# Patient Record
Sex: Female | Born: 1955
Health system: Southern US, Community
[De-identification: ages and names within clinical notes are randomized; demographics above are authoritative.]

## PROBLEM LIST (undated history)

## (undated) DIAGNOSIS — I10 Essential (primary) hypertension: Secondary | ICD-10-CM

## (undated) DIAGNOSIS — I509 Heart failure, unspecified: Secondary | ICD-10-CM

## (undated) DIAGNOSIS — I712 Thoracic aortic aneurysm, without rupture, unspecified: Secondary | ICD-10-CM

## (undated) DIAGNOSIS — R011 Cardiac murmur, unspecified: Secondary | ICD-10-CM

## (undated) DIAGNOSIS — Z8041 Family history of malignant neoplasm of ovary: Secondary | ICD-10-CM

## (undated) DIAGNOSIS — I700 Atherosclerosis of aorta: Secondary | ICD-10-CM

## (undated) DIAGNOSIS — D128 Benign neoplasm of rectum: Secondary | ICD-10-CM

## (undated) DIAGNOSIS — N183 Chronic kidney disease, stage 3 unspecified: Secondary | ICD-10-CM

## (undated) DIAGNOSIS — F1020 Alcohol dependence, uncomplicated: Secondary | ICD-10-CM

## (undated) DIAGNOSIS — K5792 Diverticulitis of intestine, part unspecified, without perforation or abscess without bleeding: Secondary | ICD-10-CM

## (undated) DIAGNOSIS — M1990 Unspecified osteoarthritis, unspecified site: Secondary | ICD-10-CM

## (undated) DIAGNOSIS — U071 COVID-19: Secondary | ICD-10-CM

## (undated) DIAGNOSIS — N200 Calculus of kidney: Secondary | ICD-10-CM

## (undated) HISTORY — DX: COVID-19: U07.1

## (undated) HISTORY — DX: Calculus of kidney: N20.0

## (undated) HISTORY — PX: TUBAL LIGATION: SHX77

## (undated) HISTORY — DX: Family history of malignant neoplasm of ovary: Z80.41

## (undated) HISTORY — DX: Benign neoplasm of rectum: D12.8

## (undated) HISTORY — DX: Thoracic aortic aneurysm, without rupture: I71.2

## (undated) HISTORY — DX: Heart failure, unspecified: I50.9

## (undated) HISTORY — DX: Chronic kidney disease, stage 3 unspecified: N18.30

## (undated) HISTORY — DX: Atherosclerosis of aorta: I70.0

## (undated) HISTORY — DX: Alcohol dependence, uncomplicated: F10.20

## (undated) HISTORY — DX: Thoracic aortic aneurysm, without rupture, unspecified: I71.20

---

## 1998-06-21 ENCOUNTER — Emergency Department (HOSPITAL_COMMUNITY): Admission: EM | Admit: 1998-06-21 | Discharge: 1998-06-21 | Payer: Self-pay | Admitting: Emergency Medicine

## 1999-08-26 ENCOUNTER — Encounter: Payer: Self-pay | Admitting: Emergency Medicine

## 1999-08-26 ENCOUNTER — Emergency Department (HOSPITAL_COMMUNITY): Admission: EM | Admit: 1999-08-26 | Discharge: 1999-08-26 | Payer: Self-pay | Admitting: Emergency Medicine

## 2000-12-03 ENCOUNTER — Emergency Department (HOSPITAL_COMMUNITY): Admission: EM | Admit: 2000-12-03 | Discharge: 2000-12-03 | Payer: Self-pay

## 2000-12-03 ENCOUNTER — Encounter: Payer: Self-pay | Admitting: Emergency Medicine

## 2001-01-06 ENCOUNTER — Encounter: Payer: Self-pay | Admitting: Emergency Medicine

## 2001-01-06 ENCOUNTER — Inpatient Hospital Stay (HOSPITAL_COMMUNITY): Admission: EM | Admit: 2001-01-06 | Discharge: 2001-01-10 | Payer: Self-pay | Admitting: Emergency Medicine

## 2001-01-07 ENCOUNTER — Encounter: Payer: Self-pay | Admitting: Family Medicine

## 2001-01-07 ENCOUNTER — Encounter: Payer: Self-pay | Admitting: Emergency Medicine

## 2001-01-08 ENCOUNTER — Encounter: Payer: Self-pay | Admitting: Family Medicine

## 2001-01-12 ENCOUNTER — Encounter: Admission: RE | Admit: 2001-01-12 | Discharge: 2001-01-12 | Payer: Self-pay | Admitting: Family Medicine

## 2002-11-12 ENCOUNTER — Encounter: Payer: Self-pay | Admitting: Emergency Medicine

## 2002-11-12 ENCOUNTER — Emergency Department (HOSPITAL_COMMUNITY): Admission: EM | Admit: 2002-11-12 | Discharge: 2002-11-12 | Payer: Self-pay | Admitting: Emergency Medicine

## 2005-03-02 ENCOUNTER — Inpatient Hospital Stay (HOSPITAL_COMMUNITY): Admission: RE | Admit: 2005-03-02 | Discharge: 2005-03-08 | Payer: Self-pay | Admitting: Psychiatry

## 2005-03-02 ENCOUNTER — Ambulatory Visit: Payer: Self-pay | Admitting: Psychiatry

## 2005-06-06 ENCOUNTER — Inpatient Hospital Stay (HOSPITAL_COMMUNITY): Admission: EM | Admit: 2005-06-06 | Discharge: 2005-06-15 | Payer: Self-pay | Admitting: Emergency Medicine

## 2005-06-06 ENCOUNTER — Ambulatory Visit: Payer: Self-pay | Admitting: Internal Medicine

## 2005-06-06 IMAGING — CT CT ABDOMEN W/ CM
1 of 3 series · 14 of 32 positions shown, 19 images · IV contrast (omnipaque)
Comparison: none

CLINICAL DATA: Abdominal pain, nausea, and vomiting.
 ABDOMEN CT WITH CONTRAST:
TECHNIQUE: Multidetector CT imaging of the abdomen was performed following the standard protocol during bolus administration of intravenous contrast.   Oral contrast was also administered. 
 Contrast:  135 cc Omnipaque 300 IV.
TECHNIQUE: Multidetector CT imaging of the pelvis was performed following the standard protocol during bolus administration of intravenous contrast.   Oral contrast was also administered.

[Series 3: abd/pelv with 5.0 b31f st · axial · 0.86mm/px · z∈[-438,-62]mm · 14 of 85 slices shown, 19 images]
[im 5/85  soft-tissue]
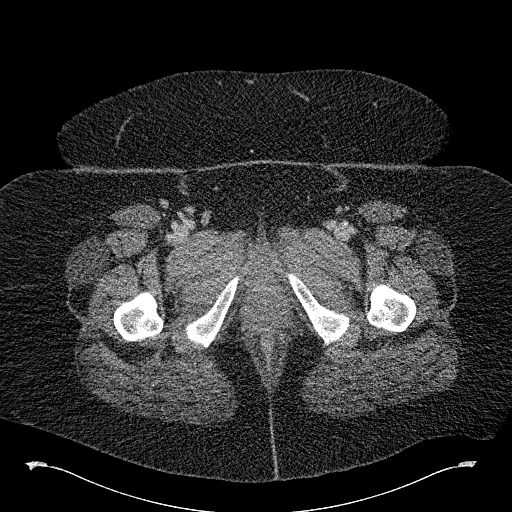
[im 5/85  bone]
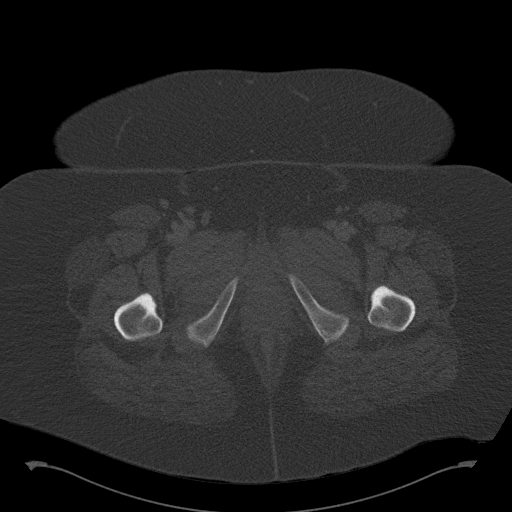
[im 13/85  soft-tissue]
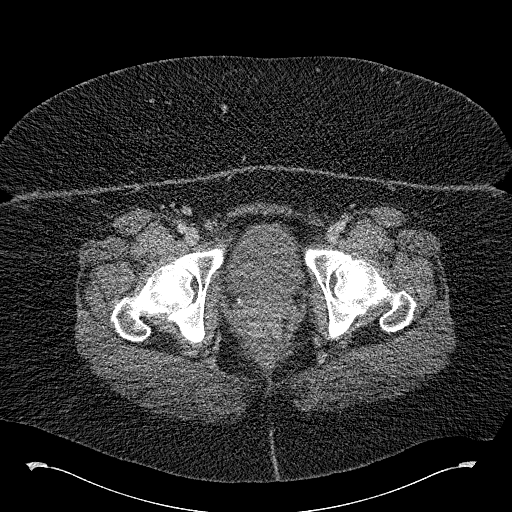
[im 17/85  soft-tissue]
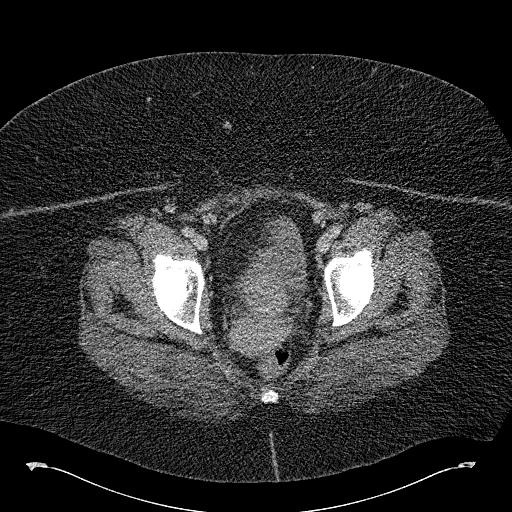
[im 26/85  soft-tissue]
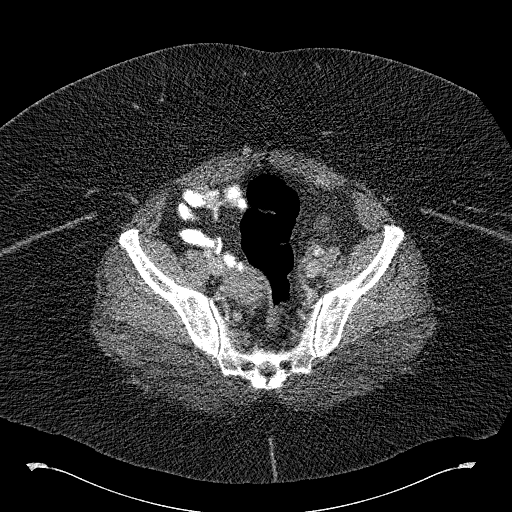
[im 30/85  soft-tissue]
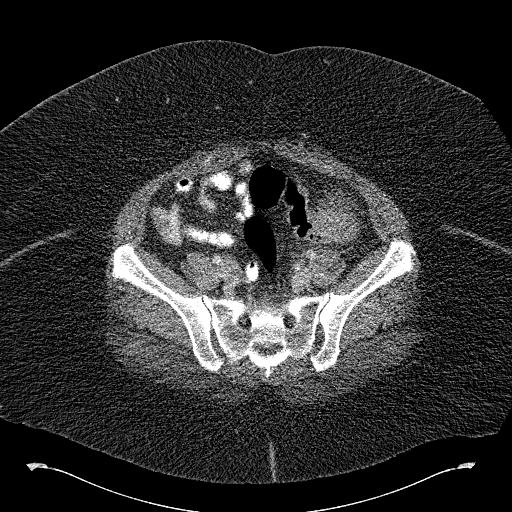
[im 38/85  soft-tissue]
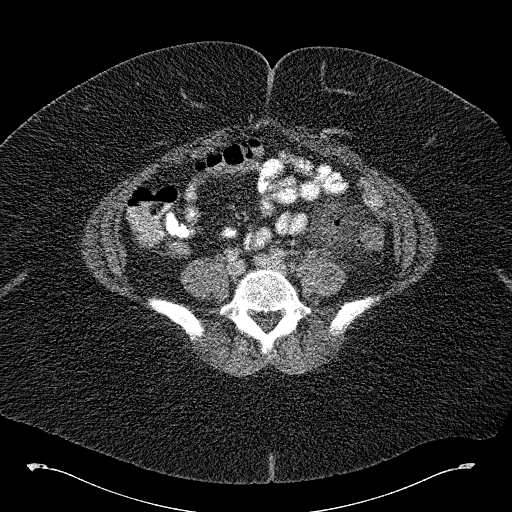
[im 43/85  soft-tissue]
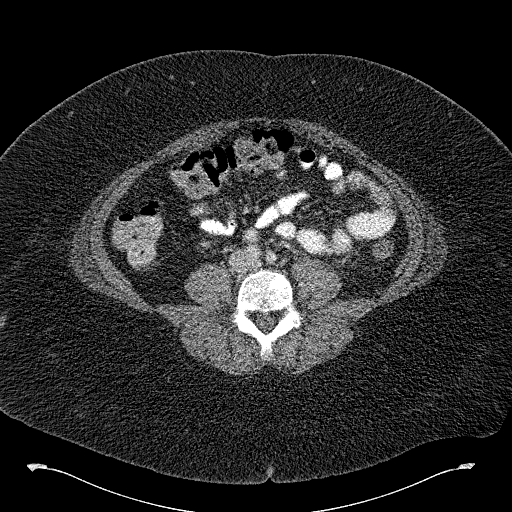
[im 47/85  soft-tissue]
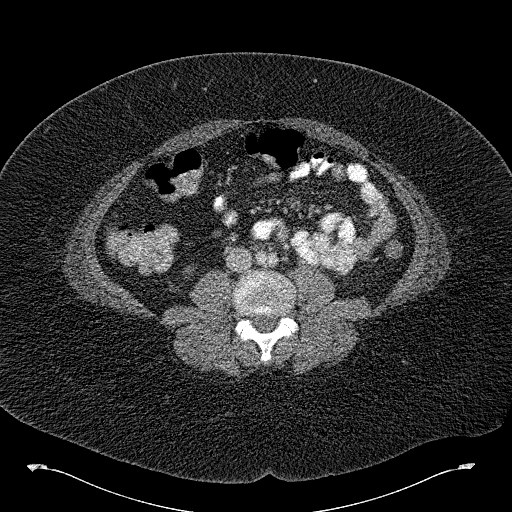
[im 55/85  soft-tissue]
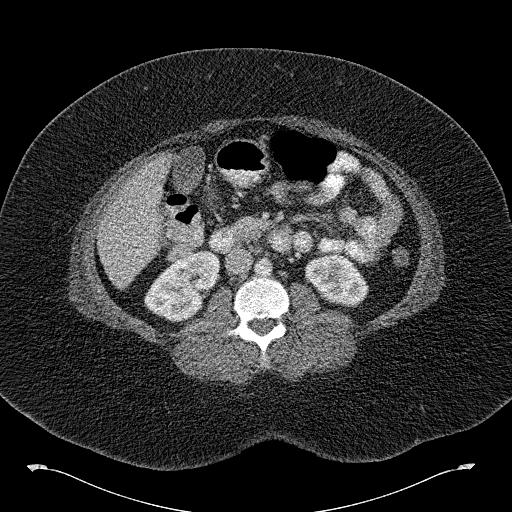
[im 55/85  bone]
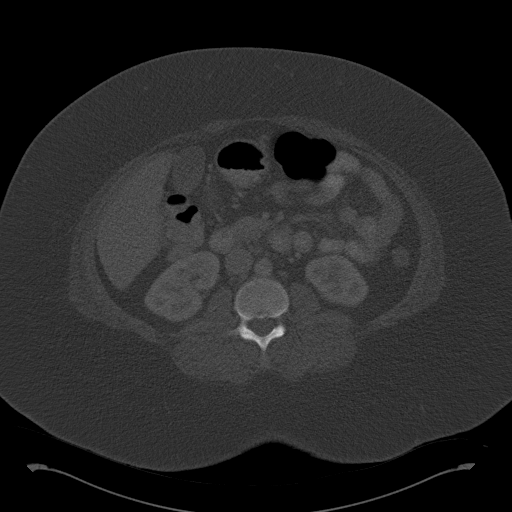
[im 59/85  soft-tissue]
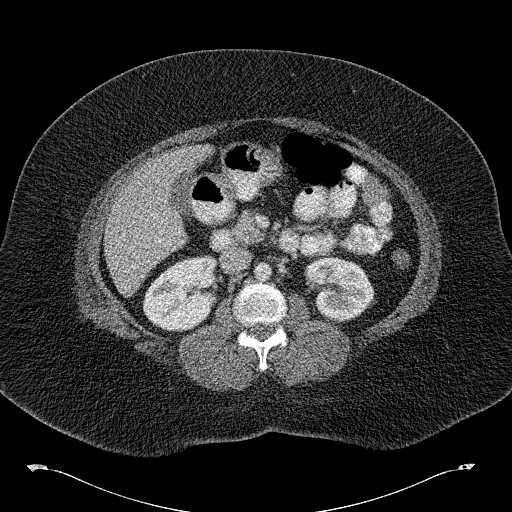
[im 68/85  soft-tissue]
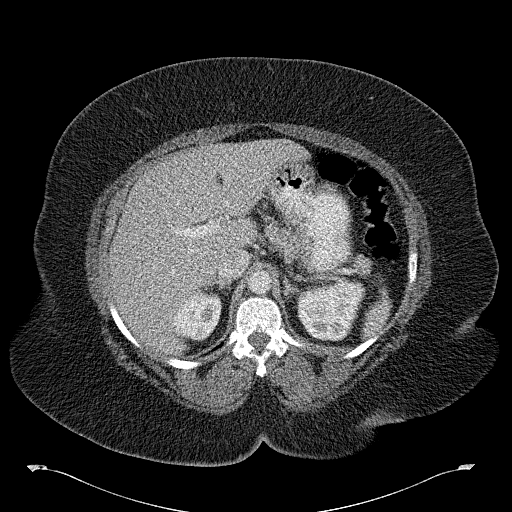
[im 68/85  lung]
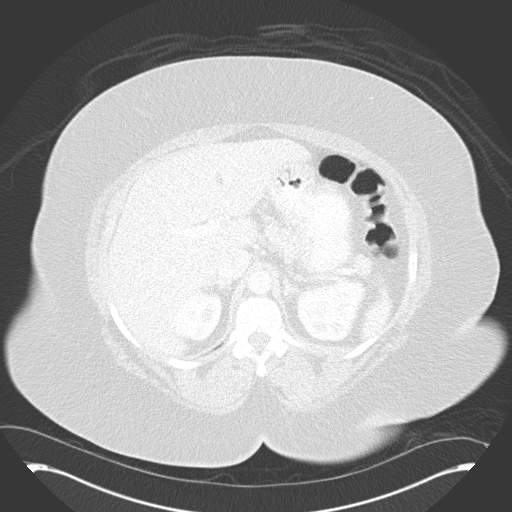
[im 72/85  soft-tissue]
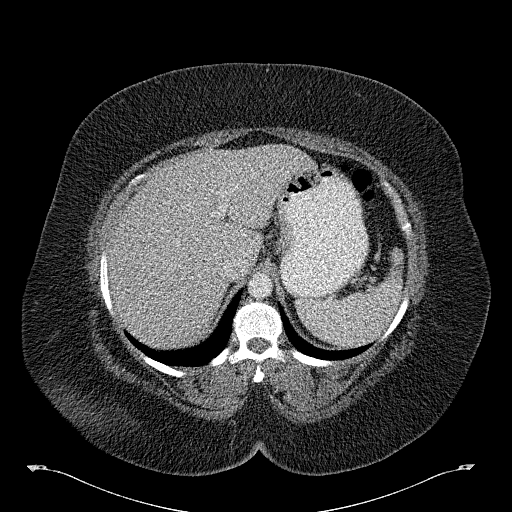
[im 72/85  lung]
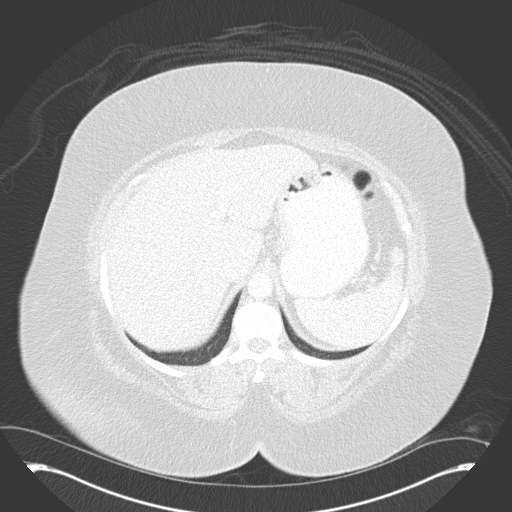
[im 76/85  lung]
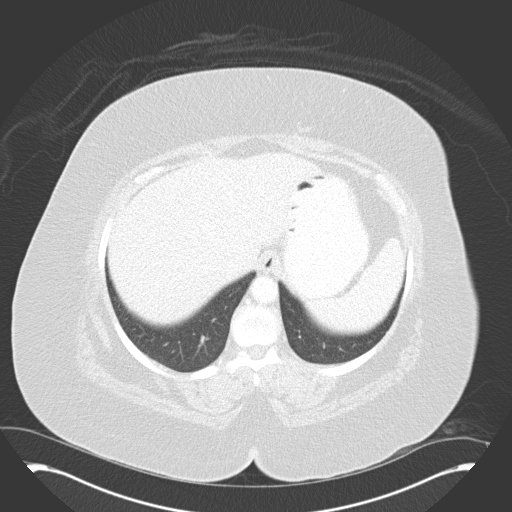
[im 80/85  soft-tissue]
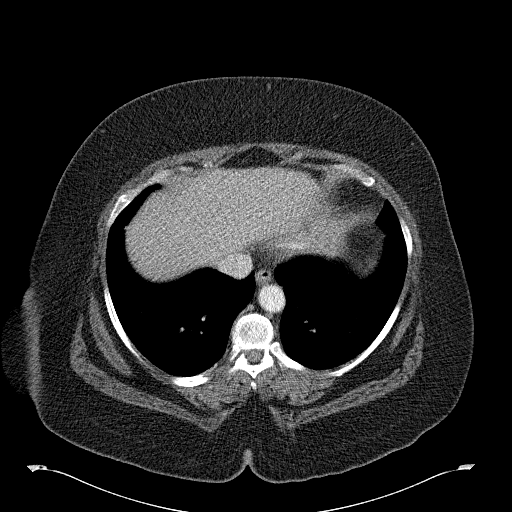
[im 80/85  lung]
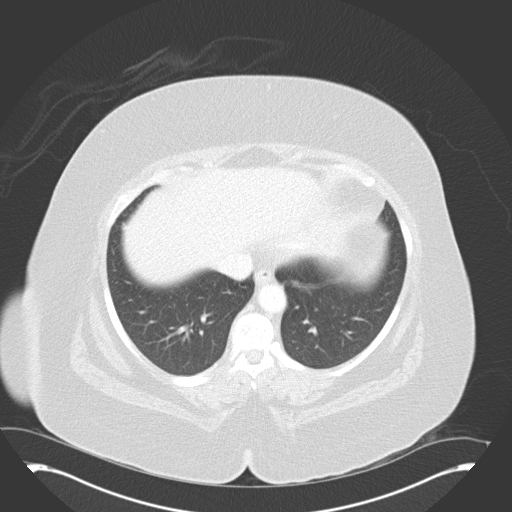

[14 of 32 positions shown; findings below may reference images not displayed]

FINDINGS: The lung bases are clear.  The study is somewhat degraded by the patient?s large body habitus.  The liver appears grossly normal.  No definite gallstones are seen.  The pancreas is normal as are the adrenal glands and the spleen.  The kidneys enhance normally and on delayed images the pelvocaliceal systems appear normal.  The abdominal aorta is normal in caliber.
IMPRESSION: Negative CT of the abdomen.  The study is somewhat degraded by the patient?s large body habitus.
 PELVIS CT WITH CONTRAST:
FINDINGS: Scans were continued through the pelvis after oral and IV contrast media were given.  The appendix is not seen.  The terminal ileum appears normal.  The uterus is normal in size.  The urinary bladder is decompressed and cannot be evaluated.  No pelvic mass or adenopathy is seen.  There is strandiness of the pericolonic fat planes in the left lower quadrant and left pelvis consistent with diverticulitis of the distal descending and proximal sigmoid colon.  No evidence of abscess is seen.  There is some extraluminal air present however.  Follow-up is recommended to ensure that this area clears and a neoplasm is not present.
IMPRESSION: Probable diverticulitis of the distal descending and proximal sigmoid colon with some extraluminal air.  No abscess is seen.  Suggest follow-up as noted above.

## 2005-06-09 IMAGING — CR DG ABDOMEN ACUTE W/ 1V CHEST
3 series · 3 of 3 positions shown · non-contrast
Comparison: none

CLINICAL DATA: Abdominal pain, diverticulitis.  
 ACUTE ABDOMINAL SERIES:  
 Chest x-ray is compared to a study from [DATE].  Heart size is upper normal.  There is no heart failure, infiltrate or effusion.  
 Flat and upright views of the abdomen reveal some contrast in the colon from a recent CT scan.  Nonobstructive bowel gas pattern and no free air.

[w chest pa]
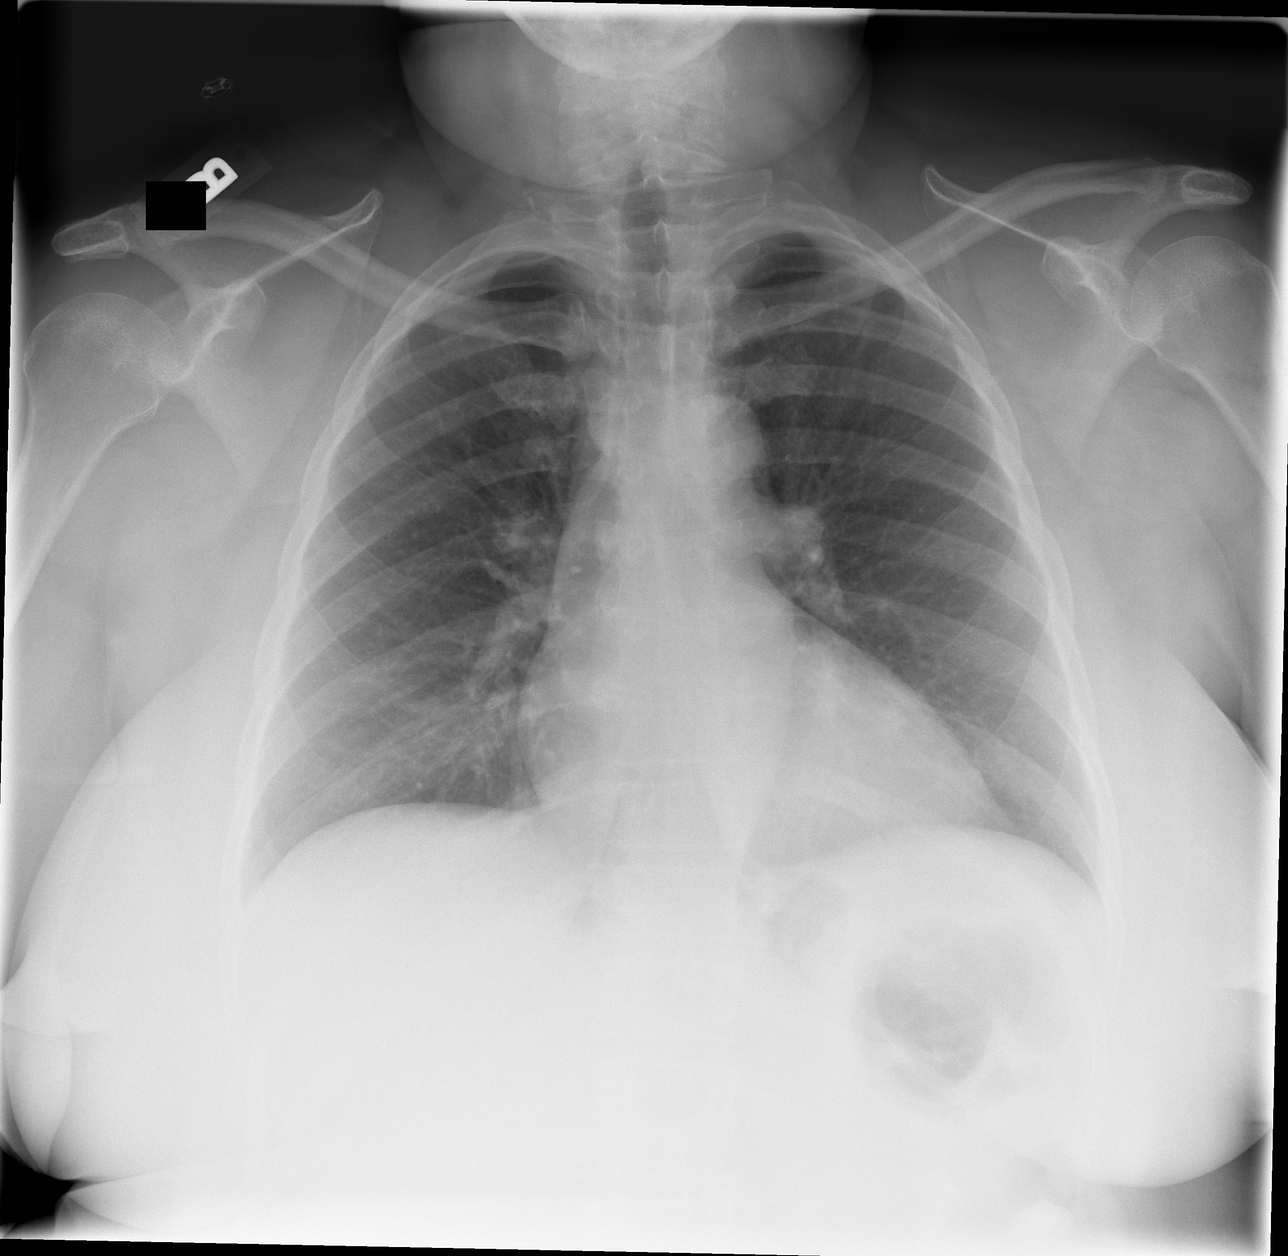

[w abdomen upright]
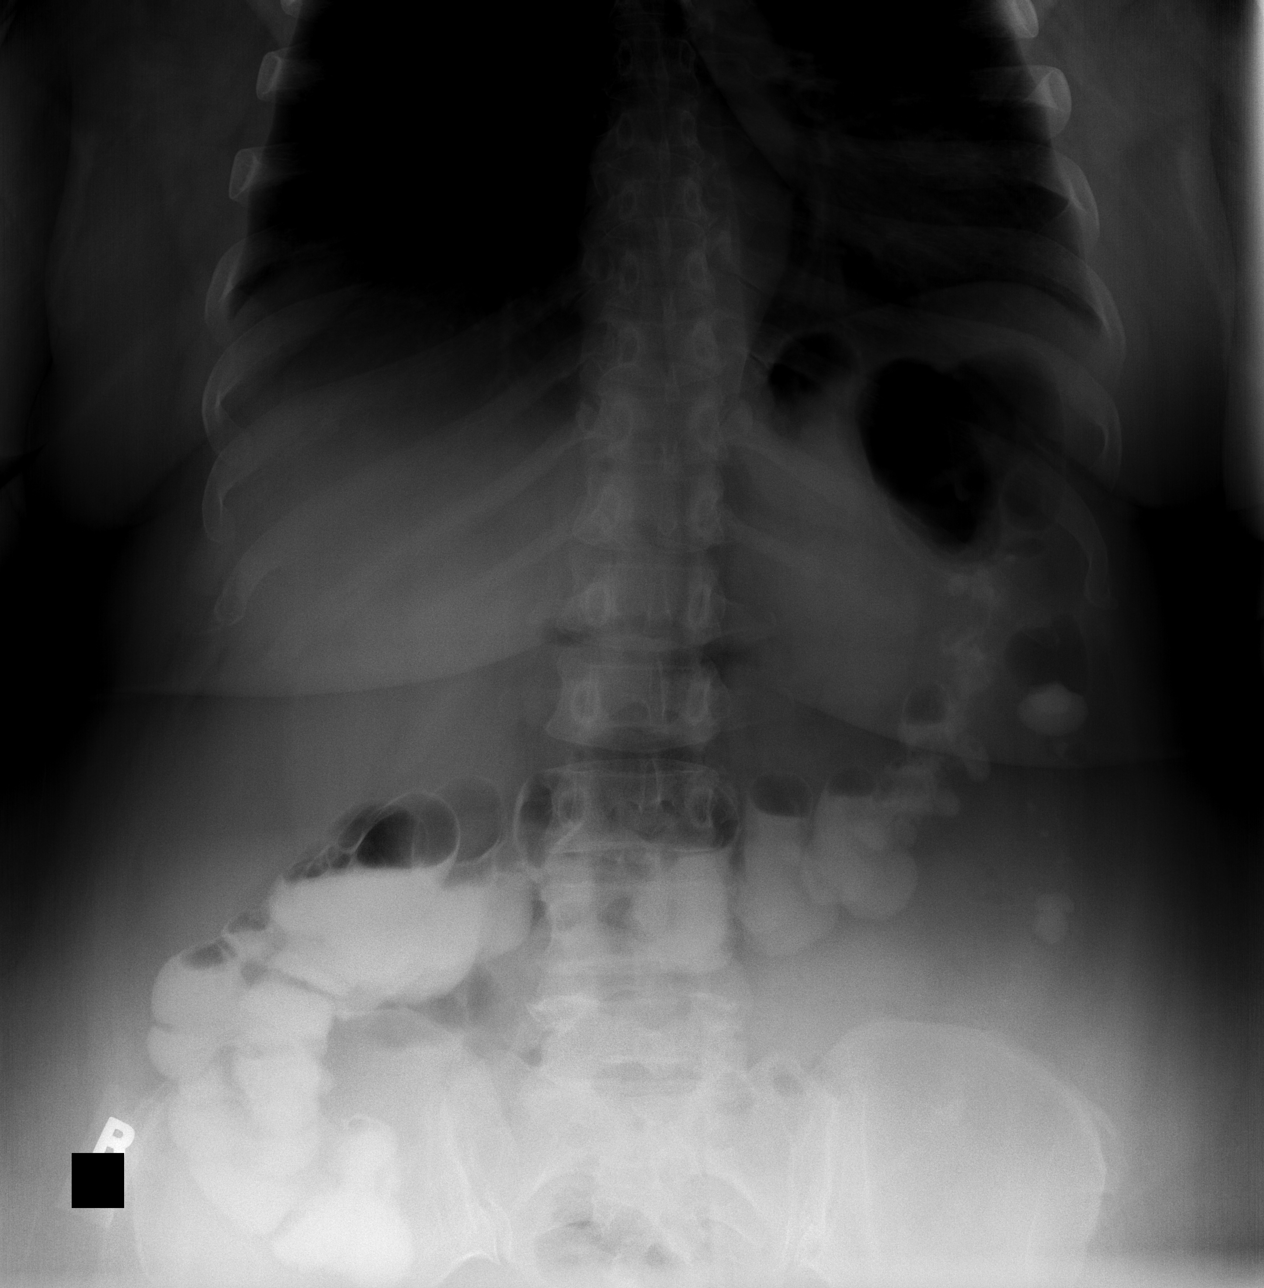

[t abdomen supine]
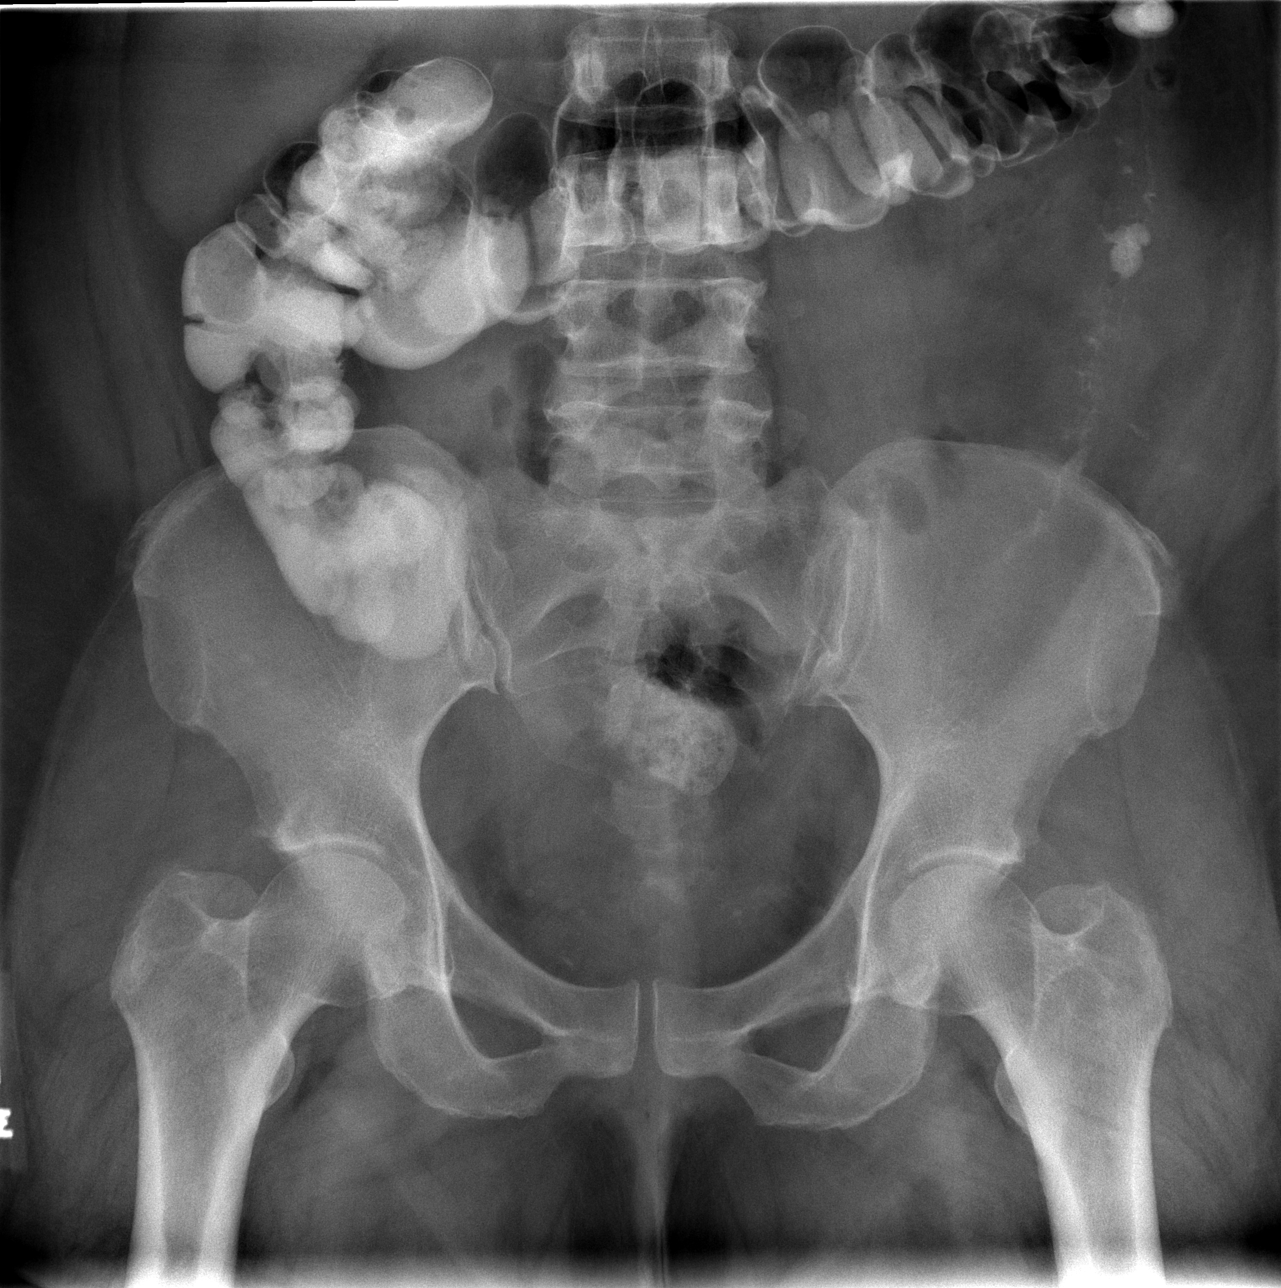

[3 of 3 positions shown; findings below may reference images not displayed]

IMPRESSION: No acute abnormality.

## 2005-06-10 IMAGING — CT CT ABDOMEN W/ CM
1 of 2 series · 15 of 32 positions shown, 19 images · IV contrast (agent unspecified)
Comparison: none

Clinical data colon diverticulitis, abdominal pain

CT abdomen with contrast:
Multidetector helical CT after 125  ml [3N] IV.
Comparison [DATE]. Visualized lung bases clear. Unremarkable liver,
gallbladder, spleen, adrenal glands, right kidney, pancreas, aorta, gallbladder.
There is a subcentimeter parenchymal calcification in the interpolar region of
the left kidney which is stable since CT's dating back to [DATE]. Small bowel
decompressed. No free air. No ascites.

[Series 2: routine abdomen · axial · 0.80mm/px · z∈[-443,-43]mm · 15 of 88 slices shown, 19 images]
[im 4/88  soft-tissue]
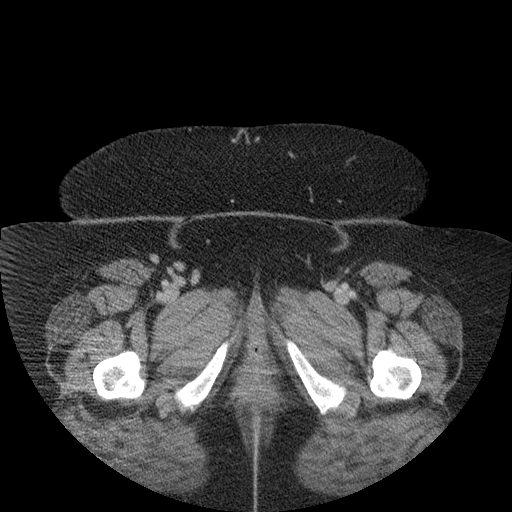
[im 4/88  bone]
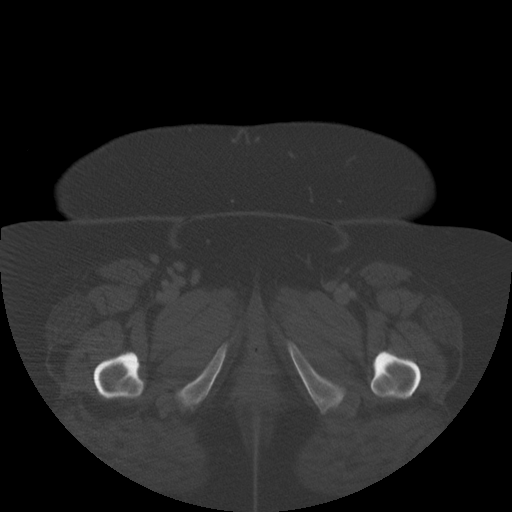
[im 12/88  soft-tissue]
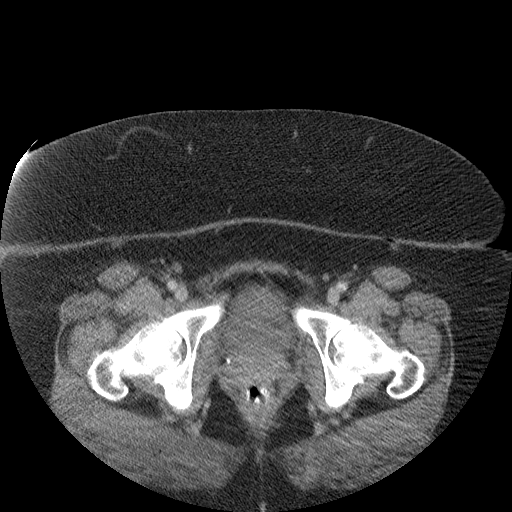
[im 19/88  soft-tissue]
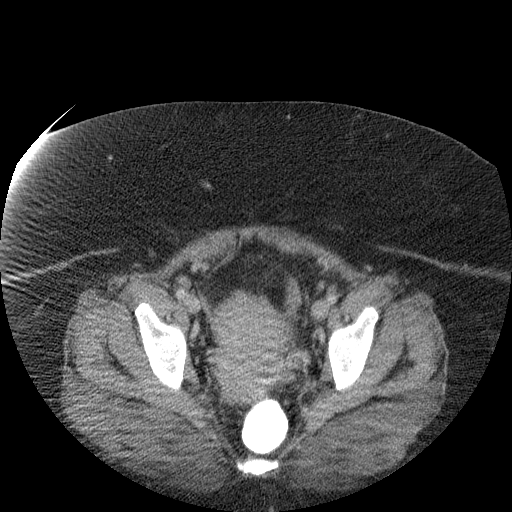
[im 23/88  soft-tissue]
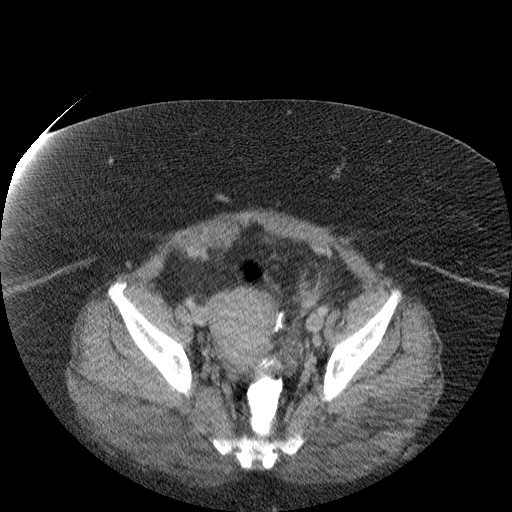
[im 31/88  soft-tissue]
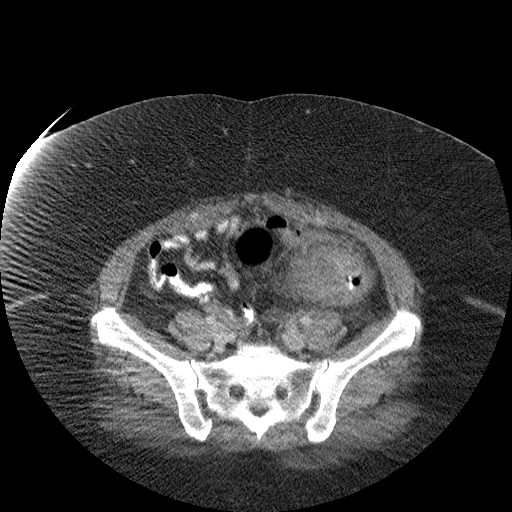
[im 38/88  soft-tissue]
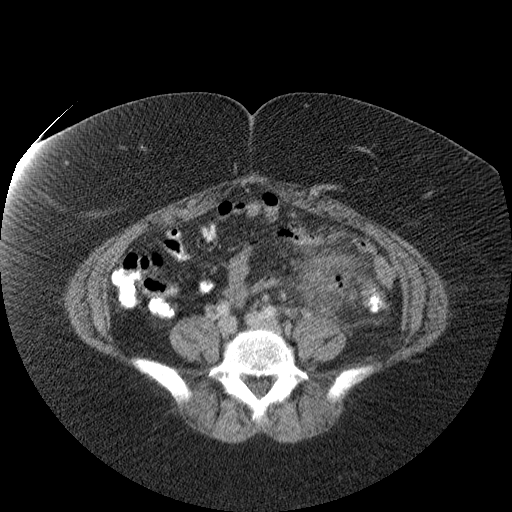
[im 46/88  soft-tissue]
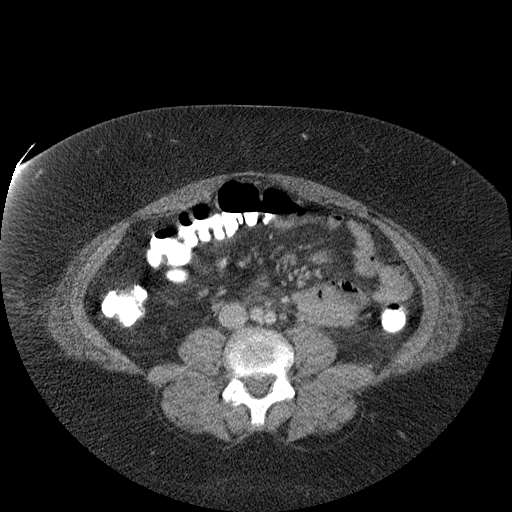
[im 50/88  soft-tissue]
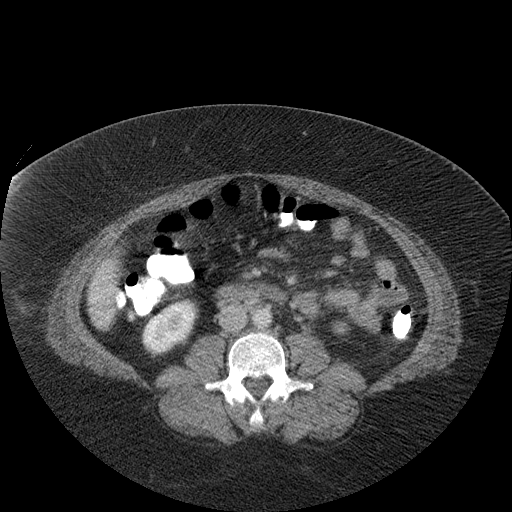
[im 57/88  soft-tissue]
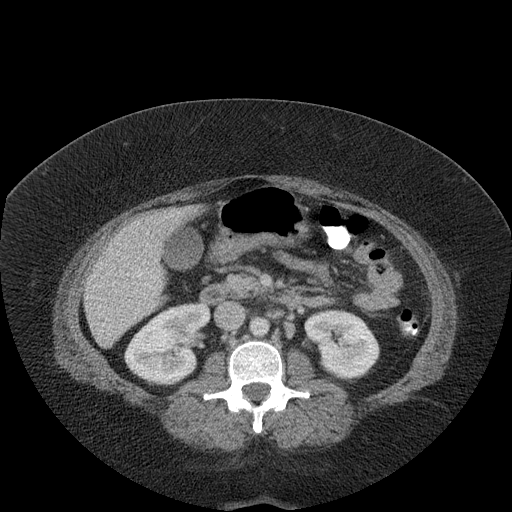
[im 57/88  bone]
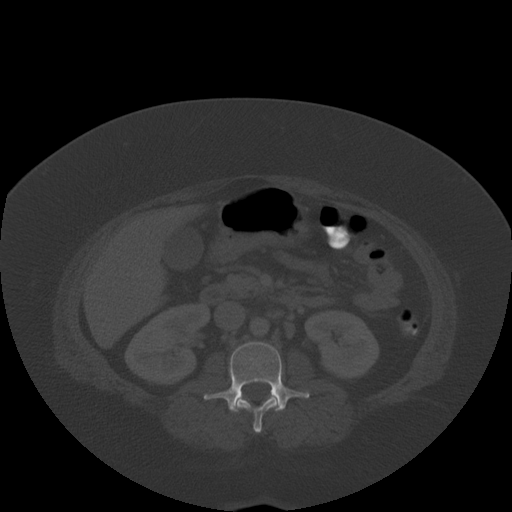
[im 65/88  soft-tissue]
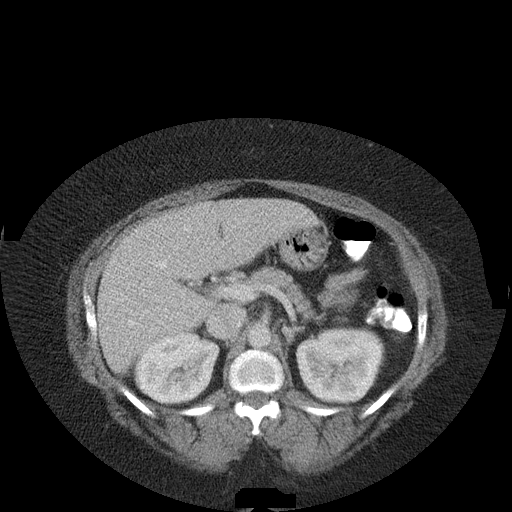
[im 69/88  soft-tissue]
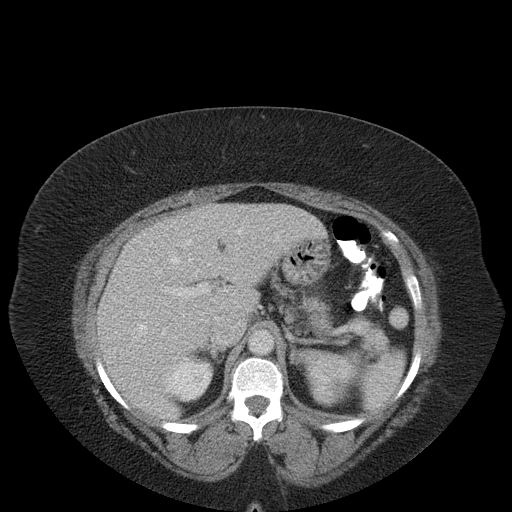
[im 72/88  lung]
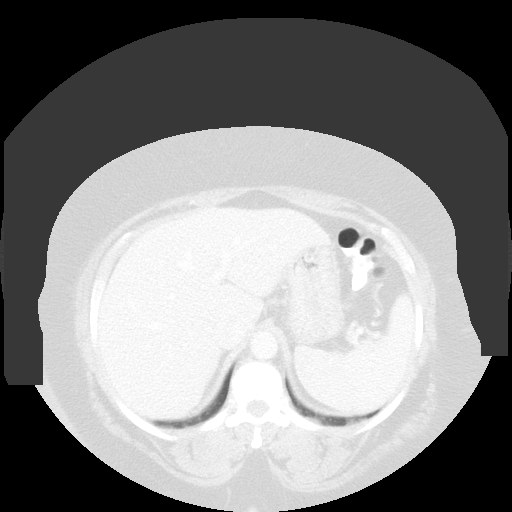
[im 76/88  soft-tissue]
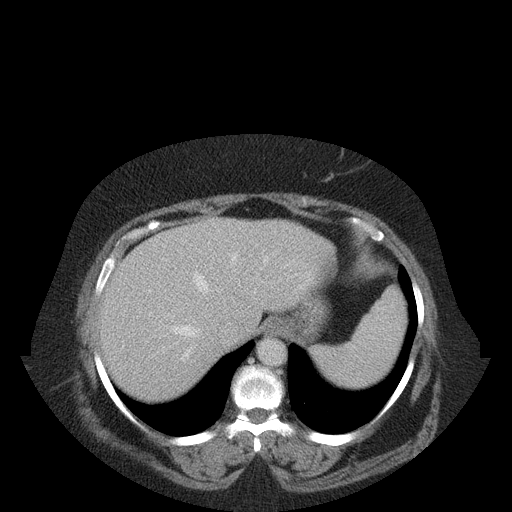
[im 76/88  lung]
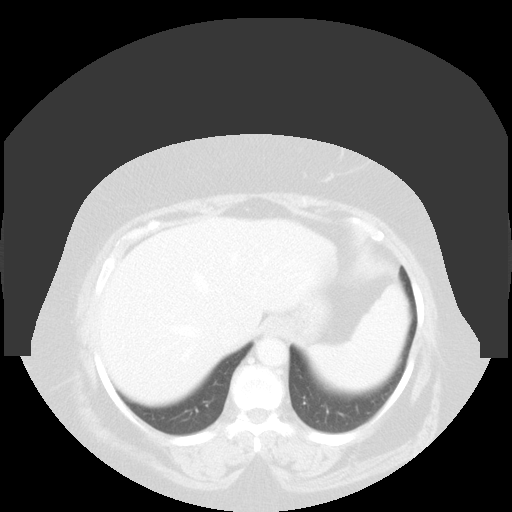
[im 80/88  lung]
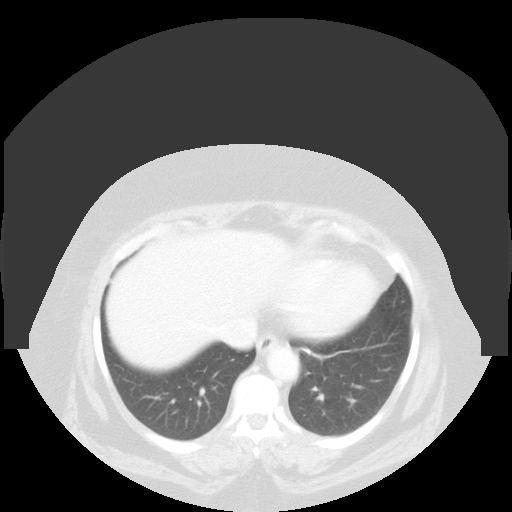
[im 84/88  soft-tissue]
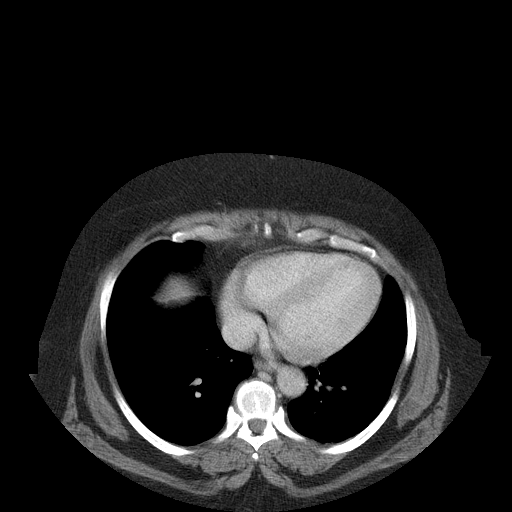
[im 84/88  lung]
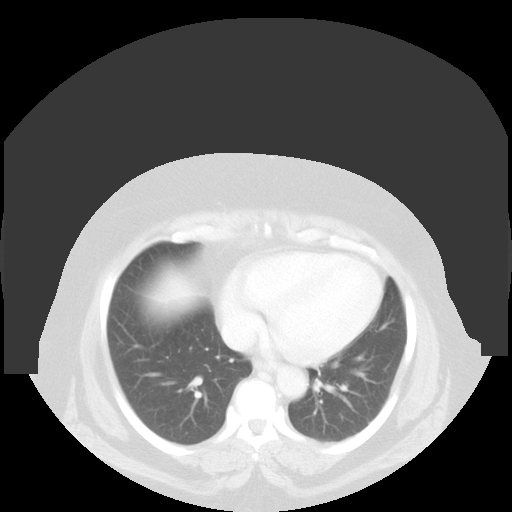

[15 of 32 positions shown; findings below may reference images not displayed]

IMPRESSION: 1. Unremarkable CT abdomen. Stable appearance since [DATE].

CT pelvis with contrast:

Ascending, transverse, and proximal descending portions of the colon are
unremarkable. There is irregular circumferential wall thickening in the distal
descending colon extending into the proximal sigmoid colon. There are adjacent
inflammatory/edematous changes. There is an ill-defined extraluminal phlegmonous
collection measuring approximately 3.5 cm in diameter containing scattered gas
bubbles probably developing abscess. More distal aspect of sigmoid colon and
rectum are nondistended, unremarkable. Urinary bladder is incompletely
distended. Uterus and adnexal regions unremarkable.
IMPRESSION: 1. Probable diverticulitis with developing abscess near the descending/sigmoid
junction. The phlegmonous/inflammatory process has increased since previous
scan, but does not appear to be a well defined drainable collection. Followup is
recommended as a perforated carcinoma can have a similar appearance.

## 2005-06-10 IMAGING — CR DG ABDOMEN ACUTE W/ 1V CHEST
5 series · 5 of 5 positions shown · non-contrast
Comparison: [DATE] and [DATE].

CLINICAL DATA: Abdominal pain, diverticulitis. 
 ACUTE ABDOMINAL SERIES - 4 VIEW:

[view not recorded (1 of 5)]
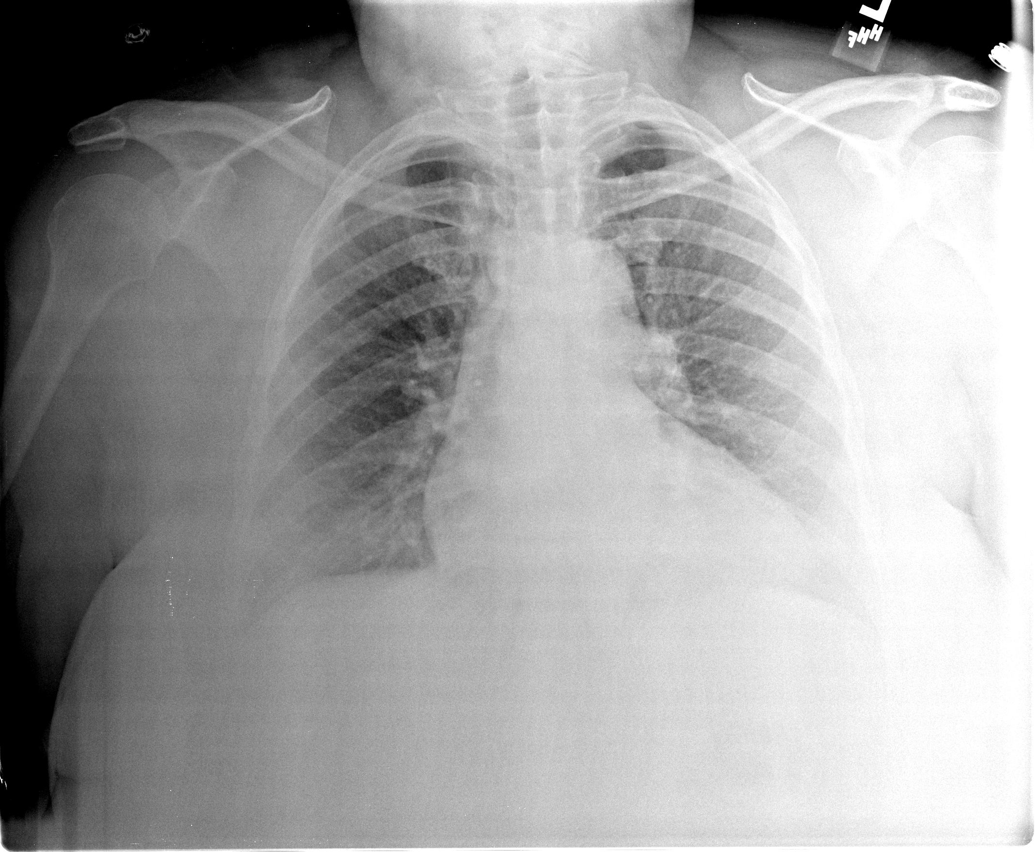

[view not recorded (2 of 5)]
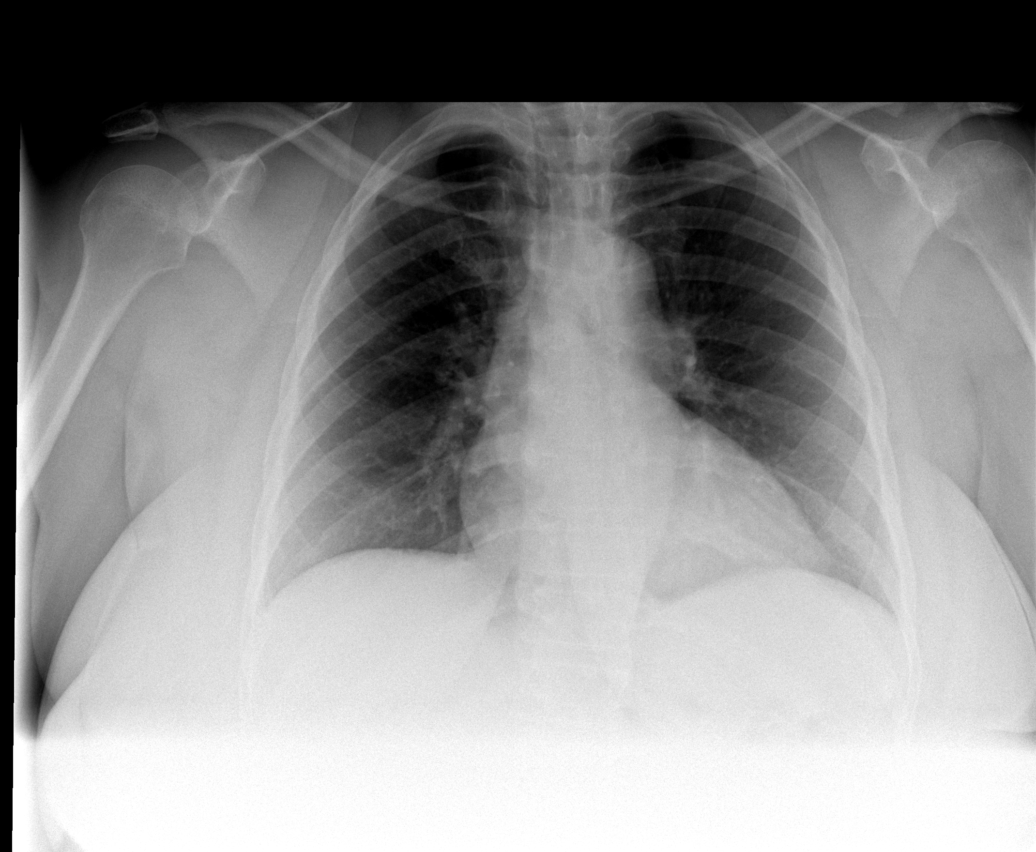

[view not recorded (3 of 5)]
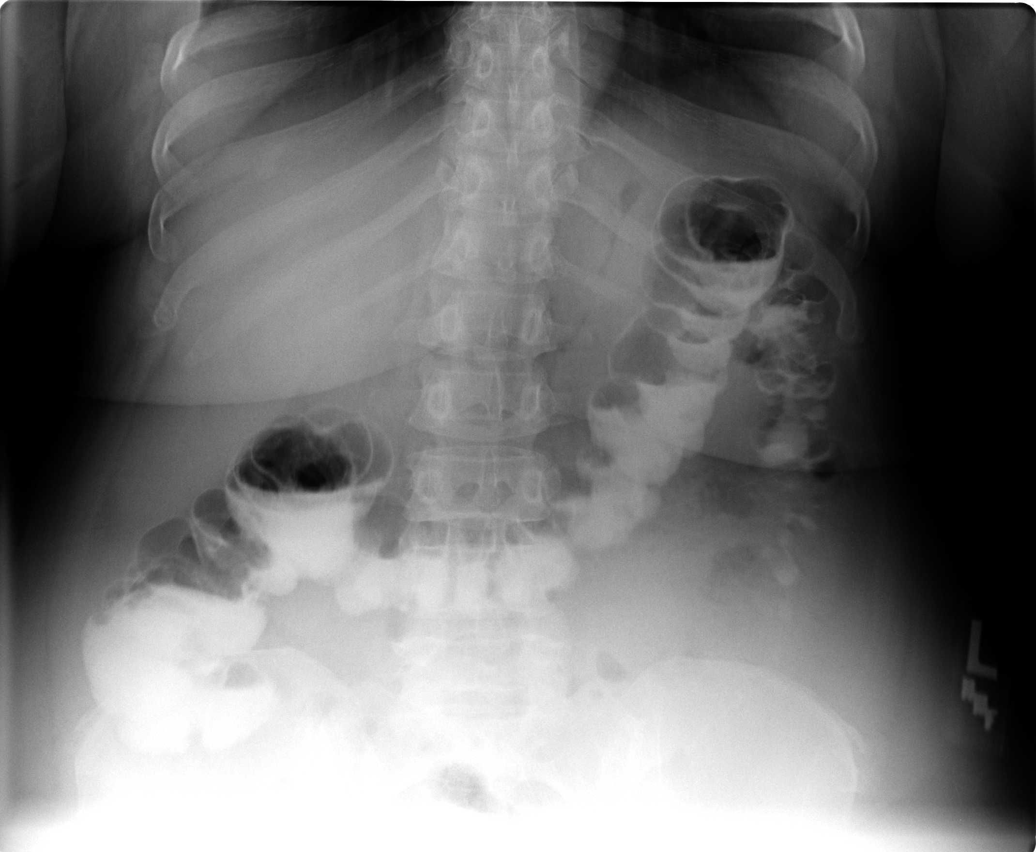

[view not recorded (4 of 5)]
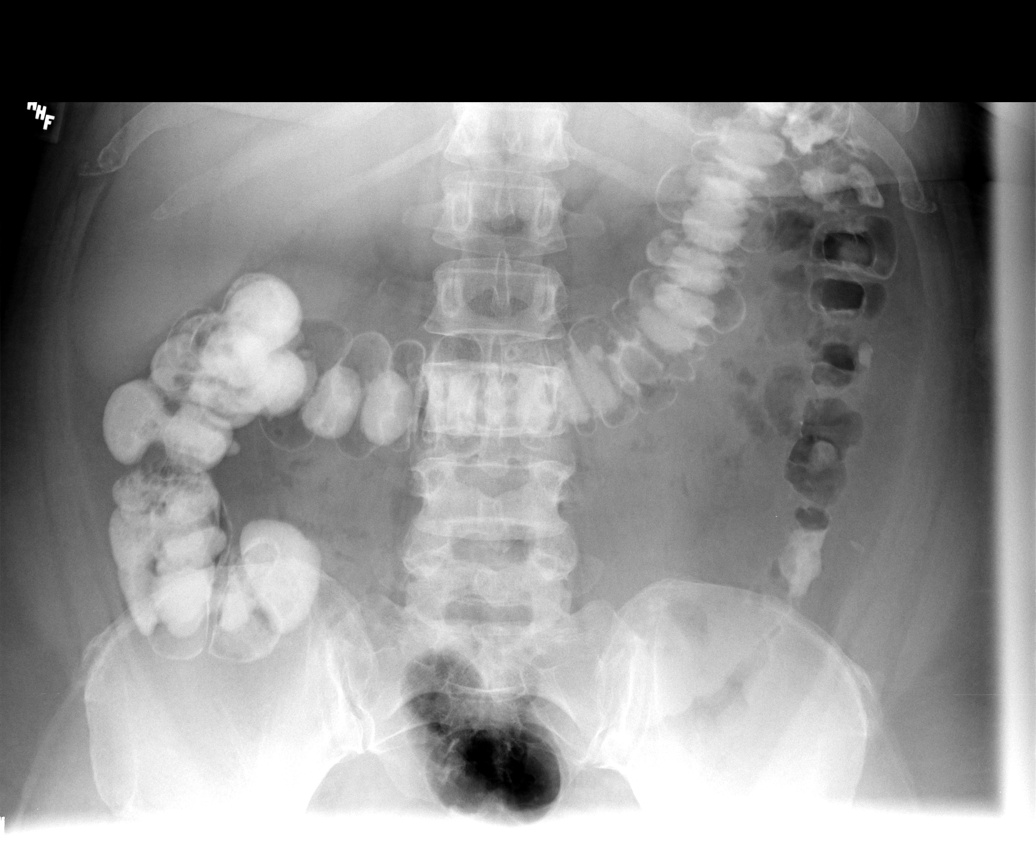

[view not recorded (5 of 5)]
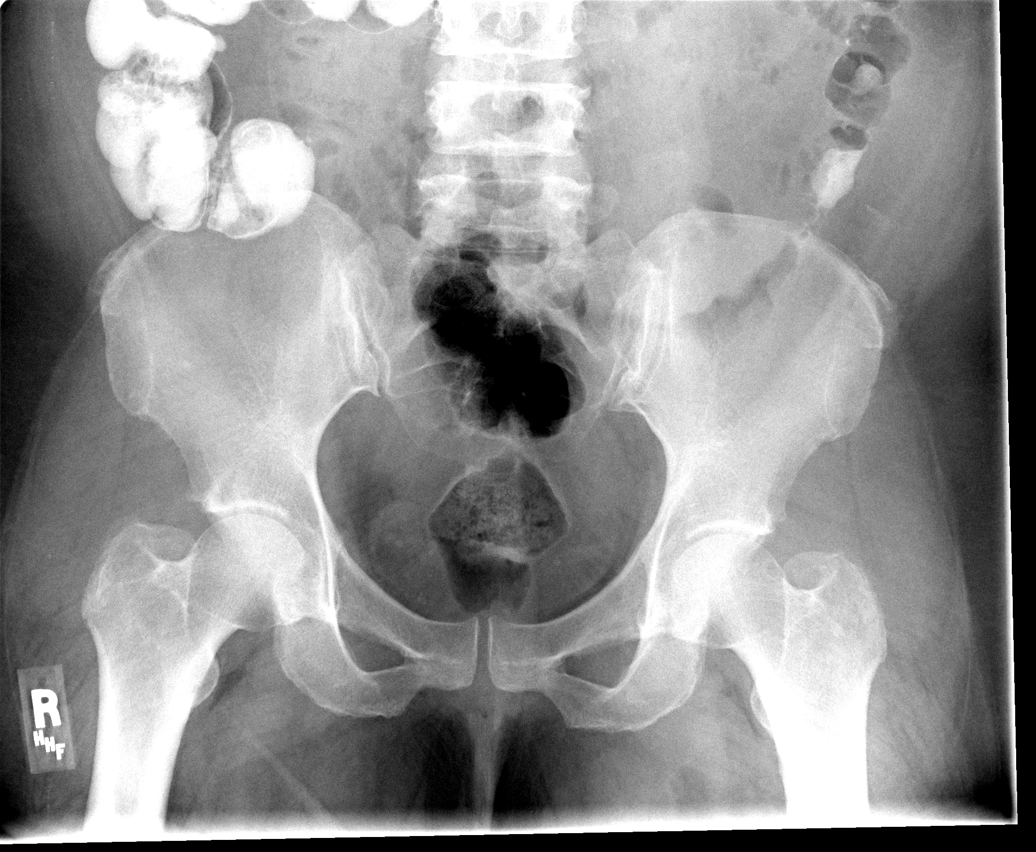

[5 of 5 positions shown; findings below may reference images not displayed]

FINDINGS: Chest:  The lungs remain clear.  Normal heart size.  No acute pneumonia, consolidation, effusion or pneumothorax.  No free air. 
 Abdomen:  No bowel obstruction, dilatation, or free air.  Contrast remains throughout the colon.
IMPRESSION: 1.  No acute findings in the chest or abdomen. 
 2. Residual CT contrast in the colon without dilatation.

## 2005-06-14 IMAGING — CT CT PELVIS W/ CM
1 of 3 series · 14 of 32 positions shown, 19 images · IV contrast (omnipaque)
Comparison: [DATE]

CLINICAL DATA: Diverticulitis.
 ABDOMEN CT WITH CONTRAST:
TECHNIQUE: Multidetector CT imaging of the abdomen was performed following the standard protocol during bolus administration of intravenous contrast.
 Contrast:  100 cc Omnipaque 300
TECHNIQUE: Multidetector CT imaging of the pelvis was performed following the standard protocol during bolus administration of intravenous contrast.

[Series 2: abd/pelv with 5.0 b31f st · axial · 0.75mm/px · z∈[-483,-108]mm · 14 of 87 slices shown, 19 images]
[im 6/87  soft-tissue]
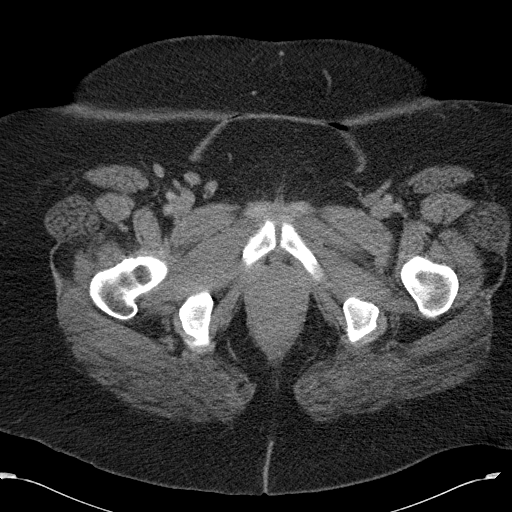
[im 6/87  bone]
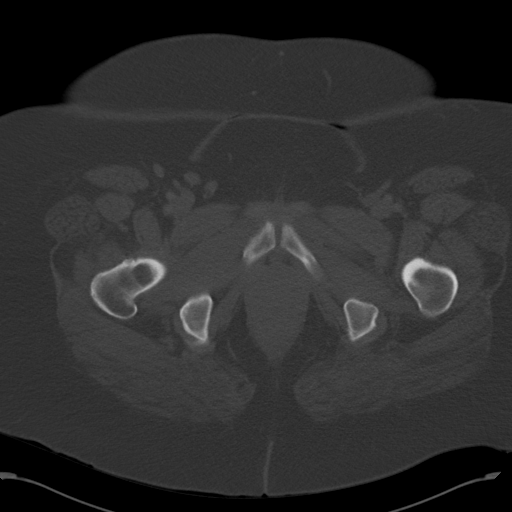
[im 11/87  soft-tissue]
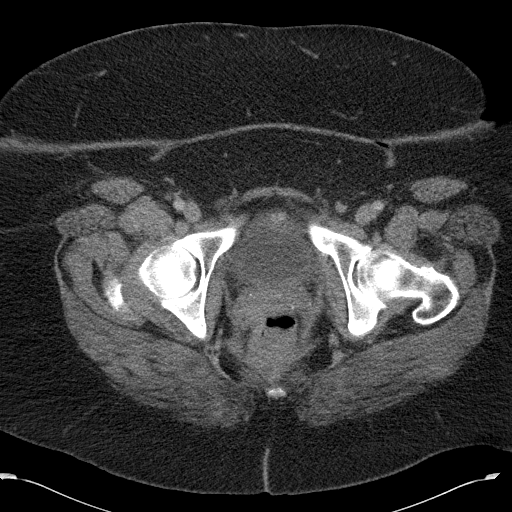
[im 21/87  soft-tissue]
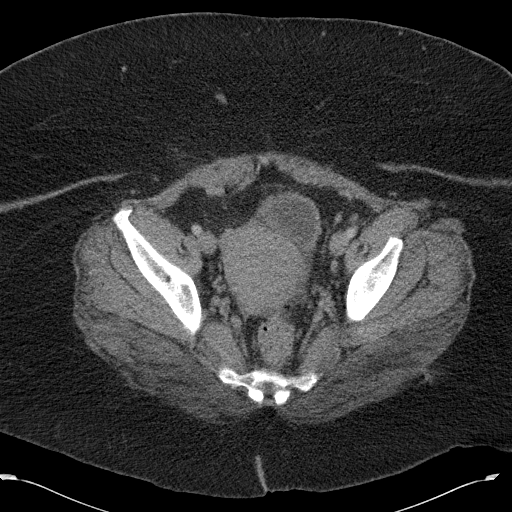
[im 26/87  soft-tissue]
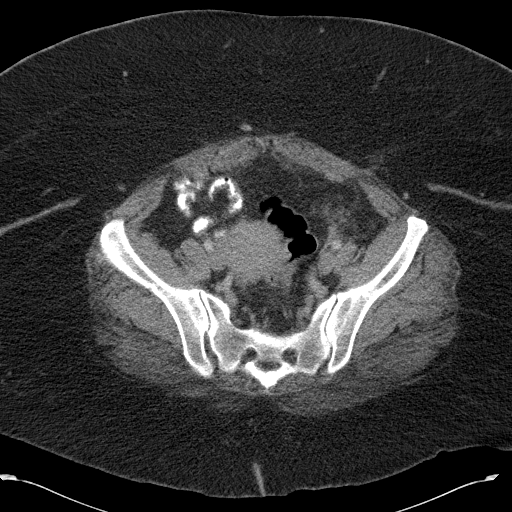
[im 31/87  soft-tissue]
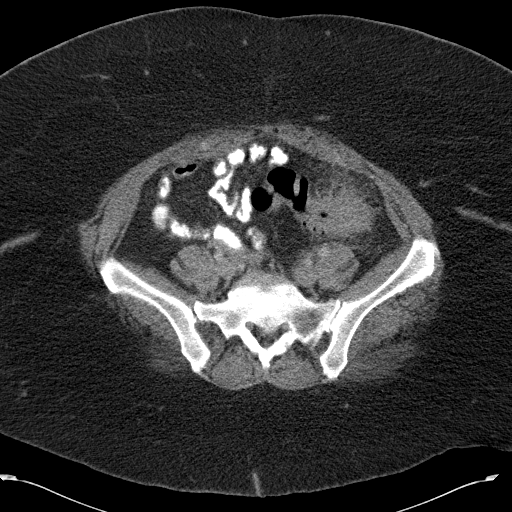
[im 36/87  soft-tissue]
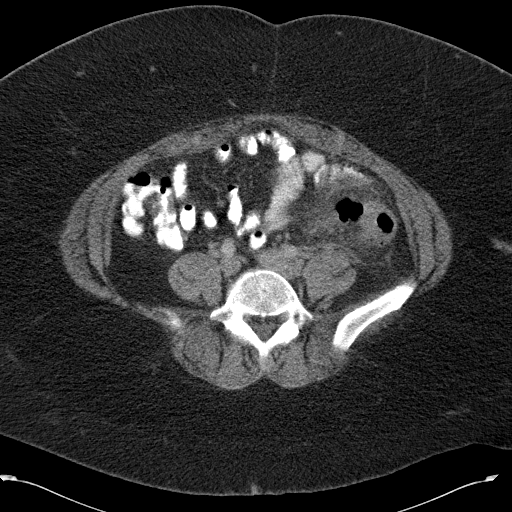
[im 46/87  soft-tissue]
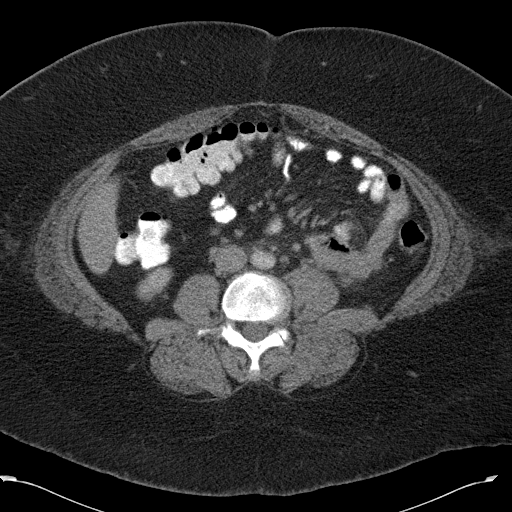
[im 51/87  soft-tissue]
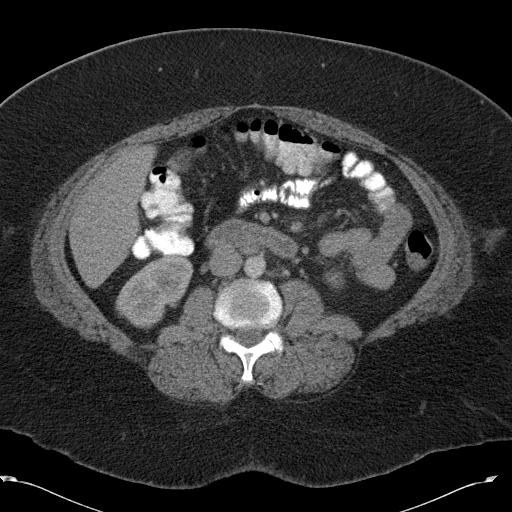
[im 56/87  soft-tissue]
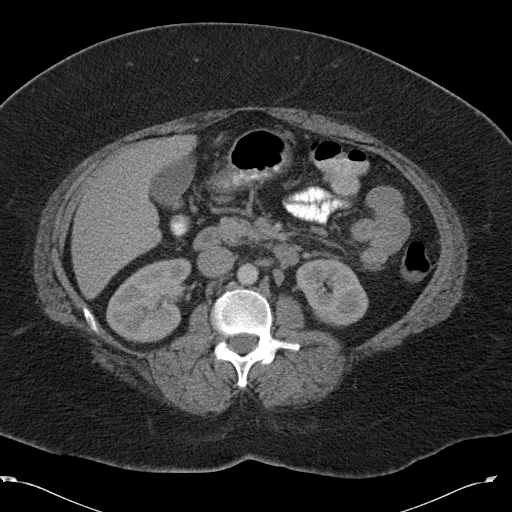
[im 56/87  bone]
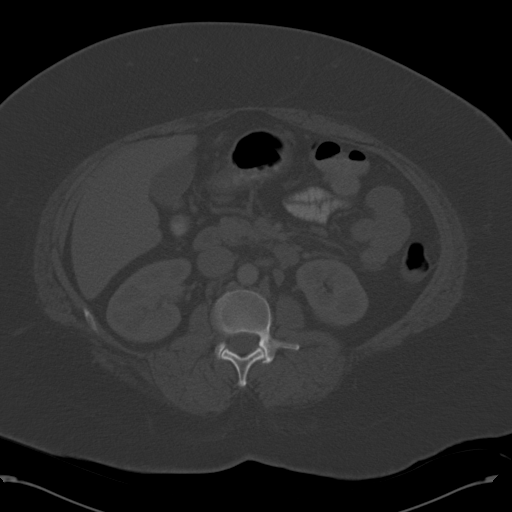
[im 61/87  soft-tissue]
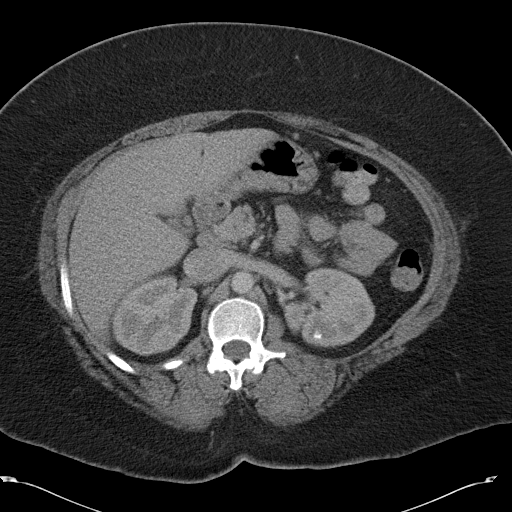
[im 66/87  soft-tissue]
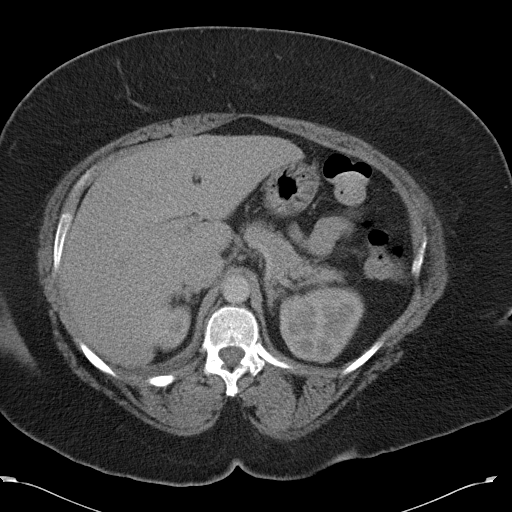
[im 66/87  lung]
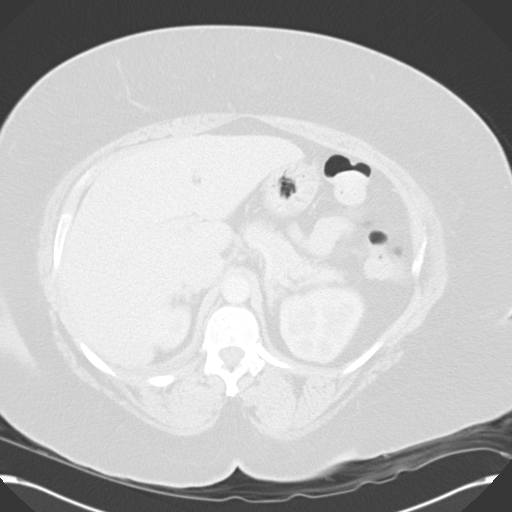
[im 71/87  lung]
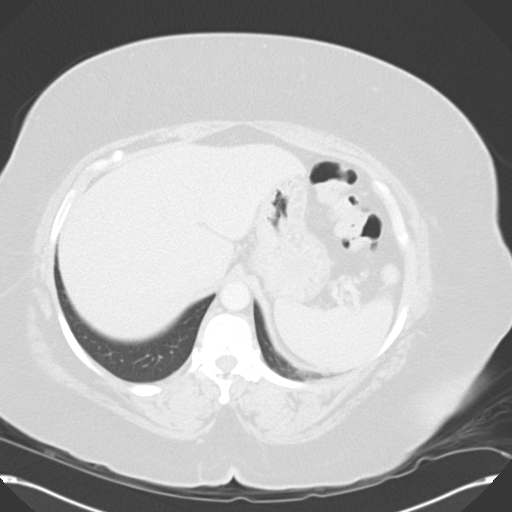
[im 76/87  soft-tissue]
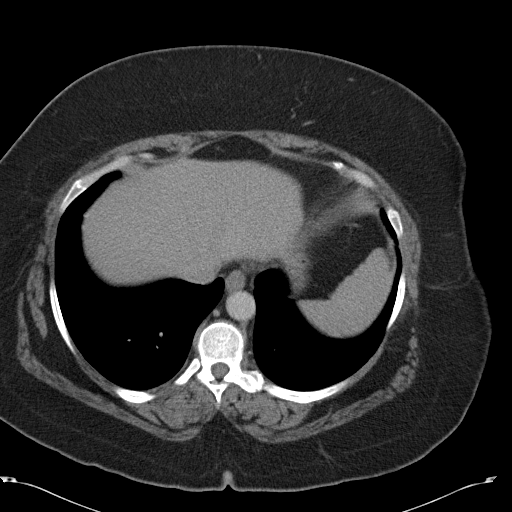
[im 76/87  lung]
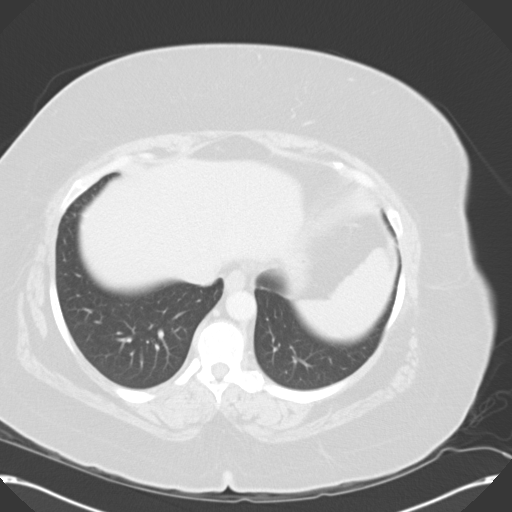
[im 81/87  soft-tissue]
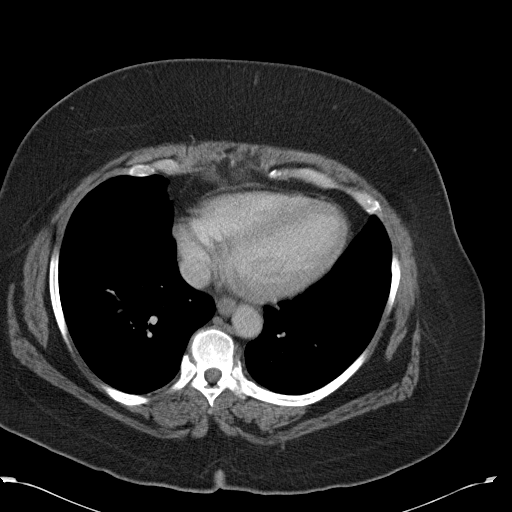
[im 81/87  lung]
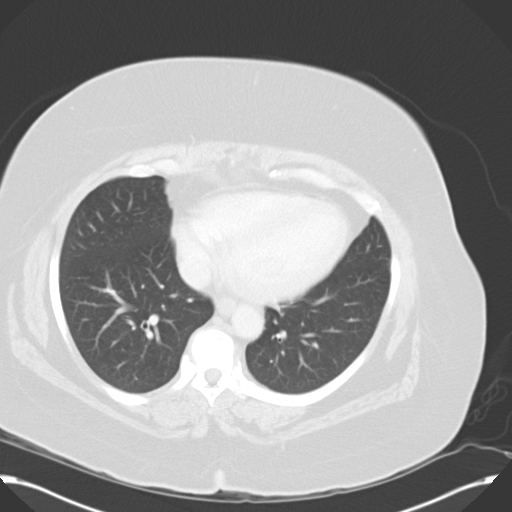

[14 of 32 positions shown; findings below may reference images not displayed]

FINDINGS: The liver, gallbladder, adrenal glands, and right kidney are unremarkable.  A somewhat peripheral calcification in the interpolar left kidney may be within a calyceal diverticulum.  No evidence of obstruction.  The spleen, pancreas, stomach, and small bowel are unremarkable.  Small retroperitoneal and mesenteric lymph nodes do not meet size criteria for pathologic enlargement.
IMPRESSION: 1.  No acute findings in the abdomen. 
 2.  Non-obstructing left renal stone, likely within a calyceal diverticulum.
 PELVIS CT WITH CONTRAST:
FINDINGS: Extensive pericolonic inflammatory changes and extra-luminal air are seen adjacent to the distal descending and sigmoid colon.  No discreet fluid collection to suggest abscess.  The overall appearance has improved since [DATE].  No free fluid.  Adjacent reactive lymph nodes are noted.  Small lymph nodes are seen along the iliac chains bilaterally within both inguinal regions.
IMPRESSION: Interval improvement in pericolonic inflammatory changes involving the distal descending and sigmoid colon.  There is extra-luminal air without discreet fluid collection to suggest abscess.  Adjacent reactive lymph nodes.

## 2005-06-22 ENCOUNTER — Ambulatory Visit: Payer: Self-pay | Admitting: Internal Medicine

## 2006-01-07 ENCOUNTER — Inpatient Hospital Stay (HOSPITAL_COMMUNITY): Admission: EM | Admit: 2006-01-07 | Discharge: 2006-01-14 | Payer: Self-pay | Admitting: Emergency Medicine

## 2006-01-07 IMAGING — US US ABDOMEN COMPLETE
1 series · 14 of 25 positions shown · non-contrast
Comparison: None.

CLINICAL DATA: Nausea and vomiting.  
 ABDOMEN ULTRASOUND:
TECHNIQUE: Complete abdominal ultrasound examination was performed including evaluation of the liver, gallbladder, bile ducts, pancreas, kidneys, spleen, IVC, and abdominal aorta.

[Series 1: unknown · 0.40mm/px · 14 of 78 slices shown]
[im 1/78]
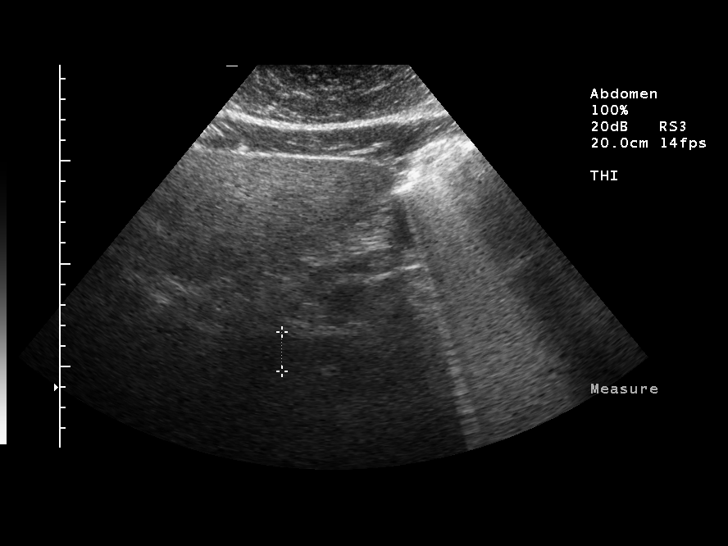
[im 7/78]
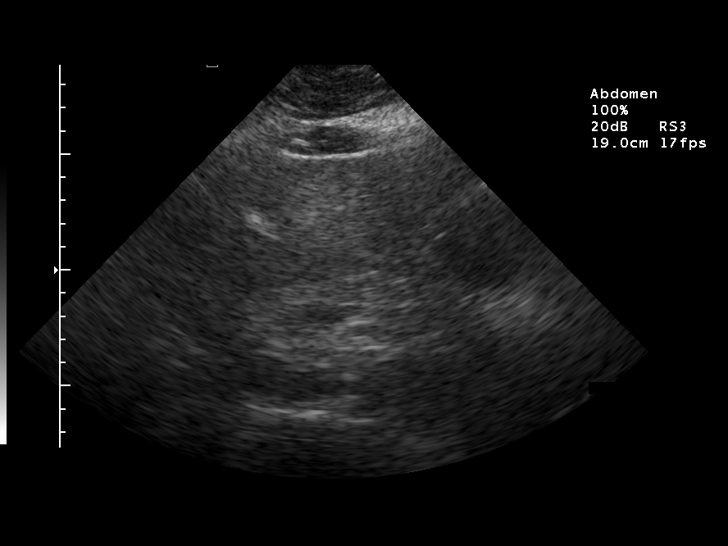
[im 13/78]
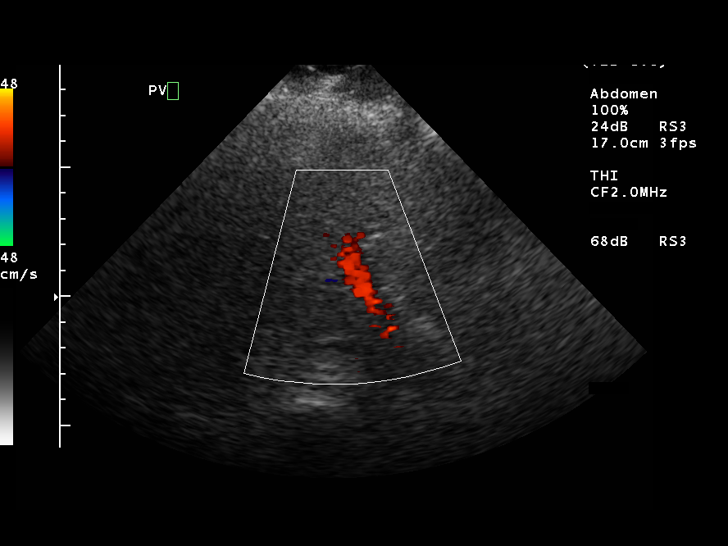
[im 20/78]
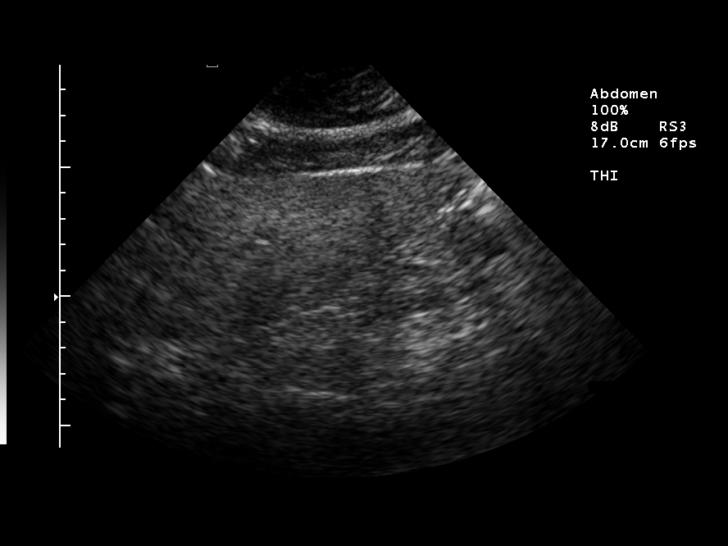
[im 26/78]
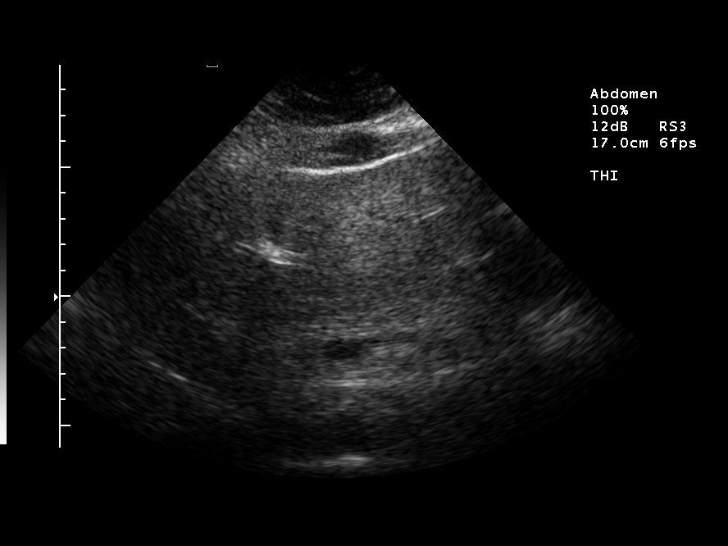
[im 29/78]
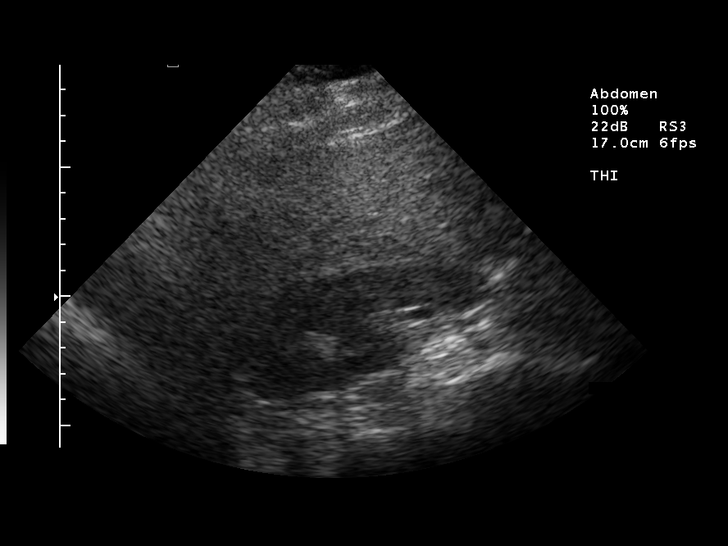
[im 36/78]
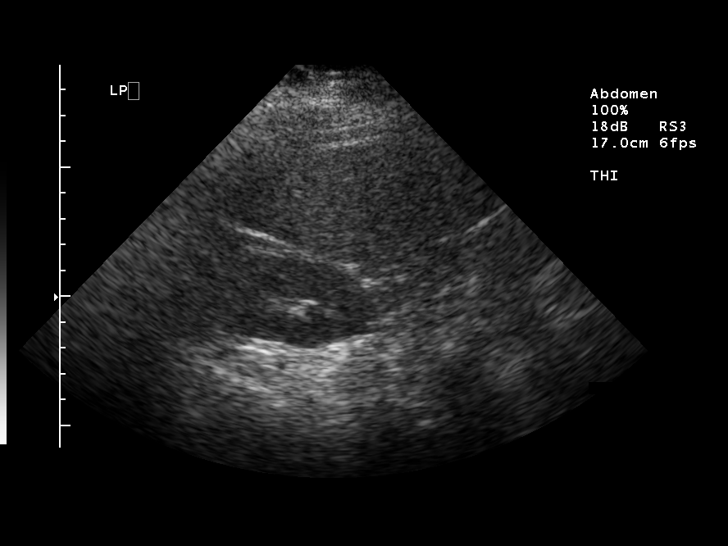
[im 42/78]
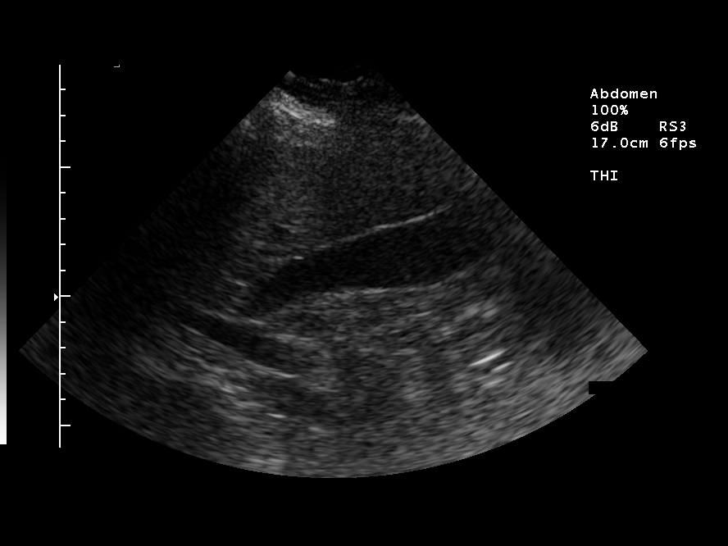
[im 49/78]
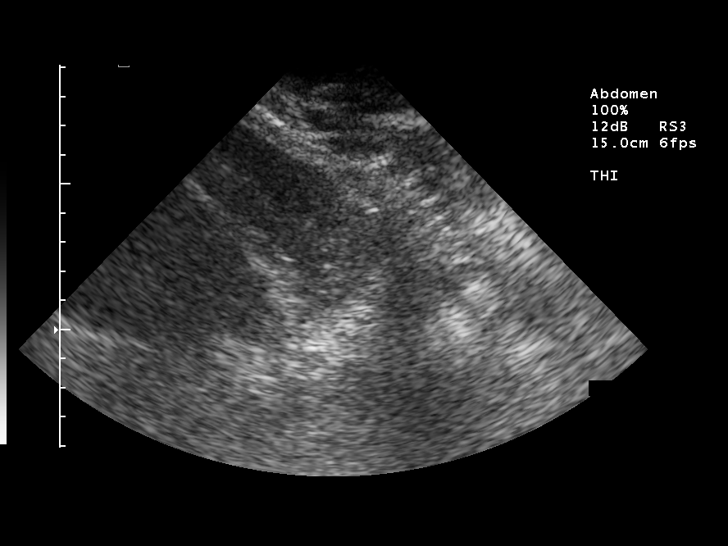
[im 52/78]
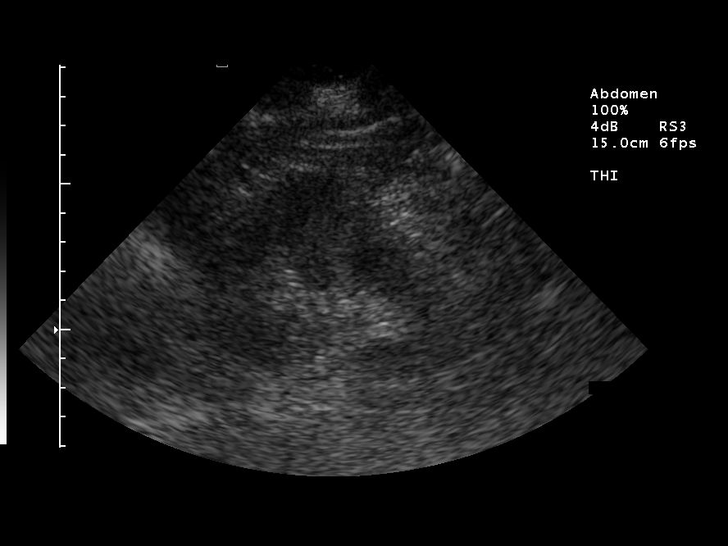
[im 58/78]
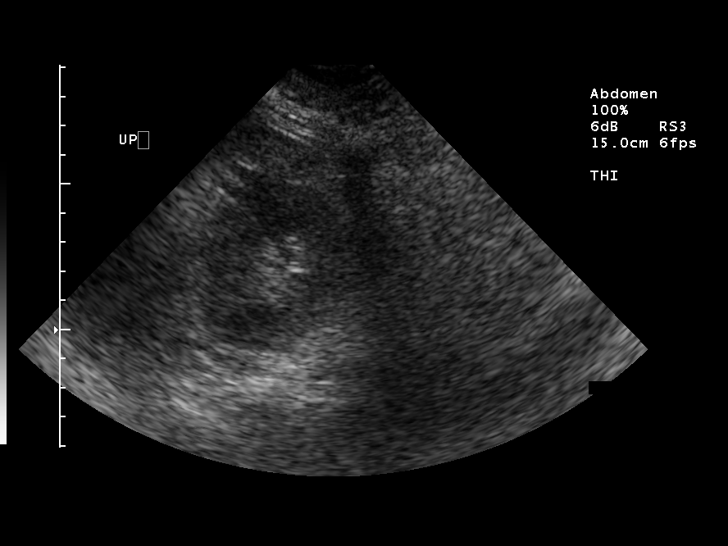
[im 65/78]
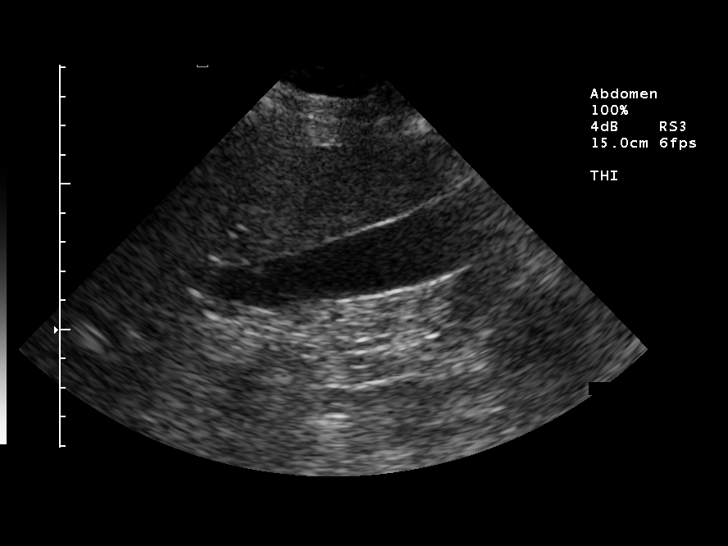
[im 71/78]
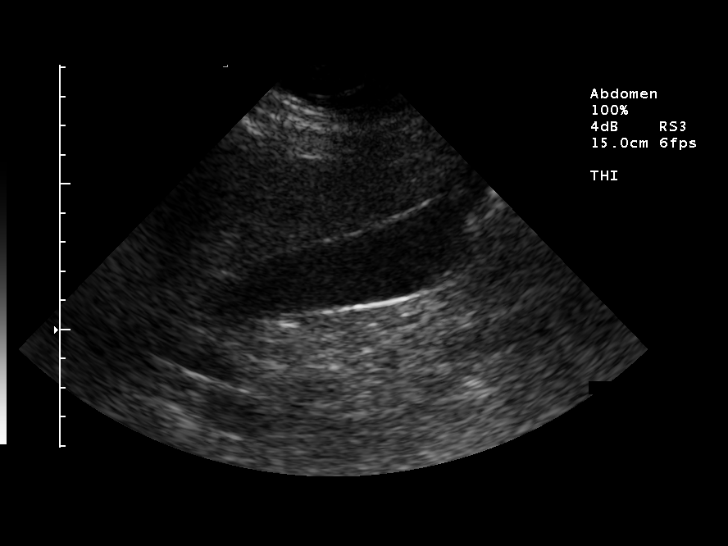
[im 78/78]
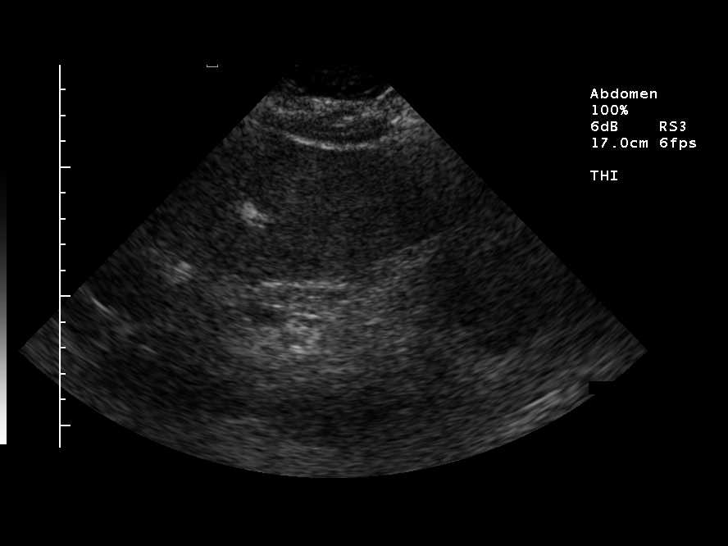

[14 of 25 positions shown; findings below may reference images not displayed]

FINDINGS: Gallbladder and biliary tree normal.  Common duct measured at 5.4 mm.  There are no focal lesions of the liver but it does appear to show relative increased echogenicity which is most commonly seen with fatty infiltration.  Diffuse hepatocellular disease cannot be excluded.  The spleen is normal. 
 Evaluation of the pancreas is limited due to the patient?s body habitus and to overlying bowel gas.  Only small portions of the body are visualized.  Kidney size normal without stones or obstruction.  Aorta and IVC normal.  Some of the distal aorta is obscured by gas.
IMPRESSION: 1.  Gallbladder and biliary tree normal. 
 2.  Diffuse increased echogenicity of the liver ? see report. 
 3.  Pancreas inadequately evaluated.

## 2006-01-10 IMAGING — CR DG HAND COMPLETE 3+V*R*
3 series · 3 of 3 positions shown · non-contrast
Comparison: None.

CLINICAL DATA: Laceration to fourth digit.
 RIGHT HAND ? 3 VIEW:

[view not recorded (1 of 3)]
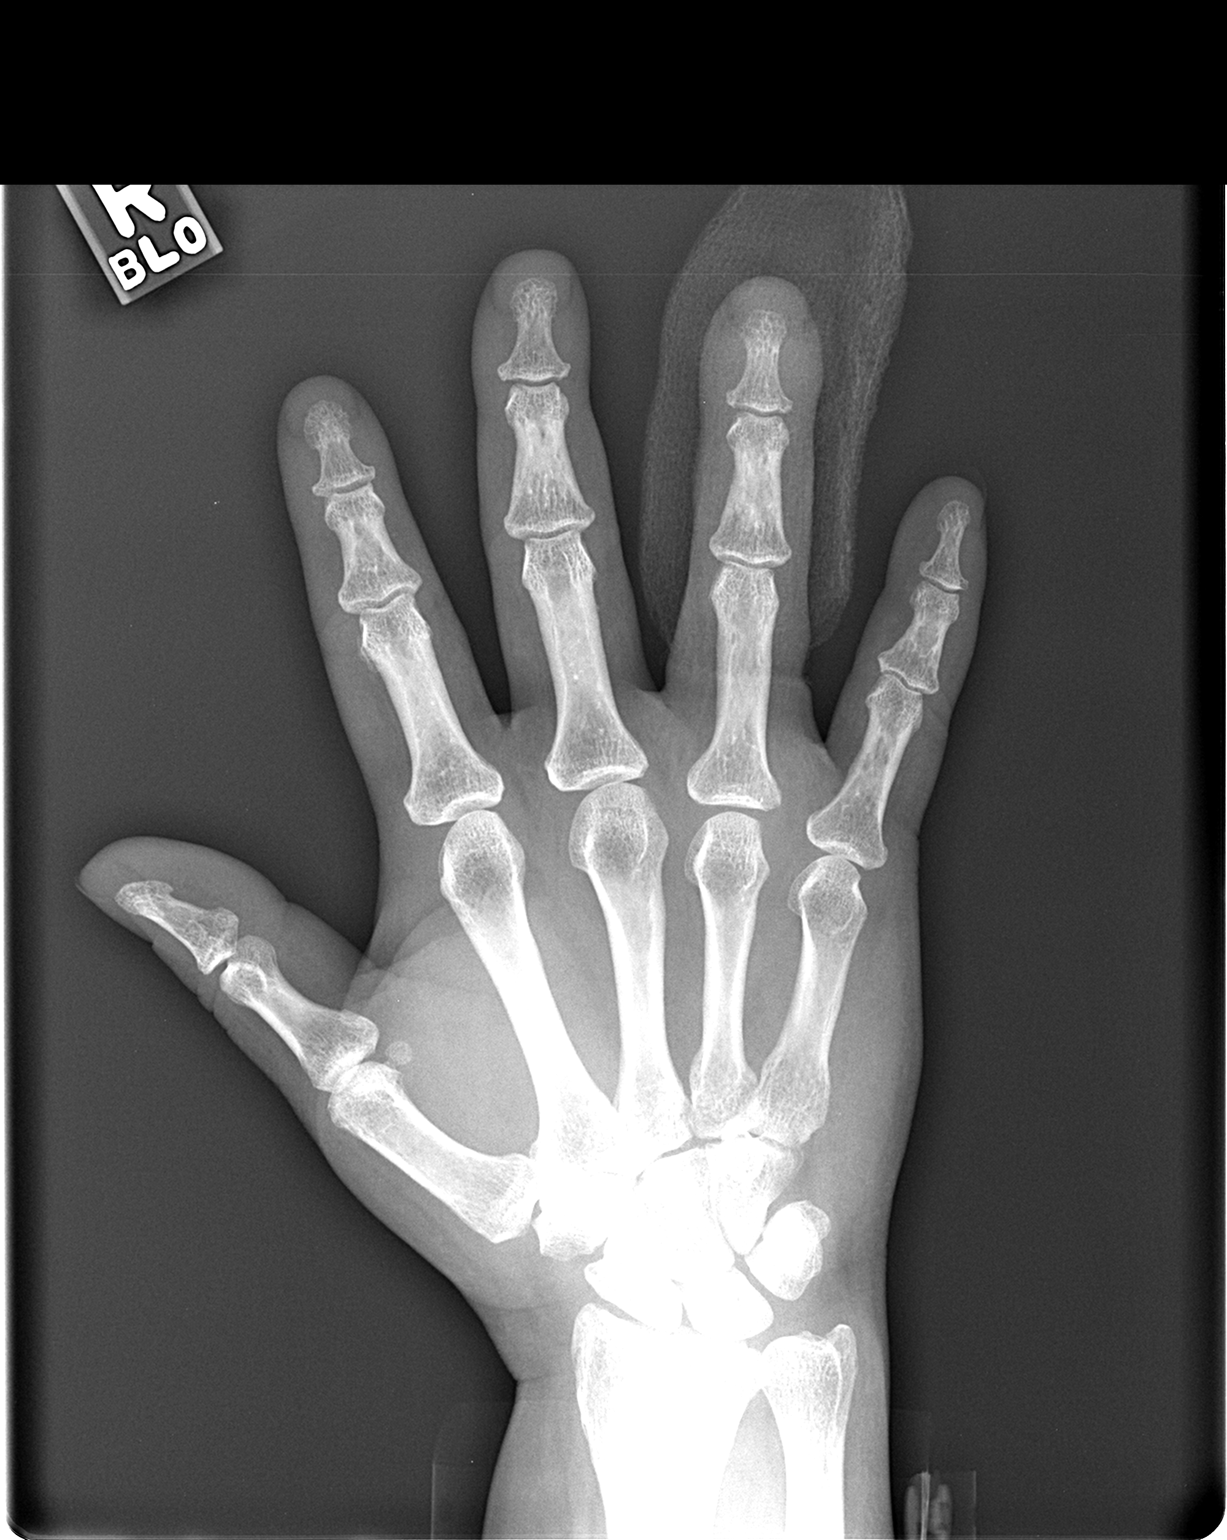

[view not recorded (2 of 3)]
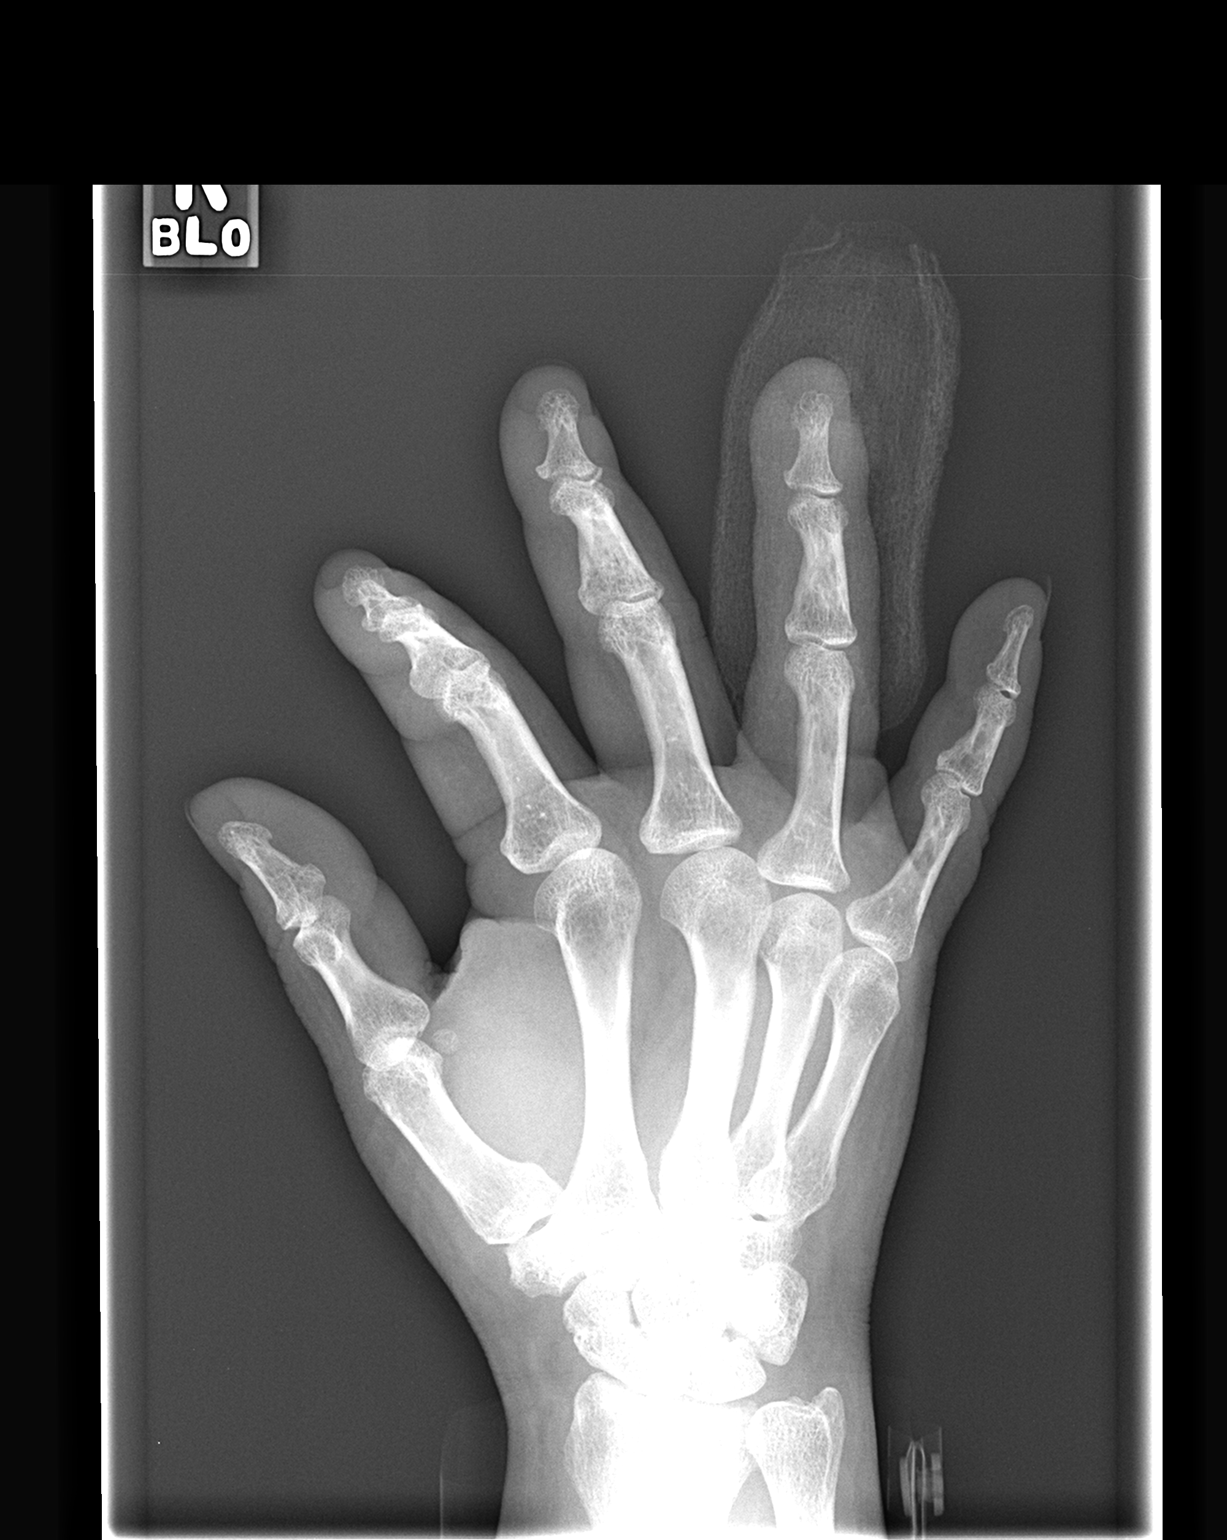

[view not recorded (3 of 3)]
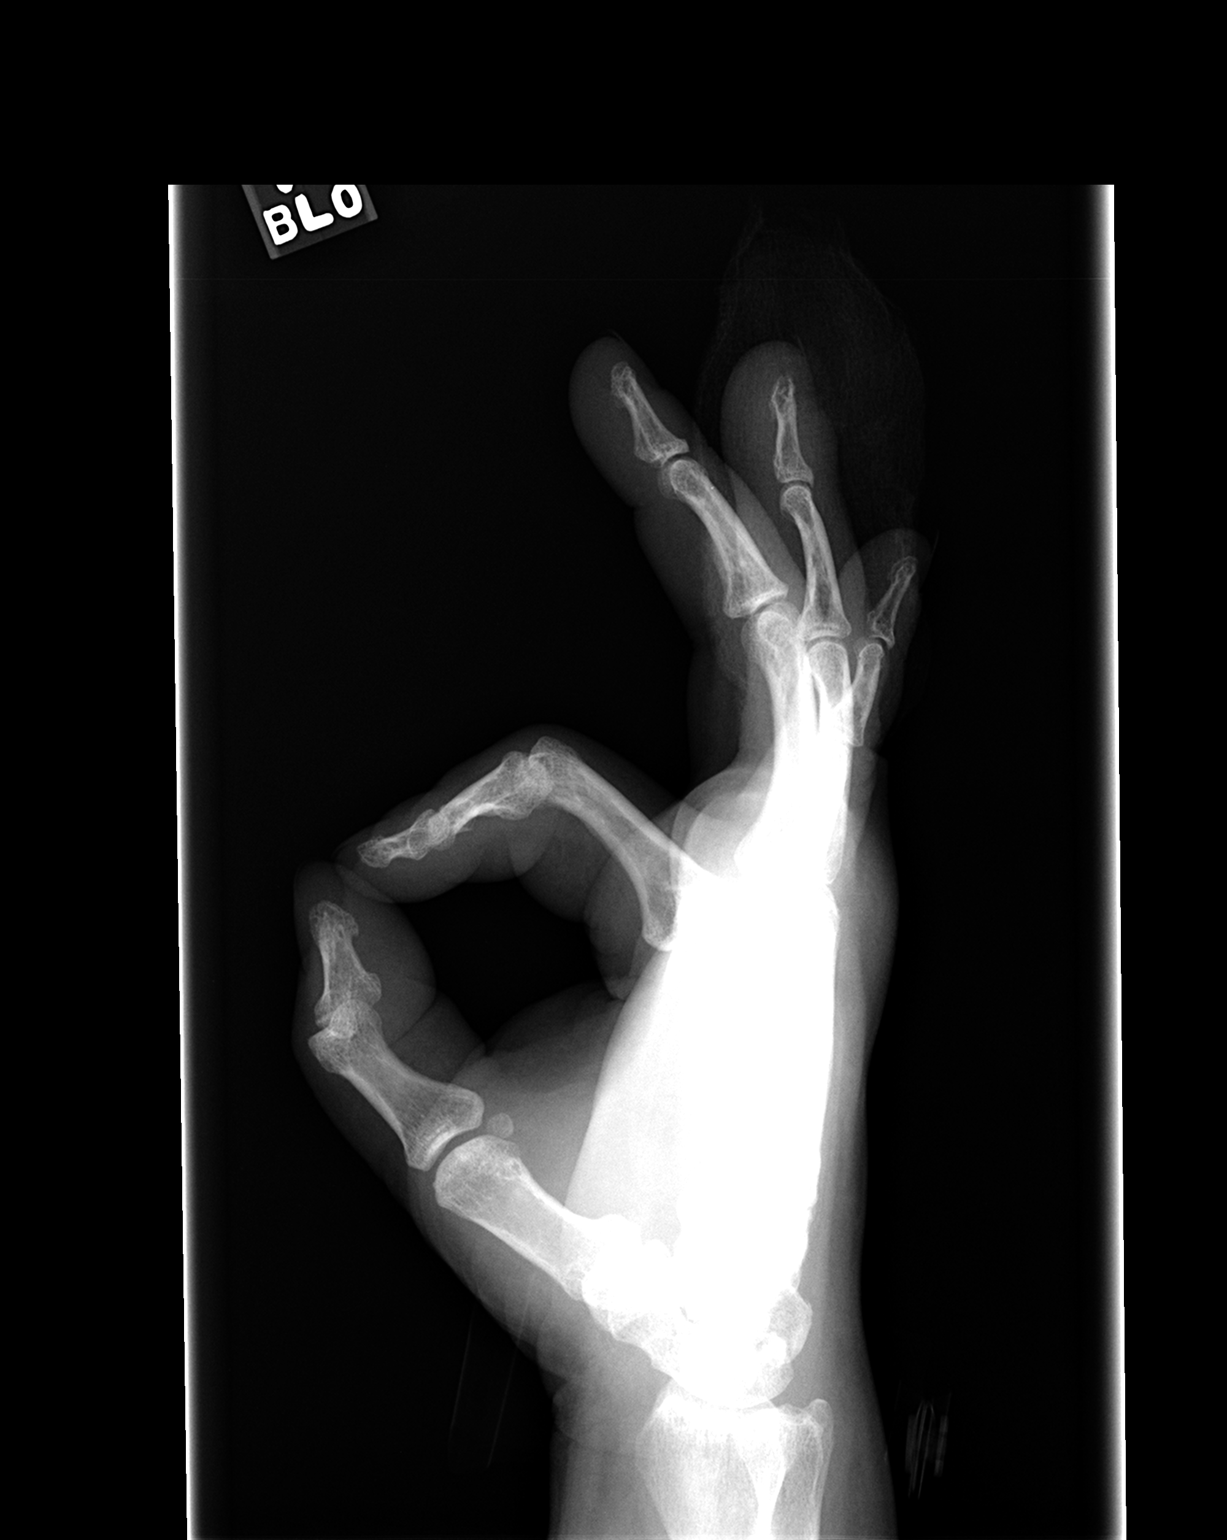

[3 of 3 positions shown; findings below may reference images not displayed]

FINDINGS: Overlying bandage material obscures fine bone detail.  No erosions are identified.  There is mild soft tissue swelling about the fourth digit.
IMPRESSION: 1.  No evidence for osteomyelitis.  Mild soft tissue swelling.
 2.  If there is continued concern for osteomyelitis, then MRI or bone scan would be recommended.

## 2006-01-19 ENCOUNTER — Ambulatory Visit: Payer: Self-pay | Admitting: Nurse Practitioner

## 2006-01-20 ENCOUNTER — Ambulatory Visit: Payer: Self-pay | Admitting: *Deleted

## 2006-01-26 ENCOUNTER — Ambulatory Visit: Payer: Self-pay | Admitting: Nurse Practitioner

## 2006-01-26 LAB — CONVERTED CEMR LAB: Pap Smear: NORMAL

## 2006-01-27 ENCOUNTER — Encounter (INDEPENDENT_AMBULATORY_CARE_PROVIDER_SITE_OTHER): Payer: Self-pay | Admitting: Nurse Practitioner

## 2006-01-27 LAB — CONVERTED CEMR LAB: TSH: 0.771 microintl units/mL

## 2006-02-21 ENCOUNTER — Ambulatory Visit (HOSPITAL_COMMUNITY): Admission: RE | Admit: 2006-02-21 | Discharge: 2006-02-21 | Payer: Self-pay | Admitting: Family Medicine

## 2006-02-21 IMAGING — US US PELVIS COMPLETE MODIFY
1 series · 18 of 22 positions shown · non-contrast
Comparison: none

CLINICAL DATA: No prior menses for three years ? bleeding beginning [DATE] for three weeks.
TRANSABDOMINAL AND TRANSVAGINAL PELVIC ULTRASOUND ? [DATE]:
TECHNIQUE: Both transabdominal and transvaginal ultrasound examinations of the pelvis were performed including evaluation of the uterus, ovaries, adnexal regions, and pelvic cul-de-sac.
Multiple images of the pelvis were obtained using transabdominal and endovaginal approaches.  The uterus has a sagittal length of 8.8 cm, an AP width of 5.8 cm, and a transverse width of 6.0 cm.  There is a focal fibroid identified which is emanating off the right anterolateral portion of the uterus demonstrating a partial subserosal component which measures 3.3 x 2.8 x 3.9 cm.  The remainder of the myometrium is homogeneous.  The endometrial canal is homogeneously echogenic with an AP width of 0.95 cm.  As the patient gives a history of no menses for three years prior to this most recent bleeding episode, she is presumably post menopausal and  this would be considered a thickened endometrium and further assessment with sonohysterography or endometrial sampling would be recommended.  
The ovaries could not be seen with confidence either transabdominally and endovaginally.

[Series 1: us transvaginal non-ob · 18 of 22 slices shown]
[im 1/22]
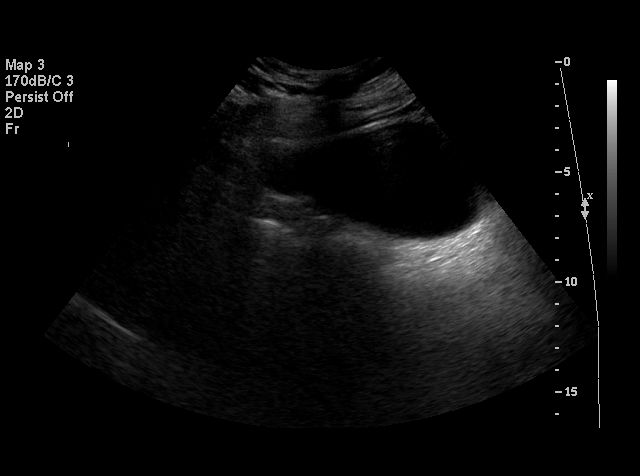
[im 2/22]
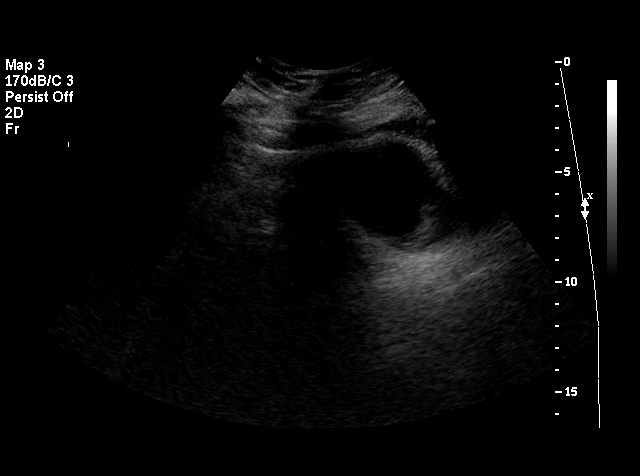
[im 4/22]
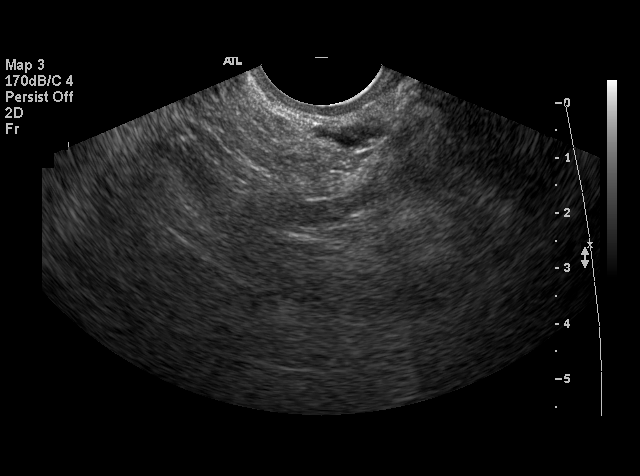
[im 5/22]
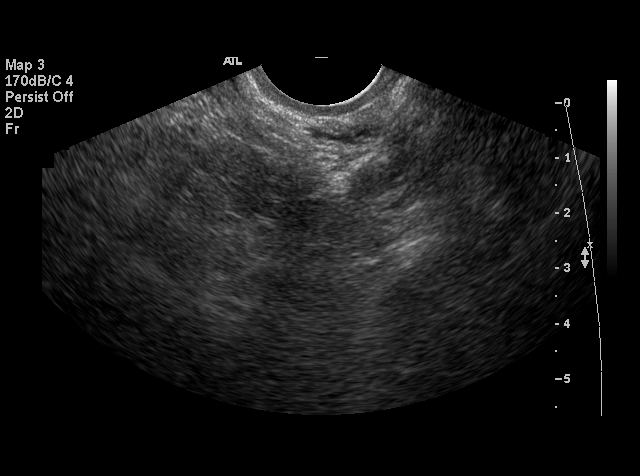
[im 6/22]
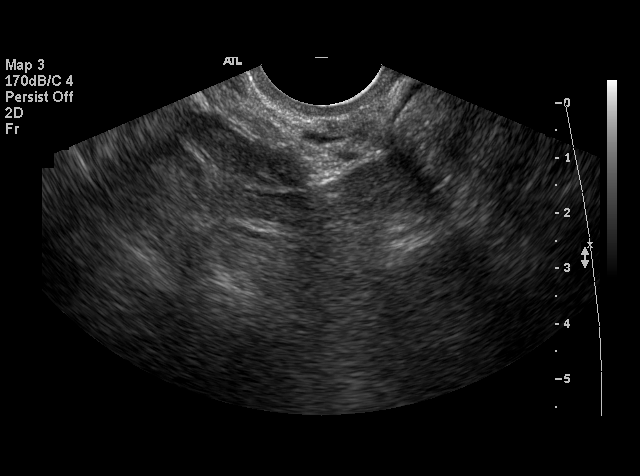
[im 7/22]
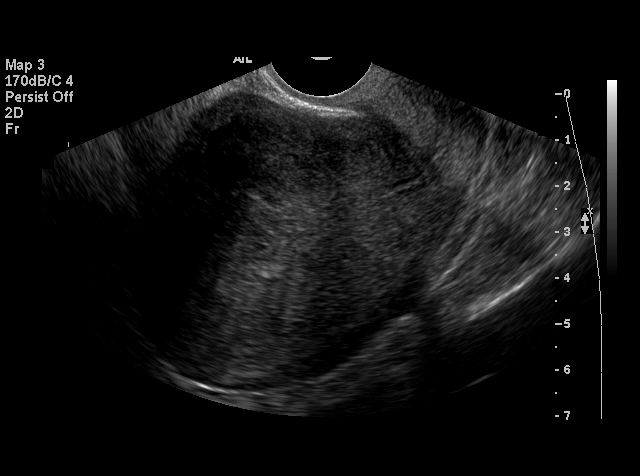
[im 8/22]
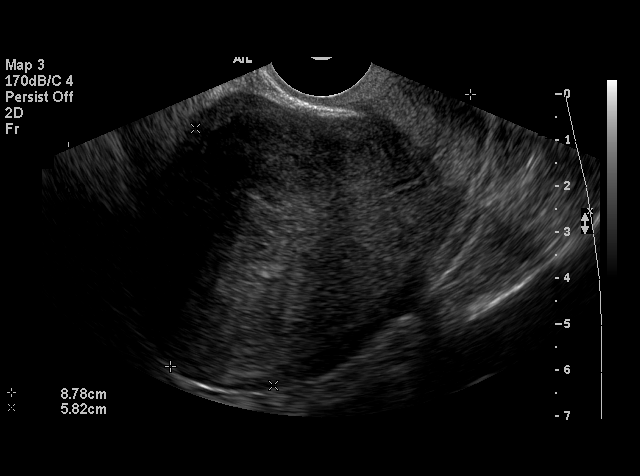
[im 10/22]
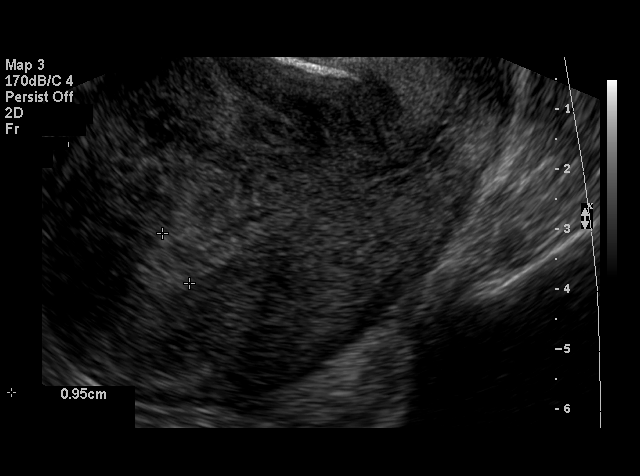
[im 11/22]
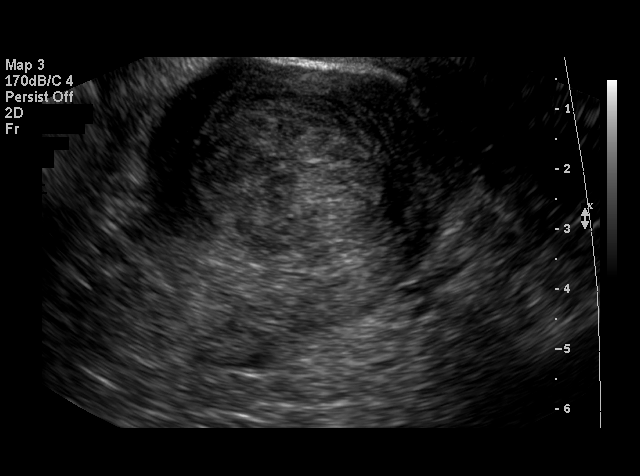
[im 12/22]
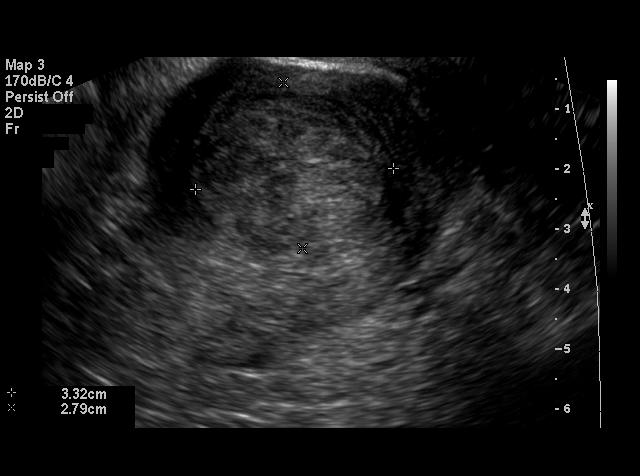
[im 13/22]
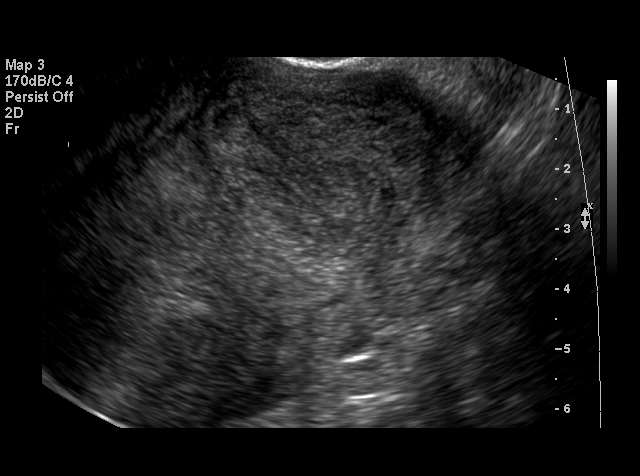
[im 15/22]
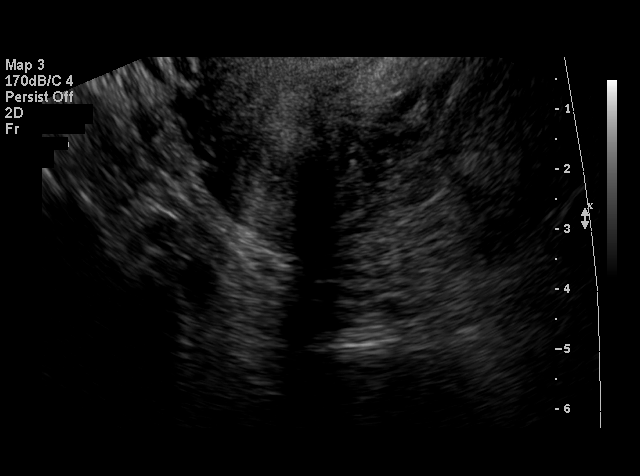
[im 16/22]
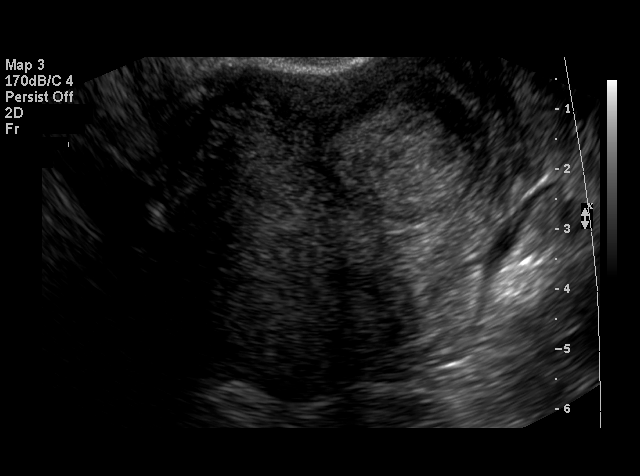
[im 17/22]
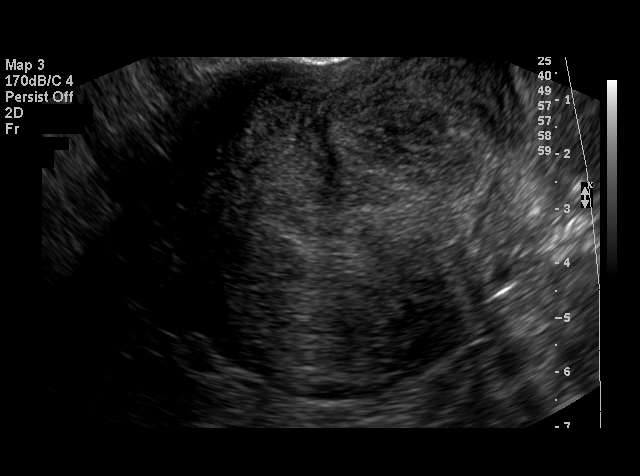
[im 18/22]
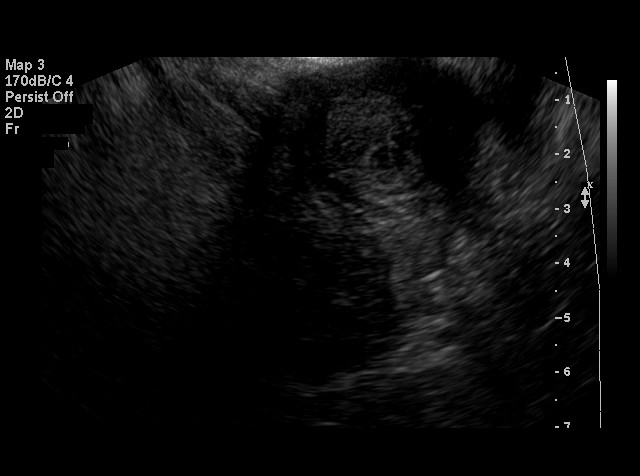
[im 19/22]
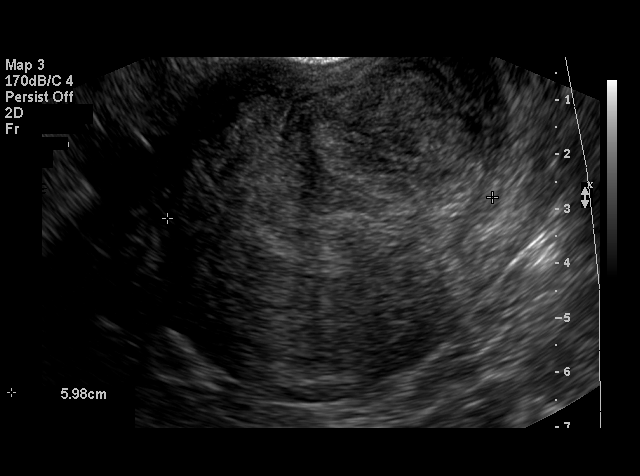
[im 21/22]
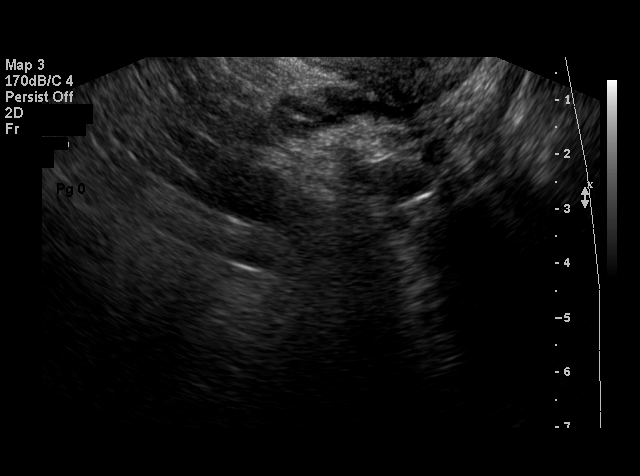
[im 22/22]
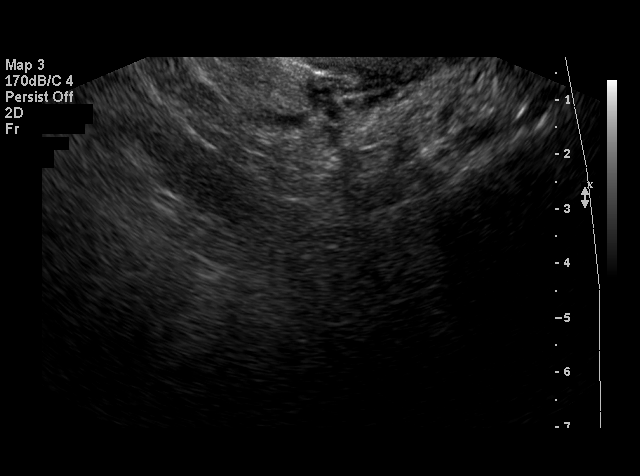

[18 of 22 positions shown; findings below may reference images not displayed]

IMPRESSION: 1.  Fibroid uterus with focal measurable fibroid size and location as noted above.  Given the patient?s history of no menses for three years prior to this bleeding episode,  postmenopausal status is assumed.  If this is the case the patient?s endometrium would be considered abnormally thick and further evaluation would be recommended.
2.  Nonvisualized ovaries.

## 2006-10-05 ENCOUNTER — Emergency Department (HOSPITAL_COMMUNITY): Admission: EM | Admit: 2006-10-05 | Discharge: 2006-10-05 | Payer: Self-pay | Admitting: Emergency Medicine

## 2006-10-05 IMAGING — CR DG CHEST 2V
2 series · 2 of 2 positions shown · non-contrast
Comparison: [DATE].

CLINICAL DATA: Cough, fever, shortness of breath.
 CHEST - 2 VIEW:

[w chest pa]
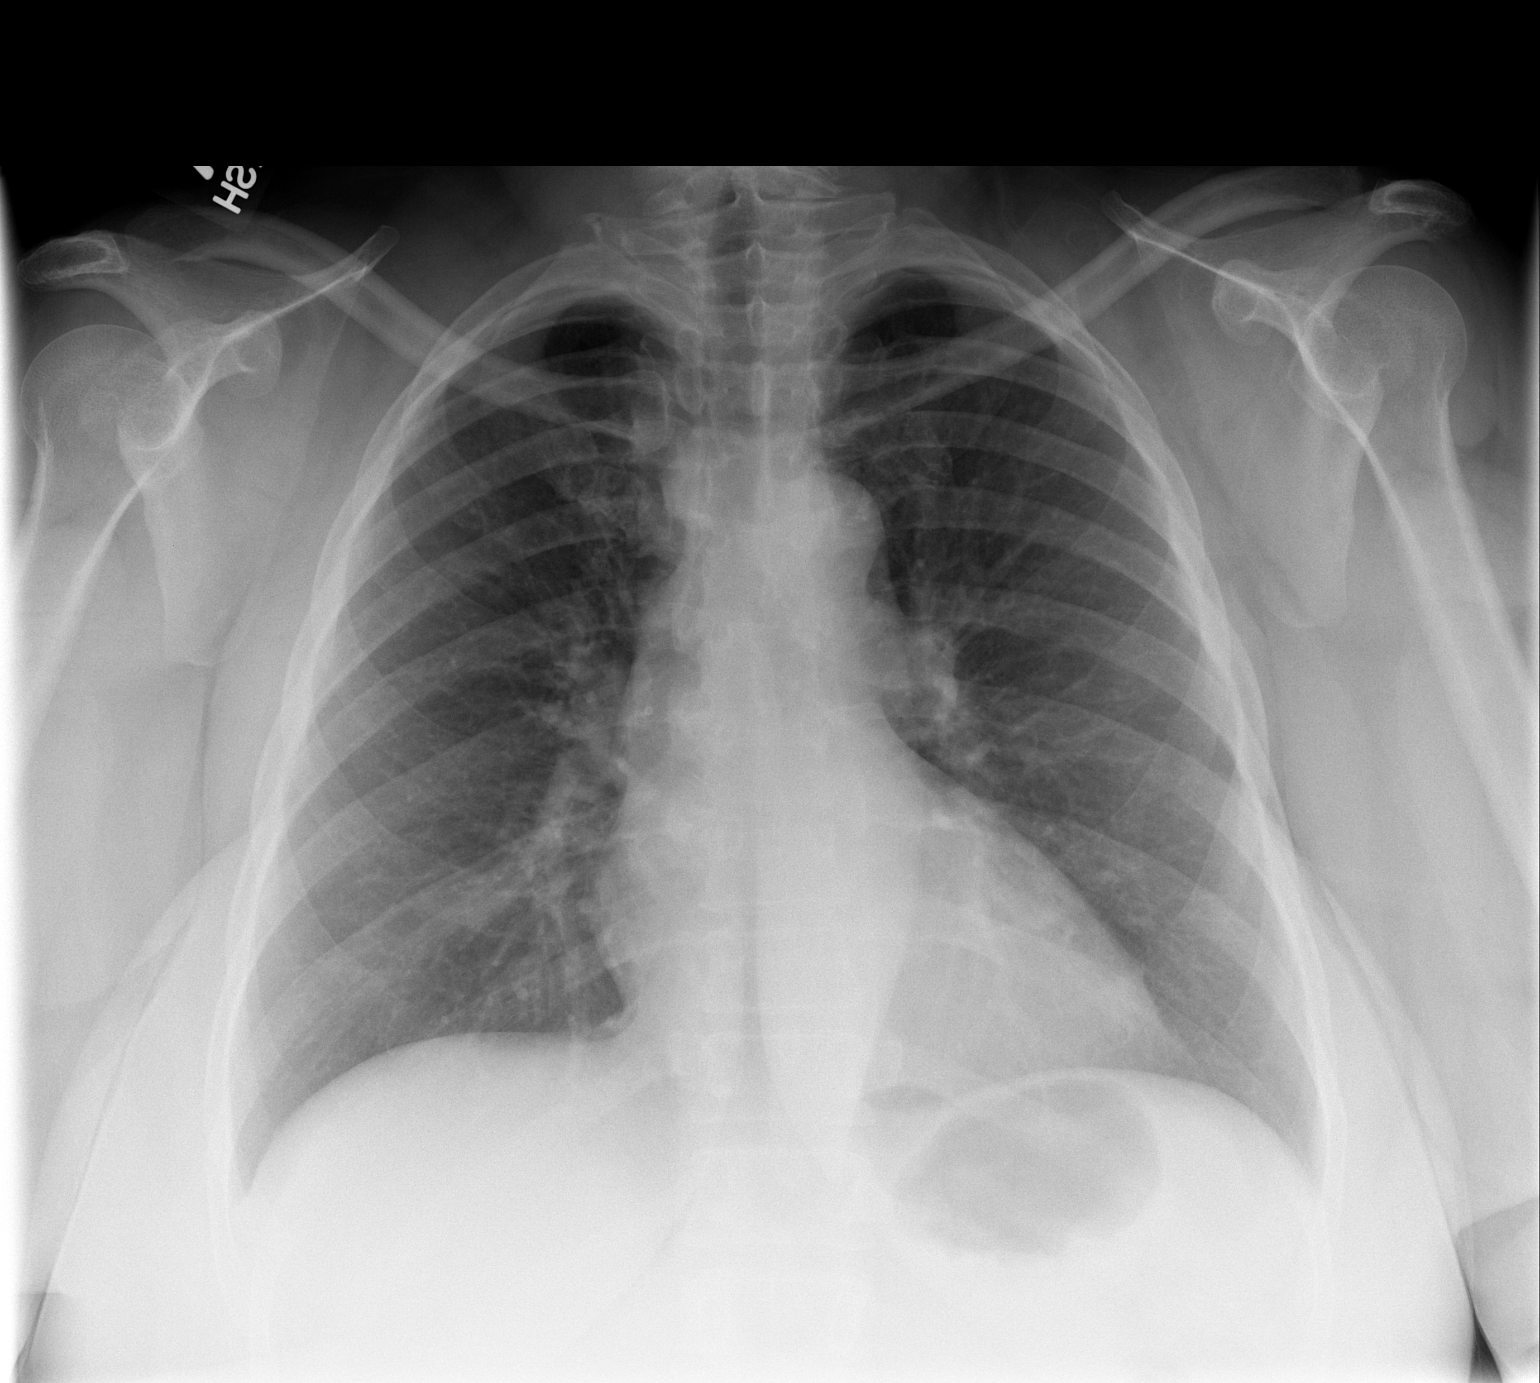

[w chest lat]
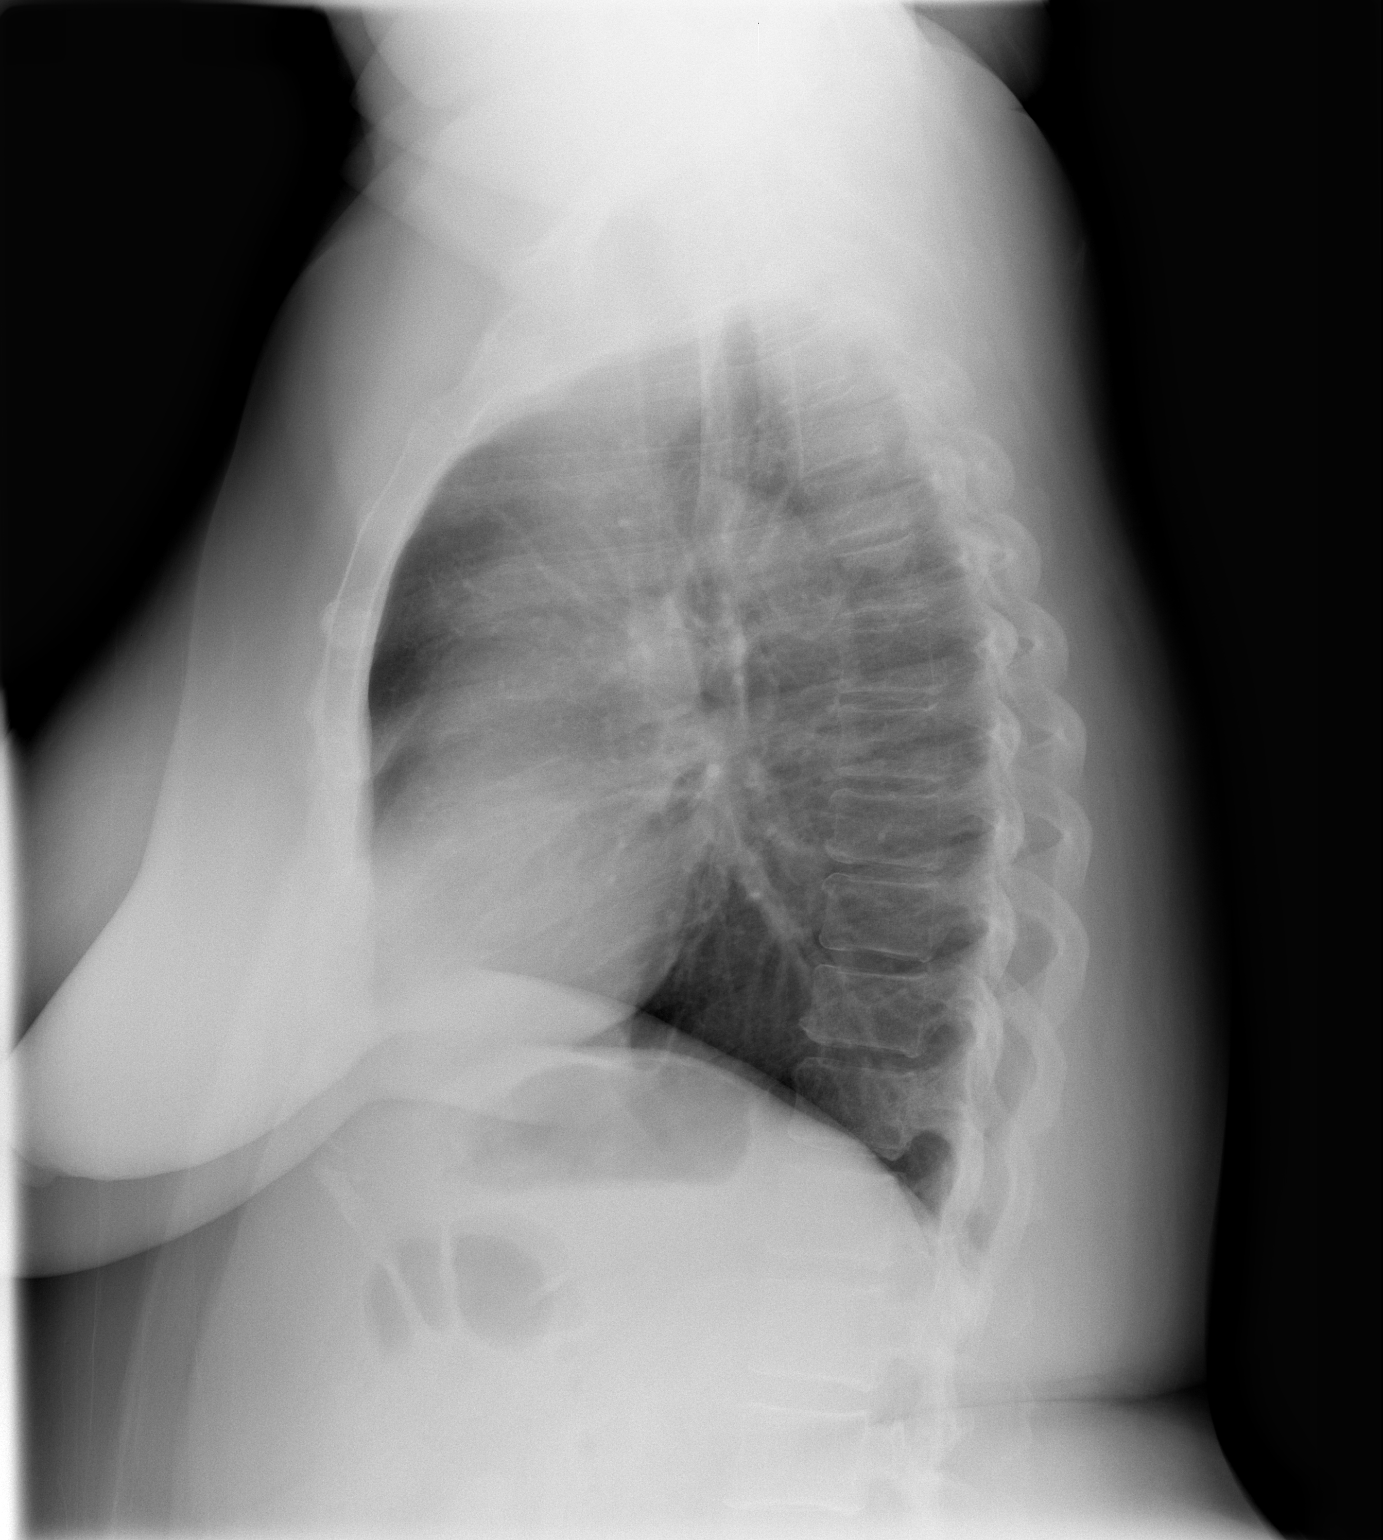

[2 of 2 positions shown; findings below may reference images not displayed]

FINDINGS: The heart size and mediastinal contours are within normal limits.  Both lungs are clear.  The visualized skeletal structures are unremarkable.
IMPRESSION: No active cardiopulmonary disease.

## 2006-10-17 ENCOUNTER — Ambulatory Visit: Payer: Self-pay | Admitting: Nurse Practitioner

## 2007-05-05 ENCOUNTER — Encounter (INDEPENDENT_AMBULATORY_CARE_PROVIDER_SITE_OTHER): Payer: Self-pay | Admitting: Nurse Practitioner

## 2007-05-05 DIAGNOSIS — F1010 Alcohol abuse, uncomplicated: Secondary | ICD-10-CM | POA: Insufficient documentation

## 2007-05-05 DIAGNOSIS — E669 Obesity, unspecified: Secondary | ICD-10-CM | POA: Insufficient documentation

## 2007-05-05 DIAGNOSIS — K5730 Diverticulosis of large intestine without perforation or abscess without bleeding: Secondary | ICD-10-CM | POA: Insufficient documentation

## 2007-05-17 ENCOUNTER — Encounter (INDEPENDENT_AMBULATORY_CARE_PROVIDER_SITE_OTHER): Payer: Self-pay | Admitting: *Deleted

## 2007-06-20 DIAGNOSIS — I10 Essential (primary) hypertension: Secondary | ICD-10-CM | POA: Insufficient documentation

## 2007-06-20 DIAGNOSIS — M545 Low back pain, unspecified: Secondary | ICD-10-CM | POA: Insufficient documentation

## 2007-11-08 ENCOUNTER — Ambulatory Visit: Payer: Self-pay | Admitting: Cardiology

## 2007-11-08 ENCOUNTER — Inpatient Hospital Stay (HOSPITAL_COMMUNITY): Admission: EM | Admit: 2007-11-08 | Discharge: 2007-11-13 | Payer: Self-pay | Admitting: Emergency Medicine

## 2007-11-08 IMAGING — CT CT HEAD W/O CM
1 series · 16 of 30 positions shown, 20 images · non-contrast
Comparison: none

CLINICAL DATA: Syncope.  Headache. 
 HEAD CT WITHOUT CONTRAST ? [DATE]:
TECHNIQUE: Contiguous axial images were obtained from the base of the skull through the vertex according to standard protocol without contrast.
 No comparison.

[Series 2: head_seq 4.5 h37s st · axial · 0.43mm/px · z∈[+1005,+1149]mm · 16 of 36 slices shown, 20 images]
[im 2/36  brain]
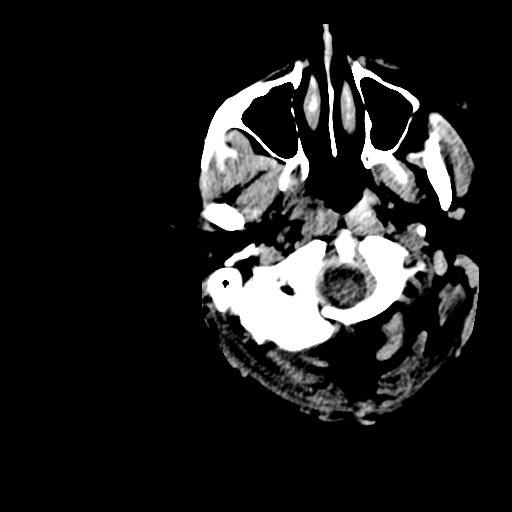
[im 2/36  bone]
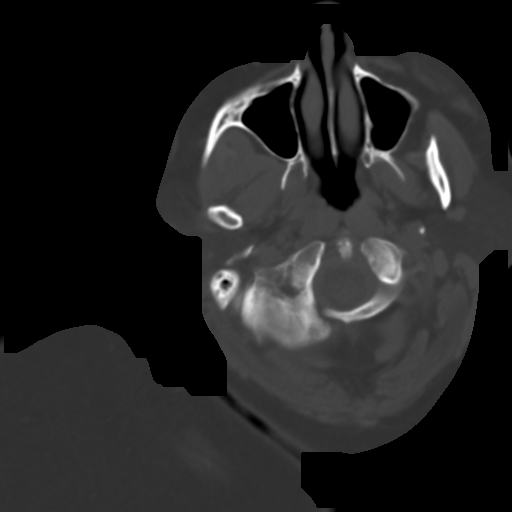
[im 4/36  brain]
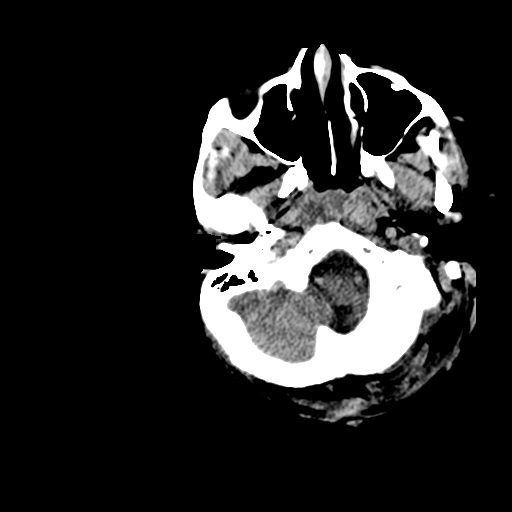
[im 7/36  brain]
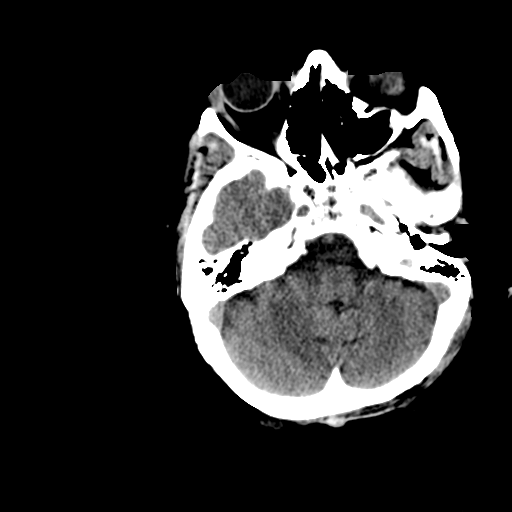
[im 9/36  brain]
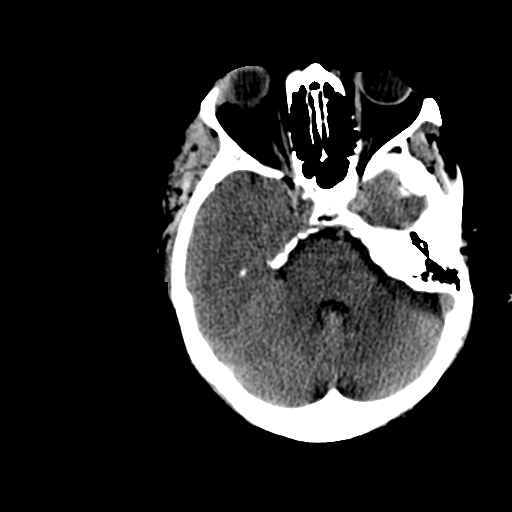
[im 10/36  brain]
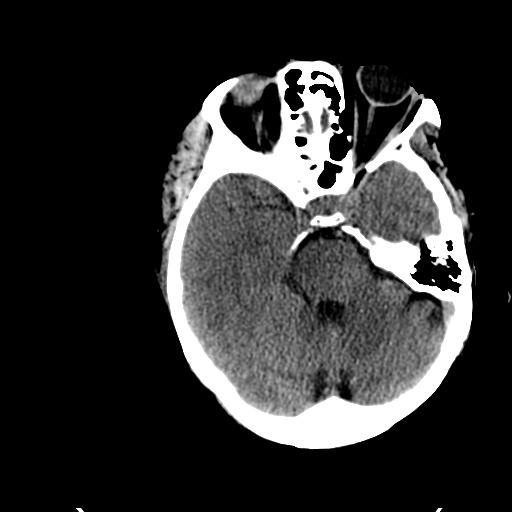
[im 10/36  bone]
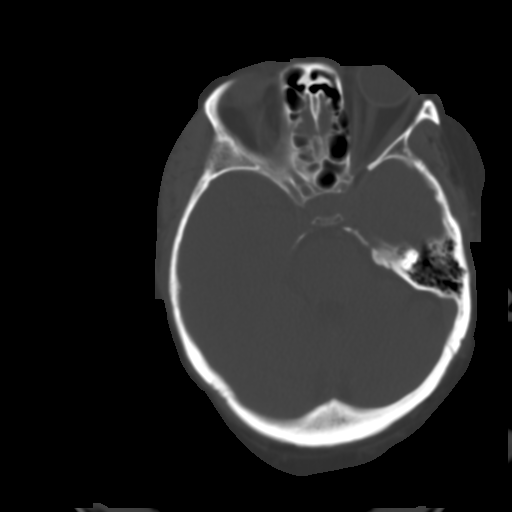
[im 13/36  brain]
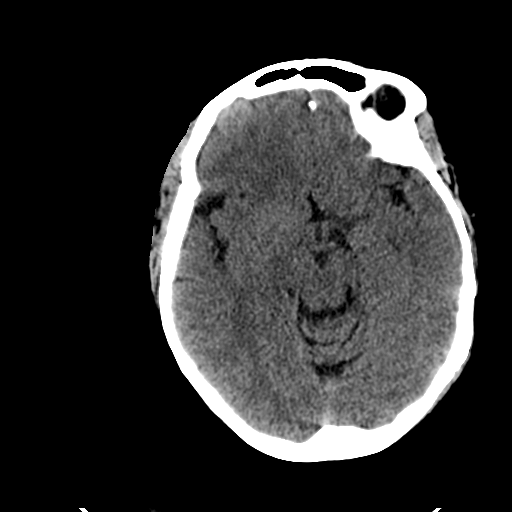
[im 15/36  brain]
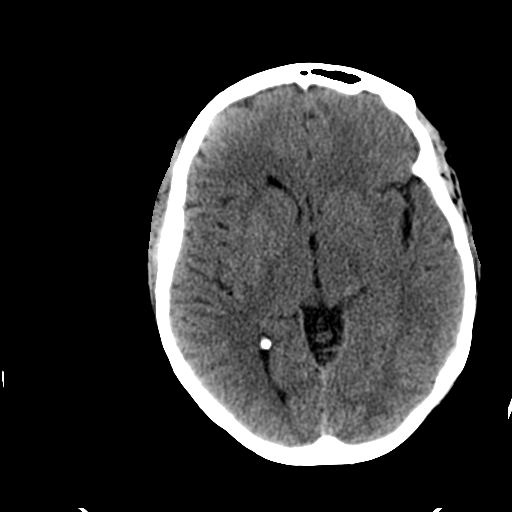
[im 17/36  brain]
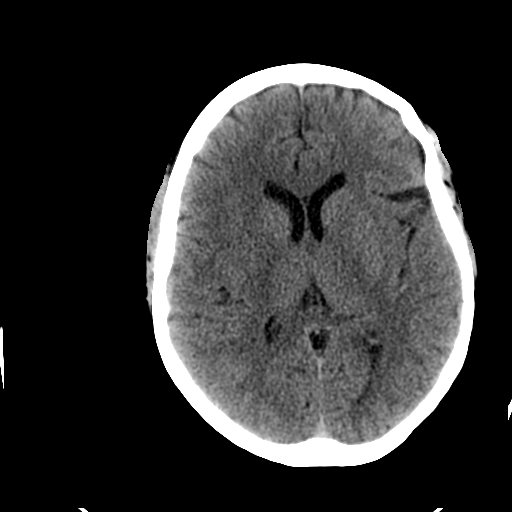
[im 19/36  brain]
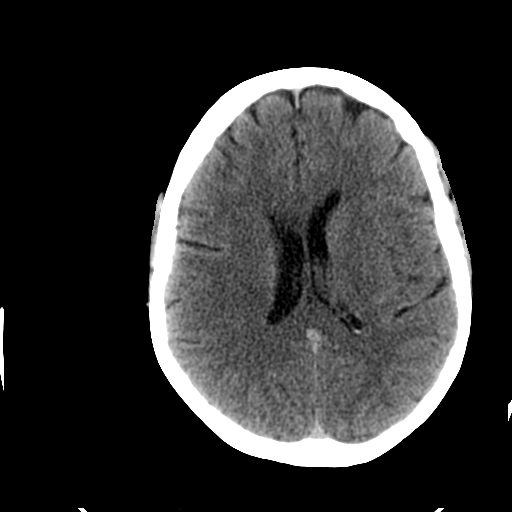
[im 19/36  bone]
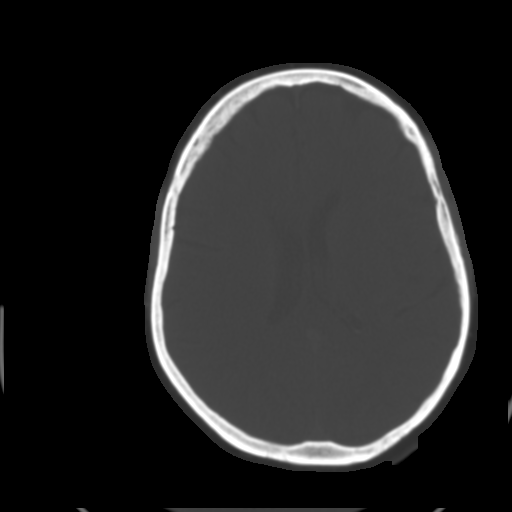
[im 21/36  brain]
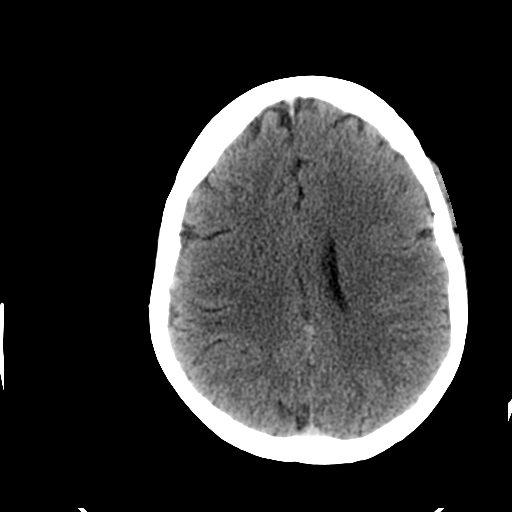
[im 23/36  brain]
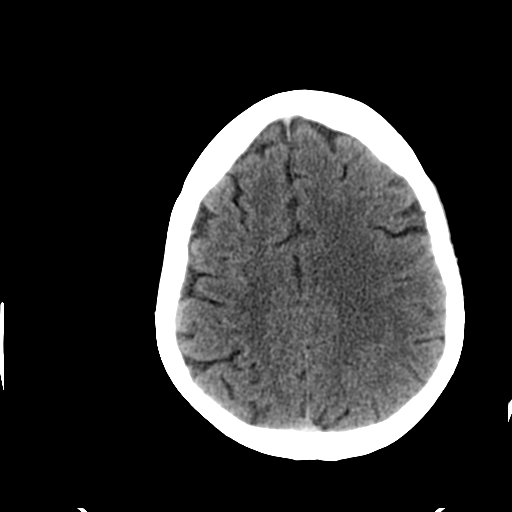
[im 26/36  brain]
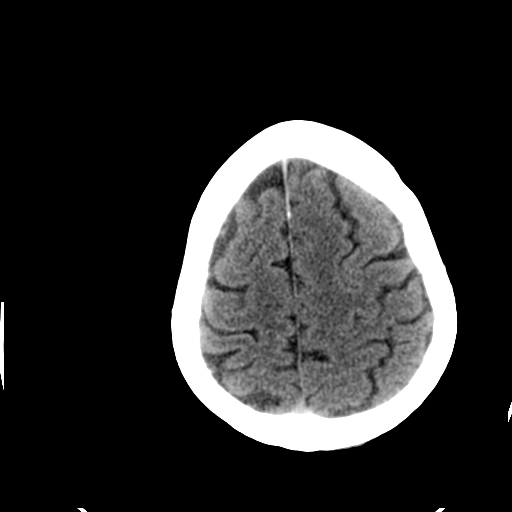
[im 27/36  brain]
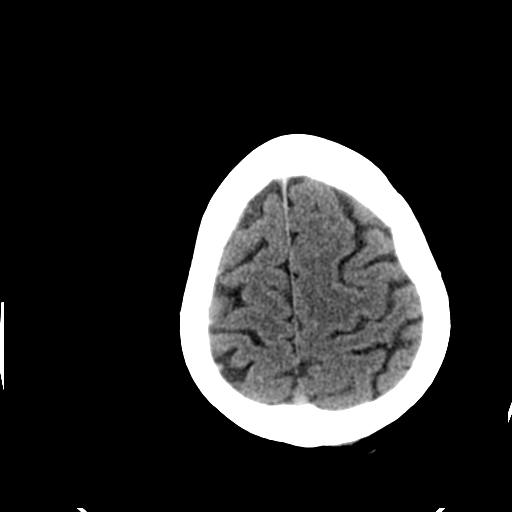
[im 27/36  bone]
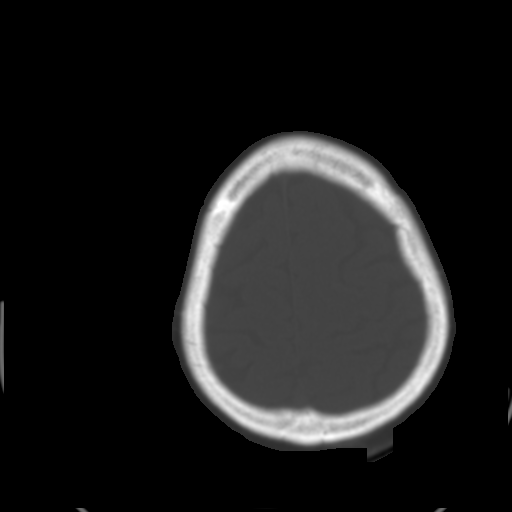
[im 29/36  brain]
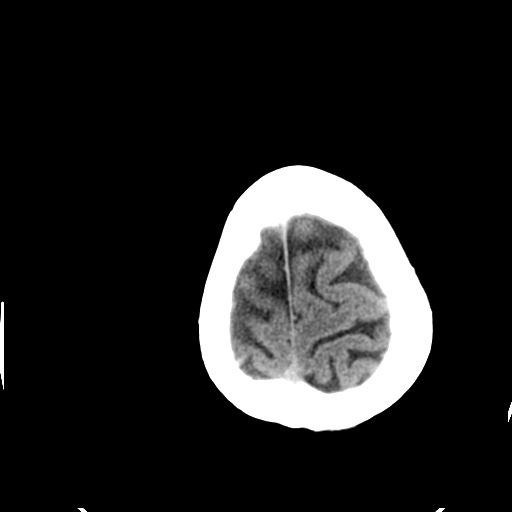
[im 32/36  brain]
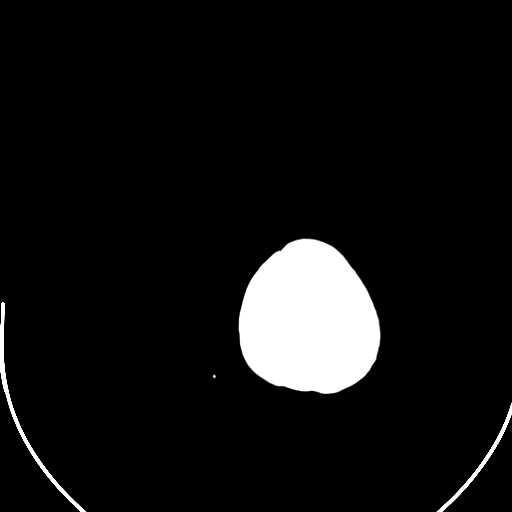
[im 34/36  brain]
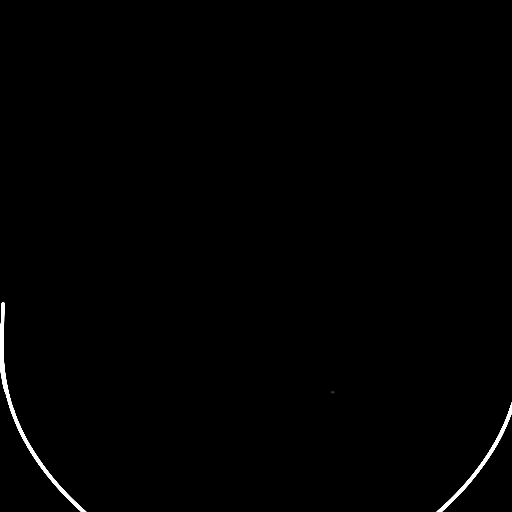

[16 of 30 positions shown; findings below may reference images not displayed]

FINDINGS: There is no evidence of intracranial hemorrhage, brain edema, acute infarct, mass lesion, or mass effect.  No other intra-axial abnormalities are seen, and the ventricles are within normal limits.  No abnormal extra-axial fluid collections or masses are identified.  No skull abnormalities are noted.
IMPRESSION: Negative non-contrast head CT.

## 2007-11-09 ENCOUNTER — Encounter (INDEPENDENT_AMBULATORY_CARE_PROVIDER_SITE_OTHER): Payer: Self-pay | Admitting: Internal Medicine

## 2007-11-09 ENCOUNTER — Ambulatory Visit: Payer: Self-pay | Admitting: Surgery

## 2007-11-10 ENCOUNTER — Encounter (INDEPENDENT_AMBULATORY_CARE_PROVIDER_SITE_OTHER): Payer: Self-pay | Admitting: Internal Medicine

## 2007-11-11 IMAGING — US US ABDOMEN COMPLETE
1 series · 14 of 25 positions shown · non-contrast
Comparison: none

CLINICAL DATA: Abnormal LFTs

[Series 1: unknown · 0.40mm/px · 14 of 79 slices shown]
[im 1/79]
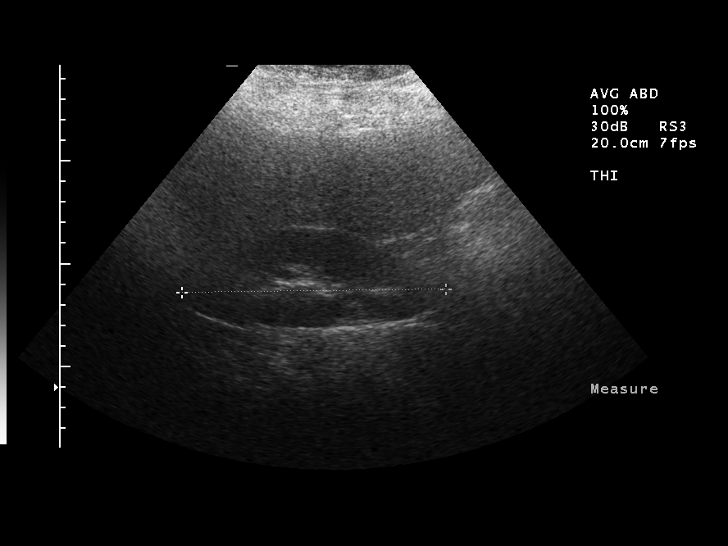
[im 7/79]
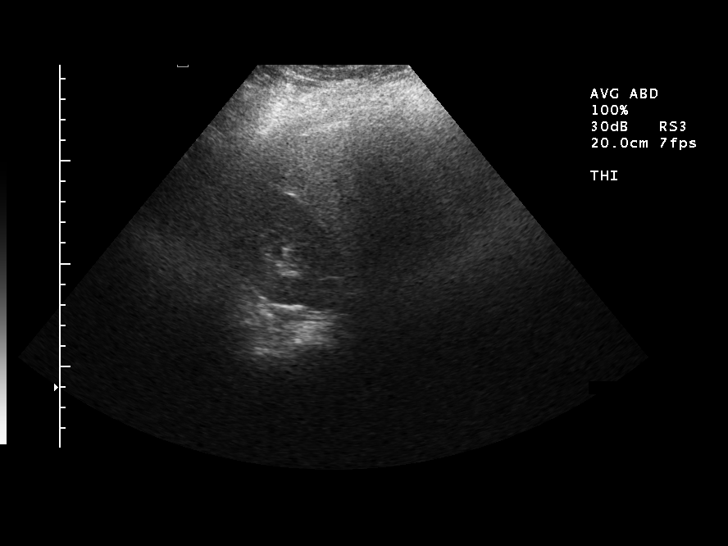
[im 14/79]
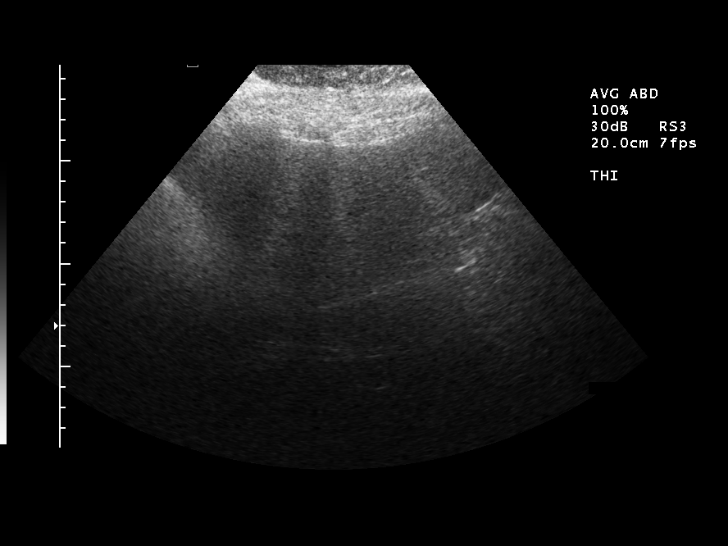
[im 20/79]
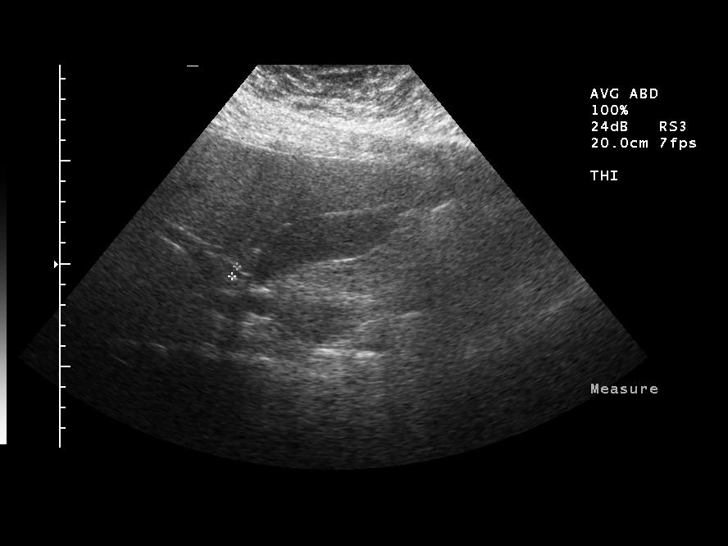
[im 27/79]
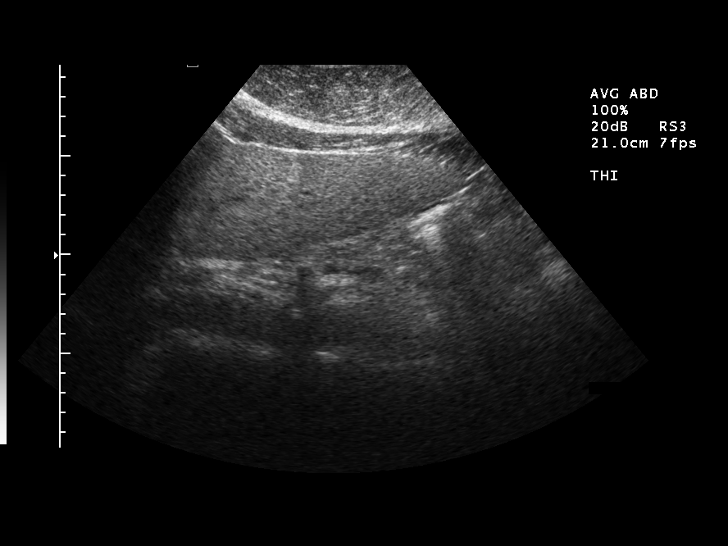
[im 30/79]
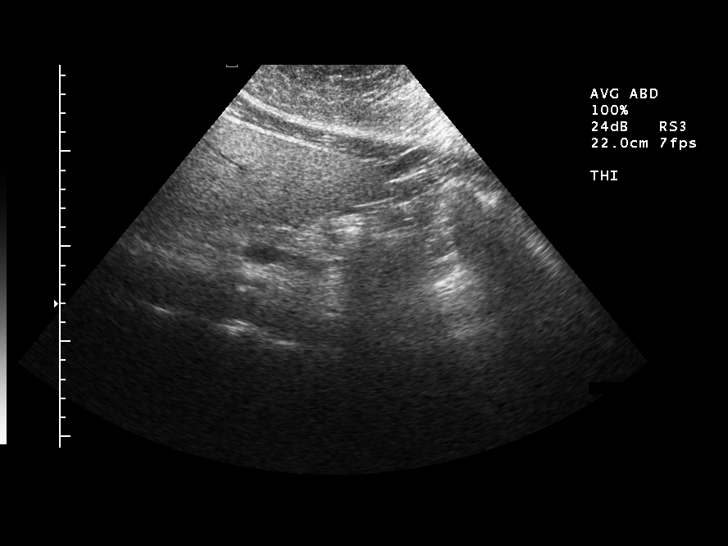
[im 36/79]
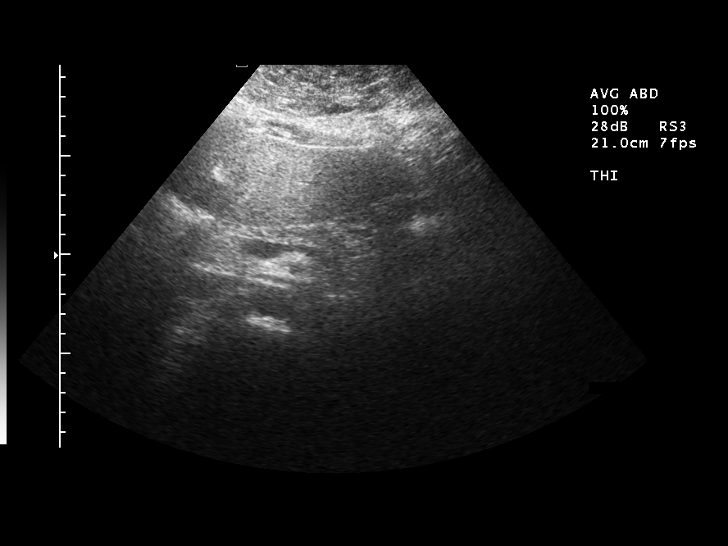
[im 43/79]
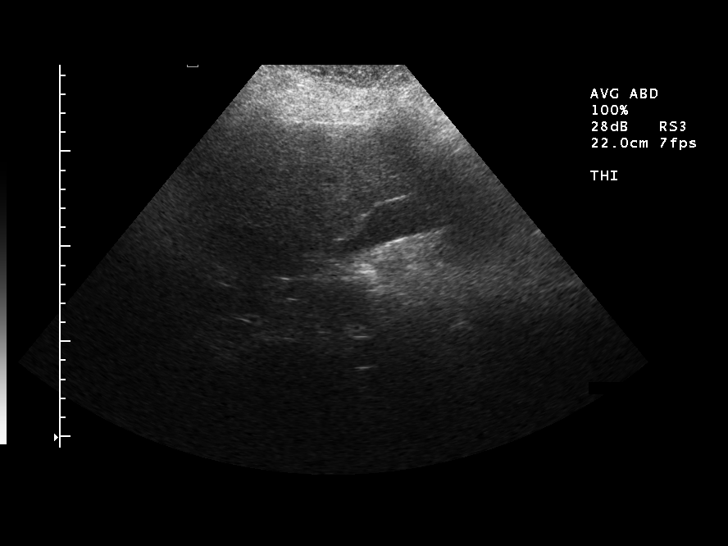
[im 49/79]
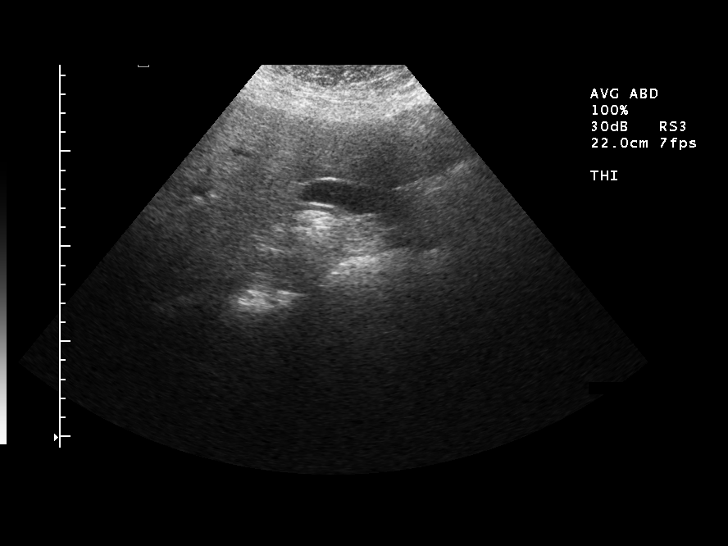
[im 53/79]
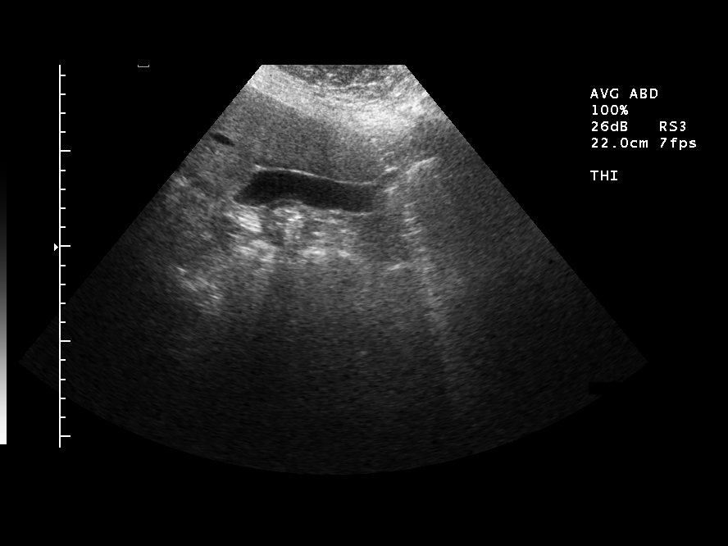
[im 59/79]
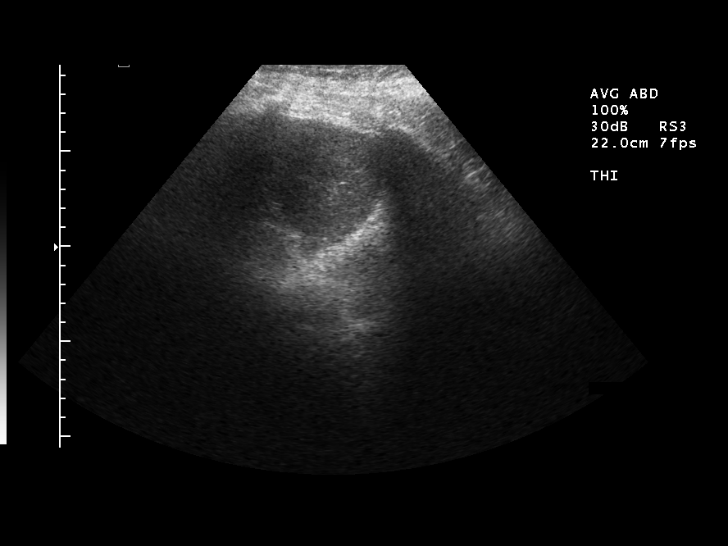
[im 66/79]
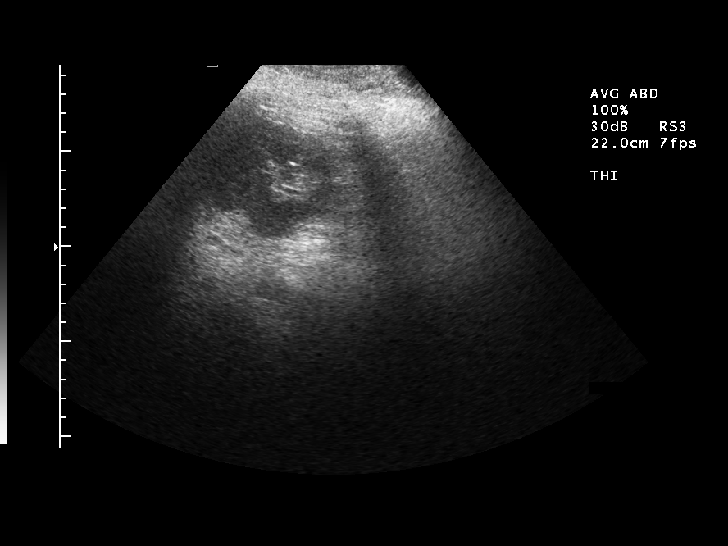
[im 72/79]
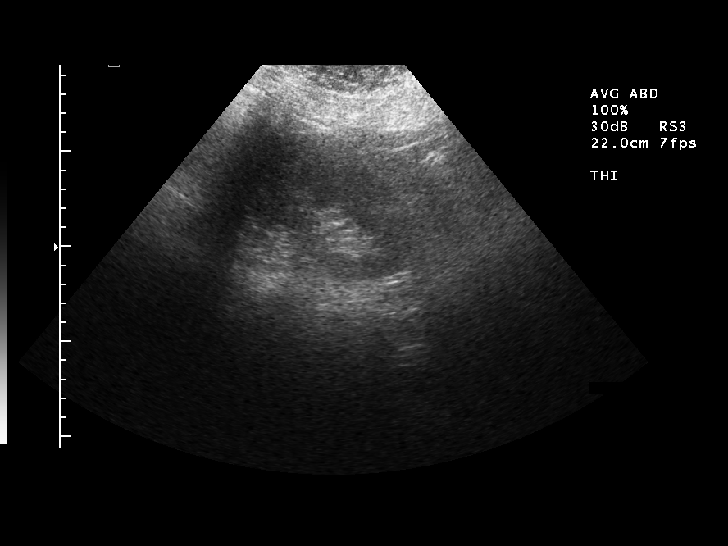
[im 79/79]
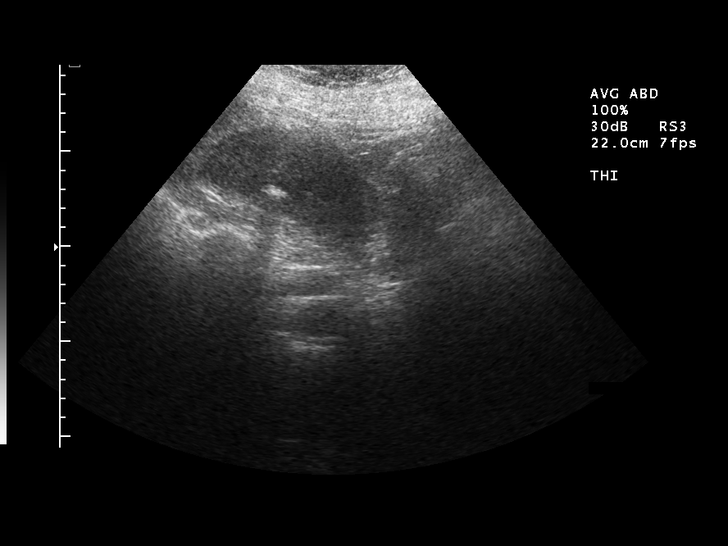

[14 of 25 positions shown; findings below may reference images not displayed]

Ultrasound abdomen:

Comparison [DATE].  Complete abdominal ultrasound examination was performed
including evaluation of the liver, gallbladder, bile ducts, pancreas, kidneys,
spleen, IVC, and abdominal aorta.

There is no evidence of gallstones or biliary ductal dilatation.  The liver is
within normal limits in echogenicity, and no focal liver lesions are seen.  The
visualized portions of the IVC and pancreas are unremarkable.

There is no evidence of splenomegaly.  The kidneys are unremarkable, and there
is no evidence of hydronephrosis.  There is an echogenic focus in the lower pole
of the left kidney, which was evident on previous CT of [DATE]. The
abdominal aorta is non-dilated.
IMPRESSION: 1.  Stable left renal parenchymal calcification.
2. Otherwise negative abdominal ultrasound.

## 2007-11-17 ENCOUNTER — Ambulatory Visit: Payer: Self-pay | Admitting: Family Medicine

## 2008-05-17 ENCOUNTER — Ambulatory Visit: Payer: Self-pay | Admitting: Internal Medicine

## 2008-05-17 LAB — CONVERTED CEMR LAB
ALT: 17 units/L (ref 0–35)
AST: 27 units/L (ref 0–37)
Albumin: 4 g/dL (ref 3.5–5.2)
Alkaline Phosphatase: 46 units/L (ref 39–117)
Amphetamine Screen, Ur: NEGATIVE
BUN: 12 mg/dL (ref 6–23)
Barbiturate Quant, Ur: NEGATIVE
Basophils Absolute: 0 10*3/uL (ref 0.0–0.1)
Basophils Relative: 1 % (ref 0–1)
Benzodiazepines.: NEGATIVE
CO2: 24 meq/L (ref 19–32)
Calcium: 8.5 mg/dL (ref 8.4–10.5)
Chloride: 102 meq/L (ref 96–112)
Cholesterol: 232 mg/dL — ABNORMAL HIGH (ref 0–200)
Cocaine Metabolites: NEGATIVE
Creatinine, Ser: 0.63 mg/dL (ref 0.40–1.20)
Creatinine,U: 128.7 mg/dL
Eosinophils Absolute: 0.2 10*3/uL (ref 0.0–0.7)
Eosinophils Relative: 3 % (ref 0–5)
Glucose, Bld: 100 mg/dL — ABNORMAL HIGH (ref 70–99)
HCT: 35.3 % — ABNORMAL LOW (ref 36.0–46.0)
HDL: 88 mg/dL (ref >39)
Hemoglobin: 12 g/dL (ref 12.0–15.0)
LDL Cholesterol: 133 mg/dL — ABNORMAL HIGH (ref 0–99)
Lymphocytes Relative: 44 % (ref 12–46)
Lymphs Abs: 2.5 10*3/uL (ref 0.7–4.0)
MCHC: 34 g/dL (ref 30.0–36.0)
MCV: 93.9 fL (ref 78.0–100.0)
Marijuana Metabolite: POSITIVE — AB
Methadone: NEGATIVE
Monocytes Absolute: 0.4 10*3/uL (ref 0.1–1.0)
Monocytes Relative: 8 % (ref 3–12)
Neutro Abs: 2.6 10*3/uL (ref 1.7–7.7)
Neutrophils Relative %: 45 % (ref 43–77)
Opiate Screen, Urine: NEGATIVE
Phencyclidine (PCP): NEGATIVE
Platelets: 289 10*3/uL (ref 150–400)
Potassium: 4.1 meq/L (ref 3.5–5.3)
Propoxyphene: NEGATIVE
RBC: 3.76 M/uL — ABNORMAL LOW (ref 3.87–5.11)
RDW: 14 % (ref 11.5–15.5)
Sodium: 138 meq/L (ref 135–145)
TSH: 1.624 microintl units/mL (ref 0.350–4.50)
Total Bilirubin: 0.6 mg/dL (ref 0.3–1.2)
Total CHOL/HDL Ratio: 2.6
Total Protein: 6.9 g/dL (ref 6.0–8.3)
Triglycerides: 54 mg/dL (ref <150)
VLDL: 11 mg/dL (ref 0–40)
WBC: 5.8 10*3/uL (ref 4.0–10.5)

## 2008-05-23 ENCOUNTER — Ambulatory Visit (HOSPITAL_COMMUNITY): Admission: RE | Admit: 2008-05-23 | Discharge: 2008-05-23 | Payer: Self-pay | Admitting: Internal Medicine

## 2008-05-23 IMAGING — MG MM DIGITAL SCREENING BILAT
5 series · 5 of 5 positions shown · non-contrast
Comparison: none

DG SCREEN MAMMOGRAM BILATERAL
Bilateral CC and MLO view(s) were taken.
Technologist: KATARELO, RT, RM

DIGITAL SCREENING MAMMOGRAM WITH CAD:
There are scattered fibroglandular densities.  No masses or malignant type calcifications are 
identified.

[R CC]
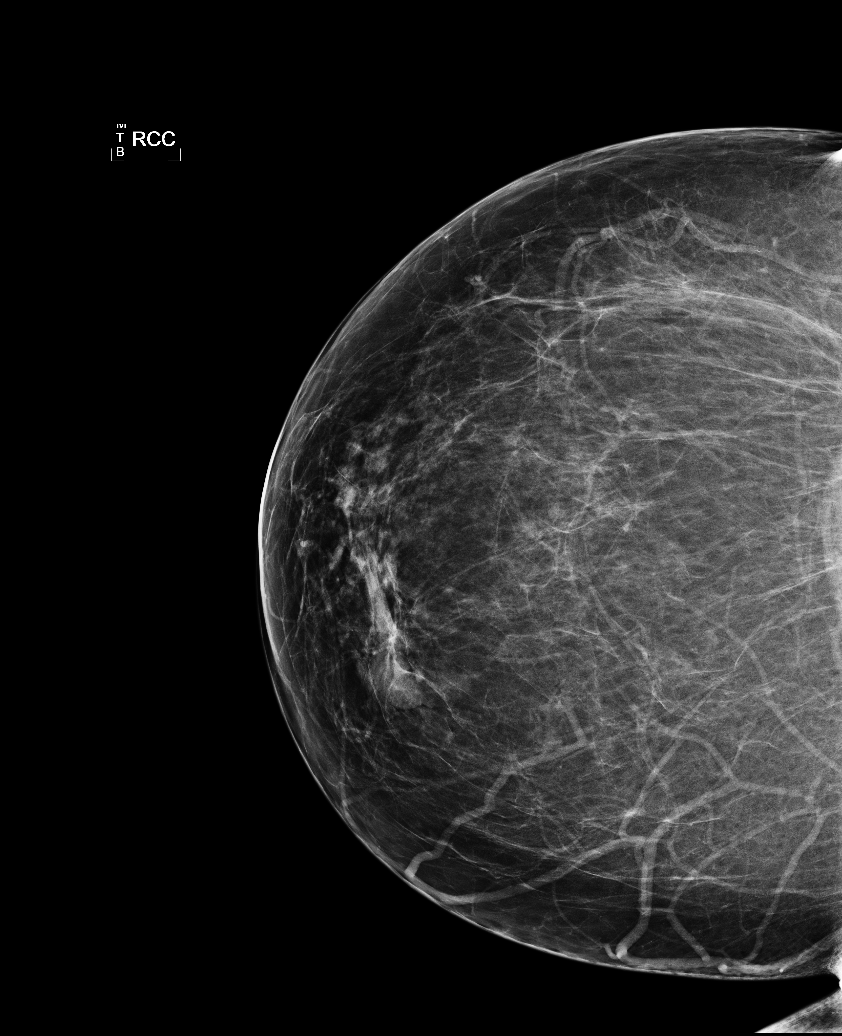

[R MLO]
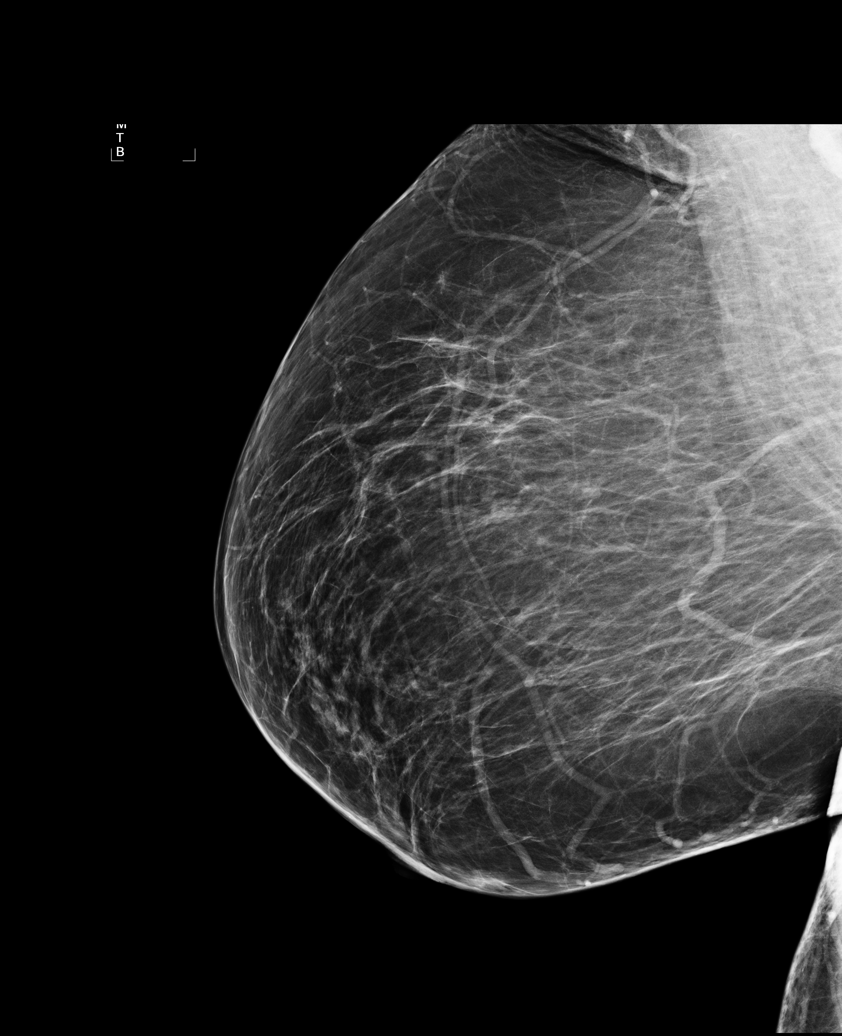

[L CC]
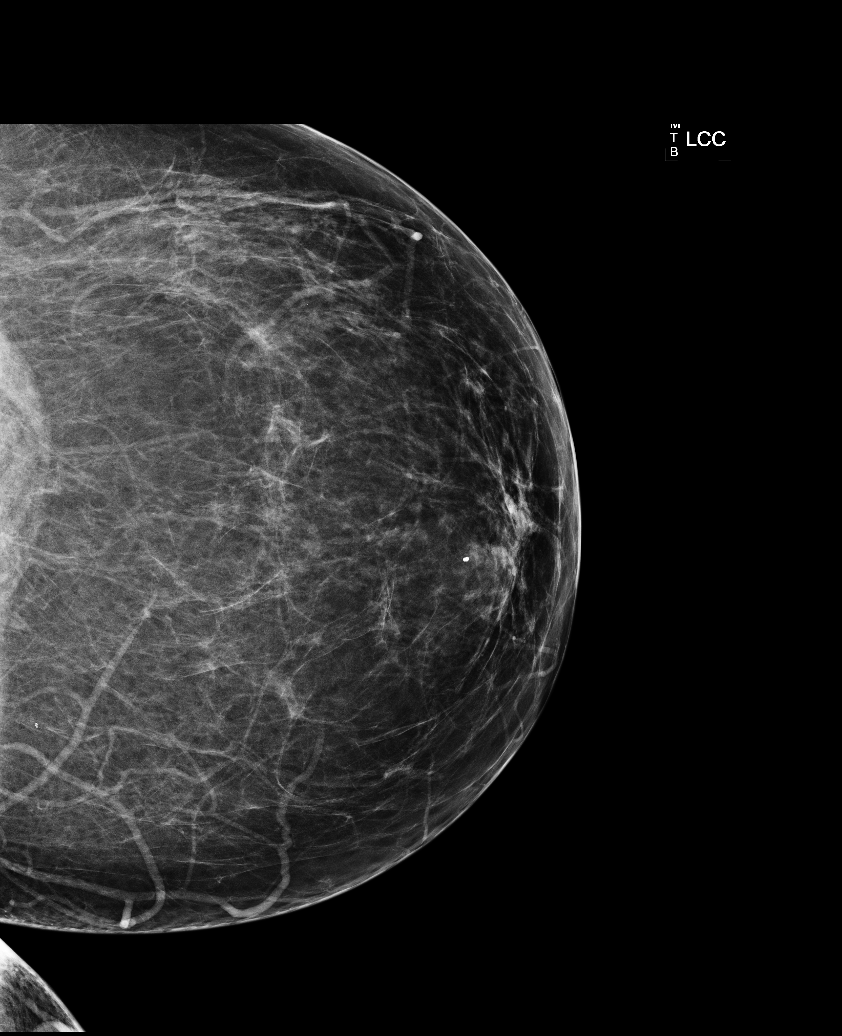

[L MLO]
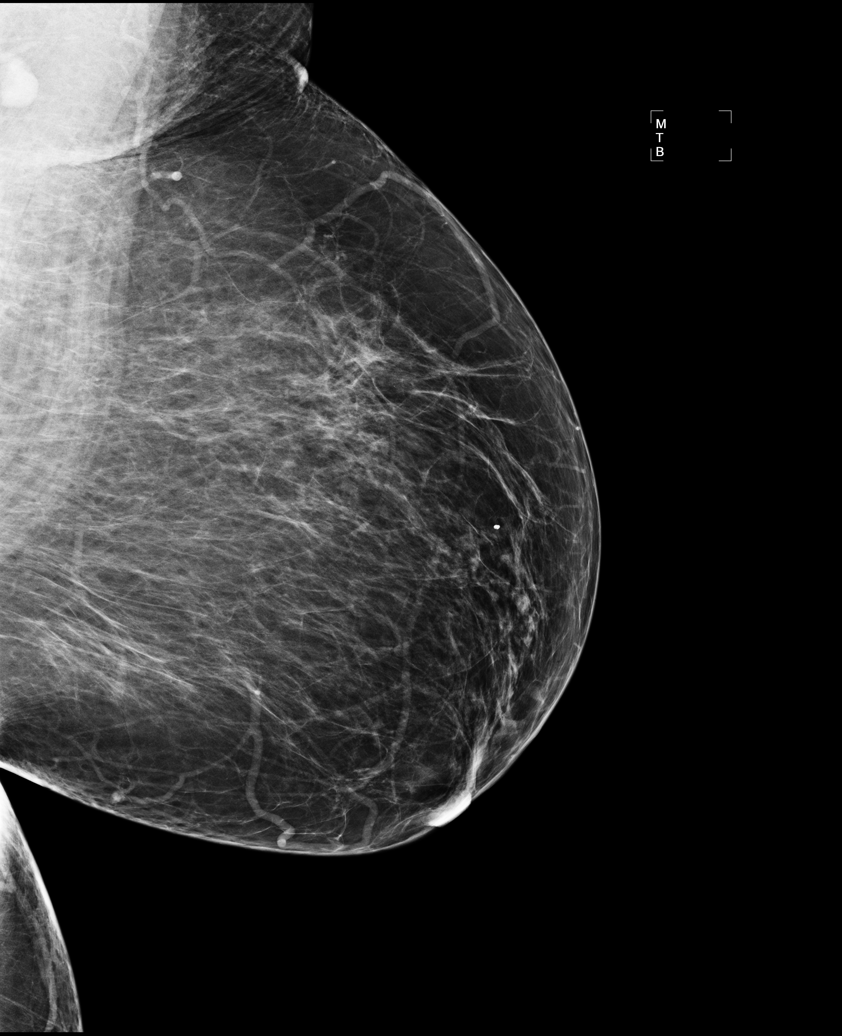

[L XCCL]
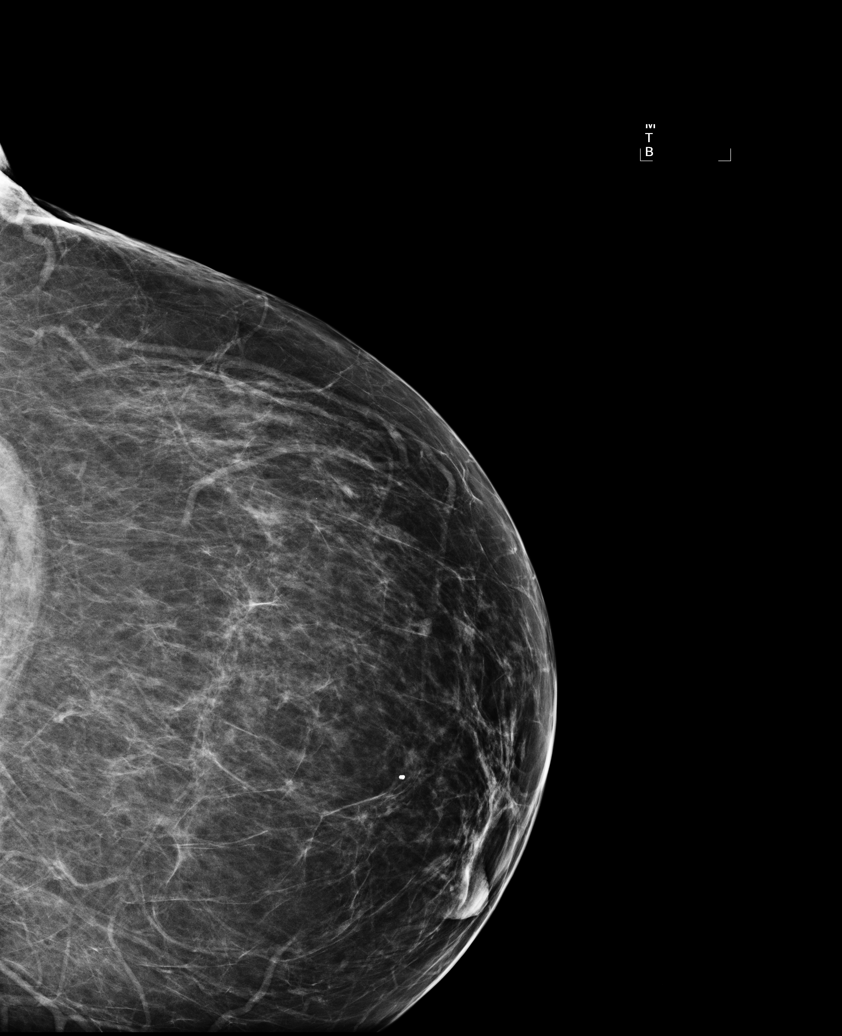

[5 of 5 positions shown; findings below may reference images not displayed]

IMPRESSION: No specific mammographic evidence of malignancy.  Next screening mammogram is recommended in one 
year.

ASSESSMENT: Negative - BI-RADS 1

Screening mammogram in 1 year.
ANALYZED BY COMPUTER AIDED DETECTION. , THIS PROCEDURE WAS A DIGITAL MAMMOGRAM.

## 2008-10-11 ENCOUNTER — Ambulatory Visit: Payer: Self-pay | Admitting: Family Medicine

## 2008-11-05 ENCOUNTER — Ambulatory Visit: Payer: Self-pay | Admitting: Internal Medicine

## 2008-11-05 ENCOUNTER — Encounter (INDEPENDENT_AMBULATORY_CARE_PROVIDER_SITE_OTHER): Payer: Self-pay | Admitting: Internal Medicine

## 2008-11-05 LAB — CONVERTED CEMR LAB
ALT: 94 units/L — ABNORMAL HIGH (ref 0–35)
AST: 137 units/L — ABNORMAL HIGH (ref 0–37)
Albumin: 4.4 g/dL (ref 3.5–5.2)
Alkaline Phosphatase: 54 units/L (ref 39–117)
BUN: 20 mg/dL (ref 6–23)
Band Neutrophils: 0 % (ref 0–10)
Basophils Absolute: 0 10*3/uL (ref 0.0–0.1)
Basophils Relative: 0 % (ref 0–1)
CO2: 26 meq/L (ref 19–32)
Calcium: 9.6 mg/dL (ref 8.4–10.5)
Chloride: 99 meq/L (ref 96–112)
Creatinine, Ser: 0.85 mg/dL (ref 0.40–1.20)
Eosinophils Absolute: 0.1 10*3/uL (ref 0.0–0.7)
Eosinophils Relative: 2 % (ref 0–5)
Glucose, Bld: 96 mg/dL (ref 70–99)
HCT: 42.3 % (ref 36.0–46.0)
Hemoglobin: 13.9 g/dL (ref 12.0–15.0)
Lymphocytes Relative: 40 % (ref 12–46)
Lymphs Abs: 2.2 10*3/uL (ref 0.7–4.0)
MCHC: 32.9 g/dL (ref 30.0–36.0)
MCV: 95.7 fL (ref 78.0–100.0)
Monocytes Absolute: 0.4 10*3/uL (ref 0.1–1.0)
Monocytes Relative: 8 % (ref 3–12)
Neutro Abs: 2.7 10*3/uL (ref 1.7–7.7)
Neutrophils Relative %: 50 % (ref 43–77)
Platelets: 205 10*3/uL (ref 150–400)
Potassium: 4.3 meq/L (ref 3.5–5.3)
RBC: 4.42 M/uL (ref 3.87–5.11)
RDW: 14.1 % (ref 11.5–15.5)
Sodium: 138 meq/L (ref 135–145)
Total Bilirubin: 0.6 mg/dL (ref 0.3–1.2)
Total Protein: 8.4 g/dL — ABNORMAL HIGH (ref 6.0–8.3)
WBC: 5.4 10*3/uL (ref 4.0–10.5)

## 2008-11-06 ENCOUNTER — Encounter (INDEPENDENT_AMBULATORY_CARE_PROVIDER_SITE_OTHER): Payer: Self-pay | Admitting: Internal Medicine

## 2008-11-06 LAB — CONVERTED CEMR LAB
HCV Ab: NEGATIVE
Hep A Total Ab: POSITIVE — AB
Hep B Core Total Ab: POSITIVE — AB
Hep B E Ab: POSITIVE — AB
Hep B S Ab: NEGATIVE

## 2010-03-07 ENCOUNTER — Emergency Department (HOSPITAL_COMMUNITY): Admission: EM | Admit: 2010-03-07 | Discharge: 2010-03-07 | Payer: Self-pay | Admitting: Emergency Medicine

## 2010-06-26 ENCOUNTER — Emergency Department (HOSPITAL_COMMUNITY)
Admission: EM | Admit: 2010-06-26 | Discharge: 2010-06-26 | Payer: Self-pay | Source: Home / Self Care | Admitting: Family Medicine

## 2010-11-11 LAB — POCT I-STAT, CHEM 8
BUN: 12 mg/dL (ref 6–23)
Calcium, Ion: 1.16 mmol/L (ref 1.12–1.32)
Chloride: 100 mEq/L (ref 96–112)
Creatinine, Ser: 0.7 mg/dL (ref 0.4–1.2)
Glucose, Bld: 121 mg/dL — ABNORMAL HIGH (ref 70–99)
HCT: 43 % (ref 36.0–46.0)
Hemoglobin: 14.6 g/dL (ref 12.0–15.0)
Potassium: 3.9 mEq/L (ref 3.5–5.1)
Sodium: 138 mEq/L (ref 135–145)
TCO2: 30 mmol/L (ref 0–100)

## 2011-01-12 NOTE — H&P (Signed)
NAMETAKEELA, DRUMMEY NO.:  [id number]   MEDICAL RECORD NO.:  [id number]          PATIENT TYPE:  EMS   LOCATION:  ED                           FACILITY:  [facility]   PHYSICIAN:  Cherene Altes, M.D.DATE OF BIRTH:  04-Jun-1956   DATE OF ADMISSION:  11/08/2007  DATE OF DISCHARGE:                              HISTORY & PHYSICAL   PRIMARY CARE PHYSICIAN:  [facility].   CHIEF COMPLAINT:  Syncope.   PRESENT ILLNESS:  Ms. [name] is a pleasant 55 year old female  with a significant prior history of alcohol abuse and fatty liver.  She  states that she has had a 1 week history of significant nausea.  There  is been no vomiting.  There has been, however, significant decreased  p.o. intake of both solids and liquids due to the patient's nausea.  There has been no diarrhea or vomiting.  There has been no abdominal  pain.  Today she was at work.  She stood from a sitting position, walked  across the room and entered the elevator.  As the elevator went down to  the basement, she began to feel cloudy vision.  She then began to have  the sense that the room was getting dark.  As the elevator doors opened,  she called out for help.  Two coworkers ran to her side and she lost  consciousness.  They sat her up in the chair.  She apparently awoke  spontaneously within a number of seconds.  When she woke up, she was  aware of where she was and did remember what happened.  She had no  complaints of chest pain whatsoever.  She did, however, suffer with a  significant headache which she has had off and on in a bifrontal  distribution for approximately 1 week now.  She was extremely  diaphoretic.  The patient was then transported to [facility] Emergency  Room for evaluation.   In the emergency room she has continued to have a headache.  She feels  extremely dizzy when standing.  She feels generally weak.  She still has  no chest pain and there is no shortness of breath.  She has  not had  similar symptoms in the past.  She states that she has not had a drop of  alcohol to drink for greater than 3 weeks now and I am inclined to  believe her.   REVIEW OF SYSTEMS:  Comprehensive review of systems is unremarkable with  the exception of positive elements noted in the present illness above  medication.   PAST MEDICAL HISTORY:  1. Infected right fourth finger, status post I&D May 2007.  2. Limited episode of metabolic acidosis, May [id number], possibly secondary      to nausea, vomiting and diarrhea.  3. History of alcohol abuse with previous admission to [facility] for detox with the patient reporting no drink of any      kind for 3 weeks.  4. Fatty liver secondary to above, May 2007.  5. Diverticulosis with history of diverticulitis, November 2006.  6. Status post left salpingo-oophorectomy, 1970.  7. Appendectomy 2003.  8. Morbid obesity.   MEDICATIONS:  None.   ALLERGIES:  NO KNOWN DRUG ALLERGIES.   FAMILY HISTORY:  The patient's mother is alive but suffers with  hypertension.  The patient's father died from a stroke at age 50.   SOCIAL HISTORY:  The patient does not smoke.  She works as a Training and development officer at  [employer].  She lives in [place].  She has two children whom she cares for  as well as two grandchildren.   DATA REVIEWED:  A CBC is unremarkable.  CMET reveals low sodium of 133,  borderline low bicarbonate 19.  Electrolytes otherwise balanced.  Creatinine is elevated at 1.17 representing a GFR of approximately 55.  AST is elevated to 37, ALT elevated at 110, total bili is 3.2.  Urinalysis reveals greater than 80 ketones, 30 of protein, small  leukocyte esterase and 3-6 white blood cells. A 12-lead EKG reveals  sinus tachycardia at 110 beats per minute.  A CT scan of the head  reveals no acute disease.   PHYSICAL EXAMINATION:  VITAL SIGNS:  Temperature 98.3, blood pressure  117/65, heart rate 130.  CBG is 112, respiratory 20, oxygen saturation   normal on room air.  GENERAL:  Well-developed, well-nourished female in no acute respiratory  distress.  HEENT:  Normocephalic, atraumatic.  Pupils equal, round and reactive to  light and accommodation.  Extraocular muscles intact bilaterally.  OC/OP  clear.  NECK:  No JVD.  No lymphadenopathy, no thyromegaly.  LUNGS:  Clear to auscultation bilaterally without wheezes or rhonchi.  CARDIOVASCULAR:  Regular rate and rhythm with a 3/6 holosystolic murmur  heard in all fields with no gallop or rub.  ABDOMEN:  Morbidly obese, soft.  Bowel sounds present.  No  hepatosplenomegaly, no rebound or ascites, nontender, nondistended.  EXTREMITIES:  No significant cyanosis, clubbing, or edema bilateral  lower extremities.  NEUROLOGIC:  Alert and oriented x4.  Cranial II-XII intact bilaterally,  5/5 strength bilateral upper and lower extremities.  Intact sensation  and touch throughout.  No Babinski.   IMPRESSION AND PLAN:  1. Syncope.  Clinically this patient appears to be significantly      dehydrated.  This is supported by a slight metabolic acidosis, a      low sodium, in mild renal insufficiency and symptoms of      orthostasis.  The etiology of her dehydration appears to be nausea      which has limited her p.o. intake for approximately 7 days.  This      is most likely cause of the patient's syncopal spell.  We will      hydrate the patient aggressively.  Given the fact that she does      have a holosystolic murmur that is not been previously described,      we will obtain an echocardiogram.  It is likely, however, this is      simply a murmur associated with the turbulence of dehydration/low      volume state.  We will check a TSH.  We will monitor the patient on      telemetry empirically.  2. Holosystolic murmur.  Please see note above.  Echocardiogram to be      accomplished.  3. Urinary tract infection.  The patient has what appears to be a      significant urinary tract infection.   This is likely the cause for  nausea which ultimately probably led to her dehydration and,      therefore, syncope.  We will treat her empirically with Rocephin 1      gram IV daily and follow up urine culture.  4. Previous history of alcoholism.  The patient reports that she is no      longer using alcohol.  She states that this is secondary to the      fact that she now has a daughter who is mentally handicapped who      requires her sure almost      constant tension.  She states it has been 3 weeks since her last      drink. I will empirically dose her with thiamine and folate during      her stay, but I do believe her when she says that she has not been      drinking as of late.  I congratulated her on this.      Cherene Altes, M.D.  Electronically Signed     [id number]/[id number]  D:  11/08/2007  T:  11/09/2007  Job:  [id number]   cc:   Myriam Jacobson  Fax: [phone]

## 2011-01-12 NOTE — Consult Note (Signed)
NAMEETHELYNE, LINCE               ACCOUNT NO.:  [id number]   MEDICAL RECORD NO.:  [id number]          PATIENT TYPE:  INP   LOCATION:  [id number]                         FACILITY:  [facility]   PHYSICIAN:  Tory Emerald. Benson Norway, MD    DATE OF BIRTH:  10-07-1955   DATE OF CONSULTATION:  11/12/2007  DATE OF DISCHARGE:                                 CONSULTATION   This is an unassigned patient.   REASON FOR CONSULTATION:  Abnormal liver enzymes.   HISTORY OF PRESENT ILLNESS:  This is a 55 year old female with a past  medical history of heavy alcohol abuse, morbid obesity, diverticulitis  status post left salpingo-oophorectomy and status post appendectomy who  was admitted to the hospital with complaints of weakness.  The patient  stated that she had 1 week of significant nausea and vomiting, and  subsequently her p.o. intake decreased, and because of her persistent  symptoms she presented to the emergency room for further evaluation and  treatment.  Because of her symptoms, she was admitted to the hospital  for further evaluation and treatment.  She was found to be dehydrated  and subsequently responded to IV fluids.  With persistent workup, she  was noted to have elevation in her transaminases.  An acute hepatitis B  and C panel were obtained from the patient which was noted to be  negative.  Additionally, she was checked for her ferritin, and that was  markedly elevated at 3000.   HOME MEDICATIONS:  She denies taking any prescription or herbal  medications.   ALLERGIES:  NO KNOWN DRUG ALLERGIES.   REVIEW OF SYSTEMS:  As stated above in the history of present illness;  otherwise, negative.   FAMILY HISTORY:  Noncontributory.   SOCIAL HISTORY:  Positive for alcohol use.  She works as a Training and development officer at  __________.   PHYSICAL EXAMINATION:  VITAL SIGNS:  Blood pressure is 130/87, heart  rate is 85, respirations 18, temperature is 98.2.  GENERAL:  The patient is in no acute distress, alert and  oriented.  HEENT:  Normocephalic, atraumatic.  Extraocular muscles intact.  NECK:  Supple.  No lymphadenopathy.  LUNGS:  Clear to auscultation bilaterally.  CARDIOVASCULAR:  Regular rate and rhythm.  ABDOMEN:  Morbidly obese, soft, nontender, nondistended.  Positive bowel  sounds.  EXTREMITIES:  No clubbing, cyanosis or edema.   LABORATORY VALUES:  White blood cell count is 3.8, hemoglobin 12.1, MCV  96.2, platelets at 169.  Sodium is 138, potassium 3.8, chloride 102, CO2  of 30, glucose 135, BUN 7, creatinine 0.6.  Total bilirubin is 1.4,  alkaline phosphatase is 35, AST 366, ALT is 258.  Iron levels 56.  Ferritin is 3000.  Hepatitis B and C negative.   IMPRESSION:  1. Abnormal liver enzymes.  2. Morbid obesity.  3. Alcohol abuse.   Her current laboratory values can be consistent with alcohol abuse,  although there is a fluctuation with an AST high in the 500 range which  is not consistent with an alcoholic hepatitis.  Another underlying  etiology is the possibility of fatty liver.  This was  commented on an  ultrasound performed in 2007, although a current ultrasound is negative  for evidence of fatty liver.  Given the magnitude of the elevations in  her transaminases, further evaluation with a serology should be  undertaken.  Plan at this time is to check for an AMA, AFA, __________,  P-ANCA, IgG and alpha one antitrypsin.      Tory Emerald Benson Norway, MD  Electronically Signed     PDH/MEDQ  D:  11/12/2007  T:  11/12/2007  Job:  [id number]

## 2011-01-12 NOTE — Discharge Summary (Signed)
[name], [name] NO.:  [id number]   MEDICAL RECORD NO.:  [id number]          PATIENT TYPE:  INP   LOCATION:  [id number]                         FACILITY:  [facility]   PHYSICIAN:  Vernell Leep, MD     DATE OF BIRTH:  September 28, 1955   DATE OF ADMISSION:  11/08/2007  DATE OF DISCHARGE:                               DISCHARGE SUMMARY   DATE OF ADMISSION:  November 08, 2007.   DATE OF DISCHARGE:  November 13, 2007.   PRIMARY MEDICAL DOCTOR:  Is at [facility]   DISCHARGE DIAGNOSIS:  1. Syncope.  2. Hepatitis - differential diagnosis alcoholic hepatitis versus NASH      versus rule out other etiology.  3. E coli urinary tract infection.  4. Hypertension.  5. Hypomagnesemia.  6. Alcohol dependence.  7. Dehydration - resolved.  8. Pancytopenia  9. Obesity.  10.Hyperlipidemia.   DISCHARGE MEDICATIONS:  1. Thiamine 100 mg p.o. daily.  2. Folate 1 mg p.o. daily.  3. Enteric-coated aspirin 81 mg p.o. daily.  4. Norvasc 10 mg p.o. daily.  5. Clonidine 0.1 mg p.o. twice daily.  6. Cipro 500 mg p.o. twice daily x3 days.   PROCEDURES.:  1. Ultrasound of the abdomen.  Impression:  A.  Stable left renal      parenchymal calcification.  B.  Otherwise negative abdominal      ultrasound.  2. CT of the head without contrast.  Impression:  Negative non      contrast head CT.  3. Echocardiogram. Summary:  There is hyperdynamic left ventricular      function.  There is no SAM of the mitral valve and no LVOT      obstruction.  There is a late spiking signal from mid cavitary      obstruction (56 mmHg).  There is suggestion of some diastolic      dysfunction.  Overall left ventricular systolic function was      hyperdynamic.  Left ventricular ejection fraction was estimated to      be 80%.  There was no left ventricular regional wall motion      abnormalities.  Left ventricular wall thickness was mildly      increased.  Normal aortic valve and mitral valve.  4. Carotid Dopplers.   Summary:  Mild intimal thickening throughout      bilaterally with an isolated AVF plaque of the anterior wall of the      left bifurcation area.  Vertebral artery flow antegrade      bilaterally.  No significant right or left internal carotid artery      stenosis.   Her pertinent labs are alpha 1 antitrypsin is 144, magnesium 1.9, most  recent hepatic panel with total protein 6.1, albumin 3.1, AST 366, ALT  258, alk phos 35, total bilirubin 1.4, CBCs with hemoglobin of 12.1,  hematocrit 35.1, white blood cell 3.8, platelets 169,000, serum ferritin  3000.  Basic metabolic panel unremarkable with BUN 7, creatinine 0.68,  serum iron of 56.  Urine culture E-coli which is pansensitive.  Acute  hepatitis panel was negative.  Serum  folate of 10.1, vitamin B12, 1997,  TSH of 1.580.  Lipid panel with cholesterol 233, triglyceride 88, HDL  88, LDL 127, VLDL 18, BNP of 50.3, lipase 22.  Urinalysis with many  bacteria and 0 to 2 white blood cells.  Point of care cardiac markers  were negative.   CONSULTATIONS:  GI Dr. Benson Norway.   HOSPITAL COURSE AND PATIENT DISPOSITION:  Please refer to the history  and physical note for initial admission details.  In summary, [name] is a pleasant 55 year old African American female patient  with history of alcohol dependence and fatty liver who presented with 1  week history of nausea and decreased oral intake of liquids and solids.  On the day of admission on standing from sitting to standing position  and walking, she had cloudy vision and sensation of place going dark,  and then subsequently blacked out.  She was also diaphoretic.  Further  evaluation in the emergency room revealed patient had creatinine of  1.17, sodium of 133, AST 37, ALT 110, total bilirubin of 3.2 and  urinalysis suggestive of UTI .  She will also had a tachycardia of 130  and blood pressure of 117/65.  She was thereby admitted to a telemetry  bed for evaluation and management of  her syncope.  Her syncope was  initially thought to be secondary to her dehydration and volume  depletion.   1. Syncope.  The patient was admitted to telemetry bed with no      significant alarms.  Extensive evaluation including CT head,      carotid Dopplers, and echocardiogram have been done with findings      as above.  The patient has had no further episodes and has been      ambulating.  This was most likely secondary to her dehydration.  2. E. coli urinary tract infection.  The patient will complete a      course of antibiotics.  3. Hypertension.  Medications have been started and adjusted with much      better control.  4. Hypomagnesemia, which was repleted.  5. Hepatitis - the patient was noted to have markedly elevated LFTs      with peaking of AST at 535 and ALT of 212.  She usually drank half      a bottle of vodka until 6 months ago, and since 6 months, she says      she drinks a fifth of vodka every day.  Her last drink was 2 weeks      ago.  Differential diagnosis for this hepatitis picture could be      secondary to her alcohol dependence and early cirrhosis versus NASH      secondary to alcohol and obesity, versus ruling out other      etiologies.  GI was consulted and Dr. Benson Norway saw the patient, and      indicated that she obviously has to abstain from alcohol and lose      weight, and from their standpoint, she is stable for discharge to      be followed up as an outpatient in 2 weeks at [facility].  To      consider liver biopsy as an outpatient.  6. Alcohol dependence with no overt withdrawal symptoms.  The patient      has been counseled regarding      abstinence.  She will be referred to an outpatient alcohol      abstinence program.  7. Dehydration resolved.  8. Pancytopenia which might have been secondary to her alcohol toxic      effect and improved.      Vernell Leep, MD  Electronically Signed     AH/MEDQ  D:  11/13/2007  T:  11/13/2007  Job:   [id number]   cc:   Myriam Jacobson  Fax: [phone]   Tory Emerald. Benson Norway, MD  Fax: [phone]

## 2011-01-15 NOTE — Discharge Summary (Signed)
[name], [name]:  [id number]   MEDICAL RECORD NO.:  [id number]          PATIENT TYPE:  IPS   LOCATION:  0300                          FACILITY:  [facility]   PHYSICIAN:  Rulon Eisenmenger, M.D. DATE OF BIRTH:  [birth year]   DATE OF ADMISSION:  03/02/2005  DATE OF DISCHARGE:  03/08/2005                                 DISCHARGE SUMMARY   IDENTIFYING DATA:  This is a 55 year old married African-American female  voluntarily admitted with a history of alcohol abuse, drinking vodka,  drinking a fifth daily, requiring detox, having withdrawal symptoms,  vomiting, passed out at work, taken to the emergency room, requiring  professional help, asking for help.   ADMISSION MEDICATIONS:  None.   ALLERGIES:  No known drug allergies.   PHYSICAL AND NEUROLOGICAL EXAMINATION:  Performed at [facility] Emergency  Department.  The patient received IV fluids.   MENTAL STATUS EXAM:  Obese female somewhat disheveled, little eye contact,  kept falling asleep during interview.  The patient's speech soft, feeling  tired.  Affect restricted, thought process goal directed, judgment and  insight were impaired, and poor impulse control.   ADMISSION DIAGNOSES:  AXIS I:  Alcohol dependence, partial withdrawal  syndrome, possibly sedation from detox protocol.  AXIS II:  Deferred.  AXIS III:  None.  AXIS IV:  Other psychosocial problems related to chronic alcohol use.  AXIS V:  40/55.   HOSPITAL COURSE:  The patient was admitted and placed on a detox protocol,  CIWAs monitored as indicated along with vital signs, pushing fluids,  individual and group therapy, dual diagnosis, and relapse prevention plan.  The patient participated in treatment, tolerated detox, required  stabilization on medications and p.r.n.'s for withdrawal symptoms.  The  patient assisted with psychosocial issues and internal medicine was seen  regarding alcoholic hepatitis, hypokalemia, hypo magnesium and liver  enzymes.  The patient reported positive response to crisis intervention,  participated in aftercare planning and was arranged to follow up with  [facility], and was discharged in improved  condition with withdrawal symptoms, no dangerous ideation, motivation to be  compliant with the aftercare plan, given medication education, and  discharged on:  1.  Trazodone 50 mg q.h.s.  2.  Librium 25 mg daily for 3 days, to take copy of discharge instructions      to follow up with [facility] and walk-in assessment with Dr. Darylene Price at [facility] July 13 at 11:30, and Monday-Friday 1-4 walk-in at [facility].  The      patient was discharged in improved condition, no risk issues, no acute      withdrawal issues, condition improved.   DISCHARGE DIAGNOSES:  AXIS I:  Alcohol dependence, partial withdrawal  syndrome, possibly sedation from detox protocol.  AXIS II:  Deferred.  AXIS III:  None.  AXIS IV:  Other psychosocial problems related to chronic alcohol use.  AXIS V:  Global assessment of function on discharge was 55.       JEM/MEDQ  D:  04/08/2005  T:  04/09/2005  Job:  [id number] 

## 2011-01-15 NOTE — Discharge Summary (Signed)
[place]  Patient:    [name], [name]                        MRN: [id number] Adm. Date:  [id number] Disc. Date: [id number] Attending:  Tiburcio Pea Dictator:   Crissie Sickles, M.D.                           Discharge Summary  ADMISSION DIAGNOSES: 1. Hyperglycemia. 2. Paroxysmal supraventricular tachycardia. 3. Pancreatitis, acute. 4. Hypertension. 5. Anemia. 6. Alcohol abuse.  DISCHARGE DIAGNOSES: 1. Hyperglycemia. 2. Paroxysmal supraventricular tachycardia. 3. Pancreatitis, acute. 4. Hypertension. 5. Anemia. 6. Alcohol abuse. 7. Hypercholesterolemia. 8. Gastroesophageal reflux disease. 9. Bacterial vaginosis.  DISCHARGE MEDICATIONS: 1. Toprol XL 50 mg one tab q.d. 2. Hydrochlorothiazide 12.5 mg one tab q.d. 3. Pepcid 20 mg one tab b.i.d. 4. Multivitamin one q.d. 5. Flagyl 500 mg one pill b.i.d. for one week.  RESIDENT:  Dr. Pamella Pert.  CONSULTS:  None.  PROCEDURES: 1. CT of the abdomen showed diffuse fatty infiltration of the liver.  Also had    findings consistent with mild acute focal pancreatitis involving the distal    head and the ______ process region.  No definite gallstones and no    evidence of biliary or pancreatic ductal dilatation. 2. CT scan of the kidneys showed a 6 mm calcification within the posterior    aspect of the left mid kidney, probably representing a benign process.  A    sequential six month followup CT was recommended to assess change. 3. Abdominal ultrasound.  Impression was mild fatty infiltrate of the liver    and a 9 mm hyperechoic lesion within the mid/upper left kidney,    nonconstrast CT of the kidneys recommended. 4. CT pelvis:  Unremarkable.  HISTORY AND PHYSICAL:  For complete H&P please see resident H&P in chart. Briefly, this was a 55 year old obese African-American woman with a history of intermittent alcohol abuse who had presented with about three days of intractable nausea,  vomiting, and upper abdominal pain.  She developed palpitations and chest pain at home associated with severe weakness. Evaluation by EMS revealed SVT and she responded to 6 mg Adenosine given in the field.  On further evaluation in the emergency department the patient had a blood pressure of 180/80, pulse of 130-140, was afebrile, and had an O2 saturation of 98% on room air.  She was in no distress.  PHYSICAL EXAMINATION  HEENT:  Poor dentition, muddy sclerae, no icterus or jaundice.  CARDIOVASCULAR:  Regular rhythm with tachycardia and a 2/6 systolic ejection murmur at the left sternal border.  ABDOMEN:  Diffusely tender, soft, bowel sounds present, but hypoactive.  No rebound.  No masses.  RECTAL:  Normal with guaiac negative stool.  LABORATORIES:  EKG showed sinus tachycardia in the 130s with no ST or T-wave changes and no Q-waves.  Portable chest x-ray showed no acute disease.  Further laboratory evaluation was consistent with pancreatitis.  The patient was admitted to step-down unit.  HOSPITAL COURSE:  #1 - PANCREATITIS:  The patient gradually had improvement of her nausea, vomiting, and abdominal pain after being restricted to n.p.o. and supplemented with IV fluids.  She gradually had her diet advanced from clear liquids to full liquids to a regular diet by the time of discharge.  Her abdominal pain was initially treated with morphine and this was soon discontinued and she was able to be controlled  with Tylenol only after the first 24-36 hours of hospitalization.  No definite cause of the pancreatitis was determined, although the patient did have one alcoholic drink the night before symptoms started.  Her imaging studies in the hospital did not reveal any gallstones. The mildly fatty infiltrated liver is suggestive of chronic alcohol abuse.  By day of discharge patient was tolerating a regular diet and had minimal abdominal pain.  #2 - PAROXYSMAL SUPRAVENTRICULAR  TACHYCARDIA:  After presentation to the ED and admission to the hospital she did not have any further arrhythmias.  She did have sinus tachycardia for the first 12-24 hours of hospitalization but this normalized after IV hydration and decrease in her anxiety.  After 24 hours she was discontinued from telemetry.  Cardiac enzymes were negative x 3 and patients TSH was within normal limits.  Electrolytes did reveal a low bicarbonate, but otherwise were normal.  #3 - HYPERGLYCEMIA:  The patient does not have a diagnosis of diabetes.  This hyperglycemia normalized with IV hydration.  Hemoglobin A1C was 4.8%.  This hyperglycemia was most likely secondary to stress of illness and acute pancreatitis.  #4 - HYPERTENSION:  Blood pressures remained moderately elevated throughout the hospitalization.  She was begun on Lopressor 25 mg b.i.d. and this was increased to 50 mg b.i.d. and hydrochlorothiazide was added prior to discharge.  #5 - ANEMIA:  Patient did have hemoglobin of 13.3 on admission and this decreased to 10.9 after several days of IV hydration.  Stool guaiac was negative.  Iron studies and B12 and folate were all within normal limits. Follow-up as an outpatient and consider bone marrow suppression due to chronic alcohol use or even consider malignancy if platelets remain low and hemoglobin remains low.  #6 - GASTROESOPHAGEAL REFLUX DISEASE:  Patient did present with some epigastric pain that was presumed to be associated with her pancreatitis. However, she did give a history of GERD symptoms at home and her symptoms here did respond to Pepcid.  She was discharged home to continue Pepcid and this should be followed to see if this problem improves as an outpatient.  #7 - HYPERCHOLESTEROLEMIA:  Initial lipid profile done on the day of admission (not fasting) showed a cholesterol of 362, LDL of 170 with an HDL of 169 and triglycerides of 114.  Repeat of the laboratories the following day  showed a total cholesterol of 277, triglycerides 85, HDL 141, LDL 119.  These  laboratories will need to be repeated as an outpatient but it is probable that the patient has hyperlipidemia and will need medication.  #8 - ALCOHOL ABUSE:  Patient did give a history of alcohol abuse which had been severe up until approximately one year ago.  She did undergo detox in January 2001.  Since then her drinking has been intermittent and she reports it only being one to two drinks per occasion.  She did express some interest in outpatient treatment and she was to try to set this up on her own with [facility] as soon as possible.  She did not undergo any signs of alcohol withdrawal during this hospitalization.  The patient also had a urine drug screen here which was positive for THC.  No other drugs of abuse were detected.  #9 - BACTERIAL VAGINOSIS:  Positive wet prep.  Patient was started on Flagyl prior to discharge and instructed to continue this medication as an outpatient.  DISPOSITION:  Patient was discharged home in stable and much improved condition on the previously mentioned  discharge medications.  No restrictions were placed on activity.  It was recommended she drink no alcohol and try to adhere to a low salt diet.  She was instructed to call alcohol and drug services as soon as possible to set up outpatient rehabilitation and also was instructed to call [facility] to set up an appointment with a physician there for outpatient medical management. DD:  01/23/01 TD:  01/23/01 Job: [id number]

## 2011-01-15 NOTE — Consult Note (Signed)
[name], [name]               ACCOUNT NO.:  [id number]   MEDICAL RECORD NO.:  [id number]          PATIENT TYPE:  INP   LOCATION:  [id number]                         [facility]ACILITY:  [facility]   PHYSICIAN:  Sheral Apley. Weingold, M.D.DATE OF BIRTH:  [birth year]   DATE OF CONSULTATION:  01/10/2006  DATE OF DISCHARGE:                                   CONSULTATION   REQUESTING PHYSICIAN:  Dr. Delfina Redwood.   REASON FOR CONSULTATION:  This is a 55 year old female who presents today  with a 3- to 4-day history of pain and swelling in the distal aspect of the  right ring finger.  She is right-hand-dominant.  She was admitted for what  appeared to be nausea and vomiting.  This was done on Jan 07, 2006 and was  worked up for this problem, but also had a fair amount of swelling over the  ring finger, dominant right hand, for the past 48-72 hours.   FAMILY MEDICAL HISTORY:  Her family medical history is significant for  diabetes and hypertension.   SOCIAL HISTORY:  She does not smoke or drink alcohol excessively.  No  history of illicit drug use.   ALLERGIES:  She has no known drug allergies.   MEDICATIONS:  She is not on any regular medications.   REVIEW OF SYSTEMS:  Review of systems is significant for nausea, vomiting  and epigastric-type pain.   PHYSICAL EXAMINATION:  GENERAL:  A well-nourished female, pleasant, alert  and oriented x3.  EXTREMITIES:  On examination of her right ring finger, she had an obvious  paronychia which goes from radial to ulnar side of the eponychial fold.  She  also has what appears to be a possible felon.  X-rays are pending.   DESCRIPTION OF PROCEDURE:  She was given a 0.25% plain Marcaine digital  block and I&D was performed of the paronychia on the radial side of the ring  finger as well as I&D of the eponychial fold.  Copious amounts of purulent  material was expressed and cultured.  It was then dressed with 4 x 4's and  Coban wrap.   INDICATION:  The patient is to  start b.i.d. warm soapy soaks for 20 minutes.  She can continue on the current antibiotics and I will follow up with her in  my office in the next 5-7 days.      Sheral Apley Burney Gauze, M.D.  Electronically Signed     MAW/MEDQ  D:  01/10/2006  T:  01/11/2006  Job:  [id number]

## 2011-01-15 NOTE — Discharge Summary (Signed)
[name], BERNDT NO.:  [id number]   MEDICAL RECORD NO.:  [id number]          PATIENT TYPE:  INP   LOCATION:  [id number]                         FACILITY:  [facility]   PHYSICIAN:  Deborra Medina, M.D.     DATE OF BIRTH:  [birth year]   DATE OF ADMISSION:  06/06/2005  DATE OF DISCHARGE:  06/15/2005                                 DISCHARGE SUMMARY   ci   DISCHARGE DIAGNOSIS:  1.  Diverticulitis.  2.  Anemia.  3.  Hypertension.  4.  Hypokalemia.   DISCHARGE MEDICATIONS:  Cipro 500 mg p.o. q.12h. for four days, Flagyl 500  mg p.o. q.12h. for four days, HCTZ 25 mg daily, K-Dur 20 mEq p.o. daily,  Lopressor 50 mg p.o. b.i.d., Famotidine 40 mg p.o. b.i.d.   DISPOSITION:  Discharged to home.   CONDITION ON DISCHARGE:  Improved.   DISCHARGE INSTRUCTIONS:  She is to follow up with Dr. Posey Pronto.   PROCEDURES PERFORMED:  1.  CT of the abdomen without contrast on June 06, 2005, was negative, the      study was somewhat degraded by her large body habitus.  She also had a      CT of the pelvis with contrast on October 8, probable diverticulitis in      the distal descending and proximal sigmoid colon, some extraluminal air.      No abscess was seen.  Follow up recommended to insure that the area      clears and a neoplasm is not present.  2.  CT of the abdomen and pelvis with contrast on October 12, abdomen was      unremarkable, stable appearance since October 8, pelvis had probable      diverticulitis with developing abscess near the descending sigmoid      junction.  The inflammatory process was increased since previous scan,      but does not appear to be a well defined drainable collection.  Follow      up is recommended as a perforated carcinoma can have a similar      appearance.  3.  CT of the abdomen and pelvis with contrast on October 16, no acute      findings, no obstructing renal stone, interval improvement in the      pericolonic inflammatory changes involving  the distal descending and      sigmoid colon.  There is extraluminal air without discrete fluid      collection to suggest abscess.  Adjacent reactive lymph nodes.   CONSULTATIONS:  Merri Ray. Grandville Silos, M.D.   ADMISSION HISTORY AND PHYSICAL:  [name] is a 55 year old African American  female who came to the ED by her personal vehicle complaining of a one week  history of abdominal pain/cramping and general malaise.  The patient  typically has 1-2 soft stools per day but has been constipated during this  time.  She took a laxative four days prior to admission and had numerous  small volume tapioca consistency stools for approximately 24 hours.  The  patient subsequently experienced relief of abdominal pain for  36 hours but  on the day of admission she felt experienced acute onset of explosive pain  while sitting on the toilet accompanied by one episode of non-bloody, non-  bilious emesis.   ALLERGIES:  She has no known drug allergies.   PAST MEDICAL HISTORY:  Alcohol abuse x 30 years up to 1/5 daily, obesity,  status post left salpingo-oophorectomy in 1970, appendectomy in August 2003.   SOCIAL HISTORY:  She has never smoked.  Alcohol:  She drinks 1/5 a day for  approximately the past 30 years.  No illicit drug use except for marijuana  on an occasional basis.  She is married but she is separated.  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  She lives with her sister.   PHYSICAL EXAMINATION:  On the day of admission, temperature 97.3, blood  pressure 185/104, pulse 96 to 99, respirations 24, O2 saturations 98% on  room air.  General:  Obese African American female in no acute distress.  Eyes:  Extraocular movements intact, pupils equal, round, reactive to light,  she has cataracts bilaterally, she has marginal dentition, her pharynx was  clear.  Neck:  Right submandibular node 2-3 cm which was nontender.  Respiratory:  Lungs were clear to auscultation bilaterally but had decreased  breath sounds  in the bases.  CV:  Regular rate and rhythm.  GI:  Abdomen was  obese, no edema.  Suprapubic region had some tenderness.  She had hypoactive  bowel sounds.  Extremities:  No edema.  1+ pedal pulses bilaterally and 1+  radial pulses.  Skin was intact to obvious lesions.  Lymphs as in ENT,  otherwise, no acute nodes palpated.  5/5 strength in the upper and lower  extremities bilaterally.  Cranial nerves 2-12 were intact.  Psychiatric:  She is alert and oriented x 3.   ADMISSION LABORATORY DATA:  Significant for hemoglobin 11.8.  LFTs were  normal.  Lipase 19.  Her BMP was normal.  Her UA had small amount of blood,  ketones 15, leukocyte esterase was small, she had 3-6 white blood cells,  positive mucous.   HOSPITAL COURSE:  Problem 1:  Abdominal pain.  The patient's abdominal pain was attributed to  diverticulitis with possible microperforation as suggested by admission lab  data and CT scan.  Upon admission, the patient was made NPO, and empiric  antiobiotic therapy with Cipro and Flagyl was initiated.  General Surgery  was consulted and agreed that medical therapy and close observation for anu  surgical indications.  FOBT was negative x 4.  The patient became febrile on  October 11 and October 12 with T-max 101.1 and 100.8, respectively,  accompanied by new leukocystosis.  Repeat CT scan on October 12 showed  increasing process near descending/sigmoid junction.  By October 13, the  patient had considerable clinical improvement with decreased pain,  resolution of fevers and leukocytosis.  After resolution of fevers and pain,  she was switched to a liquid diet and oral antiobiotics.  Repeat CT on the  day prior to discharge showed interval improvement in pericolonic  inflammatory changes.  On the day of discharge, the patient was able to  tolerate full diet, denied any abdominal pain, and had a formed stool.  The patient was discharged with p.o. Cipro, Flagyl for four days antibiotic   course to total 14 days.  Follow up with Dr. Posey Pronto.   Problem 2:  Hypertension.  On admission, the patient's blood pressure was  185/104 felt possibly secondary to  pain, however, the patient's blood  pressure remained persistently elevated despite improvement in pain, no  history of hypertension for patient.  She has not been to a physicians for  checkup in awhile.  Put on Lopressor 5 mg IV b.i.d. on October 10, increased  to q.6h. on October 11 after blood pressure reached 205/113.  The patient  switched to p.o. hydrochlorothiazide once diet changed from n.p.o. to liquid  on June 11, 2005.  Hypertension well controlled for duration of stay, on  the day of discharge, 24 hour blood pressure range was 114-132/65-75.  The  patient discharged on HCTZ and Lopressor.  Follow up with Dr. Posey Pronto.   Problem 3:  Anemia.  On admission, the patient's hemoglobin was 13.3,  decreased to 10.6 on October 9.  MCV normal, making this normocytic anemia.  FOBT was negative x 4.  Ferritin, B12, folate, and TSH within normal limits,  etiology possibly dilutional.  Hemoglobin improved to 11.9 the day before  discharge.   Problem 4:  Hypokalemia.  On admission, the patient's potassium was 4.  the  patient's potassium decreased to 3.1 on October 9.  KCL supplementation  began resulting in improvement in potassium level.  Magnesium was 1.7 on  October 14, magnesium supplementation given, and magnesium improved to 1.9  on October 16.  On the day of discharge, potassium 3.9.  Hypokalemia likely  due to decreased p.o. intake during hospitalization.  Because the patient is  discharged on HCTZ for hypertension, gave patient treatment for KCL 20 mEq  p.o. daily.  Further assessment with BMP during follow up with Dr. Posey Pronto.   DISCHARGE LABS AND VITAL SIGNS:  Temperature 97.9, blood pressure 132/65,  heart rate 75, respiratory rate 20, O2 saturations 97% at room air.  White  blood cell count 6.8, hemoglobin 11.9,  hematocrit 35.5, platelets 503, MCV  88.4, RDW 13.5.  Sodium 141, potassium 3.9, chloride 100, bicarb 30, BUN 6,  creatinine 0.9, glucose 119, calcium 9.8.      Erma Heritage, M.D.      Deborra Medina, M.D.  Electronically Signed    TE/MEDQ  D:  07/05/2005  T:  07/05/2005  Job:  [id number]

## 2011-01-15 NOTE — Discharge Summary (Signed)
[name], [name]               ACCOUNT NO.:  [id number]   MEDICAL RECORD NO.:  [id number]          PATIENT TYPE:  INP   LOCATION:  37                         FACILITY:  [facility]   PHYSICIAN:  Melissa L. Lovena Le, MD  DATE OF BIRTH:  1956-04-15   DATE OF ADMISSION:  01/07/2006  DATE OF DISCHARGE:  01/14/2006                                 DISCHARGE SUMMARY   CHIEF COMPLAINT AT TIME OF ADMISSION:  Nausea, vomiting and diarrhea.   DISCHARGING DIAGNOSES:  1.  Metabolic acidosis likely secondary to extensive nausea, vomiting and      diarrhea.  The patient was admitted and received bicarbonate and      intravenous fluid hydration with correction of her metabolic acidosis.      It was felt this constellation of symptoms might be secondary to an      infected finger.  2.  Infected right fourth digit.  The patient underwent evaluation for soft      tissue infection of the tip of her right finger, likely starting as a      paronychia.  The patient was seen by Dr. Burney Gauze and his associates.      She underwent an incision and drainage of the area and was treated with      three-times-daily soaks of the area as well as antibiotics.  She was      initially started on Zosyn  When it became apparent that the organisms      in the wound were gram-positive, vancomycin was added until      sensitivities could be obtained.  Ultimately, the organism was      identified a coagulase-negative Staph.  At the time of discharge, the      wound remains slightly erythematous with no obvious collections.  She      was responding to therapy with soaking and therefore I elected to      continue coverage of the soft tissue with a quinolone until she sees Dr.      Burney Gauze next week.  At this time, the patient is limited in the      antibiotic which she can afford and therefore I have elected to place      her on Cipro 500 mg b.i.d., which she can obtain at [facility] for $4.      The patient has taken the  initiative to make an appointment with      [facility] on Monday and will follow up with them as well.  3.  Hypertension.  Because the patient had such severe nausea and vomiting,      she was started on a Catapres patch, which we continued at the time of      discharge; in fact, the patch was changed on the day of discharge to      allow her 7 days to see [facility] and decide on a better regime for      her.  I would therefore request that the physician seeing her make a      decision as to whether transdermal medication will be convenient  for her      from a financial standpoint or whether oral therapy would be      appropriate.  On the clonidine patch, she has maintained adequate blood      pressure control.  4.  History of alcohol abuse.  She states that she had been cutting back as      of July of 2006.  Initially, she was treated with a multivitamin,      thiamine and folate; I have discontinued this at the time of discharge.      Should she continue to partake in alcohol use and consumption, it would      be recommended that a supplemental regime be resumed.  The patient      appears well-grounded in her sobriety at this time.  5.  Abnormal uterine bleeding.  During the course of this hospital stay, the      patient developed uterine bleeding which she has not had in the last 3-5      years.  She has not seen a gynecologist in some time; I therefore have      asked her to make an outpatient appointment for a pelvic exam and      possible ultrasound of the uterus to assure that her endometrial stripe      is not enlarged.  She has made those arrangements at the time of      discharge.  6.  Abnormal liver enzymes.  Over the course of the hospital stay, the      patient's AST and ALT became quite elevated.  She was evaluated and      found to have fatty liver with no further decrease in her liver enzymes.      A Gastroenterology consult was obtained.  An autoimmune workup was       undertaken, which showed no obvious autoimmune disease to explain her      symptoms.  Therefore, it was felt that possibly her history of      alcoholism may be playing a role in this picture.  She was to follow up      as an outpatient with Dr. Michail Sermon.  Please note, she also had hepatic      hepatitis panels drawn, which were within normal limits.   MEDICATIONS AT TIME OF DISCHARGE:  1.  Clonidine 0.2-mg patch every 7 days; her patch was changed on the day of      discharge.  2.  Percocet 5/325 mg one tablet every 4 hours as needed for pain.  3.  Cipro 500 mg p.o. b.i.d.   FOLLOWUP:  The patient had already made an appointment to follow up with  [facility] and had arrangements for a gynecological appointment.  She will  follow up with Dr. Burney Gauze on Monday.   HOSPITAL COURSE:  The patient was admitted initially to the ICU for  management of her metabolic acidosis.  She was treated with 2 ampules of  bicarbonate, IV fluid hydration and treatment for infection.  Her nausea,  vomiting and diarrhea were worked up with stool cultures, which showed no C.  difficile, ova or parasite or other organism.  She symptomatically improved  with the correction of her metabolic acidosis and treatment of the right  ring finger soft tissue infection.  At the time of discharge, she had no  further nausea, vomiting or diarrhea.  X-ray of the hand and soft tissue  showed obvious abscess; therefore, a consult with the hand  surgeons was  undertaken on Jan 10, 2006.  She did undergo an incision and drainage of the  finger and was treated with aggressive soaks and IV antibiotics starting  with Zosyn.  When it became apparent that the organism was a gram-positive  cocci representing Staph, vancomycin was added.  Further speciation revealed  that this was a coagulase-negative Staph organism and therefore her antibiotics were changed to cover soft tissue infection with a quinolone.  As stated, the course of  her admission was complicated by an elevated liver  enzymes panel, which was evaluated by GI.  Her hepatitis panel was negative.  Her autoimmune workup also appeared to be negative at the time of discharge.   DISCHARGE PHYSICAL EXAM:  VITAL SIGNS:  On the day of discharge, her vital  signs were stable.  Temperature was 97.8, blood pressure was 132/162/70-91.  She did have an aberrant one-time blood pressure of 184, which with repeat  was within normal limits.  Her blood sugars ranged from 106-130.  GENERAL:  She is generally an obese African American female in no acute  distress.  HEENT:  She is normocephalic, atraumatic.  Pupils equal, round and reactive  to light.  Extraocular muscles are intact.  Mucous membranes are moist.  NECK:  Supple.  There is no JVD and there were no carotid bruits.  CHEST:  Clear to auscultation.  There were no rhonchi, rales or wheezes.  CARDIOVASCULAR:  Regular rate and rhythm, positive S1 and S2, no S3 or S4.  No murmurs, rubs, or gallops were noted.  EXTREMITIES:  Extremities show right ring finger with minimal erythema and  swelling.  An area of incision and drainage was obvious.  There was no  obvious recollection or reaccumulation of abscess formation.   LABORATORY VALUES:  Pertinent laboratory values during the course of the  hospital stay revealed her wound culture was coagulase-negative Staph, her  smooth muscle antibody was within normal limits, her mitochondrial  antibodies were within normal limits at 0.1.  Her last hepatic function  revealed an AST of 266, an ALT of 283.  Her CK level was 50 with an ANA that  was negative.  Her urine culture was no growth.  Her stool cultures showed  no Salmonella, Shigella, Campylobacter or Yersinia.  Her  LDH was 398, a  little bit high.  Her iron studies were all within normal limits.  Her  ferritin was 853, which was slightly elevated.  Her fecal occult testing was  negative.  Ova and parasite were negative.   Fecal wbc's were none seen.  Her  hemoglobin A1c was 5.4.  Her total cholesterol was slightly elevated at 346  with triglycerides of 112 and LDL of 225 and HDL of 97.  In light of the  elevation in her liver enzymes, I have not started her on a cholesterol  agent; we will ask the physicians at [facility] to keep this in mind and  follow her with serial studies and dietary counseling.   CONDITION ON DISCHARGE:  At the time of discharge, the patient was deemed  stable for followup as an outpatient with Dr. Burney Gauze and [facility].      Melissa L. Lovena Le, MD  Electronically Signed     MLT/MEDQ  D:  01/15/2006  T:  01/15/2006  Job:  [id number]   cc:   Sheral Apley Burney Gauze, M.D.  Fax: [phone]   [facility]

## 2011-01-15 NOTE — Consult Note (Signed)
[name], ALFSON               ACCOUNT NO.:  [id number]   MEDICAL RECORD NO.:  [id number]          PATIENT TYPE:  INP   LOCATION:  5739                         FACILITY:  [facility]   PHYSICIAN:  Merri Ray. Grandville Silos, M.D.DATE OF BIRTH:  [birth year]   DATE OF CONSULTATION:  06/06/2005  DATE OF DISCHARGE:                                   CONSULTATION   REASON FOR CONSULTATION:  Sigmoid diverticulitis.   HISTORY OF PRESENT ILLNESS:  Patient is a 55 year old African American  female who complains of abdominal pain for approximately six days.  She came  to the emergency room today as she was unable to have a bowel movement.  She  has been passing some gas and she ate okay up to and including yesterday.  Work-up in the ed included white blood cell count which was 8000.  She is  afebrile.  CT scan of the abdomen and pelvis was obtained.  This  demonstrates sigmoid diverticulitis with some bubbles of extraluminal air.   PAST MEDICAL HISTORY:  Morbid obesity.   PAST SURGICAL HISTORY:  Left salpingo-oophorectomy in the 1970s.   CURRENT MEDICATIONS:  None.   ALLERGIES:  No known drug allergies.   PHYSICAL EXAMINATION:  GENERAL APPEARANCE:  She is awake and alert.  VITAL SIGNS:  Temperature 97.3, blood pressure 185/104, pulse 99,  respirations 24, saturations 98% on room air.  HEENT:  Sclerae are clear bilaterally.  NECK:  Supple.  LUNGS:  Clear to auscultation.  CARDIOVASCULAR:  Regular.  ABDOMEN:  Markedly obese.  Bowel sounds are active.  She has some tenderness  in the left lower quadrant in the suprapubic area without peritoneal signs.  SKIN:  Warm and dry with no rashes.   LABORATORY DATA:  White blood cell count 8, hemoglobin 11.8.  Liver function  tests within normal limits.  Urinalysis had small leukocyte esterase but  otherwise unremarkable.   CT scan is as above.   IMPRESSION:  1.  Sigmoid diverticulitis.  2.  Morbid obesity.  3.  Hypertension.   RECOMMENDATIONS:  I  agree with medical admission with management of her  hypertension.  I also recommend IV antibiotics, either Cipro and Flagyl or  Zosyn along with bowel rest.  We will follow her very closely with you.  If  she worsens, she may require resection with a temporary colostomy.  This  will be very difficult due to the patient's morbid obesity.  The above plan  was discussed in detail with the patient and her family.  Questions were  answered.      Merri Ray Grandville Silos, M.D.  Electronically Signed     BET/MEDQ  D:  06/06/2005  T:  06/07/2005  Job:  [id number]

## 2011-01-15 NOTE — H&P (Signed)
[name], [name]               ACCOUNT NO.:  [id number]   MEDICAL RECORD NO.:  [id number]          PATIENT TYPE:  INP   LOCATION:  0105                         FACILITY:  [facility]   PHYSICIAN:  Benito Mccreedy, M.D.DATE OF BIRTH:  11-16-1955   DATE OF ADMISSION:  01/07/2006  DATE OF DISCHARGE:                                HISTORY & PHYSICAL   CHIEF COMPLAINT:  Nausea, vomiting, and diarrhea for two days.   HISTORY OF PRESENT ILLNESS:  The patient is a 55 year old African American  lady who presents with the above complaint.  According to the patient, she  has not traveled neither has she been drinking anywhere where there was  parasite exposure.  Two days ago, she started having nausea and vomiting  with diarrhea which was profuse, watery, not bloody, and was progressively  worsened and therefore, she came to the ED.  She had some abdominal  discomfort which had resolved by the time of evaluation.  She continues to  have fever, chills, hematochezia, hemoptysis, cough, chest pain, shortness  of breath.  In the emergency room, the patient was seen by the ED physician  and workup revealed severe metabolic acidosis with bicarb below 10 and we  were called for admission.  The patient has a history of hypertension and  diverticulosis.   FAMILY HISTORY:  Positive for diabetes and hypertension.   SOCIAL HISTORY:  The patient is a Training and development officer.  She does not smoke cigarettes or  drink alcohol.  No history of illicit drug use.   ALLERGIES:  The patient does not have any medication allergies.   MEDICATIONS:  She is not on any regular medications.   REVIEW OF SYMPTOMS:  Remarkable for nausea, vomiting, diarrhea, epigastric  pain.   PHYSICAL EXAMINATION:  VITAL SIGNS:  Blood pressure 162/99, temperature 97, heart rate 111,  respirations 23, O2 saturation 96% on room air.  GENERAL:  The patient was not in acute cardiopulmonary distress.  She was  heaving with attempts at vomiting.  NECK:   Supple, no JVD.  HEENT:  Oropharynx moist, no scleral pallor or icterus.  LUNGS:  Clear to auscultation.  CARDIAC:  Patient mildly tachycardic without any gallops.  ABDOMEN:  Soft, no epigastric tenderness appreciated, bowel sounds present.  There was no rebound tenderness.  EXTREMITIES:  No edema, no cyanosis.  CNS:  Nonfocal.   LABORATORY DATA:  WBC 13.7, hemoglobin 15.3, hematocrit 46, MCV 85.6,  platelet count 265.  Sodium 138, potassium 4.6, chloride 99, CO2 8, glucose  108, BUN 12, creatinine 1.7, calcium 9.7, total protein 9.6, albumin 5.1,  AST 47, ALT 46, alkaline phos 52, bilirubin 3.3, lipase 30.  pH 7.136, pCO2  17.5, pO2 115,  bicarb 5.6, this was on room air.   IMPRESSION:  Severe metabolic acidosis secondary to GI loss of potassium  with nausea, vomiting, and diarrhea.  The reason for patient's GI symptoms  unclear.  The patient will be admitted for IV supplementation with bicarb  repletion.  We will follow her bicarb levels by checking frequent metabolic  channels.  The patient has not been in contact  with any significant  antibiotic treatment, therefore, C. diff will not be checked.  The patient  is apparently hemodynamically stable and she was admitted to the ICU for  continued observation and care.  She is obese.  Will check a fasting lipid  panel level.  Further recommendations will be made based on the patient's  response.      Benito Mccreedy, M.D.  Electronically Signed     GO/MEDQ  D:  01/07/2006  T:  01/07/2006  Job:  [id number]

## 2011-01-15 NOTE — Consult Note (Signed)
NAMEMARIEM, [name]:  [id number]   MEDICAL RECORD NO.:  [id number]          PATIENT TYPE:  INP   LOCATION:  [id number]                         FACILITY[facility]  [facility]   PHYSICIAN:  Lear Ng, MDDATE OF BIRTH:  [birth year]   DATE OF CONSULTATION:  01/11/2006  DATE OF DISCHARGE:                                   CONSULTATION   REASON FOR CONSULTATION:  Abnormal liver function tests.   HISTORY OF PRESENT ILLNESS:  This consult was completed at the request of  Dr. Billey Chang in regards to the patient's abnormal liver function  tests. The patient is a 55 year old morbidly obese black female who was  admitted on Jan 07, 2006 secondary to nausea, vomiting and diarrhea x2 days.  She was found at that time to have severe metabolic acidosis and was  admitted for further evaluation. During subsequent management of her  acidosis she began having severe pain in her right fourth finger and  underwent incision and drainage of that area by hand surgery.  On the day of  admission her AST was 47, ALT was 46, alkaline phosphatase was normal at 62  with an elevated bilirubin of 3.3. Her bilirubin has trended down to normal  at 1.0 but AST and ALT have risen to 322 and 205 with a normal alkaline  phosphatase of 40. She had an ultrasound done which showed a normal  gallbladder and biliary tree but diffuse increased echogenicity of the liver  consistent with diffuse fatty infiltration.   The patient reports chronic alcohol abuse up until July of 2006, describing  drinking a fifth of liquor a day for over 30 years. She denies any family  history of liver disease and denies any previous problems with liver  disease.   PAST MEDICAL HISTORY:  1.  Morbid obesity.  2.  Alcohol abuse (reports decreasing it significantly in July, 2006).   MEDICATIONS:  None on admission and denies NSAIDs. Reports Benadryl p.r.n.   ALLERGIES:  No known drug allergies.   FAMILY HISTORY:  Positive  for diabetes and hypertension, denies liver  disease.   SOCIAL HISTORY:  Former alcohol abuse, cut down significantly in July, 2006.  Works as a Training and development officer, denies herbal therapies, drugs or cigarettes.   PHYSICAL EXAMINATION:  VITAL SIGNS:  Temperature 98.1, pulse 91, blood  pressure 122/70, O2 saturation is 100% on room air.  GENERAL:  Morbidly obese, alert, in no acute distress.  HEENT:  Anicteric sclerae.  CHEST:  Clear to auscultation bilaterally.  CARDIOVASCULAR:  Regular rate and rhythm without murmurs.  ABDOMEN:  Obese, soft, nontender, nondistended, unable to palpate the liver  due to body habitus.  EXTREMITIES:  No edema.   LABORATORY DATA:  Total bilirubin on admission 3.3, currently 1.0, AST on  admission 47, currently 322, ALT 46 on admission, currently 205, albumin 5.1  on admission, currently 3.1.  Total CK 223, CK-MB 4.6, troponin I 0.03.  White blood cell count 4900, hemoglobin 11.2, platelet count 155,000.   IMPRESSION:  A 55 year old black female who was admitted with severe  metabolic acidosis on Jan 07, 2006 and had mildly elevated liver function  tests at that time. Has developed progressive worsening of her  transaminases. Her bilirubin has normalized and alkaline phosphatase has  been normal but she has elevation of AST and ALT concerning for mild muscle  injury versus reactive hepatopathy versus a combination of those with  steatohepatitis versus meds. I doubt if she has an autoimmune disorder that  developed in this acute setting and is most likely to be secondary to her  severe metabolic acidosis and finger inflammation.  She does have known  fatty infiltration in her liver and has abused alcohol for years until mid  2006 and therefore this may be also playing a role.  Despite the likelihood  that this is reactive hepatopathy, would recommend autoimmune markers, iron  panel and LDH level. Will follow liver function tests closely in the  hospital and if tests  worsen or if autoimmune markers negative, and tests  persist at an elevated level, will consider the role of CT scan versus liver  biopsy. At this time will await further liver function testing and above  autoimmune markers.      Lear Ng, MD  Electronically Signed     VCS/MEDQ  D:  01/11/2006  T:  01/11/2006  Job:  [id number]

## 2011-01-15 NOTE — H&P (Signed)
[name], [name]:  [id number]   MEDICAL RECORD NO.:  [id number]          PATIENT TYPE:  IPS   LOCATION:  0300                          FACILITY:  [facility]   PHYSICIAN:  Rulon Eisenmenger, M.D. DATE OF BIRTH:  Jul 15, 1956   DATE OF ADMISSION:  03/02/2005  DATE OF DISCHARGE:                         PSYCHIATRIC ADMISSION ASSESSMENT   IDENTIFYING INFORMATION:  A 55 year old married African-American female  voluntarily admitted on March 02, 2005.   HISTORY OF PRESENT ILLNESS:  The patient presents with a history of alcohol  abuse, has been drinking vodka, drinking a fifth daily.  States her drinking  has been escalating.  She has been drinking for years.  The patient tried to  stop drinking on her own.  Last drink was on Saturday.  The patient was  having withdrawal  symptoms, problems with vomiting, and passed out at work.  The patient took herself to the emergency department.  She needed further  professional help.   PAST PSYCHIATRIC HISTORY:  First admission to [facility], has  been detoxed years ago.   SOCIAL HISTORY:  She is a 55 year old married African-American female,  married for 20 years, has 4 children and 2 step-children.  Children are all  adults.  Has 2 years of college.  She lives with her sister and the  patient's son.  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   FAMILY HISTORY:  Denies.   ALCOHOL DRUG HISTORY:  Nonsmoker.  The patient started drinking at the age  of 64, reports drinking in the morning, drinks at work.  Also smokes  marijuana on occasion.   PAST MEDICAL HISTORY:  Primary care [name] is none.  Medical problems:  The patient just states that she is obese, denies any other medical  problems.   MEDICATIONS:  None.   DRUG ALLERGIES:  No known allergies.   PHYSICAL EXAMINATION:  The patient was assessed at [facility] Emergency  Department where she received IV fluids.  She is an obese female,  somewhat  disheveled, appears in no acute distress, no tremors.  Temperature is 98.1,  101 heart rate, 18 respirations.  Blood pressure is 147/70, 98% saturated.  Hemoglobin 11.9, hematocrit 34.9, total bilirubin is 2.7.  AST is elevated  at 73, ALT is 52.  Urine drug screen is negative.   MENTAL STATUS EXAM:  Sleepy, obese female, little eye contact, keeps falling  asleep during the interview.  The patient's speech is soft spoken, normal  pace.  The patient feels tired.  Affect is the same.  Thought processes are  coherent.  Cognitive function intact.  Memory is fair, judgment is fair,  poor impulse control.   ADMISSION DIAGNOSES:   AXIS I:  Alcohol dependence.   AXIS II:  Deferred.   AXIS III:  None.   AXIS IV:  Other psychosocial problems related to chronic alcohol use.   AXIS V:  Current is 40, this past year 52.   PLAN:  Plan is to detox the patient.  We will work on relapse prevention.  We will look at  CIWAs and vital signs, push fluids.  The patient is to  attend individual and group therapy.  Case manager to look at any potential  rehab program.  The patient is to follow up with AA.   TENTATIVE LENGTH OF CARE:  4-5 days.       JO/MEDQ  D:  03/03/2005  T:  03/03/2005  Job:  [id number]

## 2011-05-24 LAB — URINE MICROSCOPIC-ADD ON

## 2011-05-24 LAB — COMPREHENSIVE METABOLIC PANEL
ALT: 110 — ABNORMAL HIGH
ALT: 175 — ABNORMAL HIGH
ALT: 186 — ABNORMAL HIGH
ALT: 212 — ABNORMAL HIGH
AST: 237 — ABNORMAL HIGH
AST: 290 — ABNORMAL HIGH
AST: 329 — ABNORMAL HIGH
AST: 535 — ABNORMAL HIGH
Albumin: 3.1 — ABNORMAL LOW
Albumin: 3.3 — ABNORMAL LOW
Albumin: 4
Albumin: 4.2
Alkaline Phosphatase: 32 — ABNORMAL LOW
Alkaline Phosphatase: 32 — ABNORMAL LOW
Alkaline Phosphatase: 41
Alkaline Phosphatase: 48
BUN: 11
BUN: 13
BUN: 7
BUN: 8
CO2: 19
CO2: 23
CO2: 23
CO2: 30
Calcium: 10.4
Calcium: 8.6
Calcium: 8.7
Calcium: 8.7
Chloride: 102
Chloride: 104
Chloride: 94 — ABNORMAL LOW
Chloride: 99
Creatinine, Ser: 0.67
Creatinine, Ser: 0.68
Creatinine, Ser: 0.85
Creatinine, Ser: 1.17
GFR calc Af Amer: 59 — ABNORMAL LOW
GFR calc Af Amer: >60
GFR calc Af Amer: >60
GFR calc Af Amer: >60
GFR calc non Af Amer: 49 — ABNORMAL LOW
GFR calc non Af Amer: >60
GFR calc non Af Amer: >60
GFR calc non Af Amer: >60
Glucose, Bld: 102 — ABNORMAL HIGH
Glucose, Bld: 103 — ABNORMAL HIGH
Glucose, Bld: 112 — ABNORMAL HIGH
Glucose, Bld: 135 — ABNORMAL HIGH
Potassium: 3.4 — ABNORMAL LOW
Potassium: 3.5
Potassium: 3.8
Potassium: 3.8
Sodium: 132 — ABNORMAL LOW
Sodium: 133 — ABNORMAL LOW
Sodium: 137
Sodium: 138
Total Bilirubin: 1.2
Total Bilirubin: 1.5 — ABNORMAL HIGH
Total Bilirubin: 2.4 — ABNORMAL HIGH
Total Bilirubin: 3.2 — ABNORMAL HIGH
Total Protein: 6
Total Protein: 6.3
Total Protein: 7.5
Total Protein: 8.3

## 2011-05-24 LAB — MAGNESIUM
Magnesium: 1.5
Magnesium: 1.6
Magnesium: 1.9

## 2011-05-24 LAB — POCT CARDIAC MARKERS
CKMB, poc: 1.3
Myoglobin, poc: 61
Operator id: 4295
Troponin i, poc: <0.05

## 2011-05-24 LAB — URINALYSIS, ROUTINE W REFLEX MICROSCOPIC
Glucose, UA: NEGATIVE
Glucose, UA: NEGATIVE
Hgb urine dipstick: NEGATIVE
Ketones, ur: >80 — AB
Ketones, ur: >80 — AB
Nitrite: NEGATIVE
Nitrite: NEGATIVE
Protein, ur: 30 — AB
Protein, ur: 30 — AB
Specific Gravity, Urine: 1.027
Specific Gravity, Urine: 1.028
Urobilinogen, UA: 1
Urobilinogen, UA: 1
pH: 6
pH: 6

## 2011-05-24 LAB — DIFFERENTIAL
Basophils Absolute: 0
Basophils Relative: 0
Eosinophils Absolute: 0.1
Eosinophils Relative: 1
Lymphocytes Relative: 26
Lymphs Abs: 2.2
Monocytes Absolute: 0.7
Monocytes Relative: 8
Neutro Abs: 5.2
Neutrophils Relative %: 64

## 2011-05-24 LAB — CBC
HCT: 33.6 — ABNORMAL LOW
HCT: 34.3 — ABNORMAL LOW
HCT: 37.9
HCT: 42.7
HCT: 35.1 — ABNORMAL LOW
Hemoglobin: 11.7 — ABNORMAL LOW
Hemoglobin: 11.9 — ABNORMAL LOW
Hemoglobin: 13.1
Hemoglobin: 15
Hemoglobin: 12.1
MCHC: 34.5
MCHC: 34.6
MCHC: 34.7
MCHC: 35.1
MCHC: 34.4
MCV: 95.2
MCV: 95.2
MCV: 95.4
MCV: 96
MCV: 96.2
Platelets: 138 — ABNORMAL LOW
Platelets: 141 — ABNORMAL LOW
Platelets: 164
Platelets: 86 — ABNORMAL LOW
Platelets: 169
RBC: 3.53 — ABNORMAL LOW
RBC: 3.6 — ABNORMAL LOW
RBC: 3.98
RBC: 4.44
RBC: 3.65 — ABNORMAL LOW
RDW: 12.9
RDW: 13.8
RDW: 13.8
RDW: 13.9
RDW: 14.2
WBC: 3.6 — ABNORMAL LOW
WBC: 3.6 — ABNORMAL LOW
WBC: 3.8 — ABNORMAL LOW
WBC: 8.2
WBC: 3.8 — ABNORMAL LOW

## 2011-05-24 LAB — LIPID PANEL
Cholesterol: 233 — ABNORMAL HIGH
HDL: 88
LDL Cholesterol: 127 — ABNORMAL HIGH
Total CHOL/HDL Ratio: 2.6
Triglycerides: 88
VLDL: 18

## 2011-05-24 LAB — HEPATITIS PANEL, ACUTE
HCV Ab: NEGATIVE
Hep A IgM: NEGATIVE
Hep B C IgM: NEGATIVE
Hepatitis B Surface Ag: NEGATIVE

## 2011-05-24 LAB — HEPATIC FUNCTION PANEL
ALT: 258 — ABNORMAL HIGH
AST: 366 — ABNORMAL HIGH
Albumin: 3.1 — ABNORMAL LOW
Alkaline Phosphatase: 35 — ABNORMAL LOW
Bilirubin, Direct: 0.5 — ABNORMAL HIGH
Indirect Bilirubin: 0.9
Total Bilirubin: 1.4 — ABNORMAL HIGH
Total Protein: 6.1

## 2011-05-24 LAB — ANTI-NEUTROPHIL ANTIBODY: Cytoplasmic Neutrophilic Ab: <1:20 {titer}

## 2011-05-24 LAB — B-NATRIURETIC PEPTIDE (CONVERTED LAB): Pro B Natriuretic peptide (BNP): 50.3

## 2011-05-24 LAB — TSH: TSH: 1.58

## 2011-05-24 LAB — URINE CULTURE: Colony Count: >=100000

## 2011-05-24 LAB — ALPHA-1 ANTITRYPSIN PHENOTYPE: A-1 Antitrypsin: 121 mg/dL (ref 100–200)

## 2011-05-24 LAB — VITAMIN B12: Vitamin B-12: 797 (ref 211–911)

## 2011-05-24 LAB — PROTIME-INR
INR: 1
Prothrombin Time: 13.8

## 2011-05-24 LAB — MITOCHONDRIAL ANTIBODIES: Mitochondrial M2 Ab, IgG: <20.1 Units (ref <20.1)

## 2011-05-24 LAB — ANTI-SMOOTH MUSCLE ANTIBODY, IGG: F-Actin IgG: <20 U (ref <20)

## 2011-05-24 LAB — FERRITIN: Ferritin: 3000 — ABNORMAL HIGH (ref 10–291)

## 2011-05-24 LAB — IRON: Iron: 56

## 2011-05-24 LAB — FOLATE: Folate: 10.1

## 2011-05-24 LAB — ANA: Anti Nuclear Antibody(ANA): NEGATIVE

## 2011-05-24 LAB — LIPASE, BLOOD: Lipase: 22

## 2011-05-24 LAB — ALPHA-1-ANTITRYPSIN: A-1 Antitrypsin, Ser: 144

## 2012-07-14 ENCOUNTER — Emergency Department (HOSPITAL_COMMUNITY): Payer: Self-pay

## 2012-07-14 ENCOUNTER — Encounter (HOSPITAL_COMMUNITY): Payer: Self-pay | Admitting: Emergency Medicine

## 2012-07-14 ENCOUNTER — Emergency Department (HOSPITAL_COMMUNITY)
Admission: EM | Admit: 2012-07-14 | Discharge: 2012-07-14 | Disposition: A | Discharge status: Home / Self Care | Payer: Self-pay | Attending: Emergency Medicine | Admitting: Emergency Medicine

## 2012-07-14 DIAGNOSIS — K5732 Diverticulitis of large intestine without perforation or abscess without bleeding: Secondary | ICD-10-CM | POA: Insufficient documentation

## 2012-07-14 DIAGNOSIS — Z79899 Other long term (current) drug therapy: Secondary | ICD-10-CM | POA: Insufficient documentation

## 2012-07-14 DIAGNOSIS — R110 Nausea: Secondary | ICD-10-CM | POA: Insufficient documentation

## 2012-07-14 DIAGNOSIS — K5792 Diverticulitis of intestine, part unspecified, without perforation or abscess without bleeding: Secondary | ICD-10-CM

## 2012-07-14 HISTORY — DX: Diverticulitis of intestine, part unspecified, without perforation or abscess without bleeding: K57.92

## 2012-07-14 LAB — CBC WITH DIFFERENTIAL/PLATELET
Basophils Absolute: 0 10*3/uL (ref 0.0–0.1)
Basophils Relative: 0 % (ref 0–1)
Eosinophils Absolute: 0.1 10*3/uL (ref 0.0–0.7)
Eosinophils Relative: 1 % (ref 0–5)
HCT: 36 % (ref 36.0–46.0)
Hemoglobin: 12.5 g/dL (ref 12.0–15.0)
Lymphocytes Relative: 26 % (ref 12–46)
Lymphs Abs: 2 10*3/uL (ref 0.7–4.0)
MCH: 30.6 pg (ref 26.0–34.0)
MCHC: 34.7 g/dL (ref 30.0–36.0)
MCV: 88.2 fL (ref 78.0–100.0)
Monocytes Absolute: 1.2 10*3/uL — ABNORMAL HIGH (ref 0.1–1.0)
Monocytes Relative: 15 % — ABNORMAL HIGH (ref 3–12)
Neutro Abs: 4.5 10*3/uL (ref 1.7–7.7)
Neutrophils Relative %: 58 % (ref 43–77)
Platelets: 173 10*3/uL (ref 150–400)
RBC: 4.08 MIL/uL (ref 3.87–5.11)
RDW: 13.4 % (ref 11.5–15.5)
WBC: 7.7 10*3/uL (ref 4.0–10.5)

## 2012-07-14 LAB — COMPREHENSIVE METABOLIC PANEL
ALT: 133 U/L — ABNORMAL HIGH (ref 0–35)
AST: 109 U/L — ABNORMAL HIGH (ref 0–37)
Albumin: 3.9 g/dL (ref 3.5–5.2)
Alkaline Phosphatase: 45 U/L (ref 39–117)
BUN: 13 mg/dL (ref 6–23)
CO2: 32 mEq/L (ref 19–32)
Calcium: 9.5 mg/dL (ref 8.4–10.5)
Chloride: 91 mEq/L — ABNORMAL LOW (ref 96–112)
Creatinine, Ser: 0.72 mg/dL (ref 0.50–1.10)
GFR calc Af Amer: >90 mL/min (ref >90)
GFR calc non Af Amer: >90 mL/min (ref >90)
Glucose, Bld: 122 mg/dL — ABNORMAL HIGH (ref 70–99)
Potassium: 2.9 mEq/L — ABNORMAL LOW (ref 3.5–5.1)
Sodium: 135 mEq/L (ref 135–145)
Total Bilirubin: 0.7 mg/dL (ref 0.3–1.2)
Total Protein: 7.9 g/dL (ref 6.0–8.3)

## 2012-07-14 LAB — URINE MICROSCOPIC-ADD ON

## 2012-07-14 LAB — URINALYSIS, ROUTINE W REFLEX MICROSCOPIC
Glucose, UA: NEGATIVE mg/dL
Hgb urine dipstick: NEGATIVE
Ketones, ur: 15 mg/dL — AB
Nitrite: NEGATIVE
Protein, ur: NEGATIVE mg/dL
Specific Gravity, Urine: 1.027 (ref 1.005–1.030)
Urobilinogen, UA: 1 mg/dL (ref 0.0–1.0)
pH: 6 (ref 5.0–8.0)

## 2012-07-14 LAB — LIPASE, BLOOD: Lipase: 32 U/L (ref 11–59)

## 2012-07-14 IMAGING — CT CT ABD-PELV W/ CM
1 of 3 series · 14 of 32 positions shown, 19 images · IV contrast (OMNIPAQUE 300)
Comparison: [DATE]

CLINICAL DATA: Left abdominal pain, history of diverticulitis

CT ABDOMEN AND PELVIS WITH CONTRAST
TECHNIQUE: Multidetector CT imaging of the abdomen and pelvis was
performed following the standard protocol during bolus
administration of intravenous contrast.
Contrast: 100mL OMNIPAQUE IOHEXOL 300 MG/ML  SOLN

[Series 2: abd/pel with · axial · 0.74mm/px · z∈[+1284,+1660]mm · 14 of 85 slices shown, 19 images]
[im 5/85  soft-tissue]
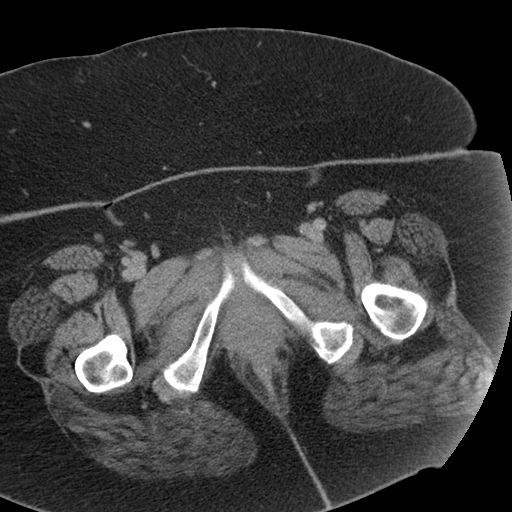
[im 5/85  bone]
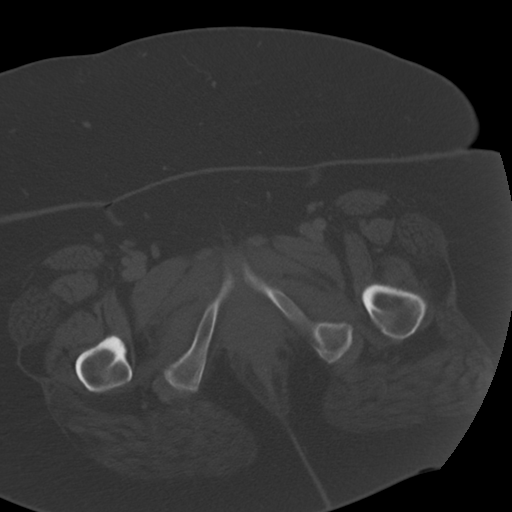
[im 14/85  soft-tissue]
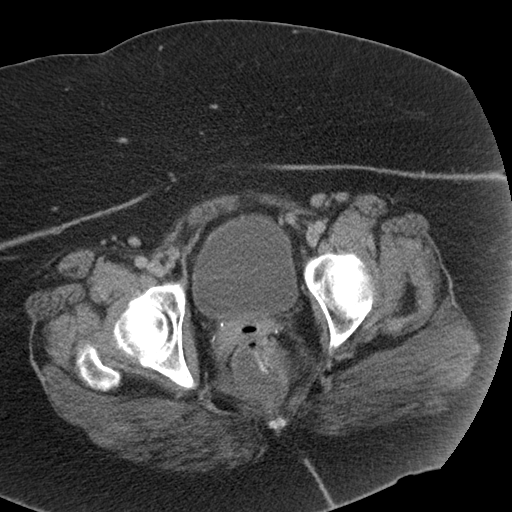
[im 18/85  soft-tissue]
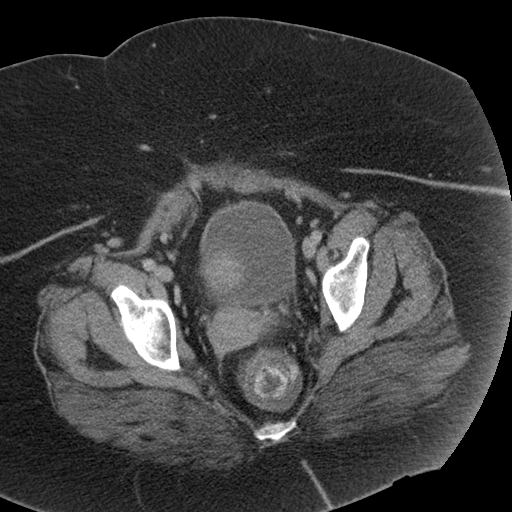
[im 23/85  soft-tissue]
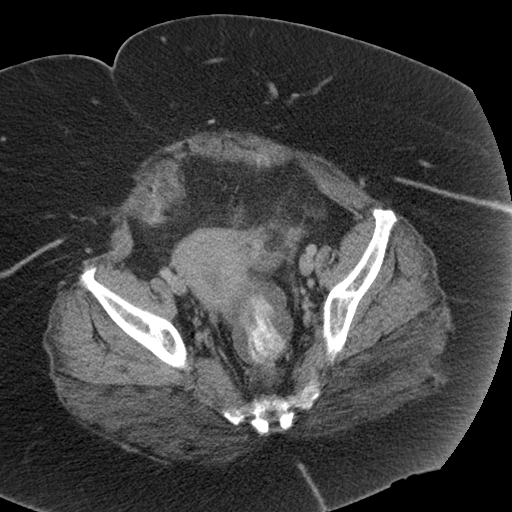
[im 31/85  soft-tissue]
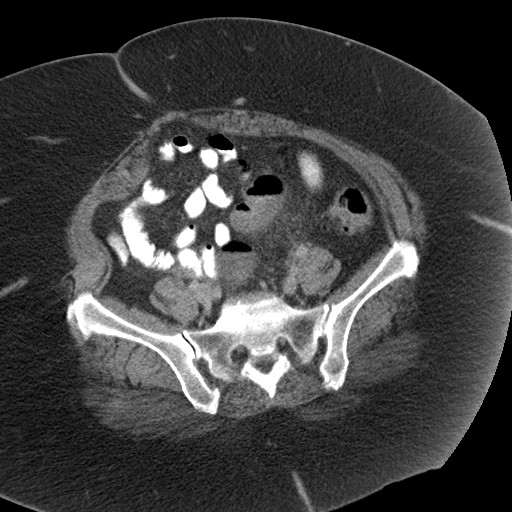
[im 36/85  soft-tissue]
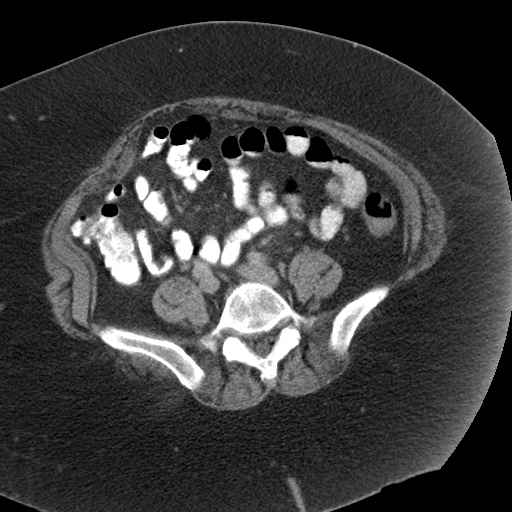
[im 45/85  soft-tissue]
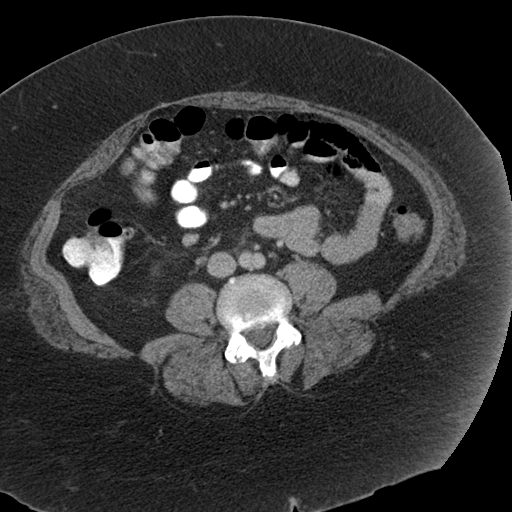
[im 49/85  soft-tissue]
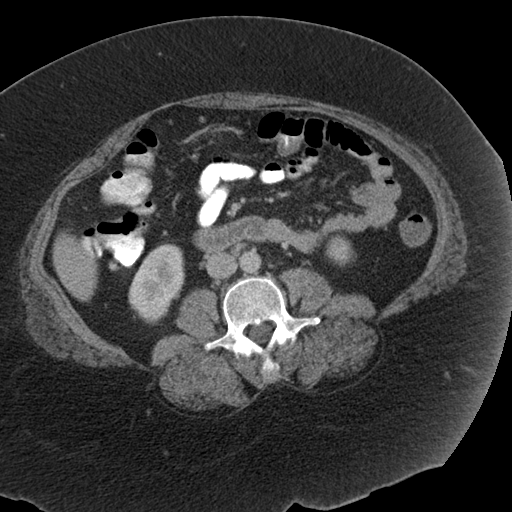
[im 54/85  soft-tissue]
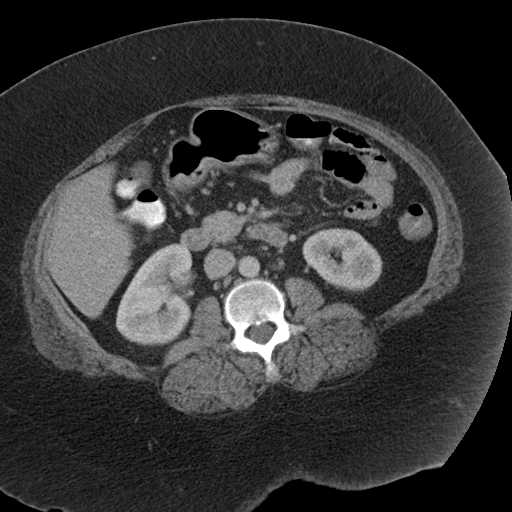
[im 54/85  bone]
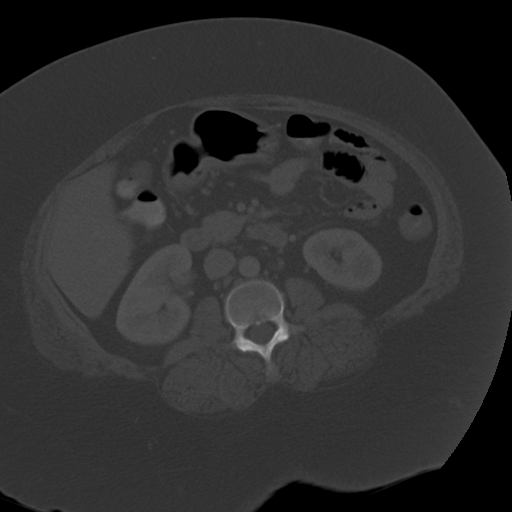
[im 62/85  soft-tissue]
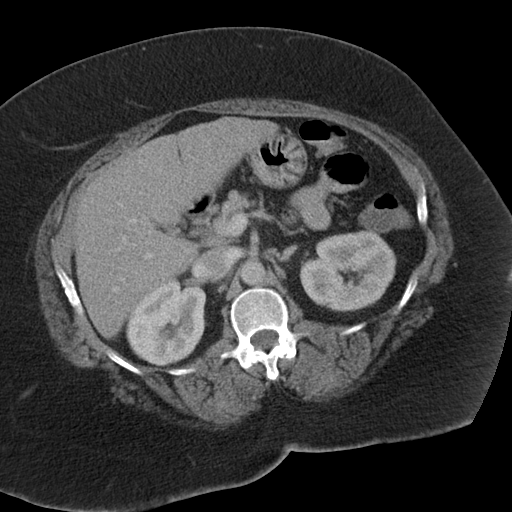
[im 67/85  soft-tissue]
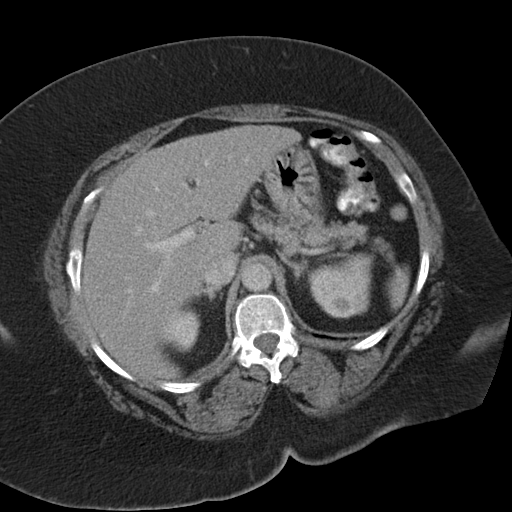
[im 67/85  lung]
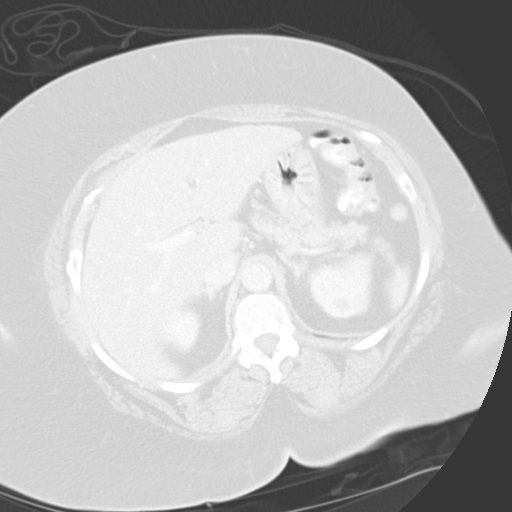
[im 71/85  soft-tissue]
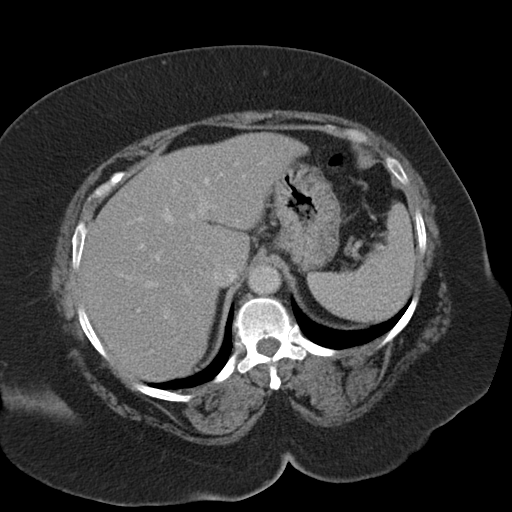
[im 71/85  lung]
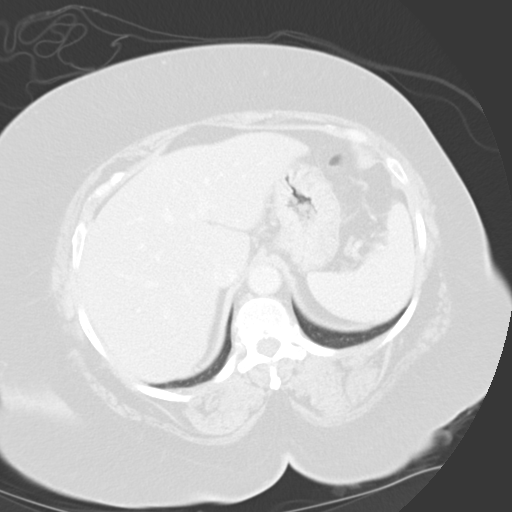
[im 76/85  lung]
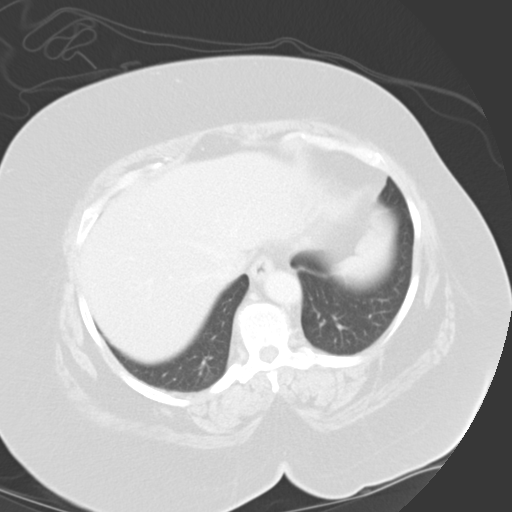
[im 80/85  soft-tissue]
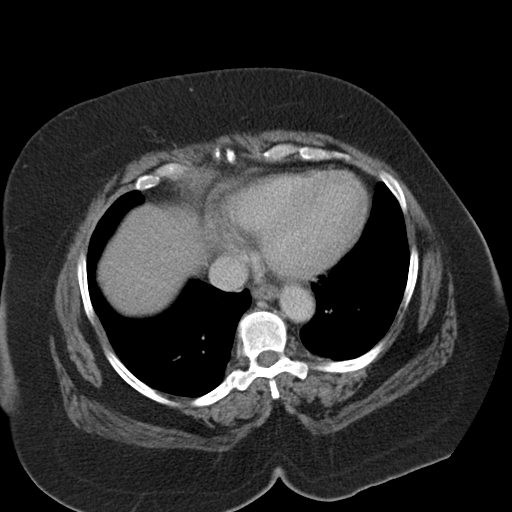
[im 80/85  lung]
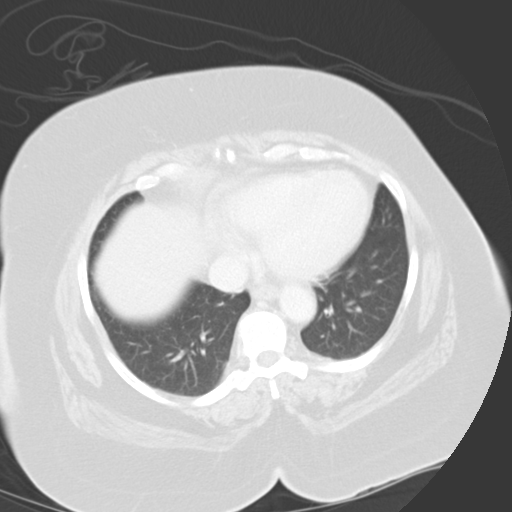

[14 of 32 positions shown; findings below may reference images not displayed]

FINDINGS: Lung bases are clear.

Mildly nodular hepatic contour.

Spleen, pancreas, and adrenal glands within normal limits.

Gallbladder is unremarkable.  No intrahepatic or extrahepatic
ductal dilatation.

Stable parenchymal calcification in the posterior interpolar left
kidney (series 2/image 26).  Right kidney is within normal limits.
No hydronephrosis.

No evidence of bowel obstruction.  Sigmoid diverticulosis with
pericolonic inflammatory changes / stranding (series 2/image 59),
likely reflecting mild sigmoid diverticulitis.  Possible secondary
wall thickening involving the distal sigmoid/rectum (series 2/image
62).

No drainable fluid collection or abscess.  No free air.

No evidence of abdominal aortic aneurysm.

Small mesenteric lymph nodes measuring up to mm short axis (series
2/image 41), likely reactive.

Uterus and bilateral ovaries are unremarkable.

Bladder is within normal limits.

Degenerative changes of the visualized thoracolumbar spine, most
prominent at L4-5.
IMPRESSION: Sigmoid diverticulitis.  No drainable fluid collection or abscess.
No free air.

Possible secondary wall thickening involving the distal
sigmoid/rectum.  Follow-up colonoscopy is suggested.

## 2012-07-14 MED ORDER — POTASSIUM CHLORIDE CRYS ER 20 MEQ PO TBCR
40.0000 meq | EXTENDED_RELEASE_TABLET | Freq: Once | ORAL | Status: AC
  Administered 2012-07-14: 40 meq via ORAL
  Filled 2012-07-14: qty 2

## 2012-07-14 MED ORDER — PROMETHAZINE HCL 25 MG PO TABS: 25.0000 mg | ORAL_TABLET | Freq: Four times a day (QID) | ORAL | Status: DC | PRN

## 2012-07-14 MED ORDER — METRONIDAZOLE 500 MG PO TABS: 500.0000 mg | ORAL_TABLET | Freq: Two times a day (BID) | ORAL | Status: DC

## 2012-07-14 MED ORDER — CIPROFLOXACIN HCL 500 MG PO TABS: 500.0000 mg | ORAL_TABLET | Freq: Two times a day (BID) | ORAL | Status: DC

## 2012-07-14 MED ORDER — IOHEXOL 300 MG/ML  SOLN
100.0000 mL | Freq: Once | INTRAMUSCULAR | Status: AC | PRN
  Administered 2012-07-14: 100 mL via INTRAVENOUS

## 2012-07-14 MED ORDER — OXYCODONE-ACETAMINOPHEN 5-325 MG PO TABS: 2.0000 | ORAL_TABLET | ORAL | Status: DC | PRN

## 2012-07-14 NOTE — ED Notes (Signed)
Pt c/o of lower abdominal pain that started yesterday around 1900. States she felt as if the pain was shooting from stomach to rectum. Pt has a history of diverticulitis 10 years ago and states this feels just like that. Last bowel movement was Wednesday per patient and was not her normal. Denies n/v, diarrhea.

## 2012-07-14 NOTE — ED Provider Notes (Signed)
History     CSN: [id number]  Arrival date & time 07/14/12  1533   First MD Initiated Contact with Patient 07/14/12 1622      Chief Complaint  Patient presents with  . Abdominal Pain    (Consider location/radiation/quality/duration/timing/severity/associated sxs/prior treatment) HPI Comments: Patient presents with a two-day history of worsening pain to the left side of her abdomen. She states it started yesterday and her lower abdomen is radiating to her upper abdomen. She's had some nausea but no vomiting. Her last bowel movement was 2 days ago and was normal. She denies any blood in her stool or diarrhea. She denies any UTI symptoms. She denies any fevers or chills. She does say that she had a history of diverticulitis about 10 years ago which felt similar.  Patient is a 56 y.o. female presenting with abdominal pain. The history is provided by the patient.  Abdominal Pain The primary symptoms of the illness include abdominal pain and nausea. The primary symptoms of the illness do not include fever, fatigue, shortness of breath, vomiting or diarrhea.  Symptoms associated with the illness do not include chills, diaphoresis, hematuria, frequency or back pain.    Past Medical History  Diagnosis Date  . Diverticulitis     Past Surgical History  Procedure Date  . Tubal ligation     No family history on file.  History  Substance Use Topics  . Smoking status: Never Smoker   . Smokeless tobacco: Not on file  . Alcohol Use: No    OB History    Grav Para Term Preterm Abortions TAB SAB Ect Mult Living                  Review of Systems  Constitutional: Negative for fever, chills, diaphoresis and fatigue.  HENT: Negative for congestion, rhinorrhea and sneezing.   Eyes: Negative.   Respiratory: Negative for cough, chest tightness and shortness of breath.   Cardiovascular: Negative for chest pain and leg swelling.  Gastrointestinal: Positive for nausea and abdominal pain.  Negative for vomiting, diarrhea and blood in stool.  Genitourinary: Negative for frequency, hematuria, flank pain and difficulty urinating.  Musculoskeletal: Negative for back pain and arthralgias.  Skin: Negative for rash.  Neurological: Negative for dizziness, speech difficulty, weakness, numbness and headaches.    Allergies  Review of patient's allergies indicates no known allergies.  Home Medications   Current Outpatient Rx  Name  Route  Sig  Dispense  Refill  . VITAMIN B50 COMPLEX PO TBCR   Oral   Take 1 capsule by mouth daily.         Marland Kitchen BIOTIN PO   Oral   Take 1 tablet by mouth daily.         Marland Kitchen CIPROFLOXACIN HCL 500 MG PO TABS   Oral   Take 1 tablet (500 mg total) by mouth every 12 (twelve) hours.   20 tablet   0   . METRONIDAZOLE 500 MG PO TABS   Oral   Take 1 tablet (500 mg total) by mouth 2 (two) times daily.   14 tablet   0   . OXYCODONE-ACETAMINOPHEN 5-325 MG PO TABS   Oral   Take 2 tablets by mouth every 4 (four) hours as needed for pain.   6 tablet   0   . PROMETHAZINE HCL 25 MG PO TABS   Oral   Take 1 tablet (25 mg total) by mouth every 6 (six) hours as needed for nausea.  30 tablet   0     BP 152/84  Pulse 106  Temp 98.1 F (36.7 C) (Oral)  Resp 18  SpO2 91%  Physical Exam  Constitutional: She is oriented to person, place, and time. She appears well-developed and well-nourished.  HENT:  Head: Normocephalic and atraumatic.  Eyes: Pupils are equal, round, and reactive to light.  Neck: Normal range of motion. Neck supple.  Cardiovascular: Normal rate, regular rhythm and normal heart sounds.   Pulmonary/Chest: Effort normal and breath sounds normal. No respiratory distress. She has no wheezes. She has no rales. She exhibits no tenderness.  Abdominal: Soft. Bowel sounds are normal. There is tenderness (Moderate tenderness to the left lower and mid abdomen. No peritoneal signs.). There is no rebound and no guarding.  Musculoskeletal:  Normal range of motion. She exhibits no edema.  Lymphadenopathy:    She has no cervical adenopathy.  Neurological: She is alert and oriented to person, place, and time.  Skin: Skin is warm and dry. No rash noted.  Psychiatric: She has a normal mood and affect.    ED Course  Procedures (including critical care time)  Results for orders placed during the hospital encounter of 07/14/12  CBC WITH DIFFERENTIAL      Component Value Range   WBC 7.7  4.0 - 10.5 K/uL   RBC 4.08  3.87 - 5.11 MIL/uL   Hemoglobin 12.5  12.0 - 15.0 g/dL   HCT 36.0  36.0 - 46.0 %   MCV 88.2  78.0 - 100.0 fL   MCH 30.6  26.0 - 34.0 pg   MCHC 34.7  30.0 - 36.0 g/dL   RDW 13.4  11.5 - 15.5 %   Platelets 173  150 - 400 K/uL   Neutrophils Relative 58  43 - 77 %   Neutro Abs 4.5  1.7 - 7.7 K/uL   Lymphocytes Relative 26  12 - 46 %   Lymphs Abs 2.0  0.7 - 4.0 K/uL   Monocytes Relative 15 (*) 3 - 12 %   Monocytes Absolute 1.2 (*) 0.1 - 1.0 K/uL   Eosinophils Relative 1  0 - 5 %   Eosinophils Absolute 0.1  0.0 - 0.7 K/uL   Basophils Relative 0  0 - 1 %   Basophils Absolute 0.0  0.0 - 0.1 K/uL  COMPREHENSIVE METABOLIC PANEL      Component Value Range   Sodium 135  135 - 145 mEq/L   Potassium 2.9 (*) 3.5 - 5.1 mEq/L   Chloride 91 (*) 96 - 112 mEq/L   CO2 32  19 - 32 mEq/L   Glucose, Bld 122 (*) 70 - 99 mg/dL   BUN 13  6 - 23 mg/dL   Creatinine, Ser 0.72  0.50 - 1.10 mg/dL   Calcium 9.5  8.4 - 10.5 mg/dL   Total Protein 7.9  6.0 - 8.3 g/dL   Albumin 3.9  3.5 - 5.2 g/dL   AST 109 (*) 0 - 37 U/L   ALT 133 (*) 0 - 35 U/L   Alkaline Phosphatase 45  39 - 117 U/L   Total Bilirubin 0.7  0.3 - 1.2 mg/dL   GFR calc non Af Amer >90  >90 mL/min   GFR calc Af Amer >90  >90 mL/min  LIPASE, BLOOD      Component Value Range   Lipase 32  11 - 59 U/L  URINALYSIS, ROUTINE W REFLEX MICROSCOPIC      Component Value Range  Color, Urine AMBER (*) YELLOW   APPearance CLOUDY (*) CLEAR   Specific Gravity, Urine 1.027  1.005  - 1.030   pH 6.0  5.0 - 8.0   Glucose, UA NEGATIVE  NEGATIVE mg/dL   Hgb urine dipstick NEGATIVE  NEGATIVE   Bilirubin Urine MODERATE (*) NEGATIVE   Ketones, ur 15 (*) NEGATIVE mg/dL   Protein, ur NEGATIVE  NEGATIVE mg/dL   Urobilinogen, UA 1.0  0.0 - 1.0 mg/dL   Nitrite NEGATIVE  NEGATIVE   Leukocytes, UA TRACE (*) NEGATIVE  URINE MICROSCOPIC-ADD ON      Component Value Range   Bacteria, UA MANY (*) RARE   Urine-Other MUCOUS PRESENT     Ct Abdomen Pelvis W Contrast  07/14/2012  *RADIOLOGY REPORT*  Clinical Data: Left abdominal pain, history of diverticulitis  CT ABDOMEN AND PELVIS WITH CONTRAST  Technique:  Multidetector CT imaging of the abdomen and pelvis was performed following the standard protocol during bolus administration of intravenous contrast.  Contrast: 19mL OMNIPAQUE IOHEXOL 300 MG/ML  SOLN  Comparison: 06/14/2005  Findings: Lung bases are clear.  Mildly nodular hepatic contour.  Spleen, pancreas, and adrenal glands within normal limits.  Gallbladder is unremarkable.  No intrahepatic or extrahepatic ductal dilatation.  Stable parenchymal calcification in the posterior interpolar left kidney (series 2/image 26).  Right kidney is within normal limits. No hydronephrosis.  No evidence of bowel obstruction.  Sigmoid diverticulosis with pericolonic inflammatory changes / stranding (series 2/image 59), likely reflecting mild sigmoid diverticulitis.  Possible secondary wall thickening involving the distal sigmoid/rectum (series 2/image 62).  No drainable fluid collection or abscess.  No free air.  No evidence of abdominal aortic aneurysm.  Small mesenteric lymph nodes measuring up to mm short axis (series 2/image 41), likely reactive.  Uterus and bilateral ovaries are unremarkable.  Bladder is within normal limits.  Degenerative changes of the visualized thoracolumbar spine, most prominent at L4-5.  IMPRESSION: Sigmoid diverticulitis.  No drainable fluid collection or abscess. No free air.   Possible secondary wall thickening involving the distal sigmoid/rectum.  Follow-up colonoscopy is suggested.   Original Report Authenticated By: Julian Hy, M.D.       1. Diverticulitis       MDM  Pt with diverticulitis.  No evidence of an abscess. Patient is well-appearing and afebrile. We'll discharge her with prescription for Percocet, Phenergan, Cipro, and Flagyl. Advised to return if her symptoms worsen. I also advised her she is to follow up with a gastroenterologist or a possible colonoscopy given that she had some wall thickening of the distal sigmoid and rectum. Patient is understanding of this. I give her referral to [facility].        Malvin Johns, MD 07/14/12 [phone]

## 2012-07-14 NOTE — ED Notes (Signed)
[id number] Expected date:<BR> Expected time:<BR> Means of arrival:<BR> Comments:<BR> Hold for [place]

## 2013-10-30 ENCOUNTER — Encounter (HOSPITAL_COMMUNITY): Payer: Self-pay | Admitting: Emergency Medicine

## 2013-10-30 ENCOUNTER — Emergency Department (HOSPITAL_COMMUNITY)
Admission: EM | Admit: 2013-10-30 | Discharge: 2013-10-31 | Disposition: A | Discharge status: Other Acute Inpatient Hospital | Payer: Federal, State, Local not specified - Other | Attending: Emergency Medicine | Admitting: Emergency Medicine

## 2013-10-30 DIAGNOSIS — R000 Tachycardia, unspecified: Secondary | ICD-10-CM | POA: Insufficient documentation

## 2013-10-30 DIAGNOSIS — F1010 Alcohol abuse, uncomplicated: Secondary | ICD-10-CM | POA: Diagnosis present

## 2013-10-30 DIAGNOSIS — F10939 Alcohol use, unspecified with withdrawal, unspecified: Secondary | ICD-10-CM | POA: Diagnosis present

## 2013-10-30 DIAGNOSIS — F329 Major depressive disorder, single episode, unspecified: Secondary | ICD-10-CM | POA: Insufficient documentation

## 2013-10-30 DIAGNOSIS — F10239 Alcohol dependence with withdrawal, unspecified: Secondary | ICD-10-CM

## 2013-10-30 DIAGNOSIS — Z8719 Personal history of other diseases of the digestive system: Secondary | ICD-10-CM | POA: Insufficient documentation

## 2013-10-30 DIAGNOSIS — F3289 Other specified depressive episodes: Secondary | ICD-10-CM | POA: Insufficient documentation

## 2013-10-30 LAB — COMPREHENSIVE METABOLIC PANEL
ALT: 71 U/L — ABNORMAL HIGH (ref 0–35)
AST: 82 U/L — ABNORMAL HIGH (ref 0–37)
Albumin: 4.5 g/dL (ref 3.5–5.2)
Alkaline Phosphatase: 49 U/L (ref 39–117)
BUN: 10 mg/dL (ref 6–23)
CO2: 16 mEq/L — ABNORMAL LOW (ref 19–32)
Calcium: 8.8 mg/dL (ref 8.4–10.5)
Chloride: 95 mEq/L — ABNORMAL LOW (ref 96–112)
Creatinine, Ser: 0.52 mg/dL (ref 0.50–1.10)
GFR calc Af Amer: >90 mL/min (ref >90)
GFR calc non Af Amer: >90 mL/min (ref >90)
Glucose, Bld: 95 mg/dL (ref 70–99)
Potassium: 3.9 mEq/L (ref 3.7–5.3)
Sodium: 143 mEq/L (ref 137–147)
Total Bilirubin: 1 mg/dL (ref 0.3–1.2)
Total Protein: 8.8 g/dL — ABNORMAL HIGH (ref 6.0–8.3)

## 2013-10-30 LAB — SALICYLATE LEVEL: Salicylate Lvl: <2 mg/dL — ABNORMAL LOW (ref 2.8–20.0)

## 2013-10-30 LAB — CBC
HCT: 39.9 % (ref 36.0–46.0)
Hemoglobin: 13.5 g/dL (ref 12.0–15.0)
MCH: 32 pg (ref 26.0–34.0)
MCHC: 33.8 g/dL (ref 30.0–36.0)
MCV: 94.5 fL (ref 78.0–100.0)
Platelets: 240 10*3/uL (ref 150–400)
RBC: 4.22 MIL/uL (ref 3.87–5.11)
RDW: 13.7 % (ref 11.5–15.5)
WBC: 5.5 10*3/uL (ref 4.0–10.5)

## 2013-10-30 LAB — ACETAMINOPHEN LEVEL: Acetaminophen (Tylenol), Serum: <15 ug/mL (ref 10–30)

## 2013-10-30 LAB — ETHANOL: Alcohol, Ethyl (B): 326 mg/dL — ABNORMAL HIGH (ref 0–11)

## 2013-10-30 MED ORDER — VITAMIN B-1 100 MG PO TABS
100.0000 mg | ORAL_TABLET | Freq: Every day | ORAL | Status: DC
  Administered 2013-10-30: 100 mg via ORAL
  Filled 2013-10-30: qty 1

## 2013-10-30 MED ORDER — LORAZEPAM 1 MG PO TABS
0.0000 mg | ORAL_TABLET | Freq: Four times a day (QID) | ORAL | Status: DC
  Administered 2013-10-30 – 2013-10-31 (×3): 2 mg via ORAL
  Administered 2013-10-31: 1 mg via ORAL
  Administered 2013-10-31: 3 mg via ORAL
  Filled 2013-10-30: qty 1
  Filled 2013-10-30: qty 2
  Filled 2013-10-30: qty 3
  Filled 2013-10-30 (×2): qty 2

## 2013-10-30 MED ORDER — SODIUM CHLORIDE 0.9 % IV BOLUS (SEPSIS)
2000.0000 mL | Freq: Once | INTRAVENOUS | Status: DC
Start: 2013-10-31 — End: 2013-10-31

## 2013-10-30 MED ORDER — ONDANSETRON 4 MG PO TBDP
4.0000 mg | ORAL_TABLET | Freq: Three times a day (TID) | ORAL | Status: DC | PRN
  Administered 2013-10-30: 4 mg via ORAL
  Filled 2013-10-30 (×2): qty 1

## 2013-10-30 MED ORDER — ONDANSETRON 8 MG PO TBDP
8.0000 mg | ORAL_TABLET | Freq: Once | ORAL | Status: AC
  Administered 2013-10-30: 8 mg via ORAL
  Filled 2013-10-30: qty 1

## 2013-10-30 MED ORDER — LORAZEPAM 1 MG PO TABS: 0.0000 mg | ORAL_TABLET | Freq: Two times a day (BID) | ORAL | Status: DC

## 2013-10-30 MED ORDER — THIAMINE HCL 100 MG/ML IJ SOLN: 100.0000 mg | Freq: Every day | INTRAMUSCULAR | Status: DC

## 2013-10-30 NOTE — ED Provider Notes (Signed)
CSN: [id number]     Arrival date & time 10/30/13  1732 History   First MD Initiated Contact with Patient 10/30/13 1812     Chief Complaint  Patient presents with  . Medical Clearance     (Consider location/radiation/quality/duration/timing/severity/associated sxs/prior Treatment) HPI Comments: 58 year old female presents voluntarily wanting detox. She states for the past month she's been drinking approximately a half gallon of vodka per day. She states her last drink was approximately 10 hours ago at 8 AM today. She drank about a fifth of vodka. She states that she's been drinking so much because her husband she is on her having marital issues. Her children told her she had to seek help or they would "leave her". Patient denies any suicidal or homicidal ideations. She states she's been through withdrawal once about 5 or 6 years ago but his are up she had seizures. Denies seizures recently. She has never been through detox..   Past Medical History  Diagnosis Date  . Diverticulitis    Past Surgical History  Procedure Laterality Date  . Tubal ligation     History reviewed. No pertinent family history. History  Substance Use Topics  . Smoking status: Never Smoker   . Smokeless tobacco: Not on file  . Alcohol Use: No   OB History   Grav Para Term Preterm Abortions TAB SAB Ect Mult Living                 Review of Systems  Gastrointestinal: Negative for vomiting.  Neurological: Negative for weakness and headaches.  Psychiatric/Behavioral: Negative for suicidal ideas and self-injury.  All other systems reviewed and are negative.      Allergies  Review of patient's allergies indicates no known allergies.  Home Medications   Current Outpatient Rx  Name  Route  Sig  Dispense  Refill  . B Complex-Biotin-FA (VITAMIN B50 COMPLEX) TBCR   Oral   Take 1 capsule by mouth daily.         Marland Kitchen BIOTIN PO   Oral   Take 1 tablet by mouth daily.          BP 177/81  Pulse 107   Temp(Src) 97.8 F (36.6 C) (Oral)  Resp 16  SpO2 96% Physical Exam  Nursing note and vitals reviewed. Constitutional: She is oriented to person, place, and time. She appears well-developed and well-nourished.  HENT:  Head: Normocephalic and atraumatic.  Right Ear: External ear normal.  Left Ear: External ear normal.  Nose: Nose normal.  Eyes: Right eye exhibits no discharge. Left eye exhibits no discharge.  Cardiovascular: Regular rhythm and normal heart sounds.  Tachycardia present.   Pulmonary/Chest: Effort normal and breath sounds normal.  Abdominal: Soft. She exhibits no distension. There is no tenderness.  Neurological: She is alert and oriented to person, place, and time.  Skin: Skin is warm and dry.  Psychiatric: She exhibits a depressed mood. She expresses no homicidal and no suicidal ideation.  Tearful    ED Course  Procedures (including critical care time) Labs Review Labs Reviewed  COMPREHENSIVE METABOLIC PANEL - Abnormal; Notable for the following:    Chloride 95 (*)    CO2 16 (*)    Total Protein 8.8 (*)    AST 82 (*)    ALT 71 (*)    All other components within normal limits  ETHANOL - Abnormal; Notable for the following:    Alcohol, Ethyl (B) 326 (*)    All other components within normal limits  SALICYLATE  LEVEL - Abnormal; Notable for the following:    Salicylate Lvl 123456 (*)    All other components within normal limits  ACETAMINOPHEN LEVEL  CBC  URINE RAPID DRUG SCREEN (HOSP PERFORMED)   Imaging Review No results found.   EKG Interpretation None      MDM   Final diagnoses:  Alcohol abuse    No SI/HI. Patient is well appearing, mild tachycardia. Labs came back showing bicarb of 16, no other significant abnormalities besides ETOH level of 326. No evidence of DKA. Will hydrate and recheck BMP. If improving, will be medically cleared and able to go to [facility] for detox. If not may need more workup.    Ephraim Hamburger, MD 10/31/13 [phone]

## 2013-10-30 NOTE — ED Notes (Addendum)
Pt states husband cheated on her a month ago, since then has drinking etoh. Pt reports drinking "six half gallons over 4 weeks." Pt states she has had issues with alcoholism in past. Pt states last drink was a "5th" at 0800. Pt states she has had emesis since this am. No diarrhea. Denies pain. Pt visibly shaking was able to ambulate room. No hx of seizures during detox.

## 2013-10-30 NOTE — ED Notes (Signed)
Patient arrived to the Unit with hospital staff.  Gait steady.  Pt quickly fell asleep after arrival.  Upon waking for assessment, patient was calm and cooperative.  She denies withdrawal symptoms from alcohol use.  Denies nausea, tremors.  She is oriented x3.  Denies SI/HI, AH/VH.  Patient has flat affect.  She is pleasant and answers questions appropriately. Will continue to monitor.

## 2013-10-30 NOTE — ED Notes (Signed)
Patient has on blue scrubs and yellow socks.  She has been wanded by security.  Patient has two personal belonging bags
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
## 2013-10-30 NOTE — ED Notes (Signed)
Pt unable to give urine sample at this time 

## 2013-10-30 NOTE — ED Notes (Signed)
Bed: [facility] Expected date:  Expected time:  Means of arrival:  Comments: [place]

## 2013-10-31 ENCOUNTER — Inpatient Hospital Stay (HOSPITAL_COMMUNITY)
Admission: AD | Admit: 2013-10-31 | Discharge: 2013-11-05 | DRG: 897 | Disposition: A | Discharge status: Home / Self Care | Payer: Federal, State, Local not specified - Other | Source: Intra-hospital | Attending: Psychiatry | Admitting: Psychiatry

## 2013-10-31 ENCOUNTER — Encounter (HOSPITAL_COMMUNITY): Payer: Self-pay | Admitting: *Deleted

## 2013-10-31 DIAGNOSIS — Z23 Encounter for immunization: Secondary | ICD-10-CM

## 2013-10-31 DIAGNOSIS — F10939 Alcohol use, unspecified with withdrawal, unspecified: Secondary | ICD-10-CM

## 2013-10-31 DIAGNOSIS — F10239 Alcohol dependence with withdrawal, unspecified: Secondary | ICD-10-CM

## 2013-10-31 DIAGNOSIS — F1094 Alcohol use, unspecified with alcohol-induced mood disorder: Secondary | ICD-10-CM | POA: Diagnosis present

## 2013-10-31 DIAGNOSIS — F1020 Alcohol dependence, uncomplicated: Principal | ICD-10-CM | POA: Diagnosis present

## 2013-10-31 DIAGNOSIS — F1994 Other psychoactive substance use, unspecified with psychoactive substance-induced mood disorder: Secondary | ICD-10-CM | POA: Diagnosis present

## 2013-10-31 DIAGNOSIS — F1010 Alcohol abuse, uncomplicated: Secondary | ICD-10-CM | POA: Diagnosis present

## 2013-10-31 DIAGNOSIS — F321 Major depressive disorder, single episode, moderate: Secondary | ICD-10-CM | POA: Diagnosis present

## 2013-10-31 DIAGNOSIS — G4700 Insomnia, unspecified: Secondary | ICD-10-CM | POA: Diagnosis present

## 2013-10-31 LAB — RAPID URINE DRUG SCREEN, HOSP PERFORMED
Amphetamines: NOT DETECTED
Barbiturates: NOT DETECTED
Benzodiazepines: NOT DETECTED
Cocaine: NOT DETECTED
Opiates: NOT DETECTED
Tetrahydrocannabinol: POSITIVE — AB

## 2013-10-31 LAB — BASIC METABOLIC PANEL
BUN: 9 mg/dL (ref 6–23)
CO2: 25 mEq/L (ref 19–32)
Calcium: 8 mg/dL — ABNORMAL LOW (ref 8.4–10.5)
Chloride: 98 mEq/L (ref 96–112)
Creatinine, Ser: 0.52 mg/dL (ref 0.50–1.10)
GFR calc Af Amer: >90 mL/min (ref >90)
GFR calc non Af Amer: >90 mL/min (ref >90)
Glucose, Bld: 100 mg/dL — ABNORMAL HIGH (ref 70–99)
Potassium: 3.5 mEq/L — ABNORMAL LOW (ref 3.7–5.3)
Sodium: 143 mEq/L (ref 137–147)

## 2013-10-31 MED ORDER — ONDANSETRON 4 MG PO TBDP
4.0000 mg | ORAL_TABLET | Freq: Once | ORAL | Status: AC
  Administered 2013-10-31: 4 mg via ORAL

## 2013-10-31 MED ORDER — HYDROCHLOROTHIAZIDE 12.5 MG PO CAPS: 12.5000 mg | ORAL_CAPSULE | Freq: Every day | ORAL | Status: DC

## 2013-10-31 MED ORDER — HYDROCHLOROTHIAZIDE 12.5 MG PO CAPS
12.5000 mg | ORAL_CAPSULE | Freq: Once | ORAL | Status: AC
  Administered 2013-10-31: 12.5 mg via ORAL
  Filled 2013-10-31: qty 1

## 2013-10-31 NOTE — ED Notes (Signed)
Patient denies complaint at present. States she is having no symptoms of detox at present. "Just feeling tired". Reports no symptoms of high blood pressure. States that she was taken off of medication for blood pressure a few years ago after a panic attack. [place] made aware.  Encouragement offered.   Patient safety maintained, Q 15 checks continue.

## 2013-10-31 NOTE — ED Notes (Signed)
EDP notified of abnormal BP.  [facility] AC notified.

## 2013-10-31 NOTE — ED Notes (Signed)
Patient experiencing nausea,vomiting an diarrhea with incontinence.  Report to [facility] charge RN delay in transfer.  Gatorade on bedside table with crackers.  Encouraged sips.  Ativan protocol continued.

## 2013-10-31 NOTE — Consult Note (Signed)
Face to face evaluation and I agree with this note 

## 2013-10-31 NOTE — ED Provider Notes (Signed)
Called to patient's bedside because patient is having asymptomatic HTN. She currently denies HA, chest pain, dyspnea, weakness or visual changes. Likely related to her history of HTN (not been on meds for years) versus going through detox. Will start on HCTZ, but at this time as she is otherwise asymptomatic and not significantly withdrawing I feel she is stable for transfer.   Ephraim Hamburger, MD 10/31/13 [phone]

## 2013-10-31 NOTE — Progress Notes (Signed)
[facility] CL provided pt with a [employer], highlighting [facility] [place], to help patient establish primary care.

## 2013-10-31 NOTE — BHH Counselor (Signed)
Per Luann RN, pt is vomiting and tremulous and not able to be transported at this time. Luann will call [facility] to transport pt to [facility] when pt is feeling better.   Arnold Long, [place] Assessment Counselor

## 2013-10-31 NOTE — Consult Note (Signed)
  Patient states that she was drinking a gallon of liquor a day.  "My son said that I can't see my grandchildren anymore until I get help.  Patient stats that she wants alcohol detox.  Patient denies suicidal/homicidal ideation, psychosis, and paranoia  In agreement with previous TTS assessment for inpatient detox.  Patient has been accepted to [facility] 307/02.  Will continue with the Ativan protocol for detox related to up and down AST/ALT.  Continue to monitor for safety and stabilization until transferred to [facility].  Shuvon B. Rankin FNP-BC

## 2013-10-31 NOTE — ED Notes (Signed)
Informed [facility] AC of patient condition.  Instructed to medicate at 1700 and reassess for transfer at 1800. Encouraged fluids for patient and emotional support.

## 2013-10-31 NOTE — ED Notes (Signed)
Continues to severe anxiety with mild nausea r/t withdrawal.  States she know someone with 12 step community to call for sponsorship.  PRN ativan admiistered per CIWA.

## 2013-11-01 ENCOUNTER — Encounter (HOSPITAL_COMMUNITY): Payer: Self-pay | Admitting: Psychiatry

## 2013-11-01 DIAGNOSIS — F1094 Alcohol use, unspecified with alcohol-induced mood disorder: Secondary | ICD-10-CM | POA: Diagnosis present

## 2013-11-01 DIAGNOSIS — F1020 Alcohol dependence, uncomplicated: Principal | ICD-10-CM

## 2013-11-01 LAB — BASIC METABOLIC PANEL
BUN: 6 mg/dL (ref 6–23)
CO2: 30 mEq/L (ref 19–32)
Calcium: 8.4 mg/dL (ref 8.4–10.5)
Chloride: 93 mEq/L — ABNORMAL LOW (ref 96–112)
Creatinine, Ser: 0.55 mg/dL (ref 0.50–1.10)
GFR calc Af Amer: >90 mL/min (ref >90)
GFR calc non Af Amer: >90 mL/min (ref >90)
Glucose, Bld: 122 mg/dL — ABNORMAL HIGH (ref 70–99)
Potassium: 3.1 mEq/L — ABNORMAL LOW (ref 3.7–5.3)
Sodium: 138 mEq/L (ref 137–147)

## 2013-11-01 LAB — TSH: TSH: 1.302 u[IU]/mL (ref 0.350–4.500)

## 2013-11-01 MED ORDER — BENAZEPRIL HCL 10 MG PO TABS
10.0000 mg | ORAL_TABLET | Freq: Every day | ORAL | Status: DC
  Administered 2013-11-02 – 2013-11-05 (×4): 10 mg via ORAL
  Filled 2013-11-01 (×7): qty 1

## 2013-11-01 MED ORDER — ADULT MULTIVITAMIN W/MINERALS CH
1.0000 | ORAL_TABLET | Freq: Every day | ORAL | Status: DC
  Administered 2013-11-01 – 2013-11-05 (×5): 1 via ORAL
  Filled 2013-11-01 (×7): qty 1

## 2013-11-01 MED ORDER — HYDROCHLOROTHIAZIDE 25 MG PO TABS
25.0000 mg | ORAL_TABLET | Freq: Once | ORAL | Status: AC
  Administered 2013-11-01: 25 mg via ORAL
  Filled 2013-11-01 (×2): qty 1

## 2013-11-01 MED ORDER — HYDROXYZINE HCL 25 MG PO TABS
25.0000 mg | ORAL_TABLET | Freq: Four times a day (QID) | ORAL | Status: AC | PRN
  Administered 2013-11-01 – 2013-11-02 (×2): 25 mg via ORAL
  Filled 2013-11-01 (×2): qty 1

## 2013-11-01 MED ORDER — THIAMINE HCL 100 MG/ML IJ SOLN
100.0000 mg | Freq: Once | INTRAMUSCULAR | Status: AC
  Administered 2013-11-01: 100 mg via INTRAMUSCULAR
  Filled 2013-11-01: qty 2

## 2013-11-01 MED ORDER — CHLORDIAZEPOXIDE HCL 25 MG PO CAPS
25.0000 mg | ORAL_CAPSULE | Freq: Every day | ORAL | Status: AC
  Administered 2013-11-05: 25 mg via ORAL
  Filled 2013-11-01: qty 1

## 2013-11-01 MED ORDER — POTASSIUM CHLORIDE CRYS ER 20 MEQ PO TBCR
20.0000 meq | EXTENDED_RELEASE_TABLET | Freq: Two times a day (BID) | ORAL | Status: AC
  Administered 2013-11-01 – 2013-11-02 (×4): 20 meq via ORAL
  Filled 2013-11-01: qty 1
  Filled 2013-11-01: qty 2
  Filled 2013-11-01 (×3): qty 1

## 2013-11-01 MED ORDER — INFLUENZA VAC SPLIT QUAD 0.5 ML IM SUSP
0.5000 mL | INTRAMUSCULAR | Status: DC
  Filled 2013-11-01: qty 0.5

## 2013-11-01 MED ORDER — MAGNESIUM HYDROXIDE 400 MG/5ML PO SUSP: 30.0000 mL | Freq: Every day | ORAL | Status: DC | PRN

## 2013-11-01 MED ORDER — TRAZODONE HCL 50 MG PO TABS
50.0000 mg | ORAL_TABLET | Freq: Every evening | ORAL | Status: DC | PRN
  Administered 2013-11-01 – 2013-11-04 (×5): 50 mg via ORAL
  Filled 2013-11-01 (×14): qty 1

## 2013-11-01 MED ORDER — CHLORDIAZEPOXIDE HCL 25 MG PO CAPS
25.0000 mg | ORAL_CAPSULE | Freq: Four times a day (QID) | ORAL | Status: AC
  Administered 2013-11-01 – 2013-11-02 (×6): 25 mg via ORAL
  Filled 2013-11-01 (×6): qty 1

## 2013-11-01 MED ORDER — BENAZEPRIL HCL 20 MG PO TABS
20.0000 mg | ORAL_TABLET | Freq: Once | ORAL | Status: AC
  Administered 2013-11-01: 20 mg via ORAL
  Filled 2013-11-01: qty 1

## 2013-11-01 MED ORDER — PNEUMOCOCCAL VAC POLYVALENT 25 MCG/0.5ML IJ INJ
0.5000 mL | INJECTION | INTRAMUSCULAR | Status: AC
  Administered 2013-11-04: 0.5 mL via INTRAMUSCULAR

## 2013-11-01 MED ORDER — VITAMIN B-1 100 MG PO TABS
100.0000 mg | ORAL_TABLET | Freq: Every day | ORAL | Status: DC
  Administered 2013-11-02 – 2013-11-05 (×4): 100 mg via ORAL
  Filled 2013-11-01 (×6): qty 1

## 2013-11-01 MED ORDER — ACETAMINOPHEN 325 MG PO TABS
650.0000 mg | ORAL_TABLET | Freq: Four times a day (QID) | ORAL | Status: DC | PRN
Start: 2013-11-01 — End: 2013-11-05
  Administered 2013-11-02 – 2013-11-03 (×2): 650 mg via ORAL
  Filled 2013-11-01 (×2): qty 2

## 2013-11-01 MED ORDER — LOPERAMIDE HCL 2 MG PO CAPS
2.0000 mg | ORAL_CAPSULE | ORAL | Status: AC | PRN
  Administered 2013-11-01: 4 mg via ORAL
  Administered 2013-11-01: 2 mg via ORAL
  Filled 2013-11-01: qty 1
  Filled 2013-11-01: qty 2

## 2013-11-01 MED ORDER — CHLORDIAZEPOXIDE HCL 25 MG PO CAPS
25.0000 mg | ORAL_CAPSULE | Freq: Three times a day (TID) | ORAL | Status: AC
  Administered 2013-11-02 – 2013-11-03 (×3): 25 mg via ORAL
  Filled 2013-11-01 (×4): qty 1

## 2013-11-01 MED ORDER — CHLORDIAZEPOXIDE HCL 25 MG PO CAPS
25.0000 mg | ORAL_CAPSULE | Freq: Four times a day (QID) | ORAL | Status: AC | PRN
  Administered 2013-11-01 – 2013-11-02 (×2): 25 mg via ORAL
  Filled 2013-11-01 (×2): qty 1

## 2013-11-01 MED ORDER — CHLORDIAZEPOXIDE HCL 25 MG PO CAPS
25.0000 mg | ORAL_CAPSULE | ORAL | Status: AC
  Administered 2013-11-03 – 2013-11-04 (×2): 25 mg via ORAL
  Filled 2013-11-01 (×2): qty 1

## 2013-11-01 MED ORDER — ONDANSETRON 4 MG PO TBDP: 4.0000 mg | ORAL_TABLET | Freq: Four times a day (QID) | ORAL | Status: AC | PRN

## 2013-11-01 MED ORDER — ALUM & MAG HYDROXIDE-SIMETH 200-200-20 MG/5ML PO SUSP
30.0000 mL | ORAL | Status: DC | PRN
Start: 2013-11-01 — End: 2013-11-05

## 2013-11-01 MED ORDER — NYSTATIN 100000 UNIT/GM EX POWD
Freq: Two times a day (BID) | CUTANEOUS | Status: DC
  Administered 2013-11-01 – 2013-11-04 (×5): via TOPICAL
  Administered 2013-11-05: 0.25 via TOPICAL
  Filled 2013-11-01: qty 15
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
## 2013-11-01 NOTE — Progress Notes (Signed)
The focus of this group is to educate the patient on the purpose and policies of crisis stabilization and provide a format to answer questions about their admission.  The group details unit policies and expectations of patients while admitted.   Pt did not attend this group.

## 2013-11-01 NOTE — BHH Suicide Risk Assessment (Signed)
[place] INPATIENT: Family/Significant Other Suicide Prevention Education   Suicide Prevention Education:  Education Completed; No one has been identified by the patient as the family member/significant other with whom the patient will be residing, and identified as the person(s) who will aid the patient in the event of a mental health crisis (suicidal ideations/suicide attempt).   Pt did not c/o SI at admission, nor have they endorsed SI during their stay here. SPE not required. SPI pamphlet provided to pt and he was encouraged to share information with his support network, ask questions, and talk about any concerns.   The suicide prevention education provided includes the following:  Suicide risk factors  Suicide prevention and interventions  National Suicide Hotline telephone number  [facility] assessment telephone number  [facility] Emergency Assistance [place] and/or Residential Mobile Crisis Unit telephone number  Regan Lemming, [place] 11/01/2013  9:38 AM
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
## 2013-11-01 NOTE — BHH Suicide Risk Assessment (Signed)
Suicide Risk Assessment  Admission Assessment     Nursing information obtained from:  Patient Demographic factors:  Divorced or widowed;Low socioeconomic status Current Mental Status:  NA Loss Factors:  Loss of significant relationship;Decline in physical health Historical Factors:  Family history of mental illness or substance abuse Risk Reduction Factors:  Sense of responsibility to family;Living with another person, especially a relative Total Time spent with patient: 45 minutes  CLINICAL FACTORS:   Depression:   Comorbid alcohol abuse/dependence Alcohol/Substance Abuse/Dependencies  Psychiatric Specialty Exam:     Blood pressure 155/110, pulse 106, temperature 98.1 F (36.7 C), temperature source Oral, resp. rate 20, height 5\' 1"  (1.549 m), weight 138.347 kg (305 lb).Body mass index is 57.66 kg/(m^2).  General Appearance: Disheveled  Eye Sport and exercise psychologist::  Fair  Speech:  Clear and Coherent, Slow and not spontaneous  Volume:  Decreased  Mood:  Depressed  Affect:  Restricted  Thought Process:  Coherent and Goal Directed  Orientation:  Full (Time, Place, and Person)  Thought Content:  symptoms, worries, concerns  Suicidal Thoughts:  No  Homicidal Thoughts:  No  Memory:  Immediate;   Fair Recent;   Fair Remote;   Fair  Judgement:  Fair  Insight:  Present  Psychomotor Activity:  Decreased  Concentration:  Fair  Recall:  [employer] of Knowledge:NA  Language: Fair  Akathisia:  No  Handed:    AIMS (if indicated):     Assets:  Desire for Improvement Housing  Sleep:  Number of Hours: 6   Musculoskeletal: Strength & Muscle Tone: within normal limits Gait & Station: normal Patient leans: N/A  COGNITIVE FEATURES THAT CONTRIBUTE TO RISK:  Closed-mindedness Polarized thinking Thought constriction (tunnel vision)    SUICIDE RISK:   Mild:  Suicidal ideation of limited frequency, intensity, duration, and specificity.  There are no identifiable plans, no associated intent, mild  dysphoria and related symptoms, good self-control (both objective and subjective assessment), few other risk factors, and identifiable protective factors, including available and accessible social support.  PLAN OF CARE: Supportive approach/coping skills/relapse prevention                               Detox/reassess and address the comorbidities  I certify that inpatient services furnished can reasonably be expected to improve the patient's condition.  [name] A 11/01/2013, 5:00 PM

## 2013-11-01 NOTE — Progress Notes (Addendum)
Patient ID: [name], female   DOB: 1955/11/02, 58 y.o.   MRN: [id number] [facility] has been in bed most of day, has been up for medication and sat in chair in her room. She has  Requested and received prn vistaril for anxiety, that was helpful.  Self inventory: depression 6, hopelessness 5, withdrawal of tremors, denies SI thoughts. She has a body odor and she allowed me to look under her stomach and there is no redness or rash, area was moist d/t her sweating.

## 2013-11-01 NOTE — Progress Notes (Signed)
Recreation Therapy Notes  Animal-Assisted Activity/Therapy (AAA/T) Program Checklist/Progress Notes Patient Eligibility Criteria Checklist & Daily Group note for Rec Tx Intervention  Date: 03.05.2015 Time: 2:45pm Location: [address]   AAA/T Program Assumption of Risk Form signed by Patient/ or Parent Legal Guardian yes  Patient is free of allergies or sever asthma yes  Patient reports no fear of animals yes  Patient reports no history of cruelty to animals yes   Patient understands his/her participation is voluntary yes  Behavioral Response: Did not attend.     [name] [name], LRT/CTRS  [name] 11/01/2013 8:24 PM

## 2013-11-01 NOTE — Progress Notes (Signed)
Psychoeducational Group Note  Date:  11/01/2013 Time:  2100  Group Topic/Focus:  wrap up group  Participation Level: Did Not Attend  Participation Quality:  Not Applicable  Affect:  Not Applicable  Cognitive:  Not Applicable  Insight:  Not Applicable  Engagement in Group: Not Applicable  Additional Comments:  Pt remained in bed during group time.   Jacques Navy 11/01/2013, 10:22 PM

## 2013-11-01 NOTE — Tx Team (Signed)
Initial Interdisciplinary Treatment Plan  PATIENT STRENGTHS: (choose at least two) Ability for insight Communication skills General fund of knowledge Supportive family/friends  PATIENT STRESSORS: Obesity Health problems Marital or family conflict Substance abuse   PROBLEM LIST: Problem List/Patient Goals Date to be addressed Date deferred Reason deferred Estimated date of resolution  ETOH abuse      Depression      HTN                                           DISCHARGE CRITERIA:  Ability to meet basic life and health needs Medical problems require only outpatient monitoring Verbal commitment to aftercare and medication compliance Withdrawal symptoms are absent or subacute and managed without 24-hour nursing intervention  PRELIMINARY DISCHARGE PLAN: Attend PHP/IOP Attend 12-step recovery group Participate in family therapy Return to previous work or school arrangements  PATIENT/FAMIILY INVOLVEMENT: This treatment plan has been presented to and reviewed with the patient, [name], and/or family member.  The patient and family have been given the opportunity to ask questions and make suggestions.  Zoe Lan 11/01/2013, 2:56 AM

## 2013-11-01 NOTE — BHH Group Notes (Signed)
[place] LCSW Group Therapy  11/01/2013 1:15 PM   Type of Therapy:  Group Therapy  Participation Level:  Did Not Attend - pt sleeping in her room  Regan Lemming, [place] 11/01/2013 1:54 PM

## 2013-11-01 NOTE — Progress Notes (Signed)
D. Pt has been in room for much of the evening, unable to attend or participate in various activities. Pt has been experiencing diarrhea, tremors, sweating and anxiety. Pt spoke about how she had been drinking and spoke about how she went through detox sometime in the 90's but expressed how this time feels harder. Pt has been receiving fluids brought to her room as well as meals. Pt has been freely drinking fluids brought to her. Pt has received various medications for withdrawal today. Pt also showered and was able to attend to personal hygiene. A. Support and encouragement provided, medication education given. R. Pt verbalized understanding, safety maintained.

## 2013-11-02 DIAGNOSIS — F1994 Other psychoactive substance use, unspecified with psychoactive substance-induced mood disorder: Secondary | ICD-10-CM

## 2013-11-02 NOTE — Tx Team (Addendum)
Interdisciplinary Treatment Plan Update (Adult)  Date: 11/02/2013  Time Reviewed:  9:45 AM  Progress in Treatment: Attending groups: Yes Participating in groups:  Yes Taking medication as prescribed:  Yes Tolerating medication:  Yes Family/Significant othe contact made: CSW assessing Patient understands diagnosis:  Yes Discussing patient identified problems/goals with staff:  Yes Medical problems stabilized or resolved:  Yes Denies suicidal/homicidal ideation: Yes Issues/concerns per patient self-inventory:  Yes Other:  New problem(s) identified: N/A  Discharge Plan or Barriers: CSW assessing for appropriate referrals.    Reason for Continuation of Hospitalization: Anxiety Depression Medication Stabilization Detox  Comments: N/A  Estimated length of stay: 3-5 days  For review of initial/current patient goals, please see plan of care.  Attendees: Patient:     Family:     Physician:  Dr. Sabra Heck 11/02/2013 10:23 AM   Nursing:   Drake Leach, RN 11/02/2013 10:23 AM   Clinical Social Worker:  Regan Lemming, LCSW 11/02/2013 10:23 AM   Other: Lars Pinks, RN case manager 11/02/2013 10:23 AM   Other:  Maxie Better, Leslie 11/02/2013 10:23 AM   Other:  Agustina Caroli, NP 11/02/2013 10:23 AM   Other:  Norberto Sorenson, care coordination 11/02/2013 10:23 AM   Other:    Other:    Other:    Other:    Other:    Other:     Scribe for Treatment Team:   Ane Payment, 11/02/2013 , 10:23 AM

## 2013-11-02 NOTE — Progress Notes (Signed)
[name] has had a hard time today, getting acclamated to the unit as well as getting to a poistion, physically, that [name] feels more comfortable. Initially, [name] said [name] did not want to   get OOB". After this nurse spoke with her and offered encouragement and reinforced the NEED to get OOB, [name] was able to do this , has attended her groups and been complaint with her therapies.   A [name] is a high fall risk. This RN spoke with her about this and [name] agreed to wear yellow socks, amb slowly and remain aware of how [name] feels, to prevent falls while here.   R [name] completes her morning self inventory and on it [name] writes [name] denies SI within the past 24 hrs and rates her depression as "5". [name] is not sure about a DC plan at this point. POC cont and therapeutic relationship

## 2013-11-02 NOTE — BHH Group Notes (Signed)
Adult Psychoeducational Group Note  Date:  11/02/2013 Time:  10:26 PM  Group Topic/Focus:  AA Meeting  Participation Level:  Minimal  Participation Quality:  Appropriate  Affect:  Appropriate  Cognitive:  Appropriate  Insight: Appropriate  Engagement in Group:  Limited  Modes of Intervention:  Discussion and Education  Additional Comments:  [name] was limited in her engagement in group.  She spoke a few times about what she has been through and how she wants to improve herself.  Victorino Sparrow A 11/02/2013, 10:26 PM

## 2013-11-02 NOTE — BHH Group Notes (Signed)
[place] LCSW Group Therapy  11/02/2013  1:15 PM   Type of Therapy:  Group Therapy  Participation Level:  Active  Participation Quality:  Attentive, Sharing and Supportive  Affect:  Depressed and Flat  Cognitive:  Alert and Oriented  Insight:  Developing/Improving and Engaged  Engagement in Therapy:  Developing/Improving and Engaged  Modes of Intervention:  Clarification, Confrontation, Discussion, Education, Exploration, Limit-setting, Orientation, Problem-solving, Rapport Building, Art therapist, Socialization and Support  Summary of Progress/Problems: The topic for today was feelings about relapse.  Pt discussed what relapse prevention is to them and identified triggers that they are on the path to relapse.  Pt processed their feeling towards relapse and was able to relate to peers.  Pt discussed coping skills that can be used for relapse prevention.   Pt shared that her motivation to not drink again is her family, as she is at risk of losing custody of her adult disabled daughter and seeing her grand kids.  Pt states that her family has been very supportive in that they are cleaning out her house now of any alcohol and plan to move in temporarily for support.  Pt was tearful while sharing how supportive her family is.  Pt discussed planning on getting a sponsor and going to meetings to prevent relapse.  Pt actively participated and was engaged in group discussion.    Regan Lemming, LCSW 11/02/2013 3:08 PM

## 2013-11-02 NOTE — Progress Notes (Signed)
D.  Pt pleasant on approach, initially flat affect but brightened with conversation.  Requested medication for sleep, no other complaints voiced.  Interacting appropriately within milieu.  Denies SI/HI/hallucinations at this time.  A.  Support and encouragement offered, medication given as ordered  R. Pt remains safe on unit, will continue to monitor.

## 2013-11-02 NOTE — Progress Notes (Signed)
Adult Psychoeducational Group Note  Date:  11/02/2013 Time:  11:23 AM  Group Topic/Focus:  Relapse Prevention Planning:   The focus of this group is to define relapse and discuss the need for planning to combat relapse.  Participation Level:  Active  Participation Quality:  Appropriate, Attentive, Sharing and Supportive  Affect:  Appropriate and Flat  Cognitive:  Alert and Appropriate  Insight: Appropriate and Good  Engagement in Group:  Engaged  Modes of Intervention:  Discussion and Education  Additional Comments:  Pt was resistant at first, but began to open up and become engaged in conversation. Pt was able to idenitfy triggers and coping skills that have helped her in her past recovery. Pt stated keeping herself busy is something that always helps her. 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   Joneen Caraway 11/02/2013, 11:23 AM

## 2013-11-02 NOTE — BHH Group Notes (Signed)
[facility] LCSW Aftercare Discharge Planning Group Note   11/02/2013 8:45 AM  Participation Quality:  Alert, Appropriate and Oriented  Mood/Affect:  Flat and Depressed  Depression Rating:  5  Anxiety Rating:  10  Thoughts of Suicide:  Pt denies SI/HI  Will you contract for safety?   Yes  Current AVH:  Pt denies  Plan for Discharge/Comments:  Pt attended discharge planning group and actively participated in group.  CSW provided pt with today's workbook.  Pt reports having increased anxiety today and verbalized that she usually drinks to cope with her anxiety.  Pt states that her son said that pt couldn't see her grandkids until she got help with the drinking.  Pt will return to own home in [place] and has follow up scheduled with [facility] for outpatient medication management and therapy.  No further needs voiced by pt at this time.    Transportation Means: Pt reports access to transportation - son will pick pt up  Supports: No supports mentioned at this time  Regan Lemming, [place] 11/02/2013 9:50 AM

## 2013-11-02 NOTE — Progress Notes (Signed)
[facility] MD Progress Note  11/02/2013 5:51 PM [name]  MRN:  [id number] Subjective:  [name] continues to be detox. States she slept better last night still not long hours and that she was able to tolerate some food today. States that her son and his wife are going to move with her to keep and eye on her. They have already told the guy who brings the alcohol to her not to come back to the house. She states she knows what she needs to do to stay sober Diagnosis:   DSM5: Schizophrenia Disorders:  none Obsessive-Compulsive Disorders:  none Trauma-Stressor Disorders:  none Substance/Addictive Disorders:  Alcohol Related Disorder - Severe (303.90) Depressive Disorders:  Major Depressive Disorder - Moderate (296.22) Total Time spent with patient: 30 minutes  Axis I: Substance Induced Mood Disorder  ADL's:  Intact  Sleep: more hours  Appetite:  improving  Suicidal Ideation:  Plan:  denies Intent:  denies Means:  denies Homicidal Ideation:  Plan:  denies Intent:  denies Means:  denies AEB (as evidenced by):  Psychiatric Specialty Exam: Physical Exam  ROS  Blood pressure 142/109, pulse 112, temperature 97.4 F (36.3 C), temperature source Oral, resp. rate 20, height '5\' 1"'  (1.549 m), weight 138.347 kg (305 lb).Body mass index is 57.66 kg/(m^2).  General Appearance: Fairly Groomed  Engineer, water::  Fair  Speech:  Clear and Coherent  Volume:  Decreased  Mood:  Anxious and worried  Affect:  anxious, worried  Thought Process:  Coherent and Goal Directed  Orientation:  Full (Time, Place, and Person)  Thought Content:  symptoms, worries,concerns  Suicidal Thoughts:  No  Homicidal Thoughts:  No  Memory:  Immediate;   Fair Recent;   Fair Remote;   Fair  Judgement:  Fair  Insight:  Present  Psychomotor Activity:  Normal  Concentration:  Fair  Recall:  [employer] of Knowledge:NA  Language: Fair  Akathisia:  No  Handed:    AIMS (if indicated):     Assets:  Desire for  Improvement Housing Social Support Vocational/Educational  Sleep:  Number of Hours: 4.5   Musculoskeletal: Strength & Muscle Tone: within normal limits Gait & Station: normal Patient leans: N/A  Current Medications: Current Facility-Administered Medications  Medication Dose Route Frequency Provider Last Rate Last Dose  . acetaminophen (TYLENOL) tablet 650 mg  650 mg Oral Q6H PRN Laverle Hobby, PA-C   650 mg at 11/02/13 1325  . alum & mag hydroxide-simeth (MAALOX/MYLANTA) 200-200-20 MG/5ML suspension 30 mL  30 mL Oral Q4H PRN Laverle Hobby, PA-C      . benazepril (LOTENSIN) tablet 10 mg  10 mg Oral Daily Encarnacion Slates, NP   10 mg at 11/02/13 0818  . chlordiazePOXIDE (LIBRIUM) capsule 25 mg  25 mg Oral Q6H PRN Laverle Hobby, PA-C   25 mg at 11/01/13 0028  . chlordiazePOXIDE (LIBRIUM) capsule 25 mg  25 mg Oral TID Laverle Hobby, PA-C       Followed by  . [START ON 11/03/2013] chlordiazePOXIDE (LIBRIUM) capsule 25 mg  25 mg Oral BH-qamhs Spencer E Simon, PA-C       Followed by  . [START ON 11/05/2013] chlordiazePOXIDE (LIBRIUM) capsule 25 mg  25 mg Oral Daily Laverle Hobby, PA-C      . hydrOXYzine (ATARAX/VISTARIL) tablet 25 mg  25 mg Oral Q6H PRN Laverle Hobby, PA-C   25 mg at 11/01/13 0846  . influenza vac split quadrivalent PF (FLUARIX) injection 0.5 mL  0.5  mL Intramuscular Tomorrow-1000 Nicholaus Bloom, MD      . loperamide (IMODIUM) capsule 2-4 mg  2-4 mg Oral PRN Laverle Hobby, PA-C   2 mg at 11/01/13 2112  . magnesium hydroxide (MILK OF MAGNESIA) suspension 30 mL  30 mL Oral Daily PRN Laverle Hobby, PA-C      . multivitamin with minerals tablet 1 tablet  1 tablet Oral Daily Laverle Hobby, PA-C   1 tablet at 11/02/13 0818  . nystatin (MYCOSTATIN/NYSTOP) topical powder   Topical BID Encarnacion Slates, NP      . ondansetron (ZOFRAN-ODT) disintegrating tablet 4 mg  4 mg Oral Q6H PRN Laverle Hobby, PA-C      . pneumococcal 23 valent vaccine (PNU-IMMUNE) injection 0.5 mL  0.5 mL  Intramuscular Tomorrow-1000 Nicholaus Bloom, MD      . potassium chloride SA (K-DUR,KLOR-CON) CR tablet 20 mEq  20 mEq Oral BID Laverle Hobby, PA-C   20 mEq at 11/02/13 0818  . thiamine (VITAMIN B-1) tablet 100 mg  100 mg Oral Daily Laverle Hobby, PA-C   100 mg at 11/02/13 0818  . traZODone (DESYREL) tablet 50 mg  50 mg Oral QHS,MR X 1 Laverle Hobby, PA-C   50 mg at 11/01/13 2111    Lab Results:  Results for orders placed during the hospital encounter of 10/31/13 (from the past 48 hour(s))  TSH     Status: None   Collection Time    11/01/13  6:40 AM      Result Value Ref Range   TSH 1.302  0.350 - 4.500 uIU/mL   Comment: Performed at [place]     Status: Abnormal   Collection Time    11/01/13  6:40 AM      Result Value Ref Range   Sodium 138  137 - 147 mEq/L   Potassium 3.1 (*) 3.7 - 5.3 mEq/L   Chloride 93 (*) 96 - 112 mEq/L   CO2 30  19 - 32 mEq/L   Glucose, Bld 122 (*) 70 - 99 mg/dL   BUN 6  6 - 23 mg/dL   Creatinine, Ser 0.55  0.50 - 1.10 mg/dL   Calcium 8.4  8.4 - 10.5 mg/dL   GFR calc non Af Amer >90  >90 mL/min   GFR calc Af Amer >90  >90 mL/min   Comment: (NOTE)     The eGFR has been calculated using the CKD EPI equation.     This calculation has not been validated in all clinical situations.     eGFR's persistently <90 mL/min signify possible Chronic Kidney     Disease.     Performed at [facility]    Physical Findings: AIMS: Facial and Oral Movements Muscles of Facial Expression: None, normal Lips and Perioral Area: None, normal Jaw: None, normal,Extremity Movements Upper (arms, wrists, hands, fingers): None, normal Lower (legs, knees, ankles, toes): None, normal, Trunk Movements Neck, shoulders, hips: None, normal, Overall Severity Severity of abnormal movements (highest score from questions above): None, normal Incapacitation due to abnormal movements: None, normal Patient's awareness of abnormal  movements (rate only patient's report): No Awareness, Dental Status Current problems with teeth and/or dentures?: Yes (missing upper front) Does patient usually wear dentures?: No  CIWA:  CIWA-Ar Total: 1 COWS:     Treatment Plan Summary: Daily contact with patient to assess and evaluate symptoms and progress in treatment Medication management  Plan: Supportive  approach/coping skills/relapse prevention           Reasess and address the co morbidities           Pursue the detox  Medical Decision Making Problem Points:  Review of psycho-social stressors (1) Data Points:  Review of medication regiment & side effects (2) Review of new medications or change in dosage (2)  I certify that inpatient services furnished can reasonably be expected to improve the patient's condition.   Harrah A 11/02/2013, 5:51 PM

## 2013-11-03 NOTE — Progress Notes (Signed)
[facility] MD Progress Note  11/03/2013 1:25 PM [name]  MRN:  [id number] Subjective:  [name] reports doing well with detox, and notes complete cessation of her withdrawal symptoms. States she slept well last she had some vivid dreams, but better than before. She reports that her appetite has improved 100%/. Upon discharge she plans to contact her old sponsor( Willaim Bane), and plan to enroll in [place] book group. Her son and his wife are going tomove in the home with her, she states that they are going to clear her house of any residual alcohol. She currently works for [employer], she plans to continue to volunteer and they are very supportive.  She states she knows what she needs to do to stay sober, reports a ten year history of sobriety and recognized her mistakes and let circumstances get in the way.  Diagnosis:   DSM5: Schizophrenia Disorders:  none Obsessive-Compulsive Disorders:  none Trauma-Stressor Disorders:  none Substance/Addictive Disorders:  Alcohol Related Disorder - Severe (303.90) Depressive Disorders:  Major Depressive Disorder - Moderate (296.22) Total Time spent with patient: 30 minutes  Axis I: Substance Induced Mood Disorder  ADL's:  Intact  Sleep: more hours  Appetite:  improving  Suicidal Ideation:  Plan:  denies Intent:  denies Means:  denies Homicidal Ideation:  Plan:  denies Intent:  denies Means:  denies AEB (as evidenced by):  Psychiatric Specialty Exam: Physical Exam  Constitutional: She is oriented to person, place, and time. She appears well-developed and well-nourished.  HENT:  Head: Normocephalic.  Eyes: Pupils are equal, round, and reactive to light.  Neck: Normal range of motion.  Cardiovascular: Normal rate.   GI: Soft.  Neurological: She is alert and oriented to person, place, and time.  Skin: Skin is warm and dry.  Psychiatric: She has a normal mood and affect. Her behavior is normal. Judgment and thought  content normal.    ROS   Blood pressure 131/111, pulse 111, temperature 97.7 F (36.5 C), temperature source Oral, resp. rate 18, height 5\' 1"  (1.549 m), weight 138.347 kg (305 lb).Body mass index is 57.66 kg/(m^2).  General Appearance: Fairly Groomed  Engineer, water::  Fair  Speech:  Clear and Coherent  Volume:  Decreased  Mood:  Anxious and worried  Affect:  anxious, worried  Thought Process:  Coherent and Goal Directed  Orientation:  Full (Time, Place, and Person)  Thought Content:  symptoms, worries,concerns  Suicidal Thoughts:  No  Homicidal Thoughts:  No  Memory:  Immediate;   Fair Recent;   Fair Remote;   Fair  Judgement:  Fair  Insight:  Present  Psychomotor Activity:  Normal  Concentration:  Fair  Recall:  [employer] of Knowledge:NA  Language: Fair  Akathisia:  No  Handed:    AIMS (if indicated):     Assets:  Desire for Improvement Housing Social Support Vocational/Educational  Sleep:  Number of Hours: 6.75   Musculoskeletal: Strength & Muscle Tone: within normal limits Gait & Station: normal Patient leans: N/A  Current Medications: Current Facility-Administered Medications  Medication Dose Route Frequency Provider Last Rate Last Dose  . acetaminophen (TYLENOL) tablet 650 mg  650 mg Oral Q6H PRN Laverle Hobby, PA-C   650 mg at 11/03/13 0849  . alum & mag hydroxide-simeth (MAALOX/MYLANTA) 200-200-20 MG/5ML suspension 30 mL  30 mL Oral Q4H PRN Laverle Hobby, PA-C      . benazepril (LOTENSIN) tablet 10 mg  10 mg Oral Daily Encarnacion Slates,  NP   10 mg at 11/03/13 0847  . chlordiazePOXIDE (LIBRIUM) capsule 25 mg  25 mg Oral Q6H PRN Laverle Hobby, PA-C   25 mg at 11/02/13 2127  . chlordiazePOXIDE (LIBRIUM) capsule 25 mg  25 mg Oral BH-qamhs Laverle Hobby, PA-C       Followed by  . [START ON 11/05/2013] chlordiazePOXIDE (LIBRIUM) capsule 25 mg  25 mg Oral Daily Laverle Hobby, PA-C      . hydrOXYzine (ATARAX/VISTARIL) tablet 25 mg  25 mg Oral Q6H PRN Laverle Hobby, PA-C   25 mg at 11/02/13 2127  . influenza vac split quadrivalent PF (FLUARIX) injection 0.5 mL  0.5 mL Intramuscular Tomorrow-1000 Nicholaus Bloom, MD      . loperamide (IMODIUM) capsule 2-4 mg  2-4 mg Oral PRN Laverle Hobby, PA-C   2 mg at 11/01/13 2112  . magnesium hydroxide (MILK OF MAGNESIA) suspension 30 mL  30 mL Oral Daily PRN Laverle Hobby, PA-C      . multivitamin with minerals tablet 1 tablet  1 tablet Oral Daily Laverle Hobby, PA-C   1 tablet at 11/03/13 0847  . nystatin (MYCOSTATIN/NYSTOP) topical powder   Topical BID Encarnacion Slates, NP      . ondansetron (ZOFRAN-ODT) disintegrating tablet 4 mg  4 mg Oral Q6H PRN Laverle Hobby, PA-C      . pneumococcal 23 valent vaccine (PNU-IMMUNE) injection 0.5 mL  0.5 mL Intramuscular Tomorrow-1000 Nicholaus Bloom, MD      . thiamine (VITAMIN B-1) tablet 100 mg  100 mg Oral Daily Laverle Hobby, PA-C   100 mg at 11/03/13 0847  . traZODone (DESYREL) tablet 50 mg  50 mg Oral QHS,MR X 1 Laverle Hobby, PA-C   50 mg at 11/02/13 2126    Lab Results:  No results found for this or any previous visit (from the past 48 hour(s)).  Physical Findings: AIMS: Facial and Oral Movements Muscles of Facial Expression: None, normal Lips and Perioral Area: None, normal Jaw: None, normal,Extremity Movements Upper (arms, wrists, hands, fingers): None, normal Lower (legs, knees, ankles, toes): None, normal, Trunk Movements Neck, shoulders, hips: None, normal, Overall Severity Severity of abnormal movements (highest score from questions above): None, normal Incapacitation due to abnormal movements: None, normal Patient's awareness of abnormal movements (rate only patient's report): No Awareness, Dental Status Current problems with teeth and/or dentures?: Yes (missing upper front) Does patient usually wear dentures?: No  CIWA:  CIWA-Ar Total: 2 COWS:     Treatment Plan Summary: Daily contact with patient to assess and evaluate symptoms and progress  in treatment Medication management  Plan: Supportive approach/coping skills/relapse prevention           Reasess and address the co morbidities           Pursue the detox  Medical Decision Making Problem Points:  Review of psycho-social stressors (1) Data Points:  Review of medication regiment & side effects (2) Review of new medications or change in dosage (2)  I certify that inpatient services furnished can reasonably be expected to improve the patient's condition.   Priscille Loveless S 11/03/2013, 1:25 PM

## 2013-11-03 NOTE — Progress Notes (Signed)
[name] presents to the med window first thing this morning. SHe makes  Better eye contact. She says " I'm doing a little better today". She says that she " slept better" and that she needs her pitcher refilled  And that she " ate breakfast " this morning.    A She completes her morning self inventory  And denies SI within the past 24 hrs and writes her DC plan is to " not drink ETOH and changes Ppeople, places and things". She takes her medications as ordered and she is engaged in her poc and recovery as evidenced by her complaint behavior, her active participation in her groups and her verbalizing  Healthier changes she will make at [facility]   R POC in place.

## 2013-11-03 NOTE — Progress Notes (Signed)
Adult Psychoeducational Group Note  Date:  11/03/2013 Time:  4:48 PM  Group Topic/Focus:  Healthy Communication:   The focus of this group is to discuss communication, barriers to communication, as well as healthy ways to communicate with others.  Participation Level:  Active  Participation Quality:  Appropriate and Attentive  Affect:  Appropriate  Cognitive:  Appropriate  Insight: Good  Engagement in Group:  Improving  Modes of Intervention:  Activity, Discussion and [place], [place] 11/03/2013, 4:48 PM

## 2013-11-03 NOTE — BHH Group Notes (Signed)
[facility] Group Notes:  (Clinical Social Work)  11/03/2013     10-11AM  Summary of Progress/Problems:   The main focus of today's process group was for the patient to identify ways in which they have in the past sabotaged their own recovery. Motivational Interviewing and a worksheet were utilized to help patients explore in depth the perceived benefits and costs of their substance use, as well as the potential benefits and costs of stopping.  The Stages of Change were explained using a handout, with an emphasis on making plans to deal with sabotaging behaviors proactivelky.  The patient expressed that the self-sabotaging behavior she uses is stockpiling liquor, even during times she is not drinking.  Her family has completely cleared out all the alcohol while she has been in this hospital and she is very happy and stated she is not scared about that.    Type of Therapy:  Group Therapy - Process   Participation Level:  Active  Participation Quality:  Attentive and Sharing  Affect:  Blunted  Cognitive:  Appropriate  Insight:  Developing/Improving  Engagement in Therapy:  Developing/Improving  Modes of Intervention:  Education, Support and Processing, Motivational Interviewing  Selmer Dominion, LCSW 11/03/2013, 12:21 PM

## 2013-11-03 NOTE — Progress Notes (Signed)
D.  Pt pleasant on approach, no complaints voiced.  Positive for evening wrap up group, interacting appropriately within milieu.  Denies SI/HI/hallucinations at this time.  Very minimal s/s of withdrawal at this time  A.  Support and encouragement offered.  R.  Pt remains safe on unit, will continue to monitor.

## 2013-11-03 NOTE — Progress Notes (Signed)
[facility] Group Notes:  (Nursing/MHT/Case Management/Adjunct)  Date:  11/03/2013  Time:  2100 Type of Therapy:  wrap up group  Participation Level:  Active  Participation Quality:  Attentive, Sharing and Supportive  Affect:  Appropriate  Cognitive:  Appropriate  Insight:  Appropriate  Engagement in Group:  Engaged  Modes of Intervention:  Clarification, Education and Support  Summary of Progress/Problems:Pt reports discharging home with plans to get a sponsor and attend daily meetings. Pt is grateful for and motivated by her grandchildren in her life.   Jacques Navy 11/03/2013, 9:58 PM

## 2013-11-03 NOTE — Progress Notes (Signed)
 .  Psychoeducational Group Note    Date: 11/03/2013 Time:  0930  Goal Setting Purpose of Group: To be able to set a goal that is measurable and that can be accomplished in one day Participation Level:  Active  Participation Quality:  Appropriate  Affect:  Appropriate  Cognitive:  Oriented  Insight:  Improving  Engagement in Group:  Engaged  Additional Comments:  [name]

## 2013-11-04 NOTE — Progress Notes (Signed)
D.  Pt pleasant on approach, denies complaints at this time.  Positive for evening AA group, interacting appropriately within milieu.  Denies SI/HI/hallucinations at this time, hopeful for discharge tomorrow.  A.  Support and encouragement offered  R.  Pt remains safe on unit, will continue to monitor.

## 2013-11-04 NOTE — Progress Notes (Signed)
D [name] says she is feeling better this morning. SHe makes good eye contact. SHe speaks with clarity, demonstrates good thought organization and says " I'm  Doing better today. I slept better last night."   A She takes scheduled meds as ordered and is engaged in her recovery as evidenced by her attendance in her group therapies, her engagement in the group discussions and her willingness to complete her daily self inventories. Today she writes she denies SI within the past 24 hrs, she rates her depression and hopelessness " 11/ 0 " and writes her DC plan is " to get a sponsor and  Go to 90 mtgs in 90 days"  And change people and places I frequent".   R Safety is in place and poc maintained.

## 2013-11-04 NOTE — Progress Notes (Signed)
Psychoeducational Group Note  Date:  11/04/2013 Time:  1315 Group Topic/Focus:  Making Healthy Choices:   The focus of this group is to help patients identify negative/unhealthy choices they were using prior to admission and identify positive/healthier coping strategies to replace them upon discharge.  Participation Level:  Active  Participation Quality:  Appropriate  Affect:  Appropriate  Cognitive:  Oriented  Insight:  Improving  Engagement in Group:  Engaged  Additional Comments:    Paulino Rily 11/04/2013

## 2013-11-04 NOTE — Progress Notes (Signed)
Psychoeducational Group Note  Psychoeducational Group Note  Date: 11/04/2013 Time:  0900  Group Topic/Focus:  Gratefulness:  The focus of this group is to help patients identify what two things they are most grateful for in their lives. What helps ground them and to center them on their work to their recovery.  Participation Level:  Active  Participation Quality:  Appropriate  Affect:  Appropriate  Cognitive:  Oriented  Insight:  Improving  Engagement in Group:  Engaged  Additional Comments:    Paulino Rily

## 2013-11-04 NOTE — Progress Notes (Signed)
[facility] MD Progress Note  11/04/2013 12:09 PM [name]  MRN:  [id number] Subjective:  [name] reports doing well with detox. She is excited she is getting a visit from her family today. States she slept well last, denies having any vivid dreams. She reports that her appetite has improved 100%, she ate her whole breakfast. Upon discharge she plans to contact her old sponsor(Lisane Beryl Meager), and plan to enroll in [employer] group. Her son and his wife are going tomove in the home with her, she states that they are going to clear her house of any residual alcohol.  She states she knows what she needs to do to stay sober, reports a ten year history of sobriety and recognized her mistakes and let circumstances get in the way. She is also excited about her roommate getting much rest last night(Faye).  Diagnosis:   DSM5: Schizophrenia Disorders:  none Obsessive-Compulsive Disorders:  none Trauma-Stressor Disorders:  none Substance/Addictive Disorders:  Alcohol Related Disorder - Severe (303.90) Depressive Disorders:  Major Depressive Disorder - Moderate (296.22) Total Time spent with patient: 30 minutes  Axis I: Substance Induced Mood Disorder  ADL's:  Intact  Sleep: more hours  Appetite:  Good  Suicidal Ideation:  Plan:  denies Intent:  denies Means:  denies Homicidal Ideation:  Plan:  denies Intent:  denies Means:  denies AEB (as evidenced by):  Psychiatric Specialty Exam: Physical Exam  Constitutional: She is oriented to person, place, and time. She appears well-developed and well-nourished.  HENT:  Head: Normocephalic.  Eyes: Pupils are equal, round, and reactive to light.  Neck: Normal range of motion.  Cardiovascular: Normal rate.   GI: Soft.  Neurological: She is alert and oriented to person, place, and time.  Skin: Skin is warm and dry.  Psychiatric: She has a normal mood and affect. Her behavior is normal. Judgment and thought content normal.    ROS   Blood pressure  126/84, pulse 91, temperature 97.4 F (36.3 C), temperature source Oral, resp. rate 20, height 5\' 1"  (1.549 m), weight 138.347 kg (305 lb).Body mass index is 57.66 kg/(m^2).  General Appearance: Fairly Groomed  Engineer, water::  Fair  Speech:  Clear and Coherent  Volume:  Decreased  Mood:  Euthymic  Affect:  Appropriate  Thought Process:  Coherent and Goal Directed  Orientation:  Full (Time, Place, and Person)  Thought Content:  symptoms, worries,concerns  Suicidal Thoughts:  No  Homicidal Thoughts:  No  Memory:  Immediate;   Fair Recent;   Fair Remote;   Fair  Judgement:  Fair  Insight:  Present  Psychomotor Activity:  Normal  Concentration:  Fair  Recall:  [employer] of Knowledge:NA  Language: Fair  Akathisia:  No  Handed:    AIMS (if indicated):     Assets:  Desire for Improvement Housing Social Support Vocational/Educational  Sleep:  Number of Hours: 5   Musculoskeletal: Strength & Muscle Tone: within normal limits Gait & Station: normal Patient leans: N/A  Current Medications: Current Facility-Administered Medications  Medication Dose Route Frequency Provider Last Rate Last Dose  . acetaminophen (TYLENOL) tablet 650 mg  650 mg Oral Q6H PRN Laverle Hobby, PA-C   650 mg at 11/03/13 0849  . alum & mag hydroxide-simeth (MAALOX/MYLANTA) 200-200-20 MG/5ML suspension 30 mL  30 mL Oral Q4H PRN Laverle Hobby, PA-C      . benazepril (LOTENSIN) tablet 10 mg  10 mg Oral Daily Encarnacion Slates, NP   10 mg at 11/04/13  [id number]  [name] ON 11/05/2013] chlordiazePOXIDE (LIBRIUM) capsule 25 mg  25 mg Oral Daily Laverle Hobby, PA-C      . influenza vac split quadrivalent PF (FLUARIX) injection 0.5 mL  0.5 mL Intramuscular Tomorrow-1000 Nicholaus Bloom, MD      . magnesium hydroxide (MILK OF MAGNESIA) suspension 30 mL  30 mL Oral Daily PRN Laverle Hobby, PA-C      . multivitamin with minerals tablet 1 tablet  1 tablet Oral Daily Laverle Hobby, PA-C   1 tablet at 11/04/13 0756  .  nystatin (MYCOSTATIN/NYSTOP) topical powder   Topical BID Encarnacion Slates, NP      . pneumococcal 23 valent vaccine (PNU-IMMUNE) injection 0.5 mL  0.5 mL Intramuscular Tomorrow-1000 Nicholaus Bloom, MD      . thiamine (VITAMIN B-1) tablet 100 mg  100 mg Oral Daily Laverle Hobby, PA-C   100 mg at 11/04/13 0756  . traZODone (DESYREL) tablet 50 mg  50 mg Oral QHS,MR X 1 Laverle Hobby, PA-C   50 mg at 11/03/13 2136    Lab Results:  No results found for this or any previous visit (from the past 48 hour(s)).  Physical Findings: AIMS: Facial and Oral Movements Muscles of Facial Expression: None, normal Lips and Perioral Area: None, normal Jaw: None, normal,Extremity Movements Upper (arms, wrists, hands, fingers): None, normal Lower (legs, knees, ankles, toes): None, normal, Trunk Movements Neck, shoulders, hips: None, normal, Overall Severity Severity of abnormal movements (highest score from questions above): None, normal Incapacitation due to abnormal movements: None, normal Patient's awareness of abnormal movements (rate only patient's report): No Awareness, Dental Status Current problems with teeth and/or dentures?: Yes (missing upper front) Does patient usually wear dentures?: No  CIWA:  CIWA-Ar Total: 0 COWS:     Treatment Plan Summary: Daily contact with patient to assess and evaluate symptoms and progress in treatment Medication management  Plan: Supportive approach/coping skills/relapse prevention           Reasess and address the co morbidities           Pursue the detox  Medical Decision Making Problem Points:  Review of psycho-social stressors (1) Data Points:  Review of medication regiment & side effects (2) Review of new medications or change in dosage (2)  I certify that inpatient services furnished can reasonably be expected to improve the patient's condition.   Priscille Loveless S 11/04/2013, 12:09 PM

## 2013-11-04 NOTE — Progress Notes (Signed)
Late entry on 11-04-2013 for 11-03-2013   Psychoeducational Group Note  Date: 11/04/2013 Time:  1015  Group Topic/Focus:  Identifying Needs:   The focus of this group is to help patients identify their personal needs that have been historically problematic and identify healthy behaviors to address their needs.  Participation Level: Did not attend Paulino Rily

## 2013-11-04 NOTE — BHH Group Notes (Signed)
[facility] Group Notes:  (Clinical Social Work)  11/04/2013  10:00-11:00AM  Summary of Progress/Problems:   The main focus of today's process group was to identify the patient's current support system and decide on other supports that can be put in place.  The picture on workbook was used to discuss why additional supports are needed.  An emphasis was placed on using counselor, doctor, therapy groups, 12-step groups, and problem-specific support groups to expand supports.   There was also an extensive discussion about what constitutes a healthy support versus an unhealthy support.  The patient expressed full comprehension of the concepts presented, and agreed that there is a need to add more supports.  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  An unhealthy support in her life has been her boyfriend of 20 years who will bring alcohol over if she runs out of money and asks for it.  Her family has already talked to him and told him this cannot take place.  She states that if it continues, she is willing to cut him out of her life.  Type of Therapy:  Process Group with Motivational Interviewing  Participation Level:  Active  Participation Quality:  Appropriate, Attentive and Sharing  Affect:  Blunted and Depressed  Cognitive:  Appropriate and Oriented  Insight:  Engaged  Engagement in Therapy:  Engaged  Modes of Intervention:   Education, Support and Processing, Activity  [employer], LCSW 11/04/2013, 12:15pm

## 2013-11-04 NOTE — Progress Notes (Signed)
Adult Psychoeducational Group Note  Date:  11/04/2013 Time:  4:00 PM  Group Topic/Focus:  Healthy Coping Skills  Participation Level:  Active  Participation Quality:  Appropriate and Attentive  Affect:  Appropriate  Cognitive:  Appropriate  Insight: Appropriate  Engagement in Group:  Engaged  Modes of Intervention:  Activity, Discussion and Socialization   Elisha Headland 11/04/2013, 4:00 PM

## 2013-11-05 MED ORDER — BENAZEPRIL HCL 10 MG PO TABS: 10.0000 mg | ORAL_TABLET | Freq: Every day | ORAL | Status: DC

## 2013-11-05 MED ORDER — TRAZODONE HCL 50 MG PO TABS: 50.0000 mg | ORAL_TABLET | Freq: Every evening | ORAL | Status: DC | PRN

## 2013-11-05 MED ORDER — NYSTATIN 100000 UNIT/GM EX POWD: 30.0000 g | Freq: Two times a day (BID) | CUTANEOUS | Status: DC
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
## 2013-11-05 NOTE — Tx Team (Signed)
Interdisciplinary Treatment Plan Update (Adult)  Date: 11/05/2013  Time Reviewed:  10:52 AM   Progress in Treatment: Attending groups: Yes Participating in groups:  Yes Taking medication as prescribed:  Yes Tolerating medication:  Yes Family/Significant othe contact made: SPE not required/n/a Patient understands diagnosis:  Yes Discussing patient identified problems/goals with staff:  Yes Medical problems stabilized or resolved:  Yes Denies suicidal/homicidal ideation: Yes Issues/concerns per patient self-inventory:  Yes Other:  New problem(s) identified: N/A  Discharge Plan or Barriers: Pt to return home and plans to follow up at [facility] for services.   Reason for Continuation of Hospitalization: d/c today  Comments: N/A  Estimated length of stay: d/c today   For review of initial/current patient goals, please see plan of care.  Attendees: Patient:     Family:     Physician:  Dr. Sabra Heck 11/05/2013 10:52 AM   Nursing:   Butch Penny RN 11/05/2013 10:52 AM   Clinical Lelon Frohlich. RN 11/05/2013 10:52 AM   Other: Lars Pinks, RN case manager 11/05/2013 10:52 AM   Other:  Maxie Better, LCSWA 11/05/2013 10:52 AM   Other:  Agustina Caroli, NP 11/05/2013 10:52 AM   Other:    Other:    Other:    Other:    Other:    Other:    Other:     Scribe for Treatment Team:   Maxie Better, [place] 11/05/2013 10:52 AM

## 2013-11-05 NOTE — Progress Notes (Signed)
[facility] Adult Case Management Discharge Plan :  Will you be returning to the same living situation after discharge: Yes,  home At discharge, do you have transportation home?:Yes,  son Do you have the ability to pay for your medications:Yes,  mental health  Release of information consent forms completed and submitted to Medical Records by CSW. Patient to Follow up at: Follow-up Information   Follow up with [facility] On 11/06/2013. (Walk in on this date for hospital discharge appointment. Walk in clinic is Monday - Friday 8 am - 3 pm. They will than schedule you for medication management and therapy)    Contact information:   [address], [place] [address] Phone: [phone] Fax: [phone]      Patient denies SI/HI:   Yes,  during group, self report, and admission.    Safety Planning and Suicide Prevention discussed:  Yes,  SPE not required for this pt. SPI pamphlet provided to pt and she was encouraged to share information with support network, ask questions, and talk about any concerns relating to SPE.  Smart, Averlee Swartz LCSWA  11/05/2013, 10:55 AM

## 2013-11-05 NOTE — BHH Group Notes (Signed)
[facility] LCSW Aftercare Discharge Planning Group Note   11/05/2013 10:50 AM  Participation Quality:  Appropriate   Mood/Affect:  Appropriate  Depression Rating:  1  Anxiety Rating:  1  Thoughts of Suicide:  No Will you contract for safety?   NA  Current AVH:  No  Plan for Discharge/Comments:  Pt plans to return home to her family and will follow up at [facility] for med management. She reports feeling sleepy, but otherwise, feels "much better and ready to go back home."   Transportation Means: son-after lunch   Supports: family and extended family supports identified.   Smart, [employer]

## 2013-11-05 NOTE — Progress Notes (Signed)
Pt discharged per MD orders; pt currently denies SI/HI and auditory/visual hallucinations; pt was given education by RN regarding follow-up appointments and medications and pt denied any questions or concerns about these instructions; pt was then escorted to search room to retrieve her belongings by RN before being discharged to hospital lobby. 

## 2013-11-05 NOTE — BHH Group Notes (Signed)
Adult Psychoeducational Group Note  Date:  11/05/2013 Time:  11:29 AM  Group Topic/Focus:  Dimensions of Wellness:   The focus of this group is to introduce the topic of wellness and discuss the role each dimension of wellness plays in total health.  Participation Level:  Active  Participation Quality:  Appropriate  Affect:  Appropriate  Cognitive:  Appropriate  Insight: Appropriate  Engagement in Group:  Engaged  Modes of Intervention:  Discussion, Education, Exploration and Support  Additional Comments:  Pt identified different self-care activities that will be implemented after discharge. Evanne Matsunaga P 11/05/2013, 11:29 AM

## 2013-11-05 NOTE — Progress Notes (Signed)
Patient did attend the evening speaker AA meeting.  

## 2013-11-05 NOTE — [facility] Suicide Risk Assessment (Signed)
Suicide Risk Assessment  Discharge Assessment     Demographic Factors:  NA  Total Time spent with patient: 45 minutes  Psychiatric Specialty Exam:     Blood pressure 142/88, pulse 105, temperature 97.7 F (36.5 C), temperature source Oral, resp. rate 20, height 5\' 1"  (1.549 m), weight 138.347 kg (305 lb).Body mass index is 57.66 kg/(m^2).  General Appearance: Fairly Groomed  Engineer, water::  Fair  Speech:  Clear and Coherent  Volume:  Normal  Mood:  Euthymic  Affect:  Appropriate  Thought Process:  Coherent and Goal Directed  Orientation:  Full (Time, Place, and Person)  Thought Content:  her relapse prevention plan:son and D-in-law will move in with her, her friend who brings the alcohol to her will not be welcome at the house  Suicidal Thoughts:  No  Homicidal Thoughts:  No  Memory:  Immediate;   Fair Recent;   Fair Remote;   Fair  Judgement:  Fair  Insight:  Present  Psychomotor Activity:  Normal  Concentration:  Fair  Recall:  [employer] [place]  Language: Fair  Akathisia:  No  Handed:    AIMS (if indicated):     Assets:  Desire for Improvement Housing Social Support Vocational/Educational  Sleep:  Number of Hours: 5.75    Musculoskeletal: Strength & Muscle Tone: within normal limits Gait & Station: normal Patient leans: N/A   Mental Status Per Nursing Assessment::   On Admission:  NA  Current Mental Status by Physician: In full contact with reality. There are no active S/S of withdrawal. She is willing and motivated to work on long term abstinence   Loss Factors: NA  Historical Factors: NA  Risk Reduction Factors:   Sense of responsibility to family, Employed and Positive social support  Continued Clinical Symptoms:  Depression:   Comorbid alcohol abuse/dependence Alcohol/Substance Abuse/Dependencies  Cognitive Features That Contribute To Risk:  Closed-mindedness Polarized thinking Thought constriction (tunnel vision)    Suicide  Risk:  Minimal: No identifiable suicidal ideation.  Patients presenting with no risk factors but with morbid ruminations; may be classified as minimal risk based on the severity of the depressive symptoms  Discharge Diagnoses:   AXIS I:  Alcohol Dependence, Substance Induced Mood Disorder AXIS II:  Deferred AXIS III:   Past Medical History  Diagnosis Date  . Diverticulitis    AXIS IV:  other psychosocial or environmental problems AXIS V:  61-70 mild symptoms  Plan Of Care/Follow-up recommendations:  Activity:  as tolerated Diet:  regular Follow up outpatient basis Is patient on multiple antipsychotic therapies at discharge:  No   Has Patient had three or more failed trials of antipsychotic monotherapy by history:  No  Recommended Plan for Multiple Antipsychotic Therapies: NA    Greenlee Ancheta A 11/05/2013, 12:57 PM

## 2013-11-09 NOTE — Progress Notes (Signed)
Patient Discharge Instructions:  After Visit Summary (AVS):   Faxed to:  11/09/13 Discharge Summary Note:   Faxed to:  11/09/13 Psychiatric Admission Assessment Note:   Faxed to:  11/09/13 Suicide Risk Assessment - Discharge Assessment:   Faxed to:  11/09/13 Faxed/Sent to the Next Level Care provider:  11/09/13 Faxed to [facility] [place], 11/09/2013, 1:31 PM

## 2014-07-09 ENCOUNTER — Encounter (HOSPITAL_COMMUNITY): Payer: Self-pay | Admitting: *Deleted

## 2014-07-09 ENCOUNTER — Emergency Department (INDEPENDENT_AMBULATORY_CARE_PROVIDER_SITE_OTHER)
Admission: EM | Admit: 2014-07-09 | Discharge: 2014-07-09 | Disposition: A | Discharge status: Home / Self Care | Payer: Self-pay | Source: Home / Self Care | Attending: Family Medicine | Admitting: Family Medicine

## 2014-07-09 DIAGNOSIS — I10 Essential (primary) hypertension: Secondary | ICD-10-CM

## 2014-07-09 HISTORY — DX: Essential (primary) hypertension: I10

## 2014-07-09 LAB — POCT I-STAT, CHEM 8
BUN: 17 mg/dL (ref 6–23)
Calcium, Ion: 1.2 mmol/L (ref 1.12–1.23)
Chloride: 101 mEq/L (ref 96–112)
Creatinine, Ser: 1 mg/dL (ref 0.50–1.10)
Glucose, Bld: 110 mg/dL — ABNORMAL HIGH (ref 70–99)
HCT: 42 % (ref 36.0–46.0)
Hemoglobin: 14.3 g/dL (ref 12.0–15.0)
Potassium: 3.8 mEq/L (ref 3.7–5.3)
Sodium: 137 mEq/L (ref 137–147)
TCO2: 29 mmol/L (ref 0–100)

## 2014-07-09 MED ORDER — AMLODIPINE BESYLATE 10 MG PO TABS: 10.0000 mg | ORAL_TABLET | Freq: Every day | ORAL | Status: DC

## 2014-07-09 NOTE — ED Notes (Signed)
Pt  Has   A  History   Of HTN      She  Reports           She    Has    Not  Taken             Her  meds    In  Months    She  Reports  A  headche   And  Had  Some  Tingling  In  Her  l  Hand  Yesterday           At  This  Time  She  Is  Sitting  Upright on  The  Exam table  Speaking in  Complete  sentances  In no  Acute  Distress

## 2014-07-09 NOTE — ED Provider Notes (Signed)
CSN: [id number]     Arrival date & time 07/09/14  1502 History   First MD Initiated Contact with Patient 07/09/14 1522     Chief Complaint  Patient presents with  . Hypertension   (Consider location/radiation/quality/duration/timing/severity/associated sxs/prior Treatment) Patient is a 58 y.o. female presenting with hypertension. The history is provided by the patient.  Hypertension This is a chronic problem. The current episode started 6 to 12 hours ago (no bp meds for 5 mo, this am felt HA and found bp elevated.). Associated symptoms include headaches. Pertinent negatives include no chest pain and no shortness of breath.    Past Medical History  Diagnosis Date  . Diverticulitis   . Hypertension    Past Surgical History  Procedure Laterality Date  . Tubal ligation     History reviewed. No pertinent family history. History  Substance Use Topics  . Smoking status: Never Smoker   . Smokeless tobacco: Not on file  . Alcohol Use: 6.0 oz/week    10 Shots of liquor per week   OB History    No data available     Review of Systems  Constitutional: Negative.   Respiratory: Negative for chest tightness and shortness of breath.   Cardiovascular: Negative for chest pain, palpitations and leg swelling.  Neurological: Positive for numbness and headaches. Negative for dizziness, speech difficulty and weakness.    Allergies  Review of patient's allergies indicates no known allergies.  Home Medications   Prior to Admission medications   Medication Sig Start Date End Date Taking? Authorizing Provider  amLODipine (NORVASC) 10 MG tablet Take 1 tablet (10 mg total) by mouth daily. 07/09/14   Billy Fischer, MD  benazepril (LOTENSIN) 10 MG tablet Take 1 tablet (10 mg total) by mouth daily. For hypentesion 11/05/13   Encarnacion Slates, NP  nystatin (MYCOSTATIN/NYSTOP) 100000 UNIT/GM POWD Apply 30 g topically 2 (two) times daily. 11/05/13   Encarnacion Slates, NP  traZODone (DESYREL) 50 MG tablet Take  1 tablet (50 mg total) by mouth at bedtime and may repeat dose one time if needed. For sleep 11/05/13   Encarnacion Slates, NP   BP 187/112 mmHg  Pulse 97  Temp(Src) 97.9 F (36.6 C) (Oral)  Resp 20  SpO2 99% Physical Exam  Constitutional: She is oriented to person, place, and time. She appears well-developed and well-nourished. No distress.  HENT:  Head: Normocephalic.  Eyes: EOM are normal. Pupils are equal, round, and reactive to light.  Neck: Normal range of motion. Neck supple.  Cardiovascular: Normal heart sounds and intact distal pulses.   Pulmonary/Chest: Effort normal and breath sounds normal.  Lymphadenopathy:    She has no cervical adenopathy.  Neurological: She is alert and oriented to person, place, and time.  Skin: Skin is warm and dry.  Nursing note and vitals reviewed.   ED Course  Procedures (including critical care time) Labs Review Labs Reviewed  POCT I-STAT, CHEM 8 - Abnormal; Notable for the following:    Glucose, Bld 110 (*)    All other components within normal limits   i-stat wnl. Imaging Review No results found.   MDM   1. Essential hypertension        Billy Fischer, MD 07/12/14 1446

## 2016-02-09 ENCOUNTER — Ambulatory Visit (HOSPITAL_COMMUNITY)
Admission: EM | Admit: 2016-02-09 | Discharge: 2016-02-09 | Disposition: A | Discharge status: Home / Self Care | Payer: Self-pay | Attending: Family Medicine | Admitting: Family Medicine

## 2016-02-09 ENCOUNTER — Encounter (HOSPITAL_COMMUNITY): Payer: Self-pay | Admitting: Emergency Medicine

## 2016-02-09 DIAGNOSIS — I10 Essential (primary) hypertension: Secondary | ICD-10-CM

## 2016-02-09 DIAGNOSIS — R609 Edema, unspecified: Secondary | ICD-10-CM

## 2016-02-09 LAB — GLUCOSE, CAPILLARY: Glucose-Capillary: 80 mg/dL (ref 65–99)

## 2016-02-09 MED ORDER — FUROSEMIDE 40 MG PO TABS: ORAL_TABLET | ORAL | Status: DC

## 2016-02-09 MED ORDER — ATENOLOL 50 MG PO TABS: 50.0000 mg | ORAL_TABLET | Freq: Every day | ORAL | Status: DC

## 2016-02-09 NOTE — ED Notes (Signed)
Patient reports bilateral leg swelling .  Patient reports left leg swelling for 2-3 days, right leg started yesterday.  Patient reports she feels swelling into hips.  Patient said she took a "diuretic" on Saturday, but no improvement and seemed to worsen.  Patient reports she used to be on blood pressure medicine, patient stopped taking 8 months ago when unable to buy.

## 2016-02-09 NOTE — ED Provider Notes (Signed)
CSN: [id number]     Arrival date & time 02/09/16  1501 History   First MD Initiated Contact with Patient 02/09/16 1711     Chief Complaint  Patient presents with  . Leg Swelling   (Consider location/radiation/quality/duration/timing/severity/associated sxs/prior Treatment) HPI History obtained from patient:  Pt presents with the cc of:  Bilateral leg swelling Duration of symptoms: ongoing for several weeks Treatment prior to arrival: took an OTC Diuretic on Saturday Context: The patient had been on antihypertensive medicines but stopped due to cost now she has bilateral leg swelling no shortness of breath no chest pain. Ace that the swelling doesn't improve some when she gets up in the morning. Other symptoms include: Elevated blood pressure Pain score: 3 FAMILY HISTORY: Diabetes in multiple members of the family    Past Medical History  Diagnosis Date  . Diverticulitis   . Hypertension    Past Surgical History  Procedure Laterality Date  . Tubal ligation     No family history on file. Social History  Substance Use Topics  . Smoking status: Never Smoker   . Smokeless tobacco: None  . Alcohol Use: 6.0 oz/week    10 Shots of liquor per week   OB History    No data available     Review of Systems  Denies: HEADACHE, NAUSEA, ABDOMINAL PAIN, CHEST PAIN, CONGESTION, DYSURIA, SHORTNESS OF BREATH  Allergies  Review of patient's allergies indicates no known allergies.  Home Medications   Prior to Admission medications   Medication Sig Start Date End Date Taking? Authorizing Provider  amLODipine (NORVASC) 10 MG tablet Take 1 tablet (10 mg total) by mouth daily. 07/09/14   Billy Fischer, MD  benazepril (LOTENSIN) 10 MG tablet Take 1 tablet (10 mg total) by mouth daily. For hypentesion 11/05/13   Encarnacion Slates, NP  nystatin (MYCOSTATIN/NYSTOP) 100000 UNIT/GM POWD Apply 30 g topically 2 (two) times daily. 11/05/13   Encarnacion Slates, NP  traZODone (DESYREL) 50 MG tablet Take 1 tablet  (50 mg total) by mouth at bedtime and may repeat dose one time if needed. For sleep 11/05/13   Encarnacion Slates, NP   Meds Ordered and Administered this Visit  Medications - No data to display  BP 153/105 mmHg  Pulse 76  Temp(Src) 98.4 F (36.9 C) (Oral)  Resp 20  SpO2 96% No data found.   Physical Exam NURSES NOTES AND VITAL SIGNS REVIEWED. CONSTITUTIONAL: Well developed, well nourished, no acute distress HEENT: normocephalic, atraumatic EYES: Conjunctiva normal NECK:normal ROM, supple, no adenopathy PULMONARY:No respiratory distress, normal effort no crackles CVS: rrr, without murmur or gallop. No JVD ABDOMINAL: Soft, ND, NT BS+, No CVAT MUSCULOSKELETAL: Normal ROM of all extremities, Bilateral pitting edema above knee both legs SKIN: warm and dry without rash PSYCHIATRIC: Mood and affect, behavior are normal  ED Course  Procedures (including critical care time)  Labs Review Labs Reviewed - No data to display  Imaging Review No results found.   Visual Acuity Review  Right Eye Distance:   Left Eye Distance:   Bilateral Distance:    Right Eye Near:   Left Eye Near:    Bilateral Near:       RX furosemide   MDM   1. Dependent edema   2. Essential hypertension     Patient is reassured that there are no issues that require transfer to higher level of care at this time or additional tests. Patient is advised to continue home symptomatic treatment. Patient is  advised that if there are new or worsening symptoms to attend the emergency department, contact primary care provider, or return to [facility]. Instructions of care provided discharged home in stable condition.    THIS NOTE WAS GENERATED USING A VOICE RECOGNITION SOFTWARE PROGRAM. ALL REASONABLE EFFORTS  WERE MADE TO PROOFREAD THIS DOCUMENT FOR ACCURACY.  I have verbally reviewed the discharge instructions with the patient. A printed AVS was given to the patient.  All questions were answered prior to discharge.       Konrad Felix, PA 02/09/16 2017

## 2016-02-09 NOTE — Discharge Instructions (Signed)
DASH Eating Plan °DASH stands for "Dietary Approaches to Stop Hypertension." The DASH eating plan is a healthy eating plan that has been shown to reduce high blood pressure (hypertension). Additional health benefits may include reducing the risk of type 2 diabetes mellitus, heart disease, and stroke. The DASH eating plan may also help with weight loss. °WHAT DO I NEED TO KNOW ABOUT THE DASH EATING PLAN? °For the DASH eating plan, you will follow these general guidelines: °· Choose foods with a percent daily value for sodium of less than 5% (as listed on the food label). °· Use salt-free seasonings or herbs instead of table salt or sea salt. °· Check with your health care provider or pharmacist before using salt substitutes. °· Eat lower-sodium products, often labeled as "lower sodium" or "no salt added." °· Eat fresh foods. °· Eat more vegetables, fruits, and low-fat dairy products. °· Choose whole grains. Look for the word "whole" as the first word in the ingredient list. °· Choose fish and skinless chicken or turkey more often than red meat. Limit fish, poultry, and meat to 6 oz (170 g) each day. °· Limit sweets, desserts, sugars, and sugary drinks. °· Choose heart-healthy fats. °· Limit cheese to 1 oz (28 g) per day. °· Eat more home-cooked food and less restaurant, buffet, and fast food. °· Limit fried foods. °· Cook foods using methods other than frying. °· Limit canned vegetables. If you do use them, rinse them well to decrease the sodium. °· When eating at a restaurant, ask that your food be prepared with less salt, or no salt if possible. °WHAT FOODS CAN I EAT? °Seek help from a dietitian for individual calorie needs. °Grains °Whole grain or whole wheat bread. Brown rice. Whole grain or whole wheat pasta. Quinoa, bulgur, and whole grain cereals. Low-sodium cereals. Corn or whole wheat flour tortillas. Whole grain cornbread. Whole grain crackers. Low-sodium crackers. °Vegetables °Fresh or frozen vegetables  (raw, steamed, roasted, or grilled). Low-sodium or reduced-sodium tomato and vegetable juices. Low-sodium or reduced-sodium tomato sauce and paste. Low-sodium or reduced-sodium canned vegetables.  °Fruits °All fresh, canned (in natural juice), or frozen fruits. °Meat and Other Protein Products °Ground beef (85% or leaner), grass-fed beef, or beef trimmed of fat. Skinless chicken or turkey. Ground chicken or turkey. Pork trimmed of fat. All fish and seafood. Eggs. Dried beans, peas, or lentils. Unsalted nuts and seeds. Unsalted canned beans. °Dairy °Low-fat dairy products, such as skim or 1% milk, 2% or reduced-fat cheeses, low-fat ricotta or cottage cheese, or plain low-fat yogurt. Low-sodium or reduced-sodium cheeses. °Fats and Oils °Tub margarines without trans fats. Light or reduced-fat mayonnaise and salad dressings (reduced sodium). Avocado. Safflower, olive, or canola oils. Natural peanut or almond butter. °Other °Unsalted popcorn and pretzels. °The items listed above may not be a complete list of recommended foods or beverages. Contact your dietitian for more options. °WHAT FOODS ARE NOT RECOMMENDED? °Grains °White bread. White pasta. White rice. Refined cornbread. Bagels and croissants. Crackers that contain trans fat. °Vegetables °Creamed or fried vegetables. Vegetables in a cheese sauce. Regular canned vegetables. Regular canned tomato sauce and paste. Regular tomato and vegetable juices. °Fruits °Dried fruits. Canned fruit in light or heavy syrup. Fruit juice. °Meat and Other Protein Products °Fatty cuts of meat. Ribs, chicken wings, bacon, sausage, bologna, salami, chitterlings, fatback, hot dogs, bratwurst, and packaged luncheon meats. Salted nuts and seeds. Canned beans with salt. °Dairy °Whole or 2% milk, cream, half-and-half, and cream cheese. Whole-fat or sweetened yogurt. Full-fat   cheeses or blue cheese. Nondairy creamers and whipped toppings. Processed cheese, cheese spreads, or cheese  curds. Condiments Onion and garlic salt, seasoned salt, table salt, and sea salt. Canned and packaged gravies. Worcestershire sauce. Tartar sauce. Barbecue sauce. Teriyaki sauce. Soy sauce, including reduced sodium. Steak sauce. Fish sauce. Oyster sauce. Cocktail sauce. Horseradish. Ketchup and mustard. Meat flavorings and tenderizers. Bouillon cubes. Hot sauce. Tabasco sauce. Marinades. Taco seasonings. Relishes. Fats and Oils Butter, stick margarine, lard, shortening, ghee, and bacon fat. Coconut, palm kernel, or palm oils. Regular salad dressings. Other Pickles and olives. Salted popcorn and pretzels. The items listed above may not be a complete list of foods and beverages to avoid. Contact your dietitian for more information. WHERE CAN I FIND MORE INFORMATION? National Heart, Lung, and Blood Institute: travelstabloid.com   This information is not intended to replace advice given to you by your health care provider. Make sure you discuss any questions you have with your health care provider.   Document Released: 08/05/2011 Document Revised: 09/06/2014 Document Reviewed: 06/20/2013 Elsevier Interactive Patient Education 2016 [employer]  Edema Edema is an abnormal buildup of fluids. It is more common in your legs and thighs. Painless swelling of the feet and ankles is more likely as a person ages. It also is common in looser skin, like around your eyes. HOME CARE   Keep the affected body part above the level of the heart while lying down.  Do not sit still or stand for a long time.  Do not put anything right under your knees when you lie down.  Do not wear tight clothes on your upper legs.  Exercise your legs to help the puffiness (swelling) go down.  Wear elastic bandages or support stockings as told by your doctor.  A low-salt diet may help lessen the puffiness.  Only take medicine as told by your doctor. GET HELP IF:  Treatment is not  working.  You have heart, liver, or kidney disease and notice that your skin looks puffy or shiny.  You have puffiness in your legs that does not get better when you raise your legs.  You have sudden weight gain for no reason. GET HELP RIGHT AWAY IF:   You have shortness of breath or chest pain.  You cannot breathe when you lie down.  You have pain, redness, or warmth in the areas that are puffy.  You have heart, liver, or kidney disease and get edema all of a sudden.  You have a fever and your symptoms get worse all of a sudden. MAKE SURE YOU:   Understand these instructions.  Will watch your condition.  Will get help right away if you are not doing well or get worse.   This information is not intended to replace advice given to you by your health care provider. Make sure you discuss any questions you have with your health care provider.   Document Released: 02/02/2008 Document Revised: 08/21/2013 Document Reviewed: 06/08/2013 Elsevier Interactive Patient Education [employer].

## 2016-10-12 ENCOUNTER — Emergency Department (HOSPITAL_COMMUNITY)
Admission: EM | Admit: 2016-10-12 | Discharge: 2016-10-12 | Disposition: A | Discharge status: Home / Self Care | Payer: Self-pay | Attending: Emergency Medicine | Admitting: Emergency Medicine

## 2016-10-12 ENCOUNTER — Encounter (HOSPITAL_COMMUNITY): Payer: Self-pay

## 2016-10-12 ENCOUNTER — Emergency Department (HOSPITAL_COMMUNITY): Payer: Self-pay

## 2016-10-12 DIAGNOSIS — Z79899 Other long term (current) drug therapy: Secondary | ICD-10-CM | POA: Insufficient documentation

## 2016-10-12 DIAGNOSIS — I10 Essential (primary) hypertension: Secondary | ICD-10-CM | POA: Insufficient documentation

## 2016-10-12 DIAGNOSIS — K2920 Alcoholic gastritis without bleeding: Secondary | ICD-10-CM | POA: Insufficient documentation

## 2016-10-12 LAB — COMPREHENSIVE METABOLIC PANEL
ALT: 32 U/L (ref 14–54)
AST: 46 U/L — ABNORMAL HIGH (ref 15–41)
Albumin: 3.8 g/dL (ref 3.5–5.0)
Alkaline Phosphatase: 47 U/L (ref 38–126)
Anion gap: 11 (ref 5–15)
BUN: 8 mg/dL (ref 6–20)
CO2: 26 mmol/L (ref 22–32)
Calcium: 8.7 mg/dL — ABNORMAL LOW (ref 8.9–10.3)
Chloride: 108 mmol/L (ref 101–111)
Creatinine, Ser: 0.7 mg/dL (ref 0.44–1.00)
GFR calc Af Amer: >60 mL/min (ref >60)
GFR calc non Af Amer: >60 mL/min (ref >60)
Glucose, Bld: 119 mg/dL — ABNORMAL HIGH (ref 65–99)
Potassium: 3.5 mmol/L (ref 3.5–5.1)
Sodium: 145 mmol/L (ref 135–145)
Total Bilirubin: 0.7 mg/dL (ref 0.3–1.2)
Total Protein: 8.1 g/dL (ref 6.5–8.1)

## 2016-10-12 LAB — URINALYSIS, ROUTINE W REFLEX MICROSCOPIC
Bilirubin Urine: NEGATIVE
Glucose, UA: NEGATIVE mg/dL
Hgb urine dipstick: NEGATIVE
Ketones, ur: NEGATIVE mg/dL
Leukocytes, UA: NEGATIVE
Nitrite: POSITIVE — AB
Protein, ur: NEGATIVE mg/dL
RBC / HPF: NONE SEEN RBC/hpf (ref 0–5)
Specific Gravity, Urine: 1.02 (ref 1.005–1.030)
pH: 5 (ref 5.0–8.0)

## 2016-10-12 LAB — CBC
HCT: 39.5 % (ref 36.0–46.0)
Hemoglobin: 13.4 g/dL (ref 12.0–15.0)
MCH: 32 pg (ref 26.0–34.0)
MCHC: 33.9 g/dL (ref 30.0–36.0)
MCV: 94.3 fL (ref 78.0–100.0)
Platelets: 245 10*3/uL (ref 150–400)
RBC: 4.19 MIL/uL (ref 3.87–5.11)
RDW: 14.4 % (ref 11.5–15.5)
WBC: 6.3 10*3/uL (ref 4.0–10.5)

## 2016-10-12 LAB — RAPID URINE DRUG SCREEN, HOSP PERFORMED
Amphetamines: NOT DETECTED
Barbiturates: NOT DETECTED
Benzodiazepines: NOT DETECTED
Cocaine: NOT DETECTED
Opiates: NOT DETECTED
Tetrahydrocannabinol: POSITIVE — AB

## 2016-10-12 LAB — LIPASE, BLOOD: Lipase: 27 U/L (ref 11–51)

## 2016-10-12 LAB — ETHANOL: Alcohol, Ethyl (B): 195 mg/dL — ABNORMAL HIGH (ref <5)

## 2016-10-12 IMAGING — CR DG ABDOMEN ACUTE W/ 1V CHEST
5 series · 5 of 5 positions shown · non-contrast
Comparison: CT from [DATE]

CLINICAL DATA: Constipation for 4 days

EXAM:
DG ABDOMEN ACUTE W/ 1V CHEST

[w chest pa]
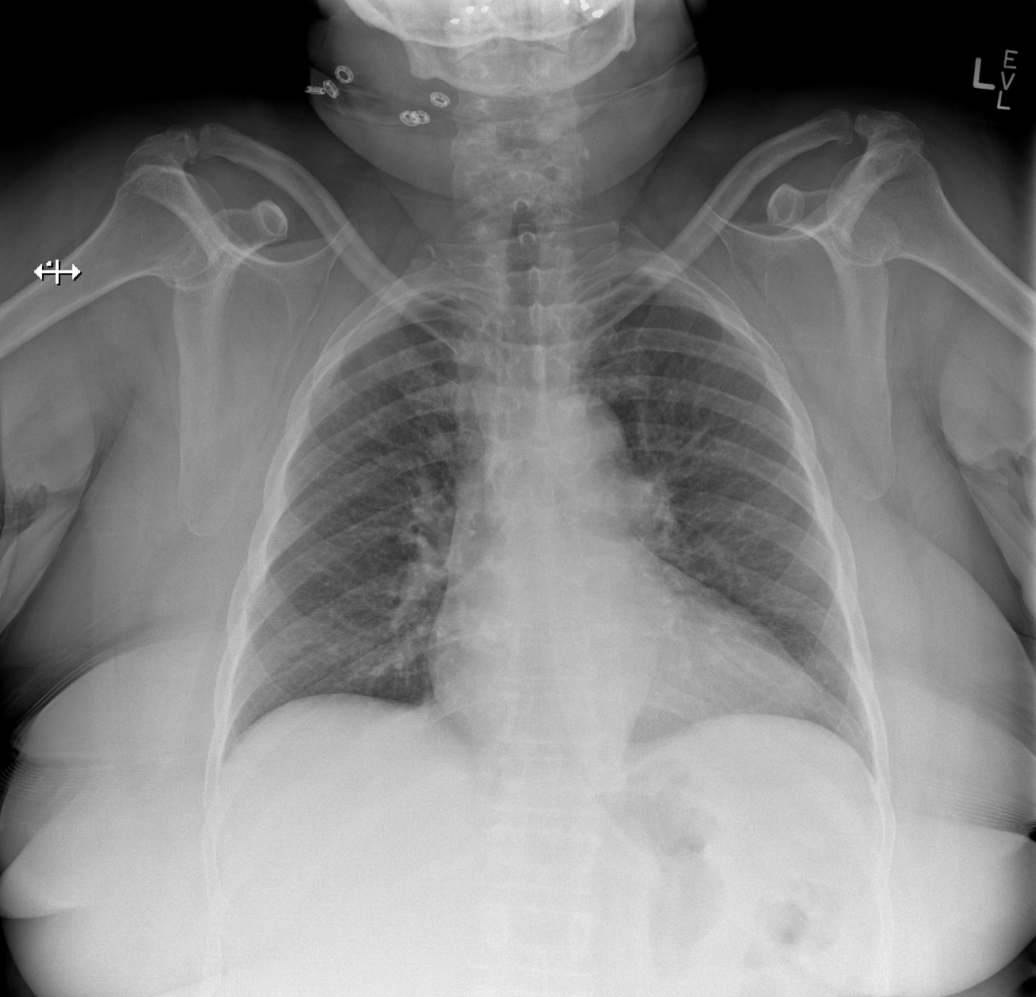

[w abdomen upright]
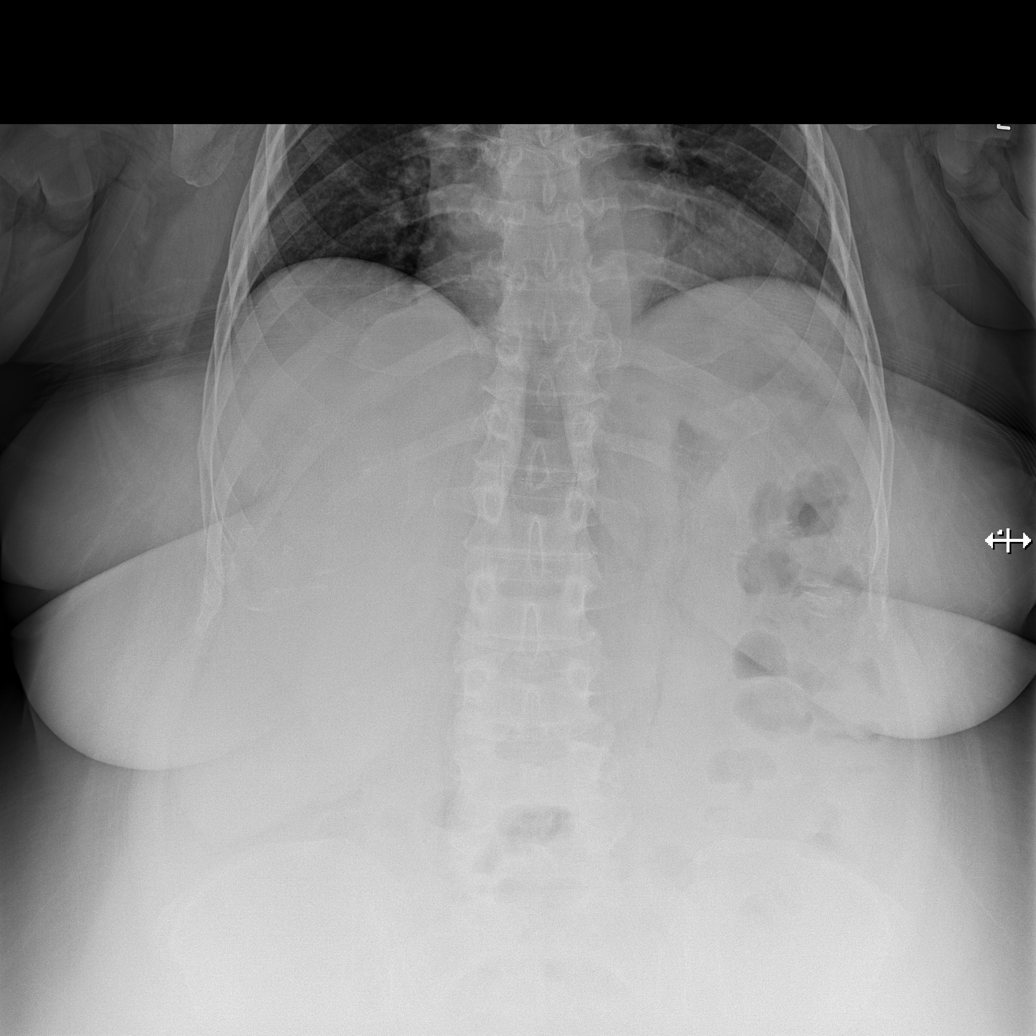

[t abdomen supine (1 of 3)]
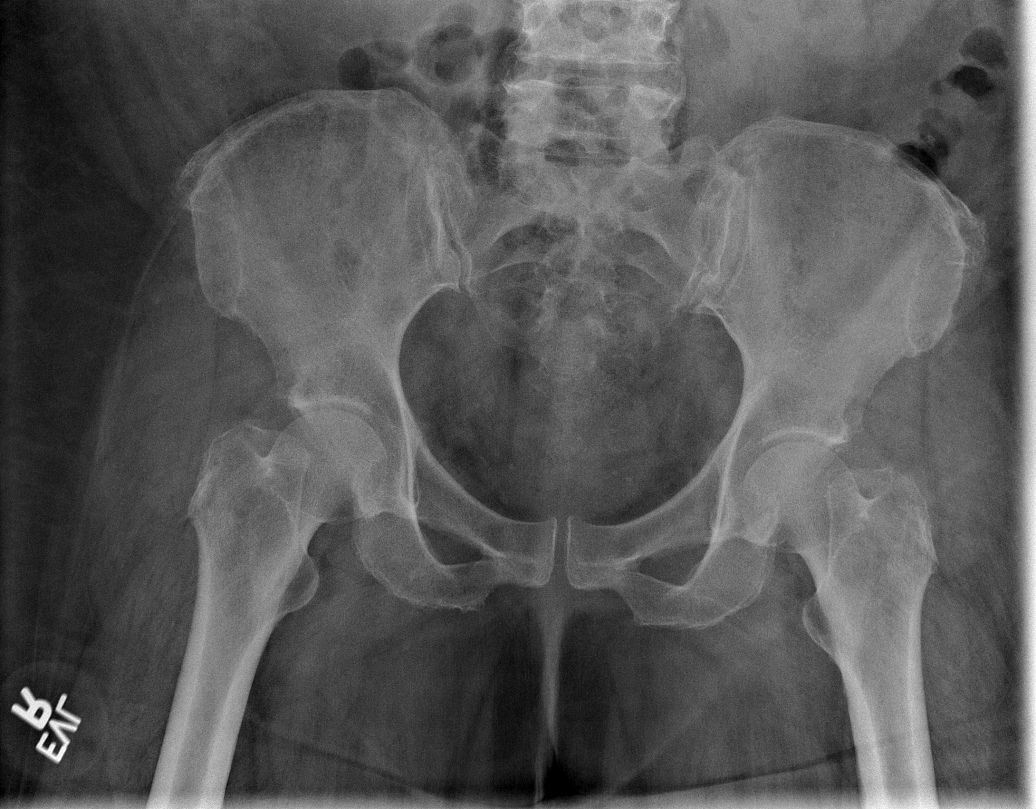

[t abdomen supine (2 of 3)]
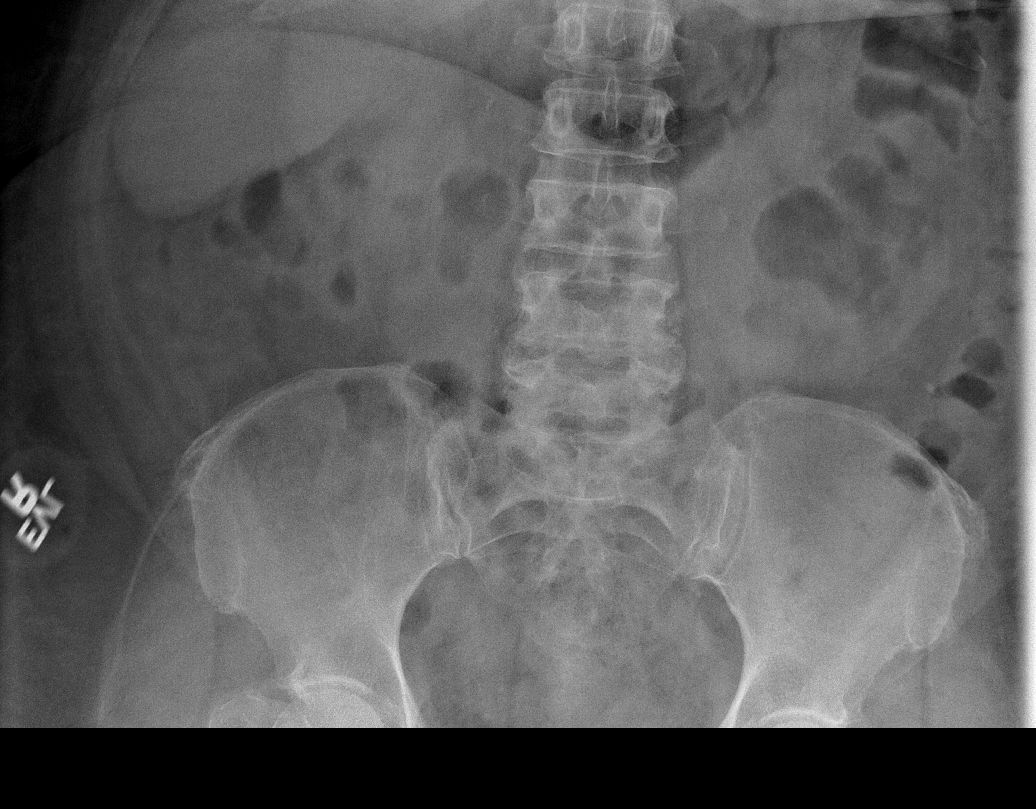

[t abdomen supine (3 of 3)]
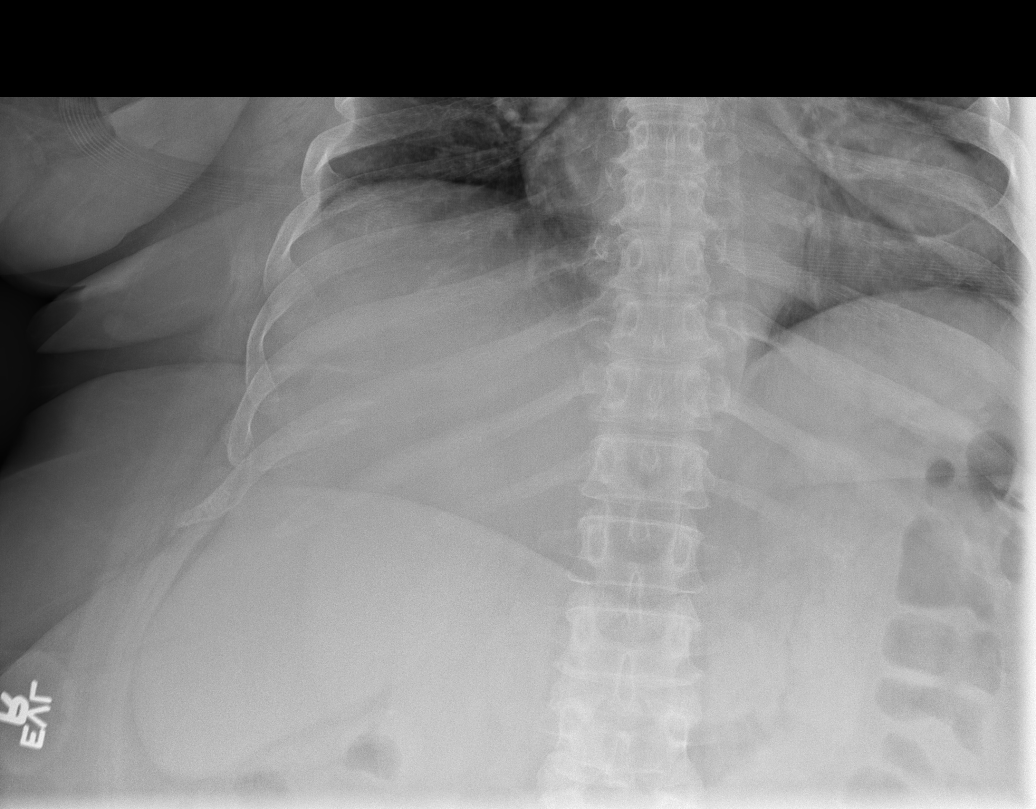

[5 of 5 positions shown; findings below may reference images not displayed]

FINDINGS: There is no evidence of dilated bowel loops or free intraperitoneal
air. No significant stool burden noted. No radiopaque calculi or
other significant radiographic abnormality is seen. There are tiny
phleboliths in the pelvis bilaterally. Heart size and mediastinal
contours are within normal limits. No pulmonary consolidation,
effusion or pneumothorax. Degenerative change along the AC joints
bilaterally and along the dorsal spine.
IMPRESSION: No bowel obstruction or significant retention of fecal residue
within large bowel. No acute cardiopulmonary disease.

## 2016-10-12 MED ORDER — GI COCKTAIL ~~LOC~~
30.0000 mL | Freq: Once | ORAL | Status: AC
  Administered 2016-10-12: 30 mL via ORAL
  Filled 2016-10-12: qty 30

## 2016-10-12 MED ORDER — FAMOTIDINE 20 MG PO TABS
40.0000 mg | ORAL_TABLET | Freq: Once | ORAL | Status: AC
  Administered 2016-10-12: 40 mg via ORAL
  Filled 2016-10-12: qty 2

## 2016-10-12 MED ORDER — SUCRALFATE 1 G PO TABS
1.0000 g | ORAL_TABLET | Freq: Once | ORAL | Status: AC
  Administered 2016-10-12: 1 g via ORAL
  Filled 2016-10-12: qty 1

## 2016-10-12 MED ORDER — SUCRALFATE 1 G PO TABS: 1.0000 g | ORAL_TABLET | Freq: Four times a day (QID) | ORAL | 0 refills | Status: DC

## 2016-10-12 MED ORDER — FAMOTIDINE 20 MG PO TABS: 20.0000 mg | ORAL_TABLET | Freq: Two times a day (BID) | ORAL | 0 refills | Status: DC

## 2016-10-12 NOTE — ED Provider Notes (Signed)
[facility] DEPT Provider Note   CSN: [id number] Arrival date & time: 10/12/16  1445     History   Chief Complaint Chief Complaint  Patient presents with  . Abdominal Pain  . Constipation    HPI Afsana Liera is a 61 y.o. female.  61 year old female presents with four-day history of abdominal discomfort. States that is from her constipation and that her last bowel movement was 4 days ago. Denies any fever or vomiting. Pain is crampy and diffuse across her entire abdomen. Patient does admit to EtOH use today. Has not used any medications for this prior to arrival nothing makes her symptoms better.      Past Medical History:  Diagnosis Date  . Diverticulitis   . Hypertension     Patient Active Problem List   Diagnosis Date Noted  . Alcohol-induced mood disorder ([place]) 11/01/2013  . Alcohol dependence ([place]) 11/01/2013  . Alcohol withdrawal ([place]) 10/31/2013  . HYPERTENSION 06/20/2007  . LOW BACK PAIN 06/20/2007  . OBESITY 05/05/2007  . DIVERTICULOSIS, COLON 05/05/2007    Past Surgical History:  Procedure Laterality Date  . TUBAL LIGATION      OB History    No data available       Home Medications    Prior to Admission medications   Medication Sig Start Date End Date Taking? Authorizing Provider  amLODipine (NORVASC) 10 MG tablet Take 1 tablet (10 mg total) by mouth daily. 07/09/14   Billy Fischer, MD  atenolol (TENORMIN) 50 MG tablet Take 1 tablet (50 mg total) by mouth daily. 02/09/16   Konrad Felix, PA  benazepril (LOTENSIN) 10 MG tablet Take 1 tablet (10 mg total) by mouth daily. For hypentesion 11/05/13   Encarnacion Slates, NP  furosemide (LASIX) 40 MG tablet Take 1 tablet twice daily for 7 days then 1 tablet daily. Weigh yourself daily. Eat a banana daily 02/09/16   Konrad Felix, PA  nystatin (MYCOSTATIN/NYSTOP) 100000 UNIT/GM POWD Apply 30 g topically 2 (two) times daily. 11/05/13   Encarnacion Slates, NP  traZODone (DESYREL) 50 MG tablet Take 1 tablet (50 mg  total) by mouth at bedtime and may repeat dose one time if needed. For sleep 11/05/13   Encarnacion Slates, NP    Family History History reviewed. No pertinent family history.  Social History Social History  Substance Use Topics  . Smoking status: Never Smoker  . Smokeless tobacco: Never Used  . Alcohol use 6.0 oz/week    10 Shots of liquor per week     Allergies   Patient has no known allergies.   Review of Systems Review of Systems  All other systems reviewed and are negative.    Physical Exam Updated Vital Signs BP 153/93 (BP Location: Left Arm)   Pulse 101   Temp 97.6 F (36.4 C) (Oral)   Resp 18   SpO2 100%   Physical Exam  Constitutional: She is oriented to person, place, and time. She appears well-developed and well-nourished.  Non-toxic appearance. No distress.  HENT:  Head: Normocephalic and atraumatic.  Eyes: Conjunctivae, EOM and lids are normal. Pupils are equal, round, and reactive to light.  Neck: Normal range of motion. Neck supple. No tracheal deviation present. No thyroid mass present.  Cardiovascular: Normal rate, regular rhythm and normal heart sounds.  Exam reveals no gallop.   No murmur heard. Pulmonary/Chest: Effort normal and breath sounds normal. No stridor. No respiratory distress. She has no decreased breath sounds. She has no  wheezes. She has no rhonchi. She has no rales.  Abdominal: Soft. Normal appearance and bowel sounds are normal. She exhibits no distension. There is no tenderness. There is no rigidity, no rebound, no guarding and no CVA tenderness.  Musculoskeletal: Normal range of motion. She exhibits no edema or tenderness.  Neurological: She is alert and oriented to person, place, and time. She has normal strength. No cranial nerve deficit or sensory deficit. GCS eye subscore is 4. GCS verbal subscore is 5. GCS motor subscore is 6.  Skin: Skin is warm and dry. No abrasion and no rash noted.  Psychiatric: Her speech is normal and behavior is  normal. Her mood appears anxious.  Nursing note and vitals reviewed.    ED Treatments / Results  Labs (all labs ordered are listed, but only abnormal results are displayed) Labs Reviewed  COMPREHENSIVE METABOLIC PANEL - Abnormal; Notable for the following:       Result Value   Glucose, Bld 119 (*)    Calcium 8.7 (*)    AST 46 (*)    All other components within normal limits  LIPASE, BLOOD  CBC  URINALYSIS, ROUTINE W REFLEX MICROSCOPIC    EKG  EKG Interpretation None       Radiology No results found.  Procedures Procedures (including critical care time)  Medications Ordered in ED Medications - No data to display   Initial Impression / Assessment and Plan / ED Course  I have reviewed the triage vital signs and the nursing notes.  Pertinent labs & imaging results that were available during my care of the patient were reviewed by me and considered in my medical decision making (see chart for details).     Patient is to drinking a pint of gin a day. States that she consumed a similar amount today. Suspect that she has gastritis was treated with Carafate and GI cocktail and she feels much better. Abdominal series was negative for retained stool or free air. Her overall level here today is 195. She is mildly tachycardic which I suspect is from that. Her repeat abdominal exam at time of discharge is still benign. She is encouraged to drink less alcohol  Final Clinical Impressions(s) / ED Diagnoses   Final diagnoses:  None    New Prescriptions New Prescriptions   No medications on file     [name] Leigh, MD 10/12/16 1920

## 2016-10-12 NOTE — ED Triage Notes (Signed)
Per EMS, pt from home.  Pt c/o abdominal pain and constipation.  LBM x 4 days ago.  Abdominal pain starting today.  No n/v.  Pt daily drinks and has ETOH on board.  Vitals:  210/86, hr 94, resp 20, 98% ra, cbg 151.  Noncompliant with meds.

## 2018-07-31 ENCOUNTER — Ambulatory Visit (HOSPITAL_COMMUNITY)
Admission: RE | Admit: 2018-07-31 | Discharge: 2018-07-31 | Disposition: A | Discharge status: Home / Self Care | Payer: Self-pay | Attending: Psychiatry | Admitting: Psychiatry

## 2018-07-31 DIAGNOSIS — F1010 Alcohol abuse, uncomplicated: Secondary | ICD-10-CM | POA: Insufficient documentation

## 2018-07-31 NOTE — BH Assessment (Signed)
Assessment Note  [name] is a 62 y.o. female who came to [facility] as a walk-in, requesting alcohol detox. Pt has been drinking for several years. She was hospitalized at [facility] in 2015 and stayed sober for 3 years. She was told by her son today that he would take her granddaughters away from her (they live with her: ages 70, 12, 40) if she didn't stop drinking. Pt is motivated to quit drinking. She denies SI, HI, AVH. She is not taking any medications and has no mental health issues.   Case staffed with Elmarie Shiley, [place]. Pt does not meet criteria for IP hospitalization. Pt is referred to [facility] for alcohol detox. Pt given information and very amenable to the help.   Diagnosis: F10.10 Alcohol abuse, uncomplicated  Past Medical History:  Past Medical History:  Diagnosis Date  . Diverticulitis   . Hypertension     Past Surgical History:  Procedure Laterality Date  . TUBAL LIGATION      Family History: No family history on file.  Social History:  reports that she has never smoked. She has never used smokeless tobacco. She reports that she drinks about 10.0 standard drinks of alcohol per week. She reports that she does not use drugs.  Additional Social History:  Alcohol / Drug Use Pain Medications: pt denies Prescriptions: pt denies Over the Counter: pt denies History of alcohol / drug use?: Yes Longest period of sobriety (when/how long): 3 years Substance #1 Name of Substance 1: Alcohol (liquor) 1 - Amount (size/oz): 1/2 gallon 1 - Frequency: every 2 days 1 - Duration: ongoing 1 - Last Use / Amount: this morning at 6am  CIWA:   COWS:    Allergies: No Known Allergies  Home Medications:  (Not in a hospital admission)  OB/GYN Status:  No LMP recorded. Patient is postmenopausal.  General Assessment Data Location of Assessment: [facility] Assessment: In system Is this a Tele or Face-to-Face Assessment?: Face-to-Face Is this an Initial Assessment or a  Re-assessment for this encounter?: Initial Assessment Patient Accompanied by:: N/A Language Other than English: No Living Arrangements: Other (Comment) What gender do you identify as?: Female Marital status: Divorced [place] name: Ervin Pregnancy Status: No Living Arrangements: Other relatives(3 grandchildren) Can pt return to current living arrangement?: Yes Admission Status: Voluntary Is patient capable of signing voluntary admission?: Yes Referral Source: Self/Family/Friend Insurance type: Medicaid  Medical Screening Exam ([place]) Medical Exam completed: Yes  Crisis Care Plan Living Arrangements: Other relatives(3 grandchildren) Name of Psychiatrist: none Name of Therapist: none  Education Status Is patient currently in school?: No Is the patient employed, unemployed or receiving disability?: Receiving disability income, Unemployed  Risk to self with the past 6 months Suicidal Ideation: No Has patient been a risk to self within the past 6 months prior to admission? : No Suicidal Intent: No Has patient had any suicidal intent within the past 6 months prior to admission? : No Is patient at risk for suicide?: No Suicidal Plan?: No Has patient had any suicidal plan within the past 6 months prior to admission? : No Access to Means: No Previous Attempts/Gestures: Yes How many times?: 2 Triggers for Past Attempts: Family contact Intentional Self Injurious Behavior: None Family Suicide History: No Recent stressful life event(s): Other (Comment) Persecutory voices/beliefs?: No Depression: No Substance abuse history and/or treatment for substance abuse?: Yes Suicide prevention information given to non-admitted patients: Not applicable  Risk to Others within the past 6 months Homicidal Ideation: No  Does patient have any lifetime risk of violence toward others beyond the six months prior to admission? : No Thoughts of Harm to Others: No Current Homicidal Intent:  No Current Homicidal Plan: No Access to Homicidal Means: No History of harm to others?: No Assessment of Violence: None Noted Does patient have access to weapons?: No Criminal Charges Pending?: No Does patient have a court date: No Is patient on probation?: No  Psychosis Hallucinations: None noted Delusions: None noted  Mental Status Report Appearance/Hygiene: Unremarkable Eye Contact: Good Motor Activity: Unremarkable Speech: Unremarkable Level of Consciousness: Alert Mood: Sad, Pleasant Affect: Appropriate to circumstance Anxiety Level: Moderate Thought Processes: Coherent, Relevant Judgement: Partial Orientation: Person, Place, Time, Situation Obsessive Compulsive Thoughts/Behaviors: None  Cognitive Functioning Concentration: Normal Memory: Recent Intact, Remote Intact Is patient IDD: No Insight: Fair Impulse Control: Fair Appetite: Fair Have you had any weight changes? : No Change Sleep: No Change Vegetative Symptoms: None  ADLScreening [facility] Assessment Services) Patient's cognitive ability adequate to safely complete daily activities?: Yes Patient able to express need for assistance with ADLs?: Yes Independently performs ADLs?: Yes (appropriate for developmental age)  Prior Inpatient Therapy Prior Inpatient Therapy: Yes Prior Therapy Dates: 2015 Prior Therapy Facilty/Provider(s): [facility] Reason for Treatment: Alcohol abuse  Prior Outpatient Therapy Prior Outpatient Therapy: No Does patient have an ACCT team?: No Does patient have Intensive In-House Services?  : No Does patient have [facility] services? : No Does patient have P4CC services?: No  ADL Screening (condition at time of admission) Patient's cognitive ability adequate to safely complete daily activities?: Yes Is the patient deaf or have difficulty hearing?: No Does the patient have difficulty seeing, even when wearing glasses/contacts?: No Does the patient have difficulty concentrating,  remembering, or making decisions?: No Patient able to express need for assistance with ADLs?: Yes Does the patient have difficulty dressing or bathing?: No Independently performs ADLs?: Yes (appropriate for developmental age) Does the patient have difficulty walking or climbing stairs?: No Weakness of Legs: None Weakness of Arms/Hands: None  Home Assistive Devices/Equipment Home Assistive Devices/Equipment: None    Abuse/Neglect Assessment (Assessment to be complete while patient is alone) Abuse/Neglect Assessment Can Be Completed: Yes Physical Abuse: Denies Verbal Abuse: Denies Sexual Abuse: Denies Exploitation of patient/patient's resources: Denies Self-Neglect: Denies     Regulatory affairs officer (For Healthcare) Does Patient Have a Medical Advance Directive?: No          Disposition:  Disposition Initial Assessment Completed for this Encounter: Yes Disposition of Patient: Discharge Patient refused recommended treatment: No Mode of transportation if patient is discharged?: Car Patient referred to: [facility]  On Site Evaluation by:   Reviewed with Physician:    Rexene Edison 07/31/2018 9:19 AM

## 2018-07-31 NOTE — H&P (Signed)
Behavioral Health Medical Screening Exam  [name] is an 62 y.o. female who presents as a walk in due to alcohol abuse. Patient states "I drink a half gallon of liquor everyday. My son upset me by saying he would take away my grandchildren. I said I would harm myself but I don't want to do that. I just don't want my grandchildren taken away." [name] appeared intoxicated during the assessment. Patient is requesting residential treatment for alcohol abuse. She does not meet criteria for inpatient psychiatric hospitalization. Patient was provided with referral to [facility] to obtain help with her problematic use of alcohol.   Total Time spent with patient: 20 minutes  Psychiatric Specialty Exam: Physical Exam  Constitutional: She is oriented to person, place, and time. She appears well-developed and well-nourished.  Cardiovascular: Normal rate, regular rhythm, normal heart sounds and intact distal pulses.  Respiratory: Effort normal and breath sounds normal.  GI: Soft. Bowel sounds are normal.  Musculoskeletal: Normal range of motion.  Neurological: She is alert and oriented to person, place, and time.  Skin: Skin is warm and dry.    ROS  Blood pressure (!) 151/90, pulse (!) 107, temperature 98.7 F (37.1 C), resp. rate 18, SpO2 95 %.There is no height or weight on file to calculate BMI.  General Appearance: Disheveled  Eye Contact:  Fair  Speech:  Garbled  Volume:  Normal  Mood:  Anxious  Affect:  Tearful  Thought Process:  Coherent  Orientation:  Full (Time, Place, and Person)  Thought Content:  Rumination  Suicidal Thoughts:  No  Homicidal Thoughts:  No  Memory:  Immediate;   Fair Recent;   Good Remote;   Good  Judgement:  Poor  Insight:  Fair  Psychomotor Activity:  Restlessness  Concentration: Concentration: Fair and Attention Span: Fair  Recall:  [employer] of Knowledge:Fair  Language: Good  Akathisia:  No  Handed:  Right  AIMS (if indicated):     Assets:  Communication  Skills Desire for Improvement Financial Resources/Insurance Housing Intimacy Leisure Time Physical Health Resilience Social Support  Sleep:       Musculoskeletal: Strength & Muscle Tone: within normal limits Gait & Station: normal Patient leans: N/A  Blood pressure (!) 151/90, pulse (!) 107, temperature 98.7 F (37.1 C), resp. rate 18, SpO2 95 %.  Recommendations:  Based on my evaluation the patient does not appear to have an emergency medical condition.  Elmarie Shiley, NP 07/31/2018, 9:37 AM

## 2018-11-03 ENCOUNTER — Other Ambulatory Visit: Payer: Self-pay

## 2018-11-03 ENCOUNTER — Emergency Department (HOSPITAL_COMMUNITY)
Admission: EM | Admit: 2018-11-03 | Discharge: 2018-11-03 | Disposition: A | Discharge status: Home / Self Care | Payer: Medicaid Other | Attending: Emergency Medicine | Admitting: Emergency Medicine

## 2018-11-03 ENCOUNTER — Encounter (HOSPITAL_COMMUNITY): Payer: Self-pay

## 2018-11-03 DIAGNOSIS — R000 Tachycardia, unspecified: Secondary | ICD-10-CM | POA: Diagnosis not present

## 2018-11-03 DIAGNOSIS — R822 Biliuria: Secondary | ICD-10-CM | POA: Insufficient documentation

## 2018-11-03 DIAGNOSIS — R609 Edema, unspecified: Secondary | ICD-10-CM | POA: Diagnosis not present

## 2018-11-03 DIAGNOSIS — I10 Essential (primary) hypertension: Secondary | ICD-10-CM | POA: Insufficient documentation

## 2018-11-03 DIAGNOSIS — Z79899 Other long term (current) drug therapy: Secondary | ICD-10-CM | POA: Diagnosis not present

## 2018-11-03 DIAGNOSIS — R319 Hematuria, unspecified: Secondary | ICD-10-CM | POA: Diagnosis not present

## 2018-11-03 DIAGNOSIS — R52 Pain, unspecified: Secondary | ICD-10-CM | POA: Diagnosis not present

## 2018-11-03 LAB — CBC
HCT: 36.4 % (ref 36.0–46.0)
Hemoglobin: 12.6 g/dL (ref 12.0–15.0)
MCH: 33.1 pg (ref 26.0–34.0)
MCHC: 34.6 g/dL (ref 30.0–36.0)
MCV: 95.5 fL (ref 80.0–100.0)
Platelets: 148 10*3/uL — ABNORMAL LOW (ref 150–400)
RBC: 3.81 MIL/uL — ABNORMAL LOW (ref 3.87–5.11)
RDW: 13.1 % (ref 11.5–15.5)
WBC: 6.3 10*3/uL (ref 4.0–10.5)
nRBC: 0.3 % — ABNORMAL HIGH (ref 0.0–0.2)

## 2018-11-03 LAB — URINALYSIS, ROUTINE W REFLEX MICROSCOPIC
Glucose, UA: NEGATIVE mg/dL
Hgb urine dipstick: NEGATIVE
Ketones, ur: 20 mg/dL — AB
Leukocytes,Ua: NEGATIVE
Nitrite: NEGATIVE
Protein, ur: 30 mg/dL — AB
Specific Gravity, Urine: 1.025 (ref 1.005–1.030)
pH: 5 (ref 5.0–8.0)

## 2018-11-03 LAB — HEPATIC FUNCTION PANEL
ALT: 184 U/L — ABNORMAL HIGH (ref 0–44)
AST: 601 U/L — ABNORMAL HIGH (ref 15–41)
Albumin: 3.4 g/dL — ABNORMAL LOW (ref 3.5–5.0)
Alkaline Phosphatase: 76 U/L (ref 38–126)
Bilirubin, Direct: 4.1 mg/dL — ABNORMAL HIGH (ref 0.0–0.2)
Indirect Bilirubin: 4 mg/dL — ABNORMAL HIGH (ref 0.3–0.9)
Total Bilirubin: 8.1 mg/dL — ABNORMAL HIGH (ref 0.3–1.2)
Total Protein: 8 g/dL (ref 6.5–8.1)

## 2018-11-03 LAB — BASIC METABOLIC PANEL
Anion gap: 19 — ABNORMAL HIGH (ref 5–15)
BUN: 15 mg/dL (ref 8–23)
CO2: 31 mmol/L (ref 22–32)
Calcium: 8.5 mg/dL — ABNORMAL LOW (ref 8.9–10.3)
Chloride: 82 mmol/L — ABNORMAL LOW (ref 98–111)
Creatinine, Ser: 1 mg/dL (ref 0.44–1.00)
GFR calc Af Amer: >60 mL/min (ref >60)
GFR calc non Af Amer: 60 mL/min — ABNORMAL LOW (ref >60)
Glucose, Bld: 116 mg/dL — ABNORMAL HIGH (ref 70–99)
Potassium: 3.3 mmol/L — ABNORMAL LOW (ref 3.5–5.1)
Sodium: 132 mmol/L — ABNORMAL LOW (ref 135–145)

## 2018-11-03 MED ORDER — POTASSIUM CHLORIDE CRYS ER 20 MEQ PO TBCR
40.0000 meq | EXTENDED_RELEASE_TABLET | Freq: Once | ORAL | Status: AC
  Administered 2018-11-03: 40 meq via ORAL
  Filled 2018-11-03: qty 2

## 2018-11-03 NOTE — Discharge Instructions (Addendum)
Dear [name]  You came to [place] with concerns of dark urine. We have determined this was caused by bilirubin. Here are our recommendations for you at discharge:  Please make sure to follow up with a primary care provider. Make sure to return tomorrow for scan of your left leg.  Thank you for choosing [facility]

## 2018-11-03 NOTE — ED Triage Notes (Signed)
[facility]- pt coming from home with complaint of blood in her urine X1 this morning. No prior medical care. Pt alert and oriented. Pt also c/o leg swelling.   140/90 104 pulse 18 rr SPO2 100 cbg 136

## 2018-11-03 NOTE — ED Notes (Signed)
Patient verbalizes understanding of discharge instructions. Opportunity for questioning and answers were provided. Armband removed by staff, pt discharged from ED via wheelchair w/ son

## 2018-11-03 NOTE — ED Provider Notes (Addendum)
[facility] EMERGENCY DEPARTMENT Provider Note   CSN: [id number] Arrival date & time: 11/03/18  1547    History   Chief Complaint Chief Complaint  Patient presents with  . Leg Swelling    HPI [name] is a 62 y.o. female w/ PMH of alcohol use disorder, morbid obesity, hypertension and diverticulosis presenting with dark urine. She states she was in her usual state of health until earlier this morning when she noted darker yellow color of her urine. She mentions that she also noted increased unilateral swelling of her left lower extremity. She mentions she has chronic bilateral lower extremity at baseline but she feels her left side appear bigger today with increasing pain of that side. Denies any F/N/V/D/C. Denies any dysuria, urgency, frequency.   Past Medical History:  Diagnosis Date  . Diverticulitis   . Hypertension     Patient Active Problem List   Diagnosis Date Noted  . Alcohol-induced mood disorder ([place]) 11/01/2013  . Alcohol dependence ([place]) 11/01/2013  . Alcohol withdrawal ([place]) 10/31/2013  . HYPERTENSION 06/20/2007  . LOW BACK PAIN 06/20/2007  . OBESITY 05/05/2007  . DIVERTICULOSIS, COLON 05/05/2007    Past Surgical History:  Procedure Laterality Date  . TUBAL LIGATION       OB History   No obstetric history on file.      Home Medications    Prior to Admission medications   Medication Sig Start Date End Date Taking? Authorizing Provider  Acetaminophen-Aspirin Buffered (EXCEDRIN BACK & BODY) 250-250 MG tablet Take 2 tablets by mouth every 4 (four) hours as needed for pain.    [provider]  amLODipine (NORVASC) 10 MG tablet Take 1 tablet (10 mg total) by mouth daily. Patient not taking: Reported on 10/12/2016 07/09/14   Billy Fischer, MD  atenolol (TENORMIN) 50 MG tablet Take 1 tablet (50 mg total) by mouth daily. Patient not taking: Reported on 10/12/2016 02/09/16   Konrad Felix, PA  benazepril (LOTENSIN) 10 MG  tablet Take 1 tablet (10 mg total) by mouth daily. For hypentesion Patient not taking: Reported on 10/12/2016 11/05/13   Lindell Spar I, NP  BIOTIN PO Take 1 tablet by mouth daily.    [provider]  famotidine (PEPCID) 20 MG tablet Take 1 tablet (20 mg total) by mouth 2 (two) times daily. 10/12/16   [name] Leigh, MD  furosemide (LASIX) 40 MG tablet Take 1 tablet twice daily for 7 days then 1 tablet daily. Weigh yourself daily. Eat a banana daily Patient not taking: Reported on 10/12/2016 02/09/16   Konrad Felix, PA  Misc Natural Products (LAXATIVE FORMULA) TABS Take 1 tablet by mouth once.    [provider]  Multiple Vitamins-Minerals (CENTRUM SILVER ADULT 50+) TABS Take 1 tablet by mouth daily.    [provider]  nystatin (MYCOSTATIN/NYSTOP) 100000 UNIT/GM POWD Apply 30 g topically 2 (two) times daily. Patient not taking: Reported on 10/12/2016 11/05/13   Lindell Spar I, NP  sucralfate (CARAFATE) 1 g tablet Take 1 tablet (1 g total) by mouth 4 (four) times daily. 10/12/16   [name] Leigh, MD  traZODone (DESYREL) 50 MG tablet Take 1 tablet (50 mg total) by mouth at bedtime and may repeat dose one time if needed. For sleep Patient not taking: Reported on 10/12/2016 11/05/13   Encarnacion Slates, NP    Family History History reviewed. No pertinent family history.  Social History Social History   Tobacco Use  . Smoking status: Never  Smoker  . Smokeless tobacco: Never Used  Substance Use Topics  . Alcohol use: Yes    Alcohol/week: 10.0 standard drinks    Types: 10 Shots of liquor per week  . Drug use: No     Allergies   Patient has no known allergies.   Review of Systems Review of Systems  Constitutional: Negative for chills, fatigue and fever.  Eyes: Positive for discharge. Negative for pain, itching and visual disturbance.  Respiratory: Negative for shortness of breath and wheezing.   Cardiovascular: Positive for leg swelling. Negative for chest pain and  palpitations.  Gastrointestinal: Negative for constipation, diarrhea, nausea and vomiting.  Genitourinary: Negative for dysuria, frequency and urgency.  Musculoskeletal: Negative for back pain, joint swelling and myalgias.  Neurological: Negative for dizziness, weakness and light-headedness.     Physical Exam Updated Vital Signs BP 124/67 (BP Location: Right Arm)   Pulse (!) 101   Temp 98.7 F (37.1 C) (Oral)   Resp 18   SpO2 100%   Physical Exam Constitutional:      General: She is not in acute distress.    Appearance: She is obese.     Comments: unkempt  HENT:     Head: Normocephalic and atraumatic.     Mouth/Throat:     Mouth: Mucous membranes are moist.     Pharynx: Oropharynx is clear. No oropharyngeal exudate.  Eyes:     General: Scleral icterus present.     Extraocular Movements: Extraocular movements intact.     Pupils: Pupils are equal, round, and reactive to light.  Cardiovascular:     Rate and Rhythm: Normal rate and regular rhythm.     Pulses: Normal pulses.     Heart sounds: Normal heart sounds. No murmur.  Pulmonary:     Effort: Pulmonary effort is normal.     Breath sounds: Normal breath sounds. No wheezing or rales.  Abdominal:     General: Abdomen is flat. Bowel sounds are normal.  Musculoskeletal: Normal range of motion.        General: Swelling (bilateral 2+ pitting edema up to mid shin. Appear symmetric) present.  Skin:    General: Skin is warm and dry.     Coloration: Skin is jaundiced.     Findings: No erythema or rash.  Neurological:     General: No focal deficit present.     Mental Status: She is alert and oriented to person, place, and time.      ED Treatments / Results  Labs (all labs ordered are listed, but only abnormal results are displayed) Labs Reviewed  URINALYSIS, ROUTINE W REFLEX MICROSCOPIC - Abnormal; Notable for the following components:      Result Value   Color, Urine AMBER (*)    APPearance HAZY (*)    Bilirubin  Urine MODERATE (*)    Ketones, ur 20 (*)    Protein, ur 30 (*)    Bacteria, UA MANY (*)    All other components within normal limits  CBC - Abnormal; Notable for the following components:   RBC 3.81 (*)    Platelets 148 (*)    nRBC 0.3 (*)    All other components within normal limits  BASIC METABOLIC PANEL - Abnormal; Notable for the following components:   Sodium 132 (*)    Potassium 3.3 (*)    Chloride 82 (*)    Glucose, Bld 116 (*)    Calcium 8.5 (*)    GFR calc non Af Amer 60 (*)  Anion gap 19 (*)    All other components within normal limits  HEPATIC FUNCTION PANEL - Abnormal; Notable for the following components:   Albumin 3.4 (*)    AST 601 (*)    ALT 184 (*)    Total Bilirubin 8.1 (*)    Bilirubin, Direct 4.1 (*)    Indirect Bilirubin 4.0 (*)    All other components within normal limits    EKG None  Radiology No results found.  Procedures Procedures (including critical care time)  Medications Ordered in ED Medications  potassium chloride SA (K-DUR,KLOR-CON) CR tablet 40 mEq (40 mEq Oral Given 11/03/18 2035)     Initial Impression / Assessment and Plan / ED Course  I have reviewed the triage vital signs and the nursing notes.  Pertinent labs & imaging results that were available during my care of the patient were reviewed by me and considered in my medical decision making (see chart for details).   Ms.Garrott is a 64 yo F w/ PMH of alcohol use disorder, morbid obesity, HTN, diverticulosis presenting with dark color. Explained the results of her urine color change as bilirubinuria. Chart review shows prior episodes of bilirubinuria in the past. Described the effects of risks and dangers of alcohol use including liver injury and low albumin increasing risk of swelling. Wells-score of 1 due to immobilization 2/2 body habitus.   Final Clinical Impressions(s) / ED Diagnoses   Final diagnoses:  Bilirubinuria   Ms.Brisby is a 63 yo F presenting with bilirubinuria  and lower extremity swelling. Although complaining of unilateral pain and swelling, on exam appears symmetrically swollen. Bilirubinuria most likely chronic liver injury from hx of significant alchol use but currently vitals are stable and has no acute altered mental status. Will discharge with strong recommendation to stop alcohol use and f/u with outpatient DVT US and follow up with PCP.  ED Discharge Orders         Ordered    LE VENOUS     11/03/18 2008           Mosetta Anis, MD 11/03/18 2043    Mosetta Anis, MD 11/03/18 2045    Mosetta Anis, MD 11/03/18 2054    Lajean Saver, MD 11/03/18 2225

## 2018-11-04 ENCOUNTER — Encounter (HOSPITAL_COMMUNITY): Payer: Self-pay | Admitting: *Deleted

## 2018-11-04 ENCOUNTER — Other Ambulatory Visit: Payer: Self-pay

## 2018-11-04 ENCOUNTER — Ambulatory Visit (HOSPITAL_COMMUNITY): Admission: RE | Admit: 2018-11-04 | Payer: Medicaid Other | Source: Ambulatory Visit

## 2018-11-04 ENCOUNTER — Emergency Department (HOSPITAL_COMMUNITY)
Admission: EM | Admit: 2018-11-04 | Discharge: 2018-11-04 | Disposition: A | Discharge status: Home / Self Care | Payer: Medicaid Other | Attending: Emergency Medicine | Admitting: Emergency Medicine

## 2018-11-04 ENCOUNTER — Emergency Department (HOSPITAL_BASED_OUTPATIENT_CLINIC_OR_DEPARTMENT_OTHER): Payer: Medicaid Other

## 2018-11-04 ENCOUNTER — Emergency Department (HOSPITAL_COMMUNITY): Payer: Medicaid Other

## 2018-11-04 DIAGNOSIS — W19XXXA Unspecified fall, initial encounter: Secondary | ICD-10-CM | POA: Diagnosis not present

## 2018-11-04 DIAGNOSIS — M25532 Pain in left wrist: Secondary | ICD-10-CM | POA: Diagnosis not present

## 2018-11-04 DIAGNOSIS — W1830XA Fall on same level, unspecified, initial encounter: Secondary | ICD-10-CM | POA: Insufficient documentation

## 2018-11-04 DIAGNOSIS — Y929 Unspecified place or not applicable: Secondary | ICD-10-CM | POA: Insufficient documentation

## 2018-11-04 DIAGNOSIS — R600 Localized edema: Secondary | ICD-10-CM | POA: Insufficient documentation

## 2018-11-04 DIAGNOSIS — M25572 Pain in left ankle and joints of left foot: Secondary | ICD-10-CM | POA: Diagnosis not present

## 2018-11-04 DIAGNOSIS — R52 Pain, unspecified: Secondary | ICD-10-CM | POA: Diagnosis not present

## 2018-11-04 DIAGNOSIS — S79912A Unspecified injury of left hip, initial encounter: Secondary | ICD-10-CM | POA: Diagnosis not present

## 2018-11-04 DIAGNOSIS — F1010 Alcohol abuse, uncomplicated: Secondary | ICD-10-CM | POA: Insufficient documentation

## 2018-11-04 DIAGNOSIS — I10 Essential (primary) hypertension: Secondary | ICD-10-CM | POA: Insufficient documentation

## 2018-11-04 DIAGNOSIS — M7989 Other specified soft tissue disorders: Secondary | ICD-10-CM | POA: Diagnosis not present

## 2018-11-04 DIAGNOSIS — Y999 Unspecified external cause status: Secondary | ICD-10-CM | POA: Insufficient documentation

## 2018-11-04 DIAGNOSIS — M79605 Pain in left leg: Secondary | ICD-10-CM | POA: Diagnosis not present

## 2018-11-04 DIAGNOSIS — M25552 Pain in left hip: Secondary | ICD-10-CM | POA: Insufficient documentation

## 2018-11-04 DIAGNOSIS — Z043 Encounter for examination and observation following other accident: Secondary | ICD-10-CM | POA: Diagnosis present

## 2018-11-04 DIAGNOSIS — M79642 Pain in left hand: Secondary | ICD-10-CM | POA: Diagnosis not present

## 2018-11-04 DIAGNOSIS — Y9301 Activity, walking, marching and hiking: Secondary | ICD-10-CM | POA: Insufficient documentation

## 2018-11-04 DIAGNOSIS — R609 Edema, unspecified: Secondary | ICD-10-CM | POA: Diagnosis not present

## 2018-11-04 DIAGNOSIS — S99912A Unspecified injury of left ankle, initial encounter: Secondary | ICD-10-CM | POA: Diagnosis not present

## 2018-11-04 DIAGNOSIS — S6992XA Unspecified injury of left wrist, hand and finger(s), initial encounter: Secondary | ICD-10-CM | POA: Diagnosis not present

## 2018-11-04 IMAGING — DX DG WRIST COMPLETE 3+V*L*
4 series · 4 of 4 positions shown · non-contrast
Comparison: None.

CLINICAL DATA: Recent fall with left wrist pain, initial encounter

EXAM:
LEFT WRIST - COMPLETE 3+ VIEW

[wrist pa]
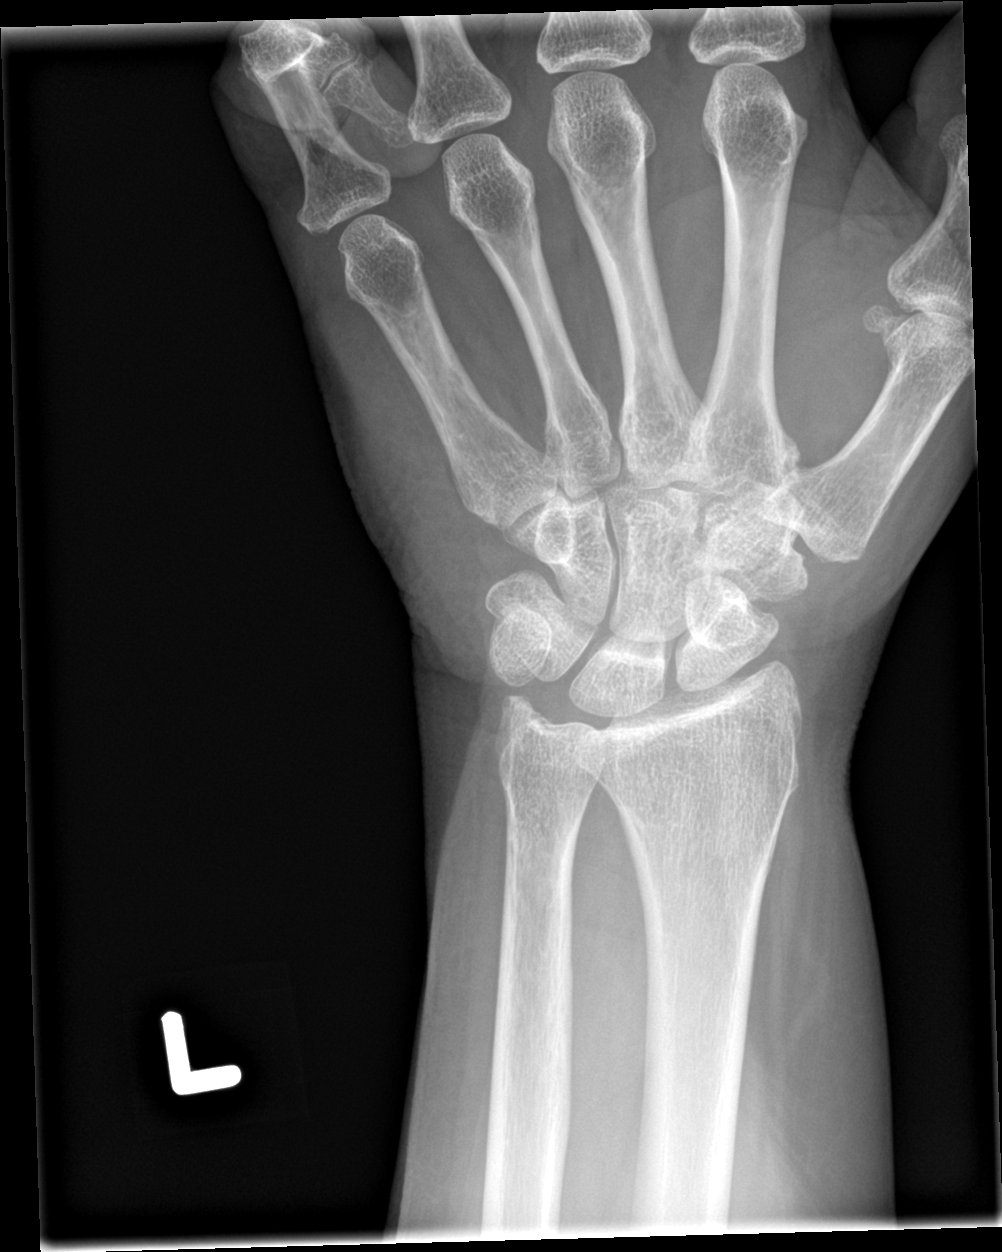

[wrist obl]
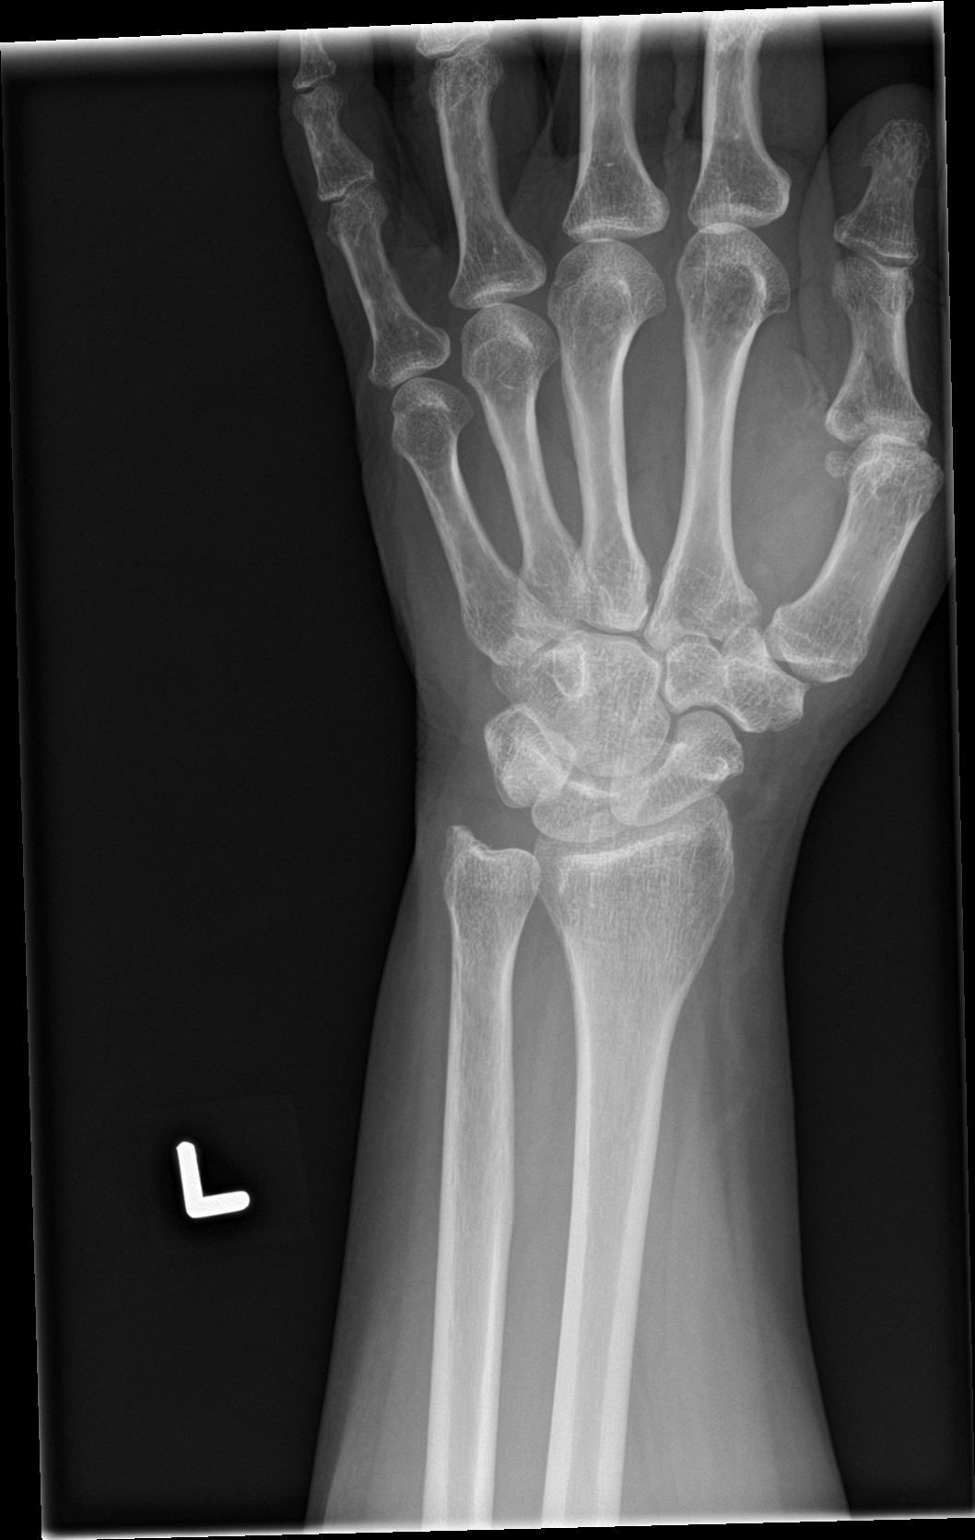

[wrist lat]
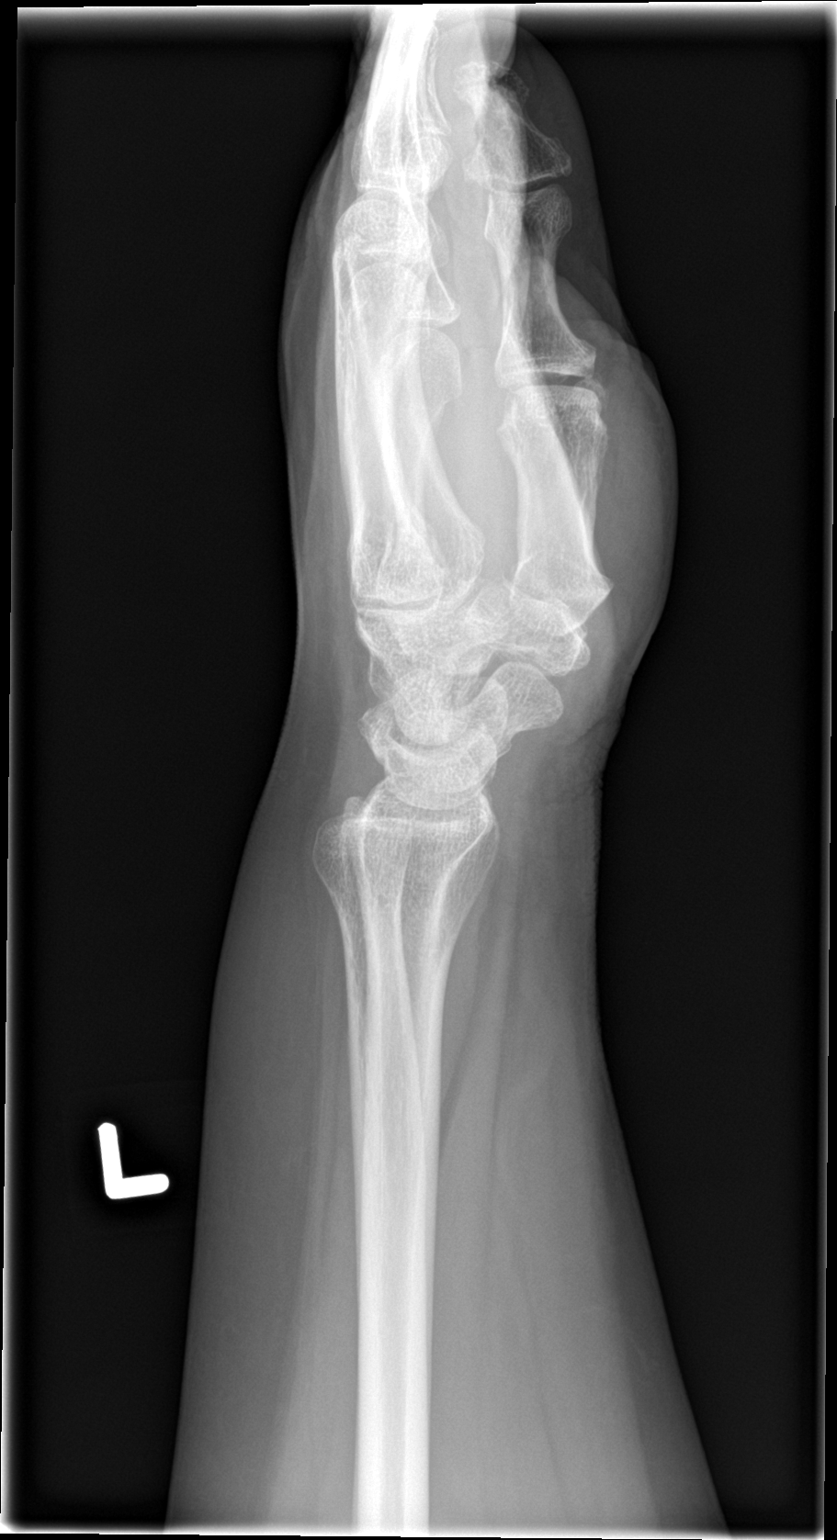

[wrist navicular]
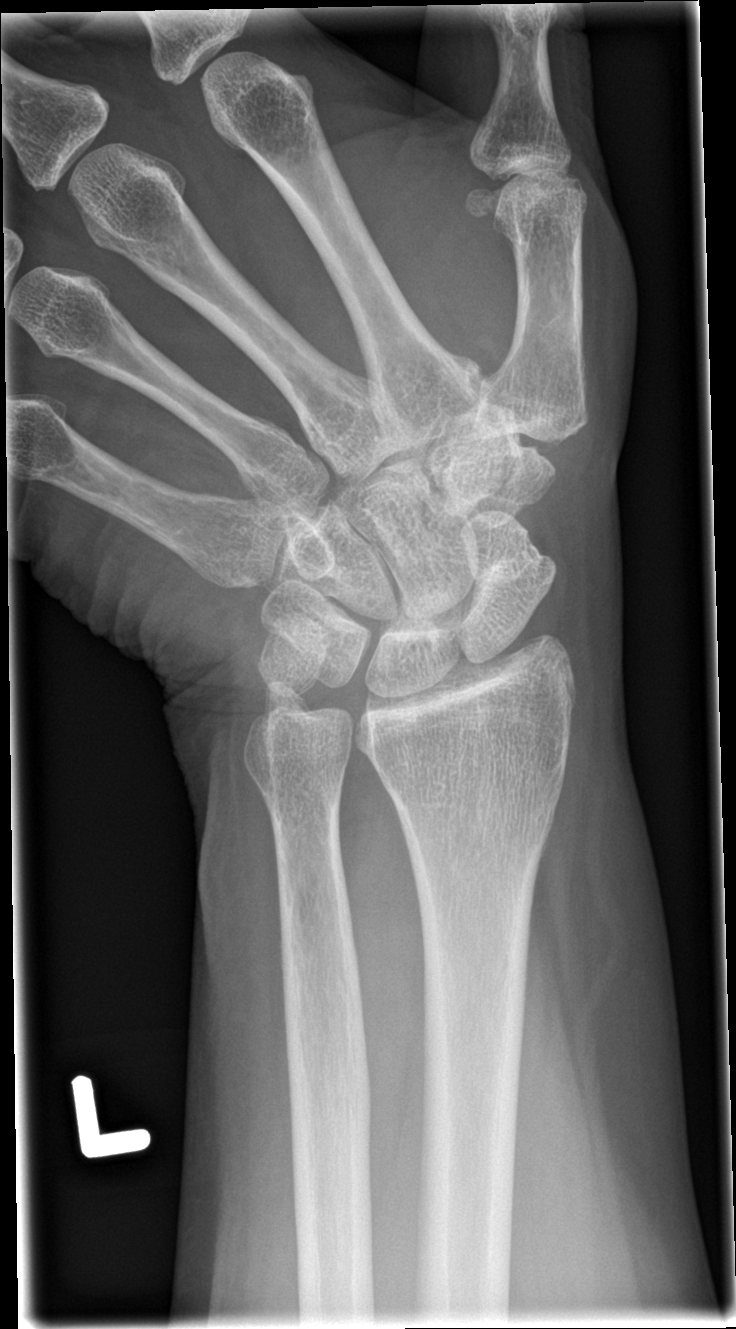

[4 of 4 positions shown; findings below may reference images not displayed]

FINDINGS: There is no evidence of fracture or dislocation. There is no
evidence of arthropathy or other focal bone abnormality. Soft
tissues are unremarkable.
IMPRESSION: No acute abnormality noted.

## 2018-11-04 IMAGING — DX DG HIP (WITH OR WITHOUT PELVIS) 2-3V*L*
4 series · 4 of 4 positions shown · non-contrast
Comparison: None.

CLINICAL DATA: Recent fall with left hip pain, initial encounter

EXAM:
DG HIP (WITH OR WITHOUT PELVIS) 3V LEFT

[pelvis ap]
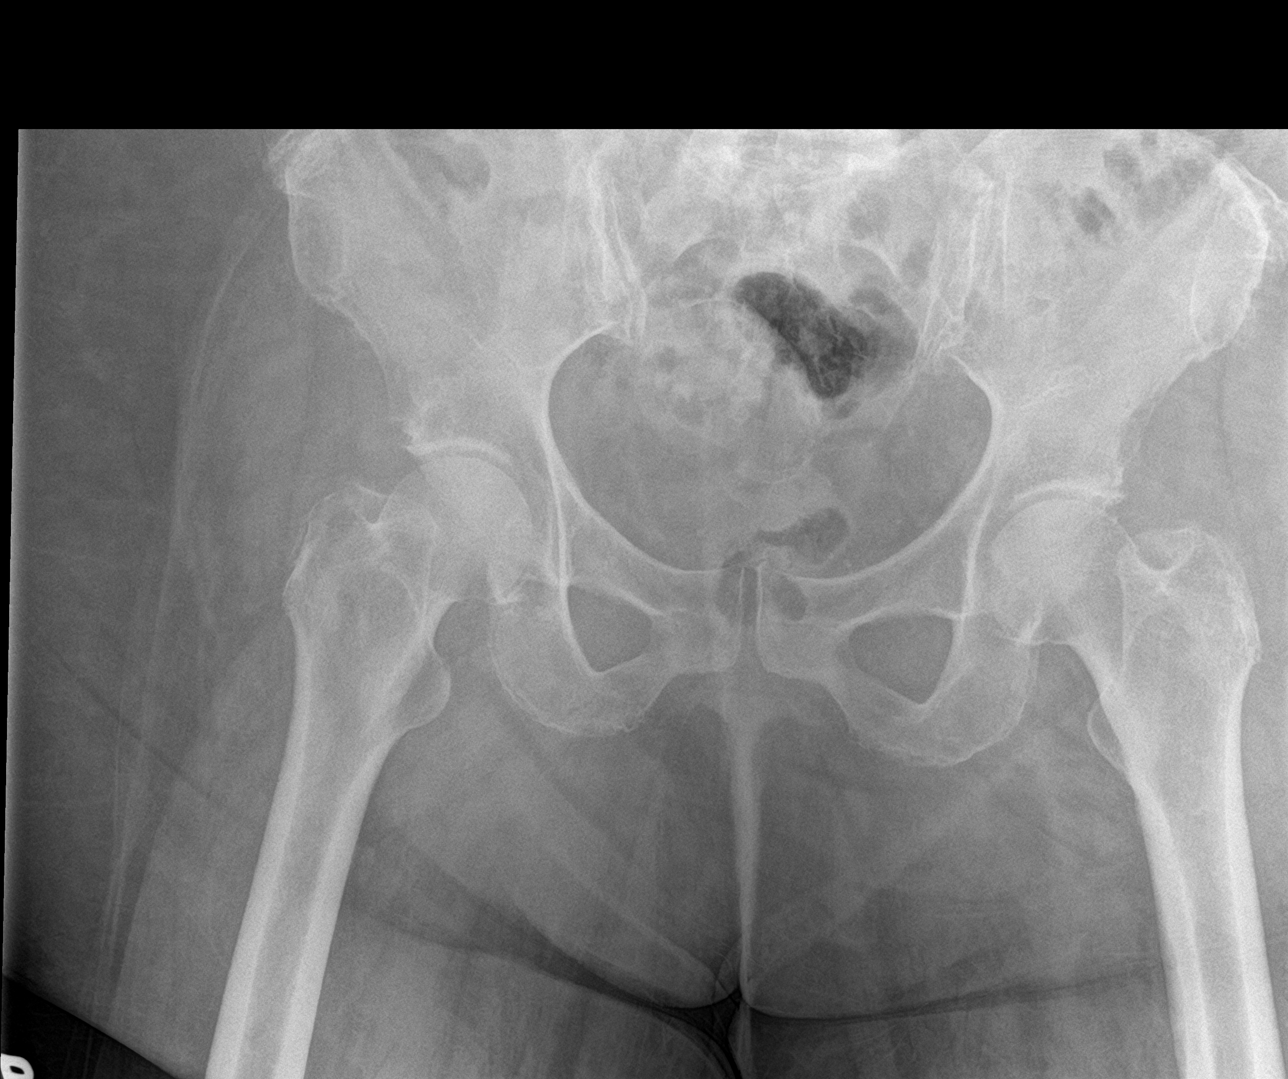

[hip ap (1 of 2)]
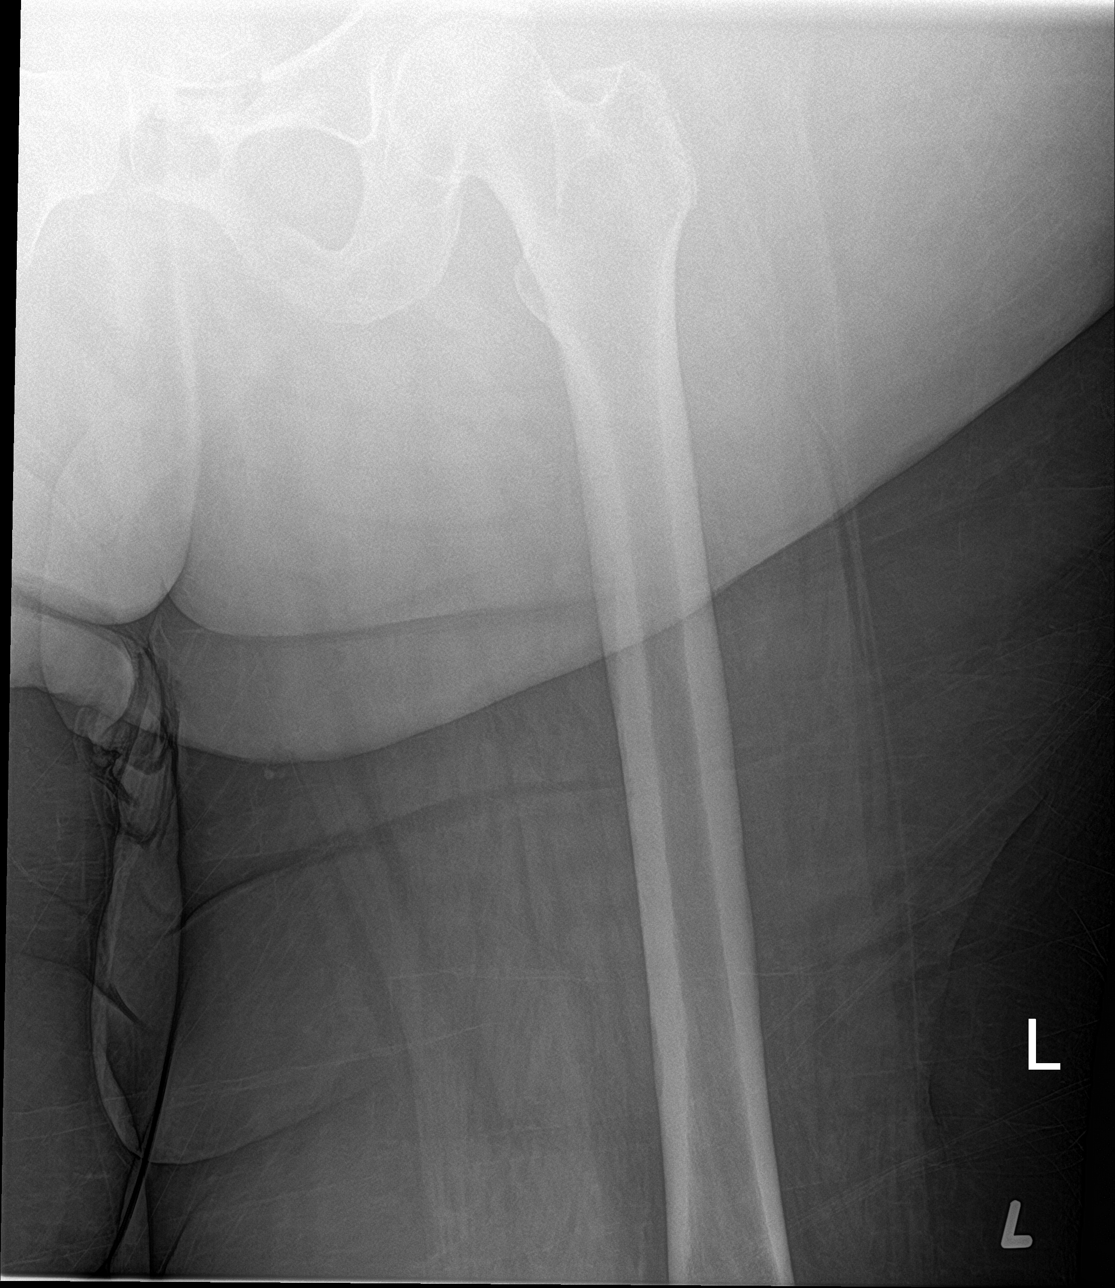

[hip lat]
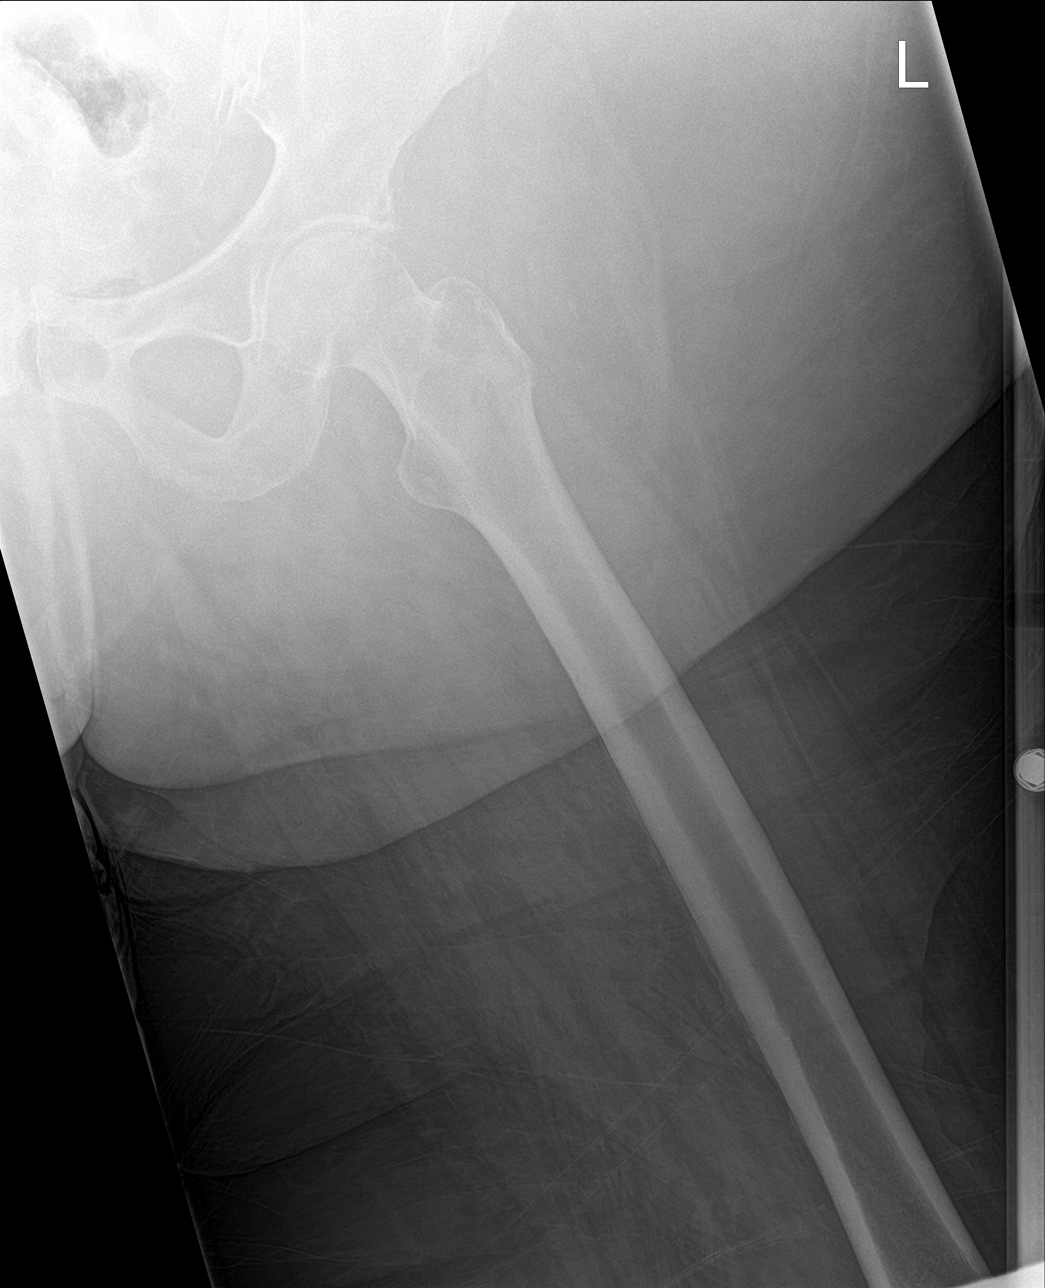

[hip ap (2 of 2)]
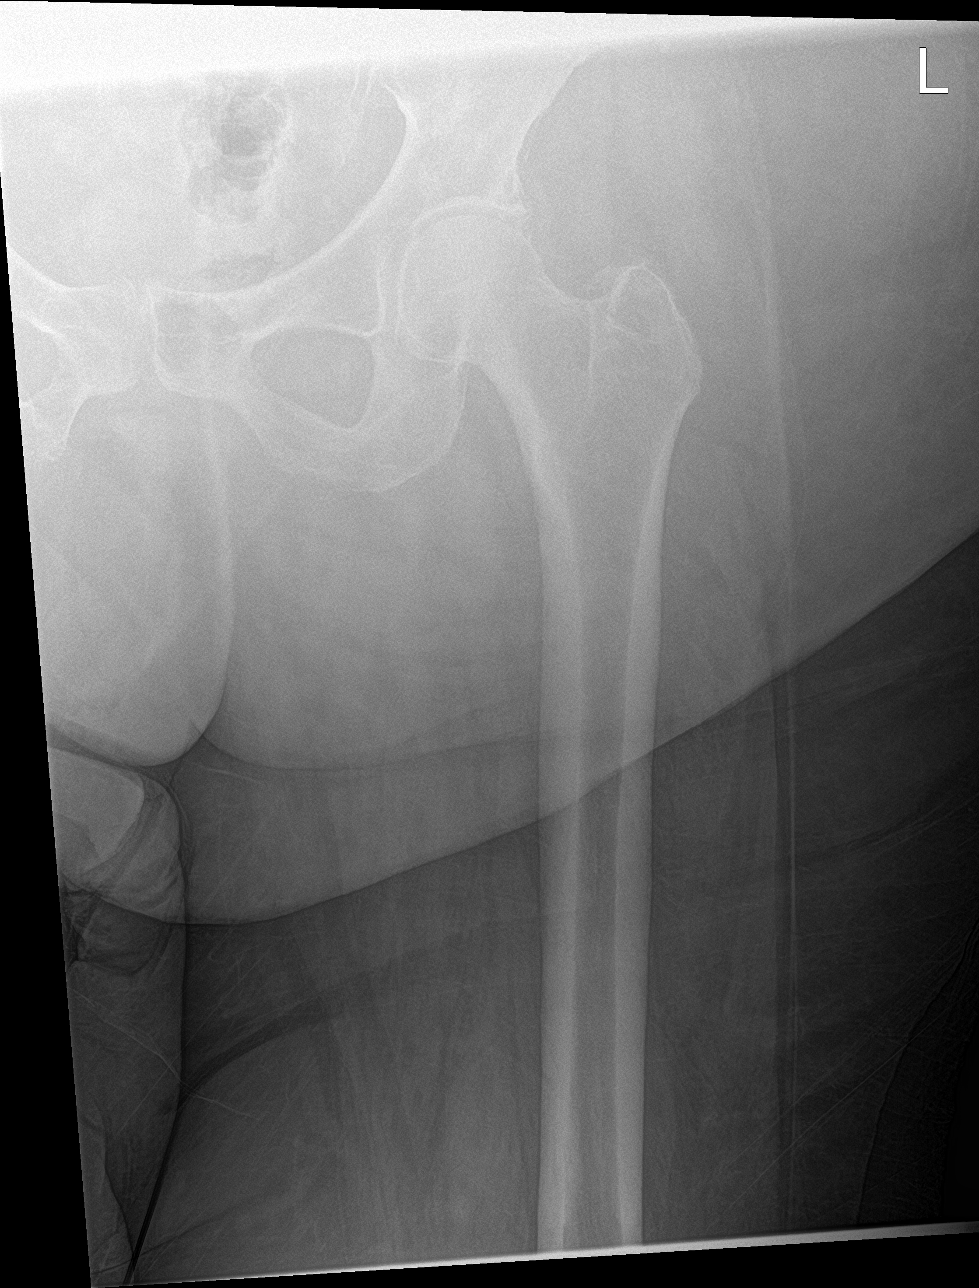

[4 of 4 positions shown; findings below may reference images not displayed]

FINDINGS: Pelvic ring is intact. Mild degenerative changes of the hip joints
are noted bilaterally. No acute fracture or dislocation is noted. No
soft tissue abnormality is seen.
IMPRESSION: No acute abnormality noted.

## 2018-11-04 IMAGING — DX DG ANKLE COMPLETE 3+V*L*
3 series · 3 of 3 positions shown · non-contrast
Comparison: None.

CLINICAL DATA: Recent fall left ankle pain, initial encounter

EXAM:
LEFT ANKLE COMPLETE - 3+ VIEW

[ankle ap]
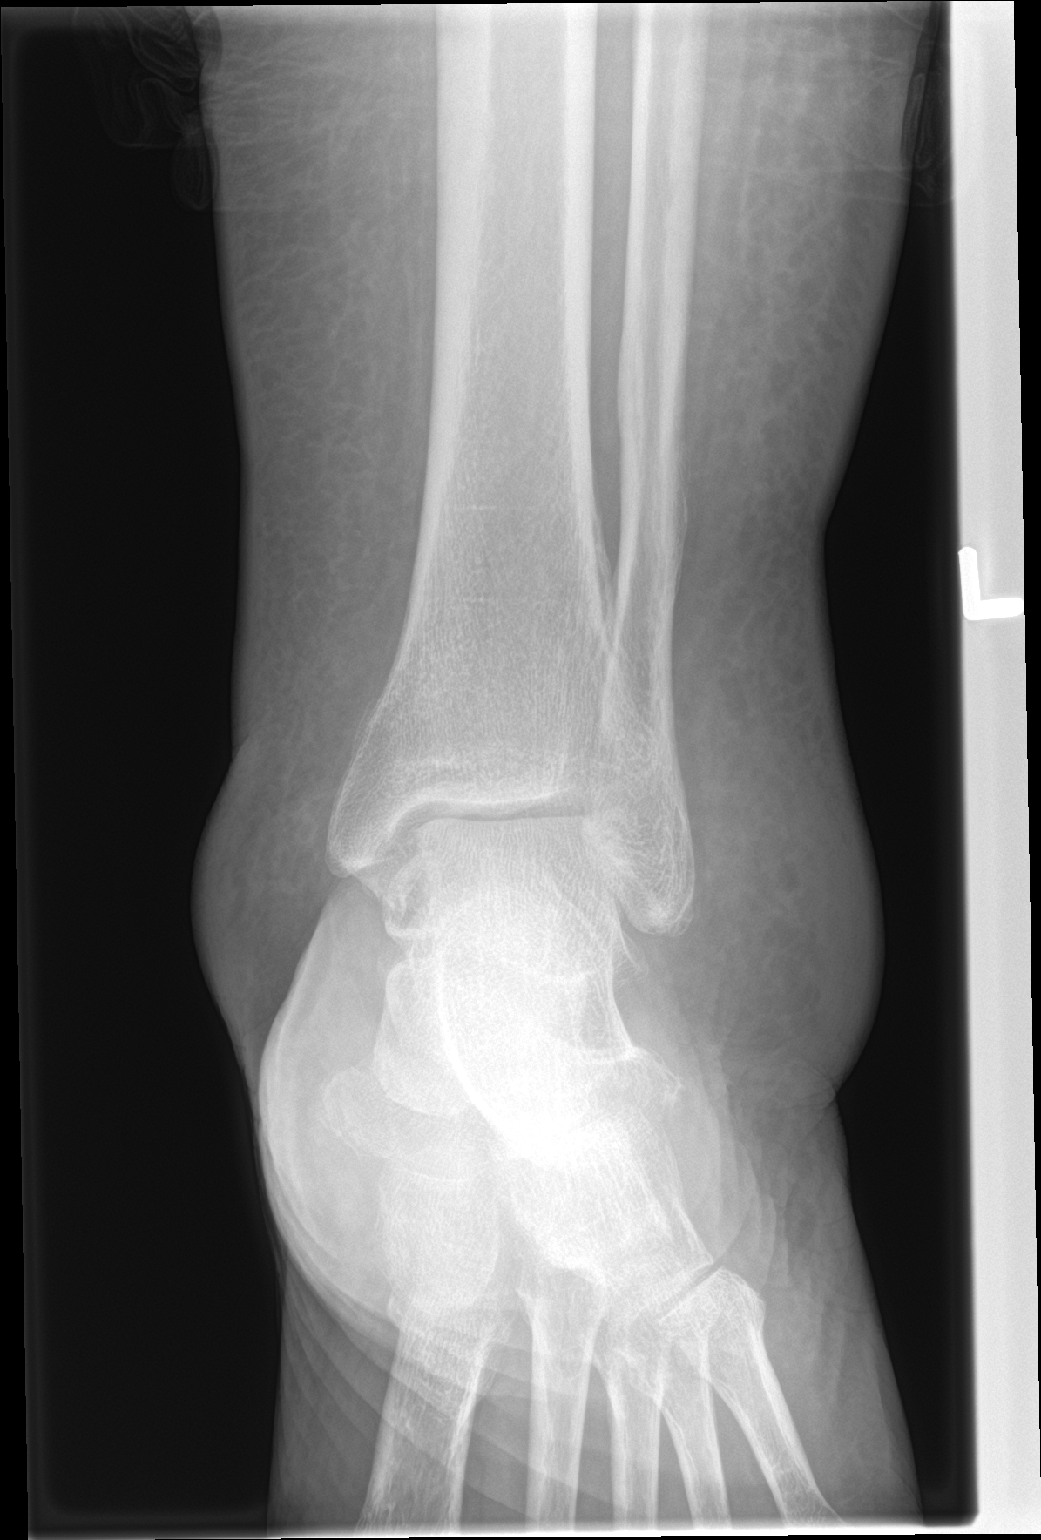

[ankle obl]
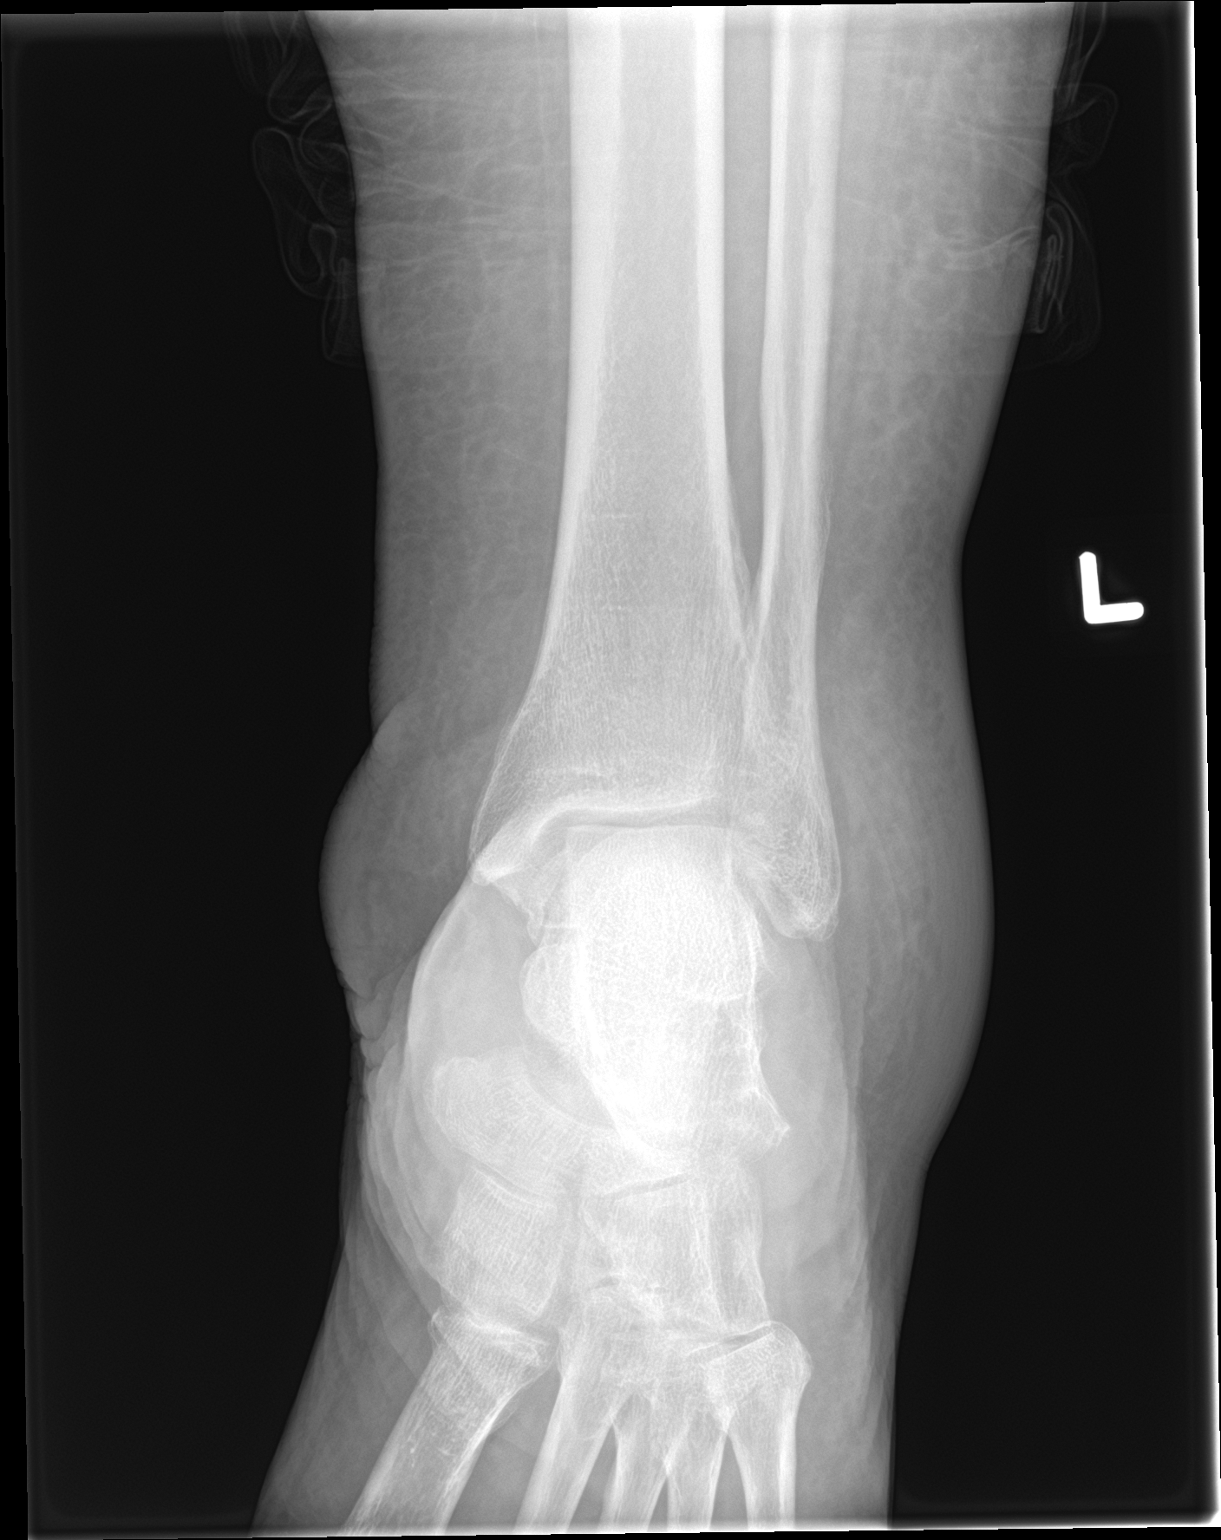

[ankle lat]
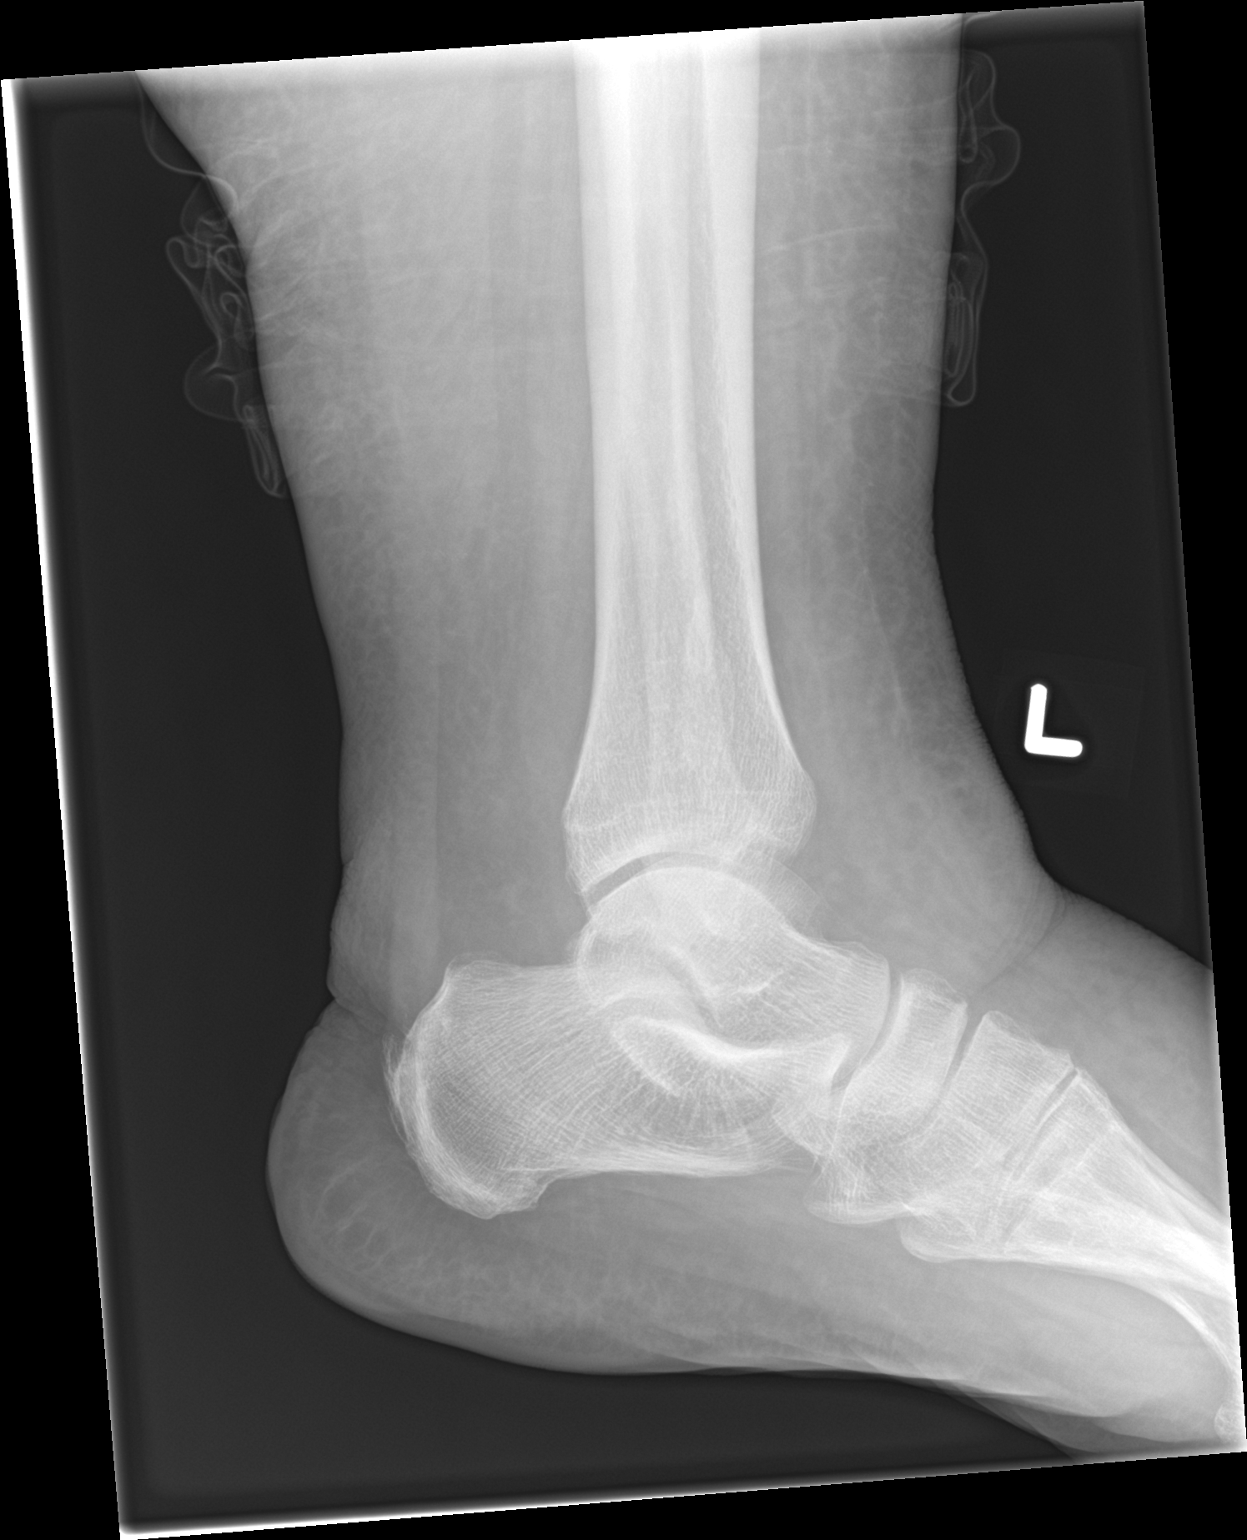

[3 of 3 positions shown; findings below may reference images not displayed]

FINDINGS: Considerable soft tissue swelling about the ankle joint is noted. No
acute fracture or dislocation is noted. Calcaneal spurring is noted.
Tarsal degenerative changes are noted.
IMPRESSION: Generalized soft tissue swelling without acute bony abnormality.

## 2018-11-04 NOTE — ED Notes (Signed)
Patient transported to Ultrasound 

## 2018-11-04 NOTE — ED Provider Notes (Signed)
Emergency Department Provider Note   I have reviewed the triage vital signs and the nursing notes.   HISTORY  Chief Complaint Fall   HPI [name] is a 63 y.o. female with PMH of HTN, Alcohol abuse, elevated BMI, and hyperbilirubinemia presents to the emergency department for evaluation after mechanical fall.  The patient was seen in the emergency department yesterday with lower leg swelling and pain worse on the left.  At that time, she was found to have elevated bilirubin and LFTs which have been present in the past but seem to be worsening.  Patient is not having abdominal pain, fever, and continues to drink alcohol.  She was given resources and discharged home with instructions to return today for DVT ultrasound on the left.  Patient states that she called a cab and was walking out to get in the car when she felt like her left foot "gave out."  She fell to the ground without head injury but was unable to get up without assistance.  She estimates that she lay on the ground for approximately 10 minutes and then received help.  EMS arrived and transported her to the emergency department.  Patient is experiencing pain in the left ankle, hip, hand.  She denies any chest pain, heart palpitations, shortness of breath.  No headache or neck pain.  No lower back pain.    Past Medical History:  Diagnosis Date  . Diverticulitis   . Hypertension     Patient Active Problem List   Diagnosis Date Noted  . Alcohol-induced mood disorder ([place]) 11/01/2013  . Alcohol dependence ([place]) 11/01/2013  . Alcohol withdrawal ([place]) 10/31/2013  . HYPERTENSION 06/20/2007  . LOW BACK PAIN 06/20/2007  . OBESITY 05/05/2007  . DIVERTICULOSIS, COLON 05/05/2007    Past Surgical History:  Procedure Laterality Date  . TUBAL LIGATION     Allergies Patient has no known allergies.  History reviewed. No pertinent family history.  Social History Social History   Tobacco Use  . Smoking status: Never  Smoker  . Smokeless tobacco: Never Used  Substance Use Topics  . Alcohol use: Yes    Alcohol/week: 10.0 standard drinks    Types: 10 Shots of liquor per week  . Drug use: No    Review of Systems  Constitutional: No fever/chills Eyes: No visual changes. ENT: No sore throat. Cardiovascular: Denies chest pain. Respiratory: Denies shortness of breath. Gastrointestinal: No abdominal pain.  No nausea, no vomiting.  No diarrhea.  No constipation. Genitourinary: Negative for dysuria. Musculoskeletal: Negative for back pain. Positive left ankle, hip, and left hand pain.  Skin: Negative for rash. Neurological: Negative for headaches, focal weakness or numbness.  10-point ROS otherwise negative.  ____________________________________________   PHYSICAL EXAM:  VITAL SIGNS: ED Triage Vitals  Enc Vitals Group     BP 11/04/18 0754 (!) 155/82     Pulse Rate 11/04/18 0754 100     Resp 11/04/18 0754 16     Temp 11/04/18 0754 97.9 F (36.6 C)     Temp Source 11/04/18 0754 Oral     SpO2 11/04/18 0754 100 %     Weight 11/04/18 0806 285 lb (129.3 kg)     Height 11/04/18 0806 5\' 1"  (1.549 m)     Pain Score 11/04/18 0806 8   Constitutional: Alert and oriented. Well appearing and in no acute distress. Eyes: Conjunctivae are normal. Sclera are slightly icteric.  Head: Atraumatic. Nose: No congestion/rhinnorhea. Mouth/Throat: Mucous membranes are moist. Neck: No stridor.  Cardiovascular: Normal rate, regular rhythm. Good peripheral circulation. Grossly normal heart sounds.   Respiratory: Normal respiratory effort.  No retractions. Lungs CTAB. Gastrointestinal: Soft and nontender. No distention.  Musculoskeletal: Bilateral LE edema slightly worse on the left. Normal passive ROM of the left hip, ankle, and knee. No abrasions or laceration. No gross deformities of extremities. Normal ROM of the left shoulder, elbow, and wrist. No scaphoid tenderness.  Neurologic:  Normal speech and language. No  gross focal neurologic deficits are appreciated.  Skin:  Skin is warm, dry and intact. No rash noted.  ____________________________________________  RADIOLOGY  Dg Wrist Complete Left  Result Date: 11/04/2018 CLINICAL DATA:  Recent fall with left wrist pain, initial encounter EXAM: LEFT WRIST - COMPLETE 3+ VIEW COMPARISON:  None. FINDINGS: There is no evidence of fracture or dislocation. There is no evidence of arthropathy or other focal bone abnormality. Soft tissues are unremarkable. IMPRESSION: No acute abnormality noted. Electronically Signed   By: Inez Catalina M.D.   On: 11/04/2018 09:22   Dg Ankle Complete Left  Result Date: 11/04/2018 CLINICAL DATA:  Recent fall left ankle pain, initial encounter EXAM: LEFT ANKLE COMPLETE - 3+ VIEW COMPARISON:  None. FINDINGS: Considerable soft tissue swelling about the ankle joint is noted. No acute fracture or dislocation is noted. Calcaneal spurring is noted. Tarsal degenerative changes are noted. IMPRESSION: Generalized soft tissue swelling without acute bony abnormality. Electronically Signed   By: Inez Catalina M.D.   On: 11/04/2018 09:21   Dg Hip Unilat W Or Wo Pelvis 2-3 Views Left  Result Date: 11/04/2018 CLINICAL DATA:  Recent fall with left hip pain, initial encounter EXAM: DG HIP (WITH OR WITHOUT PELVIS) 3V LEFT COMPARISON:  None. FINDINGS: Pelvic ring is intact. Mild degenerative changes of the hip joints are noted bilaterally. No acute fracture or dislocation is noted. No soft tissue abnormality is seen. IMPRESSION: No acute abnormality noted. Electronically Signed   By: Inez Catalina M.D.   On: 11/04/2018 09:20   Vas [place] Lower Extremity Venous (dvt) (mc And Wl 7a-7p)  Result Date: 11/04/2018  Lower Venous Study Indications: Pain, and Edema.  Limitations: Body habitus. Comparison Study: No prior study on file for comparison Performing Technologist: Sharion Dove RVS  Examination Guidelines: A complete evaluation includes B-mode imaging, spectral  Doppler, color Doppler, and power Doppler as needed of all accessible portions of each vessel. Bilateral testing is considered an integral part of a complete examination. Limited examinations for reoccurring indications may be performed as noted.  Right Venous Findings: +---+---------------+---------+-----------+----------+-------+    CompressibilityPhasicitySpontaneityPropertiesSummary +---+---------------+---------+-----------+----------+-------+ CFVFull           Yes      Yes                          +---+---------------+---------+-----------+----------+-------+  Left Venous Findings: +---------+---------------+---------+-----------+----------+-------+          CompressibilityPhasicitySpontaneityPropertiesSummary +---------+---------------+---------+-----------+----------+-------+ CFV      Full           Yes      Yes                          +---------+---------------+---------+-----------+----------+-------+ SFJ      Full                                                 +---------+---------------+---------+-----------+----------+-------+  FV Prox  Full                                                 +---------+---------------+---------+-----------+----------+-------+ FV Mid   Full                                                 +---------+---------------+---------+-----------+----------+-------+ FV DistalFull                                                 +---------+---------------+---------+-----------+----------+-------+ POP      Full           Yes      Yes                          +---------+---------------+---------+-----------+----------+-------+ PTV      Full                                                 +---------+---------------+---------+-----------+----------+-------+  Left Technical Findings: Not visualized segments include profunda and peroneal veins.   Summary: Right: No evidence of common femoral vein obstruction. Left: There is  no evidence of deep vein thrombosis in the lower extremity. However, portions of this examination were limited- see technologist comments above.  *See table(s) above for measurements and observations. Electronically signed by Monica Martinez MD on 11/04/2018 at 11:07:07 AM.    Final     ____________________________________________   PROCEDURES  Procedure(s) performed:   Procedures  None  ____________________________________________   INITIAL IMPRESSION / ASSESSMENT AND PLAN / ED COURSE  Pertinent labs & imaging results that were available during my care of the patient were reviewed by me and considered in my medical decision making (see chart for details).  Patient presents to the emergency department for evaluation after mechanical fall.  She denies new or worsening symptoms from when she was seen in the emergency department yesterday.  Plan for DVT ultrasound as was scheduled by prior ED team to rule out DVT in the left lower extremity.  Have called radiology to make them aware that the patient is here in the emergency department.  I also plan to obtain plain films of the left hip, ankle, hand to rule out bony injury.  Review of labs yesterday show elevated LFTs and bilirubin.  Patient is well-appearing with normal mental status.  No abdominal tenderness.  No interval change in symptoms.  I do not plan to repeat lab work today.  I again discussed the patient's labs and that this is likely related to alcohol abuse.  The patient states she was given resources yesterday and plans to follow-up today regarding outpatient treatment for alcohol abuse.   Plain films and DVT ultrasound unremarkable.  Patient with improved pain here.  Provided a DME prescription for walker to use at home.  Patient lives with family and has assistance there.  She is comfortable with the plan for discharge.  Discussed alcohol  treatment in the community.  She received resources yesterday plans to follow-up on Monday.   Discussed need for repeat LFTs given elevation on yesterday's labs.  Patient understands. ____________________________________________  FINAL CLINICAL IMPRESSION(S) / ED DIAGNOSES  Final diagnoses:  Fall, initial encounter  Left leg pain  Left leg swelling    Note:  This document was prepared using Dragon voice recognition software and may include unintentional dictation errors.  Nanda Quinton, MD Emergency Medicine    Long, [facility], MD 11/04/18 [phone]

## 2018-11-04 NOTE — ED Triage Notes (Signed)
Pt was coming to Hospital for a venous doppler study this AM for bil. Leg pain. Pt reported her Lt foot gave out and she fell onto concrete  Side walk. Pt now has Lt hip pain . Pt still has Bil. Leg pain.

## 2018-11-04 NOTE — Discharge Instructions (Signed)

## 2018-11-04 NOTE — Progress Notes (Signed)
VASCULAR LAB PRELIMINARY  PRELIMINARY  PRELIMINARY  PRELIMINARY  Left lower extremity venous duplex completed.    Preliminary report:  See preliminary in CV Proc  Called results to Dr. Phill Myron, [facility], RVT 11/04/2018, 10:23 AM

## 2018-11-04 NOTE — ED Triage Notes (Signed)
Per [facility] pt coming from home after having a fall while walking. Reports losing her balance. Pt was seen here yesterday with bilateral leg pain and edema.

## 2018-11-04 NOTE — ED Notes (Signed)
Patient transported to X-ray 

## 2018-11-20 ENCOUNTER — Encounter: Payer: Self-pay | Admitting: Pulmonary Disease

## 2018-11-20 ENCOUNTER — Ambulatory Visit: Payer: Medicaid Other | Attending: Pulmonary Disease | Admitting: Pulmonary Disease

## 2018-11-20 ENCOUNTER — Other Ambulatory Visit: Payer: Self-pay

## 2018-11-20 VITALS — BP 159/99 | HR 102 | Temp 97.8°F | Wt 303.4 lb

## 2018-11-20 DIAGNOSIS — R011 Cardiac murmur, unspecified: Secondary | ICD-10-CM | POA: Diagnosis not present

## 2018-11-20 DIAGNOSIS — R609 Edema, unspecified: Secondary | ICD-10-CM | POA: Insufficient documentation

## 2018-11-20 DIAGNOSIS — I10 Essential (primary) hypertension: Secondary | ICD-10-CM

## 2018-11-20 DIAGNOSIS — F1010 Alcohol abuse, uncomplicated: Secondary | ICD-10-CM | POA: Insufficient documentation

## 2018-11-20 DIAGNOSIS — Z6841 Body Mass Index (BMI) 40.0 and over, adult: Secondary | ICD-10-CM

## 2018-11-20 DIAGNOSIS — K7010 Alcoholic hepatitis without ascites: Secondary | ICD-10-CM

## 2018-11-20 MED ORDER — FUROSEMIDE 40 MG PO TABS: 40.0000 mg | ORAL_TABLET | Freq: Every day | ORAL | 6 refills | Status: DC

## 2018-11-20 MED ORDER — METOPROLOL TARTRATE 50 MG PO TABS: 50.0000 mg | ORAL_TABLET | Freq: Two times a day (BID) | ORAL | 6 refills | Status: DC

## 2018-11-20 MED ORDER — POTASSIUM CHLORIDE CRYS ER 20 MEQ PO TBCR
20.0000 meq | EXTENDED_RELEASE_TABLET | Freq: Every day | ORAL | 3 refills | Status: DC
Start: 2018-11-20 — End: 2019-01-10

## 2018-11-20 NOTE — Progress Notes (Signed)
Patient has not been taking any medications.

## 2018-11-20 NOTE — Patient Instructions (Addendum)
[name]-  It was a pleasure meeting you today...  Together I think we can really help you improve your helath!  1st - for YOU to do:    Stop all the alcohol- it has damaged your liver & contributed to your weight problem and leg swelling...    Eliminate all the salt/ sodium from your diet- this is necessary to help decrease your swelling...    Start on a diet plan- we discussed an intermittent FASTING diet (nothing from dinner one night to a light lunch the next day as e discussed)...    You can wrap your legs with ACE bandage to help minimize the swelling when you are up and about...  2nd - for [place] to check:    Today we did some follow up blood work...    We will arrange for a baseline EKG and an Echocardiogram of your heart to check the valve...    We will also check an Ultrasound of your Abdomen to visualize the liver and check for fluid & cirrhosis...    We will contact you w/ the results when available...   3rd - let's talk about MEDICATION:    I am recommending that you start METOPROLOL 50mg  one tab twice daily for your BP & heart...    Also start LASIX (Furosemide) 40mg  one tab every morning for your swelling...    And a POTASSIUM pill KCl 29mEq one tab daily in the morning w/ the Furosemide...  Finally we discussed checking you monthly to be sure we are on track with outrtreatment of your liver disease, blood pressure, heart, and the edema/swelling... call for any questions.     DASH Eating Plan DASH stands for "Dietary Approaches to Stop Hypertension." The DASH eating plan is a healthy eating plan that has been shown to reduce high blood pressure (hypertension). It may also reduce your risk for type 2 diabetes, heart disease, and stroke. The DASH eating plan may also help with weight loss. What are tips for following this plan?  General guidelines  Avoid eating more than 2,300 mg (milligrams) of salt (sodium) a day. If you have hypertension, you may need to reduce your  sodium intake to 1,500 mg a day.  Limit alcohol intake to no more than 1 drink a day for nonpregnant women and 2 drinks a day for men. One drink equals 12 oz of beer, 5 oz of wine, or 1 oz of hard liquor.  Work with your health care provider to maintain a healthy body weight or to lose weight. Ask what an ideal weight is for you.  Get at least 30 minutes of exercise that causes your heart to beat faster (aerobic exercise) most days of the week. Activities may include walking, swimming, or biking.  Work with your health care provider or diet and nutrition specialist (dietitian) to adjust your eating plan to your individual calorie needs. Reading food labels   Check food labels for the amount of sodium per serving. Choose foods with less than 5 percent of the Daily Value of sodium. Generally, foods with less than 300 mg of sodium per serving fit into this eating plan.  To find whole grains, look for the word "whole" as the first word in the ingredient list. Shopping  Buy products labeled as "low-sodium" or "no salt added."  Buy fresh foods. Avoid canned foods and premade or frozen meals. Cooking  Avoid adding salt when cooking. Use salt-free seasonings or herbs instead of table salt or  sea salt. Check with your health care provider or pharmacist before using salt substitutes.  Do not fry foods. Cook foods using healthy methods such as baking, boiling, grilling, and broiling instead.  Cook with heart-healthy oils, such as olive, canola, soybean, or sunflower oil. Meal planning  Eat a balanced diet that includes: ? 5 or more servings of fruits and vegetables each day. At each meal, try to fill half of your plate with fruits and vegetables. ? Up to 6-8 servings of whole grains each day. ? Less than 6 oz of lean meat, poultry, or fish each day. A 3-oz serving of meat is about the same size as a deck of cards. One egg equals 1 oz. ? 2 servings of low-fat dairy each day. ? A serving of  nuts, seeds, or beans 5 times each week. ? Heart-healthy fats. Healthy fats called Omega-3 fatty acids are found in foods such as flaxseeds and coldwater fish, like sardines, salmon, and mackerel.  Limit how much you eat of the following: ? Canned or prepackaged foods. ? Food that is high in trans fat, such as fried foods. ? Food that is high in saturated fat, such as fatty meat. ? Sweets, desserts, sugary drinks, and other foods with added sugar. ? Full-fat dairy products.  Do not salt foods before eating.  Try to eat at least 2 vegetarian meals each week.  Eat more home-cooked food and less restaurant, buffet, and fast food.  When eating at a restaurant, ask that your food be prepared with less salt or no salt, if possible. What foods are recommended? The items listed may not be a complete list. Talk with your dietitian about what dietary choices are best for you. Grains Whole-grain or whole-wheat bread. Whole-grain or whole-wheat pasta. Brown rice. [place]row. Bulgur. Whole-grain and low-sodium cereals. Pita bread. Low-fat, low-sodium crackers. Whole-wheat flour tortillas. Vegetables Fresh or frozen vegetables (raw, steamed, roasted, or grilled). Low-sodium or reduced-sodium tomato and vegetable juice. Low-sodium or reduced-sodium tomato sauce and tomato paste. Low-sodium or reduced-sodium canned vegetables. Fruits All fresh, dried, or frozen fruit. Canned fruit in natural juice (without added sugar). Meat and other protein foods Skinless chicken or [place]. Ground chicken or [place]. Pork with fat trimmed off. Fish and seafood. Egg whites. Dried beans, peas, or lentils. Unsalted nuts, nut butters, and seeds. Unsalted canned beans. Lean cuts of beef with fat trimmed off. Low-sodium, lean deli meat. Dairy Low-fat (1%) or fat-free (skim) milk. Fat-free, low-fat, or reduced-fat cheeses. Nonfat, low-sodium ricotta or cottage cheese. Low-fat or nonfat yogurt. Low-fat, low-sodium  cheese. Fats and oils Soft margarine without trans fats. Vegetable oil. Low-fat, reduced-fat, or light mayonnaise and salad dressings (reduced-sodium). Canola, safflower, olive, soybean, and sunflower oils. Avocado. Seasoning and other foods Herbs. Spices. Seasoning mixes without salt. Unsalted popcorn and pretzels. Fat-free sweets. What foods are not recommended? The items listed may not be a complete list. Talk with your dietitian about what dietary choices are best for you. Grains Baked goods made with fat, such as croissants, muffins, or some breads. Dry pasta or rice meal packs. Vegetables Creamed or fried vegetables. Vegetables in a cheese sauce. Regular canned vegetables (not low-sodium or reduced-sodium). Regular canned tomato sauce and paste (not low-sodium or reduced-sodium). Regular tomato and vegetable juice (not low-sodium or reduced-sodium). Angie Fava. Olives. Fruits Canned fruit in a light or heavy syrup. Fried fruit. Fruit in cream or butter sauce. Meat and other protein foods Fatty cuts of meat. Ribs. Fried meat. Berniece Salines. Sausage. Bologna and  other processed lunch meats. Salami. Fatback. Hotdogs. Bratwurst. Salted nuts and seeds. Canned beans with added salt. Canned or smoked fish. Whole eggs or egg yolks. Chicken or [place] with skin. Dairy Whole or 2% milk, cream, and half-and-half. Whole or full-fat cream cheese. Whole-fat or sweetened yogurt. Full-fat cheese. Nondairy creamers. Whipped toppings. Processed cheese and cheese spreads. Fats and oils Butter. Stick margarine. Lard. Shortening. Ghee. Bacon fat. Tropical oils, such as coconut, palm kernel, or palm oil. Seasoning and other foods Salted popcorn and pretzels. Onion salt, garlic salt, seasoned salt, table salt, and sea salt. Worcestershire sauce. Tartar sauce. Barbecue sauce. Teriyaki sauce. Soy sauce, including reduced-sodium. Steak sauce. Canned and packaged gravies. Fish sauce. Oyster sauce. Cocktail sauce. Horseradish  that you find on the shelf. Ketchup. Mustard. Meat flavorings and tenderizers. Bouillon cubes. Hot sauce and Tabasco sauce. Premade or packaged marinades. Premade or packaged taco seasonings. Relishes. Regular salad dressings. Where to find more information:  National Heart, Lung, and [place]: https://wilson-eaton.com/  American Heart Association: www.heart.org Summary  The DASH eating plan is a healthy eating plan that has been shown to reduce high blood pressure (hypertension). It may also reduce your risk for type 2 diabetes, heart disease, and stroke.  With the DASH eating plan, you should limit salt (sodium) intake to 2,300 mg a day. If you have hypertension, you may need to reduce your sodium intake to 1,500 mg a day.  When on the DASH eating plan, aim to eat more fresh fruits and vegetables, whole grains, lean proteins, low-fat dairy, and heart-healthy fats.  Work with your health care provider or diet and nutrition specialist (dietitian) to adjust your eating plan to your individual calorie needs. This information is not intended to replace advice given to you by your health care provider. Make sure you discuss any questions you have with your health care provider. Document Released: 08/05/2011 Document Revised: 08/09/2016 Document Reviewed: 08/09/2016 Elsevier Interactive Patient Education  2019 [employer].

## 2018-11-21 ENCOUNTER — Encounter: Payer: Self-pay | Admitting: Pulmonary Disease

## 2018-11-21 DIAGNOSIS — R011 Cardiac murmur, unspecified: Secondary | ICD-10-CM | POA: Insufficient documentation

## 2018-11-21 LAB — COMPREHENSIVE METABOLIC PANEL
ALT: 114 IU/L — ABNORMAL HIGH (ref 0–32)
AST: 176 IU/L — ABNORMAL HIGH (ref 0–40)
Albumin/Globulin Ratio: 0.9 — ABNORMAL LOW (ref 1.2–2.2)
Albumin: 3.5 g/dL — ABNORMAL LOW (ref 3.8–4.8)
Alkaline Phosphatase: 80 IU/L (ref 39–117)
BUN/Creatinine Ratio: 14 (ref 12–28)
BUN: 9 mg/dL (ref 8–27)
Bilirubin Total: 1.6 mg/dL — ABNORMAL HIGH (ref 0.0–1.2)
CO2: 21 mmol/L (ref 20–29)
Calcium: 9.3 mg/dL (ref 8.7–10.3)
Chloride: 102 mmol/L (ref 96–106)
Creatinine, Ser: 0.66 mg/dL (ref 0.57–1.00)
GFR calc Af Amer: 109 mL/min/{1.73_m2} (ref >59)
GFR calc non Af Amer: 94 mL/min/{1.73_m2} (ref >59)
Globulin, Total: 3.9 g/dL (ref 1.5–4.5)
Glucose: 111 mg/dL — ABNORMAL HIGH (ref 65–99)
Potassium: 3.9 mmol/L (ref 3.5–5.2)
Sodium: 139 mmol/L (ref 134–144)
Total Protein: 7.4 g/dL (ref 6.0–8.5)

## 2018-11-21 LAB — TSH: TSH: 3.36 u[IU]/mL (ref 0.450–4.500)

## 2018-11-21 NOTE — Progress Notes (Addendum)
New Patient Office Visit  Subjective:  Patient ID: [name], female    DOB: 11-27-1955  Age: 63 y.o. MRN: [id number]  CC:  Chief Complaint  Patient presents with  . Hospitalization Follow-up    New patient- referred to [facility] by [facility] ER to establish primary care...     HPI [name] is a 63 y/o lady who was referred to [facility] by the [facility] ER to establish primary care- she has mult medical issues as outlined below>     She ws seen in the [facility] ED on 3/6 and 11/04/2018 c/o her urine appeared darker than usual, and legs swelling.  She has a hx of alcohol abuse and was diagnosed w/ alcoholic hepatits & chronic edema in LEs- TBili=8.1, AST=601, ALT=184, AlkPhos=76, K=3.3, other labs ok.  She was given 16mEq KCl orally, asked to stop the Etoh, and referred here.  She was on no regular meds...     She returned to the ER 3/7 after a fall at home.  She states that her legs just gave out, called EMS & taken back to the ER c/o pain in left wrist, left hip, and left ankle.  She did not hit her head.  There were no skin abrasions or lacerations, and she was intact neurologically.  XRays were NEG- no fractures, but there was some mild DJD in hip.  Due to her severe LE edema- she has Ven Dopplers performed- lim exam was NEG, no evid for DVT.  She was encouraged to follow the plan- stop etoh & establish at [facility]...   PROBLEM LIST -- based on this background history & today's exam >>        Hypertension>  [name] has a hx of HBP but she can't recall the last time she took medication for this problem.  BP today is 160/100 and she is morbidly obese w/ severe LE edema.  She denies CP, palpit, dizziness, but notes long standing edema & SOB/ DOE w/ walking.  She does not recall ever being told that she has a heart murmur.  We discussed the importance of weight reduction and NO SALT.  We are starting METOPROLOL 50mg  Bid and LASIX 40mg  Qam- we plan rov recheck in 35month...     Heart murmur- r/o AS>  Exam  revealed a Gr2/6 SEM at the base, ?radiation to carotids.  She does not recall being told of a murmur in the past.  She has never had an EKG (nothing in Epic).  We could not get her on our high exam table (despite stepping stool) due to her obesity.  We discussed the need for both an EKG and a 2DEchocardiogram and will sched this at [facility]...     Morbid Obesity>  [name] weighs ~300# and is 5\' 1"  Tall for a BMI of 58, she has a lot of edema w/ bilat lymphedema changes in LEs (L>R).  We discussed how stopping the alcohol will help some, cutting back on calories (esp low carb diet), increase exercise, etc.  We reviewed a simple "intermittent fast" diet to help her burn fat...       Alcohol abuse & alcoholic hepatitis>  She has a long hx of alcohol abuse.  Prev drank vodka and gin "the cheap stuff" but noted that she would have withdrawal symptoms when she stopped transiently.  She tells me that a doctor told her years ago to switch to brandy to eliminate this difficulty and she currently drinks 1/2 gal of Marykay Lex  every 2 days.  She went to the ER 11/03/18 when she noted dark urine & was found to have mild jaundice w/ abn labs- TBili=8.1, AST=601, ALT=184, AlkPhos=76, K=3.3, other labs ok.  She says she stopped drinking that day and did not have any withdrawal symptoms.  We discussed checking follow up blood work today 11/20/18=> TBili=1.6, AST=176, ALT=114... she understands the risks w/ continued alcohol consumption and promises to remain abstinent...   LABS 11/20/18>  CMet showed BS=111, K=3.9, Cr=0.66, Bili=1.6, AST=176, ALT=114;  TSH=3.36     LE lymphedema>  Unfortunately we do not have any additional medical history.  We discussed the need to eliminate salt/ sodium from her diet, get weight down, wear ACE wraps for some compression, and start LASIX 40mg  Qam along w/ K20 daily...      Gait abnormality>  This is clearly multifactorial & related to her morbid obesity, severe LE edema, DJD, etc... we could  consider some PT later.   Past Medical History:  Diagnosis Date  . Diverticulitis   ~ Hypertension -- not on meds, we started Metoprolol50Bid + Furosemide 40Qam & K20   ~ Heart murmur- r/o AS -- we ordered a baseline EKG & 2DEchocardiogram   ~ Severe LE edema (lymphedema) -- not prev on meds, we started DIET + Lasix40 & K20   ~ Morbid Obesity -- we reviewed an intermittent fasting diet strategy...   ~ Alcoholic hepatitis -- she is hopeful that the liver enz will improve w/ abstinence    ~ Chronic alcoholism -- she was drinking "Marykay Lex Brandy" & understands the need to abstain     Past Surgical History:  Procedure Laterality Date  . TUBAL LIGATION      Outpatient Encounter Medications as of 11/20/2018  Medication Sig  . Acetaminophen-Aspirin Buffered (EXCEDRIN BACK & BODY) 250-250 MG tablet Take 2 tablets by mouth every 4 (four) hours as needed for pain.  .   (Patient not taking: Reported on 10/12/2016)  .   (Patient not taking: Reported on 10/12/2016)  .   (Patient not taking: Reported on 10/12/2016)  . BIOTIN PO Take 1 tablet by mouth daily.  .   (Patient not taking: Reported on 11/20/2018)  . Misc Natural Products (LAXATIVE FORMULA) TABS Take 1 tablet by mouth once.  . Multiple Vitamins-Minerals (CENTRUM SILVER ADULT 50+) TABS Take 1 tablet by mouth daily.  .  (Patient not taking: Reported on 10/12/2016)  . potassium chloride SA (K-DUR,KLOR-CON) 20 MEQ tablet Take 1 tablet (20 mEq total) by mouth daily.  .   (Patient not taking: Reported on 11/20/2018)  .   (Patient not taking: Reported on 10/12/2016)  .   (Patient not taking: Reported on 10/12/2016)    History reviewed. No pertinent family history.   Social History   Socioeconomic History  . Marital status: Legally Separated    Spouse name: Not on file  . Number of children: Not on file  . Years of education: Not on file  . Highest education level: Not on file  Occupational History  . Not on file  Social Needs  .  Financial resource strain: Not on file  . Food insecurity:    Worry: Not on file    Inability: Not on file  . Transportation needs:    Medical: Not on file    Non-medical: Not on file  Tobacco Use  . Smoking status: Never Smoker  . Smokeless tobacco: Never Used  Substance and Sexual Activity  . Alcohol use: Yes  Alcohol/week: 10.0 standard drinks    Types: 10 Shots of liquor per week  . Drug use: No  . Sexual activity: Not on file  Lifestyle  . Physical activity:    Days per week: Not on file    Minutes per session: Not on file  . Stress: Not on file  Relationships  . Social connections:    Talks on phone: Not on file    Gets together: Not on file    Attends religious service: Not on file    Active member of club or organization: Not on file    Attends meetings of clubs or organizations: Not on file    Relationship status: Not on file  . Intimate partner violence:    Fear of current or ex partner: Not on file    Emotionally abused: Not on file    Physically abused: Not on file    Forced sexual activity: Not on file  Other Topics Concern  . Not on file  Social History Narrative  . Not on file    Review of Systems       All symptoms NEG except where BOLDED >>  Constitutional:  F/C/S, fatigue, anorexia, unexpected weight change. HEENT:  HA, visual changes, hearing loss, earache, nasal symptoms, sore throat, mouth sores, hoarseness. Resp:  cough, sputum, hemoptysis; SOB, tightness, wheezing. Cardio:  CP, palpit, DOE, orthopnea, edema. GI:  N/V/D/C, blood in stool; reflux, abd pain, distention, gas. GU:  dysuria, freq, urgency, hematuria, flank pain, voiding difficulty. MS:  joint pain, swelling, tenderness, decr ROM; neck pain, back pain, etc. Neuro:  HA, tremors, seizures, dizziness, syncope, weakness, numbness, gait abn. Skin:  suspicious lesions or skin rash. Heme:  adenopathy, bruising, bleeding. Psyche:  confusion, agitation, sleep disturbance, hallucinations,  anxiety, depression suicidal.   Objective:   Today's Vitals: BP (!) 159/99   Pulse (!) 102   Temp 97.8 F (36.6 C) (Oral)   Wt (!) 303 lb 6.4 oz (137.6 kg)   SpO2 98%   BMI 57.33 kg/m   Physical Exam Vital Signs:  Reviewed...   General:  WD, morbidly obese, 63 y/o woman in NAD; alert & oriented; pleasant & cooperative... HEENT:  [place]/AT; Conjunctiva- pink, Sclera- nonicteric now, EOM-wnl, PERRLA; EACs-clear, TMs-wnl; NOSE-clear; THROAT-clear & wnl.  Neck:  Supple w/ fair ROM; no JVD; normal carotid impulses w/o bruits; no thyromegaly or nodules palpated; no lymphadenopathy.  Chest:  Decr BS bilat but clear without wheezes, rales, or rhonchi heard. Heart:  Regular Rhythm; Gr2/6 SEM at base & ?radiating up to carotids; S4 gallop, no diastolic murmur apprec, or rubs etc... Abdomen:  Obese w/ panniculus, soft & nontender- no guarding or rebound; normal bowel sounds; no masses palpated. Ext:  Decr ROM, mild arthritic changes; severe LE edema/ lymphedema bilat L>R, cannot feel pulses due to edema but appears well perfused. Neuro:  CNs II-XII intact; motor testing 4/5; sensory testing diminished in LEs; gait abnormal & balance OK. Derm:  No lesions noted; no rash etc. Lymph:  No cervical, supraclavicular, axillary, or inguinal adenopathy palpated.  Assessment & Plan:   Problem List Items Addressed This Visit      Cardiovascular and Mediastinum      Essential hypertension -- we decided to start METOPROLOL 50mg Bid + Lasix40 & K20...   Heart murmur - r/o AS -- we ordered a baseline EKG & 2DEcho   Severe LE edema -- we reviewed NO SALT/ Sodium, DIET, ACE wraps + Lasix40 & K20...   Relevant Medications  metoprolol tartrate (LOPRESSOR) 50 MG tablet   furosemide (LASIX) 40 MG tablet   Other Relevant Orders   Comprehensive metabolic panel (Completed)   ECHOCARDIOGRAM COMPLETE     Digestive   Alcoholic hepatitis -- we checked CMet & LFTs are improved.  She says she has quit alcohol    Relevant Orders   US Abdomen Complete     Other   Alcohol abuse   MORBID OBESITY -- BMI ~57 & we reviewed an intermittent fasting diet + the imperative for sodium restriction...   Relevant Orders   TSH (Completed)      Outpatient Encounter Medications as of 11/20/2018  Medication Sig  . Acetaminophen-Aspirin Buffered (EXCEDRIN BACK & BODY) 250-250 MG tablet Take 2 tablets by mouth every 4 (four) hours as needed for pain.  Marland Kitchen BIOTIN PO Take 1 tablet by mouth daily.  . furosemide (LASIX) 40 MG tablet Take 1 tablet (40 mg total) by mouth daily.  . metoprolol tartrate (LOPRESSOR) 50 MG tablet Take 1 tablet (50 mg total) by mouth 2 (two) times daily.  . Misc Natural Products (LAXATIVE FORMULA) TABS Take 1 tablet by mouth once.  . Multiple Vitamins-Minerals (CENTRUM SILVER ADULT 50+) TABS Take 1 tablet by mouth daily.  . potassium chloride SA (K-DUR,KLOR-CON) 20 MEQ tablet Take 1 tablet (20 mEq total) by mouth daily.    Follow-up: Return in about 4 weeks (around 12/18/2018).   Teressa Lower, MD

## 2018-12-17 NOTE — Progress Notes (Signed)
Patient ID: [name], female   DOB: 1955-09-30, 63 y.o.   MRN: [id number]  Virtual Visit via Telephone Note  I connected with [name] on 12/17/18 at 10:30 AM EDT by telephone and verified that I am speaking with the correct person using two identifiers.   Consent:  I discussed the limitations, risks, security and privacy concerns of performing an evaluation and management service by telephone and the availability of in person appointments. I also discussed with the patient that there may be a patient responsible charge related to this service. The patient expressed understanding and agreed to proceed.  Location of patient: The patient was in her car and was by herself  Location of provider: I was in my office  Persons participating in the televisit with the patient.  No one else was with the patient at the time of the visit  History of Present Illness: This is a 63 year old female with history of hypertension and newly diagnosed systolic murmur with associated morbid obesity and previous heavy alcohol use.  The patient was last seen on 23 March with Dr. Lenna Gilford for a follow-up visit and to establish.  At that visit the patient was documented blood pressure of 160/100 and she had significant lower extremity edema.  At that time the patient was begun on metoprolol 50 mg twice daily and furosemide 40 mg daily along with a potassium supplement.  The patient states she is done very well on this program and states she has lost weight though she has not measured it exactly on a scale her lower extremity edema has markedly improved.  She does not have a way to measure her blood pressure at home.  She is less short of breath and having no palpitations dizziness.  She does have a history of poor balance and has had a history of frequent falls however.  She would like to change the location of her apartment to a downstairs apartment secondary to fall risk going up stairs. At the last visit Dr. Lenna Gilford  detected a systolic ejection murmur and she had an echocardiogram scheduled however it is pending right now secondary to the COVID testing restrictions.  She does states she is no longer taking alcohol.  And she is following the intermittent fast diet offered by Dr. Lenna Gilford at the last visit.  Overall is quite certain the patient is improving. Note at the last visit the patient had mild elevations of AST and ALT however her TSH and renal function was normal.  Given the gait abnormality I would like to be able to have this patient seen by physical therapy however we are on hold on any new physical therapy referrals until we get past the COVID pandemic        Pos In BOLD  Constitutional:   No  weight loss, night sweats,  Fevers, chills, fatigue, lassitude. HEENT:   No headaches,  Difficulty swallowing,  Tooth/dental problems,  Sore throat,                No sneezing, itching, ear ache, nasal congestion, post nasal drip,   CV:  No chest pain,  Orthopnea, PND, swelling in lower extremities, anasarca, dizziness, palpitations  GI  No heartburn, indigestion, abdominal pain, nausea, vomiting, diarrhea, change in bowel habits, loss of appetite  Resp: No shortness of breath with exertion or at rest.  No excess mucus, no productive cough,  No non-productive cough,  No coughing up of blood.  No change in color of mucus.  No wheezing.  No chest wall deformity  Skin: no rash or lesions.  GU: no dysuria, change in color of urine, no urgency or frequency.  No flank pain.  MS:  No joint pain or swelling.  No decreased range of motion.  No back pain.  Psych:  No change in mood or affect. No depression or anxiety.  No memory loss.  Observations/Objective: No observations made as this was a telephone visit  Assessment and Plan: #1 hypertension: I am going to obtain for the patient a blood pressure cuff for home monitoring as she has Medicaid and I indicated to this to the patient for to pick this up and start  recording blood pressures and a heart rate in her diary.  Plan for now will be to maintain furosemide daily and metoprolol twice daily and follow-up in the office  #2 morbid obesity plan is for the patient to continue her diet that she is currently undertaking  #3 gait imbalance with frequent falls: We will ask case management to get involved to see if she can have her housing situation improved and will obtain a physical therapy consult once the COVID pandemic subsides  #4 alcohol use with alcoholic hepatitis she still has this as of the active problem and we will need to monitor her liver functions when she returns to the office  Follow Up Instructions: Plan will be to return the patient end of May to see me in the office for complete lab follow-up to follow-up her liver functions and as well assess her blood pressure control and edema  We will also endeavor to get the echocardiogram done once the pandemic subsides  The patient understands these follow-up instructions   I discussed the assessment and treatment plan with the patient. The patient was provided an opportunity to ask questions and all were answered. The patient agreed with the plan and demonstrated an understanding of the instructions.   The patient was advised to call back or seek an in-person evaluation if the symptoms worsen or if the condition fails to improve as anticipated.  I provided 25 minutes of non-face-to-face time during this encounter  including  median intraservice time , review of notes, labs, imaging, medications  and explaining diagnosis and management to the patient .    Asencion Noble, MD

## 2018-12-18 ENCOUNTER — Ambulatory Visit (HOSPITAL_COMMUNITY): Payer: Medicaid Other

## 2018-12-18 ENCOUNTER — Ambulatory Visit: Payer: Self-pay | Admitting: Critical Care Medicine

## 2018-12-18 ENCOUNTER — Telehealth: Payer: Self-pay

## 2018-12-18 ENCOUNTER — Encounter: Payer: Self-pay | Admitting: Critical Care Medicine

## 2018-12-18 ENCOUNTER — Ambulatory Visit: Payer: Medicaid Other | Attending: Critical Care Medicine | Admitting: Critical Care Medicine

## 2018-12-18 ENCOUNTER — Other Ambulatory Visit: Payer: Self-pay

## 2018-12-18 DIAGNOSIS — K7010 Alcoholic hepatitis without ascites: Secondary | ICD-10-CM

## 2018-12-18 DIAGNOSIS — Z6841 Body Mass Index (BMI) 40.0 and over, adult: Secondary | ICD-10-CM

## 2018-12-18 DIAGNOSIS — I10 Essential (primary) hypertension: Secondary | ICD-10-CM

## 2018-12-18 DIAGNOSIS — F10939 Alcohol use, unspecified with withdrawal, unspecified: Secondary | ICD-10-CM

## 2018-12-18 DIAGNOSIS — F10239 Alcohol dependence with withdrawal, unspecified: Secondary | ICD-10-CM

## 2018-12-18 DIAGNOSIS — F1021 Alcohol dependence, in remission: Secondary | ICD-10-CM

## 2018-12-18 DIAGNOSIS — R011 Cardiac murmur, unspecified: Secondary | ICD-10-CM | POA: Diagnosis not present

## 2018-12-18 DIAGNOSIS — F1094 Alcohol use, unspecified with alcohol-induced mood disorder: Secondary | ICD-10-CM | POA: Diagnosis not present

## 2018-12-18 MED ORDER — CVS ADVANCED BP MONITOR DEVI: 0 refills | Status: DC

## 2018-12-18 NOTE — Telephone Encounter (Signed)
Call placed to patient to discuss her housing concerns.  She explained that she lives in [place] and has a 2 bedroom apartment with the bathroom upstairs.  She has been incontinent of urine because she can't get to the bathroom quickly and she has also experienced a couple of falls.  She would like to be moved to a first floor apartment and and has a letter from the housing authority that addresses this request but the document needs a provider signature. Instructed her to drop the paperwork at the clinic for the provider to sign and she stated that she would drop off tomorrow.

## 2018-12-25 ENCOUNTER — Telehealth: Payer: Self-pay

## 2018-12-25 NOTE — Telephone Encounter (Signed)
Call received from patient who stated that she spoke to Ms Pearline Cables at the housing authority and she was instructed to speak to her landlord.  The patient said that she spoke to her landlord - Mr Arnell Sieving and he requested that this CM contact him # [phone].   Call placed to Mr Arnell Sieving.  He explained that the patient needs to complete the first page of the request noting her reason for the accommodation and then sign.  The provider will complete page 2 and the documents can be faxed to him - [place] authority - fax # [phone].   Call placed to patient and informed her of the instructions from Mr Arnell Sieving. She said that she would come to [facility] tomorrow to complete her portion of the request. Informed her that the document would be left with registrar at the front door and she could contact this CM when she arrives.

## 2018-12-25 NOTE — Telephone Encounter (Signed)
Patient dropped off documentation for [employer] regarding request for a reasonable accommodation.  She had not signed the documents and it was not specified where to sent the information.   Call placed to the [facility] to clarify.  Spoke to Miss Henrene Pastor # [phone] and explained what the patient had dropped off.  Ms Pearline Cables said that the patient needs to speak to the [employer] specialist and the patient should call her ( Miss Pearline Cables) back for additional information about this request process.  The provider did not need to complete any information at this time.   Call placed to the patient and explained above. Provided her with Miss Gray's phone number and instructed her to call.  She repeated back the correct phone number and said that she would call now.

## 2018-12-26 ENCOUNTER — Telehealth: Payer: Self-pay

## 2018-12-26 NOTE — Telephone Encounter (Signed)
Met with the patient when she came to the clinic today to sign her request for accommodation with her housing.  Informed her that Dr Joya Gaskins will be back in the office next week to review/sign.

## 2019-01-01 NOTE — Telephone Encounter (Signed)
Signed request for reasonable accommodation faxed to attention of Art Roebusk, [employer].

## 2019-01-02 ENCOUNTER — Telehealth: Payer: Self-pay | Admitting: Pulmonary Disease

## 2019-01-02 NOTE — Telephone Encounter (Signed)
Called [facility], spoke with Butch Penny. She confirmed on the phone they received the authorization needed and can proceed with procedure tomorrow. Nothing further needed.

## 2019-01-03 ENCOUNTER — Other Ambulatory Visit: Payer: Self-pay

## 2019-01-03 ENCOUNTER — Ambulatory Visit (HOSPITAL_COMMUNITY)
Admission: RE | Admit: 2019-01-03 | Discharge: 2019-01-03 | Disposition: A | Discharge status: Home / Self Care | Payer: Medicaid Other | Source: Ambulatory Visit | Attending: Pulmonary Disease | Admitting: Pulmonary Disease

## 2019-01-03 DIAGNOSIS — I10 Essential (primary) hypertension: Secondary | ICD-10-CM

## 2019-01-03 DIAGNOSIS — R011 Cardiac murmur, unspecified: Secondary | ICD-10-CM | POA: Insufficient documentation

## 2019-01-03 NOTE — Progress Notes (Signed)
  Echocardiogram 2D Echocardiogram has been performed.  Diyan Dave G Glenyce Randle 01/03/2019, 4:11 PM

## 2019-01-10 ENCOUNTER — Ambulatory Visit: Payer: Medicaid Other | Attending: Critical Care Medicine | Admitting: Critical Care Medicine

## 2019-01-10 ENCOUNTER — Encounter: Payer: Self-pay | Admitting: Critical Care Medicine

## 2019-01-10 ENCOUNTER — Other Ambulatory Visit: Payer: Self-pay

## 2019-01-10 VITALS — BP 120/80 | HR 72 | Temp 98.6°F | Resp 20 | Ht 61.0 in | Wt 323.4 lb

## 2019-01-10 DIAGNOSIS — R011 Cardiac murmur, unspecified: Secondary | ICD-10-CM | POA: Insufficient documentation

## 2019-01-10 DIAGNOSIS — B351 Tinea unguium: Secondary | ICD-10-CM | POA: Diagnosis not present

## 2019-01-10 DIAGNOSIS — M21611 Bunion of right foot: Secondary | ICD-10-CM | POA: Insufficient documentation

## 2019-01-10 DIAGNOSIS — R296 Repeated falls: Secondary | ICD-10-CM | POA: Diagnosis not present

## 2019-01-10 DIAGNOSIS — K7010 Alcoholic hepatitis without ascites: Secondary | ICD-10-CM | POA: Diagnosis not present

## 2019-01-10 DIAGNOSIS — Z6841 Body Mass Index (BMI) 40.0 and over, adult: Secondary | ICD-10-CM | POA: Diagnosis not present

## 2019-01-10 DIAGNOSIS — Z1211 Encounter for screening for malignant neoplasm of colon: Secondary | ICD-10-CM | POA: Diagnosis not present

## 2019-01-10 DIAGNOSIS — I10 Essential (primary) hypertension: Secondary | ICD-10-CM

## 2019-01-10 DIAGNOSIS — Z79899 Other long term (current) drug therapy: Secondary | ICD-10-CM | POA: Diagnosis not present

## 2019-01-10 DIAGNOSIS — F1029 Alcohol dependence with unspecified alcohol-induced disorder: Secondary | ICD-10-CM

## 2019-01-10 DIAGNOSIS — Z1231 Encounter for screening mammogram for malignant neoplasm of breast: Secondary | ICD-10-CM | POA: Insufficient documentation

## 2019-01-10 DIAGNOSIS — R609 Edema, unspecified: Secondary | ICD-10-CM

## 2019-01-10 DIAGNOSIS — Z114 Encounter for screening for human immunodeficiency virus [HIV]: Secondary | ICD-10-CM

## 2019-01-10 DIAGNOSIS — F1010 Alcohol abuse, uncomplicated: Secondary | ICD-10-CM

## 2019-01-10 DIAGNOSIS — Z23 Encounter for immunization: Secondary | ICD-10-CM

## 2019-01-10 DIAGNOSIS — R531 Weakness: Secondary | ICD-10-CM | POA: Diagnosis not present

## 2019-01-10 DIAGNOSIS — Z1159 Encounter for screening for other viral diseases: Secondary | ICD-10-CM

## 2019-01-10 DIAGNOSIS — M5441 Lumbago with sciatica, right side: Secondary | ICD-10-CM

## 2019-01-10 DIAGNOSIS — G8929 Other chronic pain: Secondary | ICD-10-CM | POA: Diagnosis not present

## 2019-01-10 DIAGNOSIS — Z1239 Encounter for other screening for malignant neoplasm of breast: Secondary | ICD-10-CM

## 2019-01-10 DIAGNOSIS — M21612 Bunion of left foot: Secondary | ICD-10-CM

## 2019-01-10 DIAGNOSIS — R2689 Other abnormalities of gait and mobility: Secondary | ICD-10-CM | POA: Insufficient documentation

## 2019-01-10 MED ORDER — BENAZEPRIL HCL 10 MG PO TABS: 10.0000 mg | ORAL_TABLET | Freq: Every day | ORAL | 2 refills | Status: DC

## 2019-01-10 MED ORDER — POTASSIUM CHLORIDE CRYS ER 20 MEQ PO TBCR: 20.0000 meq | EXTENDED_RELEASE_TABLET | Freq: Every day | ORAL | 3 refills | Status: DC

## 2019-01-10 NOTE — Patient Instructions (Signed)
Labs today will include HIV and complete metabolic panel along with a blood count  No change in your medications and I did send extra refills on your metoprolol and Benzapril to your pharmacy  Please focus on alcohol cessation and reconnect with AA and our licensed clinical social worker will also be in touch with you  An x-ray of your back will be made and we will consider a physical therapy consult based on findings of the back x-ray  A referral to podiatry a foot doctor will be made  A mammogram has been scheduled  You will be given a stool card to check for blood in the stool as a way of colon cancer screening  Please follow the weight loss diet as outlined below watching your salt intake  Hepatitis C and hepatitis B screen will also be sent  Return to see Dr. Joya Gaskins in 1 month

## 2019-01-10 NOTE — Assessment & Plan Note (Signed)
Ongoing alcohol dependence with alcohol induced hepatitis we will follow-up liver function test at this visit

## 2019-01-10 NOTE — Assessment & Plan Note (Signed)
A weight loss diet was reviewed with the patient

## 2019-01-10 NOTE — Assessment & Plan Note (Signed)
History of hypertension now well controlled on current program of furosemide Benzapril and metoprolol will continue current medication profile

## 2019-01-10 NOTE — Assessment & Plan Note (Signed)
The patient has bilateral toenail fungus and bunions with dry skin on the feet and foot pain we will thus refer to podiatry

## 2019-01-10 NOTE — Progress Notes (Signed)
Subjective:    Patient ID: [name], female    DOB: January 31, 1956, 63 y.o.   MRN: [id number]  This is a 63 year old female with history of hypertension and newly diagnosed systolic murmur with associated morbid obesity and previous heavy alcohol use.  The patient was seen on 23 March with Dr. Lenna Gilford for a follow-up visit and to establish.  At that visit the patient was documented blood pressure of 160/100 and she had significant lower extremity edema.  At that time the patient was begun on metoprolol 50 mg twice daily and furosemide 40 mg daily along with a potassium supplement.  The patient states she is done very well on this program and states she has lost weight though she has not measured it exactly on a scale her lower extremity edema has markedly improved.  She does not have a way to measure her blood pressure at home.  She is less short of breath and having no palpitations dizziness.  She does have a history of poor balance and has had a history of frequent falls however.  She would like to change the location of her apartment to a downstairs apartment secondary to fall risk going up stairs.  Since the last visit that I had with the patient which was a telephone visit the patient states she still has imbalance and has fall risk.  She also still using alcohol and in fact has been binge drinking over the past week.  She states triggers for this include stress and anxiety and when she is invited to social events.  She had been with Alcoholics Anonymous previously however has moved away from that more recently.  Note during a visit previously she had mild elevations in her liver function tests.  This needs to be followed up as well.  The patient does not have a blood pressure cuff in the home and has been running blood pressures 120-130/80.  She maintains a metoprolol and the furosemide.  She does complain of some back pain and radicular type pain down the right leg and this promotes weakness in the  leg.  She has care gaps including the need for colon cancer screening tetanus vaccine and Pap smear  Note an echocardiogram has been performed and showed a normal ejection fraction and no evidence of valve disease  The patient does complain of edema in the left foot and dryness in the skin     Past Medical History:  Diagnosis Date  . Diverticulitis   . Hypertension      History reviewed. No pertinent family history.   Social History   Socioeconomic History  . Marital status: Legally Separated    Spouse name: Not on file  . Number of children: Not on file  . Years of education: Not on file  . Highest education level: Not on file  Occupational History  . Not on file  Social Needs  . Financial resource strain: Not on file  . Food insecurity:    Worry: Not on file    Inability: Not on file  . Transportation needs:    Medical: Not on file    Non-medical: Not on file  Tobacco Use  . Smoking status: Never Smoker  . Smokeless tobacco: Never Used  Substance and Sexual Activity  . Alcohol use: Yes    Alcohol/week: 10.0 standard drinks    Types: 10 Shots of liquor per week  . Drug use: No  . Sexual activity: Not on file  Lifestyle  .  Physical activity:    Days per week: Not on file    Minutes per session: Not on file  . Stress: Not on file  Relationships  . Social connections:    Talks on phone: Not on file    Gets together: Not on file    Attends religious service: Not on file    Active member of club or organization: Not on file    Attends meetings of clubs or organizations: Not on file    Relationship status: Not on file  . Intimate partner violence:    Fear of current or ex partner: Not on file    Emotionally abused: Not on file    Physically abused: Not on file    Forced sexual activity: Not on file  Other Topics Concern  . Not on file  Social History Narrative  . Not on file     No Known Allergies   Outpatient Medications Prior to Visit  Medication  Sig Dispense Refill  . Blood Pressure Monitoring ([employer] ADVANCED BP MONITOR) DEVI Measure blood pressure daily and record in diary 1 Device 0  . furosemide (LASIX) 40 MG tablet Take 1 tablet (40 mg total) by mouth daily. 30 tablet 6  . metoprolol tartrate (LOPRESSOR) 50 MG tablet Take 1 tablet (50 mg total) by mouth 2 (two) times daily. 60 tablet 6  . Misc Natural Products (LAXATIVE FORMULA) TABS Take 1 tablet by mouth once.    . Multiple Vitamins-Minerals (CENTRUM SILVER ADULT 50+) TABS Take 1 tablet by mouth daily.    . benazepril (LOTENSIN) 10 MG tablet Take 1 tablet (10 mg total) by mouth daily. For hypentesion 30 tablet 2  . potassium chloride SA (K-DUR,KLOR-CON) 20 MEQ tablet Take 1 tablet (20 mEq total) by mouth daily. 30 tablet 3   No facility-administered medications prior to visit.     BP at home 130/80 Wt Readings from Last 3 Encounters:  01/10/19 (!) 323 lb 6.4 oz (146.7 kg)  11/20/18 (!) 303 lb 6.4 oz (137.6 kg)  11/04/18 285 lb (129.3 kg)    Review of Systems  Constitutional: Negative for fatigue and fever.  HENT: Negative.   Respiratory: Negative.   Cardiovascular: Positive for leg swelling. Negative for chest pain and palpitations.  Genitourinary: Positive for frequency.  Neurological: Negative.   Psychiatric/Behavioral: Negative for self-injury and suicidal ideas. The patient is nervous/anxious.        Objective:   Physical Exam Vitals:   01/10/19 0914  BP: 120/80  Pulse: 72  Resp: 20  Temp: 98.6 F (37 C)  TempSrc: Oral  SpO2: 98%  Weight: (!) 323 lb 6.4 oz (146.7 kg)  Height: 5\' 1"  (1.549 m)    Gen: Pleasant, morbidly obese , in no distress,  normal affect  ENT: No lesions,  mouth clear,  oropharynx clear, no postnasal drip  Neck: No JVD, no TMG, no carotid bruits  Lungs: No use of accessory muscles, no dullness to percussion, clear without rales or rhonchi  Cardiovascular: RRR, heart sounds normal, no murmur or gallops, 2+ peripheral edema   Abdomen: soft and NT, no HSM,  BS normal  Musculoskeletal: No deformities, no cyanosis or clubbing Feet show dry skin, bunions and toenail fungus   Neuro: alert, non focal  Skin: Warm, no lesions or rashes  Echo 5/6: IMPRESSIONS    1. The left ventricle has hyperdynamic systolic function, with an ejection fraction of >65%. The cavity size was normal. Left ventricular diastolic Doppler parameters are consistent with  pseudonormalization. No evidence of left ventricular regional wall  motion abnormalities.  2. The right ventricle has normal systolic function. The cavity was normal. There is no increase in right ventricular wall thickness.  3. Left atrial size was moderately dilated.  4. The aortic root and ascending aorta are normal in size and structure.  5. The interatrial septum was not well visualized.  FINDINGS  Left Ventricle: The left ventricle has hyperdynamic systolic function, with an ejection fraction of >65%. The cavity size was normal. There is no increase in left ventricular wall thickness. Left ventricular diastolic Doppler parameters are consistent  with pseudonormalization. No evidence of left ventricular regional wall motion abnormalities..  Right Ventricle: The right ventricle has normal systolic function. The cavity was normal. There is no increase in right ventricular wall thickness.  Left Atrium: Left atrial size was moderately dilated.  Right Atrium: Right atrial size was normal in size. Right atrial pressure is estimated at 3 mmHg.  Interatrial Septum: The interatrial septum was not well visualized.  Pericardium: There is no evidence of pericardial effusion.  Mitral Valve: The mitral valve is normal in structure. Mitral valve regurgitation is trivial by color flow Doppler.  Tricuspid Valve: The tricuspid valve is normal in structure. Tricuspid valve regurgitation is trivial by color flow Doppler.  Aortic Valve: The aortic valve is normal in  structure. Aortic valve regurgitation was not visualized by color flow Doppler.  Pulmonic Valve: The pulmonic valve was grossly normal. Pulmonic valve regurgitation is not visualized by color flow Doppler.  Aorta: The aortic root and ascending aorta are normal in size and structure.  Venous: The inferior vena cava is normal in size with greater than 50% respiratory variability.     Assessment & Plan:  I personally reviewed all images and lab data in the [facility] system as well as any outside material available during this office visit and agree with the  radiology impressions.   Essential hypertension History of hypertension now well controlled on current program of furosemide Benzapril and metoprolol will continue current medication profile  Alcoholic hepatitis Ongoing alcohol dependence with alcohol induced hepatitis we will follow-up liver function test at this visit  Alcohol dependence Ongoing alcohol dependence for this will refer to licensed clinical social worker to get her reconnected with alcoholics anonymous and advise abstinence  Frequent falls Patient has frequent falls secondary to gait anomaly secondary to low back pain  The patient will be given a cane to help with walking and once the COVID restrictions are released we will have physical therapy see this patient  LOW BACK PAIN We will obtain lumbar sacral spine films to evaluate the back pain  OBESITY A weight loss diet was reviewed with the patient  Toenail fungus The patient has bilateral toenail fungus and bunions with dry skin on the feet and foot pain we will thus refer to podiatry   Adeola was seen today for follow-up.  Diagnoses and all orders for this visit:  Alcoholic hepatitis without ascites -     Comprehensive metabolic panel  Alcohol abuse  Edema, unspecified type -     Comprehensive metabolic panel  Alcohol dependence with unspecified alcohol-induced disorder (HCC)  Chronic bilateral low  back pain with right-sided sciatica -     DG Lumbar Spine Complete; Future  Class 3 severe obesity due to excess calories with serious comorbidity and body mass index (BMI) of 50.0 to 59.9 in adult [facility])  Essential hypertension -     Comprehensive metabolic panel  Frequent falls -     DG Lumbar Spine Complete; Future  Encounter for screening for HIV -     HIV Antibody (routine testing w rflx)  Colon cancer screening -     Fecal occult blood, imunochemical  Breast cancer screening -     MM DIGITAL SCREENING BILATERAL; Future  Need for hepatitis C screening test -     Hepatitis C antibody  Need for hepatitis B screening test -     Hep B Core Ab W/Reflex  Toenail fungus -     Ambulatory referral to Podiatry  Bilateral bunions -     Ambulatory referral to Podiatry  Other orders -     potassium chloride SA (K-DUR) 20 MEQ tablet; Take 1 tablet (20 mEq total) by mouth daily. -     benazepril (LOTENSIN) 10 MG tablet; Take 1 tablet (10 mg total) by mouth daily. For hypentesion  A tetanus vaccine was administered today we will also arrange for colon cancer screening and breast cancer screening and as well hepatitis B and a will be checked with the elevated liver functions

## 2019-01-10 NOTE — Assessment & Plan Note (Signed)
Patient has frequent falls secondary to gait anomaly secondary to low back pain  The patient will be given a cane to help with walking and once the COVID restrictions are released we will have physical therapy see this patient

## 2019-01-10 NOTE — Assessment & Plan Note (Signed)
We will obtain lumbar sacral spine films to evaluate the back pain

## 2019-01-10 NOTE — Assessment & Plan Note (Signed)
Ongoing alcohol dependence for this will refer to licensed clinical social worker to get her reconnected with alcoholics anonymous and advise abstinence

## 2019-01-10 NOTE — Addendum Note (Signed)
Addended by: Rosalio Macadamia on: 01/10/2019 02:06 PM   Modules accepted: Orders

## 2019-01-11 LAB — COMPREHENSIVE METABOLIC PANEL
ALT: 24 IU/L (ref 0–32)
AST: 41 IU/L — ABNORMAL HIGH (ref 0–40)
Albumin/Globulin Ratio: 0.9 — ABNORMAL LOW (ref 1.2–2.2)
Albumin: 4.1 g/dL (ref 3.8–4.8)
Alkaline Phosphatase: 54 IU/L (ref 39–117)
BUN/Creatinine Ratio: 20 (ref 12–28)
BUN: 17 mg/dL (ref 8–27)
Bilirubin Total: 0.4 mg/dL (ref 0.0–1.2)
CO2: 26 mmol/L (ref 20–29)
Calcium: 9.6 mg/dL (ref 8.7–10.3)
Chloride: 95 mmol/L — ABNORMAL LOW (ref 96–106)
Creatinine, Ser: 0.83 mg/dL (ref 0.57–1.00)
GFR calc Af Amer: 87 mL/min/{1.73_m2} (ref >59)
GFR calc non Af Amer: 75 mL/min/{1.73_m2} (ref >59)
Globulin, Total: 4.4 g/dL (ref 1.5–4.5)
Glucose: 114 mg/dL — ABNORMAL HIGH (ref 65–99)
Potassium: 4.3 mmol/L (ref 3.5–5.2)
Sodium: 139 mmol/L (ref 134–144)
Total Protein: 8.5 g/dL (ref 6.0–8.5)

## 2019-01-11 LAB — HIV ANTIBODY (ROUTINE TESTING W REFLEX): HIV Screen 4th Generation wRfx: NONREACTIVE

## 2019-01-11 LAB — HBCIGM: Hep B C IgM: NEGATIVE

## 2019-01-11 LAB — HEPATITIS B CORE AB W/REFLEX: Hep B Core Total Ab: POSITIVE — AB

## 2019-01-11 LAB — HEPATITIS C ANTIBODY: Hep C Virus Ab: <0.1 s/co ratio (ref 0.0–0.9)

## 2019-01-29 ENCOUNTER — Other Ambulatory Visit: Payer: Self-pay

## 2019-01-29 ENCOUNTER — Other Ambulatory Visit: Payer: Self-pay | Admitting: Podiatry

## 2019-01-29 ENCOUNTER — Ambulatory Visit: Payer: Medicaid Other | Admitting: Podiatry

## 2019-01-29 ENCOUNTER — Encounter: Payer: Self-pay | Admitting: Podiatry

## 2019-01-29 ENCOUNTER — Ambulatory Visit (INDEPENDENT_AMBULATORY_CARE_PROVIDER_SITE_OTHER): Payer: Medicaid Other

## 2019-01-29 VITALS — BP 125/76 | HR 90 | Temp 97.5°F

## 2019-01-29 DIAGNOSIS — B351 Tinea unguium: Secondary | ICD-10-CM | POA: Diagnosis not present

## 2019-01-29 DIAGNOSIS — M659 Synovitis and tenosynovitis, unspecified: Secondary | ICD-10-CM

## 2019-01-29 DIAGNOSIS — M79676 Pain in unspecified toe(s): Secondary | ICD-10-CM

## 2019-01-29 DIAGNOSIS — M21612 Bunion of left foot: Secondary | ICD-10-CM | POA: Diagnosis not present

## 2019-01-29 DIAGNOSIS — M65979 Unspecified synovitis and tenosynovitis, unspecified ankle and foot: Secondary | ICD-10-CM

## 2019-01-29 DIAGNOSIS — M21611 Bunion of right foot: Secondary | ICD-10-CM

## 2019-01-29 DIAGNOSIS — M7751 Other enthesopathy of right foot: Secondary | ICD-10-CM | POA: Diagnosis not present

## 2019-01-29 DIAGNOSIS — M7752 Other enthesopathy of left foot: Secondary | ICD-10-CM | POA: Diagnosis not present

## 2019-01-29 MED ORDER — MELOXICAM 15 MG PO TABS: 15.0000 mg | ORAL_TABLET | Freq: Every day | ORAL | 2 refills | Status: DC

## 2019-01-30 ENCOUNTER — Ambulatory Visit (HOSPITAL_COMMUNITY): Payer: Medicaid Other

## 2019-02-01 NOTE — Progress Notes (Signed)
   SUBJECTIVE 63 year old female presenting today as a new patient with a chief complaint of elongated, thickened nails 1-5 noted bilaterally. She is unable to trim her own nails.  She also complains of bilateral foot pain that has been ongoing for the past 2-3 years. She reports associated sharp pain that is exacerbated by walking. She has not done anything for treatment. Patient is here for further evaluation and treatment.   Past Medical History:  Diagnosis Date  . Diverticulitis   . Hypertension     OBJECTIVE General Patient is awake, alert, and oriented x 3 and in no acute distress. Derm Skin is dry and supple bilateral. Negative open lesions or macerations. Remaining integument unremarkable. Nails are tender, long, thickened and dystrophic with subungual debris, consistent with onychomycosis, 1-5 bilateral. No signs of infection noted. Vasc  DP and PT pedal pulses palpable bilaterally. Temperature gradient within normal limits.  Neuro Epicritic and protective threshold sensation grossly intact bilaterally.  Musculoskeletal Exam Pain with palpation noted to the anterior, lateral and medial aspects of the bilateral ankle joints. No symptomatic pedal deformities noted bilateral. Muscular strength within normal limits.  ASSESSMENT 1. Onychodystrophic nails 1-5 bilateral with hyperkeratosis of nails.  2. Onychomycosis of nail due to dermatophyte bilateral 3. Ankle synovitis bilateral   PLAN OF CARE 1. Patient evaluated today.  2. Instructed to maintain good pedal hygiene and foot care.  3. Mechanical debridement of nails 1-5 bilaterally performed using a nail nipper. Filed with dremel without incident.  4. Injection of 0.5 mLs Celestone Soluspan injected into the bilateral ankle joints.  5. Prescription for Meloxicam provided to patient. 6. Return to clinic in 3 mos.    Edrick Kins, DPM [facility]  Dr. Edrick Kins, [place]                                         [place], [place] [address]                Office [phone]  Fax [phone]

## 2019-02-11 NOTE — Progress Notes (Deleted)
Patient ID: [name], female   DOB: [birth year], 63 y.o.   MRN: [id number] Virtual Visit via Telephone Note  I connected with [name] on 02/11/19 at  9:00 AM EDT by telephone and verified that I am speaking with the correct person using two identifiers.   Consent:  I discussed the limitations, risks, security and privacy concerns of performing an evaluation and management service by telephone and the availability of in person appointments. I also discussed with the patient that there may be a patient responsible charge related to this service. The patient expressed understanding and agreed to proceed.  Location of patient:  Location of provider:  Persons participating in the televisit with the patient.       History of Present Illness: At last OV 5/13: Essential hypertension History of hypertension now well controlled on current program of furosemide Benzapril and metoprolol will continue current medication profile  Alcoholic hepatitis Ongoing alcohol dependence with alcohol induced hepatitis we will follow-up liver function test at this visit  Alcohol dependence Ongoing alcohol dependence for this will refer to licensed clinical social worker to get her reconnected with alcoholics anonymous and advise abstinence  Frequent falls Patient has frequent falls secondary to gait anomaly secondary to low back pain  The patient will be given a cane to help with walking and once the COVID restrictions are released we will have physical therapy see this patient  LOW BACK PAIN We will obtain lumbar sacral spine films to evaluate the back pain  OBESITY A weight loss diet was reviewed with the patient  Toenail fungus The patient has bilateral toenail fungus and bunions with dry skin on the feet and foot pain we will thus refer to podiatry   Observations/Objective:   Assessment and Plan:   Follow Up Instructions:    I discussed the assessment and treatment plan  with the patient. The patient was provided an opportunity to ask questions and all were answered. The patient agreed with the plan and demonstrated an understanding of the instructions.   The patient was advised to call back or seek an in-person evaluation if the symptoms worsen or if the condition fails to improve as anticipated.  I provided *** minutes of non-face-to-face time during this encounter  including  median intraservice time , review of notes, labs, imaging, medications  and explaining diagnosis and management to the patient .    Asencion Noble, MD

## 2019-02-12 ENCOUNTER — Ambulatory Visit: Payer: Medicaid Other | Admitting: Critical Care Medicine

## 2019-02-18 NOTE — Progress Notes (Signed)
Subjective:    Patient ID: [name], female    DOB: [birth year], 63 y.o.   MRN: [id number] Virtual Visit via Telephone Note  I connected with [name] on 02/19/19 at 10:50 AM EDT by telephone and verified that I am speaking with the correct person using two identifiers.   Consent:  I discussed the limitations, risks, security and privacy concerns of performing an evaluation and management service by telephone and the availability of in person appointments. I also discussed with the patient that there may be a patient responsible charge related to this service. The patient expressed understanding and agreed to proceed.  Location of patient: The patient was home  Location of provider: I was in office  Persons participating in the televisit with the patient.   No one else was on the call    History of Present Illness:  This is a telephone visit follow-up for this 63 year old female with a history of hypertension.   This is a 64 year old female with history of hypertension and newly diagnosed systolic murmur with associated morbid obesity and previous heavy alcohol use.  The patient was seen on 23 March with Dr. Lenna Gilford for a follow-up visit and to establish.  At that visit the patient was documented blood pressure of 160/100 and she had significant lower extremity edema.  At that time the patient was begun on metoprolol 50 mg twice daily and furosemide 40 mg daily along with a potassium supplement.  The patient states she is done very well on this program and states she has lost weight though she has not measured it exactly on a scale her lower extremity edema has markedly improved.  She does not have a way to measure her blood pressure at home.  She is less short of breath and having no palpitations dizziness.  She does have a history of poor balance and has had a history of frequent falls however.  She would like to change the location of her apartment to a downstairs apartment  secondary to fall risk going up stairs.  Since the last visit the patient has markedly improved.  She has been to podiatry and they have treated her lower foot pain which is markedly improved.  She has had no further falls.  She has been compliant with her blood pressure medicine with blood pressure in the 120/70 range.  She is now in a 1 level apartment and not having to walk up and down stairs.  She is reduced her alcohol intake as well significantly.  There are no other new complaints.  The patient notes her edema is improved.  Note follow-up labs showed improvement in her liver functions.    Past Medical History:  Diagnosis Date   Diverticulitis    Hypertension      No family history on file.   Social History   Socioeconomic History   Marital status: Legally Separated    Spouse name: Not on file   Number of children: Not on file   Years of education: Not on file   Highest education level: Not on file  Occupational History   Not on file  Social Needs   Financial resource strain: Not on file   Food insecurity    Worry: Not on file    Inability: Not on file   Transportation needs    Medical: Not on file    Non-medical: Not on file  Tobacco Use   Smoking status: Never Smoker   Smokeless tobacco: Never Used  Substance and Sexual Activity   Alcohol use: Yes    Alcohol/week: 10.0 standard drinks    Types: 10 Shots of liquor per week   Drug use: No   Sexual activity: Not on file  Lifestyle   Physical activity    Days per week: Not on file    Minutes per session: Not on file   Stress: Not on file  Relationships   Social connections    Talks on phone: Not on file    Gets together: Not on file    Attends religious service: Not on file    Active member of club or organization: Not on file    Attends meetings of clubs or organizations: Not on file    Relationship status: Not on file   Intimate partner violence    Fear of current or ex partner: Not on  file    Emotionally abused: Not on file    Physically abused: Not on file    Forced sexual activity: Not on file  Other Topics Concern   Not on file  Social History Narrative   Not on file     No Known Allergies   Outpatient Medications Prior to Visit  Medication Sig Dispense Refill   benazepril (LOTENSIN) 10 MG tablet Take 1 tablet (10 mg total) by mouth daily. For hypentesion 30 tablet 2   Blood Pressure Monitoring ([employer] ADVANCED BP MONITOR) DEVI Measure blood pressure daily and record in diary 1 Device 0   furosemide (LASIX) 40 MG tablet Take 1 tablet (40 mg total) by mouth daily. 30 tablet 6   meloxicam (MOBIC) 15 MG tablet Take 1 tablet (15 mg total) by mouth daily. 30 tablet 2   metoprolol tartrate (LOPRESSOR) 50 MG tablet Take 1 tablet (50 mg total) by mouth 2 (two) times daily. 60 tablet 6   Misc Natural Products (LAXATIVE FORMULA) TABS Take 1 tablet by mouth once.     Multiple Vitamins-Minerals (CENTRUM SILVER ADULT 50+) TABS Take 1 tablet by mouth daily.     potassium chloride SA (K-DUR) 20 MEQ tablet Take 1 tablet (20 mEq total) by mouth daily. 30 tablet 3   No facility-administered medications prior to visit.     BP at home 130/80 Wt Readings from Last 3 Encounters:  01/10/19 (!) 323 lb 6.4 oz (146.7 kg)  11/20/18 (!) 303 lb 6.4 oz (137.6 kg)  11/04/18 285 lb (129.3 kg)    Review of Systems  Constitutional: Negative for fatigue and fever.  HENT: Negative.   Respiratory: Negative.   Cardiovascular: Negative for chest pain, palpitations and leg swelling.  Genitourinary: Negative for frequency.  Neurological: Negative.   Psychiatric/Behavioral: Negative for self-injury and suicidal ideas. The patient is not nervous/anxious.     Echo 5/6: IMPRESSIONS    1. The left ventricle has hyperdynamic systolic function, with an ejection fraction of >65%. The cavity size was normal. Left ventricular diastolic Doppler parameters are consistent with  pseudonormalization. No evidence of left ventricular regional wall  motion abnormalities.  2. The right ventricle has normal systolic function. The cavity was normal. There is no increase in right ventricular wall thickness.  3. Left atrial size was moderately dilated.  4. The aortic root and ascending aorta are normal in size and structure.  5. The interatrial septum was not well visualized.  FINDINGS  Left Ventricle: The left ventricle has hyperdynamic systolic function, with an ejection fraction of >65%. The cavity size was normal. There is no increase in left ventricular  wall thickness. Left ventricular diastolic Doppler parameters are consistent  with pseudonormalization. No evidence of left ventricular regional wall motion abnormalities..  Right Ventricle: The right ventricle has normal systolic function. The cavity was normal. There is no increase in right ventricular wall thickness.  Left Atrium: Left atrial size was moderately dilated.  Right Atrium: Right atrial size was normal in size. Right atrial pressure is estimated at 3 mmHg.  Interatrial Septum: The interatrial septum was not well visualized.  Pericardium: There is no evidence of pericardial effusion.  Mitral Valve: The mitral valve is normal in structure. Mitral valve regurgitation is trivial by color flow Doppler.  Tricuspid Valve: The tricuspid valve is normal in structure. Tricuspid valve regurgitation is trivial by color flow Doppler.  Aortic Valve: The aortic valve is normal in structure. Aortic valve regurgitation was not visualized by color flow Doppler.  Pulmonic Valve: The pulmonic valve was grossly normal. Pulmonic valve regurgitation is not visualized by color flow Doppler.  Aorta: The aortic root and ascending aorta are normal in size and structure.  Venous: The inferior vena cava is normal in size with greater than 50% respiratory variability.  Assessment and Plan: #1 hypertension which  appears to be improved at this time based on home blood pressure readings  Plan will be for the patient to continue Benzapril and metoprolol along with furosemide  #2 alcohol dependence this appears to have improved as well  Plan will be for this patient to continue to pursue alcohol cessation  #3 alcoholic hepatitis now improved based on recent liver functions  #4 low back pain also now improved  We will have the patient follow-up to establish her primary care is and she is in need of a Pap smear    Follow Up Instructions: The patient understands the next visit will be to establish with primary care for a Pap smear   I discussed the assessment and treatment plan with the patient. The patient was provided an opportunity to ask questions and all were answered. The patient agreed with the plan and demonstrated an understanding of the instructions.   The patient was advised to call back or seek an in-person evaluation if the symptoms worsen or if the condition fails to improve as anticipated.  I provided 33minutes of non-face-to-face time during this encounter  including  median intraservice time , review of notes, labs, imaging, medications  and explaining diagnosis and management to the patient .    Asencion Noble, MD

## 2019-02-19 ENCOUNTER — Ambulatory Visit: Payer: Medicaid Other | Attending: Critical Care Medicine | Admitting: Critical Care Medicine

## 2019-02-19 ENCOUNTER — Encounter: Payer: Self-pay | Admitting: Critical Care Medicine

## 2019-02-19 ENCOUNTER — Other Ambulatory Visit: Payer: Self-pay

## 2019-02-19 DIAGNOSIS — I10 Essential (primary) hypertension: Secondary | ICD-10-CM

## 2019-02-19 DIAGNOSIS — M5441 Lumbago with sciatica, right side: Secondary | ICD-10-CM

## 2019-02-19 DIAGNOSIS — G8929 Other chronic pain: Secondary | ICD-10-CM

## 2019-02-19 DIAGNOSIS — F1029 Alcohol dependence with unspecified alcohol-induced disorder: Secondary | ICD-10-CM | POA: Diagnosis not present

## 2019-02-19 DIAGNOSIS — K7010 Alcoholic hepatitis without ascites: Secondary | ICD-10-CM

## 2019-02-19 NOTE — Progress Notes (Signed)
Patient verified DOB Patient has taken medication today. Patient has not eaten today. Patient denies pain at this time. Patient has new apt with bathroom on one level.

## 2019-03-26 ENCOUNTER — Ambulatory Visit: Payer: Medicaid Other | Admitting: Family Medicine

## 2019-03-28 ENCOUNTER — Other Ambulatory Visit: Payer: Self-pay | Admitting: Pulmonary Disease

## 2019-03-28 ENCOUNTER — Ambulatory Visit: Payer: Medicaid Other | Admitting: Family Medicine

## 2019-04-11 ENCOUNTER — Ambulatory Visit: Payer: Medicaid Other | Admitting: Family Medicine

## 2019-04-26 ENCOUNTER — Other Ambulatory Visit: Payer: Self-pay | Admitting: Podiatry

## 2019-04-26 ENCOUNTER — Other Ambulatory Visit: Payer: Self-pay | Admitting: Critical Care Medicine

## 2019-05-02 ENCOUNTER — Encounter: Payer: Self-pay | Admitting: Podiatry

## 2019-05-02 ENCOUNTER — Ambulatory Visit: Payer: Medicaid Other | Admitting: Podiatry

## 2019-05-02 ENCOUNTER — Other Ambulatory Visit: Payer: Self-pay

## 2019-05-02 DIAGNOSIS — M7751 Other enthesopathy of right foot: Secondary | ICD-10-CM

## 2019-05-02 DIAGNOSIS — M7752 Other enthesopathy of left foot: Secondary | ICD-10-CM | POA: Diagnosis not present

## 2019-05-02 DIAGNOSIS — M79676 Pain in unspecified toe(s): Secondary | ICD-10-CM

## 2019-05-02 DIAGNOSIS — M65972 Unspecified synovitis and tenosynovitis, left ankle and foot: Secondary | ICD-10-CM

## 2019-05-02 DIAGNOSIS — M65971 Unspecified synovitis and tenosynovitis, right ankle and foot: Secondary | ICD-10-CM

## 2019-05-02 DIAGNOSIS — M659 Synovitis and tenosynovitis, unspecified: Secondary | ICD-10-CM

## 2019-05-02 DIAGNOSIS — B351 Tinea unguium: Secondary | ICD-10-CM

## 2019-05-02 MED ORDER — MELOXICAM 15 MG PO TABS: 15.0000 mg | ORAL_TABLET | Freq: Every day | ORAL | 0 refills | Status: DC

## 2019-05-06 NOTE — Progress Notes (Signed)
   SUBJECTIVE 63 year old female presenting today for follow up evaluation of bilateral ankle pain. She states the pain was alleviated by the injection she had at the last appointment. She states the pain returned about one week ago. She has been taking Meloxicam for treatment of the pain. There are no modifying factors noted.  She also complains of elongated, thickened nails 1-5 noted bilaterally that cause pain while ambulating in shoes. She is unable to trim her own nails. Patient is here for further evaluation and treatment.   Past Medical History:  Diagnosis Date  . Diverticulitis   . Hypertension     OBJECTIVE General Patient is awake, alert, and oriented x 3 and in no acute distress. Derm Skin is dry and supple bilateral. Negative open lesions or macerations. Remaining integument unremarkable. Nails are tender, long, thickened and dystrophic with subungual debris, consistent with onychomycosis, 1-5 bilateral. No signs of infection noted. Vasc  DP and PT pedal pulses palpable bilaterally. Temperature gradient within normal limits.  Neuro Epicritic and protective threshold sensation grossly intact bilaterally.  Musculoskeletal Exam Pain with palpation noted to the anterior, lateral and medial aspects of the bilateral ankle joints. No symptomatic pedal deformities noted bilateral. Muscular strength within normal limits.  ASSESSMENT 1. Onychodystrophic nails 1-5 bilateral with hyperkeratosis of nails.  2. Onychomycosis of nail due to dermatophyte bilateral 3. Ankle synovitis bilateral   PLAN OF CARE 1. Patient evaluated today.  2. Instructed to maintain good pedal hygiene and foot care.  3. Mechanical debridement of nails 1-5 bilaterally performed using a nail nipper. Filed with dremel without incident.  4. Injection of 0.5 mLs Celestone Soluspan injected into the bilateral ankle joints.  5. Prescription for Meloxicam provided to patient. 6. Return to clinic in 3 mos.    Edrick Kins, DPM [facility]  Dr. Edrick Kins, [address]                                        [place], [place] [address]                Office [phone]  Fax [phone]

## 2019-05-10 ENCOUNTER — Ambulatory Visit: Payer: Medicaid Other | Admitting: Family Medicine

## 2019-06-15 ENCOUNTER — Encounter: Payer: Self-pay | Admitting: Family Medicine

## 2019-06-15 ENCOUNTER — Ambulatory Visit: Payer: Medicaid Other | Attending: Family Medicine | Admitting: Family Medicine

## 2019-06-15 ENCOUNTER — Other Ambulatory Visit: Payer: Self-pay

## 2019-06-15 VITALS — BP 169/87 | HR 79 | Temp 98.2°F | Resp 18 | Ht 61.0 in | Wt 306.0 lb

## 2019-06-15 DIAGNOSIS — Z23 Encounter for immunization: Secondary | ICD-10-CM

## 2019-06-15 DIAGNOSIS — I10 Essential (primary) hypertension: Secondary | ICD-10-CM

## 2019-06-15 DIAGNOSIS — M5441 Lumbago with sciatica, right side: Secondary | ICD-10-CM | POA: Diagnosis not present

## 2019-06-15 DIAGNOSIS — Z79899 Other long term (current) drug therapy: Secondary | ICD-10-CM | POA: Diagnosis not present

## 2019-06-15 DIAGNOSIS — R748 Abnormal levels of other serum enzymes: Secondary | ICD-10-CM

## 2019-06-15 DIAGNOSIS — Z7982 Long term (current) use of aspirin: Secondary | ICD-10-CM | POA: Diagnosis not present

## 2019-06-15 DIAGNOSIS — Z8249 Family history of ischemic heart disease and other diseases of the circulatory system: Secondary | ICD-10-CM | POA: Insufficient documentation

## 2019-06-15 DIAGNOSIS — F1010 Alcohol abuse, uncomplicated: Secondary | ICD-10-CM | POA: Insufficient documentation

## 2019-06-15 DIAGNOSIS — G8929 Other chronic pain: Secondary | ICD-10-CM | POA: Diagnosis not present

## 2019-06-15 DIAGNOSIS — Z6841 Body Mass Index (BMI) 40.0 and over, adult: Secondary | ICD-10-CM

## 2019-06-15 DIAGNOSIS — Z791 Long term (current) use of non-steroidal anti-inflammatories (NSAID): Secondary | ICD-10-CM | POA: Diagnosis not present

## 2019-06-15 MED ORDER — BENAZEPRIL HCL 20 MG PO TABS: 20.0000 mg | ORAL_TABLET | Freq: Every day | ORAL | 1 refills | Status: DC

## 2019-06-15 NOTE — Progress Notes (Signed)
Established Patient Office Visit  Subjective:  Patient ID: [name], female    DOB: Jan 10, 1956  Age: 63 y.o. MRN: [id number]  CC:  Chief Complaint  Patient presents with  . Establish Care    HPI Bowie Doiron presents for ongoing management of chronic medical issues. She was last seen 02/19/2019 by another provider  at this office for a telehealth visit.  She reports that she feels that she has been doing well since that time other than recurrent issues with low back pain which radiates down her right leg.  Patient reports that she has now moved into a 1 level home since she was last seen at the office.  She reports that this has helped and she does have chronic issues with knee pain as well as her back pain.  She reports that her left ankle pain which occurred after a fall in March for which she was seen in the ED has now resolved.  Patient states that she also found out at her emergency department visit that her liver enzymes were elevated and patient states that she has decreased and mostly stopped alcohol use since that time.  She denies any current abdominal pain.  She has had no blood in the stool and no black stools.        She reports that she continues to take her blood pressure medications and has had no headaches or dizziness related to her blood pressure.  She does not believe that she is having any issues with swelling in her lower legs at this time.  She states that her lower legs are just fat.  She denies any chest pain or palpitations.  No shortness of breath at rest or with lying down but she does get short of breath with activity which she believes is due to being fat.        She does continue to take Lasix.  She does have a prescription for medication for her back and knee pain but often instead takes over-the-counter nonsteroidal anti-inflammatories.  She reports that her knee pain and back pain are worse with walking.  Patient states that the radiation of pain in her right  leg stops just above her knee.  She has had longstanding issues off and on with low back pain with radiation but she feels that this did worsen after her fall in March.  She reports that she gets a stabbing/catching sensation in her right low back/buttock that is about a 8 on a 0-to-10 scale with similar pain that radiates down her leg.  Past Medical History:  Diagnosis Date  . Diverticulitis   . Hypertension     Past Surgical History:  Procedure Laterality Date  . TUBAL LIGATION      Family History  Problem Relation Age of Onset  . Hypertension Mother     Social History   Socioeconomic History  . Marital status: Legally Separated    Spouse name: Not on file  . Number of children: Not on file  . Years of education: Not on file  . Highest education level: Not on file  Occupational History  . Not on file  Social Needs  . Financial resource strain: Not on file  . Food insecurity    Worry: Not on file    Inability: Not on file  . Transportation needs    Medical: Not on file    Non-medical: Not on file  Tobacco Use  . Smoking status: Never Smoker  . Smokeless  tobacco: Never Used  Substance and Sexual Activity  . Alcohol use: Yes    Alcohol/week: 10.0 standard drinks    Types: 10 Shots of liquor per week    Comment: EOD-weekends  . Drug use: No  . Sexual activity: Not Currently  Lifestyle  . Physical activity    Days per week: Not on file    Minutes per session: Not on file  . Stress: Not on file  Relationships  . Social Herbalist on phone: Not on file    Gets together: Not on file    Attends religious service: Not on file    Active member of club or organization: Not on file    Attends meetings of clubs or organizations: Not on file    Relationship status: Not on file  . Intimate partner violence    Fear of current or ex partner: Not on file    Emotionally abused: Not on file    Physically abused: Not on file    Forced sexual activity: Not on file    Other Topics Concern  . Not on file  Social History Narrative  . Not on file    Outpatient Medications Prior to Visit  Medication Sig Dispense Refill  . Blood Pressure Monitoring ([employer] ADVANCED BP MONITOR) DEVI Measure blood pressure daily and record in diary 1 Device 0  . furosemide (LASIX) 40 MG tablet Take 1 tablet (40 mg total) by mouth daily. 30 tablet 6  . meloxicam (MOBIC) 15 MG tablet Take 1 tablet (15 mg total) by mouth daily. 30 tablet 0  . metoprolol tartrate (LOPRESSOR) 50 MG tablet Take 1 tablet (50 mg total) by mouth 2 (two) times daily. 60 tablet 6  . Misc Natural Products (LAXATIVE FORMULA) TABS Take 1 tablet by mouth once.    . Multiple Vitamins-Minerals (CENTRUM SILVER ADULT 50+) TABS Take 1 tablet by mouth daily.    . potassium chloride SA (K-DUR) 20 MEQ tablet Take 1 tablet (20 mEq total) by mouth daily. 30 tablet 3  . benazepril (LOTENSIN) 10 MG tablet TAKE 1 TABLET BY MOUTH DAILY. FOR HYPENTESION 90 tablet 0   No facility-administered medications prior to visit.     No Known Allergies  ROS Review of Systems  Constitutional: Positive for fatigue. Negative for chills and fever.  HENT: Negative for sore throat and trouble swallowing.   Respiratory: Positive for shortness of breath (Only with activity which patient attributes to being fat"). Negative for cough.   Cardiovascular: Negative for chest pain, palpitations and leg swelling.  Gastrointestinal: Negative for abdominal pain, blood in stool, constipation, diarrhea and nausea.  Endocrine: Negative for cold intolerance, heat intolerance, polydipsia, polyphagia and polyuria.  Musculoskeletal: Positive for arthralgias, back pain and gait problem.  Neurological: Negative for dizziness and headaches.  Hematological: Negative for adenopathy. Does not bruise/bleed easily.      Objective:    Physical Exam  Constitutional: She is oriented to person, place, and time. She appears well-developed and well-nourished.  No distress.  Well-nourished well-developed morbidly obese female in no acute distress.  Exam performed while patient sitting in exam chair  Neck: Normal range of motion. No JVD present. No thyromegaly present.  Cardiovascular: Normal rate and regular rhythm.  Possible slight murmur on cardiac exam  Pulmonary/Chest: Effort normal and breath sounds normal.  Abdominal: Soft. There is no abdominal tenderness. There is no rebound and no guarding.  Truncal obesity  Musculoskeletal:        General:  Tenderness present. No edema.     Comments: Patient with right SI joint discomfort and positive seated leg raise.  Patient with limitation in ability to extend and flex the hip/knees secondary to her obesity.  Patient with bilateral joint line tenderness of both knees  Lymphadenopathy:    She has no cervical adenopathy.  Neurological: She is alert and oriented to person, place, and time.  Skin: Skin is warm and dry.  Psychiatric: She has a normal mood and affect. Her behavior is normal.  Nursing note and vitals reviewed.   BP (!) 169/87 (BP Location: Right Arm, Patient Position: Sitting, Cuff Size: Large)   Pulse 79   Temp 98.2 F (36.8 C) (Oral)   Resp 18   Ht 5\' 1"  (1.549 m)   Wt (!) 306 lb (138.8 kg)   BMI 57.82 kg/m  Wt Readings from Last 3 Encounters:  06/15/19 (!) 306 lb (138.8 kg)  01/10/19 (!) 323 lb 6.4 oz (146.7 kg)  11/20/18 (!) 303 lb 6.4 oz (137.6 kg)     Health Maintenance Due  Topic Date Due  . COLONOSCOPY  08/17/2005  . PAP SMEAR-Modifier  01/26/2009  . MAMMOGRAM  05/23/2010     Lab Results  Component Value Date   TSH 3.360 11/20/2018   Lab Results  Component Value Date   WBC 6.7 06/15/2019   HGB 11.5 06/15/2019   HCT 33.3 (L) 06/15/2019   MCV 97 06/15/2019   PLT 265 06/15/2019   Lab Results  Component Value Date   NA 143 06/15/2019   K 4.5 06/15/2019   CO2 26 06/15/2019   GLUCOSE 104 (H) 06/15/2019   BUN 29 (H) 06/15/2019   CREATININE 1.30 (H)  06/15/2019   BILITOT 0.4 06/15/2019   ALKPHOS 54 06/15/2019   AST 59 (H) 06/15/2019   ALT 45 (H) 06/15/2019   PROT 8.5 06/15/2019   ALBUMIN 4.3 06/15/2019   CALCIUM 9.6 06/15/2019   ANIONGAP 19 (H) 11/03/2018   Lab Results  Component Value Date   CHOL 232 (H) 05/17/2008   Lab Results  Component Value Date   HDL 88 05/17/2008   Lab Results  Component Value Date   LDLCALC 133 (H) 05/17/2008   Lab Results  Component Value Date   TRIG 54 05/17/2008   Lab Results  Component Value Date   CHOLHDL 2.6 Ratio 05/17/2008   No results found for: HGBA1C    Assessment & Plan:  1. Essential hypertension; 6.  Morbid obesity with BMI 50-50 0.9 Patient's blood pressure remained elevated at today's visit and patient's benazepril will be increased from 10 mg to 20 mg daily.  Patient will have electrolytes/creatinine checked as part of her CMP at today's visit.  Low-sodium/Dash type diet encouraged.  Patient unfortunately is limited in her ability to exercise due to her back and knee pain.  Dietary changes with goal of weight loss were encouraged as any weight loss may help improve her control of her blood pressure.  Discussed goal of having blood pressure lower than 140/90. - Comprehensive metabolic panel - benazepril (LOTENSIN) 20 MG tablet; Take 1 tablet (20 mg total) by mouth daily. To lower blood pressure  Dispense: 90 tablet; Refill: 1  2. Elevated liver enzymes Patient has had prior elevated liver enzymes on review of chart.  An abdominal ultrasound was previously ordered and pending and patient is encouraged to have this performed.  She will have repeat LFTs at today's visit and further evaluation will be done based on  continued changes in liver enzymes.  Patient also with history of alcohol abuse but states that she has decreased alcohol intake and does not drink alcohol daily.  Discussed with patient that her alcohol use may be the cause or contributing factor in her elevated liver  enzymes.  Also she likely has issues with fatty infiltration of the liver. - Comprehensive metabolic panel  3. Chronic bilateral low back pain with right-sided sciatica She reports chronic issues with bilateral knee pain as well as chronic low back pain with radiation down the right leg.  She feels that her back pain and radiation of pain down her leg have worsened since her fall in March.  Patient is being referred to orthopedics for further evaluation and treatment. - AMB referral to orthopedics  4. Long term (current) use of aspirin Patient reports use of aspirin and nonsteroidal anti-inflammatories for treatment of pain.  Patient was encouraged to avoid taking both types of medications if possible and/or limiting aspirin to 81 mg daily.  Patient will have CBC to look for anemia secondary to patient's long-term use of nonsteroidal anti-inflammatories and aspirin therapy.  Patient will also have comprehensive metabolic panel to check renal function due to her long-term aspirin/NSAID use - Comprehensive metabolic panel - CBC with Differential  5. Need for immunization against influenza Patient was offered and agreed to have influenza immunization at today's visit.  She also received educational material regarding influenza immunization. - Flu Vaccine QUAD 6+ mos PF IM (Fluarix Quad PF)    Meds ordered this encounter  Medications  . benazepril (LOTENSIN) 20 MG tablet    Sig: Take 1 tablet (20 mg total) by mouth daily. To lower blood pressure    Dispense:  90 tablet    Refill:  1    Dose increase    Follow-up: 4 to 6-week follow-up of hypertension and patient encouraged to have blood pressure checked at home or at local pharmacy/fire station as well  [name], MD

## 2019-06-15 NOTE — Patient Instructions (Signed)
Hypertension, Adult Hypertension is another name for high blood pressure. High blood pressure forces your heart to work harder to pump blood. This can cause problems over time. There are two numbers in a blood pressure reading. There is a top number (systolic) over a bottom number (diastolic). It is best to have a blood pressure that is below 120/80. Healthy choices can help lower your blood pressure, or you may need medicine to help lower it. What are the causes? The cause of this condition is not known. Some conditions may be related to high blood pressure. What increases the risk?  Smoking.  Having type 2 diabetes mellitus, high cholesterol, or both.  Not getting enough exercise or physical activity.  Being overweight.  Having too much fat, sugar, calories, or salt (sodium) in your diet.  Drinking too much alcohol.  Having long-term (chronic) kidney disease.  Having a family history of high blood pressure.  Age. Risk increases with age.  Race. You may be at higher risk if you are African American.  Gender. Men are at higher risk than women before age 45. After age 65, women are at higher risk than men.  Having obstructive sleep apnea.  Stress. What are the signs or symptoms?  High blood pressure may not cause symptoms. Very high blood pressure (hypertensive crisis) may cause: ? Headache. ? Feelings of worry or nervousness (anxiety). ? Shortness of breath. ? Nosebleed. ? A feeling of being sick to your stomach (nausea). ? Throwing up (vomiting). ? Changes in how you see. ? Very bad chest pain. ? Seizures. How is this treated?  This condition is treated by making healthy lifestyle changes, such as: ? Eating healthy foods. ? Exercising more. ? Drinking less alcohol.  Your health care provider may prescribe medicine if lifestyle changes are not enough to get your blood pressure under control, and if: ? Your top number is above 130. ? Your bottom number is above 80.   Your personal target blood pressure may vary. Follow these instructions at home: Eating and drinking   If told, follow the DASH eating plan. To follow this plan: ? Fill one half of your plate at each meal with fruits and vegetables. ? Fill one fourth of your plate at each meal with whole grains. Whole grains include whole-wheat pasta, brown rice, and whole-grain bread. ? Eat or drink low-fat dairy products, such as skim milk or low-fat yogurt. ? Fill one fourth of your plate at each meal with low-fat (lean) proteins. Low-fat proteins include fish, chicken without skin, eggs, beans, and tofu. ? Avoid fatty meat, cured and processed meat, or chicken with skin. ? Avoid pre-made or processed food.  Eat less than 1,500 mg of salt each day.  Do not drink alcohol if: ? Your doctor tells you not to drink. ? You are pregnant, may be pregnant, or are planning to become pregnant.  If you drink alcohol: ? Limit how much you use to:  0-1 drink a day for women.  0-2 drinks a day for men. ? Be aware of how much alcohol is in your drink. In the [place] one drink equals one 12 oz bottle of beer (355 mL), one 5 oz glass of wine (148 mL), or one 1 oz glass of hard liquor (44 mL). Lifestyle   Work with your doctor to stay at a healthy weight or to lose weight. Ask your doctor what the best weight is for you.  Get at least 30 minutes of exercise most   days of the week. This may include walking, swimming, or biking.  Get at least 30 minutes of exercise that strengthens your muscles (resistance exercise) at least 3 days a week. This may include lifting weights or doing Pilates.  Do not use any products that contain nicotine or tobacco, such as cigarettes, e-cigarettes, and chewing tobacco. If you need help quitting, ask your doctor.  Check your blood pressure at home as told by your doctor.  Keep all follow-up visits as told by your doctor. This is important. Medicines  Take over-the-counter and  prescription medicines only as told by your doctor. Follow directions carefully.  Do not skip doses of blood pressure medicine. The medicine does not work as well if you skip doses. Skipping doses also puts you at risk for problems.  Ask your doctor about side effects or reactions to medicines that you should watch for. Contact a doctor if you:  Think you are having a reaction to the medicine you are taking.  Have headaches that keep coming back (recurring).  Feel dizzy.  Have swelling in your ankles.  Have trouble with your vision. Get help right away if you:  Get a very bad headache.  Start to feel mixed up (confused).  Feel weak or numb.  Feel faint.  Have very bad pain in your: ? Chest. ? Belly (abdomen).  Throw up more than once.  Have trouble breathing. Summary  Hypertension is another name for high blood pressure.  High blood pressure forces your heart to work harder to pump blood.  For most people, a normal blood pressure is less than 120/80.  Making healthy choices can help lower blood pressure. If your blood pressure does not get lower with healthy choices, you may need to take medicine. This information is not intended to replace advice given to you by your health care provider. Make sure you discuss any questions you have with your health care provider. Document Released: 02/02/2008 Document Revised: 04/26/2018 Document Reviewed: 04/26/2018 Elsevier Patient Education  2020 [place].  Sciatica  Sciatica is pain, weakness, tingling, or loss of feeling (numbness) along the sciatic nerve. The sciatic nerve starts in the lower back and goes down the back of each leg. Sciatica usually goes away on its own or with treatment. Sometimes, sciatica may come back (recur). What are the causes? This condition happens when the sciatic nerve is pinched or has pressure put on it. This may be the result of:  A disk in between the bones of the spine bulging out too  far (herniated disk).  Changes in the spinal disks that occur with aging.  A condition that affects a muscle in the butt.  Extra bone growth near the sciatic nerve.  A break (fracture) of the area between your hip bones (pelvis).  Pregnancy.  Tumor. This is rare. What increases the risk? You are more likely to develop this condition if you:  Play sports that put pressure or stress on the spine.  Have poor strength and ease of movement (flexibility).  Have had a back injury in the past.  Have had back surgery.  Sit for long periods of time.  Do activities that involve bending or lifting over and over again.  Are very overweight (obese). What are the signs or symptoms? Symptoms can vary from mild to very bad. They may include:  Any of these problems in the lower back, leg, hip, or butt: ? Mild tingling, loss of feeling, or dull aches. ? Burning sensations. ?  Sharp pains.  Loss of feeling in the back of the calf or the sole of the foot.  Leg weakness.  Very bad back pain that makes it hard to move. These symptoms may get worse when you cough, sneeze, or laugh. They may also get worse when you sit or stand for long periods of time. How is this treated? This condition often gets better without any treatment. However, treatment may include:  Changing or cutting back on physical activity when you have pain.  Doing exercises and stretching.  Putting ice or heat on the affected area.  Medicines that help: ? To relieve pain and swelling. ? To relax your muscles.  Shots (injections) of medicines that help to relieve pain, irritation, and swelling.  Surgery. Follow these instructions at home: Medicines  Take over-the-counter and prescription medicines only as told by your doctor.  Ask your doctor if the medicine prescribed to you: ? Requires you to avoid driving or using heavy machinery. ? Can cause trouble pooping (constipation). You may need to take these steps  to prevent or treat trouble pooping:  Drink enough fluids to keep your pee (urine) pale yellow.  Take over-the-counter or prescription medicines.  Eat foods that are high in fiber. These include beans, whole grains, and fresh fruits and vegetables.  Limit foods that are high in fat and sugar. These include fried or sweet foods. Managing pain      If told, put ice on the affected area. ? Put ice in a plastic bag. ? Place a towel between your skin and the bag. ? Leave the ice on for 20 minutes, 2-3 times a day.  If told, put heat on the affected area. Use the heat source that your doctor tells you to use, such as a moist heat pack or a heating pad. ? Place a towel between your skin and the heat source. ? Leave the heat on for 20-30 minutes. ? Remove the heat if your skin turns bright red. This is very important if you are unable to feel pain, heat, or cold. You may have a greater risk of getting burned. Activity   Return to your normal activities as told by your doctor. Ask your doctor what activities are safe for you.  Avoid activities that make your symptoms worse.  Take short rests during the day. ? When you rest for a long time, do some physical activity or stretching between periods of rest. ? Avoid sitting for a long time without moving. Get up and move around at least one time each hour.  Exercise and stretch regularly, as told by your doctor.  Do not lift anything that is heavier than 10 lb (4.5 kg) while you have symptoms of sciatica. ? Avoid lifting heavy things even when you do not have symptoms. ? Avoid lifting heavy things over and over.  When you lift objects, always lift in a way that is safe for your body. To do this, you should: ? Bend your knees. ? Keep the object close to your body. ? Avoid twisting. General instructions  Stay at a healthy weight.  Wear comfortable shoes that support your feet. Avoid wearing high heels.  Avoid sleeping on a mattress  that is too soft or too hard. You might have less pain if you sleep on a mattress that is firm enough to support your back.  Keep all follow-up visits as told by your doctor. This is important. Contact a doctor if:  You have pain that: ?  Wakes you up when you are sleeping. ? Gets worse when you lie down. ? Is worse than the pain you have had in the past. ? Lasts longer than 4 weeks.  You lose weight without trying. Get help right away if:  You cannot control when you pee (urinate) or poop (have a bowel movement).  You have weakness in any of these areas and it gets worse: ? Lower back. ? The area between your hip bones. ? Butt. ? Legs.  You have redness or swelling of your back.  You have a burning feeling when you pee. Summary  Sciatica is pain, weakness, tingling, or loss of feeling (numbness) along the sciatic nerve.  This condition happens when the sciatic nerve is pinched or has pressure put on it.  Sciatica can cause pain, tingling, or loss of feeling (numbness) in the lower back, legs, hips, and butt.  Treatment often includes rest, exercise, medicines, and putting ice or heat on the affected area. This information is not intended to replace advice given to you by your health care provider. Make sure you discuss any questions you have with your health care provider. Document Released: 05/25/2008 Document Revised: 09/04/2018 Document Reviewed: 09/04/2018 Elsevier Patient Education  [place].

## 2019-06-16 LAB — CBC WITH DIFFERENTIAL/PLATELET
Basophils Absolute: 0 x10E3/uL (ref 0.0–0.2)
Basos: 0 %
EOS (ABSOLUTE): 0.2 x10E3/uL (ref 0.0–0.4)
Eos: 3 %
Hematocrit: 33.3 % — ABNORMAL LOW (ref 34.0–46.6)
Hemoglobin: 11.5 g/dL (ref 11.1–15.9)
Immature Grans (Abs): 0 x10E3/uL (ref 0.0–0.1)
Immature Granulocytes: 0 %
Lymphocytes Absolute: 2 x10E3/uL (ref 0.7–3.1)
Lymphs: 30 %
MCH: 33.5 pg — ABNORMAL HIGH (ref 26.6–33.0)
MCHC: 34.5 g/dL (ref 31.5–35.7)
MCV: 97 fL (ref 79–97)
Monocytes Absolute: 0.7 x10E3/uL (ref 0.1–0.9)
Monocytes: 10 %
Neutrophils Absolute: 3.8 x10E3/uL (ref 1.4–7.0)
Neutrophils: 57 %
Platelets: 265 x10E3/uL (ref 150–450)
RBC: 3.43 x10E6/uL — ABNORMAL LOW (ref 3.77–5.28)
RDW: 12.4 % (ref 11.7–15.4)
WBC: 6.7 x10E3/uL (ref 3.4–10.8)

## 2019-06-16 LAB — COMPREHENSIVE METABOLIC PANEL WITH GFR
ALT: 45 IU/L — ABNORMAL HIGH (ref 0–32)
AST: 59 IU/L — ABNORMAL HIGH (ref 0–40)
Albumin/Globulin Ratio: 1 — ABNORMAL LOW (ref 1.2–2.2)
Albumin: 4.3 g/dL (ref 3.8–4.8)
Alkaline Phosphatase: 54 IU/L (ref 39–117)
BUN/Creatinine Ratio: 22 (ref 12–28)
BUN: 29 mg/dL — ABNORMAL HIGH (ref 8–27)
Bilirubin Total: 0.4 mg/dL (ref 0.0–1.2)
CO2: 26 mmol/L (ref 20–29)
Calcium: 9.6 mg/dL (ref 8.7–10.3)
Chloride: 100 mmol/L (ref 96–106)
Creatinine, Ser: 1.3 mg/dL — ABNORMAL HIGH (ref 0.57–1.00)
GFR calc Af Amer: 50 mL/min/1.73 — ABNORMAL LOW
GFR calc non Af Amer: 44 mL/min/1.73 — ABNORMAL LOW
Globulin, Total: 4.2 g/dL (ref 1.5–4.5)
Glucose: 104 mg/dL — ABNORMAL HIGH (ref 65–99)
Potassium: 4.5 mmol/L (ref 3.5–5.2)
Sodium: 143 mmol/L (ref 134–144)
Total Protein: 8.5 g/dL (ref 6.0–8.5)

## 2019-06-17 ENCOUNTER — Other Ambulatory Visit: Payer: Self-pay | Admitting: Family Medicine

## 2019-06-17 ENCOUNTER — Emergency Department (HOSPITAL_COMMUNITY)
Admission: EM | Admit: 2019-06-17 | Discharge: 2019-06-17 | Disposition: A | Discharge status: Home / Self Care | Payer: Medicaid Other | Attending: Emergency Medicine | Admitting: Emergency Medicine

## 2019-06-17 ENCOUNTER — Encounter (HOSPITAL_COMMUNITY): Payer: Self-pay | Admitting: Emergency Medicine

## 2019-06-17 ENCOUNTER — Emergency Department (HOSPITAL_COMMUNITY): Payer: Medicaid Other

## 2019-06-17 ENCOUNTER — Encounter: Payer: Self-pay | Admitting: Family Medicine

## 2019-06-17 DIAGNOSIS — S79911A Unspecified injury of right hip, initial encounter: Secondary | ICD-10-CM | POA: Diagnosis not present

## 2019-06-17 DIAGNOSIS — M25551 Pain in right hip: Secondary | ICD-10-CM | POA: Diagnosis not present

## 2019-06-17 DIAGNOSIS — Z79899 Other long term (current) drug therapy: Secondary | ICD-10-CM | POA: Insufficient documentation

## 2019-06-17 DIAGNOSIS — Y9389 Activity, other specified: Secondary | ICD-10-CM | POA: Insufficient documentation

## 2019-06-17 DIAGNOSIS — S8991XA Unspecified injury of right lower leg, initial encounter: Secondary | ICD-10-CM | POA: Diagnosis not present

## 2019-06-17 DIAGNOSIS — S52124A Nondisplaced fracture of head of right radius, initial encounter for closed fracture: Secondary | ICD-10-CM | POA: Insufficient documentation

## 2019-06-17 DIAGNOSIS — T148XXA Other injury of unspecified body region, initial encounter: Secondary | ICD-10-CM | POA: Diagnosis not present

## 2019-06-17 DIAGNOSIS — M25561 Pain in right knee: Secondary | ICD-10-CM | POA: Diagnosis not present

## 2019-06-17 DIAGNOSIS — Y999 Unspecified external cause status: Secondary | ICD-10-CM | POA: Insufficient documentation

## 2019-06-17 DIAGNOSIS — Y929 Unspecified place or not applicable: Secondary | ICD-10-CM | POA: Diagnosis not present

## 2019-06-17 DIAGNOSIS — W010XXA Fall on same level from slipping, tripping and stumbling without subsequent striking against object, initial encounter: Secondary | ICD-10-CM | POA: Insufficient documentation

## 2019-06-17 DIAGNOSIS — R52 Pain, unspecified: Secondary | ICD-10-CM | POA: Diagnosis not present

## 2019-06-17 DIAGNOSIS — I10 Essential (primary) hypertension: Secondary | ICD-10-CM | POA: Insufficient documentation

## 2019-06-17 DIAGNOSIS — S52121A Displaced fracture of head of right radius, initial encounter for closed fracture: Secondary | ICD-10-CM | POA: Diagnosis not present

## 2019-06-17 DIAGNOSIS — M79603 Pain in arm, unspecified: Secondary | ICD-10-CM | POA: Diagnosis not present

## 2019-06-17 DIAGNOSIS — W19XXXA Unspecified fall, initial encounter: Secondary | ICD-10-CM

## 2019-06-17 DIAGNOSIS — S80811A Abrasion, right lower leg, initial encounter: Secondary | ICD-10-CM | POA: Diagnosis not present

## 2019-06-17 DIAGNOSIS — R7989 Other specified abnormal findings of blood chemistry: Secondary | ICD-10-CM

## 2019-06-17 IMAGING — CR DG FOREARM 2V*R*
4 series · 4 of 4 positions shown · non-contrast
Comparison: Right hand radiograph dated [DATE]

CLINICAL DATA: 62-year-old female with fall and trauma to the right
forearm.

EXAM:
RIGHT FOREARM - 2 VIEW

[x forearm ap right (1 of 2)]
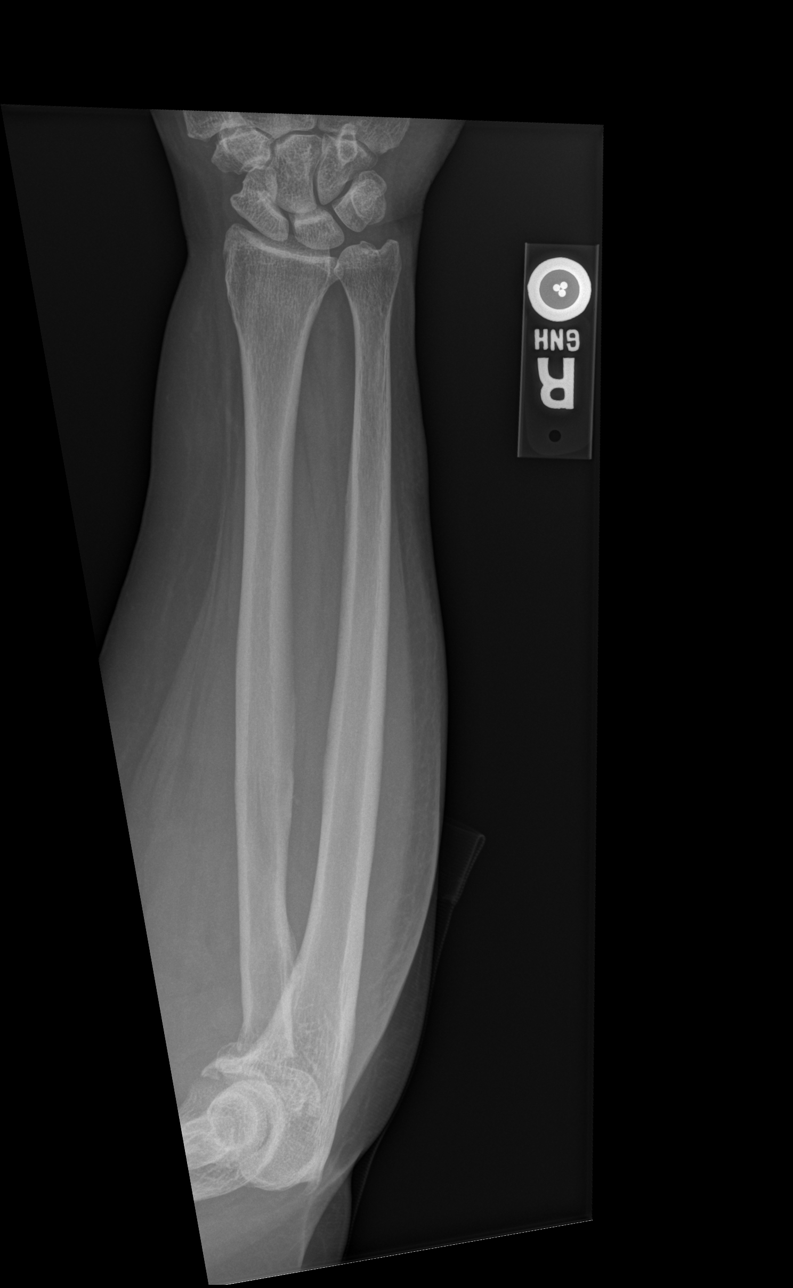

[x forearm lat right]
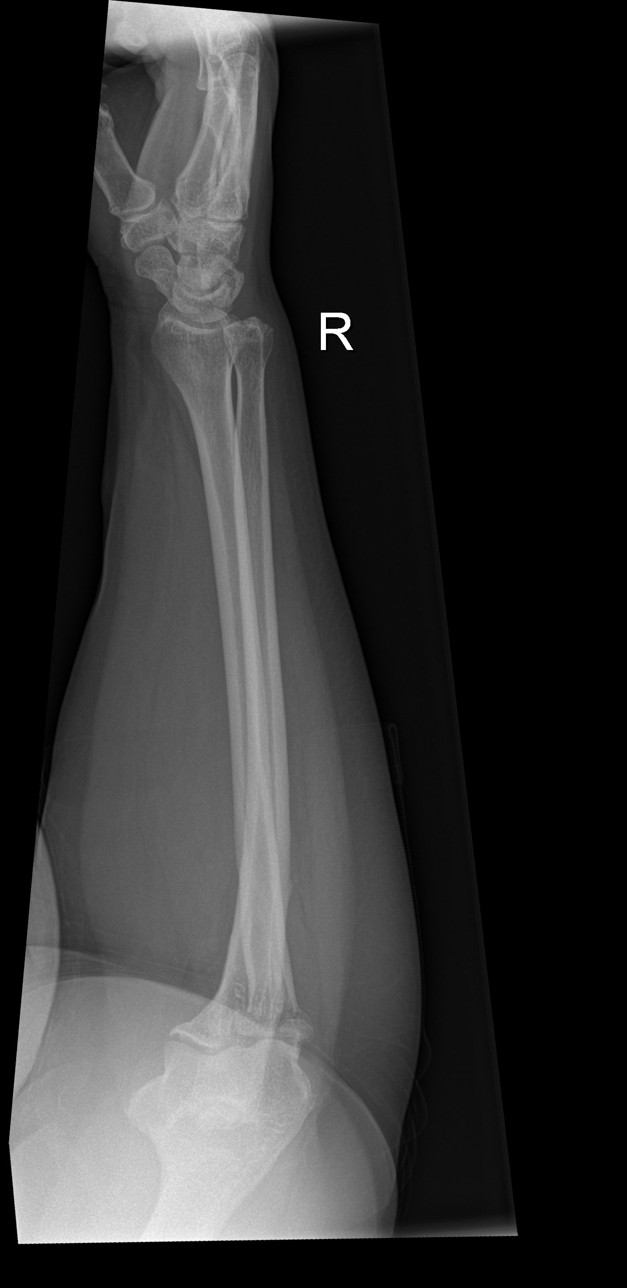

[x forearm ap right (2 of 2)]
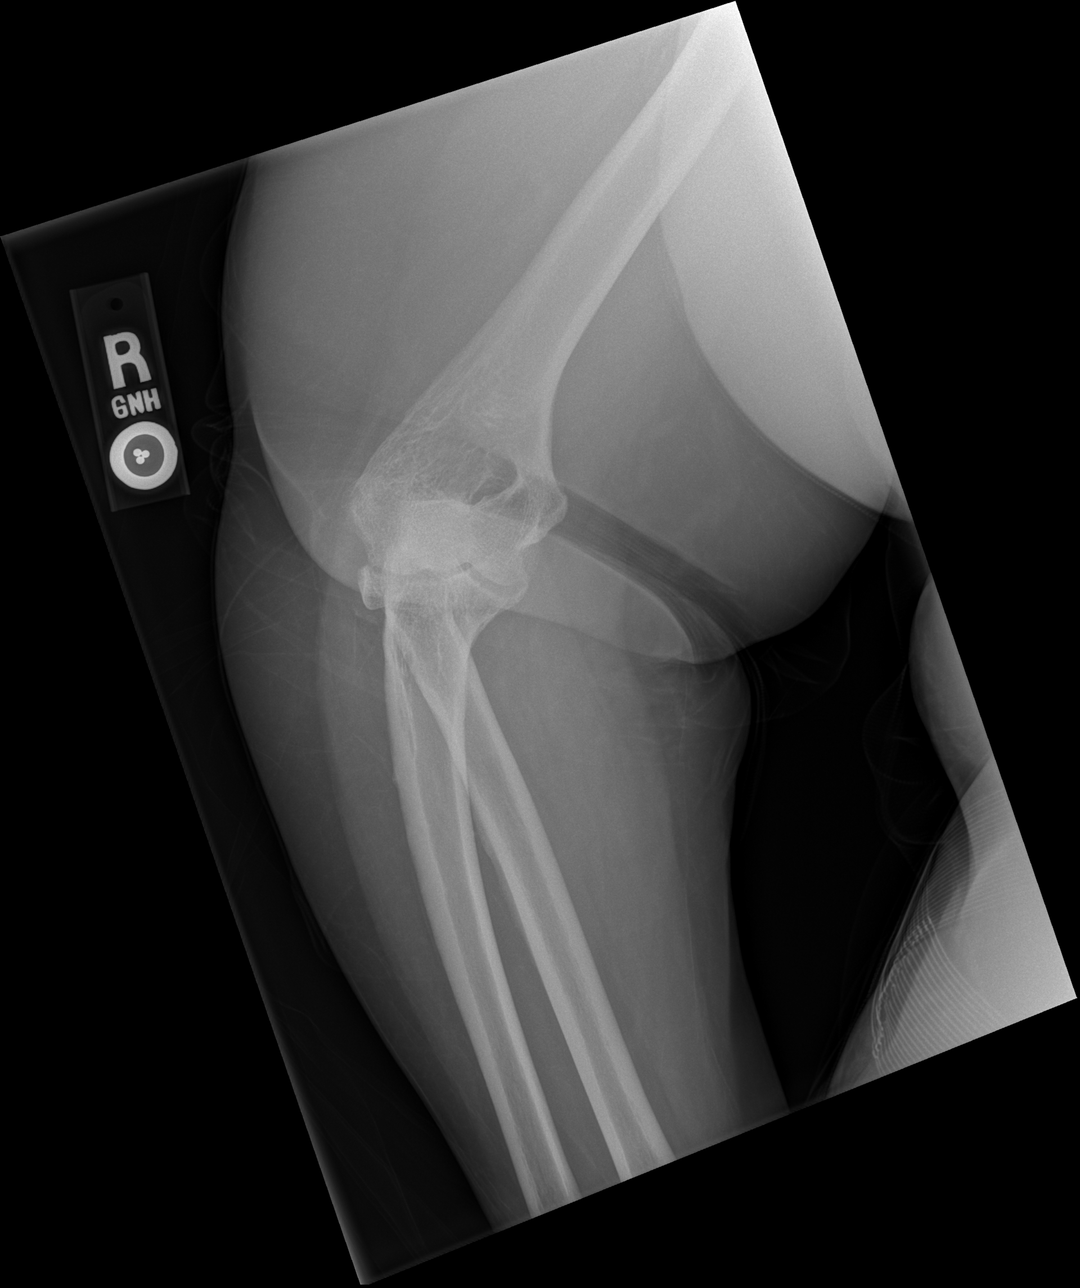

[x forearm right 0-3yrs]
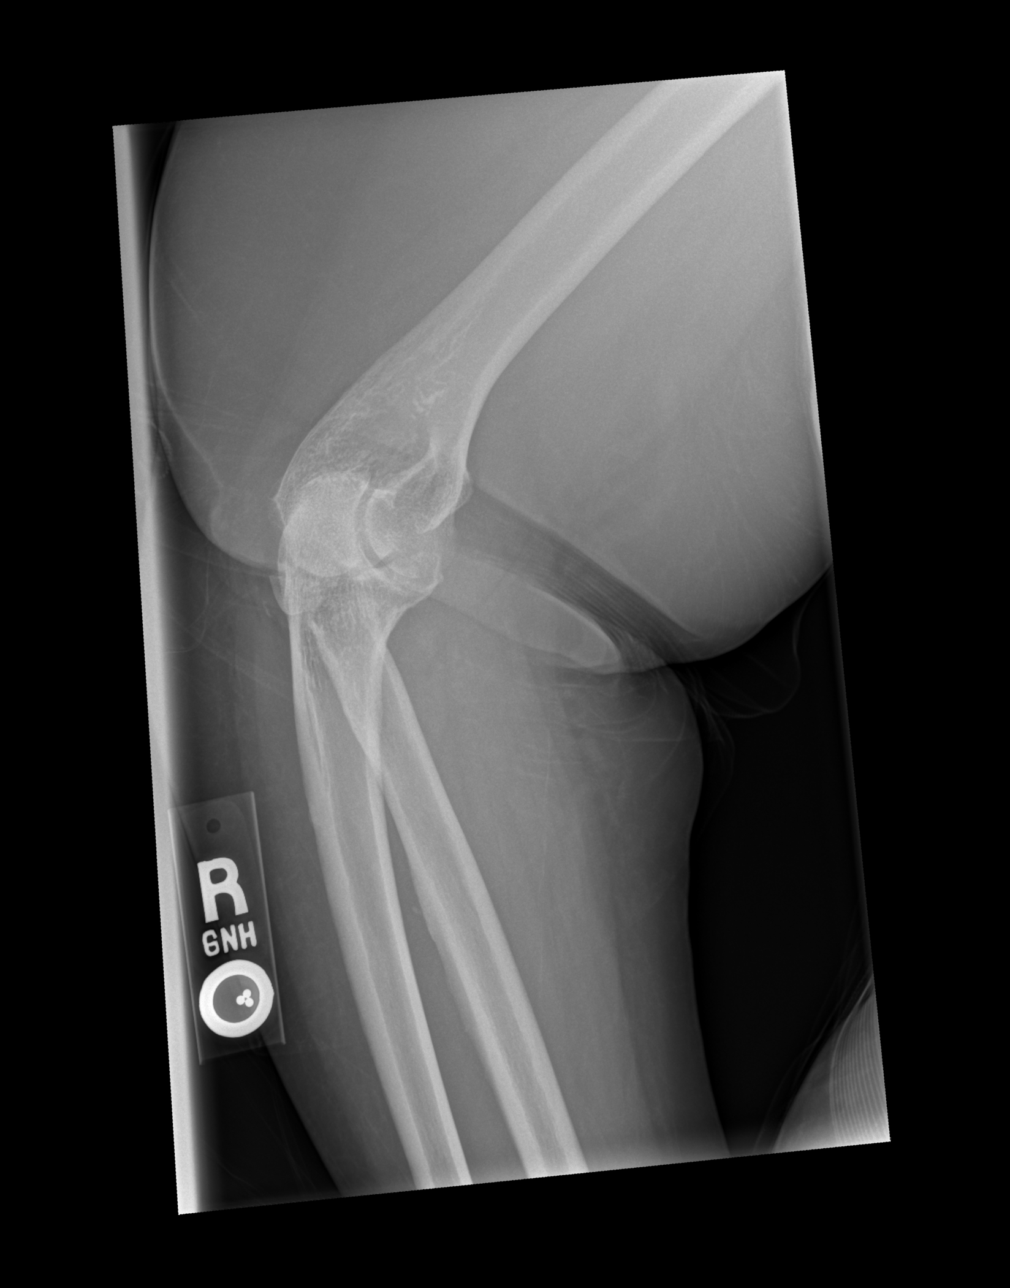

[4 of 4 positions shown; findings below may reference images not displayed]

FINDINGS: Evaluation of the elbow is limited due to positioning. There is a
fracture of the radial head or neck. No definite other acute
fracture identified. There is no dislocation. The bones are mildly
osteopenic. The soft tissues are unremarkable.
IMPRESSION: 1. Fracture of the radial head or neck. No dislocation.
2. No definite other acute fracture.

## 2019-06-17 IMAGING — CR DG KNEE COMPLETE 4+V*R*
4 series · 4 of 4 positions shown · non-contrast
Comparison: CT of the abdomen pelvis dated [DATE]

CLINICAL DATA: 62-year-old female with fall and right lower
extremity pain.

EXAM:
RIGHT KNEE - COMPLETE 4+ VIEW; DG HIP (WITH OR WITHOUT PELVIS) 2-3V
RIGHT

[t knee obl right (1 of 2)]
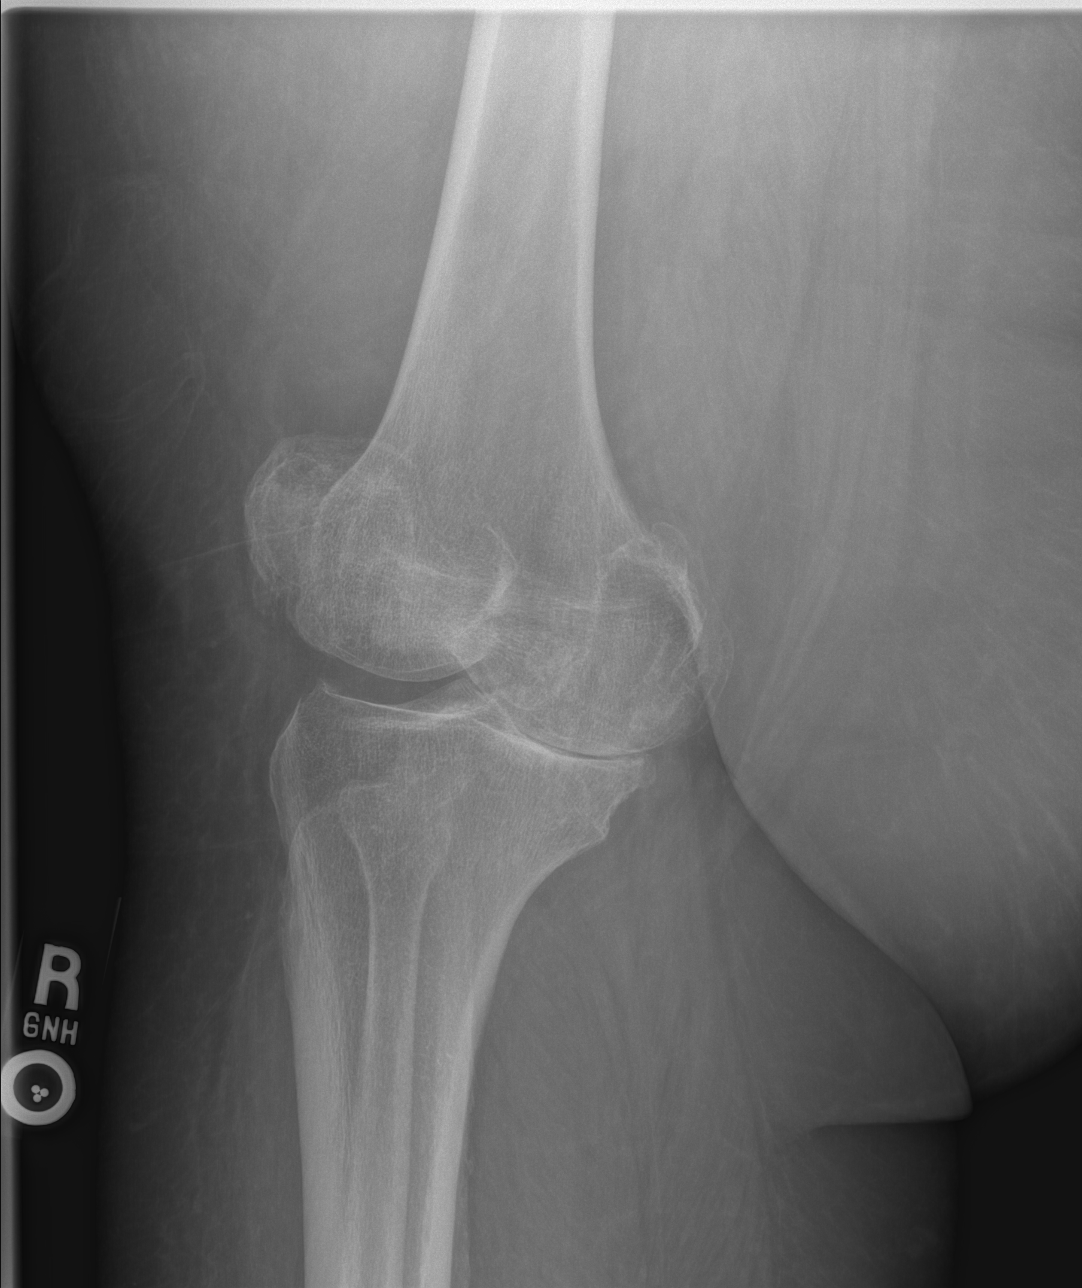

[t knee ap right]
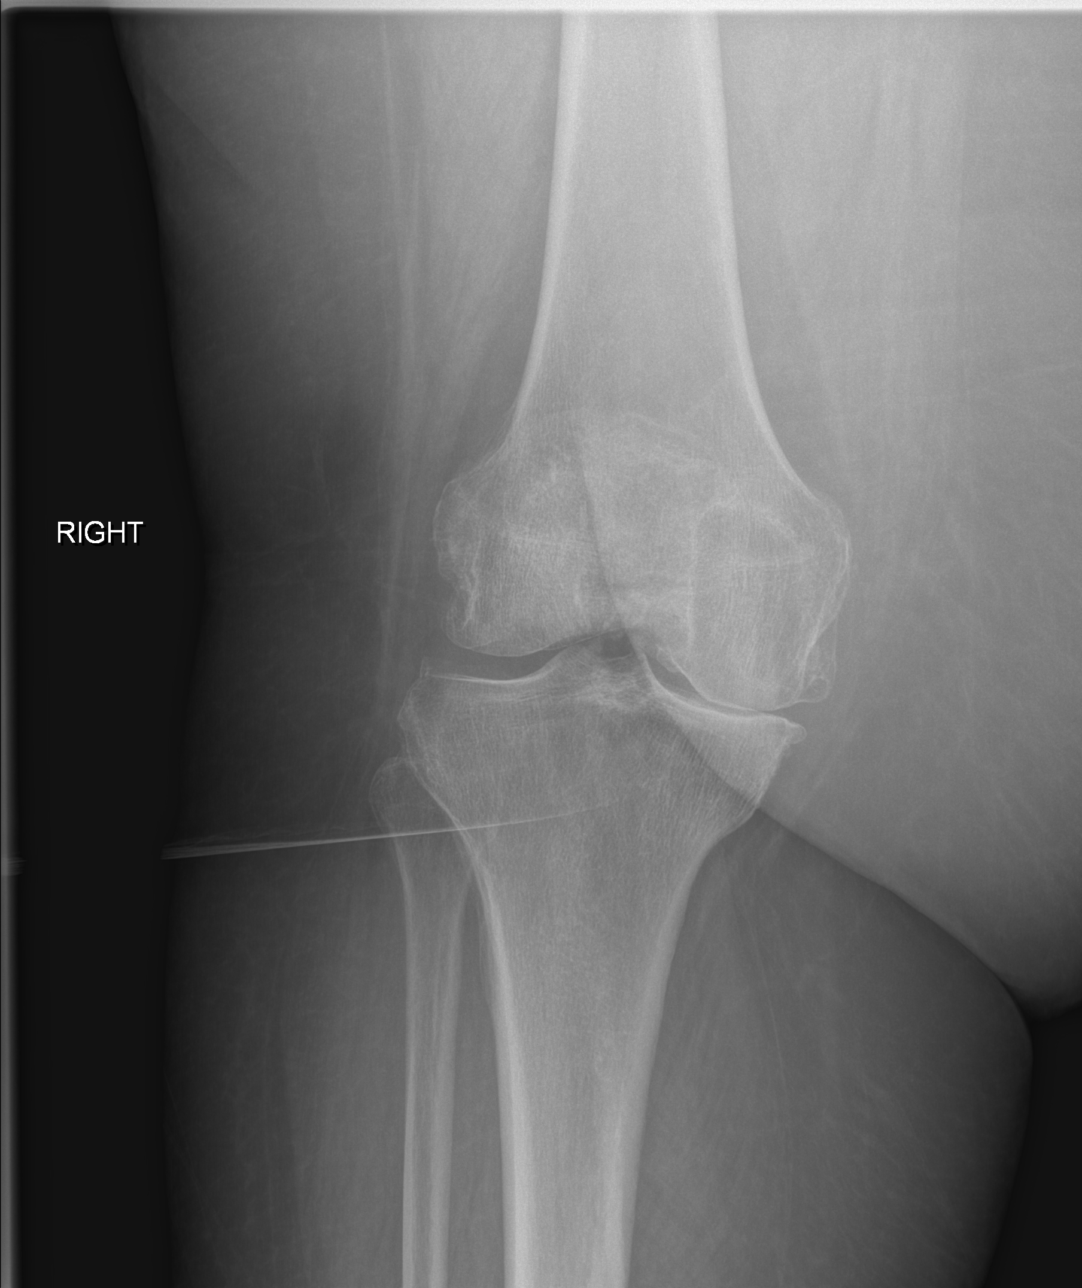

[t knee obl right (2 of 2)]
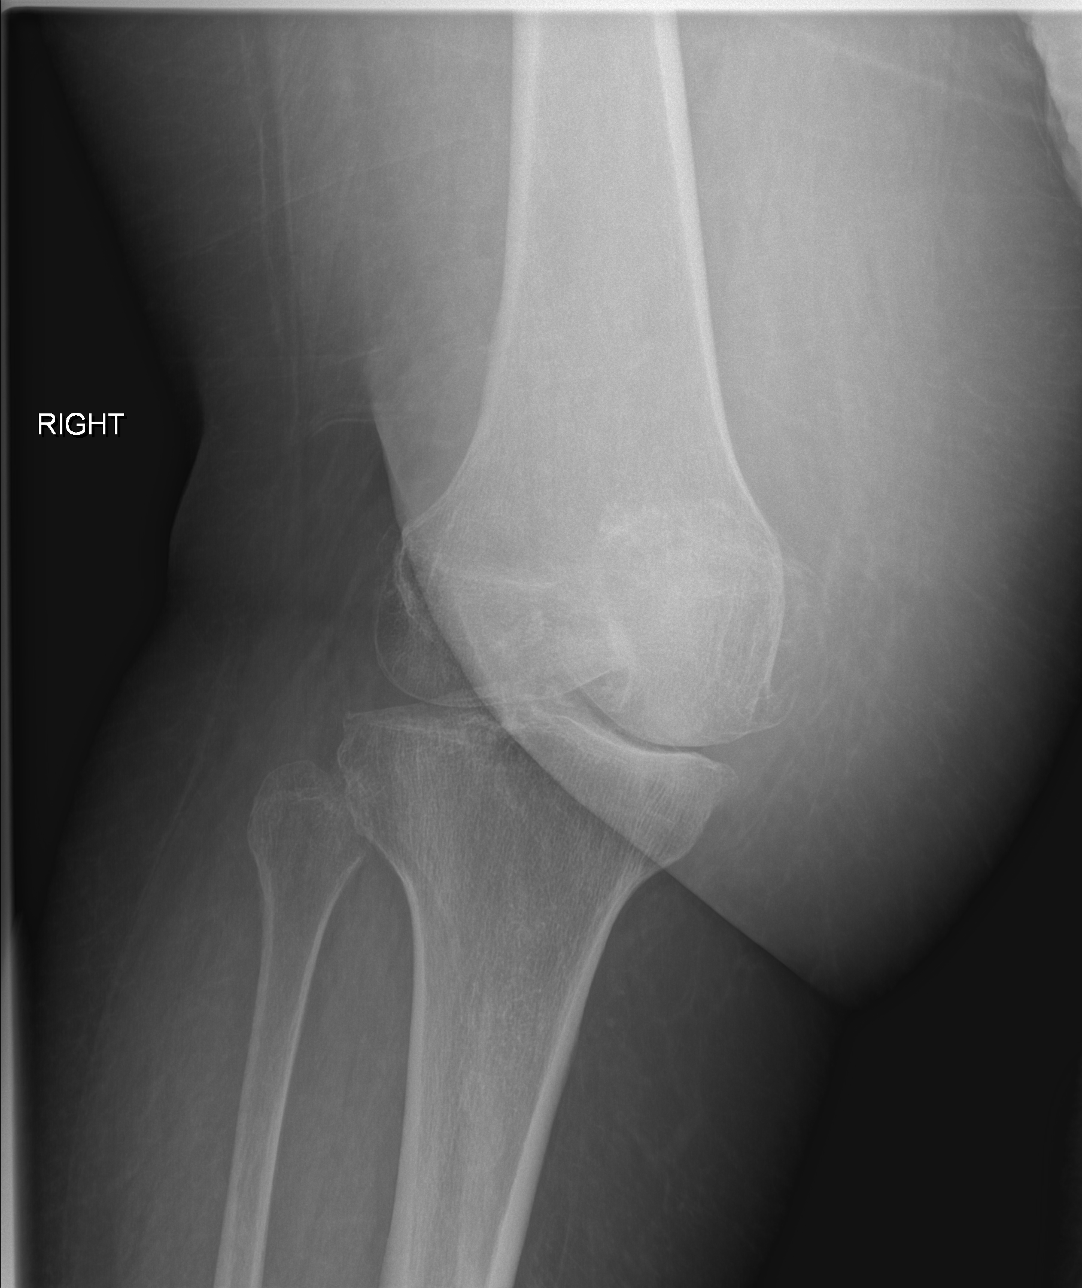

[x knee lat right]
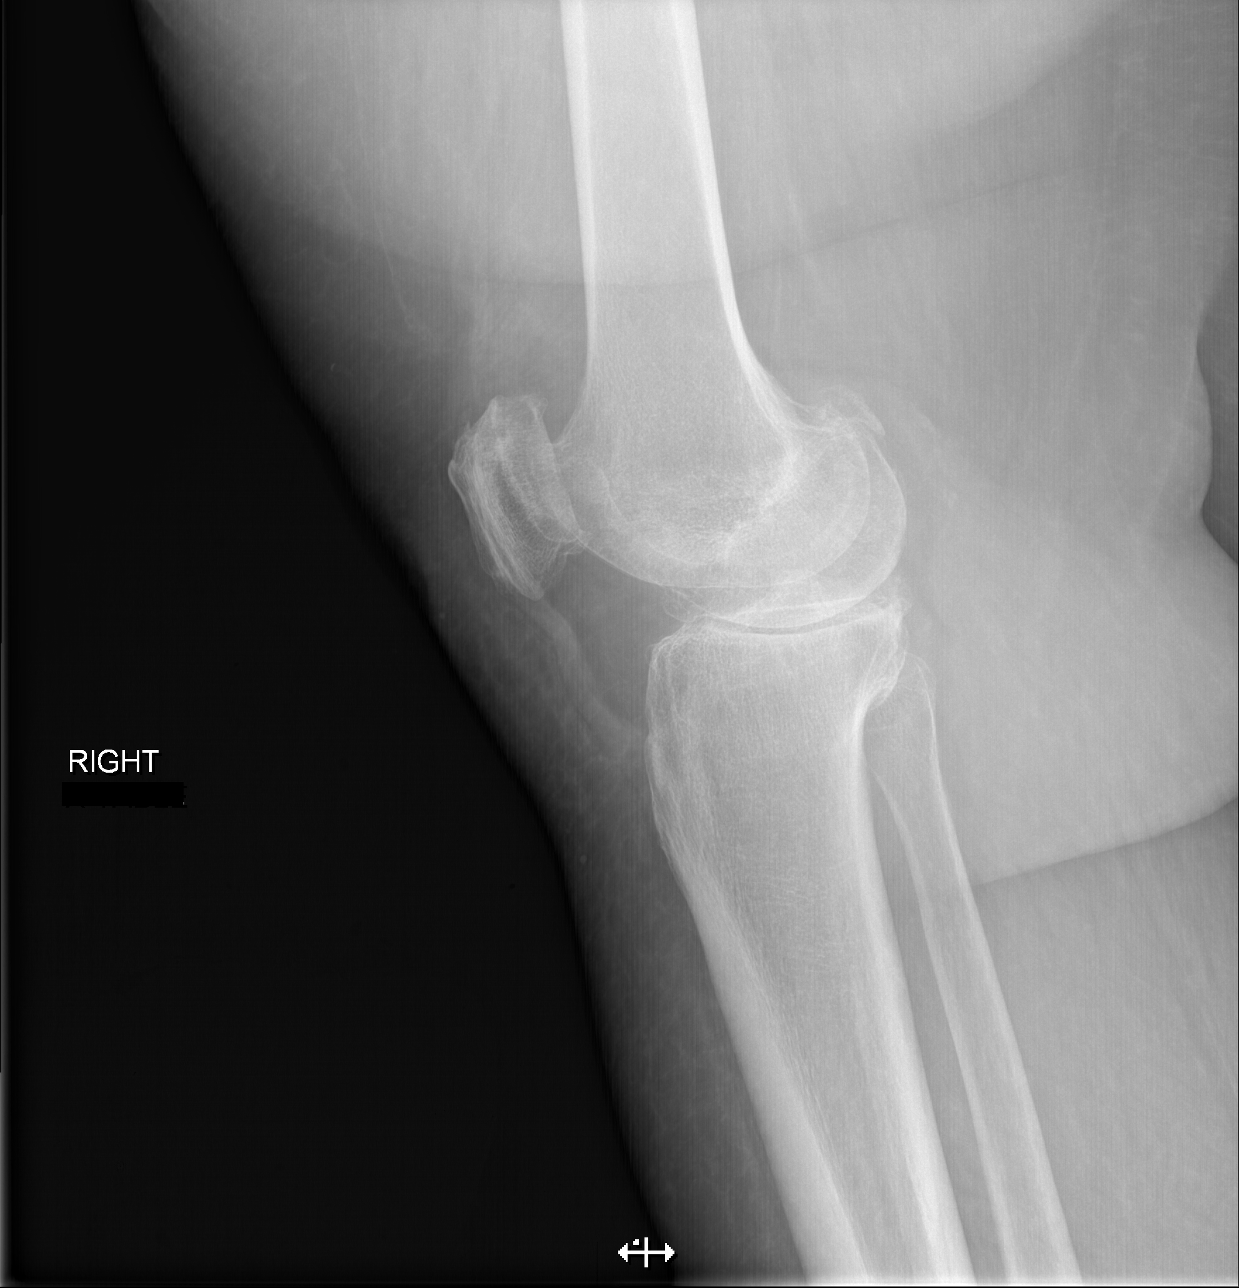

[4 of 4 positions shown; findings below may reference images not displayed]

FINDINGS: There is no acute fracture or dislocation. Mild osteopenia. Mild
arthritic changes of the hips and moderate arthritic changes of the
right knee with moderate to severe narrowing of the medial
compartment. No effusion. The soft tissues are unremarkable.
IMPRESSION: 1. No acute fracture or dislocation.
2. Osteoarthritic changes of the hips and right knee.

## 2019-06-17 IMAGING — DX DG ELBOW COMPLETE 3+V*R*
4 series · 4 of 4 positions shown · non-contrast
Comparison: Prior radiograph of the right forearm performed earlier
the same day.

CLINICAL DATA: Initial evaluation for acute fracture.

EXAM:
RIGHT ELBOW - COMPLETE 3+ VIEW

[elbow ap (1 of 2)]
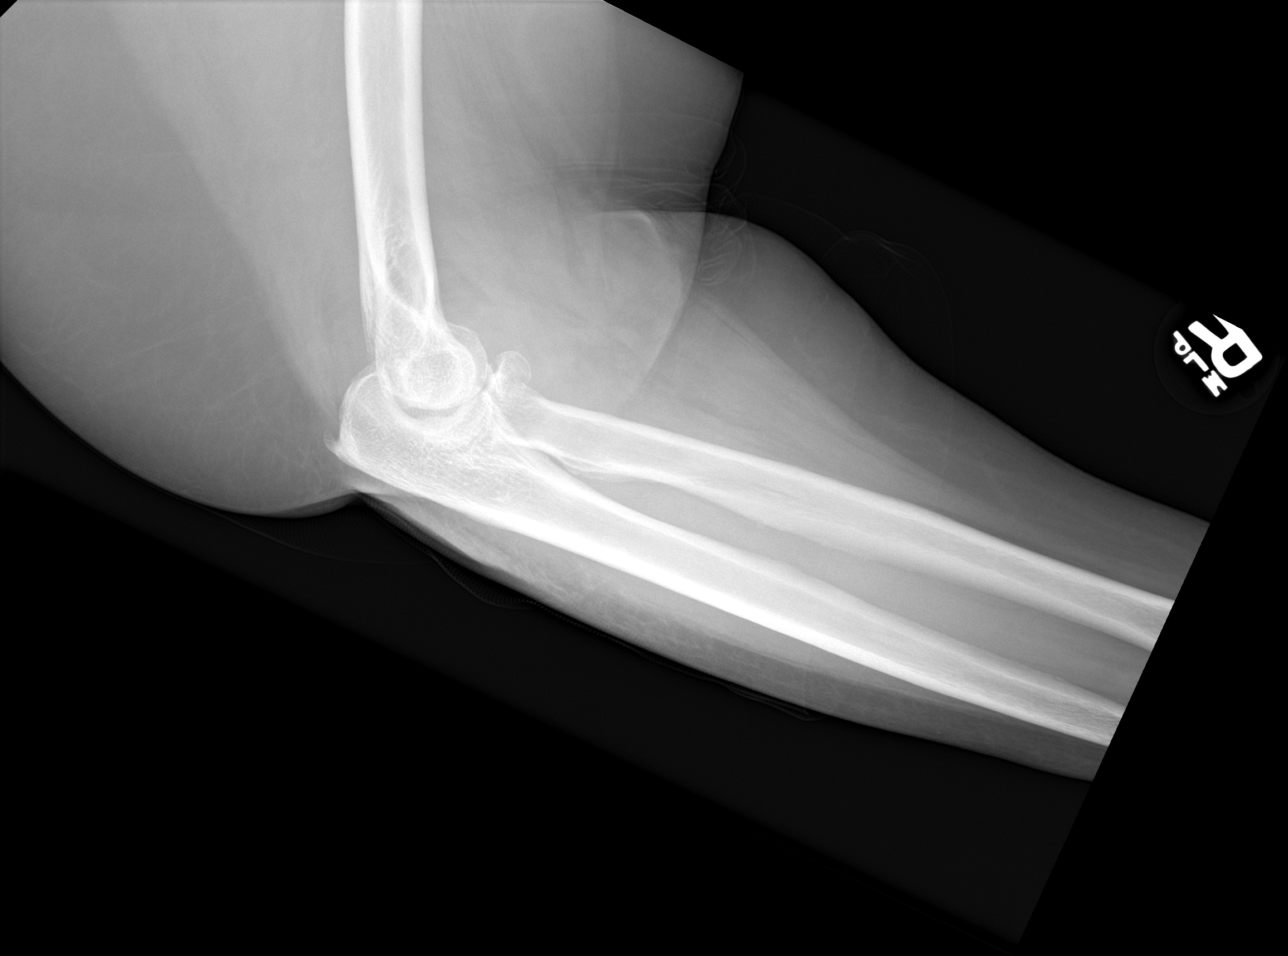

[elbow ap (2 of 2)]
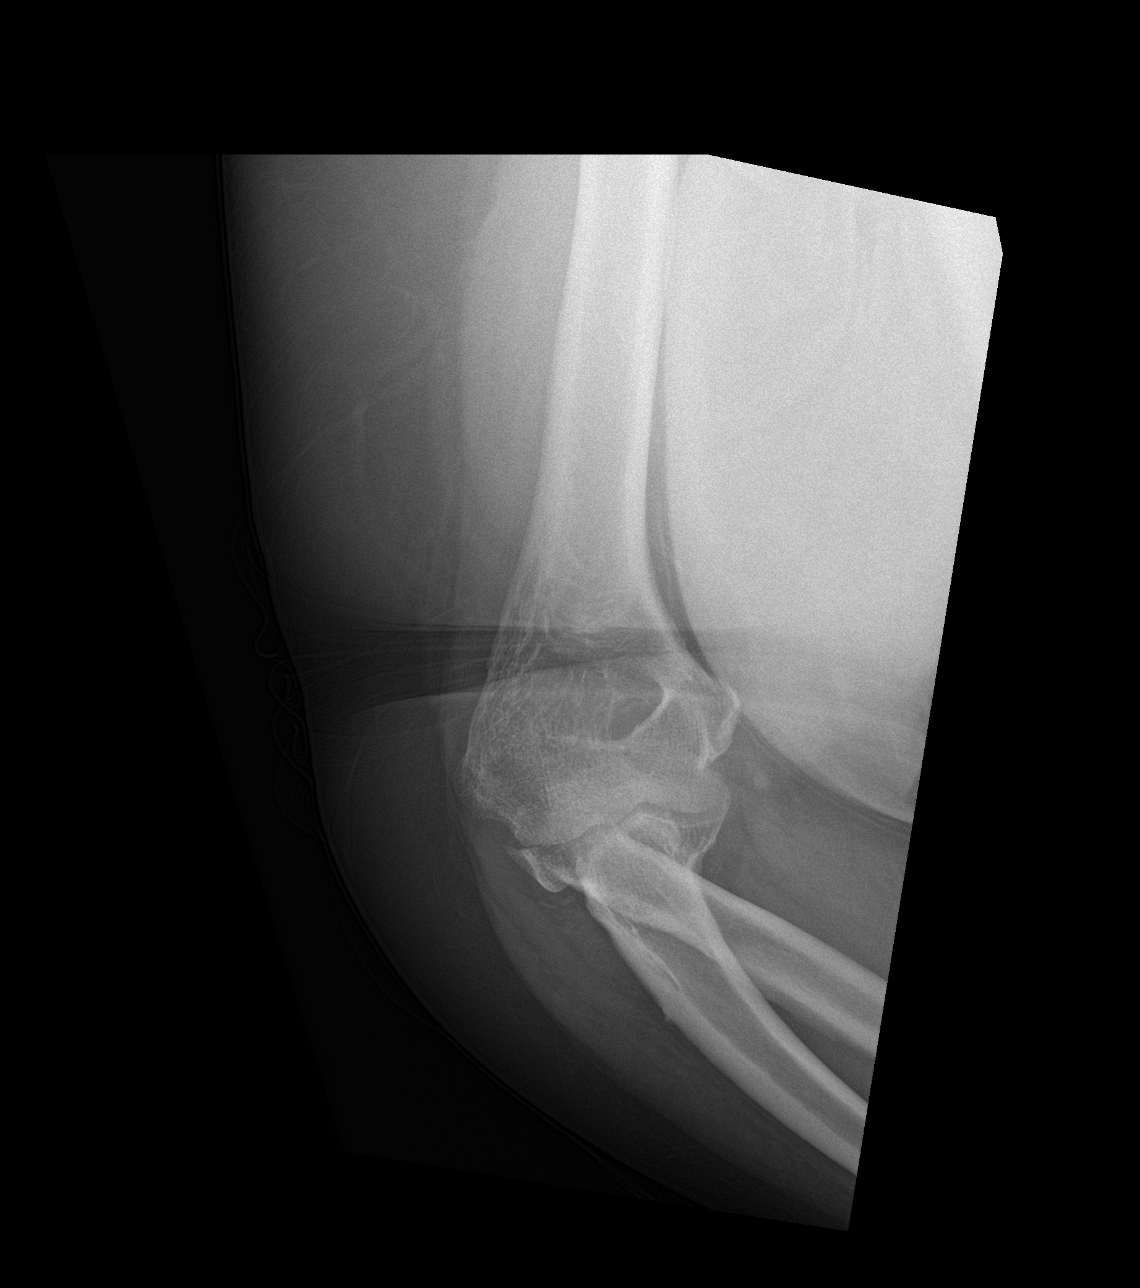

[elbow lat (1 of 2)]
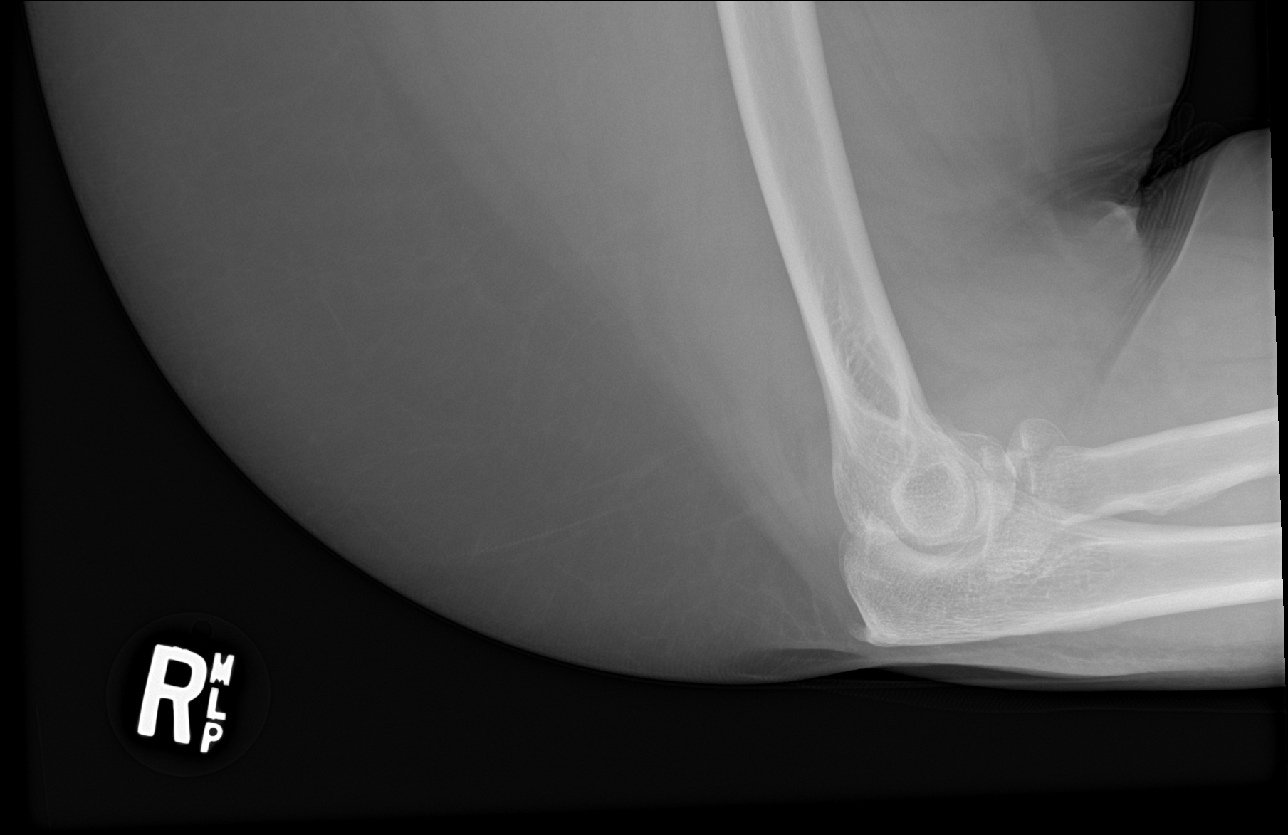

[elbow lat (2 of 2)]
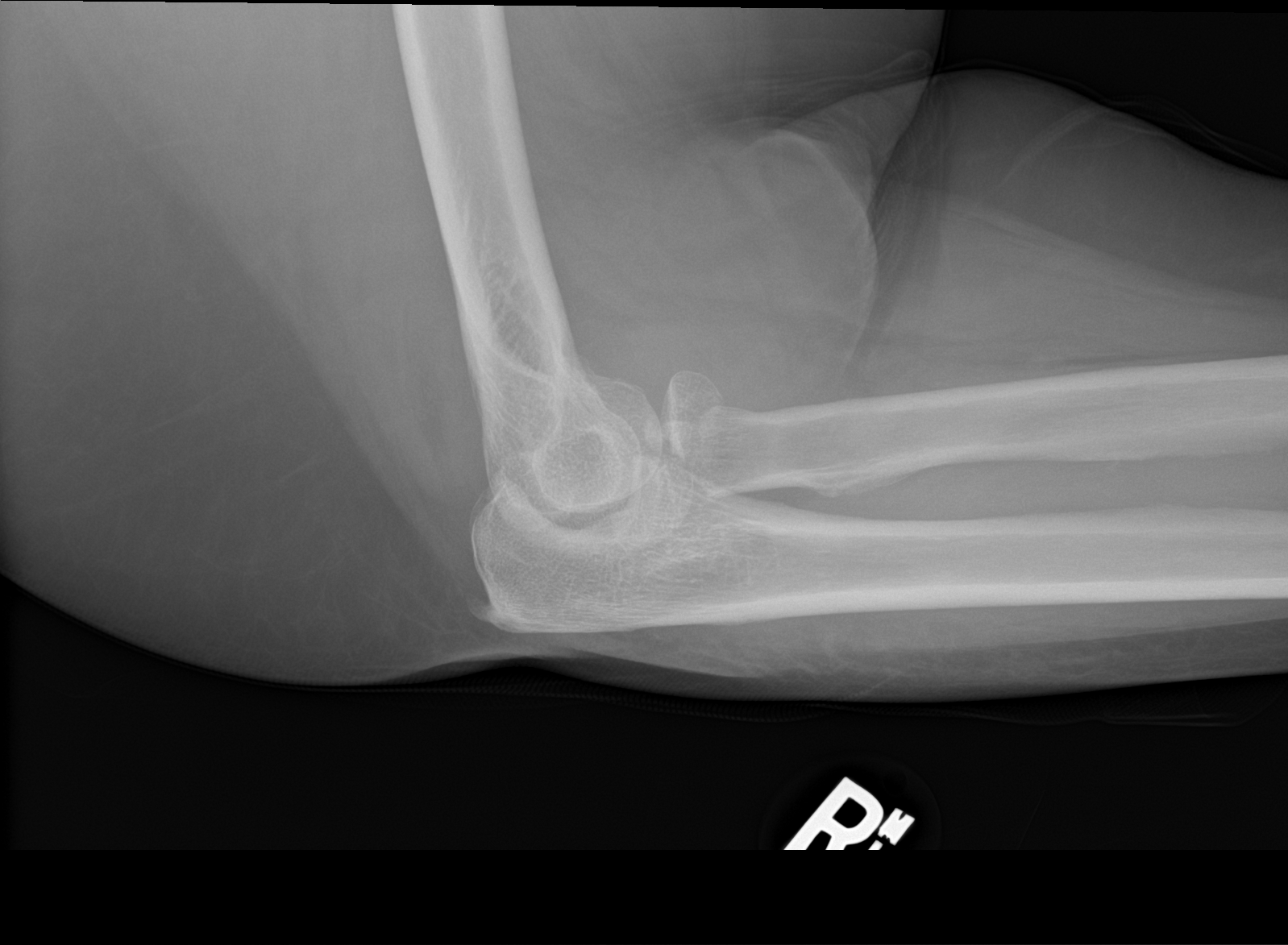

[4 of 4 positions shown; findings below may reference images not displayed]

FINDINGS: Acute comminuted fracture of the right radial head with slight
impaction. Associated moderate joint effusion. Olecranon and distal
humerus intact. Degenerative spurring noted at the posterior likely
non. No other visible soft tissue injury.
IMPRESSION: 1. Acute comminuted fracture of the right radial head with slight
impaction.
2. Moderate joint effusion.

## 2019-06-17 IMAGING — CR DG HIP (WITH OR WITHOUT PELVIS) 2-3V*R*
4 series · 4 of 4 positions shown · non-contrast
Comparison: CT of the abdomen pelvis dated [DATE]

CLINICAL DATA: 62-year-old female with fall and right lower
extremity pain.

EXAM:
RIGHT KNEE - COMPLETE 4+ VIEW; DG HIP (WITH OR WITHOUT PELVIS) 2-3V
RIGHT

[t pelvis ap (1 of 2)]
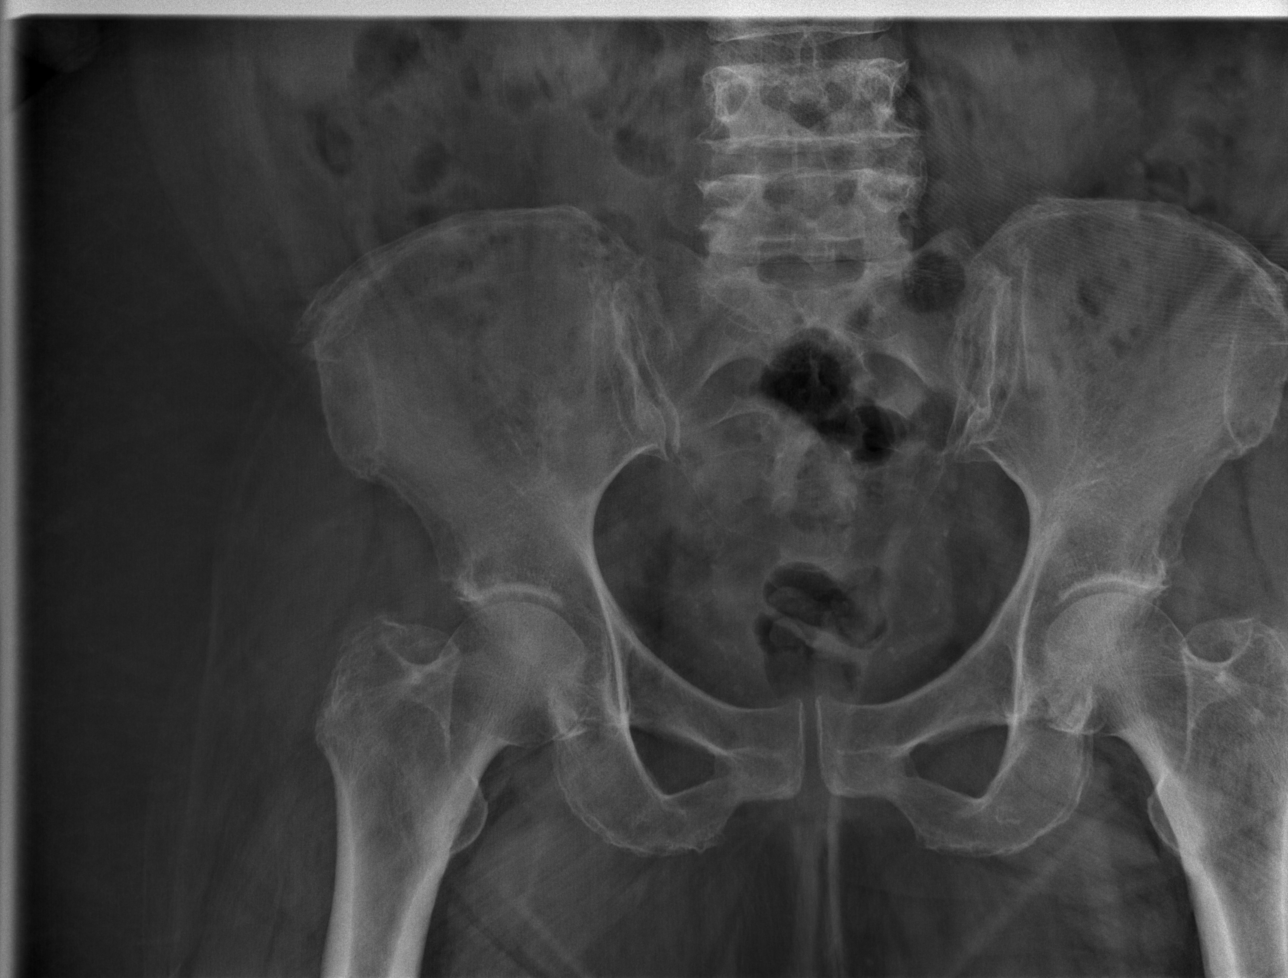

[t pelvis ap (2 of 2)]
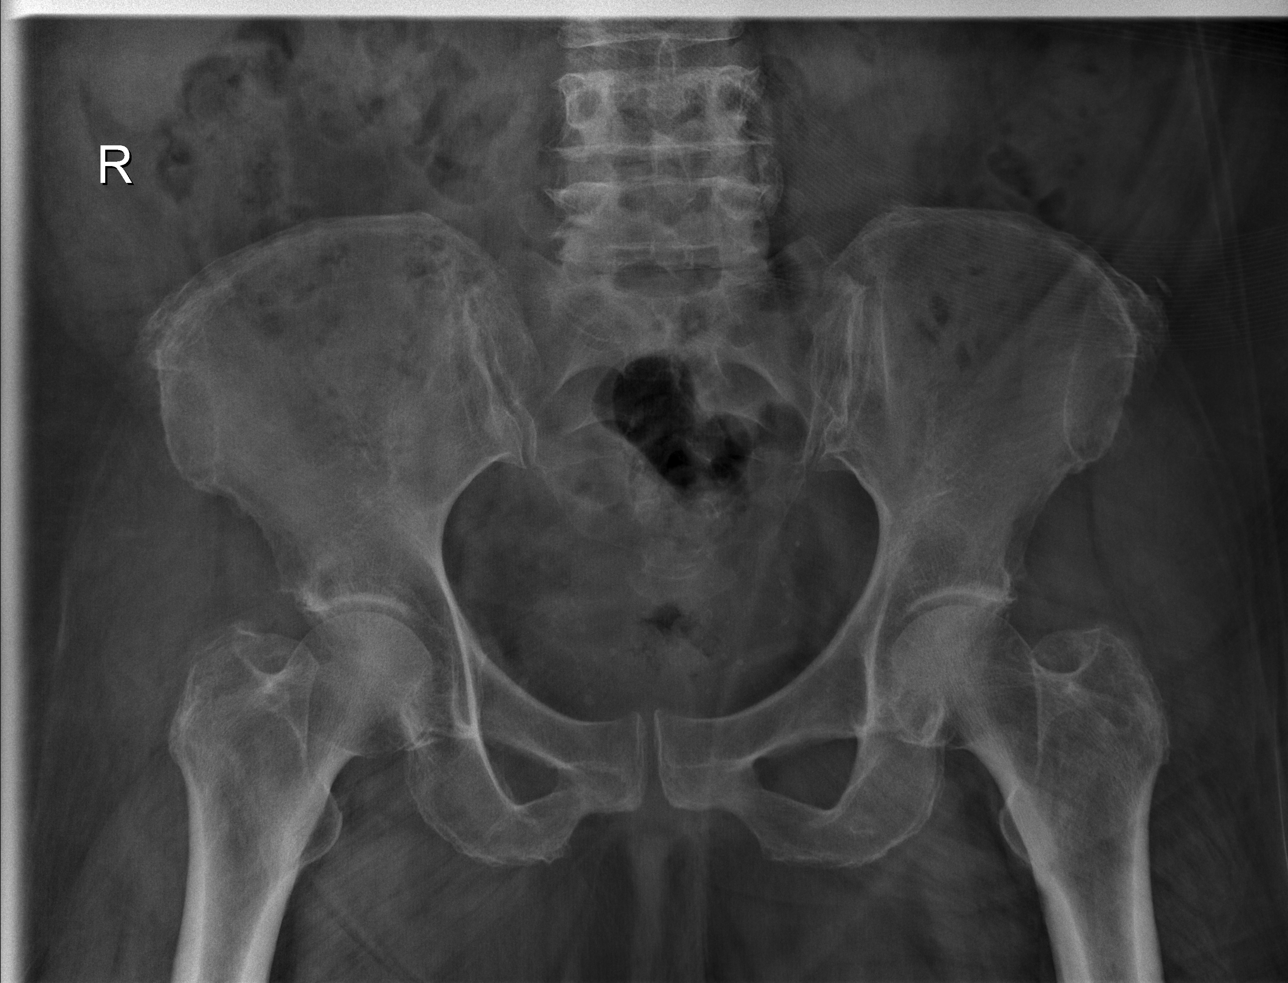

[t hip ap right]
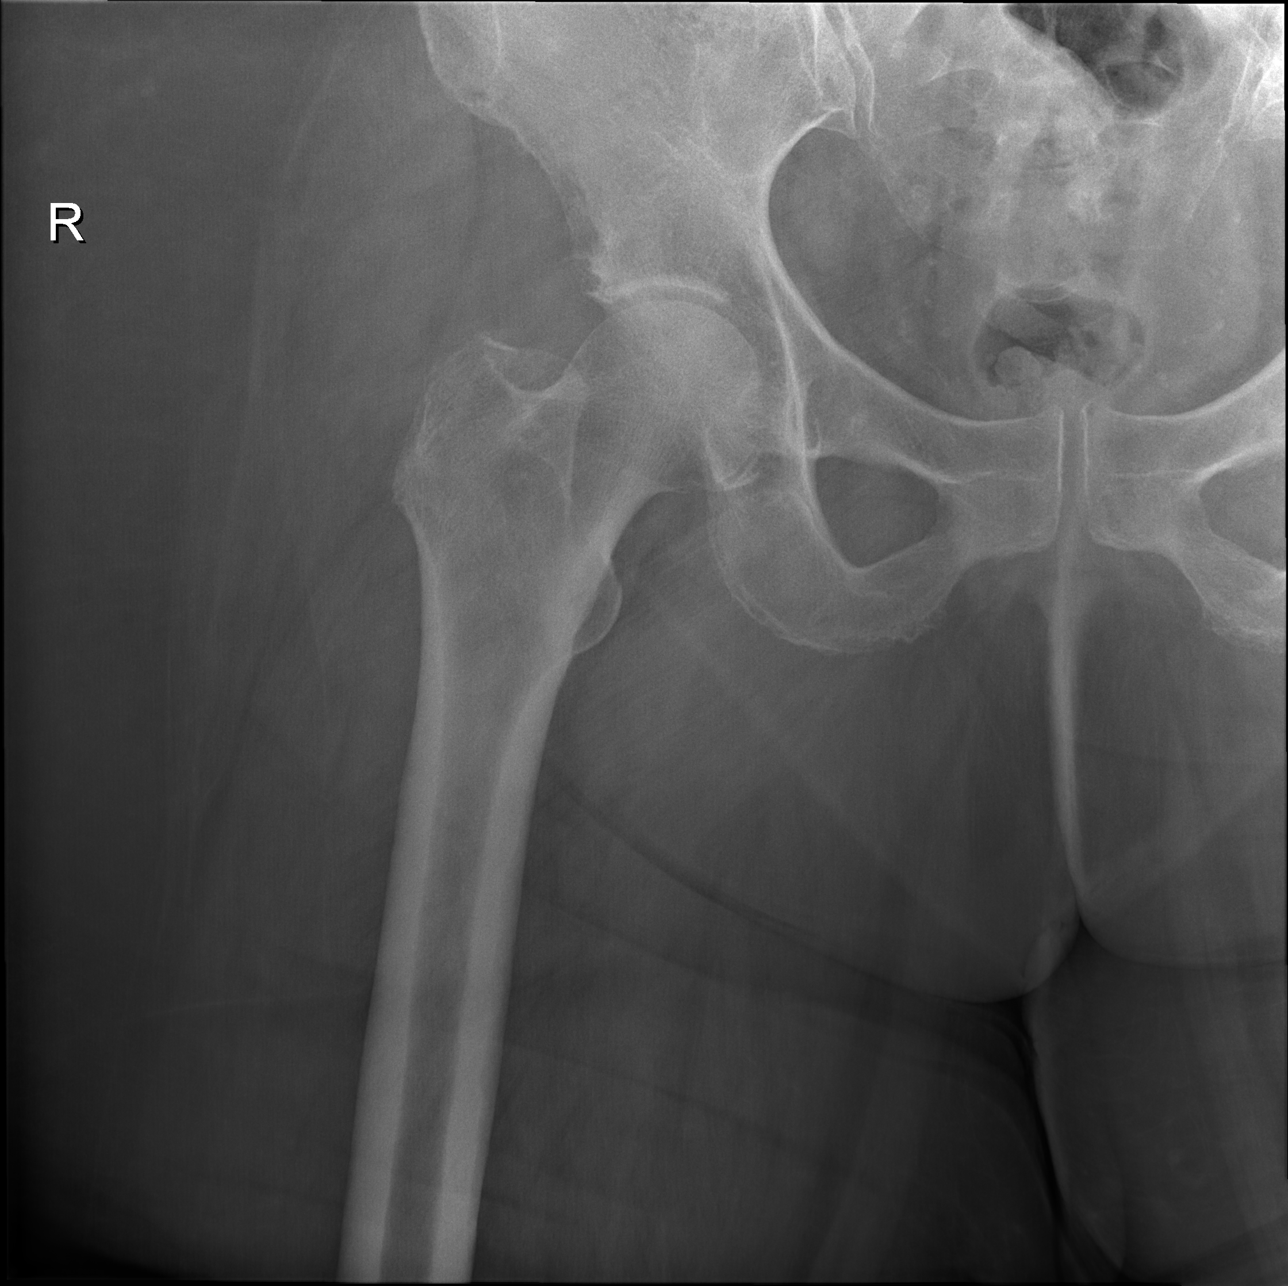

[t hip frog leg right]
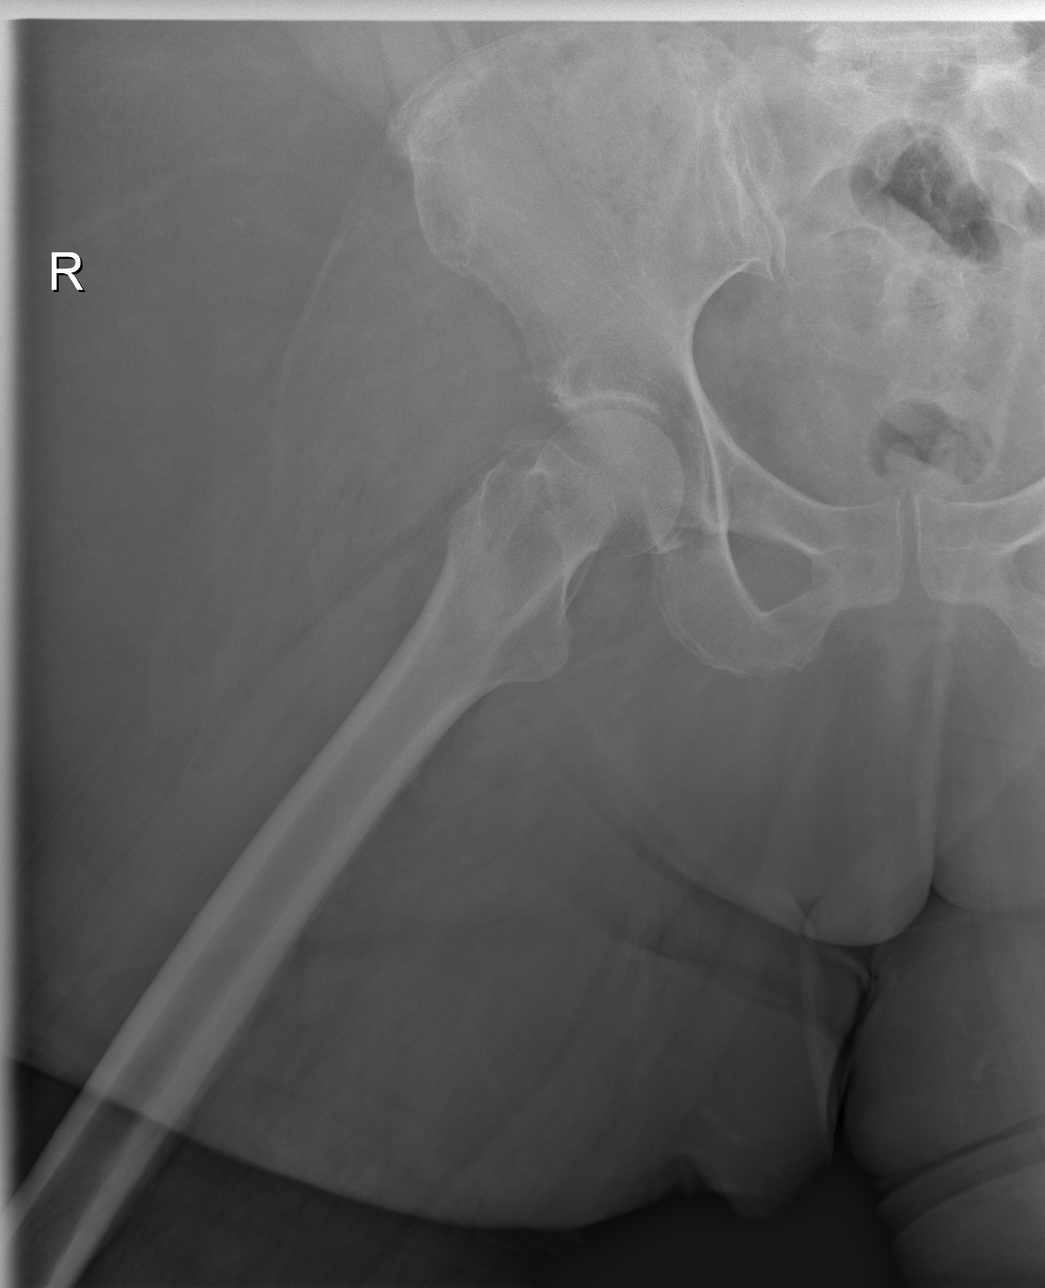

[4 of 4 positions shown; findings below may reference images not displayed]

FINDINGS: There is no acute fracture or dislocation. Mild osteopenia. Mild
arthritic changes of the hips and moderate arthritic changes of the
right knee with moderate to severe narrowing of the medial
compartment. No effusion. The soft tissues are unremarkable.
IMPRESSION: 1. No acute fracture or dislocation.
2. Osteoarthritic changes of the hips and right knee.

## 2019-06-17 MED ORDER — OXYCODONE-ACETAMINOPHEN 5-325 MG PO TABS
1.0000 | ORAL_TABLET | Freq: Once | ORAL | Status: AC
  Administered 2019-06-17: 19:00:00 1 via ORAL
  Filled 2019-06-17: qty 1

## 2019-06-17 MED ORDER — OXYCODONE-ACETAMINOPHEN 5-325 MG PO TABS: 1.0000 | ORAL_TABLET | ORAL | 0 refills | Status: DC | PRN

## 2019-06-17 NOTE — ED Provider Notes (Signed)
[facility] DEPT Provider Note   CSN: [id number] Arrival date & time: 06/17/19  1558     History   Chief Complaint Chief Complaint  Patient presents with  . Fall    HPI Azani Brogdon is a 63 y.o. female.     The history is provided by the patient and medical records. No language interpreter was used.  Fall This is a new problem. The current episode started 3 to 5 hours ago. The problem occurs rarely. The problem has not changed since onset.Pertinent negatives include no chest pain, no abdominal pain, no headaches and no shortness of breath. The symptoms are aggravated by bending. Nothing relieves the symptoms. She has tried nothing for the symptoms. The treatment provided no relief.    Past Medical History:  Diagnosis Date  . Diverticulitis   . Hypertension     Patient Active Problem List   Diagnosis Date Noted  . Frequent falls 01/10/2019  . Bilateral bunions 01/10/2019  . Toenail fungus 01/10/2019  . Alcoholic hepatitis 19/50/9326  . Edema 11/20/2018  . Alcohol-induced mood disorder ([place]) 11/01/2013  . Alcohol dependence ([place]) 11/01/2013  . Essential hypertension 06/20/2007  . LOW BACK PAIN 06/20/2007  . OBESITY 05/05/2007  . DIVERTICULOSIS, COLON 05/05/2007    Past Surgical History:  Procedure Laterality Date  . TUBAL LIGATION       OB History   No obstetric history on file.      Home Medications    Prior to Admission medications   Medication Sig Start Date End Date Taking? Authorizing Provider  benazepril (LOTENSIN) 20 MG tablet Take 1 tablet (20 mg total) by mouth daily. To lower blood pressure 06/15/19   Fulp, Cammie, MD  Blood Pressure Monitoring ([employer] ADVANCED BP MONITOR) DEVI Measure blood pressure daily and record in diary 12/18/18   Elsie Stain, MD  furosemide (LASIX) 40 MG tablet Take 1 tablet (40 mg total) by mouth daily. 11/20/18   Noralee Space, MD  meloxicam (MOBIC) 15 MG tablet Take 1 tablet (15 mg  total) by mouth daily. 05/02/19   Edrick Kins, DPM  metoprolol tartrate (LOPRESSOR) 50 MG tablet Take 1 tablet (50 mg total) by mouth 2 (two) times daily. 11/20/18   Noralee Space, MD  Misc Natural Products (LAXATIVE FORMULA) TABS Take 1 tablet by mouth once.    [provider]  Multiple Vitamins-Minerals (CENTRUM SILVER ADULT 50+) TABS Take 1 tablet by mouth daily.    [provider]  potassium chloride SA (K-DUR) 20 MEQ tablet Take 1 tablet (20 mEq total) by mouth daily. 01/10/19   Elsie Stain, MD    Family History Family History  Problem Relation Age of Onset  . Hypertension Mother     Social History Social History   Tobacco Use  . Smoking status: Never Smoker  . Smokeless tobacco: Never Used  Substance Use Topics  . Alcohol use: Yes    Alcohol/week: 10.0 standard drinks    Types: 10 Shots of liquor per week    Comment: EOD-weekends  . Drug use: No     Allergies   Patient has no known allergies.   Review of Systems Review of Systems  Constitutional: Negative for chills, diaphoresis, fatigue and fever.  Respiratory: Negative for cough, chest tightness, shortness of breath and wheezing.   Cardiovascular: Negative for chest pain and palpitations.  Gastrointestinal: Negative for abdominal pain, constipation, diarrhea, nausea and vomiting.  Genitourinary: Negative for dysuria and flank pain.  Musculoskeletal: Negative for back pain, neck pain and neck stiffness.  Skin: Positive for wound (abrasionon R leg).  Neurological: Negative for weakness, light-headedness, numbness and headaches.  Psychiatric/Behavioral: Negative for agitation and confusion.  All other systems reviewed and are negative.    Physical Exam Updated Vital Signs BP (!) 139/100 (BP Location: Left Arm)   Pulse 75   Temp 98.1 F (36.7 C) (Oral)   Resp 18   SpO2 98%   Physical Exam Vitals signs and nursing note reviewed.  Constitutional:      General: She is not in acute  distress.    Appearance: Normal appearance. She is well-developed. She is obese. She is not ill-appearing, toxic-appearing or diaphoretic.  HENT:     Head: Normocephalic and atraumatic.     Right Ear: External ear normal.     Left Ear: External ear normal.     Nose: Nose normal.     Mouth/Throat:     Pharynx: No oropharyngeal exudate.  Eyes:     Extraocular Movements: Extraocular movements intact.     Conjunctiva/sclera: Conjunctivae normal.     Pupils: Pupils are equal, round, and reactive to light.  Neck:     Musculoskeletal: Normal range of motion and neck supple.  Cardiovascular:     Rate and Rhythm: Normal rate.     Pulses: Normal pulses.     Heart sounds: No murmur.  Pulmonary:     Effort: Pulmonary effort is normal. No respiratory distress.     Breath sounds: No stridor. No wheezing, rhonchi or rales.  Chest:     Chest wall: No tenderness.  Abdominal:     General: There is no distension.     Tenderness: There is no abdominal tenderness. There is no right CVA tenderness, left CVA tenderness or rebound.  Musculoskeletal:        General: Tenderness and signs of injury present.     Right elbow: She exhibits decreased range of motion (due to pain). She exhibits no laceration. Tenderness found.     Right hip: She exhibits tenderness.     Right knee: Tenderness found.     Right lower leg: No edema.     Left lower leg: No edema.     Comments: Normal grip strength, sensation, and pulse in right arm.  Tenderness and pain at the right elbow.  Pain with range of motion.  No tenderness in the wrist or shoulder.  Lungs clear and chest nontender.  Tenderness in the right hip and right knee with an abrasion on the right shin and knee.  No laceration seen.  Good pulse, sensation, and strength in the feet.  No back tenderness on exam.  No focal neurologic deficit seen.  Skin:    General: Skin is warm.     Capillary Refill: Capillary refill takes less than 2 seconds.     Findings:  No erythema or rash.  Neurological:     General: No focal deficit present.     Mental Status: She is alert and oriented to person, place, and time.     Sensory: No sensory deficit.     Motor: No weakness or abnormal muscle tone.     Deep Tendon Reflexes: Reflexes are normal and symmetric.  Psychiatric:        Mood and Affect: Mood normal.      ED Treatments / Results  Labs (all labs ordered are listed, but only abnormal results are displayed) Labs Reviewed - No data to display  EKG None  Radiology Dg Elbow Complete Right  Result Date: 06/17/2019 CLINICAL DATA:  Initial evaluation for acute fracture. EXAM: RIGHT ELBOW - COMPLETE 3+ VIEW COMPARISON:  Prior radiograph of the right forearm performed earlier the same day. FINDINGS: Acute comminuted fracture of the right radial head with slight impaction. Associated moderate joint effusion. Olecranon and distal humerus intact. Degenerative spurring noted at the posterior likely non. No other visible soft tissue injury. IMPRESSION: 1. Acute comminuted fracture of the right radial head with slight impaction. 2. Moderate joint effusion. Electronically Signed   By: Jeannine Boga M.D.   On: 06/17/2019 19:52   Dg Forearm Right  Result Date: 06/17/2019 CLINICAL DATA:  63 year old female with fall and trauma to the right forearm. EXAM: RIGHT FOREARM - 2 VIEW COMPARISON:  Right hand radiograph dated 01/11/1999 FINDINGS: Evaluation of the elbow is limited due to positioning. There is a fracture of the radial head or neck. No definite other acute fracture identified. There is no dislocation. The bones are mildly osteopenic. The soft tissues are unremarkable. IMPRESSION: 1. Fracture of the radial head or neck. No dislocation. 2. No definite other acute fracture. Electronically Signed   By: Anner Crete M.D.   On: 06/17/2019 17:22   Dg Knee Complete 4 Views Right  Result Date: 06/17/2019 CLINICAL DATA:  63 year old female with fall and  right lower extremity pain. EXAM: RIGHT KNEE - COMPLETE 4+ VIEW; DG HIP (WITH OR WITHOUT PELVIS) 2-3V RIGHT COMPARISON:  CT of the abdomen pelvis dated 07/14/2012 FINDINGS: There is no acute fracture or dislocation. Mild osteopenia. Mild arthritic changes of the hips and moderate arthritic changes of the right knee with moderate to severe narrowing of the medial compartment. No effusion. The soft tissues are unremarkable. IMPRESSION: 1. No acute fracture or dislocation. 2. Osteoarthritic changes of the hips and right knee. Electronically Signed   By: Anner Crete M.D.   On: 06/17/2019 17:19   Dg Hip Unilat  With Pelvis 2-3 Views Right  Result Date: 06/17/2019 CLINICAL DATA:  63 year old female with fall and right lower extremity pain. EXAM: RIGHT KNEE - COMPLETE 4+ VIEW; DG HIP (WITH OR WITHOUT PELVIS) 2-3V RIGHT COMPARISON:  CT of the abdomen pelvis dated 07/14/2012 FINDINGS: There is no acute fracture or dislocation. Mild osteopenia. Mild arthritic changes of the hips and moderate arthritic changes of the right knee with moderate to severe narrowing of the medial compartment. No effusion. The soft tissues are unremarkable. IMPRESSION: 1. No acute fracture or dislocation. 2. Osteoarthritic changes of the hips and right knee. Electronically Signed   By: Anner Crete M.D.   On: 06/17/2019 17:19    Procedures Procedures (including critical care time)  Medications Ordered in ED Medications  oxyCODONE-acetaminophen (PERCOCET/ROXICET) 5-325 MG per tablet 1 tablet (1 tablet Oral Given 06/17/19 1929)     Initial Impression / Assessment and Plan / ED Course  I have reviewed the triage vital signs and the nursing notes.  Pertinent labs & imaging results that were available during my care of the patient were reviewed by me and considered in my medical decision making (see chart for details).        Selene Peltzer is a 63 y.o. right handed female with a past medical history significant for  hypertension and prior diverticulitis who presents with fall.  Patient reports that she was walking on a sidewalk when she tripped on a root falling on her right elbow, right hip, and right knee.  She sustained a scrape  on the right shin with minimal bleeding.  No laceration seen.  She did not hit her head and is denying any chest pain, shoulder pain, abdominal pain, or back pain.  She is primarily having pain in her right hip and right knee and right elbow.  Patient has not been moving her right arm due to the pain.  She reports the pain is 10 out of 10 in severity.  On exam, patient has tenderness in her right elbow.  There is pain with manipulation or supination/pronation of the wrist.  Patient has no tenderness in the wrist.  Good grip strength, sensation, and pulse in the right arm.  No laceration seen.  Shoulder nontender.  Lungs clear and chest and back nontender.  Abdomen nontender.  Patient has tenderness in her right hip and right knee with the abrasion.  Good pulses strength and sensation in the legs.  X-rays were obtained of the knee, hip, and forearm.  Forearm x-ray showed fracture in the elbow that was difficult to see on that image.  Dedicated elbow images will be obtained to further evaluate.  Imaging of the knee and hip were reassuring.  Patient will have further x-rays and will touch base with orthopedic hand surgery to determine appropriate splinting and follow-up.  Anticipate discharge with orthopedic follow-up.  8:30 PM X-ray of the elbow shows acute comminuted fracture of the right radial head with slight impaction.  Moderate effusion present.  Spoke with Dr. Stann Mainland with orthopedic surgery who recommends a sling, pain medication, aggressive icing, and follow-up in their clinic this week.  Patient will given the contact information for Dr. Jeannie Fend and Dr. Caralyn Guile for follow-up.  Patient given prescription for pain medication.  Patient understands return precautions and follow-up  instructions.  Patient discharged in good condition with understanding of plan of care.   Final Clinical Impressions(s) / ED Diagnoses   Final diagnoses:  Fall, initial encounter  Abrasion  Closed nondisplaced fracture of head of right radius, initial encounter    ED Discharge Orders         Ordered    oxyCODONE-acetaminophen (PERCOCET/ROXICET) 5-325 MG tablet  Every 4 hours PRN     06/17/19 2033          Clinical Impression: 1. Fall, initial encounter   2. Abrasion   3. Closed nondisplaced fracture of head of right radius, initial encounter     Disposition: Discharge  Condition: Good  I have discussed the results, Dx and Tx plan with the pt(& family if present). He/she/they expressed understanding and agree(s) with the plan. Discharge instructions discussed at great length. Strict return precautions discussed and pt &/or family have verbalized understanding of the instructions. No further questions at time of discharge.    New Prescriptions   OXYCODONE-ACETAMINOPHEN (PERCOCET/ROXICET) 5-325 MG TABLET    Take 1 tablet by mouth every 4 (four) hours as needed for severe pain.    Follow Up: Verner Mould, MD [address] [place] [address] [phone]     [place], [place] [address] [phone]     [facility] DEPT [place] [id number] [place] [phone]       Bryson Palen, Gwenyth Allegra, MD 06/17/19 2034

## 2019-06-17 NOTE — ED Triage Notes (Signed)
Per EMS-fell walking to car-tripped over tree root-complaining of right arm and knee pain-abrasion to right knee, no deformities-no LOC, no blood thinners

## 2019-06-17 NOTE — Progress Notes (Signed)
Patient ID: [name], female   DOB: 1955/12/23, 63 y.o.   MRN: [id number]   63 yo female s/p recent new patient visit and patient with increased Cr and BUN on BMP and will be asked to return to have recheck in 2-3 weeks and increase water intake

## 2019-06-17 NOTE — Discharge Instructions (Signed)
Your work-up today showed evidence of a radial head fracture in your elbow.  Please use the sling to help support and use the pain medicine and ice to help with the discomfort.  Please try to arrange it when possible when the pain has improved.  I spoke with the orthopedic team at [facility] and they recommended having you see either Dr. Jeannie Fend or Dr. Caralyn Guile later this week for evaluation and further management.  If any symptoms change or worsen, please return to nearest emergency department.  Please rest and be careful not to fall taking the pain medication.

## 2019-06-19 ENCOUNTER — Encounter: Payer: Self-pay | Admitting: Family Medicine

## 2019-06-19 ENCOUNTER — Other Ambulatory Visit: Payer: Self-pay

## 2019-06-19 ENCOUNTER — Ambulatory Visit (INDEPENDENT_AMBULATORY_CARE_PROVIDER_SITE_OTHER): Payer: Medicaid Other | Admitting: Family Medicine

## 2019-06-19 DIAGNOSIS — G8929 Other chronic pain: Secondary | ICD-10-CM

## 2019-06-19 DIAGNOSIS — M5441 Lumbago with sciatica, right side: Secondary | ICD-10-CM

## 2019-06-19 DIAGNOSIS — S52121A Displaced fracture of head of right radius, initial encounter for closed fracture: Secondary | ICD-10-CM

## 2019-06-19 MED ORDER — OXYCODONE-ACETAMINOPHEN 5-325 MG PO TABS: 1.0000 | ORAL_TABLET | Freq: Four times a day (QID) | ORAL | 0 refills | Status: DC | PRN

## 2019-06-19 NOTE — Progress Notes (Signed)
Patient ID: [name], female   DOB: 20-Nov-1955, 62 y.o.   MRN: [id number]  I called the patient tonight to discuss her radial head fracture.  I reviewed the x-ray findings with the patient and my recommendation is for radial head replacement given the comminution.  This would also allow early rehab and joint mobilization.  Risks and benefits and possible complications reviewed over the phone.  Patient agreed to proceed.  We will schedule the patient for surgery this coming Monday.  Questions encouraged and answered.

## 2019-06-19 NOTE — Progress Notes (Signed)
Office Visit Note   Patient: [name]           Date of Birth: Jul 06, 1956           MRN: [id number] Visit Date: 06/19/2019 Requested by: [name], MD [place],  [place] [address] PCP: Patient, No Pcp Per  Subjective: Chief Complaint  Patient presents with  . Lower Back - Pain    Chronic low back pain, right-sided, radiating down to the foot.  . Right Elbow - Pain    DOI 06/17/2019 Tripped over a root growing up through the sidewalk at the apartment complex where she lives. Tried to catch self, but hit right elbow, knee and hip. Was seen at [facility] ED.    HPI: She is here with right elbow back pain.  2 days ago she tripped and fell injuring her elbow.  She went to the ER where x-rays revealed a comminuted radial head fracture.  She was placed in a sling and referred to another orthopedic clinic, but she was already scheduled here for chronic low back pain so she came here for the elbow as well.  She is right-hand dominant.  She is active sewing, very much needs good use of her right arm.  Denies any numbness or tingling.  Her low back has bothered her for many months.  No injury, but she noticed gradual onset of pain in the tailbone area with radiation down the right leg, worse when she stands or walks for a long time.  Pain gets better with rest but it takes a while to settle back down again.  She has not noticed any weakness in her leg or numbness, but pain down the back of the leg to the foot.  Denies bowel or bladder dysfunction, denies fevers or chills.               ROS:   All other systems were reviewed and are negative.  Objective: Vital Signs: There were no vitals taken for this visit.  Physical Exam:  General:  Alert and oriented, in no acute distress. Pulm:  Breathing unlabored. Psy:  Normal mood, congruent affect. Skin: No rash or erythema. Right elbow: Very limited active range of motion, tender to palpation over the radial head.  Unable to  supinate due to pain.  Normal sensation in her hand. Low back: She is tender in the midline over the sacrum, and in the right sciatic notch.  Straight leg raise is negative, lower extremity strength and reflexes are normal.   Imaging: X-rays right elbow viewed on computer from October 18 show a comminuted radial head fracture with displacement and impaction.   Assessment & Plan: 1.  Acute right elbow pain due to comminuted radial head fracture -I will discuss with Dr. Erlinda Hong.  May need CT scan, may need radial head replacement. -Refilled oxycodone for acute pain.  2.  Chronic low back pain with possible foraminal stenosis -Once her elbow is healing, physical therapy.  If she fails to improve, then lumbar x-rays and MRI scan.     Procedures: No procedures performed  No notes on file     PMFS History: Patient Active Problem List   Diagnosis Date Noted  . Frequent falls 01/10/2019  . Bilateral bunions 01/10/2019  . Toenail fungus 01/10/2019  . Alcoholic hepatitis 79/48/0165  . Edema 11/20/2018  . Alcohol-induced mood disorder ([place]) 11/01/2013  . Alcohol dependence ([place]) 11/01/2013  . Essential hypertension 06/20/2007  . LOW BACK PAIN 06/20/2007  .  OBESITY 05/05/2007  . DIVERTICULOSIS, COLON 05/05/2007   Past Medical History:  Diagnosis Date  . Diverticulitis   . Hypertension     Family History  Problem Relation Age of Onset  . Hypertension Mother     Past Surgical History:  Procedure Laterality Date  . TUBAL LIGATION     Social History   Occupational History  . Not on file  Tobacco Use  . Smoking status: Never Smoker  . Smokeless tobacco: Never Used  Substance and Sexual Activity  . Alcohol use: Yes    Alcohol/week: 10.0 standard drinks    Types: 10 Shots of liquor per week    Comment: EOD-weekends  . Drug use: No  . Sexual activity: Not Currently

## 2019-06-21 ENCOUNTER — Other Ambulatory Visit (HOSPITAL_BASED_OUTPATIENT_CLINIC_OR_DEPARTMENT_OTHER): Payer: Medicaid Other | Admitting: Family Medicine

## 2019-06-21 ENCOUNTER — Other Ambulatory Visit (HOSPITAL_COMMUNITY)
Admission: RE | Admit: 2019-06-21 | Discharge: 2019-06-21 | Disposition: A | Discharge status: Home / Self Care | Payer: Medicaid Other | Source: Ambulatory Visit | Attending: Orthopaedic Surgery | Admitting: Orthopaedic Surgery

## 2019-06-21 DIAGNOSIS — M25521 Pain in right elbow: Secondary | ICD-10-CM

## 2019-06-21 DIAGNOSIS — Z01812 Encounter for preprocedural laboratory examination: Secondary | ICD-10-CM | POA: Insufficient documentation

## 2019-06-21 DIAGNOSIS — Z20828 Contact with and (suspected) exposure to other viral communicable diseases: Secondary | ICD-10-CM | POA: Insufficient documentation

## 2019-06-21 NOTE — Progress Notes (Signed)
Patient ID: [name], female   DOB: 10-16-1955, 63 y.o.   MRN: [id number]    Message received that patient also needed to see Ortho about an elbow fracture and had an appointment but now wishes to add this problem to the prior Ortho referral. New referral placed.

## 2019-06-22 ENCOUNTER — Other Ambulatory Visit: Payer: Self-pay

## 2019-06-22 ENCOUNTER — Encounter (HOSPITAL_COMMUNITY): Payer: Self-pay | Admitting: *Deleted

## 2019-06-22 LAB — NOVEL CORONAVIRUS, NAA (HOSP ORDER, SEND-OUT TO REF LAB; TAT 18-24 HRS): SARS-CoV-2, NAA: NOT DETECTED

## 2019-06-22 MED ORDER — DEXTROSE 5 % IV SOLN
3.0000 g | INTRAVENOUS | Status: AC
  Administered 2019-06-25: 10:00:00 3 g via INTRAVENOUS
  Filled 2019-06-22: qty 3
  Filled 2019-06-22: qty 3000
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
## 2019-06-24 NOTE — Anesthesia Preprocedure Evaluation (Addendum)
Anesthesia Evaluation  Patient identified by MRN, date of birth, ID band Patient awake    Reviewed: Allergy & Precautions, NPO status , Patient's Chart, lab work & pertinent test results, reviewed documented beta blocker date and time   Airway Mallampati: II  TM Distance: >3 FB Neck ROM: Full    Dental  (+) Poor Dentition, Dental Advisory Given,    Pulmonary  Likely OSA, never been tested. Snores, no witnessed apneas (lives alone)   Pulmonary exam normal breath sounds clear to auscultation       Cardiovascular hypertension, Pt. on medications and Pt. on home beta blockers negative cardio ROS Normal cardiovascular exam Rhythm:Regular Rate:Normal  Echo 12/2018 for murmur heard on exam- no abnormalities  1. The left ventricle has hyperdynamic systolic function, with an ejection fraction of >65%. The cavity size was normal. Left ventricular diastolic Doppler parameters are consistent with pseudonormalization. No evidence of left ventricular regional wall  motion abnormalities.  2. The right ventricle has normal systolic function. The cavity was normal. There is no increase in right ventricular wall thickness.  3. Left atrial size was moderately dilated.  4. The aortic root and ascending aorta are normal in size and structure.  5. The interatrial septum was not well visualized.   Neuro/Psych negative neurological ROS  negative psych ROS   GI/Hepatic negative GI ROS, (+)     substance abuse  alcohol use, Hepatitis -Hx alcoholic hepatitis   Endo/Other  Morbid obesity  Renal/GU negative Renal ROS  negative genitourinary   Musculoskeletal  (+) Arthritis , Osteoarthritis,    Abdominal (+) + obese,   Peds negative pediatric ROS (+)  Hematology negative hematology ROS (+)   Anesthesia Other Findings S/p fall from standing 06/17/19- right radial head comminuted fracture  Reproductive/Obstetrics negative OB ROS                             Anesthesia Physical Anesthesia Plan  ASA: III  Anesthesia Plan: Regional and MAC   Post-op Pain Management:  Regional for Post-op pain   Induction:   PONV Risk Score and Plan: Propofol infusion and TIVA  Airway Management Planned: Natural Airway and Nasal Cannula  Additional Equipment: None  Intra-op Plan:   Post-operative Plan:   Informed Consent: I have reviewed the patients History and Physical, chart, labs and discussed the procedure including the risks, benefits and alternatives for the proposed anesthesia with the patient or authorized representative who has indicated his/her understanding and acceptance.       Plan Discussed with: CRNA  Anesthesia Plan Comments: (Regional/MAC, GA/LMA as backup given airway exam)       Anesthesia Quick Evaluation

## 2019-06-25 ENCOUNTER — Ambulatory Visit (HOSPITAL_COMMUNITY): Payer: Medicaid Other | Admitting: Anesthesiology

## 2019-06-25 ENCOUNTER — Other Ambulatory Visit: Payer: Self-pay

## 2019-06-25 ENCOUNTER — Encounter (HOSPITAL_COMMUNITY)
Admission: RE | Disposition: A | Discharge status: Home / Self Care | Payer: Self-pay | Source: Home / Self Care | Attending: Orthopaedic Surgery

## 2019-06-25 ENCOUNTER — Ambulatory Visit (HOSPITAL_COMMUNITY)
Admission: RE | Admit: 2019-06-25 | Discharge: 2019-06-25 | Disposition: A | Discharge status: Home / Self Care | Payer: Medicaid Other | Attending: Orthopaedic Surgery | Admitting: Orthopaedic Surgery

## 2019-06-25 ENCOUNTER — Encounter (HOSPITAL_COMMUNITY): Payer: Self-pay | Admitting: Certified Registered Nurse Anesthetist

## 2019-06-25 DIAGNOSIS — K5730 Diverticulosis of large intestine without perforation or abscess without bleeding: Secondary | ICD-10-CM | POA: Diagnosis not present

## 2019-06-25 DIAGNOSIS — W1830XA Fall on same level, unspecified, initial encounter: Secondary | ICD-10-CM | POA: Diagnosis not present

## 2019-06-25 DIAGNOSIS — Z79899 Other long term (current) drug therapy: Secondary | ICD-10-CM | POA: Insufficient documentation

## 2019-06-25 DIAGNOSIS — S52041A Displaced fracture of coronoid process of right ulna, initial encounter for closed fracture: Secondary | ICD-10-CM | POA: Diagnosis not present

## 2019-06-25 DIAGNOSIS — Z6841 Body Mass Index (BMI) 40.0 and over, adult: Secondary | ICD-10-CM | POA: Insufficient documentation

## 2019-06-25 DIAGNOSIS — I10 Essential (primary) hypertension: Secondary | ICD-10-CM | POA: Insufficient documentation

## 2019-06-25 DIAGNOSIS — Z791 Long term (current) use of non-steroidal anti-inflammatories (NSAID): Secondary | ICD-10-CM | POA: Diagnosis not present

## 2019-06-25 DIAGNOSIS — S52044A Nondisplaced fracture of coronoid process of right ulna, initial encounter for closed fracture: Secondary | ICD-10-CM | POA: Diagnosis not present

## 2019-06-25 DIAGNOSIS — S52121A Displaced fracture of head of right radius, initial encounter for closed fracture: Secondary | ICD-10-CM | POA: Insufficient documentation

## 2019-06-25 HISTORY — DX: Cardiac murmur, unspecified: R01.1

## 2019-06-25 HISTORY — PX: RADIAL HEAD ARTHROPLASTY: SHX6044

## 2019-06-25 HISTORY — DX: Unspecified osteoarthritis, unspecified site: M19.90

## 2019-06-25 SURGERY — ARTHROPLASTY, RADIUS, HEAD
Anesthesia: Monitor Anesthesia Care | Laterality: Right

## 2019-06-25 MED ORDER — ONDANSETRON HCL 4 MG PO TABS: 4.0000 mg | ORAL_TABLET | Freq: Three times a day (TID) | ORAL | 0 refills | Status: DC | PRN

## 2019-06-25 MED ORDER — ONDANSETRON HCL 4 MG/2ML IJ SOLN
INTRAMUSCULAR | Status: DC | PRN
  Administered 2019-06-25: 4 mg via INTRAVENOUS

## 2019-06-25 MED ORDER — FENTANYL CITRATE (PF) 100 MCG/2ML IJ SOLN
INTRAMUSCULAR | Status: DC | PRN
  Administered 2019-06-25 (×2): 25 ug via INTRAVENOUS
  Administered 2019-06-25: 50 ug via INTRAVENOUS
  Administered 2019-06-25: 25 ug via INTRAVENOUS
  Administered 2019-06-25: 50 ug via INTRAVENOUS
  Administered 2019-06-25: 25 ug via INTRAVENOUS

## 2019-06-25 MED ORDER — DEXAMETHASONE SODIUM PHOSPHATE 10 MG/ML IJ SOLN
INTRAMUSCULAR | Status: DC | PRN
  Administered 2019-06-25: 10 mg

## 2019-06-25 MED ORDER — LIDOCAINE HCL (PF) 1 % IJ SOLN
INTRAMUSCULAR | Status: DC | PRN
  Administered 2019-06-25: 5 mL

## 2019-06-25 MED ORDER — DEXAMETHASONE SODIUM PHOSPHATE 4 MG/ML IJ SOLN
INTRAMUSCULAR | Status: DC | PRN
  Administered 2019-06-25: 5 mg via INTRAVENOUS

## 2019-06-25 MED ORDER — PROPOFOL 500 MG/50ML IV EMUL
INTRAVENOUS | Status: DC | PRN
  Administered 2019-06-25: 75 ug/kg/min via INTRAVENOUS

## 2019-06-25 MED ORDER — PROPOFOL 10 MG/ML IV BOLUS
INTRAVENOUS | Status: DC | PRN
  Administered 2019-06-25: 20 mg via INTRAVENOUS

## 2019-06-25 MED ORDER — MIDAZOLAM HCL 2 MG/2ML IJ SOLN
INTRAMUSCULAR | Status: AC
  Administered 2019-06-25: 1 mg via INTRAVENOUS
  Filled 2019-06-25: qty 2

## 2019-06-25 MED ORDER — OXYCODONE HCL 5 MG PO TABS: 5.0000 mg | ORAL_TABLET | Freq: Once | ORAL | Status: DC | PRN

## 2019-06-25 MED ORDER — OXYCODONE HCL 5 MG/5ML PO SOLN: 5.0000 mg | Freq: Once | ORAL | Status: DC | PRN

## 2019-06-25 MED ORDER — DEXAMETHASONE SODIUM PHOSPHATE 10 MG/ML IJ SOLN
INTRAMUSCULAR | Status: AC
  Filled 2019-06-25: qty 1

## 2019-06-25 MED ORDER — CHLORHEXIDINE GLUCONATE 4 % EX LIQD: 60.0000 mL | Freq: Once | CUTANEOUS | Status: DC

## 2019-06-25 MED ORDER — FENTANYL CITRATE (PF) 100 MCG/2ML IJ SOLN: 25.0000 ug | INTRAMUSCULAR | Status: DC | PRN

## 2019-06-25 MED ORDER — PHENYLEPHRINE 40 MCG/ML (10ML) SYRINGE FOR IV PUSH (FOR BLOOD PRESSURE SUPPORT)
PREFILLED_SYRINGE | INTRAVENOUS | Status: AC
  Filled 2019-06-25: qty 10

## 2019-06-25 MED ORDER — ACETAMINOPHEN 500 MG PO TABS
ORAL_TABLET | ORAL | Status: AC
  Administered 2019-06-25: 1000 mg via ORAL
  Filled 2019-06-25: qty 2

## 2019-06-25 MED ORDER — LIDOCAINE 2% (20 MG/ML) 5 ML SYRINGE
INTRAMUSCULAR | Status: AC
  Filled 2019-06-25: qty 5

## 2019-06-25 MED ORDER — PROMETHAZINE HCL 25 MG/ML IJ SOLN: 6.2500 mg | INTRAMUSCULAR | Status: DC | PRN

## 2019-06-25 MED ORDER — FENTANYL CITRATE (PF) 100 MCG/2ML IJ SOLN
50.0000 ug | Freq: Once | INTRAMUSCULAR | Status: AC
  Administered 2019-06-25: 10:00:00 50 ug via INTRAVENOUS

## 2019-06-25 MED ORDER — ONDANSETRON HCL 4 MG/2ML IJ SOLN
INTRAMUSCULAR | Status: AC
  Filled 2019-06-25: qty 2

## 2019-06-25 MED ORDER — PHENYLEPHRINE HCL (PRESSORS) 10 MG/ML IV SOLN
INTRAVENOUS | Status: DC | PRN
  Administered 2019-06-25: 80 ug via INTRAVENOUS

## 2019-06-25 MED ORDER — MEPERIDINE HCL 25 MG/ML IJ SOLN: 6.2500 mg | INTRAMUSCULAR | Status: DC | PRN

## 2019-06-25 MED ORDER — ROPIVACAINE HCL 5 MG/ML IJ SOLN
INTRAMUSCULAR | Status: DC | PRN
  Administered 2019-06-25: 20 mL via PERINEURAL

## 2019-06-25 MED ORDER — FENTANYL CITRATE (PF) 100 MCG/2ML IJ SOLN
INTRAMUSCULAR | Status: AC
  Administered 2019-06-25: 50 ug via INTRAVENOUS
  Filled 2019-06-25: qty 2

## 2019-06-25 MED ORDER — KETOROLAC TROMETHAMINE 30 MG/ML IJ SOLN: 30.0000 mg | Freq: Once | INTRAMUSCULAR | Status: DC | PRN

## 2019-06-25 MED ORDER — LACTATED RINGERS IV SOLN
INTRAVENOUS | Status: DC
  Administered 2019-06-25: 09:00:00 via INTRAVENOUS

## 2019-06-25 MED ORDER — MIDAZOLAM HCL 2 MG/2ML IJ SOLN
1.0000 mg | Freq: Once | INTRAMUSCULAR | Status: AC
  Administered 2019-06-25: 10:00:00 1 mg via INTRAVENOUS

## 2019-06-25 MED ORDER — LIDOCAINE HCL (CARDIAC) PF 100 MG/5ML IV SOSY
PREFILLED_SYRINGE | INTRAVENOUS | Status: DC | PRN
  Administered 2019-06-25: 100 mg via INTRAVENOUS

## 2019-06-25 MED ORDER — ACETAMINOPHEN 500 MG PO TABS
1000.0000 mg | ORAL_TABLET | Freq: Once | ORAL | Status: AC
  Administered 2019-06-25: 09:00:00 1000 mg via ORAL

## 2019-06-25 MED ORDER — OXYCODONE-ACETAMINOPHEN 5-325 MG PO TABS: 1.0000 | ORAL_TABLET | Freq: Three times a day (TID) | ORAL | 0 refills | Status: DC | PRN

## 2019-06-25 MED ORDER — FENTANYL CITRATE (PF) 250 MCG/5ML IJ SOLN
INTRAMUSCULAR | Status: AC
  Filled 2019-06-25: qty 5

## 2019-06-25 MED ORDER — 0.9 % SODIUM CHLORIDE (POUR BTL) OPTIME
TOPICAL | Status: DC | PRN
  Administered 2019-06-25: 1000 mL

## 2019-06-25 SURGICAL SUPPLY — 54 items
BLADE CLIPPER SURG (BLADE) IMPLANT
BLADE OSCILLATING/SAGITTAL (BLADE) ×2
BLADE SURG 10 STRL SS (BLADE) ×2 IMPLANT
BLADE SURG 15 STRL LF DISP TIS (BLADE) ×1 IMPLANT
BLADE SURG 15 STRL SS (BLADE) ×2
BLADE SW THK.38XMED NAR THN (BLADE) IMPLANT
BNDG CMPR 9X4 STRL LF SNTH (GAUZE/BANDAGES/DRESSINGS) ×1
BNDG CMPR MED 15X6 ELC VLCR LF (GAUZE/BANDAGES/DRESSINGS) ×1
BNDG ELASTIC 3X5.8 VLCR STR LF (GAUZE/BANDAGES/DRESSINGS) ×2 IMPLANT
BNDG ELASTIC 4X5.8 VLCR STR LF (GAUZE/BANDAGES/DRESSINGS) ×2 IMPLANT
BNDG ELASTIC 6X15 VLCR STRL LF (GAUZE/BANDAGES/DRESSINGS) ×1 IMPLANT
BNDG ESMARK 4X9 LF (GAUZE/BANDAGES/DRESSINGS) ×2 IMPLANT
CORD BIPOLAR FORCEPS 12FT (ELECTRODE) ×2 IMPLANT
COVER SURGICAL LIGHT HANDLE (MISCELLANEOUS) ×2 IMPLANT
COVER WAND RF STERILE (DRAPES) ×2 IMPLANT
CUFF TOURN SGL QUICK 18X4 (TOURNIQUET CUFF) ×2 IMPLANT
CUFF TOURN SGL QUICK 24 (TOURNIQUET CUFF)
CUFF TRNQT CYL 24X4X16.5-23 (TOURNIQUET CUFF) IMPLANT
DRAPE C-ARM 42X72 X-RAY (DRAPES) IMPLANT
DRAPE U-SHAPE 47X51 STRL (DRAPES) ×2 IMPLANT
DRSG XEROFORM 1X8 (GAUZE/BANDAGES/DRESSINGS) ×1 IMPLANT
ELECT CAUTERY BLADE 6.4 (BLADE) ×2 IMPLANT
GAUZE SPONGE 4X4 12PLY STRL (GAUZE/BANDAGES/DRESSINGS) ×2 IMPLANT
GAUZE SPONGE 4X4 16PLY XRAY LF (GAUZE/BANDAGES/DRESSINGS) ×1 IMPLANT
GAUZE XEROFORM 1X8 LF (GAUZE/BANDAGES/DRESSINGS) ×2 IMPLANT
GLOVE BIOGEL PI IND STRL 7.0 (GLOVE) ×1 IMPLANT
GLOVE BIOGEL PI INDICATOR 7.0 (GLOVE) ×1
GLOVE ECLIPSE 7.0 STRL STRAW (GLOVE) ×2 IMPLANT
GLOVE SKINSENSE NS SZ7.5 (GLOVE) ×1
GLOVE SKINSENSE STRL SZ7.5 (GLOVE) ×1 IMPLANT
GOWN STRL REIN XL XLG (GOWN DISPOSABLE) ×2 IMPLANT
IMPL HEAD (Orthopedic Implant) IMPLANT
IMPL STEM WITH SCREW 8X29 (Stem) IMPLANT
IMPLANT HEAD (Orthopedic Implant) ×2 IMPLANT
IMPLANT STEM WITH SCREW 8X29 (Stem) ×2 IMPLANT
KIT BASIN OR (CUSTOM PROCEDURE TRAY) ×2 IMPLANT
KIT TURNOVER KIT B (KITS) ×2 IMPLANT
NEEDLE 22X1 1/2 (OR ONLY) (NEEDLE) IMPLANT
NS IRRIG 1000ML POUR BTL (IV SOLUTION) ×5 IMPLANT
PACK ORTHO EXTREMITY (CUSTOM PROCEDURE TRAY) ×2 IMPLANT
PAD ARMBOARD 7.5X6 YLW CONV (MISCELLANEOUS) ×4 IMPLANT
PAD CAST 4YDX4 CTTN HI CHSV (CAST SUPPLIES) ×1 IMPLANT
PADDING CAST COTTON 4X4 STRL (CAST SUPPLIES) ×2
SLING ARM FOAM STRAP XLG (SOFTGOODS) ×1 IMPLANT
SPONGE LAP 4X18 RFD (DISPOSABLE) IMPLANT
SUT ETHILON 4 0 PS 2 18 (SUTURE) ×2 IMPLANT
SUT MNCRL AB 4-0 PS2 18 (SUTURE) IMPLANT
SUT PROLENE 3 0 PS 2 (SUTURE) IMPLANT
SUT VIC AB 3-0 FS2 27 (SUTURE) IMPLANT
SYR CONTROL 10ML LL (SYRINGE) IMPLANT
TOWEL GREEN STERILE (TOWEL DISPOSABLE) ×2 IMPLANT
TOWEL GREEN STERILE FF (TOWEL DISPOSABLE) ×2 IMPLANT
TUBE CONNECTING 12X1/4 (SUCTIONS) ×2 IMPLANT
UNDERPAD 30X30 (UNDERPADS AND DIAPERS) ×2 IMPLANT

## 2019-06-25 NOTE — Op Note (Signed)
   Date of Surgery: 06/25/2019  INDICATIONS: Ms. Spainhower is a 63 y.o.-year-old female with a right radial head and coronoid fracture;  The patient did consent to the procedure after discussion of the risks and benefits.  PREOPERATIVE DIAGNOSIS:  1.  Comminuted right radial head fracture  2.  Nondisplaced right coronoid fracture  POSTOPERATIVE DIAGNOSIS: Same.  PROCEDURE:  1.  Open treatment of right radial head fracture with radial head replacement 2.  Closed treatment of right nondisplaced coronoid fracture  SURGEON: N. Eduard Roux, M.D.  ASSIST: Ciro Backer [place], [place]; necessary for the timely completion of procedure and due to complexity of procedure.  ANESTHESIA: Regional block and MAC  IV FLUIDS AND URINE: See anesthesia.  ESTIMATED BLOOD LOSS: Minimal mL.  IMPLANTS: Biomet radial head 10 x 22 mm with 8 x 28 mm press-fit stem  DRAINS: None  COMPLICATIONS: see description of procedure.  DESCRIPTION OF PROCEDURE: The patient was brought to the operating room and placed supine on the operating table.  The patient had been signed prior to the procedure and this was documented. The patient had the anesthesia placed by the anesthesiologist.  A time-out was performed to confirm that this was the correct patient, site, side and location. The patient did receive antibiotics prior to the incision and was re-dosed during the procedure as needed at indicated intervals.  A tourniquet was placed.  The patient had the operative extremity prepped and draped in the standard surgical fashion.    An incision was made directly on the posterior aspect of the elbow.  Full-thickness flap was elevated radially.  A Kaplan's approach to the lateral aspect of the elbow was utilized.  The annular ligament was incised.  A small fracture hematoma was expressed from the elbow joint.  The forearm was placed in maximum pronation to protect the PIN nerve.  The comminuted radial head fracture was then  encountered.  Altogether there were 4 fracture fragments.  This was removed without difficulty.  An oscillating saw was used to clean the fractured edge of the proximal radial shaft.  She also demonstrated a nondisplaced coronoid fracture which did not impart any instability to the elbow joint therefore this fracture was not treated with any separate fixation.  We then sequentially hand broached the canal up to 8 mm with good fit.  A trial radial head 10 x 22 mm was found to be appropriate clinically and radiographically.  The trial components were then removed.  Thorough irrigation was then performed.  The final radial head was then carefully tamped into place.  Range of motion of the elbow showed concentric radiocapitellar articulation throughout the entire arc of motion.  The lateral collateral ligament complex was intact.  Layered closure was then performed.  Sterile dressings were applied.  A long-arm splint was placed.  Patient tolerated the procedure well had no immediate complications.  POSTOPERATIVE PLAN: Discharge home and follow-up in the office in 1 week for initiation of range of motion.  Nonweightbearing.  Azucena Cecil, MD 11:43 AM

## 2019-06-25 NOTE — Anesthesia Postprocedure Evaluation (Signed)
Anesthesia Post Note  Patient: [name]  Procedure(s) Performed: RIGHT RADIAL HEAD ARTHROPLASTY (Right )     Patient location during evaluation: PACU Anesthesia Type: Regional and MAC Level of consciousness: awake and alert Pain management: pain level controlled Vital Signs Assessment: post-procedure vital signs reviewed and stable Respiratory status: spontaneous breathing, nonlabored ventilation, respiratory function stable and patient connected to nasal cannula oxygen Cardiovascular status: stable and blood pressure returned to baseline Postop Assessment: no apparent nausea or vomiting Anesthetic complications: no    Last Vitals:  Vitals:   06/25/19 1225 06/25/19 1240  BP: 132/90 124/88  Pulse: 80 69  Resp: 19 17  Temp: (!) 36.4 C   SpO2: 100% 100%    Last Pain:  Vitals:   06/25/19 1240  TempSrc:   PainSc: 0-No pain                 Pervis Hocking

## 2019-06-25 NOTE — Transfer of Care (Signed)
Immediate Anesthesia Transfer of Care Note  Patient: [name]  Procedure(s) Performed: RIGHT RADIAL HEAD ARTHROPLASTY (Right )  Patient Location: PACU  Anesthesia Type:MAC combined with regional for post-op pain  Level of Consciousness: awake, alert  and oriented  Airway & Oxygen Therapy: Patient Spontanous Breathing and Patient connected to face mask oxygen  Post-op Assessment: Report given to RN, Post -op Vital signs reviewed and stable and Patient moving all extremities  Post vital signs: Reviewed and stable  Last Vitals:  Vitals Value Taken Time  BP 132/90 06/25/19 1226  Temp    Pulse 79 06/25/19 1230  Resp 18 06/25/19 1230  SpO2 100 % 06/25/19 1230  Vitals shown include unvalidated device data.  Last Pain:  Vitals:   06/25/19 1225  TempSrc:   PainSc: 0-No pain         Complications: No apparent anesthesia complications

## 2019-06-25 NOTE — Discharge Instructions (Signed)
General Anesthesia, Adult, Care After °This sheet gives you information about how to care for yourself after your procedure. Your health care provider may also give you more specific instructions. If you have problems or questions, contact your health care provider. °What can I expect after the procedure? °After the procedure, the following side effects are common: °· Pain or discomfort at the IV site. °· Nausea. °· Vomiting. °· Sore throat. °· Trouble concentrating. °· Feeling cold or chills. °· Weak or tired. °· Sleepiness and fatigue. °· Soreness and body aches. These side effects can affect parts of the body that were not involved in surgery. °Follow these instructions at home: ° °For at least 24 hours after the procedure: °· Have a responsible adult stay with you. It is important to have someone help care for you until you are awake and alert. °· Rest as needed. °· Do not: °? Participate in activities in which you could fall or become injured. °? Drive. °? Use heavy machinery. °? Drink alcohol. °? Take sleeping pills or medicines that cause drowsiness. °? Make important decisions or sign legal documents. °? Take care of children on your own. °Eating and drinking °· Follow any instructions from your health care provider about eating or drinking restrictions. °· When you feel hungry, start by eating small amounts of foods that are soft and easy to digest (bland), such as toast. Gradually return to your regular diet. °· Drink enough fluid to keep your urine pale yellow. °· If you vomit, rehydrate by drinking water, juice, or clear broth. °General instructions °· If you have sleep apnea, surgery and certain medicines can increase your risk for breathing problems. Follow instructions from your health care provider about wearing your sleep device: °? Anytime you are sleeping, including during daytime naps. °? While taking prescription pain medicines, sleeping medicines, or medicines that make you drowsy. °· Return to  your normal activities as told by your health care provider. Ask your health care provider what activities are safe for you. °· Take over-the-counter and prescription medicines only as told by your health care provider. °· If you smoke, do not smoke without supervision. °· Keep all follow-up visits as told by your health care provider. This is important. °Contact a health care provider if: °· You have nausea or vomiting that does not get better with medicine. °· You cannot eat or drink without vomiting. °· You have pain that does not get better with medicine. °· You are unable to pass urine. °· You develop a skin rash. °· You have a fever. °· You have redness around your IV site that gets worse. °Get help right away if: °· You have difficulty breathing. °· You have chest pain. °· You have blood in your urine or stool, or you vomit blood. °Summary °· After the procedure, it is common to have a sore throat or nausea. It is also common to feel tired. °· Have a responsible adult stay with you for the first 24 hours after general anesthesia. It is important to have someone help care for you until you are awake and alert. °· When you feel hungry, start by eating small amounts of foods that are soft and easy to digest (bland), such as toast. Gradually return to your regular diet. °· Drink enough fluid to keep your urine pale yellow. °· Return to your normal activities as told by your health care provider. Ask your health care provider what activities are safe for you. °This information is not   intended to replace advice given to you by your health care provider. Make sure you discuss any questions you have with your health care provider. Document Released: 11/22/2000 Document Revised: 08/19/2017 Document Reviewed: 04/01/2017 Elsevier Patient Education  [place].   Postoperative instructions:  Weightbearing instructions: non weight bearing  Keep your dressing and/or splint clean and dry at all times.  Do  not remove your surgical dressings until your follow up appointment.  Incision instructions:  Do not soak your incision for 3 weeks after surgery.  If the incision gets wet, pat dry and do not scrub the incision.  Pain control:  You have been given a prescription to be taken as directed for post-operative pain control.  In addition, elevate the operative extremity above the heart at all times to prevent swelling and throbbing pain.  Take over-the-counter Colace, 100mg  by mouth twice a day while taking narcotic pain medications to help prevent constipation.  Follow up appointments: 1) 7 days for suture removal and wound check. 2) Dr. Erlinda Hong as scheduled.   -------------------------------------------------------------------------------------------------------------  After Surgery Pain Control:  After your surgery, post-surgical discomfort or pain is likely. This discomfort can last several days to a few weeks. At certain times of the day your discomfort may be more intense.  Did you receive a nerve block?  A nerve block can provide pain relief for one hour to two days after your surgery. As long as the nerve block is working, you will experience little or no sensation in the area the surgeon operated on.  As the nerve block wears off, you will begin to experience pain or discomfort. It is very important that you begin taking your prescribed pain medication before the nerve block fully wears off. Treating your pain at the first sign of the block wearing off will ensure your pain is better controlled and more tolerable when full-sensation returns. Do not wait until the pain is intolerable, as the medicine will be less effective. It is better to treat pain in advance than to try and catch up.  General Anesthesia:  If you did not receive a nerve block during your surgery, you will need to start taking your pain medication shortly after your surgery and should continue to do so as prescribed by your  surgeon.  Pain Medication:  Most commonly we prescribe Vicodin and Percocet for post-operative pain. Both of these medications contain a combination of acetaminophen (Tylenol) and a narcotic to help control pain.   It takes between 30 and 45 minutes before pain medication starts to work. It is important to take your medication before your pain level gets too intense.   Nausea is a common side effect of many pain medications. You will want to eat something before taking your pain medicine to help prevent nausea.   If you are taking a prescription pain medication that contains acetaminophen, we recommend that you do not take additional over the counter acetaminophen (Tylenol).  Other pain relieving options:   Using a cold pack to ice the affected area a few times a day (15 to 20 minutes at a time) can help to relieve pain, reduce swelling and bruising.   Elevation of the affected area can also help to reduce pain and swelling.

## 2019-06-25 NOTE — H&P (Signed)
PREOPERATIVE H&P  Chief Complaint: right radial head fracture  HPI: [name] is a 63 y.o. female who presents for surgical treatment of right radial head fracture.  She denies any changes in medical history.  Past Medical History:  Diagnosis Date  . Arthritis   . Diverticulitis   . Heart murmur   . Hypertension    Past Surgical History:  Procedure Laterality Date  . TUBAL LIGATION     Social History   Socioeconomic History  . Marital status: Legally Separated    Spouse name: Not on file  . Number of children: Not on file  . Years of education: Not on file  . Highest education level: Not on file  Occupational History  . Not on file  Social Needs  . Financial resource strain: Not on file  . Food insecurity    Worry: Not on file    Inability: Not on file  . Transportation needs    Medical: Not on file    Non-medical: Not on file  Tobacco Use  . Smoking status: Never Smoker  . Smokeless tobacco: Never Used  Substance and Sexual Activity  . Alcohol use: Yes    Alcohol/week: 14.0 standard drinks    Types: 14 Shots of liquor per week    Comment: EOD-weekends  . Drug use: No  . Sexual activity: Not Currently  Lifestyle  . Physical activity    Days per week: Not on file    Minutes per session: Not on file  . Stress: Not on file  Relationships  . Social Herbalist on phone: Not on file    Gets together: Not on file    Attends religious service: Not on file    Active member of club or organization: Not on file    Attends meetings of clubs or organizations: Not on file    Relationship status: Not on file  Other Topics Concern  . Not on file  Social History Narrative  . Not on file   Family History  Problem Relation Age of Onset  . Hypertension Mother    No Known Allergies Prior to Admission medications   Medication Sig Start Date End Date Taking? Authorizing Provider  benazepril (LOTENSIN) 20 MG tablet Take 1 tablet (20 mg total) by  mouth daily. To lower blood pressure 06/15/19  Yes Fulp, Cammie, MD  furosemide (LASIX) 40 MG tablet Take 1 tablet (40 mg total) by mouth daily. 11/20/18  Yes Noralee Space, MD  meloxicam (MOBIC) 15 MG tablet Take 1 tablet (15 mg total) by mouth daily. 05/02/19  Yes Edrick Kins, DPM  metoprolol tartrate (LOPRESSOR) 50 MG tablet Take 1 tablet (50 mg total) by mouth 2 (two) times daily. 11/20/18  Yes Noralee Space, MD  Multiple Vitamins-Minerals (CENTRUM SILVER ADULT 50+) TABS Take 1 tablet by mouth daily.    Yes [provider]  Multiple Vitamins-Minerals (HAIR SKIN AND NAILS FORMULA PO) Take 1 tablet by mouth daily at 2 PM.   Yes [provider]  oxyCODONE-acetaminophen (PERCOCET/ROXICET) 5-325 MG tablet Take 1 tablet by mouth every 6 (six) hours as needed for severe pain. Patient taking differently: Take 1 tablet by mouth every 4 (four) hours as needed for severe pain.  06/19/19  Yes Hilts, Legrand Como, MD  potassium chloride SA (K-DUR) 20 MEQ tablet Take 1 tablet (20 mEq total) by mouth daily. 01/10/19  Yes Elsie Stain, MD  Blood Pressure Monitoring ([facility] ADVANCED BP MONITOR)  DEVI Measure blood pressure daily and record in diary 12/18/18   Elsie Stain, MD     Positive ROS: All other systems have been reviewed and were otherwise negative with the exception of those mentioned in the HPI and as above.  Physical Exam: General: Alert, no acute distress Cardiovascular: No pedal edema Respiratory: No cyanosis, no use of accessory musculature GI: abdomen soft Skin: No lesions in the area of chief complaint Neurologic: Sensation intact distally Psychiatric: Patient is competent for consent with normal mood and affect Lymphatic: no lymphedema  MUSCULOSKELETAL: exam stable  Assessment: right radial head fracture  Plan: Plan for Procedure(s): RIGHT RADIAL HEAD ARTHROPLASTY  The risks benefits and alternatives were discussed with the patient including but not limited to  the risks of nonoperative treatment, versus surgical intervention including infection, bleeding, nerve injury,  blood clots, cardiopulmonary complications, morbidity, mortality, among others, and they were willing to proceed.   Eduard Roux, MD   06/25/2019 9:02 AM

## 2019-06-25 NOTE — Anesthesia Procedure Notes (Signed)
Anesthesia Regional Block: Supraclavicular block   Pre-Anesthetic Checklist: ,, timeout performed, Correct Patient, Correct Site, Correct Laterality, Correct Procedure, Correct Position, site marked, Risks and benefits discussed,  Surgical consent,  Pre-op evaluation,  At surgeon's request and post-op pain management  Laterality: Right  Prep: Maximum Sterile Barrier Precautions used, chloraprep       Needles:  Injection technique: Single-shot  Needle Type: Echogenic Stimulator Needle     Needle Length: 9cm  Needle Gauge: 22     Additional Needles:   Procedures:,,,, ultrasound used (permanent image in chart),,,,  Narrative:  Start time: 06/25/2019 10:00 AM End time: 06/25/2019 10:05 AM Injection made incrementally with aspirations every 5 mL.  Performed by: Personally  Anesthesiologist: Pervis Hocking, DO  Additional Notes: Monitors applied. No increased pain on injection. No increased resistance to injection. Injection made in 5cc increments. Good needle visualization. Patient tolerated procedure well.

## 2019-06-27 ENCOUNTER — Encounter (HOSPITAL_COMMUNITY): Payer: Self-pay | Admitting: Orthopaedic Surgery

## 2019-06-28 ENCOUNTER — Telehealth: Payer: Self-pay | Admitting: Orthopaedic Surgery

## 2019-06-28 MED ORDER — OXYCODONE-ACETAMINOPHEN 5-325 MG PO TABS: 1.0000 | ORAL_TABLET | Freq: Three times a day (TID) | ORAL | 0 refills | Status: DC | PRN

## 2019-06-28 NOTE — Telephone Encounter (Signed)
Patient called. Says she is out of pain medication. Could you please call some in for her. Her call back number is [phone]

## 2019-06-28 NOTE — Addendum Note (Signed)
Addended by: Azucena Cecil on: 06/28/2019 03:36 PM   Modules accepted: Orders

## 2019-06-28 NOTE — Telephone Encounter (Signed)
I filled it.  If you don't mind, send all refills to [email or web address].  Thanks.

## 2019-07-03 ENCOUNTER — Ambulatory Visit (INDEPENDENT_AMBULATORY_CARE_PROVIDER_SITE_OTHER): Payer: Medicaid Other | Admitting: Orthopaedic Surgery

## 2019-07-03 ENCOUNTER — Other Ambulatory Visit: Payer: Self-pay

## 2019-07-03 ENCOUNTER — Ambulatory Visit (INDEPENDENT_AMBULATORY_CARE_PROVIDER_SITE_OTHER): Payer: Medicaid Other

## 2019-07-03 ENCOUNTER — Other Ambulatory Visit: Payer: Self-pay | Admitting: Pulmonary Disease

## 2019-07-03 ENCOUNTER — Encounter: Payer: Self-pay | Admitting: Orthopaedic Surgery

## 2019-07-03 DIAGNOSIS — S52121A Displaced fracture of head of right radius, initial encounter for closed fracture: Secondary | ICD-10-CM

## 2019-07-03 MED ORDER — HYDROCODONE-ACETAMINOPHEN 5-325 MG PO TABS: 1.0000 | ORAL_TABLET | Freq: Two times a day (BID) | ORAL | 0 refills | Status: DC | PRN

## 2019-07-03 NOTE — Progress Notes (Signed)
   Post-Op Visit Note   Patient: [name]           Date of Birth: 06/23/1956           MRN: [id number] Visit Date: 07/03/2019 PCP: Patient, No Pcp Per   Assessment & Plan:  Chief Complaint:  Chief Complaint  Patient presents with  . Right Elbow - Pain, Routine Post Op   Visit Diagnoses:  1. Closed displaced fracture of head of right radius, initial encounter     Plan: [name] is 1 week status post right radial head replacement for a comminuted radial head fracture.  She comes in today for concern of swelling of her hand.  She is taking oxycodone couple times a day.  Denies any constitutional symptoms.  Physical exam shows a benign surgical incision.  Neurovascularly intact.  PIN nerve is working.  She does have moderate swelling of her hand and wrist and elbow region.  No signs of infection.  Gentle range of motion of the elbow does not cause any pain.  We provide her with reassurance that everything looks good.  We did provide her with a ace wrap for compression from the hand up to the upper arm.  We will see her back next week for suture removal.  Follow-Up Instructions: Return in about 1 week (around 07/10/2019).   Orders:  Orders Placed This Encounter  Procedures  . XR Elbow 2 Views Right   Meds ordered this encounter  Medications  . HYDROcodone-acetaminophen (NORCO) 5-325 MG tablet    Sig: Take 1-2 tablets by mouth 2 (two) times daily as needed.    Dispense:  30 tablet    Refill:  0    Imaging: Xr Elbow 2 Views Right  Result Date: 07/03/2019 Stable right radial head replacement without complication   PMFS History: Patient Active Problem List   Diagnosis Date Noted  . Fracture of radial head, right, closed 06/25/2019  . Frequent falls 01/10/2019  . Bilateral bunions 01/10/2019  . Toenail fungus 01/10/2019  . Alcoholic hepatitis 95/18/8416  . Edema 11/20/2018  . Alcohol-induced mood disorder ([place]) 11/01/2013  . Alcohol dependence ([place]) 11/01/2013  .  Essential hypertension 06/20/2007  . LOW BACK PAIN 06/20/2007  . OBESITY 05/05/2007  . DIVERTICULOSIS, COLON 05/05/2007   Past Medical History:  Diagnosis Date  . Arthritis   . Diverticulitis   . Heart murmur   . Hypertension     Family History  Problem Relation Age of Onset  . Hypertension Mother     Past Surgical History:  Procedure Laterality Date  . RADIAL HEAD ARTHROPLASTY Right 06/25/2019   Procedure: RIGHT RADIAL HEAD ARTHROPLASTY;  Surgeon: [name], MD;  Location: [facility];  Service: Orthopedics;  Laterality: Right;  . TUBAL LIGATION     Social History   Occupational History  . Not on file  Tobacco Use  . Smoking status: Never Smoker  . Smokeless tobacco: Never Used  Substance and Sexual Activity  . Alcohol use: Yes    Alcohol/week: 14.0 standard drinks    Types: 14 Shots of liquor per week    Comment: EOD-weekends  . Drug use: No  . Sexual activity: Not Currently

## 2019-07-09 ENCOUNTER — Other Ambulatory Visit: Payer: Medicaid Other

## 2019-07-10 ENCOUNTER — Other Ambulatory Visit: Payer: Self-pay | Admitting: Podiatry

## 2019-07-10 ENCOUNTER — Encounter: Payer: Self-pay | Admitting: Orthopaedic Surgery

## 2019-07-10 ENCOUNTER — Ambulatory Visit: Payer: Self-pay

## 2019-07-10 ENCOUNTER — Ambulatory Visit (INDEPENDENT_AMBULATORY_CARE_PROVIDER_SITE_OTHER): Payer: Medicaid Other | Admitting: Orthopaedic Surgery

## 2019-07-10 ENCOUNTER — Other Ambulatory Visit: Payer: Self-pay

## 2019-07-10 DIAGNOSIS — S52121A Displaced fracture of head of right radius, initial encounter for closed fracture: Secondary | ICD-10-CM

## 2019-07-10 MED ORDER — OXYCODONE-ACETAMINOPHEN 5-325 MG PO TABS: 1.0000 | ORAL_TABLET | Freq: Two times a day (BID) | ORAL | 0 refills | Status: DC | PRN

## 2019-07-10 NOTE — Progress Notes (Signed)
   Post-Op Visit Note   Patient: [name]           Date of Birth: February 22, 1956           MRN: [id number] Visit Date: 07/10/2019 PCP: Patient, No Pcp Per   Assessment & Plan:  Chief Complaint:  Chief Complaint  Patient presents with  . Right Elbow - Pain   Visit Diagnoses:  1. Closed displaced fracture of head of right radius, initial encounter     Plan: Patient is a pleasant 63 year old female who presents to our clinic today 2 weeks status post right radial head replacement, date of surgery 06/25/2019.  She has been doing well over the past week.  Her swelling has improved.  She is taking oxycodone 2 tabs in the morning 2 at night with moderate relief of her symptoms.  No fevers or chills.  Overall, doing much better.  Examination of the right elbow reveals a well-healing surgical incision with nylon sutures in place.  No evidence of infection or cellulitis.  Minimal swelling to the right upper extremity.  She is neurovascularly intact distally.  At this point, sutures were removed and Steri-Strips applied.  She will continue to wear her sling but may come out for range of motion exercises of the wrist and elbow.  Follow-up with [place] in 2 weeks time for three-view x-rays of the right elbow and the initiation of occupational therapy.  Call with concerns or questions in the meantime.  Follow-Up Instructions: Return in about 2 weeks (around 07/24/2019).   Orders:  Orders Placed This Encounter  Procedures  . XR Elbow 2 Views Right   Meds ordered this encounter  Medications  . oxyCODONE-acetaminophen (PERCOCET) 5-325 MG tablet    Sig: Take 1-2 tablets by mouth 2 (two) times daily as needed for severe pain.    Dispense:  30 tablet    Refill:  0    Imaging: No new imaging   PMFS History: Patient Active Problem List   Diagnosis Date Noted  . Fracture of radial head, right, closed 06/25/2019  . Frequent falls 01/10/2019  . Bilateral bunions 01/10/2019  . Toenail fungus  01/10/2019  . Alcoholic hepatitis 68/34/1962  . Edema 11/20/2018  . Alcohol-induced mood disorder ([place]) 11/01/2013  . Alcohol dependence ([place]) 11/01/2013  . Essential hypertension 06/20/2007  . LOW BACK PAIN 06/20/2007  . OBESITY 05/05/2007  . DIVERTICULOSIS, COLON 05/05/2007   Past Medical History:  Diagnosis Date  . Arthritis   . Diverticulitis   . Heart murmur   . Hypertension     Family History  Problem Relation Age of Onset  . Hypertension Mother     Past Surgical History:  Procedure Laterality Date  . RADIAL HEAD ARTHROPLASTY Right 06/25/2019   Procedure: RIGHT RADIAL HEAD ARTHROPLASTY;  Surgeon: [name], MD;  Location: [facility];  Service: Orthopedics;  Laterality: Right;  . TUBAL LIGATION     Social History   Occupational History  . Not on file  Tobacco Use  . Smoking status: Never Smoker  . Smokeless tobacco: Never Used  Substance and Sexual Activity  . Alcohol use: Yes    Alcohol/week: 14.0 standard drinks    Types: 14 Shots of liquor per week    Comment: EOD-weekends  . Drug use: No  . Sexual activity: Not Currently

## 2019-07-24 ENCOUNTER — Ambulatory Visit: Payer: Medicaid Other | Admitting: Orthopaedic Surgery

## 2019-07-30 ENCOUNTER — Other Ambulatory Visit (HOSPITAL_BASED_OUTPATIENT_CLINIC_OR_DEPARTMENT_OTHER): Payer: Medicaid Other | Admitting: Family Medicine

## 2019-07-30 ENCOUNTER — Telehealth: Payer: Self-pay | Admitting: General Practice

## 2019-07-30 ENCOUNTER — Other Ambulatory Visit: Payer: Self-pay | Admitting: Podiatry

## 2019-07-30 ENCOUNTER — Other Ambulatory Visit: Payer: Self-pay | Admitting: Critical Care Medicine

## 2019-07-30 ENCOUNTER — Other Ambulatory Visit: Payer: Self-pay | Admitting: Pulmonary Disease

## 2019-07-30 DIAGNOSIS — R609 Edema, unspecified: Secondary | ICD-10-CM

## 2019-07-30 MED ORDER — FUROSEMIDE 40 MG PO TABS: 40.0000 mg | ORAL_TABLET | Freq: Every day | ORAL | 2 refills | Status: DC

## 2019-07-30 NOTE — Progress Notes (Signed)
Patient ID: [name], female   DOB: 14-Sep-1955, 63 y.o.   MRN: [id number]   Received refill request for lasix 40 mg daily

## 2019-07-30 NOTE — Telephone Encounter (Signed)
1) Medication(s) Requested (by name): furosemide (LASIX) 40 MG tablet [[id number]  2) Pharmacy of Choice:  [id number] - [place], [place]  Approved medications will be sent to pharmacy, we will reach out to you if there is an issue.  Requests made after 3pm may not be addressed until following business day!

## 2019-08-01 ENCOUNTER — Other Ambulatory Visit: Payer: Self-pay | Admitting: Pulmonary Disease

## 2019-08-01 ENCOUNTER — Ambulatory Visit (INDEPENDENT_AMBULATORY_CARE_PROVIDER_SITE_OTHER): Payer: Medicaid Other | Admitting: Podiatry

## 2019-08-01 ENCOUNTER — Other Ambulatory Visit: Payer: Self-pay

## 2019-08-01 DIAGNOSIS — M7752 Other enthesopathy of left foot: Secondary | ICD-10-CM | POA: Diagnosis not present

## 2019-08-01 DIAGNOSIS — B351 Tinea unguium: Secondary | ICD-10-CM

## 2019-08-01 DIAGNOSIS — M79676 Pain in unspecified toe(s): Secondary | ICD-10-CM

## 2019-08-01 DIAGNOSIS — M659 Synovitis and tenosynovitis, unspecified: Secondary | ICD-10-CM

## 2019-08-01 DIAGNOSIS — M7751 Other enthesopathy of right foot: Secondary | ICD-10-CM | POA: Diagnosis not present

## 2019-08-01 MED ORDER — MELOXICAM 15 MG PO TABS: 15.0000 mg | ORAL_TABLET | Freq: Every day | ORAL | 2 refills | Status: DC

## 2019-08-05 NOTE — Progress Notes (Signed)
   SUBJECTIVE 63 year old female presenting today for follow up evaluation of bilateral ankle pain. She states she was doing well and the injections helped resolve her pain until three days ago. She has been taking Meloxicam and elevating the ankles for treatment. There are no aggravating factors noted at this time.  She also complains of elongated, thickened nails 1-5 noted bilaterally that cause pain while ambulating in shoes. She is unable to trim her own nails. Patient is here for further evaluation and treatment.   Past Medical History:  Diagnosis Date  . Arthritis   . Diverticulitis   . Heart murmur   . Hypertension     OBJECTIVE General Patient is awake, alert, and oriented x 3 and in no acute distress. Derm Skin is dry and supple bilateral. Negative open lesions or macerations. Remaining integument unremarkable. Nails are tender, long, thickened and dystrophic with subungual debris, consistent with onychomycosis, 1-5 bilateral. No signs of infection noted. Vasc  DP and PT pedal pulses palpable bilaterally. Temperature gradient within normal limits.  Neuro Epicritic and protective threshold sensation grossly intact bilaterally.  Musculoskeletal Exam Pain with palpation noted to the anterior, lateral and medial aspects of the bilateral ankle joints. No symptomatic pedal deformities noted bilateral. Muscular strength within normal limits.  ASSESSMENT 1. Onychodystrophic nails 1-5 bilateral with hyperkeratosis of nails.  2. Onychomycosis of nail due to dermatophyte bilateral 3. Ankle synovitis bilateral   PLAN OF CARE 1. Patient evaluated today.  2. Instructed to maintain good pedal hygiene and foot care.  3. Mechanical debridement of nails 1-5 bilaterally performed using a nail nipper. Filed with dremel without incident.  4. Injection of 0.5 mLs Celestone Soluspan injected into the bilateral ankle joints.  5. Refill prescription for Meloxicam provided to patient. 6. Return to  clinic in 3 mos.    Edrick Kins, DPM [facility]  Dr. Edrick Kins, [address]                                        [place], [place] [address]                Office [phone]  Fax [phone]

## 2019-09-05 ENCOUNTER — Other Ambulatory Visit: Payer: Self-pay

## 2019-09-05 ENCOUNTER — Ambulatory Visit: Payer: Medicaid Other | Attending: Family Medicine | Admitting: Family Medicine

## 2019-09-05 ENCOUNTER — Encounter: Payer: Self-pay | Admitting: Family Medicine

## 2019-09-05 ENCOUNTER — Other Ambulatory Visit: Payer: Self-pay | Admitting: Pulmonary Disease

## 2019-09-05 ENCOUNTER — Other Ambulatory Visit: Payer: Self-pay | Admitting: Critical Care Medicine

## 2019-09-05 VITALS — BP 163/112 | HR 85 | Resp 16 | Wt 309.0 lb

## 2019-09-05 DIAGNOSIS — R748 Abnormal levels of other serum enzymes: Secondary | ICD-10-CM | POA: Diagnosis not present

## 2019-09-05 DIAGNOSIS — Z1231 Encounter for screening mammogram for malignant neoplasm of breast: Secondary | ICD-10-CM | POA: Diagnosis not present

## 2019-09-05 DIAGNOSIS — Z791 Long term (current) use of non-steroidal anti-inflammatories (NSAID): Secondary | ICD-10-CM | POA: Insufficient documentation

## 2019-09-05 DIAGNOSIS — M5441 Lumbago with sciatica, right side: Secondary | ICD-10-CM

## 2019-09-05 DIAGNOSIS — Z79899 Other long term (current) drug therapy: Secondary | ICD-10-CM | POA: Diagnosis not present

## 2019-09-05 DIAGNOSIS — I10 Essential (primary) hypertension: Secondary | ICD-10-CM | POA: Diagnosis present

## 2019-09-05 DIAGNOSIS — R600 Localized edema: Secondary | ICD-10-CM | POA: Diagnosis not present

## 2019-09-05 DIAGNOSIS — Z6841 Body Mass Index (BMI) 40.0 and over, adult: Secondary | ICD-10-CM

## 2019-09-05 DIAGNOSIS — R350 Frequency of micturition: Secondary | ICD-10-CM | POA: Insufficient documentation

## 2019-09-05 DIAGNOSIS — M1990 Unspecified osteoarthritis, unspecified site: Secondary | ICD-10-CM | POA: Insufficient documentation

## 2019-09-05 DIAGNOSIS — Z1211 Encounter for screening for malignant neoplasm of colon: Secondary | ICD-10-CM | POA: Diagnosis not present

## 2019-09-05 DIAGNOSIS — Z8249 Family history of ischemic heart disease and other diseases of the circulatory system: Secondary | ICD-10-CM | POA: Insufficient documentation

## 2019-09-05 DIAGNOSIS — R7989 Other specified abnormal findings of blood chemistry: Secondary | ICD-10-CM | POA: Diagnosis not present

## 2019-09-05 DIAGNOSIS — R5383 Other fatigue: Secondary | ICD-10-CM | POA: Diagnosis not present

## 2019-09-05 DIAGNOSIS — G8929 Other chronic pain: Secondary | ICD-10-CM | POA: Diagnosis not present

## 2019-09-05 MED ORDER — AMLODIPINE BESYLATE 5 MG PO TABS: 5.0000 mg | ORAL_TABLET | Freq: Every day | ORAL | 3 refills | Status: DC

## 2019-09-05 NOTE — Patient Instructions (Addendum)
Continue your current blood pressure medications and new medication, amlodipine 5 mg once per day added. Follow-up with clinical pharmacist in about 4 weeks regarding your blood pressure.   Hypertension, Adult Hypertension is another name for high blood pressure. High blood pressure forces your heart to work harder to pump blood. This can cause problems over time. There are two numbers in a blood pressure reading. There is a top number (systolic) over a bottom number (diastolic). It is best to have a blood pressure that is below 120/80. Healthy choices can help lower your blood pressure, or you may need medicine to help lower it. What are the causes? The cause of this condition is not known. Some conditions may be related to high blood pressure. What increases the risk?  Smoking.  Having type 2 diabetes mellitus, high cholesterol, or both.  Not getting enough exercise or physical activity.  Being overweight.  Having too much fat, sugar, calories, or salt (sodium) in your diet.  Drinking too much alcohol.  Having long-term (chronic) kidney disease.  Having a family history of high blood pressure.  Age. Risk increases with age.  Race. You may be at higher risk if you are African American.  Gender. Men are at higher risk than women before age 14. After age 49, women are at higher risk than men.  Having obstructive sleep apnea.  Stress. What are the signs or symptoms?  High blood pressure may not cause symptoms. Very high blood pressure (hypertensive crisis) may cause: ? Headache. ? Feelings of worry or nervousness (anxiety). ? Shortness of breath. ? Nosebleed. ? A feeling of being sick to your stomach (nausea). ? Throwing up (vomiting). ? Changes in how you see. ? Very bad chest pain. ? Seizures. How is this treated?  This condition is treated by making healthy lifestyle changes, such as: ? Eating healthy foods. ? Exercising more. ? Drinking less alcohol.  Your  health care provider may prescribe medicine if lifestyle changes are not enough to get your blood pressure under control, and if: ? Your top number is above 130. ? Your bottom number is above 80.  Your personal target blood pressure may vary. Follow these instructions at home: Eating and drinking   If told, follow the DASH eating plan. To follow this plan: ? Fill one half of your plate at each meal with fruits and vegetables. ? Fill one fourth of your plate at each meal with whole grains. Whole grains include whole-wheat pasta, brown rice, and whole-grain bread. ? Eat or drink low-fat dairy products, such as skim milk or low-fat yogurt. ? Fill one fourth of your plate at each meal with low-fat (lean) proteins. Low-fat proteins include fish, chicken without skin, eggs, beans, and tofu. ? Avoid fatty meat, cured and processed meat, or chicken with skin. ? Avoid pre-made or processed food.  Eat less than 1,500 mg of salt each day.  Do not drink alcohol if: ? Your doctor tells you not to drink. ? You are pregnant, may be pregnant, or are planning to become pregnant.  If you drink alcohol: ? Limit how much you use to:  0-1 drink a day for women.  0-2 drinks a day for men. ? Be aware of how much alcohol is in your drink. In the [place] one drink equals one 12 oz bottle of beer (355 mL), one 5 oz glass of wine (148 mL), or one 1 oz glass of hard liquor (44 mL). Lifestyle   Work with your  doctor to stay at a healthy weight or to lose weight. Ask your doctor what the best weight is for you.  Get at least 30 minutes of exercise most days of the week. This may include walking, swimming, or biking.  Get at least 30 minutes of exercise that strengthens your muscles (resistance exercise) at least 3 days a week. This may include lifting weights or doing Pilates.  Do not use any products that contain nicotine or tobacco, such as cigarettes, e-cigarettes, and chewing tobacco. If you need help  quitting, ask your doctor.  Check your blood pressure at home as told by your doctor.  Keep all follow-up visits as told by your doctor. This is important. Medicines  Take over-the-counter and prescription medicines only as told by your doctor. Follow directions carefully.  Do not skip doses of blood pressure medicine. The medicine does not work as well if you skip doses. Skipping doses also puts you at risk for problems.  Ask your doctor about side effects or reactions to medicines that you should watch for. Contact a doctor if you:  Think you are having a reaction to the medicine you are taking.  Have headaches that keep coming back (recurring).  Feel dizzy.  Have swelling in your ankles.  Have trouble with your vision. Get help right away if you:  Get a very bad headache.  Start to feel mixed up (confused).  Feel weak or numb.  Feel faint.  Have very bad pain in your: ? Chest. ? Belly (abdomen).  Throw up more than once.  Have trouble breathing. Summary  Hypertension is another name for high blood pressure.  High blood pressure forces your heart to work harder to pump blood.  For most people, a normal blood pressure is less than 120/80.  Making healthy choices can help lower blood pressure. If your blood pressure does not get lower with healthy choices, you may need to take medicine. This information is not intended to replace advice given to you by your health care provider. Make sure you discuss any questions you have with your health care provider. Document Revised: 04/26/2018 Document Reviewed: 04/26/2018 Elsevier Patient Education  2020 [employer].

## 2019-09-05 NOTE — Progress Notes (Signed)
Established Patient Office Visit  Subjective:  Patient ID: [name], female    DOB: April 06, 1956  Age: 64 y.o. MRN: [id number]  CC:  Chief Complaint  Patient presents with  . Follow-up    HPI [name], 64 yo African -American female, who is seen in follow-up of chronic medical issues including essential hypertension, chronic low back pain, peripheral edema, elevated liver enzymes with a history of etoh use and she is s/p surgical intervention for right radial head/elbow fracture.  She is status post a fall with injury to the elbow and she states that after surgery her elbow pain is mostly gone except for when she has repeat use of her elbow and sometimes with full extension of the elbow she has some discomfort.  She continues to have issues with bilateral low back pain which can radiate to either leg and sometimes both legs at the same time.  She reports that she is remaining compliant with her blood pressure medications.  She however is under an increased amount of stress that she also cares for her mother who has dementia.  She denies any current issues with headaches or dizziness but can develop headaches if she forgets to take her blood pressure medication or if she runs out of her blood pressure medication.  She denies any current issues with chest pain or palpitations.  She does have continued issues with lower extremity edema and she takes her Lasix which improves the swelling.  She denies any issues with abdominal pain-no nausea/vomiting/diarrhea or constipation.  She has seen no blood in the stool and no dark or black stools.  She agrees to referral for screening colonoscopy.  She denies any dysuria but has urinary frequency with use of her Lasix.  She denies any increased thirst or blurred vision.  She does have fatigue and additionally continues have sharp pain which radiates from her lower back to the right leg.  Pain can range from a 6-8 and is worse with activity.  Past Medical  History:  Diagnosis Date  . Arthritis   . Diverticulitis   . Heart murmur   . Hypertension     Past Surgical History:  Procedure Laterality Date  . RADIAL HEAD ARTHROPLASTY Right 06/25/2019   Procedure: RIGHT RADIAL HEAD ARTHROPLASTY;  Surgeon: [name], MD;  Location: [facility];  Service: Orthopedics;  Laterality: Right;  . TUBAL LIGATION      Family History  Problem Relation Age of Onset  . Hypertension Mother     Social History   Socioeconomic History  . Marital status: Legally Separated    Spouse name: Not on file  . Number of children: Not on file  . Years of education: Not on file  . Highest education level: Not on file  Occupational History  . Not on file  Tobacco Use  . Smoking status: Never Smoker  . Smokeless tobacco: Never Used  Substance and Sexual Activity  . Alcohol use: Yes    Alcohol/week: 14.0 standard drinks    Types: 14 Shots of liquor per week    Comment: EOD-weekends  . Drug use: No  . Sexual activity: Not Currently  Other Topics Concern  . Not on file  Social History Narrative  . Not on file   Social Determinants of Health   Financial Resource Strain:   . Difficulty of Paying Living Expenses: Not on file  Food Insecurity:   . Worried About Charity fundraiser in the Last Year: Not on  file  . [place] in the Last Year: Not on file  Transportation Needs:   . Lack of Transportation (Medical): Not on file  . Lack of Transportation (Non-Medical): Not on file  Physical Activity:   . Days of Exercise per Week: Not on file  . Minutes of Exercise per Session: Not on file  Stress:   . Feeling of Stress : Not on file  Social Connections:   . Frequency of Communication with Friends and Family: Not on file  . Frequency of Social Gatherings with Friends and Family: Not on file  . Attends Religious Services: Not on file  . Active Member of Clubs or Organizations: Not on file  . Attends Archivist Meetings: Not on file  .  Marital Status: Not on file  Intimate Partner Violence:   . Fear of Current or Ex-Partner: Not on file  . Emotionally Abused: Not on file  . Physically Abused: Not on file  . Sexually Abused: Not on file    Outpatient Medications Prior to Visit  Medication Sig Dispense Refill  . benazepril (LOTENSIN) 20 MG tablet Take 1 tablet (20 mg total) by mouth daily. To lower blood pressure (Patient not taking: Reported on 09/05/2019) 90 tablet 1  . Blood Pressure Monitoring ([employer] ADVANCED BP MONITOR) DEVI Measure blood pressure daily and record in diary 1 Device 0  . furosemide (LASIX) 40 MG tablet Take 1 tablet (40 mg total) by mouth daily. 30 tablet 2  . HYDROcodone-acetaminophen (NORCO) 5-325 MG tablet Take 1-2 tablets by mouth 2 (two) times daily as needed. (Patient not taking: Reported on 09/05/2019) 30 tablet 0  . meloxicam (MOBIC) 15 MG tablet Take 1 tablet (15 mg total) by mouth daily. 30 tablet 2  . metoprolol tartrate (LOPRESSOR) 50 MG tablet Take 1 tablet (50 mg total) by mouth 2 (two) times daily. 60 tablet 6  . Multiple Vitamins-Minerals (CENTRUM SILVER ADULT 50+) TABS Take 1 tablet by mouth daily.     . Multiple Vitamins-Minerals (HAIR SKIN AND NAILS FORMULA PO) Take 1 tablet by mouth daily at 2 PM.    . ondansetron (ZOFRAN) 4 MG tablet Take 1-2 tablets (4-8 mg total) by mouth every 8 (eight) hours as needed for nausea or vomiting. (Patient not taking: Reported on 09/05/2019) 40 tablet 0  . oxyCODONE-acetaminophen (PERCOCET) 5-325 MG tablet Take 1-2 tablets by mouth 2 (two) times daily as needed for severe pain. (Patient not taking: Reported on 09/05/2019) 30 tablet 0  . potassium chloride SA (K-DUR) 20 MEQ tablet Take 1 tablet (20 mEq total) by mouth daily. 30 tablet 3   No facility-administered medications prior to visit.    No Known Allergies  ROS Review of Systems  Constitutional: Positive for fatigue. Negative for chills and fever.  HENT: Negative for sore throat and trouble swallowing.    Eyes: Negative for photophobia and visual disturbance.  Respiratory: Negative for cough and shortness of breath.   Cardiovascular: Positive for leg swelling. Negative for chest pain and palpitations.  Gastrointestinal: Negative for abdominal pain, blood in stool, constipation and diarrhea.  Endocrine: Positive for polyuria (with lasix use). Negative for polydipsia and polyphagia.  Genitourinary: Positive for frequency. Negative for dysuria and flank pain.  Musculoskeletal: Positive for back pain and gait problem.  Neurological: Positive for headaches (occasional when out of BP medication). Negative for dizziness.  Hematological: Negative for adenopathy. Does not bruise/bleed easily.  Psychiatric/Behavioral: Negative for self-injury and suicidal ideas. The patient is nervous/anxious (primary  caregiver for mother who has dementia).       Objective:    Physical Exam  Constitutional: She is oriented to person, place, and time. She appears well-developed and well-nourished.  Morbidly obese older female in NAD sitting on chair in exam room wearing mask as per office COVID-19 precautions  Cardiovascular: Normal rate and regular rhythm.  Heart sounds are somewhat distant due to body habitus and unable to tell if slight murmur is present  Pulmonary/Chest: Effort normal and breath sounds normal.  Abdominal: Soft. There is abdominal tenderness. There is no rebound and no guarding.  Truncal obesity; mild discomfort at lower area of pannus  Musculoskeletal:        General: Edema present. No tenderness.  Neurological: She is alert and oriented to person, place, and time.  Skin:  Dry skin with some hypertrophic skin changes bilateral LE right greater than left and dry thickened skin on the heels  Psychiatric: She has a normal mood and affect. Her behavior is normal.  Nursing note and vitals reviewed.   BP (!) 163/112   Pulse 85   Resp 16   Wt (!) 309 lb (140.2 kg)   SpO2 97%   BMI 58.39  kg/m  Wt Readings from Last 3 Encounters:  09/05/19 (!) 309 lb (140.2 kg)  06/25/19 (!) 307 lb (139.3 kg)  06/15/19 (!) 306 lb (138.8 kg)     Health Maintenance Due  Topic Date Due  . COLONOSCOPY  08/17/2005  . PAP SMEAR-Modifier  01/26/2009  . MAMMOGRAM  05/23/2010   Health maintenance discussed and patient agrees to be referred for screening colonoscopy and screening mammogram  Lab Results  Component Value Date   TSH 3.360 11/20/2018   Lab Results  Component Value Date   WBC 6.7 06/15/2019   HGB 11.5 06/15/2019   HCT 33.3 (L) 06/15/2019   MCV 97 06/15/2019   PLT 265 06/15/2019   Lab Results  Component Value Date   NA 143 06/15/2019   K 4.5 06/15/2019   CO2 26 06/15/2019   GLUCOSE 104 (H) 06/15/2019   BUN 29 (H) 06/15/2019   CREATININE 1.30 (H) 06/15/2019   BILITOT 0.4 06/15/2019   ALKPHOS 54 06/15/2019   AST 59 (H) 06/15/2019   ALT 45 (H) 06/15/2019   PROT 8.5 06/15/2019   ALBUMIN 4.3 06/15/2019   CALCIUM 9.6 06/15/2019   ANIONGAP 19 (H) 11/03/2018   Lab Results  Component Value Date   CHOL 232 (H) 05/17/2008   Lab Results  Component Value Date   HDL 88 05/17/2008   Lab Results  Component Value Date   LDLCALC 133 (H) 05/17/2008   Lab Results  Component Value Date   TRIG 54 05/17/2008   Lab Results  Component Value Date   CHOLHDL 2.6 Ratio 05/17/2008   No results found for: HGBA1C    Assessment & Plan:  1. Essential hypertension Blood pressure is elevated at today's visit despite being on current benazepril 20 mg, Lasix 40 mg, hydrochlorothiazide 25 mg, metoprolol 50 mg twice daily and Klor-Con 20 mEq daily..  We will add amlodipine 5 mg once daily and patient is to follow-up in a few weeks with clinical pharmacist for blood pressure recheck and to see if change is needed such as an increase to 10 mg of amlodipine if blood pressure is not below 140/90 upon recheck.  Patient is encouraged to follow a low sodium/DASH diet. - Amb Referral to  Clinical Pharmacist - amLODipine (NORVASC) 5 MG tablet;  Take 1 tablet (5 mg total) by mouth daily. To lower blood pressure  Dispense: 90 tablet; Refill: 3  2. Elevated serum creatinine We will recheck electrolytes as part of comprehensive metabolic panel was patient with increase in creatinine to 1.30 on most recent blood work done on 06/15/2019.  This may be related to use of nonsteroidal anti-inflammatory medication/Cox 2 inhibitors to help of chronic back pain.  If creatinine remains elevated, she may need to stop the use of her current Mobic and avoid other nonsteroidal anti-inflammatory medications. - Comprehensive metabolic panel  3. Elevated liver enzymes We will recheck LFTs this patient with elevated liver enzymes on most recent comprehensive metabolic panel done in October.  She does have history of alcohol use and is encouraged to discontinue use of alcohol, Tylenol/acetaminophen and follow a low-fat diet to help avoid additional injury to the liver - Comprehensive metabolic panel  4. Chronic bilateral low back pain with right-sided sciatica She has continued chronic low back pain with radiation to the right leg for which she will be referred to orthopedics for further evaluation and treatment. - Ambulatory referral to Orthopedic Surgery  5. Morbid obesity with BMI of 50.0-59.9, adult ([place]) She is encouraged to start a low-fat, low-carb diet with exercise as tolerated with goal of weight loss which would help with her blood pressure and may also help if elevated liver enzymes are related to fatty liver disease.  6. Screening for colon cancer On health maintenance, patient is due for colonoscopy and she agrees to be referred to gastroenterology for screening for colon cancer - Ambulatory referral to Gastroenterology  7. Encounter for screening mammogram for breast cancer She agrees to be referred for mammogram as a screening test for breast cancer - MM Digital Screening;  Future   An After Visit Summary was printed and given to the patient.   Follow-up: Return in about 4 months (around 01/03/2020) for chronic issues; 4 week follow-up with CPP/Luke.   [name], MD

## 2019-09-25 ENCOUNTER — Telehealth: Payer: Self-pay | Admitting: General Practice

## 2019-09-25 NOTE — Telephone Encounter (Signed)
Patient name and DOB verified. Patient c/o swelling in BLE.  Denies pitting edema. She noticed since starting amlodipine after OV, 09/05/2019.   Please advise.

## 2019-09-25 NOTE — Telephone Encounter (Signed)
Patient stated that the past 2 days her ankles, feet and knees have been really swollen. Patient stated that she has been drinking a lot of water and taking her medications and they seem to not be working. Patient stated that her legs are very hard and she has not been urinating as much as she has been taking in. Please fu at your earliest convenience.

## 2019-09-25 NOTE — Telephone Encounter (Signed)
[name], If the swelling is only minimal, please ask her to keep her follow-up visit with Lurena Joiner next week. Also ask if the swelling goes away/lessens overnight. She can watch her salt intake and elevate her feet when she gets a chance to help with the swelling. If she feels that the swelling is bothersome/greater than mild then let me know.

## 2019-09-25 NOTE — Telephone Encounter (Signed)
Please have her continue amlodipine until her follow-up with Lurena Joiner on Feb 2 as long as she is not having significant swelling

## 2019-09-26 NOTE — Telephone Encounter (Signed)
Patient name and DOB verified. States she is sitting on the chair with her feet in the air. Patient aware to continue amlodipine per PCP. Reminded of appt with Clinical Pharmacist.

## 2019-10-03 ENCOUNTER — Ambulatory Visit: Payer: Medicaid Other | Admitting: Pharmacist

## 2019-10-17 ENCOUNTER — Other Ambulatory Visit: Payer: Self-pay

## 2019-10-17 ENCOUNTER — Ambulatory Visit: Payer: Medicaid Other | Attending: Family Medicine | Admitting: Pharmacist

## 2019-10-17 VITALS — BP 131/85 | HR 105

## 2019-10-17 DIAGNOSIS — R600 Localized edema: Secondary | ICD-10-CM | POA: Diagnosis not present

## 2019-10-17 DIAGNOSIS — I10 Essential (primary) hypertension: Secondary | ICD-10-CM | POA: Diagnosis not present

## 2019-10-17 DIAGNOSIS — Z8249 Family history of ischemic heart disease and other diseases of the circulatory system: Secondary | ICD-10-CM | POA: Diagnosis not present

## 2019-10-17 DIAGNOSIS — R7989 Other specified abnormal findings of blood chemistry: Secondary | ICD-10-CM

## 2019-10-17 DIAGNOSIS — Z79899 Other long term (current) drug therapy: Secondary | ICD-10-CM | POA: Diagnosis not present

## 2019-10-17 MED ORDER — METOPROLOL TARTRATE 50 MG PO TABS: 50.0000 mg | ORAL_TABLET | Freq: Two times a day (BID) | ORAL | 3 refills | Status: DC

## 2019-10-17 MED ORDER — HYDROCHLOROTHIAZIDE 25 MG PO TABS: 25.0000 mg | ORAL_TABLET | Freq: Every day | ORAL | 1 refills | Status: DC

## 2019-10-17 NOTE — Progress Notes (Signed)
   S:    PCP: Dr. Chapman Fitch  Patient arrives in good spirits. Presents to the clinic for BP check and evaluation of BLE edema reported after starting amlodipine.  Patient was referred and last seen by Primary Care Provider on 09/05/2019.    Patient reports adherence with medications. She does endorse BLE edema, specifically in the ankles since starting amlodipine. She has a history of edema and takes Lasix, but reports that Lasix has stopped controlling her edema since addition of amlodipine.   Current BP Medications include:  Amlodipine 5 mg daily, benazepril 20 mg daily, furosemide 40 mg daily, metoprolol tartrate 50 mg BID  Dietary habits include: limits salt; denies drinking caffeine  Exercise habits include: limited  Family / Social history:  - FHx: HTN (mother) - Tobacco: never smoker  - Alcohol: reports use; per chart, 14 shots/wk   O:  Vitals:   10/17/19 1521  BP: 131/85  Pulse: (!) 105   Home BP readings: none   Last 3 Office BP readings: BP Readings from Last 3 Encounters:  09/05/19 (!) 163/112  06/25/19 (!) 128/92  06/17/19 (!) 174/78   BMET    Component Value Date/Time   NA 143 06/15/2019 1622   K 4.5 06/15/2019 1622   CL 100 06/15/2019 1622   CO2 26 06/15/2019 1622   GLUCOSE 104 (H) 06/15/2019 1622   GLUCOSE 116 (H) 11/03/2018 1627   BUN 29 (H) 06/15/2019 1622   CREATININE 1.30 (H) 06/15/2019 1622   CALCIUM 9.6 06/15/2019 1622   GFRNONAA 44 (L) 06/15/2019 1622   GFRAA 50 (L) 06/15/2019 1622    Renal function: CrCl cannot be calculated (Patient's most recent lab result is older than the maximum 21 days allowed.).  Clinical ASCVD: No  The ASCVD Risk score Mikey Bussing DC Jr., et al., 2013) failed to calculate for the following reasons:   Cannot find a previous HDL lab   Cannot find a previous total cholesterol lab  A/P: Hypertension longstanding currently close to goal on current medications. BP Goal = <130/80 mmHg. Patient reports adherence with medication.    Will stop amlodipine and start HCTZ 25 mg daily. Will get labs today to evaluate electrolyte status and renal function. -Discontinued amlodipine. -Start HCTZ 25 mg daily.  -Continue benazepril 20 mg daily.  -F/u labs ordered - BMP -Counseled on lifestyle modifications for blood pressure control including reduced dietary sodium, increased exercise, adequate sleep  Results reviewed and written information provided.   Total time in face-to-face counseling 15 minutes.   F/U Clinic Visit with PCP.  Benard Halsted, PharmD, [place] [phone]

## 2019-10-17 NOTE — Patient Instructions (Addendum)
Thank you for coming to see [place] today.   Blood pressure today looks better. To help with the swelling, stop amlodipine.   Start taking HCTZ 25 mg daily. Re-start your metoprolol.   Limiting salt and caffeine, as well as exercising as able for at least 30 minutes for 5 days out of the week, can also help you lower your blood pressure.  Take your blood pressure at home if you are able. Please write down these numbers and bring them to your visits.  If you have any questions about medications, please call me [phone].  Lurena Joiner

## 2019-10-18 LAB — BASIC METABOLIC PANEL WITH GFR
BUN/Creatinine Ratio: 11 — ABNORMAL LOW (ref 12–28)
BUN: 13 mg/dL (ref 8–27)
CO2: 22 mmol/L (ref 20–29)
Calcium: 9.5 mg/dL (ref 8.7–10.3)
Chloride: 96 mmol/L (ref 96–106)
Creatinine, Ser: 1.21 mg/dL — ABNORMAL HIGH (ref 0.57–1.00)
GFR calc Af Amer: 55 mL/min/1.73 — ABNORMAL LOW
GFR calc non Af Amer: 47 mL/min/1.73 — ABNORMAL LOW
Glucose: 107 mg/dL — ABNORMAL HIGH (ref 65–99)
Potassium: 4.4 mmol/L (ref 3.5–5.2)
Sodium: 138 mmol/L (ref 134–144)

## 2019-10-24 ENCOUNTER — Other Ambulatory Visit: Payer: Self-pay | Admitting: Family Medicine

## 2019-10-24 ENCOUNTER — Other Ambulatory Visit: Payer: Self-pay

## 2019-10-24 ENCOUNTER — Ambulatory Visit
Admission: RE | Admit: 2019-10-24 | Discharge: 2019-10-24 | Disposition: A | Discharge status: Home / Self Care | Payer: Medicaid Other | Source: Ambulatory Visit | Attending: Family Medicine | Admitting: Family Medicine

## 2019-10-24 DIAGNOSIS — Z1231 Encounter for screening mammogram for malignant neoplasm of breast: Secondary | ICD-10-CM | POA: Diagnosis not present

## 2019-10-24 IMAGING — MG DIGITAL SCREENING BILAT W/ TOMO W/ CAD
6 of 12 series · 6 of 36 positions shown · non-contrast
Comparison: Previous exam(s).

CLINICAL DATA: Screening.

EXAM:
DIGITAL SCREENING BILATERAL MAMMOGRAM WITH TOMO AND CAD

[R CC synth-2D (1 of 2)]
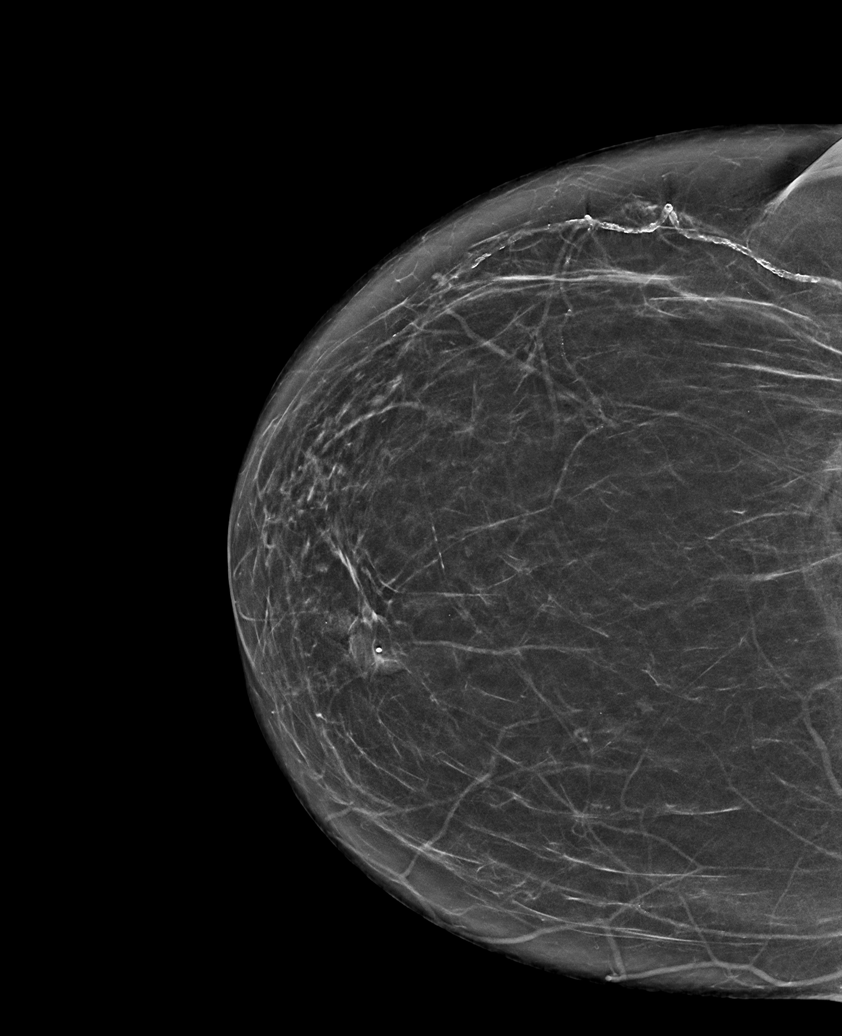

[R MLO synth-2D]
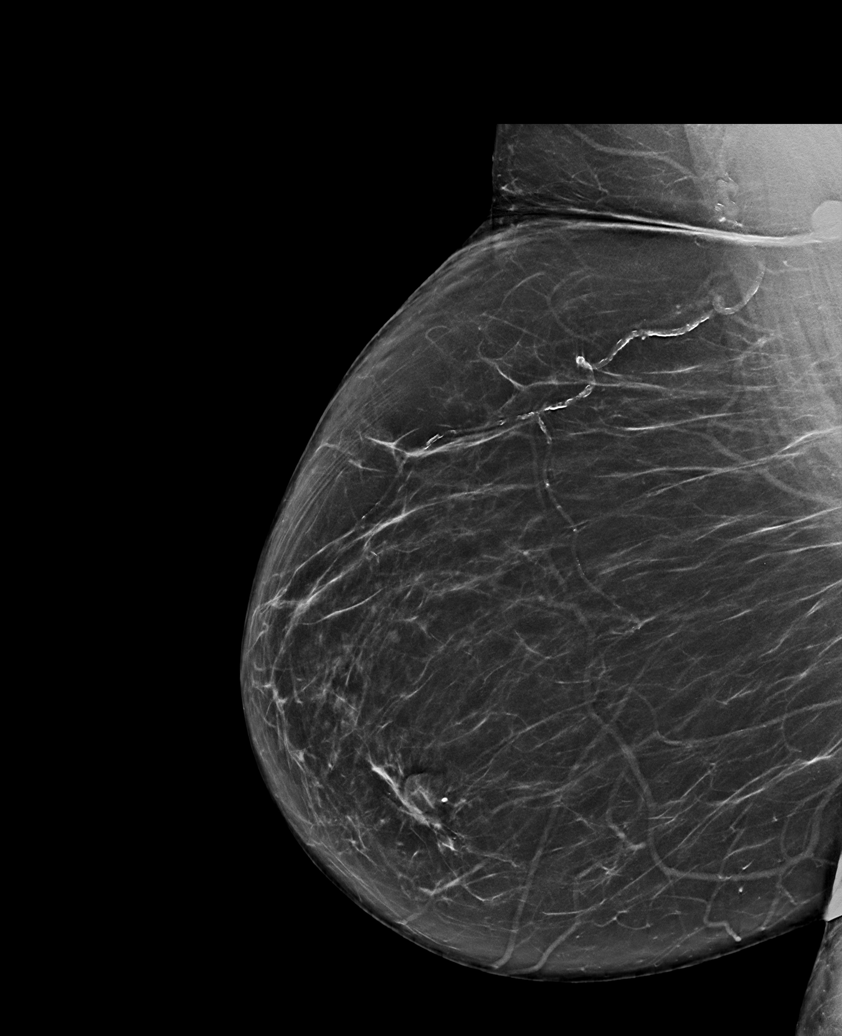

[L CC synth-2D (1 of 2)]
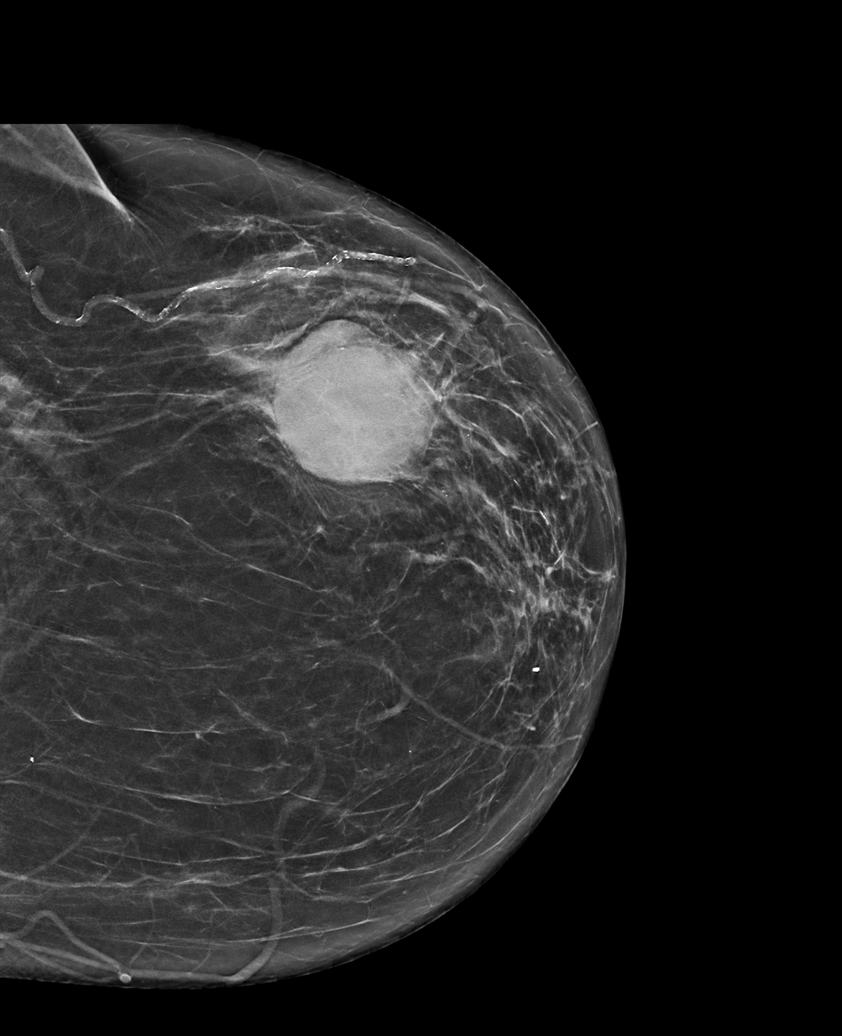

[L CC synth-2D (2 of 2)]
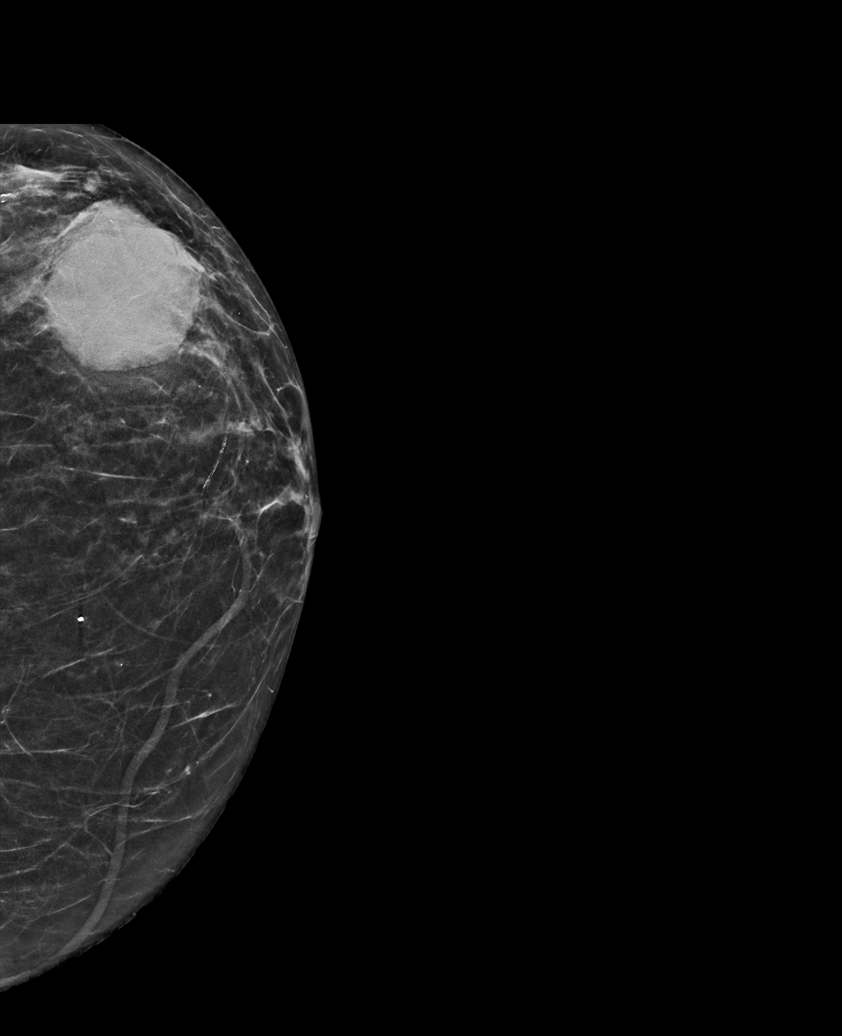

[R CC synth-2D (2 of 2)]
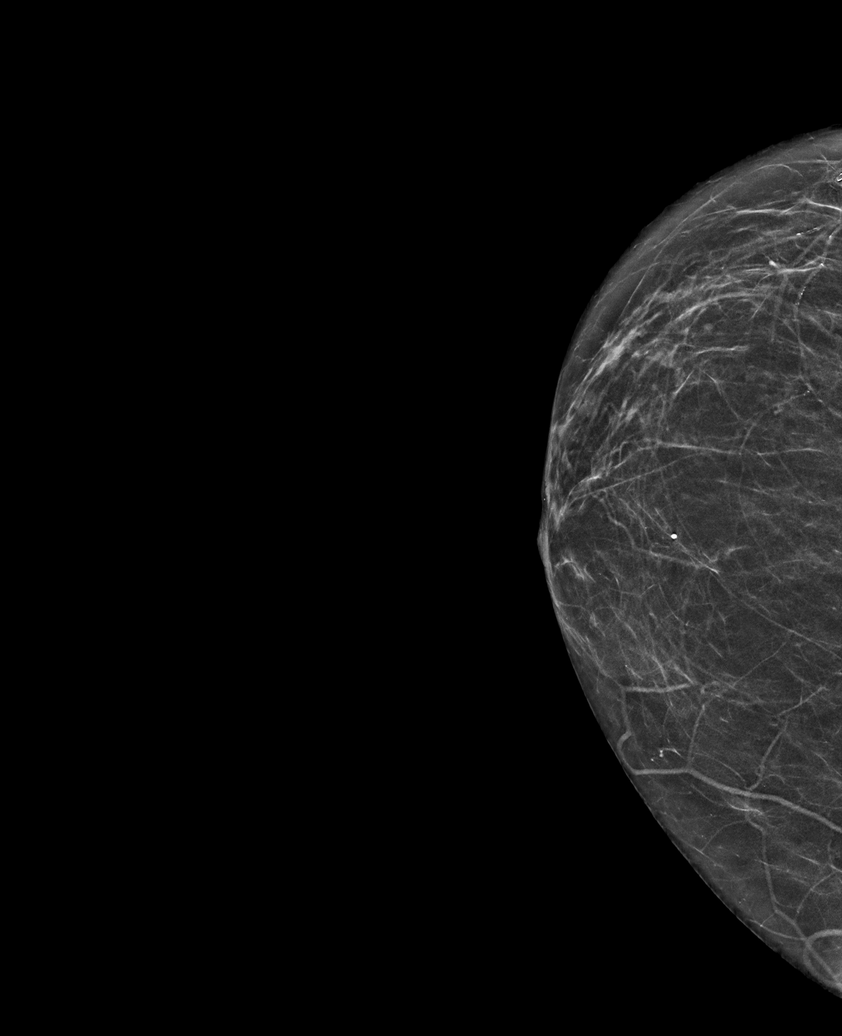

[L MLO synth-2D]
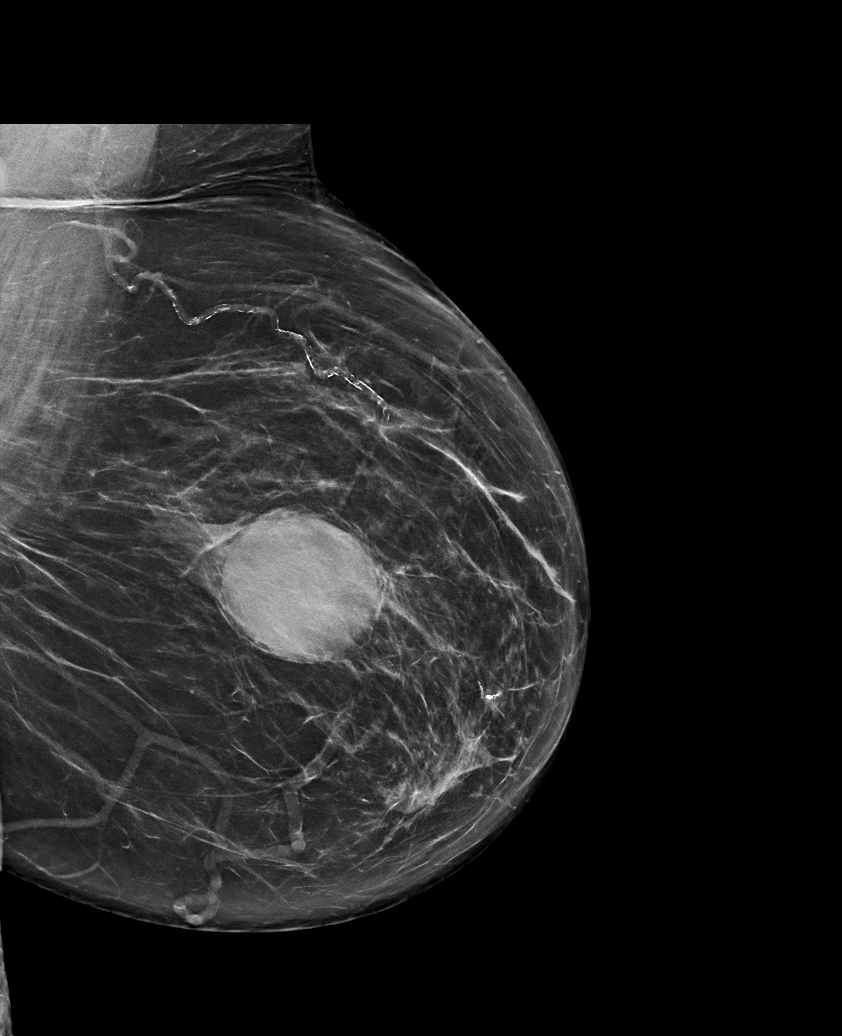

[6 of 36 positions shown; findings below may reference images not displayed]

ACR Breast Density Category b: There are scattered areas of
fibroglandular density.
FINDINGS: In the left breast, a possible mass warrants further evaluation. In
the right breast, no findings suspicious for malignancy.

Images were processed with CAD.
IMPRESSION: Further evaluation is suggested for possible mass in the left
breast.

RECOMMENDATION:
Diagnostic mammogram and possibly ultrasound of the left breast.
(Code:[R1])

The patient will be contacted regarding the findings, and additional
imaging will be scheduled.

BI-RADS CATEGORY  0: Incomplete. Need additional imaging evaluation
and/or prior mammograms for comparison.

## 2019-10-26 ENCOUNTER — Other Ambulatory Visit: Payer: Self-pay | Admitting: Family Medicine

## 2019-10-26 DIAGNOSIS — R928 Other abnormal and inconclusive findings on diagnostic imaging of breast: Secondary | ICD-10-CM

## 2019-10-29 ENCOUNTER — Other Ambulatory Visit: Payer: Self-pay | Admitting: Family Medicine

## 2019-10-31 ENCOUNTER — Ambulatory Visit: Payer: Medicaid Other | Admitting: Podiatry

## 2019-11-03 ENCOUNTER — Other Ambulatory Visit: Payer: Self-pay | Admitting: Critical Care Medicine

## 2019-11-03 ENCOUNTER — Other Ambulatory Visit: Payer: Self-pay | Admitting: Family Medicine

## 2019-11-03 DIAGNOSIS — I10 Essential (primary) hypertension: Secondary | ICD-10-CM

## 2019-11-05 ENCOUNTER — Other Ambulatory Visit: Payer: Self-pay | Admitting: Pulmonary Disease

## 2019-11-05 ENCOUNTER — Other Ambulatory Visit: Payer: Self-pay | Admitting: Family Medicine

## 2019-11-05 ENCOUNTER — Ambulatory Visit
Admission: RE | Admit: 2019-11-05 | Discharge: 2019-11-05 | Disposition: A | Discharge status: Home / Self Care | Payer: Medicaid Other | Source: Ambulatory Visit | Attending: Family Medicine | Admitting: Family Medicine

## 2019-11-05 ENCOUNTER — Telehealth: Payer: Self-pay | Admitting: General Practice

## 2019-11-05 ENCOUNTER — Other Ambulatory Visit: Payer: Self-pay

## 2019-11-05 DIAGNOSIS — N6323 Unspecified lump in the left breast, lower outer quadrant: Secondary | ICD-10-CM | POA: Diagnosis not present

## 2019-11-05 DIAGNOSIS — R928 Other abnormal and inconclusive findings on diagnostic imaging of breast: Secondary | ICD-10-CM

## 2019-11-05 DIAGNOSIS — N6320 Unspecified lump in the left breast, unspecified quadrant: Secondary | ICD-10-CM

## 2019-11-05 DIAGNOSIS — R2232 Localized swelling, mass and lump, left upper limb: Secondary | ICD-10-CM

## 2019-11-05 IMAGING — US US BREAST*L* LIMITED INC AXILLA
1 series · 14 of 16 positions shown · non-contrast
Comparison: Previous exam(s).

CLINICAL DATA: Patient was called back from screening mammogram for
a left breast mass.

EXAM:
ULTRASOUND OF THE LEFT BREAST

[Series 1: us breast*left* limited inc axilla · 0.07mm/px · 14 of 16 slices shown]
[im 1/16]
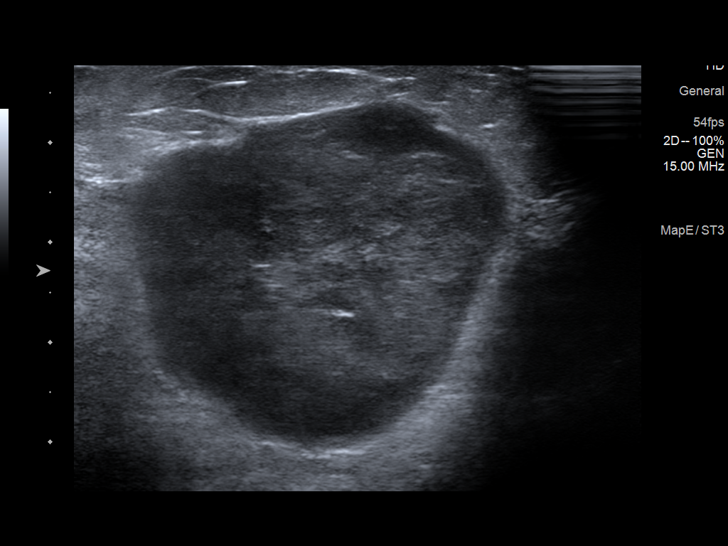
[im 2/16]
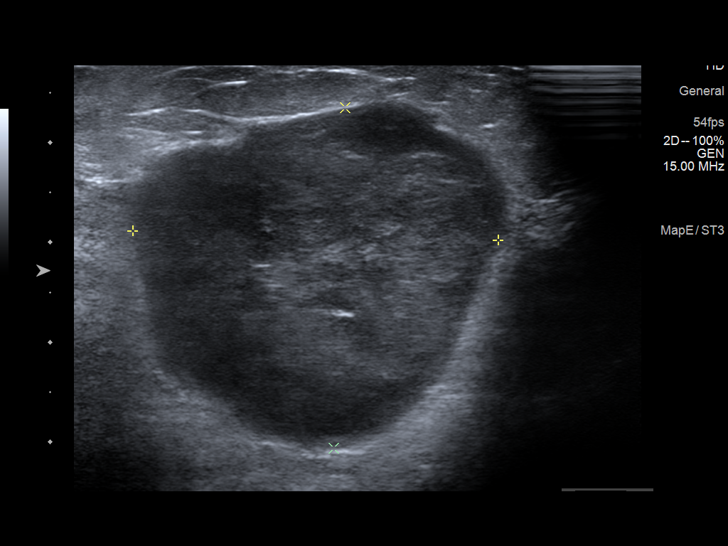
[im 3/16]
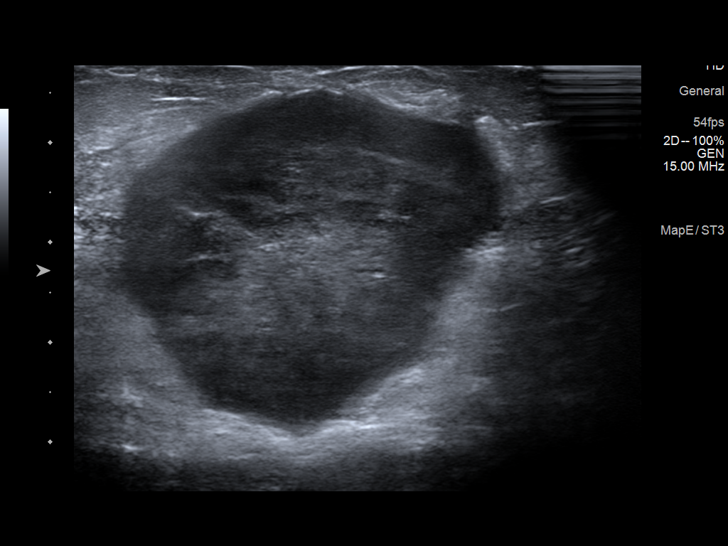
[im 5/16]
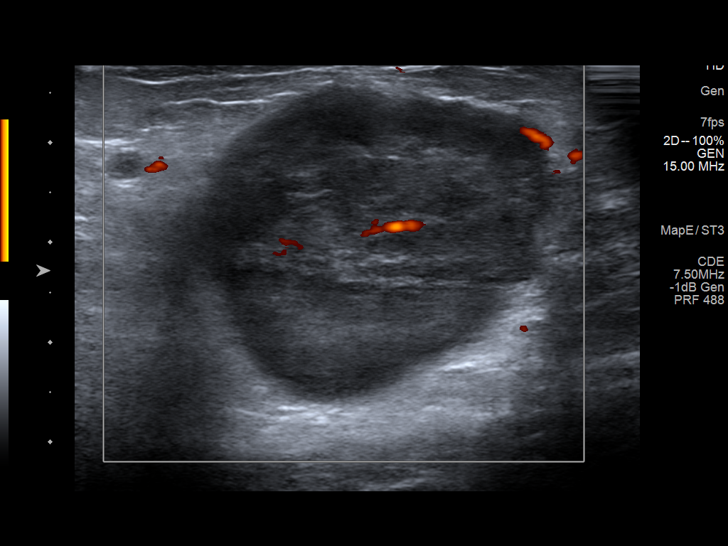
[im 6/16]
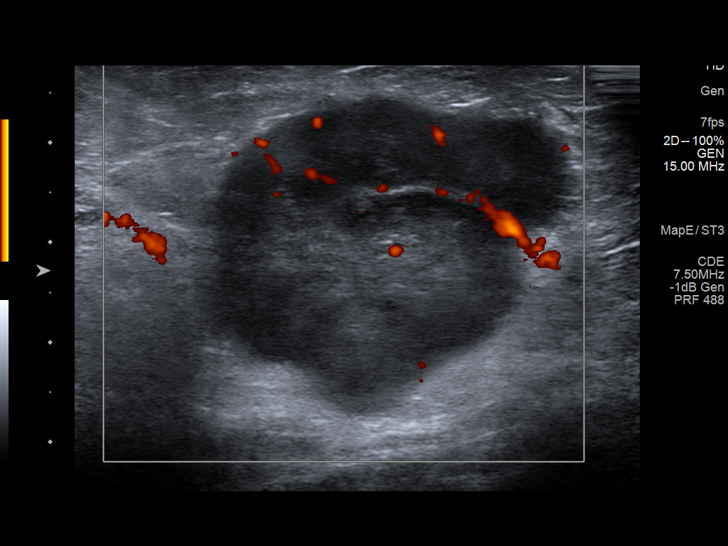
[im 7/16]
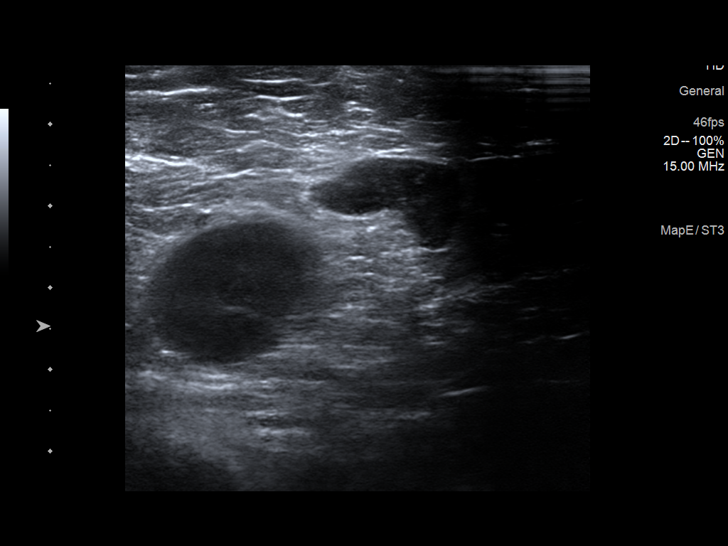
[im 8/16]
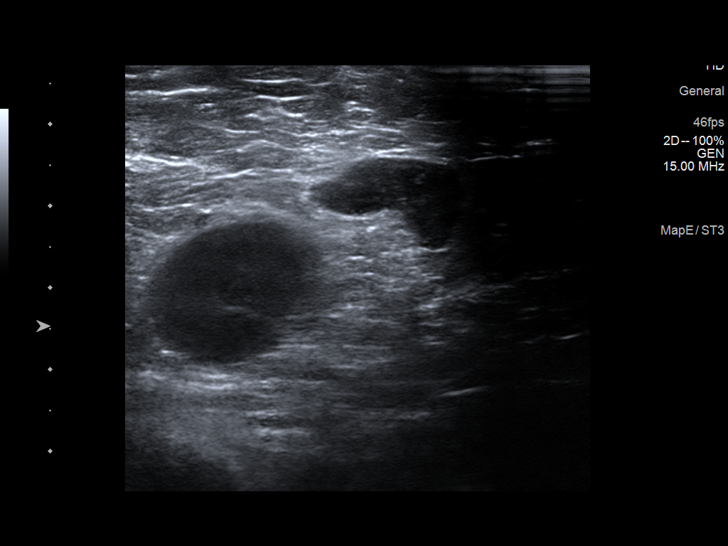
[im 9/16]
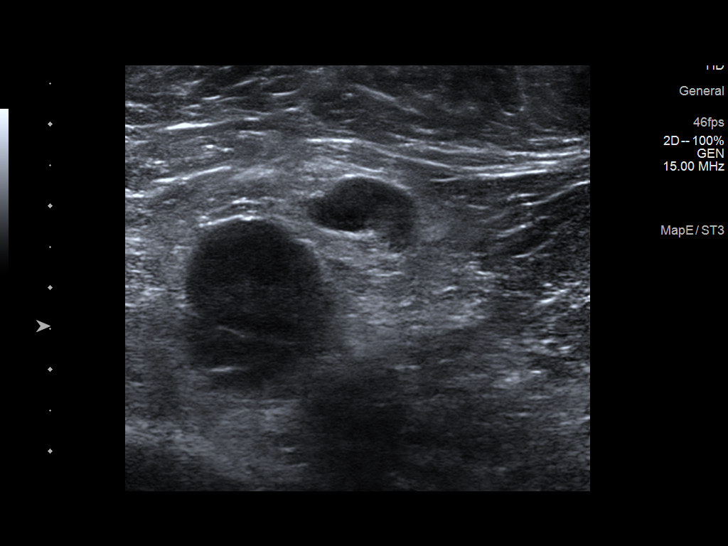
[im 10/16]
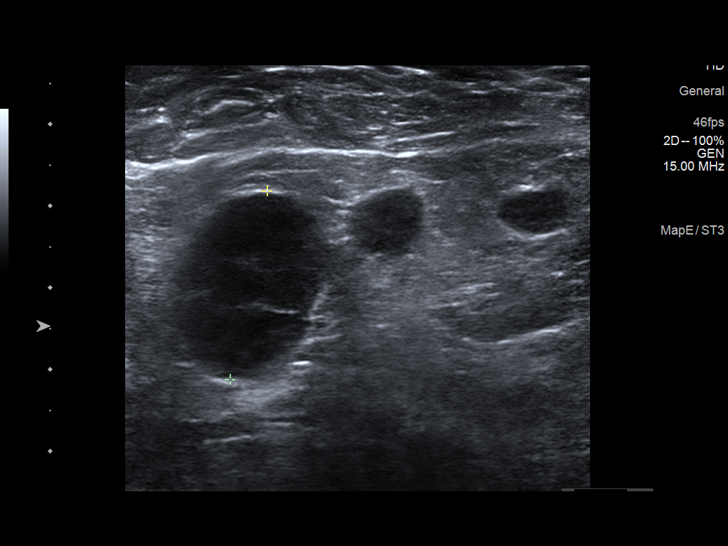
[im 11/16]
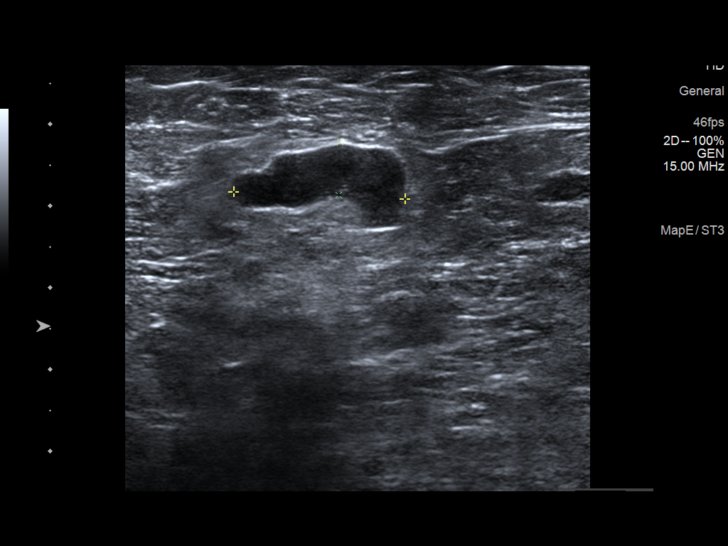
[im 13/16]
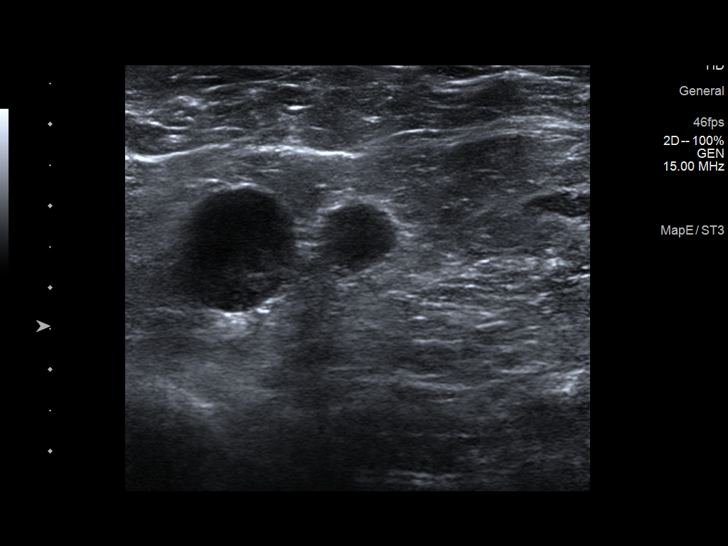
[im 14/16]
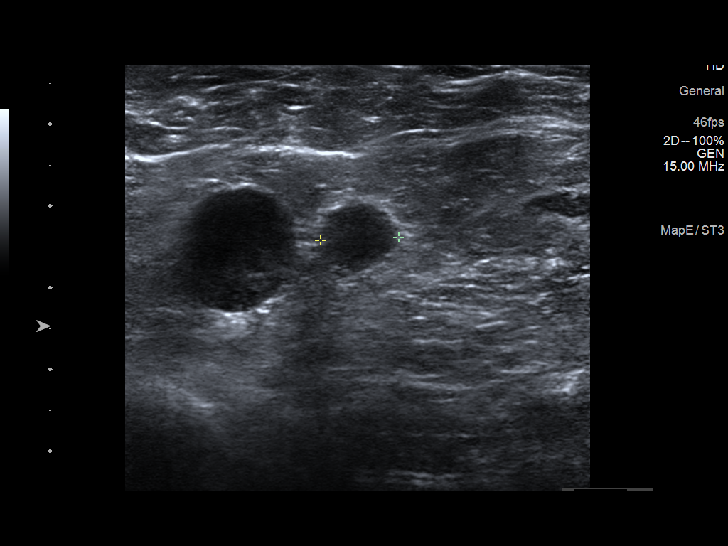
[im 15/16]
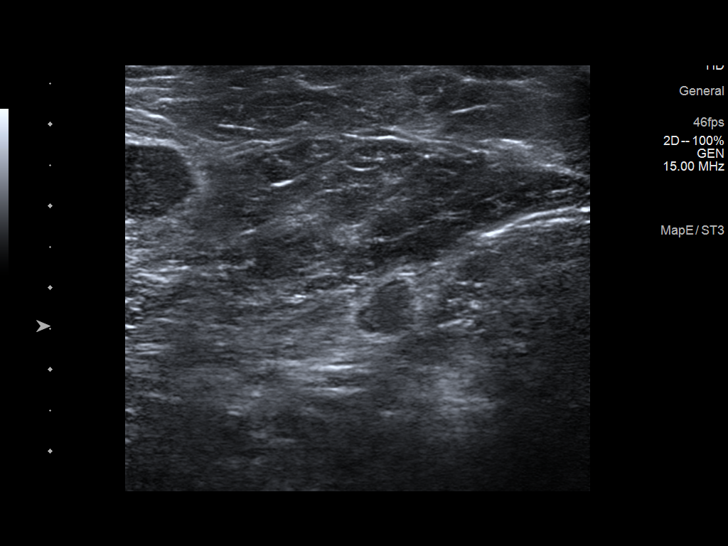
[im 16/16]
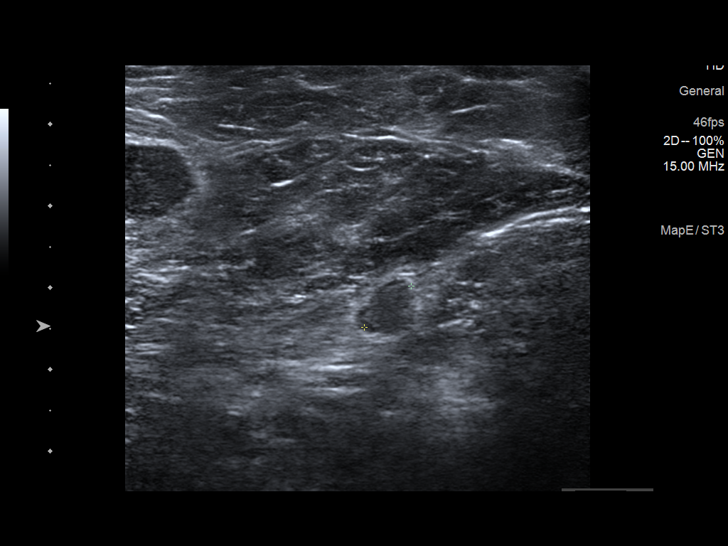

[14 of 16 positions shown; findings below may reference images not displayed]

FINDINGS: On physical exam, I palpate a discrete mass in the left breast at 3
o'clock 3 cm from the nipple.

Targeted ultrasound is performed, showing a solid hypoechoic mass
with internal blood flow in the left breast at 3 o'clock 3 cm from
the nipple measuring 3.7 x 3.4 x 3.8 cm. Sonographic evaluation of
the left axilla shows 5 abnormal lymph nodes. The biggest lymph node
measures 2.4 cm with no normal fatty hilum.
IMPRESSION: Suspicious mass in the 3 o'clock region of the left breast and
suspicious left axillary adenopathy.

RECOMMENDATION:
Ultrasound-guided core biopsy of the mass in the 3 o'clock region of
the left breast as well as an enlarged left axillary lymph node is
recommended.

I have discussed the findings and recommendations with the patient.
If applicable, a reminder letter will be sent to the patient
regarding the next appointment.

BI-RADS CATEGORY  5: Highly suggestive of malignancy.

## 2019-11-05 NOTE — Telephone Encounter (Signed)
Patient called and stated that she missed a call from [facility] clinic, patient was informed that a note was not placed if someone did. Patient stated she would listen to voicemail to try and figure out who called her.

## 2019-11-07 ENCOUNTER — Encounter: Payer: Self-pay | Admitting: Family Medicine

## 2019-11-12 ENCOUNTER — Ambulatory Visit
Admission: RE | Admit: 2019-11-12 | Discharge: 2019-11-12 | Disposition: A | Discharge status: Home / Self Care | Payer: Medicaid Other | Source: Ambulatory Visit | Attending: Family Medicine | Admitting: Family Medicine

## 2019-11-12 ENCOUNTER — Other Ambulatory Visit: Payer: Self-pay | Admitting: Family Medicine

## 2019-11-12 ENCOUNTER — Other Ambulatory Visit: Payer: Self-pay

## 2019-11-12 DIAGNOSIS — R928 Other abnormal and inconclusive findings on diagnostic imaging of breast: Secondary | ICD-10-CM

## 2019-11-12 DIAGNOSIS — R2232 Localized swelling, mass and lump, left upper limb: Secondary | ICD-10-CM

## 2019-11-12 DIAGNOSIS — N6321 Unspecified lump in the left breast, upper outer quadrant: Secondary | ICD-10-CM | POA: Diagnosis not present

## 2019-11-12 DIAGNOSIS — C50812 Malignant neoplasm of overlapping sites of left female breast: Secondary | ICD-10-CM | POA: Diagnosis not present

## 2019-11-12 DIAGNOSIS — C773 Secondary and unspecified malignant neoplasm of axilla and upper limb lymph nodes: Secondary | ICD-10-CM | POA: Diagnosis not present

## 2019-11-12 DIAGNOSIS — N6325 Unspecified lump in the left breast, overlapping quadrants: Secondary | ICD-10-CM | POA: Diagnosis not present

## 2019-11-12 DIAGNOSIS — R590 Localized enlarged lymph nodes: Secondary | ICD-10-CM | POA: Diagnosis not present

## 2019-11-12 DIAGNOSIS — Z170 Estrogen receptor positive status [ER+]: Secondary | ICD-10-CM | POA: Diagnosis not present

## 2019-11-12 IMAGING — US US BREAST BX W LOC DEV 1ST LESION IMG BX SPEC US GUIDE*L*
1 series · 12 of 16 positions shown · non-contrast
Comparison: Previous exam(s).
COMPARISON: Previous exam(s).

Addendum:
CLINICAL DATA: Biopsy of a left breast mass and left axillary node.

EXAM:
ULTRASOUND GUIDED LEFT BREAST CORE NEEDLE BIOPSY

[Series 1: us breast bx w loc dev 1st lesion img bx spec us g · 0.07mm/px · 12 of 16 slices shown]
[im 1/16]
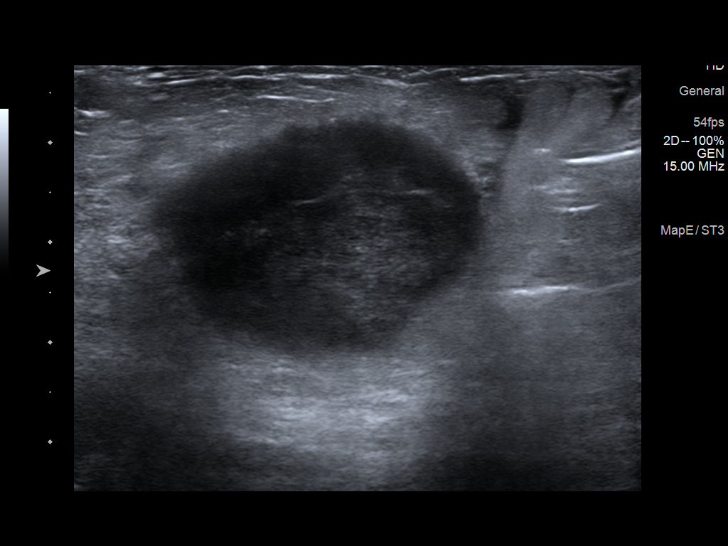
[im 3/16]
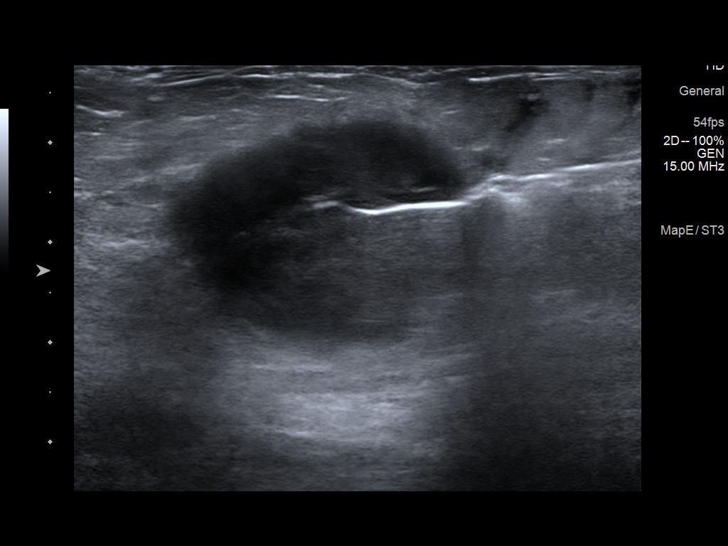
[im 4/16]
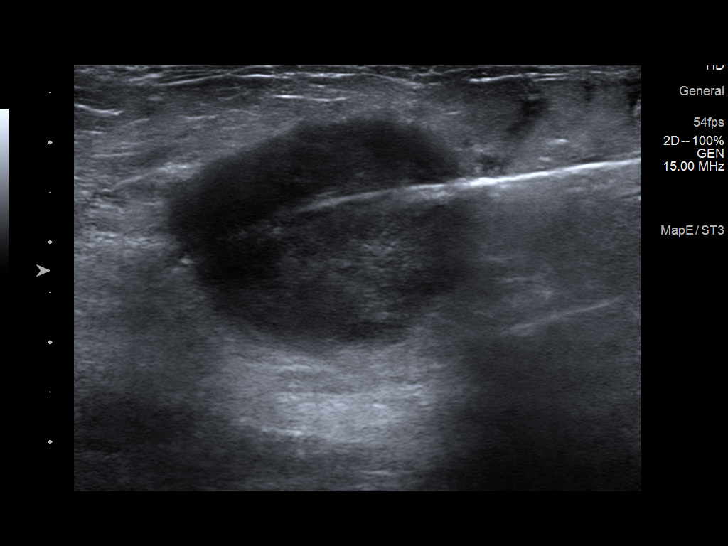
[im 5/16]
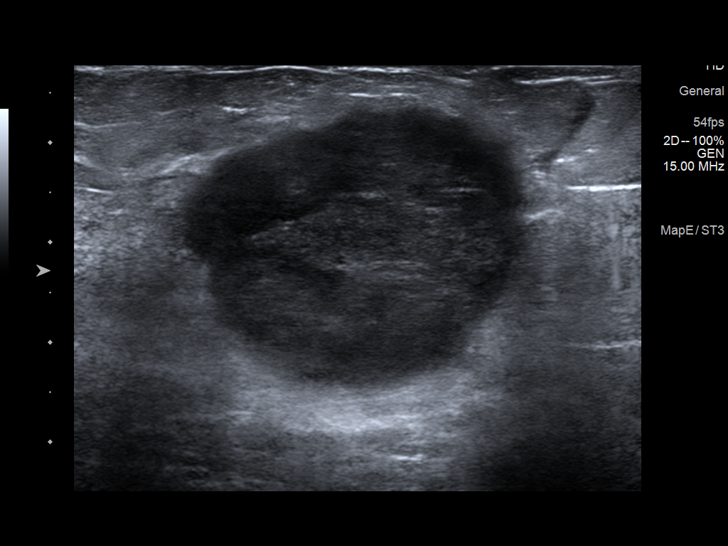
[im 7/16]
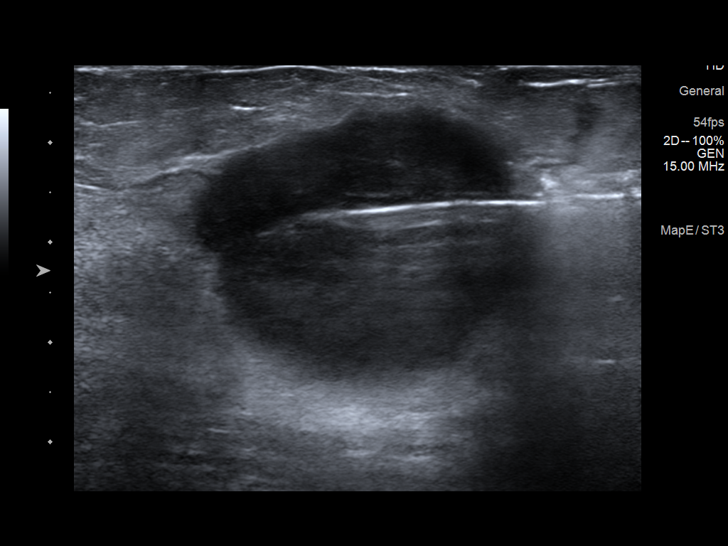
[im 8/16]
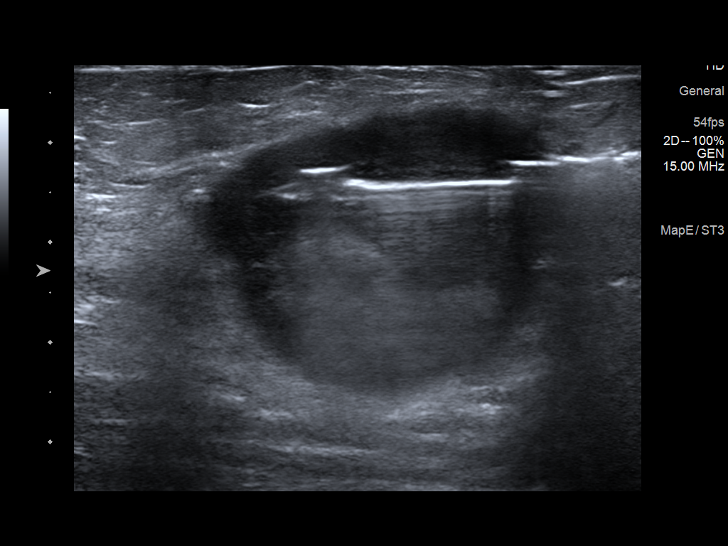
[im 9/16]
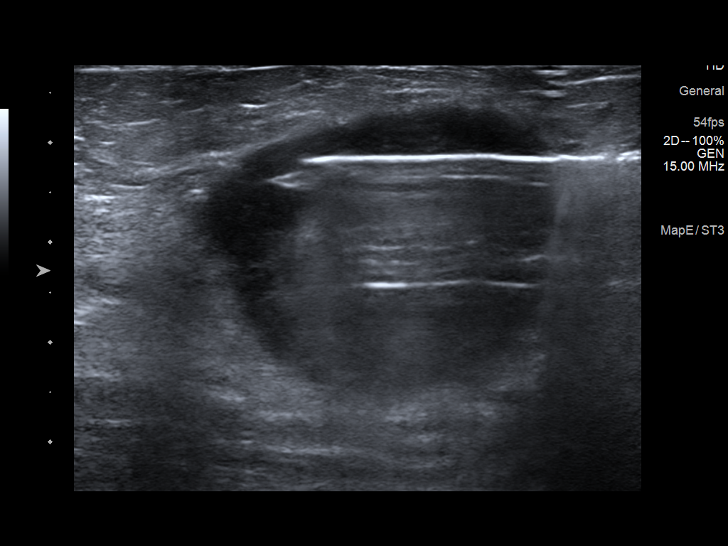
[im 11/16]
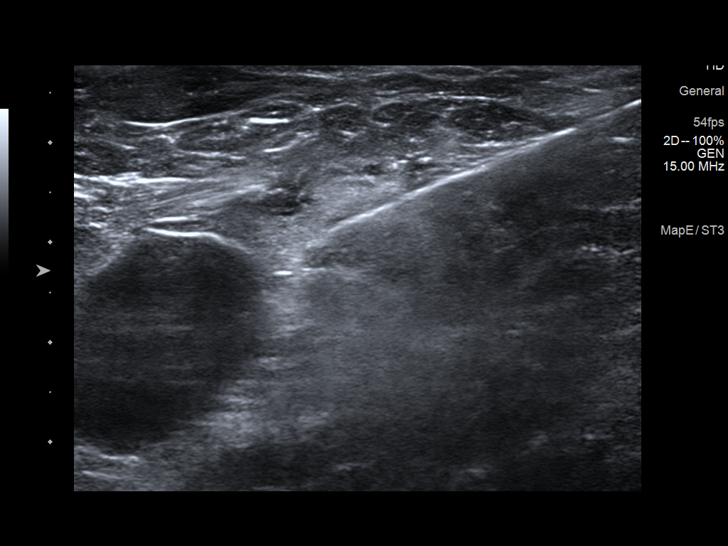
[im 12/16]
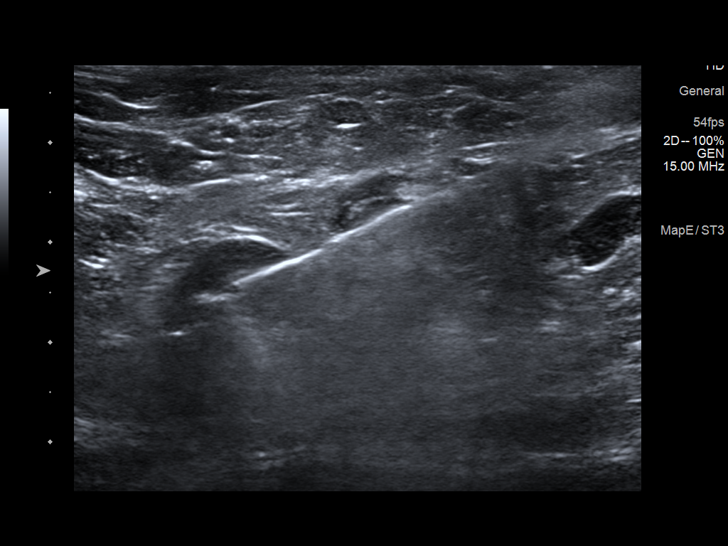
[im 13/16]
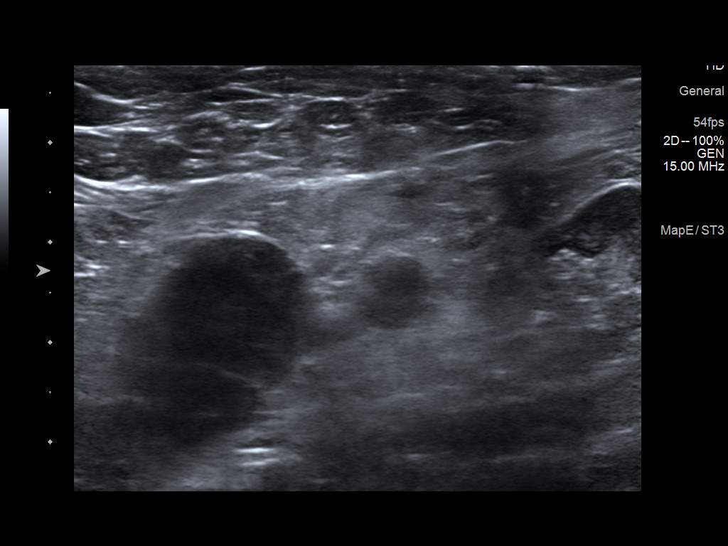
[im 15/16]
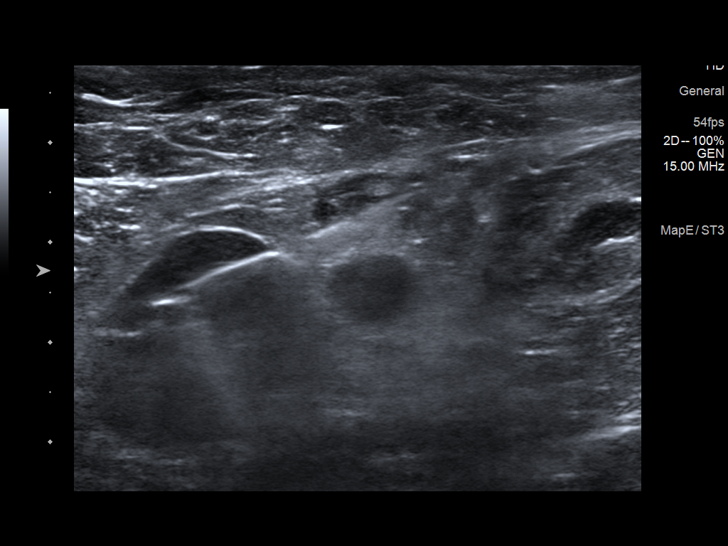
[im 16/16]
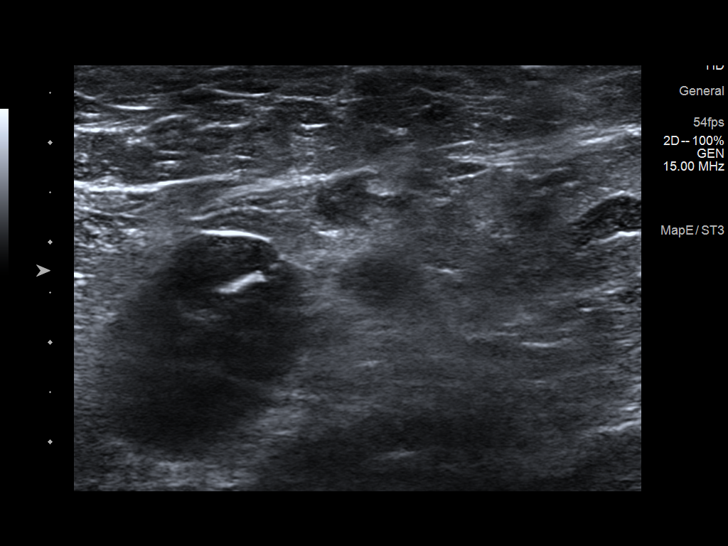

[12 of 16 positions shown; findings below may reference images not displayed]



Lesion quadrant: 3 o'clock

Using sterile technique and 1% Lidocaine as local anesthetic, under
direct ultrasound visualization, a 12 gauge DIAMOND DYCE device was
used to perform biopsy of a 3 o'clock left breast mass using a
lateral approach. At the conclusion of the procedure tissue marker
clip was deployed into the biopsy cavity. Follow up 2 view mammogram
was performed and dictated separately.

Lesion quadrant: Left axilla

Using sterile technique and 1% Lidocaine as local anesthetic, under
direct ultrasound visualization, a 14 gauge DIAMOND DYCE device was
used to perform biopsy of an abnormal left axillary node using a
lateral approach. At the conclusion of the procedure HydroMARK
tissue marker clip was deployed into the biopsy cavity. Follow up 2
view mammogram was performed and dictated separately.
IMPRESSION: Ultrasound guided biopsy of a left breast mass and a left axillary
lymph node. No apparent complications.

ADDENDUM:
Pathology revealed GRADE III INVASIVE DUCTAL CARCINOMA WITH
PROMINENT INFLAMMATORY RESPONSE of the Left breast, 3 o'clock. Based
on histologic features, differential diagnosis can include a
medullary carcinoma and lymphoepithelioma-like carcinoma. This was
found to be concordant by Dr. DIAMOND DYCE.

Pathology revealed METASTATIC BREAST CARCINOMA TO LYMPH NODE of the
Left axilla. This was found to be concordant by Dr. DIAMOND DYCE.

Pathology results were discussed with the patient by telephone. The
patient reported doing well after the biopsies with tenderness at
the sites. Post biopsy instructions and care were reviewed and
questions were answered. The patient was encouraged to call The

The patient was referred to [REDACTED]
[REDACTED] at [REDACTED] on
[DATE].

Pathology results reported by DIAMOND DYCE, RN on [DATE].



Lesion quadrant: 3 o'clock

Using sterile technique and 1% Lidocaine as local anesthetic, under
direct ultrasound visualization, a 12 gauge DIAMOND DYCE device was
used to perform biopsy of a 3 o'clock left breast mass using a
lateral approach. At the conclusion of the procedure tissue marker
clip was deployed into the biopsy cavity. Follow up 2 view mammogram
was performed and dictated separately.

Lesion quadrant: Left axilla

Using sterile technique and 1% Lidocaine as local anesthetic, under
direct ultrasound visualization, a 14 gauge DIAMOND DYCE device was
used to perform biopsy of an abnormal left axillary node using a
lateral approach. At the conclusion of the procedure HydroMARK
tissue marker clip was deployed into the biopsy cavity. Follow up 2
view mammogram was performed and dictated separately.
IMPRESSION: Ultrasound guided biopsy of a left breast mass and a left axillary
lymph node. No apparent complications.

## 2019-11-12 IMAGING — MG MM BREAST LOCALIZATION CLIP
6 series · 6 of 18 positions shown · non-contrast
Comparison: Previous exam(s).

CLINICAL DATA: Biopsy of a left breast mass and a left axillary
node. Evaluate clip placements.

EXAM:
DIAGNOSTIC LEFT MAMMOGRAM POST ULTRASOUND BIOPSY

[L ML synth-2D]
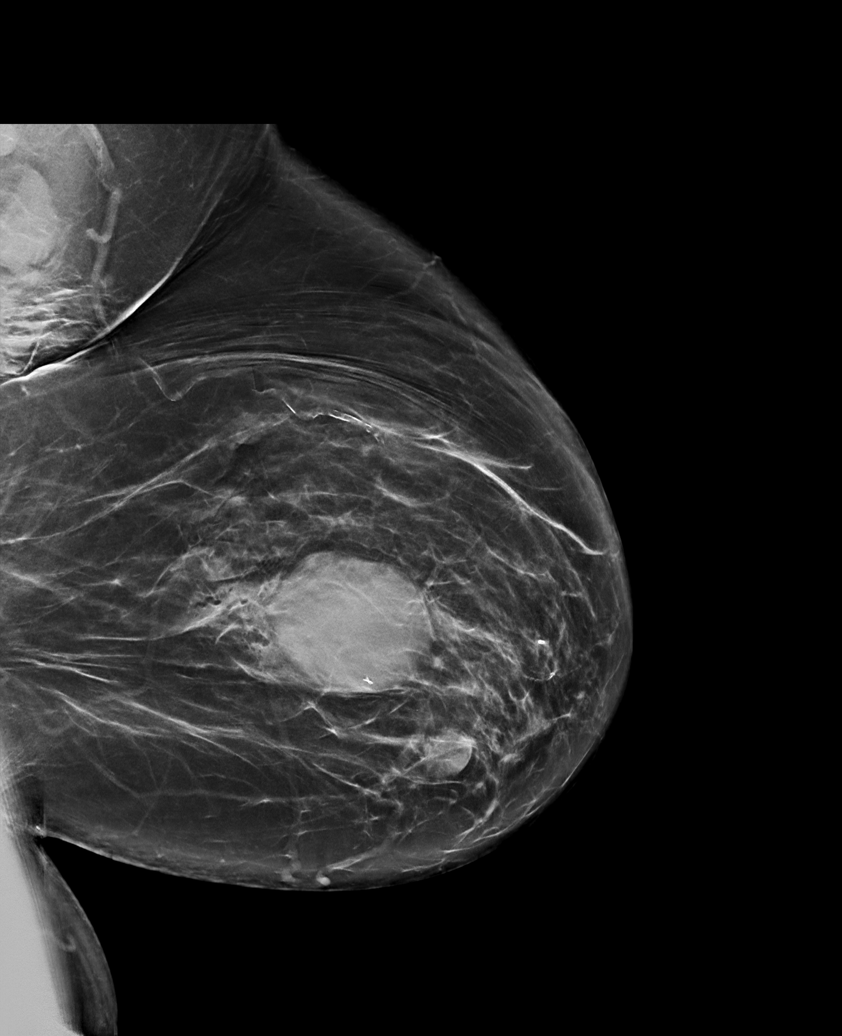

[L CC synth-2D]
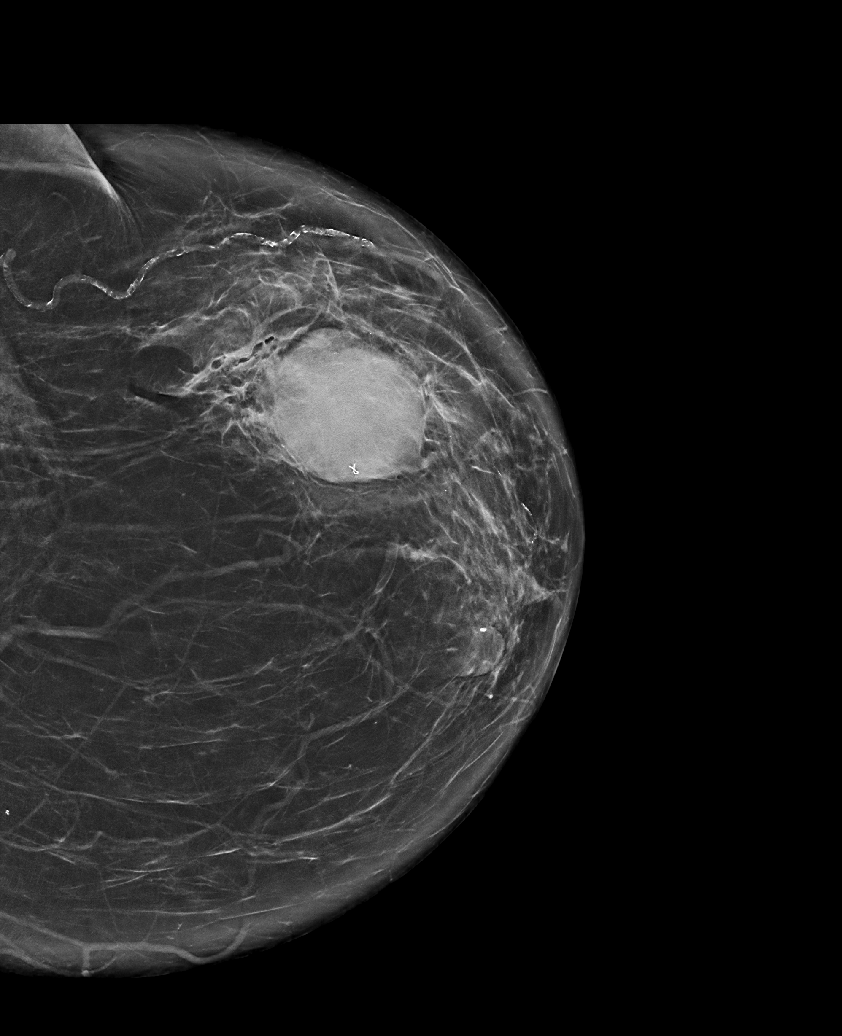

[L MLO synth-2D]
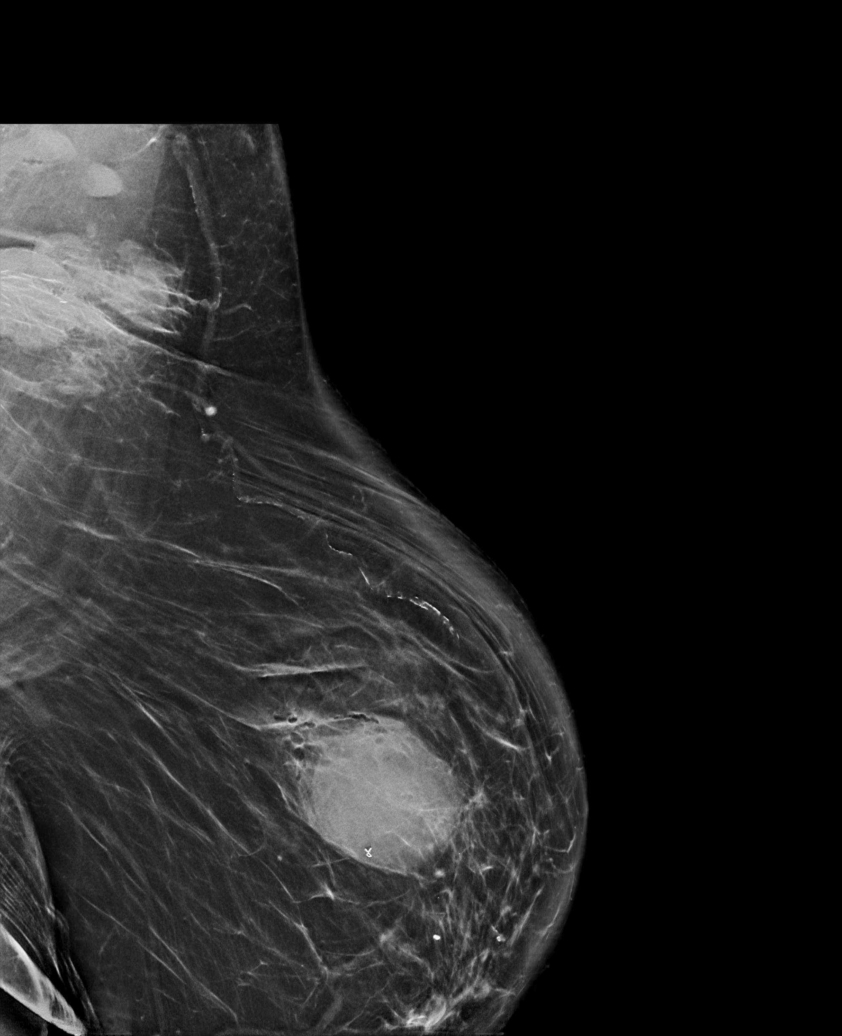

[L MLO tomo · tomo slice 53/105.0]
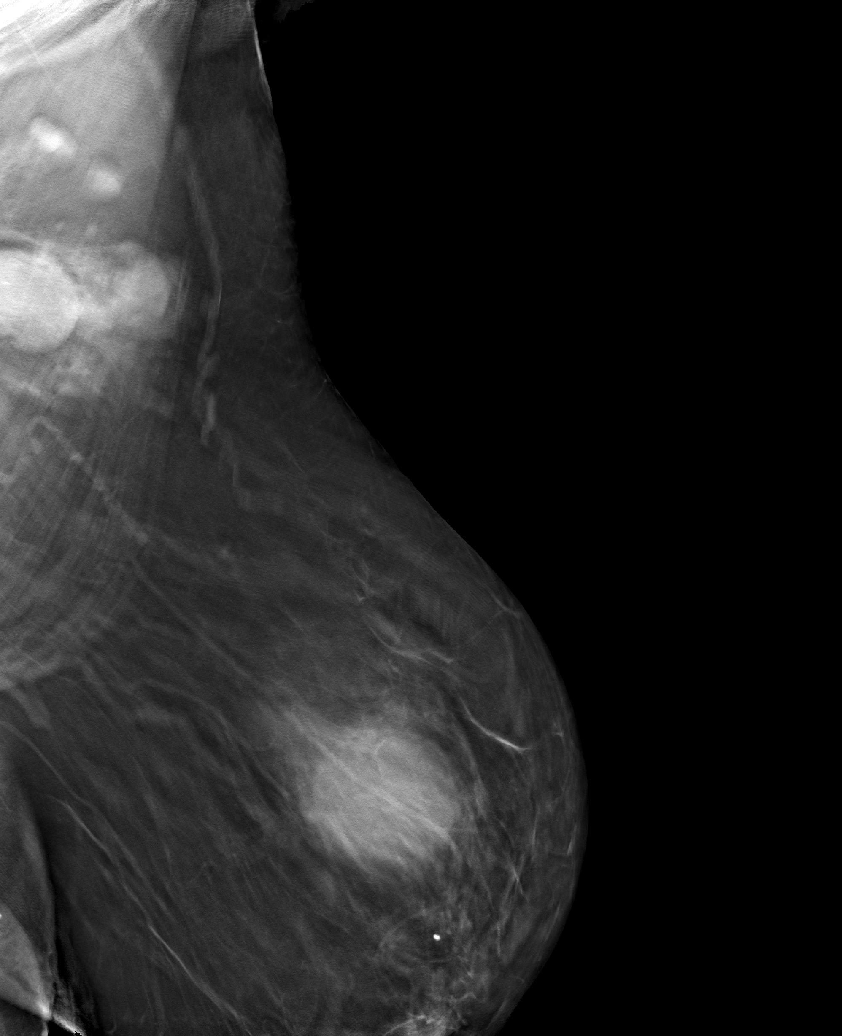

[L ML tomo · tomo slice 47/93.0]
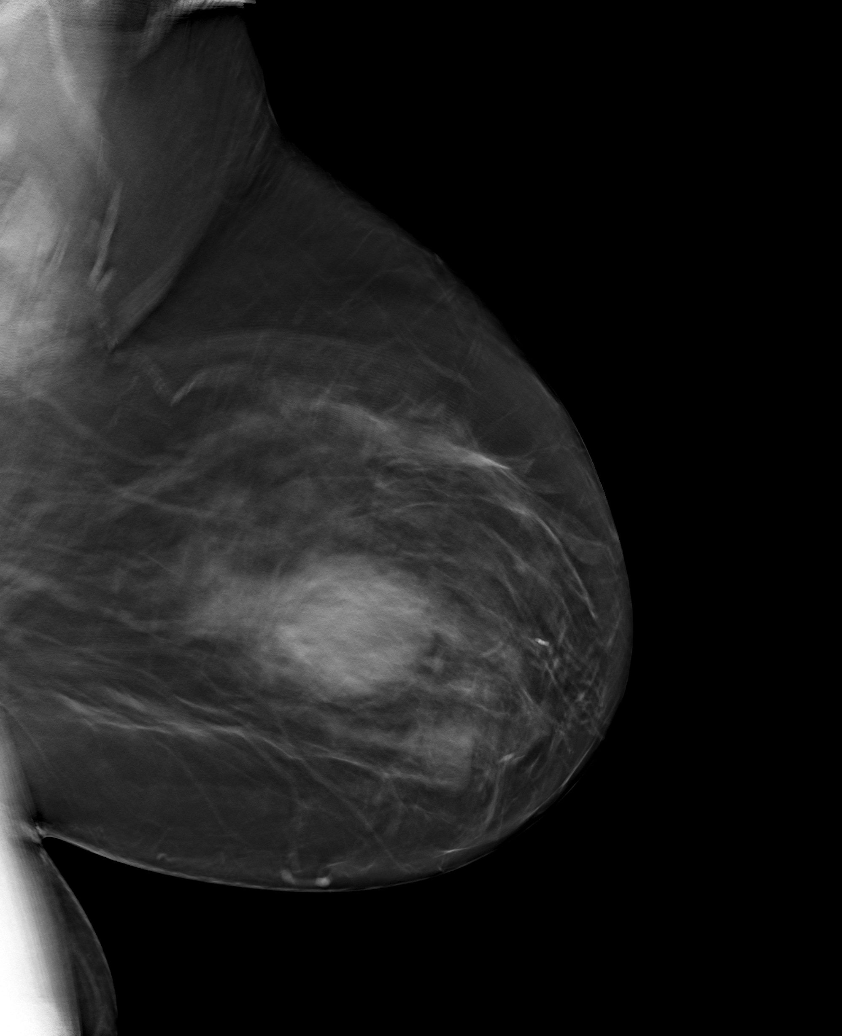

[L CC tomo · tomo slice 41/81.0]
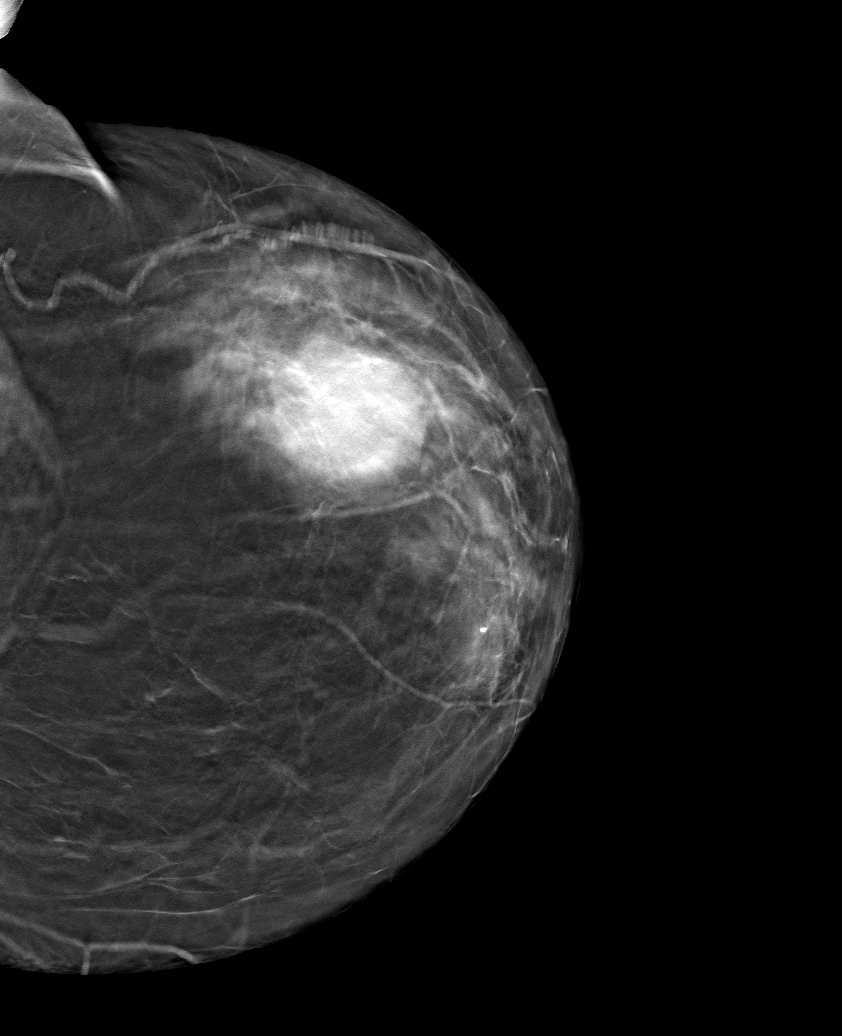

[6 of 18 positions shown; findings below may reference images not displayed]

FINDINGS: Mammographic images were obtained following ultrasound guided biopsy
of a left breast mass and abnormal left axillary node. Both biopsy
marking clips are in good position.
IMPRESSION: The ribbon shaped marker is in the periphery of the biopsied left
breast mass. The HydroMARK marker is located within the 1 of the
left axillary nodes.

Final Assessment: Post Procedure Mammograms for Marker Placement

## 2019-11-13 ENCOUNTER — Encounter: Payer: Self-pay | Admitting: *Deleted

## 2019-11-13 ENCOUNTER — Telehealth: Payer: Self-pay | Admitting: General Practice

## 2019-11-13 NOTE — Telephone Encounter (Signed)
Patient called and requested to speak with pcp or covering provider due to her finding out, from the breast center, that she was diagnosed with breast cancer. Please follow up with patient at your earliest convenience.

## 2019-11-13 NOTE — Telephone Encounter (Signed)
LMOM to ask pt patient for further details

## 2019-11-13 NOTE — Telephone Encounter (Signed)
Patient called and requested for the nurse to return her call when she get a chance. Please follow up at your earliest convenience.

## 2019-11-14 ENCOUNTER — Other Ambulatory Visit: Payer: Self-pay | Admitting: *Deleted

## 2019-11-14 ENCOUNTER — Encounter: Payer: Self-pay | Admitting: Family Medicine

## 2019-11-14 DIAGNOSIS — Z170 Estrogen receptor positive status [ER+]: Secondary | ICD-10-CM | POA: Insufficient documentation

## 2019-11-14 DIAGNOSIS — C50412 Malignant neoplasm of upper-outer quadrant of left female breast: Secondary | ICD-10-CM

## 2019-11-14 NOTE — Telephone Encounter (Signed)
LMOM

## 2019-11-19 ENCOUNTER — Ambulatory Visit: Payer: Medicaid Other | Admitting: Podiatry

## 2019-11-19 NOTE — Telephone Encounter (Signed)
Spoke with patient due to her previous message. Per pt she just wanted to inform PCP that she was dx with breast cancer and on 11-21-19 at the [facility] her f/u appt with them. Per pt she was just calling to update her provider as to what's going on.

## 2019-11-20 NOTE — Progress Notes (Signed)
[place]  Telephone:([phone]) [phone] Fax:(336) [phone]     ID: [name] DOB: Aug 17, 1956  MR#: [id number]  HQR#:[id number]  Patient Care Team: [name], MD as PCP - General (Family Medicine) Mauro Kaufmann, RN as Oncology Nurse Navigator Rockwell Germany, RN as Oncology Nurse Navigator [name], MD as Consulting Physician (General Surgery) Yara Tomkinson, Virgie Dad, MD as Consulting Physician (Oncology) Kyung Rudd, MD as Consulting Physician (Radiation Oncology) Edrick Kins, DPM as Consulting Physician (Podiatry) Chauncey Cruel, MD OTHER MD:  CHIEF COMPLAINT: estrogen receptor positive breast cancer  CURRENT TREATMENT: Awaiting definitive surgery   HISTORY OF CURRENT ILLNESS: [name] (pronounced "yellow") had routine screening mammography on 10/24/2019 showing a possible abnormality in the left breast. She underwent left breast ultrasonography at [facility] on 11/05/2019 showing: suspicious 3.8 cm mass in left breast at 3 o'clock; five abnormal lymph nodes.  Accordingly on 11/12/2019 she proceeded to biopsy of the left breast area in question as well as a lymph node. The pathology from this procedure ([id number]) showed: invasive ductal carcinoma with prominent inflammatory response, grade 3. Prognostic indicators significant for: estrogen receptor, 100% positive and progesterone receptor, 50% positive, both with strong staining intensity. Proliferation marker Ki67 at 70%. HER2 negative by immunohistochemistry (0).  The biopsied lymph node confirmed metastatic breast carcinoma.  The patient's subsequent history is as detailed below.   INTERVAL HISTORY: Tonimarie was evaluated in the multidisciplinary breast cancer clinic on 11/21/2019 accompanied by her sister Sheldon Silvan. Her case was also presented at the multidisciplinary breast cancer conference on the same day. At that time a preliminary plan was proposed: Consider neoadjuvant  chemotherapy but patient will need full axillary lymph node dissection in any case so starting with surgery would be equally possible, at patient's preference; chemotherapy, adjuvant radiation, antiestrogens   REVIEW OF SYSTEMS: On provided questionnaire, she reports weight change, fatigue that affects her activities, muscle aches and cramping, dental problems, palpable breast lump, back pain, and arthritis. There were no specific symptoms leading to the original mammogram, which was routinely scheduled. The patient denies unusual headaches, visual changes, nausea, vomiting, stiff neck, dizziness, or gait imbalance. There has been no cough, phlegm production, or pleurisy, no chest pain or pressure, and no change in bowel or bladder habits. The patient denies fever, rash, or bleeding. A detailed review of systems was otherwise entirely negative.   PAST MEDICAL HISTORY: Past Medical History:  Diagnosis Date  . Arthritis   . Diverticulitis   . Family history of ovarian cancer   . Heart murmur   . Hypertension     PAST SURGICAL HISTORY: Past Surgical History:  Procedure Laterality Date  . RADIAL HEAD ARTHROPLASTY Right 06/25/2019   Procedure: RIGHT RADIAL HEAD ARTHROPLASTY;  Surgeon: [name], MD;  Location: [place];  Service: Orthopedics;  Laterality: Right;  . TUBAL LIGATION      FAMILY HISTORY: Family History  Problem Relation Age of Onset  . Hypertension Mother   . Dementia Mother   . Ovarian cancer Half-Sister 69  . Diabetes Half-Sister   . Stroke Half-Sister   Her father died at age 25; she is unsure of the cause of death. Her mother is age 47 as of 10/2019.  The patient has two brothers and four sisters. She reports ovarian cancer in her sister at age 67. She denies a family history of breast, prostate, or pancreatic cancer.   GYNECOLOGIC HISTORY:  No LMP recorded. Patient is postmenopausal. Menarche: 64 years  old Age at first live birth: 64 years old [place] P 2 LMP around  age 44 Contraceptive: used previously HRT never used  Hysterectomy? no BSO? no   SOCIAL HISTORY: (updated 10/2019)  [name] retired from working at the [employer]. She is divorced. She lives at home alone but during the pandemic her sister Manus Gunning and mother Raford Pitcher live in the same home. Daughter [name], age 97, lives in [place] and is handicapped. [name], age 14, is a Biomedical scientist here in [place]. [place] has two granddaughters. She attends a local [employer].    ADVANCED DIRECTIVES: not in place; at the 11/21/2019 visit the patient was given the appropriate documents to complete and notarized at her discretion.  She expressed spontaneously that she would not want to be resuscitated in case of a terminal event.    HEALTH MAINTENANCE: Social History   Tobacco Use  . Smoking status: Never Smoker  . Smokeless tobacco: Never Used  Substance Use Topics  . Alcohol use: Yes    Alcohol/week: 14.0 standard drinks    Types: 14 Shots of liquor per week    Comment: EOD-weekends  . Drug use: Yes    Types: Marijuana     Colonoscopy: never done  PAP: "1980?"  Bone density: never done   Allergies  Allergen Reactions  . Amlodipine Swelling    BLE edema    Current Outpatient Medications  Medication Sig Dispense Refill  . benazepril (LOTENSIN) 20 MG tablet TAKE 1 TABLET (20 MG TOTAL) BY MOUTH DAILY. TO LOWER BLOOD PRESSURE 90 tablet 1  . Blood Pressure Monitoring ([employer] ADVANCED BP MONITOR) DEVI Measure blood pressure daily and record in diary 1 Device 0  . furosemide (LASIX) 40 MG tablet Take 1 tablet (40 mg total) by mouth daily. 30 tablet 2  . hydrochlorothiazide (HYDRODIURIL) 25 MG tablet Take 1 tablet (25 mg total) by mouth daily. 90 tablet 1  . HYDROcodone-acetaminophen (NORCO) 5-325 MG tablet Take 1-2 tablets by mouth 2 (two) times daily as needed. (Patient not taking: Reported on 09/05/2019) 30 tablet 0  . KLOR-CON M20 20 MEQ tablet TAKE 1 TABLET BY MOUTH EVERY DAY 30 tablet 3  .  meloxicam (MOBIC) 15 MG tablet Take 1 tablet (15 mg total) by mouth daily. 30 tablet 2  . metoprolol tartrate (LOPRESSOR) 50 MG tablet Take 1 tablet (50 mg total) by mouth 2 (two) times daily. 60 tablet 3  . Multiple Vitamins-Minerals (CENTRUM SILVER ADULT 50+) TABS Take 1 tablet by mouth daily.     . Multiple Vitamins-Minerals (HAIR SKIN AND NAILS FORMULA PO) Take 1 tablet by mouth daily at 2 PM.    . ondansetron (ZOFRAN) 4 MG tablet Take 1-2 tablets (4-8 mg total) by mouth every 8 (eight) hours as needed for nausea or vomiting. (Patient not taking: Reported on 09/05/2019) 40 tablet 0  . oxyCODONE-acetaminophen (PERCOCET) 5-325 MG tablet Take 1-2 tablets by mouth 2 (two) times daily as needed for severe pain. (Patient not taking: Reported on 09/05/2019) 30 tablet 0   No current facility-administered medications for this visit.    OBJECTIVE: Morbidly obese African-American woman in no acute distress  Vitals:   11/21/19 0943  BP: (!) 146/89  Pulse: 64  Resp: 18  Temp: 97.6 F (36.4 C)  SpO2: 100%     Body mass index is 60.33 kg/m.   Wt Readings from Last 3 Encounters:  11/21/19 (!) 319 lb 4.8 oz (144.8 kg)  09/05/19 (!) 309 lb (140.2 kg)  06/25/19 (!) 307 lb (139.3 kg)      ECOG FS:2 - Symptomatic, <50% confined to bed  Ocular: Sclerae unicteric, pupils round and equal Ear-nose-throat: Wearing a mask Lymphatic: No cervical or supraclavicular adenopathy Lungs no rales or rhonchi Heart regular rate and rhythm Abd soft, nontender, positive bowel sounds MSK no focal spinal tenderness, no joint edema Neuro: non-focal, well-oriented, appropriate affect Breasts: The right breast is unremarkable.  There is a palpable mass in the left breast superiorly with no skin or nipple involvement.  I do not palpate any mass in the left axilla.     LAB RESULTS:  CMP     Component Value Date/Time   NA 138 11/21/2019 0908   NA 138 10/17/2019 1541   K 4.2 11/21/2019 0908   CL 105 11/21/2019 0908    CO2 22 11/21/2019 0908   GLUCOSE 97 11/21/2019 0908   BUN 18 11/21/2019 0908   BUN 13 10/17/2019 1541   CREATININE 1.59 (H) 11/21/2019 0908   CALCIUM 8.6 (L) 11/21/2019 0908   PROT 8.1 11/21/2019 0908   PROT 8.5 06/15/2019 1622   ALBUMIN 3.1 (L) 11/21/2019 0908   ALBUMIN 4.3 06/15/2019 1622   AST 61 (H) 11/21/2019 0908   ALT 32 11/21/2019 0908   ALKPHOS 68 11/21/2019 0908   BILITOT 0.5 11/21/2019 0908   GFRNONAA 34 (L) 11/21/2019 0908   GFRAA 39 (L) 11/21/2019 0908    No results found for: TOTALPROTELP, ALBUMINELP, A1GS, A2GS, BETS, BETA2SER, GAMS, MSPIKE, SPEI  Lab Results  Component Value Date   WBC 5.5 11/21/2019   NEUTROABS 2.7 11/21/2019   HGB 10.8 (L) 11/21/2019   HCT 33.1 (L) 11/21/2019   MCV 100.9 (H) 11/21/2019   PLT 254 11/21/2019    No results found for: LABCA2  No components found for: TAVWPV948  No results for input(s): INR in the last 168 hours.  No results found for: LABCA2  No results found for: AXK553  No results found for: ZSM270  No results found for: BEM754  No results found for: CA2729  No components found for: HGQUANT  No results found for: CEA1 / No results found for: CEA1   No results found for: AFPTUMOR  No results found for: CHROMOGRNA  No results found for: KPAFRELGTCHN, LAMBDASER, KAPLAMBRATIO (kappa/lambda light chains)  No results found for: HGBA, HGBA2QUANT, HGBFQUANT, HGBSQUAN (Hemoglobinopathy evaluation)   No results found for: LDH  Lab Results  Component Value Date   IRON 56 11/10/2007   (Iron and TIBC)  Lab Results  Component Value Date   FERRITIN 3000 (H) 11/10/2007    Urinalysis    Component Value Date/Time   COLORURINE AMBER (A) 11/03/2018 1627   APPEARANCEUR HAZY (A) 11/03/2018 1627   LABSPEC 1.025 11/03/2018 1627   PHURINE 5.0 11/03/2018 1627   GLUCOSEU NEGATIVE 11/03/2018 1627   HGBUR NEGATIVE 11/03/2018 1627   BILIRUBINUR MODERATE (A) 11/03/2018 1627   KETONESUR 20 (A) 11/03/2018 1627    PROTEINUR 30 (A) 11/03/2018 1627   UROBILINOGEN 1.0 07/14/2012 1630   NITRITE NEGATIVE 11/03/2018 1627   LEUKOCYTESUR NEGATIVE 11/03/2018 1627     STUDIES: US BREAST LTD UNI LEFT INC AXILLA  Result Date: 11/05/2019 CLINICAL DATA:  Patient was called back from screening mammogram for a left breast mass. EXAM: ULTRASOUND OF THE LEFT BREAST COMPARISON:  Previous exam(s). FINDINGS: On physical exam, I palpate a discrete mass in the left breast at 3 o'clock 3 cm from the nipple. Targeted ultrasound is performed, showing a solid  hypoechoic mass with internal blood flow in the left breast at 3 o'clock 3 cm from the nipple measuring 3.7 x 3.4 x 3.8 cm. Sonographic evaluation of the left axilla shows 5 abnormal lymph nodes. The biggest lymph node measures 2.4 cm with no normal fatty hilum. IMPRESSION: Suspicious mass in the 3 o'clock region of the left breast and suspicious left axillary adenopathy. RECOMMENDATION: Ultrasound-guided core biopsy of the mass in the 3 o'clock region of the left breast as well as an enlarged left axillary lymph node is recommended. I have discussed the findings and recommendations with the patient. If applicable, a reminder letter will be sent to the patient regarding the next appointment. BI-RADS CATEGORY  5: Highly suggestive of malignancy. Electronically Signed   By: Lillia Mountain M.D.   On: 11/05/2019 10:19   MM 3D SCREEN BREAST BILATERAL  Result Date: 10/24/2019 CLINICAL DATA:  Screening. EXAM: DIGITAL SCREENING BILATERAL MAMMOGRAM WITH TOMO AND CAD COMPARISON:  Previous exam(s). ACR Breast Density Category b: There are scattered areas of fibroglandular density. FINDINGS: In the left breast, a possible mass warrants further evaluation. In the right breast, no findings suspicious for malignancy. Images were processed with CAD. IMPRESSION: Further evaluation is suggested for possible mass in the left breast. RECOMMENDATION: Diagnostic mammogram and possibly ultrasound of the left  breast. (Code:[id number]) The patient will be contacted regarding the findings, and additional imaging will be scheduled. BI-RADS CATEGORY  0: Incomplete. Need additional imaging evaluation and/or prior mammograms for comparison. Electronically Signed   By: Kristopher Oppenheim M.D.   On: 10/24/2019 15:47   [place] AXILLARY NODE CORE BIOPSY LEFT  Addendum Date: 11/14/2019   ADDENDUM REPORT: 11/13/2019 14:55 ADDENDUM: Pathology revealed GRADE III INVASIVE DUCTAL CARCINOMA WITH PROMINENT INFLAMMATORY RESPONSE of the Left breast, 3 o'clock. Based on histologic features, differential diagnosis can include a medullary carcinoma and lymphoepithelioma-like carcinoma. This was found to be concordant by Dr. Dorise Bullion. Pathology revealed METASTATIC BREAST CARCINOMA TO LYMPH NODE of the Left axilla. This was found to be concordant by Dr. Dorise Bullion. Pathology results were discussed with the patient by telephone. The patient reported doing well after the biopsies with tenderness at the sites. Post biopsy instructions and care were reviewed and questions were answered. The patient was encouraged to call [facility] for any additional concerns. The patient was referred to [facility] on November 21, 2019. Pathology results reported by Terie Purser, RN on 11/13/2019. Electronically Signed   By: Dorise Bullion III M.D   On: 11/13/2019 14:55   Result Date: 11/14/2019 CLINICAL DATA:  Biopsy of a left breast mass and left axillary node. EXAM: ULTRASOUND GUIDED LEFT BREAST CORE NEEDLE BIOPSY COMPARISON:  Previous exam(s). PROCEDURE: I met with the patient and we discussed the procedure of ultrasound-guided biopsy, including benefits and alternatives. We discussed the high likelihood of a successful procedure. We discussed the risks of the procedure, including infection, bleeding, tissue injury, clip migration, and inadequate sampling.  Informed written consent was given. The usual time-out protocol was performed immediately prior to the procedure. Lesion quadrant: 3 o'clock Using sterile technique and 1% Lidocaine as local anesthetic, under direct ultrasound visualization, a 12 gauge spring-loaded device was used to perform biopsy of a 3 o'clock left breast mass using a lateral approach. At the conclusion of the procedure tissue marker clip was deployed into the biopsy cavity. Follow up 2 view mammogram was performed and dictated separately.  Lesion quadrant: Left axilla Using sterile technique and 1% Lidocaine as local anesthetic, under direct ultrasound visualization, a 14 gauge spring-loaded device was used to perform biopsy of an abnormal left axillary node using a lateral approach. At the conclusion of the procedure [facility] tissue marker clip was deployed into the biopsy cavity. Follow up 2 view mammogram was performed and dictated separately. IMPRESSION: Ultrasound guided biopsy of a left breast mass and a left axillary lymph node. No apparent complications. Electronically Signed: By: Dorise Bullion III M.D On: 11/12/2019 13:30   MM CLIP PLACEMENT LEFT  Result Date: 11/12/2019 CLINICAL DATA:  Biopsy of a left breast mass and a left axillary node. Evaluate clip placements. EXAM: DIAGNOSTIC LEFT MAMMOGRAM POST ULTRASOUND BIOPSY COMPARISON:  Previous exam(s). FINDINGS: Mammographic images were obtained following ultrasound guided biopsy of a left breast mass and abnormal left axillary node. Both biopsy marking clips are in good position. IMPRESSION: The ribbon shaped marker is in the periphery of the biopsied left breast mass. The [facility] marker is located within the 1 of the left axillary nodes. Final Assessment: Post Procedure Mammograms for Marker Placement Electronically Signed   By: Dorise Bullion III M.D   On: 11/12/2019 13:47   [place] LT BREAST BX W LOC DEV 1ST LESION IMG BX SPEC US GUIDE  Addendum Date: 11/14/2019   ADDENDUM  REPORT: 11/13/2019 14:55 ADDENDUM: Pathology revealed GRADE III INVASIVE DUCTAL CARCINOMA WITH PROMINENT INFLAMMATORY RESPONSE of the Left breast, 3 o'clock. Based on histologic features, differential diagnosis can include a medullary carcinoma and lymphoepithelioma-like carcinoma. This was found to be concordant by Dr. Dorise Bullion. Pathology revealed METASTATIC BREAST CARCINOMA TO LYMPH NODE of the Left axilla. This was found to be concordant by Dr. Dorise Bullion. Pathology results were discussed with the patient by telephone. The patient reported doing well after the biopsies with tenderness at the sites. Post biopsy instructions and care were reviewed and questions were answered. The patient was encouraged to call [facility] for any additional concerns. The patient was referred to [facility] at [facility] on November 21, 2019. Pathology results reported by Terie Purser, RN on 11/13/2019. Electronically Signed   By: Dorise Bullion III M.D   On: 11/13/2019 14:55   Result Date: 11/14/2019 CLINICAL DATA:  Biopsy of a left breast mass and left axillary node. EXAM: ULTRASOUND GUIDED LEFT BREAST CORE NEEDLE BIOPSY COMPARISON:  Previous exam(s). PROCEDURE: I met with the patient and we discussed the procedure of ultrasound-guided biopsy, including benefits and alternatives. We discussed the high likelihood of a successful procedure. We discussed the risks of the procedure, including infection, bleeding, tissue injury, clip migration, and inadequate sampling. Informed written consent was given. The usual time-out protocol was performed immediately prior to the procedure. Lesion quadrant: 3 o'clock Using sterile technique and 1% Lidocaine as local anesthetic, under direct ultrasound visualization, a 12 gauge spring-loaded device was used to perform biopsy of a 3 o'clock left breast mass using a lateral approach. At the conclusion  of the procedure tissue marker clip was deployed into the biopsy cavity. Follow up 2 view mammogram was performed and dictated separately. Lesion quadrant: Left axilla Using sterile technique and 1% Lidocaine as local anesthetic, under direct ultrasound visualization, a 14 gauge spring-loaded device was used to perform biopsy of an abnormal left axillary node using a lateral approach. At the conclusion of the procedure [facility] tissue marker clip was deployed into the biopsy cavity.  Follow up 2 view mammogram was performed and dictated separately. IMPRESSION: Ultrasound guided biopsy of a left breast mass and a left axillary lymph node. No apparent complications. Electronically Signed: By: Dorise Bullion III M.D On: 11/12/2019 13:30     ELIGIBLE FOR AVAILABLE RESEARCH PROTOCOL: AET  ASSESSMENT: 64 y.o. [place] woman   PLAN: I met today with Gayathri to review her new diagnosis. Specifically we discussed the biology of her breast cancer, its diagnosis, staging, treatment  options and prognosis. We first reviewed the fact that cancer is not one disease but more than 100 different diseases and that it is important to keep them separate-- otherwise when friends and relatives discuss their own cancer experiences with Jameriah confusion can result. Similarly we explained that if breast cancer spreads to the bone or liver, the patient would not have bone cancer or liver cancer, but breast cancer in the bone and breast cancer in the liver: one cancer in three places-- not 3 different cancers which otherwise would have to be treated in 3 different ways.  We discussed the difference between local and systemic therapy. In terms of loco-regional treatment, lumpectomy plus radiation is equivalent to mastectomy as far as survival is concerned. For this reason, and because the cosmetic results are generally superior, we recommend breast conserving surgery.   We also noted that in terms of sequencing of treatments,  whether systemic therapy or surgery is done first does not affect the ultimate outcome.  Unfortunately in her case with 5 clinical lymph nodes on ultrasound we do not have data to spare her axillary lymph node dissection and therefore there is really no advantage to neoadjuvant chemotherapy  We then discussed the rationale for systemic therapy. There is some risk that this cancer may have already spread to other parts of her body. Patients frequently ask at this point about bone scans, CAT scans and PET scans to find out if they have occult breast cancer somewhere else. The problem is that in early stage disease we are much more likely to find false positives then true cancers and this would expose the patient to unnecessary procedures as well as unnecessary radiation. Scans cannot answer the question the patient really would like to know, which is whether she has microscopic disease elsewhere in her body. For those reasons we do not recommend them.  Of course we would proceed to aggressive evaluation of any symptoms that might suggest metastatic disease, but that is not the case here.  Next we went over the options for systemic therapy which are anti-estrogens, anti-HER-2 immunotherapy, and chemotherapy. Roshelle does not meet criteria for anti-HER-2 immunotherapy. She is a good candidate for anti-estrogens.  Because her tumor does not meet criteria for MammaPrint or Oncotype testing she will need adjuvant chemotherapy and this is planned postop.  The plan therefore is to start with surgery, and she will have a port placed at the same time.  She will return to see me for chemotherapy and if her echocardiogram is favorable we will do standard AC followed by T, but if there is any concern we will do TC instead.  We will discussed the side effects toxicities and complications on her return visit.  I have encouraged her to get her healthcare power of attorney completed and notarized.  Oliviah has a good  understanding of the overall plan. She agrees with it. She knows the goal of treatment in her case is cure. She will call with any problems that may develop before  her next visit here.  Total encounter time 65 minutes.Sarajane Jews C. Mukhtar Shams, MD 11/21/2019 3:30 PM Medical Oncology and Hematology [facility] [place], [place] [address] Tel. [phone]    Fax. [phone]   This document serves as a record of services personally performed by Lurline Del, MD. It was created on his behalf by Wilburn Mylar, a trained medical scribe. The creation of this record is based on the scribe's personal observations and the provider's statements to them.   I, Lurline Del MD, have reviewed the above documentation for accuracy and completeness, and I agree with the above.    *Total Encounter Time as defined by the Centers for Medicare and Medicaid Services includes, in addition to the face-to-face time of a patient visit (documented in the note above) non-face-to-face time: obtaining and reviewing outside history, ordering and reviewing medications, tests or procedures, care coordination (communications with other health care professionals or caregivers) and documentation in the medical record.

## 2019-11-21 ENCOUNTER — Other Ambulatory Visit: Payer: Self-pay

## 2019-11-21 ENCOUNTER — Encounter: Payer: Self-pay | Admitting: Physical Therapy

## 2019-11-21 ENCOUNTER — Encounter: Payer: Self-pay | Admitting: Oncology

## 2019-11-21 ENCOUNTER — Ambulatory Visit (HOSPITAL_BASED_OUTPATIENT_CLINIC_OR_DEPARTMENT_OTHER): Payer: Medicaid Other | Admitting: Genetic Counselor

## 2019-11-21 ENCOUNTER — Inpatient Hospital Stay: Payer: Medicaid Other | Attending: Oncology | Admitting: Oncology

## 2019-11-21 ENCOUNTER — Encounter: Payer: Self-pay | Admitting: Genetic Counselor

## 2019-11-21 ENCOUNTER — Inpatient Hospital Stay: Payer: Medicaid Other

## 2019-11-21 ENCOUNTER — Ambulatory Visit
Admission: RE | Admit: 2019-11-21 | Discharge: 2019-11-21 | Disposition: A | Discharge status: Home / Self Care | Payer: Medicaid Other | Source: Ambulatory Visit | Attending: Radiation Oncology | Admitting: Radiation Oncology

## 2019-11-21 ENCOUNTER — Ambulatory Visit: Payer: Medicaid Other | Attending: Surgery | Admitting: Physical Therapy

## 2019-11-21 ENCOUNTER — Other Ambulatory Visit: Payer: Self-pay | Admitting: Surgery

## 2019-11-21 ENCOUNTER — Other Ambulatory Visit: Payer: Self-pay | Admitting: *Deleted

## 2019-11-21 VITALS — BP 146/89 | HR 64 | Temp 97.6°F | Resp 18 | Ht 61.0 in | Wt 319.3 lb

## 2019-11-21 DIAGNOSIS — Z7289 Other problems related to lifestyle: Secondary | ICD-10-CM

## 2019-11-21 DIAGNOSIS — C778 Secondary and unspecified malignant neoplasm of lymph nodes of multiple regions: Secondary | ICD-10-CM

## 2019-11-21 DIAGNOSIS — M791 Myalgia, unspecified site: Secondary | ICD-10-CM | POA: Diagnosis not present

## 2019-11-21 DIAGNOSIS — R5383 Other fatigue: Secondary | ICD-10-CM | POA: Diagnosis not present

## 2019-11-21 DIAGNOSIS — Z170 Estrogen receptor positive status [ER+]: Secondary | ICD-10-CM | POA: Insufficient documentation

## 2019-11-21 DIAGNOSIS — R293 Abnormal posture: Secondary | ICD-10-CM | POA: Diagnosis not present

## 2019-11-21 DIAGNOSIS — Z8041 Family history of malignant neoplasm of ovary: Secondary | ICD-10-CM | POA: Insufficient documentation

## 2019-11-21 DIAGNOSIS — R262 Difficulty in walking, not elsewhere classified: Secondary | ICD-10-CM | POA: Insufficient documentation

## 2019-11-21 DIAGNOSIS — I10 Essential (primary) hypertension: Secondary | ICD-10-CM | POA: Insufficient documentation

## 2019-11-21 DIAGNOSIS — Z818 Family history of other mental and behavioral disorders: Secondary | ICD-10-CM | POA: Insufficient documentation

## 2019-11-21 DIAGNOSIS — C50412 Malignant neoplasm of upper-outer quadrant of left female breast: Secondary | ICD-10-CM

## 2019-11-21 DIAGNOSIS — C50812 Malignant neoplasm of overlapping sites of left female breast: Secondary | ICD-10-CM | POA: Diagnosis not present

## 2019-11-21 DIAGNOSIS — M1990 Unspecified osteoarthritis, unspecified site: Secondary | ICD-10-CM | POA: Insufficient documentation

## 2019-11-21 DIAGNOSIS — Z833 Family history of diabetes mellitus: Secondary | ICD-10-CM | POA: Insufficient documentation

## 2019-11-21 DIAGNOSIS — M549 Dorsalgia, unspecified: Secondary | ICD-10-CM | POA: Diagnosis not present

## 2019-11-21 DIAGNOSIS — R2240 Localized swelling, mass and lump, unspecified lower limb: Secondary | ICD-10-CM | POA: Diagnosis not present

## 2019-11-21 DIAGNOSIS — Z888 Allergy status to other drugs, medicaments and biological substances status: Secondary | ICD-10-CM | POA: Diagnosis not present

## 2019-11-21 DIAGNOSIS — C773 Secondary and unspecified malignant neoplasm of axilla and upper limb lymph nodes: Secondary | ICD-10-CM | POA: Diagnosis not present

## 2019-11-21 DIAGNOSIS — Z79899 Other long term (current) drug therapy: Secondary | ICD-10-CM | POA: Diagnosis not present

## 2019-11-21 DIAGNOSIS — Z823 Family history of stroke: Secondary | ICD-10-CM | POA: Insufficient documentation

## 2019-11-21 DIAGNOSIS — C50912 Malignant neoplasm of unspecified site of left female breast: Secondary | ICD-10-CM | POA: Diagnosis not present

## 2019-11-21 DIAGNOSIS — Z8249 Family history of ischemic heart disease and other diseases of the circulatory system: Secondary | ICD-10-CM | POA: Diagnosis not present

## 2019-11-21 LAB — CMP (CANCER CENTER ONLY)
ALT: 32 U/L (ref 0–44)
AST: 61 U/L — ABNORMAL HIGH (ref 15–41)
Albumin: 3.1 g/dL — ABNORMAL LOW (ref 3.5–5.0)
Alkaline Phosphatase: 68 U/L (ref 38–126)
Anion gap: 11 (ref 5–15)
BUN: 18 mg/dL (ref 8–23)
CO2: 22 mmol/L (ref 22–32)
Calcium: 8.6 mg/dL — ABNORMAL LOW (ref 8.9–10.3)
Chloride: 105 mmol/L (ref 98–111)
Creatinine: 1.59 mg/dL — ABNORMAL HIGH (ref 0.44–1.00)
GFR, Est AFR Am: 39 mL/min — ABNORMAL LOW (ref >60)
GFR, Estimated: 34 mL/min — ABNORMAL LOW (ref >60)
Glucose, Bld: 97 mg/dL (ref 70–99)
Potassium: 4.2 mmol/L (ref 3.5–5.1)
Sodium: 138 mmol/L (ref 135–145)
Total Bilirubin: 0.5 mg/dL (ref 0.3–1.2)
Total Protein: 8.1 g/dL (ref 6.5–8.1)

## 2019-11-21 LAB — CBC WITH DIFFERENTIAL (CANCER CENTER ONLY)
Abs Immature Granulocytes: 0.05 10*3/uL (ref 0.00–0.07)
Basophils Absolute: 0 10*3/uL (ref 0.0–0.1)
Basophils Relative: 1 %
Eosinophils Absolute: 0.2 10*3/uL (ref 0.0–0.5)
Eosinophils Relative: 3 %
HCT: 33.1 % — ABNORMAL LOW (ref 36.0–46.0)
Hemoglobin: 10.8 g/dL — ABNORMAL LOW (ref 12.0–15.0)
Immature Granulocytes: 1 %
Lymphocytes Relative: 36 %
Lymphs Abs: 2 10*3/uL (ref 0.7–4.0)
MCH: 32.9 pg (ref 26.0–34.0)
MCHC: 32.6 g/dL (ref 30.0–36.0)
MCV: 100.9 fL — ABNORMAL HIGH (ref 80.0–100.0)
Monocytes Absolute: 0.6 10*3/uL (ref 0.1–1.0)
Monocytes Relative: 11 %
Neutro Abs: 2.7 10*3/uL (ref 1.7–7.7)
Neutrophils Relative %: 48 %
Platelet Count: 254 10*3/uL (ref 150–400)
RBC: 3.28 MIL/uL — ABNORMAL LOW (ref 3.87–5.11)
RDW: 12.9 % (ref 11.5–15.5)
WBC Count: 5.5 10*3/uL (ref 4.0–10.5)
nRBC: 0 % (ref 0.0–0.2)

## 2019-11-21 LAB — GENETIC SCREENING ORDER

## 2019-11-21 NOTE — Progress Notes (Signed)
Radiation Oncology         [phone] ________________________________  Name: [name]        MRN: [id number]  Date of Service: 11/21/2019 DOB: 1955/09/12  IW:OEHO, Ander Gaster, MD   REFERRING PHYSICIAN: Dr. Ninfa Linden  DIAGNOSIS: The encounter diagnosis was Malignant neoplasm of upper-outer quadrant of left breast in female, estrogen receptor positive ([place]).   HISTORY OF PRESENT ILLNESS: [name] is a 64 y.o. female seen in the multidisciplinary breast clinic for a new diagnosis of left breast cancer. The patient was noted to have a screening detected mass in the left breast.  The abnormality was noted at the 3 o'clock position in the outer left breast measuring 3.8 x 3.7 x 3.4 cm.  She did have 5 abnormal appearing axillary lymph nodes on the left side.  A biopsy on 11/12/2019 revealed a grade 3 invasive ductal carcinoma with inflammatory features, her tumor was ER positive, PR negative, HER-2 negative with a Ki-67 of 70%.  Her sampled lymph node was also positive for metastatic carcinoma.  She is seen today to discuss treatment recommendations for her cancer.    PREVIOUS RADIATION THERAPY: No   PAST MEDICAL HISTORY:  Past Medical History:  Diagnosis Date  . Arthritis   . Diverticulitis   . Family history of ovarian cancer   . Heart murmur   . Hypertension        PAST SURGICAL HISTORY: Past Surgical History:  Procedure Laterality Date  . RADIAL HEAD ARTHROPLASTY Right 06/25/2019   Procedure: RIGHT RADIAL HEAD ARTHROPLASTY;  Surgeon: [name], MD;  Location: [facility];  Service: Orthopedics;  Laterality: Right;  . TUBAL LIGATION       FAMILY HISTORY:  Family History  Problem Relation Age of Onset  . Hypertension Mother   . Dementia Mother   . Ovarian cancer Half-Sister 66  . Diabetes Half-Sister   . Stroke Half-Sister      SOCIAL HISTORY:  reports that she has never smoked. She has never used smokeless tobacco. She reports current alcohol use of about 14.0  standard drinks of alcohol per week. She reports current drug use. Drug: Marijuana. The patient is separated. She lives in [place]. She is retired and used to work at [employer] in [employer].   ALLERGIES: Amlodipine   MEDICATIONS:  Current Outpatient Medications  Medication Sig Dispense Refill  . benazepril (LOTENSIN) 20 MG tablet TAKE 1 TABLET (20 MG TOTAL) BY MOUTH DAILY. TO LOWER BLOOD PRESSURE 90 tablet 1  . Blood Pressure Monitoring ([employer] ADVANCED BP MONITOR) DEVI Measure blood pressure daily and record in diary 1 Device 0  . furosemide (LASIX) 40 MG tablet Take 1 tablet (40 mg total) by mouth daily. 30 tablet 2  . hydrochlorothiazide (HYDRODIURIL) 25 MG tablet Take 1 tablet (25 mg total) by mouth daily. 90 tablet 1  . HYDROcodone-acetaminophen (NORCO) 5-325 MG tablet Take 1-2 tablets by mouth 2 (two) times daily as needed. (Patient not taking: Reported on 09/05/2019) 30 tablet 0  . KLOR-CON M20 20 MEQ tablet TAKE 1 TABLET BY MOUTH EVERY DAY 30 tablet 3  . meloxicam (MOBIC) 15 MG tablet Take 1 tablet (15 mg total) by mouth daily. 30 tablet 2  . metoprolol tartrate (LOPRESSOR) 50 MG tablet Take 1 tablet (50 mg total) by mouth 2 (two) times daily. 60 tablet 3  . Multiple Vitamins-Minerals (CENTRUM SILVER ADULT 50+) TABS Take 1 tablet by mouth daily.     . Multiple Vitamins-Minerals (HAIR SKIN AND NAILS  FORMULA PO) Take 1 tablet by mouth daily at 2 PM.    . ondansetron (ZOFRAN) 4 MG tablet Take 1-2 tablets (4-8 mg total) by mouth every 8 (eight) hours as needed for nausea or vomiting. (Patient not taking: Reported on 09/05/2019) 40 tablet 0  . oxyCODONE-acetaminophen (PERCOCET) 5-325 MG tablet Take 1-2 tablets by mouth 2 (two) times daily as needed for severe pain. (Patient not taking: Reported on 09/05/2019) 30 tablet 0   No current facility-administered medications for this encounter.     REVIEW OF SYSTEMS: On review of systems, the patient reports that she is doing well overall.  Her only  complaint is her lower extremity edema that her PCP is trying to work on. Apparently this was initially felt to be due to her amlodipine, but has not responded to switching her medications. She denies any chest pain, shortness of breath, cough, fevers, chills, night sweats, unintended weight changes. She denies any bowel or bladder disturbances, and denies abdominal pain, nausea or vomiting. She denies any new musculoskeletal or joint aches or pains. A complete review of systems is obtained and is otherwise negative.     PHYSICAL EXAM:  Wt Readings from Last 3 Encounters:  11/21/19 (!) 319 lb 4.8 oz (144.8 kg)  09/05/19 (!) 309 lb (140.2 kg)  06/25/19 (!) 307 lb (139.3 kg)   Temp Readings from Last 3 Encounters:  11/21/19 97.6 F (36.4 C) (Temporal)  06/25/19 (!) 97 F (36.1 C)  06/17/19 98.1 F (36.7 C) (Oral)   BP Readings from Last 3 Encounters:  11/21/19 (!) 146/89  10/17/19 131/85  09/05/19 (!) 163/112   Pulse Readings from Last 3 Encounters:  11/21/19 64  10/17/19 (!) 105  09/05/19 85    In general this is a well appearing African American female in no acute distress. She's alert and oriented x4 and appropriate throughout the examination. Cardiopulmonary assessment is negative for acute distress and she exhibits normal effort. Bilateral breast exam is deferred.    ECOG = 1  0 - Asymptomatic (Fully active, able to carry on all predisease activities without restriction)  1 - Symptomatic but completely ambulatory (Restricted in physically strenuous activity but ambulatory and able to carry out work of a light or sedentary nature. For example, light housework, office work)  2 - Symptomatic, <50% in bed during the day (Ambulatory and capable of all self care but unable to carry out any work activities. Up and about more than 50% of waking hours)  3 - Symptomatic, >50% in bed, but not bedbound (Capable of only limited self-care, confined to bed or chair 50% or more of waking  hours)  4 - Bedbound (Completely disabled. Cannot carry on any self-care. Totally confined to bed or chair)  5 - Death   Eustace Pen MM, Creech RH, Tormey DC, et al. [phone]). "Toxicity and response criteria of the [facility]". [place] Oncol. 5 (6): 649-55    LABORATORY DATA:  Lab Results  Component Value Date   WBC 5.5 11/21/2019   HGB 10.8 (L) 11/21/2019   HCT 33.1 (L) 11/21/2019   MCV 100.9 (H) 11/21/2019   PLT 254 11/21/2019   Lab Results  Component Value Date   NA 138 11/21/2019   K 4.2 11/21/2019   CL 105 11/21/2019   CO2 22 11/21/2019   Lab Results  Component Value Date   ALT 32 11/21/2019   AST 61 (H) 11/21/2019   ALKPHOS 68 11/21/2019   BILITOT 0.5 11/21/2019  RADIOGRAPHY: US BREAST LTD UNI LEFT INC AXILLA  Result Date: 11/05/2019 CLINICAL DATA:  Patient was called back from screening mammogram for a left breast mass. EXAM: ULTRASOUND OF THE LEFT BREAST COMPARISON:  Previous exam(s). FINDINGS: On physical exam, I palpate a discrete mass in the left breast at 3 o'clock 3 cm from the nipple. Targeted ultrasound is performed, showing a solid hypoechoic mass with internal blood flow in the left breast at 3 o'clock 3 cm from the nipple measuring 3.7 x 3.4 x 3.8 cm. Sonographic evaluation of the left axilla shows 5 abnormal lymph nodes. The biggest lymph node measures 2.4 cm with no normal fatty hilum. IMPRESSION: Suspicious mass in the 3 o'clock region of the left breast and suspicious left axillary adenopathy. RECOMMENDATION: Ultrasound-guided core biopsy of the mass in the 3 o'clock region of the left breast as well as an enlarged left axillary lymph node is recommended. I have discussed the findings and recommendations with the patient. If applicable, a reminder letter will be sent to the patient regarding the next appointment. BI-RADS CATEGORY  5: Highly suggestive of malignancy. Electronically Signed   By: Lillia Mountain M.D.   On: 11/05/2019 10:19    MM 3D SCREEN BREAST BILATERAL  Result Date: 10/24/2019 CLINICAL DATA:  Screening. EXAM: DIGITAL SCREENING BILATERAL MAMMOGRAM WITH TOMO AND CAD COMPARISON:  Previous exam(s). ACR Breast Density Category b: There are scattered areas of fibroglandular density. FINDINGS: In the left breast, a possible mass warrants further evaluation. In the right breast, no findings suspicious for malignancy. Images were processed with CAD. IMPRESSION: Further evaluation is suggested for possible mass in the left breast. RECOMMENDATION: Diagnostic mammogram and possibly ultrasound of the left breast. (Code:[id number]-L-[id number]) The patient will be contacted regarding the findings, and additional imaging will be scheduled. BI-RADS CATEGORY  0: Incomplete. Need additional imaging evaluation and/or prior mammograms for comparison. Electronically Signed   By: Kristopher Oppenheim M.D.   On: 10/24/2019 15:47   [place] AXILLARY NODE CORE BIOPSY LEFT  Addendum Date: 11/14/2019   ADDENDUM REPORT: 11/13/2019 14:55 ADDENDUM: Pathology revealed GRADE III INVASIVE DUCTAL CARCINOMA WITH PROMINENT INFLAMMATORY RESPONSE of the Left breast, 3 o'clock. Based on histologic features, differential diagnosis can include a medullary carcinoma and lymphoepithelioma-like carcinoma. This was found to be concordant by Dr. Dorise Bullion. Pathology revealed METASTATIC BREAST CARCINOMA TO LYMPH NODE of the Left axilla. This was found to be concordant by Dr. Dorise Bullion. Pathology results were discussed with the patient by telephone. The patient reported doing well after the biopsies with tenderness at the sites. Post biopsy instructions and care were reviewed and questions were answered. The patient was encouraged to call [facility] for any additional concerns. The patient was referred to [facility] at [address] [facility] on November 21, 2019. Pathology results reported by Terie Purser, RN on  11/13/2019. Electronically Signed   By: Dorise Bullion III M.D   On: 11/13/2019 14:55   Result Date: 11/14/2019 CLINICAL DATA:  Biopsy of a left breast mass and left axillary node. EXAM: ULTRASOUND GUIDED LEFT BREAST CORE NEEDLE BIOPSY COMPARISON:  Previous exam(s). PROCEDURE: I met with the patient and we discussed the procedure of ultrasound-guided biopsy, including benefits and alternatives. We discussed the high likelihood of a successful procedure. We discussed the risks of the procedure, including infection, bleeding, tissue injury, clip migration, and inadequate sampling. Informed written consent was given. The usual time-out protocol was performed immediately prior to  the procedure. Lesion quadrant: 3 o'clock Using sterile technique and 1% Lidocaine as local anesthetic, under direct ultrasound visualization, a 12 gauge spring-loaded device was used to perform biopsy of a 3 o'clock left breast mass using a lateral approach. At the conclusion of the procedure tissue marker clip was deployed into the biopsy cavity. Follow up 2 view mammogram was performed and dictated separately. Lesion quadrant: Left axilla Using sterile technique and 1% Lidocaine as local anesthetic, under direct ultrasound visualization, a 14 gauge spring-loaded device was used to perform biopsy of an abnormal left axillary node using a lateral approach. At the conclusion of the procedure [facility] tissue marker clip was deployed into the biopsy cavity. Follow up 2 view mammogram was performed and dictated separately. IMPRESSION: Ultrasound guided biopsy of a left breast mass and a left axillary lymph node. No apparent complications. Electronically Signed: By: Dorise Bullion III M.D On: 11/12/2019 13:30   MM CLIP PLACEMENT LEFT  Result Date: 11/12/2019 CLINICAL DATA:  Biopsy of a left breast mass and a left axillary node. Evaluate clip placements. EXAM: DIAGNOSTIC LEFT MAMMOGRAM POST ULTRASOUND BIOPSY COMPARISON:  Previous exam(s).  FINDINGS: Mammographic images were obtained following ultrasound guided biopsy of a left breast mass and abnormal left axillary node. Both biopsy marking clips are in good position. IMPRESSION: The ribbon shaped marker is in the periphery of the biopsied left breast mass. The [facility] marker is located within the 1 of the left axillary nodes. Final Assessment: Post Procedure Mammograms for Marker Placement Electronically Signed   By: Dorise Bullion III M.D   On: 11/12/2019 13:47   [place] LT BREAST BX W LOC DEV 1ST LESION IMG BX SPEC US GUIDE  Addendum Date: 11/14/2019   ADDENDUM REPORT: 11/13/2019 14:55 ADDENDUM: Pathology revealed GRADE III INVASIVE DUCTAL CARCINOMA WITH PROMINENT INFLAMMATORY RESPONSE of the Left breast, 3 o'clock. Based on histologic features, differential diagnosis can include a medullary carcinoma and lymphoepithelioma-like carcinoma. This was found to be concordant by Dr. Dorise Bullion. Pathology revealed METASTATIC BREAST CARCINOMA TO LYMPH NODE of the Left axilla. This was found to be concordant by Dr. Dorise Bullion. Pathology results were discussed with the patient by telephone. The patient reported doing well after the biopsies with tenderness at the sites. Post biopsy instructions and care were reviewed and questions were answered. The patient was encouraged to call [facility] for any additional concerns. The patient was referred to [facility] at [facility] on November 21, 2019. Pathology results reported by Terie Purser, RN on 11/13/2019. Electronically Signed   By: Dorise Bullion III M.D   On: 11/13/2019 14:55   Result Date: 11/14/2019 CLINICAL DATA:  Biopsy of a left breast mass and left axillary node. EXAM: ULTRASOUND GUIDED LEFT BREAST CORE NEEDLE BIOPSY COMPARISON:  Previous exam(s). PROCEDURE: I met with the patient and we discussed the procedure of ultrasound-guided biopsy, including  benefits and alternatives. We discussed the high likelihood of a successful procedure. We discussed the risks of the procedure, including infection, bleeding, tissue injury, clip migration, and inadequate sampling. Informed written consent was given. The usual time-out protocol was performed immediately prior to the procedure. Lesion quadrant: 3 o'clock Using sterile technique and 1% Lidocaine as local anesthetic, under direct ultrasound visualization, a 12 gauge spring-loaded device was used to perform biopsy of a 3 o'clock left breast mass using a lateral approach. At the conclusion of the procedure tissue marker clip was deployed into the  biopsy cavity. Follow up 2 view mammogram was performed and dictated separately. Lesion quadrant: Left axilla Using sterile technique and 1% Lidocaine as local anesthetic, under direct ultrasound visualization, a 14 gauge spring-loaded device was used to perform biopsy of an abnormal left axillary node using a lateral approach. At the conclusion of the procedure [facility] tissue marker clip was deployed into the biopsy cavity. Follow up 2 view mammogram was performed and dictated separately. IMPRESSION: Ultrasound guided biopsy of a left breast mass and a left axillary lymph node. No apparent complications. Electronically Signed: By: Dorise Bullion III M.D On: 11/12/2019 13:30       IMPRESSION/PLAN: 1. Stage IIIA, cT2N1M0, grade 3, ER positive invasive ductal carcinoma of the left breast. Dr. Lisbeth Renshaw discusses the pathology findings and reviews the nature of node positive breast disease. The consensus from the breast conference includes proceeding with MRI and anticipate neoajuvant therapy to downsize her disease, and subsequent surgical resection with node dissection versus mastectomy and adjuvant chemotherapy. She is interested in the latter with mastectomy and ALND and adjuvant chemotherapy. Thereafter, she would be a candidate for adjuvant  external radiotherapy to the  breast and regional nodes followed by antiestrogen therapy. We discussed the risks, benefits, short, and long term effects of radiotherapy, and the patient is interested in proceeding. Dr. Lisbeth Renshaw discusses the delivery and logistics of radiotherapy and anticipates a course of 6 1/2 weeks of radiotherapy. We will see her back about 2 weeks after surgery to discuss the simulation process and anticipate we starting radiotherapy about 4-6 weeks after surgery.  2. Lower extremity edema. She will continue to work with her PCP and PT for assessment as well, and we will follow this expectantly.  In a visit lasting 60 minutes, greater than 50% of the time was spent face to face reviewing her case, as well as in preparation of, discussing, and coordinating the patient's care.  The above documentation reflects my direct findings during this shared patient visit. Please see the separate note by Dr. Lisbeth Renshaw on this date for the remainder of the patient's plan of care.    Carola Rhine, PAC

## 2019-11-21 NOTE — Patient Instructions (Signed)

## 2019-11-21 NOTE — Telephone Encounter (Signed)
Patient was contacted on May 24th, 2021 after her initial Oncology appointment

## 2019-11-21 NOTE — Progress Notes (Signed)
REFERRING PROVIDER: Chauncey Cruel, MD [address] [place],  [place] [address]  PRIMARY PROVIDER:  [name], MD  PRIMARY REASON FOR VISIT:  1. Malignant neoplasm of upper-outer quadrant of left breast in female, estrogen receptor positive ([place])   2. Family history of ovarian cancer      I connected with Ms. Seckinger on 11/21/2019 at 12:00 pm EDT by Webex video conference and verified that I am speaking with the correct person using two identifiers.   Patient location: [facility] clinic Provider location: [facility] office  HISTORY OF PRESENT ILLNESS:   Ms. Parlin, a 64 y.o. female, was seen for a [place] cancer genetics consultation at the request of Dr. Jana Hakim due to a personal history of breast cancer and a family history of ovarian cancer.  Ms. Wallick presents to clinic today to discuss the possibility of a hereditary predisposition to cancer, genetic testing, and to further clarify her future cancer risks, as well as potential cancer risks for family members.   In 2021, at the age of 62, Ms. Vonseggern was diagnosed with invasive ductal carcinoma (ER+/PR+/Her2-) of the left breast.   CANCER HISTORY:  Oncology History   No history exists.     RISK FACTORS:  Menarche was at age 23.  First live birth at age 74.  OCP use for approximately 10-12 years.  Ovaries intact: yes.  Hysterectomy: no.  Menopausal status: postmenopausal.  HRT use: 0 years. Colonoscopy: never done. Mammogram within the last year: yes. Number of breast biopsies: 1. Any excessive radiation exposure in the past: no  Past Medical History:  Diagnosis Date  . Arthritis   . Diverticulitis   . Family history of ovarian cancer   . Heart murmur   . Hypertension     Past Surgical History:  Procedure Laterality Date  . RADIAL HEAD ARTHROPLASTY Right 06/25/2019   Procedure: RIGHT RADIAL HEAD ARTHROPLASTY;  Surgeon: [name], MD;  Location: [facility];  Service: Orthopedics;  Laterality: Right;  . TUBAL  LIGATION      Social History   Socioeconomic History  . Marital status: Legally Separated    Spouse name: Not on file  . Number of children: Not on file  . Years of education: Not on file  . Highest education level: Not on file  Occupational History  . Not on file  Tobacco Use  . Smoking status: Never Smoker  . Smokeless tobacco: Never Used  Substance and Sexual Activity  . Alcohol use: Yes    Alcohol/week: 14.0 standard drinks    Types: 14 Shots of liquor per week    Comment: EOD-weekends  . Drug use: Yes    Types: Marijuana  . Sexual activity: Not Currently  Other Topics Concern  . Not on file  Social History Narrative  . Not on file   Social Determinants of Health   Financial Resource Strain:   . Difficulty of Paying Living Expenses:   Food Insecurity:   . Worried About Charity fundraiser in the Last Year:   . Arboriculturist in the Last Year:   Transportation Needs:   . Film/video editor (Medical):   Marland Kitchen Lack of Transportation (Non-Medical):   Physical Activity:   . Days of Exercise per Week:   . Minutes of Exercise per Session:   Stress:   . Feeling of Stress :   Social Connections:   . Frequency of Communication with Friends and Family:   . Frequency of Social  Gatherings with Friends and Family:   . Attends Religious Services:   . Active Member of Clubs or Organizations:   . Attends Archivist Meetings:   Marland Kitchen Marital Status:      FAMILY HISTORY:  We obtained a detailed, 4-generation family history.  Significant diagnoses are listed below: Family History  Problem Relation Age of Onset  . Hypertension Mother   . Dementia Mother   . Ovarian cancer Half-Sister 20  . Diabetes Half-Sister   . Stroke Half-Sister    Ms. Heber has two children - a daughter (age 54) and a son (age 18). She has four sister and two brothers. Two sisters and one brother are maternal half-siblings. One of her half-sisters died from ovarian cancer at the age of 45.  Her other half-sister died at age 61 and had diabetes and multiple strokes.   Ms. Vetter mother is currently living at age 49 and has not had cancer. She has two maternal uncles and one maternal aunt. One of her uncles died in his 39s. None of her maternal aunts or uncles had cancer. Ms. Bonham maternal grandmother died at the age of 82 and her maternal grandfather died in his 79s, and neither had cancer. There are no other known diagnoses of cancer on the maternal side of the family.  Ms. Hogrefe father died at age 69, and he did not have cancer. Ms. Beightol knows that she has paternal uncles, but she is not sure how many and does not have any information about them. She also does not have any information about her paternal grandparents.  Ms. Tarman is unaware of previous family history of genetic testing for hereditary cancer risks. She does not know her ancestry. There is no reported Ashkenazi Jewish ancestry. There is no known consanguinity.  GENETIC COUNSELING ASSESSMENT: Ms. Gibler is a 64 y.o. female with a personal history of breast cancer and family history of ovarian cancer, which is somewhat suggestive of a hereditary cancer syndrome and predisposition to cancer. We, therefore, discussed and recommended the following at today's visit.   DISCUSSION: We discussed that 5-10% of breast cancer is hereditary, with most cases associated with the BRCA1 and BRCA2 genes.  There are other genes that can be associated with hereditary breast cancer syndromes.  These include ATM, CHEK2, PALB2, etc.  We discussed that testing is beneficial for several reasons including knowing about other cancer risks, identifying potential screening and risk-reduction options that may be appropriate, and to understand if other family members could be at risk for cancer and allow them to undergo genetic testing.   We reviewed the characteristics, features and inheritance patterns of hereditary cancer syndromes. We also  discussed genetic testing, including the appropriate family members to test, the process of testing, insurance coverage and turn-around-time for results. We discussed the implications of a negative, positive and/or variant of uncertain significant result. In order to get genetic test results in a timely manner so that Ms. Fabro can use these genetic test results for surgical decisions, we recommended Ms. Brosseau pursue genetic testing for the [facility] Breast Cancer STAT panel. Once complete, we recommend Ms. Stampley pursue reflex genetic testing to the Common Hereditary Cancers panel.   The STAT Breast cancer panel offered by [facility] includes sequencing and rearrangement analysis for the following 9 genes:  ATM, BRCA1, BRCA2, CDH1, CHEK2, PALB2, PTEN, STK11 and TP53.  The Common Hereditary Cancers Panel offered by [facility] includes sequencing and/or deletion duplication testing of the following 48 genes:  APC, ATM, AXIN2, BARD1, BMPR1A, BRCA1, BRCA2, BRIP1, CDH1, CDK4, CDKN2A (p14ARF), CDKN2A (p16INK4a), CHEK2, CTNNA1, DICER1, EPCAM (Deletion/duplication testing only), GREM1 (promoter region deletion/duplication testing only), KIT, MEN1, MLH1, MSH2, MSH3, MSH6, MUTYH, NBN, NF1, NHTL1, PALB2, PDGFRA, PMS2, POLD1, POLE, PTEN, RAD50, RAD51C, RAD51D, RNF43, SDHB, SDHC, SDHD, SMAD4, SMARCA4. STK11, TP53, TSC1, TSC2, and VHL.  The following genes were evaluated for sequence changes only: SDHA and HOXB13 c.251G>A variant only.   Based on Ms. Kurihara's personal and family history of cancer, she meets medical criteria for genetic testing. Despite that she meets criteria, she may still have an out of pocket cost.   PLAN: After considering the risks, benefits, and limitations, Ms. Burek provided informed consent to pursue genetic testing and the blood sample was sent to [facility] for analysis of the Breast Cancer STAT panel + Common Hereditary Cancers panel. Results should be available within approximately one-two  weeks' time, at which point they will be disclosed by telephone to Ms. Piche, as will any additional recommendations warranted by these results. Ms. Mihok will receive a summary of her genetic counseling visit and a copy of her results once available. This information will also be available in Epic.   Ms. Scovell questions were answered to her satisfaction today. Our contact information was provided should additional questions or concerns arise. Thank you for the referral and allowing [place] to share in the care of your patient.   Clint Guy, MS, [facility] Genetic Counselor [email or web address] Phone: [phone]  The patient was seen for a total of 20 minutes in face-to-face genetic counseling.  This patient was discussed with Drs. Magrinat, Lindi Adie and/or Burr Medico who agrees with the above.    _______________________________________________________________________ For Office Staff:  Number of people involved in session: 1 Was an Intern/ student involved with case: no

## 2019-11-21 NOTE — Therapy (Signed)
[place], [place], [address] Phone: [phone]   Fax:  [phone]  Physical Therapy Evaluation  Patient Details  Name: [name] MRN: [id number] Date of Birth: 01/19/1956 Referring Provider (PT): Dr. [name]   Encounter Date: 11/21/2019  PT End of Session - 11/21/19 1545    Visit Number  1    Number of Visits  2    Date for PT Re-Evaluation  01/16/20    PT Start Time  5053    PT Stop Time  1120    PT Time Calculation (min)  32 min    Activity Tolerance  Patient tolerated treatment well    Behavior During Therapy  [facility] for tasks assessed/performed       Past Medical History:  Diagnosis Date  . Arthritis   . Diverticulitis   . Family history of ovarian cancer   . Heart murmur   . Hypertension     Past Surgical History:  Procedure Laterality Date  . RADIAL HEAD ARTHROPLASTY Right 06/25/2019   Procedure: RIGHT RADIAL HEAD ARTHROPLASTY;  Surgeon: [name], MD;  Location: [facility];  Service: Orthopedics;  Laterality: Right;  . TUBAL LIGATION      There were no vitals filed for this visit.   Subjective Assessment - 11/21/19 1535    Subjective  Patient reports she is here today to be seen by her medical team for her newly diagnosed left breast cancer.    Patient is accompained by:  Family member    Pertinent History  Patient was diagnosed on 10/24/2019 with left grade III invasive ductal carcinoma breast cancer. It measures 3.8 cm and is located in the upper outer quadrant. It is ER/PR positive and HER2 negative with a Ki67 of 70%. She has 5 abnormal appearing lymph nodes and 1 biopsied positive node. She has overall poor mobility and she is in a wheelchair today. She had a fall in 05/2019 where she fractured her right ulna and required surgery. She reported that she is on disability due to alcoholism and mental illness.    Patient Stated Goals  Reduce lymphedema risk and learn post  op shoulder ROM HEP    Currently in Pain?  No/denies         [facility] PT Assessment - 11/21/19 0001      Assessment   Medical Diagnosis  Left breast cancer    Referring Provider (PT)  Dr. [name]    Onset Date/Surgical Date  10/24/19    Hand Dominance  Right    Prior Therapy  none - needs PT for balance post op      Precautions   Precautions  Other (comment)    Precaution Comments  High fall risk; active cancer      Restrictions   Weight Bearing Restrictions  No      Balance Screen   Has the patient fallen in the past 6 months  Yes    How many times?  4    Has the patient had a decrease in activity level because of a fear of falling?   Yes    Is the patient reluctant to leave their home because of a fear of falling?   No   Pt agrees she needs PT for balance after breast surgery     Home Environment   Living Environment  Private residence    Living Arrangements  Parent;Other relatives   Mom and sister   Available Help at  Discharge  Family      Prior Function   Level of Independence  Independent with household mobility without device    Vocation  On disability    Leisure  She does not exercise      Cognition   Overall Cognitive Status  Within Functional Limits for tasks assessed      Observation/Other Assessments   Observations  L-Dex not taken as machine unable to obtain a score      ROM / Strength   AROM / PROM / Strength  AROM;Strength      AROM   Overall AROM Comments  Cervical AROM is WNL    AROM Assessment Site  Shoulder    Right/Left Shoulder  Right;Left    Right Shoulder Extension  43 Degrees    Right Shoulder Flexion  146 Degrees    Right Shoulder ABduction  140 Degrees    Right Shoulder Internal Rotation  65 Degrees    Right Shoulder External Rotation  86 Degrees    Left Shoulder Extension  52 Degrees    Left Shoulder Flexion  146 Degrees    Left Shoulder ABduction  142 Degrees    Left Shoulder Internal Rotation  60 Degrees    Left Shoulder  External Rotation  82 Degrees      Strength   Overall Strength  Unable to assess   Due to poor function       LYMPHEDEMA/ONCOLOGY QUESTIONNAIRE - 11/21/19 1542      Type   Cancer Type  Left breast cancer      Lymphedema Assessments   Lymphedema Assessments  Upper extremities      Right Upper Extremity Lymphedema   10 cm Proximal to Olecranon Process  43.5 cm    Olecranon Process  29.6 cm    10 cm Proximal to Ulnar Styloid Process  25.4 cm    Just Proximal to Ulnar Styloid Process  17.4 cm    Across Hand at [employer]  20.2 cm    At [place] of 2nd Digit  6.6 cm      Left Upper Extremity Lymphedema   10 cm Proximal to Olecranon Process  44.3 cm    Olecranon Process  28.5 cm    10 cm Proximal to Ulnar Styloid Process  24 cm    Just Proximal to Ulnar Styloid Process  17.5 cm    Across Hand at [employer]  19.6 cm    At [place] of 2nd Digit  6.7 cm          Quick Dash - 11/21/19 0001    Open a tight or new jar  Moderate difficulty    Do heavy household chores (wash walls, wash floors)  No difficulty    Carry a shopping bag or briefcase  No difficulty    Wash your back  No difficulty    Use a knife to cut food  No difficulty    Recreational activities in which you take some force or impact through your arm, shoulder, or hand (golf, hammering, tennis)  Severe difficulty    During the past week, to what extent has your arm, shoulder or hand problem interfered with your normal social activities with family, friends, neighbors, or groups?  Not at all    During the past week, to what extent has your arm, shoulder or hand problem limited your work or other regular daily activities  Not at all    Arm, shoulder, or hand pain.  None    Tingling (pins and needles) in your arm, shoulder, or hand  None    Difficulty Sleeping  No difficulty    DASH Score  11.36 %        Objective measurements completed on examination: See above findings.         Patient was instructed  today in a home exercise program today for post op shoulder range of motion. These included active assist shoulder flexion in sitting, scapular retraction, wall walking with shoulder abduction, and hands behind head external rotation.  She was encouraged to do these twice a day, holding 3 seconds and repeating 5 times when permitted by her physician.         PT Education - 11/21/19 1543    Education Details  Lymphedema risk reduction and post op shoulder ROM HEP    Person(s) Educated  Patient;Other (comment)   Sister   Methods  Explanation;Demonstration;Handout    Comprehension  Returned demonstration;Verbalized understanding          PT Long Term Goals - 11/21/19 1548      PT LONG TERM GOAL #1   Title  Patient will demonstrate she has regained full shoulder ROM and function post operatively compared to baselines.    Time  8    Period  Weeks    Status  New    Target Date  01/16/20      Breast Clinic Goals - 11/21/19 1548      Patient will be able to verbalize understanding of pertinent lymphedema risk reduction practices relevant to her diagnosis specifically related to skin care.   Time  1    Period  Days    Status  Achieved      Patient will be able to return demonstrate and/or verbalize understanding of the post-op home exercise program related to regaining shoulder range of motion.   Time  1    Period  Days    Status  Achieved      Patient will be able to verbalize understanding of the importance of attending the postoperative After Breast Cancer Class for further lymphedema risk reduction education and therapeutic exercise.   Time  1    Period  Days    Status  Achieved            Plan - 11/21/19 1545    Clinical Impression Statement  Patient was diagnosed on 10/24/2019 with left grade III invasive ductal carcinoma breast cancer. It measures 3.8 cm and is located in the upper outer quadrant. It is ER/PR positive and HER2 negative with a Ki67 of 70%. She has  5 abnormal appearing lymph nodes and 1 biopsied positive node. She has overall poor mobility and she is in a wheelchair today. She had a fall in 05/2019 where she fractured her right ulna and required surgery. She reported that she is on disability due to alcoholism and mental illness. Her multidisciplinary medical team met prior ot her assessments to determine a recommended treatment plan. She is planning to have a left mastectomy and axillary lymph node dissection followed by chemotherapy, radiation, and anti-estrogen therapy. She will benefit from PT to address balance and to reassess shoulder function post operatively.    Stability/Clinical Decision Making  Stable/Uncomplicated    Clinical Decision Making  Low    Rehab Potential  Good    PT Frequency  --   Eval and 1 f/u visit   PT Treatment/Interventions  ADLs/Self Care Home Management;Therapeutic exercise;Patient/family education  PT Next Visit Plan  Will reassess 3-4 weeks post op    PT Home Exercise Plan  Post op shoulder ROM HEP    Consulted and Agree with Plan of Care  Patient;Family member/caregiver    Family Member Consulted  Sister       Patient will benefit from skilled therapeutic intervention in order to improve the following deficits and impairments:  Postural dysfunction, Pain, Impaired UE functional use, Decreased mobility, Decreased range of motion, Decreased knowledge of precautions  Visit Diagnosis: Malignant neoplasm of upper-outer quadrant of left breast in female, estrogen receptor positive ([place]) - Plan: PT plan of care cert/re-cert  Abnormal posture - Plan: PT plan of care cert/re-cert  Difficulty in walking, not elsewhere classified - Plan: PT plan of care cert/re-cert   Patient will follow up at outpatient cancer rehab 3-4 weeks following surgery.  If the patient requires physical therapy at that time, a specific plan will be dictated and sent to the referring physician for approval. The patient was educated  today on appropriate basic range of motion exercises to begin post operatively and the importance of attending the After Breast Cancer class following surgery.  Patient was educated today on lymphedema risk reduction practices as it pertains to recommendations that will benefit the patient immediately following surgery.  She verbalized good understanding.      Problem List Patient Active Problem List   Diagnosis Date Noted  . Morbid obesity with BMI of 60.0-69.9, adult ([place]) 11/21/2019  . Family history of ovarian cancer   . Malignant neoplasm of upper-outer quadrant of left breast in female, estrogen receptor positive ([place]) 11/14/2019  . Fracture of radial head, right, closed 06/25/2019  . Frequent falls 01/10/2019  . Bilateral bunions 01/10/2019  . Toenail fungus 01/10/2019  . Alcoholic hepatitis 97/84/7841  . Edema 11/20/2018  . Alcohol-induced mood disorder ([place]) 11/01/2013  . Alcohol dependence ([place]) 11/01/2013  . Essential hypertension 06/20/2007  . LOW BACK PAIN 06/20/2007  . DIVERTICULOSIS, COLON 05/05/2007   Annia Friendly, PT 11/21/19 3:51 PM  [place], [place], [address] Phone: [phone]   Fax:  [phone]  Name: [name] MRN: [id number] Date of Birth: April 08, 1956

## 2019-11-22 ENCOUNTER — Telehealth: Payer: Self-pay | Admitting: Oncology

## 2019-11-22 NOTE — Telephone Encounter (Signed)
Scheduled appts per 3/24 los. Left voicemail with appt details. Mailed reminder letter and calendar.

## 2019-11-26 ENCOUNTER — Ambulatory Visit (HOSPITAL_COMMUNITY)
Admission: RE | Admit: 2019-11-26 | Discharge: 2019-11-26 | Disposition: A | Discharge status: Home / Self Care | Payer: Medicaid Other | Source: Ambulatory Visit | Attending: Oncology | Admitting: Oncology

## 2019-11-26 ENCOUNTER — Other Ambulatory Visit: Payer: Medicaid Other

## 2019-11-26 ENCOUNTER — Other Ambulatory Visit: Payer: Self-pay

## 2019-11-26 ENCOUNTER — Ambulatory Visit: Payer: Medicaid Other | Admitting: Podiatry

## 2019-11-26 DIAGNOSIS — C50412 Malignant neoplasm of upper-outer quadrant of left female breast: Secondary | ICD-10-CM | POA: Diagnosis not present

## 2019-11-26 DIAGNOSIS — Z170 Estrogen receptor positive status [ER+]: Secondary | ICD-10-CM | POA: Insufficient documentation

## 2019-11-26 DIAGNOSIS — Z01818 Encounter for other preprocedural examination: Secondary | ICD-10-CM | POA: Diagnosis not present

## 2019-11-26 DIAGNOSIS — C50919 Malignant neoplasm of unspecified site of unspecified female breast: Secondary | ICD-10-CM | POA: Insufficient documentation

## 2019-11-26 DIAGNOSIS — I10 Essential (primary) hypertension: Secondary | ICD-10-CM | POA: Insufficient documentation

## 2019-11-26 NOTE — Progress Notes (Signed)
  Echocardiogram 2D Echocardiogram has been performed.  [name] 11/26/2019, 10:08 AM

## 2019-11-27 DIAGNOSIS — K029 Dental caries, unspecified: Secondary | ICD-10-CM | POA: Diagnosis not present

## 2019-11-28 ENCOUNTER — Telehealth: Payer: Self-pay

## 2019-11-28 NOTE — Telephone Encounter (Signed)
Nutrition Assessment  Reason for Assessment:  Pt attended Breast Clinic on 11/21/19 and was given nutrition packet by nurse navigator  ASSESSMENT:  64 year old female with new diagnosis of breast cancer.  Planning surgery, followed by chemotherapy, radiation and antiestrogens.  Past medical history reviewed.  Spoke with patient via phone to introduce self and service at [facility].  Patient reports that she is doing ok, appetite is good.  "All this has been overwhelming but I am trying not to let it get me down."  Medications:  reviewed  Labs: reviewed  Anthropometrics:   Height: 61 inches Weight: 319 lb BMI: 60   NUTRITION DIAGNOSIS: Food and nutrition related knowledge deficit related to new diagnosis of breast cancer as evidenced by no prior need for nutrition related information.  INTERVENTION:   Discussed briefly packet of information regarding nutritional tips for breast cancer patients.  No questions at this time. Contact information provided and patient knows to contact me with questions/concerns.    MONITORING, EVALUATION, and GOAL: Pt will consume a healthy plant based diet to maintain lean body mass throughout treatment.   Dresden Lozito B. Zenia Resides, [place], [place] Registered Dietitian [phone] (pager)

## 2019-11-29 ENCOUNTER — Encounter: Payer: Self-pay | Admitting: *Deleted

## 2019-11-29 DIAGNOSIS — C801 Malignant (primary) neoplasm, unspecified: Secondary | ICD-10-CM

## 2019-11-29 HISTORY — DX: Malignant (primary) neoplasm, unspecified: C80.1

## 2019-11-29 NOTE — Progress Notes (Signed)
Spoke to pt concerning [place] from 3.24.21. Denies questions or concerns regarding dx or treatment care plan. Confirmed future appts. Encourage pt to call with needs. Received verbal understanding.

## 2019-11-30 ENCOUNTER — Encounter: Payer: Self-pay | Admitting: Genetic Counselor

## 2019-11-30 ENCOUNTER — Telehealth: Payer: Self-pay | Admitting: Genetic Counselor

## 2019-11-30 ENCOUNTER — Ambulatory Visit: Payer: Self-pay | Admitting: Genetic Counselor

## 2019-11-30 DIAGNOSIS — Z1379 Encounter for other screening for genetic and chromosomal anomalies: Secondary | ICD-10-CM | POA: Insufficient documentation

## 2019-11-30 DIAGNOSIS — Z01810 Encounter for preprocedural cardiovascular examination: Secondary | ICD-10-CM | POA: Insufficient documentation

## 2019-11-30 DIAGNOSIS — Z1211 Encounter for screening for malignant neoplasm of colon: Secondary | ICD-10-CM | POA: Insufficient documentation

## 2019-11-30 NOTE — Telephone Encounter (Signed)
Revealed negative genetic testing.  Discussed that we do not know why she has breast cancer or why there is cancer in the family.  There could be a genetic mutation contributing toward the cancer in the family that Ms. Theroux did not inherit.  There could also be a mutation in a different gene that we are not testing, or our current technology may not be able detect certain mutations.  It will be a good idea for her to keep in contact with genetics to keep up with whether additional testing may be appropriate in the future.

## 2019-11-30 NOTE — Telephone Encounter (Signed)
LVM that her genetic test results are available and requested that she call back to discuss them.  

## 2019-11-30 NOTE — Progress Notes (Signed)
HPI:  Ms. Ransom was previously seen in the [facility] clinic due to a personal and family history of cancer and concerns regarding a hereditary predisposition to cancer. Please refer to our prior cancer genetics clinic note for more information regarding our discussion, assessment and recommendations, at the time. Ms. Kun recent genetic test results were disclosed to her, as were recommendations warranted by these results. These results and recommendations are discussed in more detail below.  CANCER HISTORY:  Oncology History  Malignant neoplasm of upper-outer quadrant of left breast in female, estrogen receptor positive ([place])  11/14/2019 Initial Diagnosis   Malignant neoplasm of upper-outer quadrant of left breast in female, estrogen receptor positive ([place])   11/29/2019 Genetic Testing   Negative genetic testing:  No pathogenic variants detected on the [facility] Breast Cancer STAT Panel or the Common Hereditary Cancers Panel. The report date is 11/29/2019.  The Breast Cancer STAT Panel offered by [facility] includes sequencing and deletion/duplication analysis for the following 9 genes:  ATM, BRCA1, BRCA2, CDH1, CHEK2, PALB2, PTEN, STK11 and TP53. The Common Hereditary Cancers Panel offered by [facility] includes sequencing and/or deletion duplication testing of the following 48 genes: APC, ATM, AXIN2, BARD1, BMPR1A, BRCA1, BRCA2, BRIP1, CDH1, CDK4, CDKN2A (p14ARF), CDKN2A (p16INK4a), CHEK2, CTNNA1, DICER1, EPCAM (Deletion/duplication testing only), GREM1 (promoter region deletion/duplication testing only), KIT, MEN1, MLH1, MSH2, MSH3, MSH6, MUTYH, NBN, NF1, NHTL1, PALB2, PDGFRA, PMS2, POLD1, POLE, PTEN, RAD50, RAD51C, RAD51D, RNF43, SDHB, SDHC, SDHD, SMAD4, SMARCA4. STK11, TP53, TSC1, TSC2, and VHL.  The following genes were evaluated for sequence changes only: SDHA and HOXB13 c.251G>A variant only.     FAMILY HISTORY:  We obtained a detailed, 4-generation family history.  Significant  diagnoses are listed below: Family History  Problem Relation Age of Onset  . Hypertension Mother   . Dementia Mother   . Ovarian cancer Half-Sister 49  . Diabetes Half-Sister   . Stroke Half-Sister    Ms. Slimp has two children - a daughter (age 48) and a son (age 26). She has four sister and two brothers. Two sisters and one brother are maternal half-siblings. One of her half-sisters died from ovarian cancer at the age of 32. Her other half-sister died at age 96 and had diabetes and multiple strokes.   Ms. Kreuser mother is currently living at age 42 and has not had cancer. She has two maternal uncles and one maternal aunt. One of her uncles died in his 39s. None of her maternal aunts or uncles had cancer. Ms. Chatmon maternal grandmother died at the age of 17 and her maternal grandfather died in his 57s, and neither had cancer. There are no other known diagnoses of cancer on the maternal side of the family.  Ms. Guardado father died at age 49, and he did not have cancer. Ms. Virden knows that she has paternal uncles, but she is not sure how many and does not have any information about them. She also does not have any information about her paternal grandparents.  Ms. Fundora is unaware of previous family history of genetic testing for hereditary cancer risks. She does not know her ancestry. There is no reported Ashkenazi Jewish ancestry. There is no known consanguinity.  GENETIC TEST RESULTS: Genetic testing reported out on 11/29/2019 through the [facility] Breast Cancer STAT Panel and Common Hereditary Cancers Panel. No pathogenic variants were detected.   The Breast Cancer STAT Panel offered by [facility] includes sequencing and deletion/duplication analysis for the following 9 genes:  ATM, BRCA1,  BRCA2, CDH1, CHEK2, PALB2, PTEN, STK11 and TP53. The Common Hereditary Cancers Panel offered by [facility] includes sequencing and/or deletion duplication testing of the following 48 genes: APC, ATM, AXIN2,  BARD1, BMPR1A, BRCA1, BRCA2, BRIP1, CDH1, CDK4, CDKN2A (p14ARF), CDKN2A (p16INK4a), CHEK2, CTNNA1, DICER1, EPCAM (Deletion/duplication testing only), GREM1 (promoter region deletion/duplication testing only), KIT, MEN1, MLH1, MSH2, MSH3, MSH6, MUTYH, NBN, NF1, NHTL1, PALB2, PDGFRA, PMS2, POLD1, POLE, PTEN, RAD50, RAD51C, RAD51D, RNF43, SDHB, SDHC, SDHD, SMAD4, SMARCA4. STK11, TP53, TSC1, TSC2, and VHL.  The following genes were evaluated for sequence changes only: SDHA and HOXB13 c.251G>A variant only. The test report will be scanned into EPIC and located under the Molecular Pathology section of the Results Review tab.  A portion of the result report is included below for reference.     We discussed with Ms. Lisa that because current genetic testing is not perfect, it is possible there may be a gene mutation in one of these genes that current testing cannot detect, but that chance is small.  We also discussed, that there could be another gene that has not yet been discovered, or that we have not yet tested, that is responsible for the cancer diagnoses in the family. It is also possible there is a hereditary cause for the cancer in the family that Ms. Dreese did not inherit and therefore was not identified in her testing.  Therefore, it is important to remain in touch with cancer genetics in the future so that we can continue to offer Ms. Dauenhauer the most up to date genetic testing.   CANCER SCREENING RECOMMENDATIONS: Ms. Tiede test result is considered negative (normal).  This means that we have not identified a hereditary cause for her personal and family history of cancer at this time. Most cancers happen by chance and this negative test suggests that her personal of cancer may fall into this category.    While reassuring, this does not definitively rule out a hereditary predisposition to cancer. It is still possible that there could be genetic mutations that are undetectable by current technology. There  could be genetic mutations in genes that have not been tested or identified to increase cancer risk.  Therefore, it is recommended she continue to follow the cancer management and screening guidelines provided by her oncology and primary healthcare providers.   An individual's cancer risk and medical management are not determined by genetic test results alone. Overall cancer risk assessment incorporates additional factors, including personal medical history, family history, and any available genetic information that may result in a personalized plan for cancer prevention and surveillance.  RECOMMENDATIONS FOR FAMILY MEMBERS:  Individuals in this family might be at some increased risk of developing cancer, over the general population risk, simply due to the family history of cancer.  We recommended women in this family have a yearly mammogram beginning at age 26, or 39 years younger than the earliest onset of cancer, an annual clinical breast exam, and perform monthly breast self-exams. Women in this family should also have a gynecological exam as recommended by their primary provider. All family members should have a colonoscopy by age 30.  It is also possible there is a hereditary cause for the cancer in Ms. Duell's family that she did not inherit and therefore was not identified in her.  Based on Ms. Vernon's family history, we recommended that first-degree relatives of her half-sister, who was diagnosed with ovarian cancer at age 28, have genetic counseling and testing. Ms. Lanpher will let us  know if we can be of any assistance in coordinating genetic counseling and/or testing for this family member.   FOLLOW-UP: Lastly, we discussed with Ms. Henkes that cancer genetics is a rapidly advancing field and it is possible that new genetic tests will be appropriate for her and/or her family members in the future. We encouraged her to remain in contact with cancer genetics on an annual basis so we can update her  personal and family histories and let her know of advances in cancer genetics that may benefit this family.   Our contact number was provided. Ms. Denise questions were answered to her satisfaction, and she knows she is welcome to call us at anytime with additional questions or concerns.   Clint Guy, MS, [facility] Genetic Counselor [email or web address] Phone: [phone]

## 2019-12-07 ENCOUNTER — Other Ambulatory Visit: Payer: Self-pay

## 2019-12-07 ENCOUNTER — Other Ambulatory Visit: Payer: Medicaid Other

## 2019-12-07 ENCOUNTER — Ambulatory Visit
Admission: RE | Admit: 2019-12-07 | Discharge: 2019-12-07 | Disposition: A | Discharge status: Home / Self Care | Payer: Medicaid Other | Source: Ambulatory Visit | Attending: Oncology | Admitting: Oncology

## 2019-12-07 DIAGNOSIS — C50412 Malignant neoplasm of upper-outer quadrant of left female breast: Secondary | ICD-10-CM

## 2019-12-07 IMAGING — MR MR BREAST BILAT WO/W CM
7 of 12 series · 32 of 48 positions shown · IV contrast (gadavist)
Comparison: Previous exams.

CLINICAL DATA: Recent biopsy-proven invasive ductal carcinoma upper
outer quadrant left breast with metastatic left axillary lymph node.

LABS:  None.
EXAM:
BILATERAL BREAST MRI WITH AND WITHOUT CONTRAST
TECHNIQUE: Multiplanar, multisequence MR images of both breasts were obtained
prior to and following the intravenous administration of 10 ml of
Gadavist

[Series 3: t2_tirm_tra ipat (a-p) · axial · 3.0mm · 0.78mm/px · 1 of 55 slices shown]
[im 1/55]
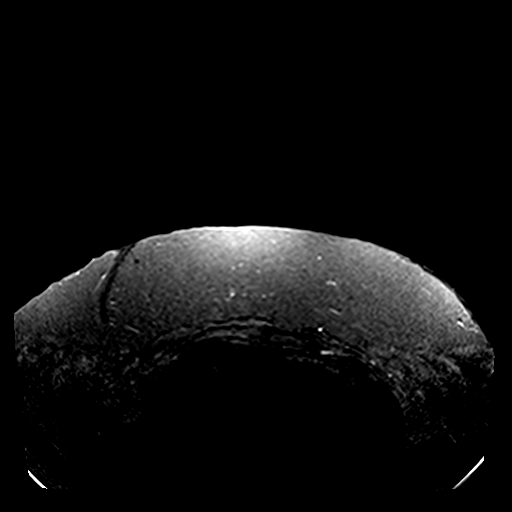

[Series 4: fl3d pre-cm no · axial · non-contrast · 1.2mm · 1.04mm/px · z∈[-28,+144]mm · 5 of 144 slices shown]
[im 1/144]
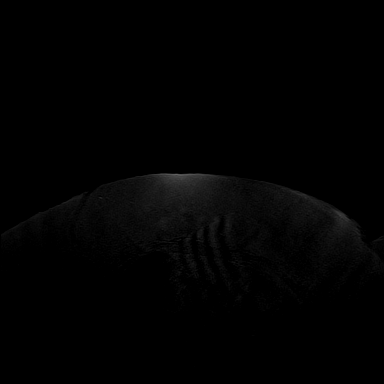
[im 36/144]
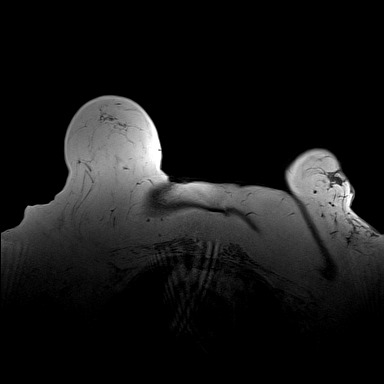
[im 72/144]
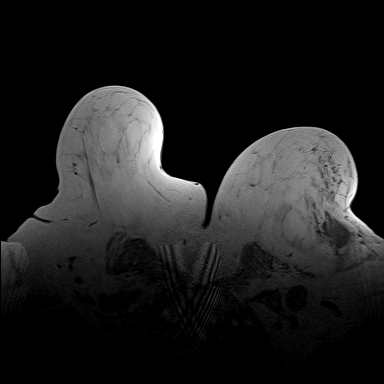
[im 108/144]
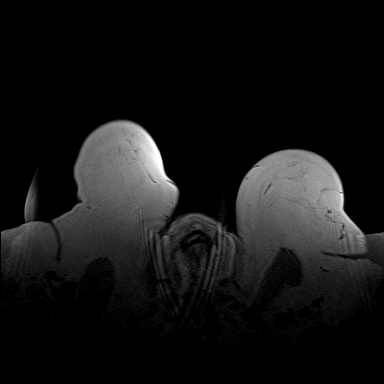
[im 144/144]
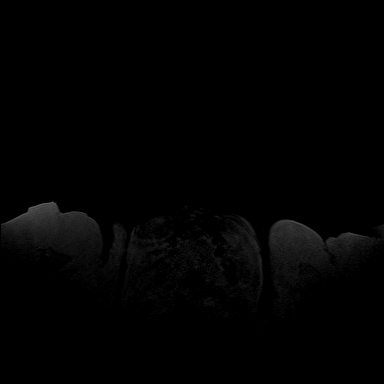

[Series 5: fl3d pre-cm · axial · non-contrast · 1.2mm · 1.04mm/px · z∈[-28,+144]mm · 5 of 144 slices shown]
[im 1/144]
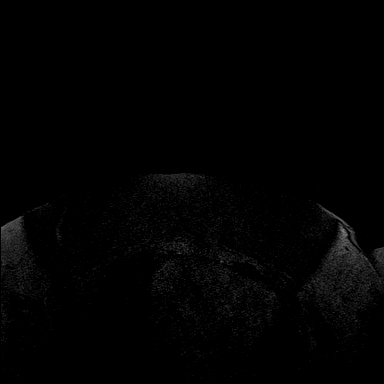
[im 36/144]
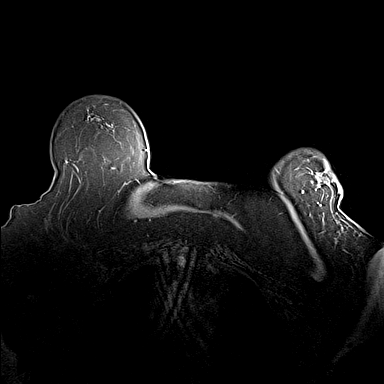
[im 72/144]
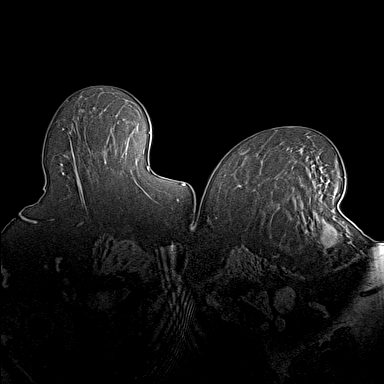
[im 108/144]
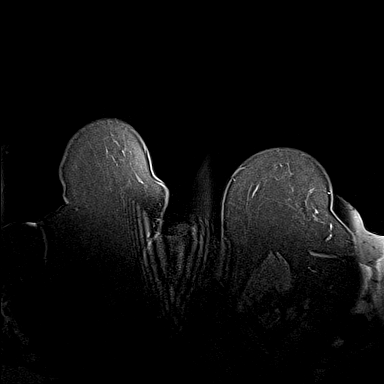
[im 144/144]
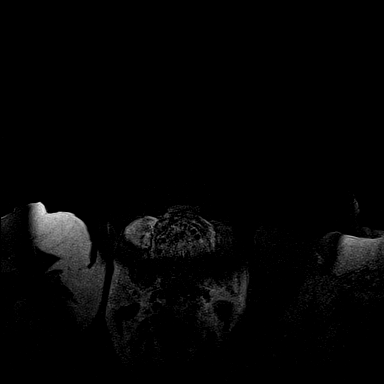

[Series 6: fl3d post-cm 20 · axial · 1.2mm · 1.04mm/px · z∈[-28,+144]mm · 5 of 144 slices shown (1 of 2)]
[im 1/144]
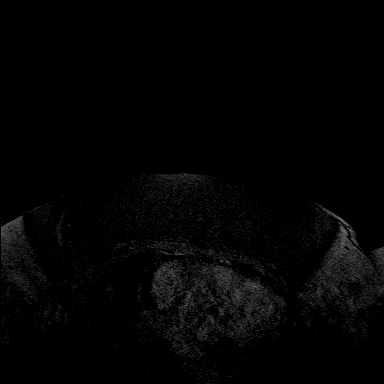
[im 36/144]
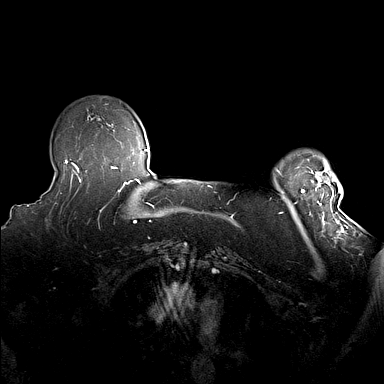
[im 72/144]
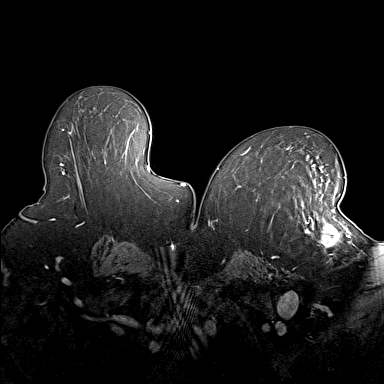
[im 108/144]
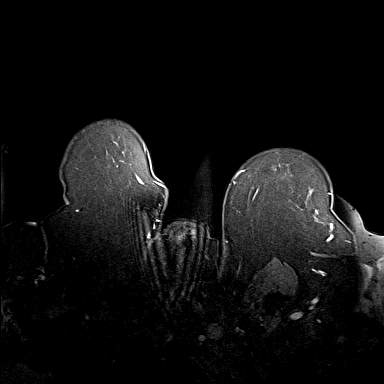
[im 144/144]
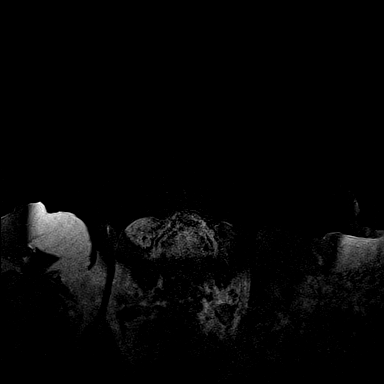

[Series 7: fl3d post-cm 20 · axial · 1.2mm · 1.04mm/px · z∈[-28,+144]mm · 5 of 144 slices shown (2 of 2)]
[im 1/144]
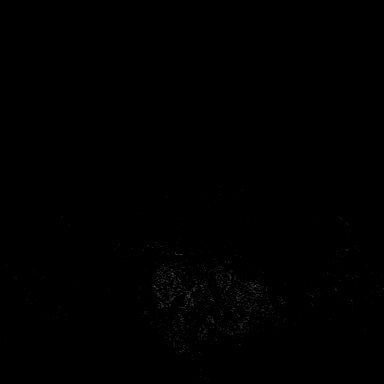
[im 36/144]
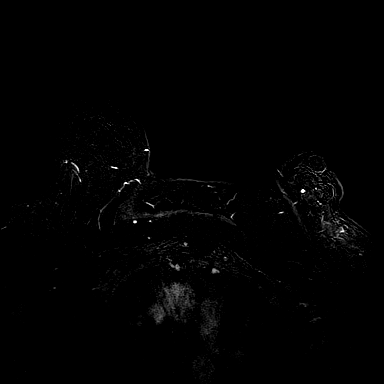
[im 72/144]
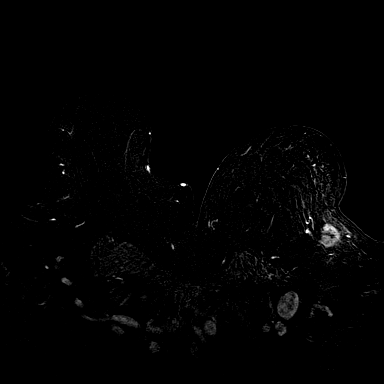
[im 108/144]
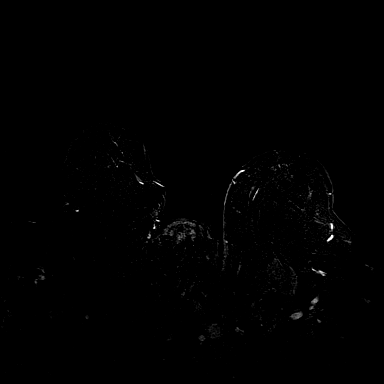
[im 144/144]
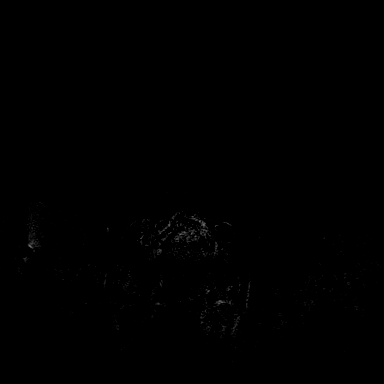

[Series 9: fl3d post-cm 3min · axial · 1.2mm · 1.04mm/px · z∈[-28,+144]mm · 6 of 144 slices shown]
[im 1/144]
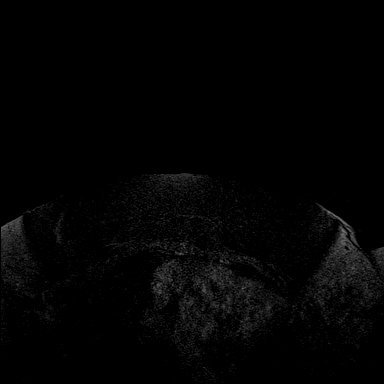
[im 29/144]
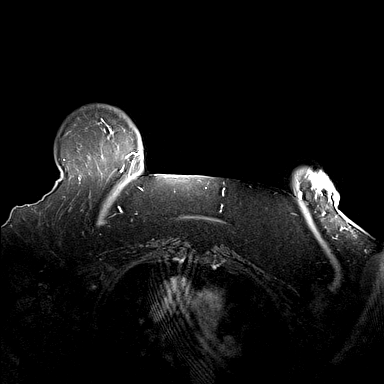
[im 58/144]
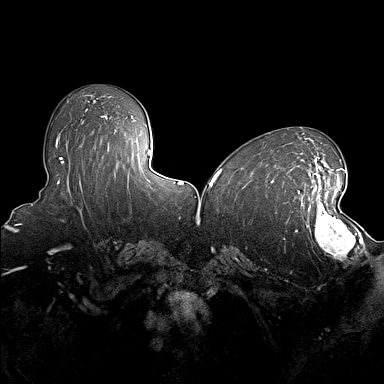
[im 86/144]
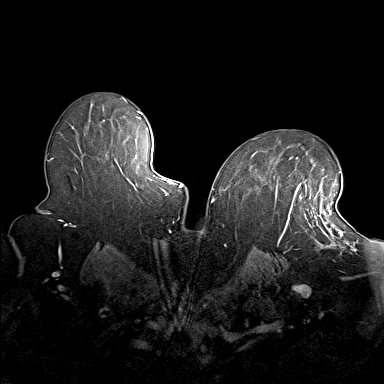
[im 115/144]
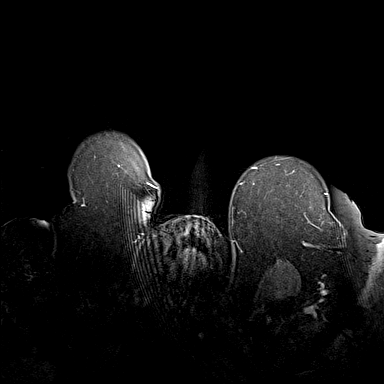
[im 144/144]
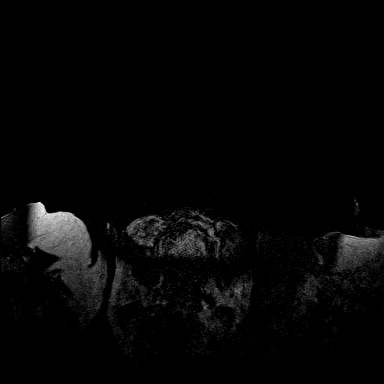

[Series 10: fl3d post-cm 3min_sub · axial · 1.2mm · 1.04mm/px · z∈[-28,+109]mm · 5 of 144 slices shown]
[im 1/144]
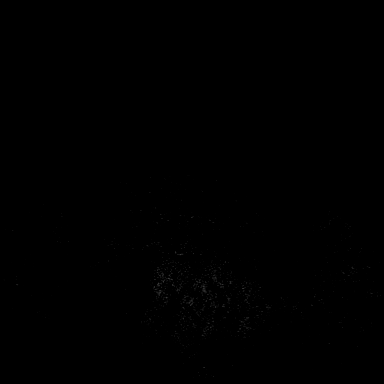
[im 29/144]
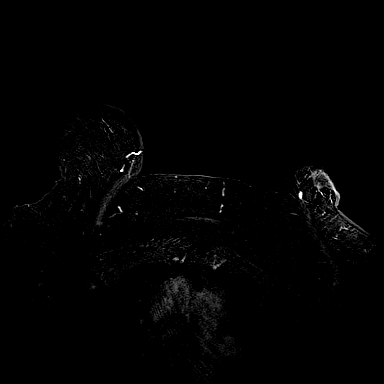
[im 58/144]
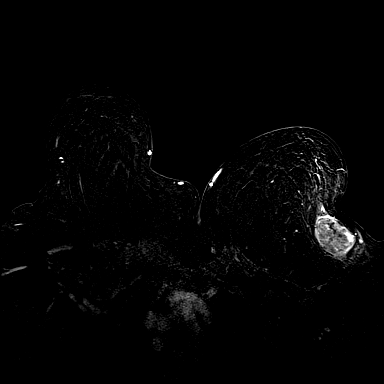
[im 86/144]
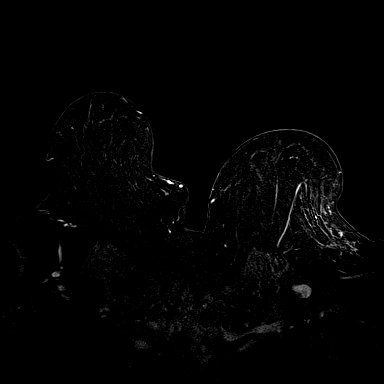
[im 115/144]
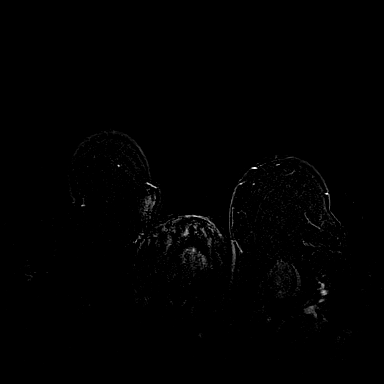

[32 of 48 positions shown; findings below may reference images not displayed]

Three-dimensional MR images were rendered by post-processing of the
original MR data on an independent workstation. The
three-dimensional MR images were interpreted, and findings are
reported in the following complete MRI report for this study. Three
dimensional images were evaluated at the independent [employer]
workstation
FINDINGS: Breast composition: b. Scattered fibroglandular tissue.

Background parenchymal enhancement: Minimal

Right breast: No mass or abnormal enhancement.

Left breast: Examination demonstrates patient's biopsy-proven
malignancy over the posterior third of the upper-outer quadrant with
clip artifact. This mass is fairly superficial and measures
approximately 3.4 x 4.9 x 4.6 cm in transverse, AP and craniocaudal
dimensions. There is deviation of the left nipple laterally and
inferiorly. Remainder of the left breast is unremarkable.

Lymph nodes: Several enlarged left axillary lymph nodes with the
largest most inferior node containing clip artifact compatible with
known metastatic lymph node. This node measures 2.1 x 2.8 cm cm.
There are 4-5 other suspicious left axillary lymph nodes as seen on
ultrasound.

Ancillary findings:  None.
IMPRESSION: 1. 4.9 cm biopsy-proven malignancy over the posterior left upper
outer quadrant. No evidence of multifocal or multicentric disease.

2. Left axillary adenopathy as described compatible with known
metastatic disease.

3.  No suspicious findings within the right breast.

RECOMMENDATION:
Recommend continued management per clinical treatment plan.

BI-RADS CATEGORY  6: Known biopsy-proven malignancy.

## 2019-12-07 MED ORDER — GADOBUTROL 1 MMOL/ML IV SOLN
10.0000 mL | Freq: Once | INTRAVENOUS | Status: AC | PRN
  Administered 2019-12-07: 10 mL via INTRAVENOUS

## 2019-12-08 ENCOUNTER — Ambulatory Visit: Payer: Medicaid Other | Attending: Internal Medicine

## 2019-12-08 DIAGNOSIS — Z23 Encounter for immunization: Secondary | ICD-10-CM

## 2019-12-08 NOTE — Progress Notes (Signed)
   Covid-19 Vaccination Clinic  Name:  [name]    MRN: [id number] DOB: 07-23-1956  12/08/2019  [name] was observed post Covid-19 immunization for 15 minutes without incident. She was provided with Vaccine Information Sheet and instruction to access the V-Safe system.   [name] was instructed to call 911 with any severe reactions post vaccine: Marland Kitchen Difficulty breathing  . Swelling of face and throat  . A fast heartbeat  . A bad rash all over body  . Dizziness and weakness   Immunizations Administered    Name Date Dose VIS Date Route   Moderna COVID-19 Vaccine 12/08/2019 12:49 PM 0.5 mL 07/31/2019 Intramuscular   Manufacturer: Moderna   Lot: [id number]   [place]: [id number]

## 2019-12-10 ENCOUNTER — Encounter: Payer: Self-pay | Admitting: *Deleted

## 2019-12-11 ENCOUNTER — Ambulatory Visit: Payer: Medicaid Other | Admitting: Orthopaedic Surgery

## 2019-12-11 NOTE — Pre-Procedure Instructions (Addendum)
[id number] [address], [place] [address] Phone: [phone] Fax: [phone]      Your procedure is scheduled on Friday April 16th.  Report to [facility] Main Entrance "A" at 7:50 A.M., and check in at the Admitting office.  Call this number if you have problems the morning of surgery:  [phone]  Call [phone] if you have any questions prior to your surgery date Monday-Friday 8am-4pm    Remember:  Do not eat or drink after midnight the night before your surgery    Take these medicines the morning of surgery with A SIP OF WATER   metoprolol tartrate (LOPRESSOR)    As of today, STOP taking any Aspirin (unless otherwise instructed by your surgeon) and Aspirin containing products, Meloxicam (Mobic), Aleve, Naproxen, Ibuprofen, Motrin, Advil, Goody's, BC's, all herbal medications, fish oil, and all vitamins.                      Do not wear jewelry, make up, or nail polish            Do not wear lotions, powders, perfumes/colognes, or deodorant.            Do not shave 48 hours prior to surgery.  Men may shave face and neck.            Do not bring valuables to the hospital.            [facility] is not responsible for any belongings or valuables.  Do NOT Smoke (Tobacco/Vapping) or drink Alcohol 24 hours prior to your procedure If you use a CPAP at night, you may bring all equipment for your overnight stay.   Contacts, glasses, dentures or bridgework may not be worn into surgery.      For patients admitted to the hospital, discharge time will be determined by your treatment team.   Patients discharged the day of surgery will not be allowed to drive home, and someone needs to stay with them for 24 hours.    Special instructions:   [place]- Preparing For Surgery  Before surgery, you can play an important role. Because skin is not sterile, your skin needs to be as free  of germs as possible. You can reduce the number of germs on your skin by washing with CHG (chlorahexidine gluconate) Soap before surgery.  CHG is an antiseptic cleaner which kills germs and bonds with the skin to continue killing germs even after washing.    Oral Hygiene is also important to reduce your risk of infection.  Remember - BRUSH YOUR TEETH THE MORNING OF SURGERY WITH YOUR REGULAR TOOTHPASTE  Please do not use if you have an allergy to CHG or antibacterial soaps. If your skin becomes reddened/irritated stop using the CHG.  Do not shave (including legs and underarms) for at least 48 hours prior to first CHG shower. It is OK to shave your face.  Please follow these instructions carefully.   1. Shower the NIGHT BEFORE SURGERY and the MORNING OF SURGERY with CHG Soap.   2. If you chose to wash your hair, wash your hair first as usual with your normal shampoo.  3. After you shampoo, rinse your hair and body thoroughly to remove the shampoo.  4. Use CHG as you would any other liquid soap. You can apply CHG directly to the skin and wash gently with a scrungie or  a clean washcloth.   5. Apply the CHG Soap to your body ONLY FROM THE NECK DOWN.  Do not use on open wounds or open sores. Avoid contact with your eyes, ears, mouth and genitals (private parts). Wash Face and genitals (private parts)  with your normal soap.   6. Wash thoroughly, paying special attention to the area where your surgery will be performed.  7. Thoroughly rinse your body with warm water from the neck down.  8. DO NOT shower/wash with your normal soap after using and rinsing off the CHG Soap.  9. Pat yourself dry with a CLEAN TOWEL.  10. Wear CLEAN PAJAMAS to bed the night before surgery, wear comfortable clothes the morning of surgery  11. Place CLEAN SHEETS on your bed the night of your first shower and DO NOT SLEEP WITH PETS.   Day of Surgery:   Do not apply any deodorants/lotions.  Please wear clean  clothes to the hospital/surgery center.   Remember to brush your teeth WITH YOUR REGULAR TOOTHPASTE.   Please read over the following fact sheets that you were given.

## 2019-12-11 NOTE — Pre-Procedure Instructions (Addendum)
[id number] [address], [place] [address] Phone: [phone] Fax: [phone]      Your procedure is scheduled on Wednesday April 21st.  Report to [facility] Main Entrance "A" at 10:30 A.M., and check in at the Admitting office.  Call this number if you have problems the morning of surgery:  [phone]  Call [phone] if you have any questions prior to your surgery date Monday-Friday 8am-4pm    Remember:  Do not eat after midnight the night before your surgery  You may drink clear liquids until 9:30am the morning of your surgery.   Clear liquids allowed are: Water, Non-Citrus Juices (without pulp), Carbonated Beverages, Clear Tea, Black Coffee Only, and Gatorade  Please complete your PRE-SURGERY ENSURE that was provided to you by 9:30am the morning of surgery.  Please, if able, drink it in one setting. DO NOT SIP.   Take these medicines the morning of surgery with A SIP OF WATER   metoprolol tartrate (LOPRESSOR)  As of today, STOP taking any Aspirin (unless otherwise instructed by your surgeon) and Aspirin containing products, Meloxicam (Mobic), Aleve, Naproxen, Ibuprofen, Motrin, Advil, Goody's, BC's, all herbal medications, fish oil, and all vitamins.                      Do not wear jewelry, make up, or nail polish            Do not wear lotions, powders, perfumes, or deodorant.            Do not shave 48 hours prior to surgery.              Do not bring valuables to the hospital.            [facility] is not responsible for any belongings or valuables.  Do NOT Smoke (Tobacco/Vapping) or drink Alcohol 24 hours prior to your procedure If you use a CPAP at night, you may bring all equipment for your overnight stay.   Contacts, glasses, dentures or bridgework may not be worn into surgery.      For patients admitted to the hospital, discharge time will be determined by your  treatment team.   Patients discharged the day of surgery will not be allowed to drive home, and someone needs to stay with them for 24 hours.    Special instructions:   [place]- Preparing For Surgery  Before surgery, you can play an important role. Because skin is not sterile, your skin needs to be as free of germs as possible. You can reduce the number of germs on your skin by washing with CHG (chlorahexidine gluconate) Soap before surgery.  CHG is an antiseptic cleaner which kills germs and bonds with the skin to continue killing germs even after washing.    Oral Hygiene is also important to reduce your risk of infection.  Remember - BRUSH YOUR TEETH THE MORNING OF SURGERY WITH YOUR REGULAR TOOTHPASTE  Please do not use if you have an allergy to CHG or antibacterial soaps. If your skin becomes reddened/irritated stop using the CHG.  Do not shave (including legs and underarms) for at least 48 hours prior to first CHG shower. It is OK to shave your face.  Please follow these instructions carefully.   1. Shower the NIGHT BEFORE SURGERY and the MORNING OF SURGERY with CHG Soap.   2. If you chose to wash your  hair, wash your hair first as usual with your normal shampoo.  3. After you shampoo, rinse your hair and body thoroughly to remove the shampoo.  4. Use CHG as you would any other liquid soap. You can apply CHG directly to the skin and wash gently with a scrungie or a clean washcloth.   5. Apply the CHG Soap to your body ONLY FROM THE NECK DOWN.  Do not use on open wounds or open sores. Avoid contact with your eyes, ears, mouth and genitals (private parts). Wash Face and genitals (private parts)  with your normal soap.   6. Wash thoroughly, paying special attention to the area where your surgery will be performed.  7. Thoroughly rinse your body with warm water from the neck down.  8. DO NOT shower/wash with your normal soap after using and rinsing off the CHG Soap.  9. Pat  yourself dry with a CLEAN TOWEL.  10. Wear CLEAN PAJAMAS to bed the night before surgery, wear comfortable clothes the morning of surgery  11. Place CLEAN SHEETS on your bed the night of your first shower and DO NOT SLEEP WITH PETS.   Day of Surgery:   Do not apply any deodorants/lotions.  Please wear clean clothes to the hospital/surgery center.   Remember to brush your teeth WITH YOUR REGULAR TOOTHPASTE.   Please read over the following fact sheets that you were given.

## 2019-12-12 ENCOUNTER — Inpatient Hospital Stay (HOSPITAL_COMMUNITY)
Admission: RE | Admit: 2019-12-12 | Discharge: 2019-12-12 | Disposition: A | Discharge status: Home / Self Care | Payer: Medicaid Other | Source: Ambulatory Visit

## 2019-12-13 ENCOUNTER — Other Ambulatory Visit (HOSPITAL_COMMUNITY)
Admission: RE | Admit: 2019-12-13 | Discharge: 2019-12-13 | Disposition: A | Discharge status: Home / Self Care | Payer: Medicaid Other | Source: Ambulatory Visit | Attending: Oral Surgery | Admitting: Oral Surgery

## 2019-12-13 ENCOUNTER — Telehealth: Payer: Self-pay | Admitting: Family Medicine

## 2019-12-13 ENCOUNTER — Other Ambulatory Visit: Payer: Self-pay

## 2019-12-13 ENCOUNTER — Encounter (HOSPITAL_COMMUNITY): Payer: Self-pay

## 2019-12-13 ENCOUNTER — Encounter (HOSPITAL_COMMUNITY)
Admission: RE | Admit: 2019-12-13 | Discharge: 2019-12-13 | Disposition: A | Discharge status: Home / Self Care | Payer: Medicaid Other | Source: Ambulatory Visit | Attending: Oral Surgery | Admitting: Oral Surgery

## 2019-12-13 ENCOUNTER — Other Ambulatory Visit: Payer: Self-pay | Admitting: Family Medicine

## 2019-12-13 DIAGNOSIS — Z7902 Long term (current) use of antithrombotics/antiplatelets: Secondary | ICD-10-CM | POA: Diagnosis not present

## 2019-12-13 DIAGNOSIS — Z7901 Long term (current) use of anticoagulants: Secondary | ICD-10-CM | POA: Diagnosis not present

## 2019-12-13 DIAGNOSIS — Z79899 Other long term (current) drug therapy: Secondary | ICD-10-CM | POA: Diagnosis not present

## 2019-12-13 DIAGNOSIS — Z20822 Contact with and (suspected) exposure to covid-19: Secondary | ICD-10-CM | POA: Diagnosis not present

## 2019-12-13 DIAGNOSIS — Z01812 Encounter for preprocedural laboratory examination: Secondary | ICD-10-CM | POA: Insufficient documentation

## 2019-12-13 DIAGNOSIS — I10 Essential (primary) hypertension: Secondary | ICD-10-CM | POA: Diagnosis not present

## 2019-12-13 DIAGNOSIS — R7989 Other specified abnormal findings of blood chemistry: Secondary | ICD-10-CM

## 2019-12-13 LAB — BASIC METABOLIC PANEL
Anion gap: 13 (ref 5–15)
BUN: 24 mg/dL — ABNORMAL HIGH (ref 8–23)
CO2: 21 mmol/L — ABNORMAL LOW (ref 22–32)
Calcium: 7.4 mg/dL — ABNORMAL LOW (ref 8.9–10.3)
Chloride: 100 mmol/L (ref 98–111)
Creatinine, Ser: 2.15 mg/dL — ABNORMAL HIGH (ref 0.44–1.00)
GFR calc Af Amer: 27 mL/min — ABNORMAL LOW (ref >60)
GFR calc non Af Amer: 24 mL/min — ABNORMAL LOW (ref >60)
Glucose, Bld: 111 mg/dL — ABNORMAL HIGH (ref 70–99)
Potassium: 4.3 mmol/L (ref 3.5–5.1)
Sodium: 134 mmol/L — ABNORMAL LOW (ref 135–145)

## 2019-12-13 LAB — CBC
HCT: 33.7 % — ABNORMAL LOW (ref 36.0–46.0)
Hemoglobin: 11 g/dL — ABNORMAL LOW (ref 12.0–15.0)
MCH: 32.5 pg (ref 26.0–34.0)
MCHC: 32.6 g/dL (ref 30.0–36.0)
MCV: 99.7 fL (ref 80.0–100.0)
Platelets: 291 10*3/uL (ref 150–400)
RBC: 3.38 MIL/uL — ABNORMAL LOW (ref 3.87–5.11)
RDW: 13.4 % (ref 11.5–15.5)
WBC: 6.1 10*3/uL (ref 4.0–10.5)
nRBC: 0 % (ref 0.0–0.2)

## 2019-12-13 LAB — SARS CORONAVIRUS 2 (TAT 6-24 HRS): SARS Coronavirus 2: NEGATIVE

## 2019-12-13 MED ORDER — DEXTROSE 5 % IV SOLN
3.0000 g | INTRAVENOUS | Status: DC
  Filled 2019-12-13: qty 3000

## 2019-12-13 MED ORDER — DEXTROSE 5 % IV SOLN
3.0000 g | INTRAVENOUS | Status: AC
  Administered 2019-12-14: 3 g via INTRAVENOUS
  Filled 2019-12-13: qty 3

## 2019-12-13 NOTE — Telephone Encounter (Signed)
A rep from [facility] called in to inform pcp that the patients creatinine level is now  2.15 found in the labs that were completed at the patient pre-op appointment today. The patient has an upcoming procedure for her teeth and breast coming up . Please follow up at your earliest convenience.

## 2019-12-13 NOTE — Telephone Encounter (Signed)
Can you see this patient next Monday or Tuesday or if not, see if there is an opening on my schedule.  Patient with elevated creatinine on preop blood work and message was sent to me by anesthesiology.  She is currently on lisinopril and metoprolol.  She also had an elevated BUN.  If you could contact her regarding follow-up next week and to increase hydration in the meantime.  She will likely need to be on medication other than ACE inhibitor due to her elevated creatinine.

## 2019-12-13 NOTE — Progress Notes (Signed)
Anesthesia Chart Review:  Case: [id number] Date/Time: 12/14/19 0935   Procedure: MULTIPLE EXTRACTION WITH ALVEOLOPLASTY (Bilateral )   Anesthesia type: General   Pre-op diagnosis: NONRESTORABLE   Location: [facility] OR ROOM 08 / [place] [facility]   Surgeons: [name] [name] [name], DDS      DISCUSSION: Patient is a 64 year old [name] [name] scheduled for the above procedure. Reportedly, 18 teeth are being removed.  She is also scheduled for left mastectomy by [name] Keens, MD on 12/19/19.  History includes never smoker, HTN, left breast cancer (11/12/19: invasive ductal carcinoma with + left axillary LN), murmur (mild AS, 11/26/19 echo), diverticulitis (2006), fall with right radial head and coronoid fractures (s/p right radial head arthroplasty and closed treatment of displaced coronoid fracture 06/25/19).   No reported CKD, but 10/2019 notes by PCP Dr. Chapman [name] [name] indicate that she was monitoring patient's renal function and had advised good hydration, BP control, and limit/avoid NSAIDS. Creatinine was ~ 1.20-1.30 at that time. Medications include benazepril 20 mg daily, Lasix 40 mg daily, HCTZ [name] mg daily, Mobic 15 mg daily  Lab Results  Component Value Date   CREATININE 2.15 (H) 12/13/2019   CREATININE 1.59 (H) 11/21/2019   CREATININE 1.21 (H) 10/17/2019  Creatinine 1.30 on 06/15/19 and 0.83 on 01/10/19.   I reviewed with anesthesiologist Nunzio Cobbs, DO. Plan for [place] on 12/14/19 prior to surgery to recheck renal function. Would recommend primary care address further prior to undergoing her mastectomy next week. I reached out to Dr. Chapman [name] [name] office. She is on leave, so staff to forward to Dr. Margarita [name] for input. I also notified triage nurse at Dr. [name] office and spoke with patient. Denied any acute GU issues. Continues to drink one 1 alcoholic beverage (liquor) daily, but no recent increase in intake and denied any history of withdrawal. Notified her that plan to recheck labs and awaiting primary care input.     VS: BP  (!) 144/97   Pulse 95   Temp 36.8 C (Oral)   Resp 20   Ht 5\' 1"  (1.549 m)   Wt (!) 144.3 kg   SpO2 98%   BMI 60.10 kg/m    PROVIDERS: [name] Blackbird, MD is PCP [facility] Magrinat, [name] Jews, MD is HEM-ONC   LABS: Preoperative labs noted. See DISCUSSION. LFTs mildly elevated on 11/21/19 with AST 61, ALT 32 (previuosly AST 59, ALT 45 06/15/19).  (all labs ordered are listed, but only abnormal results are displayed)  Labs Reviewed  BASIC METABOLIC PANEL - Abnormal; Notable for the following components:      Result Value   Sodium 134 (*)    CO2 21 (*)    Glucose, Bld 111 (*)    BUN 24 (*)    Creatinine, Ser 2.15 (*)    Calcium 7.4 (*)    GFR calc non Af Amer 24 (*)    GFR calc Af Amer 27 (*)    All other components within normal limits  CBC - Abnormal; Notable for the following components:   RBC 3.38 (*)    Hemoglobin 11.0 (*)    HCT 33.7 (*)    All other components within normal limits  SARS CORONAVIRUS 2 (TAT 6-24 HRS)     EKG: 06/25/19: Normal sinus rhythm Possible Left atrial enlargement Low voltage QRS Septal infarct , age undetermined Abnormal ECG No significant change since last tracing Confirmed by Charolette Forward ([id number]) on 06/25/2019 5:04:08 PM   CV: Echo 11/26/19: IMPRESSIONS  1. Left ventricular ejection fraction,  by estimation, is 65 to 70%. The  left ventricle has normal function. The left ventricle has no regional  wall motion abnormalities. There is mild left ventricular hypertrophy.  Left ventricular diastolic parameters  are consistent with Grade II diastolic dysfunction (pseudonormalization).  Elevated left ventricular end-diastolic pressure. The average left  ventricular global longitudinal strain is -24.0 %.  2. Right ventricular systolic function is normal. The right ventricular  size is normal.  3. The aortic valve is tricuspid. Aortic valve regurgitation is trivial.  Mild aortic valve stenosis. Aortic  valve mean gradient measures 12.0 mmHg,  however, the LVOT gradient was 8 mmHg and there is an intracavitary  gradient - significant aortic  stenosis is not suspected.  4. The mitral valve is abnormal. Trivial mitral valve regurgitation.    Past Medical History:  Diagnosis Date  . Arthritis   . Cancer ([place]) 11/2019   Left breast  . Diverticulitis   . Family history of ovarian cancer   . Heart murmur   . Hypertension     Past Surgical History:  Procedure Laterality Date  . RADIAL HEAD ARTHROPLASTY Right 06/25/2019   Procedure: RIGHT RADIAL HEAD ARTHROPLASTY;  Surgeon: [name], MD;  Location: [place];  Service: Orthopedics;  Laterality: Right;  . TUBAL LIGATION      MEDICATIONS: . furosemide (LASIX) 40 MG tablet  . benazepril (LOTENSIN) 20 MG tablet  . Blood Pressure Monitoring ([employer] ADVANCED BP MONITOR) DEVI  . hydrochlorothiazide (HYDRODIURIL) [name] MG tablet  . HYDROcodone-acetaminophen (NORCO) 5-325 MG tablet  . KLOR-CON M20 20 MEQ tablet  . meloxicam (MOBIC) 15 MG tablet  . metoprolol tartrate (LOPRESSOR) 50 MG tablet  . Multiple Vitamins-Minerals (CENTRUM SILVER ADULT 50+) TABS  . Multiple Vitamins-Minerals (HAIR SKIN AND NAILS FORMULA PO)  . ondansetron (ZOFRAN) 4 MG tablet  . oxyCODONE-acetaminophen (PERCOCET) 5-325 MG tablet   No current facility-administered medications for this encounter.   [name] ON 12/14/2019] ceFAZolin (ANCEF) 3 g in dextrose 5 % 50 mL IVPB  She is not currently taking Percocet, Zofran, Norco.    [name] Gianotti, PA-C Surgical Short Stay/Anesthesiology [facility] Phone [phone] [facility] Phone [phone] 12/13/2019 5:05 PM

## 2019-12-13 NOTE — H&P (Signed)
Patient: [name]  PID: [id number]  DOB: 1956/05/07  SEX: Female   Patient referred by Maia Breslow, DDS.  CC: painful teeth  Past Medical History:  Morbid Obesity, Arthritis, High Blood Pressure, Diverticulitis    Medications: Benazepril, HCTZ, Vitamins    Allergies:     NKDA    Surgeries:   Tubal ligation, Arm surgery         Social History       Smoking:   n         Alcohol:n Drug use:   n                           Exam: BMI  60. Bone loss all teeth. Erythematous gingiva. Plaque and calculus present.  No purulence, edema, fluctuance, trismus. Oral cancer screening negative. Pharynx clear. No lymphadenopathy.  Panorex: U/L bone loss. Teeth # 1, 6, 7, 8, 10, 11, 16, 21, 22, 23, 24, 25, 26, 27, 28, 29, 32 present.Radio-opaque area between 29 and 32.  Assessment:  Non-restorable Teeth # 1, 6, 7, 8, 10, 11, 16, 21, 22, 23, 24, 25, 26, 27, 28, 29, 32. Possible osteoma vs sclerotic bone right mandible.               Plan: Extraction Teeth #  1, 6, 7, 8, 10, 11, 16, 21, 22, 23, 24, 25, 26, 27, 28, 29, 32. Alveo. Possible bone biopsy or tumor removal right mandible.   Hospital Day surgery.                 Rx: n              Risks and complications explained. Questions answered.   Gae Bon, DMD

## 2019-12-13 NOTE — Progress Notes (Unsigned)
Patient ID: [name], female   DOB: 03-01-1956, 64 y.o.   MRN: [id number]   Patient had preop procedure and was found to have elevated creatinine at 2.15.  Patient will be contacted to come in to meet with clinical pharmacist regarding her blood pressure and possible need for change in medications and will also have repeat BMP.  She will be contacted regarding increasing fluids to see if this helps with her creatinine.

## 2019-12-13 NOTE — Progress Notes (Addendum)
PCP - Dr. [name] Cardiologist - Denies  PPM/ICD - Denies  EKG - 06/25/19 Stress Test - Denies ECHO - 11/26/19 Cardiac Cath - Denies  Sleep Study - Denies  DM - Denies  Blood Thinner Instructions: Instructed to stop Mobic for upcoming surgeries  ERAS Protcol - Yes for Dr. [name] surgery PRE-SURGERY Ensure given  COVID TEST- On 15th and again on the 19th  Anesthesia review: Yes elevated creatinine  Patient denies fever, cough and chest pain at PAT appointment Patient does get SOB on exertion this is her baseline.  All instructions explained to the patient, with a verbal understanding of the material. Patient agrees to go over the instructions while at home for a better understanding. Patient also instructed to self quarantine after being tested for COVID-19. The opportunity to ask questions was provided.

## 2019-12-14 ENCOUNTER — Ambulatory Visit (HOSPITAL_COMMUNITY): Payer: Medicaid Other | Admitting: Vascular Surgery

## 2019-12-14 ENCOUNTER — Encounter (HOSPITAL_COMMUNITY): Payer: Self-pay | Admitting: Oral Surgery

## 2019-12-14 ENCOUNTER — Other Ambulatory Visit: Payer: Self-pay

## 2019-12-14 ENCOUNTER — Encounter (HOSPITAL_COMMUNITY)
Admission: RE | Disposition: A | Discharge status: Home / Self Care | Payer: Self-pay | Source: Home / Self Care | Attending: Oral Surgery

## 2019-12-14 ENCOUNTER — Ambulatory Visit (HOSPITAL_COMMUNITY): Payer: Medicaid Other | Admitting: Certified Registered"

## 2019-12-14 ENCOUNTER — Ambulatory Visit (HOSPITAL_COMMUNITY)
Admission: RE | Admit: 2019-12-14 | Discharge: 2019-12-14 | Disposition: A | Discharge status: Home / Self Care | Payer: Medicaid Other | Attending: Oral Surgery | Admitting: Oral Surgery

## 2019-12-14 ENCOUNTER — Telehealth: Payer: Self-pay | Admitting: Pharmacist

## 2019-12-14 DIAGNOSIS — K029 Dental caries, unspecified: Secondary | ICD-10-CM | POA: Diagnosis not present

## 2019-12-14 DIAGNOSIS — Z6841 Body Mass Index (BMI) 40.0 and over, adult: Secondary | ICD-10-CM | POA: Insufficient documentation

## 2019-12-14 DIAGNOSIS — K083 Retained dental root: Secondary | ICD-10-CM | POA: Diagnosis not present

## 2019-12-14 DIAGNOSIS — M1990 Unspecified osteoarthritis, unspecified site: Secondary | ICD-10-CM | POA: Insufficient documentation

## 2019-12-14 DIAGNOSIS — Z79899 Other long term (current) drug therapy: Secondary | ICD-10-CM | POA: Diagnosis not present

## 2019-12-14 DIAGNOSIS — D165 Benign neoplasm of lower jaw bone: Secondary | ICD-10-CM | POA: Diagnosis not present

## 2019-12-14 DIAGNOSIS — I10 Essential (primary) hypertension: Secondary | ICD-10-CM | POA: Insufficient documentation

## 2019-12-14 DIAGNOSIS — K056 Periodontal disease, unspecified: Secondary | ICD-10-CM | POA: Diagnosis not present

## 2019-12-14 HISTORY — PX: MULTIPLE EXTRACTIONS WITH ALVEOLOPLASTY: SHX5342

## 2019-12-14 LAB — POCT I-STAT, CHEM 8
BUN: 25 mg/dL — ABNORMAL HIGH (ref 8–23)
Calcium, Ion: 0.91 mmol/L — ABNORMAL LOW (ref 1.15–1.40)
Chloride: 100 mmol/L (ref 98–111)
Creatinine, Ser: 1.9 mg/dL — ABNORMAL HIGH (ref 0.44–1.00)
Glucose, Bld: 129 mg/dL — ABNORMAL HIGH (ref 70–99)
HCT: 37 % (ref 36.0–46.0)
Hemoglobin: 12.6 g/dL (ref 12.0–15.0)
Potassium: 4.3 mmol/L (ref 3.5–5.1)
Sodium: 136 mmol/L (ref 135–145)
TCO2: 24 mmol/L (ref 22–32)

## 2019-12-14 SURGERY — MULTIPLE EXTRACTION WITH ALVEOLOPLASTY
Anesthesia: General | Laterality: Bilateral

## 2019-12-14 MED ORDER — OXYMETAZOLINE HCL 0.05 % NA SOLN
NASAL | Status: AC
  Filled 2019-12-14: qty 30

## 2019-12-14 MED ORDER — ADENOSINE 6 MG/2ML IV SOLN
INTRAVENOUS | Status: DC | PRN
  Administered 2019-12-14 (×2): 6 mg via INTRAVENOUS

## 2019-12-14 MED ORDER — METOPROLOL TARTRATE 5 MG/5ML IV SOLN
INTRAVENOUS | Status: DC | PRN
  Administered 2019-12-14: 1 mg via INTRAVENOUS
  Administered 2019-12-14 (×2): 2 mg via INTRAVENOUS

## 2019-12-14 MED ORDER — SODIUM CHLORIDE 0.9 % IV SOLN
INTRAVENOUS | Status: AC | PRN
  Administered 2019-12-14: 1000 mL

## 2019-12-14 MED ORDER — LIDOCAINE 2% (20 MG/ML) 5 ML SYRINGE
INTRAMUSCULAR | Status: DC | PRN
  Administered 2019-12-14: 40 mg via INTRAVENOUS

## 2019-12-14 MED ORDER — 0.9 % SODIUM CHLORIDE (POUR BTL) OPTIME
TOPICAL | Status: DC | PRN
  Administered 2019-12-14: 1000 mL

## 2019-12-14 MED ORDER — FENTANYL CITRATE (PF) 100 MCG/2ML IJ SOLN
INTRAMUSCULAR | Status: DC | PRN
  Administered 2019-12-14: 50 ug via INTRAVENOUS
  Administered 2019-12-14: 100 ug via INTRAVENOUS

## 2019-12-14 MED ORDER — DEXAMETHASONE SODIUM PHOSPHATE 10 MG/ML IJ SOLN
INTRAMUSCULAR | Status: DC | PRN
  Administered 2019-12-14: 8 mg via INTRAVENOUS

## 2019-12-14 MED ORDER — ROCURONIUM BROMIDE 10 MG/ML (PF) SYRINGE
PREFILLED_SYRINGE | INTRAVENOUS | Status: AC
  Filled 2019-12-14: qty 10

## 2019-12-14 MED ORDER — DEXAMETHASONE SODIUM PHOSPHATE 10 MG/ML IJ SOLN
INTRAMUSCULAR | Status: AC
  Filled 2019-12-14: qty 1

## 2019-12-14 MED ORDER — MIDAZOLAM HCL 2 MG/2ML IJ SOLN
INTRAMUSCULAR | Status: DC | PRN
  Administered 2019-12-14: 2 mg via INTRAVENOUS

## 2019-12-14 MED ORDER — SODIUM CHLORIDE 0.9 % IV SOLN: INTRAVENOUS | Status: DC | PRN

## 2019-12-14 MED ORDER — LIDOCAINE-EPINEPHRINE 2 %-1:100000 IJ SOLN
INTRAMUSCULAR | Status: AC
  Filled 2019-12-14: qty 1

## 2019-12-14 MED ORDER — ROCURONIUM BROMIDE 10 MG/ML (PF) SYRINGE
PREFILLED_SYRINGE | INTRAVENOUS | Status: DC | PRN
  Administered 2019-12-14: 60 mg via INTRAVENOUS

## 2019-12-14 MED ORDER — SUGAMMADEX SODIUM 200 MG/2ML IV SOLN
INTRAVENOUS | Status: DC | PRN
  Administered 2019-12-14: 300 mg via INTRAVENOUS

## 2019-12-14 MED ORDER — ESMOLOL HCL 100 MG/10ML IV SOLN
INTRAVENOUS | Status: DC | PRN
  Administered 2019-12-14 (×2): 20 mg via INTRAVENOUS

## 2019-12-14 MED ORDER — LIDOCAINE-EPINEPHRINE 2 %-1:100000 IJ SOLN
INTRAMUSCULAR | Status: DC | PRN
  Administered 2019-12-14: 18 mL via INTRADERMAL

## 2019-12-14 MED ORDER — LIDOCAINE 2% (20 MG/ML) 5 ML SYRINGE
INTRAMUSCULAR | Status: AC
  Filled 2019-12-14: qty 5

## 2019-12-14 MED ORDER — ALBUTEROL SULFATE HFA 108 (90 BASE) MCG/ACT IN AERS
INHALATION_SPRAY | RESPIRATORY_TRACT | Status: DC | PRN
  Administered 2019-12-14: 2 via RESPIRATORY_TRACT

## 2019-12-14 MED ORDER — PROPOFOL 10 MG/ML IV BOLUS
INTRAVENOUS | Status: AC
  Filled 2019-12-14: qty 20

## 2019-12-14 MED ORDER — OXYMETAZOLINE HCL 0.05 % NA SOLN
NASAL | Status: DC | PRN
  Administered 2019-12-14: 1 via TOPICAL

## 2019-12-14 MED ORDER — ESMOLOL HCL 100 MG/10ML IV SOLN
INTRAVENOUS | Status: AC
  Filled 2019-12-14: qty 10

## 2019-12-14 MED ORDER — MIDAZOLAM HCL 2 MG/2ML IJ SOLN
INTRAMUSCULAR | Status: AC
  Filled 2019-12-14: qty 2

## 2019-12-14 MED ORDER — ONDANSETRON HCL 4 MG/2ML IJ SOLN
INTRAMUSCULAR | Status: AC
  Filled 2019-12-14: qty 2

## 2019-12-14 MED ORDER — PROPOFOL 10 MG/ML IV BOLUS
INTRAVENOUS | Status: DC | PRN
  Administered 2019-12-14: 50 mg via INTRAVENOUS
  Administered 2019-12-14: 150 mg via INTRAVENOUS

## 2019-12-14 MED ORDER — AMOXICILLIN 500 MG PO CAPS: 500.0000 mg | ORAL_CAPSULE | Freq: Three times a day (TID) | ORAL | 0 refills | Status: DC

## 2019-12-14 MED ORDER — OXYCODONE-ACETAMINOPHEN 5-325 MG PO TABS: 1.0000 | ORAL_TABLET | ORAL | 0 refills | Status: DC | PRN

## 2019-12-14 MED ORDER — FENTANYL CITRATE (PF) 250 MCG/5ML IJ SOLN
INTRAMUSCULAR | Status: AC
  Filled 2019-12-14: qty 5

## 2019-12-14 MED ORDER — ONDANSETRON HCL 4 MG/2ML IJ SOLN
INTRAMUSCULAR | Status: DC | PRN
  Administered 2019-12-14: 4 mg via INTRAVENOUS

## 2019-12-14 MED ORDER — SUGAMMADEX SODIUM 500 MG/5ML IV SOLN
INTRAVENOUS | Status: AC
  Filled 2019-12-14: qty 5

## 2019-12-14 MED ORDER — ALBUTEROL SULFATE HFA 108 (90 BASE) MCG/ACT IN AERS
INHALATION_SPRAY | RESPIRATORY_TRACT | Status: AC
  Filled 2019-12-14: qty 6.7

## 2019-12-14 SURGICAL SUPPLY — 37 items
BUR CROSS CUT FISSURE 1.6 (BURR) ×2 IMPLANT
BUR EGG ELITE 4.0 (BURR) ×2 IMPLANT
CANISTER SUCT 3000ML PPV (MISCELLANEOUS) ×2 IMPLANT
COVER SURGICAL LIGHT HANDLE (MISCELLANEOUS) ×2 IMPLANT
COVER WAND RF STERILE (DRAPES) ×1 IMPLANT
DECANTER SPIKE VIAL GLASS SM (MISCELLANEOUS) IMPLANT
DRAPE U-SHAPE 76X120 STRL (DRAPES) ×2 IMPLANT
GAUZE PACKING FOLDED 2  STR (GAUZE/BANDAGES/DRESSINGS) ×2
GAUZE PACKING FOLDED 2 STR (GAUZE/BANDAGES/DRESSINGS) ×1 IMPLANT
GLOVE BIO SURGEON STRL SZ 6.5 (GLOVE) ×1 IMPLANT
GLOVE BIO SURGEON STRL SZ7.5 (GLOVE) ×2 IMPLANT
GLOVE BIOGEL PI IND STRL 6.5 (GLOVE) IMPLANT
GLOVE BIOGEL PI IND STRL 7.0 (GLOVE) IMPLANT
GLOVE BIOGEL PI INDICATOR 6.5 (GLOVE) ×1
GLOVE BIOGEL PI INDICATOR 7.0 (GLOVE) ×1
GOWN STRL REUS W/ TWL LRG LVL3 (GOWN DISPOSABLE) ×1 IMPLANT
GOWN STRL REUS W/ TWL XL LVL3 (GOWN DISPOSABLE) ×1 IMPLANT
GOWN STRL REUS W/TWL LRG LVL3 (GOWN DISPOSABLE) ×2
GOWN STRL REUS W/TWL XL LVL3 (GOWN DISPOSABLE) ×2
IV NS 1000ML (IV SOLUTION) ×2
IV NS 1000ML BAXH (IV SOLUTION) ×1 IMPLANT
KIT BASIN OR (CUSTOM PROCEDURE TRAY) ×2 IMPLANT
KIT TURNOVER KIT B (KITS) ×2 IMPLANT
NDL HYPO 25GX1X1/2 BEV (NEEDLE) ×2 IMPLANT
NDL PRECISIONGLIDE 27X1.5 (NEEDLE) IMPLANT
NEEDLE HYPO 25GX1X1/2 BEV (NEEDLE) ×4 IMPLANT
NEEDLE PRECISIONGLIDE 27X1.5 (NEEDLE) IMPLANT
NS IRRIG 1000ML POUR BTL (IV SOLUTION) ×2 IMPLANT
PAD ARMBOARD 7.5X6 YLW CONV (MISCELLANEOUS) ×1 IMPLANT
POSITIONER HEAD DONUT 9IN (MISCELLANEOUS) ×2 IMPLANT
SLEEVE IRRIGATION ELITE 7 (MISCELLANEOUS) ×2 IMPLANT
SPONGE SURGIFOAM ABS GEL 12-7 (HEMOSTASIS) IMPLANT
SUT CHROMIC 3 0 PS 2 (SUTURE) ×4 IMPLANT
SYR CONTROL 10ML LL (SYRINGE) ×2 IMPLANT
TRAY ENT MC OR (CUSTOM PROCEDURE TRAY) ×2 IMPLANT
TUBING IRRIGATION (MISCELLANEOUS) ×2 IMPLANT
YANKAUER SUCT BULB TIP NO VENT (SUCTIONS) ×2 IMPLANT

## 2019-12-14 NOTE — H&P (Signed)
H&P documentation  -History and Physical Reviewed  -Patient has been re-examined  -No change in the plan of care  [name]  

## 2019-12-14 NOTE — Op Note (Signed)
12/14/2019  10:42 AM  PATIENT:  [name]  64 y.o. female  PRE-OPERATIVE DIAGNOSIS:  NONRESTORABLE TEETH # 1, 6, 7, 8, 10, 11, 13, 16, 21, 22, 23, 24, 25, 26, 27, 28, 29, 32, osteoma vs sclerotic bone right mandible  POST-OPERATIVE DIAGNOSIS:  SAME + SCLEROTIC BONE RIGHT MANDIBLE WITH RESIDUAL ROOT TOOTH #30  PROCEDURE:  Procedure(s):  EXTRACTION TEETH # 1, 6, 7, 8, 10, 11, 13, 16, 21, 22, 23, 24, 25, 26, 27, 28, 29, 32 ; ALVEOLOPLASTY RIGHT AND LEFT MAXILLA AND MANDIBLE,  REMOVAL ROOT TOOTH #30  SURGEON:  Surgeon(s): [name], DDS  ANESTHESIA:   local and general  EBL:  minimal  DRAINS: none   SPECIMEN:  No Specimen  COUNTS:  YES  PLAN OF CARE: Discharge to home after PACU  PATIENT DISPOSITION:  PACU - hemodynamically stable.   PROCEDURE DETAILS: Dictation # [id number]  Gae Bon, DMD 12/14/2019 10:42 AM

## 2019-12-14 NOTE — Anesthesia Procedure Notes (Signed)
Procedure Name: Intubation Performed by: Barrington Ellison, CRNA Pre-anesthesia Checklist: Patient identified, Emergency Drugs available, Suction available and Patient being monitored Patient Re-evaluated:Patient Re-evaluated prior to induction Oxygen Delivery Method: Circle System Utilized Preoxygenation: Pre-oxygenation with 100% oxygen Induction Type: IV induction Ventilation: Mask ventilation without difficulty and Oral airway inserted - appropriate to patient size Laryngoscope Size: Mac and 3 Grade View: Grade I Nasal Tubes: Nasal Rae, Nasal prep performed and Left Tube size: 7.0 mm Number of attempts: 1 Placement Confirmation: ETT inserted through vocal cords under direct vision,  positive ETCO2 and breath sounds checked- equal and bilateral Secured at: 26 cm Tube secured with: Tape Dental Injury: Teeth and Oropharynx as per pre-operative assessment

## 2019-12-14 NOTE — Telephone Encounter (Signed)
Message sent to scheduling to have patient placed on my schedule for BP check and labs on Monday or Tuesday.

## 2019-12-14 NOTE — Telephone Encounter (Signed)
Called patient and LVM to return call and schedule a BP check appointment with Lurena Joiner.

## 2019-12-14 NOTE — Transfer of Care (Signed)
Immediate Anesthesia Transfer of Care Note  Patient: [name]  Procedure(s) Performed: MULTIPLE EXTRACTION WITH ALVEOLOPLASTY (Bilateral )  Patient Location: PACU  Anesthesia Type:General  Level of Consciousness: awake and oriented  Airway & Oxygen Therapy: Patient Spontanous Breathing  Post-op Assessment: Report given to RN  Post vital signs: Reviewed and stable  Last Vitals:  Vitals Value Taken Time  BP 139/70 12/14/19 1049  Temp    Pulse 118 12/14/19 1050  Resp 22 12/14/19 1050  SpO2 97 % 12/14/19 1050  Vitals shown include unvalidated device data.  Last Pain:  Vitals:   12/14/19 0841  TempSrc:   PainSc: 0-No pain         Complications: No apparent anesthesia complications

## 2019-12-14 NOTE — Op Note (Signed)
NAMESANAZ, SCARLETT MEDICAL RECORD SK:[id number] ACCOUNT [id number] DATE OF BIRTH:1955/11/18 FACILITY: [facility] LOCATION: [facility] PHYSICIAN:Dionne Knoop M. Kemet Nijjar, DDS  OPERATIVE REPORT  DATE OF PROCEDURE:  12/14/2019  PREOPERATIVE DIAGNOSES:   1.  Nonrestorable teeth numbers 1, 6, 7, 8, 10, 11, 13, 16, 21, 22, 23, 24, 25, 26, 27, 28, 29, 32 secondary to severe periodontal disease and dental caries. 2.  Osteoma of sclerotic bone, right mandible.  POSTOPERATIVE DIAGNOSIS:   1.  Nonrestorable teeth numbers 1, 6, 7, 8, 10, 11, 13, 16, 21, 22, 23, 24, 25, 26, 27, 28, 29, 32 secondary to severe periodontal disease and dental caries. 2.  Osteoma of sclerotic bone, right mandible. 3.  Sclerotic bone, right mandible with residual root #30.  PROCEDURES:   1.  Extraction of teeth numbers 1, 6, 7, 8, 10, 11, 13, 16, 21, 22, 23, 24, 25, 26, 27, 28, 29, 32. 2.  Alveoplasty right and left maxilla and mandible.  3.  Removal of tooth root #30.  SURGEON:  [name], DDS  ANESTHESIA:  General, Dr. Linna Caprice attending, nasal intubation.  DESCRIPTION PROCEDURE:  The patient was taken to the operating room and placed on the table in supine position.  General anesthesia was administered intravenously and a nasal endotracheal tube was placed and secured.  The eyes were protected and the  patient was draped for surgery.  A timeout was performed.  The posterior pharynx was suctioned and a throat pack was placed.  Two percent lidocaine 1:100,000 epinephrine was infiltrated in an inferior alveolar block on the right and left sides and in  buccal and palatal infiltration in the maxilla; total of 10 mL was utilized.  A bite block was placed in the right side of the mouth and attention was turned to the left mandible.  A 15 blade used to make an incision approximately 1 cm proximal to tooth  #21 along the alveolar crest.  It was then advanced in the gingival sulcus around the teeth, both buccally and lingually until tooth  #27 was encountered.  The periosteum was reflected.  The teeth were elevated with a 301 elevator and removed from the  mouth with the dental forceps.  The sockets were curetted, irrigated.  The periosteum was reflected to expose the alveolar crest and then alveoplasty was performed using an egg-shaped bur, followed by the bone file.  Then, the area was irrigated and  closed with 3-0 chromic.  Then, in the left maxilla, a 15 blade used to make an incision around tooth #16, carried forward along the crest of the alveolus, tooth 13 and carried forward to teeth numbers 10, 11, 8 and 7.  The periosteum was reflected from  around these teeth.  The teeth were elevated and removed from the mouth with the dental forceps.  The sockets were curetted.  The periosteum was reflected to expose the alveolar crest, which was irregular in contour and then alveoplasty was performed  using the egg-shaped bur and then the bone file.  Then, the area was irrigated and closed with 3-0 chromic.  The bite block was repositioned to the other side of the mouth and then the 15 blade was used to make an incision beginning at tooth #32 in the  posterior, carried forward to tooth #27 in the mandible and from tooth #1 in the maxilla to tooth #6 in the maxilla.  The incision was made along the alveolar crest.  The periosteum was reflected from around these teeth.  The teeth were elevated.  Teeth  numbers 27 and 32 fractured upon attempted removal.  Teeth #28 and #29 were removed with the Ash forceps.  Then, bone was removed around teeth numbers 27 and 32 until the teeth could be elevated and removed and then the Stryker handpiece with the fissure  bur was used to evaluate the bone in the area anterior to tooth #32 socket.  There was some sclerotic bone and the drill was used to remove surrounding bone until it was noted that there was a root embedded in the bone.  This root was removed and then  the sclerotic bone was smoothed with the egg  bur and alveoplasty was performed using the egg-shaped bur and bone file in the right mandible.  Then, the area was irrigated and closed with 3-0 chromic.  Teeth numbers 1 and 6 were removed with the forceps  and then the sockets were curetted.  Bone was smoothed with the egg-shaped bur, followed by the bone file and then the area was irrigated and closed with 3-0 chromic.  The oral cavity was then inspected and found to have good contour and hemostasis.   Additional local anesthesia was administered, 10 mL as before to provide for postoperative analgesia.  Then, the oral cavity was irrigated and suctioned.  The throat pack was removed.  The patient was left in care of anesthesia for extubation and  transported to recovery room with plans for discharge home.  ESTIMATED BLOOD LOSS:  Minimal.  INDICATIONS:  The patient was tachycardic after intubation and remained tachycardic through the procedure.  The patient will be observed in recovery room by anesthesia with possible cardiology consult as needed.  VN/NUANCE  D:12/14/2019 T:12/14/2019 JOB:[id number]

## 2019-12-14 NOTE — Telephone Encounter (Signed)
Can we schedule this patient for a BP check and labs with me on Monday or Tuesday?

## 2019-12-15 ENCOUNTER — Other Ambulatory Visit (HOSPITAL_COMMUNITY): Payer: Medicaid Other

## 2019-12-17 ENCOUNTER — Other Ambulatory Visit (HOSPITAL_COMMUNITY)
Admission: RE | Admit: 2019-12-17 | Discharge: 2019-12-17 | Disposition: A | Discharge status: Home / Self Care | Payer: Medicaid Other | Source: Ambulatory Visit | Attending: Surgery | Admitting: Surgery

## 2019-12-17 ENCOUNTER — Encounter: Payer: Self-pay | Admitting: *Deleted

## 2019-12-17 DIAGNOSIS — Z01812 Encounter for preprocedural laboratory examination: Secondary | ICD-10-CM | POA: Insufficient documentation

## 2019-12-17 DIAGNOSIS — Z20822 Contact with and (suspected) exposure to covid-19: Secondary | ICD-10-CM | POA: Diagnosis not present

## 2019-12-17 LAB — SARS CORONAVIRUS 2 (TAT 6-24 HRS): SARS Coronavirus 2: NEGATIVE

## 2019-12-18 NOTE — Anesthesia Preprocedure Evaluation (Addendum)
Anesthesia Evaluation  Patient identified by MRN, date of birth, ID band Patient awake    Reviewed: Allergy & Precautions, NPO status , Patient's Chart, lab work & pertinent test results  Airway Mallampati: III       Dental  (+) Missing, Loose,    Pulmonary neg pulmonary ROS,    Pulmonary exam normal        Cardiovascular hypertension, Pt. on medications and Pt. on home beta blockers Normal cardiovascular exam Rhythm:Regular Rate:Normal     Neuro/Psych negative neurological ROS     GI/Hepatic negative GI ROS,   Endo/Other  Morbid obesity  Renal/GU Renal disease  negative genitourinary   Musculoskeletal   Abdominal (+) + obese,   Peds  Hematology negative hematology ROS (+)   Anesthesia Other Findings   Reproductive/Obstetrics                            Anesthesia Physical Anesthesia Plan  ASA: III  Anesthesia Plan: General   Post-op Pain Management:    Induction: Intravenous  PONV Risk Score and Plan: 3 and Ondansetron and Dexamethasone  Airway Management Planned: Oral ETT and Video Laryngoscope Planned  Additional Equipment: None  Intra-op Plan:   Post-operative Plan:   Informed Consent: I have reviewed the patients History and Physical, chart, labs and discussed the procedure including the risks, benefits and alternatives for the proposed anesthesia with the patient or authorized representative who has indicated his/her understanding and acceptance.     Dental advisory given  Plan Discussed with: CRNA  Anesthesia Plan Comments: (See PAT notes by [name], PA-C. She is for ISTAT8 day of surgery to re-evaluate Creatinine. S/p teeth extractions on 12/14/19. )      Anesthesia Quick Evaluation                                  Anesthesia Evaluation  Patient identified by MRN, date of birth, ID band Patient awake    Reviewed: Allergy & Precautions, NPO status  , Patient's Chart, lab work & pertinent test results, reviewed documented beta blocker date and time   Airway Mallampati: II  TM Distance: >3 FB Neck ROM: Full    Dental  (+) Poor Dentition, Dental Advisory Given,    Pulmonary  Likely OSA, never been tested. Snores, no witnessed apneas (lives alone)   Pulmonary exam normal breath sounds clear to auscultation       Cardiovascular hypertension, Pt. on medications and Pt. on home beta blockers negative cardio ROS Normal cardiovascular exam Rhythm:Regular Rate:Normal  Echo 12/2018 for murmur heard on exam- no abnormalities  1. The left ventricle has hyperdynamic systolic function, with an ejection fraction of >65%. The cavity size was normal. Left ventricular diastolic Doppler parameters are consistent with pseudonormalization. No evidence of left ventricular regional wall  motion abnormalities.  2. The right ventricle has normal systolic function. The cavity was normal. There is no increase in right ventricular wall thickness.  3. Left atrial size was moderately dilated.  4. The aortic root and ascending aorta are normal in size and structure.  5. The interatrial septum was not well visualized.   Neuro/Psych negative neurological ROS  negative psych ROS   GI/Hepatic negative GI ROS, (+)     substance abuse  alcohol use, Hepatitis -Hx alcoholic hepatitis   Endo/Other  Morbid obesity  Renal/GU negative Renal ROS  negative genitourinary  Musculoskeletal  (+) Arthritis , Osteoarthritis,    Abdominal (+) + obese,   Peds negative pediatric ROS (+)  Hematology negative hematology ROS (+)   Anesthesia Other Findings S/p fall from standing 06/17/19- right radial head comminuted fracture  Reproductive/Obstetrics negative OB ROS                            Anesthesia Physical Anesthesia Plan  ASA: III  Anesthesia Plan: Regional and MAC   Post-op Pain Management:  Regional for Post-op  pain   Induction:   PONV Risk Score and Plan: Propofol infusion and TIVA  Airway Management Planned: Natural Airway and Nasal Cannula  Additional Equipment: None  Intra-op Plan:   Post-operative Plan:   Informed Consent: I have reviewed the patients History and Physical, chart, labs and discussed the procedure including the risks, benefits and alternatives for the proposed anesthesia with the patient or authorized representative who has indicated his/her understanding and acceptance.       Plan Discussed with: CRNA  Anesthesia Plan Comments: (Regional/MAC, GA/LMA as backup given airway exam)       Anesthesia Quick Evaluation

## 2019-12-18 NOTE — H&P (Signed)
[name] Documented: 11/21/2019 7:18 AM Location: [facility] Patient #: [id number] DOB: September 28, 1955 Undefined / Language: Cleophus Molt / Race: Black or African American Female   History of Present Illness (Shaleta Ruacho A. Ninfa Linden MD; 11/21/2019 11:43 AM) The patient is a 64 year old female who presents with breast cancer. Chief complaint: Left breast cancer  This is a pleasant 64 year old female with multiple comorbidities who developed a mass in her left breast in the outer quadrant. She noticed it for several months. She underwent mammogram and ultrasound showing a 3.8 cm mass. There were at least 5 pathologically significant lymph nodes on ultrasound. She underwent a biopsy of the mass showing an invasive ductal carcinoma. The biopsy of lymph node was also positive for malignancy. She has had no previous surgery on her breast. She has no family history of breast cancer. She denies nipple discharge.   Past Surgical History Tawni Pummel, RN; 11/21/2019 7:19 AM) No pertinent past surgical history   Diagnostic Studies History Tawni Pummel, RN; 11/21/2019 7:19 AM) Colonoscopy  never Mammogram  within last year Pap Smear  >5 years ago  Social History Tawni Pummel, RN; 11/21/2019 7:19 AM) Alcohol use  Occasional alcohol use. No caffeine use  No drug use  Tobacco use  Never smoker.  Family History Tawni Pummel, RN; 11/21/2019 7:19 AM) Diabetes Mellitus  Son. Ovarian Cancer  Sister. Thyroid problems  Mother.  Pregnancy / Birth History Tawni Pummel, RN; 11/21/2019 7:19 AM) Age at menarche  26 years. Age of menopause  83-50 Contraceptive History  Oral contraceptives. Gravida  3 Maternal age  <15 Para  2 Regular periods   Other Problems Tawni Pummel, RN; 11/21/2019 7:19 AM) Arthritis  Diverticulosis  High blood pressure  Lump In Breast     Review of Systems Sunday Spillers Ledford RN; 11/21/2019 7:19 AM) General Present- Weight Gain.  Not Present- Appetite Loss, Chills, Fatigue, Fever, Night Sweats and Weight Loss. Skin Not Present- Change in Wart/Mole, Dryness, Hives, Jaundice, New Lesions, Non-Healing Wounds, Rash and Ulcer. HEENT Present- Seasonal Allergies and Wears glasses/contact lenses. Not Present- Earache, Hearing Loss, Hoarseness, Nose Bleed, Oral Ulcers, Ringing in the Ears, Sinus Pain, Sore Throat, Visual Disturbances and Yellow Eyes. Respiratory Not Present- Bloody sputum, Chronic Cough, Difficulty Breathing, Snoring and Wheezing. Breast Present- Breast Mass. Not Present- Breast Pain, Nipple Discharge and Skin Changes. Cardiovascular Present- Swelling of Extremities. Not Present- Chest Pain, Difficulty Breathing Lying Down, Leg Cramps, Palpitations, Rapid Heart Rate and Shortness of Breath. Gastrointestinal Not Present- Abdominal Pain, Bloating, Bloody Stool, Change in Bowel Habits, Chronic diarrhea, Constipation, Difficulty Swallowing, Excessive gas, Gets full quickly at meals, Hemorrhoids, Indigestion, Nausea, Rectal Pain and Vomiting. Female Genitourinary Present- Frequency. Not Present- Nocturia, Painful Urination, Pelvic Pain and Urgency. Musculoskeletal Not Present- Back Pain, Joint Pain, Joint Stiffness, Muscle Pain, Muscle Weakness and Swelling of Extremities. Neurological Not Present- Decreased Memory, Fainting, Headaches, Numbness, Seizures, Tingling, Tremor, Trouble walking and Weakness. Psychiatric Not Present- Anxiety, Bipolar, Change in Sleep Pattern, Depression, Fearful and Frequent crying. Endocrine Not Present- Cold Intolerance, Excessive Hunger, Hair Changes, Heat Intolerance, Hot flashes and New Diabetes. Hematology Not Present- Blood Thinners, Easy Bruising, Excessive bleeding, Gland problems, HIV and Persistent Infections.   Physical Exam (Tylin Force A. Ninfa Linden MD; 11/21/2019 11:44 AM) The physical exam findings are as follows: Note:On exam, she is a morbidly obese female in no distress.  She has  a palpable, mobile mass in the left breast in the slightly lower outer quadrant. There are enlarged palpable nodes in her  left axilla. There are no other palpable masses or abnormalities.    Assessment & Plan (Hildy Nicholl A. Ninfa Linden MD; 11/21/2019 11:46 AM) BREAST CANCER, LEFT (C50.912) Impression: I had a discussion with the patient and her sister regarding her diagnosis of right breast cancer. She was discussed in our multidisciplinary conference and clinic. Given her lymph node status, a complete axillary dissection is recommended regardless of which surgical intervention she chooses. She has already decided she wishes to proceed with a mastectomy upfront. She does not want reconstruction. I again discussed the diagnosis with her in detail and multiple treatment modalities. With her decision, the recommendation would be for proceeding with a left mastectomy and complete axillary dissection as well as a Port-A-Cath insertion for planned postoperative chemotherapy. I discussed the reasons for this with her in detail. I discussed the surgical procedure in detail. We discussed the risk which includes but is not limited to bleeding, infection, injury to surrounding structures, the need for further procedures, pneumothorax, chronic lymphedema, cardiopulmonary issues, postoperative recovery, etc. We will get a preoperative MRI as well. Surgery will be scheduled following the MRI.

## 2019-12-18 NOTE — Progress Notes (Signed)
Anesthesia Follow-up: See my PAT note for 12/14/19 multiple teeth extraction. Her Creatinine on 12/13/19 was elevated at 2.15, up from 1.59 on 11/21/19. Primary care staff notified. There was mention of scheduling pharmacist follow-up at [facility] for repeat labs and BP check and then would consider if medications should be adjusted. I don't see that this has occurred, although ISTAT8 on 12/14/19 showed Cr down to 1.90. She has scheduled follow-up with Dr. Jana Hakim on 12/25/19 and Dr. Chapman Fitch on 01/02/20. She is now scheduled for left mastectomy and Port-a-cath insertion on 12/19/19. I discussed with anesthesiologist Renold Don, MD. Plan to repeat ISTAT8 on tomorrow. Anesthesia team to follow-up results and evaluate patient prior to surgery.   12/17/19 presurgical COVID-19 test negative.    [name], PA-C Surgical Short Stay/Anesthesiology [facility] Phone [phone] [facility] Phone [phone] 12/18/2019 5:05 PM

## 2019-12-19 ENCOUNTER — Encounter (HOSPITAL_COMMUNITY)
Admission: RE | Disposition: A | Discharge status: Home / Self Care | Payer: Self-pay | Source: Home / Self Care | Attending: Surgery

## 2019-12-19 ENCOUNTER — Ambulatory Visit (HOSPITAL_COMMUNITY): Payer: Medicaid Other | Admitting: Vascular Surgery

## 2019-12-19 ENCOUNTER — Other Ambulatory Visit: Payer: Self-pay

## 2019-12-19 ENCOUNTER — Ambulatory Visit (HOSPITAL_COMMUNITY): Payer: Medicaid Other

## 2019-12-19 ENCOUNTER — Encounter (HOSPITAL_COMMUNITY): Payer: Self-pay | Admitting: Surgery

## 2019-12-19 ENCOUNTER — Observation Stay (HOSPITAL_COMMUNITY)
Admission: RE | Admit: 2019-12-19 | Discharge: 2019-12-20 | Disposition: A | Discharge status: Home / Self Care | Payer: Medicaid Other | Attending: Surgery | Admitting: Surgery

## 2019-12-19 DIAGNOSIS — Z170 Estrogen receptor positive status [ER+]: Secondary | ICD-10-CM | POA: Diagnosis not present

## 2019-12-19 DIAGNOSIS — G8918 Other acute postprocedural pain: Secondary | ICD-10-CM | POA: Diagnosis not present

## 2019-12-19 DIAGNOSIS — C50512 Malignant neoplasm of lower-outer quadrant of left female breast: Principal | ICD-10-CM | POA: Insufficient documentation

## 2019-12-19 DIAGNOSIS — I10 Essential (primary) hypertension: Secondary | ICD-10-CM | POA: Insufficient documentation

## 2019-12-19 DIAGNOSIS — C773 Secondary and unspecified malignant neoplasm of axilla and upper limb lymph nodes: Secondary | ICD-10-CM | POA: Diagnosis not present

## 2019-12-19 DIAGNOSIS — Z452 Encounter for adjustment and management of vascular access device: Secondary | ICD-10-CM | POA: Diagnosis not present

## 2019-12-19 DIAGNOSIS — Z6841 Body Mass Index (BMI) 40.0 and over, adult: Secondary | ICD-10-CM | POA: Insufficient documentation

## 2019-12-19 DIAGNOSIS — C50912 Malignant neoplasm of unspecified site of left female breast: Secondary | ICD-10-CM | POA: Diagnosis not present

## 2019-12-19 DIAGNOSIS — Z419 Encounter for procedure for purposes other than remedying health state, unspecified: Secondary | ICD-10-CM

## 2019-12-19 DIAGNOSIS — Z79899 Other long term (current) drug therapy: Secondary | ICD-10-CM | POA: Insufficient documentation

## 2019-12-19 DIAGNOSIS — Z95828 Presence of other vascular implants and grafts: Secondary | ICD-10-CM

## 2019-12-19 DIAGNOSIS — N6323 Unspecified lump in the left breast, lower outer quadrant: Secondary | ICD-10-CM | POA: Diagnosis present

## 2019-12-19 DIAGNOSIS — C50412 Malignant neoplasm of upper-outer quadrant of left female breast: Secondary | ICD-10-CM | POA: Diagnosis not present

## 2019-12-19 DIAGNOSIS — Z9012 Acquired absence of left breast and nipple: Secondary | ICD-10-CM

## 2019-12-19 HISTORY — PX: PORTACATH PLACEMENT: SHX2246

## 2019-12-19 HISTORY — PX: MASTECTOMY WITH AXILLARY LYMPH NODE DISSECTION: SHX5661

## 2019-12-19 LAB — POCT I-STAT, CHEM 8
BUN: 40 mg/dL — ABNORMAL HIGH (ref 8–23)
Calcium, Ion: 1.03 mmol/L — ABNORMAL LOW (ref 1.15–1.40)
Chloride: 104 mmol/L (ref 98–111)
Creatinine, Ser: 2.4 mg/dL — ABNORMAL HIGH (ref 0.44–1.00)
Glucose, Bld: 128 mg/dL — ABNORMAL HIGH (ref 70–99)
HCT: 31 % — ABNORMAL LOW (ref 36.0–46.0)
Hemoglobin: 10.5 g/dL — ABNORMAL LOW (ref 12.0–15.0)
Potassium: 3.9 mmol/L (ref 3.5–5.1)
Sodium: 137 mmol/L (ref 135–145)
TCO2: 23 mmol/L (ref 22–32)

## 2019-12-19 IMAGING — DX DG CHEST 1V PORT
1 series · 1 of 1 positions shown · non-contrast
Comparison: [DATE]

CLINICAL DATA: Left breast cancer, chest wall catheter placement

EXAM:
PORTABLE CHEST 1 VIEW

[chest]
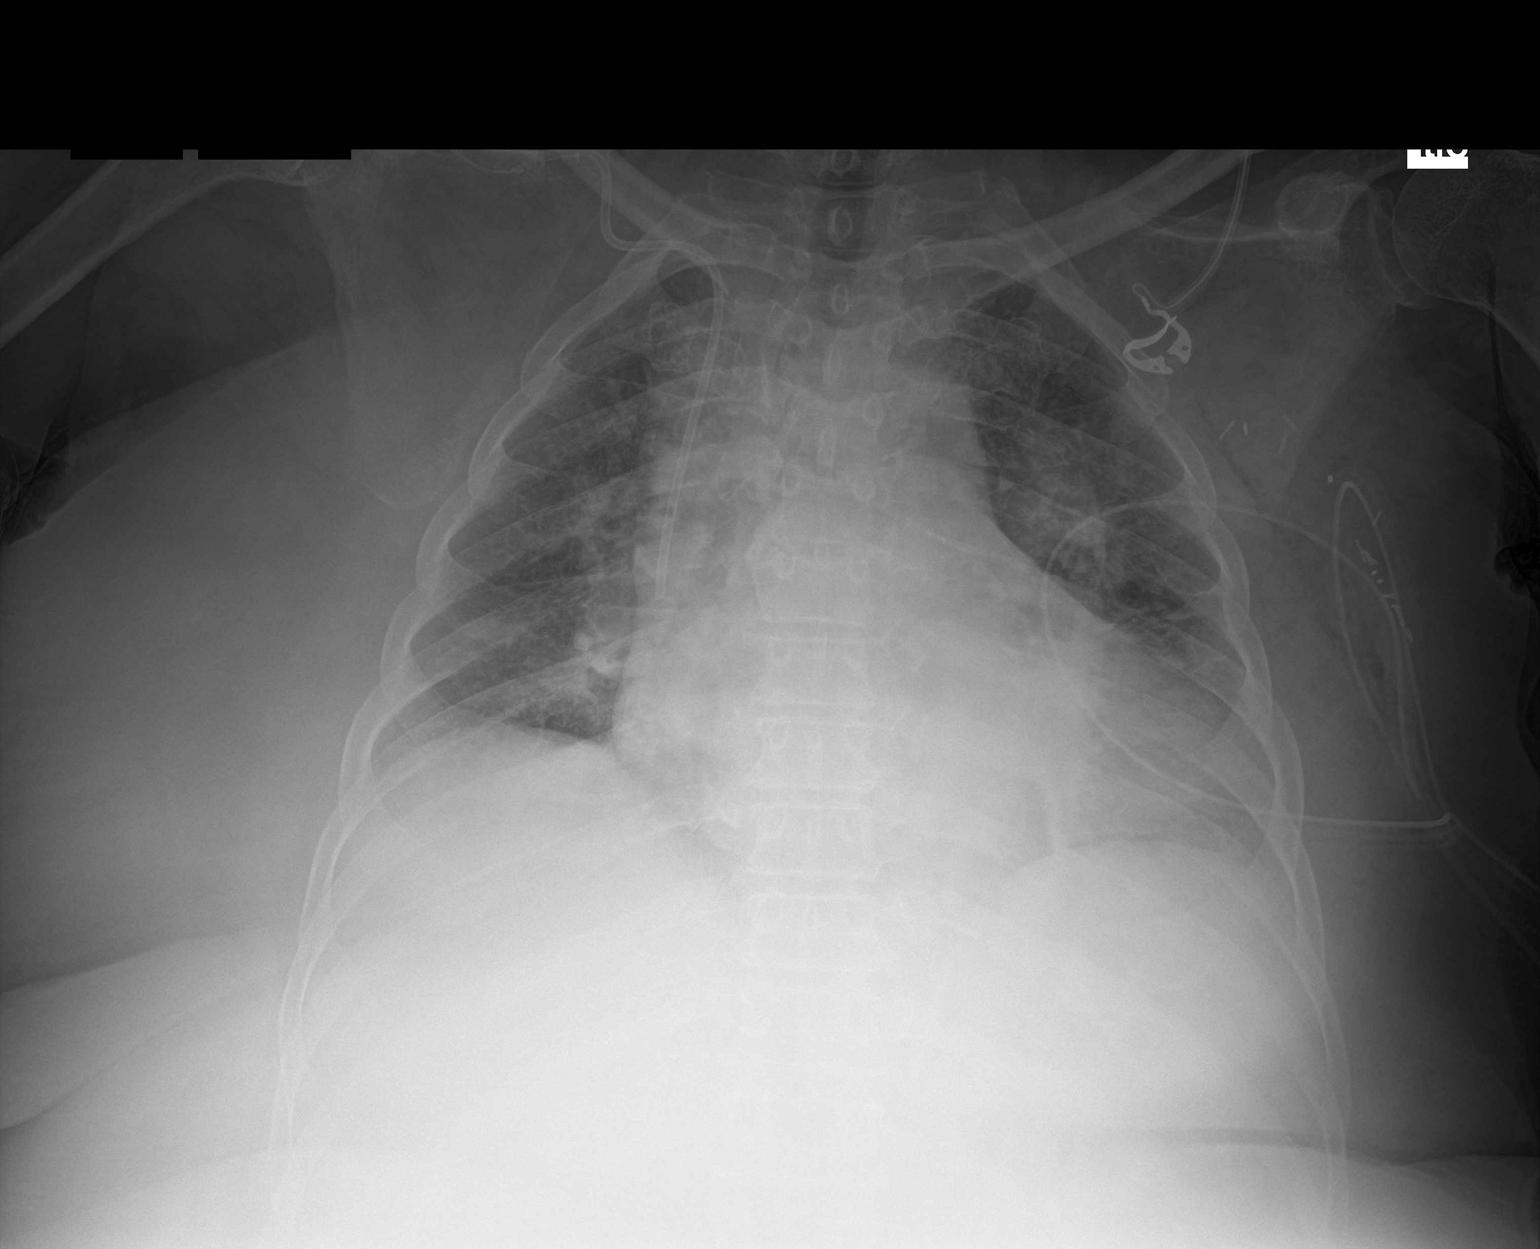

[1 of 1 positions shown; findings below may reference images not displayed]

FINDINGS: Single frontal view of the chest demonstrates right chest wall port
via subclavian approach tip overlying superior vena cava.
Postsurgical changes are seen within the left breast, with surgical
drain in place. Cardiac silhouette is unremarkable. Lung volumes are
diminished, without airspace disease, effusion, or pneumothorax.
IMPRESSION: 1. No complication after right chest wall port placement.
2. Postsurgical changes left breast.
3. Low lung volumes.

## 2019-12-19 SURGERY — MASTECTOMY WITH AXILLARY LYMPH NODE DISSECTION
Anesthesia: General | Site: Chest

## 2019-12-19 MED ORDER — FENTANYL CITRATE (PF) 250 MCG/5ML IJ SOLN
INTRAMUSCULAR | Status: AC
  Filled 2019-12-19: qty 5

## 2019-12-19 MED ORDER — HEPARIN SODIUM (PORCINE) 5000 UNIT/ML IJ SOLN
5000.0000 [IU] | Freq: Three times a day (TID) | INTRAMUSCULAR | Status: DC
  Administered 2019-12-20: 5000 [IU] via SUBCUTANEOUS
  Filled 2019-12-19: qty 1

## 2019-12-19 MED ORDER — BUPIVACAINE HCL (PF) 0.25 % IJ SOLN
INTRAMUSCULAR | Status: DC | PRN
  Administered 2019-12-19: 30 mL

## 2019-12-19 MED ORDER — 0.9 % SODIUM CHLORIDE (POUR BTL) OPTIME
TOPICAL | Status: DC | PRN
  Administered 2019-12-19: 1000 mL

## 2019-12-19 MED ORDER — FENTANYL CITRATE (PF) 100 MCG/2ML IJ SOLN: 100.0000 ug | Freq: Once | INTRAMUSCULAR | Status: AC

## 2019-12-19 MED ORDER — HYDROCHLOROTHIAZIDE 25 MG PO TABS
25.0000 mg | ORAL_TABLET | Freq: Every day | ORAL | Status: DC
  Administered 2019-12-19 – 2019-12-20 (×2): 25 mg via ORAL
  Filled 2019-12-19 (×2): qty 1

## 2019-12-19 MED ORDER — ACETAMINOPHEN 500 MG PO TABS
ORAL_TABLET | ORAL | Status: AC
  Administered 2019-12-19: 1000 mg via ORAL
  Filled 2019-12-19: qty 2

## 2019-12-19 MED ORDER — ONDANSETRON HCL 4 MG/2ML IJ SOLN: 4.0000 mg | Freq: Four times a day (QID) | INTRAMUSCULAR | Status: DC | PRN

## 2019-12-19 MED ORDER — HEPARIN SOD (PORK) LOCK FLUSH 100 UNIT/ML IV SOLN
INTRAVENOUS | Status: DC | PRN
  Administered 2019-12-19: 500 [IU] via INTRAVENOUS

## 2019-12-19 MED ORDER — GABAPENTIN 300 MG PO CAPS
ORAL_CAPSULE | ORAL | Status: AC
  Administered 2019-12-19: 300 mg via ORAL
  Filled 2019-12-19: qty 1

## 2019-12-19 MED ORDER — ACETAMINOPHEN 500 MG PO TABS: 1000.0000 mg | ORAL_TABLET | ORAL | Status: AC

## 2019-12-19 MED ORDER — ROCURONIUM BROMIDE 10 MG/ML (PF) SYRINGE
PREFILLED_SYRINGE | INTRAVENOUS | Status: AC
  Filled 2019-12-19: qty 10

## 2019-12-19 MED ORDER — CHLORHEXIDINE GLUCONATE CLOTH 2 % EX PADS: 6.0000 | MEDICATED_PAD | Freq: Once | CUTANEOUS | Status: DC

## 2019-12-19 MED ORDER — HEPARIN SOD (PORK) LOCK FLUSH 100 UNIT/ML IV SOLN
INTRAVENOUS | Status: AC
  Filled 2019-12-19: qty 5

## 2019-12-19 MED ORDER — MIDAZOLAM HCL 2 MG/2ML IJ SOLN
INTRAMUSCULAR | Status: AC
  Filled 2019-12-19: qty 2

## 2019-12-19 MED ORDER — DEXAMETHASONE SODIUM PHOSPHATE 10 MG/ML IJ SOLN
INTRAMUSCULAR | Status: AC
  Filled 2019-12-19: qty 1

## 2019-12-19 MED ORDER — MIDAZOLAM HCL 2 MG/2ML IJ SOLN
INTRAMUSCULAR | Status: AC
  Administered 2019-12-19: 1 mg via INTRAVENOUS
  Filled 2019-12-19: qty 2

## 2019-12-19 MED ORDER — DIPHENHYDRAMINE HCL 50 MG/ML IJ SOLN: 12.5000 mg | Freq: Four times a day (QID) | INTRAMUSCULAR | Status: DC | PRN

## 2019-12-19 MED ORDER — PROPOFOL 10 MG/ML IV BOLUS
INTRAVENOUS | Status: DC | PRN
  Administered 2019-12-19: 200 mg via INTRAVENOUS

## 2019-12-19 MED ORDER — ENSURE PRE-SURGERY PO LIQD: 296.0000 mL | Freq: Once | ORAL | Status: DC

## 2019-12-19 MED ORDER — MIDAZOLAM HCL 2 MG/2ML IJ SOLN: 1.0000 mg | Freq: Once | INTRAMUSCULAR | Status: AC

## 2019-12-19 MED ORDER — FENTANYL CITRATE (PF) 100 MCG/2ML IJ SOLN
INTRAMUSCULAR | Status: AC
  Administered 2019-12-19: 100 ug via INTRAVENOUS
  Filled 2019-12-19: qty 2

## 2019-12-19 MED ORDER — DEXAMETHASONE SODIUM PHOSPHATE 10 MG/ML IJ SOLN
INTRAMUSCULAR | Status: DC | PRN
  Administered 2019-12-19: 10 mg via INTRAVENOUS

## 2019-12-19 MED ORDER — PHENYLEPHRINE 40 MCG/ML (10ML) SYRINGE FOR IV PUSH (FOR BLOOD PRESSURE SUPPORT)
PREFILLED_SYRINGE | INTRAVENOUS | Status: AC
  Filled 2019-12-19: qty 10

## 2019-12-19 MED ORDER — SUGAMMADEX SODIUM 200 MG/2ML IV SOLN
INTRAVENOUS | Status: DC | PRN
  Administered 2019-12-19 (×2): 200 mg via INTRAVENOUS

## 2019-12-19 MED ORDER — ONDANSETRON HCL 4 MG/2ML IJ SOLN
INTRAMUSCULAR | Status: AC
  Filled 2019-12-19: qty 2

## 2019-12-19 MED ORDER — OXYCODONE HCL 5 MG PO TABS
5.0000 mg | ORAL_TABLET | ORAL | Status: DC | PRN
  Administered 2019-12-20: 10 mg via ORAL
  Filled 2019-12-19: qty 2

## 2019-12-19 MED ORDER — METOPROLOL TARTRATE 50 MG PO TABS
50.0000 mg | ORAL_TABLET | Freq: Two times a day (BID) | ORAL | Status: DC
  Administered 2019-12-19 – 2019-12-20 (×2): 50 mg via ORAL
  Filled 2019-12-19 (×2): qty 1

## 2019-12-19 MED ORDER — BENAZEPRIL HCL 5 MG PO TABS
20.0000 mg | ORAL_TABLET | Freq: Every day | ORAL | Status: DC
  Administered 2019-12-20: 20 mg via ORAL
  Filled 2019-12-19: qty 4

## 2019-12-19 MED ORDER — SODIUM CHLORIDE 0.9 % IV SOLN: INTRAVENOUS | Status: DC

## 2019-12-19 MED ORDER — PROPOFOL 10 MG/ML IV BOLUS
INTRAVENOUS | Status: AC
  Filled 2019-12-19: qty 20

## 2019-12-19 MED ORDER — BUPIVACAINE-EPINEPHRINE (PF) 0.5% -1:200000 IJ SOLN
INTRAMUSCULAR | Status: DC | PRN
  Administered 2019-12-19 (×6): 5 mL via PERINEURAL

## 2019-12-19 MED ORDER — MELOXICAM 7.5 MG PO TABS: 15.0000 mg | ORAL_TABLET | Freq: Every day | ORAL | Status: DC

## 2019-12-19 MED ORDER — GABAPENTIN 300 MG PO CAPS
300.0000 mg | ORAL_CAPSULE | Freq: Two times a day (BID) | ORAL | Status: DC
  Administered 2019-12-19 – 2019-12-20 (×2): 300 mg via ORAL
  Filled 2019-12-19 (×2): qty 1

## 2019-12-19 MED ORDER — SODIUM CHLORIDE 0.9 % IV SOLN
INTRAVENOUS | Status: AC
  Filled 2019-12-19: qty 1.2

## 2019-12-19 MED ORDER — TRAMADOL HCL 50 MG PO TABS: 50.0000 mg | ORAL_TABLET | Freq: Four times a day (QID) | ORAL | Status: DC | PRN

## 2019-12-19 MED ORDER — LIDOCAINE 2% (20 MG/ML) 5 ML SYRINGE
INTRAMUSCULAR | Status: DC | PRN
  Administered 2019-12-19: 100 mg via INTRAVENOUS

## 2019-12-19 MED ORDER — MIDAZOLAM HCL 2 MG/2ML IJ SOLN
INTRAMUSCULAR | Status: DC | PRN
  Administered 2019-12-19: 1 mg via INTRAVENOUS

## 2019-12-19 MED ORDER — CLONIDINE HCL (ANALGESIA) 100 MCG/ML EP SOLN
EPIDURAL | Status: DC | PRN
  Administered 2019-12-19: 100 ug

## 2019-12-19 MED ORDER — POTASSIUM CHLORIDE IN NACL 20-0.9 MEQ/L-% IV SOLN
INTRAVENOUS | Status: DC
  Filled 2019-12-19: qty 1000

## 2019-12-19 MED ORDER — ENOXAPARIN SODIUM 30 MG/0.3ML ~~LOC~~ SOLN: 30.0000 mg | SUBCUTANEOUS | Status: DC

## 2019-12-19 MED ORDER — LIDOCAINE 2% (20 MG/ML) 5 ML SYRINGE
INTRAMUSCULAR | Status: AC
  Filled 2019-12-19: qty 5

## 2019-12-19 MED ORDER — DIPHENHYDRAMINE HCL 12.5 MG/5ML PO ELIX: 12.5000 mg | ORAL_SOLUTION | Freq: Four times a day (QID) | ORAL | Status: DC | PRN

## 2019-12-19 MED ORDER — ROCURONIUM BROMIDE 10 MG/ML (PF) SYRINGE
PREFILLED_SYRINGE | INTRAVENOUS | Status: DC | PRN
  Administered 2019-12-19: 80 mg via INTRAVENOUS

## 2019-12-19 MED ORDER — SODIUM CHLORIDE 0.9 % IV SOLN: INTRAVENOUS | Status: DC | PRN

## 2019-12-19 MED ORDER — GABAPENTIN 300 MG PO CAPS: 300.0000 mg | ORAL_CAPSULE | ORAL | Status: AC

## 2019-12-19 MED ORDER — MORPHINE SULFATE (PF) 2 MG/ML IV SOLN: 1.0000 mg | INTRAVENOUS | Status: DC | PRN

## 2019-12-19 MED ORDER — DEXTROSE 5 % IV SOLN
3.0000 g | Freq: Once | INTRAVENOUS | Status: AC
  Administered 2019-12-19: 3 g via INTRAVENOUS
  Filled 2019-12-19: qty 3

## 2019-12-19 MED ORDER — LACTATED RINGERS IV SOLN: INTRAVENOUS | Status: DC | PRN

## 2019-12-19 MED ORDER — FENTANYL CITRATE (PF) 250 MCG/5ML IJ SOLN
INTRAMUSCULAR | Status: DC | PRN
  Administered 2019-12-19 (×2): 50 ug via INTRAVENOUS
  Administered 2019-12-19: 100 ug via INTRAVENOUS

## 2019-12-19 MED ORDER — ONDANSETRON 4 MG PO TBDP: 4.0000 mg | ORAL_TABLET | Freq: Four times a day (QID) | ORAL | Status: DC | PRN

## 2019-12-19 MED ORDER — PHENYLEPHRINE 40 MCG/ML (10ML) SYRINGE FOR IV PUSH (FOR BLOOD PRESSURE SUPPORT)
PREFILLED_SYRINGE | INTRAVENOUS | Status: DC | PRN
  Administered 2019-12-19 (×3): 80 ug via INTRAVENOUS

## 2019-12-19 MED ORDER — ONDANSETRON HCL 4 MG/2ML IJ SOLN
INTRAMUSCULAR | Status: DC | PRN
  Administered 2019-12-19: 4 mg via INTRAVENOUS

## 2019-12-19 MED ORDER — METHOCARBAMOL 500 MG PO TABS: 500.0000 mg | ORAL_TABLET | Freq: Four times a day (QID) | ORAL | Status: DC | PRN

## 2019-12-19 MED ORDER — FUROSEMIDE 40 MG PO TABS
40.0000 mg | ORAL_TABLET | Freq: Every day | ORAL | Status: DC
  Administered 2019-12-19 – 2019-12-20 (×2): 40 mg via ORAL
  Filled 2019-12-19 (×2): qty 1

## 2019-12-19 MED ORDER — BUPIVACAINE HCL (PF) 0.25 % IJ SOLN
INTRAMUSCULAR | Status: AC
  Filled 2019-12-19: qty 30

## 2019-12-19 MED ORDER — ARTIFICIAL TEARS OPHTHALMIC OINT
TOPICAL_OINTMENT | OPHTHALMIC | Status: AC
  Filled 2019-12-19: qty 3.5

## 2019-12-19 SURGICAL SUPPLY — 53 items
ADH SKN CLS APL DERMABOND .7 (GAUZE/BANDAGES/DRESSINGS) ×4
APL PRP STRL LF DISP 70% ISPRP (MISCELLANEOUS) ×2
APPLIER CLIP 9.375 MED OPEN (MISCELLANEOUS) ×6
APR CLP MED 9.3 20 MLT OPN (MISCELLANEOUS) ×4
BAG DECANTER FOR FLEXI CONT (MISCELLANEOUS) ×3 IMPLANT
BINDER BREAST 3XL (GAUZE/BANDAGES/DRESSINGS) ×1 IMPLANT
BIOPATCH RED 1 DISK 7.0 (GAUZE/BANDAGES/DRESSINGS) ×2 IMPLANT
CANISTER SUCT 3000ML PPV (MISCELLANEOUS) ×3 IMPLANT
CHLORAPREP W/TINT 26 (MISCELLANEOUS) ×3 IMPLANT
CLIP APPLIE 9.375 MED OPEN (MISCELLANEOUS) ×2 IMPLANT
COVER SURGICAL LIGHT HANDLE (MISCELLANEOUS) ×3 IMPLANT
COVER WAND RF STERILE (DRAPES) ×3 IMPLANT
DERMABOND ADVANCED (GAUZE/BANDAGES/DRESSINGS) ×2
DERMABOND ADVANCED .7 DNX12 (GAUZE/BANDAGES/DRESSINGS) ×2 IMPLANT
DRAIN CHANNEL 19F RND (DRAIN) ×6 IMPLANT
DRAPE C-ARM 42X120 X-RAY (DRAPES) ×3 IMPLANT
DRAPE CHEST BREAST 15X10 FENES (DRAPES) ×3 IMPLANT
DRAPE UTILITY XL STRL (DRAPES) ×6 IMPLANT
DRSG PAD ABDOMINAL 8X10 ST (GAUZE/BANDAGES/DRESSINGS) ×2 IMPLANT
DRSG TEGADERM 4X4.75 (GAUZE/BANDAGES/DRESSINGS) ×3 IMPLANT
ELECT CAUTERY BLADE 6.4 (BLADE) ×3 IMPLANT
ELECT REM PT RETURN 9FT ADLT (ELECTROSURGICAL) ×3
ELECTRODE REM PT RTRN 9FT ADLT (ELECTROSURGICAL) ×2 IMPLANT
EVACUATOR SILICONE 100CC (DRAIN) ×6 IMPLANT
GAUZE SPONGE 2X2 8PLY STRL LF (GAUZE/BANDAGES/DRESSINGS) ×2 IMPLANT
GAUZE SPONGE 4X4 12PLY STRL (GAUZE/BANDAGES/DRESSINGS) ×3 IMPLANT
GEL ULTRASOUND 20GR AQUASONIC (MISCELLANEOUS) IMPLANT
GLOVE SURG SIGNA 7.5 PF LTX (GLOVE) ×4 IMPLANT
GOWN STRL REUS W/ TWL LRG LVL3 (GOWN DISPOSABLE) ×2 IMPLANT
GOWN STRL REUS W/ TWL XL LVL3 (GOWN DISPOSABLE) ×2 IMPLANT
GOWN STRL REUS W/TWL LRG LVL3 (GOWN DISPOSABLE) ×3
GOWN STRL REUS W/TWL XL LVL3 (GOWN DISPOSABLE) ×3
KIT BASIN OR (CUSTOM PROCEDURE TRAY) ×3 IMPLANT
KIT PORT POWER 8FR ISP CVUE (Port) ×1 IMPLANT
KIT TURNOVER KIT B (KITS) ×3 IMPLANT
NDL HYPO 25GX1X1/2 BEV (NEEDLE) IMPLANT
NEEDLE HYPO 25GX1X1/2 BEV (NEEDLE) ×3 IMPLANT
NS IRRIG 1000ML POUR BTL (IV SOLUTION) ×3 IMPLANT
PACK GENERAL/GYN (CUSTOM PROCEDURE TRAY) ×3 IMPLANT
PAD ARMBOARD 7.5X6 YLW CONV (MISCELLANEOUS) ×6 IMPLANT
PENCIL SMOKE EVACUATOR (MISCELLANEOUS) ×3 IMPLANT
POSITIONER HEAD DONUT 9IN (MISCELLANEOUS) ×3 IMPLANT
SPONGE GAUZE 2X2 STER 10/PKG (GAUZE/BANDAGES/DRESSINGS) ×1
STAPLER VISISTAT 35W (STAPLE) ×3 IMPLANT
SUT ETHILON 3 0 FSL (SUTURE) ×6 IMPLANT
SUT MNCRL AB 4-0 PS2 18 (SUTURE) ×4 IMPLANT
SUT PROLENE 2 0 SH 30 (SUTURE) ×4 IMPLANT
SUT VIC AB 3-0 SH 27 (SUTURE) ×3
SUT VIC AB 3-0 SH 27XBRD (SUTURE) ×2 IMPLANT
SYR 5ML LUER SLIP (SYRINGE) ×3 IMPLANT
SYR CONTROL 10ML LL (SYRINGE) ×1 IMPLANT
TOWEL GREEN STERILE (TOWEL DISPOSABLE) ×3 IMPLANT
TOWEL GREEN STERILE FF (TOWEL DISPOSABLE) ×3 IMPLANT

## 2019-12-19 NOTE — Op Note (Addendum)
[name] 12/19/2019   Pre-op Diagnosis: LEFT BREAST CANCER     Post-op Diagnosis: same  Procedure(s): LEFT MODIFIED RADICAL MASTECTOMY WITH COMPLETE DEEP AXILLARY LYMPH NODE DISSECTION RIGHT SUBCLAVIAN VEIN PORT A CATH INSERTION  Surgeon(s): [name], MD  Anesthesia: General  Staff:  Circulator: Marliss Czar, RN Radiology Technologist: Jerl Mina Scrub Person: Mauricia Area; Dollene Cleveland T Circulator Assistant: Cyd Silence, RN RN First Assistant: Cyd Silence, RN  Estimated Blood Loss: Minimal               Specimens: SENT TO PATH  Indications: This is a 64 year old female who presents with a large left breast cancer and positive lymph nodes on biopsy.  The decision was made to proceed with a Port-A-Cath insertion as well as a left mastectomy and complete axillary lymph node dissection  Procedure: Patient brought to operating identifies correct patient.  She is placed upon the operating table general esthesia was induced.  Her right chest and neck were then prepped and draped in usual sterile fashion.  Using introducer needle was able to cannulate the right subclavian vein.  I passed a wire through the needle into the central venous system under direct fluoroscopy.  The wire was then removed.  I then made incision along the chest incorporating the wire entry site with a scalpel.  I then created a pocket for the port with the cautery.  An 8 French [place] port was brought to the field.  It fit easily into the pocket.  I then fed the introducer sheath and dilator over the wire and into the central venous system.  I then removed the dilator and wire.  The catheter was attached the port after being flushed it was cut appropriate length.  I then put the port into the pocket and fed the catheter down the peel-away sheath.  Sheath peeled away leaving the catheter in central venous system.  I accessed the port and good flush and return were  demonstrated.  Fluoroscopy confirmed placement in the vena cava.  I then sewed the port to the chest wall with 2 separate 2-0 Prolene sutures.  I then instilled concentrated heparin solution into the port.  I then closed subcutaneous tissue with interrupted 3-0 Vicryl sutures and closed skin with running 4-0 Monocryl Dermabond then applied.  The patient was then reprepped and draped.  I made elliptical incision on the patient's left chest going medial to lateral incorporating the nipple areolar complex and moving toward the axilla.  I then created superior and inferior skin flaps with electrocautery circumferentially.  I took the superior flap down to the chest wall and then moved toward the axilla.  I likewise took the inferior skin flap down to the inframammary ridge going medial lateral as well.  There is a large palpable mass in the lateral breast.  I took the breast off the pectoralis muscle moving medial to lateral with electrocautery until I reached the edge of the pectoralis muscle.  The patient had multiple large palpable deep axillary lymph nodes.  I then performed a complete axillary lymph node dissection.  I was able to use identify the axillary vein and thoracodorsal nerves and vessels.  I placed several surgical clips and some bridging veins during the dissection and remove the complete axillary contents with again multiple enlarged lymph nodes.  The entire mastectomy and lymph node package were then sent to pathology for evaluation.  I achieved hemostasis in the axilla with the cautery and surgical  clips.  We then irrigated the skin flaps chest wound and axilla with saline.  Again hemostasis appeared to be achieved.  We made 2 separate skin incisions and I placed Comern­o drains into the axilla and mastectomy site.  These were sewn in place with nylon sutures.  The patient subcutaneous tissue was then closed interrupted 3-0 Vicryl sutures and the skin was closed with running 4-0 Monocryl.   Dermabond was applied.  Gauze and tape were then applied.  The patient tolerated procedure well.  All the counts were correct at the end of procedure.  The patient was then extubated in the operating and taken in a stable condition to the recovery room.          [name]   Date: 12/19/2019  Time: 12:52 PM

## 2019-12-19 NOTE — Anesthesia Postprocedure Evaluation (Signed)
Anesthesia Post Note  Patient: [name]  Procedure(s) Performed: LEFT MASTECTOMY WITH AXILLARY LYMPH NODE DISSECTION (Left Breast) INSERTION PORT-A-CATH WITH ULTRASOUND GUIDANCE (N/A Chest)     Patient location during evaluation: PACU Anesthesia Type: General Level of consciousness: awake and sedated Pain management: pain level controlled Vital Signs Assessment: post-procedure vital signs reviewed and stable Respiratory status: spontaneous breathing Cardiovascular status: stable Postop Assessment: no apparent nausea or vomiting Anesthetic complications: no    Last Vitals:  Vitals:   12/19/19 1530 12/19/19 1600  BP: 109/68 (!) 107/59  Pulse: 65 71  Resp: 16 15  Temp:    SpO2: 100% 96%    Last Pain:  Vitals:   12/19/19 1530  TempSrc:   PainSc: Asleep   Pain Goal:                   Huston Foley

## 2019-12-19 NOTE — Anesthesia Procedure Notes (Signed)
Procedure Name: Intubation Date/Time: 12/19/2019 11:41 AM Performed by: Kyung Rudd, CRNA Pre-anesthesia Checklist: Patient identified, Emergency Drugs available, Suction available and Patient being monitored Patient Re-evaluated:Patient Re-evaluated prior to induction Oxygen Delivery Method: Circle system utilized Preoxygenation: Pre-oxygenation with 100% oxygen Induction Type: IV induction Ventilation: Mask ventilation without difficulty and Oral airway inserted - appropriate to patient size Laryngoscope Size: Mac and 3 Grade View: Grade I Tube type: Oral Tube size: 7.0 mm Number of attempts: 1 Airway Equipment and Method: Stylet Placement Confirmation: ETT inserted through vocal cords under direct vision,  positive ETCO2 and breath sounds checked- equal and bilateral Secured at: 21 cm Tube secured with: Tape Dental Injury: Teeth and Oropharynx as per pre-operative assessment

## 2019-12-19 NOTE — Transfer of Care (Signed)
Immediate Anesthesia Transfer of Care Note  Patient: [name]  Procedure(s) Performed: LEFT MASTECTOMY WITH AXILLARY LYMPH NODE DISSECTION (Left Breast) INSERTION PORT-A-CATH WITH ULTRASOUND GUIDANCE (N/A Chest)  Patient Location: PACU  Anesthesia Type:GA combined with regional for post-op pain  Level of Consciousness: awake, alert  and oriented  Airway & Oxygen Therapy: Patient Spontanous Breathing and Patient connected to nasal cannula oxygen  Post-op Assessment: Report given to RN, Post -op Vital signs reviewed and stable and Patient moving all extremities  Post vital signs: Reviewed and stable  Last Vitals:  Vitals Value Taken Time  BP 123/64 12/19/19 1400  Temp 36.3 C 12/19/19 1400  Pulse 73 12/19/19 1401  Resp 49 12/19/19 1401  SpO2 100 % 12/19/19 1401  Vitals shown include unvalidated device data.  Last Pain:  Vitals:   12/19/19 1400  TempSrc:   PainSc: (P) 0-No pain         Complications: No apparent anesthesia complications

## 2019-12-19 NOTE — Progress Notes (Signed)
Received patient from PACU. Patient alert and oriented. Left breast skin glue and right chest porta cath clean, dry, and intact. Patient oriented to call bell and bed controls. Instructed patient not to self ambulate and to utilize call bell for assistance. Will continue to monitor.

## 2019-12-19 NOTE — Interval H&P Note (Signed)
History and Physical Interval Note:no change in H and P  12/19/2019 10:41 AM  [name]  has presented today for surgery, with the diagnosis of LEFT BREAST CANCER.  The various methods of treatment have been discussed with the patient and family. After consideration of risks, benefits and other options for treatment, the patient has consented to  Procedure(s): LEFT MASTECTOMY WITH AXILLARY LYMPH NODE DISSECTION (Left) INSERTION PORT-A-CATH WITH ULTRASOUND GUIDANCE (N/A) as a surgical intervention.  The patient's history has been reviewed, patient examined, no change in status, stable for surgery.  I have reviewed the patient's chart and labs.  Questions were answered to the patient's satisfaction.     [name]

## 2019-12-19 NOTE — Anesthesia Procedure Notes (Signed)
Anesthesia Regional Block: Pectoralis block   Pre-Anesthetic Checklist: ,, timeout performed, Correct Patient, Correct Site, Correct Laterality, Correct Procedure, Correct Position, site marked, Risks and benefits discussed,  Surgical consent,  Pre-op evaluation,  At surgeon's request and post-op pain management  Laterality: Left and N/A  Prep: chloraprep       Needles:  Injection technique: Single-shot  Needle Type: Echogenic Stimulator Needle     Needle Length: 10cm  Needle Gauge: 21   Needle insertion depth: 4 cm   Additional Needles:   Procedures:,,,, ultrasound used (permanent image in chart),,,,  Narrative:  Start time: 12/19/2019 11:05 AM End time: 12/19/2019 11:15 AM Injection made incrementally with aspirations every 5 mL.  Performed by: Personally  Anesthesiologist: Lyn Hollingshead, MD

## 2019-12-20 ENCOUNTER — Encounter: Payer: Self-pay | Admitting: *Deleted

## 2019-12-20 DIAGNOSIS — C50512 Malignant neoplasm of lower-outer quadrant of left female breast: Secondary | ICD-10-CM | POA: Diagnosis not present

## 2019-12-20 LAB — CBC
HCT: 27.1 % — ABNORMAL LOW (ref 36.0–46.0)
Hemoglobin: 8.8 g/dL — ABNORMAL LOW (ref 12.0–15.0)
MCH: 33.5 pg (ref 26.0–34.0)
MCHC: 32.5 g/dL (ref 30.0–36.0)
MCV: 103 fL — ABNORMAL HIGH (ref 80.0–100.0)
Platelets: 247 10*3/uL (ref 150–400)
RBC: 2.63 MIL/uL — ABNORMAL LOW (ref 3.87–5.11)
RDW: 13 % (ref 11.5–15.5)
WBC: 9.8 10*3/uL (ref 4.0–10.5)
nRBC: 0.2 % (ref 0.0–0.2)

## 2019-12-20 LAB — BASIC METABOLIC PANEL
Anion gap: 9 (ref 5–15)
BUN: 41 mg/dL — ABNORMAL HIGH (ref 8–23)
CO2: 22 mmol/L (ref 22–32)
Calcium: 7.7 mg/dL — ABNORMAL LOW (ref 8.9–10.3)
Chloride: 105 mmol/L (ref 98–111)
Creatinine, Ser: 1.93 mg/dL — ABNORMAL HIGH (ref 0.44–1.00)
GFR calc Af Amer: 31 mL/min — ABNORMAL LOW (ref >60)
GFR calc non Af Amer: 27 mL/min — ABNORMAL LOW (ref >60)
Glucose, Bld: 137 mg/dL — ABNORMAL HIGH (ref 70–99)
Potassium: 5.3 mmol/L — ABNORMAL HIGH (ref 3.5–5.1)
Sodium: 136 mmol/L (ref 135–145)

## 2019-12-20 NOTE — Discharge Instructions (Signed)
[facility] °[phone] ° °MASTECTOMY: POST OP INSTRUCTIONS ° °Always review your discharge instruction sheet given to you by the facility where your surgery was performed. °IF YOU HAVE DISABILITY OR FAMILY LEAVE FORMS, YOU MUST BRING THEM TO THE OFFICE FOR PROCESSING.   °DO NOT GIVE THEM TO YOUR DOCTOR. °A prescription for pain medication may be given to you upon discharge.  Take your pain medication as prescribed, if needed.  If narcotic pain medicine is not needed, then you may take acetaminophen (Tylenol) or ibuprofen (Advil) as needed. °1. Take your usually prescribed medications unless otherwise directed. °2. If you need a refill on your pain medication, please contact your pharmacy.  They will contact our office to request authorization.  Prescriptions will not be filled after 5pm or on week-ends. °3. You should follow a light diet the first few days after arrival home, such as soup and crackers, etc.  Resume your normal diet the day after surgery. °4. Most patients will experience some swelling and bruising on the chest and underarm.  Ice packs will help.  Swelling and bruising can take several days to resolve.  °5. It is common to experience some constipation if taking pain medication after surgery.  Increasing fluid intake and taking a stool softener (such as Colace) will usually help or prevent this problem from occurring.  A mild laxative (Milk of Magnesia or Miralax) should be taken according to package instructions if there are no bowel movements after 48 hours. °6. Unless discharge instructions indicate otherwise, leave your bandage dry and in place until your next appointment in 3-5 days.  You may take a limited sponge bath.  No tube baths or showers until the drains are removed.  You may have steri-strips (small skin tapes) in place directly over the incision.  These strips should be left on the skin for 7-10 days.  If your surgeon used skin glue on the incision, you may  shower in 24 hours.  The glue will flake off over the next 2-3 weeks.  Any sutures or staples will be removed at the office during your follow-up visit. °7. DRAINS:  If you have drains in place, it is important to keep a list of the amount of drainage produced each day in your drains.  Before leaving the hospital, you should be instructed on drain care.  Call our office if you have any questions about your drains. °8. ACTIVITIES:  You may resume regular (light) daily activities beginning the next day--such as daily self-care, walking, climbing stairs--gradually increasing activities as tolerated.  You may have sexual intercourse when it is comfortable.  Refrain from any heavy lifting or straining until approved by your doctor. °a. You may drive when you are no longer taking prescription pain medication, you can comfortably wear a seatbelt, and you can safely maneuver your car and apply brakes. °b. RETURN TO WORK:  __________________________________________________________ °9. You should see your doctor in the office for a follow-up appointment approximately 3-5 days after your surgery.  Your doctor’s nurse will typically make your follow-up appointment when she calls you with your pathology report.  Expect your pathology report 2-3 business days after your surgery.  You may call to check if you do not hear from us after three days.   °10. OTHER INSTRUCTIONS: ______________________________________________________________________________________________ ____________________________________________________________________________________________ °WHEN TO CALL YOUR DOCTOR: °1. Fever over 101.0 °2. Nausea and/or vomiting °3. Extreme swelling or bruising °4. Continued bleeding from incision. °5. Increased pain, redness, or drainage from the incision. °  The clinic staff is available to answer your questions during regular business hours.  Please don’t hesitate to call and ask to speak to one of the nurses for clinical  concerns.  If you have a medical emergency, go to the nearest emergency room or call 911.  A surgeon from [facility] is always on call at the hospital. °[address], [address], [place], [place]  [address] ? [address] [address], [place], [place]   [address] °[phone] ? [phone] ? [phone] [phone] °Web site: [email or web address] °

## 2019-12-20 NOTE — Progress Notes (Signed)
Patient ID: [name], female   DOB: 03-04-1956, 64 y.o.   MRN: [id number]   Doing well  Pain well controlled Left mastectomy flaps clean Drains serosang Awake and alert Creatinine improved  Plan: D/c home today Will need home health for assistance with drain care as she lives alsone

## 2019-12-20 NOTE — Progress Notes (Signed)
Patient discharged to home with instructions given to patient and her daughter in law Ms. Surgeon, also sent some supplies.

## 2019-12-20 NOTE — Discharge Summary (Signed)
Physician Discharge Summary  Patient ID: [name] MRN: [id number] DOB/AGE: September 01, 1955 64 y.o.  Admit date: 12/19/2019 Discharge date: 12/20/2019  Admission Diagnoses:  Discharge Diagnoses:  Active Problems:   S/P left mastectomy   Discharged Condition: good  Hospital Course: uneventful post op recovery.  Discharged home POD#! After home health arranged  Consults: None  Significant Diagnostic Studies:   Treatments: surgery: left modified radical mastectomy and port a cath insertion  Discharge Exam: Blood pressure 115/68, pulse 86, temperature 97.8 F (36.6 C), temperature source Oral, resp. rate 17, height 5\' 1"  (1.549 m), weight (!) 144.7 kg, SpO2 100 %. General appearance: alert, cooperative and no distress Resp: clear to auscultation bilaterally Cardio: regular rate and rhythm, S1, S2 normal, no murmur, click, rub or gallop Incision/Wound:left mastectomy site clean, no hematoma, drains serosang  Disposition: Discharge disposition: 01-Home or Self Care        Allergies as of 12/20/2019      Reactions   Amlodipine Swelling   BLE edema      Medication List    TAKE these medications   amoxicillin 500 MG capsule Commonly known as: AMOXIL Take 1 capsule (500 mg total) by mouth 3 (three) times daily.   benazepril 20 MG tablet Commonly known as: LOTENSIN TAKE 1 TABLET (20 MG TOTAL) BY MOUTH DAILY. TO LOWER BLOOD PRESSURE What changed: additional instructions   Centrum Silver Adult 50+ Tabs Take 1 tablet by mouth daily.   HAIR SKIN AND NAILS FORMULA PO Take 1 tablet by mouth daily at 2 PM.   [employer] Advanced BP Monitor Devi Measure blood pressure daily and record in diary   furosemide 40 MG tablet Commonly known as: LASIX Take 40 mg by mouth daily.   hydrochlorothiazide 25 MG tablet Commonly known as: HYDRODIURIL Take 1 tablet (25 mg total) by mouth daily.   Klor-Con M20 20 MEQ tablet Generic drug: potassium chloride SA TAKE 1 TABLET BY MOUTH EVERY  DAY What changed: how much to take   meloxicam 15 MG tablet Commonly known as: MOBIC Take 1 tablet (15 mg total) by mouth daily.   metoprolol tartrate 50 MG tablet Commonly known as: LOPRESSOR Take 1 tablet (50 mg total) by mouth 2 (two) times daily.   ondansetron 4 MG tablet Commonly known as: ZOFRAN Take 1-2 tablets (4-8 mg total) by mouth every 8 (eight) hours as needed for nausea or vomiting.   oxyCODONE-acetaminophen 5-325 MG tablet Commonly known as: Percocet Take 1 tablet by mouth every 4 (four) hours as needed.        Signed: [name] 12/20/2019, 9:35 AM

## 2019-12-21 ENCOUNTER — Telehealth: Payer: Self-pay

## 2019-12-21 LAB — SURGICAL PATHOLOGY

## 2019-12-21 NOTE — Telephone Encounter (Signed)
Transition Care Management Follow-up Telephone Call  Date of discharge and from where: 12/20/2019, [facility]  How have you been since you were released from the hospital?she said she is " doing great" no troubles and no pain.    Any questions or concerns? none at this time  Items Reviewed:  Did the pt receive and understand the discharge instructions provided?yes she received them and she has no questions.   Medications obtained and verified?  she said she has her medications. No changes made and she has no questions about the meds.  Any new allergies since your discharge?  none reported   Do you have support at home? yes, she lives alone but her mother and sister have been staying with her.   Other (ie: DME, Home Health, etc) she said her neighbor who is a personal care attendant comes twice daily to help her empty her 2 drains.   She does not have a home BP monitor  Functional Questionnaire: (I = Independent and D = Dependent) ADL's: independent.  Family assists as needed   Follow up appointments reviewed:    PCP Hospital f/u appt confirmed? appointment with Dr Chapman Fitch, 01/02/2020 @ [facility] f/u appt confirmed? .oncology follow up 12/25/2019  Are transportation arrangements needed? no, she said that her niece and daughter in law provide transportation.   If their condition worsens, is the pt aware to call  their PCP or go to the ED?  yes  Was the patient provided with contact information for the PCP's office or ED?  She has the clinic phone number  Was the pt encouraged to call back with questions or concerns? yes

## 2019-12-24 ENCOUNTER — Encounter: Payer: Self-pay | Admitting: *Deleted

## 2019-12-24 NOTE — Progress Notes (Signed)
[place]  [phone] [phone]     ID: [name] DOB: [birth year]  MR#: [id number]  KDX#:[id number]  Patient Care Team: [name], MD as PCP - General (Family Medicine) Mauro Kaufmann, RN as Oncology Nurse Navigator Rockwell Germany, RN as Oncology Nurse Navigator [name], MD as Consulting Physician (General Surgery) Darren Caldron, Virgie Dad, MD as Consulting Physician (Oncology) Kyung Rudd, MD as Consulting Physician (Radiation Oncology) Edrick Kins, DPM as Consulting Physician (Podiatry) Chauncey Cruel, MD OTHER MD:  CHIEF COMPLAINT: estrogen receptor positive breast cancer  CURRENT TREATMENT: Adjuvant chemotherapy pending   INTERVAL HISTORY: [name] returns today for follow up of her estrogen receptor positive breast cancer. She was evaluated in the multidisciplinary breast cancer clinic on 11/21/2019.  Since consultation, she underwent echocardiogram on 11/26/2019 showing an ejection fraction of 65-70%.  Her genetic testing results returned on 11/30/2019 showing no mutations.  She underwent breast MRI on 12/07/3030 showing: breast composition B; 4.9 cm biopsy-proven malignancy over posterior left upper-outer quadrant, no evidence of multifocal or multicentric disease; left axillary adenopathy compatible with known metastatic disease; no suspicious right breast findings.  She opted to proceed with left mastectomy and left axillary dissection on 12/19/2019 under Dr. Ninfa Linden. Pathology from the procedure ([id number]) showed: invasive ductal carcinoma, grade 3, 5 cm, with a prominent lymphocytic response; negative margins.  Out of 13 biopsied lymph nodes, one was positive for metastatic carcinoma (1/13).  She also underwent multiple tooth extractions on 12/14/2019 under Dr. Hoyt Koch.   REVIEW OF SYSTEMS: Rashanda did remarkably well with her surgery.  She never took any pain medication.  She has not had any bleeding.  She has  had some drainage of course, with 2 drain still in place but she tells me the drainage into the bulbs has been steadily decreasing, and it is now minimal and much clearer.  She is high fall risk and had fallen several times prior to the start of the cancer problem because her leg might give way.  She was supposed to have gone to physical therapy but of course the cancer issue intervened.  Aside from this a detailed review of systems today was noncontributory and in particular she denies unusual headaches visual changes nausea vomiting dizziness or any neck stiffness.  There have been no cough or phlegm production and no change in bowel or bladder habits.   HISTORY OF CURRENT ILLNESS: From the original intake note:  [name] (pronounced "yellow") had routine screening mammography on 10/24/2019 showing a possible abnormality in the left breast. She underwent left breast ultrasonography at [facility] on 11/05/2019 showing: suspicious 3.8 cm mass in left breast at 3 o'clock; five abnormal lymph nodes.  Accordingly on 11/12/2019 she proceeded to biopsy of the left breast area in question as well as a lymph node. The pathology from this procedure ([id number]) showed: invasive ductal carcinoma with prominent inflammatory response, grade 3. Prognostic indicators significant for: estrogen receptor, 100% positive and progesterone receptor, 50% positive, both with strong staining intensity. Proliferation marker Ki67 at 70%. HER2 negative by immunohistochemistry (0).  The biopsied lymph node confirmed metastatic breast carcinoma.  The patient's subsequent history is as detailed below.   PAST MEDICAL HISTORY: Past Medical History:  Diagnosis Date   Arthritis    Cancer ([place]) 11/2019   Left breast   Diverticulitis    Family history of ovarian cancer    Heart murmur    11/26/19 echo: Mild AS. AV mean gradient 12.0 mmHg;  however, LVOT gradient 8 mmHg with intracvitary gradient-significant AS is  not suspected   Hypertension     PAST SURGICAL HISTORY: Past Surgical History:  Procedure Laterality Date   MASTECTOMY WITH AXILLARY LYMPH NODE DISSECTION Left 12/19/2019   Procedure: LEFT MASTECTOMY WITH AXILLARY LYMPH NODE DISSECTION;  Surgeon: [name], MD;  Location: [facility];  Service: General;  Laterality: Left;   MULTIPLE EXTRACTIONS WITH ALVEOLOPLASTY Bilateral 12/14/2019   Procedure: MULTIPLE EXTRACTION WITH ALVEOLOPLASTY;  Surgeon: [name], DDS;  Location: [facility];  Service: Oral Surgery;  Laterality: Bilateral;   PORTACATH PLACEMENT N/A 12/19/2019   Procedure: INSERTION PORT-A-CATH WITH ULTRASOUND GUIDANCE;  Surgeon: [name], MD;  Location: [facility];  Service: General;  Laterality: N/A;   RADIAL HEAD ARTHROPLASTY Right 06/25/2019   Procedure: RIGHT RADIAL HEAD ARTHROPLASTY;  Surgeon: [name], MD;  Location: [facility];  Service: Orthopedics;  Laterality: Right;   TUBAL LIGATION      FAMILY HISTORY: Family History  Problem Relation Age of Onset   Hypertension Mother    Dementia Mother    Ovarian cancer Half-Sister 67   Diabetes Half-Sister    Stroke Half-Sister   Her father died at age 50; she is unsure of the cause of death. Her mother is age 32 as of 10/2019.  The patient has two brothers and four sisters. She reports ovarian cancer in her sister at age 28. She denies a family history of breast, prostate, or pancreatic cancer.   GYNECOLOGIC HISTORY:  No LMP recorded. Patient is postmenopausal. Menarche: 64 years old Age at first live birth: 64 years old [place] 2 LMP around age 64 Contraceptive: used previously HRT never used  Hysterectomy? no BSO? no   SOCIAL HISTORY: (updated 10/2019)  [name] retired from working at the [employer]. She is divorced. She lives at home alone but during the pandemic her sister Manus Gunning and mother [name] lived in the same home. [name], age 71, lives in [place] and is handicapped. [name], age 68, is  a Biomedical scientist here in [place]. [name] has two granddaughters. She attends a local [employer].    ADVANCED DIRECTIVES: not in place; at the 11/21/2019 visit the patient was given the appropriate documents to complete and notarized at her discretion.  She expressed spontaneously that she would not want to be resuscitated in case of a terminal event.    HEALTH MAINTENANCE: Social History   Tobacco Use   Smoking status: Never Smoker   Smokeless tobacco: Never Used  Substance Use Topics   Alcohol use: Yes    Alcohol/week: 3.0 standard drinks    Types: 1 Cans of beer, 2 Shots of liquor per week    Comment: daily   Drug use: Yes    Types: Marijuana     Colonoscopy: never done  PAP: "1980?"  Bone density: never done   Allergies  Allergen Reactions   Amlodipine Swelling    BLE edema    Current Outpatient Medications  Medication Sig Dispense Refill   amoxicillin (AMOXIL) 500 MG capsule Take 1 capsule (500 mg total) by mouth 3 (three) times daily. 21 capsule 0   benazepril (LOTENSIN) 20 MG tablet TAKE 1 TABLET (20 MG TOTAL) BY MOUTH DAILY. TO LOWER BLOOD PRESSURE (Patient taking differently: Take 20 mg by mouth daily. ) 90 tablet 1   Blood Pressure Monitoring ([employer] ADVANCED BP MONITOR) DEVI Measure blood pressure daily and record in diary 1 Device 0   furosemide (LASIX) 40 MG  tablet Take 40 mg by mouth daily.     hydrochlorothiazide (HYDRODIURIL) 25 MG tablet Take 1 tablet (25 mg total) by mouth daily. 90 tablet 1   KLOR-CON M20 20 MEQ tablet TAKE 1 TABLET BY MOUTH EVERY DAY (Patient taking differently: Take 20 mEq by mouth daily. ) 30 tablet 3   meloxicam (MOBIC) 15 MG tablet Take 1 tablet (15 mg total) by mouth daily. 30 tablet 2   metoprolol tartrate (LOPRESSOR) 50 MG tablet Take 1 tablet (50 mg total) by mouth 2 (two) times daily. 60 tablet 3   Multiple Vitamins-Minerals (CENTRUM SILVER ADULT 50+) TABS Take 1 tablet by mouth daily.      Multiple Vitamins-Minerals  (HAIR SKIN AND NAILS FORMULA PO) Take 1 tablet by mouth daily at 2 PM.     ondansetron (ZOFRAN) 4 MG tablet Take 1-2 tablets (4-8 mg total) by mouth every 8 (eight) hours as needed for nausea or vomiting. (Patient not taking: Reported on 09/05/2019) 40 tablet 0   oxyCODONE-acetaminophen (PERCOCET) 5-325 MG tablet Take 1 tablet by mouth every 4 (four) hours as needed. 30 tablet 0   No current facility-administered medications for this visit.    OBJECTIVE: African-American woman examined in a wheelchair  Vitals:   12/25/19 0958  BP: (!) 118/53  Pulse: 76  Resp: 18  Temp: 98.3 F (36.8 C)     Body mass index is 59.58 kg/m.   Wt Readings from Last 3 Encounters:  12/25/19 (!) 315 lb 4.8 oz (143 kg)  12/19/19 (!) 319 lb (144.7 kg)  12/14/19 (!) 318 lb 2 oz (144.3 kg)      ECOG FS:2 - Symptomatic, <50% confined to bed  Sclerae unicteric, EOMs intact Wearing a mask No cervical or supraclavicular adenopathy Lungs no rales or rhonchi Heart regular rate and rhythm Abd soft, obese, nontender, positive bowel sounds MSK no focal spinal tenderness, no upper extremity lymphedema Neuro: nonfocal, well oriented, appropriate affect Breasts: The right breast is unremarkable.  The left breast is status post mastectomy.  The incision is healing very nicely, with no significant distortions or irregularities, no dehiscence, no erythema, and no swelling.  Tube drain still in place, with serosanguineous liquid in the bulb, approximately 15-20 cc per bulb   LAB RESULTS:  CMP     Component Value Date/Time   NA 141 12/25/2019 0919   NA 138 10/17/2019 1541   K 4.4 12/25/2019 0919   CL 103 12/25/2019 0919   CO2 23 12/25/2019 0919   GLUCOSE 100 (H) 12/25/2019 0919   BUN 26 (H) 12/25/2019 0919   BUN 13 10/17/2019 1541   CREATININE 1.26 (H) 12/25/2019 0919   CREATININE 1.59 (H) 11/21/2019 0908   CALCIUM 8.4 (L) 12/25/2019 0919   PROT 7.7 12/25/2019 0919   PROT 8.5 06/15/2019 1622   ALBUMIN 2.8  (L) 12/25/2019 0919   ALBUMIN 4.3 06/15/2019 1622   AST 91 (H) 12/25/2019 0919   AST 61 (H) 11/21/2019 0908   ALT 37 12/25/2019 0919   ALT 32 11/21/2019 0908   ALKPHOS 58 12/25/2019 0919   BILITOT 0.4 12/25/2019 0919   BILITOT 0.5 11/21/2019 0908   GFRNONAA 45 (L) 12/25/2019 0919   GFRNONAA 34 (L) 11/21/2019 0908   GFRAA 52 (L) 12/25/2019 0919   GFRAA 39 (L) 11/21/2019 0908    No results found for: TOTALPROTELP, ALBUMINELP, A1GS, A2GS, BETS, BETA2SER, GAMS, MSPIKE, SPEI  Lab Results  Component Value Date   WBC 7.4 12/25/2019   NEUTROABS 3.5 12/25/2019  HGB 9.6 (L) 12/25/2019   HCT 29.7 (L) 12/25/2019   MCV 101.0 (H) 12/25/2019   PLT 261 12/25/2019    No results found for: LABCA2  No components found for: [id number]  No results for input(s): INR in the last 168 hours.  No results found for: LABCA2  No results found for: JKK938  No results found for: HWE993  No results found for: ZJI967  No results found for: CA2729  No components found for: HGQUANT  No results found for: CEA1 / No results found for: CEA1   No results found for: AFPTUMOR  No results found for: CHROMOGRNA  No results found for: KPAFRELGTCHN, LAMBDASER, KAPLAMBRATIO (kappa/lambda light chains)  No results found for: HGBA, HGBA2QUANT, HGBFQUANT, HGBSQUAN (Hemoglobinopathy evaluation)   No results found for: LDH  Lab Results  Component Value Date   IRON 56 11/10/2007   (Iron and TIBC)  Lab Results  Component Value Date   FERRITIN 3000 (H) 11/10/2007    Urinalysis    Component Value Date/Time   COLORURINE AMBER (A) 11/03/2018 1627   APPEARANCEUR HAZY (A) 11/03/2018 1627   LABSPEC 1.025 11/03/2018 1627   PHURINE 5.0 11/03/2018 1627   GLUCOSEU NEGATIVE 11/03/2018 1627   HGBUR NEGATIVE 11/03/2018 1627   BILIRUBINUR MODERATE (A) 11/03/2018 1627   KETONESUR 20 (A) 11/03/2018 1627   PROTEINUR 30 (A) 11/03/2018 1627   UROBILINOGEN 1.0 07/14/2012 1630   NITRITE NEGATIVE 11/03/2018  1627   LEUKOCYTESUR NEGATIVE 11/03/2018 1627     STUDIES: MR BREAST BILATERAL W WO CONTRAST INC CAD  Result Date: 12/07/2019 CLINICAL DATA:  Recent biopsy-proven invasive ductal carcinoma upper outer quadrant left breast with metastatic left axillary lymph node. LABS:  None. EXAM: BILATERAL BREAST MRI WITH AND WITHOUT CONTRAST TECHNIQUE: Multiplanar, multisequence MR images of both breasts were obtained prior to and following the intravenous administration of 10 ml of Gadavist Three-dimensional MR images were rendered by post-processing of the original MR data on an independent workstation. The three-dimensional MR images were interpreted, and findings are reported in the following complete MRI report for this study. Three dimensional images were evaluated at the independent DynaCad workstation COMPARISON:  Previous exams. FINDINGS: Breast composition: b. Scattered fibroglandular tissue. Background parenchymal enhancement: Minimal Right breast: No mass or abnormal enhancement. Left breast: Examination demonstrates patient's biopsy-proven malignancy over the posterior third of the upper-outer quadrant with clip artifact. This mass is fairly superficial and measures approximately 3.4 x 4.9 x 4.6 cm in transverse, AP and craniocaudal dimensions. There is deviation of the left nipple laterally and inferiorly. Remainder of the left breast is unremarkable. Lymph nodes: Several enlarged left axillary lymph nodes with the largest most inferior node containing clip artifact compatible with known metastatic lymph node. This node measures 2.1 x 2.8 cm cm. There are 4-5 other suspicious left axillary lymph nodes as seen on ultrasound. Ancillary findings:  None. IMPRESSION: 1. 4.9 cm biopsy-proven malignancy over the posterior left upper outer quadrant. No evidence of multifocal or multicentric disease. 2. Left axillary adenopathy as described compatible with known metastatic disease. 3.  No suspicious findings within the  right breast. RECOMMENDATION: Recommend continued management per clinical treatment plan. BI-RADS CATEGORY  6: Known biopsy-proven malignancy. Electronically Signed   By: Marin Olp M.D.   On: 12/07/2019 14:01   DG CHEST PORT 1 VIEW  Result Date: 12/19/2019 CLINICAL DATA:  Left breast cancer, chest wall catheter placement EXAM: PORTABLE CHEST 1 VIEW COMPARISON:  10/05/2006 FINDINGS: Single frontal view of the chest demonstrates  right chest wall port via subclavian approach tip overlying superior vena cava. Postsurgical changes are seen within the left breast, with surgical drain in place. Cardiac silhouette is unremarkable. Lung volumes are diminished, without airspace disease, effusion, or pneumothorax. IMPRESSION: 1. No complication after right chest wall port placement. 2. Postsurgical changes left breast. 3. Low lung volumes. Electronically Signed   By: Randa Ngo M.D.   On: 12/19/2019 15:57   DG Fluoro Guide CV Line-No Report  Result Date: 12/19/2019 Fluoroscopy was utilized by the requesting physician.  No radiographic interpretation.   ECHOCARDIOGRAM COMPLETE  Result Date: 11/26/2019    ECHOCARDIOGRAM REPORT   Patient Name:   [name] Date of Exam: 11/26/2019 Medical Rec #:  [id number]     Height:       61.0 in Accession #:    [id number]    Weight:       319.3 lb Date of Birth:  December 21, 1955    BSA:          2.304 m Patient Age:    64 years      BP:           146/89 mmHg Patient Gender: F             HR:           65 bpm. Exam Location:  Outpatient Procedure: 3D Echo, 2D Echo, Cardiac Doppler, Color Doppler and Strain Analysis Indications:    Z51.11 Encounter for antineoplastic chemotheraphy  History:        Patient has prior history of Echocardiogram examinations. Risk                 Factors:Hypertension. ETOH. Edema. Breast cancer.  Sonographer:    Roseanna Rainbow RDCS Referring Phys: [phone] Chauncey Cruel  Sonographer Comments: Technically difficult study due to poor echo windows, patient  is morbidly obese and no subcostal window. Image acquisition challenging due to patient body habitus. IMPRESSIONS  1. Left ventricular ejection fraction, by estimation, is 65 to 70%. The left ventricle has normal function. The left ventricle has no regional wall motion abnormalities. There is mild left ventricular hypertrophy. Left ventricular diastolic parameters are consistent with Grade II diastolic dysfunction (pseudonormalization). Elevated left ventricular end-diastolic pressure. The average left ventricular global longitudinal strain is -24.0 %.  2. Right ventricular systolic function is normal. The right ventricular size is normal.  3. The aortic valve is tricuspid. Aortic valve regurgitation is trivial. Mild aortic valve stenosis. Aortic valve mean gradient measures 12.0 mmHg, however, the LVOT gradient was 8 mmHg and there is an intracavitary gradient - significant aortic stenosis is not suspected.  4. The mitral valve is abnormal. Trivial mitral valve regurgitation. FINDINGS  Left Ventricle: Left ventricular ejection fraction, by estimation, is 65 to 70%. The left ventricle has normal function. The left ventricle has no regional wall motion abnormalities. The average left ventricular global longitudinal strain is -24.0 %. The left ventricular internal cavity size was normal in size. There is mild left ventricular hypertrophy. Left ventricular diastolic parameters are consistent with Grade II diastolic dysfunction (pseudonormalization). Elevated left ventricular end-diastolic pressure. Right Ventricle: The right ventricular size is normal. No increase in right ventricular wall thickness. Right ventricular systolic function is normal. Left Atrium: Left atrial size was normal in size. Right Atrium: Right atrial size was normal in size. Pericardium: There is no evidence of pericardial effusion. Mitral Valve: The mitral valve is abnormal. There is mild thickening of the mitral valve leaflet(s). Trivial mitral  valve regurgitation. MV peak gradient, 19.4 mmHg. The mean mitral valve gradient is 5.5 mmHg. Tricuspid Valve: The tricuspid valve is grossly normal. Tricuspid valve regurgitation is trivial. Aortic Valve: The aortic valve is tricuspid. Aortic valve regurgitation is trivial. Mild aortic stenosis is present. Aortic valve mean gradient measures 12.0 mmHg. Aortic valve peak gradient measures 25.6 mmHg. Aortic valve area, by VTI measures 1.18 cm. Pulmonic Valve: The pulmonic valve was grossly normal. Pulmonic valve regurgitation is trivial. Aorta: The aortic root and ascending aorta are structurally normal, with no evidence of dilitation. Venous: The inferior vena cava was not well visualized. IAS/Shunts: The interatrial septum was not well visualized.  LEFT VENTRICLE PLAX 2D LVIDd:         4.00 cm     Diastology LVIDs:         2.00 cm     LV e' lateral:   9.03 cm/s LV PW:         1.20 cm     LV E/e' lateral: 19.5 LV IVS:        1.20 cm     LV e' medial:    10.20 cm/s LVOT diam:     1.50 cm     LV E/e' medial:  17.3 LV SV:         64 LV SV Index:   28          2D Longitudinal Strain LVOT Area:     1.77 cm    2D Strain GLS Avg:     -24.0 %  LV Volumes (MOD) LV vol d, MOD A4C: 59.9 ml LV vol s, MOD A4C: 21.0 ml LV SV MOD A4C:     59.9 ml RIGHT VENTRICLE RV S prime:     13.30 cm/s TAPSE (M-mode): 3.1 cm LEFT ATRIUM             Index       RIGHT ATRIUM           Index LA diam:        3.60 cm 1.56 cm/m  RA Area:     19.70 cm LA Vol (A2C):   49.9 ml 21.66 ml/m RA Volume:   57.60 ml  25.00 ml/m LA Vol (A4C):   41.6 ml 18.05 ml/m LA Biplane Vol: 46.3 ml 20.09 ml/m  AORTIC VALVE AV Area (Vmax):    1.19 cm AV Area (Vmean):   1.50 cm AV Area (VTI):     1.18 cm AV Vmax:           253.00 cm/s AV Vmean:          158.000 cm/s AV VTI:            0.540 m AV Peak Grad:      25.6 mmHg AV Mean Grad:      12.0 mmHg LVOT Vmax:         171.00 cm/s LVOT Vmean:        134.000 cm/s LVOT VTI:          0.361 m LVOT/AV VTI ratio: 0.67   AORTA Ao Root diam: 2.70 cm Ao Asc diam:  3.40 cm MITRAL VALVE MV Area (PHT): 3.03 cm     SHUNTS MV Peak grad:  19.4 mmHg    Systemic VTI:  0.36 m MV Mean grad:  5.5 mmHg     Systemic Diam: 1.50 cm MV Vmax:       2.20 m/s MV Vmean:      96.0 cm/s  MV Decel Time: 250 msec MV E velocity: 176.00 cm/s MV A velocity: 117.00 cm/s MV E/A ratio:  1.50 Lyman Bishop MD Electronically signed by Lyman Bishop MD Signature Date/Time: 11/26/2019/11:11:29 AM    Final      ELIGIBLE FOR AVAILABLE RESEARCH PROTOCOL: AET  ASSESSMENT: 64 y.o. [place] woman status post left breast upper outer quadrant and left axillary lymph node biopsies 11/12/2019 for a clinical T2 N2, stage IIIA invasive ductal carcinoma, grade 3, estrogen and progesterone receptor positive, HER-2 not amplified, with an MIB-1 of 70%  (a) the biopsied axillary lymph node was positive  (1) genetics testing 11/29/2019 through the Breast Cancer STAT Panel offered by [facility] found no deleterious mutations in ATM, BRCA1, BRCA2, CDH1, CHEK2, PALB2, PTEN, STK11 and TP53.  Additional testing through the Common Hereditary Cancers Panel confirmed no deleterious mutations in APC, ATM, AXIN2, BARD1, BMPR1A, BRCA1, BRCA2, BRIP1, CDH1, CDK4, CDKN2A (p14ARF), CDKN2A (p16INK4a), CHEK2, CTNNA1, DICER1, EPCAM (Deletion/duplication testing only), GREM1 (promoter region deletion/duplication testing only), KIT, MEN1, MLH1, MSH2, MSH3, MSH6, MUTYH, NBN, NF1, NHTL1, PALB2, PDGFRA, PMS2, POLD1, POLE, PTEN, RAD50, RAD51C, RAD51D, RNF43, SDHB, SDHC, SDHD, SMAD4, SMARCA4. STK11, TP53, TSC1, TSC2, and VHL.  The following genes were evaluated for sequence changes only: SDHA and HOXB13 c.251G>A variant only.  (2) status post left modified radical mastectomy 12/19/2019 for a pT2 pN1, stage IIB invasive ductal carcinoma, grade 3, with negative margins.  (a) 1 of 13 axillary lymph nodes removed was positive  (3) adjuvant chemotherapy will consist of cyclophosphamide and doxorubicin  in dose dense fashion x4 starting 01/22/2020 followed by weekly paclitaxel x12  (a) baseline echocardiogram 11/26/2019 shows an ejection fraction in the 65-70% range.  (4) adjuvant radiation as appropriate  (5) antiestrogens to follow    PLAN:  Jennier did very well with her surgery, with no significant pain or bleeding and the drainage into the 2 Jackson-Pratt's has been decreasing significantly.  It seems likely that by next week she can have her drains removed.  She understands she has aggressive rapidly growing stage II breast cancer and we are going to proceed with adjuvant chemotherapy which will consist of cyclophosphamide and doxorubicin in dose dense fashion x4 followed by weekly paclitaxel x12.  We have a baseline echocardiogram which is very favorable.  In estrogen receptor positive cases there is no big rush to start chemotherapy and I would like her to be completely recovered so the start treatment date will be 01/22/2020.  Before that she will see our chemotherapy teaching nurse and she will also return to see me that same day to confirm that everything is in place.  We are pretty much going to see her on a weekly basis for the first 10 weeks and she tells me transportation with her niece is not expected to be a problem.  She has a good understanding of the possible toxicities side effects and complications of the agents involved.  Total encounter time 45 minutes.Sarajane Jews C. Greysyn Vanderberg, MD 12/25/2019 10:17 AM Medical Oncology and Hematology [facility] [place], [place] [address] Tel. [phone]    Fax. [phone]   This document serves as a record of services personally performed by Lurline Del, MD. It was created on his behalf by Wilburn Mylar, a trained medical scribe. The creation of this record is based on the scribe's personal observations and the provider's statements to them.   Lindie Spruce MD, have reviewed the above  documentation for accuracy  and completeness, and I agree with the above.   *Total Encounter Time as defined by the Centers for Medicare and Medicaid Services includes, in addition to the face-to-face time of a patient visit (documented in the note above) non-face-to-face time: obtaining and reviewing outside history, ordering and reviewing medications, tests or procedures, care coordination (communications with other health care professionals or caregivers) and documentation in the medical record.

## 2019-12-25 ENCOUNTER — Inpatient Hospital Stay: Payer: Medicaid Other

## 2019-12-25 ENCOUNTER — Other Ambulatory Visit: Payer: Self-pay

## 2019-12-25 ENCOUNTER — Telehealth: Payer: Self-pay | Admitting: Oncology

## 2019-12-25 ENCOUNTER — Inpatient Hospital Stay: Payer: Medicaid Other | Attending: Oncology | Admitting: Oncology

## 2019-12-25 VITALS — BP 118/53 | HR 76 | Temp 98.3°F | Resp 18 | Ht 61.0 in | Wt 315.3 lb

## 2019-12-25 DIAGNOSIS — Z833 Family history of diabetes mellitus: Secondary | ICD-10-CM | POA: Diagnosis not present

## 2019-12-25 DIAGNOSIS — C50812 Malignant neoplasm of overlapping sites of left female breast: Secondary | ICD-10-CM | POA: Diagnosis present

## 2019-12-25 DIAGNOSIS — Z888 Allergy status to other drugs, medicaments and biological substances status: Secondary | ICD-10-CM | POA: Insufficient documentation

## 2019-12-25 DIAGNOSIS — Z8249 Family history of ischemic heart disease and other diseases of the circulatory system: Secondary | ICD-10-CM | POA: Diagnosis not present

## 2019-12-25 DIAGNOSIS — Z6841 Body Mass Index (BMI) 40.0 and over, adult: Secondary | ICD-10-CM

## 2019-12-25 DIAGNOSIS — Z818 Family history of other mental and behavioral disorders: Secondary | ICD-10-CM | POA: Insufficient documentation

## 2019-12-25 DIAGNOSIS — Z8041 Family history of malignant neoplasm of ovary: Secondary | ICD-10-CM | POA: Diagnosis not present

## 2019-12-25 DIAGNOSIS — Z170 Estrogen receptor positive status [ER+]: Secondary | ICD-10-CM

## 2019-12-25 DIAGNOSIS — R590 Localized enlarged lymph nodes: Secondary | ICD-10-CM | POA: Insufficient documentation

## 2019-12-25 DIAGNOSIS — Z79899 Other long term (current) drug therapy: Secondary | ICD-10-CM | POA: Insufficient documentation

## 2019-12-25 DIAGNOSIS — Z823 Family history of stroke: Secondary | ICD-10-CM | POA: Insufficient documentation

## 2019-12-25 DIAGNOSIS — I10 Essential (primary) hypertension: Secondary | ICD-10-CM | POA: Insufficient documentation

## 2019-12-25 DIAGNOSIS — C50412 Malignant neoplasm of upper-outer quadrant of left female breast: Secondary | ICD-10-CM

## 2019-12-25 DIAGNOSIS — M1990 Unspecified osteoarthritis, unspecified site: Secondary | ICD-10-CM | POA: Diagnosis not present

## 2019-12-25 DIAGNOSIS — I082 Rheumatic disorders of both aortic and tricuspid valves: Secondary | ICD-10-CM | POA: Insufficient documentation

## 2019-12-25 LAB — CBC WITH DIFFERENTIAL/PLATELET
Abs Immature Granulocytes: 0.12 10*3/uL — ABNORMAL HIGH (ref 0.00–0.07)
Basophils Absolute: 0.1 10*3/uL (ref 0.0–0.1)
Basophils Relative: 1 %
Eosinophils Absolute: 0.2 10*3/uL (ref 0.0–0.5)
Eosinophils Relative: 2 %
HCT: 29.7 % — ABNORMAL LOW (ref 36.0–46.0)
Hemoglobin: 9.6 g/dL — ABNORMAL LOW (ref 12.0–15.0)
Immature Granulocytes: 2 %
Lymphocytes Relative: 39 %
Lymphs Abs: 2.9 10*3/uL (ref 0.7–4.0)
MCH: 32.7 pg (ref 26.0–34.0)
MCHC: 32.3 g/dL (ref 30.0–36.0)
MCV: 101 fL — ABNORMAL HIGH (ref 80.0–100.0)
Monocytes Absolute: 0.7 10*3/uL (ref 0.1–1.0)
Monocytes Relative: 10 %
Neutro Abs: 3.5 10*3/uL (ref 1.7–7.7)
Neutrophils Relative %: 46 %
Platelets: 261 10*3/uL (ref 150–400)
RBC: 2.94 MIL/uL — ABNORMAL LOW (ref 3.87–5.11)
RDW: 13.4 % (ref 11.5–15.5)
WBC: 7.4 10*3/uL (ref 4.0–10.5)
nRBC: 0 % (ref 0.0–0.2)

## 2019-12-25 LAB — COMPREHENSIVE METABOLIC PANEL
ALT: 37 U/L (ref 0–44)
AST: 91 U/L — ABNORMAL HIGH (ref 15–41)
Albumin: 2.8 g/dL — ABNORMAL LOW (ref 3.5–5.0)
Alkaline Phosphatase: 58 U/L (ref 38–126)
Anion gap: 15 (ref 5–15)
BUN: 26 mg/dL — ABNORMAL HIGH (ref 8–23)
CO2: 23 mmol/L (ref 22–32)
Calcium: 8.4 mg/dL — ABNORMAL LOW (ref 8.9–10.3)
Chloride: 103 mmol/L (ref 98–111)
Creatinine, Ser: 1.26 mg/dL — ABNORMAL HIGH (ref 0.44–1.00)
GFR calc Af Amer: 52 mL/min — ABNORMAL LOW (ref >60)
GFR calc non Af Amer: 45 mL/min — ABNORMAL LOW (ref >60)
Glucose, Bld: 100 mg/dL — ABNORMAL HIGH (ref 70–99)
Potassium: 4.4 mmol/L (ref 3.5–5.1)
Sodium: 141 mmol/L (ref 135–145)
Total Bilirubin: 0.4 mg/dL (ref 0.3–1.2)
Total Protein: 7.7 g/dL (ref 6.5–8.1)

## 2019-12-25 NOTE — Progress Notes (Signed)
START ON PATHWAY REGIMEN - Breast     Cycles 1 through 4: A cycle is every 14 days:     Doxorubicin      Cyclophosphamide      Pegfilgrastim-xxxx    Cycles 5 through 16: A cycle is every 7 days:     Paclitaxel   **Always confirm dose/schedule in your pharmacy ordering system**  Patient Characteristics: Postoperative without Neoadjuvant Therapy (Pathologic Staging), Invasive Disease, Adjuvant Therapy, HER2 Negative/Unknown/Equivocal, ER Positive, Node Positive, Node Positive (1-3), Genomic Testing Not Performed, Chemotherapy Candidate Therapeutic Status: Postoperative without Neoadjuvant Therapy (Pathologic Staging) AJCC Grade: G3 AJCC N Category: pN1a AJCC M Category: cM0 ER Status: Positive (+) AJCC 8 Stage Grouping: IIA HER2 Status: Negative (-) Oncotype Dx Recurrence Score: Not Appropriate AJCC T Category: pT2 PR Status: Positive (+) Has this patient completed genomic testing<= No - Did Not Order Test  Intent of Therapy: Curative Intent, Discussed with Patient

## 2019-12-25 NOTE — Telephone Encounter (Signed)
Scheduled appts per 4/27 los. Pt confirmed appt dates and time. Mailed appt reminder and calendar per pt request.

## 2019-12-26 ENCOUNTER — Encounter: Payer: Self-pay | Admitting: *Deleted

## 2019-12-28 NOTE — Anesthesia Postprocedure Evaluation (Signed)
Anesthesia Post Note  Patient: [name]  Procedure(s) Performed: MULTIPLE EXTRACTION WITH ALVEOLOPLASTY (Bilateral )     Patient location during evaluation: PACU Anesthesia Type: General Level of consciousness: awake and alert Pain management: pain level controlled Vital Signs Assessment: post-procedure vital signs reviewed and stable Respiratory status: spontaneous breathing, nonlabored ventilation, respiratory function stable and patient connected to nasal cannula oxygen Cardiovascular status: blood pressure returned to baseline and stable Postop Assessment: no apparent nausea or vomiting Anesthetic complications: no    Last Vitals:  Vitals:   12/14/19 1100 12/14/19 1115  BP: (!) 154/63 (!) 149/62  Pulse: (!) 113 (!) 110  Resp: (!) 21 (!) 22  Temp:  36.8 C  SpO2: 95% 95%    Last Pain:  Vitals:   12/14/19 1115  TempSrc:   PainSc: 0-No pain                 Morganna Styles COKER

## 2019-12-28 NOTE — Anesthesia Preprocedure Evaluation (Signed)
Anesthesia Evaluation  Patient identified by MRN, date of birth, ID band Patient awake    Reviewed: Allergy & Precautions, NPO status , Patient's Chart, lab work & pertinent test results  Airway Mallampati: II   Neck ROM: Full    Dental  (+) Poor Dentition   Pulmonary    breath sounds clear to auscultation       Cardiovascular hypertension,  Rhythm:Regular Rate:Normal     Neuro/Psych    GI/Hepatic   Endo/Other    Renal/GU      Musculoskeletal   Abdominal   Peds  Hematology   Anesthesia Other Findings   Reproductive/Obstetrics                             Anesthesia Physical Anesthesia Plan  ASA: III  Anesthesia Plan: General   Post-op Pain Management:    Induction: Intravenous  PONV Risk Score and Plan: Ondansetron and Dexamethasone  Airway Management Planned: Nasal ETT  Additional Equipment:   Intra-op Plan:   Post-operative Plan: Extubation in OR  Informed Consent: I have reviewed the patients History and Physical, chart, labs and discussed the procedure including the risks, benefits and alternatives for the proposed anesthesia with the patient or authorized representative who has indicated his/her understanding and acceptance.       Plan Discussed with: Anesthesiologist and CRNA  Anesthesia Plan Comments:         Anesthesia Quick Evaluation

## 2020-01-02 ENCOUNTER — Ambulatory Visit: Payer: Medicaid Other | Attending: Family Medicine | Admitting: Family Medicine

## 2020-01-02 ENCOUNTER — Encounter: Payer: Self-pay | Admitting: Family Medicine

## 2020-01-02 ENCOUNTER — Other Ambulatory Visit: Payer: Self-pay

## 2020-01-02 VITALS — BP 149/76 | HR 133 | Temp 97.7°F | Ht 61.0 in | Wt 308.6 lb

## 2020-01-02 DIAGNOSIS — Z9012 Acquired absence of left breast and nipple: Secondary | ICD-10-CM

## 2020-01-02 DIAGNOSIS — R000 Tachycardia, unspecified: Secondary | ICD-10-CM | POA: Diagnosis not present

## 2020-01-02 DIAGNOSIS — L03116 Cellulitis of left lower limb: Secondary | ICD-10-CM

## 2020-01-02 DIAGNOSIS — Z79899 Other long term (current) drug therapy: Secondary | ICD-10-CM | POA: Insufficient documentation

## 2020-01-02 DIAGNOSIS — I10 Essential (primary) hypertension: Secondary | ICD-10-CM | POA: Diagnosis not present

## 2020-01-02 DIAGNOSIS — Z7901 Long term (current) use of anticoagulants: Secondary | ICD-10-CM | POA: Insufficient documentation

## 2020-01-02 DIAGNOSIS — Z6841 Body Mass Index (BMI) 40.0 and over, adult: Secondary | ICD-10-CM | POA: Diagnosis not present

## 2020-01-02 DIAGNOSIS — C50412 Malignant neoplasm of upper-outer quadrant of left female breast: Secondary | ICD-10-CM

## 2020-01-02 DIAGNOSIS — I872 Venous insufficiency (chronic) (peripheral): Secondary | ICD-10-CM | POA: Diagnosis not present

## 2020-01-02 DIAGNOSIS — Z170 Estrogen receptor positive status [ER+]: Secondary | ICD-10-CM | POA: Diagnosis not present

## 2020-01-02 DIAGNOSIS — L03115 Cellulitis of right lower limb: Secondary | ICD-10-CM | POA: Diagnosis not present

## 2020-01-02 DIAGNOSIS — Z791 Long term (current) use of non-steroidal anti-inflammatories (NSAID): Secondary | ICD-10-CM | POA: Diagnosis not present

## 2020-01-02 DIAGNOSIS — M1990 Unspecified osteoarthritis, unspecified site: Secondary | ICD-10-CM | POA: Diagnosis not present

## 2020-01-02 DIAGNOSIS — S81802A Unspecified open wound, left lower leg, initial encounter: Secondary | ICD-10-CM

## 2020-01-02 MED ORDER — CEFTRIAXONE SODIUM 1 G IJ SOLR
1.0000 g | Freq: Once | INTRAMUSCULAR | Status: AC
  Administered 2020-01-02: 1 g via INTRAMUSCULAR

## 2020-01-02 MED ORDER — IBUPROFEN 600 MG PO TABS: 600.0000 mg | ORAL_TABLET | Freq: Three times a day (TID) | ORAL | 0 refills | Status: DC | PRN

## 2020-01-02 MED ORDER — AMOXICILLIN-POT CLAVULANATE 500-125 MG PO TABS: 1.0000 | ORAL_TABLET | Freq: Two times a day (BID) | ORAL | 0 refills | Status: DC

## 2020-01-02 MED ORDER — HYDROCODONE-ACETAMINOPHEN 5-300 MG PO TABS: ORAL_TABLET | ORAL | 0 refills | Status: DC

## 2020-01-02 NOTE — Progress Notes (Signed)
Established Patient Office Visit  Subjective:  Patient ID: [name], female    DOB: [birth year]  Age: 64 y.o. MRN: [id number]  CC: sore on the back of her left lower leg that is draining and she is worried that it might be cancer  HPI [name], 64 year old African-American female, who is status post recent left mastectomy and insertion of Port-A-Cath on 12/19/2019 due to recently diagnosed left breast cancer.  She reports that she is doing okay status post surgery.  She is worried about her health as she is the primary caregiver for her parents who also have chronic illnesses.  Patient's mother has dementia.  Patient does have narcotic pain medication but was able to take nonnarcotic pain medication over the past 2 days with adequate pain relief.  She continues to have the presence of 2 drains at the postsurgical site.          She reports that a few days ago she noticed some drainage from an area on the back of her left lower leg that she cannot really see due to the location.  She has had some mild increased pain at the area of the open wound.  Pain is about a 6 on a 0-to-10 scale currently.  She denies any fever or chills.  She states that after she noticed the abnormal drainage and felt the wound area that she was concerned that the wound might be cancer.  Drainage has been mostly clear but smells bad.  She has had chronic issues with swelling of both legs.  She reports that she has been taking her furosemide and potassium as well as her hydrochlorothiazide, benazepril and metoprolol for her blood pressure.  She denies any headaches or dizziness related to her blood pressure.   Past Medical History:  Diagnosis Date  . Arthritis   . Cancer ([place]) 11/2019   Left breast  . Diverticulitis   . Family history of ovarian cancer   . Heart murmur    11/26/19 echo: Mild AS. AV mean gradient 12.0 mmHg; however, LVOT gradient 8 mmHg with intracvitary gradient-significant AS is not suspected  .  Hypertension     Past Surgical History:  Procedure Laterality Date  . MASTECTOMY WITH AXILLARY LYMPH NODE DISSECTION Left 12/19/2019   Procedure: LEFT MASTECTOMY WITH AXILLARY LYMPH NODE DISSECTION;  Surgeon: [name], MD;  Location: [place];  Service: General;  Laterality: Left;  Marland Kitchen MULTIPLE EXTRACTIONS WITH ALVEOLOPLASTY Bilateral 12/14/2019   Procedure: MULTIPLE EXTRACTION WITH ALVEOLOPLASTY;  Surgeon: [name], DDS;  Location: [place];  Service: Oral Surgery;  Laterality: Bilateral;  . PORTACATH PLACEMENT N/A 12/19/2019   Procedure: INSERTION PORT-A-CATH WITH ULTRASOUND GUIDANCE;  Surgeon: [name], MD;  Location: [place];  Service: General;  Laterality: N/A;  . RADIAL HEAD ARTHROPLASTY Right 06/25/2019   Procedure: RIGHT RADIAL HEAD ARTHROPLASTY;  Surgeon: [name], MD;  Location: [place];  Service: Orthopedics;  Laterality: Right;  . TUBAL LIGATION      Family History  Problem Relation Age of Onset  . Hypertension Mother   . Dementia Mother   . Ovarian cancer Half-Sister 73  . Diabetes Half-Sister   . Stroke Half-Sister     Social History   Socioeconomic History  . Marital status: Legally Separated    Spouse name: Not on file  . Number of children: Not on file  . Years of education: Not on file  . Highest education level: Not on file  Occupational History  . Not  on file  Tobacco Use  . Smoking status: Never Smoker  . Smokeless tobacco: Never Used  Substance and Sexual Activity  . Alcohol use: Yes    Alcohol/week: 3.0 standard drinks    Types: 1 Cans of beer, 2 Shots of liquor per week    Comment: daily  . Drug use: Yes    Types: Marijuana  . Sexual activity: Not Currently  Other Topics Concern  . Not on file  Social History Narrative  . Not on file   Social Determinants of Health   Financial Resource Strain:   . Difficulty of Paying Living Expenses:   Food Insecurity:   . Worried About Charity fundraiser in the Last Year:   . Academic librarian in the Last Year:   Transportation Needs:   . Film/video editor (Medical):   Marland Kitchen Lack of Transportation (Non-Medical):   Physical Activity:   . Days of Exercise per Week:   . Minutes of Exercise per Session:   Stress:   . Feeling of Stress :   Social Connections:   . Frequency of Communication with Friends and Family:   . Frequency of Social Gatherings with Friends and Family:   . Attends Religious Services:   . Active Member of Clubs or Organizations:   . Attends Archivist Meetings:   Marland Kitchen Marital Status:   Intimate Partner Violence:   . Fear of Current or Ex-Partner:   . Emotionally Abused:   Marland Kitchen Physically Abused:   . Sexually Abused:     Outpatient Medications Prior to Visit  Medication Sig Dispense Refill  . benazepril (LOTENSIN) 20 MG tablet TAKE 1 TABLET (20 MG TOTAL) BY MOUTH DAILY. TO LOWER BLOOD PRESSURE (Patient taking differently: Take 20 mg by mouth daily. ) 90 tablet 1  . Blood Pressure Monitoring ([employer] ADVANCED BP MONITOR) DEVI Measure blood pressure daily and record in diary 1 Device 0  . furosemide (LASIX) 40 MG tablet Take 40 mg by mouth daily.    . hydrochlorothiazide (HYDRODIURIL) 25 MG tablet Take 1 tablet (25 mg total) by mouth daily. 90 tablet 1  . KLOR-CON M20 20 MEQ tablet TAKE 1 TABLET BY MOUTH EVERY DAY (Patient taking differently: Take 20 mEq by mouth daily. ) 30 tablet 3  . meloxicam (MOBIC) 15 MG tablet Take 1 tablet (15 mg total) by mouth daily. 30 tablet 2  . metoprolol tartrate (LOPRESSOR) 50 MG tablet Take 1 tablet (50 mg total) by mouth 2 (two) times daily. 60 tablet 3  . Multiple Vitamins-Minerals (CENTRUM SILVER ADULT 50+) TABS Take 1 tablet by mouth daily.     . Multiple Vitamins-Minerals (HAIR SKIN AND NAILS FORMULA PO) Take 1 tablet by mouth daily at 2 PM.    . amoxicillin (AMOXIL) 500 MG capsule Take 1 capsule (500 mg total) by mouth 3 (three) times daily. (Patient not taking: Reported on 01/02/2020) 21 capsule 0  . ondansetron  (ZOFRAN) 4 MG tablet Take 1-2 tablets (4-8 mg total) by mouth every 8 (eight) hours as needed for nausea or vomiting. (Patient not taking: Reported on 09/05/2019) 40 tablet 0  . oxyCODONE-acetaminophen (PERCOCET) 5-325 MG tablet Take 1 tablet by mouth every 4 (four) hours as needed. (Patient not taking: Reported on 01/02/2020) 30 tablet 0   No facility-administered medications prior to visit.    Allergies  Allergen Reactions  . Amlodipine Swelling    BLE edema    ROS Review of Systems  Constitutional: Positive for fatigue.  Negative for chills and fever.  HENT: Negative for sore throat and trouble swallowing.   Eyes: Positive for photophobia. Negative for visual disturbance.  Respiratory: Negative for cough and shortness of breath.   Cardiovascular: Positive for leg swelling. Negative for chest pain and palpitations.  Gastrointestinal: Positive for nausea (Mild after surgery). Negative for abdominal pain, blood in stool, constipation and diarrhea.  Endocrine: Negative for polydipsia, polyphagia and polyuria.  Genitourinary: Negative for dysuria and frequency.  Musculoskeletal: Positive for arthralgias and back pain.       Some left chest wall pain status post mastectomy  Neurological: Negative for dizziness and headaches.  Hematological: Negative for adenopathy. Does not bruise/bleed easily.      Objective:    Physical Exam  Constitutional: She is oriented to person, place, and time. She appears well-developed and well-nourished. No distress.  WNWD obese female in NAD sitting on exam chair wearing a mask as per office COVID-19 protocol.   Cardiovascular: Normal rate and regular rhythm.  Pulmonary/Chest: Effort normal and breath sounds normal.  S/p left mastectomy with bandaging to the wound site and patient with 2 drains in place with serosanguineous drainage  Abdominal: Soft. There is no abdominal tenderness. There is no rebound and no guarding.  Musculoskeletal:        General:  Tenderness and edema present.     Comments: Bilateral lower extremity edema with chronic hypertrophic skin changes.  Tenderness left posterior calf at area of skin wound with drainage as well as increased warmth but no tenderness with squeezing of the calves bilaterally  Neurological: She is alert and oriented to person, place, and time.  Skin: Skin is warm and dry.  Patient with an open, weeping wound on the left lower calf and another area above with abrasion versus pressure ulcer with skin breakdown but no open wound; drainage is clear and slightly thick with an unpleasant odor  Psychiatric: She has a normal mood and affect. Her behavior is normal.  Nursing note and vitals reviewed.   BP (!) 149/76   Pulse (!) 133   Temp 97.7 F (36.5 C) (Temporal)   Ht 5\' 1"  (1.549 m)   Wt (!) 308 lb 9.6 oz (140 kg)   SpO2 100%   BMI 58.31 kg/m  Wt Readings from Last 3 Encounters:  01/02/20 (!) 308 lb 9.6 oz (140 kg)  12/25/19 (!) 315 lb 4.8 oz (143 kg)  12/19/19 (!) 319 lb (144.7 kg)     Health Maintenance Due  Topic Date Due  . COLONOSCOPY  Never done  . PAP SMEAR-Modifier  01/26/2009     Lab Results  Component Value Date   TSH 3.080 01/02/2020   Lab Results  Component Value Date   WBC 9.2 01/02/2020   HGB 9.6 (L) 01/02/2020   HCT 29.2 (L) 01/02/2020   MCV 97 01/02/2020   PLT 268 01/02/2020   Lab Results  Component Value Date   NA 135 01/02/2020   K 4.2 01/02/2020   CO2 21 01/02/2020   GLUCOSE 116 (H) 01/02/2020   BUN 25 01/02/2020   CREATININE 1.48 (H) 01/02/2020   BILITOT 0.4 12/25/2019   ALKPHOS 58 12/25/2019   AST 91 (H) 12/25/2019   ALT 37 12/25/2019   PROT 7.7 12/25/2019   ALBUMIN 2.8 (L) 12/25/2019   CALCIUM 8.8 01/02/2020   ANIONGAP 15 12/25/2019   Lab Results  Component Value Date   CHOL 232 (H) 05/17/2008   Lab Results  Component Value Date  HDL 88 05/17/2008   Lab Results  Component Value Date   LDLCALC 133 (H) 05/17/2008   Lab Results    Component Value Date   TRIG 54 05/17/2008   Lab Results  Component Value Date   CHOLHDL 2.6 Ratio 05/17/2008   No results found for: HGBA1C    Assessment & Plan:  1. Open wound of left lower leg, initial encounter Patient with an open wound of the left lower leg below the calf.  She will have urgent referral to wound care.  CBC to look for elevated white blood cell count.  She received Rocephin 1 g IM x1 here in the office and will be placed on Augmentin 500 mg twice daily x10 days to help with infection.  She reports a fear of becoming addicted to pain medications because of things that she has been told by others in the past therefore does not want any type of controlled substance for pain and instead request prescription for ibuprofen.  Prescription was sent to patient's pharmacy for ibuprofen and she eventually agreed for a printed prescription to take with her for stronger/narcotic pain medication if pain is not being relieved adequately by ibuprofen.  If she has worsening of leg pain, increased swelling or sudden increase in shortness of breath she should go to the emergency department for further evaluation as she is at increased risk for DVT. - amoxicillin-clavulanate (AUGMENTIN) 500-125 MG tablet; Take 1 tablet (500 mg total) by mouth 2 (two) times daily. Take after eating  Dispense: 20 tablet; Refill: 0 - CBC with Differential - AMB referral to wound care center - cefTRIAXone (ROCEPHIN) injection 1 g - ibuprofen (ADVIL) 600 MG tablet; Take 1 tablet (600 mg total) by mouth every 8 (eight) hours as needed for moderate pain. Take after eating  Dispense: 60 tablet; Refill: 0 - HYDROcodone-Acetaminophen 5-300 MG TABS; One every 6 hours as needed for pain; eat something before taking medication  Dispense: 20 tablet; Refill: 0  2. Cellulitis of both lower extremities Patient with increased warmth and redness of both lower extremities in addition to open wound on the left lower extremity.   She is being treated with ceftriaxone/Rocephin 1 g injection here in the office and prescription for Augmentin.  She is also being referred to wound care. - amoxicillin-clavulanate (AUGMENTIN) 500-125 MG tablet; Take 1 tablet (500 mg total) by mouth 2 (two) times daily. Take after eating  Dispense: 20 tablet; Refill: 0 - CBC with Differential - AMB referral to wound care center - cefTRIAXone (ROCEPHIN) injection 1 g - ibuprofen (ADVIL) 600 MG tablet; Take 1 tablet (600 mg total) by mouth every 8 (eight) hours as needed for moderate pain. Take after eating  Dispense: 60 tablet; Refill: 0 - HYDROcodone-Acetaminophen 5-300 MG TABS; One every 6 hours as needed for pain; eat something before taking medication  Dispense: 20 tablet; Refill: 0  3. Tachycardia Will check CBC as patient with infection, and BMP to look for electrolyte abnormality or dehydration as a cause of her increased heart rate as well as T4/TSH to check for thyroid disorder as a cause for her increased HR.  - CBC with Differential - Basic Metabolic Panel - TSH  4. Essential hypertension Blood pressure with mild elevated but patient is also having pain and leg infection as well as recent mastectomy. She is to continue her current lisinopril, HCTZ and metoprolol at this time  5. Morbid obesity with BMI of 60.0-69.9, adult ([place]) Continue efforts at weight loss through  dietary changes   6. Venous insufficiency of both lower extremities Try to elevate legs when possible and continue HCTZ. And follow-up with wound care  7. S/P mastectomy, left 8. Malignant neoplasm of upper-outer quadrant of left breast in female, estrogen receptor positive ([place]) She appears to be doing well s/p recent mastectomy for the treatment of recently diagnosed left breast cancer. She has an upcoming appointment with her surgeon and ongoing follow-up with Oncology   An After Visit Summary was printed and given to the patient.   Follow-up: Return in about  1 week (around 01/09/2020) for leg wound- if not being seen by wound care; ED if symptoms worsen.   [name], MD

## 2020-01-02 NOTE — Progress Notes (Signed)
HTN  Mastectomy f/u and per pt its going like its suppose to  Feet on the leg side is swelling and medication is not working and do not know which one. Skin peeling and clear liquid coming from the back of her left ankle.   Per pt her feet is really tender on the left side  308.6lb

## 2020-01-03 LAB — CBC WITH DIFFERENTIAL/PLATELET
Basophils Absolute: 0 x10E3/uL (ref 0.0–0.2)
Basos: 0 %
EOS (ABSOLUTE): 0.2 x10E3/uL (ref 0.0–0.4)
Eos: 3 %
Hematocrit: 29.2 % — ABNORMAL LOW (ref 34.0–46.6)
Hemoglobin: 9.6 g/dL — ABNORMAL LOW (ref 11.1–15.9)
Immature Grans (Abs): 0.1 x10E3/uL (ref 0.0–0.1)
Immature Granulocytes: 1 %
Lymphocytes Absolute: 2.5 x10E3/uL (ref 0.7–3.1)
Lymphs: 27 %
MCH: 31.9 pg (ref 26.6–33.0)
MCHC: 32.9 g/dL (ref 31.5–35.7)
MCV: 97 fL (ref 79–97)
Monocytes Absolute: 0.7 x10E3/uL (ref 0.1–0.9)
Monocytes: 8 %
Neutrophils Absolute: 5.6 x10E3/uL (ref 1.4–7.0)
Neutrophils: 61 %
Platelets: 268 x10E3/uL (ref 150–450)
RBC: 3.01 x10E6/uL — ABNORMAL LOW (ref 3.77–5.28)
RDW: 12.4 % (ref 11.7–15.4)
WBC: 9.2 x10E3/uL (ref 3.4–10.8)

## 2020-01-03 LAB — BASIC METABOLIC PANEL WITH GFR
BUN/Creatinine Ratio: 17 (ref 12–28)
BUN: 25 mg/dL (ref 8–27)
CO2: 21 mmol/L (ref 20–29)
Calcium: 8.8 mg/dL (ref 8.7–10.3)
Chloride: 93 mmol/L — ABNORMAL LOW (ref 96–106)
Creatinine, Ser: 1.48 mg/dL — ABNORMAL HIGH (ref 0.57–1.00)
GFR calc Af Amer: 43 mL/min/1.73 — ABNORMAL LOW
GFR calc non Af Amer: 37 mL/min/1.73 — ABNORMAL LOW
Glucose: 116 mg/dL — ABNORMAL HIGH (ref 65–99)
Potassium: 4.2 mmol/L (ref 3.5–5.2)
Sodium: 135 mmol/L (ref 134–144)

## 2020-01-03 LAB — T4 AND TSH
T4, Total: 9 ug/dL (ref 4.5–12.0)
TSH: 3.08 u[IU]/mL (ref 0.450–4.500)

## 2020-01-05 ENCOUNTER — Ambulatory Visit: Payer: Medicaid Other | Attending: Internal Medicine

## 2020-01-05 DIAGNOSIS — Z23 Encounter for immunization: Secondary | ICD-10-CM

## 2020-01-05 NOTE — Progress Notes (Signed)
   Covid-19 Vaccination Clinic  Name:  [name]    MRN: [id number] DOB: June 17, 1956  01/05/2020  [name] was observed post Covid-19 immunization for 15 minutes without incident. She was provided with Vaccine Information Sheet and instruction to access the V-Safe system.   [name] was instructed to call 911 with any severe reactions post vaccine: Marland Kitchen Difficulty breathing  . Swelling of face and throat  . A fast heartbeat  . A bad rash all over body  . Dizziness and weakness   Immunizations Administered    Name Date Dose VIS Date Route   Moderna COVID-19 Vaccine 01/05/2020 10:41 AM 0.5 mL 07/2019 Intramuscular   Manufacturer: Moderna   Lot: [id number]   [place]: 94320-037-94

## 2020-01-06 ENCOUNTER — Encounter: Payer: Self-pay | Admitting: Family Medicine

## 2020-01-07 ENCOUNTER — Other Ambulatory Visit: Payer: Self-pay | Admitting: Oncology

## 2020-01-14 ENCOUNTER — Ambulatory Visit: Payer: Medicaid Other | Attending: Surgery | Admitting: Physical Therapy

## 2020-01-15 ENCOUNTER — Encounter (HOSPITAL_BASED_OUTPATIENT_CLINIC_OR_DEPARTMENT_OTHER): Payer: Medicaid Other | Attending: Internal Medicine | Admitting: Internal Medicine

## 2020-01-15 DIAGNOSIS — N189 Chronic kidney disease, unspecified: Secondary | ICD-10-CM | POA: Diagnosis not present

## 2020-01-15 DIAGNOSIS — L97221 Non-pressure chronic ulcer of left calf limited to breakdown of skin: Secondary | ICD-10-CM | POA: Insufficient documentation

## 2020-01-15 DIAGNOSIS — Z9181 History of falling: Secondary | ICD-10-CM | POA: Insufficient documentation

## 2020-01-15 DIAGNOSIS — I129 Hypertensive chronic kidney disease with stage 1 through stage 4 chronic kidney disease, or unspecified chronic kidney disease: Secondary | ICD-10-CM | POA: Diagnosis not present

## 2020-01-15 DIAGNOSIS — I872 Venous insufficiency (chronic) (peripheral): Secondary | ICD-10-CM | POA: Diagnosis not present

## 2020-01-15 DIAGNOSIS — I890 Lymphedema, not elsewhere classified: Secondary | ICD-10-CM | POA: Insufficient documentation

## 2020-01-15 DIAGNOSIS — C50912 Malignant neoplasm of unspecified site of left female breast: Secondary | ICD-10-CM | POA: Insufficient documentation

## 2020-01-15 NOTE — Progress Notes (Signed)
[name], [name] ([id number]) Visit Report for 01/15/2020 Abuse/Suicide Risk Screen Details Patient Name: Date of Service: [name] 01/15/2020 9:00 A M Medical Record Number: [id number] Patient Account Number: [id number] Date of Birth/Sex: Treating RN: [birth year] (64 y.o. [name] Primary Care Justan Gaede: [name] Other Clinician: Referring Deisy Ozbun: Treating Delene Morais/Extender: Erasmo Leventhal, Cammie Weeks in Treatment: 0 Abuse/Suicide Risk Screen Items Answer ABUSE RISK SCREEN: Has anyone close to you tried to hurt or harm you recentlyo No Do you feel uncomfortable with anyone in your familyo No Has anyone forced you do things that you didnt want to doo No Electronic Signature(s) Signed: 01/15/2020 5:27:28 PM By: Baruch Gouty RN, BSN Entered By: Baruch Gouty on 01/15/2020 09:38:00 -------------------------------------------------------------------------------- Activities of Daily Living Details Patient Name: Date of Service: [name] 01/15/2020 9:00 [place] Record Number: [id number] Patient Account Number: [id number] Date of Birth/Sex: Treating RN: 15-May-1956 (64 y.o. [name] Primary Care Anneka Studer: [name] Other Clinician: Referring Dewanda Fennema: Treating Katura Eatherly/Extender: Erasmo Leventhal, Cammie Weeks in Treatment: 0 Activities of Daily Living Items Answer Activities of Daily Living (Please select one for each item) Drive Automobile Not Able T Medications ake Completely Able Use T elephone Completely Able Care for Appearance Completely Able Use T oilet Completely Able Bath / Shower Completely Able Dress Self Completely Able Feed Self Completely Able Walk Need Assistance Get In / Out Bed Completely Able Housework Completely Able Prepare Meals Completely [place] for Self Completely Able Electronic Signature(s) Signed: 01/15/2020 5:27:28 PM By: Baruch Gouty RN, BSN Entered By:  Baruch Gouty on 01/15/2020 09:38:47 -------------------------------------------------------------------------------- Education Screening Details Patient Name: Date of Service: [name] 01/15/2020 9:00 [place] Record Number: [id number] Patient Account Number: [id number] Date of Birth/Sex: Treating RN: 1956-01-25 (64 y.o. [name] Primary Care [name]: [name] Other Clinician: Referring Mardell Suttles: Treating Reham Slabaugh/Extender: Erasmo Leventhal, Cammie Weeks in Treatment: 0 Primary Learner Assessed: Patient Learning Preferences/Education Level/Primary Language Learning Preference: Explanation, Demonstration, Printed Material Highest Education Level: College or Above Preferred Language: English Cognitive Barrier Language Barrier: No Translator Needed: No Memory Deficit: No Emotional Barrier: No Cultural/Religious Beliefs Affecting Medical Care: No Physical Barrier Impaired Vision: Yes Glasses Impaired Hearing: No Decreased Hand dexterity: No Knowledge/Comprehension Knowledge Level: High Comprehension Level: High Ability to understand written instructions: High Ability to understand verbal instructions: High Motivation Anxiety Level: Calm Cooperation: Cooperative Education Importance: Acknowledges Need Interest in Health Problems: Asks Questions Perception: Coherent Willingness to Engage in Self-Management High Activities: Readiness to Engage in Self-Management High Activities: Electronic Signature(s) Signed: 01/15/2020 5:27:28 PM By: Baruch Gouty RN, BSN Entered By: Baruch Gouty on 01/15/2020 09:39:12 -------------------------------------------------------------------------------- Fall Risk Assessment Details Patient Name: Date of Service: [name] 01/15/2020 9:00 A M Medical Record Number: [id number] Patient Account Number: [id number] Date of Birth/Sex: Treating RN: [birth year] (64 y.o. [name] Primary Care  Marque Rademaker: [name] Other Clinician: Referring Kilani Joffe: Treating Pranav Lince/Extender: Erasmo Leventhal, Cammie Weeks in Treatment: 0 Fall Risk Assessment Items Have you had 2 or more falls in the last 12 monthso 0 Yes Have you had any fall that resulted in injury in the last 12 monthso 0 Yes FALLS RISK SCREEN History of falling - immediate or within 3 months 25 Yes Secondary diagnosis (Do you have 2 or more medical diagnoseso) 0 No Ambulatory aid None/bed rest/wheelchair/nurse 0 No Crutches/cane/walker 15 Yes Furniture 0 No Intravenous therapy Access/Saline/Heparin Lock 0 No Gait/Transferring Normal/  bed rest/ wheelchair 0 No Weak (short steps with or without shuffle, stooped but able to lift head while walking, may seek 10 Yes support from furniture) Impaired (short steps with shuffle, may have difficulty arising from chair, head down, impaired 0 No balance) Mental Status Oriented to own ability 0 Yes Electronic Signature(s) Signed: 01/15/2020 5:27:28 PM By: Baruch Gouty RN, BSN Entered By: Baruch Gouty on 01/15/2020 09:39:34 -------------------------------------------------------------------------------- Foot Assessment Details Patient Name: Date of Service: [name] 01/15/2020 9:00 [place] Record Number: [id number] Patient Account Number: [id number] Date of Birth/Sex: Treating RN: April 01, 1956 (65 y.o. [name] Primary Care Chanie Soucek: [name] Other Clinician: Referring Arliss Frisina: Treating Stehanie Ekstrom/Extender: Erasmo Leventhal, Cammie Weeks in Treatment: 0 Foot Assessment Items Site Locations + = Sensation present, - = Sensation absent, C = Callus, U = Ulcer R = Redness, W = Warmth, M = Maceration, PU = Pre-ulcerative lesion F = Fissure, S = Swelling, D = Dryness Assessment Right: Left: Other Deformity: No No Prior Foot Ulcer: No No Prior Amputation: No No Charcot Joint: No No Ambulatory Status: Ambulatory With  Help Assistance Device: Walker Gait: Steady Electronic Signature(s) Signed: 01/15/2020 5:27:28 PM By: Baruch Gouty RN, BSN Entered By: Baruch Gouty on 01/15/2020 09:40:43 -------------------------------------------------------------------------------- Nutrition Risk Screening Details Patient Name: Date of Service: [name] 01/15/2020 9:00 A M Medical Record Number: [id number] Patient Account Number: [id number] Date of Birth/Sex: Treating RN: 19-Jan-1956 (64 y.o. [name] Primary Care Alsie Younes: [name] Other Clinician: Referring Avalynne Diver: Treating Rory Xiang/Extender: Erasmo Leventhal, Cammie Weeks in Treatment: 0 Height (in): 61 Weight (lbs): 300 Body Mass Index (BMI): 56.7 Nutrition Risk Screening Items Score Screening NUTRITION RISK SCREEN: I have an illness or condition that made me change the kind and/or amount of food I eat 0 No I eat fewer than two meals per day 3 Yes I eat few fruits and vegetables, or milk products 0 No I have three or more drinks of beer, liquor or wine almost every day 0 No I have tooth or mouth problems that make it hard for me to eat 0 No I don't always have enough money to buy the food I need 0 No I eat alone most of the time 0 No I take three or more different prescribed or over-the-counter drugs a day 1 Yes Without wanting to, I have lost or gained 10 pounds in the last six months 2 Yes I am not always physically able to shop, cook and/or feed myself 0 No Nutrition Protocols Good Risk Protocol Moderate Risk Protocol High Risk Proctocol 0 Provide education on nutrition Risk Level: High Risk Score: 6 Electronic Signature(s) Signed: 01/15/2020 5:27:28 PM By: Baruch Gouty RN, BSN Entered By: Baruch Gouty on 01/15/2020 09:40:25

## 2020-01-17 ENCOUNTER — Other Ambulatory Visit: Payer: Self-pay

## 2020-01-17 ENCOUNTER — Inpatient Hospital Stay (HOSPITAL_BASED_OUTPATIENT_CLINIC_OR_DEPARTMENT_OTHER): Payer: Medicaid Other | Admitting: Oncology

## 2020-01-17 ENCOUNTER — Inpatient Hospital Stay: Payer: Medicaid Other | Attending: Oncology

## 2020-01-17 ENCOUNTER — Encounter: Payer: Self-pay | Admitting: Oncology

## 2020-01-17 VITALS — BP 80/44 | HR 92 | Temp 97.8°F | Resp 18 | Ht 61.0 in | Wt 303.9 lb

## 2020-01-17 DIAGNOSIS — Z7289 Other problems related to lifestyle: Secondary | ICD-10-CM | POA: Insufficient documentation

## 2020-01-17 DIAGNOSIS — I10 Essential (primary) hypertension: Secondary | ICD-10-CM | POA: Diagnosis not present

## 2020-01-17 DIAGNOSIS — Z6841 Body Mass Index (BMI) 40.0 and over, adult: Secondary | ICD-10-CM

## 2020-01-17 DIAGNOSIS — Z833 Family history of diabetes mellitus: Secondary | ICD-10-CM | POA: Insufficient documentation

## 2020-01-17 DIAGNOSIS — Z170 Estrogen receptor positive status [ER+]: Secondary | ICD-10-CM | POA: Diagnosis not present

## 2020-01-17 DIAGNOSIS — Z79899 Other long term (current) drug therapy: Secondary | ICD-10-CM | POA: Diagnosis not present

## 2020-01-17 DIAGNOSIS — Z8249 Family history of ischemic heart disease and other diseases of the circulatory system: Secondary | ICD-10-CM | POA: Diagnosis not present

## 2020-01-17 DIAGNOSIS — Z823 Family history of stroke: Secondary | ICD-10-CM | POA: Insufficient documentation

## 2020-01-17 DIAGNOSIS — C50412 Malignant neoplasm of upper-outer quadrant of left female breast: Secondary | ICD-10-CM

## 2020-01-17 DIAGNOSIS — C773 Secondary and unspecified malignant neoplasm of axilla and upper limb lymph nodes: Secondary | ICD-10-CM | POA: Insufficient documentation

## 2020-01-17 DIAGNOSIS — Z8041 Family history of malignant neoplasm of ovary: Secondary | ICD-10-CM | POA: Insufficient documentation

## 2020-01-17 DIAGNOSIS — Z818 Family history of other mental and behavioral disorders: Secondary | ICD-10-CM | POA: Diagnosis not present

## 2020-01-17 MED ORDER — DEXAMETHASONE 4 MG PO TABS: ORAL_TABLET | ORAL | 1 refills | Status: DC

## 2020-01-17 MED ORDER — PROCHLORPERAZINE MALEATE 10 MG PO TABS: ORAL_TABLET | ORAL | 1 refills | Status: DC

## 2020-01-17 MED ORDER — LIDOCAINE-PRILOCAINE 2.5-2.5 % EX CREA: TOPICAL_CREAM | CUTANEOUS | 3 refills | Status: DC

## 2020-01-17 NOTE — Progress Notes (Signed)
Sue, [name] ([id number]) Visit Report for 01/15/2020 Allergy List Details Patient Name: Date of Service: [name] 01/15/2020 9:00 A M Medical Record Number: [id number] Patient Account Number: [id number] Date of Birth/Sex: Treating RN: 06-05-1956 (64 y.o. [name] Primary Care Elica Almas: [name] Other Clinician: Referring Jalee Saine: Treating Akemi Overholser/Extender: Erasmo Leventhal, Cammie Weeks in Treatment: 0 Allergies Active Allergies amlodipine Reaction: swelling Severity: Moderate Allergy Notes Electronic Signature(s) Signed: 01/15/2020 5:27:28 PM By: Baruch Gouty RN, BSN Entered By: Baruch Gouty on 01/15/2020 09:28:00 -------------------------------------------------------------------------------- Arrival Information Details Patient Name: Date of Service: [name] [name] 01/15/2020 9:00 A M Medical Record Number: [id number] Patient Account Number: [id number] Date of Birth/Sex: Treating RN: [birth year] (64 y.o. [name] Primary Care Raeanne Deschler: [name] Other Clinician: Referring Adreanna Fickel: Treating Lynnlee Revels/Extender: Erasmo Leventhal, Cammie Weeks in Treatment: 0 Visit Information Patient Arrived: Wheel Chair Arrival Time: 09:15 Accompanied By: self Transfer Assistance: None Patient Identification Verified: Yes Secondary Verification Process Completed: Yes Patient Requires Transmission-Based Precautions: No Patient Has Alerts: No Electronic Signature(s) Signed: 01/15/2020 5:27:28 PM By: Baruch Gouty RN, BSN Entered By: Baruch Gouty on 01/15/2020 09:21:06 -------------------------------------------------------------------------------- Clinic Level of Care Assessment Details Patient Name: Date of Service: [name] 01/15/2020 9:00 A M Medical Record Number: [id number] Patient Account Number: [id number] Date of Birth/Sex: Treating RN: [birth year] (64 y.o. Orvan Falconer Primary Care Keyandre Pileggi: [name] Other  Clinician: Referring Melony Tenpas: Treating Koltan Portocarrero/Extender: Erasmo Leventhal, Cammie Weeks in Treatment: 0 Clinic Level of Care Assessment Items TOOL 1 Quantity Score X- 1 0 Use when EandM and Procedure is performed on INITIAL visit ASSESSMENTS - Nursing Assessment / Reassessment X- 1 20 General Physical Exam (combine w/ comprehensive assessment (listed just below) when performed on new pt. evals) X- 1 25 Comprehensive Assessment (HX, ROS, Risk Assessments, Wounds Hx, etc.) ASSESSMENTS - Wound and Skin Assessment / Reassessment []  - 0 Dermatologic / Skin Assessment (not related to wound area) ASSESSMENTS - Ostomy and/or Continence Assessment and Care []  - 0 Incontinence Assessment and Management []  - 0 Ostomy Care Assessment and Management (repouching, etc.) PROCESS - Coordination of Care X - Simple Patient / Family Education for ongoing care 1 15 []  - 0 Complex (extensive) Patient / Family Education for ongoing care X- 1 10 Staff obtains Programmer, systems, Records, T Results / Process Orders est []  - 0 Staff telephones HHA, Nursing Homes / Clarify orders / etc []  - 0 Routine Transfer to another Facility (non-emergent condition) []  - 0 Routine Hospital Admission (non-emergent condition) X- 1 15 New Admissions / Biomedical engineer / Ordering NPWT Apligraf, etc. , []  - 0 Emergency Hospital Admission (emergent condition) PROCESS - Special Needs []  - 0 Pediatric / Minor Patient Management []  - 0 Isolation Patient Management []  - 0 Hearing / Language / Visual special needs []  - 0 Assessment of Community assistance (transportation, D/C planning, etc.) []  - 0 Additional assistance / Altered mentation []  - 0 Support Surface(s) Assessment (bed, cushion, seat, etc.) INTERVENTIONS - Miscellaneous []  - 0 External ear exam []  - 0 Patient Transfer (multiple staff / Civil Service fast streamer / Similar devices) []  - 0 Simple Staple / Suture removal (25 or less) []  - 0 Complex Staple /  Suture removal (26 or more) []  - 0 Hypo/Hyperglycemic Management (do not check if billed separately) X- 1 15 Ankle / Brachial Index (ABI) - do not check if billed separately Has the patient been seen at the hospital within the last three years: Yes Total  Score: 100 Level Of Care: New/Established - Level 3 Electronic Signature(s) Signed: 01/15/2020 5:13:38 PM By: Carlene Coria RN Entered By: Carlene Coria on 01/15/2020 10:13:59 -------------------------------------------------------------------------------- Compression Therapy Details Patient Name: Date of Service: [name] [name] 01/15/2020 9:00 [place] Record Number: [id number] Patient Account Number: [id number] Date of Birth/Sex: Treating RN: 08/15/1956 (64 y.o. Orvan Falconer Primary Care Alexzavier Girardin: [name] Other Clinician: Referring Scottie Metayer: Treating Glorian Mcdonell/Extender: Erasmo Leventhal, Cammie Weeks in Treatment: 0 Compression Therapy Performed for Wound Assessment: Wound #1 Left,Posterior Lower Leg Performed By: Clinician Carlene Coria, RN Compression Type: Four Layer Post Procedure Diagnosis Same as Pre-procedure Electronic Signature(s) Signed: 01/15/2020 5:13:38 PM By: Carlene Coria RN Entered By: Carlene Coria on 01/15/2020 10:13:13 -------------------------------------------------------------------------------- Encounter Discharge Information Details Patient Name: Date of Service: [name] 01/15/2020 9:00 A M Medical Record Number: [id number] Patient Account Number: [id number] Date of Birth/Sex: Treating RN: [birth year] (64 y.o. [name] Primary Care Quantia Grullon: [name] Other Clinician: Referring Rosa Wyly: Treating Brenan [place]/Extender: Erasmo Leventhal, Cammie Weeks in Treatment: 0 Encounter Discharge Information Items Discharge Condition: Stable Ambulatory Status: Wheelchair Discharge Destination: Home Transportation: Private Auto Accompanied By: self Schedule Follow-up  Appointment: Yes Clinical Summary of Care: Patient Declined Electronic Signature(s) Signed: 01/15/2020 5:47:21 PM By: Kela Millin Entered By: Kela Millin on 01/15/2020 10:35:41 -------------------------------------------------------------------------------- Lower Extremity Assessment Details Patient Name: Date of Service: [place] 01/15/2020 9:00 A M Medical Record Number: [id number] Patient Account Number: [id number] Date of Birth/Sex: Treating RN: April 11, 1956 (64 y.o. [name] Primary Care Thressa Shiffer: [name] Other Clinician: Referring Lizbett Garciagarcia: Treating Katyra Tomassetti/Extender: Erasmo Leventhal, Cammie Weeks in Treatment: 0 Edema Assessment Assessed: [Left: No] [Right: No] E[Left: dema] [Right: :] Calf Left: Right: Point of Measurement: cm From Medial Instep 46.5 cm cm Ankle Left: Right: Point of Measurement: cm From Medial Instep 30 cm cm Vascular Assessment Pulses: Dorsalis Pedis Palpable: [Left:No] Posterior Tibial Palpable: [Left:No] Blood Pressure: Brachial: [Left:110] Dorsalis Pedis: 118 Ankle: Posterior Tibial: 90 Ankle Brachial Index: [Left:1.07] Electronic Signature(s) Signed: 01/15/2020 5:27:28 PM By: Baruch Gouty RN, BSN Entered By: Baruch Gouty on 01/15/2020 09:58:06 -------------------------------------------------------------------------------- Multi Wound Chart Details Patient Name: Date of Service: [name] [name] 01/15/2020 9:00 A M Medical Record Number: [id number] Patient Account Number: [id number] Date of Birth/Sex: Treating RN: [birth year] (64 y.o. Orvan Falconer Primary Care Brindle Leyba: [name] Other Clinician: Referring Deshawna Mcneece: Treating Rhodes Calvert/Extender: Erasmo Leventhal, Cammie Weeks in Treatment: 0 Vital Signs Height(in): 61 Pulse(bpm): 99 Weight(lbs): 300 Blood Pressure(mmHg): 110/29 Body Mass Index(BMI): 57 Temperature(F): 97.7 Respiratory Rate(breaths/min): 20 Photos: [1:No  Photos Left, Posterior Lower Leg] [N/A:N/A N/A] Wound Location: [1:Gradually Appeared] [N/A:N/A] Wounding Event: [1:Lymphedema] [N/A:N/A] Primary Etiology: [1:Lymphedema, Hypertension] [N/A:N/A] Comorbid History: [1:12/16/2019] [N/A:N/A] Date Acquired: [1:0] [N/A:N/A] Weeks of Treatment: [1:Open] [N/A:N/A] Wound Status: [1:6x4.4x0.1] [N/A:N/A] Measurements L x W x D (cm) [1:20.735] [N/A:N/A] A (cm) : rea [1:2.073] [N/A:N/A] Volume (cm) : [1:Full Thickness Without Exposed] [N/A:N/A] Classification: [1:Support Structures Large] [N/A:N/A] Exudate Amount: [1:Serous] [N/A:N/A] Exudate Type: [1:amber] [N/A:N/A] Exudate Color: [1:Indistinct, nonvisible] [N/A:N/A] Wound Margin: [1:Small (1-33%)] [N/A:N/A] Granulation Amount: [1:Pink] [N/A:N/A] Granulation Quality: [1:Small (1-33%)] [N/A:N/A] Necrotic Amount: [1:Fat Layer (Subcutaneous Tissue)] [N/A:N/A] Exposed Structures: [1:Exposed: Yes Fascia: No Tendon: No Muscle: No Joint: No Bone: No Medium (34-66%)] [N/A:N/A] Epithelialization: [1:Compression Therapy] [N/A:N/A] Treatment Notes Wound #1 (Left, Posterior Lower Leg) 1. Cleanse With Wound Cleanser 2. Periwound Care Moisturizing lotion 3. Primary Dressing Applied Calcium Alginate Ag 4. Secondary Dressing ABD Pad Kerramax/Xtrasorb Drawtex  6. Support Layer Applied 4 layer compression wrap Notes netting. explained wrap to patient and when to contact wound care facility, patient stated full understanding Electronic Signature(s) Signed: 01/15/2020 5:13:38 PM By: Carlene Coria RN Signed: 01/17/2020 12:52:55 PM By: Linton Ham MD Entered By: Linton Ham on 01/15/2020 11:07:40 -------------------------------------------------------------------------------- [place] Details Patient Name: Date of Service: [place] 01/15/2020 9:00 [place] Record Number: [id number] Patient Account Number: [id number] Date of Birth/Sex: Treating RN: 05/20/1956 (65  y.o. Orvan Falconer Primary Care Aruna Nestler: [name] Other Clinician: Referring Nelida Mandarino: Treating Shauntavia Brackin/Extender: Erasmo Leventhal, Cammie Weeks in Treatment: 0 Active Inactive Wound/Skin Impairment Nursing Diagnoses: Knowledge deficit related to ulceration/compromised skin integrity Goals: Patient/caregiver will verbalize understanding of skin care regimen Date Initiated: 01/15/2020 [place] Resolution Date: 02/15/2020 Goal Status: Active Ulcer/skin breakdown will have a volume reduction of 30% by week 4 Date Initiated: 01/15/2020 Target Resolution Date: 02/15/2020 Goal Status: Active Interventions: Assess patient/caregiver ability to obtain necessary supplies Assess patient/caregiver ability to perform ulcer/skin care regimen upon admission and as needed Assess ulceration(s) every visit Notes: Electronic Signature(s) Signed: 01/15/2020 5:13:38 PM By: Carlene Coria RN Entered By: Carlene Coria on 01/15/2020 10:09:32 -------------------------------------------------------------------------------- Pain Assessment Details Patient Name: Date of Service: [name] [name] 01/15/2020 9:00 A M Medical Record Number: [id number] Patient Account Number: [id number] Date of Birth/Sex: Treating RN: Jun 22, 1956 (64 y.o. [name] Primary Care Whitnie Deleon: [name] Other Clinician: Referring Skeeter Sheard: Treating Jhostin Epps/Extender: Erasmo Leventhal, Cammie Weeks in Treatment: 0 Active Problems Location of Pain Severity and Description of Pain Patient Has Paino Yes Site Locations Pain Location: Pain in Ulcers With Dressing Change: Yes Duration of the Pain. Constant / Intermittento Intermittent Rate the pain. Current Pain Level: 0 Worst Pain Level: 7 Least Pain Level: 0 Character of Pain Describe the Pain: Other: stinging Pain Management and Medication Current Pain Management: Medication: Yes Leg drop or elevation: Yes Rest: Yes Is the Current Pain Management  Adequate: Adequate How does your wound impact your activities of daily livingo Sleep: Yes Bathing: No Appetite: No Relationship With Others: No Bladder Continence: No Emotions: No Bowel Continence: No Hobbies: No Toileting: No Dressing: No Electronic Signature(s) Signed: 01/15/2020 5:27:28 PM By: Baruch Gouty RN, BSN Entered By: Baruch Gouty on 01/15/2020 09:49:39 -------------------------------------------------------------------------------- Patient/Caregiver Education Details Patient Name: Date of Service: [name] [name] 5/18/2021andnbsp9:00 A M Medical Record Number: [id number] Patient Account Number: [id number] Date of Birth/Gender: Treating RN: [birth year] (64 y.o. Orvan Falconer Primary Care Physician: [name] Other Clinician: Referring Physician: Treating Physician/Extender: Lydia Guiles in Treatment: 0 Education Assessment Education Provided To: Patient Education Topics Provided Wound/Skin Impairment: Methods: Explain/Verbal Responses: State content correctly Electronic Signature(s) Signed: 01/15/2020 5:13:38 PM By: Carlene Coria RN Entered By: Carlene Coria on 01/15/2020 10:09:45 -------------------------------------------------------------------------------- Wound Assessment Details Patient Name: Date of Service: [place] 01/15/2020 9:00 A M Medical Record Number: [id number] Patient Account Number: [id number] Date of Birth/Sex: Treating RN: [birth year] (64 y.o. [name] Primary Care Loribeth Katich: [name] Other Clinician: Referring Ashyra Cantin: Treating Paylin Hailu/Extender: Erasmo Leventhal, Cammie Weeks in Treatment: 0 Wound Status Wound Number: 1 Primary Etiology: Lymphedema Wound Location: Left, Posterior Lower Leg Wound Status: Open Wounding Event: Gradually Appeared Comorbid History: Lymphedema, Hypertension Date Acquired: 12/16/2019 Weeks Of Treatment: 0 Clustered Wound: No Wound  Measurements Length: (cm) 6 Width: (cm) 4.4 Depth: (cm) 0.1 Area: (cm) 20.735 Volume: (cm) 2.073 % Reduction in Area: % Reduction in Volume: Epithelialization:  Medium (34-66%) Tunneling: No Undermining: No Wound Description Classification: Full Thickness Without Exposed Support Structures Wound Margin: Indistinct, nonvisible Exudate Amount: Large Exudate Type: Serous Exudate Color: amber Foul Odor After Cleansing: No Slough/Fibrino Yes Wound Bed Granulation Amount: Small (1-33%) Exposed Structure Granulation Quality: Pink Fascia Exposed: No Necrotic Amount: Small (1-33%) Fat Layer (Subcutaneous Tissue) Exposed: Yes Necrotic Quality: Adherent Slough Tendon Exposed: No Muscle Exposed: No Joint Exposed: No Bone Exposed: No Treatment Notes Wound #1 (Left, Posterior Lower Leg) 1. Cleanse With Wound Cleanser 2. Periwound Care Moisturizing lotion 3. Primary Dressing Applied Calcium Alginate Ag 4. Secondary Dressing ABD Pad Kerramax/Xtrasorb Drawtex 6. Support Layer Applied 4 layer compression wrap Notes netting. explained wrap to patient and when to contact wound care facility, patient stated full understanding Electronic Signature(s) Signed: 01/15/2020 5:27:28 PM By: Baruch Gouty RN, BSN Entered By: Baruch Gouty on 01/15/2020 09:48:32 -------------------------------------------------------------------------------- [place] Details Patient Name: Date of Service: [name] [name] 01/15/2020 9:00 [place] Record Number: [id number] Patient Account Number: [id number] Date of Birth/Sex: Treating RN: Jan 30, 1956 (64 y.o. [name] Primary Care Ulysess Witz: [name] Other Clinician: Referring Jayel Scaduto: Treating Ellisyn Icenhower/Extender: Erasmo Leventhal, Cammie Weeks in Treatment: 0 Vital Signs Time Taken: 09:21 Temperature (F): 97.7 Height (in): 61 Pulse (bpm): 99 Source: Stated Respiratory Rate (breaths/min): 20 Weight (lbs): 300 Blood Pressure  (mmHg): 110/29 Source: Stated Reference Range: 80 - 120 mg / dl Body Mass Index (BMI): 56.7 Electronic Signature(s) Signed: 01/15/2020 5:27:28 PM By: Baruch Gouty RN, BSN Entered By: Baruch Gouty on 01/15/2020 09:27:38

## 2020-01-17 NOTE — Progress Notes (Signed)
[name] ([id number]) Visit Report for 01/15/2020 Chief Complaint Document Details Patient Name: Date of Service: [name] 01/15/2020 9:00 A M Medical Record Number: [id number] Patient Account Number: [id number] Date of Birth/Sex: Treating RN: 04-Dec-1955 (64 y.o. Orvan Falconer Primary Care Provider: [name] Other Clinician: Referring Provider: Treating Provider/Extender: Erasmo Leventhal, Cammie Weeks in Treatment: 0 Information Obtained from: Patient Chief Complaint 01/15/2020; patient is here for review of the wound on her left posterior calf just above the Achilles area Electronic Signature(s) Signed: 01/17/2020 12:52:55 PM By: Linton Ham MD Entered By: Linton Ham on 01/15/2020 11:08:10 -------------------------------------------------------------------------------- HPI Details Patient Name: Date of Service: [name] 01/15/2020 9:00 A M Medical Record Number: [id number] Patient Account Number: [id number] Date of Birth/Sex: Treating RN: 1955-11-03 (64 y.o. Orvan Falconer Primary Care Provider: [name] Other Clinician: Referring Provider: Treating Provider/Extender: Erasmo Leventhal, Cammie Weeks in Treatment: 0 History of Present Illness HPI Description: ADMISSION 01/15/2020 Patient is a pleasant 64 year old woman who was referred here for a wound on the posterior aspect of her left calf just above the Achilles area. She basically says that she noticed a wet area on her posterior calf about a month ago. Since then it has been draining and stinging. She saw her primary doctor on 01/02/2020 and was given a course of Augmentin after a shot of Rocephin in the office. Since then she has been using Neosporin and a Band-Aid but the Band-Aid rarely stays on because of the drainage. She does not have a prior wound history. Past medical history she is not a diabetic. She has a recent diagnosis of breast cancer status post left mastectomy a few  weeks ago. She is supposed to start chemotherapy. She has morbid obesity, venous insufficiency chronic renal failure hypertension and falls. There is also some suggestion of alcohol abuse. Patient denies the latter. ABI in our clinic on the left was 1.07 Electronic Signature(s) Signed: 01/17/2020 12:52:55 PM By: Linton Ham MD Entered By: Linton Ham on 01/15/2020 11:10:18 -------------------------------------------------------------------------------- Physical Exam Details Patient Name: Date of Service: [name] 01/15/2020 9:00 A M Medical Record Number: [id number] Patient Account Number: [id number] Date of Birth/Sex: Treating RN: November 17, 1955 (64 y.o. Orvan Falconer Primary Care Provider: [name] Other Clinician: Referring Provider: Treating Provider/Extender: Erasmo Leventhal, Cammie Weeks in Treatment: 0 Constitutional Wide pulse pressure over the patient appeared well. Pulse regular and within target range for patient.Marland Kitchen Respirations regular, non-labored and within target range.. Temperature is normal and within the target range for the patient.. No apparent distress. Respiratory work of breathing is normal. Bilateral breath sounds are clear and equal in all lobes with no wheezes, rales or rhonchi.. Cardiovascular Heart rhythm and rate regular, without murmur or gallop. No signs of CHF.Marland Kitchen Pedal pulses absent bilaterally. However I think this is mostly because of swelling her foot is warm on the left. Edema present in both extremities. This is severe bilateral well up into her thighs. Peau d'orange appearance of the skin in her thighs and upper lower calfs.. Integumentary (Hair, Skin) Skin changes related to chronic lymphedema on both sides including small nodules. Hyperkeratotic. Psychiatric appears at normal baseline. Notes Wound exam; large but superficial area on the posterior calf. Weeping draining fluid. There is no obvious cellulitis here currently. No  debridement was necessary. Electronic Signature(s) Signed: 01/17/2020 12:52:55 PM By: Linton Ham MD Entered By: Linton Ham on 01/15/2020 11:13:46 -------------------------------------------------------------------------------- Physician Orders Details Patient Name: Date of Service:  [name] 01/15/2020 9:00 A M Medical Record Number: [id number] Patient Account Number: [id number] Date of Birth/Sex: Treating RN: [birth year] (64 y.o. Orvan Falconer Primary Care Provider: [name] Other Clinician: Referring Provider: Treating Provider/Extender: Erasmo Leventhal, Cammie Weeks in Treatment: 0 Verbal / Phone Orders: No Diagnosis Coding Follow-up Appointments Return Appointment in 2 weeks. Nurse Visit: - 1 week Dressing Change Frequency Do not change entire dressing for one week. Skin Barriers/Peri-Wound Care Barrier cream Moisturizing lotion Wound Cleansing May shower with protection. Primary Wound Dressing Wound #1 Left,Posterior Lower Leg Calcium Alginate with Silver Secondary Dressing Wound #1 Left,Posterior Lower Leg Dry Gauze ABD pad Drawtex Edema Control 4 layer compression: Left lower extremity Electronic Signature(s) Signed: 01/15/2020 5:13:38 PM By: Carlene Coria RN Signed: 01/17/2020 12:52:55 PM By: Linton Ham MD Entered By: Carlene Coria on 01/15/2020 10:20:39 -------------------------------------------------------------------------------- Problem List Details Patient Name: Date of Service: [name] 01/15/2020 9:00 A M Medical Record Number: [id number] Patient Account Number: [id number] Date of Birth/Sex: Treating RN: [birth year] (64 y.o. Orvan Falconer Primary Care Provider: [name] Other Clinician: Referring Provider: Treating Provider/Extender: Erasmo Leventhal, Cammie Weeks in Treatment: 0 Active Problems ICD-10 Encounter Code Description Active Date MDM Diagnosis I89.0 Lymphedema, not elsewhere classified  01/15/2020 No Yes L97.221 Non-pressure chronic ulcer of left calf limited to breakdown of skin 01/15/2020 No Yes Inactive Problems Resolved Problems Electronic Signature(s) Signed: 01/17/2020 12:52:55 PM By: Linton Ham MD Entered By: Linton Ham on 01/15/2020 10:22:41 -------------------------------------------------------------------------------- Progress Note Details Patient Name: Date of Service: [name] 01/15/2020 9:00 A M Medical Record Number: [id number] Patient Account Number: [id number] Date of Birth/Sex: Treating RN: 11/21/1955 (64 y.o. Orvan Falconer Primary Care Provider: [name] Other Clinician: Referring Provider: Treating Provider/Extender: Erasmo Leventhal, Cammie Weeks in Treatment: 0 Subjective Chief Complaint Information obtained from Patient 01/15/2020; patient is here for review of the wound on her left posterior calf just above the Achilles area History of Present Illness (HPI) ADMISSION 01/15/2020 Patient is a pleasant 64 year old woman who was referred here for a wound on the posterior aspect of her left calf just above the Achilles area. She basically says that she noticed a wet area on her posterior calf about a month ago. Since then it has been draining and stinging. She saw her primary doctor on 01/02/2020 and was given a course of Augmentin after a shot of Rocephin in the office. Since then she has been using Neosporin and a Band-Aid but the Band-Aid rarely stays on because of the drainage. She does not have a prior wound history. Past medical history she is not a diabetic. She has a recent diagnosis of breast cancer status post left mastectomy a few weeks ago. She is supposed to start chemotherapy. She has morbid obesity, venous insufficiency chronic renal failure hypertension and falls. There is also some suggestion of alcohol abuse. Patient denies the latter. ABI in our clinic on the left was 1.07 Patient History Information  obtained from Patient. Allergies amlodipine (Severity: Moderate, Reaction: swelling) Family History Cancer - Siblings, Diabetes - Child, Hypertension - Mother, Stroke - Maternal Grandparents, No family history of Heart Disease, Hereditary Spherocytosis, Kidney Disease, Lung Disease, Seizures, Thyroid Problems, Tuberculosis. Social History Never smoker, Marital Status - Divorced, Alcohol Use - Daily - whiskey, Drug Use - Prior History - TCH, Caffeine Use - Rarely. Medical History Eyes Denies history of Cataracts, Glaucoma, Optic Neuritis Ear/Nose/Mouth/Throat Denies history of Chronic sinus problems/congestion, Middle ear problems  Hematologic/Lymphatic Patient has history of Lymphedema Cardiovascular Patient has history of Hypertension Endocrine Denies history of Type I Diabetes, Type II Diabetes Genitourinary Denies history of End Stage Renal Disease Integumentary (Skin) Denies history of History of Burn Oncologic Denies history of Received Chemotherapy, Received Radiation Psychiatric Denies history of Anorexia/bulimia, Confinement Anxiety Hospitalization/Surgery History - left mastectomy. - tubal ligation. - right radial ORIF. Medical A Surgical History Notes nd Constitutional Symptoms (General Health) morbid obesity Ear/Nose/Mouth/Throat seasonal allergies Gastrointestinal diverticulosis, alcoholic hepatitis Musculoskeletal hx right radial fx Oncologic starting chemo 01/22/20 for breast CA Review of Systems (ROS) Constitutional Symptoms (General Health) Denies complaints or symptoms of Fatigue, Fever, Chills, Marked Weight Change. Eyes Complains or has symptoms of Glasses / Contacts. Denies complaints or symptoms of Dry Eyes, Vision Changes. Ear/Nose/Mouth/Throat Denies complaints or symptoms of Chronic sinus problems or rhinitis. Respiratory Complains or has symptoms of Shortness of Breath. Denies complaints or symptoms of Chronic or frequent  coughs. Cardiovascular Denies complaints or symptoms of Chest pain. Endocrine Denies complaints or symptoms of Heat/cold intolerance. Genitourinary Denies complaints or symptoms of Frequent urination. Integumentary (Skin) Complains or has symptoms of Wounds - left lower leg. Neurologic Denies complaints or symptoms of Numbness/parasthesias. Psychiatric Denies complaints or symptoms of Claustrophobia, Suicidal. Objective Constitutional Wide pulse pressure over the patient appeared well. Pulse regular and within target range for patient.Marland Kitchen Respirations regular, non-labored and within target range.. Temperature is normal and within the target range for the patient.. No apparent distress. Vitals Time Taken: 9:21 AM, Height: 61 in, Source: Stated, Weight: 300 lbs, Source: Stated, BMI: 56.7, Temperature: 97.7 F, Pulse: 99 bpm, Respiratory Rate: 20 breaths/min, Blood Pressure: 110/29 mmHg. Respiratory work of breathing is normal. Bilateral breath sounds are clear and equal in all lobes with no wheezes, rales or rhonchi.. Cardiovascular Heart rhythm and rate regular, without murmur or gallop. No signs of CHF.Marland Kitchen Pedal pulses absent bilaterally. However I think this is mostly because of swelling her foot is warm on the left. Edema present in both extremities. This is severe bilateral well up into her thighs. Peau d'orange appearance of the skin in her thighs and upper lower calfs.. Psychiatric appears at normal baseline. General Notes: Wound exam; large but superficial area on the posterior calf. Weeping draining fluid. There is no obvious cellulitis here currently. No debridement was necessary. Integumentary (Hair, Skin) Skin changes related to chronic lymphedema on both sides including small nodules. Hyperkeratotic. Wound #1 status is Open. Original cause of wound was Gradually Appeared. The wound is located on the Left,Posterior Lower Leg. The wound measures 6cm length x 4.4cm width x 0.1cm  depth; 20.735cm^2 area and 2.073cm^3 volume. There is Fat Layer (Subcutaneous Tissue) Exposed exposed. There is no tunneling or undermining noted. There is a large amount of serous drainage noted. The wound margin is indistinct and nonvisible. There is small (1-33%) pink granulation within the wound bed. There is a small (1-33%) amount of necrotic tissue within the wound bed including Adherent Slough. Assessment Active Problems ICD-10 Lymphedema, not elsewhere classified Non-pressure chronic ulcer of left calf limited to breakdown of skin Procedures Wound #1 Pre-procedure diagnosis of Wound #1 is a Lymphedema located on the Left,Posterior Lower Leg . There was a Four Layer Compression Therapy Procedure by Carlene Coria, RN. Post procedure Diagnosis Wound #1: Same as Pre-Procedure Plan Follow-up Appointments: Return Appointment in 2 weeks. Nurse Visit: - 1 week Dressing Change Frequency: Do not change entire dressing for one week. Skin Barriers/Peri-Wound Care: Barrier cream Moisturizing lotion Wound Cleansing: May  shower with protection. Primary Wound Dressing: Wound #1 Left,Posterior Lower Leg: Calcium Alginate with Silver Secondary Dressing: Wound #1 Left,Posterior Lower Leg: Dry Gauze ABD pad Drawtex Edema Control: 4 layer compression: Left lower extremity 1. We are going to use silver alginate packed with ABDs under 4-layer compression on the left leg 2. I think the primary problem here is longstanding lymphedema. She is going to require compression to control the drainage here in close this wound or else she is in danger of this expanding with more tissue breakdown 3. After this closes she is going to need compression stockings and with the extent of this may be compression pumps 4. I have talked to her about keeping her legs elevated. Of course she sleeps in a recliner with her legs on the floor I have asked her to stop this. 5. I have encouraged activities like walking.  Muscular activity helps to mobilize fluid 6. We will see her back next week. As mentioned I did not think there was current evidence of cellulitis in the cellulitis previously responded to the Augmentin prescribed by primary care I spent 45 minutes in review of this patient's record, face-to-face evaluation and preparation of this record Electronic Signature(s) Signed: 01/17/2020 12:52:55 PM By: Linton Ham MD Entered By: Linton Ham on 01/15/2020 11:16:34 -------------------------------------------------------------------------------- HxROS Details Patient Name: Date of Service: [name] 01/15/2020 9:00 [place] Record Number: [id number] Patient Account Number: [id number] Date of Birth/Sex: Treating RN: December 11, 1955 (64 y.o. [name] Primary Care Provider: [name] Other Clinician: Referring Provider: Treating Provider/Extender: Erasmo Leventhal, Cammie Weeks in Treatment: 0 Information Obtained From Patient Constitutional Symptoms (General Health) Complaints and Symptoms: Negative for: Fatigue; Fever; Chills; Marked Weight Change Medical History: Past Medical History Notes: morbid obesity Eyes Complaints and Symptoms: Positive for: Glasses / Contacts Negative for: Dry Eyes; Vision Changes Medical History: Negative for: Cataracts; Glaucoma; Optic Neuritis Ear/Nose/Mouth/Throat Complaints and Symptoms: Negative for: Chronic sinus problems or rhinitis Medical History: Negative for: Chronic sinus problems/congestion; Middle ear problems Past Medical History Notes: seasonal allergies Respiratory Complaints and Symptoms: Positive for: Shortness of Breath Negative for: Chronic or frequent coughs Cardiovascular Complaints and Symptoms: Negative for: Chest pain Medical History: Positive for: Hypertension Endocrine Complaints and Symptoms: Negative for: Heat/cold intolerance Medical History: Negative for: Type I Diabetes; Type II  Diabetes Genitourinary Complaints and Symptoms: Negative for: Frequent urination Medical History: Negative for: End Stage Renal Disease Integumentary (Skin) Complaints and Symptoms: Positive for: Wounds - left lower leg Medical History: Negative for: History of Burn Neurologic Complaints and Symptoms: Negative for: Numbness/parasthesias Psychiatric Complaints and Symptoms: Negative for: Claustrophobia; Suicidal Medical History: Negative for: Anorexia/bulimia; Confinement Anxiety Hematologic/Lymphatic Medical History: Positive for: Lymphedema Gastrointestinal Medical History: Past Medical History Notes: diverticulosis, alcoholic hepatitis Immunological Musculoskeletal Medical History: Past Medical History Notes: hx right radial fx Oncologic Medical History: Negative for: Received Chemotherapy; Received Radiation Past Medical History Notes: starting chemo 01/22/20 for breast CA Immunizations Pneumococcal Vaccine: Received Pneumococcal Vaccination: Yes Implantable Devices Yes Hospitalization / Surgery History Type of Hospitalization/Surgery left mastectomy tubal ligation right radial ORIF Family and Social History Cancer: Yes - Siblings; Diabetes: Yes - Child; Heart Disease: No; Hereditary Spherocytosis: No; Hypertension: Yes - Mother; Kidney Disease: No; Lung Disease: No; Seizures: No; Stroke: Yes - Maternal Grandparents; Thyroid Problems: No; Tuberculosis: No; Never smoker; Marital Status - Divorced; Alcohol Use: Daily - whiskey; Drug Use: Prior History - TCH; Caffeine Use: Rarely; Financial Concerns: No; Food, Clothing or Shelter Needs: No; Support  System Lacking: No; Transportation Concerns: No Electronic Signature(s) Signed: 01/15/2020 5:27:28 PM By: Baruch Gouty RN, BSN Signed: 01/17/2020 12:52:55 PM By: Linton Ham MD Entered By: Baruch Gouty on 01/15/2020  09:37:52 -------------------------------------------------------------------------------- SuperBill Details Patient Name: Date of Service: [name] 01/15/2020 Medical Record Number: [id number] Patient Account Number: [id number] Date of Birth/Sex: Treating RN: 01-Nov-1955 (64 y.o. Orvan Falconer Primary Care Provider: [name] Other Clinician: Referring Provider: Treating Provider/Extender: Erasmo Leventhal, Cammie Weeks in Treatment: 0 Diagnosis Coding ICD-10 Codes Code Description I89.0 Lymphedema, not elsewhere classified L97.221 Non-pressure chronic ulcer of left calf limited to breakdown of skin Facility Procedures CPT4 Code: [id number] Description: [place] VISIT-LEV 3 EST PT Modifier: 25 Quantity: 1 CPT4 Code: [id number] Description: (Facility Use Only) [id number] - [place] LWR LT LEG Modifier: Quantity: 1 Physician Procedures : CPT4 Code Description Modifier [id number] - WC PHYS LEVEL 4 - NEW PT ICD-10 Diagnosis Description I89.0 Lymphedema, not elsewhere classified L97.221 Non-pressure chronic ulcer of left calf limited to breakdown of skin Quantity: 1 Electronic Signature(s) Signed: 01/15/2020 5:13:38 PM By: Carlene Coria RN Signed: 01/17/2020 12:52:55 PM By: Linton Ham MD Entered By: Carlene Coria on 01/15/2020 13:00:50

## 2020-01-17 NOTE — Progress Notes (Signed)
Met w/ pt to introduce myself as her Arboriculturist and to discuss the [employer].  Pt's insurance will pay for her treatment at 100% so copay assistance isn't needed.  I offered the [employer], went over what it covers, gave her the income requirement and an expense sheet.  Pt would like to apply so she will provide proof of income.  She also stated she needs assistance w/ transportation so I made Ginette Otto aware of pt's needs.  Pt has my card for any questions or concerns she may have in the future.

## 2020-01-17 NOTE — Progress Notes (Signed)
[place]  [phone] [phone]     ID: [name] DOB: [birth year]  MR#: [id number]  WYO#:[id number]  Patient Care Team: [name], MD as PCP - General (Family Medicine) Mauro Kaufmann, RN as Oncology Nurse Navigator Rockwell Germany, RN as Oncology Nurse Navigator [name], MD as Consulting Physician (General Surgery) Magrinat, Virgie Dad, MD as Consulting Physician (Oncology) Kyung Rudd, MD as Consulting Physician (Radiation Oncology) Edrick Kins, DPM as Consulting Physician (Podiatry) Chauncey Cruel, MD OTHER MD:  CHIEF COMPLAINT: estrogen receptor positive breast cancer  CURRENT TREATMENT: Adjuvant chemotherapy    INTERVAL HISTORY: [name] returns today for follow up of her estrogen receptor positive breast cancer.  We are going to proceed with adjuvant chemotherapy which will consist of cyclophosphamide and doxorubicin in dose dense fashion x4 followed by weekly paclitaxel x12. The target start date is 01/22/2020.  Her most recent echocardiogram on 11/26/2019 showed an ejection fraction of 65-70%.  She received her second Covid-19 vaccine dose on 01/05/2020.   REVIEW OF SYSTEMS: [name] is going to lymphedema clinic and had her left lower extremity wrapped.  She feels this is helping.  She is taking a brief course of antibiotics.  This is not causing her stomach problems.  She tolerated port placement well and has no unusual soreness or other problems from it.  A detailed review of systems today was otherwise stable   HISTORY OF CURRENT ILLNESS: From the original intake note:  [name] (pronounced "yellow") had routine screening mammography on 10/24/2019 showing a possible abnormality in the left breast. She underwent left breast ultrasonography at [facility] on 11/05/2019 showing: suspicious 3.8 cm mass in left breast at 3 o'clock; five abnormal lymph nodes.  Accordingly on 11/12/2019 she proceeded to  biopsy of the left breast area in question as well as a lymph node. The pathology from this procedure ([id number]) showed: invasive ductal carcinoma with prominent inflammatory response, grade 3. Prognostic indicators significant for: estrogen receptor, 100% positive and progesterone receptor, 50% positive, both with strong staining intensity. Proliferation marker Ki67 at 70%. HER2 negative by immunohistochemistry (0).  The biopsied lymph node confirmed metastatic breast carcinoma.  The patient's subsequent history is as detailed below.   PAST MEDICAL HISTORY: Past Medical History:  Diagnosis Date  . Arthritis   . Cancer ([place]) 11/2019   Left breast  . Diverticulitis   . Family history of ovarian cancer   . Heart murmur    11/26/19 echo: Mild AS. AV mean gradient 12.0 mmHg; however, LVOT gradient 8 mmHg with intracvitary gradient-significant AS is not suspected  . Hypertension     PAST SURGICAL HISTORY: Past Surgical History:  Procedure Laterality Date  . MASTECTOMY WITH AXILLARY LYMPH NODE DISSECTION Left 12/19/2019   Procedure: LEFT MASTECTOMY WITH AXILLARY LYMPH NODE DISSECTION;  Surgeon: [name], MD;  Location: [facility];  Service: General;  Laterality: Left;  Marland Kitchen MULTIPLE EXTRACTIONS WITH ALVEOLOPLASTY Bilateral 12/14/2019   Procedure: MULTIPLE EXTRACTION WITH ALVEOLOPLASTY;  Surgeon: [name], DDS;  Location: [facility];  Service: Oral Surgery;  Laterality: Bilateral;  . PORTACATH PLACEMENT N/A 12/19/2019   Procedure: INSERTION PORT-A-CATH WITH ULTRASOUND GUIDANCE;  Surgeon: [name], MD;  Location: [facility];  Service: General;  Laterality: N/A;  . RADIAL HEAD ARTHROPLASTY Right 06/25/2019   Procedure: RIGHT RADIAL HEAD ARTHROPLASTY;  Surgeon: [name], MD;  Location: [facility];  Service: Orthopedics;  Laterality: Right;  . TUBAL LIGATION      FAMILY HISTORY:  Family History  Problem Relation Age of Onset  . Hypertension Mother   . Dementia Mother   . Ovarian cancer  Half-Sister 85  . Diabetes Half-Sister   . Stroke Half-Sister   Her father died at age 55; she is unsure of the cause of death. Her mother is age 86 as of 10/2019.  The patient has two brothers and four sisters. She reports ovarian cancer in her sister at age 38. She denies a family history of breast, prostate, or pancreatic cancer.   GYNECOLOGIC HISTORY:  No LMP recorded. Patient is postmenopausal. Menarche: 64 years old Age at first live birth: 64 years old [place] 2 LMP around age 35 Contraceptive: used previously HRT never used  Hysterectomy? no BSO? no   SOCIAL HISTORY: (updated 10/2019)  [name] retired from working at the [employer]. She is divorced. She lives at home alone but during the pandemic her sister [name] and mother [name] lived in the same home. [name], age 72, lives in [place] and is handicapped. [name], age 38, is a Biomedical scientist here in [place]. [name] has two granddaughters. She attends a local [employer].    ADVANCED DIRECTIVES: not in place; at the 11/21/2019 visit the patient was given the appropriate documents to complete and notarized at her discretion.  She expressed spontaneously that she would not want to be resuscitated in case of a terminal event.    HEALTH MAINTENANCE: Social History   Tobacco Use  . Smoking status: Never Smoker  . Smokeless tobacco: Never Used  Substance Use Topics  . Alcohol use: Yes    Alcohol/week: 3.0 standard drinks    Types: 1 Cans of beer, 2 Shots of liquor per week    Comment: daily  . Drug use: Yes    Types: Marijuana     Colonoscopy: never done  PAP: "1980?"  Bone density: never done   Allergies  Allergen Reactions  . Amlodipine Swelling    BLE edema    Current Outpatient Medications  Medication Sig Dispense Refill  . amoxicillin (AMOXIL) 500 MG capsule Take 1 capsule (500 mg total) by mouth 3 (three) times daily. (Patient not taking: Reported on 01/02/2020) 21 capsule 0  . amoxicillin-clavulanate  (AUGMENTIN) 500-125 MG tablet Take 1 tablet (500 mg total) by mouth 2 (two) times daily. Take after eating 20 tablet 0  . benazepril (LOTENSIN) 20 MG tablet TAKE 1 TABLET (20 MG TOTAL) BY MOUTH DAILY. TO LOWER BLOOD PRESSURE (Patient taking differently: Take 20 mg by mouth daily. ) 90 tablet 1  . Blood Pressure Monitoring ([employer] ADVANCED BP MONITOR) DEVI Measure blood pressure daily and record in diary 1 Device 0  . dexamethasone (DECADRON) 4 MG tablet Take1 tablet by mouth twice a day on the day after chemotherapy and the day after that, then the morning of day 4, then stop. Take with food. 30 tablet 1  . furosemide (LASIX) 40 MG tablet Take 40 mg by mouth daily.    . hydrochlorothiazide (HYDRODIURIL) 25 MG tablet Take 1 tablet (25 mg total) by mouth daily. 90 tablet 1  . HYDROcodone-Acetaminophen 5-300 MG TABS One every 6 hours as needed for pain; eat something before taking medication 20 tablet 0  . ibuprofen (ADVIL) 600 MG tablet Take 1 tablet (600 mg total) by mouth every 8 (eight) hours as needed for moderate pain. Take after eating 60 tablet 0  . KLOR-CON M20 20 MEQ tablet TAKE 1 TABLET BY MOUTH EVERY DAY (Patient taking  differently: Take 20 mEq by mouth daily. ) 30 tablet 3  . lidocaine-prilocaine (EMLA) cream Apply to affected area once 30 g 3  . meloxicam (MOBIC) 15 MG tablet Take 1 tablet (15 mg total) by mouth daily. 30 tablet 2  . metoprolol tartrate (LOPRESSOR) 50 MG tablet Take 1 tablet (50 mg total) by mouth 2 (two) times daily. 60 tablet 3  . Multiple Vitamins-Minerals (CENTRUM SILVER ADULT 50+) TABS Take 1 tablet by mouth daily.     . Multiple Vitamins-Minerals (HAIR SKIN AND NAILS FORMULA PO) Take 1 tablet by mouth daily at 2 PM.    . ondansetron (ZOFRAN) 4 MG tablet Take 1-2 tablets (4-8 mg total) by mouth every 8 (eight) hours as needed for nausea or vomiting. (Patient not taking: Reported on 09/05/2019) 40 tablet 0  . oxyCODONE-acetaminophen (PERCOCET) 5-325 MG tablet Take 1 tablet  by mouth every 4 (four) hours as needed. (Patient not taking: Reported on 01/02/2020) 30 tablet 0  . prochlorperazine (COMPAZINE) 10 MG tablet Take one tablet 3 times a day before meals starting the evening of chemo and continuing for 3 days 30 tablet 1   No current facility-administered medications for this visit.    OBJECTIVE: African-American woman examined in a wheelchair  Vitals:   01/17/20 1145  BP: (!) 80/44  Pulse: 92  Resp: 18  Temp: 97.8 F (36.6 C)  SpO2: 100%     Body mass index is 57.42 kg/m.   Wt Readings from Last 3 Encounters:  01/17/20 (!) 303 lb 14.4 oz (137.8 kg)  01/02/20 (!) 308 lb 9.6 oz (140 kg)  12/25/19 (!) 315 lb 4.8 oz (143 kg)      ECOG FS:2 - Symptomatic, <50% confined to bed  Sclerae unicteric, EOMs intact Wearing a mask No cervical or supraclavicular adenopathy Lungs no rales or rhonchi Heart regular rate and rhythm Abd soft, obese, nontender, positive bowel sounds MSK left lower leg wrapped Neuro: nonfocal, well oriented, appropriate affect Breasts: Deferred   LAB RESULTS:  CMP     Component Value Date/Time   NA 135 01/02/2020 1710   K 4.2 01/02/2020 1710   CL 93 (L) 01/02/2020 1710   CO2 21 01/02/2020 1710   GLUCOSE 116 (H) 01/02/2020 1710   GLUCOSE 100 (H) 12/25/2019 0919   BUN 25 01/02/2020 1710   CREATININE 1.48 (H) 01/02/2020 1710   CREATININE 1.59 (H) 11/21/2019 0908   CALCIUM 8.8 01/02/2020 1710   PROT 7.7 12/25/2019 0919   PROT 8.5 06/15/2019 1622   ALBUMIN 2.8 (L) 12/25/2019 0919   ALBUMIN 4.3 06/15/2019 1622   AST 91 (H) 12/25/2019 0919   AST 61 (H) 11/21/2019 0908   ALT 37 12/25/2019 0919   ALT 32 11/21/2019 0908   ALKPHOS 58 12/25/2019 0919   BILITOT 0.4 12/25/2019 0919   BILITOT 0.5 11/21/2019 0908   GFRNONAA 37 (L) 01/02/2020 1710   GFRNONAA 34 (L) 11/21/2019 0908   GFRAA 43 (L) 01/02/2020 1710   GFRAA 39 (L) 11/21/2019 0908    No results found for: TOTALPROTELP, ALBUMINELP, A1GS, A2GS, BETS, BETA2SER,  GAMS, MSPIKE, SPEI  Lab Results  Component Value Date   WBC 9.2 01/02/2020   NEUTROABS 5.6 01/02/2020   HGB 9.6 (L) 01/02/2020   HCT 29.2 (L) 01/02/2020   MCV 97 01/02/2020   PLT 268 01/02/2020    No results found for: LABCA2  No components found for: [id number]  No results for input(s): INR in the last 168 hours.  No results  found for: LABCA2  No results found for: KJZ791  No results found for: TAV697  No results found for: XYI016  No results found for: CA2729  No components found for: HGQUANT  No results found for: CEA1 / No results found for: CEA1   No results found for: AFPTUMOR  No results found for: CHROMOGRNA  No results found for: KPAFRELGTCHN, LAMBDASER, KAPLAMBRATIO (kappa/lambda light chains)  No results found for: HGBA, HGBA2QUANT, HGBFQUANT, HGBSQUAN (Hemoglobinopathy evaluation)   No results found for: LDH  Lab Results  Component Value Date   IRON 56 11/10/2007   (Iron and TIBC)  Lab Results  Component Value Date   FERRITIN 3000 (H) 11/10/2007    Urinalysis    Component Value Date/Time   COLORURINE AMBER (A) 11/03/2018 1627   APPEARANCEUR HAZY (A) 11/03/2018 1627   LABSPEC 1.025 11/03/2018 1627   PHURINE 5.0 11/03/2018 1627   GLUCOSEU NEGATIVE 11/03/2018 1627   HGBUR NEGATIVE 11/03/2018 1627   BILIRUBINUR MODERATE (A) 11/03/2018 1627   KETONESUR 20 (A) 11/03/2018 1627   PROTEINUR 30 (A) 11/03/2018 1627   UROBILINOGEN 1.0 07/14/2012 1630   NITRITE NEGATIVE 11/03/2018 1627   LEUKOCYTESUR NEGATIVE 11/03/2018 1627     STUDIES: DG CHEST PORT 1 VIEW  Result Date: 12/19/2019 CLINICAL DATA:  Left breast cancer, chest wall catheter placement EXAM: PORTABLE CHEST 1 VIEW COMPARISON:  10/05/2006 FINDINGS: Single frontal view of the chest demonstrates right chest wall port via subclavian approach tip overlying superior vena cava. Postsurgical changes are seen within the left breast, with surgical drain in place. Cardiac silhouette is  unremarkable. Lung volumes are diminished, without airspace disease, effusion, or pneumothorax. IMPRESSION: 1. No complication after right chest wall port placement. 2. Postsurgical changes left breast. 3. Low lung volumes. Electronically Signed   By: Randa Ngo M.D.   On: 12/19/2019 15:57   DG Fluoro Guide CV Line-No Report  Result Date: 12/19/2019 Fluoroscopy was utilized by the requesting physician.  No radiographic interpretation.     ELIGIBLE FOR AVAILABLE RESEARCH PROTOCOL: AET  ASSESSMENT: 64 y.o. [place] woman status post left breast upper outer quadrant and left axillary lymph node biopsies 11/12/2019 for a clinical T2 N2, stage IIIA invasive ductal carcinoma, grade 3, estrogen and progesterone receptor positive, HER-2 not amplified, with an MIB-1 of 70%  (a) the biopsied axillary lymph node was positive  (1) genetics testing 11/29/2019 through the Breast Cancer STAT Panel offered by [facility] found no deleterious mutations in ATM, BRCA1, BRCA2, CDH1, CHEK2, PALB2, PTEN, STK11 and TP53.  Additional testing through the Common Hereditary Cancers Panel confirmed no deleterious mutations in APC, ATM, AXIN2, BARD1, BMPR1A, BRCA1, BRCA2, BRIP1, CDH1, CDK4, CDKN2A (p14ARF), CDKN2A (p16INK4a), CHEK2, CTNNA1, DICER1, EPCAM (Deletion/duplication testing only), GREM1 (promoter region deletion/duplication testing only), KIT, MEN1, MLH1, MSH2, MSH3, MSH6, MUTYH, NBN, NF1, NHTL1, PALB2, PDGFRA, PMS2, POLD1, POLE, PTEN, RAD50, RAD51C, RAD51D, RNF43, SDHB, SDHC, SDHD, SMAD4, SMARCA4. STK11, TP53, TSC1, TSC2, and VHL.  The following genes were evaluated for sequence changes only: SDHA and HOXB13 c.251G>A variant only.  (2) status post left modified radical mastectomy 12/19/2019 for a pT2 pN1, stage IIB invasive ductal carcinoma, grade 3, with negative margins.  (a) 1 of 13 axillary lymph nodes removed was positive  (3) adjuvant chemotherapy will consist of cyclophosphamide and doxorubicin in dose  dense fashion x4 starting 01/22/2020 followed by weekly paclitaxel x12  (a) baseline echocardiogram 11/26/2019 shows an ejection fraction in the 65-70% range.  (b) first cycle of cyclophosphamide and doxorubicin dose  reduced to assess tolerance  (4) adjuvant radiation as appropriate  (5) antiestrogens to follow   PLAN:  [name] 's blood pressure was quite low today.  Likely this is due to the fact that they have added Lasix to her medications.  She also receives hydrochlorothiazide and is on 2 other antihypertensives.  I asked her to stop the Lasix.  They will continue to wrap the left lower extremity to see if we can get the lymphedema under some control.  Given her locally extensive tumor I want to treat her with standard of care but I am concerned enough about her overall health that I am dropping the dose of the first cycle by one third.  If she tolerates it well we will inch up to target dose over the next 2 cycles.  I do want to make sure we watch her very closely.  She will meet with our chemotherapy teaching nurse today for more details on how to take her supportive medications.  All the orders have been entered.  Total encounter time 25 minutes.Sarajane Jews C. Magrinat, MD 01/17/2020 12:30 PM Medical Oncology and Hematology [facility] [place], [place] [address] Tel. [phone]    Fax. [phone]   This document serves as a record of services personally performed by Lurline Del, MD. It was created on his behalf by Wilburn Mylar, a trained medical scribe. The creation of this record is based on the scribe's personal observations and the provider's statements to them.   I, Lurline Del MD, have reviewed the above documentation for accuracy and completeness, and I agree with the above.   *Total Encounter Time as defined by the Centers for Medicare and Medicaid Services includes, in addition to the face-to-face time of a patient visit  (documented in the note above) non-face-to-face time: obtaining and reviewing outside history, ordering and reviewing medications, tests or procedures, care coordination (communications with other health care professionals or caregivers) and documentation in the medical record.

## 2020-01-18 ENCOUNTER — Telehealth: Payer: Self-pay | Admitting: Oncology

## 2020-01-18 NOTE — Telephone Encounter (Signed)
Scheduled appts per 5/20 los. Pt confirmed appt dates and times.  

## 2020-01-21 MED FILL — Dexamethasone Sodium Phosphate Inj 100 MG/10ML: INTRAMUSCULAR | Qty: 1 | Status: AC

## 2020-01-21 MED FILL — Fosaprepitant Dimeglumine For IV Infusion 150 MG (Base Eq): INTRAVENOUS | Qty: 5 | Status: AC

## 2020-01-22 ENCOUNTER — Inpatient Hospital Stay (HOSPITAL_BASED_OUTPATIENT_CLINIC_OR_DEPARTMENT_OTHER): Payer: Medicaid Other | Admitting: Medical

## 2020-01-22 ENCOUNTER — Telehealth: Payer: Self-pay | Admitting: *Deleted

## 2020-01-22 ENCOUNTER — Inpatient Hospital Stay (HOSPITAL_COMMUNITY): Payer: Medicaid Other

## 2020-01-22 ENCOUNTER — Encounter: Payer: Self-pay | Admitting: *Deleted

## 2020-01-22 ENCOUNTER — Inpatient Hospital Stay: Payer: Medicaid Other

## 2020-01-22 ENCOUNTER — Other Ambulatory Visit: Payer: Self-pay | Admitting: *Deleted

## 2020-01-22 ENCOUNTER — Encounter (HOSPITAL_COMMUNITY): Payer: Self-pay

## 2020-01-22 ENCOUNTER — Other Ambulatory Visit: Payer: Self-pay

## 2020-01-22 ENCOUNTER — Inpatient Hospital Stay (HOSPITAL_COMMUNITY)
Admission: EM | Admit: 2020-01-22 | Discharge: 2020-01-28 | DRG: 683 | Disposition: A | Discharge status: Home Health | Payer: Medicaid Other | Source: Ambulatory Visit | Attending: Internal Medicine | Admitting: Internal Medicine

## 2020-01-22 DIAGNOSIS — I959 Hypotension, unspecified: Secondary | ICD-10-CM | POA: Diagnosis not present

## 2020-01-22 DIAGNOSIS — E872 Acidosis: Secondary | ICD-10-CM | POA: Diagnosis not present

## 2020-01-22 DIAGNOSIS — C50412 Malignant neoplasm of upper-outer quadrant of left female breast: Secondary | ICD-10-CM

## 2020-01-22 DIAGNOSIS — Z170 Estrogen receptor positive status [ER+]: Secondary | ICD-10-CM

## 2020-01-22 DIAGNOSIS — L89892 Pressure ulcer of other site, stage 2: Secondary | ICD-10-CM | POA: Diagnosis present

## 2020-01-22 DIAGNOSIS — E871 Hypo-osmolality and hyponatremia: Secondary | ICD-10-CM | POA: Diagnosis not present

## 2020-01-22 DIAGNOSIS — L89152 Pressure ulcer of sacral region, stage 2: Secondary | ICD-10-CM | POA: Diagnosis present

## 2020-01-22 DIAGNOSIS — I10 Essential (primary) hypertension: Secondary | ICD-10-CM | POA: Diagnosis present

## 2020-01-22 DIAGNOSIS — N170 Acute kidney failure with tubular necrosis: Principal | ICD-10-CM | POA: Diagnosis present

## 2020-01-22 DIAGNOSIS — R7881 Bacteremia: Secondary | ICD-10-CM | POA: Diagnosis not present

## 2020-01-22 DIAGNOSIS — I129 Hypertensive chronic kidney disease with stage 1 through stage 4 chronic kidney disease, or unspecified chronic kidney disease: Secondary | ICD-10-CM | POA: Diagnosis not present

## 2020-01-22 DIAGNOSIS — Z9012 Acquired absence of left breast and nipple: Secondary | ICD-10-CM

## 2020-01-22 DIAGNOSIS — I890 Lymphedema, not elsewhere classified: Secondary | ICD-10-CM | POA: Diagnosis present

## 2020-01-22 DIAGNOSIS — D649 Anemia, unspecified: Secondary | ICD-10-CM | POA: Diagnosis present

## 2020-01-22 DIAGNOSIS — E861 Hypovolemia: Secondary | ICD-10-CM | POA: Diagnosis present

## 2020-01-22 DIAGNOSIS — N1832 Chronic kidney disease, stage 3b: Secondary | ICD-10-CM | POA: Diagnosis not present

## 2020-01-22 DIAGNOSIS — Z79899 Other long term (current) drug therapy: Secondary | ICD-10-CM

## 2020-01-22 DIAGNOSIS — E869 Volume depletion, unspecified: Secondary | ICD-10-CM | POA: Diagnosis not present

## 2020-01-22 DIAGNOSIS — N183 Chronic kidney disease, stage 3 unspecified: Secondary | ICD-10-CM | POA: Diagnosis not present

## 2020-01-22 DIAGNOSIS — N179 Acute kidney failure, unspecified: Secondary | ICD-10-CM | POA: Diagnosis not present

## 2020-01-22 DIAGNOSIS — T502X5A Adverse effect of carbonic-anhydrase inhibitors, benzothiadiazides and other diuretics, initial encounter: Secondary | ICD-10-CM | POA: Diagnosis present

## 2020-01-22 DIAGNOSIS — I9589 Other hypotension: Secondary | ICD-10-CM

## 2020-01-22 DIAGNOSIS — Z66 Do not resuscitate: Secondary | ICD-10-CM | POA: Diagnosis present

## 2020-01-22 DIAGNOSIS — B952 Enterococcus as the cause of diseases classified elsewhere: Secondary | ICD-10-CM | POA: Diagnosis not present

## 2020-01-22 DIAGNOSIS — T39395A Adverse effect of other nonsteroidal anti-inflammatory drugs [NSAID], initial encounter: Secondary | ICD-10-CM | POA: Diagnosis present

## 2020-01-22 DIAGNOSIS — I950 Idiopathic hypotension: Secondary | ICD-10-CM | POA: Diagnosis not present

## 2020-01-22 DIAGNOSIS — Z791 Long term (current) use of non-steroidal anti-inflammatories (NSAID): Secondary | ICD-10-CM | POA: Diagnosis not present

## 2020-01-22 DIAGNOSIS — C50912 Malignant neoplasm of unspecified site of left female breast: Secondary | ICD-10-CM | POA: Diagnosis not present

## 2020-01-22 DIAGNOSIS — N189 Chronic kidney disease, unspecified: Secondary | ICD-10-CM | POA: Diagnosis not present

## 2020-01-22 DIAGNOSIS — A4181 Sepsis due to Enterococcus: Secondary | ICD-10-CM | POA: Diagnosis not present

## 2020-01-22 DIAGNOSIS — Z20822 Contact with and (suspected) exposure to covid-19: Secondary | ICD-10-CM | POA: Diagnosis present

## 2020-01-22 DIAGNOSIS — T445X5A Adverse effect of predominantly beta-adrenoreceptor agonists, initial encounter: Secondary | ICD-10-CM | POA: Diagnosis present

## 2020-01-22 DIAGNOSIS — Z95828 Presence of other vascular implants and grafts: Secondary | ICD-10-CM

## 2020-01-22 DIAGNOSIS — Z03818 Encounter for observation for suspected exposure to other biological agents ruled out: Secondary | ICD-10-CM | POA: Diagnosis not present

## 2020-01-22 DIAGNOSIS — C773 Secondary and unspecified malignant neoplasm of axilla and upper limb lymph nodes: Secondary | ICD-10-CM | POA: Diagnosis not present

## 2020-01-22 LAB — CBC WITH DIFFERENTIAL/PLATELET
Abs Immature Granulocytes: 0.06 10*3/uL (ref 0.00–0.07)
Basophils Absolute: 0 10*3/uL (ref 0.0–0.1)
Basophils Relative: 0 %
Eosinophils Absolute: 1.6 10*3/uL — ABNORMAL HIGH (ref 0.0–0.5)
Eosinophils Relative: 26 %
HCT: 23.8 % — ABNORMAL LOW (ref 36.0–46.0)
Hemoglobin: 8.4 g/dL — ABNORMAL LOW (ref 12.0–15.0)
Immature Granulocytes: 1 %
Lymphocytes Relative: 22 %
Lymphs Abs: 1.4 10*3/uL (ref 0.7–4.0)
MCH: 32.6 pg (ref 26.0–34.0)
MCHC: 35.3 g/dL (ref 30.0–36.0)
MCV: 92.2 fL (ref 80.0–100.0)
Monocytes Absolute: 0.4 10*3/uL (ref 0.1–1.0)
Monocytes Relative: 6 %
Neutro Abs: 2.8 10*3/uL (ref 1.7–7.7)
Neutrophils Relative %: 45 %
Platelets: 210 10*3/uL (ref 150–400)
RBC: 2.58 MIL/uL — ABNORMAL LOW (ref 3.87–5.11)
RDW: 12.2 % (ref 11.5–15.5)
WBC: 6.3 10*3/uL (ref 4.0–10.5)
nRBC: 0 % (ref 0.0–0.2)

## 2020-01-22 LAB — URINALYSIS, ROUTINE W REFLEX MICROSCOPIC
Bilirubin Urine: NEGATIVE
Glucose, UA: NEGATIVE mg/dL
Hgb urine dipstick: NEGATIVE
Ketones, ur: NEGATIVE mg/dL
Nitrite: NEGATIVE
Protein, ur: NEGATIVE mg/dL
Specific Gravity, Urine: 1.009 (ref 1.005–1.030)
pH: 5 (ref 5.0–8.0)

## 2020-01-22 LAB — OSMOLALITY, URINE: Osmolality, Ur: 208 mOsm/kg — ABNORMAL LOW (ref 300–900)

## 2020-01-22 LAB — HIV ANTIBODY (ROUTINE TESTING W REFLEX): HIV Screen 4th Generation wRfx: NONREACTIVE

## 2020-01-22 LAB — LACTIC ACID, PLASMA: Lactic Acid, Venous: 1.5 mmol/L (ref 0.5–1.9)

## 2020-01-22 LAB — CREATININE, SERUM
Creatinine, Ser: 5.76 mg/dL — ABNORMAL HIGH (ref 0.44–1.00)
GFR calc Af Amer: 8 mL/min — ABNORMAL LOW (ref >60)
GFR calc non Af Amer: 7 mL/min — ABNORMAL LOW (ref >60)

## 2020-01-22 LAB — COMPREHENSIVE METABOLIC PANEL
ALT: 34 U/L (ref 0–44)
AST: 35 U/L (ref 15–41)
Albumin: 2.7 g/dL — ABNORMAL LOW (ref 3.5–5.0)
Alkaline Phosphatase: 62 U/L (ref 38–126)
Anion gap: 14 (ref 5–15)
BUN: 74 mg/dL — ABNORMAL HIGH (ref 8–23)
CO2: 16 mmol/L — ABNORMAL LOW (ref 22–32)
Calcium: 8.4 mg/dL — ABNORMAL LOW (ref 8.9–10.3)
Chloride: 89 mmol/L — ABNORMAL LOW (ref 98–111)
Creatinine, Ser: 6.02 mg/dL (ref 0.44–1.00)
GFR calc Af Amer: 8 mL/min — ABNORMAL LOW (ref >60)
GFR calc non Af Amer: 7 mL/min — ABNORMAL LOW (ref >60)
Glucose, Bld: 119 mg/dL — ABNORMAL HIGH (ref 70–99)
Potassium: 4.8 mmol/L (ref 3.5–5.1)
Sodium: 119 mmol/L — ABNORMAL LOW (ref 135–145)
Total Bilirubin: 0.2 mg/dL — ABNORMAL LOW (ref 0.3–1.2)
Total Protein: 7.2 g/dL (ref 6.5–8.1)

## 2020-01-22 LAB — PROTEIN / CREATININE RATIO, URINE
Creatinine, Urine: 115.21 mg/dL
Protein Creatinine Ratio: 0.18 mg/mg{Cre} — ABNORMAL HIGH (ref 0.00–0.15)
Total Protein, Urine: 21 mg/dL

## 2020-01-22 LAB — IRON AND TIBC
Iron: 111 ug/dL (ref 28–170)
Saturation Ratios: 48 % — ABNORMAL HIGH (ref 10.4–31.8)
TIBC: 231 ug/dL — ABNORMAL LOW (ref 250–450)
UIBC: 120 ug/dL

## 2020-01-22 LAB — CBC
HCT: 25.1 % — ABNORMAL LOW (ref 36.0–46.0)
Hemoglobin: 8.3 g/dL — ABNORMAL LOW (ref 12.0–15.0)
MCH: 31.8 pg (ref 26.0–34.0)
MCHC: 33.1 g/dL (ref 30.0–36.0)
MCV: 96.2 fL (ref 80.0–100.0)
Platelets: 203 10*3/uL (ref 150–400)
RBC: 2.61 MIL/uL — ABNORMAL LOW (ref 3.87–5.11)
RDW: 12.3 % (ref 11.5–15.5)
WBC: 5.9 10*3/uL (ref 4.0–10.5)
nRBC: 0 % (ref 0.0–0.2)

## 2020-01-22 LAB — MAGNESIUM: Magnesium: 1.3 mg/dL — ABNORMAL LOW (ref 1.7–2.4)

## 2020-01-22 LAB — ECHOCARDIOGRAM LIMITED

## 2020-01-22 LAB — TSH: TSH: 1.802 u[IU]/mL (ref 0.350–4.500)

## 2020-01-22 LAB — SARS CORONAVIRUS 2 BY RT PCR (HOSPITAL ORDER, PERFORMED IN ~~LOC~~ HOSPITAL LAB): SARS Coronavirus 2: NEGATIVE

## 2020-01-22 LAB — SODIUM, URINE, RANDOM: Sodium, Ur: 19 mmol/L

## 2020-01-22 IMAGING — US US RENAL
1 series · 14 of 25 positions shown · non-contrast
Comparison: None.

CLINICAL DATA: Acute renal failure.

EXAM:
RENAL / URINARY TRACT ULTRASOUND COMPLETE

[Series 1: us renal · 14 of 63 slices shown]
[im 1/63]
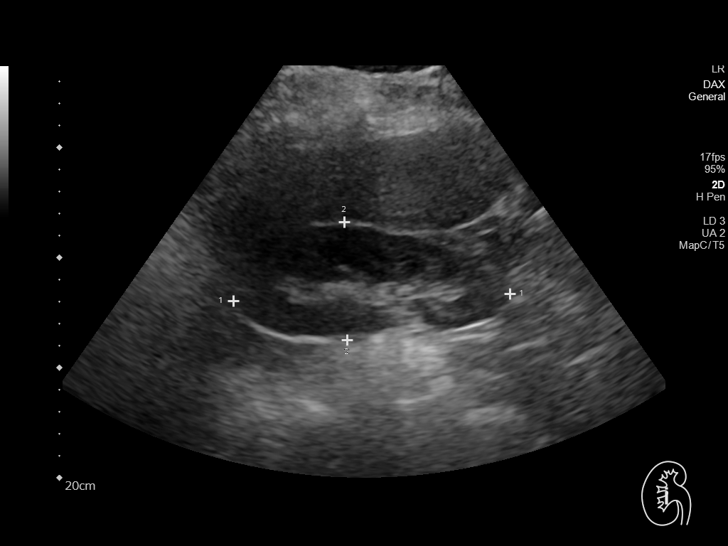
[im 6/63]
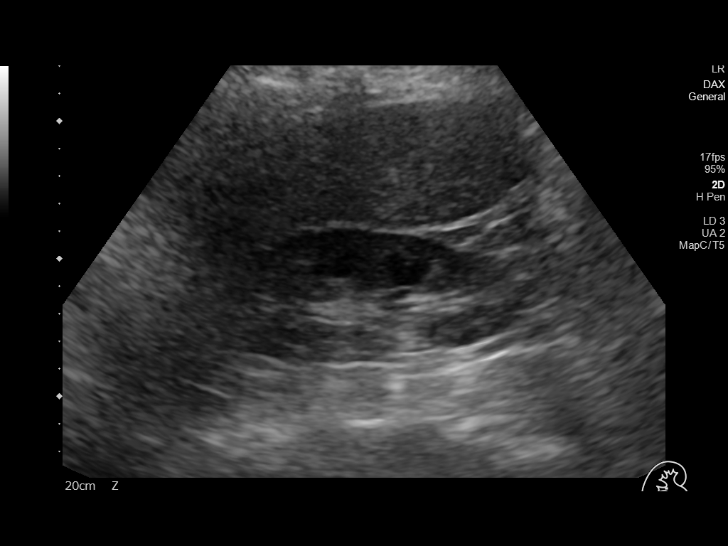
[im 11/63]
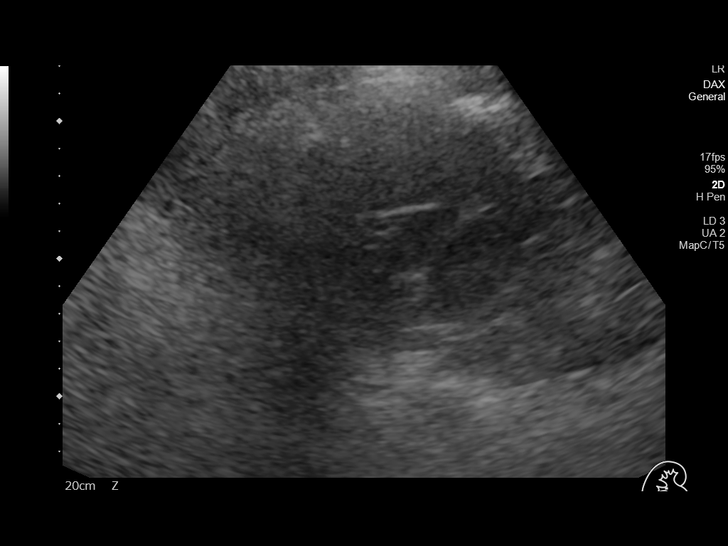
[im 16/63]
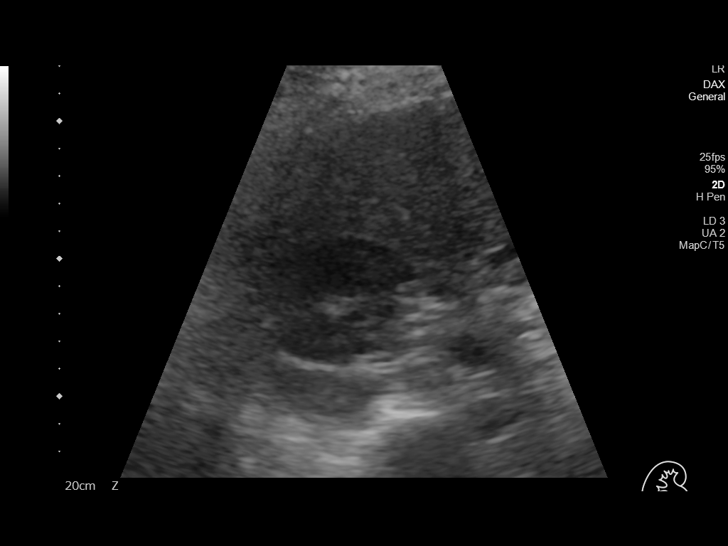
[im 21/63]
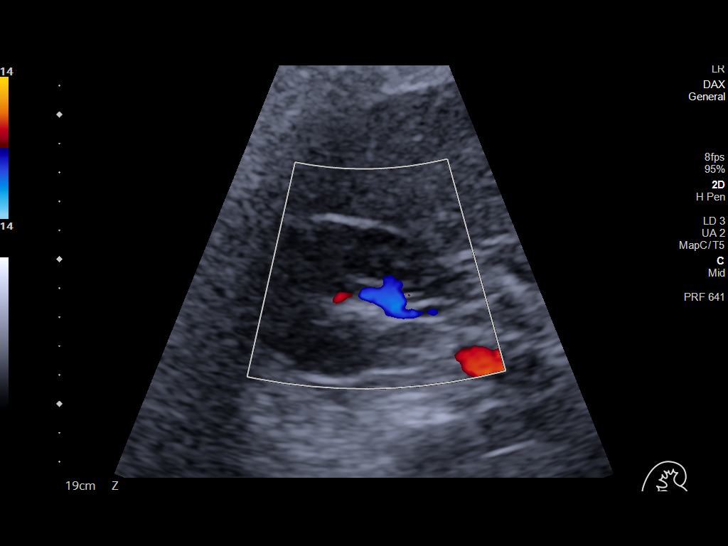
[im 24/63]
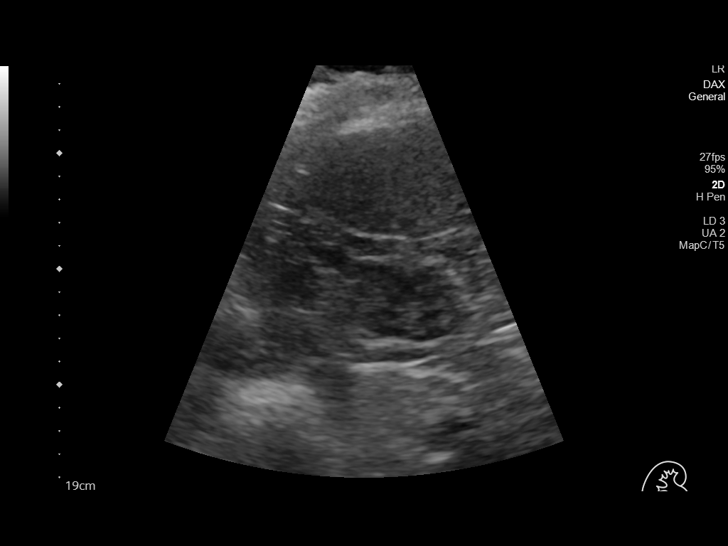
[im 29/63]
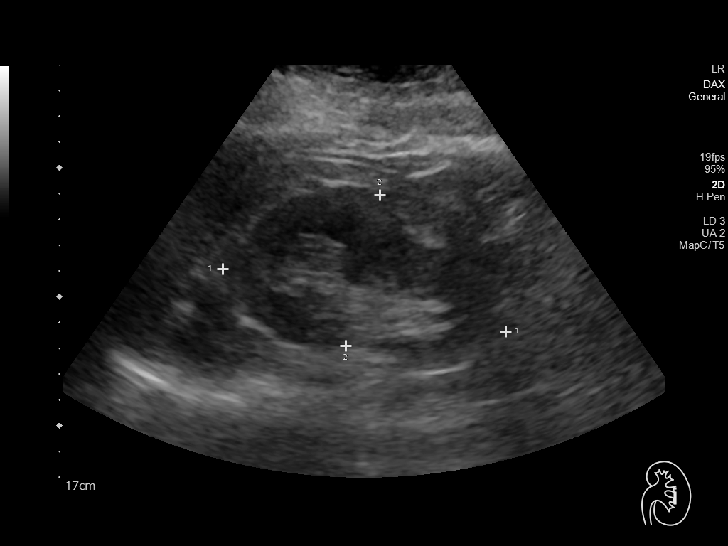
[im 34/63]
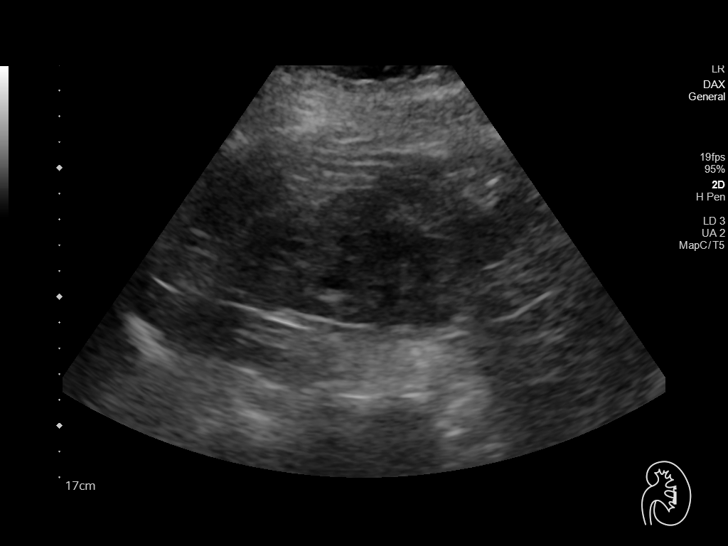
[im 39/63]
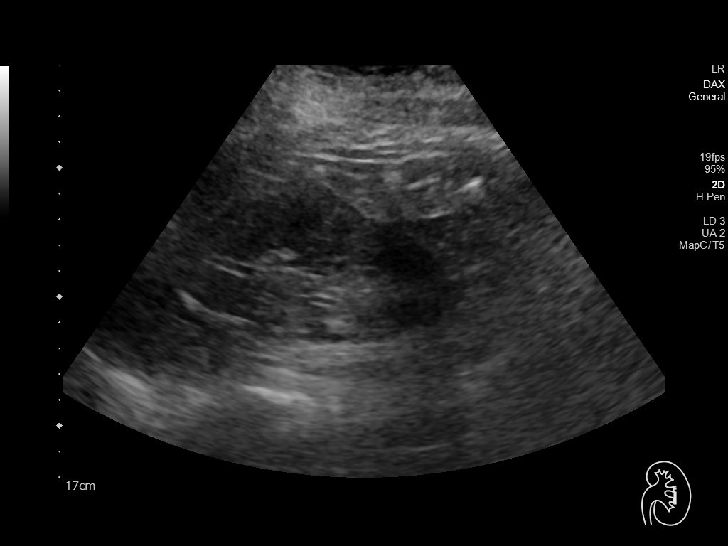
[im 42/63]
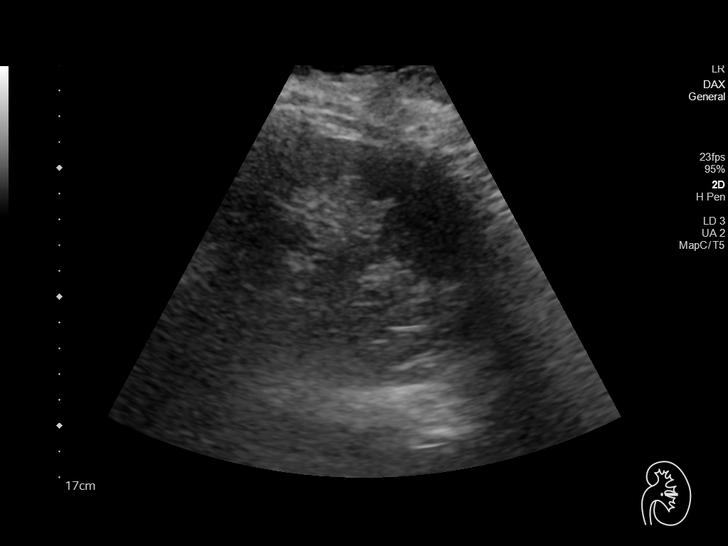
[im 47/63]
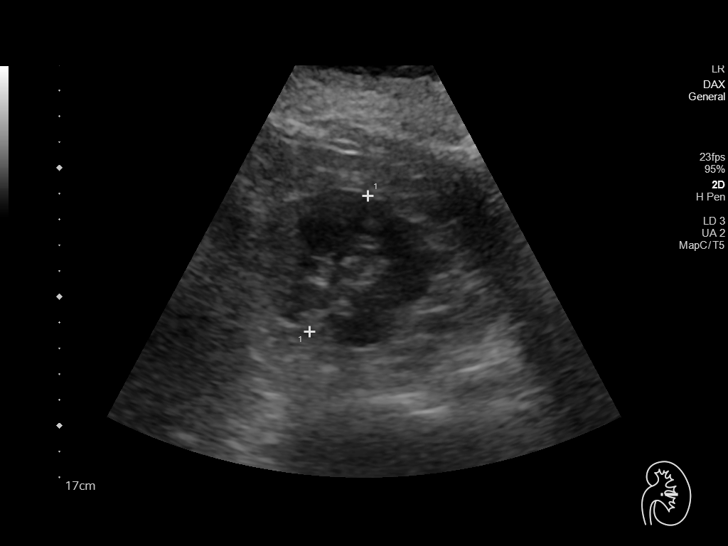
[im 52/63]
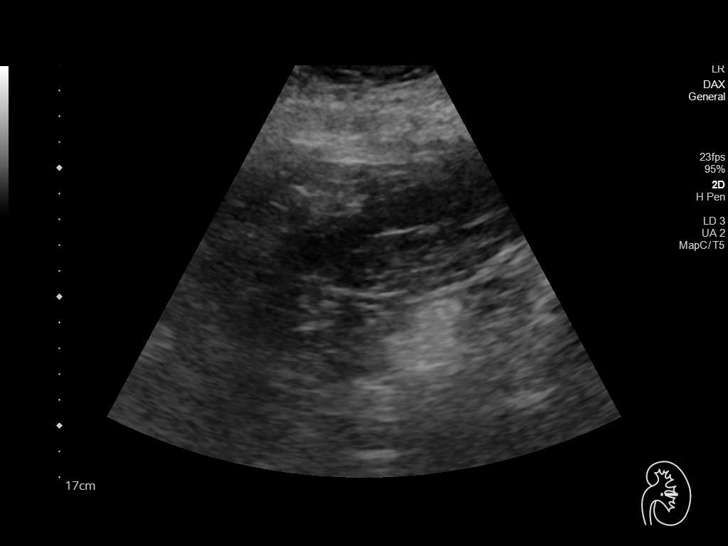
[im 57/63]
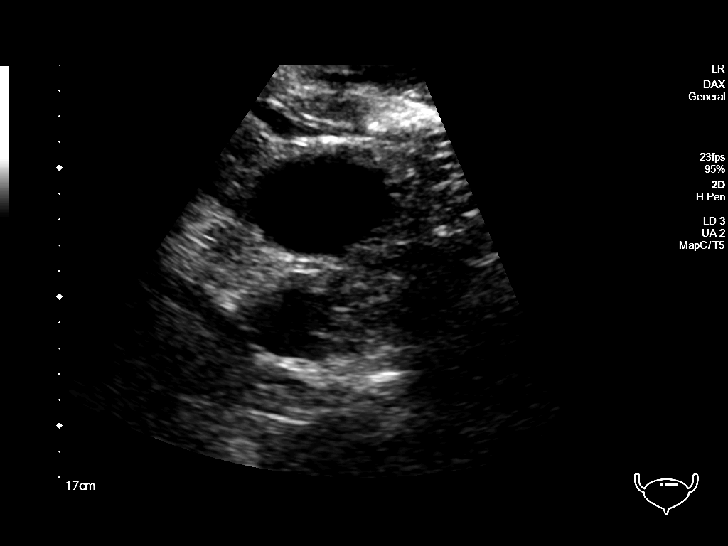
[im 63/63]
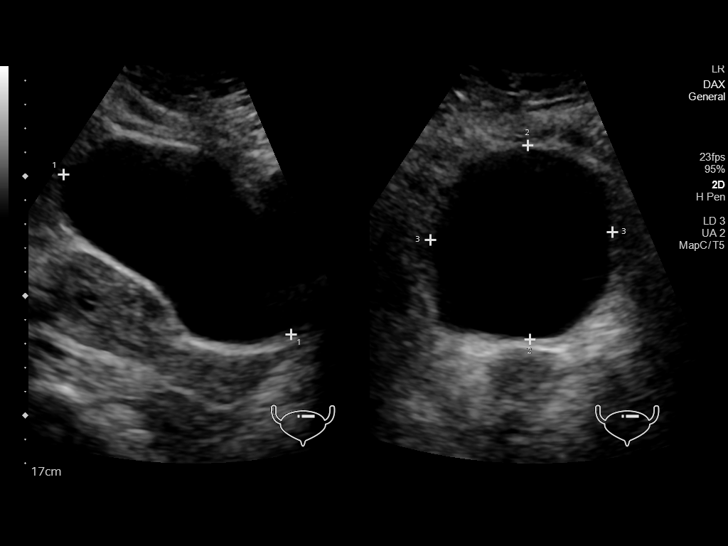

[14 of 25 positions shown; findings below may reference images not displayed]

FINDINGS: Right Kidney:

Renal measurements: 12.6 x 5.4 x 6.4 cm = volume: 224 mL .
Echogenicity within normal limits. No mass or hydronephrosis
visualized.

Left Kidney:

Renal measurements: 11.2 x 6.0 x 5.7 cm = volume: 202 mL.
Echogenicity within normal limits. No mass or hydronephrosis
visualized.

Bladder:

Appears normal for degree of bladder distention.

Other:

None.
IMPRESSION: Normal renal ultrasound.

## 2020-01-22 MED ORDER — SODIUM CHLORIDE 0.9 % IV SOLN: INTRAVENOUS | Status: DC

## 2020-01-22 MED ORDER — ONDANSETRON HCL 4 MG PO TABS: 4.0000 mg | ORAL_TABLET | Freq: Four times a day (QID) | ORAL | Status: DC | PRN

## 2020-01-22 MED ORDER — SODIUM CHLORIDE 0.9% FLUSH
10.0000 mL | INTRAVENOUS | Status: DC | PRN
  Administered 2020-01-22: 10 mL via INTRAVENOUS
  Filled 2020-01-22: qty 10

## 2020-01-22 MED ORDER — HYDROCODONE-ACETAMINOPHEN 5-325 MG PO TABS
1.0000 | ORAL_TABLET | Freq: Four times a day (QID) | ORAL | Status: DC | PRN
  Administered 2020-01-26: 1 via ORAL
  Filled 2020-01-22: qty 1

## 2020-01-22 MED ORDER — ACETAMINOPHEN 650 MG RE SUPP: 650.0000 mg | Freq: Four times a day (QID) | RECTAL | Status: DC | PRN

## 2020-01-22 MED ORDER — ACETAMINOPHEN 325 MG PO TABS
650.0000 mg | ORAL_TABLET | Freq: Four times a day (QID) | ORAL | Status: DC | PRN
  Administered 2020-01-22 – 2020-01-23 (×2): 650 mg via ORAL
  Filled 2020-01-22 (×2): qty 2

## 2020-01-22 MED ORDER — ONDANSETRON HCL 4 MG/2ML IJ SOLN
4.0000 mg | Freq: Four times a day (QID) | INTRAMUSCULAR | Status: DC | PRN
  Administered 2020-01-25: 4 mg via INTRAVENOUS
  Filled 2020-01-22: qty 2

## 2020-01-22 MED ORDER — STERILE WATER FOR INJECTION IV SOLN
INTRAVENOUS | Status: DC
  Filled 2020-01-22 (×4): qty 850

## 2020-01-22 MED ORDER — SODIUM CHLORIDE 0.9 % IV SOLN
Freq: Once | INTRAVENOUS | Status: AC
  Filled 2020-01-22: qty 250

## 2020-01-22 MED ORDER — METOPROLOL TARTRATE 50 MG PO TABS
50.0000 mg | ORAL_TABLET | Freq: Two times a day (BID) | ORAL | Status: DC
  Administered 2020-01-22: 50 mg via ORAL
  Filled 2020-01-22 (×2): qty 1

## 2020-01-22 MED ORDER — HEPARIN SODIUM (PORCINE) 5000 UNIT/ML IJ SOLN
5000.0000 [IU] | Freq: Three times a day (TID) | INTRAMUSCULAR | Status: DC
  Administered 2020-01-22 – 2020-01-28 (×19): 5000 [IU] via SUBCUTANEOUS
  Filled 2020-01-22 (×19): qty 1

## 2020-01-22 NOTE — Progress Notes (Signed)
Received report from Ronalee Belts, [place] in the ED.  Awaiting patient's transport to room 1614.

## 2020-01-22 NOTE — Progress Notes (Signed)
   01/22/20 1719  Vitals  Temp (!) 95.8 F (35.4 C)  Temp Source Axillary  BP 97/62  MAP (mmHg) 74  BP Location Right Arm  BP Method Automatic  Patient Position (if appropriate) Lying  Pulse Rate 75  Resp 15  Level of Consciousness  Level of Consciousness Alert  Oxygen Therapy  SpO2 100 %  O2 Device Room Air  Glasgow Coma Scale  Eye Opening 4  Best Verbal Response (NON-intubated) 5  Best Motor Response 6  Glasgow Coma Scale Score 15  MEWS Score  MEWS Temp 1  MEWS Systolic 1  MEWS Pulse 0  MEWS RR 0  MEWS LOC 0  MEWS Score 2  MEWS Score Color Yellow   Pt BP and temp do not reflect a significant change.  Pt resting comfortably in bed with no current complaints.  MD notified.

## 2020-01-22 NOTE — Progress Notes (Signed)
[facility] Progress Note   [name] [id number] 1955/11/07 64 y.o.  [name] is managed by Dr. Lurline Del  Actively treated with chemotherapy/immunotherapy/hormonal therapy: [name] was scheduled to begin adjuvant chemotherapy with cyclophosphamide and doxorubicin in dose dense fashion today.  Next scheduled appointment with provider: 01/30/2020  Assessment: Plan:    Malignant neoplasm of upper-outer quadrant of left breast in female, estrogen receptor positive ([place])  Idiopathic hypotension  Acute renal failure, unspecified acute renal failure type ([place])   pT2 pN1, stage IIB invasive ductal carcinoma, grade 3, estrogen and progesterone receptor positive, HER-2 not amplified: [name] is managed by Dr. Jana Hakim and was scheduled to begin adjuvant chemotherapy with cyclophosphamide and doxorubicin in dose dense fashion today. This was held given her hypotension and acute renal failure. She is scheduled to be seen in follow up on 01/30/2020.  Hypotension: The patient was noted to have an initial BP of 71/45. She was given a bolus of 500 ml of normal saline with a repeat BP of 95/68.  Acute renal failure: [name] has a history of renal insuffiencey with her last creatinine from 2 weeks ago measuring 1.48.  Today's creatinine returned at 6.02 with a BUN of 74.  She was taken to the emergency room for evaluation and management of her acute renal failure.  Please see After Visit Summary for patient specific instructions.  Future Appointments  Date Time Provider [place]  01/24/2020  8:45 AM [facility] [place] None  01/25/2020 11:15 AM Madduri, Myrtie Hawk, MD [facility]  01/30/2020 11:00 AM [facility] 1 [facility] None  01/30/2020 11:30 AM Causey, Charlestine Massed, NP [facility] None  02/01/2020 11:00 AM Ricard Dillon, MD [facility]  02/05/2020  8:30 AM Magrinat, Virgie Dad, MD [facility] None  02/05/2020  9:15 AM  [facility] LAB 2 [facility] None  02/05/2020  9:30 AM [facility] [place] FLUSH [facility] None  02/05/2020 10:30 AM [facility] INFUSION [facility] None  02/07/2020  8:15 AM [facility] [place] FLUSH [facility] None  02/19/2020  9:00 AM [facility] LAB 3 [facility] None  02/19/2020  9:15 AM [facility] FLUSH [facility] None  02/19/2020 10:00 AM [facility] INFUSION [facility] None  02/21/2020  8:15 AM [facility] FLUSH [facility] None  03/04/2020  9:00 AM [facility] LAB 3 [facility] None  03/04/2020  9:15 AM [facility] [place] FLUSH [facility] None  03/04/2020 10:00 AM [facility] INFUSION [facility] None  03/06/2020  8:30 AM [facility] [place] FLUSH [facility] None    No orders of the defined types were placed in this encounter.      Subjective:   Patient ID:  [name] is a 64 y.o. (DOB 1956-06-03) female.  Chief Complaint: No chief complaint on file.   HPI [name]  is a 64 y.o. female with a diagnosis of a pT2 pN1, stage IIB invasive ductal carcinoma, grade 3, estrogen and progesterone receptor positive, HER-2 not amplified. She is managed by Dr. Jana Hakim and was scheduled to begin adjuvant chemotherapy with cyclophosphamide and doxorubicin in dose dense fashion today.  When she presented for treatment today she was found to be hypotensive with an initial BP of 71/45. She was given a bolus of 500 ml of normal saline with a repeat BP returning at 95/68.  She has a history of renal insufficiency with her last creatinine from 2 weeks ago measuring 1.48.  Today's creatinine returned at 6.02 with a BUN of 74.  She reports that she has been eating and drinking but as been in  taking less recently.  Her only new issue of concern is for bilateral lower extremity lymphedema for which she has been seen at the lymphedema clinic.  Medications: I have reviewed the patient's current medications.  Allergies:  Allergies  Allergen Reactions  . Amlodipine Swelling    BLE edema    Past Medical History:  Diagnosis  Date  . Arthritis   . Cancer ([place]) 11/2019   Left breast  . Diverticulitis   . Family history of ovarian cancer   . Heart murmur    11/26/19 echo: Mild AS. AV mean gradient 12.0 mmHg; however, LVOT gradient 8 mmHg with intracvitary gradient-significant AS is not suspected  . Hypertension     Past Surgical History:  Procedure Laterality Date  . MASTECTOMY WITH AXILLARY LYMPH NODE DISSECTION Left 12/19/2019   Procedure: LEFT MASTECTOMY WITH AXILLARY LYMPH NODE DISSECTION;  Surgeon: [name], MD;  Location: [place];  Service: General;  Laterality: Left;  Marland Kitchen MULTIPLE EXTRACTIONS WITH ALVEOLOPLASTY Bilateral 12/14/2019   Procedure: MULTIPLE EXTRACTION WITH ALVEOLOPLASTY;  Surgeon: [name], DDS;  Location: [place];  Service: Oral Surgery;  Laterality: Bilateral;  . PORTACATH PLACEMENT N/A 12/19/2019   Procedure: INSERTION PORT-A-CATH WITH ULTRASOUND GUIDANCE;  Surgeon: [name], MD;  Location: [place];  Service: General;  Laterality: N/A;  . RADIAL HEAD ARTHROPLASTY Right 06/25/2019   Procedure: RIGHT RADIAL HEAD ARTHROPLASTY;  Surgeon: [name], MD;  Location: [place];  Service: Orthopedics;  Laterality: Right;  . TUBAL LIGATION      Family History  Problem Relation Age of Onset  . Hypertension Mother   . Dementia Mother   . Ovarian cancer Half-Sister 12  . Diabetes Half-Sister   . Stroke Half-Sister     Social History   Socioeconomic History  . Marital status: Legally Separated    Spouse name: Not on file  . Number of children: Not on file  . Years of education: Not on file  . Highest education level: Not on file  Occupational History  . Not on file  Tobacco Use  . Smoking status: Never Smoker  . Smokeless tobacco: Never Used  Substance and Sexual Activity  . Alcohol use: Yes    Alcohol/week: 3.0 standard drinks    Types: 1 Cans of beer, 2 Shots of liquor per week    Comment: daily  . Drug use: Yes    Types: Marijuana  . Sexual activity: Not Currently    Other Topics Concern  . Not on file  Social History Narrative  . Not on file   Social Determinants of Health   Financial Resource Strain:   . Difficulty of Paying Living Expenses:   Food Insecurity:   . Worried About Charity fundraiser in the Last Year:   . Arboriculturist in the Last Year:   Transportation Needs:   . Film/video editor (Medical):   Marland Kitchen Lack of Transportation (Non-Medical):   Physical Activity:   . Days of Exercise per Week:   . Minutes of Exercise per Session:   Stress:   . Feeling of Stress :   Social Connections:   . Frequency of Communication with Friends and Family:   . Frequency of Social Gatherings with Friends and Family:   . Attends Religious Services:   . Active Member of Clubs or Organizations:   . Attends Archivist Meetings:   Marland Kitchen Marital Status:   Intimate Partner Violence:   . Fear of Current or Ex-Partner:   .  Emotionally Abused:   Marland Kitchen Physically Abused:   . Sexually Abused:     Past Medical History, Surgical history, Social history, and Family history were reviewed and updated as appropriate.   Please see review of systems for further details on the patient's review from today.   Review of Systems:  Review of Systems  Constitutional: Positive for appetite change. Negative for chills, diaphoresis and fever.  HENT: Negative for trouble swallowing and voice change.   Respiratory: Negative for cough, chest tightness, shortness of breath and wheezing.   Cardiovascular: Positive for leg swelling. Negative for chest pain and palpitations.  Gastrointestinal: Negative for abdominal pain, constipation, diarrhea, nausea and vomiting.  Musculoskeletal: Positive for gait problem. Negative for back pain and myalgias.  Neurological: Negative for dizziness, light-headedness and headaches.    Objective:   Physical Exam:  There were no vitals taken for this visit. ECOG: 2  Physical Exam Constitutional:      General: She is not in  acute distress.    Appearance: She is not diaphoretic.  HENT:     Head: Normocephalic and atraumatic.  Eyes:     General:        Right eye: No discharge.        Left eye: No discharge.     Conjunctiva/sclera: Conjunctivae normal.  Cardiovascular:     Rate and Rhythm: Normal rate and regular rhythm.     Heart sounds: Normal heart sounds. No murmur. No friction rub. No gallop.   Pulmonary:     Effort: Pulmonary effort is normal. No respiratory distress.     Breath sounds: Normal breath sounds. No wheezing or rales.  Abdominal:     General: Bowel sounds are normal.     Tenderness: There is no abdominal tenderness. There is no guarding.  Musculoskeletal:     Right lower leg: Edema present.     Left lower leg: Edema present.  Skin:    General: Skin is warm and dry.     Findings: No erythema or rash.  Neurological:     Mental Status: She is alert.     Gait: Gait abnormal (The patient is ambulating with the use of a wheelchair.).     Lab Review:     Component Value Date/Time   NA 119 (L) 01/22/2020 0840   NA 135 01/02/2020 1710   K 4.8 01/22/2020 0840   CL 89 (L) 01/22/2020 0840   CO2 16 (L) 01/22/2020 0840   GLUCOSE 119 (H) 01/22/2020 0840   BUN 74 (H) 01/22/2020 0840   BUN 25 01/02/2020 1710   CREATININE 6.02 (HH) 01/22/2020 0840   CREATININE 1.59 (H) 11/21/2019 0908   CALCIUM 8.4 (L) 01/22/2020 0840   PROT 7.2 01/22/2020 0840   PROT 8.5 06/15/2019 1622   ALBUMIN 2.7 (L) 01/22/2020 0840   ALBUMIN 4.3 06/15/2019 1622   AST 35 01/22/2020 0840   AST 61 (H) 11/21/2019 0908   ALT 34 01/22/2020 0840   ALT 32 11/21/2019 0908   ALKPHOS 62 01/22/2020 0840   BILITOT 0.2 (L) 01/22/2020 0840   BILITOT 0.5 11/21/2019 0908   GFRNONAA 7 (L) 01/22/2020 0840   GFRNONAA 34 (L) 11/21/2019 0908   GFRAA 8 (L) 01/22/2020 0840   GFRAA 39 (L) 11/21/2019 0908       Component Value Date/Time   WBC 6.3 01/22/2020 0840   RBC 2.58 (L) 01/22/2020 0840   HGB 8.4 (L) 01/22/2020 0840    HGB 9.6 (L) 01/02/2020 1710   HCT  23.8 (L) 01/22/2020 0840   HCT 29.2 (L) 01/02/2020 1710   PLT 210 01/22/2020 0840   PLT 268 01/02/2020 1710   MCV 92.2 01/22/2020 0840   MCV 97 01/02/2020 1710   MCH 32.6 01/22/2020 0840   MCHC 35.3 01/22/2020 0840   RDW 12.2 01/22/2020 0840   RDW 12.4 01/02/2020 1710   LYMPHSABS 1.4 01/22/2020 0840   LYMPHSABS 2.5 01/02/2020 1710   MONOABS 0.4 01/22/2020 0840   EOSABS 1.6 (H) 01/22/2020 0840   EOSABS 0.2 01/02/2020 1710   BASOSABS 0.0 01/22/2020 0840   BASOSABS 0.0 01/02/2020 1710   -------------------------------  Imaging from last 24 hours (if applicable):  Radiology interpretation: No results found.

## 2020-01-22 NOTE — Patient Instructions (Signed)

## 2020-01-22 NOTE — ED Triage Notes (Signed)
Brought from [facility] for AKI, Lab results today Creat 6.02. Hx of Met breast Ca. Reported intial hypotension, given 525m NS Pressure prior to arrival 95/68.

## 2020-01-22 NOTE — Telephone Encounter (Signed)
This RN per MD review by treatment room nurse of bp of 71/45 with recheck of 83/57 gave orders for 500 cc bolus of NS as fast as tolerated.  Recheck BP and inform MD.  Pt is to hold other BP meds presently - including metoprolol, benzapril and HCTZ.

## 2020-01-22 NOTE — Patient Instructions (Signed)
[name]charge Instructions for Patients Receiving Chemotherapy  Today you received the following chemotherapy agents adriamycin/cytoxan   To help prevent nausea and vomiting after your treatment, we encourage you to take your nausea medication as directed.  DO NOT TAKE ZOFRAN FOR 3 DAYS AFTER TREATMENT   If you develop nausea and vomiting that is not controlled by your nausea medication, call the clinic.   BELOW ARE SYMPTOMS THAT SHOULD BE REPORTED IMMEDIATELY:  *FEVER GREATER THAN 100.5 F  *CHILLS WITH OR WITHOUT FEVER  NAUSEA AND VOMITING THAT IS NOT CONTROLLED WITH YOUR NAUSEA MEDICATION  *UNUSUAL SHORTNESS OF BREATH  *UNUSUAL BRUISING OR BLEEDING  TENDERNESS IN MOUTH AND THROAT WITH OR WITHOUT PRESENCE OF ULCERS  *URINARY PROBLEMS  *BOWEL PROBLEMS  UNUSUAL RASH Items with * indicate a potential emergency and should be followed up as soon as possible.  Feel free to call the clinic you have any questions or concerns. The clinic phone number is [phone].  Doxorubicin injection What is this medicine? DOXORUBICIN (dox oh ROO bi sin) is a chemotherapy drug. It is used to treat many kinds of cancer like leukemia, lymphoma, neuroblastoma, sarcoma, and Wilms' tumor. It is also used to treat bladder cancer, breast cancer, lung cancer, ovarian cancer, stomach cancer, and thyroid cancer. This medicine may be used for other purposes; ask your health care provider or pharmacist if you have questions. COMMON BRAND NAME(S): Adriamycin, Adriamycin PFS, Adriamycin RDF, Rubex What should I tell my health care provider before I take this medicine? They need to know if you have any of these conditions:  heart disease  history of low blood counts caused by a medicine  liver disease  recent or ongoing radiation therapy  an unusual or allergic reaction to doxorubicin, other chemotherapy agents, other medicines, foods, dyes, or preservatives  pregnant or  trying to get pregnant  breast-feeding How should I use this medicine? This drug is given as an infusion into a vein. It is administered in a hospital or clinic by a specially trained health care professional. If you have pain, swelling, burning or any unusual feeling around the site of your injection, tell your health care professional right away. Talk to your pediatrician regarding the use of this medicine in children. Special care may be needed. Overdosage: If you think you have taken too much of this medicine contact a poison control center or emergency room at once. NOTE: This medicine is only for you. Do not share this medicine with others. What if I miss a dose? It is important not to miss your dose. Call your doctor or health care professional if you are unable to keep an appointment. What may interact with this medicine? This medicine may interact with the following medications:  6-mercaptopurine  paclitaxel  phenytoin  St. John's Wort  trastuzumab  verapamil This list may not describe all possible interactions. Give your health care provider a list of all the medicines, herbs, non-prescription drugs, or dietary supplements you use. Also tell them if you smoke, drink alcohol, or use illegal drugs. Some items may interact with your medicine. What should I watch for while using this medicine? This drug may make you feel generally unwell. This is not uncommon, as chemotherapy can affect healthy cells as well as cancer cells. Report any side effects. Continue your course of treatment even though you feel ill unless your doctor tells you to stop. There is a maximum amount of this medicine you should receive throughout your  life. The amount depends on the medical condition being treated and your overall health. Your doctor will watch how much of this medicine you receive in your lifetime. Tell your doctor if you have taken this medicine before. You may need blood work done while you  are taking this medicine. Your urine may turn red for a few days after your dose. This is not blood. If your urine is dark or brown, call your doctor. In some cases, you may be given additional medicines to help with side effects. Follow all directions for their use. Call your doctor or health care professional for advice if you get a fever, chills or sore throat, or other symptoms of a cold or flu. Do not treat yourself. This drug decreases your body's ability to fight infections. Try to avoid being around people who are sick. This medicine may increase your risk to bruise or bleed. Call your doctor or health care professional if you notice any unusual bleeding. Talk to your doctor about your risk of cancer. You may be more at risk for certain types of cancers if you take this medicine. Do not become pregnant while taking this medicine or for 6 months after stopping it. Women should inform their doctor if they wish to become pregnant or think they might be pregnant. Men should not father a child while taking this medicine and for 6 months after stopping it. There is a potential for serious side effects to an unborn child. Talk to your health care professional or pharmacist for more information. Do not breast-feed an infant while taking this medicine. This medicine has caused ovarian failure in some women and reduced sperm counts in some men This medicine may interfere with the ability to have a child. Talk with your doctor or health care professional if you are concerned about your fertility. This medicine may cause a decrease in Co-Enzyme Q-10. You should make sure that you get enough Co-Enzyme Q-10 while you are taking this medicine. Discuss the foods you eat and the vitamins you take with your health care professional. What side effects may I notice from receiving this medicine? Side effects that you should report to your doctor or health care professional as soon as possible:  allergic reactions  like skin rash, itching or hives, swelling of the face, lips, or tongue  breathing problems  chest pain  fast or irregular heartbeat  low blood counts - this medicine may decrease the number of white blood cells, red blood cells and platelets. You may be at increased risk for infections and bleeding.  pain, redness, or irritation at site where injected  signs of infection - fever or chills, cough, sore throat, pain or difficulty passing urine  signs of decreased platelets or bleeding - bruising, pinpoint red spots on the skin, black, tarry stools, blood in the urine  swelling of the ankles, feet, hands  tiredness  weakness Side effects that usually do not require medical attention (report to your doctor or health care professional if they continue or are bothersome):  diarrhea  hair loss  mouth sores  nail discoloration or damage  nausea  red colored urine  vomiting This list may not describe all possible side effects. Call your doctor for medical advice about side effects. You may report side effects to FDA at 1-800-FDA-1088. Where should I keep my medicine? This drug is given in a hospital or clinic and will not be stored at home. NOTE: This sheet is a summary. It may  not cover all possible information. If you have questions about this medicine, talk to your doctor, pharmacist, or health care provider.  2020 [employer] Standard (2017-03-30 11:01:26)   Cyclophosphamide Injection What is this medicine? CYCLOPHOSPHAMIDE (sye kloe FOSS fa mide) is a chemotherapy drug. It slows the growth of cancer cells. This medicine is used to treat many types of cancer like lymphoma, myeloma, leukemia, breast cancer, and ovarian cancer, to name a few. This medicine may be used for other purposes; ask your health care provider or pharmacist if you have questions. COMMON BRAND NAME(S): Cytoxan, Neosar What should I tell my health care provider before I take this medicine? They need  to know if you have any of these conditions:  heart disease  history of irregular heartbeat  infection  kidney disease  liver disease  low blood counts, like white cells, platelets, or red blood cells  on hemodialysis  recent or ongoing radiation therapy  scarring or thickening of the lungs  trouble passing urine  an unusual or allergic reaction to cyclophosphamide, other medicines, foods, dyes, or preservatives  pregnant or trying to get pregnant  breast-feeding How should I use this medicine? This drug is usually given as an injection into a vein or muscle or by infusion into a vein. It is administered in a hospital or clinic by a specially trained health care professional. Talk to your pediatrician regarding the use of this medicine in children. Special care may be needed. Overdosage: If you think you have taken too much of this medicine contact a poison control center or emergency room at once. NOTE: This medicine is only for you. Do not share this medicine with others. What if I miss a dose? It is important not to miss your dose. Call your doctor or health care professional if you are unable to keep an appointment. What may interact with this medicine?  amphotericin B  azathioprine  certain antivirals for HIV or hepatitis  certain medicines for blood pressure, heart disease, irregular heart beat  certain medicines that treat or prevent blood clots like warfarin  certain other medicines for cancer  cyclosporine  etanercept  indomethacin  medicines that relax muscles for surgery  medicines to increase blood counts  metronidazole This list may not describe all possible interactions. Give your health care provider a list of all the medicines, herbs, non-prescription drugs, or dietary supplements you use. Also tell them if you smoke, drink alcohol, or use illegal drugs. Some items may interact with your medicine. What should I watch for while using this  medicine? Your condition will be monitored carefully while you are receiving this medicine. You may need blood work done while you are taking this medicine. Drink water or other fluids as directed. Urinate often, even at night. Some products may contain alcohol. Ask your health care professional if this medicine contains alcohol. Be sure to tell all health care professionals you are taking this medicine. Certain medicines, like metronidazole and disulfiram, can cause an unpleasant reaction when taken with alcohol. The reaction includes flushing, headache, nausea, vomiting, sweating, and increased thirst. The reaction can last from 30 minutes to several hours. Do not become pregnant while taking this medicine or for 1 year after stopping it. Women should inform their health care professional if they wish to become pregnant or think they might be pregnant. Men should not father a child while taking this medicine and for 4 months after stopping it. There is potential for serious side effects to an  unborn child. Talk to your health care professional for more information. Do not breast-feed an infant while taking this medicine or for 1 week after stopping it. This medicine has caused ovarian failure in some women. This medicine may make it more difficult to get pregnant. Talk to your health care professional if you are concerned about your fertility. This medicine has caused decreased sperm counts in some men. This may make it more difficult to father a child. Talk to your health care professional if you are concerned about your fertility. Call your health care professional for advice if you get a fever, chills, or sore throat, or other symptoms of a cold or flu. Do not treat yourself. This medicine decreases your body's ability to fight infections. Try to avoid being around people who are sick. Avoid taking medicines that contain aspirin, acetaminophen, ibuprofen, naproxen, or ketoprofen unless instructed by  your health care professional. These medicines may hide a fever. Talk to your health care professional about your risk of cancer. You may be more at risk for certain types of cancer if you take this medicine. If you are going to need surgery or other procedure, tell your health care professional that you are using this medicine. Be careful brushing or flossing your teeth or using a toothpick because you may get an infection or bleed more easily. If you have any dental work done, tell your dentist you are receiving this medicine. What side effects may I notice from receiving this medicine? Side effects that you should report to your doctor or health care professional as soon as possible:  allergic reactions like skin rash, itching or hives, swelling of the face, lips, or tongue  breathing problems  nausea, vomiting  signs and symptoms of bleeding such as bloody or black, tarry stools; red or dark brown urine; spitting up blood or brown material that looks like coffee grounds; red spots on the skin; unusual bruising or bleeding from the eyes, gums, or nose  signs and symptoms of heart failure like fast, irregular heartbeat, sudden weight gain; swelling of the ankles, feet, hands  signs and symptoms of infection like fever; chills; cough; sore throat; pain or trouble passing urine  signs and symptoms of kidney injury like trouble passing urine or change in the amount of urine  signs and symptoms of liver injury like dark yellow or brown urine; general ill feeling or flu-like symptoms; light-colored stools; loss of appetite; nausea; right upper belly pain; unusually weak or tired; yellowing of the eyes or skin Side effects that usually do not require medical attention (report to your doctor or health care professional if they continue or are bothersome):  confusion  decreased hearing  diarrhea  facial flushing  hair loss  headache  loss of appetite  missed menstrual periods  signs  and symptoms of low red blood cells or anemia such as unusually weak or tired; feeling faint or lightheaded; falls  skin discoloration This list may not describe all possible side effects. Call your doctor for medical advice about side effects. You may report side effects to FDA at 1-800-FDA-1088. Where should I keep my medicine? This drug is given in a hospital or clinic and will not be stored at home. NOTE: This sheet is a summary. It may not cover all possible information. If you have questions about this medicine, talk to your doctor, pharmacist, or health care provider.  2020 [employer]/Gold Standard (2019-05-21 09:53:29)

## 2020-01-22 NOTE — H&P (Signed)
History and Physical    [name] FFM:[id number] DOB: 1956-01-22 DOA: 01/22/2020  PCP: [name], MD  Patient coming from: Home/cancer Center  I have personally briefly reviewed patient's old medical records in [place]  Chief Complaint: Low blood pressure/AKI  HPI: [name] is a 64 y.o. female with medical history significant of left breast cancer status post mastectomy, diverticulitis, hypertension and CKD stage IIIa was sent to the emergency department from [place] where she presented today for her first chemo and apparently, she was having low blood pressure.  She was given IV fluids which improved her blood pressure however labs were done which showed significantly elevated creatinine so she was diagnosed with acute on chronic kidney disease.  Patient had no symptoms such as chest pain, headache, dizziness, any problem with urination or with bowel movements.  Upon further questioning, patient endorses that she drinks only 3 bottles of 12 ounce water every day and she is on several nephrotoxic agents including benazepril, hydrochlorothiazide, NSAIDs and she was also on Lasix which was stopped by a physician about a week ago.  ED Course: Upon arrival to ED, her blood pressure was fairly normal at 112/71.  There is one reading of 41/32 that is an error.  Patient is asymptomatic.  BMP was repeated which showed creatinine of 6.02 with sodium of 119.  EDP already consulted nephrology and then called hospital service for admission.  Review of Systems: As per HPI otherwise negative.    Past Medical History:  Diagnosis Date  . Arthritis   . Cancer ([place]) 11/2019   Left breast  . Diverticulitis   . Family history of ovarian cancer   . Heart murmur    11/26/19 echo: Mild AS. AV mean gradient 12.0 mmHg; however, LVOT gradient 8 mmHg with intracvitary gradient-significant AS is not suspected  . Hypertension     Past Surgical History:  Procedure Laterality Date  .  MASTECTOMY WITH AXILLARY LYMPH NODE DISSECTION Left 12/19/2019   Procedure: LEFT MASTECTOMY WITH AXILLARY LYMPH NODE DISSECTION;  Surgeon: [name], MD;  Location: [facility];  Service: General;  Laterality: Left;  Marland Kitchen MULTIPLE EXTRACTIONS WITH ALVEOLOPLASTY Bilateral 12/14/2019   Procedure: MULTIPLE EXTRACTION WITH ALVEOLOPLASTY;  Surgeon: [name], DDS;  Location: [facility];  Service: Oral Surgery;  Laterality: Bilateral;  . PORTACATH PLACEMENT N/A 12/19/2019   Procedure: INSERTION PORT-A-CATH WITH ULTRASOUND GUIDANCE;  Surgeon: [name], MD;  Location: [facility];  Service: General;  Laterality: N/A;  . RADIAL HEAD ARTHROPLASTY Right 06/25/2019   Procedure: RIGHT RADIAL HEAD ARTHROPLASTY;  Surgeon: [name], MD;  Location: [facility];  Service: Orthopedics;  Laterality: Right;  . TUBAL LIGATION       reports that she has never smoked. She has never used smokeless tobacco. She reports current alcohol use of about 3.0 standard drinks of alcohol per week. She reports current drug use. Drug: Marijuana.  Allergies  Allergen Reactions  . Amlodipine Swelling    BLE edema    Family History  Problem Relation Age of Onset  . Hypertension Mother   . Dementia Mother   . Ovarian cancer Half-Sister 74  . Diabetes Half-Sister   . Stroke Half-Sister     Prior to Admission medications   Medication Sig Start Date End Date Taking? Authorizing Provider  benazepril (LOTENSIN) 20 MG tablet TAKE 1 TABLET (20 MG TOTAL) BY MOUTH DAILY. TO LOWER BLOOD PRESSURE Patient taking differently: Take 20 mg by mouth daily.  11/05/19  Yes Fulp,  Cammie, MD  dexamethasone (DECADRON) 4 MG tablet Take1 tablet by mouth twice a day on the day after chemotherapy and the day after that, then the morning of day 4, then stop. Take with food. 01/17/20  Yes Magrinat, Virgie Dad, MD  hydrochlorothiazide (HYDRODIURIL) 25 MG tablet Take 1 tablet (25 mg total) by mouth daily. 10/17/19  Yes Fulp, Cammie, MD  HYDROcodone-Acetaminophen  5-300 MG TABS One every 6 hours as needed for pain; eat something before taking medication Patient taking differently: Take 1 tablet by mouth every 6 (six) hours as needed (pain).  01/02/20  Yes Fulp, Cammie, MD  ibuprofen (ADVIL) 600 MG tablet Take 1 tablet (600 mg total) by mouth every 8 (eight) hours as needed for moderate pain. Take after eating 01/02/20  Yes Fulp, Cammie, MD  KLOR-CON M20 20 MEQ tablet TAKE 1 TABLET BY MOUTH EVERY DAY Patient taking differently: Take 20 mEq by mouth daily.  09/07/19  Yes Fulp, Cammie, MD  lidocaine-prilocaine (EMLA) cream Apply to affected area once 01/17/20  Yes Magrinat, Virgie Dad, MD  meloxicam (MOBIC) 15 MG tablet Take 1 tablet (15 mg total) by mouth daily. 08/01/19  Yes Edrick Kins, DPM  metoprolol tartrate (LOPRESSOR) 50 MG tablet Take 1 tablet (50 mg total) by mouth 2 (two) times daily. 10/17/19  Yes Fulp, Cammie, MD  Multiple Vitamins-Minerals (CENTRUM SILVER ADULT 50+) TABS Take 1 tablet by mouth daily.    Yes [provider]  Multiple Vitamins-Minerals (HAIR SKIN AND NAILS FORMULA PO) Take 1 tablet by mouth daily at 2 PM.   Yes [provider]  prochlorperazine (COMPAZINE) 10 MG tablet Take one tablet 3 times a day before meals starting the evening of chemo and continuing for 3 days 01/17/20  Yes Magrinat, Virgie Dad, MD  amoxicillin (AMOXIL) 500 MG capsule Take 1 capsule (500 mg total) by mouth 3 (three) times daily. Patient not taking: Reported on 01/02/2020 12/14/19   [name], DDS  amoxicillin-clavulanate (AUGMENTIN) 500-125 MG tablet Take 1 tablet (500 mg total) by mouth 2 (two) times daily. Take after eating Patient not taking: Reported on 01/22/2020 01/02/20   [name], MD  Blood Pressure Monitoring ([employer] ADVANCED BP MONITOR) DEVI Measure blood pressure daily and record in diary 12/18/18   Elsie Stain, MD  ondansetron (ZOFRAN) 4 MG tablet Take 1-2 tablets (4-8 mg total) by mouth every 8 (eight) hours as needed for nausea or  vomiting. Patient not taking: Reported on 09/05/2019 06/25/19   [name], MD  oxyCODONE-acetaminophen (PERCOCET) 5-325 MG tablet Take 1 tablet by mouth every 4 (four) hours as needed. Patient not taking: Reported on 01/02/2020 12/14/19   [name], DDS    Physical Exam: Vitals:   01/22/20 1215 01/22/20 1230 01/22/20 1245 01/22/20 1300  BP: 107/72 (!) 102/56 110/60 110/60  Pulse: 73   73  Resp: 20 19 17 17   Temp:      TempSrc:      SpO2: 100%   100%    Constitutional: NAD, calm, comfortable Vitals:   01/22/20 1215 01/22/20 1230 01/22/20 1245 01/22/20 1300  BP: 107/72 (!) 102/56 110/60 110/60  Pulse: 73   73  Resp: 20 19 17 17   Temp:      TempSrc:      SpO2: 100%   100%   Eyes: PERRL, lids and conjunctivae normal ENMT: Mucous membranes are dry. Posterior pharynx clear of any exudate or lesions.Normal dentition.  Neck: normal, supple, no masses, no thyromegaly Respiratory: clear  to auscultation bilaterally, no wheezing, no crackles. Normal respiratory effort. No accessory muscle use.  Cardiovascular: Regular rate and rhythm, no murmurs / rubs / gallops.  Chronic bilateral lymphedema. 2+ pedal pulses. No carotid bruits.  Abdomen: no tenderness, no masses palpated. No hepatosplenomegaly. Bowel sounds positive.  Musculoskeletal: no clubbing / cyanosis. No joint deformity upper and lower extremities. Good ROM, no contractures. Normal muscle tone.  Skin: no rashes, lesions, ulcers. No induration Neurologic: CN 2-12 grossly intact. Sensation intact, DTR normal. Strength 5/5 in all 4.  Psychiatric: Normal judgment and insight. Alert and oriented x 3. Normal mood.    Labs on Admission: I have personally reviewed following labs and imaging studies  CBC: Recent Labs  Lab 01/22/20 0840  WBC 6.3  NEUTROABS 2.8  HGB 8.4*  HCT 23.8*  MCV 92.2  PLT 132   Basic Metabolic Panel: Recent Labs  Lab 01/22/20 0840  NA 119*  K 4.8  CL 89*  CO2 16*  GLUCOSE 119*  BUN 74*    CREATININE 6.02*  CALCIUM 8.4*   GFR: Estimated Creatinine Clearance: 12.5 mL/min (A) (by C-G formula based on SCr of 6.02 mg/dL [facility])). Liver Function Tests: Recent Labs  Lab 01/22/20 0840  AST 35  ALT 34  ALKPHOS 62  BILITOT 0.2*  PROT 7.2  ALBUMIN 2.7*   No results for input(s): LIPASE, AMYLASE in the last 168 hours. No results for input(s): AMMONIA in the last 168 hours. Coagulation Profile: No results for input(s): INR, PROTIME in the last 168 hours. Cardiac Enzymes: No results for input(s): CKTOTAL, CKMB, CKMBINDEX, TROPONINI in the last 168 hours. BNP (last 3 results) No results for input(s): PROBNP in the last 8760 hours. HbA1C: No results for input(s): HGBA1C in the last 72 hours. CBG: No results for input(s): GLUCAP in the last 168 hours. Lipid Profile: No results for input(s): CHOL, HDL, LDLCALC, TRIG, CHOLHDL, LDLDIRECT in the last 72 hours. Thyroid Function Tests: No results for input(s): TSH, T4TOTAL, FREET4, T3FREE, THYROIDAB in the last 72 hours. Anemia Panel: No results for input(s): VITAMINB12, FOLATE, FERRITIN, TIBC, IRON, RETICCTPCT in the last 72 hours. Urine analysis:    Component Value Date/Time   COLORURINE AMBER (A) 11/03/2018 1627   APPEARANCEUR HAZY (A) 11/03/2018 1627   LABSPEC 1.025 11/03/2018 1627   PHURINE 5.0 11/03/2018 1627   GLUCOSEU NEGATIVE 11/03/2018 1627   HGBUR NEGATIVE 11/03/2018 1627   BILIRUBINUR MODERATE (A) 11/03/2018 1627   KETONESUR 20 (A) 11/03/2018 1627   PROTEINUR 30 (A) 11/03/2018 1627   UROBILINOGEN 1.0 07/14/2012 1630   NITRITE NEGATIVE 11/03/2018 1627   LEUKOCYTESUR NEGATIVE 11/03/2018 1627    Radiological Exams on Admission: No results found.  EKG: Independently reviewed.  Sinus rhythm with no acute ST-T wave changes.  Incomplete left bundle branch block  Assessment/Plan Active Problems:   Essential hypertension   S/P left mastectomy   Acute renal failure (ARF) (HCC)   Acute hyponatremia    Acute on  chronic kidney disease stage IIIb: Her baseline creatinine seems to be between 1.2 and 1.3 with GFR between 50 and 55.  Current GFR is 8 with creatinine of 6.02.  This is likely due to dehydration/ATN.  I will hold all nephrotoxic agents which include benazepril, hydrochlorothiazide and all NSAIDs.  We will start her on hydration however she has history of grade 2 diastolic dysfunction so I will start her on normal saline at 100 cc/h.  Will defer further management to nephrology.  Acute hyponatremia: Sodium 119.  Likely hypovolemic hyponatremia.  Monitor every 6 hours while on normal saline.  History of hypertension now hypotensive: Patient's blood pressure is fairly normal.  We will hold hydrochlorothiazide and benazepril but continue metoprolol and monitor closely.  History of left breast cancer: Status post mastectomy.  She was at cancer center today to get her first chemo which was not initiated due to low blood pressure.  Oncology already aware of her admission.  DVT prophylaxis: Heparin subcutaneous Code Status: DNR, confirmed by patient Family Communication: None present at bedside.  Plan of care discussed with patient in length and he verbalized understanding and agreed with it. Disposition Plan: Likely discharge back to home in next 2 to 3 days Consults called: Nephrology, Dr. Justin Mend Admission status: Inpatient   Status is: Inpatient  Remains inpatient appropriate because:Inpatient level of care appropriate due to severity of illness   Dispo: The patient is from: Home              Anticipated d/c is to: Home              Anticipated d/c date is: 3 days              Patient currently is not medically stable to d/c.        Darliss Cheney MD [facility]  01/22/2020, 1:30 PM  To contact the attending provider between 7A-7P or the covering provider during after hours 7P-7A, please log into the web site [email or web address]

## 2020-01-22 NOTE — Progress Notes (Signed)
LATE ENTRY-Received call from lab for Creatinine of 6.02.  Called Marlon Pel, RN and relayed result and she has already addressed it with our symptom management clinic.  Gardiner Rhyme, RN

## 2020-01-22 NOTE — ED Notes (Signed)
Coming from cancer center-abnormal kidney function tests, creatine 6.0-history of breast cancer

## 2020-01-22 NOTE — ED Provider Notes (Signed)
[facility] DEPT Provider Note   CSN: [id number] Arrival date & time: 01/22/20  1107     History Chief Complaint  Patient presents with  . AKI    [name] is a 64 y.o. female.  64 year old patient presents with acute kidney injury of unknown etiology.  Patient recently diagnosed with breast cancer and was at cancer center today to receive her very first treatment and was found to be hypotensive with a pressure in the 70s.  Patient denies any dizziness or recent history of volume loss.  Was given IV fluids and had blood work performed which showed a creatinine of 6 which is new for her baseline.  Patient does have a history of hypertension has been compliant with her hypertensives which include an ACE inhibitor as well as a diuretic.  Denies any fever or chills.  She has not been dyspneic.  Blood pressure improved with fluids and sent here for further management        Past Medical History:  Diagnosis Date  . Arthritis   . Cancer ([place]) 11/2019   Left breast  . Diverticulitis   . Family history of ovarian cancer   . Heart murmur    11/26/19 echo: Mild AS. AV mean gradient 12.0 mmHg; however, LVOT gradient 8 mmHg with intracvitary gradient-significant AS is not suspected  . Hypertension     Patient Active Problem List   Diagnosis Date Noted  . S/P left mastectomy 12/19/2019  . Genetic testing 11/30/2019  . Morbid obesity with BMI of 60.0-69.9, adult ([place]) 11/21/2019  . Family history of ovarian cancer   . Malignant neoplasm of upper-outer quadrant of left breast in female, estrogen receptor positive ([place]) 11/14/2019  . Fracture of radial head, right, closed 06/25/2019  . Frequent falls 01/10/2019  . Bilateral bunions 01/10/2019  . Toenail fungus 01/10/2019  . Alcoholic hepatitis 30/16/0109  . Edema 11/20/2018  . Alcohol-induced mood disorder ([place]) 11/01/2013  . Alcohol dependence ([place]) 11/01/2013  . Essential hypertension 06/20/2007  .  LOW BACK PAIN 06/20/2007  . DIVERTICULOSIS, COLON 05/05/2007    Past Surgical History:  Procedure Laterality Date  . MASTECTOMY WITH AXILLARY LYMPH NODE DISSECTION Left 12/19/2019   Procedure: LEFT MASTECTOMY WITH AXILLARY LYMPH NODE DISSECTION;  Surgeon: [name], MD;  Location: [facility];  Service: General;  Laterality: Left;  Marland Kitchen MULTIPLE EXTRACTIONS WITH ALVEOLOPLASTY Bilateral 12/14/2019   Procedure: MULTIPLE EXTRACTION WITH ALVEOLOPLASTY;  Surgeon: [name], DDS;  Location: [facility];  Service: Oral Surgery;  Laterality: Bilateral;  . PORTACATH PLACEMENT N/A 12/19/2019   Procedure: INSERTION PORT-A-CATH WITH ULTRASOUND GUIDANCE;  Surgeon: [name], MD;  Location: [facility];  Service: General;  Laterality: N/A;  . RADIAL HEAD ARTHROPLASTY Right 06/25/2019   Procedure: RIGHT RADIAL HEAD ARTHROPLASTY;  Surgeon: [name], MD;  Location: [facility];  Service: Orthopedics;  Laterality: Right;  . TUBAL LIGATION       OB History   No obstetric history on file.     Family History  Problem Relation Age of Onset  . Hypertension Mother   . Dementia Mother   . Ovarian cancer Half-Sister 106  . Diabetes Half-Sister   . Stroke Half-Sister     Social History   Tobacco Use  . Smoking status: Never Smoker  . Smokeless tobacco: Never Used  Substance Use Topics  . Alcohol use: Yes    Alcohol/week: 3.0 standard drinks    Types: 1 Cans of beer, 2 Shots of liquor per week  Comment: daily  . Drug use: Yes    Types: Marijuana    Home Medications Prior to Admission medications   Medication Sig Start Date End Date Taking? Authorizing Provider  amoxicillin (AMOXIL) 500 MG capsule Take 1 capsule (500 mg total) by mouth 3 (three) times daily. Patient not taking: Reported on 01/02/2020 12/14/19   [name], DDS  amoxicillin-clavulanate (AUGMENTIN) 500-125 MG tablet Take 1 tablet (500 mg total) by mouth 2 (two) times daily. Take after eating 01/02/20   Fulp, Cammie, MD  benazepril  (LOTENSIN) 20 MG tablet TAKE 1 TABLET (20 MG TOTAL) BY MOUTH DAILY. TO LOWER BLOOD PRESSURE Patient taking differently: Take 20 mg by mouth daily.  11/05/19   Fulp, Ander Gaster, MD  Blood Pressure Monitoring ([employer] ADVANCED BP MONITOR) DEVI Measure blood pressure daily and record in diary 12/18/18   Elsie Stain, MD  dexamethasone (DECADRON) 4 MG tablet Take1 tablet by mouth twice a day on the day after chemotherapy and the day after that, then the morning of day 4, then stop. Take with food. 01/17/20   Magrinat, Virgie Dad, MD  hydrochlorothiazide (HYDRODIURIL) 25 MG tablet Take 1 tablet (25 mg total) by mouth daily. 10/17/19   Fulp, Cammie, MD  HYDROcodone-Acetaminophen 5-300 MG TABS One every 6 hours as needed for pain; eat something before taking medication 01/02/20   Fulp, Cammie, MD  ibuprofen (ADVIL) 600 MG tablet Take 1 tablet (600 mg total) by mouth every 8 (eight) hours as needed for moderate pain. Take after eating 01/02/20   Fulp, Cammie, MD  KLOR-CON M20 20 MEQ tablet TAKE 1 TABLET BY MOUTH EVERY DAY Patient taking differently: Take 20 mEq by mouth daily.  09/07/19   Fulp, Cammie, MD  lidocaine-prilocaine (EMLA) cream Apply to affected area once 01/17/20   Magrinat, Virgie Dad, MD  meloxicam (MOBIC) 15 MG tablet Take 1 tablet (15 mg total) by mouth daily. 08/01/19   Edrick Kins, DPM  metoprolol tartrate (LOPRESSOR) 50 MG tablet Take 1 tablet (50 mg total) by mouth 2 (two) times daily. 10/17/19   Fulp, Cammie, MD  Multiple Vitamins-Minerals (CENTRUM SILVER ADULT 50+) TABS Take 1 tablet by mouth daily.     [provider]  Multiple Vitamins-Minerals (HAIR SKIN AND NAILS FORMULA PO) Take 1 tablet by mouth daily at 2 PM.    [provider]  ondansetron (ZOFRAN) 4 MG tablet Take 1-2 tablets (4-8 mg total) by mouth every 8 (eight) hours as needed for nausea or vomiting. Patient not taking: Reported on 09/05/2019 06/25/19   [name], MD  oxyCODONE-acetaminophen (PERCOCET) 5-325 MG tablet  Take 1 tablet by mouth every 4 (four) hours as needed. Patient not taking: Reported on 01/02/2020 12/14/19   [name], DDS  prochlorperazine (COMPAZINE) 10 MG tablet Take one tablet 3 times a day before meals starting the evening of chemo and continuing for 3 days 01/17/20   Magrinat, Virgie Dad, MD    Allergies    Amlodipine  Review of Systems   Review of Systems  All other systems reviewed and are negative.   Physical Exam Updated Vital Signs BP 112/71   Pulse 73   Resp (!) 22   SpO2 100%   Physical Exam Vitals and nursing note reviewed.  Constitutional:      General: She is not in acute distress.    Appearance: Normal appearance. She is well-developed. She is not toxic-appearing.  HENT:     Head: Normocephalic and atraumatic.  Eyes:  General: Lids are normal.     Conjunctiva/sclera: Conjunctivae normal.     Pupils: Pupils are equal, round, and reactive to light.  Neck:     Thyroid: No thyroid mass.     Trachea: No tracheal deviation.  Cardiovascular:     Rate and Rhythm: Normal rate and regular rhythm.     Heart sounds: Normal heart sounds. No murmur. No gallop.   Pulmonary:     Effort: Pulmonary effort is normal. No respiratory distress.     Breath sounds: Normal breath sounds. No stridor. No decreased breath sounds, wheezing, rhonchi or rales.  Abdominal:     General: Bowel sounds are normal. There is no distension.     Palpations: Abdomen is soft.     Tenderness: There is no abdominal tenderness. There is no rebound.  Musculoskeletal:        General: No tenderness. Normal range of motion.     Cervical back: Normal range of motion and neck supple.  Skin:    General: Skin is warm and dry.     Findings: No abrasion or rash.  Neurological:     Mental Status: She is alert and oriented to person, place, and time.     GCS: GCS eye subscore is 4. GCS verbal subscore is 5. GCS motor subscore is 6.     Cranial Nerves: No cranial nerve deficit.     Sensory: No  sensory deficit.  Psychiatric:        Speech: Speech normal.        Behavior: Behavior normal.     ED Results / Procedures / Treatments   Labs (all labs ordered are listed, but only abnormal results are displayed) Labs Reviewed  SARS CORONAVIRUS 2 BY RT PCR (HOSPITAL ORDER, [facility])  CULTURE, BLOOD (ROUTINE X 2)  CULTURE, BLOOD (ROUTINE X 2)  URINALYSIS, ROUTINE W REFLEX MICROSCOPIC  LACTIC ACID, PLASMA    EKG EKG Interpretation  Date/Time:  Tuesday Jan 22 2020 11:14:32 EDT Ventricular Rate:  72 PR Interval:    QRS Duration: 113 QT Interval:  407 QTC Calculation: 446 R Axis:   28 Text Interpretation: Sinus rhythm Prolonged PR interval Incomplete left bundle branch block Low voltage, precordial leads Confirmed by [name] Leigh ([id number]) on 01/22/2020 12:19:56 PM   Radiology No results found.  Procedures Procedures (including critical care time)  Medications Ordered in ED Medications  0.9 %  sodium chloride infusion (has no administration in time range)    ED Course  I have reviewed the triage vital signs and the nursing notes.  Pertinent labs & imaging results that were available during my care of the patient were reviewed by me and considered in my medical decision making (see chart for details).    MDM Rules/Calculators/A&P                      Patient's labs reviewed here from cancer center.  Blood pressure stable here.  Will consult nephrology admit to the hospitalist service Final Clinical Impression(s) / ED Diagnoses Final diagnoses:  None   Patient states he feels at her baseline at this time.  Temperature noted and blood sugar appropriate.  Lactate normal.  Blood pressure has been stable here.  Discussed with Dr. Justin Mend from nephrology will follow along to the patient's acute kidney injury.  Currently receiving IV hydration and will admit to the hospitalist service Rx / DC Orders ED Discharge Orders    None  [name] Leigh, MD 01/22/20 1309

## 2020-01-22 NOTE — Consult Note (Addendum)
[facility] Renal Consultation Note  Requesting MD:  Indication for Consultation: Acute kidney injury, maintenance of euvolemia, assessment of anemia, treatment of metabolic acidosis  HPI:   [name] is a 64 y.o. female.  That is a history of recently diagnosed left breast cancer status postmastectomy April 2021.  She does have a slight elevation of his serum creatinine at baseline and reviewing records in epic appears that she has a creatinine that runs anywhere from about 1.26 mg to deciliter to 2.4 mg/dL.  She was due to start chemotherapy morning of Jan 22, 2020 when routine labs showed a serum creatinine of 6 mg/dL and a blood pressure of 80 mmHg.  Reviewing her record it appears that she takes the ACE inhibitor benazepril, the diuretic Lasix and hydrochlorothiazide, and also a combination of nonsteroidal anti-inflammatory drugs and Cox 2 inhibitors.  Blood pressure 92/60 pulse 73 temperature 95.4 O2 sats 100% room air  Sodium 119 potassium 4.8 chloride 89 CO2 16 glucose 119 BUN 74 creatinine 6 calcium 8.4 albumin 2.7 liver enzymes within normal range hemoglobin 8.4  Home medications prior to admission benazepril 20 mg daily Decadron 4 mg twice daily hydrochlorothiazide 25 mg daily hydrocodone/acetaminophen 5/300 mg daily Advil 600 mg every 8 hours, meloxicam 15 mg daily potassium chloride 20 mEq daily multivitamins 1 daily Augmentin 500/125 mg tablets twice daily, Zofran every 8 hours as needed, Percocet 1 tablet every 4 hours   Creatinine  Date/Time Value Ref Range Status  11/21/2019 09:08 AM 1.59 (H) 0.44 - 1.00 mg/dL Final   Creatinine, Ser  Date/Time Value Ref Range Status  01/22/2020 08:40 AM 6.02 (HH) 0.44 - 1.00 mg/dL Final    Comment:    CRITICAL RESULT CALLED TO, READ BACK BY AND VERIFIED WITH: Laurence Compton, RN AT [phone] BY P.SUTCAVAGE   01/02/2020 05:10 PM 1.48 (H) 0.57 - 1.00 mg/dL Final  12/25/2019 09:19 AM 1.26 (H) 0.44 - 1.00 mg/dL Final  12/20/2019  06:52 AM 1.93 (H) 0.44 - 1.00 mg/dL Final  12/19/2019 10:47 AM 2.40 (H) 0.44 - 1.00 mg/dL Final  12/14/2019 09:05 AM 1.90 (H) 0.44 - 1.00 mg/dL Final  12/13/2019 10:30 AM 2.15 (H) 0.44 - 1.00 mg/dL Final  10/17/2019 03:41 PM 1.21 (H) 0.57 - 1.00 mg/dL Final  06/15/2019 04:22 PM 1.30 (H) 0.57 - 1.00 mg/dL Final  01/10/2019 09:57 AM 0.83 0.57 - 1.00 mg/dL Final  11/20/2018 11:07 AM 0.66 0.57 - 1.00 mg/dL Final  11/03/2018 04:27 PM 1.00 0.44 - 1.00 mg/dL Final  10/12/2016 03:49 PM 0.70 0.44 - 1.00 mg/dL Final  07/09/2014 04:02 PM 1.00 0.50 - 1.10 mg/dL Final  11/01/2013 06:40 AM 0.55 0.50 - 1.10 mg/dL Final  10/31/2013 03:06 AM 0.52 0.50 - 1.10 mg/dL Final  10/30/2013 06:00 PM 0.52 0.50 - 1.10 mg/dL Final  07/14/2012 05:10 PM 0.72 0.50 - 1.10 mg/dL Final  06/26/2010 11:56 AM 0.7 0.4 - 1.2 mg/dL Final  11/05/2008 08:27 PM 0.85 0.40 - 1.20 mg/dL Final  05/17/2008 08:55 PM 0.63 0.40 - 1.20 mg/dL Final  11/11/2007 05:50 AM 0.68  Final  11/10/2007 06:21 AM 0.67  Final  11/09/2007 03:47 PM 0.85  Final  11/08/2007 01:40 PM 1.17  Final     PMHx:   Past Medical History:  Diagnosis Date  . Arthritis   . Cancer ([place]) 11/2019   Left breast  . Diverticulitis   . Family history of ovarian cancer   . Heart murmur    11/26/19 echo: Mild AS. AV mean gradient  12.0 mmHg; however, LVOT gradient 8 mmHg with intracvitary gradient-significant AS is not suspected  . Hypertension     Past Surgical History:  Procedure Laterality Date  . MASTECTOMY WITH AXILLARY LYMPH NODE DISSECTION Left 12/19/2019   Procedure: LEFT MASTECTOMY WITH AXILLARY LYMPH NODE DISSECTION;  Surgeon: [name], MD;  Location: [facility];  Service: General;  Laterality: Left;  Marland Kitchen MULTIPLE EXTRACTIONS WITH ALVEOLOPLASTY Bilateral 12/14/2019   Procedure: MULTIPLE EXTRACTION WITH ALVEOLOPLASTY;  Surgeon: [name], DDS;  Location: [facility];  Service: Oral Surgery;  Laterality: Bilateral;  . PORTACATH PLACEMENT N/A 12/19/2019    Procedure: INSERTION PORT-A-CATH WITH ULTRASOUND GUIDANCE;  Surgeon: [name], MD;  Location: [facility];  Service: General;  Laterality: N/A;  . RADIAL HEAD ARTHROPLASTY Right 06/25/2019   Procedure: RIGHT RADIAL HEAD ARTHROPLASTY;  Surgeon: [name], MD;  Location: [facility];  Service: Orthopedics;  Laterality: Right;  . TUBAL LIGATION      Family Hx:  Family History  Problem Relation Age of Onset  . Hypertension Mother   . Dementia Mother   . Ovarian cancer Half-Sister 61  . Diabetes Half-Sister   . Stroke Half-Sister     Social History:  reports that she has never smoked. She has never used smokeless tobacco. She reports current alcohol use of about 3.0 standard drinks of alcohol per week. She reports current drug use. Drug: Marijuana.  Allergies:  Allergies  Allergen Reactions  . Amlodipine Swelling    BLE edema    Medications: Prior to Admission medications   Medication Sig Start Date End Date Taking? Authorizing Provider  benazepril (LOTENSIN) 20 MG tablet TAKE 1 TABLET (20 MG TOTAL) BY MOUTH DAILY. TO LOWER BLOOD PRESSURE Patient taking differently: Take 20 mg by mouth daily.  11/05/19  Yes Fulp, Cammie, MD  dexamethasone (DECADRON) 4 MG tablet Take1 tablet by mouth twice a day on the day after chemotherapy and the day after that, then the morning of day 4, then stop. Take with food. 01/17/20  Yes Magrinat, Virgie Dad, MD  hydrochlorothiazide (HYDRODIURIL) 25 MG tablet Take 1 tablet (25 mg total) by mouth daily. 10/17/19  Yes Fulp, Cammie, MD  HYDROcodone-Acetaminophen 5-300 MG TABS One every 6 hours as needed for pain; eat something before taking medication Patient taking differently: Take 1 tablet by mouth every 6 (six) hours as needed (pain).  01/02/20  Yes Fulp, Cammie, MD  ibuprofen (ADVIL) 600 MG tablet Take 1 tablet (600 mg total) by mouth every 8 (eight) hours as needed for moderate pain. Take after eating 01/02/20  Yes Fulp, Cammie, MD  KLOR-CON M20 20 MEQ tablet TAKE 1  TABLET BY MOUTH EVERY DAY Patient taking differently: Take 20 mEq by mouth daily.  09/07/19  Yes Fulp, Cammie, MD  lidocaine-prilocaine (EMLA) cream Apply to affected area once 01/17/20  Yes Magrinat, Virgie Dad, MD  meloxicam (MOBIC) 15 MG tablet Take 1 tablet (15 mg total) by mouth daily. 08/01/19  Yes Edrick Kins, DPM  metoprolol tartrate (LOPRESSOR) 50 MG tablet Take 1 tablet (50 mg total) by mouth 2 (two) times daily. 10/17/19  Yes Fulp, Cammie, MD  Multiple Vitamins-Minerals (CENTRUM SILVER ADULT 50+) TABS Take 1 tablet by mouth daily.    Yes [provider]  Multiple Vitamins-Minerals (HAIR SKIN AND NAILS FORMULA PO) Take 1 tablet by mouth daily at 2 PM.   Yes [provider]  prochlorperazine (COMPAZINE) 10 MG tablet Take one tablet 3 times a day before meals starting the evening of  chemo and continuing for 3 days 01/17/20  Yes Magrinat, Virgie Dad, MD  amoxicillin (AMOXIL) 500 MG capsule Take 1 capsule (500 mg total) by mouth 3 (three) times daily. Patient not taking: Reported on 01/02/2020 12/14/19   [name], DDS  amoxicillin-clavulanate (AUGMENTIN) 500-125 MG tablet Take 1 tablet (500 mg total) by mouth 2 (two) times daily. Take after eating Patient not taking: Reported on 01/22/2020 01/02/20   [name], MD  Blood Pressure Monitoring ([employer] ADVANCED BP MONITOR) DEVI Measure blood pressure daily and record in diary 12/18/18   Elsie Stain, MD  ondansetron (ZOFRAN) 4 MG tablet Take 1-2 tablets (4-8 mg total) by mouth every 8 (eight) hours as needed for nausea or vomiting. Patient not taking: Reported on 09/05/2019 06/25/19   [name], MD  oxyCODONE-acetaminophen (PERCOCET) 5-325 MG tablet Take 1 tablet by mouth every 4 (four) hours as needed. Patient not taking: Reported on 01/02/2020 12/14/19   [name], DDS     Labs:  Results for orders placed or performed during the hospital encounter of 01/22/20 (from the past 48 hour(s))  Lactic acid, plasma     Status: None    Collection Time: 01/22/20 12:09 PM  Result Value Ref Range   Lactic Acid, Venous 1.5 0.5 - 1.9 mmol/L    Comment: Performed at [facility], [place] [address], [place], [place] [address]  SARS Coronavirus 2 by RT PCR (hospital order, performed in [facility] hospital lab) Nasopharyngeal Nasopharyngeal Swab     Status: None   Collection Time: 01/22/20 12:10 PM   Specimen: Nasopharyngeal Swab  Result Value Ref Range   SARS Coronavirus 2 NEGATIVE NEGATIVE    Comment: (NOTE) SARS-CoV-2 target nucleic acids are NOT DETECTED. The SARS-CoV-2 RNA is generally detectable in upper and lower respiratory specimens during the acute phase of infection. The lowest concentration of SARS-CoV-2 viral copies this assay can detect is 250 copies / mL. A negative result does not preclude SARS-CoV-2 infection and should not be used as the sole basis for treatment or other patient management decisions.  A negative result may occur with improper specimen collection / handling, submission of specimen other than nasopharyngeal swab, presence of viral mutation(s) within the areas targeted by this assay, and inadequate number of viral copies (<250 copies / mL). A negative result must be combined with clinical observations, patient history, and epidemiological information. Fact Sheet for Patients:   StrictlyIdeas.no Fact Sheet for Healthcare Providers: BankingDealers.co.za This test is not yet approved or cleared  by the [place] FDA and has been authorized for detection and/or diagnosis of SARS-CoV-2 by FDA under an Emergency Use Authorization (EUA).  This EUA will remain in effect (meaning this test can be used) for the duration of the COVID-19 declaration under Section 564(b)(1) of the Act, 21 U.S.C. section 360bbb-3(b)(1), unless the authorization is terminated or revoked sooner. Performed at [facility], [place] [address] [place], [place] [address]      ROS: As per HPI otherwise unremarkable.  Pertinent negatives.  No orthostasis asymptomatic hypotension.  Denies GI blood loss, nausea vomiting or diarrhea.    Physical Exam: Vitals:   01/22/20 1300 01/22/20 1330  BP: 110/60 92/60  Pulse: 73 73  Resp: 17 15  Temp:    SpO2: 100% 100%     General: Calm nondistressed HEENT: None traumatic external appearance normal oropharynx clear Eyes: Pupils were round equal reactive Neck: Supple no masses or thyromegaly Heart: Regular rate and rhythm  no murmurs rubs or gallops chronic bilateral leg edema with 2+ pedal pulses. Lungs: Clear to auscultation bilaterally no wheezes rales or crackles Abdomen: Soft nontender bowel sounds are present Extremities: Chronic lower extremity lymphedema Skin: No skin rashes no itching Neuro: Cranial nerves intact sensation strength regular 5/5  Assessment/Plan: 1.Renal-acute kidney injury this appears to be in the setting of some chronic kidney damage.  Her renal function with baseline does not appear completely normal and she may indeed have stage III chronic kidney disease.  Her baseline creatinine seems to vary considerably.  This obviously is a change with a creatinine that is increased to above 6 mg/dL.  Noted that she is hypotensive and does take ACE inhibitors, nonsteroidal anti-inflammatory drugs, Cox 2 inhibitors, diuretics in one form or other.  I will check a urinalysis previous urinalyses have been unremarkable although she did have 30 mg/dL of protein November 03, 2018  a hazy appearance to her urine and no cellular activity.  I do not see a recent renal ultrasound to be nice to get one in order to rule out hydronephrosis.  Recent 2D echo did not reveal any pericardial effusions or tamponade physiology.  It may be worthwhile checking another 2D echo for comparison.  Would avoid nephrotoxins. 2. Hypertension/volume  -appears to have some volume depletion.  Stop  antihypertensive medications and cautiously hydrate. 3.  Metabolic acidosis.  Believe we need to correct with IV sodium bicarbonate at 100 cc/h.    4.  Breast cancer-chemotherapy on hold until stabilization of renal function. 5.  Hyponatremia.  I suspect this is appropriate in the setting of hypotension.  Correction of hypotension and restoration of volume with isotonic saline should help correct this.  We will follow.   Sherril Croon 01/22/2020, 1:46 PM

## 2020-01-22 NOTE — Progress Notes (Signed)
ns

## 2020-01-22 NOTE — Telephone Encounter (Signed)
Post bolus of NS- BP is 97/58- CMET resulted with serum creatinine of 6.02 and sodium of 119 as well as drop in heme to 8.4.  Per MD - hold treatment today- request for [facility]/ Sandi Mealy to see pt to go to there ER for further evaluation and care.  This RN spoke with [facility] - and informed treatment room nurse and charge nurse.

## 2020-01-22 NOTE — Progress Notes (Signed)
MD notified pt is hypotensive.  NS bolus given.  BP 95/68 after bolus.  SCR also noted to be elevated 6.02.  MD notified.  No treatment today.  Sandi Mealy, PA at bedside.  Pt to be evaluated & admitted.    Pt transferred to ED by Sandi Mealy, PA via wheelchair.  PAC flushed and intact.

## 2020-01-22 NOTE — Progress Notes (Signed)
  Echocardiogram 2D Echocardiogram has been performed.  [name] 01/22/2020, 2:58 PM

## 2020-01-23 ENCOUNTER — Encounter: Payer: Self-pay | Admitting: *Deleted

## 2020-01-23 DIAGNOSIS — N170 Acute kidney failure with tubular necrosis: Principal | ICD-10-CM

## 2020-01-23 DIAGNOSIS — E871 Hypo-osmolality and hyponatremia: Secondary | ICD-10-CM

## 2020-01-23 DIAGNOSIS — R7881 Bacteremia: Secondary | ICD-10-CM

## 2020-01-23 LAB — COMPREHENSIVE METABOLIC PANEL
ALT: 33 U/L (ref 0–44)
AST: 43 U/L — ABNORMAL HIGH (ref 15–41)
Albumin: 2.7 g/dL — ABNORMAL LOW (ref 3.5–5.0)
Alkaline Phosphatase: 49 U/L (ref 38–126)
Anion gap: 12 (ref 5–15)
BUN: 80 mg/dL — ABNORMAL HIGH (ref 8–23)
CO2: 21 mmol/L — ABNORMAL LOW (ref 22–32)
Calcium: 8 mg/dL — ABNORMAL LOW (ref 8.9–10.3)
Chloride: 87 mmol/L — ABNORMAL LOW (ref 98–111)
Creatinine, Ser: 4.36 mg/dL — ABNORMAL HIGH (ref 0.44–1.00)
GFR calc Af Amer: 12 mL/min — ABNORMAL LOW (ref >60)
GFR calc non Af Amer: 10 mL/min — ABNORMAL LOW (ref >60)
Glucose, Bld: 101 mg/dL — ABNORMAL HIGH (ref 70–99)
Potassium: 5 mmol/L (ref 3.5–5.1)
Sodium: 120 mmol/L — ABNORMAL LOW (ref 135–145)
Total Bilirubin: 0.3 mg/dL (ref 0.3–1.2)
Total Protein: 6.4 g/dL — ABNORMAL LOW (ref 6.5–8.1)

## 2020-01-23 LAB — CBC
HCT: 22.3 % — ABNORMAL LOW (ref 36.0–46.0)
Hemoglobin: 7.8 g/dL — ABNORMAL LOW (ref 12.0–15.0)
MCH: 33.1 pg (ref 26.0–34.0)
MCHC: 35 g/dL (ref 30.0–36.0)
MCV: 94.5 fL (ref 80.0–100.0)
Platelets: 208 10*3/uL (ref 150–400)
RBC: 2.36 MIL/uL — ABNORMAL LOW (ref 3.87–5.11)
RDW: 12.3 % (ref 11.5–15.5)
WBC: 6.5 10*3/uL (ref 4.0–10.5)
nRBC: 0 % (ref 0.0–0.2)

## 2020-01-23 LAB — BLOOD CULTURE ID PANEL (REFLEXED)

## 2020-01-23 LAB — PHOSPHORUS: Phosphorus: 4.6 mg/dL (ref 2.5–4.6)

## 2020-01-23 MED ORDER — CHLORHEXIDINE GLUCONATE CLOTH 2 % EX PADS
6.0000 | MEDICATED_PAD | Freq: Every day | CUTANEOUS | Status: DC
  Administered 2020-01-23 – 2020-01-28 (×6): 6 via TOPICAL

## 2020-01-23 MED ORDER — SODIUM CHLORIDE 0.9 % IV SOLN
2.0000 g | Freq: Three times a day (TID) | INTRAVENOUS | Status: DC
  Administered 2020-01-23 – 2020-01-25 (×6): 2 g via INTRAVENOUS
  Filled 2020-01-23: qty 2
  Filled 2020-01-23 (×3): qty 2000
  Filled 2020-01-23 (×3): qty 2

## 2020-01-23 NOTE — Progress Notes (Signed)
[facility] ROUNDING NOTE   Subjective:   64 year old lady history of left breast cancer status post mastectomy April 2021.  Baseline serum creatinine appears fairly variable but has ranged anywhere from 1.26 mg/dL to about 2.4 mg/dL.  She is due to start chemotherapy in the morning of Jan 22, 2020 routine labs showed a serum creatinine of 6 mg/dL the blood pressure 80 mmHg.  She was taking ACE inhibitor [place] nasal pill, the diuretic Lasix and hydrochlorothiazide and also combination nonsteroidal inflammatories and Cox 2 inhibitors.  Blood pressure 103/49 pulse 77 temperature 97.6 O2 sats 90% room air  Sodium 120 potassium 5.0 chloride 87 CO2 21 creatinine 4.36 BUN 80 glucose 101 albumin 2.7 iron saturation 48% hemoglobin 7.8  Objective:  Vital signs in last 24 hours:  Temp:  [95.4 F (35.2 C)-97.6 F (36.4 C)] 97.6 F (36.4 C) (05/26 0523) Pulse Rate:  [70-77] 77 (05/26 0523) Resp:  [13-24] 17 (05/26 0523) BP: (41-112)/(32-86) 103/49 (05/26 0523) SpO2:  [99 %-100 %] 100 % (05/26 0523) Weight:  [440 kg] 137 kg (05/25 2327)  Weight change:  Filed Weights   01/22/20 2327  Weight: (!) 137 kg    Intake/Output: No intake/output data recorded.   Intake/Output this shift:  No intake/output data recorded.  General: Calm nondistressed HEENT: None traumatic external appearance normal oropharynx clear Eyes: Pupils were round equal reactive Neck: Supple no masses or thyromegaly Heart: Regular rate and rhythm no murmurs rubs or gallops chronic bilateral leg edema with 2+ pedal pulses. Lungs: Clear to auscultation bilaterally no wheezes rales or crackles Abdomen: Soft nontender bowel sounds are present Extremities: Chronic lower extremity lymphedema Skin: No skin rashes no itching Neuro: Cranial nerves intact sensation strength regular 5/5   Basic Metabolic Panel: Recent Labs  Lab 01/22/20 0840 01/22/20 1400 01/23/20 0531  NA 119*  --  120*  K 4.8  --  5.0  CL  89*  --  87*  CO2 16*  --  21*  GLUCOSE 119*  --  101*  BUN 74*  --  80*  CREATININE 6.02* 5.76* 4.36*  CALCIUM 8.4*  --  8.0*  MG  --  1.3*  --   PHOS  --   --  4.6    Liver Function Tests: Recent Labs  Lab 01/22/20 0840 01/23/20 0531  AST 35 43*  ALT 34 33  ALKPHOS 62 49  BILITOT 0.2* 0.3  PROT 7.2 6.4*  ALBUMIN 2.7* 2.7*   No results for input(s): LIPASE, AMYLASE in the last 168 hours. No results for input(s): AMMONIA in the last 168 hours.  CBC: Recent Labs  Lab 01/22/20 0840 01/22/20 1400 01/23/20 0531  WBC 6.3 5.9 6.5  NEUTROABS 2.8  --   --   HGB 8.4* 8.3* 7.8*  HCT 23.8* 25.1* 22.3*  MCV 92.2 96.2 94.5  PLT 210 203 208    Cardiac Enzymes: No results for input(s): CKTOTAL, CKMB, CKMBINDEX, TROPONINI in the last 168 hours.  BNP: Invalid input(s): POCBNP  CBG: No results for input(s): GLUCAP in the last 168 hours.  Microbiology: Results for orders placed or performed during the hospital encounter of 01/22/20  Culture, blood (Routine X 2) w Reflex to ID Panel     Status: None (Preliminary result)   Collection Time: 01/22/20 12:09 PM   Specimen: BLOOD  Result Value Ref Range Status   Specimen Description   Final    BLOOD PORTA CATH URINE, CATHETERIZED Performed at [facility]  [address], [place], [place] [address]    Special Requests   Final    BOTTLES DRAWN AEROBIC AND ANAEROBIC Blood Culture adequate volume Performed at [facility] [address] [place], [place] [address]    Culture  Setup Time   Final    GRAM POSITIVE COCCI IN PAIRS IN CHAINS IN BOTH AEROBIC AND ANAEROBIC BOTTLES Organism ID to follow Performed at [facility], [place] [address] [place], [place] [address]    Culture GRAM POSITIVE COCCI  Final   Report Status PENDING  Incomplete  SARS Coronavirus 2 by RT PCR (hospital order, performed in [facility] hospital lab) Nasopharyngeal Nasopharyngeal Swab     Status: None    Collection Time: 01/22/20 12:10 PM   Specimen: Nasopharyngeal Swab  Result Value Ref Range Status   SARS Coronavirus 2 NEGATIVE NEGATIVE Final    Comment: (NOTE) SARS-CoV-2 target nucleic acids are NOT DETECTED. The SARS-CoV-2 RNA is generally detectable in upper and lower respiratory specimens during the acute phase of infection. The lowest concentration of SARS-CoV-2 viral copies this assay can detect is 250 copies / mL. A negative result does not preclude SARS-CoV-2 infection and should not be used as the sole basis for treatment or other patient management decisions.  A negative result may occur with improper specimen collection / handling, submission of specimen other than nasopharyngeal swab, presence of viral mutation(s) within the areas targeted by this assay, and inadequate number of viral copies (<250 copies / mL). A negative result must be combined with clinical observations, patient history, and epidemiological information. Fact Sheet for Patients:   StrictlyIdeas.no Fact Sheet for Healthcare Providers: BankingDealers.co.za This test is not yet approved or cleared  by the [place] FDA and has been authorized for detection and/or diagnosis of SARS-CoV-2 by FDA under an Emergency Use Authorization (EUA).  This EUA will remain in effect (meaning this test can be used) for the duration of the COVID-19 declaration under Section 564(b)(1) of the Act, 21 U.S.C. section 360bbb-3(b)(1), unless the authorization is terminated or revoked sooner. Performed at [facility], [place] [address] [place], [place] [address]     Coagulation Studies: No results for input(s): LABPROT, INR in the last 72 hours.  Urinalysis: Recent Labs    01/22/20 1601  COLORURINE YELLOW  LABSPEC 1.009  PHURINE 5.0  GLUCOSEU NEGATIVE  HGBUR NEGATIVE  BILIRUBINUR NEGATIVE  KETONESUR NEGATIVE  PROTEINUR NEGATIVE  NITRITE NEGATIVE   LEUKOCYTESUR SMALL*      Imaging: US RENAL  Result Date: 01/22/2020 CLINICAL DATA:  Acute renal failure. EXAM: RENAL / URINARY TRACT ULTRASOUND COMPLETE COMPARISON:  None. FINDINGS: Right Kidney: Renal measurements: 12.6 x 5.4 x 6.4 cm = volume: 224 mL . Echogenicity within normal limits. No mass or hydronephrosis visualized. Left Kidney: Renal measurements: 11.2 x 6.0 x 5.7 cm = volume: 202 mL. Echogenicity within normal limits. No mass or hydronephrosis visualized. Bladder: Appears normal for degree of bladder distention. Other: None. IMPRESSION: Normal renal ultrasound. Electronically Signed   By: Lajean Manes M.D.   On: 01/22/2020 14:46   ECHOCARDIOGRAM LIMITED  Result Date: 01/22/2020    ECHOCARDIOGRAM LIMITED REPORT   Patient Name:   [name] Date of Exam: 01/22/2020 Medical Rec #:  [id number]     Height:       61.0 in Accession #:    [id number]    Weight:       303.9 lb Date of Birth:  Feb 09, 1956    BSA:  2.256 m Patient Age:    79 years      BP:           92/60 mmHg Patient Gender: F             HR:           73 bpm. Exam Location:  Inpatient Procedure: Limited Echo Indications:    Pericardial effusion  History:        Patient has prior history of Echocardiogram examinations, most                 recent 11/26/2019. Risk Factors:Hypertension. Breast cancer, s/p                 mastectomy.  Sonographer:    Dustin Flock Referring Phys: [place]  1. Left ventricular ejection fraction, by estimation, is 65 to 70%. The left ventricle has normal function. The left ventricle has no regional wall motion abnormalities. There is mild left ventricular hypertrophy.  2. Right ventricular systolic function is normal. The right ventricular size is normal.  3. The inferior vena cava is normal in size with greater than 50% respiratory variability, suggesting right atrial pressure of 3 mmHg.  4. No pericardial effusion.  5. Limited echo with no doppler. FINDINGS  Left  Ventricle: Left ventricular ejection fraction, by estimation, is 65 to 70%. The left ventricle has normal function. The left ventricle has no regional wall motion abnormalities. The left ventricular internal cavity size was normal in size. There is  mild left ventricular hypertrophy. Right Ventricle: The right ventricular size is normal. No increase in right ventricular wall thickness. Right ventricular systolic function is normal. Left Atrium: Left atrial size was normal in size. Right Atrium: Right atrial size was normal in size. Pericardium: There is no evidence of pericardial effusion. Venous: The inferior vena cava is normal in size with greater than 50% respiratory variability, suggesting right atrial pressure of 3 mmHg. IAS/Shunts: No atrial level shunt detected by color flow Doppler.  LEFT VENTRICLE PLAX 2D LVIDd:         3.30 cm LVIDs:         1.80 cm LV PW:         1.50 cm LV IVS:        1.30 cm  Loralie Champagne MD Electronically signed by Loralie Champagne MD Signature Date/Time: 01/22/2020/3:19:05 PM    Final      Medications:   .  sodium bicarbonate (isotonic) infusion in sterile water 100 mL/hr at 01/23/20 0344   . heparin  5,000 Units Subcutaneous Q8H  . metoprolol tartrate  50 mg Oral BID   acetaminophen **OR** acetaminophen, HYDROcodone-acetaminophen, ondansetron **OR** ondansetron (ZOFRAN) IV  Assessment/ Plan:  1.Renal-acute kidney injury this appears to be in the setting of some chronic kidney damage.  Her renal function with baseline does not appear completely normal and she may indeed have stage III chronic kidney disease.  Her baseline creatinine seems to vary considerably.  This obviously is a change with a creatinine that is increased to above 6 mg/dL.  Noted that she is hypotensive and does take ACE inhibitors, nonsteroidal anti-inflammatory drugs, Cox 2 inhibitors, diuretics in one form or other.  Urinalysis was bland although did show cysts 6-10 WBCs per high-power field with many  bacteria.  Renal ultrasound did not reveal any evidence of hydronephrosis Recent 2D echo did not reveal any pericardial effusions or tamponade physiology.  It may be worthwhile checking another 2D echo  for comparison.  Would avoid nephrotoxins. 2. Hypertension/volume  -appears to have some volume depletion.  Stop antihypertensive medications and cautiously hydrate. 3.  Metabolic acidosis.  Believe we need to correct with IV sodium bicarbonate at 100 cc/h.    4.  Breast cancer-chemotherapy on hold until stabilization of renal function. 5.  Hyponatremia.  I suspect this is appropriate in the setting of hypotension.  Correction of hypotension and restoration of volume with isotonic saline should help correct this.  We will follow.    LOS: [place] @TODAY @8 :10 AM

## 2020-01-23 NOTE — Plan of Care (Signed)
  Problem: Health Behavior/Discharge Planning: Goal: Ability to manage health-related needs will improve Outcome: Progressing   Problem: Clinical Measurements: Goal: Will remain free from infection Outcome: Progressing   Problem: Activity: Goal: Risk for activity intolerance will decrease Outcome: Progressing   Problem: Nutrition: Goal: Adequate nutrition will be maintained Outcome: Progressing   Problem: Safety: Goal: Ability to remain free from injury will improve Outcome: Progressing   Problem: Skin Integrity: Goal: Risk for impaired skin integrity will decrease Outcome: Progressing   

## 2020-01-23 NOTE — Progress Notes (Signed)
PHARMACY - PHYSICIAN COMMUNICATION CRITICAL VALUE ALERT - BLOOD CULTURE IDENTIFICATION (BCID)  [name] is an 64 y.o. female who presented to [facility] on 01/22/2020 with a chief complaint of hypotension.  Assessment: Pt sent to ED from cancer center for hypotension. BCID 2/2 enterococcus species, no resistance detected.  Of note, only 1 set of blood cultures were obtained and they were obtained from port-a-cath.  Name of physician (or Provider) Contacted: Dr. Neysa Bonito  Current antibiotics: None  Changes to prescribed antibiotics: Initiate ampicillin.   Results for orders placed or performed during the hospital encounter of 01/22/20  Blood Culture ID Panel (Reflexed) (Collected: 01/22/2020 12:09 PM)  Result Value Ref Range   Enterococcus species DETECTED (A) NOT DETECTED   Vancomycin resistance NOT DETECTED NOT DETECTED   Listeria monocytogenes NOT DETECTED NOT DETECTED   Staphylococcus species NOT DETECTED NOT DETECTED   Staphylococcus aureus (BCID) NOT DETECTED NOT DETECTED   Streptococcus species NOT DETECTED NOT DETECTED   Streptococcus agalactiae NOT DETECTED NOT DETECTED   Streptococcus pneumoniae NOT DETECTED NOT DETECTED   Streptococcus pyogenes NOT DETECTED NOT DETECTED   Acinetobacter baumannii NOT DETECTED NOT DETECTED   Enterobacteriaceae species NOT DETECTED NOT DETECTED   Enterobacter cloacae complex NOT DETECTED NOT DETECTED   Escherichia coli NOT DETECTED NOT DETECTED   Klebsiella oxytoca NOT DETECTED NOT DETECTED   Klebsiella pneumoniae NOT DETECTED NOT DETECTED   Proteus species NOT DETECTED NOT DETECTED   Serratia marcescens NOT DETECTED NOT DETECTED   Haemophilus influenzae NOT DETECTED NOT DETECTED   Neisseria meningitidis NOT DETECTED NOT DETECTED   Pseudomonas aeruginosa NOT DETECTED NOT DETECTED   Candida albicans NOT DETECTED NOT DETECTED   Candida glabrata NOT DETECTED NOT DETECTED   Candida krusei NOT DETECTED NOT DETECTED   Candida parapsilosis  NOT DETECTED NOT DETECTED   Candida tropicalis NOT DETECTED NOT DETECTED    Lenis Noon, PharmD, BCPS 01/23/2020  9:17 AM

## 2020-01-23 NOTE — Consult Note (Signed)
[facility]       Reason for Consult: Enterococcus positive blood culture    Referring Physician: CHAMP autoconsult  Active Problems:   Essential hypertension   S/P left mastectomy   Acute renal failure (ARF) (HCC)   Acute hyponatremia   . Chlorhexidine Gluconate Cloth  6 each Topical Daily  . heparin  5,000 Units Subcutaneous Q8H  . metoprolol tartrate  50 mg Oral BID    Recommendations: Ampicillin Repeat blood cultures (sent about the same time as starting antibiotics)    Assessment: She came in with concerns for renal failure with a creat of 6 and a blood culture, 1 set through the port-a-cath, was sent and now positive for Enterococcus.  No fever, chills, leukocytosis or other signs of infection and it is unclear why the blood culture was sent and why no peripheral blood culture.    At this time, unless there are more positive findings, I do not feel port-a-cath removal is indicated or further evaluation.  Will continue to monitor.   Antibiotics: Starting ampicillin  HPI: [name] is a 64 y.o. female with breast cancer s/p mastectomy, came in with acute on chronic renal failure. As above, a blood cultures was drawn through the port-a-cath and now positive.  No fever, no leukocytosis. Due to start chemotherapy yesterday but unable due to the renal failure.  No diarrhea.  No dysuria   Review of Systems:  Constitutional: negative for fevers and chills Respiratory: negative for cough or sputum Gastrointestinal: negative for nausea and diarrhea Genitourinary: negative for frequency and dysuria Integument/breast: negative for rash All other systems reviewed and are negative    Past Medical History:  Diagnosis Date  . Arthritis   . Cancer ([place]) 11/2019   Left breast  . Diverticulitis   . Family history of ovarian cancer   . Heart murmur    11/26/19 echo: Mild AS. AV mean gradient 12.0 mmHg; however, LVOT gradient 8 mmHg with  intracvitary gradient-significant AS is not suspected  . Hypertension     Social History   Tobacco Use  . Smoking status: Never Smoker  . Smokeless tobacco: Never Used  Substance Use Topics  . Alcohol use: Yes    Alcohol/week: 3.0 standard drinks    Types: 1 Cans of beer, 2 Shots of liquor per week    Comment: daily  . Drug use: Yes    Types: Marijuana    Family History  Problem Relation Age of Onset  . Hypertension Mother   . Dementia Mother   . Ovarian cancer Half-Sister 69  . Diabetes Half-Sister   . Stroke Half-Sister     Allergies  Allergen Reactions  . Amlodipine Swelling    BLE edema    Physical Exam: Constitutional: in no apparent distress  Vitals:   01/23/20 0041 01/23/20 0523  BP: 93/60 (!) 103/49  Pulse: 72 77  Resp: 20 17  Temp: (!) 97.5 F (36.4 C) 97.6 F (36.4 C)  SpO2: 100% 100%   EYES: anicteric Cardiovascular: Cor RRR Respiratory: clear Chest: PAC with no surrounding erythema, no tenderness GI: obese Musculoskeletal: no pedal edema noted Skin: negatives: no rash Neuro: non-focal  Lab Results  Component Value Date   WBC 6.5 01/23/2020   HGB 7.8 (L) 01/23/2020   HCT 22.3 (L) 01/23/2020   MCV 94.5 01/23/2020   PLT 208 01/23/2020    Lab Results  Component Value Date   CREATININE 4.36 (H) 01/23/2020   BUN 80 (  H) 01/23/2020   NA 120 (L) 01/23/2020   K 5.0 01/23/2020   CL 87 (L) 01/23/2020   CO2 21 (L) 01/23/2020    Lab Results  Component Value Date   ALT 33 01/23/2020   AST 43 (H) 01/23/2020   ALKPHOS 49 01/23/2020     Microbiology: Recent Results (from the past 240 hour(s))  Culture, blood (Routine X 2) w Reflex to ID Panel     Status: None (Preliminary result)   Collection Time: 01/22/20 12:09 PM   Specimen: BLOOD  Result Value Ref Range Status   Specimen Description   Final    BLOOD PORTA CATH Performed at [facility], [place] [address], [place], [place] [address]    Special Requests   Final    BOTTLES DRAWN  AEROBIC AND ANAEROBIC Blood Culture adequate volume Performed at [facility] [address], [place], [place] [address]    Culture  Setup Time   Final    GRAM POSITIVE COCCI IN PAIRS IN CHAINS IN BOTH AEROBIC AND ANAEROBIC BOTTLES Organism ID to follow CRITICAL RESULT CALLED TO, READ BACK BY AND VERIFIED WITH: Karie Kirks PharmD 9:00 01/23/20 ([email or web address]) Performed at [facility], [place] [address] [place], [place] [address]    Culture GRAM POSITIVE COCCI  Final   Report Status PENDING  Incomplete  Blood Culture ID Panel (Reflexed)     Status: Abnormal   Collection Time: 01/22/20 12:09 PM  Result Value Ref Range Status   Enterococcus species DETECTED (A) NOT DETECTED Final    Comment: CRITICAL RESULT CALLED TO, READ BACK BY AND VERIFIED WITH: Karie Kirks PharmD 9:00 01/23/20 ([email or web address])    Vancomycin resistance NOT DETECTED NOT DETECTED Final   Listeria monocytogenes NOT DETECTED NOT DETECTED Final   Staphylococcus species NOT DETECTED NOT DETECTED Final   Staphylococcus aureus (BCID) NOT DETECTED NOT DETECTED Final   Streptococcus species NOT DETECTED NOT DETECTED Final   Streptococcus agalactiae NOT DETECTED NOT DETECTED Final   Streptococcus pneumoniae NOT DETECTED NOT DETECTED Final   Streptococcus pyogenes NOT DETECTED NOT DETECTED Final   Acinetobacter baumannii NOT DETECTED NOT DETECTED Final   Enterobacteriaceae species NOT DETECTED NOT DETECTED Final   Enterobacter cloacae complex NOT DETECTED NOT DETECTED Final   Escherichia coli NOT DETECTED NOT DETECTED Final   Klebsiella oxytoca NOT DETECTED NOT DETECTED Final   Klebsiella pneumoniae NOT DETECTED NOT DETECTED Final   Proteus species NOT DETECTED NOT DETECTED Final   Serratia marcescens NOT DETECTED NOT DETECTED Final   Haemophilus influenzae NOT DETECTED NOT DETECTED Final   Neisseria meningitidis NOT DETECTED NOT DETECTED Final   Pseudomonas aeruginosa NOT DETECTED NOT DETECTED Final   Candida albicans  NOT DETECTED NOT DETECTED Final   Candida glabrata NOT DETECTED NOT DETECTED Final   Candida krusei NOT DETECTED NOT DETECTED Final   Candida parapsilosis NOT DETECTED NOT DETECTED Final   Candida tropicalis NOT DETECTED NOT DETECTED Final    Comment: Performed at [facility], [address], [place], [name] [address]  SARS Coronavirus 2 by RT PCR (hospital order, performed in [facility] hospital lab) Nasopharyngeal Nasopharyngeal Swab     Status: None   Collection Time: 01/22/20 12:10 PM   Specimen: Nasopharyngeal Swab  Result Value Ref Range Status   SARS Coronavirus 2 NEGATIVE NEGATIVE Final    Comment: (NOTE) SARS-CoV-2 target nucleic acids are NOT DETECTED. The SARS-CoV-2 RNA is generally detectable in upper and lower respiratory specimens during the  acute phase of infection. The lowest concentration of SARS-CoV-2 viral copies this assay can detect is 250 copies / mL. A negative result does not preclude SARS-CoV-2 infection and should not be used as the sole basis for treatment or other patient management decisions.  A negative result may occur with improper specimen collection / handling, submission of specimen other than nasopharyngeal swab, presence of viral mutation(s) within the areas targeted by this assay, and inadequate number of viral copies (<250 copies / mL). A negative result must be combined with clinical observations, patient history, and epidemiological information. Fact Sheet for Patients:   StrictlyIdeas.no Fact Sheet for Healthcare Providers: BankingDealers.co.za This test is not yet approved or cleared  by the [place] FDA and has been authorized for detection and/or diagnosis of SARS-CoV-2 by FDA under an Emergency Use Authorization (EUA).  This EUA will remain in effect (meaning this test can be used) for the duration of the COVID-19 declaration under Section 564(b)(1) of the Act, 21 U.S.C. section  360bbb-3(b)(1), unless the authorization is terminated or revoked sooner. Performed at [facility], [place] [address], [place], [place] [address]   Culture, blood (routine x 2)     Status: None (Preliminary result)   Collection Time: 01/23/20 10:25 AM   Specimen: BLOOD  Result Value Ref Range Status   Specimen Description   Final    BLOOD RIGHT ARM Performed at [facility] [address] [place], [place] [address]    Special Requests   Final    BOTTLES DRAWN AEROBIC AND ANAEROBIC Blood Culture adequate volume Performed at [facility] [address], [place], [place] [address]    Culture   Final    NO GROWTH < 12 HOURS Performed at [facility] [address] [place], [place] [address]    Report Status PENDING  Incomplete  Culture, blood (routine x 2)     Status: None (Preliminary result)   Collection Time: 01/23/20 10:35 AM   Specimen: BLOOD RIGHT HAND  Result Value Ref Range Status   Specimen Description   Final    BLOOD RIGHT HAND Performed at [facility] [address], [place], [place] [address]    Special Requests   Final    BOTTLES DRAWN AEROBIC AND ANAEROBIC Blood Culture adequate volume Performed at [facility] [address], [place], [place] [address]    Culture   Final    NO GROWTH < 12 HOURS Performed at [facility] [address], [place], [place] [address]    Report Status PENDING  Incomplete    Thayer Headings, [facility] [place] [email or web address] 01/23/2020, 2:08 PM

## 2020-01-23 NOTE — Progress Notes (Signed)
PROGRESS NOTE    Tyrihanna Wingert    Code Status: DNR  KVQ:[id number] DOB: Nov 18, 1955 DOA: 01/22/2020 LOS: 1 days  PCP: [name], MD CC:  Chief Complaint  Patient presents with  . AKI       Hospital Summary   This is a 64 year old female with history of left breast cancer s/p mastectomy, diverticulitis, hypertension, CKD 3b who presented to the ED from cancer center prior to receiving chemo after she was found to have low blood pressure.  She was given IV fluids which helped her BP but labs showed significantly elevated creatinine and diagnosed with AKI on CKD.  Nephrology was consulted.  She was noted to be on multiple nephrotoxic agents including Benzapril, HCTZ, NSAIDs and also Lasix was stopped by a physician about a week prior to admission.  ED Course: Upon arrival to ED, her blood pressure was fairly normal at 112/71.  There is one reading of 41/32 that is an error.  Patient is asymptomatic.  BMP was repeated which showed creatinine of 6.02 with sodium of 119.  EDP already consulted nephrology and then called hospital service for admission.  5/26: Blood culture positive for Enterococcus and started on ampicillin.  ID consulted  A & P   Active Problems:   Essential hypertension   S/P left mastectomy   Acute renal failure (ARF) (HCC)   Acute hyponatremia   1. AKI on CKD 3b a. Multifactorial due to nephrotoxic agents and likely volume depletion b. Improved with NS and holding nephrotoxic agents c. Tarted on sodium bicarbonate d. Appreciate further nephrology recommendations  2. Hyponatremia a. Started on sodium bicarbonate per nephrology  3. Enterococcus bacteremia a. Afebrile without leukocytosis b. Started on ampicillin c. ID consulted d. Follow-up repeat blood cultures e. Echo 5/25 without vegetations noted  4. Hypertension/hypotensive episode a. Holding antihypertensives and continue volume repletion  5. Metabolic acidosis a. Continue sodium bicarbonate  6.  Breast cancer a. Sent in from cancer center b. Follow-up with outpatient oncology  DVT prophylaxis: Heparin Family Communication: Patient updated at bedside Disposition Plan:  Status is: Inpatient  Remains inpatient appropriate because:IV treatments appropriate due to intensity of illness or inability to take PO   Dispo: The patient is from: Home              Anticipated d/c is to: Home              Anticipated d/c date is: 3 days              Patient currently is not medically stable to d/c.           Pressure injury documentation   Pressure Injury 12/19/19 Tibial Distal;Left;Posterior Stage 2 -  Partial thickness loss of dermis presenting as a shallow open injury with a red, pink wound bed without slough. Pink, red (Active)  12/19/19 1903  Location: Tibial  Location Orientation: Distal;Left;Posterior  Staging: Stage 2 -  Partial thickness loss of dermis presenting as a shallow open injury with a red, pink wound bed without slough.  Wound Description (Comments): Pink, red  Present on Admission: Yes     Pressure Injury 12/19/19 Sacrum Medial Stage 2 -  Partial thickness loss of dermis presenting as a shallow open injury with a red, pink wound bed without slough. Pink, red (Active)  12/19/19 1904  Location: Sacrum  Location Orientation: Medial  Staging: Stage 2 -  Partial thickness loss of dermis presenting as a shallow open injury with a red, pink  wound bed without slough.  Wound Description (Comments): Pink, red  Present on Admission: Yes   None  Consultants  Nephrology   Procedures  None  Antibiotics   Anti-infectives (From admission, onward)   Start     Dose/Rate Route Frequency Ordered Stop   01/23/20 1000  ampicillin (OMNIPEN) 2 g in sodium chloride 0.9 % 100 mL IVPB     2 g 300 mL/hr over 20 Minutes Intravenous Every 8 hours 01/23/20 0924          Subjective   Patient seen and examined at bedside in no acute distress and resting comfortably. No  acute events overnight. Denies any acute complaints at this time. Tolerating diet well.   Objective   Vitals:   01/22/20 2327 01/23/20 0041 01/23/20 0523 01/23/20 1559  BP:  93/60 (!) 103/49 (!) 110/51  Pulse:  72 77 93  Resp:  20 17 16   Temp:  (!) 97.5 F (36.4 C) 97.6 F (36.4 C) 99.2 F (37.3 C)  TempSrc:  Oral Oral Oral  SpO2:  100% 100% 99%  Weight: (!) 137 kg     Height: 5\' 1"  (1.549 m)       Intake/Output Summary (Last 24 hours) at 01/23/2020 1641 Last data filed at 01/23/2020 0930 Gross per 24 hour  Intake 120 ml  Output -  Net 120 ml   Filed Weights   01/22/20 2327  Weight: (!) 137 kg    Examination:  Physical Exam Vitals and nursing note reviewed.  Constitutional:      Appearance: Normal appearance.  HENT:     Head: Normocephalic and atraumatic.  Eyes:     Conjunctiva/sclera: Conjunctivae normal.  Cardiovascular:     Rate and Rhythm: Normal rate and regular rhythm.     Comments: 2/6 systolic murmur Pulmonary:     Effort: Pulmonary effort is normal.     Breath sounds: Normal breath sounds.  Abdominal:     General: Abdomen is flat.     Palpations: Abdomen is soft.  Musculoskeletal:        General: No swelling or tenderness.  Skin:    Coloration: Skin is not jaundiced or pale.  Neurological:     Mental Status: She is alert. Mental status is at baseline.  Psychiatric:        Mood and Affect: Mood normal.        Behavior: Behavior normal.     Data Reviewed: I have personally reviewed following labs and imaging studies  CBC: Recent Labs  Lab 01/22/20 0840 01/22/20 1400 01/23/20 0531  WBC 6.3 5.9 6.5  NEUTROABS 2.8  --   --   HGB 8.4* 8.3* 7.8*  HCT 23.8* 25.1* 22.3*  MCV 92.2 96.2 94.5  PLT 210 203 937   Basic Metabolic Panel: Recent Labs  Lab 01/22/20 0840 01/22/20 1400 01/23/20 0531  NA 119*  --  120*  K 4.8  --  5.0  CL 89*  --  87*  CO2 16*  --  21*  GLUCOSE 119*  --  101*  BUN 74*  --  80*  CREATININE 6.02* 5.76* 4.36*   CALCIUM 8.4*  --  8.0*  MG  --  1.3*  --   PHOS  --   --  4.6   GFR: Estimated Creatinine Clearance: 17.2 mL/min (A) (by C-G formula based on SCr of 4.36 mg/dL (H)). Liver Function Tests: Recent Labs  Lab 01/22/20 0840 01/23/20 0531  AST 35 43*  ALT 34 33  ALKPHOS 62 49  BILITOT 0.2* 0.3  PROT 7.2 6.4*  ALBUMIN 2.7* 2.7*   No results for input(s): LIPASE, AMYLASE in the last 168 hours. No results for input(s): AMMONIA in the last 168 hours. Coagulation Profile: No results for input(s): INR, PROTIME in the last 168 hours. Cardiac Enzymes: No results for input(s): CKTOTAL, CKMB, CKMBINDEX, TROPONINI in the last 168 hours. BNP (last 3 results) No results for input(s): PROBNP in the last 8760 hours. HbA1C: No results for input(s): HGBA1C in the last 72 hours. CBG: No results for input(s): GLUCAP in the last 168 hours. Lipid Profile: No results for input(s): CHOL, HDL, LDLCALC, TRIG, CHOLHDL, LDLDIRECT in the last 72 hours. Thyroid Function Tests: Recent Labs    01/22/20 1641  TSH 1.802   Anemia Panel: Recent Labs    01/22/20 1641  TIBC 231*  IRON 111   Sepsis Labs: Recent Labs  Lab 01/22/20 1209  LATICACIDVEN 1.5    Recent Results (from the past 240 hour(s))  Culture, blood (Routine X 2) w Reflex to ID Panel     Status: None (Preliminary result)   Collection Time: 01/22/20 12:09 PM   Specimen: BLOOD  Result Value Ref Range Status   Specimen Description   Final    BLOOD PORTA CATH Performed at [facility] [address], [place], [place] [address]    Special Requests   Final    BOTTLES DRAWN AEROBIC AND ANAEROBIC Blood Culture adequate volume Performed at [facility] [address], [place], [place] [address]    Culture  Setup Time   Final    GRAM POSITIVE COCCI IN PAIRS IN CHAINS IN BOTH AEROBIC AND ANAEROBIC BOTTLES Organism ID to follow CRITICAL RESULT CALLED TO, READ BACK BY AND VERIFIED WITH: Karie Kirks PharmD 9:00  01/23/20 ([email or web address]) Performed at [facility], [place] [address], [place], [place] [address]    Culture GRAM POSITIVE COCCI  Final   Report Status PENDING  Incomplete  Blood Culture ID Panel (Reflexed)     Status: Abnormal   Collection Time: 01/22/20 12:09 PM  Result Value Ref Range Status   Enterococcus species DETECTED (A) NOT DETECTED Final    Comment: CRITICAL RESULT CALLED TO, READ BACK BY AND VERIFIED WITH: Karie Kirks PharmD 9:00 01/23/20 ([email or web address])    Vancomycin resistance NOT DETECTED NOT DETECTED Final   Listeria monocytogenes NOT DETECTED NOT DETECTED Final   Staphylococcus species NOT DETECTED NOT DETECTED Final   Staphylococcus aureus (BCID) NOT DETECTED NOT DETECTED Final   Streptococcus species NOT DETECTED NOT DETECTED Final   Streptococcus agalactiae NOT DETECTED NOT DETECTED Final   Streptococcus pneumoniae NOT DETECTED NOT DETECTED Final   Streptococcus pyogenes NOT DETECTED NOT DETECTED Final   Acinetobacter baumannii NOT DETECTED NOT DETECTED Final   Enterobacteriaceae species NOT DETECTED NOT DETECTED Final   Enterobacter cloacae complex NOT DETECTED NOT DETECTED Final   Escherichia coli NOT DETECTED NOT DETECTED Final   Klebsiella oxytoca NOT DETECTED NOT DETECTED Final   Klebsiella pneumoniae NOT DETECTED NOT DETECTED Final   Proteus species NOT DETECTED NOT DETECTED Final   Serratia marcescens NOT DETECTED NOT DETECTED Final   Haemophilus influenzae NOT DETECTED NOT DETECTED Final   Neisseria meningitidis NOT DETECTED NOT DETECTED Final   Pseudomonas aeruginosa NOT DETECTED NOT DETECTED Final   Candida albicans NOT DETECTED NOT DETECTED Final   Candida glabrata NOT DETECTED NOT DETECTED Final   Candida krusei NOT DETECTED NOT DETECTED Final  Candida parapsilosis NOT DETECTED NOT DETECTED Final   Candida tropicalis NOT DETECTED NOT DETECTED Final    Comment: Performed at [facility], [place] [address], [place], [place] [address]  SARS Coronavirus 2 by RT  PCR (hospital order, performed in [facility] hospital lab) Nasopharyngeal Nasopharyngeal Swab     Status: None   Collection Time: 01/22/20 12:10 PM   Specimen: Nasopharyngeal Swab  Result Value Ref Range Status   SARS Coronavirus 2 NEGATIVE NEGATIVE Final    Comment: (NOTE) SARS-CoV-2 target nucleic acids are NOT DETECTED. The SARS-CoV-2 RNA is generally detectable in upper and lower respiratory specimens during the acute phase of infection. The lowest concentration of SARS-CoV-2 viral copies this assay can detect is 250 copies / mL. A negative result does not preclude SARS-CoV-2 infection and should not be used as the sole basis for treatment or other patient management decisions.  A negative result may occur with improper specimen collection / handling, submission of specimen other than nasopharyngeal swab, presence of viral mutation(s) within the areas targeted by this assay, and inadequate number of viral copies (<250 copies / mL). A negative result must be combined with clinical observations, patient history, and epidemiological information. Fact Sheet for Patients:   StrictlyIdeas.no Fact Sheet for Healthcare Providers: BankingDealers.co.za This test is not yet approved or cleared  by the [place] FDA and has been authorized for detection and/or diagnosis of SARS-CoV-2 by FDA under an Emergency Use Authorization (EUA).  This EUA will remain in effect (meaning this test can be used) for the duration of the COVID-19 declaration under Section 564(b)(1) of the Act, 21 U.S.C. section 360bbb-3(b)(1), unless the authorization is terminated or revoked sooner. Performed at [facility], [place] [address], [place], [place] [address]   Culture, blood (routine x 2)     Status: None (Preliminary result)   Collection Time: 01/23/20 10:25 AM   Specimen: BLOOD  Result Value Ref Range Status   Specimen Description   Final     BLOOD RIGHT ARM Performed at [facility] [address] [place], [place] [address]    Special Requests   Final    BOTTLES DRAWN AEROBIC AND ANAEROBIC Blood Culture adequate volume Performed at [facility] [address], [place], [place] [address]    Culture   Final    NO GROWTH < 12 HOURS Performed at [facility] [address] [place], [place] [address]    Report Status PENDING  Incomplete  Culture, blood (routine x 2)     Status: None (Preliminary result)   Collection Time: 01/23/20 10:35 AM   Specimen: BLOOD RIGHT HAND  Result Value Ref Range Status   Specimen Description   Final    BLOOD RIGHT HAND Performed at [facility] [address] [place], [place] [address]    Special Requests   Final    BOTTLES DRAWN AEROBIC AND ANAEROBIC Blood Culture adequate volume Performed at [facility] [address] [place],  [address]    Culture   Final    NO GROWTH < 12 HOURS Performed at [facility] [address] [place],  [address]    Report Status PENDING  Incomplete         Radiology Studies: US RENAL  Result Date: 01/22/2020 CLINICAL DATA:  Acute renal failure. EXAM: RENAL / URINARY TRACT ULTRASOUND COMPLETE COMPARISON:  None. FINDINGS: Right Kidney: Renal measurements: 12.6 x 5.4  x 6.4 cm = volume: 224 mL . Echogenicity within normal limits. No mass or hydronephrosis visualized. Left Kidney: Renal measurements: 11.2 x 6.0 x 5.7 cm = volume: 202 mL. Echogenicity within normal limits. No mass or hydronephrosis visualized. Bladder: Appears normal for degree of bladder distention. Other: None. IMPRESSION: Normal renal ultrasound. Electronically Signed   By: Lajean Manes M.D.   On: 01/22/2020 14:46   ECHOCARDIOGRAM LIMITED  Result Date: 01/22/2020    ECHOCARDIOGRAM LIMITED REPORT   Patient Name:   KATHLENE YANO Date of Exam: 01/22/2020 Medical Rec #:  [id number]      Height:       61.0 in Accession #:    [id number]    Weight:       303.9 lb Date of Birth:  03-08-1956    BSA:          2.256 m Patient Age:    21 years      BP:           92/60 mmHg Patient Gender: F             HR:           73 bpm. Exam Location:  Inpatient Procedure: Limited Echo Indications:    Pericardial effusion  History:        Patient has prior history of Echocardiogram examinations, most                 recent 11/26/2019. Risk Factors:Hypertension. Breast cancer, s/p                 mastectomy.  Sonographer:    Dustin Flock Referring Phys: [place]  1. Left ventricular ejection fraction, by estimation, is 65 to 70%. The left ventricle has normal function. The left ventricle has no regional wall motion abnormalities. There is mild left ventricular hypertrophy.  2. Right ventricular systolic function is normal. The right ventricular size is normal.  3. The inferior vena cava is normal in size with greater than 50% respiratory variability, suggesting right atrial pressure of 3 mmHg.  4. No pericardial effusion.  5. Limited echo with no doppler. FINDINGS  Left Ventricle: Left ventricular ejection fraction, by estimation, is 65 to 70%. The left ventricle has normal function. The left ventricle has no regional wall motion abnormalities. The left ventricular internal cavity size was normal in size. There is  mild left ventricular hypertrophy. Right Ventricle: The right ventricular size is normal. No increase in right ventricular wall thickness. Right ventricular systolic function is normal. Left Atrium: Left atrial size was normal in size. Right Atrium: Right atrial size was normal in size. Pericardium: There is no evidence of pericardial effusion. Venous: The inferior vena cava is normal in size with greater than 50% respiratory variability, suggesting right atrial pressure of 3 mmHg. IAS/Shunts: No atrial level shunt detected by color flow Doppler.  LEFT VENTRICLE PLAX 2D LVIDd:          3.30 cm LVIDs:         1.80 cm LV PW:         1.50 cm LV IVS:        1.30 cm  Loralie Champagne MD Electronically signed by Loralie Champagne MD Signature Date/Time: 01/22/2020/3:19:05 PM    Final         Scheduled Meds: . Chlorhexidine Gluconate Cloth  6 each Topical Daily  . heparin  5,000 Units Subcutaneous Q8H  . metoprolol tartrate  50 mg Oral BID  Continuous Infusions: . ampicillin (OMNIPEN) IV 2 g (01/23/20 1134)  .  sodium bicarbonate (isotonic) infusion in sterile water 100 mL/hr at 01/23/20 0344     Time spent: 26  minutes with over 50% of the time coordinating the patient's care    Harold Hedge, DO [facility] Pager [phone]  Call night coverage person covering after 7pm

## 2020-01-24 ENCOUNTER — Inpatient Hospital Stay: Payer: Medicaid Other

## 2020-01-24 DIAGNOSIS — C773 Secondary and unspecified malignant neoplasm of axilla and upper limb lymph nodes: Secondary | ICD-10-CM

## 2020-01-24 DIAGNOSIS — A4181 Sepsis due to Enterococcus: Secondary | ICD-10-CM

## 2020-01-24 DIAGNOSIS — N1832 Chronic kidney disease, stage 3b: Secondary | ICD-10-CM

## 2020-01-24 DIAGNOSIS — C50412 Malignant neoplasm of upper-outer quadrant of left female breast: Secondary | ICD-10-CM

## 2020-01-24 DIAGNOSIS — Z170 Estrogen receptor positive status [ER+]: Secondary | ICD-10-CM

## 2020-01-24 DIAGNOSIS — I959 Hypotension, unspecified: Secondary | ICD-10-CM

## 2020-01-24 LAB — BASIC METABOLIC PANEL
Anion gap: 12 (ref 5–15)
Anion gap: 13 (ref 5–15)
Anion gap: 13 (ref 5–15)
BUN: 74 mg/dL — ABNORMAL HIGH (ref 8–23)
BUN: 72 mg/dL — ABNORMAL HIGH (ref 8–23)
BUN: 67 mg/dL — ABNORMAL HIGH (ref 8–23)
CO2: 28 mmol/L (ref 22–32)
CO2: 26 mmol/L (ref 22–32)
CO2: 26 mmol/L (ref 22–32)
Calcium: 8 mg/dL — ABNORMAL LOW (ref 8.9–10.3)
Calcium: 8 mg/dL — ABNORMAL LOW (ref 8.9–10.3)
Calcium: 7.9 mg/dL — ABNORMAL LOW (ref 8.9–10.3)
Chloride: 83 mmol/L — ABNORMAL LOW (ref 98–111)
Chloride: 83 mmol/L — ABNORMAL LOW (ref 98–111)
Chloride: 85 mmol/L — ABNORMAL LOW (ref 98–111)
Creatinine, Ser: 3.61 mg/dL — ABNORMAL HIGH (ref 0.44–1.00)
Creatinine, Ser: 3.35 mg/dL — ABNORMAL HIGH (ref 0.44–1.00)
Creatinine, Ser: 3.34 mg/dL — ABNORMAL HIGH (ref 0.44–1.00)
GFR calc Af Amer: 15 mL/min — ABNORMAL LOW (ref >60)
GFR calc Af Amer: 16 mL/min — ABNORMAL LOW (ref >60)
GFR calc Af Amer: 16 mL/min — ABNORMAL LOW (ref >60)
GFR calc non Af Amer: 13 mL/min — ABNORMAL LOW (ref >60)
GFR calc non Af Amer: 14 mL/min — ABNORMAL LOW (ref >60)
GFR calc non Af Amer: 14 mL/min — ABNORMAL LOW (ref >60)
Glucose, Bld: 100 mg/dL — ABNORMAL HIGH (ref 70–99)
Glucose, Bld: 99 mg/dL (ref 70–99)
Glucose, Bld: 123 mg/dL — ABNORMAL HIGH (ref 70–99)
Potassium: 4.4 mmol/L (ref 3.5–5.1)
Potassium: 4.1 mmol/L (ref 3.5–5.1)
Potassium: 4 mmol/L (ref 3.5–5.1)
Sodium: 123 mmol/L — ABNORMAL LOW (ref 135–145)
Sodium: 122 mmol/L — ABNORMAL LOW (ref 135–145)
Sodium: 124 mmol/L — ABNORMAL LOW (ref 135–145)

## 2020-01-24 LAB — RENAL FUNCTION PANEL
Albumin: 2.6 g/dL — ABNORMAL LOW (ref 3.5–5.0)
Anion gap: 12 (ref 5–15)
BUN: 71 mg/dL — ABNORMAL HIGH (ref 8–23)
CO2: 26 mmol/L (ref 22–32)
Calcium: 7.3 mg/dL — ABNORMAL LOW (ref 8.9–10.3)
Chloride: 80 mmol/L — ABNORMAL LOW (ref 98–111)
Creatinine, Ser: 4.04 mg/dL — ABNORMAL HIGH (ref 0.44–1.00)
GFR calc Af Amer: 13 mL/min — ABNORMAL LOW (ref >60)
GFR calc non Af Amer: 11 mL/min — ABNORMAL LOW (ref >60)
Glucose, Bld: 93 mg/dL (ref 70–99)
Phosphorus: 4.1 mg/dL (ref 2.5–4.6)
Potassium: 4.6 mmol/L (ref 3.5–5.1)
Sodium: 118 mmol/L — CL (ref 135–145)

## 2020-01-24 LAB — CBC
HCT: 25.9 % — ABNORMAL LOW (ref 36.0–46.0)
Hemoglobin: 9 g/dL — ABNORMAL LOW (ref 12.0–15.0)
MCH: 32.4 pg (ref 26.0–34.0)
MCHC: 34.7 g/dL (ref 30.0–36.0)
MCV: 93.2 fL (ref 80.0–100.0)
Platelets: 188 10*3/uL (ref 150–400)
RBC: 2.78 MIL/uL — ABNORMAL LOW (ref 3.87–5.11)
RDW: 12.5 % (ref 11.5–15.5)
WBC: 7.2 10*3/uL (ref 4.0–10.5)
nRBC: 0 % (ref 0.0–0.2)

## 2020-01-24 MED ORDER — FUROSEMIDE 10 MG/ML IJ SOLN
80.0000 mg | Freq: Two times a day (BID) | INTRAMUSCULAR | Status: DC
  Administered 2020-01-24 (×2): 80 mg via INTRAVENOUS
  Filled 2020-01-24 (×2): qty 8

## 2020-01-24 MED ORDER — SODIUM CHLORIDE 0.9 % IV SOLN: INTRAVENOUS | Status: AC

## 2020-01-24 NOTE — Progress Notes (Signed)
PROGRESS NOTE    [name]    Code Status: DNR  IOX:[id number] DOB: 10-01-1955 DOA: 01/22/2020 LOS: 2 days  PCP: [name], MD CC:  Chief Complaint  Patient presents with  . AKI       Hospital Summary   This is a 64 year old female with history of left breast cancer s/p mastectomy, diverticulitis, hypertension, CKD 3b who presented to the ED from cancer center prior to receiving chemo after she was found to have low blood pressure.  She was given IV fluids which helped her BP but labs showed significantly elevated creatinine and diagnosed with AKI on CKD.  Nephrology was consulted.  She was noted to be on multiple nephrotoxic agents including Benzapril, HCTZ, NSAIDs and also Lasix was stopped by a physician about a week prior to admission.  ED Course: Upon arrival to ED, her blood pressure was fairly normal at 112/71.  There is one reading of 41/32 that is an error.  Patient is asymptomatic.  BMP was repeated which showed creatinine of 6.02 with sodium of 119.  EDP already consulted nephrology and then called hospital service for admission.  5/26: Blood culture positive for Enterococcus and started on ampicillin.  ID consulted  A & P   Active Problems:   Essential hypertension   S/P left mastectomy   Acute renal failure (ARF) (HCC)   Acute hyponatremia   1. AKI on CKD 3b a. Multifactorial due to nephrotoxic agents and likely volume depletion b. Nephrology to discontinue sodium bicarbonate c. Trial of Lasix d. Appreciate further nephrology recommendations  2. Hyponatremia a. Worse this a.m. at 118 which improved with Lasix to 123 b. Started on sodium bicarbonate per nephrology which has since been discontinued today c. Trial of Lasix d. Team to trend  3. Enterococcus bacteremia in setting of a Port-A-Cath a. Afebrile without leukocytosis b. Started on ampicillin c. ID consulted d. Follow-up repeat blood cultures e. Echo 5/25 without vegetations  noted  4. Hypertension/hypotensive episode a. Holding antihypertensives and continue volume repletion b. Nephrology checking cortisol  5. Metabolic acidosis resolved with sodium bicarb  6. Breast cancer a. Sent in from cancer center prior to starting her chemotherapy b. Follow-up with outpatient oncology  DVT prophylaxis: Heparin Family Communication: Patient updated at bedside Disposition Plan:  Status is: Inpatient  Remains inpatient appropriate because:IV treatments appropriate due to intensity of illness or inability to take PO   Dispo: The patient is from: Home              Anticipated d/c is to: Home              Anticipated d/c date is: 3 days              Patient currently is not medically stable to d/c.           Pressure injury documentation   Pressure Injury 12/19/19 Tibial Distal;Left;Posterior Stage 2 -  Partial thickness loss of dermis presenting as a shallow open injury with a red, pink wound bed without slough. Pink, red (Active)  12/19/19 1903  Location: Tibial  Location Orientation: Distal;Left;Posterior  Staging: Stage 2 -  Partial thickness loss of dermis presenting as a shallow open injury with a red, pink wound bed without slough.  Wound Description (Comments): Pink, red  Present on Admission: Yes     Pressure Injury 12/19/19 Sacrum Medial Stage 2 -  Partial thickness loss of dermis presenting as a shallow open injury with a red, pink wound  bed without slough. Pink, red (Active)  12/19/19 1904  Location: Sacrum  Location Orientation: Medial  Staging: Stage 2 -  Partial thickness loss of dermis presenting as a shallow open injury with a red, pink wound bed without slough.  Wound Description (Comments): Pink, red  Present on Admission: Yes   None  Consultants  Nephrology   Procedures  None  Antibiotics   Anti-infectives (From admission, onward)   Start     Dose/Rate Route Frequency Ordered Stop   01/23/20 1000  ampicillin (OMNIPEN) 2  g in sodium chloride 0.9 % 100 mL IVPB     2 g 300 mL/hr over 20 Minutes Intravenous Every 8 hours 01/23/20 0924          Subjective   Patient seen and examined at bedside no acute distress and resting comfortably.  No events overnight.  Tolerating diet.  Denies any chest pain, shortness of breath, fever, nausea, vomiting, urinary complaints.  Admits to having bowel movement.  Otherwise ROS negative   Objective   Vitals:   01/23/20 1559 01/23/20 2152 01/24/20 0545 01/24/20 1319  BP: (!) 110/51 (!) 92/41 (!) 94/50 (!) 101/57  Pulse: 93 98 96 83  Resp: 16 20 18 17   Temp: 99.2 F (37.3 C) 98.7 F (37.1 C) 99.1 F (37.3 C) 98.5 F (36.9 C)  TempSrc: Oral Oral Oral Oral  SpO2: 99% 98%  97%  Weight:      Height:        Intake/Output Summary (Last 24 hours) at 01/24/2020 1729 Last data filed at 01/23/2020 1900 Gross per 24 hour  Intake 1440 ml  Output --  Net 1440 ml   Filed Weights   01/22/20 2327  Weight: (!) 137 kg    Examination:  Physical Exam Vitals and nursing note reviewed.  Constitutional:      Appearance: Normal appearance.  HENT:     Head: Normocephalic and atraumatic.  Eyes:     Conjunctiva/sclera: Conjunctivae normal.  Cardiovascular:     Rate and Rhythm: Normal rate and regular rhythm.  Pulmonary:     Effort: Pulmonary effort is normal.     Breath sounds: Normal breath sounds.  Abdominal:     General: Abdomen is flat.     Palpations: Abdomen is soft.  Musculoskeletal:        General: No swelling or tenderness.     Comments: Right chest port a cath  Skin:    Coloration: Skin is not jaundiced or pale.  Neurological:     Mental Status: She is alert. Mental status is at baseline.  Psychiatric:        Mood and Affect: Mood normal.        Behavior: Behavior normal.     Data Reviewed: I have personally reviewed following labs and imaging studies  CBC: Recent Labs  Lab 01/22/20 0840 01/22/20 1400 01/23/20 0531 01/24/20 0500  WBC 6.3  5.9 6.5 7.2  NEUTROABS 2.8  --   --   --   HGB 8.4* 8.3* 7.8* 9.0*  HCT 23.8* 25.1* 22.3* 25.9*  MCV 92.2 96.2 94.5 93.2  PLT 210 203 208 347   Basic Metabolic Panel: Recent Labs  Lab 01/22/20 0840 01/22/20 1400 01/23/20 0531 01/24/20 0500 01/24/20 1542  NA 119*  --  120* 118* 123*  K 4.8  --  5.0 4.6 4.4  CL 89*  --  87* 80* 83*  CO2 16*  --  21* 26 28  GLUCOSE 119*  --  101* 93 100*  BUN 74*  --  80* 71* 74*  CREATININE 6.02* 5.76* 4.36* 4.04* 3.61*  CALCIUM 8.4*  --  8.0* 7.3* 8.0*  MG  --  1.3*  --   --   --   PHOS  --   --  4.6 4.1  --    GFR: Estimated Creatinine Clearance: 20.8 mL/min (A) (by C-G formula based on SCr of 3.61 mg/dL (H)). Liver Function Tests: Recent Labs  Lab 01/22/20 0840 01/23/20 0531 01/24/20 0500  AST 35 43*  --   ALT 34 33  --   ALKPHOS 62 49  --   BILITOT 0.2* 0.3  --   PROT 7.2 6.4*  --   ALBUMIN 2.7* 2.7* 2.6*   No results for input(s): LIPASE, AMYLASE in the last 168 hours. No results for input(s): AMMONIA in the last 168 hours. Coagulation Profile: No results for input(s): INR, PROTIME in the last 168 hours. Cardiac Enzymes: No results for input(s): CKTOTAL, CKMB, CKMBINDEX, TROPONINI in the last 168 hours. BNP (last 3 results) No results for input(s): PROBNP in the last 8760 hours. HbA1C: No results for input(s): HGBA1C in the last 72 hours. CBG: No results for input(s): GLUCAP in the last 168 hours. Lipid Profile: No results for input(s): CHOL, HDL, LDLCALC, TRIG, CHOLHDL, LDLDIRECT in the last 72 hours. Thyroid Function Tests: Recent Labs    01/22/20 1641  TSH 1.802   Anemia Panel: Recent Labs    01/22/20 1641  TIBC 231*  IRON 111   Sepsis Labs: Recent Labs  Lab 01/22/20 1209  LATICACIDVEN 1.5    Recent Results (from the past 240 hour(s))  Culture, blood (Routine X 2) w Reflex to ID Panel     Status: Abnormal (Preliminary result)   Collection Time: 01/22/20 12:09 PM   Specimen: BLOOD  Result Value Ref  Range Status   Specimen Description   Final    BLOOD PORTA CATH Performed at [facility], [place] [address] [place], [place] [address]    Special Requests   Final    BOTTLES DRAWN AEROBIC AND ANAEROBIC Blood Culture adequate volume Performed at [facility] [address] [place], [place] [address]    Culture  Setup Time   Final    GRAM POSITIVE COCCI IN PAIRS IN CHAINS IN BOTH AEROBIC AND ANAEROBIC BOTTLES Organism ID to follow CRITICAL RESULT CALLED TO, READ BACK BY AND VERIFIED WITH: Karie Kirks PharmD 9:00 01/23/20 ([email or web address]) Performed at [facility], [place] [address], [place], [place] [address]    Culture ENTEROCOCCUS GALLINARUM (A)  Final   Report Status PENDING  Incomplete  Blood Culture ID Panel (Reflexed)     Status: Abnormal   Collection Time: 01/22/20 12:09 PM  Result Value Ref Range Status   Enterococcus species DETECTED (A) NOT DETECTED Final    Comment: CRITICAL RESULT CALLED TO, READ BACK BY AND VERIFIED WITH: Karie Kirks PharmD 9:00 01/23/20 ([email or web address])    Vancomycin resistance NOT DETECTED NOT DETECTED Final   Listeria monocytogenes NOT DETECTED NOT DETECTED Final   Staphylococcus species NOT DETECTED NOT DETECTED Final   Staphylococcus aureus (BCID) NOT DETECTED NOT DETECTED Final   Streptococcus species NOT DETECTED NOT DETECTED Final   Streptococcus agalactiae NOT DETECTED NOT DETECTED Final   Streptococcus pneumoniae NOT DETECTED NOT DETECTED Final   Streptococcus pyogenes NOT DETECTED NOT DETECTED Final   Acinetobacter baumannii NOT DETECTED NOT DETECTED Final   Enterobacteriaceae species NOT DETECTED NOT  DETECTED Final   Enterobacter cloacae complex NOT DETECTED NOT DETECTED Final   Escherichia coli NOT DETECTED NOT DETECTED Final   Klebsiella oxytoca NOT DETECTED NOT DETECTED Final   Klebsiella pneumoniae NOT DETECTED NOT DETECTED Final   Proteus species NOT DETECTED NOT DETECTED Final   Serratia marcescens NOT DETECTED NOT DETECTED  Final   Haemophilus influenzae NOT DETECTED NOT DETECTED Final   Neisseria meningitidis NOT DETECTED NOT DETECTED Final   Pseudomonas aeruginosa NOT DETECTED NOT DETECTED Final   Candida albicans NOT DETECTED NOT DETECTED Final   Candida glabrata NOT DETECTED NOT DETECTED Final   Candida krusei NOT DETECTED NOT DETECTED Final   Candida parapsilosis NOT DETECTED NOT DETECTED Final   Candida tropicalis NOT DETECTED NOT DETECTED Final    Comment: Performed at [facility], [place] [address] [place], [place] [address]  SARS Coronavirus 2 by RT PCR (hospital order, performed in [facility] hospital lab) Nasopharyngeal Nasopharyngeal Swab     Status: None   Collection Time: 01/22/20 12:10 PM   Specimen: Nasopharyngeal Swab  Result Value Ref Range Status   SARS Coronavirus 2 NEGATIVE NEGATIVE Final    Comment: (NOTE) SARS-CoV-2 target nucleic acids are NOT DETECTED. The SARS-CoV-2 RNA is generally detectable in upper and lower respiratory specimens during the acute phase of infection. The lowest concentration of SARS-CoV-2 viral copies this assay can detect is 250 copies / mL. A negative result does not preclude SARS-CoV-2 infection and should not be used as the sole basis for treatment or other patient management decisions.  A negative result may occur with improper specimen collection / handling, submission of specimen other than nasopharyngeal swab, presence of viral mutation(s) within the areas targeted by this assay, and inadequate number of viral copies (<250 copies / mL). A negative result must be combined with clinical observations, patient history, and epidemiological information. Fact Sheet for Patients:   StrictlyIdeas.no Fact Sheet for Healthcare Providers: BankingDealers.co.za This test is not yet approved or cleared  by the [place] FDA and has been authorized for detection and/or diagnosis of SARS-CoV-2 by FDA under an  Emergency Use Authorization (EUA).  This EUA will remain in effect (meaning this test can be used) for the duration of the COVID-19 declaration under Section 564(b)(1) of the Act, 21 U.S.C. section 360bbb-3(b)(1), unless the authorization is terminated or revoked sooner. Performed at [facility], [place] [address] [place], [place] [address]   Culture, blood (routine x 2)     Status: None (Preliminary result)   Collection Time: 01/23/20 10:25 AM   Specimen: BLOOD  Result Value Ref Range Status   Specimen Description   Final    BLOOD RIGHT ARM Performed at [facility] [address] [place], [place] [address]    Special Requests   Final    BOTTLES DRAWN AEROBIC AND ANAEROBIC Blood Culture adequate volume Performed at [facility] [address], [place], [place] [address]    Culture   Final    NO GROWTH < 24 HOURS Performed at [facility] [address], [place], [place] [address]    Report Status PENDING  Incomplete  Culture, blood (routine x 2)     Status: None (Preliminary result)   Collection Time: 01/23/20 10:35 AM   Specimen: BLOOD RIGHT HAND  Result Value Ref Range Status   Specimen Description   Final    BLOOD RIGHT HAND Performed at [facility]  [address] [place], [place] [address]    Special Requests   Final    BOTTLES DRAWN AEROBIC AND ANAEROBIC Blood Culture adequate volume Performed at [facility] [address] [place], [place] [address]    Culture   Final    NO GROWTH < 24 HOURS Performed at [facility] [address], [place], [place] [address]    Report Status PENDING  Incomplete         Radiology Studies: No results found.      Scheduled Meds: . Chlorhexidine Gluconate Cloth  6 each Topical Daily  . furosemide  80 mg Intravenous Q12H  . heparin  5,000 Units Subcutaneous Q8H   Continuous Infusions: . sodium chloride 75  mL/hr at 01/24/20 0855  . ampicillin (OMNIPEN) IV 2 g (01/24/20 1557)     Time spent: 27 minutes with over 50% of the time coordinating the patient's care    Harold Hedge, DO [facility] Pager [phone]  Call night coverage person covering after 7pm

## 2020-01-24 NOTE — Progress Notes (Signed)
Otto Caraway   DOB:[birth year]   ZD#:[id number]   QVZ#:[id number]  Subjective:  Latoy was to have received her first dose of chemotherapy (dose-reduced AC) 01/22/2020 but was found to be hypotensive; BP did not respond to fluid bolus and her creatinine came back at >6, so she was directed to the ED leading to this admission. Her BP medications have been discontinued and her creatinine is improving. Multiple electrolyte imbalances are being corrected. She was also found to have a positive blood culture for enterococcus; she is currently on ampicillin.   Objective: African American woman examined in bed Vitals:   01/23/20 2152 01/24/20 0545  BP:  (!) 94/50  Pulse:  96  Resp:  18  Temp:  99.1 F (37.3 C)  SpO2: 98%     Body mass index is 57.07 kg/m.  Intake/Output Summary (Last 24 hours) at 01/24/2020 0809 Last data filed at 01/23/2020 1900 Gross per 24 hour  Intake 1680 ml  Output --  Net 1680 ml     Sclerae unicteric  Lungs no rales or wheezes--auscultated anterolaterally  Heart regular rate and rhythm  Abdomen soft, +BS  Neuro nonfocal  Breast exam: deferred  CBG (last 3)  No results for input(s): GLUCAP in the last 72 hours.   Labs:  Lab Results  Component Value Date   WBC 7.2 01/24/2020   HGB 9.0 (L) 01/24/2020   HCT 25.9 (L) 01/24/2020   MCV 93.2 01/24/2020   PLT 188 01/24/2020   NEUTROABS 2.8 01/22/2020    '@LASTCHEMISTRY' @  Urine Studies No results for input(s): UHGB, CRYS in the last 72 hours.  Invalid input(s): UACOL, UAPR, USPG, UPH, UTP, UGL, UKET, UBIL, UNIT, UROB, ULEU, UEPI, UWBC, [place], [place], [place], [place], [place]  Basic Metabolic Panel: Recent Labs  Lab 01/22/20 0840 01/22/20 0840 01/22/20 1400 01/23/20 0531 01/24/20 0500  NA 119*  --   --  120* 118*  K 4.8   < >  --  5.0 4.6  CL 89*  --   --  87* 80*  CO2 16*  --   --  21* 26  GLUCOSE 119*  --   --  101* 93  BUN 74*  --   --  80* 71*  CREATININE 6.02*  --  5.76* 4.36* 4.04*  CALCIUM 8.4*  --    --  8.0* 7.3*  MG  --   --  1.3*  --   --   PHOS  --   --   --  4.6 4.1   < > = values in this interval not displayed.   GFR Estimated Creatinine Clearance: 18.5 mL/min (A) (by C-G formula based on SCr of 4.04 mg/dL (H)). Liver Function Tests: Recent Labs  Lab 01/22/20 0840 01/23/20 0531 01/24/20 0500  AST 35 43*  --   ALT 34 33  --   ALKPHOS 62 49  --   BILITOT 0.2* 0.3  --   PROT 7.2 6.4*  --   ALBUMIN 2.7* 2.7* 2.6*   No results for input(s): LIPASE, AMYLASE in the last 168 hours. No results for input(s): AMMONIA in the last 168 hours. Coagulation profile No results for input(s): INR, PROTIME in the last 168 hours.  CBC: Recent Labs  Lab 01/22/20 0840 01/22/20 1400 01/23/20 0531 01/24/20 0500  WBC 6.3 5.9 6.5 7.2  NEUTROABS 2.8  --   --   --   HGB 8.4* 8.3* 7.8* 9.0*  HCT 23.8* 25.1* 22.3* 25.9*  MCV 92.2 96.2 94.5 93.2  PLT 210 203 208 188   Cardiac Enzymes: No results for input(s): CKTOTAL, CKMB, CKMBINDEX, TROPONINI in the last 168 hours. BNP: Invalid input(s): POCBNP CBG: No results for input(s): GLUCAP in the last 168 hours. D-Dimer No results for input(s): DDIMER in the last 72 hours. Hgb A1c No results for input(s): HGBA1C in the last 72 hours. Lipid Profile No results for input(s): CHOL, HDL, LDLCALC, TRIG, CHOLHDL, LDLDIRECT in the last 72 hours. Thyroid function studies Recent Labs    01/22/20 1641  TSH 1.802   Anemia work up Recent Labs    01/22/20 1641  TIBC 231*  IRON 111   Microbiology Recent Results (from the past 240 hour(s))  Culture, blood (Routine X 2) w Reflex to ID Panel     Status: None (Preliminary result)   Collection Time: 01/22/20 12:09 PM   Specimen: BLOOD  Result Value Ref Range Status   Specimen Description   Final    BLOOD PORTA CATH Performed at [facility], [address], [place], [place] [address]    Special Requests   Final    BOTTLES DRAWN AEROBIC AND ANAEROBIC Blood Culture adequate  volume Performed at [facility] [address], [place], [place] [address]    Culture  Setup Time   Final    GRAM POSITIVE COCCI IN PAIRS IN CHAINS IN BOTH AEROBIC AND ANAEROBIC BOTTLES Organism ID to follow CRITICAL RESULT CALLED TO, READ BACK BY AND VERIFIED WITH: Karie Kirks PharmD 9:00 01/23/20 ([email or web address]) Performed at [facility], [place] [address] [place], [place] [address]    Culture GRAM POSITIVE COCCI  Final   Report Status PENDING  Incomplete  Blood Culture ID Panel (Reflexed)     Status: Abnormal   Collection Time: 01/22/20 12:09 PM  Result Value Ref Range Status   Enterococcus species DETECTED (A) NOT DETECTED Final    Comment: CRITICAL RESULT CALLED TO, READ BACK BY AND VERIFIED WITH: Karie Kirks PharmD 9:00 01/23/20 ([email or web address])    Vancomycin resistance NOT DETECTED NOT DETECTED Final   Listeria monocytogenes NOT DETECTED NOT DETECTED Final   Staphylococcus species NOT DETECTED NOT DETECTED Final   Staphylococcus aureus (BCID) NOT DETECTED NOT DETECTED Final   Streptococcus species NOT DETECTED NOT DETECTED Final   Streptococcus agalactiae NOT DETECTED NOT DETECTED Final   Streptococcus pneumoniae NOT DETECTED NOT DETECTED Final   Streptococcus pyogenes NOT DETECTED NOT DETECTED Final   Acinetobacter baumannii NOT DETECTED NOT DETECTED Final   Enterobacteriaceae species NOT DETECTED NOT DETECTED Final   Enterobacter cloacae complex NOT DETECTED NOT DETECTED Final   Escherichia coli NOT DETECTED NOT DETECTED Final   Klebsiella oxytoca NOT DETECTED NOT DETECTED Final   Klebsiella pneumoniae NOT DETECTED NOT DETECTED Final   Proteus species NOT DETECTED NOT DETECTED Final   Serratia marcescens NOT DETECTED NOT DETECTED Final   Haemophilus influenzae NOT DETECTED NOT DETECTED Final   Neisseria meningitidis NOT DETECTED NOT DETECTED Final   Pseudomonas aeruginosa NOT DETECTED NOT DETECTED Final   Candida albicans NOT DETECTED NOT DETECTED Final   Candida  glabrata NOT DETECTED NOT DETECTED Final   Candida krusei NOT DETECTED NOT DETECTED Final   Candida parapsilosis NOT DETECTED NOT DETECTED Final   Candida tropicalis NOT DETECTED NOT DETECTED Final    Comment: Performed at [facility], [address], [place], [place] [address]  SARS Coronavirus 2 by RT PCR (hospital order, performed in [facility] hospital lab) Nasopharyngeal Nasopharyngeal Swab  Status: None   Collection Time: 07-Feb-2020 12:10 PM   Specimen: Nasopharyngeal Swab  Result Value Ref Range Status   SARS Coronavirus 2 NEGATIVE NEGATIVE Final    Comment: (NOTE) SARS-CoV-2 target nucleic acids are NOT DETECTED. The SARS-CoV-2 RNA is generally detectable in upper and lower respiratory specimens during the acute phase of infection. The lowest concentration of SARS-CoV-2 viral copies this assay can detect is 250 copies / mL. A negative result does not preclude SARS-CoV-2 infection and should not be used as the sole basis for treatment or other patient management decisions.  A negative result may occur with improper specimen collection / handling, submission of specimen other than nasopharyngeal swab, presence of viral mutation(s) within the areas targeted by this assay, and inadequate number of viral copies (<250 copies / mL). A negative result must be combined with clinical observations, patient history, and epidemiological information. Fact Sheet for Patients:   StrictlyIdeas.no Fact Sheet for Healthcare Providers: BankingDealers.co.za This test is not yet approved or cleared  by the [place] FDA and has been authorized for detection and/or diagnosis of SARS-CoV-2 by FDA under an Emergency Use Authorization (EUA).  This EUA will remain in effect (meaning this test can be used) for the duration of the COVID-19 declaration under Section 564(b)(1) of the Act, 21 U.S.C. section 360bbb-3(b)(1), unless the authorization is  terminated or revoked sooner. Performed at [facility], [place] [address], [place], [place] [address]   Culture, blood (routine x 2)     Status: None (Preliminary result)   Collection Time: 01/23/20 10:25 AM   Specimen: BLOOD  Result Value Ref Range Status   Specimen Description   Final    BLOOD RIGHT ARM Performed at [facility] [address] [place], [place] [address]    Special Requests   Final    BOTTLES DRAWN AEROBIC AND ANAEROBIC Blood Culture adequate volume Performed at [facility] [address] [place], [place] [address]    Culture   Final    NO GROWTH < 12 HOURS Performed at [facility] [address] [place], [place] [address]    Report Status PENDING  Incomplete  Culture, blood (routine x 2)     Status: None (Preliminary result)   Collection Time: 01/23/20 10:35 AM   Specimen: BLOOD RIGHT HAND  Result Value Ref Range Status   Specimen Description   Final    BLOOD RIGHT HAND Performed at [facility] [address], [place], [place] [address]    Special Requests   Final    BOTTLES DRAWN AEROBIC AND ANAEROBIC Blood Culture adequate volume Performed at [facility] [address] [place], [place] [address]    Culture   Final    NO GROWTH < 12 HOURS Performed at [facility] [address], [place], [place] [address]    Report Status PENDING  Incomplete      Studies:  US RENAL  Result Date: 2020/02/07 CLINICAL DATA:  Acute renal failure. EXAM: RENAL / URINARY TRACT ULTRASOUND COMPLETE COMPARISON:  None. FINDINGS: Right Kidney: Renal measurements: 12.6 x 5.4 x 6.4 cm = volume: 224 mL . Echogenicity within normal limits. No mass or hydronephrosis visualized. Left Kidney: Renal measurements: 11.2 x 6.0 x 5.7 cm = volume: 202 mL. Echogenicity within normal limits. No mass or hydronephrosis visualized. Bladder: Appears normal for degree of bladder  distention. Other: None. IMPRESSION: Normal renal ultrasound. Electronically Signed  By: Lajean Manes M.D.   On: 01/22/2020 14:46   ECHOCARDIOGRAM LIMITED  Result Date: 01/22/2020    ECHOCARDIOGRAM LIMITED REPORT   Patient Name:   SHANAIA SIEVERS Date of Exam: 01/22/2020 Medical Rec #:  [id number]     Height:       61.0 in Accession #:    [id number]    Weight:       303.9 lb Date of Birth:  08-05-1956    BSA:          2.256 m Patient Age:    43 years      BP:           92/60 mmHg Patient Gender: F             HR:           73 bpm. Exam Location:  Inpatient Procedure: Limited Echo Indications:    Pericardial effusion  History:        Patient has prior history of Echocardiogram examinations, most                 recent 11/26/2019. Risk Factors:Hypertension. Breast cancer, s/p                 mastectomy.  Sonographer:    Dustin Flock Referring Phys: [place]  1. Left ventricular ejection fraction, by estimation, is 65 to 70%. The left ventricle has normal function. The left ventricle has no regional wall motion abnormalities. There is mild left ventricular hypertrophy.  2. Right ventricular systolic function is normal. The right ventricular size is normal.  3. The inferior vena cava is normal in size with greater than 50% respiratory variability, suggesting right atrial pressure of 3 mmHg.  4. No pericardial effusion.  5. Limited echo with no doppler. FINDINGS  Left Ventricle: Left ventricular ejection fraction, by estimation, is 65 to 70%. The left ventricle has normal function. The left ventricle has no regional wall motion abnormalities. The left ventricular internal cavity size was normal in size. There is  mild left ventricular hypertrophy. Right Ventricle: The right ventricular size is normal. No increase in right ventricular wall thickness. Right ventricular systolic function is normal. Left Atrium: Left atrial size was normal in size. Right Atrium: Right atrial size was normal in  size. Pericardium: There is no evidence of pericardial effusion. Venous: The inferior vena cava is normal in size with greater than 50% respiratory variability, suggesting right atrial pressure of 3 mmHg. IAS/Shunts: No atrial level shunt detected by color flow Doppler.  LEFT VENTRICLE PLAX 2D LVIDd:         3.30 cm LVIDs:         1.80 cm LV PW:         1.50 cm LV IVS:        1.30 cm  Loralie Champagne MD Electronically signed by Loralie Champagne MD Signature Date/Time: 01/22/2020/3:19:05 PM    Final     Assessment: 64 y.o. [place] woman status post left breast upper outer quadrant and left axillary lymph node biopsies 11/12/2019 for a clinical T2 N2, stage IIIA invasive ductal carcinoma, grade 3, estrogen and progesterone receptor positive, HER-2 not amplified, with an MIB-1 of 70%             (a) the biopsied axillary lymph node was positive  (1) genetics testing 11/29/2019 through the Breast Cancer STAT Panel offered by [facility] found no deleterious mutations in ATM, BRCA1, BRCA2, CDH1, CHEK2, PALB2, PTEN, STK11 and  TP53.  Additional testing through the Common Hereditary Cancers Panel confirmed no deleterious mutations in APC, ATM, AXIN2, BARD1, BMPR1A, BRCA1, BRCA2, BRIP1, CDH1, CDK4, CDKN2A (p14ARF), CDKN2A (p16INK4a), CHEK2, CTNNA1, DICER1, EPCAM (Deletion/duplication testing only), GREM1 (promoter region deletion/duplication testing only), KIT, MEN1, MLH1, MSH2, MSH3, MSH6, MUTYH, NBN, NF1, NHTL1, PALB2, PDGFRA, PMS2, POLD1, POLE, PTEN, RAD50, RAD51C, RAD51D, RNF43, SDHB, SDHC, SDHD, SMAD4, SMARCA4. STK11, TP53, TSC1, TSC2, and VHL.  The following genes were evaluated for sequence changes only: SDHA and HOXB13 c.251G>A variant only.  (2) status post left modified radical mastectomy 12/19/2019 for a pT2 pN1, stage IIB invasive ductal carcinoma, grade 3, with negative margins.             (a) 1 of 13 axillary lymph nodes removed was positive  (3) adjuvant chemotherapy will consist of cyclophosphamide  and doxorubicin in dose dense fashion x4 followed by weekly paclitaxel x12             (a) baseline echocardiogram 11/26/2019 shows an ejection fraction in the 65-70% range.             (b) first cycle of cyclophosphamide and doxorubicin to be dose reduced to assess tolerance  (4) adjuvant radiation as appropriate  (5) antiestrogens to follow  Plan:  Deetta never started her chemotherapy as she was found to be hypotensive on first day of treatment, leading to this admission. She is being treated for enterococcal sepsis and iatrogenic CKD. She is improving clinically.  He mother died 5 days ago after a rapid decline; funeral is this coming Saturday.  I will plan to see Faelynn late next week. We can change the order of her chemo to start with taxol which is well tolerated. The goal of treatment in her case is cure.  I greatly appreciate your help to this patient and her family. Will follow with you.  Chauncey Cruel, MD 01/24/2020  8:09 AM Medical Oncology and Hematology [facility] [address] [place], [place] [address] Tel. [phone]    Fax. [phone]

## 2020-01-24 NOTE — Progress Notes (Signed)
[facility] ROUNDING NOTE   Subjective:   64 year old lady history of left breast cancer status post mastectomy April 2021.  Baseline serum creatinine appears fairly variable but has ranged anywhere from 1.26 mg/dL to about 2.4 mg/dL.  She is due to start chemotherapy in the morning of Jan 22, 2020 routine labs showed a serum creatinine of 6 mg/dL the blood pressure 80 mmHg.  She was taking ACE inhibitor B[place] nasal pill, the diuretic Lasix and hydrochlorothiazide and also combination nonsteroidal inflammatories and Cox 2 inhibitors.  Blood pressure 94/50 pulse 96 temperature 99.1  2D echo no pericardial effusion  Lopressor 50 mg twice daily, sodium bicarbonate  100 cc an hour  No recorded urine output but 1.6 L input last 24 hours  IV Omnipen 2 g every 8 hours  Sodium 118 potassium 4.6 chloride 80 CO2 26 BUN 71 creatinine 4.04 glucose 93 albumin 2.6 hemoglobin 9.0  Objective:  Vital signs in last 24 hours:  Temp:  [98.7 F (37.1 C)-99.2 F (37.3 C)] 99.1 F (37.3 C) (05/27 0545) Pulse Rate:  [93-98] 96 (05/27 0545) Resp:  [16-20] 18 (05/27 0545) BP: (92-110)/(41-51) 94/50 (05/27 0545) SpO2:  [98 %-99 %] 98 % (05/26 2152)  Weight change:  Filed Weights   01/22/20 2327  Weight: (!) 137 kg    Intake/Output: I/O last 3 completed shifts: In: 1680 [P.O.:480; I.V.:1200] Out: -    Intake/Output this shift:  No intake/output data recorded.  General: Calm nondistressed HEENT: None traumatic external appearance normal oropharynx clear Eyes: Pupils were round equal reactive Neck: Supple no masses or thyromegaly Heart: Regular rate and rhythm no murmurs rubs or gallops chronic bilateral leg edema with 2+ pedal pulses. Lungs: Clear to auscultation bilaterally no wheezes rales or crackles Abdomen: Soft nontender bowel sounds are present Extremities: Chronic lower extremity lymphedema Skin: No skin rashes no itching Neuro: Cranial nerves intact sensation strength  regular 5/5   Basic Metabolic Panel: Recent Labs  Lab 01/22/20 0840 01/22/20 1400 01/23/20 0531 01/24/20 0500  NA 119*  --  120* 118*  K 4.8  --  5.0 4.6  CL 89*  --  87* 80*  CO2 16*  --  21* 26  GLUCOSE 119*  --  101* 93  BUN 74*  --  80* 71*  CREATININE 6.02* 5.76* 4.36* 4.04*  CALCIUM 8.4*  --  8.0* 7.3*  MG  --  1.3*  --   --   PHOS  --   --  4.6 4.1    Liver Function Tests: Recent Labs  Lab 01/22/20 0840 01/23/20 0531 01/24/20 0500  AST 35 43*  --   ALT 34 33  --   ALKPHOS 62 49  --   BILITOT 0.2* 0.3  --   PROT 7.2 6.4*  --   ALBUMIN 2.7* 2.7* 2.6*   No results for input(s): LIPASE, AMYLASE in the last 168 hours. No results for input(s): AMMONIA in the last 168 hours.  CBC: Recent Labs  Lab 01/22/20 0840 01/22/20 1400 01/23/20 0531 01/24/20 0500  WBC 6.3 5.9 6.5 7.2  NEUTROABS 2.8  --   --   --   HGB 8.4* 8.3* 7.8* 9.0*  HCT 23.8* 25.1* 22.3* 25.9*  MCV 92.2 96.2 94.5 93.2  PLT 210 203 208 188    Cardiac Enzymes: No results for input(s): CKTOTAL, CKMB, CKMBINDEX, TROPONINI in the last 168 hours.  BNP: Invalid input(s): POCBNP  CBG: No results for input(s): GLUCAP in the last 168 hours.  Microbiology: Results for orders placed or performed during the hospital encounter of 01/22/20  Culture, blood (Routine X 2) w Reflex to ID Panel     Status: None (Preliminary result)   Collection Time: 01/22/20 12:09 PM   Specimen: BLOOD  Result Value Ref Range Status   Specimen Description   Final    BLOOD PORTA CATH Performed at [facility], [place] [address], [place], [place] [address]    Special Requests   Final    BOTTLES DRAWN AEROBIC AND ANAEROBIC Blood Culture adequate volume Performed at [place] [address], [place], [place] [address]    Culture  Setup Time   Final    GRAM POSITIVE COCCI IN PAIRS IN CHAINS IN BOTH AEROBIC AND ANAEROBIC BOTTLES Organism ID to follow CRITICAL RESULT CALLED TO, READ BACK BY  AND VERIFIED WITH: Karie Kirks PharmD 9:00 01/23/20 ([email or web address]) Performed at [facility], [place] [address] [place], [place] [address]    Culture GRAM POSITIVE COCCI  Final   Report Status PENDING  Incomplete  Blood Culture ID Panel (Reflexed)     Status: Abnormal   Collection Time: 01/22/20 12:09 PM  Result Value Ref Range Status   Enterococcus species DETECTED (A) NOT DETECTED Final    Comment: CRITICAL RESULT CALLED TO, READ BACK BY AND VERIFIED WITH: Karie Kirks PharmD 9:00 01/23/20 ([email or web address])    Vancomycin resistance NOT DETECTED NOT DETECTED Final   Listeria monocytogenes NOT DETECTED NOT DETECTED Final   Staphylococcus species NOT DETECTED NOT DETECTED Final   Staphylococcus aureus (BCID) NOT DETECTED NOT DETECTED Final   Streptococcus species NOT DETECTED NOT DETECTED Final   Streptococcus agalactiae NOT DETECTED NOT DETECTED Final   Streptococcus pneumoniae NOT DETECTED NOT DETECTED Final   Streptococcus pyogenes NOT DETECTED NOT DETECTED Final   Acinetobacter baumannii NOT DETECTED NOT DETECTED Final   Enterobacteriaceae species NOT DETECTED NOT DETECTED Final   Enterobacter cloacae complex NOT DETECTED NOT DETECTED Final   Escherichia coli NOT DETECTED NOT DETECTED Final   Klebsiella oxytoca NOT DETECTED NOT DETECTED Final   Klebsiella pneumoniae NOT DETECTED NOT DETECTED Final   Proteus species NOT DETECTED NOT DETECTED Final   Serratia marcescens NOT DETECTED NOT DETECTED Final   Haemophilus influenzae NOT DETECTED NOT DETECTED Final   Neisseria meningitidis NOT DETECTED NOT DETECTED Final   Pseudomonas aeruginosa NOT DETECTED NOT DETECTED Final   Candida albicans NOT DETECTED NOT DETECTED Final   Candida glabrata NOT DETECTED NOT DETECTED Final   Candida krusei NOT DETECTED NOT DETECTED Final   Candida parapsilosis NOT DETECTED NOT DETECTED Final   Candida tropicalis NOT DETECTED NOT DETECTED Final    Comment: Performed at [facility], [address]  [place],  [address]  SARS Coronavirus 2 by RT PCR (hospital order, performed in [facility] hospital lab) Nasopharyngeal Nasopharyngeal Swab     Status: None   Collection Time: 01/22/20 12:10 PM   Specimen: Nasopharyngeal Swab  Result Value Ref Range Status   SARS Coronavirus 2 NEGATIVE NEGATIVE Final    Comment: (NOTE) SARS-CoV-2 target nucleic acids are NOT DETECTED. The SARS-CoV-2 RNA is generally detectable in upper and lower respiratory specimens during the acute phase of infection. The lowest concentration of SARS-CoV-2 viral copies this assay can detect is 250 copies / mL. A negative result does not preclude SARS-CoV-2 infection and should not be used as the sole basis for treatment or other patient management decisions.  A negative result may  occur with improper specimen collection / handling, submission of specimen other than nasopharyngeal swab, presence of viral mutation(s) within the areas targeted by this assay, and inadequate number of viral copies (<250 copies / mL). A negative result must be combined with clinical observations, patient history, and epidemiological information. Fact Sheet for Patients:   StrictlyIdeas.no Fact Sheet for Healthcare Providers: BankingDealers.co.za This test is not yet approved or cleared  by the [place] FDA and has been authorized for detection and/or diagnosis of SARS-CoV-2 by FDA under an Emergency Use Authorization (EUA).  This EUA will remain in effect (meaning this test can be used) for the duration of the COVID-19 declaration under Section 564(b)(1) of the Act, 21 U.S.C. section 360bbb-3(b)(1), unless the authorization is terminated or revoked sooner. Performed at [facility], [place] [address], [place], [place] [address]   Culture, blood (routine x 2)     Status: None (Preliminary result)   Collection Time: 01/23/20 10:25 AM   Specimen: BLOOD  Result Value Ref  Range Status   Specimen Description   Final    BLOOD RIGHT ARM Performed at [place] [address] [place], [place] [address]    Special Requests   Final    BOTTLES DRAWN AEROBIC AND ANAEROBIC Blood Culture adequate volume Performed at [place] [address] [place], [place] [address]    Culture   Final    NO GROWTH < 12 HOURS Performed at [place] [address], [place], [place] [address]    Report Status PENDING  Incomplete  Culture, blood (routine x 2)     Status: None (Preliminary result)   Collection Time: 01/23/20 10:35 AM   Specimen: BLOOD RIGHT HAND  Result Value Ref Range Status   Specimen Description   Final    BLOOD RIGHT HAND Performed at [facility] [place] [address], [place], [place] [address]    Special Requests   Final    BOTTLES DRAWN AEROBIC AND ANAEROBIC Blood Culture adequate volume Performed at [place] [address], [place], [place] [address]    Culture   Final    NO GROWTH < 12 HOURS Performed at [facility] [address] [place], [place] [address]    Report Status PENDING  Incomplete    Coagulation Studies: No results for input(s): LABPROT, INR in the last 72 hours.  Urinalysis: Recent Labs    01/22/20 1601  COLORURINE YELLOW  LABSPEC 1.009  PHURINE 5.0  GLUCOSEU NEGATIVE  HGBUR NEGATIVE  BILIRUBINUR NEGATIVE  KETONESUR NEGATIVE  PROTEINUR NEGATIVE  NITRITE NEGATIVE  LEUKOCYTESUR SMALL*      Imaging: US RENAL  Result Date: 01/22/2020 CLINICAL DATA:  Acute renal failure. EXAM: RENAL / URINARY TRACT ULTRASOUND COMPLETE COMPARISON:  None. FINDINGS: Right Kidney: Renal measurements: 12.6 x 5.4 x 6.4 cm = volume: 224 mL . Echogenicity within normal limits. No mass or hydronephrosis visualized. Left Kidney: Renal measurements: 11.2 x 6.0 x 5.7 cm = volume: 202 mL. Echogenicity within normal limits. No mass or hydronephrosis  visualized. Bladder: Appears normal for degree of bladder distention. Other: None. IMPRESSION: Normal renal ultrasound. Electronically Signed   By: Lajean Manes M.D.   On: 01/22/2020 14:46   ECHOCARDIOGRAM LIMITED  Result Date: 01/22/2020    ECHOCARDIOGRAM LIMITED REPORT   Patient Name:   [name] Date of Exam: 01/22/2020 Medical Rec #:  [id number]     Height:  61.0 in Accession #:    [id number]    Weight:       303.9 lb Date of Birth:  01/20/1956    BSA:          2.256 m Patient Age:    74 years      BP:           92/60 mmHg Patient Gender: F             HR:           73 bpm. Exam Location:  Inpatient Procedure: Limited Echo Indications:    Pericardial effusion  History:        Patient has prior history of Echocardiogram examinations, most                 recent 11/26/2019. Risk Factors:Hypertension. Breast cancer, s/p                 mastectomy.  Sonographer:    Dustin Flock Referring Phys: [place]  1. Left ventricular ejection fraction, by estimation, is 65 to 70%. The left ventricle has normal function. The left ventricle has no regional wall motion abnormalities. There is mild left ventricular hypertrophy.  2. Right ventricular systolic function is normal. The right ventricular size is normal.  3. The inferior vena cava is normal in size with greater than 50% respiratory variability, suggesting right atrial pressure of 3 mmHg.  4. No pericardial effusion.  5. Limited echo with no doppler. FINDINGS  Left Ventricle: Left ventricular ejection fraction, by estimation, is 65 to 70%. The left ventricle has normal function. The left ventricle has no regional wall motion abnormalities. The left ventricular internal cavity size was normal in size. There is  mild left ventricular hypertrophy. Right Ventricle: The right ventricular size is normal. No increase in right ventricular wall thickness. Right ventricular systolic function is normal. Left Atrium: Left atrial size was normal  in size. Right Atrium: Right atrial size was normal in size. Pericardium: There is no evidence of pericardial effusion. Venous: The inferior vena cava is normal in size with greater than 50% respiratory variability, suggesting right atrial pressure of 3 mmHg. IAS/Shunts: No atrial level shunt detected by color flow Doppler.  LEFT VENTRICLE PLAX 2D LVIDd:         3.30 cm LVIDs:         1.80 cm LV PW:         1.50 cm LV IVS:        1.30 cm  Loralie Champagne MD Electronically signed by Loralie Champagne MD Signature Date/Time: 01/22/2020/3:19:05 PM    Final      Medications:   . ampicillin (OMNIPEN) IV 2 g (01/24/20 0618)  .  sodium bicarbonate (isotonic) infusion in sterile water 100 mL/hr at 01/24/20 0448   . Chlorhexidine Gluconate Cloth  6 each Topical Daily  . heparin  5,000 Units Subcutaneous Q8H  . metoprolol tartrate  50 mg Oral BID   acetaminophen **OR** acetaminophen, HYDROcodone-acetaminophen, ondansetron **OR** ondansetron (ZOFRAN) IV  Assessment/ Plan:  1.Renal-acute kidney injury this appears to be in the setting of some chronic kidney damage.  Her renal function with baseline does not appear completely normal and she may indeed have stage III chronic kidney disease.  Her baseline creatinine seems to vary considerably.  This obviously is a change with a creatinine that is increased to above 6 mg/dL.  Noted that she is hypotensive and does take ACE inhibitors, nonsteroidal anti-inflammatory drugs,  Cox 2 inhibitors, diuretics in one form or other.  Urinalysis was bland although did show cysts 6-10 WBCs per high-power field with many bacteria.  Renal ultrasound did not reveal any evidence of hydronephrosis  2D echo does not reveal any pericardial effusion.  EF 65%.  We will check a cortisol 2. Hypertension/volume  -appears to have some volume depletion.  Stop antihypertensive medications and cautiously hydrate.  We will stop beta-blocker metoprolol as pressures appear to be low 3.  Metabolic  acidosis.  This has been corrected we will discontinue IV sodium bicarbonate    4.  Breast cancer-chemotherapy on hold until stabilization of renal function. 5.  Hyponatremia.  Appears to have worsened.  Although her renal function appears to have improved.  She has had some IV fluids.  I will start some Lasix and check a urine osmolality. 6.  Enterococcus positive blood culture appreciate assistance from Dr Linus Salmons.  Patient started on ampicillin 01/23/2020.  Patient does have Port-A-Cath    LOS: [place] @TODAY @8 :23 AM

## 2020-01-25 ENCOUNTER — Encounter (HOSPITAL_BASED_OUTPATIENT_CLINIC_OR_DEPARTMENT_OTHER): Payer: Medicaid Other | Admitting: Internal Medicine

## 2020-01-25 DIAGNOSIS — B952 Enterococcus as the cause of diseases classified elsewhere: Secondary | ICD-10-CM

## 2020-01-25 DIAGNOSIS — Z9012 Acquired absence of left breast and nipple: Secondary | ICD-10-CM

## 2020-01-25 DIAGNOSIS — N189 Chronic kidney disease, unspecified: Secondary | ICD-10-CM

## 2020-01-25 DIAGNOSIS — C50912 Malignant neoplasm of unspecified site of left female breast: Secondary | ICD-10-CM

## 2020-01-25 DIAGNOSIS — N179 Acute kidney failure, unspecified: Secondary | ICD-10-CM

## 2020-01-25 LAB — BASIC METABOLIC PANEL
Anion gap: 11 (ref 5–15)
BUN: 67 mg/dL — ABNORMAL HIGH (ref 8–23)
CO2: 27 mmol/L (ref 22–32)
Calcium: 8.2 mg/dL — ABNORMAL LOW (ref 8.9–10.3)
Chloride: 85 mmol/L — ABNORMAL LOW (ref 98–111)
Creatinine, Ser: 3.14 mg/dL — ABNORMAL HIGH (ref 0.44–1.00)
GFR calc Af Amer: 17 mL/min — ABNORMAL LOW (ref >60)
GFR calc non Af Amer: 15 mL/min — ABNORMAL LOW (ref >60)
Glucose, Bld: 104 mg/dL — ABNORMAL HIGH (ref 70–99)
Potassium: 4 mmol/L (ref 3.5–5.1)
Sodium: 123 mmol/L — ABNORMAL LOW (ref 135–145)

## 2020-01-25 LAB — CULTURE, BLOOD (ROUTINE X 2): Special Requests: ADEQUATE

## 2020-01-25 LAB — RENAL FUNCTION PANEL
Albumin: 2.5 g/dL — ABNORMAL LOW (ref 3.5–5.0)
Anion gap: 14 (ref 5–15)
BUN: 65 mg/dL — ABNORMAL HIGH (ref 8–23)
CO2: 26 mmol/L (ref 22–32)
Calcium: 8.1 mg/dL — ABNORMAL LOW (ref 8.9–10.3)
Chloride: 85 mmol/L — ABNORMAL LOW (ref 98–111)
Creatinine, Ser: 3.2 mg/dL — ABNORMAL HIGH (ref 0.44–1.00)
GFR calc Af Amer: 17 mL/min — ABNORMAL LOW (ref >60)
GFR calc non Af Amer: 15 mL/min — ABNORMAL LOW (ref >60)
Glucose, Bld: 104 mg/dL — ABNORMAL HIGH (ref 70–99)
Phosphorus: 4.4 mg/dL (ref 2.5–4.6)
Potassium: 4.2 mmol/L (ref 3.5–5.1)
Sodium: 125 mmol/L — ABNORMAL LOW (ref 135–145)

## 2020-01-25 LAB — CBC
HCT: 21.8 % — ABNORMAL LOW (ref 36.0–46.0)
Hemoglobin: 7.6 g/dL — ABNORMAL LOW (ref 12.0–15.0)
MCH: 33 pg (ref 26.0–34.0)
MCHC: 34.9 g/dL (ref 30.0–36.0)
MCV: 94.8 fL (ref 80.0–100.0)
Platelets: 213 10*3/uL (ref 150–400)
RBC: 2.3 MIL/uL — ABNORMAL LOW (ref 3.87–5.11)
RDW: 12.5 % (ref 11.5–15.5)
WBC: 7.6 10*3/uL (ref 4.0–10.5)
nRBC: 0 % (ref 0.0–0.2)

## 2020-01-25 LAB — CORTISOL: Cortisol, Plasma: 11.8 ug/dL

## 2020-01-25 NOTE — Progress Notes (Signed)
[facility] for Infectious Disease   Reason for visit: Follow up on Enteroccocal positive blood culture  Interval History: repeat blood cultures remain negative, remains afebrile, no leukocytosis.  Feels well.  No issues with port a cath.    Physical Exam: Constitutional:  Vitals:   01/24/20 2150 01/25/20 0527  BP: (!) 98/57 (!) 100/54  Pulse: 95 94  Resp: 18 18  Temp: 98.1 F (36.7 C) 97.9 F (36.6 C)  SpO2: 100% 99%   patient appears in NAD Respiratory: Normal respiratory effort; CTA B Cardiovascular: RRR GI: soft, nt, nd  Review of Systems: Constitutional: negative for fevers and chills Gastrointestinal: negative for nausea and diarrhea  Lab Results  Component Value Date   WBC 7.6 01/25/2020   HGB 7.6 (L) 01/25/2020   HCT 21.8 (L) 01/25/2020   MCV 94.8 01/25/2020   PLT 213 01/25/2020    Lab Results  Component Value Date   CREATININE 3.20 (H) 01/25/2020   CREATININE 3.14 (H) 01/25/2020   BUN 65 (H) 01/25/2020   BUN 67 (H) 01/25/2020   NA 125 (L) 01/25/2020   NA 123 (L) 01/25/2020   K 4.2 01/25/2020   K 4.0 01/25/2020   CL 85 (L) 01/25/2020   CL 85 (L) 01/25/2020   CO2 26 01/25/2020   CO2 27 01/25/2020    Lab Results  Component Value Date   ALT 33 01/23/2020   AST 43 (H) 01/23/2020   ALKPHOS 49 01/23/2020     Microbiology: Recent Results (from the past 240 hour(s))  Culture, blood (Routine X 2) w Reflex to ID Panel     Status: Abnormal   Collection Time: 01/22/20 12:09 PM   Specimen: BLOOD  Result Value Ref Range Status   Specimen Description   Final    BLOOD PORTA CATH Performed at [facility], [address], [place], [place] [address]    Special Requests   Final    BOTTLES DRAWN AEROBIC AND ANAEROBIC Blood Culture adequate volume Performed at [place] [address] [place], [place] [address]    Culture  Setup Time   Final    GRAM POSITIVE COCCI IN PAIRS IN CHAINS IN BOTH AEROBIC AND ANAEROBIC  BOTTLES Organism ID to follow CRITICAL RESULT CALLED TO, READ BACK BY AND VERIFIED WITH: Karie Kirks PharmD 9:00 01/23/20 ([email or web address]) Performed at [facility], [place] [address], [place], [place] [address]    Culture ENTEROCOCCUS GALLINARUM (A)  Final   Report Status 01/25/2020 FINAL  Final   Organism ID, Bacteria ENTEROCOCCUS GALLINARUM  Final      Susceptibility   Enterococcus gallinarum - MIC*    AMPICILLIN <=2 SENSITIVE Sensitive     VANCOMYCIN RESISTANT Resistant     GENTAMICIN SYNERGY SENSITIVE Sensitive     LINEZOLID 1 SENSITIVE Sensitive     * ENTEROCOCCUS GALLINARUM  Blood Culture ID Panel (Reflexed)     Status: Abnormal   Collection Time: 01/22/20 12:09 PM  Result Value Ref Range Status   Enterococcus species DETECTED (A) NOT DETECTED Final    Comment: CRITICAL RESULT CALLED TO, READ BACK BY AND VERIFIED WITH: Karie Kirks PharmD 9:00 01/23/20 ([email or web address])    Vancomycin resistance NOT DETECTED NOT DETECTED Final   Listeria monocytogenes NOT DETECTED NOT DETECTED Final   Staphylococcus species NOT DETECTED NOT DETECTED Final   Staphylococcus aureus (BCID) NOT DETECTED NOT DETECTED Final   Streptococcus species NOT DETECTED NOT DETECTED Final   Streptococcus agalactiae NOT DETECTED  NOT DETECTED Final   Streptococcus pneumoniae NOT DETECTED NOT DETECTED Final   Streptococcus pyogenes NOT DETECTED NOT DETECTED Final   Acinetobacter baumannii NOT DETECTED NOT DETECTED Final   Enterobacteriaceae species NOT DETECTED NOT DETECTED Final   Enterobacter cloacae complex NOT DETECTED NOT DETECTED Final   Escherichia coli NOT DETECTED NOT DETECTED Final   Klebsiella oxytoca NOT DETECTED NOT DETECTED Final   Klebsiella pneumoniae NOT DETECTED NOT DETECTED Final   Proteus species NOT DETECTED NOT DETECTED Final   Serratia marcescens NOT DETECTED NOT DETECTED Final   Haemophilus influenzae NOT DETECTED NOT DETECTED Final   Neisseria meningitidis NOT DETECTED NOT DETECTED Final   Pseudomonas  aeruginosa NOT DETECTED NOT DETECTED Final   Candida albicans NOT DETECTED NOT DETECTED Final   Candida glabrata NOT DETECTED NOT DETECTED Final   Candida krusei NOT DETECTED NOT DETECTED Final   Candida parapsilosis NOT DETECTED NOT DETECTED Final   Candida tropicalis NOT DETECTED NOT DETECTED Final    Comment: Performed at [facility], [place] [address] [place], [place] [address]  SARS Coronavirus 2 by RT PCR (hospital order, performed in [facility] hospital lab) Nasopharyngeal Nasopharyngeal Swab     Status: None   Collection Time: 01/22/20 12:10 PM   Specimen: Nasopharyngeal Swab  Result Value Ref Range Status   SARS Coronavirus 2 NEGATIVE NEGATIVE Final    Comment: (NOTE) SARS-CoV-2 target nucleic acids are NOT DETECTED. The SARS-CoV-2 RNA is generally detectable in upper and lower respiratory specimens during the acute phase of infection. The lowest concentration of SARS-CoV-2 viral copies this assay can detect is 250 copies / mL. A negative result does not preclude SARS-CoV-2 infection and should not be used as the sole basis for treatment or other patient management decisions.  A negative result may occur with improper specimen collection / handling, submission of specimen other than nasopharyngeal swab, presence of viral mutation(s) within the areas targeted by this assay, and inadequate number of viral copies (<250 copies / mL). A negative result must be combined with clinical observations, patient history, and epidemiological information. Fact Sheet for Patients:   StrictlyIdeas.no Fact Sheet for Healthcare Providers: BankingDealers.co.za This test is not yet approved or cleared  by the [place] FDA and has been authorized for detection and/or diagnosis of SARS-CoV-2 by FDA under an Emergency Use Authorization (EUA).  This EUA will remain in effect (meaning this test can be used) for the duration of the COVID-19  declaration under Section 564(b)(1) of the Act, 21 U.S.C. section 360bbb-3(b)(1), unless the authorization is terminated or revoked sooner. Performed at [facility], [place] [address], [place], [place] [address]   Culture, blood (routine x 2)     Status: None (Preliminary result)   Collection Time: 01/23/20 10:25 AM   Specimen: BLOOD  Result Value Ref Range Status   Specimen Description   Final    BLOOD RIGHT ARM Performed at [facility] [address] [place], [place] [address]    Special Requests   Final    BOTTLES DRAWN AEROBIC AND ANAEROBIC Blood Culture adequate volume Performed at [facility] [address], [place], [place] [address]    Culture   Final    NO GROWTH 2 DAYS Performed at [facility] [place] [address] [place], [place] [address]    Report Status PENDING  Incomplete  Culture, blood (routine x 2)     Status: None (Preliminary result)   Collection Time: 01/23/20  10:35 AM   Specimen: BLOOD RIGHT HAND  Result Value Ref Range Status   Specimen Description   Final    BLOOD RIGHT HAND Performed at [facility], [place] [address] [place], [place] [address]    Special Requests   Final    BOTTLES DRAWN AEROBIC AND ANAEROBIC Blood Culture adequate volume Performed at [facility] [address], [place], [place] [address]    Culture   Final    NO GROWTH 2 DAYS Performed at [facility] [address], [place], [place] [address]    Report Status PENDING  Incomplete    Impression/Plan:  1. Enterococcal blood culture - repeat blood cultures remain negative.  She has no signs or symptoms of infection including no chills, no fever, no leukocytosis.   At this point, no indication to continue antibiotics, no indication for port-a-cath removal and would watch her off of antibiotics for 24 hours.  If she remains stable and blood cultures remain negative, no further  work up indicated.     2.  Breast cancer - ok from ID standpoint to resume chemotherapy as indicated if the repeated blood cultures remain negative.    Dr. Tommy Medal is available over the weekend if needed but otherwise I will sign off.  thanks

## 2020-01-25 NOTE — Progress Notes (Signed)
Orthopedic Tech Progress Note Patient Details:  [name] October 28, 1955 [id number]  Ortho Devices Type of Ortho Device: Haematologist Ortho Device/Splint Location: left Ortho Device/Splint Interventions: Application   Post Interventions Patient Tolerated: Well Instructions Provided: Care of device   [name] 01/25/2020, 2:33 PM

## 2020-01-25 NOTE — Consult Note (Signed)
[facility] Nurse Consult Note: Reason for Consult: LE wound with lymphedema  Patient self reports she is followed by the wound care center, however she has only been seen x 1 visit last week. She is scheduled for visit today for re-eval and change of her compression wrap Wound type:venous stasis ulceration; appears to be healed Pressure Injury POA: NA Measurement: wound is closed, fragile skin, scarring Wound bed: pale; new re-epithelialization Drainage (amount, consistency, odor)  Periwound: intact with skin changes consistent with venous stasis dx.  Dressing procedure/placement/frequency: Topical care; silicone foam to the newly hired site Unna's boot to be applied to the LLE; bedside nurse aware to contact ortho tech to re-apply.  Discussed POC with patient and bedside nurse.  Re consult if needed, will not follow at this time. Thanks  Alyssabeth Bruster [employer], RN,CWOCN, CNS, [place] [phone])

## 2020-01-25 NOTE — Progress Notes (Signed)
[facility] ROUNDING NOTE   Subjective:   64 year old lady history of left breast cancer status post mastectomy April 2021.  Baseline serum creatinine appears fairly variable but has ranged anywhere from 1.26 mg/dL to about 2.4 mg/dL.  She is due to start chemotherapy in the morning of Jan 22, 2020 routine labs showed a serum creatinine of 6 mg/dL the blood pressure 80 mmHg.  She was taking ACE inhibitor [place] nasal pill, the diuretic Lasix and hydrochlorothiazide and also combination nonsteroidal inflammatories and Cox 2 inhibitors.  Blood pressure 100/54 pulse 94 temperature 97.9  2D echo no pericardial effusion   IV Lasix 80 mg every 12 hours  Urine osmolality 208 urine sodium 19  100 cc recorded 01/25/2020 + fluid balance almost 3 L  IV Omnipen 2 g every 8 hours  Sodium 125 potassium 4.2 chloride 85 CO2 26 glucose 104 BUN 65 creatinine 3.2 calcium 8.1  Objective:  Vital signs in last 24 hours:  Temp:  [97.9 F (36.6 C)-98.5 F (36.9 C)] 97.9 F (36.6 C) (05/28 0527) Pulse Rate:  [83-95] 94 (05/28 0527) Resp:  [17-18] 18 (05/28 0527) BP: (98-101)/(54-57) 100/54 (05/28 0527) SpO2:  [97 %-100 %] 99 % (05/28 0527)  Weight change:  Filed Weights   01/22/20 2327  Weight: (!) 137 kg    Intake/Output: I/O last 3 completed shifts: In: 1369.6 [I.V.:825; IV Piggyback:544.6] Out: -    Intake/Output this shift:  Total I/O In: -  Out: 100 [Urine:100]  General: Calm nondistressed HEENT: None traumatic external appearance normal oropharynx clear Eyes: Pupils were round equal reactive Neck: Supple no masses or thyromegaly Heart: Regular rate and rhythm no murmurs rubs or gallops chronic bilateral leg edema with 2+ pedal pulses. Lungs: Clear to auscultation bilaterally no wheezes rales or crackles Abdomen: Soft nontender bowel sounds are present Extremities: Chronic lower extremity lymphedema Skin: No skin rashes no itching Neuro: Cranial nerves intact  sensation strength regular 5/5   Basic Metabolic Panel: Recent Labs  Lab 01/22/20 0840 01/22/20 1400 01/23/20 0531 01/23/20 0531 01/24/20 0500 01/24/20 0500 01/24/20 1542 01/24/20 1542 01/24/20 1859 01/24/20 2254 01/25/20 0500  NA   < >  --  120*   < > 118*  --  123*  --  122* 124* 125*  123*  K   < >  --  5.0   < > 4.6  --  4.4  --  4.1 4.0 4.2  4.0  CL   < >  --  87*   < > 80*  --  83*  --  83* 85* 85*  85*  CO2   < >  --  21*   < > 26  --  28  --  26 26 26  27   GLUCOSE   < >  --  101*   < > 93  --  100*  --  99 123* 104*  104*  BUN   < >  --  80*   < > 71*  --  74*  --  72* 67* 65*  67*  CREATININE   < > 5.76* 4.36*   < > 4.04*  --  3.61*  --  3.35* 3.34* 3.20*  3.14*  CALCIUM   < >  --  8.0*   < > 7.3*   < > 8.0*   < > 8.0* 7.9* 8.1*  8.2*  MG  --  1.3*  --   --   --   --   --   --   --   --   --  PHOS  --   --  4.6  --  4.1  --   --   --   --   --  4.4   < > = values in this interval not displayed.    Liver Function Tests: Recent Labs  Lab 01/22/20 0840 01/23/20 0531 01/24/20 0500 01/25/20 0500  AST 35 43*  --   --   ALT 34 33  --   --   ALKPHOS 62 49  --   --   BILITOT 0.2* 0.3  --   --   PROT 7.2 6.4*  --   --   ALBUMIN 2.7* 2.7* 2.6* 2.5*   No results for input(s): LIPASE, AMYLASE in the last 168 hours. No results for input(s): AMMONIA in the last 168 hours.  CBC: Recent Labs  Lab 01/22/20 0840 01/22/20 1400 01/23/20 0531 01/24/20 0500 01/25/20 0500  WBC 6.3 5.9 6.5 7.2 7.6  NEUTROABS 2.8  --   --   --   --   HGB 8.4* 8.3* 7.8* 9.0* 7.6*  HCT 23.8* 25.1* 22.3* 25.9* 21.8*  MCV 92.2 96.2 94.5 93.2 94.8  PLT 210 203 208 188 213    Cardiac Enzymes: No results for input(s): CKTOTAL, CKMB, CKMBINDEX, TROPONINI in the last 168 hours.  BNP: Invalid input(s): POCBNP  CBG: No results for input(s): GLUCAP in the last 168 hours.  Microbiology: Results for orders placed or performed during the hospital encounter of 01/22/20  Culture,  blood (Routine X 2) w Reflex to ID Panel     Status: Abnormal   Collection Time: 01/22/20 12:09 PM   Specimen: BLOOD  Result Value Ref Range Status   Specimen Description   Final    BLOOD PORTA CATH Performed at [facility], [place] [address] [place], [place] [address]    Special Requests   Final    BOTTLES DRAWN AEROBIC AND ANAEROBIC Blood Culture adequate volume Performed at [facility] [address], [place], [place] [address]    Culture  Setup Time   Final    GRAM POSITIVE COCCI IN PAIRS IN CHAINS IN BOTH AEROBIC AND ANAEROBIC BOTTLES Organism ID to follow CRITICAL RESULT CALLED TO, READ BACK BY AND VERIFIED WITH: Karie Kirks PharmD 9:00 01/23/20 ([email or web address]) Performed at [facility], [place] [address] [place], [place] [address]    Culture ENTEROCOCCUS GALLINARUM (A)  Final   Report Status 01/25/2020 FINAL  Final   Organism ID, Bacteria ENTEROCOCCUS GALLINARUM  Final      Susceptibility   Enterococcus gallinarum - MIC*    AMPICILLIN <=2 SENSITIVE Sensitive     VANCOMYCIN RESISTANT Resistant     GENTAMICIN SYNERGY SENSITIVE Sensitive     LINEZOLID 1 SENSITIVE Sensitive     * ENTEROCOCCUS GALLINARUM  Blood Culture ID Panel (Reflexed)     Status: Abnormal   Collection Time: 01/22/20 12:09 PM  Result Value Ref Range Status   Enterococcus species DETECTED (A) NOT DETECTED Final    Comment: CRITICAL RESULT CALLED TO, READ BACK BY AND VERIFIED WITH: Karie Kirks PharmD 9:00 01/23/20 ([email or web address])    Vancomycin resistance NOT DETECTED NOT DETECTED Final   Listeria monocytogenes NOT DETECTED NOT DETECTED Final   Staphylococcus species NOT DETECTED NOT DETECTED Final   Staphylococcus aureus (BCID) NOT DETECTED NOT DETECTED Final   Streptococcus species NOT DETECTED NOT DETECTED Final   Streptococcus agalactiae NOT DETECTED NOT DETECTED Final   Streptococcus pneumoniae NOT DETECTED NOT DETECTED Final  Streptococcus pyogenes NOT DETECTED NOT DETECTED Final    Acinetobacter baumannii NOT DETECTED NOT DETECTED Final   Enterobacteriaceae species NOT DETECTED NOT DETECTED Final   Enterobacter cloacae complex NOT DETECTED NOT DETECTED Final   Escherichia coli NOT DETECTED NOT DETECTED Final   Klebsiella oxytoca NOT DETECTED NOT DETECTED Final   Klebsiella pneumoniae NOT DETECTED NOT DETECTED Final   Proteus species NOT DETECTED NOT DETECTED Final   Serratia marcescens NOT DETECTED NOT DETECTED Final   Haemophilus influenzae NOT DETECTED NOT DETECTED Final   Neisseria meningitidis NOT DETECTED NOT DETECTED Final   Pseudomonas aeruginosa NOT DETECTED NOT DETECTED Final   Candida albicans NOT DETECTED NOT DETECTED Final   Candida glabrata NOT DETECTED NOT DETECTED Final   Candida krusei NOT DETECTED NOT DETECTED Final   Candida parapsilosis NOT DETECTED NOT DETECTED Final   Candida tropicalis NOT DETECTED NOT DETECTED Final    Comment: Performed at [facility], [place] [address], [place], [place] [address]  SARS Coronavirus 2 by RT PCR (hospital order, performed in [facility] hospital lab) Nasopharyngeal Nasopharyngeal Swab     Status: None   Collection Time: 01/22/20 12:10 PM   Specimen: Nasopharyngeal Swab  Result Value Ref Range Status   SARS Coronavirus 2 NEGATIVE NEGATIVE Final    Comment: (NOTE) SARS-CoV-2 target nucleic acids are NOT DETECTED. The SARS-CoV-2 RNA is generally detectable in upper and lower respiratory specimens during the acute phase of infection. The lowest concentration of SARS-CoV-2 viral copies this assay can detect is 250 copies / mL. A negative result does not preclude SARS-CoV-2 infection and should not be used as the sole basis for treatment or other patient management decisions.  A negative result may occur with improper specimen collection / handling, submission of specimen other than nasopharyngeal swab, presence of viral mutation(s) within the areas targeted by this assay, and inadequate number of viral  copies (<250 copies / mL). A negative result must be combined with clinical observations, patient history, and epidemiological information. Fact Sheet for Patients:   StrictlyIdeas.no Fact Sheet for Healthcare Providers: BankingDealers.co.za This test is not yet approved or cleared  by the [place] FDA and has been authorized for detection and/or diagnosis of SARS-CoV-2 by FDA under an Emergency Use Authorization (EUA).  This EUA will remain in effect (meaning this test can be used) for the duration of the COVID-19 declaration under Section 564(b)(1) of the Act, 21 U.S.C. section 360bbb-3(b)(1), unless the authorization is terminated or revoked sooner. Performed at [facility], [place] [address], [place], [place] [address]   Culture, blood (routine x 2)     Status: None (Preliminary result)   Collection Time: 01/23/20 10:25 AM   Specimen: BLOOD  Result Value Ref Range Status   Specimen Description   Final    BLOOD RIGHT ARM Performed at [facility] [address], [place], [place] [address]    Special Requests   Final    BOTTLES DRAWN AEROBIC AND ANAEROBIC Blood Culture adequate volume Performed at [facility] [address] [place],  [address]    Culture   Final    NO GROWTH 2 DAYS Performed at [facility] [address], [place],  [address]    Report Status PENDING  Incomplete  Culture, blood (routine x 2)     Status: None (Preliminary result)   Collection Time: 01/23/20 10:35 AM   Specimen: BLOOD RIGHT HAND  Result Value Ref Range Status  Specimen Description   Final    BLOOD RIGHT HAND Performed at [facility] [address] [place], [place] [address]    Special Requests   Final    BOTTLES DRAWN AEROBIC AND ANAEROBIC Blood Culture adequate volume Performed at [facility] [address]  [place], [place] [address]    Culture   Final    NO GROWTH 2 DAYS Performed at [facility] [address] [place],  [address]    Report Status PENDING  Incomplete    Coagulation Studies: No results for input(s): LABPROT, INR in the last 72 hours.  Urinalysis: Recent Labs    01/22/20 1601  COLORURINE YELLOW  LABSPEC 1.009  PHURINE 5.0  GLUCOSEU NEGATIVE  HGBUR NEGATIVE  BILIRUBINUR NEGATIVE  KETONESUR NEGATIVE  PROTEINUR NEGATIVE  NITRITE NEGATIVE  LEUKOCYTESUR SMALL*      Imaging: No results found.   Medications:   . ampicillin (OMNIPEN) IV 2 g (01/25/20 0550)   . Chlorhexidine Gluconate Cloth  6 each Topical Daily  . furosemide  80 mg Intravenous Q12H  . heparin  5,000 Units Subcutaneous Q8H   acetaminophen **OR** acetaminophen, HYDROcodone-acetaminophen, ondansetron **OR** ondansetron (ZOFRAN) IV  Assessment/ Plan:  1.Renal-acute kidney injury this appears to be in the setting of some chronic kidney damage.  Her renal function with baseline does not appear completely normal and she may indeed have stage III chronic kidney disease.  Her baseline creatinine seems to vary considerably.  This obviously is a change with a creatinine that is increased to above 6 mg/dL.  Noted that she is hypotensive and does take ACE inhibitors, nonsteroidal anti-inflammatory drugs, Cox 2 inhibitors, diuretics in one form or other.  Urinalysis was bland although did show cysts 6-10 WBCs per high-power field with many bacteria.  Renal ultrasound did not reveal any evidence of hydronephrosis  2D echo does not reveal any pericardial effusion.  EF 65%.  We will check a cortisol 2. Hypertension/volume  -appears to have some volume depletion.  Stopped antihypertensive medications and cautiously hydrate.   Antihypertensives discontinued 3.  Metabolic acidosis.  Resolved stopping sodium bicarbonate 4.  Breast cancer-chemotherapy on hold until stabilization of renal function. 5.   Hyponatremia.  Appears to have worsened.  Hyponatremia seems to be improving we will discontinue Lasix at this point 6.  Enterococcus positive blood culture appreciate assistance from Dr Linus Salmons.  Patient started on ampicillin 01/23/2020.  Patient does have Port-A-Cath    LOS: [place] @TODAY @9 :50 AM

## 2020-01-25 NOTE — Progress Notes (Addendum)
PROGRESS NOTE    [name]    Code Status: DNR  ZCH:[id number] DOB: [birth year] DOA: 01/22/2020 LOS: 3 days  PCP: [name], MD CC:  Chief Complaint  Patient presents with  . AKI       Hospital Summary   This is a 64 year old female with history of left breast cancer s/p mastectomy, diverticulitis, hypertension, CKD 3b who presented to the ED from cancer center prior to receiving chemo after she was found to have low blood pressure.  She was given IV fluids which helped her BP but labs showed significantly elevated creatinine and diagnosed with AKI on CKD.  Nephrology was consulted.  She was noted to be on multiple nephrotoxic agents including Benzapril, HCTZ, NSAIDs and also Lasix was stopped by a physician about a week prior to admission.  ED Course: Upon arrival to ED, her blood pressure was fairly normal at 112/71.  There is one reading of 41/32 that is an error.  Patient is asymptomatic.  BMP was repeated which showed creatinine of 6.02 with sodium of 119.  EDP already consulted nephrology and then called hospital service for admission.  5/26: Blood culture positive for Enterococcus and started on ampicillin.  ID consulted  5/28: Ampicillin discontinued as bacteremia thought to be a contaminant.  A & P   Active Problems:   Essential hypertension   S/P left mastectomy   Acute renal failure (ARF) (HCC)   Acute hyponatremia   1. AKI on CKD 3b a. Multifactorial due to nephrotoxic agents and likely volume depletion b. Nephrology to discontinue sodium bicarbonate c. Improving following Lasix  2. Hyponatremia a. Improved after receiving Lasix b. Started on sodium bicarbonate per nephrology which has since been discontinued today c. Lasix discontinued by nephro for now  3. Enterococcus positive blood culture in setting of a Port-A-Cath a. Afebrile without leukocytosis without sign of infection, not in sepsis b. Started on ampicillin on 5/26 c. ID on board: No  indication for Port-A-Cath removal and no indication to continue antibiotics.  Monitor off antibiotics for the next 24 hours and okay from ID standpoint to resume chemotherapy as indicated if blood cultures remain negative d. Echo 5/25 without vegetations noted  4. Hypertension/hypotensive episode a. Holding antihypertensives   5. Metabolic acidosis resolved with sodium bicarb  6. Breast cancer a. Sent in from cancer center prior to starting her chemotherapy b. Follow-up with outpatient oncology  DVT prophylaxis: Heparin Family Communication: Daughter at bedside updated yesterday Disposition Plan:  Status is: Inpatient  Remains inpatient appropriate because:IV treatments appropriate due to intensity of illness or inability to take PO needs resolution of hyponatremia prior to discharge   Dispo: The patient is from: Home              Anticipated d/c is to: Home              Anticipated d/c date is: 2 days              Patient currently is not medically stable to d/c.           Pressure injury documentation   Pressure Injury 12/19/19 Tibial Distal;Left;Posterior Stage 2 -  Partial thickness loss of dermis presenting as a shallow open injury with a red, pink wound bed without slough. Pink, red (Active)  12/19/19 1903  Location: Tibial  Location Orientation: Distal;Left;Posterior  Staging: Stage 2 -  Partial thickness loss of dermis presenting as a shallow open injury with a red, pink wound bed without  slough.  Wound Description (Comments): Pink, red  Present on Admission: Yes     Pressure Injury 12/19/19 Sacrum Medial Stage 2 -  Partial thickness loss of dermis presenting as a shallow open injury with a red, pink wound bed without slough. Pink, red (Active)  12/19/19 1904  Location: Sacrum  Location Orientation: Medial  Staging: Stage 2 -  Partial thickness loss of dermis presenting as a shallow open injury with a red, pink wound bed without slough.  Wound Description  (Comments): Pink, red  Present on Admission: Yes   None  Consultants  Nephrology   Procedures  None  Antibiotics   Anti-infectives (From admission, onward)   Start     Dose/Rate Route Frequency Ordered Stop   01/23/20 1000  ampicillin (OMNIPEN) 2 g in sodium chloride 0.9 % 100 mL IVPB  Status:  Discontinued     2 g 300 mL/hr over 20 Minutes Intravenous Every 8 hours 01/23/20 0924 01/25/20 0951        Subjective   Sitting in chair in no acute distress resting comfortably.  No complaints and no overnight events Objective   Vitals:   01/24/20 1319 01/24/20 2150 01/25/20 0527 01/25/20 1327  BP: (!) 101/57 (!) 98/57 (!) 100/54 (!) 93/57  Pulse: 83 95 94 98  Resp: 17 18 18 15   Temp: 98.5 F (36.9 C) 98.1 F (36.7 C) 97.9 F (36.6 C) 98.3 F (36.8 C)  TempSrc: Oral Oral Oral Oral  SpO2: 97% 100% 99% 100%  Weight:      Height:        Intake/Output Summary (Last 24 hours) at 01/25/2020 1333 Last data filed at 01/25/2020 0935 Gross per 24 hour  Intake 1609.6 ml  Output 100 ml  Net 1509.6 ml   Filed Weights   01/22/20 2327  Weight: (!) 137 kg    Examination:  Physical Exam Vitals and nursing note reviewed.  Constitutional:      Appearance: Normal appearance.  HENT:     Head: Normocephalic and atraumatic.  Cardiovascular:     Rate and Rhythm: Normal rate and regular rhythm.     Heart sounds: Murmur present.     Comments: 2/6 systolic murmur Pulmonary:     Effort: Pulmonary effort is normal.     Breath sounds: Normal breath sounds.  Abdominal:     General: Abdomen is flat.     Palpations: Abdomen is soft.  Skin:    Coloration: Skin is not jaundiced or pale.  Neurological:     Mental Status: She is alert. Mental status is at baseline.  Psychiatric:        Mood and Affect: Mood normal.        Behavior: Behavior normal.     Data Reviewed: I have personally reviewed following labs and imaging studies  CBC: Recent Labs  Lab 01/22/20 0840  01/22/20 1400 01/23/20 0531 01/24/20 0500 01/25/20 0500  WBC 6.3 5.9 6.5 7.2 7.6  NEUTROABS 2.8  --   --   --   --   HGB 8.4* 8.3* 7.8* 9.0* 7.6*  HCT 23.8* 25.1* 22.3* 25.9* 21.8*  MCV 92.2 96.2 94.5 93.2 94.8  PLT 210 203 208 188 696   Basic Metabolic Panel: Recent Labs  Lab 01/22/20 0840 01/22/20 1400 01/23/20 0531 01/23/20 0531 01/24/20 0500 01/24/20 1542 01/24/20 1859 01/24/20 2254 01/25/20 0500  NA   < >  --  120*   < > 118* 123* 122* 124* 125*  123*  K   < >  --  5.0   < > 4.6 4.4 4.1 4.0 4.2  4.0  CL   < >  --  87*   < > 80* 83* 83* 85* 85*  85*  CO2   < >  --  21*   < > 26 28 26 26 26  27   GLUCOSE   < >  --  101*   < > 93 100* 99 123* 104*  104*  BUN   < >  --  80*   < > 71* 74* 72* 67* 65*  67*  CREATININE   < > 5.76* 4.36*   < > 4.04* 3.61* 3.35* 3.34* 3.20*  3.14*  CALCIUM   < >  --  8.0*   < > 7.3* 8.0* 8.0* 7.9* 8.1*  8.2*  MG  --  1.3*  --   --   --   --   --   --   --   PHOS  --   --  4.6  --  4.1  --   --   --  4.4   < > = values in this interval not displayed.   GFR: Estimated Creatinine Clearance: 23.4 mL/min (A) (by C-G formula based on SCr of 3.2 mg/dL (H)). Liver Function Tests: Recent Labs  Lab 01/22/20 0840 01/23/20 0531 01/24/20 0500 01/25/20 0500  AST 35 43*  --   --   ALT 34 33  --   --   ALKPHOS 62 49  --   --   BILITOT 0.2* 0.3  --   --   PROT 7.2 6.4*  --   --   ALBUMIN 2.7* 2.7* 2.6* 2.5*   No results for input(s): LIPASE, AMYLASE in the last 168 hours. No results for input(s): AMMONIA in the last 168 hours. Coagulation Profile: No results for input(s): INR, PROTIME in the last 168 hours. Cardiac Enzymes: No results for input(s): CKTOTAL, CKMB, CKMBINDEX, TROPONINI in the last 168 hours. BNP (last 3 results) No results for input(s): PROBNP in the last 8760 hours. HbA1C: No results for input(s): HGBA1C in the last 72 hours. CBG: No results for input(s): GLUCAP in the last 168 hours. Lipid Profile: No results for  input(s): CHOL, HDL, LDLCALC, TRIG, CHOLHDL, LDLDIRECT in the last 72 hours. Thyroid Function Tests: Recent Labs    01/22/20 1641  TSH 1.802   Anemia Panel: Recent Labs    01/22/20 1641  TIBC 231*  IRON 111   Sepsis Labs: Recent Labs  Lab 01/22/20 1209  LATICACIDVEN 1.5    Recent Results (from the past 240 hour(s))  Culture, blood (Routine X 2) w Reflex to ID Panel     Status: Abnormal   Collection Time: 01/22/20 12:09 PM   Specimen: BLOOD  Result Value Ref Range Status   Specimen Description   Final    BLOOD PORTA CATH Performed at [facility] [address] [place], [place] [address]    Special Requests   Final    BOTTLES DRAWN AEROBIC AND ANAEROBIC Blood Culture adequate volume Performed at [facility] [address], [place], [place] [address]    Culture  Setup Time   Final    GRAM POSITIVE COCCI IN PAIRS IN CHAINS IN BOTH AEROBIC AND ANAEROBIC BOTTLES Organism ID to follow CRITICAL RESULT CALLED TO, READ BACK BY AND VERIFIED WITH: Karie Kirks PharmD 9:00 01/23/20 ([email or web address]) Performed at [facility], [place] [address], [place], [place] [address]  Culture ENTEROCOCCUS GALLINARUM (A)  Final   Report Status 01/25/2020 FINAL  Final   Organism ID, Bacteria ENTEROCOCCUS GALLINARUM  Final      Susceptibility   Enterococcus gallinarum - MIC*    AMPICILLIN <=2 SENSITIVE Sensitive     VANCOMYCIN RESISTANT Resistant     GENTAMICIN SYNERGY SENSITIVE Sensitive     LINEZOLID 1 SENSITIVE Sensitive     * ENTEROCOCCUS GALLINARUM  Blood Culture ID Panel (Reflexed)     Status: Abnormal   Collection Time: 01/22/20 12:09 PM  Result Value Ref Range Status   Enterococcus species DETECTED (A) NOT DETECTED Final    Comment: CRITICAL RESULT CALLED TO, READ BACK BY AND VERIFIED WITH: Karie Kirks PharmD 9:00 01/23/20 ([email or web address])    Vancomycin resistance NOT DETECTED NOT DETECTED Final   Listeria monocytogenes NOT DETECTED NOT DETECTED Final   Staphylococcus  species NOT DETECTED NOT DETECTED Final   Staphylococcus aureus (BCID) NOT DETECTED NOT DETECTED Final   Streptococcus species NOT DETECTED NOT DETECTED Final   Streptococcus agalactiae NOT DETECTED NOT DETECTED Final   Streptococcus pneumoniae NOT DETECTED NOT DETECTED Final   Streptococcus pyogenes NOT DETECTED NOT DETECTED Final   Acinetobacter baumannii NOT DETECTED NOT DETECTED Final   Enterobacteriaceae species NOT DETECTED NOT DETECTED Final   Enterobacter cloacae complex NOT DETECTED NOT DETECTED Final   Escherichia coli NOT DETECTED NOT DETECTED Final   Klebsiella oxytoca NOT DETECTED NOT DETECTED Final   Klebsiella pneumoniae NOT DETECTED NOT DETECTED Final   Proteus species NOT DETECTED NOT DETECTED Final   Serratia marcescens NOT DETECTED NOT DETECTED Final   Haemophilus influenzae NOT DETECTED NOT DETECTED Final   Neisseria meningitidis NOT DETECTED NOT DETECTED Final   Pseudomonas aeruginosa NOT DETECTED NOT DETECTED Final   Candida albicans NOT DETECTED NOT DETECTED Final   Candida glabrata NOT DETECTED NOT DETECTED Final   Candida krusei NOT DETECTED NOT DETECTED Final   Candida parapsilosis NOT DETECTED NOT DETECTED Final   Candida tropicalis NOT DETECTED NOT DETECTED Final    Comment: Performed at [facility], [address], [place], [place] [address]  SARS Coronavirus 2 by RT PCR (hospital order, performed in [facility] hospital lab) Nasopharyngeal Nasopharyngeal Swab     Status: None   Collection Time: 01/22/20 12:10 PM   Specimen: Nasopharyngeal Swab  Result Value Ref Range Status   SARS Coronavirus 2 NEGATIVE NEGATIVE Final    Comment: (NOTE) SARS-CoV-2 target nucleic acids are NOT DETECTED. The SARS-CoV-2 RNA is generally detectable in upper and lower respiratory specimens during the acute phase of infection. The lowest concentration of SARS-CoV-2 viral copies this assay can detect is 250 copies / mL. A negative result does not preclude SARS-CoV-2  infection and should not be used as the sole basis for treatment or other patient management decisions.  A negative result may occur with improper specimen collection / handling, submission of specimen other than nasopharyngeal swab, presence of viral mutation(s) within the areas targeted by this assay, and inadequate number of viral copies (<250 copies / mL). A negative result must be combined with clinical observations, patient history, and epidemiological information. Fact Sheet for Patients:   StrictlyIdeas.no Fact Sheet for Healthcare Providers: BankingDealers.co.za This test is not yet approved or cleared  by the [place] FDA and has been authorized for detection and/or diagnosis of SARS-CoV-2 by FDA under an Emergency Use Authorization (EUA).  This EUA will remain in effect (meaning this test can be used) for the duration of  the COVID-19 declaration under Section 564(b)(1) of the Act, 21 U.S.C. section 360bbb-3(b)(1), unless the authorization is terminated or revoked sooner. Performed at [facility], [place] [address] [place], [place] [address]   Culture, blood (routine x 2)     Status: None (Preliminary result)   Collection Time: 01/23/20 10:25 AM   Specimen: BLOOD  Result Value Ref Range Status   Specimen Description   Final    BLOOD RIGHT ARM Performed at [facility] [address] [place], [place] [address]    Special Requests   Final    BOTTLES DRAWN AEROBIC AND ANAEROBIC Blood Culture adequate volume Performed at [facility] [address] [place], [place] [address]    Culture   Final    NO GROWTH 2 DAYS Performed at [facility] [address], [place], [place] [address]    Report Status PENDING  Incomplete  Culture, blood (routine x 2)     Status: None (Preliminary result)   Collection Time: 01/23/20 10:35 AM   Specimen: BLOOD RIGHT HAND   Result Value Ref Range Status   Specimen Description   Final    BLOOD RIGHT HAND Performed at [facility] [address], [place], [place] [address]    Special Requests   Final    BOTTLES DRAWN AEROBIC AND ANAEROBIC Blood Culture adequate volume Performed at [facility] [address] [place], [place] [address]    Culture   Final    NO GROWTH 2 DAYS Performed at [facility] [address] [place], [place] [address]    Report Status PENDING  Incomplete         Radiology Studies: No results found.      Scheduled Meds: . Chlorhexidine Gluconate Cloth  6 each Topical Daily  . heparin  5,000 Units Subcutaneous Q8H   Continuous Infusions:    Time spent: 25 minutes with over 50% of the time coordinating the patient's care    Harold Hedge, DO [facility] Pager [phone]  Call night coverage person covering after 7pm

## 2020-01-25 NOTE — Plan of Care (Signed)
VS WNL this am.  Patient does not have complaints at this time.   Problem: Health Behavior/Discharge Planning: Goal: Ability to manage health-related needs will improve Outcome: Progressing   Problem: Clinical Measurements: Goal: Will remain free from infection Outcome: Progressing   Problem: Activity: Goal: Risk for activity intolerance will decrease Outcome: Progressing   Problem: Nutrition: Goal: Adequate nutrition will be maintained Outcome: Progressing   Problem: Safety: Goal: Ability to remain free from injury will improve Outcome: Progressing   Problem: Skin Integrity: Goal: Risk for impaired skin integrity will decrease Outcome: Progressing

## 2020-01-26 LAB — CBC
HCT: 20.5 % — ABNORMAL LOW (ref 36.0–46.0)
HCT: 22.1 % — ABNORMAL LOW (ref 36.0–46.0)
Hemoglobin: 7 g/dL — ABNORMAL LOW (ref 12.0–15.0)
Hemoglobin: 7.7 g/dL — ABNORMAL LOW (ref 12.0–15.0)
MCH: 32.4 pg (ref 26.0–34.0)
MCH: 33.3 pg (ref 26.0–34.0)
MCHC: 34.1 g/dL (ref 30.0–36.0)
MCHC: 34.8 g/dL (ref 30.0–36.0)
MCV: 94.9 fL (ref 80.0–100.0)
MCV: 95.7 fL (ref 80.0–100.0)
Platelets: 209 10*3/uL (ref 150–400)
Platelets: 253 10*3/uL (ref 150–400)
RBC: 2.16 MIL/uL — ABNORMAL LOW (ref 3.87–5.11)
RBC: 2.31 MIL/uL — ABNORMAL LOW (ref 3.87–5.11)
RDW: 12.8 % (ref 11.5–15.5)
RDW: 13 % (ref 11.5–15.5)
WBC: 8.7 10*3/uL (ref 4.0–10.5)
WBC: 9.5 10*3/uL (ref 4.0–10.5)
nRBC: 0 % (ref 0.0–0.2)
nRBC: 0 % (ref 0.0–0.2)

## 2020-01-26 LAB — BASIC METABOLIC PANEL
Anion gap: 9 (ref 5–15)
BUN: 64 mg/dL — ABNORMAL HIGH (ref 8–23)
CO2: 28 mmol/L (ref 22–32)
Calcium: 8.2 mg/dL — ABNORMAL LOW (ref 8.9–10.3)
Chloride: 88 mmol/L — ABNORMAL LOW (ref 98–111)
Creatinine, Ser: 2.65 mg/dL — ABNORMAL HIGH (ref 0.44–1.00)
GFR calc Af Amer: 21 mL/min — ABNORMAL LOW (ref >60)
GFR calc non Af Amer: 18 mL/min — ABNORMAL LOW (ref >60)
Glucose, Bld: 101 mg/dL — ABNORMAL HIGH (ref 70–99)
Potassium: 4.2 mmol/L (ref 3.5–5.1)
Sodium: 125 mmol/L — ABNORMAL LOW (ref 135–145)

## 2020-01-26 LAB — RENAL FUNCTION PANEL
Albumin: 2.5 g/dL — ABNORMAL LOW (ref 3.5–5.0)
Anion gap: 9 (ref 5–15)
BUN: 63 mg/dL — ABNORMAL HIGH (ref 8–23)
CO2: 29 mmol/L (ref 22–32)
Calcium: 8.3 mg/dL — ABNORMAL LOW (ref 8.9–10.3)
Chloride: 87 mmol/L — ABNORMAL LOW (ref 98–111)
Creatinine, Ser: 2.55 mg/dL — ABNORMAL HIGH (ref 0.44–1.00)
GFR calc Af Amer: 22 mL/min — ABNORMAL LOW (ref >60)
GFR calc non Af Amer: 19 mL/min — ABNORMAL LOW (ref >60)
Glucose, Bld: 103 mg/dL — ABNORMAL HIGH (ref 70–99)
Phosphorus: 3.9 mg/dL (ref 2.5–4.6)
Potassium: 4.2 mmol/L (ref 3.5–5.1)
Sodium: 125 mmol/L — ABNORMAL LOW (ref 135–145)

## 2020-01-26 NOTE — Progress Notes (Signed)
[name] ROUNDING NOTE   Subjective:   64 year old lady history of left breast cancer status post mastectomy April 2021.  Baseline serum creatinine appears fairly variable but has ranged anywhere from 1.26 mg/dL to about 2.4 mg/dL.  She is due to start chemotherapy in the morning of Jan 22, 2020 routine labs showed a serum creatinine of 6 mg/dL the blood pressure 80 mmHg.  She was taking ACE inhibitor [place] nasal pill, the diuretic Lasix and hydrochlorothiazide and also combination nonsteroidal inflammatories and Cox 2 inhibitors.  Pressure 100/52 pulse 97 temperature 98.8  2D echo no pericardial effusion  Urine osmolality 208 urine sodium 19  Appears to have good urine output 1100 cc out 01/25/2020  IV Omnipen 2 g every 8 hours  Sodium 125 potassium 4.2 chloride 87 CO2 29 glucose 103 BUN 60 creatinine 2.55 calcium 8.3 phosphorus 2.5 hemoglobin 7.0  Objective:  Vital signs in last 24 hours:  Temp:  [98.3 F (36.8 C)-98.8 F (37.1 C)] 98.8 F (37.1 C) (05/29 0557) Pulse Rate:  [97-110] 97 (05/29 0557) Resp:  [15-18] 16 (05/29 0557) BP: (93-117)/(52-63) 100/52 (05/29 0557) SpO2:  [95 %-100 %] 98 % (05/29 0557)  Weight change:  Filed Weights   01/22/20 2327  Weight: (!) 137 kg    Intake/Output: I/O last 3 completed shifts: In: 1849.6 [P.O.:480; I.V.:825; IV Piggyback:544.6] Out: 2100 [Urine:2100]   Intake/Output this shift:  No intake/output data recorded.  General: Calm nondistressed HEENT: None traumatic external appearance normal oropharynx clear Eyes: Pupils were round equal reactive Neck: Supple no masses or thyromegaly Heart: Regular rate and rhythm no murmurs rubs or gallops chronic bilateral leg edema with 2+ pedal pulses. Lungs: Clear to auscultation bilaterally no wheezes rales or crackles Abdomen: Soft nontender bowel sounds are present Extremities: Chronic lower extremity lymphedema Skin: No skin rashes no itching Neuro: Cranial nerves  intact sensation strength regular 5/5   Basic Metabolic Panel: Recent Labs  Lab 01/22/20 0840 01/22/20 1400 01/23/20 0531 01/23/20 0531 01/24/20 0500 01/24/20 0500 01/24/20 1542 01/24/20 1542 01/24/20 1859 01/24/20 1859 01/24/20 2254 01/25/20 0500 01/26/20 0500  NA   < >  --  120*   < > 118*   < > 123*  --  122*  --  124* 125*  123* 125*  125*  K   < >  --  5.0   < > 4.6   < > 4.4  --  4.1  --  4.0 4.2  4.0 4.2  4.2  CL   < >  --  87*   < > 80*   < > 83*  --  83*  --  85* 85*  85* 87*  88*  CO2   < >  --  21*   < > 26   < > 28  --  26  --  26 26  27 29  28   GLUCOSE   < >  --  101*   < > 93   < > 100*  --  99  --  123* 104*  104* 103*  101*  BUN   < >  --  80*   < > 71*   < > 74*  --  72*  --  67* 65*  67* 63*  64*  CREATININE   < > 5.76* 4.36*   < > 4.04*   < > 3.61*  --  3.35*  --  3.34* 3.20*  3.14* 2.55*  2.65*  CALCIUM   < >  --  8.0*   < > 7.3*   < > 8.0*   < > 8.0*   < > 7.9* 8.1*  8.2* 8.3*  8.2*  MG  --  1.3*  --   --   --   --   --   --   --   --   --   --   --   PHOS  --   --  4.6  --  4.1  --   --   --   --   --   --  4.4 3.9   < > = values in this interval not displayed.    Liver Function Tests: Recent Labs  Lab 01/22/20 0840 01/23/20 0531 01/24/20 0500 01/25/20 0500 01/26/20 0500  AST 35 43*  --   --   --   ALT 34 33  --   --   --   ALKPHOS 62 49  --   --   --   BILITOT 0.2* 0.3  --   --   --   PROT 7.2 6.4*  --   --   --   ALBUMIN 2.7* 2.7* 2.6* 2.5* 2.5*   No results for input(s): LIPASE, AMYLASE in the last 168 hours. No results for input(s): AMMONIA in the last 168 hours.  CBC: Recent Labs  Lab 01/22/20 0840 01/22/20 0840 01/22/20 1400 01/23/20 0531 01/24/20 0500 01/25/20 0500 01/26/20 0500  WBC 6.3   < > 5.9 6.5 7.2 7.6 8.7  NEUTROABS 2.8  --   --   --   --   --   --   HGB 8.4*   < > 8.3* 7.8* 9.0* 7.6* 7.0*  HCT 23.8*   < > 25.1* 22.3* 25.9* 21.8* 20.5*  MCV 92.2   < > 96.2 94.5 93.2 94.8 94.9  PLT 210   < > 203 208  188 213 209   < > = values in this interval not displayed.    Cardiac Enzymes: No results for input(s): CKTOTAL, CKMB, CKMBINDEX, TROPONINI in the last 168 hours.  BNP: Invalid input(s): POCBNP  CBG: No results for input(s): GLUCAP in the last 168 hours.  Microbiology: Results for orders placed or performed during the hospital encounter of 01/22/20  Culture, blood (Routine X 2) w Reflex to ID Panel     Status: Abnormal   Collection Time: 01/22/20 12:09 PM   Specimen: BLOOD  Result Value Ref Range Status   Specimen Description   Final    BLOOD PORTA CATH Performed at [facility], [place] [address] [place], [place] [address]    Special Requests   Final    BOTTLES DRAWN AEROBIC AND ANAEROBIC Blood Culture adequate volume Performed at [place] [address], [place], [place] [address]    Culture  Setup Time   Final    GRAM POSITIVE COCCI IN PAIRS IN CHAINS IN BOTH AEROBIC AND ANAEROBIC BOTTLES Organism ID to follow CRITICAL RESULT CALLED TO, READ BACK BY AND VERIFIED WITH: Karie Kirks PharmD 9:00 01/23/20 ([email or web address]) Performed at [facility], [place] [address] [place], [place] [address]    Culture ENTEROCOCCUS GALLINARUM (A)  Final   Report Status 01/25/2020 FINAL  Final   Organism ID, Bacteria ENTEROCOCCUS GALLINARUM  Final      Susceptibility   Enterococcus gallinarum - MIC*    AMPICILLIN <=2 SENSITIVE Sensitive     VANCOMYCIN RESISTANT Resistant     GENTAMICIN SYNERGY SENSITIVE Sensitive  LINEZOLID 1 SENSITIVE Sensitive     * ENTEROCOCCUS GALLINARUM  Blood Culture ID Panel (Reflexed)     Status: Abnormal   Collection Time: 01/22/20 12:09 PM  Result Value Ref Range Status   Enterococcus species DETECTED (A) NOT DETECTED Final    Comment: CRITICAL RESULT CALLED TO, READ BACK BY AND VERIFIED WITH: Karie Kirks PharmD 9:00 01/23/20 ([email or web address])    Vancomycin resistance NOT DETECTED NOT DETECTED Final   Listeria monocytogenes NOT DETECTED NOT  DETECTED Final   Staphylococcus species NOT DETECTED NOT DETECTED Final   Staphylococcus aureus (BCID) NOT DETECTED NOT DETECTED Final   Streptococcus species NOT DETECTED NOT DETECTED Final   Streptococcus agalactiae NOT DETECTED NOT DETECTED Final   Streptococcus pneumoniae NOT DETECTED NOT DETECTED Final   Streptococcus pyogenes NOT DETECTED NOT DETECTED Final   Acinetobacter baumannii NOT DETECTED NOT DETECTED Final   Enterobacteriaceae species NOT DETECTED NOT DETECTED Final   Enterobacter cloacae complex NOT DETECTED NOT DETECTED Final   Escherichia coli NOT DETECTED NOT DETECTED Final   Klebsiella oxytoca NOT DETECTED NOT DETECTED Final   Klebsiella pneumoniae NOT DETECTED NOT DETECTED Final   Proteus species NOT DETECTED NOT DETECTED Final   Serratia marcescens NOT DETECTED NOT DETECTED Final   Haemophilus influenzae NOT DETECTED NOT DETECTED Final   Neisseria meningitidis NOT DETECTED NOT DETECTED Final   Pseudomonas aeruginosa NOT DETECTED NOT DETECTED Final   Candida albicans NOT DETECTED NOT DETECTED Final   Candida glabrata NOT DETECTED NOT DETECTED Final   Candida krusei NOT DETECTED NOT DETECTED Final   Candida parapsilosis NOT DETECTED NOT DETECTED Final   Candida tropicalis NOT DETECTED NOT DETECTED Final    Comment: Performed at [facility], [address] [place], [place] [address]  SARS Coronavirus 2 by RT PCR (hospital order, performed in [facility] hospital lab) Nasopharyngeal Nasopharyngeal Swab     Status: None   Collection Time: 01/22/20 12:10 PM   Specimen: Nasopharyngeal Swab  Result Value Ref Range Status   SARS Coronavirus 2 NEGATIVE NEGATIVE Final    Comment: (NOTE) SARS-CoV-2 target nucleic acids are NOT DETECTED. The SARS-CoV-2 RNA is generally detectable in upper and lower respiratory specimens during the acute phase of infection. The lowest concentration of SARS-CoV-2 viral copies this assay can detect is 250 copies / mL. A negative result  does not preclude SARS-CoV-2 infection and should not be used as the sole basis for treatment or other patient management decisions.  A negative result may occur with improper specimen collection / handling, submission of specimen other than nasopharyngeal swab, presence of viral mutation(s) within the areas targeted by this assay, and inadequate number of viral copies (<250 copies / mL). A negative result must be combined with clinical observations, patient history, and epidemiological information. Fact Sheet for Patients:   StrictlyIdeas.no Fact Sheet for Healthcare Providers: BankingDealers.co.za This test is not yet approved or cleared  by the [place] FDA and has been authorized for detection and/or diagnosis of SARS-CoV-2 by FDA under an Emergency Use Authorization (EUA).  This EUA will remain in effect (meaning this test can be used) for the duration of the COVID-19 declaration under Section 564(b)(1) of the Act, 21 U.S.C. section 360bbb-3(b)(1), unless the authorization is terminated or revoked sooner. Performed at [facility], [place] [address], [place], [place] [address]   Culture, blood (routine x 2)     Status: None (Preliminary result)   Collection Time: 01/23/20 10:25 AM   Specimen: BLOOD  Result Value Ref Range Status   Specimen Description   Final    BLOOD RIGHT ARM Performed at [place] [address], [place], [place] [address]    Special Requests   Final    BOTTLES DRAWN AEROBIC AND ANAEROBIC Blood Culture adequate volume Performed at [place] [address] [place], [place] [address]    Culture   Final    NO GROWTH 3 DAYS Performed at [facility], [place] [address], [place], [place] [address]    Report Status PENDING  Incomplete  Culture, blood (routine x 2)     Status: None (Preliminary result)   Collection Time: 01/23/20 10:35 AM    Specimen: BLOOD RIGHT HAND  Result Value Ref Range Status   Specimen Description   Final    BLOOD RIGHT HAND Performed at [place] [address], [place], [place] [address]    Special Requests   Final    BOTTLES DRAWN AEROBIC AND ANAEROBIC Blood Culture adequate volume Performed at [place] [address], [place], [place] [address]    Culture   Final    NO GROWTH 3 DAYS Performed at [facility], [place] [address], [place], [place] [address]    Report Status PENDING  Incomplete    Coagulation Studies: No results for input(s): LABPROT, INR in the last 72 hours.  Urinalysis: No results for input(s): COLORURINE, LABSPEC, PHURINE, GLUCOSEU, HGBUR, BILIRUBINUR, KETONESUR, PROTEINUR, UROBILINOGEN, NITRITE, LEUKOCYTESUR in the last 72 hours.  Invalid input(s): APPERANCEUR    Imaging: No results found.   Medications:    . Chlorhexidine Gluconate Cloth  6 each Topical Daily  . heparin  5,000 Units Subcutaneous Q8H   acetaminophen **OR** acetaminophen, HYDROcodone-acetaminophen, ondansetron **OR** ondansetron (ZOFRAN) IV  Assessment/ Plan:  1.Renal-acute kidney injury this appears to be in the setting of some chronic kidney damage.  Her renal function with baseline does not appear completely normal and she may indeed have stage III chronic kidney disease.  Her baseline creatinine seems to vary considerably.  This obviously is a change with a creatinine that is increased to above 6 mg/dL.  Noted that she is hypotensive and does take ACE inhibitors, nonsteroidal anti-inflammatory drugs, Cox 2 inhibitors, diuretics in one form or other.  Urinalysis was bland although did show cysts 6-10 WBCs per high-power field with many bacteria.  Renal ultrasound did not reveal any evidence of hydronephrosis  2D echo does not reveal any pericardial effusion.  EF 65%.  Cortisol 11.8 renal function continues to improve 2. Hypertension/volume  -appears  to have some volume depletion.  Stopped antihypertensive medications and cautiously hydrate.   Antihypertensives discontinued 3.  Metabolic acidosis.  Resolved stopping sodium bicarbonate 4.  Breast cancer-chemotherapy on hold until stabilization of renal function. 5.   Hyponatremia seems to be improved with improving urine output have discontinued Lasix 6.  Enterococcus positive blood culture appreciate assistance from Dr Linus Salmons.  Patient started on ampicillin 01/23/2020.  Patient does have Port-A-Cath    LOS: [place] @TODAY @7 :53 AM

## 2020-01-26 NOTE — Evaluation (Signed)
Physical Therapy Evaluation Patient Details Name: [name] MRN: [id number] DOB: 04-05-1956 Today's Date: 01/26/2020   History of Present Illness  Pt is 64 yo female with hx of L breast CA s/p mastectomy, diverticulitis, hypertension, CKD 3b who presented to the ED from cancer center prior to receiving chemo after she was found to have low blood pressure.  She was given IV fluids which helped her BP but labs showed significantly elevated creatinine and diagnosed with AKI on CKD.  Clinical Impression  Pt admitted with above diagnosis. Pt was able to transfer and ambulate with min guard to min A level.  She ambulated 52' with RW without LOB but did easily fatigue with DOE of 3/4 and elevated HR.  Pt has excellent support and DME at home.  Pt normally independent and ambulates community distances.  Pt with good rehab potential and will benefit from PT.  Pt currently with functional limitations due to the deficits listed below (see PT Problem List). Pt will benefit from skilled PT to increase their independence and safety with mobility to allow discharge to the venue listed below.       Follow Up Recommendations Home health PT;Supervision/Assistance - 24 hour    Equipment Recommendations  None recommended by PT(has DME)    Recommendations for Other Services       Precautions / Restrictions Precautions Precautions: None      Mobility  Bed Mobility Overal bed mobility: Needs Assistance Bed Mobility: Sit to Supine       Sit to supine: Min assist   General bed mobility comments: min A for legs back into bed  Transfers Overall transfer level: Needs assistance Equipment used: Rolling walker (2 wheeled) Transfers: Sit to/from Stand Sit to Stand: Min guard            Ambulation/Gait Ambulation/Gait assistance: Min guard Gait Distance (Feet): 35 Feet Assistive device: Rolling walker (2 wheeled) Gait Pattern/deviations: Wide base of support;Shuffle;Decreased stride  length Gait velocity: decreased   General Gait Details: increased lateral weight shift; easily fatigued with DOE of 3/4; O2 sats 98% and HR up to 128 bpm with activity; on RA  Stairs            Wheelchair Mobility    Modified Rankin (Stroke Patients Only)       Balance Overall balance assessment: Needs assistance Sitting-balance support: Feet supported;No upper extremity supported Sitting balance-Leahy Scale: Good     Standing balance support: Bilateral upper extremity supported Standing balance-Leahy Scale: Fair Standing balance comment: used RW                             Pertinent Vitals/Pain Pain Assessment: No/denies pain    Home Living Family/patient expects to be discharged to:: Private residence Living Arrangements: Other relatives(sister) Available Help at Discharge: Family;Available 24 hours/day(sister lives with her; son and 4 grandkids are a block away; lots of help) Type of Home: Apartment Home Access: Level entry     Home Layout: One level Home Equipment: Grab bars - toilet;Grab bars - tub/shower;Wheelchair - [employer] - 2 wheels;Cane - single point;Shower seat      Prior Function Level of Independence: Needs assistance   Gait / Transfers Assistance Needed: Reports can ambulate community distances without AD  ADL's / Homemaking Assistance Needed: Reports independent with ADLs and light IADLs  Comments: Reports hardest thing is getting up and down; Typically sleeps in straight back chair with foot stool  Hand Dominance        Extremity/Trunk Assessment   Upper Extremity Assessment Upper Extremity Assessment: RUE deficits/detail;LUE deficits/detail RUE Deficits / Details: ROM WFL: MMT 4/5 LUE Deficits / Details: ROM WFL: MMT 4/5    Lower Extremity Assessment Lower Extremity Assessment: LLE deficits/detail;RLE deficits/detail RLE Deficits / Details: ROM WFL: MMT 4/5 LLE Deficits / Details: ROM WFL: MMT 4/5     Cervical / Trunk Assessment Cervical / Trunk Assessment: Other exceptions Cervical / Trunk Exceptions: some limitations in trunk and extremity mobility due to body habitus  Communication      Cognition Arousal/Alertness: Awake/alert Behavior During Therapy: WFL for tasks assessed/performed Overall Cognitive Status: Within Functional Limits for tasks assessed                                        General Comments      Exercises     Assessment/Plan    PT Assessment Patient needs continued PT services  PT Problem List Decreased strength;Decreased mobility;Decreased safety awareness;Decreased range of motion;Decreased coordination;Decreased activity tolerance;Cardiopulmonary status limiting activity;Decreased balance;Decreased knowledge of use of DME       PT Treatment Interventions DME instruction;Therapeutic activities;Gait training;Therapeutic exercise;Patient/family education;Balance training;Functional mobility training    PT Goals (Current goals can be found in the Care Plan section)  Acute Rehab PT Goals Patient Stated Goal: return home; build endurance PT Goal Formulation: With patient Time For Goal Achievement: 02/09/20 Potential to Achieve Goals: Good    Frequency Min 3X/week   Barriers to discharge        Co-evaluation               AM-PAC PT "6 Clicks" Mobility  Outcome Measure Help needed turning from your back to your side while in a flat bed without using bedrails?: A Little Help needed moving from lying on your back to sitting on the side of a flat bed without using bedrails?: A Little Help needed moving to and from a bed to a chair (including a wheelchair)?: None Help needed standing up from a chair using your arms (e.g., wheelchair or bedside chair)?: None Help needed to walk in hospital room?: None Help needed climbing 3-5 steps with a railing? : A Lot 6 Click Score: 20    End of Session   Activity Tolerance: Patient  tolerated treatment well Patient left: in bed;with call bell/phone within reach Nurse Communication: Mobility status PT Visit Diagnosis: Other abnormalities of gait and mobility (R26.89);Muscle weakness (generalized) (M62.81)    Time: 3953-2023 PT Time Calculation (min) (ACUTE ONLY): 25 min   Charges:   PT Evaluation $PT Eval Low Complexity: 1 Low          Maggie Font, PT Acute Rehab Services Pager [phone] [facility] [phone] [facility] [place] 01/26/2020, 4:34 PM

## 2020-01-26 NOTE — Progress Notes (Addendum)
PROGRESS NOTE    [name]    Code Status: DNR  OQH:[id number] DOB: 05/23/1956 DOA: 01/22/2020 LOS: 4 days  PCP: [name], MD CC:  Chief Complaint  Patient presents with  . AKI       Hospital Summary   This is a 64 year old female with history of left breast cancer s/p mastectomy, diverticulitis, hypertension, CKD 3b who presented to the ED from cancer center prior to receiving chemo after she was found to have low blood pressure.  She was given IV fluids which helped her BP but labs showed significantly elevated creatinine and diagnosed with AKI on CKD.  Nephrology was consulted.  She was noted to be on multiple nephrotoxic agents including Benzapril, HCTZ, NSAIDs and also Lasix was stopped by a physician about a week prior to admission.  ED Course: Upon arrival to ED, her blood pressure was fairly normal at 112/71.  There is one reading of 41/32 that is an error.  Patient is asymptomatic.  BMP was repeated which showed creatinine of 6.02 with sodium of 119.  EDP already consulted nephrology and then called hospital service for admission.  5/26: Blood culture positive for Enterococcus and started on ampicillin.  ID consulted  5/28: Ampicillin discontinued as bacteremia thought to be a contaminant.  A & P   Active Problems:   Essential hypertension   S/P left mastectomy   Acute renal failure (ARF) (HCC)   Acute hyponatremia   1. AKI on CKD 3b a. Multifactorial due to nephrotoxic agents and likely volume depletion b. Improved after lasix and sodium bicarbonate, now off lasix and bicarb c. nephro on board  2. Asymptomatic Hyponatremia a. Stable at 125 today  b. Appreciate nephrology's assistance with this c. Pt/ot  3. Enterococcus positive blood culture in setting of a Port-A-Cath, likely contaminant a. Afebrile without leukocytosis without sign of infection, not in sepsis b. Started on ampicillin on 5/26->5/28, doing well off antibiotics c. ID consulted: No  indication for Port-A-Cath removal and no indication to continue antibiotics. okay from ID standpoint to resume chemotherapy as indicated if blood cultures remain negative d. Echo 5/25 without vegetations noted  4. Hypertension/hypotensive episode a. Holding antihypertensives   5. Normocytic anemia a. Hb 7.0 b. Recheck and consider transfusion if <7.0  6. Metabolic acidosis resolved with sodium bicarb  7. Breast cancer a. Sent in from cancer center prior to starting her chemotherapy b. Follow-up with outpatient oncology  DVT prophylaxis: Heparin Family Communication: no family at bedside Disposition Plan:  Status is: Inpatient  Remains inpatient appropriate because:IV treatments appropriate due to intensity of illness or inability to take PO needs resolution of hyponatremia prior to discharge   Dispo: The patient is from: Home              Anticipated d/c is to: Home              Anticipated d/c date is: 2 days              Patient currently is not medically stable to d/c.           Pressure injury documentation   Pressure Injury 12/19/19 Tibial Distal;Left;Posterior Stage 2 -  Partial thickness loss of dermis presenting as a shallow open injury with a red, pink wound bed without slough. Pink, red (Active)  12/19/19 1903  Location: Tibial  Location Orientation: Distal;Left;Posterior  Staging: Stage 2 -  Partial thickness loss of dermis presenting as a shallow open injury with a red,  pink wound bed without slough.  Wound Description (Comments): Pink, red  Present on Admission: Yes     Pressure Injury 12/19/19 Sacrum Medial Stage 2 -  Partial thickness loss of dermis presenting as a shallow open injury with a red, pink wound bed without slough. Pink, red (Active)  12/19/19 1904  Location: Sacrum  Location Orientation: Medial  Staging: Stage 2 -  Partial thickness loss of dermis presenting as a shallow open injury with a red, pink wound bed without slough.  Wound  Description (Comments): Pink, red  Present on Admission: Yes   None  Consultants  Nephrology   Procedures  None  Antibiotics   Anti-infectives (From admission, onward)   Start     Dose/Rate Route Frequency Ordered Stop   01/23/20 1000  ampicillin (OMNIPEN) 2 g in sodium chloride 0.9 % 100 mL IVPB  Status:  Discontinued     2 g 300 mL/hr over 20 Minutes Intravenous Every 8 hours 01/23/20 0924 01/25/20 0951        Subjective   Doing well, no complaints, states she hasnt ambulated much.  Objective   Vitals:   01/25/20 0527 01/25/20 1327 01/25/20 2135 01/26/20 0557  BP: (!) 100/54 (!) 93/57 117/63 (!) 100/52  Pulse: 94 98 (!) 110 97  Resp: 18 15 18 16   Temp: 97.9 F (36.6 C) 98.3 F (36.8 C) 98.5 F (36.9 C) 98.8 F (37.1 C)  TempSrc: Oral Oral Oral Oral  SpO2: 99% 100% 95% 98%  Weight:      Height:        Intake/Output Summary (Last 24 hours) at 01/26/2020 1129 Last data filed at 01/26/2020 0102 Gross per 24 hour  Intake 240 ml  Output 2000 ml  Net -1760 ml   Filed Weights   01/22/20 2327  Weight: (!) 137 kg    Examination:  Physical Exam Vitals and nursing note reviewed.  Constitutional:      Appearance: Normal appearance.  HENT:     Head: Normocephalic and atraumatic.  Cardiovascular:     Rate and Rhythm: Normal rate and regular rhythm.  Pulmonary:     Effort: Pulmonary effort is normal.     Breath sounds: Normal breath sounds.  Abdominal:     General: Abdomen is flat.     Palpations: Abdomen is soft.  Skin:    Coloration: Skin is not jaundiced or pale.  Neurological:     Mental Status: She is alert. Mental status is at baseline.  Psychiatric:        Mood and Affect: Mood normal.        Behavior: Behavior normal.     Data Reviewed: I have personally reviewed following labs and imaging studies  CBC: Recent Labs  Lab 01/22/20 0840 01/22/20 0840 01/22/20 1400 01/23/20 0531 01/24/20 0500 01/25/20 0500 01/26/20 0500  WBC 6.3   < >  5.9 6.5 7.2 7.6 8.7  NEUTROABS 2.8  --   --   --   --   --   --   HGB 8.4*   < > 8.3* 7.8* 9.0* 7.6* 7.0*  HCT 23.8*   < > 25.1* 22.3* 25.9* 21.8* 20.5*  MCV 92.2   < > 96.2 94.5 93.2 94.8 94.9  PLT 210   < > 203 208 188 213 209   < > = values in this interval not displayed.   Basic Metabolic Panel: Recent Labs  Lab 01/22/20 0840 01/22/20 1400 01/23/20 0531 01/23/20 0531 01/24/20 0500 01/24/20 0500 01/24/20  1542 01/24/20 1859 01/24/20 2254 01/25/20 0500 01/26/20 0500  NA   < >  --  120*   < > 118*   < > 123* 122* 124* 125*  123* 125*  125*  K   < >  --  5.0   < > 4.6   < > 4.4 4.1 4.0 4.2  4.0 4.2  4.2  CL   < >  --  87*   < > 80*   < > 83* 83* 85* 85*  85* 87*  88*  CO2   < >  --  21*   < > 26   < > 28 26 26 26  27 29  28   GLUCOSE   < >  --  101*   < > 93   < > 100* 99 123* 104*  104* 103*  101*  BUN   < >  --  80*   < > 71*   < > 74* 72* 67* 65*  67* 63*  64*  CREATININE   < > 5.76* 4.36*   < > 4.04*   < > 3.61* 3.35* 3.34* 3.20*  3.14* 2.55*  2.65*  CALCIUM   < >  --  8.0*   < > 7.3*   < > 8.0* 8.0* 7.9* 8.1*  8.2* 8.3*  8.2*  MG  --  1.3*  --   --   --   --   --   --   --   --   --   PHOS  --   --  4.6  --  4.1  --   --   --   --  4.4 3.9   < > = values in this interval not displayed.   GFR: Estimated Creatinine Clearance: 29.4 mL/min (A) (by C-G formula based on SCr of 2.55 mg/dL (H)). Liver Function Tests: Recent Labs  Lab 01/22/20 0840 01/23/20 0531 01/24/20 0500 01/25/20 0500 01/26/20 0500  AST 35 43*  --   --   --   ALT 34 33  --   --   --   ALKPHOS 62 49  --   --   --   BILITOT 0.2* 0.3  --   --   --   PROT 7.2 6.4*  --   --   --   ALBUMIN 2.7* 2.7* 2.6* 2.5* 2.5*   No results for input(s): LIPASE, AMYLASE in the last 168 hours. No results for input(s): AMMONIA in the last 168 hours. Coagulation Profile: No results for input(s): INR, PROTIME in the last 168 hours. Cardiac Enzymes: No results for input(s): CKTOTAL, CKMB, CKMBINDEX,  TROPONINI in the last 168 hours. BNP (last 3 results) No results for input(s): PROBNP in the last 8760 hours. HbA1C: No results for input(s): HGBA1C in the last 72 hours. CBG: No results for input(s): GLUCAP in the last 168 hours. Lipid Profile: No results for input(s): CHOL, HDL, LDLCALC, TRIG, CHOLHDL, LDLDIRECT in the last 72 hours. Thyroid Function Tests: No results for input(s): TSH, T4TOTAL, FREET4, T3FREE, THYROIDAB in the last 72 hours. Anemia Panel: No results for input(s): VITAMINB12, FOLATE, FERRITIN, TIBC, IRON, RETICCTPCT in the last 72 hours. Sepsis Labs: Recent Labs  Lab 01/22/20 1209  LATICACIDVEN 1.5    Recent Results (from the past 240 hour(s))  Culture, blood (Routine X 2) w Reflex to ID Panel     Status: Abnormal   Collection Time: 01/22/20 12:09 PM   Specimen: BLOOD  Result  Value Ref Range Status   Specimen Description   Final    BLOOD PORTA CATH Performed at [facility], [address], [place], [place] [address]    Special Requests   Final    BOTTLES DRAWN AEROBIC AND ANAEROBIC Blood Culture adequate volume Performed at [facility] [address], [place], [place] [address]    Culture  Setup Time   Final    GRAM POSITIVE COCCI IN PAIRS IN CHAINS IN BOTH AEROBIC AND ANAEROBIC BOTTLES Organism ID to follow CRITICAL RESULT CALLED TO, READ BACK BY AND VERIFIED WITH: Karie Kirks PharmD 9:00 01/23/20 ([email or web address]) Performed at [facility], [place] [address], [place], [place] [address]    Culture ENTEROCOCCUS GALLINARUM (A)  Final   Report Status 01/25/2020 FINAL  Final   Organism ID, Bacteria ENTEROCOCCUS GALLINARUM  Final      Susceptibility   Enterococcus gallinarum - MIC*    AMPICILLIN <=2 SENSITIVE Sensitive     VANCOMYCIN RESISTANT Resistant     GENTAMICIN SYNERGY SENSITIVE Sensitive     LINEZOLID 1 SENSITIVE Sensitive     * ENTEROCOCCUS GALLINARUM  Blood Culture ID Panel (Reflexed)     Status: Abnormal   Collection Time:  01/22/20 12:09 PM  Result Value Ref Range Status   Enterococcus species DETECTED (A) NOT DETECTED Final    Comment: CRITICAL RESULT CALLED TO, READ BACK BY AND VERIFIED WITH: Karie Kirks PharmD 9:00 01/23/20 ([email or web address])    Vancomycin resistance NOT DETECTED NOT DETECTED Final   Listeria monocytogenes NOT DETECTED NOT DETECTED Final   Staphylococcus species NOT DETECTED NOT DETECTED Final   Staphylococcus aureus (BCID) NOT DETECTED NOT DETECTED Final   Streptococcus species NOT DETECTED NOT DETECTED Final   Streptococcus agalactiae NOT DETECTED NOT DETECTED Final   Streptococcus pneumoniae NOT DETECTED NOT DETECTED Final   Streptococcus pyogenes NOT DETECTED NOT DETECTED Final   Acinetobacter baumannii NOT DETECTED NOT DETECTED Final   Enterobacteriaceae species NOT DETECTED NOT DETECTED Final   Enterobacter cloacae complex NOT DETECTED NOT DETECTED Final   Escherichia coli NOT DETECTED NOT DETECTED Final   Klebsiella oxytoca NOT DETECTED NOT DETECTED Final   Klebsiella pneumoniae NOT DETECTED NOT DETECTED Final   Proteus species NOT DETECTED NOT DETECTED Final   Serratia marcescens NOT DETECTED NOT DETECTED Final   Haemophilus influenzae NOT DETECTED NOT DETECTED Final   Neisseria meningitidis NOT DETECTED NOT DETECTED Final   Pseudomonas aeruginosa NOT DETECTED NOT DETECTED Final   Candida albicans NOT DETECTED NOT DETECTED Final   Candida glabrata NOT DETECTED NOT DETECTED Final   Candida krusei NOT DETECTED NOT DETECTED Final   Candida parapsilosis NOT DETECTED NOT DETECTED Final   Candida tropicalis NOT DETECTED NOT DETECTED Final    Comment: Performed at [facility], [place] [address] [place], [place] [address]  SARS Coronavirus 2 by RT PCR (hospital order, performed in [facility] hospital lab) Nasopharyngeal Nasopharyngeal Swab     Status: None   Collection Time: 01/22/20 12:10 PM   Specimen: Nasopharyngeal Swab  Result Value Ref Range Status   SARS Coronavirus 2 NEGATIVE  NEGATIVE Final    Comment: (NOTE) SARS-CoV-2 target nucleic acids are NOT DETECTED. The SARS-CoV-2 RNA is generally detectable in upper and lower respiratory specimens during the acute phase of infection. The lowest concentration of SARS-CoV-2 viral copies this assay can detect is 250 copies / mL. A negative result does not preclude SARS-CoV-2 infection and should not be used as the  sole basis for treatment or other patient management decisions.  A negative result may occur with improper specimen collection / handling, submission of specimen other than nasopharyngeal swab, presence of viral mutation(s) within the areas targeted by this assay, and inadequate number of viral copies (<250 copies / mL). A negative result must be combined with clinical observations, patient history, and epidemiological information. Fact Sheet for Patients:   StrictlyIdeas.no Fact Sheet for Healthcare Providers: BankingDealers.co.za This test is not yet approved or cleared  by the [place] FDA and has been authorized for detection and/or diagnosis of SARS-CoV-2 by FDA under an Emergency Use Authorization (EUA).  This EUA will remain in effect (meaning this test can be used) for the duration of the COVID-19 declaration under Section 564(b)(1) of the Act, 21 U.S.C. section 360bbb-3(b)(1), unless the authorization is terminated or revoked sooner. Performed at [facility], [place] [address], [place], [place] [address]   Culture, blood (routine x 2)     Status: None (Preliminary result)   Collection Time: 01/23/20 10:25 AM   Specimen: BLOOD  Result Value Ref Range Status   Specimen Description   Final    BLOOD RIGHT ARM Performed at [facility] [address], [place], [place] [address]    Special Requests   Final    BOTTLES DRAWN AEROBIC AND ANAEROBIC Blood Culture adequate volume Performed at [facility] [address], [place], [place] [address]    Culture   Final    NO GROWTH 3 DAYS Performed at [facility], [place] [address], [place], [place] [address]    Report Status PENDING  Incomplete  Culture, blood (routine x 2)     Status: None (Preliminary result)   Collection Time: 01/23/20 10:35 AM   Specimen: BLOOD RIGHT HAND  Result Value Ref Range Status   Specimen Description   Final    BLOOD RIGHT HAND Performed at [facility] [address], [place], [place] [address]    Special Requests   Final    BOTTLES DRAWN AEROBIC AND ANAEROBIC Blood Culture adequate volume Performed at [facility] [address] [place], [place] [address]    Culture   Final    NO GROWTH 3 DAYS Performed at [facility], [place] [address] [place], [place] [address]    Report Status PENDING  Incomplete         Radiology Studies: No results found.      Scheduled Meds: . Chlorhexidine Gluconate Cloth  6 each Topical Daily  . heparin  5,000 Units Subcutaneous Q8H   Continuous Infusions:    Time spent: 20 minutes with over 50% of the time coordinating the patient's care    Harold Hedge, DO [facility] Pager [phone]  Call night coverage person covering after 7pm

## 2020-01-27 LAB — BASIC METABOLIC PANEL
Anion gap: 10 (ref 5–15)
BUN: 52 mg/dL — ABNORMAL HIGH (ref 8–23)
CO2: 28 mmol/L (ref 22–32)
Calcium: 8.4 mg/dL — ABNORMAL LOW (ref 8.9–10.3)
Chloride: 92 mmol/L — ABNORMAL LOW (ref 98–111)
Creatinine, Ser: 2.33 mg/dL — ABNORMAL HIGH (ref 0.44–1.00)
GFR calc Af Amer: 25 mL/min — ABNORMAL LOW (ref >60)
GFR calc non Af Amer: 21 mL/min — ABNORMAL LOW (ref >60)
Glucose, Bld: 94 mg/dL (ref 70–99)
Potassium: 4.3 mmol/L (ref 3.5–5.1)
Sodium: 130 mmol/L — ABNORMAL LOW (ref 135–145)

## 2020-01-27 LAB — CBC
HCT: 22 % — ABNORMAL LOW (ref 36.0–46.0)
Hemoglobin: 7.4 g/dL — ABNORMAL LOW (ref 12.0–15.0)
MCH: 32.6 pg (ref 26.0–34.0)
MCHC: 33.6 g/dL (ref 30.0–36.0)
MCV: 96.9 fL (ref 80.0–100.0)
Platelets: 239 10*3/uL (ref 150–400)
RBC: 2.27 MIL/uL — ABNORMAL LOW (ref 3.87–5.11)
RDW: 13.1 % (ref 11.5–15.5)
WBC: 9.9 10*3/uL (ref 4.0–10.5)
nRBC: 0 % (ref 0.0–0.2)

## 2020-01-27 LAB — RENAL FUNCTION PANEL
Albumin: 2.6 g/dL — ABNORMAL LOW (ref 3.5–5.0)
Anion gap: 10 (ref 5–15)
BUN: 55 mg/dL — ABNORMAL HIGH (ref 8–23)
CO2: 29 mmol/L (ref 22–32)
Calcium: 8.5 mg/dL — ABNORMAL LOW (ref 8.9–10.3)
Chloride: 93 mmol/L — ABNORMAL LOW (ref 98–111)
Creatinine, Ser: 2.19 mg/dL — ABNORMAL HIGH (ref 0.44–1.00)
GFR calc Af Amer: 27 mL/min — ABNORMAL LOW (ref >60)
GFR calc non Af Amer: 23 mL/min — ABNORMAL LOW (ref >60)
Glucose, Bld: 92 mg/dL (ref 70–99)
Phosphorus: 3.9 mg/dL (ref 2.5–4.6)
Potassium: 4.3 mmol/L (ref 3.5–5.1)
Sodium: 132 mmol/L — ABNORMAL LOW (ref 135–145)

## 2020-01-27 NOTE — Progress Notes (Signed)
PROGRESS NOTE    [name]    Code Status: DNR  ZOX:[id number] DOB: August 21, 1956 DOA: 01/22/2020 LOS: 5 days  PCP: [name], MD CC:  Chief Complaint  Patient presents with  . AKI       Hospital Summary   This is a 64 year old female with history of left breast cancer s/p mastectomy, diverticulitis, hypertension, CKD 3b who presented to the ED from cancer center prior to receiving chemo after she was found to have low blood pressure.  She was given IV fluids which helped her BP but labs showed significantly elevated creatinine and diagnosed with AKI on CKD.  Nephrology was consulted.  She was noted to be on multiple nephrotoxic agents including Benzapril, HCTZ, NSAIDs and also Lasix was stopped by a physician about a week prior to admission.  ED Course: Upon arrival to ED, her blood pressure was fairly normal at 112/71.  There is one reading of 41/32 that is an error.  Patient is asymptomatic.  BMP was repeated which showed creatinine of 6.02 with sodium of 119.  EDP already consulted nephrology and then called hospital service for admission.  5/26: Blood culture positive for Enterococcus and started on ampicillin.  ID consulted  5/28: Ampicillin discontinued as bacteremia thought to be a contaminant.  A & P   Active Problems:   Essential hypertension   S/P left mastectomy   Acute renal failure (ARF) (HCC)   Acute hyponatremia   1. AKI on CKD 3b a. Multifactorial due to nephrotoxic agents and likely volume depletion b. Improved after lasix and sodium bicarbonate, now off lasix and bicarb c. nephro on board: Continues to show improvement but not at baseline  2. Asymptomatic Hyponatremia a. improvement to 130 b. Appreciate nephrology's assistance with this c. Pt/ot  3. Enterococcus positive blood culture in setting of a Port-A-Cath, likely contaminant a. Started on ampicillin on 5/26->5/28, doing well off antibiotics b. ID consulted: No indication for Port-A-Cath  removal and no indication to continue antibiotics. okay from ID standpoint to resume chemotherapy as indicated if blood cultures remain negative c. Echo 5/25 without vegetations noted  4. Hypertension/hypotensive episode a. Stable off antihypertensives  5. Normocytic anemia a. Hb 7.0 -> 7.4 b. consider transfusion if <7.0  6. Metabolic acidosis resolved with sodium bicarb  7. Breast cancer a. Sent in from cancer center prior to starting her chemotherapy b. Follow-up with outpatient oncology  DVT prophylaxis: Heparin Family Communication: Patient updated at bedside Disposition Plan:  Status is: Inpatient  Remains inpatient appropriate because:Nephrology sign off needs continued improvement of sodium and renal function  Dispo: The patient is from: Home              Anticipated d/c is to: Home              Anticipated d/c date is: 1 day              Patient currently is not medically stable to d/c.           Pressure injury documentation   Pressure Injury 12/19/19 Tibial Distal;Left;Posterior Stage 2 -  Partial thickness loss of dermis presenting as a shallow open injury with a red, pink wound bed without slough. Pink, red (Active)  12/19/19 1903  Location: Tibial  Location Orientation: Distal;Left;Posterior  Staging: Stage 2 -  Partial thickness loss of dermis presenting as a shallow open injury with a red, pink wound bed without slough.  Wound Description (Comments): Pink, red  Present on Admission:  Yes     Pressure Injury 12/19/19 Sacrum Medial Stage 2 -  Partial thickness loss of dermis presenting as a shallow open injury with a red, pink wound bed without slough. Pink, red (Active)  12/19/19 1904  Location: Sacrum  Location Orientation: Medial  Staging: Stage 2 -  Partial thickness loss of dermis presenting as a shallow open injury with a red, pink wound bed without slough.  Wound Description (Comments): Pink, red  Present on Admission: Yes    None  Consultants  Nephrology   Procedures  None  Antibiotics   Anti-infectives (From admission, onward)   Start     Dose/Rate Route Frequency Ordered Stop   01/23/20 1000  ampicillin (OMNIPEN) 2 g in sodium chloride 0.9 % 100 mL IVPB  Status:  Discontinued     2 g 300 mL/hr over 20 Minutes Intravenous Every 8 hours 01/23/20 0924 01/25/20 0951        Subjective   Patient seen and examined at bedside no acute distress and resting comfortably.  No events overnight.  Tolerating diet.  Denies any chest pain, shortness of breath, fever, nausea, vomiting, urinary complaints.  Admits to having bowel movement.  Otherwise ROS negative   Objective   Vitals:   01/26/20 1539 01/26/20 2108 01/27/20 0607 01/27/20 1327  BP: 133/68 (!) 117/49 103/61 114/66  Pulse: (!) 101 (!) 107 93 96  Resp: 16 17 18 15   Temp: 98.1 F (36.7 C) 98.8 F (37.1 C) 97.9 F (36.6 C) 97.7 F (36.5 C)  TempSrc: Oral Oral Oral Oral  SpO2: 100% 96% 98% 100%  Weight:   (!) 142.7 kg   Height:        Intake/Output Summary (Last 24 hours) at 01/27/2020 1647 Last data filed at 01/27/2020 1500 Gross per 24 hour  Intake 480 ml  Output 950 ml  Net -470 ml   Filed Weights   01/22/20 2327 01/27/20 0607  Weight: (!) 137 kg (!) 142.7 kg    Examination:  Physical Exam Vitals and nursing note reviewed.  Constitutional:      Appearance: Normal appearance.  HENT:     Head: Normocephalic and atraumatic.  Eyes:     Conjunctiva/sclera: Conjunctivae normal.  Cardiovascular:     Rate and Rhythm: Normal rate and regular rhythm.     Comments: 2/6 systolic murmur Pulmonary:     Effort: Pulmonary effort is normal.     Breath sounds: Normal breath sounds.  Abdominal:     General: Abdomen is flat.     Palpations: Abdomen is soft.  Musculoskeletal:        General: No swelling or tenderness.  Skin:    Coloration: Skin is not jaundiced or pale.  Neurological:     Mental Status: She is alert. Mental status is  at baseline.  Psychiatric:        Mood and Affect: Mood normal.        Behavior: Behavior normal.     Data Reviewed: I have personally reviewed following labs and imaging studies  CBC: Recent Labs  Lab 01/22/20 0840 01/22/20 1400 01/24/20 0500 01/25/20 0500 01/26/20 0500 01/26/20 1351 01/27/20 0500  WBC 6.3   < > 7.2 7.6 8.7 9.5 9.9  NEUTROABS 2.8  --   --   --   --   --   --   HGB 8.4*   < > 9.0* 7.6* 7.0* 7.7* 7.4*  HCT 23.8*   < > 25.9* 21.8* 20.5* 22.1* 22.0*  MCV  92.2   < > 93.2 94.8 94.9 95.7 96.9  PLT 210   < > 188 213 209 253 239   < > = values in this interval not displayed.   Basic Metabolic Panel: Recent Labs  Lab 01/22/20 0840 01/22/20 1400 01/23/20 0531 01/23/20 0531 01/24/20 0500 01/24/20 1542 01/24/20 1859 01/24/20 2254 01/25/20 0500 01/26/20 0500 01/27/20 0500  NA   < >  --  120*   < > 118*   < > 122* 124* 125*  123* 125*  125* 130*  132*  K   < >  --  5.0   < > 4.6   < > 4.1 4.0 4.2  4.0 4.2  4.2 4.3  4.3  CL   < >  --  87*   < > 80*   < > 83* 85* 85*  85* 87*  88* 92*  93*  CO2   < >  --  21*   < > 26   < > 26 26 26  27 29  28 28  29   GLUCOSE   < >  --  101*   < > 93   < > 99 123* 104*  104* 103*  101* 94  92  BUN   < >  --  80*   < > 71*   < > 72* 67* 65*  67* 63*  64* 52*  55*  CREATININE   < > 5.76* 4.36*   < > 4.04*   < > 3.35* 3.34* 3.20*  3.14* 2.55*  2.65* 2.33*  2.19*  CALCIUM   < >  --  8.0*   < > 7.3*   < > 8.0* 7.9* 8.1*  8.2* 8.3*  8.2* 8.4*  8.5*  MG  --  1.3*  --   --   --   --   --   --   --   --   --   PHOS  --   --  4.6  --  4.1  --   --   --  4.4 3.9 3.9   < > = values in this interval not displayed.   GFR: Estimated Creatinine Clearance: 35.2 mL/min (A) (by C-G formula based on SCr of 2.19 mg/dL (H)). Liver Function Tests: Recent Labs  Lab 01/22/20 0840 01/22/20 0840 01/23/20 0531 01/24/20 0500 01/25/20 0500 01/26/20 0500 01/27/20 0500  AST 35  --  43*  --   --   --   --   ALT 34  --  33  --    --   --   --   ALKPHOS 62  --  49  --   --   --   --   BILITOT 0.2*  --  0.3  --   --   --   --   PROT 7.2  --  6.4*  --   --   --   --   ALBUMIN 2.7*   < > 2.7* 2.6* 2.5* 2.5* 2.6*   < > = values in this interval not displayed.   No results for input(s): LIPASE, AMYLASE in the last 168 hours. No results for input(s): AMMONIA in the last 168 hours. Coagulation Profile: No results for input(s): INR, PROTIME in the last 168 hours. Cardiac Enzymes: No results for input(s): CKTOTAL, CKMB, CKMBINDEX, TROPONINI in the last 168 hours. BNP (last 3 results) No results for input(s): PROBNP in the last 8760 hours. HbA1C: No results for input(s):  HGBA1C in the last 72 hours. CBG: No results for input(s): GLUCAP in the last 168 hours. Lipid Profile: No results for input(s): CHOL, HDL, LDLCALC, TRIG, CHOLHDL, LDLDIRECT in the last 72 hours. Thyroid Function Tests: No results for input(s): TSH, T4TOTAL, FREET4, T3FREE, THYROIDAB in the last 72 hours. Anemia Panel: No results for input(s): VITAMINB12, FOLATE, FERRITIN, TIBC, IRON, RETICCTPCT in the last 72 hours. Sepsis Labs: Recent Labs  Lab 01/22/20 1209  LATICACIDVEN 1.5    Recent Results (from the past 240 hour(s))  Culture, blood (Routine X 2) w Reflex to ID Panel     Status: Abnormal   Collection Time: 01/22/20 12:09 PM   Specimen: BLOOD  Result Value Ref Range Status   Specimen Description   Final    BLOOD PORTA CATH Performed at [facility], [place] [address] [place], [place] [address]    Special Requests   Final    BOTTLES DRAWN AEROBIC AND ANAEROBIC Blood Culture adequate volume Performed at [facility] [address], [place], [place] [address]    Culture  Setup Time   Final    GRAM POSITIVE COCCI IN PAIRS IN CHAINS IN BOTH AEROBIC AND ANAEROBIC BOTTLES Organism ID to follow CRITICAL RESULT CALLED TO, READ BACK BY AND VERIFIED WITH: Karie Kirks PharmD 9:00 01/23/20 ([email or web address]) Performed at [facility], [place] [address], [place],  [address]    Culture ENTEROCOCCUS GALLINARUM (A)  Final   Report Status 01/25/2020 FINAL  Final   Organism ID, Bacteria ENTEROCOCCUS GALLINARUM  Final      Susceptibility   Enterococcus gallinarum - MIC*    AMPICILLIN <=2 SENSITIVE Sensitive     VANCOMYCIN RESISTANT Resistant     GENTAMICIN SYNERGY SENSITIVE Sensitive     LINEZOLID 1 SENSITIVE Sensitive     * ENTEROCOCCUS GALLINARUM  Blood Culture ID Panel (Reflexed)     Status: Abnormal   Collection Time: 01/22/20 12:09 PM  Result Value Ref Range Status   Enterococcus species DETECTED (A) NOT DETECTED Final    Comment: CRITICAL RESULT CALLED TO, READ BACK BY AND VERIFIED WITH: Karie Kirks PharmD 9:00 01/23/20 ([email or web address])    Vancomycin resistance NOT DETECTED NOT DETECTED Final   Listeria monocytogenes NOT DETECTED NOT DETECTED Final   Staphylococcus species NOT DETECTED NOT DETECTED Final   Staphylococcus aureus (BCID) NOT DETECTED NOT DETECTED Final   Streptococcus species NOT DETECTED NOT DETECTED Final   Streptococcus agalactiae NOT DETECTED NOT DETECTED Final   Streptococcus pneumoniae NOT DETECTED NOT DETECTED Final   Streptococcus pyogenes NOT DETECTED NOT DETECTED Final   Acinetobacter baumannii NOT DETECTED NOT DETECTED Final   Enterobacteriaceae species NOT DETECTED NOT DETECTED Final   Enterobacter cloacae complex NOT DETECTED NOT DETECTED Final   Escherichia coli NOT DETECTED NOT DETECTED Final   Klebsiella oxytoca NOT DETECTED NOT DETECTED Final   Klebsiella pneumoniae NOT DETECTED NOT DETECTED Final   Proteus species NOT DETECTED NOT DETECTED Final   Serratia marcescens NOT DETECTED NOT DETECTED Final   Haemophilus influenzae NOT DETECTED NOT DETECTED Final   Neisseria meningitidis NOT DETECTED NOT DETECTED Final   Pseudomonas aeruginosa NOT DETECTED NOT DETECTED Final   Candida albicans NOT DETECTED NOT DETECTED Final   Candida glabrata NOT DETECTED NOT DETECTED Final   Candida  krusei NOT DETECTED NOT DETECTED Final   Candida parapsilosis NOT DETECTED NOT DETECTED Final   Candida tropicalis NOT DETECTED NOT DETECTED Final    Comment: Performed at [facility], [address], [place], [place] [address]  SARS Coronavirus 2 by RT PCR (hospital order, performed in [facility] hospital lab) Nasopharyngeal Nasopharyngeal Swab     Status: None   Collection Time: 01/22/20 12:10 PM   Specimen: Nasopharyngeal Swab  Result Value Ref Range Status   SARS Coronavirus 2 NEGATIVE NEGATIVE Final    Comment: (NOTE) SARS-CoV-2 target nucleic acids are NOT DETECTED. The SARS-CoV-2 RNA is generally detectable in upper and lower respiratory specimens during the acute phase of infection. The lowest concentration of SARS-CoV-2 viral copies this assay can detect is 250 copies / mL. A negative result does not preclude SARS-CoV-2 infection and should not be used as the sole basis for treatment or other patient management decisions.  A negative result may occur with improper specimen collection / handling, submission of specimen other than nasopharyngeal swab, presence of viral mutation(s) within the areas targeted by this assay, and inadequate number of viral copies (<250 copies / mL). A negative result must be combined with clinical observations, patient history, and epidemiological information. Fact Sheet for Patients:   StrictlyIdeas.no Fact Sheet for Healthcare Providers: BankingDealers.co.za This test is not yet approved or cleared  by the [place] FDA and has been authorized for detection and/or diagnosis of SARS-CoV-2 by FDA under an Emergency Use Authorization (EUA).  This EUA will remain in effect (meaning this test can be used) for the duration of the COVID-19 declaration under Section 564(b)(1) of the Act, 21 U.S.C. section 360bbb-3(b)(1), unless the authorization is terminated or revoked sooner. Performed at [facility], [place] [address], [place], [place] [address]   Culture, blood (routine x 2)     Status: None (Preliminary result)   Collection Time: 01/23/20 10:25 AM   Specimen: BLOOD  Result Value Ref Range Status   Specimen Description   Final    BLOOD RIGHT ARM Performed at [facility] [address], [place], [place] [address]    Special Requests   Final    BOTTLES DRAWN AEROBIC AND ANAEROBIC Blood Culture adequate volume Performed at [facility] [address], [place], [place] [address]    Culture   Final    NO GROWTH 4 DAYS Performed at [facility], [place] [address], [place], [place] [address]    Report Status PENDING  Incomplete  Culture, blood (routine x 2)     Status: None (Preliminary result)   Collection Time: 01/23/20 10:35 AM   Specimen: BLOOD RIGHT HAND  Result Value Ref Range Status   Specimen Description   Final    BLOOD RIGHT HAND Performed at [facility] [address] [place],  [address]    Special Requests   Final    BOTTLES DRAWN AEROBIC AND ANAEROBIC Blood Culture adequate volume Performed at [facility] [address], [place],  [address]    Culture   Final    NO GROWTH 4 DAYS Performed at [facility], [place] [address] [place],  [address]    Report Status PENDING  Incomplete         Radiology Studies: No results found.      Scheduled Meds: . Chlorhexidine Gluconate Cloth  6 each Topical Daily  . heparin  5,000 Units Subcutaneous Q8H   Continuous Infusions:    Time spent: 15 minutes with over 50% of the time coordinating the patient's care    Harold Hedge, DO [facility] Pager  [phone]  Call night coverage person covering after 7pm

## 2020-01-27 NOTE — Progress Notes (Signed)
[facility] ROUNDING NOTE   Subjective:   64 year old lady history of left breast cancer status post mastectomy April 2021.  Baseline serum creatinine appears fairly variable but has ranged anywhere from 1.26 mg/dL to about 2.4 mg/dL.  She is due to start chemotherapy in the morning of Jan 22, 2020 routine labs showed a serum creatinine of 6 mg/dL the blood pressure 80 mmHg.  She was taking ACE inhibitor Bays nasal pill, the diuretic Lasix and hydrochlorothiazide and also combination nonsteroidal inflammatories and Cox 2 inhibitors.  Pressure 103/61 pulse 93 temperature 97.9 O2 sats 98% room air  2D echo no pericardial effusion  Urine osmolality 208 urine sodium 19  Appears to have good urine output 1000 cc 01/26/2020  IV Omnipen 2 g every 8 hours  Sodium 132 potassium 4.3 chloride 93 CO2 29 BUN 55 creatinine 2.19 calcium 8.5 phosphorus 3.9 albumin 2.6.  Objective:  Vital signs in last 24 hours:  Temp:  [97.9 F (36.6 C)-98.8 F (37.1 C)] 97.9 F (36.6 C) (05/30 0607) Pulse Rate:  [93-107] 93 (05/30 0607) Resp:  [16-18] 18 (05/30 0607) BP: (103-133)/(49-68) 103/61 (05/30 0607) SpO2:  [96 %-100 %] 98 % (05/30 0607) Weight:  [142.7 kg] 142.7 kg (05/30 0607)  Weight change:  Filed Weights   01/22/20 2327 01/27/20 0607  Weight: (!) 137 kg (!) 142.7 kg    Intake/Output: I/O last 3 completed shifts: In: -  Out: 1950 [Urine:1950]   Intake/Output this shift:  No intake/output data recorded.  General: Calm nondistressed HEENT: None traumatic external appearance normal oropharynx clear Eyes: Pupils were round equal reactive Neck: Supple no masses or thyromegaly Heart: Regular rate and rhythm no murmurs rubs or gallops chronic bilateral leg edema with 2+ pedal pulses. Lungs: Clear to auscultation bilaterally no wheezes rales or crackles Abdomen: Soft nontender bowel sounds are present Extremities: Chronic lower extremity lymphedema Skin: No skin rashes no  itching Neuro: Cranial nerves intact sensation strength regular 5/5   Basic Metabolic Panel: Recent Labs  Lab 01/22/20 0840 01/22/20 1400 01/23/20 0531 01/23/20 0531 01/24/20 0500 01/24/20 1542 01/24/20 1859 01/24/20 1859 01/24/20 2254 01/24/20 2254 01/25/20 0500 01/26/20 0500 01/27/20 0500  NA   < >  --  120*   < > 118*   < > 122*  --  124*  --  125*  123* 125*  125* 132*  K   < >  --  5.0   < > 4.6   < > 4.1  --  4.0  --  4.2  4.0 4.2  4.2 4.3  CL   < >  --  87*   < > 80*   < > 83*  --  85*  --  85*  85* 87*  88* 93*  CO2   < >  --  21*   < > 26   < > 26  --  26  --  26  27 29  28 29   GLUCOSE   < >  --  101*   < > 93   < > 99  --  123*  --  104*  104* 103*  101* 92  BUN   < >  --  80*   < > 71*   < > 72*  --  67*  --  65*  67* 63*  64* 55*  CREATININE   < > 5.76* 4.36*   < > 4.04*   < > 3.35*  --  3.34*  --  3.20*  3.14* 2.55*  2.65* 2.19*  CALCIUM   < >  --  8.0*   < > 7.3*   < > 8.0*   < > 7.9*   < > 8.1*  8.2* 8.3*  8.2* 8.5*  MG  --  1.3*  --   --   --   --   --   --   --   --   --   --   --   PHOS  --   --  4.6  --  4.1  --   --   --   --   --  4.4 3.9 3.9   < > = values in this interval not displayed.    Liver Function Tests: Recent Labs  Lab 01/22/20 0840 01/22/20 0840 01/23/20 0531 01/24/20 0500 01/25/20 0500 01/26/20 0500 01/27/20 0500  AST 35  --  43*  --   --   --   --   ALT 34  --  33  --   --   --   --   ALKPHOS 62  --  49  --   --   --   --   BILITOT 0.2*  --  0.3  --   --   --   --   PROT 7.2  --  6.4*  --   --   --   --   ALBUMIN 2.7*   < > 2.7* 2.6* 2.5* 2.5* 2.6*   < > = values in this interval not displayed.   No results for input(s): LIPASE, AMYLASE in the last 168 hours. No results for input(s): AMMONIA in the last 168 hours.  CBC: Recent Labs  Lab 01/22/20 0840 01/22/20 1400 01/24/20 0500 01/25/20 0500 01/26/20 0500 01/26/20 1351 01/27/20 0500  WBC 6.3   < > 7.2 7.6 8.7 9.5 9.9  NEUTROABS 2.8  --   --   --   --    --   --   HGB 8.4*   < > 9.0* 7.6* 7.0* 7.7* 7.4*  HCT 23.8*   < > 25.9* 21.8* 20.5* 22.1* 22.0*  MCV 92.2   < > 93.2 94.8 94.9 95.7 96.9  PLT 210   < > 188 213 209 253 239   < > = values in this interval not displayed.    Cardiac Enzymes: No results for input(s): CKTOTAL, CKMB, CKMBINDEX, TROPONINI in the last 168 hours.  BNP: Invalid input(s): POCBNP  CBG: No results for input(s): GLUCAP in the last 168 hours.  Microbiology: Results for orders placed or performed during the hospital encounter of 01/22/20  Culture, blood (Routine X 2) w Reflex to ID Panel     Status: Abnormal   Collection Time: 01/22/20 12:09 PM   Specimen: BLOOD  Result Value Ref Range Status   Specimen Description   Final    BLOOD PORTA CATH Performed at [facility], [place] [address], [place], [place] [address]    Special Requests   Final    BOTTLES DRAWN AEROBIC AND ANAEROBIC Blood Culture adequate volume Performed at [facility] [address], [place], [place] [address]    Culture  Setup Time   Final    GRAM POSITIVE COCCI IN PAIRS IN CHAINS IN BOTH AEROBIC AND ANAEROBIC BOTTLES Organism ID to follow CRITICAL RESULT CALLED TO, READ BACK BY AND VERIFIED WITH: Karie Kirks PharmD 9:00 01/23/20 ([email or web address]) Performed at [facility], [place] [address]  [place], [place] [address]    Culture ENTEROCOCCUS GALLINARUM (A)  Final   Report Status 01/25/2020 FINAL  Final   Organism ID, Bacteria ENTEROCOCCUS GALLINARUM  Final      Susceptibility   Enterococcus gallinarum - MIC*    AMPICILLIN <=2 SENSITIVE Sensitive     VANCOMYCIN RESISTANT Resistant     GENTAMICIN SYNERGY SENSITIVE Sensitive     LINEZOLID 1 SENSITIVE Sensitive     * ENTEROCOCCUS GALLINARUM  Blood Culture ID Panel (Reflexed)     Status: Abnormal   Collection Time: 01/22/20 12:09 PM  Result Value Ref Range Status   Enterococcus species DETECTED (A) NOT DETECTED Final    Comment: CRITICAL RESULT CALLED TO, READ BACK BY  AND VERIFIED WITH: Karie Kirks PharmD 9:00 01/23/20 ([email or web address])    Vancomycin resistance NOT DETECTED NOT DETECTED Final   Listeria monocytogenes NOT DETECTED NOT DETECTED Final   Staphylococcus species NOT DETECTED NOT DETECTED Final   Staphylococcus aureus (BCID) NOT DETECTED NOT DETECTED Final   Streptococcus species NOT DETECTED NOT DETECTED Final   Streptococcus agalactiae NOT DETECTED NOT DETECTED Final   Streptococcus pneumoniae NOT DETECTED NOT DETECTED Final   Streptococcus pyogenes NOT DETECTED NOT DETECTED Final   Acinetobacter baumannii NOT DETECTED NOT DETECTED Final   Enterobacteriaceae species NOT DETECTED NOT DETECTED Final   Enterobacter cloacae complex NOT DETECTED NOT DETECTED Final   Escherichia coli NOT DETECTED NOT DETECTED Final   Klebsiella oxytoca NOT DETECTED NOT DETECTED Final   Klebsiella pneumoniae NOT DETECTED NOT DETECTED Final   Proteus species NOT DETECTED NOT DETECTED Final   Serratia marcescens NOT DETECTED NOT DETECTED Final   Haemophilus influenzae NOT DETECTED NOT DETECTED Final   Neisseria meningitidis NOT DETECTED NOT DETECTED Final   Pseudomonas aeruginosa NOT DETECTED NOT DETECTED Final   Candida albicans NOT DETECTED NOT DETECTED Final   Candida glabrata NOT DETECTED NOT DETECTED Final   Candida krusei NOT DETECTED NOT DETECTED Final   Candida parapsilosis NOT DETECTED NOT DETECTED Final   Candida tropicalis NOT DETECTED NOT DETECTED Final    Comment: Performed at [facility], [address] [place], [place] [address]  SARS Coronavirus 2 by RT PCR (hospital order, performed in [facility] hospital lab) Nasopharyngeal Nasopharyngeal Swab     Status: None   Collection Time: 01/22/20 12:10 PM   Specimen: Nasopharyngeal Swab  Result Value Ref Range Status   SARS Coronavirus 2 NEGATIVE NEGATIVE Final    Comment: (NOTE) SARS-CoV-2 target nucleic acids are NOT DETECTED. The SARS-CoV-2 RNA is generally detectable in upper and lower respiratory  specimens during the acute phase of infection. The lowest concentration of SARS-CoV-2 viral copies this assay can detect is 250 copies / mL. A negative result does not preclude SARS-CoV-2 infection and should not be used as the sole basis for treatment or other patient management decisions.  A negative result may occur with improper specimen collection / handling, submission of specimen other than nasopharyngeal swab, presence of viral mutation(s) within the areas targeted by this assay, and inadequate number of viral copies (<250 copies / mL). A negative result must be combined with clinical observations, patient history, and epidemiological information. Fact Sheet for Patients:   StrictlyIdeas.no Fact Sheet for Healthcare Providers: BankingDealers.co.za This test is not yet approved or cleared  by the [place] FDA and has been authorized for detection and/or diagnosis of SARS-CoV-2 by FDA under an Emergency Use Authorization (EUA).  This EUA will remain in effect (meaning this test can  be used) for the duration of the COVID-19 declaration under Section 564(b)(1) of the Act, 21 U.S.C. section 360bbb-3(b)(1), unless the authorization is terminated or revoked sooner. Performed at [facility], [place] [address] [place], [place] [address]   Culture, blood (routine x 2)     Status: None (Preliminary result)   Collection Time: 01/23/20 10:25 AM   Specimen: BLOOD  Result Value Ref Range Status   Specimen Description   Final    BLOOD RIGHT ARM Performed at [facility] [address] [place], [place] [address]    Special Requests   Final    BOTTLES DRAWN AEROBIC AND ANAEROBIC Blood Culture adequate volume Performed at [place] [address] [place] [address], [place], [place] [address]    Culture   Final    NO GROWTH 4 DAYS Performed at [facility], [place] [address] [place],  [place] [address]    Report Status PENDING  Incomplete  Culture, blood (routine x 2)     Status: None (Preliminary result)   Collection Time: 01/23/20 10:35 AM   Specimen: BLOOD RIGHT HAND  Result Value Ref Range Status   Specimen Description   Final    BLOOD RIGHT HAND Performed at [facility] [address] [place], [place] [address]    Special Requests   Final    BOTTLES DRAWN AEROBIC AND ANAEROBIC Blood Culture adequate volume Performed at [facility] [address], [place], [place] [address]    Culture   Final    NO GROWTH 4 DAYS Performed at [facility], [place] [address], [place], [place] [address]    Report Status PENDING  Incomplete    Coagulation Studies: No results for input(s): LABPROT, INR in the last 72 hours.  Urinalysis: No results for input(s): COLORURINE, LABSPEC, PHURINE, GLUCOSEU, HGBUR, BILIRUBINUR, KETONESUR, PROTEINUR, UROBILINOGEN, NITRITE, LEUKOCYTESUR in the last 72 hours.  Invalid input(s): APPERANCEUR    Imaging: No results found.   Medications:    . Chlorhexidine Gluconate Cloth  6 each Topical Daily  . heparin  5,000 Units Subcutaneous Q8H   acetaminophen **OR** acetaminophen, HYDROcodone-acetaminophen, ondansetron **OR** ondansetron (ZOFRAN) IV  Assessment/ Plan:  1.Renal-acute kidney injury this appears to be in the setting of some chronic kidney damage.  Her renal function with baseline does not appear completely normal and she may indeed have stage III chronic kidney disease.  Her baseline creatinine seems to vary considerably.  This obviously is a change with a creatinine that is increased to above 6 mg/dL.  Noted that she is hypotensive and does take ACE inhibitors, nonsteroidal anti-inflammatory drugs, Cox 2 inhibitors, diuretics in one form or other.  Urinalysis was bland although did show cysts 6-10 WBCs per high-power field with many bacteria.  Renal ultrasound did not reveal any evidence of  hydronephrosis  2D echo does not reveal any pericardial effusion.  EF 65%.  Cortisol 11.8.  Her renal function continues to show improvement but not at baseline. 2. Hypertension/volume  -appears to have some volume depletion.  Stopped antihypertensive medications and cautiously hydrate.   Antihypertensives discontinued 3.  Metabolic acidosis.  Resolved stopping sodium bicarbonate 4.  Breast cancer-chemotherapy on hold until stabilization of renal function. 5.   Hyponatremia seems to be improved with improving urine output have discontinued Lasix 6.  Enterococcus positive blood culture appreciate assistance from Dr Linus Salmons.  Patient started on ampicillin 01/23/2020.  Patient does have Port-A-Cath    LOS: 5  Sherril Croon @TODAY @8 :46 AM

## 2020-01-28 LAB — BASIC METABOLIC PANEL
Anion gap: 9 (ref 5–15)
BUN: 45 mg/dL — ABNORMAL HIGH (ref 8–23)
CO2: 29 mmol/L (ref 22–32)
Calcium: 8.4 mg/dL — ABNORMAL LOW (ref 8.9–10.3)
Chloride: 94 mmol/L — ABNORMAL LOW (ref 98–111)
Creatinine, Ser: 1.95 mg/dL — ABNORMAL HIGH (ref 0.44–1.00)
GFR calc Af Amer: 31 mL/min — ABNORMAL LOW (ref >60)
GFR calc non Af Amer: 27 mL/min — ABNORMAL LOW (ref >60)
Glucose, Bld: 95 mg/dL (ref 70–99)
Potassium: 4.6 mmol/L (ref 3.5–5.1)
Sodium: 132 mmol/L — ABNORMAL LOW (ref 135–145)

## 2020-01-28 LAB — CULTURE, BLOOD (ROUTINE X 2)
Culture: NO GROWTH
Culture: NO GROWTH
Special Requests: ADEQUATE
Special Requests: ADEQUATE

## 2020-01-28 LAB — CBC
HCT: 20.9 % — ABNORMAL LOW (ref 36.0–46.0)
Hemoglobin: 7.1 g/dL — ABNORMAL LOW (ref 12.0–15.0)
MCH: 32.9 pg (ref 26.0–34.0)
MCHC: 34 g/dL (ref 30.0–36.0)
MCV: 96.8 fL (ref 80.0–100.0)
Platelets: 215 10*3/uL (ref 150–400)
RBC: 2.16 MIL/uL — ABNORMAL LOW (ref 3.87–5.11)
RDW: 13.2 % (ref 11.5–15.5)
WBC: 9.2 10*3/uL (ref 4.0–10.5)
nRBC: 0 % (ref 0.0–0.2)

## 2020-01-28 LAB — RENAL FUNCTION PANEL
Albumin: 2.3 g/dL — ABNORMAL LOW (ref 3.5–5.0)
Anion gap: 7 (ref 5–15)
BUN: 45 mg/dL — ABNORMAL HIGH (ref 8–23)
CO2: 30 mmol/L (ref 22–32)
Calcium: 8.5 mg/dL — ABNORMAL LOW (ref 8.9–10.3)
Chloride: 95 mmol/L — ABNORMAL LOW (ref 98–111)
Creatinine, Ser: 1.98 mg/dL — ABNORMAL HIGH (ref 0.44–1.00)
GFR calc Af Amer: 30 mL/min — ABNORMAL LOW (ref >60)
GFR calc non Af Amer: 26 mL/min — ABNORMAL LOW (ref >60)
Glucose, Bld: 95 mg/dL (ref 70–99)
Phosphorus: 4.1 mg/dL (ref 2.5–4.6)
Potassium: 4.6 mmol/L (ref 3.5–5.1)
Sodium: 132 mmol/L — ABNORMAL LOW (ref 135–145)

## 2020-01-28 MED ORDER — HEPARIN SOD (PORK) LOCK FLUSH 100 UNIT/ML IV SOLN
500.0000 [IU] | Freq: Once | INTRAVENOUS | Status: AC
  Administered 2020-01-28: 500 [IU] via INTRAVENOUS
  Filled 2020-01-28: qty 5

## 2020-01-28 NOTE — Progress Notes (Signed)
[facility] ROUNDING NOTE   Subjective:   Summary:  64 year old lady history of left breast cancer status post mastectomy April 2021.  Baseline serum creatinine appears fairly variable but has ranged anywhere from 1.26 mg/dL to about 2.4 mg/dL.  She is due to start chemotherapy in the morning of Jan 22, 2020 routine labs showed a serum creatinine of 6 mg/dL the blood pressure 80 mmHg.  She was taking ACE inhibitor Bays nasal pill, the diuretic Lasix and hydrochlorothiazide and also combination nonsteroidal inflammatories and Cox 2 inhibitors.  Overnight - no new c/o's   Objective:  Vital signs in last 24 hours:  Temp:  [98.7 F (37.1 C)-99 F (37.2 C)] 98.7 F (37.1 C) (05/31 0556) Pulse Rate:  [97-100] 97 (05/31 0556) Resp:  [16-17] 17 (05/31 0556) BP: (105-124)/(56-68) 124/56 (05/31 0556) SpO2:  [97 %-100 %] 97 % (05/31 0556)  Weight change:  Filed Weights   01/22/20 2327 01/27/20 0607  Weight: (!) 137 kg (!) 142.7 kg    Intake/Output: I/O last 3 completed shifts: In: 62 [P.O.:960] Out: 2650 [Urine:2650]   Intake/Output this shift:  Total I/O In: 360 [P.O.:360] Out: -   General: Calm nondistressed HEENT: None traumatic external appearance normal oropharynx clear Eyes: Pupils were round equal reactive Neck: Supple no masses or thyromegaly Heart: Regular rate and rhythm no murmurs rubs or gallops chronic bilateral leg edema with 2+ pedal pulses. Lungs: Clear to auscultation bilaterally no wheezes rales or crackles Abdomen: Soft nontender bowel sounds are present Extremities: Chronic lower extremity lymphedema Skin: No skin rashes no itching Neuro: Cranial nerves intact sensation strength regular 5/5   Basic Metabolic Panel: Recent Labs  Lab 01/22/20 1400 01/23/20 0531 01/24/20 0500 01/24/20 1542 01/24/20 2254 01/24/20 2254 01/25/20 0500 01/25/20 0500 01/26/20 0500 01/27/20 0500 01/28/20 0554  NA  --    < > 118*   < > 124*  --  125*  123*   --  125*  125* 130*  132* 132*  132*  K  --    < > 4.6   < > 4.0  --  4.2  4.0  --  4.2  4.2 4.3  4.3 4.6  4.6  CL  --    < > 80*   < > 85*  --  85*  85*  --  87*  88* 92*  93* 95*  94*  CO2  --    < > 26   < > 26  --  26  27  --  29  28 28  29 30  29   GLUCOSE  --    < > 93   < > 123*  --  104*  104*  --  103*  101* 94  92 95  95  BUN  --    < > 71*   < > 67*  --  65*  67*  --  63*  64* 52*  55* 45*  45*  CREATININE 5.76*   < > 4.04*   < > 3.34*  --  3.20*  3.14*  --  2.55*  2.65* 2.33*  2.19* 1.98*  1.95*  CALCIUM  --    < > 7.3*   < > 7.9*   < > 8.1*  8.2*   < > 8.3*  8.2* 8.4*  8.5* 8.5*  8.4*  MG 1.3*  --   --   --   --   --   --   --   --   --   --  PHOS  --    < > 4.1  --   --   --  4.4  --  3.9 3.9 4.1   < > = values in this interval not displayed.    Liver Function Tests: Recent Labs  Lab 01/22/20 0840 01/22/20 0840 01/23/20 0531 01/23/20 0531 01/24/20 0500 01/25/20 0500 01/26/20 0500 01/27/20 0500 01/28/20 0554  AST 35  --  43*  --   --   --   --   --   --   ALT 34  --  33  --   --   --   --   --   --   ALKPHOS 62  --  49  --   --   --   --   --   --   BILITOT 0.2*  --  0.3  --   --   --   --   --   --   PROT 7.2  --  6.4*  --   --   --   --   --   --   ALBUMIN 2.7*   < > 2.7*   < > 2.6* 2.5* 2.5* 2.6* 2.3*   < > = values in this interval not displayed.   No results for input(s): LIPASE, AMYLASE in the last 168 hours. No results for input(s): AMMONIA in the last 168 hours.  CBC: Recent Labs  Lab 01/22/20 0840 01/22/20 1400 01/25/20 0500 01/26/20 0500 01/26/20 1351 01/27/20 0500 01/28/20 0554  WBC 6.3   < > 7.6 8.7 9.5 9.9 9.2  NEUTROABS 2.8  --   --   --   --   --   --   HGB 8.4*   < > 7.6* 7.0* 7.7* 7.4* 7.1*  HCT 23.8*   < > 21.8* 20.5* 22.1* 22.0* 20.9*  MCV 92.2   < > 94.8 94.9 95.7 96.9 96.8  PLT 210   < > 213 209 253 239 215   < > = values in this interval not displayed.    Cardiac Enzymes: No results for  input(s): CKTOTAL, CKMB, CKMBINDEX, TROPONINI in the last 168 hours.  BNP: Invalid input(s): POCBNP  CBG: No results for input(s): GLUCAP in the last 168 hours.  Microbiology: Results for orders placed or performed during the hospital encounter of 01/22/20  Culture, blood (Routine X 2) w Reflex to ID Panel     Status: Abnormal   Collection Time: 01/22/20 12:09 PM   Specimen: BLOOD  Result Value Ref Range Status   Specimen Description   Final    BLOOD PORTA CATH Performed at [facility], [place] [address], [place], [place] [address]    Special Requests   Final    BOTTLES DRAWN AEROBIC AND ANAEROBIC Blood Culture adequate volume Performed at [facility] [place] [facility] [address] [place], [place] [address]    Culture  Setup Time   Final    GRAM POSITIVE COCCI IN PAIRS IN CHAINS IN BOTH AEROBIC AND ANAEROBIC BOTTLES Organism ID to follow CRITICAL RESULT CALLED TO, READ BACK BY AND VERIFIED WITH: Karie Kirks PharmD 9:00 01/23/20 ([email or web address]) Performed at [facility], [place] [address], [place], [place] [address]    Culture ENTEROCOCCUS GALLINARUM (A)  Final   Report Status 01/25/2020 FINAL  Final   Organism ID, Bacteria ENTEROCOCCUS GALLINARUM  Final      Susceptibility   Enterococcus gallinarum - MIC*    AMPICILLIN <=2 SENSITIVE Sensitive  VANCOMYCIN RESISTANT Resistant     GENTAMICIN SYNERGY SENSITIVE Sensitive     LINEZOLID 1 SENSITIVE Sensitive     * ENTEROCOCCUS GALLINARUM  Blood Culture ID Panel (Reflexed)     Status: Abnormal   Collection Time: 01/22/20 12:09 PM  Result Value Ref Range Status   Enterococcus species DETECTED (A) NOT DETECTED Final    Comment: CRITICAL RESULT CALLED TO, READ BACK BY AND VERIFIED WITH: Karie Kirks PharmD 9:00 01/23/20 ([email or web address])    Vancomycin resistance NOT DETECTED NOT DETECTED Final   Listeria monocytogenes NOT DETECTED NOT DETECTED Final   Staphylococcus species NOT DETECTED NOT DETECTED Final   Staphylococcus aureus (BCID)  NOT DETECTED NOT DETECTED Final   Streptococcus species NOT DETECTED NOT DETECTED Final   Streptococcus agalactiae NOT DETECTED NOT DETECTED Final   Streptococcus pneumoniae NOT DETECTED NOT DETECTED Final   Streptococcus pyogenes NOT DETECTED NOT DETECTED Final   Acinetobacter baumannii NOT DETECTED NOT DETECTED Final   Enterobacteriaceae species NOT DETECTED NOT DETECTED Final   Enterobacter cloacae complex NOT DETECTED NOT DETECTED Final   Escherichia coli NOT DETECTED NOT DETECTED Final   Klebsiella oxytoca NOT DETECTED NOT DETECTED Final   Klebsiella pneumoniae NOT DETECTED NOT DETECTED Final   Proteus species NOT DETECTED NOT DETECTED Final   Serratia marcescens NOT DETECTED NOT DETECTED Final   Haemophilus influenzae NOT DETECTED NOT DETECTED Final   Neisseria meningitidis NOT DETECTED NOT DETECTED Final   Pseudomonas aeruginosa NOT DETECTED NOT DETECTED Final   Candida albicans NOT DETECTED NOT DETECTED Final   Candida glabrata NOT DETECTED NOT DETECTED Final   Candida krusei NOT DETECTED NOT DETECTED Final   Candida parapsilosis NOT DETECTED NOT DETECTED Final   Candida tropicalis NOT DETECTED NOT DETECTED Final    Comment: Performed at [facility], [address] [place], [place] [address]  SARS Coronavirus 2 by RT PCR (hospital order, performed in [facility] hospital lab) Nasopharyngeal Nasopharyngeal Swab     Status: None   Collection Time: 01/22/20 12:10 PM   Specimen: Nasopharyngeal Swab  Result Value Ref Range Status   SARS Coronavirus 2 NEGATIVE NEGATIVE Final    Comment: (NOTE) SARS-CoV-2 target nucleic acids are NOT DETECTED. The SARS-CoV-2 RNA is generally detectable in upper and lower respiratory specimens during the acute phase of infection. The lowest concentration of SARS-CoV-2 viral copies this assay can detect is 250 copies / mL. A negative result does not preclude SARS-CoV-2 infection and should not be used as the sole basis for treatment or  other patient management decisions.  A negative result may occur with improper specimen collection / handling, submission of specimen other than nasopharyngeal swab, presence of viral mutation(s) within the areas targeted by this assay, and inadequate number of viral copies (<250 copies / mL). A negative result must be combined with clinical observations, patient history, and epidemiological information. Fact Sheet for Patients:   StrictlyIdeas.no Fact Sheet for Healthcare Providers: BankingDealers.co.za This test is not yet approved or cleared  by the [place] FDA and has been authorized for detection and/or diagnosis of SARS-CoV-2 by FDA under an Emergency Use Authorization (EUA).  This EUA will remain in effect (meaning this test can be used) for the duration of the COVID-19 declaration under Section 564(b)(1) of the Act, 21 U.S.C. section 360bbb-3(b)(1), unless the authorization is terminated or revoked sooner. Performed at [facility], [place] [address], [place], [place] [address]   Culture, blood (routine x 2)     Status:  None   Collection Time: 01/23/20 10:25 AM   Specimen: BLOOD  Result Value Ref Range Status   Specimen Description   Final    BLOOD RIGHT ARM Performed at [facility] [address], [place], [place] [address]    Special Requests   Final    BOTTLES DRAWN AEROBIC AND ANAEROBIC Blood Culture adequate volume Performed at [place] [facility] [address], [place], [place] [address]    Culture   Final    NO GROWTH 5 DAYS Performed at [facility], [place] [address] [place], [place] [address]    Report Status 01/28/2020 FINAL  Final  Culture, blood (routine x 2)     Status: None   Collection Time: 01/23/20 10:35 AM   Specimen: BLOOD RIGHT HAND  Result Value Ref Range Status   Specimen Description   Final    BLOOD RIGHT HAND Performed at [facility] [address], [place], [place] [address]    Special Requests   Final    BOTTLES DRAWN AEROBIC AND ANAEROBIC Blood Culture adequate volume Performed at [facility] [address], [place], [place] [address]    Culture   Final    NO GROWTH 5 DAYS Performed at [facility], [place] [address] [place],  [address]    Report Status 01/28/2020 FINAL  Final    Coagulation Studies: No results for input(s): LABPROT, INR in the last 72 hours.  Urinalysis: No results for input(s): COLORURINE, LABSPEC, PHURINE, GLUCOSEU, HGBUR, BILIRUBINUR, KETONESUR, PROTEINUR, UROBILINOGEN, NITRITE, LEUKOCYTESUR in the last 72 hours.  Invalid input(s): APPERANCEUR    Imaging: No results found.   Medications:    . Chlorhexidine Gluconate Cloth  6 each Topical Daily  . heparin  5,000 Units Subcutaneous Q8H   acetaminophen **OR** acetaminophen, HYDROcodone-acetaminophen, ondansetron **OR** ondansetron (ZOFRAN) IV  Assessment/ Plan:  1. AoCKD3 - baseline creat 1.2- 1.4. AKI peak creat 6.0 due to hypotension, ACE inhibitors, nonsteroidal anti-inflammatory drugs, Cox 2 inhibitors, diuretics. Urinalysis was bland although did show 6-10 WBCs per high-power field with many bacteria.  Renal ultrasound did not reveal any evidence of hydronephrosis  2D echo does not reveal any pericardial effusion.  EF 65%.  Cortisol 11.8. Creat down to 1.8 today, close to baseline. Would avoid acei/ARB/ nsaids and COXII agents for 1 month and hold diuretics until needed per PCP. Can f/u w/ PCP. Will sign off.  2. Hypertension/volume - BP's wnl, home BP meds on hold 4.  Breast cancer-chemotherapy on hold until stabilization of renal function. 5.   Hyponatremia - better 6.  Enterococcus bacteremia - appreciate assistance from Dr Linus Salmons.  Patient started on ampicillin 01/23/2020.  Patient does have Port-A-Cath   Kelly Splinter, MD 01/28/2020, 1:50 PM

## 2020-01-28 NOTE — Discharge Summary (Signed)
Physician Discharge Summary  [name] YSA:[id number] DOB: [birth year]   PCP: [name], MD  Admit date: 01/22/2020 Discharge date: 01/28/2020 Length of Stay: 6 days   Code Status: DNR  Admitted From:  Home Discharged to:   [facility]: [facility]  Equipment/Devices:  None Discharge Condition:  Stable  Recommendations for Outpatient Follow-up   1. Follow up with PCP in 1 week 2. Follow up BMP 3. Holding NSAIDs 4. All BP meds held as she tolerated no antihypertensives, please reevaluate outpatient  Hospital Summary  This is a 64 year old female with history of left breast cancer s/p mastectomy, diverticulitis, hypertension, CKD 3b who presented to the ED from cancer center prior to receiving chemo after she was found to have low blood pressure.  She was given IV fluids which helped her BP but labs showed significantly elevated creatinine and diagnosed with AKI on CKD.  Nephrology was consulted.  She was noted to be on multiple nephrotoxic agents including Benzapril, HCTZ, NSAIDs and also Lasix was stopped by a physician about a week prior to admission.  ED Course:Upon arrival to ED, her blood pressure was fairly normal at 112/71. There is one reading of 41/32 that is an error. Patient is asymptomatic. BMP was repeated which showed creatinine of 6.02 with sodium of 119. EDP already consulted nephrology and then called hospital service for admission.  5/26: Blood culture positive for Enterococcus and started on ampicillin.  ID consulted  5/28: Ampicillin discontinued as bacteremia thought to be a contaminant.  5/31: improvement in hyponatremia and aki. Ok for discharge from nephro standpoint with outpatient labs, holding NSAIDs and BP meds held as she had hypotension during hospitalization and tolerated no antihypertensives   A & P   Active Problems:   Essential hypertension   S/P left mastectomy   Acute renal failure (ARF) (HCC)   Acute hyponatremia    1. AKI on  CKD 3b a. Multifactorial due to nephrotoxic agents and likely volume depletion b. Cr 6.02 on admission, improved to 1.98 following discontinuation of NSAIDs, antihypertensives c. Improved after lasix and sodium bicarbonate, now off lasix and bicarb d. nephro on board: ok to discharge with outpatient labs and follow up  2. Asymptomatic Hyponatremia a. 123-> 132, improved with above interventions  3. Enterococcus positive blood culture in setting of a Port-A-Cath, likely contaminant a. Started on ampicillin on 5/26->5/28, doing well off antibiotics b. ID consulted: No indication for Port-A-Cath removal and no indication to continue antibiotics. okay from ID standpoint to resume chemotherapy as indicated if blood cultures remain negative c. Echo 5/25 without vegetations noted  4. Hypertension/hypotensive episode a. Stable off antihypertensives  5. Normocytic anemia a. Hb 7.0 -> 7.4->7.1 b. No sign of bleeding and stable c. Follow up with Hematology/oncology with labs  6. Metabolic acidosis resolved with sodium bicarb  7. Breast cancer a. Sent in from cancer center prior to starting her chemotherapy b. Follow-up with outpatient oncology    Consultants  . Nephrology . oncology  Procedures  . none  Antibiotics   Anti-infectives (From admission, onward)   Start     Dose/Rate Route Frequency Ordered Stop   01/23/20 1000  ampicillin (OMNIPEN) 2 g in sodium chloride 0.9 % 100 mL IVPB  Status:  Discontinued     2 g 300 mL/hr over 20 Minutes Intravenous Every 8 hours 01/23/20 0924 01/25/20 0951       Subjective  Patient seen and examined at bedside no acute distress and resting comfortably.  No  events overnight.  Tolerating diet. In good spirits and anticipating discharge.   Denies any chest pain, shortness of breath, fever, nausea, vomiting, urinary or bowel complaints. Otherwise ROS negative    Objective   Discharge Exam: Vitals:   01/27/20 2047 01/28/20 0556   BP: 105/68 (!) 124/56  Pulse: 100 97  Resp: 16 17  Temp: 99 F (37.2 C) 98.7 F (37.1 C)  SpO2: 100% 97%   Vitals:   01/27/20 0607 01/27/20 1327 01/27/20 2047 01/28/20 0556  BP: 103/61 114/66 105/68 (!) 124/56  Pulse: 93 96 100 97  Resp: 18 15 16 17   Temp: 97.9 F (36.6 C) 97.7 F (36.5 C) 99 F (37.2 C) 98.7 F (37.1 C)  TempSrc: Oral Oral Oral Oral  SpO2: 98% 100% 100% 97%  Weight: (!) 142.7 kg     Height:        Physical Exam Vitals and nursing note reviewed.  Constitutional:      Appearance: Normal appearance.  HENT:     Head: Normocephalic and atraumatic.  Eyes:     Conjunctiva/sclera: Conjunctivae normal.  Cardiovascular:     Rate and Rhythm: Normal rate and regular rhythm.  Pulmonary:     Effort: Pulmonary effort is normal.     Breath sounds: Normal breath sounds.  Abdominal:     General: Abdomen is flat.     Palpations: Abdomen is soft.  Musculoskeletal:        General: No swelling or tenderness.  Skin:    Coloration: Skin is not jaundiced or pale.  Neurological:     Mental Status: She is alert. Mental status is at baseline.  Psychiatric:        Mood and Affect: Mood normal.        Behavior: Behavior normal.       The results of significant diagnostics from this hospitalization (including imaging, microbiology, ancillary and laboratory) are listed below for reference.     Microbiology: Recent Results (from the past 240 hour(s))  Culture, blood (Routine X 2) w Reflex to ID Panel     Status: Abnormal   Collection Time: 01/22/20 12:09 PM   Specimen: BLOOD  Result Value Ref Range Status   Specimen Description   Final    BLOOD PORTA CATH Performed at [facility], [address] [place], [place] [address]    Special Requests   Final    BOTTLES DRAWN AEROBIC AND ANAEROBIC Blood Culture adequate volume Performed at [facility] [address] [place], [place] [address]    Culture  Setup Time   Final    GRAM  POSITIVE COCCI IN PAIRS IN CHAINS IN BOTH AEROBIC AND ANAEROBIC BOTTLES Organism ID to follow CRITICAL RESULT CALLED TO, READ BACK BY AND VERIFIED WITH: Karie Kirks PharmD 9:00 01/23/20 ([email or web address]) Performed at [facility], [place] [address], [place], [place] [address]    Culture ENTEROCOCCUS GALLINARUM (A)  Final   Report Status 01/25/2020 FINAL  Final   Organism ID, Bacteria ENTEROCOCCUS GALLINARUM  Final      Susceptibility   Enterococcus gallinarum - MIC*    AMPICILLIN <=2 SENSITIVE Sensitive     VANCOMYCIN RESISTANT Resistant     GENTAMICIN SYNERGY SENSITIVE Sensitive     LINEZOLID 1 SENSITIVE Sensitive     * ENTEROCOCCUS GALLINARUM  Blood Culture ID Panel (Reflexed)     Status: Abnormal   Collection Time: 01/22/20 12:09 PM  Result Value Ref Range Status   Enterococcus species  DETECTED (A) NOT DETECTED Final    Comment: CRITICAL RESULT CALLED TO, READ BACK BY AND VERIFIED WITH: Karie Kirks PharmD 9:00 01/23/20 ([email or web address])    Vancomycin resistance NOT DETECTED NOT DETECTED Final   Listeria monocytogenes NOT DETECTED NOT DETECTED Final   Staphylococcus species NOT DETECTED NOT DETECTED Final   Staphylococcus aureus (BCID) NOT DETECTED NOT DETECTED Final   Streptococcus species NOT DETECTED NOT DETECTED Final   Streptococcus agalactiae NOT DETECTED NOT DETECTED Final   Streptococcus pneumoniae NOT DETECTED NOT DETECTED Final   Streptococcus pyogenes NOT DETECTED NOT DETECTED Final   Acinetobacter baumannii NOT DETECTED NOT DETECTED Final   Enterobacteriaceae species NOT DETECTED NOT DETECTED Final   Enterobacter cloacae complex NOT DETECTED NOT DETECTED Final   Escherichia coli NOT DETECTED NOT DETECTED Final   Klebsiella oxytoca NOT DETECTED NOT DETECTED Final   Klebsiella pneumoniae NOT DETECTED NOT DETECTED Final   Proteus species NOT DETECTED NOT DETECTED Final   Serratia marcescens NOT DETECTED NOT DETECTED Final   Haemophilus influenzae NOT DETECTED NOT DETECTED Final    Neisseria meningitidis NOT DETECTED NOT DETECTED Final   Pseudomonas aeruginosa NOT DETECTED NOT DETECTED Final   Candida albicans NOT DETECTED NOT DETECTED Final   Candida glabrata NOT DETECTED NOT DETECTED Final   Candida krusei NOT DETECTED NOT DETECTED Final   Candida parapsilosis NOT DETECTED NOT DETECTED Final   Candida tropicalis NOT DETECTED NOT DETECTED Final    Comment: Performed at [facility], [address] [place], [place] [address]  SARS Coronavirus 2 by RT PCR (hospital order, performed in [facility] hospital lab) Nasopharyngeal Nasopharyngeal Swab     Status: None   Collection Time: 01/22/20 12:10 PM   Specimen: Nasopharyngeal Swab  Result Value Ref Range Status   SARS Coronavirus 2 NEGATIVE NEGATIVE Final    Comment: (NOTE) SARS-CoV-2 target nucleic acids are NOT DETECTED. The SARS-CoV-2 RNA is generally detectable in upper and lower respiratory specimens during the acute phase of infection. The lowest concentration of SARS-CoV-2 viral copies this assay can detect is 250 copies / mL. A negative result does not preclude SARS-CoV-2 infection and should not be used as the sole basis for treatment or other patient management decisions.  A negative result may occur with improper specimen collection / handling, submission of specimen other than nasopharyngeal swab, presence of viral mutation(s) within the areas targeted by this assay, and inadequate number of viral copies (<250 copies / mL). A negative result must be combined with clinical observations, patient history, and epidemiological information. Fact Sheet for Patients:   StrictlyIdeas.no Fact Sheet for Healthcare Providers: BankingDealers.co.za This test is not yet approved or cleared  by the [place] FDA and has been authorized for detection and/or diagnosis of SARS-CoV-2 by FDA under an Emergency Use Authorization (EUA).  This EUA will remain in effect  (meaning this test can be used) for the duration of the COVID-19 declaration under Section 564(b)(1) of the Act, 21 U.S.C. section 360bbb-3(b)(1), unless the authorization is terminated or revoked sooner. Performed at [facility], [place] [address], [place], [place] [address]   Culture, blood (routine x 2)     Status: None   Collection Time: 01/23/20 10:25 AM   Specimen: BLOOD  Result Value Ref Range Status   Specimen Description   Final    BLOOD RIGHT ARM Performed at [facility] [address] [place], [place] [address]    Special Requests   Final  BOTTLES DRAWN AEROBIC AND ANAEROBIC Blood Culture adequate volume Performed at [facility] [address], [place], [place] [address]    Culture   Final    NO GROWTH 5 DAYS Performed at [facility], [place] [address] [place], [place] [address]    Report Status 01/28/2020 FINAL  Final  Culture, blood (routine x 2)     Status: None   Collection Time: 01/23/20 10:35 AM   Specimen: BLOOD RIGHT HAND  Result Value Ref Range Status   Specimen Description   Final    BLOOD RIGHT HAND Performed at [facility] [address], [place], [place] [address]    Special Requests   Final    BOTTLES DRAWN AEROBIC AND ANAEROBIC Blood Culture adequate volume Performed at [place] [address] [place], [place] [address]    Culture   Final    NO GROWTH 5 DAYS Performed at [facility], [place] [address], [place], [place] [address]    Report Status 01/28/2020 FINAL  Final     Labs: BNP (last 3 results) No results for input(s): BNP in the last 8760 hours. Basic Metabolic Panel: Recent Labs  Lab 01/22/20 1400 01/23/20 0531 01/24/20 0500 01/24/20 1542 01/24/20 2254 01/25/20 0500 01/26/20 0500 01/27/20 0500 01/28/20 0554  NA  --    < > 118*   < > 124* 125*  123* 125*  125* 130*  132* 132*  132*  K  --    < > 4.6   < >  4.0 4.2  4.0 4.2  4.2 4.3  4.3 4.6  4.6  CL  --    < > 80*   < > 85* 85*  85* 87*  88* 92*  93* 95*  94*  CO2  --    < > 26   < > 26 26  27 29  28 28  29 30  29   GLUCOSE  --    < > 93   < > 123* 104*  104* 103*  101* 94  92 95  95  BUN  --    < > 71*   < > 67* 65*  67* 63*  64* 52*  55* 45*  45*  CREATININE 5.76*   < > 4.04*   < > 3.34* 3.20*  3.14* 2.55*  2.65* 2.33*  2.19* 1.98*  1.95*  CALCIUM  --    < > 7.3*   < > 7.9* 8.1*  8.2* 8.3*  8.2* 8.4*  8.5* 8.5*  8.4*  MG 1.3*  --   --   --   --   --   --   --   --   PHOS  --    < > 4.1  --   --  4.4 3.9 3.9 4.1   < > = values in this interval not displayed.   Liver Function Tests: Recent Labs  Lab 01/22/20 0840 01/22/20 0840 01/23/20 0531 01/23/20 0531 01/24/20 0500 01/25/20 0500 01/26/20 0500 01/27/20 0500 01/28/20 0554  AST 35  --  43*  --   --   --   --   --   --   ALT 34  --  33  --   --   --   --   --   --   ALKPHOS 62  --  49  --   --   --   --   --   --  BILITOT 0.2*  --  0.3  --   --   --   --   --   --   PROT 7.2  --  6.4*  --   --   --   --   --   --   ALBUMIN 2.7*   < > 2.7*   < > 2.6* 2.5* 2.5* 2.6* 2.3*   < > = values in this interval not displayed.   No results for input(s): LIPASE, AMYLASE in the last 168 hours. No results for input(s): AMMONIA in the last 168 hours. CBC: Recent Labs  Lab 01/22/20 0840 01/22/20 1400 01/25/20 0500 01/26/20 0500 01/26/20 1351 01/27/20 0500 01/28/20 0554  WBC 6.3   < > 7.6 8.7 9.5 9.9 9.2  NEUTROABS 2.8  --   --   --   --   --   --   HGB 8.4*   < > 7.6* 7.0* 7.7* 7.4* 7.1*  HCT 23.8*   < > 21.8* 20.5* 22.1* 22.0* 20.9*  MCV 92.2   < > 94.8 94.9 95.7 96.9 96.8  PLT 210   < > 213 209 253 239 215   < > = values in this interval not displayed.   Cardiac Enzymes: No results for input(s): CKTOTAL, CKMB, CKMBINDEX, TROPONINI in the last 168 hours. BNP: Invalid input(s): POCBNP CBG: No results for input(s): GLUCAP in the last 168  hours. D-Dimer No results for input(s): DDIMER in the last 72 hours. Hgb A1c No results for input(s): HGBA1C in the last 72 hours. Lipid Profile No results for input(s): CHOL, HDL, LDLCALC, TRIG, CHOLHDL, LDLDIRECT in the last 72 hours. Thyroid function studies No results for input(s): TSH, T4TOTAL, T3FREE, THYROIDAB in the last 72 hours.  Invalid input(s): FREET3 Anemia work up No results for input(s): VITAMINB12, FOLATE, FERRITIN, TIBC, IRON, RETICCTPCT in the last 72 hours. Urinalysis    Component Value Date/Time   COLORURINE YELLOW 01/22/2020 1601   APPEARANCEUR HAZY (A) 01/22/2020 1601   LABSPEC 1.009 01/22/2020 1601   PHURINE 5.0 01/22/2020 1601   GLUCOSEU NEGATIVE 01/22/2020 1601   HGBUR NEGATIVE 01/22/2020 1601   BILIRUBINUR NEGATIVE 01/22/2020 1601   KETONESUR NEGATIVE 01/22/2020 1601   PROTEINUR NEGATIVE 01/22/2020 1601   UROBILINOGEN 1.0 07/14/2012 1630   NITRITE NEGATIVE 01/22/2020 1601   LEUKOCYTESUR SMALL (A) 01/22/2020 1601   Sepsis Labs Invalid input(s): PROCALCITONIN,  WBC,  LACTICIDVEN Microbiology Recent Results (from the past 240 hour(s))  Culture, blood (Routine X 2) w Reflex to ID Panel     Status: Abnormal   Collection Time: 01/22/20 12:09 PM   Specimen: BLOOD  Result Value Ref Range Status   Specimen Description   Final    BLOOD PORTA CATH Performed at [facility], [place] [address], [place], [place] [address]    Special Requests   Final    BOTTLES DRAWN AEROBIC AND ANAEROBIC Blood Culture adequate volume Performed at [facility] [address], [place], [place] [address]    Culture  Setup Time   Final    GRAM POSITIVE COCCI IN PAIRS IN CHAINS IN BOTH AEROBIC AND ANAEROBIC BOTTLES Organism ID to follow CRITICAL RESULT CALLED TO, READ BACK BY AND VERIFIED WITH: Karie Kirks PharmD 9:00 01/23/20 ([email or web address]) Performed at [facility], [place] [address] [place], [place] [address]    Culture ENTEROCOCCUS GALLINARUM (A)  Final    Report Status 01/25/2020 FINAL  Final   Organism ID, Bacteria ENTEROCOCCUS GALLINARUM  Final      Susceptibility   Enterococcus gallinarum - MIC*    AMPICILLIN <=2 SENSITIVE Sensitive     VANCOMYCIN RESISTANT Resistant     GENTAMICIN SYNERGY SENSITIVE Sensitive     LINEZOLID 1 SENSITIVE Sensitive     * ENTEROCOCCUS GALLINARUM  Blood Culture ID Panel (Reflexed)     Status: Abnormal   Collection Time: 01/22/20 12:09 PM  Result Value Ref Range Status   Enterococcus species DETECTED (A) NOT DETECTED Final    Comment: CRITICAL RESULT CALLED TO, READ BACK BY AND VERIFIED WITH: Karie Kirks PharmD 9:00 01/23/20 ([email or web address])    Vancomycin resistance NOT DETECTED NOT DETECTED Final   Listeria monocytogenes NOT DETECTED NOT DETECTED Final   Staphylococcus species NOT DETECTED NOT DETECTED Final   Staphylococcus aureus (BCID) NOT DETECTED NOT DETECTED Final   Streptococcus species NOT DETECTED NOT DETECTED Final   Streptococcus agalactiae NOT DETECTED NOT DETECTED Final   Streptococcus pneumoniae NOT DETECTED NOT DETECTED Final   Streptococcus pyogenes NOT DETECTED NOT DETECTED Final   Acinetobacter baumannii NOT DETECTED NOT DETECTED Final   Enterobacteriaceae species NOT DETECTED NOT DETECTED Final   Enterobacter cloacae complex NOT DETECTED NOT DETECTED Final   Escherichia coli NOT DETECTED NOT DETECTED Final   Klebsiella oxytoca NOT DETECTED NOT DETECTED Final   Klebsiella pneumoniae NOT DETECTED NOT DETECTED Final   Proteus species NOT DETECTED NOT DETECTED Final   Serratia marcescens NOT DETECTED NOT DETECTED Final   Haemophilus influenzae NOT DETECTED NOT DETECTED Final   Neisseria meningitidis NOT DETECTED NOT DETECTED Final   Pseudomonas aeruginosa NOT DETECTED NOT DETECTED Final   Candida albicans NOT DETECTED NOT DETECTED Final   Candida glabrata NOT DETECTED NOT DETECTED Final   Candida krusei NOT DETECTED NOT DETECTED Final   Candida parapsilosis NOT DETECTED NOT DETECTED Final    Candida tropicalis NOT DETECTED NOT DETECTED Final    Comment: Performed at [facility], [address], [place], [place] [address]  SARS Coronavirus 2 by RT PCR (hospital order, performed in [facility] hospital lab) Nasopharyngeal Nasopharyngeal Swab     Status: None   Collection Time: 01/22/20 12:10 PM   Specimen: Nasopharyngeal Swab  Result Value Ref Range Status   SARS Coronavirus 2 NEGATIVE NEGATIVE Final    Comment: (NOTE) SARS-CoV-2 target nucleic acids are NOT DETECTED. The SARS-CoV-2 RNA is generally detectable in upper and lower respiratory specimens during the acute phase of infection. The lowest concentration of SARS-CoV-2 viral copies this assay can detect is 250 copies / mL. A negative result does not preclude SARS-CoV-2 infection and should not be used as the sole basis for treatment or other patient management decisions.  A negative result may occur with improper specimen collection / handling, submission of specimen other than nasopharyngeal swab, presence of viral mutation(s) within the areas targeted by this assay, and inadequate number of viral copies (<250 copies / mL). A negative result must be combined with clinical observations, patient history, and epidemiological information. Fact Sheet for Patients:   StrictlyIdeas.no Fact Sheet for Healthcare Providers: BankingDealers.co.za This test is not yet approved or cleared  by the [place] FDA and has been authorized for detection and/or diagnosis of SARS-CoV-2 by FDA under an Emergency Use Authorization (EUA).  This EUA will remain in effect (meaning this test can be used) for the duration of the COVID-19 declaration under Section 564(b)(1) of the Act, 21 U.S.C. section 360bbb-3(b)(1), unless the authorization is terminated or revoked sooner. Performed at  [facility], [place] [address] [place], [place] [address]   Culture, blood (routine  x 2)     Status: None   Collection Time: 01/23/20 10:25 AM   Specimen: BLOOD  Result Value Ref Range Status   Specimen Description   Final    BLOOD RIGHT ARM Performed at [facility] [address], [place], [place] [address]    Special Requests   Final    BOTTLES DRAWN AEROBIC AND ANAEROBIC Blood Culture adequate volume Performed at [facility] [address] [place], [place] [address]    Culture   Final    NO GROWTH 5 DAYS Performed at [facility], [place] [address] [place], [place] [address]    Report Status 01/28/2020 FINAL  Final  Culture, blood (routine x 2)     Status: None   Collection Time: 01/23/20 10:35 AM   Specimen: BLOOD RIGHT HAND  Result Value Ref Range Status   Specimen Description   Final    BLOOD RIGHT HAND Performed at [facility] [address], [place], [place] [address]    Special Requests   Final    BOTTLES DRAWN AEROBIC AND ANAEROBIC Blood Culture adequate volume Performed at [facility] [address] [place], [place] [address]    Culture   Final    NO GROWTH 5 DAYS Performed at [facility], [place] [address], [place], [place] [address]    Report Status 01/28/2020 FINAL  Final    Discharge Instructions     Discharge Instructions    Diet - low sodium heart healthy   Complete by: As directed    Discharge instructions   Complete by: As directed    - stop taking your home medications for now - get blood work in a few days - follow up with your primary care physician - follow up with your oncologist per your usual appointment - do not take any NSAIDs such as meloxicam, ibuprofen, naproxen, motrin, aleve, advil. Take Tylenol as needed for pain instead - increase your water intake - if you have any severe worsening of your symptoms, do not hesitate to contact your physician or return to the ED   Increase activity slowly   Complete by: As directed       Allergies as of 01/28/2020      Reactions   Amlodipine Swelling   BLE edema      Medication List    STOP taking these medications   amoxicillin 500 MG capsule Commonly known as: AMOXIL   amoxicillin-clavulanate 500-125 MG tablet Commonly known as: Augmentin   benazepril 20 MG tablet Commonly known as: LOTENSIN   Centrum Silver Adult 50+ Tabs   [employer] Advanced BP Monitor Devi   dexamethasone 4 MG tablet Commonly known as: DECADRON   HAIR SKIN AND NAILS FORMULA PO   hydrochlorothiazide 25 MG tablet Commonly known as: HYDRODIURIL   HYDROcodone-Acetaminophen 5-300 MG Tabs   ibuprofen 600 MG tablet Commonly known as: ADVIL   Klor-Con M20 20 MEQ tablet Generic drug: potassium chloride SA   lidocaine-prilocaine cream Commonly known as: EMLA   meloxicam 15 MG tablet Commonly known as: MOBIC   metoprolol tartrate 50 MG tablet Commonly known as: LOPRESSOR   ondansetron 4 MG tablet Commonly known as: ZOFRAN   oxyCODONE-acetaminophen 5-325 MG tablet Commonly known as: Percocet   prochlorperazine 10 MG tablet Commonly known as: COMPAZINE       Allergies  Allergen Reactions  . Amlodipine Swelling    BLE edema    Dispo: The patient is from: Home              Anticipated d/c is to: Home              Anticipated d/c date SU:[id number]              Patient currently is medically stable to d/c.        Time coordinating discharge: Over 30 minutes   SIGNED:   Harold Hedge, D.O. [facility] Pager: [phone]  01/28/2020, 1:12 PM

## 2020-01-28 NOTE — TOC Initial Note (Signed)
Transition of Care [facility]) - Initial/Assessment Note    Patient Details  Name: [name] MRN: [id number] Date of Birth: 1956-04-16  Transition of Care [facility]) CM/SW Contact:    Lia Hopping, [place] Phone Number: 01/28/2020, 3:05 PM  Clinical Narrative:                 CSW unable to find home health agency to provide HHPT due to staffing or Out of network with the insurance. CSW reached out to all [place] in the patient service area. CSW notified the physician.  Patent is agreeable to OPPT, referral made to [facility] location.  Patient has DME No other needs identified.   Expected Discharge Plan: [place] Barriers to Discharge: No [place] will accept this patient   Patient Goals and CMS Choice Patient states their goals for this hospitalization and ongoing recovery are:: to return home CMS Medicare.gov Compare Post Acute Care list provided to:: Patient Choice offered to / list presented to : Patient  Expected Discharge Plan and Services Expected Discharge Plan: [place] In-house Referral: NA Discharge Planning Services: NA Post Acute Care Choice: [place] arrangements for the past 2 months: Single Family Home Expected Discharge Date: 01/28/20               DME Arranged: N/A DME Agency: NA                  Prior Living Arrangements/Services Living arrangements for the past 2 months: Single Family Home Lives with:: Self   Do you feel safe going back to the place where you live?: Yes      Need for Family Participation in Patient Care: Yes (Comment) Care giver support system in place?: Yes (comment) Current home services: DME Criminal Activity/Legal Involvement Pertinent to Current Situation/Hospitalization: No - Comment as needed  Activities of Daily Living Home Assistive Devices/Equipment: Eyeglasses, Environmental consultant (specify type), Wheelchair ADL Screening (condition at time of  admission) Patient's cognitive ability adequate to safely complete daily activities?: Yes Is the patient deaf or have difficulty hearing?: No Does the patient have difficulty seeing, even when wearing glasses/contacts?: No Does the patient have difficulty concentrating, remembering, or making decisions?: No Patient able to express need for assistance with ADLs?: Yes Does the patient have difficulty dressing or bathing?: No Independently performs ADLs?: Yes (appropriate for developmental age) Does the patient have difficulty walking or climbing stairs?: No Weakness of Legs: None Weakness of Arms/Hands: None  Permission Sought/Granted   Permission granted to share information with : Yes, Verbal Permission Granted     Permission granted to share info w AGENCY: Home Health agencies        Emotional Assessment   Attitude/Demeanor/Rapport: Engaged   Orientation: : Oriented to Self, Oriented to Place, Oriented to  Time, Oriented to Situation   Psych Involvement: No (comment)  Admission diagnosis:  Acute renal failure (ARF) ([place]) [N17.9] Patient Active Problem List   Diagnosis Date Noted  . Acute renal failure (ARF) ([place]) 01/22/2020  . Acute hyponatremia 01/22/2020  . S/P left mastectomy 12/19/2019  . Genetic testing 11/30/2019  . Morbid obesity with BMI of 60.0-69.9, adult ([place]) 11/21/2019  . Family history of ovarian cancer   . Malignant neoplasm of upper-outer quadrant of left breast in female, estrogen receptor positive ([place]) 11/14/2019  . Fracture of radial head, right, closed 06/25/2019  . Frequent falls 01/10/2019  . Bilateral bunions 01/10/2019  . Toenail fungus  01/10/2019  . Alcoholic hepatitis 48/68/8520  . Edema 11/20/2018  . Alcohol-induced mood disorder ([place]) 11/01/2013  . Alcohol dependence ([place]) 11/01/2013  . Essential hypertension 06/20/2007  . LOW BACK PAIN 06/20/2007  . DIVERTICULOSIS, COLON 05/05/2007   PCP:  [name], MD Pharmacy:   [id number] - [place], [place] [address] Phone: [phone] Fax: [phone]     Social Determinants of Health (SDOH) Interventions    Readmission Risk Interventions No flowsheet data found.

## 2020-01-29 ENCOUNTER — Encounter: Payer: Self-pay | Admitting: *Deleted

## 2020-01-30 ENCOUNTER — Inpatient Hospital Stay: Payer: Medicaid Other | Admitting: Adult Health

## 2020-01-30 ENCOUNTER — Inpatient Hospital Stay: Payer: Medicaid Other

## 2020-01-30 ENCOUNTER — Telehealth: Payer: Self-pay

## 2020-01-30 NOTE — Telephone Encounter (Signed)
Transition Care Management Follow-up Telephone Call Date of discharge and from where: 05/31//2021 [name] How have you been since you were released from the hospital? Feeling Ok little tired Any questions or concerns? None  Items Reviewed: Did the pt receive and understand the discharge instructions provided? YES Medications obtained and verified? No meds were prescribed / understood all meds that she has to stop for now as per Hospital MD discharge instructions.  Any new allergies since your discharge? NONE Dietary orders reviewed? Yes  Do you have support at home? Sister  Functional Questionnaire: (I = Independent and D = Dependent) ADLs: I   Follow up appointments reviewed:  PCP Hospital f/u appt confirmed?  Scheduled to see Dr Chapman Fitch on 0//2021   Specialist Hospital f/u appt confirmed? Wound care and oncologist appt done  Are transportation arrangements needed? NO  If their condition worsens, /is the pt aware to call PCP or go to the Emergency Dept.?  Pt is aware if condition is worsening or start experiencing any of diff breathing, SOB, chest pain, extreme fatigue,  Persistent nausea and vomiting, bleeding , rapid weight gain, severe uncontrolled pain, or visual disturbances to return to ED  Was the patient provided with contact information for the PCP's office or ED? YES given.  Was to pt encouraged to call back with questions or concerns?YES name and contact information .  Other DME   Pt will have her blood drawn at the oncology center.  Pt was discharged home with HHS. Referral for PT therapy /

## 2020-01-31 ENCOUNTER — Inpatient Hospital Stay: Payer: Medicaid Other | Attending: Oncology

## 2020-01-31 ENCOUNTER — Encounter: Payer: Self-pay | Admitting: Oncology

## 2020-01-31 ENCOUNTER — Other Ambulatory Visit: Payer: Self-pay

## 2020-01-31 DIAGNOSIS — Z823 Family history of stroke: Secondary | ICD-10-CM | POA: Insufficient documentation

## 2020-01-31 DIAGNOSIS — Z8249 Family history of ischemic heart disease and other diseases of the circulatory system: Secondary | ICD-10-CM | POA: Insufficient documentation

## 2020-01-31 DIAGNOSIS — Z170 Estrogen receptor positive status [ER+]: Secondary | ICD-10-CM | POA: Diagnosis not present

## 2020-01-31 DIAGNOSIS — Z5111 Encounter for antineoplastic chemotherapy: Secondary | ICD-10-CM | POA: Diagnosis not present

## 2020-01-31 DIAGNOSIS — I10 Essential (primary) hypertension: Secondary | ICD-10-CM | POA: Diagnosis not present

## 2020-01-31 DIAGNOSIS — Z79899 Other long term (current) drug therapy: Secondary | ICD-10-CM | POA: Diagnosis not present

## 2020-01-31 DIAGNOSIS — I517 Cardiomegaly: Secondary | ICD-10-CM | POA: Insufficient documentation

## 2020-01-31 DIAGNOSIS — I313 Pericardial effusion (noninflammatory): Secondary | ICD-10-CM | POA: Insufficient documentation

## 2020-01-31 DIAGNOSIS — N179 Acute kidney failure, unspecified: Secondary | ICD-10-CM | POA: Diagnosis not present

## 2020-01-31 DIAGNOSIS — M1990 Unspecified osteoarthritis, unspecified site: Secondary | ICD-10-CM | POA: Insufficient documentation

## 2020-01-31 DIAGNOSIS — C50412 Malignant neoplasm of upper-outer quadrant of left female breast: Secondary | ICD-10-CM | POA: Insufficient documentation

## 2020-01-31 DIAGNOSIS — Z833 Family history of diabetes mellitus: Secondary | ICD-10-CM | POA: Insufficient documentation

## 2020-01-31 DIAGNOSIS — C773 Secondary and unspecified malignant neoplasm of axilla and upper limb lymph nodes: Secondary | ICD-10-CM | POA: Diagnosis not present

## 2020-01-31 DIAGNOSIS — N3289 Other specified disorders of bladder: Secondary | ICD-10-CM | POA: Insufficient documentation

## 2020-01-31 DIAGNOSIS — R339 Retention of urine, unspecified: Secondary | ICD-10-CM | POA: Insufficient documentation

## 2020-01-31 DIAGNOSIS — Z8041 Family history of malignant neoplasm of ovary: Secondary | ICD-10-CM | POA: Insufficient documentation

## 2020-01-31 DIAGNOSIS — Z7289 Other problems related to lifestyle: Secondary | ICD-10-CM | POA: Insufficient documentation

## 2020-01-31 LAB — CBC WITH DIFFERENTIAL/PLATELET
Abs Immature Granulocytes: 0.05 10*3/uL (ref 0.00–0.07)
Basophils Absolute: 0 10*3/uL (ref 0.0–0.1)
Basophils Relative: 0 %
Eosinophils Absolute: 2.4 10*3/uL — ABNORMAL HIGH (ref 0.0–0.5)
Eosinophils Relative: 25 %
HCT: 24.4 % — ABNORMAL LOW (ref 36.0–46.0)
Hemoglobin: 8.2 g/dL — ABNORMAL LOW (ref 12.0–15.0)
Immature Granulocytes: 1 %
Lymphocytes Relative: 26 %
Lymphs Abs: 2.4 10*3/uL (ref 0.7–4.0)
MCH: 31.4 pg (ref 26.0–34.0)
MCHC: 33.6 g/dL (ref 30.0–36.0)
MCV: 93.5 fL (ref 80.0–100.0)
Monocytes Absolute: 0.8 10*3/uL (ref 0.1–1.0)
Monocytes Relative: 8 %
Neutro Abs: 3.7 10*3/uL (ref 1.7–7.7)
Neutrophils Relative %: 40 %
Platelets: 294 10*3/uL (ref 150–400)
RBC: 2.61 MIL/uL — ABNORMAL LOW (ref 3.87–5.11)
RDW: 13.3 % (ref 11.5–15.5)
WBC: 9.3 10*3/uL (ref 4.0–10.5)
nRBC: 0 % (ref 0.0–0.2)

## 2020-01-31 LAB — COMPREHENSIVE METABOLIC PANEL
ALT: 28 U/L (ref 0–44)
AST: 43 U/L — ABNORMAL HIGH (ref 15–41)
Albumin: 2.8 g/dL — ABNORMAL LOW (ref 3.5–5.0)
Alkaline Phosphatase: 68 U/L (ref 38–126)
Anion gap: 16 — ABNORMAL HIGH (ref 5–15)
BUN: 26 mg/dL — ABNORMAL HIGH (ref 8–23)
CO2: 22 mmol/L (ref 22–32)
Calcium: 8.5 mg/dL — ABNORMAL LOW (ref 8.9–10.3)
Chloride: 96 mmol/L — ABNORMAL LOW (ref 98–111)
Creatinine, Ser: 1.54 mg/dL — ABNORMAL HIGH (ref 0.44–1.00)
GFR calc Af Amer: 41 mL/min — ABNORMAL LOW (ref >60)
GFR calc non Af Amer: 35 mL/min — ABNORMAL LOW (ref >60)
Glucose, Bld: 79 mg/dL (ref 70–99)
Potassium: 3.8 mmol/L (ref 3.5–5.1)
Sodium: 134 mmol/L — ABNORMAL LOW (ref 135–145)
Total Bilirubin: 0.6 mg/dL (ref 0.3–1.2)
Total Protein: 7.8 g/dL (ref 6.5–8.1)

## 2020-01-31 NOTE — Progress Notes (Signed)
Pt is approved for the $1000 [employer] grant.  

## 2020-01-31 NOTE — Progress Notes (Signed)
Pharmacist Chemotherapy Monitoring - Initial Assessment    Anticipated start date: 02/05/20  Regimen:  . Are orders appropriate based on the patient's diagnosis, regimen, and cycle? No . Does the plan date match the patient's scheduled date? No . Is the sequencing of drugs appropriate? No . Are the premedications appropriate for the patient's regimen? Yes . Prior Authorization for treatment is: Approved o If applicable, is the correct biosimilar selected based on the patient's insurance? yes  Organ Function and Labs: Marland Kitchen Are dose adjustments needed based on the patient's renal function, hepatic function, or hematologic function? Yes . Are appropriate labs ordered prior to the start of patient's treatment? Yes . Other organ system assessment, if indicated: anthracyclines: Echo/ MUGA . The following baseline labs, if indicated, have been ordered: N/A  Dose Assessment: . Are the drug doses appropriate? Yes . Are the following correct: o Drug concentrations Yes o IV fluid compatible with drug Yes o Administration routes Yes o Timing of therapy Yes . If applicable, does the patient have documented access for treatment and/or plans for port-a-cath placement? yes . If applicable, have lifetime cumulative doses been properly documented and assessed? no Lifetime Dose Tracking  No doses have been documented on this patient for the following tracked chemicals: Doxorubicin, Epirubicin, Idarubicin, Daunorubicin, Mitoxantrone, Bleomycin, Oxaliplatin, Carboplatin, Liposomal Doxorubicin  o   Toxicity Monitoring/Prevention: . The patient has the following take home antiemetics prescribed: N/A . The patient has the following take home medications prescribed: N/A . Medication allergies and previous infusion related reactions, if applicable, have been reviewed and addressed. Yes . The patient's current medication list has been assessed for drug-drug interactions with their chemotherapy regimen. no  significant drug-drug interactions were identified on review.  Order Review: . Are the treatment plan orders signed? No . Is the patient scheduled to see a provider prior to their treatment? Yes  I verify that I have reviewed each item in the above checklist and answered each question accordingly.  [name] 01/31/2020 8:27 AM

## 2020-02-01 ENCOUNTER — Encounter (HOSPITAL_BASED_OUTPATIENT_CLINIC_OR_DEPARTMENT_OTHER): Payer: Medicaid Other | Attending: Internal Medicine | Admitting: Internal Medicine

## 2020-02-01 DIAGNOSIS — I129 Hypertensive chronic kidney disease with stage 1 through stage 4 chronic kidney disease, or unspecified chronic kidney disease: Secondary | ICD-10-CM | POA: Insufficient documentation

## 2020-02-01 DIAGNOSIS — Z6841 Body Mass Index (BMI) 40.0 and over, adult: Secondary | ICD-10-CM | POA: Diagnosis not present

## 2020-02-01 DIAGNOSIS — I890 Lymphedema, not elsewhere classified: Secondary | ICD-10-CM | POA: Diagnosis not present

## 2020-02-01 DIAGNOSIS — N189 Chronic kidney disease, unspecified: Secondary | ICD-10-CM | POA: Insufficient documentation

## 2020-02-01 DIAGNOSIS — L97221 Non-pressure chronic ulcer of left calf limited to breakdown of skin: Secondary | ICD-10-CM | POA: Insufficient documentation

## 2020-02-01 DIAGNOSIS — Z853 Personal history of malignant neoplasm of breast: Secondary | ICD-10-CM | POA: Diagnosis not present

## 2020-02-01 DIAGNOSIS — Z9012 Acquired absence of left breast and nipple: Secondary | ICD-10-CM | POA: Diagnosis not present

## 2020-02-01 NOTE — Progress Notes (Addendum)
[name] ([id number]) Visit Report for 02/01/2020 Arrival Information Details Patient Name: Date of Service: [name] 02/01/2020 11:00 A M Medical Record Number: [id number] Patient Account Number: [id number] Date of Birth/Sex: Treating RN: 03-Aug-1956 (64 y.o. [name] Primary Care Daiquan Resnik: [name] Other Clinician: Referring Eleina Jergens: Treating Cian Costanzo/Extender: Erasmo Leventhal, Cammie Weeks in Treatment: 2 Visit Information History Since Last Visit Added or deleted any medications: Yes Patient Arrived: Wheel Chair Any new allergies or adverse reactions: No Arrival Time: 12:02 Had a fall or experienced change in No Accompanied By: sister activities of daily living that may affect Transfer Assistance: None risk of falls: Patient Requires Transmission-Based Precautions: No Signs or symptoms of abuse/neglect since last visito No Patient Has Alerts: No Hospitalized since last visit: Yes Implantable device outside of the clinic excluding No cellular tissue based products placed in the center since last visit: Has Dressing in Place as Prescribed: No Pain Present Now: No Electronic Signature(s) Signed: 02/01/2020 4:22:54 PM By: Baruch Gouty RN, BSN Entered By: Baruch Gouty on 02/01/2020 12:04:24 -------------------------------------------------------------------------------- Clinic Level of Care Assessment Details Patient Name: Date of Service: [name] 02/01/2020 11:00 A M Medical Record Number: [id number] Patient Account Number: [id number] Date of Birth/Sex: Treating RN: 1955-11-27 (64 y.o. [name] Primary Care Anaih Brander: [name] Other Clinician: Referring Heela Heishman: Treating Taylon Coole/Extender: Erasmo Leventhal, Cammie Weeks in Treatment: 2 Clinic Level of Care Assessment Items TOOL 4 Quantity Score X- 1 0 Use when only an EandM is performed on FOLLOW-UP visit ASSESSMENTS - Nursing Assessment / Reassessment X- 1  10 Reassessment of Co-morbidities (includes updates in patient status) X- 1 5 Reassessment of Adherence to Treatment Plan ASSESSMENTS - Wound and Skin A ssessment / Reassessment X - Simple Wound Assessment / Reassessment - one wound 1 5 []  - 0 Complex Wound Assessment / Reassessment - multiple wounds []  - 0 Dermatologic / Skin Assessment (not related to wound area) ASSESSMENTS - Focused Assessment X- 1 5 Circumferential Edema Measurements - multi extremities []  - 0 Nutritional Assessment / Counseling / Intervention []  - 0 Lower Extremity Assessment (monofilament, tuning fork, pulses) []  - 0 Peripheral Arterial Disease Assessment (using hand held doppler) ASSESSMENTS - Ostomy and/or Continence Assessment and Care []  - 0 Incontinence Assessment and Management []  - 0 Ostomy Care Assessment and Management (repouching, etc.) PROCESS - Coordination of Care X - Simple Patient / Family Education for ongoing care 1 15 []  - 0 Complex (extensive) Patient / Family Education for ongoing care X- 1 10 Staff obtains Programmer, systems, Records, T Results / Process Orders est []  - 0 Staff telephones HHA, Nursing Homes / Clarify orders / etc []  - 0 Routine Transfer to another Facility (non-emergent condition) []  - 0 Routine Hospital Admission (non-emergent condition) []  - 0 New Admissions / Biomedical engineer / Ordering NPWT Apligraf, etc. , []  - 0 Emergency Hospital Admission (emergent condition) X- 1 10 Simple Discharge Coordination []  - 0 Complex (extensive) Discharge Coordination PROCESS - Special Needs []  - 0 Pediatric / Minor Patient Management []  - 0 Isolation Patient Management []  - 0 Hearing / Language / Visual special needs []  - 0 Assessment of Community assistance (transportation, D/C planning, etc.) []  - 0 Additional assistance / Altered mentation []  - 0 Support Surface(s) Assessment (bed, cushion, seat, etc.) INTERVENTIONS - Wound Cleansing / Measurement X - Simple  Wound Cleansing - one wound 1 5 []  - 0 Complex Wound Cleansing - multiple wounds X- 1 5 Wound Imaging (  photographs - any number of wounds) []  - 0 Wound Tracing (instead of photographs) X- 1 5 Simple Wound Measurement - one wound []  - 0 Complex Wound Measurement - multiple wounds INTERVENTIONS - Wound Dressings []  - 0 Small Wound Dressing one or multiple wounds []  - 0 Medium Wound Dressing one or multiple wounds []  - 0 Large Wound Dressing one or multiple wounds []  - 0 Application of Medications - topical []  - 0 Application of Medications - injection INTERVENTIONS - Miscellaneous []  - 0 External ear exam []  - 0 Specimen Collection (cultures, biopsies, blood, body fluids, etc.) []  - 0 Specimen(s) / Culture(s) sent or taken to Lab for analysis []  - 0 Patient Transfer (multiple staff / Civil Service fast streamer / Similar devices) []  - 0 Simple Staple / Suture removal (25 or less) []  - 0 Complex Staple / Suture removal (26 or more) []  - 0 Hypo / Hyperglycemic Management (close monitor of Blood Glucose) []  - 0 Ankle / Brachial Index (ABI) - do not check if billed separately X- 1 5 Vital Signs Has the patient been seen at the hospital within the last three years: Yes Total Score: 80 Level Of Care: New/Established - Level 3 Electronic Signature(s) Signed: 02/01/2020 4:23:17 PM By: Kela Millin Entered By: Kela Millin on 02/01/2020 12:47:06 -------------------------------------------------------------------------------- Encounter Discharge Information Details Patient Name: Date of Service: [name] 02/01/2020 11:00 A M Medical Record Number: [id number] Patient Account Number: [id number] Date of Birth/Sex: Treating RN: [birth year] (64 y.o. [name] Primary Care Airyn Ellzey: [name] Other Clinician: Referring Lazaria Schaben: Treating Matha Masse/Extender: Erasmo Leventhal, Cammie Weeks in Treatment: 2 Encounter Discharge Information Items Discharge Condition:  Stable Ambulatory Status: Ambulatory Discharge Destination: Home Transportation: Private Auto Accompanied By: self Schedule Follow-up Appointment: Yes Clinical Summary of Care: Patient Declined Electronic Signature(s) Signed: 02/01/2020 4:22:54 PM By: Baruch Gouty RN, BSN Entered By: Baruch Gouty on 02/01/2020 16:21:37 -------------------------------------------------------------------------------- Lower Extremity Assessment Details Patient Name: Date of Service: [name] 02/01/2020 11:00 A M Medical Record Number: [id number] Patient Account Number: [id number] Date of Birth/Sex: Treating RN: 18-Dec-1955 (64 y.o. [name] Primary Care Madolyn Ackroyd: [name] Other Clinician: Referring Darleene Cumpian: Treating Messi Twedt/Extender: Erasmo Leventhal, Cammie Weeks in Treatment: 2 Edema Assessment Assessed: [Left: No] [Right: No] Edema: [Left: Ye] [Right: s] Calf Left: Right: Point of Measurement: cm From Medial Instep 53.4 cm cm Ankle Left: Right: Point of Measurement: cm From Medial Instep 29.8 cm cm Vascular Assessment Pulses: Dorsalis Pedis Palpable: [Left:Yes] Electronic Signature(s) Signed: 02/01/2020 4:22:54 PM By: Baruch Gouty RN, BSN Entered By: Baruch Gouty on 02/01/2020 12:06:46 -------------------------------------------------------------------------------- Multi Wound Chart Details Patient Name: Date of Service: [name] 02/01/2020 11:00 A M Medical Record Number: [id number] Patient Account Number: [id number] Date of Birth/Sex: Treating RN: October 11, 1955 (64 y.o. [name] Primary Care Derin Granquist: [name] Other Clinician: Referring Lexia Vandevender: Treating Ranada Vigorito/Extender: Erasmo Leventhal, Cammie Weeks in Treatment: 2 Vital Signs Height(in): 61 Pulse(bpm): 114 Weight(lbs): 300 Blood Pressure(mmHg): 154/82 Body Mass Index(BMI): 57 Temperature(F): 98.0 Respiratory Rate(breaths/min): 20 Photos: [1:No Photos Left,  Posterior Lower Leg] [N/A:N/A N/A] Wound Location: [1:Gradually Appeared] [N/A:N/A] Wounding Event: [1:Lymphedema] [N/A:N/A] Primary Etiology: [1:Lymphedema, Hypertension] [N/A:N/A] Comorbid History: [1:12/16/2019] [N/A:N/A] Date Acquired: [1:2] [N/A:N/A] Weeks of Treatment: [1:Healed - Epithelialized] [N/A:N/A] Wound Status: [1:0x0x0] [N/A:N/A] Measurements L x W x D (cm) [1:0] [N/A:N/A] A (cm) : rea [1:0] [N/A:N/A] Volume (cm) : [1:100.00%] [N/A:N/A] % Reduction in A rea: [1:100.00%] [N/A:N/A] % Reduction in Volume: [1:Full Thickness  Without Exposed] [N/A:N/A] Classification: [1:Support Structures None Present] [N/A:N/A] Exudate Amount: [1:Indistinct, nonvisible] [N/A:N/A] Wound Margin: [1:None Present (0%)] [N/A:N/A] Granulation Amount: [1:None Present (0%)] [N/A:N/A] Necrotic Amount: [1:Fascia: No] [N/A:N/A] Exposed Structures: [1:Fat Layer (Subcutaneous Tissue) Exposed: No Tendon: No Muscle: No Joint: No Bone: No Large (67-100%)] [N/A:N/A] Treatment Notes Electronic Signature(s) Signed: 02/04/2020 5:22:40 PM By: Linton Ham MD Signed: 02/04/2020 5:35:09 PM By: Kela Millin Entered By: Linton Ham on 02/03/2020 09:05:56 -------------------------------------------------------------------------------- Multi-Disciplinary Care Plan Details Patient Name: Date of Service: [name] 02/01/2020 11:00 A M Medical Record Number: [id number] Patient Account Number: [id number] Date of Birth/Sex: Treating RN: [birth year] (64 y.o. [name] Primary Care Yosselin Zoeller: [name] Other Clinician: Referring Adel Neyer: Treating Breionna Punt/Extender: Erasmo Leventhal, Cammie Weeks in Treatment: 2 Active Inactive Electronic Signature(s) Signed: 02/01/2020 4:23:17 PM By: Kela Millin Entered By: Kela Millin on 02/01/2020 12:46:14 -------------------------------------------------------------------------------- Pain Assessment Details Patient Name: Date of  Service: [name] 02/01/2020 11:00 A M Medical Record Number: [id number] Patient Account Number: [id number] Date of Birth/Sex: Treating RN: 1956-07-20 (64 y.o. [name] Primary Care Disha Cottam: [name] Other Clinician: Referring Letita Prentiss: Treating Ramin Zoll/Extender: Erasmo Leventhal, Cammie Weeks in Treatment: 2 Active Problems Location of Pain Severity and Description of Pain Patient Has Paino No Site Locations Rate the pain. Current Pain Level: 0 Pain Management and Medication Current Pain Management: Electronic Signature(s) Signed: 02/01/2020 4:22:54 PM By: Baruch Gouty RN, BSN Entered By: Baruch Gouty on 02/01/2020 12:05:42 -------------------------------------------------------------------------------- Wound Assessment Details Patient Name: Date of Service: [name] 02/01/2020 11:00 A M Medical Record Number: [id number] Patient Account Number: [id number] Date of Birth/Sex: Treating RN: February 16, 1956 (64 y.o. [name] Primary Care Atina Feeley: [name] Other Clinician: Referring Floraine Buechler: Treating Nathanyl Andujo/Extender: Erasmo Leventhal, Cammie Weeks in Treatment: 2 Wound Status Wound Number: 1 Primary Etiology: Lymphedema Wound Location: Left, Posterior Lower Leg Wound Status: Healed - Epithelialized Wounding Event: Gradually Appeared Comorbid History: Lymphedema, Hypertension Date Acquired: 12/16/2019 Weeks Of Treatment: 2 Clustered Wound: No Wound Measurements Length: (cm) Width: (cm) Depth: (cm) Area: (cm) Volume: (cm) 0 % Reduction in Area: 100% 0 % Reduction in Volume: 100% 0 Epithelialization: Large (67-100%) 0 Tunneling: No 0 Undermining: No Wound Description Classification: Full Thickness Without Exposed Support Structures Wound Margin: Indistinct, nonvisible Exudate Amount: None Present Foul Odor After Cleansing: No Slough/Fibrino No Wound Bed Granulation Amount: None Present (0%) Exposed  Structure Necrotic Amount: None Present (0%) Fascia Exposed: No Fat Layer (Subcutaneous Tissue) Exposed: No Tendon Exposed: No Muscle Exposed: No Joint Exposed: No Bone Exposed: No Electronic Signature(s) Signed: 02/01/2020 4:22:54 PM By: Baruch Gouty RN, BSN Entered By: Baruch Gouty on 02/01/2020 12:08:54 -------------------------------------------------------------------------------- [place] Details Patient Name: Date of Service: [name] 02/01/2020 11:00 A M Medical Record Number: [id number] Patient Account Number: [id number] Date of Birth/Sex: Treating RN: [birth year] (64 y.o. [name] Primary Care Dmitry Macomber: [name] Other Clinician: Referring Amilya Haver: Treating Paisley Grajeda/Extender: Erasmo Leventhal, Cammie Weeks in Treatment: 2 Vital Signs Time Taken: 12:03 Temperature (F): 98.0 Height (in): 61 Pulse (bpm): 114 Source: Stated Respiratory Rate (breaths/min): 20 Weight (lbs): 300 Blood Pressure (mmHg): 154/82 Source: Stated Reference Range: 80 - 120 mg / dl Body Mass Index (BMI): 56.7 Electronic Signature(s) Signed: 02/01/2020 4:22:54 PM By: Baruch Gouty RN, BSN Entered By: Baruch Gouty on 02/01/2020 12:04:11

## 2020-02-04 MED FILL — Dexamethasone Sodium Phosphate Inj 100 MG/10ML: INTRAMUSCULAR | Qty: 1 | Status: AC

## 2020-02-04 MED FILL — Fosaprepitant Dimeglumine For IV Infusion 150 MG (Base Eq): INTRAVENOUS | Qty: 5 | Status: AC

## 2020-02-04 NOTE — Progress Notes (Signed)
TEVIS, CONGER ([id number]) Visit Report for 02/01/2020 HPI Details Patient Name: Date of Service: [name] 02/01/2020 11:00 A M Medical Record Number: [id number] Patient Account Number: [id number] Date of Birth/Sex: Treating RN: 1956-02-03 (64 y.o. [name] Primary Care Provider: [name] Other Clinician: Referring Provider: Treating Provider/Extender: Erasmo Leventhal, Cammie Weeks in Treatment: 2 History of Present Illness HPI Description: ADMISSION 01/15/2020 Patient is a pleasant 64 year old woman who was referred here for a wound on the posterior aspect of her left calf just above the Achilles area. She basically says that she noticed a wet area on her posterior calf about a month ago. Since then it has been draining and stinging. She saw her primary doctor on 01/02/2020 and was given a course of Augmentin after a shot of Rocephin in the office. Since then she has been using Neosporin and a Band-Aid but the Band-Aid rarely stays on because of the drainage. She does not have a prior wound history. Past medical history she is not a diabetic. She has a recent diagnosis of breast cancer status post left mastectomy a few weeks ago. She is supposed to start chemotherapy. She has morbid obesity, venous insufficiency chronic renal failure hypertension and falls. There is also some suggestion of alcohol abuse. Patient denies the latter. ABI in our clinic on the left was 1.07 6/4; Patient's wound on the posterior left calf is healed. Stage 3 lymphedema.wound may have had an infectious cause initially She does not have stocking and has not worn them previously Electronic Signature(s) Signed: 02/04/2020 5:22:40 PM By: Linton Ham MD Entered By: Linton Ham on 02/03/2020 09:23:54 -------------------------------------------------------------------------------- Physical Exam Details Patient Name: Date of Service: [name] [name] 02/01/2020 11:00 A M Medical Record  Number: [id number] Patient Account Number: [id number] Date of Birth/Sex: Treating RN: [birth year] (64 y.o. [name] Primary Care Provider: [name] Other Clinician: Referring Provider: Treating Provider/Extender: Erasmo Leventhal, Cammie Weeks in Treatment: 2 Notes Wound Exam. Stage 3 lymphedema with secondary skin changes but surprisingly the wound is closed. No evidence of infection. Electronic Signature(s) Signed: 02/04/2020 5:22:40 PM By: Linton Ham MD Entered By: Linton Ham on 02/03/2020 09:26:14 -------------------------------------------------------------------------------- Physician Orders Details Patient Name: Date of Service: [name] [name] 02/01/2020 11:00 A M Medical Record Number: [id number] Patient Account Number: [id number] Date of Birth/Sex: Treating RN: 06-01-1956 (64 y.o. [name] Primary Care Provider: [name] Other Clinician: Referring Provider: Treating Provider/Extender: Erasmo Leventhal, Cammie Weeks in Treatment: 2 Verbal / Phone Orders: No Diagnosis Coding ICD-10 Coding Code Description I89.0 Lymphedema, not elsewhere classified L97.221 Non-pressure chronic ulcer of left calf limited to breakdown of skin Discharge From [facility] Discharge from [place] - call if wound re-opens or new wound develops Skin Barriers/Peri-Wound Care Moisturizing lotion - at night Edema Control Other: - place stocking on in the morning and remove at night Notes order stockings Electronic Signature(s) Signed: 02/01/2020 4:23:17 PM By: Kela Millin Signed: 02/04/2020 5:22:40 PM By: Linton Ham MD Entered By: Kela Millin on 02/01/2020 12:36:52 -------------------------------------------------------------------------------- Problem List Details Patient Name: Date of Service: [name] [name] 02/01/2020 11:00 A M Medical Record Number: [id number] Patient Account Number: [id number] Date of  Birth/Sex: Treating RN: April 11, 1956 (64 y.o. [name] Primary Care Provider: [name] Other Clinician: Referring Provider: Treating Provider/Extender: Erasmo Leventhal, Cammie Weeks in Treatment: 2 Active Problems ICD-10 Encounter Code Description Active Date MDM Diagnosis I89.0 Lymphedema, not elsewhere classified 01/15/2020 No  Yes L97.221 Non-pressure chronic ulcer of left calf limited to breakdown of skin 01/15/2020 No Yes Inactive Problems Resolved Problems Electronic Signature(s) Signed: 02/04/2020 5:22:40 PM By: Linton Ham MD Previous Signature: 02/01/2020 4:23:17 PM Version By: Kela Millin Entered By: Linton Ham on 02/03/2020 09:05:42 -------------------------------------------------------------------------------- Progress Note Details Patient Name: Date of Service: [name] [name] 02/01/2020 11:00 A M Medical Record Number: [id number] Patient Account Number: [id number] Date of Birth/Sex: Treating RN: 1955-12-22 (64 y.o. [name] Primary Care Provider: [name] Other Clinician: Referring Provider: Treating Provider/Extender: Erasmo Leventhal, Cammie Weeks in Treatment: 2 Subjective History of Present Illness (HPI) ADMISSION 01/15/2020 Patient is a pleasant 64 year old woman who was referred here for a wound on the posterior aspect of her left calf just above the Achilles area. She basically says that she noticed a wet area on her posterior calf about a month ago. Since then it has been draining and stinging. She saw her primary doctor on 01/02/2020 and was given a course of Augmentin after a shot of Rocephin in the office. Since then she has been using Neosporin and a Band-Aid but the Band-Aid rarely stays on because of the drainage. She does not have a prior wound history. Past medical history she is not a diabetic. She has a recent diagnosis of breast cancer status post left mastectomy a few weeks ago. She is supposed to  start chemotherapy. She has morbid obesity, venous insufficiency chronic renal failure hypertension and falls. There is also some suggestion of alcohol abuse. Patient denies the latter. ABI in our clinic on the left was 1.07 6/4; Patient's wound on the posterior left calf is healed. Stage 3 lymphedema.wound may have had an infectious cause initially She does not have stocking and has not worn them previously Objective Constitutional Vitals Time Taken: 12:03 PM, Height: 61 in, Source: Stated, Weight: 300 lbs, Source: Stated, BMI: 56.7, Temperature: 98.0 F, Pulse: 114 bpm, Respiratory Rate: 20 breaths/min, Blood Pressure: 154/82 mmHg. Integumentary (Hair, Skin) Wound #1 status is Healed - Epithelialized. Original cause of wound was Gradually Appeared. The wound is located on the Left,Posterior Lower Leg. The wound measures 0cm length x 0cm width x 0cm depth; 0cm^2 area and 0cm^3 volume. There is no tunneling or undermining noted. There is a none present amount of drainage noted. The wound margin is indistinct and nonvisible. There is no granulation within the wound bed. There is no necrotic tissue within the wound bed. Assessment Active Problems ICD-10 Lymphedema, not elsewhere classified Non-pressure chronic ulcer of left calf limited to breakdown of skin Plan Discharge From [facility] Services: Discharge from [place] - call if wound re-opens or new wound develops Skin Barriers/Peri-Wound Care: Moisturizing lotion - at night Edema Control: Other: - place stocking on in the morning and remove at night General Notes: order stockings 1 the patient can be discharged. Instructions given for bilateral l/e stockings 2 At some point patient is likely to require compression pumps or lymphedema clinic referrral Electronic Signature(s) Signed: 02/04/2020 5:22:40 PM By: Linton Ham MD Entered By: Linton Ham on 02/03/2020  09:28:36 -------------------------------------------------------------------------------- SuperBill Details Patient Name: Date of Service: [name] [name] 02/01/2020 Medical Record Number: [id number] Patient Account Number: [id number] Date of Birth/Sex: Treating RN: 24-May-1956 (64 y.o. [name] Primary Care Provider: [name] Other Clinician: Referring Provider: Treating Provider/Extender: Erasmo Leventhal, Cammie Weeks in Treatment: 2 Diagnosis Coding ICD-10 Codes Code Description I89.0 Lymphedema, not elsewhere classified L97.221 Non-pressure chronic ulcer of left calf  limited to breakdown of skin Facility Procedures CPT4 Code: [id number] Description: 99213 - WOUND CARE VISIT-LEV 3 EST PT Modifier: Quantity: 1 Physician Procedures : CPT4 Code Description Modifier [id number] [phone] - WC PHYS LEVEL 2 - EST PT ICD-10 Diagnosis Description I89.0 Lymphedema, not elsewhere classified L97.221 Non-pressure chronic ulcer of left calf limited to breakdown of skin Quantity: 1 Electronic Signature(s) Signed: 02/04/2020 5:22:40 PM By: Linton Ham MD Previous Signature: 02/01/2020 4:23:17 PM Version By: Kela Millin Entered By: Linton Ham on 02/03/2020 09:28:55

## 2020-02-04 NOTE — Progress Notes (Signed)
[place]  [phone] [phone]     ID: [name] DOB: 05-Jan-1956  MR#: [id number]  VXB#:[id number]  Patient Care Team: [name], MD as PCP - General (Family Medicine) Mauro Kaufmann, RN as Oncology Nurse Navigator Rockwell Germany, RN as Oncology Nurse Navigator [name], MD as Consulting Physician (General Surgery) Meri Pelot, Virgie Dad, MD as Consulting Physician (Oncology) Kyung Rudd, MD as Consulting Physician (Radiation Oncology) Edrick Kins, DPM as Consulting Physician (Podiatry) Chauncey Cruel, MD OTHER MD:  CHIEF COMPLAINT: estrogen receptor positive breast cancer  CURRENT TREATMENT: Adjuvant chemotherapy    INTERVAL HISTORY: [name] returns today for follow up of her estrogen receptor positive breast cancer.  [name] was to have received her first dose of chemotherapy (dose-reduced AC) 01/22/2020 but was found to be hypotensive; BP did not respond to fluid bolus and her creatinine came back at >6, so she was directed to the ED leading to this admission. Her BP medications were discontinued and her creatinine improved. Multiple electrolyte imbalances were corrected. She was also found to have a positive blood culture for enterococcus; she received ampicillin for this.  Her most recent echocardiogram on 11/26/2019 showed an ejection fraction of 65-70%.   REVIEW OF SYSTEMS: [name] tells me she is feeling fine.  She is cooking, doing some cleaning, making her bed, and walking outside.  Her appetite is better and her bowel movements are back to normal.  She is eager to start treatment today.   HISTORY OF CURRENT ILLNESS: From the original intake note:  [name] (pronounced "yellow") had routine screening mammography on 10/24/2019 showing a possible abnormality in the left breast. She underwent left breast ultrasonography at [facility] on 11/05/2019 showing: suspicious 3.8 cm mass in left breast at 3  o'clock; five abnormal lymph nodes.  Accordingly on 11/12/2019 she proceeded to biopsy of the left breast area in question as well as a lymph node. The pathology from this procedure ([id number]) showed: invasive ductal carcinoma with prominent inflammatory response, grade 3. Prognostic indicators significant for: estrogen receptor, 100% positive and progesterone receptor, 50% positive, both with strong staining intensity. Proliferation marker Ki67 at 70%. HER2 negative by immunohistochemistry (0).  The biopsied lymph node confirmed metastatic breast carcinoma.  The patient's subsequent history is as detailed below.   PAST MEDICAL HISTORY: Past Medical History:  Diagnosis Date  . Arthritis   . Cancer ([place]) 11/2019   Left breast  . Diverticulitis   . Family history of ovarian cancer   . Heart murmur    11/26/19 echo: Mild AS. AV mean gradient 12.0 mmHg; however, LVOT gradient 8 mmHg with intracvitary gradient-significant AS is not suspected  . Hypertension     PAST SURGICAL HISTORY: Past Surgical History:  Procedure Laterality Date  . MASTECTOMY WITH AXILLARY LYMPH NODE DISSECTION Left 12/19/2019   Procedure: LEFT MASTECTOMY WITH AXILLARY LYMPH NODE DISSECTION;  Surgeon: [name], MD;  Location: [facility];  Service: General;  Laterality: Left;  Marland Kitchen MULTIPLE EXTRACTIONS WITH ALVEOLOPLASTY Bilateral 12/14/2019   Procedure: MULTIPLE EXTRACTION WITH ALVEOLOPLASTY;  Surgeon: [name], DDS;  Location: [facility];  Service: Oral Surgery;  Laterality: Bilateral;  . PORTACATH PLACEMENT N/A 12/19/2019   Procedure: INSERTION PORT-A-CATH WITH ULTRASOUND GUIDANCE;  Surgeon: [name], MD;  Location: [facility];  Service: General;  Laterality: N/A;  . RADIAL HEAD ARTHROPLASTY Right 06/25/2019   Procedure: RIGHT RADIAL HEAD ARTHROPLASTY;  Surgeon: [name], MD;  Location: [facility];  Service: Orthopedics;  Laterality: Right;  . TUBAL LIGATION      FAMILY HISTORY: Family History  Problem  Relation Age of Onset  . Hypertension Mother   . Dementia Mother   . Ovarian cancer Half-Sister 42  . Diabetes Half-Sister   . Stroke Half-Sister   Her father died at age 33; she is unsure of the cause of death. Her mother is age 25 as of 10/2019.  The patient has two brothers and four sisters. She reports ovarian cancer in her sister at age 39. She denies a family history of breast, prostate, or pancreatic cancer.   GYNECOLOGIC HISTORY:  No LMP recorded. Patient is postmenopausal. Menarche: 64 years old Age at first live birth: 64 years old [name] 2 LMP around age 13 Contraceptive: used previously HRT never used  Hysterectomy? no BSO? no   SOCIAL HISTORY: (updated 10/2019)  [name] retired from working at the [employer]. She is divorced. She lives at home alone but during the pandemic her sister Manus Gunning and mother Raford Pitcher lived in the same home. Daughter [name], age 69, lives in [place] and is handicapped. [name], age 61, is a Biomedical scientist here in [place]. [place]ia has two granddaughters. She attends a local [employer].    ADVANCED DIRECTIVES: not in place; at the 11/21/2019 visit the patient was given the appropriate documents to complete and notarized at her discretion.  She expressed spontaneously that she would not want to be resuscitated in case of a terminal event.    HEALTH MAINTENANCE: Social History   Tobacco Use  . Smoking status: Never Smoker  . Smokeless tobacco: Never Used  Substance Use Topics  . Alcohol use: Yes    Alcohol/week: 3.0 standard drinks    Types: 1 Cans of beer, 2 Shots of liquor per week    Comment: daily  . Drug use: Yes    Types: Marijuana     Colonoscopy: never done  PAP: "1980?"  Bone density: never done   Allergies  Allergen Reactions  . Amlodipine Swelling    BLE edema    No current outpatient medications on file.   No current facility-administered medications for this visit.    OBJECTIVE: African-American woman examined in a  wheelchair  Vitals:   02/05/20 0837  BP: 130/76  Pulse: (!) 103  Resp: 19  Temp: 98.3 F (36.8 C)  SpO2: 99%     Body mass index is 58.08 kg/m.   Wt Readings from Last 3 Encounters:  02/05/20 (!) 307 lb 6.4 oz (139.4 kg)  01/27/20 (!) 314 lb 9.5 oz (142.7 kg)  01/17/20 (!) 303 lb 14.4 oz (137.8 kg)      ECOG FS:2 - Symptomatic, <50% confined to bed  Sclerae unicteric, EOMs intact Wearing a mask No cervical or supraclavicular adenopathy Lungs no rales or rhonchi Heart regular rate and rhythm Abd soft, obese, nontender, positive bowel sounds MSK no focal spinal tenderness, no upper extremity lymphedema Neuro: nonfocal, well oriented, appropriate affect Breasts: Deferred   LAB RESULTS:  CMP     Component Value Date/Time   NA 134 (L) 01/31/2020 1055   NA 135 01/02/2020 1710   K 3.8 01/31/2020 1055   CL 96 (L) 01/31/2020 1055   CO2 22 01/31/2020 1055   GLUCOSE 79 01/31/2020 1055   BUN 26 (H) 01/31/2020 1055   BUN 25 01/02/2020 1710   CREATININE 1.54 (H) 01/31/2020 1055   CREATININE 1.59 (H) 11/21/2019 0908   CALCIUM 8.5 (L) 01/31/2020 1055  PROT 7.8 01/31/2020 1055   PROT 8.5 06/15/2019 1622   ALBUMIN 2.8 (L) 01/31/2020 1055   ALBUMIN 4.3 06/15/2019 1622   AST 43 (H) 01/31/2020 1055   AST 61 (H) 11/21/2019 0908   ALT 28 01/31/2020 1055   ALT 32 11/21/2019 0908   ALKPHOS 68 01/31/2020 1055   BILITOT 0.6 01/31/2020 1055   BILITOT 0.5 11/21/2019 0908   GFRNONAA 35 (L) 01/31/2020 1055   GFRNONAA 34 (L) 11/21/2019 0908   GFRAA 41 (L) 01/31/2020 1055   GFRAA 39 (L) 11/21/2019 0908    No results found for: TOTALPROTELP, ALBUMINELP, A1GS, A2GS, BETS, BETA2SER, GAMS, MSPIKE, SPEI  Lab Results  Component Value Date   WBC 9.3 01/31/2020   NEUTROABS 3.7 01/31/2020   HGB 8.2 (L) 01/31/2020   HCT 24.4 (L) 01/31/2020   MCV 93.5 01/31/2020   PLT 294 01/31/2020    No results found for: LABCA2  No components found for: ZOXWRU045  No results for input(s): INR  in the last 168 hours.  No results found for: LABCA2  No results found for: WUJ811  No results found for: BJY782  No results found for: NFA213  No results found for: CA2729  No components found for: HGQUANT  No results found for: CEA1 / No results found for: CEA1   No results found for: AFPTUMOR  No results found for: CHROMOGRNA  No results found for: KPAFRELGTCHN, LAMBDASER, KAPLAMBRATIO (kappa/lambda light chains)  No results found for: HGBA, HGBA2QUANT, HGBFQUANT, HGBSQUAN (Hemoglobinopathy evaluation)   No results found for: LDH  Lab Results  Component Value Date   IRON 111 01/22/2020   TIBC 231 (L) 01/22/2020   IRONPCTSAT 48 (H) 01/22/2020   (Iron and TIBC)  Lab Results  Component Value Date   FERRITIN 3000 (H) 11/10/2007    Urinalysis    Component Value Date/Time   COLORURINE YELLOW 01/22/2020 1601   APPEARANCEUR HAZY (A) 01/22/2020 1601   LABSPEC 1.009 01/22/2020 1601   PHURINE 5.0 01/22/2020 1601   GLUCOSEU NEGATIVE 01/22/2020 1601   HGBUR NEGATIVE 01/22/2020 1601   BILIRUBINUR NEGATIVE 01/22/2020 1601   KETONESUR NEGATIVE 01/22/2020 1601   PROTEINUR NEGATIVE 01/22/2020 1601   UROBILINOGEN 1.0 07/14/2012 1630   NITRITE NEGATIVE 01/22/2020 1601   LEUKOCYTESUR SMALL (A) 01/22/2020 1601     STUDIES: US RENAL  Result Date: 01/22/2020 CLINICAL DATA:  Acute renal failure. EXAM: RENAL / URINARY TRACT ULTRASOUND COMPLETE COMPARISON:  None. FINDINGS: Right Kidney: Renal measurements: 12.6 x 5.4 x 6.4 cm = volume: 224 mL . Echogenicity within normal limits. No mass or hydronephrosis visualized. Left Kidney: Renal measurements: 11.2 x 6.0 x 5.7 cm = volume: 202 mL. Echogenicity within normal limits. No mass or hydronephrosis visualized. Bladder: Appears normal for degree of bladder distention. Other: None. IMPRESSION: Normal renal ultrasound. Electronically Signed   By: Lajean Manes M.D.   On: 01/22/2020 14:46   ECHOCARDIOGRAM LIMITED  Result Date:  01/22/2020    ECHOCARDIOGRAM LIMITED REPORT   Patient Name:   AJLA MCGEACHY Date of Exam: 01/22/2020 Medical Rec #:  [id number]     Height:       61.0 in Accession #:    [id number]    Weight:       303.9 lb Date of Birth:  Dec 21, 1955    BSA:          2.256 m Patient Age:    64 years      BP:  92/60 mmHg Patient Gender: F             HR:           73 bpm. Exam Location:  Inpatient Procedure: Limited Echo Indications:    Pericardial effusion  History:        Patient has prior history of Echocardiogram examinations, most                 recent 11/26/2019. Risk Factors:Hypertension. Breast cancer, s/p                 mastectomy.  Sonographer:    Dustin Flock Referring Phys: [place]  1. Left ventricular ejection fraction, by estimation, is 65 to 70%. The left ventricle has normal function. The left ventricle has no regional wall motion abnormalities. There is mild left ventricular hypertrophy.  2. Right ventricular systolic function is normal. The right ventricular size is normal.  3. The inferior vena cava is normal in size with greater than 50% respiratory variability, suggesting right atrial pressure of 3 mmHg.  4. No pericardial effusion.  5. Limited echo with no doppler. FINDINGS  Left Ventricle: Left ventricular ejection fraction, by estimation, is 65 to 70%. The left ventricle has normal function. The left ventricle has no regional wall motion abnormalities. The left ventricular internal cavity size was normal in size. There is  mild left ventricular hypertrophy. Right Ventricle: The right ventricular size is normal. No increase in right ventricular wall thickness. Right ventricular systolic function is normal. Left Atrium: Left atrial size was normal in size. Right Atrium: Right atrial size was normal in size. Pericardium: There is no evidence of pericardial effusion. Venous: The inferior vena cava is normal in size with greater than 50% respiratory variability, suggesting right  atrial pressure of 3 mmHg. IAS/Shunts: No atrial level shunt detected by color flow Doppler.  LEFT VENTRICLE PLAX 2D LVIDd:         3.30 cm LVIDs:         1.80 cm LV PW:         1.50 cm LV IVS:        1.30 cm  Loralie Champagne MD Electronically signed by Loralie Champagne MD Signature Date/Time: 01/22/2020/3:19:05 PM    Final      ELIGIBLE FOR AVAILABLE RESEARCH PROTOCOL: AET  ASSESSMENT: 64 y.o. [place] woman status post left breast upper outer quadrant and left axillary lymph node biopsies 11/12/2019 for a clinical T2 N2, stage IIIA invasive ductal carcinoma, grade 3, estrogen and progesterone receptor positive, HER-2 not amplified, with an MIB-1 of 70%  (a) the biopsied axillary lymph node was positive  (1) genetics testing 11/29/2019 through the Breast Cancer STAT Panel offered by [facility] found no deleterious mutations in ATM, BRCA1, BRCA2, CDH1, CHEK2, PALB2, PTEN, STK11 and TP53.  Additional testing through the Common Hereditary Cancers Panel confirmed no deleterious mutations in APC, ATM, AXIN2, BARD1, BMPR1A, BRCA1, BRCA2, BRIP1, CDH1, CDK4, CDKN2A (p14ARF), CDKN2A (p16INK4a), CHEK2, CTNNA1, DICER1, EPCAM (Deletion/duplication testing only), GREM1 (promoter region deletion/duplication testing only), KIT, MEN1, MLH1, MSH2, MSH3, MSH6, MUTYH, NBN, NF1, NHTL1, PALB2, PDGFRA, PMS2, POLD1, POLE, PTEN, RAD50, RAD51C, RAD51D, RNF43, SDHB, SDHC, SDHD, SMAD4, SMARCA4. STK11, TP53, TSC1, TSC2, and VHL.  The following genes were evaluated for sequence changes only: SDHA and HOXB13 c.251G>A variant only.  (2) status post left modified radical mastectomy 12/19/2019 for a pT2 pN1, stage IIB invasive ductal carcinoma, grade 3, with negative margins.  (a) 1 of 13 axillary  lymph nodes removed was positive  (3) adjuvant chemotherapy will consist of cyclophosphamide and doxorubicin in dose dense fashion x4 starting 01/22/2020 followed by weekly paclitaxel x12  (a) baseline echocardiogram 11/26/2019 shows an ejection  fraction in the 65-70% range.  (b) first cycle of cyclophosphamide and doxorubicin dose reduced to assess tolerance  (4) adjuvant radiation as appropriate  (5) antiestrogens to follow   PLAN: Kassondra is ready to start her chemotherapy today.  I have dose reduced the first cycle just to make sure she will be able to tolerate it well.  She is coming back in 2 days of course for shots and I have also set her up for some fluids and a quick visit just to make sure everything is okay.  We will then see her for a nadir visit next week.  She tells me her sister is currently visiting, her son is 4 blocks away, and she has a niece who is a 10-minute drive from her house so she says if she needs help she has plenty of people who can help  She has a good understanding of how to take her supportive medicines.  She has all of them on hand.  She has the routing sheet and has had that reviewed by the chemotherapy teaching nurse twice and by myself once  Total encounter time 30 minutes.Sarajane Jews C. Shanicqua Coldren, MD 02/05/2020 8:51 AM Medical Oncology and Hematology [facility] [place], [place] [address] Tel. [phone]    Fax. [phone]   This document serves as a record of services personally performed by Lurline Del, MD. It was created on his behalf by Wilburn Mylar, a trained medical scribe. The creation of this record is based on the scribe's personal observations and the provider's statements to them.   I, Lurline Del MD, have reviewed the above documentation for accuracy and completeness, and I agree with the above.   *Total Encounter Time as defined by the Centers for Medicare and Medicaid Services includes, in addition to the face-to-face time of a patient visit (documented in the note above) non-face-to-face time: obtaining and reviewing outside history, ordering and reviewing medications, tests or procedures, care coordination (communications with other  health care professionals or caregivers) and documentation in the medical record.

## 2020-02-05 ENCOUNTER — Other Ambulatory Visit: Payer: Self-pay

## 2020-02-05 ENCOUNTER — Inpatient Hospital Stay: Payer: Medicaid Other

## 2020-02-05 ENCOUNTER — Encounter: Payer: Self-pay | Admitting: *Deleted

## 2020-02-05 ENCOUNTER — Inpatient Hospital Stay (HOSPITAL_BASED_OUTPATIENT_CLINIC_OR_DEPARTMENT_OTHER): Payer: Medicaid Other | Admitting: Oncology

## 2020-02-05 ENCOUNTER — Other Ambulatory Visit: Payer: Self-pay | Admitting: *Deleted

## 2020-02-05 VITALS — HR 99

## 2020-02-05 VITALS — BP 130/76 | HR 103 | Temp 98.3°F | Resp 19 | Ht 61.0 in | Wt 307.4 lb

## 2020-02-05 DIAGNOSIS — Z170 Estrogen receptor positive status [ER+]: Secondary | ICD-10-CM

## 2020-02-05 DIAGNOSIS — C50412 Malignant neoplasm of upper-outer quadrant of left female breast: Secondary | ICD-10-CM

## 2020-02-05 DIAGNOSIS — Z5111 Encounter for antineoplastic chemotherapy: Secondary | ICD-10-CM | POA: Diagnosis not present

## 2020-02-05 DIAGNOSIS — Z6841 Body Mass Index (BMI) 40.0 and over, adult: Secondary | ICD-10-CM

## 2020-02-05 DIAGNOSIS — I10 Essential (primary) hypertension: Secondary | ICD-10-CM

## 2020-02-05 DIAGNOSIS — Z95828 Presence of other vascular implants and grafts: Secondary | ICD-10-CM

## 2020-02-05 LAB — CBC WITH DIFFERENTIAL/PLATELET
Abs Immature Granulocytes: 0.02 10*3/uL (ref 0.00–0.07)
Basophils Absolute: 0.1 10*3/uL (ref 0.0–0.1)
Basophils Relative: 1 %
Eosinophils Absolute: 2.1 10*3/uL — ABNORMAL HIGH (ref 0.0–0.5)
Eosinophils Relative: 30 %
HCT: 22.6 % — ABNORMAL LOW (ref 36.0–46.0)
Hemoglobin: 7.5 g/dL — ABNORMAL LOW (ref 12.0–15.0)
Immature Granulocytes: 0 %
Lymphocytes Relative: 32 %
Lymphs Abs: 2.3 10*3/uL (ref 0.7–4.0)
MCH: 31.1 pg (ref 26.0–34.0)
MCHC: 33.2 g/dL (ref 30.0–36.0)
MCV: 93.8 fL (ref 80.0–100.0)
Monocytes Absolute: 0.6 10*3/uL (ref 0.1–1.0)
Monocytes Relative: 8 %
Neutro Abs: 2.1 10*3/uL (ref 1.7–7.7)
Neutrophils Relative %: 29 %
Platelets: 235 10*3/uL (ref 150–400)
RBC: 2.41 MIL/uL — ABNORMAL LOW (ref 3.87–5.11)
RDW: 13.5 % (ref 11.5–15.5)
WBC: 7 10*3/uL (ref 4.0–10.5)
nRBC: 0 % (ref 0.0–0.2)

## 2020-02-05 LAB — COMPREHENSIVE METABOLIC PANEL
ALT: 22 U/L (ref 0–44)
AST: 35 U/L (ref 15–41)
Albumin: 2.5 g/dL — ABNORMAL LOW (ref 3.5–5.0)
Alkaline Phosphatase: 59 U/L (ref 38–126)
Anion gap: 16 — ABNORMAL HIGH (ref 5–15)
BUN: 14 mg/dL (ref 8–23)
CO2: 19 mmol/L — ABNORMAL LOW (ref 22–32)
Calcium: 7.3 mg/dL — ABNORMAL LOW (ref 8.9–10.3)
Chloride: 102 mmol/L (ref 98–111)
Creatinine, Ser: 1.54 mg/dL — ABNORMAL HIGH (ref 0.44–1.00)
GFR calc Af Amer: 41 mL/min — ABNORMAL LOW (ref >60)
GFR calc non Af Amer: 35 mL/min — ABNORMAL LOW (ref >60)
Glucose, Bld: 97 mg/dL (ref 70–99)
Potassium: 3.2 mmol/L — ABNORMAL LOW (ref 3.5–5.1)
Sodium: 137 mmol/L (ref 135–145)
Total Bilirubin: 0.4 mg/dL (ref 0.3–1.2)
Total Protein: 7.3 g/dL (ref 6.5–8.1)

## 2020-02-05 LAB — ABO/RH: ABO/RH(D): A POS

## 2020-02-05 LAB — SAMPLE TO BLOOD BANK

## 2020-02-05 MED ORDER — HEPARIN SOD (PORK) LOCK FLUSH 100 UNIT/ML IV SOLN
500.0000 [IU] | Freq: Once | INTRAVENOUS | Status: AC | PRN
  Administered 2020-02-05: 500 [IU]
  Filled 2020-02-05: qty 5

## 2020-02-05 MED ORDER — SODIUM CHLORIDE 0.9% FLUSH
10.0000 mL | INTRAVENOUS | Status: DC | PRN
  Filled 2020-02-05: qty 10

## 2020-02-05 MED ORDER — DOXORUBICIN HCL CHEMO IV INJECTION 2 MG/ML
40.0000 mg/m2 | Freq: Once | INTRAVENOUS | Status: AC
  Administered 2020-02-05: 100 mg via INTRAVENOUS
  Filled 2020-02-05: qty 50

## 2020-02-05 MED ORDER — SODIUM CHLORIDE 0.9% FLUSH
10.0000 mL | INTRAVENOUS | Status: DC | PRN
  Administered 2020-02-05 (×2): 10 mL via INTRAVENOUS
  Filled 2020-02-05: qty 10

## 2020-02-05 MED ORDER — SODIUM CHLORIDE 0.9 % IV SOLN
150.0000 mg | Freq: Once | INTRAVENOUS | Status: AC
  Administered 2020-02-05: 150 mg via INTRAVENOUS
  Filled 2020-02-05: qty 150

## 2020-02-05 MED ORDER — PROCHLORPERAZINE MALEATE 10 MG PO TABS
10.0000 mg | ORAL_TABLET | Freq: Four times a day (QID) | ORAL | 0 refills | Status: DC | PRN
Start: 2020-02-05 — End: 2020-02-14

## 2020-02-05 MED ORDER — PALONOSETRON HCL INJECTION 0.25 MG/5ML
0.2500 mg | Freq: Once | INTRAVENOUS | Status: AC
  Administered 2020-02-05: 0.25 mg via INTRAVENOUS

## 2020-02-05 MED ORDER — ONDANSETRON HCL 4 MG PO TABS: 4.0000 mg | ORAL_TABLET | Freq: Three times a day (TID) | ORAL | 0 refills | Status: DC | PRN

## 2020-02-05 MED ORDER — LIDOCAINE-PRILOCAINE 2.5-2.5 % EX CREA
1.0000 | TOPICAL_CREAM | CUTANEOUS | 0 refills | Status: DC | PRN
Start: 2020-02-05 — End: 2021-08-31

## 2020-02-05 MED ORDER — SODIUM CHLORIDE 0.9 % IV SOLN
400.0000 mg/m2 | Freq: Once | INTRAVENOUS | Status: AC
  Administered 2020-02-05: 1000 mg via INTRAVENOUS
  Filled 2020-02-05: qty 50

## 2020-02-05 MED ORDER — DEXAMETHASONE 4 MG PO TABS: 4.0000 mg | ORAL_TABLET | Freq: Two times a day (BID) | ORAL | 1 refills | Status: DC

## 2020-02-05 MED ORDER — SODIUM CHLORIDE 0.9 % IV SOLN
Freq: Once | INTRAVENOUS | Status: AC
  Filled 2020-02-05: qty 250

## 2020-02-05 MED ORDER — SODIUM CHLORIDE 0.9 % IV SOLN
10.0000 mg | Freq: Once | INTRAVENOUS | Status: AC
  Administered 2020-02-05: 10 mg via INTRAVENOUS
  Filled 2020-02-05: qty 10

## 2020-02-05 MED ORDER — PALONOSETRON HCL INJECTION 0.25 MG/5ML
INTRAVENOUS | Status: AC
  Filled 2020-02-05: qty 5

## 2020-02-05 NOTE — Progress Notes (Signed)
Okay to treat with Hgb 7.5 and creat 1.54 per Dr. Jana Hakim. Patient will receive 1 unit blood this week.

## 2020-02-05 NOTE — Patient Instructions (Addendum)
[facility] Discharge Instructions for Patients Receiving Chemotherapy  Today you received the following chemotherapy agents: Adriamycin, Cytoxan  To help prevent nausea and vomiting after your treatment, we encourage you to take your nausea medication as directed.   If you develop nausea and vomiting that is not controlled by your nausea medication, call the clinic.   BELOW ARE SYMPTOMS THAT SHOULD BE REPORTED IMMEDIATELY:  *FEVER GREATER THAN 100.5 F  *CHILLS WITH OR WITHOUT FEVER  NAUSEA AND VOMITING THAT IS NOT CONTROLLED WITH YOUR NAUSEA MEDICATION  *UNUSUAL SHORTNESS OF BREATH  *UNUSUAL BRUISING OR BLEEDING  TENDERNESS IN MOUTH AND THROAT WITH OR WITHOUT PRESENCE OF ULCERS  *URINARY PROBLEMS  *BOWEL PROBLEMS  UNUSUAL RASH Items with * indicate a potential emergency and should be followed up as soon as possible.  Feel free to call the clinic should you have any questions or concerns. The clinic phone number is [phone].  Please show the CHEMO ALERT CARD at check-in to the Emergency Department and triage nurse.  Doxorubicin injection What is this medicine? DOXORUBICIN (dox oh ROO bi sin) is a chemotherapy drug. It is used to treat many kinds of cancer like leukemia, lymphoma, neuroblastoma, sarcoma, and Wilms' tumor. It is also used to treat bladder cancer, breast cancer, lung cancer, ovarian cancer, stomach cancer, and thyroid cancer. This medicine may be used for other purposes; ask your health care provider or pharmacist if you have questions. COMMON BRAND NAME(S): Adriamycin, Adriamycin PFS, Adriamycin RDF, Rubex What should I tell my health care provider before I take this medicine? They need to know if you have any of these conditions:  heart disease  history of low blood counts caused by a medicine  liver disease  recent or ongoing radiation therapy  an unusual or allergic reaction to doxorubicin, other chemotherapy agents, other  medicines, foods, dyes, or preservatives  pregnant or trying to get pregnant  breast-feeding How should I use this medicine? This drug is given as an infusion into a vein. It is administered in a hospital or clinic by a specially trained health care professional. If you have pain, swelling, burning or any unusual feeling around the site of your injection, tell your health care professional right away. Talk to your pediatrician regarding the use of this medicine in children. Special care may be needed. Overdosage: If you think you have taken too much of this medicine contact a poison control center or emergency room at once. NOTE: This medicine is only for you. Do not share this medicine with others. What if I miss a dose? It is important not to miss your dose. Call your doctor or health care professional if you are unable to keep an appointment. What may interact with this medicine? This medicine may interact with the following medications:  6-mercaptopurine  paclitaxel  phenytoin  St. John's Wort  trastuzumab  verapamil This list may not describe all possible interactions. Give your health care provider a list of all the medicines, herbs, non-prescription drugs, or dietary supplements you use. Also tell them if you smoke, drink alcohol, or use illegal drugs. Some items may interact with your medicine. What should I watch for while using this medicine? This drug may make you feel generally unwell. This is not uncommon, as chemotherapy can affect healthy cells as well as cancer cells. Report any side effects. Continue your course of treatment even though you feel ill unless your doctor tells you to stop. There is a maximum amount of   this medicine you should receive throughout your life. The amount depends on the medical condition being treated and your overall health. Your doctor will watch how much of this medicine you receive in your lifetime. Tell your doctor if you have taken this  medicine before. You may need blood work done while you are taking this medicine. Your urine may turn red for a few days after your dose. This is not blood. If your urine is dark or brown, call your doctor. In some cases, you may be given additional medicines to help with side effects. Follow all directions for their use. Call your doctor or health care professional for advice if you get a fever, chills or sore throat, or other symptoms of a cold or flu. Do not treat yourself. This drug decreases your body's ability to fight infections. Try to avoid being around people who are sick. This medicine may increase your risk to bruise or bleed. Call your doctor or health care professional if you notice any unusual bleeding. Talk to your doctor about your risk of cancer. You may be more at risk for certain types of cancers if you take this medicine. Do not become pregnant while taking this medicine or for 6 months after stopping it. Women should inform their doctor if they wish to become pregnant or think they might be pregnant. Men should not father a child while taking this medicine and for 6 months after stopping it. There is a potential for serious side effects to an unborn child. Talk to your health care professional or pharmacist for more information. Do not breast-feed an infant while taking this medicine. This medicine has caused ovarian failure in some women and reduced sperm counts in some men This medicine may interfere with the ability to have a child. Talk with your doctor or health care professional if you are concerned about your fertility. This medicine may cause a decrease in Co-Enzyme Q-10. You should make sure that you get enough Co-Enzyme Q-10 while you are taking this medicine. Discuss the foods you eat and the vitamins you take with your health care professional. What side effects may I notice from receiving this medicine? Side effects that you should report to your doctor or health care  professional as soon as possible:  allergic reactions like skin rash, itching or hives, swelling of the face, lips, or tongue  breathing problems  chest pain  fast or irregular heartbeat  low blood counts - this medicine may decrease the number of white blood cells, red blood cells and platelets. You may be at increased risk for infections and bleeding.  pain, redness, or irritation at site where injected  signs of infection - fever or chills, cough, sore throat, pain or difficulty passing urine  signs of decreased platelets or bleeding - bruising, pinpoint red spots on the skin, black, tarry stools, blood in the urine  swelling of the ankles, feet, hands  tiredness  weakness Side effects that usually do not require medical attention (report to your doctor or health care professional if they continue or are bothersome):  diarrhea  hair loss  mouth sores  nail discoloration or damage  nausea  red colored urine  vomiting This list may not describe all possible side effects. Call your doctor for medical advice about side effects. You may report side effects to FDA at 1-800-FDA-1088. Where should I keep my medicine? This drug is given in a hospital or clinic and will not be stored at home. NOTE:  This sheet is a summary. It may not cover all possible information. If you have questions about this medicine, talk to your doctor, pharmacist, or health care provider.  2020 [employer]vier/Gold Standard (2017-03-30 11:01:26)  Cyclophosphamide Injection What is this medicine? CYCLOPHOSPHAMIDE (sye kloe FOSS fa mide) is a chemotherapy drug. It slows the growth of cancer cells. This medicine is used to treat many types of cancer like lymphoma, myeloma, leukemia, breast cancer, and ovarian cancer, to name a few. This medicine may be used for other purposes; ask your health care provider or pharmacist if you have questions. COMMON BRAND NAME(S): Cytoxan, Neosar What should I tell my health  care provider before I take this medicine? They need to know if you have any of these conditions:  heart disease  history of irregular heartbeat  infection  kidney disease  liver disease  low blood counts, like white cells, platelets, or red blood cells  on hemodialysis  recent or ongoing radiation therapy  scarring or thickening of the lungs  trouble passing urine  an unusual or allergic reaction to cyclophosphamide, other medicines, foods, dyes, or preservatives  pregnant or trying to get pregnant  breast-feeding How should I use this medicine? This drug is usually given as an injection into a vein or muscle or by infusion into a vein. It is administered in a hospital or clinic by a specially trained health care professional. Talk to your pediatrician regarding the use of this medicine in children. Special care may be needed. Overdosage: If you think you have taken too much of this medicine contact a poison control center or emergency room at once. NOTE: This medicine is only for you. Do not share this medicine with others. What if I miss a dose? It is important not to miss your dose. Call your doctor or health care professional if you are unable to keep an appointment. What may interact with this medicine?  amphotericin B  azathioprine  certain antivirals for HIV or hepatitis  certain medicines for blood pressure, heart disease, irregular heart beat  certain medicines that treat or prevent blood clots like warfarin  certain other medicines for cancer  cyclosporine  etanercept  indomethacin  medicines that relax muscles for surgery  medicines to increase blood counts  metronidazole This list may not describe all possible interactions. Give your health care provider a list of all the medicines, herbs, non-prescription drugs, or dietary supplements you use. Also tell them if you smoke, drink alcohol, or use illegal drugs. Some items may interact with your  medicine. What should I watch for while using this medicine? Your condition will be monitored carefully while you are receiving this medicine. You may need blood work done while you are taking this medicine. Drink water or other fluids as directed. Urinate often, even at night. Some products may contain alcohol. Ask your health care professional if this medicine contains alcohol. Be sure to tell all health care professionals you are taking this medicine. Certain medicines, like metronidazole and disulfiram, can cause an unpleasant reaction when taken with alcohol. The reaction includes flushing, headache, nausea, vomiting, sweating, and increased thirst. The reaction can last from 30 minutes to several hours. Do not become pregnant while taking this medicine or for 1 year after stopping it. Women should inform their health care professional if they wish to become pregnant or think they might be pregnant. Men should not father a child while taking this medicine and for 4 months after stopping it. There is potential   for serious side effects to an unborn child. Talk to your health care professional for more information. Do not breast-feed an infant while taking this medicine or for 1 week after stopping it. This medicine has caused ovarian failure in some women. This medicine may make it more difficult to get pregnant. Talk to your health care professional if you are concerned about your fertility. This medicine has caused decreased sperm counts in some men. This may make it more difficult to father a child. Talk to your health care professional if you are concerned about your fertility. Call your health care professional for advice if you get a fever, chills, or sore throat, or other symptoms of a cold or flu. Do not treat yourself. This medicine decreases your body's ability to fight infections. Try to avoid being around people who are sick. Avoid taking medicines that contain aspirin, acetaminophen,  ibuprofen, naproxen, or ketoprofen unless instructed by your health care professional. These medicines may hide a fever. Talk to your health care professional about your risk of cancer. You may be more at risk for certain types of cancer if you take this medicine. If you are going to need surgery or other procedure, tell your health care professional that you are using this medicine. Be careful brushing or flossing your teeth or using a toothpick because you may get an infection or bleed more easily. If you have any dental work done, tell your dentist you are receiving this medicine. What side effects may I notice from receiving this medicine? Side effects that you should report to your doctor or health care professional as soon as possible:  allergic reactions like skin rash, itching or hives, swelling of the face, lips, or tongue  breathing problems  nausea, vomiting  signs and symptoms of bleeding such as bloody or black, tarry stools; red or dark brown urine; spitting up blood or brown material that looks like coffee grounds; red spots on the skin; unusual bruising or bleeding from the eyes, gums, or nose  signs and symptoms of heart failure like fast, irregular heartbeat, sudden weight gain; swelling of the ankles, feet, hands  signs and symptoms of infection like fever; chills; cough; sore throat; pain or trouble passing urine  signs and symptoms of kidney injury like trouble passing urine or change in the amount of urine  signs and symptoms of liver injury like dark yellow or brown urine; general ill feeling or flu-like symptoms; light-colored stools; loss of appetite; nausea; right upper belly pain; unusually weak or tired; yellowing of the eyes or skin Side effects that usually do not require medical attention (report to your doctor or health care professional if they continue or are bothersome):  confusion  decreased hearing  diarrhea  facial flushing  hair  loss  headache  loss of appetite  missed menstrual periods  signs and symptoms of low red blood cells or anemia such as unusually weak or tired; feeling faint or lightheaded; falls  skin discoloration This list may not describe all possible side effects. Call your doctor for medical advice about side effects. You may report side effects to FDA at 1-800-FDA-1088. Where should I keep my medicine? This drug is given in a hospital or clinic and will not be stored at home. NOTE: This sheet is a summary. It may not cover all possible information. If you have questions about this medicine, talk to your doctor, pharmacist, or health care provider.  2020 [employer]/Gold Standard (2019-05-21 09:53:29)

## 2020-02-05 NOTE — Patient Instructions (Signed)

## 2020-02-06 ENCOUNTER — Telehealth: Payer: Self-pay | Admitting: Oncology

## 2020-02-06 NOTE — Progress Notes (Signed)
[name]  [phone] [phone]     ID: Hugh Garrow DOB: Jan 19, 1956  MR#: [id number]  FHL#:[id number]  Patient Care Team: [name], MD as PCP - General (Family Medicine) Mauro Kaufmann, RN as Oncology Nurse Navigator Rockwell Germany, RN as Oncology Nurse Navigator [name], MD as Consulting Physician (General Surgery) , Virgie Dad, MD as Consulting Physician (Oncology) Kyung Rudd, MD as Consulting Physician (Radiation Oncology) Edrick Kins, DPM as Consulting Physician (Podiatry) Chauncey Cruel, MD OTHER MD:  CHIEF COMPLAINT: estrogen receptor positive breast cancer  CURRENT TREATMENT: Adjuvant chemotherapy    INTERVAL HISTORY: Jamonica returns today for follow up and treatment of her estrogen receptor positive breast cancer.  She started her adjuvant chemotherapy, consisting of cyclophosphamide and doxorubicin in dose dense fashion x4, on 02/05/2020.  I had dose reduced the first cycle just to make sure she will be able to tolerate it well.  Today is day 3 cycle 1 and she will be receiving her growth factors today.  She will also be transfused today and tomorrow for her hemoglobin of 7.5.  Her most recent echocardiogram on 11/26/2019 showed an ejection fraction of 65-70%.   REVIEW OF SYSTEMS: [name] has had no nausea or vomiting so far.  She of course is getting her Neulasta today.  She is aware of the possibility of flulike symptoms and bony aches.  She is doing her normal activities around the house.  She tells me her daughter will be coming today and she is very much looking forward to that.  So far she has been tolerating treatment remarkably well.   HISTORY OF CURRENT ILLNESS: From the original intake note:  [name] (pronounced "yellow") had routine screening mammography on 10/24/2019 showing a possible abnormality in the left breast. She underwent left breast ultrasonography at [facility] on  11/05/2019 showing: suspicious 3.8 cm mass in left breast at 3 o'clock; five abnormal lymph nodes.  Accordingly on 11/12/2019 she proceeded to biopsy of the left breast area in question as well as a lymph node. The pathology from this procedure ([id number]) showed: invasive ductal carcinoma with prominent inflammatory response, grade 3. Prognostic indicators significant for: estrogen receptor, 100% positive and progesterone receptor, 50% positive, both with strong staining intensity. Proliferation marker Ki67 at 70%. HER2 negative by immunohistochemistry (0).  The biopsied lymph node confirmed metastatic breast carcinoma.  The patient's subsequent history is as detailed below.   PAST MEDICAL HISTORY: Past Medical History:  Diagnosis Date  . Arthritis   . Cancer ([facility]) 11/2019   Left breast  . Diverticulitis   . Family history of ovarian cancer   . Heart murmur    11/26/19 echo: Mild AS. AV mean gradient 12.0 mmHg; however, LVOT gradient 8 mmHg with intracvitary gradient-significant AS is not suspected  . Hypertension     PAST SURGICAL HISTORY: Past Surgical History:  Procedure Laterality Date  . MASTECTOMY WITH AXILLARY LYMPH NODE DISSECTION Left 12/19/2019   Procedure: LEFT MASTECTOMY WITH AXILLARY LYMPH NODE DISSECTION;  Surgeon: [name], MD;  Location: [facility];  Service: General;  Laterality: Left;  Marland Kitchen MULTIPLE EXTRACTIONS WITH ALVEOLOPLASTY Bilateral 12/14/2019   Procedure: MULTIPLE EXTRACTION WITH ALVEOLOPLASTY;  Surgeon: [name], DDS;  Location: [facility];  Service: Oral Surgery;  Laterality: Bilateral;  . PORTACATH PLACEMENT N/A 12/19/2019   Procedure: INSERTION PORT-A-CATH WITH ULTRASOUND GUIDANCE;  Surgeon: [name], MD;  Location: [facility];  Service: General;  Laterality: N/A;  . RADIAL HEAD ARTHROPLASTY  Right 06/25/2019   Procedure: RIGHT RADIAL HEAD ARTHROPLASTY;  Surgeon: [name], MD;  Location: [facility];  Service: Orthopedics;  Laterality: Right;  . TUBAL  LIGATION      FAMILY HISTORY: Family History  Problem Relation Age of Onset  . Hypertension Mother   . Dementia Mother   . Ovarian cancer Half-Sister 3  . Diabetes Half-Sister   . Stroke Half-Sister   Her father died at age 87; she is unsure of the cause of death. Her mother is age 65 as of 10/2019.  The patient has two brothers and four sisters. She reports ovarian cancer in her sister at age 72. She denies a family history of breast, prostate, or pancreatic cancer.   GYNECOLOGIC HISTORY:  No LMP recorded. Patient is postmenopausal. Menarche: 64 years old Age at first live birth: 64 years old [place] 2 LMP around age 31 Contraceptive: used previously HRT never used  Hysterectomy? no BSO? no   SOCIAL HISTORY: (updated 10/2019)  [name] retired from working at the [employer]. She is divorced. She lives at home alone but during the pandemic her sister Manus Gunning and mother Raford Pitcher lived in the same home. Daughter [name], age 34, lives in [place] and is handicapped. [name], age 4, is a Biomedical scientist here in [place]. Viveca has two granddaughters. She attends a local [employer].    ADVANCED DIRECTIVES: not in place; at the 11/21/2019 visit the patient was given the appropriate documents to complete and notarized at her discretion.  She expressed spontaneously that she would not want to be resuscitated in case of a terminal event.    HEALTH MAINTENANCE: Social History   Tobacco Use  . Smoking status: Never Smoker  . Smokeless tobacco: Never Used  Vaping Use  . Vaping Use: Never used  Substance Use Topics  . Alcohol use: Yes    Alcohol/week: 3.0 standard drinks    Types: 1 Cans of beer, 2 Shots of liquor per week    Comment: daily  . Drug use: Yes    Types: Marijuana     Colonoscopy: never done  PAP: "1980?"  Bone density: never done   Allergies  Allergen Reactions  . Amlodipine Swelling    BLE edema    Current Outpatient Medications  Medication Sig Dispense Refill   . dexamethasone (DECADRON) 4 MG tablet Take 1 tablet (4 mg total) by mouth 2 (two) times daily with a meal. 30 tablet 1  . lidocaine-prilocaine (EMLA) cream Apply 1 application topically as needed. 30 g 0  . ondansetron (ZOFRAN) 4 MG tablet Take 1 tablet (4 mg total) by mouth every 8 (eight) hours as needed for nausea or vomiting. 20 tablet 0  . prochlorperazine (COMPAZINE) 10 MG tablet Take 1 tablet (10 mg total) by mouth every 6 (six) hours as needed for nausea or vomiting. 30 tablet 0   No current facility-administered medications for this visit.   Facility-Administered Medications Ordered in Other Visits  Medication Dose Route Frequency Provider Last Rate Last Admin  . heparin lock flush 100 unit/mL  250 Units Intracatheter PRN Citlali Gautney, Virgie Dad, MD      . sodium chloride flush (NS) 0.9 % injection 10 mL  10 mL Intracatheter PRN Lindey Renzulli, Virgie Dad, MD      . sodium chloride flush (NS) 0.9 % injection 3 mL  3 mL Intracatheter PRN Catie Chiao, Virgie Dad, MD        OBJECTIVE: African-American woman examined in the infusion area  There were no vitals filed for this visit.   There is no height or weight on file to calculate BMI.   Wt Readings from Last 3 Encounters:  02/05/20 (!) 307 lb 6.4 oz (139.4 kg)  01/27/20 (!) 314 lb 9.5 oz (142.7 kg)  01/17/20 (!) 303 lb 14.4 oz (137.8 kg)  Vitals for the 02/07/2020 visit are available through the infusion area flowsheet    ECOG FS:2 - Symptomatic, <50% confined to bed  Sclerae unicteric, EOMs intact Wearing a mask Lungs no rales or rhonchi, auscultated anterolaterally Heart regular rate and rhythm Abd soft, obese, nontender Neuro: nonfocal, well oriented, appropriate affect Breasts: Deferred   LAB RESULTS:  CMP     Component Value Date/Time   NA 137 02/05/2020 0901   NA 135 01/02/2020 1710   K 3.2 (L) 02/05/2020 0901   CL 102 02/05/2020 0901   CO2 19 (L) 02/05/2020 0901   GLUCOSE 97 02/05/2020 0901   BUN 14 02/05/2020 0901   BUN  25 01/02/2020 1710   CREATININE 1.54 (H) 02/05/2020 0901   CREATININE 1.59 (H) 11/21/2019 0908   CALCIUM 7.3 (L) 02/05/2020 0901   PROT 7.3 02/05/2020 0901   PROT 8.5 06/15/2019 1622   ALBUMIN 2.5 (L) 02/05/2020 0901   ALBUMIN 4.3 06/15/2019 1622   AST 35 02/05/2020 0901   AST 61 (H) 11/21/2019 0908   ALT 22 02/05/2020 0901   ALT 32 11/21/2019 0908   ALKPHOS 59 02/05/2020 0901   BILITOT 0.4 02/05/2020 0901   BILITOT 0.5 11/21/2019 0908   GFRNONAA 35 (L) 02/05/2020 0901   GFRNONAA 34 (L) 11/21/2019 0908   GFRAA 41 (L) 02/05/2020 0901   GFRAA 39 (L) 11/21/2019 0908    No results found for: TOTALPROTELP, ALBUMINELP, A1GS, A2GS, BETS, BETA2SER, GAMS, MSPIKE, SPEI  Lab Results  Component Value Date   WBC 7.0 02/05/2020   NEUTROABS 2.1 02/05/2020   HGB 7.5 (L) 02/05/2020   HCT 22.6 (L) 02/05/2020   MCV 93.8 02/05/2020   PLT 235 02/05/2020    No results found for: LABCA2  No components found for: TZGYFV494  No results for input(s): INR in the last 168 hours.  No results found for: LABCA2  No results found for: WHQ759  No results found for: FMB846  No results found for: KZL935  No results found for: CA2729  No components found for: HGQUANT  No results found for: CEA1 / No results found for: CEA1   No results found for: AFPTUMOR  No results found for: CHROMOGRNA  No results found for: KPAFRELGTCHN, LAMBDASER, KAPLAMBRATIO (kappa/lambda light chains)  No results found for: HGBA, HGBA2QUANT, HGBFQUANT, HGBSQUAN (Hemoglobinopathy evaluation)   No results found for: LDH  Lab Results  Component Value Date   IRON 111 01/22/2020   TIBC 231 (L) 01/22/2020   IRONPCTSAT 48 (H) 01/22/2020   (Iron and TIBC)  Lab Results  Component Value Date   FERRITIN 3000 (H) 11/10/2007    Urinalysis    Component Value Date/Time   COLORURINE YELLOW 01/22/2020 1601   APPEARANCEUR HAZY (A) 01/22/2020 1601   LABSPEC 1.009 01/22/2020 1601   PHURINE 5.0 01/22/2020 1601    GLUCOSEU NEGATIVE 01/22/2020 1601   HGBUR NEGATIVE 01/22/2020 1601   BILIRUBINUR NEGATIVE 01/22/2020 1601   KETONESUR NEGATIVE 01/22/2020 1601   PROTEINUR NEGATIVE 01/22/2020 1601   UROBILINOGEN 1.0 07/14/2012 1630   NITRITE NEGATIVE 01/22/2020 1601   LEUKOCYTESUR SMALL (A) 01/22/2020 1601     STUDIES: US RENAL  Result Date:  01/22/2020 CLINICAL DATA:  Acute renal failure. EXAM: RENAL / URINARY TRACT ULTRASOUND COMPLETE COMPARISON:  None. FINDINGS: Right Kidney: Renal measurements: 12.6 x 5.4 x 6.4 cm = volume: 224 mL . Echogenicity within normal limits. No mass or hydronephrosis visualized. Left Kidney: Renal measurements: 11.2 x 6.0 x 5.7 cm = volume: 202 mL. Echogenicity within normal limits. No mass or hydronephrosis visualized. Bladder: Appears normal for degree of bladder distention. Other: None. IMPRESSION: Normal renal ultrasound. Electronically Signed   By: Lajean Manes M.D.   On: 01/22/2020 14:46   ECHOCARDIOGRAM LIMITED  Result Date: 01/22/2020    ECHOCARDIOGRAM LIMITED REPORT   Patient Name:   AKEYLAH HENDEL Date of Exam: 01/22/2020 Medical Rec #:  [id number]     Height:       61.0 in Accession #:    [id number]    Weight:       303.9 lb Date of Birth:  Mar 01, 1956    BSA:          2.256 m Patient Age:    64 years      BP:           92/60 mmHg Patient Gender: F             HR:           73 bpm. Exam Location:  Inpatient Procedure: Limited Echo Indications:    Pericardial effusion  History:        Patient has prior history of Echocardiogram examinations, most                 recent 11/26/2019. Risk Factors:Hypertension. Breast cancer, s/p                 mastectomy.  Sonographer:    Dustin Flock Referring Phys: [place]  1. Left ventricular ejection fraction, by estimation, is 65 to 70%. The left ventricle has normal function. The left ventricle has no regional wall motion abnormalities. There is mild left ventricular hypertrophy.  2. Right ventricular systolic  function is normal. The right ventricular size is normal.  3. The inferior vena cava is normal in size with greater than 50% respiratory variability, suggesting right atrial pressure of 3 mmHg.  4. No pericardial effusion.  5. Limited echo with no doppler. FINDINGS  Left Ventricle: Left ventricular ejection fraction, by estimation, is 65 to 70%. The left ventricle has normal function. The left ventricle has no regional wall motion abnormalities. The left ventricular internal cavity size was normal in size. There is  mild left ventricular hypertrophy. Right Ventricle: The right ventricular size is normal. No increase in right ventricular wall thickness. Right ventricular systolic function is normal. Left Atrium: Left atrial size was normal in size. Right Atrium: Right atrial size was normal in size. Pericardium: There is no evidence of pericardial effusion. Venous: The inferior vena cava is normal in size with greater than 50% respiratory variability, suggesting right atrial pressure of 3 mmHg. IAS/Shunts: No atrial level shunt detected by color flow Doppler.  LEFT VENTRICLE PLAX 2D LVIDd:         3.30 cm LVIDs:         1.80 cm LV PW:         1.50 cm LV IVS:        1.30 cm  Loralie Champagne MD Electronically signed by Loralie Champagne MD Signature Date/Time: 01/22/2020/3:19:05 PM    Final      ELIGIBLE FOR AVAILABLE RESEARCH PROTOCOL: AET  ASSESSMENT:  64 y.o. [place] woman status post left breast upper outer quadrant and left axillary lymph node biopsies 11/12/2019 for a clinical T2 N2, stage IIIA invasive ductal carcinoma, grade 3, estrogen and progesterone receptor positive, HER-2 not amplified, with an MIB-1 of 70%  (a) the biopsied axillary lymph node was positive  (1) genetics testing 11/29/2019 through the Breast Cancer STAT Panel offered by [facility] found no deleterious mutations in ATM, BRCA1, BRCA2, CDH1, CHEK2, PALB2, PTEN, STK11 and TP53.  Additional testing through the Common Hereditary Cancers Panel  confirmed no deleterious mutations in APC, ATM, AXIN2, BARD1, BMPR1A, BRCA1, BRCA2, BRIP1, CDH1, CDK4, CDKN2A (p14ARF), CDKN2A (p16INK4a), CHEK2, CTNNA1, DICER1, EPCAM (Deletion/duplication testing only), GREM1 (promoter region deletion/duplication testing only), KIT, MEN1, MLH1, MSH2, MSH3, MSH6, MUTYH, NBN, NF1, NHTL1, PALB2, PDGFRA, PMS2, POLD1, POLE, PTEN, RAD50, RAD51C, RAD51D, RNF43, SDHB, SDHC, SDHD, SMAD4, SMARCA4. STK11, TP53, TSC1, TSC2, and VHL.  The following genes were evaluated for sequence changes only: SDHA and HOXB13 c.251G>A variant only.  (2) status post left modified radical mastectomy 12/19/2019 for a pT2 pN1, stage IIB invasive ductal carcinoma, grade 3, with negative margins.  (a) 1 of 13 axillary lymph nodes removed was positive  (3) adjuvant chemotherapy will consist of cyclophosphamide and doxorubicin in dose dense fashion x4 starting 01/22/2020 followed by weekly paclitaxel x12  (a) baseline echocardiogram 11/26/2019 shows an ejection fraction in the 65-70% range.  (b) first cycle of cyclophosphamide and doxorubicin dose reduced to assess tolerance  (4) adjuvant radiation as appropriate  (5) antiestrogens to follow   PLAN: Tomasita so far is tolerating her first cycle of chemotherapy remarkably well.  She is receiving blood as well as some fluids today and tomorrow.  She is coming back on 02/12/2020 to check nadir counts and further assess tolerance but so far she is doing quite well.  Accordingly for cycle #2 I am going to up the dose to 50/500/m on the A/C.  She is already scheduled to see [place] again that day and we will add a nadir visit on day 7 or 8.  I hope to be able to give her full dose with the last 2 cycles of A/C.  Total encounter time 20 minutes.Sarajane Jews C. Antoinett Dorman, MD 02/07/2020 10:44 AM Medical Oncology and Hematology [facility] [place], [place] [address] Tel. [phone]    Fax. [phone]   This document serves  as a record of services personally performed by Lurline Del, MD. It was created on his behalf by Wilburn Mylar, a trained medical scribe. The creation of this record is based on the scribe's personal observations and the provider's statements to them.   I, Lurline Del MD, have reviewed the above documentation for accuracy and completeness, and I agree with the above.   *Total Encounter Time as defined by the Centers for Medicare and Medicaid Services includes, in addition to the face-to-face time of a patient visit (documented in the note above) non-face-to-face time: obtaining and reviewing outside history, ordering and reviewing medications, tests or procedures, care coordination (communications with other health care professionals or caregivers) and documentation in the medical record.

## 2020-02-06 NOTE — Telephone Encounter (Signed)
Scheduled appts per 6/8 los. Pt confirmed appt dates and times.  

## 2020-02-07 ENCOUNTER — Other Ambulatory Visit: Payer: Self-pay | Admitting: Oncology

## 2020-02-07 ENCOUNTER — Inpatient Hospital Stay (HOSPITAL_BASED_OUTPATIENT_CLINIC_OR_DEPARTMENT_OTHER): Payer: Medicaid Other | Admitting: Oncology

## 2020-02-07 ENCOUNTER — Other Ambulatory Visit: Payer: Self-pay

## 2020-02-07 ENCOUNTER — Inpatient Hospital Stay: Payer: Medicaid Other

## 2020-02-07 VITALS — BP 146/93 | HR 82 | Temp 97.6°F | Resp 20

## 2020-02-07 DIAGNOSIS — N179 Acute kidney failure, unspecified: Secondary | ICD-10-CM

## 2020-02-07 DIAGNOSIS — Z170 Estrogen receptor positive status [ER+]: Secondary | ICD-10-CM

## 2020-02-07 DIAGNOSIS — C50412 Malignant neoplasm of upper-outer quadrant of left female breast: Secondary | ICD-10-CM

## 2020-02-07 DIAGNOSIS — Z5111 Encounter for antineoplastic chemotherapy: Secondary | ICD-10-CM | POA: Diagnosis not present

## 2020-02-07 MED ORDER — DIPHENHYDRAMINE HCL 25 MG PO CAPS
ORAL_CAPSULE | ORAL | Status: AC
  Filled 2020-02-07: qty 1

## 2020-02-07 MED ORDER — SODIUM CHLORIDE 0.9% FLUSH
3.0000 mL | INTRAVENOUS | Status: DC | PRN
  Filled 2020-02-07: qty 10

## 2020-02-07 MED ORDER — DIPHENHYDRAMINE HCL 25 MG PO CAPS
25.0000 mg | ORAL_CAPSULE | Freq: Once | ORAL | Status: AC
  Administered 2020-02-07: 25 mg via ORAL

## 2020-02-07 MED ORDER — ACETAMINOPHEN 325 MG PO TABS
ORAL_TABLET | ORAL | Status: AC
  Filled 2020-02-07: qty 2

## 2020-02-07 MED ORDER — ACETAMINOPHEN 325 MG PO TABS
650.0000 mg | ORAL_TABLET | Freq: Once | ORAL | Status: AC
  Administered 2020-02-07: 650 mg via ORAL

## 2020-02-07 MED ORDER — SODIUM CHLORIDE 0.9% FLUSH
10.0000 mL | INTRAVENOUS | Status: AC | PRN
  Administered 2020-02-07: 10 mL
  Filled 2020-02-07: qty 10

## 2020-02-07 MED ORDER — SODIUM CHLORIDE 0.9% IV SOLUTION
250.0000 mL | Freq: Once | INTRAVENOUS | Status: AC
  Administered 2020-02-07: 250 mL via INTRAVENOUS
  Filled 2020-02-07: qty 250

## 2020-02-07 MED ORDER — SODIUM CHLORIDE 0.9 % IV SOLN
Freq: Once | INTRAVENOUS | Status: AC
  Filled 2020-02-07: qty 250

## 2020-02-07 MED ORDER — PEGFILGRASTIM-JMDB 6 MG/0.6ML ~~LOC~~ SOSY
6.0000 mg | PREFILLED_SYRINGE | Freq: Once | SUBCUTANEOUS | Status: AC
  Administered 2020-02-07: 6 mg via SUBCUTANEOUS

## 2020-02-07 MED ORDER — HEPARIN SOD (PORK) LOCK FLUSH 100 UNIT/ML IV SOLN
250.0000 [IU] | INTRAVENOUS | Status: AC | PRN
  Administered 2020-02-07: 250 [IU]
  Filled 2020-02-07: qty 5

## 2020-02-07 MED ORDER — PEGFILGRASTIM-JMDB 6 MG/0.6ML ~~LOC~~ SOSY
PREFILLED_SYRINGE | SUBCUTANEOUS | Status: AC
  Filled 2020-02-07: qty 0.6

## 2020-02-07 NOTE — Patient Instructions (Addendum)
Pegfilgrastim injection What is this medicine? PEGFILGRASTIM (PEG fil gra stim) is a long-acting granulocyte colony-stimulating factor that stimulates the growth of neutrophils, a type of white blood cell important in the body's fight against infection. It is used to reduce the incidence of fever and infection in patients with certain types of cancer who are receiving chemotherapy that affects the bone marrow, and to increase survival after being exposed to high doses of radiation. This medicine may be used for other purposes; ask your health care provider or pharmacist if you have questions. COMMON BRAND NAME(S): Fulphila, Neulasta, UDENYCA, Ziextenzo What should I tell my health care provider before I take this medicine? They need to know if you have any of these conditions:  kidney disease  latex allergy  ongoing radiation therapy  sickle cell disease  skin reactions to acrylic adhesives (On-Body Injector only)  an unusual or allergic reaction to pegfilgrastim, filgrastim, other medicines, foods, dyes, or preservatives  pregnant or trying to get pregnant  breast-feeding How should I use this medicine? This medicine is for injection under the skin. If you get this medicine at home, you will be taught how to prepare and give the pre-filled syringe or how to use the On-body Injector. Refer to the patient Instructions for Use for detailed instructions. Use exactly as directed. Tell your healthcare provider immediately if you suspect that the On-body Injector may not have performed as intended or if you suspect the use of the On-body Injector resulted in a missed or partial dose. It is important that you put your used needles and syringes in a special sharps container. Do not put them in a trash can. If you do not have a sharps container, call your pharmacist or healthcare provider to get one. Talk to your pediatrician regarding the use of this medicine in children. While this drug may be  prescribed for selected conditions, precautions do apply. Overdosage: If you think you have taken too much of this medicine contact a poison control center or emergency room at once. NOTE: This medicine is only for you. Do not share this medicine with others. What if I miss a dose? It is important not to miss your dose. Call your doctor or health care professional if you miss your dose. If you miss a dose due to an On-body Injector failure or leakage, a new dose should be administered as soon as possible using a single prefilled syringe for manual use. What may interact with this medicine? Interactions have not been studied. Give your health care provider a list of all the medicines, herbs, non-prescription drugs, or dietary supplements you use. Also tell them if you smoke, drink alcohol, or use illegal drugs. Some items may interact with your medicine. This list may not describe all possible interactions. Give your health care provider a list of all the medicines, herbs, non-prescription drugs, or dietary supplements you use. Also tell them if you smoke, drink alcohol, or use illegal drugs. Some items may interact with your medicine. What should I watch for while using this medicine? You may need blood work done while you are taking this medicine. If you are going to need a MRI, CT scan, or other procedure, tell your doctor that you are using this medicine (On-Body Injector only). What side effects may I notice from receiving this medicine? Side effects that you should report to your doctor or health care professional as soon as possible:  allergic reactions like skin rash, itching or hives, swelling of the   face, lips, or tongue  back pain  dizziness  fever  pain, redness, or irritation at site where injected  pinpoint red spots on the skin  red or dark-brown urine  shortness of breath or breathing problems  stomach or side pain, or pain at the  shoulder  swelling  tiredness  trouble passing urine or change in the amount of urine Side effects that usually do not require medical attention (report to your doctor or health care professional if they continue or are bothersome):  bone pain  muscle pain This list may not describe all possible side effects. Call your doctor for medical advice about side effects. You may report side effects to FDA at 1-800-FDA-1088. Where should I keep my medicine? Keep out of the reach of children. If you are using this medicine at home, you will be instructed on how to store it. Throw away any unused medicine after the expiration date on the label. NOTE: This sheet is a summary. It may not cover all possible information. If you have questions about this medicine, talk to your doctor, pharmacist, or health care provider.  2020 Elsevier/Gold Standard (2017-11-21 16:57:08)   Blood Transfusion, Adult A blood transfusion is a procedure in which you receive blood through an IV tube. You may need this procedure because of:  A bleeding disorder.  An illness.  An injury.  A surgery. The blood may come from someone else (a donor). You may also be able to donate blood for yourself. The blood given in a transfusion is made up of different types of cells. You may get:  Red blood cells. These carry oxygen to the cells in the body.  White blood cells. These help you fight infections.  Platelets. These help your blood to clot.  Plasma. This is the liquid part of your blood. It carries proteins and other substances through the body. If you have a clotting disorder, you may also get other types of blood products. Tell your doctor about:  Any blood disorders you have.  Any reactions you have had during a blood transfusion in the past.  Any allergies you have.  All medicines you are taking, including vitamins, herbs, eye drops, creams, and over-the-counter medicines.  Any surgeries you have  had.  Any medical conditions you have. This includes any recent fever or cold symptoms.  Whether you are pregnant or may be pregnant. What are the risks? Generally, this is a safe procedure. However, problems may occur.  The most common problems include: ? A mild allergic reaction. This includes red, swollen areas of skin (hives) and itching. ? Fever or chills. This may be the body's response to new blood cells received. This may happen during or up to 4 hours after the transfusion.  More serious problems may include: ? Too much fluid in the lungs. This may cause breathing problems. ? A serious allergic reaction. This includes breathing trouble or swelling around the face and lips. ? Lung injury. This causes breathing trouble and low oxygen in the blood. This can happen within hours of the transfusion or days later. ? Too much iron. This can happen after getting many blood transfusions over a period of time. ? An infection or virus passed through the blood. This is rare. Donated blood is carefully tested before it is given. ? Your body's defense system (immune system) trying to attack the new blood cells. This is rare. Symptoms may include fever, chills, nausea, low blood pressure, and low back or chest  pain. ? Donated cells attacking healthy tissues. This is rare. What happens before the procedure? Medicines Ask your doctor about:  Changing or stopping your normal medicines. This is important.  Taking aspirin and ibuprofen. Do not take these medicines unless your doctor tells you to take them.  Taking over-the-counter medicines, vitamins, herbs, and supplements. General instructions  Follow instructions from your doctor about what you cannot eat or drink.  You will have a blood test to find out your blood type. The test also finds out what type of blood your body will accept and matches it to the donor type.  If you are going to have a planned surgery, you may be able to donate  your own blood. This may be done in case you need a transfusion.  You will have your temperature, blood pressure, and pulse checked.  You may receive medicine to help prevent an allergic reaction. This may be done if you have had a reaction to a transfusion before. This medicine may be given to you by mouth or through an IV tube.  This procedure lasts about 1-4 hours. Plan for the time you need. What happens during the procedure?   An IV tube will be put into one of your veins.  The bag of donated blood will be attached to your IV tube. Then, the blood will enter through your vein.  Your temperature, blood pressure, and pulse will be checked often. This is done to find early signs of a transfusion reaction.  Tell your nurse right away if you have any of these symptoms: ? Shortness of breath or trouble breathing. ? Chest or back pain. ? Fever or chills. ? Red, swollen areas of skin or itching.  If you have any signs or symptoms of a reaction, your transfusion will be stopped. You may also be given medicine.  When the transfusion is finished, your IV tube will be taken out.  Pressure may be put on the IV site for a few minutes.  A bandage (dressing) will be put on the IV site. The procedure may vary among doctors and hospitals. What happens after the procedure?  You will be monitored until you leave the hospital or clinic. This includes checking your temperature, blood pressure, pulse, breathing rate, and blood oxygen level.  Your blood may be tested to see how you are responding to the transfusion.  You may be warmed with fluids or blankets. This is done to keep the temperature of your body normal.  If you have your procedure in an outpatient setting, you will be told whom to contact to report any reactions. Where to find more information To learn more, visit the [employer]: redcross.org Summary  A blood transfusion is a procedure in which you are given blood through  an IV tube.  The blood may come from someone else (a donor). You may also be able to donate blood for yourself.  The blood you are given is made up of different blood cells. You may receive red blood cells, platelets, plasma, or white blood cells.  Your temperature, blood pressure, and pulse will be checked often.  After the procedure, your blood may be tested to see how you are responding. This information is not intended to replace advice given to you by your health care provider. Make sure you discuss any questions you have with your health care provider. Document Revised: 02/08/2019 Document Reviewed: 02/08/2019 Elsevier Patient Education  [place].   Dehydration, Adult Dehydration  is condition in which there is not enough water or other fluids in the body. This happens when a person loses more fluids than he or she takes in. Important body parts cannot work right without the right amount of fluids. Any loss of fluids from the body can cause dehydration. Dehydration can be mild, worse, or very bad. It should be treated right away to keep it from getting very bad. What are the causes? This condition may be caused by: Conditions that cause loss of water or other fluids, such as: Watery poop (diarrhea). Vomiting. Sweating a lot. Peeing (urinating) a lot. Not drinking enough fluids, especially when you: Are ill. Are doing things that take a lot of energy to do. Other illnesses and conditions, such as fever or infection. Certain medicines, such as medicines that take extra fluid out of the body (diuretics). Lack of safe drinking water. Not being able to get enough water and food. What increases the risk? The following factors may make you more likely to develop this condition: Having a long-term (chronic) illness that has not been treated the right way, such as: Diabetes. Heart disease. Kidney disease. Being 53 years of age or older. Having a disability. Living in a  place that is high above the ground or sea (high in altitude). The thinner, dried air causes more fluid loss. Doing exercises that put stress on your body for a long time. What are the signs or symptoms? Symptoms of dehydration depend on how bad it is. Mild or worse dehydration Thirst. Dry lips or dry mouth. Feeling dizzy or light-headed, especially when you stand up from sitting. Muscle cramps. Your body making: Dark pee (urine). Pee may be the color of tea. Less pee than normal. Less tears than normal. Headache. Very bad dehydration Changes in skin. Skin may: Be cold to the touch (clammy). Be blotchy or pale. Not go back to normal right after you lightly pinch it and let it go. Little or no tears, pee, or sweat. Changes in vital signs, such as: Fast breathing. Low blood pressure. Weak pulse. Pulse that is more than 100 beats a minute when you are sitting still. Other changes, such as: Feeling very thirsty. Eyes that look hollow (sunken). Cold hands and feet. Being mixed up (confused). Being very tired (lethargic) or having trouble waking from sleep. Short-term weight loss. Loss of consciousness. How is this treated? Treatment for this condition depends on how bad it is. Treatment should start right away. Do not wait until your condition gets very bad. Very bad dehydration is an emergency. You will need to go to a hospital. Mild or worse dehydration can be treated at home. You may be asked to: Drink more fluids. Drink an oral rehydration solution (ORS). This drink helps get the right amounts of fluids and salts and minerals in the blood (electrolytes). Very bad dehydration can be treated: With fluids through an IV tube. By getting normal levels of salts and minerals in your blood. This is often done by giving salts and minerals through a tube. The tube is passed through your nose and into your stomach. By treating the root cause. Follow these instructions at home: Oral  rehydration solution If told by your doctor, drink an ORS: Make an ORS. Use instructions on the package. Start by drinking small amounts, about  cup (120 mL) every 5-10 minutes. Slowly drink more until you have had the amount that your doctor said to have. Eating and drinking  Drink enough clear fluid to keep your pee pale yellow. If you were told to drink an ORS, finish the ORS first. Then, start slowly drinking other clear fluids. Drink fluids such as: Water. Do not drink only water. Doing that can make the salt (sodium) level in your body get too low. Water from ice chips you suck on. Fruit juice that you have added water to (diluted). Low-calorie sports drinks. Eat foods that have the right amounts of salts and minerals, such as: Bananas. Oranges. Potatoes. Tomatoes. Spinach. Do not drink alcohol. Avoid: Drinks that have a lot of sugar. These include: High-calorie sports drinks. Fruit juice that you did not add water to. Soda. Caffeine. Foods that are greasy or have a lot of fat or sugar. General instructions Take over-the-counter and prescription medicines only as told by your doctor. Do not take salt tablets. Doing that can make the salt level in your body get too high. Return to your normal activities as told by your doctor. Ask your doctor what activities are safe for you. Keep all follow-up visits as told by your doctor. This is important. Contact a doctor if: You have pain in your belly (abdomen) and the pain: Gets worse. Stays in one place. You have a rash. You have a stiff neck. You get angry or annoyed (irritable) more easily than normal. You are more tired or have a harder time waking than normal. You feel: Weak or dizzy. Very thirsty. Get help right away if you have: Any symptoms of very bad dehydration. Symptoms of vomiting, such as: You cannot eat or drink without vomiting. Your vomiting gets worse or does not go away. Your vomit has blood or  green stuff in it. Symptoms that get worse with treatment. A fever. A very bad headache. Problems with peeing or pooping (having a bowel movement), such as: Watery poop that gets worse or does not go away. Blood in your poop (stool). This may cause poop to look black and tarry. Not peeing in 6-8 hours. Peeing only a small amount of very dark pee in 6-8 hours. Trouble breathing. These symptoms may be an emergency. Do not wait to see if the symptoms will go away. Get medical help right away. Call your local emergency services (911 in the [place] Do not drive yourself to the hospital. Summary Dehydration is a condition in which there is not enough water or other fluids in the body. This happens when a person loses more fluids than he or she takes in. Treatment for this condition depends on how bad it is. Treatment should be started right away. Do not wait until your condition gets very bad. Drink enough clear fluid to keep your pee pale yellow. If you were told to drink an oral rehydration solution (ORS), finish the ORS first. Then, start slowly drinking other clear fluids. Take over-the-counter and prescription medicines only as told by your doctor. Get help right away if you have any symptoms of very bad dehydration. This information is not intended to replace advice given to you by your health care provider. Make sure you discuss any questions you have with your health care provider. Document Revised: 03/29/2019 Document Reviewed: 03/29/2019 Elsevier Patient Education  [place].

## 2020-02-08 ENCOUNTER — Other Ambulatory Visit: Payer: Self-pay

## 2020-02-08 ENCOUNTER — Inpatient Hospital Stay: Payer: Medicaid Other

## 2020-02-08 ENCOUNTER — Telehealth: Payer: Self-pay | Admitting: Family Medicine

## 2020-02-08 ENCOUNTER — Telehealth: Payer: Self-pay | Admitting: Oncology

## 2020-02-08 VITALS — BP 136/81 | HR 99 | Temp 98.1°F | Resp 22

## 2020-02-08 DIAGNOSIS — N179 Acute kidney failure, unspecified: Secondary | ICD-10-CM

## 2020-02-08 DIAGNOSIS — Z5111 Encounter for antineoplastic chemotherapy: Secondary | ICD-10-CM | POA: Diagnosis not present

## 2020-02-08 DIAGNOSIS — I10 Essential (primary) hypertension: Secondary | ICD-10-CM

## 2020-02-08 LAB — TYPE AND SCREEN
ABO/RH(D): A POS
Antibody Screen: NEGATIVE
Unit division: 0

## 2020-02-08 LAB — BPAM RBC
Blood Product Expiration Date: 202106142359
ISSUE DATE / TIME: 202106100928
Unit Type and Rh: 600

## 2020-02-08 MED ORDER — SODIUM CHLORIDE 0.9 % IV SOLN
Freq: Once | INTRAVENOUS | Status: AC
  Filled 2020-02-08: qty 250

## 2020-02-08 MED ORDER — HEPARIN SOD (PORK) LOCK FLUSH 100 UNIT/ML IV SOLN
500.0000 [IU] | Freq: Once | INTRAVENOUS | Status: AC
  Administered 2020-02-08: 500 [IU] via INTRAVENOUS
  Filled 2020-02-08: qty 5

## 2020-02-08 MED ORDER — SODIUM CHLORIDE 0.9% FLUSH
10.0000 mL | INTRAVENOUS | Status: DC | PRN
  Administered 2020-02-08: 10 mL via INTRAVENOUS
  Filled 2020-02-08: qty 10

## 2020-02-08 NOTE — Telephone Encounter (Signed)
Scheduled appts per 6/10 los. Left voicemail with appt date and time.

## 2020-02-08 NOTE — Telephone Encounter (Signed)
Rep called in to request for a bp cuff for the patient. Please follow up at your earliest convenience.

## 2020-02-10 ENCOUNTER — Encounter (HOSPITAL_COMMUNITY): Payer: Self-pay

## 2020-02-10 ENCOUNTER — Other Ambulatory Visit: Payer: Self-pay

## 2020-02-10 ENCOUNTER — Inpatient Hospital Stay (HOSPITAL_COMMUNITY)
Admission: AD | Admit: 2020-02-10 | Discharge: 2020-02-14 | DRG: 871 | Disposition: A | Discharge status: Home / Self Care | Payer: Medicaid Other | Attending: Family Medicine | Admitting: Family Medicine

## 2020-02-10 ENCOUNTER — Emergency Department (HOSPITAL_BASED_OUTPATIENT_CLINIC_OR_DEPARTMENT_OTHER): Payer: Medicaid Other

## 2020-02-10 ENCOUNTER — Emergency Department (HOSPITAL_COMMUNITY): Payer: Medicaid Other

## 2020-02-10 DIAGNOSIS — G4489 Other headache syndrome: Secondary | ICD-10-CM | POA: Diagnosis not present

## 2020-02-10 DIAGNOSIS — F419 Anxiety disorder, unspecified: Secondary | ICD-10-CM | POA: Diagnosis present

## 2020-02-10 DIAGNOSIS — F1094 Alcohol use, unspecified with alcohol-induced mood disorder: Secondary | ICD-10-CM | POA: Diagnosis not present

## 2020-02-10 DIAGNOSIS — M79672 Pain in left foot: Secondary | ICD-10-CM

## 2020-02-10 DIAGNOSIS — N1831 Chronic kidney disease, stage 3a: Secondary | ICD-10-CM | POA: Diagnosis present

## 2020-02-10 DIAGNOSIS — A419 Sepsis, unspecified organism: Secondary | ICD-10-CM | POA: Diagnosis not present

## 2020-02-10 DIAGNOSIS — L03116 Cellulitis of left lower limb: Secondary | ICD-10-CM | POA: Diagnosis present

## 2020-02-10 DIAGNOSIS — K7010 Alcoholic hepatitis without ascites: Secondary | ICD-10-CM | POA: Diagnosis present

## 2020-02-10 DIAGNOSIS — D638 Anemia in other chronic diseases classified elsewhere: Secondary | ICD-10-CM | POA: Diagnosis present

## 2020-02-10 DIAGNOSIS — Z888 Allergy status to other drugs, medicaments and biological substances status: Secondary | ICD-10-CM

## 2020-02-10 DIAGNOSIS — T504X5A Adverse effect of drugs affecting uric acid metabolism, initial encounter: Secondary | ICD-10-CM | POA: Diagnosis not present

## 2020-02-10 DIAGNOSIS — M7989 Other specified soft tissue disorders: Secondary | ICD-10-CM

## 2020-02-10 DIAGNOSIS — Z66 Do not resuscitate: Secondary | ICD-10-CM | POA: Diagnosis present

## 2020-02-10 DIAGNOSIS — I129 Hypertensive chronic kidney disease with stage 1 through stage 4 chronic kidney disease, or unspecified chronic kidney disease: Secondary | ICD-10-CM | POA: Diagnosis present

## 2020-02-10 DIAGNOSIS — F1210 Cannabis abuse, uncomplicated: Secondary | ICD-10-CM | POA: Diagnosis present

## 2020-02-10 DIAGNOSIS — Y92230 Patient room in hospital as the place of occurrence of the external cause: Secondary | ICD-10-CM | POA: Diagnosis not present

## 2020-02-10 DIAGNOSIS — R1084 Generalized abdominal pain: Secondary | ICD-10-CM | POA: Diagnosis not present

## 2020-02-10 DIAGNOSIS — I10 Essential (primary) hypertension: Secondary | ICD-10-CM | POA: Diagnosis present

## 2020-02-10 DIAGNOSIS — I161 Hypertensive emergency: Secondary | ICD-10-CM | POA: Diagnosis present

## 2020-02-10 DIAGNOSIS — Z9012 Acquired absence of left breast and nipple: Secondary | ICD-10-CM | POA: Diagnosis not present

## 2020-02-10 DIAGNOSIS — R000 Tachycardia, unspecified: Secondary | ICD-10-CM | POA: Diagnosis not present

## 2020-02-10 DIAGNOSIS — Z20822 Contact with and (suspected) exposure to covid-19: Secondary | ICD-10-CM | POA: Diagnosis present

## 2020-02-10 DIAGNOSIS — D611 Drug-induced aplastic anemia: Secondary | ICD-10-CM | POA: Diagnosis present

## 2020-02-10 DIAGNOSIS — R079 Chest pain, unspecified: Secondary | ICD-10-CM | POA: Diagnosis not present

## 2020-02-10 DIAGNOSIS — Z79899 Other long term (current) drug therapy: Secondary | ICD-10-CM | POA: Diagnosis not present

## 2020-02-10 DIAGNOSIS — M109 Gout, unspecified: Secondary | ICD-10-CM | POA: Diagnosis present

## 2020-02-10 DIAGNOSIS — M25472 Effusion, left ankle: Secondary | ICD-10-CM | POA: Diagnosis not present

## 2020-02-10 DIAGNOSIS — M25475 Effusion, left foot: Secondary | ICD-10-CM | POA: Diagnosis not present

## 2020-02-10 DIAGNOSIS — T451X5A Adverse effect of antineoplastic and immunosuppressive drugs, initial encounter: Secondary | ICD-10-CM | POA: Diagnosis present

## 2020-02-10 DIAGNOSIS — Z6841 Body Mass Index (BMI) 40.0 and over, adult: Secondary | ICD-10-CM

## 2020-02-10 DIAGNOSIS — Z170 Estrogen receptor positive status [ER+]: Secondary | ICD-10-CM | POA: Diagnosis not present

## 2020-02-10 DIAGNOSIS — R0602 Shortness of breath: Secondary | ICD-10-CM | POA: Diagnosis not present

## 2020-02-10 DIAGNOSIS — M1990 Unspecified osteoarthritis, unspecified site: Secondary | ICD-10-CM | POA: Diagnosis present

## 2020-02-10 DIAGNOSIS — R0789 Other chest pain: Secondary | ICD-10-CM | POA: Diagnosis not present

## 2020-02-10 DIAGNOSIS — F1020 Alcohol dependence, uncomplicated: Secondary | ICD-10-CM

## 2020-02-10 DIAGNOSIS — C50412 Malignant neoplasm of upper-outer quadrant of left female breast: Secondary | ICD-10-CM | POA: Diagnosis present

## 2020-02-10 DIAGNOSIS — I517 Cardiomegaly: Secondary | ICD-10-CM | POA: Diagnosis not present

## 2020-02-10 DIAGNOSIS — K521 Toxic gastroenteritis and colitis: Secondary | ICD-10-CM | POA: Diagnosis not present

## 2020-02-10 LAB — CBC WITH DIFFERENTIAL/PLATELET
Abs Immature Granulocytes: 5.37 10*3/uL — ABNORMAL HIGH (ref 0.00–0.07)
Basophils Absolute: 0.1 10*3/uL (ref 0.0–0.1)
Basophils Relative: 0 %
Eosinophils Absolute: 0.2 10*3/uL (ref 0.0–0.5)
Eosinophils Relative: 1 %
HCT: 24.8 % — ABNORMAL LOW (ref 36.0–46.0)
Hemoglobin: 8.4 g/dL — ABNORMAL LOW (ref 12.0–15.0)
Immature Granulocytes: 22 %
Lymphocytes Relative: 6 %
Lymphs Abs: 1.4 10*3/uL (ref 0.7–4.0)
MCH: 31.5 pg (ref 26.0–34.0)
MCHC: 33.9 g/dL (ref 30.0–36.0)
MCV: 92.9 fL (ref 80.0–100.0)
Monocytes Absolute: 0.1 10*3/uL (ref 0.1–1.0)
Monocytes Relative: 1 %
Neutro Abs: 17.2 10*3/uL — ABNORMAL HIGH (ref 1.7–7.7)
Neutrophils Relative %: 70 %
Platelets: 184 10*3/uL (ref 150–400)
RBC: 2.67 MIL/uL — ABNORMAL LOW (ref 3.87–5.11)
RDW: 15.1 % (ref 11.5–15.5)
WBC: 24.4 10*3/uL — ABNORMAL HIGH (ref 4.0–10.5)
nRBC: 0 % (ref 0.0–0.2)

## 2020-02-10 LAB — RAPID URINE DRUG SCREEN, HOSP PERFORMED
Amphetamines: NOT DETECTED
Barbiturates: NOT DETECTED
Benzodiazepines: NOT DETECTED
Cocaine: NOT DETECTED
Opiates: NOT DETECTED
Tetrahydrocannabinol: POSITIVE — AB

## 2020-02-10 LAB — COMPREHENSIVE METABOLIC PANEL
ALT: 29 U/L (ref 0–44)
AST: 43 U/L — ABNORMAL HIGH (ref 15–41)
Albumin: 3.2 g/dL — ABNORMAL LOW (ref 3.5–5.0)
Alkaline Phosphatase: 73 U/L (ref 38–126)
Anion gap: 13 (ref 5–15)
BUN: 24 mg/dL — ABNORMAL HIGH (ref 8–23)
CO2: 23 mmol/L (ref 22–32)
Calcium: 7.8 mg/dL — ABNORMAL LOW (ref 8.9–10.3)
Chloride: 103 mmol/L (ref 98–111)
Creatinine, Ser: 1.21 mg/dL — ABNORMAL HIGH (ref 0.44–1.00)
GFR calc Af Amer: 55 mL/min — ABNORMAL LOW (ref >60)
GFR calc non Af Amer: 47 mL/min — ABNORMAL LOW (ref >60)
Glucose, Bld: 116 mg/dL — ABNORMAL HIGH (ref 70–99)
Potassium: 3.8 mmol/L (ref 3.5–5.1)
Sodium: 139 mmol/L (ref 135–145)
Total Bilirubin: 1 mg/dL (ref 0.3–1.2)
Total Protein: 7.6 g/dL (ref 6.5–8.1)

## 2020-02-10 LAB — CBC
HCT: 25.9 % — ABNORMAL LOW (ref 36.0–46.0)
Hemoglobin: 8.5 g/dL — ABNORMAL LOW (ref 12.0–15.0)
MCH: 31.4 pg (ref 26.0–34.0)
MCHC: 32.8 g/dL (ref 30.0–36.0)
MCV: 95.6 fL (ref 80.0–100.0)
Platelets: 175 10*3/uL (ref 150–400)
RBC: 2.71 MIL/uL — ABNORMAL LOW (ref 3.87–5.11)
RDW: 15.4 % (ref 11.5–15.5)
WBC: 22.9 10*3/uL — ABNORMAL HIGH (ref 4.0–10.5)
nRBC: 0 % (ref 0.0–0.2)

## 2020-02-10 LAB — ETHANOL: Alcohol, Ethyl (B): <10 mg/dL (ref <10)

## 2020-02-10 LAB — PROTIME-INR
INR: 1.2 (ref 0.8–1.2)
Prothrombin Time: 14.4 seconds (ref 11.4–15.2)

## 2020-02-10 LAB — LIPID PANEL
Cholesterol: 216 mg/dL — ABNORMAL HIGH (ref 0–200)
HDL: 100 mg/dL (ref >40)
LDL Cholesterol: 96 mg/dL (ref 0–99)
Total CHOL/HDL Ratio: 2.2 RATIO
Triglycerides: 102 mg/dL (ref <150)
VLDL: 20 mg/dL (ref 0–40)

## 2020-02-10 LAB — CREATININE, SERUM
Creatinine, Ser: 1.28 mg/dL — ABNORMAL HIGH (ref 0.44–1.00)
GFR calc Af Amer: 51 mL/min — ABNORMAL LOW (ref >60)
GFR calc non Af Amer: 44 mL/min — ABNORMAL LOW (ref >60)

## 2020-02-10 LAB — LACTIC ACID, PLASMA
Lactic Acid, Venous: 1.6 mmol/L (ref 0.5–1.9)
Lactic Acid, Venous: 1.3 mmol/L (ref 0.5–1.9)

## 2020-02-10 LAB — HEMOGLOBIN A1C
Hgb A1c MFr Bld: 5.7 % — ABNORMAL HIGH (ref 4.8–5.6)
Mean Plasma Glucose: 116.89 mg/dL

## 2020-02-10 LAB — APTT: aPTT: 39 seconds — ABNORMAL HIGH (ref 24–36)

## 2020-02-10 LAB — SARS CORONAVIRUS 2 BY RT PCR (HOSPITAL ORDER, PERFORMED IN ~~LOC~~ HOSPITAL LAB): SARS Coronavirus 2: NEGATIVE

## 2020-02-10 IMAGING — DX DG CHEST 1V PORT
1 series · 1 of 1 positions shown · non-contrast
Comparison: [DATE]

CLINICAL DATA: Breast carcinoma with recent blood transfusion.
Concern for potential sepsis

EXAM:
PORTABLE CHEST 1 VIEW

[chest ap]
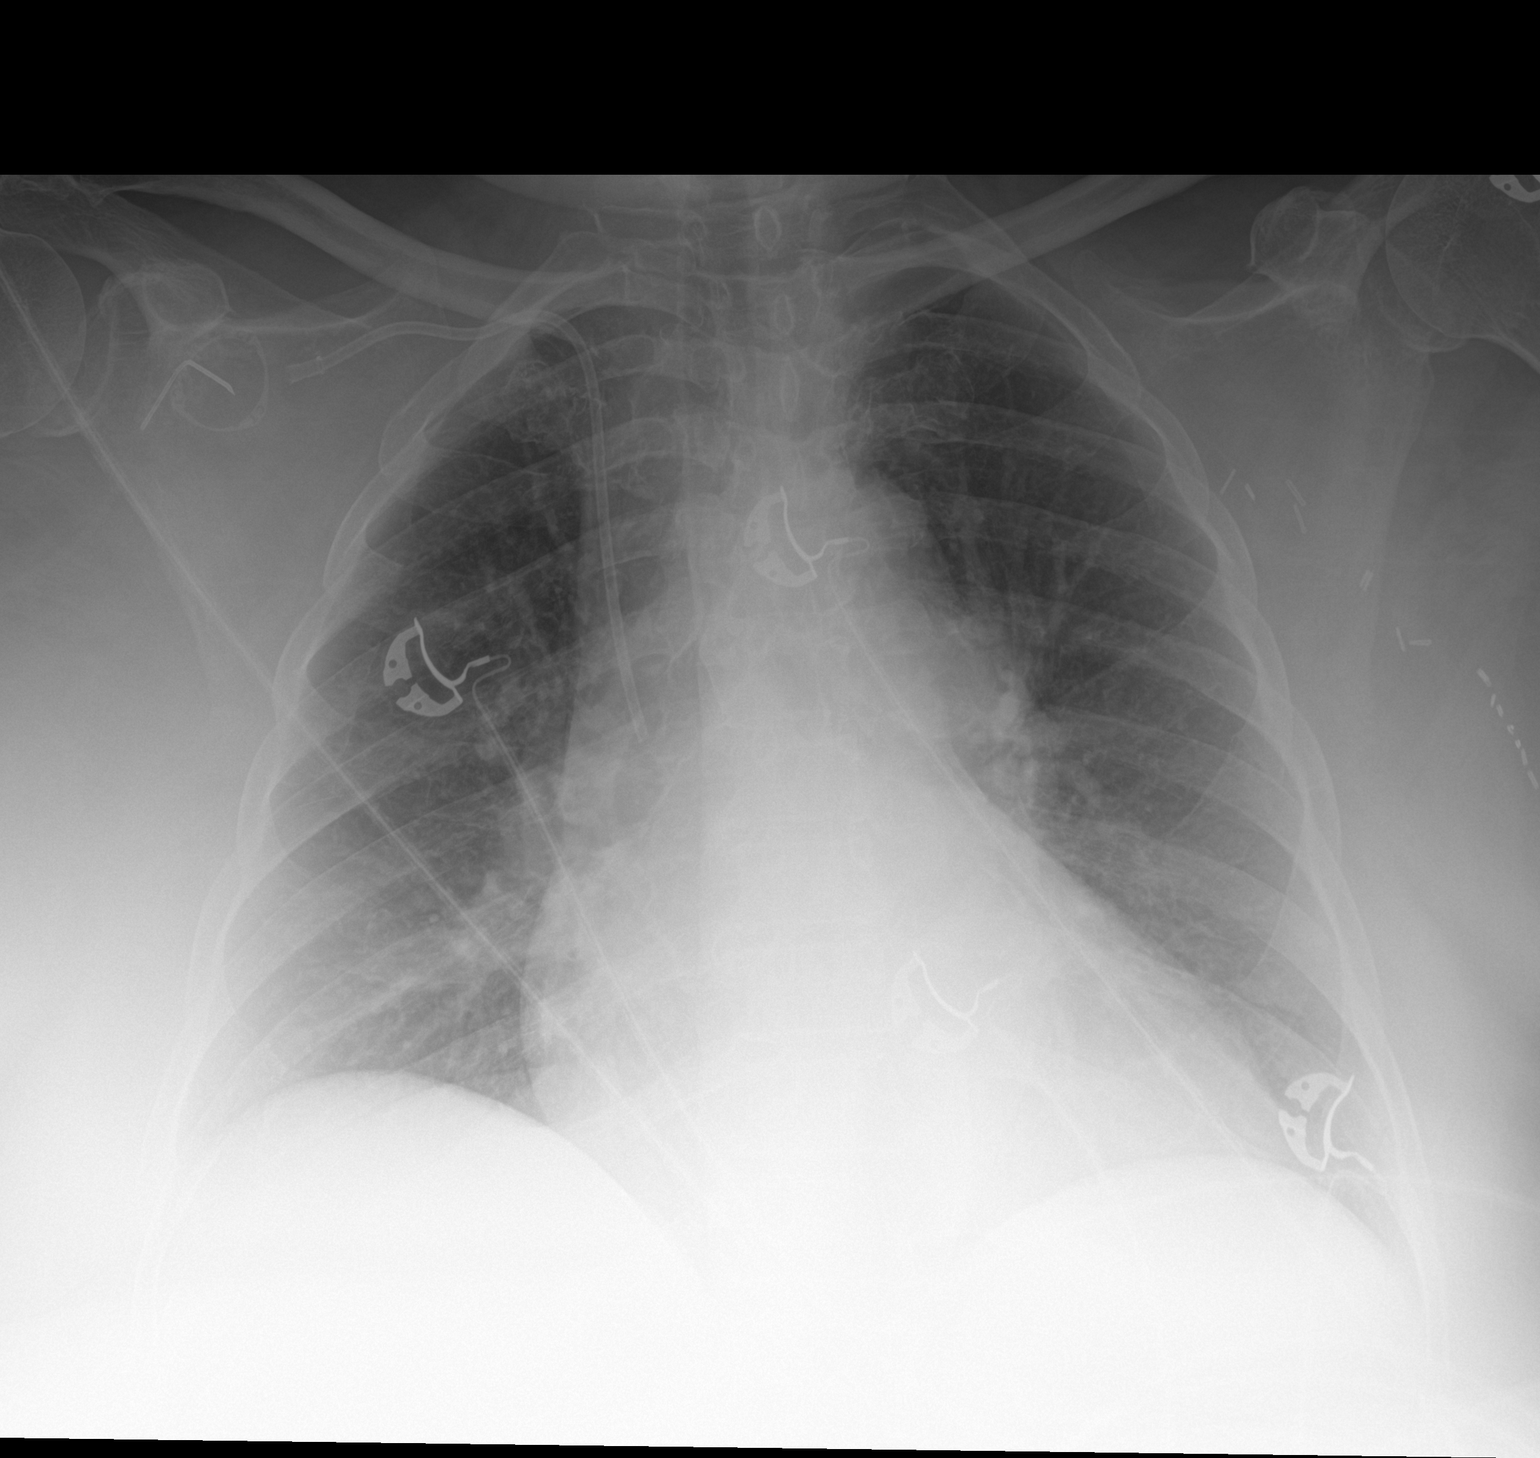

[1 of 1 positions shown; findings below may reference images not displayed]

FINDINGS: Port-A-Cath tip is in the superior vena cava. No pneumothorax. The
lungs are clear. The heart is mildly enlarged with pulmonary
vascularity normal. No adenopathy. There are surgical clips in the
left axillary region.
IMPRESSION: Port-A-Cath as described without pneumothorax. Lungs clear. Mild
cardiomegaly. No evident adenopathy.

## 2020-02-10 MED ORDER — ASPIRIN EC 81 MG PO TBEC
81.0000 mg | DELAYED_RELEASE_TABLET | Freq: Every day | ORAL | Status: DC
  Administered 2020-02-10 – 2020-02-14 (×5): 81 mg via ORAL
  Filled 2020-02-10 (×5): qty 1

## 2020-02-10 MED ORDER — MORPHINE SULFATE (PF) 2 MG/ML IV SOLN
2.0000 mg | Freq: Once | INTRAVENOUS | Status: AC
  Administered 2020-02-10: 2 mg via INTRAVENOUS
  Filled 2020-02-10: qty 1

## 2020-02-10 MED ORDER — ONDANSETRON HCL 4 MG/2ML IJ SOLN: 4.0000 mg | Freq: Four times a day (QID) | INTRAMUSCULAR | Status: DC | PRN

## 2020-02-10 MED ORDER — OXYCODONE HCL 5 MG PO TABS
5.0000 mg | ORAL_TABLET | ORAL | Status: DC | PRN
  Administered 2020-02-10: 5 mg via ORAL
  Filled 2020-02-10: qty 1

## 2020-02-10 MED ORDER — HYDROMORPHONE HCL 1 MG/ML IJ SOLN
1.0000 mg | Freq: Once | INTRAMUSCULAR | Status: AC
  Administered 2020-02-10: 1 mg via INTRAVENOUS
  Filled 2020-02-10: qty 1

## 2020-02-10 MED ORDER — PIPERACILLIN-TAZOBACTAM 3.375 G IVPB
3.3750 g | Freq: Three times a day (TID) | INTRAVENOUS | Status: DC
  Administered 2020-02-10 – 2020-02-12 (×5): 3.375 g via INTRAVENOUS
  Filled 2020-02-10 (×5): qty 50

## 2020-02-10 MED ORDER — VANCOMYCIN HCL IN DEXTROSE 1-5 GM/200ML-% IV SOLN: 1000.0000 mg | Freq: Once | INTRAVENOUS | Status: DC

## 2020-02-10 MED ORDER — CHLORHEXIDINE GLUCONATE CLOTH 2 % EX PADS
6.0000 | MEDICATED_PAD | Freq: Every day | CUTANEOUS | Status: DC
  Administered 2020-02-10 – 2020-02-14 (×5): 6 via TOPICAL

## 2020-02-10 MED ORDER — PIPERACILLIN-TAZOBACTAM 3.375 G IVPB 30 MIN: 3.3750 g | Freq: Once | INTRAVENOUS | Status: DC

## 2020-02-10 MED ORDER — SODIUM CHLORIDE 0.9 % IV BOLUS
1000.0000 mL | Freq: Once | INTRAVENOUS | Status: AC
  Administered 2020-02-10: 1000 mL via INTRAVENOUS

## 2020-02-10 MED ORDER — VANCOMYCIN HCL 2000 MG/400ML IV SOLN
2000.0000 mg | Freq: Once | INTRAVENOUS | Status: AC
  Administered 2020-02-10: 2000 mg via INTRAVENOUS
  Filled 2020-02-10: qty 400

## 2020-02-10 MED ORDER — ACETAMINOPHEN 325 MG PO TABS
650.0000 mg | ORAL_TABLET | Freq: Four times a day (QID) | ORAL | Status: DC | PRN
  Administered 2020-02-10: 650 mg via ORAL
  Filled 2020-02-10: qty 2

## 2020-02-10 MED ORDER — HEPARIN SODIUM (PORCINE) 5000 UNIT/ML IJ SOLN
5000.0000 [IU] | Freq: Three times a day (TID) | INTRAMUSCULAR | Status: DC
  Administered 2020-02-10 – 2020-02-14 (×12): 5000 [IU] via SUBCUTANEOUS
  Filled 2020-02-10 (×12): qty 1

## 2020-02-10 MED ORDER — SODIUM CHLORIDE 0.9 % IV SOLN
2.0000 g | Freq: Once | INTRAVENOUS | Status: AC
  Administered 2020-02-10: 2 g via INTRAVENOUS
  Filled 2020-02-10: qty 20

## 2020-02-10 MED ORDER — SODIUM CHLORIDE 0.9% FLUSH
10.0000 mL | Freq: Two times a day (BID) | INTRAVENOUS | Status: DC
  Administered 2020-02-10 – 2020-02-14 (×8): 10 mL

## 2020-02-10 MED ORDER — ORAL CARE MOUTH RINSE
15.0000 mL | Freq: Two times a day (BID) | OROMUCOSAL | Status: DC
  Administered 2020-02-11 – 2020-02-12 (×2): 15 mL via OROMUCOSAL

## 2020-02-10 MED ORDER — ACETAMINOPHEN 650 MG RE SUPP: 650.0000 mg | Freq: Four times a day (QID) | RECTAL | Status: DC | PRN

## 2020-02-10 MED ORDER — OXYCODONE HCL 5 MG PO TABS
10.0000 mg | ORAL_TABLET | ORAL | Status: DC | PRN
  Administered 2020-02-10 – 2020-02-11 (×3): 10 mg via ORAL
  Filled 2020-02-10 (×3): qty 2

## 2020-02-10 MED ORDER — METOPROLOL TARTRATE 25 MG PO TABS
12.5000 mg | ORAL_TABLET | Freq: Two times a day (BID) | ORAL | Status: DC
  Administered 2020-02-10 (×2): 12.5 mg via ORAL
  Filled 2020-02-10 (×2): qty 1

## 2020-02-10 MED ORDER — SORBITOL 70 % SOLN
30.0000 mL | Freq: Every day | Status: DC | PRN
  Filled 2020-02-10: qty 30

## 2020-02-10 MED ORDER — LORAZEPAM 2 MG/ML IJ SOLN
1.0000 mg | Freq: Four times a day (QID) | INTRAMUSCULAR | Status: DC | PRN
  Administered 2020-02-10 – 2020-02-11 (×3): 2 mg via INTRAVENOUS
  Filled 2020-02-10 (×3): qty 1

## 2020-02-10 MED ORDER — CHLORHEXIDINE GLUCONATE CLOTH 2 % EX PADS: 6.0000 | MEDICATED_PAD | Freq: Every day | CUTANEOUS | Status: DC

## 2020-02-10 MED ORDER — VANCOMYCIN HCL IN DEXTROSE 1-5 GM/200ML-% IV SOLN
1000.0000 mg | Freq: Two times a day (BID) | INTRAVENOUS | Status: DC
  Administered 2020-02-10 – 2020-02-11 (×3): 1000 mg via INTRAVENOUS
  Filled 2020-02-10 (×3): qty 200

## 2020-02-10 MED ORDER — SODIUM CHLORIDE 0.9% FLUSH: 10.0000 mL | INTRAVENOUS | Status: DC | PRN

## 2020-02-10 MED ORDER — NITROGLYCERIN IN D5W 200-5 MCG/ML-% IV SOLN
0.0000 ug/min | INTRAVENOUS | Status: DC
  Administered 2020-02-10: 5 ug/min via INTRAVENOUS
  Filled 2020-02-10: qty 250

## 2020-02-10 NOTE — ED Notes (Signed)
Provider is aware of the patient blood pressure.

## 2020-02-10 NOTE — Progress Notes (Signed)
A consult was received from an ED physician for vancomycin per pharmacy dosing.  The patient's profile has been reviewed for ht/wt/allergies/indication/available labs.   A one time order has been placed for vancomycin 2gm.    Further antibiotics/pharmacy consults should be ordered by admitting physician if indicated.                       Thank you, Dolly Rias RPh 02/10/2020, 9:38 AM

## 2020-02-10 NOTE — Progress Notes (Signed)
Pharmacy Antibiotic Note  [name] is a 64 y.o. female admitted on 02/10/2020 with cellulitis, currently undergoing chemotherapy for breast cancer.  Pharmacy has been consulted for vancomycin and zosyn dosing.  Plan: Vancomycin 2gm x 1 then 1gm IV q12h (trough goal 10-15) Zosyn 3.375g IV Q8H infused over 4hrs. Daily Scr Follow renal function and clinical course  Height: 5\' 1"  (154.9 cm) Weight: (!) 139.3 kg (307 lb) IBW/kg (Calculated) : 47.8  Temp (24hrs), Avg:99.3 F (37.4 C), Min:98.9 F (37.2 C), Max:99.7 F (37.6 C)  Recent Labs  Lab 02/05/20 0901 02/10/20 0853 02/10/20 1004  WBC 7.0 24.4*  --   CREATININE 1.54* 1.21*  --   LATICACIDVEN  --  1.6 1.3    Estimated Creatinine Clearance: 62.6 mL/min (A) (by C-G formula based on SCr of 1.21 mg/dL (H)).    Allergies  Allergen Reactions  . Amlodipine Swelling    BLE edema    Antimicrobials this admission: 6/13 CTX x1 6/13 vanc >> 6/13 zosyn >> Dose adjustments this admission:    Thank you for allowing pharmacy to be a part of this patient's care.  Dolly Rias RPh 02/10/2020, 1:47 PM

## 2020-02-10 NOTE — ED Provider Notes (Signed)
[facility] DEPT Provider Note   CSN: [id number] Arrival date & time: 02/10/20  5361     History No chief complaint on file.   [name] is a 64 y.o. female.  HPI   Patient is a 64 year old female with a medical history as noted below. She presents due to left lower extremity pain.  Patient states yesterday she began experiencing worsening pain and swelling in the left ankle that began to extend up the lower left leg into the calf and tibial region.  She reports associated erythema.  She has a history of cancer of the left breast which has been treated with a total mastectomy and she is currently undergoing chemotherapy.  She additionally reports a history of lymphedema.  She denies a history of similar symptoms.  Her pain is 10/10.  She has been taking Tylenol without relief.  She denies any trauma to the region.  No fevers, chills, chest pain, shortness of breath, abdominal pain, nausea, vomiting, diarrhea, syncope.    Past Medical History:  Diagnosis Date  . Arthritis   . Cancer ([place]) 11/2019   Left breast  . Diverticulitis   . Family history of ovarian cancer   . Heart murmur    11/26/19 echo: Mild AS. AV mean gradient 12.0 mmHg; however, LVOT gradient 8 mmHg with intracvitary gradient-significant AS is not suspected  . Hypertension     Patient Active Problem List   Diagnosis Date Noted  . Acute renal failure (ARF) ([place]) 01/22/2020  . Acute hyponatremia 01/22/2020  . S/P left mastectomy 12/19/2019  . Genetic testing 11/30/2019  . Morbid obesity with BMI of 60.0-69.9, adult ([place]) 11/21/2019  . Family history of ovarian cancer   . Malignant neoplasm of upper-outer quadrant of left breast in female, estrogen receptor positive ([place]) 11/14/2019  . Fracture of radial head, right, closed 06/25/2019  . Frequent falls 01/10/2019  . Bilateral bunions 01/10/2019  . Toenail fungus 01/10/2019  . Alcoholic hepatitis 44/31/5400  . Edema 11/20/2018    . Alcohol-induced mood disorder ([place]) 11/01/2013  . Alcohol dependence ([place]) 11/01/2013  . Essential hypertension 06/20/2007  . LOW BACK PAIN 06/20/2007  . DIVERTICULOSIS, COLON 05/05/2007    Past Surgical History:  Procedure Laterality Date  . MASTECTOMY WITH AXILLARY LYMPH NODE DISSECTION Left 12/19/2019   Procedure: LEFT MASTECTOMY WITH AXILLARY LYMPH NODE DISSECTION;  Surgeon: [name], MD;  Location: [facility];  Service: General;  Laterality: Left;  Marland Kitchen MULTIPLE EXTRACTIONS WITH ALVEOLOPLASTY Bilateral 12/14/2019   Procedure: MULTIPLE EXTRACTION WITH ALVEOLOPLASTY;  Surgeon: [name], DDS;  Location: [facility];  Service: Oral Surgery;  Laterality: Bilateral;  . PORTACATH PLACEMENT N/A 12/19/2019   Procedure: INSERTION PORT-A-CATH WITH ULTRASOUND GUIDANCE;  Surgeon: [name], MD;  Location: [facility];  Service: General;  Laterality: N/A;  . RADIAL HEAD ARTHROPLASTY Right 06/25/2019   Procedure: RIGHT RADIAL HEAD ARTHROPLASTY;  Surgeon: [name], MD;  Location: [facility];  Service: Orthopedics;  Laterality: Right;  . TUBAL LIGATION       OB History   No obstetric history on file.     Family History  Problem Relation Age of Onset  . Hypertension Mother   . Dementia Mother   . Ovarian cancer Half-Sister 9  . Diabetes Half-Sister   . Stroke Half-Sister     Social History   Tobacco Use  . Smoking status: Never Smoker  . Smokeless tobacco: Never Used  Vaping Use  . Vaping Use: Never used  Substance Use  Topics  . Alcohol use: Yes    Alcohol/week: 3.0 standard drinks    Types: 1 Cans of beer, 2 Shots of liquor per week    Comment: daily  . Drug use: Yes    Types: Marijuana    Home Medications Prior to Admission medications   Medication Sig Start Date End Date Taking? Authorizing Provider  dexamethasone (DECADRON) 4 MG tablet Take 1 tablet (4 mg total) by mouth 2 (two) times daily with a meal. 02/05/20   Magrinat, Virgie Dad, MD  lidocaine-prilocaine (EMLA)  cream Apply 1 application topically as needed. 02/05/20   Magrinat, Virgie Dad, MD  ondansetron (ZOFRAN) 4 MG tablet Take 1 tablet (4 mg total) by mouth every 8 (eight) hours as needed for nausea or vomiting. 02/05/20   Magrinat, Virgie Dad, MD  prochlorperazine (COMPAZINE) 10 MG tablet Take 1 tablet (10 mg total) by mouth every 6 (six) hours as needed for nausea or vomiting. 02/05/20   Magrinat, Virgie Dad, MD    Allergies    Amlodipine  Review of Systems   Review of Systems  All other systems reviewed and are negative. Ten systems reviewed and are negative for acute change, except as noted in the HPI.   Physical Exam Updated Vital Signs BP (!) 227/140   Pulse (!) 134   Temp 98.9 F (37.2 C) (Oral)   Resp 18   Ht 5\' 1"  (1.549 m)   Wt (!) 139.3 kg   SpO2 99%   BMI 58.01 kg/m   Physical Exam Vitals and nursing note reviewed.  Constitutional:      General: She is in acute distress.     Appearance: Normal appearance. She is obese. She is not ill-appearing, toxic-appearing or diaphoretic.     Comments: Obese adult female.  She is well-developed.  She appears to be in significant pain.  HENT:     Head: Normocephalic and atraumatic.     Right Ear: External ear normal.     Left Ear: External ear normal.     Nose: Nose normal.     Mouth/Throat:     Mouth: Mucous membranes are moist.     Pharynx: Oropharynx is clear. No oropharyngeal exudate or posterior oropharyngeal erythema.  Eyes:     Extraocular Movements: Extraocular movements intact.  Cardiovascular:     Rate and Rhythm: Regular rhythm. Tachycardia present.     Pulses: Normal pulses.     Heart sounds: Murmur (Systolic murmur best heard at the right upper sternal border) heard.  No friction rub. No gallop.   Pulmonary:     Effort: Pulmonary effort is normal. No respiratory distress.     Breath sounds: Normal breath sounds. No stridor. No wheezing, rhonchi or rales.     Comments: Lungs clear to auscultation bilaterally.  Difficult to  assess secondary to body habitus.  Patient is phonating clearly and coherently.  She speaks in complete sentences. Abdominal:     Palpations: Abdomen is soft.     Tenderness: There is no abdominal tenderness.  Musculoskeletal:        General: Tenderness present. Normal range of motion.     Cervical back: Normal range of motion and neck supple. No tenderness.     Right lower leg: Edema present.     Left lower leg: Edema present.     Comments: Difficult to assess for quantity of edema in the left lower extremity secondary to body habitus.  There is edema noted in the left foot, ankle, calf  and tibial regions.  Erythema noted in the prior mentioned regions.  Exquisite tenderness noted in the prior mentioned regions.  Skin:    General: Skin is warm and dry.     Findings: Erythema present.  Neurological:     General: No focal deficit present.     Mental Status: She is alert and oriented to person, place, and time.    ED Results / Procedures / Treatments   Labs (all labs ordered are listed, but only abnormal results are displayed) Labs Reviewed  COMPREHENSIVE METABOLIC PANEL - Abnormal; Notable for the following components:      Result Value   Glucose, Bld 116 (*)    BUN 24 (*)    Creatinine, Ser 1.21 (*)    Calcium 7.8 (*)    Albumin 3.2 (*)    AST 43 (*)    GFR calc non Af Amer 47 (*)    GFR calc Af Amer 55 (*)    All other components within normal limits  CBC WITH DIFFERENTIAL/PLATELET - Abnormal; Notable for the following components:   WBC 24.4 (*)    RBC 2.67 (*)    Hemoglobin 8.4 (*)    HCT 24.8 (*)    Neutro Abs 17.2 (*)    Abs Immature Granulocytes 5.37 (*)    All other components within normal limits  APTT - Abnormal; Notable for the following components:   aPTT 39 (*)    All other components within normal limits  CULTURE, BLOOD (ROUTINE X 2)  CULTURE, BLOOD (ROUTINE X 2)  URINE CULTURE  LACTIC ACID, PLASMA  LACTIC ACID, PLASMA  PROTIME-INR  URINALYSIS, ROUTINE W  REFLEX MICROSCOPIC   EKG None  Radiology DG Chest Portable 1 View  Result Date: 02/10/2020 CLINICAL DATA:  Breast carcinoma with recent blood transfusion. Concern for potential sepsis EXAM: PORTABLE CHEST 1 VIEW COMPARISON:  December 19, 2019 FINDINGS: Port-A-Cath tip is in the superior vena cava. No pneumothorax. The lungs are clear. The heart is mildly enlarged with pulmonary vascularity normal. No adenopathy. There are surgical clips in the left axillary region. IMPRESSION: Port-A-Cath as described without pneumothorax. Lungs clear. Mild cardiomegaly. No evident adenopathy. Electronically Signed   By: Lowella Grip III M.D.   On: 02/10/2020 09:53   VAS [place] LOWER EXTREMITY VENOUS (DVT) (ONLY [facility] & [facility])  Result Date: 02/10/2020  Lower Venous DVTStudy Indications: Swelling.  Limitations: Body habitus. Comparison Study: 11/04/18 negative Performing Technologist: June Leap RDMS, RVT  Examination Guidelines: A complete evaluation includes B-mode imaging, spectral Doppler, color Doppler, and power Doppler as needed of all accessible portions of each vessel. Bilateral testing is considered an integral part of a complete examination. Limited examinations for reoccurring indications may be performed as noted. The reflux portion of the exam is performed with the patient in reverse Trendelenburg.  +---------+---------------+---------+-----------+----------+--------------+ LEFT     CompressibilityPhasicitySpontaneityPropertiesThrombus Aging +---------+---------------+---------+-----------+----------+--------------+ CFV      Full           Yes      Yes                                 +---------+---------------+---------+-----------+----------+--------------+ SFJ      Full                                                        +---------+---------------+---------+-----------+----------+--------------+  FV Prox  Full                                                         +---------+---------------+---------+-----------+----------+--------------+ FV Mid   Full                                                        +---------+---------------+---------+-----------+----------+--------------+ FV DistalFull                                                        +---------+---------------+---------+-----------+----------+--------------+ PFV                                                   Not visualized +---------+---------------+---------+-----------+----------+--------------+ POP      Full           Yes      Yes                                 +---------+---------------+---------+-----------+----------+--------------+ PTV      Full                                                        +---------+---------------+---------+-----------+----------+--------------+ PERO                                                  Not visualized +---------+---------------+---------+-----------+----------+--------------+     Summary: LEFT: - There is no evidence of deep vein thrombosis in the lower extremity. However, portions of this examination were limited- see technologist comments above.  *See table(s) above for measurements and observations. Electronically signed by Monica Martinez MD on 02/10/2020 at 11:15:19 AM.    Final     Procedures Procedures (including critical care time)  Medications Ordered in ED Medications  vancomycin (VANCOREADY) IVPB 2000 mg/400 mL (2,000 mg Intravenous New Bag/Given 02/10/20 1036)  HYDROmorphone (DILAUDID) injection 1 mg (1 mg Intravenous Given 02/10/20 0846)  cefTRIAXone (ROCEPHIN) 2 g in sodium chloride 0.9 % 100 mL IVPB (0 g Intravenous Stopped 02/10/20 1033)  HYDROmorphone (DILAUDID) injection 1 mg (1 mg Intravenous Given 02/10/20 1019)  sodium chloride 0.9 % bolus 1,000 mL (1,000 mLs Intravenous New Bag/Given 02/10/20 1020)   ED Course  I have reviewed the triage vital signs and the nursing notes.  Pertinent  labs & imaging results that were available during my care of the patient were reviewed by me and considered in my medical decision making (see chart for details).  Clinical Course as of  Feb 09 1300  Sun Feb 10, 2020  0932 Acutely elevated  WBC(!): 24.4 [LJ]  0932 Similar to baseline  Hemoglobin(!): 8.4 [LJ]  1107 Oral temp 98.9 Fahrenheit.  Rectal temp repeated at 99.7 Fahrenheit.  Temp: 99.7 F (37.6 C) [LJ]  1107 Elevated at 1.21 which is actually improved from baseline.  Creatinine(!): 1.21 [LJ]  1149 Neutrophils: 70 [LJ]    Clinical Course User Index [LJ] Rayna Sexton, PA-C   MDM Rules/Calculators/A&P                          Patient is a 64 year old female with a history of breast cancer who is currently undergoing chemotherapy.  Yesterday she started experiencing swelling and exquisite pain in the left ankle and lower leg.  There is diffuse erythema in the prior mentioned region.  Exam concerning for cellulitis versus DVT.  Ultrasound of the left lower extremity was obtained which was negative for DVT.  I discussed this patient with my attending physician Dr. Varney Biles who also evaluated the patient.  We agreed that this patient is likely septic and believe it is possibly secondary to cellulitis on the left lower extremity.  Her chest x-ray was negative for acute findings.  Leukocytosis noted at 24.4.  Neutrophilia of 17.2.  Creatinine elevated at 1.21 but actually improved from baseline.  Hemoglobin decreased at 8.4 which seems to be near patient's baseline.  Code sepsis was initiated.  Patient was given Rocephin as well as vancomycin.  Patient has received IV fluids.  She is still hypertensive and has been tachycardic in the 120s to 130s since arrival.  Per records, patient was on multiple nephrotoxic agents and all of her BP meds were stopped recently.  None of these were restarted.  Myself and my attending physician believe her hypertension is likely a combination of her  pain as well as not being on blood pressure controlling agents.  Her temperature was 98.9 Fahrenheit upon arrival and repeat rectally was 99.7 Fahrenheit.  She is still in a moderate amount of pain after 2 g of IV Dilaudid.  She has been saturating well throughout her stay.  She is not short of breath.  No chest pain.  We will discuss with hospitalist team for likely admission.  Patient was made aware of her likely admission and is amenable with this.  Note: Portions of this report may have been transcribed using voice recognition software. Every effort was made to ensure accuracy; however, inadvertent computerized transcription errors may be present.    Final Clinical Impression(s) / ED Diagnoses Final diagnoses:  Sepsis without acute organ dysfunction, due to unspecified organism [facility])   Rx / DC Orders ED Discharge Orders    None       Rayna Sexton, PA-C 02/10/20 [place], MD 02/10/20 1617

## 2020-02-10 NOTE — Progress Notes (Signed)
Requested medication for pain relief, current measures have been ineffective.Received one time dose for dilaudid. Will continue to assess.

## 2020-02-10 NOTE — Telephone Encounter (Signed)
[name] at [facility] can help if needed

## 2020-02-10 NOTE — H&P (Signed)
[facility] History and Physical  [name] JSH:[id number] DOB: 1955-09-30 DOA: 02/10/2020  Referring physician:  PCP: [name], MD   Chief Complaint: Cellulitis left lower extremity, hypertensive emergency  HPI: [name] is a 64 y.o.  BF PMHx Arthritis, Diverticulitis, Heart murmur, HTN, LEFT breast cancer invasive ductal carcinoma with prominent inflammatory response, grade 3. Prognostic indicators significant for: estrogen receptor, 100% positive and progesterone receptor, 50% positive, both with strong staining intensity. Proliferation marker Ki67 at 70%. HER2 negative, s/p LEFT Mastectomy, Morbid Obesity, EtOH dependence, EtOH induced mood disorder, EtOH hepatitis  She presents due to left lower extremity pain.  Patient states yesterday she began experiencing worsening pain and swelling in the left ankle that began to extend up the lower left leg into the calf and tibial region.  She reports associated erythema.  She has a history of cancer of the left breast which has been treated with a total mastectomy and she is currently undergoing chemotherapy.  She additionally reports a history of lymphedema.  She denies a history of similar symptoms.  Her pain is 10/10.  She has been taking Tylenol without relief.  She denies any trauma to the region.  No fevers, chills, chest pain, shortness of breath, abdominal pain, nausea, vomiting, diarrhea, syncope.   Review of Systems:  Covid vaccination;  Constitutional:  No weight loss, night sweats, Fevers, chills, fatigue.  HEENT:  No headaches, Difficulty swallowing,Tooth/dental problems,Sore throat,  No sneezing, itching, ear ache, nasal congestion, post nasal drip,  Cardio-vascular:  No chest pain, Orthopnea, PND, swelling in lower extremities, anasarca, dizziness, palpitations  GI:  No heartburn, indigestion, abdominal pain, nausea, vomiting, diarrhea, change in bowel habits, loss of appetite  Resp:  No shortness of breath  with exertion or at rest. No excess mucus, no productive cough, No non-productive cough, No coughing up of blood.No change in color of mucus.No wheezing.No chest wall deformity  Skin:  Positive left lower leg swelling, erythema, warm to touch GU:  no dysuria, change in color of urine, no urgency or frequency. No flank pain.  Musculoskeletal:  No joint pain or swelling. No decreased range of motion. No back pain.  Psych:  No change in mood or affect. No depression or anxiety. No memory loss.   Past Medical History:  Diagnosis Date  . Arthritis   . Cancer ([place]) 11/2019   Left breast  . Diverticulitis   . Family history of ovarian cancer   . Heart murmur    11/26/19 echo: Mild AS. AV mean gradient 12.0 mmHg; however, LVOT gradient 8 mmHg with intracvitary gradient-significant AS is not suspected  . Hypertension    Past Surgical History:  Procedure Laterality Date  . MASTECTOMY WITH AXILLARY LYMPH NODE DISSECTION Left 12/19/2019   Procedure: LEFT MASTECTOMY WITH AXILLARY LYMPH NODE DISSECTION;  Surgeon: [name], MD;  Location: [facility];  Service: General;  Laterality: Left;  Marland Kitchen MULTIPLE EXTRACTIONS WITH ALVEOLOPLASTY Bilateral 12/14/2019   Procedure: MULTIPLE EXTRACTION WITH ALVEOLOPLASTY;  Surgeon: [name], DDS;  Location: [place];  Service: Oral Surgery;  Laterality: Bilateral;  . PORTACATH PLACEMENT N/A 12/19/2019   Procedure: INSERTION PORT-A-CATH WITH ULTRASOUND GUIDANCE;  Surgeon: [name], MD;  Location: [place];  Service: General;  Laterality: N/A;  . RADIAL HEAD ARTHROPLASTY Right 06/25/2019   Procedure: RIGHT RADIAL HEAD ARTHROPLASTY;  Surgeon: [name], MD;  Location: [place];  Service: Orthopedics;  Laterality: Right;  . TUBAL LIGATION     Social History:  reports that she has  never smoked. She has never used smokeless tobacco. She reports current alcohol use of about 3.0 standard drinks of alcohol per week. She reports current drug use. Drug:  Marijuana.  Allergies  Allergen Reactions  . Amlodipine Swelling    BLE edema    Family History  Problem Relation Age of Onset  . Hypertension Mother   . Dementia Mother   . Ovarian cancer Half-Sister 96  . Diabetes Half-Sister   . Stroke Half-Sister     Prior to Admission medications   Medication Sig Start Date End Date Taking? Authorizing Provider  dexamethasone (DECADRON) 4 MG tablet Take 1 tablet (4 mg total) by mouth 2 (two) times daily with a meal. 02/05/20  Yes Magrinat, Virgie Dad, MD  lidocaine-prilocaine (EMLA) cream Apply 1 application topically as needed. 02/05/20  Yes Magrinat, Virgie Dad, MD  ondansetron (ZOFRAN) 4 MG tablet Take 1 tablet (4 mg total) by mouth every 8 (eight) hours as needed for nausea or vomiting. 02/05/20  Yes Magrinat, Virgie Dad, MD  prochlorperazine (COMPAZINE) 10 MG tablet Take 1 tablet (10 mg total) by mouth every 6 (six) hours as needed for nausea or vomiting. 02/05/20  Yes Magrinat, Virgie Dad, MD     Consultants:    Procedures/Significant Events:  6/13 LEFT lower extremity Doppler; negative DVT    I have personally reviewed and interpreted all radiology studies and my findings are as above.   VENTILATOR SETTINGS:    Cultures 5/25 SARS coronavirus negative   Antimicrobials: Anti-infectives (From admission, onward)   Start     Ordered Stop   02/10/20 2200  vancomycin (VANCOCIN) IVPB 1000 mg/200 mL premix     Discontinue     02/10/20 1341     02/10/20 1800  piperacillin-tazobactam (ZOSYN) IVPB 3.375 g     Discontinue     02/10/20 1349     02/10/20 1345  piperacillin-tazobactam (ZOSYN) IVPB 3.375 g  Status:  Discontinued        02/10/20 1333 02/10/20 1342   02/10/20 1345  vancomycin (VANCOCIN) IVPB 1000 mg/200 mL premix  Status:  Discontinued        02/10/20 1333 02/10/20 1341   02/10/20 0945  vancomycin (VANCOREADY) IVPB 2000 mg/400 mL        02/10/20 0937 02/10/20 1401   02/10/20 0930  vancomycin (VANCOCIN) IVPB 1000 mg/200 mL premix   Status:  Discontinued        02/10/20 0924 02/10/20 0937   02/10/20 0930  cefTRIAXone (ROCEPHIN) 2 g in sodium chloride 0.9 % 100 mL IVPB        02/10/20 0924 02/10/20 1033       Devices    LINES / TUBES:      Continuous Infusions:  Physical Exam: Vitals:   02/10/20 0950 02/10/20 1005 02/10/20 1012 02/10/20 1039  BP:      Pulse: (!) 52     Resp: (!) 24 (!) 25 19   Temp:    99.7 F (37.6 C)  TempSrc:    Rectal  SpO2: 96%     Weight:      Height:        Wt Readings from Last 3 Encounters:  02/10/20 (!) 139.3 kg  02/05/20 (!) 139.4 kg  01/27/20 (!) 142.7 kg    General: A/O x4, no acute respiratory distress Eyes: negative scleral hemorrhage, negative anisocoria, negative icterus ENT: Negative Runny nose, negative gingival bleeding, Neck:  Negative scars, masses, torticollis, lymphadenopathy, JVD Lungs: Tachypneic, clear to auscultation bilaterally without  wheezes or crackles Cardiovascular: Tachycardic, without murmur gallop or rub normal S1 and S2 Abdomen: MORBIDLY OBESE abdominal pain, nondistended, positive soft, bowel sounds, no rebound, no ascites, no appreciable mass Extremities: LEFT lower extremity mild venous stasis, erythematous, warm to touch, extremely painful Skin: Negative rashes, lesions, ulcers Psychiatric:  Negative depression, negative anxiety, negative fatigue, negative mania  Central nervous system:  Cranial nerves II through XII intact, tongue/uvula midline, all extremities muscle strength 5/5, sensation intact throughout, negative dysarthria, negative expressive aphasia, negative receptive aphasia.        Labs on Admission:  Basic Metabolic Panel: Recent Labs  Lab 02/05/20 0901 02/10/20 0853  NA 137 139  K 3.2* 3.8  CL 102 103  CO2 19* 23  GLUCOSE 97 116*  BUN 14 24*  CREATININE 1.54* 1.21*  CALCIUM 7.3* 7.8*   Liver Function Tests: Recent Labs  Lab 02/05/20 0901 02/10/20 0853  AST 35 43*  ALT 22 29  ALKPHOS 59 73  BILITOT  0.4 1.0  PROT 7.3 7.6  ALBUMIN 2.5* 3.2*   No results for input(s): LIPASE, AMYLASE in the last 168 hours. No results for input(s): AMMONIA in the last 168 hours. CBC: Recent Labs  Lab 02/05/20 0901 02/10/20 0853  WBC 7.0 24.4*  NEUTROABS 2.1 17.2*  HGB 7.5* 8.4*  HCT 22.6* 24.8*  MCV 93.8 92.9  PLT 235 184   Cardiac Enzymes: No results for input(s): CKTOTAL, CKMB, CKMBINDEX, TROPONINI in the last 168 hours.  BNP (last 3 results) No results for input(s): BNP in the last 8760 hours.  ProBNP (last 3 results) No results for input(s): PROBNP in the last 8760 hours.  CBG: No results for input(s): GLUCAP in the last 168 hours.  Radiological Exams on Admission: DG Chest Portable 1 View  Result Date: 02/10/2020 CLINICAL DATA:  Breast carcinoma with recent blood transfusion. Concern for potential sepsis EXAM: PORTABLE CHEST 1 VIEW COMPARISON:  December 19, 2019 FINDINGS: Port-A-Cath tip is in the superior vena cava. No pneumothorax. The lungs are clear. The heart is mildly enlarged with pulmonary vascularity normal. No adenopathy. There are surgical clips in the left axillary region. IMPRESSION: Port-A-Cath as described without pneumothorax. Lungs clear. Mild cardiomegaly. No evident adenopathy. Electronically Signed   By: Lowella Grip III M.D.   On: 02/10/2020 09:53   VAS [place] LOWER EXTREMITY VENOUS (DVT) (ONLY [facility] & [facility])  Result Date: 02/10/2020  Lower Venous DVTStudy Indications: Swelling.  Limitations: Body habitus. Comparison Study: 11/04/18 negative Performing Technologist: June Leap RDMS, RVT  Examination Guidelines: A complete evaluation includes B-mode imaging, spectral Doppler, color Doppler, and power Doppler as needed of all accessible portions of each vessel. Bilateral testing is considered an integral part of a complete examination. Limited examinations for reoccurring indications may be performed as noted. The reflux portion of the exam is performed with the patient in  reverse Trendelenburg.  +---------+---------------+---------+-----------+----------+--------------+ LEFT     CompressibilityPhasicitySpontaneityPropertiesThrombus Aging +---------+---------------+---------+-----------+----------+--------------+ CFV      Full           Yes      Yes                                 +---------+---------------+---------+-----------+----------+--------------+ SFJ      Full                                                        +---------+---------------+---------+-----------+----------+--------------+  FV Prox  Full                                                        +---------+---------------+---------+-----------+----------+--------------+ FV Mid   Full                                                        +---------+---------------+---------+-----------+----------+--------------+ FV DistalFull                                                        +---------+---------------+---------+-----------+----------+--------------+ PFV                                                   Not visualized +---------+---------------+---------+-----------+----------+--------------+ POP      Full           Yes      Yes                                 +---------+---------------+---------+-----------+----------+--------------+ PTV      Full                                                        +---------+---------------+---------+-----------+----------+--------------+ PERO                                                  Not visualized +---------+---------------+---------+-----------+----------+--------------+     Summary: LEFT: - There is no evidence of deep vein thrombosis in the lower extremity. However, portions of this examination were limited- see technologist comments above.  *See table(s) above for measurements and observations. Electronically signed by Monica Martinez MD on 02/10/2020 at 11:15:19 AM.    Final     EKG:  Independently reviewed.   Assessment/Plan Principal Problem:   Cellulitis of left lower extremity without foot Active Problems:   Essential hypertension   Alcohol-induced mood disorder (HCC)   Alcohol dependence (HCC)   Alcoholic hepatitis   Malignant neoplasm of upper-outer quadrant of left breast in female, estrogen receptor positive ([place])   Morbid obesity with BMI of 60.0-69.9, adult (HCC)   S/P left mastectomy   Hypertensive emergency   Hypersensitive Emergency/Essential HTN -States visited doctors office recently and had normal BP, some of this HTN most likely pain mediated -NTG drip. -6/13 Metoprolol 12.5 mg bid  Sepsis/LEFT lower extremity cellulitis -On admission patient meets criteria for sepsis HR> 90, WBC> 12, source of infection left lower extremity -Antibiotics x7 days -Pain control  LEFT breast cancer;invasive ductal carcinoma with  prominent inflammatory response, grade 3 -Oncologist Dr. Jana Hakim -Currently receivingadjuvant chemotherapy, consisting of cyclophosphamide and doxorubicin in dose dense fashion x4, on 02/05/2020.  Last dose of chemotherapy 02/07/2020  EtOH Mood DO  EtOH dependence -6/13 negative for EtOH  EtOH hepatitis  Drug abuse -6/13 urine tox screen positive for marijuana -  CKD stage IIIa (baseline Cr 1.28) -Hold all nephrotoxic medication -Monitor closely -Her current creatinine level is the best it has been in 2 months, which I am using as her baseline  Morbid obesity (BMI 60-69.9)  Anxiety -Ativan IV PRN    Code Status: DNR (DVT Prophylaxis: Subcu heparin Family Communication:   Status is: Inpatient    Dispo: The patient is from: Home              Anticipated d/c is to: Home              Anticipated d/c date is: 6/20              Patient currently unstable     Data Reviewed: Care during the described time interval was provided by me .  I have reviewed this patient's available data, including medical history, events of  note, physical examination, and all test results as part of my evaluation.   The patient is critically ill with multiple organ systems failure and requires high complexity decision making for assessment and support, frequent evaluation and titration of therapies, application of advanced monitoring technologies and extensive interpretation of multiple databases. Critical Care Time devoted to patient care services described in this note  Time spent: 41 minutes   Sadaf Przybysz, [facility] Pager [phone]

## 2020-02-10 NOTE — ED Notes (Signed)
ED TO INPATIENT HANDOFF REPORT  ED Nurse Name and Phone #: [phone]  S Name/Age/Gender [name] 64 y.o. female Room/Bed: WA07/WA07  Code Status   Code Status: DNR  Home/SNF/Other Home Patient oriented to: self, place, time and situation Is this baseline? Yes   Triage Complete: Triage complete  Chief Complaint Cellulitis of left lower extremity [E95.284]  Triage Note Patient coming from home with c/o left ankle swelling and hot to touch. No deformity. Patient denies fall/injury to the left ankle. Breast cancer patient last chemo was Tue and blood transfusion on Thurs.    Allergies Allergies  Allergen Reactions  . Amlodipine Swelling    BLE edema    Level of Care/Admitting Diagnosis ED Disposition    ED Disposition Condition [facility] Area: [place] [100102]  Level of Care: Stepdown [14]  Admit to SDU based on following criteria: Cardiac Instability:  Patients experiencing chest pain, unconfirmed MI and stable, arrhythmias and CHF requiring medical management and potentially compromising patient's stability  May admit patient to [facility] or [facility] if equivalent level of care is available:: Yes  Covid Evaluation: Confirmed COVID Negative  Admission Type: Urgent [2]  Diagnosis: Cellulitis of left lower extremity [132440]  Admitting Physician: Allie Bossier [[id number]]  Attending Physician: Allie Bossier [[id number]]  Estimated length of stay: 3 - 4 days  Certification:: I certify this patient will need inpatient services for at least 2 midnights       B Medical/Surgery History Past Medical History:  Diagnosis Date  . Arthritis   . Cancer ([place]) 11/2019   Left breast  . Diverticulitis   . Family history of ovarian cancer   . Heart murmur    11/26/19 echo: Mild AS. AV mean gradient 12.0 mmHg; however, LVOT gradient 8 mmHg with intracvitary gradient-significant AS is not suspected  . Hypertension    Past Surgical  History:  Procedure Laterality Date  . MASTECTOMY WITH AXILLARY LYMPH NODE DISSECTION Left 12/19/2019   Procedure: LEFT MASTECTOMY WITH AXILLARY LYMPH NODE DISSECTION;  Surgeon: [name], MD;  Location: [facility];  Service: General;  Laterality: Left;  Marland Kitchen MULTIPLE EXTRACTIONS WITH ALVEOLOPLASTY Bilateral 12/14/2019   Procedure: MULTIPLE EXTRACTION WITH ALVEOLOPLASTY;  Surgeon: [name], DDS;  Location: [facility] [place];  Service: Oral Surgery;  Laterality: Bilateral;  . PORTACATH PLACEMENT N/A 12/19/2019   Procedure: INSERTION PORT-A-CATH WITH ULTRASOUND GUIDANCE;  Surgeon: [name], MD;  Location: [facility];  Service: General;  Laterality: N/A;  . RADIAL HEAD ARTHROPLASTY Right 06/25/2019   Procedure: RIGHT RADIAL HEAD ARTHROPLASTY;  Surgeon: [name], MD;  Location: [facility];  Service: Orthopedics;  Laterality: Right;  . TUBAL LIGATION       A IV Location/Drains/Wounds Patient Lines/Drains/Airways Status    Active Line/Drains/Airways    Name Placement date Placement time Site Days   Implanted Port 12/19/19 12/19/19  --  --  53   Peripheral IV 12/19/19 12/19/19  --  --  53   Closed System Drain 1 Inferior;Lateral;Left;Medial Breast Bulb (JP) 19 Fr. 12/19/19  1235  Breast  53   Closed System Drain 2 Inferior;Lateral;Left Breast Bulb (JP) 19 Fr. 12/19/19  1235  Breast  53   Incision (Closed) 06/25/19 Arm Right 06/25/19  1141   230   Incision (Closed) 12/14/19 Lip 12/14/19  0952   58   Incision (Closed) 12/19/19 Chest Right 12/19/19  1304   53   Incision (Closed) 12/19/19 Breast Left 12/19/19  1304   53   Pressure Injury 12/19/19 Tibial Distal;Left;Posterior Stage 2 -  Partial thickness loss of dermis presenting as a shallow open injury with a red, pink wound bed without slough. Pink, red 12/19/19  1903   53   Pressure Injury 12/19/19 Sacrum Medial Stage 2 -  Partial thickness loss of dermis presenting as a shallow open injury with a red, pink wound bed without slough. Pink, red 12/19/19   1904   53          Intake/Output Last 24 hours No intake or output data in the 24 hours ending 02/10/20 1412  Labs/Imaging Results for orders placed or performed during the hospital encounter of 02/10/20 (from the past 48 hour(s))  Comprehensive metabolic panel     Status: Abnormal   Collection Time: 02/10/20  8:53 AM  Result Value Ref Range   Sodium 139 135 - 145 mmol/L   Potassium 3.8 3.5 - 5.1 mmol/L   Chloride 103 98 - 111 mmol/L   CO2 23 22 - 32 mmol/L   Glucose, Bld 116 (H) 70 - 99 mg/dL    Comment: Glucose reference range applies only to samples taken after fasting for at least 8 hours.   BUN 24 (H) 8 - 23 mg/dL   Creatinine, Ser 1.21 (H) 0.44 - 1.00 mg/dL   Calcium 7.8 (L) 8.9 - 10.3 mg/dL   Total Protein 7.6 6.5 - 8.1 g/dL   Albumin 3.2 (L) 3.5 - 5.0 g/dL   AST 43 (H) 15 - 41 U/L   ALT 29 0 - 44 U/L   Alkaline Phosphatase 73 38 - 126 U/L   Total Bilirubin 1.0 0.3 - 1.2 mg/dL   GFR calc non Af Amer 47 (L) >60 mL/min   GFR calc Af Amer 55 (L) >60 mL/min   Anion gap 13 5 - 15    Comment: Performed at [facility], [place] [address], [place], [place] [address]  Lactic acid, plasma     Status: None   Collection Time: 02/10/20  8:53 AM  Result Value Ref Range   Lactic Acid, Venous 1.6 0.5 - 1.9 mmol/L    Comment: Performed at [facility], [place] [address], [place], [place] [address]  CBC with Differential     Status: Abnormal   Collection Time: 02/10/20  8:53 AM  Result Value Ref Range   WBC 24.4 (H) 4.0 - 10.5 K/uL   RBC 2.67 (L) 3.87 - 5.11 MIL/uL   Hemoglobin 8.4 (L) 12.0 - 15.0 g/dL   HCT 24.8 (L) 36 - 46 %   MCV 92.9 80.0 - 100.0 fL   MCH 31.5 26.0 - 34.0 pg   MCHC 33.9 30.0 - 36.0 g/dL   RDW 15.1 11.5 - 15.5 %   Platelets 184 150 - 400 K/uL   nRBC 0.0 0.0 - 0.2 %   Neutrophils Relative % 70 %   Neutro Abs 17.2 (H) 1.7 - 7.7 K/uL   Lymphocytes Relative 6 %   Lymphs Abs 1.4 0.7 - 4.0 K/uL   Monocytes Relative 1 %    Monocytes Absolute 0.1 0 - 1 K/uL   Eosinophils Relative 1 %   Eosinophils Absolute 0.2 0 - 0 K/uL   Basophils Relative 0 %   Basophils Absolute 0.1 0 - 0 K/uL   Immature Granulocytes 22 %   Abs Immature Granulocytes 5.37 (H) 0.00 - 0.07 K/uL   Target Cells PRESENT     Comment: Performed at  [facility], [place] [address] [place], [place] [address]  APTT     Status: Abnormal   Collection Time: 02/10/20  9:23 AM  Result Value Ref Range   aPTT 39 (H) 24 - 36 seconds    Comment:        IF BASELINE aPTT IS ELEVATED, SUGGEST PATIENT RISK ASSESSMENT BE USED TO DETERMINE APPROPRIATE ANTICOAGULANT THERAPY. Performed at [facility], [place] [address], [place], [place] [address]   Protime-INR     Status: None   Collection Time: 02/10/20  9:23 AM  Result Value Ref Range   Prothrombin Time 14.4 11.4 - 15.2 seconds   INR 1.2 0.8 - 1.2    Comment: (NOTE) INR goal varies based on device and disease states. Performed at [facility], [place] [address], [place], [place] [address]   Lactic acid, plasma     Status: None   Collection Time: 02/10/20 10:04 AM  Result Value Ref Range   Lactic Acid, Venous 1.3 0.5 - 1.9 mmol/L    Comment: Performed at [facility], [place] [address], [place], [place] [address]   DG Chest Portable 1 View  Result Date: 02/10/2020 CLINICAL DATA:  Breast carcinoma with recent blood transfusion. Concern for potential sepsis EXAM: PORTABLE CHEST 1 VIEW COMPARISON:  December 19, 2019 FINDINGS: Port-A-Cath tip is in the superior vena cava. No pneumothorax. The lungs are clear. The heart is mildly enlarged with pulmonary vascularity normal. No adenopathy. There are surgical clips in the left axillary region. IMPRESSION: Port-A-Cath as described without pneumothorax. Lungs clear. Mild cardiomegaly. No evident adenopathy. Electronically Signed   By: Lowella Grip III M.D.   On: 02/10/2020 09:53   VAS [place] LOWER  EXTREMITY VENOUS (DVT) (ONLY [facility] & [facility])  Result Date: 02/10/2020  Lower Venous DVTStudy Indications: Swelling.  Limitations: Body habitus. Comparison Study: 11/04/18 negative Performing Technologist: June Leap RDMS, RVT  Examination Guidelines: A complete evaluation includes B-mode imaging, spectral Doppler, color Doppler, and power Doppler as needed of all accessible portions of each vessel. Bilateral testing is considered an integral part of a complete examination. Limited examinations for reoccurring indications may be performed as noted. The reflux portion of the exam is performed with the patient in reverse Trendelenburg.  +---------+---------------+---------+-----------+----------+--------------+ LEFT     CompressibilityPhasicitySpontaneityPropertiesThrombus Aging +---------+---------------+---------+-----------+----------+--------------+ CFV      Full           Yes      Yes                                 +---------+---------------+---------+-----------+----------+--------------+ SFJ      Full                                                        +---------+---------------+---------+-----------+----------+--------------+ FV Prox  Full                                                        +---------+---------------+---------+-----------+----------+--------------+ FV Mid   Full                                                        +---------+---------------+---------+-----------+----------+--------------+  FV DistalFull                                                        +---------+---------------+---------+-----------+----------+--------------+ PFV                                                   Not visualized +---------+---------------+---------+-----------+----------+--------------+ POP      Full           Yes      Yes                                 +---------+---------------+---------+-----------+----------+--------------+ PTV      Full                                                         +---------+---------------+---------+-----------+----------+--------------+ PERO                                                  Not visualized +---------+---------------+---------+-----------+----------+--------------+     Summary: LEFT: - There is no evidence of deep vein thrombosis in the lower extremity. However, portions of this examination were limited- see technologist comments above.  *See table(s) above for measurements and observations. Electronically signed by Monica Martinez MD on 02/10/2020 at 11:15:19 AM.    Final     Pending Labs Unresulted Labs (From admission, onward) Comment          Start     Ordered   02/11/20 0500  Comprehensive metabolic panel  Daily,   R      02/10/20 1333   02/11/20 0500  Magnesium  Daily,   R      02/10/20 1333   02/11/20 0500  Phosphorus  Daily,   R      02/10/20 1333   02/11/20 0500  CBC WITH DIFFERENTIAL  Daily,   R      02/10/20 1333   02/10/20 1331  Urine culture  ONCE - STAT,   STAT        02/10/20 1333   02/10/20 1331  Hemoglobin A1c  ONCE - STAT,   STAT        02/10/20 1333   02/10/20 1331  Lipid panel  ONCE - STAT,   STAT        02/10/20 1333   02/10/20 1330  Culture, blood (routine x 2)  BLOOD CULTURE X 2,   R (with STAT occurrences)      02/10/20 1333   02/10/20 1325  CBC  (heparin)  Once,   STAT       Comments: Baseline for heparin therapy IF NOT ALREADY DRAWN.  Notify MD if PLT < 100 K.    02/10/20 1333   02/10/20 1325  Creatinine, serum  (heparin)  Once,   STAT  Comments: Baseline for heparin therapy IF NOT ALREADY DRAWN.    02/10/20 1333   02/10/20 1323  Ethanol  ONCE - STAT,   STAT        02/10/20 1322   02/10/20 1322  Urine rapid drug screen (hosp performed)  ONCE - STAT,   STAT        02/10/20 1322   02/10/20 1157  SARS Coronavirus 2 by RT PCR (hospital order, performed in [place] hospital lab) Nasopharyngeal Nasopharyngeal Swab  (Tier 2 (TAT 2 hrs))   Once,   STAT       Question Answer Comment  Is this test for diagnosis or screening Screening   Symptomatic for COVID-19 as defined by CDC No   Hospitalized for COVID-19 No   Admitted to ICU for COVID-19 No   Previously tested for COVID-19 Yes   Resident in a congregate (group) care setting Unknown   Employed in healthcare setting No   Pregnant No   Has patient completed COVID vaccination(s) (2 doses of Pfizer/Moderna 1 dose of [employer]) Unknown      02/10/20 1157   02/10/20 0923  Urine culture  ONCE - STAT,   STAT        02/10/20 0924   02/10/20 0842  Blood culture (routine x 2)  BLOOD CULTURE X 2,   STAT      02/10/20 0842   02/10/20 0819  Urinalysis, Routine w reflex microscopic  ONCE - STAT,   STAT        02/10/20 0819          Vitals/Pain Today's Vitals   02/10/20 1335 02/10/20 1339 02/10/20 1354 02/10/20 1409  BP:  (!) 246/137    Pulse:   (!) 149   Resp:  20 (!) 25 19  Temp:      TempSrc:      SpO2:   100%   Weight:      Height:      PainSc: 10-Worst pain ever       Isolation Precautions No active isolations  Medications Medications  nitroGLYCERIN 50 mg in dextrose 5 % 250 mL (0.2 mg/mL) infusion (5 mcg/min Intravenous New Bag/Given 02/10/20 1332)  heparin injection 5,000 Units (has no administration in time range)  acetaminophen (TYLENOL) tablet 650 mg (has no administration in time range)    Or  acetaminophen (TYLENOL) suppository 650 mg (has no administration in time range)  oxyCODONE (Oxy IR/ROXICODONE) immediate release tablet 5 mg (has no administration in time range)  sorbitol 70 % solution 30 mL (has no administration in time range)  aspirin EC tablet 81 mg (has no administration in time range)  vancomycin (VANCOCIN) IVPB 1000 mg/200 mL premix (has no administration in time range)  piperacillin-tazobactam (ZOSYN) IVPB 3.375 g (has no administration in time range)  HYDROmorphone (DILAUDID) injection 1 mg (1 mg Intravenous Given 02/10/20 0846)   cefTRIAXone (ROCEPHIN) 2 g in sodium chloride 0.9 % 100 mL IVPB (0 g Intravenous Stopped 02/10/20 1033)  vancomycin (VANCOREADY) IVPB 2000 mg/400 mL (0 mg Intravenous Stopped 02/10/20 1401)  HYDROmorphone (DILAUDID) injection 1 mg (1 mg Intravenous Given 02/10/20 1019)  sodium chloride 0.9 % bolus 1,000 mL (0 mLs Intravenous Stopped 02/10/20 1210)    Mobility non-ambulatory Moderate fall risk   Focused Assessments .   R Recommendations: See Admitting Provider Note  Report given to:   Additional Notes: Monitor blood pressure .

## 2020-02-10 NOTE — ED Notes (Signed)
Provider aware of being unable to obtain second set of culture.

## 2020-02-10 NOTE — ED Triage Notes (Addendum)
Patient coming from home with c/o left ankle swelling and hot to touch. No deformity. Patient denies fall/injury to the left ankle. Breast cancer patient last chemo was Tue and blood transfusion on Thurs.

## 2020-02-10 NOTE — ED Notes (Signed)
Pt unable to void; purewick placed. Will continue to monitor.

## 2020-02-11 ENCOUNTER — Inpatient Hospital Stay (HOSPITAL_COMMUNITY): Payer: Medicaid Other

## 2020-02-11 ENCOUNTER — Ambulatory Visit: Payer: Medicaid Other | Admitting: Family Medicine

## 2020-02-11 DIAGNOSIS — I161 Hypertensive emergency: Secondary | ICD-10-CM

## 2020-02-11 LAB — ECHOCARDIOGRAM COMPLETE
Height: 61 in
Weight: 4912 oz

## 2020-02-11 LAB — URINALYSIS, ROUTINE W REFLEX MICROSCOPIC
Bacteria, UA: NONE SEEN
Bilirubin Urine: NEGATIVE
Glucose, UA: NEGATIVE mg/dL
Ketones, ur: NEGATIVE mg/dL
Leukocytes,Ua: NEGATIVE
Nitrite: NEGATIVE
Protein, ur: NEGATIVE mg/dL
Specific Gravity, Urine: 1.012 (ref 1.005–1.030)
pH: 5 (ref 5.0–8.0)

## 2020-02-11 LAB — COMPREHENSIVE METABOLIC PANEL
ALT: 22 U/L (ref 0–44)
AST: 22 U/L (ref 15–41)
Albumin: 2.8 g/dL — ABNORMAL LOW (ref 3.5–5.0)
Alkaline Phosphatase: 60 U/L (ref 38–126)
Anion gap: 12 (ref 5–15)
BUN: 28 mg/dL — ABNORMAL HIGH (ref 8–23)
CO2: 22 mmol/L (ref 22–32)
Calcium: 7.4 mg/dL — ABNORMAL LOW (ref 8.9–10.3)
Chloride: 104 mmol/L (ref 98–111)
Creatinine, Ser: 1.24 mg/dL — ABNORMAL HIGH (ref 0.44–1.00)
GFR calc Af Amer: 53 mL/min — ABNORMAL LOW (ref >60)
GFR calc non Af Amer: 46 mL/min — ABNORMAL LOW (ref >60)
Glucose, Bld: 121 mg/dL — ABNORMAL HIGH (ref 70–99)
Potassium: 4 mmol/L (ref 3.5–5.1)
Sodium: 138 mmol/L (ref 135–145)
Total Bilirubin: 1.4 mg/dL — ABNORMAL HIGH (ref 0.3–1.2)
Total Protein: 7.1 g/dL (ref 6.5–8.1)

## 2020-02-11 LAB — MAGNESIUM: Magnesium: 1.1 mg/dL — ABNORMAL LOW (ref 1.7–2.4)

## 2020-02-11 LAB — URINE CULTURE: Culture: NO GROWTH

## 2020-02-11 LAB — CBC WITH DIFFERENTIAL/PLATELET
Abs Immature Granulocytes: 0 10*3/uL (ref 0.00–0.07)
Basophils Absolute: 0 10*3/uL (ref 0.0–0.1)
Basophils Relative: 0 %
Eosinophils Absolute: 0.2 10*3/uL (ref 0.0–0.5)
Eosinophils Relative: 2 %
HCT: 24.1 % — ABNORMAL LOW (ref 36.0–46.0)
Hemoglobin: 7.7 g/dL — ABNORMAL LOW (ref 12.0–15.0)
Lymphocytes Relative: 14 %
Lymphs Abs: 1.6 10*3/uL (ref 0.7–4.0)
MCH: 30.8 pg (ref 26.0–34.0)
MCHC: 32 g/dL (ref 30.0–36.0)
MCV: 96.4 fL (ref 80.0–100.0)
Monocytes Absolute: 0.2 10*3/uL (ref 0.1–1.0)
Monocytes Relative: 2 %
Neutro Abs: 9.5 10*3/uL — ABNORMAL HIGH (ref 1.7–7.7)
Neutrophils Relative %: 82 %
Platelets: 138 10*3/uL — ABNORMAL LOW (ref 150–400)
RBC: 2.5 MIL/uL — ABNORMAL LOW (ref 3.87–5.11)
RDW: 15.2 % (ref 11.5–15.5)
WBC: 11.6 10*3/uL — ABNORMAL HIGH (ref 4.0–10.5)
nRBC: 0 % (ref 0.0–0.2)

## 2020-02-11 LAB — URIC ACID: Uric Acid, Serum: 9.1 mg/dL — ABNORMAL HIGH (ref 2.5–7.1)

## 2020-02-11 LAB — PHOSPHORUS: Phosphorus: 2.9 mg/dL (ref 2.5–4.6)

## 2020-02-11 IMAGING — DX DG FOOT 2V*L*
2 series · 2 of 2 positions shown · non-contrast
Comparison: [DATE]

CLINICAL DATA: Soft tissue swelling and warmth

EXAM:
LEFT FOOT - 2 VIEW

[foot ap]
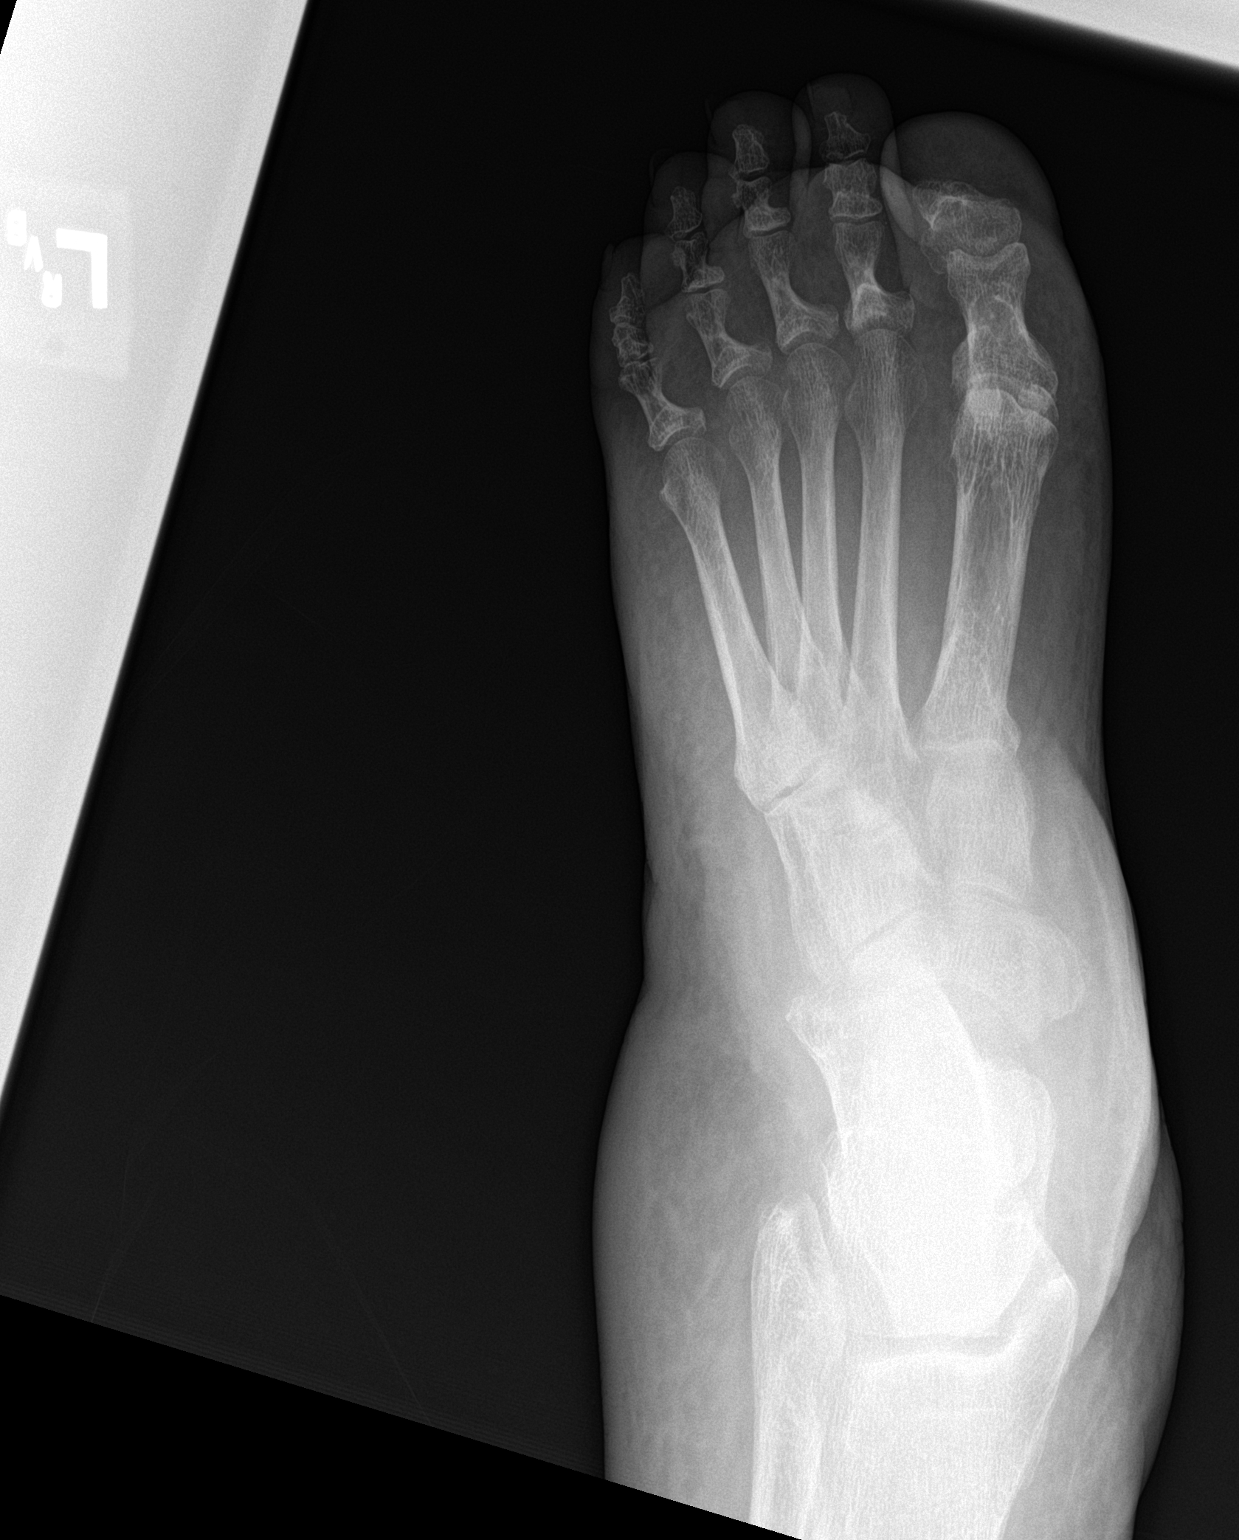

[foot lat]
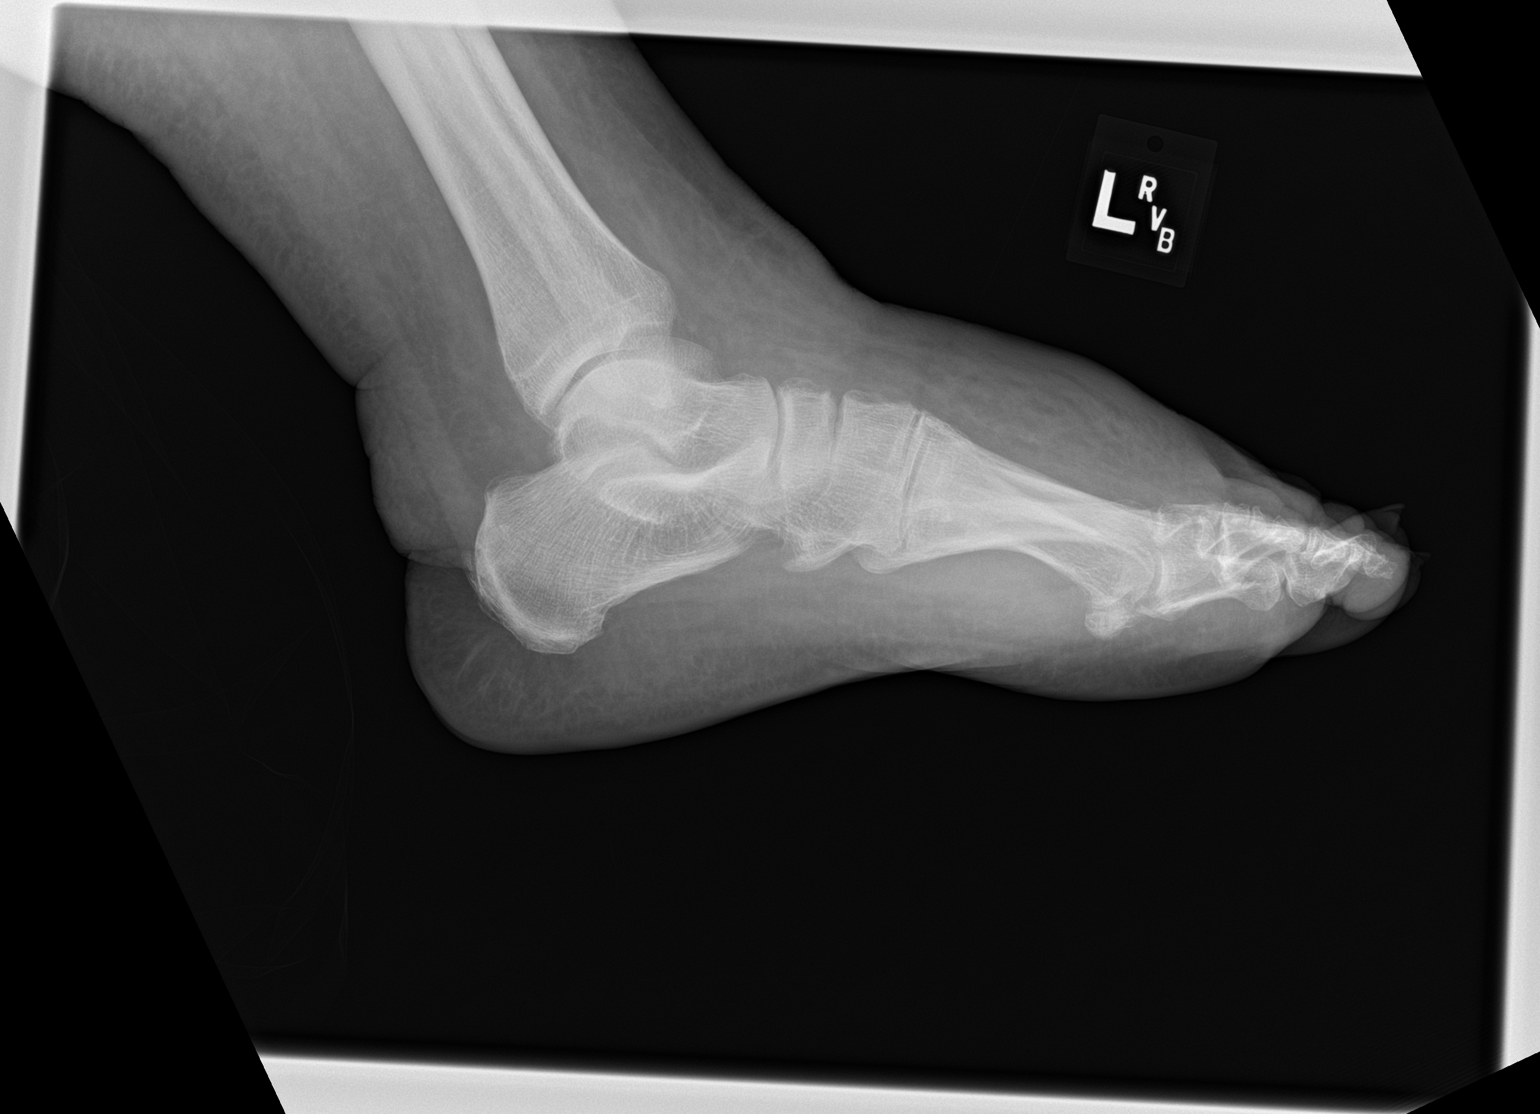

[2 of 2 positions shown; findings below may reference images not displayed]

FINDINGS: Frontal and lateral views were obtained. There is marked soft tissue
swelling. No evident fracture or dislocation. There is degenerative
change with narrowing of the first MTP joint. Other joint spaces
appear unremarkable. There is a posterior calcaneal spur. No erosive
change or bony destruction. Bones appear somewhat osteoporotic.
IMPRESSION: Marked soft tissue swelling. No bony destruction or erosion.
Narrowing first MTP joint. Other joint spaces appear unremarkable.
There is a posterior calcaneal spur.

## 2020-02-11 IMAGING — DX DG ANKLE 2V *L*
2 series · 2 of 2 positions shown · non-contrast
Comparison: [DATE]

CLINICAL DATA: Swelling and warmth

EXAM:
LEFT ANKLE: 2 VIEW

[ankle ap]
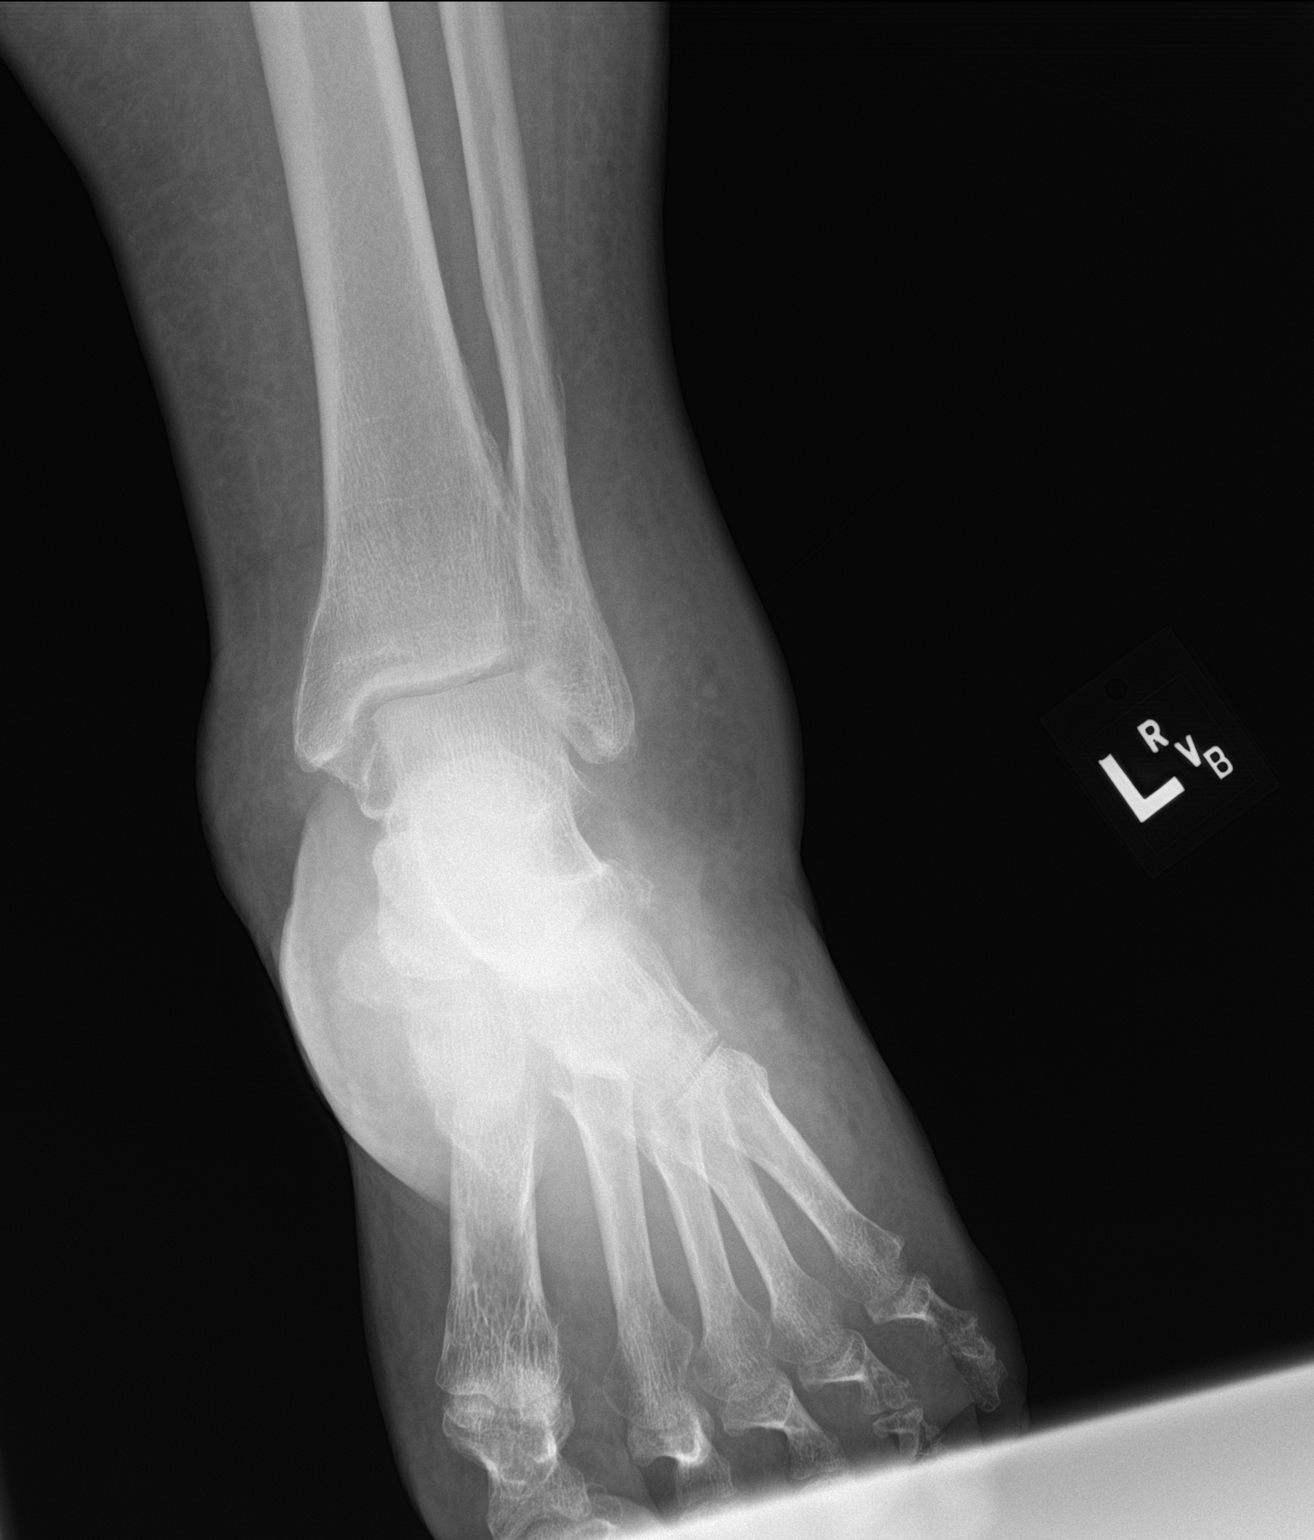

[ankle lat]
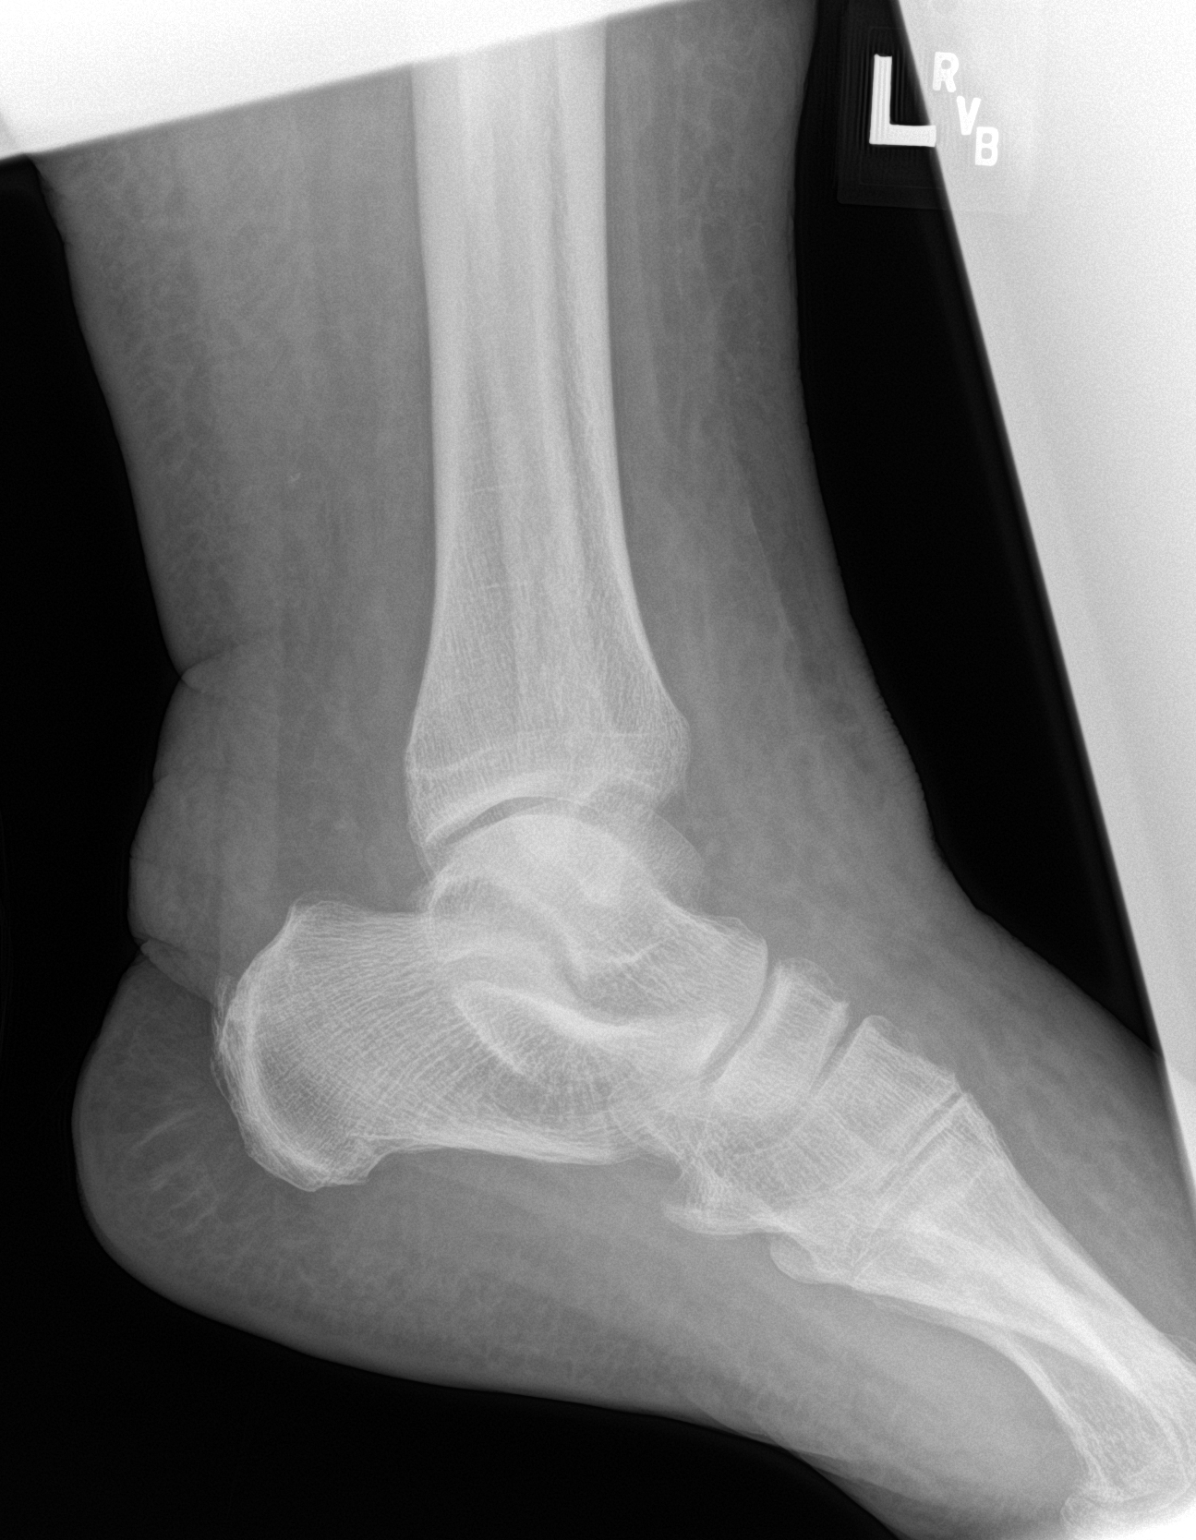

[2 of 2 positions shown; findings below may reference images not displayed]

FINDINGS: Frontal and lateral views were obtained. There is marked soft tissue
swelling. No fracture or joint effusion. No appreciable joint space
narrowing. There is a posterior calcaneal spur. No erosive change or
bony destruction.
IMPRESSION: Diffuse soft tissue swelling. No fracture or bony destruction. No
erosion. No appreciable joint space narrowing. There is a posterior
calcaneal spur.

## 2020-02-11 MED ORDER — HYDROMORPHONE HCL 1 MG/ML IJ SOLN
1.0000 mg | Freq: Once | INTRAMUSCULAR | Status: AC
  Administered 2020-02-11: 1 mg via INTRAVENOUS
  Filled 2020-02-11: qty 1

## 2020-02-11 MED ORDER — METHYLPREDNISOLONE SODIUM SUCC 125 MG IJ SOLR
60.0000 mg | Freq: Once | INTRAMUSCULAR | Status: AC
  Administered 2020-02-11: 60 mg via INTRAVENOUS
  Filled 2020-02-11: qty 2

## 2020-02-11 MED ORDER — COLCHICINE 0.6 MG PO TABS
0.6000 mg | ORAL_TABLET | Freq: Every day | ORAL | Status: DC
  Administered 2020-02-11 – 2020-02-12 (×2): 0.6 mg via ORAL
  Filled 2020-02-11 (×2): qty 1

## 2020-02-11 MED ORDER — PERFLUTREN LIPID MICROSPHERE
1.0000 mL | INTRAVENOUS | Status: AC | PRN
  Administered 2020-02-11: 3 mL via INTRAVENOUS
  Filled 2020-02-11: qty 10

## 2020-02-11 MED ORDER — METOPROLOL TARTRATE 25 MG PO TABS
50.0000 mg | ORAL_TABLET | Freq: Two times a day (BID) | ORAL | Status: DC
  Administered 2020-02-11 – 2020-02-12 (×3): 50 mg via ORAL
  Filled 2020-02-11 (×3): qty 2

## 2020-02-11 MED ORDER — HYDRALAZINE HCL 25 MG PO TABS
25.0000 mg | ORAL_TABLET | Freq: Three times a day (TID) | ORAL | Status: DC
  Administered 2020-02-11 – 2020-02-14 (×10): 25 mg via ORAL
  Filled 2020-02-11 (×10): qty 1

## 2020-02-11 MED ORDER — OXYCODONE HCL 5 MG PO TABS: 15.0000 mg | ORAL_TABLET | ORAL | Status: DC | PRN

## 2020-02-11 MED ORDER — MAGNESIUM SULFATE 2 GM/50ML IV SOLN
2.0000 g | Freq: Once | INTRAVENOUS | Status: AC
  Administered 2020-02-11: 2 g via INTRAVENOUS
  Filled 2020-02-11: qty 50

## 2020-02-11 NOTE — Progress Notes (Signed)
PROGRESS NOTE  [name]  EXB:[id number] DOB: 1956-04-06 DOA: 02/10/2020 PCP: [name], MD  Outpatient Specialists: Oncology, Dr. Jana Hakim Brief Narrative: [name] is a 64 y.o. female with a history of left breast CA Dx April 2021 s/p mastectomy and undergoing chemotherapy, lymphedema, poor wound healing, venous insufficiency, stage III CKD with previous acute renal failure, EtOH use who presented to the [facility] 6/13 with left leg/foot swelling and pain beginning in the ankle and extending proximally over the course of 1 day. Exam was concerning for cellulitis versus DVT. U/S was negative for DVT. WBC 24.4k, SCr near baseline at 1.21, hgb near baseline at 8.4g/dl. Vancomycin and ceftriaxone were given for cellulitis. Code sepsis was called due to severe tachycardia, leukocytosis. BP was elevated in the setting of being taken of antihypertensives and nitroglycerin infusion was started, patient admitted to SDU.  Assessment & Plan: Principal Problem:   Cellulitis of left lower extremity without foot Active Problems:   Essential hypertension   Alcohol-induced mood disorder (HCC)   Alcohol dependence (HCC)   Alcoholic hepatitis   Malignant neoplasm of upper-outer quadrant of left breast in female, estrogen receptor positive ([place])   Morbid obesity with BMI of 60.0-69.9, adult (HCC)   S/P left mastectomy   Hypertensive emergency   Cellulitis of left lower extremity  LLE cellulitis: Complicated by lymphedema, obesity, venous insufficiency. Pt is not diabetic. No DVT on venous U/S. Wound care note recently stated ABI was 1.07. No evidence of wound that is reported to have healed 6/4 on exam. - Continue broad antibiotics by IV given sepsis in immunocompromised patient - XR not showing erosive changes.  - Monitor blood cultures (NG <24hrs at this time). Note history of Enterococcus on Cx thought to have been contaminant. - In setting of chemotherapy and pain on the upper limit of what would  be expected for uncomplicated cellulitis, ?gout, check uric acid. this was elevated, will empirically treat with colchicine and therapeutic trial dose of solumedrol. No definite drainable joint on palpation. - Pain control: Increase dose of oxycodone prn and give one time repeat dose of IV dilaudid now.  HTN urgency:  - Titrate up on beta blocker with concomitant tachycardia. Will add hydralazine. Will avoid diuretics and RAAS agents for now. DC NTG gtt. - Echo with G1DD, LVEF 70%, IVC appeared normal.  Left breast cancer s/p mastectomy now on adjuvant chemo (cyclophosphamide and doxorubicin), last dose 02/07/2020.  - Added Dr. Jana Hakim to Tx team.   Anemia: Likely related to chronic disease/malignancy and chemotherapy. As preDx baseline was wnl. - Transfusion threshold likely 7g/dl.   Stage IIIa CKD:  - SCr at baseline, will avoid nephrotoxins. Will not restart ARB at this time.   Marijuana use: +THC on UDS. Hx EtOH abuse per report, though not currently and negative level at admission. - Cessation counseling provided.   Anxiety:  - Continue prn  Hypomagnesemia:  - Supplement and monitor.   Obesity: Estimated body mass index is 58.01 kg/m as calculated from the following:   Height as of this encounter: 5\' 1"  (1.549 m).   Weight as of this encounter: 139.3 kg.  DVT prophylaxis: Heparin Code Status: DNR Family Communication: None at bedside Disposition Plan:  Status is: Inpatient  Remains inpatient appropriate because:Hemodynamically unstable and IV treatments appropriate due to intensity of illness or inability to take PO   Dispo: The patient is from: Home              Anticipated d/c is to: TBD  Anticipated d/c date is: 3 days              Patient currently is not medically stable to d/c.  Consultants:   None  Procedures:   None  Antimicrobials:  Vancomycin, zosyn 6/13 >>    Subjective: Pain in left foot is severe, constant, only minimally improved  with IV dilaudid and po oxycodone 10mg . No fever.   Objective: Vitals:   02/11/20 1130 02/11/20 1145 02/11/20 1200 02/11/20 1215  BP: 136/66 126/67 (!) 148/87 (!) 151/82  Pulse:      Resp: 15 (!) 22 (!) 22 20  Temp:   99.8 F (37.7 C)   TempSrc:   Oral   SpO2: 100% 100% 100% 100%  Weight:      Height:        Intake/Output Summary (Last 24 hours) at 02/11/2020 1247 Last data filed at 02/11/2020 1200 Gross per 24 hour  Intake 1087.58 ml  Output 400 ml  Net 687.58 ml   Filed Weights   02/10/20 0751  Weight: (!) 139.3 kg    Gen: Pleasant, tired-appearing female in no distress Pulm: Non-labored breathing room air. Clear to auscultation bilaterally.  CV: Regular borderline tachycardia. No murmur, rub, or gallop. UTD JVD GI: Abdomen soft, protuberant, non-tender, non-distended, with normoactive bowel sounds. No organomegaly or masses felt. Ext: Warm, no deformities, RLE without pitting edema.  Skin: Bilateral LE's with irregular hyperpigmentation in lower legs, no hair growth. LLE without evidence of wound, though diffuse severe pitting tender edema on dorsum of foot extending to ankle and somewhat proximal to that joint. Tenderness with movement of the ankle and toes. No focal erythema overlaying a joint, but diffusely erythematous. No fluctuance. Neuro: Alert and oriented. No focal neurological deficits. Psych: Judgement and insight appear normal. Mood & affect appropriate.   Data Reviewed: I have personally reviewed following labs and imaging studies  CBC: Recent Labs  Lab 02/05/20 0901 02/10/20 0853 02/10/20 1536 02/11/20 0340  WBC 7.0 24.4* 22.9* 11.6*  NEUTROABS 2.1 17.2*  --  9.5*  HGB 7.5* 8.4* 8.5* 7.7*  HCT 22.6* 24.8* 25.9* 24.1*  MCV 93.8 92.9 95.6 96.4  PLT 235 184 175 614*   Basic Metabolic Panel: Recent Labs  Lab 02/05/20 0901 02/10/20 0853 02/10/20 1536 02/11/20 0340  NA 137 139  --  138  K 3.2* 3.8  --  4.0  CL 102 103  --  104  CO2 19* 23  --   22  GLUCOSE 97 116*  --  121*  BUN 14 24*  --  28*  CREATININE 1.54* 1.21* 1.28* 1.24*  CALCIUM 7.3* 7.8*  --  7.4*  MG  --   --   --  1.1*  PHOS  --   --   --  2.9   GFR: Estimated Creatinine Clearance: 61.1 mL/min (A) (by C-G formula based on SCr of 1.24 mg/dL (H)). Liver Function Tests: Recent Labs  Lab 02/05/20 0901 02/10/20 0853 02/11/20 0340  AST 35 43* 22  ALT 22 29 22   ALKPHOS 59 73 60  BILITOT 0.4 1.0 1.4*  PROT 7.3 7.6 7.1  ALBUMIN 2.5* 3.2* 2.8*   No results for input(s): LIPASE, AMYLASE in the last 168 hours. No results for input(s): AMMONIA in the last 168 hours. Coagulation Profile: Recent Labs  Lab 02/10/20 0923  INR 1.2   Cardiac Enzymes: No results for input(s): CKTOTAL, CKMB, CKMBINDEX, TROPONINI in the last 168 hours. BNP (last 3 results) No results for  input(s): PROBNP in the last 8760 hours. HbA1C: Recent Labs    02/10/20 1536  HGBA1C 5.7*   CBG: No results for input(s): GLUCAP in the last 168 hours. Lipid Profile: Recent Labs    02/10/20 1536  CHOL 216*  HDL 100  LDLCALC 96  TRIG 102  CHOLHDL 2.2   Thyroid Function Tests: No results for input(s): TSH, T4TOTAL, FREET4, T3FREE, THYROIDAB in the last 72 hours. Anemia Panel: No results for input(s): VITAMINB12, FOLATE, FERRITIN, TIBC, IRON, RETICCTPCT in the last 72 hours. Urine analysis:    Component Value Date/Time   COLORURINE YELLOW 01/22/2020 1601   APPEARANCEUR HAZY (A) 01/22/2020 1601   LABSPEC 1.009 01/22/2020 1601   PHURINE 5.0 01/22/2020 1601   GLUCOSEU NEGATIVE 01/22/2020 1601   HGBUR NEGATIVE 01/22/2020 1601   BILIRUBINUR NEGATIVE 01/22/2020 1601   KETONESUR NEGATIVE 01/22/2020 1601   PROTEINUR NEGATIVE 01/22/2020 1601   UROBILINOGEN 1.0 07/14/2012 1630   NITRITE NEGATIVE 01/22/2020 1601   LEUKOCYTESUR SMALL (A) 01/22/2020 1601   Recent Results (from the past 240 hour(s))  Blood culture (routine x 2)     Status: None (Preliminary result)   Collection Time:  02/10/20  8:53 AM   Specimen: BLOOD  Result Value Ref Range Status   Specimen Description   Final    BLOOD Performed at [facility], [place] [address], [place], [place] [address]    Special Requests   Final    BOTTLES DRAWN AEROBIC AND ANAEROBIC Blood Culture adequate volume Performed at [place] [address] [place], [place] [address]    Culture   Final    NO GROWTH < 24 HOURS Performed at [place] [address], [place], [place] [address]    Report Status PENDING  Incomplete  SARS Coronavirus 2 by RT PCR (hospital order, performed in [facility] hospital lab) Nasopharyngeal Nasopharyngeal Swab     Status: None   Collection Time: 02/10/20 11:57 AM   Specimen: Nasopharyngeal Swab  Result Value Ref Range Status   SARS Coronavirus 2 NEGATIVE NEGATIVE Final    Comment: (NOTE) SARS-CoV-2 target nucleic acids are NOT DETECTED.  The SARS-CoV-2 RNA is generally detectable in upper and lower respiratory specimens during the acute phase of infection. The lowest concentration of SARS-CoV-2 viral copies this assay can detect is 250 copies / mL. A negative result does not preclude SARS-CoV-2 infection and should not be used as the sole basis for treatment or other patient management decisions.  A negative result may occur with improper specimen collection / handling, submission of specimen other than nasopharyngeal swab, presence of viral mutation(s) within the areas targeted by this assay, and inadequate number of viral copies (<250 copies / mL). A negative result must be combined with clinical observations, patient history, and epidemiological information.  Fact Sheet for Patients:   StrictlyIdeas.no  Fact Sheet for Healthcare Providers: BankingDealers.co.za  This test is not yet approved or  cleared by the [place] FDA and has been authorized for detection and/or diagnosis of  SARS-CoV-2 by FDA under an Emergency Use Authorization (EUA).  This EUA will remain in effect (meaning this test can be used) for the duration of the COVID-19 declaration under Section 564(b)(1) of the Act, 21 U.S.C. section 360bbb-3(b)(1), unless the authorization is terminated or revoked sooner.  Performed at [facility], [place] [address] [place], [place] [address]   Blood culture (routine x 2)     Status: None (Preliminary result)  Collection Time: 02/10/20  3:36 PM   Specimen: BLOOD  Result Value Ref Range Status   Specimen Description   Final    BLOOD RIGHT ANTECUBITAL Performed at [place] [address], [place], [place] [address]    Special Requests   Final    BOTTLES DRAWN AEROBIC AND ANAEROBIC Blood Culture results may not be optimal due to an inadequate volume of blood received in culture bottles Performed at [place] [address], [place], [place] [address]    Culture   Final    NO GROWTH < 24 HOURS Performed at [place] [address], [place], [place] [address]    Report Status PENDING  Incomplete      Radiology Studies: DG Ankle 2 Views Left  Result Date: 02/11/2020 CLINICAL DATA:  Swelling and warmth EXAM: LEFT ANKLE: 2 VIEW COMPARISON:  November 04, 2018 FINDINGS: Frontal and lateral views were obtained. There is marked soft tissue swelling. No fracture or joint effusion. No appreciable joint space narrowing. There is a posterior calcaneal spur. No erosive change or bony destruction. IMPRESSION: Diffuse soft tissue swelling. No fracture or bony destruction. No erosion. No appreciable joint space narrowing. There is a posterior calcaneal spur. Electronically Signed   By: Lowella Grip III M.D.   On: 02/11/2020 10:22   DG Chest Portable 1 View  Result Date: 02/10/2020 CLINICAL DATA:  Breast carcinoma with recent blood transfusion. Concern for potential sepsis EXAM: PORTABLE CHEST 1 VIEW  COMPARISON:  December 19, 2019 FINDINGS: Port-A-Cath tip is in the superior vena cava. No pneumothorax. The lungs are clear. The heart is mildly enlarged with pulmonary vascularity normal. No adenopathy. There are surgical clips in the left axillary region. IMPRESSION: Port-A-Cath as described without pneumothorax. Lungs clear. Mild cardiomegaly. No evident adenopathy. Electronically Signed   By: Lowella Grip III M.D.   On: 02/10/2020 09:53   DG Foot 2 Views Left  Result Date: 02/11/2020 CLINICAL DATA:  Soft tissue swelling and warmth EXAM: LEFT FOOT - 2 VIEW COMPARISON:  November 04, 2018 FINDINGS: Frontal and lateral views were obtained. There is marked soft tissue swelling. No evident fracture or dislocation. There is degenerative change with narrowing of the first MTP joint. Other joint spaces appear unremarkable. There is a posterior calcaneal spur. No erosive change or bony destruction. Bones appear somewhat osteoporotic. IMPRESSION: Marked soft tissue swelling. No bony destruction or erosion. Narrowing first MTP joint. Other joint spaces appear unremarkable. There is a posterior calcaneal spur. Electronically Signed   By: Lowella Grip III M.D.   On: 02/11/2020 10:18   ECHOCARDIOGRAM COMPLETE  Result Date: 02/11/2020    ECHOCARDIOGRAM REPORT   Patient Name:   NIYANA CHESBRO Date of Exam: 02/11/2020 Medical Rec #:  [id number]     Height:       61.0 in Accession #:    [id number]    Weight:       307.0 lb Date of Birth:  12-08-1955    BSA:          2.266 m Patient Age:    51 years      BP:           164/71 mmHg Patient Gender: F             HR:           123 bpm. Exam Location:  Inpatient Procedure: 2D Echo, Cardiac Doppler, Color Doppler and Intracardiac  Opacification Agent Indications:    Hypertensive emergency  History:        Patient has prior history of Echocardiogram examinations, most                 recent 01/22/2020. Risk Factors:Hypertension and Non-Smoker.                 Breast  cancer.  Sonographer:    Vickie Epley RDCS Referring Phys: [id number] [place]  1. Left ventricular ejection fraction, by estimation, is 70 to 75%. The left ventricle has hyperdynamic function. The left ventricle has no regional wall motion abnormalities. Left ventricular diastolic parameters are consistent with Grade I diastolic dysfunction (impaired relaxation). Elevated left ventricular end-diastolic pressure.  2. Right ventricular systolic function is normal. The right ventricular size is normal. Tricuspid regurgitation signal is inadequate for assessing PA pressure.  3. The mitral valve is normal in structure. No evidence of mitral valve regurgitation. No evidence of mitral stenosis.  4. The aortic valve is normal in structure. Aortic valve regurgitation is not visualized. No aortic stenosis is present.  5. The inferior vena cava is normal in size with greater than 50% respiratory variability, suggesting right atrial pressure of 3 mmHg. FINDINGS  Left Ventricle: Left ventricular ejection fraction, by estimation, is 70 to 75%. The left ventricle has hyperdynamic function. The left ventricle has no regional wall motion abnormalities. Definity contrast agent was given IV to delineate the left ventricular endocardial borders. The left ventricular internal cavity size was normal in size. There is no left ventricular hypertrophy. Left ventricular diastolic parameters are consistent with Grade I diastolic dysfunction (impaired relaxation). Elevated left ventricular end-diastolic pressure. Right Ventricle: The right ventricular size is normal. No increase in right ventricular wall thickness. Right ventricular systolic function is normal. Tricuspid regurgitation signal is inadequate for assessing PA pressure. Left Atrium: Left atrial size was normal in size. Right Atrium: Right atrial size was normal in size. Pericardium: There is no evidence of pericardial effusion. Mitral Valve: The mitral valve is  normal in structure. Normal mobility of the mitral valve leaflets. No evidence of mitral valve regurgitation. No evidence of mitral valve stenosis. Tricuspid Valve: The tricuspid valve is normal in structure. Tricuspid valve regurgitation is trivial. No evidence of tricuspid stenosis. Aortic Valve: The aortic valve is normal in structure. Aortic valve regurgitation is not visualized. No aortic stenosis is present. Pulmonic Valve: The pulmonic valve was normal in structure. Pulmonic valve regurgitation is not visualized. No evidence of pulmonic stenosis. Aorta: The aortic root is normal in size and structure. Venous: The inferior vena cava is normal in size with greater than 50% respiratory variability, suggesting right atrial pressure of 3 mmHg. IAS/Shunts: No atrial level shunt detected by color flow Doppler.  LEFT VENTRICLE PLAX 2D LVIDd:         3.80 cm  Diastology LVIDs:         2.30 cm  LV e' lateral:   8.49 cm/s LV PW:         0.80 cm  LV E/e' lateral: 13.9 LV IVS:        0.80 cm  LV e' medial:    7.18 cm/s LVOT diam:     1.70 cm  LV E/e' medial:  16.4 LV SV:         51 LV SV Index:   23 LVOT Area:     2.27 cm  RIGHT VENTRICLE RV S prime:     16.40 cm/s TAPSE (  M-mode): 1.9 cm LEFT ATRIUM             Index       RIGHT ATRIUM           Index LA diam:        3.30 cm 1.46 cm/m  RA Area:     12.60 cm LA Vol (A2C):   39.3 ml 17.34 ml/m RA Volume:   27.30 ml  12.05 ml/m LA Vol (A4C):   38.0 ml 16.77 ml/m LA Biplane Vol: 41.6 ml 18.36 ml/m  AORTIC VALVE LVOT Vmax:   170.00 cm/s LVOT Vmean:  121.000 cm/s LVOT VTI:    0.225 m  AORTA Ao Root diam: 2.60 cm MITRAL VALVE MV Area (PHT): 5.75 cm     SHUNTS MV Decel Time: 132 msec     Systemic VTI:  0.22 m MV E velocity: 118.00 cm/s  Systemic Diam: 1.70 cm MV A velocity: 159.00 cm/s MV E/A ratio:  0.74 Fransico Him MD Electronically signed by Fransico Him MD Signature Date/Time: 02/11/2020/9:20:01 AM    Final    VAS [place] LOWER EXTREMITY VENOUS (DVT) (ONLY [facility] &  [facility])  Result Date: 02/10/2020  Lower Venous DVTStudy Indications: Swelling.  Limitations: Body habitus. Comparison Study: 11/04/18 negative Performing Technologist: June Leap RDMS, RVT  Examination Guidelines: A complete evaluation includes B-mode imaging, spectral Doppler, color Doppler, and power Doppler as needed of all accessible portions of each vessel. Bilateral testing is considered an integral part of a complete examination. Limited examinations for reoccurring indications may be performed as noted. The reflux portion of the exam is performed with the patient in reverse Trendelenburg.  +---------+---------------+---------+-----------+----------+--------------+ LEFT     CompressibilityPhasicitySpontaneityPropertiesThrombus Aging +---------+---------------+---------+-----------+----------+--------------+ CFV      Full           Yes      Yes                                 +---------+---------------+---------+-----------+----------+--------------+ SFJ      Full                                                        +---------+---------------+---------+-----------+----------+--------------+ FV Prox  Full                                                        +---------+---------------+---------+-----------+----------+--------------+ FV Mid   Full                                                        +---------+---------------+---------+-----------+----------+--------------+ FV DistalFull                                                        +---------+---------------+---------+-----------+----------+--------------+ PFV  Not visualized +---------+---------------+---------+-----------+----------+--------------+ POP      Full           Yes      Yes                                 +---------+---------------+---------+-----------+----------+--------------+ PTV      Full                                                         +---------+---------------+---------+-----------+----------+--------------+ PERO                                                  Not visualized +---------+---------------+---------+-----------+----------+--------------+     Summary: LEFT: - There is no evidence of deep vein thrombosis in the lower extremity. However, portions of this examination were limited- see technologist comments above.  *See table(s) above for measurements and observations. Electronically signed by Monica Martinez MD on 02/10/2020 at 11:15:19 AM.    Final     Scheduled Meds: . aspirin EC  81 mg Oral Daily  . Chlorhexidine Gluconate Cloth  6 each Topical Daily  . heparin  5,000 Units Subcutaneous Q8H  .  HYDROmorphone (DILAUDID) injection  1 mg Intravenous Once  . mouth rinse  15 mL Mouth Rinse BID  . metoprolol tartrate  50 mg Oral BID  . sodium chloride flush  10-40 mL Intracatheter Q12H   Continuous Infusions: . nitroGLYCERIN 25 mcg/min (02/11/20 1200)  . piperacillin-tazobactam (ZOSYN)  IV 12.5 mL/hr at 02/11/20 1200  . vancomycin Stopped (02/11/20 1029)     LOS: 1 day   Time spent: 35 minutes.  Patrecia Pour, MD [facility] [email or web address] 02/11/2020, 12:47 PM

## 2020-02-11 NOTE — Telephone Encounter (Signed)
Can we assist patient with obtaining BP cuff? Forward to Opal Sidles if needed.   Thanks!  Phill Myron, D.O. [facility] at [facility]  02/11/2020, 9:33 AM

## 2020-02-11 NOTE — Progress Notes (Signed)
°  Echocardiogram 2D Echocardiogram has been performed.  [name] 02/11/2020, 9:03 AM

## 2020-02-11 NOTE — Telephone Encounter (Signed)
Can you assist with this ? 

## 2020-02-12 ENCOUNTER — Inpatient Hospital Stay: Payer: Medicaid Other

## 2020-02-12 ENCOUNTER — Encounter: Payer: Self-pay | Admitting: *Deleted

## 2020-02-12 ENCOUNTER — Inpatient Hospital Stay: Payer: Medicaid Other | Admitting: Adult Health

## 2020-02-12 LAB — COMPREHENSIVE METABOLIC PANEL
ALT: 20 U/L (ref 0–44)
AST: 19 U/L (ref 15–41)
Albumin: 2.6 g/dL — ABNORMAL LOW (ref 3.5–5.0)
Alkaline Phosphatase: 59 U/L (ref 38–126)
Anion gap: 11 (ref 5–15)
BUN: 31 mg/dL — ABNORMAL HIGH (ref 8–23)
CO2: 21 mmol/L — ABNORMAL LOW (ref 22–32)
Calcium: 7.7 mg/dL — ABNORMAL LOW (ref 8.9–10.3)
Chloride: 105 mmol/L (ref 98–111)
Creatinine, Ser: 1.34 mg/dL — ABNORMAL HIGH (ref 0.44–1.00)
GFR calc Af Amer: 48 mL/min — ABNORMAL LOW (ref >60)
GFR calc non Af Amer: 42 mL/min — ABNORMAL LOW (ref >60)
Glucose, Bld: 147 mg/dL — ABNORMAL HIGH (ref 70–99)
Potassium: 3.9 mmol/L (ref 3.5–5.1)
Sodium: 137 mmol/L (ref 135–145)
Total Bilirubin: 1.1 mg/dL (ref 0.3–1.2)
Total Protein: 7.1 g/dL (ref 6.5–8.1)

## 2020-02-12 LAB — CBC WITH DIFFERENTIAL/PLATELET
Abs Immature Granulocytes: 0.39 10*3/uL — ABNORMAL HIGH (ref 0.00–0.07)
Basophils Absolute: 0 10*3/uL (ref 0.0–0.1)
Basophils Relative: 1 %
Eosinophils Absolute: 0 10*3/uL (ref 0.0–0.5)
Eosinophils Relative: 0 %
HCT: 22.7 % — ABNORMAL LOW (ref 36.0–46.0)
Hemoglobin: 7.4 g/dL — ABNORMAL LOW (ref 12.0–15.0)
Immature Granulocytes: 8 %
Lymphocytes Relative: 11 %
Lymphs Abs: 0.5 10*3/uL — ABNORMAL LOW (ref 0.7–4.0)
MCH: 31.2 pg (ref 26.0–34.0)
MCHC: 32.6 g/dL (ref 30.0–36.0)
MCV: 95.8 fL (ref 80.0–100.0)
Monocytes Absolute: 0.1 10*3/uL (ref 0.1–1.0)
Monocytes Relative: 3 %
Neutro Abs: 3.7 10*3/uL (ref 1.7–7.7)
Neutrophils Relative %: 77 %
Platelets: 138 10*3/uL — ABNORMAL LOW (ref 150–400)
RBC: 2.37 MIL/uL — ABNORMAL LOW (ref 3.87–5.11)
RDW: 15.1 % (ref 11.5–15.5)
WBC: 4.8 10*3/uL (ref 4.0–10.5)
nRBC: 0 % (ref 0.0–0.2)

## 2020-02-12 LAB — MRSA PCR SCREENING: MRSA by PCR: NEGATIVE

## 2020-02-12 LAB — MAGNESIUM: Magnesium: 1.4 mg/dL — ABNORMAL LOW (ref 1.7–2.4)

## 2020-02-12 LAB — PHOSPHORUS: Phosphorus: 2.6 mg/dL (ref 2.5–4.6)

## 2020-02-12 MED ORDER — PREDNISONE 20 MG PO TABS
40.0000 mg | ORAL_TABLET | Freq: Every day | ORAL | Status: DC
  Administered 2020-02-12 – 2020-02-14 (×3): 40 mg via ORAL
  Filled 2020-02-12 (×3): qty 2

## 2020-02-12 MED ORDER — HEPARIN SOD (PORK) LOCK FLUSH 100 UNIT/ML IV SOLN: 500.0000 [IU] | Freq: Every day | INTRAVENOUS | Status: DC | PRN

## 2020-02-12 MED ORDER — SODIUM CHLORIDE 0.9% IV SOLUTION: 250.0000 mL | Freq: Once | INTRAVENOUS | Status: DC

## 2020-02-12 MED ORDER — OXYCODONE HCL 5 MG PO TABS: 10.0000 mg | ORAL_TABLET | ORAL | Status: DC | PRN

## 2020-02-12 MED ORDER — COLCHICINE 0.3 MG HALF TABLET
0.3000 mg | ORAL_TABLET | ORAL | Status: DC
  Administered 2020-02-14: 0.3 mg via ORAL
  Filled 2020-02-12: qty 1

## 2020-02-12 MED ORDER — SODIUM CHLORIDE 0.9% FLUSH: 10.0000 mL | INTRAVENOUS | Status: DC | PRN

## 2020-02-12 MED ORDER — MAGNESIUM SULFATE 2 GM/50ML IV SOLN
2.0000 g | Freq: Once | INTRAVENOUS | Status: AC
  Administered 2020-02-12: 2 g via INTRAVENOUS
  Filled 2020-02-12: qty 50

## 2020-02-12 MED ORDER — HEPARIN SOD (PORK) LOCK FLUSH 100 UNIT/ML IV SOLN
500.0000 [IU] | Freq: Every day | INTRAVENOUS | Status: DC | PRN
  Filled 2020-02-12: qty 5

## 2020-02-12 MED ORDER — SODIUM CHLORIDE 0.9 % IV SOLN
2.0000 g | INTRAVENOUS | Status: DC
  Administered 2020-02-12 – 2020-02-13 (×2): 2 g via INTRAVENOUS
  Filled 2020-02-12 (×2): qty 2

## 2020-02-12 MED ORDER — CARVEDILOL 12.5 MG PO TABS
12.5000 mg | ORAL_TABLET | Freq: Two times a day (BID) | ORAL | Status: DC
  Administered 2020-02-12 – 2020-02-14 (×4): 12.5 mg via ORAL
  Filled 2020-02-12 (×4): qty 1

## 2020-02-12 MED ORDER — SODIUM CHLORIDE 0.9% FLUSH: 3.0000 mL | INTRAVENOUS | Status: DC | PRN

## 2020-02-12 MED ORDER — HEPARIN SOD (PORK) LOCK FLUSH 100 UNIT/ML IV SOLN: 250.0000 [IU] | INTRAVENOUS | Status: DC | PRN

## 2020-02-12 NOTE — Progress Notes (Signed)
[name]  [phone] [phone]     ID: [name] DOB: [birth year]  MR#: [id number]  AOZ#:[id number]  Patient Care Team: [name], MD as PCP - General (Family Medicine) Mauro Kaufmann, RN as Oncology Nurse Navigator Rockwell Germany, RN as Oncology Nurse Navigator [name], MD as Consulting Physician (General Surgery) Magrinat, Virgie Dad, MD as Consulting Physician (Oncology) Kyung Rudd, MD as Consulting Physician (Radiation Oncology) Edrick Kins, DPM as Consulting Physician (Podiatry) Mikey Bussing, NP OTHER MD:  CHIEF COMPLAINT: estrogen receptor positive breast cancer  CURRENT TREATMENT: Adjuvant chemotherapy    INTERVAL HISTORY: [name] was admitted for cellulitis of the left lower extremity and hypertensive emergency.  The patient developed pain in her left lower extremity which started the day prior to admission.  She also had swelling in the left ankle which extended up to the lower left leg.  Ultrasound negative for DVT.  She started on IV vancomycin and ceftriaxone.  Due to severe tachycardia and leukocytosis, code sepsis was called.  Of note, the patient received G-CSF on 02/07/2020 as part of her chemotherapy regimen.  She was also started on nitroglycerin infusion due to elevated BP.  A uric acid level was checked on 02/11/20 due to concern for gout and was elevated at 9.1.  She was started on colchicine and Solu-Medrol.   REVIEW OF SYSTEMS: [name] tolerated her first cycle of dose dense AC well overall and did not have any nausea or vomiting.  She reports that she had a decreased appetite over the weekend.  States that her left ankle and foot pain is better today.  However, she still cannot walk on it. The remainder of the ROS is noncontributory.   HISTORY OF CURRENT ILLNESS: From the original intake note:  [name] (pronounced "yellow") had routine screening mammography on 10/24/2019 showing a possible  abnormality in the left breast. She underwent left breast ultrasonography at [facility] on 11/05/2019 showing: suspicious 3.8 cm mass in left breast at 3 o'clock; five abnormal lymph nodes.  Accordingly on 11/12/2019 she proceeded to biopsy of the left breast area in question as well as a lymph node. The pathology from this procedure ([id number]) showed: invasive ductal carcinoma with prominent inflammatory response, grade 3. Prognostic indicators significant for: estrogen receptor, 100% positive and progesterone receptor, 50% positive, both with strong staining intensity. Proliferation marker Ki67 at 70%. HER2 negative by immunohistochemistry (0).  The biopsied lymph node confirmed metastatic breast carcinoma.  The patient's subsequent history is as detailed below.   PAST MEDICAL HISTORY: Past Medical History:  Diagnosis Date  . Arthritis   . Cancer ([place]) 11/2019   Left breast  . Diverticulitis   . Family history of ovarian cancer   . Heart murmur    11/26/19 echo: Mild AS. AV mean gradient 12.0 mmHg; however, LVOT gradient 8 mmHg with intracvitary gradient-significant AS is not suspected  . Hypertension     PAST SURGICAL HISTORY: Past Surgical History:  Procedure Laterality Date  . MASTECTOMY WITH AXILLARY LYMPH NODE DISSECTION Left 12/19/2019   Procedure: LEFT MASTECTOMY WITH AXILLARY LYMPH NODE DISSECTION;  Surgeon: [name], MD;  Location: [facility];  Service: General;  Laterality: Left;  Marland Kitchen MULTIPLE EXTRACTIONS WITH ALVEOLOPLASTY Bilateral 12/14/2019   Procedure: MULTIPLE EXTRACTION WITH ALVEOLOPLASTY;  Surgeon: [name], DDS;  Location: [place];  Service: Oral Surgery;  Laterality: Bilateral;  . PORTACATH PLACEMENT N/A 12/19/2019   Procedure: INSERTION PORT-A-CATH WITH ULTRASOUND GUIDANCE;  Surgeon: [name],  MD;  Location: [facility];  Service: General;  Laterality: N/A;  . RADIAL HEAD ARTHROPLASTY Right 06/25/2019   Procedure: RIGHT RADIAL HEAD ARTHROPLASTY;   Surgeon: [name], MD;  Location: [place];  Service: Orthopedics;  Laterality: Right;  . TUBAL LIGATION      FAMILY HISTORY: Family History  Problem Relation Age of Onset  . Hypertension Mother   . Dementia Mother   . Ovarian cancer Half-Sister 20  . Diabetes Half-Sister   . Stroke Half-Sister   Her father died at age 19; she is unsure of the cause of death. Her mother is age 35 as of 10/2019.  The patient has two brothers and four sisters. She reports ovarian cancer in her sister at age 26. She denies a family history of breast, prostate, or pancreatic cancer.   GYNECOLOGIC HISTORY:  No LMP recorded. Patient is postmenopausal. Menarche: 64 years old Age at first live birth: 64 years old [place] 2 LMP around age 61 Contraceptive: used previously HRT never used  Hysterectomy? no BSO? no   SOCIAL HISTORY: (updated 10/2019)  [name] retired from working at the [employer]. She is divorced. She lives at home alone but during the pandemic her sister [name] and mother [name] lived in the same home. [name], age 49, lives in [place] and is handicapped. [name], age 30, is a Biomedical scientist here in [place]. [name] has two granddaughters. She attends a local [employer].    ADVANCED DIRECTIVES: not in place; at the 11/21/2019 visit the patient was given the appropriate documents to complete and notarized at her discretion.  She expressed spontaneously that she would not want to be resuscitated in case of a terminal event.    HEALTH MAINTENANCE: Social History   Tobacco Use  . Smoking status: Never Smoker  . Smokeless tobacco: Never Used  Vaping Use  . Vaping Use: Never used  Substance Use Topics  . Alcohol use: Yes    Alcohol/week: 3.0 standard drinks    Types: 1 Cans of beer, 2 Shots of liquor per week    Comment: daily  . Drug use: Yes    Types: Marijuana     Colonoscopy: never done  PAP: "1980?"  Bone density: never done   Allergies  Allergen Reactions  .  Amlodipine Swelling    BLE edema    Current Facility-Administered Medications  Medication Dose Route Frequency Provider Last Rate Last Admin  . acetaminophen (TYLENOL) tablet 650 mg  650 mg Oral Q6H PRN Allie Bossier, MD   650 mg at 02/10/20 1527   Or  . acetaminophen (TYLENOL) suppository 650 mg  650 mg Rectal Q6H PRN Allie Bossier, MD      . aspirin EC tablet 81 mg  81 mg Oral Daily Allie Bossier, MD   81 mg at 02/12/20 1013  . cefTRIAXone (ROCEPHIN) 2 g in sodium chloride 0.9 % 100 mL IVPB  2 g Intravenous Q24H Patrecia Pour, MD   Stopped at 02/12/20 1044  . Chlorhexidine Gluconate Cloth 2 % PADS 6 each  6 each Topical Daily Allie Bossier, MD   6 each at 02/11/20 1343  . colchicine tablet 0.6 mg  0.6 mg Oral Daily Vance Gather B, MD   0.6 mg at 02/12/20 1013  . heparin injection 5,000 Units  5,000 Units Subcutaneous Q8H Allie Bossier, MD   5,000 Units at 02/12/20 0506  . hydrALAZINE (APRESOLINE) tablet 25 mg  25 mg Oral  Q8H Patrecia Pour, MD   25 mg at 02/12/20 0506  . LORazepam (ATIVAN) injection 1-2 mg  1-2 mg Intravenous Q6H PRN Allie Bossier, MD   2 mg at 02/11/20 0355  . MEDLINE mouth rinse  15 mL Mouth Rinse BID Allie Bossier, MD   15 mL at 02/11/20 0930  . metoprolol tartrate (LOPRESSOR) tablet 50 mg  50 mg Oral BID Patrecia Pour, MD   50 mg at 02/12/20 1013  . ondansetron (ZOFRAN) injection 4 mg  4 mg Intravenous Q6H PRN Allie Bossier, MD      . oxyCODONE (Oxy IR/ROXICODONE) immediate release tablet 15 mg  15 mg Oral Q4H PRN Patrecia Pour, MD      . predniSONE (DELTASONE) tablet 40 mg  40 mg Oral Q breakfast Patrecia Pour, MD   40 mg at 02/12/20 1017  . sodium chloride flush (NS) 0.9 % injection 10-40 mL  10-40 mL Intracatheter Q12H Allie Bossier, MD   10 mL at 02/12/20 1014  . sodium chloride flush (NS) 0.9 % injection 10-40 mL  10-40 mL Intracatheter PRN Allie Bossier, MD      . sorbitol 70 % solution 30 mL  30 mL Oral Daily PRN Allie Bossier, MD         OBJECTIVE: African-American woman examined in bed  Vitals:   02/12/20 0800 02/12/20 0851  BP:    Pulse: (!) 103   Resp: 16   Temp:  (!) 97.3 F (36.3 C)  SpO2: 100%      Body mass index is 58.01 kg/m.   Wt Readings from Last 3 Encounters:  02/10/20 (!) 139.3 kg  02/05/20 (!) 139.4 kg  01/27/20 (!) 142.7 kg     ECOG FS:2 - Symptomatic, <50% confined to bed  Sclerae unicteric, EOMs intact Lungs no rales or rhonchi, Heart regular rate and rhythm Abd soft, obese, nontender Neuro: nonfocal, well oriented, appropriate affect LE: Left foot/ankle tender to light touch.   LAB RESULTS:  CMP     Component Value Date/Time   NA 137 02/12/2020 0500   NA 135 01/02/2020 1710   K 3.9 02/12/2020 0500   CL 105 02/12/2020 0500   CO2 21 (L) 02/12/2020 0500   GLUCOSE 147 (H) 02/12/2020 0500   BUN 31 (H) 02/12/2020 0500   BUN 25 01/02/2020 1710   CREATININE 1.34 (H) 02/12/2020 0500   CREATININE 1.59 (H) 11/21/2019 0908   CALCIUM 7.7 (L) 02/12/2020 0500   PROT 7.1 02/12/2020 0500   PROT 8.5 06/15/2019 1622   ALBUMIN 2.6 (L) 02/12/2020 0500   ALBUMIN 4.3 06/15/2019 1622   AST 19 02/12/2020 0500   AST 61 (H) 11/21/2019 0908   ALT 20 02/12/2020 0500   ALT 32 11/21/2019 0908   ALKPHOS 59 02/12/2020 0500   BILITOT 1.1 02/12/2020 0500   BILITOT 0.5 11/21/2019 0908   GFRNONAA 42 (L) 02/12/2020 0500   GFRNONAA 34 (L) 11/21/2019 0908   GFRAA 48 (L) 02/12/2020 0500   GFRAA 39 (L) 11/21/2019 0908    No results found for: TOTALPROTELP, ALBUMINELP, A1GS, A2GS, BETS, BETA2SER, GAMS, MSPIKE, SPEI  Lab Results  Component Value Date   WBC 4.8 02/12/2020   NEUTROABS 3.7 02/12/2020   HGB 7.4 (L) 02/12/2020   HCT 22.7 (L) 02/12/2020   MCV 95.8 02/12/2020   PLT 138 (L) 02/12/2020    No results found for: LABCA2  No components found for: [id number]  Recent Labs  Lab  02/10/20 0923  INR 1.2    No results found for: LABCA2  No results found for: MHW808  No results found for:  UPJ031  No results found for: RXY585  No results found for: CA2729  No components found for: HGQUANT  No results found for: CEA1 / No results found for: CEA1   No results found for: AFPTUMOR  No results found for: CHROMOGRNA  No results found for: KPAFRELGTCHN, LAMBDASER, KAPLAMBRATIO (kappa/lambda light chains)  No results found for: HGBA, HGBA2QUANT, HGBFQUANT, HGBSQUAN (Hemoglobinopathy evaluation)   No results found for: LDH  Lab Results  Component Value Date   IRON 111 01/22/2020   TIBC 231 (L) 01/22/2020   IRONPCTSAT 48 (H) 01/22/2020   (Iron and TIBC)  Lab Results  Component Value Date   FERRITIN 3000 (H) 11/10/2007    Urinalysis    Component Value Date/Time   COLORURINE YELLOW 02/10/2020 1427   APPEARANCEUR CLEAR 02/10/2020 1427   LABSPEC 1.012 02/10/2020 1427   PHURINE 5.0 02/10/2020 1427   GLUCOSEU NEGATIVE 02/10/2020 1427   HGBUR SMALL (A) 02/10/2020 1427   BILIRUBINUR NEGATIVE 02/10/2020 1427   KETONESUR NEGATIVE 02/10/2020 1427   PROTEINUR NEGATIVE 02/10/2020 1427   UROBILINOGEN 1.0 07/14/2012 1630   NITRITE NEGATIVE 02/10/2020 [place] 02/10/2020 1427     STUDIES: DG Ankle 2 Views Left  Result Date: 02/11/2020 CLINICAL DATA:  Swelling and warmth EXAM: LEFT ANKLE: 2 VIEW COMPARISON:  November 04, 2018 FINDINGS: Frontal and lateral views were obtained. There is marked soft tissue swelling. No fracture or joint effusion. No appreciable joint space narrowing. There is a posterior calcaneal spur. No erosive change or bony destruction. IMPRESSION: Diffuse soft tissue swelling. No fracture or bony destruction. No erosion. No appreciable joint space narrowing. There is a posterior calcaneal spur. Electronically Signed   By: Lowella Grip III M.D.   On: 02/11/2020 10:22   US RENAL  Result Date: 01/22/2020 CLINICAL DATA:  Acute renal failure. EXAM: RENAL / URINARY TRACT ULTRASOUND COMPLETE COMPARISON:  None. FINDINGS: Right Kidney:  Renal measurements: 12.6 x 5.4 x 6.4 cm = volume: 224 mL . Echogenicity within normal limits. No mass or hydronephrosis visualized. Left Kidney: Renal measurements: 11.2 x 6.0 x 5.7 cm = volume: 202 mL. Echogenicity within normal limits. No mass or hydronephrosis visualized. Bladder: Appears normal for degree of bladder distention. Other: None. IMPRESSION: Normal renal ultrasound. Electronically Signed   By: Lajean Manes M.D.   On: 01/22/2020 14:46   DG Chest Portable 1 View  Result Date: 02/10/2020 CLINICAL DATA:  Breast carcinoma with recent blood transfusion. Concern for potential sepsis EXAM: PORTABLE CHEST 1 VIEW COMPARISON:  December 19, 2019 FINDINGS: Port-A-Cath tip is in the superior vena cava. No pneumothorax. The lungs are clear. The heart is mildly enlarged with pulmonary vascularity normal. No adenopathy. There are surgical clips in the left axillary region. IMPRESSION: Port-A-Cath as described without pneumothorax. Lungs clear. Mild cardiomegaly. No evident adenopathy. Electronically Signed   By: Lowella Grip III M.D.   On: 02/10/2020 09:53   DG Foot 2 Views Left  Result Date: 02/11/2020 CLINICAL DATA:  Soft tissue swelling and warmth EXAM: LEFT FOOT - 2 VIEW COMPARISON:  November 04, 2018 FINDINGS: Frontal and lateral views were obtained. There is marked soft tissue swelling. No evident fracture or dislocation. There is degenerative change with narrowing of the first MTP joint. Other joint spaces appear unremarkable. There is a posterior calcaneal spur. No erosive change or bony destruction.  Bones appear somewhat osteoporotic. IMPRESSION: Marked soft tissue swelling. No bony destruction or erosion. Narrowing first MTP joint. Other joint spaces appear unremarkable. There is a posterior calcaneal spur. Electronically Signed   By: Lowella Grip III M.D.   On: 02/11/2020 10:18   ECHOCARDIOGRAM COMPLETE  Result Date: 02/11/2020    ECHOCARDIOGRAM REPORT   Patient Name:   [name] Date of  Exam: 02/11/2020 Medical Rec #:  [id number]     Height:       61.0 in Accession #:    [id number]    Weight:       307.0 lb Date of Birth:  12/19/1955    BSA:          2.266 m Patient Age:    4 years      BP:           164/71 mmHg Patient Gender: F             HR:           123 bpm. Exam Location:  Inpatient Procedure: 2D Echo, Cardiac Doppler, Color Doppler and Intracardiac            Opacification Agent Indications:    Hypertensive emergency  History:        Patient has prior history of Echocardiogram examinations, most                 recent 01/22/2020. Risk Factors:Hypertension and Non-Smoker.                 Breast cancer.  Sonographer:    Vickie Epley RDCS Referring Phys: [id number] [place]  1. Left ventricular ejection fraction, by estimation, is 70 to 75%. The left ventricle has hyperdynamic function. The left ventricle has no regional wall motion abnormalities. Left ventricular diastolic parameters are consistent with Grade I diastolic dysfunction (impaired relaxation). Elevated left ventricular end-diastolic pressure.  2. Right ventricular systolic function is normal. The right ventricular size is normal. Tricuspid regurgitation signal is inadequate for assessing PA pressure.  3. The mitral valve is normal in structure. No evidence of mitral valve regurgitation. No evidence of mitral stenosis.  4. The aortic valve is normal in structure. Aortic valve regurgitation is not visualized. No aortic stenosis is present.  5. The inferior vena cava is normal in size with greater than 50% respiratory variability, suggesting right atrial pressure of 3 mmHg. FINDINGS  Left Ventricle: Left ventricular ejection fraction, by estimation, is 70 to 75%. The left ventricle has hyperdynamic function. The left ventricle has no regional wall motion abnormalities. Definity contrast agent was given IV to delineate the left ventricular endocardial borders. The left ventricular internal cavity size was normal in size.  There is no left ventricular hypertrophy. Left ventricular diastolic parameters are consistent with Grade I diastolic dysfunction (impaired relaxation). Elevated left ventricular end-diastolic pressure. Right Ventricle: The right ventricular size is normal. No increase in right ventricular wall thickness. Right ventricular systolic function is normal. Tricuspid regurgitation signal is inadequate for assessing PA pressure. Left Atrium: Left atrial size was normal in size. Right Atrium: Right atrial size was normal in size. Pericardium: There is no evidence of pericardial effusion. Mitral Valve: The mitral valve is normal in structure. Normal mobility of the mitral valve leaflets. No evidence of mitral valve regurgitation. No evidence of mitral valve stenosis. Tricuspid Valve: The tricuspid valve is normal in structure. Tricuspid valve regurgitation is trivial. No evidence of tricuspid stenosis. Aortic Valve: The aortic valve is  normal in structure. Aortic valve regurgitation is not visualized. No aortic stenosis is present. Pulmonic Valve: The pulmonic valve was normal in structure. Pulmonic valve regurgitation is not visualized. No evidence of pulmonic stenosis. Aorta: The aortic root is normal in size and structure. Venous: The inferior vena cava is normal in size with greater than 50% respiratory variability, suggesting right atrial pressure of 3 mmHg. IAS/Shunts: No atrial level shunt detected by color flow Doppler.  LEFT VENTRICLE PLAX 2D LVIDd:         3.80 cm  Diastology LVIDs:         2.30 cm  LV e' lateral:   8.49 cm/s LV PW:         0.80 cm  LV E/e' lateral: 13.9 LV IVS:        0.80 cm  LV e' medial:    7.18 cm/s LVOT diam:     1.70 cm  LV E/e' medial:  16.4 LV SV:         51 LV SV Index:   23 LVOT Area:     2.27 cm  RIGHT VENTRICLE RV S prime:     16.40 cm/s TAPSE (M-mode): 1.9 cm LEFT ATRIUM             Index       RIGHT ATRIUM           Index LA diam:        3.30 cm 1.46 cm/m  RA Area:     12.60 cm LA  Vol (A2C):   39.3 ml 17.34 ml/m RA Volume:   27.30 ml  12.05 ml/m LA Vol (A4C):   38.0 ml 16.77 ml/m LA Biplane Vol: 41.6 ml 18.36 ml/m  AORTIC VALVE LVOT Vmax:   170.00 cm/s LVOT Vmean:  121.000 cm/s LVOT VTI:    0.225 m  AORTA Ao Root diam: 2.60 cm MITRAL VALVE MV Area (PHT): 5.75 cm     SHUNTS MV Decel Time: 132 msec     Systemic VTI:  0.22 m MV E velocity: 118.00 cm/s  Systemic Diam: 1.70 cm MV A velocity: 159.00 cm/s MV E/A ratio:  0.74 Fransico Him MD Electronically signed by Fransico Him MD Signature Date/Time: 02/11/2020/9:20:01 AM    Final    VAS [place] LOWER EXTREMITY VENOUS (DVT) (ONLY [facility] & [facility])  Result Date: 02/10/2020  Lower Venous DVTStudy Indications: Swelling.  Limitations: Body habitus. Comparison Study: 11/04/18 negative Performing Technologist: June Leap RDMS, RVT  Examination Guidelines: A complete evaluation includes B-mode imaging, spectral Doppler, color Doppler, and power Doppler as needed of all accessible portions of each vessel. Bilateral testing is considered an integral part of a complete examination. Limited examinations for reoccurring indications may be performed as noted. The reflux portion of the exam is performed with the patient in reverse Trendelenburg.  +---------+---------------+---------+-----------+----------+--------------+ LEFT     CompressibilityPhasicitySpontaneityPropertiesThrombus Aging +---------+---------------+---------+-----------+----------+--------------+ CFV      Full           Yes      Yes                                 +---------+---------------+---------+-----------+----------+--------------+ SFJ      Full                                                        +---------+---------------+---------+-----------+----------+--------------+  FV Prox  Full                                                        +---------+---------------+---------+-----------+----------+--------------+ FV Mid   Full                                                         +---------+---------------+---------+-----------+----------+--------------+ FV DistalFull                                                        +---------+---------------+---------+-----------+----------+--------------+ PFV                                                   Not visualized +---------+---------------+---------+-----------+----------+--------------+ POP      Full           Yes      Yes                                 +---------+---------------+---------+-----------+----------+--------------+ PTV      Full                                                        +---------+---------------+---------+-----------+----------+--------------+ PERO                                                  Not visualized +---------+---------------+---------+-----------+----------+--------------+     Summary: LEFT: - There is no evidence of deep vein thrombosis in the lower extremity. However, portions of this examination were limited- see technologist comments above.  *See table(s) above for measurements and observations. Electronically signed by Monica Martinez MD on 02/10/2020 at 11:15:19 AM.    Final    ECHOCARDIOGRAM LIMITED  Result Date: 01/22/2020    ECHOCARDIOGRAM LIMITED REPORT   Patient Name:   [name] Date of Exam: 01/22/2020 Medical Rec #:  [id number]     Height:       61.0 in Accession #:    [id number]    Weight:       303.9 lb Date of Birth:  1956-05-11    BSA:          2.256 m Patient Age:    69 years      BP:           92/60 mmHg Patient Gender: F             HR:           73 bpm. Exam Location:  Inpatient  Procedure: Limited Echo Indications:    Pericardial effusion  History:        Patient has prior history of Echocardiogram examinations, most                 recent 11/26/2019. Risk Factors:Hypertension. Breast cancer, s/p                 mastectomy.  Sonographer:    Dustin Flock Referring Phys: [place]  1. Left  ventricular ejection fraction, by estimation, is 65 to 70%. The left ventricle has normal function. The left ventricle has no regional wall motion abnormalities. There is mild left ventricular hypertrophy.  2. Right ventricular systolic function is normal. The right ventricular size is normal.  3. The inferior vena cava is normal in size with greater than 50% respiratory variability, suggesting right atrial pressure of 3 mmHg.  4. No pericardial effusion.  5. Limited echo with no doppler. FINDINGS  Left Ventricle: Left ventricular ejection fraction, by estimation, is 65 to 70%. The left ventricle has normal function. The left ventricle has no regional wall motion abnormalities. The left ventricular internal cavity size was normal in size. There is  mild left ventricular hypertrophy. Right Ventricle: The right ventricular size is normal. No increase in right ventricular wall thickness. Right ventricular systolic function is normal. Left Atrium: Left atrial size was normal in size. Right Atrium: Right atrial size was normal in size. Pericardium: There is no evidence of pericardial effusion. Venous: The inferior vena cava is normal in size with greater than 50% respiratory variability, suggesting right atrial pressure of 3 mmHg. IAS/Shunts: No atrial level shunt detected by color flow Doppler.  LEFT VENTRICLE PLAX 2D LVIDd:         3.30 cm LVIDs:         1.80 cm LV PW:         1.50 cm LV IVS:        1.30 cm  Loralie Champagne MD Electronically signed by Loralie Champagne MD Signature Date/Time: 01/22/2020/3:19:05 PM    Final      ELIGIBLE FOR AVAILABLE RESEARCH PROTOCOL: AET  ASSESSMENT: 64 y.o. [place] woman status post left breast upper outer quadrant and left axillary lymph node biopsies 11/12/2019 for a clinical T2 N2, stage IIIA invasive ductal carcinoma, grade 3, estrogen and progesterone receptor positive, HER-2 not amplified, with an MIB-1 of 70%  (a) the biopsied axillary lymph node was positive  (1)  genetics testing 11/29/2019 through the Breast Cancer STAT Panel offered by [facility] found no deleterious mutations in ATM, BRCA1, BRCA2, CDH1, CHEK2, PALB2, PTEN, STK11 and TP53.  Additional testing through the Common Hereditary Cancers Panel confirmed no deleterious mutations in APC, ATM, AXIN2, BARD1, BMPR1A, BRCA1, BRCA2, BRIP1, CDH1, CDK4, CDKN2A (p14ARF), CDKN2A (p16INK4a), CHEK2, CTNNA1, DICER1, EPCAM (Deletion/duplication testing only), GREM1 (promoter region deletion/duplication testing only), KIT, MEN1, MLH1, MSH2, MSH3, MSH6, MUTYH, NBN, NF1, NHTL1, PALB2, PDGFRA, PMS2, POLD1, POLE, PTEN, RAD50, RAD51C, RAD51D, RNF43, SDHB, SDHC, SDHD, SMAD4, SMARCA4. STK11, TP53, TSC1, TSC2, and VHL.  The following genes were evaluated for sequence changes only: SDHA and HOXB13 c.251G>A variant only.  (2) status post left modified radical mastectomy 12/19/2019 for a pT2 pN1, stage IIB invasive ductal carcinoma, grade 3, with negative margins.  (a) 1 of 13 axillary lymph nodes removed was positive  (3) adjuvant chemotherapy will consist of cyclophosphamide and doxorubicin in dose dense fashion x4 starting 01/22/2020 followed by weekly paclitaxel x12  (a) baseline echocardiogram 11/26/2019 shows  an ejection fraction in the 65-70% range.  (b) first cycle of cyclophosphamide and doxorubicin dose reduced to assess tolerance  (4) adjuvant radiation as appropriate  (5) antiestrogens to follow   PLAN: [name] tolerated her first cycle of chemo well overall. CBC from today has been reviewed which shows anemia and thrombocytopenia.  Her anemia is stable.  Her white blood cell count is normal today.  It was likely elevated on admission secondary to G-CSF.  Her left foot/ankle pain is improving. She is on antibiotics for cellulitis and treatment for gout and is responding. I do not typically see gout caused by Adriamycin or Cytoxan and think these are less likely the cause of her symptoms.   Mikey Bussing, DNP,  AGPCNP-BC, AOCNP Mon/Tues/Thurs/Fri 7am-5pm; Off Wednesdays Cell: [phone]

## 2020-02-12 NOTE — Evaluation (Addendum)
Physical Therapy Evaluation Patient Details Name: [name] MRN: [id number] DOB: 1955-12-01 Today's Date: 02/12/2020   History of Present Illness  Dillard is a 64 y.o. female with a history of left breast CA Dx April 2021 s/p mastectomy and undergoing chemotherapy, lymphedema, poor wound healing, venous insufficiency, stage III CKD with previous acute renal failure, EtOH use who presented to the [facility] 6/13 with left leg/foot swelling and pain beginning in the ankle and extending proximally over the course of 1 day. Exam was concerning for cellulitis versus DVT. U/S was negative for DVT. WBC 24.4k, SCr near baseline at 1.21, hgb near baseline at 8.4g/dl. Vancomycin and ceftriaxone were given for cellulitis. Code sepsis was called due to severe tachycardia, leukocytosis. BP was elevated . Recently in hospital for hypotension, AKI on CKD  Clinical Impression  The patient is unable to tolerate any pressure on the left foot when sitting, therefore unable to attempt standing up/transfers. PTA, patient ambulatory. Hopefully, pain will subside so patient will tolerate ambulation. Pt admitted with above diagnosis.  Pt currently with functional limitations due to the deficits listed below (see PT Problem List). Pt will benefit from skilled PT to increase their independence and safety with mobility to allow discharge to the venue listed below.     Follow Up Recommendations Home health PT;Supervision/Assistance - 24 hour    Equipment Recommendations  None recommended by PT    Recommendations for Other Services   OT    Precautions / Restrictions Precautions Precautions: Fall Precaution Comments: unable to bear weight on left foot, , pt 5'1''      Mobility  Bed Mobility Overal bed mobility: Needs Assistance Bed Mobility: Supine to Sit;Sit to Supine     Supine to sit: Mod assist;HOB elevated Sit to supine: Mod assist   General bed mobility comments: assit LLE to move to bed edge, patient pulled  on bed rails, HOB up maximally  Transfers                 General transfer comment: unable to tolerate weight on left foot when tested against foot stool. Patient also short instature and bed height is high.  Ambulation/Gait                Stairs            Wheelchair Mobility    Modified Rankin (Stroke Patients Only)       Balance Overall balance assessment: Needs assistance Sitting-balance support: Feet supported;Bilateral upper extremity supported Sitting balance-Leahy Scale: Fair                                       Pertinent Vitals/Pain Pain Assessment: 0-10 Pain Score: 10-Worst pain ever Pain Location: left anklwe when touched, attempt sock placement Pain Descriptors / Indicators: Discomfort;Moaning;Stabbing Pain Intervention(s): Monitored during session;Limited activity within patient's tolerance    Home Living Family/patient expects to be discharged to:: Private residence Living Arrangements: Other relatives Available Help at Discharge: Family;Available 24 hours/day Type of Home: Apartment Home Access: Level entry     Home Layout: One level Home Equipment: Grab bars - toilet;Grab bars - tub/shower;Wheelchair - [employer] - 2 wheels;Cane - single point;Shower seat      Prior Function Level of Independence: Needs assistance   Gait / Transfers Assistance Needed: Reports can ambulate community distances with AD  ADL's / Homemaking Assistance Needed: Reports independent with ADLs and light IADLs  Comments: Reports hardest thing is getting up and down; Typically sleeps in straight back chair with foot stool     Hand Dominance        Extremity/Trunk Assessment   Upper Extremity Assessment Upper Extremity Assessment: Overall WFL for tasks assessed    Lower Extremity Assessment Lower Extremity Assessment: LLE deficits/detail RLE Deficits / Details: grossly WFL but NT due to inability to test standing LLE Deficits /  Details: does not tolerate test, except to pick up the leg from bed LLE: Unable to fully assess due to pain    Cervical / Trunk Assessment Cervical / Trunk Exceptions: some limitations in trunk and extremity mobility due to body habitus  Communication      Cognition Arousal/Alertness: Awake/alert Behavior During Therapy: WFL for tasks assessed/performed Overall Cognitive Status: Within Functional Limits for tasks assessed                                        General Comments      Exercises     Assessment/Plan    PT Assessment Patient needs continued PT services  PT Problem List Decreased strength;Decreased mobility;Decreased safety awareness;Decreased range of motion;Decreased coordination;Decreased activity tolerance;Cardiopulmonary status limiting activity;Decreased balance;Decreased knowledge of use of DME       PT Treatment Interventions DME instruction;Therapeutic activities;Gait training;Therapeutic exercise;Patient/family education;Balance training;Functional mobility training    PT Goals (Current goals can be found in the Care Plan section)  Acute Rehab PT Goals Patient Stated Goal: return home; build endurance PT Goal Formulation: With patient Time For Goal Achievement: 02/26/20 Potential to Achieve Goals: Fair    Frequency Min 3X/week   Barriers to discharge        Co-evaluation               AM-PAC PT "6 Clicks" Mobility  Outcome Measure Help needed turning from your back to your side while in a flat bed without using bedrails?: A Lot Help needed moving from lying on your back to sitting on the side of a flat bed without using bedrails?: A Lot Help needed moving to and from a bed to a chair (including a wheelchair)?: Total Help needed standing up from a chair using your arms (e.g., wheelchair or bedside chair)?: Total Help needed to walk in hospital room?: Total Help needed climbing 3-5 steps with a railing? : Total 6 Click Score:  8    End of Session   Activity Tolerance: Patient limited by pain Patient left: in bed;with call bell/phone within reach Nurse Communication: Mobility status PT Visit Diagnosis: Other abnormalities of gait and mobility (R26.89);Muscle weakness (generalized) (M62.81)    Time: 0932-6712 PT Time Calculation (min) (ACUTE ONLY): 16 min   Charges:   PT Evaluation $PT Eval Low Complexity: [facility] Acute Rehabilitation Services Pager [phone] Office [phone]   Claretha Cooper 02/12/2020, 11:42 AM

## 2020-02-12 NOTE — Progress Notes (Signed)
PROGRESS NOTE  [name]  IRC:[id number] DOB: May 18, 1956 DOA: 02/10/2020 PCP: [name], MD  Outpatient Specialists: Oncology, Dr. Jana Hakim Brief Narrative: [name] is a 64 y.o. female with a history of left breast CA Dx April 2021 s/p mastectomy and undergoing chemotherapy, lymphedema, poor wound healing, venous insufficiency, stage III CKD with previous acute renal failure, EtOH use who presented to the [facility] 6/13 with left leg/foot swelling and pain beginning in the ankle and extending proximally over the course of 1 day. Exam was concerning for cellulitis versus DVT. U/S was negative for DVT. WBC 24.4k, SCr near baseline at 1.21, hgb near baseline at 8.4g/dl. Vancomycin and ceftriaxone were given for cellulitis. Code sepsis was called due to severe tachycardia, leukocytosis. BP was elevated in the setting of being taken of antihypertensives and nitroglycerin infusion was started, patient admitted to SDU.  Assessment & Plan: Principal Problem:   Cellulitis of left lower extremity without foot Active Problems:   Essential hypertension   Alcohol-induced mood disorder (HCC)   Alcohol dependence (HCC)   Alcoholic hepatitis   Malignant neoplasm of upper-outer quadrant of left breast in female, estrogen receptor positive ([place])   Morbid obesity with BMI of 60.0-69.9, adult (HCC)   S/P left mastectomy   Hypertensive emergency   Cellulitis of left lower extremity  LLE cellulitis: Complicated by lymphedema, obesity, venous insufficiency. Pt is not diabetic. No DVT on venous U/S. Wound care note recently stated ABI was 1.07. No evidence of wound that is reported to have healed 6/4 on exam. XR not showing erosive changes.  - Narrow abx to ceftriaxone. Suspect leukocytosis on admission was related to GCSF on 6/10, resolved. - Monitor blood cultures (NG <24hrs at this time). Note history of Enterococcus on Cx thought to have been contaminant. - Pain control with oxycodone prn  Suspected  acute gout flare: Abrupt onset of pain and prompt response to steroid suggests thi Dx with elevated uric acid (9.1). - Continue steroid, deescalate to prednisone 40mg  - Plan to continue colchicine until pain has resolved  HTN urgency: Echo with G1DD, LVEF 70%, IVC appeared normal. - Titrated up on beta blocker, will switch metoprolol > coreg for additional alpha blockade to assist with BP lowering - Added hydralazine.  - Will avoid diuretics and RAAS agents for now.  Left breast cancer s/p mastectomy now on adjuvant chemo (cyclophosphamide and doxorubicin), last dose 02/07/2020.  - Added Dr. Jana Hakim to Tx team. NP has evaluated the patient on 6/15.  Anemia: Likely related to chronic disease/malignancy and chemotherapy. - Transfusion threshold is 7g/dl.    Stage IIIa CKD:  - SCr at baseline, will avoid nephrotoxins. Will not restart ARB at this time.   Marijuana use: +THC on UDS. Hx EtOH abuse per report, though not currently and negative level at admission. - Cessation counseling provided.   Anxiety:  - Continue prn  Hypomagnesemia:  - Supplement again today and monitor.   Alcohol use, hepatitis: Does not appear to be active issue.  Obesity: Estimated body mass index is 58.01 kg/m as calculated from the following:   Height as of this encounter: 5\' 1"  (1.549 m).   Weight as of this encounter: 139.3 kg.  DVT prophylaxis: Heparin Code Status: DNR Family Communication: None at bedside Disposition Plan:  Status is: Inpatient. PT/OT ordered. Can transfer to medical floor.  Remains inpatient appropriate because:IV treatments appropriate due to intensity of illness or inability to take PO. Pt's HTN urgency is resolving, still titrating medications for this. Pain is  improved though she's no where near her functional baseline.   Dispo: The patient is from: Home              Anticipated d/c is to: TBD              Anticipated d/c date is: 1 day              Patient currently is not  medically stable to d/c.  Consultants:   None  Procedures:   None  Antimicrobials:  Vancomycin, zosyn 6/13 - 6/15  Ceftriaxone 6/15 >>   Subjective: Pain in left foot and ankle is improved, constant, worse with palpation, moderate-severe. No fevers. No HA, vision changes or chest pain.  Objective: Vitals:   02/12/20 0851 02/12/20 1013 02/12/20 1200 02/12/20 1314  BP:  (!) 153/86  (!) 150/78  Pulse:  (!) 105  94  Resp:  (!) 21  20  Temp: (!) 97.3 F (36.3 C)  97.7 F (36.5 C)   TempSrc: Oral  Oral   SpO2:  100%  100%  Weight:      Height:        Intake/Output Summary (Last 24 hours) at 02/12/2020 1537 Last data filed at 02/12/2020 1322 Gross per 24 hour  Intake 303.58 ml  Output 2600 ml  Net -2296.42 ml   Filed Weights   02/10/20 0751  Weight: (!) 139.3 kg   Gen: 64 y.o. female in no distress Pulm: Nonlabored breathing room air. Clear. CV: Regular rate and rhythm. No murmur, rub, or gallop. No JVD, no dependent edema. GI: Abdomen soft, non-tender, non-distended, with normoactive bowel sounds.  Ext: Bilateral LE's with irregular hyperpigmentation in lower legs, no hair growth. LLE without evidence of wound, though diffuse severe pitting tender edema on dorsum of foot extending to ankle and somewhat proximal to that joint. Tenderness with movement of the ankle and toes. No focal erythema overlaying a joint, but diffusely erythematous. No fluctuance. Unchanged. Skin: No rashes, lesions or ulcers on visualized skin. Neuro: Alert and oriented. No focal neurological deficits. Psych: Judgement and insight appear fair. Mood euthymic & affect congruent. Behavior is appropriate.    Data Reviewed: I have personally reviewed following labs and imaging studies  CBC: Recent Labs  Lab 02/10/20 0853 02/10/20 1536 02/11/20 0340 02/12/20 0500  WBC 24.4* 22.9* 11.6* 4.8  NEUTROABS 17.2*  --  9.5* 3.7  HGB 8.4* 8.5* 7.7* 7.4*  HCT 24.8* 25.9* 24.1* 22.7*  MCV 92.9 95.6  96.4 95.8  PLT 184 175 138* 564*   Basic Metabolic Panel: Recent Labs  Lab 02/10/20 0853 02/10/20 1536 02/11/20 0340 02/12/20 0500  NA 139  --  138 137  K 3.8  --  4.0 3.9  CL 103  --  104 105  CO2 23  --  22 21*  GLUCOSE 116*  --  121* 147*  BUN 24*  --  28* 31*  CREATININE 1.21* 1.28* 1.24* 1.34*  CALCIUM 7.8*  --  7.4* 7.7*  MG  --   --  1.1* 1.4*  PHOS  --   --  2.9 2.6   GFR: Estimated Creatinine Clearance: 56.5 mL/min (A) (by C-G formula based on SCr of 1.34 mg/dL (H)). Liver Function Tests: Recent Labs  Lab 02/10/20 0853 02/11/20 0340 02/12/20 0500  AST 43* 22 19  ALT 29 22 20   ALKPHOS 73 60 59  BILITOT 1.0 1.4* 1.1  PROT 7.6 7.1 7.1  ALBUMIN 3.2* 2.8* 2.6*   No results  for input(s): LIPASE, AMYLASE in the last 168 hours. No results for input(s): AMMONIA in the last 168 hours. Coagulation Profile: Recent Labs  Lab 02/10/20 0923  INR 1.2   Cardiac Enzymes: No results for input(s): CKTOTAL, CKMB, CKMBINDEX, TROPONINI in the last 168 hours. BNP (last 3 results) No results for input(s): PROBNP in the last 8760 hours. HbA1C: Recent Labs    02/10/20 1536  HGBA1C 5.7*   CBG: No results for input(s): GLUCAP in the last 168 hours. Lipid Profile: Recent Labs    02/10/20 1536  CHOL 216*  HDL 100  LDLCALC 96  TRIG 102  CHOLHDL 2.2   Thyroid Function Tests: No results for input(s): TSH, T4TOTAL, FREET4, T3FREE, THYROIDAB in the last 72 hours. Anemia Panel: No results for input(s): VITAMINB12, FOLATE, FERRITIN, TIBC, IRON, RETICCTPCT in the last 72 hours. Urine analysis:    Component Value Date/Time   COLORURINE YELLOW 02/10/2020 1427   APPEARANCEUR CLEAR 02/10/2020 1427   LABSPEC 1.012 02/10/2020 1427   PHURINE 5.0 02/10/2020 1427   GLUCOSEU NEGATIVE 02/10/2020 1427   HGBUR SMALL (A) 02/10/2020 1427   BILIRUBINUR NEGATIVE 02/10/2020 1427   KETONESUR NEGATIVE 02/10/2020 1427   PROTEINUR NEGATIVE 02/10/2020 1427   UROBILINOGEN 1.0 07/14/2012  1630   NITRITE NEGATIVE 02/10/2020 1427   LEUKOCYTESUR NEGATIVE 02/10/2020 1427   Recent Results (from the past 240 hour(s))  Blood culture (routine x 2)     Status: None (Preliminary result)   Collection Time: 02/10/20  8:53 AM   Specimen: BLOOD  Result Value Ref Range Status   Specimen Description   Final    BLOOD Performed at [facility], [place] [address], [place], [place] [address]    Special Requests   Final    BOTTLES DRAWN AEROBIC AND ANAEROBIC Blood Culture adequate volume Performed at [place] [address], [place], [place] [address]    Culture   Final    NO GROWTH 2 DAYS Performed at [facility] [address], [place], [place] [address]    Report Status PENDING  Incomplete  Urine culture     Status: None   Collection Time: 02/10/20  9:23 AM   Specimen: In/Out Cath Urine  Result Value Ref Range Status   Specimen Description   Final    IN/OUT CATH URINE Performed at [facility] [address], [place], [place] [address]    Special Requests   Final    NONE Performed at [facility], [place] [address] [place], [place] [address]    Culture   Final    NO GROWTH Performed at [facility], [place] [address] [place], [place] [address]    Report Status 02/11/2020 FINAL  Final  SARS Coronavirus 2 by RT PCR (hospital order, performed in [facility] hospital lab) Nasopharyngeal Nasopharyngeal Swab     Status: None   Collection Time: 02/10/20 11:57 AM   Specimen: Nasopharyngeal Swab  Result Value Ref Range Status   SARS Coronavirus 2 NEGATIVE NEGATIVE Final    Comment: (NOTE) SARS-CoV-2 target nucleic acids are NOT DETECTED.  The SARS-CoV-2 RNA is generally detectable in upper and lower respiratory specimens during the acute phase of infection. The lowest concentration of SARS-CoV-2 viral copies this assay can detect is 250 copies / mL. A negative result does not preclude  SARS-CoV-2 infection and should not be used as the sole basis for treatment or other patient management decisions.  A negative result  may occur with improper specimen collection / handling, submission of specimen other than nasopharyngeal swab, presence of viral mutation(s) within the areas targeted by this assay, and inadequate number of viral copies (<250 copies / mL). A negative result must be combined with clinical observations, patient history, and epidemiological information.  Fact Sheet for Patients:   StrictlyIdeas.no  Fact Sheet for Healthcare Providers: BankingDealers.co.za  This test is not yet approved or  cleared by the [place] FDA and has been authorized for detection and/or diagnosis of SARS-CoV-2 by FDA under an Emergency Use Authorization (EUA).  This EUA will remain in effect (meaning this test can be used) for the duration of the COVID-19 declaration under Section 564(b)(1) of the Act, 21 U.S.C. section 360bbb-3(b)(1), unless the authorization is terminated or revoked sooner.  Performed at [facility], [place] [address], [place], [place] [address]   Blood culture (routine x 2)     Status: None (Preliminary result)   Collection Time: 02/10/20  3:36 PM   Specimen: BLOOD  Result Value Ref Range Status   Specimen Description   Final    BLOOD RIGHT ANTECUBITAL Performed at [facility] [address] [place], [place] [address]    Special Requests   Final    BOTTLES DRAWN AEROBIC AND ANAEROBIC Blood Culture results may not be optimal due to an inadequate volume of blood received in culture bottles Performed at [facility] [address], [place], [place] [address]    Culture   Final    NO GROWTH 2 DAYS Performed at [facility] [address] [place], [place] [address]    Report Status PENDING  Incomplete  MRSA PCR Screening     Status: None    Collection Time: 02/12/20 11:18 AM   Specimen: Nasopharyngeal  Result Value Ref Range Status   MRSA by PCR NEGATIVE NEGATIVE Final    Comment:        The GeneXpert MRSA Assay (FDA approved for NASAL specimens only), is one component of a comprehensive MRSA colonization surveillance program. It is not intended to diagnose MRSA infection nor to guide or monitor treatment for MRSA infections. Performed at [facility], [place] [address] [place],  [address]       Radiology Studies: DG Ankle 2 Views Left  Result Date: 02/11/2020 CLINICAL DATA:  Swelling and warmth EXAM: LEFT ANKLE: 2 VIEW COMPARISON:  November 04, 2018 FINDINGS: Frontal and lateral views were obtained. There is marked soft tissue swelling. No fracture or joint effusion. No appreciable joint space narrowing. There is a posterior calcaneal spur. No erosive change or bony destruction. IMPRESSION: Diffuse soft tissue swelling. No fracture or bony destruction. No erosion. No appreciable joint space narrowing. There is a posterior calcaneal spur. Electronically Signed   By: Lowella Grip III M.D.   On: 02/11/2020 10:22   DG Foot 2 Views Left  Result Date: 02/11/2020 CLINICAL DATA:  Soft tissue swelling and warmth EXAM: LEFT FOOT - 2 VIEW COMPARISON:  November 04, 2018 FINDINGS: Frontal and lateral views were obtained. There is marked soft tissue swelling. No evident fracture or dislocation. There is degenerative change with narrowing of the first MTP joint. Other joint spaces appear unremarkable. There is a posterior calcaneal spur. No erosive change or bony destruction. Bones appear somewhat osteoporotic. IMPRESSION: Marked soft tissue swelling. No bony destruction or erosion. Narrowing first MTP joint. Other joint spaces appear unremarkable. There is a posterior calcaneal spur. Electronically  Signed   By: Lowella Grip III M.D.   On: 02/11/2020 10:18   ECHOCARDIOGRAM COMPLETE  Result Date: 02/11/2020     ECHOCARDIOGRAM REPORT   Patient Name:   [name] Date of Exam: 02/11/2020 Medical Rec #:  [id number]     Height:       61.0 in Accession #:    [id number]    Weight:       307.0 lb Date of Birth:  03-14-1956    BSA:          2.266 m Patient Age:    32 years      BP:           164/71 mmHg Patient Gender: F             HR:           123 bpm. Exam Location:  Inpatient Procedure: 2D Echo, Cardiac Doppler, Color Doppler and Intracardiac            Opacification Agent Indications:    Hypertensive emergency  History:        Patient has prior history of Echocardiogram examinations, most                 recent 01/22/2020. Risk Factors:Hypertension and Non-Smoker.                 Breast cancer.  Sonographer:    Vickie Epley RDCS Referring Phys: [id number] [place]  1. Left ventricular ejection fraction, by estimation, is 70 to 75%. The left ventricle has hyperdynamic function. The left ventricle has no regional wall motion abnormalities. Left ventricular diastolic parameters are consistent with Grade I diastolic dysfunction (impaired relaxation). Elevated left ventricular end-diastolic pressure.  2. Right ventricular systolic function is normal. The right ventricular size is normal. Tricuspid regurgitation signal is inadequate for assessing PA pressure.  3. The mitral valve is normal in structure. No evidence of mitral valve regurgitation. No evidence of mitral stenosis.  4. The aortic valve is normal in structure. Aortic valve regurgitation is not visualized. No aortic stenosis is present.  5. The inferior vena cava is normal in size with greater than 50% respiratory variability, suggesting right atrial pressure of 3 mmHg. FINDINGS  Left Ventricle: Left ventricular ejection fraction, by estimation, is 70 to 75%. The left ventricle has hyperdynamic function. The left ventricle has no regional wall motion abnormalities. Definity contrast agent was given IV to delineate the left ventricular endocardial borders.  The left ventricular internal cavity size was normal in size. There is no left ventricular hypertrophy. Left ventricular diastolic parameters are consistent with Grade I diastolic dysfunction (impaired relaxation). Elevated left ventricular end-diastolic pressure. Right Ventricle: The right ventricular size is normal. No increase in right ventricular wall thickness. Right ventricular systolic function is normal. Tricuspid regurgitation signal is inadequate for assessing PA pressure. Left Atrium: Left atrial size was normal in size. Right Atrium: Right atrial size was normal in size. Pericardium: There is no evidence of pericardial effusion. Mitral Valve: The mitral valve is normal in structure. Normal mobility of the mitral valve leaflets. No evidence of mitral valve regurgitation. No evidence of mitral valve stenosis. Tricuspid Valve: The tricuspid valve is normal in structure. Tricuspid valve regurgitation is trivial. No evidence of tricuspid stenosis. Aortic Valve: The aortic valve is normal in structure. Aortic valve regurgitation is not visualized. No aortic stenosis is present. Pulmonic Valve: The pulmonic valve was normal in structure. Pulmonic valve regurgitation is not visualized. No  evidence of pulmonic stenosis. Aorta: The aortic root is normal in size and structure. Venous: The inferior vena cava is normal in size with greater than 50% respiratory variability, suggesting right atrial pressure of 3 mmHg. IAS/Shunts: No atrial level shunt detected by color flow Doppler.  LEFT VENTRICLE PLAX 2D LVIDd:         3.80 cm  Diastology LVIDs:         2.30 cm  LV e' lateral:   8.49 cm/s LV PW:         0.80 cm  LV E/e' lateral: 13.9 LV IVS:        0.80 cm  LV e' medial:    7.18 cm/s LVOT diam:     1.70 cm  LV E/e' medial:  16.4 LV SV:         51 LV SV Index:   23 LVOT Area:     2.27 cm  RIGHT VENTRICLE RV S prime:     16.40 cm/s TAPSE (M-mode): 1.9 cm LEFT ATRIUM             Index       RIGHT ATRIUM           Index  LA diam:        3.30 cm 1.46 cm/m  RA Area:     12.60 cm LA Vol (A2C):   39.3 ml 17.34 ml/m RA Volume:   27.30 ml  12.05 ml/m LA Vol (A4C):   38.0 ml 16.77 ml/m LA Biplane Vol: 41.6 ml 18.36 ml/m  AORTIC VALVE LVOT Vmax:   170.00 cm/s LVOT Vmean:  121.000 cm/s LVOT VTI:    0.225 m  AORTA Ao Root diam: 2.60 cm MITRAL VALVE MV Area (PHT): 5.75 cm     SHUNTS MV Decel Time: 132 msec     Systemic VTI:  0.22 m MV E velocity: 118.00 cm/s  Systemic Diam: 1.70 cm MV A velocity: 159.00 cm/s MV E/A ratio:  0.74 Fransico Him MD Electronically signed by Fransico Him MD Signature Date/Time: 02/11/2020/9:20:01 AM    Final     Scheduled Meds: . aspirin EC  81 mg Oral Daily  . Chlorhexidine Gluconate Cloth  6 each Topical Daily  . colchicine  0.6 mg Oral Daily  . heparin  5,000 Units Subcutaneous Q8H  . hydrALAZINE  25 mg Oral Q8H  . mouth rinse  15 mL Mouth Rinse BID  . metoprolol tartrate  50 mg Oral BID  . predniSONE  40 mg Oral Q breakfast  . sodium chloride flush  10-40 mL Intracatheter Q12H   Continuous Infusions: . cefTRIAXone (ROCEPHIN)  IV Stopped (02/12/20 1044)     LOS: 2 days   Time spent: 25 minutes.  Patrecia Pour, MD [facility] [email or web address] 02/12/2020, 3:37 PM

## 2020-02-13 DIAGNOSIS — L03116 Cellulitis of left lower limb: Secondary | ICD-10-CM

## 2020-02-13 LAB — CBC WITH DIFFERENTIAL/PLATELET
Abs Immature Granulocytes: 0.1 10*3/uL — ABNORMAL HIGH (ref 0.00–0.07)
Basophils Absolute: 0 10*3/uL (ref 0.0–0.1)
Basophils Relative: 1 %
Eosinophils Absolute: 0 10*3/uL (ref 0.0–0.5)
Eosinophils Relative: 0 %
HCT: 18.3 % — ABNORMAL LOW (ref 36.0–46.0)
Hemoglobin: 5.9 g/dL — CL (ref 12.0–15.0)
Immature Granulocytes: 2 %
Lymphocytes Relative: 31 %
Lymphs Abs: 1.4 10*3/uL (ref 0.7–4.0)
MCH: 30.6 pg (ref 26.0–34.0)
MCHC: 32.2 g/dL (ref 30.0–36.0)
MCV: 94.8 fL (ref 80.0–100.0)
Monocytes Absolute: 0.2 10*3/uL (ref 0.1–1.0)
Monocytes Relative: 5 %
Neutro Abs: 2.7 10*3/uL (ref 1.7–7.7)
Neutrophils Relative %: 61 %
Platelets: 138 10*3/uL — ABNORMAL LOW (ref 150–400)
RBC: 1.93 MIL/uL — ABNORMAL LOW (ref 3.87–5.11)
RDW: 14.9 % (ref 11.5–15.5)
WBC: 4.4 10*3/uL (ref 4.0–10.5)
nRBC: 0 % (ref 0.0–0.2)

## 2020-02-13 LAB — COMPREHENSIVE METABOLIC PANEL
ALT: 16 U/L (ref 0–44)
AST: 21 U/L (ref 15–41)
Albumin: 2.4 g/dL — ABNORMAL LOW (ref 3.5–5.0)
Alkaline Phosphatase: 46 U/L (ref 38–126)
Anion gap: 11 (ref 5–15)
BUN: 41 mg/dL — ABNORMAL HIGH (ref 8–23)
CO2: 22 mmol/L (ref 22–32)
Calcium: 8 mg/dL — ABNORMAL LOW (ref 8.9–10.3)
Chloride: 107 mmol/L (ref 98–111)
Creatinine, Ser: 1.12 mg/dL — ABNORMAL HIGH (ref 0.44–1.00)
GFR calc Af Amer: >60 mL/min (ref >60)
GFR calc non Af Amer: 52 mL/min — ABNORMAL LOW (ref >60)
Glucose, Bld: 157 mg/dL — ABNORMAL HIGH (ref 70–99)
Potassium: 3.3 mmol/L — ABNORMAL LOW (ref 3.5–5.1)
Sodium: 140 mmol/L (ref 135–145)
Total Bilirubin: 0.7 mg/dL (ref 0.3–1.2)
Total Protein: 6.2 g/dL — ABNORMAL LOW (ref 6.5–8.1)

## 2020-02-13 LAB — HEMOGLOBIN AND HEMATOCRIT, BLOOD
HCT: 17.2 % — ABNORMAL LOW (ref 36.0–46.0)
HCT: 21.4 % — ABNORMAL LOW (ref 36.0–46.0)
Hemoglobin: 5.6 g/dL — CL (ref 12.0–15.0)
Hemoglobin: 7.1 g/dL — ABNORMAL LOW (ref 12.0–15.0)

## 2020-02-13 LAB — ABO/RH: ABO/RH(D): A POS

## 2020-02-13 LAB — PHOSPHORUS: Phosphorus: 2.2 mg/dL — ABNORMAL LOW (ref 2.5–4.6)

## 2020-02-13 LAB — MAGNESIUM: Magnesium: 1.5 mg/dL — ABNORMAL LOW (ref 1.7–2.4)

## 2020-02-13 LAB — PREPARE RBC (CROSSMATCH)

## 2020-02-13 MED ORDER — FUROSEMIDE 10 MG/ML IJ SOLN
20.0000 mg | Freq: Once | INTRAMUSCULAR | Status: AC
  Administered 2020-02-13: 20 mg via INTRAVENOUS
  Filled 2020-02-13: qty 2

## 2020-02-13 MED ORDER — DIPHENHYDRAMINE HCL 25 MG PO CAPS
25.0000 mg | ORAL_CAPSULE | Freq: Once | ORAL | Status: AC
  Administered 2020-02-13: 25 mg via ORAL
  Filled 2020-02-13: qty 1

## 2020-02-13 MED ORDER — SODIUM CHLORIDE 0.9% IV SOLUTION: Freq: Once | INTRAVENOUS | Status: AC

## 2020-02-13 NOTE — Progress Notes (Signed)
[name]   DOB:1956-02-06   EN#:[id number]   TIR#:[id number]  Subjective:  [name] is currently day 9 cycle 1 of cytoxan + adriamycin (cyclophosphamide + doxorubicin); her counts are excellent. Her admission for what seems to have been left LE / foot gout may be unrelated. This AM she tells me her foot is much better but she has been having diarrhea all night. No family in room   Objective: African American woman exmained in bed Vitals:   02/12/20 2049 02/13/20 0642  BP: 119/65 120/71  Pulse: (!) 103 92  Resp: 18 17  Temp: 98.2 F (36.8 C) 98.3 F (36.8 C)  SpO2: 94% 99%    Body mass index is 58.01 kg/m.  Intake/Output Summary (Last 24 hours) at 02/13/2020 0806 Last data filed at 02/13/2020 0643 Gross per 24 hour  Intake 98.47 ml  Output 1000 ml  Net -901.53 ml    Tolerates manipulation of left foot and toes w/o pain  CBG (last 3)  No results for input(s): GLUCAP in the last 72 hours.   Labs:  Lab Results  Component Value Date   WBC 4.8 02/12/2020   HGB 7.4 (L) 02/12/2020   HCT 22.7 (L) 02/12/2020   MCV 95.8 02/12/2020   PLT 138 (L) 02/12/2020   NEUTROABS 3.7 02/12/2020    '@LASTCHEMISTRY' @  Urine Studies No results for input(s): UHGB, CRYS in the last 72 hours.  Invalid input(s): UACOL, UAPR, USPG, UPH, UTP, UGL, UKET, UBIL, UNIT, UROB, ULEU, UEPI, UWBC, URBC, UBAC, CAST, [place], [place]  Basic Metabolic Panel: Recent Labs  Lab 02/10/20 0853 02/10/20 0853 02/10/20 1536 02/11/20 0340 02/12/20 0500  NA 139  --   --  138 137  K 3.8   < >  --  4.0 3.9  CL 103  --   --  104 105  CO2 23  --   --  22 21*  GLUCOSE 116*  --   --  121* 147*  BUN 24*  --   --  28* 31*  CREATININE 1.21*  --  1.28* 1.24* 1.34*  CALCIUM 7.8*  --   --  7.4* 7.7*  MG  --   --   --  1.1* 1.4*  PHOS  --   --   --  2.9 2.6   < > = values in this interval not displayed.   GFR Estimated Creatinine Clearance: 56.5 mL/min (A) (by C-G formula based on SCr of 1.34 mg/dL (H)). Liver  Function Tests: Recent Labs  Lab 02/10/20 0853 02/11/20 0340 02/12/20 0500  AST 43* 22 19  ALT '29 22 20  ' ALKPHOS 73 60 59  BILITOT 1.0 1.4* 1.1  PROT 7.6 7.1 7.1  ALBUMIN 3.2* 2.8* 2.6*   No results for input(s): LIPASE, AMYLASE in the last 168 hours. No results for input(s): AMMONIA in the last 168 hours. Coagulation profile Recent Labs  Lab 02/10/20 0923  INR 1.2    CBC: Recent Labs  Lab 02/10/20 0853 02/10/20 1536 02/11/20 0340 02/12/20 0500  WBC 24.4* 22.9* 11.6* 4.8  NEUTROABS 17.2*  --  9.5* 3.7  HGB 8.4* 8.5* 7.7* 7.4*  HCT 24.8* 25.9* 24.1* 22.7*  MCV 92.9 95.6 96.4 95.8  PLT 184 175 138* 138*   Cardiac Enzymes: No results for input(s): CKTOTAL, CKMB, CKMBINDEX, TROPONINI in the last 168 hours. BNP: Invalid input(s): POCBNP CBG: No results for input(s): GLUCAP in the last 168 hours. D-Dimer No results for input(s): DDIMER in the last 72 hours. Hgb A1c  Recent Labs    02/10/20 1536  HGBA1C 5.7*   Lipid Profile Recent Labs    02/10/20 1536  CHOL 216*  HDL 100  LDLCALC 96  TRIG 102  CHOLHDL 2.2   Thyroid function studies No results for input(s): TSH, T4TOTAL, T3FREE, THYROIDAB in the last 72 hours.  Invalid input(s): FREET3 Anemia work up No results for input(s): VITAMINB12, FOLATE, FERRITIN, TIBC, IRON, RETICCTPCT in the last 72 hours. Microbiology Recent Results (from the past 240 hour(s))  Blood culture (routine x 2)     Status: None (Preliminary result)   Collection Time: 02/10/20  8:53 AM   Specimen: BLOOD  Result Value Ref Range Status   Specimen Description   Final    BLOOD Performed at [facility] [address], [place], [place] [address]    Special Requests   Final    BOTTLES DRAWN AEROBIC AND ANAEROBIC Blood Culture adequate volume Performed at [facility] [address] [place], [place] [address]    Culture   Final    NO GROWTH 2 DAYS Performed at [facility] [address], [place], [place] [address]    Report Status PENDING  Incomplete  Urine culture     Status: None   Collection Time: 02/10/20  9:23 AM   Specimen: In/Out Cath Urine  Result Value Ref Range Status   Specimen Description   Final    IN/OUT CATH URINE Performed at [facility] [address] [place], [place] [address]    Special Requests   Final    NONE Performed at [facility], [place] [address] [place], [place] [address]    Culture   Final    NO GROWTH Performed at [facility], [place] [address], [place], [place] [address]    Report Status 02/11/2020 FINAL  Final  SARS Coronavirus 2 by RT PCR (hospital order, performed in [facility] hospital lab) Nasopharyngeal Nasopharyngeal Swab     Status: None   Collection Time: 02/10/20 11:57 AM   Specimen: Nasopharyngeal Swab  Result Value Ref Range Status   SARS Coronavirus 2 NEGATIVE NEGATIVE Final    Comment: (NOTE) SARS-CoV-2 target nucleic acids are NOT DETECTED.  The SARS-CoV-2 RNA is generally detectable in upper and lower respiratory specimens during the acute phase of infection. The lowest concentration of SARS-CoV-2 viral copies this assay can detect is 250 copies / mL. A negative result does not preclude SARS-CoV-2 infection and should not be used as the sole basis for treatment or other patient management decisions.  A negative result may occur with improper specimen collection / handling, submission of specimen other than nasopharyngeal swab, presence of viral mutation(s) within the areas targeted by this assay, and inadequate number of viral copies (<250 copies / mL). A negative result must be combined with clinical observations, patient history, and epidemiological information.  Fact Sheet for Patients:   StrictlyIdeas.no  Fact Sheet for Healthcare Providers: BankingDealers.co.za  This test is not yet approved or  cleared by  the [place] FDA and has been authorized for detection and/or diagnosis of SARS-CoV-2 by FDA under an Emergency Use Authorization (EUA).  This EUA will remain in effect (meaning this test can be used) for the duration of the COVID-19 declaration under Section 564(b)(1) of the Act, 21 U.S.C. section 360bbb-3(b)(1), unless the authorization is terminated or revoked sooner.  Performed at [facility], [place] [address], [place], [place] [address]  Blood culture (routine x 2)     Status: None (Preliminary result)   Collection Time: 02/10/20  3:36 PM   Specimen: BLOOD  Result Value Ref Range Status   Specimen Description   Final    BLOOD RIGHT ANTECUBITAL Performed at [facility] [address], [place], [place] [address]    Special Requests   Final    BOTTLES DRAWN AEROBIC AND ANAEROBIC Blood Culture results may not be optimal due to an inadequate volume of blood received in culture bottles Performed at [facility] [address], [place], [place] [address]    Culture   Final    NO GROWTH 2 DAYS Performed at [facility] [address], [place], [place] [address]    Report Status PENDING  Incomplete  MRSA PCR Screening     Status: None   Collection Time: 02/12/20 11:18 AM   Specimen: Nasopharyngeal  Result Value Ref Range Status   MRSA by PCR NEGATIVE NEGATIVE Final    Comment:        The GeneXpert MRSA Assay (FDA approved for NASAL specimens only), is one component of a comprehensive MRSA colonization surveillance program. It is not intended to diagnose MRSA infection nor to guide or monitor treatment for MRSA infections. Performed at [facility], [place] [address] [place], [place] [address]       Studies:  DG Ankle 2 Views Left  Result Date: 02/11/2020 CLINICAL DATA:  Swelling and warmth EXAM: LEFT ANKLE: 2 VIEW COMPARISON:  November 04, 2018 FINDINGS: Frontal and lateral views were  obtained. There is marked soft tissue swelling. No fracture or joint effusion. No appreciable joint space narrowing. There is a posterior calcaneal spur. No erosive change or bony destruction. IMPRESSION: Diffuse soft tissue swelling. No fracture or bony destruction. No erosion. No appreciable joint space narrowing. There is a posterior calcaneal spur. Electronically Signed   By: Lowella Grip III M.D.   On: 02/11/2020 10:22   DG Foot 2 Views Left  Result Date: 02/11/2020 CLINICAL DATA:  Soft tissue swelling and warmth EXAM: LEFT FOOT - 2 VIEW COMPARISON:  November 04, 2018 FINDINGS: Frontal and lateral views were obtained. There is marked soft tissue swelling. No evident fracture or dislocation. There is degenerative change with narrowing of the first MTP joint. Other joint spaces appear unremarkable. There is a posterior calcaneal spur. No erosive change or bony destruction. Bones appear somewhat osteoporotic. IMPRESSION: Marked soft tissue swelling. No bony destruction or erosion. Narrowing first MTP joint. Other joint spaces appear unremarkable. There is a posterior calcaneal spur. Electronically Signed   By: Lowella Grip III M.D.   On: 02/11/2020 10:18   ECHOCARDIOGRAM COMPLETE  Result Date: 02/11/2020    ECHOCARDIOGRAM REPORT   Patient Name:   [name] Date of Exam: 02/11/2020 Medical Rec #:  [id number]     Height:       61.0 in Accession #:    [id number]    Weight:       307.0 lb Date of Birth:  Aug 07, 1956    BSA:          2.266 m Patient Age:    72 years      BP:           164/71 mmHg Patient Gender: F             HR:           123 bpm. Exam Location:  Inpatient  Procedure: 2D Echo, Cardiac Doppler, Color Doppler and Intracardiac            Opacification Agent Indications:    Hypertensive emergency  History:        Patient has prior history of Echocardiogram examinations, most                 recent 01/22/2020. Risk Factors:Hypertension and Non-Smoker.                 Breast cancer.   Sonographer:    Vickie Epley RDCS Referring Phys: [id number] [place]  1. Left ventricular ejection fraction, by estimation, is 70 to 75%. The left ventricle has hyperdynamic function. The left ventricle has no regional wall motion abnormalities. Left ventricular diastolic parameters are consistent with Grade I diastolic dysfunction (impaired relaxation). Elevated left ventricular end-diastolic pressure.  2. Right ventricular systolic function is normal. The right ventricular size is normal. Tricuspid regurgitation signal is inadequate for assessing PA pressure.  3. The mitral valve is normal in structure. No evidence of mitral valve regurgitation. No evidence of mitral stenosis.  4. The aortic valve is normal in structure. Aortic valve regurgitation is not visualized. No aortic stenosis is present.  5. The inferior vena cava is normal in size with greater than 50% respiratory variability, suggesting right atrial pressure of 3 mmHg. FINDINGS  Left Ventricle: Left ventricular ejection fraction, by estimation, is 70 to 75%. The left ventricle has hyperdynamic function. The left ventricle has no regional wall motion abnormalities. Definity contrast agent was given IV to delineate the left ventricular endocardial borders. The left ventricular internal cavity size was normal in size. There is no left ventricular hypertrophy. Left ventricular diastolic parameters are consistent with Grade I diastolic dysfunction (impaired relaxation). Elevated left ventricular end-diastolic pressure. Right Ventricle: The right ventricular size is normal. No increase in right ventricular wall thickness. Right ventricular systolic function is normal. Tricuspid regurgitation signal is inadequate for assessing PA pressure. Left Atrium: Left atrial size was normal in size. Right Atrium: Right atrial size was normal in size. Pericardium: There is no evidence of pericardial effusion. Mitral Valve: The mitral valve is normal in  structure. Normal mobility of the mitral valve leaflets. No evidence of mitral valve regurgitation. No evidence of mitral valve stenosis. Tricuspid Valve: The tricuspid valve is normal in structure. Tricuspid valve regurgitation is trivial. No evidence of tricuspid stenosis. Aortic Valve: The aortic valve is normal in structure. Aortic valve regurgitation is not visualized. No aortic stenosis is present. Pulmonic Valve: The pulmonic valve was normal in structure. Pulmonic valve regurgitation is not visualized. No evidence of pulmonic stenosis. Aorta: The aortic root is normal in size and structure. Venous: The inferior vena cava is normal in size with greater than 50% respiratory variability, suggesting right atrial pressure of 3 mmHg. IAS/Shunts: No atrial level shunt detected by color flow Doppler.  LEFT VENTRICLE PLAX 2D LVIDd:         3.80 cm  Diastology LVIDs:         2.30 cm  LV e' lateral:   8.49 cm/s LV PW:         0.80 cm  LV E/e' lateral: 13.9 LV IVS:        0.80 cm  LV e' medial:    7.18 cm/s LVOT diam:     1.70 cm  LV E/e' medial:  16.4 LV SV:         51 LV SV Index:   23 LVOT  Area:     2.27 cm  RIGHT VENTRICLE RV S prime:     16.40 cm/s TAPSE (M-mode): 1.9 cm LEFT ATRIUM             Index       RIGHT ATRIUM           Index LA diam:        3.30 cm 1.46 cm/m  RA Area:     12.60 cm LA Vol (A2C):   39.3 ml 17.34 ml/m RA Volume:   27.30 ml  12.05 ml/m LA Vol (A4C):   38.0 ml 16.77 ml/m LA Biplane Vol: 41.6 ml 18.36 ml/m  AORTIC VALVE LVOT Vmax:   170.00 cm/s LVOT Vmean:  121.000 cm/s LVOT VTI:    0.225 m  AORTA Ao Root diam: 2.60 cm MITRAL VALVE MV Area (PHT): 5.75 cm     SHUNTS MV Decel Time: 132 msec     Systemic VTI:  0.22 m MV E velocity: 118.00 cm/s  Systemic Diam: 1.70 cm MV A velocity: 159.00 cm/s MV E/A ratio:  0.74 Fransico Him MD Electronically signed by Fransico Him MD Signature Date/Time: 02/11/2020/9:20:01 AM    Final     Assessment: 64 y.o. [place] woman status post left breast  upper outer quadrant and left axillary lymph node biopsies 11/12/2019 for a clinical T2 N2, stage IIIA invasive ductal carcinoma, grade 3, estrogen and progesterone receptor positive, HER-2 not amplified, with an MIB-1 of 70%             (a) the biopsied axillary lymph node was positive  (1) genetics testing 11/29/2019 through the Breast Cancer STAT Panel offered by [facility] found no deleterious mutations in ATM, BRCA1, BRCA2, CDH1, CHEK2, PALB2, PTEN, STK11 and TP53.  Additional testing through the Common Hereditary Cancers Panel confirmed no deleterious mutations in APC, ATM, AXIN2, BARD1, BMPR1A, BRCA1, BRCA2, BRIP1, CDH1, CDK4, CDKN2A (p14ARF), CDKN2A (p16INK4a), CHEK2, CTNNA1, DICER1, EPCAM (Deletion/duplication testing only), GREM1 (promoter region deletion/duplication testing only), KIT, MEN1, MLH1, MSH2, MSH3, MSH6, MUTYH, NBN, NF1, NHTL1, PALB2, PDGFRA, PMS2, POLD1, POLE, PTEN, RAD50, RAD51C, RAD51D, RNF43, SDHB, SDHC, SDHD, SMAD4, SMARCA4. STK11, TP53, TSC1, TSC2, and VHL.  The following genes were evaluated for sequence changes only: SDHA and HOXB13 c.251G>A variant only.  (2) status post left modified radical mastectomy 12/19/2019 for a pT2 pN1, stage IIB invasive ductal carcinoma, grade 3, with negative margins.             (a) 1 of 13 axillary lymph nodes removed was positive  (3) adjuvant chemotherapy will consisting of cyclophosphamide and doxorubicin x4 started 06/08/2021to be followed by weekly paclitaxel x12             (a) baseline echocardiogram 11/26/2019 shows an ejection fraction in the 65-70% range.             (b) first cycle of cyclophosphamide and doxorubicin dose reduced to assess tolerance  (4) adjuvant radiation as appropriate  (5) antiestrogens to follow  Plan:  Zykiria tolerated her first dose-reduced chemotherapy cycle well in terms of nause, fatigue, counts and similar issues. I am not sure the admitting problem is causally related, it is certainly temporally  related, and I think it may be prudent to wait untilo 6/29 (instead of 6/22) for her 2d cycle of chemotherapy-- will enter orders accordingly.  Her current diarrhea may be due to colchicine. Would add allopurinol at some point and continue at discharge to prevent repeat events with the next chemo  cycles.  Will follow peripherally. Appreciate your help to this patient!   Chauncey Cruel, MD 02/13/2020  8:06 AM Medical Oncology and Hematology [facility] [address] [place], [place] [address] Tel. [phone]    Fax. [phone]

## 2020-02-13 NOTE — Progress Notes (Signed)
PROGRESS NOTE  Akshara Blumenthal  WFU:[id number] DOB: Jan 16, 1956 DOA: 02/10/2020 PCP: [name], MD  Outpatient Specialists: Oncology, Dr. Jana Hakim Brief Narrative:  64 y.o. female left breast CA Dx April 2021 s/p mastectomy and undergoing chemotherapy, lymphedema, poor wound healing, venous insufficiency, stage III CKD with previous acute renal failure, EtOH  Came to [facility] 6/13 with left leg/foot swelling and pain beginning in the ankle and extending proximally over the course of 1 day.  Exam =cellulitis versus DVT.   U/S was negative for DVT.  WBC 24.4k, SCr near baseline at 1.21, hgb near baseline at 8.4g/dl.  Vancomycin and ceftriaxone were given for cellulitis. Code sepsis was called due to severe tachycardia, leukocytosis. BP was elevated in the setting of being taken of antihypertensives and nitroglycerin infusion was started, patient admitted to SDU.  Assessment & Plan: Principal Problem:   Cellulitis of left lower extremity without foot Active Problems:   Essential hypertension   Alcohol-induced mood disorder (HCC)   Alcohol dependence (HCC)   Alcoholic hepatitis   Malignant neoplasm of upper-outer quadrant of left breast in female, estrogen receptor positive ([place])   Morbid obesity with BMI of 60.0-69.9, adult (HCC)   S/P left mastectomy   Hypertensive emergency   Cellulitis of left lower extremity  LLE cellulitis: Complicated by lymphedema, obesity, venous insufficiency. Pt is not diabetic. No DVT on venous U/S.  recently  ABI was 1.07. No evidence of wound that is reported to have healed 6/4 on exam. XR not showing erosive changes.  - Narrow abx to ceftriaxone to off [rx 4 days]. ddx for  leukocytosis on admission was related to GCSF on 6/10, resolved. - Monitor blood cultures (NG ~48 h] - Pain control with oxycodone prn  Suspected acute gout flare: Abrupt onset of pain and prompt response to steroid suggests thi Dx with elevated uric acid (9.1). - Continue steroid,  deescalate to prednisone 40mg -->20 mg in 1-2 days - colchicine caused severe diarrh overnight--will seehow she does tomorrow--if worse, change ot indocin  HTN urgency: Echo with G1DD, LVEF 70%, IVC appeared normal. - Titrated up on beta blocker, will switch metoprolol > coreg for additional alpha blockade to assist with BP lowering - Added hydralazine.  - Will avoid diuretics and RAAS agents for now.  Left breast cancer s/p mastectomy now on adjuvant chemo (cyclophosphamide and doxorubicin), last dose 02/07/2020.  - Appreciate Dr. Jana Hakim input  Anemia: Likely related to chronic disease/malignancy and chemotherapy. - Transfusiing for Hb 5.6 -rpt labs am  Stage IIIa CKD:  - SCr at baseline, will avoid nephrotoxins. Will not restart ARB at this time.   Marijuana use: +THC on UDS. Hx EtOH abuse per report, though not currently and negative level at admission. - Cessation counseling provided.   Anxiety:  - Continue prn  Hypomagnesemia:  - Supplement again today and monitor.   Alcohol use, hepatitis: Does not appear to be active issue.  Obesity: Estimated body mass index is 58.01 kg/m as calculated from the following:   Height as of this encounter: 5\' 1"  (1.549 m).   Weight as of this encounter: 139.3 kg.  DVT prophylaxis: Heparin Code Status: DNR Family Communication: None at bedside Disposition Plan:  Status is: Inpatient. PT/OT ordered.   Remains inpatient appropriate because:IV treatments appropriate due to intensity of illness or inability to take PO. Pt's HTN urgency is resolving, still titrating medications for this. Pain is improved though she's no where near her functional baseline.   Dispo: The patient is from: Home  Anticipated d/c is to: TBD              Anticipated d/c date is: 1 day              Patient currently is not medically stable to d/c.  Consultants:   None  Procedures:   None  Antimicrobials:  Vancomycin, zosyn 6/13 -  6/15  Ceftriaxone 6/15 >>   Subjective: Well-pain in LLE improved some ~4 episodes diarr yesterday No cp no fever No dark stool eating ok   Objective: Vitals:   02/13/20 0642 02/13/20 1235 02/13/20 1400 02/13/20 1420  BP: 120/71 (!) 146/63 140/73   Pulse: 92 93 (!) 105 (!) 107  Resp: 17 20 20 20   Temp: 98.3 F (36.8 C) 98 F (36.7 C) 99 F (37.2 C) 99.6 F (37.6 C)  TempSrc: Oral Oral Oral Oral  SpO2: 99% 98% 99% 99%  Weight:      Height:        Intake/Output Summary (Last 24 hours) at 02/13/2020 1526 Last data filed at 02/13/2020 1400 Gross per 24 hour  Intake 348.47 ml  Output 300 ml  Net 48.47 ml   Filed Weights   02/10/20 0751  Weight: (!) 139.3 kg   Gen: 64 y.o. female in no distress Pulm: Nonlabored breathing room air. Clear. CV: Regular rate and rhythm. No murmur, rub, or gallop. No JVD, no dependent edema. GI: Abdomen soft, non-tender, non-distended, with normoactive bowel sounds.  Ext: Bilateral LE's with irregular hyperpigmentation in lower legs, no hair growth. LLE without evidence of wound, though diffuse mildy tender in LLE and it still feels wamr. Tenderness with movement of the ankle and toes. No focal erythema overlaying a joint, but diffusely erythematous. No fluctuance. Unchanged. Skin: No rashes, lesions or ulcers on visualized skin. Neuro: Alert and oriented. No focal neurological deficits. Psych: Judgement and insight appear fair. Mood euthymic & affect congruent. Behavior is appropriate.    Data Reviewed: I have personally reviewed following labs and imaging studies  CBC: Recent Labs  Lab 02/10/20 0853 02/10/20 0853 02/10/20 1536 02/11/20 0340 02/12/20 0500 02/13/20 0940 02/13/20 1114  WBC 24.4*  --  22.9* 11.6* 4.8 4.4  --   NEUTROABS 17.2*  --   --  9.5* 3.7 2.7  --   HGB 8.4*   < > 8.5* 7.7* 7.4* 5.9* 5.6*  HCT 24.8*   < > 25.9* 24.1* 22.7* 18.3* 17.2*  MCV 92.9  --  95.6 96.4 95.8 94.8  --   PLT 184  --  175 138* 138* 138*  --     < > = values in this interval not displayed.   Basic Metabolic Panel: Recent Labs  Lab 02/10/20 0853 02/10/20 1536 02/11/20 0340 02/12/20 0500 02/13/20 0940  NA 139  --  138 137 140  K 3.8  --  4.0 3.9 3.3*  CL 103  --  104 105 107  CO2 23  --  22 21* 22  GLUCOSE 116*  --  121* 147* 157*  BUN 24*  --  28* 31* 41*  CREATININE 1.21* 1.28* 1.24* 1.34* 1.12*  CALCIUM 7.8*  --  7.4* 7.7* 8.0*  MG  --   --  1.1* 1.4* 1.5*  PHOS  --   --  2.9 2.6 2.2*   GFR: Estimated Creatinine Clearance: 67.6 mL/min (A) (by C-G formula based on SCr of 1.12 mg/dL (H)). Liver Function Tests: Recent Labs  Lab 02/10/20 0853 02/11/20 0340 02/12/20 0500 02/13/20 0940  AST 43* 22 19 21   ALT 29 22 20 16   ALKPHOS 73 60 59 46  BILITOT 1.0 1.4* 1.1 0.7  PROT 7.6 7.1 7.1 6.2*  ALBUMIN 3.2* 2.8* 2.6* 2.4*   No results for input(s): LIPASE, AMYLASE in the last 168 hours. No results for input(s): AMMONIA in the last 168 hours. Coagulation Profile: Recent Labs  Lab 02/10/20 0923  INR 1.2   Cardiac Enzymes: No results for input(s): CKTOTAL, CKMB, CKMBINDEX, TROPONINI in the last 168 hours. BNP (last 3 results) No results for input(s): PROBNP in the last 8760 hours. HbA1C: Recent Labs    02/10/20 1536  HGBA1C 5.7*   CBG: No results for input(s): GLUCAP in the last 168 hours. Lipid Profile: Recent Labs    02/10/20 1536  CHOL 216*  HDL 100  LDLCALC 96  TRIG 102  CHOLHDL 2.2   Thyroid Function Tests: No results for input(s): TSH, T4TOTAL, FREET4, T3FREE, THYROIDAB in the last 72 hours. Anemia Panel: No results for input(s): VITAMINB12, FOLATE, FERRITIN, TIBC, IRON, RETICCTPCT in the last 72 hours. Urine analysis:    Component Value Date/Time   COLORURINE YELLOW 02/10/2020 1427   APPEARANCEUR CLEAR 02/10/2020 1427   LABSPEC 1.012 02/10/2020 1427   PHURINE 5.0 02/10/2020 1427   GLUCOSEU NEGATIVE 02/10/2020 1427   HGBUR SMALL (A) 02/10/2020 1427   BILIRUBINUR NEGATIVE 02/10/2020  1427   KETONESUR NEGATIVE 02/10/2020 1427   PROTEINUR NEGATIVE 02/10/2020 1427   UROBILINOGEN 1.0 07/14/2012 1630   NITRITE NEGATIVE 02/10/2020 1427   LEUKOCYTESUR NEGATIVE 02/10/2020 1427   Recent Results (from the past 240 hour(s))  Blood culture (routine x 2)     Status: None (Preliminary result)   Collection Time: 02/10/20  8:53 AM   Specimen: BLOOD  Result Value Ref Range Status   Specimen Description   Final    BLOOD Performed at [facility], [place] [address], [place], [place] [address]    Special Requests   Final    BOTTLES DRAWN AEROBIC AND ANAEROBIC Blood Culture adequate volume Performed at [facility] [address], [place], [place] [address]    Culture   Final    NO GROWTH 3 DAYS Performed at [facility], [place] [address], [place], [place] [address]    Report Status PENDING  Incomplete  Urine culture     Status: None   Collection Time: 02/10/20  9:23 AM   Specimen: In/Out Cath Urine  Result Value Ref Range Status   Specimen Description   Final    IN/OUT CATH URINE Performed at [facility] [address] [place], [place] [address]    Special Requests   Final    NONE Performed at [facility], [place] [address] [place], [place] [address]    Culture   Final    NO GROWTH Performed at [facility], [place] [address] [place], [place] [address]    Report Status 02/11/2020 FINAL  Final  SARS Coronavirus 2 by RT PCR (hospital order, performed in [facility] hospital lab) Nasopharyngeal Nasopharyngeal Swab     Status: None   Collection Time: 02/10/20 11:57 AM   Specimen: Nasopharyngeal Swab  Result Value Ref Range Status   SARS Coronavirus 2 NEGATIVE NEGATIVE Final    Comment: (NOTE) SARS-CoV-2 target nucleic acids are NOT DETECTED.  The SARS-CoV-2 RNA is generally detectable in upper and lower respiratory specimens during the acute phase of infection. The lowest concentration  of SARS-CoV-2  viral copies this assay can detect is 250 copies / mL. A negative result does not preclude SARS-CoV-2 infection and should not be used as the sole basis for treatment or other patient management decisions.  A negative result may occur with improper specimen collection / handling, submission of specimen other than nasopharyngeal swab, presence of viral mutation(s) within the areas targeted by this assay, and inadequate number of viral copies (<250 copies / mL). A negative result must be combined with clinical observations, patient history, and epidemiological information.  Fact Sheet for Patients:   StrictlyIdeas.no  Fact Sheet for Healthcare Providers: BankingDealers.co.za  This test is not yet approved or  cleared by the [place] FDA and has been authorized for detection and/or diagnosis of SARS-CoV-2 by FDA under an Emergency Use Authorization (EUA).  This EUA will remain in effect (meaning this test can be used) for the duration of the COVID-19 declaration under Section 564(b)(1) of the Act, 21 U.S.C. section 360bbb-3(b)(1), unless the authorization is terminated or revoked sooner.  Performed at [facility], [place] [address], [place], [place] [address]   Blood culture (routine x 2)     Status: None (Preliminary result)   Collection Time: 02/10/20  3:36 PM   Specimen: BLOOD  Result Value Ref Range Status   Specimen Description   Final    BLOOD RIGHT ANTECUBITAL Performed at [facility] [address] [place], [place] [address]    Special Requests   Final    BOTTLES DRAWN AEROBIC AND ANAEROBIC Blood Culture results may not be optimal due to an inadequate volume of blood received in culture bottles Performed at [facility] [address] [place], [place] [address]    Culture   Final    NO GROWTH 3 DAYS Performed at [facility], [place] [address], [place], [place] [address]    Report Status PENDING  Incomplete  MRSA PCR Screening     Status: None   Collection Time: 02/12/20 11:18 AM   Specimen: Nasopharyngeal  Result Value Ref Range Status   MRSA by PCR NEGATIVE NEGATIVE Final    Comment:        The GeneXpert MRSA Assay (FDA approved for NASAL specimens only), is one component of a comprehensive MRSA colonization surveillance program. It is not intended to diagnose MRSA infection nor to guide or monitor treatment for MRSA infections. Performed at [facility], [place] [address] [place], [place] [address]       Radiology Studies: No results found.  Scheduled Meds: . sodium chloride  250 mL Intravenous Once  . aspirin EC  81 mg Oral Daily  . carvedilol  12.5 mg Oral BID WC  . Chlorhexidine Gluconate Cloth  6 each Topical Daily  . [START ON 02/14/2020] colchicine  0.3 mg Oral QODAY  . furosemide  20 mg Intravenous Once  . heparin  5,000 Units Subcutaneous Q8H  . hydrALAZINE  25 mg Oral Q8H  . predniSONE  40 mg Oral Q breakfast  . sodium chloride flush  10-40 mL Intracatheter Q12H   Continuous Infusions:    LOS: 3 days   Time spent: 25 minutes.  [name], MD [facility] [email or web address] 02/13/2020, 3:26 PM

## 2020-02-13 NOTE — Progress Notes (Addendum)
CRITICAL VALUE ALERT  Critical Value:  Hgb 5.9  Date & Time Notied:  02/13/20  Provider Notified: Dr Verlon Au  Orders Received/Actions taken: will page, await orders ORDER to type & screen, repeat H&H, [place] PRBC, repeat H&H

## 2020-02-13 NOTE — Progress Notes (Signed)
Pharmacist Chemotherapy Monitoring - Follow Up Assessment    I verify that I have reviewed each item in the below checklist:   Regimen for the patient is scheduled for the appropriate day and plan matches scheduled date.  Appropriate non-routine labs are ordered dependent on drug ordered.  If applicable, additional medications reviewed and ordered per protocol based on lifetime cumulative doses and/or treatment regimen.   Plan for follow-up and/or issues identified: Yes  I-vent associated with next due treatment: Yes  MD and/or nursing notified: Yes  Kennith Center, Pharm.D., CPP 02/13/2020@3 :05 PM

## 2020-02-14 ENCOUNTER — Telehealth: Payer: Self-pay | Admitting: Oncology

## 2020-02-14 LAB — CBC WITH DIFFERENTIAL/PLATELET
Abs Immature Granulocytes: 0.49 10*3/uL — ABNORMAL HIGH (ref 0.00–0.07)
Basophils Absolute: 0 10*3/uL (ref 0.0–0.1)
Basophils Relative: 1 %
Eosinophils Absolute: 0 10*3/uL (ref 0.0–0.5)
Eosinophils Relative: 0 %
HCT: 20.2 % — ABNORMAL LOW (ref 36.0–46.0)
Hemoglobin: 6.7 g/dL — CL (ref 12.0–15.0)
Immature Granulocytes: 10 %
Lymphocytes Relative: 35 %
Lymphs Abs: 1.7 10*3/uL (ref 0.7–4.0)
MCH: 31.2 pg (ref 26.0–34.0)
MCHC: 33.2 g/dL (ref 30.0–36.0)
MCV: 94 fL (ref 80.0–100.0)
Monocytes Absolute: 0.3 10*3/uL (ref 0.1–1.0)
Monocytes Relative: 6 %
Neutro Abs: 2.4 10*3/uL (ref 1.7–7.7)
Neutrophils Relative %: 48 %
Platelets: 125 10*3/uL — ABNORMAL LOW (ref 150–400)
RBC: 2.15 MIL/uL — ABNORMAL LOW (ref 3.87–5.11)
RDW: 14.4 % (ref 11.5–15.5)
WBC: 4.9 10*3/uL (ref 4.0–10.5)
nRBC: 0 % (ref 0.0–0.2)

## 2020-02-14 LAB — TYPE AND SCREEN
ABO/RH(D): A POS
Antibody Screen: NEGATIVE
Unit division: 0

## 2020-02-14 LAB — COMPREHENSIVE METABOLIC PANEL
ALT: 17 U/L (ref 0–44)
AST: 23 U/L (ref 15–41)
Albumin: 2.4 g/dL — ABNORMAL LOW (ref 3.5–5.0)
Alkaline Phosphatase: 43 U/L (ref 38–126)
Anion gap: 12 (ref 5–15)
BUN: 32 mg/dL — ABNORMAL HIGH (ref 8–23)
CO2: 21 mmol/L — ABNORMAL LOW (ref 22–32)
Calcium: 7.8 mg/dL — ABNORMAL LOW (ref 8.9–10.3)
Chloride: 103 mmol/L (ref 98–111)
Creatinine, Ser: 1.06 mg/dL — ABNORMAL HIGH (ref 0.44–1.00)
GFR calc Af Amer: >60 mL/min (ref >60)
GFR calc non Af Amer: 55 mL/min — ABNORMAL LOW (ref >60)
Glucose, Bld: 102 mg/dL — ABNORMAL HIGH (ref 70–99)
Potassium: 3.4 mmol/L — ABNORMAL LOW (ref 3.5–5.1)
Sodium: 136 mmol/L (ref 135–145)
Total Bilirubin: 0.7 mg/dL (ref 0.3–1.2)
Total Protein: 6 g/dL — ABNORMAL LOW (ref 6.5–8.1)

## 2020-02-14 LAB — BPAM RBC
Blood Product Expiration Date: 202107012359
ISSUE DATE / TIME: 202106161353
Unit Type and Rh: 6200

## 2020-02-14 LAB — PHOSPHORUS: Phosphorus: 2.8 mg/dL (ref 2.5–4.6)

## 2020-02-14 LAB — MAGNESIUM: Magnesium: 1.3 mg/dL — ABNORMAL LOW (ref 1.7–2.4)

## 2020-02-14 MED ORDER — HYDRALAZINE HCL 25 MG PO TABS: 25.0000 mg | ORAL_TABLET | Freq: Three times a day (TID) | ORAL | 0 refills | Status: DC

## 2020-02-14 MED ORDER — OXYCODONE HCL 10 MG PO TABS: 10.0000 mg | ORAL_TABLET | ORAL | 0 refills | Status: AC | PRN

## 2020-02-14 MED ORDER — NON FORMULARY: 1.0000 | Freq: Every day | Status: DC

## 2020-02-14 MED ORDER — CARVEDILOL 12.5 MG PO TABS: 12.5000 mg | ORAL_TABLET | Freq: Two times a day (BID) | ORAL | 3 refills | Status: DC

## 2020-02-14 MED ORDER — ASPIRIN 81 MG PO TBEC: 81.0000 mg | DELAYED_RELEASE_TABLET | Freq: Every day | ORAL | 11 refills | Status: DC

## 2020-02-14 MED ORDER — COLCHICINE 0.6 MG PO TABS: 0.3000 mg | ORAL_TABLET | ORAL | 0 refills | Status: DC

## 2020-02-14 NOTE — Progress Notes (Signed)
Pt discharged home with family in stable condition. Discharge instructions given. Scripts sent to pharmacy of choice. No immediate questions or concerns at this time. Pt discharged from unit via wheelchair.

## 2020-02-14 NOTE — Progress Notes (Cosign Needed)
    Durable Medical Equipment  (From admission, onward)         Start     Ordered   02/14/20 1128  For home use only DME Hospital bed  Once       Question Answer Comment  Length of Need Lifetime   The above medical condition requires: Patient requires the ability to reposition frequently   Head must be elevated greater than: 45 degrees   Bed type Semi-electric   Trapeze Bar Yes   Support Surface: Gel Overlay      02/14/20 1127

## 2020-02-14 NOTE — Progress Notes (Signed)
[name]   DOB:1956-07-25   UX#:[id number]   OZD#:[id number]  Subjective:  [name] is currently day 10 cycle 1 of cytoxan + adriamycin (cyclophosphamide + doxorubicin); she has had very good nadir counts (this was a dose reduced cycle) and has had no significant nausea vomiting mouth sores or other side effects or complications directly related to her treatment.  She tells me her left foot feels much better and she let the nurse rub it with some liniment today.  [name] is requesting a wheelchair, walker, and hospital bed to have at home and is making some arrangements with her family so she will have some help there.  She is hoping to be discharged today.  Objective: African American woman exmained in bed Vitals:   02/13/20 2319 02/14/20 0539  BP: 140/76 138/66  Pulse: 89 92  Resp: 18 17  Temp: 97.8 F (36.6 C) 97.9 F (36.6 C)  SpO2: 99% 99%    Body mass index is 58.01 kg/m.  Intake/Output Summary (Last 24 hours) at 02/14/2020 0922 Last data filed at 02/13/2020 1700 Gross per 24 hour  Intake 630 ml  Output 800 ml  Net -170 ml    No significant pain to palpation of the left foot, which has no erythema  CBG (last 3)  No results for input(s): GLUCAP in the last 72 hours.   Labs:  Lab Results  Component Value Date   WBC 4.9 02/14/2020   HGB 6.7 (LL) 02/14/2020   HCT 20.2 (L) 02/14/2020   MCV 94.0 02/14/2020   PLT 125 (L) 02/14/2020   NEUTROABS 2.4 02/14/2020    _0 @  Urine Studies No results for input(s): UHGB, CRYS in the last 72 hours.  Invalid input(s): UACOL, UAPR, USPG, UPH, UTP, UGL, UKET, UBIL, UNIT, UROB, ULEU, UEPI, UWBC, URBC, UBAC, CAST, [place], [place]  Basic Metabolic Panel: Recent Labs  Lab 02/10/20 0853 02/10/20 0853 02/10/20 1536 02/11/20 0340 02/11/20 0340 02/12/20 0500 02/12/20 0500 02/13/20 0940 02/14/20 0632  NA 139  --   --  138  --  137  --  140 136  K 3.8   < >  --  4.0   < > 3.9   < > 3.3* 3.4*  CL 103  --   --  104  --   105  --  107 103  CO2 23  --   --  22  --  21*  --  22 21*  GLUCOSE 116*  --   --  121*  --  147*  --  157* 102*  BUN 24*  --   --  28*  --  31*  --  41* 32*  CREATININE 1.21*   < > 1.28* 1.24*  --  1.34*  --  1.12* 1.06*  CALCIUM 7.8*  --   --  7.4*  --  7.7*  --  8.0* 7.8*  MG  --   --   --  1.1*  --  1.4*  --  1.5* 1.3*  PHOS  --   --   --  2.9  --  2.6  --  2.2* 2.8   < > = values in this interval not displayed.   GFR Estimated Creatinine Clearance: 71.4 mL/min (A) (by C-G formula based on SCr of 1.06 mg/dL (H)). Liver Function Tests: Recent Labs  Lab 02/10/20 0853 02/11/20 0340 02/12/20 0500 02/13/20 0940 02/14/20 0632  AST 43* _1 ALT _2 ALKPHOS 73  60 59 46 43  BILITOT 1.0 1.4* 1.1 0.7 0.7  PROT 7.6 7.1 7.1 6.2* 6.0*  ALBUMIN 3.2* 2.8* 2.6* 2.4* 2.4*   No results for input(s): LIPASE, AMYLASE in the last 168 hours. No results for input(s): AMMONIA in the last 168 hours. Coagulation profile Recent Labs  Lab 02/10/20 0923  INR 1.2    CBC: Recent Labs  Lab 02/10/20 0853 02/10/20 0853 02/10/20 1536 02/10/20 1536 02/11/20 0340 02/11/20 0340 02/12/20 0500 02/13/20 0940 02/13/20 1114 02/13/20 2021 02/14/20 0632  WBC 24.4*   < > 22.9*  --  11.6*  --  4.8 4.4  --   --  4.9  NEUTROABS 17.2*  --   --   --  9.5*  --  3.7 2.7  --   --  2.4  HGB 8.4*   < > 8.5*   < > 7.7*   < > 7.4* 5.9* 5.6* 7.1* 6.7*  HCT 24.8*   < > 25.9*   < > 24.1*   < > 22.7* 18.3* 17.2* 21.4* 20.2*  MCV 92.9   < > 95.6  --  96.4  --  95.8 94.8  --   --  94.0  PLT 184   < > 175  --  138*  --  138* 138*  --   --  125*   < > = values in this interval not displayed.   Cardiac Enzymes: No results for input(s): CKTOTAL, CKMB, CKMBINDEX, TROPONINI in the last 168 hours. BNP: Invalid input(s): POCBNP CBG: No results for input(s): GLUCAP in the last 168 hours. D-Dimer No results for input(s): DDIMER in the last 72 hours. Hgb A1c No results for input(s): HGBA1C in the last  72 hours. Lipid Profile No results for input(s): CHOL, HDL, LDLCALC, TRIG, CHOLHDL, LDLDIRECT in the last 72 hours. Thyroid function studies No results for input(s): TSH, T4TOTAL, T3FREE, THYROIDAB in the last 72 hours.  Invalid input(s): FREET3 Anemia work up No results for input(s): VITAMINB12, FOLATE, FERRITIN, TIBC, IRON, RETICCTPCT in the last 72 hours. Microbiology Recent Results (from the past 240 hour(s))  Blood culture (routine x 2)     Status: None (Preliminary result)   Collection Time: 02/10/20  8:53 AM   Specimen: BLOOD  Result Value Ref Range Status   Specimen Description   Final    BLOOD Performed at [facility] [address] [place], [place] [address]    Special Requests   Final    BOTTLES DRAWN AEROBIC AND ANAEROBIC Blood Culture adequate volume Performed at [facility] [address] [place], [place] [address]    Culture   Final    NO GROWTH 3 DAYS Performed at [facility], [place] [address], [place], [place] [address]    Report Status PENDING  Incomplete  Urine culture     Status: None   Collection Time: 02/10/20  9:23 AM   Specimen: In/Out Cath Urine  Result Value Ref Range Status   Specimen Description   Final    IN/OUT CATH URINE Performed at [facility] [address], [place], [place] [address]    Special Requests   Final    NONE Performed at [facility], [place] [address], [place], [place] [address]    Culture   Final    NO GROWTH Performed at [facility], [place] [address] [place],  [address]    Report Status 02/11/2020 FINAL  Final  SARS  Coronavirus 2 by RT PCR (hospital order, performed in [facility] hospital lab) Nasopharyngeal Nasopharyngeal Swab     Status: None   Collection Time: 02/10/20 11:57 AM   Specimen: Nasopharyngeal Swab  Result Value Ref Range Status   SARS Coronavirus 2 NEGATIVE NEGATIVE Final    Comment: (NOTE) SARS-CoV-2  target nucleic acids are NOT DETECTED.  The SARS-CoV-2 RNA is generally detectable in upper and lower respiratory specimens during the acute phase of infection. The lowest concentration of SARS-CoV-2 viral copies this assay can detect is 250 copies / mL. A negative result does not preclude SARS-CoV-2 infection and should not be used as the sole basis for treatment or other patient management decisions.  A negative result may occur with improper specimen collection / handling, submission of specimen other than nasopharyngeal swab, presence of viral mutation(s) within the areas targeted by this assay, and inadequate number of viral copies (<250 copies / mL). A negative result must be combined with clinical observations, patient history, and epidemiological information.  Fact Sheet for Patients:   StrictlyIdeas.no  Fact Sheet for Healthcare Providers: BankingDealers.co.za  This test is not yet approved or  cleared by the [place] FDA and has been authorized for detection and/or diagnosis of SARS-CoV-2 by FDA under an Emergency Use Authorization (EUA).  This EUA will remain in effect (meaning this test can be used) for the duration of the COVID-19 declaration under Section 564(b)(1) of the Act, 21 U.S.C. section 360bbb-3(b)(1), unless the authorization is terminated or revoked sooner.  Performed at [facility], [place] [address], [place], [place] [address]   Blood culture (routine x 2)     Status: None (Preliminary result)   Collection Time: 02/10/20  3:36 PM   Specimen: BLOOD  Result Value Ref Range Status   Specimen Description   Final    BLOOD RIGHT ANTECUBITAL Performed at [facility] [address], [place], [place] [address]    Special Requests   Final    BOTTLES DRAWN AEROBIC AND ANAEROBIC Blood Culture results may not be optimal due to an inadequate volume of blood received in culture  bottles Performed at [facility] [address], [place], [place] [address]    Culture   Final    NO GROWTH 3 DAYS Performed at [facility], [place] [address], [place], [place] [address]    Report Status PENDING  Incomplete  MRSA PCR Screening     Status: None   Collection Time: 02/12/20 11:18 AM   Specimen: Nasopharyngeal  Result Value Ref Range Status   MRSA by PCR NEGATIVE NEGATIVE Final    Comment:        The GeneXpert MRSA Assay (FDA approved for NASAL specimens only), is one component of a comprehensive MRSA colonization surveillance program. It is not intended to diagnose MRSA infection nor to guide or monitor treatment for MRSA infections. Performed at [facility], [place] [address], [place], [place] [address]       Studies:  No results found.  Assessment: 64 y.o.  woman status post left breast upper outer quadrant and left axillary lymph node biopsies 11/12/2019 for a clinical T2 N2, stage IIIA invasive ductal carcinoma, grade 3, estrogen and progesterone receptor positive, HER-2 not amplified, with an MIB-1 of 70%             (a) the biopsied axillary lymph node was positive  (1) genetics testing 11/29/2019 through the Breast Cancer STAT Panel offered  by [facility] found no deleterious mutations in ATM, BRCA1, BRCA2, CDH1, CHEK2, PALB2, PTEN, STK11 and TP53.  Additional testing through the Common Hereditary Cancers Panel confirmed no deleterious mutations in APC, ATM, AXIN2, BARD1, BMPR1A, BRCA1, BRCA2, BRIP1, CDH1, CDK4, CDKN2A (p14ARF), CDKN2A (p16INK4a), CHEK2, CTNNA1, DICER1, EPCAM (Deletion/duplication testing only), GREM1 (promoter region deletion/duplication testing only), KIT, MEN1, MLH1, MSH2, MSH3, MSH6, MUTYH, NBN, NF1, NHTL1, PALB2, PDGFRA, PMS2, POLD1, POLE, PTEN, RAD50, RAD51C, RAD51D, RNF43, SDHB, SDHC, SDHD, SMAD4, SMARCA4. STK11, TP53, TSC1, TSC2, and VHL.  The following genes were evaluated for sequence  changes only: SDHA and HOXB13 c.251G>A variant only.  (2) status post left modified radical mastectomy 12/19/2019 for a pT2 pN1, stage IIB invasive ductal carcinoma, grade 3, with negative margins.             (a) 1 of 13 axillary lymph nodes removed was positive  (3) adjuvant chemotherapy will consisting of cyclophosphamide and doxorubicin x4 started 06/08/2021to be followed by weekly paclitaxel x12             (a) baseline echocardiogram 11/26/2019 shows an ejection fraction in the 65-70% range.             (b) first cycle of cyclophosphamide and doxorubicin dose reduced to assess tolerance  (4) adjuvant radiation as appropriate  (5) antiestrogens to follow  Plan:  [name] did well with her first chemotherapy treatment and I am not sure that the left foot issue is really directly related.  She will benefit from allopurinol as an outpatient.  She is already scheduled to see [place] 02/19/2020 and to receive her second cycle of cyclophosphamide and doxorubicin that day.  I am comfortable postponing it for a week but she is very intent on being treated every 2 weeks and "getting it over with".  We will make that decision when she returns on Tuesday  I greatly appreciate your help to this patient and her family!  Chauncey Cruel, MD 02/14/2020  9:22 AM Medical Oncology and Hematology [facility] [address] [place], [place] [address] Tel. [phone]    Fax. [phone]

## 2020-02-14 NOTE — Progress Notes (Signed)
    Durable Medical Equipment  (From admission, onward)         Start     Ordered   02/14/20 1036  For home use only DME Hospital bed  Once       Question Answer Comment  Length of Need Lifetime   The above medical condition requires: Patient requires the ability to reposition frequently   Head must be elevated greater than: 45 degrees   Bed type Heavy-duty, semi-electric (for patients >350 lbs.)   Trapeze Bar Yes   Support Surface: Low Air loss Mattress      02/14/20 1037

## 2020-02-14 NOTE — Progress Notes (Signed)
Physical Therapy Treatment Patient Details Name: [name] MRN: [id number] DOB: 03-Mar-1956 Today's Date: 02/14/2020    History of Present Illness [name] is a 64 y.o. female with a history of left breast CA Dx April 2021 s/p mastectomy and undergoing chemotherapy, lymphedema, poor wound healing, venous insufficiency, stage III CKD with previous acute renal failure, EtOH use who presented to the [facility] with left leg/foot swelling and pain beginning in the ankle and extending proximally over the course of 1 day. Exam was concerning for cellulitis versus DVT. U/S was negative for DVT. WBC 24.4k, SCr near baseline at 1.21, hgb near baseline at 8.4g/dl. Vancomycin and ceftriaxone were given for cellulitis. Code sepsis was called due to severe tachycardia, leukocytosis. BP was elevated . Recently in hospital for hypotension, AKI on CKD    PT Comments    Pt is determined to go home today but has yet to get OOB.  Pt declined any attempts with nursing staff.  Pt needs to be able to perform a pivot transfer to go home via car.  Assisted OOB.  General bed mobility comments: was self able with increased time, HOB elevated and use of rail.  Pt plans to get a hospital bed for home use. General transfer comment: nervous at first but pt was self able to rise from elevated using forward momentum, forward weight shit and pulling up on walker.  Pt was also able to tolerate WBing thru L LE with "some pain" but pt was also happy to be able to stand.  With increased time and some difficulty, pt was able to complete 1/4 pivot turn to recliner with excessive lean on walker.  Very short, shuffled, side steps.  Then an uncontrolled "plop" in recliner.  Pt believes she WILL be able to get in/out car to her whellchir to get into her handicapped apartment. General Gait Details: transfers only this session due to difficulty with transfer but plans to use her wc to get into her handicapped apartment.  Pt has multiple family  members to assist at home.   Pt is clear to D/C via car   Follow Up Recommendations  Home health PT;Supervision/Assistance - 24 hour     Equipment Recommendations  Rolling walker with 5" wheels;Hospital bed (Bariatric YOUTH RW)    Recommendations for Other Services       Precautions / Restrictions Precautions Precautions: Fall Restrictions Other Position/Activity Restrictions: BMI 58 and height 5'1"    Mobility  Bed Mobility Overal bed mobility: Modified Independent             General bed mobility comments: was self able with increased time, HOB elevated and use of rail.  Pt plans to get a hospital bed for home use.  Transfers Overall transfer level: Needs assistance Equipment used: Rolling walker (2 wheeled) ([employer] RW) Transfers: Sit to/from Stand Sit to Stand: Supervision         General transfer comment: nervous at first but pt was self able to rise from elevated using forward momentum, forward weight shit and pulling up on walker.  Pt was also able to tolerate WBing thru L LE with "some pain" but pt was also happy to be able to stand.  With increased time and some difficulty, pt was able to complete 1/4 pivot turn to recliner with excessive lean on walker.  Very short, shuffled, side steps.  Then an uncontrolled "plop" in recliner.  Pt believes she WILL be able to get in/out car to her whellchir to  get into her handicapped apartment.  Ambulation/Gait         Gait velocity: decreased   General Gait Details: transfers only this session due to difficulty with transfer but plans to use her wc to get into her handicapped apartment   Stairs             Wheelchair Mobility    Modified Rankin (Stroke Patients Only)       Balance                                            Cognition   Behavior During Therapy: WFL for tasks assessed/performed Overall Cognitive Status: Within Functional Limits for tasks assessed                                  General Comments: pt determined to go home today      Exercises      General Comments        Pertinent Vitals/Pain Pain Assessment: Faces Faces Pain Scale: Hurts little more Pain Location: L ankle/foot Pain Descriptors / Indicators: Discomfort;Moaning;Stabbing Pain Intervention(s): Monitored during session    Home Living                      Prior Function            PT Goals (current goals can now be found in the care plan section) Progress towards PT goals: Progressing toward goals    Frequency    Min 3X/week      PT Plan Current plan remains appropriate    Co-evaluation              AM-PAC PT "6 Clicks" Mobility   Outcome Measure  Help needed turning from your back to your side while in a flat bed without using bedrails?: None Help needed moving from lying on your back to sitting on the side of a flat bed without using bedrails?: A Little Help needed moving to and from a bed to a chair (including a wheelchair)?: A Little Help needed standing up from a chair using your arms (e.g., wheelchair or bedside chair)?: A Little Help needed to walk in hospital room?: A Lot Help needed climbing 3-5 steps with a railing? : Total 6 Click Score: 16    End of Session Equipment Utilized During Treatment: Gait belt Activity Tolerance: Patient tolerated treatment well Patient left: in chair;with call bell/phone within reach Nurse Communication: Mobility status PT Visit Diagnosis: Other abnormalities of gait and mobility (R26.89);Muscle weakness (generalized) (M62.81)     Time: 1105-1130 PT Time Calculation (min) (ACUTE ONLY): 25 min  Charges:  $Therapeutic Activity: 23-37 mins                     {Rebekka Lobello  PTA Acute  Rehabilitation Services Pager      [phone] Office      [phone]

## 2020-02-14 NOTE — TOC Initial Note (Signed)
Transition of Care [facility]) - Initial/Assessment Note    Patient Details  Name: [name] MRN: [id number] Date of Birth: 14-Jul-1956  Transition of Care [facility]) CM/SW Contact:    Rikita Grabert, Marjie Skiff, RN Phone Number: 02/14/2020, 11:21 AM  Clinical Narrative:                 Pt from home with family support and was receiving outpatient OT prior to admission. Pt states that she needs home aide services and is working with Medicaid to get this. Pt also requesting hospital bed. Hospital bed ordered and [facility] alerted of need. Pt states that she has a wheelchair and her family gets her to and from places with her wheelchair.   Expected Discharge Plan: Home/Self Care Barriers to Discharge: No Barriers Identified  Expected Discharge Plan and Services Expected Discharge Plan: Home/Self Care   Discharge Planning Services: CM Consult   Living arrangements for the past 2 months: Single Family Home Expected Discharge Date: 02/14/20               DME Arranged: Hospital bed DME Agency: [employer] Date DME Agency Contacted: 02/14/20 Time DME Agency Contacted: 1107 Representative spoke with at DME Agency: [place]            Prior Living Arrangements/Services Living arrangements for the past 2 months: [place]   Patient language and need for interpreter reviewed:: Yes Do you feel safe going back to the place where you live?: Yes               Activities of Daily Living Home Assistive Devices/Equipment: Eyeglasses, Environmental consultant (specify type), Wheelchair ADL Screening (condition at time of admission) Patient's cognitive ability adequate to safely complete daily activities?: Yes Is the patient deaf or have difficulty hearing?: No Does the patient have difficulty seeing, even when wearing glasses/contacts?: No Does the patient have difficulty concentrating, remembering, or making decisions?: No Patient able to express need for assistance with ADLs?: Yes Does the patient have  difficulty dressing or bathing?: No Independently performs ADLs?: Yes (appropriate for developmental age) Does the patient have difficulty walking or climbing stairs?: No Weakness of Legs: None Weakness of Arms/Hands: None  Permission Sought/Granted Permission sought to share information with : Facility Art therapist granted to share information with : Yes, Verbal Permission Granted     Permission granted to share info w AGENCY: [employer]        Emotional Assessment Appearance:: Appears stated age Attitude/Demeanor/Rapport: Engaged Affect (typically observed): Accepting Orientation: : Oriented to Self, Oriented to Place, Oriented to  Time, Oriented to Situation      Admission diagnosis:  Cellulitis of left lower extremity [L03.116] Sepsis without acute organ dysfunction, due to unspecified organism [facility]) [A41.9] Patient Active Problem List   Diagnosis Date Noted  . Hypertensive emergency 02/10/2020  . Cellulitis of left lower extremity without foot 02/10/2020  . Cellulitis of left lower extremity 02/10/2020  . Acute renal failure (ARF) ([place]) 01/22/2020  . Acute hyponatremia 01/22/2020  . S/P left mastectomy 12/19/2019  . Genetic testing 11/30/2019  . Morbid obesity with BMI of 60.0-69.9, adult ([place]) 11/21/2019  . Family history of ovarian cancer   . Malignant neoplasm of upper-outer quadrant of left breast in female, estrogen receptor positive ([place]) 11/14/2019  . Fracture of radial head, right, closed 06/25/2019  . Frequent falls 01/10/2019  . Bilateral bunions 01/10/2019  . Toenail fungus 01/10/2019  . Alcoholic hepatitis 88/41/6606  . Edema 11/20/2018  . Alcohol-induced mood  disorder ([place]) 11/01/2013  . Alcohol dependence ([place]) 11/01/2013  . Essential hypertension 06/20/2007  . LOW BACK PAIN 06/20/2007  . DIVERTICULOSIS, COLON 05/05/2007   PCP:  [name], MD Pharmacy:   [id number] [place], [place] [address] Phone: [phone] Fax: [phone]     Social Determinants of Health (SDOH) Interventions    Readmission Risk Interventions Readmission Risk Prevention Plan 02/13/2020  Transportation Screening Complete  Medication Review ([place]) Complete  PCP or Specialist appointment within 3-5 days of discharge Complete  HRI or [place] Complete  SW Recovery Care/Counseling Consult Complete  Palliative Care Screening Complete  [place] Not Applicable  Some recent data might be hidden

## 2020-02-14 NOTE — Telephone Encounter (Signed)
Release I.V:[id number] Faxed medical records to [facility] @ BOB#[phone]

## 2020-02-14 NOTE — Discharge Summary (Signed)
Physician Discharge Summary  [name] PPI:[id number] DOB: 10/25/1955 DOA: 02/10/2020  PCP: [name], MD  Admit date: 02/10/2020 Discharge date: 02/14/2020  Time spent: 20 minutes  Recommendations for Outpatient Follow-up:  1. CBC Chem-12 and other lab work as per oncology on follow-up on Tuesday coming 2. Ordered lift chair pleuritic in hospital bed for patient at home to help with disposition 3. Will require initiation of allopurinol once her acute gouty flare of the left lower extremity is improved and may require this lifelong given chemotherapy 4. Suggest continued goals of care discussions in the outpatient setting-she changed her CODE STATUS this admission from DNR to full code after discussion with her family on 6/16  Discharge Diagnoses:  Principal Problem:   Cellulitis of left lower extremity without foot Active Problems:   Essential hypertension   Alcohol-induced mood disorder ([place])   Alcohol dependence ([place])   Alcoholic hepatitis   Malignant neoplasm of upper-outer quadrant of left breast in female, estrogen receptor positive ([place])   Morbid obesity with BMI of 60.0-69.9, adult ([place])   S/P left mastectomy   Hypertensive emergency   Cellulitis of left lower extremity   Discharge Condition: Improved but overall guarded diabetic  Diet recommendation: Heart healthy  Filed Weights   02/10/20 0751  Weight: (!) 139.3 kg    History of present illness:  64 y.o. female left breast CA Dx April 2021 s/p mastectomy and undergoing chemotherapy, lymphedema, poor wound healing, venous insufficiency, stage III CKD with previous acute renal failure, EtOH  Came to [facility] 6/13 with left leg/foot swelling and pain beginning in the ankle and extending proximally over the course of 1 day.  Exam =cellulitis versus DVT.   U/S was negative for DVT.  WBC 24.4k, SCr near baseline at 1.21, hgb near baseline at 8.4g/dl.  Vancomycin and ceftriaxone were given for cellulitis. Code sepsis  was called due to severe tachycardia, leukocytosis. BP was elevated in the setting of being taken of antihypertensives and nitroglycerin infusion was started, patient admitted to SDU.   Hospital Course:  LLE cellulitis: Complicated by lymphedema, obesity, venous insufficiency. Pt is not diabetic. No DVT on venous U/S.  recently  ABI was 1.07. No evidence of wound that is reported to have healed 6/4 on exam. XR not showing erosive changes.  - Narrow abx to ceftriaxone to off [rx 4 days treated and stopped treatment on 6/16 as it was not felt to be a cellulitis but more likely a gout flare] ddx for  leukocytosis on admission was related to GCSF on 6/10, resolved. - Monitor blood cultures (NGTD on discharge] - Pain control with oxycodone prn prescription written , colchicine,    Suspected acute gout flare: Abrupt onset of pain and prompt response to steroid suggests thi Dx with elevated uric acid (9.1). - Continue steroid, deescalate to prednisone 40mg -and discontinued on discharge given the significant improvement - colchicine caused severe diarrh overnight--no further diarrhea-I have cautioned her that she will experience some diarrhea with this medication as a side effect   HTN urgency: Echo with G1DD, LVEF 70%, IVC appeared normal. - Titrated up on beta blocker, will switch metoprolol > coreg for additional alpha blockade to assist with BP lowering - Added hydralazine would suggest if covered by insurance---transition to BiDil - Will avoid diuretics and RAAS agents for now [can precipitate gout[]    Left breast cancer s/p mastectomy now on adjuvant chemo (cyclophosphamide and doxorubicin), last dose 02/07/2020.  - Appreciate Dr. Jana Hakim input   Anemia: Likely related  to chronic disease/malignancy and chemotherapy. -Transfused 1 unit 02/13/2020, discuss further with Dr. Jana Hakim who is okay with the hemoglobin of 6.7 and will follow up in the outpatient setting as this is likely all related to  her chemotherapy -rpt labs am   Stage IIIa CKD:  - SCr at baseline, will avoid nephrotoxins. Will not restart ARB at this time.    Marijuana use: +THC on UDS. Hx EtOH abuse per report, though not currently and negative level at admission. - Cessation counseling provided.    Anxiety:  - Continue prn   Hypomagnesemia:  - Supplement again today and monitor.    Alcohol use, hepatitis: Does not appear to be active issue.   Obesity: Estimated body mass index is 58.01 kg/m as calculated from the following:   Height as of this encounter: 5\' 1"  (1.549 m).   Weight as of this encounter: 139.3 kg.   Procedures:  Multiple imaging studies Consultations:  Oncology  Discharge Exam: Vitals:   02/13/20 2319 02/14/20 0539  BP: 140/76 138/66  Pulse: 89 92  Resp: 18 17  Temp: 97.8 F (36.6 C) 97.9 F (36.6 C)  SpO2: 99% 99%    General: Awake coherent no distress thick neck Mallampati 4 Cardiovascular: S1-S2 no murmur rub or gallop Respiratory: Clear no added sound Chest wall has mastectomy Left lower extremity is soft nontender no rebound slightly warm she does not have any fluctuance she is able to move her ankle fairly well Neurologically intact  Discharge Instructions   Discharge Instructions    Diet - low sodium heart healthy   Complete by: As directed    Discharge instructions   Complete by: As directed    You will need to continue the colchicine for gout I do not think you have an infection in your foot quite frankly We have discontinued your steroids and prescribed you a limited amount of oxycodone for pain breakthrough from your foot I have ordered a lift chair.  We can order the equipment to help you transition home with your son Please get lab counts in the next several days at your outpatient appointment with oncology Please also keep other appointments that you have   Increase activity slowly   Complete by: As directed      Allergies as of 02/14/2020       Reactions   Amlodipine Swelling   BLE edema      Medication List    STOP taking these medications   dexamethasone 4 MG tablet Commonly known as: DECADRON   prochlorperazine 10 MG tablet Commonly known as: COMPAZINE     TAKE these medications   aspirin 81 MG EC tablet Take 1 tablet (81 mg total) by mouth daily. Swallow whole.   carvedilol 12.5 MG tablet Commonly known as: COREG Take 1 tablet (12.5 mg total) by mouth 2 (two) times daily with a meal.   colchicine 0.6 MG tablet Take 0.5 tablets (0.3 mg total) by mouth every other day.   hydrALAZINE 25 MG tablet Commonly known as: APRESOLINE Take 1 tablet (25 mg total) by mouth every 8 (eight) hours.   lidocaine-prilocaine cream Commonly known as: EMLA Apply 1 application topically as needed.   ondansetron 4 MG tablet Commonly known as: Zofran Take 1 tablet (4 mg total) by mouth every 8 (eight) hours as needed for nausea or vomiting.   Oxycodone HCl 10 MG Tabs Take 1 tablet (10 mg total) by mouth every 4 (four) hours as needed for up to 3  days for moderate pain.      Allergies  Allergen Reactions  . Amlodipine Swelling    BLE edema      The results of significant diagnostics from this hospitalization (including imaging, microbiology, ancillary and laboratory) are listed below for reference.    Significant Diagnostic Studies: DG Ankle 2 Views Left  Result Date: 02/11/2020 CLINICAL DATA:  Swelling and warmth EXAM: LEFT ANKLE: 2 VIEW COMPARISON:  November 04, 2018 FINDINGS: Frontal and lateral views were obtained. There is marked soft tissue swelling. No fracture or joint effusion. No appreciable joint space narrowing. There is a posterior calcaneal spur. No erosive change or bony destruction. IMPRESSION: Diffuse soft tissue swelling. No fracture or bony destruction. No erosion. No appreciable joint space narrowing. There is a posterior calcaneal spur. Electronically Signed   By: Lowella Grip III M.D.   On: 02/11/2020  10:22   US RENAL  Result Date: 01/22/2020 CLINICAL DATA:  Acute renal failure. EXAM: RENAL / URINARY TRACT ULTRASOUND COMPLETE COMPARISON:  None. FINDINGS: Right Kidney: Renal measurements: 12.6 x 5.4 x 6.4 cm = volume: 224 mL . Echogenicity within normal limits. No mass or hydronephrosis visualized. Left Kidney: Renal measurements: 11.2 x 6.0 x 5.7 cm = volume: 202 mL. Echogenicity within normal limits. No mass or hydronephrosis visualized. Bladder: Appears normal for degree of bladder distention. Other: None. IMPRESSION: Normal renal ultrasound. Electronically Signed   By: Lajean Manes M.D.   On: 01/22/2020 14:46   DG Chest Portable 1 View  Result Date: 02/10/2020 CLINICAL DATA:  Breast carcinoma with recent blood transfusion. Concern for potential sepsis EXAM: PORTABLE CHEST 1 VIEW COMPARISON:  December 19, 2019 FINDINGS: Port-A-Cath tip is in the superior vena cava. No pneumothorax. The lungs are clear. The heart is mildly enlarged with pulmonary vascularity normal. No adenopathy. There are surgical clips in the left axillary region. IMPRESSION: Port-A-Cath as described without pneumothorax. Lungs clear. Mild cardiomegaly. No evident adenopathy. Electronically Signed   By: Lowella Grip III M.D.   On: 02/10/2020 09:53   DG Foot 2 Views Left  Result Date: 02/11/2020 CLINICAL DATA:  Soft tissue swelling and warmth EXAM: LEFT FOOT - 2 VIEW COMPARISON:  November 04, 2018 FINDINGS: Frontal and lateral views were obtained. There is marked soft tissue swelling. No evident fracture or dislocation. There is degenerative change with narrowing of the first MTP joint. Other joint spaces appear unremarkable. There is a posterior calcaneal spur. No erosive change or bony destruction. Bones appear somewhat osteoporotic. IMPRESSION: Marked soft tissue swelling. No bony destruction or erosion. Narrowing first MTP joint. Other joint spaces appear unremarkable. There is a posterior calcaneal spur. Electronically Signed    By: Lowella Grip III M.D.   On: 02/11/2020 10:18   ECHOCARDIOGRAM COMPLETE  Result Date: 02/11/2020    ECHOCARDIOGRAM REPORT   Patient Name:   [name] Date of Exam: 02/11/2020 Medical Rec #:  [id number]     Height:       61.0 in Accession #:    [id number]    Weight:       307.0 lb Date of Birth:  Dec 14, 1955    BSA:          2.266 m Patient Age:    64 years      BP:           164/71 mmHg Patient Gender: F             HR:  123 bpm. Exam Location:  Inpatient Procedure: 2D Echo, Cardiac Doppler, Color Doppler and Intracardiac            Opacification Agent Indications:    Hypertensive emergency  History:        Patient has prior history of Echocardiogram examinations, most                 recent 01/22/2020. Risk Factors:Hypertension and Non-Smoker.                 Breast cancer.  Sonographer:    Vickie Epley RDCS Referring Phys: [id number] [place]  1. Left ventricular ejection fraction, by estimation, is 70 to 75%. The left ventricle has hyperdynamic function. The left ventricle has no regional wall motion abnormalities. Left ventricular diastolic parameters are consistent with Grade I diastolic dysfunction (impaired relaxation). Elevated left ventricular end-diastolic pressure.  2. Right ventricular systolic function is normal. The right ventricular size is normal. Tricuspid regurgitation signal is inadequate for assessing PA pressure.  3. The mitral valve is normal in structure. No evidence of mitral valve regurgitation. No evidence of mitral stenosis.  4. The aortic valve is normal in structure. Aortic valve regurgitation is not visualized. No aortic stenosis is present.  5. The inferior vena cava is normal in size with greater than 50% respiratory variability, suggesting right atrial pressure of 3 mmHg. FINDINGS  Left Ventricle: Left ventricular ejection fraction, by estimation, is 70 to 75%. The left ventricle has hyperdynamic function. The left ventricle has no regional wall  motion abnormalities. Definity contrast agent was given IV to delineate the left ventricular endocardial borders. The left ventricular internal cavity size was normal in size. There is no left ventricular hypertrophy. Left ventricular diastolic parameters are consistent with Grade I diastolic dysfunction (impaired relaxation). Elevated left ventricular end-diastolic pressure. Right Ventricle: The right ventricular size is normal. No increase in right ventricular wall thickness. Right ventricular systolic function is normal. Tricuspid regurgitation signal is inadequate for assessing PA pressure. Left Atrium: Left atrial size was normal in size. Right Atrium: Right atrial size was normal in size. Pericardium: There is no evidence of pericardial effusion. Mitral Valve: The mitral valve is normal in structure. Normal mobility of the mitral valve leaflets. No evidence of mitral valve regurgitation. No evidence of mitral valve stenosis. Tricuspid Valve: The tricuspid valve is normal in structure. Tricuspid valve regurgitation is trivial. No evidence of tricuspid stenosis. Aortic Valve: The aortic valve is normal in structure. Aortic valve regurgitation is not visualized. No aortic stenosis is present. Pulmonic Valve: The pulmonic valve was normal in structure. Pulmonic valve regurgitation is not visualized. No evidence of pulmonic stenosis. Aorta: The aortic root is normal in size and structure. Venous: The inferior vena cava is normal in size with greater than 50% respiratory variability, suggesting right atrial pressure of 3 mmHg. IAS/Shunts: No atrial level shunt detected by color flow Doppler.  LEFT VENTRICLE PLAX 2D LVIDd:         3.80 cm  Diastology LVIDs:         2.30 cm  LV e' lateral:   8.49 cm/s LV PW:         0.80 cm  LV E/e' lateral: 13.9 LV IVS:        0.80 cm  LV e' medial:    7.18 cm/s LVOT diam:     1.70 cm  LV E/e' medial:  16.4 LV SV:         51 LV SV  Index:   23 LVOT Area:     2.27 cm  RIGHT VENTRICLE  RV S prime:     16.40 cm/s TAPSE (M-mode): 1.9 cm LEFT ATRIUM             Index       RIGHT ATRIUM           Index LA diam:        3.30 cm 1.46 cm/m  RA Area:     12.60 cm LA Vol (A2C):   39.3 ml 17.34 ml/m RA Volume:   27.30 ml  12.05 ml/m LA Vol (A4C):   38.0 ml 16.77 ml/m LA Biplane Vol: 41.6 ml 18.36 ml/m  AORTIC VALVE LVOT Vmax:   170.00 cm/s LVOT Vmean:  121.000 cm/s LVOT VTI:    0.225 m  AORTA Ao Root diam: 2.60 cm MITRAL VALVE MV Area (PHT): 5.75 cm     SHUNTS MV Decel Time: 132 msec     Systemic VTI:  0.22 m MV E velocity: 118.00 cm/s  Systemic Diam: 1.70 cm MV A velocity: 159.00 cm/s MV E/A ratio:  0.74 Fransico Him MD Electronically signed by Fransico Him MD Signature Date/Time: 02/11/2020/9:20:01 AM    Final    VAS [place] LOWER EXTREMITY VENOUS (DVT) (ONLY [facility] & [facility])  Result Date: 02/10/2020  Lower Venous DVTStudy Indications: Swelling.  Limitations: Body habitus. Comparison Study: 11/04/18 negative Performing Technologist: June Leap RDMS, RVT  Examination Guidelines: A complete evaluation includes B-mode imaging, spectral Doppler, color Doppler, and power Doppler as needed of all accessible portions of each vessel. Bilateral testing is considered an integral part of a complete examination. Limited examinations for reoccurring indications may be performed as noted. The reflux portion of the exam is performed with the patient in reverse Trendelenburg.  +---------+---------------+---------+-----------+----------+--------------+ LEFT     CompressibilityPhasicitySpontaneityPropertiesThrombus Aging +---------+---------------+---------+-----------+----------+--------------+ CFV      Full           Yes      Yes                                 +---------+---------------+---------+-----------+----------+--------------+ SFJ      Full                                                        +---------+---------------+---------+-----------+----------+--------------+ FV Prox  Full                                                         +---------+---------------+---------+-----------+----------+--------------+ FV Mid   Full                                                        +---------+---------------+---------+-----------+----------+--------------+ FV DistalFull                                                        +---------+---------------+---------+-----------+----------+--------------+  PFV                                                   Not visualized +---------+---------------+---------+-----------+----------+--------------+ POP      Full           Yes      Yes                                 +---------+---------------+---------+-----------+----------+--------------+ PTV      Full                                                        +---------+---------------+---------+-----------+----------+--------------+ PERO                                                  Not visualized +---------+---------------+---------+-----------+----------+--------------+     Summary: LEFT: - There is no evidence of deep vein thrombosis in the lower extremity. However, portions of this examination were limited- see technologist comments above.  *See table(s) above for measurements and observations. Electronically signed by Monica Martinez MD on 02/10/2020 at 11:15:19 AM.    Final    ECHOCARDIOGRAM LIMITED  Result Date: 01/22/2020    ECHOCARDIOGRAM LIMITED REPORT   Patient Name:   [name] Date of Exam: 01/22/2020 Medical Rec #:  [id number]     Height:       61.0 in Accession #:    [id number]    Weight:       303.9 lb Date of Birth:  Oct 28, 1955    BSA:          2.256 m Patient Age:    47 years      BP:           92/60 mmHg Patient Gender: F             HR:           73 bpm. Exam Location:  Inpatient Procedure: Limited Echo Indications:    Pericardial effusion  History:        Patient has prior history of Echocardiogram examinations, most                 recent  11/26/2019. Risk Factors:Hypertension. Breast cancer, s/p                 mastectomy.  Sonographer:    Dustin Flock Referring Phys: [place]  1. Left ventricular ejection fraction, by estimation, is 65 to 70%. The left ventricle has normal function. The left ventricle has no regional wall motion abnormalities. There is mild left ventricular hypertrophy.  2. Right ventricular systolic function is normal. The right ventricular size is normal.  3. The inferior vena cava is normal in size with greater than 50% respiratory variability, suggesting right atrial pressure of 3 mmHg.  4. No pericardial effusion.  5. Limited echo with no doppler. FINDINGS  Left Ventricle: Left ventricular ejection fraction, by estimation, is 65 to 70%. The left ventricle has  normal function. The left ventricle has no regional wall motion abnormalities. The left ventricular internal cavity size was normal in size. There is  mild left ventricular hypertrophy. Right Ventricle: The right ventricular size is normal. No increase in right ventricular wall thickness. Right ventricular systolic function is normal. Left Atrium: Left atrial size was normal in size. Right Atrium: Right atrial size was normal in size. Pericardium: There is no evidence of pericardial effusion. Venous: The inferior vena cava is normal in size with greater than 50% respiratory variability, suggesting right atrial pressure of 3 mmHg. IAS/Shunts: No atrial level shunt detected by color flow Doppler.  LEFT VENTRICLE PLAX 2D LVIDd:         3.30 cm LVIDs:         1.80 cm LV PW:         1.50 cm LV IVS:        1.30 cm  Loralie Champagne MD Electronically signed by Loralie Champagne MD Signature Date/Time: 01/22/2020/3:19:05 PM    Final     Microbiology: Recent Results (from the past 240 hour(s))  Blood culture (routine x 2)     Status: None (Preliminary result)   Collection Time: 02/10/20  8:53 AM   Specimen: BLOOD  Result Value Ref Range Status   Specimen  Description   Final    BLOOD Performed at [facility], [place] [address] [place], [place] [address]    Special Requests   Final    BOTTLES DRAWN AEROBIC AND ANAEROBIC Blood Culture adequate volume Performed at [place] [address] [place], [place] [address]    Culture   Final    NO GROWTH 4 DAYS Performed at [facility], [place] [address], [place], [place] [address]    Report Status PENDING  Incomplete  Urine culture     Status: None   Collection Time: 02/10/20  9:23 AM   Specimen: In/Out Cath Urine  Result Value Ref Range Status   Specimen Description   Final    IN/OUT CATH URINE Performed at [place] [address], [place], [place] [address]    Special Requests   Final    NONE Performed at [facility], [place] [address], [place], [place] [address]    Culture   Final    NO GROWTH Performed at [facility], [place] [address], [place],  [address]    Report Status 02/11/2020 FINAL  Final  SARS Coronavirus 2 by RT PCR (hospital order, performed in [facility] hospital lab) Nasopharyngeal Nasopharyngeal Swab     Status: None   Collection Time: 02/10/20 11:57 AM   Specimen: Nasopharyngeal Swab  Result Value Ref Range Status   SARS Coronavirus 2 NEGATIVE NEGATIVE Final    Comment: (NOTE) SARS-CoV-2 target nucleic acids are NOT DETECTED.  The SARS-CoV-2 RNA is generally detectable in upper and lower respiratory specimens during the acute phase of infection. The lowest concentration of SARS-CoV-2 viral copies this assay can detect is 250 copies / mL. A negative result does not preclude SARS-CoV-2 infection and should not be used as the sole basis for treatment or other patient management decisions.  A negative result may occur with improper specimen collection / handling, submission of specimen other than nasopharyngeal swab, presence of viral mutation(s) within the areas  targeted by this assay, and inadequate number of viral copies (<250 copies / mL). A negative result must be combined with clinical observations, patient history, and  epidemiological information.  Fact Sheet for Patients:   StrictlyIdeas.no  Fact Sheet for Healthcare Providers: BankingDealers.co.za  This test is not yet approved or  cleared by the [place] FDA and has been authorized for detection and/or diagnosis of SARS-CoV-2 by FDA under an Emergency Use Authorization (EUA).  This EUA will remain in effect (meaning this test can be used) for the duration of the COVID-19 declaration under Section 564(b)(1) of the Act, 21 U.S.C. section 360bbb-3(b)(1), unless the authorization is terminated or revoked sooner.  Performed at [facility], [place] [address], [place], [place] [address]   Blood culture (routine x 2)     Status: None (Preliminary result)   Collection Time: 02/10/20  3:36 PM   Specimen: BLOOD  Result Value Ref Range Status   Specimen Description   Final    BLOOD RIGHT ANTECUBITAL Performed at [place] [address], [place], [place] [address]    Special Requests   Final    BOTTLES DRAWN AEROBIC AND ANAEROBIC Blood Culture results may not be optimal due to an inadequate volume of blood received in culture bottles Performed at [place] [address], [place], [place] [address]    Culture   Final    NO GROWTH 4 DAYS Performed at [facility], [place] [address], [place], [place] [address]    Report Status PENDING  Incomplete  MRSA PCR Screening     Status: None   Collection Time: 02/12/20 11:18 AM   Specimen: Nasopharyngeal  Result Value Ref Range Status   MRSA by PCR NEGATIVE NEGATIVE Final    Comment:        The GeneXpert MRSA Assay (FDA approved for NASAL specimens only), is one component of a comprehensive MRSA colonization surveillance  program. It is not intended to diagnose MRSA infection nor to guide or monitor treatment for MRSA infections. Performed at [facility], [place] [address] [place], [place] [address]      Labs: Basic Metabolic Panel: Recent Labs  Lab 02/10/20 0853 02/10/20 0853 02/10/20 1536 02/11/20 0340 02/12/20 0500 02/13/20 0940 02/14/20 0632  NA 139  --   --  138 137 140 136  K 3.8  --   --  4.0 3.9 3.3* 3.4*  CL 103  --   --  104 105 107 103  CO2 23  --   --  22 21* 22 21*  GLUCOSE 116*  --   --  121* 147* 157* 102*  BUN 24*  --   --  28* 31* 41* 32*  CREATININE 1.21*   < > 1.28* 1.24* 1.34* 1.12* 1.06*  CALCIUM 7.8*  --   --  7.4* 7.7* 8.0* 7.8*  MG  --   --   --  1.1* 1.4* 1.5* 1.3*  PHOS  --   --   --  2.9 2.6 2.2* 2.8   < > = values in this interval not displayed.   Liver Function Tests: Recent Labs  Lab 02/10/20 0853 02/11/20 0340 02/12/20 0500 02/13/20 0940 02/14/20 0632  AST 43* 22 19 21 23   ALT 29 22 20 16 17   ALKPHOS 73 60 59 46 43  BILITOT 1.0 1.4* 1.1 0.7 0.7  PROT 7.6 7.1 7.1 6.2* 6.0*  ALBUMIN 3.2* 2.8* 2.6* 2.4* 2.4*   No results for input(s): LIPASE, AMYLASE in the last 168 hours. No results for input(s): AMMONIA in the last 168 hours. CBC: Recent Labs  Lab  02/10/20 0853 02/10/20 0853 02/10/20 1536 02/10/20 1536 02/11/20 0340 02/11/20 0340 02/12/20 0500 02/13/20 0940 02/13/20 1114 02/13/20 2021 02/14/20 0632  WBC 24.4*   < > 22.9*  --  11.6*  --  4.8 4.4  --   --  4.9  NEUTROABS 17.2*  --   --   --  9.5*  --  3.7 2.7  --   --  2.4  HGB 8.4*   < > 8.5*   < > 7.7*   < > 7.4* 5.9* 5.6* 7.1* 6.7*  HCT 24.8*   < > 25.9*   < > 24.1*   < > 22.7* 18.3* 17.2* 21.4* 20.2*  MCV 92.9   < > 95.6  --  96.4  --  95.8 94.8  --   --  94.0  PLT 184   < > 175  --  138*  --  138* 138*  --   --  125*   < > = values in this interval not displayed.   Cardiac Enzymes: No results for input(s): CKTOTAL, CKMB, CKMBINDEX, TROPONINI in the last 168  hours. BNP: BNP (last 3 results) No results for input(s): BNP in the last 8760 hours.  ProBNP (last 3 results) No results for input(s): PROBNP in the last 8760 hours.  CBG: No results for input(s): GLUCAP in the last 168 hours.     Signed:  [name] MD   [facility] 02/14/2020, 10:26 AM

## 2020-02-15 ENCOUNTER — Telehealth: Payer: Self-pay

## 2020-02-15 LAB — CULTURE, BLOOD (ROUTINE X 2)
Culture: NO GROWTH
Culture: NO GROWTH
Special Requests: ADEQUATE

## 2020-02-15 NOTE — Telephone Encounter (Signed)
Transition Care Management Follow-up Telephone Call Date of discharge and from where: 02/14/2020 [facility] [facility] pt at  [phone] stated she has company at this time and cannot stay on the phone to call her Monday after 9am.  Will attempt again with a call next week.

## 2020-02-18 ENCOUNTER — Telehealth: Payer: Self-pay

## 2020-02-18 DIAGNOSIS — I10 Essential (primary) hypertension: Secondary | ICD-10-CM | POA: Diagnosis not present

## 2020-02-18 MED ORDER — BLOOD PRESSURE MONITOR DEVI: 0 refills | Status: DC

## 2020-02-18 NOTE — Telephone Encounter (Signed)
Rx sent 

## 2020-02-18 NOTE — Telephone Encounter (Signed)
Transition Care Management Follow-up Telephone Call  Date of discharge and from where: 02/14/2020, [facility]   How have you been since you were released from the hospital? She stated that she is " doing fine."   Any questions or concerns?  she is requesting [facility] referral due to her need for assistance with personal care.  She is also requesting a bedside commode and wheelchair.  She has been using her mother's wheelchair but it is too small.   Items Reviewed:  Did the pt receive and understand the discharge instructions provided? Yes she received them and  she did not have any questions at this time   Medications obtained and verified?  she stated that she has all of her medications , including the new ones and did not have any questions about the med regime. Instructed her to call this clinic if she has questions after further review of her instructions.   Any new allergies since your discharge?  none reported   Do you have support at home? lives alone but has family that visits and provides some assistance.   Other (ie: DME, Home Health, etc) no home health ordered.   Has hospital bed and trapeze  Functional Questionnaire: (I = Independent and D = Dependent) ADL's  family provides assistance as needed. She has a wheelchair and walker. She stated that she is able to put some weight on her left foot and is able to stand and transfer with the walker.   Follow up appointments reviewed:    [facility] f/u appt confirmed?she said that she will call the clinic to schedule an appt after she sees her oncologist tomorrow.   [facility] f/u appt confirmed?oncology - 02/19/2020  Are transportation arrangements needed? no, she said that her niece provides transportation.   If their condition worsens, is the pt aware to call  their PCP or go to the ED?  yes  Was the patient provided with contact information for the PCP's office or ED? she has the clinic phone number  Was  the pt encouraged to call back with questions or concerns?  yes

## 2020-02-18 NOTE — Progress Notes (Signed)
[place]  [phone] [phone]) [phone]     ID: [name] DOB: 08-09-1956  MR#: [id number]  BZJ#:[id number]  Patient Care Team: [name], MD as PCP - General (Family Medicine) Mauro Kaufmann, RN as Oncology Nurse Navigator Rockwell Germany, RN as Oncology Nurse Navigator [name], MD as Consulting Physician (General Surgery) Joleah Kosak, Virgie Dad, MD as Consulting Physician (Oncology) Kyung Rudd, MD as Consulting Physician (Radiation Oncology) Edrick Kins, DPM as Consulting Physician (Podiatry) Chauncey Cruel, MD OTHER MD:  CHIEF COMPLAINT: estrogen receptor positive breast cancer  CURRENT TREATMENT: Adjuvant chemotherapy    INTERVAL HISTORY: [name] returns today for follow up and treatment of her estrogen receptor positive breast cancer.  Since her last visit, she was admitted for cellulitis of the left lower extremity and hypertensive emergency.  The patient developed pain in her left lower extremity which started the day prior to admission.  She also had swelling in the left ankle which extended up to the lower left leg.  Ultrasound negative for DVT.  She started on IV vancomycin and ceftriaxone.  Due to severe tachycardia and leukocytosis, code sepsis was called.  Of note, the patient received G-CSF on 02/07/2020 as part of her chemotherapy regimen.  She was also started on nitroglycerin infusion due to elevated BP.  A uric acid level was checked on 02/11/20 due to concern for gout and was elevated at 9.1.  She was started on colchicine and Solu-Medrol.  She started her adjuvant chemotherapy, consisting of cyclophosphamide and doxorubicin in dose dense fashion x4, on 02/05/2020.  I had dose reduced the first cycle just to make sure she will be able to tolerate it well.    Her most recent echocardiogram on 11/26/2019 showed an ejection fraction of 65-70%.   REVIEW OF SYSTEMS: [name] has recovered remarkably well from her recent  admission.  Even though we have offered to delay treatment she is ready to proceed today.  Currently she has a normal activity for her, no nausea or vomiting, no unusual fatigue, no fever rash bleeding or diarrhea.  A detailed review of systems was otherwise stable   HISTORY OF CURRENT ILLNESS: From the original intake note:  [name] (pronounced "yellow") had routine screening mammography on 10/24/2019 showing a possible abnormality in the left breast. She underwent left breast ultrasonography at [facility] on 11/05/2019 showing: suspicious 3.8 cm mass in left breast at 3 o'clock; five abnormal lymph nodes.  Accordingly on 11/12/2019 she proceeded to biopsy of the left breast area in question as well as a lymph node. The pathology from this procedure ([id number]) showed: invasive ductal carcinoma with prominent inflammatory response, grade 3. Prognostic indicators significant for: estrogen receptor, 100% positive and progesterone receptor, 50% positive, both with strong staining intensity. Proliferation marker Ki67 at 70%. HER2 negative by immunohistochemistry (0).  The biopsied lymph node confirmed metastatic breast carcinoma.  The patient's subsequent history is as detailed below.   PAST MEDICAL HISTORY: Past Medical History:  Diagnosis Date  . Arthritis   . Cancer ([place]) 11/2019   Left breast  . Diverticulitis   . Family history of ovarian cancer   . Heart murmur    11/26/19 echo: Mild AS. AV mean gradient 12.0 mmHg; however, LVOT gradient 8 mmHg with intracvitary gradient-significant AS is not suspected  . Hypertension     PAST SURGICAL HISTORY: Past Surgical History:  Procedure Laterality Date  . MASTECTOMY WITH AXILLARY LYMPH NODE DISSECTION Left 12/19/2019   Procedure:  LEFT MASTECTOMY WITH AXILLARY LYMPH NODE DISSECTION;  Surgeon: [name], MD;  Location: [facility];  Service: General;  Laterality: Left;  Marland Kitchen MULTIPLE EXTRACTIONS WITH ALVEOLOPLASTY Bilateral 12/14/2019     Procedure: MULTIPLE EXTRACTION WITH ALVEOLOPLASTY;  Surgeon: [name], DDS;  Location: [facility];  Service: Oral Surgery;  Laterality: Bilateral;  . PORTACATH PLACEMENT N/A 12/19/2019   Procedure: INSERTION PORT-A-CATH WITH ULTRASOUND GUIDANCE;  Surgeon: [name], MD;  Location: [facility];  Service: General;  Laterality: N/A;  . RADIAL HEAD ARTHROPLASTY Right 06/25/2019   Procedure: RIGHT RADIAL HEAD ARTHROPLASTY;  Surgeon: [name], MD;  Location: [facility];  Service: Orthopedics;  Laterality: Right;  . TUBAL LIGATION      FAMILY HISTORY: Family History  Problem Relation Age of Onset  . Hypertension Mother   . Dementia Mother   . Ovarian cancer Half-Sister 53  . Diabetes Half-Sister   . Stroke Half-Sister   Her father died at age 64; she is unsure of the cause of death. Her mother is age 15 as of 10/2019.  The patient has two brothers and four sisters. She reports ovarian cancer in her sister at age 66. She denies a family history of breast, prostate, or pancreatic cancer.   GYNECOLOGIC HISTORY:  No LMP recorded. Patient is postmenopausal. Menarche: 64 years old Age at first live birth: 64 years old [place] 2 LMP around age 37 Contraceptive: used previously HRT never used  Hysterectomy? no BSO? no   SOCIAL HISTORY: (updated 10/2019)  [name] retired from working at the [employer]. She is divorced. She lives at home alone but during the pandemic her sister [name] and mother Raford Pitcher lived in the same home. Daughter [name], age 54, lives in [place] and is handicapped. [name], age 25, is a Biomedical scientist here in [place]. Denny has two granddaughters. She attends a local [employer].    ADVANCED DIRECTIVES: not in place; at the 11/21/2019 visit the patient was given the appropriate documents to complete and notarized at her discretion.  She expressed spontaneously that she would not want to be resuscitated in case of a terminal event.    HEALTH MAINTENANCE: Social History    Tobacco Use  . Smoking status: Never Smoker  . Smokeless tobacco: Never Used  Vaping Use  . Vaping Use: Never used  Substance Use Topics  . Alcohol use: Yes    Alcohol/week: 3.0 standard drinks    Types: 1 Cans of beer, 2 Shots of liquor per week    Comment: daily  . Drug use: Yes    Types: Marijuana     Colonoscopy: never done  PAP: "1980?"  Bone density: never done   Allergies  Allergen Reactions  . Amlodipine Swelling    BLE edema    Current Outpatient Medications  Medication Sig Dispense Refill  . aspirin EC 81 MG EC tablet Take 1 tablet (81 mg total) by mouth daily. Swallow whole. 30 tablet 11  . Blood Pressure Monitor DEVI Use as instructed by provider to check bloos pressure daily. ICD: I10 1 each 0  . carvedilol (COREG) 12.5 MG tablet Take 1 tablet (12.5 mg total) by mouth 2 (two) times daily with a meal. 60 tablet 3  . colchicine 0.6 MG tablet Take 0.5 tablets (0.3 mg total) by mouth every other day. 20 tablet 0  . hydrALAZINE (APRESOLINE) 25 MG tablet Take 1 tablet (25 mg total) by mouth every 8 (eight) hours. 90 tablet 0  . lidocaine-prilocaine (EMLA)  cream Apply 1 application topically as needed. 30 g 0  . ondansetron (ZOFRAN) 4 MG tablet Take 1 tablet (4 mg total) by mouth every 8 (eight) hours as needed for nausea or vomiting. 20 tablet 0   No current facility-administered medications for this visit.    OBJECTIVE: African-American woman examined in the infusion area  There were no vitals filed for this visit.   There is no height or weight on file to calculate BMI.   Wt Readings from Last 3 Encounters:  02/19/20 (!) 315 lb 8 oz (143.1 kg)  02/10/20 (!) 307 lb (139.3 kg)  02/05/20 (!) 307 lb 6.4 oz (139.4 kg)  For vitals for the 02/20/2020 visit please see the treatment area flowsheet   ECOG FS:2 - Symptomatic, <50% confined to bed  Sclerae unicteric, EOMs intact Wearing a mask No cervical or supraclavicular adenopathy Lungs no rales or  rhonchi Heart regular rate and rhythm Abd soft, obese, nontender, positive bowel sounds MSK no focal spinal tenderness, no upper extremity lymphedema Neuro: nonfocal, well oriented, appropriate affect Breasts: Deferred  LAB RESULTS:  CMP     Component Value Date/Time   NA 137 02/19/2020 0925   NA 135 01/02/2020 1710   K 3.9 02/19/2020 0925   CL 104 02/19/2020 0925   CO2 18 (L) 02/19/2020 0925   GLUCOSE 113 (H) 02/19/2020 0925   BUN 18 02/19/2020 0925   BUN 25 01/02/2020 1710   CREATININE 1.76 (H) 02/19/2020 0925   CREATININE 1.59 (H) 11/21/2019 0908   CALCIUM 8.2 (L) 02/19/2020 0925   PROT 6.6 02/19/2020 0925   PROT 8.5 06/15/2019 1622   ALBUMIN 2.9 (L) 02/19/2020 0925   ALBUMIN 4.3 06/15/2019 1622   AST 33 02/19/2020 0925   AST 61 (H) 11/21/2019 0908   ALT 25 02/19/2020 0925   ALT 32 11/21/2019 0908   ALKPHOS 65 02/19/2020 0925   BILITOT 0.4 02/19/2020 0925   BILITOT 0.5 11/21/2019 0908   GFRNONAA 30 (L) 02/19/2020 0925   GFRNONAA 34 (L) 11/21/2019 0908   GFRAA 35 (L) 02/19/2020 0925   GFRAA 39 (L) 11/21/2019 0908    No results found for: TOTALPROTELP, ALBUMINELP, A1GS, A2GS, BETS, BETA2SER, GAMS, MSPIKE, SPEI  Lab Results  Component Value Date   WBC 15.2 (H) 02/19/2020   NEUTROABS 9.4 (H) 02/19/2020   HGB 7.0 (L) 02/19/2020   HCT 21.3 (L) 02/19/2020   MCV 95.1 02/19/2020   PLT 255 02/19/2020    No results found for: LABCA2  No components found for: GQQPYP950  No results for input(s): INR in the last 168 hours.  No results found for: LABCA2  No results found for: DTO671  No results found for: IWP809  No results found for: XIP382  No results found for: CA2729  No components found for: HGQUANT  No results found for: CEA1 / No results found for: CEA1   No results found for: AFPTUMOR  No results found for: CHROMOGRNA  No results found for: KPAFRELGTCHN, LAMBDASER, KAPLAMBRATIO (kappa/lambda light chains)  No results found for: HGBA,  HGBA2QUANT, HGBFQUANT, HGBSQUAN (Hemoglobinopathy evaluation)   No results found for: LDH  Lab Results  Component Value Date   IRON 111 01/22/2020   TIBC 231 (L) 01/22/2020   IRONPCTSAT 48 (H) 01/22/2020   (Iron and TIBC)  Lab Results  Component Value Date   FERRITIN 3000 (H) 11/10/2007    Urinalysis    Component Value Date/Time   COLORURINE YELLOW 02/10/2020 [place] 02/10/2020 1427  LABSPEC 1.012 02/10/2020 1427   PHURINE 5.0 02/10/2020 1427   GLUCOSEU NEGATIVE 02/10/2020 1427   HGBUR SMALL (A) 02/10/2020 1427   BILIRUBINUR NEGATIVE 02/10/2020 1427   KETONESUR NEGATIVE 02/10/2020 1427   PROTEINUR NEGATIVE 02/10/2020 1427   UROBILINOGEN 1.0 07/14/2012 1630   NITRITE NEGATIVE 02/10/2020 1427   LEUKOCYTESUR NEGATIVE 02/10/2020 1427     STUDIES: DG Ankle 2 Views Left  Result Date: 02/11/2020 CLINICAL DATA:  Swelling and warmth EXAM: LEFT ANKLE: 2 VIEW COMPARISON:  November 04, 2018 FINDINGS: Frontal and lateral views were obtained. There is marked soft tissue swelling. No fracture or joint effusion. No appreciable joint space narrowing. There is a posterior calcaneal spur. No erosive change or bony destruction. IMPRESSION: Diffuse soft tissue swelling. No fracture or bony destruction. No erosion. No appreciable joint space narrowing. There is a posterior calcaneal spur. Electronically Signed   By: Lowella Grip III M.D.   On: 02/11/2020 10:22   US RENAL  Result Date: 01/22/2020 CLINICAL DATA:  Acute renal failure. EXAM: RENAL / URINARY TRACT ULTRASOUND COMPLETE COMPARISON:  None. FINDINGS: Right Kidney: Renal measurements: 12.6 x 5.4 x 6.4 cm = volume: 224 mL . Echogenicity within normal limits. No mass or hydronephrosis visualized. Left Kidney: Renal measurements: 11.2 x 6.0 x 5.7 cm = volume: 202 mL. Echogenicity within normal limits. No mass or hydronephrosis visualized. Bladder: Appears normal for degree of bladder distention. Other: None. IMPRESSION:  Normal renal ultrasound. Electronically Signed   By: Lajean Manes M.D.   On: 01/22/2020 14:46   DG Chest Portable 1 View  Result Date: 02/10/2020 CLINICAL DATA:  Breast carcinoma with recent blood transfusion. Concern for potential sepsis EXAM: PORTABLE CHEST 1 VIEW COMPARISON:  December 19, 2019 FINDINGS: Port-A-Cath tip is in the superior vena cava. No pneumothorax. The lungs are clear. The heart is mildly enlarged with pulmonary vascularity normal. No adenopathy. There are surgical clips in the left axillary region. IMPRESSION: Port-A-Cath as described without pneumothorax. Lungs clear. Mild cardiomegaly. No evident adenopathy. Electronically Signed   By: Lowella Grip III M.D.   On: 02/10/2020 09:53   DG Foot 2 Views Left  Result Date: 02/11/2020 CLINICAL DATA:  Soft tissue swelling and warmth EXAM: LEFT FOOT - 2 VIEW COMPARISON:  November 04, 2018 FINDINGS: Frontal and lateral views were obtained. There is marked soft tissue swelling. No evident fracture or dislocation. There is degenerative change with narrowing of the first MTP joint. Other joint spaces appear unremarkable. There is a posterior calcaneal spur. No erosive change or bony destruction. Bones appear somewhat osteoporotic. IMPRESSION: Marked soft tissue swelling. No bony destruction or erosion. Narrowing first MTP joint. Other joint spaces appear unremarkable. There is a posterior calcaneal spur. Electronically Signed   By: Lowella Grip III M.D.   On: 02/11/2020 10:18   ECHOCARDIOGRAM COMPLETE  Result Date: 02/11/2020    ECHOCARDIOGRAM REPORT   Patient Name:   VEE BAHE Date of Exam: 02/11/2020 Medical Rec #:  [id number]     Height:       61.0 in Accession #:    [id number]    Weight:       307.0 lb Date of Birth:  Apr 26, 1956    BSA:          2.266 m Patient Age:    64 years      BP:           164/71 mmHg Patient Gender: F  HR:           123 bpm. Exam Location:  Inpatient Procedure: 2D Echo, Cardiac Doppler, Color  Doppler and Intracardiac            Opacification Agent Indications:    Hypertensive emergency  History:        Patient has prior history of Echocardiogram examinations, most                 recent 01/22/2020. Risk Factors:Hypertension and Non-Smoker.                 Breast cancer.  Sonographer:    Vickie Epley RDCS Referring Phys: [id number] [place]  1. Left ventricular ejection fraction, by estimation, is 70 to 75%. The left ventricle has hyperdynamic function. The left ventricle has no regional wall motion abnormalities. Left ventricular diastolic parameters are consistent with Grade I diastolic dysfunction (impaired relaxation). Elevated left ventricular end-diastolic pressure.  2. Right ventricular systolic function is normal. The right ventricular size is normal. Tricuspid regurgitation signal is inadequate for assessing PA pressure.  3. The mitral valve is normal in structure. No evidence of mitral valve regurgitation. No evidence of mitral stenosis.  4. The aortic valve is normal in structure. Aortic valve regurgitation is not visualized. No aortic stenosis is present.  5. The inferior vena cava is normal in size with greater than 50% respiratory variability, suggesting right atrial pressure of 3 mmHg. FINDINGS  Left Ventricle: Left ventricular ejection fraction, by estimation, is 70 to 75%. The left ventricle has hyperdynamic function. The left ventricle has no regional wall motion abnormalities. Definity contrast agent was given IV to delineate the left ventricular endocardial borders. The left ventricular internal cavity size was normal in size. There is no left ventricular hypertrophy. Left ventricular diastolic parameters are consistent with Grade I diastolic dysfunction (impaired relaxation). Elevated left ventricular end-diastolic pressure. Right Ventricle: The right ventricular size is normal. No increase in right ventricular wall thickness. Right ventricular systolic function is  normal. Tricuspid regurgitation signal is inadequate for assessing PA pressure. Left Atrium: Left atrial size was normal in size. Right Atrium: Right atrial size was normal in size. Pericardium: There is no evidence of pericardial effusion. Mitral Valve: The mitral valve is normal in structure. Normal mobility of the mitral valve leaflets. No evidence of mitral valve regurgitation. No evidence of mitral valve stenosis. Tricuspid Valve: The tricuspid valve is normal in structure. Tricuspid valve regurgitation is trivial. No evidence of tricuspid stenosis. Aortic Valve: The aortic valve is normal in structure. Aortic valve regurgitation is not visualized. No aortic stenosis is present. Pulmonic Valve: The pulmonic valve was normal in structure. Pulmonic valve regurgitation is not visualized. No evidence of pulmonic stenosis. Aorta: The aortic root is normal in size and structure. Venous: The inferior vena cava is normal in size with greater than 50% respiratory variability, suggesting right atrial pressure of 3 mmHg. IAS/Shunts: No atrial level shunt detected by color flow Doppler.  LEFT VENTRICLE PLAX 2D LVIDd:         3.80 cm  Diastology LVIDs:         2.30 cm  LV e' lateral:   8.49 cm/s LV PW:         0.80 cm  LV E/e' lateral: 13.9 LV IVS:        0.80 cm  LV e' medial:    7.18 cm/s LVOT diam:     1.70 cm  LV E/e' medial:  16.4 LV  SV:         51 LV SV Index:   23 LVOT Area:     2.27 cm  RIGHT VENTRICLE RV S prime:     16.40 cm/s TAPSE (M-mode): 1.9 cm LEFT ATRIUM             Index       RIGHT ATRIUM           Index LA diam:        3.30 cm 1.46 cm/m  RA Area:     12.60 cm LA Vol (A2C):   39.3 ml 17.34 ml/m RA Volume:   27.30 ml  12.05 ml/m LA Vol (A4C):   38.0 ml 16.77 ml/m LA Biplane Vol: 41.6 ml 18.36 ml/m  AORTIC VALVE LVOT Vmax:   170.00 cm/s LVOT Vmean:  121.000 cm/s LVOT VTI:    0.225 m  AORTA Ao Root diam: 2.60 cm MITRAL VALVE MV Area (PHT): 5.75 cm     SHUNTS MV Decel Time: 132 msec     Systemic VTI:   0.22 m MV E velocity: 118.00 cm/s  Systemic Diam: 1.70 cm MV A velocity: 159.00 cm/s MV E/A ratio:  0.74 Fransico Him MD Electronically signed by Fransico Him MD Signature Date/Time: 02/11/2020/9:20:01 AM    Final    VAS [place] LOWER EXTREMITY VENOUS (DVT) (ONLY [facility] & [facility])  Result Date: 02/10/2020  Lower Venous DVTStudy Indications: Swelling.  Limitations: Body habitus. Comparison Study: 11/04/18 negative Performing Technologist: June Leap RDMS, RVT  Examination Guidelines: A complete evaluation includes B-mode imaging, spectral Doppler, color Doppler, and power Doppler as needed of all accessible portions of each vessel. Bilateral testing is considered an integral part of a complete examination. Limited examinations for reoccurring indications may be performed as noted. The reflux portion of the exam is performed with the patient in reverse Trendelenburg.  +---------+---------------+---------+-----------+----------+--------------+ LEFT     CompressibilityPhasicitySpontaneityPropertiesThrombus Aging +---------+---------------+---------+-----------+----------+--------------+ CFV      Full           Yes      Yes                                 +---------+---------------+---------+-----------+----------+--------------+ SFJ      Full                                                        +---------+---------------+---------+-----------+----------+--------------+ FV Prox  Full                                                        +---------+---------------+---------+-----------+----------+--------------+ FV Mid   Full                                                        +---------+---------------+---------+-----------+----------+--------------+ FV DistalFull                                                        +---------+---------------+---------+-----------+----------+--------------+  PFV                                                   Not visualized  +---------+---------------+---------+-----------+----------+--------------+ POP      Full           Yes      Yes                                 +---------+---------------+---------+-----------+----------+--------------+ PTV      Full                                                        +---------+---------------+---------+-----------+----------+--------------+ PERO                                                  Not visualized +---------+---------------+---------+-----------+----------+--------------+     Summary: LEFT: - There is no evidence of deep vein thrombosis in the lower extremity. However, portions of this examination were limited- see technologist comments above.  *See table(s) above for measurements and observations. Electronically signed by Monica Martinez MD on 02/10/2020 at 11:15:19 AM.    Final    ECHOCARDIOGRAM LIMITED  Result Date: 01/22/2020    ECHOCARDIOGRAM LIMITED REPORT   Patient Name:   JAYMA VOLPI Date of Exam: 01/22/2020 Medical Rec #:  [id number]     Height:       61.0 in Accession #:    [id number]    Weight:       303.9 lb Date of Birth:  30-Jun-1956    BSA:          2.256 m Patient Age:    62 years      BP:           92/60 mmHg Patient Gender: F             HR:           73 bpm. Exam Location:  Inpatient Procedure: Limited Echo Indications:    Pericardial effusion  History:        Patient has prior history of Echocardiogram examinations, most                 recent 11/26/2019. Risk Factors:Hypertension. Breast cancer, s/p                 mastectomy.  Sonographer:    Dustin Flock Referring Phys: [place]  1. Left ventricular ejection fraction, by estimation, is 65 to 70%. The left ventricle has normal function. The left ventricle has no regional wall motion abnormalities. There is mild left ventricular hypertrophy.  2. Right ventricular systolic function is normal. The right ventricular size is normal.  3. The inferior vena cava is  normal in size with greater than 50% respiratory variability, suggesting right atrial pressure of 3 mmHg.  4. No pericardial effusion.  5. Limited echo with no doppler. FINDINGS  Left Ventricle: Left ventricular ejection fraction, by estimation, is 65 to 70%. The left ventricle  has normal function. The left ventricle has no regional wall motion abnormalities. The left ventricular internal cavity size was normal in size. There is  mild left ventricular hypertrophy. Right Ventricle: The right ventricular size is normal. No increase in right ventricular wall thickness. Right ventricular systolic function is normal. Left Atrium: Left atrial size was normal in size. Right Atrium: Right atrial size was normal in size. Pericardium: There is no evidence of pericardial effusion. Venous: The inferior vena cava is normal in size with greater than 50% respiratory variability, suggesting right atrial pressure of 3 mmHg. IAS/Shunts: No atrial level shunt detected by color flow Doppler.  LEFT VENTRICLE PLAX 2D LVIDd:         3.30 cm LVIDs:         1.80 cm LV PW:         1.50 cm LV IVS:        1.30 cm  Loralie Champagne MD Electronically signed by Loralie Champagne MD Signature Date/Time: 01/22/2020/3:19:05 PM    Final      ELIGIBLE FOR AVAILABLE RESEARCH PROTOCOL: AET  ASSESSMENT: 64 y.o. [place] woman status post left breast upper outer quadrant and left axillary lymph node biopsies 11/12/2019 for a clinical T2 N2, stage IIIA invasive ductal carcinoma, grade 3, estrogen and progesterone receptor positive, HER-2 not amplified, with an MIB-1 of 70%  (a) the biopsied axillary lymph node was positive  (1) genetics testing 11/29/2019 through the Breast Cancer STAT Panel offered by [facility] found no deleterious mutations in ATM, BRCA1, BRCA2, CDH1, CHEK2, PALB2, PTEN, STK11 and TP53.  Additional testing through the Common Hereditary Cancers Panel confirmed no deleterious mutations in APC, ATM, AXIN2, BARD1, BMPR1A, BRCA1, BRCA2,  BRIP1, CDH1, CDK4, CDKN2A (p14ARF), CDKN2A (p16INK4a), CHEK2, CTNNA1, DICER1, EPCAM (Deletion/duplication testing only), GREM1 (promoter region deletion/duplication testing only), KIT, MEN1, MLH1, MSH2, MSH3, MSH6, MUTYH, NBN, NF1, NHTL1, PALB2, PDGFRA, PMS2, POLD1, POLE, PTEN, RAD50, RAD51C, RAD51D, RNF43, SDHB, SDHC, SDHD, SMAD4, SMARCA4. STK11, TP53, TSC1, TSC2, and VHL.  The following genes were evaluated for sequence changes only: SDHA and HOXB13 c.251G>A variant only.  (2) status post left modified radical mastectomy 12/19/2019 for a pT2 pN1, stage IIB invasive ductal carcinoma, grade 3, with negative margins.  (a) 1 of 13 axillary lymph nodes removed was positive  (3) adjuvant chemotherapy will consist of cyclophosphamide and doxorubicin in dose dense fashion x4 starting 01/22/2020 followed by weekly paclitaxel x12  (a) baseline echocardiogram 11/26/2019 shows an ejection fraction in the 65-70% range.  (b) first cycle of cyclophosphamide and doxorubicin dose reduced to assess tolerance  (4) adjuvant radiation as appropriate  (5) antiestrogens to follow   PLAN: Stevi  has recovered remarkably well from her first treatment and we will proceed with therapy today.  She will receive the doxorubicin at 50/m and the Cytoxan at 500/m.  She will receive Neulasta in 3 days.  We will continue to support her counts and she will receive packed red cells today.  She will see [place] again in a week to check her nadir counts and make sure she does not need other support.  Otherwise she will see me again in 2 weeks with cycle #3.  Total encounter time 20 minutes.Sarajane Jews C. Aviyah Swetz, MD 02/20/2020 10:17 PM Medical Oncology and Hematology [facility] [place], [place] [address] Tel. [phone]    Fax. [phone]   This document serves as a record of services personally performed by Lurline Del, MD. It  was created on his behalf by Wilburn Mylar, a trained  medical scribe. The creation of this record is based on the scribe's personal observations and the provider's statements to them.   I, Lurline Del MD, have reviewed the above documentation for accuracy and completeness, and I agree with the above.   *Total Encounter Time as defined by the Centers for Medicare and Medicaid Services includes, in addition to the face-to-face time of a patient visit (documented in the note above) non-face-to-face time: obtaining and reviewing outside history, ordering and reviewing medications, tests or procedures, care coordination (communications with other health care professionals or caregivers) and documentation in the medical record.

## 2020-02-19 ENCOUNTER — Inpatient Hospital Stay (HOSPITAL_BASED_OUTPATIENT_CLINIC_OR_DEPARTMENT_OTHER): Payer: Medicaid Other | Admitting: Oncology

## 2020-02-19 ENCOUNTER — Other Ambulatory Visit: Payer: Self-pay

## 2020-02-19 ENCOUNTER — Inpatient Hospital Stay: Payer: Medicaid Other

## 2020-02-19 ENCOUNTER — Encounter: Payer: Self-pay | Admitting: *Deleted

## 2020-02-19 ENCOUNTER — Other Ambulatory Visit: Payer: Self-pay | Admitting: *Deleted

## 2020-02-19 VITALS — BP 131/84 | HR 77 | Temp 97.9°F | Resp 20 | Ht 61.0 in | Wt 315.5 lb

## 2020-02-19 DIAGNOSIS — Z170 Estrogen receptor positive status [ER+]: Secondary | ICD-10-CM

## 2020-02-19 DIAGNOSIS — C50412 Malignant neoplasm of upper-outer quadrant of left female breast: Secondary | ICD-10-CM

## 2020-02-19 DIAGNOSIS — Z8041 Family history of malignant neoplasm of ovary: Secondary | ICD-10-CM | POA: Diagnosis not present

## 2020-02-19 DIAGNOSIS — D649 Anemia, unspecified: Secondary | ICD-10-CM

## 2020-02-19 DIAGNOSIS — M1990 Unspecified osteoarthritis, unspecified site: Secondary | ICD-10-CM | POA: Diagnosis not present

## 2020-02-19 DIAGNOSIS — Z7289 Other problems related to lifestyle: Secondary | ICD-10-CM | POA: Diagnosis not present

## 2020-02-19 DIAGNOSIS — R339 Retention of urine, unspecified: Secondary | ICD-10-CM | POA: Diagnosis not present

## 2020-02-19 DIAGNOSIS — I10 Essential (primary) hypertension: Secondary | ICD-10-CM

## 2020-02-19 DIAGNOSIS — Z79899 Other long term (current) drug therapy: Secondary | ICD-10-CM | POA: Diagnosis not present

## 2020-02-19 DIAGNOSIS — N3289 Other specified disorders of bladder: Secondary | ICD-10-CM | POA: Diagnosis not present

## 2020-02-19 DIAGNOSIS — Z8249 Family history of ischemic heart disease and other diseases of the circulatory system: Secondary | ICD-10-CM | POA: Diagnosis not present

## 2020-02-19 DIAGNOSIS — C773 Secondary and unspecified malignant neoplasm of axilla and upper limb lymph nodes: Secondary | ICD-10-CM | POA: Diagnosis not present

## 2020-02-19 DIAGNOSIS — Z833 Family history of diabetes mellitus: Secondary | ICD-10-CM | POA: Diagnosis not present

## 2020-02-19 DIAGNOSIS — N179 Acute kidney failure, unspecified: Secondary | ICD-10-CM | POA: Diagnosis not present

## 2020-02-19 DIAGNOSIS — I517 Cardiomegaly: Secondary | ICD-10-CM | POA: Diagnosis not present

## 2020-02-19 DIAGNOSIS — Z6841 Body Mass Index (BMI) 40.0 and over, adult: Secondary | ICD-10-CM

## 2020-02-19 DIAGNOSIS — Z823 Family history of stroke: Secondary | ICD-10-CM | POA: Diagnosis not present

## 2020-02-19 DIAGNOSIS — I313 Pericardial effusion (noninflammatory): Secondary | ICD-10-CM | POA: Diagnosis not present

## 2020-02-19 DIAGNOSIS — Z95828 Presence of other vascular implants and grafts: Secondary | ICD-10-CM

## 2020-02-19 DIAGNOSIS — Z5111 Encounter for antineoplastic chemotherapy: Secondary | ICD-10-CM | POA: Diagnosis not present

## 2020-02-19 LAB — CBC WITH DIFFERENTIAL/PLATELET
Abs Immature Granulocytes: 2.63 10*3/uL — ABNORMAL HIGH (ref 0.00–0.07)
Basophils Absolute: 0.1 10*3/uL (ref 0.0–0.1)
Basophils Relative: 0 %
Eosinophils Absolute: 0 10*3/uL (ref 0.0–0.5)
Eosinophils Relative: 0 %
HCT: 21.3 % — ABNORMAL LOW (ref 36.0–46.0)
Hemoglobin: 7 g/dL — ABNORMAL LOW (ref 12.0–15.0)
Immature Granulocytes: 17 %
Lymphocytes Relative: 12 %
Lymphs Abs: 1.7 10*3/uL (ref 0.7–4.0)
MCH: 31.3 pg (ref 26.0–34.0)
MCHC: 32.9 g/dL (ref 30.0–36.0)
MCV: 95.1 fL (ref 80.0–100.0)
Monocytes Absolute: 1.4 10*3/uL — ABNORMAL HIGH (ref 0.1–1.0)
Monocytes Relative: 9 %
Neutro Abs: 9.4 10*3/uL — ABNORMAL HIGH (ref 1.7–7.7)
Neutrophils Relative %: 62 %
Platelets: 255 10*3/uL (ref 150–400)
RBC: 2.24 MIL/uL — ABNORMAL LOW (ref 3.87–5.11)
RDW: 15.8 % — ABNORMAL HIGH (ref 11.5–15.5)
WBC: 15.2 10*3/uL — ABNORMAL HIGH (ref 4.0–10.5)
nRBC: 0.9 % — ABNORMAL HIGH (ref 0.0–0.2)

## 2020-02-19 LAB — COMPREHENSIVE METABOLIC PANEL
ALT: 25 U/L (ref 0–44)
AST: 33 U/L (ref 15–41)
Albumin: 2.9 g/dL — ABNORMAL LOW (ref 3.5–5.0)
Alkaline Phosphatase: 65 U/L (ref 38–126)
Anion gap: 15 (ref 5–15)
BUN: 18 mg/dL (ref 8–23)
CO2: 18 mmol/L — ABNORMAL LOW (ref 22–32)
Calcium: 8.2 mg/dL — ABNORMAL LOW (ref 8.9–10.3)
Chloride: 104 mmol/L (ref 98–111)
Creatinine, Ser: 1.76 mg/dL — ABNORMAL HIGH (ref 0.44–1.00)
GFR calc Af Amer: 35 mL/min — ABNORMAL LOW (ref >60)
GFR calc non Af Amer: 30 mL/min — ABNORMAL LOW (ref >60)
Glucose, Bld: 113 mg/dL — ABNORMAL HIGH (ref 70–99)
Potassium: 3.9 mmol/L (ref 3.5–5.1)
Sodium: 137 mmol/L (ref 135–145)
Total Bilirubin: 0.4 mg/dL (ref 0.3–1.2)
Total Protein: 6.6 g/dL (ref 6.5–8.1)

## 2020-02-19 LAB — PREPARE RBC (CROSSMATCH)

## 2020-02-19 MED ORDER — ACETAMINOPHEN 325 MG PO TABS
ORAL_TABLET | ORAL | Status: AC
  Filled 2020-02-19: qty 2

## 2020-02-19 MED ORDER — SODIUM CHLORIDE 0.9 % IV SOLN
Freq: Once | INTRAVENOUS | Status: DC
  Filled 2020-02-19: qty 250

## 2020-02-19 MED ORDER — SODIUM CHLORIDE 0.9% FLUSH
10.0000 mL | INTRAVENOUS | Status: DC | PRN
  Administered 2020-02-19: 10 mL
  Filled 2020-02-19: qty 10

## 2020-02-19 MED ORDER — SODIUM CHLORIDE 0.9 % IV SOLN
Freq: Once | INTRAVENOUS | Status: AC
  Filled 2020-02-19: qty 250

## 2020-02-19 MED ORDER — HEPARIN SOD (PORK) LOCK FLUSH 100 UNIT/ML IV SOLN
500.0000 [IU] | Freq: Once | INTRAVENOUS | Status: AC | PRN
  Administered 2020-02-19: 500 [IU]
  Filled 2020-02-19: qty 5

## 2020-02-19 MED ORDER — FUROSEMIDE 10 MG/ML IJ SOLN
INTRAMUSCULAR | Status: AC
  Filled 2020-02-19: qty 2

## 2020-02-19 MED ORDER — DIPHENHYDRAMINE HCL 25 MG PO CAPS
25.0000 mg | ORAL_CAPSULE | Freq: Once | ORAL | Status: AC
  Administered 2020-02-19: 25 mg via ORAL

## 2020-02-19 MED ORDER — SODIUM CHLORIDE 0.9% IV SOLUTION
250.0000 mL | Freq: Once | INTRAVENOUS | Status: AC
  Administered 2020-02-19: 250 mL via INTRAVENOUS
  Filled 2020-02-19: qty 250

## 2020-02-19 MED ORDER — SODIUM CHLORIDE 0.9 % IV SOLN
150.0000 mg | Freq: Once | INTRAVENOUS | Status: AC
  Administered 2020-02-19: 150 mg via INTRAVENOUS
  Filled 2020-02-19: qty 150

## 2020-02-19 MED ORDER — FUROSEMIDE 10 MG/ML IJ SOLN
10.0000 mg | Freq: Once | INTRAMUSCULAR | Status: AC
  Administered 2020-02-19: 10 mg via INTRAVENOUS

## 2020-02-19 MED ORDER — PALONOSETRON HCL INJECTION 0.25 MG/5ML
0.2500 mg | Freq: Once | INTRAVENOUS | Status: AC
  Administered 2020-02-19: 0.25 mg via INTRAVENOUS

## 2020-02-19 MED ORDER — SODIUM CHLORIDE 0.9 % IV SOLN
10.0000 mg | Freq: Once | INTRAVENOUS | Status: AC
  Administered 2020-02-19: 10 mg via INTRAVENOUS
  Filled 2020-02-19: qty 10

## 2020-02-19 MED ORDER — SODIUM CHLORIDE 0.9 % IV SOLN
500.0000 mg/m2 | Freq: Once | INTRAVENOUS | Status: AC
  Administered 2020-02-19: 1240 mg via INTRAVENOUS
  Filled 2020-02-19: qty 62

## 2020-02-19 MED ORDER — SODIUM CHLORIDE 0.9% FLUSH
10.0000 mL | Freq: Once | INTRAVENOUS | Status: AC
  Administered 2020-02-19: 10 mL
  Filled 2020-02-19: qty 10

## 2020-02-19 MED ORDER — DIPHENHYDRAMINE HCL 25 MG PO CAPS
ORAL_CAPSULE | ORAL | Status: AC
  Filled 2020-02-19: qty 1

## 2020-02-19 MED ORDER — DOXORUBICIN HCL CHEMO IV INJECTION 2 MG/ML
50.0000 mg/m2 | Freq: Once | INTRAVENOUS | Status: AC
  Administered 2020-02-19: 124 mg via INTRAVENOUS
  Filled 2020-02-19: qty 62

## 2020-02-19 MED ORDER — ACETAMINOPHEN 325 MG PO TABS
650.0000 mg | ORAL_TABLET | Freq: Once | ORAL | Status: AC
  Administered 2020-02-19: 650 mg via ORAL

## 2020-02-19 MED ORDER — PALONOSETRON HCL INJECTION 0.25 MG/5ML
INTRAVENOUS | Status: AC
  Filled 2020-02-19: qty 5

## 2020-02-19 NOTE — Patient Instructions (Addendum)
[facility] Discharge Instructions for Patients Receiving Chemotherapy  Today you received the following chemotherapy agents: Doxorubicin (Adriamycin), and Cytoxan  To help prevent nausea and vomiting after your treatment, we encourage you to take your nausea medication as directed by your MD.   If you develop nausea and vomiting that is not controlled by your nausea medication, call the clinic.   BELOW ARE SYMPTOMS THAT SHOULD BE REPORTED IMMEDIATELY:  *FEVER GREATER THAN 100.5 F  *CHILLS WITH OR WITHOUT FEVER  NAUSEA AND VOMITING THAT IS NOT CONTROLLED WITH YOUR NAUSEA MEDICATION  *UNUSUAL SHORTNESS OF BREATH  *UNUSUAL BRUISING OR BLEEDING  TENDERNESS IN MOUTH AND THROAT WITH OR WITHOUT PRESENCE OF ULCERS  *URINARY PROBLEMS  *BOWEL PROBLEMS  UNUSUAL RASH Items with * indicate a potential emergency and should be followed up as soon as possible.  Feel free to call the clinic should you have any questions or concerns. The clinic phone number is [phone].  Please show the [facility] at check-in to the Emergency Department and triage nurse.    Rehydration, Adult Rehydration is the replacement of body fluids and salts and minerals (electrolytes) that are lost during dehydration. Dehydration is when there is not enough fluid or water in the body. This happens when you lose more fluids than you take in. Common causes of dehydration include:  Vomiting.  Diarrhea.  Excessive sweating, such as from heat exposure or exercise.  Taking medicines that cause the body to lose excess fluid (diuretics).  Impaired kidney function.  Not drinking enough fluid.  Certain illnesses or infections.  Certain poorly controlled long-term (chronic) illnesses, such as diabetes, heart disease, and kidney disease.  Symptoms of mild dehydration may include thirst, dry lips and mouth, dry skin, and dizziness. Symptoms of severe dehydration may include increased heart  rate, confusion, fainting, and not urinating. You can rehydrate by drinking certain fluids or getting fluids through an IV tube, as told by your health care provider. What are the risks? Generally, rehydration is safe. However, one problem that can happen is taking in too much fluid (overhydration). This is rare. If overhydration happens, it can cause an electrolyte imbalance, kidney failure, or a decrease in salt (sodium) levels in the body. How to rehydrate Follow instructions from your health care provider for rehydration. The kind of fluid you should drink and the amount you should drink depend on your condition.  If directed by your health care provider, drink an oral rehydration solution (ORS). This is a drink designed to treat dehydration that is found in pharmacies and retail stores. ? Make an ORS by following instructions on the package. ? Start by drinking small amounts, about  cup (120 mL) every 5-10 minutes. ? Slowly increase how much you drink until you have taken the amount recommended by your health care provider.  Drink enough clear fluids to keep your urine clear or pale yellow. If you were instructed to drink an ORS, finish the ORS first, then start slowly drinking other clear fluids. Drink fluids such as: ? Water. Do not drink only water. Doing that can lead to having too little sodium in your body (hyponatremia). ? Ice chips. ? Fruit juice that you have added water to (diluted juice). ? Low-calorie sports drinks.  If you are severely dehydrated, your health care provider may recommend that you receive fluids through an IV tube in the hospital.  Do not take sodium tablets. Doing that can lead to the condition of having too  much sodium in your body (hypernatremia). Eating while you rehydrate Follow instructions from your health care provider about what to eat while you rehydrate. Your health care provider may recommend that you slowly begin eating regular foods in small  amounts.  Eat foods that contain a healthy balance of electrolytes, such as bananas, oranges, potatoes, tomatoes, and spinach.  Avoid foods that are greasy or contain a lot of fat or sugar.  In some cases, you may get nutrition through a feeding tube that is passed through your nose and into your stomach (nasogastric tube, or NG tube). This may be done if you have uncontrolled vomiting or diarrhea. Beverages to avoid Certain beverages may make dehydration worse. While you rehydrate, avoid:  Alcohol.  Caffeine.  Drinks that contain a lot of sugar. These include: ? High-calorie sports drinks. ? Fruit juice that is not diluted. ? Soda.  Check nutrition labels to see how much sugar or caffeine a beverage contains. Signs of dehydration recovery You may be recovering from dehydration if:  You are urinating more often than before you started rehydrating.  Your urine is clear or pale yellow.  Your energy level improves.  You vomit less frequently.  You have diarrhea less frequently.  Your appetite improves or returns to normal.  You feel less dizzy or less light-headed.  Your skin tone and color start to look more normal. Contact a health care provider if:  You continue to have symptoms of mild dehydration, such as: ? Thirst. ? Dry lips. ? Slightly dry mouth. ? Dry, warm skin. ? Dizziness.  You continue to vomit or have diarrhea. Get help right away if:  You have symptoms of dehydration that get worse.  You feel: ? Confused. ? Weak. ? Like you are going to faint.  You have not urinated in 6-8 hours.  You have very dark urine.  You have trouble breathing.  Your heart rate while sitting still is over 100 beats a minute.  You cannot drink fluids without vomiting.  You have vomiting or diarrhea that: ? Gets worse. ? Does not go away.  You have a fever. This information is not intended to replace advice given to you by your health care provider. Make sure  you discuss any questions you have with your health care provider. Document Revised: 07/29/2017 Document Reviewed: 10/10/2015 Elsevier Patient Education  2020 [place].    Blood Transfusion, Adult A blood transfusion is a procedure in which you receive blood through an IV tube. You may need this procedure because of:  A bleeding disorder.  An illness.  An injury.  A surgery. The blood may come from someone else (a donor). You may also be able to donate blood for yourself. The blood given in a transfusion is made up of different types of cells. You may get:  Red blood cells. These carry oxygen to the cells in the body.  White blood cells. These help you fight infections.  Platelets. These help your blood to clot.  Plasma. This is the liquid part of your blood. It carries proteins and other substances through the body. If you have a clotting disorder, you may also get other types of blood products. Tell your doctor about:  Any blood disorders you have.  Any reactions you have had during a blood transfusion in the past.  Any allergies you have.  All medicines you are taking, including vitamins, herbs, eye drops, creams, and over-the-counter medicines.  Any surgeries you have had.  Any  medical conditions you have. This includes any recent fever or cold symptoms.  Whether you are pregnant or may be pregnant. What are the risks? Generally, this is a safe procedure. However, problems may occur.  The most common problems include: ? A mild allergic reaction. This includes red, swollen areas of skin (hives) and itching. ? Fever or chills. This may be the body's response to new blood cells received. This may happen during or up to 4 hours after the transfusion.  More serious problems may include: ? Too much fluid in the lungs. This may cause breathing problems. ? A serious allergic reaction. This includes breathing trouble or swelling around the face and lips. ? Lung  injury. This causes breathing trouble and low oxygen in the blood. This can happen within hours of the transfusion or days later. ? Too much iron. This can happen after getting many blood transfusions over a period of time. ? An infection or virus passed through the blood. This is rare. Donated blood is carefully tested before it is given. ? Your body's defense system (immune system) trying to attack the new blood cells. This is rare. Symptoms may include fever, chills, nausea, low blood pressure, and low back or chest pain. ? Donated cells attacking healthy tissues. This is rare. What happens before the procedure? Medicines Ask your doctor about:  Changing or stopping your normal medicines. This is important.  Taking aspirin and ibuprofen. Do not take these medicines unless your doctor tells you to take them.  Taking over-the-counter medicines, vitamins, herbs, and supplements. General instructions  Follow instructions from your doctor about what you cannot eat or drink.  You will have a blood test to find out your blood type. The test also finds out what type of blood your body will accept and matches it to the donor type.  If you are going to have a planned surgery, you may be able to donate your own blood. This may be done in case you need a transfusion.  You will have your temperature, blood pressure, and pulse checked.  You may receive medicine to help prevent an allergic reaction. This may be done if you have had a reaction to a transfusion before. This medicine may be given to you by mouth or through an IV tube.  This procedure lasts about 1-4 hours. Plan for the time you need. What happens during the procedure?   An IV tube will be put into one of your veins.  The bag of donated blood will be attached to your IV tube. Then, the blood will enter through your vein.  Your temperature, blood pressure, and pulse will be checked often. This is done to find early signs of a  transfusion reaction.  Tell your nurse right away if you have any of these symptoms: ? Shortness of breath or trouble breathing. ? Chest or back pain. ? Fever or chills. ? Red, swollen areas of skin or itching.  If you have any signs or symptoms of a reaction, your transfusion will be stopped. You may also be given medicine.  When the transfusion is finished, your IV tube will be taken out.  Pressure may be put on the IV site for a few minutes.  A bandage (dressing) will be put on the IV site. The procedure may vary among doctors and hospitals. What happens after the procedure?  You will be monitored until you leave the hospital or clinic. This includes checking your temperature, blood pressure, pulse, breathing rate,  and blood oxygen level.  Your blood may be tested to see how you are responding to the transfusion.  You may be warmed with fluids or blankets. This is done to keep the temperature of your body normal.  If you have your procedure in an outpatient setting, you will be told whom to contact to report any reactions. Where to find more information To learn more, visit the [employer]: redcross.org Summary  A blood transfusion is a procedure in which you are given blood through an IV tube.  The blood may come from someone else (a donor). You may also be able to donate blood for yourself.  The blood you are given is made up of different blood cells. You may receive red blood cells, platelets, plasma, or white blood cells.  Your temperature, blood pressure, and pulse will be checked often.  After the procedure, your blood may be tested to see how you are responding. This information is not intended to replace advice given to you by your health care provider. Make sure you discuss any questions you have with your health care provider. Document Revised: 02/08/2019 Document Reviewed: 02/08/2019 Elsevier Patient Education  [place].

## 2020-02-19 NOTE — Progress Notes (Signed)
Per Dr. Jana Hakim ok for treatment today with Hgb 7 and Creatine 1.76 Blood return noted before, during, and after Adriamycin IV push.

## 2020-02-20 ENCOUNTER — Telehealth: Payer: Self-pay | Admitting: Family Medicine

## 2020-02-20 ENCOUNTER — Ambulatory Visit: Payer: Medicaid Other

## 2020-02-20 ENCOUNTER — Telehealth: Payer: Self-pay | Admitting: Oncology

## 2020-02-20 LAB — BPAM RBC
Blood Product Expiration Date: 202107062359
Blood Product Expiration Date: 202107062359
ISSUE DATE / TIME: 202106221118
ISSUE DATE / TIME: 202106221118
Unit Type and Rh: 6200
Unit Type and Rh: 6200

## 2020-02-20 LAB — TYPE AND SCREEN
ABO/RH(D): A POS
Antibody Screen: NEGATIVE
Unit division: 0
Unit division: 0

## 2020-02-20 NOTE — Telephone Encounter (Signed)
Call returned to the patient. She wanted to schedule an appointment at [facility] - appt scheduled for 03/10/2020

## 2020-02-20 NOTE — Telephone Encounter (Signed)
Patient called in and requested for a call regarding her appointment at the cancer center.

## 2020-02-20 NOTE — Telephone Encounter (Signed)
No 6/22 los. No changes made to pt's schedule.

## 2020-02-21 ENCOUNTER — Ambulatory Visit: Payer: Medicaid Other

## 2020-02-21 ENCOUNTER — Inpatient Hospital Stay: Payer: Medicaid Other

## 2020-02-21 ENCOUNTER — Other Ambulatory Visit: Payer: Self-pay

## 2020-02-21 ENCOUNTER — Encounter: Payer: Self-pay | Admitting: Licensed Clinical Social Worker

## 2020-02-21 VITALS — BP 155/92 | HR 85 | Temp 97.5°F | Resp 24

## 2020-02-21 DIAGNOSIS — Z5111 Encounter for antineoplastic chemotherapy: Secondary | ICD-10-CM | POA: Diagnosis not present

## 2020-02-21 DIAGNOSIS — C50412 Malignant neoplasm of upper-outer quadrant of left female breast: Secondary | ICD-10-CM

## 2020-02-21 DIAGNOSIS — Z170 Estrogen receptor positive status [ER+]: Secondary | ICD-10-CM

## 2020-02-21 MED ORDER — PEGFILGRASTIM-JMDB 6 MG/0.6ML ~~LOC~~ SOSY
6.0000 mg | PREFILLED_SYRINGE | Freq: Once | SUBCUTANEOUS | Status: AC
  Administered 2020-02-21: 6 mg via SUBCUTANEOUS

## 2020-02-21 MED ORDER — SODIUM CHLORIDE 0.9% FLUSH
10.0000 mL | Freq: Once | INTRAVENOUS | Status: AC
  Administered 2020-02-21: 10 mL via INTRAVENOUS
  Filled 2020-02-21: qty 10

## 2020-02-21 MED ORDER — HEPARIN SOD (PORK) LOCK FLUSH 100 UNIT/ML IV SOLN
500.0000 [IU] | Freq: Once | INTRAVENOUS | Status: AC
  Administered 2020-02-21: 500 [IU] via INTRAVENOUS
  Filled 2020-02-21: qty 5

## 2020-02-21 MED ORDER — SODIUM CHLORIDE 0.9 % IV SOLN
INTRAVENOUS | Status: DC
  Filled 2020-02-21: qty 250

## 2020-02-21 MED ORDER — PEGFILGRASTIM-JMDB 6 MG/0.6ML ~~LOC~~ SOSY
PREFILLED_SYRINGE | SUBCUTANEOUS | Status: AC
  Filled 2020-02-21: qty 0.6

## 2020-02-21 NOTE — Progress Notes (Signed)
[facility] CSW Progress Note  Clinical Education officer, museum contacted patient by phone to confirm need for personal care services after receiving request from RN at her PCP's office. Patient confirmed that she will need help at home as her sister, Manus Gunning, was previously helping but needs to go back to work. Patient is familiar with [employer] services and [employer]. CSW started referral form and forwarded to RN Val to help complete and fax in with MD signature.    Edwinna Areola Maritssa Haughton , LCSW

## 2020-02-21 NOTE — Telephone Encounter (Signed)
This CM spoke to Edwyna Shell, LCSW regarding need for [facility] referral as the patient is requesting help with personal care. She stated that she will follow up with the oncology SW /oncologist regarding this request.

## 2020-02-21 NOTE — Patient Instructions (Signed)

## 2020-02-25 ENCOUNTER — Other Ambulatory Visit: Payer: Self-pay | Admitting: Medical

## 2020-02-25 ENCOUNTER — Telehealth: Payer: Self-pay | Admitting: Emergency Medicine

## 2020-02-25 ENCOUNTER — Other Ambulatory Visit: Payer: Self-pay | Admitting: Emergency Medicine

## 2020-02-25 DIAGNOSIS — C50412 Malignant neoplasm of upper-outer quadrant of left female breast: Secondary | ICD-10-CM

## 2020-02-25 DIAGNOSIS — D649 Anemia, unspecified: Secondary | ICD-10-CM

## 2020-02-25 MED ORDER — MAGIC MOUTHWASH
5.0000 mL | Freq: Four times a day (QID) | ORAL | 2 refills | Status: DC | PRN
Start: 2020-02-25 — End: 2020-03-03

## 2020-02-25 NOTE — Telephone Encounter (Addendum)
Returning pt's VM reporting mouth pain and sore throat, reports difficulty swallowing d/t pain.  Pt denies seeing any sores or white film in her mouth or bad taste in her mouth.  Pt requesting pain medication/other intervention for her mouth pain.  Spoke with PA Lucianne Lei who faxed in prescription for magic mouth wash to pt's preferred pharmacy.  Called pharmacy to confirm receipt of prescription.  Called pt to confirm prescription was sent in and to inform her to f/u as needed if she has no relief with MMW solution w/in next 1-2 days or with any other questions/concerns.

## 2020-02-27 ENCOUNTER — Encounter: Payer: Self-pay | Admitting: Adult Health

## 2020-02-27 ENCOUNTER — Inpatient Hospital Stay: Payer: Medicaid Other

## 2020-02-27 ENCOUNTER — Other Ambulatory Visit: Payer: Self-pay

## 2020-02-27 ENCOUNTER — Ambulatory Visit: Payer: Medicaid Other | Admitting: Internal Medicine

## 2020-02-27 ENCOUNTER — Encounter (HOSPITAL_COMMUNITY): Payer: Self-pay

## 2020-02-27 ENCOUNTER — Inpatient Hospital Stay (HOSPITAL_BASED_OUTPATIENT_CLINIC_OR_DEPARTMENT_OTHER): Payer: Medicaid Other | Admitting: Adult Health

## 2020-02-27 VITALS — BP 116/79 | HR 94 | Temp 98.5°F | Resp 21 | Ht 61.0 in | Wt 320.2 lb

## 2020-02-27 DIAGNOSIS — Z170 Estrogen receptor positive status [ER+]: Secondary | ICD-10-CM | POA: Diagnosis not present

## 2020-02-27 DIAGNOSIS — I10 Essential (primary) hypertension: Secondary | ICD-10-CM | POA: Diagnosis not present

## 2020-02-27 DIAGNOSIS — Z7289 Other problems related to lifestyle: Secondary | ICD-10-CM | POA: Diagnosis not present

## 2020-02-27 DIAGNOSIS — C50412 Malignant neoplasm of upper-outer quadrant of left female breast: Secondary | ICD-10-CM | POA: Diagnosis not present

## 2020-02-27 DIAGNOSIS — I313 Pericardial effusion (noninflammatory): Secondary | ICD-10-CM | POA: Diagnosis not present

## 2020-02-27 DIAGNOSIS — Z95828 Presence of other vascular implants and grafts: Secondary | ICD-10-CM

## 2020-02-27 DIAGNOSIS — N179 Acute kidney failure, unspecified: Secondary | ICD-10-CM | POA: Diagnosis not present

## 2020-02-27 DIAGNOSIS — Z5111 Encounter for antineoplastic chemotherapy: Secondary | ICD-10-CM | POA: Diagnosis not present

## 2020-02-27 DIAGNOSIS — R339 Retention of urine, unspecified: Secondary | ICD-10-CM | POA: Diagnosis not present

## 2020-02-27 DIAGNOSIS — M1990 Unspecified osteoarthritis, unspecified site: Secondary | ICD-10-CM | POA: Diagnosis not present

## 2020-02-27 DIAGNOSIS — C773 Secondary and unspecified malignant neoplasm of axilla and upper limb lymph nodes: Secondary | ICD-10-CM | POA: Diagnosis not present

## 2020-02-27 DIAGNOSIS — I517 Cardiomegaly: Secondary | ICD-10-CM | POA: Diagnosis not present

## 2020-02-27 DIAGNOSIS — N3289 Other specified disorders of bladder: Secondary | ICD-10-CM | POA: Diagnosis not present

## 2020-02-27 LAB — CBC WITH DIFFERENTIAL/PLATELET
Abs Immature Granulocytes: 0.07 10*3/uL (ref 0.00–0.07)
Basophils Absolute: 0 10*3/uL (ref 0.0–0.1)
Basophils Relative: 1 %
Eosinophils Absolute: 0 10*3/uL (ref 0.0–0.5)
Eosinophils Relative: 1 %
HCT: 25.7 % — ABNORMAL LOW (ref 36.0–46.0)
Hemoglobin: 8.5 g/dL — ABNORMAL LOW (ref 12.0–15.0)
Immature Granulocytes: 6 %
Lymphocytes Relative: 25 %
Lymphs Abs: 0.3 10*3/uL — ABNORMAL LOW (ref 0.7–4.0)
MCH: 30.7 pg (ref 26.0–34.0)
MCHC: 33.1 g/dL (ref 30.0–36.0)
MCV: 92.8 fL (ref 80.0–100.0)
Monocytes Absolute: 0.1 10*3/uL (ref 0.1–1.0)
Monocytes Relative: 9 %
Neutro Abs: 0.8 10*3/uL — ABNORMAL LOW (ref 1.7–7.7)
Neutrophils Relative %: 58 %
Platelets: 71 10*3/uL — ABNORMAL LOW (ref 150–400)
RBC: 2.77 MIL/uL — ABNORMAL LOW (ref 3.87–5.11)
RDW: 15.1 % (ref 11.5–15.5)
WBC: 1.3 10*3/uL — ABNORMAL LOW (ref 4.0–10.5)
nRBC: 0 % (ref 0.0–0.2)

## 2020-02-27 LAB — COMPREHENSIVE METABOLIC PANEL
ALT: 13 U/L (ref 0–44)
AST: 8 U/L — ABNORMAL LOW (ref 15–41)
Albumin: 2.5 g/dL — ABNORMAL LOW (ref 3.5–5.0)
Alkaline Phosphatase: 69 U/L (ref 38–126)
Anion gap: 12 (ref 5–15)
BUN: 28 mg/dL — ABNORMAL HIGH (ref 8–23)
CO2: 19 mmol/L — ABNORMAL LOW (ref 22–32)
Calcium: 8.6 mg/dL — ABNORMAL LOW (ref 8.9–10.3)
Chloride: 106 mmol/L (ref 98–111)
Creatinine, Ser: 1.37 mg/dL — ABNORMAL HIGH (ref 0.44–1.00)
GFR calc Af Amer: 47 mL/min — ABNORMAL LOW (ref >60)
GFR calc non Af Amer: 41 mL/min — ABNORMAL LOW (ref >60)
Glucose, Bld: 102 mg/dL — ABNORMAL HIGH (ref 70–99)
Potassium: 4.6 mmol/L (ref 3.5–5.1)
Sodium: 137 mmol/L (ref 135–145)
Total Bilirubin: 0.9 mg/dL (ref 0.3–1.2)
Total Protein: 5.9 g/dL — ABNORMAL LOW (ref 6.5–8.1)

## 2020-02-27 MED ORDER — HEPARIN SOD (PORK) LOCK FLUSH 100 UNIT/ML IV SOLN
500.0000 [IU] | Freq: Once | INTRAVENOUS | Status: AC
  Administered 2020-02-27: 500 [IU]
  Filled 2020-02-27: qty 5

## 2020-02-27 MED ORDER — SODIUM CHLORIDE 0.9% FLUSH
10.0000 mL | Freq: Once | INTRAVENOUS | Status: AC
  Administered 2020-02-27: 10 mL
  Filled 2020-02-27: qty 10

## 2020-02-27 NOTE — Progress Notes (Signed)
[place]  [phone] [phone]     ID: [name] DOB: [birth year]  MR#: [id number]  KNL#:[id number]  Patient Care Team: Sue Blackbird, MD as PCP - General (Family Medicine) Sue Kaufmann, RN as Oncology Nurse Navigator Sue Germany, RN as Oncology Nurse Navigator Sue Keens, MD as Consulting Physician (General Surgery) [name], Sue Dad, MD as Consulting Physician (Oncology) Sue Rudd, MD as Consulting Physician (Radiation Oncology) [name], DPM as Consulting Physician (Podiatry) Sue Dock, NP OTHER MD:  CHIEF COMPLAINT: estrogen receptor positive breast cancer  CURRENT TREATMENT: Adjuvant chemotherapy    INTERVAL HISTORY: [name] returns today for follow up and treatment of her estrogen receptor positive breast cancer.   She started her adjuvant chemotherapy, consisting of cyclophosphamide and doxorubicin in dose dense fashion x4, on 02/05/2020.  Today is cycle 2 day 8 of treatment.  Her first cycle was dose reduced, and her second cycle was slightly increased.  She tolerated this moderately well.  Her most recent echocardiogram on 11/26/2019 showed an ejection fraction of 65-70%.   REVIEW OF SYSTEMS: [name] notes that she is very tired since treatment last week.  She notes that she had several bowel movements last night after eating oatmeal and these were soft, and not watery or loose.  She notes that she doesn't have any fever or chills.  The area where she previously had cellulitis has healed.  She denies any new issues.  She says her appetite and fluid intake has been good.  A detailed ROS was otherwise non contributory.    HISTORY OF CURRENT ILLNESS: From the original intake note:  [name] (pronounced "yellow") had routine screening mammography on 10/24/2019 showing a possible abnormality in the left breast. She underwent left breast ultrasonography at [facility] on 11/05/2019 showing:  suspicious 3.8 cm mass in left breast at 3 o'clock; five abnormal lymph nodes.  Accordingly on 11/12/2019 she proceeded to biopsy of the left breast area in question as well as a lymph node. The pathology from this procedure ([id number]) showed: invasive ductal carcinoma with prominent inflammatory response, grade 3. Prognostic indicators significant for: estrogen receptor, 100% positive and progesterone receptor, 50% positive, both with strong staining intensity. Proliferation marker Ki67 at 70%. HER2 negative by immunohistochemistry (0).  The biopsied lymph node confirmed metastatic breast carcinoma.  The patient's subsequent history is as detailed below.   PAST MEDICAL HISTORY: Past Medical History:  Diagnosis Date  . Arthritis   . Cancer ([place]) 11/2019   Left breast  . Diverticulitis   . Family history of ovarian cancer   . Heart murmur    11/26/19 echo: Mild AS. AV mean gradient 12.0 mmHg; however, LVOT gradient 8 mmHg with intracvitary gradient-significant AS is not suspected  . Hypertension     PAST SURGICAL HISTORY: Past Surgical History:  Procedure Laterality Date  . MASTECTOMY WITH AXILLARY LYMPH NODE DISSECTION Left 12/19/2019   Procedure: LEFT MASTECTOMY WITH AXILLARY LYMPH NODE DISSECTION;  Surgeon: Sue Keens, MD;  Location: [place];  Service: General;  Laterality: Left;  Marland Kitchen MULTIPLE EXTRACTIONS WITH ALVEOLOPLASTY Bilateral 12/14/2019   Procedure: MULTIPLE EXTRACTION WITH ALVEOLOPLASTY;  Surgeon: [name], DDS;  Location: [place];  Service: Oral Surgery;  Laterality: Bilateral;  . PORTACATH PLACEMENT N/A 12/19/2019   Procedure: INSERTION PORT-A-CATH WITH ULTRASOUND GUIDANCE;  Surgeon: Sue Keens, MD;  Location: [place];  Service: General;  Laterality: N/A;  . RADIAL HEAD ARTHROPLASTY Right 06/25/2019   Procedure: RIGHT RADIAL HEAD ARTHROPLASTY;  Surgeon: [name], MD;  Location: [place];  Service: Orthopedics;  Laterality: Right;  . TUBAL LIGATION       FAMILY HISTORY: Family History  Problem Relation Age of Onset  . Hypertension Mother   . Dementia Mother   . Ovarian cancer Half-Sister 62  . Diabetes Half-Sister   . Stroke Half-Sister   Her father died at age 28; she is unsure of the cause of death. Her mother is age 24 as of 10/2019.  The patient has two brothers and four sisters. She reports ovarian cancer in her sister at age 38. She denies a family history of breast, prostate, or pancreatic cancer.   GYNECOLOGIC HISTORY:  No LMP recorded. Patient is postmenopausal. Menarche: 64 years old Age at first live birth: 64 years old [place] 2 LMP around age 33 Contraceptive: used previously HRT never used  Hysterectomy? no BSO? no   SOCIAL HISTORY: (updated 10/2019)  [name] retired from working at the [employer]. She is divorced. She lives at home alone but during the pandemic her sister [name] and mother [name] lived in the same home. [name], age 64, lives in [place] and is handicapped. [name], age 50, is a Biomedical scientist here in [place]. [name] has two granddaughters. She attends a local [employer].    ADVANCED DIRECTIVES: not in place; at the 11/21/2019 visit the patient was given the appropriate documents to complete and notarized at her discretion.  She expressed spontaneously that she would not want to be resuscitated in case of a terminal event.    HEALTH MAINTENANCE: Social History   Tobacco Use  . Smoking status: Never Smoker  . Smokeless tobacco: Never Used  Vaping Use  . Vaping Use: Never used  Substance Use Topics  . Alcohol use: Yes    Alcohol/week: 3.0 standard drinks    Types: 1 Cans of beer, 2 Shots of liquor per week    Comment: daily  . Drug use: Yes    Types: Marijuana     Colonoscopy: never done  PAP: "1980?"  Bone density: never done   Allergies  Allergen Reactions  . Amlodipine Swelling    BLE edema    Current Outpatient Medications  Medication Sig Dispense Refill  .  aspirin EC 81 MG EC tablet Take 1 tablet (81 mg total) by mouth daily. Swallow whole. 30 tablet 11  . Blood Pressure Monitor DEVI Use as instructed by provider to check bloos pressure daily. ICD: I10 1 each 0  . carvedilol (COREG) 12.5 MG tablet Take 1 tablet (12.5 mg total) by mouth 2 (two) times daily with a meal. 60 tablet 3  . colchicine 0.6 MG tablet Take 0.5 tablets (0.3 mg total) by mouth every other day. 20 tablet 0  . hydrALAZINE (APRESOLINE) 25 MG tablet Take 1 tablet (25 mg total) by mouth every 8 (eight) hours. 90 tablet 0  . lidocaine-prilocaine (EMLA) cream Apply 1 application topically as needed. 30 g 0  . magic mouthwash SOLN Take 5 mLs by mouth 4 (four) times daily as needed for mouth pain. Swish and swallow or spit 240 mL 2  . magic mouthwash w/lidocaine SOLN Take 5 mLs by mouth 3 (three) times daily as needed for mouth pain.    Marland Kitchen ondansetron (ZOFRAN) 4 MG tablet Take 1 tablet (4 mg total) by mouth every 8 (eight) hours as needed for nausea or vomiting. 20 tablet 0   No current facility-administered medications for this visit.  OBJECTIVE: African-American woman examined in the infusion area  Vitals:   02/27/20 1218  BP: 116/79  Pulse: 94  Resp: (!) 21  Temp: 98.5 F (36.9 C)  SpO2: 100%     Body mass index is 60.5 kg/m.   Wt Readings from Last 3 Encounters:  02/27/20 (!) 320 lb 3.2 oz (145.2 kg)  02/19/20 (!) 315 lb 8 oz (143.1 kg)  02/10/20 (!) 307 lb (139.3 kg)  For vitals for the 02/20/2020 visit please see the treatment area flowsheet   ECOG FS:2 - Symptomatic, <50% confined to bed GENERAL: Patient is a well appearing female in no acute distress HEENT:  Sclerae anicteric.  Oropharynx clear and moist. No ulcerations or evidence of oropharyngeal candidiasis. Neck is supple.  NODES:  No cervical, supraclavicular, or axillary lymphadenopathy palpated.  BREAST EXAM:  Deferred. LUNGS:  Clear to auscultation bilaterally.  No wheezes or rhonchi. HEART:  Regular  rhythm, tachycardic, +systolic murmur ABDOMEN:  Soft, nontender.  Positive, normoactive bowel sounds. No organomegaly palpated. MSK:  No focal spinal tenderness to palpation. EXTREMITIES:  No peripheral edema.   SKIN:  Clear with no obvious rashes or skin changes. No nail dyscrasia. NEURO:  Nonfocal. Well oriented.  Appropriate affect.   LAB RESULTS:  CMP     Component Value Date/Time   NA 137 02/27/2020 1159   NA 135 01/02/2020 1710   K 4.6 02/27/2020 1159   CL 106 02/27/2020 1159   CO2 19 (L) 02/27/2020 1159   GLUCOSE 102 (H) 02/27/2020 1159   BUN 28 (H) 02/27/2020 1159   BUN 25 01/02/2020 1710   CREATININE 1.37 (H) 02/27/2020 1159   CREATININE 1.59 (H) 11/21/2019 0908   CALCIUM 8.6 (L) 02/27/2020 1159   PROT 5.9 (L) 02/27/2020 1159   PROT 8.5 06/15/2019 1622   ALBUMIN 2.5 (L) 02/27/2020 1159   ALBUMIN 4.3 06/15/2019 1622   AST 8 (L) 02/27/2020 1159   AST 61 (H) 11/21/2019 0908   ALT 13 02/27/2020 1159   ALT 32 11/21/2019 0908   ALKPHOS 69 02/27/2020 1159   BILITOT 0.9 02/27/2020 1159   BILITOT 0.5 11/21/2019 0908   GFRNONAA 41 (L) 02/27/2020 1159   GFRNONAA 34 (L) 11/21/2019 0908   GFRAA 47 (L) 02/27/2020 1159   GFRAA 39 (L) 11/21/2019 0908    No results found for: TOTALPROTELP, ALBUMINELP, A1GS, A2GS, BETS, BETA2SER, GAMS, MSPIKE, SPEI  Lab Results  Component Value Date   WBC 1.3 (L) 02/27/2020   NEUTROABS PENDING 02/27/2020   HGB 8.5 (L) 02/27/2020   HCT 25.7 (L) 02/27/2020   MCV 92.8 02/27/2020   PLT 71 (L) 02/27/2020    No results found for: LABCA2  No components found for: JGOTLX726  No results for input(s): INR in the last 168 hours.  No results found for: LABCA2  No results found for: OMB559  No results found for: RCB638  No results found for: GTX646  No results found for: CA2729  No components found for: HGQUANT  No results found for: CEA1 / No results found for: CEA1   No results found for: AFPTUMOR  No results found for:  CHROMOGRNA  No results found for: KPAFRELGTCHN, LAMBDASER, KAPLAMBRATIO (kappa/lambda light chains)  No results found for: HGBA, HGBA2QUANT, HGBFQUANT, HGBSQUAN (Hemoglobinopathy evaluation)   No results found for: LDH  Lab Results  Component Value Date   IRON 111 01/22/2020   TIBC 231 (L) 01/22/2020   IRONPCTSAT 48 (H) 01/22/2020   (Iron and TIBC)  Lab Results  Component Value Date   FERRITIN 3000 (H) 11/10/2007    Urinalysis    Component Value Date/Time   COLORURINE YELLOW 02/10/2020 1427   APPEARANCEUR CLEAR 02/10/2020 1427   LABSPEC 1.012 02/10/2020 1427   PHURINE 5.0 02/10/2020 1427   GLUCOSEU NEGATIVE 02/10/2020 1427   HGBUR SMALL (A) 02/10/2020 1427   BILIRUBINUR NEGATIVE 02/10/2020 1427   KETONESUR NEGATIVE 02/10/2020 1427   PROTEINUR NEGATIVE 02/10/2020 1427   UROBILINOGEN 1.0 07/14/2012 1630   NITRITE NEGATIVE 02/10/2020 1427   LEUKOCYTESUR NEGATIVE 02/10/2020 1427     STUDIES: DG Ankle 2 Views Left  Result Date: 02/11/2020 CLINICAL DATA:  Swelling and warmth EXAM: LEFT ANKLE: 2 VIEW COMPARISON:  November 04, 2018 FINDINGS: Frontal and lateral views were obtained. There is marked soft tissue swelling. No fracture or joint effusion. No appreciable joint space narrowing. There is a posterior calcaneal spur. No erosive change or bony destruction. IMPRESSION: Diffuse soft tissue swelling. No fracture or bony destruction. No erosion. No appreciable joint space narrowing. There is a posterior calcaneal spur. Electronically Signed   By: Lowella Grip III M.D.   On: 02/11/2020 10:22   DG Chest Portable 1 View  Result Date: 02/10/2020 CLINICAL DATA:  Breast carcinoma with recent blood transfusion. Concern for potential sepsis EXAM: PORTABLE CHEST 1 VIEW COMPARISON:  December 19, 2019 FINDINGS: Port-A-Cath tip is in the superior vena cava. No pneumothorax. The lungs are clear. The heart is mildly enlarged with pulmonary vascularity normal. No adenopathy. There are  surgical clips in the left axillary region. IMPRESSION: Port-A-Cath as described without pneumothorax. Lungs clear. Mild cardiomegaly. No evident adenopathy. Electronically Signed   By: Lowella Grip III M.D.   On: 02/10/2020 09:53   DG Foot 2 Views Left  Result Date: 02/11/2020 CLINICAL DATA:  Soft tissue swelling and warmth EXAM: LEFT FOOT - 2 VIEW COMPARISON:  November 04, 2018 FINDINGS: Frontal and lateral views were obtained. There is marked soft tissue swelling. No evident fracture or dislocation. There is degenerative change with narrowing of the first MTP joint. Other joint spaces appear unremarkable. There is a posterior calcaneal spur. No erosive change or bony destruction. Bones appear somewhat osteoporotic. IMPRESSION: Marked soft tissue swelling. No bony destruction or erosion. Narrowing first MTP joint. Other joint spaces appear unremarkable. There is a posterior calcaneal spur. Electronically Signed   By: Lowella Grip III M.D.   On: 02/11/2020 10:18   ECHOCARDIOGRAM COMPLETE  Result Date: 02/11/2020    ECHOCARDIOGRAM REPORT   Patient Name:   SATORI KRABILL Date of Exam: 02/11/2020 Medical Rec #:  [id number]     Height:       61.0 in Accession #:    [id number]    Weight:       307.0 lb Date of Birth:  1956/03/15    BSA:          2.266 m Patient Age:    29 years      BP:           164/71 mmHg Patient Gender: F             HR:           123 bpm. Exam Location:  Inpatient Procedure: 2D Echo, Cardiac Doppler, Color Doppler and Intracardiac            Opacification Agent Indications:    Hypertensive emergency  History:        Patient has prior history of Echocardiogram examinations, most  recent 01/22/2020. Risk Factors:Hypertension and Non-Smoker.                 Breast cancer.  Sonographer:    Vickie Epley RDCS Referring Phys: [id number] [place]  1. Left ventricular ejection fraction, by estimation, is 70 to 75%. The left ventricle has hyperdynamic function. The  left ventricle has no regional wall motion abnormalities. Left ventricular diastolic parameters are consistent with Grade I diastolic dysfunction (impaired relaxation). Elevated left ventricular end-diastolic pressure.  2. Right ventricular systolic function is normal. The right ventricular size is normal. Tricuspid regurgitation signal is inadequate for assessing PA pressure.  3. The mitral valve is normal in structure. No evidence of mitral valve regurgitation. No evidence of mitral stenosis.  4. The aortic valve is normal in structure. Aortic valve regurgitation is not visualized. No aortic stenosis is present.  5. The inferior vena cava is normal in size with greater than 50% respiratory variability, suggesting right atrial pressure of 3 mmHg. FINDINGS  Left Ventricle: Left ventricular ejection fraction, by estimation, is 70 to 75%. The left ventricle has hyperdynamic function. The left ventricle has no regional wall motion abnormalities. Definity contrast agent was given IV to delineate the left ventricular endocardial borders. The left ventricular internal cavity size was normal in size. There is no left ventricular hypertrophy. Left ventricular diastolic parameters are consistent with Grade I diastolic dysfunction (impaired relaxation). Elevated left ventricular end-diastolic pressure. Right Ventricle: The right ventricular size is normal. No increase in right ventricular wall thickness. Right ventricular systolic function is normal. Tricuspid regurgitation signal is inadequate for assessing PA pressure. Left Atrium: Left atrial size was normal in size. Right Atrium: Right atrial size was normal in size. Pericardium: There is no evidence of pericardial effusion. Mitral Valve: The mitral valve is normal in structure. Normal mobility of the mitral valve leaflets. No evidence of mitral valve regurgitation. No evidence of mitral valve stenosis. Tricuspid Valve: The tricuspid valve is normal in structure.  Tricuspid valve regurgitation is trivial. No evidence of tricuspid stenosis. Aortic Valve: The aortic valve is normal in structure. Aortic valve regurgitation is not visualized. No aortic stenosis is present. Pulmonic Valve: The pulmonic valve was normal in structure. Pulmonic valve regurgitation is not visualized. No evidence of pulmonic stenosis. Aorta: The aortic root is normal in size and structure. Venous: The inferior vena cava is normal in size with greater than 50% respiratory variability, suggesting right atrial pressure of 3 mmHg. IAS/Shunts: No atrial level shunt detected by color flow Doppler.  LEFT VENTRICLE PLAX 2D LVIDd:         3.80 cm  Diastology LVIDs:         2.30 cm  LV e' lateral:   8.49 cm/s LV PW:         0.80 cm  LV E/e' lateral: 13.9 LV IVS:        0.80 cm  LV e' medial:    7.18 cm/s LVOT diam:     1.70 cm  LV E/e' medial:  16.4 LV SV:         51 LV SV Index:   23 LVOT Area:     2.27 cm  RIGHT VENTRICLE RV S prime:     16.40 cm/s TAPSE (M-mode): 1.9 cm LEFT ATRIUM             Index       RIGHT ATRIUM           Index LA diam:  3.30 cm 1.46 cm/m  RA Area:     12.60 cm LA Vol (A2C):   39.3 ml 17.34 ml/m RA Volume:   27.30 ml  12.05 ml/m LA Vol (A4C):   38.0 ml 16.77 ml/m LA Biplane Vol: 41.6 ml 18.36 ml/m  AORTIC VALVE LVOT Vmax:   170.00 cm/s LVOT Vmean:  121.000 cm/s LVOT VTI:    0.225 m  AORTA Ao Root diam: 2.60 cm MITRAL VALVE MV Area (PHT): 5.75 cm     SHUNTS MV Decel Time: 132 msec     Systemic VTI:  0.22 m MV E velocity: 118.00 cm/s  Systemic Diam: 1.70 cm MV A velocity: 159.00 cm/s MV E/A ratio:  0.74 Fransico Him MD Electronically signed by Fransico Him MD Signature Date/Time: 02/11/2020/9:20:01 AM    Final    VAS [place] LOWER EXTREMITY VENOUS (DVT) (ONLY [facility] & [facility])  Result Date: 02/10/2020  Lower Venous DVTStudy Indications: Swelling.  Limitations: Body habitus. Comparison Study: 11/04/18 negative Performing Technologist: June Leap RDMS, RVT  Examination Guidelines: A  complete evaluation includes B-mode imaging, spectral Doppler, color Doppler, and power Doppler as needed of all accessible portions of each vessel. Bilateral testing is considered an integral part of a complete examination. Limited examinations for reoccurring indications may be performed as noted. The reflux portion of the exam is performed with the patient in reverse Trendelenburg.  +---------+---------------+---------+-----------+----------+--------------+ LEFT     CompressibilityPhasicitySpontaneityPropertiesThrombus Aging +---------+---------------+---------+-----------+----------+--------------+ CFV      Full           Yes      Yes                                 +---------+---------------+---------+-----------+----------+--------------+ SFJ      Full                                                        +---------+---------------+---------+-----------+----------+--------------+ FV Prox  Full                                                        +---------+---------------+---------+-----------+----------+--------------+ FV Mid   Full                                                        +---------+---------------+---------+-----------+----------+--------------+ FV DistalFull                                                        +---------+---------------+---------+-----------+----------+--------------+ PFV                                                   Not visualized +---------+---------------+---------+-----------+----------+--------------+ POP  Full           Yes      Yes                                 +---------+---------------+---------+-----------+----------+--------------+ PTV      Full                                                        +---------+---------------+---------+-----------+----------+--------------+ PERO                                                  Not visualized  +---------+---------------+---------+-----------+----------+--------------+     Summary: LEFT: - There is no evidence of deep vein thrombosis in the lower extremity. However, portions of this examination were limited- see technologist comments above.  *See table(s) above for measurements and observations. Electronically signed by Monica Martinez MD on 02/10/2020 at 11:15:19 AM.    Final      ELIGIBLE FOR AVAILABLE RESEARCH PROTOCOL: AET  ASSESSMENT: 64 y.o. [place] woman status post left breast upper outer quadrant and left axillary lymph node biopsies 11/12/2019 for a clinical T2 N2, stage IIIA invasive ductal carcinoma, grade 3, estrogen and progesterone receptor positive, HER-2 not amplified, with an MIB-1 of 70%  (a) the biopsied axillary lymph node was positive  (1) genetics testing 11/29/2019 through the Breast Cancer STAT Panel offered by [facility] found no deleterious mutations in ATM, BRCA1, BRCA2, CDH1, CHEK2, PALB2, PTEN, STK11 and TP53.  Additional testing through the Common Hereditary Cancers Panel confirmed no deleterious mutations in APC, ATM, AXIN2, BARD1, BMPR1A, BRCA1, BRCA2, BRIP1, CDH1, CDK4, CDKN2A (p14ARF), CDKN2A (p16INK4a), CHEK2, CTNNA1, DICER1, EPCAM (Deletion/duplication testing only), GREM1 (promoter region deletion/duplication testing only), KIT, MEN1, MLH1, MSH2, MSH3, MSH6, MUTYH, NBN, NF1, NHTL1, PALB2, PDGFRA, PMS2, POLD1, POLE, PTEN, RAD50, RAD51C, RAD51D, RNF43, SDHB, SDHC, SDHD, SMAD4, SMARCA4. STK11, TP53, TSC1, TSC2, and VHL.  The following genes were evaluated for sequence changes only: SDHA and HOXB13 c.251G>A variant only.  (2) status post left modified radical mastectomy 12/19/2019 for a pT2 pN1, stage IIB invasive ductal carcinoma, grade 3, with negative margins.  (a) 1 of 13 axillary lymph nodes removed was positive  (3) adjuvant chemotherapy will consist of cyclophosphamide and doxorubicin in dose dense fashion x4 starting 01/22/2020 followed by weekly  paclitaxel x12  (a) baseline echocardiogram 11/26/2019 shows an ejection fraction in the 65-70% range.  (b) first cycle of cyclophosphamide and doxorubicin dose reduced to assess tolerance  (4) adjuvant radiation as appropriate  (5) antiestrogens to follow   PLAN: Maloni is feeling moderately well since completing treatment.  She notes being very tired and wanting to go home and rest.  She was  Slightly tachycardic and had a faint murmur on exam.  I got an EKG and reviewed it with Dr. Lindi Adie, along with her vitals and weight increase.  She has sinus tachycardia at 124, with ? Age indeterminate septal infarct.  She has had no cardiac symptoms. I will get an echocardiogram, and she knows to call us if she develops any concerns such as fever, chills, chest pain, palpitations, cough, shortness of breath.  Red flags reviewed.  I reviewed her labs and she is slightly neutropenic.  I initially thought she may need antibiotics, however her ANC is 0.8, so she will not need them after this cycle of therapy.    We will see Breindel back next week for labs, f/u, and her next treatment.  She knows to call for any questions that may arise between now and her next appointment.  We are happy to see her sooner if needed.  Total encounter time 30 minutes.Wilber Bihari, NP 02/28/20 7:40 AM Medical Oncology and Hematology [facility] [place], [place] [address] Tel. [phone]    Fax. [phone]   *Total Encounter Time as defined by the Centers for Medicare and Medicaid Services includes, in addition to the face-to-face time of a patient visit (documented in the note above) non-face-to-face time: obtaining and reviewing outside history, ordering and reviewing medications, tests or procedures, care coordination (communications with other health care professionals or caregivers) and documentation in the medical record.

## 2020-02-27 NOTE — Progress Notes (Signed)
Reminded patient to take off band aid when she gets home. Patient verbalized understanding.

## 2020-02-28 ENCOUNTER — Encounter: Payer: Self-pay | Admitting: *Deleted

## 2020-02-28 ENCOUNTER — Telehealth: Payer: Self-pay | Admitting: Adult Health

## 2020-02-28 NOTE — Telephone Encounter (Signed)
No 6/30 los. No changes made to pt's schedule.

## 2020-02-29 MED FILL — Dexamethasone Sodium Phosphate Inj 100 MG/10ML: INTRAMUSCULAR | Qty: 1 | Status: AC

## 2020-02-29 MED FILL — Fosaprepitant Dimeglumine For IV Infusion 150 MG (Base Eq): INTRAVENOUS | Qty: 5 | Status: AC

## 2020-03-03 ENCOUNTER — Other Ambulatory Visit: Payer: Self-pay

## 2020-03-03 ENCOUNTER — Inpatient Hospital Stay (HOSPITAL_COMMUNITY)
Admission: EM | Admit: 2020-03-03 | Discharge: 2020-03-21 | DRG: 602 | Disposition: A | Discharge status: Home Health | Payer: Medicaid Other | Attending: Internal Medicine | Admitting: Internal Medicine

## 2020-03-03 ENCOUNTER — Ambulatory Visit (HOSPITAL_COMMUNITY)
Admission: RE | Admit: 2020-03-03 | Discharge: 2020-03-03 | Disposition: A | Discharge status: Home / Self Care | Payer: Medicaid Other | Source: Ambulatory Visit | Attending: Adult Health | Admitting: Adult Health

## 2020-03-03 ENCOUNTER — Encounter (HOSPITAL_COMMUNITY): Payer: Self-pay

## 2020-03-03 DIAGNOSIS — Z9012 Acquired absence of left breast and nipple: Secondary | ICD-10-CM

## 2020-03-03 DIAGNOSIS — M1A09X0 Idiopathic chronic gout, multiple sites, without tophus (tophi): Secondary | ICD-10-CM

## 2020-03-03 DIAGNOSIS — C773 Secondary and unspecified malignant neoplasm of axilla and upper limb lymph nodes: Secondary | ICD-10-CM | POA: Diagnosis not present

## 2020-03-03 DIAGNOSIS — L03311 Cellulitis of abdominal wall: Principal | ICD-10-CM | POA: Diagnosis present

## 2020-03-03 DIAGNOSIS — Z20822 Contact with and (suspected) exposure to covid-19: Secondary | ICD-10-CM | POA: Diagnosis not present

## 2020-03-03 DIAGNOSIS — D631 Anemia in chronic kidney disease: Secondary | ICD-10-CM | POA: Diagnosis present

## 2020-03-03 DIAGNOSIS — R238 Other skin changes: Secondary | ICD-10-CM | POA: Diagnosis not present

## 2020-03-03 DIAGNOSIS — I890 Lymphedema, not elsewhere classified: Secondary | ICD-10-CM | POA: Diagnosis present

## 2020-03-03 DIAGNOSIS — Z8619 Personal history of other infectious and parasitic diseases: Secondary | ICD-10-CM

## 2020-03-03 DIAGNOSIS — R5381 Other malaise: Secondary | ICD-10-CM | POA: Diagnosis not present

## 2020-03-03 DIAGNOSIS — T451X5A Adverse effect of antineoplastic and immunosuppressive drugs, initial encounter: Secondary | ICD-10-CM | POA: Diagnosis present

## 2020-03-03 DIAGNOSIS — L89323 Pressure ulcer of left buttock, stage 3: Secondary | ICD-10-CM | POA: Diagnosis not present

## 2020-03-03 DIAGNOSIS — D696 Thrombocytopenia, unspecified: Secondary | ICD-10-CM

## 2020-03-03 DIAGNOSIS — E872 Acidosis: Secondary | ICD-10-CM | POA: Diagnosis present

## 2020-03-03 DIAGNOSIS — D72829 Elevated white blood cell count, unspecified: Secondary | ICD-10-CM | POA: Diagnosis not present

## 2020-03-03 DIAGNOSIS — R609 Edema, unspecified: Secondary | ICD-10-CM

## 2020-03-03 DIAGNOSIS — R1013 Epigastric pain: Secondary | ICD-10-CM | POA: Diagnosis not present

## 2020-03-03 DIAGNOSIS — D6959 Other secondary thrombocytopenia: Secondary | ICD-10-CM | POA: Diagnosis present

## 2020-03-03 DIAGNOSIS — N179 Acute kidney failure, unspecified: Secondary | ICD-10-CM | POA: Diagnosis present

## 2020-03-03 DIAGNOSIS — C50412 Malignant neoplasm of upper-outer quadrant of left female breast: Secondary | ICD-10-CM | POA: Diagnosis not present

## 2020-03-03 DIAGNOSIS — L98491 Non-pressure chronic ulcer of skin of other sites limited to breakdown of skin: Secondary | ICD-10-CM | POA: Diagnosis present

## 2020-03-03 DIAGNOSIS — I129 Hypertensive chronic kidney disease with stage 1 through stage 4 chronic kidney disease, or unspecified chronic kidney disease: Secondary | ICD-10-CM | POA: Diagnosis not present

## 2020-03-03 DIAGNOSIS — L03315 Cellulitis of perineum: Secondary | ICD-10-CM | POA: Diagnosis not present

## 2020-03-03 DIAGNOSIS — D6481 Anemia due to antineoplastic chemotherapy: Secondary | ICD-10-CM | POA: Diagnosis present

## 2020-03-03 DIAGNOSIS — I10 Essential (primary) hypertension: Secondary | ICD-10-CM | POA: Diagnosis not present

## 2020-03-03 DIAGNOSIS — Z833 Family history of diabetes mellitus: Secondary | ICD-10-CM

## 2020-03-03 DIAGNOSIS — L03116 Cellulitis of left lower limb: Secondary | ICD-10-CM | POA: Diagnosis not present

## 2020-03-03 DIAGNOSIS — D649 Anemia, unspecified: Secondary | ICD-10-CM | POA: Diagnosis not present

## 2020-03-03 DIAGNOSIS — L03115 Cellulitis of right lower limb: Secondary | ICD-10-CM | POA: Diagnosis not present

## 2020-03-03 DIAGNOSIS — I872 Venous insufficiency (chronic) (peripheral): Secondary | ICD-10-CM | POA: Diagnosis present

## 2020-03-03 DIAGNOSIS — Z170 Estrogen receptor positive status [ER+]: Secondary | ICD-10-CM | POA: Diagnosis not present

## 2020-03-03 DIAGNOSIS — Z6841 Body Mass Index (BMI) 40.0 and over, adult: Secondary | ICD-10-CM

## 2020-03-03 DIAGNOSIS — N1832 Chronic kidney disease, stage 3b: Secondary | ICD-10-CM | POA: Diagnosis present

## 2020-03-03 DIAGNOSIS — M1990 Unspecified osteoarthritis, unspecified site: Secondary | ICD-10-CM | POA: Diagnosis present

## 2020-03-03 DIAGNOSIS — Z7982 Long term (current) use of aspirin: Secondary | ICD-10-CM

## 2020-03-03 DIAGNOSIS — K5900 Constipation, unspecified: Secondary | ICD-10-CM | POA: Diagnosis not present

## 2020-03-03 DIAGNOSIS — R531 Weakness: Secondary | ICD-10-CM | POA: Diagnosis present

## 2020-03-03 DIAGNOSIS — E538 Deficiency of other specified B group vitamins: Secondary | ICD-10-CM | POA: Diagnosis present

## 2020-03-03 DIAGNOSIS — E1122 Type 2 diabetes mellitus with diabetic chronic kidney disease: Secondary | ICD-10-CM | POA: Diagnosis present

## 2020-03-03 DIAGNOSIS — D84821 Immunodeficiency due to drugs: Secondary | ICD-10-CM | POA: Diagnosis not present

## 2020-03-03 DIAGNOSIS — Z8719 Personal history of other diseases of the digestive system: Secondary | ICD-10-CM

## 2020-03-03 DIAGNOSIS — Z79899 Other long term (current) drug therapy: Secondary | ICD-10-CM

## 2020-03-03 DIAGNOSIS — L98499 Non-pressure chronic ulcer of skin of other sites with unspecified severity: Secondary | ICD-10-CM | POA: Diagnosis present

## 2020-03-03 DIAGNOSIS — Z823 Family history of stroke: Secondary | ICD-10-CM

## 2020-03-03 DIAGNOSIS — M109 Gout, unspecified: Secondary | ICD-10-CM | POA: Diagnosis not present

## 2020-03-03 DIAGNOSIS — B370 Candidal stomatitis: Secondary | ICD-10-CM | POA: Diagnosis not present

## 2020-03-03 DIAGNOSIS — Z888 Allergy status to other drugs, medicaments and biological substances status: Secondary | ICD-10-CM

## 2020-03-03 DIAGNOSIS — R32 Unspecified urinary incontinence: Secondary | ICD-10-CM | POA: Diagnosis present

## 2020-03-03 DIAGNOSIS — Z03818 Encounter for observation for suspected exposure to other biological agents ruled out: Secondary | ICD-10-CM | POA: Diagnosis not present

## 2020-03-03 DIAGNOSIS — Z8041 Family history of malignant neoplasm of ovary: Secondary | ICD-10-CM

## 2020-03-03 DIAGNOSIS — N1831 Chronic kidney disease, stage 3a: Secondary | ICD-10-CM | POA: Diagnosis present

## 2020-03-03 DIAGNOSIS — Z8249 Family history of ischemic heart disease and other diseases of the circulatory system: Secondary | ICD-10-CM

## 2020-03-03 LAB — URINALYSIS, ROUTINE W REFLEX MICROSCOPIC
Bilirubin Urine: NEGATIVE
Glucose, UA: NEGATIVE mg/dL
Ketones, ur: NEGATIVE mg/dL
Leukocytes,Ua: NEGATIVE
Nitrite: NEGATIVE
Protein, ur: NEGATIVE mg/dL
Specific Gravity, Urine: 1.008 (ref 1.005–1.030)
pH: 5 (ref 5.0–8.0)

## 2020-03-03 LAB — CBC WITH DIFFERENTIAL/PLATELET
Abs Immature Granulocytes: 1.69 10*3/uL — ABNORMAL HIGH (ref 0.00–0.07)
Basophils Absolute: 0.1 10*3/uL (ref 0.0–0.1)
Basophils Relative: 1 %
Eosinophils Absolute: 0 10*3/uL (ref 0.0–0.5)
Eosinophils Relative: 0 %
HCT: 23.9 % — ABNORMAL LOW (ref 36.0–46.0)
Hemoglobin: 7.9 g/dL — ABNORMAL LOW (ref 12.0–15.0)
Immature Granulocytes: 10 %
Lymphocytes Relative: 5 %
Lymphs Abs: 0.8 10*3/uL (ref 0.7–4.0)
MCH: 30.7 pg (ref 26.0–34.0)
MCHC: 33.1 g/dL (ref 30.0–36.0)
MCV: 93 fL (ref 80.0–100.0)
Monocytes Absolute: 1 10*3/uL (ref 0.1–1.0)
Monocytes Relative: 6 %
Neutro Abs: 13.7 10*3/uL — ABNORMAL HIGH (ref 1.7–7.7)
Neutrophils Relative %: 78 %
Platelets: 127 10*3/uL — ABNORMAL LOW (ref 150–400)
RBC: 2.57 MIL/uL — ABNORMAL LOW (ref 3.87–5.11)
RDW: 15.5 % (ref 11.5–15.5)
WBC: 17.2 10*3/uL — ABNORMAL HIGH (ref 4.0–10.5)
nRBC: 0 % (ref 0.0–0.2)

## 2020-03-03 LAB — COMPREHENSIVE METABOLIC PANEL
ALT: 15 U/L (ref 0–44)
AST: 15 U/L (ref 15–41)
Albumin: 2.5 g/dL — ABNORMAL LOW (ref 3.5–5.0)
Alkaline Phosphatase: 52 U/L (ref 38–126)
Anion gap: 13 (ref 5–15)
BUN: 32 mg/dL — ABNORMAL HIGH (ref 8–23)
CO2: 21 mmol/L — ABNORMAL LOW (ref 22–32)
Calcium: 8.2 mg/dL — ABNORMAL LOW (ref 8.9–10.3)
Chloride: 101 mmol/L (ref 98–111)
Creatinine, Ser: 1.61 mg/dL — ABNORMAL HIGH (ref 0.44–1.00)
GFR calc Af Amer: 39 mL/min — ABNORMAL LOW (ref >60)
GFR calc non Af Amer: 33 mL/min — ABNORMAL LOW (ref >60)
Glucose, Bld: 142 mg/dL — ABNORMAL HIGH (ref 70–99)
Potassium: 3.9 mmol/L (ref 3.5–5.1)
Sodium: 135 mmol/L (ref 135–145)
Total Bilirubin: 0.3 mg/dL (ref 0.3–1.2)
Total Protein: 5.8 g/dL — ABNORMAL LOW (ref 6.5–8.1)

## 2020-03-03 LAB — HEMOGLOBIN A1C
Hgb A1c MFr Bld: 6.1 % — ABNORMAL HIGH (ref 4.8–5.6)
Mean Plasma Glucose: 128.37 mg/dL

## 2020-03-03 LAB — LACTIC ACID, PLASMA
Lactic Acid, Venous: 2.7 mmol/L (ref 0.5–1.9)
Lactic Acid, Venous: 2.1 mmol/L (ref 0.5–1.9)

## 2020-03-03 LAB — SARS CORONAVIRUS 2 BY RT PCR (HOSPITAL ORDER, PERFORMED IN ~~LOC~~ HOSPITAL LAB): SARS Coronavirus 2: NEGATIVE

## 2020-03-03 MED ORDER — METOPROLOL TARTRATE 5 MG/5ML IV SOLN: 5.0000 mg | Freq: Four times a day (QID) | INTRAVENOUS | Status: DC | PRN

## 2020-03-03 MED ORDER — VALACYCLOVIR HCL 500 MG PO TABS
1000.0000 mg | ORAL_TABLET | Freq: Two times a day (BID) | ORAL | Status: DC
  Administered 2020-03-03 – 2020-03-17 (×29): 1000 mg via ORAL
  Filled 2020-03-03 (×30): qty 2

## 2020-03-03 MED ORDER — OXYCODONE-ACETAMINOPHEN 5-325 MG PO TABS
1.0000 | ORAL_TABLET | Freq: Once | ORAL | Status: AC
  Administered 2020-03-03: 1 via ORAL
  Filled 2020-03-03: qty 1

## 2020-03-03 MED ORDER — CEFAZOLIN SODIUM-DEXTROSE 1-4 GM/50ML-% IV SOLN
1.0000 g | Freq: Three times a day (TID) | INTRAVENOUS | Status: DC
  Administered 2020-03-03 – 2020-03-11 (×24): 1 g via INTRAVENOUS
  Filled 2020-03-03 (×27): qty 50

## 2020-03-03 MED ORDER — HYDROMORPHONE HCL 1 MG/ML IJ SOLN
0.5000 mg | INTRAMUSCULAR | Status: DC | PRN
  Administered 2020-03-04 – 2020-03-19 (×21): 1 mg via INTRAVENOUS
  Filled 2020-03-03 (×21): qty 1

## 2020-03-03 MED ORDER — ACETAMINOPHEN 650 MG RE SUPP: 650.0000 mg | Freq: Four times a day (QID) | RECTAL | Status: DC | PRN

## 2020-03-03 MED ORDER — CARVEDILOL 12.5 MG PO TABS
12.5000 mg | ORAL_TABLET | Freq: Two times a day (BID) | ORAL | Status: DC
  Administered 2020-03-03 – 2020-03-21 (×34): 12.5 mg via ORAL
  Filled 2020-03-03 (×35): qty 1

## 2020-03-03 MED ORDER — MAGIC MOUTHWASH
5.0000 mL | Freq: Four times a day (QID) | ORAL | Status: DC | PRN
  Filled 2020-03-03: qty 5

## 2020-03-03 MED ORDER — POLYETHYLENE GLYCOL 3350 17 G PO PACK: 17.0000 g | PACK | Freq: Every day | ORAL | Status: DC | PRN

## 2020-03-03 MED ORDER — ACETAMINOPHEN 325 MG PO TABS
650.0000 mg | ORAL_TABLET | Freq: Four times a day (QID) | ORAL | Status: DC | PRN
  Administered 2020-03-07 (×2): 650 mg via ORAL
  Filled 2020-03-03 (×2): qty 2

## 2020-03-03 MED ORDER — COLCHICINE 0.3 MG HALF TABLET
0.3000 mg | ORAL_TABLET | ORAL | Status: DC
  Administered 2020-03-04 – 2020-03-20 (×9): 0.3 mg via ORAL
  Filled 2020-03-03 (×7): qty 1
  Filled 2020-03-03: qty 0.5
  Filled 2020-03-03 (×4): qty 1

## 2020-03-03 MED ORDER — ONDANSETRON HCL 4 MG PO TABS
4.0000 mg | ORAL_TABLET | Freq: Three times a day (TID) | ORAL | Status: DC | PRN
  Administered 2020-03-06: 4 mg via ORAL
  Filled 2020-03-03: qty 1

## 2020-03-03 MED ORDER — HYDRALAZINE HCL 25 MG PO TABS
25.0000 mg | ORAL_TABLET | Freq: Three times a day (TID) | ORAL | Status: DC
  Administered 2020-03-03 – 2020-03-21 (×44): 25 mg via ORAL
  Filled 2020-03-03 (×53): qty 1

## 2020-03-03 MED ORDER — LACTATED RINGERS IV BOLUS
1000.0000 mL | Freq: Once | INTRAVENOUS | Status: AC
  Administered 2020-03-03: 1000 mL via INTRAVENOUS

## 2020-03-03 MED ORDER — ENOXAPARIN SODIUM 40 MG/0.4ML ~~LOC~~ SOLN
40.0000 mg | SUBCUTANEOUS | Status: DC
  Administered 2020-03-03: 40 mg via SUBCUTANEOUS
  Filled 2020-03-03: qty 0.4

## 2020-03-03 MED ORDER — ASPIRIN EC 81 MG PO TBEC
81.0000 mg | DELAYED_RELEASE_TABLET | Freq: Every day | ORAL | Status: DC
  Administered 2020-03-03 – 2020-03-21 (×19): 81 mg via ORAL
  Filled 2020-03-03 (×19): qty 1

## 2020-03-03 NOTE — ED Provider Notes (Signed)
[facility] DEPT Provider Note   CSN: [id number] Arrival date & time: 03/03/20  2353     History No chief complaint on file.   [name] is a 64 y.o. female.  The history is provided by the patient, medical records and a relative. No language interpreter was used.   [name] is a 64 y.o. female who presents to the Emergency Department complaining of skin breakdown. She presents the emergency department accompanied by her niece for evaluation of breakdown in her groin and buttocks. She has a history of breast cancer, currently undergoing chemotherapy with last treatment on June 30 as well as lymphedema for lower extremities. She has very limited mobility at baseline and lives alone. Her niece comes check on her frequently and assists with toileting and cleaning. Last week she did have two days of diarrhea. Two days ago she developed sores and skin breakdown in her labia and buttocks. These are painful. She denies any fevers, abdominal pain, nausea, vomiting. She does have a history of chickenpox. She has not had shingles or the shingles vaccine. No history of oral or genital herpes.    Past Medical History:  Diagnosis Date  . Arthritis   . Cancer ([place]) 11/2019   Left breast  . Diverticulitis   . Family history of ovarian cancer   . Heart murmur    11/26/19 echo: Mild AS. AV mean gradient 12.0 mmHg; however, LVOT gradient 8 mmHg with intracvitary gradient-significant AS is not suspected  . Hypertension     Patient Active Problem List   Diagnosis Date Noted  . Gout 03/03/2020  . Port-A-Cath in place 02/19/2020  . Acute renal failure (ARF) ([place]) 01/22/2020  . S/P left mastectomy 12/19/2019  . Genetic testing 11/30/2019  . Morbid obesity with BMI of 60.0-69.9, adult ([place]) 11/21/2019  . Family history of ovarian cancer   . Malignant neoplasm of upper-outer quadrant of left breast in female, estrogen receptor positive ([place]) 11/14/2019  . Fracture  of radial head, right, closed 06/25/2019  . Frequent falls 01/10/2019  . Bilateral bunions 01/10/2019  . Toenail fungus 01/10/2019  . Alcoholic hepatitis 61/44/3154  . Edema 11/20/2018  . Alcohol-induced mood disorder ([place]) 11/01/2013  . Alcohol dependence ([place]) 11/01/2013  . Essential hypertension 06/20/2007  . LOW BACK PAIN 06/20/2007  . DIVERTICULOSIS, COLON 05/05/2007    Past Surgical History:  Procedure Laterality Date  . MASTECTOMY WITH AXILLARY LYMPH NODE DISSECTION Left 12/19/2019   Procedure: LEFT MASTECTOMY WITH AXILLARY LYMPH NODE DISSECTION;  Surgeon: [name], MD;  Location: [facility];  Service: General;  Laterality: Left;  Marland Kitchen MULTIPLE EXTRACTIONS WITH ALVEOLOPLASTY Bilateral 12/14/2019   Procedure: MULTIPLE EXTRACTION WITH ALVEOLOPLASTY;  Surgeon: [name], DDS;  Location: [facility] [place];  Service: Oral Surgery;  Laterality: Bilateral;  . PORTACATH PLACEMENT N/A 12/19/2019   Procedure: INSERTION PORT-A-CATH WITH ULTRASOUND GUIDANCE;  Surgeon: [name], MD;  Location: [facility] [place];  Service: General;  Laterality: N/A;  . RADIAL HEAD ARTHROPLASTY Right 06/25/2019   Procedure: RIGHT RADIAL HEAD ARTHROPLASTY;  Surgeon: [name], MD;  Location: [facility];  Service: Orthopedics;  Laterality: Right;  . TUBAL LIGATION       OB History   No obstetric history on file.     Family History  Problem Relation Age of Onset  . Hypertension Mother   . Dementia Mother   . Ovarian cancer Half-Sister 26  . Diabetes Half-Sister   . Stroke Half-Sister     Social History  Tobacco Use  . Smoking status: Never Smoker  . Smokeless tobacco: Never Used  Vaping Use  . Vaping Use: Never used  Substance Use Topics  . Alcohol use: Yes    Alcohol/week: 3.0 standard drinks    Types: 1 Cans of beer, 2 Shots of liquor per week    Comment: daily  . Drug use: Yes    Types: Marijuana    Home Medications Prior to Admission medications   Medication Sig Start Date End Date Taking?  Authorizing Provider  aspirin EC 81 MG EC tablet Take 1 tablet (81 mg total) by mouth daily. Swallow whole. 02/14/20   [name], MD  Blood Pressure Monitor DEVI Use as instructed by provider to check bloos pressure daily. ICD: I10 02/18/20   Fulp, Ander Gaster, MD  carvedilol (COREG) 12.5 MG tablet Take 1 tablet (12.5 mg total) by mouth 2 (two) times daily with a meal. 02/14/20   [name], MD  colchicine 0.6 MG tablet Take 0.5 tablets (0.3 mg total) by mouth every other day. 02/14/20   [name], MD  hydrALAZINE (APRESOLINE) 25 MG tablet Take 1 tablet (25 mg total) by mouth every 8 (eight) hours. 02/14/20   [name], MD  lidocaine-prilocaine (EMLA) cream Apply 1 application topically as needed. 02/05/20   Magrinat, Virgie Dad, MD  magic mouthwash SOLN Take 5 mLs by mouth 4 (four) times daily as needed for mouth pain. Swish and swallow or spit 02/25/20   Harle Stanford., PA-C  magic mouthwash w/lidocaine SOLN Take 5 mLs by mouth 3 (three) times daily as needed for mouth pain.    [provider]  ondansetron (ZOFRAN) 4 MG tablet Take 1 tablet (4 mg total) by mouth every 8 (eight) hours as needed for nausea or vomiting. 02/05/20   Magrinat, Virgie Dad, MD    Allergies    Amlodipine  Review of Systems   Review of Systems  All other systems reviewed and are negative.   Physical Exam Updated Vital Signs BP 107/64   Pulse 87   Temp 97.9 F (36.6 C) (Oral)   Resp (!) 22   Ht 5\' 1"  (1.549 m)   Wt (!) 145.2 kg   SpO2 100%   BMI 60.46 kg/m   Physical Exam Vitals and nursing note reviewed.  Constitutional:      Appearance: She is well-developed.  HENT:     Head: Normocephalic and atraumatic.  Cardiovascular:     Rate and Rhythm: Normal rate and regular rhythm.     Heart sounds: No murmur heard.   Pulmonary:     Effort: Pulmonary effort is normal. No respiratory distress.     Breath sounds: Normal breath sounds.  Abdominal:     Palpations: Abdomen  is soft.     Tenderness: There is no abdominal tenderness. There is no guarding or rebound.  Genitourinary:    Comments: There are numerous ulcerations to the lower abdominal Pan[place] as well as inner thighs and labia. Ulcerations are mostly 1 to 2 cm in size. There is sloughing of the skin to the posterior upper thigh bilaterally. There are a few scattered half a centimeter vesicles on the upper thigh Musculoskeletal:        General: Tenderness present.     Comments: 2+ pitting edema to bilateral lower extremities.  Skin:    General: Skin is warm and dry.  Neurological:     Mental Status: She is alert and oriented to person, place, and time.  Psychiatric:  Behavior: Behavior normal.     ED Results / Procedures / Treatments   Labs (all labs ordered are listed, but only abnormal results are displayed) Labs Reviewed  COMPREHENSIVE METABOLIC PANEL - Abnormal; Notable for the following components:      Result Value   CO2 21 (*)    Glucose, Bld 142 (*)    BUN 32 (*)    Creatinine, Ser 1.61 (*)    Calcium 8.2 (*)    Total Protein 5.8 (*)    Albumin 2.5 (*)    GFR calc non Af Amer 33 (*)    GFR calc Af Amer 39 (*)    All other components within normal limits  CBC WITH DIFFERENTIAL/PLATELET - Abnormal; Notable for the following components:   WBC 17.2 (*)    RBC 2.57 (*)    Hemoglobin 7.9 (*)    HCT 23.9 (*)    Platelets 127 (*)    Neutro Abs 13.7 (*)    Abs Immature Granulocytes 1.69 (*)    All other components within normal limits  URINALYSIS, ROUTINE W REFLEX MICROSCOPIC - Abnormal; Notable for the following components:   Hgb urine dipstick SMALL (*)    Bacteria, UA RARE (*)    All other components within normal limits  VIRUS CULTURE  CULTURE, BLOOD (ROUTINE X 2)  CULTURE, BLOOD (ROUTINE X 2)  URINE CULTURE  SARS CORONAVIRUS 2 BY RT PCR (HOSPITAL ORDER, [facility])  LACTIC ACID, PLASMA  LACTIC ACID, PLASMA    EKG None  Radiology No  results found.  Procedures Procedures (including critical care time)  Medications Ordered in ED Medications  valACYclovir (VALTREX) tablet 1,000 mg (1,000 mg Oral Given 03/03/20 1122)  oxyCODONE-acetaminophen (PERCOCET/ROXICET) 5-325 MG per tablet 1 tablet (1 tablet Oral Given 03/03/20 1040)    ED Course  I have reviewed the triage vital signs and the nursing notes.  Pertinent labs & imaging results that were available during my care of the patient were reviewed by me and considered in my medical decision making (see chart for details).    MDM Rules/Calculators/A&P                         Patient on current chemotherapy here for evaluation of groin pain. Examination has multiple ulcerations in sloughing of the skin as well as a few scattered vesicles. Will treat for possible herpetic infection. Given degree of skin breakdown feel that patient would benefit from a Foley catheter to keep the area dry and help with local healing. Given patient's discomfort and high risk for infection medicine consulted for observation admission. CBC does have leukocytosis, likely secondary to recent treatment. There is no current evidence of bacterial infection. Patient updated findings of studies and recommendation for admission and she is in agreement with treatment plan.  Final Clinical Impression(s) / ED Diagnoses Final diagnoses:  Skin breakdown    Rx / DC Orders ED Discharge Orders    None       Quintella Reichert, MD 03/03/20 1347

## 2020-03-03 NOTE — Consult Note (Addendum)
[facility] Nurse Consult Note: Reason for Consult: Consult requested for inner thighs and perineum.  Pt has been incontinent of urine frequently prior to admission, she states, and sits in a recliner for prolonged periods of time. She has a high BMI and it is difficult to prevent skin folds from rubbing together and trapping moisture against the skin. Wound type: Patchy areas of red moist full thickness skin loss to bilat inner thighs, inner perineum, and scattered partial thickness skin loss to buttocks.  Appearance is consistent with moisture associated skin damage, NOT pressure injuries.  Urine is cloudy with sediment; suspect pt has a UTI and acidic urine contributed to skin breakdown when she was incontinent.  Lab work is pending. Dressing procedure/placement/frequency: Topical treatment orders provided for bedside nurses to perform as follows to repel moisture and protect skin: Apply barrier cream and antifungal powder to inner thighs and perineum and buttocks with each turning and cleaning event. Order air mattress to increase airflow to promote drying and healing when pt is transferred from the ED to the floor.  Pt could benefit from leaving the foley in for several days to contain urine and promote healing the affected areas.  Please re-consult if further assistance is needed.  Thank-you,  Julien Girt MSN, [place], [place], [place], [place]

## 2020-03-03 NOTE — ED Notes (Signed)
Report given to Rickard Patience, RN

## 2020-03-03 NOTE — H&P (Signed)
History and Physical    [name] YSA:[id number] DOB: [birth year] DOA: 03/03/2020  PCP: [name], MD  Patient coming from: Home  I have personally briefly reviewed patient's old medical records in [place]  Chief Complaint: Pain in her bottom  HPI: [name] is a 64 y.o. female with medical history significant of T2N1 stage IIb, grade 3 ER positive breast cancer status post mastectomy in April 2021 with negative margins undergoing adjuvant chemotherapy, Adriamycin plus cyclophosphamide, last dose given last week, morbid obesity (very poorly mobile and requires help to get up off the bed), lymphedema, poor wound healing, venous insufficiency, stage III chronic kidney disease, hypertension who was recently admitted in the middle of June for cellulitis.  She currently lives alone and has family member who comes in to take care of her.  She is incontinent of urine at all times.  If she has a BM someone comes to help clean her up.  Over the last 4 days she has had increasing pain and asked to get in the shower where the caregiver noted significant skin breakdown.  The caregiver has been using moisture barrier cream and antifungal's in these areas but has not gotten much success.  Her pain is poorly controlled at home.  ED Course: In the ED she was noted to have a lot of skin breakdown maceration, and several vesicles were noted. She was found to have an elevated white count of 17.2 (up from 1.3 on 6/30), a low hemoglobin at 7.9 (down from 8.5 on 6/30), low platelet count of 127 (up from 71 on 6/30).  Her creatinine was 1.61 (up from 1.37 on 6/30).  A Foley catheter was placed urinalysis showed small blood but was otherwise negative.  Given her white count the patient was pan-cultured, lactic acid was performed and was elevated at 2.7.  She was given IV hydration and started on antibiotics.  Review of Systems: As per HPI otherwise 10 point review of systems negative.   Past Medical  History:  Diagnosis Date  . Arthritis   . Cancer ([facility]) 11/2019   Left breast  . Diverticulitis   . Family history of ovarian cancer   . Heart murmur    11/26/19 echo: Mild AS. AV mean gradient 12.0 mmHg; however, LVOT gradient 8 mmHg with intracvitary gradient-significant AS is not suspected  . Hypertension     Past Surgical History:  Procedure Laterality Date  . MASTECTOMY WITH AXILLARY LYMPH NODE DISSECTION Left 12/19/2019   Procedure: LEFT MASTECTOMY WITH AXILLARY LYMPH NODE DISSECTION;  Surgeon: [name], MD;  Location: [facility];  Service: General;  Laterality: Left;  Marland Kitchen MULTIPLE EXTRACTIONS WITH ALVEOLOPLASTY Bilateral 12/14/2019   Procedure: MULTIPLE EXTRACTION WITH ALVEOLOPLASTY;  Surgeon: [name], DDS;  Location: [facility];  Service: Oral Surgery;  Laterality: Bilateral;  . PORTACATH PLACEMENT N/A 12/19/2019   Procedure: INSERTION PORT-A-CATH WITH ULTRASOUND GUIDANCE;  Surgeon: [name], MD;  Location: [facility];  Service: General;  Laterality: N/A;  . RADIAL HEAD ARTHROPLASTY Right 06/25/2019   Procedure: RIGHT RADIAL HEAD ARTHROPLASTY;  Surgeon: [name], MD;  Location: [facility];  Service: Orthopedics;  Laterality: Right;  . TUBAL LIGATION       reports that she has never smoked. She has never used smokeless tobacco. She reports current alcohol use of about 3.0 standard drinks of alcohol per week. She reports current drug use. Drug: Marijuana.  Allergies  Allergen Reactions  . Amlodipine Swelling    BLE edema  Family History  Problem Relation Age of Onset  . Hypertension Mother   . Dementia Mother   . Ovarian cancer Half-Sister 60  . Diabetes Half-Sister   . Stroke Half-Sister     Prior to Admission medications   Medication Sig Start Date End Date Taking? Authorizing Provider  aspirin EC 81 MG EC tablet Take 1 tablet (81 mg total) by mouth daily. Swallow whole. 02/14/20   [name], MD  Blood Pressure Monitor DEVI Use as instructed by  provider to check bloos pressure daily. ICD: I10 02/18/20   Fulp, Ander Gaster, MD  carvedilol (COREG) 12.5 MG tablet Take 1 tablet (12.5 mg total) by mouth 2 (two) times daily with a meal. 02/14/20   [name], MD  colchicine 0.6 MG tablet Take 0.5 tablets (0.3 mg total) by mouth every other day. 02/14/20   [name], MD  hydrALAZINE (APRESOLINE) 25 MG tablet Take 1 tablet (25 mg total) by mouth every 8 (eight) hours. 02/14/20   [name], MD  lidocaine-prilocaine (EMLA) cream Apply 1 application topically as needed. 02/05/20   Magrinat, Virgie Dad, MD  magic mouthwash SOLN Take 5 mLs by mouth 4 (four) times daily as needed for mouth pain. Swish and swallow or spit 02/25/20   Harle Stanford., PA-C  magic mouthwash w/lidocaine SOLN Take 5 mLs by mouth 3 (three) times daily as needed for mouth pain.    [provider]  ondansetron (ZOFRAN) 4 MG tablet Take 1 tablet (4 mg total) by mouth every 8 (eight) hours as needed for nausea or vomiting. 02/05/20   Magrinat, Virgie Dad, MD    Physical Exam: Vitals:   03/03/20 0941 03/03/20 0942 03/03/20 1000  BP: 138/71  107/64  Pulse: 93  87  Resp: (!) 25  (!) 22  Temp: 97.9 F (36.6 C)    TempSrc: Oral    SpO2: 100%  100%  Weight:  (!) 145.2 kg   Height:  5\' 1"  (1.549 m)     Constitutional: NAD, calm, comfortable, morbidly obese Eyes: PERRL, lids and conjunctivae normal ENMT: Mucous membranes are moist. Posterior pharynx clear of any exudate or lesions.Normal dentition.  Neck: normal, supple, no masses, no thyromegaly Respiratory: clear to auscultation bilaterally, no wheezing, no crackles. Normal respiratory effort. No accessory muscle use.  Cardiovascular: Regular rate and rhythm, no murmurs / rubs / gallops.  2+ pitting  lower extremity edema. 2+ pedal pulses. No carotid bruits.  Abdomen: no tenderness, no masses palpated. No hepatosplenomegaly. Bowel sounds positive.  Musculoskeletal: no clubbing / cyanosis. No joint  deformity upper and lower extremities. Good ROM, no contractures. Normal muscle tone.  Skin: Dry skin, skin breakdown around the pannus the inner thighs, posterior thighs lesions are approximately 1 to 2 cm, buttocks, few subcentimeter vesicles noted, skin sloughing on the posterior upper thigh bilaterally Neurologic: CN 2-12 grossly intact.  Oriented x 3 Psychiatric: Normal judgment and insight. Alert and oriented x 3. Normal mood.   Labs on Admission: I have personally reviewed following labs and imaging studies  CBC: Recent Labs  Lab 02/27/20 1159 03/03/20 1021  WBC 1.3* 17.2*  NEUTROABS 0.8* 13.7*  HGB 8.5* 7.9*  HCT 25.7* 23.9*  MCV 92.8 93.0  PLT 71* 601*   Basic Metabolic Panel: Recent Labs  Lab 02/27/20 1159 03/03/20 1021  NA 137 135  K 4.6 3.9  CL 106 101  CO2 19* 21*  GLUCOSE 102* 142*  BUN 28* 32*  CREATININE 1.37* 1.61*  CALCIUM 8.6* 8.2*  GFR: Estimated Creatinine Clearance: 48.4 mL/min (A) (by C-G formula based on SCr of 1.61 mg/dL (H)). Liver Function Tests: Recent Labs  Lab 02/27/20 1159 03/03/20 1021  AST 8* 15  ALT 13 15  ALKPHOS 69 52  BILITOT 0.9 0.3  PROT 5.9* 5.8*  ALBUMIN 2.5* 2.5*   Urine analysis:    Component Value Date/Time   COLORURINE YELLOW 03/03/2020 1120   APPEARANCEUR CLEAR 03/03/2020 1120   LABSPEC 1.008 03/03/2020 1120   PHURINE 5.0 03/03/2020 1120   GLUCOSEU NEGATIVE 03/03/2020 1120   HGBUR SMALL (A) 03/03/2020 [place] 03/03/2020 [place] 03/03/2020 1120   PROTEINUR NEGATIVE 03/03/2020 1120   UROBILINOGEN 1.0 07/14/2012 1630   NITRITE NEGATIVE 03/03/2020 1120   LEUKOCYTESUR NEGATIVE 03/03/2020 1120    Assessment/Plan Active Problems:   Essential hypertension   Edema   Malignant neoplasm of upper-outer quadrant of left breast in female, estrogen receptor positive ([place])   Morbid obesity with BMI of 60.0-69.9, adult ([facility])   Acute renal failure (ARF) ([facility])   Gout   Skin  breakdown   Skin ulcer of perineum, limited to breakdown of skin ([facility])   Leukocytosis   Anemia associated with chemotherapy   Thrombocytopenia ([facility]) Skin breakdown/cellulitis Multiple causes including poor mobility, incontinence of urine, limited help at home, morbid obesity, lymphedema, poor wound healing related to all the above.  Patient is not known to be diabetic, random blood sugar here is 142.  Given elevated lactate will begin cefazolin IV. Consult wound Indwelling Foley for now to keep area dry.  This cannot be continued long-term  Hypertension Continue carvedilol 12.5 mg twice dailyC  Edema  Morbid obesity/deconditioning/poor mobility Presently lives alone, with family coming in to help.  Does not have all the equipment she would need to continue to care for self at home.  Living situation will be assessed by social work PT consult Consider SNF placement  T2 N1 stage IIb grade 3 ER positive breast cancer Scheduled for next chemotherapy session on 7/6, this will need to be delayed  Acute renal failure Baseline creatinine last year was about 0.8, has been as high as 3 during this past year.  It is elevated now with an elevated BUN as well IV hydration Avoid nephrotoxic agents   Gout Continue colchicine 0.3 every other day, not having side effects at present May consider allopurinol  Leukocytosis Question infection related to her skin Pain cultures pending On cefazolin May be related to Neulasta   Anemia Related to ongoing chemotherapy Transfuse for hemoglobin less than 7  Thrombocytopenia Related to ongoing chemotherapy, improved from last check, will follow   DVT prophylaxis: Lovenox SQ Code Status: Full code confirmed with her niece and the patient Family Communication: Patient and her niece at bedside Disposition Plan: Tentative questionable SNF Consults called: None Admission status: Inpatient for pain control and IV antibiotics.   Donnamae Jude  MD [facility]  If 7PM-7AM, please contact night-coverage 03/03/2020, 12:25 PM

## 2020-03-03 NOTE — ED Triage Notes (Signed)
Pt arrived via POV c/o skin breakdown and sores to buttocks and genital area. States she has been more immobile at home and increasing inability to care for self. States she has help at home from niece, but not continuously and lives at home alone. Several painful open areas of skin break down to inner thighs and labia.

## 2020-03-03 NOTE — ED Notes (Signed)
CRITICAL VALUE STICKER  CRITICAL VALUE: Lactic 2.7   RECEIVER (on-site recipient of call):  [place] NOTIFIED: today now   MESSENGER (representative from lab): Tammy   MD NOTIFIED: Kennon Rounds and [place] RN

## 2020-03-03 NOTE — ED Notes (Signed)
Peri care upon arrival. Applied moisture barrier cream. Purewick applied

## 2020-03-03 NOTE — Progress Notes (Addendum)
Pt arrived to unit in stable condition, skin check done w Amy RN  Skin issues as follows:  Diffuse / generalized dry flaky skin  Feet extremely dry, bottom of feet peeling completely off, small tears w scant amount of bleeding noted on bottom of R foot  About 20-30 small ulcerative areas spread throughout pannus, groin, upper legs, and in buttocks--located in folds of these ares. Two large areas of skin breakdown noted on backside under buttocks. Pink, raw skin  Wounds cleansed w gentle soap and water, pillow cases placed in folds, clear barrier ointment applied to clean wounds, foley placed in ED for wound healing  wctm

## 2020-03-03 NOTE — Progress Notes (Signed)
[facility]  [phone] [phone]     ID: [name] DOB: 27-Dec-1955  MR#: [id number]  TMH#:[id number]  Patient Care Team: [name], MD as PCP - General (Family Medicine) Mauro Kaufmann, RN as Oncology Nurse Navigator Rockwell Germany, RN as Oncology Nurse Navigator [name], MD as Consulting Physician (General Surgery) Uchechukwu Dhawan, Virgie Dad, MD as Consulting Physician (Oncology) Kyung Rudd, MD as Consulting Physician (Radiation Oncology) Edrick Kins, DPM as Consulting Physician (Podiatry) Chauncey Cruel, MD OTHER MD:  CHIEF COMPLAINT: estrogen receptor positive breast cancer  CURRENT TREATMENT: Adjuvant chemotherapy    INTERVAL HISTORY: [name] was scheduled today for follow up and treatment of her estrogen receptor positive breast cancer.  However she did not show.  She started her adjuvant chemotherapy, consisting of cyclophosphamide and doxorubicin in dose dense fashion x4, at 17% dose reduction, on 02/05/2020.  Today is cycle 3 day 1 of treatment.    Her most recent echocardiogram on 02/11/2020 showed an ejection fraction of 70-75%.   REVIEW OF SYSTEMS: [name]    HISTORY OF CURRENT ILLNESS: From the original intake note:  [name] (pronounced "yellow") had routine screening mammography on 10/24/2019 showing a possible abnormality in the left breast. She underwent left breast ultrasonography at [facility] on 11/05/2019 showing: suspicious 3.8 cm mass in left breast at 3 o'clock; five abnormal lymph nodes.  Accordingly on 11/12/2019 she proceeded to biopsy of the left breast area in question as well as a lymph node. The pathology from this procedure ([id number]) showed: invasive ductal carcinoma with prominent inflammatory response, grade 3. Prognostic indicators significant for: estrogen receptor, 100% positive and progesterone receptor, 50% positive, both with strong staining intensity. Proliferation marker  Ki67 at 70%. HER2 negative by immunohistochemistry (0).  The biopsied lymph node confirmed metastatic breast carcinoma.  The patient's subsequent history is as detailed below.   PAST MEDICAL HISTORY: Past Medical History:  Diagnosis Date  . Arthritis   . Cancer ([place]) 11/2019   Left breast  . Diverticulitis   . Family history of ovarian cancer   . Heart murmur    11/26/19 echo: Mild AS. AV mean gradient 12.0 mmHg; however, LVOT gradient 8 mmHg with intracvitary gradient-significant AS is not suspected  . Hypertension     PAST SURGICAL HISTORY: Past Surgical History:  Procedure Laterality Date  . MASTECTOMY WITH AXILLARY LYMPH NODE DISSECTION Left 12/19/2019   Procedure: LEFT MASTECTOMY WITH AXILLARY LYMPH NODE DISSECTION;  Surgeon: [name], MD;  Location: [place];  Service: General;  Laterality: Left;  Marland Kitchen MULTIPLE EXTRACTIONS WITH ALVEOLOPLASTY Bilateral 12/14/2019   Procedure: MULTIPLE EXTRACTION WITH ALVEOLOPLASTY;  Surgeon: [name], DDS;  Location: [place];  Service: Oral Surgery;  Laterality: Bilateral;  . PORTACATH PLACEMENT N/A 12/19/2019   Procedure: INSERTION PORT-A-CATH WITH ULTRASOUND GUIDANCE;  Surgeon: [name], MD;  Location: [place];  Service: General;  Laterality: N/A;  . RADIAL HEAD ARTHROPLASTY Right 06/25/2019   Procedure: RIGHT RADIAL HEAD ARTHROPLASTY;  Surgeon: [name], MD;  Location: [place];  Service: Orthopedics;  Laterality: Right;  . TUBAL LIGATION      FAMILY HISTORY: Family History  Problem Relation Age of Onset  . Hypertension Mother   . Dementia Mother   . Ovarian cancer Half-Sister 38  . Diabetes Half-Sister   . Stroke Half-Sister   Her father died at age 69; she is unsure of the cause of death. Her mother is age 48 as of 10/2019.  The patient  has two brothers and four sisters. She reports ovarian cancer in her sister at age 65. She denies a family history of breast, prostate, or pancreatic cancer.   GYNECOLOGIC HISTORY:  No  LMP recorded. Patient is postmenopausal. Menarche: 64 years old Age at first live birth: 64 years old [place] 2 LMP around age 49 Contraceptive: used previously HRT never used  Hysterectomy? no BSO? no   SOCIAL HISTORY: (updated 10/2019)  [name] retired from working at the [employer]. She is divorced. She lives at home alone but during the pandemic her sister Manus Gunning and mother Raford Pitcher lived in the same home. [name], age 2, lives in [place] and is handicapped. [name], age 80, is a Biomedical scientist here in [place]. [name] has two granddaughters. She attends a local [employer].    ADVANCED DIRECTIVES: not in place; at the 11/21/2019 visit the patient was given the appropriate documents to complete and notarized at her discretion.  She expressed spontaneously that she would not want to be resuscitated in case of a terminal event.    HEALTH MAINTENANCE: Social History   Tobacco Use  . Smoking status: Never Smoker  . Smokeless tobacco: Never Used  Vaping Use  . Vaping Use: Never used  Substance Use Topics  . Alcohol use: Yes    Alcohol/week: 3.0 standard drinks    Types: 1 Cans of beer, 2 Shots of liquor per week    Comment: daily  . Drug use: Yes    Types: Marijuana     Colonoscopy: never done  PAP: "1980?"  Bone density: never done   Allergies  Allergen Reactions  . Amlodipine Swelling    BLE edema    No current facility-administered medications for this visit.   No current outpatient medications on file.   Facility-Administered Medications Ordered in Other Visits  Medication Dose Route Frequency Provider Last Rate Last Admin  . 0.9 %  sodium chloride infusion   Intravenous PRN Donnamae Jude, MD 10 mL/hr at 03/04/20 0600 Rate Verify at 03/04/20 0600  . 0.9 %  sodium chloride infusion   Intravenous Continuous Raiford Noble [place], [place] 75 mL/hr at 03/04/20 1430 New Bag at 03/04/20 1430  . acetaminophen (TYLENOL) tablet 650 mg  650 mg Oral Q6H PRN Donnamae Jude,  MD       Or  . acetaminophen (TYLENOL) suppository 650 mg  650 mg Rectal Q6H PRN Donnamae Jude, MD      . aspirin EC tablet 81 mg  81 mg Oral Daily Donnamae Jude, MD   81 mg at 03/04/20 1055  . carvedilol (COREG) tablet 12.5 mg  12.5 mg Oral BID WC Donnamae Jude, MD   12.5 mg at 03/04/20 0740  . ceFAZolin (ANCEF) IVPB 1 g/50 mL premix  1 g Intravenous Q8H Donnamae Jude, MD 100 mL/hr at 03/04/20 1440 1 g at 03/04/20 1440  . Chlorhexidine Gluconate Cloth 2 % PADS 6 each  6 each Topical Daily Raiford Noble [place], [place]   6 each at 03/04/20 1055  . colchicine tablet 0.3 mg  0.3 mg Oral Newman Nip, MD   0.3 mg at 03/04/20 1211  . enoxaparin (LOVENOX) injection 60 mg  60 mg Subcutaneous Q24H Sheikh, Omair Latif, DO      . hydrALAZINE (APRESOLINE) tablet 25 mg  25 mg Oral Q8H Donnamae Jude, MD   25 mg at 03/04/20 0700  . HYDROmorphone (DILAUDID) injection 0.5-1 mg  0.5-1 mg  Intravenous Q2H PRN Donnamae Jude, MD   1 mg at 03/04/20 1438  . magic mouthwash  5 mL Oral QID PRN Donnamae Jude, MD      . magnesium sulfate 3 g in dextrose 5 % 100 mL IVPB  3 g Intravenous Once Raiford Noble [place], DO 53 mL/hr at 03/04/20 1528 3 g at 03/04/20 1528  . metoprolol tartrate (LOPRESSOR) injection 5 mg  5 mg Intravenous Q6H PRN Donnamae Jude, MD      . ondansetron [facility]) tablet 4 mg  4 mg Oral Q8H PRN Donnamae Jude, MD      . polyethylene glycol (MIRALAX / GLYCOLAX) packet 17 g  17 g Oral Daily PRN Donnamae Jude, MD      . valACYclovir (VALTREX) tablet 1,000 mg  1,000 mg Oral BID Donnamae Jude, MD   1,000 mg at 03/04/20 1055    OBJECTIVE: African-American woman   There were no vitals filed for this visit.   There is no height or weight on file to calculate BMI.   Wt Readings from Last 3 Encounters:  03/03/20 (!) 320 lb (145.2 kg)  02/27/20 (!) 320 lb 3.2 oz (145.2 kg)  02/19/20 (!) 315 lb 8 oz (143.1 kg)     ECOG FS:2 - Symptomatic, <50% confined to bed     LAB RESULTS:  CMP       Component Value Date/Time   NA 136 03/04/2020 1110   NA 135 01/02/2020 1710   K 3.9 03/04/2020 1110   CL 102 03/04/2020 1110   CO2 22 03/04/2020 1110   GLUCOSE 120 (H) 03/04/2020 1110   BUN 30 (H) 03/04/2020 1110   BUN 25 01/02/2020 1710   CREATININE 1.51 (H) 03/04/2020 1110   CREATININE 1.59 (H) 11/21/2019 0908   CALCIUM 7.9 (L) 03/04/2020 1110   PROT 5.3 (L) 03/04/2020 1110   PROT 8.5 06/15/2019 1622   ALBUMIN 2.3 (L) 03/04/2020 1110   ALBUMIN 4.3 06/15/2019 1622   AST 11 (L) 03/04/2020 1110   AST 61 (H) 11/21/2019 0908   ALT 13 03/04/2020 1110   ALT 32 11/21/2019 0908   ALKPHOS 54 03/04/2020 1110   BILITOT 0.6 03/04/2020 1110   BILITOT 0.5 11/21/2019 0908   GFRNONAA 36 (L) 03/04/2020 1110   GFRNONAA 34 (L) 11/21/2019 0908   GFRAA 42 (L) 03/04/2020 1110   GFRAA 39 (L) 11/21/2019 0908    No results found for: TOTALPROTELP, ALBUMINELP, A1GS, A2GS, BETS, BETA2SER, GAMS, MSPIKE, SPEI  Lab Results  Component Value Date   WBC 17.6 (H) 03/04/2020   NEUTROABS 14.1 (H) 03/04/2020   HGB 7.3 (L) 03/04/2020   HCT 22.5 (L) 03/04/2020   MCV 94.9 03/04/2020   PLT 161 03/04/2020    No results found for: LABCA2  No components found for: ZWCHEN277  No results for input(s): INR in the last 168 hours.  No results found for: LABCA2  No results found for: OEU235  No results found for: TIR443  No results found for: XVQ008  No results found for: CA2729  No components found for: HGQUANT  No results found for: CEA1 / No results found for: CEA1   No results found for: AFPTUMOR  No results found for: CHROMOGRNA  No results found for: KPAFRELGTCHN, LAMBDASER, KAPLAMBRATIO (kappa/lambda light chains)  No results found for: HGBA, HGBA2QUANT, HGBFQUANT, HGBSQUAN (Hemoglobinopathy evaluation)   No results found for: LDH  Lab Results  Component Value Date   IRON 111 01/22/2020  TIBC 231 (L) 01/22/2020   IRONPCTSAT 48 (H) 01/22/2020   (Iron and TIBC)  Lab Results   Component Value Date   FERRITIN 3000 (H) 11/10/2007    Urinalysis    Component Value Date/Time   COLORURINE YELLOW 03/03/2020 1120   APPEARANCEUR CLEAR 03/03/2020 1120   LABSPEC 1.008 03/03/2020 1120   PHURINE 5.0 03/03/2020 1120   GLUCOSEU NEGATIVE 03/03/2020 1120   HGBUR SMALL (A) 03/03/2020 1120   BILIRUBINUR NEGATIVE 03/03/2020 1120   KETONESUR NEGATIVE 03/03/2020 1120   PROTEINUR NEGATIVE 03/03/2020 1120   UROBILINOGEN 1.0 07/14/2012 1630   NITRITE NEGATIVE 03/03/2020 1120   LEUKOCYTESUR NEGATIVE 03/03/2020 1120     STUDIES: DG Ankle 2 Views Left  Result Date: 02/11/2020 CLINICAL DATA:  Swelling and warmth EXAM: LEFT ANKLE: 2 VIEW COMPARISON:  November 04, 2018 FINDINGS: Frontal and lateral views were obtained. There is marked soft tissue swelling. No fracture or joint effusion. No appreciable joint space narrowing. There is a posterior calcaneal spur. No erosive change or bony destruction. IMPRESSION: Diffuse soft tissue swelling. No fracture or bony destruction. No erosion. No appreciable joint space narrowing. There is a posterior calcaneal spur. Electronically Signed   By: Lowella Grip III M.D.   On: 02/11/2020 10:22   DG Chest Portable 1 View  Result Date: 02/10/2020 CLINICAL DATA:  Breast carcinoma with recent blood transfusion. Concern for potential sepsis EXAM: PORTABLE CHEST 1 VIEW COMPARISON:  December 19, 2019 FINDINGS: Port-A-Cath tip is in the superior vena cava. No pneumothorax. The lungs are clear. The heart is mildly enlarged with pulmonary vascularity normal. No adenopathy. There are surgical clips in the left axillary region. IMPRESSION: Port-A-Cath as described without pneumothorax. Lungs clear. Mild cardiomegaly. No evident adenopathy. Electronically Signed   By: Lowella Grip III M.D.   On: 02/10/2020 09:53   DG Foot 2 Views Left  Result Date: 02/11/2020 CLINICAL DATA:  Soft tissue swelling and warmth EXAM: LEFT FOOT - 2 VIEW COMPARISON:  November 04, 2018  FINDINGS: Frontal and lateral views were obtained. There is marked soft tissue swelling. No evident fracture or dislocation. There is degenerative change with narrowing of the first MTP joint. Other joint spaces appear unremarkable. There is a posterior calcaneal spur. No erosive change or bony destruction. Bones appear somewhat osteoporotic. IMPRESSION: Marked soft tissue swelling. No bony destruction or erosion. Narrowing first MTP joint. Other joint spaces appear unremarkable. There is a posterior calcaneal spur. Electronically Signed   By: Lowella Grip III M.D.   On: 02/11/2020 10:18   ECHOCARDIOGRAM COMPLETE  Result Date: 02/11/2020    ECHOCARDIOGRAM REPORT   Patient Name:   [name] Date of Exam: 02/11/2020 Medical Rec #:  [id number]     Height:       61.0 in Accession #:    [id number]    Weight:       307.0 lb Date of Birth:  [birth year]    BSA:          2.266 m Patient Age:    67 years      BP:           164/71 mmHg Patient Gender: F             HR:           123 bpm. Exam Location:  Inpatient Procedure: 2D Echo, Cardiac Doppler, Color Doppler and Intracardiac            Opacification Agent Indications:    Hypertensive emergency  History:        Patient has prior history of Echocardiogram examinations, most                 recent 01/22/2020. Risk Factors:Hypertension and Non-Smoker.                 Breast cancer.  Sonographer:    Vickie Epley RDCS Referring Phys: [id number] [place]  1. Left ventricular ejection fraction, by estimation, is 70 to 75%. The left ventricle has hyperdynamic function. The left ventricle has no regional wall motion abnormalities. Left ventricular diastolic parameters are consistent with Grade I diastolic dysfunction (impaired relaxation). Elevated left ventricular end-diastolic pressure.  2. Right ventricular systolic function is normal. The right ventricular size is normal. Tricuspid regurgitation signal is inadequate for assessing PA pressure.  3. The  mitral valve is normal in structure. No evidence of mitral valve regurgitation. No evidence of mitral stenosis.  4. The aortic valve is normal in structure. Aortic valve regurgitation is not visualized. No aortic stenosis is present.  5. The inferior vena cava is normal in size with greater than 50% respiratory variability, suggesting right atrial pressure of 3 mmHg. FINDINGS  Left Ventricle: Left ventricular ejection fraction, by estimation, is 70 to 75%. The left ventricle has hyperdynamic function. The left ventricle has no regional wall motion abnormalities. Definity contrast agent was given IV to delineate the left ventricular endocardial borders. The left ventricular internal cavity size was normal in size. There is no left ventricular hypertrophy. Left ventricular diastolic parameters are consistent with Grade I diastolic dysfunction (impaired relaxation). Elevated left ventricular end-diastolic pressure. Right Ventricle: The right ventricular size is normal. No increase in right ventricular wall thickness. Right ventricular systolic function is normal. Tricuspid regurgitation signal is inadequate for assessing PA pressure. Left Atrium: Left atrial size was normal in size. Right Atrium: Right atrial size was normal in size. Pericardium: There is no evidence of pericardial effusion. Mitral Valve: The mitral valve is normal in structure. Normal mobility of the mitral valve leaflets. No evidence of mitral valve regurgitation. No evidence of mitral valve stenosis. Tricuspid Valve: The tricuspid valve is normal in structure. Tricuspid valve regurgitation is trivial. No evidence of tricuspid stenosis. Aortic Valve: The aortic valve is normal in structure. Aortic valve regurgitation is not visualized. No aortic stenosis is present. Pulmonic Valve: The pulmonic valve was normal in structure. Pulmonic valve regurgitation is not visualized. No evidence of pulmonic stenosis. Aorta: The aortic root is normal in size and  structure. Venous: The inferior vena cava is normal in size with greater than 50% respiratory variability, suggesting right atrial pressure of 3 mmHg. IAS/Shunts: No atrial level shunt detected by color flow Doppler.  LEFT VENTRICLE PLAX 2D LVIDd:         3.80 cm  Diastology LVIDs:         2.30 cm  LV e' lateral:   8.49 cm/s LV PW:         0.80 cm  LV E/e' lateral: 13.9 LV IVS:        0.80 cm  LV e' medial:    7.18 cm/s LVOT diam:     1.70 cm  LV E/e' medial:  16.4 LV SV:         51 LV SV Index:   23 LVOT Area:     2.27 cm  RIGHT VENTRICLE RV S prime:     16.40 cm/s TAPSE (M-mode): 1.9 cm LEFT ATRIUM  Index       RIGHT ATRIUM           Index LA diam:        3.30 cm 1.46 cm/m  RA Area:     12.60 cm LA Vol (A2C):   39.3 ml 17.34 ml/m RA Volume:   27.30 ml  12.05 ml/m LA Vol (A4C):   38.0 ml 16.77 ml/m LA Biplane Vol: 41.6 ml 18.36 ml/m  AORTIC VALVE LVOT Vmax:   170.00 cm/s LVOT Vmean:  121.000 cm/s LVOT VTI:    0.225 m  AORTA Ao Root diam: 2.60 cm MITRAL VALVE MV Area (PHT): 5.75 cm     SHUNTS MV Decel Time: 132 msec     Systemic VTI:  0.22 m MV E velocity: 118.00 cm/s  Systemic Diam: 1.70 cm MV A velocity: 159.00 cm/s MV E/A ratio:  0.74 Fransico Him MD Electronically signed by Fransico Him MD Signature Date/Time: 02/11/2020/9:20:01 AM    Final    VAS [place] LOWER EXTREMITY VENOUS (DVT) (ONLY [facility] & [facility])  Result Date: 02/10/2020  Lower Venous DVTStudy Indications: Swelling.  Limitations: Body habitus. Comparison Study: 11/04/18 negative Performing Technologist: June Leap RDMS, RVT  Examination Guidelines: A complete evaluation includes B-mode imaging, spectral Doppler, color Doppler, and power Doppler as needed of all accessible portions of each vessel. Bilateral testing is considered an integral part of a complete examination. Limited examinations for reoccurring indications may be performed as noted. The reflux portion of the exam is performed with the patient in reverse Trendelenburg.   +---------+---------------+---------+-----------+----------+--------------+ LEFT     CompressibilityPhasicitySpontaneityPropertiesThrombus Aging +---------+---------------+---------+-----------+----------+--------------+ CFV      Full           Yes      Yes                                 +---------+---------------+---------+-----------+----------+--------------+ SFJ      Full                                                        +---------+---------------+---------+-----------+----------+--------------+ FV Prox  Full                                                        +---------+---------------+---------+-----------+----------+--------------+ FV Mid   Full                                                        +---------+---------------+---------+-----------+----------+--------------+ FV DistalFull                                                        +---------+---------------+---------+-----------+----------+--------------+ PFV  Not visualized +---------+---------------+---------+-----------+----------+--------------+ POP      Full           Yes      Yes                                 +---------+---------------+---------+-----------+----------+--------------+ PTV      Full                                                        +---------+---------------+---------+-----------+----------+--------------+ PERO                                                  Not visualized +---------+---------------+---------+-----------+----------+--------------+     Summary: LEFT: - There is no evidence of deep vein thrombosis in the lower extremity. However, portions of this examination were limited- see technologist comments above.  *See table(s) above for measurements and observations. Electronically signed by Monica Martinez MD on 02/10/2020 at 11:15:19 AM.    Final      ELIGIBLE FOR AVAILABLE  RESEARCH PROTOCOL: AET  ASSESSMENT: 64 y.o. [place] woman status post left breast upper outer quadrant and left axillary lymph node biopsies 11/12/2019 for a clinical T2 N2, stage IIIA invasive ductal carcinoma, grade 3, estrogen and progesterone receptor positive, HER-2 not amplified, with an MIB-1 of 70%  (a) the biopsied axillary lymph node was positive  (1) genetics testing 11/29/2019 through the Breast Cancer STAT Panel offered by [facility] found no deleterious mutations in ATM, BRCA1, BRCA2, CDH1, CHEK2, PALB2, PTEN, STK11 and TP53.  Additional testing through the Common Hereditary Cancers Panel confirmed no deleterious mutations in APC, ATM, AXIN2, BARD1, BMPR1A, BRCA1, BRCA2, BRIP1, CDH1, CDK4, CDKN2A (p14ARF), CDKN2A (p16INK4a), CHEK2, CTNNA1, DICER1, EPCAM (Deletion/duplication testing only), GREM1 (promoter region deletion/duplication testing only), KIT, MEN1, MLH1, MSH2, MSH3, MSH6, MUTYH, NBN, NF1, NHTL1, PALB2, PDGFRA, PMS2, POLD1, POLE, PTEN, RAD50, RAD51C, RAD51D, RNF43, SDHB, SDHC, SDHD, SMAD4, SMARCA4. STK11, TP53, TSC1, TSC2, and VHL.  The following genes were evaluated for sequence changes only: SDHA and HOXB13 c.251G>A variant only.  (2) status post left modified radical mastectomy 12/19/2019 for a pT2 pN1, stage IIB invasive ductal carcinoma, grade 3, with negative margins.  (a) 1 of 13 axillary lymph nodes removed was positive  (3) adjuvant chemotherapy will consist of cyclophosphamide and doxorubicin in dose dense fashion x4 starting 01/22/2020 followed by weekly paclitaxel x12  (a) baseline echocardiogram 11/26/2019 shows an ejection fraction in the 65-70% range.  (b) first cycle of cyclophosphamide and doxorubicin dose reduced to assess tolerance  (4) adjuvant radiation as appropriate  (5) antiestrogens to follow   PLAN: Edia  did not show for her visit 03/04/2020.  I have rescheduled her treatment for 03/11/2020.  Letter has been sent.   Virgie Dad. Maginat, MD  03/04/20 4:07 PM Medical Oncology and Hematology [facility] [place], [place] [address] Tel. [phone]    Fax. [phone]   I, Wilburn Mylar, am acting as scribe for Dr. Virgie Dad. Lory Galan.  I, Lurline Del MD, have reviewed the above documentation for accuracy and completeness, and I agree with the above.   *Total Encounter  Time as defined by the Centers for Medicare and Medicaid Services includes, in addition to the face-to-face time of a patient visit (documented in the note above) non-face-to-face time: obtaining and reviewing outside history, ordering and reviewing medications, tests or procedures, care coordination (communications with other health care professionals or caregivers) and documentation in the medical record.

## 2020-03-04 ENCOUNTER — Ambulatory Visit: Payer: Medicaid Other

## 2020-03-04 ENCOUNTER — Other Ambulatory Visit: Payer: Medicaid Other

## 2020-03-04 ENCOUNTER — Ambulatory Visit (HOSPITAL_BASED_OUTPATIENT_CLINIC_OR_DEPARTMENT_OTHER): Payer: Medicaid Other | Admitting: Oncology

## 2020-03-04 ENCOUNTER — Encounter: Payer: Self-pay | Admitting: Oncology

## 2020-03-04 DIAGNOSIS — D6481 Anemia due to antineoplastic chemotherapy: Secondary | ICD-10-CM | POA: Diagnosis not present

## 2020-03-04 DIAGNOSIS — Z6841 Body Mass Index (BMI) 40.0 and over, adult: Secondary | ICD-10-CM

## 2020-03-04 DIAGNOSIS — I10 Essential (primary) hypertension: Secondary | ICD-10-CM | POA: Diagnosis not present

## 2020-03-04 DIAGNOSIS — M1A09X0 Idiopathic chronic gout, multiple sites, without tophus (tophi): Secondary | ICD-10-CM | POA: Diagnosis not present

## 2020-03-04 DIAGNOSIS — D72829 Elevated white blood cell count, unspecified: Secondary | ICD-10-CM | POA: Diagnosis not present

## 2020-03-04 DIAGNOSIS — R238 Other skin changes: Secondary | ICD-10-CM | POA: Diagnosis not present

## 2020-03-04 DIAGNOSIS — C50412 Malignant neoplasm of upper-outer quadrant of left female breast: Secondary | ICD-10-CM

## 2020-03-04 DIAGNOSIS — T451X5A Adverse effect of antineoplastic and immunosuppressive drugs, initial encounter: Secondary | ICD-10-CM

## 2020-03-04 DIAGNOSIS — D696 Thrombocytopenia, unspecified: Secondary | ICD-10-CM | POA: Diagnosis not present

## 2020-03-04 DIAGNOSIS — Z170 Estrogen receptor positive status [ER+]: Secondary | ICD-10-CM

## 2020-03-04 LAB — URINE CULTURE: Culture: <10000 — AB

## 2020-03-04 LAB — COMPREHENSIVE METABOLIC PANEL
ALT: 13 U/L (ref 0–44)
AST: 11 U/L — ABNORMAL LOW (ref 15–41)
Albumin: 2.3 g/dL — ABNORMAL LOW (ref 3.5–5.0)
Alkaline Phosphatase: 54 U/L (ref 38–126)
Anion gap: 12 (ref 5–15)
BUN: 30 mg/dL — ABNORMAL HIGH (ref 8–23)
CO2: 22 mmol/L (ref 22–32)
Calcium: 7.9 mg/dL — ABNORMAL LOW (ref 8.9–10.3)
Chloride: 102 mmol/L (ref 98–111)
Creatinine, Ser: 1.51 mg/dL — ABNORMAL HIGH (ref 0.44–1.00)
GFR calc Af Amer: 42 mL/min — ABNORMAL LOW (ref >60)
GFR calc non Af Amer: 36 mL/min — ABNORMAL LOW (ref >60)
Glucose, Bld: 120 mg/dL — ABNORMAL HIGH (ref 70–99)
Potassium: 3.9 mmol/L (ref 3.5–5.1)
Sodium: 136 mmol/L (ref 135–145)
Total Bilirubin: 0.6 mg/dL (ref 0.3–1.2)
Total Protein: 5.3 g/dL — ABNORMAL LOW (ref 6.5–8.1)

## 2020-03-04 LAB — CBC WITH DIFFERENTIAL/PLATELET
Abs Immature Granulocytes: 1.79 10*3/uL — ABNORMAL HIGH (ref 0.00–0.07)
Basophils Absolute: 0.1 10*3/uL (ref 0.0–0.1)
Basophils Relative: 0 %
Eosinophils Absolute: 0 10*3/uL (ref 0.0–0.5)
Eosinophils Relative: 0 %
HCT: 22.5 % — ABNORMAL LOW (ref 36.0–46.0)
Hemoglobin: 7.3 g/dL — ABNORMAL LOW (ref 12.0–15.0)
Immature Granulocytes: 10 %
Lymphocytes Relative: 5 %
Lymphs Abs: 0.8 10*3/uL (ref 0.7–4.0)
MCH: 30.8 pg (ref 26.0–34.0)
MCHC: 32.4 g/dL (ref 30.0–36.0)
MCV: 94.9 fL (ref 80.0–100.0)
Monocytes Absolute: 0.8 10*3/uL (ref 0.1–1.0)
Monocytes Relative: 5 %
Neutro Abs: 14.1 10*3/uL — ABNORMAL HIGH (ref 1.7–7.7)
Neutrophils Relative %: 80 %
Platelets: 161 10*3/uL (ref 150–400)
RBC: 2.37 MIL/uL — ABNORMAL LOW (ref 3.87–5.11)
RDW: 15.7 % — ABNORMAL HIGH (ref 11.5–15.5)
WBC: 17.6 10*3/uL — ABNORMAL HIGH (ref 4.0–10.5)
nRBC: 0.1 % (ref 0.0–0.2)

## 2020-03-04 LAB — PHOSPHORUS: Phosphorus: 2.7 mg/dL (ref 2.5–4.6)

## 2020-03-04 LAB — MAGNESIUM: Magnesium: 1.3 mg/dL — ABNORMAL LOW (ref 1.7–2.4)

## 2020-03-04 LAB — LACTIC ACID, PLASMA: Lactic Acid, Venous: 1 mmol/L (ref 0.5–1.9)

## 2020-03-04 MED ORDER — MAGNESIUM SULFATE 50 % IJ SOLN
3.0000 g | Freq: Once | INTRAVENOUS | Status: AC
  Administered 2020-03-04: 3 g via INTRAVENOUS
  Filled 2020-03-04: qty 6

## 2020-03-04 MED ORDER — CHLORHEXIDINE GLUCONATE CLOTH 2 % EX PADS
6.0000 | MEDICATED_PAD | Freq: Every day | CUTANEOUS | Status: DC
  Administered 2020-03-04 – 2020-03-21 (×16): 6 via TOPICAL

## 2020-03-04 MED ORDER — ENOXAPARIN SODIUM 60 MG/0.6ML ~~LOC~~ SOLN
60.0000 mg | SUBCUTANEOUS | Status: DC
  Administered 2020-03-04 – 2020-03-20 (×17): 60 mg via SUBCUTANEOUS
  Filled 2020-03-04 (×15): qty 0.6

## 2020-03-04 MED ORDER — SODIUM CHLORIDE 0.9 % IV SOLN: INTRAVENOUS | Status: AC

## 2020-03-04 MED ORDER — SODIUM CHLORIDE 0.9 % IV SOLN
INTRAVENOUS | Status: DC | PRN
  Administered 2020-03-05: 1000 mL via INTRAVENOUS

## 2020-03-04 MED ORDER — DIPHENHYDRAMINE HCL 25 MG PO CAPS
25.0000 mg | ORAL_CAPSULE | Freq: Four times a day (QID) | ORAL | Status: DC | PRN
  Administered 2020-03-05 – 2020-03-20 (×5): 25 mg via ORAL
  Filled 2020-03-04 (×4): qty 1

## 2020-03-04 NOTE — Evaluation (Signed)
Physical Therapy Evaluation Patient Details Name: [name] MRN: [id number] DOB: 11-Apr-1956 Today's Date: 03/04/2020   History of Present Illness  64 yo female admitted with skin breakdown/cellulitis, weakness, difficulty functioning at home. Hx of breast ca s/p mastectomy, lymphedema, poor wound healing, venous insufficiency, CKD, morbid obesity, falls  Clinical Impression  On eval, pt required Max assist +2 for mobility. Pt briefly sat EOB before requesting to return to supine. High fall risk 2* pt is of short stature and bed height/air mattress. May have to consider using lift equipment to transfer pt to recliner in order to safely work on standing/ambulation. Will follow during hospital stay. At this time, recommendation is for SNF.      Follow Up Recommendations SNF;Supervision/Assistance - 24 hour    Equipment Recommendations  None recommended by PT    Recommendations for Other Services       Precautions / Restrictions Precautions Precautions: Fall Restrictions Weight Bearing Restrictions: No      Mobility  Bed Mobility Overal bed mobility: Needs Assistance Bed Mobility: Supine to Sit;Sit to Supine     Supine to sit: Max assist;+2 for physical assistance;+2 for safety/equipment;HOB elevated Sit to supine: Max assist;+2 for physical assistance;+2 for safety/equipment;HOB elevated   General bed mobility comments: Assist for trunk and bil LEs. Utilized bedpad to aid with scooting, positioning. HOB elevated and pt relied on bedrail. Increased time. Pt fearful of falling. High fall risk 2* pt is of short stature and bed heightair mattress..  Transfers                    Ambulation/Gait                Stairs            Wheelchair Mobility    Modified Rankin (Stroke Patients Only)       Balance Overall balance assessment: Needs assistance Sitting-balance support: Bilateral upper extremity supported Sitting balance-Leahy Scale: Poor                                        Pertinent Vitals/Pain Pain Assessment: Faces Faces Pain Scale: Hurts whole lot Pain Location: "everywhere" Pain Descriptors / Indicators: Discomfort;Sore;Grimacing;Moaning Pain Intervention(s): Limited activity within patient's tolerance;Monitored during session;Repositioned    Home Living Family/patient expects to be discharged to:: Unsure Living Arrangements: Other relatives Available Help at Discharge: Family;Available 24 hours/day Type of Home: Apartment Home Access: Level entry     Home Layout: One level Home Equipment: Grab bars - toilet;Grab bars - tub/shower;Wheelchair - [employer] - 2 wheels;Cane - single point;Shower seat      Prior Function Level of Independence: Needs assistance   Gait / Transfers Assistance Needed: Reports can ambulate community distances with RW  ADL's / Homemaking Assistance Needed: niece helps with ADLs daily  Comments: Reports hardest thing is getting up and down; Typically sleeps in straight back chair with foot stool     Hand Dominance        Extremity/Trunk Assessment   Upper Extremity Assessment Upper Extremity Assessment: Defer to OT evaluation    Lower Extremity Assessment Lower Extremity Assessment: Generalized weakness RLE Deficits / Details: grossly WFL but NT due to inability to test standing LLE Deficits / Details: grossly WFL but NT due to inability to test standing    Cervical / Trunk Assessment Cervical / Trunk Exceptions: some limitations in trunk and extremity mobility  due to body habitus  Communication   Communication: No difficulties  Cognition Arousal/Alertness: Awake/alert Behavior During Therapy: WFL for tasks assessed/performed Overall Cognitive Status: Within Functional Limits for tasks assessed                                        General Comments      Exercises     Assessment/Plan    PT Assessment Patient needs continued PT  services  PT Problem List Decreased strength;Decreased mobility;Decreased range of motion;Decreased activity tolerance;Decreased balance;Decreased knowledge of use of DME;Pain;Obesity;Decreased skin integrity       PT Treatment Interventions DME instruction;Gait training;Therapeutic activities;Therapeutic exercise;Patient/family education;Balance training;Functional mobility training    PT Goals (Current goals can be found in the Care Plan section)  Acute Rehab PT Goals Patient Stated Goal: less pain. get better PT Goal Formulation: With patient Time For Goal Achievement: 03/18/20 Potential to Achieve Goals: Fair    Frequency Min 2X/week   Barriers to discharge        Co-evaluation               AM-PAC PT "6 Clicks" Mobility  Outcome Measure Help needed turning from your back to your side while in a flat bed without using bedrails?: A Lot Help needed moving from lying on your back to sitting on the side of a flat bed without using bedrails?: A Lot Help needed moving to and from a bed to a chair (including a wheelchair)?: Total Help needed standing up from a chair using your arms (e.g., wheelchair or bedside chair)?: Total Help needed to walk in hospital room?: Total Help needed climbing 3-5 steps with a railing? : Total 6 Click Score: 8    End of Session   Activity Tolerance: Patient limited by fatigue;Patient limited by pain Patient left: in bed;with call bell/phone within reach   PT Visit Diagnosis: Muscle weakness (generalized) (M62.81);Pain;Other abnormalities of gait and mobility (R26.89)    Time: 2094-7096 PT Time Calculation (min) (ACUTE ONLY): 18 min   Charges:   PT Evaluation $PT Eval Low Complexity: [place], PT Acute Rehabilitation  Office: [phone] Pager: [phone]

## 2020-03-04 NOTE — Progress Notes (Signed)
PROGRESS NOTE    [name]  NGE:[id number] DOB: 08/17/1956 DOA: 03/03/2020 PCP: [name], MD   Brief Narrative:  HPI per Dr. Darron Doom on 03/03/20 [name] is a 64 y.o. female with medical history significant of T2N1 stage IIb, grade 3 ER positive breast cancer status post mastectomy in April 2021 with negative margins undergoing adjuvant chemotherapy, Adriamycin plus cyclophosphamide, last dose given last week, morbid obesity (very poorly mobile and requires help to get up off the bed), lymphedema, poor wound healing, venous insufficiency, stage III chronic kidney disease, hypertension who was recently admitted in the middle of June for cellulitis.  She currently lives alone and has family member who comes in to take care of her.  She is incontinent of urine at all times.  If she has a BM someone comes to help clean her up.  Over the last 4 days she has had increasing pain and asked to get in the shower where the caregiver noted significant skin breakdown.  The caregiver has been using moisture barrier cream and antifungal's in these areas but has not gotten much success.  Her pain is poorly controlled at home.  ED Course: In the ED she was noted to have a lot of skin breakdown maceration, and several vesicles were noted. She was found to have an elevated white count of 17.2 (up from 1.3 on 6/30), a low hemoglobin at 7.9 (down from 8.5 on 6/30), low platelet count of 127 (up from 71 on 6/30).  Her creatinine was 1.61 (up from 1.37 on 6/30).  A Foley catheter was placed urinalysis showed small blood but was otherwise negative.  Given her white count the patient was pan-cultured, lactic acid was performed and was elevated at 2.7.  She was given IV hydration and started on antibiotics.  **Interim History Continues to does have some pain.  Had wound care see her and they made recommendations.  PT to further evaluate and treat patient feels fatigued and extremely weak.  Assessment &  Plan:   Active Problems:   Essential hypertension   Edema   Malignant neoplasm of upper-outer quadrant of left breast in female, estrogen receptor positive ([place])   Morbid obesity with BMI of 60.0-69.9, adult (HCC)   Acute renal failure (ARF) (HCC)   Gout   Skin breakdown   Skin ulcer of perineum, limited to breakdown of skin (HCC)   Leukocytosis   Anemia associated with chemotherapy   Thrombocytopenia (HCC)  Skin Breakdown/MASD/Cellulitis in the [employer] and Perineum -Multiple causes including poor mobility, incontinence of urine, limited help at home, morbid obesity, lymphedema, poor wound healing related to all the above.   -Patient is not known to be diabetic, random blood sugar here is 142.   -Given elevated lactate will begin cefazolin IV. -Consulted Wound Care -Indwelling Foley for now to keep area dry for now.  This cannot be continued long-term -C/w Topicals per [place]  -WBC was elevated but could be from Neulasta -Will check PCT and MRSA PCR with low threshold to de-escalate and Stop Abx -PT recommending SNF  Hypertension -Continue Carvedilol 12.5 mg twice daily -Continue to monitor blood pressures per protocol -Last BP was 122/75  Deconditioning/Poor mobility/Generalized Weakness Presently lives alone, with family coming in to help.  Does not have all the equipment she would need to continue to care for self at home.  Living situation will be assessed by social work -PT OT recommending SNF  T2 N1 stage IIb grade 3 ER positive  breast cancer -Scheduled for next chemotherapy session on 7/6, this will need to be delayed given her acute hospitalization  Acute kidney injury Metabolic acidosis -Baseline creatinine last year was about 0.8, has been as high as 3 during this past year.  -It is elevated now with an elevated BUN as well patient is BUN/trending up from 32/1.61 and is now 30/1.51 -An LR bolus yesterday and will continue IV fluid hydration with normal  saline at 75 MLS per hour for 16 more hours -Her metabolic acidosis improved as her CO2 was 21 yesterday, anion gap is 13, chloride level is 101; now CO2 is 22, anion gap is 12, and -Avoid nephrotoxic medications, contrast dyes, hypotension and renally adjust medications -Repeat CMP in the a.m.  Lactic Acidosis -Patient's lactic acid level on admission was 2.7 and after IV fluid hydration has improved to 1.0 -Continue IV fluid hydration with normal saline at 75 MLS per hour for another 16 more hours  Gout -Continue colchicine 0.3 every other day, not having side effects at present -May consider allopurinol when Renal Fxn further improves  Leukocytosis -Question infection related to her skin but likely related to her  -SARS coronavirus 2 testing was negative by PCR -Urinalysis was unremarkable with a clear appearance with yellow color urine, small hemoglobin, negative ketones, negative nitrites, negative leukocytes, rare bacteria, 0-5 RBCs per high-power field and 0-5 WBCs -Urine culture less than 10,000 colonies of insignificant growth -Blood cultures x2 are pending -Chest x-ray was not done on admission but will check on in the a.m. -Currently on empiric antibiotics with IV cefazolin -Leukocytosis is likely related to Neulasta  -Continue to monitor and trend and repeat CBC in a.m.  Normocytic Anemia -Related to ongoing chemotherapy -Transfuse for hemoglobin less than 7 -Patient's hemoglobin/hematocrit went from 8.5/25.7 down to 7.9/23.9 is now 7.3/22.5 today -Check anemia panel in the a.m. -Continue to monitor for signs and symptoms of bleeding; currently no overt bleeding noted -Repeat CBC in a.m.  Hypomagnesemia -Patient's magnesium level was 1.3 -Replete with IV mag sulfate 3 g -Continue to monitor and replete as necessary -Repeat magnesium level in the a.m.  Thrombocytopenia -Related to ongoing chemotherapy -Improved from last check and her platelet count has gone  from 71-1 27 yesterday and today it is 161 -We will increase the dose of subcu Lovenox next-continue to monitor for signs and symptoms of bleeding; currently no overt bleeding noted next-repeat CBC in a.m.  Super Morbid Obesity -Estimated body mass index is 60.46 kg/m as calculated from the following:   Height as of this encounter: 5\' 1"  (1.549 m).   Weight as of this encounter: 145.2 kg. -Continued Weight Loss and Dietary Counseling given    DVT prophylaxis: Enoxaparin 40 mg sq q24h Code Status: FULL CODE  Family Communication: No family present at bedside  Disposition Plan:  Pending Further Clinical Improvement and PT/OT recommend SNF  Status is: Inpatient   Remains inpatient appropriate because:Unsafe d/c plan, IV treatments appropriate due to intensity of illness or inability to take PO and Inpatient level of care appropriate due to severity of illness   Dispo: The patient is from: Home              Anticipated d/c is to: SNF              Anticipated d/c date is: 2 days              Patient currently is not medically stable to d/c.  Consultants:  None   Procedures:  None  Antimicrobials:  Anti-infectives (From admission, onward)   Start     Dose/Rate Route Frequency Ordered Stop   03/03/20 1400  ceFAZolin (ANCEF) IVPB 1 g/50 mL premix     Discontinue     1 g 100 mL/hr over 30 Minutes Intravenous Every 8 hours 03/03/20 1237     03/03/20 1115  valACYclovir (VALTREX) tablet 1,000 mg     Discontinue     1,000 mg Oral 2 times daily 03/03/20 1106       Subjective: Seen and examined at bedside she is resting but feeling uncomfortable and states that she had some pain in her buttocks.  No nausea or vomiting.  Denies any lightheadedness or dizziness.  No other concerns or complaints at this time but does feel weak.  Objective: Vitals:   03/03/20 1624 03/03/20 1800 03/03/20 2019 03/04/20 0627  BP: 133/75 109/62 111/63 122/75  Pulse: 94 91 87 89  Resp: 19 20 19 20    Temp: 97.9 F (36.6 C) (!) 97.5 F (36.4 C) 97.7 F (36.5 C) 97.9 F (36.6 C)  TempSrc: Oral Oral Oral Oral  SpO2: 100% 100% 100% 100%  Weight:      Height:        Intake/Output Summary (Last 24 hours) at 03/04/2020 1410 Last data filed at 03/04/2020 1100 Gross per 24 hour  Intake 1100.42 ml  Output 1276 ml  Net -175.58 ml   Filed Weights   03/03/20 0942  Weight: (!) 145.2 kg   Examination: Physical Exam:  Constitutional: WN/WD super morbidly obese African-American female currently in NAD and appears calm but does appear uncomfortable Eyes: Lids and conjunctivae normal, sclerae anicteric  ENMT: External Ears, Nose appear normal. Grossly normal hearing.  Neck: Appears normal, supple, no cervical masses, normal ROM, no appreciable thyromegaly; no JVD Respiratory: Diminished to auscultation bilaterally, no wheezing, rales, rhonchi or crackles. Normal respiratory effort and patient is not tachypenic. No accessory muscle use.  Unlabored breathing Cardiovascular: RRR, no murmurs / rubs / gallops. S1 and S2 auscultated.  Has some lower extremity edema Abdomen: Soft, non-tender, distended secondary body habitus. Bowel sounds positive.  GU: Deferred. Musculoskeletal: No clubbing / cyanosis of digits/nails. No joint deformity upper and lower extremities.  Skin: She would not let me turn her to view her backside but does have moisture associated damage throughout the pannus; Warm and dry.  Neurologic: CN 2-12 grossly intact with no focal deficits. Romberg sign and cerebellar reflexes not assessed.  Psychiatric: Normal judgment and insight. Alert and oriented x 3. Normal mood and appropriate affect.   Data Reviewed: I have personally reviewed following labs and imaging studies  CBC: Recent Labs  Lab 02/27/20 1159 03/03/20 1021 03/04/20 1110  WBC 1.3* 17.2* 17.6*  NEUTROABS 0.8* 13.7* 14.1*  HGB 8.5* 7.9* 7.3*  HCT 25.7* 23.9* 22.5*  MCV 92.8 93.0 94.9  PLT 71* 127* 119   Basic  Metabolic Panel: Recent Labs  Lab 02/27/20 1159 03/03/20 1021 03/04/20 1110  NA 137 135 136  K 4.6 3.9 3.9  CL 106 101 102  CO2 19* 21* 22  GLUCOSE 102* 142* 120*  BUN 28* 32* 30*  CREATININE 1.37* 1.61* 1.51*  CALCIUM 8.6* 8.2* 7.9*  MG  --   --  1.3*  PHOS  --   --  2.7   GFR: Estimated Creatinine Clearance: 51.6 mL/min (A) (by C-G formula based on SCr of 1.51 mg/dL (H)). Liver Function Tests: Recent Labs  Lab  02/27/20 1159 03/03/20 1021 03/04/20 1110  AST 8* 15 11*  ALT 13 15 13   ALKPHOS 69 52 54  BILITOT 0.9 0.3 0.6  PROT 5.9* 5.8* 5.3*  ALBUMIN 2.5* 2.5* 2.3*   No results for input(s): LIPASE, AMYLASE in the last 168 hours. No results for input(s): AMMONIA in the last 168 hours. Coagulation Profile: No results for input(s): INR, PROTIME in the last 168 hours. Cardiac Enzymes: No results for input(s): CKTOTAL, CKMB, CKMBINDEX, TROPONINI in the last 168 hours. BNP (last 3 results) No results for input(s): PROBNP in the last 8760 hours. HbA1C: Recent Labs    03/03/20 1018  HGBA1C 6.1*   CBG: No results for input(s): GLUCAP in the last 168 hours. Lipid Profile: No results for input(s): CHOL, HDL, LDLCALC, TRIG, CHOLHDL, LDLDIRECT in the last 72 hours. Thyroid Function Tests: No results for input(s): TSH, T4TOTAL, FREET4, T3FREE, THYROIDAB in the last 72 hours. Anemia Panel: No results for input(s): VITAMINB12, FOLATE, FERRITIN, TIBC, IRON, RETICCTPCT in the last 72 hours. Sepsis Labs: Recent Labs  Lab 03/03/20 1120 03/03/20 1313 03/04/20 1111  LATICACIDVEN 2.7* 2.1* 1.0    Recent Results (from the past 240 hour(s))  Culture, blood (routine x 2)     Status: None (Preliminary result)   Collection Time: 03/03/20 11:20 AM   Specimen: BLOOD  Result Value Ref Range Status   Specimen Description   Final    BLOOD RIGHT ANTECUBITAL Performed at [facility] [address] [place], [place] [address]    Special Requests   Final     BOTTLES DRAWN AEROBIC AND ANAEROBIC Blood Culture adequate volume Performed at [facility] [address] [place], [place] [address]    Culture   Final    NO GROWTH 1 DAY Performed at [facility], [place] [address], [place], [place] [address]    Report Status PENDING  Incomplete  Culture, blood (routine x 2)     Status: None (Preliminary result)   Collection Time: 03/03/20 11:20 AM   Specimen: BLOOD  Result Value Ref Range Status   Specimen Description   Final    BLOOD PORTA CATH Performed at [facility] [address] [place], [place] [address]    Special Requests   Final    BOTTLES DRAWN AEROBIC AND ANAEROBIC Blood Culture adequate volume Performed at [facility] [address], [place], [place] [address]    Culture   Final    NO GROWTH 1 DAY Performed at [facility], [place] [address] [place], [place] [address]    Report Status PENDING  Incomplete  Urine culture     Status: Abnormal   Collection Time: 03/03/20 11:20 AM   Specimen: Urine, Clean Catch  Result Value Ref Range Status   Specimen Description   Final    URINE, CLEAN CATCH Performed at [facility], [place] [address], [place], [place] [address]    Special Requests   Final    NONE Performed at [facility], [place] [address], [place], [place] [address]    Culture (A)  Final    <10,000 COLONIES/mL INSIGNIFICANT GROWTH Performed at [facility] [address] [place], [place] [address]    Report Status 03/04/2020 FINAL  Final  SARS Coronavirus 2 by RT PCR (hospital order, performed in [facility] hospital lab) Nasopharyngeal Nasopharyngeal Swab     Status: None   Collection Time: 03/03/20 11:20 AM  Specimen: Nasopharyngeal Swab  Result Value Ref Range Status   SARS Coronavirus 2 NEGATIVE NEGATIVE Final    Comment: (NOTE) SARS-CoV-2 target nucleic acids are NOT DETECTED.  The SARS-CoV-2 RNA is  generally detectable in upper and lower respiratory specimens during the acute phase of infection. The lowest concentration of SARS-CoV-2 viral copies this assay can detect is 250 copies / mL. A negative result does not preclude SARS-CoV-2 infection and should not be used as the sole basis for treatment or other patient management decisions.  A negative result may occur with improper specimen collection / handling, submission of specimen other than nasopharyngeal swab, presence of viral mutation(s) within the areas targeted by this assay, and inadequate number of viral copies (<250 copies / mL). A negative result must be combined with clinical observations, patient history, and epidemiological information.  Fact Sheet for Patients:   StrictlyIdeas.no  Fact Sheet for Healthcare Providers: BankingDealers.co.za  This test is not yet approved or  cleared by the [place] FDA and has been authorized for detection and/or diagnosis of SARS-CoV-2 by FDA under an Emergency Use Authorization (EUA).  This EUA will remain in effect (meaning this test can be used) for the duration of the COVID-19 declaration under Section 564(b)(1) of the Act, 21 U.S.C. section 360bbb-3(b)(1), unless the authorization is terminated or revoked sooner.  Performed at [facility], [place] [address], [place], [place] [address]     RN Pressure Injury Documentation: Pressure Injury 12/19/19 Tibial Distal;Left;Posterior Stage 2 -  Partial thickness loss of dermis presenting as a shallow open injury with a red, pink wound bed without slough. Pink, red (Active)  12/19/19 1903  Location: Tibial  Location Orientation: Distal;Left;Posterior  Staging: Stage 2 -  Partial thickness loss of dermis presenting as a shallow open injury with a red, pink wound bed without slough.  Wound Description (Comments): Pink, red  Present on Admission: Yes     Pressure  Injury 12/19/19 Sacrum Medial Stage 2 -  Partial thickness loss of dermis presenting as a shallow open injury with a red, pink wound bed without slough. Pink, red (Active)  12/19/19 1904  Location: Sacrum  Location Orientation: Medial  Staging: Stage 2 -  Partial thickness loss of dermis presenting as a shallow open injury with a red, pink wound bed without slough.  Wound Description (Comments): Pink, red  Present on Admission: Yes     Pressure Injury 03/03/20 Buttocks Posterior Stage 3 -  Full thickness tissue loss. Subcutaneous fat may be visible but bone, tendon or muscle are NOT exposed. (Active)  03/03/20 2308  Location: Buttocks  Location Orientation: Posterior  Staging: Stage 3 -  Full thickness tissue loss. Subcutaneous fat may be visible but bone, tendon or muscle are NOT exposed.  Wound Description (Comments):   Present on Admission: Yes     Pressure Injury 03/03/20 Buttocks Stage 3 -  Full thickness tissue loss. Subcutaneous fat may be visible but bone, tendon or muscle are NOT exposed. (Active)  03/03/20 2308  Location: Buttocks  Location Orientation:   Staging: Stage 3 -  Full thickness tissue loss. Subcutaneous fat may be visible but bone, tendon or muscle are NOT exposed.  Wound Description (Comments):   Present on Admission: Yes     Estimated body mass index is 60.46 kg/m as calculated from the following:   Height as of this encounter: 5\' 1"  (1.549 m).   Weight as of this encounter: 145.2 kg.  Malnutrition Type:  Malnutrition Characteristics:      Nutrition Interventions:    Radiology Studies: No results found.  Scheduled Meds:  aspirin EC  81 mg Oral Daily   carvedilol  12.5 mg Oral BID WC   Chlorhexidine Gluconate Cloth  6 each Topical Daily   colchicine  0.3 mg Oral QODAY   enoxaparin (LOVENOX) injection  40 mg Subcutaneous Q24H   hydrALAZINE  25 mg Oral Q8H   valACYclovir  1,000 mg Oral BID   Continuous Infusions:  sodium  chloride 10 mL/hr at 03/04/20 0600    ceFAZolin (ANCEF) IV 1 g (03/04/20 0816)    LOS: 1 day   Kerney Elbe, DO [facility] PAGER is on AMION  If 7PM-7AM, please contact night-coverage [email or web address]

## 2020-03-04 NOTE — Plan of Care (Signed)
  Problem: Education: Goal: Knowledge of General Education information will improve Description: Including pain rating scale, medication(s)/side effects and non-pharmacologic comfort measures Outcome: Progressing   Problem: Safety: Goal: Ability to remain free from injury will improve Outcome: Progressing   Problem: Skin Integrity: Goal: Risk for impaired skin integrity will decrease Outcome: Progressing   

## 2020-03-04 NOTE — Consult Note (Addendum)
  WOC consult requested; this was already performed yesterday; refer to progress note on 7/5. Topical treatment orders have been provided for bedside nurses to perform to protect and promote healing.  Please re-consult if further assistance is needed.  Thank-you,  Julien Girt MSN, [place], [place], [employer], [place]

## 2020-03-05 ENCOUNTER — Telehealth: Payer: Self-pay | Admitting: Oncology

## 2020-03-05 DIAGNOSIS — D72829 Elevated white blood cell count, unspecified: Secondary | ICD-10-CM | POA: Diagnosis not present

## 2020-03-05 DIAGNOSIS — I10 Essential (primary) hypertension: Secondary | ICD-10-CM | POA: Diagnosis not present

## 2020-03-05 DIAGNOSIS — N179 Acute kidney failure, unspecified: Secondary | ICD-10-CM | POA: Diagnosis not present

## 2020-03-05 DIAGNOSIS — T451X5A Adverse effect of antineoplastic and immunosuppressive drugs, initial encounter: Secondary | ICD-10-CM | POA: Diagnosis not present

## 2020-03-05 DIAGNOSIS — D6481 Anemia due to antineoplastic chemotherapy: Secondary | ICD-10-CM | POA: Diagnosis not present

## 2020-03-05 DIAGNOSIS — R238 Other skin changes: Secondary | ICD-10-CM | POA: Diagnosis not present

## 2020-03-05 LAB — MAGNESIUM: Magnesium: 1.9 mg/dL (ref 1.7–2.4)

## 2020-03-05 LAB — COMPREHENSIVE METABOLIC PANEL
ALT: 11 U/L (ref 0–44)
AST: 14 U/L — ABNORMAL LOW (ref 15–41)
Albumin: 2.4 g/dL — ABNORMAL LOW (ref 3.5–5.0)
Alkaline Phosphatase: 67 U/L (ref 38–126)
Anion gap: 8 (ref 5–15)
BUN: 29 mg/dL — ABNORMAL HIGH (ref 8–23)
CO2: 24 mmol/L (ref 22–32)
Calcium: 8.4 mg/dL — ABNORMAL LOW (ref 8.9–10.3)
Chloride: 105 mmol/L (ref 98–111)
Creatinine, Ser: 1.5 mg/dL — ABNORMAL HIGH (ref 0.44–1.00)
GFR calc Af Amer: 42 mL/min — ABNORMAL LOW (ref >60)
GFR calc non Af Amer: 36 mL/min — ABNORMAL LOW (ref >60)
Glucose, Bld: 136 mg/dL — ABNORMAL HIGH (ref 70–99)
Potassium: 3.8 mmol/L (ref 3.5–5.1)
Sodium: 137 mmol/L (ref 135–145)
Total Bilirubin: 0.6 mg/dL (ref 0.3–1.2)
Total Protein: 5.5 g/dL — ABNORMAL LOW (ref 6.5–8.1)

## 2020-03-05 LAB — FOLATE: Folate: 3.8 ng/mL — ABNORMAL LOW (ref >5.9)

## 2020-03-05 LAB — CBC WITH DIFFERENTIAL/PLATELET
Abs Immature Granulocytes: 2.5 10*3/uL — ABNORMAL HIGH (ref 0.00–0.07)
Band Neutrophils: 4 %
Basophils Absolute: 0 10*3/uL (ref 0.0–0.1)
Basophils Relative: 0 %
Eosinophils Absolute: 0 10*3/uL (ref 0.0–0.5)
Eosinophils Relative: 0 %
HCT: 22.3 % — ABNORMAL LOW (ref 36.0–46.0)
Hemoglobin: 7.3 g/dL — ABNORMAL LOW (ref 12.0–15.0)
Lymphocytes Relative: 6 %
Lymphs Abs: 1.4 10*3/uL (ref 0.7–4.0)
MCH: 31.2 pg (ref 26.0–34.0)
MCHC: 32.7 g/dL (ref 30.0–36.0)
MCV: 95.3 fL (ref 80.0–100.0)
Metamyelocytes Relative: 3 %
Monocytes Absolute: 0.9 10*3/uL (ref 0.1–1.0)
Monocytes Relative: 4 %
Myelocytes: 7 %
Neutro Abs: 18.2 10*3/uL — ABNORMAL HIGH (ref 1.7–7.7)
Neutrophils Relative %: 75 %
Platelets: 164 10*3/uL (ref 150–400)
Promyelocytes Relative: 1 %
RBC: 2.34 MIL/uL — ABNORMAL LOW (ref 3.87–5.11)
RDW: 15.8 % — ABNORMAL HIGH (ref 11.5–15.5)
WBC: 23.1 10*3/uL — ABNORMAL HIGH (ref 4.0–10.5)
nRBC: 0.2 % (ref 0.0–0.2)

## 2020-03-05 LAB — RETICULOCYTES
Immature Retic Fract: 23.1 % — ABNORMAL HIGH (ref 2.3–15.9)
RBC.: 2.33 MIL/uL — ABNORMAL LOW (ref 3.87–5.11)
Retic Count, Absolute: 18.4 10*3/uL — ABNORMAL LOW (ref 19.0–186.0)
Retic Ct Pct: 0.8 % (ref 0.4–3.1)

## 2020-03-05 LAB — IRON AND TIBC
Iron: 52 ug/dL (ref 28–170)
Saturation Ratios: 33 % — ABNORMAL HIGH (ref 10.4–31.8)
TIBC: 157 ug/dL — ABNORMAL LOW (ref 250–450)
UIBC: 105 ug/dL

## 2020-03-05 LAB — PHOSPHORUS: Phosphorus: 3 mg/dL (ref 2.5–4.6)

## 2020-03-05 LAB — FERRITIN: Ferritin: 861 ng/mL — ABNORMAL HIGH (ref 11–307)

## 2020-03-05 LAB — VITAMIN B12: Vitamin B-12: 2310 pg/mL — ABNORMAL HIGH (ref 180–914)

## 2020-03-05 MED ORDER — FOLIC ACID 1 MG PO TABS
1.0000 mg | ORAL_TABLET | Freq: Every day | ORAL | Status: DC
  Administered 2020-03-05 – 2020-03-21 (×17): 1 mg via ORAL
  Filled 2020-03-05 (×17): qty 1

## 2020-03-05 NOTE — Progress Notes (Signed)
PROGRESS NOTE    [name]  IPJ:[id number] DOB: Jun 27, 1956 DOA: 03/03/2020 PCP: [name], MD   Chef Complaints: Skin breakdown  Brief Narrative: 64 YO female with breast cancer status postmastectomy in April 2021 with negative margins undergoing adjuvant chemo Adriamycin plus cyclophosphamide last dose last week prior to admission, morbidly obese BMI 60, poorly mobile, lymphedema/venous insufficiency/CKD, hypertension recent admission for the inner thighs and perineum, who lives alone and a family member comes in to take care of her, has been incontinent with urine at all times noted to have a significant skin breakdown in the shower, sent to the ED for evaluation  In the ED found to have a lot of skin breakdown maceration several vesicles and also significant leukocytosis anemia, thrombocytopenia elevated BUN/creatinine of 1.6 up from 1.3 on June/30.  Foley catheter was placed, patient was admitted for IV antibiotics IV fluids hydration  Subjective: Seen and examined this morning.  Able to work with PT nursing staff and on the bedside chair. Foley in place no nausea vomiting but complains of itching. Leukocytosis further worsening currently clear from 17 K.  But afebrile.   Assessment & Plan:  Multiple skin breakdown cellulitis in the inner thighs and perineum abdominal pannus area with wound in different stages: Multifactorial with poor mobility/morbid obesity, incontinence of urine limited care at home.  Leukocytosis +,initially had lactic acidosis that has resolved.  Continue wound care continue Ancef IV, valtrex, continue Foley to keep area dry to augment in wound healing.  White count is worsening likely from the effect of Neulasta from 6/24 but is afebrile, blood culture no growth. Virus culture sent from the skin scraping.  MRSA screening Neg 3 weeks ago.  Leukocytosis multifactorial due to #1, also received Neulasta discussed with Dr. Jana Hakim who suspect likely from Neulasta.  Urine culture insignificant growth.  Blood culture negative so far.  Continue antibiotics as above and monitor CBC.  Essential hypertension: BP stable/controlled.  Continue Coreg, hydralazine, ASA.  AKI: Creatinine 0.8 posterior was 1.3 and up to.  Currently on 1.5.  Avoid nephrotoxic medication.  Monitor clinically and labs.  Metabolic acidosis: With AKI, bicarb improved to 24.   Folate deficiency/anemia of chronic disease hemoglobin 8.5  6/3-: On admission 7.9 g currently at 7.3 g.  Monitor closely transfuse as needed.  In the setting of folate deficiency/anemia of chronic disease with breast cancer .  Anemia panel shows elevated ferritin at 61, Low folate 3.8, B12-2310.  Add folate replacement.  Monitor hemoglobin closely.  Deconditioning/poor mobility/malaise weakness: Encourage ambulation continue PT OT, will need skilled nursing facility per PT OT.  T2 N1 stage IIb grade 3 ER positive breast cancer followed by medical oncology chemotherapy session 7/6 has been posted for 7/13 as patient did not go to outpatient follow-up instead came to the ED.  I have notified her oncology today.    Gout continue colchicine.  Thrombocytopenia likely from chemo.  Monitor platelet count.  Hypomagnesemia monitor.  Super morbid obesity with BMI 60.  Will benefit weight loss at the lifestyle  Pressure ulcer present on admission on buttock as below: We will continue wound care 03/03/20 2308  Location: Buttocks  Location Orientation: Posterior  Staging: Stage 3 -  Full thickness tissue loss. Subcutaneous fat may be visible but bone, tendon or muscle are NOT exposed.  Wound Description (Comments):   Present on Admission: Yes     Pressure Injury 03/03/20 Buttocks Stage 3 -  Full thickness tissue loss. Subcutaneous fat may be visible  but bone, tendon or muscle are NOT exposed. (Active)  03/03/20 2308  Location: Buttocks  Location Orientation:   Staging: Stage 3 -  Full thickness tissue loss.  Subcutaneous fat may be visible but bone, tendon or muscle are NOT exposed.  Wound Description (Comments):   Present on Admission: Yes   DVT prophylaxis: lovenox  Code Status: FULL Family Communication: plan of care discussed with patient at bedside.  Status is: Inpatient  Remains inpatient appropriate because:Unsafe d/c plan, IV treatments appropriate due to intensity of illness or inability to take PO and Inpatient level of care appropriate due to severity of illness   Dispo: The patient is from: Home              Anticipated d/c is to: SNF              Anticipated d/c date is: 2 days              Patient currently is not medically stable to d/c.        Nutrition: Diet Order            DIET SOFT Room service appropriate? Yes; Fluid consistency: Thin  Diet effective now                       Body mass index is 60.46 kg/m. Pressure Ulcer:   Consultants:see note  Procedures:see note Microbiology:see note  Medications: Scheduled Meds:  aspirin EC  81 mg Oral Daily   carvedilol  12.5 mg Oral BID WC   Chlorhexidine Gluconate Cloth  6 each Topical Daily   colchicine  0.3 mg Oral QODAY   enoxaparin (LOVENOX) injection  60 mg Subcutaneous Q24H   hydrALAZINE  25 mg Oral Q8H   valACYclovir  1,000 mg Oral BID   Continuous Infusions:  sodium chloride 1,000 mL (03/05/20 0644)    ceFAZolin (ANCEF) IV 1 g (03/05/20 0554)    Antimicrobials: Anti-infectives (From admission, onward)   Start     Dose/Rate Route Frequency Ordered Stop   03/03/20 1400  ceFAZolin (ANCEF) IVPB 1 g/50 mL premix     Discontinue     1 g 100 mL/hr over 30 Minutes Intravenous Every 8 hours 03/03/20 1237     03/03/20 1115  valACYclovir (VALTREX) tablet 1,000 mg     Discontinue     1,000 mg Oral 2 times daily 03/03/20 1106         Objective: Vitals: Today's Vitals   03/05/20 0236 03/05/20 0300 03/05/20 0608 03/05/20 1052  BP:   132/75   Pulse:   94   Resp:   18   Temp:   97.8  F (36.6 C)   TempSrc:   Oral   SpO2:   100%   Weight:      Height:      PainSc: 8  Asleep  8     Intake/Output Summary (Last 24 hours) at 03/05/2020 1354 Last data filed at 03/05/2020 0610 Gross per 24 hour  Intake 867.84 ml  Output 450 ml  Net 417.84 ml   Filed Weights   03/03/20 0942  Weight: (!) 145.2 kg   Weight change:    Intake/Output from previous day: 07/06 0701 - 07/07 0700 In: 867.8 [P.O.:240; I.V.:527.8; IV Piggyback:100] Out: 900 [Urine:900] Intake/Output this shift: No intake/output data recorded.  Examination:  General exam: AAO X3, morbidly obese,NAD, weak appearing. HEENT:Oral mucosa moist, Ear/Nose WNL grossly,dentition normal. Respiratory system: bilaterally clear,no wheezing or crackles,no  use of accessory muscle, non tender. Cardiovascular system: S1 & S2 +, regular, No JVD. Gastrointestinal system: Abdomen soft, abdominal obesity present with pannus underneath serous drainage present with wound at different stages/skin erosion, BS+. Nervous System:Alert, awake, moving extremities and grossly nonfocal Extremities: No edema, distal peripheral pulses palpable.  Skin: No rashes,no icterus.  Skin erosion/breakdown on ant abdomen skin and in inner thigh. SkinMSK: Normal muscle bulk,tone, power  Data Reviewed: I have personally reviewed following labs and imaging studies CBC: Recent Labs  Lab 03/03/20 1021 03/04/20 1110 03/05/20 0500  WBC 17.2* 17.6* 23.1*  NEUTROABS 13.7* 14.1* 18.2*  HGB 7.9* 7.3* 7.3*  HCT 23.9* 22.5* 22.3*  MCV 93.0 94.9 95.3  PLT 127* 161 016   Basic Metabolic Panel: Recent Labs  Lab 03/03/20 1021 03/04/20 1110 03/05/20 0500  NA 135 136 137  K 3.9 3.9 3.8  CL 101 102 105  CO2 21* 22 24  GLUCOSE 142* 120* 136*  BUN 32* 30* 29*  CREATININE 1.61* 1.51* 1.50*  CALCIUM 8.2* 7.9* 8.4*  MG  --  1.3* 1.9  PHOS  --  2.7 3.0   GFR: Estimated Creatinine Clearance: 51.9 mL/min (A) (by C-G formula based on SCr of 1.5 mg/dL  (H)). Liver Function Tests: Recent Labs  Lab 03/03/20 1021 03/04/20 1110 03/05/20 0500  AST 15 11* 14*  ALT 15 13 11   ALKPHOS 52 54 67  BILITOT 0.3 0.6 0.6  PROT 5.8* 5.3* 5.5*  ALBUMIN 2.5* 2.3* 2.4*   No results for input(s): LIPASE, AMYLASE in the last 168 hours. No results for input(s): AMMONIA in the last 168 hours. Coagulation Profile: No results for input(s): INR, PROTIME in the last 168 hours. Cardiac Enzymes: No results for input(s): CKTOTAL, CKMB, CKMBINDEX, TROPONINI in the last 168 hours. BNP (last 3 results) No results for input(s): PROBNP in the last 8760 hours. HbA1C: Recent Labs    03/03/20 1018  HGBA1C 6.1*   CBG: No results for input(s): GLUCAP in the last 168 hours. Lipid Profile: No results for input(s): CHOL, HDL, LDLCALC, TRIG, CHOLHDL, LDLDIRECT in the last 72 hours. Thyroid Function Tests: No results for input(s): TSH, T4TOTAL, FREET4, T3FREE, THYROIDAB in the last 72 hours. Anemia Panel: Recent Labs    03/05/20 0500  VITAMINB12 2,310*  FOLATE 3.8*  FERRITIN 861*  TIBC 157*  IRON 52  RETICCTPCT 0.8   Sepsis Labs: Recent Labs  Lab 03/03/20 1120 03/03/20 1313 03/04/20 1111  LATICACIDVEN 2.7* 2.1* 1.0    Recent Results (from the past 240 hour(s))  Virus culture     Status: None   Collection Time: 03/03/20 10:21 AM   Specimen: Skin Scraping  Result Value Ref Range Status   Viral Culture Comment  Final    Comment: (NOTE) Preliminary Report: No virus isolated at 24 hours. Next report to follow after 4 days. Performed At: [facility] [place], [place] [id number] Rush Farmer MD DD:[id number]    Source of Sample VAGINA  Final    Comment: Performed at [facility] [address] [place], [place] [address]  Culture, blood (routine x 2)     Status: None (Preliminary result)   Collection Time: 03/03/20 11:20 AM   Specimen: BLOOD  Result Value Ref Range Status   Specimen Description    Final    BLOOD RIGHT ANTECUBITAL Performed at [facility] [address] [place], [place] [address]    Special Requests   Final    BOTTLES  DRAWN AEROBIC AND ANAEROBIC Blood Culture adequate volume Performed at [facility] [address], [place], [place] [address]    Culture   Final    NO GROWTH 2 DAYS Performed at [facility] [address] [place], [place] [address]    Report Status PENDING  Incomplete  Culture, blood (routine x 2)     Status: None (Preliminary result)   Collection Time: 03/03/20 11:20 AM   Specimen: BLOOD  Result Value Ref Range Status   Specimen Description   Final    BLOOD PORTA CATH Performed at [facility] [address], [place], [place] [address]    Special Requests   Final    BOTTLES DRAWN AEROBIC AND ANAEROBIC Blood Culture adequate volume Performed at [facility] [address] [place], [place] [address]    Culture   Final    NO GROWTH 2 DAYS Performed at [facility] [address] [place], [place] [address]    Report Status PENDING  Incomplete  Urine culture     Status: Abnormal   Collection Time: 03/03/20 11:20 AM   Specimen: Urine, Clean Catch  Result Value Ref Range Status   Specimen Description   Final    URINE, CLEAN CATCH Performed at [facility], [place] [address], [place], [place] [address]    Special Requests   Final    NONE Performed at [facility], [place] [address] [place], [place] [address]    Culture (A)  Final    <10,000 COLONIES/mL INSIGNIFICANT GROWTH Performed at [facility] [address] [place], [place] [address]    Report Status 03/04/2020 FINAL  Final  SARS Coronavirus 2 by RT PCR (hospital order, performed in [facility] hospital lab) Nasopharyngeal Nasopharyngeal Swab     Status: None   Collection Time: 03/03/20 11:20 AM   Specimen: Nasopharyngeal Swab  Result Value  Ref Range Status   SARS Coronavirus 2 NEGATIVE NEGATIVE Final    Comment: (NOTE) SARS-CoV-2 target nucleic acids are NOT DETECTED.  The SARS-CoV-2 RNA is generally detectable in upper and lower respiratory specimens during the acute phase of infection. The lowest concentration of SARS-CoV-2 viral copies this assay can detect is 250 copies / mL. A negative result does not preclude SARS-CoV-2 infection and should not be used as the sole basis for treatment or other patient management decisions.  A negative result may occur with improper specimen collection / handling, submission of specimen other than nasopharyngeal swab, presence of viral mutation(s) within the areas targeted by this assay, and inadequate number of viral copies (<250 copies / mL). A negative result must be combined with clinical observations, patient history, and epidemiological information.  Fact Sheet for Patients:   StrictlyIdeas.no  Fact Sheet for Healthcare Providers: BankingDealers.co.za  This test is not yet approved or  cleared by the [place] FDA and has been authorized for detection and/or diagnosis of SARS-CoV-2 by FDA under an Emergency Use Authorization (EUA).  This EUA will remain in effect (meaning this test can be used) for the duration of the COVID-19 declaration under Section 564(b)(1) of the Act, 21 U.S.C. section 360bbb-3(b)(1), unless the authorization is terminated or revoked sooner.  Performed at [facility], [place] [address], [place],  [address]       Radiology Studies: No results found.   LOS: 2 days   Antonieta Pert, MD [facility]  03/05/2020, 1:54 PM

## 2020-03-05 NOTE — Telephone Encounter (Signed)
Scheduled appts per 7/6 los. Left voicemail with appt date and time.  

## 2020-03-05 NOTE — Progress Notes (Signed)
Physical Therapy Treatment Patient Details Name: [name] MRN: [id number] DOB: 07-22-1956 Today's Date: 03/05/2020    History of Present Illness 64 yo female admitted with skin breakdown/cellulitis, weakness, difficulty functioning at home. Hx of breast ca s/p mastectomy, lymphedema, poor wound healing, venous insufficiency, CKD, morbid obesity, falls    PT Comments    Utilized Sky lift equipment to transfer pt from bed to recliner. Pt was able to stand from recliner and take a few ambulatory steps in the room with use of a RW. She fatigues fairly easily. She is fearful of falling. She is still experiencing generalized pain with movement. Unsure of d/c plan-continue to recommend SNF at this time.    Follow Up Recommendations  SNF;Supervision/Assistance - 24 hour     Equipment Recommendations  None recommended by PT    Recommendations for Other Services       Precautions / Restrictions Precautions Precautions: Fall Restrictions Weight Bearing Restrictions: No    Mobility  Bed Mobility   Bed Mobility: Rolling Rolling: Min assist;+2 for physical assistance;+2 for safety/equipment         General bed mobility comments: Rolling for lift pad placement. Increased time. Pt able to help with use of bedrails.  Transfers Overall transfer level: Needs assistance Equipment used: Rolling walker (2 wheeled) Transfers: Sit to/from Stand Sit to Stand: Min assist;+2 physical assistance;+2 safety/equipment         General transfer comment: Increased time. Cues for safety, hand placement.  Ambulation/Gait Ambulation/Gait assistance: Min assist;+2 physical assistance;+2 safety/equipment Gait Distance (Feet): 5 Feet Assistive device: Rolling walker (2 wheeled) Gait Pattern/deviations: Decreased step length - right;Decreased step length - left     General Gait Details: Very short steps. Pt took 5 steps forwards then 5 steps backwards with RW. Increased time. Fatigues fairly  easily.   Stairs             Wheelchair Mobility    Modified Rankin (Stroke Patients Only)       Balance Overall balance assessment: Needs assistance         Standing balance support: Bilateral upper extremity supported Standing balance-Leahy Scale: Poor                              Cognition Arousal/Alertness: Awake/alert Behavior During Therapy: WFL for tasks assessed/performed Overall Cognitive Status: Within Functional Limits for tasks assessed                                        Exercises      General Comments        Pertinent Vitals/Pain Pain Assessment: Faces Faces Pain Scale: Hurts whole lot Pain Location: "everywhere" Pain Descriptors / Indicators: Discomfort;Sore;Grimacing;Moaning Pain Intervention(s): Limited activity within patient's tolerance;Monitored during session;Repositioned    Home Living                      Prior Function            PT Goals (current goals can now be found in the care plan section) Progress towards PT goals: Progressing toward goals    Frequency    Min 3X/week (unsure if plan is for SNF or home)      PT Plan Current plan remains appropriate    Co-evaluation  AM-PAC PT "6 Clicks" Mobility   Outcome Measure  Help needed turning from your back to your side while in a flat bed without using bedrails?: A Lot Help needed moving from lying on your back to sitting on the side of a flat bed without using bedrails?: A Lot Help needed moving to and from a bed to a chair (including a wheelchair)?: A Lot Help needed standing up from a chair using your arms (e.g., wheelchair or bedside chair)?: A Lot Help needed to walk in hospital room?: A Lot Help needed climbing 3-5 steps with a railing? : Total 6 Click Score: 11    End of Session Equipment Utilized During Treatment: Gait belt Activity Tolerance: Patient limited by fatigue;Patient limited by  pain Patient left: in chair;with call bell/phone within reach   PT Visit Diagnosis: Muscle weakness (generalized) (M62.81);Pain;Other abnormalities of gait and mobility (R26.89)     Time: 6468-0321 PT Time Calculation (min) (ACUTE ONLY): 24 min  Charges:  $Gait Training: 8-22 mins $Therapeutic Activity: 8-22 mins                        Doreatha Massed, PT Acute Rehabilitation  Office: [phone] Pager: [phone]

## 2020-03-05 NOTE — Evaluation (Addendum)
Occupational Therapy Evaluation Patient Details Name: [name] MRN: [id number] DOB: 11-07-1955 Today's Date: 03/05/2020    History of Present Illness 64 yo female admitted with skin breakdown/cellulitis, weakness, difficulty functioning at home. Hx of breast ca s/p mastectomy, lymphedema, poor wound healing, venous insufficiency, CKD, morbid obesity, falls   Clinical Impression   Patient with functional deficits listed below impacting safety and independence with self care. At baseline patient reports niece that lives ~5 mins away comes over daily to assist with dressing, sponge bathing, peri care after bowel movement. Patient reports she uses walker to ambulate in her home, also has w/c available. Currently patient min A x2 for safety with sit to stand from recliner x3 trials. Patient stating imminent need for bathroom, however bariatric commode not readily available, order placed for one. Patient stood min x2 for placement of bed pan then returned to recliner. Patient reports just gas, no bowel movement at this time. Patient able to ambulate few steps with bari walker before to fatigued and needing to sit. Patient require cues for safe hand placement during functional transfers. Will continue to follow.  Communicated with wound RN regarding use of inter dry due to skin break down under patient's panus, wound RN report will place order for this. Relayed this to patient's floor RN.     Follow Up Recommendations  SNF;Supervision/Assistance - 24 hour    Equipment Recommendations  Other (comment) (TBD)       Precautions / Restrictions Precautions Precautions: Fall Precaution Comments: skin breakdown 2* body habitus Restrictions Weight Bearing Restrictions: No Other Position/Activity Restrictions: BMI 60 height 5'1"      Mobility Bed Mobility             General bed mobility comments: oob upon arrival  Transfers Overall transfer level: Needs assistance Equipment used:  Rolling walker (2 wheeled) Transfers: Sit to/from Stand Sit to Stand: Min assist;+2 safety/equipment         General transfer comment: Increased time. Cues for safety, hand placement.    Balance Overall balance assessment: Needs assistance Sitting-balance support: Bilateral upper extremity supported Sitting balance-Leahy Scale: Fair Sitting balance - Comments: at edge of chair   Standing balance support: Bilateral upper extremity supported Standing balance-Leahy Scale: Poor Standing balance comment: reliant on external support                           ADL either performed or assessed with clinical judgement   ADL Overall ADL's : Needs assistance/impaired Eating/Feeding: Set up;Sitting   Grooming: Set up;Sitting   Upper Body Bathing: Minimal assistance;Sitting   Lower Body Bathing: Maximal assistance;Sitting/lateral leans;Sit to/from stand   Upper Body Dressing : Minimal assistance;Sitting;Moderate assistance   Lower Body Dressing: Maximal assistance;Total assistance;Sitting/lateral leans;Sit to/from stand   Toilet Transfer: Minimal assistance;+2 for safety/equipment;Ambulation;RW;BSC Toilet Transfer Details (indicate cue type and reason): simulated with functional mobility + sit/stand from recliner, min A for safety and steadying assist upon standing. patient able to take few steps forward; fatigues quickly Toileting- Clothing Manipulation and Hygiene: Total assistance       Functional mobility during ADLs: Minimal assistance;+2 for safety/equipment;Rolling walker;Cueing for safety General ADL Comments: at baseline patient requires assistance from niece for bathing, dressing, peri care. patient does exhibit limited activity tolerance and safety awareness requiring cues for safely sitting onto recliner                  Pertinent Vitals/Pain Pain Assessment: Faces Faces Pain Scale:  Hurts even more Pain Location: R arm with transfer Pain Descriptors /  Indicators: Moaning;Grimacing Pain Intervention(s): Repositioned     Hand Dominance  (did not specify)   Extremity/Trunk Assessment Upper Extremity Assessment Upper Extremity Assessment: Overall WFL for tasks assessed   Lower Extremity Assessment Lower Extremity Assessment: Defer to PT evaluation   Cervical / Trunk Assessment Cervical / Trunk Assessment: Other exceptions Cervical / Trunk Exceptions: body habitus   Communication Communication Communication: Expressive difficulties;Other (comment) (mumbled speech at times)   Cognition Arousal/Alertness: Awake/alert;Lethargic Behavior During Therapy: Anxious Overall Cognitive Status: No family/caregiver present to determine baseline cognitive functioning                                 General Comments: patient falling asleep at times during questioning, patient oriented to place not time (states its Feburary) or situation (I'm here for an EKG) unsure of patient baseline   General Comments  note skin breakdown under panus, spoke with wound care nurse regarding use of inter dry, wound RN report she would put in order            Home Living Family/patient expects to be discharged to:: Unsure Living Arrangements: Other relatives Available Help at Discharge: Family Type of Home: Apartment Home Access: Level entry     Home Layout: One level     Bathroom Shower/Tub: Occupational psychologist: Handicapped height     Home Equipment: Grab bars - toilet;Grab bars - tub/shower;Wheelchair - [employer] - 2 wheels;Cane - single point;Shower seat   Additional Comments: info obtained from prior eval, patient inconsistent with providing history. States niece lives ~5 mins away and she just calls her when she needs assist with ADLs      Prior Functioning/Environment Level of Independence: Needs assistance  Gait / Transfers Assistance Needed: reports ambulating with rolling walker at home ADL's / Homemaking  Assistance Needed: niece helps with ADLs daily, pt reports can fix herself meals, does not take home medications per patient            OT Problem List: Decreased activity tolerance;Impaired balance (sitting and/or standing);Decreased safety awareness;Decreased cognition;Pain;Obesity      OT Treatment/Interventions: Self-care/ADL training;Therapeutic exercise;DME and/or AE instruction;Therapeutic activities;Cognitive remediation/compensation;Patient/family education;Balance training    OT Goals(Current goals can be found in the care plan section) Acute Rehab OT Goals Patient Stated Goal: "I need to use the bathroom, now" OT Goal Formulation: With patient Time For Goal Achievement: 03/19/20 Potential to Achieve Goals: Good  OT Frequency: Min 2X/week    AM-PAC OT "6 Clicks" Daily Activity     Outcome Measure Help from another person eating meals?: A Little Help from another person taking care of personal grooming?: A Little Help from another person toileting, which includes using toliet, bedpan, or urinal?: A Lot Help from another person bathing (including washing, rinsing, drying)?: A Lot Help from another person to put on and taking off regular upper body clothing?: A Little Help from another person to put on and taking off regular lower body clothing?: A Lot 6 Click Score: 15   End of Session Equipment Utilized During Treatment: Gait belt;Rolling walker Nurse Communication: Mobility status;Other (comment) (informed RN that wound RN ordering inter dry)  Activity Tolerance: Patient limited by fatigue Patient left: in chair;with call bell/phone within reach  OT Visit Diagnosis: Unsteadiness on feet (R26.81);Other abnormalities of gait and mobility (R26.89);Pain Pain - part of body:  (global)  Time: 1220-1250 OT Time Calculation (min): 30 min Charges:  OT General Charges $OT Visit: 1 Visit OT Evaluation $OT Eval Moderate Complexity: 1 Mod OT Treatments $Self  Care/Home Management : 8-22 mins  Delbert Phenix OT Pager: [place] 03/05/2020, 1:28 PM

## 2020-03-05 NOTE — Consult Note (Addendum)
OT team requested [place] assistance for use of Interdry; discussed patient via Secure chat.  Skin folds to abd are red, macerated, and moist. Appearance is consistent with intertrigo.  Interdry silver-impregnated fabric ordered for use by bedside nurses and instructions provided.  This product should remain in place for 5 days for optimal plan of care to provide antimicrobial benefits and wick moisture away from skin.  Please re-consult if further assistance is needed.  Thank-you,  Julien Girt MSN, [place], [place], [place], [place]

## 2020-03-06 ENCOUNTER — Inpatient Hospital Stay: Payer: Medicaid Other | Attending: Oncology

## 2020-03-06 DIAGNOSIS — C50812 Malignant neoplasm of overlapping sites of left female breast: Secondary | ICD-10-CM | POA: Insufficient documentation

## 2020-03-06 DIAGNOSIS — Z170 Estrogen receptor positive status [ER+]: Secondary | ICD-10-CM | POA: Insufficient documentation

## 2020-03-06 DIAGNOSIS — Z823 Family history of stroke: Secondary | ICD-10-CM | POA: Insufficient documentation

## 2020-03-06 DIAGNOSIS — N179 Acute kidney failure, unspecified: Secondary | ICD-10-CM

## 2020-03-06 DIAGNOSIS — T451X5A Adverse effect of antineoplastic and immunosuppressive drugs, initial encounter: Secondary | ICD-10-CM | POA: Diagnosis not present

## 2020-03-06 DIAGNOSIS — Z79899 Other long term (current) drug therapy: Secondary | ICD-10-CM | POA: Insufficient documentation

## 2020-03-06 DIAGNOSIS — R197 Diarrhea, unspecified: Secondary | ICD-10-CM | POA: Insufficient documentation

## 2020-03-06 DIAGNOSIS — Z8041 Family history of malignant neoplasm of ovary: Secondary | ICD-10-CM | POA: Insufficient documentation

## 2020-03-06 DIAGNOSIS — K760 Fatty (change of) liver, not elsewhere classified: Secondary | ICD-10-CM | POA: Insufficient documentation

## 2020-03-06 DIAGNOSIS — Z8042 Family history of malignant neoplasm of prostate: Secondary | ICD-10-CM | POA: Insufficient documentation

## 2020-03-06 DIAGNOSIS — I700 Atherosclerosis of aorta: Secondary | ICD-10-CM | POA: Insufficient documentation

## 2020-03-06 DIAGNOSIS — R1084 Generalized abdominal pain: Secondary | ICD-10-CM | POA: Insufficient documentation

## 2020-03-06 DIAGNOSIS — Z888 Allergy status to other drugs, medicaments and biological substances status: Secondary | ICD-10-CM | POA: Insufficient documentation

## 2020-03-06 DIAGNOSIS — D6481 Anemia due to antineoplastic chemotherapy: Secondary | ICD-10-CM | POA: Diagnosis not present

## 2020-03-06 DIAGNOSIS — Z79811 Long term (current) use of aromatase inhibitors: Secondary | ICD-10-CM | POA: Insufficient documentation

## 2020-03-06 DIAGNOSIS — D72829 Elevated white blood cell count, unspecified: Secondary | ICD-10-CM | POA: Diagnosis not present

## 2020-03-06 DIAGNOSIS — Z8249 Family history of ischemic heart disease and other diseases of the circulatory system: Secondary | ICD-10-CM | POA: Insufficient documentation

## 2020-03-06 DIAGNOSIS — I10 Essential (primary) hypertension: Secondary | ICD-10-CM | POA: Insufficient documentation

## 2020-03-06 DIAGNOSIS — Z833 Family history of diabetes mellitus: Secondary | ICD-10-CM | POA: Insufficient documentation

## 2020-03-06 DIAGNOSIS — Z818 Family history of other mental and behavioral disorders: Secondary | ICD-10-CM | POA: Insufficient documentation

## 2020-03-06 DIAGNOSIS — R238 Other skin changes: Secondary | ICD-10-CM | POA: Diagnosis not present

## 2020-03-06 DIAGNOSIS — N200 Calculus of kidney: Secondary | ICD-10-CM | POA: Insufficient documentation

## 2020-03-06 LAB — COMPREHENSIVE METABOLIC PANEL
ALT: 8 U/L (ref 0–44)
AST: 10 U/L — ABNORMAL LOW (ref 15–41)
Albumin: 2.1 g/dL — ABNORMAL LOW (ref 3.5–5.0)
Alkaline Phosphatase: 65 U/L (ref 38–126)
Anion gap: 8 (ref 5–15)
BUN: 24 mg/dL — ABNORMAL HIGH (ref 8–23)
CO2: 24 mmol/L (ref 22–32)
Calcium: 8.5 mg/dL — ABNORMAL LOW (ref 8.9–10.3)
Chloride: 107 mmol/L (ref 98–111)
Creatinine, Ser: 1.24 mg/dL — ABNORMAL HIGH (ref 0.44–1.00)
GFR calc Af Amer: 53 mL/min — ABNORMAL LOW (ref >60)
GFR calc non Af Amer: 46 mL/min — ABNORMAL LOW (ref >60)
Glucose, Bld: 108 mg/dL — ABNORMAL HIGH (ref 70–99)
Potassium: 4 mmol/L (ref 3.5–5.1)
Sodium: 139 mmol/L (ref 135–145)
Total Bilirubin: 0.5 mg/dL (ref 0.3–1.2)
Total Protein: 5.1 g/dL — ABNORMAL LOW (ref 6.5–8.1)

## 2020-03-06 LAB — MAGNESIUM: Magnesium: 1.6 mg/dL — ABNORMAL LOW (ref 1.7–2.4)

## 2020-03-06 LAB — HEMOGLOBIN AND HEMATOCRIT, BLOOD
HCT: 25.3 % — ABNORMAL LOW (ref 36.0–46.0)
Hemoglobin: 8.1 g/dL — ABNORMAL LOW (ref 12.0–15.0)

## 2020-03-06 LAB — CBC
HCT: 21.6 % — ABNORMAL LOW (ref 36.0–46.0)
Hemoglobin: 6.9 g/dL — CL (ref 12.0–15.0)
MCH: 30.8 pg (ref 26.0–34.0)
MCHC: 31.9 g/dL (ref 30.0–36.0)
MCV: 96.4 fL (ref 80.0–100.0)
Platelets: 180 10*3/uL (ref 150–400)
RBC: 2.24 MIL/uL — ABNORMAL LOW (ref 3.87–5.11)
RDW: 15.7 % — ABNORMAL HIGH (ref 11.5–15.5)
WBC: 22 10*3/uL — ABNORMAL HIGH (ref 4.0–10.5)
nRBC: 0.1 % (ref 0.0–0.2)

## 2020-03-06 LAB — PREPARE RBC (CROSSMATCH)

## 2020-03-06 MED ORDER — OXYCODONE-ACETAMINOPHEN 5-325 MG PO TABS
1.0000 | ORAL_TABLET | Freq: Four times a day (QID) | ORAL | Status: DC | PRN
  Administered 2020-03-06 – 2020-03-21 (×15): 1 via ORAL
  Filled 2020-03-06 (×15): qty 1

## 2020-03-06 MED ORDER — HYDROXYZINE HCL 10 MG PO TABS
10.0000 mg | ORAL_TABLET | Freq: Three times a day (TID) | ORAL | Status: DC | PRN
  Administered 2020-03-06: 10 mg via ORAL
  Filled 2020-03-06 (×2): qty 1

## 2020-03-06 MED ORDER — MAGNESIUM OXIDE 400 (241.3 MG) MG PO TABS
400.0000 mg | ORAL_TABLET | Freq: Two times a day (BID) | ORAL | Status: AC
  Administered 2020-03-06 – 2020-03-12 (×14): 400 mg via ORAL
  Filled 2020-03-06 (×14): qty 1

## 2020-03-06 MED ORDER — FLUCONAZOLE 100 MG PO TABS
100.0000 mg | ORAL_TABLET | Freq: Every day | ORAL | Status: AC
  Administered 2020-03-06 – 2020-03-10 (×5): 100 mg via ORAL
  Filled 2020-03-06 (×5): qty 1

## 2020-03-06 MED ORDER — SODIUM CHLORIDE 0.9% IV SOLUTION
Freq: Once | INTRAVENOUS | Status: AC
  Administered 2020-03-06: 1 mL via INTRAVENOUS

## 2020-03-06 NOTE — TOC Initial Note (Signed)
Transition of Care [facility] - Initial/Assessment Note    Patient Details  Name: [name] MRN: [id number] Date of Birth: 1955/12/01  [facility] CM/SW Contact:    Lynnell Catalan, RN Phone Number: 03/06/2020, 10:06 AM  Clinical Narrative:                 This CM spoke with pt at bedside about physical therapy recommendations. Pt declines SNF placement and says that her sister and niece help her with what she needs at home. This CM expressed concern about her desire to go back home with limited assistance. Pt has a hospital bed, wheelchair, walker and shower seat. TOC will continue to follow.  Expected Discharge Plan: [place] Barriers to Discharge: Continued Medical Work up   Patient Goals and CMS Choice Patient states their goals for this hospitalization and ongoing recovery are:: to get home      Expected Discharge Plan and Services Expected Discharge Plan: [place]   Discharge Planning Services: CM Consult   Living arrangements for the past 2 months: Single Family Home                                      Prior Living Arrangements/Services Living arrangements for the past 2 months: Single Family Home Lives with:: Self Patient language and need for interpreter reviewed:: Yes        Need for Family Participation in Patient Care: Yes (Comment) Care giver support system in place?: Yes (comment)   Criminal Activity/Legal Involvement Pertinent to Current Situation/Hospitalization: No - Comment as needed  Activities of Daily Living Home Assistive Devices/Equipment: Eyeglasses, Environmental consultant (specify type), Wheelchair ADL Screening (condition at time of admission) Patient's cognitive ability adequate to safely complete daily activities?: Yes Is the patient deaf or have difficulty hearing?: No Does the patient have difficulty seeing, even when wearing glasses/contacts?: No Does the patient have difficulty concentrating,  remembering, or making decisions?: No Patient able to express need for assistance with ADLs?: Yes Does the patient have difficulty dressing or bathing?: Yes Independently performs ADLs?: No Communication: Independent Dressing (OT): Needs assistance Is this a change from baseline?: Pre-admission baseline Grooming: Independent Feeding: Independent Bathing: Needs assistance Is this a change from baseline?: Pre-admission baseline Toileting: Needs assistance Is this a change from baseline?: Pre-admission baseline In/Out Bed: Needs assistance Is this a change from baseline?: Pre-admission baseline Walks in Home: Needs assistance Is this a change from baseline?: Pre-admission baseline Does the patient have difficulty walking or climbing stairs?: Yes Weakness of Legs: Both Weakness of Arms/Hands: Both  Permission Sought/Granted                  Emotional Assessment Appearance:: Appears older than stated age Attitude/Demeanor/Rapport: Avoidant Affect (typically observed): Stable Orientation: : Oriented to Self, Oriented to Place, Oriented to  Time, Oriented to Situation      Admission diagnosis:  Skin breakdown [R23.8] Skin ulcer of perineum, limited to breakdown of skin [facility]) [L98.491] Patient Active Problem List   Diagnosis Date Noted  . Gout 03/03/2020  . Skin breakdown 03/03/2020  . Skin ulcer of perineum, limited to breakdown of skin ([place]) 03/03/2020  . Leukocytosis 03/03/2020  . Anemia associated with chemotherapy 03/03/2020  . Thrombocytopenia ([place]) 03/03/2020  . Port-A-Cath in place 02/19/2020  . Acute renal failure (ARF) ([place]) 01/22/2020  . S/P left mastectomy 12/19/2019  .  Genetic testing 11/30/2019  . Morbid obesity with BMI of 60.0-69.9, adult ([place]) 11/21/2019  . Family history of ovarian cancer   . Malignant neoplasm of upper-outer quadrant of left breast in female, estrogen receptor positive ([place]) 11/14/2019  . Fracture of radial head, right, closed  06/25/2019  . Frequent falls 01/10/2019  . Bilateral bunions 01/10/2019  . Toenail fungus 01/10/2019  . Alcoholic hepatitis 68/34/1962  . Edema 11/20/2018  . Alcohol-induced mood disorder ([place]) 11/01/2013  . Alcohol dependence ([place]) 11/01/2013  . Essential hypertension 06/20/2007  . LOW BACK PAIN 06/20/2007  . DIVERTICULOSIS, COLON 05/05/2007   PCP:  [name], MD Pharmacy:   [id number] - [place], [place] [address] [address] Phone: [phone] Fax: [phone]     Social Determinants of Health ([place]) Interventions    Readmission Risk Interventions Readmission Risk Prevention Plan 03/06/2020 02/13/2020  Transportation Screening Complete Complete  PCP or Specialist Appt within 3-5 Days Not Complete -  Not Complete comments Unsure of dc date at this time -  [place] or [place] Complete -  Social Work Consult for [place] Planning/Counseling Complete -  Palliative Care Screening Not Applicable -  Medication Review Press photographer) Complete Complete  PCP or Specialist appointment within 3-5 days of discharge - Complete  [place] or [place] - Complete  SW Recovery Care/Counseling Consult - Complete  [place] - Not Applicable  Some recent data might be hidden

## 2020-03-06 NOTE — Progress Notes (Signed)
Dr. Maren Beach notified of patients hemoglobin 6.9. Awaiting further orders.

## 2020-03-06 NOTE — Progress Notes (Signed)
PROGRESS NOTE    [name]  ZOX:[id number] DOB: [birth year] DOA: 03/03/2020 PCP: [name], MD   Chef Complaints: Skin breakdown  Brief Narrative: 64 YO female with breast cancer status postmastectomy in April 2021 with negative margins undergoing adjuvant chemo Adriamycin plus cyclophosphamide last dose last week prior to admission, morbidly obese BMI 60, poorly mobile, lymphedema/venous insufficiency/CKD, hypertension recent admission for the inner thighs and perineum, who lives alone and a family member comes in to take care of her, has been incontinent with urine at all times noted to have a significant skin breakdown in the shower, sent to the ED for evaluation  In the ED found to have a lot of skin breakdown maceration several vesicles and also significant leukocytosis anemia, thrombocytopenia elevated BUN/creatinine of 1.6 up from 1.3 on June/30.  Foley catheter was placed, patient was admitted for IV antibiotics IV fluids hydration  Subjective: This morning complains of ongoing itching. And IV pain medications. Some oral thrush. Hemoglobin 6.9 this morning, WBC to have but not rising anymore, afebrile.  Feels weak this morning.    Assessment & Plan:  Multiple skin breakdown cellulitis in the inner thighs and perineum abdominal pannus area with wound in different stages: Multifactorial with poor mobility/morbid obesity, incontinence of urine limited care at home.  Initial lactic acidosis resolved.  Has leukocytosis-likely contributed by Neulasta as well.  No fever.  Continue wound care, IV Ancef Valtrex, Foley catheter to keep the area dry.  Virus culture sent from the skin scraping.  MRSA screening Neg 3 weeks ago.  Starting oral opiates when IV opiates.  Leukocytosis: Suspect mostly from Neulasta that she received on 6/24, discussed with her oncologist.  Also has infection as above and continue on antibiotics.Urine culture insignificant growth.  Blood culture negative so far.     Oral thrush start Diflucan.  Essential hypertension: BP controlled on Coreg and hydralazine.  AKI: Creatinine 0.8 at baseline peaked at 1.6 improving, at 1.2.  Monitor closely.   Metabolic acidosis: With AKI, bicarb improved to 24.   Folate deficiency/anemia of chronic disease hemoglobin 8.5 on /3-: On admission 7.9 g and has decreased to 6.9, suspect multifactorial in the setting of cancer, folate deficiency.  Discussed risks benefits alternative of blood transfusion and she would like to proceed with 1 unit of PRBC transfusion. Anemia panel shows elevated ferritin at 61, Low folate 3.8, B12-2310.  Continue on folate supplementation.  Monitor H&H posttransfusion  Deconditioning/poor mobility/malaise weakness: Encourage ambulation continue PT OT, will need skilled nursing facility per PT OT.  T2 N1 stage IIb grade 3 ER positive breast cancer followed by medical oncology chemotherapy session 7/6 has been posted for 7/13 as patient did not go to outpatient follow-up instead came to the ED.  I have notified her oncology today.    Gout continue colchicine.  Thrombocytopenia likely from chemo.  Monitor platelet count.  Hypomagnesemia: At 1.6, add magnesium oxide x 7 days.   Super morbid obesity with BMI 60.  Will benefit weight loss at the lifestyle  Pressure ulcer present on admission on buttock as below: We will continue wound care 03/03/20 2308  Location: Buttocks  Location Orientation: Posterior  Staging: Stage 3 -  Full thickness tissue loss. Subcutaneous fat may be visible but bone, tendon or muscle are NOT exposed.  Wound Description (Comments):   Present on Admission: Yes     Pressure Injury 03/03/20 Buttocks Stage 3 -  Full thickness tissue loss. Subcutaneous fat may be visible but bone, tendon  or muscle are NOT exposed. (Active)  03/03/20 2308  Location: Buttocks  Location Orientation:   Staging: Stage 3 -  Full thickness tissue loss. Subcutaneous fat may be visible but  bone, tendon or muscle are NOT exposed.  Wound Description (Comments):   Present on Admission: Yes   DVT prophylaxis: lovenox  Code Status: FULL Family Communication: plan of care discussed with patient at bedside.  Status is: Inpatient  Remains inpatient appropriate because:Unsafe d/c plan, IV treatments appropriate due to intensity of illness or inability to take PO and Inpatient level of care appropriate due to severity of illness   Dispo: The patient is from: Home              Anticipated d/c is to: SNF but patient is reluctant wants to go home.              Anticipated d/c date is: 2 days              Patient currently is not medically stable to d/c.  Nutrition: Diet Order            DIET SOFT Room service appropriate? Yes; Fluid consistency: Thin  Diet effective now                       Body mass index is 60.46 kg/m.   Consultants:see note  Procedures:see note Microbiology:see note  Medications: Scheduled Meds:  aspirin EC  81 mg Oral Daily   carvedilol  12.5 mg Oral BID WC   Chlorhexidine Gluconate Cloth  6 each Topical Daily   colchicine  0.3 mg Oral QODAY   enoxaparin (LOVENOX) injection  60 mg Subcutaneous C00L   folic acid  1 mg Oral Daily   hydrALAZINE  25 mg Oral Q8H   magnesium oxide  400 mg Oral BID   valACYclovir  1,000 mg Oral BID   Continuous Infusions:  sodium chloride 1,000 mL (03/05/20 0644)    ceFAZolin (ANCEF) IV 1 g (03/06/20 0610)    Antimicrobials: Anti-infectives (From admission, onward)   Start     Dose/Rate Route Frequency Ordered Stop   03/03/20 1400  ceFAZolin (ANCEF) IVPB 1 g/50 mL premix     Discontinue     1 g 100 mL/hr over 30 Minutes Intravenous Every 8 hours 03/03/20 1237     03/03/20 1115  valACYclovir (VALTREX) tablet 1,000 mg     Discontinue     1,000 mg Oral 2 times daily 03/03/20 1106         Objective: Vitals: Today's Vitals   03/06/20 0635 03/06/20 0810 03/06/20 1150 03/06/20 1216  BP:   (!)  113/92 (!) 120/58  Pulse:   87 86  Resp:   18 16  Temp:   98.2 F (36.8 C) 98.2 F (36.8 C)  TempSrc:   Oral Oral  SpO2:   97% 97%  Weight:      Height:      PainSc: Asleep 9       Intake/Output Summary (Last 24 hours) at 03/06/2020 1405 Last data filed at 03/06/2020 0615 Gross per 24 hour  Intake 492.01 ml  Output 635 ml  Net -142.99 ml   Filed Weights   03/03/20 0942  Weight: (!) 145.2 kg   Weight change:    Intake/Output from previous day: 07/07 0701 - 07/08 0700 In: 492 [I.V.:492] Out: 635 [Urine:635] Intake/Output this shift: No intake/output data recorded.  Examination:  General exam: AAO , morbidly obese,  not in distress. HEENT:Oral mucosa moist, Ear/Nose WNL grossly, dentition normal. Respiratory system: bilaterally near, no wheezing or crackles,no use of accessory muscle Cardiovascular system: S1 & S2 +, No JVD,. Gastrointestinal system: Abdomen soft, obese with skin fold underneath skin breakdown, and serous drainage mild. Nervous System:Alert, awake, moving extremities and grossly nonfocal Extremities: Extensive lymphedema and lower leg skin breakdown in the inner thigh  Skin: No rashes,no icterus. MSK: Normal muscle bulk,tone, power Foley In place.  Data Reviewed: I have personally reviewed following labs and imaging studies CBC: Recent Labs  Lab 03/03/20 1021 03/04/20 1110 03/05/20 0500 03/06/20 0543  WBC 17.2* 17.6* 23.1* 22.0*  NEUTROABS 13.7* 14.1* 18.2*  --   HGB 7.9* 7.3* 7.3* 6.9*  HCT 23.9* 22.5* 22.3* 21.6*  MCV 93.0 94.9 95.3 96.4  PLT 127* 161 164 762   Basic Metabolic Panel: Recent Labs  Lab 03/03/20 1021 03/04/20 1110 03/05/20 0500 03/06/20 0543  NA 135 136 137 139  K 3.9 3.9 3.8 4.0  CL 101 102 105 107  CO2 21* 22 24 24   GLUCOSE 142* 120* 136* 108*  BUN 32* 30* 29* 24*  CREATININE 1.61* 1.51* 1.50* 1.24*  CALCIUM 8.2* 7.9* 8.4* 8.5*  MG  --  1.3* 1.9 1.6*  PHOS  --  2.7 3.0  --    GFR: Estimated Creatinine  Clearance: 62.8 mL/min (A) (by C-G formula based on SCr of 1.24 mg/dL (H)). Liver Function Tests: Recent Labs  Lab 03/03/20 1021 03/04/20 1110 03/05/20 0500 03/06/20 0543  AST 15 11* 14* 10*  ALT 15 13 11 8   ALKPHOS 52 54 67 65  BILITOT 0.3 0.6 0.6 0.5  PROT 5.8* 5.3* 5.5* 5.1*  ALBUMIN 2.5* 2.3* 2.4* 2.1*   No results for input(s): LIPASE, AMYLASE in the last 168 hours. No results for input(s): AMMONIA in the last 168 hours. Coagulation Profile: No results for input(s): INR, PROTIME in the last 168 hours. Cardiac Enzymes: No results for input(s): CKTOTAL, CKMB, CKMBINDEX, TROPONINI in the last 168 hours. BNP (last 3 results) No results for input(s): PROBNP in the last 8760 hours. HbA1C: No results for input(s): HGBA1C in the last 72 hours. CBG: No results for input(s): GLUCAP in the last 168 hours. Lipid Profile: No results for input(s): CHOL, HDL, LDLCALC, TRIG, CHOLHDL, LDLDIRECT in the last 72 hours. Thyroid Function Tests: No results for input(s): TSH, T4TOTAL, FREET4, T3FREE, THYROIDAB in the last 72 hours. Anemia Panel: Recent Labs    03/05/20 0500  VITAMINB12 2,310*  FOLATE 3.8*  FERRITIN 861*  TIBC 157*  IRON 52  RETICCTPCT 0.8   Sepsis Labs: Recent Labs  Lab 03/03/20 1120 03/03/20 1313 03/04/20 1111  LATICACIDVEN 2.7* 2.1* 1.0    Recent Results (from the past 240 hour(s))  Virus culture     Status: None   Collection Time: 03/03/20 10:21 AM   Specimen: Skin Scraping  Result Value Ref Range Status   Viral Culture Comment  Final    Comment: (NOTE) Preliminary Report: No virus isolated at 24 hours. Next report to follow after 4 days. Performed At: [facility] [place], [place] [id number] Rush Farmer MD WV:[id number]    Source of Sample VAGINA  Final    Comment: Performed at [name] [address] [place], [place] [address]  Culture, blood (routine x 2)     Status: None (Preliminary result)    Collection Time: 03/03/20 11:20 AM   Specimen: BLOOD  Result Value Ref Range Status  Specimen Description   Final    BLOOD RIGHT ANTECUBITAL Performed at [facility] [address] [place], [place] [address]    Special Requests   Final    BOTTLES DRAWN AEROBIC AND ANAEROBIC Blood Culture adequate volume Performed at [facility] [address], [place], [place] [address]    Culture   Final    NO GROWTH 3 DAYS Performed at [facility], [place] [address] [place], [place] [address]    Report Status PENDING  Incomplete  Culture, blood (routine x 2)     Status: None (Preliminary result)   Collection Time: 03/03/20 11:20 AM   Specimen: BLOOD  Result Value Ref Range Status   Specimen Description   Final    BLOOD PORTA CATH Performed at [place] [facility] [address], [place], [place] [address]    Special Requests   Final    BOTTLES DRAWN AEROBIC AND ANAEROBIC Blood Culture adequate volume Performed at [place] [address], [place], [place] [address]    Culture   Final    NO GROWTH 3 DAYS Performed at [facility], [place] [address] [place], [place] [address]    Report Status PENDING  Incomplete  Urine culture     Status: Abnormal   Collection Time: 03/03/20 11:20 AM   Specimen: Urine, Clean Catch  Result Value Ref Range Status   Specimen Description   Final    URINE, CLEAN CATCH Performed at [facility], [place] [address] [place], [place] [address]    Special Requests   Final    NONE Performed at [facility], [place] [address] [place], [place] [address]    Culture (A)  Final    <10,000 COLONIES/mL INSIGNIFICANT GROWTH Performed at [place] [address], [place], [place] [address]    Report Status 03/04/2020 FINAL  Final  SARS Coronavirus 2 by RT PCR (hospital order, performed in [facility] hospital lab) Nasopharyngeal  Nasopharyngeal Swab     Status: None   Collection Time: 03/03/20 11:20 AM   Specimen: Nasopharyngeal Swab  Result Value Ref Range Status   SARS Coronavirus 2 NEGATIVE NEGATIVE Final    Comment: (NOTE) SARS-CoV-2 target nucleic acids are NOT DETECTED.  The SARS-CoV-2 RNA is generally detectable in upper and lower respiratory specimens during the acute phase of infection. The lowest concentration of SARS-CoV-2 viral copies this assay can detect is 250 copies / mL. A negative result does not preclude SARS-CoV-2 infection and should not be used as the sole basis for treatment or other patient management decisions.  A negative result may occur with improper specimen collection / handling, submission of specimen other than nasopharyngeal swab, presence of viral mutation(s) within the areas targeted by this assay, and inadequate number of viral copies (<250 copies / mL). A negative result must be combined with clinical observations, patient history, and epidemiological information.  Fact Sheet for Patients:   StrictlyIdeas.no  Fact Sheet for Healthcare Providers: BankingDealers.co.za  This test is not yet approved or  cleared by the [place] FDA and has been authorized for detection and/or diagnosis of SARS-CoV-2 by FDA under an Emergency Use Authorization (EUA).  This EUA will remain in effect (meaning this test can be used) for the duration of the COVID-19 declaration under Section 564(b)(1) of the Act, 21 U.S.C. section 360bbb-3(b)(1), unless the authorization is terminated or revoked sooner.  Performed at [facility], [place] [address] [place], [place] [address]       Radiology Studies: No results found.   LOS: 3 days   Antonieta Pert, MD [facility]  03/06/2020, 2:05 PM

## 2020-03-06 NOTE — Progress Notes (Signed)
[name]   DOB:[birth year]   GB#:[id number]   EYE#:[id number]  Subjective:  [name] is now admitted for skin breakdown and maceration.  A Foley catheter is in place to keep the area dry.  She is on IV antibiotics and IV fluids. The patient was due for cycle #3 of chemotherapy earlier this week on 03/04/2020.  Not given due to hospitalization.  Seen this morning, she reports pain on her inner thighs.  Afebrile.   Objective: African American woman exmained in bed Vitals:   03/05/20 2149 03/06/20 0615  BP: 115/62 118/65  Pulse: 86 92  Resp: 19 18  Temp: 97.9 F (36.6 C) 98.3 F (36.8 C)  SpO2: 99% 96%    Body mass index is 60.46 kg/m.  Intake/Output Summary (Last 24 hours) at 03/06/2020 1047 Last data filed at 03/06/2020 0615 Gross per 24 hour  Intake 492.01 ml  Output 635 ml  Net -142.99 ml   General: Alert and oriented, morbidly obese Skin: Erosion/breakdown on the anterior abdomen and inner thighs  CBG (last 3)  No results for input(s): GLUCAP in the last 72 hours.   Labs:  Lab Results  Component Value Date   WBC 22.0 (H) 03/06/2020   HGB 6.9 (LL) 03/06/2020   HCT 21.6 (L) 03/06/2020   MCV 96.4 03/06/2020   PLT 180 03/06/2020   NEUTROABS 18.2 (H) 03/05/2020    _0 @  Urine Studies No results for input(s): UHGB, CRYS in the last 72 hours.  Invalid input(s): UACOL, UAPR, USPG, UPH, UTP, UGL, [place], UBIL, UNIT, UROB, [place], UEPI, UWBC, [place], [place], [place], [place], [place]  Basic Metabolic Panel: Recent Labs  Lab 03/03/20 1021 03/03/20 1021 03/04/20 1110 03/04/20 1110 03/05/20 0500 03/06/20 0543  NA 135  --  136  --  137 139  K 3.9   < > 3.9   < > 3.8 4.0  CL 101  --  102  --  105 107  CO2 21*  --  22  --  24 24  GLUCOSE 142*  --  120*  --  136* 108*  BUN 32*  --  30*  --  29* 24*  CREATININE 1.61*  --  1.51*  --  1.50* 1.24*  CALCIUM 8.2*  --  7.9*  --  8.4* 8.5*  MG  --   --  1.3*  --  1.9 1.6*  PHOS  --   --  2.7  --  3.0  --    < > = values in this  interval not displayed.   GFR Estimated Creatinine Clearance: 62.8 mL/min (A) (by C-G formula based on SCr of 1.24 mg/dL (H)). Liver Function Tests: Recent Labs  Lab 03/03/20 1021 03/04/20 1110 03/05/20 0500 03/06/20 0543  AST 15 11* 14* 10*  ALT _1 ALKPHOS 52 54 67 65  BILITOT 0.3 0.6 0.6 0.5  PROT 5.8* 5.3* 5.5* 5.1*  ALBUMIN 2.5* 2.3* 2.4* 2.1*   No results for input(s): LIPASE, AMYLASE in the last 168 hours. No results for input(s): AMMONIA in the last 168 hours. Coagulation profile No results for input(s): INR, PROTIME in the last 168 hours.  CBC: Recent Labs  Lab 03/03/20 1021 03/04/20 1110 03/05/20 0500 03/06/20 0543  WBC 17.2* 17.6* 23.1* 22.0*  NEUTROABS 13.7* 14.1* 18.2*  --   HGB 7.9* 7.3* 7.3* 6.9*  HCT 23.9* 22.5* 22.3* 21.6*  MCV 93.0 94.9 95.3 96.4  PLT 127* 161 164 180   Cardiac Enzymes: No results for input(s):  CKTOTAL, CKMB, CKMBINDEX, TROPONINI in the last 168 hours. BNP: Invalid input(s): POCBNP CBG: No results for input(s): GLUCAP in the last 168 hours. D-Dimer No results for input(s): DDIMER in the last 72 hours. Hgb A1c No results for input(s): HGBA1C in the last 72 hours. Lipid Profile No results for input(s): CHOL, HDL, LDLCALC, TRIG, CHOLHDL, LDLDIRECT in the last 72 hours. Thyroid function studies No results for input(s): TSH, T4TOTAL, T3FREE, THYROIDAB in the last 72 hours.  Invalid input(s): FREET3 Anemia work up Recent Labs    03/05/20 0500  VITAMINB12 2,310*  FOLATE 3.8*  FERRITIN 861*  TIBC 157*  IRON 52  RETICCTPCT 0.8   Microbiology Recent Results (from the past 240 hour(s))  Virus culture     Status: None   Collection Time: 03/03/20 10:21 AM   Specimen: Skin Scraping  Result Value Ref Range Status   Viral Culture Comment  Final    Comment: (NOTE) Preliminary Report: No virus isolated at 24 hours. Next report to follow after 4 days. Performed At: [facility] [address] [place], [place]  [id number] Jolene Schimke MD LT:[id number]    Source of Sample VAGINA  Final    Comment: Performed at [facility], [address] [place], [place] [address]  Culture, blood (routine x 2)     Status: None (Preliminary result)   Collection Time: 03/03/20 11:20 AM   Specimen: BLOOD  Result Value Ref Range Status   Specimen Description   Final    BLOOD RIGHT ANTECUBITAL Performed at [facility], [address] [place], [place] [address]    Special Requests   Final    BOTTLES DRAWN AEROBIC AND ANAEROBIC Blood Culture adequate volume Performed at [facility], [address], [place], [place] [address]    Culture   Final    NO GROWTH 3 DAYS Performed at [facility], [address], [place], [place] [address]    Report Status PENDING  Incomplete  Culture, blood (routine x 2)     Status: None (Preliminary result)   Collection Time: 03/03/20 11:20 AM   Specimen: BLOOD  Result Value Ref Range Status   Specimen Description   Final    BLOOD PORTA CATH Performed at [facility], [address], [place], [place] [address]    Special Requests   Final    BOTTLES DRAWN AEROBIC AND ANAEROBIC Blood Culture adequate volume Performed at [place], [address], [place], [place] [address]    Culture   Final    NO GROWTH 3 DAYS Performed at [facility], [address] [place], [place] [address]    Report Status PENDING  Incomplete  Urine culture     Status: Abnormal   Collection Time: 03/03/20 11:20 AM   Specimen: Urine, Clean Catch  Result Value Ref Range Status   Specimen Description   Final    URINE, CLEAN CATCH Performed at [facility], [address], [place], [place] [address]    Special Requests   Final    NONE Performed at [facility], [address] [place], [place] [address]    Culture (A)  Final    <10,000 COLONIES/mL  INSIGNIFICANT GROWTH Performed at [facility], [address], [place], [place] [address]    Report Status 03/04/2020 FINAL  Final  SARS Coronavirus 2 by RT PCR (hospital order, performed in [facility] hospital lab) Nasopharyngeal  Nasopharyngeal Swab     Status: None   Collection Time: 03/03/20 11:20 AM   Specimen: Nasopharyngeal Swab  Result Value Ref Range Status   SARS Coronavirus 2 NEGATIVE NEGATIVE Final    Comment: (NOTE) SARS-CoV-2 target nucleic acids are NOT DETECTED.  The SARS-CoV-2 RNA is generally detectable in upper and lower respiratory specimens during the acute phase of infection. The lowest concentration of SARS-CoV-2 viral copies this assay can detect is 250 copies / mL. A negative result does not preclude SARS-CoV-2 infection and should not be used as the sole basis for treatment or other patient management decisions.  A negative result may occur with improper specimen collection / handling, submission of specimen other than nasopharyngeal swab, presence of viral mutation(s) within the areas targeted by this assay, and inadequate number of viral copies (<250 copies / mL). A negative result must be combined with clinical observations, patient history, and epidemiological information.  Fact Sheet for Patients:   StrictlyIdeas.no  Fact Sheet for Healthcare Providers: BankingDealers.co.za  This test is not yet approved or  cleared by the [place] FDA and has been authorized for detection and/or diagnosis of SARS-CoV-2 by FDA under an Emergency Use Authorization (EUA).  This EUA will remain in effect (meaning this test can be used) for the duration of the COVID-19 declaration under Section 564(b)(1) of the Act, 21 U.S.C. section 360bbb-3(b)(1), unless the authorization is terminated or revoked sooner.  Performed at [facility], [place] [address], [place], [place] [address]        Studies:  No results found.  Assessment: 64 y.o. [name] woman status post left breast upper outer quadrant and left axillary lymph node biopsies 11/12/2019 for a clinical T2 N2, stage IIIA invasive ductal carcinoma, grade 3, estrogen and progesterone receptor positive, HER-2 not amplified, with an MIB-1 of 70%             (a) the biopsied axillary lymph node was positive  (1) genetics testing 11/29/2019 through the Breast Cancer STAT Panel offered by [facility] found no deleterious mutations in ATM, BRCA1, BRCA2, CDH1, CHEK2, PALB2, PTEN, STK11 and TP53.  Additional testing through the Common Hereditary Cancers Panel confirmed no deleterious mutations in APC, ATM, AXIN2, BARD1, BMPR1A, BRCA1, BRCA2, BRIP1, CDH1, CDK4, CDKN2A (p14ARF), CDKN2A (p16INK4a), CHEK2, CTNNA1, DICER1, EPCAM (Deletion/duplication testing only), GREM1 (promoter region deletion/duplication testing only), KIT, MEN1, MLH1, MSH2, MSH3, MSH6, MUTYH, NBN, NF1, NHTL1, PALB2, PDGFRA, PMS2, POLD1, POLE, PTEN, RAD50, RAD51C, RAD51D, RNF43, SDHB, SDHC, SDHD, SMAD4, SMARCA4. STK11, TP53, TSC1, TSC2, and VHL.  The following genes were evaluated for sequence changes only: SDHA and HOXB13 c.251G>A variant only.  (2) status post left modified radical mastectomy 12/19/2019 for a pT2 pN1, stage IIB invasive ductal carcinoma, grade 3, with negative margins.             (a) 1 of 13 axillary lymph nodes removed was positive  (3) adjuvant chemotherapy will consisting of cyclophosphamide and doxorubicin x4 started 06/08/2021to be followed by weekly paclitaxel x12             (a) baseline echocardiogram 11/26/2019 shows an ejection fraction in the 65-70% range.             (b) first cycle of cyclophosphamide and doxorubicin dose reduced to assess tolerance  (4) adjuvant radiation as appropriate  (5) antiestrogens to follow  Plan:  [name] has been tolerating chemotherapy fairly well.  However, she has had multiple  hospitalizations is now readmitted for cellulitis to  her abdomen and inner thighs.  We have tentatively rescheduled her chemotherapy for 03/11/2020.  However, we will need to reevaluate her on that date to see if we can proceed.  CBC from today has been reviewed.  She has leukocytosis which is likely due to recent Neulasta administration.  Currently afebrile.  Recommend close monitoring of WBC.  She is more anemic today with a hemoglobin of 6.9.  1 unit PRBCs have been ordered.  Recommend PRBC transfusion to keep her hemoglobin above 7.  Therapy is recommending SNF placement, but the patient is refusing at this time.  I think she would benefit from placement for rehabilitation as she does not have a lot of support in her home.  Mikey Bussing, NP 03/06/2020  10:47 AM

## 2020-03-07 ENCOUNTER — Inpatient Hospital Stay (HOSPITAL_COMMUNITY): Payer: Medicaid Other

## 2020-03-07 ENCOUNTER — Encounter (HOSPITAL_COMMUNITY): Payer: Self-pay | Admitting: Internal Medicine

## 2020-03-07 ENCOUNTER — Other Ambulatory Visit: Payer: Self-pay | Admitting: Oncology

## 2020-03-07 DIAGNOSIS — D72829 Elevated white blood cell count, unspecified: Secondary | ICD-10-CM

## 2020-03-07 DIAGNOSIS — N179 Acute kidney failure, unspecified: Secondary | ICD-10-CM | POA: Diagnosis not present

## 2020-03-07 DIAGNOSIS — R238 Other skin changes: Secondary | ICD-10-CM

## 2020-03-07 DIAGNOSIS — D649 Anemia, unspecified: Secondary | ICD-10-CM | POA: Diagnosis not present

## 2020-03-07 DIAGNOSIS — I10 Essential (primary) hypertension: Secondary | ICD-10-CM | POA: Diagnosis not present

## 2020-03-07 DIAGNOSIS — Z170 Estrogen receptor positive status [ER+]: Secondary | ICD-10-CM

## 2020-03-07 DIAGNOSIS — D696 Thrombocytopenia, unspecified: Secondary | ICD-10-CM | POA: Diagnosis not present

## 2020-03-07 DIAGNOSIS — C773 Secondary and unspecified malignant neoplasm of axilla and upper limb lymph nodes: Secondary | ICD-10-CM | POA: Diagnosis not present

## 2020-03-07 DIAGNOSIS — C50412 Malignant neoplasm of upper-outer quadrant of left female breast: Secondary | ICD-10-CM | POA: Diagnosis not present

## 2020-03-07 DIAGNOSIS — Z9012 Acquired absence of left breast and nipple: Secondary | ICD-10-CM

## 2020-03-07 DIAGNOSIS — T451X5A Adverse effect of antineoplastic and immunosuppressive drugs, initial encounter: Secondary | ICD-10-CM | POA: Diagnosis not present

## 2020-03-07 DIAGNOSIS — D6481 Anemia due to antineoplastic chemotherapy: Secondary | ICD-10-CM | POA: Diagnosis not present

## 2020-03-07 LAB — COMPREHENSIVE METABOLIC PANEL
ALT: 8 U/L (ref 0–44)
AST: 13 U/L — ABNORMAL LOW (ref 15–41)
Albumin: 2.3 g/dL — ABNORMAL LOW (ref 3.5–5.0)
Alkaline Phosphatase: 82 U/L (ref 38–126)
Anion gap: 10 (ref 5–15)
BUN: 21 mg/dL (ref 8–23)
CO2: 24 mmol/L (ref 22–32)
Calcium: 8.5 mg/dL — ABNORMAL LOW (ref 8.9–10.3)
Chloride: 104 mmol/L (ref 98–111)
Creatinine, Ser: 1.27 mg/dL — ABNORMAL HIGH (ref 0.44–1.00)
GFR calc Af Amer: 52 mL/min — ABNORMAL LOW (ref >60)
GFR calc non Af Amer: 45 mL/min — ABNORMAL LOW (ref >60)
Glucose, Bld: 107 mg/dL — ABNORMAL HIGH (ref 70–99)
Potassium: 3.9 mmol/L (ref 3.5–5.1)
Sodium: 138 mmol/L (ref 135–145)
Total Bilirubin: 0.9 mg/dL (ref 0.3–1.2)
Total Protein: 5.6 g/dL — ABNORMAL LOW (ref 6.5–8.1)

## 2020-03-07 LAB — BPAM RBC
Blood Product Expiration Date: 202107232359
ISSUE DATE / TIME: 202107081155
Unit Type and Rh: 6200

## 2020-03-07 LAB — CBC
HCT: 25.7 % — ABNORMAL LOW (ref 36.0–46.0)
Hemoglobin: 8.3 g/dL — ABNORMAL LOW (ref 12.0–15.0)
MCH: 30.3 pg (ref 26.0–34.0)
MCHC: 32.3 g/dL (ref 30.0–36.0)
MCV: 93.8 fL (ref 80.0–100.0)
Platelets: 195 10*3/uL (ref 150–400)
RBC: 2.74 MIL/uL — ABNORMAL LOW (ref 3.87–5.11)
RDW: 17 % — ABNORMAL HIGH (ref 11.5–15.5)
WBC: 21.9 10*3/uL — ABNORMAL HIGH (ref 4.0–10.5)
nRBC: 0.1 % (ref 0.0–0.2)

## 2020-03-07 LAB — TYPE AND SCREEN
ABO/RH(D): A POS
Antibody Screen: NEGATIVE
Unit division: 0

## 2020-03-07 IMAGING — CT CT ABD-PELV W/ CM
3 of 6 series · 16 of 46 positions shown, 18 images · IV contrast (OMNIPAQUE)
Comparison: CT [DATE]. Renal ultrasound [DATE]

CLINICAL DATA: Generalized abdominal pain. Ongoing diarrhea.
History of left breast cancer with current chemotherapy.

EXAM:
CT ABDOMEN AND PELVIS WITH CONTRAST
TECHNIQUE: Multidetector CT imaging of the abdomen and pelvis was performed
using the standard protocol following bolus administration of
intravenous contrast.
CONTRAST:  100mL OMNIPAQUE IOHEXOL 300 MG/ML  SOLN

[Series 2: axial st · axial · 0.98mm/px · z∈[-220,+160]mm · 11 of 92 slices shown, 13 images]
[im 8/92  soft-tissue]
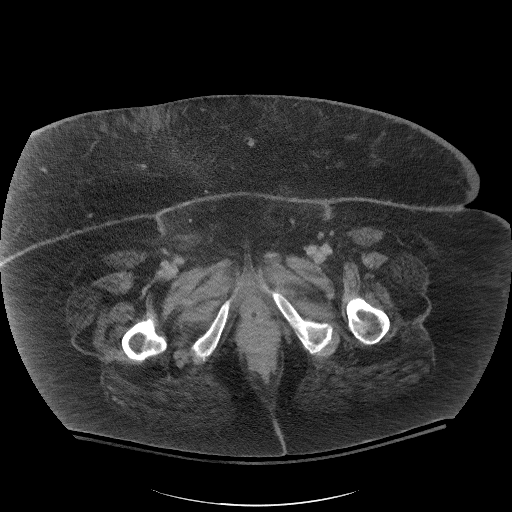
[im 8/92  bone]
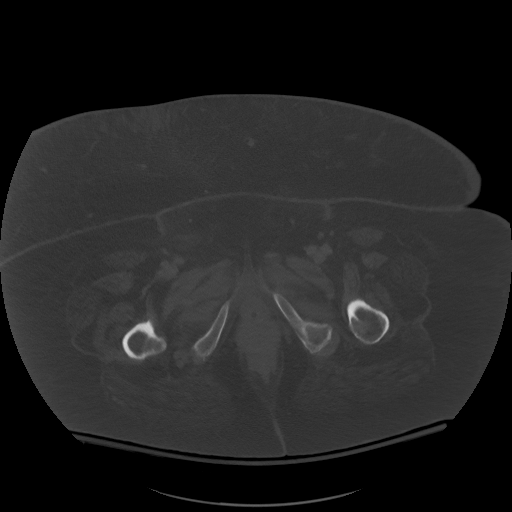
[im 16/92  soft-tissue]
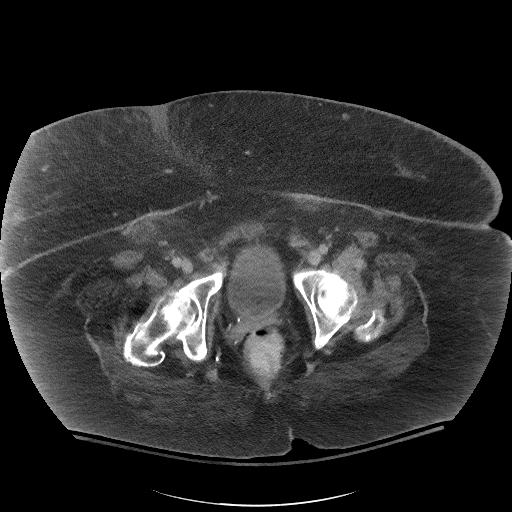
[im 23/92  soft-tissue]
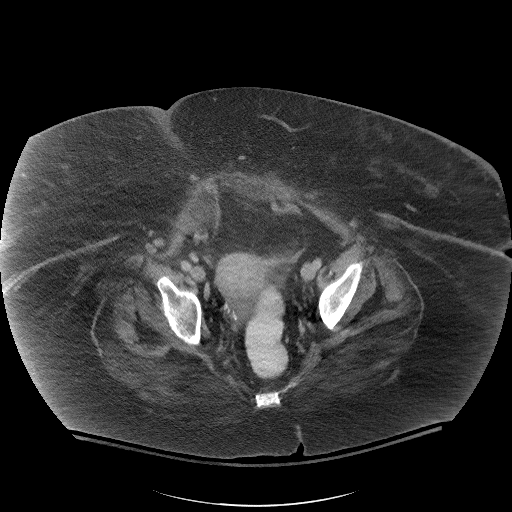
[im 31/92  soft-tissue]
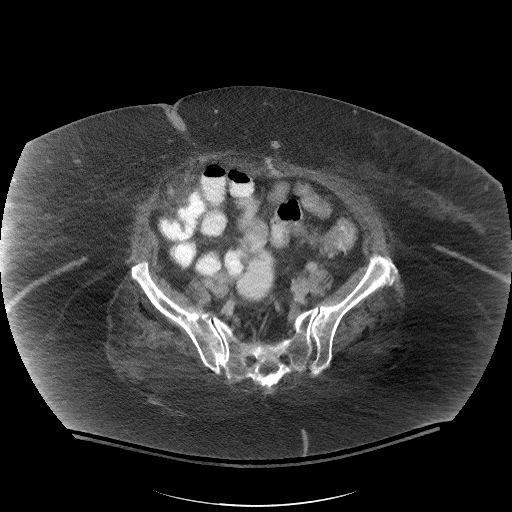
[im 38/92  soft-tissue]
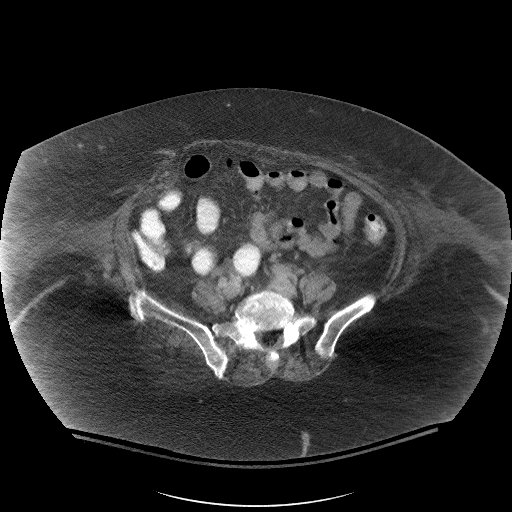
[im 46/92  soft-tissue]
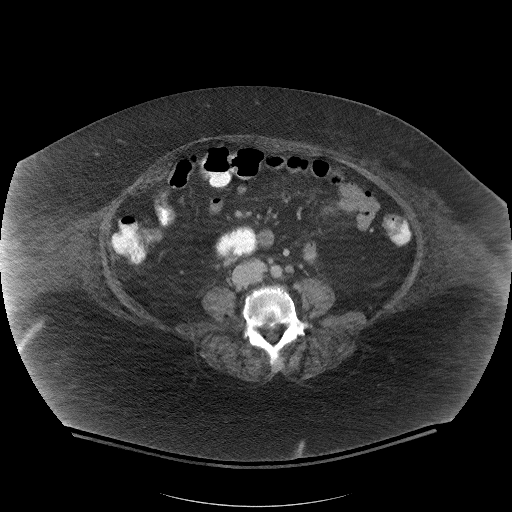
[im 54/92  soft-tissue]
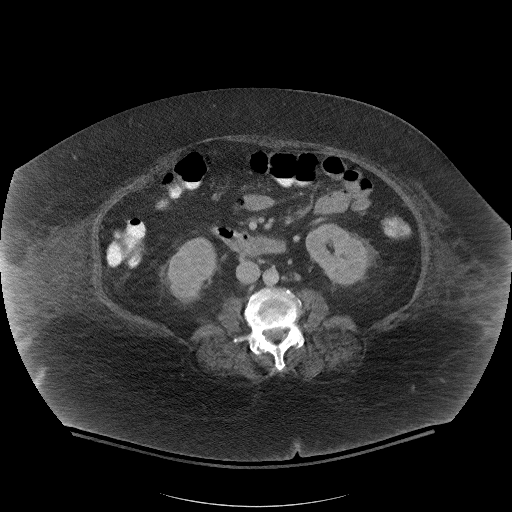
[im 61/92  soft-tissue]
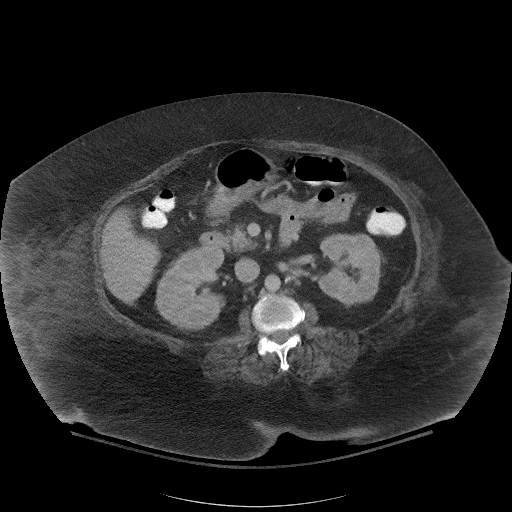
[im 69/92  soft-tissue]
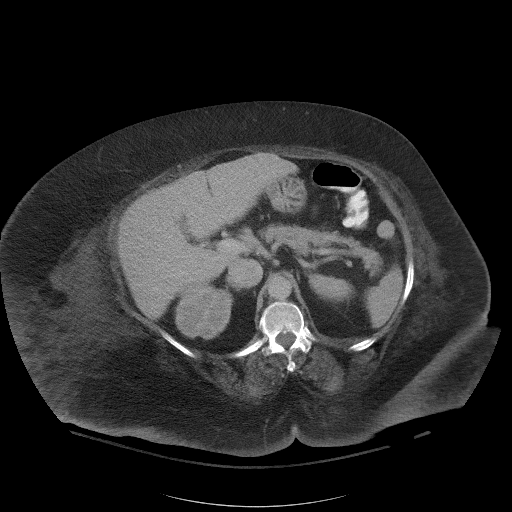
[im 69/92  bone]
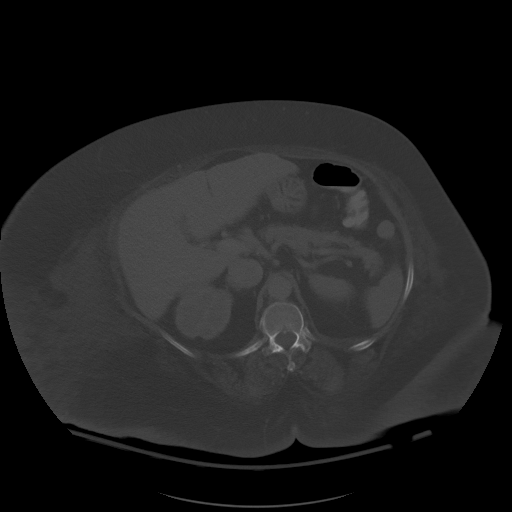
[im 76/92  soft-tissue]
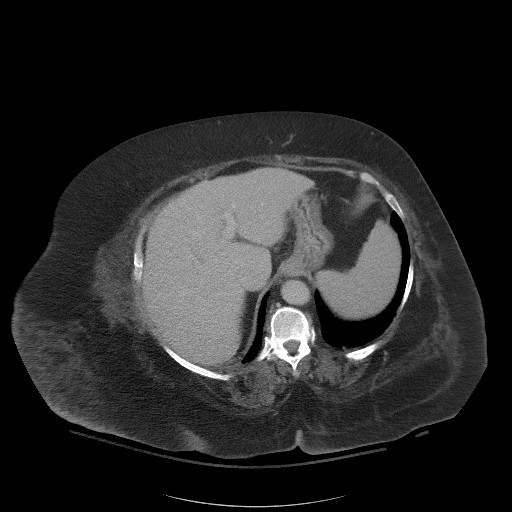
[im 84/92  soft-tissue]
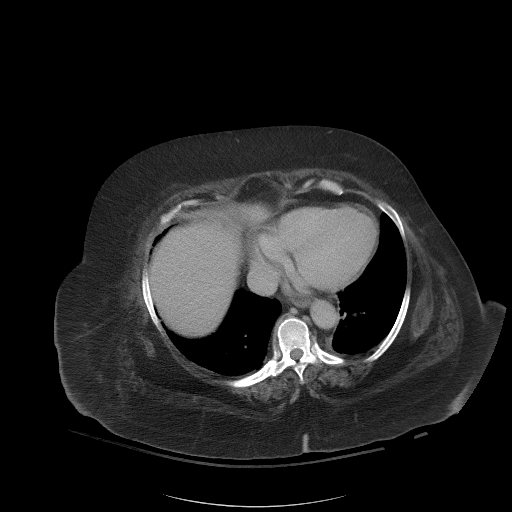

[Series 4: coronal st · coronal · 0.92mm/px · 3 of 90 slices shown]
[im 30/90  soft-tissue]
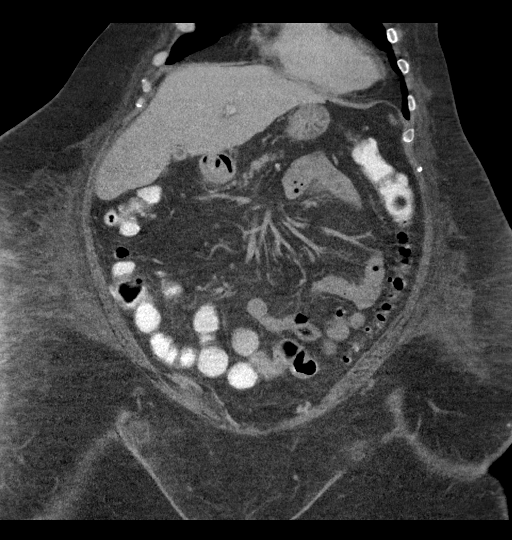
[im 40/90  soft-tissue]
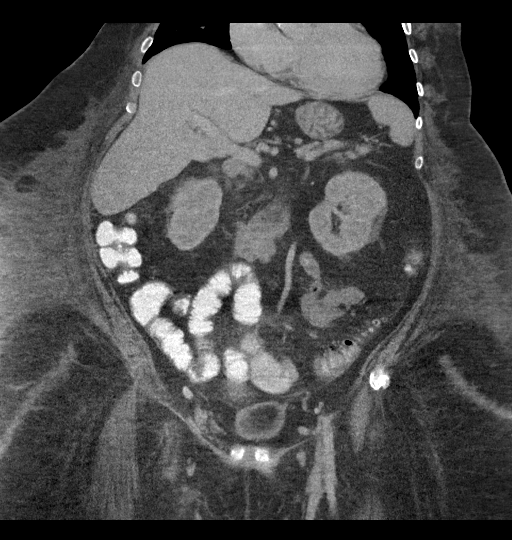
[im 50/90  soft-tissue]
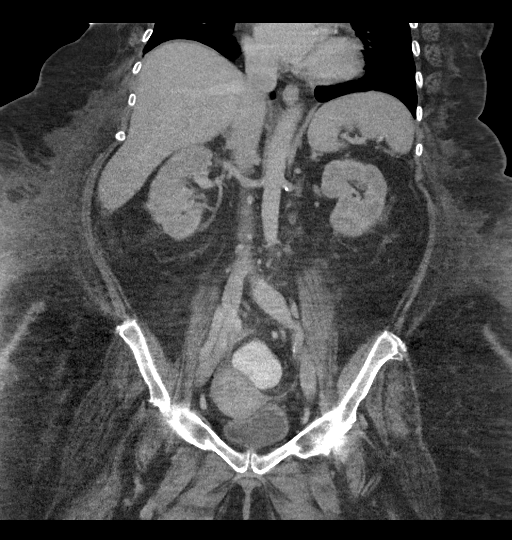

[Series 6: lung bases · axial · 0.98mm/px · z∈[+88,+104]mm · 2 of 64 slices shown]
[im 8/64  bone]
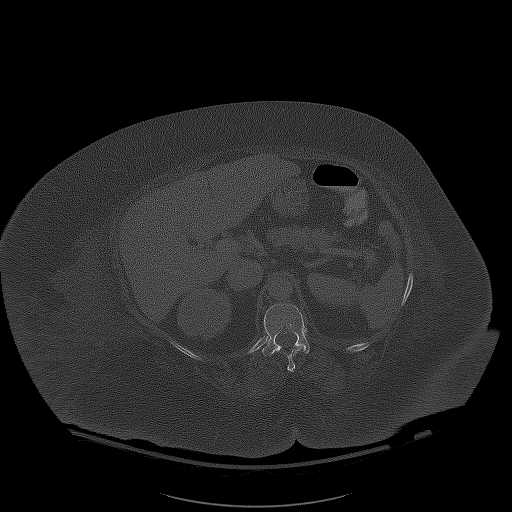
[im 16/64  bone]
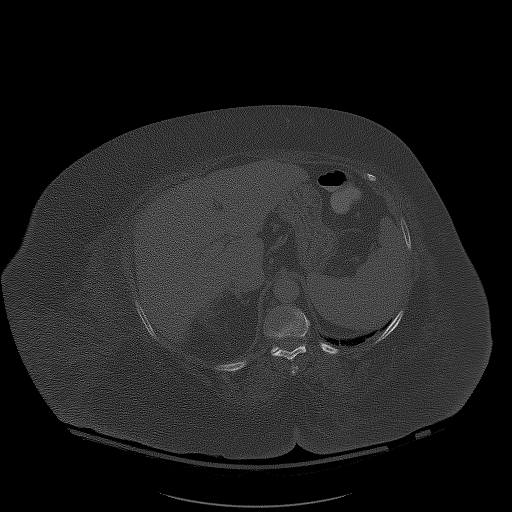

[16 of 46 positions shown; findings below may reference images not displayed]

FINDINGS: Lower chest: No focal airspace disease or pleural effusion. There
are coronary artery calcifications.

Hepatobiliary: Prominent liver spanning 18 cm cranial caudal.
Decreased hepatic steatosis from prior exam. Questionable nodular
hepatic contours. Decompressed gallbladder without gallstones or
pericholecystic inflammation.

Pancreas: No ductal dilatation or inflammation.

Spleen: Normal in size without focal abnormality. Splenule
anteriorly unchanged from prior.

Adrenals/Urinary Tract: No adrenal nodule. There is no
hydronephrosis. Symmetric bilateral perinephric edema. There are
nonobstructing stones in the right kidney, largest measuring 7 mm.
Parenchymal calcification in the lower left kidney. There is
symmetric renal excretion on 6 minutes delay scan. Partially
distended urinary bladder. Air-fluid level in the bladder, Foley
catheter in place. Equivocal bladder wall thickening.

Stomach/Bowel: Decompressed stomach. No small bowel obstruction or
inflammation. Administered enteric contrast reaches the colon.
Normal terminal ileum. No appendicitis, the appendix is not well
characterized. Enteric contrast reaches the distal colon. Equivocal
mild wall thickening of the distal descending and proximal sigmoid
colon but no pericolonic inflammation. This is in the region of
multiple diverticula. There is no acute diverticulitis. No abnormal
rectal distention.

Vascular/Lymphatic: Minor aortic atherosclerosis. Patent portal
vein. Few lower retroperitoneal nodes that are not enlarged by size
criteria.

Reproductive: Uterus and bilateral adnexa are unremarkable.

Other: No free air or ascites. Subcutaneous edema in the bilateral
flank soft tissues.

Musculoskeletal: Diffuse degenerative change throughout the spine.
Mild anterior wedging of superior endplate of L3 and L4 vertebra,
age indeterminate but new from [MW]. There are no acute or
suspicious osseous abnormalities.
IMPRESSION: 1. Equivocal mild wall thickening of the distal descending and
proximal sigmoid colon but no pericolonic inflammation. An element
of colitis is considered in the setting of diarrhea. This is in the
region of multiple diverticula, but no focal diverticulum.
2. Nonobstructing right nephrolithiasis.
3. Questionable nodular hepatic contours, can be seen with
cirrhosis. Recommend correlation with cirrhosis risk factors.
4. Mild anterior wedging of superior endplate of L3 and L4 vertebra,
age indeterminate but new from [MW].

Aortic Atherosclerosis ([MW]-[MW]).

## 2020-03-07 MED ORDER — LACTATED RINGERS IV SOLN: INTRAVENOUS | Status: AC

## 2020-03-07 MED ORDER — IOHEXOL 9 MG/ML PO SOLN
500.0000 mL | ORAL | Status: AC
  Administered 2020-03-07: 500 mL via ORAL

## 2020-03-07 MED ORDER — IOHEXOL 9 MG/ML PO SOLN
ORAL | Status: AC
  Administered 2020-03-07: 500 mL
  Filled 2020-03-07: qty 1000

## 2020-03-07 MED ORDER — IOHEXOL 300 MG/ML  SOLN
100.0000 mL | Freq: Once | INTRAMUSCULAR | Status: AC | PRN
  Administered 2020-03-07: 100 mL via INTRAVENOUS

## 2020-03-07 NOTE — Progress Notes (Signed)
[name]   DOB:March 22, 1956   VO#:[id number]   VQQ#:[id number]  Subjective:  [name] is "frustrated; I don't know what's going on." We discussed the reasons she came back to the hospital and why SNF/Rehab has been suggested. No family in room   Objective: [name] examined in recliner Vitals:   03/06/20 1542 03/06/20 2048  BP: 105/74 119/74  Pulse: 82 81  Resp: 18 19  Temp: 98.6 F (37 C) 98.2 F (36.8 C)  SpO2: 100% 97%    Body mass index is 60.46 kg/m.  Intake/Output Summary (Last 24 hours) at 03/07/2020 0747 Last data filed at 03/07/2020 0530 Gross per 24 hour  Intake 456.25 ml  Output 925 ml  Net -468.75 ml     CBG (last 3)  No results for input(s): GLUCAP in the last 72 hours.   Labs:  Lab Results  Component Value Date   WBC 21.9 (H) 03/07/2020   HGB 8.3 (L) 03/07/2020   HCT 25.7 (L) 03/07/2020   MCV 93.8 03/07/2020   PLT 195 03/07/2020   NEUTROABS 18.2 (H) 03/05/2020    _0 @  Urine Studies No results for input(s): UHGB, CRYS in the last 72 hours.  Invalid input(s): UACOL, UAPR, USPG, UPH, UTP, UGL, [place], UBIL, UNIT, UROB, [place], UEPI, UWBC, [place], [place], [place], [place], [place]  Basic Metabolic Panel: Recent Labs  Lab 03/03/20 1021 03/03/20 1021 03/04/20 1110 03/04/20 1110 03/05/20 0500 03/05/20 0500 03/06/20 0543 03/07/20 0500  NA 135  --  136  --  137  --  139 138  K 3.9   < > 3.9   < > 3.8   < > 4.0 3.9  CL 101  --  102  --  105  --  107 104  CO2 21*  --  22  --  24  --  24 24  GLUCOSE 142*  --  120*  --  136*  --  108* 107*  BUN 32*  --  30*  --  29*  --  24* 21  CREATININE 1.61*  --  1.51*  --  1.50*  --  1.24* 1.27*  CALCIUM 8.2*  --  7.9*  --  8.4*  --  8.5* 8.5*  MG  --   --  1.3*  --  1.9  --  1.6*  --   PHOS  --   --  2.7  --  3.0  --   --   --    < > = values in this interval not displayed.   GFR Estimated Creatinine Clearance: 61.3 mL/min (A) (by C-G formula based on SCr of 1.27 mg/dL (H)). Liver Function  Tests: Recent Labs  Lab 03/03/20 1021 03/04/20 1110 03/05/20 0500 03/06/20 0543 03/07/20 0500  AST 15 11* 14* 10* 13*  ALT _1 ALKPHOS 52 54 67 65 82  BILITOT 0.3 0.6 0.6 0.5 0.9  PROT 5.8* 5.3* 5.5* 5.1* 5.6*  ALBUMIN 2.5* 2.3* 2.4* 2.1* 2.3*   No results for input(s): LIPASE, AMYLASE in the last 168 hours. No results for input(s): AMMONIA in the last 168 hours. Coagulation profile No results for input(s): INR, PROTIME in the last 168 hours.  CBC: Recent Labs  Lab 03/03/20 1021 03/03/20 1021 03/04/20 1110 03/05/20 0500 03/06/20 0543 03/06/20 1900 03/07/20 0500  WBC 17.2*  --  17.6* 23.1* 22.0*  --  21.9*  NEUTROABS 13.7*  --  14.1* 18.2*  --   --   --  HGB 7.9*   < > 7.3* 7.3* 6.9* 8.1* 8.3*  HCT 23.9*   < > 22.5* 22.3* 21.6* 25.3* 25.7*  MCV 93.0  --  94.9 95.3 96.4  --  93.8  PLT 127*  --  161 164 180  --  195   < > = values in this interval not displayed.   Cardiac Enzymes: No results for input(s): CKTOTAL, CKMB, CKMBINDEX, TROPONINI in the last 168 hours. BNP: Invalid input(s): POCBNP CBG: No results for input(s): GLUCAP in the last 168 hours. D-Dimer No results for input(s): DDIMER in the last 72 hours. Hgb A1c No results for input(s): HGBA1C in the last 72 hours. Lipid Profile No results for input(s): CHOL, HDL, LDLCALC, TRIG, CHOLHDL, LDLDIRECT in the last 72 hours. Thyroid function studies No results for input(s): TSH, T4TOTAL, T3FREE, THYROIDAB in the last 72 hours.  Invalid input(s): FREET3 Anemia work up Recent Labs    03/05/20 0500  VITAMINB12 2,310*  FOLATE 3.8*  FERRITIN 861*  TIBC 157*  IRON 52  RETICCTPCT 0.8   Microbiology Recent Results (from the past 240 hour(s))  Virus culture     Status: None   Collection Time: 03/03/20 10:21 AM   Specimen: Skin Scraping  Result Value Ref Range Status   Viral Culture Comment  Final    Comment: (NOTE) Preliminary Report: No virus isolated at 24 hours. Next report to follow after  4 days. Performed At: [facility] [place], [place] [id number] Rush Farmer MD SW:[id number]    Source of Sample VAGINA  Final    Comment: Performed at [facility] [address] [place], [place] [address]  Culture, blood (routine x 2)     Status: None (Preliminary result)   Collection Time: 03/03/20 11:20 AM   Specimen: BLOOD  Result Value Ref Range Status   Specimen Description   Final    BLOOD RIGHT ANTECUBITAL Performed at [facility] [address] [place], [place] [address]    Special Requests   Final    BOTTLES DRAWN AEROBIC AND ANAEROBIC Blood Culture adequate volume Performed at [facility] [address] [place], [place] [address]    Culture   Final    NO GROWTH 3 DAYS Performed at [facility], [place] [address] [place], [place] [address]    Report Status PENDING  Incomplete  Culture, blood (routine x 2)     Status: None (Preliminary result)   Collection Time: 03/03/20 11:20 AM   Specimen: BLOOD  Result Value Ref Range Status   Specimen Description   Final    BLOOD PORTA CATH Performed at [facility] [address] [place], [place] [address]    Special Requests   Final    BOTTLES DRAWN AEROBIC AND ANAEROBIC Blood Culture adequate volume Performed at [facility] [address] [place], [place] [address]    Culture   Final    NO GROWTH 3 DAYS Performed at [facility], [place] [address], [place], [place] [address]    Report Status PENDING  Incomplete  Urine culture     Status: Abnormal   Collection Time: 03/03/20 11:20 AM   Specimen: Urine, Clean Catch  Result Value Ref Range Status   Specimen Description   Final    URINE, CLEAN CATCH Performed at [facility], [place] [address], [place], [place] [address]    Special Requests   Final  NONE Performed at [facility], [place] [address] [place], [place] [address]    Culture (A)  Final    <10,000 COLONIES/mL INSIGNIFICANT GROWTH Performed at [facility] [address], [place], [place] [address]    Report Status 03/04/2020 FINAL  Final  SARS Coronavirus 2 by RT PCR (hospital order, performed in [facility] hospital lab) Nasopharyngeal Nasopharyngeal Swab     Status: None   Collection Time: 03/03/20 11:20 AM   Specimen: Nasopharyngeal Swab  Result Value Ref Range Status   SARS Coronavirus 2 NEGATIVE NEGATIVE Final    Comment: (NOTE) SARS-CoV-2 target nucleic acids are NOT DETECTED.  The SARS-CoV-2 RNA is generally detectable in upper and lower respiratory specimens during the acute phase of infection. The lowest concentration of SARS-CoV-2 viral copies this assay can detect is 250 copies / mL. A negative result does not preclude SARS-CoV-2 infection and should not be used as the sole basis for treatment or other patient management decisions.  A negative result may occur with improper specimen collection / handling, submission of specimen other than nasopharyngeal swab, presence of viral mutation(s) within the areas targeted by this assay, and inadequate number of viral copies (<250 copies / mL). A negative result must be combined with clinical observations, patient history, and epidemiological information.  Fact Sheet for Patients:   StrictlyIdeas.no  Fact Sheet for Healthcare Providers: BankingDealers.co.za  This test is not yet approved or  cleared by the [place] FDA and has been authorized for detection and/or diagnosis of SARS-CoV-2 by FDA under an Emergency Use Authorization (EUA).  This EUA will remain in effect (meaning this test can be used) for the duration of the COVID-19 declaration under Section 564(b)(1) of the Act, 21 U.S.C. section 360bbb-3(b)(1), unless the authorization is terminated or revoked sooner.  Performed at [facility], [place] [address] [place], [place] [address]       Studies:  No results found.  Assessment: 64 y.o. [name] status post left breast upper outer quadrant and left axillary lymph node biopsies 11/12/2019 for a clinical T2 N2, stage IIIA invasive ductal carcinoma, grade 3, estrogen and progesterone receptor positive, HER-2 not amplified, with an MIB-1 of 70%             (a) the biopsied axillary lymph node was positive  (1) genetics testing 11/29/2019 through the Breast Cancer STAT Panel offered by [facility] found no deleterious mutations in ATM, BRCA1, BRCA2, CDH1, CHEK2, PALB2, PTEN, STK11 and TP53.  Additional testing through the Common Hereditary Cancers Panel confirmed no deleterious mutations in APC, ATM, AXIN2, BARD1, BMPR1A, BRCA1, BRCA2, BRIP1, CDH1, CDK4, CDKN2A (p14ARF), CDKN2A (p16INK4a), CHEK2, CTNNA1, DICER1, EPCAM (Deletion/duplication testing only), GREM1 (promoter region deletion/duplication testing only), KIT, MEN1, MLH1, MSH2, MSH3, MSH6, MUTYH, NBN, NF1, NHTL1, PALB2, PDGFRA, PMS2, POLD1, POLE, PTEN, RAD50, RAD51C, RAD51D, RNF43, SDHB, SDHC, SDHD, SMAD4, SMARCA4. STK11, TP53, TSC1, TSC2, and VHL.  The following genes were evaluated for sequence changes only: SDHA and HOXB13 c.251G>A variant only.  (2) status post left modified radical mastectomy 12/19/2019 for a pT2 pN1, stage IIB invasive ductal carcinoma, grade 3, with negative margins.             (a) 1 of 13 axillary lymph nodes removed was positive  (3) adjuvant chemotherapy consisting of cyclophosphamide and doxorubicin in dose dense fashion x4 starting 01/22/2020 to be followed by weekly paclitaxel x12             (  a) baseline echocardiogram 11/26/2019 shows an ejection fraction in the 65-70% range.             (b) cyclophosphamide and doxorubicin dose reduced 18% given comorbidities  (4) adjuvant radiation as appropriate  (5) antiestrogens to follow   Plan:  I went over the overall  situation with [name], who as noted above is very frustrated and doesn't "know what's going on." I explained her treatment is very aggressive and I am concerned we may not be able to continue as she has very little help at home. I urged her to accept SNF/Rehab placement so we can continue to treat her safely.  At the end of out discussion she told me she would accept placement. Ideally she would receive Rehab to improve mobility and address the morbid obesity question.  She is scheduled to see me 07/13 at 8:30 AM and we will proceed to chemo same day assuming she is stable. I am going to switch her to weekly paclitaxel which is much better tolerated.  Please let me know if I can be of further help at this point.     Chauncey Cruel, MD 03/07/2020  7:47 AM Medical Oncology and Hematology [facility] [address] [place], [place] [address] Tel. [phone]    Fax. [phone]

## 2020-03-07 NOTE — TOC Progression Note (Signed)
Transition of Care [facility]) - Progression Note    Patient Details  Name: [name] MRN: [id number] Date of Birth: Mar 20, 1956  Transition of [facility]) CM/SW Contact  Darron Stuck, Marjie Skiff, RN Phone Number: 03/07/2020, 3:31 PM  Clinical Narrative:    This CM spoke with pt at bedside for dc planning. Pt now agrees to SNF placement. FL2 was faxed out and bed offers received. Bed offers given to pt and pt chose [facility]. Liaison made aware and will start auth with pt insurance company. TOC will continue to follow.   Expected Discharge Plan: Skilled Nursing Facility Barriers to Discharge: Continued Medical Work up  Expected Discharge Plan and Services Expected Discharge Plan: [place]   Discharge Planning Services: CM Consult   Living arrangements for the past 2 months: Single Family Home                                       Social Determinants of Health (SDOH) Interventions    Readmission Risk Interventions Readmission Risk Prevention Plan 03/06/2020 02/13/2020  Transportation Screening Complete Complete  PCP or Specialist Appt within 3-5 Days Not Complete -  Not Complete comments Unsure of dc date at this time -  [place] or [place] Complete -  Social Work Consult for [place] Planning/Counseling Complete -  Palliative Care Screening Not Applicable -  Medication Review Press photographer) Complete Complete  PCP or Specialist appointment within 3-5 days of discharge - Complete  [place] or [place] - Complete  SW Recovery Care/Counseling Consult - Complete  [place] - Not Applicable  Some recent data might be hidden

## 2020-03-07 NOTE — Progress Notes (Signed)
Physical Therapy Treatment Patient Details Name: [name] MRN: [id number] DOB: [birth year] Today's Date: 03/07/2020    History of Present Illness 64 yo female admitted with skin breakdown/cellulitis, weakness, difficulty functioning at home. Hx of breast ca s/p mastectomy, lymphedema, poor wound healing, venous insufficiency, CKD, morbid obesity, falls    PT Comments    Pt was OOB in recliner via [employer].  General transfer comment: attempted sit to stand from recliner level however pt unable to load off hips due to weakness and BMI with c/o pain/discomfort.  Pt c/o needing to have a BM.  Used [employer] to hover pt over [facility].  Required extended time to allow completion.  Assisted with peri care then used [employer] Lift to assist back to bed. General bed mobility comments: side to side rolling to remove [place] and position to comfort.    Follow Up Recommendations  SNF     Equipment Recommendations  None recommended by PT    Recommendations for Other Services       Precautions / Restrictions Precautions Precautions: Fall Precaution Comments: skin breakdown 2* body habitus Restrictions Other Position/Activity Restrictions: BMI 60 height 5'1"    Mobility  Bed Mobility Overal bed mobility: Needs Assistance Bed Mobility: Rolling Rolling: Mod assist;Max assist;+2 for physical assistance;+2 for safety/equipment         General bed mobility comments: side to side rolling to remove Maxi Sky Lift Pad and position to comfort  Transfers Overall transfer level: Needs assistance Equipment used: Rolling walker (2 wheeled) Transfers: Sit to/from Stand Sit to Stand: Max assist;Total assist         General transfer comment: attempted sit to stand from recliner level however pt unable to load off hips due to weakness and BMI with c/o pain/discomfort  Ambulation/Gait             General Gait Details: unable this session, pt was unable to stand from recliner  height   Stairs             Wheelchair Mobility    Modified Rankin (Stroke Patients Only)       Balance                                            Cognition Arousal/Alertness: Awake/alert;Lethargic   Overall Cognitive Status: Within Functional Limits for tasks assessed                                 General Comments: AxO x 2 required repeat instruction to stay focused, easily drifting      Exercises      General Comments        Pertinent Vitals/Pain Faces Pain Scale: Hurts little more Pain Location: peri and anal area "burning" red skin breakdown Pain Descriptors / Indicators: Grimacing Pain Intervention(s): Monitored during session    Home Living                      Prior Function            PT Goals (current goals can now be found in the care plan section) Progress towards PT goals: Progressing toward goals    Frequency    Min 3X/week      PT Plan Current plan remains appropriate  Co-evaluation              AM-PAC PT "6 Clicks" Mobility   Outcome Measure    Help needed moving from lying on your back to sitting on the side of a flat bed without using bedrails?: Total Help needed moving to and from a bed to a chair (including a wheelchair)?: Total Help needed standing up from a chair using your arms (e.g., wheelchair or bedside chair)?: Total Help needed to walk in hospital room?: Total Help needed climbing 3-5 steps with a railing? : Total 6 Click Score: 5    End of Session Equipment Utilized During Treatment: Gait belt Activity Tolerance: Patient limited by fatigue;Patient limited by pain Patient left: in bed;with bed alarm set;with call bell/phone within reach Nurse Communication: Mobility status (pt had a large watery brown BM) PT Visit Diagnosis: Muscle weakness (generalized) (M62.81);Pain;Other abnormalities of gait and mobility (R26.89)     Time: 1500-1540 PT Time  Calculation (min) (ACUTE ONLY): 40 min  Charges:  $Therapeutic Activity: 38-52 mins                     Rica Koyanagi  PTA Acute  Rehabilitation Services Pager      [phone] Office      [phone]

## 2020-03-07 NOTE — Progress Notes (Signed)
PROGRESS NOTE    [name]  KWI:[id number] DOB: 09-06-1955 DOA: 03/03/2020 PCP: [name], MD   Chef Complaints: Skin breakdown  Brief Narrative: 64 YO female with breast cancer status postmastectomy in April 2021 with negative margins undergoing adjuvant chemo Adriamycin plus cyclophosphamide last dose last week prior to admission, morbidly obese BMI 60, poorly mobile, lymphedema/venous insufficiency/CKD, hypertension recent admission for the inner thighs and perineum, who lives alone and a family member comes in to take care of her, has been incontinent with urine at all times noted to have a significant skin breakdown in the shower, sent to the ED for evaluation  In the ED found to have a lot of skin breakdown maceration several vesicles and also significant leukocytosis anemia, thrombocytopenia elevated BUN/creatinine of 1.6 up from 1.3 on June/30.  Foley catheter was placed, patient was admitted for IV antibiotics IV fluids hydration  Subjective: Complains of abdominal pain only crying is in the mid abdomen epigastrium Hemoglobin improved to 8.3 g and stable.  Assessment & Plan:  Multiple skin breakdown cellulitis in the inner thighs and perineum abdominal pannus area with wound in different stages: Multifactorial with poor mobility/morbid obesity, incontinence of urine limited care at home.  Initial lactic acidosis resolved.  Leukocytosis downtrending likely from Neulasta rather than cellulitis.  Continue Foley to give the area dry and to promote healing, MRSA screen -3 weeks ago, continue Ancef for now, continue pain control wound care and a skilled nursing facility placement.  Acute abdominal pain mid abdomen/epigastrium, will obtain CT abdomen further evaluate.  Keep on gentle IV fluid hydration.  Leukocytosis: Likely combination of Neulasta and also with infection.  Culture no growth so far.  Oral thrush continue Diflucan..  Essential hypertension: On Coreg and  hydralazine.  Controlled.  AKI: Creatinine 0.8 at baseline peaked at 1.6 improving, at 1.2.  Continue oral hydration.  Monitor closely.  Keep on gentle IV fluids while she is getting CT scan given the setting of acute abdominal pain.  Metabolic acidosis: Improved.  Folate deficiency/anemia of chronic disease hemoglobin 8.5 on /3-: On admission 7.9 g and has decreased to 6.9, suspect multifactorial in the setting of cancer, folate deficiency.  Patient received 1 unit PRBC transfusion hemoglobin has improved in mid 8 g range.  Low folic acid at 3.8 on replacement.  Ferritin is stable B12 normal  Deconditioning/poor mobility/malaise weakness: Encourage ambulation continue PT OT SHE will need a skilled nursing facility.  T2 N1 stage IIb grade 3 ER positive breast cancer followed by medical oncology chemotherapy session 7/6 has been posted for 7/13 as patient did not go to outpatient follow-up instead came to the ED.  I have notified her oncology and has seen the patient planning for outpatient follow-up/chemo regimen   Gout continue colchicine.  Thrombocytopenia platelet counts are stable and normal . Hypomagnesemia: At 1.6, add magnesium oxide x 7 days.   Super morbid obesity with BMI 60.  Will benefit weight loss at the lifestyle  Pressure ulcer present on admission on buttock as below: We will continue wound care 03/03/20 2308  Location: Buttocks  Location Orientation: Posterior  Staging: Stage 3 -  Full thickness tissue loss. Subcutaneous fat may be visible but bone, tendon or muscle are NOT exposed.  Wound Description (Comments):   Present on Admission: Yes     Pressure Injury 03/03/20 Buttocks Stage 3 -  Full thickness tissue loss. Subcutaneous fat may be visible but bone, tendon or muscle are NOT exposed. (Active)  03/03/20 2308  Location: Buttocks  Location Orientation:   Staging: Stage 3 -  Full thickness tissue loss. Subcutaneous fat may be visible but bone, tendon or muscle are  NOT exposed.  Wound Description (Comments):   Present on Admission: Yes   DVT prophylaxis: lovenox  Code Status: FULL Family Communication: plan of care discussed with patient at bedside.  Status is: Inpatient  Remains inpatient appropriate because:Unsafe d/c plan, IV treatments appropriate due to intensity of illness or inability to take PO and Inpatient level of care appropriate due to severity of illness   Dispo: The patient is from: Home              Anticipated d/c is to: SNF but patient is reluctant wants to go home.              Anticipated d/c date is: 2 days              Patient currently is not medically stable to d/c.  Nutrition: Diet Order            DIET SOFT Room service appropriate? Yes; Fluid consistency: Thin  Diet effective now                       Body mass index is 60.46 kg/m.   Consultants:see note  Procedures:see note Microbiology:see note  Medications: Scheduled Meds: . aspirin EC  81 mg Oral Daily  . carvedilol  12.5 mg Oral BID WC  . Chlorhexidine Gluconate Cloth  6 each Topical Daily  . colchicine  0.3 mg Oral QODAY  . enoxaparin (LOVENOX) injection  60 mg Subcutaneous Q24H  . fluconazole  100 mg Oral Daily  . folic acid  1 mg Oral Daily  . hydrALAZINE  25 mg Oral Q8H  . magnesium oxide  400 mg Oral BID  . valACYclovir  1,000 mg Oral BID   Continuous Infusions: . sodium chloride 1,000 mL (03/05/20 0644)  .  ceFAZolin (ANCEF) IV 1 g (03/07/20 0536)    Antimicrobials: Anti-infectives (From admission, onward)   Start     Dose/Rate Route Frequency Ordered Stop   03/06/20 1500  fluconazole (DIFLUCAN) tablet 100 mg     Discontinue     100 mg Oral Daily 03/06/20 1406 03/11/20 0959   03/03/20 1400  ceFAZolin (ANCEF) IVPB 1 g/50 mL premix     Discontinue     1 g 100 mL/hr over 30 Minutes Intravenous Every 8 hours 03/03/20 1237     03/03/20 1115  valACYclovir (VALTREX) tablet 1,000 mg     Discontinue     1,000 mg Oral 2 times daily  03/03/20 1106         Objective: Vitals: Today's Vitals   03/06/20 2230 03/06/20 2259 03/07/20 0940 03/07/20 0947  BP:    (!) 133/58  Pulse:    90  Resp:      Temp:      TempSrc:      SpO2:      Weight:      Height:      PainSc: 10-Worst pain ever Asleep 9      Intake/Output Summary (Last 24 hours) at 03/07/2020 1241 Last data filed at 03/07/2020 0530 Gross per 24 hour  Intake 456.25 ml  Output 925 ml  Net -468.75 ml   Filed Weights   03/03/20 0942  Weight: (!) 145.2 kg   Weight change:    Intake/Output from previous day: 07/08 0701 - 07/09 0700 In: 456.3 [  Blood:456.3] Out: 925 [Urine:925] Intake/Output this shift: No intake/output data recorded.  Examination:  General exam: AAO x3, tearful,NAD, weak appearing. HEENT:Oral mucosa moist, Ear/Nose WNL grossly, dentition normal. Respiratory system: bilaterally clear,no wheezing or crackles,no use of accessory muscle Cardiovascular system: S1 & S2 +, No JVD,. Gastrointestinal system: Abdomen soft, tender in mid to epigastrium area, morbidly obese, abdominal skin wall/pannus with area of cellulitis and skin breakdown  Nervous System:Alert, awake, moving extremities and grossly nonfocal Extremities: Lateral lymphedema present , skin breakdown in inner thigh Skin: No rashes,no icterus. MSK: Normal muscle bulk,tone, power Foley in place.  Data Reviewed: I have personally reviewed following labs and imaging studies CBC: Recent Labs  Lab 03/03/20 1021 03/03/20 1021 03/04/20 1110 03/05/20 0500 03/06/20 0543 03/06/20 1900 03/07/20 0500  WBC 17.2*  --  17.6* 23.1* 22.0*  --  21.9*  NEUTROABS 13.7*  --  14.1* 18.2*  --   --   --   HGB 7.9*   < > 7.3* 7.3* 6.9* 8.1* 8.3*  HCT 23.9*   < > 22.5* 22.3* 21.6* 25.3* 25.7*  MCV 93.0  --  94.9 95.3 96.4  --  93.8  PLT 127*  --  161 164 180  --  195   < > = values in this interval not displayed.   Basic Metabolic Panel: Recent Labs  Lab 03/03/20 1021 03/04/20 1110  03/05/20 0500 03/06/20 0543 03/07/20 0500  NA 135 136 137 139 138  K 3.9 3.9 3.8 4.0 3.9  CL 101 102 105 107 104  CO2 21* 22 24 24 24   GLUCOSE 142* 120* 136* 108* 107*  BUN 32* 30* 29* 24* 21  CREATININE 1.61* 1.51* 1.50* 1.24* 1.27*  CALCIUM 8.2* 7.9* 8.4* 8.5* 8.5*  MG  --  1.3* 1.9 1.6*  --   PHOS  --  2.7 3.0  --   --    GFR: Estimated Creatinine Clearance: 61.3 mL/min (A) (by C-G formula based on SCr of 1.27 mg/dL (H)). Liver Function Tests: Recent Labs  Lab 03/03/20 1021 03/04/20 1110 03/05/20 0500 03/06/20 0543 03/07/20 0500  AST 15 11* 14* 10* 13*  ALT 15 13 11 8 8   ALKPHOS 52 54 67 65 82  BILITOT 0.3 0.6 0.6 0.5 0.9  PROT 5.8* 5.3* 5.5* 5.1* 5.6*  ALBUMIN 2.5* 2.3* 2.4* 2.1* 2.3*   No results for input(s): LIPASE, AMYLASE in the last 168 hours. No results for input(s): AMMONIA in the last 168 hours. Coagulation Profile: No results for input(s): INR, PROTIME in the last 168 hours. Cardiac Enzymes: No results for input(s): CKTOTAL, CKMB, CKMBINDEX, TROPONINI in the last 168 hours. BNP (last 3 results) No results for input(s): PROBNP in the last 8760 hours. HbA1C: No results for input(s): HGBA1C in the last 72 hours. CBG: No results for input(s): GLUCAP in the last 168 hours. Lipid Profile: No results for input(s): CHOL, HDL, LDLCALC, TRIG, CHOLHDL, LDLDIRECT in the last 72 hours. Thyroid Function Tests: No results for input(s): TSH, T4TOTAL, FREET4, T3FREE, THYROIDAB in the last 72 hours. Anemia Panel: Recent Labs    03/05/20 0500  VITAMINB12 2,310*  FOLATE 3.8*  FERRITIN 861*  TIBC 157*  IRON 52  RETICCTPCT 0.8   Sepsis Labs: Recent Labs  Lab 03/03/20 1120 03/03/20 1313 03/04/20 1111  LATICACIDVEN 2.7* 2.1* 1.0    Recent Results (from the past 240 hour(s))  Virus culture     Status: None   Collection Time: 03/03/20 10:21 AM   Specimen: Skin Scraping  Result Value Ref Range Status   Viral Culture Comment  Final    Comment:  (NOTE) Preliminary Report: No virus isolated at 24 hours. Next report to follow after 4 days. Performed At: [facility] [place], [place] [id number] Rush Farmer MD QH:[id number]    Source of Sample VAGINA  Final    Comment: Performed at [facility] [address] [place], [place] [address]  Culture, blood (routine x 2)     Status: None (Preliminary result)   Collection Time: 03/03/20 11:20 AM   Specimen: BLOOD  Result Value Ref Range Status   Specimen Description   Final    BLOOD RIGHT ANTECUBITAL Performed at [facility] [address] [place], [place] [address]    Special Requests   Final    BOTTLES DRAWN AEROBIC AND ANAEROBIC Blood Culture adequate volume Performed at [facility] [address] [place], [place] [address]    Culture   Final    NO GROWTH 3 DAYS Performed at [facility], [place] [address] [place], [place] [address]    Report Status PENDING  Incomplete  Culture, blood (routine x 2)     Status: None (Preliminary result)   Collection Time: 03/03/20 11:20 AM   Specimen: BLOOD  Result Value Ref Range Status   Specimen Description   Final    BLOOD PORTA CATH Performed at [facility] [address] [place], [place] [address]    Special Requests   Final    BOTTLES DRAWN AEROBIC AND ANAEROBIC Blood Culture adequate volume Performed at [facility] [address] [place], [place] [address]    Culture   Final    NO GROWTH 3 DAYS Performed at [facility], [place] [address] [place], [place] [address]    Report Status PENDING  Incomplete  Urine culture     Status: Abnormal   Collection Time: 03/03/20 11:20 AM   Specimen: Urine, Clean Catch  Result Value Ref Range Status   Specimen Description   Final    URINE, CLEAN CATCH Performed at [facility], [place] [address] [place], [place] [address]    Special  Requests   Final    NONE Performed at [facility], [place] [address], [place],  [address]    Culture (A)  Final    <10,000 COLONIES/mL INSIGNIFICANT GROWTH Performed at [facility] [address] [place],  [address]    Report Status 03/04/2020 FINAL  Final  SARS Coronavirus 2 by RT PCR (hospital order, performed in [facility] hospital lab) Nasopharyngeal Nasopharyngeal Swab     Status: None   Collection Time: 03/03/20 11:20 AM   Specimen: Nasopharyngeal Swab  Result Value Ref Range Status   SARS Coronavirus 2 NEGATIVE NEGATIVE Final    Comment: (NOTE) SARS-CoV-2 target nucleic acids are NOT DETECTED.  The SARS-CoV-2 RNA is generally detectable in upper and lower respiratory specimens during the acute phase of infection. The lowest concentration of SARS-CoV-2 viral copies this assay can detect is 250 copies / mL. A negative result does not preclude SARS-CoV-2 infection and should not be used as the sole basis for treatment or other patient management decisions.  A negative result may occur with improper specimen collection / handling, submission of specimen other than nasopharyngeal swab, presence of viral mutation(s) within the areas targeted by this assay, and inadequate number of viral copies (<250 copies /  mL). A negative result must be combined with clinical observations, patient history, and epidemiological information.  Fact Sheet for Patients:   StrictlyIdeas.no  Fact Sheet for Healthcare Providers: BankingDealers.co.za  This test is not yet approved or  cleared by the [place] FDA and has been authorized for detection and/or diagnosis of SARS-CoV-2 by FDA under an Emergency Use Authorization (EUA).  This EUA will remain in effect (meaning this test can be used) for the duration of the COVID-19 declaration under Section 564(b)(1) of the Act, 21 U.S.C. section 360bbb-3(b)(1),  unless the authorization is terminated or revoked sooner.  Performed at [facility], [place] [address] [place], [place] [address]       Radiology Studies: No results found.   LOS: 4 days   Antonieta Pert, MD [facility]  03/07/2020, 12:41 PM

## 2020-03-07 NOTE — NC FL2 (Signed)
[place] LEVEL OF CARE SCREENING TOOL     IDENTIFICATION  Patient Name: [name] Birthdate: [birth year] Sex: female Admission Date (Current Location): 03/03/2020  [facility] and [place] Number:  Herbalist and Address:  [facility],  [place] [address] [place]      Provider Number: [id number]  Attending Physician Name and Address:  Antonieta Pert, MD  Relative Name and Phone Number:       Current Level of Care: Hospital Recommended Level of Care: [place] Prior Approval Number:    Date Approved/Denied:   PASRR Number:    Discharge Plan: SNF    Current Diagnoses: Patient Active Problem List   Diagnosis Date Noted   Gout 03/03/2020   Skin breakdown 03/03/2020   Skin ulcer of perineum, limited to breakdown of skin ([place]) 03/03/2020   Leukocytosis 03/03/2020   Anemia associated with chemotherapy 03/03/2020   Thrombocytopenia ([place]) 03/03/2020   Port-A-Cath in place 02/19/2020   Acute renal failure (ARF) ([place]) 01/22/2020   S/P left mastectomy 12/19/2019   Genetic testing 11/30/2019   Morbid obesity with BMI of 60.0-69.9, adult ([place]) 11/21/2019   Family history of ovarian cancer    Malignant neoplasm of upper-outer quadrant of left breast in female, estrogen receptor positive ([place]) 11/14/2019   Fracture of radial head, right, closed 06/25/2019   Frequent falls 01/10/2019   Bilateral bunions 01/10/2019   Toenail fungus 50/93/2671   Alcoholic hepatitis 24/58/0998   Edema 11/20/2018   Alcohol-induced mood disorder ([place]) 11/01/2013   Alcohol dependence ([place]) 11/01/2013   Essential hypertension 06/20/2007   LOW BACK PAIN 06/20/2007   DIVERTICULOSIS, COLON 05/05/2007    Orientation RESPIRATION BLADDER Height & Weight     Self, Time, Situation, Place  Normal Incontinent, Indwelling catheter Weight: (!) 145.2 kg Height:  5\' 1"  (154.9 cm)  BEHAVIORAL SYMPTOMS/MOOD NEUROLOGICAL BOWEL  NUTRITION STATUS      Continent Diet (Soft diet)  AMBULATORY STATUS COMMUNICATION OF NEEDS Skin   Limited Assist Verbally Other (Comment) (Moisture associated skin damage to abdomen and groin. Cream applied)                       Personal Care Assistance Level of Assistance  Bathing, Dressing Bathing Assistance: Limited assistance   Dressing Assistance: Limited assistance     Functional Limitations Info  Sight Sight Info: Impaired        SPECIAL CARE FACTORS FREQUENCY  PT (By licensed PT), OT (By licensed OT)     PT Frequency: 3 x weekly OT Frequency: 3 x weekly            Contractures Contractures Info: Not present    Additional Factors Info  Code Status, Allergies Code Status Info: Full Allergies Info: Amlodipine           Current Medications (03/07/2020):  This is the current hospital active medication list Current Facility-Administered Medications  Medication Dose Route Frequency Provider Last Rate Last Admin   0.9 %  sodium chloride infusion   Intravenous PRN Donnamae Jude, MD 10 mL/hr at 03/05/20 0644 1,000 mL at 03/05/20 0644   acetaminophen (TYLENOL) tablet 650 mg  650 mg Oral Q6H PRN Donnamae Jude, MD   650 mg at 03/07/20 0959   Or   acetaminophen (TYLENOL) suppository 650 mg  650 mg Rectal Q6H PRN Donnamae Jude, MD       aspirin EC tablet 81 mg  81 mg Oral Daily  Donnamae Jude, MD   81 mg at 03/07/20 0959   carvedilol (COREG) tablet 12.5 mg  12.5 mg Oral BID WC Kc, Ramesh, MD   12.5 mg at 03/07/20 0959   ceFAZolin (ANCEF) IVPB 1 g/50 mL premix  1 g Intravenous Q8H Donnamae Jude, MD 100 mL/hr at 03/07/20 0536 1 g at 03/07/20 0536   Chlorhexidine Gluconate Cloth 2 % PADS 6 each  6 each Topical Daily Raiford Noble [place], DO   6 each at 03/06/20 1119   colchicine tablet 0.3 mg  0.3 mg Oral Newman Nip, MD   0.3 mg at 03/06/20 1118   diphenhydrAMINE (BENADRYL) capsule 25 mg  25 mg Oral Q6H PRN Raiford Noble Latif, DO   25 mg at  03/06/20 0809   enoxaparin (LOVENOX) injection 60 mg  60 mg Subcutaneous Q24H Sheikh, Omair Navarino, DO   60 mg at 03/06/20 2121   fluconazole (DIFLUCAN) tablet 100 mg  100 mg Oral Daily Kc, Maren [place], MD   100 mg at 54/65/03 5465   folic acid (FOLVITE) tablet 1 mg  1 mg Oral Daily Kc, Ramesh, MD   1 mg at 03/07/20 0959   hydrALAZINE (APRESOLINE) tablet 25 mg  25 mg Oral Q8H Kc, Ramesh, MD   25 mg at 03/07/20 0529   HYDROmorphone (DILAUDID) injection 0.5-1 mg  0.5-1 mg Intravenous Q2H PRN Donnamae Jude, MD   1 mg at 03/07/20 0955   hydrOXYzine (ATARAX/VISTARIL) tablet 10 mg  10 mg Oral TID PRN Antonieta Pert, MD   10 mg at 03/06/20 1654   iohexol (OMNIPAQUE) 9 MG/ML oral solution            magic mouthwash  5 mL Oral QID PRN Donnamae Jude, MD       magnesium oxide (MAG-OX) tablet 400 mg  400 mg Oral BID Kc, Ramesh, MD   400 mg at 03/07/20 0959   metoprolol tartrate (LOPRESSOR) injection 5 mg  5 mg Intravenous Q6H PRN Donnamae Jude, MD       ondansetron [facility]) tablet 4 mg  4 mg Oral Q8H PRN Donnamae Jude, MD   4 mg at 03/06/20 0025   oxyCODONE-acetaminophen (PERCOCET/ROXICET) 5-325 MG per tablet 1 tablet  1 tablet Oral Q6H PRN Antonieta Pert, MD   1 tablet at 03/06/20 1438   polyethylene glycol (MIRALAX / GLYCOLAX) packet 17 g  17 g Oral Daily PRN Donnamae Jude, MD       valACYclovir (VALTREX) tablet 1,000 mg  1,000 mg Oral BID Donnamae Jude, MD   1,000 mg at 03/07/20 1015     Discharge Medications: Please see discharge summary for a list of discharge medications.  Relevant Imaging Results:  Relevant Lab Results:   Additional Information [id number]  Madelene Kaatz, Marjie Skiff, RN

## 2020-03-08 DIAGNOSIS — T451X5A Adverse effect of antineoplastic and immunosuppressive drugs, initial encounter: Secondary | ICD-10-CM | POA: Diagnosis not present

## 2020-03-08 DIAGNOSIS — R238 Other skin changes: Secondary | ICD-10-CM | POA: Diagnosis not present

## 2020-03-08 DIAGNOSIS — D72829 Elevated white blood cell count, unspecified: Secondary | ICD-10-CM | POA: Diagnosis not present

## 2020-03-08 DIAGNOSIS — I10 Essential (primary) hypertension: Secondary | ICD-10-CM | POA: Diagnosis not present

## 2020-03-08 DIAGNOSIS — N179 Acute kidney failure, unspecified: Secondary | ICD-10-CM | POA: Diagnosis not present

## 2020-03-08 DIAGNOSIS — D6481 Anemia due to antineoplastic chemotherapy: Secondary | ICD-10-CM | POA: Diagnosis not present

## 2020-03-08 LAB — CULTURE, BLOOD (ROUTINE X 2)
Culture: NO GROWTH
Culture: NO GROWTH
Special Requests: ADEQUATE
Special Requests: ADEQUATE

## 2020-03-08 LAB — COMPREHENSIVE METABOLIC PANEL
ALT: 7 U/L (ref 0–44)
AST: 16 U/L (ref 15–41)
Albumin: 2.1 g/dL — ABNORMAL LOW (ref 3.5–5.0)
Alkaline Phosphatase: 72 U/L (ref 38–126)
Anion gap: 7 (ref 5–15)
BUN: 17 mg/dL (ref 8–23)
CO2: 23 mmol/L (ref 22–32)
Calcium: 7.9 mg/dL — ABNORMAL LOW (ref 8.9–10.3)
Chloride: 103 mmol/L (ref 98–111)
Creatinine, Ser: 1.02 mg/dL — ABNORMAL HIGH (ref 0.44–1.00)
GFR calc Af Amer: >60 mL/min (ref >60)
GFR calc non Af Amer: 58 mL/min — ABNORMAL LOW (ref >60)
Glucose, Bld: 95 mg/dL (ref 70–99)
Potassium: 3.5 mmol/L (ref 3.5–5.1)
Sodium: 133 mmol/L — ABNORMAL LOW (ref 135–145)
Total Bilirubin: 0.6 mg/dL (ref 0.3–1.2)
Total Protein: 5.1 g/dL — ABNORMAL LOW (ref 6.5–8.1)

## 2020-03-08 LAB — CBC
HCT: 23.8 % — ABNORMAL LOW (ref 36.0–46.0)
Hemoglobin: 7.7 g/dL — ABNORMAL LOW (ref 12.0–15.0)
MCH: 30.4 pg (ref 26.0–34.0)
MCHC: 32.4 g/dL (ref 30.0–36.0)
MCV: 94.1 fL (ref 80.0–100.0)
Platelets: 210 10*3/uL (ref 150–400)
RBC: 2.53 MIL/uL — ABNORMAL LOW (ref 3.87–5.11)
RDW: 16.3 % — ABNORMAL HIGH (ref 11.5–15.5)
WBC: 19.8 10*3/uL — ABNORMAL HIGH (ref 4.0–10.5)
nRBC: 0.1 % (ref 0.0–0.2)

## 2020-03-08 NOTE — Plan of Care (Signed)
Plan of care reviewed and discussed with the patient. 

## 2020-03-08 NOTE — Progress Notes (Signed)
PROGRESS NOTE    [name]  VVO:[id number] DOB: 1956/03/13 DOA: 03/03/2020 PCP: [name], MD   Chef Complaints: Skin breakdown  Brief Narrative: 64 YO female with breast cancer status postmastectomy in April 2021 with negative margins undergoing adjuvant chemo Adriamycin plus cyclophosphamide last dose last week prior to admission, morbidly obese BMI 60, poorly mobile, lymphedema/venous insufficiency/CKD, hypertension recent admission for the inner thighs and perineum, who lives alone and a family member comes in to take care of her, has been incontinent with urine at all times noted to have a significant skin breakdown in the shower, sent to the ED for evaluation  In the ED found to have a lot of skin breakdown maceration several vesicles and also significant leukocytosis anemia, thrombocytopenia elevated BUN/creatinine of 1.6 up from 1.3 on June/30.  Foley catheter was placed, patient was admitted for IV antibiotics IV fluids hydration Patient is followed by wound care, wound is healing well.  She is on IV antibiotics. She remains deconditioned weak seen by PT and SNF has been advised initially reluctant however after discussion with her oncologist she has agreed to go to skilled nursing rehab.  Subjective:  Patient reports he feels better today no abdominal pain no nausea vomiting or diarrhea.    Assessment & Plan:  Multiple skin breakdown cellulitis in the inner thighs and perineum abdominal pannus area with wound in different stages: Multifactorial with poor mobility/morbid obesity, incontinence of urine limited care at home.  Initial lactic acidosis resolved.  She had leukocytosis likely from combination of Neulasta and now downtrending.  Continue wound care, continue IV Ancef for now can switch to Keflex on discharge  Acute abdominal pain mid abdomen/epigastrium: Likely from gastritis, CT abdomen with equivocal mild wall thickening of the distal descending and proximal sigmoid  colon but patient without any diarrhea.  Pain is resolved.  Continue symptomatic management.   Leukocytosis: Likely combination of Neulasta and also with infection.  Culture no growth so far.  Oral thrush continue Diflucan x5 days.  Essential hypertension: BP well controlled on Coreg and hydralazine.   AKI: Creatinine 0.8 at baseline, peaked to 1.2 and now improved to 1.0.Kept on gentle IV fluids 2/2 CT scan.  Metabolic acidosis: Improved.  Folate deficiency/anemia of chronic disease hemoglobin 8.5: On admission 7.9 g and has decreased to 6.9, suspect multifactorial in the setting of cancer, folate deficiency.  Patient received 1 unit PRBC transfusion hemoglobin has improved in mid 7-8 g range.  Low folic acid at 3.8 on replacement.  Ferritin is stable B12 normal.  CBC in a.m.  Deconditioning/poor mobility/malaise weakness: Awaiting on a skilled nursing facility placement.   T2 N1 stage IIb grade 3 ER positive breast cancer followed by medical oncology chemotherapy session 7/6 has been posted for 7/13 as patient did not go to outpatient follow-up instead came to the ED.  I have notified her oncology and has seen the patient planning for outpatient follow-up/chemo regimen   Gout continue colchicine.  Thrombocytopenia platelet counts are stable and normal . Hypomagnesemia: At 1.6, add magnesium oxide x 7 days.   Super morbid obesity with BMI 60.  Will benefit weight loss at the lifestyle  Pressure ulcer present on admission on buttock as below: We will continue wound care 03/03/20 2308  Location: Buttocks  Location Orientation: Posterior  Staging: Stage 3 -  Full thickness tissue loss. Subcutaneous fat may be visible but bone, tendon or muscle are NOT exposed.  Wound Description (Comments):   Present on Admission: Yes  Pressure Injury 03/03/20 Buttocks Stage 3 -  Full thickness tissue loss. Subcutaneous fat may be visible but bone, tendon or muscle are NOT exposed. (Active)    03/03/20 2308  Location: Buttocks  Location Orientation:   Staging: Stage 3 -  Full thickness tissue loss. Subcutaneous fat may be visible but bone, tendon or muscle are NOT exposed.  Wound Description (Comments):   Present on Admission: Yes   DVT prophylaxis: lovenox  Code Status: FULL Family Communication: plan of care discussed with patient at bedside.  Status is: Inpatient  Remains inpatient appropriate because:Unsafe d/c plan, IV treatments appropriate due to intensity of illness or inability to take PO and Inpatient level of care appropriate due to severity of illness   Dispo: The patient is from: Home              Anticipated d/c is to: SNF               Anticipated d/c date is: Once SNF approval is received              Patient currently is medically stable for discharge  Nutrition: Diet Order            DIET SOFT Room service appropriate? Yes; Fluid consistency: Thin  Diet effective now                       Body mass index is 60.46 kg/m.   Consultants:see note  Procedures:see note Microbiology:see note  Medications: Scheduled Meds:  aspirin EC  81 mg Oral Daily   carvedilol  12.5 mg Oral BID WC   Chlorhexidine Gluconate Cloth  6 each Topical Daily   colchicine  0.3 mg Oral QODAY   enoxaparin (LOVENOX) injection  60 mg Subcutaneous Q24H   fluconazole  100 mg Oral Daily   folic acid  1 mg Oral Daily   hydrALAZINE  25 mg Oral Q8H   magnesium oxide  400 mg Oral BID   valACYclovir  1,000 mg Oral BID   Continuous Infusions:  sodium chloride 1,000 mL (03/05/20 0644)    ceFAZolin (ANCEF) IV 1 g (03/08/20 0542)    Antimicrobials: Anti-infectives (From admission, onward)   Start     Dose/Rate Route Frequency Ordered Stop   03/06/20 1500  fluconazole (DIFLUCAN) tablet 100 mg     Discontinue     100 mg Oral Daily 03/06/20 1406 03/11/20 0959   03/03/20 1400  ceFAZolin (ANCEF) IVPB 1 g/50 mL premix     Discontinue     1 g 100 mL/hr over 30  Minutes Intravenous Every 8 hours 03/03/20 1237     03/03/20 1115  valACYclovir (VALTREX) tablet 1,000 mg     Discontinue     1,000 mg Oral 2 times daily 03/03/20 1106         Objective: Vitals: Today's Vitals   03/08/20 0615 03/08/20 0832 03/08/20 1301 03/08/20 1320  BP:   122/68 125/70  Pulse:    91  Resp:    18  Temp:    (!) 97.5 F (36.4 C)  TempSrc:    Oral  SpO2:    99%  Weight:      Height:      PainSc: Asleep 0-No pain      Intake/Output Summary (Last 24 hours) at 03/08/2020 1350 Last data filed at 03/08/2020 0620 Gross per 24 hour  Intake 978.22 ml  Output 1800 ml  Net -821.78 ml   [employer]  03/03/20 0942  Weight: (!) 145.2 kg   Weight change:    Intake/Output from previous day: 07/09 0701 - 07/10 0700 In: 978.2 [I.V.:878.2; IV Piggyback:100] Out: 1800 [Urine:1800] Intake/Output this shift: No intake/output data recorded.  Examination:  General exam: AAOx3, obese,NAD, weak appearing. HEENT:Oral mucosa moist, Ear/Nose WNL grossly, dentition normal. Respiratory system: bilaterally clear,no wheezing or crackles,no use of accessory muscle Cardiovascular system: S1 & S2 +, No JVD,. Gastrointestinal system: Abdomen soft, obesity present with anterior abdominal wall skin fold/pannus with area of skin breakdown/wound. Nervous System:Alert, awake, moving extremities and grossly nonfocal Extremities: Bilateral lymphedema on legs, distal peripheral pulses palpable.  Skin: No rashes,no icterus. Inner thigh sacral area with skin breakdown MSK: Normal muscle bulk,tone, power  Data Reviewed: I have personally reviewed following labs and imaging studies CBC: Recent Labs  Lab 03/03/20 1021 03/03/20 1021 03/04/20 1110 03/04/20 1110 03/05/20 0500 03/06/20 0543 03/06/20 1900 03/07/20 0500 03/08/20 0557  WBC 17.2*   < > 17.6*  --  23.1* 22.0*  --  21.9* 19.8*  NEUTROABS 13.7*  --  14.1*  --  18.2*  --   --   --   --   HGB 7.9*   < > 7.3*   < > 7.3* 6.9*  8.1* 8.3* 7.7*  HCT 23.9*   < > 22.5*   < > 22.3* 21.6* 25.3* 25.7* 23.8*  MCV 93.0   < > 94.9  --  95.3 96.4  --  93.8 94.1  PLT 127*   < > 161  --  164 180  --  195 210   < > = values in this interval not displayed.   Basic Metabolic Panel: Recent Labs  Lab 03/04/20 1110 03/05/20 0500 03/06/20 0543 03/07/20 0500 03/08/20 0557  NA 136 137 139 138 133*  K 3.9 3.8 4.0 3.9 3.5  CL 102 105 107 104 103  CO2 22 24 24 24 23   GLUCOSE 120* 136* 108* 107* 95  BUN 30* 29* 24* 21 17  CREATININE 1.51* 1.50* 1.24* 1.27* 1.02*  CALCIUM 7.9* 8.4* 8.5* 8.5* 7.9*  MG 1.3* 1.9 1.6*  --   --   PHOS 2.7 3.0  --   --   --    GFR: Estimated Creatinine Clearance: 76.4 mL/min (A) (by C-G formula based on SCr of 1.02 mg/dL (H)). Liver Function Tests: Recent Labs  Lab 03/04/20 1110 03/05/20 0500 03/06/20 0543 03/07/20 0500 03/08/20 0557  AST 11* 14* 10* 13* 16  ALT 13 11 8 8 7   ALKPHOS 54 67 65 82 72  BILITOT 0.6 0.6 0.5 0.9 0.6  PROT 5.3* 5.5* 5.1* 5.6* 5.1*  ALBUMIN 2.3* 2.4* 2.1* 2.3* 2.1*   No results for input(s): LIPASE, AMYLASE in the last 168 hours. No results for input(s): AMMONIA in the last 168 hours. Coagulation Profile: No results for input(s): INR, PROTIME in the last 168 hours. Cardiac Enzymes: No results for input(s): CKTOTAL, CKMB, CKMBINDEX, TROPONINI in the last 168 hours. BNP (last 3 results) No results for input(s): PROBNP in the last 8760 hours. HbA1C: No results for input(s): HGBA1C in the last 72 hours. CBG: No results for input(s): GLUCAP in the last 168 hours. Lipid Profile: No results for input(s): CHOL, HDL, LDLCALC, TRIG, CHOLHDL, LDLDIRECT in the last 72 hours. Thyroid Function Tests: No results for input(s): TSH, T4TOTAL, FREET4, T3FREE, THYROIDAB in the last 72 hours. Anemia Panel: No results for input(s): VITAMINB12, FOLATE, FERRITIN, TIBC, IRON, RETICCTPCT in the last 72 hours. Sepsis Labs:  Recent Labs  Lab 03/03/20 1120 03/03/20 1313  03/04/20 1111  LATICACIDVEN 2.7* 2.1* 1.0    Recent Results (from the past 240 hour(s))  Virus culture     Status: None   Collection Time: 03/03/20 10:21 AM   Specimen: Skin Scraping  Result Value Ref Range Status   Viral Culture Comment  Final    Comment: (NOTE) Preliminary Report: No virus isolated at 4 days.  Next report to follow after 7 days. Performed At: [facility] [place], [place] [id number] Rush Farmer MD SE:[id number]    Source of Sample VAGINA  Final    Comment: Performed at [facility] [address], [place], [place] [address]  Culture, blood (routine x 2)     Status: None (Preliminary result)   Collection Time: 03/03/20 11:20 AM   Specimen: BLOOD  Result Value Ref Range Status   Specimen Description   Final    BLOOD RIGHT ANTECUBITAL Performed at [facility] [address], [place], [place] [address]    Special Requests   Final    BOTTLES DRAWN AEROBIC AND ANAEROBIC Blood Culture adequate volume Performed at [facility] [address], [place], [place] [address]    Culture   Final    NO GROWTH 4 DAYS Performed at [facility], [place] [address] [place], [place] [address]    Report Status PENDING  Incomplete  Culture, blood (routine x 2)     Status: None (Preliminary result)   Collection Time: 03/03/20 11:20 AM   Specimen: BLOOD  Result Value Ref Range Status   Specimen Description   Final    BLOOD PORTA CATH Performed at [facility] [address] [place], [place] [address]    Special Requests   Final    BOTTLES DRAWN AEROBIC AND ANAEROBIC Blood Culture adequate volume Performed at [facility] [address], [place], [place] [address]    Culture   Final    NO GROWTH 4 DAYS Performed at [facility], [place] [address] [place], [place] [address]    Report Status PENDING  Incomplete  Urine culture     Status:  Abnormal   Collection Time: 03/03/20 11:20 AM   Specimen: Urine, Clean Catch  Result Value Ref Range Status   Specimen Description   Final    URINE, CLEAN CATCH Performed at [facility], [place] [address] [place], [place] [address]    Special Requests   Final    NONE Performed at [facility], [place] [address], [place], [place] [address]    Culture (A)  Final    <10,000 COLONIES/mL INSIGNIFICANT GROWTH Performed at [facility] [address] [place], [place] [address]    Report Status 03/04/2020 FINAL  Final  SARS Coronavirus 2 by RT PCR (hospital order, performed in [facility] hospital lab) Nasopharyngeal Nasopharyngeal Swab     Status: None   Collection Time: 03/03/20 11:20 AM   Specimen: Nasopharyngeal Swab  Result Value Ref Range Status   SARS Coronavirus 2 NEGATIVE NEGATIVE Final    Comment: (NOTE) SARS-CoV-2 target nucleic acids are NOT DETECTED.  The SARS-CoV-2 RNA is generally detectable in upper and lower respiratory specimens during the acute phase of infection. The lowest concentration of SARS-CoV-2 viral copies this assay can detect is 250 copies / mL. A negative result does not preclude SARS-CoV-2 infection and should not be used as the  sole basis for treatment or other patient management decisions.  A negative result may occur with improper specimen collection / handling, submission of specimen other than nasopharyngeal swab, presence of viral mutation(s) within the areas targeted by this assay, and inadequate number of viral copies (<250 copies / mL). A negative result must be combined with clinical observations, patient history, and epidemiological information.  Fact Sheet for Patients:   StrictlyIdeas.no  Fact Sheet for Healthcare Providers: BankingDealers.co.za  This test is not yet approved or  cleared by the [place] FDA and has been authorized for detection  and/or diagnosis of SARS-CoV-2 by FDA under an Emergency Use Authorization (EUA).  This EUA will remain in effect (meaning this test can be used) for the duration of the COVID-19 declaration under Section 564(b)(1) of the Act, 21 U.S.C. section 360bbb-3(b)(1), unless the authorization is terminated or revoked sooner.  Performed at [facility], [place] [address], [place], [place] [address]       Radiology Studies: CT ABDOMEN PELVIS W CONTRAST  Result Date: 03/07/2020 CLINICAL DATA:  Generalized abdominal pain. Ongoing diarrhea. History of left breast cancer with current chemotherapy. EXAM: CT ABDOMEN AND PELVIS WITH CONTRAST TECHNIQUE: Multidetector CT imaging of the abdomen and pelvis was performed using the standard protocol following bolus administration of intravenous contrast. CONTRAST:  151mL OMNIPAQUE IOHEXOL 300 MG/ML  SOLN COMPARISON:  CT 07/14/2012. Renal ultrasound 01/22/2020 FINDINGS: Lower chest: No focal airspace disease or pleural effusion. There are coronary artery calcifications. Hepatobiliary: Prominent liver spanning 18 cm cranial caudal. Decreased hepatic steatosis from prior exam. Questionable nodular hepatic contours. Decompressed gallbladder without gallstones or pericholecystic inflammation. Pancreas: No ductal dilatation or inflammation. Spleen: Normal in size without focal abnormality. Splenule anteriorly unchanged from prior. Adrenals/Urinary Tract: No adrenal nodule. There is no hydronephrosis. Symmetric bilateral perinephric edema. There are nonobstructing stones in the right kidney, largest measuring 7 mm. Parenchymal calcification in the lower left kidney. There is symmetric renal excretion on 6 minutes delay scan. Partially distended urinary bladder. Air-fluid level in the bladder, Foley catheter in place. Equivocal bladder wall thickening. Stomach/Bowel: Decompressed stomach. No small bowel obstruction or inflammation. Administered enteric contrast  reaches the colon. Normal terminal ileum. No appendicitis, the appendix is not well characterized. Enteric contrast reaches the distal colon. Equivocal mild wall thickening of the distal descending and proximal sigmoid colon but no pericolonic inflammation. This is in the region of multiple diverticula. There is no acute diverticulitis. No abnormal rectal distention. Vascular/Lymphatic: Minor aortic atherosclerosis. Patent portal vein. Few lower retroperitoneal nodes that are not enlarged by size criteria. Reproductive: Uterus and bilateral adnexa are unremarkable. Other: No free air or ascites. Subcutaneous edema in the bilateral flank soft tissues. Musculoskeletal: Diffuse degenerative change throughout the spine. Mild anterior wedging of superior endplate of L3 and L4 vertebra, age indeterminate but new from 2019. There are no acute or suspicious osseous abnormalities. IMPRESSION: 1. Equivocal mild wall thickening of the distal descending and proximal sigmoid colon but no pericolonic inflammation. An element of colitis is considered in the setting of diarrhea. This is in the region of multiple diverticula, but no focal diverticulum. 2. Nonobstructing right nephrolithiasis. 3. Questionable nodular hepatic contours, can be seen with cirrhosis. Recommend correlation with cirrhosis risk factors. 4. Mild anterior wedging of superior endplate of L3 and L4 vertebra, age indeterminate but new from 2013. Aortic Atherosclerosis (ICD10-I70.0). Electronically Signed   By: Keith Rake M.D.   On: 03/07/2020 17:21     LOS: 5 days  Antonieta Pert, MD [facility]  03/08/2020, 1:50 PM

## 2020-03-09 DIAGNOSIS — R238 Other skin changes: Secondary | ICD-10-CM | POA: Diagnosis not present

## 2020-03-09 DIAGNOSIS — N179 Acute kidney failure, unspecified: Secondary | ICD-10-CM | POA: Diagnosis not present

## 2020-03-09 LAB — CBC
HCT: 25.2 % — ABNORMAL LOW (ref 36.0–46.0)
Hemoglobin: 8.2 g/dL — ABNORMAL LOW (ref 12.0–15.0)
MCH: 30.6 pg (ref 26.0–34.0)
MCHC: 32.5 g/dL (ref 30.0–36.0)
MCV: 94 fL (ref 80.0–100.0)
Platelets: 235 10*3/uL (ref 150–400)
RBC: 2.68 MIL/uL — ABNORMAL LOW (ref 3.87–5.11)
RDW: 16.2 % — ABNORMAL HIGH (ref 11.5–15.5)
WBC: 21.9 10*3/uL — ABNORMAL HIGH (ref 4.0–10.5)
nRBC: 0.1 % (ref 0.0–0.2)

## 2020-03-09 LAB — BASIC METABOLIC PANEL
Anion gap: 7 (ref 5–15)
BUN: 14 mg/dL (ref 8–23)
CO2: 26 mmol/L (ref 22–32)
Calcium: 8.2 mg/dL — ABNORMAL LOW (ref 8.9–10.3)
Chloride: 104 mmol/L (ref 98–111)
Creatinine, Ser: 0.97 mg/dL (ref 0.44–1.00)
GFR calc Af Amer: >60 mL/min (ref >60)
GFR calc non Af Amer: >60 mL/min (ref >60)
Glucose, Bld: 96 mg/dL (ref 70–99)
Potassium: 3.8 mmol/L (ref 3.5–5.1)
Sodium: 137 mmol/L (ref 135–145)

## 2020-03-09 NOTE — Progress Notes (Signed)
PROGRESS NOTE    [name]  KDX:[id number] DOB: 1955-10-15 DOA: 03/03/2020 PCP: [name], MD   Chef Complaints: Skin breakdown  Brief Narrative: 64 YO female with breast cancer status postmastectomy in April 2021 with negative margins undergoing adjuvant chemo Adriamycin plus cyclophosphamide last dose last week prior to admission, morbidly obese BMI 60, poorly mobile, lymphedema/venous insufficiency/CKD, hypertension recent admission for the inner thighs and perineum, who lives alone and a family member comes in to take care of her, has been incontinent with urine at all times noted to have a significant skin breakdown in the shower, sent to the ED for evaluation  In the ED found to have a lot of skin breakdown maceration several vesicles and also significant leukocytosis anemia, thrombocytopenia elevated BUN/creatinine of 1.6 up from 1.3 on June/30.  Foley catheter was placed, patient was admitted for IV antibiotics IV fluids hydration Patient is followed by wound care, wound is healing well.  She is on IV antibiotics. She remains deconditioned weak seen by PT and SNF has been advised initially reluctant however after discussion with her oncologist she has agreed to go to skilled nursing rehab.  Subjective:  She has no complaints she notes she is waiting for insurance authorization to go to SNF she thinks she is going to SNF on Monday.  Assessment & Plan:  Multiple skin breakdown cellulitis in the inner thighs and perineum abdominal pannus area with wound in different stages: Multifactorial with poor mobility/morbid obesity, incontinence of urine limited care at home.  Initial lactic acidosis resolved.  She had leukocytosis likely from combination of Neulasta and now downtrending.  Continue wound care, continue IV Ancef for now can switch to Keflex on discharge  Acute abdominal pain mid abdomen/epigastrium: Likely from gastritis, CT abdomen with equivocal mild wall thickening of the  distal descending and proximal sigmoid colon but patient without any diarrhea.  Pain is resolved.  Continue symptomatic management.   Leukocytosis: Likely combination of Neulasta and also with infection.  Culture no growth so far.  White count 21.9.  Oral thrush continue Diflucan x5 days.  Essential hypertension: Blood pressure 126/71.  Continue Coreg and hydralazine.  AKI: Creatinine 0.97.  Resolved with fluids..  Metabolic acidosis: Improved.  Folate deficiency/anemia of chronic disease hemoglobin 8.5: On admission 7.9 g and has decreased to 6.9, suspect multifactorial in the setting of cancer, folate deficiency.  Patient received 1 unit PRBC transfusion hemoglobin has improved in mid 7-8 g range.  Low folic acid at 3.8 on replacement.  Ferritin is stable B12 normal.    Deconditioning/poor mobility/malaise weakness: Awaiting on a skilled nursing facility placement.   T2 N1 stage IIb grade 3 ER positive breast cancer followed by medical oncology chemotherapy session 7/6 has been posted for 7/13 as patient did not go to outpatient follow-up instead came to the ED.  I have notified her oncology and has seen the patient planning for outpatient follow-up/chemo regimen   Gout continue colchicine.  Thrombocytopenia platelet counts are stable and normal . Hypomagnesemia: At 1.6, add magnesium oxide x 7 days.   Super morbid obesity with BMI 60.  Will benefit weight loss at the lifestyle  Pressure ulcer present on admission on buttock as below: We will continue wound care 03/03/20 2308  Location: Buttocks  Location Orientation: Posterior  Staging: Stage 3 -  Full thickness tissue loss. Subcutaneous fat may be visible but bone, tendon or muscle are NOT exposed.  Wound Description (Comments):   Present on Admission: Yes  Pressure Injury 03/03/20 Buttocks Stage 3 -  Full thickness tissue loss. Subcutaneous fat may be visible but bone, tendon or muscle are NOT exposed. (Active)  03/03/20  2308  Location: Buttocks  Location Orientation:   Staging: Stage 3 -  Full thickness tissue loss. Subcutaneous fat may be visible but bone, tendon or muscle are NOT exposed.  Wound Description (Comments):   Present on Admission: Yes   DVT prophylaxis: lovenox  Code Status: FULL Family Communication: plan of care discussed with patient at bedside.  Status is: Inpatient  Remains inpatient appropriate because:Unsafe d/c plan, IV treatments appropriate due to intensity of illness or inability to take PO and Inpatient level of care appropriate due to severity of illness   Dispo: The patient is from: Home              Anticipated d/c is to: SNF               Anticipated d/c date is: Once SNF approval is received              Patient currently is medically stable for discharge  Nutrition: Diet Order            DIET SOFT Room service appropriate? Yes; Fluid consistency: Thin  Diet effective now                       Body mass index is 60.46 kg/m.   Consultants:see note  Procedures:see note Microbiology:see note  Medications: Scheduled Meds:  aspirin EC  81 mg Oral Daily   carvedilol  12.5 mg Oral BID WC   Chlorhexidine Gluconate Cloth  6 each Topical Daily   colchicine  0.3 mg Oral QODAY   enoxaparin (LOVENOX) injection  60 mg Subcutaneous Q24H   fluconazole  100 mg Oral Daily   folic acid  1 mg Oral Daily   hydrALAZINE  25 mg Oral Q8H   magnesium oxide  400 mg Oral BID   valACYclovir  1,000 mg Oral BID   Continuous Infusions:  sodium chloride 1,000 mL (03/05/20 0644)    ceFAZolin (ANCEF) IV 1 g (03/09/20 0516)    Antimicrobials: Anti-infectives (From admission, onward)   Start     Dose/Rate Route Frequency Ordered Stop   03/06/20 1500  fluconazole (DIFLUCAN) tablet 100 mg     Discontinue     100 mg Oral Daily 03/06/20 1406 03/11/20 0959   03/03/20 1400  ceFAZolin (ANCEF) IVPB 1 g/50 mL premix     Discontinue     1 g 100 mL/hr over 30 Minutes  Intravenous Every 8 hours 03/03/20 1237     03/03/20 1115  valACYclovir (VALTREX) tablet 1,000 mg     Discontinue     1,000 mg Oral 2 times daily 03/03/20 1106         Objective: Vitals: Today's Vitals   03/09/20 0540 03/09/20 0816 03/09/20 0859 03/09/20 1045  BP:   126/71   Pulse:   87   Resp:      Temp:      TempSrc:      SpO2:      Weight:      Height:      PainSc: Asleep 0-No pain  Asleep    Intake/Output Summary (Last 24 hours) at 03/09/2020 1312 Last data filed at 03/09/2020 0900 Gross per 24 hour  Intake 845.58 ml  Output 2150 ml  Net -1304.42 ml   Filed Weights   03/03/20 0942  Weight: (!) 145.2 kg   Weight change:    Intake/Output from previous day: 07/10 0701 - 07/11 0700 In: 725.6 [I.V.:625.6; IV Piggyback:100] Out: 2150 [Urine:2150] Intake/Output this shift: Total I/O In: 120 [P.O.:120] Out: -   Examination:  General exam: AAOx3, obese,NAD, weak appearing. HEENT:Oral mucosa moist, Ear/Nose WNL grossly, dentition normal. Respiratory system: bilaterally clear,no wheezing or crackles,no use of accessory muscle Cardiovascular system: S1 & S2 +, No JVD,. Gastrointestinal system: Abdomen soft, obesity present with anterior abdominal wall skin fold/pannus with area of skin breakdown/wound. Nervous System:Alert, awake, moving extremities and grossly nonfocal Extremities: Bilateral lymphedema on legs, distal peripheral pulses palpable.  Skin: No rashes,no icterus. Inner thigh sacral area with skin breakdown MSK: Normal muscle bulk,tone, power  Data Reviewed: I have personally reviewed following labs and imaging studies CBC: Recent Labs  Lab 03/03/20 1021 03/03/20 1021 03/04/20 1110 03/04/20 1110 03/05/20 0500 03/05/20 0500 03/06/20 0543 03/06/20 1900 03/07/20 0500 03/08/20 0557 03/09/20 0506  WBC 17.2*   < > 17.6*   < > 23.1*  --  22.0*  --  21.9* 19.8* 21.9*  NEUTROABS 13.7*  --  14.1*  --  18.2*  --   --   --   --   --   --   HGB 7.9*   < >  7.3*   < > 7.3*   < > 6.9* 8.1* 8.3* 7.7* 8.2*  HCT 23.9*   < > 22.5*   < > 22.3*   < > 21.6* 25.3* 25.7* 23.8* 25.2*  MCV 93.0   < > 94.9   < > 95.3  --  96.4  --  93.8 94.1 94.0  PLT 127*   < > 161   < > 164  --  180  --  195 210 235   < > = values in this interval not displayed.   Basic Metabolic Panel: Recent Labs  Lab 03/04/20 1110 03/04/20 1110 03/05/20 0500 03/06/20 0543 03/07/20 0500 03/08/20 0557 03/09/20 0506  NA 136   < > 137 139 138 133* 137  K 3.9   < > 3.8 4.0 3.9 3.5 3.8  CL 102   < > 105 107 104 103 104  CO2 22   < > 24 24 24 23 26   GLUCOSE 120*   < > 136* 108* 107* 95 96  BUN 30*   < > 29* 24* 21 17 14   CREATININE 1.51*   < > 1.50* 1.24* 1.27* 1.02* 0.97  CALCIUM 7.9*   < > 8.4* 8.5* 8.5* 7.9* 8.2*  MG 1.3*  --  1.9 1.6*  --   --   --   PHOS 2.7  --  3.0  --   --   --   --    < > = values in this interval not displayed.   GFR: Estimated Creatinine Clearance: 80.3 mL/min (by C-G formula based on SCr of 0.97 mg/dL). Liver Function Tests: Recent Labs  Lab 03/04/20 1110 03/05/20 0500 03/06/20 0543 03/07/20 0500 03/08/20 0557  AST 11* 14* 10* 13* 16  ALT 13 11 8 8 7   ALKPHOS 54 67 65 82 72  BILITOT 0.6 0.6 0.5 0.9 0.6  PROT 5.3* 5.5* 5.1* 5.6* 5.1*  ALBUMIN 2.3* 2.4* 2.1* 2.3* 2.1*   No results for input(s): LIPASE, AMYLASE in the last 168 hours. No results for input(s): AMMONIA in the last 168 hours. Coagulation Profile: No results for input(s): INR, PROTIME in the last 168 hours. Cardiac Enzymes: No  results for input(s): CKTOTAL, CKMB, CKMBINDEX, TROPONINI in the last 168 hours. BNP (last 3 results) No results for input(s): PROBNP in the last 8760 hours. HbA1C: No results for input(s): HGBA1C in the last 72 hours. CBG: No results for input(s): GLUCAP in the last 168 hours. Lipid Profile: No results for input(s): CHOL, HDL, LDLCALC, TRIG, CHOLHDL, LDLDIRECT in the last 72 hours. Thyroid Function Tests: No results for input(s): TSH, T4TOTAL,  FREET4, T3FREE, THYROIDAB in the last 72 hours. Anemia Panel: No results for input(s): VITAMINB12, FOLATE, FERRITIN, TIBC, IRON, RETICCTPCT in the last 72 hours. Sepsis Labs: Recent Labs  Lab 03/03/20 1120 03/03/20 1313 03/04/20 1111  LATICACIDVEN 2.7* 2.1* 1.0    Recent Results (from the past 240 hour(s))  Virus culture     Status: None   Collection Time: 03/03/20 10:21 AM   Specimen: Skin Scraping  Result Value Ref Range Status   Viral Culture Comment  Final    Comment: (NOTE) Preliminary Report: No virus isolated at 4 days.  Next report to follow after 7 days. Performed At: [facility] [place], [place] [id number] Rush Farmer MD WE:[id number]    Source of Sample VAGINA  Final    Comment: Performed at [facility] [address] [place], [place] [address]  Culture, blood (routine x 2)     Status: None   Collection Time: 03/03/20 11:20 AM   Specimen: BLOOD  Result Value Ref Range Status   Specimen Description   Final    BLOOD RIGHT ANTECUBITAL Performed at [facility] [address], [place], [place] [address]    Special Requests   Final    BOTTLES DRAWN AEROBIC AND ANAEROBIC Blood Culture adequate volume Performed at [facility] [address], [place], [place] [address]    Culture   Final    NO GROWTH 5 DAYS Performed at [facility], [place] [address] [place], [place] [address]    Report Status 03/08/2020 FINAL  Final  Culture, blood (routine x 2)     Status: None   Collection Time: 03/03/20 11:20 AM   Specimen: BLOOD  Result Value Ref Range Status   Specimen Description   Final    BLOOD PORTA CATH Performed at [facility] [address], [place], [place] [address]    Special Requests   Final    BOTTLES DRAWN AEROBIC AND ANAEROBIC Blood Culture adequate volume Performed at [facility] [address] [place], [place]  [address]    Culture   Final    NO GROWTH 5 DAYS Performed at [facility], [place] [address], [place], [place] [address]    Report Status 03/08/2020 FINAL  Final  Urine culture     Status: Abnormal   Collection Time: 03/03/20 11:20 AM   Specimen: Urine, Clean Catch  Result Value Ref Range Status   Specimen Description   Final    URINE, CLEAN CATCH Performed at [facility], [place] [address] [place], [place] [address]    Special Requests   Final    NONE Performed at [facility], [place] [address] [place], [place] [address]    Culture (A)  Final    <10,000 COLONIES/mL INSIGNIFICANT GROWTH Performed at [facility] [address] [place],  [address]    Report Status 03/04/2020 FINAL  Final  SARS Coronavirus 2 by RT PCR (hospital order, performed in [facility]  hospital lab) Nasopharyngeal Nasopharyngeal Swab     Status: None   Collection Time: 03/03/20 11:20 AM   Specimen: Nasopharyngeal Swab  Result Value Ref Range Status   SARS Coronavirus 2 NEGATIVE NEGATIVE Final    Comment: (NOTE) SARS-CoV-2 target nucleic acids are NOT DETECTED.  The SARS-CoV-2 RNA is generally detectable in upper and lower respiratory specimens during the acute phase of infection. The lowest concentration of SARS-CoV-2 viral copies this assay can detect is 250 copies / mL. A negative result does not preclude SARS-CoV-2 infection and should not be used as the sole basis for treatment or other patient management decisions.  A negative result may occur with improper specimen collection / handling, submission of specimen other than nasopharyngeal swab, presence of viral mutation(s) within the areas targeted by this assay, and inadequate number of viral copies (<250 copies / mL). A negative result must be combined with clinical observations, patient history, and epidemiological information.  Fact Sheet for Patients:    StrictlyIdeas.no  Fact Sheet for Healthcare Providers: BankingDealers.co.za  This test is not yet approved or  cleared by the [place] FDA and has been authorized for detection and/or diagnosis of SARS-CoV-2 by FDA under an Emergency Use Authorization (EUA).  This EUA will remain in effect (meaning this test can be used) for the duration of the COVID-19 declaration under Section 564(b)(1) of the Act, 21 U.S.C. section 360bbb-3(b)(1), unless the authorization is terminated or revoked sooner.  Performed at [facility], [place] [address], [place], [place] [address]       Radiology Studies: CT ABDOMEN PELVIS W CONTRAST  Result Date: 03/07/2020 CLINICAL DATA:  Generalized abdominal pain. Ongoing diarrhea. History of left breast cancer with current chemotherapy. EXAM: CT ABDOMEN AND PELVIS WITH CONTRAST TECHNIQUE: Multidetector CT imaging of the abdomen and pelvis was performed using the standard protocol following bolus administration of intravenous contrast. CONTRAST:  120mL OMNIPAQUE IOHEXOL 300 MG/ML  SOLN COMPARISON:  CT 07/14/2012. Renal ultrasound 01/22/2020 FINDINGS: Lower chest: No focal airspace disease or pleural effusion. There are coronary artery calcifications. Hepatobiliary: Prominent liver spanning 18 cm cranial caudal. Decreased hepatic steatosis from prior exam. Questionable nodular hepatic contours. Decompressed gallbladder without gallstones or pericholecystic inflammation. Pancreas: No ductal dilatation or inflammation. Spleen: Normal in size without focal abnormality. Splenule anteriorly unchanged from prior. Adrenals/Urinary Tract: No adrenal nodule. There is no hydronephrosis. Symmetric bilateral perinephric edema. There are nonobstructing stones in the right kidney, largest measuring 7 mm. Parenchymal calcification in the lower left kidney. There is symmetric renal excretion on 6 minutes delay scan.  Partially distended urinary bladder. Air-fluid level in the bladder, Foley catheter in place. Equivocal bladder wall thickening. Stomach/Bowel: Decompressed stomach. No small bowel obstruction or inflammation. Administered enteric contrast reaches the colon. Normal terminal ileum. No appendicitis, the appendix is not well characterized. Enteric contrast reaches the distal colon. Equivocal mild wall thickening of the distal descending and proximal sigmoid colon but no pericolonic inflammation. This is in the region of multiple diverticula. There is no acute diverticulitis. No abnormal rectal distention. Vascular/Lymphatic: Minor aortic atherosclerosis. Patent portal vein. Few lower retroperitoneal nodes that are not enlarged by size criteria. Reproductive: Uterus and bilateral adnexa are unremarkable. Other: No free air or ascites. Subcutaneous edema in the bilateral flank soft tissues. Musculoskeletal: Diffuse degenerative change throughout the spine. Mild anterior wedging of superior endplate of L3 and L4 vertebra, age indeterminate but new from 2019. There are no acute or suspicious osseous abnormalities. IMPRESSION: 1. Equivocal mild wall thickening  of the distal descending and proximal sigmoid colon but no pericolonic inflammation. An element of colitis is considered in the setting of diarrhea. This is in the region of multiple diverticula, but no focal diverticulum. 2. Nonobstructing right nephrolithiasis. 3. Questionable nodular hepatic contours, can be seen with cirrhosis. Recommend correlation with cirrhosis risk factors. 4. Mild anterior wedging of superior endplate of L3 and L4 vertebra, age indeterminate but new from 2013. Aortic Atherosclerosis (ICD10-I70.0). Electronically Signed   By: Keith Rake M.D.   On: 03/07/2020 17:21     LOS: 6 days   Georgette Shell, MD [facility]  03/09/2020, 1:12 PM

## 2020-03-10 ENCOUNTER — Encounter: Payer: Self-pay | Admitting: *Deleted

## 2020-03-10 ENCOUNTER — Other Ambulatory Visit: Payer: Self-pay | Admitting: Oncology

## 2020-03-10 ENCOUNTER — Ambulatory Visit: Payer: Medicaid Other | Admitting: Family Medicine

## 2020-03-10 DIAGNOSIS — C773 Secondary and unspecified malignant neoplasm of axilla and upper limb lymph nodes: Secondary | ICD-10-CM | POA: Diagnosis not present

## 2020-03-10 DIAGNOSIS — C50412 Malignant neoplasm of upper-outer quadrant of left female breast: Secondary | ICD-10-CM | POA: Diagnosis not present

## 2020-03-10 DIAGNOSIS — D696 Thrombocytopenia, unspecified: Secondary | ICD-10-CM | POA: Diagnosis not present

## 2020-03-10 DIAGNOSIS — D6481 Anemia due to antineoplastic chemotherapy: Secondary | ICD-10-CM | POA: Diagnosis not present

## 2020-03-10 DIAGNOSIS — I10 Essential (primary) hypertension: Secondary | ICD-10-CM | POA: Diagnosis not present

## 2020-03-10 DIAGNOSIS — R238 Other skin changes: Secondary | ICD-10-CM | POA: Diagnosis not present

## 2020-03-10 DIAGNOSIS — N179 Acute kidney failure, unspecified: Secondary | ICD-10-CM | POA: Diagnosis not present

## 2020-03-10 DIAGNOSIS — D649 Anemia, unspecified: Secondary | ICD-10-CM | POA: Diagnosis not present

## 2020-03-10 DIAGNOSIS — Z9012 Acquired absence of left breast and nipple: Secondary | ICD-10-CM | POA: Diagnosis not present

## 2020-03-10 DIAGNOSIS — T451X5A Adverse effect of antineoplastic and immunosuppressive drugs, initial encounter: Secondary | ICD-10-CM | POA: Diagnosis not present

## 2020-03-10 DIAGNOSIS — Z170 Estrogen receptor positive status [ER+]: Secondary | ICD-10-CM | POA: Diagnosis not present

## 2020-03-10 DIAGNOSIS — D72829 Elevated white blood cell count, unspecified: Secondary | ICD-10-CM | POA: Diagnosis not present

## 2020-03-10 LAB — BASIC METABOLIC PANEL
Anion gap: 10 (ref 5–15)
BUN: 13 mg/dL (ref 8–23)
CO2: 24 mmol/L (ref 22–32)
Calcium: 8.1 mg/dL — ABNORMAL LOW (ref 8.9–10.3)
Chloride: 103 mmol/L (ref 98–111)
Creatinine, Ser: 0.9 mg/dL (ref 0.44–1.00)
GFR calc Af Amer: >60 mL/min (ref >60)
GFR calc non Af Amer: >60 mL/min (ref >60)
Glucose, Bld: 95 mg/dL (ref 70–99)
Potassium: 4.1 mmol/L (ref 3.5–5.1)
Sodium: 137 mmol/L (ref 135–145)

## 2020-03-10 LAB — CBC
HCT: 24.3 % — ABNORMAL LOW (ref 36.0–46.0)
Hemoglobin: 8 g/dL — ABNORMAL LOW (ref 12.0–15.0)
MCH: 30.8 pg (ref 26.0–34.0)
MCHC: 32.9 g/dL (ref 30.0–36.0)
MCV: 93.5 fL (ref 80.0–100.0)
Platelets: 240 10*3/uL (ref 150–400)
RBC: 2.6 MIL/uL — ABNORMAL LOW (ref 3.87–5.11)
RDW: 16.1 % — ABNORMAL HIGH (ref 11.5–15.5)
WBC: 20.6 10*3/uL — ABNORMAL HIGH (ref 4.0–10.5)
nRBC: 0.1 % (ref 0.0–0.2)

## 2020-03-10 LAB — GLUCOSE, CAPILLARY: Glucose-Capillary: 108 mg/dL — ABNORMAL HIGH (ref 70–99)

## 2020-03-10 NOTE — Progress Notes (Signed)
[name]   DOB:27-Mar-1956   FK#:[id number]   FVC#:[id number]  Subjective:  [name] tells me she may be able to get transferred to a rehab facility as early as today.  She is looking forward to it.  She tells me yesterday she stayed in bed most of the day but that Saturday, 03/08/2020 she sat up for a couple of hours.  She thinks she may be able to walk with a walker to the bathroom, but says her bed does not go down appropriately and she cannot get out of bed on her own right now.  She is tolerating the Foley well.  She tells me the breakdown of skin in her legs and behind are improving.  No family in room   Objective: African American woman examined in bed Vitals:   03/10/20 0527 03/10/20 0802  BP: 136/62 136/66  Pulse: 89 83  Resp: 17   Temp: 98.2 F (36.8 C)   SpO2: 100%     Body mass index is 60.46 kg/m.  Intake/Output Summary (Last 24 hours) at 03/10/2020 0825 Last data filed at 03/10/2020 6384 Gross per 24 hour  Intake 820 ml  Output 1751 ml  Net -931 ml   Sclerae unicteric, EOMs intact Lungs no rales or rhonchi, auscultated anterolaterally Heart regular rate and rhythm Abd soft, obese, nontender Neuro: nonfocal, well oriented, appropriate affect Breasts: Deferred   CBG (last 3)  No results for input(s): GLUCAP in the last 72 hours.   Labs:  Lab Results  Component Value Date   WBC 20.6 (H) 03/10/2020   HGB 8.0 (L) 03/10/2020   HCT 24.3 (L) 03/10/2020   MCV 93.5 03/10/2020   PLT 240 03/10/2020   NEUTROABS 18.2 (H) 03/05/2020    _0 @  Urine Studies No results for input(s): UHGB, CRYS in the last 72 hours.  Invalid input(s): UACOL, UAPR, USPG, UPH, UTP, UGL, UKET, UBIL, UNIT, UROB, ULEU, UEPI, UWBC, URBC, UBAC, CAST, [place], [place]  Basic Metabolic Panel: Recent Labs  Lab 03/04/20 1110 03/04/20 1110 03/05/20 0500 03/05/20 0500 03/06/20 0543 03/06/20 0543 03/07/20 0500 03/07/20 0500 03/08/20 0557 03/08/20 0557 03/09/20 0506 03/10/20 0612   NA 136   < > 137   < > 139  --  138  --  133*  --  137 137  K 3.9   < > 3.8   < > 4.0   < > 3.9   < > 3.5   < > 3.8 4.1  CL 102   < > 105   < > 107  --  104  --  103  --  104 103  CO2 22   < > 24   < > 24  --  24  --  23  --  26 24  GLUCOSE 120*   < > 136*   < > 108*  --  107*  --  95  --  96 95  BUN 30*   < > 29*   < > 24*  --  21  --  17  --  14 13  CREATININE 1.51*   < > 1.50*   < > 1.24*  --  1.27*  --  1.02*  --  0.97 0.90  CALCIUM 7.9*   < > 8.4*   < > 8.5*  --  8.5*  --  7.9*  --  8.2* 8.1*  MG 1.3*  --  1.9  --  1.6*  --   --   --   --   --   --   --  PHOS 2.7  --  3.0  --   --   --   --   --   --   --   --   --    < > = values in this interval not displayed.   GFR Estimated Creatinine Clearance: 86.5 mL/min (by C-G formula based on SCr of 0.9 mg/dL). Liver Function Tests: Recent Labs  Lab 03/04/20 1110 03/05/20 0500 03/06/20 0543 03/07/20 0500 03/08/20 0557  AST 11* 14* 10* 13* 16  ALT _0 ALKPHOS 54 67 65 82 72  BILITOT 0.6 0.6 0.5 0.9 0.6  PROT 5.3* 5.5* 5.1* 5.6* 5.1*  ALBUMIN 2.3* 2.4* 2.1* 2.3* 2.1*   No results for input(s): LIPASE, AMYLASE in the last 168 hours. No results for input(s): AMMONIA in the last 168 hours. Coagulation profile No results for input(s): INR, PROTIME in the last 168 hours.  CBC: Recent Labs  Lab 03/03/20 1021 03/03/20 1021 03/04/20 1110 03/04/20 1110 03/05/20 0500 03/05/20 0500 03/06/20 0543 03/06/20 0543 03/06/20 1900 03/07/20 0500 03/08/20 0557 03/09/20 0506 03/10/20 0612  WBC 17.2*   < > 17.6*   < > 23.1*   < > 22.0*  --   --  21.9* 19.8* 21.9* 20.6*  NEUTROABS 13.7*  --  14.1*  --  18.2*  --   --   --   --   --   --   --   --   HGB 7.9*   < > 7.3*   < > 7.3*   < > 6.9*   < > 8.1* 8.3* 7.7* 8.2* 8.0*  HCT 23.9*   < > 22.5*   < > 22.3*   < > 21.6*   < > 25.3* 25.7* 23.8* 25.2* 24.3*  MCV 93.0   < > 94.9   < > 95.3   < > 96.4  --   --  93.8 94.1 94.0 93.5  PLT 127*   < > 161   < > 164   < > 180  --   --  195  210 235 240   < > = values in this interval not displayed.   Cardiac Enzymes: No results for input(s): CKTOTAL, CKMB, CKMBINDEX, TROPONINI in the last 168 hours. BNP: Invalid input(s): POCBNP CBG: No results for input(s): GLUCAP in the last 168 hours. D-Dimer No results for input(s): DDIMER in the last 72 hours. Hgb A1c No results for input(s): HGBA1C in the last 72 hours. Lipid Profile No results for input(s): CHOL, HDL, LDLCALC, TRIG, CHOLHDL, LDLDIRECT in the last 72 hours. Thyroid function studies No results for input(s): TSH, T4TOTAL, T3FREE, THYROIDAB in the last 72 hours.  Invalid input(s): FREET3 Anemia work up No results for input(s): VITAMINB12, FOLATE, FERRITIN, TIBC, IRON, RETICCTPCT in the last 72 hours. Microbiology Recent Results (from the past 240 hour(s))  Virus culture     Status: None   Collection Time: 03/03/20 10:21 AM   Specimen: Skin Scraping  Result Value Ref Range Status   Viral Culture Comment  Final    Comment: (NOTE) Preliminary Report: No virus isolated at 4 days.  Next report to follow after 7 days. Performed At: [facility] [place], [place] [id number] Rush Farmer MD PN:[id number]    Source of Sample VAGINA  Final    Comment: Performed at [facility] [address], [place], [place] [address]  Culture, blood (routine x 2)     Status: None  Collection Time: 03/03/20 11:20 AM   Specimen: BLOOD  Result Value Ref Range Status   Specimen Description   Final    BLOOD RIGHT ANTECUBITAL Performed at [facility] [address] [place], [place] [address]    Special Requests   Final    BOTTLES DRAWN AEROBIC AND ANAEROBIC Blood Culture adequate volume Performed at [facility] [address] [place], [place] [address]    Culture   Final    NO GROWTH 5 DAYS Performed at [facility], [place] [address] [place], [place] [address]    Report Status  03/08/2020 FINAL  Final  Culture, blood (routine x 2)     Status: None   Collection Time: 03/03/20 11:20 AM   Specimen: BLOOD  Result Value Ref Range Status   Specimen Description   Final    BLOOD PORTA CATH Performed at [facility] [address], [place], [place] [address]    Special Requests   Final    BOTTLES DRAWN AEROBIC AND ANAEROBIC Blood Culture adequate volume Performed at [facility] [address], [place], [place] [address]    Culture   Final    NO GROWTH 5 DAYS Performed at [facility], [place] [address] [place], [place] [address]    Report Status 03/08/2020 FINAL  Final  Urine culture     Status: Abnormal   Collection Time: 03/03/20 11:20 AM   Specimen: Urine, Clean Catch  Result Value Ref Range Status   Specimen Description   Final    URINE, CLEAN CATCH Performed at [facility], [place] [address], [place], [place] [address]    Special Requests   Final    NONE Performed at [facility], [place] [address] [place], [place] [address]    Culture (A)  Final    <10,000 COLONIES/mL INSIGNIFICANT GROWTH Performed at [facility] [address], [place], [place] [address]    Report Status 03/04/2020 FINAL  Final  SARS Coronavirus 2 by RT PCR (hospital order, performed in [facility] hospital lab) Nasopharyngeal Nasopharyngeal Swab     Status: None   Collection Time: 03/03/20 11:20 AM   Specimen: Nasopharyngeal Swab  Result Value Ref Range Status   SARS Coronavirus 2 NEGATIVE NEGATIVE Final    Comment: (NOTE) SARS-CoV-2 target nucleic acids are NOT DETECTED.  The SARS-CoV-2 RNA is generally detectable in upper and lower respiratory specimens during the acute phase of infection. The lowest concentration of SARS-CoV-2 viral copies this assay can detect is 250 copies / mL. A negative result does not preclude SARS-CoV-2 infection and should not be used as the sole basis for  treatment or other patient management decisions.  A negative result may occur with improper specimen collection / handling, submission of specimen other than nasopharyngeal swab, presence of viral mutation(s) within the areas targeted by this assay, and inadequate number of viral copies (<250 copies / mL). A negative result must be combined with clinical observations, patient history, and epidemiological information.  Fact Sheet for Patients:   StrictlyIdeas.no  Fact Sheet for Healthcare Providers: BankingDealers.co.za  This test is not yet approved or  cleared by the [place] FDA and has been authorized for detection and/or diagnosis of SARS-CoV-2 by FDA under an Emergency Use Authorization (EUA).  This EUA will remain in effect (meaning this test can be used) for the duration of the COVID-19 declaration under Section 564(b)(1) of the Act,  21 U.S.C. section 360bbb-3(b)(1), unless the authorization is terminated or revoked sooner.  Performed at [facility], [place] [address], [place], [place] [address]       Studies:  No results found.  Assessment: 64 y.o. [place] woman status post left breast upper outer quadrant and left axillary lymph node biopsies 11/12/2019 for a clinical T2 N2, stage IIIA invasive ductal carcinoma, grade 3, estrogen and progesterone receptor positive, HER-2 not amplified, with an MIB-1 of 70%             (a) the biopsied axillary lymph node was positive  (1) genetics testing 11/29/2019 through the Breast Cancer STAT Panel offered by [facility] found no deleterious mutations in ATM, BRCA1, BRCA2, CDH1, CHEK2, PALB2, PTEN, STK11 and TP53.  Additional testing through the Common Hereditary Cancers Panel confirmed no deleterious mutations in APC, ATM, AXIN2, BARD1, BMPR1A, BRCA1, BRCA2, BRIP1, CDH1, CDK4, CDKN2A (p14ARF), CDKN2A (p16INK4a), CHEK2, CTNNA1, DICER1, EPCAM (Deletion/duplication  testing only), GREM1 (promoter region deletion/duplication testing only), KIT, MEN1, MLH1, MSH2, MSH3, MSH6, MUTYH, NBN, NF1, NHTL1, PALB2, PDGFRA, PMS2, POLD1, POLE, PTEN, RAD50, RAD51C, RAD51D, RNF43, SDHB, SDHC, SDHD, SMAD4, SMARCA4. STK11, TP53, TSC1, TSC2, and VHL.  The following genes were evaluated for sequence changes only: SDHA and HOXB13 c.251G>A variant only.  (2) status post left modified radical mastectomy 12/19/2019 for a pT2 pN1, stage IIB invasive ductal carcinoma, grade 3, with negative margins.             (a) 1 of 13 axillary lymph nodes removed was positive  (3) adjuvant chemotherapy consisting of cyclophosphamide and doxorubicin in dose dense fashion x4 starting 01/22/2020 to be followed by weekly paclitaxel x12             (a) baseline echocardiogram 11/26/2019 shows an ejection fraction in the 65-70% range.             (b) cyclophosphamide and doxorubicin dose reduced 18% given comorbidities  (4) adjuvant radiation as appropriate  (5) antiestrogens to follow   Plan:  [name] is scheduled for chemotherapy at the cancer center tomorrow 03/11/2020.  However she is in process of transitioning to a skilled nursing facility/rehab facility.  I think it would be prudent to let that process continue and then try to get her treated this Thursday or Friday.  I will make those changes.  I explained to her that her chemotherapy has 2 portions.  She has had 2 of the 4 intense doses.  Actually she tolerated them generally well as far as counts is concerned although she had other issues related to her comorbidities.  Nevertheless we are going to hold the next 2 intense treatments and switch over to the more easy weekly paclitaxel treatments.  These do not require Neupogen or Neulasta and the main concern is the possible development of neuropathy.  Note that at this point she denies any symptoms of neuropathy and she does not have a history of diabetes  We will be working with her  skilled nursing facility to arrange for transportation to our cancer center late this week  I greatly appreciate your help to this patient and her family!    Chauncey Cruel, MD 03/10/2020  8:25 AM Medical Oncology and Hematology [facility] [address] [place], [place] [address] Tel. [phone]    Fax. [phone]

## 2020-03-10 NOTE — Plan of Care (Signed)
Pt VS WNL. No complaints at this time.   Problem: Education: Goal: Knowledge of General Education information will improve Description: Including pain rating scale, medication(s)/side effects and non-pharmacologic comfort measures Outcome: Progressing   Problem: Clinical Measurements: Goal: Diagnostic test results will improve Outcome: Progressing Goal: Cardiovascular complication will be avoided Outcome: Progressing   Problem: Activity: Goal: Risk for activity intolerance will decrease Outcome: Progressing   Problem: Coping: Goal: Level of anxiety will decrease Outcome: Progressing   Problem: Elimination: Goal: Will not experience complications related to bowel motility Outcome: Progressing   Problem: Pain Managment: Goal: General experience of comfort will improve Outcome: Progressing   Problem: Safety: Goal: Ability to remain free from injury will improve Outcome: Progressing   Problem: Skin Integrity: Goal: Risk for impaired skin integrity will decrease Outcome: Not Progressing

## 2020-03-10 NOTE — Progress Notes (Signed)
PROGRESS NOTE    [name]  MBW:[id number] DOB: [birth year] DOA: 03/03/2020 PCP: [name], MD   Chef Complaints: Skin breakdown  Brief Narrative: 64 YO female with breast cancer status postmastectomy in April 2021 with negative margins undergoing adjuvant chemo Adriamycin plus cyclophosphamide last dose last week prior to admission, morbidly obese BMI 60, poorly mobile, lymphedema/venous insufficiency/CKD, hypertension recent admission for the inner thighs and perineum, who lives alone and a family member comes in to take care of her, has been incontinent with urine at all times noted to have a significant skin breakdown in the shower, sent to the ED for evaluation  In the ED found to have a lot of skin breakdown maceration several vesicles and also significant leukocytosis anemia, thrombocytopenia elevated BUN/creatinine of 1.6 up from 1.3 on June/30.  Foley catheter was placed, patient was admitted for IV antibiotics IV fluids hydration Patient is followed by wound care, wound is healing well.  She is on IV antibiotics. She remains deconditioned weak seen by PT and SNF has been advised initially reluctant however after discussion with her oncologist she has agreed to go to skilled nursing rehab.  Subjective:  Patient feels well denies abdominal pain nausea vomiting. She is hoping to go to skilled nursing facility soon. Blood pressure stable  Assessment & Plan:  Multiple skin breakdown and cellulitis in the inner thighs/perneum/abdominal pannus area: Overall patient is improving clinically. Suspect is multifactorial with poor mobility morbid obesity incontinent of urine in the setting of chemotherapy. On admission had lactic acidosis that has resolved. Also with leukocytosis but also contributed by her Neulasta. Overall improving pain wise, no fever. On Ancef while here and can discharge on Keflex. Had abdominal pain for which she had CT abdomen pelvis that showed equivocal mild wall  thickening of the distal descending and proximal sigmoid colon but patient without adiarrhea.  Leukocytosis: Artifactual due to Neulasta and her cellulitis/infection. Culture returned no growth. WBC now downtrending. Recent Labs  Lab 03/06/20 0543 03/07/20 0500 03/08/20 0557 03/09/20 0506 03/10/20 0612  WBC 22.0* 21.9* 19.8* 21.9* 20.6*   Oral thrush s/p diflucan  Essential hypertension: BP is controlled on Coreg and hydralazine. Monitor.  AKI: renal function remains stable. Creatinine 0.8 at baseline, peaked to 1.2 currently at 0.9.   Metabolic acidosis: Improved.  Folate deficiency/anemia of chronic disease hemoglobin 8.5: On admission 7.9 g and has decreased to 6.9, suspect multifactorial in the setting of cancer, folate deficiency. Status post 1 unit PRBC and Magrinat 8.1 g. Monitor intermittently. Continue folate supplementation.  Deconditioning/poor mobility/malaise weakness: Awaiting for skilled nursing facility.  T2 N1 stage IIb grade 3 ER positive breast cancer followed by medical oncology chemotherapy session 7/6 has been posted for 7/13 but patient was admitted. Seen by oncology who will arrange for outpatient follow-up after discharge. Appreciate input.  Gout continue colchicine.  Thrombocytopenia platelet counts are stable and normal  Hypomagnesemia: Repleted Mag-Ox.   Super morbid obesity with BMI 60. Follow-up outpatient for healthy lifestyle/weight loss strategy  Pressure ulcer present on admission on buttock as below: We will continue wound care 03/03/20 2308  Location: Buttocks  Location Orientation: Posterior  Staging: Stage 3 -  Full thickness tissue loss. Subcutaneous fat may be visible but bone, tendon or muscle are NOT exposed.  Wound Description (Comments):   Present on Admission: Yes     Pressure Injury 03/03/20 Buttocks Stage 3 -  Full thickness tissue loss. Subcutaneous fat may be visible but bone, tendon or muscle are NOT exposed. (  Active)    03/03/20 2308  Location: Buttocks  Location Orientation:   Staging: Stage 3 -  Full thickness tissue loss. Subcutaneous fat may be visible but bone, tendon or muscle are NOT exposed.  Wound Description (Comments):   Present on Admission: Yes   DVT prophylaxis: lovenox  Code Status: FULL Family Communication: plan of care discussed with patient at bedside.  Status is: Inpatient  Remains inpatient appropriate because:Unsafe d/c plan, IV treatments appropriate due to intensity of illness or inability to take PO and Inpatient level of care appropriate due to severity of illness   Dispo: The patient is from: Home              Anticipated d/c is to: SNF               Anticipated d/c date is: Once SNF approval is received              Patient currently is medically stable for discharge  Nutrition: Diet Order            DIET SOFT Room service appropriate? Yes; Fluid consistency: Thin  Diet effective now                 Body mass index is 60.46 kg/m. Consultants:see note  Procedures:see note Microbiology:see note  Medications: Scheduled Meds: . aspirin EC  81 mg Oral Daily  . carvedilol  12.5 mg Oral BID WC  . Chlorhexidine Gluconate Cloth  6 each Topical Daily  . colchicine  0.3 mg Oral QODAY  . enoxaparin (LOVENOX) injection  60 mg Subcutaneous Q24H  . folic acid  1 mg Oral Daily  . hydrALAZINE  25 mg Oral Q8H  . magnesium oxide  400 mg Oral BID  . valACYclovir  1,000 mg Oral BID   Continuous Infusions: . sodium chloride 10 mL/hr at 03/09/20 1708  .  ceFAZolin (ANCEF) IV 1 g (03/10/20 1457)    Antimicrobials: Anti-infectives (From admission, onward)   Start     Dose/Rate Route Frequency Ordered Stop   03/06/20 1500  fluconazole (DIFLUCAN) tablet 100 mg        100 mg Oral Daily 03/06/20 1406 03/10/20 1020   03/03/20 1400  ceFAZolin (ANCEF) IVPB 1 g/50 mL premix     Discontinue     1 g 100 mL/hr over 30 Minutes Intravenous Every 8 hours 03/03/20 1237     03/03/20  1115  valACYclovir (VALTREX) tablet 1,000 mg     Discontinue     1,000 mg Oral 2 times daily 03/03/20 1106       Objective: Vitals: Today's Vitals   03/10/20 0800 03/10/20 0802 03/10/20 1252 03/10/20 1458  BP:  136/66 (!) 141/68 (!) 141/76  Pulse:  83 92   Resp:   16   Temp:   97.9 F (36.6 C)   TempSrc:   Oral   SpO2:   100%   Weight:      Height:      PainSc: 0-No pain       Intake/Output Summary (Last 24 hours) at 03/10/2020 1534 Last data filed at 03/10/2020 0213 Gross per 24 hour  Intake 460 ml  Output 1750 ml  Net -1290 ml   Filed Weights   03/03/20 0942  Weight: (!) 145.2 kg   Weight change:    Intake/Output from previous day: 07/11 0701 - 07/12 0700 In: 820 [P.O.:600; I.V.:120; IV Piggyback:100] Out: 1610 [Urine:1750; Stool:1] Intake/Output this shift: No intake/output data recorded.  Examination:  General exam: AAOx3,NAD, morbidly obese, not in acute distress, weak appearing. HEENT:Oral mucosa moist, Ear/Nose WNL grossly, dentition normal. Respiratory system: bilaterally clear,no wheezing or crackles,no use of accessory muscle Cardiovascular system: S1 & S2 +, No JVD,. Gastrointestinal system: Abdomen with abdominal obesity anterior abdominal wall skin pannus with foul smell in multiple areas of wound- improving. BS+ Nervous System:Alert, awake, moving extremities and grossly nonfocal Extremities: No edema, distal peripheral pulses palpable.  Skin: No rashes,no icterus. MSK: Normal muscle bulk,tone, power  Data Reviewed: I have personally reviewed following labs and imaging studies CBC: Recent Labs  Lab 03/04/20 1110 03/04/20 1110 03/05/20 0500 03/05/20 0500 03/06/20 0543 03/06/20 0543 03/06/20 1900 03/07/20 0500 03/08/20 0557 03/09/20 0506 03/10/20 0612  WBC 17.6*   < > 23.1*   < > 22.0*  --   --  21.9* 19.8* 21.9* 20.6*  NEUTROABS 14.1*  --  18.2*  --   --   --   --   --   --   --   --   HGB 7.3*   < > 7.3*   < > 6.9*   < > 8.1* 8.3* 7.7*  8.2* 8.0*  HCT 22.5*   < > 22.3*   < > 21.6*   < > 25.3* 25.7* 23.8* 25.2* 24.3*  MCV 94.9   < > 95.3   < > 96.4  --   --  93.8 94.1 94.0 93.5  PLT 161   < > 164   < > 180  --   --  195 210 235 240   < > = values in this interval not displayed.   Basic Metabolic Panel: Recent Labs  Lab 03/04/20 1110 03/04/20 1110 03/05/20 0500 03/05/20 0500 03/06/20 0543 03/07/20 0500 03/08/20 0557 03/09/20 0506 03/10/20 0612  NA 136   < > 137   < > 139 138 133* 137 137  K 3.9   < > 3.8   < > 4.0 3.9 3.5 3.8 4.1  CL 102   < > 105   < > 107 104 103 104 103  CO2 22   < > 24   < > 24 24 23 26 24   GLUCOSE 120*   < > 136*   < > 108* 107* 95 96 95  BUN 30*   < > 29*   < > 24* 21 17 14 13   CREATININE 1.51*   < > 1.50*   < > 1.24* 1.27* 1.02* 0.97 0.90  CALCIUM 7.9*   < > 8.4*   < > 8.5* 8.5* 7.9* 8.2* 8.1*  MG 1.3*  --  1.9  --  1.6*  --   --   --   --   PHOS 2.7  --  3.0  --   --   --   --   --   --    < > = values in this interval not displayed.   GFR: Estimated Creatinine Clearance: 86.5 mL/min (by C-G formula based on SCr of 0.9 mg/dL). Liver Function Tests: Recent Labs  Lab 03/04/20 1110 03/05/20 0500 03/06/20 0543 03/07/20 0500 03/08/20 0557  AST 11* 14* 10* 13* 16  ALT 13 11 8 8 7   ALKPHOS 54 67 65 82 72  BILITOT 0.6 0.6 0.5 0.9 0.6  PROT 5.3* 5.5* 5.1* 5.6* 5.1*  ALBUMIN 2.3* 2.4* 2.1* 2.3* 2.1*   No results for input(s): LIPASE, AMYLASE in the last 168 hours. No results for input(s): AMMONIA in the last  168 hours. Coagulation Profile: No results for input(s): INR, PROTIME in the last 168 hours. Cardiac Enzymes: No results for input(s): CKTOTAL, CKMB, CKMBINDEX, TROPONINI in the last 168 hours. BNP (last 3 results) No results for input(s): PROBNP in the last 8760 hours. HbA1C: No results for input(s): HGBA1C in the last 72 hours. CBG: Recent Labs  Lab 03/10/20 0722  GLUCAP 108*   Lipid Profile: No results for input(s): CHOL, HDL, LDLCALC, TRIG, CHOLHDL, LDLDIRECT in  the last 72 hours. Thyroid Function Tests: No results for input(s): TSH, T4TOTAL, FREET4, T3FREE, THYROIDAB in the last 72 hours. Anemia Panel: No results for input(s): VITAMINB12, FOLATE, FERRITIN, TIBC, IRON, RETICCTPCT in the last 72 hours. Sepsis Labs: Recent Labs  Lab 03/04/20 1111  LATICACIDVEN 1.0    Recent Results (from the past 240 hour(s))  Virus culture     Status: None   Collection Time: 03/03/20 10:21 AM   Specimen: Skin Scraping  Result Value Ref Range Status   Viral Culture Comment  Final    Comment: (NOTE) Preliminary Report: No virus isolated at 4 days.  Next report to follow after 7 days. Performed At: [facility] [place], [place] [id number] Rush Farmer MD SF:[id number]    Source of Sample VAGINA  Final    Comment: Performed at [facility] [address] [place], [place] [address]  Culture, blood (routine x 2)     Status: None   Collection Time: 03/03/20 11:20 AM   Specimen: BLOOD  Result Value Ref Range Status   Specimen Description   Final    BLOOD RIGHT ANTECUBITAL Performed at [facility] [address] [place], [place] [address]    Special Requests   Final    BOTTLES DRAWN AEROBIC AND ANAEROBIC Blood Culture adequate volume Performed at [place] [address] [place], [place] [address]    Culture   Final    NO GROWTH 5 DAYS Performed at [facility], [place] [address], [place], [place] [address]    Report Status 03/08/2020 FINAL  Final  Culture, blood (routine x 2)     Status: None   Collection Time: 03/03/20 11:20 AM   Specimen: BLOOD  Result Value Ref Range Status   Specimen Description   Final    BLOOD PORTA CATH Performed at [facility] [address] [place], [place] [address]    Special Requests   Final    BOTTLES DRAWN AEROBIC AND ANAEROBIC Blood Culture adequate volume Performed at [facility] [address] [place], [place] [address]    Culture   Final    NO GROWTH 5 DAYS Performed at [facility], [place] [address] [place], [place] [address]    Report Status 03/08/2020 FINAL  Final  Urine culture     Status: Abnormal   Collection Time: 03/03/20 11:20 AM   Specimen: Urine, Clean Catch  Result Value Ref Range Status   Specimen Description   Final    URINE, CLEAN CATCH Performed at [facility], [place] [address], [place], [place] [address]    Special Requests   Final    NONE Performed at [facility], [place] [address], [place], [place] [address]    Culture (A)  Final    <10,000 COLONIES/mL INSIGNIFICANT GROWTH Performed at [facility] [address] [place], [place] [address]    Report Status 03/04/2020 FINAL  Final  SARS Coronavirus 2 by RT PCR (hospital order, performed in [facility] hospital lab) Nasopharyngeal Nasopharyngeal Swab     Status: None   Collection Time: 03/03/20 11:20 AM   Specimen: Nasopharyngeal Swab  Result Value Ref Range Status   SARS Coronavirus 2 NEGATIVE NEGATIVE Final    Comment: (NOTE) SARS-CoV-2 target nucleic acids are NOT DETECTED.  The SARS-CoV-2 RNA is generally detectable in upper and lower respiratory specimens during the acute phase of infection. The lowest concentration of SARS-CoV-2 viral copies this assay can detect is 250 copies / mL. A negative result does not preclude SARS-CoV-2 infection and should not be used as the sole basis for treatment or other patient management decisions.  A negative result may occur with improper specimen collection / handling, submission of specimen other than nasopharyngeal swab, presence of viral mutation(s) within the areas targeted by this assay, and inadequate number of viral copies (<250 copies / mL). A negative result must be combined with clinical observations, patient history, and epidemiological information.  Fact Sheet for Patients:     StrictlyIdeas.no  Fact Sheet for Healthcare Providers: BankingDealers.co.za  This test is not yet approved or  cleared by the [place] FDA and has been authorized for detection and/or diagnosis of SARS-CoV-2 by FDA under an Emergency Use Authorization (EUA).  This EUA will remain in effect (meaning this test can be used) for the duration of the COVID-19 declaration under Section 564(b)(1) of the Act, 21 U.S.C. section 360bbb-3(b)(1), unless the authorization is terminated or revoked sooner.  Performed at [facility], [place] [address], [place], [place] [address]       Radiology Studies: No results found.   LOS: 7 days   Antonieta Pert, MD [facility]  03/10/2020, 3:34 PM

## 2020-03-10 NOTE — TOC Progression Note (Signed)
Transition of Care [facility]) - Progression Note    Patient Details  Name: [name] MRN: [id number] Date of Birth: 04-09-1956  [facility] of [facility] CM/SW Contact  Autumnrose Yore, Marjie Skiff, RN Phone Number: 03/10/2020, 2:22 PM  Clinical Narrative:     This CM was informed that [facility] can no longer take the patient due to her upcoming chemo. This CM has called and left messages with multiple SNFs to potentially find another SNF bed. MD informed.  Expected Discharge Plan: Skilled Nursing Facility Barriers to Discharge: Continued Medical Work up  Expected Discharge Plan and Services Expected Discharge Plan: [place]   Discharge Planning Services: CM Consult   Living arrangements for the past 2 months: Single Family Home                                       Social Determinants of Health (SDOH) Interventions    Readmission Risk Interventions Readmission Risk Prevention Plan 03/06/2020 02/13/2020  Transportation Screening Complete Complete  PCP or Specialist Appt within 3-5 Days Not Complete -  Not Complete comments Unsure of dc date at this time -  [place] or [place] Complete -  Social Work Consult for [place] Planning/Counseling Complete -  Palliative Care Screening Not Applicable -  Medication Review Press photographer) Complete Complete  PCP or Specialist appointment within 3-5 days of discharge - Complete  [place] or [place] - Complete  SW Recovery Care/Counseling Consult - Complete  [place] - Not Applicable  Some recent data might be hidden

## 2020-03-10 NOTE — Progress Notes (Signed)
Occupational Therapy Treatment Patient Details Name: [name] MRN: [id number] DOB: 03/29/1956 Today's Date: 03/10/2020    History of present illness 64 yo female admitted with skin breakdown/cellulitis, weakness, difficulty functioning at home. Hx of breast ca s/p mastectomy, lymphedema, poor wound healing, venous insufficiency, CKD, morbid obesity, falls   OT comments  Pt issued theraband.  Pt states she would perform daily.  Pt often ask for a book to pass the time.  OT provided this pt with a book.   Pt appreciative   Follow Up Recommendations  SNF;Supervision/Assistance - 24 hour    Equipment Recommendations  Other (comment) (TBD)    Recommendations for Other Services      Precautions / Restrictions Precautions Precautions: Fall              ADL either performed or assessed with clinical judgement   ADL Overall ADL's : Needs assistance/impaired     Grooming: Set up;Wash/dry face;Bed level;Oral care                                                 Cognition Arousal/Alertness: Awake/alert;Lethargic Behavior During Therapy: WFL for tasks assessed/performed Overall Cognitive Status: Within Functional Limits for tasks assessed                                          Exercises Shoulder Exercises Shoulder Flexion: AROM;Theraband;Both Theraband Level (Shoulder Flexion): Level 2 (Red) Shoulder Extension: AROM;Both;Theraband Theraband Level (Shoulder Extension): Level 2 (Red) Shoulder External Rotation: AROM;Theraband;20 reps Theraband Level (Shoulder External Rotation): Level 2 (Red) Elbow Extension: AROM;20 reps;Both Wrist Flexion: AROM;20 reps;Both Wrist Extension: AROM;20 reps;Both   Shoulder Instructions            Pertinent Vitals/ Pain       Pain Assessment: No/denies pain         Frequency  Min 2X/week        Progress Toward Goals  OT Goals(current goals can now be found in the care plan  section)  Progress towards OT goals: Progressing toward goals     Plan Discharge plan remains appropriate    Co-evaluation                 AM-PAC OT "6 Clicks" Daily Activity     Outcome Measure   Help from another person eating meals?: A Little Help from another person taking care of personal grooming?: A Little Help from another person toileting, which includes using toliet, bedpan, or urinal?: A Lot Help from another person bathing (including washing, rinsing, drying)?: A Lot Help from another person to put on and taking off regular upper body clothing?: A Little Help from another person to put on and taking off regular lower body clothing?: A Lot 6 Click Score: 15    End of Session    OT Visit Diagnosis: Unsteadiness on feet (R26.81);Other abnormalities of gait and mobility (R26.89);Pain Pain - part of body:  (global)   Activity Tolerance Patient limited by fatigue;Patient tolerated treatment well   Patient Left with call bell/phone within reach;in bed   Nurse Communication Mobility status;Other (comment) (informed RN that wound RN ordering inter dry)        Time: 1414-1430 OT Time Calculation (min): 16 min  Charges: OT General Charges $  OT Visit: 1 Visit OT Treatments $Therapeutic Exercise: 8-22 mins  Kari Baars, [place] [phone] Office- [phone], Thereasa Parkin 03/10/2020, 6:50 PM

## 2020-03-11 ENCOUNTER — Ambulatory Visit: Payer: Medicaid Other

## 2020-03-11 ENCOUNTER — Other Ambulatory Visit: Payer: Medicaid Other

## 2020-03-11 ENCOUNTER — Telehealth: Payer: Self-pay | Admitting: Oncology

## 2020-03-11 ENCOUNTER — Ambulatory Visit: Payer: Medicaid Other | Admitting: Oncology

## 2020-03-11 DIAGNOSIS — D6481 Anemia due to antineoplastic chemotherapy: Secondary | ICD-10-CM | POA: Diagnosis not present

## 2020-03-11 DIAGNOSIS — T451X5A Adverse effect of antineoplastic and immunosuppressive drugs, initial encounter: Secondary | ICD-10-CM | POA: Diagnosis not present

## 2020-03-11 DIAGNOSIS — D72829 Elevated white blood cell count, unspecified: Secondary | ICD-10-CM | POA: Diagnosis not present

## 2020-03-11 DIAGNOSIS — I10 Essential (primary) hypertension: Secondary | ICD-10-CM | POA: Diagnosis not present

## 2020-03-11 DIAGNOSIS — R238 Other skin changes: Secondary | ICD-10-CM | POA: Diagnosis not present

## 2020-03-11 DIAGNOSIS — N179 Acute kidney failure, unspecified: Secondary | ICD-10-CM | POA: Diagnosis not present

## 2020-03-11 LAB — BASIC METABOLIC PANEL
Anion gap: 9 (ref 5–15)
BUN: 12 mg/dL (ref 8–23)
CO2: 27 mmol/L (ref 22–32)
Calcium: 8.2 mg/dL — ABNORMAL LOW (ref 8.9–10.3)
Chloride: 102 mmol/L (ref 98–111)
Creatinine, Ser: 0.83 mg/dL (ref 0.44–1.00)
GFR calc Af Amer: >60 mL/min (ref >60)
GFR calc non Af Amer: >60 mL/min (ref >60)
Glucose, Bld: 101 mg/dL — ABNORMAL HIGH (ref 70–99)
Potassium: 4 mmol/L (ref 3.5–5.1)
Sodium: 138 mmol/L (ref 135–145)

## 2020-03-11 LAB — CBC
HCT: 25 % — ABNORMAL LOW (ref 36.0–46.0)
Hemoglobin: 8.1 g/dL — ABNORMAL LOW (ref 12.0–15.0)
MCH: 30.8 pg (ref 26.0–34.0)
MCHC: 32.4 g/dL (ref 30.0–36.0)
MCV: 95.1 fL (ref 80.0–100.0)
Platelets: 256 10*3/uL (ref 150–400)
RBC: 2.63 MIL/uL — ABNORMAL LOW (ref 3.87–5.11)
RDW: 16 % — ABNORMAL HIGH (ref 11.5–15.5)
WBC: 18.9 10*3/uL — ABNORMAL HIGH (ref 4.0–10.5)
nRBC: 0.1 % (ref 0.0–0.2)

## 2020-03-11 LAB — VIRUS CULTURE

## 2020-03-11 MED ORDER — CEPHALEXIN 500 MG PO CAPS
500.0000 mg | ORAL_CAPSULE | Freq: Four times a day (QID) | ORAL | Status: DC
  Administered 2020-03-11 – 2020-03-17 (×24): 500 mg via ORAL
  Filled 2020-03-11 (×24): qty 1

## 2020-03-11 NOTE — TOC Progression Note (Signed)
Transition of Care [facility]) - Progression Note    Patient Details  Name: Latroya Ng MRN: [id number] Date of Birth: September 28, 1955  Transition of Care [facility]) CM/SW Contact  Itzamar Traynor, Marjie Skiff, RN Phone Number: 03/11/2020, 3:22 PM  Clinical Narrative:    [facility] has accepted pt and has started [place] with pt insurance. Pt updated and is ok with plan.   Expected Discharge Plan: Skilled Nursing Facility Barriers to Discharge: Continued Medical Work up  Expected Discharge Plan and Services Expected Discharge Plan: [place]   Discharge Planning Services: CM Consult   Living arrangements for the past 2 months: Single Family Home                                       Social Determinants of Health (SDOH) Interventions    Readmission Risk Interventions Readmission Risk Prevention Plan 03/06/2020 02/13/2020  Transportation Screening Complete Complete  PCP or Specialist Appt within 3-5 Days Not Complete -  Not Complete comments Unsure of dc date at this time -  [place] or [place] Complete -  Social Work Consult for [place] Planning/Counseling Complete -  Palliative Care Screening Not Applicable -  Medication Review Press photographer) Complete Complete  PCP or Specialist appointment within 3-5 days of discharge - Complete  [place] or [place] - Complete  SW Recovery Care/Counseling Consult - Complete  [place] - Not Applicable  Some recent data might be hidden

## 2020-03-11 NOTE — Progress Notes (Signed)
PROGRESS NOTE    [name]  TIW:[id number] DOB: February 05, 1956 DOA: 03/03/2020 PCP: [name], MD   Chef Complaints: Skin breakdown  Brief Narrative: 64 YO female with breast cancer status postmastectomy in April 2021 with negative margins undergoing adjuvant chemo Adriamycin plus cyclophosphamide last dose last week prior to admission, morbidly obese BMI 60, poorly mobile, lymphedema/venous insufficiency/CKD, hypertension recent admission for the inner thighs and perineum, who lives alone and a family member comes in to take care of her, has been incontinent with urine at all times noted to have a significant skin breakdown in the shower, sent to the ED for evaluation  In the ED found to have a lot of skin breakdown maceration several vesicles and also significant leukocytosis anemia, thrombocytopenia elevated BUN/creatinine of 1.6 up from 1.3 on June/30.  Foley catheter was placed, patient was admitted for IV antibiotics IV fluids hydration Patient is followed by wound care, wound is healing well.  She is on IV antibiotics. She remains deconditioned weak seen by PT and SNF has been advised initially reluctant however after discussion with her oncologist. she has agreed to go to skilled nursing rehab.  Subjective: Patient feels well has no new complaints.  Pain is controlled.  No fever WBC improving.   Assessment & Plan:  Multiple skin breakdown and cellulitis in the inner thighs/perneum/abdominal pannus area: Overall patient is a stable.Suspect etiology is multifactorial with poor mobility morbid obesity incontinent of urine in the setting of chemotherapy. On admission had lactic acidosis that has resolved. Also with leukocytosis but also contributed by her Neulasta.  We will switch to Keflex, continue dressing. She had CT abdomen pelvis that showed equivocal mild wall thickening of the distal descending and proximal sigmoid colon but patient without adiarrhea. Keep Foley in place until the  skin healed well  Leukocytosis: Artifactual due to Neulasta and her cellulitis/infection.  Culture no growth so far.  WBC downtrending.  Check CBC in 1 week.  Recent Labs  Lab 03/07/20 0500 03/08/20 0557 03/09/20 0506 03/10/20 0612 03/11/20 0620  WBC 21.9* 19.8* 21.9* 20.6* 18.9*   Oral thrush s/p diflucan  Essential hypertension: BP stable on Coreg and hydralazine.   AKI: renal function remains stable. Creatinine 0.8 at baseline, peaked to 1.2 currently at 0.9.   Metabolic acidosis: Improved.  Folate deficiency/anemia of chronic disease hemoglobin 8.5: On admission 7.9 g and has decreased to 6.9, suspect multifactorial in the setting of cancer, folate deficiency. Status post 1 unit PRBC and Magrinat 8.1 g. Monitor intermittently. Continue folate supplementation.  Deconditioning/poor mobility/malaise weakness: Awaiting for skilled nursing facility.  T2 N1 stage IIb grade 3 ER positive breast cancer followed by medical oncology chemotherapy session 7/6 has been posted for 7/13 but patient was admitted. Seen by oncology who will arrange for outpatient follow-up after discharge. Appreciate input.  Gout continue colchicine.  Thrombocytopenia platelet counts are stable and normal  Hypomagnesemia: Repleted Mag-Ox.   Super morbid obesity with BMI 60. Follow-up outpatient for healthy lifestyle/weight loss strategy  Pressure ulcer present on admission on buttock as below: We will continue wound care 03/03/20 2308  Location: Buttocks  Location Orientation: Posterior  Staging: Stage 3 -  Full thickness tissue loss. Subcutaneous fat may be visible but bone, tendon or muscle are NOT exposed.  Wound Description (Comments):   Present on Admission: Yes     Pressure Injury 03/03/20 Buttocks Stage 3 -  Full thickness tissue loss. Subcutaneous fat may be visible but bone, tendon or muscle are NOT exposed. (  Active)  03/03/20 2308  Location: Buttocks  Location Orientation:   Staging: Stage 3  -  Full thickness tissue loss. Subcutaneous fat may be visible but bone, tendon or muscle are NOT exposed.  Wound Description (Comments):   Present on Admission: Yes   DVT prophylaxis: lovenox  Code Status: FULL Family Communication: plan of care discussed with patient at bedside.  Status is: Inpatient  Remains inpatient appropriate because:Unsafe d/c plan, IV treatments appropriate due to intensity of illness or inability to take PO and Inpatient level of care appropriate due to severity of illness   Dispo: The patient is from: Home              Anticipated d/c is to: SNF               Anticipated d/c date is: Once SNF approval is received              Patient currently is medically stable for discharge  Nutrition: Diet Order            DIET SOFT Room service appropriate? Yes; Fluid consistency: Thin  Diet effective now                 Body mass index is 60.46 kg/m. Consultants:see note  Procedures:see note Microbiology:see note  Medications: Scheduled Meds: . aspirin EC  81 mg Oral Daily  . carvedilol  12.5 mg Oral BID WC  . Chlorhexidine Gluconate Cloth  6 each Topical Daily  . colchicine  0.3 mg Oral QODAY  . enoxaparin (LOVENOX) injection  60 mg Subcutaneous Q24H  . folic acid  1 mg Oral Daily  . hydrALAZINE  25 mg Oral Q8H  . magnesium oxide  400 mg Oral BID  . valACYclovir  1,000 mg Oral BID   Continuous Infusions: . sodium chloride 10 mL/hr at 03/09/20 1708  .  ceFAZolin (ANCEF) IV 1 g (03/11/20 3016)    Antimicrobials: Anti-infectives (From admission, onward)   Start     Dose/Rate Route Frequency Ordered Stop   03/06/20 1500  fluconazole (DIFLUCAN) tablet 100 mg        100 mg Oral Daily 03/06/20 1406 03/10/20 1020   03/03/20 1400  ceFAZolin (ANCEF) IVPB 1 g/50 mL premix     Discontinue     1 g 100 mL/hr over 30 Minutes Intravenous Every 8 hours 03/03/20 1237     03/03/20 1115  valACYclovir (VALTREX) tablet 1,000 mg     Discontinue     1,000 mg Oral  2 times daily 03/03/20 1106       Objective: Vitals: Today's Vitals   03/10/20 2120 03/11/20 0606 03/11/20 0845 03/11/20 1257  BP: 119/61 (!) 121/58 124/71 108/62  Pulse: 87 84 87 82  Resp:  17  18  Temp: 97.9 F (36.6 C) 97.6 F (36.4 C)  97.9 F (36.6 C)  TempSrc: Oral Oral  Oral  SpO2: 98% 99%  100%  Weight:      Height:      PainSc:        Intake/Output Summary (Last 24 hours) at 03/11/2020 1422 Last data filed at 03/11/2020 0900 Gross per 24 hour  Intake 360 ml  Output 2425 ml  Net -2065 ml   Filed Weights   03/03/20 0942  Weight: (!) 145.2 kg   Weight change:    Intake/Output from previous day: 07/12 0701 - 07/13 0700 In: 360 [P.O.:360] Out: 2425 [Urine:2425] Intake/Output this shift: Total I/O In: 360 [P.O.:360] Out: -  Examination:  General exam: AAO x3, obese,NAD,weak appearing. HEENT:Oral mucosa moist, Ear/Nose WNL grossly, dentition normal. Respiratory system: bilaterally clear,no wheezing or crackles,no use of accessory muscle Cardiovascular system: S1 & S2 +, No JVD,. Gastrointestinal system: Abdomen soft, abdominal wall pannus with multiple skin wound/healing, BS difficult to auscultate due to obesity. Nervous System:Alert, awake, moving extremities and grossly nonfocal Extremities: No edema, distal peripheral pulses palpable.  Skin: No rashes,no icterus.  Skin breakdown in the thigh and perineum with dressing intact MSK: Normal muscle bulk,tone, power Foley+  Data Reviewed: I have personally reviewed following labs and imaging studies CBC: Recent Labs  Lab 03/05/20 0500 03/06/20 0543 03/07/20 0500 03/08/20 0557 03/09/20 0506 03/10/20 0612 03/11/20 0620  WBC 23.1*   < > 21.9* 19.8* 21.9* 20.6* 18.9*  NEUTROABS 18.2*  --   --   --   --   --   --   HGB 7.3*   < > 8.3* 7.7* 8.2* 8.0* 8.1*  HCT 22.3*   < > 25.7* 23.8* 25.2* 24.3* 25.0*  MCV 95.3   < > 93.8 94.1 94.0 93.5 95.1  PLT 164   < > 195 210 235 240 256   < > = values in this  interval not displayed.   Basic Metabolic Panel: Recent Labs  Lab 03/05/20 0500 03/05/20 0500 03/06/20 0543 03/06/20 0543 03/07/20 0500 03/08/20 0557 03/09/20 0506 03/10/20 0612 03/11/20 0620  NA 137   < > 139   < > 138 133* 137 137 138  K 3.8   < > 4.0   < > 3.9 3.5 3.8 4.1 4.0  CL 105   < > 107   < > 104 103 104 103 102  CO2 24   < > 24   < > 24 23 26 24 27   GLUCOSE 136*   < > 108*   < > 107* 95 96 95 101*  BUN 29*   < > 24*   < > 21 17 14 13 12   CREATININE 1.50*   < > 1.24*   < > 1.27* 1.02* 0.97 0.90 0.83  CALCIUM 8.4*   < > 8.5*   < > 8.5* 7.9* 8.2* 8.1* 8.2*  MG 1.9  --  1.6*  --   --   --   --   --   --   PHOS 3.0  --   --   --   --   --   --   --   --    < > = values in this interval not displayed.   GFR: Estimated Creatinine Clearance: 93.8 mL/min (by C-G formula based on SCr of 0.83 mg/dL). Liver Function Tests: Recent Labs  Lab 03/05/20 0500 03/06/20 0543 03/07/20 0500 03/08/20 0557  AST 14* 10* 13* 16  ALT 11 8 8 7   ALKPHOS 67 65 82 72  BILITOT 0.6 0.5 0.9 0.6  PROT 5.5* 5.1* 5.6* 5.1*  ALBUMIN 2.4* 2.1* 2.3* 2.1*   No results for input(s): LIPASE, AMYLASE in the last 168 hours. No results for input(s): AMMONIA in the last 168 hours. Coagulation Profile: No results for input(s): INR, PROTIME in the last 168 hours. Cardiac Enzymes: No results for input(s): CKTOTAL, CKMB, CKMBINDEX, TROPONINI in the last 168 hours. BNP (last 3 results) No results for input(s): PROBNP in the last 8760 hours. HbA1C: No results for input(s): HGBA1C in the last 72 hours. CBG: Recent Labs  Lab 03/10/20 0722  GLUCAP 108*   Lipid Profile: No results  for input(s): CHOL, HDL, LDLCALC, TRIG, CHOLHDL, LDLDIRECT in the last 72 hours. Thyroid Function Tests: No results for input(s): TSH, T4TOTAL, FREET4, T3FREE, THYROIDAB in the last 72 hours. Anemia Panel: No results for input(s): VITAMINB12, FOLATE, FERRITIN, TIBC, IRON, RETICCTPCT in the last 72 hours. Sepsis Labs: No  results for input(s): PROCALCITON, LATICACIDVEN in the last 168 hours.  Recent Results (from the past 240 hour(s))  Virus culture     Status: None   Collection Time: 03/03/20 10:21 AM   Specimen: Skin Scraping  Result Value Ref Range Status   Viral Culture No virus isolated.  Corrected    Comment: (NOTE) Please indicate source of specimen on all future request forms. Performed At: [facility] [address] [place], [place] [id number] Rush Farmer MD [id number] CORRECTED ON 07/13 AT 8341: PREVIOUSLY REPORTED AS Comment    Source of Sample VAGINA  Final    Comment: Performed at [facility], [place] [address] [place], [place] [address]  Culture, blood (routine x 2)     Status: None   Collection Time: 03/03/20 11:20 AM   Specimen: BLOOD  Result Value Ref Range Status   Specimen Description   Final    BLOOD RIGHT ANTECUBITAL Performed at [facility] [address], [place], [place] [address]    Special Requests   Final    BOTTLES DRAWN AEROBIC AND ANAEROBIC Blood Culture adequate volume Performed at [facility] [address], [place], [place] [address]    Culture   Final    NO GROWTH 5 DAYS Performed at [facility], [place] [address] [place], [place] [address]    Report Status 03/08/2020 FINAL  Final  Culture, blood (routine x 2)     Status: None   Collection Time: 03/03/20 11:20 AM   Specimen: BLOOD  Result Value Ref Range Status   Specimen Description   Final    BLOOD PORTA CATH Performed at [facility] [address] [place], [place] [address]    Special Requests   Final    BOTTLES DRAWN AEROBIC AND ANAEROBIC Blood Culture adequate volume Performed at [facility] [address] [place], [place] [address]    Culture   Final    NO GROWTH 5 DAYS Performed at [facility], [place] [address], [place], [place] [address]    Report Status 03/08/2020  FINAL  Final  Urine culture     Status: Abnormal   Collection Time: 03/03/20 11:20 AM   Specimen: Urine, Clean Catch  Result Value Ref Range Status   Specimen Description   Final    URINE, CLEAN CATCH Performed at [facility], [place] [address], [place], [place] [address]    Special Requests   Final    NONE Performed at [facility], [place] [address] [place], [place] [address]    Culture (A)  Final    <10,000 COLONIES/mL INSIGNIFICANT GROWTH Performed at [facility] [address] [place],  [address]    Report Status 03/04/2020 FINAL  Final  SARS Coronavirus 2 by RT PCR (hospital order, performed in [facility] hospital lab) Nasopharyngeal Nasopharyngeal Swab     Status: None   Collection Time: 03/03/20 11:20 AM   Specimen: Nasopharyngeal Swab  Result Value Ref Range Status   SARS Coronavirus 2 NEGATIVE NEGATIVE Final    Comment: (NOTE) SARS-CoV-2 target nucleic acids are NOT DETECTED.  The SARS-CoV-2 RNA  is generally detectable in upper and lower respiratory specimens during the acute phase of infection. The lowest concentration of SARS-CoV-2 viral copies this assay can detect is 250 copies / mL. A negative result does not preclude SARS-CoV-2 infection and should not be used as the sole basis for treatment or other patient management decisions.  A negative result may occur with improper specimen collection / handling, submission of specimen other than nasopharyngeal swab, presence of viral mutation(s) within the areas targeted by this assay, and inadequate number of viral copies (<250 copies / mL). A negative result must be combined with clinical observations, patient history, and epidemiological information.  Fact Sheet for Patients:   StrictlyIdeas.no  Fact Sheet for Healthcare Providers: BankingDealers.co.za  This test is not yet approved or  cleared by the [place]  FDA and has been authorized for detection and/or diagnosis of SARS-CoV-2 by FDA under an Emergency Use Authorization (EUA).  This EUA will remain in effect (meaning this test can be used) for the duration of the COVID-19 declaration under Section 564(b)(1) of the Act, 21 U.S.C. section 360bbb-3(b)(1), unless the authorization is terminated or revoked sooner.  Performed at [facility], [place] [address] [place], [place] [address]       Radiology Studies: No results found.   LOS: 8 days   Antonieta Pert, MD [facility]  03/11/2020, 2:22 PM

## 2020-03-11 NOTE — Telephone Encounter (Signed)
R/s appt per 7/12 sch msg - unable to reach pt . Left message for patient with appt date and time

## 2020-03-12 ENCOUNTER — Encounter: Payer: Self-pay | Admitting: *Deleted

## 2020-03-12 DIAGNOSIS — R238 Other skin changes: Secondary | ICD-10-CM | POA: Diagnosis not present

## 2020-03-12 DIAGNOSIS — N179 Acute kidney failure, unspecified: Secondary | ICD-10-CM | POA: Diagnosis not present

## 2020-03-12 DIAGNOSIS — I10 Essential (primary) hypertension: Secondary | ICD-10-CM | POA: Diagnosis not present

## 2020-03-12 DIAGNOSIS — D72829 Elevated white blood cell count, unspecified: Secondary | ICD-10-CM | POA: Diagnosis not present

## 2020-03-12 DIAGNOSIS — T451X5A Adverse effect of antineoplastic and immunosuppressive drugs, initial encounter: Secondary | ICD-10-CM | POA: Diagnosis not present

## 2020-03-12 DIAGNOSIS — D6481 Anemia due to antineoplastic chemotherapy: Secondary | ICD-10-CM | POA: Diagnosis not present

## 2020-03-12 NOTE — Progress Notes (Signed)
PROGRESS NOTE    [name]  GYI:[id number] DOB: 27-Aug-1956 DOA: 03/03/2020 PCP: [name], MD   Chef Complaints: Skin breakdown  Brief Narrative: 64 YO female with breast cancer status postmastectomy in April 2021 with negative margins undergoing adjuvant chemo Adriamycin plus cyclophosphamide last dose last week prior to admission, morbidly obese BMI 60, poorly mobile, lymphedema/venous insufficiency/CKD, hypertension recent admission for the inner thighs and perineum, who lives alone and a family member comes in to take care of her, has been incontinent with urine at all times noted to have a significant skin breakdown in the shower, sent to the ED for evaluation  In the ED found to have a lot of skin breakdown maceration several vesicles and also significant leukocytosis anemia, thrombocytopenia elevated BUN/creatinine of 1.6 up from 1.3 on June/30.  Foley catheter was placed, patient was admitted for IV antibiotics IV fluids hydration. Patient is followed by wound care to have Foley in place to allow for optimal wound healing.  Seen by PT OT skilled nursing facility has been recommended.  Initially reluctant and finally after talking with her oncologist agreed to go to SNF.  She is awaiting bed placement.  IV antibiotics has been changed to p.o. Wound care has reevaluated patient is more ambulatory at this time no need to continue Foley catheter and is being discontinued 7/19.  Completed p.o. antibiotics 7/20  Subjective: Resting comfortably on the bedside chair.  Nausea vomiting fever chills. Reports she spoke with her oncologist this am and if unable to find a skilled nursing facility soon, she will be going home with home health, reports that she will have a lady, 5 days a week and her niece will come on the weekend.  Assessment & Plan:  Multiple skin breakdown and cellulitis in the inner thighs/perneum/abdominal pannus area:Suspect etiology is multifactorial with poor mobility  morbid obesity incontinent of urine, in the setting of chemotherapy.  Initial lactic acidosis resolved, had leukocytosis and has nicely down trended.  At this time is afebrile, completed antibiotics today.  Her Foley has been discontinued.  Continue to keep the area dry, continue local dressing/powder application to keep the area dry underneath the skin fold in abdomen as well as on her inner thighs.  Continue wound care, appreciate wound care input.   Leukocytosis: Suspecting due to Neulasta and cellulitis.  Leukocytosis down trended.  Check CBC in a.m. . Recent Labs  Lab 03/14/20 1043  WBC 15.4*   Oral thrush received diflucan  Essential hypertension: BP controlled on Coreg and hydralazine.    JJK:KXFGHW to 1.2. AKI has resolved.   Metabolic acidosis:Improved.  Constipation: Improved.  Continue MiraLAX.   Folate deficiency anemia in the setting of anemia of chronic disease: On admission 7.9 g and was as low as 6.9, received 1 unit PRBC.  Globin overall stable.  Continue folate supplementation.   Recent Labs  Lab 03/14/20 1043  HGB 8.0*  HCT 25.3*   Deconditioning/poor mobility/malaise weakness: Cont PT/OT.  Pending SNF placement.   Lymphedema/chronic lower leg edema-stable.  T2 N1 stage IIb grade 3 ER positive breast cancer followed by medical oncology chemotherapy session 7/6 has been posted for 7/13 but patient was admitted. Seen by oncology who will arrange for outpatient follow-up after discharge.  Oncology has started her on anastrozole 7/17 advised to continue outpatient.  Gout-stable on colchicine.  Thrombocytopenia platelet: plt counts are stable. Hypomagnesemia: Repleted w/ Mag-Ox.   Super morbid obesity with BMI 60. Follow-up outpatient for healthy lifestyle/weight loss strategy  Pressure ulcer present on admission on buttock as below: We will continue wound care 03/03/20 2308  Location: Buttocks  Location Orientation: Posterior  Staging: Stage 3 -  Full thickness  tissue loss. Subcutaneous fat may be visible but bone, tendon or muscle are NOT exposed.  Wound Description (Comments):   Present on Admission: Yes     Pressure Injury 03/03/20 Buttocks Stage 3 -  Full thickness tissue loss. Subcutaneous fat may be visible but bone, tendon or muscle are NOT exposed. (Active)  03/03/20 2308  Location: Buttocks  Location Orientation:   Staging: Stage 3 -  Full thickness tissue loss. Subcutaneous fat may be visible but bone, tendon or muscle are NOT exposed.  Wound Description (Comments):   Present on Admission: Yes   DVT prophylaxis: lovenox  Code Status: FULL Family Communication: plan of care discussed with patient at bedside.  Status is: Inpatient  Remains inpatient appropriate because: Unsafe disposition plan, awaiting for skilled nursing facility placement.  Dispo: The patient is from: Home              Anticipated d/c is to: SNF               Anticipated d/c date is: Once SNF is available.  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              Patient currently is medically stable for discharge.  If unable to find SNF in next day or 2 plan is for return to home  With [facility]  Nutrition: Diet Order            DIET SOFT Room service appropriate? Yes; Fluid consistency: Thin  Diet effective now                 Body mass index is 60.46 kg/m. Consultants:see note  Procedures:see note Microbiology:see note  Medications: Scheduled Meds: . anastrozole  1 mg Oral Daily  . aspirin EC  81 mg Oral Daily  . carvedilol  12.5 mg Oral BID WC  . Chlorhexidine Gluconate Cloth  6 each Topical Daily  . colchicine  0.3 mg Oral QODAY  . enoxaparin (LOVENOX) injection  60 mg Subcutaneous Q24H  . folic acid  1 mg Oral Daily  . hydrALAZINE  25 mg Oral Q8H  . polyethylene glycol  17 g Oral BID   Continuous Infusions: . sodium chloride 10 mL/hr at 03/09/20 1708    Antimicrobials: Anti-infectives (From admission, onward)   Start     Dose/Rate Route Frequency  Ordered Stop   03/17/20 1800  cephALEXin (KEFLEX) capsule 500 mg        500 mg Oral Every 6 hours 03/17/20 1335 03/18/20 1208   03/11/20 2000  cephALEXin (KEFLEX) capsule 500 mg  Status:  Discontinued        500 mg Oral Every 6 hours 03/11/20 1430 03/17/20 1335   03/06/20 1500  fluconazole (DIFLUCAN) tablet 100 mg        100 mg Oral Daily 03/06/20 1406 03/10/20 1020   03/03/20 1400  ceFAZolin (ANCEF) IVPB 1 g/50 mL premix  Status:  Discontinued        1 g 100 mL/hr over 30 Minutes Intravenous Every 8 hours 03/03/20 1237 03/11/20 1430   03/03/20 1115  valACYclovir (VALTREX) tablet 1,000 mg  Status:  Discontinued        1,000 mg Oral 2 times daily 03/03/20 1106 03/17/20 1335     Objective: Vitals: Today's Vitals   03/17/20 2107 03/18/20 0515 03/18/20  0804 03/18/20 1252  BP: 121/61 117/65  120/72  Pulse: 91 88  83  Resp: 18 18  16   Temp: 98.4 F (36.9 C) 98.1 F (36.7 C)  98.2 F (36.8 C)  TempSrc: Oral Oral  Oral  SpO2: 98% 97%  99%  Weight:      Height:      PainSc:   0-No pain     Intake/Output Summary (Last 24 hours) at 03/18/2020 1456 Last data filed at 03/17/2020 1630 Gross per 24 hour  Intake --  Output 700 ml  Net -700 ml   Filed Weights   03/03/20 0942  Weight: (!) 145.2 kg   Weight change:    Intake/Output from previous day: 07/19 0701 - 07/20 0700 In: -  Out: 2550 [Urine:2550] Intake/Output this shift: No intake/output data recorded.  Examination:  General exam: AAO x3, NAD, weak appearing.  Orbitally obese.  Pleasant. HEENT:Oral mucosa moist, Ear/Nose WNL grossly, dentition normal. Respiratory system: bilaterally clear,no wheezing or crackles,no use of accessory muscle Cardiovascular system: S1 & S2 +, No JVD,. Gastrointestinal system: Abdomen soft, obese abdomen with skin fold area underneath moist and multiple areas of cyst/sebum gland, healing wound Nervous System:Alert, awake, moving extremities and grossly nonfocal Extremities: No edema, distal  peripheral pulses palpable.  Skin: Skin breakdown underneath the inner thighs.No icterus. MSK: Normal muscle bulk,tone, power  Data Reviewed: I have personally reviewed following labs and imaging studies CBC: Recent Labs  Lab 03/14/20 1043  WBC 15.4*  HGB 8.0*  HCT 25.3*  MCV 96.9  PLT 832   Basic Metabolic Panel: Recent Labs  Lab 03/14/20 1043 03/17/20 0535  NA 135  --   K 4.9  --   CL 102  --   CO2 26  --   GLUCOSE 119*  --   BUN 16  --   CREATININE 1.04* 0.96  CALCIUM 8.3*  --    GFR: Estimated Creatinine Clearance: 81.1 mL/min (by C-G formula based on SCr of 0.96 mg/dL). Liver Function Tests: No results for input(s): AST, ALT, ALKPHOS, BILITOT, PROT, ALBUMIN in the last 168 hours. No results for input(s): LIPASE, AMYLASE in the last 168 hours. No results for input(s): AMMONIA in the last 168 hours. Coagulation Profile: No results for input(s): INR, PROTIME in the last 168 hours. Cardiac Enzymes: No results for input(s): CKTOTAL, CKMB, CKMBINDEX, TROPONINI in the last 168 hours. BNP (last 3 results) No results for input(s): PROBNP in the last 8760 hours. HbA1C: No results for input(s): HGBA1C in the last 72 hours. CBG: Recent Labs  Lab 03/14/20 0714  GLUCAP 83   Lipid Profile: No results for input(s): CHOL, HDL, LDLCALC, TRIG, CHOLHDL, LDLDIRECT in the last 72 hours. Thyroid Function Tests: No results for input(s): TSH, T4TOTAL, FREET4, T3FREE, THYROIDAB in the last 72 hours. Anemia Panel: No results for input(s): VITAMINB12, FOLATE, FERRITIN, TIBC, IRON, RETICCTPCT in the last 72 hours. Sepsis Labs: No results for input(s): PROCALCITON, LATICACIDVEN in the last 168 hours.  No results found for this or any previous visit (from the past 240 hour(s)).   Radiology Studies: No results found.   LOS: 15 days   Antonieta Pert, MD [facility]  03/18/2020, 2:56 PM

## 2020-03-12 NOTE — Progress Notes (Signed)
Physical Therapy Treatment Patient Details Name: [name] MRN: [id number] DOB: 09-Nov-1955 Today's Date: 03/12/2020    History of Present Illness 64 yo female admitted with skin breakdown/cellulitis, weakness, difficulty functioning at home. Hx of breast ca s/p mastectomy, lymphedema, poor wound healing, venous insufficiency, CKD, morbid obesity, falls    PT Comments    Patient making good progress with acute PT and remains highly motivated to participate. Patient able to complete supine to sit transfer with min assist and guarding +2 at EOB for safety. Patient required VC's and min assist +2 for transfers and short bouts of gait in room with RW. She continues to fatigue quickly and requires extended seated rest breaks to complete exercises and activities. Patient will continue to benefit from skilled PT interventions to address impairments and progress mobility.    Follow Up Recommendations  SNF     Equipment Recommendations  None recommended by PT    Recommendations for Other Services       Precautions / Restrictions Precautions Precautions: Fall Precaution Comments: skin breakdown 2* body habitus Restrictions Weight Bearing Restrictions: No Other Position/Activity Restrictions: BMI 60 height 5'1"    Mobility  Bed Mobility Overal bed mobility: Needs Assistance Bed Mobility: Supine to Sit     Supine to sit: Min assist;+2 for safety/equipment;HOB elevated     General bed mobility comments: Min assist for trunk position/raising and to safely birng LE's to EOB. +2 for safety.  Transfers Overall transfer level: Needs assistance Equipment used: Rolling walker (2 wheeled) Transfers: Sit to/from [facility] Sit to Stand: Min assist;+2 safety/equipment Stand pivot transfers: Min assist;+2 safety/equipment       General transfer comment: Min assit +2 for safety and guarding. cues for technique with safe hand placement for power up. Pt performed 1x from  EOB and min assist for walker management to step to recliner. 2x from recliner for short bouts of gait.   Ambulation/Gait Ambulation/Gait assistance: Min assist;+2 safety/equipment Gait Distance (Feet): 10 Feet (2x10) Assistive device: Rolling walker (2 wheeled) Gait Pattern/deviations: Step-through pattern;Decreased step length - right;Decreased step length - left;Decreased stride length;Wide base of support Gait velocity: decr   General Gait Details: VC's for safe proximity to RW and clsoe gaurd/min assist for safety. pt amb short distance 2x in room with extended seated rest breaks between as she fatigues quickly.    Stairs      Wheelchair Mobility    Modified Rankin (Stroke Patients Only)       Balance Overall balance assessment: Needs assistance Sitting-balance support: Feet supported Sitting balance-Leahy Scale: Good Sitting balance - Comments: pt complete functional UE exercise with OT and maintained balance while sitting without back support and tossing pillow.   Standing balance support: Bilateral upper extremity supported Standing balance-Leahy Scale: Poor Standing balance comment: reliant on external support           Cognition Arousal/Alertness: Awake/alert Behavior During Therapy: WFL for tasks assessed/performed Overall Cognitive Status: Within Functional Limits for tasks assessed           Exercises Other Exercises Other Exercises: Psuedo Ball Toss w/ Pillow (to promote shoulder elevation and upper extremity movement) x 10, then x 20    General Comments        Pertinent Vitals/Pain Pain Assessment: No/denies pain           PT Goals (current goals can now be found in the care plan section) Acute Rehab PT Goals Patient Stated Goal: To get out of the bed PT  Goal Formulation: With patient Time For Goal Achievement: 03/18/20 Potential to Achieve Goals: Fair Progress towards PT goals: Progressing toward goals    Frequency    Min  3X/week      PT Plan Current plan remains appropriate    Co-evaluation PT/OT/SLP Co-Evaluation/Treatment: Yes Reason for Co-Treatment: For patient/therapist safety;To address functional/ADL transfers PT goals addressed during session: Mobility/safety with mobility;Balance;Proper use of DME OT goals addressed during session: Strengthening/ROM      AM-PAC PT "6 Clicks" Mobility   Outcome Measure  Help needed turning from your back to your side while in a flat bed without using bedrails?: A Little Help needed moving from lying on your back to sitting on the side of a flat bed without using bedrails?: A Little Help needed moving to and from a bed to a chair (including a wheelchair)?: A Little Help needed standing up from a chair using your arms (e.g., wheelchair or bedside chair)?: A Little Help needed to walk in hospital room?: A Little Help needed climbing 3-5 steps with a railing? : Total 6 Click Score: 16    End of Session   Activity Tolerance: Patient tolerated treatment well Patient left: in chair;with call bell/phone within reach Nurse Communication: Mobility status PT Visit Diagnosis: Muscle weakness (generalized) (M62.81);Pain;Other abnormalities of gait and mobility (R26.89)     Time: 1431-1500 PT Time Calculation (min) (ACUTE ONLY): 29 min  Charges:  $Gait Training: 8-22 mins                     Verner Mould, DPT Acute Rehabilitation Services  Office [phone] Pager [phone]  03/12/2020 6:39 PM

## 2020-03-12 NOTE — TOC Progression Note (Signed)
Transition of Care [facility]) - Progression Note    Patient Details  Name: [name] MRN: [id number] Date of Birth: 1955/11/02  [facility] CM/SW Contact  Baneen Wieseler, Marjie Skiff, RN Phone Number: 03/12/2020, 10:20 AM  Clinical Narrative:     This CM contacted Debbie from [facility] Insurance auth still not back from submitting on 7/12. TOC will continue to follow.  Expected Discharge Plan: Skilled Nursing Facility Barriers to Discharge: Continued Medical Work up  Expected Discharge Plan and Services Expected Discharge Plan: [place]   Discharge Planning Services: CM Consult   Living arrangements for the past 2 months: Single Family Home                                       Social Determinants of Health (SDOH) Interventions    Readmission Risk Interventions Readmission Risk Prevention Plan 03/06/2020 02/13/2020  Transportation Screening Complete Complete  PCP or Specialist Appt within 3-5 Days Not Complete -  Not Complete comments Unsure of dc date at this time -  [place] or [place] Complete -  Social Work Consult for [place] Planning/Counseling Complete -  Palliative Care Screening Not Applicable -  Medication Review [employer]) Complete Complete  PCP or Specialist appointment within 3-5 days of discharge - Complete  [place] or [place] - Complete  SW Recovery Care/Counseling Consult - Complete  [place] - Not Applicable  Some recent data might be hidden

## 2020-03-12 NOTE — Progress Notes (Signed)
Occupational Therapy Treatment Patient Details Name: [name] MRN: [id number] DOB: 17-Mar-1956 Today's Date: 03/12/2020    History of present illness 64 yo female admitted with skin breakdown/cellulitis, weakness, difficulty functioning at home. Hx of breast ca s/p mastectomy, lymphedema, poor wound healing, venous insufficiency, CKD, morbid obesity, falls   OT comments  Ms. [name] demonstrated ability to perform supine to sit with min assist and use of bed rails to transfer to side of bed. Today patient able to stand from bed height, transfer to recliner, ambulate in room with RW x 2 and perform "ball toss" activity with pillow to promote strength and endurance of upper extremities. Patient tolerated well with rest breaks. Patient pleasant and highly motivated.   Follow Up Recommendations  SNF;Supervision/Assistance - 24 hour    Equipment Recommendations  Other (comment)    Recommendations for Other Services      Precautions / Restrictions Precautions Precautions: Fall Restrictions Other Position/Activity Restrictions: BMI 60 height 5'1"       Mobility Bed Mobility Overal bed mobility: Needs Assistance       Supine to sit: Min assist;+2 for safety/equipment;HOB elevated     General bed mobility comments: Min assist to transfer to side of bed for light lift of LEs to get over mattress. Otherwise patient able to perform with use of bed rails.  Transfers   Equipment used: Rolling walker (2 wheeled) Transfers: Sit to/from Stand Sit to Stand: [employer] safety/equipment         General transfer comment: Min assist to stand from bed with RW (+2 min guard for safety), +2 min guard to transfer to recliner and ambulate forward with RW x 2    Balance Overall balance assessment: Mild deficits observed, not formally tested                                         ADL either performed or assessed with clinical judgement   ADL                                                Vision       Perception     Praxis      Cognition Arousal/Alertness: Awake/alert;Lethargic Behavior During Therapy: WFL for tasks assessed/performed Overall Cognitive Status: Within Functional Limits for tasks assessed                                          Exercises Other Exercises Other Exercises: Psuedo Ball Toss w/ Pillow (to promote shoulder elevation and upper extremity movement) x 10, then x 20   Shoulder Instructions       General Comments      Pertinent Vitals/ Pain       Pain Assessment: No/denies pain  Home Living                                          Prior Functioning/Environment              Frequency  Min 2X/week  Progress Toward Goals  OT Goals(current goals can now be found in the care plan section)  Progress towards OT goals: Progressing toward goals  Acute Rehab OT Goals Patient Stated Goal: To get out of the bed OT Goal Formulation: With patient Time For Goal Achievement: 03/19/20 Potential to Achieve Goals: Good  Plan Discharge plan remains appropriate    Co-evaluation    PT/OT/SLP Co-Evaluation/Treatment: Yes Reason for Co-Treatment: For patient/therapist safety;To address functional/ADL transfers   OT goals addressed during session: Strengthening/ROM      AM-PAC OT "6 Clicks" Daily Activity     Outcome Measure   Help from another person eating meals?: None Help from another person taking care of personal grooming?: A Little Help from another person toileting, which includes using toliet, bedpan, or urinal?: A Lot Help from another person bathing (including washing, rinsing, drying)?: A Lot Help from another person to put on and taking off regular upper body clothing?: A Lot Help from another person to put on and taking off regular lower body clothing?: A Lot 6 Click Score: 15    End of Session Equipment Utilized During  Treatment: Rolling walker  OT Visit Diagnosis: Unsteadiness on feet (R26.81);Other abnormalities of gait and mobility (R26.89);Pain   Activity Tolerance Patient tolerated treatment well   Patient Left in chair;with call bell/phone within reach   Nurse Communication Mobility status        Time: 1431-1500 OT Time Calculation (min): 29 min  Charges: OT General Charges $OT Visit: 1 Visit OT Treatments $Therapeutic Activity: 8-22 mins  Derl Barrow, OTR/L [place]  Office [phone] Pager: [place] 03/12/2020, 4:17 PM

## 2020-03-12 NOTE — Progress Notes (Signed)
PROGRESS NOTE    [name]  HYW:[id number] DOB: 09-01-1955 DOA: 03/03/2020 PCP: [name], MD   Chef Complaints: Skin breakdown  Brief Narrative: 64 YO female with breast cancer status postmastectomy in April 2021 with negative margins undergoing adjuvant chemo Adriamycin plus cyclophosphamide last dose last week prior to admission, morbidly obese BMI 60, poorly mobile, lymphedema/venous insufficiency/CKD, hypertension recent admission for the inner thighs and perineum, who lives alone and a family member comes in to take care of her, has been incontinent with urine at all times noted to have a significant skin breakdown in the shower, sent to the ED for evaluation  In the ED found to have a lot of skin breakdown maceration several vesicles and also significant leukocytosis anemia, thrombocytopenia elevated BUN/creatinine of 1.6 up from 1.3 on June/30.  Foley catheter was placed, patient was admitted for IV antibiotics IV fluids hydration Patient is followed by wound care, wound is healing well.  She is on IV antibiotics. She remains deconditioned weak seen by PT and SNF has been advised initially reluctant however after discussion with her oncologist. she has agreed to go to skilled nursing rehab.  Subjective: Resting comfortably.  No new complaints. The wound is getting better and she is getting good wound care. She feels better with the Foley in place and she is happy that she is able to keep the perineal area dry  Assessment & Plan:  Multiple skin breakdown and cellulitis in the inner thighs/perneum/abdominal pannus area: Overall patient is a stable.Suspect etiology is multifactorial with poor mobility morbid obesity incontinent of urine in the setting of chemotherapy. On admission had lactic acidosis that has resolved. Also with leukocytosis but also contributed by her Neulasta.  Continue on oral Keflex continue wound dressing local wound care and Foley as above. She had CT abdomen  pelvis that showed equivocal mild wall thickening of the distal descending and proximal sigmoid colon but patient without adiarrhea. Keep Foley in place until the skin healed well  Leukocytosis: Artifactual due to Neulasta and her cellulitis/infection.  Downtrending.  Follow-up CBC in 1 week at the SNF. Recent Labs  Lab 03/07/20 0500 03/08/20 0557 03/09/20 0506 03/10/20 0612 03/11/20 0620  WBC 21.9* 19.8* 21.9* 20.6* 18.9*   Oral thrush s/p diflucan  Essential hypertension: BP is controlled on Coreg and hydralazine.    SWN:IOEVOJ to 1.2-stable.  Metabolic acidosis:Improved.  Folate deficiency/anemia of chronic disease hemoglobin 8.5: On admission 7.9 g and has decreased to 6.9, suspect multifactorial in the setting of cancer, folate deficiency.  Received 1 unit PRBC, monitor hemoglobin.   Continue folate supplementation.  Deconditioning/poor mobility/malaise weakness: We will need a skilled nursing facility.  T2 N1 stage IIb grade 3 ER positive breast cancer followed by medical oncology chemotherapy session 7/6 has been posted for 7/13 but patient was admitted. Seen by oncology who will arrange for outpatient follow-up after discharge. Appreciate input.  Gout continue colchicine.  Thrombocytopenia platelet counts are stable and normal  Hypomagnesemia: Repleted Mag-Ox.   Super morbid obesity with BMI 60. Follow-up outpatient for healthy lifestyle/weight loss strategy  Pressure ulcer present on admission on buttock as below: We will continue wound care 03/03/20 2308  Location: Buttocks  Location Orientation: Posterior  Staging: Stage 3 -  Full thickness tissue loss. Subcutaneous fat may be visible but bone, tendon or muscle are NOT exposed.  Wound Description (Comments):   Present on Admission: Yes     Pressure Injury 03/03/20 Buttocks Stage 3 -  Full thickness tissue loss.  Subcutaneous fat may be visible but bone, tendon or muscle are NOT exposed. (Active)  03/03/20 2308   Location: Buttocks  Location Orientation:   Staging: Stage 3 -  Full thickness tissue loss. Subcutaneous fat may be visible but bone, tendon or muscle are NOT exposed.  Wound Description (Comments):   Present on Admission: Yes   DVT prophylaxis: lovenox  Code Status: FULL Family Communication: plan of care discussed with patient at bedside.  Status is: Inpatient  Remains inpatient appropriate because: Unsafe disposition plan, awaiting for skilled nursing facility placement.   Dispo: The patient is from: Home              Anticipated d/c is to: SNF               Anticipated d/c date is: Once SNF approval is received              Patient currently is medically stable for discharge  Nutrition: Diet Order            DIET SOFT Room service appropriate? Yes; Fluid consistency: Thin  Diet effective now                 Body mass index is 60.46 kg/m. Consultants:see note  Procedures:see note Microbiology:see note  Medications: Scheduled Meds: . aspirin EC  81 mg Oral Daily  . carvedilol  12.5 mg Oral BID WC  . cephALEXin  500 mg Oral Q6H  . Chlorhexidine Gluconate Cloth  6 each Topical Daily  . colchicine  0.3 mg Oral QODAY  . enoxaparin (LOVENOX) injection  60 mg Subcutaneous Q24H  . folic acid  1 mg Oral Daily  . hydrALAZINE  25 mg Oral Q8H  . magnesium oxide  400 mg Oral BID  . valACYclovir  1,000 mg Oral BID   Continuous Infusions: . sodium chloride 10 mL/hr at 03/09/20 1708    Antimicrobials: Anti-infectives (From admission, onward)   Start     Dose/Rate Route Frequency Ordered Stop   03/11/20 2000  cephALEXin (KEFLEX) capsule 500 mg     Discontinue     500 mg Oral Every 6 hours 03/11/20 1430     03/06/20 1500  fluconazole (DIFLUCAN) tablet 100 mg        100 mg Oral Daily 03/06/20 1406 03/10/20 1020   03/03/20 1400  ceFAZolin (ANCEF) IVPB 1 g/50 mL premix  Status:  Discontinued        1 g 100 mL/hr over 30 Minutes Intravenous Every 8 hours 03/03/20 1237  03/11/20 1430   03/03/20 1115  valACYclovir (VALTREX) tablet 1,000 mg     Discontinue     1,000 mg Oral 2 times daily 03/03/20 1106       Objective: Vitals: Today's Vitals   03/11/20 2155 03/12/20 0559 03/12/20 0743 03/12/20 1239  BP: (!) 115/56 (!) 110/52  (!) 120/59  Pulse: 87 96  87  Resp:    18  Temp: 98.2 F (36.8 C) 98.8 F (37.1 C)  97.6 F (36.4 C)  TempSrc: Oral Oral  Oral  SpO2: 98% 98%  100%  Weight:      Height:      PainSc:   0-No pain     Intake/Output Summary (Last 24 hours) at 03/12/2020 1323 Last data filed at 03/12/2020 0605 Gross per 24 hour  Intake --  Output 375 ml  Net -375 ml   Filed Weights   03/03/20 0942  Weight: (!) 145.2 kg   Weight change:  Intake/Output from previous day: 07/13 0701 - 07/14 0700 In: 360 [P.O.:360] Out: 375 [Urine:375] Intake/Output this shift: No intake/output data recorded.  Examination:  General exam: AAOx3 , NAD, weak appearing. HEENT:Oral mucosa moist, Ear/Nose WNL grossly, dentition normal. Respiratory system: bilaterally clear,no wheezing or crackles,no use of accessory muscle Cardiovascular system: S1 & S2 +, No JVD,. Gastrointestinal system: Abdomen soft, abdominal wall pannus with multiple healing skin breakdown/wound, nontender. Perineal area with dressing intact for skin breakdown.  Foley in place.  Nervous System:Alert, awake, moving extremities and grossly nonfocal Extremities: No edema, distal peripheral pulses palpable.  Skin: No rashes,no icterus. MSK: Normal muscle bulk,tone, power  Data Reviewed: I have personally reviewed following labs and imaging studies CBC: Recent Labs  Lab 03/07/20 0500 03/08/20 0557 03/09/20 0506 03/10/20 0612 03/11/20 0620  WBC 21.9* 19.8* 21.9* 20.6* 18.9*  HGB 8.3* 7.7* 8.2* 8.0* 8.1*  HCT 25.7* 23.8* 25.2* 24.3* 25.0*  MCV 93.8 94.1 94.0 93.5 95.1  PLT 195 210 235 240 762   Basic Metabolic Panel: Recent Labs  Lab 03/06/20 0543 03/06/20 0543  03/07/20 0500 03/08/20 0557 03/09/20 0506 03/10/20 0612 03/11/20 0620  NA 139   < > 138 133* 137 137 138  K 4.0   < > 3.9 3.5 3.8 4.1 4.0  CL 107   < > 104 103 104 103 102  CO2 24   < > 24 23 26 24 27   GLUCOSE 108*   < > 107* 95 96 95 101*  BUN 24*   < > 21 17 14 13 12   CREATININE 1.24*   < > 1.27* 1.02* 0.97 0.90 0.83  CALCIUM 8.5*   < > 8.5* 7.9* 8.2* 8.1* 8.2*  MG 1.6*  --   --   --   --   --   --    < > = values in this interval not displayed.   GFR: Estimated Creatinine Clearance: 93.8 mL/min (by C-G formula based on SCr of 0.83 mg/dL). Liver Function Tests: Recent Labs  Lab 03/06/20 0543 03/07/20 0500 03/08/20 0557  AST 10* 13* 16  ALT 8 8 7   ALKPHOS 65 82 72  BILITOT 0.5 0.9 0.6  PROT 5.1* 5.6* 5.1*  ALBUMIN 2.1* 2.3* 2.1*   No results for input(s): LIPASE, AMYLASE in the last 168 hours. No results for input(s): AMMONIA in the last 168 hours. Coagulation Profile: No results for input(s): INR, PROTIME in the last 168 hours. Cardiac Enzymes: No results for input(s): CKTOTAL, CKMB, CKMBINDEX, TROPONINI in the last 168 hours. BNP (last 3 results) No results for input(s): PROBNP in the last 8760 hours. HbA1C: No results for input(s): HGBA1C in the last 72 hours. CBG: Recent Labs  Lab 03/10/20 0722  GLUCAP 108*   Lipid Profile: No results for input(s): CHOL, HDL, LDLCALC, TRIG, CHOLHDL, LDLDIRECT in the last 72 hours. Thyroid Function Tests: No results for input(s): TSH, T4TOTAL, FREET4, T3FREE, THYROIDAB in the last 72 hours. Anemia Panel: No results for input(s): VITAMINB12, FOLATE, FERRITIN, TIBC, IRON, RETICCTPCT in the last 72 hours. Sepsis Labs: No results for input(s): PROCALCITON, LATICACIDVEN in the last 168 hours.  Recent Results (from the past 240 hour(s))  Virus culture     Status: None   Collection Time: 03/03/20 10:21 AM   Specimen: Skin Scraping  Result Value Ref Range Status   Viral Culture No virus isolated.  Corrected    Comment:  (NOTE) Please indicate source of specimen on all future request forms. Performed At: [facility]  [place] [address] [place], [place] [id number] Rush Farmer MD VO:[id number] CORRECTED ON 07/13 AT 6948: PREVIOUSLY REPORTED AS Comment    Source of Sample VAGINA  Final    Comment: Performed at [place] [address] [place], [place] [address]  Culture, blood (routine x 2)     Status: None   Collection Time: 03/03/20 11:20 AM   Specimen: BLOOD  Result Value Ref Range Status   Specimen Description   Final    BLOOD RIGHT ANTECUBITAL Performed at [place] [address], [place], [place] [address]    Special Requests   Final    BOTTLES DRAWN AEROBIC AND ANAEROBIC Blood Culture adequate volume Performed at [facility] [address] [place], [place] [address]    Culture   Final    NO GROWTH 5 DAYS Performed at [facility], [place] [address] [place], [place] [address]    Report Status 03/08/2020 FINAL  Final  Culture, blood (routine x 2)     Status: None   Collection Time: 03/03/20 11:20 AM   Specimen: BLOOD  Result Value Ref Range Status   Specimen Description   Final    BLOOD PORTA CATH Performed at [place] [address], [place], [place] [address]    Special Requests   Final    BOTTLES DRAWN AEROBIC AND ANAEROBIC Blood Culture adequate volume Performed at [facility] [address], [place], [place] [address]    Culture   Final    NO GROWTH 5 DAYS Performed at [facility] [place] [facility], [place] [address] [place], [place] [address]    Report Status 03/08/2020 FINAL  Final  Urine culture     Status: Abnormal   Collection Time: 03/03/20 11:20 AM   Specimen: Urine, Clean Catch  Result Value Ref Range Status   Specimen Description   Final    URINE, CLEAN CATCH Performed at [facility], [place] [address] [place], [place] [address]     Special Requests   Final    NONE Performed at [facility], [place] [address] [place], [place] [address]    Culture (A)  Final    <10,000 COLONIES/mL INSIGNIFICANT GROWTH Performed at [place] [address] [place],  [address]    Report Status 03/04/2020 FINAL  Final  SARS Coronavirus 2 by RT PCR (hospital order, performed in [facility] hospital lab) Nasopharyngeal Nasopharyngeal Swab     Status: None   Collection Time: 03/03/20 11:20 AM   Specimen: Nasopharyngeal Swab  Result Value Ref Range Status   SARS Coronavirus 2 NEGATIVE NEGATIVE Final    Comment: (NOTE) SARS-CoV-2 target nucleic acids are NOT DETECTED.  The SARS-CoV-2 RNA is generally detectable in upper and lower respiratory specimens during the acute phase of infection. The lowest concentration of SARS-CoV-2 viral copies this assay can detect is 250 copies / mL. A negative result does not preclude SARS-CoV-2 infection and should not be used as the sole basis for treatment or other patient management decisions.  A negative result may occur with improper specimen collection / handling, submission of specimen other than nasopharyngeal swab, presence of viral mutation(s) within the areas targeted by this assay, and inadequate number of viral copies (<250 copies / mL). A negative result must be combined with clinical observations, patient history, and epidemiological information.  Fact Sheet for Patients:   StrictlyIdeas.no  Fact Sheet for Healthcare Providers: BankingDealers.co.za  This test is not yet approved or  cleared by the [place] and has been authorized for detection and/or diagnosis of SARS-CoV-2 by FDA under an Emergency Use Authorization (EUA).  This EUA will remain in effect (meaning this test can be used) for the duration of the COVID-19 declaration under Section 564(b)(1) of the Act, 21 U.S.C. section  360bbb-3(b)(1), unless the authorization is terminated or revoked sooner.  Performed at [facility], [place] [address] [place], [place] [address]       Radiology Studies: No results found.   LOS: 9 days   Antonieta Pert, MD [facility]  03/12/2020, 1:23 PM

## 2020-03-13 ENCOUNTER — Ambulatory Visit: Payer: Medicaid Other

## 2020-03-13 DIAGNOSIS — D6481 Anemia due to antineoplastic chemotherapy: Secondary | ICD-10-CM | POA: Diagnosis not present

## 2020-03-13 DIAGNOSIS — I10 Essential (primary) hypertension: Secondary | ICD-10-CM | POA: Diagnosis not present

## 2020-03-13 DIAGNOSIS — N179 Acute kidney failure, unspecified: Secondary | ICD-10-CM | POA: Diagnosis not present

## 2020-03-13 DIAGNOSIS — R238 Other skin changes: Secondary | ICD-10-CM | POA: Diagnosis not present

## 2020-03-13 DIAGNOSIS — D72829 Elevated white blood cell count, unspecified: Secondary | ICD-10-CM | POA: Diagnosis not present

## 2020-03-13 DIAGNOSIS — T451X5A Adverse effect of antineoplastic and immunosuppressive drugs, initial encounter: Secondary | ICD-10-CM | POA: Diagnosis not present

## 2020-03-13 MED FILL — Dexamethasone Sodium Phosphate Inj 100 MG/10ML: INTRAMUSCULAR | Qty: 2 | Status: AC

## 2020-03-13 NOTE — Progress Notes (Signed)
PROGRESS NOTE    [name]  NOI:[id number] DOB: [birth year] DOA: 03/03/2020 PCP: [name], MD   Chef Complaints: Skin breakdown  Brief Narrative: 64 YO female with breast cancer status postmastectomy in April 2021 with negative margins undergoing adjuvant chemo Adriamycin plus cyclophosphamide last dose last week prior to admission, morbidly obese BMI 60, poorly mobile, lymphedema/venous insufficiency/CKD, hypertension recent admission for the inner thighs and perineum, who lives alone and a family member comes in to take care of her, has been incontinent with urine at all times noted to have a significant skin breakdown in the shower, sent to the ED for evaluation  In the ED found to have a lot of skin breakdown maceration several vesicles and also significant leukocytosis anemia, thrombocytopenia elevated BUN/creatinine of 1.6 up from 1.3 on June/30.  Foley catheter was placed, patient was admitted for IV antibiotics IV fluids hydration Patient is followed by wound care, wound is healing well.  She is on IV antibiotics. She remains deconditioned weak seen by PT and SNF has been advised initially reluctant however after discussion with her oncologist. she has agreed to go to skilled nursing rehab. Wound is overall improving with current therapy.  She is awaiting for skilled nursing facility placement.  Subjective:  No new complaints.  Pain is controlled.   She reports her wounds are improving nicely on the back inner thigh and abdomen   Assessment & Plan:  Multiple skin breakdown and cellulitis in the inner thighs/perneum/abdominal pannus area: Overall patient is a stable.Suspect etiology is multifactorial with poor mobility morbid obesity incontinent of urine in the setting of chemotherapy. On admission had lactic acidosis that has resolved. Also with leukocytosis but also contributed by her Neulasta.  Continue oral Keflex, dressing change wound care keep the area dry with Foley.    Leukocytosis: Artifactual due to Neulasta and her cellulitis/infection.  WBC downtrending.  Follow-up CBC in few days. Recent Labs  Lab 03/07/20 0500 03/08/20 0557 03/09/20 0506 03/10/20 0612 03/11/20 0620  WBC 21.9* 19.8* 21.9* 20.6* 18.9*   Oral thrush s/p diflucan  Essential hypertension: BP is controlled on Coreg and hydralazine.    QXI:HWTUUE to 1.2-AKI is resolved.  Metabolic acidosis:Improved.  Folate deficiency/anemia of chronic disease hemoglobin 8.5: On admission 7.9 g and has decreased to 6.9, suspect multifactorial in the setting of cancer, folate deficiency.  Received 1 unit PRBC, monitor hemoglobin.   Continue folate supplementation.  Monitor CBC intermittently.  Deconditioning/poor mobility/malaise weakness: Awaiting for skilled nursing facility.  T2 N1 stage IIb grade 3 ER positive breast cancer followed by medical oncology chemotherapy session 7/6 has been posted for 7/13 but patient was admitted. Seen by oncology who will arrange for outpatient follow-up after discharge. Appreciate input.  Gout continue colchicine.  Thrombocytopenia platelet counts are stable and normal  Hypomagnesemia: Repleted Mag-Ox.   Super morbid obesity with BMI 60. Follow-up outpatient for healthy lifestyle/weight loss strategy  Pressure ulcer present on admission on buttock as below: We will continue wound care 03/03/20 2308  Location: Buttocks  Location Orientation: Posterior  Staging: Stage 3 -  Full thickness tissue loss. Subcutaneous fat may be visible but bone, tendon or muscle are NOT exposed.  Wound Description (Comments):   Present on Admission: Yes     Pressure Injury 03/03/20 Buttocks Stage 3 -  Full thickness tissue loss. Subcutaneous fat may be visible but bone, tendon or muscle are NOT exposed. (Active)  03/03/20 2308  Location: Buttocks  Location Orientation:   Staging: Stage 3 -  Full thickness tissue loss. Subcutaneous fat may be visible but bone, tendon or  muscle are NOT exposed.  Wound Description (Comments):   Present on Admission: Yes   DVT prophylaxis: lovenox  Code Status: FULL Family Communication: plan of care discussed with patient at bedside.  Status is: Inpatient  Remains inpatient appropriate because: Unsafe disposition plan, awaiting for skilled nursing facility placement.  Dispo: The patient is from: Home              Anticipated d/c is to: SNF               Anticipated d/c date is: Once SNF approval is received              Patient currently is medically stable for discharge  Nutrition: Diet Order            DIET SOFT Room service appropriate? Yes; Fluid consistency: Thin  Diet effective now                 Body mass index is 60.46 kg/m. Consultants:see note  Procedures:see note Microbiology:see note  Medications: Scheduled Meds: . aspirin EC  81 mg Oral Daily  . carvedilol  12.5 mg Oral BID WC  . cephALEXin  500 mg Oral Q6H  . Chlorhexidine Gluconate Cloth  6 each Topical Daily  . colchicine  0.3 mg Oral QODAY  . enoxaparin (LOVENOX) injection  60 mg Subcutaneous Q24H  . folic acid  1 mg Oral Daily  . hydrALAZINE  25 mg Oral Q8H  . valACYclovir  1,000 mg Oral BID   Continuous Infusions: . sodium chloride 10 mL/hr at 03/09/20 1708    Antimicrobials: Anti-infectives (From admission, onward)   Start     Dose/Rate Route Frequency Ordered Stop   03/11/20 2000  cephALEXin (KEFLEX) capsule 500 mg     Discontinue     500 mg Oral Every 6 hours 03/11/20 1430     03/06/20 1500  fluconazole (DIFLUCAN) tablet 100 mg        100 mg Oral Daily 03/06/20 1406 03/10/20 1020   03/03/20 1400  ceFAZolin (ANCEF) IVPB 1 g/50 mL premix  Status:  Discontinued        1 g 100 mL/hr over 30 Minutes Intravenous Every 8 hours 03/03/20 1237 03/11/20 1430   03/03/20 1115  valACYclovir (VALTREX) tablet 1,000 mg     Discontinue     1,000 mg Oral 2 times daily 03/03/20 1106       Objective: Vitals: Today's Vitals   03/12/20  2131 03/12/20 2226 03/13/20 0519 03/13/20 0750  BP: (!) 131/56  (!) 116/52   Pulse: 100  96   Resp: 18  18   Temp: 99.1 F (37.3 C)  99.3 F (37.4 C)   TempSrc: Oral  Oral   SpO2: 97%  97%   Weight:      Height:      PainSc:  Asleep  0-No pain    Intake/Output Summary (Last 24 hours) at 03/13/2020 1326 Last data filed at 03/13/2020 0521 Gross per 24 hour  Intake --  Output 1100 ml  Net -1100 ml   Filed Weights   03/03/20 0942  Weight: (!) 145.2 kg   Weight change:    Intake/Output from previous day: 07/14 0701 - 07/15 0700 In: -  Out: 1100 [Urine:1100] Intake/Output this shift: No intake/output data recorded.  Examination:  General exam: AAOx3, obese, not in acute distress, pleasant, NAD, weak appearing. HEENT:Oral mucosa moist, Ear/Nose  WNL grossly, dentition normal. Respiratory system: bilaterally clear,no wheezing or crackles,no use of accessory muscle Cardiovascular system: S1 & S2 +, No JVD,. Gastrointestinal system: Abdomen soft, obese abdomen, abdominal wall skin pannus with healing areas of skin breakdown/wound no erythema, no tenderness or pus. Nervous System:Alert, awake, moving extremities and grossly nonfocal Extremities: No edema, distal peripheral pulses palpable.  Skin: No rashes,no icterus. MSK: Normal muscle bulk,tone, power Foley +  Data Reviewed: I have personally reviewed following labs and imaging studies CBC: Recent Labs  Lab 03/07/20 0500 03/08/20 0557 03/09/20 0506 03/10/20 0612 03/11/20 0620  WBC 21.9* 19.8* 21.9* 20.6* 18.9*  HGB 8.3* 7.7* 8.2* 8.0* 8.1*  HCT 25.7* 23.8* 25.2* 24.3* 25.0*  MCV 93.8 94.1 94.0 93.5 95.1  PLT 195 210 235 240 165   Basic Metabolic Panel: Recent Labs  Lab 03/07/20 0500 03/08/20 0557 03/09/20 0506 03/10/20 0612 03/11/20 0620  NA 138 133* 137 137 138  K 3.9 3.5 3.8 4.1 4.0  CL 104 103 104 103 102  CO2 24 23 26 24 27   GLUCOSE 107* 95 96 95 101*  BUN 21 17 14 13 12   CREATININE 1.27* 1.02* 0.97  0.90 0.83  CALCIUM 8.5* 7.9* 8.2* 8.1* 8.2*   GFR: Estimated Creatinine Clearance: 93.8 mL/min (by C-G formula based on SCr of 0.83 mg/dL). Liver Function Tests: Recent Labs  Lab 03/07/20 0500 03/08/20 0557  AST 13* 16  ALT 8 7  ALKPHOS 82 72  BILITOT 0.9 0.6  PROT 5.6* 5.1*  ALBUMIN 2.3* 2.1*   No results for input(s): LIPASE, AMYLASE in the last 168 hours. No results for input(s): AMMONIA in the last 168 hours. Coagulation Profile: No results for input(s): INR, PROTIME in the last 168 hours. Cardiac Enzymes: No results for input(s): CKTOTAL, CKMB, CKMBINDEX, TROPONINI in the last 168 hours. BNP (last 3 results) No results for input(s): PROBNP in the last 8760 hours. HbA1C: No results for input(s): HGBA1C in the last 72 hours. CBG: Recent Labs  Lab 03/10/20 0722  GLUCAP 108*   Lipid Profile: No results for input(s): CHOL, HDL, LDLCALC, TRIG, CHOLHDL, LDLDIRECT in the last 72 hours. Thyroid Function Tests: No results for input(s): TSH, T4TOTAL, FREET4, T3FREE, THYROIDAB in the last 72 hours. Anemia Panel: No results for input(s): VITAMINB12, FOLATE, FERRITIN, TIBC, IRON, RETICCTPCT in the last 72 hours. Sepsis Labs: No results for input(s): PROCALCITON, LATICACIDVEN in the last 168 hours.  No results found for this or any previous visit (from the past 240 hour(s)).    Radiology Studies: No results found.   LOS: 10 days   Antonieta Pert, MD [facility]  03/13/2020, 1:26 PM

## 2020-03-14 ENCOUNTER — Other Ambulatory Visit: Payer: Medicaid Other

## 2020-03-14 ENCOUNTER — Ambulatory Visit: Payer: Medicaid Other | Admitting: Oncology

## 2020-03-14 ENCOUNTER — Encounter: Payer: Self-pay | Admitting: *Deleted

## 2020-03-14 ENCOUNTER — Ambulatory Visit: Payer: Medicaid Other

## 2020-03-14 DIAGNOSIS — D6481 Anemia due to antineoplastic chemotherapy: Secondary | ICD-10-CM | POA: Diagnosis not present

## 2020-03-14 DIAGNOSIS — N179 Acute kidney failure, unspecified: Secondary | ICD-10-CM | POA: Diagnosis not present

## 2020-03-14 DIAGNOSIS — R238 Other skin changes: Secondary | ICD-10-CM | POA: Diagnosis not present

## 2020-03-14 DIAGNOSIS — D72829 Elevated white blood cell count, unspecified: Secondary | ICD-10-CM | POA: Diagnosis not present

## 2020-03-14 DIAGNOSIS — I10 Essential (primary) hypertension: Secondary | ICD-10-CM | POA: Diagnosis not present

## 2020-03-14 DIAGNOSIS — T451X5A Adverse effect of antineoplastic and immunosuppressive drugs, initial encounter: Secondary | ICD-10-CM | POA: Diagnosis not present

## 2020-03-14 LAB — CBC
HCT: 25.3 % — ABNORMAL LOW (ref 36.0–46.0)
Hemoglobin: 8 g/dL — ABNORMAL LOW (ref 12.0–15.0)
MCH: 30.7 pg (ref 26.0–34.0)
MCHC: 31.6 g/dL (ref 30.0–36.0)
MCV: 96.9 fL (ref 80.0–100.0)
Platelets: 331 10*3/uL (ref 150–400)
RBC: 2.61 MIL/uL — ABNORMAL LOW (ref 3.87–5.11)
RDW: 16.7 % — ABNORMAL HIGH (ref 11.5–15.5)
WBC: 15.4 10*3/uL — ABNORMAL HIGH (ref 4.0–10.5)
nRBC: 0.3 % — ABNORMAL HIGH (ref 0.0–0.2)

## 2020-03-14 LAB — BASIC METABOLIC PANEL
Anion gap: 7 (ref 5–15)
BUN: 16 mg/dL (ref 8–23)
CO2: 26 mmol/L (ref 22–32)
Calcium: 8.3 mg/dL — ABNORMAL LOW (ref 8.9–10.3)
Chloride: 102 mmol/L (ref 98–111)
Creatinine, Ser: 1.04 mg/dL — ABNORMAL HIGH (ref 0.44–1.00)
GFR calc Af Amer: >60 mL/min (ref >60)
GFR calc non Af Amer: 57 mL/min — ABNORMAL LOW (ref >60)
Glucose, Bld: 119 mg/dL — ABNORMAL HIGH (ref 70–99)
Potassium: 4.9 mmol/L (ref 3.5–5.1)
Sodium: 135 mmol/L (ref 135–145)

## 2020-03-14 LAB — GLUCOSE, CAPILLARY: Glucose-Capillary: 83 mg/dL (ref 70–99)

## 2020-03-14 NOTE — Progress Notes (Signed)
PROGRESS NOTE    [name]  JAS:[id number] DOB: 1955/09/11 DOA: 03/03/2020 PCP: [name], MD   Chef Complaints: Skin breakdown  Brief Narrative: 64 YO female with breast cancer status postmastectomy in April 2021 with negative margins undergoing adjuvant chemo Adriamycin plus cyclophosphamide last dose last week prior to admission, morbidly obese BMI 60, poorly mobile, lymphedema/venous insufficiency/CKD, hypertension recent admission for the inner thighs and perineum, who lives alone and a family member comes in to take care of her, has been incontinent with urine at all times noted to have a significant skin breakdown in the shower, sent to the ED for evaluation  In the ED found to have a lot of skin breakdown maceration several vesicles and also significant leukocytosis anemia, thrombocytopenia elevated BUN/creatinine of 1.6 up from 1.3 on June/30.  Foley catheter was placed, patient was admitted for IV antibiotics IV fluids hydration. Patient is followed by wound care to have Foley in place to allow for optimal wound healing.  Seen by PT OT skilled nursing facility has been recommended.  Initially reluctant and finally after talking with her oncologist agreed to go to SNF.  She is awaiting bed placement.  IV antibiotics has been changed to p.o.  Subjective:  Reports feeling not well, it hurts to pee.  Has Foley in place.  No fever otherwise. Had BM this morning no nausea vomiting.  No shortness of breath chest pain.  Assessment & Plan:  Multiple skin breakdown and cellulitis in the inner thighs/perneum/abdominal pannus area: Overall patient is a stable.Suspect etiology is multifactorial with poor mobility morbid obesity incontinent of urine in the setting of chemotherapy. On admission had lactic acidosis that has resolved. Also with leukocytosis but also contributed by her Neulasta.  Continue Keflex, continue dressing/wound care, continue Foley to keep the area dry  Leukocytosis:  Multifactorial due to Neulasta as well as patient's cellulitis from #1.  Repeat CBC today.   Recent Labs  Lab 03/08/20 0557 03/09/20 0506 03/10/20 0612 03/11/20 0620  WBC 19.8* 21.9* 20.6* 18.9*   Oral thrush s/p diflucan  Essential hypertension: BP stable on Coreg and hydralazine.   ALP:FXTKWI to 1.2-AKI is resolved.  Check BMP today.  Metabolic acidosis:Improved.  Folate deficiency/anemia of chronic disease: On admission 7.9 g and has decreased to 6.9, suspect multifactorial in the setting of cancer, folate deficiency.  Received 1 unit PRBC hemoglobin has stabilized.  Continue folate supplementation, monitor CBC today. Recent Labs  Lab 03/08/20 0557 03/09/20 0506 03/10/20 0612 03/11/20 0620  HGB 7.7* 8.2* 8.0* 8.1*  HCT 23.8* 25.2* 24.3* 25.0*    Deconditioning/poor mobility/malaise weakness: Cont PT/OT.  Pending SNF placement.    T2 N1 stage IIb grade 3 ER positive breast cancer followed by medical oncology chemotherapy session 7/6 has been posted for 7/13 but patient was admitted. Seen by oncology who will arrange for outpatient follow-up after discharge. Appreciate input.  Gout-stable on colchicine.  Thrombocytopenia platelet: cbc today  Hypomagnesemia: Repleted w/ Mag-Ox.   Super morbid obesity with BMI 60. Follow-up outpatient for healthy lifestyle/weight loss strategy  Pressure ulcer present on admission on buttock as below: We will continue wound care 03/03/20 2308  Location: Buttocks  Location Orientation: Posterior  Staging: Stage 3 -  Full thickness tissue loss. Subcutaneous fat may be visible but bone, tendon or muscle are NOT exposed.  Wound Description (Comments):   Present on Admission: Yes     Pressure Injury 03/03/20 Buttocks Stage 3 -  Full thickness tissue loss. Subcutaneous fat may be  visible but bone, tendon or muscle are NOT exposed. (Active)  03/03/20 2308  Location: Buttocks  Location Orientation:   Staging: Stage 3 -  Full thickness tissue  loss. Subcutaneous fat may be visible but bone, tendon or muscle are NOT exposed.  Wound Description (Comments):   Present on Admission: Yes   DVT prophylaxis: lovenox  Code Status: FULL Family Communication: plan of care discussed with patient at bedside.  Status is: Inpatient  Remains inpatient appropriate because: Unsafe disposition plan, awaiting for skilled nursing facility placement.  Dispo: The patient is from: Home              Anticipated d/c is to: SNF               Anticipated d/c date is: Once SNF approval is received              Patient currently is medically stable for discharge  Nutrition: Diet Order            DIET SOFT Room service appropriate? Yes; Fluid consistency: Thin  Diet effective now                 Body mass index is 60.46 kg/m. Consultants:see note  Procedures:see note Microbiology:see note  Medications: Scheduled Meds: . aspirin EC  81 mg Oral Daily  . carvedilol  12.5 mg Oral BID WC  . cephALEXin  500 mg Oral Q6H  . Chlorhexidine Gluconate Cloth  6 each Topical Daily  . colchicine  0.3 mg Oral QODAY  . enoxaparin (LOVENOX) injection  60 mg Subcutaneous Q24H  . folic acid  1 mg Oral Daily  . hydrALAZINE  25 mg Oral Q8H  . valACYclovir  1,000 mg Oral BID   Continuous Infusions: . sodium chloride 10 mL/hr at 03/09/20 1708    Antimicrobials: Anti-infectives (From admission, onward)   Start     Dose/Rate Route Frequency Ordered Stop   03/11/20 2000  cephALEXin (KEFLEX) capsule 500 mg     Discontinue     500 mg Oral Every 6 hours 03/11/20 1430     03/06/20 1500  fluconazole (DIFLUCAN) tablet 100 mg        100 mg Oral Daily 03/06/20 1406 03/10/20 1020   03/03/20 1400  ceFAZolin (ANCEF) IVPB 1 g/50 mL premix  Status:  Discontinued        1 g 100 mL/hr over 30 Minutes Intravenous Every 8 hours 03/03/20 1237 03/11/20 1430   03/03/20 1115  valACYclovir (VALTREX) tablet 1,000 mg     Discontinue     1,000 mg Oral 2 times daily 03/03/20 1106        Objective: Vitals: Today's Vitals   03/13/20 1349 03/13/20 2126 03/14/20 0022 03/14/20 0600  BP: 123/64 (!) 121/59  128/65  Pulse: 91 91  91  Resp: 20 18  18   Temp: 98.6 F (37 C) 98.6 F (37 C)  98.9 F (37.2 C)  TempSrc: Oral Oral  Oral  SpO2: 97% 97%  98%  Weight:      Height:      PainSc:   0-No pain     Intake/Output Summary (Last 24 hours) at 03/14/2020 1042 Last data filed at 03/14/2020 0600 Gross per 24 hour  Intake 480 ml  Output 2200 ml  Net -1720 ml   Filed Weights   03/03/20 0942  Weight: (!) 145.2 kg   Weight change:    Intake/Output from previous day: 07/15 0701 - 07/16 0700 In: 480 [P.O.:480]  Out: 2200 [Urine:2200] Intake/Output this shift: No intake/output data recorded.  Examination:  General exam: AAOx3, morbidly obese, NAD, weak appearing. HEENT:Oral mucosa moist, Ear/Nose WNL grossly, dentition normal. Respiratory system: bilaterally clear,no wheezing or crackles,no use of accessory muscle Cardiovascular system: S1 & S2 +, No JVD,. Gastrointestinal system: Abdomen soft, obese, abdominal pannus underneath with multiple areas of skin breakdown/wound and healing Nervous System:Alert, awake, moving extremities and grossly nonfocal Extremities: No edema, distal peripheral pulses palpable.  Skin: No rashes,no icterus.  Skin breakdown inner thigh perineal area healing. MSK: Normal muscle bulk,tone, power Foley catheter present cloudy-sediment+  Data Reviewed: I have personally reviewed following labs and imaging studies CBC: Recent Labs  Lab 03/08/20 0557 03/09/20 0506 03/10/20 0612 03/11/20 0620  WBC 19.8* 21.9* 20.6* 18.9*  HGB 7.7* 8.2* 8.0* 8.1*  HCT 23.8* 25.2* 24.3* 25.0*  MCV 94.1 94.0 93.5 95.1  PLT 210 235 240 176   Basic Metabolic Panel: Recent Labs  Lab 03/08/20 0557 03/09/20 0506 03/10/20 0612 03/11/20 0620  NA 133* 137 137 138  K 3.5 3.8 4.1 4.0  CL 103 104 103 102  CO2 23 26 24 27   GLUCOSE 95 96 95 101*    BUN 17 14 13 12   CREATININE 1.02* 0.97 0.90 0.83  CALCIUM 7.9* 8.2* 8.1* 8.2*   GFR: Estimated Creatinine Clearance: 93.8 mL/min (by C-G formula based on SCr of 0.83 mg/dL). Liver Function Tests: Recent Labs  Lab 03/08/20 0557  AST 16  ALT 7  ALKPHOS 72  BILITOT 0.6  PROT 5.1*  ALBUMIN 2.1*   No results for input(s): LIPASE, AMYLASE in the last 168 hours. No results for input(s): AMMONIA in the last 168 hours. Coagulation Profile: No results for input(s): INR, PROTIME in the last 168 hours. Cardiac Enzymes: No results for input(s): CKTOTAL, CKMB, CKMBINDEX, TROPONINI in the last 168 hours. BNP (last 3 results) No results for input(s): PROBNP in the last 8760 hours. HbA1C: No results for input(s): HGBA1C in the last 72 hours. CBG: Recent Labs  Lab 03/10/20 0722 03/14/20 0714  GLUCAP 108* 83   Lipid Profile: No results for input(s): CHOL, HDL, LDLCALC, TRIG, CHOLHDL, LDLDIRECT in the last 72 hours. Thyroid Function Tests: No results for input(s): TSH, T4TOTAL, FREET4, T3FREE, THYROIDAB in the last 72 hours. Anemia Panel: No results for input(s): VITAMINB12, FOLATE, FERRITIN, TIBC, IRON, RETICCTPCT in the last 72 hours. Sepsis Labs: No results for input(s): PROCALCITON, LATICACIDVEN in the last 168 hours.  No results found for this or any previous visit (from the past 240 hour(s)).    Radiology Studies: No results found.   LOS: 11 days   Antonieta Pert, MD [facility]  03/14/2020, 10:42 AM

## 2020-03-14 NOTE — Progress Notes (Signed)
Physical Therapy Treatment Patient Details Name: [name] MRN: [id number] DOB: 27-May-1956 Today's Date: 03/14/2020    History of Present Illness 64 yo female admitted with skin breakdown/cellulitis, weakness, difficulty functioning at home. Hx of breast ca s/p mastectomy, lymphedema, poor wound healing, venous insufficiency, CKD, morbid obesity, falls    PT Comments    Patient is progressing well with therapy in acute setting and remains highly motivated to participate. She progressed gait distance today and had improved use of RW and maintained safe distance. Patient continues to require extended rest breaks due to fatigue, HR stable ranging from 80's-100's. Pt educated on functional sit<>stands for exercise able to complete with single UE for power up today. She will continue to benefit from skilled PT interventions to address impairments and progress towards PLOF.   Follow Up Recommendations  SNF     Equipment Recommendations  None recommended by PT    Recommendations for Other Services       Precautions / Restrictions Precautions Precautions: Fall Precaution Comments: skin breakdown 2* body habitus Restrictions Weight Bearing Restrictions: No Other Position/Activity Restrictions: BMI 60 height 5'1"    Mobility  Bed Mobility               General bed mobility comments: pt OOB in recliner and ended in recliner.  Transfers Overall transfer level: Needs assistance Equipment used: Rolling walker (2 wheeled) Transfers: Sit to/from Stand Sit to Stand: +2 safety/equipment;Min assist            Ambulation/Gait Ambulation/Gait assistance: Min assist;+2 safety/equipment Gait Distance (Feet): 46 Feet (1x12, 1x14, 1x20) Assistive device: Rolling walker (2 wheeled) Gait Pattern/deviations: Step-through pattern;Decreased step length - right;Decreased step length - left;Decreased stride length;Wide base of support Gait velocity: decr   General Gait Details: VC's  for safe proximity to RW and close gaurd/min assist for safety. pt ambulated in hallway today for 3 bouts with extended seated rest breaks.    Stairs             Wheelchair Mobility    Modified Rankin (Stroke Patients Only)       Balance Overall balance assessment: Needs assistance Sitting-balance support: Feet supported Sitting balance-Leahy Scale: Good     Standing balance support: Bilateral upper extremity supported Standing balance-Leahy Scale: Fair                              Cognition Arousal/Alertness: Awake/alert Behavior During Therapy: WFL for tasks assessed/performed Overall Cognitive Status: Within Functional Limits for tasks assessed                                        Exercises Other Exercises Other Exercises: Repeated Sit<>Stand: 1x 5 reps from recliner, 1 UE for power up.     General Comments        Pertinent Vitals/Pain Pain Assessment: No/denies pain           PT Goals (current goals can now be found in the care plan section) Acute Rehab PT Goals Patient Stated Goal: to get strong enough to go to the beach with grandkids PT Goal Formulation: With patient Time For Goal Achievement: 03/18/20 Potential to Achieve Goals: Fair Progress towards PT goals: Progressing toward goals    Frequency    Min 3X/week      PT Plan Current plan remains appropriate  Co-evaluation              AM-PAC PT "6 Clicks" Mobility   Outcome Measure  Help needed turning from your back to your side while in a flat bed without using bedrails?: A Little Help needed moving from lying on your back to sitting on the side of a flat bed without using bedrails?: A Little Help needed moving to and from a bed to a chair (including a wheelchair)?: A Little Help needed standing up from a chair using your arms (e.g., wheelchair or bedside chair)?: A Little Help needed to walk in hospital room?: A Little Help needed climbing  3-5 steps with a railing? : Total 6 Click Score: 16    End of Session Equipment Utilized During Treatment: Gait belt Activity Tolerance: Patient tolerated treatment well Patient left: in chair;with call bell/phone within reach Nurse Communication: Mobility status PT Visit Diagnosis: Muscle weakness (generalized) (M62.81);Pain;Other abnormalities of gait and mobility (R26.89)     Time: 2585-2778 PT Time Calculation (min) (ACUTE ONLY): 24 min  Charges:  $Gait Training: 8-22 mins $Therapeutic Exercise: 8-22 mins                     Verner Mould, DPT Acute Rehabilitation Services  Office [phone] Pager [phone]  03/14/2020 1:30 PM

## 2020-03-15 DIAGNOSIS — R238 Other skin changes: Secondary | ICD-10-CM | POA: Diagnosis not present

## 2020-03-15 DIAGNOSIS — N179 Acute kidney failure, unspecified: Secondary | ICD-10-CM | POA: Diagnosis not present

## 2020-03-15 DIAGNOSIS — D6481 Anemia due to antineoplastic chemotherapy: Secondary | ICD-10-CM | POA: Diagnosis not present

## 2020-03-15 DIAGNOSIS — D72829 Elevated white blood cell count, unspecified: Secondary | ICD-10-CM | POA: Diagnosis not present

## 2020-03-15 DIAGNOSIS — I10 Essential (primary) hypertension: Secondary | ICD-10-CM | POA: Diagnosis not present

## 2020-03-15 DIAGNOSIS — T451X5A Adverse effect of antineoplastic and immunosuppressive drugs, initial encounter: Secondary | ICD-10-CM | POA: Diagnosis not present

## 2020-03-15 MED ORDER — ANASTROZOLE 1 MG PO TABS
1.0000 mg | ORAL_TABLET | Freq: Every day | ORAL | Status: DC
  Administered 2020-03-15 – 2020-03-21 (×7): 1 mg via ORAL
  Filled 2020-03-15 (×7): qty 1

## 2020-03-15 NOTE — Progress Notes (Signed)
In light of the difficulty we have had getting [name] through her chemo it seems prudent to start her on anti estrogens at this point and consider a second trial of chemotherapy once stable as outpatient   Accordingly added anastrozole to her meds. This unlike tamoxifen does not increase clotting risk as c/w placebo. Please make sure she continues on this medication at discharge   I will make sure she has follow up with [place] if discharged over the weekend   Thank you for your help to this patient!

## 2020-03-15 NOTE — Progress Notes (Signed)
Occupational Therapy Treatment Patient Details Name: [name] MRN: [id number] DOB: 01-24-1956 Today's Date: 03/15/2020    History of present illness 64 yo female admitted with skin breakdown/cellulitis, weakness, difficulty functioning at home. Hx of breast ca s/p mastectomy, lymphedema, poor wound healing, venous insufficiency, CKD, morbid obesity, falls   OT comments  Patient is pleasant and agreeable to therapy.Patient continues to make progress in regards to mobility and self care tasks. Today patient able to stand at sink x 2 to perform grooming task. Min guard for standing at sink. Patient reports using BSC today and being out of bed most of bed.  Continue to recommend SNF.   Follow Up Recommendations  SNF;Supervision/Assistance - 24 hour    Equipment Recommendations  Other (comment)    Recommendations for Other Services      Precautions / Restrictions Precautions Precautions: Fall Precaution Comments: skin breakdown 2* body habitus Restrictions Weight Bearing Restrictions: No Other Position/Activity Restrictions: BMI 60 height 5'1"       Mobility Bed Mobility                  Transfers       Sit to Stand: Min guard         General transfer comment: Patient perfomred two sit to stands at sink - using sink and pushing up from chair. Min guard for safety but no physical assistance    Balance           Standing balance support: Bilateral upper extremity supported Standing balance-Leahy Scale: Fair Standing balance comment: leaning on sink                           ADL either performed or assessed with clinical judgement   ADL       Grooming: Standing;Wash/dry hands;Wash/dry face Grooming Details (indicate cue type and reason): Patient performed standing grooming task at sink to work on standing tolerance and improving independence with ADls. Patient stood 1 min x 2 to wash hands and clean nails. After grooming task patient  applied lotion to arms in seated position.                                     Vision       Perception     Praxis      Cognition Arousal/Alertness: Awake/alert Behavior During Therapy: WFL for tasks assessed/performed Overall Cognitive Status: Within Functional Limits for tasks assessed                                          Exercises     Shoulder Instructions       General Comments      Pertinent Vitals/ Pain       Pain Assessment: No/denies pain  Home Living                                          Prior Functioning/Environment              Frequency  Min 2X/week        Progress Toward Goals  OT Goals(current goals can now be found in the care plan section)  Acute Rehab OT Goals Patient Stated Goal: to get strong enough to go to the beach with grandkids OT Goal Formulation: With patient Time For Goal Achievement: 03/19/20 Potential to Achieve Goals: Good  Plan      Co-evaluation          OT goals addressed during session: ADL's and self-care      AM-PAC OT "6 Clicks" Daily Activity     Outcome Measure   Help from another person eating meals?: None Help from another person taking care of personal grooming?: None Help from another person toileting, which includes using toliet, bedpan, or urinal?: A Lot Help from another person bathing (including washing, rinsing, drying)?: A Lot Help from another person to put on and taking off regular upper body clothing?: A Little Help from another person to put on and taking off regular lower body clothing?: A Lot 6 Click Score: 17    End of Session    OT Visit Diagnosis: Unsteadiness on feet (R26.81);Other abnormalities of gait and mobility (R26.89);Pain   Activity Tolerance Patient tolerated treatment well   Patient Left in chair;with call bell/phone within reach   Nurse Communication Mobility status        Time: 7011-0034 OT Time  Calculation (min): 20 min  Charges: OT General Charges $OT Visit: 1 Visit OT Treatments $Self Care/Home Management : 8-22 mins  Derl Barrow, OTR/L [place]  Office [phone] Pager: [place] 03/15/2020, 5:08 PM

## 2020-03-15 NOTE — Progress Notes (Signed)
Patient asked that her code status be changed to Full Code.

## 2020-03-15 NOTE — Progress Notes (Signed)
PROGRESS NOTE    [name]  UDJ:[id number] DOB: 1955/11/07 DOA: 03/03/2020 PCP: [name], MD   Chef Complaints: Skin breakdown  Brief Narrative: 64 YO female with breast cancer status postmastectomy in April 2021 with negative margins undergoing adjuvant chemo Adriamycin plus cyclophosphamide last dose last week prior to admission, morbidly obese BMI 60, poorly mobile, lymphedema/venous insufficiency/CKD, hypertension recent admission for the inner thighs and perineum, who lives alone and a family member comes in to take care of her, has been incontinent with urine at all times noted to have a significant skin breakdown in the shower, sent to the ED for evaluation  In the ED found to have a lot of skin breakdown maceration several vesicles and also significant leukocytosis anemia, thrombocytopenia elevated BUN/creatinine of 1.6 up from 1.3 on June/30.  Foley catheter was placed, patient was admitted for IV antibiotics IV fluids hydration. Patient is followed by wound care to have Foley in place to allow for optimal wound healing.  Seen by PT OT skilled nursing facility has been recommended.  Initially reluctant and finally after talking with her oncologist agreed to go to SNF.  She is awaiting bed placement.  IV antibiotics has been changed to p.o.  Subjective:  Seen this morning.  Resting comfortably on the bedside chair.  Denies any urinary symptoms fever chills nausea vomiting.  Assessment & Plan:  Multiple skin breakdown and cellulitis in the inner thighs/perneum/abdominal pannus area: Overall patient is a stable.Suspect etiology is multifactorial with poor mobility morbid obesity incontinent of urine in the setting of chemotherapy. On admission had lactic acidosis that has resolved.  Patient has leukocytosis suspecting partly from Neulasta and partly from cellulitis.  WBC is downtrending.  She is afebrile.  This time plan is to continue dressing, complete oral Keflex due to being on  chemo.  Keep the Foley to keep the area dry  Leukocytosis: Suspecting due to Neulasta and cellulitis.  Improving.   Recent Labs  Lab 03/09/20 0506 03/10/20 0612 03/11/20 0620 03/14/20 1043  WBC 21.9* 20.6* 18.9* 15.4*   Oral thrush s/p diflucan  Essential hypertension: BP controlled on Coreg and hydralazine.    HYI:FOYDXA to 1.2-AKI is resolved.  Check BMP today.  Metabolic acidosis:Improved.  Folate deficiency/anemia of chronic disease: On admission 7.9 g and has decreased to 6.9, suspect multifactorial in the setting of cancer, folate deficiency.  Received 1 unit PRBC hemoglobin has stabilized.  Continue folate supplementation, monitor CBC today. Recent Labs  Lab 03/09/20 0506 03/10/20 0612 03/11/20 0620 03/14/20 1043  HGB 8.2* 8.0* 8.1* 8.0*  HCT 25.2* 24.3* 25.0* 25.3*    Deconditioning/poor mobility/malaise weakness: Cont PT/OT.  Pending SNF placement.    T2 N1 stage IIb grade 3 ER positive breast cancer followed by medical oncology chemotherapy session 7/6 has been posted for 7/13 but patient was admitted. Seen by oncology who will arrange for outpatient follow-up after discharge. Appreciate input.  Gout-stable on colchicine.  Thrombocytopenia platelet: stable. Recent Labs  Lab 03/09/20 0506 03/10/20 0612 03/11/20 0620 03/14/20 1043  PLT 235 240 256 331    Hypomagnesemia: Repleted w/ Mag-Ox.   Super morbid obesity with BMI 60. Follow-up outpatient for healthy lifestyle/weight loss strategy  Pressure ulcer present on admission on buttock as below: We will continue wound care 03/03/20 2308  Location: Buttocks  Location Orientation: Posterior  Staging: Stage 3 -  Full thickness tissue loss. Subcutaneous fat may be visible but bone, tendon or muscle are NOT exposed.  Wound Description (Comments):  Present on Admission: Yes     Pressure Injury 03/03/20 Buttocks Stage 3 -  Full thickness tissue loss. Subcutaneous fat may be visible but bone, tendon or muscle  are NOT exposed. (Active)  03/03/20 2308  Location: Buttocks  Location Orientation:   Staging: Stage 3 -  Full thickness tissue loss. Subcutaneous fat may be visible but bone, tendon or muscle are NOT exposed.  Wound Description (Comments):   Present on Admission: Yes   DVT prophylaxis: lovenox  Code Status: FULL Family Communication: plan of care discussed with patient at bedside.  Status is: Inpatient  Remains inpatient appropriate because: Unsafe disposition plan, awaiting for skilled nursing facility placement.  Dispo: The patient is from: Home              Anticipated d/c is to: SNF               Anticipated d/c date is: Once SNF approval is received              Patient currently is medically stable for discharge  Nutrition: Diet Order            DIET SOFT Room service appropriate? Yes; Fluid consistency: Thin  Diet effective now                 Body mass index is 60.46 kg/m. Consultants:see note  Procedures:see note Microbiology:see note  Medications: Scheduled Meds:  anastrozole  1 mg Oral Daily   aspirin EC  81 mg Oral Daily   carvedilol  12.5 mg Oral BID WC   cephALEXin  500 mg Oral Q6H   Chlorhexidine Gluconate Cloth  6 each Topical Daily   colchicine  0.3 mg Oral QODAY   enoxaparin (LOVENOX) injection  60 mg Subcutaneous K99I   folic acid  1 mg Oral Daily   hydrALAZINE  25 mg Oral Q8H   valACYclovir  1,000 mg Oral BID   Continuous Infusions:  sodium chloride 10 mL/hr at 03/09/20 1708    Antimicrobials: Anti-infectives (From admission, onward)   Start     Dose/Rate Route Frequency Ordered Stop   03/11/20 2000  cephALEXin (KEFLEX) capsule 500 mg     Discontinue     500 mg Oral Every 6 hours 03/11/20 1430     03/06/20 1500  fluconazole (DIFLUCAN) tablet 100 mg        100 mg Oral Daily 03/06/20 1406 03/10/20 1020   03/03/20 1400  ceFAZolin (ANCEF) IVPB 1 g/50 mL premix  Status:  Discontinued        1 g 100 mL/hr over 30 Minutes  Intravenous Every 8 hours 03/03/20 1237 03/11/20 1430   03/03/20 1115  valACYclovir (VALTREX) tablet 1,000 mg     Discontinue     1,000 mg Oral 2 times daily 03/03/20 1106       Objective: Vitals: Today's Vitals   03/14/20 2216 03/15/20 0611 03/15/20 0800 03/15/20 1358  BP:  116/60  121/74  Pulse:  86  86  Resp:  16  15  Temp:  98.5 F (36.9 C)  98.6 F (37 C)  TempSrc:  Oral  Oral  SpO2:  99%  96%  Weight:      Height:      PainSc: 0-No pain  0-No pain     Intake/Output Summary (Last 24 hours) at 03/15/2020 1453 Last data filed at 03/15/2020 1422 Gross per 24 hour  Intake 1080 ml  Output 1550 ml  Net -470 ml   Filed  Weights   03/03/20 0942  Weight: (!) 145.2 kg   Weight change:    Intake/Output from previous day: 07/16 0701 - 07/17 0700 In: 1320 [P.O.:1320] Out: 1450 [Urine:1450] Intake/Output this shift: Total I/O In: 600 [P.O.:600] Out: 550 [Urine:550]  Examination:  General exam: AAO x3, mildly obese, on bedside chair. HEENT:Oral mucosa moist, Ear/Nose WNL grossly, dentition normal. Respiratory system: bilaterally diminished,no wheezing or crackles,no use of accessory muscle. Cardiovascular system: S1 & S2 +, No JVD. Gastrointestinal system: Abdomen soft, NT abdominal wall/pannus with extensive skin fold and multiple areas of wound that are healing. Nervous System:Alert, awake, moving extremities and grossly non-focal. Extremities: No edema, distal peripheral pulses palpable.  Skin: No rashes,no icterus. MSK: Normal muscle bulk,tone, power. Foley+  Data Reviewed: I have personally reviewed following labs and imaging studies CBC: Recent Labs  Lab 03/09/20 0506 03/10/20 0612 03/11/20 0620 03/14/20 1043  WBC 21.9* 20.6* 18.9* 15.4*  HGB 8.2* 8.0* 8.1* 8.0*  HCT 25.2* 24.3* 25.0* 25.3*  MCV 94.0 93.5 95.1 96.9  PLT 235 240 256 867   Basic Metabolic Panel: Recent Labs  Lab 03/09/20 0506 03/10/20 0612 03/11/20 0620 03/14/20 1043  NA 137 137  138 135  K 3.8 4.1 4.0 4.9  CL 104 103 102 102  CO2 26 24 27 26   GLUCOSE 96 95 101* 119*  BUN 14 13 12 16   CREATININE 0.97 0.90 0.83 1.04*  CALCIUM 8.2* 8.1* 8.2* 8.3*   GFR: Estimated Creatinine Clearance: 74.9 mL/min (A) (by C-G formula based on SCr of 1.04 mg/dL (H)). Liver Function Tests: No results for input(s): AST, ALT, ALKPHOS, BILITOT, PROT, ALBUMIN in the last 168 hours. No results for input(s): LIPASE, AMYLASE in the last 168 hours. No results for input(s): AMMONIA in the last 168 hours. Coagulation Profile: No results for input(s): INR, PROTIME in the last 168 hours. Cardiac Enzymes: No results for input(s): CKTOTAL, CKMB, CKMBINDEX, TROPONINI in the last 168 hours. BNP (last 3 results) No results for input(s): PROBNP in the last 8760 hours. HbA1C: No results for input(s): HGBA1C in the last 72 hours. CBG: Recent Labs  Lab 03/10/20 0722 03/14/20 0714  GLUCAP 108* 83   Lipid Profile: No results for input(s): CHOL, HDL, LDLCALC, TRIG, CHOLHDL, LDLDIRECT in the last 72 hours. Thyroid Function Tests: No results for input(s): TSH, T4TOTAL, FREET4, T3FREE, THYROIDAB in the last 72 hours. Anemia Panel: No results for input(s): VITAMINB12, FOLATE, FERRITIN, TIBC, IRON, RETICCTPCT in the last 72 hours. Sepsis Labs: No results for input(s): PROCALCITON, LATICACIDVEN in the last 168 hours.  No results found for this or any previous visit (from the past 240 hour(s)).    Radiology Studies: No results found.   LOS: 12 days   Antonieta Pert, MD [facility]  03/15/2020, 2:53 PM

## 2020-03-16 DIAGNOSIS — D72829 Elevated white blood cell count, unspecified: Secondary | ICD-10-CM | POA: Diagnosis not present

## 2020-03-16 DIAGNOSIS — I10 Essential (primary) hypertension: Secondary | ICD-10-CM | POA: Diagnosis not present

## 2020-03-16 DIAGNOSIS — N179 Acute kidney failure, unspecified: Secondary | ICD-10-CM | POA: Diagnosis not present

## 2020-03-16 DIAGNOSIS — D6481 Anemia due to antineoplastic chemotherapy: Secondary | ICD-10-CM | POA: Diagnosis not present

## 2020-03-16 DIAGNOSIS — T451X5A Adverse effect of antineoplastic and immunosuppressive drugs, initial encounter: Secondary | ICD-10-CM | POA: Diagnosis not present

## 2020-03-16 DIAGNOSIS — R238 Other skin changes: Secondary | ICD-10-CM | POA: Diagnosis not present

## 2020-03-16 MED ORDER — BISACODYL 10 MG RE SUPP: 10.0000 mg | Freq: Every day | RECTAL | Status: DC | PRN

## 2020-03-16 MED ORDER — POLYETHYLENE GLYCOL 3350 17 G PO PACK
17.0000 g | PACK | Freq: Two times a day (BID) | ORAL | Status: DC
  Administered 2020-03-16 – 2020-03-21 (×6): 17 g via ORAL
  Filled 2020-03-16 (×6): qty 1

## 2020-03-16 NOTE — Progress Notes (Signed)
PROGRESS NOTE    [name]  QMV:[id number] DOB: 10/13/1955 DOA: 03/03/2020 PCP: [name], MD   Chef Complaints: Skin breakdown  Brief Narrative: 64 YO female with breast cancer status postmastectomy in April 2021 with negative margins undergoing adjuvant chemo Adriamycin plus cyclophosphamide last dose last week prior to admission, morbidly obese BMI 60, poorly mobile, lymphedema/venous insufficiency/CKD, hypertension recent admission for the inner thighs and perineum, who lives alone and a family member comes in to take care of her, has been incontinent with urine at all times noted to have a significant skin breakdown in the shower, sent to the ED for evaluation  In the ED found to have a lot of skin breakdown maceration several vesicles and also significant leukocytosis anemia, thrombocytopenia elevated BUN/creatinine of 1.6 up from 1.3 on June/30.  Foley catheter was placed, patient was admitted for IV antibiotics IV fluids hydration. Patient is followed by wound care to have Foley in place to allow for optimal wound healing.  Seen by PT OT skilled nursing facility has been recommended.  Initially reluctant and finally after talking with her oncologist agreed to go to SNF.  She is awaiting bed placement.  IV antibiotics has been changed to p.o.  Subjective:  No acute events overnight.  Patient remains afebrile. Complains of constipation.  Assessment & Plan:  Multiple skin breakdown and cellulitis in the inner thighs/perneum/abdominal pannus area: Overall patient is a stable.Suspect etiology is multifactorial with poor mobility morbid obesity incontinent of urine in the setting of chemotherapy.  Initial lactic acidosis resolved, had leukocytosis and has nicely down trended.  She remains afebrile.  Wound is healing nicely antibiotics changed to Keflex p.o., immunosuppressed in the setting of chemo, Foley is continued to keep the area dry to promote healing hopefully can DC in a  week  Leukocytosis: Suspecting due to Neulasta and cellulitis.  Check CBC in 1 week.  Improving. Recent Labs  Lab 03/10/20 0612 03/11/20 0620 03/14/20 1043  WBC 20.6* 18.9* 15.4*   Oral thrush received diflucan  Essential hypertension: BP controlled on Coreg and hydralazine.    MWU:XLKGMW to 1.2-AKI is resolved.  Check BMP today.  Metabolic acidosis:Improved.  Constipation will change MiraLAX to daily, add Dulcolax suppository as needed.  Folate deficiency anemia in the setting of anemia of chronic disease: On admission 7.9 g and was as low as 6.9, received 1 unit PRBC hemoglobin is stable continue folate supplementation.   Recent Labs  Lab 03/10/20 0612 03/11/20 0620 03/14/20 1043  HGB 8.0* 8.1* 8.0*  HCT 24.3* 25.0* 25.3*   Deconditioning/poor mobility/malaise weakness: Cont PT/OT.  Pending SNF placement.    T2 N1 stage IIb grade 3 ER positive breast cancer followed by medical oncology chemotherapy session 7/6 has been posted for 7/13 but patient was admitted. Seen by oncology who will arrange for outpatient follow-up after discharge.  Oncology has started her on anastrozole 7/17 advised to continue outpatient.  Gout-stable on colchicine.  Thrombocytopenia platelet: Platelet count has been normal. Recent Labs  Lab 03/10/20 0612 03/11/20 0620 03/14/20 1043  PLT 240 256 331   Hypomagnesemia: Repleted w/ Mag-Ox.   Super morbid obesity with BMI 60. Follow-up outpatient for healthy lifestyle/weight loss strategy  Pressure ulcer present on admission on buttock as below: We will continue wound care 03/03/20 2308  Location: Buttocks  Location Orientation: Posterior  Staging: Stage 3 -  Full thickness tissue loss. Subcutaneous fat may be visible but bone, tendon or muscle are NOT exposed.  Wound Description (Comments):  Present on Admission: Yes     Pressure Injury 03/03/20 Buttocks Stage 3 -  Full thickness tissue loss. Subcutaneous fat may be visible but bone,  tendon or muscle are NOT exposed. (Active)  03/03/20 2308  Location: Buttocks  Location Orientation:   Staging: Stage 3 -  Full thickness tissue loss. Subcutaneous fat may be visible but bone, tendon or muscle are NOT exposed.  Wound Description (Comments):   Present on Admission: Yes   DVT prophylaxis: lovenox  Code Status: FULL Family Communication: plan of care discussed with patient at bedside.  Status is: Inpatient  Remains inpatient appropriate because: Unsafe disposition plan, awaiting for skilled nursing facility placement.  Dispo: The patient is from: Home              Anticipated d/c is to: SNF               Anticipated d/c date is: Once SNF approval is approved, Education officer, museum working with Medicaid.              Patient currently is medically stable for discharge  Nutrition: Diet Order            DIET SOFT Room service appropriate? Yes; Fluid consistency: Thin  Diet effective now                 Body mass index is 60.46 kg/m. Consultants:see note  Procedures:see note Microbiology:see note  Medications: Scheduled Meds: . anastrozole  1 mg Oral Daily  . aspirin EC  81 mg Oral Daily  . carvedilol  12.5 mg Oral BID WC  . cephALEXin  500 mg Oral Q6H  . Chlorhexidine Gluconate Cloth  6 each Topical Daily  . colchicine  0.3 mg Oral QODAY  . enoxaparin (LOVENOX) injection  60 mg Subcutaneous Q24H  . folic acid  1 mg Oral Daily  . hydrALAZINE  25 mg Oral Q8H  . valACYclovir  1,000 mg Oral BID   Continuous Infusions: . sodium chloride 10 mL/hr at 03/09/20 1708    Antimicrobials: Anti-infectives (From admission, onward)   Start     Dose/Rate Route Frequency Ordered Stop   03/11/20 2000  cephALEXin (KEFLEX) capsule 500 mg     Discontinue     500 mg Oral Every 6 hours 03/11/20 1430     03/06/20 1500  fluconazole (DIFLUCAN) tablet 100 mg        100 mg Oral Daily 03/06/20 1406 03/10/20 1020   03/03/20 1400  ceFAZolin (ANCEF) IVPB 1 g/50 mL premix  Status:   Discontinued        1 g 100 mL/hr over 30 Minutes Intravenous Every 8 hours 03/03/20 1237 03/11/20 1430   03/03/20 1115  valACYclovir (VALTREX) tablet 1,000 mg     Discontinue     1,000 mg Oral 2 times daily 03/03/20 1106       Objective: Vitals: Today's Vitals   03/15/20 1358 03/15/20 2235 03/16/20 0538 03/16/20 0645  BP: 121/74 118/65 (!) 109/55   Pulse: 86 86 93   Resp: 15 16 20    Temp: 98.6 F (37 C) 98.8 F (37.1 C) 99 F (37.2 C)   TempSrc: Oral Oral Oral   SpO2: 96% 98% 98%   Weight:      Height:      PainSc:    9     Intake/Output Summary (Last 24 hours) at 03/16/2020 0750 Last data filed at 03/16/2020 0539 Gross per 24 hour  Intake 840 ml  Output 1700  ml  Net -860 ml   Filed Weights   03/03/20 0942  Weight: (!) 145.2 kg   Weight change:    Intake/Output from previous day: 07/17 0701 - 07/18 0700 In: 840 [P.O.:840] Out: 1700 [Urine:1700] Intake/Output this shift: No intake/output data recorded.  Examination:  General exam:AAOx3,NAD,weak appearing. HEENT:Oral mucosa moist, Ear/Nose WNL grossly, dentition normal. Respiratory system:Bilaterally clear,no wheezing or crackles,no use of accessory muscle. Cardiovascular system:S1 & S2 +, No JVD. Gastrointestinal system:Abdomen soft, morbidly obese, abdominal wall skin fold status multiple areas of wounds which are healing. Nervous System:Alert, awake, moving extremities and grossly nonfocal. Extremities:Bilateral chronic edema, distal peripheral pulses palpable.  Skin:No rashes,no icterus.  Perineal and inner thigh area with skin breakdown with dressing intact. NID:POEUMP muscle bulk,tone, power. Foley+  Data Reviewed: I have personally reviewed following labs and imaging studies CBC: Recent Labs  Lab 03/10/20 0612 03/11/20 0620 03/14/20 1043  WBC 20.6* 18.9* 15.4*  HGB 8.0* 8.1* 8.0*  HCT 24.3* 25.0* 25.3*  MCV 93.5 95.1 96.9  PLT 240 256 536   Basic Metabolic Panel: Recent Labs  Lab  03/10/20 0612 03/11/20 0620 03/14/20 1043  NA 137 138 135  K 4.1 4.0 4.9  CL 103 102 102  CO2 24 27 26   GLUCOSE 95 101* 119*  BUN 13 12 16   CREATININE 0.90 0.83 1.04*  CALCIUM 8.1* 8.2* 8.3*   GFR: Estimated Creatinine Clearance: 74.9 mL/min (A) (by C-G formula based on SCr of 1.04 mg/dL (H)). Liver Function Tests: No results for input(s): AST, ALT, ALKPHOS, BILITOT, PROT, ALBUMIN in the last 168 hours. No results for input(s): LIPASE, AMYLASE in the last 168 hours. No results for input(s): AMMONIA in the last 168 hours. Coagulation Profile: No results for input(s): INR, PROTIME in the last 168 hours. Cardiac Enzymes: No results for input(s): CKTOTAL, CKMB, CKMBINDEX, TROPONINI in the last 168 hours. BNP (last 3 results) No results for input(s): PROBNP in the last 8760 hours. HbA1C: No results for input(s): HGBA1C in the last 72 hours. CBG: Recent Labs  Lab 03/10/20 0722 03/14/20 0714  GLUCAP 108* 83   Lipid Profile: No results for input(s): CHOL, HDL, LDLCALC, TRIG, CHOLHDL, LDLDIRECT in the last 72 hours. Thyroid Function Tests: No results for input(s): TSH, T4TOTAL, FREET4, T3FREE, THYROIDAB in the last 72 hours. Anemia Panel: No results for input(s): VITAMINB12, FOLATE, FERRITIN, TIBC, IRON, RETICCTPCT in the last 72 hours. Sepsis Labs: No results for input(s): PROCALCITON, LATICACIDVEN in the last 168 hours.  No results found for this or any previous visit (from the past 240 hour(s)).   Radiology Studies: No results found.   LOS: 13 days   Antonieta Pert, MD [facility]  03/16/2020, 7:50 AM

## 2020-03-17 ENCOUNTER — Encounter: Payer: Self-pay | Admitting: *Deleted

## 2020-03-17 DIAGNOSIS — T451X5A Adverse effect of antineoplastic and immunosuppressive drugs, initial encounter: Secondary | ICD-10-CM | POA: Diagnosis not present

## 2020-03-17 DIAGNOSIS — R238 Other skin changes: Secondary | ICD-10-CM | POA: Diagnosis not present

## 2020-03-17 DIAGNOSIS — N179 Acute kidney failure, unspecified: Secondary | ICD-10-CM | POA: Diagnosis not present

## 2020-03-17 DIAGNOSIS — D72829 Elevated white blood cell count, unspecified: Secondary | ICD-10-CM | POA: Diagnosis not present

## 2020-03-17 DIAGNOSIS — I10 Essential (primary) hypertension: Secondary | ICD-10-CM | POA: Diagnosis not present

## 2020-03-17 DIAGNOSIS — D6481 Anemia due to antineoplastic chemotherapy: Secondary | ICD-10-CM | POA: Diagnosis not present

## 2020-03-17 LAB — CREATININE, SERUM
Creatinine, Ser: 0.96 mg/dL (ref 0.44–1.00)
GFR calc Af Amer: >60 mL/min (ref >60)
GFR calc non Af Amer: >60 mL/min (ref >60)

## 2020-03-17 MED ORDER — CEPHALEXIN 500 MG PO CAPS
500.0000 mg | ORAL_CAPSULE | Freq: Four times a day (QID) | ORAL | Status: AC
  Administered 2020-03-17 – 2020-03-18 (×4): 500 mg via ORAL
  Filled 2020-03-17 (×3): qty 1

## 2020-03-17 NOTE — Consult Note (Signed)
WOC consulted related to need for FC. Discussed with bedside nurse. Message sent to the MD.  Ok to DC foley. This patient is not incontinent. She can stand a pivot to the [facility]. She is aware when she needs to have BM or urinate. If safety an issue with transfers a female external urine collection device can be applied (Pure[employer]ick).  Discussed POC with patient and bedside nurse.  Re consult if needed, will not follow at this time. Thanks  Bransen Fassnacht [employer], RN,CWOCN, CNS, [place] [phone])

## 2020-03-17 NOTE — Progress Notes (Signed)
PROGRESS NOTE    [name]  KYH:[id number] DOB: Apr 18, 1956 DOA: 03/03/2020 PCP: [name], MD   Chef Complaints: Skin breakdown  Brief Narrative: 64 YO female with breast cancer status postmastectomy in April 2021 with negative margins undergoing adjuvant chemo Adriamycin plus cyclophosphamide last dose last week prior to admission, morbidly obese BMI 60, poorly mobile, lymphedema/venous insufficiency/CKD, hypertension recent admission for the inner thighs and perineum, who lives alone and a family member comes in to take care of her, has been incontinent with urine at all times noted to have a significant skin breakdown in the shower, sent to the ED for evaluation  In the ED found to have a lot of skin breakdown maceration several vesicles and also significant leukocytosis anemia, thrombocytopenia elevated BUN/creatinine of 1.6 up from 1.3 on June/30.  Foley catheter was placed, patient was admitted for IV antibiotics IV fluids hydration. Patient is followed by wound care to have Foley in place to allow for optimal wound healing.  Seen by PT OT skilled nursing facility has been recommended.  Initially reluctant and finally after talking with her oncologist agreed to go to SNF.  She is awaiting bed placement.  IV antibiotics has been changed to p.o. Wound care has reevaluated patient is more ambulatory at this time no need to continue Foley catheter and is being discontinued 7/19  Subjective:  Resting comfortably in bedside chair this morning. Denies abdominal pain nausea vomiting fever chills. Wound care reeval ordered today Had a bowel movement and constipation improved  Assessment & Plan:  Multiple skin breakdown and cellulitis in the inner thighs/perneum/abdominal pannus area:Suspect etiology is multifactorial with poor mobility morbid obesity incontinent of urine, in the setting of chemotherapy.  Initial lactic acidosis resolved, had leukocytosis and has nicely down trended.   Afebrile, ill with wound care, only 3 eval ordered today since patient is much more ambulatory able to get up to void, Foley is felt to be unnecessary and is being discontinued today.  Can use purewick moving forward if need to keep the area dry. Cont Keflex p.o and stop after 2 wk course. Cont local wound care per wound consult.   Leukocytosis: Suspecting due to Neulasta and cellulitis.  Check CBC in 1 week.  Improving. Recent Labs  Lab 03/11/20 0620 03/14/20 1043  WBC 18.9* 15.4*   Oral thrush received diflucan  Essential hypertension: BP controlled on Coreg and hydralazine.    TDV:VOHYWV to 1.2. AKI has resolved.   Metabolic acidosis:Improved.  Constipation: Continue MiraLAX, reports having bowel movement with the MiraLAX.   Folate deficiency anemia in the setting of anemia of chronic disease: On admission 7.9 g and was as low as 6.9, received 1 unit PRBC.  Hemoglobin is overall stable.  Continue folate supplementation.   Recent Labs  Lab 03/11/20 0620 03/14/20 1043  HGB 8.1* 8.0*  HCT 25.0* 25.3*   Deconditioning/poor mobility/malaise weakness: Cont PT/OT.  Pending SNF placement.   Lymphedema/chronic lower leg edema-stable.  T2 N1 stage IIb grade 3 ER positive breast cancer followed by medical oncology chemotherapy session 7/6 has been posted for 7/13 but patient was admitted. Seen by oncology who will arrange for outpatient follow-up after discharge.  Oncology has started her on anastrozole 7/17 advised to continue outpatient.  Gout-stable on colchicine.  Thrombocytopenia platelet: plt counts are stable. Hypomagnesemia: Repleted w/ Mag-Ox.   Super morbid obesity with BMI 60. Follow-up outpatient for healthy lifestyle/weight loss strategy  Pressure ulcer present on admission on buttock as below: We will continue  wound care 03/03/20 2308  Location: Buttocks  Location Orientation: Posterior  Staging: Stage 3 -  Full thickness tissue loss. Subcutaneous fat may be visible  but bone, tendon or muscle are NOT exposed.  Wound Description (Comments):   Present on Admission: Yes     Pressure Injury 03/03/20 Buttocks Stage 3 -  Full thickness tissue loss. Subcutaneous fat may be visible but bone, tendon or muscle are NOT exposed. (Active)  03/03/20 2308  Location: Buttocks  Location Orientation:   Staging: Stage 3 -  Full thickness tissue loss. Subcutaneous fat may be visible but bone, tendon or muscle are NOT exposed.  Wound Description (Comments):   Present on Admission: Yes   DVT prophylaxis: lovenox  Code Status: FULL Family Communication: plan of care discussed with patient at bedside.  Status is: Inpatient  Remains inpatient appropriate because: Unsafe disposition plan, awaiting for skilled nursing facility placement.  Dispo: The patient is from: Home              Anticipated d/c is to: SNF               Anticipated d/c date is: Once SNF is available.  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              Patient currently is medically stable for discharge  Nutrition: Diet Order            DIET SOFT Room service appropriate? Yes; Fluid consistency: Thin  Diet effective now                 Body mass index is 60.46 kg/m. Consultants:see note  Procedures:see note Microbiology:see note  Medications: Scheduled Meds: . anastrozole  1 mg Oral Daily  . aspirin EC  81 mg Oral Daily  . carvedilol  12.5 mg Oral BID WC  . cephALEXin  500 mg Oral Q6H  . Chlorhexidine Gluconate Cloth  6 each Topical Daily  . colchicine  0.3 mg Oral QODAY  . enoxaparin (LOVENOX) injection  60 mg Subcutaneous Q24H  . folic acid  1 mg Oral Daily  . hydrALAZINE  25 mg Oral Q8H  . polyethylene glycol  17 g Oral BID   Continuous Infusions: . sodium chloride 10 mL/hr at 03/09/20 1708    Antimicrobials: Anti-infectives (From admission, onward)   Start     Dose/Rate Route Frequency Ordered Stop   03/17/20 1800  cephALEXin (KEFLEX) capsule 500 mg     Discontinue     500  mg Oral Every 6 hours 03/17/20 1335 03/18/20 1759   03/11/20 2000  cephALEXin (KEFLEX) capsule 500 mg  Status:  Discontinued        500 mg Oral Every 6 hours 03/11/20 1430 03/17/20 1335   03/06/20 1500  fluconazole (DIFLUCAN) tablet 100 mg        100 mg Oral Daily 03/06/20 1406 03/10/20 1020   03/03/20 1400  ceFAZolin (ANCEF) IVPB 1 g/50 mL premix  Status:  Discontinued        1 g 100 mL/hr over 30 Minutes Intravenous Every 8 hours 03/03/20 1237 03/11/20 1430   03/03/20 1115  valACYclovir (VALTREX) tablet 1,000 mg  Status:  Discontinued        1,000 mg Oral 2 times daily 03/03/20 1106 03/17/20 1335     Objective: Vitals: Today's Vitals   03/16/20 2120 03/16/20 2220 03/17/20 0619 03/17/20 0855  BP:   107/64   Pulse:   89   Resp:   16  Temp:   98 F (36.7 C)   TempSrc:   Oral   SpO2:   98%   Weight:      Height:      PainSc: 7  Asleep  0-No pain    Intake/Output Summary (Last 24 hours) at 03/17/2020 1515 Last data filed at 03/17/2020 0768 Gross per 24 hour  Intake 240 ml  Output 1850 ml  Net -1610 ml   Filed Weights   03/03/20 0942  Weight: (!) 145.2 kg   Weight change:    Intake/Output from previous day: 07/18 0701 - 07/19 0700 In: 480 [P.O.:480] Out: 800 [Urine:800] Intake/Output this shift: Total I/O In: -  Out: 1850 [Urine:1850]  Examination:  General exam: AAO , morbidly obese, NAD, weak appearing. HEENT:Oral mucosa moist, Ear/Nose WNL grossly, dentition normal. Respiratory system: bilaterally clear,no wheezing or crackles,no use of accessory muscle Cardiovascular system: S1 & S2 +, No JVD,. Gastrointestinal system: Abdomen with the skin fold showing multiple areas of cyst/sebecaous vs boil- without tenderness or drainage, healing. Nervous System:Alert, awake, moving extremities and grossly nonfocal Extremities: chronic non pitting edema, distal peripheral pulses palpable.  Skin: No rashes,no icterus. MSK: Normal muscle bulk,tone, power.  Data Reviewed:  I have personally reviewed following labs and imaging studies CBC: Recent Labs  Lab 03/11/20 0620 03/14/20 1043  WBC 18.9* 15.4*  HGB 8.1* 8.0*  HCT 25.0* 25.3*  MCV 95.1 96.9  PLT 256 088   Basic Metabolic Panel: Recent Labs  Lab 03/11/20 0620 03/14/20 1043 03/17/20 0535  NA 138 135  --   K 4.0 4.9  --   CL 102 102  --   CO2 27 26  --   GLUCOSE 101* 119*  --   BUN 12 16  --   CREATININE 0.83 1.04* 0.96  CALCIUM 8.2* 8.3*  --    GFR: Estimated Creatinine Clearance: 81.1 mL/min (by C-G formula based on SCr of 0.96 mg/dL). Liver Function Tests: No results for input(s): AST, ALT, ALKPHOS, BILITOT, PROT, ALBUMIN in the last 168 hours. No results for input(s): LIPASE, AMYLASE in the last 168 hours. No results for input(s): AMMONIA in the last 168 hours. Coagulation Profile: No results for input(s): INR, PROTIME in the last 168 hours. Cardiac Enzymes: No results for input(s): CKTOTAL, CKMB, CKMBINDEX, TROPONINI in the last 168 hours. BNP (last 3 results) No results for input(s): PROBNP in the last 8760 hours. HbA1C: No results for input(s): HGBA1C in the last 72 hours. CBG: Recent Labs  Lab 03/14/20 0714  GLUCAP 83   Lipid Profile: No results for input(s): CHOL, HDL, LDLCALC, TRIG, CHOLHDL, LDLDIRECT in the last 72 hours. Thyroid Function Tests: No results for input(s): TSH, T4TOTAL, FREET4, T3FREE, THYROIDAB in the last 72 hours. Anemia Panel: No results for input(s): VITAMINB12, FOLATE, FERRITIN, TIBC, IRON, RETICCTPCT in the last 72 hours. Sepsis Labs: No results for input(s): PROCALCITON, LATICACIDVEN in the last 168 hours.  No results found for this or any previous visit (from the past 240 hour(s)).   Radiology Studies: No results found.   LOS: 14 days   Antonieta Pert, MD [facility]  03/17/2020, 3:15 PM

## 2020-03-17 NOTE — TOC Progression Note (Signed)
Transition of Care [facility]) - Progression Note    Patient Details  Name: Niomie Englert MRN: [id number] Date of Birth: [birth year]  [facility] CM/SW Contact  Machelle Raybon, Marjie Skiff, RN Phone Number: 03/17/2020, 2:00 PM  Clinical Narrative:    TOC continuing to follow along. This CM contacted [facility] liaison to inquire about insurance auth. Will await call back.    Expected Discharge Plan: Skilled Nursing Facility Barriers to Discharge: Continued Medical Work up  Expected Discharge Plan and Services Expected Discharge Plan: [place]   Discharge Planning Services: CM Consult   Living arrangements for the past 2 months: Single Family Home     Social Determinants of Health (SDOH) Interventions    Readmission Risk Interventions Readmission Risk Prevention Plan 03/06/2020 02/13/2020  Transportation Screening Complete Complete  PCP or Specialist Appt within 3-5 Days Not Complete -  Not Complete comments Unsure of dc date at this time -  [place] or [place] Complete -  Social Work Consult for [place] Planning/Counseling Complete -  Palliative Care Screening Not Applicable -  Medication Review Press photographer) Complete Complete  PCP or Specialist appointment within 3-5 days of discharge - Complete  [place] or [place] - Complete  SW Recovery Care/Counseling Consult - Complete  [place] - Not Applicable  Some recent data might be hidden

## 2020-03-18 ENCOUNTER — Other Ambulatory Visit: Payer: Self-pay | Admitting: Oncology

## 2020-03-18 ENCOUNTER — Ambulatory Visit
Admit: 2020-03-18 | Discharge: 2020-03-18 | Disposition: A | Discharge status: Home / Self Care | Payer: Medicaid Other | Attending: Radiation Oncology | Admitting: Radiation Oncology

## 2020-03-18 DIAGNOSIS — Z170 Estrogen receptor positive status [ER+]: Secondary | ICD-10-CM | POA: Diagnosis not present

## 2020-03-18 DIAGNOSIS — N179 Acute kidney failure, unspecified: Secondary | ICD-10-CM | POA: Diagnosis not present

## 2020-03-18 DIAGNOSIS — C50412 Malignant neoplasm of upper-outer quadrant of left female breast: Secondary | ICD-10-CM | POA: Diagnosis not present

## 2020-03-18 DIAGNOSIS — D6481 Anemia due to antineoplastic chemotherapy: Secondary | ICD-10-CM | POA: Diagnosis not present

## 2020-03-18 DIAGNOSIS — C773 Secondary and unspecified malignant neoplasm of axilla and upper limb lymph nodes: Secondary | ICD-10-CM | POA: Diagnosis not present

## 2020-03-18 DIAGNOSIS — T451X5A Adverse effect of antineoplastic and immunosuppressive drugs, initial encounter: Secondary | ICD-10-CM | POA: Diagnosis not present

## 2020-03-18 DIAGNOSIS — I10 Essential (primary) hypertension: Secondary | ICD-10-CM | POA: Diagnosis not present

## 2020-03-18 DIAGNOSIS — R238 Other skin changes: Secondary | ICD-10-CM | POA: Diagnosis not present

## 2020-03-18 DIAGNOSIS — D72829 Elevated white blood cell count, unspecified: Secondary | ICD-10-CM | POA: Diagnosis not present

## 2020-03-18 NOTE — Progress Notes (Signed)
While assessing patient's skin, I noticed the wounds under her stomach have actually broke open, are yellow with foul odor. Last week I cared for [name] and her wounds in her folds were stage I. Now most of her wounds are stage II with foul discharge. I have informed [name] of the findings. Below are the new wounds measurements:  1 x 0.5 right perineal area 2.5 x 1.5 mid center under fold 5 x 1 left side, horizontally under fold 2 x 0.5 right fold towards thigh area  I have charted the findings under flow sheets as well as put in a WOC consult.

## 2020-03-18 NOTE — Progress Notes (Signed)
Physical Therapy Treatment Patient Details Name: [name] MRN: [id number] DOB: Feb 16, 1956 Today's Date: 03/18/2020    History of Present Illness 64 yo female admitted with skin breakdown/cellulitis, weakness, difficulty functioning at home. Hx of breast ca s/p mastectomy, lymphedema, poor wound healing, venous insufficiency, CKD, morbid obesity, falls    PT Comments    Patient progressing well with mobility and now requires min guard for sit<>stand transfers with RW. Pt with improved tolerance to exercise today and able to complete 1 set of 10 reps for functional LE strengthening. She increased gait distance today to 2x35' with chair follow for safety, pt continues  to get SOB however VSS throughout. RN notified of wound dressings needing to be changed and reports plans to have [place] nurse bring interdry for wound/moisture management today. PT will continue to follow and progress pt's mobility as able.    Follow Up Recommendations  SNF     Equipment Recommendations  None recommended by PT    Recommendations for Other Services       Precautions / Restrictions Precautions Precautions: Fall Precaution Comments: skin breakdown 2* body habitus Restrictions Weight Bearing Restrictions: No Other Position/Activity Restrictions: BMI 60 height 5'1"    Mobility  Bed Mobility               General bed mobility comments: pt in recliner at start of session  Transfers Overall transfer level: Needs assistance Equipment used: Rolling walker (2 wheeled) Transfers: Sit to/from Stand Sit to Stand: Min guard         General transfer comment: pt completed repeated sit<>stands for strengthening from recliner. Pt with good form/technique with single UE for power up from chair. min guard for safety. Additional sit<>stands performed to assist with pericare.  Ambulation/Gait Ambulation/Gait assistance: Min assist;Min guard;+2 safety/equipment Gait Distance (Feet): 70 Feet  (2x35) Assistive device: Rolling walker (2 wheeled) Gait Pattern/deviations: Step-through pattern;Decreased step length - right;Decreased step length - left;Decreased stride length;Wide base of support Gait velocity: decr   General Gait Details: pt with good carryover to maintain safe proximity to RW, no overt LOB noted, pt continues to require extended seated rest breaks between gait and exercises. HR stable and max of 107 seen, SpO2 at 98%.    Stairs             Wheelchair Mobility    Modified Rankin (Stroke Patients Only)       Balance Overall balance assessment: Needs assistance Sitting-balance support: Feet supported Sitting balance-Leahy Scale: Good     Standing balance support: Bilateral upper extremity supported Standing balance-Leahy Scale: Fair           Cognition Arousal/Alertness: Awake/alert Behavior During Therapy: WFL for tasks assessed/performed Overall Cognitive Status: Within Functional Limits for tasks assessed             Exercises Other Exercises Other Exercises: 1x10 reps sit<>stand, single UE for power up. 1x 10 reps bil LE for hamstring curl with orange resistance band.    General Comments        Pertinent Vitals/Pain Pain Assessment: No/denies pain           PT Goals (current goals can now be found in the care plan section) Acute Rehab PT Goals Patient Stated Goal: to get strong enough to go to the beach with grandkids PT Goal Formulation: With patient Time For Goal Achievement: 03/25/20 (extended) Potential to Achieve Goals: Fair Progress towards PT goals: Progressing toward goals    Frequency    Min 3X/week  PT Plan Current plan remains appropriate       AM-PAC PT "6 Clicks" Mobility   Outcome Measure  Help needed turning from your back to your side while in a flat bed without using bedrails?: A Little Help needed moving from lying on your back to sitting on the side of a flat bed without using bedrails?:  A Little Help needed moving to and from a bed to a chair (including a wheelchair)?: A Little Help needed standing up from a chair using your arms (e.g., wheelchair or bedside chair)?: A Little Help needed to walk in hospital room?: A Little Help needed climbing 3-5 steps with a railing? : A Lot 6 Click Score: 17    End of Session Equipment Utilized During Treatment: Gait belt Activity Tolerance: Patient tolerated treatment well Patient left: in chair;with call bell/phone within reach Nurse Communication: Mobility status PT Visit Diagnosis: Muscle weakness (generalized) (M62.81);Pain;Other abnormalities of gait and mobility (R26.89)     Time: 0814-4818 PT Time Calculation (min) (ACUTE ONLY): 40 min  Charges:  $Gait Training: 8-22 mins $Therapeutic Exercise: 8-22 mins $Therapeutic Activity: 8-22 mins                     Verner Mould, DPT Acute Rehabilitation Services  Office [phone] Pager [phone]  03/18/2020 1:33 PM

## 2020-03-18 NOTE — Progress Notes (Addendum)
Radiation Oncology         [phone] ________________________________  Outpatient Follow Up - Conducted via telephone due to current COVID-19 concerns for limiting patient exposure  I spoke with the patient to conduct this consult visit via telephone to spare the patient unnecessary potential exposure in the healthcare setting during the current COVID-19 pandemic. The patient was notified in advance and was offered a [place] meeting to allow for face to face communication but unfortunately reported that they did not have the appropriate resources/technology to support such a visit and instead preferred to proceed with a telephone visit.  Name: [name]        MRN: [id number]  Date of Service: 03/18/2020 DOB: [birth year]  ZJ:QBHA, Cammie, MD   REFERRING PHYSICIAN: Dr. Ninfa Linden  DIAGNOSIS: The encounter diagnosis was Malignant neoplasm of upper-outer quadrant of left breast in female, estrogen receptor positive ([place]).   HISTORY OF PRESENT ILLNESS: [name] is a 64 y.o. female originally seen in the multidisciplinary breast clinic for a new diagnosis of left breast cancer. The patient was noted to have a screening detected mass in the left breast.  The abnormality was noted at the 3 o'clock position in the outer left breast measuring 3.8 x 3.7 x 3.4 cm.  She did have 5 abnormal appearing axillary lymph nodes on the left side.  A biopsy on 11/12/2019 revealed a grade 3 invasive ductal carcinoma with inflammatory features, her tumor was ER positive, PR negative, HER-2 negative with a Ki-67 of 70%.  Her sampled lymph node was also positive for metastatic carcinoma.  She proceeded with MRI of bilateral breasts on 12/07/2019 which revealed the known mass i in the left breast measuring 3.9 x 4.9 x 4.6 cm, there was deviation of the left nipple laterally and inferiorly.  Several enlarged left axillary nodes the largest of which were measured at 2.1 x 2.8 cm in 4-5 other suspicious left axillary nodes  were seen on ultrasound. She proceeded with left mastectomy and axillary lymph node dissection on 12/19/2019, final pathology revealed grade 3 invasive ductal carcinoma measuring 5 cm with prominent lymphocytic response, her margins were negative for carcinoma, and 1 of 13 lymph nodes was positive for disease.  She was offered adjuvant chemotherapy and began Cytoxan Adriamycin on 01/22/2020, chemotherapy has been held after 2 cycles due to poor tolerance, and she is currently hospitalized with pancytopenia, leukocytosis, and significant skin breakdown as a result of urinary incontinence.  This is noted in her inner thighs and perineum. She is planning to discharge to a skilled facility.  She is contacted to discuss options of adjuvant radiotherapy to the chest wall and regional nodes.    PREVIOUS RADIATION THERAPY: No   PAST MEDICAL HISTORY:  Past Medical History:  Diagnosis Date  . Arthritis   . Cancer ([place]) 11/2019   Left breast  . Diverticulitis   . Family history of ovarian cancer   . Heart murmur    11/26/19 echo: Mild AS. AV mean gradient 12.0 mmHg; however, LVOT gradient 8 mmHg with intracvitary gradient-significant AS is not suspected  . Hypertension        PAST SURGICAL HISTORY: Past Surgical History:  Procedure Laterality Date  . MASTECTOMY WITH AXILLARY LYMPH NODE DISSECTION Left 12/19/2019   Procedure: LEFT MASTECTOMY WITH AXILLARY LYMPH NODE DISSECTION;  Surgeon: [name], MD;  Location: [facility];  Service: General;  Laterality: Left;  Marland Kitchen MULTIPLE EXTRACTIONS WITH ALVEOLOPLASTY Bilateral 12/14/2019   Procedure: MULTIPLE EXTRACTION WITH ALVEOLOPLASTY;  Surgeon: [name], DDS;  Location: [facility];  Service: Oral Surgery;  Laterality: Bilateral;  . PORTACATH PLACEMENT N/A 12/19/2019   Procedure: INSERTION PORT-A-CATH WITH ULTRASOUND GUIDANCE;  Surgeon: [name], MD;  Location: [facility];  Service: General;  Laterality: N/A;  . RADIAL HEAD ARTHROPLASTY Right 06/25/2019    Procedure: RIGHT RADIAL HEAD ARTHROPLASTY;  Surgeon: [name], MD;  Location: [facility];  Service: Orthopedics;  Laterality: Right;  . TUBAL LIGATION       FAMILY HISTORY:  Family History  Problem Relation Age of Onset  . Hypertension Mother   . Dementia Mother   . Ovarian cancer Half-Sister 59  . Diabetes Half-Sister   . Stroke Half-Sister      SOCIAL HISTORY:  reports that she has never smoked. She has never used smokeless tobacco. She reports current alcohol use of about 3.0 standard drinks of alcohol per week. She reports current drug use. Drug: Marijuana. The patient is separated. She lives in [place]. She is retired and used to work at [employer] in [employer].   ALLERGIES: Amlodipine   MEDICATIONS:  No current facility-administered medications for this encounter.   No current outpatient medications on file.   Facility-Administered Medications Ordered in Other Encounters  Medication Dose Route Frequency Provider Last Rate Last Admin  . 0.9 %  sodium chloride infusion   Intravenous PRN Donnamae Jude, MD 10 mL/hr at 03/09/20 1708 Rate Verify at 03/09/20 1708  . acetaminophen (TYLENOL) tablet 650 mg  650 mg Oral Q6H PRN Donnamae Jude, MD   650 mg at 03/07/20 1727   Or  . acetaminophen (TYLENOL) suppository 650 mg  650 mg Rectal Q6H PRN Donnamae Jude, MD      . anastrozole (ARIMIDEX) tablet 1 mg  1 mg Oral Daily Magrinat, Virgie Dad, MD   1 mg at 03/18/20 0947  . aspirin EC tablet 81 mg  81 mg Oral Daily Donnamae Jude, MD   81 mg at 03/18/20 0947  . bisacodyl (DULCOLAX) suppository 10 mg  10 mg Rectal Daily PRN Kc, Maren [place], MD      . carvedilol (COREG) tablet 12.5 mg  12.5 mg Oral BID WC Kc, Ramesh, MD   12.5 mg at 03/18/20 0737  . Chlorhexidine Gluconate Cloth 2 % PADS 6 each  6 each Topical Daily Raiford Noble [place], [place]   6 each at 03/18/20 [phone]  . colchicine tablet 0.3 mg  0.3 mg Oral Newman Nip, MD   0.3 mg at 03/18/20 0947  . diphenhydrAMINE (BENADRYL) capsule  25 mg  25 mg Oral Q6H PRN Raiford Noble [place], DO   25 mg at 03/06/20 0809  . enoxaparin (LOVENOX) injection 60 mg  60 mg Subcutaneous Q24H Raiford Noble Eads, DO   60 mg at 03/17/20 2243  . folic acid (FOLVITE) tablet 1 mg  1 mg Oral Daily Kc, Ramesh, MD   1 mg at 03/18/20 0946  . hydrALAZINE (APRESOLINE) tablet 25 mg  25 mg Oral Q8H Kc, Ramesh, MD   25 mg at 03/18/20 1408  . HYDROmorphone (DILAUDID) injection 0.5-1 mg  0.5-1 mg Intravenous Q2H PRN Donnamae Jude, MD   1 mg at 03/09/20 0508  . hydrOXYzine (ATARAX/VISTARIL) tablet 10 mg  10 mg Oral TID PRN Antonieta Pert, MD   10 mg at 03/06/20 1654  . magic mouthwash  5 mL Oral QID PRN Donnamae Jude, MD      . metoprolol tartrate (LOPRESSOR) injection 5 mg  5 mg Intravenous Q6H PRN Donnamae Jude, MD      . ondansetron [facility]) tablet 4 mg  4 mg Oral Q8H PRN Donnamae Jude, MD   4 mg at 03/06/20 0025  . oxyCODONE-acetaminophen (PERCOCET/ROXICET) 5-325 MG per tablet 1 tablet  1 tablet Oral Q6H PRN Antonieta Pert, MD   1 tablet at 03/18/20 0415  . polyethylene glycol (MIRALAX / GLYCOLAX) packet 17 g  17 g Oral BID Kc, Ramesh, MD   17 g at 03/17/20 1002     REVIEW OF SYSTEMS: On review of systems, the patient reports that she is doing better overall since coming into the hospital. She reports she is not having any issues with her chest wall, but has had some LUE lymphedema and lower extremity lymphedema. Her skin in her perineum and thighs have been the main issue with skin breakdown and she's been receiving IV antibiotics. No other complaints are noted.     PHYSICAL EXAM:  Wt Readings from Last 3 Encounters:  03/03/20 (!) 320 lb (145.2 kg)  02/27/20 (!) 320 lb 3.2 oz (145.2 kg)  02/19/20 (!) 315 lb 8 oz (143.1 kg)   Temp Readings from Last 3 Encounters:  03/18/20 98.2 F (36.8 C) (Oral)  02/27/20 98.5 F (36.9 C) (Temporal)  02/21/20 (!) 97.5 F (36.4 C) (Oral)   BP Readings from Last 3 Encounters:  03/18/20 120/72  02/27/20 116/79    02/21/20 (!) 155/92   Pulse Readings from Last 3 Encounters:  03/18/20 83  02/27/20 94  02/21/20 85   Unable to assess due to encounter type.   ECOG = 2  0 - Asymptomatic (Fully active, able to carry on all predisease activities without restriction)  1 - Symptomatic but completely ambulatory (Restricted in physically strenuous activity but ambulatory and able to carry out work of a light or sedentary nature. For example, light housework, office work)  2 - Symptomatic, <50% in bed during the day (Ambulatory and capable of all self care but unable to carry out any work activities. Up and about more than 50% of waking hours)  3 - Symptomatic, >50% in bed, but not bedbound (Capable of only limited self-care, confined to bed or chair 50% or more of waking hours)  4 - Bedbound (Completely disabled. Cannot carry on any self-care. Totally confined to bed or chair)  5 - Death   Eustace Pen MM, Creech RH, Tormey DC, et al. [phone]). "Toxicity and response criteria of the [facility]". E. Lopez Oncol. 5 (6): 649-55    LABORATORY DATA:  Lab Results  Component Value Date   WBC 15.4 (H) 03/14/2020   HGB 8.0 (L) 03/14/2020   HCT 25.3 (L) 03/14/2020   MCV 96.9 03/14/2020   PLT 331 03/14/2020   Lab Results  Component Value Date   NA 135 03/14/2020   K 4.9 03/14/2020   CL 102 03/14/2020   CO2 26 03/14/2020   Lab Results  Component Value Date   ALT 7 03/08/2020   AST 16 03/08/2020   ALKPHOS 72 03/08/2020   BILITOT 0.6 03/08/2020      RADIOGRAPHY: CT ABDOMEN PELVIS W CONTRAST  Result Date: 03/07/2020 CLINICAL DATA:  Generalized abdominal pain. Ongoing diarrhea. History of left breast cancer with current chemotherapy. EXAM: CT ABDOMEN AND PELVIS WITH CONTRAST TECHNIQUE: Multidetector CT imaging of the abdomen and pelvis was performed using the standard protocol following bolus administration of intravenous contrast. CONTRAST:  184m OMNIPAQUE IOHEXOL 300 MG/ML  SOLN COMPARISON:  CT 07/14/2012. Renal ultrasound 01/22/2020 FINDINGS: Lower chest: No focal airspace disease or pleural effusion. There are coronary artery calcifications. Hepatobiliary: Prominent liver spanning 18 cm cranial caudal. Decreased hepatic steatosis from prior exam. Questionable nodular hepatic contours. Decompressed gallbladder without gallstones or pericholecystic inflammation. Pancreas: No ductal dilatation or inflammation. Spleen: Normal in size without focal abnormality. Splenule anteriorly unchanged from prior. Adrenals/Urinary Tract: No adrenal nodule. There is no hydronephrosis. Symmetric bilateral perinephric edema. There are nonobstructing stones in the right kidney, largest measuring 7 mm. Parenchymal calcification in the lower left kidney. There is symmetric renal excretion on 6 minutes delay scan. Partially distended urinary bladder. Air-fluid level in the bladder, Foley catheter in place. Equivocal bladder wall thickening. Stomach/Bowel: Decompressed stomach. No small bowel obstruction or inflammation. Administered enteric contrast reaches the colon. Normal terminal ileum. No appendicitis, the appendix is not well characterized. Enteric contrast reaches the distal colon. Equivocal mild wall thickening of the distal descending and proximal sigmoid colon but no pericolonic inflammation. This is in the region of multiple diverticula. There is no acute diverticulitis. No abnormal rectal distention. Vascular/Lymphatic: Minor aortic atherosclerosis. Patent portal vein. Few lower retroperitoneal nodes that are not enlarged by size criteria. Reproductive: Uterus and bilateral adnexa are unremarkable. Other: No free air or ascites. Subcutaneous edema in the bilateral flank soft tissues. Musculoskeletal: Diffuse degenerative change throughout the spine. Mild anterior wedging of superior endplate of L3 and L4 vertebra, age indeterminate but new from 2019. There are no acute or suspicious osseous  abnormalities. IMPRESSION: 1. Equivocal mild wall thickening of the distal descending and proximal sigmoid colon but no pericolonic inflammation. An element of colitis is considered in the setting of diarrhea. This is in the region of multiple diverticula, but no focal diverticulum. 2. Nonobstructing right nephrolithiasis. 3. Questionable nodular hepatic contours, can be seen with cirrhosis. Recommend correlation with cirrhosis risk factors. 4. Mild anterior wedging of superior endplate of L3 and L4 vertebra, age indeterminate but new from 2013. Aortic Atherosclerosis (ICD10-I70.0). Electronically Signed   By: Keith Rake M.D.   On: 03/07/2020 17:21       IMPRESSION/PLAN: 1. Stage IIA, pT2N1aM0, grade 3, ER/PR positive invasive ductal carcinoma of the left breast. Dr. Lisbeth Renshaw discusses the pathology findings from surgery and reviews the nature of node positive breast disease. She has healed from surgery, and attempted adjuvant chemotherapy however this was quite difficult and she and Dr. Jana Hakim have decided to discontinue systemic therapy. She would benefit from  external radiotherapy to the breast and regional nodes followed by antiestrogen therapy. We discussed the risks, benefits, short, and long term effects of radiotherapy, and the patient is interested in proceeding. Dr. Lisbeth Renshaw discusses the delivery and logistics of radiotherapy and recommends a course of 6 1/2 weeks of radiotherapy to the left chest wall and regional nodes with deep inspiration breath hold technique. She will proceed with simulation on Thursday morning this week. If she remains in the hospital, our staff will come and bring her down to our department. She is in agreement with this plan.   Given current concerns for patient exposure during the COVID-19 pandemic, this encounter was conducted via telephone.  The patient has provided two factor identification and has given verbal consent for this type of encounter and has been  advised to only accept a meeting of this type in a secure network environment. The time spent during this encounter was 45 minutes including preparation, discussion, and coordination of the patient's care. The attendants for  this meeting include  Dr. Lisbeth Renshaw, Hayden Pedro  and [name], and the patient's sister Sheldon Silvan.  During the encounter, Dr. Lisbeth Renshaw, and Hayden Pedro were located at [facility] Radiation Oncology Department.  [name] was located at [facility], her sister Sheldon Silvan was available remotely by phone as well.  The above documentation reflects my direct findings during this shared patient visit. Please see the separate note by Dr. Lisbeth Renshaw on this date for the remainder of the patient's plan of care.    Carola Rhine, PAC

## 2020-03-18 NOTE — TOC Progression Note (Signed)
Transition of Care [facility]) - Progression Note    Patient Details  Name: [name] MRN: [id number] Date of Birth: Sep 22, 1955  Transition of Care [facility]) CM/SW Contact  Jonah Nestle, Marjie Skiff, RN Phone Number: 03/18/2020, 2:09 PM  Clinical Narrative:    This CM has attempted to contact [facility] again today with no return call. Met with pt today and she has now decided to go home. She states that [facility] is sending her Oncologist paperwork to get her personal care services at dc. This CM has contacted 6 home health agencies to get HHRN/PT without success. TOC will continue to work on [facility].   Expected Discharge Plan: Skilled Nursing Facility Barriers to Discharge: Continued Medical Work up  Expected Discharge Plan and Services Expected Discharge Plan: [place]   Discharge Planning Services: CM Consult   Living arrangements for the past 2 months: Single Family Home                                       Social Determinants of Health (SDOH) Interventions    Readmission Risk Interventions Readmission Risk Prevention Plan 03/06/2020 02/13/2020  Transportation Screening Complete Complete  PCP or Specialist Appt within 3-5 Days Not Complete -  Not Complete comments Unsure of dc date at this time -  [place] or [place] Complete -  Social Work Consult for [place] Planning/Counseling Complete -  Palliative Care Screening Not Applicable -  Medication Review Press photographer) Complete Complete  PCP or Specialist appointment within 3-5 days of discharge - Complete  [place] or [place] - Complete  SW Recovery Care/Counseling Consult - Complete  [place] - Not Applicable  Some recent data might be hidden

## 2020-03-18 NOTE — Progress Notes (Signed)
[name]   DOB:Jun 02, 1956   UD#:[id number]   CHY#:[id number]  Subjective:  [name] is tolerating anastrozole with no side effects she is aware of. Her foley is out. She has the purewick in place. She tells me she knows when she has to go. Also tells me the person who helps her out at home is available to resume care. She walked a bit down the hall yesterday and stayed out of bed >6 h by her account. No family in room  Objective: African American woman examined in bed Vitals:   03/17/20 2107 03/18/20 0515  BP: 121/61 117/65  Pulse: 91 88  Resp: 18 18  Temp: 98.4 F (36.9 C) 98.1 F (36.7 C)  SpO2: 98% 97%    Body mass index is 60.46 kg/m.  Intake/Output Summary (Last 24 hours) at 03/18/2020 0725 Last data filed at 03/17/2020 1630 Gross per 24 hour  Intake --  Output 2550 ml  Net -2550 ml     CBG (last 3)  No results for input(s): GLUCAP in the last 72 hours.   Labs:  Lab Results  Component Value Date   WBC 15.4 (H) 03/14/2020   HGB 8.0 (L) 03/14/2020   HCT 25.3 (L) 03/14/2020   MCV 96.9 03/14/2020   PLT 331 03/14/2020   NEUTROABS 18.2 (H) 03/05/2020    '@LASTCHEMISTRY' @  Urine Studies No results for input(s): UHGB, CRYS in the last 72 hours.  Invalid input(s): UACOL, UAPR, USPG, UPH, UTP, UGL, UKET, UBIL, UNIT, UROB, ULEU, UEPI, UWBC, URBC, UBAC, CAST, UCOM, BILUA  Basic Metabolic Panel: Recent Labs  Lab 03/14/20 1043 03/17/20 0535  NA 135  --   K 4.9  --   CL 102  --   CO2 26  --   GLUCOSE 119*  --   BUN 16  --   CREATININE 1.04* 0.96  CALCIUM 8.3*  --    GFR Estimated Creatinine Clearance: 81.1 mL/min (by C-G formula based on SCr of 0.96 mg/dL). Liver Function Tests: No results for input(s): AST, ALT, ALKPHOS, BILITOT, PROT, ALBUMIN in the last 168 hours. No results for input(s): LIPASE, AMYLASE in the last 168 hours. No results for input(s): AMMONIA in the last 168 hours. Coagulation profile No results for input(s): INR, PROTIME in the last 168  hours.  CBC: Recent Labs  Lab 03/14/20 1043  WBC 15.4*  HGB 8.0*  HCT 25.3*  MCV 96.9  PLT 331   Cardiac Enzymes: No results for input(s): CKTOTAL, CKMB, CKMBINDEX, TROPONINI in the last 168 hours. BNP: Invalid input(s): POCBNP CBG: Recent Labs  Lab 03/14/20 0714  GLUCAP 83   D-Dimer No results for input(s): DDIMER in the last 72 hours. Hgb A1c No results for input(s): HGBA1C in the last 72 hours. Lipid Profile No results for input(s): CHOL, HDL, LDLCALC, TRIG, CHOLHDL, LDLDIRECT in the last 72 hours. Thyroid function studies No results for input(s): TSH, T4TOTAL, T3FREE, THYROIDAB in the last 72 hours.  Invalid input(s): FREET3 Anemia work up No results for input(s): VITAMINB12, FOLATE, FERRITIN, TIBC, IRON, RETICCTPCT in the last 72 hours. Microbiology No results found for this or any previous visit (from the past 240 hour(s)).    Studies:  No results found.  Assessment: 64 y.o. [name] woman status post left breast upper outer quadrant and left axillary lymph node biopsies 11/12/2019 for a clinical T2 N2, stage IIIA invasive ductal carcinoma, grade 3, estrogen and progesterone receptor positive, HER-2 not amplified, with an MIB-1 of 70%             (  a) the biopsied axillary lymph node was positive  (1) genetics testing 11/29/2019 through the Breast Cancer STAT Panel offered by [facility] found no deleterious mutations in ATM, BRCA1, BRCA2, CDH1, CHEK2, PALB2, PTEN, STK11 and TP53.  Additional testing through the Common Hereditary Cancers Panel confirmed no deleterious mutations in APC, ATM, AXIN2, BARD1, BMPR1A, BRCA1, BRCA2, BRIP1, CDH1, CDK4, CDKN2A (p14ARF), CDKN2A (p16INK4a), CHEK2, CTNNA1, DICER1, EPCAM (Deletion/duplication testing only), GREM1 (promoter region deletion/duplication testing only), KIT, MEN1, MLH1, MSH2, MSH3, MSH6, MUTYH, NBN, NF1, NHTL1, PALB2, PDGFRA, PMS2, POLD1, POLE, PTEN, RAD50, RAD51C, RAD51D, RNF43, SDHB, SDHC, SDHD, SMAD4, SMARCA4.  STK11, TP53, TSC1, TSC2, and VHL.  The following genes were evaluated for sequence changes only: SDHA and HOXB13 c.251G>A variant only.  (2) status post left modified radical mastectomy 12/19/2019 for a pT2 pN1, stage IIB invasive ductal carcinoma, grade 3, with negative margins.             (a) 1 of 13 axillary lymph nodes removed was positive  (3) adjuvant chemotherapy consisting of cyclophosphamide and doxorubicin in dose dense fashion x4 starting 01/22/2020 to be followed by weekly paclitaxel x12             (a) baseline echocardiogram 11/26/2019 shows an ejection fraction in the 65-70% range.             (b) cyclophosphamide and doxorubicin dose reduced 18% given comorbidities  (c) chemotherapy held after 2 cycles due to poor tolerance  (4) adjuvant radiation pending  (5) anastrozole started 03/15/2020   Plan:  [name] appears much improved and would like to go home instead of to a Rehab facility. I think this is possible as she does have help at home (a lady who comes by several days/week). She should have home health physical therapy.  We are bypassing chemotherapy, continuing anastrozole, and setting her up with radiation oncology to complete her local therapy.  I will make sure she has outpatient follow-up at the cancer center within 2 weeks of discharge.  Please let me know if we can be of further help. Greatly appreciate your help to this patient!     Chauncey Cruel, MD 03/18/2020  7:25 AM Medical Oncology and Hematology [facility] [address] [place], [place] [address] Tel. [phone]    Fax. [phone]

## 2020-03-19 ENCOUNTER — Telehealth: Payer: Self-pay | Admitting: Oncology

## 2020-03-19 DIAGNOSIS — K5900 Constipation, unspecified: Secondary | ICD-10-CM

## 2020-03-19 DIAGNOSIS — C50412 Malignant neoplasm of upper-outer quadrant of left female breast: Secondary | ICD-10-CM | POA: Diagnosis not present

## 2020-03-19 DIAGNOSIS — L03311 Cellulitis of abdominal wall: Principal | ICD-10-CM

## 2020-03-19 DIAGNOSIS — E538 Deficiency of other specified B group vitamins: Secondary | ICD-10-CM

## 2020-03-19 DIAGNOSIS — L98491 Non-pressure chronic ulcer of skin of other sites limited to breakdown of skin: Secondary | ICD-10-CM | POA: Diagnosis not present

## 2020-03-19 DIAGNOSIS — I10 Essential (primary) hypertension: Secondary | ICD-10-CM | POA: Diagnosis not present

## 2020-03-19 DIAGNOSIS — R238 Other skin changes: Secondary | ICD-10-CM | POA: Diagnosis not present

## 2020-03-19 DIAGNOSIS — R5381 Other malaise: Secondary | ICD-10-CM

## 2020-03-19 DIAGNOSIS — M1A09X0 Idiopathic chronic gout, multiple sites, without tophus (tophi): Secondary | ICD-10-CM | POA: Diagnosis not present

## 2020-03-19 DIAGNOSIS — D72829 Elevated white blood cell count, unspecified: Secondary | ICD-10-CM | POA: Diagnosis not present

## 2020-03-19 DIAGNOSIS — D6481 Anemia due to antineoplastic chemotherapy: Secondary | ICD-10-CM | POA: Diagnosis not present

## 2020-03-19 LAB — CBC
HCT: 22.7 % — ABNORMAL LOW (ref 36.0–46.0)
Hemoglobin: 7.1 g/dL — ABNORMAL LOW (ref 12.0–15.0)
MCH: 30.9 pg (ref 26.0–34.0)
MCHC: 31.3 g/dL (ref 30.0–36.0)
MCV: 98.7 fL (ref 80.0–100.0)
Platelets: 266 10*3/uL (ref 150–400)
RBC: 2.3 MIL/uL — ABNORMAL LOW (ref 3.87–5.11)
RDW: 17.3 % — ABNORMAL HIGH (ref 11.5–15.5)
WBC: 7.8 10*3/uL (ref 4.0–10.5)
nRBC: 0 % (ref 0.0–0.2)

## 2020-03-19 LAB — BASIC METABOLIC PANEL
Anion gap: 7 (ref 5–15)
BUN: 15 mg/dL (ref 8–23)
CO2: 27 mmol/L (ref 22–32)
Calcium: 8.5 mg/dL — ABNORMAL LOW (ref 8.9–10.3)
Chloride: 102 mmol/L (ref 98–111)
Creatinine, Ser: 1 mg/dL (ref 0.44–1.00)
GFR calc Af Amer: >60 mL/min (ref >60)
GFR calc non Af Amer: 59 mL/min — ABNORMAL LOW (ref >60)
Glucose, Bld: 90 mg/dL (ref 70–99)
Potassium: 5.1 mmol/L (ref 3.5–5.1)
Sodium: 136 mmol/L (ref 135–145)

## 2020-03-19 LAB — HEMOGLOBIN AND HEMATOCRIT, BLOOD
HCT: 24.5 % — ABNORMAL LOW (ref 36.0–46.0)
Hemoglobin: 7.7 g/dL — ABNORMAL LOW (ref 12.0–15.0)

## 2020-03-19 NOTE — TOC Progression Note (Signed)
Transition of Care [facility]) - Progression Note    Patient Details  Name: [name] MRN: [id number] Date of Birth: 1956-08-01  [facility] [place]) CM/SW Contact  Zalen Sequeira, Marjie Skiff, RN Phone Number: 03/19/2020, 12:50 PM  Clinical Narrative:    This CM met with pt again at bedside for dc planning. Pt states she still wants to go home. She states that her sister will be staying with her 24hrs/day. Pt states she is still waiting to hear back from the private duty company on when they can start at her home. Pt states that she plans to have her sister stay with her until the private duty care is started. Pt requesting a 3in1 and wheelchair for home. Will request MD orders. This CM was able to secure the following home health services with Alvis Lemmings (PT/OT/CSW/Aide) No company was able to provide [facility] at this time due to staffing issues. Pt states that her niece can do her wound care at home. Pt to call niece to have her come to the hospital to receive teaching from the RN on how to do the wound care at home. Staff RN made aware.   Expected Discharge Plan: [place] Barriers to Discharge: Continued Medical Work up  Expected Discharge Plan and Services Expected Discharge Plan: [place]   Discharge Planning Services: CM Consult   Living arrangements for the past 2 months: Apartment                 DME Arranged: Wheelchair manual, 3-N-1 DME Agency: [employer] Date DME Agency Contacted: 03/19/20     HH Arranged: PT, OT, Nurse's Aide, Social Work [employer] Agency: [facility] Date [facility] Agency Contacted: 03/18/20   Representative spoke with at [place]: [place] ([place]) Interventions    Readmission Risk Interventions Readmission Risk Prevention Plan 03/06/2020 02/13/2020  Transportation Screening Complete Complete  PCP or Specialist Appt within 3-5 Days Not Complete -  Not Complete comments Unsure of dc date at this  time -  [place] or [place] Complete -  Social Work Consult for [place] Planning/Counseling Complete -  Palliative Care Screening Not Applicable -  Medication Review Press photographer) Complete Complete  PCP or Specialist appointment within 3-5 days of discharge - Complete  [place] or [place] - Not Applicable  Some recent data might be hidden

## 2020-03-19 NOTE — Progress Notes (Signed)
    Durable Medical Equipment  (From admission, onward)         Start     Ordered   03/19/20 1304  For home use only DME lightweight manual wheelchair with seat cushion  Once       Comments: Patient suffers from weakness which impairs their ability to perform daily activities like walking in the home.  A walker will not resolve  issue with performing activities of daily living. A wheelchair will allow patient to safely perform daily activities. Patient is not able to propel themselves in the home using a standard weight wheelchair due to weakness. Patient can self propel in the lightweight wheelchair. Length of need lifetime. Accessories: elevating leg rests (ELRs), wheel locks, extensions and anti-tippers. Bariatric   03/19/20 1305   03/19/20 1259  For home use only DME 3 n 1  Once       Comments: Bariatric   03/19/20 1302

## 2020-03-19 NOTE — Progress Notes (Addendum)
Occupational Therapy Treatment Patient Details Name: [name] MRN: [id number] DOB: 1956-02-10 Today's Date: 03/19/2020    History of present illness 64 yo female admitted with skin breakdown/cellulitis, weakness, difficulty functioning at home. Hx of breast ca s/p mastectomy, lymphedema, poor wound healing, venous insufficiency, CKD, morbid obesity, falls   OT comments  Patient reports wanting to go home at discharge now.Treatment focused on improving self care tasks in preparation for return home. Patient ambulated to bathroom with RW and performed toilet transfer with grab bar. Patient mod assistance for toileting - patient able to manage hospital gown but needed assistance for wiping. Therapist and patient discussed the use of a toilet aide at home and/or the eventual use of a bidet as well. Patient continues to make progress. Cont POC   Follow Up Recommendations  SNF;Supervision/Assistance - 24 hour    Equipment Recommendations   (bari bsc?)    Recommendations for Other Services      Precautions / Restrictions Precautions Precautions: Fall Precaution Comments: skin breakdown 2* body habitus Restrictions Other Position/Activity Restrictions: BMI 60 height 5'1"       Mobility Bed Mobility                  Transfers Overall transfer level: Needs assistance Equipment used: Rolling walker (2 wheeled)   Sit to Stand: Min guard         General transfer comment: min guard and RW to ambulate to bathroom and return to recliner.    Balance                                           ADL either performed or assessed with clinical judgement   ADL Overall ADL's : Needs assistance/impaired                         Toilet Transfer: Min guard;Ambulation;RW;Grab bars Toilet Transfer Details (indicate cue type and reason): Patient ambulated to the bathroom with min guard and RW and performed toilet transfer with grab bar. Toileting-  Clothing Manipulation and Hygiene: Maximal assistance;Moderate assistance Toileting - Clothing Manipulation Details (indicate cue type and reason): mod assist for toileting - patinet able to manage hospital gown and therapist assisted with wiping/hygiene. patinet reports going home at discharge now. Therapist discussed use of bidet at home for toileting needs. She reports she has a girl that helps her.             Vision       Perception     Praxis      Cognition Arousal/Alertness: Awake/alert Behavior During Therapy: WFL for tasks assessed/performed Overall Cognitive Status: Within Functional Limits for tasks assessed                                          Exercises     Shoulder Instructions       General Comments      Pertinent Vitals/ Pain       Pain Assessment: Faces Faces Pain Scale: Hurts whole lot Pain Location: rectum - during BM Pain Descriptors / Indicators: Grimacing;Moaning  Home Living  Prior Functioning/Environment              Frequency  Min 2X/week        Progress Toward Goals  OT Goals(current goals can now be found in the care plan section)  Progress towards OT goals: Goals met and updated - see care plan  Acute Rehab OT Goals Patient Stated Goal: to get strong enough to go to the beach with grandkids OT Goal Formulation: With patient Time For Goal Achievement: 04/01/20  Plan Discharge plan remains appropriate    Co-evaluation          OT goals addressed during session: ADL's and self-care      AM-PAC OT "6 Clicks" Daily Activity     Outcome Measure   Help from another person eating meals?: None Help from another person taking care of personal grooming?: None Help from another person toileting, which includes using toliet, bedpan, or urinal?: A Lot Help from another person bathing (including washing, rinsing, drying)?: A Lot Help from another  person to put on and taking off regular upper body clothing?: A Little Help from another person to put on and taking off regular lower body clothing?: A Lot 6 Click Score: 17    End of Session Equipment Utilized During Treatment: Rolling walker  OT Visit Diagnosis: Unsteadiness on feet (R26.81);Other abnormalities of gait and mobility (R26.89);Pain   Activity Tolerance Patient tolerated treatment well   Patient Left in chair;with call bell/phone within reach   Nurse Communication Mobility status (Difficulty with BM)        Time: 7199-4129 OT Time Calculation (min): 19 min  Charges: OT General Charges $OT Visit: 1 Visit OT Treatments $Self Care/Home Management : 8-22 mins  Derl Barrow, OTR/L [place]  Office [phone] Pager: [place] 03/19/2020, 12:14 PM

## 2020-03-19 NOTE — Progress Notes (Signed)
PROGRESS NOTE    [name]  GXQ:[id number] DOB: 08-16-1956 DOA: 03/03/2020 PCP: [name], MD    No chief complaint on file.   Brief Narrative:  64 YO female with breast cancer status postmastectomy in April 2021 with negative margins undergoing adjuvant chemo Adriamycin plus cyclophosphamide last dose last week prior to admission, morbidly obese BMI 60, poorly mobile, lymphedema/venous insufficiency/CKD, hypertension recent admission for the inner thighs and perineum, who lives alone and a family member comes in to take care of her, has been incontinent with urine at all times noted to have a significant skin breakdown in the shower, sent to the ED for evaluation  In the ED found to have a lot of skin breakdown maceration several vesicles and also significant leukocytosis anemia, thrombocytopenia elevated BUN/creatinine of 1.6 up from 1.3 on June/30.  Foley catheter was placed, patient was admitted for IV antibiotics IV fluids hydration. Patient is followed by wound care to have Foley in place to allow for optimal wound healing.  Seen by PT OT skilled nursing facility has been recommended.  Initially reluctant and finally after talking with her oncologist agreed to go to SNF.  She is awaiting bed placement.  IV antibiotics has been changed to p.o. Wound care has reevaluated patient is more ambulatory at this time no need to continue Foley catheter and is being discontinued 65/19.  Completed p.o. antibiotics 7/20   Assessment & Plan:   Active Problems:   Essential hypertension   Edema   Malignant neoplasm of upper-outer quadrant of left breast in female, estrogen receptor positive ([place])   Morbid obesity with BMI of 60.0-69.9, adult (HCC)   Acute renal failure (ARF) (HCC)   Gout   Skin breakdown   Skin ulcer of perineum, limited to breakdown of skin (HCC)   Leukocytosis   Anemia associated with chemotherapy   Thrombocytopenia (HCC)   Cellulitis of abdominal wall   Constipation    Folate deficiency   Hypomagnesemia   Debility   1 multiple skin breakdown and cellulitis in abdominal pannus area/perineum/inner thighs Questionable etiology.  Felt likely secondary to poor mobility secondary to morbid obesity with incontinence of urine in the setting of chemotherapy.  Initial lactic acidosis resolved.  Patient had a leukocytosis that has trended down and improved with antibiotic treatment.  Patient afebrile.  Status post full course of antibiotics.  Foley catheter discontinued and patient with a pure wick.  Wound care consulted and following.  Continue current wound care.  Follow.  2.  Leukocytosis Felt likely secondary to problem #1 and recent Neulasta.  Leukocytosis trended down.  Status post full course of antibiotics.  Follow.  3.  Oral thrush Status post full course of Diflucan.  4.  Hypertension Well-controlled on current regimen of Coreg, hydralazine.  Follow.  5.  Acute kidney injury/metabolic acidosis Improved.  6.  Constipation Improved.  Continue current bowel regimen of MiraLAX twice daily.  Follow.  7.  Debility/deconditioning/poor mobility/weakness Patient seen by PT OT who are recommending SNF placement.  Social work noted however difficulty placing patient in SNF due to insurance.  Patient may need to go home with home health therapies.  Follow.  8.  Folate deficiency/anemia of chronic disease Hemoglobin on admission was 7.9 which dropped as low as 6.9.  Status post transfusion of 1 unit packed red blood cells.  Hemoglobin at 7.1.  Repeat H&H this afternoon.  Transfusion threshold hemoglobin <7.  Continue folic acid.  Follow.  9.  T2 N1 stage IIb  grade 3 ER positive invasive ductal carcinoma status post left modified radical mastectomy 12/19/2019 Being followed by oncology.  Patient started on anastrozole 03/15/2020.  Patient to be set up with radiation oncology to complete local therapy.  Patient for simulation tomorrow.  Will need outpatient follow-up  with oncology.  Follow.  10.  Gout Stable.  Continue colchicine.  11.  Hypomagnesemia Continue oral magnesium oxide.  Outpatient follow-up.  12.  Obesity with BMI of 60 Outpatient follow-up for healthy lifestyle/weight loss strategy.  #13 pressure ulcer on buttocks, POA Continue current wound care.  Pressure Injury 12/19/19 Tibial Distal;Left;Posterior Stage 2 -  Partial thickness loss of dermis presenting as a shallow open injury with a red, pink wound bed without slough. Pink, red (Active)  12/19/19 1903  Location: Tibial  Location Orientation: Distal;Left;Posterior  Staging: Stage 2 -  Partial thickness loss of dermis presenting as a shallow open injury with a red, pink wound bed without slough.  Wound Description (Comments): Pink, red  Present on Admission: Yes     Pressure Injury 12/19/19 Sacrum Medial Stage 2 -  Partial thickness loss of dermis presenting as a shallow open injury with a red, pink wound bed without slough. Pink, red (Active)  12/19/19 1904  Location: Sacrum  Location Orientation: Medial  Staging: Stage 2 -  Partial thickness loss of dermis presenting as a shallow open injury with a red, pink wound bed without slough.  Wound Description (Comments): Pink, red  Present on Admission: Yes     Pressure Injury 03/03/20 Buttocks Posterior;Left Stage 3 -  Full thickness tissue loss. Subcutaneous fat may be visible but bone, tendon or muscle are NOT exposed. (Active)  03/03/20 2308  Location: Buttocks  Location Orientation: Posterior;Left  Staging: Stage 3 -  Full thickness tissue loss. Subcutaneous fat may be visible but bone, tendon or muscle are NOT exposed.  Wound Description (Comments):   Present on Admission: Yes     Pressure Injury 03/03/20 Buttocks Right;Posterior Stage 2 -  Partial thickness loss of dermis presenting as a shallow open injury with a red, pink wound bed without slough. (Active)  03/03/20 2308  Location: Buttocks  Location Orientation:  Right;Posterior  Staging: Stage 2 -  Partial thickness loss of dermis presenting as a shallow open injury with a red, pink wound bed without slough.  Wound Description (Comments):   Present on Admission: Yes        DVT prophylaxis: Lovenox Code Status: Full Family Communication: Updated patient.  No family at bedside. Disposition:   Status is: Inpatient    Dispo: The patient is from: Home              Anticipated d/c is to: Home with home health versus SNF              Anticipated d/c date is: 1 to 2 days.              Patient currently being set up with home health therapies, also awaiting SNF placement if possible.  Patient also for simulation for radiation treatment tomorrow.       Consultants:   Oncology: Dr. Jana Hakim  Wound care nurse, Melody Liane Comber, RN/[place], RN  Procedures:   CT abdomen and pelvis 03/07/2020    Antimicrobials:   Diflucan 03/06/2020>>>> 03/10/2020  Valtrex 03/03/2020>>>> 03/17/2020  IV Ancef 03/03/2020>>>>> 03/11/2020  Keflex 03/11/2020>>>> 03/17/2020   Subjective: Patient sitting up in chair.  Denies any chest pain or shortness of breath.  No nausea or  emesis.  Stating that her insurance would not pay for SNF and if unable to get into a SNF would prefer to go home with home health therapies.  Patient states has family that I able to come and assist with her over the weekend and also has help 5 days a week.  No overt bleeding.  Objective: Vitals:   03/18/20 1252 03/18/20 2203 03/19/20 0518 03/19/20 1348  BP: 120/72 (!) 118/57 132/63 115/69  Pulse: 83 92 88 83  Resp: 16 17 17 15   Temp: 98.2 F (36.8 C) 98.7 F (37.1 C) 98.2 F (36.8 C) 98.1 F (36.7 C)  TempSrc: Oral Oral Oral Oral  SpO2: 99% 98% 98% 99%  Weight:      Height:        Intake/Output Summary (Last 24 hours) at 03/19/2020 1736 Last data filed at 03/19/2020 0500 Gross per 24 hour  Intake --  Output 1500 ml  Net -1500 ml   Filed Weights   03/03/20 0942  Weight: (!)  145.2 kg    Examination:  General exam: Appears calm and comfortable  Respiratory system: Clear to auscultation. Respiratory effort normal. Cardiovascular system: S1 & S2 heard, RRR. No JVD, murmurs, rubs, gallops or clicks. No pedal edema. Gastrointestinal system: Abdomen is obese, nondistended, soft and nontender.  Skin fold areas underneath moist with some multiple areas of cyst/sebum glands, healing wounds noted.  Interdry in folds.  Central nervous system: Alert and oriented. No focal neurological deficits. Extremities: Symmetric 5 x 5 power. Skin: Some skin breakdown underneath inner thighs.  Psychiatry: Judgement and insight appear normal. Mood & affect appropriate.     Data Reviewed: I have personally reviewed following labs and imaging studies  CBC: Recent Labs  Lab 03/14/20 1043 03/19/20 0540 03/19/20 1241  WBC 15.4* 7.8  --   HGB 8.0* 7.1* 7.7*  HCT 25.3* 22.7* 24.5*  MCV 96.9 98.7  --   PLT 331 266  --     Basic Metabolic Panel: Recent Labs  Lab 03/14/20 1043 03/17/20 0535 03/19/20 0540  NA 135  --  136  K 4.9  --  5.1  CL 102  --  102  CO2 26  --  27  GLUCOSE 119*  --  90  BUN 16  --  15  CREATININE 1.04* 0.96 1.00  CALCIUM 8.3*  --  8.5*    GFR: Estimated Creatinine Clearance: 77.9 mL/min (by C-G formula based on SCr of 1 mg/dL).  Liver Function Tests: No results for input(s): AST, ALT, ALKPHOS, BILITOT, PROT, ALBUMIN in the last 168 hours.  CBG: Recent Labs  Lab 03/14/20 0714  GLUCAP 83     No results found for this or any previous visit (from the past 240 hour(s)).       Radiology Studies: No results found.      Scheduled Meds: . anastrozole  1 mg Oral Daily  . aspirin EC  81 mg Oral Daily  . carvedilol  12.5 mg Oral BID WC  . Chlorhexidine Gluconate Cloth  6 each Topical Daily  . colchicine  0.3 mg Oral QODAY  . enoxaparin (LOVENOX) injection  60 mg Subcutaneous Q24H  . folic acid  1 mg Oral Daily  . hydrALAZINE  25 mg  Oral Q8H  . polyethylene glycol  17 g Oral BID   Continuous Infusions: . sodium chloride 10 mL/hr at 03/09/20 1708     LOS: 16 days    Time spent: 35 minutes    Quillian Quince  [place]ville Silos, MD [facility]   To contact the attending provider between 7A-7P or the covering provider during after hours 7P-7A, please log into the web site [email or web address] and access using universal [place] password for that web site. If you do not have the password, please call the hospital operator.  03/19/2020, 5:36 PM

## 2020-03-19 NOTE — Telephone Encounter (Signed)
scheduled appt per 7/29 sch msg - pt is aware of appt date and time

## 2020-03-19 NOTE — Consult Note (Signed)
[facility] Nurse Consult Note: Reason for Consult:new areas of skin breakdown related to moisture associated skin damage from incontinence and intertriginous skin damage from moisture Wound type:partial and full thickness ulcerations under the pannus and perineal areas   Pressure Injury POA: NA Measurement: 1 x 0.5 right perineal area 2.5 x 1.5 mid center under fold 5 x 1 left side, horizontally under fold 2 x 0.5 right fold towards thigh area Wound bed: described as stage two with odor, not uncommon with location of skin breakdown Drainage (amount, consistency, odor) see nursing flow sheets Periwound:intact Dressing procedure/placement/frequency: Added silver hydrofiber for exudate,antimicrobial effects. May need to restart foley if patient is not safe to transfer for BM and urination and if Purewick is not controlling moisture.   Discussed POC with patient and bedside nurse.  Re consult if needed, will not follow at this time. Thanks  Keen Ewalt [employer], RN,CWOCN, CNS, [place] [phone])

## 2020-03-20 ENCOUNTER — Ambulatory Visit
Admit: 2020-03-20 | Discharge: 2020-03-20 | Disposition: A | Discharge status: Home / Self Care | Payer: Medicaid Other | Source: Ambulatory Visit | Attending: Radiation Oncology | Admitting: Radiation Oncology

## 2020-03-20 DIAGNOSIS — L98491 Non-pressure chronic ulcer of skin of other sites limited to breakdown of skin: Secondary | ICD-10-CM | POA: Diagnosis not present

## 2020-03-20 DIAGNOSIS — E538 Deficiency of other specified B group vitamins: Secondary | ICD-10-CM | POA: Diagnosis not present

## 2020-03-20 DIAGNOSIS — D6481 Anemia due to antineoplastic chemotherapy: Secondary | ICD-10-CM | POA: Diagnosis not present

## 2020-03-20 DIAGNOSIS — M1A09X0 Idiopathic chronic gout, multiple sites, without tophus (tophi): Secondary | ICD-10-CM | POA: Diagnosis not present

## 2020-03-20 DIAGNOSIS — L03311 Cellulitis of abdominal wall: Secondary | ICD-10-CM | POA: Diagnosis not present

## 2020-03-20 DIAGNOSIS — Z510 Encounter for antineoplastic radiation therapy: Secondary | ICD-10-CM | POA: Insufficient documentation

## 2020-03-20 DIAGNOSIS — K5900 Constipation, unspecified: Secondary | ICD-10-CM | POA: Diagnosis not present

## 2020-03-20 DIAGNOSIS — Z170 Estrogen receptor positive status [ER+]: Secondary | ICD-10-CM | POA: Insufficient documentation

## 2020-03-20 DIAGNOSIS — I10 Essential (primary) hypertension: Secondary | ICD-10-CM | POA: Diagnosis not present

## 2020-03-20 DIAGNOSIS — C50412 Malignant neoplasm of upper-outer quadrant of left female breast: Secondary | ICD-10-CM | POA: Diagnosis not present

## 2020-03-20 DIAGNOSIS — R238 Other skin changes: Secondary | ICD-10-CM | POA: Diagnosis not present

## 2020-03-20 DIAGNOSIS — D72829 Elevated white blood cell count, unspecified: Secondary | ICD-10-CM | POA: Diagnosis not present

## 2020-03-20 LAB — BASIC METABOLIC PANEL
Anion gap: 6 (ref 5–15)
BUN: 15 mg/dL (ref 8–23)
CO2: 28 mmol/L (ref 22–32)
Calcium: 8.8 mg/dL — ABNORMAL LOW (ref 8.9–10.3)
Chloride: 102 mmol/L (ref 98–111)
Creatinine, Ser: 1.11 mg/dL — ABNORMAL HIGH (ref 0.44–1.00)
GFR calc Af Amer: >60 mL/min (ref >60)
GFR calc non Af Amer: 52 mL/min — ABNORMAL LOW (ref >60)
Glucose, Bld: 89 mg/dL (ref 70–99)
Potassium: 5.2 mmol/L — ABNORMAL HIGH (ref 3.5–5.1)
Sodium: 136 mmol/L (ref 135–145)

## 2020-03-20 LAB — CBC
HCT: 22.5 % — ABNORMAL LOW (ref 36.0–46.0)
Hemoglobin: 7 g/dL — ABNORMAL LOW (ref 12.0–15.0)
MCH: 30.8 pg (ref 26.0–34.0)
MCHC: 31.1 g/dL (ref 30.0–36.0)
MCV: 99.1 fL (ref 80.0–100.0)
Platelets: 279 10*3/uL (ref 150–400)
RBC: 2.27 MIL/uL — ABNORMAL LOW (ref 3.87–5.11)
RDW: 17.4 % — ABNORMAL HIGH (ref 11.5–15.5)
WBC: 7.1 10*3/uL (ref 4.0–10.5)
nRBC: 0 % (ref 0.0–0.2)

## 2020-03-20 LAB — PREPARE RBC (CROSSMATCH)

## 2020-03-20 LAB — HEMOGLOBIN AND HEMATOCRIT, BLOOD
HCT: 21.8 % — ABNORMAL LOW (ref 36.0–46.0)
Hemoglobin: 6.7 g/dL — CL (ref 12.0–15.0)

## 2020-03-20 LAB — POTASSIUM: Potassium: 4.4 mmol/L (ref 3.5–5.1)

## 2020-03-20 MED ORDER — SODIUM CHLORIDE 0.9% IV SOLUTION: Freq: Once | INTRAVENOUS | Status: DC

## 2020-03-20 MED ORDER — FUROSEMIDE 10 MG/ML IJ SOLN
20.0000 mg | Freq: Once | INTRAMUSCULAR | Status: AC
  Administered 2020-03-20: 20 mg via INTRAVENOUS
  Filled 2020-03-20: qty 2

## 2020-03-20 MED ORDER — ACETAMINOPHEN 325 MG PO TABS
650.0000 mg | ORAL_TABLET | Freq: Once | ORAL | Status: AC
  Administered 2020-03-20: 650 mg via ORAL
  Filled 2020-03-20: qty 2

## 2020-03-20 MED ORDER — DIPHENHYDRAMINE HCL 25 MG PO CAPS
25.0000 mg | ORAL_CAPSULE | Freq: Once | ORAL | Status: AC
  Administered 2020-03-20: 25 mg via ORAL
  Filled 2020-03-20: qty 1

## 2020-03-20 NOTE — Progress Notes (Signed)
Physical Therapy Treatment Patient Details Name: [name] MRN: [id number] DOB: 1956-02-06 Today's Date: 03/20/2020    History of Present Illness 64 yo female admitted with skin breakdown/cellulitis, weakness, difficulty functioning at home. Hx of breast ca s/p mastectomy, lymphedema, poor wound healing, venous insufficiency, CKD, morbid obesity, falls    PT Comments    Pt demonstrating gradual progress.  Needs cues for safe hand placement and exercise technique to prevent  compensation.  Pt was able to ambulate 49' with RW but with DOE of 3/4, VSS.  Cont POC.     Follow Up Recommendations  SNF     Equipment Recommendations  None recommended by PT    Recommendations for Other Services       Precautions / Restrictions Precautions Precautions: Fall Precaution Comments: skin breakdown 2* body habitus Restrictions Other Position/Activity Restrictions: BMI 60 height 5'1"    Mobility  Bed Mobility               General bed mobility comments: pt in recliner at start of session  Transfers Overall transfer level: Needs assistance Equipment used: Rolling walker (2 wheeled) Transfers: Sit to/from Stand Sit to Stand: Min guard         General transfer comment: Cues for safe hand placement; sit to stand x 2 for tx and x 5 for exercise  Ambulation/Gait Ambulation/Gait assistance: Min guard Gait Distance (Feet): 75 Feet Assistive device: Rolling walker (2 wheeled) Gait Pattern/deviations: Step-through pattern;Decreased stride length;Wide base of support     General Gait Details: Pt with stable VS but DOE of 3/4 with gait. Required cues for posture but did well with RW without cues.   Stairs             Wheelchair Mobility    Modified Rankin (Stroke Patients Only)       Balance Overall balance assessment: Needs assistance Sitting-balance support: Feet supported Sitting balance-Leahy Scale: Good     Standing balance support: Bilateral upper  extremity supported Standing balance-Leahy Scale: Poor Standing balance comment: required UE support                            Cognition Arousal/Alertness: Awake/alert Behavior During Therapy: WFL for tasks assessed/performed Overall Cognitive Status: Within Functional Limits for tasks assessed                                        Exercises General Exercises - Lower Extremity Ankle Circles/Pumps: AROM;10 reps;Seated Other Exercises Other Exercises: 1x5 sit to stand; HS curl and LAQ with Orange T band x 10 ; hip abduction x 10 in long sitting   General Comments General comments (skin integrity, edema, etc.): Required rest breaks with activity      Pertinent Vitals/Pain Pain Assessment: No/denies pain    Home Living                      Prior Function            PT Goals (current goals can now be found in the care plan section) Acute Rehab PT Goals Patient Stated Goal: to get strong enough to go to the beach with grandkids PT Goal Formulation: With patient Time For Goal Achievement: 03/25/20 Potential to Achieve Goals: Fair Progress towards PT goals: Progressing toward goals    Frequency    Min 3X/week  PT Plan Current plan remains appropriate    Co-evaluation              AM-PAC PT "6 Clicks" Mobility   Outcome Measure  Help needed turning from your back to your side while in a flat bed without using bedrails?: A Little Help needed moving from lying on your back to sitting on the side of a flat bed without using bedrails?: A Little Help needed moving to and from a bed to a chair (including a wheelchair)?: A Little Help needed standing up from a chair using your arms (e.g., wheelchair or bedside chair)?: A Little Help needed to walk in hospital room?: A Little Help needed climbing 3-5 steps with a railing? : A Lot 6 Click Score: 17    End of Session Equipment Utilized During Treatment: Gait belt Activity  Tolerance: Patient tolerated treatment well Patient left: in chair;with call bell/phone within reach Nurse Communication: Mobility status PT Visit Diagnosis: Muscle weakness (generalized) (M62.81);Pain;Other abnormalities of gait and mobility (R26.89)     Time: 8242-3536 PT Time Calculation (min) (ACUTE ONLY): 24 min  Charges:  $Gait Training: 8-22 mins $Therapeutic Exercise: 8-22 mins                     Abran Richard, PT Acute Rehab Services Pager [phone] [facility] 03/20/2020, 1:55 PM

## 2020-03-20 NOTE — Progress Notes (Signed)
PROGRESS NOTE    [name]  WNI:[id number] DOB: 1956/04/19 DOA: 03/03/2020 PCP: [name], MD    No chief complaint on file.   Brief Narrative:  64 YO female with breast cancer status postmastectomy in April 2021 with negative margins undergoing adjuvant chemo Adriamycin plus cyclophosphamide last dose last week prior to admission, morbidly obese BMI 60, poorly mobile, lymphedema/venous insufficiency/CKD, hypertension recent admission for the inner thighs and perineum, who lives alone and a family member comes in to take care of her, has been incontinent with urine at all times noted to have a significant skin breakdown in the shower, sent to the ED for evaluation  In the ED found to have a lot of skin breakdown maceration several vesicles and also significant leukocytosis anemia, thrombocytopenia elevated BUN/creatinine of 1.6 up from 1.3 on June/30.  Foley catheter was placed, patient was admitted for IV antibiotics IV fluids hydration. Patient is followed by wound care to have Foley in place to allow for optimal wound healing.  Seen by PT OT skilled nursing facility has been recommended.  Initially reluctant and finally after talking with her oncologist agreed to go to SNF.  She is awaiting bed placement.  IV antibiotics has been changed to p.o. Wound care has reevaluated patient is more ambulatory at this time no need to continue Foley catheter and is being discontinued 7/19.  Completed p.o. antibiotics 7/20   Assessment & Plan:   Active Problems:   Essential hypertension   Edema   Malignant neoplasm of upper-outer quadrant of left breast in female, estrogen receptor positive ([place])   Morbid obesity with BMI of 60.0-69.9, adult (HCC)   Acute renal failure (ARF) (HCC)   Gout   Skin breakdown   Skin ulcer of perineum, limited to breakdown of skin (HCC)   Leukocytosis   Anemia associated with chemotherapy   Thrombocytopenia (HCC)   Cellulitis of abdominal wall   Constipation    Folate deficiency   Hypomagnesemia   Debility   1 multiple skin breakdown and cellulitis in abdominal pannus area/perineum/inner thighs Questionable etiology.  Felt likely secondary to poor mobility secondary to morbid obesity with incontinence of urine in the setting of chemotherapy.  Initial lactic acidosis resolved.  Patient had a leukocytosis that has trended down and improved with antibiotic treatment.  Patient afebrile.  Status post full course of antibiotics.  Foley catheter discontinued and patient with a pure wick.  Wound care consulted and following.  Continue current wound care.  Follow.  2.  Leukocytosis Felt likely secondary to problem #1 and recent Neulasta.  Leukocytosis trended down.  Status post full course of antibiotics.  Follow.  3.  Oral thrush Completed a course of Diflucan.    4.  Hypertension Controlled on current regimen of hydralazine and Coreg.   5.  Acute kidney injury/metabolic acidosis Improved.  6.  Constipation Continue current bowel regimen of MiraLAX twice daily.  Follow.    7.  Debility/deconditioning/poor mobility/weakness Patient seen by PT/ OT who are recommending SNF placement.  Social work noted however difficulty placing patient in SNF due to insurance authorization.  Patient likely to go home with home health therapies.  Follow.    8.  Folate deficiency/anemia of chronic disease Hemoglobin on admission was 7.9 which dropped as low as 6.9.  Status post transfusion of 1 unit packed red blood cells.  Hemoglobin at 7.0 this morning.  Repeat hemoglobin at 6.7 this afternoon.  Transfuse 2 units packed red blood cells.  Follow H&H.  Continue folic acid.  Outpatient follow-up.   9.  T2 N1 stage IIb grade 3 ER positive invasive ductal carcinoma status post left modified radical mastectomy 12/19/2019 Being followed by oncology.  Patient started on anastrozole 03/15/2020.  Patient to be set up with radiation oncology to complete local therapy.  Patient  underwent simulation today for radiation.  Will need outpatient follow-up with radiation oncology/oncology.   10.  Gout Continue colchicine.  Stable.   11.  Hypomagnesemia Continue oral magnesium oxide.  Outpatient follow-up.  12.  Obesity with BMI of 60 Outpatient follow-up for healthy lifestyle/weight loss strategy.  #13 pressure ulcer on buttocks, POA Continue current wound care.  Pressure Injury 12/19/19 Tibial Distal;Left;Posterior Stage 2 -  Partial thickness loss of dermis presenting as a shallow open injury with a red, pink wound bed without slough. Pink, red (Active)  12/19/19 1903  Location: Tibial  Location Orientation: Distal;Left;Posterior  Staging: Stage 2 -  Partial thickness loss of dermis presenting as a shallow open injury with a red, pink wound bed without slough.  Wound Description (Comments): Pink, red  Present on Admission: Yes     Pressure Injury 12/19/19 Sacrum Medial Stage 2 -  Partial thickness loss of dermis presenting as a shallow open injury with a red, pink wound bed without slough. Pink, red (Active)  12/19/19 1904  Location: Sacrum  Location Orientation: Medial  Staging: Stage 2 -  Partial thickness loss of dermis presenting as a shallow open injury with a red, pink wound bed without slough.  Wound Description (Comments): Pink, red  Present on Admission: Yes     Pressure Injury 03/03/20 Buttocks Posterior;Left Stage 3 -  Full thickness tissue loss. Subcutaneous fat may be visible but bone, tendon or muscle are NOT exposed. (Active)  03/03/20 2308  Location: Buttocks  Location Orientation: Posterior;Left  Staging: Stage 3 -  Full thickness tissue loss. Subcutaneous fat may be visible but bone, tendon or muscle are NOT exposed.  Wound Description (Comments):   Present on Admission: Yes     Pressure Injury 03/03/20 Buttocks Right;Posterior Stage 2 -  Partial thickness loss of dermis presenting as a shallow open injury with a red, pink wound bed  without slough. (Active)  03/03/20 2308  Location: Buttocks  Location Orientation: Right;Posterior  Staging: Stage 2 -  Partial thickness loss of dermis presenting as a shallow open injury with a red, pink wound bed without slough.  Wound Description (Comments):   Present on Admission: Yes        DVT prophylaxis: Lovenox Code Status: Full Family Communication: Updated patient.  No family at bedside. Disposition:   Status is: Inpatient    Dispo: The patient is from: Home              Anticipated d/c is to: Home with home health versus SNF              Anticipated d/c date is: 03/21/2020              Patient currently being set up with home health therapies, also awaiting SNF placement if possible.  Patient with hemoglobin down to 6.7.  Transfused 2 units packed red blood cells.  If hemoglobin stable tomorrow could likely be discharged home with home health.         Consultants:   Oncology: Dr. Jana Hakim  Wound care nurse, Melody Liane Comber, RN/Dawn Barbie Haggis, RN  Procedures:   CT abdomen and pelvis 03/07/2020  Transfused 2 units packed red blood cells  03/20/2020  Transfuse 1 unit packed red blood cells 03/06/2020  Antimicrobials:   Diflucan 03/06/2020>>>> 03/10/2020  Valtrex 03/03/2020>>>> 03/17/2020  IV Ancef 03/03/2020>>>>> 03/11/2020  Keflex 03/11/2020>>>> 03/17/2020   Subjective: Patient sleeping recliner.  Denies chest pain or shortness of breath.  No nausea or emesis.  Feeling better.  Just not hungry at this time.  Denies any overt bleeding.    Objective: Vitals:   03/19/20 0518 03/19/20 1348 03/19/20 2013 03/20/20 0620  BP: 132/63 115/69 (!) 105/49 103/61  Pulse: 88 83 84 90  Resp: 17 15 18 18   Temp: 98.2 F (36.8 C) 98.1 F (36.7 C) 98.8 F (37.1 C) 98.5 F (36.9 C)  TempSrc: Oral Oral Oral Oral  SpO2: 98% 99% 100% 98%  Weight:      Height:        Intake/Output Summary (Last 24 hours) at 03/20/2020 1219 Last data filed at 03/19/2020 1819 Gross per 24 hour    Intake --  Output 750 ml  Net -750 ml   Filed Weights   03/03/20 0942  Weight: (!) 145.2 kg    Examination:  General exam: NAD Respiratory system:  CTAB.  No wheezes, no crackles, no rhonchi.  Normal respiratory effort. Cardiovascular system: Regular rate and rhythm no murmurs rubs or gallops.  No JVD.  No lower extremity edema.  Gastrointestinal system: Abdomen is nondistended, obese, soft, nontender to palpation. Skin fold areas underneath moist with some multiple areas of cyst/sebum glands, healing wounds noted.  Interdry in folds.  Central nervous system: Alert and oriented. No focal neurological deficits. Extremities: Symmetric 5 x 5 power. Skin: Some skin breakdown underneath inner thighs.  Psychiatry: Judgement and insight appear normal. Mood & affect appropriate.     Data Reviewed: I have personally reviewed following labs and imaging studies  CBC: Recent Labs  Lab 03/14/20 1043 03/19/20 0540 03/19/20 1241 03/20/20 0533  WBC 15.4* 7.8  --  7.1  HGB 8.0* 7.1* 7.7* 7.0*  HCT 25.3* 22.7* 24.5* 22.5*  MCV 96.9 98.7  --  99.1  PLT 331 266  --  094    Basic Metabolic Panel: Recent Labs  Lab 03/14/20 1043 03/17/20 0535 03/19/20 0540 03/20/20 0533  NA 135  --  136 136  K 4.9  --  5.1 5.2*  CL 102  --  102 102  CO2 26  --  27 28  GLUCOSE 119*  --  90 89  BUN 16  --  15 15  CREATININE 1.04* 0.96 1.00 1.11*  CALCIUM 8.3*  --  8.5* 8.8*    GFR: Estimated Creatinine Clearance: 70.2 mL/min (A) (by C-G formula based on SCr of 1.11 mg/dL (H)).  Liver Function Tests: No results for input(s): AST, ALT, ALKPHOS, BILITOT, PROT, ALBUMIN in the last 168 hours.  CBG: Recent Labs  Lab 03/14/20 0714  GLUCAP 83     No results found for this or any previous visit (from the past 240 hour(s)).       Radiology Studies: No results found.      Scheduled Meds: . anastrozole  1 mg Oral Daily  . aspirin EC  81 mg Oral Daily  . carvedilol  12.5 mg Oral BID  WC  . Chlorhexidine Gluconate Cloth  6 each Topical Daily  . colchicine  0.3 mg Oral QODAY  . enoxaparin (LOVENOX) injection  60 mg Subcutaneous Q24H  . folic acid  1 mg Oral Daily  . hydrALAZINE  25 mg Oral Q8H  . polyethylene glycol  17 g Oral BID   Continuous Infusions: . sodium chloride 10 mL/hr at 03/09/20 1708     LOS: 17 days    Time spent: 35 minutes    Irine Seal, MD [facility]   To contact the attending provider between 7A-7P or the covering provider during after hours 7P-7A, please log into the web site [email or web address] and access using universal [place] password for that web site. If you do not have the password, please call the hospital operator.  03/20/2020, 12:19 PM

## 2020-03-20 NOTE — Progress Notes (Signed)
CRITICAL VALUE ALERT  Critical Value:  HGB 6.7  Date & Time Notied:  03/20/2020 @ 1300  Provider Notified: Cline Cools, MD  Orders Received/Actions taken: PENDING

## 2020-03-21 DIAGNOSIS — C50412 Malignant neoplasm of upper-outer quadrant of left female breast: Secondary | ICD-10-CM | POA: Diagnosis not present

## 2020-03-21 DIAGNOSIS — K5900 Constipation, unspecified: Secondary | ICD-10-CM | POA: Diagnosis not present

## 2020-03-21 DIAGNOSIS — I10 Essential (primary) hypertension: Secondary | ICD-10-CM | POA: Diagnosis not present

## 2020-03-21 DIAGNOSIS — D72829 Elevated white blood cell count, unspecified: Secondary | ICD-10-CM | POA: Diagnosis not present

## 2020-03-21 DIAGNOSIS — M1A09X0 Idiopathic chronic gout, multiple sites, without tophus (tophi): Secondary | ICD-10-CM | POA: Diagnosis not present

## 2020-03-21 DIAGNOSIS — E538 Deficiency of other specified B group vitamins: Secondary | ICD-10-CM | POA: Diagnosis not present

## 2020-03-21 DIAGNOSIS — N179 Acute kidney failure, unspecified: Secondary | ICD-10-CM | POA: Diagnosis not present

## 2020-03-21 DIAGNOSIS — L03311 Cellulitis of abdominal wall: Secondary | ICD-10-CM | POA: Diagnosis not present

## 2020-03-21 DIAGNOSIS — R5381 Other malaise: Secondary | ICD-10-CM | POA: Diagnosis not present

## 2020-03-21 DIAGNOSIS — D6481 Anemia due to antineoplastic chemotherapy: Secondary | ICD-10-CM | POA: Diagnosis not present

## 2020-03-21 LAB — TYPE AND SCREEN
ABO/RH(D): A POS
Antibody Screen: NEGATIVE
Unit division: 0
Unit division: 0

## 2020-03-21 LAB — BPAM RBC
Blood Product Expiration Date: 202108132359
Blood Product Expiration Date: 202108172359
ISSUE DATE / TIME: 202107221921
ISSUE DATE / TIME: 202107222220
Unit Type and Rh: 6200
Unit Type and Rh: 6200

## 2020-03-21 LAB — CBC
HCT: 27.3 % — ABNORMAL LOW (ref 36.0–46.0)
Hemoglobin: 8.7 g/dL — ABNORMAL LOW (ref 12.0–15.0)
MCH: 30.1 pg (ref 26.0–34.0)
MCHC: 31.9 g/dL (ref 30.0–36.0)
MCV: 94.5 fL (ref 80.0–100.0)
Platelets: 248 10*3/uL (ref 150–400)
RBC: 2.89 MIL/uL — ABNORMAL LOW (ref 3.87–5.11)
RDW: 17.8 % — ABNORMAL HIGH (ref 11.5–15.5)
WBC: 7.2 10*3/uL (ref 4.0–10.5)
nRBC: 0 % (ref 0.0–0.2)

## 2020-03-21 MED ORDER — OXYCODONE-ACETAMINOPHEN 5-325 MG PO TABS: 1.0000 | ORAL_TABLET | Freq: Four times a day (QID) | ORAL | 0 refills | Status: DC | PRN

## 2020-03-21 MED ORDER — ANASTROZOLE 1 MG PO TABS: 1.0000 mg | ORAL_TABLET | Freq: Every day | ORAL | 0 refills | Status: DC

## 2020-03-21 MED ORDER — FOLIC ACID 1 MG PO TABS: 1.0000 mg | ORAL_TABLET | Freq: Every day | ORAL | Status: DC

## 2020-03-21 MED ORDER — HEPARIN SOD (PORK) LOCK FLUSH 100 UNIT/ML IV SOLN
500.0000 [IU] | Freq: Once | INTRAVENOUS | Status: DC
  Filled 2020-03-21: qty 5

## 2020-03-21 MED ORDER — POLYETHYLENE GLYCOL 3350 17 G PO PACK: 17.0000 g | PACK | Freq: Two times a day (BID) | ORAL | 1 refills | Status: DC

## 2020-03-21 NOTE — Discharge Summary (Signed)
Physician Discharge Summary  [name] TKP:[id number] DOB: 10/09/1955 DOA: 03/03/2020  PCP: [name], MD  Admit date: 03/03/2020 Discharge date: 03/21/2020  Time spent: 55 minutes  Recommendations for Outpatient Follow-up:  1. Follow-up with Dr. Jana Hakim as scheduled 03/27/2020. 2. Follow-up with radiation oncology as scheduled. 3. Follow-up with Fulp, Cammie, MD in 2 to 3 weeks.  On follow-up patient will need a basic metabolic profile as well as a magnesium level done to follow-up on electrolytes and renal function.  Patient also need a CBC done to follow-up on H&H.   Discharge Diagnoses:  Active Problems:   Essential hypertension   Edema   Malignant neoplasm of upper-outer quadrant of left breast in female, estrogen receptor positive ([place])   Morbid obesity with BMI of 60.0-69.9, adult (HCC)   Acute renal failure (ARF) (HCC)   Gout   Skin breakdown   Skin ulcer of perineum, limited to breakdown of skin (HCC)   Leukocytosis   Anemia associated with chemotherapy   Thrombocytopenia (HCC)   Cellulitis of abdominal wall   Constipation   Folate deficiency   Hypomagnesemia   Debility   Discharge Condition: Stable  Diet recommendation: Heart healthy  Filed Weights   03/03/20 0942  Weight: (!) 145.2 kg    History of present illness:  HPI per Dr. Janyth Contes is a 64 y.o. female with medical history significant of T2N1 stage IIb, grade 3 ER positive breast cancer status post mastectomy in April 2021 with negative margins undergoing adjuvant chemotherapy, Adriamycin plus cyclophosphamide, last dose given last week, morbid obesity (very poorly mobile and requires help to get up off the bed), lymphedema, poor wound healing, venous insufficiency, stage III chronic kidney disease, hypertension who was recently admitted in the middle of June for cellulitis.  She currently lives alone and has family member who comes in to take care of her.  She is incontinent of urine at all  times.  If she has a BM someone comes to help clean her up.  Over the last 4 days she has had increasing pain and asked to get in the shower where the caregiver noted significant skin breakdown.  The caregiver has been using moisture barrier cream and antifungal's in these areas but has not gotten much success.  Her pain is poorly controlled at home.  ED Course: In the ED she was noted to have a lot of skin breakdown maceration, and several vesicles were noted. She was found to have an elevated white count of 17.2 (up from 1.3 on 6/30), a low hemoglobin at 7.9 (down from 8.5 on 6/30), low platelet count of 127 (up from 71 on 6/30).  Her creatinine was 1.61 (up from 1.37 on 6/30).  A Foley catheter was placed urinalysis showed small blood but was otherwise negative.  Given her white count the patient was pan-cultured, lactic acid was performed and was elevated at 2.7.  She was given IV hydration and started on antibiotics.  Hospital Course:  1 multiple skin breakdown and cellulitis in abdominal pannus area/perineum/inner thighs Questionable etiology.  Felt likely secondary to poor mobility secondary to morbid obesity with incontinence of urine in the setting of chemotherapy.  Initial lactic acidosis resolved.  Patient had a leukocytosis that has trended down and improved with antibiotic treatment.  Patient remained afebrile.  Status post full course of antibiotics.  Foley catheter discontinued and patient with a pure wick.  Wound care consulted and local wound care done.  Patient be discharged home with  home health therapies as well as home health RN and recommendations for wound care.  Outpatient follow-up.    2.  Leukocytosis Felt likely secondary to problem #1 and recent Neulasta.  Leukocytosis trended down and had resolved by day of discharge.  Status post full course of antibiotics.  Follow.  3.  Oral thrush Completed a course of Diflucan.    4.  Hypertension Controlled on home regimen of  Coreg and hydralazine.  Outpatient follow-up.   5.  Acute kidney injury/metabolic acidosis Improved and had resolved by day of discharge.  6.  Constipation Patient was maintained on MiraLAX twice daily.  Outpatient follow-up.   7.  Debility/deconditioning/poor mobility/weakness Patient seen by PT/ OT who are recommending SNF placement.  Social work noted however difficulty placing patient in SNF due to insurance authorization.  Patient will be discharged home with home health therapies.  Outpatient follow-up.   8.  Folate deficiency/anemia of chronic disease Hemoglobin on admission was 7.9 which dropped as low as 6.9.  Status post transfusion of 1 unit packed red blood cells.  Hemoglobin at 7.0 the morning of 03/20/2020 with repeat hemoglobin at 6.7.  Patient transfused 2 more units of packed red blood cells with appropriate response with hemoglobin of 8.7 by day of discharge.  Patient also maintained on folic acid daily.  Outpatient follow-up with hematology/oncology.  9.  T2 N1 stage IIb grade 3 ER positive invasive ductal carcinoma status post left modified radical mastectomy 12/19/2019 Being followed by oncology.  Patient started on anastrozole 03/15/2020.  Patient to be set up with radiation oncology to complete local therapy.  Patient underwent simulation on 03/20/2020 for radiation.    Outpatient follow-up with radiation oncology/oncology.   10.  Gout Patient maintained on home regimen colchicine.   11.  Hypomagnesemia Patient was placed on oral magnesium oxide during the hospitalization.  Outpatient follow-up.  12.  Obesity with BMI of 60 Outpatient follow-up for healthy lifestyle/weight loss strategy.  #13 pressure ulcer on buttocks, POA Continue current wound care.  Pressure Injury 12/19/19 Tibial Distal;Left;Posterior Stage 2 -  Partial thickness loss of dermis presenting as a shallow open injury with a red, pink wound bed without slough. Pink, red (Active)  12/19/19  1903  Location: Tibial  Location Orientation: Distal;Left;Posterior  Staging: Stage 2 -  Partial thickness loss of dermis presenting as a shallow open injury with a red, pink wound bed without slough.  Wound Description (Comments): Pink, red  Present on Admission: Yes     Pressure Injury 12/19/19 Sacrum Medial Stage 2 -  Partial thickness loss of dermis presenting as a shallow open injury with a red, pink wound bed without slough. Pink, red (Active)  12/19/19 1904  Location: Sacrum  Location Orientation: Medial  Staging: Stage 2 -  Partial thickness loss of dermis presenting as a shallow open injury with a red, pink wound bed without slough.  Wound Description (Comments): Pink, red  Present on Admission: Yes     Pressure Injury 03/03/20 Buttocks Posterior;Left Stage 3 -  Full thickness tissue loss. Subcutaneous fat may be visible but bone, tendon or muscle are NOT exposed. (Active)  03/03/20 2308  Location: Buttocks  Location Orientation: Posterior;Left  Staging: Stage 3 -  Full thickness tissue loss. Subcutaneous fat may be visible but bone, tendon or muscle are NOT exposed.  Wound Description (Comments):   Present on Admission: Yes     Pressure Injury 03/03/20 Buttocks Right;Posterior Stage 2 -  Partial thickness loss of dermis presenting  as a shallow open injury with a red, pink wound bed without slough. (Active)  03/03/20 2308  Location: Buttocks  Location Orientation: Right;Posterior  Staging: Stage 2 -  Partial thickness loss of dermis presenting as a shallow open injury with a red, pink wound bed without slough.  Wound Description (Comments):   Present on Admission: Yes      Procedures:  CT abdomen and pelvis 03/07/2020  Transfused 2 units packed red blood cells 03/20/2020  Transfuse 1 unit packed red blood cells 03/06/2020  Consultations:  Oncology: Dr. Jana Hakim  Wound care nurse, Melody Liane Comber, RN/Dawn [place], RN  Discharge Exam: Vitals:   03/21/20 0822  03/21/20 1343  BP: 124/68 122/80  Pulse: 84 80  Resp:  16  Temp:  98.9 F (37.2 C)  SpO2:  90%    General: NAD Cardiovascular: RRR Respiratory: CTAB anterior lung fields.  Discharge Instructions   Discharge Instructions    Diet - low sodium heart healthy   Complete by: As directed    Discharge wound care:   Complete by: As directed    As noted above   Increase activity slowly   Complete by: As directed      Allergies as of 03/21/2020      Reactions   Amlodipine Swelling   BLE edema      Medication List    TAKE these medications   acetaminophen 500 MG tablet Commonly known as: TYLENOL Take 500 mg by mouth as needed for mild pain.   anastrozole 1 MG tablet Commonly known as: ARIMIDEX Take 1 tablet (1 mg total) by mouth daily. Start taking on: March 22, 2020   aspirin 81 MG EC tablet Take 1 tablet (81 mg total) by mouth daily. Swallow whole.   Blood Pressure Monitor Devi Use as instructed by provider to check bloos pressure daily. ICD: I10   carvedilol 12.5 MG tablet Commonly known as: COREG Take 1 tablet (12.5 mg total) by mouth 2 (two) times daily with a meal.   colchicine 0.6 MG tablet Take 0.5 tablets (0.3 mg total) by mouth every other day.   folic acid 1 MG tablet Commonly known as: FOLVITE Take 1 tablet (1 mg total) by mouth daily. Start taking on: March 22, 2020   hydrALAZINE 25 MG tablet Commonly known as: APRESOLINE Take 1 tablet (25 mg total) by mouth every 8 (eight) hours.   lidocaine-prilocaine cream Commonly known as: EMLA Apply 1 application topically as needed.   magic mouthwash w/lidocaine Soln Take 5 mLs by mouth 3 (three) times daily as needed for mouth pain.   ondansetron 4 MG tablet Commonly known as: Zofran Take 1 tablet (4 mg total) by mouth every 8 (eight) hours as needed for nausea or vomiting.   oxyCODONE-acetaminophen 5-325 MG tablet Commonly known as: PERCOCET/ROXICET Take 1 tablet by mouth every 6 (six) hours as  needed for severe pain.   polyethylene glycol 17 g packet Commonly known as: MIRALAX / GLYCOLAX Take 17 g by mouth 2 (two) times daily.            Durable Medical Equipment  (From admission, onward)         Start     Ordered   03/19/20 1304  For home use only DME lightweight manual wheelchair with seat cushion  Once       Comments: Patient suffers from weakness which impairs their ability to perform daily activities like walking in the home.  A walker will not resolve  issue with  performing activities of daily living. A wheelchair will allow patient to safely perform daily activities. Patient is not able to propel themselves in the home using a standard weight wheelchair due to weakness. Patient can self propel in the lightweight wheelchair. Length of need lifetime. Accessories: elevating leg rests (ELRs), wheel locks, extensions and anti-tippers. Bariatric   03/19/20 1305   03/19/20 1259  For home use only DME 3 n 1  Once       Comments: Bariatric   03/19/20 1302           Discharge Care Instructions  (From admission, onward)         Start     Ordered   03/21/20 0000  Discharge wound care:       Comments: As noted above   03/21/20 1508         Allergies  Allergen Reactions  . Amlodipine Swelling    BLE edema    Follow-up Information    Fulp, Cammie, MD. Schedule an appointment as soon as possible for a visit in 2 week(s).   Specialty: Family Medicine Why: f/u in 2-3 weeks. Contact information: [place] [address] [phone]        Magrinat, Virgie Dad, MD Follow up on 03/27/2020.   Specialty: Oncology Why: f/u as scheduled at 1pm Contact information: [place] [address] [phone]                The results of significant diagnostics from this hospitalization (including imaging, microbiology, ancillary and laboratory) are listed below for reference.    Significant Diagnostic Studies: CT  ABDOMEN PELVIS W CONTRAST  Result Date: 03/07/2020 CLINICAL DATA:  Generalized abdominal pain. Ongoing diarrhea. History of left breast cancer with current chemotherapy. EXAM: CT ABDOMEN AND PELVIS WITH CONTRAST TECHNIQUE: Multidetector CT imaging of the abdomen and pelvis was performed using the standard protocol following bolus administration of intravenous contrast. CONTRAST:  151mL OMNIPAQUE IOHEXOL 300 MG/ML  SOLN COMPARISON:  CT 07/14/2012. Renal ultrasound 01/22/2020 FINDINGS: Lower chest: No focal airspace disease or pleural effusion. There are coronary artery calcifications. Hepatobiliary: Prominent liver spanning 18 cm cranial caudal. Decreased hepatic steatosis from prior exam. Questionable nodular hepatic contours. Decompressed gallbladder without gallstones or pericholecystic inflammation. Pancreas: No ductal dilatation or inflammation. Spleen: Normal in size without focal abnormality. Splenule anteriorly unchanged from prior. Adrenals/Urinary Tract: No adrenal nodule. There is no hydronephrosis. Symmetric bilateral perinephric edema. There are nonobstructing stones in the right kidney, largest measuring 7 mm. Parenchymal calcification in the lower left kidney. There is symmetric renal excretion on 6 minutes delay scan. Partially distended urinary bladder. Air-fluid level in the bladder, Foley catheter in place. Equivocal bladder wall thickening. Stomach/Bowel: Decompressed stomach. No small bowel obstruction or inflammation. Administered enteric contrast reaches the colon. Normal terminal ileum. No appendicitis, the appendix is not well characterized. Enteric contrast reaches the distal colon. Equivocal mild wall thickening of the distal descending and proximal sigmoid colon but no pericolonic inflammation. This is in the region of multiple diverticula. There is no acute diverticulitis. No abnormal rectal distention. Vascular/Lymphatic: Minor aortic atherosclerosis. Patent portal vein. Few lower  retroperitoneal nodes that are not enlarged by size criteria. Reproductive: Uterus and bilateral adnexa are unremarkable. Other: No free air or ascites. Subcutaneous edema in the bilateral flank soft tissues. Musculoskeletal: Diffuse degenerative change throughout the spine. Mild anterior wedging of superior endplate of L3 and L4 vertebra, age indeterminate but new from 2019. There are no acute  or suspicious osseous abnormalities. IMPRESSION: 1. Equivocal mild wall thickening of the distal descending and proximal sigmoid colon but no pericolonic inflammation. An element of colitis is considered in the setting of diarrhea. This is in the region of multiple diverticula, but no focal diverticulum. 2. Nonobstructing right nephrolithiasis. 3. Questionable nodular hepatic contours, can be seen with cirrhosis. Recommend correlation with cirrhosis risk factors. 4. Mild anterior wedging of superior endplate of L3 and L4 vertebra, age indeterminate but new from 2013. Aortic Atherosclerosis (ICD10-I70.0). Electronically Signed   By: Keith Rake M.D.   On: 03/07/2020 17:21    Microbiology: No results found for this or any previous visit (from the past 240 hour(s)).   Labs: Basic Metabolic Panel: Recent Labs  Lab 03/17/20 0535 03/19/20 0540 03/20/20 0533 03/20/20 1215  NA  --  136 136  --   K  --  5.1 5.2* 4.4  CL  --  102 102  --   CO2  --  27 28  --   GLUCOSE  --  90 89  --   BUN  --  15 15  --   CREATININE 0.96 1.00 1.11*  --   CALCIUM  --  8.5* 8.8*  --    Liver Function Tests: No results for input(s): AST, ALT, ALKPHOS, BILITOT, PROT, ALBUMIN in the last 168 hours. No results for input(s): LIPASE, AMYLASE in the last 168 hours. No results for input(s): AMMONIA in the last 168 hours. CBC: Recent Labs  Lab 03/19/20 0540 03/19/20 1241 03/20/20 0533 03/20/20 1215 03/21/20 0535  WBC 7.8  --  7.1  --  7.2  HGB 7.1* 7.7* 7.0* 6.7* 8.7*  HCT 22.7* 24.5* 22.5* 21.8* 27.3*  MCV 98.7  --   99.1  --  94.5  PLT 266  --  279  --  248   Cardiac Enzymes: No results for input(s): CKTOTAL, CKMB, CKMBINDEX, TROPONINI in the last 168 hours. BNP: BNP (last 3 results) No results for input(s): BNP in the last 8760 hours.  ProBNP (last 3 results) No results for input(s): PROBNP in the last 8760 hours.  CBG: No results for input(s): GLUCAP in the last 168 hours.     Signed:  Irine Seal MD.  [facility] 03/21/2020, 3:10 PM

## 2020-03-21 NOTE — Progress Notes (Signed)
Pt VS WNL.  Port deaccessed by [employer].  Discharge instructions provided to and reviewed with patient.  Pt transported to front lobby via wheelchair by [facility] staff.

## 2020-03-24 ENCOUNTER — Telehealth: Payer: Self-pay | Admitting: *Deleted

## 2020-03-24 ENCOUNTER — Telehealth: Payer: Self-pay

## 2020-03-24 ENCOUNTER — Encounter: Payer: Self-pay | Admitting: *Deleted

## 2020-03-24 DIAGNOSIS — R238 Other skin changes: Secondary | ICD-10-CM

## 2020-03-24 NOTE — Telephone Encounter (Signed)
From the discharge call.  She has an app with Dr [name] tomorrow - 03/25/2020    She said she is worse than when she went into the hospital  She explained that the chemo has not worked well with her body. She wanted to call [facility] today to schedule an appt as soon as possible.  She is also incontinent of urine and no one with her 24/7 to provide assistance.  She is concerned about additional skin breakdown as she is not able to maintain her hygiene. Her sister comes by every morning to check on her, but she needs someone with her all of the time. She has requested a 2 bedroom apartment from the housing authority and needs a unit that is wheelchair accessible.  She said that her sister told her that if she has the extra bedroom , she ( her sister) will come to live with her and stay 24/7. She has paperwork that needs to be completed for this apartment and wants to see a provider as soon as possible  She was in agreement to scheduling an appt with Dr [name] for tomorrow. She said she would arrange cab transportation.  In the meantime, she is trying to hire a live in aide which she understands will be private pay.  She also has paperwork regarding the aide that she wants to share with the provider.    she said that she has all medications. Her son picked up the new ones at the pharmacy for her. No questions about her med regime.     lives alone.  She said that her niece was supposed to be helping her and taking her to appts but now her niece will not answer the phone when she calls her. She has not seen her niece or heard from her since she has been home.    Has wheelchair, and hospital bed.  needs large BSC.   Has been referred to [facility] for home health   Has orders for wound care to multiple open areas and she is not able to do this herself and her niece has not been available to help as planned.    Incontinent of urine and needs purewick.    ADLs: needs assistance.  Has to rely on her sister or  neighbor to help. Needs assistance getting out of bed and bathing/dressing. She said she is able to ambulate a short distance indoors with her RW.       she said that she usually depends on her sister. She has 2 steps into her apartment and needs assistance.  She said that she will arrange for a cab to transport her to her appointment tomorrow. Her sister will help her get out of the apartment and her neighbor will help her back in.

## 2020-03-24 NOTE — Patient Outreach (Signed)
  Medicaid Managed Care   Referral and Resource Coordination  03/24/2020 Name: Layza Summa MRN: [id number] DOB: 06/15/1956  Gayna Braddy is a 64 y.o. year old female primary care patient of Fulp, Cammie, MD referred to the [place] team for assistance with clinical care needs and coordination related to social determinants of health.   Review of patient status, including review of consultants reports, other relevant assessments, and collaboration with appropriate care team members and the patient's provider was performed as part of comprehensive patient evaluation and provision of chronic care management services.    SDOH (Social Determinants of Health) assessments performed: Yes SDOH Interventions     Most Recent Value  SDOH Interventions  SDOH Interventions for the Following Domains Transportation  Transportation Interventions Other (Comment)  [CARe Guide referral placed]        Follow Up Plan: Care Guide will follow up with patient regarding transportation needs and [facility] Management team will address referral for clinical needs related to skin integrity.    [place] Management Coordinator Direct Dial:  [phone]  Fax: [phone]

## 2020-03-24 NOTE — Telephone Encounter (Signed)
Transition Care Management Follow-up Telephone Call  Date of discharge and from where: 03/21/2020, [facility]   How have you been since you were released from the hospital? She said she is worse than when she went into the hospital  She explained that the chemo has not worked well with her body. She wanted to call [facility] today to schedule an appt as soon as possible.  She is also incontinent of urine and no one with her 24/7 to provide assistance.  She is concerned about additional skin breakdown as she is not able to maintain her hygiene. Her sister comes by every morning to check on her, but she needs someone with her all of the time. She has requested a 2 bedroom apartment from the housing authority and needs a unit that is wheelchair accessible.  She said that her sister told her that if she has the extra bedroom , she ( her sister) will come to live with her and stay 24/7. She has paperwork that needs to be completed for this apartment and wants to see a provider as soon as possible  She was in agreement to scheduling an appt with Dr [name] for tomorrow. She said she would arrange cab transportation.  In the meantime, she is trying to hire a live in aide which she understands will be private pay.  She also has paperwork regarding the aide that she wants to share with the provider.    Any questions or concerns? noted above.  Items Reviewed:  Did the pt receive and understand the discharge instructions provided?  yes, no questions at this time   Medications obtained and verified?  she said that she has all medications. Her son picked up the new ones at the pharmacy for her. No questions about her med regime.   Any new allergies since your discharge?  none reported   Do you have support at home?  lives alone.  She said that her niece was supposed to be helping her and taking her to appts but now her niece will not answer the phone when she calls her. She has not seen her niece or heard from  her since she has been home.   Has wheelchair, and hospital bed.  needs large BSC.  Has been referred to [facility] for home health  Has orders for wound care to multiple open areas and she is not able to do this herself and her niece has not been available to help as planned.   Does not have a home BP monitor.  Incontinent of urine and needs purewick.   Functional Questionnaire: (I = Independent and D = Dependent) ADLs: needs assistance.  Has to rely on her sister or neighbor to help. Needs assistance getting out of bed and bathing/dressing. She said she is able to ambulate a short distance indoors with her RW.     Follow up appointments reviewed:   PCP Hospital f/u appt confirmed?  Dr [name]  - tomorrow 03/25/2020  [facility] f/u appt confirmed?multiple appointments with oncology have been scheduled  Are transportation arrangements needed? she said that she usually depends on her sister. She has 2 steps into her apartment and needs assistance.  She said that she will arrange for a cab to transport her to her appointment tomorrow. Her sister will help her get out of the apartment and her neighbor will help her back in.   If their condition worsens, is the pt aware to call PCP or go to  the Emergency Dept.?  yes  Was the patient provided with contact information for the PCP's office or ED? } she has the phone number for the clinic  Was to pt encouraged to call back with questions or concerns?  yes

## 2020-03-24 NOTE — Telephone Encounter (Signed)
Contacted pt to complete transitions of care assessment:   Transition Care Management Follow-up Telephone Call  . Medicaid Managed Care Transition Call Status:MM [facility] Call Made  . Date of discharge and from where: [facility], 03/21/20  . How have you been since you were released from the hospital? worse  . Any questions or concerns? Pt has not gotten pure wick. She is having to lay in urine because she needs assistance. Her sister has been helping her. She says home health has not been out to see her. She is also unable to keep the dressings on her legs dry. Items Reviewed: Marland Kitchen Did the pt receive and understand the discharge instructions provided? No  . Medications obtained and verified? Yes  . Any new allergies since your discharge? No  . Dietary orders reviewed? Yes . Do you have support at home? Home Health  Functional Questionnaire: (I = Independent and D = Dependent)  ADLs: Dependent Bathing/Dressing:Dependent Meal Prep: Dependent Eating: Independent Maintaining continence: Dependent Transferring/Ambulation: Dependent Managing Meds: Independent Follow up appointments reviewed:  PCP Hospital f/u appt confirmed? No  pt will call Dr Chapman Fitch on 03/24/20 to schedule follow up appt  Specialist Hospital f/u appt confirmed? Scheduled to see Dr Mariah Milling on7/29/21@ 1300  Are transportation arrangements needed? Yes    If their condition worsens, is the pt aware to call PCP or go to the EmergencyDept.? Yes  Was the patient provided with contact information for the PCP's office or ED? yes  Was to pt encouraged to call back with questions or concerns? yes  Orders placed for [place] for equipment, and transportation.  Lenor Coffin, RN, BSN, [place] [facility] [place] [phone]

## 2020-03-25 ENCOUNTER — Telehealth: Payer: Self-pay | Admitting: Family Medicine

## 2020-03-25 ENCOUNTER — Encounter: Payer: Self-pay | Admitting: Family Medicine

## 2020-03-25 ENCOUNTER — Ambulatory Visit: Payer: Medicaid Other | Attending: Family Medicine | Admitting: Family Medicine

## 2020-03-25 ENCOUNTER — Telehealth: Payer: Self-pay

## 2020-03-25 ENCOUNTER — Other Ambulatory Visit: Payer: Self-pay

## 2020-03-25 VITALS — BP 124/67 | HR 88 | Wt 300.0 lb

## 2020-03-25 DIAGNOSIS — R7303 Prediabetes: Secondary | ICD-10-CM | POA: Diagnosis not present

## 2020-03-25 DIAGNOSIS — N39498 Other specified urinary incontinence: Secondary | ICD-10-CM

## 2020-03-25 DIAGNOSIS — I872 Venous insufficiency (chronic) (peripheral): Secondary | ICD-10-CM

## 2020-03-25 DIAGNOSIS — T451X5A Adverse effect of antineoplastic and immunosuppressive drugs, initial encounter: Secondary | ICD-10-CM

## 2020-03-25 DIAGNOSIS — C50412 Malignant neoplasm of upper-outer quadrant of left female breast: Secondary | ICD-10-CM

## 2020-03-25 DIAGNOSIS — I10 Essential (primary) hypertension: Secondary | ICD-10-CM | POA: Diagnosis not present

## 2020-03-25 DIAGNOSIS — D6481 Anemia due to antineoplastic chemotherapy: Secondary | ICD-10-CM | POA: Diagnosis not present

## 2020-03-25 DIAGNOSIS — Z6841 Body Mass Index (BMI) 40.0 and over, adult: Secondary | ICD-10-CM

## 2020-03-25 DIAGNOSIS — L97902 Non-pressure chronic ulcer of unspecified part of unspecified lower leg with fat layer exposed: Secondary | ICD-10-CM

## 2020-03-25 DIAGNOSIS — Z170 Estrogen receptor positive status [ER+]: Secondary | ICD-10-CM

## 2020-03-25 DIAGNOSIS — Z510 Encounter for antineoplastic radiation therapy: Secondary | ICD-10-CM | POA: Diagnosis present

## 2020-03-25 MED ORDER — CARVEDILOL 12.5 MG PO TABS: 12.5000 mg | ORAL_TABLET | Freq: Two times a day (BID) | ORAL | 3 refills | Status: DC

## 2020-03-25 MED ORDER — FUROSEMIDE 20 MG PO TABS
20.0000 mg | ORAL_TABLET | Freq: Every day | ORAL | 0 refills | Status: DC
Start: 2020-03-25 — End: 2020-04-18

## 2020-03-25 MED ORDER — HYDRALAZINE HCL 25 MG PO TABS: 25.0000 mg | ORAL_TABLET | Freq: Three times a day (TID) | ORAL | 3 refills | Status: DC

## 2020-03-25 NOTE — Progress Notes (Signed)
Subjective:  Patient ID: [name], female    DOB: 1956/01/29  Age: 64 y.o. MRN: [id number]  CC: Hospitalization Follow-up   HPI [name] is a 64 year old female with a history of morbid obesity, left breast T2N1 stage IIb, grade 3 ER PR positive, HER -2 not amplified breast cancer (status post modified left radical mastectomy and axillary lymph node dissection currently on chemotherapy), hypertension who presents today at the transitional care clinic for follow-up after hospitalization from 03/03/2020 through 03/21/2020 for cellulitis in lower extremities and abdominal area. This was thought to be secondary to poor mobility and urinary incontinence.  She was treated with antibiotics, evaluated by wound care with plans to discharge her to skilled nursing facility however insurance precluded this.  She was discharged home with home health aide.  This morning she thought she had bleeding from the sores. She lives alone and has no one to help her. Her dressing has not been changed since discharge. She needs a form for reasonable accomodation completed to allow someone stay with her and assist. She states once started chemo "everything went crazy" and she developed sores and she is not going to take any chemotherapy. Her medical oncologist is Dr. Jana Hakim. She starts radiation tomorrow.  She has been scheduled with better home health and is expecting a visit soon. She has urinary incontinence and is requesting purewick Past Medical History:  Diagnosis Date   Arthritis    Cancer ([place]) 11/2019   Left breast   Diverticulitis    Family history of ovarian cancer    Heart murmur    11/26/19 echo: Mild AS. AV mean gradient 12.0 mmHg; however, LVOT gradient 8 mmHg with intracvitary gradient-significant AS is not suspected   Hypertension     Past Surgical History:  Procedure Laterality Date   MASTECTOMY WITH AXILLARY LYMPH NODE DISSECTION Left 12/19/2019   Procedure: LEFT MASTECTOMY  WITH AXILLARY LYMPH NODE DISSECTION;  Surgeon: [name], MD;  Location: [facility];  Service: General;  Laterality: Left;   MULTIPLE EXTRACTIONS WITH ALVEOLOPLASTY Bilateral 12/14/2019   Procedure: MULTIPLE EXTRACTION WITH ALVEOLOPLASTY;  Surgeon: [name], DDS;  Location: [facility];  Service: Oral Surgery;  Laterality: Bilateral;   PORTACATH PLACEMENT N/A 12/19/2019   Procedure: INSERTION PORT-A-CATH WITH ULTRASOUND GUIDANCE;  Surgeon: [name], MD;  Location: [facility];  Service: General;  Laterality: N/A;   RADIAL HEAD ARTHROPLASTY Right 06/25/2019   Procedure: RIGHT RADIAL HEAD ARTHROPLASTY;  Surgeon: [name], MD;  Location: [facility];  Service: Orthopedics;  Laterality: Right;   TUBAL LIGATION      Family History  Problem Relation Age of Onset   Hypertension Mother    Dementia Mother    Ovarian cancer Half-Sister 66   Diabetes Half-Sister    Stroke Half-Sister     Allergies  Allergen Reactions   Amlodipine Swelling    BLE edema    Outpatient Medications Prior to Visit  Medication Sig Dispense Refill   aspirin EC 81 MG EC tablet Take 1 tablet (81 mg total) by mouth daily. Swallow whole. 30 tablet 11   Blood Pressure Monitor DEVI Use as instructed by provider to check bloos pressure daily. ICD: I10 1 each 0   colchicine 0.6 MG tablet Take 0.5 tablets (0.3 mg total) by mouth every other day. 20 tablet 0   folic acid (FOLVITE) 1 MG tablet Take 1 tablet (1 mg total) by mouth daily.     lidocaine-prilocaine (EMLA) cream Apply 1 application topically as needed.  30 g 0   ondansetron (ZOFRAN) 4 MG tablet Take 1 tablet (4 mg total) by mouth every 8 (eight) hours as needed for nausea or vomiting. 20 tablet 0   oxyCODONE-acetaminophen (PERCOCET/ROXICET) 5-325 MG tablet Take 1 tablet by mouth every 6 (six) hours as needed for severe pain. 15 tablet 0   polyethylene glycol (MIRALAX / GLYCOLAX) 17 g packet Take 17 g by mouth 2 (two) times daily. 28 each 1    carvedilol (COREG) 12.5 MG tablet Take 1 tablet (12.5 mg total) by mouth 2 (two) times daily with a meal. 60 tablet 3   hydrALAZINE (APRESOLINE) 25 MG tablet Take 1 tablet (25 mg total) by mouth every 8 (eight) hours. 90 tablet 0   acetaminophen (TYLENOL) 500 MG tablet Take 500 mg by mouth as needed for mild pain.     anastrozole (ARIMIDEX) 1 MG tablet Take 1 tablet (1 mg total) by mouth daily. (Patient not taking: Reported on 03/25/2020) 30 tablet 0   magic mouthwash w/lidocaine SOLN Take 5 mLs by mouth 3 (three) times daily as needed for mouth pain. (Patient not taking: Reported on 03/25/2020)     No facility-administered medications prior to visit.     ROS Review of Systems  Constitutional: Negative for activity change, appetite change and fatigue.  HENT: Negative for congestion, sinus pressure and sore throat.   Eyes: Negative for visual disturbance.  Respiratory: Negative for cough, chest tightness, shortness of breath and wheezing.   Cardiovascular: Positive for leg swelling. Negative for chest pain and palpitations.  Gastrointestinal: Negative for abdominal distention, abdominal pain and constipation.  Endocrine: Negative for polydipsia.  Genitourinary: Negative for dysuria and frequency.  Musculoskeletal: Negative for arthralgias and back pain.  Skin: Negative for rash.  Neurological: Negative for tremors, light-headedness and numbness.  Hematological: Does not bruise/bleed easily.  Psychiatric/Behavioral: Negative for agitation and behavioral problems.    Objective:  BP 124/67    Pulse 88    Wt (!) 300 lb (136.1 kg)    SpO2 97%    BMI 56.68 kg/m   BP/Weight 03/25/2020 7/00/1749 12/01/9673  Systolic BP 916 384 -  Diastolic BP 67 80 -  Wt. (Lbs) 300 - 320  BMI 56.68 - 60.46  Some encounter information is confidential and restricted. Go to Review Flowsheets activity to see all data.      Physical Exam Constitutional:      Appearance: She is well-developed.      Comments: Morbidly obese  Neck:     Vascular: No JVD.  Cardiovascular:     Rate and Rhythm: Normal rate.     Heart sounds: Normal heart sounds. No murmur heard.   Pulmonary:     Effort: Pulmonary effort is normal.     Breath sounds: Normal breath sounds. No wheezing or rales.  Chest:     Chest wall: No tenderness.  Abdominal:     General: Abdomen is protuberant. Bowel sounds are normal. There is no distension.     Palpations: Abdomen is soft. There is no mass.     Tenderness: There is no abdominal tenderness.     Comments: Pitting edema in lower abdominal wall  Musculoskeletal:     Comments: Bilateral lower extremity lymphedema with areas of open sores bilaterally  Skin:    Comments: Poor anal and perineal hygiene  Neurological:     Mental Status: She is alert and oriented to person, place, and time.  Psychiatric:        Mood and  Affect: Mood normal.     CMP Latest Ref Rng & Units 03/20/2020 03/20/2020 03/19/2020  Glucose 70 - 99 mg/dL - 89 90  BUN 8 - 23 mg/dL - 15 15  Creatinine 0.44 - 1.00 mg/dL - 1.11(H) 1.00  Sodium 135 - 145 mmol/L - 136 136  Potassium 3.5 - 5.1 mmol/L 4.4 5.2(H) 5.1  Chloride 98 - 111 mmol/L - 102 102  CO2 22 - 32 mmol/L - 28 27  Calcium 8.9 - 10.3 mg/dL - 8.8(L) 8.5(L)  Total Protein 6.5 - 8.1 g/dL - - -  Total Bilirubin 0.3 - 1.2 mg/dL - - -  Alkaline Phos 38 - 126 U/L - - -  AST 15 - 41 U/L - - -  ALT 0 - 44 U/L - - -    Lipid Panel     Component Value Date/Time   CHOL 216 (H) 02/10/2020 1536   TRIG 102 02/10/2020 1536   HDL 100 02/10/2020 1536   CHOLHDL 2.2 02/10/2020 1536   VLDL 20 02/10/2020 1536   LDLCALC 96 02/10/2020 1536    CBC    Component Value Date/Time   WBC 7.2 03/21/2020 0535   RBC 2.89 (L) 03/21/2020 0535   HGB 8.7 (L) 03/21/2020 0535   HGB 9.6 (L) 01/02/2020 1710   HCT 27.3 (L) 03/21/2020 0535   HCT 29.2 (L) 01/02/2020 1710   PLT 248 03/21/2020 0535   PLT 268 01/02/2020 1710   MCV 94.5 03/21/2020 0535   MCV 97  01/02/2020 1710   MCH 30.1 03/21/2020 0535   MCHC 31.9 03/21/2020 0535   RDW 17.8 (H) 03/21/2020 0535   RDW 12.4 01/02/2020 1710   LYMPHSABS 1.4 03/05/2020 0500   LYMPHSABS 2.5 01/02/2020 1710   MONOABS 0.9 03/05/2020 0500   EOSABS 0.0 03/05/2020 0500   EOSABS 0.2 01/02/2020 1710   BASOSABS 0.0 03/05/2020 0500   BASOSABS 0.0 01/02/2020 1710    Lab Results  Component Value Date   HGBA1C 6.1 (H) 03/03/2020    Assessment & Plan:  1. Ulcer of lower extremity with fat layer exposed, unspecified laterality ([place]) Underlying lymphedema Dressing changes performed in the clinic She is scheduled to have [place] home care however this seems above the scope of home care and so we will refer her to the wound care center in addition - CBC with Differential/Platelet - AMB referral to wound care center  2. Malignant neoplasm of upper-outer quadrant of left breast in female, estrogen receptor positive ([place]) Status post left radical mastectomy and axillary lymph node dissection Currently on chemotherapy We will be starting radiation tomorrow Follow-up with medical oncology and radiation oncology  3. Morbid obesity with BMI of 60.0-69.9, adult ([place]) Predisposing her to lymphedema with associated poor hygiene She will need PCS services to help her with ADLs and self-care I have completed housing form for accommodation for her  4. Venous insufficiency of both lower extremities Placed on short course of Lasix  5. Anemia associated with chemotherapy Status post 2 units of PRBC during hospitalization Discharge hemoglobin was 8.6 We will order CBC again  6. Prediabetes Prediabetic with A1c of 6.1 Diet controlled  7.  Hypertension Controlled Refilled carvedilol, hydralazine Counseled on blood pressure goal of less than 130/80, low-sodium, DASH diet, medication compliance, 150 minutes of moderate intensity exercise per week. Discussed medication compliance, adverse effects.  8.  Urinary  incontinence We will have case manager check with insurance company and he may be getting Purewick which she has requested for  Meds ordered this encounter  Medications   carvedilol (COREG) 12.5 MG tablet    Sig: Take 1 tablet (12.5 mg total) by mouth 2 (two) times daily with a meal.    Dispense:  60 tablet    Refill:  3   hydrALAZINE (APRESOLINE) 25 MG tablet    Sig: Take 1 tablet (25 mg total) by mouth every 8 (eight) hours.    Dispense:  90 tablet    Refill:  3   furosemide (LASIX) 20 MG tablet    Sig: Take 1 tablet (20 mg total) by mouth daily.    Dispense:  30 tablet    Refill:  0    Follow-up: No follow-ups on file.       Charlott Rakes, MD, FAAFP. [facility] [place], [place]   03/25/2020, 10:31 AM

## 2020-03-25 NOTE — Telephone Encounter (Signed)
03/25/20 Emailed request to [employer] for 03/27/20 appointment. Spoke with patient to let her know [facility] would be contacting her with a pick up time.  Also gave patient number to call for Medicaid [employer] ([place]) [phone].  She will call this number and they will set-up her Medicaid Transportation for future appointments. Ambrose Mantle [phone]

## 2020-03-25 NOTE — Progress Notes (Signed)
HFU for skin breakdown.

## 2020-03-25 NOTE — Patient Instructions (Signed)

## 2020-03-25 NOTE — Telephone Encounter (Signed)
   [name] DOB: 24-Dec-1955 MRN: [id number]   RIDER WAIVER AND RELEASE OF LIABILITY  For purposes of improving physical access to our facilities, [facility] is pleased to partner with third parties to provide [facility] patients or other authorized individuals the option of convenient, on-demand ground transportation services (the [employer]") through use of the technology service that enables users to request on-demand ground transportation from independent third-party providers.  By opting to use and accept these [employer], I, the undersigned, hereby agree on behalf of myself, and on behalf of any minor child using the [employer] for whom I am the parent or legal guardian, as follows:  1. Government social research officer provided to me are provided by independent third-party transportation providers who are not [employer] or employees and who are unaffiliated with [employer]. 2. [facility] is neither a transportation carrier nor a common or public carrier. 3. [facility] has no control over the quality or safety of the transportation that occurs as a result of the [employer]. 4. [facility] cannot guarantee that any third-party transportation provider will complete any arranged transportation service. 5. [facility] makes no representation, warranty, or guarantee regarding the reliability, timeliness, quality, safety, suitability, or availability of any of the Transport Services or that they will be error free. 6. I fully understand that traveling by vehicle involves risks and dangers of serious bodily injury, including permanent disability, paralysis, and death. I agree, on behalf of myself and on behalf of any minor child using the Transport Services for whom I am the parent or legal guardian, that the entire risk arising out of my use of the [employer] remains solely with me, to the maximum extent permitted under applicable law. 7. The [employer] are provided "as is" and "as available." [facility] disclaims all representations and warranties, express, implied or statutory, not expressly set out in these terms, including the implied warranties of merchantability and fitness for a particular purpose. 8. I hereby waive and release [facility], its agents, employees, officers, directors, representatives, insurers, attorneys, assigns, successors, subsidiaries, and affiliates from any and all past, present, or future claims, demands, liabilities, actions, causes of action, or suits of any kind directly or indirectly arising from acceptance and use of the [employer]. 9. I further waive and release [place] and its affiliates from all present and future liability and responsibility for any injury or death to persons or damages to property caused by or related to the use of the [employer]. 10. I have read this Waiver and Release of Liability, and I understand the terms used in it and their legal significance. This Waiver is freely and voluntarily given with the understanding that my right (as well as the right of any minor child for whom I am the parent or legal guardian using the [employer]) to legal recourse against  in connection with the [employer] is knowingly surrendered in return for use of these services.   I attest that I read the consent document to [name], gave [name] the opportunity to ask questions and answered the questions asked (if any). I affirm that [name] then provided consent for she's participation in this program.     Legrand Pitts

## 2020-03-26 ENCOUNTER — Ambulatory Visit
Admission: RE | Admit: 2020-03-26 | Discharge: 2020-03-26 | Disposition: A | Discharge status: Home / Self Care | Payer: Medicaid Other | Source: Ambulatory Visit | Attending: Radiation Oncology | Admitting: Radiation Oncology

## 2020-03-26 ENCOUNTER — Other Ambulatory Visit: Payer: Self-pay

## 2020-03-26 ENCOUNTER — Telehealth: Payer: Self-pay

## 2020-03-26 ENCOUNTER — Telehealth: Payer: Self-pay | Admitting: Family Medicine

## 2020-03-26 DIAGNOSIS — Z510 Encounter for antineoplastic radiation therapy: Secondary | ICD-10-CM | POA: Diagnosis not present

## 2020-03-26 LAB — CBC WITH DIFFERENTIAL/PLATELET
Basophils Absolute: 0 10*3/uL (ref 0.0–0.2)
Basos: 0 %
EOS (ABSOLUTE): 0.4 10*3/uL (ref 0.0–0.4)
Eos: 5 %
Hematocrit: 31.5 % — ABNORMAL LOW (ref 34.0–46.6)
Hemoglobin: 10.7 g/dL — ABNORMAL LOW (ref 11.1–15.9)
Immature Grans (Abs): 0.1 10*3/uL (ref 0.0–0.1)
Immature Granulocytes: 1 %
Lymphocytes Absolute: 1.5 10*3/uL (ref 0.7–3.1)
Lymphs: 16 %
MCH: 31 pg (ref 26.6–33.0)
MCHC: 34 g/dL (ref 31.5–35.7)
MCV: 91 fL (ref 79–97)
Monocytes Absolute: 1 10*3/uL — ABNORMAL HIGH (ref 0.1–0.9)
Monocytes: 11 %
Neutrophils Absolute: 6.5 10*3/uL (ref 1.4–7.0)
Neutrophils: 67 %
Platelets: 352 10*3/uL (ref 150–450)
RBC: 3.45 x10E6/uL — ABNORMAL LOW (ref 3.77–5.28)
RDW: 15.3 % (ref 11.7–15.4)
WBC: 9.5 10*3/uL (ref 3.4–10.8)

## 2020-03-26 NOTE — Telephone Encounter (Signed)
Call placed to [employer] member services # [phone] regarding ordering PCS.  Spoke to [place] and explained that prior to managed medicaid in [place], PCS was ordered through [employer]. Now that the patient has managed medicaid, the services need to be ordered through her insurance company.  This CM was inquiring about the process for ordering these services with [facility].  Jovanna explained that the patient needs to be assigned to a care manager who will then coordinate the services.  She instructed this CM to call # [phone] and speak to the care manager.   Call placed to the [facility] [employer] Department, message left requesting a call back to this CM # [phone].

## 2020-03-26 NOTE — Telephone Encounter (Signed)
Call received from [facility] [employer].  She explained that the [place] for [facility] for [facility] can be completed and faxed to [facility] # [phone].  They will process the requests as [employer] has done.  She will send this CM a [employer] 3051 form to use for future PCS requests.   PCS application faxed to [facility] as requested

## 2020-03-26 NOTE — Telephone Encounter (Signed)
Please advise.  Copied from [place] [phone]. Topic: General - Other >> Mar 26, 2020 10:22 AM Rainey Pines A wrote: Doneen Poisson from Wound care is requesting a callback in regards to how prior authorization for cpt codes is being handles by the office. Doneen Poisson can be reached at [phone]

## 2020-03-27 ENCOUNTER — Ambulatory Visit: Admission: RE | Admit: 2020-03-27 | Payer: Medicaid Other | Source: Ambulatory Visit

## 2020-03-27 ENCOUNTER — Ambulatory Visit
Admission: RE | Admit: 2020-03-27 | Discharge: 2020-03-27 | Disposition: A | Discharge status: Home / Self Care | Payer: Medicaid Other | Source: Ambulatory Visit | Attending: Radiation Oncology | Admitting: Radiation Oncology

## 2020-03-27 ENCOUNTER — Telehealth: Payer: Self-pay | Admitting: *Deleted

## 2020-03-27 ENCOUNTER — Telehealth: Payer: Self-pay

## 2020-03-27 ENCOUNTER — Inpatient Hospital Stay (HOSPITAL_BASED_OUTPATIENT_CLINIC_OR_DEPARTMENT_OTHER): Payer: Medicaid Other | Admitting: Oncology

## 2020-03-27 ENCOUNTER — Other Ambulatory Visit: Payer: Self-pay | Admitting: *Deleted

## 2020-03-27 DIAGNOSIS — I10 Essential (primary) hypertension: Secondary | ICD-10-CM | POA: Diagnosis not present

## 2020-03-27 DIAGNOSIS — Z823 Family history of stroke: Secondary | ICD-10-CM | POA: Diagnosis not present

## 2020-03-27 DIAGNOSIS — Z170 Estrogen receptor positive status [ER+]: Secondary | ICD-10-CM

## 2020-03-27 DIAGNOSIS — R1084 Generalized abdominal pain: Secondary | ICD-10-CM | POA: Diagnosis not present

## 2020-03-27 DIAGNOSIS — Z8042 Family history of malignant neoplasm of prostate: Secondary | ICD-10-CM | POA: Diagnosis not present

## 2020-03-27 DIAGNOSIS — Z833 Family history of diabetes mellitus: Secondary | ICD-10-CM | POA: Diagnosis not present

## 2020-03-27 DIAGNOSIS — C50412 Malignant neoplasm of upper-outer quadrant of left female breast: Secondary | ICD-10-CM

## 2020-03-27 DIAGNOSIS — C50812 Malignant neoplasm of overlapping sites of left female breast: Secondary | ICD-10-CM | POA: Diagnosis present

## 2020-03-27 DIAGNOSIS — I700 Atherosclerosis of aorta: Secondary | ICD-10-CM | POA: Diagnosis not present

## 2020-03-27 DIAGNOSIS — Z79811 Long term (current) use of aromatase inhibitors: Secondary | ICD-10-CM | POA: Diagnosis not present

## 2020-03-27 DIAGNOSIS — Z818 Family history of other mental and behavioral disorders: Secondary | ICD-10-CM | POA: Diagnosis not present

## 2020-03-27 DIAGNOSIS — N200 Calculus of kidney: Secondary | ICD-10-CM | POA: Diagnosis not present

## 2020-03-27 DIAGNOSIS — Z6841 Body Mass Index (BMI) 40.0 and over, adult: Secondary | ICD-10-CM | POA: Diagnosis not present

## 2020-03-27 DIAGNOSIS — Z8041 Family history of malignant neoplasm of ovary: Secondary | ICD-10-CM | POA: Diagnosis not present

## 2020-03-27 DIAGNOSIS — K760 Fatty (change of) liver, not elsewhere classified: Secondary | ICD-10-CM | POA: Diagnosis not present

## 2020-03-27 DIAGNOSIS — R197 Diarrhea, unspecified: Secondary | ICD-10-CM | POA: Diagnosis not present

## 2020-03-27 DIAGNOSIS — Z8249 Family history of ischemic heart disease and other diseases of the circulatory system: Secondary | ICD-10-CM | POA: Diagnosis not present

## 2020-03-27 DIAGNOSIS — Z888 Allergy status to other drugs, medicaments and biological substances status: Secondary | ICD-10-CM | POA: Diagnosis not present

## 2020-03-27 DIAGNOSIS — Z510 Encounter for antineoplastic radiation therapy: Secondary | ICD-10-CM | POA: Diagnosis not present

## 2020-03-27 DIAGNOSIS — Z79899 Other long term (current) drug therapy: Secondary | ICD-10-CM | POA: Diagnosis not present

## 2020-03-27 MED ORDER — SONAFINE EX EMUL
1.0000 "application " | Freq: Two times a day (BID) | CUTANEOUS | Status: DC
  Administered 2020-03-27: 1 via TOPICAL

## 2020-03-27 MED ORDER — ANASTROZOLE 1 MG PO TABS: 1.0000 mg | ORAL_TABLET | Freq: Every day | ORAL | 4 refills | Status: DC

## 2020-03-27 MED ORDER — ANASTROZOLE 1 MG PO TABS: 1.0000 mg | ORAL_TABLET | Freq: Every day | ORAL | 0 refills | Status: DC

## 2020-03-27 MED ORDER — ALRA NON-METALLIC DEODORANT (RAD-ONC)
1.0000 "application " | Freq: Once | TOPICAL | Status: AC
  Administered 2020-03-27: 1 via TOPICAL

## 2020-03-27 NOTE — Progress Notes (Signed)
Pt here for patient teaching.  Pt given Radiation and You booklet, skin care instructions, [place] deodorant and Sonafine.  Reviewed areas of pertinence such as fatigue, hair loss, skin changes, breast tenderness and breast swelling . Pt able to give teach back of to pat skin, use unscented/gentle soap and use baby wipes,apply Sonafine bid and avoid applying anything to skin within 4 hours of treatment. Pt demonstrated understanding of information given and will contact nursing with any questions or concerns.     [email or web address]

## 2020-03-27 NOTE — Progress Notes (Signed)
[place]  Telephone:(336) [phone] Fax:([phone]) [phone]     ID: [name] DOB: 1956/05/20  MR#: [id number]  OQH#:[id number]  Patient Care Team: [name], MD as PCP - General (Family Medicine) Mauro Kaufmann, RN as Oncology Nurse Navigator Rockwell Germany, RN as Oncology Nurse Navigator [name], MD as Consulting Physician (General Surgery) Yani Lal, Virgie Dad, MD as Consulting Physician (Oncology) Kyung Rudd, MD as Consulting Physician (Radiation Oncology) Edrick Kins, DPM as Consulting Physician (Podiatry) Serena Colonel, RN as [place] Management Chauncey Cruel, MD OTHER MD:   CHIEF COMPLAINT: estrogen receptor positive breast cancer  CURRENT TREATMENT: anastrozole; radiation therapy   INTERVAL HISTORY: [name] today for follow up of her estrogen receptor positive breast cancer.  She is now back home.  Her sister is staying with her.  She was admitted to the hospital on 03/03/2020 due to significant skin breakdown between her thighs, due to urinary incontinence. In light of this, her chemotherapy was interrupted and she was started on anastrozole.  She was also referred back to radiation oncology to complete local treatment. She started treatment yesterday, 03/26/2020, and is scheduled to finish on 05/12/2020.   REVIEW OF SYSTEMS: [name] is tolerating anastrozole well.  However the drug was not continued when she was discharged.  She tells me that she was told she would have physical therapy at home but also was not started.  She says that she is now continent of urine that she sleeps well aligned and does not wet the bed and that the ulcers on her legs are drying up.  She has obtained some pads to keep them dry.  She has been referred to the wound center but there is no availability until the end of August.  She is tolerating radiation well so far.   HISTORY OF CURRENT ILLNESS: From the original intake  note:  [name] (pronounced "yellow") had routine screening mammography on 10/24/2019 showing a possible abnormality in the left breast. She underwent left breast ultrasonography at [facility] on 11/05/2019 showing: suspicious 3.8 cm mass in left breast at 3 o'clock; five abnormal lymph nodes.  Accordingly on 11/12/2019 she proceeded to biopsy of the left breast area in question as well as a lymph node. The pathology from this procedure ([id number]) showed: invasive ductal carcinoma with prominent inflammatory response, grade 3. Prognostic indicators significant for: estrogen receptor, 100% positive and progesterone receptor, 50% positive, both with strong staining intensity. Proliferation marker Ki67 at 70%. HER2 negative by immunohistochemistry (0).  The biopsied lymph node confirmed metastatic breast carcinoma.  The patient's subsequent history is as detailed below.   PAST MEDICAL HISTORY: Past Medical History:  Diagnosis Date  . Arthritis   . Cancer ([place]) 11/2019   Left breast  . Diverticulitis   . Family history of ovarian cancer   . Heart murmur    11/26/19 echo: Mild AS. AV mean gradient 12.0 mmHg; however, LVOT gradient 8 mmHg with intracvitary gradient-significant AS is not suspected  . Hypertension     PAST SURGICAL HISTORY: Past Surgical History:  Procedure Laterality Date  . MASTECTOMY WITH AXILLARY LYMPH NODE DISSECTION Left 12/19/2019   Procedure: LEFT MASTECTOMY WITH AXILLARY LYMPH NODE DISSECTION;  Surgeon: [name], MD;  Location: [facility];  Service: General;  Laterality: Left;  Marland Kitchen MULTIPLE EXTRACTIONS WITH ALVEOLOPLASTY Bilateral 12/14/2019   Procedure: MULTIPLE EXTRACTION WITH ALVEOLOPLASTY;  Surgeon: [name], DDS;  Location: [facility];  Service: Oral Surgery;  Laterality: Bilateral;  .  PORTACATH PLACEMENT N/A 12/19/2019   Procedure: INSERTION PORT-A-CATH WITH ULTRASOUND GUIDANCE;  Surgeon: [name], MD;  Location: [facility];  Service: General;   Laterality: N/A;  . RADIAL HEAD ARTHROPLASTY Right 06/25/2019   Procedure: RIGHT RADIAL HEAD ARTHROPLASTY;  Surgeon: [name], MD;  Location: [facility];  Service: Orthopedics;  Laterality: Right;  . TUBAL LIGATION      FAMILY HISTORY: Family History  Problem Relation Age of Onset  . Hypertension Mother   . Dementia Mother   . Ovarian cancer Half-Sister 66  . Diabetes Half-Sister   . Stroke Half-Sister   Her father died at age 71; she is unsure of the cause of death. Her mother is age 57 as of 10/2019.  The patient has two brothers and four sisters. She reports ovarian cancer in her sister at age 8. She denies a family history of breast, prostate, or pancreatic cancer.   GYNECOLOGIC HISTORY:  No LMP recorded. Patient is postmenopausal. Menarche: 64 years old Age at first live birth: 64 years old [name] LMP around age 61 Contraceptive: used previously HRT never used  Hysterectomy? no BSO? no   SOCIAL HISTORY: (updated 10/2019)  [name] retired from working at the [employer]. She is divorced. She lives at home alone but during the pandemic her sister [name] and mother [name] lived in the same home. [name], age 70, lives in [place] and is handicapped. [name], age 34, is a Biomedical scientist here in [place]. [name] has two granddaughters. She attends a local [employer].    ADVANCED DIRECTIVES: not in place; at the 11/21/2019 visit the patient was given the appropriate documents to complete and notarized at her discretion.  She expressed spontaneously that she would not want to be resuscitated in case of a terminal event.    HEALTH MAINTENANCE: Social History   Tobacco Use  . Smoking status: Never Smoker  . Smokeless tobacco: Never Used  Vaping Use  . Vaping Use: Never used  Substance Use Topics  . Alcohol use: Yes    Alcohol/week: 3.0 standard drinks    Types: 1 Cans of beer, 2 Shots of liquor per week    Comment: daily  . Drug use: Yes    Types: Marijuana      Colonoscopy: never done  PAP: "1980?"  Bone density: never done   Allergies  Allergen Reactions  . Amlodipine Swelling    BLE edema    Current Outpatient Medications  Medication Sig Dispense Refill  . acetaminophen (TYLENOL) 500 MG tablet Take 500 mg by mouth as needed for mild pain.    Marland Kitchen anastrozole (ARIMIDEX) 1 MG tablet Take 1 tablet (1 mg total) by mouth daily. (Patient not taking: Reported on 03/25/2020) 30 tablet 0  . aspirin EC 81 MG EC tablet Take 1 tablet (81 mg total) by mouth daily. Swallow whole. 30 tablet 11  . Blood Pressure Monitor DEVI Use as instructed by provider to check bloos pressure daily. ICD: I10 1 each 0  . carvedilol (COREG) 12.5 MG tablet Take 1 tablet (12.5 mg total) by mouth 2 (two) times daily with a meal. 60 tablet 3  . colchicine 0.6 MG tablet Take 0.5 tablets (0.3 mg total) by mouth every other day. 20 tablet 0  . folic acid (FOLVITE) 1 MG tablet Take 1 tablet (1 mg total) by mouth daily.    . furosemide (LASIX) 20 MG tablet Take 1 tablet (20 mg total) by mouth daily. 30 tablet 0  .  hydrALAZINE (APRESOLINE) 25 MG tablet Take 1 tablet (25 mg total) by mouth every 8 (eight) hours. 90 tablet 3  . lidocaine-prilocaine (EMLA) cream Apply 1 application topically as needed. 30 g 0  . magic mouthwash w/lidocaine SOLN Take 5 mLs by mouth 3 (three) times daily as needed for mouth pain. (Patient not taking: Reported on 03/25/2020)    . ondansetron (ZOFRAN) 4 MG tablet Take 1 tablet (4 mg total) by mouth every 8 (eight) hours as needed for nausea or vomiting. 20 tablet 0  . oxyCODONE-acetaminophen (PERCOCET/ROXICET) 5-325 MG tablet Take 1 tablet by mouth every 6 (six) hours as needed for severe pain. 15 tablet 0  . polyethylene glycol (MIRALAX / GLYCOLAX) 17 g packet Take 17 g by mouth 2 (two) times daily. 28 each 1   No current facility-administered medications for this visit.    OBJECTIVE: African-American woman examined in a wheelchair  There were no  vitals filed for this visit.   There is no height or weight on file to calculate BMI.   Wt Readings from Last 3 Encounters:  03/25/20 (!) 300 lb (136.1 kg)  03/03/20 (!) 320 lb (145.2 kg)  02/27/20 (!) 320 lb 3.2 oz (145.2 kg)  For vitals on the March 27, 2020 visit see the records from radiation oncology   ECOG FS:2 - Symptomatic, <50% confined to bed   LAB RESULTS:  CMP     Component Value Date/Time   NA 136 03/20/2020 0533   NA 135 01/02/2020 1710   K 4.4 03/20/2020 1215   CL 102 03/20/2020 0533   CO2 28 03/20/2020 0533   GLUCOSE 89 03/20/2020 0533   BUN 15 03/20/2020 0533   BUN 25 01/02/2020 1710   CREATININE 1.11 (H) 03/20/2020 0533   CREATININE 1.59 (H) 11/21/2019 0908   CALCIUM 8.8 (L) 03/20/2020 0533   PROT 5.1 (L) 03/08/2020 0557   PROT 8.5 06/15/2019 1622   ALBUMIN 2.1 (L) 03/08/2020 0557   ALBUMIN 4.3 06/15/2019 1622   AST 16 03/08/2020 0557   AST 61 (H) 11/21/2019 0908   ALT 7 03/08/2020 0557   ALT 32 11/21/2019 0908   ALKPHOS 72 03/08/2020 0557   BILITOT 0.6 03/08/2020 0557   BILITOT 0.5 11/21/2019 0908   GFRNONAA 52 (L) 03/20/2020 0533   GFRNONAA 34 (L) 11/21/2019 0908   GFRAA >60 03/20/2020 0533   GFRAA 39 (L) 11/21/2019 0908    No results found for: TOTALPROTELP, ALBUMINELP, A1GS, A2GS, BETS, BETA2SER, GAMS, MSPIKE, SPEI  Lab Results  Component Value Date   WBC 9.5 03/25/2020   NEUTROABS 6.5 03/25/2020   HGB 10.7 (L) 03/25/2020   HCT 31.5 (L) 03/25/2020   MCV 91 03/25/2020   PLT 352 03/25/2020    No results found for: LABCA2  No components found for: FAOZHY865  No results for input(s): INR in the last 168 hours.  No results found for: LABCA2  No results found for: HQI696  No results found for: EXB284  No results found for: XLK440  No results found for: CA2729  No components found for: HGQUANT  No results found for: CEA1 / No results found for: CEA1   No results found for: AFPTUMOR  No results found for: CHROMOGRNA  No  results found for: KPAFRELGTCHN, LAMBDASER, KAPLAMBRATIO (kappa/lambda light chains)  No results found for: HGBA, HGBA2QUANT, HGBFQUANT, HGBSQUAN (Hemoglobinopathy evaluation)   No results found for: LDH  Lab Results  Component Value Date   IRON 52 03/05/2020   TIBC 157 (L)  03/05/2020   IRONPCTSAT 33 (H) 03/05/2020   (Iron and TIBC)  Lab Results  Component Value Date   FERRITIN 861 (H) 03/05/2020    Urinalysis    Component Value Date/Time   COLORURINE YELLOW 03/03/2020 1120   APPEARANCEUR CLEAR 03/03/2020 1120   LABSPEC 1.008 03/03/2020 1120   PHURINE 5.0 03/03/2020 1120   GLUCOSEU NEGATIVE 03/03/2020 1120   HGBUR SMALL (A) 03/03/2020 1120   BILIRUBINUR NEGATIVE 03/03/2020 1120   KETONESUR NEGATIVE 03/03/2020 1120   PROTEINUR NEGATIVE 03/03/2020 1120   UROBILINOGEN 1.0 07/14/2012 1630   NITRITE NEGATIVE 03/03/2020 1120   LEUKOCYTESUR NEGATIVE 03/03/2020 1120     STUDIES: CT ABDOMEN PELVIS W CONTRAST  Result Date: 03/07/2020 CLINICAL DATA:  Generalized abdominal pain. Ongoing diarrhea. History of left breast cancer with current chemotherapy. EXAM: CT ABDOMEN AND PELVIS WITH CONTRAST TECHNIQUE: Multidetector CT imaging of the abdomen and pelvis was performed using the standard protocol following bolus administration of intravenous contrast. CONTRAST:  165m OMNIPAQUE IOHEXOL 300 MG/ML  SOLN COMPARISON:  CT 07/14/2012. Renal ultrasound 01/22/2020 FINDINGS: Lower chest: No focal airspace disease or pleural effusion. There are coronary artery calcifications. Hepatobiliary: Prominent liver spanning 18 cm cranial caudal. Decreased hepatic steatosis from prior exam. Questionable nodular hepatic contours. Decompressed gallbladder without gallstones or pericholecystic inflammation. Pancreas: No ductal dilatation or inflammation. Spleen: Normal in size without focal abnormality. Splenule anteriorly unchanged from prior. Adrenals/Urinary Tract: No adrenal nodule. There is no  hydronephrosis. Symmetric bilateral perinephric edema. There are nonobstructing stones in the right kidney, largest measuring 7 mm. Parenchymal calcification in the lower left kidney. There is symmetric renal excretion on 6 minutes delay scan. Partially distended urinary bladder. Air-fluid level in the bladder, Foley catheter in place. Equivocal bladder wall thickening. Stomach/Bowel: Decompressed stomach. No small bowel obstruction or inflammation. Administered enteric contrast reaches the colon. Normal terminal ileum. No appendicitis, the appendix is not well characterized. Enteric contrast reaches the distal colon. Equivocal mild wall thickening of the distal descending and proximal sigmoid colon but no pericolonic inflammation. This is in the region of multiple diverticula. There is no acute diverticulitis. No abnormal rectal distention. Vascular/Lymphatic: Minor aortic atherosclerosis. Patent portal vein. Few lower retroperitoneal nodes that are not enlarged by size criteria. Reproductive: Uterus and bilateral adnexa are unremarkable. Other: No free air or ascites. Subcutaneous edema in the bilateral flank soft tissues. Musculoskeletal: Diffuse degenerative change throughout the spine. Mild anterior wedging of superior endplate of L3 and L4 vertebra, age indeterminate but new from 2019. There are no acute or suspicious osseous abnormalities. IMPRESSION: 1. Equivocal mild wall thickening of the distal descending and proximal sigmoid colon but no pericolonic inflammation. An element of colitis is considered in the setting of diarrhea. This is in the region of multiple diverticula, but no focal diverticulum. 2. Nonobstructing right nephrolithiasis. 3. Questionable nodular hepatic contours, can be seen with cirrhosis. Recommend correlation with cirrhosis risk factors. 4. Mild anterior wedging of superior endplate of L3 and L4 vertebra, age indeterminate but new from 2013. Aortic Atherosclerosis (ICD10-I70.0).  Electronically Signed   By: MKeith RakeM.D.   On: 03/07/2020 17:21     ELIGIBLE FOR AVAILABLE RESEARCH PROTOCOL: AET  ASSESSMENT: 64y.o.  woman status post left breast upper outer quadrant and left axillary lymph node biopsies 11/12/2019 for a clinical T2 N2, stage IIIA invasive ductal carcinoma, grade 3, estrogen and progesterone receptor positive, HER-2 not amplified, with an MIB-1 of 70%  (a) the biopsied axillary lymph node was positive  (1) genetics  testing 11/29/2019 through the Breast Cancer STAT Panel offered by [facility] found no deleterious mutations in ATM, BRCA1, BRCA2, CDH1, CHEK2, PALB2, PTEN, STK11 and TP53.  Additional testing through the Common Hereditary Cancers Panel confirmed no deleterious mutations in APC, ATM, AXIN2, BARD1, BMPR1A, BRCA1, BRCA2, BRIP1, CDH1, CDK4, CDKN2A (p14ARF), CDKN2A (p16INK4a), CHEK2, CTNNA1, DICER1, EPCAM (Deletion/duplication testing only), GREM1 (promoter region deletion/duplication testing only), KIT, MEN1, MLH1, MSH2, MSH3, MSH6, MUTYH, NBN, NF1, NHTL1, PALB2, PDGFRA, PMS2, POLD1, POLE, PTEN, RAD50, RAD51C, RAD51D, RNF43, SDHB, SDHC, SDHD, SMAD4, SMARCA4. STK11, TP53, TSC1, TSC2, and VHL.  The following genes were evaluated for sequence changes only: SDHA and HOXB13 c.251G>A variant only.  (2) status post left modified radical mastectomy 12/19/2019 for a pT2 pN1, stage IIB invasive ductal carcinoma, grade 3, with negative margins.  (a) 1 of 13 axillary lymph nodes removed was positive  (3) adjuvant chemotherapy will consist of cyclophosphamide and doxorubicin in dose dense fashion x4 starting 01/22/2020 followed by weekly paclitaxel x12  (a) baseline echocardiogram 11/26/2019 shows an ejection fraction in the 65-70% range.  (b) first cycle of cyclophosphamide and doxorubicin dose reduced to assess tolerance  (c) chemotherapy discontinued after 2 cycles because of poor tolerance  (4) adjuvant radiation ongoing  (5) anastrozole  started July 2021   PLAN: Nautika  feels much better at home and is she tells me pretty much back to her earlier functional status.  She does have her sister helping her out.  We are trying to get her set up with home health physical therapy which also will be helpful for.  She did take the anastrozole briefly while in the hospital, with no side effects.  I have reentered the prescription for her today.  I am setting her up to see [place] a little after she completes the radiation treatments to make sure she is tolerating anastrozole well and the problems with the skin breakdown have resolved.  At that point we will decide whether to go back to chemotherapy, perhaps CMF, or to give up on the chemotherapy in which case we would pull her port  She knows to call for any other issues that may develop before the next visit.  Total encounter time 20 minutes.Sarajane Jews C. Maginat, MD 03/27/20 2:34 PM Medical Oncology and Hematology [facility] [place], [place] [address] Tel. [phone]    Fax. [phone]   I, Wilburn Mylar, am acting as scribe for Dr. Virgie Dad. Orlen Leedy.  I, Lurline Del MD, have reviewed the above documentation for accuracy and completeness, and I agree with the above.   *Total Encounter Time as defined by the Centers for Medicare and Medicaid Services includes, in addition to the face-to-face time of a patient visit (documented in the note above) non-face-to-face time: obtaining and reviewing outside history, ordering and reviewing medications, tests or procedures, care coordination (communications with other health care professionals or caregivers) and documentation in the medical record.

## 2020-03-27 NOTE — Telephone Encounter (Signed)
CPT codes has been received and patient insurance company will be contacted.

## 2020-03-27 NOTE — Telephone Encounter (Signed)
Referral for [facility] PT and wound care called and faxed.

## 2020-03-27 NOTE — Telephone Encounter (Signed)
Patient was called and she currently does not have a voicemail set up to leave a message.

## 2020-03-27 NOTE — Telephone Encounter (Signed)
-----   Message from Charlott Rakes, MD sent at 03/26/2020  1:37 PM EDT ----- Labs reveal she is still anemic but this has improved significantly compared to discharge.  Continue iron tablets

## 2020-03-27 NOTE — Telephone Encounter (Signed)
This CM spoke with [facility] regarding the [facility] and he said that [employer] does not carry them and noted that the only company that would be contracted with insurance for the [place] would be [employer].  Call placed to [employer] # [phone], spoke to [place] who stated that they are not contracted with any insurance companies at this time for the [place].  The item is private pay and no prescription is needed. The patient needs to contact the company to order. They are willing to set up a payment plan with the patient.  Cost: $299 for the system and then $195 for 30 wicks.  The wicks are not included with the system.    Call placed to the patient and informed her of the above.  She said that was fine. Today she has not been incontinent. She has been able to get to the bathroom when needed.  Also informed her that the referral for PCS was sent to [facility].   She said she went to radiation today and is just feeling tired.  No other complaints/concerns reported.

## 2020-03-28 ENCOUNTER — Ambulatory Visit
Admission: RE | Admit: 2020-03-28 | Discharge: 2020-03-28 | Disposition: A | Discharge status: Home / Self Care | Payer: Medicaid Other | Source: Ambulatory Visit | Attending: Radiation Oncology | Admitting: Radiation Oncology

## 2020-03-28 ENCOUNTER — Telehealth: Payer: Self-pay | Admitting: Oncology

## 2020-03-28 DIAGNOSIS — Z510 Encounter for antineoplastic radiation therapy: Secondary | ICD-10-CM | POA: Diagnosis not present

## 2020-03-28 NOTE — Telephone Encounter (Signed)
Scheduled appts per 7/29 los. Pt didn't answer and voicemail box was full. Mailed appt reminder and calendar.

## 2020-03-31 ENCOUNTER — Encounter: Payer: Self-pay | Admitting: *Deleted

## 2020-03-31 ENCOUNTER — Ambulatory Visit
Admission: RE | Admit: 2020-03-31 | Discharge: 2020-03-31 | Disposition: A | Discharge status: Home / Self Care | Payer: Medicaid Other | Source: Ambulatory Visit | Attending: Radiation Oncology | Admitting: Radiation Oncology

## 2020-03-31 ENCOUNTER — Other Ambulatory Visit: Payer: Self-pay

## 2020-03-31 DIAGNOSIS — Z510 Encounter for antineoplastic radiation therapy: Secondary | ICD-10-CM | POA: Insufficient documentation

## 2020-03-31 DIAGNOSIS — Z170 Estrogen receptor positive status [ER+]: Secondary | ICD-10-CM | POA: Diagnosis not present

## 2020-03-31 DIAGNOSIS — C50412 Malignant neoplasm of upper-outer quadrant of left female breast: Secondary | ICD-10-CM | POA: Insufficient documentation

## 2020-04-01 ENCOUNTER — Telehealth: Payer: Self-pay | Admitting: *Deleted

## 2020-04-01 ENCOUNTER — Other Ambulatory Visit: Payer: Self-pay

## 2020-04-01 ENCOUNTER — Ambulatory Visit
Admission: RE | Admit: 2020-04-01 | Discharge: 2020-04-01 | Disposition: A | Discharge status: Home / Self Care | Payer: Medicaid Other | Source: Ambulatory Visit | Attending: Radiation Oncology | Admitting: Radiation Oncology

## 2020-04-01 DIAGNOSIS — Z510 Encounter for antineoplastic radiation therapy: Secondary | ICD-10-CM | POA: Diagnosis not present

## 2020-04-01 NOTE — Telephone Encounter (Signed)
Pt left a VM stating " Dr Jana Hakim didn't call in the antiestrogen he said he wanted me to start "  This RN returned call and obtained message stating the pt's VM box was full and could not leave a message.  Note prescription was sent on 7/29 for the antiestrogen to pharmacy in [place] [facility] [address].  Noted on order as " receipt received by the pharmacy " on 03/27/2020.  This RN will try again at later time to reach pt.

## 2020-04-02 ENCOUNTER — Other Ambulatory Visit: Payer: Self-pay

## 2020-04-02 ENCOUNTER — Ambulatory Visit
Admission: RE | Admit: 2020-04-02 | Discharge: 2020-04-02 | Disposition: A | Discharge status: Home / Self Care | Payer: Medicaid Other | Source: Ambulatory Visit | Attending: Radiation Oncology | Admitting: Radiation Oncology

## 2020-04-02 DIAGNOSIS — Z510 Encounter for antineoplastic radiation therapy: Secondary | ICD-10-CM | POA: Diagnosis not present

## 2020-04-03 ENCOUNTER — Ambulatory Visit
Admission: RE | Admit: 2020-04-03 | Discharge: 2020-04-03 | Disposition: A | Discharge status: Home / Self Care | Payer: Medicaid Other | Source: Ambulatory Visit | Attending: Radiation Oncology | Admitting: Radiation Oncology

## 2020-04-03 ENCOUNTER — Other Ambulatory Visit: Payer: Self-pay

## 2020-04-03 DIAGNOSIS — Z510 Encounter for antineoplastic radiation therapy: Secondary | ICD-10-CM | POA: Diagnosis not present

## 2020-04-03 NOTE — Telephone Encounter (Signed)
04/03/20 Called patient to remind her to call [employer] ([place]) [phone] for future transportation needs. Received email from [facility] in [place] that patient has scheduled all her transportation with them for future appointments. Ambrose Mantle [phone]

## 2020-04-04 ENCOUNTER — Other Ambulatory Visit: Payer: Self-pay

## 2020-04-04 ENCOUNTER — Ambulatory Visit
Admission: RE | Admit: 2020-04-04 | Discharge: 2020-04-04 | Disposition: A | Discharge status: Home / Self Care | Payer: Medicaid Other | Source: Ambulatory Visit | Attending: Radiation Oncology | Admitting: Radiation Oncology

## 2020-04-04 DIAGNOSIS — Z510 Encounter for antineoplastic radiation therapy: Secondary | ICD-10-CM | POA: Diagnosis not present

## 2020-04-07 ENCOUNTER — Ambulatory Visit
Admission: RE | Admit: 2020-04-07 | Discharge: 2020-04-07 | Disposition: A | Discharge status: Home / Self Care | Payer: Medicaid Other | Source: Ambulatory Visit | Attending: Radiation Oncology | Admitting: Radiation Oncology

## 2020-04-07 ENCOUNTER — Other Ambulatory Visit: Payer: Self-pay

## 2020-04-07 ENCOUNTER — Other Ambulatory Visit: Payer: Self-pay | Admitting: *Deleted

## 2020-04-07 DIAGNOSIS — Z510 Encounter for antineoplastic radiation therapy: Secondary | ICD-10-CM | POA: Diagnosis not present

## 2020-04-07 NOTE — Patient Outreach (Signed)
Care Coordination  04/07/2020  [name] [birth year] [id number]   Subjective: Telephone call to patient's home  / mobile number, no answer, left HIPAA compliant voicemail message, and requested call back.    Objective: Per KPN (Knowledge Performance Now, point of care tool) and chart review, patient hospitalized 03/03/2020 - 03/21/2020 for multiple skin breakdown and cellulitis in abdominal pannusarea/perineum/inner thighs, pressure ulcer on buttocks POA (present on admission).   Patient has a history of hypertension, lymphedema, prediabetes (last A1C 6.1 on 03/03/2020), gout, T2N1 stage IIb, grade 3 ER positive left breast cancer status post mastectomy in April 2021 (12/19/2019) with negative margins undergoing adjuvant chemotherapy, Adriamycin plus cyclophosphamide, last dose given last week (week of 02/24/2020), Diverticulitis, Heart murmur, and anemia of chronic disease.       Assessment: Received Managed Medicaid Referral on 03/24/2020.  Referral source: Risa Grill and Janalyn Shy.    Referral reason: From Alisa Gilboy: Thank you, Anastasiya. I will ask the [facility] team member to help this patient with transportation. If you would please refer the patient to the [facility] for RN Care Manager to address skin breakdown and associated needs, I would be grateful.  Patient needs transportation to appt 03/27/20 at 1300; patient states she was have pure-wick device, she is incontinent of urine. She also says she can not keep her wounds dry.    Screening  follow up pending patient contact.       Plan: RNCM will send unsuccessful outreach  letter, [facility] pamphlet, will call patient for 2nd telephone outreach attempt within 7 business days, screening follow up, and will proceed with case closure, after 4th unsuccessful outreach call.       Carnita Golob H. Annia Friendly, BSN, [place] Management [facility] Telephonic CM Phone: [phone] Fax: [phone]

## 2020-04-08 ENCOUNTER — Ambulatory Visit
Admission: RE | Admit: 2020-04-08 | Discharge: 2020-04-08 | Disposition: A | Discharge status: Home / Self Care | Payer: Medicaid Other | Source: Ambulatory Visit | Attending: Radiation Oncology | Admitting: Radiation Oncology

## 2020-04-08 ENCOUNTER — Other Ambulatory Visit: Payer: Self-pay

## 2020-04-08 DIAGNOSIS — Z510 Encounter for antineoplastic radiation therapy: Secondary | ICD-10-CM | POA: Diagnosis not present

## 2020-04-09 ENCOUNTER — Ambulatory Visit
Admission: RE | Admit: 2020-04-09 | Discharge: 2020-04-09 | Disposition: A | Discharge status: Home / Self Care | Payer: Medicaid Other | Source: Ambulatory Visit | Attending: Radiation Oncology | Admitting: Radiation Oncology

## 2020-04-09 ENCOUNTER — Other Ambulatory Visit: Payer: Self-pay

## 2020-04-09 DIAGNOSIS — Z510 Encounter for antineoplastic radiation therapy: Secondary | ICD-10-CM | POA: Diagnosis not present

## 2020-04-10 ENCOUNTER — Ambulatory Visit
Admission: RE | Admit: 2020-04-10 | Discharge: 2020-04-10 | Disposition: A | Discharge status: Home / Self Care | Payer: Medicaid Other | Source: Ambulatory Visit | Attending: Radiation Oncology | Admitting: Radiation Oncology

## 2020-04-10 ENCOUNTER — Other Ambulatory Visit: Payer: Self-pay

## 2020-04-10 DIAGNOSIS — Z510 Encounter for antineoplastic radiation therapy: Secondary | ICD-10-CM | POA: Diagnosis not present

## 2020-04-11 ENCOUNTER — Ambulatory Visit
Admission: RE | Admit: 2020-04-11 | Discharge: 2020-04-11 | Disposition: A | Discharge status: Home / Self Care | Payer: Medicaid Other | Source: Ambulatory Visit | Attending: Radiation Oncology | Admitting: Radiation Oncology

## 2020-04-11 ENCOUNTER — Ambulatory Visit: Payer: Self-pay | Admitting: *Deleted

## 2020-04-11 DIAGNOSIS — Z510 Encounter for antineoplastic radiation therapy: Secondary | ICD-10-CM | POA: Diagnosis not present

## 2020-04-14 ENCOUNTER — Ambulatory Visit: Payer: Medicaid Other

## 2020-04-15 ENCOUNTER — Ambulatory Visit
Admission: RE | Admit: 2020-04-15 | Discharge: 2020-04-15 | Disposition: A | Discharge status: Home / Self Care | Payer: Medicaid Other | Source: Ambulatory Visit | Attending: Radiation Oncology | Admitting: Radiation Oncology

## 2020-04-15 DIAGNOSIS — Z510 Encounter for antineoplastic radiation therapy: Secondary | ICD-10-CM | POA: Diagnosis not present

## 2020-04-16 ENCOUNTER — Other Ambulatory Visit: Payer: Self-pay

## 2020-04-16 ENCOUNTER — Ambulatory Visit
Admission: RE | Admit: 2020-04-16 | Discharge: 2020-04-16 | Disposition: A | Discharge status: Home / Self Care | Payer: Medicaid Other | Source: Ambulatory Visit | Attending: Radiation Oncology | Admitting: Radiation Oncology

## 2020-04-16 ENCOUNTER — Ambulatory Visit: Payer: Self-pay | Admitting: *Deleted

## 2020-04-16 DIAGNOSIS — Z510 Encounter for antineoplastic radiation therapy: Secondary | ICD-10-CM | POA: Diagnosis not present

## 2020-04-17 ENCOUNTER — Ambulatory Visit
Admission: RE | Admit: 2020-04-17 | Discharge: 2020-04-17 | Disposition: A | Discharge status: Home / Self Care | Payer: Medicaid Other | Source: Ambulatory Visit | Attending: Radiation Oncology | Admitting: Radiation Oncology

## 2020-04-17 DIAGNOSIS — Z510 Encounter for antineoplastic radiation therapy: Secondary | ICD-10-CM | POA: Diagnosis not present

## 2020-04-18 ENCOUNTER — Other Ambulatory Visit: Payer: Self-pay | Admitting: Family Medicine

## 2020-04-18 ENCOUNTER — Other Ambulatory Visit: Payer: Self-pay

## 2020-04-18 ENCOUNTER — Ambulatory Visit
Admission: RE | Admit: 2020-04-18 | Discharge: 2020-04-18 | Disposition: A | Discharge status: Home / Self Care | Payer: Medicaid Other | Source: Ambulatory Visit | Attending: Radiation Oncology | Admitting: Radiation Oncology

## 2020-04-18 DIAGNOSIS — Z510 Encounter for antineoplastic radiation therapy: Secondary | ICD-10-CM | POA: Diagnosis not present

## 2020-04-21 ENCOUNTER — Ambulatory Visit
Admission: RE | Admit: 2020-04-21 | Discharge: 2020-04-21 | Disposition: A | Discharge status: Home / Self Care | Payer: Medicaid Other | Source: Ambulatory Visit | Attending: Radiation Oncology | Admitting: Radiation Oncology

## 2020-04-21 ENCOUNTER — Other Ambulatory Visit: Payer: Self-pay

## 2020-04-21 DIAGNOSIS — Z510 Encounter for antineoplastic radiation therapy: Secondary | ICD-10-CM | POA: Diagnosis not present

## 2020-04-22 ENCOUNTER — Ambulatory Visit: Payer: Medicaid Other

## 2020-04-23 ENCOUNTER — Encounter: Payer: Self-pay | Admitting: *Deleted

## 2020-04-23 ENCOUNTER — Other Ambulatory Visit: Payer: Self-pay | Admitting: *Deleted

## 2020-04-23 ENCOUNTER — Ambulatory Visit: Payer: Medicaid Other

## 2020-04-23 NOTE — Patient Outreach (Signed)
[facility]) Care Management [facility] CM Telephone Outreach- Managed Medicaid referral from 03/24/20 Post-hospital discharge day # 33 Unsuccessful (consecutive) second outreach attempt 04/23/2020  [name] 06-25-1956 [id number]  Unsuccessful (consecutive) telephone outreach attempt to [name], 64 y/o female referred to this [facility] RN CM 04/17/20 by [place] leadership from previously assigned [facility] RN CM for follow up on care coordination managed medicaid needs.  Patient had recent hospitalization July 5-23, 2021 for cellulitis/ skin breakdown; she was discharged home to self-care with home health services through [place].  Patient has history including, but not limited to, HTN; lymphedema; breast CA with (L) mastectomy in April 2021; pressure sores; morbid obesity; alcohol use/ abuse.  With call attempt today, unable to leave voice mail for patient requesting call back-- received automated outgoing message stating that patient's voice mail box is full.  Plan:  Verified [facility] CM unsuccessful patient outreach letter placed in mail requesting call back in writing on 04/07/20  Will re-attempt [facility] CM telephone outreach within 4 business days if I do not hear back from patient first  Oneta Rack, RN, BSN, [employer] Coordinator [facility] Care Management  [phone]

## 2020-04-24 ENCOUNTER — Other Ambulatory Visit: Payer: Self-pay

## 2020-04-24 ENCOUNTER — Ambulatory Visit: Payer: Medicaid Other

## 2020-04-24 ENCOUNTER — Ambulatory Visit
Admission: RE | Admit: 2020-04-24 | Discharge: 2020-04-24 | Disposition: A | Discharge status: Home / Self Care | Payer: Medicaid Other | Source: Ambulatory Visit | Attending: Radiation Oncology | Admitting: Radiation Oncology

## 2020-04-24 DIAGNOSIS — Z510 Encounter for antineoplastic radiation therapy: Secondary | ICD-10-CM | POA: Diagnosis not present

## 2020-04-25 ENCOUNTER — Other Ambulatory Visit: Payer: Self-pay

## 2020-04-25 ENCOUNTER — Ambulatory Visit: Payer: Medicaid Other

## 2020-04-25 ENCOUNTER — Ambulatory Visit
Admission: RE | Admit: 2020-04-25 | Discharge: 2020-04-25 | Disposition: A | Discharge status: Home / Self Care | Payer: Medicaid Other | Source: Ambulatory Visit | Attending: Radiation Oncology | Admitting: Radiation Oncology

## 2020-04-25 ENCOUNTER — Ambulatory Visit: Payer: Medicaid Other | Admitting: Radiation Oncology

## 2020-04-25 ENCOUNTER — Encounter (HOSPITAL_BASED_OUTPATIENT_CLINIC_OR_DEPARTMENT_OTHER): Payer: Medicaid Other | Admitting: Internal Medicine

## 2020-04-25 DIAGNOSIS — Z510 Encounter for antineoplastic radiation therapy: Secondary | ICD-10-CM | POA: Diagnosis not present

## 2020-04-26 ENCOUNTER — Ambulatory Visit: Payer: Medicaid Other

## 2020-04-28 ENCOUNTER — Ambulatory Visit: Payer: Medicaid Other

## 2020-04-28 ENCOUNTER — Encounter (HOSPITAL_COMMUNITY): Payer: Self-pay

## 2020-04-28 ENCOUNTER — Other Ambulatory Visit: Payer: Self-pay

## 2020-04-28 DIAGNOSIS — Z9221 Personal history of antineoplastic chemotherapy: Secondary | ICD-10-CM

## 2020-04-28 DIAGNOSIS — L03311 Cellulitis of abdominal wall: Principal | ICD-10-CM | POA: Diagnosis present

## 2020-04-28 DIAGNOSIS — D631 Anemia in chronic kidney disease: Secondary | ICD-10-CM | POA: Diagnosis present

## 2020-04-28 DIAGNOSIS — N1831 Chronic kidney disease, stage 3a: Secondary | ICD-10-CM | POA: Diagnosis present

## 2020-04-28 DIAGNOSIS — Z20822 Contact with and (suspected) exposure to covid-19: Secondary | ICD-10-CM | POA: Diagnosis present

## 2020-04-28 DIAGNOSIS — I129 Hypertensive chronic kidney disease with stage 1 through stage 4 chronic kidney disease, or unspecified chronic kidney disease: Secondary | ICD-10-CM | POA: Diagnosis present

## 2020-04-28 DIAGNOSIS — M7989 Other specified soft tissue disorders: Secondary | ICD-10-CM | POA: Diagnosis not present

## 2020-04-28 DIAGNOSIS — L03116 Cellulitis of left lower limb: Secondary | ICD-10-CM | POA: Diagnosis present

## 2020-04-28 DIAGNOSIS — Z7982 Long term (current) use of aspirin: Secondary | ICD-10-CM

## 2020-04-28 DIAGNOSIS — Z923 Personal history of irradiation: Secondary | ICD-10-CM

## 2020-04-28 DIAGNOSIS — Z9012 Acquired absence of left breast and nipple: Secondary | ICD-10-CM

## 2020-04-28 DIAGNOSIS — B354 Tinea corporis: Secondary | ICD-10-CM | POA: Diagnosis present

## 2020-04-28 DIAGNOSIS — C50412 Malignant neoplasm of upper-outer quadrant of left female breast: Secondary | ICD-10-CM | POA: Diagnosis present

## 2020-04-28 DIAGNOSIS — R52 Pain, unspecified: Secondary | ICD-10-CM | POA: Diagnosis not present

## 2020-04-28 DIAGNOSIS — Z170 Estrogen receptor positive status [ER+]: Secondary | ICD-10-CM

## 2020-04-28 DIAGNOSIS — L03115 Cellulitis of right lower limb: Secondary | ICD-10-CM | POA: Diagnosis present

## 2020-04-28 DIAGNOSIS — R609 Edema, unspecified: Secondary | ICD-10-CM | POA: Diagnosis not present

## 2020-04-28 DIAGNOSIS — Z6841 Body Mass Index (BMI) 40.0 and over, adult: Secondary | ICD-10-CM

## 2020-04-28 DIAGNOSIS — Z79899 Other long term (current) drug therapy: Secondary | ICD-10-CM

## 2020-04-28 DIAGNOSIS — Z79811 Long term (current) use of aromatase inhibitors: Secondary | ICD-10-CM

## 2020-04-28 DIAGNOSIS — I959 Hypotension, unspecified: Secondary | ICD-10-CM | POA: Diagnosis not present

## 2020-04-28 DIAGNOSIS — R601 Generalized edema: Secondary | ICD-10-CM | POA: Diagnosis not present

## 2020-04-28 NOTE — ED Notes (Signed)
Blue and gold top x2 sent to the lab to save.

## 2020-04-28 NOTE — ED Triage Notes (Signed)
Patient arrived via [facility] from home with pain and swelling x2 weeks bilateral arms and legs after starting chemo. No other complaints at this time.

## 2020-04-29 ENCOUNTER — Other Ambulatory Visit: Payer: Self-pay | Admitting: *Deleted

## 2020-04-29 ENCOUNTER — Emergency Department (HOSPITAL_COMMUNITY): Payer: Medicaid Other

## 2020-04-29 ENCOUNTER — Encounter (HOSPITAL_COMMUNITY): Payer: Self-pay | Admitting: Internal Medicine

## 2020-04-29 ENCOUNTER — Ambulatory Visit: Payer: Medicaid Other

## 2020-04-29 ENCOUNTER — Inpatient Hospital Stay (HOSPITAL_COMMUNITY)
Admission: EM | Admit: 2020-04-29 | Discharge: 2020-05-01 | DRG: 603 | Disposition: A | Discharge status: Home / Self Care | Payer: Medicaid Other | Attending: Internal Medicine | Admitting: Internal Medicine

## 2020-04-29 DIAGNOSIS — Z923 Personal history of irradiation: Secondary | ICD-10-CM | POA: Diagnosis not present

## 2020-04-29 DIAGNOSIS — L03311 Cellulitis of abdominal wall: Secondary | ICD-10-CM | POA: Diagnosis not present

## 2020-04-29 DIAGNOSIS — M7989 Other specified soft tissue disorders: Secondary | ICD-10-CM

## 2020-04-29 DIAGNOSIS — Z79899 Other long term (current) drug therapy: Secondary | ICD-10-CM | POA: Diagnosis not present

## 2020-04-29 DIAGNOSIS — D638 Anemia in other chronic diseases classified elsewhere: Secondary | ICD-10-CM | POA: Diagnosis present

## 2020-04-29 DIAGNOSIS — D631 Anemia in chronic kidney disease: Secondary | ICD-10-CM | POA: Diagnosis not present

## 2020-04-29 DIAGNOSIS — L03119 Cellulitis of unspecified part of limb: Secondary | ICD-10-CM

## 2020-04-29 DIAGNOSIS — R601 Generalized edema: Secondary | ICD-10-CM

## 2020-04-29 DIAGNOSIS — I10 Essential (primary) hypertension: Secondary | ICD-10-CM | POA: Diagnosis not present

## 2020-04-29 DIAGNOSIS — Z7982 Long term (current) use of aspirin: Secondary | ICD-10-CM | POA: Diagnosis not present

## 2020-04-29 DIAGNOSIS — I129 Hypertensive chronic kidney disease with stage 1 through stage 4 chronic kidney disease, or unspecified chronic kidney disease: Secondary | ICD-10-CM | POA: Diagnosis not present

## 2020-04-29 DIAGNOSIS — Z79811 Long term (current) use of aromatase inhibitors: Secondary | ICD-10-CM | POA: Diagnosis not present

## 2020-04-29 DIAGNOSIS — Z170 Estrogen receptor positive status [ER+]: Secondary | ICD-10-CM

## 2020-04-29 DIAGNOSIS — C50412 Malignant neoplasm of upper-outer quadrant of left female breast: Secondary | ICD-10-CM | POA: Diagnosis not present

## 2020-04-29 DIAGNOSIS — L03116 Cellulitis of left lower limb: Secondary | ICD-10-CM | POA: Diagnosis not present

## 2020-04-29 DIAGNOSIS — Z20822 Contact with and (suspected) exposure to covid-19: Secondary | ICD-10-CM | POA: Diagnosis not present

## 2020-04-29 DIAGNOSIS — Z9221 Personal history of antineoplastic chemotherapy: Secondary | ICD-10-CM | POA: Diagnosis not present

## 2020-04-29 DIAGNOSIS — L03115 Cellulitis of right lower limb: Secondary | ICD-10-CM | POA: Diagnosis not present

## 2020-04-29 DIAGNOSIS — B354 Tinea corporis: Secondary | ICD-10-CM | POA: Diagnosis present

## 2020-04-29 DIAGNOSIS — N1831 Chronic kidney disease, stage 3a: Secondary | ICD-10-CM | POA: Diagnosis not present

## 2020-04-29 DIAGNOSIS — Z6841 Body Mass Index (BMI) 40.0 and over, adult: Secondary | ICD-10-CM | POA: Diagnosis not present

## 2020-04-29 DIAGNOSIS — Z9012 Acquired absence of left breast and nipple: Secondary | ICD-10-CM | POA: Diagnosis not present

## 2020-04-29 LAB — URINALYSIS, ROUTINE W REFLEX MICROSCOPIC
Bilirubin Urine: NEGATIVE
Glucose, UA: NEGATIVE mg/dL
Hgb urine dipstick: NEGATIVE
Ketones, ur: NEGATIVE mg/dL
Leukocytes,Ua: NEGATIVE
Nitrite: NEGATIVE
Protein, ur: NEGATIVE mg/dL
Specific Gravity, Urine: 1.006 (ref 1.005–1.030)
pH: 5 (ref 5.0–8.0)

## 2020-04-29 LAB — BASIC METABOLIC PANEL
Anion gap: 17 — ABNORMAL HIGH (ref 5–15)
BUN: 12 mg/dL (ref 8–23)
CO2: 23 mmol/L (ref 22–32)
Calcium: 8.3 mg/dL — ABNORMAL LOW (ref 8.9–10.3)
Chloride: 102 mmol/L (ref 98–111)
Creatinine, Ser: 1.2 mg/dL — ABNORMAL HIGH (ref 0.44–1.00)
GFR calc Af Amer: 55 mL/min — ABNORMAL LOW (ref >60)
GFR calc non Af Amer: 48 mL/min — ABNORMAL LOW (ref >60)
Glucose, Bld: 104 mg/dL — ABNORMAL HIGH (ref 70–99)
Potassium: 3.8 mmol/L (ref 3.5–5.1)
Sodium: 142 mmol/L (ref 135–145)

## 2020-04-29 LAB — HEPATIC FUNCTION PANEL
ALT: 25 U/L (ref 0–44)
AST: 64 U/L — ABNORMAL HIGH (ref 15–41)
Albumin: 2.8 g/dL — ABNORMAL LOW (ref 3.5–5.0)
Alkaline Phosphatase: 105 U/L (ref 38–126)
Bilirubin, Direct: 0.3 mg/dL — ABNORMAL HIGH (ref 0.0–0.2)
Indirect Bilirubin: 0.6 mg/dL (ref 0.3–0.9)
Total Bilirubin: 0.9 mg/dL (ref 0.3–1.2)
Total Protein: 7.3 g/dL (ref 6.5–8.1)

## 2020-04-29 LAB — LIPASE, BLOOD: Lipase: 29 U/L (ref 11–51)

## 2020-04-29 LAB — CBC
HCT: 29.1 % — ABNORMAL LOW (ref 36.0–46.0)
Hemoglobin: 9.4 g/dL — ABNORMAL LOW (ref 12.0–15.0)
MCH: 32.4 pg (ref 26.0–34.0)
MCHC: 32.3 g/dL (ref 30.0–36.0)
MCV: 100.3 fL — ABNORMAL HIGH (ref 80.0–100.0)
Platelets: 168 10*3/uL (ref 150–400)
RBC: 2.9 MIL/uL — ABNORMAL LOW (ref 3.87–5.11)
RDW: 18.8 % — ABNORMAL HIGH (ref 11.5–15.5)
WBC: 4.5 10*3/uL (ref 4.0–10.5)
nRBC: 0 % (ref 0.0–0.2)

## 2020-04-29 LAB — SARS CORONAVIRUS 2 BY RT PCR (HOSPITAL ORDER, PERFORMED IN ~~LOC~~ HOSPITAL LAB): SARS Coronavirus 2: NEGATIVE

## 2020-04-29 LAB — C-REACTIVE PROTEIN: CRP: 1.9 mg/dL — ABNORMAL HIGH (ref <1.0)

## 2020-04-29 LAB — BRAIN NATRIURETIC PEPTIDE: B Natriuretic Peptide: 214.4 pg/mL — ABNORMAL HIGH (ref 0.0–100.0)

## 2020-04-29 IMAGING — CR DG CHEST 2V
2 series · 2 of 2 positions shown · non-contrast
Comparison: [DATE]

CLINICAL DATA: Anasarca, upper outer quadrant left breast cancer
with lymph node metastases

EXAM:
CHEST - 2 VIEW

[w chest lat]
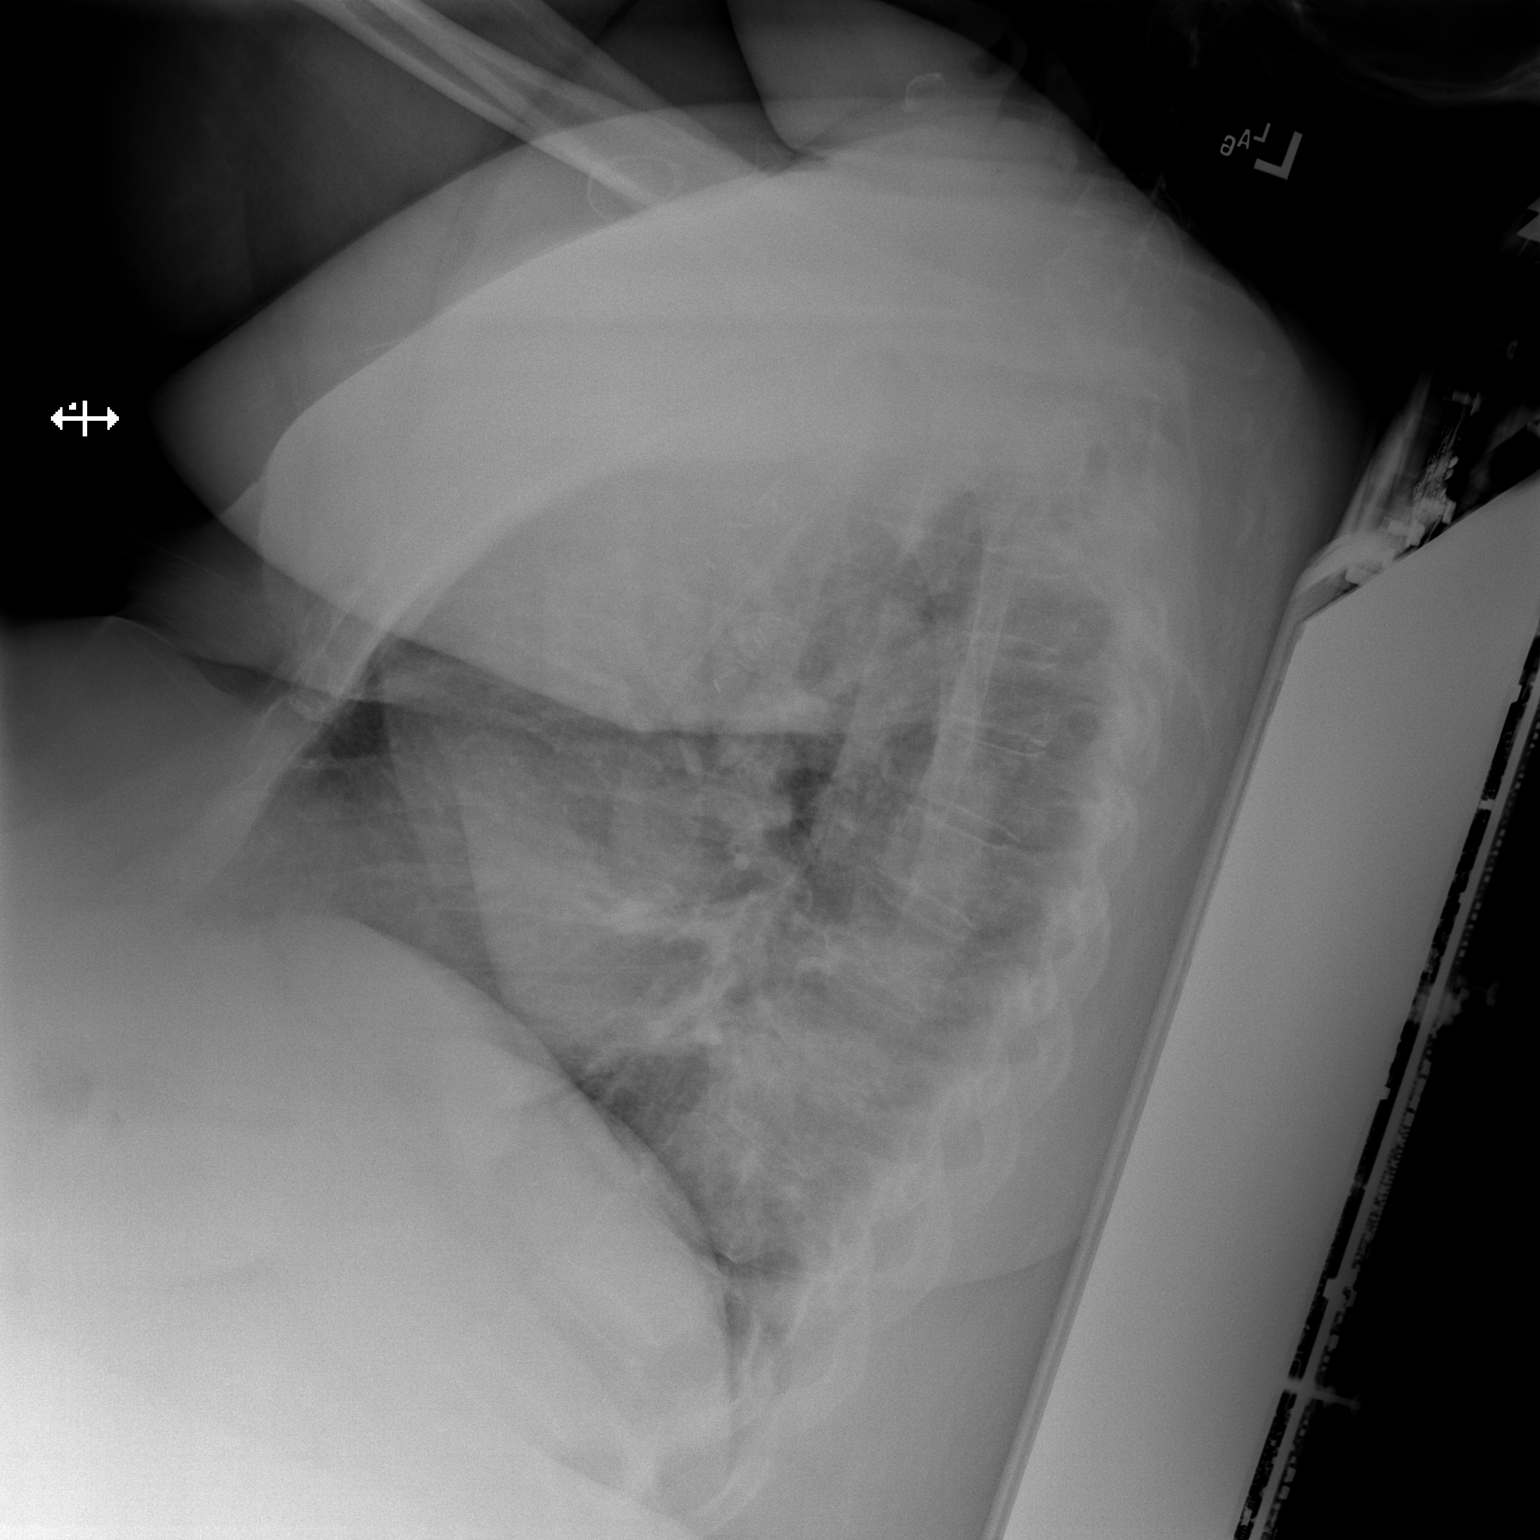

[x chest ap]
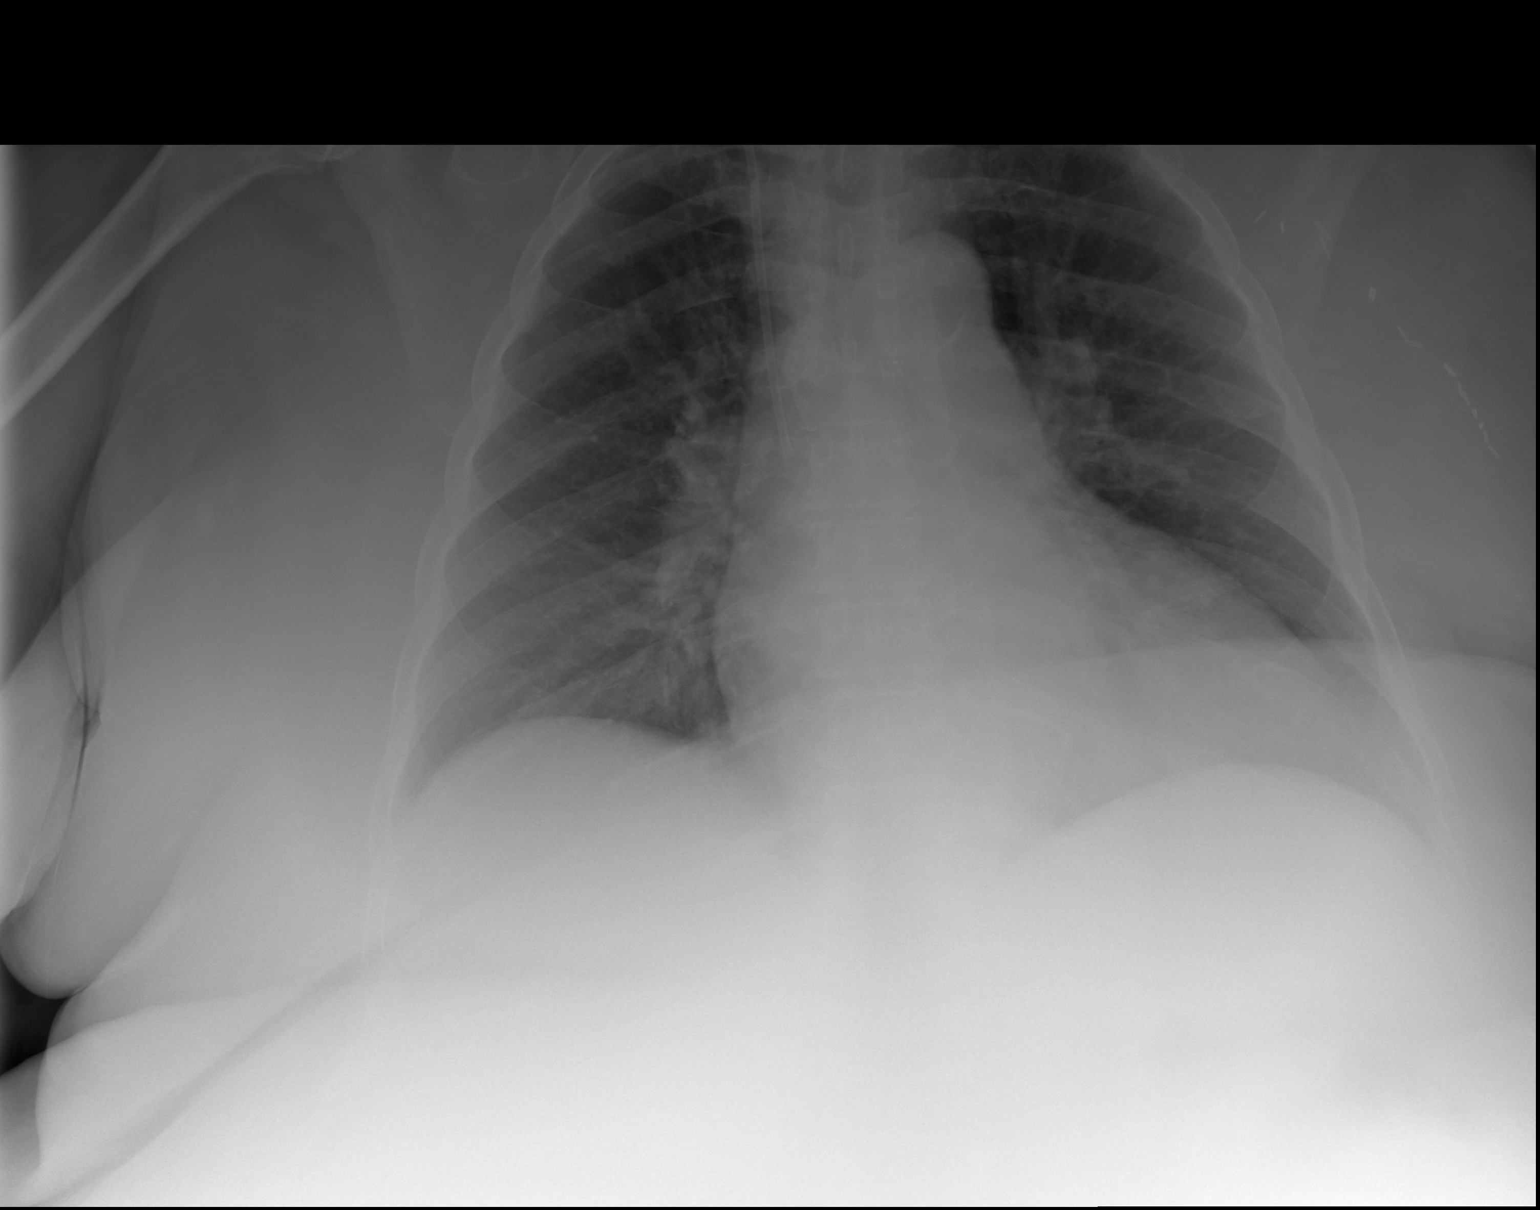

[2 of 2 positions shown; findings below may reference images not displayed]

FINDINGS: Frontal and lateral views of the chest demonstrates stable right
chest wall port via subclavian approach, tip within the superior
vena cava. The cardiac silhouette is enlarged but stable. No acute
airspace disease, effusion, or pneumothorax. Postsurgical changes
within the left breast and axilla. No acute or destructive bony
lesions.
IMPRESSION: 1. Stable exam, no acute process.

## 2020-04-29 MED ORDER — FOLIC ACID 1 MG PO TABS
1.0000 mg | ORAL_TABLET | Freq: Every day | ORAL | Status: DC
  Administered 2020-04-29 – 2020-05-01 (×3): 1 mg via ORAL
  Filled 2020-04-29 (×3): qty 1

## 2020-04-29 MED ORDER — ACETAMINOPHEN 650 MG RE SUPP: 650.0000 mg | Freq: Four times a day (QID) | RECTAL | Status: DC | PRN

## 2020-04-29 MED ORDER — ACETAMINOPHEN 325 MG PO TABS: 650.0000 mg | ORAL_TABLET | Freq: Four times a day (QID) | ORAL | Status: DC | PRN

## 2020-04-29 MED ORDER — SODIUM CHLORIDE 0.9 % IV SOLN
INTRAVENOUS | Status: DC | PRN
  Administered 2020-04-29: 250 mL via INTRAVENOUS

## 2020-04-29 MED ORDER — HYDRALAZINE HCL 25 MG PO TABS
25.0000 mg | ORAL_TABLET | Freq: Three times a day (TID) | ORAL | Status: DC
  Administered 2020-04-29 – 2020-05-01 (×6): 25 mg via ORAL
  Filled 2020-04-29 (×6): qty 1

## 2020-04-29 MED ORDER — LACTATED RINGERS IV BOLUS
500.0000 mL | Freq: Once | INTRAVENOUS | Status: AC
  Administered 2020-04-29: 500 mL via INTRAVENOUS

## 2020-04-29 MED ORDER — SODIUM CHLORIDE 0.9% FLUSH
10.0000 mL | INTRAVENOUS | Status: DC | PRN
  Administered 2020-04-30: 10 mL

## 2020-04-29 MED ORDER — HYDROMORPHONE HCL 1 MG/ML IJ SOLN
1.0000 mg | INTRAMUSCULAR | Status: DC | PRN
  Administered 2020-04-29 – 2020-05-01 (×9): 1 mg via INTRAVENOUS
  Filled 2020-04-29 (×9): qty 1

## 2020-04-29 MED ORDER — SODIUM CHLORIDE 0.9 % IV SOLN
1.0000 g | Freq: Once | INTRAVENOUS | Status: AC
  Administered 2020-04-29: 1 g via INTRAVENOUS
  Filled 2020-04-29: qty 1

## 2020-04-29 MED ORDER — ANASTROZOLE 1 MG PO TABS
1.0000 mg | ORAL_TABLET | Freq: Every day | ORAL | Status: DC
  Administered 2020-04-29 – 2020-05-01 (×3): 1 mg via ORAL
  Filled 2020-04-29 (×2): qty 1

## 2020-04-29 MED ORDER — HYDROMORPHONE HCL 1 MG/ML IJ SOLN: 0.5000 mg | INTRAMUSCULAR | Status: DC | PRN

## 2020-04-29 MED ORDER — FLUCONAZOLE IN SODIUM CHLORIDE 200-0.9 MG/100ML-% IV SOLN
200.0000 mg | INTRAVENOUS | Status: DC
  Administered 2020-04-29: 200 mg via INTRAVENOUS
  Filled 2020-04-29 (×2): qty 100

## 2020-04-29 MED ORDER — POLYETHYLENE GLYCOL 3350 17 G PO PACK: 17.0000 g | PACK | Freq: Every day | ORAL | Status: DC | PRN

## 2020-04-29 MED ORDER — VANCOMYCIN HCL 2000 MG/400ML IV SOLN
2000.0000 mg | Freq: Once | INTRAVENOUS | Status: AC
  Administered 2020-04-29: 2000 mg via INTRAVENOUS
  Filled 2020-04-29: qty 400

## 2020-04-29 MED ORDER — VANCOMYCIN HCL 1500 MG/300ML IV SOLN
1500.0000 mg | INTRAVENOUS | Status: DC
  Administered 2020-04-30 – 2020-05-01 (×2): 1500 mg via INTRAVENOUS
  Filled 2020-04-29 (×2): qty 300

## 2020-04-29 MED ORDER — ASPIRIN EC 81 MG PO TBEC
81.0000 mg | DELAYED_RELEASE_TABLET | Freq: Every day | ORAL | Status: DC
  Administered 2020-04-29 – 2020-05-01 (×3): 81 mg via ORAL
  Filled 2020-04-29 (×3): qty 1

## 2020-04-29 MED ORDER — CARVEDILOL 12.5 MG PO TABS
12.5000 mg | ORAL_TABLET | Freq: Two times a day (BID) | ORAL | Status: DC
  Administered 2020-04-29 – 2020-05-01 (×5): 12.5 mg via ORAL
  Filled 2020-04-29 (×5): qty 1

## 2020-04-29 MED ORDER — ONDANSETRON HCL 4 MG/2ML IJ SOLN
4.0000 mg | Freq: Four times a day (QID) | INTRAMUSCULAR | Status: DC | PRN
  Administered 2020-04-29 – 2020-04-30 (×2): 4 mg via INTRAVENOUS
  Filled 2020-04-29 (×2): qty 2

## 2020-04-29 MED ORDER — CHLORHEXIDINE GLUCONATE CLOTH 2 % EX PADS
6.0000 | MEDICATED_PAD | Freq: Every morning | CUTANEOUS | Status: DC
  Administered 2020-04-30 – 2020-05-01 (×2): 6 via TOPICAL

## 2020-04-29 MED ORDER — SODIUM CHLORIDE 0.9 % IV SOLN
1.0000 g | Freq: Three times a day (TID) | INTRAVENOUS | Status: DC
  Administered 2020-04-29 – 2020-04-30 (×3): 1 g via INTRAVENOUS
  Filled 2020-04-29 (×3): qty 1

## 2020-04-29 MED ORDER — LACTATED RINGERS IV SOLN: INTRAVENOUS | Status: AC

## 2020-04-29 MED ORDER — FUROSEMIDE 10 MG/ML IJ SOLN
40.0000 mg | Freq: Once | INTRAMUSCULAR | Status: AC
  Administered 2020-04-29: 40 mg via INTRAVENOUS
  Filled 2020-04-29: qty 4

## 2020-04-29 MED ORDER — ENOXAPARIN SODIUM 60 MG/0.6ML ~~LOC~~ SOLN
60.0000 mg | SUBCUTANEOUS | Status: DC
  Administered 2020-04-29 – 2020-05-01 (×3): 60 mg via SUBCUTANEOUS
  Filled 2020-04-29 (×3): qty 0.6

## 2020-04-29 MED ORDER — FUROSEMIDE 10 MG/ML IJ SOLN
INTRAMUSCULAR | Status: AC
  Filled 2020-04-29: qty 4

## 2020-04-29 MED ORDER — ONDANSETRON HCL 4 MG PO TABS: 4.0000 mg | ORAL_TABLET | Freq: Four times a day (QID) | ORAL | Status: DC | PRN

## 2020-04-29 NOTE — Plan of Care (Signed)
Patient seen and rounded on while in the ER. Presenting with lower abdominal cellulitis with localized skin excoriations and breakdown.  Wound nursing has been consulted and patient started on meropenem and vancomycin as well as fluconazole.  She is currently receiving radiation treatment for her breast cancer (started 03/26/2020 and scheduled to finish on 05/12/2020).  Previously had been on chemo which was interrupted from previous illnesses as well as poor tolerance and she was instead started on anastrozole. She is from home and her goal is to return there at discharge. She mostly uses a walker and wheelchair to get around at home and has help from some family she says. Will continue current abx course for now and quickly de-escalate as able.  Follow up [facility] assessment.

## 2020-04-29 NOTE — Patient Outreach (Addendum)
Care Coordination  04/29/2020  [name] 04-30-1956 [id number]  [place] Telephone Outreach Care Coordination re:  [name], 64 y/o female referred to this [facility] RN CM 04/17/20 by [facility] CM leadership from previously assigned [facility] RN CM for follow up on care coordination managed medicaid needs.  Patient had recent hospitalization July 5-23, 2021 for cellulitis/ skin breakdown; she was discharged home to self-care with home health services through [place].  Patient has history including, but not limited to, HTN; lymphedema; breast CA with (L) mastectomy in April 2021; pressure sores; morbid obesity; alcohol use/ abuse.  Upon review of EHR prior to attempting to contact patient this afternoon, noted that she has presented to ED for LE swelling; plans to admit patient as inpatient currently in progress.  Plan:  Will make [facility] CM Managed Medicaid team and [facility] RN CM hospital liaison team aware and will watch patient's progress around discharge disposition  Oneta Rack, RN, BSN, [place] Care Management  [phone]     ===View-only below this line=== ----- Message ----- From: Knox Royalty, RN Sent: 04/29/2020   2:33 PM EDT To: Maryelizabeth Rowan, RN, Milda Smart, RN Subject: Melton AlarJuluis Rainier- patient in ED; admission pending      Hi Claudie Leach  Please see message below for this MM patient; she is currently in ED with plans to admit.  Thanks,  Oneta Rack, RN, BSN, [place] [facility] Management  [phone]  ----- Message ----- From: Knox Royalty, RN Sent: 04/29/2020   2:22 PM EDT To: Clerance Lav, RN Subject: FYI- patient in ED; admission pending          Hi Alisa,  Loris..... thought I should let you know that patient is currently in ED with pending plans to admit IP status.... ongoing LE edema/ lymphedema  I was going to call patient his afternoon for a third  outreach attempt after Nolon Rod sent me an e-mail on 04/17/20 asking me to assume care from [place].  We have been unable to reach patient, her voice mail box is full, other care team members have experienced this as well (inability to contact patient)  I thought you would probably want to know about all of this, even given that your original referral was dated 03/24/20 for both [facility] RN CM and care guide (appears care guide DID successfully reach patient and assisted with transportation).    I'll be watching through Epic as best as possible now that we are no longer receiving ADT notifications-- know you are building the MM team as we speak, so wanted to make you aware of this, in case you wanted your team to go ahead and assume care-- if so, just let me know and I'll take my name off the care team, like we did for the other patient you messaged me about last week.  Other wise, I will continue outreach efforts as indicated around patient's current admission/ discharge disposition  Thanks, have a great afternoon  Oneta Rack, RN, BSN, [employer] Coordinator [facility] Care Management  [phone]

## 2020-04-29 NOTE — Progress Notes (Signed)
Pharmacy Antibiotic Note  [name] is a 64 y.o. female admitted on 04/29/2020 with cellulitis.  Pharmacy has been consulted for Meropenem & Vancomycin dosing. NCrCl 10ml/min Hx ampicillin-sensitive enterococcus bacteremia in May 2021  Plan: Meropenem 1gm IV q8h Vancomycin 2gm IV x1 then 1500mg  IV q24h to target trough ~15 Monitor renal function and cx data    Height: 5\' 1"  (154.9 cm) Weight: 136.1 kg (300 lb) IBW/kg (Calculated) : 47.8  Temp (24hrs), Avg:97.9 F (36.6 C), Min:97.8 F (36.6 C), Max:97.9 F (36.6 C)  Recent Labs  Lab 04/28/20 2341  WBC 4.5  CREATININE 1.20*    Estimated Creatinine Clearance: 62.1 mL/min (A) (by C-G formula based on SCr of 1.2 mg/dL (H)).    Allergies  Allergen Reactions  . Amlodipine Swelling    BLE edema    Antimicrobials this admission: 8/31 Vancomycin >>  8/31 Meropenem >>  8/31 Diflucan >>  Dose adjustments this admission:  Microbiology results: 8/31 BCx:   Thank you for allowing pharmacy to be a part of this patient's care.  Netta Cedars PharmD, BCPS 04/29/2020 6:52 AM

## 2020-04-29 NOTE — H&P (Signed)
History and Physical    [name] LFY:[id number] DOB: 1955-09-07 DOA: 04/29/2020  PCP: [name], MD  Patient coming from: Home   Chief Complaint:  Chief Complaint  Patient presents with  . Leg Swelling     HPI:    64 year old female with past medical history of left breast malignancy (T2N1 Grade 3 ER positive), chronic kidney disease stage III, lymphedema, venous insufficiency, status post left mastectomy 11/2019, obesity, hypertension who presents to [facility] emergency department with complaints of lower abdominal and bilateral lower extremity pain.  Patient explains that for the past 2 days she has been experiencing severe anterior abdominal wall, groin and bilateral lower extremity pain.  Patient describes the pain is severe in intensity, sharp in quality, worse with movement or weightbearing.  Patient attributes this severe pain to her bilateral lower extremity swelling however upon further questioning of the patient it seems the patient has been this woman for least the past several months.  Upon further questioning patient complains of associated generalized weakness and difficulty with ambulation primarily due to the severity of the pain.  Patient denies fevers or sick contacts.  Of note, patient was hospitalized with a similar presentation in July and was diagnosed at that time with abdominal cellulitis.  Patient also faced challenges during that hospitalization concerning significant breakdown of the folds of her abdominal walls, pannus and perineal area.  Patient continued to have substantial pain eventually prompting her presentation to [facility] emergency department for evaluation.  Upon evaluation in the emergency department due to patient severe pain and inability to ambulate the hospitalist group was called to assess the patient for admission to the hospital.  Review of Systems: Review of Systems  Musculoskeletal: Positive for myalgias.        Bilateral leg and thigh pain  Neurological: Positive for weakness.  All other systems reviewed and are negative.     Past Medical History:  Diagnosis Date  . Arthritis   . Cancer ([place]) 11/2019   Left breast  . Diverticulitis   . Family history of ovarian cancer   . Heart murmur    11/26/19 echo: Mild AS. AV mean gradient 12.0 mmHg; however, LVOT gradient 8 mmHg with intracvitary gradient-significant AS is not suspected  . Hypertension     Past Surgical History:  Procedure Laterality Date  . MASTECTOMY WITH AXILLARY LYMPH NODE DISSECTION Left 12/19/2019   Procedure: LEFT MASTECTOMY WITH AXILLARY LYMPH NODE DISSECTION;  Surgeon: [name], MD;  Location: [place];  Service: General;  Laterality: Left;  Marland Kitchen MULTIPLE EXTRACTIONS WITH ALVEOLOPLASTY Bilateral 12/14/2019   Procedure: MULTIPLE EXTRACTION WITH ALVEOLOPLASTY;  Surgeon: [name], DDS;  Location: [place];  Service: Oral Surgery;  Laterality: Bilateral;  . PORTACATH PLACEMENT N/A 12/19/2019   Procedure: INSERTION PORT-A-CATH WITH ULTRASOUND GUIDANCE;  Surgeon: [name], MD;  Location: [place];  Service: General;  Laterality: N/A;  . RADIAL HEAD ARTHROPLASTY Right 06/25/2019   Procedure: RIGHT RADIAL HEAD ARTHROPLASTY;  Surgeon: [name], MD;  Location: [place];  Service: Orthopedics;  Laterality: Right;  . TUBAL LIGATION       reports that she has never smoked. She has never used smokeless tobacco. She reports current alcohol use of about 3.0 standard drinks of alcohol per week. She reports current drug use. Drug: Marijuana.  Allergies  Allergen Reactions  . Amlodipine Swelling    BLE edema    Family History  Problem Relation Age of Onset  . Hypertension Mother   .  Dementia Mother   . Ovarian cancer Half-Sister 28  . Diabetes Half-Sister   . Stroke Half-Sister      Prior to Admission medications   Medication Sig Start Date End Date Taking? Authorizing Provider  acetaminophen (TYLENOL) 500 MG tablet  Take 500 mg by mouth as needed for mild pain.   Yes [provider]  aspirin EC 81 MG EC tablet Take 1 tablet (81 mg total) by mouth daily. Swallow whole. 02/14/20  Yes [name], MD  carvedilol (COREG) 12.5 MG tablet Take 1 tablet (12.5 mg total) by mouth 2 (two) times daily with a meal. 03/25/20  Yes Newlin, Enobong, MD  furosemide (LASIX) 20 MG tablet TAKE 1 TABLET BY MOUTH EVERY DAY Patient taking differently: Take 20 mg by mouth daily.  04/18/20  Yes Charlott Rakes, MD  hydrALAZINE (APRESOLINE) 25 MG tablet Take 1 tablet (25 mg total) by mouth every 8 (eight) hours. 03/25/20  Yes Charlott Rakes, MD  lidocaine-prilocaine (EMLA) cream Apply 1 application topically as needed. Patient taking differently: Apply 1 application topically as needed (port).  02/05/20  Yes Magrinat, Virgie Dad, MD  anastrozole (ARIMIDEX) 1 MG tablet Take 1 tablet (1 mg total) by mouth daily. 03/27/20   Magrinat, Virgie Dad, MD  Blood Pressure Monitor DEVI Use as instructed by provider to check bloos pressure daily. ICD: I10 02/18/20   Fulp, Ander Gaster, MD  folic acid (FOLVITE) 1 MG tablet Take 1 tablet (1 mg total) by mouth daily. 03/22/20   Eugenie Filler, MD    Physical Exam: Vitals:   04/29/20 0322 04/29/20 0500 04/29/20 0544 04/29/20 0600  BP: (!) 141/62 133/69 (!) 116/105 130/89  Pulse: (!) 102 99 88 (!) 104  Resp: 18 18 (!) 24 19  Temp: 97.9 F (36.6 C)     TempSrc:      SpO2: 100% 98% 99% 100%  Weight:      Height:         Constitutional: Acute alert and oriented x3, patient is in distress due to pain. Skin: Extensive breakdown with macerated skin of the folds of the abdomen and groin.  These areas are a mixture of hyperemic and purplish discolored skin with areas of shallow ulceration and foul smell with purulent drainage.  There are also notable scaly plaques scattered throughout the areas concerning for tinea.  There are extensive areas of hyperemia and induration of the anterior abdominal  wall and bilateral thighs.  Poor skin turgor otherwise. Eyes: Pupils are equally reactive to light.  No evidence of scleral icterus or conjunctival pallor.  ENMT: Moist mucous membranes noted.  Posterior pharynx clear of any exudate or lesions.   Neck: normal, supple, no masses, no thyromegaly.  No evidence of jugular venous distension.   Respiratory: clear to auscultation bilaterally, no wheezing, no crackles. Normal respiratory effort. No accessory muscle use.  Cardiovascular: Regular rate and rhythm, no murmurs / rubs / gallops. No extremity edema. 2+ pedal pulses. No carotid bruits.  Chest:   Nontender without crepitus or deformity.   Back:   Nontender without crepitus or deformity. Abdomen: Diffuse abdominal tenderness, abdomen soft however.  No evidence of intra-abdominal masses.  Positive bowel sounds noted in all quadrants.   Musculoskeletal: Diffuse bilateral lower extremity tenderness with skin examination findings as noted above. Good ROM, no contractures. Normal muscle tone.  Neurologic: CN 2-12 grossly intact. Sensation intact, patient is able to move all 4 extremities spontaneously patient is following all commands.  Patient is responsive to verbal  stimuli.   Psychiatric: Patient exhibits an anxious mood with appropriate affect.  Patient seems to possess insight as to theircurrent situation.     Labs on Admission: I have personally reviewed following labs and imaging studies -   CBC: Recent Labs  Lab 04/28/20 2341  WBC 4.5  HGB 9.4*  HCT 29.1*  MCV 100.3*  PLT 176   Basic Metabolic Panel: Recent Labs  Lab 04/28/20 2341  NA 142  K 3.8  CL 102  CO2 23  GLUCOSE 104*  BUN 12  CREATININE 1.20*  CALCIUM 8.3*   GFR: Estimated Creatinine Clearance: 62.1 mL/min (A) (by C-G formula based on SCr of 1.2 mg/dL (H)). Liver Function Tests: Recent Labs  Lab 04/28/20 2341  AST 64*  ALT 25  ALKPHOS 105  BILITOT 0.9  PROT 7.3  ALBUMIN 2.8*   Recent Labs  Lab  04/28/20 2341  LIPASE 29   No results for input(s): AMMONIA in the last 168 hours. Coagulation Profile: No results for input(s): INR, PROTIME in the last 168 hours. Cardiac Enzymes: No results for input(s): CKTOTAL, CKMB, CKMBINDEX, TROPONINI in the last 168 hours. BNP (last 3 results) No results for input(s): PROBNP in the last 8760 hours. HbA1C: No results for input(s): HGBA1C in the last 72 hours. CBG: No results for input(s): GLUCAP in the last 168 hours. Lipid Profile: No results for input(s): CHOL, HDL, LDLCALC, TRIG, CHOLHDL, LDLDIRECT in the last 72 hours. Thyroid Function Tests: No results for input(s): TSH, T4TOTAL, FREET4, T3FREE, THYROIDAB in the last 72 hours. Anemia Panel: No results for input(s): VITAMINB12, FOLATE, FERRITIN, TIBC, IRON, RETICCTPCT in the last 72 hours. Urine analysis:    Component Value Date/Time   COLORURINE YELLOW 04/29/2020 0614   APPEARANCEUR HAZY (A) 04/29/2020 0614   LABSPEC 1.006 04/29/2020 0614   PHURINE 5.0 04/29/2020 0614   GLUCOSEU NEGATIVE 04/29/2020 0614   HGBUR NEGATIVE 04/29/2020 0614   BILIRUBINUR NEGATIVE 04/29/2020 0614   KETONESUR NEGATIVE 04/29/2020 0614   PROTEINUR NEGATIVE 04/29/2020 0614   UROBILINOGEN 1.0 07/14/2012 1630   NITRITE NEGATIVE 04/29/2020 0614   LEUKOCYTESUR NEGATIVE 04/29/2020 1607    Radiological Exams on Admission - Personally Reviewed: DG Chest 2 View  Result Date: 04/29/2020 CLINICAL DATA:  Anasarca, upper outer quadrant left breast cancer with lymph node metastases EXAM: CHEST - 2 VIEW COMPARISON:  02/10/2020 FINDINGS: Frontal and lateral views of the chest demonstrates stable right chest wall port via subclavian approach, tip within the superior vena cava. The cardiac silhouette is enlarged but stable. No acute airspace disease, effusion, or pneumothorax. Postsurgical changes within the left breast and axilla. No acute or destructive bony lesions. IMPRESSION: 1. Stable exam, no acute process.  Electronically Signed   By: Randa Ngo M.D.   On: 04/29/2020 03:52     Assessment/Plan Principal Problem:   Cellulitis of abdominal wall   Patient exhibiting significant cellulitis of the anterior abdominal wall, especially areas surrounding the breakdown skin of the folds of the abdomen and groin.  The cellulitis extends down to the bilateral thighs with additional areas of hyperemia and induration.  All of these areas are profoundly tender, out of proportion with exam.  Multiple shallow ulcerations of the folds of the abdomen and groin with foul-smelling purulent drainage.  I have cultured some of this purulent drainage and sent to lab.  Placing patient on broad-spectrum intravenous antibiotic therapy with meropenem and vancomycin with additional antifungal therapy with intravenous fluconazole.  Considering these scaly plaques that seem to  be scattered throughout the folds of the abdomen, I believe there is a component of tinea involved.  Concerning the skin breakdown, will ask the wound care team to come evaluate and make recommendations on skin care for these complicated areas.  I do not believe that nystatin will help as nystatin is typically ineffective in treating tinea.  If patient does not rapidly improve we will consider infectious disease consultation.  Blood cultures obtained  Hydrating patient with intravenous isotonic fluids  As needed opiate-based analgesics for substantial associated pain.  Active Problems:   Cellulitis of thigh   Please see assessment and plan above    Essential hypertension   Continue home regimen of antihypertensive therapy    Malignant neoplasm of upper-outer quadrant of left breast in female, estrogen receptor positive ([place])   Chemotherapy regimen for the most part has been suspended since patient's extended hospitalization in July according to the patient and review of oncology notes.  Continuing home regimen of anastrozole  therapy  Continue outpatient follow-up    Tinea corporis   Please see assessment plan above    Anemia of chronic disease   Longstanding known history of anemia of chronic disease   No clinical evidence of bleeding  Monitoring hemoglobin and hematocrit with serial CBCs    Chronic kidney disease, stage 3a  Strict input output monitoring  Monitoring renal function electrolytes with serial chemistries   Code Status:  Full code Family Communication: deferred   Status is: Inpatient  Remains inpatient appropriate because:IV treatments appropriate due to intensity of illness or inability to take PO and Inpatient level of care appropriate due to severity of illness   Dispo: The patient is from: Home              Anticipated d/c is to: Home              Anticipated d/c date is: > 3 days              Patient currently is not medically stable to d/c.        Vernelle Emerald MD [facility] Pager [phone]  If 7PM-7AM, please contact night-coverage [email or web address] Use universal  password for that web site. If you do not have the password, please call the hospital operator.  04/29/2020, 7:28 AM

## 2020-04-29 NOTE — ED Provider Notes (Signed)
[facility] DEPT Provider Note   CSN: [id number] Arrival date & time: 04/28/20  2236     History Chief Complaint  Patient presents with  . Leg Swelling    [name] is a 64 y.o. female.  HPI     This is a 64 year old female with a history of hypertension, obesity, left-sided breast cancer who presents with lower extremity swelling.  Patient reports over the last several days she has had increasing lower extremity swelling that extends into her abdomen.  She states that it is severely impeded her ability to walk and she reports significant pain.  She denies any significant shortness of breath or chest pain.  No recent fevers.  She denies to me that she is taking any diuretic medications but she has Lasix listed on her medications.  She was started on anastrozole for her breast cancer but patient has not been able to get this filled and states that it was not at her pharmacy.  She rates her pain at 9 out of 10.  It is mostly in her bilateral lower extremities and lower abdomen.  No nausea, vomiting, diarrhea.  No fevers or noted sick contacts.  I reviewed her chart.  There are several nursing communications attempting to get the patient's antiestrogen filled; however, her mailbox has been full.  Further review of the CT abdomen pelvis on 03/07/2020 no evidence of malignancy in the abdomen.  Echocardiogram in June with a normal EF, 70 to 75%.  Last oncology visit 7/29/202.  Plan at that time was to continue anastrozole and allow for skin breakdown to improve.  She would continue a radiation treatments as well.  Past Medical History:  Diagnosis Date  . Arthritis   . Cancer ([place]) 11/2019   Left breast  . Diverticulitis   . Family history of ovarian cancer   . Heart murmur    11/26/19 echo: Mild AS. AV mean gradient 12.0 mmHg; however, LVOT gradient 8 mmHg with intracvitary gradient-significant AS is not suspected  . Hypertension     Patient Active  Problem List   Diagnosis Date Noted  . Cellulitis of abdominal wall   . Constipation   . Folate deficiency   . Hypomagnesemia   . Debility   . Gout 03/03/2020  . Skin breakdown 03/03/2020  . Skin ulcer of perineum, limited to breakdown of skin ([place]) 03/03/2020  . Leukocytosis 03/03/2020  . Anemia associated with chemotherapy 03/03/2020  . Thrombocytopenia ([place]) 03/03/2020  . Port-A-Cath in place 02/19/2020  . Acute renal failure (ARF) ([place]) 01/22/2020  . S/P left mastectomy 12/19/2019  . Genetic testing 11/30/2019  . Morbid obesity with BMI of 60.0-69.9, adult ([place]) 11/21/2019  . Family history of ovarian cancer   . Malignant neoplasm of upper-outer quadrant of left breast in female, estrogen receptor positive ([place]) 11/14/2019  . Fracture of radial head, right, closed 06/25/2019  . Frequent falls 01/10/2019  . Bilateral bunions 01/10/2019  . Toenail fungus 01/10/2019  . Alcoholic hepatitis 53/97/6734  . Edema 11/20/2018  . Alcohol-induced mood disorder ([place]) 11/01/2013  . Alcohol dependence ([place]) 11/01/2013  . Essential hypertension 06/20/2007  . LOW BACK PAIN 06/20/2007  . DIVERTICULOSIS, COLON 05/05/2007    Past Surgical History:  Procedure Laterality Date  . MASTECTOMY WITH AXILLARY LYMPH NODE DISSECTION Left 12/19/2019   Procedure: LEFT MASTECTOMY WITH AXILLARY LYMPH NODE DISSECTION;  Surgeon: [name], MD;  Location: [facility];  Service: General;  Laterality: Left;  Marland Kitchen MULTIPLE EXTRACTIONS WITH ALVEOLOPLASTY Bilateral  12/14/2019   Procedure: MULTIPLE EXTRACTION WITH ALVEOLOPLASTY;  Surgeon: [name], DDS;  Location: [facility];  Service: Oral Surgery;  Laterality: Bilateral;  . PORTACATH PLACEMENT N/A 12/19/2019   Procedure: INSERTION PORT-A-CATH WITH ULTRASOUND GUIDANCE;  Surgeon: [name], MD;  Location: [facility];  Service: General;  Laterality: N/A;  . RADIAL HEAD ARTHROPLASTY Right 06/25/2019   Procedure: RIGHT RADIAL HEAD ARTHROPLASTY;  Surgeon: [name], MD;  Location: [facility];  Service: Orthopedics;  Laterality: Right;  . TUBAL LIGATION       OB History   No obstetric history on file.     Family History  Problem Relation Age of Onset  . Hypertension Mother   . Dementia Mother   . Ovarian cancer Half-Sister 95  . Diabetes Half-Sister   . Stroke Half-Sister     Social History   Tobacco Use  . Smoking status: Never Smoker  . Smokeless tobacco: Never Used  Vaping Use  . Vaping Use: Never used  Substance Use Topics  . Alcohol use: Yes    Alcohol/week: 3.0 standard drinks    Types: 1 Cans of beer, 2 Shots of liquor per week    Comment: daily  . Drug use: Yes    Types: Marijuana    Home Medications Prior to Admission medications   Medication Sig Start Date End Date Taking? Authorizing Provider  acetaminophen (TYLENOL) 500 MG tablet Take 500 mg by mouth as needed for mild pain.    [provider]  anastrozole (ARIMIDEX) 1 MG tablet Take 1 tablet (1 mg total) by mouth daily. 03/27/20   Magrinat, Virgie Dad, MD  aspirin EC 81 MG EC tablet Take 1 tablet (81 mg total) by mouth daily. Swallow whole. 02/14/20   [name], MD  Blood Pressure Monitor DEVI Use as instructed by provider to check bloos pressure daily. ICD: I10 02/18/20   Fulp, Ander Gaster, MD  carvedilol (COREG) 12.5 MG tablet Take 1 tablet (12.5 mg total) by mouth 2 (two) times daily with a meal. 03/25/20   Charlott Rakes, MD  folic acid (FOLVITE) 1 MG tablet Take 1 tablet (1 mg total) by mouth daily. 03/22/20   Eugenie Filler, MD  furosemide (LASIX) 20 MG tablet TAKE 1 TABLET BY MOUTH EVERY DAY 04/18/20   Charlott Rakes, MD  hydrALAZINE (APRESOLINE) 25 MG tablet Take 1 tablet (25 mg total) by mouth every 8 (eight) hours. 03/25/20   Charlott Rakes, MD  lidocaine-prilocaine (EMLA) cream Apply 1 application topically as needed. 02/05/20   Magrinat, Virgie Dad, MD    Allergies    Amlodipine  Review of Systems   Review of Systems  Constitutional: Negative  for fever.  Respiratory: Negative for cough and shortness of breath.   Cardiovascular: Positive for leg swelling. Negative for chest pain.  Gastrointestinal: Positive for abdominal pain. Negative for constipation, diarrhea, nausea and vomiting.  Genitourinary: Negative for dysuria.  All other systems reviewed and are negative.   Physical Exam Updated Vital Signs BP (!) 141/62   Pulse (!) 102   Temp 97.9 F (36.6 C)   Resp 18   Ht 1.549 m (5\' 1" )   Wt 136.1 kg   SpO2 100%   BMI 56.68 kg/m   Physical Exam Vitals and nursing note reviewed.  Constitutional:      Appearance: She is well-developed. She is obese. She is not ill-appearing.  HENT:     Head: Normocephalic and atraumatic.     Nose: Nose normal.  Mouth/Throat:     Mouth: Mucous membranes are moist.  Eyes:     Pupils: Pupils are equal, round, and reactive to light.  Cardiovascular:     Rate and Rhythm: Normal rate and regular rhythm.     Heart sounds: Normal heart sounds.  Pulmonary:     Effort: Pulmonary effort is normal. No respiratory distress.     Breath sounds: No wheezing.  Abdominal:     General: Bowel sounds are normal.     Palpations: Abdomen is soft.     Tenderness: There is abdominal tenderness.     Comments: Diffuse tenderness to palpation without rebound or guarding, exam limited as patient is in the hallway, anasarca noted across the lower abdomen with darkening of the skin  Musculoskeletal:     Cervical back: Neck supple.     Right lower leg: Edema present.     Left lower leg: Edema present.     Comments: Anasarca and pitting the bilateral lower extremities  Skin:    General: Skin is warm and dry.     Comments: Skin breakdown noted bilateral inner thighs  Neurological:     Mental Status: She is alert and oriented to person, place, and time.  Psychiatric:        Mood and Affect: Mood normal.     ED Results / Procedures / Treatments   Labs (all labs ordered are listed, but only abnormal  results are displayed) Labs Reviewed  BASIC METABOLIC PANEL - Abnormal; Notable for the following components:      Result Value   Glucose, Bld 104 (*)    Creatinine, Ser 1.20 (*)    Calcium 8.3 (*)    GFR calc non Af Amer 48 (*)    GFR calc Af Amer 55 (*)    Anion gap 17 (*)    All other components within normal limits  CBC - Abnormal; Notable for the following components:   RBC 2.90 (*)    Hemoglobin 9.4 (*)    HCT 29.1 (*)    MCV 100.3 (*)    RDW 18.8 (*)    All other components within normal limits  BRAIN NATRIURETIC PEPTIDE - Abnormal; Notable for the following components:   B Natriuretic Peptide 214.4 (*)    All other components within normal limits  HEPATIC FUNCTION PANEL - Abnormal; Notable for the following components:   Albumin 2.8 (*)    AST 64 (*)    Bilirubin, Direct 0.3 (*)    All other components within normal limits  SARS CORONAVIRUS 2 BY RT PCR (HOSPITAL ORDER, [facility])  LIPASE, BLOOD  URINALYSIS, ROUTINE W REFLEX MICROSCOPIC    EKG None  Radiology DG Chest 2 View  Result Date: 04/29/2020 CLINICAL DATA:  Anasarca, upper outer quadrant left breast cancer with lymph node metastases EXAM: CHEST - 2 VIEW COMPARISON:  02/10/2020 FINDINGS: Frontal and lateral views of the chest demonstrates stable right chest wall port via subclavian approach, tip within the superior vena cava. The cardiac silhouette is enlarged but stable. No acute airspace disease, effusion, or pneumothorax. Postsurgical changes within the left breast and axilla. No acute or destructive bony lesions. IMPRESSION: 1. Stable exam, no acute process. Electronically Signed   By: Randa Ngo M.D.   On: 04/29/2020 03:52    Procedures Procedures (including critical care time)  Medications Ordered in ED Medications  furosemide (LASIX) 10 MG/ML injection (  Not Given 04/29/20 0421)  furosemide (LASIX) injection 40  mg (40 mg Intravenous Given 04/29/20 0408)    ED Course    I have reviewed the triage vital signs and the nursing notes.  Pertinent labs & imaging results that were available during my care of the patient were reviewed by me and considered in my medical decision making (see chart for details).    MDM Rules/Calculators/A&P                           Patient presents with worsening lower extremity edema.  She is chronically ill-appearing and obese but overall nontoxic.  She is afebrile.  Vital signs notable for blood pressure of 141/62 and a pulse rate of 102.  She has evidence of anasarca to the lower abdomen.  There continues to be some skin breakdown in the medial thighs.  No significant erythema of the lower extremities to suggest cellulitis.  No leukocytosis or fever to suggest sepsis.  Etiology of anasarca is broad.  Recent normal echocardiogram.  Doubt heart failure.  She has no respiratory complaints.  Malignancy, lymphedema, venous obstruction, nephrotic syndrome are all considerations.  Creatinine slightly trended upward to 1.2.  Otherwise metabolites are reassuring.  Albumin is low at 2.8 but has trended upwards.  BNP is 214.  Have added on a urinalysis.  Unfortunately, patient is unable to ambulate independently and will need admission for further work-up of anasarca and PT/OT.  Patient was given 40 mg of Lasix in an attempt to diurese.  Patient discussed with admitting hospitalist.  Final Clinical Impression(s) / ED Diagnoses Final diagnoses:  Anasarca  Leg swelling    Rx / DC Orders ED Discharge Orders    None       Azlin Zilberman, Barbette Hair, MD 04/29/20 0425

## 2020-04-29 NOTE — ED Notes (Signed)
Patient repositioned in bed for comfort. Patient reports increased comfort

## 2020-04-29 NOTE — Consult Note (Addendum)
WOC consult requested for abd/pannus skin folds.   Pt is familiar to our team from recent admission on 7/21 Location is greatly improved since previous progress notes. Skin folds to abd are red, macerated, and moist with patchy areas of partial thickness skin loss. Appearance is consistent with intertrigo.  Interdry silver-impregnated fabric ordered for use by bedside nurses and instructions provided.  This product should remain in place for 5 days for optimal plan of care to provide antimicrobial benefits and wick moisture away from skin.  Please re-consult if further assistance is needed.  Thank-you,  Julien Girt MSN, [place], [place], [place], [place]

## 2020-04-29 NOTE — ED Notes (Signed)
Patient transported to X-ray 

## 2020-04-30 ENCOUNTER — Other Ambulatory Visit: Payer: Self-pay | Admitting: *Deleted

## 2020-04-30 ENCOUNTER — Ambulatory Visit
Admission: RE | Admit: 2020-04-30 | Discharge: 2020-04-30 | Disposition: A | Discharge status: Home / Self Care | Payer: Medicaid Other | Source: Ambulatory Visit | Attending: Radiation Oncology | Admitting: Radiation Oncology

## 2020-04-30 DIAGNOSIS — Z170 Estrogen receptor positive status [ER+]: Secondary | ICD-10-CM | POA: Insufficient documentation

## 2020-04-30 DIAGNOSIS — C50412 Malignant neoplasm of upper-outer quadrant of left female breast: Secondary | ICD-10-CM | POA: Insufficient documentation

## 2020-04-30 DIAGNOSIS — Z510 Encounter for antineoplastic radiation therapy: Secondary | ICD-10-CM | POA: Insufficient documentation

## 2020-04-30 LAB — COMPREHENSIVE METABOLIC PANEL
ALT: 24 U/L (ref 0–44)
AST: 49 U/L — ABNORMAL HIGH (ref 15–41)
Albumin: 2.6 g/dL — ABNORMAL LOW (ref 3.5–5.0)
Alkaline Phosphatase: 94 U/L (ref 38–126)
Anion gap: 12 (ref 5–15)
BUN: 14 mg/dL (ref 8–23)
CO2: 26 mmol/L (ref 22–32)
Calcium: 7.9 mg/dL — ABNORMAL LOW (ref 8.9–10.3)
Chloride: 103 mmol/L (ref 98–111)
Creatinine, Ser: 1.07 mg/dL — ABNORMAL HIGH (ref 0.44–1.00)
GFR calc Af Amer: >60 mL/min (ref >60)
GFR calc non Af Amer: 55 mL/min — ABNORMAL LOW (ref >60)
Glucose, Bld: 96 mg/dL (ref 70–99)
Potassium: 3.9 mmol/L (ref 3.5–5.1)
Sodium: 141 mmol/L (ref 135–145)
Total Bilirubin: 1.4 mg/dL — ABNORMAL HIGH (ref 0.3–1.2)
Total Protein: 7.1 g/dL (ref 6.5–8.1)

## 2020-04-30 LAB — CBC WITH DIFFERENTIAL/PLATELET
Abs Immature Granulocytes: 0.03 10*3/uL (ref 0.00–0.07)
Basophils Absolute: 0 10*3/uL (ref 0.0–0.1)
Basophils Relative: 0 %
Eosinophils Absolute: 0.1 10*3/uL (ref 0.0–0.5)
Eosinophils Relative: 3 %
HCT: 26.9 % — ABNORMAL LOW (ref 36.0–46.0)
Hemoglobin: 8.7 g/dL — ABNORMAL LOW (ref 12.0–15.0)
Immature Granulocytes: 1 %
Lymphocytes Relative: 18 %
Lymphs Abs: 0.9 10*3/uL (ref 0.7–4.0)
MCH: 33 pg (ref 26.0–34.0)
MCHC: 32.3 g/dL (ref 30.0–36.0)
MCV: 101.9 fL — ABNORMAL HIGH (ref 80.0–100.0)
Monocytes Absolute: 0.8 10*3/uL (ref 0.1–1.0)
Monocytes Relative: 14 %
Neutro Abs: 3.4 10*3/uL (ref 1.7–7.7)
Neutrophils Relative %: 64 %
Platelets: 141 10*3/uL — ABNORMAL LOW (ref 150–400)
RBC: 2.64 MIL/uL — ABNORMAL LOW (ref 3.87–5.11)
RDW: 18.9 % — ABNORMAL HIGH (ref 11.5–15.5)
WBC: 5.3 10*3/uL (ref 4.0–10.5)
nRBC: 0 % (ref 0.0–0.2)

## 2020-04-30 LAB — BLOOD CULTURE ID PANEL (REFLEXED) - BCID2

## 2020-04-30 LAB — MAGNESIUM: Magnesium: 1.1 mg/dL — ABNORMAL LOW (ref 1.7–2.4)

## 2020-04-30 MED ORDER — SODIUM CHLORIDE 0.9 % IV SOLN
2.0000 g | INTRAVENOUS | Status: DC
  Administered 2020-04-30: 2 g via INTRAVENOUS
  Filled 2020-04-30: qty 20
  Filled 2020-04-30: qty 2

## 2020-04-30 MED ORDER — MAGNESIUM SULFATE 4 GM/100ML IV SOLN
4.0000 g | Freq: Once | INTRAVENOUS | Status: AC
  Administered 2020-04-30: 4 g via INTRAVENOUS
  Filled 2020-04-30: qty 100

## 2020-04-30 NOTE — Assessment & Plan Note (Addendum)
-  Initially on vancomycin, meropenem, fluconazole.  De-escalate down to Vanco and Rocephin for now.  Will further de-escalate to oral route at discharge -Evaluated by [facility] RN.  Continue wound recommendations -De-escalate to Keflex to complete course at discharge

## 2020-04-30 NOTE — TOC Progression Note (Signed)
Transition of Care [facility]) - Progression Note    Patient Details  Name: [name] MRN: [id number] Date of Birth: [birth year]  [facility]) CM/SW Contact  Shelbey Spindler, Juliann Pulse, RN Phone Number: 04/30/2020, 1:31 PM  Clinical Narrative:Spoke to patient about d/c plans-she already has private duty set up(custodial level)-she will call @ d/c. She uses [employer] for transprot-she will call 2 d/c for her own transport home. TC [employer] rep debbie-w/c,3n1 already ordered from past hospitalization-[employer] is out of stock on w/c, & 3n1 on order-they will continue to f/u with patient & call once items in. [facility] also following.No further CM needs.            Expected Discharge Plan and Services                                                 Social Determinants of Health (SDOH) Interventions    Readmission Risk Interventions Readmission Risk Prevention Plan 03/06/2020 02/13/2020  Transportation Screening Complete Complete  PCP or Specialist Appt within 3-5 Days Not Complete -  Not Complete comments Unsure of dc date at this time -  [place] or [place] Complete -  Social Work Consult for [place] Planning/Counseling Complete -  Palliative Care Screening Not Applicable -  Medication Review Press photographer) Complete Complete  PCP or Specialist appointment within 3-5 days of discharge - Complete  [place] or [place] - Complete  SW Recovery Care/Counseling Consult - Complete  [place] - Not Applicable  Some recent data might be hidden

## 2020-04-30 NOTE — Consult Note (Signed)
   [facility] CM Inpatient Consult   04/30/2020  Diavian Furgason 1955/10/02 [id number]   Patient screened for high risk score for unplanned readmission. [employer] broadcast assistant referral received in July 2021 for care coordination managed medicaid needs. Notified by [facility] CM care manager that attempts to speak with patient have been unsuccessful to date.   Will continue to follow for progression and disposition.   Of note, [facility] Care Management services does not replace or interfere with any services that are arranged by inpatient case management or social work.  Netta Cedars, MSN, [facility] Liaison Nurse Mobile Phone [phone]  Toll free office [phone]

## 2020-04-30 NOTE — Patient Outreach (Signed)
Care Coordination  04/30/2020  Roshana Shuffield 07-Jan-1956 [id number]  [facility] CM Telephone Outreach Care Coordination re:  [name], 64 y/o femalereferred to this [facility] RN CM 04/17/20 by [place] leadership from previously assigned [facility] RN CM for follow up on care coordination managed medicaid needs.  Patient had recent hospitalization July 5-23, 2021 for cellulitis/ skin breakdown; she was discharged home to self-care with home health services through [place].Patient has history including, but not limited to, HTN; lymphedema; breast CA with (L) mastectomy in April 2021; pressure sores; morbid obesity; alcohol use/ abuse.  Upon review of EHR prior to attempting to contact patient yesterday, noted that she has presented to ED for LE swelling; patient is currently admitted.  Care coordination outreach was made to Managed Medicaid team as well as to [facility] liaison team to notify of patient's hospital admission.  Received confirmation from [facility] team that the [place] Medicaid team will assume as primary care coordination team moving forward.  Made [facility] liaison team aware of same.  Plan:  Will remove myself from care team, as [facility] CM Managed Medicaid team is now following patient's progress  Oneta Rack, RN, BSN, [place] Care Management  [phone]    ===View-only below this line=== ----- Message ----- From: Knox Royalty, RN Sent: 04/30/2020  12:08 PM EDT To: Maryelizabeth Rowan, RN, Milda Smart, RN, * Subject: RE: [name]- patient in ED; admission pending      Thanks Alisa; I have added the [facility] liaisons so they are aware and can send you updates around patient's discharge disposition.  I have removed myself from the care team.  Richarda Osmond  ----- Message ----- From: Clerance Lav, RN Sent: 04/30/2020   6:55 AM EDT To: Knox Royalty, RN Subject: RE: [name]- patient in ED;  admission pending      Hi Konnor Vondrasek,   Yes ma'm. We'll take this patient on. I'll schedule her for one of the nurse care managers for next week.   Thank you for the heads up! Delrae Rend

## 2020-04-30 NOTE — Assessment & Plan Note (Signed)
-  Baseline hemoglobin 9 to 10 g/dL -Currently stable and at baseline

## 2020-04-30 NOTE — Progress Notes (Signed)
PROGRESS NOTE    [name]   SWF:[id number]  DOB: 05/24/1956  DOA: 04/29/2020     1  PCP: [name], MD  CC: abdominal pain  Hospital Course: [name] is a 64 yo female with PMH left breast cancer (currently undergoing radiation), HTN who presented with lower abdominal pain. She has been hospitalized for similar recently with abdominal wall cellulitis and has been followed closely by wound care.  There was concern for recurrent severe abdominal wall cellulitis and wound care nursing was consulted.  She was felt to have intertrigo with skin excoriations which was likely contributing to her pain. She was recommended to have Interdry silver impregnated fabric for approximately 5 days and to pursue keeping the skin dry. She was started on vancomycin, meropenem, fluconazole on admission.  This was deescalated to vancomycin and Rocephin after approximately 2 days.  She is currently receiving radiation treatment for her breast cancer (started 03/26/2020 and scheduled to finish on 05/12/2020).  Previously had been on chemo which was interrupted from previous illnesses as well as poor tolerance and she was instead started on anastrozole. She is from home and her goal is to return there at discharge. She mostly uses a walker and wheelchair to get around at home and has help from some family she says.   Interval History:  No events overnight.  Resting in bed comfortably this morning in no distress.  Abdomen feels a little better compared to yesterday.  Old records reviewed in assessment of this patient  ROS: Constitutional: negative for chills and fevers, Cardiovascular: negative for chest pain, Gastrointestinal: negative for abdominal pain and Genitourinary:negative for dysuria  Assessment & Plan: Malignant neoplasm of upper-outer quadrant of left breast in female, estrogen receptor positive ([place]) - continue outpatient treatment with oncology  -Continue anastrozole  Essential  hypertension -Continue Coreg and hydralazine  Cellulitis of abdominal wall -Initially on vancomycin, meropenem, fluconazole.  De-escalate down to Vanco and Rocephin for now.  Will further de-escalate to oral route at discharge -Evaluated by [facility] RN.  Continue wound recommendations -Likely will be able to go home tomorrow  Anemia of chronic disease -Baseline hemoglobin 9 to 10 g/dL -Currently stable and at baseline  Chronic kidney disease, stage 3a -Renal function currently at baseline.  Avoid nephrotoxic agents as able   Antimicrobials: Fluconazole 8/31/2021x1 Meropenem 04/29/2020 - 9/1 Vancomycin 04/29/2020 >> present Rocephin 04/30/2020>> present  DVT prophylaxis: Lovenox Code Status: Full Family Communication: None present Disposition Plan: Status is: Inpatient  Remains inpatient appropriate because:IV treatments appropriate due to intensity of illness or inability to take PO and Inpatient level of care appropriate due to severity of illness   Dispo: The patient is from: Home              Anticipated d/c is to: Home              Anticipated d/c date is: 1 day              Patient currently is not medically stable to d/c.  Objective: Blood pressure 112/62, pulse 78, temperature 98.1 F (36.7 C), resp. rate 20, height 5\' 1"  (1.549 m), weight 136.1 kg, SpO2 98 %.  Examination: General appearance: alert, cooperative and no distress Head: Normocephalic, without obvious abnormality, atraumatic Eyes: EOMI Lungs: clear to auscultation bilaterally Heart: regular rate and rhythm and S1, S2 normal Abdomen: Obese, soft, nontender, nondistended, bowel sounds present and distant; large pannus appreciated with indurated skin.  Underneath pannus skin fold, there is  skin excoriation and significant moisture Extremities: trace LE edema Skin: mobility and turgor normal Neurologic: Grossly normal  Consultants:   none  Procedures:   none  Data Reviewed: I have personally reviewed  following labs and imaging studies Results for orders placed or performed during the hospital encounter of 04/29/20 (from the past 24 hour(s))  Magnesium     Status: Abnormal   Collection Time: 04/30/20  3:36 AM  Result Value Ref Range   Magnesium 1.1 (L) 1.7 - 2.4 mg/dL  CBC WITH DIFFERENTIAL     Status: Abnormal   Collection Time: 04/30/20  3:36 AM  Result Value Ref Range   WBC 5.3 4.0 - 10.5 K/uL   RBC 2.64 (L) 3.87 - 5.11 MIL/uL   Hemoglobin 8.7 (L) 12.0 - 15.0 g/dL   HCT 26.9 (L) 36 - 46 %   MCV 101.9 (H) 80.0 - 100.0 fL   MCH 33.0 26.0 - 34.0 pg   MCHC 32.3 30.0 - 36.0 g/dL   RDW 18.9 (H) 11.5 - 15.5 %   Platelets 141 (L) 150 - 400 K/uL   nRBC 0.0 0.0 - 0.2 %   Neutrophils Relative % 64 %   Neutro Abs 3.4 1.7 - 7.7 K/uL   Lymphocytes Relative 18 %   Lymphs Abs 0.9 0.7 - 4.0 K/uL   Monocytes Relative 14 %   Monocytes Absolute 0.8 0 - 1 K/uL   Eosinophils Relative 3 %   Eosinophils Absolute 0.1 0 - 0 K/uL   Basophils Relative 0 %   Basophils Absolute 0.0 0 - 0 K/uL   Immature Granulocytes 1 %   Abs Immature Granulocytes 0.03 0.00 - 0.07 K/uL  Comprehensive metabolic panel     Status: Abnormal   Collection Time: 04/30/20  3:36 AM  Result Value Ref Range   Sodium 141 135 - 145 mmol/L   Potassium 3.9 3.5 - 5.1 mmol/L   Chloride 103 98 - 111 mmol/L   CO2 26 22 - 32 mmol/L   Glucose, Bld 96 70 - 99 mg/dL   BUN 14 8 - 23 mg/dL   Creatinine, Ser 1.07 (H) 0.44 - 1.00 mg/dL   Calcium 7.9 (L) 8.9 - 10.3 mg/dL   Total Protein 7.1 6.5 - 8.1 g/dL   Albumin 2.6 (L) 3.5 - 5.0 g/dL   AST 49 (H) 15 - 41 U/L   ALT 24 0 - 44 U/L   Alkaline Phosphatase 94 38 - 126 U/L   Total Bilirubin 1.4 (H) 0.3 - 1.2 mg/dL   GFR calc non Af Amer 55 (L) >60 mL/min   GFR calc Af Amer >60 >60 mL/min   Anion gap 12 5 - 15    Recent Results (from the past 240 hour(s))  SARS Coronavirus 2 by RT PCR (hospital order, performed in [place] hospital lab) Nasopharyngeal Nasopharyngeal Swab     Status:  None   Collection Time: 04/29/20  4:13 AM   Specimen: Nasopharyngeal Swab  Result Value Ref Range Status   SARS Coronavirus 2 NEGATIVE NEGATIVE Final    Comment: (NOTE) SARS-CoV-2 target nucleic acids are NOT DETECTED.  The SARS-CoV-2 RNA is generally detectable in upper and lower respiratory specimens during the acute phase of infection. The lowest concentration of SARS-CoV-2 viral copies this assay can detect is 250 copies / mL. A negative result does not preclude SARS-CoV-2 infection and should not be used as the sole basis for treatment or other patient management decisions.  A negative result may occur  with improper specimen collection / handling, submission of specimen other than nasopharyngeal swab, presence of viral mutation(s) within the areas targeted by this assay, and inadequate number of viral copies (<250 copies / mL). A negative result must be combined with clinical observations, patient history, and epidemiological information.  Fact Sheet for Patients:   StrictlyIdeas.no  Fact Sheet for Healthcare Providers: BankingDealers.co.za  This test is not yet approved or  cleared by the [place] FDA and has been authorized for detection and/or diagnosis of SARS-CoV-2 by FDA under an Emergency Use Authorization (EUA).  This EUA will remain in effect (meaning this test can be used) for the duration of the COVID-19 declaration under Section 564(b)(1) of the Act, 21 U.S.C. section 360bbb-3(b)(1), unless the authorization is terminated or revoked sooner.  Performed at [facility], [place] [address] [place], [place] [address]   Aerobic Culture (superficial specimen)     Status: None (Preliminary result)   Collection Time: 04/29/20  6:24 AM   Specimen: Abdomen  Result Value Ref Range Status   Specimen Description   Final    ABDOMEN Performed at [facility] [address]  [place], [place] [address]    Special Requests   Final    NONE Performed at [facility], [place] [address] [place], [place] [address]    Gram Stain   Final    FEW WBC PRESENT, PREDOMINANTLY PMN ABUNDANT GRAM NEGATIVE RODS ABUNDANT GRAM POSITIVE COCCI IN PAIRS IN CLUSTERS RARE GRAM POSITIVE RODS    Culture   Final    CULTURE REINCUBATED FOR BETTER GROWTH Performed at [facility], [place] [address], [place], [place] [address]    Report Status PENDING  Incomplete  Culture, blood (routine x 2)     Status: None (Preliminary result)   Collection Time: 04/29/20  6:43 AM   Specimen: BLOOD  Result Value Ref Range Status   Specimen Description   Final    BLOOD RIGHT ANTECUBITAL Performed at [place] [address] [place], [place] [address]    Special Requests   Final    BOTTLES DRAWN AEROBIC AND ANAEROBIC Blood Culture adequate volume Performed at [place] [address] [place], [place] [address]    Culture   Final    NO GROWTH < 24 HOURS Performed at [facility] [place] [address], [place], [place] [address]    Report Status PENDING  Incomplete  Culture, blood (routine x 2)     Status: None (Preliminary result)   Collection Time: 04/29/20  6:49 AM   Specimen: BLOOD  Result Value Ref Range Status   Specimen Description   Final    BLOOD PORTA CATH Performed at [facility] [address] [place], [place] [address]    Special Requests   Final    BOTTLES DRAWN AEROBIC AND ANAEROBIC Blood Culture adequate volume Performed at [facility] [address], [place], [place] [address]    Culture  Setup Time   Final    GRAM POSITIVE COCCI IN CLUSTERS AEROBIC BOTTLE ONLY CRITICAL RESULT CALLED TO, READ BACK BY AND VERIFIED WITH: Shelby Mattocks [phone] FCP Performed at [facility], [place] [address] [place],  [address]    Culture [facility] POSITIVE COCCI  Final    Report Status PENDING  Incomplete  Blood Culture ID Panel (Reflexed)     Status: Abnormal   Collection Time: 04/29/20  6:49 AM  Result Value Ref Range Status   Enterococcus faecalis NOT DETECTED NOT DETECTED Final   Enterococcus Faecium NOT DETECTED NOT DETECTED Final   Listeria monocytogenes NOT DETECTED NOT DETECTED Final   Staphylococcus species DETECTED (A) NOT DETECTED Final    Comment: CRITICAL RESULT CALLED TO, READ BACK BY AND VERIFIED WITH: PHARMD Alvira Philips [phone] FCP    Staphylococcus aureus (BCID) NOT DETECTED NOT DETECTED Final   Staphylococcus epidermidis DETECTED (A) NOT DETECTED Final    Comment: Methicillin (oxacillin) resistant coagulase negative staphylococcus. Possible blood culture contaminant (unless isolated from more than one blood culture draw or clinical case suggests pathogenicity). No antibiotic treatment is indicated for blood  culture contaminants. CRITICAL RESULT CALLED TO, READ BACK BY AND VERIFIED WITH: [place] [phone] FCP    Staphylococcus lugdunensis NOT DETECTED NOT DETECTED Final   Streptococcus species NOT DETECTED NOT DETECTED Final   Streptococcus agalactiae NOT DETECTED NOT DETECTED Final   Streptococcus pneumoniae NOT DETECTED NOT DETECTED Final   Streptococcus pyogenes NOT DETECTED NOT DETECTED Final   A.calcoaceticus-baumannii NOT DETECTED NOT DETECTED Final   Bacteroides fragilis NOT DETECTED NOT DETECTED Final   Enterobacterales NOT DETECTED NOT DETECTED Final   Enterobacter cloacae complex NOT DETECTED NOT DETECTED Final   Escherichia coli NOT DETECTED NOT DETECTED Final   Klebsiella aerogenes NOT DETECTED NOT DETECTED Final   Klebsiella oxytoca NOT DETECTED NOT DETECTED Final   Klebsiella pneumoniae NOT DETECTED NOT DETECTED Final   Proteus species NOT DETECTED NOT DETECTED Final   Salmonella species NOT DETECTED NOT DETECTED Final   Serratia marcescens NOT DETECTED NOT DETECTED Final   Haemophilus influenzae  NOT DETECTED NOT DETECTED Final   Neisseria meningitidis NOT DETECTED NOT DETECTED Final   Pseudomonas aeruginosa NOT DETECTED NOT DETECTED Final   Stenotrophomonas maltophilia NOT DETECTED NOT DETECTED Final   Candida albicans NOT DETECTED NOT DETECTED Final   Candida auris NOT DETECTED NOT DETECTED Final   Candida glabrata NOT DETECTED NOT DETECTED Final   Candida krusei NOT DETECTED NOT DETECTED Final   Candida parapsilosis NOT DETECTED NOT DETECTED Final   Candida tropicalis NOT DETECTED NOT DETECTED Final   Cryptococcus neoformans/gattii NOT DETECTED NOT DETECTED Final   Methicillin resistance mecA/C DETECTED (A) NOT DETECTED Final    Comment: CRITICAL RESULT CALLED TO, READ BACK BY AND VERIFIED WITH: Shelby Mattocks [phone] FCP Performed at [facility], [address], [place], [place] [address]      Radiology Studies: DG Chest 2 View  Result Date: 04/29/2020 CLINICAL DATA:  Anasarca, upper outer quadrant left breast cancer with lymph node metastases EXAM: CHEST - 2 VIEW COMPARISON:  02/10/2020 FINDINGS: Frontal and lateral views of the chest demonstrates stable right chest wall port via subclavian approach, tip within the superior vena cava. The cardiac silhouette is enlarged but stable. No acute airspace disease, effusion, or pneumothorax. Postsurgical changes within the left breast and axilla. No acute or destructive bony lesions. IMPRESSION: 1. Stable exam, no acute process. Electronically Signed   By: Randa Ngo M.D.   On: 04/29/2020 03:52   DG Chest 2 View  Final Result      Scheduled Meds: . anastrozole  1 mg Oral Daily  . aspirin EC  81 mg Oral Daily  . carvedilol  12.5 mg Oral BID WC  . Chlorhexidine Gluconate Cloth  6 each Topical q morning - 10a  . enoxaparin (LOVENOX) injection  60 mg Subcutaneous Q24H  .  folic acid  1 mg Oral Daily  . hydrALAZINE  25 mg Oral Q8H   PRN Meds: sodium chloride, acetaminophen **OR** acetaminophen, HYDROmorphone  (DILAUDID) injection **OR** HYDROmorphone (DILAUDID) injection, ondansetron **OR** ondansetron (ZOFRAN) IV, polyethylene glycol, sodium chloride flush Continuous Infusions: . sodium chloride 250 mL (04/29/20 2107)  . cefTRIAXone (ROCEPHIN)  IV 2 g (04/30/20 1451)  . vancomycin 1,500 mg (04/30/20 0818)      LOS: 1 day  Time spent: Greater than 50% of the 35 minute visit was spent in counseling/coordination of care for the patient as laid out in the A&P.   Dwyane Dee, MD [facility] 04/30/2020, 3:02 PM  Contact via secure chat.  To contact the attending provider between 7A-7P or the covering provider during after hours 7P-7A, please log into the web site [email or web address] and access using universal [place] password for that web site. If you do not have the password, please call the hospital operator.

## 2020-04-30 NOTE — Hospital Course (Signed)
[name] is a 64 yo female with PMH left breast cancer (currently undergoing radiation), HTN who presented with lower abdominal pain. She has been hospitalized for similar recently with abdominal wall cellulitis and has been followed closely by wound care.  There was concern for recurrent severe abdominal wall cellulitis and wound care nursing was consulted.  She was felt to have intertrigo with skin excoriations which was likely contributing to her pain. She was recommended to have Interdry silver impregnated fabric for approximately 5 days and to pursue keeping the skin dry. She was started on vancomycin, meropenem, fluconazole on admission.  This was deescalated to vancomycin and Rocephin after approximately 2 days.  She is currently receiving radiation treatment for her breast cancer (started 03/26/2020 and scheduled to finish on 05/12/2020).  Previously had been on chemo which was interrupted from previous illnesses as well as poor tolerance and she was instead started on anastrozole. She is from home and her goal is to return there at discharge. She mostly uses a walker and wheelchair to get around at home and has help from some family she says.

## 2020-04-30 NOTE — Assessment & Plan Note (Signed)
-  Renal function currently at baseline.  Avoid nephrotoxic agents as able

## 2020-04-30 NOTE — Progress Notes (Signed)
PHARMACY - PHYSICIAN COMMUNICATION CRITICAL VALUE ALERT - BLOOD CULTURE IDENTIFICATION (BCID)  [name] is an 64 y.o. female who presented to [facility] on 04/29/2020 with a chief complaint of lower abdominal and bilateral lower extremity pain.   Assessment: Currently being treated for lower abdominal cellulitis. Blood cultures 1/4 bottles with methicillin resistant Staph epidermidis.    Name of physician (or Provider) Contacted: Dr. Sabino Gasser  Current antibiotics: Vancomycin, Ceftriaxone  Changes to prescribed antibiotics recommended:  Patient is on recommended antibiotics - No changes needed   MD suspects this may be a contaminant. Continuing current antibiotics for abdominal cellulitis and repeating blood cultures.   Results for orders placed or performed during the hospital encounter of 04/29/20  Blood Culture ID Panel (Reflexed) (Collected: 04/29/2020  6:49 AM)  Result Value Ref Range   Enterococcus faecalis NOT DETECTED NOT DETECTED   Enterococcus Faecium NOT DETECTED NOT DETECTED   Listeria monocytogenes NOT DETECTED NOT DETECTED   Staphylococcus species DETECTED (A) NOT DETECTED   Staphylococcus aureus (BCID) NOT DETECTED NOT DETECTED   Staphylococcus epidermidis DETECTED (A) NOT DETECTED   Staphylococcus lugdunensis NOT DETECTED NOT DETECTED   Streptococcus species NOT DETECTED NOT DETECTED   Streptococcus agalactiae NOT DETECTED NOT DETECTED   Streptococcus pneumoniae NOT DETECTED NOT DETECTED   Streptococcus pyogenes NOT DETECTED NOT DETECTED   A.calcoaceticus-baumannii NOT DETECTED NOT DETECTED   Bacteroides fragilis NOT DETECTED NOT DETECTED   Enterobacterales NOT DETECTED NOT DETECTED   Enterobacter cloacae complex NOT DETECTED NOT DETECTED   Escherichia coli NOT DETECTED NOT DETECTED   Klebsiella aerogenes NOT DETECTED NOT DETECTED   Klebsiella oxytoca NOT DETECTED NOT DETECTED   Klebsiella pneumoniae NOT DETECTED NOT DETECTED   Proteus species NOT DETECTED NOT  DETECTED   Salmonella species NOT DETECTED NOT DETECTED   Serratia marcescens NOT DETECTED NOT DETECTED   Haemophilus influenzae NOT DETECTED NOT DETECTED   Neisseria meningitidis NOT DETECTED NOT DETECTED   Pseudomonas aeruginosa NOT DETECTED NOT DETECTED   Stenotrophomonas maltophilia NOT DETECTED NOT DETECTED   Candida albicans NOT DETECTED NOT DETECTED   Candida auris NOT DETECTED NOT DETECTED   Candida glabrata NOT DETECTED NOT DETECTED   Candida krusei NOT DETECTED NOT DETECTED   Candida parapsilosis NOT DETECTED NOT DETECTED   Candida tropicalis NOT DETECTED NOT DETECTED   Cryptococcus neoformans/gattii NOT DETECTED NOT DETECTED   Methicillin resistance mecA/C DETECTED (A) NOT DETECTED    [name] 04/30/2020  8:14 AM

## 2020-04-30 NOTE — Assessment & Plan Note (Addendum)
-   continue outpatient treatment with oncology  -Continue anastrozole

## 2020-04-30 NOTE — Assessment & Plan Note (Signed)
-  Continue Coreg and hydralazine °

## 2020-05-01 ENCOUNTER — Ambulatory Visit
Admission: RE | Admit: 2020-05-01 | Discharge: 2020-05-01 | Disposition: A | Discharge status: Home / Self Care | Payer: Medicaid Other | Source: Ambulatory Visit | Attending: Radiation Oncology | Admitting: Radiation Oncology

## 2020-05-01 LAB — CBC WITH DIFFERENTIAL/PLATELET
Abs Immature Granulocytes: 0.02 10*3/uL (ref 0.00–0.07)
Basophils Absolute: 0 10*3/uL (ref 0.0–0.1)
Basophils Relative: 1 %
Eosinophils Absolute: 0.2 10*3/uL (ref 0.0–0.5)
Eosinophils Relative: 4 %
HCT: 25.9 % — ABNORMAL LOW (ref 36.0–46.0)
Hemoglobin: 8.1 g/dL — ABNORMAL LOW (ref 12.0–15.0)
Immature Granulocytes: 1 %
Lymphocytes Relative: 18 %
Lymphs Abs: 0.7 10*3/uL (ref 0.7–4.0)
MCH: 32.5 pg (ref 26.0–34.0)
MCHC: 31.3 g/dL (ref 30.0–36.0)
MCV: 104 fL — ABNORMAL HIGH (ref 80.0–100.0)
Monocytes Absolute: 0.6 10*3/uL (ref 0.1–1.0)
Monocytes Relative: 15 %
Neutro Abs: 2.5 10*3/uL (ref 1.7–7.7)
Neutrophils Relative %: 61 %
Platelets: 125 10*3/uL — ABNORMAL LOW (ref 150–400)
RBC: 2.49 MIL/uL — ABNORMAL LOW (ref 3.87–5.11)
RDW: 18.8 % — ABNORMAL HIGH (ref 11.5–15.5)
WBC: 4 10*3/uL (ref 4.0–10.5)
nRBC: 0 % (ref 0.0–0.2)

## 2020-05-01 LAB — BASIC METABOLIC PANEL
Anion gap: 10 (ref 5–15)
BUN: 14 mg/dL (ref 8–23)
CO2: 28 mmol/L (ref 22–32)
Calcium: 8.1 mg/dL — ABNORMAL LOW (ref 8.9–10.3)
Chloride: 103 mmol/L (ref 98–111)
Creatinine, Ser: 1.05 mg/dL — ABNORMAL HIGH (ref 0.44–1.00)
GFR calc Af Amer: >60 mL/min (ref >60)
GFR calc non Af Amer: 56 mL/min — ABNORMAL LOW (ref >60)
Glucose, Bld: 112 mg/dL — ABNORMAL HIGH (ref 70–99)
Potassium: 3.7 mmol/L (ref 3.5–5.1)
Sodium: 141 mmol/L (ref 135–145)

## 2020-05-01 LAB — AEROBIC CULTURE W GRAM STAIN (SUPERFICIAL SPECIMEN): Culture: NORMAL

## 2020-05-01 LAB — MAGNESIUM: Magnesium: 2.1 mg/dL (ref 1.7–2.4)

## 2020-05-01 MED ORDER — CEPHALEXIN 500 MG PO CAPS: 500.0000 mg | ORAL_CAPSULE | Freq: Two times a day (BID) | ORAL | 0 refills | Status: AC

## 2020-05-01 MED ORDER — HEPARIN SOD (PORK) LOCK FLUSH 100 UNIT/ML IV SOLN
500.0000 [IU] | INTRAVENOUS | Status: AC | PRN
  Administered 2020-05-01: 500 [IU]

## 2020-05-01 NOTE — Progress Notes (Signed)
Went over discharge papers with patient and family.  All questions answered.  Port deaccessed.  Pt wheeled out.

## 2020-05-02 ENCOUNTER — Telehealth: Payer: Self-pay

## 2020-05-02 ENCOUNTER — Ambulatory Visit
Admission: RE | Admit: 2020-05-02 | Discharge: 2020-05-02 | Disposition: A | Discharge status: Home / Self Care | Payer: Medicaid Other | Source: Ambulatory Visit | Attending: Radiation Oncology | Admitting: Radiation Oncology

## 2020-05-02 ENCOUNTER — Ambulatory Visit: Payer: Medicaid Other | Admitting: Radiation Oncology

## 2020-05-02 ENCOUNTER — Ambulatory Visit: Payer: Medicaid Other

## 2020-05-02 ENCOUNTER — Other Ambulatory Visit: Payer: Self-pay

## 2020-05-02 DIAGNOSIS — Z510 Encounter for antineoplastic radiation therapy: Secondary | ICD-10-CM | POA: Diagnosis present

## 2020-05-02 DIAGNOSIS — C50412 Malignant neoplasm of upper-outer quadrant of left female breast: Secondary | ICD-10-CM | POA: Diagnosis present

## 2020-05-02 DIAGNOSIS — Z170 Estrogen receptor positive status [ER+]: Secondary | ICD-10-CM | POA: Diagnosis present

## 2020-05-02 LAB — CULTURE, BLOOD (ROUTINE X 2): Special Requests: ADEQUATE

## 2020-05-02 NOTE — Discharge Summary (Signed)
Physician Discharge Summary  [name] CMK:[id number] DOB: 11-27-1955 DOA: 04/29/2020  PCP: [name], MD  Admit date: 04/29/2020 Discharge date: 05/02/2020  Admitted From: Home Disposition:  home Discharging physician: Dwyane Dee, MD  Recommendations for Outpatient Follow-up:  1. Continue outpatient care with oncology  Patient discharged to home in Discharge Condition: stable CODE STATUS: Full Diet recommendation:   Hospital Course: [name] is a 64 yo female with PMH left breast cancer (currently undergoing radiation), HTN who presented with lower abdominal pain. She has been hospitalized for similar recently with abdominal wall cellulitis and has been followed closely by wound care.  There was concern for recurrent severe abdominal wall cellulitis and wound care nursing was consulted.  She was felt to have intertrigo with skin excoriations which was likely contributing to her pain. She was recommended to have Interdry silver impregnated fabric for approximately 5 days and to pursue keeping the skin dry. She was started on vancomycin, meropenem, fluconazole on admission.  This was deescalated to vancomycin and Rocephin after approximately 2 days.  She is currently receiving radiation treatment for her breast cancer (started 03/26/2020 and scheduled to finish on 05/12/2020).  Previously had been on chemo which was interrupted from previous illnesses as well as poor tolerance and she was instead started on anastrozole. She is from home and her goal is to return there at discharge. She mostly uses a walker and wheelchair to get around at home and has help from some family she says.   Malignant neoplasm of upper-outer quadrant of left breast in female, estrogen receptor positive ([place]) - continue outpatient treatment with oncology  -Continue anastrozole  Essential hypertension -Continue Coreg and hydralazine  Cellulitis of abdominal wall -Initially on vancomycin, meropenem,  fluconazole.  De-escalate down to Vanco and Rocephin for now.  Will further de-escalate to oral route at discharge -Evaluated by [facility] RN.  Continue wound recommendations -De-escalate to Keflex to complete course at discharge  Anemia of chronic disease -Baseline hemoglobin 9 to 10 g/dL -Currently stable and at baseline  Chronic kidney disease, stage 3a -Renal function currently at baseline.  Avoid nephrotoxic agents as able    The patient's chronic medical conditions were treated accordingly per the patient's home medication regimen except as noted.  On day of discharge, patient was felt deemed stable for discharge. Patient/family member advised to call PCP or come back to ER if needed.   Discharge Diagnoses:   Principal Diagnosis: Cellulitis of abdominal wall  Active Hospital Problems   Diagnosis Date Noted  . Cellulitis of abdominal wall     Priority: High  . Tinea corporis 04/29/2020  . Anemia of chronic disease 04/29/2020  . Chronic kidney disease, stage 3a 04/29/2020  . Malignant neoplasm of upper-outer quadrant of left breast in female, estrogen receptor positive ([place]) 11/14/2019  . Essential hypertension 06/20/2007    Resolved Hospital Problems  No resolved problems to display.    Discharge Instructions    Increase activity slowly   Complete by: As directed      Allergies as of 05/01/2020      Reactions   Amlodipine Swelling   BLE edema      Medication List    TAKE these medications   acetaminophen 500 MG tablet Commonly known as: TYLENOL Take 500 mg by mouth as needed for mild pain.   anastrozole 1 MG tablet Commonly known as: ARIMIDEX Take 1 tablet (1 mg total) by mouth daily.   aspirin 81 MG EC tablet Take 1 tablet (  81 mg total) by mouth daily. Swallow whole.   Blood Pressure Monitor Devi Use as instructed by provider to check bloos pressure daily. ICD: I10   carvedilol 12.5 MG tablet Commonly known as: COREG Take 1 tablet (12.5 mg total) by mouth  2 (two) times daily with a meal.   cephALEXin 500 MG capsule Commonly known as: KEFLEX Take 1 capsule (500 mg total) by mouth 2 (two) times daily for 5 days. Notes to patient: Treats infection May take with food or milk to avoid stomach upset.   folic acid 1 MG tablet Commonly known as: FOLVITE Take 1 tablet (1 mg total) by mouth daily.   furosemide 20 MG tablet Commonly known as: LASIX TAKE 1 TABLET BY MOUTH EVERY DAY   hydrALAZINE 25 MG tablet Commonly known as: APRESOLINE Take 1 tablet (25 mg total) by mouth every 8 (eight) hours. Notes to patient: tonight   lidocaine-prilocaine cream Commonly known as: EMLA Apply 1 application topically as needed. What changed: reasons to take this       Allergies  Allergen Reactions  . Amlodipine Swelling    BLE edema    Consultations: None  Discharge Exam: BP (!) 107/55 (BP Location: Right Arm)   Pulse 89   Temp 98.5 F (36.9 C) (Oral)   Resp 20   Ht 5\' 1"  (1.549 m)   Wt 136.1 kg   SpO2 100%   BMI 56.68 kg/m  General appearance: alert, cooperative and no distress Head: Normocephalic, without obvious abnormality, atraumatic Eyes: EOMI Lungs: clear to auscultation bilaterally Heart: regular rate and rhythm and S1, S2 normal Abdomen: Obese, soft, nontender, nondistended, bowel sounds present and distant; large pannus appreciated with indurated skin.    Moisture under pannus significantly improved with absorbent pad noted Extremities: trace LE edema Skin: mobility and turgor normal Neurologic: Grossly normal  The results of significant diagnostics from this hospitalization (including imaging, microbiology, ancillary and laboratory) are listed below for reference.   Microbiology: Recent Results (from the past 240 hour(s))  SARS Coronavirus 2 by RT PCR (hospital order, performed in [facility] hospital lab) Nasopharyngeal Nasopharyngeal Swab     Status: None   Collection Time: 04/29/20  4:13 AM   Specimen:  Nasopharyngeal Swab  Result Value Ref Range Status   SARS Coronavirus 2 NEGATIVE NEGATIVE Final    Comment: (NOTE) SARS-CoV-2 target nucleic acids are NOT DETECTED.  The SARS-CoV-2 RNA is generally detectable in upper and lower respiratory specimens during the acute phase of infection. The lowest concentration of SARS-CoV-2 viral copies this assay can detect is 250 copies / mL. A negative result does not preclude SARS-CoV-2 infection and should not be used as the sole basis for treatment or other patient management decisions.  A negative result may occur with improper specimen collection / handling, submission of specimen other than nasopharyngeal swab, presence of viral mutation(s) within the areas targeted by this assay, and inadequate number of viral copies (<250 copies / mL). A negative result must be combined with clinical observations, patient history, and epidemiological information.  Fact Sheet for Patients:   StrictlyIdeas.no  Fact Sheet for Healthcare Providers: BankingDealers.co.za  This test is not yet approved or  cleared by the [place] FDA and has been authorized for detection and/or diagnosis of SARS-CoV-2 by FDA under an Emergency Use Authorization (EUA).  This EUA will remain in effect (meaning this test can be used) for the duration of the COVID-19 declaration under Section 564(b)(1) of the Act, 21 U.S.C. section  360bbb-3(b)(1), unless the authorization is terminated or revoked sooner.  Performed at [facility], [place] [address], [place], [place] [address]   Aerobic Culture (superficial specimen)     Status: None   Collection Time: 04/29/20  6:24 AM   Specimen: Abdomen  Result Value Ref Range Status   Specimen Description   Final    ABDOMEN Performed at [facility] [address] [place], [place] [address]    Special Requests   Final    NONE Performed at [facility], [place] [address], [place], [place] [address]    Gram Stain   Final    FEW WBC PRESENT, PREDOMINANTLY PMN ABUNDANT GRAM NEGATIVE RODS ABUNDANT GRAM POSITIVE COCCI IN PAIRS IN CLUSTERS RARE GRAM POSITIVE RODS    Culture   Final    NORMAL SKIN FLORA NO STAPHYLOCOCCUS AUREUS ISOLATED NO GROUP A STREP (S.PYOGENES) ISOLATED Performed at [facility], [place] [address], [place], [place] [address]    Report Status 05/01/2020 FINAL  Final  Culture, blood (routine x 2)     Status: None (Preliminary result)   Collection Time: 04/29/20  6:43 AM   Specimen: BLOOD  Result Value Ref Range Status   Specimen Description   Final    BLOOD RIGHT ANTECUBITAL Performed at [place] [facility] [address], [place], [place] [address]    Special Requests   Final    BOTTLES DRAWN AEROBIC AND ANAEROBIC Blood Culture adequate volume Performed at [place] [address] [place], [place] [address]    Culture   Final    NO GROWTH 3 DAYS Performed at [facility], [place] [address], [place], [place] [address]    Report Status PENDING  Incomplete  Culture, blood (routine x 2)     Status: Abnormal   Collection Time: 04/29/20  6:49 AM   Specimen: BLOOD  Result Value Ref Range Status   Specimen Description   Final    BLOOD PORTA CATH Performed at [place] [address], [place], [place] [address]    Special Requests   Final    BOTTLES DRAWN AEROBIC AND ANAEROBIC Blood Culture adequate volume Performed at [place] [address], [place], [place] [address]    Culture  Setup Time   Final    GRAM POSITIVE COCCI IN CLUSTERS AEROBIC BOTTLE ONLY CRITICAL RESULT CALLED TO, READ BACK BY AND VERIFIED WITH: Tracy [phone] FCP    Culture (A)  Final    STAPHYLOCOCCUS EPIDERMIDIS THE SIGNIFICANCE OF ISOLATING THIS ORGANISM FROM A SINGLE SET OF BLOOD CULTURES WHEN MULTIPLE SETS ARE  DRAWN IS UNCERTAIN. PLEASE NOTIFY THE MICROBIOLOGY DEPARTMENT WITHIN ONE WEEK IF SPECIATION AND SENSITIVITIES ARE REQUIRED. Performed at [facility], [place] [address] [place], [place] [address]    Report Status 05/02/2020 FINAL  Final  Blood Culture ID Panel (Reflexed)     Status: Abnormal   Collection Time: 04/29/20  6:49 AM  Result Value Ref Range Status   Enterococcus faecalis NOT DETECTED NOT DETECTED Final   Enterococcus Faecium NOT DETECTED NOT DETECTED Final   Listeria monocytogenes NOT DETECTED NOT DETECTED Final   Staphylococcus species DETECTED (A) NOT DETECTED Final    Comment: CRITICAL RESULT CALLED TO, READ BACK BY AND VERIFIED WITH: PHARMD Alvira Philips [phone] FCP    Staphylococcus aureus (BCID) NOT DETECTED NOT DETECTED Final   Staphylococcus epidermidis DETECTED (A)  NOT DETECTED Final    Comment: Methicillin (oxacillin) resistant coagulase negative staphylococcus. Possible blood culture contaminant (unless isolated from more than one blood culture draw or clinical case suggests pathogenicity). No antibiotic treatment is indicated for blood  culture contaminants. CRITICAL RESULT CALLED TO, READ BACK BY AND VERIFIED WITH: [place] [phone] FCP    Staphylococcus lugdunensis NOT DETECTED NOT DETECTED Final   Streptococcus species NOT DETECTED NOT DETECTED Final   Streptococcus agalactiae NOT DETECTED NOT DETECTED Final   Streptococcus pneumoniae NOT DETECTED NOT DETECTED Final   Streptococcus pyogenes NOT DETECTED NOT DETECTED Final   A.calcoaceticus-baumannii NOT DETECTED NOT DETECTED Final   Bacteroides fragilis NOT DETECTED NOT DETECTED Final   Enterobacterales NOT DETECTED NOT DETECTED Final   Enterobacter cloacae complex NOT DETECTED NOT DETECTED Final   Escherichia coli NOT DETECTED NOT DETECTED Final   Klebsiella aerogenes NOT DETECTED NOT DETECTED Final   Klebsiella oxytoca NOT DETECTED NOT DETECTED Final   Klebsiella pneumoniae NOT DETECTED  NOT DETECTED Final   Proteus species NOT DETECTED NOT DETECTED Final   Salmonella species NOT DETECTED NOT DETECTED Final   Serratia marcescens NOT DETECTED NOT DETECTED Final   Haemophilus influenzae NOT DETECTED NOT DETECTED Final   Neisseria meningitidis NOT DETECTED NOT DETECTED Final   Pseudomonas aeruginosa NOT DETECTED NOT DETECTED Final   Stenotrophomonas maltophilia NOT DETECTED NOT DETECTED Final   Candida albicans NOT DETECTED NOT DETECTED Final   Candida auris NOT DETECTED NOT DETECTED Final   Candida glabrata NOT DETECTED NOT DETECTED Final   Candida krusei NOT DETECTED NOT DETECTED Final   Candida parapsilosis NOT DETECTED NOT DETECTED Final   Candida tropicalis NOT DETECTED NOT DETECTED Final   Cryptococcus neoformans/gattii NOT DETECTED NOT DETECTED Final   Methicillin resistance mecA/C DETECTED (A) NOT DETECTED Final    Comment: CRITICAL RESULT CALLED TO, READ BACK BY AND VERIFIED WITH: Shelby Mattocks [phone] FCP Performed at [facility], [address], [place], [place] [address]   Culture, blood (routine x 2)     Status: None (Preliminary result)   Collection Time: 04/30/20  8:22 AM   Specimen: BLOOD RIGHT HAND  Result Value Ref Range Status   Specimen Description   Final    BLOOD RIGHT HAND Performed at [place] [address] [place], [place] [address]    Special Requests   Final    BOTTLES DRAWN AEROBIC AND ANAEROBIC Blood Culture adequate volume Performed at [facility] [address], [place], [place] [address]    Culture   Final    NO GROWTH 2 DAYS Performed at [facility] [address], [place], [place] [address]    Report Status PENDING  Incomplete  Culture, blood (routine x 2)     Status: None (Preliminary result)   Collection Time: 04/30/20  8:34 AM   Specimen: BLOOD RIGHT HAND  Result Value Ref Range Status   Specimen Description   Final    BLOOD RIGHT HAND Performed at  [facility] [address], [place], [place] [address]    Special Requests   Final    BOTTLES DRAWN AEROBIC AND ANAEROBIC Blood Culture adequate volume Performed at [facility] [address], [place], [place] [address]    Culture   Final    NO GROWTH 2 DAYS Performed at [facility] [address] [place], [place] [address]    Report Status PENDING  Incomplete     Labs: BNP (last 3 results) Recent Labs    04/28/20 2341  BNP 102.5*   Basic Metabolic Panel: Recent Labs  Lab 04/28/20 2341 04/30/20 0336 05/01/20 0318  NA 142 141 141  K 3.8 3.9 3.7  CL 102 103 103  CO2 23 26 28   GLUCOSE 104* 96 112*  BUN 12 14 14   CREATININE 1.20* 1.07* 1.05*  CALCIUM 8.3* 7.9* 8.1*  MG  --  1.1* 2.1   Liver Function Tests: Recent Labs  Lab 04/28/20 2341 04/30/20 0336  AST 64* 49*  ALT 25 24  ALKPHOS 105 94  BILITOT 0.9 1.4*  PROT 7.3 7.1  ALBUMIN 2.8* 2.6*   Recent Labs  Lab 04/28/20 2341  LIPASE 29   No results for input(s): AMMONIA in the last 168 hours. CBC: Recent Labs  Lab 04/28/20 2341 04/30/20 0336 05/01/20 0318  WBC 4.5 5.3 4.0  NEUTROABS  --  3.4 2.5  HGB 9.4* 8.7* 8.1*  HCT 29.1* 26.9* 25.9*  MCV 100.3* 101.9* 104.0*  PLT 168 141* 125*   Cardiac Enzymes: No results for input(s): CKTOTAL, CKMB, CKMBINDEX, TROPONINI in the last 168 hours. BNP: Invalid input(s): POCBNP CBG: No results for input(s): GLUCAP in the last 168 hours. D-Dimer No results for input(s): DDIMER in the last 72 hours. Hgb A1c No results for input(s): HGBA1C in the last 72 hours. Lipid Profile No results for input(s): CHOL, HDL, LDLCALC, TRIG, CHOLHDL, LDLDIRECT in the last 72 hours. Thyroid function studies No results for input(s): TSH, T4TOTAL, T3FREE, THYROIDAB in the last 72 hours.  Invalid input(s): FREET3 Anemia work up No results for input(s): VITAMINB12, FOLATE, FERRITIN, TIBC, IRON, RETICCTPCT in the last 72  hours. Urinalysis    Component Value Date/Time   COLORURINE YELLOW 04/29/2020 0614   APPEARANCEUR HAZY (A) 04/29/2020 0614   LABSPEC 1.006 04/29/2020 0614   PHURINE 5.0 04/29/2020 0614   GLUCOSEU NEGATIVE 04/29/2020 0614   HGBUR NEGATIVE 04/29/2020 0614   BILIRUBINUR NEGATIVE 04/29/2020 0614   KETONESUR NEGATIVE 04/29/2020 0614   PROTEINUR NEGATIVE 04/29/2020 0614   UROBILINOGEN 1.0 07/14/2012 1630   NITRITE NEGATIVE 04/29/2020 0614   LEUKOCYTESUR NEGATIVE 04/29/2020 8527   Sepsis Labs Invalid input(s): PROCALCITONIN,  WBC,  LACTICIDVEN Microbiology Recent Results (from the past 240 hour(s))  SARS Coronavirus 2 by RT PCR (hospital order, performed in [place] hospital lab) Nasopharyngeal Nasopharyngeal Swab     Status: None   Collection Time: 04/29/20  4:13 AM   Specimen: Nasopharyngeal Swab  Result Value Ref Range Status   SARS Coronavirus 2 NEGATIVE NEGATIVE Final    Comment: (NOTE) SARS-CoV-2 target nucleic acids are NOT DETECTED.  The SARS-CoV-2 RNA is generally detectable in upper and lower respiratory specimens during the acute phase of infection. The lowest concentration of SARS-CoV-2 viral copies this assay can detect is 250 copies / mL. A negative result does not preclude SARS-CoV-2 infection and should not be used as the sole basis for treatment or other patient management decisions.  A negative result may occur with improper specimen collection / handling, submission of specimen other than nasopharyngeal swab, presence of viral mutation(s) within the areas targeted by this assay, and inadequate number of viral copies (<250 copies / mL). A negative result must be combined with clinical observations, patient history, and epidemiological information.  Fact Sheet for Patients:   StrictlyIdeas.no  Fact Sheet for Healthcare Providers: BankingDealers.co.za  This test is not yet approved or  cleared by the [place]  States FDA and has been authorized for detection and/or diagnosis of SARS-CoV-2 by FDA under an Emergency Use Authorization (EUA).  This EUA will remain in effect (meaning this test can be used) for the duration of the COVID-19 declaration under Section 564(b)(1) of the Act, 21 U.S.C. section 360bbb-3(b)(1), unless the authorization is terminated or revoked sooner.  Performed at [facility], [place] [address] [place], [place] [address]   Aerobic Culture (superficial specimen)     Status: None   Collection Time: 04/29/20  6:24 AM   Specimen: Abdomen  Result Value Ref Range Status   Specimen Description   Final    ABDOMEN Performed at [facility] [address], [place], [place] [address]    Special Requests   Final    NONE Performed at [facility], [place] [address], [place], [place] [address]    Gram Stain   Final    FEW WBC PRESENT, PREDOMINANTLY PMN ABUNDANT GRAM NEGATIVE RODS ABUNDANT GRAM POSITIVE COCCI IN PAIRS IN CLUSTERS RARE GRAM POSITIVE RODS    Culture   Final    NORMAL SKIN FLORA NO STAPHYLOCOCCUS AUREUS ISOLATED NO GROUP A STREP (S.PYOGENES) ISOLATED Performed at [facility], [place] [address], [place], [place] [address]    Report Status 05/01/2020 FINAL  Final  Culture, blood (routine x 2)     Status: None (Preliminary result)   Collection Time: 04/29/20  6:43 AM   Specimen: BLOOD  Result Value Ref Range Status   Specimen Description   Final    BLOOD RIGHT ANTECUBITAL Performed at [facility] [address] [place] [address], [place], [place] [address]    Special Requests   Final    BOTTLES DRAWN AEROBIC AND ANAEROBIC Blood Culture adequate volume Performed at [facility] [address] [place] [address], [place], [place] [address]    Culture   Final    NO GROWTH 3 DAYS Performed at [facility], [place] [address] [place], [place] [address]    Report Status PENDING  Incomplete   Culture, blood (routine x 2)     Status: Abnormal   Collection Time: 04/29/20  6:49 AM   Specimen: BLOOD  Result Value Ref Range Status   Specimen Description   Final    BLOOD PORTA CATH Performed at [facility] [address] [place], [place] [address]    Special Requests   Final    BOTTLES DRAWN AEROBIC AND ANAEROBIC Blood Culture adequate volume Performed at [facility] [address], [place], [place] [address]    Culture  Setup Time   Final    GRAM POSITIVE COCCI IN CLUSTERS AEROBIC BOTTLE ONLY CRITICAL RESULT CALLED TO, READ BACK BY AND VERIFIED WITH: [place] [phone] FCP    Culture (A)  Final    STAPHYLOCOCCUS EPIDERMIDIS THE SIGNIFICANCE OF ISOLATING THIS ORGANISM FROM A SINGLE SET OF BLOOD CULTURES WHEN MULTIPLE SETS ARE DRAWN IS UNCERTAIN. PLEASE NOTIFY THE MICROBIOLOGY DEPARTMENT WITHIN ONE WEEK IF SPECIATION AND SENSITIVITIES ARE REQUIRED. Performed at [facility], [place] [address], [place],  [address]    Report Status 05/02/2020 FINAL  Final  Blood Culture ID Panel (Reflexed)     Status: Abnormal   Collection Time: 04/29/20  6:49 AM  Result Value Ref Range Status   Enterococcus faecalis NOT DETECTED NOT DETECTED Final   Enterococcus Faecium NOT DETECTED NOT DETECTED Final   Listeria monocytogenes NOT DETECTED NOT  DETECTED Final   Staphylococcus species DETECTED (A) NOT DETECTED Final    Comment: CRITICAL RESULT CALLED TO, READ BACK BY AND VERIFIED WITH: PHARMD Alvira Philips [phone] FCP    Staphylococcus aureus (BCID) NOT DETECTED NOT DETECTED Final   Staphylococcus epidermidis DETECTED (A) NOT DETECTED Final    Comment: Methicillin (oxacillin) resistant coagulase negative staphylococcus. Possible blood culture contaminant (unless isolated from more than one blood culture draw or clinical case suggests pathogenicity). No antibiotic treatment is indicated for blood  culture contaminants. CRITICAL  RESULT CALLED TO, READ BACK BY AND VERIFIED WITH: [place] [phone] FCP    Staphylococcus lugdunensis NOT DETECTED NOT DETECTED Final   Streptococcus species NOT DETECTED NOT DETECTED Final   Streptococcus agalactiae NOT DETECTED NOT DETECTED Final   Streptococcus pneumoniae NOT DETECTED NOT DETECTED Final   Streptococcus pyogenes NOT DETECTED NOT DETECTED Final   A.calcoaceticus-baumannii NOT DETECTED NOT DETECTED Final   Bacteroides fragilis NOT DETECTED NOT DETECTED Final   Enterobacterales NOT DETECTED NOT DETECTED Final   Enterobacter cloacae complex NOT DETECTED NOT DETECTED Final   Escherichia coli NOT DETECTED NOT DETECTED Final   Klebsiella aerogenes NOT DETECTED NOT DETECTED Final   Klebsiella oxytoca NOT DETECTED NOT DETECTED Final   Klebsiella pneumoniae NOT DETECTED NOT DETECTED Final   Proteus species NOT DETECTED NOT DETECTED Final   Salmonella species NOT DETECTED NOT DETECTED Final   Serratia marcescens NOT DETECTED NOT DETECTED Final   Haemophilus influenzae NOT DETECTED NOT DETECTED Final   Neisseria meningitidis NOT DETECTED NOT DETECTED Final   Pseudomonas aeruginosa NOT DETECTED NOT DETECTED Final   Stenotrophomonas maltophilia NOT DETECTED NOT DETECTED Final   Candida albicans NOT DETECTED NOT DETECTED Final   Candida auris NOT DETECTED NOT DETECTED Final   Candida glabrata NOT DETECTED NOT DETECTED Final   Candida krusei NOT DETECTED NOT DETECTED Final   Candida parapsilosis NOT DETECTED NOT DETECTED Final   Candida tropicalis NOT DETECTED NOT DETECTED Final   Cryptococcus neoformans/gattii NOT DETECTED NOT DETECTED Final   Methicillin resistance mecA/C DETECTED (A) NOT DETECTED Final    Comment: CRITICAL RESULT CALLED TO, READ BACK BY AND VERIFIED WITH: Shelby Mattocks [phone] FCP Performed at [facility], [address], [place], [place] [address]   Culture, blood (routine x 2)     Status: None (Preliminary result)   Collection  Time: 04/30/20  8:22 AM   Specimen: BLOOD RIGHT HAND  Result Value Ref Range Status   Specimen Description   Final    BLOOD RIGHT HAND Performed at [facility] [address] [place], [place] [address]    Special Requests   Final    BOTTLES DRAWN AEROBIC AND ANAEROBIC Blood Culture adequate volume Performed at [facility] [address] [place], [place] [address]    Culture   Final    NO GROWTH 2 DAYS Performed at [facility] [address] [place], [place] [address]    Report Status PENDING  Incomplete  Culture, blood (routine x 2)     Status: None (Preliminary result)   Collection Time: 04/30/20  8:34 AM   Specimen: BLOOD RIGHT HAND  Result Value Ref Range Status   Specimen Description   Final    BLOOD RIGHT HAND Performed at [facility] [address] [place], [place] [address]    Special Requests   Final    BOTTLES DRAWN AEROBIC AND ANAEROBIC Blood Culture  adequate volume Performed at [facility] [address], [place], [place] [address]    Culture   Final    NO GROWTH 2 DAYS Performed at [facility] [address], [place], [place] [address]    Report Status PENDING  Incomplete    Procedures/Studies: DG Chest 2 View  Result Date: 04/29/2020 CLINICAL DATA:  [place]as[place], upper outer quadrant left breast cancer with lymph node metastases EXAM: CHEST - 2 VIEW COMPARISON:  02/10/2020 FINDINGS: Frontal and lateral views of the chest demonstrates stable right chest wall port via subclavian approach, tip within the superior vena cava. The cardiac silhouette is enlarged but stable. No acute airspace disease, effusion, or pneumothorax. Postsurgical changes within the left breast and axilla. No acute or destructive bony lesions. IMPRESSION: 1. Stable exam, no acute process. Electronically Signed   By: Randa Ngo M.D.   On: 04/29/2020 03:52     Time coordinating discharge: Over  30 minutes    Dwyane Dee, MD  [facility] 05/02/2020, 4:50 PM Pager: Secure chat  If 7PM-7AM, please contact night-coverage [email or web address] Password [email or web address]

## 2020-05-02 NOTE — Telephone Encounter (Signed)
Transition Care Management Follow-up Telephone Call  Date of discharge and from where: 05/01/2020, [facility]   How have you been since you were released from the hospital? She said she is okay, tired and at the oncology office right now waiting to be seen   Any questions or concerns?  she said that her legs are painful even though the swelling has gone down.  She plans to talk to the oncologist about it when she sees him this afternoon.   Items Reviewed:  Did the pt receive and understand the discharge instructions provided?  yes   Medications obtained and verified?  she said she has all medications and did not have any questions about her med regime  Any new allergies since your discharge?  none reported   Do you have support at home? she said she has help.  She lives alone but her son and grandchildren check on her regularly.  One of the grandchildren always stays with her at night.   She has a personal care aid 2 hours every day.  Has walker and hospital bed and her mother's wheelchair.  She said she needs a larger wheelchair.  [facility] has the order but does not have any at this time  She is still waiting for a large bedside commode which [facility] is also out of at this time.   She said that she was shown how to do her abdominal wound care with Inter- dry dressing and was given extra supply of the dressing.   Functional Questionnaire: (I = Independent and D = Dependent) ADLs: family and aid provided assistance as needed  Follow up appointments reviewed:   PCP Hospital f/u appt confirmed?  Dr [name] 05/12/2020   [facility] f/u appt confirmed?  Oncology appointments are scheduled and patient is aware of them   Are transportation arrangements needed? no, she uses [facility] [employer] for transportation   If their condition worsens, is the pt aware to call PCP or go to the Emergency Dept.? yes  Was the patient provided with contact information for the  PCP's office or ED?  she has the phone number for the clinic  Was to pt encouraged to call back with questions or concerns? yes

## 2020-05-02 NOTE — Telephone Encounter (Signed)
From the discharge call.  Patient has appt with Dr [name] 05/12/2020,     She said she is okay, tired and at the oncology office right now waiting to be seen     she said that her legs are painful even though the swelling has gone down.  She plans to talk to the oncologist about it when she sees him this afternoon.    she said she has all medications and did not have any questions about her med regime    She lives alone but her son and grandchildren check on her regularly.  One of the grandchildren always stays with her at night.   She has a personal care aid 2 hours every day.  Has walker and hospital bed and her mother's wheelchair.  She said she needs a larger wheelchair.  [facility] has the order but does not have any at this time  She is still waiting for a large bedside commode which [facility] is also out of at this time.   She said that she was shown how to do her abdominal wound care with Inter- dry dressing and was given extra supply of the dressing

## 2020-05-04 LAB — CULTURE, BLOOD (ROUTINE X 2)
Culture: NO GROWTH
Special Requests: ADEQUATE

## 2020-05-05 LAB — CULTURE, BLOOD (ROUTINE X 2)
Culture: NO GROWTH
Culture: NO GROWTH
Special Requests: ADEQUATE
Special Requests: ADEQUATE

## 2020-05-06 ENCOUNTER — Other Ambulatory Visit: Payer: Self-pay

## 2020-05-06 ENCOUNTER — Ambulatory Visit
Admission: RE | Admit: 2020-05-06 | Discharge: 2020-05-06 | Disposition: A | Discharge status: Home / Self Care | Payer: Medicaid Other | Source: Ambulatory Visit | Attending: Radiation Oncology | Admitting: Radiation Oncology

## 2020-05-06 ENCOUNTER — Ambulatory Visit: Payer: Medicaid Other

## 2020-05-06 DIAGNOSIS — C50412 Malignant neoplasm of upper-outer quadrant of left female breast: Secondary | ICD-10-CM | POA: Diagnosis not present

## 2020-05-07 ENCOUNTER — Ambulatory Visit: Payer: Medicaid Other

## 2020-05-07 ENCOUNTER — Other Ambulatory Visit: Payer: Self-pay

## 2020-05-08 ENCOUNTER — Ambulatory Visit: Payer: Medicaid Other

## 2020-05-08 ENCOUNTER — Other Ambulatory Visit: Payer: Self-pay

## 2020-05-08 ENCOUNTER — Encounter: Payer: Self-pay | Admitting: *Deleted

## 2020-05-08 ENCOUNTER — Ambulatory Visit
Admission: RE | Admit: 2020-05-08 | Discharge: 2020-05-08 | Disposition: A | Discharge status: Home / Self Care | Payer: Medicaid Other | Source: Ambulatory Visit | Attending: Radiation Oncology | Admitting: Radiation Oncology

## 2020-05-08 DIAGNOSIS — C50412 Malignant neoplasm of upper-outer quadrant of left female breast: Secondary | ICD-10-CM | POA: Diagnosis not present

## 2020-05-09 ENCOUNTER — Ambulatory Visit
Admission: RE | Admit: 2020-05-09 | Discharge: 2020-05-09 | Disposition: A | Discharge status: Home / Self Care | Payer: Medicaid Other | Source: Ambulatory Visit | Attending: Radiation Oncology | Admitting: Radiation Oncology

## 2020-05-09 ENCOUNTER — Ambulatory Visit: Payer: Medicaid Other

## 2020-05-09 ENCOUNTER — Ambulatory Visit: Payer: Medicaid Other | Admitting: Radiation Oncology

## 2020-05-09 DIAGNOSIS — C50412 Malignant neoplasm of upper-outer quadrant of left female breast: Secondary | ICD-10-CM | POA: Diagnosis not present

## 2020-05-12 ENCOUNTER — Ambulatory Visit: Payer: Medicaid Other

## 2020-05-12 ENCOUNTER — Ambulatory Visit: Payer: Medicaid Other | Attending: Family Medicine | Admitting: Family Medicine

## 2020-05-12 ENCOUNTER — Encounter: Payer: Self-pay | Admitting: Family Medicine

## 2020-05-12 ENCOUNTER — Ambulatory Visit
Admission: RE | Admit: 2020-05-12 | Discharge: 2020-05-12 | Disposition: A | Discharge status: Home / Self Care | Payer: Medicaid Other | Source: Ambulatory Visit | Attending: Radiation Oncology | Admitting: Radiation Oncology

## 2020-05-12 ENCOUNTER — Other Ambulatory Visit: Payer: Self-pay

## 2020-05-12 VITALS — BP 105/60 | HR 89 | Ht 61.0 in

## 2020-05-12 DIAGNOSIS — C50412 Malignant neoplasm of upper-outer quadrant of left female breast: Secondary | ICD-10-CM

## 2020-05-12 DIAGNOSIS — Z23 Encounter for immunization: Secondary | ICD-10-CM

## 2020-05-12 DIAGNOSIS — L03311 Cellulitis of abdominal wall: Secondary | ICD-10-CM

## 2020-05-12 DIAGNOSIS — I872 Venous insufficiency (chronic) (peripheral): Secondary | ICD-10-CM

## 2020-05-12 DIAGNOSIS — Z170 Estrogen receptor positive status [ER+]: Secondary | ICD-10-CM

## 2020-05-12 MED ORDER — IBUPROFEN 600 MG PO TABS: 600.0000 mg | ORAL_TABLET | Freq: Two times a day (BID) | ORAL | 1 refills | Status: DC | PRN

## 2020-05-12 NOTE — Progress Notes (Signed)
Subjective:  Patient ID: [name], female    DOB: Jun 17, 1956  Age: 64 y.o. MRN: [id number]  CC: Hospitalization Follow-up   HPI [name]  is a 64 year old female with a history of morbid obesity, left breast T2N1 stage IIb, grade 3 ER PR positive, HER -2 not amplified breast cancer (status post modified left radical mastectomy and axillary lymph node dissection s/p chemotherapy, currently on radiation), hypertension who presents today at the transitional care clinic for follow up after hospitalization at [facility] from 04/29/2020 through 05/01/2020 for cellulitis of abdominal wall. During hospitalization she was also thought to have intertrigo.  Treated with IV antibiotics-vancomycin, meropenem, fluconazole which was subsequently switched to vancomycin and Rocephin and she was transitioned to Keflex at discharge.  She was discharged with home health nursing and she does have an aide who comes to her house 2 hours daily 7 days a week and assist with her grooming and wound care.  She also has her grandchildren at home. Overall she feels better and states her cellulitis has improved significantly. Currently tolerating radiation but sometimes feels fatigued. She requests Ibuprofen for the pain in her legs as she does have chronic venous insufficiency with intermittent pain.  Compliant with antihypertensive and Lasix and she has no additional concerns at this time. Willing to receive the flu shot.  Past Medical History:  Diagnosis Date  . Arthritis   . Cancer ([place]) 11/2019   Left breast  . Diverticulitis   . Family history of ovarian cancer   . Heart murmur    11/26/19 echo: Mild AS. AV mean gradient 12.0 mmHg; however, LVOT gradient 8 mmHg with intracvitary gradient-significant AS is not suspected  . Hypertension     Past Surgical History:  Procedure Laterality Date  . MASTECTOMY WITH AXILLARY LYMPH NODE DISSECTION Left 12/19/2019   Procedure: LEFT MASTECTOMY WITH  AXILLARY LYMPH NODE DISSECTION;  Surgeon: [name], MD;  Location: [address];  Service: General;  Laterality: Left;  Marland Kitchen MULTIPLE EXTRACTIONS WITH ALVEOLOPLASTY Bilateral 12/14/2019   Procedure: MULTIPLE EXTRACTION WITH ALVEOLOPLASTY;  Surgeon: [name], DDS;  Location: [place];  Service: Oral Surgery;  Laterality: Bilateral;  . PORTACATH PLACEMENT N/A 12/19/2019   Procedure: INSERTION PORT-A-CATH WITH ULTRASOUND GUIDANCE;  Surgeon: [name], MD;  Location: [place];  Service: General;  Laterality: N/A;  . RADIAL HEAD ARTHROPLASTY Right 06/25/2019   Procedure: RIGHT RADIAL HEAD ARTHROPLASTY;  Surgeon: [name], MD;  Location: [facility];  Service: Orthopedics;  Laterality: Right;  . TUBAL LIGATION      Family History  Problem Relation Age of Onset  . Hypertension Mother   . Dementia Mother   . Ovarian cancer Half-Sister 69  . Diabetes Half-Sister   . Stroke Half-Sister     Allergies  Allergen Reactions  . Amlodipine Swelling    BLE edema    Outpatient Medications Prior to Visit  Medication Sig Dispense Refill  . acetaminophen (TYLENOL) 500 MG tablet Take 500 mg by mouth as needed for mild pain.    Marland Kitchen anastrozole (ARIMIDEX) 1 MG tablet Take 1 tablet (1 mg total) by mouth daily. 90 tablet 4  . aspirin EC 81 MG EC tablet Take 1 tablet (81 mg total) by mouth daily. Swallow whole. 30 tablet 11  . Blood Pressure Monitor DEVI Use as instructed by provider to check bloos pressure daily. ICD: I10 1 each 0  . carvedilol (COREG) 12.5 MG tablet Take 1 tablet (12.5 mg total) by mouth 2 (two)  times daily with a meal. 60 tablet 3  . folic acid (FOLVITE) 1 MG tablet Take 1 tablet (1 mg total) by mouth daily.    . furosemide (LASIX) 20 MG tablet TAKE 1 TABLET BY MOUTH EVERY DAY (Patient taking differently: Take 20 mg by mouth daily. ) 30 tablet 0  . hydrALAZINE (APRESOLINE) 25 MG tablet Take 1 tablet (25 mg total) by mouth every 8 (eight) hours. 90 tablet 3  . lidocaine-prilocaine (EMLA)  cream Apply 1 application topically as needed. (Patient taking differently: Apply 1 application topically as needed (port). ) 30 g 0   No facility-administered medications prior to visit.     ROS Review of Systems  Constitutional: Negative for activity change, appetite change and fatigue.  HENT: Negative for congestion, sinus pressure and sore throat.   Eyes: Negative for visual disturbance.  Respiratory: Negative for cough, chest tightness, shortness of breath and wheezing.   Cardiovascular: Negative for chest pain and palpitations.  Gastrointestinal: Negative for abdominal distention, abdominal pain and constipation.  Endocrine: Negative for polydipsia.  Genitourinary: Negative for dysuria and frequency.  Musculoskeletal: Negative for arthralgias and back pain.  Skin: Negative for rash.  Neurological: Negative for tremors, light-headedness and numbness.  Hematological: Does not bruise/bleed easily.  Psychiatric/Behavioral: Negative for agitation and behavioral problems.    Objective:  Ht 5\' 1"  (1.549 m)   BMI 56.68 kg/m   BP/Weight 05/12/2020 05/01/2020 04/12/4817  Systolic BP - 563 -  Diastolic BP - 55 -  Wt. (Lbs) - - -  BMI 56.68 - 56.68  Some encounter information is confidential and restricted. Go to Review Flowsheets activity to see all data.      Physical Exam Constitutional:      Appearance: She is well-developed. She is obese.  Neck:     Vascular: No JVD.  Cardiovascular:     Rate and Rhythm: Normal rate.     Heart sounds: Normal heart sounds. No murmur heard.   Pulmonary:     Effort: Pulmonary effort is normal.     Breath sounds: Normal breath sounds. No wheezing or rales.  Chest:     Chest wall: No tenderness.  Abdominal:     General: Bowel sounds are normal. There is distension.     Palpations: Abdomen is soft. There is no mass.     Tenderness: There is no abdominal tenderness.  Musculoskeletal:        General: Normal range of motion.     Right  lower leg: No edema.     Left lower leg: No edema.  Skin:    Comments: Abdominal wall skin with no evident ulceration. Powder in skin fold  Neurological:     Mental Status: She is alert and oriented to person, place, and time.     Gait: Gait abnormal.  Psychiatric:        Mood and Affect: Mood normal.     CMP Latest Ref Rng & Units 05/01/2020 04/30/2020 04/28/2020  Glucose 70 - 99 mg/dL 112(H) 96 104(H)  BUN 8 - 23 mg/dL 14 14 12   Creatinine 0.44 - 1.00 mg/dL 1.05(H) 1.07(H) 1.20(H)  Sodium 135 - 145 mmol/L 141 141 142  Potassium 3.5 - 5.1 mmol/L 3.7 3.9 3.8  Chloride 98 - 111 mmol/L 103 103 102  CO2 22 - 32 mmol/L 28 26 23   Calcium 8.9 - 10.3 mg/dL 8.1(L) 7.9(L) 8.3(L)  Total Protein 6.5 - 8.1 g/dL - 7.1 7.3  Total Bilirubin 0.3 - 1.2 mg/dL - 1.4(H)  0.9  Alkaline Phos 38 - 126 U/L - 94 105  AST 15 - 41 U/L - 49(H) 64(H)  ALT 0 - 44 U/L - 24 25    Lipid Panel     Component Value Date/Time   CHOL 216 (H) 02/10/2020 1536   TRIG 102 02/10/2020 1536   HDL 100 02/10/2020 1536   CHOLHDL 2.2 02/10/2020 1536   VLDL 20 02/10/2020 1536   LDLCALC 96 02/10/2020 1536    CBC    Component Value Date/Time   WBC 4.0 05/01/2020 0318   RBC 2.49 (L) 05/01/2020 0318   HGB 8.1 (L) 05/01/2020 0318   HGB 10.7 (L) 03/25/2020 1103   HCT 25.9 (L) 05/01/2020 0318   HCT 31.5 (L) 03/25/2020 1103   PLT 125 (L) 05/01/2020 0318   PLT 352 03/25/2020 1103   MCV 104.0 (H) 05/01/2020 0318   MCV 91 03/25/2020 1103   MCH 32.5 05/01/2020 0318   MCHC 31.3 05/01/2020 0318   RDW 18.8 (H) 05/01/2020 0318   RDW 15.3 03/25/2020 1103   LYMPHSABS 0.7 05/01/2020 0318   LYMPHSABS 1.5 03/25/2020 1103   MONOABS 0.6 05/01/2020 0318   EOSABS 0.2 05/01/2020 0318   EOSABS 0.4 03/25/2020 1103   BASOSABS 0.0 05/01/2020 0318   BASOSABS 0.0 03/25/2020 1103    Lab Results  Component Value Date   HGBA1C 6.1 (H) 03/03/2020    Assessment & Plan:  1. Venous insufficiency of both lower extremities Continue to  elevate feet She uses ibuprofen as needed for pain in lower extremity - ibuprofen (ADVIL) 600 MG tablet; Take 1 tablet (600 mg total) by mouth every 12 (twelve) hours as needed.  Dispense: 60 tablet; Refill: 1  2. Malignant neoplasm of upper-outer quadrant of left breast in female, estrogen receptor positive ([place]) Status post left mastectomy Unable to tolerate chemo Currently on radiation Follow-up fluids medical and radiation oncology   3. Abdominal wall cellulitis Completed course of antibiotics Significant improvement Continue skin care    No orders of the defined types were placed in this encounter.   Follow-up: Return in about 1 month (around 06/11/2020) for PCP- Dr Chapman Fitch for chronic disease management.       Charlott Rakes, MD, FAAFP. [facility] [place], [place]   05/12/2020, 3:59 PM

## 2020-05-12 NOTE — Patient Instructions (Signed)

## 2020-05-12 NOTE — Progress Notes (Signed)
States that she would like medication for her constant leg pain.

## 2020-05-13 ENCOUNTER — Ambulatory Visit: Payer: Medicaid Other

## 2020-05-13 ENCOUNTER — Telehealth: Payer: Self-pay

## 2020-05-13 ENCOUNTER — Ambulatory Visit: Payer: Self-pay

## 2020-05-13 NOTE — Telephone Encounter (Signed)
Call placed to [name] regarding [name] # [phone].  She confirmed that the patient has  receiving her personal care services since 03/28/2020

## 2020-05-14 ENCOUNTER — Other Ambulatory Visit: Payer: Self-pay

## 2020-05-14 ENCOUNTER — Inpatient Hospital Stay: Payer: Medicaid Other | Attending: Oncology | Admitting: Oncology

## 2020-05-14 ENCOUNTER — Inpatient Hospital Stay: Payer: Medicaid Other

## 2020-05-14 ENCOUNTER — Ambulatory Visit
Admission: RE | Admit: 2020-05-14 | Discharge: 2020-05-14 | Disposition: A | Discharge status: Home / Self Care | Payer: Medicaid Other | Source: Ambulatory Visit | Attending: Radiation Oncology | Admitting: Radiation Oncology

## 2020-05-14 ENCOUNTER — Ambulatory Visit: Payer: Medicaid Other

## 2020-05-14 VITALS — BP 96/65 | HR 85 | Temp 97.1°F | Resp 18 | Ht 61.0 in | Wt 305.3 lb

## 2020-05-14 DIAGNOSIS — Z818 Family history of other mental and behavioral disorders: Secondary | ICD-10-CM | POA: Insufficient documentation

## 2020-05-14 DIAGNOSIS — R238 Other skin changes: Secondary | ICD-10-CM | POA: Diagnosis not present

## 2020-05-14 DIAGNOSIS — Z8249 Family history of ischemic heart disease and other diseases of the circulatory system: Secondary | ICD-10-CM | POA: Diagnosis not present

## 2020-05-14 DIAGNOSIS — L98491 Non-pressure chronic ulcer of skin of other sites limited to breakdown of skin: Secondary | ICD-10-CM

## 2020-05-14 DIAGNOSIS — C50812 Malignant neoplasm of overlapping sites of left female breast: Secondary | ICD-10-CM | POA: Diagnosis not present

## 2020-05-14 DIAGNOSIS — Z8041 Family history of malignant neoplasm of ovary: Secondary | ICD-10-CM | POA: Diagnosis not present

## 2020-05-14 DIAGNOSIS — Z7289 Other problems related to lifestyle: Secondary | ICD-10-CM | POA: Diagnosis not present

## 2020-05-14 DIAGNOSIS — C50412 Malignant neoplasm of upper-outer quadrant of left female breast: Secondary | ICD-10-CM

## 2020-05-14 DIAGNOSIS — I10 Essential (primary) hypertension: Secondary | ICD-10-CM

## 2020-05-14 DIAGNOSIS — Z79899 Other long term (current) drug therapy: Secondary | ICD-10-CM | POA: Insufficient documentation

## 2020-05-14 DIAGNOSIS — R601 Generalized edema: Secondary | ICD-10-CM | POA: Insufficient documentation

## 2020-05-14 DIAGNOSIS — Z170 Estrogen receptor positive status [ER+]: Secondary | ICD-10-CM

## 2020-05-14 DIAGNOSIS — Z823 Family history of stroke: Secondary | ICD-10-CM | POA: Insufficient documentation

## 2020-05-14 DIAGNOSIS — Z6841 Body Mass Index (BMI) 40.0 and over, adult: Secondary | ICD-10-CM | POA: Diagnosis not present

## 2020-05-14 DIAGNOSIS — Z833 Family history of diabetes mellitus: Secondary | ICD-10-CM | POA: Diagnosis not present

## 2020-05-14 LAB — CBC WITH DIFFERENTIAL/PLATELET
Abs Immature Granulocytes: 0.02 10*3/uL (ref 0.00–0.07)
Basophils Absolute: 0 10*3/uL (ref 0.0–0.1)
Basophils Relative: 1 %
Eosinophils Absolute: 0.1 10*3/uL (ref 0.0–0.5)
Eosinophils Relative: 4 %
HCT: 23.5 % — ABNORMAL LOW (ref 36.0–46.0)
Hemoglobin: 7.9 g/dL — ABNORMAL LOW (ref 12.0–15.0)
Immature Granulocytes: 1 %
Lymphocytes Relative: 27 %
Lymphs Abs: 0.9 10*3/uL (ref 0.7–4.0)
MCH: 33.5 pg (ref 26.0–34.0)
MCHC: 33.6 g/dL (ref 30.0–36.0)
MCV: 99.6 fL (ref 80.0–100.0)
Monocytes Absolute: 0.8 10*3/uL (ref 0.1–1.0)
Monocytes Relative: 24 %
Neutro Abs: 1.5 10*3/uL — ABNORMAL LOW (ref 1.7–7.7)
Neutrophils Relative %: 43 %
Platelets: 158 10*3/uL (ref 150–400)
RBC: 2.36 MIL/uL — ABNORMAL LOW (ref 3.87–5.11)
RDW: 16.9 % — ABNORMAL HIGH (ref 11.5–15.5)
WBC: 3.3 10*3/uL — ABNORMAL LOW (ref 4.0–10.5)
nRBC: 0 % (ref 0.0–0.2)

## 2020-05-14 LAB — COMPREHENSIVE METABOLIC PANEL
ALT: 55 U/L — ABNORMAL HIGH (ref 0–44)
AST: 154 U/L — ABNORMAL HIGH (ref 15–41)
Albumin: 2.3 g/dL — ABNORMAL LOW (ref 3.5–5.0)
Alkaline Phosphatase: 109 U/L (ref 38–126)
Anion gap: 12 (ref 5–15)
BUN: 20 mg/dL (ref 8–23)
CO2: 23 mmol/L (ref 22–32)
Calcium: 8.2 mg/dL — ABNORMAL LOW (ref 8.9–10.3)
Chloride: 101 mmol/L (ref 98–111)
Creatinine, Ser: 2.3 mg/dL — ABNORMAL HIGH (ref 0.44–1.00)
GFR calc Af Amer: 25 mL/min — ABNORMAL LOW (ref >60)
GFR calc non Af Amer: 22 mL/min — ABNORMAL LOW (ref >60)
Glucose, Bld: 89 mg/dL (ref 70–99)
Potassium: 3.4 mmol/L — ABNORMAL LOW (ref 3.5–5.1)
Sodium: 136 mmol/L (ref 135–145)
Total Bilirubin: 0.6 mg/dL (ref 0.3–1.2)
Total Protein: 6.7 g/dL (ref 6.5–8.1)

## 2020-05-14 NOTE — Progress Notes (Signed)
[place]  [phone] [phone]     ID: [name] DOB: 08-May-1956  MR#: [id number]  JYN#:[id number]  Patient Care Team: [name], MD as PCP - General (Family Medicine) Mauro Kaufmann, RN as Oncology Nurse Navigator Rockwell Germany, RN as Oncology Nurse Navigator [name], MD as Consulting Physician (General Surgery) Hensley Treat, Virgie Dad, MD as Consulting Physician (Oncology) Kyung Rudd, MD as Consulting Physician (Radiation Oncology) Edrick Kins, DPM as Consulting Physician (Podiatry) Chauncey Cruel, MD OTHER MD:   CHIEF COMPLAINT: estrogen receptor positive breast cancer  CURRENT TREATMENT: anastrozole; radiation therapy   INTERVAL HISTORY: [name] today for follow up of her estrogen receptor positive breast cancer.   She continues on anastrozole.  She has had no problems with arthralgias or myalgias, hot flashes, or vaginal dryness issues.  She continues with radiation therapy, which started on 03/26/2020. She was scheduled to finish on 05/12/2020, but due to delays because of her continued significant skin breakdown, she is now scheduled to finish on 05/21/2020.  She tells me her skin is holding up well and she is not particularly tired.   REVIEW OF SYSTEMS: Antoine is back to her usual functional status.  She does a little bit of housework and little bit of cooking.  She is eating a little better.  She has good bladder control and no problems with incontinence.  The skin in her perineal area has completely healed over she says.  She is using creams over the radiation area and that is working for her.  She has noted a bulge in the left axilla area she wanted me to look at today.  A detailed review of systems was otherwise stable   HISTORY OF CURRENT ILLNESS: From the original intake note:  [name] (pronounced "yellow") had routine screening mammography on 10/24/2019 showing a possible abnormality in the left  breast. She underwent left breast ultrasonography at [facility] on 11/05/2019 showing: suspicious 3.8 cm mass in left breast at 3 o'clock; five abnormal lymph nodes.  Accordingly on 11/12/2019 she proceeded to biopsy of the left breast area in question as well as a lymph node. The pathology from this procedure ([id number]) showed: invasive ductal carcinoma with prominent inflammatory response, grade 3. Prognostic indicators significant for: estrogen receptor, 100% positive and progesterone receptor, 50% positive, both with strong staining intensity. Proliferation marker Ki67 at 70%. HER2 negative by immunohistochemistry (0).  The biopsied lymph node confirmed metastatic breast carcinoma.  The patient's subsequent history is as detailed below.   PAST MEDICAL HISTORY: Past Medical History:  Diagnosis Date  . Arthritis   . Cancer ([place]) 11/2019   Left breast  . Diverticulitis   . Family history of ovarian cancer   . Heart murmur    11/26/19 echo: Mild AS. AV mean gradient 12.0 mmHg; however, LVOT gradient 8 mmHg with intracvitary gradient-significant AS is not suspected  . Hypertension     PAST SURGICAL HISTORY: Past Surgical History:  Procedure Laterality Date  . MASTECTOMY WITH AXILLARY LYMPH NODE DISSECTION Left 12/19/2019   Procedure: LEFT MASTECTOMY WITH AXILLARY LYMPH NODE DISSECTION;  Surgeon: [name], MD;  Location: [place];  Service: General;  Laterality: Left;  Marland Kitchen MULTIPLE EXTRACTIONS WITH ALVEOLOPLASTY Bilateral 12/14/2019   Procedure: MULTIPLE EXTRACTION WITH ALVEOLOPLASTY;  Surgeon: [name], DDS;  Location: [place];  Service: Oral Surgery;  Laterality: Bilateral;  . PORTACATH PLACEMENT N/A 12/19/2019   Procedure: INSERTION PORT-A-CATH WITH ULTRASOUND GUIDANCE;  Surgeon: [name], MD;  Location[facility];  Service: General;  Laterality: N/A;  . RADIAL HEAD ARTHROPLASTY Right 06/25/2019   Procedure: RIGHT RADIAL HEAD ARTHROPLASTY;  Surgeon: [name], MD;   Location: [place];  Service: Orthopedics;  Laterality: Right;  . TUBAL LIGATION      FAMILY HISTORY: Family History  Problem Relation Age of Onset  . Hypertension Mother   . Dementia Mother   . Ovarian cancer Half-Sister 82  . Diabetes Half-Sister   . Stroke Half-Sister   Her father died at age 65; she is unsure of the cause of death. Her mother is age 33 as of 10/2019.  The patient has two brothers and four sisters. She reports ovarian cancer in her sister at age 65. She denies a family history of breast, prostate, or pancreatic cancer.   GYNECOLOGIC HISTORY:  No LMP recorded. Patient is postmenopausal. Menarche: 64 years old Age at first live birth: 64 years old Coin 2 LMP around age 78 Contraceptive: used previously HRT never used  Hysterectomy? no BSO? no   SOCIAL HISTORY: (updated 10/2019)  [name] retired from working at the [employer]. She is divorced. She lives at home alone but during the pandemic her sister [name] and mother [name] lived in the same home. [name], age 20, lives in [place] and is handicapped. [name], age 46, is a Biomedical scientist here in [place]. Verdie has two granddaughters. She attends a local [employer].    ADVANCED DIRECTIVES: not in place; at the 11/21/2019 visit the patient was given the appropriate documents to complete and notarized at her discretion.  She expressed spontaneously that she would not want to be resuscitated in case of a terminal event.    HEALTH MAINTENANCE: Social History   Tobacco Use  . Smoking status: Never Smoker  . Smokeless tobacco: Never Used  Vaping Use  . Vaping Use: Never used  Substance Use Topics  . Alcohol use: Yes    Alcohol/week: 3.0 standard drinks    Types: 1 Cans of beer, 2 Shots of liquor per week    Comment: daily  . Drug use: Yes    Types: Marijuana     Colonoscopy: never done  PAP: "1980?"  Bone density: never done   Allergies  Allergen Reactions  . Amlodipine Swelling    BLE edema     Current Outpatient Medications  Medication Sig Dispense Refill  . acetaminophen (TYLENOL) 500 MG tablet Take 500 mg by mouth as needed for mild pain.    Marland Kitchen anastrozole (ARIMIDEX) 1 MG tablet Take 1 tablet (1 mg total) by mouth daily. 90 tablet 4  . aspirin EC 81 MG EC tablet Take 1 tablet (81 mg total) by mouth daily. Swallow whole. 30 tablet 11  . Blood Pressure Monitor DEVI Use as instructed by provider to check bloos pressure daily. ICD: I10 1 each 0  . carvedilol (COREG) 12.5 MG tablet Take 1 tablet (12.5 mg total) by mouth 2 (two) times daily with a meal. 60 tablet 3  . folic acid (FOLVITE) 1 MG tablet Take 1 tablet (1 mg total) by mouth daily.    . furosemide (LASIX) 20 MG tablet TAKE 1 TABLET BY MOUTH EVERY DAY (Patient taking differently: Take 20 mg by mouth daily. ) 30 tablet 0  . hydrALAZINE (APRESOLINE) 25 MG tablet Take 1 tablet (25 mg total) by mouth every 8 (eight) hours. 90 tablet 3  . ibuprofen (ADVIL) 600 MG tablet Take 1 tablet (600 mg total) by  mouth every 12 (twelve) hours as needed. 60 tablet 1  . lidocaine-prilocaine (EMLA) cream Apply 1 application topically as needed. (Patient taking differently: Apply 1 application topically as needed (port). ) 30 g 0   No current facility-administered medications for this visit.    OBJECTIVE: African-American woman examined in a wheelchair  Vitals:   05/14/20 1205  BP: 96/65  Pulse: 85  Resp: 18  Temp: (!) 97.1 F (36.2 C)  SpO2: 100%     Body mass index is 57.69 kg/m.   Wt Readings from Last 3 Encounters:  05/14/20 (!) 305 lb 4.8 oz (138.5 kg)  04/28/20 300 lb (136.1 kg)  03/25/20 (!) 300 lb (136.1 kg)     ECOG FS:2 - Symptomatic, <50% confined to bed  Sclerae unicteric, EOMs intact Wearing a mask No cervical or supraclavicular adenopathy Lungs no rales or rhonchi Heart regular rate and rhythm Abd soft, nontender, positive bowel sounds MSK no focal spinal tenderness, no upper extremity lymphedema Neuro:  nonfocal, well oriented, appropriate affect Breasts: The left breast is status post mastectomy and is currently receiving radiation.  There is hyperpigmentation but no desquamation.  The bulge that she feels in the left axilla is the usual "dog ear" that occurs in patients with very voluminous breasts.  It is fatty tissue, not a seroma, not adenopathy, and not cancer.   LAB RESULTS:  CMP     Component Value Date/Time   NA 136 05/14/2020 1142   NA 135 01/02/2020 1710   K 3.4 (L) 05/14/2020 1142   CL 101 05/14/2020 1142   CO2 23 05/14/2020 1142   GLUCOSE 89 05/14/2020 1142   BUN 20 05/14/2020 1142   BUN 25 01/02/2020 1710   CREATININE 2.30 (H) 05/14/2020 1142   CREATININE 1.59 (H) 11/21/2019 0908   CALCIUM 8.2 (L) 05/14/2020 1142   PROT 6.7 05/14/2020 1142   PROT 8.5 06/15/2019 1622   ALBUMIN 2.3 (L) 05/14/2020 1142   ALBUMIN 4.3 06/15/2019 1622   AST 154 (H) 05/14/2020 1142   AST 61 (H) 11/21/2019 0908   ALT 55 (H) 05/14/2020 1142   ALT 32 11/21/2019 0908   ALKPHOS 109 05/14/2020 1142   BILITOT 0.6 05/14/2020 1142   BILITOT 0.5 11/21/2019 0908   GFRNONAA 22 (L) 05/14/2020 1142   GFRNONAA 34 (L) 11/21/2019 0908   GFRAA 25 (L) 05/14/2020 1142   GFRAA 39 (L) 11/21/2019 0908    No results found for: TOTALPROTELP, ALBUMINELP, A1GS, A2GS, BETS, BETA2SER, GAMS, MSPIKE, SPEI  Lab Results  Component Value Date   WBC 3.3 (L) 05/14/2020   NEUTROABS 1.5 (L) 05/14/2020   HGB 7.9 (L) 05/14/2020   HCT 23.5 (L) 05/14/2020   MCV 99.6 05/14/2020   PLT 158 05/14/2020    No results found for: LABCA2  No components found for: WPYKDX833  No results for input(s): INR in the last 168 hours.  No results found for: LABCA2  No results found for: ASN053  No results found for: ZJQ734  No results found for: LPF790  No results found for: CA2729  No components found for: HGQUANT  No results found for: CEA1 / No results found for: CEA1   No results found for: AFPTUMOR  No results  found for: CHROMOGRNA  No results found for: KPAFRELGTCHN, LAMBDASER, KAPLAMBRATIO (kappa/lambda light chains)  No results found for: HGBA, HGBA2QUANT, HGBFQUANT, HGBSQUAN (Hemoglobinopathy evaluation)   No results found for: LDH  Lab Results  Component Value Date   IRON 52 03/05/2020  TIBC 157 (L) 03/05/2020   IRONPCTSAT 33 (H) 03/05/2020   (Iron and TIBC)  Lab Results  Component Value Date   FERRITIN 861 (H) 03/05/2020    Urinalysis    Component Value Date/Time   COLORURINE YELLOW 04/29/2020 0614   APPEARANCEUR HAZY (A) 04/29/2020 0614   LABSPEC 1.006 04/29/2020 0614   PHURINE 5.0 04/29/2020 0614   GLUCOSEU NEGATIVE 04/29/2020 0614   HGBUR NEGATIVE 04/29/2020 0614   BILIRUBINUR NEGATIVE 04/29/2020 0614   KETONESUR NEGATIVE 04/29/2020 0614   PROTEINUR NEGATIVE 04/29/2020 0614   UROBILINOGEN 1.0 07/14/2012 1630   NITRITE NEGATIVE 04/29/2020 0614   LEUKOCYTESUR NEGATIVE 04/29/2020 3297     STUDIES: DG Chest 2 View  Result Date: 04/29/2020 CLINICAL DATA:  Anasarca, upper outer quadrant left breast cancer with lymph node metastases EXAM: CHEST - 2 VIEW COMPARISON:  02/10/2020 FINDINGS: Frontal and lateral views of the chest demonstrates stable right chest wall port via subclavian approach, tip within the superior vena cava. The cardiac silhouette is enlarged but stable. No acute airspace disease, effusion, or pneumothorax. Postsurgical changes within the left breast and axilla. No acute or destructive bony lesions. IMPRESSION: 1. Stable exam, no acute process. Electronically Signed   By: Sharlet Salina M.D.   On: 04/29/2020 03:52     ELIGIBLE FOR AVAILABLE RESEARCH PROTOCOL: AET  ASSESSMENT: 64 y.o. [place] woman status post left breast upper outer quadrant and left axillary lymph node biopsies 11/12/2019 for a clinical T2 N2, stage IIIA invasive ductal carcinoma, grade 3, estrogen and progesterone receptor positive, HER-2 not amplified, with an MIB-1 of 70%  (a)  the biopsied axillary lymph node was positive  (1) genetics testing 11/29/2019 through the Breast Cancer STAT Panel offered by [facility] found no deleterious mutations in ATM, BRCA1, BRCA2, CDH1, CHEK2, PALB2, PTEN, STK11 and TP53.  Additional testing through the Common Hereditary Cancers Panel confirmed no deleterious mutations in APC, ATM, AXIN2, BARD1, BMPR1A, BRCA1, BRCA2, BRIP1, CDH1, CDK4, CDKN2A (p14ARF), CDKN2A (p16INK4a), CHEK2, CTNNA1, DICER1, EPCAM (Deletion/duplication testing only), GREM1 (promoter region deletion/duplication testing only), KIT, MEN1, MLH1, MSH2, MSH3, MSH6, MUTYH, NBN, NF1, NHTL1, PALB2, PDGFRA, PMS2, POLD1, POLE, PTEN, RAD50, RAD51C, RAD51D, RNF43, SDHB, SDHC, SDHD, SMAD4, SMARCA4. STK11, TP53, TSC1, TSC2, and VHL.  The following genes were evaluated for sequence changes only: SDHA and HOXB13 c.251G>A variant only.  (2) status post left modified radical mastectomy 12/19/2019 for a pT2 pN1, stage IIB invasive ductal carcinoma, grade 3, with negative margins.  (a) 1 of 13 axillary lymph nodes removed was positive  (3) adjuvant chemotherapy will consist of cyclophosphamide and doxorubicin in dose dense fashion x4 starting 01/22/2020 followed by weekly paclitaxel x12  (a) baseline echocardiogram 11/26/2019 shows an ejection fraction in the 65-70% range.  (b) first cycle of cyclophosphamide and doxorubicin dose reduced to assess tolerance  (c) chemotherapy discontinued after 2 cycles because of poor tolerance  (4) adjuvant radiation ongoing, to be completed 05/21/2020  (5) anastrozole started July 2021   PLAN: Kloe is pretty much back to her baseline functional status.  She had significant difficulties with chemotherapy and no further chemotherapy is planned at this point.  Instead we are going to continue anastrozole for a minimum of 5 years.  She is tolerating this well.  She has a port in place and we are going to keep that since Axis otherwise can be difficult.   This means she will need a flush every 8 weeks.  I am not sure why her hemoglobin has dropped below  8.  When she returns tomorrow for additional radiation I am setting her up for a flush and and anemia work-up.  Otherwise she will return to see me in November.  She knows to call for any other issue that may develop before then  Total encounter time 20 minutes.Sarajane Jews C. Maginat, MD 05/14/20 12:27 PM Medical Oncology and Hematology [facility] [place], [place] [address] Tel. [phone]    Fax. [phone]   I, Wilburn Mylar, am acting as scribe for Dr. Virgie Dad. Grettel Rames.  I, Lurline Del MD, have reviewed the above documentation for accuracy and completeness, and I agree with the above.   *Total Encounter Time as defined by the Centers for Medicare and Medicaid Services includes, in addition to the face-to-face time of a patient visit (documented in the note above) non-face-to-face time: obtaining and reviewing outside history, ordering and reviewing medications, tests or procedures, care coordination (communications with other health care professionals or caregivers) and documentation in the medical record.

## 2020-05-15 ENCOUNTER — Telehealth: Payer: Self-pay | Admitting: Oncology

## 2020-05-15 ENCOUNTER — Other Ambulatory Visit: Payer: Self-pay

## 2020-05-15 ENCOUNTER — Inpatient Hospital Stay: Payer: Medicaid Other

## 2020-05-15 ENCOUNTER — Ambulatory Visit
Admission: RE | Admit: 2020-05-15 | Discharge: 2020-05-15 | Disposition: A | Discharge status: Home / Self Care | Payer: Medicaid Other | Source: Ambulatory Visit | Attending: Radiation Oncology | Admitting: Radiation Oncology

## 2020-05-15 ENCOUNTER — Ambulatory Visit: Payer: Medicaid Other

## 2020-05-15 DIAGNOSIS — Z95828 Presence of other vascular implants and grafts: Secondary | ICD-10-CM

## 2020-05-15 DIAGNOSIS — Z6841 Body Mass Index (BMI) 40.0 and over, adult: Secondary | ICD-10-CM

## 2020-05-15 DIAGNOSIS — R238 Other skin changes: Secondary | ICD-10-CM

## 2020-05-15 DIAGNOSIS — L98491 Non-pressure chronic ulcer of skin of other sites limited to breakdown of skin: Secondary | ICD-10-CM

## 2020-05-15 DIAGNOSIS — I10 Essential (primary) hypertension: Secondary | ICD-10-CM

## 2020-05-15 DIAGNOSIS — C50812 Malignant neoplasm of overlapping sites of left female breast: Secondary | ICD-10-CM | POA: Diagnosis not present

## 2020-05-15 DIAGNOSIS — C50412 Malignant neoplasm of upper-outer quadrant of left female breast: Secondary | ICD-10-CM

## 2020-05-15 DIAGNOSIS — Z170 Estrogen receptor positive status [ER+]: Secondary | ICD-10-CM

## 2020-05-15 LAB — COMPREHENSIVE METABOLIC PANEL
ALT: 55 U/L — ABNORMAL HIGH (ref 0–44)
AST: 130 U/L — ABNORMAL HIGH (ref 15–41)
Albumin: 2.5 g/dL — ABNORMAL LOW (ref 3.5–5.0)
Alkaline Phosphatase: 114 U/L (ref 38–126)
Anion gap: 15 (ref 5–15)
BUN: 21 mg/dL (ref 8–23)
CO2: 21 mmol/L — ABNORMAL LOW (ref 22–32)
Calcium: 8.5 mg/dL — ABNORMAL LOW (ref 8.9–10.3)
Chloride: 99 mmol/L (ref 98–111)
Creatinine, Ser: 2.77 mg/dL — ABNORMAL HIGH (ref 0.44–1.00)
GFR calc Af Amer: 20 mL/min — ABNORMAL LOW (ref >60)
GFR calc non Af Amer: 17 mL/min — ABNORMAL LOW (ref >60)
Glucose, Bld: 90 mg/dL (ref 70–99)
Potassium: 3.6 mmol/L (ref 3.5–5.1)
Sodium: 135 mmol/L (ref 135–145)
Total Bilirubin: 0.7 mg/dL (ref 0.3–1.2)
Total Protein: 7.2 g/dL (ref 6.5–8.1)

## 2020-05-15 LAB — CBC WITH DIFFERENTIAL/PLATELET
Abs Immature Granulocytes: 0.02 10*3/uL (ref 0.00–0.07)
Basophils Absolute: 0 10*3/uL (ref 0.0–0.1)
Basophils Relative: 1 %
Eosinophils Absolute: 0.1 10*3/uL (ref 0.0–0.5)
Eosinophils Relative: 4 %
HCT: 23.9 % — ABNORMAL LOW (ref 36.0–46.0)
Hemoglobin: 8.2 g/dL — ABNORMAL LOW (ref 12.0–15.0)
Immature Granulocytes: 1 %
Lymphocytes Relative: 26 %
Lymphs Abs: 0.8 10*3/uL (ref 0.7–4.0)
MCH: 34 pg (ref 26.0–34.0)
MCHC: 34.3 g/dL (ref 30.0–36.0)
MCV: 99.2 fL (ref 80.0–100.0)
Monocytes Absolute: 0.7 10*3/uL (ref 0.1–1.0)
Monocytes Relative: 21 %
Neutro Abs: 1.5 10*3/uL — ABNORMAL LOW (ref 1.7–7.7)
Neutrophils Relative %: 47 %
Platelets: 169 10*3/uL (ref 150–400)
RBC: 2.41 MIL/uL — ABNORMAL LOW (ref 3.87–5.11)
RDW: 16.7 % — ABNORMAL HIGH (ref 11.5–15.5)
WBC: 3.2 10*3/uL — ABNORMAL LOW (ref 4.0–10.5)
nRBC: 0 % (ref 0.0–0.2)

## 2020-05-15 LAB — FOLATE: Folate: 7.8 ng/mL (ref >5.9)

## 2020-05-15 LAB — RETICULOCYTES
Immature Retic Fract: 9.9 % (ref 2.3–15.9)
RBC.: 2.43 MIL/uL — ABNORMAL LOW (ref 3.87–5.11)
Retic Count, Absolute: 39.4 10*3/uL (ref 19.0–186.0)
Retic Ct Pct: 1.6 % (ref 0.4–3.1)

## 2020-05-15 LAB — LACTATE DEHYDROGENASE: LDH: 294 U/L — ABNORMAL HIGH (ref 98–192)

## 2020-05-15 LAB — VITAMIN B12: Vitamin B-12: 912 pg/mL (ref 180–914)

## 2020-05-15 MED ORDER — SODIUM CHLORIDE 0.9% FLUSH
10.0000 mL | Freq: Once | INTRAVENOUS | Status: AC
  Administered 2020-05-15: 10 mL
  Filled 2020-05-15: qty 10

## 2020-05-15 MED ORDER — HEPARIN SOD (PORK) LOCK FLUSH 100 UNIT/ML IV SOLN
250.0000 [IU] | Freq: Once | INTRAVENOUS | Status: AC
  Administered 2020-05-15: 500 [IU]
  Filled 2020-05-15: qty 5

## 2020-05-15 NOTE — Patient Instructions (Signed)

## 2020-05-15 NOTE — Telephone Encounter (Signed)
Scheduled appts per 9/15 los. Left voicemail with appt dates and times.

## 2020-05-16 ENCOUNTER — Ambulatory Visit
Admission: RE | Admit: 2020-05-16 | Discharge: 2020-05-16 | Disposition: A | Discharge status: Home / Self Care | Payer: Medicaid Other | Source: Ambulatory Visit | Attending: Radiation Oncology | Admitting: Radiation Oncology

## 2020-05-16 ENCOUNTER — Other Ambulatory Visit: Payer: Self-pay

## 2020-05-16 ENCOUNTER — Ambulatory Visit: Payer: Medicaid Other

## 2020-05-16 DIAGNOSIS — C50412 Malignant neoplasm of upper-outer quadrant of left female breast: Secondary | ICD-10-CM

## 2020-05-16 LAB — IRON AND TIBC
Iron: 136 ug/dL (ref 41–142)
Saturation Ratios: 97 % — ABNORMAL HIGH (ref 21–57)
TIBC: 140 ug/dL — ABNORMAL LOW (ref 236–444)
UIBC: 4 ug/dL — ABNORMAL LOW (ref 120–384)

## 2020-05-16 LAB — FERRITIN: Ferritin: 1025 ng/mL — ABNORMAL HIGH (ref 11–307)

## 2020-05-16 LAB — CANCER ANTIGEN 27.29: CA 27.29: 10.6 U/mL (ref 0.0–38.6)

## 2020-05-16 MED ORDER — SONAFINE EX EMUL
1.0000 "application " | Freq: Once | CUTANEOUS | Status: AC
  Administered 2020-05-16: 1 via TOPICAL

## 2020-05-19 ENCOUNTER — Ambulatory Visit
Admission: RE | Admit: 2020-05-19 | Discharge: 2020-05-19 | Disposition: A | Discharge status: Home / Self Care | Payer: Medicaid Other | Source: Ambulatory Visit | Attending: Radiation Oncology | Admitting: Radiation Oncology

## 2020-05-19 ENCOUNTER — Ambulatory Visit: Payer: Medicaid Other

## 2020-05-19 DIAGNOSIS — C50412 Malignant neoplasm of upper-outer quadrant of left female breast: Secondary | ICD-10-CM | POA: Diagnosis not present

## 2020-05-20 ENCOUNTER — Ambulatory Visit: Payer: Medicaid Other

## 2020-05-20 ENCOUNTER — Other Ambulatory Visit: Payer: Self-pay

## 2020-05-20 ENCOUNTER — Ambulatory Visit
Admission: RE | Admit: 2020-05-20 | Discharge: 2020-05-20 | Disposition: A | Discharge status: Home / Self Care | Payer: Medicaid Other | Source: Ambulatory Visit | Attending: Radiation Oncology | Admitting: Radiation Oncology

## 2020-05-20 DIAGNOSIS — C50412 Malignant neoplasm of upper-outer quadrant of left female breast: Secondary | ICD-10-CM | POA: Diagnosis not present

## 2020-05-21 ENCOUNTER — Encounter: Payer: Self-pay | Admitting: *Deleted

## 2020-05-21 ENCOUNTER — Encounter: Payer: Self-pay | Admitting: Radiation Oncology

## 2020-05-21 ENCOUNTER — Ambulatory Visit
Admission: RE | Admit: 2020-05-21 | Discharge: 2020-05-21 | Disposition: A | Discharge status: Home / Self Care | Payer: Medicaid Other | Source: Ambulatory Visit | Attending: Radiation Oncology | Admitting: Radiation Oncology

## 2020-05-21 ENCOUNTER — Other Ambulatory Visit: Payer: Self-pay

## 2020-05-21 DIAGNOSIS — C50412 Malignant neoplasm of upper-outer quadrant of left female breast: Secondary | ICD-10-CM | POA: Diagnosis not present

## 2020-05-22 ENCOUNTER — Encounter: Payer: Self-pay | Admitting: Obstetrics and Gynecology

## 2020-05-22 ENCOUNTER — Ambulatory Visit: Payer: Self-pay | Admitting: Obstetrics and Gynecology

## 2020-05-22 DIAGNOSIS — C50412 Malignant neoplasm of upper-outer quadrant of left female breast: Secondary | ICD-10-CM | POA: Diagnosis not present

## 2020-05-22 NOTE — Patient Outreach (Signed)
Care Coordination  05/22/2020  [name] November 28, 1955 [id number]  Attempted telephone outreach to [name], no answer.  No answer-voicemail left for patient.  Will attempt to reach patient again next week.  Telephone outreach rescheduled.  Aida Raider RN, BSN [place]  [employer] - Managed Medicaid High Risk [phone]

## 2020-05-26 ENCOUNTER — Telehealth: Payer: Self-pay | Admitting: *Deleted

## 2020-05-26 ENCOUNTER — Other Ambulatory Visit: Payer: Self-pay | Admitting: Family Medicine

## 2020-05-26 NOTE — Telephone Encounter (Signed)
This RN returned VM left by the patient stating " I have swelling all over and can barely stand up and walk "   Per phone discussion she states she was speaking to her personal care family doctor about these issues and it was noted Dr [name] placed a PT/ home health referral "  Note pt did not have PT or home health come to her home per above.  [name] states her current issue since being seen on 05/14/2020 by Dr [name] is " swelling all over except for my left arm "  ( note pt had left side breast cancer and surgery ).  She states the swelling is the worst in her thighs -   This RN discussed with pt that due to current issue with Covid and increased need for home health and PT - a lot of referrals are denied due to not having staff to provide the care.  This RN informed pt need to discuss her current issues with her primary MD due to known history of admission for cellulitis and sepsis not associated with the breast cancer.  [name] verbalized understanding.  This note will be forwarded to MD as well as the patient's primary care Dr [name] for review of communication and further follow up.

## 2020-05-26 NOTE — Telephone Encounter (Signed)
[name], can you ask [name] to help get patient scheduled with an appointment with an open provider this week?

## 2020-05-26 NOTE — Telephone Encounter (Signed)
No openings with any other providers this week, can we place pt on your schedule as a double book on Weds of this week?

## 2020-05-27 ENCOUNTER — Ambulatory Visit: Payer: Medicaid Other | Admitting: Oncology

## 2020-05-27 ENCOUNTER — Ambulatory Visit: Payer: Medicaid Other

## 2020-05-27 NOTE — Telephone Encounter (Signed)
ATC pt for scheduling, per PCP Cammie Fulp, MD, okay to schedule in 11:10am slot for Weds, 05/28/20 or Fri, 05/30/20. No answer, LM to RC.

## 2020-05-27 NOTE — Telephone Encounter (Signed)
I hate to double book because then everyone is behind schedule if both come in at same time but see if she can come in tomorrow at 11:10 or Friday at 11:10. Double book one of those slots

## 2020-05-29 ENCOUNTER — Other Ambulatory Visit: Payer: Self-pay

## 2020-05-29 NOTE — Patient Outreach (Signed)
Care Coordination  05/29/2020  [name] Nov 21, 1955 [id number]  A second unsuccessful telephone outreach was attempted today. The patient was referred to the case management team for assistance with care management and care coordination.   Follow Up Plan: The Managed Medicaid care management team will reach out to the patient again over the next 7 days.   Aida Raider RN, BSN [place]  [employer] - Managed Medicaid High Risk [phone]

## 2020-05-29 NOTE — Patient Instructions (Signed)
Visit Information  Ms. [name]  - as a part of your Medicaid benefit, you are eligible for care management and care coordination services at no cost or copay. I was unable to reach you by phone today but would be happy to help you with your health related needs. Please feel free to call me at [phone].  A member of the Managed Medicaid care management team will reach out to you again over the next 7 days.   Aida Raider RN, BSN [place]  [employer] - Managed Medicaid High Risk [phone].

## 2020-05-30 ENCOUNTER — Telehealth: Payer: Self-pay | Admitting: Family Medicine

## 2020-05-30 NOTE — Telephone Encounter (Signed)
Will route to PCP for review. 

## 2020-05-30 NOTE — Telephone Encounter (Signed)
She was not prescribed any medication at her visit with me on 05/12/20 because she had improved, had no ulcerations and stated she felt better. Please schedule with PCP- Dr Chapman Fitch for evaluation

## 2020-05-30 NOTE — Telephone Encounter (Signed)
Pt called stating that her cellulitis is really bad in both legs. She states that she saw Dr. [name] on 05/12/20 and was not prescribed any medication. She states that it has gotten so bad to the point that she cannot walk. Please advise.      [id number] [address], [place] [address]  Phone: [phone] Fax: [phone]  Hours: Not open 24 hours

## 2020-05-31 ENCOUNTER — Inpatient Hospital Stay (HOSPITAL_COMMUNITY)
Admission: EM | Admit: 2020-05-31 | Discharge: 2020-06-12 | DRG: 640 | Disposition: A | Discharge status: Home Health | Payer: Medicaid Other | Attending: Internal Medicine | Admitting: Internal Medicine

## 2020-05-31 ENCOUNTER — Encounter (HOSPITAL_COMMUNITY): Payer: Self-pay | Admitting: Emergency Medicine

## 2020-05-31 ENCOUNTER — Emergency Department (HOSPITAL_COMMUNITY): Payer: Medicaid Other

## 2020-05-31 ENCOUNTER — Other Ambulatory Visit: Payer: Self-pay

## 2020-05-31 DIAGNOSIS — D649 Anemia, unspecified: Secondary | ICD-10-CM

## 2020-05-31 DIAGNOSIS — D6959 Other secondary thrombocytopenia: Secondary | ICD-10-CM | POA: Diagnosis not present

## 2020-05-31 DIAGNOSIS — R5381 Other malaise: Secondary | ICD-10-CM | POA: Diagnosis present

## 2020-05-31 DIAGNOSIS — N1831 Chronic kidney disease, stage 3a: Secondary | ICD-10-CM

## 2020-05-31 DIAGNOSIS — L97929 Non-pressure chronic ulcer of unspecified part of left lower leg with unspecified severity: Secondary | ICD-10-CM | POA: Diagnosis present

## 2020-05-31 DIAGNOSIS — I361 Nonrheumatic tricuspid (valve) insufficiency: Secondary | ICD-10-CM | POA: Diagnosis not present

## 2020-05-31 DIAGNOSIS — I10 Essential (primary) hypertension: Secondary | ICD-10-CM | POA: Diagnosis not present

## 2020-05-31 DIAGNOSIS — I872 Venous insufficiency (chronic) (peripheral): Secondary | ICD-10-CM | POA: Diagnosis present

## 2020-05-31 DIAGNOSIS — E162 Hypoglycemia, unspecified: Secondary | ICD-10-CM | POA: Diagnosis not present

## 2020-05-31 DIAGNOSIS — C50919 Malignant neoplasm of unspecified site of unspecified female breast: Secondary | ICD-10-CM | POA: Diagnosis present

## 2020-05-31 DIAGNOSIS — Z170 Estrogen receptor positive status [ER+]: Secondary | ICD-10-CM | POA: Diagnosis not present

## 2020-05-31 DIAGNOSIS — Z833 Family history of diabetes mellitus: Secondary | ICD-10-CM

## 2020-05-31 DIAGNOSIS — Z20822 Contact with and (suspected) exposure to covid-19: Secondary | ICD-10-CM | POA: Diagnosis present

## 2020-05-31 DIAGNOSIS — M1990 Unspecified osteoarthritis, unspecified site: Secondary | ICD-10-CM | POA: Diagnosis present

## 2020-05-31 DIAGNOSIS — C773 Secondary and unspecified malignant neoplasm of axilla and upper limb lymph nodes: Secondary | ICD-10-CM | POA: Diagnosis present

## 2020-05-31 DIAGNOSIS — Z9012 Acquired absence of left breast and nipple: Secondary | ICD-10-CM

## 2020-05-31 DIAGNOSIS — M7989 Other specified soft tissue disorders: Secondary | ICD-10-CM | POA: Diagnosis not present

## 2020-05-31 DIAGNOSIS — Z888 Allergy status to other drugs, medicaments and biological substances status: Secondary | ICD-10-CM

## 2020-05-31 DIAGNOSIS — Z79811 Long term (current) use of aromatase inhibitors: Secondary | ICD-10-CM

## 2020-05-31 DIAGNOSIS — C50412 Malignant neoplasm of upper-outer quadrant of left female breast: Secondary | ICD-10-CM | POA: Diagnosis present

## 2020-05-31 DIAGNOSIS — I130 Hypertensive heart and chronic kidney disease with heart failure and stage 1 through stage 4 chronic kidney disease, or unspecified chronic kidney disease: Secondary | ICD-10-CM | POA: Diagnosis present

## 2020-05-31 DIAGNOSIS — E161 Other hypoglycemia: Secondary | ICD-10-CM | POA: Diagnosis not present

## 2020-05-31 DIAGNOSIS — Z7952 Long term (current) use of systemic steroids: Secondary | ICD-10-CM

## 2020-05-31 DIAGNOSIS — Z823 Family history of stroke: Secondary | ICD-10-CM

## 2020-05-31 DIAGNOSIS — D61818 Other pancytopenia: Secondary | ICD-10-CM

## 2020-05-31 DIAGNOSIS — E876 Hypokalemia: Secondary | ICD-10-CM | POA: Diagnosis not present

## 2020-05-31 DIAGNOSIS — D696 Thrombocytopenia, unspecified: Secondary | ICD-10-CM

## 2020-05-31 DIAGNOSIS — D631 Anemia in chronic kidney disease: Secondary | ICD-10-CM | POA: Diagnosis present

## 2020-05-31 DIAGNOSIS — L0390 Cellulitis, unspecified: Secondary | ICD-10-CM | POA: Diagnosis not present

## 2020-05-31 DIAGNOSIS — D61810 Antineoplastic chemotherapy induced pancytopenia: Secondary | ICD-10-CM | POA: Diagnosis present

## 2020-05-31 DIAGNOSIS — Z6841 Body Mass Index (BMI) 40.0 and over, adult: Secondary | ICD-10-CM

## 2020-05-31 DIAGNOSIS — N183 Chronic kidney disease, stage 3 unspecified: Secondary | ICD-10-CM | POA: Diagnosis not present

## 2020-05-31 DIAGNOSIS — N1832 Chronic kidney disease, stage 3b: Secondary | ICD-10-CM | POA: Diagnosis present

## 2020-05-31 DIAGNOSIS — Z7982 Long term (current) use of aspirin: Secondary | ICD-10-CM

## 2020-05-31 DIAGNOSIS — R609 Edema, unspecified: Secondary | ICD-10-CM | POA: Diagnosis not present

## 2020-05-31 DIAGNOSIS — Z79899 Other long term (current) drug therapy: Secondary | ICD-10-CM

## 2020-05-31 DIAGNOSIS — N170 Acute kidney failure with tubular necrosis: Secondary | ICD-10-CM | POA: Diagnosis present

## 2020-05-31 DIAGNOSIS — C50912 Malignant neoplasm of unspecified site of left female breast: Secondary | ICD-10-CM | POA: Diagnosis not present

## 2020-05-31 DIAGNOSIS — Z923 Personal history of irradiation: Secondary | ICD-10-CM

## 2020-05-31 DIAGNOSIS — D638 Anemia in other chronic diseases classified elsewhere: Secondary | ICD-10-CM | POA: Diagnosis not present

## 2020-05-31 DIAGNOSIS — N189 Chronic kidney disease, unspecified: Secondary | ICD-10-CM | POA: Diagnosis not present

## 2020-05-31 DIAGNOSIS — F1020 Alcohol dependence, uncomplicated: Secondary | ICD-10-CM

## 2020-05-31 DIAGNOSIS — I350 Nonrheumatic aortic (valve) stenosis: Secondary | ICD-10-CM | POA: Diagnosis not present

## 2020-05-31 DIAGNOSIS — E871 Hypo-osmolality and hyponatremia: Principal | ICD-10-CM

## 2020-05-31 DIAGNOSIS — E875 Hyperkalemia: Secondary | ICD-10-CM | POA: Diagnosis not present

## 2020-05-31 DIAGNOSIS — L97919 Non-pressure chronic ulcer of unspecified part of right lower leg with unspecified severity: Secondary | ICD-10-CM | POA: Diagnosis present

## 2020-05-31 DIAGNOSIS — Z8249 Family history of ischemic heart disease and other diseases of the circulatory system: Secondary | ICD-10-CM

## 2020-05-31 DIAGNOSIS — I5033 Acute on chronic diastolic (congestive) heart failure: Secondary | ICD-10-CM | POA: Diagnosis not present

## 2020-05-31 DIAGNOSIS — Z993 Dependence on wheelchair: Secondary | ICD-10-CM

## 2020-05-31 DIAGNOSIS — R011 Cardiac murmur, unspecified: Secondary | ICD-10-CM | POA: Diagnosis present

## 2020-05-31 DIAGNOSIS — N179 Acute kidney failure, unspecified: Secondary | ICD-10-CM | POA: Diagnosis not present

## 2020-05-31 DIAGNOSIS — T39395A Adverse effect of other nonsteroidal anti-inflammatory drugs [NSAID], initial encounter: Secondary | ICD-10-CM | POA: Diagnosis present

## 2020-05-31 DIAGNOSIS — Z8041 Family history of malignant neoplasm of ovary: Secondary | ICD-10-CM

## 2020-05-31 DIAGNOSIS — E861 Hypovolemia: Secondary | ICD-10-CM | POA: Diagnosis present

## 2020-05-31 DIAGNOSIS — Z9221 Personal history of antineoplastic chemotherapy: Secondary | ICD-10-CM

## 2020-05-31 DIAGNOSIS — M79605 Pain in left leg: Secondary | ICD-10-CM | POA: Diagnosis not present

## 2020-05-31 DIAGNOSIS — E869 Volume depletion, unspecified: Secondary | ICD-10-CM | POA: Diagnosis present

## 2020-05-31 DIAGNOSIS — R601 Generalized edema: Secondary | ICD-10-CM | POA: Diagnosis not present

## 2020-05-31 DIAGNOSIS — K709 Alcoholic liver disease, unspecified: Secondary | ICD-10-CM | POA: Diagnosis present

## 2020-05-31 LAB — CBC WITH DIFFERENTIAL/PLATELET
Abs Immature Granulocytes: 0.02 10*3/uL (ref 0.00–0.07)
Basophils Absolute: 0 10*3/uL (ref 0.0–0.1)
Basophils Relative: 0 %
Eosinophils Absolute: 0.1 10*3/uL (ref 0.0–0.5)
Eosinophils Relative: 2 %
HCT: 21.8 % — ABNORMAL LOW (ref 36.0–46.0)
Hemoglobin: 7.9 g/dL — ABNORMAL LOW (ref 12.0–15.0)
Immature Granulocytes: 1 %
Lymphocytes Relative: 16 %
Lymphs Abs: 0.6 10*3/uL — ABNORMAL LOW (ref 0.7–4.0)
MCH: 33.9 pg (ref 26.0–34.0)
MCHC: 36.2 g/dL — ABNORMAL HIGH (ref 30.0–36.0)
MCV: 93.6 fL (ref 80.0–100.0)
Monocytes Absolute: 0.6 10*3/uL (ref 0.1–1.0)
Monocytes Relative: 15 %
Neutro Abs: 2.5 10*3/uL (ref 1.7–7.7)
Neutrophils Relative %: 66 %
Platelets: 134 10*3/uL — ABNORMAL LOW (ref 150–400)
RBC: 2.33 MIL/uL — ABNORMAL LOW (ref 3.87–5.11)
RDW: 14.2 % (ref 11.5–15.5)
WBC: 3.8 10*3/uL — ABNORMAL LOW (ref 4.0–10.5)
nRBC: 0 % (ref 0.0–0.2)

## 2020-05-31 LAB — URINALYSIS, ROUTINE W REFLEX MICROSCOPIC
Bilirubin Urine: NEGATIVE
Glucose, UA: NEGATIVE mg/dL
Hgb urine dipstick: NEGATIVE
Ketones, ur: NEGATIVE mg/dL
Leukocytes,Ua: NEGATIVE
Nitrite: NEGATIVE
Protein, ur: 100 mg/dL — AB
Specific Gravity, Urine: 1.012 (ref 1.005–1.030)
pH: 5 (ref 5.0–8.0)

## 2020-05-31 LAB — MAGNESIUM: Magnesium: 1.6 mg/dL — ABNORMAL LOW (ref 1.7–2.4)

## 2020-05-31 LAB — COMPREHENSIVE METABOLIC PANEL
ALT: 28 U/L (ref 0–44)
AST: 48 U/L — ABNORMAL HIGH (ref 15–41)
Albumin: 2.7 g/dL — ABNORMAL LOW (ref 3.5–5.0)
Alkaline Phosphatase: 94 U/L (ref 38–126)
Anion gap: 17 — ABNORMAL HIGH (ref 5–15)
BUN: 51 mg/dL — ABNORMAL HIGH (ref 8–23)
CO2: 14 mmol/L — ABNORMAL LOW (ref 22–32)
Calcium: 8.1 mg/dL — ABNORMAL LOW (ref 8.9–10.3)
Chloride: 88 mmol/L — ABNORMAL LOW (ref 98–111)
Creatinine, Ser: 4.49 mg/dL — ABNORMAL HIGH (ref 0.44–1.00)
GFR calc Af Amer: 11 mL/min — ABNORMAL LOW (ref >60)
GFR calc non Af Amer: 10 mL/min — ABNORMAL LOW (ref >60)
Glucose, Bld: 69 mg/dL — ABNORMAL LOW (ref 70–99)
Potassium: 3.5 mmol/L (ref 3.5–5.1)
Sodium: 119 mmol/L — CL (ref 135–145)
Total Bilirubin: 0.9 mg/dL (ref 0.3–1.2)
Total Protein: 6.9 g/dL (ref 6.5–8.1)

## 2020-05-31 LAB — BASIC METABOLIC PANEL
Anion gap: 19 — ABNORMAL HIGH (ref 5–15)
BUN: 48 mg/dL — ABNORMAL HIGH (ref 8–23)
CO2: 15 mmol/L — ABNORMAL LOW (ref 22–32)
Calcium: 8.4 mg/dL — ABNORMAL LOW (ref 8.9–10.3)
Chloride: 89 mmol/L — ABNORMAL LOW (ref 98–111)
Creatinine, Ser: 4.32 mg/dL — ABNORMAL HIGH (ref 0.44–1.00)
GFR calc Af Amer: 12 mL/min — ABNORMAL LOW (ref >60)
GFR calc non Af Amer: 10 mL/min — ABNORMAL LOW (ref >60)
Glucose, Bld: 67 mg/dL — ABNORMAL LOW (ref 70–99)
Potassium: 3.6 mmol/L (ref 3.5–5.1)
Sodium: 123 mmol/L — ABNORMAL LOW (ref 135–145)

## 2020-05-31 LAB — OSMOLALITY: Osmolality: 270 mOsm/kg — ABNORMAL LOW (ref 275–295)

## 2020-05-31 LAB — LACTIC ACID, PLASMA: Lactic Acid, Venous: 1 mmol/L (ref 0.5–1.9)

## 2020-05-31 LAB — PROTIME-INR
INR: 1.2 (ref 0.8–1.2)
Prothrombin Time: 14.3 seconds (ref 11.4–15.2)

## 2020-05-31 LAB — CREATININE, URINE, RANDOM: Creatinine, Urine: 175.69 mg/dL

## 2020-05-31 LAB — RESPIRATORY PANEL BY RT PCR (FLU A&B, COVID)
Influenza A by PCR: NEGATIVE
Influenza B by PCR: NEGATIVE
SARS Coronavirus 2 by RT PCR: NEGATIVE

## 2020-05-31 LAB — CBG MONITORING, ED
Glucose-Capillary: 68 mg/dL — ABNORMAL LOW (ref 70–99)
Glucose-Capillary: 77 mg/dL (ref 70–99)

## 2020-05-31 LAB — SODIUM, URINE, RANDOM: Sodium, Ur: <10 mmol/L

## 2020-05-31 LAB — TSH: TSH: 4.089 u[IU]/mL (ref 0.350–4.500)

## 2020-05-31 LAB — AMMONIA: Ammonia: 39 umol/L — ABNORMAL HIGH (ref 9–35)

## 2020-05-31 LAB — PHOSPHORUS: Phosphorus: 6.2 mg/dL — ABNORMAL HIGH (ref 2.5–4.6)

## 2020-05-31 IMAGING — DX DG CHEST 1V PORT
1 series · 1 of 1 positions shown · non-contrast
Comparison: [DATE]

CLINICAL DATA: Left arm and hand swelling for several days.

EXAM:
PORTABLE CHEST 1 VIEW

[chest ap]
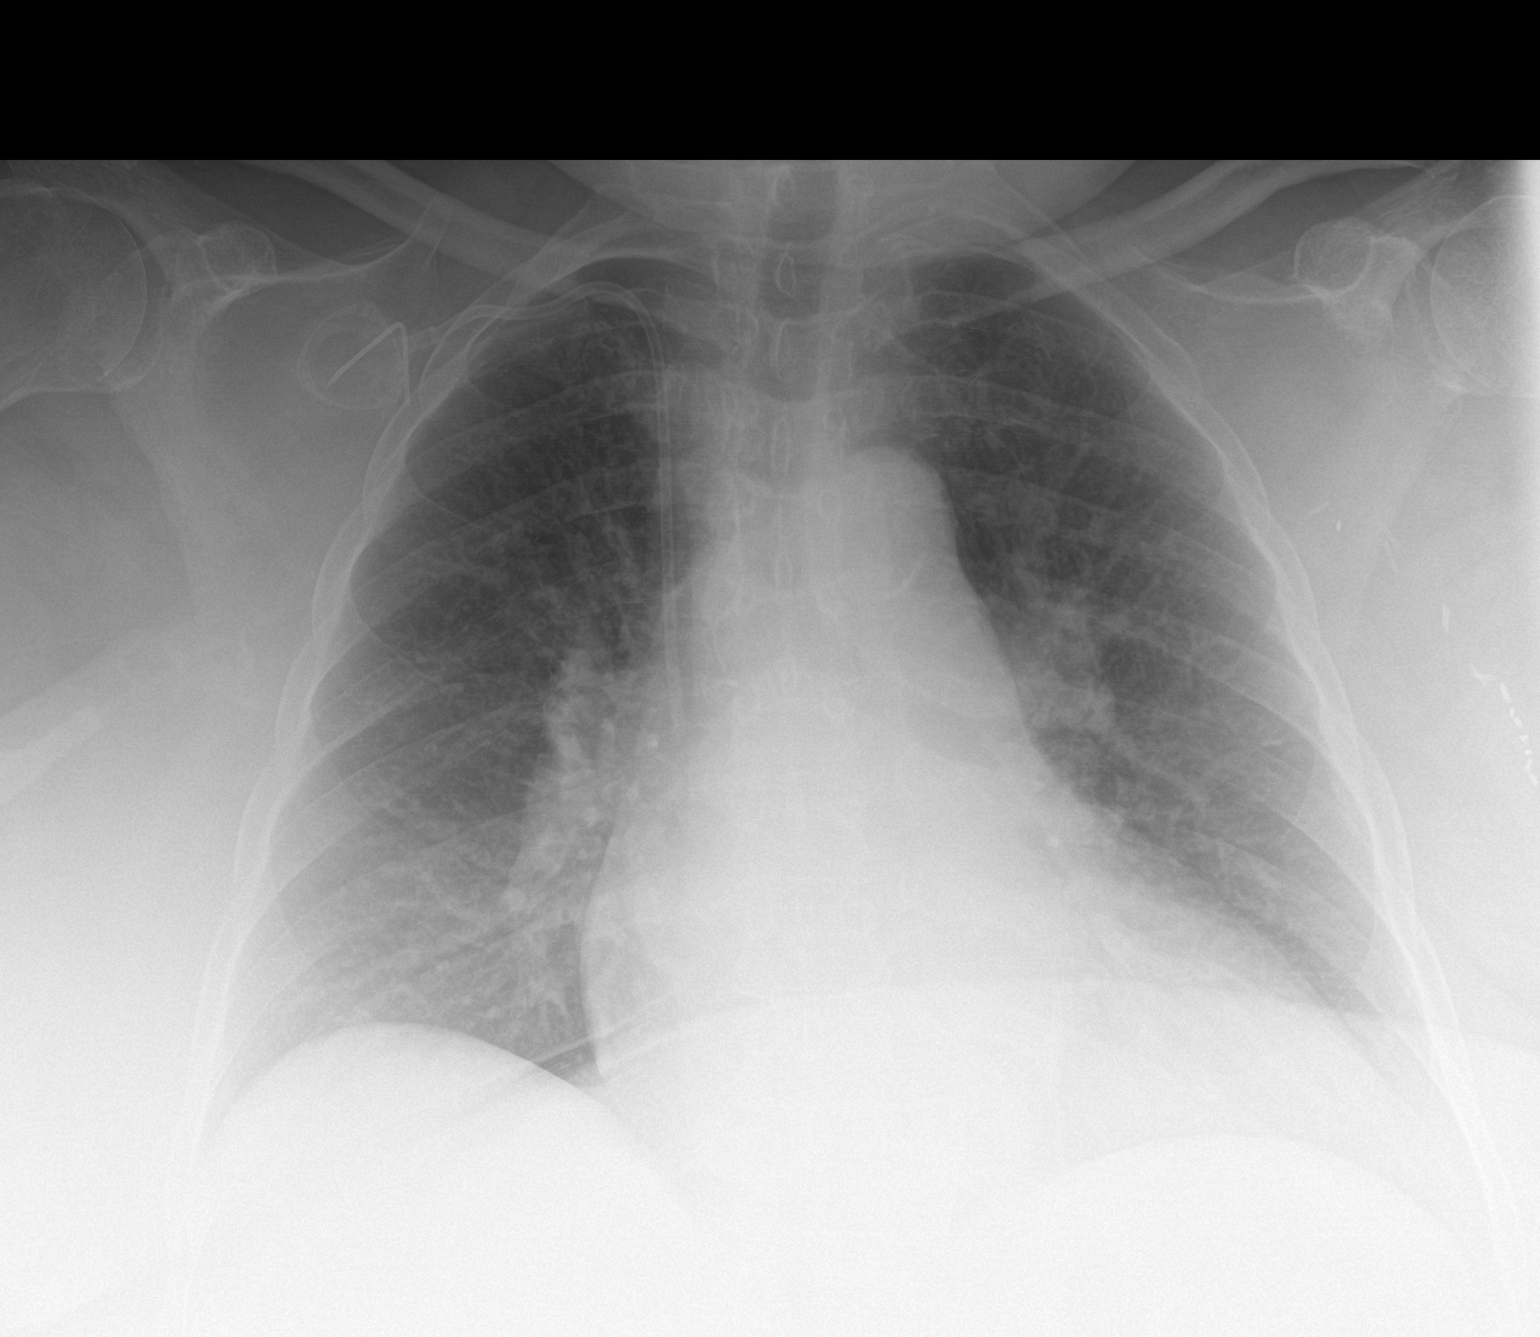

[1 of 1 positions shown; findings below may reference images not displayed]

FINDINGS: There is a stable right-sided subclavian Port-A-Cath. The heart size
remains enlarged but stable from prior study. Aortic calcifications
are noted. There is probable mild vascular congestion without overt
pulmonary edema. There is no pneumothorax. Surgical clips are noted
in the patient's left axilla which may related to prior lymph node
dissection.
IMPRESSION: Cardiomegaly with mild pulmonary vascular congestion. No overt
pulmonary edema.

## 2020-05-31 IMAGING — US US RENAL
1 series · 14 of 23 positions shown · non-contrast
Comparison: CT abdomen pelvis [DATE]

CLINICAL DATA: Acute kidney injury.

EXAM:
RENAL / URINARY TRACT ULTRASOUND COMPLETE

[Series 1: us renal · 14 of 23 slices shown]
[im 1/23]
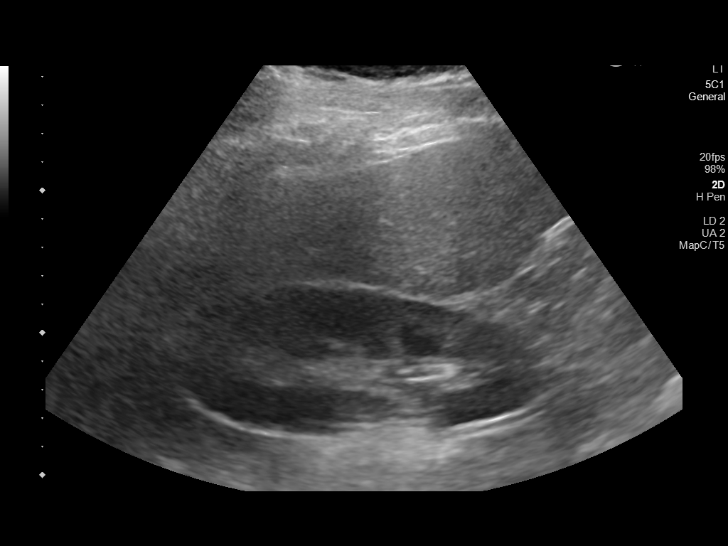
[im 3/23]
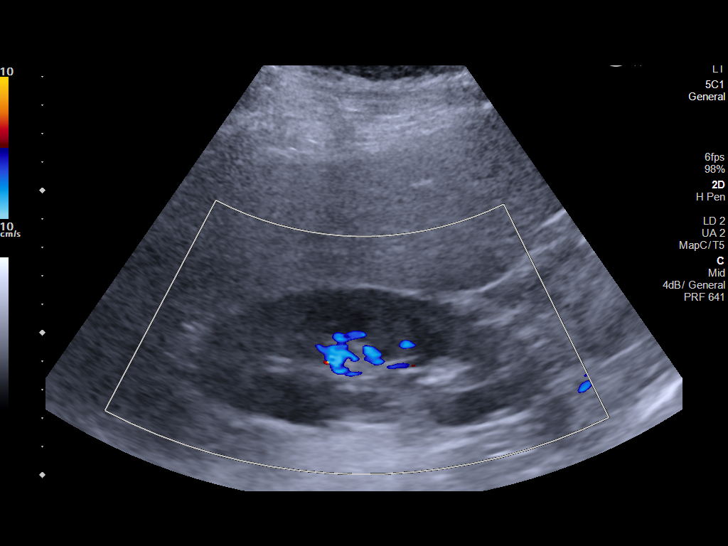
[im 5/23]
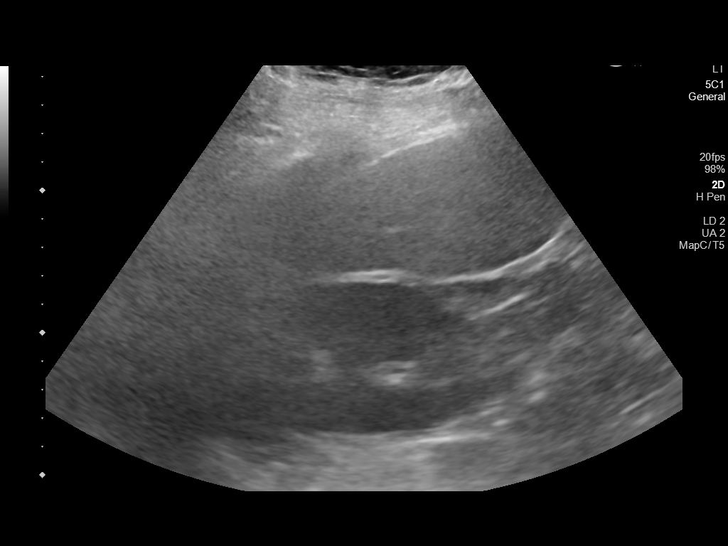
[im 6/23]
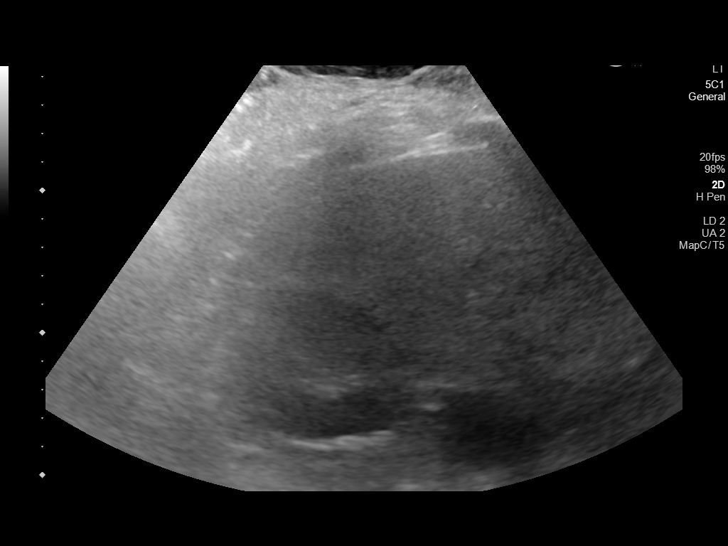
[im 8/23]
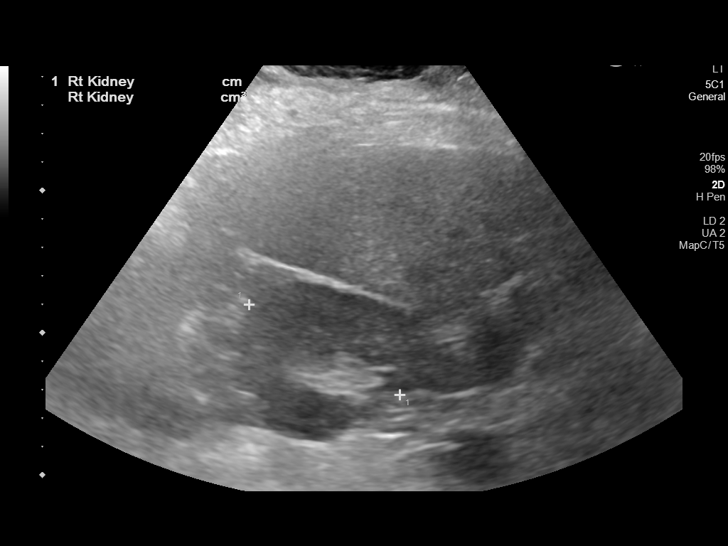
[im 10/23]
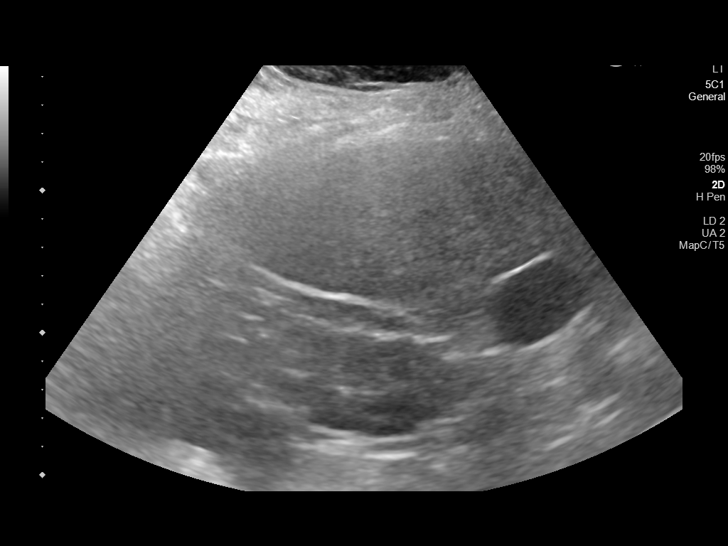
[im 11/23]
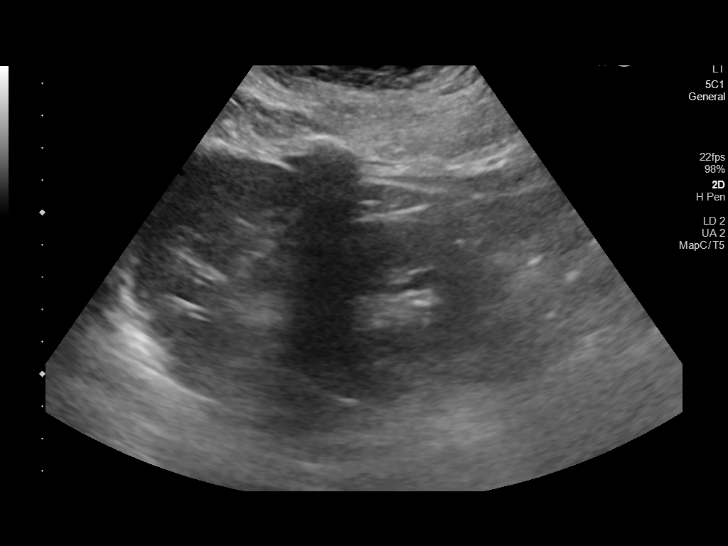
[im 13/23]
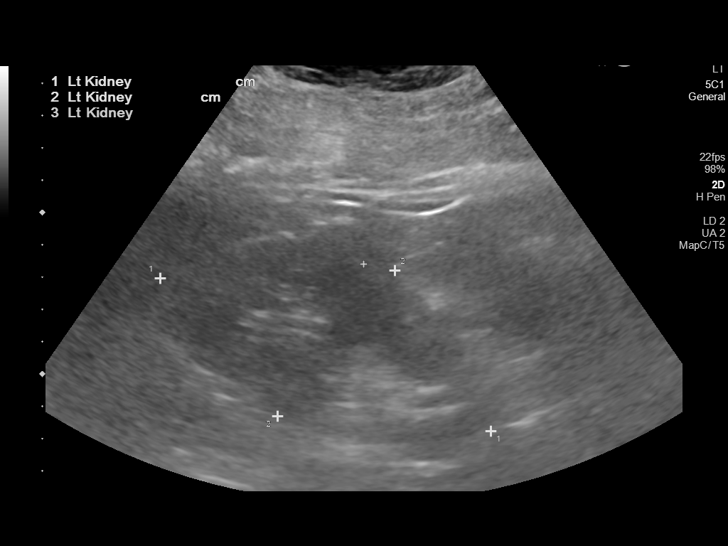
[im 14/23]
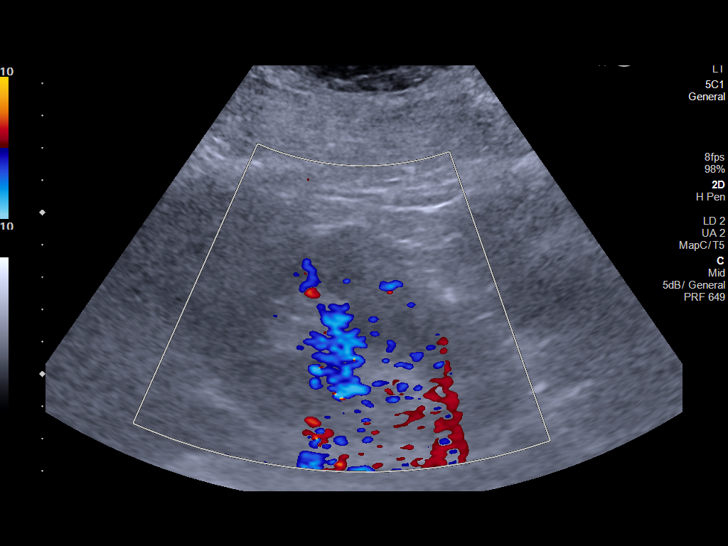
[im 16/23]
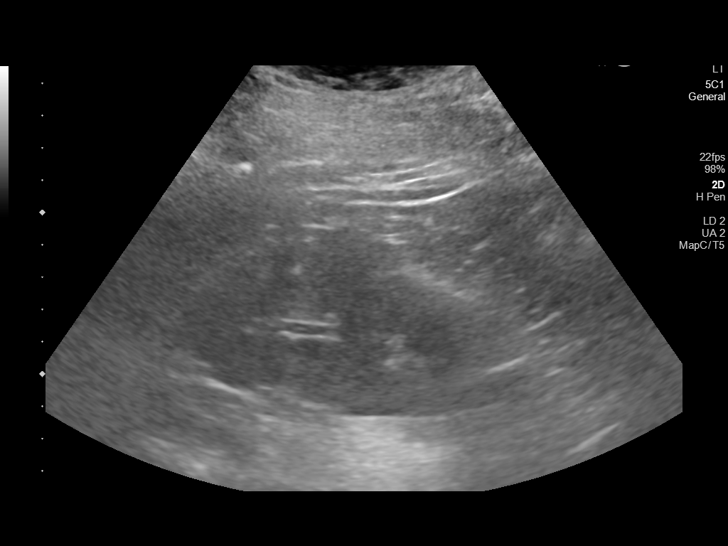
[im 18/23]
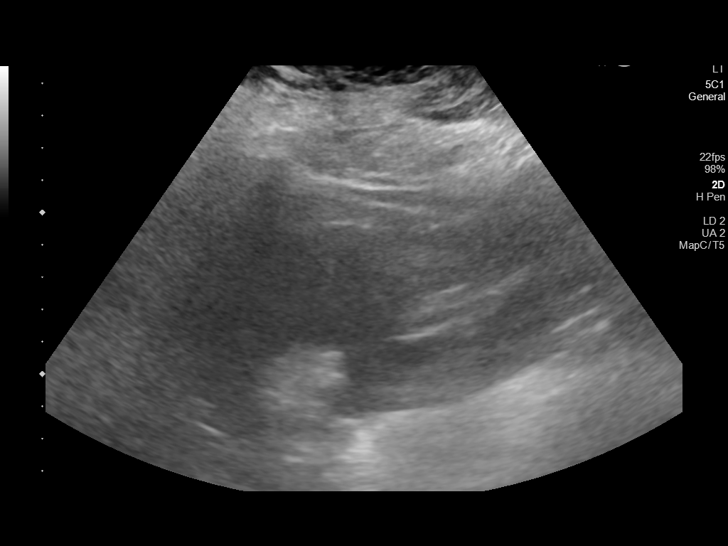
[im 19/23]
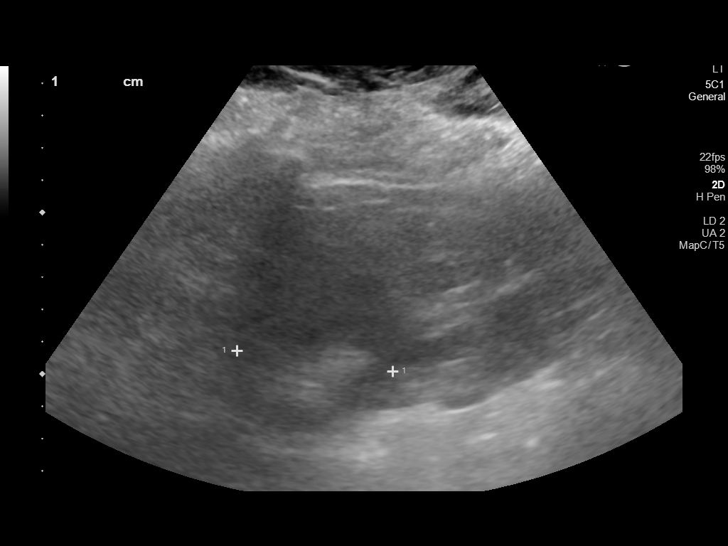
[im 21/23]
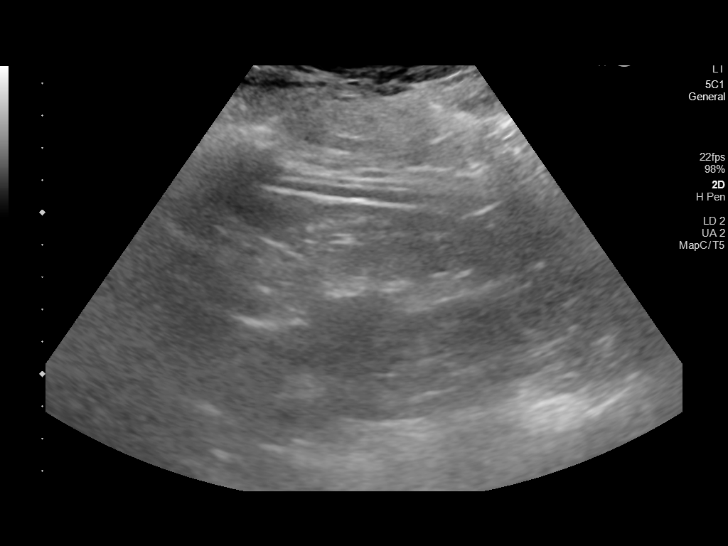
[im 23/23]
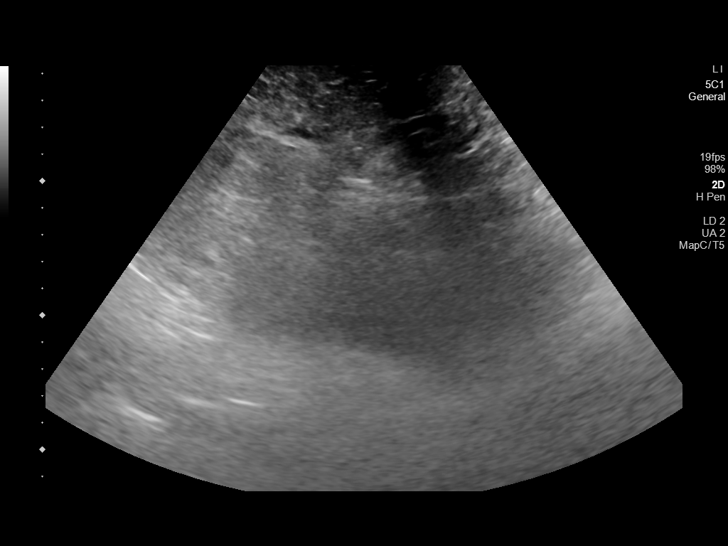

[14 of 23 positions shown; findings below may reference images not displayed]

FINDINGS: Right Kidney:

Renal measurements: 13.3 x 5.3 x 6.2 cm = volume: 226 mL.
Echogenicity within normal limits. No mass or hydronephrosis
visualized.

Left Kidney:

Renal measurements: 11.3 x 5.8 x 4.9 cm = volume: 185 mL. Poor
visualization secondary to patient body habitus. No definite
hydronephrosis, renal calculus or renal mass.

Bladder:

Not visualized.

Other:

None.
IMPRESSION: Limited evaluation particularly of the left kidney due to patient
body habitus. No definite acute finding.

## 2020-05-31 MED ORDER — SODIUM CHLORIDE 0.9% FLUSH: 3.0000 mL | INTRAVENOUS | Status: DC | PRN

## 2020-05-31 MED ORDER — LORAZEPAM 2 MG/ML IJ SOLN: 1.0000 mg | INTRAMUSCULAR | Status: AC | PRN

## 2020-05-31 MED ORDER — MAGNESIUM SULFATE IN D5W 1-5 GM/100ML-% IV SOLN
1.0000 g | Freq: Once | INTRAVENOUS | Status: AC
  Administered 2020-05-31: 1 g via INTRAVENOUS
  Filled 2020-05-31: qty 100

## 2020-05-31 MED ORDER — SODIUM CHLORIDE 0.9% FLUSH
3.0000 mL | Freq: Two times a day (BID) | INTRAVENOUS | Status: DC
  Administered 2020-06-01 – 2020-06-06 (×6): 3 mL via INTRAVENOUS

## 2020-05-31 MED ORDER — SODIUM CHLORIDE 0.9 % IV SOLN: INTRAVENOUS | Status: AC | PRN

## 2020-05-31 MED ORDER — SODIUM CHLORIDE 0.9 % IV SOLN: Freq: Once | INTRAVENOUS | Status: AC

## 2020-05-31 MED ORDER — FOLIC ACID 1 MG PO TABS
1.0000 mg | ORAL_TABLET | Freq: Every day | ORAL | Status: DC
  Administered 2020-05-31: 1 mg via ORAL
  Filled 2020-05-31: qty 1

## 2020-05-31 MED ORDER — NYSTATIN 100000 UNIT/GM EX POWD
Freq: Three times a day (TID) | CUTANEOUS | Status: DC
  Filled 2020-05-31 (×3): qty 15

## 2020-05-31 MED ORDER — THIAMINE HCL 100 MG PO TABS
100.0000 mg | ORAL_TABLET | Freq: Every day | ORAL | Status: DC
  Administered 2020-05-31 – 2020-06-12 (×12): 100 mg via ORAL
  Filled 2020-05-31 (×13): qty 1

## 2020-05-31 MED ORDER — ALBUMIN HUMAN 25 % IV SOLN
25.0000 g | Freq: Three times a day (TID) | INTRAVENOUS | Status: DC
  Administered 2020-05-31 – 2020-06-01 (×2): 25 g via INTRAVENOUS
  Filled 2020-05-31 (×3): qty 100

## 2020-05-31 MED ORDER — ALBUMIN HUMAN 25 % IV SOLN: 25.0000 g | Freq: Once | INTRAVENOUS | Status: DC

## 2020-05-31 MED ORDER — ADULT MULTIVITAMIN W/MINERALS CH
1.0000 | ORAL_TABLET | Freq: Every day | ORAL | Status: DC
  Administered 2020-05-31 – 2020-06-12 (×13): 1 via ORAL
  Filled 2020-05-31 (×14): qty 1

## 2020-05-31 MED ORDER — LORAZEPAM 1 MG PO TABS: 1.0000 mg | ORAL_TABLET | ORAL | Status: AC | PRN

## 2020-05-31 MED ORDER — SODIUM CHLORIDE 0.9 % IV SOLN: 250.0000 mL | INTRAVENOUS | Status: DC | PRN

## 2020-05-31 MED ORDER — THIAMINE HCL 100 MG/ML IJ SOLN
100.0000 mg | Freq: Every day | INTRAMUSCULAR | Status: DC
  Administered 2020-06-04: 100 mg via INTRAVENOUS
  Filled 2020-05-31 (×3): qty 2

## 2020-05-31 NOTE — ED Provider Notes (Addendum)
[facility] DEPT Provider Note   CSN: [id number] Arrival date & time: 05/31/20  1306     History Chief Complaint  Patient presents with  . Leg Swelling    [name] is a 64 y.o. female.  Chief complaint left arm and hand swelling for several days.  She has a history of morbid obesity and left breast cancer, status post left mastectomy this year.  No shortness of breath or chest pain.  Legs are chronically swollen.  She is supposed to be on and "antibiotic for leg cellulitis", but this has not been started.  No fever, sweats, chills severity is moderate.  Palpation makes pain worse.        Past Medical History:  Diagnosis Date  . Arthritis   . Cancer ([place]) 11/2019   Left breast  . Diverticulitis   . Family history of ovarian cancer   . Heart murmur    11/26/19 echo: Mild AS. AV mean gradient 12.0 mmHg; however, LVOT gradient 8 mmHg with intracvitary gradient-significant AS is not suspected  . Hypertension     Patient Active Problem List   Diagnosis Date Noted  . Cellulitis of thigh 04/29/2020  . Tinea corporis 04/29/2020  . Anemia of chronic disease 04/29/2020  . Chronic kidney disease, stage 3a ([place]) 04/29/2020  . Cellulitis of abdominal wall   . Constipation   . Folate deficiency   . Hypomagnesemia   . Debility   . Gout 03/03/2020  . Skin breakdown 03/03/2020  . Skin ulcer of perineum, limited to breakdown of skin ([place]) 03/03/2020  . Leukocytosis 03/03/2020  . Anemia associated with chemotherapy 03/03/2020  . Thrombocytopenia ([place]) 03/03/2020  . Port-A-Cath in place 02/19/2020  . Acute renal failure (ARF) ([place]) 01/22/2020  . S/P left mastectomy 12/19/2019  . Genetic testing 11/30/2019  . Morbid obesity with BMI of 60.0-69.9, adult ([place]) 11/21/2019  . Family history of ovarian cancer   . Malignant neoplasm of upper-outer quadrant of left breast in female, estrogen receptor positive ([place]) 11/14/2019  . Fracture of radial head,  right, closed 06/25/2019  . Frequent falls 01/10/2019  . Bilateral bunions 01/10/2019  . Toenail fungus 01/10/2019  . Alcoholic hepatitis 78/46/9629  . Edema 11/20/2018  . Alcohol-induced mood disorder ([place]) 11/01/2013  . Alcohol dependence ([place]) 11/01/2013  . Essential hypertension 06/20/2007  . LOW BACK PAIN 06/20/2007  . DIVERTICULOSIS, COLON 05/05/2007    Past Surgical History:  Procedure Laterality Date  . MASTECTOMY WITH AXILLARY LYMPH NODE DISSECTION Left 12/19/2019   Procedure: LEFT MASTECTOMY WITH AXILLARY LYMPH NODE DISSECTION;  Surgeon: [name], MD;  Location: [facility];  Service: General;  Laterality: Left;  Marland Kitchen MULTIPLE EXTRACTIONS WITH ALVEOLOPLASTY Bilateral 12/14/2019   Procedure: MULTIPLE EXTRACTION WITH ALVEOLOPLASTY;  Surgeon: [name], DDS;  Location: [facility];  Service: Oral Surgery;  Laterality: Bilateral;  . PORTACATH PLACEMENT N/A 12/19/2019   Procedure: INSERTION PORT-A-CATH WITH ULTRASOUND GUIDANCE;  Surgeon: [name], MD;  Location: [facility];  Service: General;  Laterality: N/A;  . RADIAL HEAD ARTHROPLASTY Right 06/25/2019   Procedure: RIGHT RADIAL HEAD ARTHROPLASTY;  Surgeon: [name], MD;  Location: [facility];  Service: Orthopedics;  Laterality: Right;  . TUBAL LIGATION       OB History   No obstetric history on file.     Family History  Problem Relation Age of Onset  . Hypertension Mother   . Dementia Mother   . Ovarian cancer Half-Sister 55  . Diabetes Half-Sister   .  Stroke Half-Sister     Social History   Tobacco Use  . Smoking status: Never Smoker  . Smokeless tobacco: Never Used  Vaping Use  . Vaping Use: Never used  Substance Use Topics  . Alcohol use: Yes    Alcohol/week: 3.0 standard drinks    Types: 1 Cans of beer, 2 Shots of liquor per week    Comment: daily  . Drug use: Yes    Types: Marijuana    Home Medications Prior to Admission medications   Medication Sig Start Date End Date Taking? Authorizing Provider   anastrozole (ARIMIDEX) 1 MG tablet Take 1 tablet (1 mg total) by mouth daily. 03/27/20  Yes Magrinat, Virgie Dad, MD  aspirin EC 81 MG EC tablet Take 1 tablet (81 mg total) by mouth daily. Swallow whole. 02/14/20  Yes [name], MD  carvedilol (COREG) 12.5 MG tablet Take 1 tablet (12.5 mg total) by mouth 2 (two) times daily with a meal. 03/25/20  Yes Newlin, Enobong, MD  dexamethasone (DECADRON) 4 MG tablet Take 4 mg by mouth daily.   Yes [provider]  folic acid (FOLVITE) 1 MG tablet Take 1 tablet (1 mg total) by mouth daily. 03/22/20  Yes Eugenie Filler, MD  hydrALAZINE (APRESOLINE) 25 MG tablet Take 1 tablet (25 mg total) by mouth every 8 (eight) hours. 03/25/20  Yes Charlott Rakes, MD  ibuprofen (ADVIL) 600 MG tablet Take 1 tablet (600 mg total) by mouth every 12 (twelve) hours as needed. Patient taking differently: Take 600 mg by mouth every 12 (twelve) hours as needed for moderate pain.  05/12/20  Yes Charlott Rakes, MD  lidocaine-prilocaine (EMLA) cream Apply 1 application topically as needed. Patient taking differently: Apply 1 application topically as needed (port).  02/05/20  Yes Magrinat, Virgie Dad, MD  Blood Pressure Monitor DEVI Use as instructed by provider to check bloos pressure daily. ICD: I10 02/18/20   Fulp, Cammie, MD  furosemide (LASIX) 20 MG tablet TAKE 1 TABLET BY MOUTH EVERY DAY Patient not taking: Reported on 05/31/2020 05/26/20   Charlott Rakes, MD    Allergies    Amlodipine  Review of Systems   Review of Systems  All other systems reviewed and are negative.   Physical Exam Updated Vital Signs BP 117/62   Pulse 74   Temp 97.7 F (36.5 C) (Oral)   Resp 20   Ht 5\' 4"  (1.626 m)   Wt (!) 140.6 kg   SpO2 100%   BMI 53.21 kg/m   Physical Exam Vitals and nursing note reviewed.  Constitutional:      Appearance: She is well-developed.     Comments: Morbidly obese.  HENT:     Head: Normocephalic and atraumatic.  Eyes:      Conjunctiva/sclera: Conjunctivae normal.  Cardiovascular:     Rate and Rhythm: Normal rate and regular rhythm.  Pulmonary:     Effort: Pulmonary effort is normal.     Breath sounds: Normal breath sounds.  Abdominal:     General: Bowel sounds are normal.     Palpations: Abdomen is soft.  Musculoskeletal:        General: Normal range of motion.     Cervical back: Neck supple.     Comments: Left upper extremity: Edematous.  Bilateral lower extremities: Consistent with chronic venous stasis ulcer and questionable cellulitis  Skin:    General: Skin is warm and dry.  Neurological:     General: No focal deficit present.     Mental  Status: She is alert and oriented to person, place, and time.  Psychiatric:        Behavior: Behavior normal.     ED Results / Procedures / Treatments   Labs (all labs ordered are listed, but only abnormal results are displayed) Labs Reviewed  CBC WITH DIFFERENTIAL/PLATELET - Abnormal; Notable for the following components:      Result Value   WBC 3.8 (*)    RBC 2.33 (*)    Hemoglobin 7.9 (*)    HCT 21.8 (*)    MCHC 36.2 (*)    Platelets 134 (*)    Lymphs Abs 0.6 (*)    All other components within normal limits  COMPREHENSIVE METABOLIC PANEL - Abnormal; Notable for the following components:   Sodium 119 (*)    Chloride 88 (*)    CO2 14 (*)    Glucose, Bld 69 (*)    BUN 51 (*)    Creatinine, Ser 4.49 (*)    Calcium 8.1 (*)    Albumin 2.7 (*)    AST 48 (*)    GFR calc non Af Amer 10 (*)    GFR calc Af Amer 11 (*)    Anion gap 17 (*)    All other components within normal limits  PROTIME-INR    EKG None  Radiology No results found.  Procedures Procedures (including critical care time)  Medications Ordered in ED Medications  0.9 %  sodium chloride infusion (has no administration in time range)    ED Course  I have reviewed the triage vital signs and the nursing notes.  Pertinent labs & imaging results that were available during my  care of the patient were reviewed by me and considered in my medical decision making (see chart for details).    MDM Rules/Calculators/A&P                          History and physical worrisome for DVT of left upper extremity.  Will obtain ultrasound of LUE.  Doppler study of LUE negative. However, sodium is 119. She is anemic, but this is stable. Will admit for further evaluation. Final Clinical Impression(s) / ED Diagnoses Final diagnoses:  Left arm swelling  Malignant neoplasm of left female breast, unspecified estrogen receptor status, unspecified site of breast ([place])  Morbid obesity ([place])  Hyponatremia  Anemia, unspecified type    Rx / DC Orders ED Discharge Orders    None       Nat Christen, MD 05/31/20 1542    Nat Christen, MD 05/31/20 [phone]

## 2020-05-31 NOTE — Progress Notes (Signed)
VASCULAR LAB    Left upper extremity venous duplex has been performed.  See CV proc for preliminary results.  Gave verbal report to Franchot Heidelberg, PA-C and messaged Dr. Lacinda Axon with results.  Meridith Romick, RVT 05/31/2020, 5:02 PM

## 2020-05-31 NOTE — ED Notes (Signed)
Patient given orange juice and crackers

## 2020-05-31 NOTE — ED Notes (Signed)
Pt provided with orange juice and graham crackers, will continue to monitor and recheck cbg

## 2020-05-31 NOTE — ED Notes (Signed)
Date and time results received: 05/31/20 5:27 PM  Test: Sodium Critical Value: 119  Name of Provider Notified: Lacinda Axon, MD   Orders Received? Or Actions Taken?: Orders Received - See Orders for details

## 2020-05-31 NOTE — ED Triage Notes (Signed)
Patient BIBA from home reporting edema bilateral legs and hands   Hx of breast cancer, started chemotherapy 1 month ago and has had issues with bilateral leg and hand swelling. States she was supposed to start antibiotic treatment for cellulitis but did not start it  BP 130/60 P 84 RR 20 99% RA  CBG 80

## 2020-05-31 NOTE — ED Notes (Signed)
Bladder scan shows 333 ml

## 2020-05-31 NOTE — H&P (Signed)
[name] LOV:[id number] DOB: 1956-05-24 DOA: 05/31/2020   PCP: [name], MD   Outpatient Specialists:     Oncology  Dr. Jana Hakim   Patient arrived to ER on 05/31/20 at 1306 Referred by Attending Toy Baker, MD  Patient coming from: home Lives alone,    With family close by   Chief Complaint:  Chief Complaint  Patient presents with  . Leg Swelling  arm swelling  HPI: [name] is a 64 y.o. female with medical history significant of breast CA sp Mastectomy on the left s/p chemo now on radiation, morbid obesity, HTN, multiple skin ulcers, Alcohol liver disease CKD stage IIIa, Grade 1 diastolic CHF   Presented with   Legs are swelling and burning on the bottom She continues to be incontinent She has been urinating a bit more than usual Reports she drinks 4 bottle of water a day and ensure She has no appetite and has not been eating She gained fluid weight  reports her ulcers have been healing She can walk a bit uses wheelchair She still drinks few mixed drinks a day 2-3/day no hx of withdraw Last radiation therapy was 3 days ago  Reports feet are burning Hurts to stand up No diarrhea no melena no blood in stool Last hospitalization to [facility] long from 31 August 2 September for cellulitis of abdominal wall With requiring broad-spectrum antibiotics vancomycin meropenem and fluconazole was able to be discharged on Keflex initially plan was to discharge to SNF but did not work out secondary to financial constraints patient was discharged to home with home health Patient does have home health nurse that comes out Patient has chronic venous insufficiency with venous stasis ulcers which is chronic.  Infectious risk factors:  Reports severe fatigue    Has  been vaccinated against COVID    Initial COVID TEST   in house  PCR testing  Pending  Lab Results  Component Value Date   [place] 04/29/2020   [place] NEGATIVE 03/03/2020    [place] NEGATIVE 02/10/2020   [place] NEGATIVE 01/22/2020    Regarding pertinent Chronic problems:     Breast cancer on Arimidex   HTN on Coreg, Lasix, hydralazine   chronic CHF diastolic  - last echo June 2021      Morbid obesity-   BMI Readings from Last 1 Encounters:  05/31/20 53.21 kg/m     CKD stage IIIa- baseline Cr 2.7 Estimated Creatinine Clearance: 17.8 mL/min (A) (by C-G formula based on SCr of 4.49 mg/dL (H)).  Lab Results  Component Value Date   CREATININE 4.49 (H) 05/31/2020   CREATININE 2.77 (H) 05/15/2020   CREATININE 2.30 (H) 05/14/2020     Liver disease MELD-Na score: 29 at 05/31/2020  3:34 PM MELD score: 22 at 05/31/2020  3:34 PM Calculated from: Serum Creatinine: 4.49 mg/dL (Using max of 4 mg/dL) at 05/31/2020  3:34 PM Serum Sodium: 119 mmol/L (Using min of 125 mmol/L) at 05/31/2020  3:34 PM Total Bilirubin: 0.9 mg/dL (Using min of 1 mg/dL) at 05/31/2020  3:34 PM INR(ratio): 1.2 at 05/31/2020  3:34 PM Age: 17 years   Chronic anemia - baseline hg Hemoglobin & Hematocrit  Recent Labs    05/14/20 1142 05/15/20 1500 05/31/20 1534  HGB 7.9* 8.2* 7.9*    While in ER: Doppler of left lower extremity showing no evidence of DVT Sodium noted to be down to 119  With creatinine up from baseline 2.7 to 4  Hospitalist was called for admission for  hyponatremia and AKI  The following Work up has been ordered so far:  Orders Placed This Encounter  Procedures  . Respiratory Panel by RT PCR (Flu A&B, Covid) - Nasopharyngeal Swab  . DG Chest Port 1 View  . CBC with Differential  . Comprehensive metabolic panel  . Protime-INR  . TSH  . Nursing Communication Glucose is low;  please give pt some calories  . Consult to hospitalist  ALL PATIENTS BEING ADMITTED/HAVING PROCEDURES NEED COVID-19 SCREENING  . Saline lock IV  . UE VENOUS DUPLEX ([facility] & [facility] 7 am - 7 pm)   Following Medications were ordered in ER: Medications  0.9 %  sodium chloride infusion (  Intravenous New Bag/Given 05/31/20 1839)        Consult Orders  (From admission, onward)         Start     Ordered   05/31/20 1833  Consult to hospitalist  ALL PATIENTS BEING ADMITTED/HAVING PROCEDURES NEED COVID-19 SCREENING  Once       Comments: ALL PATIENTS BEING ADMITTED/HAVING PROCEDURES NEED COVID-19 SCREENING  Provider:  (Not yet assigned)  Question Answer Comment  Place call to: [facility]   Reason for Consult Admit      05/31/20 1832          Significant initial  Findings: Abnormal Labs Reviewed  CBC WITH DIFFERENTIAL/PLATELET - Abnormal; Notable for the following components:      Result Value   WBC 3.8 (*)    RBC 2.33 (*)    Hemoglobin 7.9 (*)    HCT 21.8 (*)    MCHC 36.2 (*)    Platelets 134 (*)    Lymphs Abs 0.6 (*)    All other components within normal limits  COMPREHENSIVE METABOLIC PANEL - Abnormal; Notable for the following components:   Sodium 119 (*)    Chloride 88 (*)    CO2 14 (*)    Glucose, Bld 69 (*)    BUN 51 (*)    Creatinine, Ser 4.49 (*)    Calcium 8.1 (*)    Albumin 2.7 (*)    AST 48 (*)    GFR calc non Af Amer 10 (*)    GFR calc Af Amer 11 (*)    Anion gap 17 (*)    All other components within normal limits     Otherwise labs showing:    Recent Labs  Lab 05/31/20 1534  NA 119*  K 3.5  CO2 14*  GLUCOSE 69*  BUN 51*  CREATININE 4.49*  CALCIUM 8.1*    Cr   Up from baseline see below Lab Results  Component Value Date   CREATININE 4.49 (H) 05/31/2020   CREATININE 2.77 (H) 05/15/2020   CREATININE 2.30 (H) 05/14/2020    Recent Labs  Lab 05/31/20 1534  AST 48*  ALT 28  ALKPHOS 94  BILITOT 0.9  PROT 6.9  ALBUMIN 2.7*   Lab Results  Component Value Date   CALCIUM 8.1 (L) 05/31/2020   PHOS 3.0 03/05/2020        WBC      Component Value Date/Time   WBC 3.8 (L) 05/31/2020 1534   LYMPHSABS 0.6 (L) 05/31/2020 1534   LYMPHSABS 1.5 03/25/2020 1103   MONOABS 0.6 05/31/2020 1534   EOSABS 0.1 05/31/2020  1534   EOSABS 0.4 03/25/2020 1103   BASOSABS 0.0 05/31/2020 1534   BASOSABS 0.0 03/25/2020 1103    Plt: Lab Results  Component Value Date   PLT 134 (L)  05/31/2020     Lactic Acid, Venous    Component Value Date/Time   LATICACIDVEN 1.0 05/31/2020 1923       HG/HCT   Down  from baseline see below    Component Value Date/Time   HGB 7.9 (L) 05/31/2020 1534   HGB 10.7 (L) 03/25/2020 1103   HCT 21.8 (L) 05/31/2020 1534   HCT 31.5 (L) 03/25/2020 1103   MCV 93.6 05/31/2020 1534   MCV 91 03/25/2020 1103    No results for input(s): LIPASE, AMYLASE in the last 168 hours. Recent Labs  Lab 05/31/20 1923  AMMONIA 39*     ECG: Ordered    BNP (last 3 results) Recent Labs    04/28/20 2341  BNP 214.4*    DM  labs:  HbA1C: Recent Labs    02/10/20 1536 03/03/20 1018  HGBA1C 5.7* 6.1*       CBG (last 3)  Recent Labs    05/31/20 2042  GLUCAP 68*     UA   no evidence of UTI     Urine analysis:    Component Value Date/Time   COLORURINE YELLOW 05/31/2020 1923   APPEARANCEUR CLEAR 05/31/2020 1923   LABSPEC 1.012 05/31/2020 1923   PHURINE 5.0 05/31/2020 1923   GLUCOSEU NEGATIVE 05/31/2020 1923   HGBUR NEGATIVE 05/31/2020 1923   BILIRUBINUR NEGATIVE 05/31/2020 1923   KETONESUR NEGATIVE 05/31/2020 1923   PROTEINUR 100 (A) 05/31/2020 1923   UROBILINOGEN 1.0 07/14/2012 1630   NITRITE NEGATIVE 05/31/2020 1923   LEUKOCYTESUR NEGATIVE 05/31/2020 1923   Ordered   CXR - cardiomegaly mild vascular congestion    ED Triage Vitals  Enc Vitals Group     BP 05/31/20 1501 123/74     Pulse Rate 05/31/20 1501 80     Resp 05/31/20 1501 16     Temp 05/31/20 1501 (!) 97.5 F (36.4 C)     Temp Source 05/31/20 1501 Oral     SpO2 05/31/20 1501 100 %     Weight 05/31/20 1659 (!) 310 lb (140.6 kg)     Height 05/31/20 1659 5\' 4"  (1.626 m)     Head Circumference --      Peak Flow --      Pain Score --      Pain Loc --      Pain Edu? --      Excl. in [place]? --   TMAX(24)@        Latest  Blood pressure 130/78, pulse 77, temperature 97.7 F (36.5 C), temperature source Oral, resp. rate 20, height 5\' 4"  (1.626 m), weight (!) 140.6 kg, SpO2 100 %.    Review of Systems:    Pertinent positives include:   fatigue,Bilateral lower extremity swelling    Constitutional:  No weight loss, night sweats, Fevers, chills, weight loss  HEENT:  No headaches, Difficulty swallowing,Tooth/dental problems,Sore throat,  No sneezing, itching, ear ache, nasal congestion, post nasal drip,  Cardio-vascular:  No chest pain, Orthopnea, PND, anasarca, dizziness, palpitations.no GI:  No heartburn, indigestion, abdominal pain, nausea, vomiting, diarrhea, change in bowel habits, loss of appetite, melena, blood in stool, hematemesis Resp:  no shortness of breath at rest. No dyspnea on exertion, No excess mucus, no productive cough, No non-productive cough, No coughing up of blood.No change in color of mucus.No wheezing. Skin:  no rash or lesions. No jaundice GU:  no dysuria, change in color of urine, no urgency or frequency. No straining to urinate.  No flank pain.  Musculoskeletal:  No joint pain or no joint swelling. No decreased range of motion. No back pain.  Psych:  No change in mood or affect. No depression or anxiety. No memory loss.  Neuro: no localizing neurological complaints, no tingling, no weakness, no double vision, no gait abnormality, no slurred speech, no confusion  All systems reviewed and apart from [place] all are negative  Past Medical History:   Past Medical History:  Diagnosis Date  . Arthritis   . Cancer ([place]) 11/2019   Left breast  . Diverticulitis   . Family history of ovarian cancer   . Heart murmur    11/26/19 echo: Mild AS. AV mean gradient 12.0 mmHg; however, LVOT gradient 8 mmHg with intracvitary gradient-significant AS is not suspected  . Hypertension       Past Surgical History:  Procedure Laterality Date  . MASTECTOMY WITH AXILLARY LYMPH NODE  DISSECTION Left 12/19/2019   Procedure: LEFT MASTECTOMY WITH AXILLARY LYMPH NODE DISSECTION;  Surgeon: [name], MD;  Location: [place];  Service: General;  Laterality: Left;  Marland Kitchen MULTIPLE EXTRACTIONS WITH ALVEOLOPLASTY Bilateral 12/14/2019   Procedure: MULTIPLE EXTRACTION WITH ALVEOLOPLASTY;  Surgeon: [name], DDS;  Location: [facility];  Service: Oral Surgery;  Laterality: Bilateral;  . PORTACATH PLACEMENT N/A 12/19/2019   Procedure: INSERTION PORT-A-CATH WITH ULTRASOUND GUIDANCE;  Surgeon: [name], MD;  Location: [facility];  Service: General;  Laterality: N/A;  . RADIAL HEAD ARTHROPLASTY Right 06/25/2019   Procedure: RIGHT RADIAL HEAD ARTHROPLASTY;  Surgeon: [name], MD;  Location: [facility];  Service: Orthopedics;  Laterality: Right;  . TUBAL LIGATION      Social History:  Ambulatory  walker  wheelchair bound      reports that she has never smoked. She has never used smokeless tobacco. She reports current alcohol use of about 3.0 standard drinks of alcohol per week. She reports current drug use. Drug: Marijuana.    Family History:   Family History  Problem Relation Age of Onset  . Hypertension Mother   . Dementia Mother   . Ovarian cancer Half-Sister 67  . Diabetes Half-Sister   . Stroke Half-Sister     Allergies: Allergies  Allergen Reactions  . Amlodipine Swelling    BLE edema     Prior to Admission medications   Medication Sig Start Date End Date Taking? Authorizing Provider  anastrozole (ARIMIDEX) 1 MG tablet Take 1 tablet (1 mg total) by mouth daily. 03/27/20  Yes Magrinat, Virgie Dad, MD  aspirin EC 81 MG EC tablet Take 1 tablet (81 mg total) by mouth daily. Swallow whole. 02/14/20  Yes [name], MD  carvedilol (COREG) 12.5 MG tablet Take 1 tablet (12.5 mg total) by mouth 2 (two) times daily with a meal. 03/25/20  Yes Newlin, Enobong, MD  dexamethasone (DECADRON) 4 MG tablet Take 4 mg by mouth daily.   Yes [provider]  folic acid  (FOLVITE) 1 MG tablet Take 1 tablet (1 mg total) by mouth daily. 03/22/20  Yes Eugenie Filler, MD  hydrALAZINE (APRESOLINE) 25 MG tablet Take 1 tablet (25 mg total) by mouth every 8 (eight) hours. 03/25/20  Yes Charlott Rakes, MD  ibuprofen (ADVIL) 600 MG tablet Take 1 tablet (600 mg total) by mouth every 12 (twelve) hours as needed. Patient taking differently: Take 600 mg by mouth every 12 (twelve) hours as needed for moderate pain.  05/12/20  Yes Charlott Rakes, MD  lidocaine-prilocaine (EMLA) cream Apply 1 application topically as needed. Patient taking differently: Apply  1 application topically as needed (port).  02/05/20  Yes Magrinat, Virgie Dad, MD  Blood Pressure Monitor DEVI Use as instructed by provider to check bloos pressure daily. ICD: I10 02/18/20   Fulp, Ander Gaster, MD  furosemide (LASIX) 20 MG tablet TAKE 1 TABLET BY MOUTH EVERY DAY Patient not taking: Reported on 05/31/2020 05/26/20   Charlott Rakes, MD   Physical Exam: Vitals with BMI 05/31/2020 05/31/2020 05/31/2020  Height - - -  Weight - - -  BMI - - -  Systolic [phone]  Diastolic 78 62 57  Pulse 77 74 74  Some encounter information is confidential and restricted. Go to Review Flowsheets activity to see all data.    1. General:  in No Acute distress   Chronically ill -appearing 2. Psychological: Alert and   Oriented 3. Head/ENT:   Moist  Mucous Membranes                          Head Non traumatic, neck supple                           Poor Dentition 4. SKIN: normal Skin turgor,  Skin clean Dry small ulceration under panus healing, dry discolored skin on the left breast 5. Heart: Regular rate and rhythm no  Murmur, no Rub or gallop 6. Lungs:  no wheezes or crackles   7. Abdomen: Soft,  non-tender, Non distended obese bowel sounds present 8. Lower extremities: no clubbing, cyanosis, diffuse edema 9. Neurologically Grossly intact, moving all 4 extremities equally   10. MSK: Normal range of motion   All other LABS:      Recent Labs  Lab 05/31/20 1534  WBC 3.8*  NEUTROABS 2.5  HGB 7.9*  HCT 21.8*  MCV 93.6  PLT 134*     Recent Labs  Lab 05/31/20 1534  NA 119*  K 3.5  CL 88*  CO2 14*  GLUCOSE 69*  BUN 51*  CREATININE 4.49*  CALCIUM 8.1*     Recent Labs  Lab 05/31/20 1534  AST 48*  ALT 28  ALKPHOS 94  BILITOT 0.9  PROT 6.9  ALBUMIN 2.7*       Cultures:    Component Value Date/Time   SDES  04/30/2020 0834    BLOOD RIGHT HAND Performed at [facility], [place] [address], [place], [place] [address]    SPECREQUEST  04/30/2020 [phone]    BOTTLES DRAWN AEROBIC AND ANAEROBIC Blood Culture adequate volume Performed at [place] [address], [place], [place] [address]    CULT  04/30/2020 0814    NO GROWTH 5 DAYS Performed at [facility], [place] [address], [place], [place] [address]    REPTSTATUS 05/05/2020 FINAL 04/30/2020 6314     Radiological Exams on Admission: US RENAL  Result Date: 05/31/2020 CLINICAL DATA:  Acute kidney injury. EXAM: RENAL / URINARY TRACT ULTRASOUND COMPLETE COMPARISON:  CT abdomen pelvis 03/07/2020 FINDINGS: Right Kidney: Renal measurements: 13.3 x 5.3 x 6.2 cm = volume: 226 mL. Echogenicity within normal limits. No mass or hydronephrosis visualized. Left Kidney: Renal measurements: 11.3 x 5.8 x 4.9 cm = volume: 185 mL. Poor visualization secondary to patient body habitus. No definite hydronephrosis, renal calculus or renal mass. Bladder: Not visualized. Other: None. IMPRESSION: Limited evaluation particularly of the left kidney due to patient body habitus. No definite acute finding. Electronically Signed   By:  Audie Pinto M.D.   On: 05/31/2020 19:52   DG Chest Port 1 View  Result Date: 05/31/2020 CLINICAL DATA:  Left arm and hand swelling for several days. EXAM: PORTABLE CHEST 1 VIEW COMPARISON:  04/29/2020 FINDINGS: There is a stable right-sided subclavian Port-A-Cath. The heart size remains enlarged but  stable from prior study. Aortic calcifications are noted. There is probable mild vascular congestion without overt pulmonary edema. There is no pneumothorax. Surgical clips are noted in the patient's left axilla which may related to prior lymph node dissection. IMPRESSION: Cardiomegaly with mild pulmonary vascular congestion. No overt pulmonary edema. Electronically Signed   By: Constance Holster M.D.   On: 05/31/2020 19:27   UE VENOUS DUPLEX ([facility] & [facility] 7 am - 7 pm)  Result Date: 05/31/2020 UPPER VENOUS STUDY  Indications: Edema Limitations: Bandages, line, poor ultrasound/tissue interface, body habitus and edema, patient's somnolence. Comparison Study: No prior study Performing Technologist: Sharion Dove RVS  Examination Guidelines: A complete evaluation includes B-mode imaging, spectral Doppler, color Doppler, and power Doppler as needed of all accessible portions of each vessel. Bilateral testing is considered an integral part of a complete examination. Limited examinations for reoccurring indications may be performed as noted.  Left Findings: +----------+------------+---------+-----------+----------+-------+ LEFT      CompressiblePhasicitySpontaneousPropertiesSummary +----------+------------+---------+-----------+----------+-------+ IJV                      Yes       Yes                      +----------+------------+---------+-----------+----------+-------+ Subclavian               Yes       Yes                      +----------+------------+---------+-----------+----------+-------+ Axillary                 Yes       Yes                      +----------+------------+---------+-----------+----------+-------+ Brachial      Full       Yes       Yes                      +----------+------------+---------+-----------+----------+-------+ Cephalic      Full                                          +----------+------------+---------+-----------+----------+-------+ Basilic                   Yes       Yes                      +----------+------------+---------+-----------+----------+-------+  Summary:  Left: No obvious evidence of deep vein thrombosis in the upper extremity. No obvious evidence of superficial vein thrombosis in the upper extremity.  *See table(s) above for measurements and observations.    Preliminary     Chart has been reviewed  Assessment/Plan  64 y.o. female with medical history significant of breast CA sp Mastectomy on the left s/p chemo now on radiation, morbid obesity, HTN, multiple skin ulcers, Alcohol liver disease CKD stage IIIa, Grade 1 diastolic CHF   Admitted for Hyponatremia   Present on  Admission: . Hyponatremia obtain urine electrolytes somewhat complex situation combination of fluid overload secondary to liver disease/poor nutrition hypoalbuminemia diastolic CHF It is unclear if intravascularly depleted versus hepatorenal syndrome given elevated creatinine Consult nephrology Obtain urine sodium levels urine osmolarity urine creatinine  urine sodium less than 10 Give albumin 25 g IV q8 Followed by NS at 75 and follow Bmet closely Admit to stepdown Appreciate nephrology consult Follow closely sodium BMP Latest Ref Rng & Units 05/31/2020 05/31/2020 05/15/2020  Glucose 70 - 99 mg/dL 67(L) 69(L) 90  BUN 8 - 23 mg/dL 48(H) 51(H) 21  Creatinine 0.44 - 1.00 mg/dL 4.32(H) 4.49(H) 2.77(H)  BUN/Creat Ratio 12 - 28 - - -  Sodium 135 - 145 mmol/L 123(L) 119(LL) 135  Potassium 3.5 - 5.1 mmol/L 3.6 3.5 3.6  Chloride 98 - 111 mmol/L 89(L) 88(L) 99  CO2 22 - 32 mmol/L 15(L) 14(L) 21(L)  Calcium 8.9 - 10.3 mg/dL 8.4(L) 8.1(L) 8.5(L)    . Thrombocytopenia ([place]) -chronic could be possibly related due to chronic liver disease secondary to alcohol abuse Versus history of chemotherapy  . Essential hypertension -continue Coreg with holding parameters Blood hold others for now   . Edema -combination of venous insufficiency diastolic CHF  and low albumin   . Debility -will need PT OT assessment   . Chronic kidney disease, stage 3a ([place]) -acute on chronic avoid nephrotoxic medications Monitor creatinine if need to use Lasix  . Anemia of chronic disease -transfuse if needed continue to follow CBC no history of bleeding prior work-up showed chronic disease anemia.  We will continue to monitor  . Alcohol dependence ([place]) -spoke about portance of quitting patient is interested.  Likely contributing to hyponatremia.  Order CIWA protocol observe in stepdown    . Acute renal failure (ARF) ([facility]) obtain urine electrolytes renal ultrasound to evaluate for any Obstruction. Place, Foley nephrology consult in AM  . Pancytopenia ([place]) -potentially chemotherapy-induced we will continue to monitor email oncology the patient has been admitted  . Breast cancer [facility]) currently receiving radiation therapy The oncology patient has been admitted Continue Arimidex and Decadrone  Skin breakdown -patient is at risk of ulceration continues to be incontinent given AKI Place Foley in order to monitor fluid output And avoid skin breakdown Apply nystatin powder to pannus Wound care consult  Hypomagnesemia will replace and follow-   Hyperphosphatemia -avoid phosphate rich diet appreciate nephrology input Other plan as per orders.  Grade 1 diastolic dysfunction currently appears to be intravascularly depleted. Hold Lasix for now I will administer albumin and gentle IV fluids  Suspect malnutrition will order nutritional consult and check prealbumin  DVT prophylaxis:    Lovenox       Code Status:    Code Status: Prior FULL CODE  as per patient   I had personally discussed CODE STATUS with patient    Family Communication:   Family not at  Bedside    Disposition Plan:    likely will need placement for rehabilitation                         Following barriers for discharge:                            Electrolytes corrected  Pain controlled with PO medications                                                           Will need consultants to evaluate patient prior to discharge                      Would benefit from PT/OT eval prior to DC  Ordered                                      Transition of care consulted                   Nutrition    consulted                  Wound care  consulted                                      Consults called: nephrology is aware, email oncology   Admission status:  ED Disposition    ED Disposition Condition [place]: [facility] [[id number]]  Level of Care: Stepdown [14]  Admit to SDU based on following criteria: Other see comments  Comments: hyponatremia  May admit patient to [facility] or [facility] if equivalent level of care is available:: No  Covid Evaluation: Asymptomatic Screening Protocol (No Symptoms)  Diagnosis: Hyponatremia [643329]  Admitting Physician: Toy Baker [[id number]]  Attending Physician: Toy Baker [[id number]]  Estimated length of stay: past midnight tomorrow  Certification:: I certify this patient will need inpatient services for at least 2 midnights          inpatient     I Expect 2 midnight stay secondary to severity of patient's current illness need for inpatient interventions justified by the following:  Severe lab/radiological/exam abnormalities including:    hyponatremia and extensive comorbidities including:  substance abuse   CHF  Morbid Obesity  CKD  liver disease  malignancy,   That are currently affecting medical management.   I expect  patient to be hospitalized for 2 midnights requiring inpatient medical care.  Patient is at high risk for adverse outcome (such as loss of life or disability) if not treated.  Indication for inpatient stay as follows:  Frequent labs Need for   IV fluids,     Level of care    SDU tele indefinitely  please discontinue once patient no longer qualifies COVID-19 Labs    Lab Results  Component Value Date   [place] NEGATIVE 04/29/2020     Precautions: admitted as  asymptomatic screening protocol  PPE: Used by the provider:   P100  eye Goggles,  Gloves       [name] 05/31/2020, 9:15 PM    [facility]     after 2 AM please page floor coverage PA If 7AM-7PM, please contact the day team taking care of the patient using [email or web address]   Patient was evaluated in the context of the global COVID-19 pandemic, which necessitated consideration that the patient might be at risk for infection with the SARS-CoV-2 virus that causes COVID-19.  Institutional protocols and algorithms that pertain to the evaluation of patients at risk for COVID-19 are in a state of rapid change based on information released by regulatory bodies including the CDC and federal and state organizations. These policies and algorithms were followed during the patient's care.

## 2020-05-31 NOTE — ED Notes (Signed)
Ultrasound tech at pt bedside  

## 2020-06-01 DIAGNOSIS — N179 Acute kidney failure, unspecified: Secondary | ICD-10-CM | POA: Diagnosis not present

## 2020-06-01 LAB — COMPREHENSIVE METABOLIC PANEL
ALT: 24 U/L (ref 0–44)
AST: 40 U/L (ref 15–41)
Albumin: 3 g/dL — ABNORMAL LOW (ref 3.5–5.0)
Alkaline Phosphatase: 87 U/L (ref 38–126)
Anion gap: 15 (ref 5–15)
BUN: 52 mg/dL — ABNORMAL HIGH (ref 8–23)
CO2: 16 mmol/L — ABNORMAL LOW (ref 22–32)
Calcium: 8.1 mg/dL — ABNORMAL LOW (ref 8.9–10.3)
Chloride: 89 mmol/L — ABNORMAL LOW (ref 98–111)
Creatinine, Ser: 4.29 mg/dL — ABNORMAL HIGH (ref 0.44–1.00)
GFR calc Af Amer: 12 mL/min — ABNORMAL LOW (ref >60)
GFR calc non Af Amer: 10 mL/min — ABNORMAL LOW (ref >60)
Glucose, Bld: 80 mg/dL (ref 70–99)
Potassium: 3.3 mmol/L — ABNORMAL LOW (ref 3.5–5.1)
Sodium: 120 mmol/L — ABNORMAL LOW (ref 135–145)
Total Bilirubin: 1.3 mg/dL — ABNORMAL HIGH (ref 0.3–1.2)
Total Protein: 6.6 g/dL (ref 6.5–8.1)

## 2020-06-01 LAB — PREALBUMIN: Prealbumin: 15.5 mg/dL — ABNORMAL LOW (ref 18–38)

## 2020-06-01 LAB — CBC WITH DIFFERENTIAL/PLATELET
Abs Immature Granulocytes: 0.01 10*3/uL (ref 0.00–0.07)
Basophils Absolute: 0 10*3/uL (ref 0.0–0.1)
Basophils Relative: 0 %
Eosinophils Absolute: 0.1 10*3/uL (ref 0.0–0.5)
Eosinophils Relative: 3 %
HCT: 20.7 % — ABNORMAL LOW (ref 36.0–46.0)
Hemoglobin: 7.5 g/dL — ABNORMAL LOW (ref 12.0–15.0)
Immature Granulocytes: 0 %
Lymphocytes Relative: 17 %
Lymphs Abs: 0.5 10*3/uL — ABNORMAL LOW (ref 0.7–4.0)
MCH: 34.4 pg — ABNORMAL HIGH (ref 26.0–34.0)
MCHC: 36.2 g/dL — ABNORMAL HIGH (ref 30.0–36.0)
MCV: 95 fL (ref 80.0–100.0)
Monocytes Absolute: 0.5 10*3/uL (ref 0.1–1.0)
Monocytes Relative: 17 %
Neutro Abs: 1.8 10*3/uL (ref 1.7–7.7)
Neutrophils Relative %: 63 %
Platelets: 136 10*3/uL — ABNORMAL LOW (ref 150–400)
RBC: 2.18 MIL/uL — ABNORMAL LOW (ref 3.87–5.11)
RDW: 14.2 % (ref 11.5–15.5)
WBC: 2.9 10*3/uL — ABNORMAL LOW (ref 4.0–10.5)
nRBC: 0 % (ref 0.0–0.2)

## 2020-06-01 LAB — BASIC METABOLIC PANEL
Anion gap: 17 — ABNORMAL HIGH (ref 5–15)
Anion gap: 15 (ref 5–15)
Anion gap: 16 — ABNORMAL HIGH (ref 5–15)
BUN: 48 mg/dL — ABNORMAL HIGH (ref 8–23)
BUN: 53 mg/dL — ABNORMAL HIGH (ref 8–23)
BUN: 53 mg/dL — ABNORMAL HIGH (ref 8–23)
CO2: 17 mmol/L — ABNORMAL LOW (ref 22–32)
CO2: 16 mmol/L — ABNORMAL LOW (ref 22–32)
CO2: 17 mmol/L — ABNORMAL LOW (ref 22–32)
Calcium: 8.5 mg/dL — ABNORMAL LOW (ref 8.9–10.3)
Calcium: 8.1 mg/dL — ABNORMAL LOW (ref 8.9–10.3)
Calcium: 8.4 mg/dL — ABNORMAL LOW (ref 8.9–10.3)
Chloride: 89 mmol/L — ABNORMAL LOW (ref 98–111)
Chloride: 88 mmol/L — ABNORMAL LOW (ref 98–111)
Chloride: 88 mmol/L — ABNORMAL LOW (ref 98–111)
Creatinine, Ser: 4.13 mg/dL — ABNORMAL HIGH (ref 0.44–1.00)
Creatinine, Ser: 4.15 mg/dL — ABNORMAL HIGH (ref 0.44–1.00)
Creatinine, Ser: 4.19 mg/dL — ABNORMAL HIGH (ref 0.44–1.00)
GFR calc Af Amer: 12 mL/min — ABNORMAL LOW (ref >60)
GFR calc Af Amer: 12 mL/min — ABNORMAL LOW (ref >60)
GFR calc Af Amer: 12 mL/min — ABNORMAL LOW (ref >60)
GFR calc non Af Amer: 11 mL/min — ABNORMAL LOW (ref >60)
GFR calc non Af Amer: 11 mL/min — ABNORMAL LOW (ref >60)
GFR calc non Af Amer: 11 mL/min — ABNORMAL LOW (ref >60)
Glucose, Bld: 77 mg/dL (ref 70–99)
Glucose, Bld: 81 mg/dL (ref 70–99)
Glucose, Bld: 94 mg/dL (ref 70–99)
Potassium: 3.5 mmol/L (ref 3.5–5.1)
Potassium: 3.2 mmol/L — ABNORMAL LOW (ref 3.5–5.1)
Potassium: 3.5 mmol/L (ref 3.5–5.1)
Sodium: 123 mmol/L — ABNORMAL LOW (ref 135–145)
Sodium: 119 mmol/L — CL (ref 135–145)
Sodium: 121 mmol/L — ABNORMAL LOW (ref 135–145)

## 2020-06-01 LAB — RETICULOCYTES
Immature Retic Fract: 7.5 % (ref 2.3–15.9)
RBC.: 2.2 MIL/uL — ABNORMAL LOW (ref 3.87–5.11)
Retic Count, Absolute: 22.9 10*3/uL (ref 19.0–186.0)
Retic Ct Pct: 1 % (ref 0.4–3.1)

## 2020-06-01 LAB — OSMOLALITY: Osmolality: 274 mOsm/kg — ABNORMAL LOW (ref 275–295)

## 2020-06-01 LAB — CBG MONITORING, ED: Glucose-Capillary: 78 mg/dL (ref 70–99)

## 2020-06-01 LAB — FERRITIN: Ferritin: 951 ng/mL — ABNORMAL HIGH (ref 11–307)

## 2020-06-01 LAB — OSMOLALITY, URINE: Osmolality, Ur: 198 mOsm/kg — ABNORMAL LOW (ref 300–900)

## 2020-06-01 LAB — VITAMIN B12: Vitamin B-12: 561 pg/mL (ref 180–914)

## 2020-06-01 LAB — PHOSPHORUS: Phosphorus: 5.8 mg/dL — ABNORMAL HIGH (ref 2.5–4.6)

## 2020-06-01 LAB — MAGNESIUM: Magnesium: 1.9 mg/dL (ref 1.7–2.4)

## 2020-06-01 LAB — FOLATE: Folate: 9.5 ng/mL (ref >5.9)

## 2020-06-01 LAB — TSH: TSH: 4.685 u[IU]/mL — ABNORMAL HIGH (ref 0.350–4.500)

## 2020-06-01 LAB — IRON AND TIBC
Iron: 130 ug/dL (ref 28–170)
Saturation Ratios: 97 % — ABNORMAL HIGH (ref 10.4–31.8)
TIBC: 133 ug/dL — ABNORMAL LOW (ref 250–450)
UIBC: 3 ug/dL

## 2020-06-01 LAB — T4, FREE: Free T4: 1.13 ng/dL — ABNORMAL HIGH (ref 0.61–1.12)

## 2020-06-01 LAB — CORTISOL: Cortisol, Plasma: 11.7 ug/dL

## 2020-06-01 MED ORDER — POTASSIUM CHLORIDE CRYS ER 20 MEQ PO TBCR
20.0000 meq | EXTENDED_RELEASE_TABLET | Freq: Two times a day (BID) | ORAL | Status: AC
  Administered 2020-06-01 – 2020-06-02 (×3): 20 meq via ORAL
  Filled 2020-06-01 (×3): qty 1

## 2020-06-01 MED ORDER — SODIUM CHLORIDE 0.9 % IV SOLN: INTRAVENOUS | Status: DC

## 2020-06-01 MED ORDER — FUROSEMIDE 10 MG/ML IJ SOLN
80.0000 mg | Freq: Once | INTRAMUSCULAR | Status: AC
  Administered 2020-06-01: 80 mg via INTRAVENOUS
  Filled 2020-06-01: qty 8

## 2020-06-01 MED ORDER — SODIUM CHLORIDE 0.9% FLUSH
3.0000 mL | Freq: Two times a day (BID) | INTRAVENOUS | Status: DC
  Administered 2020-06-01 – 2020-06-12 (×12): 3 mL via INTRAVENOUS

## 2020-06-01 MED ORDER — DOCUSATE SODIUM 100 MG PO CAPS
100.0000 mg | ORAL_CAPSULE | Freq: Two times a day (BID) | ORAL | Status: DC
  Administered 2020-06-01 – 2020-06-12 (×20): 100 mg via ORAL
  Filled 2020-06-01 (×22): qty 1

## 2020-06-01 MED ORDER — FOLIC ACID 1 MG PO TABS
1.0000 mg | ORAL_TABLET | Freq: Every day | ORAL | Status: DC
  Administered 2020-06-01 – 2020-06-12 (×12): 1 mg via ORAL
  Filled 2020-06-01 (×12): qty 1

## 2020-06-01 MED ORDER — CARVEDILOL 12.5 MG PO TABS
12.5000 mg | ORAL_TABLET | Freq: Two times a day (BID) | ORAL | Status: DC
  Administered 2020-06-01 – 2020-06-04 (×7): 12.5 mg via ORAL
  Filled 2020-06-01 (×7): qty 1

## 2020-06-01 MED ORDER — ACETAMINOPHEN 650 MG RE SUPP: 650.0000 mg | Freq: Four times a day (QID) | RECTAL | Status: DC | PRN

## 2020-06-01 MED ORDER — ONDANSETRON HCL 4 MG/2ML IJ SOLN: 4.0000 mg | Freq: Four times a day (QID) | INTRAMUSCULAR | Status: DC | PRN

## 2020-06-01 MED ORDER — HEPARIN SODIUM (PORCINE) 5000 UNIT/ML IJ SOLN
5000.0000 [IU] | Freq: Three times a day (TID) | INTRAMUSCULAR | Status: DC
  Administered 2020-06-01 – 2020-06-12 (×33): 5000 [IU] via SUBCUTANEOUS
  Filled 2020-06-01 (×31): qty 1

## 2020-06-01 MED ORDER — ONDANSETRON HCL 4 MG PO TABS: 4.0000 mg | ORAL_TABLET | Freq: Four times a day (QID) | ORAL | Status: DC | PRN

## 2020-06-01 MED ORDER — AQUAPHOR EX OINT
TOPICAL_OINTMENT | Freq: Two times a day (BID) | CUTANEOUS | Status: AC
  Administered 2020-06-05: 1 via TOPICAL
  Filled 2020-06-01 (×4): qty 50

## 2020-06-01 MED ORDER — DEXAMETHASONE 4 MG PO TABS
4.0000 mg | ORAL_TABLET | Freq: Every day | ORAL | Status: DC
  Administered 2020-06-01 – 2020-06-12 (×12): 4 mg via ORAL
  Filled 2020-06-01 (×12): qty 1

## 2020-06-01 MED ORDER — HYDROCODONE-ACETAMINOPHEN 5-325 MG PO TABS
1.0000 | ORAL_TABLET | ORAL | Status: DC | PRN
  Administered 2020-06-02 – 2020-06-08 (×8): 1 via ORAL
  Filled 2020-06-01: qty 1
  Filled 2020-06-01: qty 2
  Filled 2020-06-01 (×6): qty 1

## 2020-06-01 MED ORDER — POLYETHYLENE GLYCOL 3350 17 G PO PACK: 17.0000 g | PACK | Freq: Every day | ORAL | Status: DC | PRN

## 2020-06-01 MED ORDER — ANASTROZOLE 1 MG PO TABS
1.0000 mg | ORAL_TABLET | Freq: Every day | ORAL | Status: DC
  Administered 2020-06-01 – 2020-06-12 (×12): 1 mg via ORAL
  Filled 2020-06-01 (×12): qty 1

## 2020-06-01 MED ORDER — ALBUMIN HUMAN 25 % IV SOLN
12.5000 g | Freq: Once | INTRAVENOUS | Status: AC
  Administered 2020-06-01: 12.5 g via INTRAVENOUS
  Filled 2020-06-01: qty 50

## 2020-06-01 MED ORDER — ACETAMINOPHEN 325 MG PO TABS: 650.0000 mg | ORAL_TABLET | Freq: Four times a day (QID) | ORAL | Status: DC | PRN

## 2020-06-01 MED ORDER — FUROSEMIDE 10 MG/ML IJ SOLN
80.0000 mg | Freq: Once | INTRAMUSCULAR | Status: AC
  Administered 2020-06-02: 80 mg via INTRAVENOUS
  Filled 2020-06-01: qty 8

## 2020-06-01 NOTE — ED Notes (Signed)
Date and time results received: 06/01/20 9:16 AM  (use smartphrase ".now" to insert current time)  Test: Na+  Critical Value: 119  Name of Provider Notified: Kandice Moos, RN  Orders Received? Or Actions Taken?: Actions Taken: Engineer, structural

## 2020-06-01 NOTE — ED Notes (Addendum)
Pt refused meal tray. Pt asked for the lights to be turned out, she "wants to rest."

## 2020-06-01 NOTE — ED Notes (Signed)
Pt. Documented in error see above note in chart. 

## 2020-06-01 NOTE — Progress Notes (Signed)
PROGRESS NOTE  [name] NOB:[id number] DOB: [birth year] DOA: 05/31/2020 PCP: [name], MD  HPI/Recap of past 24 hours: [name] is a 64 y.o. female with medical history significant of breast CA sp Mastectomy and radiation on the left, morbid obesity, HTN, multiple skin ulcers, Alcohol liver disease CKD stage IIIa, Grade 1 diastolic CHF.  Presented with bilateral leg swelling and burning on the bottom.  She has been urinating a bit more than usual.  Reports she drinks 4 bottle of water a day and ensure.  She has no appetite and has not been eating.  She gained fluid weight.  Reports her ulcers have been healing.  She can walk a bit uses wheelchair.  She still drinks few mixed drinks a day 2-3/day no hx of withdraw.  Last left breast radiation therapy was 3 days ago  Reports feet are burning.  Hurts to stand up.  No diarrhea no melena no blood in stool.  Work up revealed No evidence of DVT, Hyponatremia and acute on chronic CKD3B.  TRH was asked to admit.  Nephrology was consulted and is following.  06/01/20: Seen and examined in her room in the ED.  Reports it has been difficult for her to walk.  She lives at home with her granddaughters, ages 47 and 9.  States they have been helping her.   Assessment/Plan: Active Problems:   Essential hypertension   Alcohol dependence ([place])   Edema   Malignant neoplasm of upper-outer quadrant of left breast in female, estrogen receptor positive ([place])   Morbid obesity with BMI of 60.0-69.9, adult (HCC)   Acute renal failure (ARF) (HCC)   Thrombocytopenia (HCC)   Debility   Anemia of chronic disease   Chronic kidney disease, stage 3a (HCC)   Hyponatremia   Pancytopenia (HCC)   Breast cancer (HCC)  Hypervolemic hyponatremia Reports drinking 4 bottles of water and Ensure daily Presented with serum sodium 119, low serum osmolality, hypovolemic on exam Hold off IV fluids Nephrology has been consulted, appreciate assistance. Trend serial  serum sodium  Nonoliguric AKI on CKD 3B Baseline creatinine appears to be 2.7 Presented with creatinine of greater than 4 with GFR of 12 Avoid nephrotoxins and hypotension Monitor urine output  Management per nephrology Daily renal panel  Pancytopenia, likely secondary to chemotherapy Hemoglobin 7.5, transfuse hemoglobin less than 7.0 or symptomatic WBC downtrending 2.9K Plt 136K.  Hypokalemia Potassium 3.2 Replete orally with 40 mEq x1 Judicious replacement of potassium due to significant renal insufficiency  Isolated hyperbilirubinemia Monitor and trend level  Chronic diastolic CHF Last 2D echo done on 02/11/2020 showed LVEF 70 to 25% and grade 1 diastolic dysfunction Strict I's and O's, daily weights.  Physical debility PT to assess Fall precautions  History of breast cancer/radiation Last radiation 3 days ago affecting left breast, Nodes noted on exam, POA Wound care specialist for local wound care recommendations  Code Status: Full code  Family Communication: None at bedside  Disposition Plan: We will discharge to home with home health services versus SNF.   Consultants:  Nephrology.  Procedures:  None  Antimicrobials:  None   DVT prophylaxis: Subcu heparin 3 times daily  Status is: Inpatient  Home.  Dispo: The patient is from: Home.               Anticipated d/c is to: Home with home health services PT OT RN versus SNF.               Anticipated d/c date  is: 03/03/2020.              Patient currently not stable for discharge to home: Management for AKI on CKD 3B.         Objective: Vitals:   06/01/20 0600 06/01/20 0751 06/01/20 0956 06/01/20 1213  BP: 114/64 133/83 125/75 126/74  Pulse: 78 86 67 70  Resp: 17 19 16 16   Temp:      TempSrc:      SpO2: 100% 97% 98% 100%  Weight:      Height:        Intake/Output Summary (Last 24 hours) at 06/01/2020 1228 Last data filed at 06/01/2020 7494 Gross per 24 hour  Intake 544.21 ml   Output 550 ml  Net -5.79 ml   Filed Weights   05/31/20 1659  Weight: (!) 140.6 kg    Exam:  . General: 64 y.o. year-old female well developed well nourished in no acute distress.  Alert and oriented x3. . Cardiovascular: Regular rate and rhythm with no rubs or gallops.  No thyromegaly or JVD noted.   Marland Kitchen Respiratory: Clear to auscultation with no wheezes or rales. Good inspiratory effort. . Abdomen: Soft nontender nondistended with normal bowel sounds x4 quadrants. . Musculoskeletal: Bilateral lower extremity edema up to her thighs. . Skin: Left chest burn noted from radiation . Psychiatry: Mood is appropriate for condition and setting   Data Reviewed: CBC: Recent Labs  Lab 05/31/20 1534 06/01/20 0423  WBC 3.8* 2.9*  NEUTROABS 2.5 1.8  HGB 7.9* 7.5*  HCT 21.8* 20.7*  MCV 93.6 95.0  PLT 134* 496*   Basic Metabolic Panel: Recent Labs  Lab 05/31/20 1534 05/31/20 1923 06/01/20 0153 06/01/20 0423 06/01/20 0700  NA 119* 123* 123* 120* 119*  K 3.5 3.6 3.5 3.3* 3.2*  CL 88* 89* 89* 89* 88*  CO2 14* 15* 17* 16* 16*  GLUCOSE 69* 67* 77 80 81  BUN 51* 48* 48* 52* 53*  CREATININE 4.49* 4.32* 4.13* 4.29* 4.15*  CALCIUM 8.1* 8.4* 8.5* 8.1* 8.1*  MG  --  1.6*  --  1.9  --   PHOS  --  6.2*  --  5.8*  --    GFR: Estimated Creatinine Clearance: 19.3 mL/min (A) (by C-G formula based on SCr of 4.15 mg/dL (H)). Liver Function Tests: Recent Labs  Lab 05/31/20 1534 06/01/20 0423  AST 48* 40  ALT 28 24  ALKPHOS 94 87  BILITOT 0.9 1.3*  PROT 6.9 6.6  ALBUMIN 2.7* 3.0*   No results for input(s): LIPASE, AMYLASE in the last 168 hours. Recent Labs  Lab 05/31/20 1923  AMMONIA 39*   Coagulation Profile: Recent Labs  Lab 05/31/20 1534  INR 1.2   Cardiac Enzymes: No results for input(s): CKTOTAL, CKMB, CKMBINDEX, TROPONINI in the last 168 hours. BNP (last 3 results) No results for input(s): PROBNP in the last 8760 hours. HbA1C: No results for input(s): HGBA1C in the  last 72 hours. CBG: Recent Labs  Lab 05/31/20 2042 05/31/20 2245 06/01/20 0150  GLUCAP 68* 77 78   Lipid Profile: No results for input(s): CHOL, HDL, LDLCALC, TRIG, CHOLHDL, LDLDIRECT in the last 72 hours. Thyroid Function Tests: Recent Labs    06/01/20 0423  TSH 4.685*   Anemia Panel: Recent Labs    06/01/20 0423  VITAMINB12 561  FOLATE 9.5  FERRITIN 951*  TIBC 133*  IRON 130  RETICCTPCT 1.0   Urine analysis:    Component Value Date/Time   COLORURINE YELLOW 05/31/2020  [place] 05/31/2020 1923   LABSPEC 1.012 05/31/2020 1923   PHURINE 5.0 05/31/2020 1923   GLUCOSEU NEGATIVE 05/31/2020 1923   HGBUR NEGATIVE 05/31/2020 1923   BILIRUBINUR NEGATIVE 05/31/2020 1923   KETONESUR NEGATIVE 05/31/2020 1923   PROTEINUR 100 (A) 05/31/2020 1923   UROBILINOGEN 1.0 07/14/2012 1630   NITRITE NEGATIVE 05/31/2020 1923   LEUKOCYTESUR NEGATIVE 05/31/2020 1923   Sepsis Labs: @LABRCNTIP (procalcitonin:4,lacticidven:4)  ) Recent Results (from the past 240 hour(s))  Respiratory Panel by RT PCR (Flu A&B, Covid) - Nasopharyngeal Swab     Status: None   Collection Time: 05/31/20  7:23 PM   Specimen: Nasopharyngeal Swab  Result Value Ref Range Status   SARS Coronavirus 2 by RT PCR NEGATIVE NEGATIVE Final    Comment: (NOTE) SARS-CoV-2 target nucleic acids are NOT DETECTED.  The SARS-CoV-2 RNA is generally detectable in upper respiratoy specimens during the acute phase of infection. The lowest concentration of SARS-CoV-2 viral copies this assay can detect is 131 copies/mL. A negative result does not preclude SARS-Cov-2 infection and should not be used as the sole basis for treatment or other patient management decisions. A negative result may occur with  improper specimen collection/handling, submission of specimen other than nasopharyngeal swab, presence of viral mutation(s) within the areas targeted by this assay, and inadequate number of viral copies (<131  copies/mL). A negative result must be combined with clinical observations, patient history, and epidemiological information. The expected result is Negative.  Fact Sheet for Patients:  PinkCheek.be  Fact Sheet for Healthcare Providers:  GravelBags.it  This test is no t yet approved or cleared by the [place] FDA and  has been authorized for detection and/or diagnosis of SARS-CoV-2 by FDA under an Emergency Use Authorization (EUA). This EUA will remain  in effect (meaning this test can be used) for the duration of the COVID-19 declaration under Section 564(b)(1) of the Act, 21 U.S.C. section 360bbb-3(b)(1), unless the authorization is terminated or revoked sooner.     Influenza A by PCR NEGATIVE NEGATIVE Final   Influenza B by PCR NEGATIVE NEGATIVE Final    Comment: (NOTE) The Xpert Xpress SARS-CoV-2/FLU/RSV assay is intended as an aid in  the diagnosis of influenza from Nasopharyngeal swab specimens and  should not be used as a sole basis for treatment. Nasal washings and  aspirates are unacceptable for Xpert Xpress SARS-CoV-2/FLU/RSV  testing.  Fact Sheet for Patients: PinkCheek.be  Fact Sheet for Healthcare Providers: GravelBags.it  This test is not yet approved or cleared by the [place] FDA and  has been authorized for detection and/or diagnosis of SARS-CoV-2 by  FDA under an Emergency Use Authorization (EUA). This EUA will remain  in effect (meaning this test can be used) for the duration of the  Covid-19 declaration under Section 564(b)(1) of the Act, 21  U.S.C. section 360bbb-3(b)(1), unless the authorization is  terminated or revoked. Performed at [facility], [place] [address] [place], [place] [address]       Studies: US RENAL  Result Date: 05/31/2020 CLINICAL DATA:  Acute kidney injury. EXAM: RENAL / URINARY TRACT  ULTRASOUND COMPLETE COMPARISON:  CT abdomen pelvis 03/07/2020 FINDINGS: Right Kidney: Renal measurements: 13.3 x 5.3 x 6.2 cm = volume: 226 mL. Echogenicity within normal limits. No mass or hydronephrosis visualized. Left Kidney: Renal measurements: 11.3 x 5.8 x 4.9 cm = volume: 185 mL. Poor visualization secondary to patient body habitus. No definite hydronephrosis, renal calculus or renal mass. Bladder: Not visualized. Other:  None. IMPRESSION: Limited evaluation particularly of the left kidney due to patient body habitus. No definite acute finding. Electronically Signed   By: Audie Pinto M.D.   On: 05/31/2020 19:52   DG Chest Port 1 View  Result Date: 05/31/2020 CLINICAL DATA:  Left arm and hand swelling for several days. EXAM: PORTABLE CHEST 1 VIEW COMPARISON:  04/29/2020 FINDINGS: There is a stable right-sided subclavian Port-A-Cath. The heart size remains enlarged but stable from prior study. Aortic calcifications are noted. There is probable mild vascular congestion without overt pulmonary edema. There is no pneumothorax. Surgical clips are noted in the patient's left axilla which may related to prior lymph node dissection. IMPRESSION: Cardiomegaly with mild pulmonary vascular congestion. No overt pulmonary edema. Electronically Signed   By: Constance Holster M.D.   On: 05/31/2020 19:27   UE VENOUS DUPLEX ([facility] & [facility] 7 am - 7 pm)  Result Date: 05/31/2020 UPPER VENOUS STUDY  Indications: Edema Limitations: Bandages, line, poor ultrasound/tissue interface, body habitus and edema, patient's somnolence. Comparison Study: No prior study Performing Technologist: Sharion Dove RVS  Examination Guidelines: A complete evaluation includes B-mode imaging, spectral Doppler, color Doppler, and power Doppler as needed of all accessible portions of each vessel. Bilateral testing is considered an integral part of a complete examination. Limited examinations for reoccurring indications may be performed as noted.   Left Findings: +----------+------------+---------+-----------+----------+-------+ LEFT      CompressiblePhasicitySpontaneousPropertiesSummary +----------+------------+---------+-----------+----------+-------+ IJV                      Yes       Yes                      +----------+------------+---------+-----------+----------+-------+ Subclavian               Yes       Yes                      +----------+------------+---------+-----------+----------+-------+ Axillary                 Yes       Yes                      +----------+------------+---------+-----------+----------+-------+ Brachial      Full       Yes       Yes                      +----------+------------+---------+-----------+----------+-------+ Cephalic      Full                                          +----------+------------+---------+-----------+----------+-------+ Basilic                  Yes       Yes                      +----------+------------+---------+-----------+----------+-------+  Summary:  Left: No obvious evidence of deep vein thrombosis in the upper extremity. No obvious evidence of superficial vein thrombosis in the upper extremity.  *See table(s) above for measurements and observations.    Preliminary     Scheduled Meds: . anastrozole  1 mg Oral Daily  . carvedilol  12.5 mg Oral BID WC  . dexamethasone  4 mg Oral Daily  . docusate sodium  100 mg Oral BID  . folic acid  1 mg Oral Daily  . folic acid  1 mg Oral Daily  . furosemide  80 mg Intravenous Once  . furosemide  80 mg Intravenous Once  . heparin  5,000 Units Subcutaneous Q8H  . multivitamin with minerals  1 tablet Oral Daily  . nystatin   Topical TID  . potassium chloride  20 mEq Oral BID  . sodium chloride flush  3 mL Intravenous Q12H  . sodium chloride flush  3 mL Intravenous Q12H  . thiamine  100 mg Oral Daily   Or  . thiamine  100 mg Intravenous Daily    Continuous Infusions: . sodium chloride    .  albumin human    . albumin human       LOS: 1 day     Kayleen Memos, MD [facility] Pager [phone]  If 7PM-7AM, please contact night-coverage [email or web address] Password [facility] 06/01/2020, 12:28 PM

## 2020-06-01 NOTE — Consult Note (Signed)
Reason for Consult: Acute kidney injury on chronic kidney disease stage III, hyponatremia, volume overload Referring Physician: Irene Pap, DO  HPI:  64 year old African-American woman with past medical history significant for hypertension, ER+ left breast cancer status post mastectomy in April, 2021 followed by radiation therapy and currently on anastrozole.  Her labs from the past several months show variable renal function with suspected baseline renal function with a creatinine ranging 1.0-1.3.  Last month, creatinine was noted to range 2.3-2.8 which appears to coincide with increasing lower extremity swelling/edema with diuretic therapy using furosemide 20 mg daily (the patient was also taking ibuprofen 600 mg twice daily).  She came into the hospital yesterday with complaints of increasing lower extremity swelling and some soreness over her lower back.  She has chronic dyspnea and did not notice this to have been worse.  She denies any nausea, vomiting or diarrhea but reports poor appetite and has been not eating food but has been drinking at least 64 ounces of water, Ensure and mixed alcoholic beverages.  She denies any dysuria or pelvic pain and has chronic urinary incontinence.  She denies any orthostatic dizziness.  Unfortunately, she is a poor historian and informs me that she has never had problems with her kidneys however, she had acute kidney injury with hyponatremia as recently as 4 months ago during a hospitalization.  In the emergency room labs were significant for a creatinine of 4.5 with a BUN of 51, bicarbonate of 14 and sodium of 119.  Sodium level transiently improved to 123 with isotonic fluids but is down to 119 again this morning along with hypokalemia of 3.2 and hypophosphatemia 5.8.  Serum osmolality was 270.  She is anemic with an iron saturation of 97% and ferritin of 951.  TSH normal range.  Urine osmolality pending with urinalysis showing a specific gravity of 1012, pH of 5.0  and 100 protein.  Urine sodium <10 and urine creatinine 176.  Renal ultrasound did not show any obstruction.  Past Medical History:  Diagnosis Date  . Arthritis   . Cancer ([facility]) 11/2019   Left breast  . Diverticulitis   . Family history of ovarian cancer   . Heart murmur    11/26/19 echo: Mild AS. AV mean gradient 12.0 mmHg; however, LVOT gradient 8 mmHg with intracvitary gradient-significant AS is not suspected  . Hypertension     Past Surgical History:  Procedure Laterality Date  . MASTECTOMY WITH AXILLARY LYMPH NODE DISSECTION Left 12/19/2019   Procedure: LEFT MASTECTOMY WITH AXILLARY LYMPH NODE DISSECTION;  Surgeon: [name], MD;  Location: [facility];  Service: General;  Laterality: Left;  Marland Kitchen MULTIPLE EXTRACTIONS WITH ALVEOLOPLASTY Bilateral 12/14/2019   Procedure: MULTIPLE EXTRACTION WITH ALVEOLOPLASTY;  Surgeon: [name], DDS;  Location: [facility];  Service: Oral Surgery;  Laterality: Bilateral;  . PORTACATH PLACEMENT N/A 12/19/2019   Procedure: INSERTION PORT-A-CATH WITH ULTRASOUND GUIDANCE;  Surgeon: [name], MD;  Location: [facility];  Service: General;  Laterality: N/A;  . RADIAL HEAD ARTHROPLASTY Right 06/25/2019   Procedure: RIGHT RADIAL HEAD ARTHROPLASTY;  Surgeon: [name], MD;  Location: [facility];  Service: Orthopedics;  Laterality: Right;  . TUBAL LIGATION      Family History  Problem Relation Age of Onset  . Hypertension Mother   . Dementia Mother   . Ovarian cancer Half-Sister 55  . Diabetes Half-Sister   . Stroke Half-Sister     Social History:  reports that she has never smoked. She has never used smokeless  tobacco. She reports current alcohol use of about 3.0 standard drinks of alcohol per week. She reports current drug use. Drug: Marijuana.  Allergies:  Allergies  Allergen Reactions  . Amlodipine Swelling    BLE edema    Medications:  Scheduled: . anastrozole  1 mg Oral Daily  . carvedilol  12.5 mg Oral BID WC  . dexamethasone  4 mg Oral  Daily  . docusate sodium  100 mg Oral BID  . folic acid  1 mg Oral Daily  . folic acid  1 mg Oral Daily  . furosemide  80 mg Intravenous Once  . furosemide  80 mg Intravenous Once  . heparin  5,000 Units Subcutaneous Q8H  . multivitamin with minerals  1 tablet Oral Daily  . nystatin   Topical TID  . potassium chloride  20 mEq Oral BID  . sodium chloride flush  3 mL Intravenous Q12H  . sodium chloride flush  3 mL Intravenous Q12H  . thiamine  100 mg Oral Daily   Or  . thiamine  100 mg Intravenous Daily    BMP Latest Ref Rng & Units 06/01/2020 06/01/2020 06/01/2020  Glucose 70 - 99 mg/dL 81 80 77  BUN 8 - 23 mg/dL 53(H) 52(H) 48(H)  Creatinine 0.44 - 1.00 mg/dL 4.15(H) 4.29(H) 4.13(H)  BUN/Creat Ratio 12 - 28 - - -  Sodium 135 - 145 mmol/L 119(LL) 120(L) 123(L)  Potassium 3.5 - 5.1 mmol/L 3.2(L) 3.3(L) 3.5  Chloride 98 - 111 mmol/L 88(L) 89(L) 89(L)  CO2 22 - 32 mmol/L 16(L) 16(L) 17(L)  Calcium 8.9 - 10.3 mg/dL 8.1(L) 8.1(L) 8.5(L)   CBC Latest Ref Rng & Units 06/01/2020 05/31/2020 05/15/2020  WBC 4.0 - 10.5 K/uL 2.9(L) 3.8(L) 3.2(L)  Hemoglobin 12.0 - 15.0 g/dL 7.5(L) 7.9(L) 8.2(L)  Hematocrit 36 - 46 % 20.7(L) 21.8(L) 23.9(L)  Platelets 150 - 400 K/uL 136(L) 134(L) 169    US RENAL  Result Date: 05/31/2020 CLINICAL DATA:  Acute kidney injury. EXAM: RENAL / URINARY TRACT ULTRASOUND COMPLETE COMPARISON:  CT abdomen pelvis 03/07/2020 FINDINGS: Right Kidney: Renal measurements: 13.3 x 5.3 x 6.2 cm = volume: 226 mL. Echogenicity within normal limits. No mass or hydronephrosis visualized. Left Kidney: Renal measurements: 11.3 x 5.8 x 4.9 cm = volume: 185 mL. Poor visualization secondary to patient body habitus. No definite hydronephrosis, renal calculus or renal mass. Bladder: Not visualized. Other: None. IMPRESSION: Limited evaluation particularly of the left kidney due to patient body habitus. No definite acute finding. Electronically Signed   By: Audie Pinto M.D.   On: 05/31/2020  19:52   DG Chest Port 1 View  Result Date: 05/31/2020 CLINICAL DATA:  Left arm and hand swelling for several days. EXAM: PORTABLE CHEST 1 VIEW COMPARISON:  04/29/2020 FINDINGS: There is a stable right-sided subclavian Port-A-Cath. The heart size remains enlarged but stable from prior study. Aortic calcifications are noted. There is probable mild vascular congestion without overt pulmonary edema. There is no pneumothorax. Surgical clips are noted in the patient's left axilla which may related to prior lymph node dissection. IMPRESSION: Cardiomegaly with mild pulmonary vascular congestion. No overt pulmonary edema. Electronically Signed   By: Constance Holster M.D.   On: 05/31/2020 19:27   UE VENOUS DUPLEX ([facility] & [facility] 7 am - 7 pm)  Result Date: 05/31/2020 UPPER VENOUS STUDY  Indications: Edema Limitations: Bandages, line, poor ultrasound/tissue interface, body habitus and edema, patient's somnolence. Comparison Study: No prior study Performing Technologist: Sharion Dove RVS  Examination Guidelines: A  complete evaluation includes B-mode imaging, spectral Doppler, color Doppler, and power Doppler as needed of all accessible portions of each vessel. Bilateral testing is considered an integral part of a complete examination. Limited examinations for reoccurring indications may be performed as noted.  Left Findings: +----------+------------+---------+-----------+----------+-------+ LEFT      CompressiblePhasicitySpontaneousPropertiesSummary +----------+------------+---------+-----------+----------+-------+ IJV                      Yes       Yes                      +----------+------------+---------+-----------+----------+-------+ Subclavian               Yes       Yes                      +----------+------------+---------+-----------+----------+-------+ Axillary                 Yes       Yes                      +----------+------------+---------+-----------+----------+-------+  Brachial      Full       Yes       Yes                      +----------+------------+---------+-----------+----------+-------+ Cephalic      Full                                          +----------+------------+---------+-----------+----------+-------+ Basilic                  Yes       Yes                      +----------+------------+---------+-----------+----------+-------+  Summary:  Left: No obvious evidence of deep vein thrombosis in the upper extremity. No obvious evidence of superficial vein thrombosis in the upper extremity.  *See table(s) above for measurements and observations.    Preliminary     Review of Systems  Constitutional: Positive for activity change, appetite change and fatigue. Negative for chills and fever.  HENT: Negative for ear pain, facial swelling, nosebleeds, sinus pressure and trouble swallowing.   Eyes: Negative for photophobia, pain and redness.  Respiratory: Positive for shortness of breath. Negative for cough, chest tightness and wheezing.        Chronic shortness of breath-unchanged  Cardiovascular: Positive for leg swelling. Negative for chest pain and palpitations.  Gastrointestinal: Positive for abdominal distention. Negative for abdominal pain, constipation, diarrhea, nausea and vomiting.  Genitourinary: Negative for dysuria, flank pain, hematuria and pelvic pain.  Musculoskeletal: Positive for back pain and gait problem. Negative for arthralgias and myalgias.  Neurological: Positive for light-headedness. Negative for dizziness, speech difficulty and headaches.  Psychiatric/Behavioral: Negative for confusion. The patient is nervous/anxious.    Blood pressure 125/75, pulse 67, temperature 97.7 F (36.5 C), temperature source Oral, resp. rate 16, height 5\' 4"  (1.626 m), weight (!) 140.6 kg, SpO2 98 %. Physical Exam Vitals and nursing note reviewed.  Constitutional:      Appearance: She is obese. She is ill-appearing.  HENT:     Head:  Normocephalic and atraumatic.     Right Ear: External ear normal.     Left Ear: External ear  normal.     Nose: Nose normal.     Mouth/Throat:     Mouth: Mucous membranes are moist.     Pharynx: Oropharynx is clear.  Eyes:     General: No scleral icterus.    Conjunctiva/sclera: Conjunctivae normal.     Pupils: Pupils are equal, round, and reactive to light.  Neck:     Comments: 11 cm JVP Cardiovascular:     Rate and Rhythm: Normal rate and regular rhythm.     Heart sounds: Normal heart sounds. No murmur heard.  No friction rub.     Comments: Radiation associated hyperpigmentation noted over chest Pulmonary:     Breath sounds: Rales present.     Comments: Fine rales left base, incomplete inspiratory effort Abdominal:     General: Bowel sounds are normal. There is distension.     Palpations: Abdomen is soft.     Tenderness: There is no abdominal tenderness.     Comments: Obese abdomen with large pannus  Musculoskeletal:     Cervical back: Normal range of motion and neck supple.     Right lower leg: Edema present.     Left lower leg: Edema present.     Comments: 2-3+ pitting edema with skin blistering/lichenification  Skin:    General: Skin is warm and dry.     Findings: Lesion present.  Neurological:     Mental Status: She is alert and oriented to person, place, and time.  Psychiatric:        Mood and Affect: Mood normal.     Assessment/Plan: 1.  Acute kidney injury on chronic kidney disease stage III: Nonoliguric.  This appears to be hemodynamically mediated with significant interstitial fluid and likely intravascular volume contraction in the setting of NSAID use.  This is corroborated by her low fractional excretion of sodium suggesting poor renal perfusion.  She is hypervolemic on exam and management will be undertaken with intravenous furosemide to help volume unloading.  She does not have any clear evidence of acute GN based on urine microscopy or obstruction on renal  ultrasound.  She does not have any acute indications for dialysis. 2.  Hyponatremia: This is true hyponatremia based on her low serum osmolality and is hypervolemic hyponatremia.  The transient improvement of her sodium level with isotonic fluids is reflective of the desaliation phenomenon seen with activated ADH states.  Will attempt to improve sodium with diuresis.  I have informed her to stay away from NSAIDs such as ibuprofen as these not only worsen renal function/cause sodium retention but can also worsen hyponatremia. 3.  Hypokalemia: Secondary to isotonic fluids/increased urine output with potassium dumping.  Will replace potassium via oral route.  Replacement of potassium will further improve serum osmolality/sodium handling. 4.  History of breast cancer: Ongoing anastrozole/Decadron. 5.  Anemia/pancytopenia: Likely secondary to chronic disease versus prior chemotherapy.  Iron stores appear to be replete, continue close monitoring.  Robyne Matar K. 06/01/2020, 11:37 AM

## 2020-06-01 NOTE — Consult Note (Addendum)
[facility] Nurse Consult Note: Reason for Consult:Patient is well known to our service for multiple past admissions. Last seen by my partner, D. Engels on 04/29/20.  Intertriginous dermatitis in the subpannicular skin fold improves with use of our house (silver) antimicrobial textile, [employer]pp Kellie Simmering # [phone]). This has been difficult for her to obtain as an outpatient. Additionally, patient reports being increasingly weak; due to her body habitus and decreased mobility, I will provide Orders for her to be placed on a hospital, bariatric mattress replacement with low air loss feature. This will mitigate moisture. Patient will still require turning and repositioning and assistance to use the [facility] for bowel and bladder management. Wound type: Moisture, ITD (Intertriginous dermatitis) Pressure Injury POA: N/A Drainage (amount, consistency, odor) with odor consistent with skin fold skin injury, musty Periwound: mild maceration Dressing procedure/placement/frequency: I have provided guidance for the skin care of the skin fold using soap and water to cleanse, water to rinse and to thoroughly, but gently dry. Following cleansing, the antimicrobial (silver) impregnated, house wicking textile (InterDry) is to be used according to [employer], which are provided in the Orders.  As previously noted, this nice patient requires assistance to use the [facility] and to effectively turn and reposition in bed. Her heels should be floated.  An order is placed for use of the prophylactic foam dressing to the sacrum, turning and repositioning and placement of a mattress replacement (bed, bariatric), today.  Addendum:  Reconsulted later in the day for dry cell desquamation to left chest (post radiation therapy). Provided Nursing with an Order for twice daily application of Aquaphor ointment.  [facility] nursing team will not follow, but will remain available to this patient, the nursing and medical teams.  Please re-consult if  needed. Thanks, Maudie Flakes, MSN, RN, [place], [facility]  Pager# [phone]

## 2020-06-02 ENCOUNTER — Other Ambulatory Visit: Payer: Self-pay

## 2020-06-02 DIAGNOSIS — N179 Acute kidney failure, unspecified: Secondary | ICD-10-CM | POA: Diagnosis not present

## 2020-06-02 MED ORDER — ENSURE PRE-SURGERY PO LIQD
296.0000 mL | Freq: Once | ORAL | Status: AC
  Administered 2020-06-02: 296 mL via ORAL
  Filled 2020-06-02: qty 296

## 2020-06-02 MED ORDER — FUROSEMIDE 10 MG/ML IJ SOLN
100.0000 mg | Freq: Three times a day (TID) | INTRAVENOUS | Status: DC
  Administered 2020-06-02 – 2020-06-03 (×3): 100 mg via INTRAVENOUS
  Filled 2020-06-02 (×6): qty 10

## 2020-06-02 MED ORDER — CHLORHEXIDINE GLUCONATE CLOTH 2 % EX PADS
6.0000 | MEDICATED_PAD | Freq: Every day | CUTANEOUS | Status: DC
  Administered 2020-06-02 – 2020-06-12 (×11): 6 via TOPICAL

## 2020-06-02 MED ORDER — GABAPENTIN 100 MG PO CAPS
100.0000 mg | ORAL_CAPSULE | Freq: Two times a day (BID) | ORAL | Status: DC
  Administered 2020-06-02 – 2020-06-12 (×20): 100 mg via ORAL
  Filled 2020-06-02 (×20): qty 1

## 2020-06-02 NOTE — Patient Outreach (Signed)
Care Coordination  06/02/2020  [name] 09-27-1955 [id number]   [name] is currently admitted as an inpatient at [facility] team will follow the progress of [name] and follow up upon discharge.   Aida Raider RN, BSN [place]  [facility] - Managed Medicaid High Risk [phone]

## 2020-06-02 NOTE — Progress Notes (Signed)
HEMATOLOGY-ONCOLOGY PROGRESS NOTE  Patient Care Team: [name], MD as PCP - General (Family Medicine) Mauro Kaufmann, RN as Oncology Nurse Navigator Rockwell Germany, RN as Oncology Nurse Navigator [name], MD as Consulting Physician (General Surgery) Magrinat, Virgie Dad, MD as Consulting Physician (Oncology) Kyung Rudd, MD as Consulting Physician (Radiation Oncology) Edrick Kins, DPM as Consulting Physician (Podiatry) Chauncey Cruel, MD  SUBJECTIVE: Neaveh has been admitted with bilateral lower extremity edema.  Her feet have been burning and it hurts to stand up.  When seen today, she is resting quietly.  She offers no complaints to me.  Oncology History  Malignant neoplasm of upper-outer quadrant of left breast in female, estrogen receptor positive ([place])  11/14/2019 Initial Diagnosis   Malignant neoplasm of upper-outer quadrant of left breast in female, estrogen receptor positive ([place])   11/29/2019 Genetic Testing   Negative genetic testing:  No pathogenic variants detected on the [facility] Breast Cancer STAT Panel or the Common Hereditary Cancers Panel. The report date is 11/29/2019.  The Breast Cancer STAT Panel offered by [facility] includes sequencing and deletion/duplication analysis for the following 9 genes:  ATM, BRCA1, BRCA2, CDH1, CHEK2, PALB2, PTEN, STK11 and TP53. The Common Hereditary Cancers Panel offered by [facility] includes sequencing and/or deletion duplication testing of the following 48 genes: APC, ATM, AXIN2, BARD1, BMPR1A, BRCA1, BRCA2, BRIP1, CDH1, CDK4, CDKN2A (p14ARF), CDKN2A (p16INK4a), CHEK2, CTNNA1, DICER1, EPCAM (Deletion/duplication testing only), GREM1 (promoter region deletion/duplication testing only), KIT, MEN1, MLH1, MSH2, MSH3, MSH6, MUTYH, NBN, NF1, NHTL1, PALB2, PDGFRA, PMS2, POLD1, POLE, PTEN, RAD50, RAD51C, RAD51D, RNF43, SDHB, SDHC, SDHD, SMAD4, SMARCA4. STK11, TP53, TSC1, TSC2, and VHL.  The following genes were evaluated for sequence  changes only: SDHA and HOXB13 c.251G>A variant only.   12/19/2019 Cancer Staging   Staging form: Breast, AJCC 8th Edition - Pathologic stage from 12/19/2019: Stage IIA (pT2, pN1a, cM0, G3, ER+, PR+, HER2-) - Signed by Gardenia Phlegm, NP on 01/02/2020   02/05/2020 -  Chemotherapy   The patient had dexamethasone (DECADRON) 4 MG tablet, 1 of 1 cycle, Start date: 01/17/2020, End date: 01/28/2020 DOXOrubicin (ADRIAMYCIN) chemo injection 100 mg, 40 mg/m2 = 100 mg (66.7 % of original dose 60 mg/m2), Intravenous,  Once, 2 of 2 cycles Dose modification: 50 mg/m2 (original dose 60 mg/m2, Cycle 1, Reason: Provider Judgment), 40 mg/m2 (original dose 60 mg/m2, Cycle 1, Reason: Provider Judgment), 50 mg/m2 (original dose 60 mg/m2, Cycle 2, Reason: Provider Judgment) Administration: 100 mg (02/05/2020), 124 mg (02/19/2020) palonosetron (ALOXI) injection 0.25 mg, 0.25 mg, Intravenous,  Once, 2 of 2 cycles Administration: 0.25 mg (02/05/2020), 0.25 mg (02/19/2020) pegfilgrastim-jmdb (FULPHILA) injection 6 mg, 6 mg, Subcutaneous,  Once, 2 of 2 cycles Administration: 6 mg (02/07/2020), 6 mg (02/21/2020) cyclophosphamide (CYTOXAN) 1,000 mg in sodium chloride 0.9 % 250 mL chemo infusion, 400 mg/m2 = 1,000 mg (66.7 % of original dose 600 mg/m2), Intravenous,  Once, 2 of 2 cycles Dose modification: 500 mg/m2 (original dose 600 mg/m2, Cycle 1, Reason: Provider Judgment), 400 mg/m2 (original dose 600 mg/m2, Cycle 1, Reason: Provider Judgment), 500 mg/m2 (original dose 600 mg/m2, Cycle 2, Reason: Provider Judgment) Administration: 1,000 mg (02/05/2020), 1,240 mg (02/19/2020) PACLitaxel (TAXOL) 198 mg in sodium chloride 0.9 % 250 mL chemo infusion (</= 17m/m2), 80 mg/m2 = 198 mg, Intravenous,  Once, 0 of 12 cycles fosaprepitant (EMEND) 150 mg in sodium chloride 0.9 % 145 mL IVPB, 150 mg, Intravenous,  Once, 2 of 2 cycles Administration: 150 mg (02/05/2020), 150 mg (  02/19/2020)  for chemotherapy treatment.       REVIEW OF  SYSTEMS:   Constitutional: Denies fevers, chills Respiratory: Denies cough, dyspnea or wheezes Cardiovascular: Denies palpitation, chest discomfort Gastrointestinal:  Denies nausea, heartburn or change in bowel habits Skin: Denies abnormal skin rashes Lymphatics: Denies new lymphadenopathy or easy bruising Neurological: Reports numbness in her feet Behavioral/Psych: Mood is stable, no new changes  Extremities: Has ongoing lower extremity edema All other systems were reviewed with the patient and are negative.  I have reviewed the past medical history, past surgical history, social history and family history with the patient and they are unchanged from previous note.   PHYSICAL EXAMINATION: ECOG PERFORMANCE STATUS: 2 - Symptomatic, <50% confined to bed  Vitals:   06/02/20 0857 06/02/20 0910  BP: (!) 144/99   Pulse: 81   Resp: 16 15  Temp:    SpO2: 99%    Filed Weights   05/31/20 1659  Weight: (!) 140.6 kg    Intake/Output from previous day: No intake/output data recorded.  GENERAL:alert, no distress and comfortable LUNGS: clear to auscultation and percussion with normal breathing effort HEART: regular rate & rhythm and no murmurs with bilateral lower extremity edema up to her thighs ABDOMEN:abdomen soft, non-tender and normal bowel sounds NEURO: alert & oriented x 3 with fluent speech, no focal motor/sensory deficits  LABORATORY DATA:  I have reviewed the data as listed CMP Latest Ref Rng & Units 06/01/2020 06/01/2020 06/01/2020  Glucose 70 - 99 mg/dL 94 81 80  BUN 8 - 23 mg/dL 53(H) 53(H) 52(H)  Creatinine 0.44 - 1.00 mg/dL 4.19(H) 4.15(H) 4.29(H)  Sodium 135 - 145 mmol/L 121(L) 119(LL) 120(L)  Potassium 3.5 - 5.1 mmol/L 3.5 3.2(L) 3.3(L)  Chloride 98 - 111 mmol/L 88(L) 88(L) 89(L)  CO2 22 - 32 mmol/L 17(L) 16(L) 16(L)  Calcium 8.9 - 10.3 mg/dL 8.4(L) 8.1(L) 8.1(L)  Total Protein 6.5 - 8.1 g/dL - - 6.6  Total Bilirubin 0.3 - 1.2 mg/dL - - 1.3(H)  Alkaline Phos 38 -  126 U/L - - 87  AST 15 - 41 U/L - - 40  ALT 0 - 44 U/L - - 24    Lab Results  Component Value Date   WBC 2.9 (L) 06/01/2020   HGB 7.5 (L) 06/01/2020   HCT 20.7 (L) 06/01/2020   MCV 95.0 06/01/2020   PLT 136 (L) 06/01/2020   NEUTROABS 1.8 06/01/2020    US RENAL  Result Date: 05/31/2020 CLINICAL DATA:  Acute kidney injury. EXAM: RENAL / URINARY TRACT ULTRASOUND COMPLETE COMPARISON:  CT abdomen pelvis 03/07/2020 FINDINGS: Right Kidney: Renal measurements: 13.3 x 5.3 x 6.2 cm = volume: 226 mL. Echogenicity within normal limits. No mass or hydronephrosis visualized. Left Kidney: Renal measurements: 11.3 x 5.8 x 4.9 cm = volume: 185 mL. Poor visualization secondary to patient body habitus. No definite hydronephrosis, renal calculus or renal mass. Bladder: Not visualized. Other: None. IMPRESSION: Limited evaluation particularly of the left kidney due to patient body habitus. No definite acute finding. Electronically Signed   By: Audie Pinto M.D.   On: 05/31/2020 19:52   DG Chest Port 1 View  Result Date: 05/31/2020 CLINICAL DATA:  Left arm and hand swelling for several days. EXAM: PORTABLE CHEST 1 VIEW COMPARISON:  04/29/2020 FINDINGS: There is a stable right-sided subclavian Port-A-Cath. The heart size remains enlarged but stable from prior study. Aortic calcifications are noted. There is probable mild vascular congestion without overt pulmonary edema. There is no pneumothorax. Surgical clips  are noted in the patient's left axilla which may related to prior lymph node dissection. IMPRESSION: Cardiomegaly with mild pulmonary vascular congestion. No overt pulmonary edema. Electronically Signed   By: Constance Holster M.D.   On: 05/31/2020 19:27   UE VENOUS DUPLEX ([facility] & [facility] 7 am - 7 pm)  Result Date: 06/01/2020 UPPER VENOUS STUDY  Indications: Edema Limitations: Bandages, line, poor ultrasound/tissue interface, body habitus and edema, patient's somnolence. Comparison Study: No prior study  Performing Technologist: Sharion Dove RVS  Examination Guidelines: A complete evaluation includes B-mode imaging, spectral Doppler, color Doppler, and power Doppler as needed of all accessible portions of each vessel. Bilateral testing is considered an integral part of a complete examination. Limited examinations for reoccurring indications may be performed as noted.  Left Findings: +----------+------------+---------+-----------+----------+-------+ LEFT      CompressiblePhasicitySpontaneousPropertiesSummary +----------+------------+---------+-----------+----------+-------+ IJV                      Yes       Yes                      +----------+------------+---------+-----------+----------+-------+ Subclavian               Yes       Yes                      +----------+------------+---------+-----------+----------+-------+ Axillary                 Yes       Yes                      +----------+------------+---------+-----------+----------+-------+ Brachial      Full       Yes       Yes                      +----------+------------+---------+-----------+----------+-------+ Cephalic      Full                                          +----------+------------+---------+-----------+----------+-------+ Basilic                  Yes       Yes                      +----------+------------+---------+-----------+----------+-------+  Summary:  Left: No obvious evidence of deep vein thrombosis in the upper extremity. No obvious evidence of superficial vein thrombosis in the upper extremity.  *See table(s) above for measurements and observations.  Diagnosing physician: Harold Barban MD Electronically signed by Harold Barban MD on 06/01/2020 at 7:43:29 PM.    Final     ASSESSMENT AND PLAN: ASSESSMENT: 64 y.o. [place] woman status post left breast upper outer quadrant and left axillary lymph node biopsies 11/12/2019 for a clinical T2 N2, stage IIIA invasive ductal carcinoma,  grade 3, estrogen and progesterone receptor positive, HER-2 not amplified, with an MIB-1 of 70%             (a) the biopsied axillary lymph node was positive  (1) genetics testing 11/29/2019 through the Breast Cancer STAT Panel offered by [facility] found no deleterious mutations in ATM, BRCA1, BRCA2, CDH1, CHEK2, PALB2, PTEN, STK11 and TP53.  Additional testing through the Common Hereditary Cancers Panel confirmed no deleterious mutations in APC, ATM,  AXIN2, BARD1, BMPR1A, BRCA1, BRCA2, BRIP1, CDH1, CDK4, CDKN2A (p14ARF), CDKN2A (p16INK4a), CHEK2, CTNNA1, DICER1, EPCAM (Deletion/duplication testing only), GREM1 (promoter region deletion/duplication testing only), KIT, MEN1, MLH1, MSH2, MSH3, MSH6, MUTYH, NBN, NF1, NHTL1, PALB2, PDGFRA, PMS2, POLD1, POLE, PTEN, RAD50, RAD51C, RAD51D, RNF43, SDHB, SDHC, SDHD, SMAD4, SMARCA4. STK11, TP53, TSC1, TSC2, and VHL.  The following genes were evaluated for sequence changes only: SDHA and HOXB13 c.251G>A variant only.  (2) status post left modified radical mastectomy 12/19/2019 for a pT2 pN1, stage IIB invasive ductal carcinoma, grade 3, with negative margins.             (a) 1 of 13 axillary lymph nodes removed was positive  (3) adjuvant chemotherapy will consist of cyclophosphamide and doxorubicin in dose dense fashion x4 starting 01/22/2020 followed by weekly paclitaxel x12             (a) baseline echocardiogram 11/26/2019 shows an ejection fraction in the 65-70% range.             (b) first cycle of cyclophosphamide and doxorubicin dose reduced to assess tolerance             (c) chemotherapy discontinued after 2 cycles because of poor tolerance  (4) adjuvant radiation ongoing, to be completed 05/21/2020  (5) anastrozole started July 2021   PLAN: Lundon is now admitted secondary to hypovolemic hyponatremia.  She has persistent bilateral lower extremity edema.  She has been seen by nephrology and is receiving Lasix and albumin.  She was noted to  have pancytopenia on labs performed on 10/3.  Labs from today have not yet been drawn.  Pancytopenia is not related to chemotherapy as she has not received chemotherapy since February 21, 2020.  She is only receiving anastrozole at this point which would not cause pancytopenia.  She is not iron deficient and vitamin B12 and folate levels are adequate.  I suspect her anemia is related to renal insufficiency.  Recommend PRBC transfusion for hemoglobin less than 7.  Leukopenia and thrombocytopenia likely due to acute illness and possible bone marrow suppression from alcohol use (drinks 2-3 drinks per day).  Recommend close monitoring of CBC.   LOS: 2 days   Mikey Bussing, DNP, AGPCNP-BC, AOCNP 06/02/20

## 2020-06-02 NOTE — Telephone Encounter (Signed)
Patient has been set up with appointment with PCP

## 2020-06-02 NOTE — Progress Notes (Signed)
[facility] Progress Note  Subjective: 550 cc UOP yest. No labs today.   BP's up at 155/ 95 range. RR and HR are wnl. Seen in room, lying flat no SOB. No hx CHF or renal failure per pt.   Vitals:   06/02/20 0930 06/02/20 1113 06/02/20 1307 06/02/20 1504  BP: 139/85 (!) 126/106 (!) 136/96 (!) 152/95  Pulse: 79 66 70 72  Resp: '18 18 20 ' (!) 24  Temp:      TempSrc:      SpO2: 99% 99% 100% 96%  Weight:      Height:        Exam:  alert, nad , morbidly obese  no jvd  Chest rales L base, poor resp effort  Cor reg no RG  Abd soft ntnd no ascites, +dependent pannus edema   Ext 2-3 + ptting LE hip and pretib dependent pitting edema   Alert, NF, ox3, no asterixis      Date   Creat   eGFR   2009- 2018  0.67- 1.00   2020   0.66- 1.30   10/2019  0.70   10/2019  0.66   11/2019  1.26- 2.40   01/02/2020  1.48   5/25- 01/28/20  6.02 >> 1.98  AKI episode due to vol depletion and nephrotoxic agents   01/2020  1.2- 1.7  35- 55 ml/min     02/2020  0.80- 1.67  39- > 60 ml/min     03/2020  1.20   04/2020  1.07- 2.77   Oct 2- 3, 2021 4.49 >> 4.19 today        Renal US >>   11- 13 cm kidneys w/o hydro and normal echo, L kidney not well visualized due to body habitus     UA  10/2 >> 100 prot, 0-5 rbc/ wbc, many bact      Serum Osm 270-274      UNa < 10 on 10/2, Uosm 198      UCr 176 on 10/2     10/3 - Na 121 K 3.5  CO2 17  BUN 53  Cr 4.19  Ca 8.4, Alb 3.0, tprot 6.6                   WBC 2.9, Hb 7.5  plt 136        CXR 10/2 - read as vasc congestion, but appears normal to me     ECHO last done June 2021 - LVEF 60-70%, G1DD   Assessment/ Plan: 1. AoCKD 3 - vol overloaded in setting of nsaid use, possibly hemodynamic. BP's are high, large LE edema, low UNa. UA normal which goes against GN, no obstruction on [place]. No acute HD indications. CXR clear from 10/02. Continue IV lasix, place foley, tight I/O's needed. Will get echo, not sure why she is so edematous.  2. Hyponatremia -  hypervolemic w/ edematous state. IV lasix 185m q 8 hrs now. Has foley in place.  Feeling better so far w/ lasix. Na+ still low, cont lasix.  3. Hx breast cancer 4. Anemia/ pancytopenia - not sure cause, hx prior chemoRx.  Get myeloma studies.      Rob Donovan Persley 06/02/2020, 4:34 PM   Recent Labs  Lab 05/31/20 1534 05/31/20 1534 05/31/20 1923 06/01/20 0153 06/01/20 0423 06/01/20 0423 06/01/20 0700 06/01/20 1411  K 3.5   < > 3.6   < > 3.3*   < > 3.2* 3.5  BUN 51*   < >  48*   < > 52*   < > 53* 53*  CREATININE 4.49*   < > 4.32*   < > 4.29*   < > 4.15* 4.19*  CALCIUM 8.1*   < > 8.4*   < > 8.1*   < > 8.1* 8.4*  PHOS  --   --  6.2*  --  5.8*  --   --   --   HGB 7.9*  --   --   --  7.5*  --   --   --    < > = values in this interval not displayed.   Inpatient medications: . anastrozole  1 mg Oral Daily  . carvedilol  12.5 mg Oral BID WC  . dexamethasone  4 mg Oral Daily  . docusate sodium  100 mg Oral BID  . folic acid  1 mg Oral Daily  . gabapentin  100 mg Oral BID  . heparin  5,000 Units Subcutaneous Q8H  . mineral oil-hydrophilic petrolatum   Topical BID  . multivitamin with minerals  1 tablet Oral Daily  . nystatin   Topical TID  . sodium chloride flush  3 mL Intravenous Q12H  . sodium chloride flush  3 mL Intravenous Q12H  . thiamine  100 mg Oral Daily   Or  . thiamine  100 mg Intravenous Daily   . sodium chloride     sodium chloride, acetaminophen **OR** acetaminophen, HYDROcodone-acetaminophen, LORazepam **OR** LORazepam, ondansetron **OR** ondansetron (ZOFRAN) IV, polyethylene glycol, sodium chloride flush

## 2020-06-02 NOTE — Progress Notes (Signed)
PROGRESS NOTE  [name] ZOX:[id number] DOB: [birth year] DOA: 05/31/2020 PCP: Sue Blackbird, MD  HPI/Recap of past 24 hours: [name] is a 64 y.o. female with medical history significant of breast CA sp Mastectomy and radiation on the left, morbid obesity, HTN, multiple skin ulcers, Alcohol liver disease CKD stage IIIa, Grade 1 diastolic CHF.  Presented with bilateral leg swelling and burning on the bottom.  She has been urinating a bit more than usual.  Reports she drinks 4 bottle of water a day and ensure.  She has no appetite and has not been eating.  She gained fluid weight.  Reports her ulcers have been healing.  She can walk a bit uses wheelchair.  She still drinks few mixed drinks a day 2-3/day no hx of withdraw.  Last left breast radiation therapy was 3 days ago  Reports feet are burning.  Hurts to stand up.  No diarrhea no melena no blood in stool.  Work up revealed No evidence of DVT, Hyponatremia and acute on chronic CKD3B.  TRH was asked to admit.  Nephrology was consulted and is following.  06/02/20: Seen and examined in her room in the ED. reports burning in her feet.  Low-dose gabapentin 100 mg twice daily started, judicious due to AKI.     Assessment/Plan: Active Problems:   Essential hypertension   Alcohol dependence ([place])   Edema   Malignant neoplasm of upper-outer quadrant of left breast in female, estrogen receptor positive ([place])   Morbid obesity with BMI of 60.0-69.9, adult (HCC)   Acute renal failure (ARF) (HCC)   Thrombocytopenia (HCC)   Debility   Anemia of chronic disease   Chronic kidney disease, stage 3a (HCC)   Hyponatremia   Pancytopenia (HCC)   Breast cancer (HCC)  Hypervolemic hyponatremia Reports drinking 4 bottles of water and Ensure daily Presented with serum sodium 119, low serum osmolality, hypervolemic on exam Hold off IV fluids as recommended by nephrology Trend serum Na+  Nonoliguric AKI on CKD 3B Baseline creatinine appears to be  2.7 Presented with creatinine of greater than 4 with GFR of 12 Avoid nephrotoxins and hypotension Monitor urine output  Management per nephrology Daily renal panel  Anemia of chronic disease secondary to CKD No overt bleeding Monitor H&H  Leukopenia/thrombocytopenia likely secondary to acute illness Agree with hematology oncology Observe and trend  Resolved post repletion: Hypokalemia Potassium 3.2 Replete orally with 40 mEq x1 Judicious replacement of potassium due to significant renal insufficiency  Isolated hyperbilirubinemia Monitor and trend level  Chronic diastolic CHF Last 2D echo done on 02/11/2020 showed LVEF 70 to 25% and grade 1 diastolic dysfunction Strict I's and O's, daily weights.  Physical debility PT to assess Fall precautions  History of breast cancer/radiation Last radiation 3 days prior to presentation affecting left breast, Nodes noted on exam, POA Wound care specialist for local wound care recommendations Continue local wound care  Severe morbid obesity BMI 53 Recommend weight loss outpatient when stable from her acute illness and her cancer Will benefit from referral to bariatric weight loss center  Code Status: Full code  Family Communication: None at bedside  Disposition Plan: We will discharge to home with home health services versus SNF.   Consultants:  Nephrology.  Procedures:  None  Antimicrobials:  None   DVT prophylaxis: Subcu heparin 3 times daily  Status is: Inpatient  Home.  Dispo: The patient is from: Home.  Anticipated d/c is to: Home with home health services PT OT RN versus SNF.               Anticipated d/c date is: 06/03/2020.              Patient currently not stable for discharge to home: Management for AKI on CKD 3B.         Objective: Vitals:   06/02/20 0930 06/02/20 1113 06/02/20 1307 06/02/20 1504  BP: 139/85 (!) 126/106 (!) 136/96 (!) 152/95  Pulse: 79 66 70 72  Resp: 18 18 20   (!) 24  Temp:      TempSrc:      SpO2: 99% 99% 100% 96%  Weight:      Height:       No intake or output data in the 24 hours ending 06/02/20 1530 Filed Weights   05/31/20 1659  Weight: (!) 140.6 kg    Exam:  . General: 64 y.o. year-old female pleasant severely obese in no acute distress.  Alert and oriented x3.   . Cardiovascular: Regular rate and rhythm no rubs or gallops.   Marland Kitchen Respiratory: Clear to auscultation no wheezes or rales.   . Abdomen: Soft nontender normal bowel sounds present.   . Musculoskeletal: Bilateral lower extremity edema up to the thighs. . Skin: Left chest burn from radiation.   Marland Kitchen Psychiatry: Mood is appropriate for condition and setting.   Data Reviewed: CBC: Recent Labs  Lab 05/31/20 1534 06/01/20 0423  WBC 3.8* 2.9*  NEUTROABS 2.5 1.8  HGB 7.9* 7.5*  HCT 21.8* 20.7*  MCV 93.6 95.0  PLT 134* 884*   Basic Metabolic Panel: Recent Labs  Lab 05/31/20 1923 06/01/20 0153 06/01/20 0423 06/01/20 0700 06/01/20 1411  NA 123* 123* 120* 119* 121*  K 3.6 3.5 3.3* 3.2* 3.5  CL 89* 89* 89* 88* 88*  CO2 15* 17* 16* 16* 17*  GLUCOSE 67* 77 80 81 94  BUN 48* 48* 52* 53* 53*  CREATININE 4.32* 4.13* 4.29* 4.15* 4.19*  CALCIUM 8.4* 8.5* 8.1* 8.1* 8.4*  MG 1.6*  --  1.9  --   --   PHOS 6.2*  --  5.8*  --   --    GFR: Estimated Creatinine Clearance: 19.1 mL/min (A) (by C-G formula based on SCr of 4.19 mg/dL (H)). Liver Function Tests: Recent Labs  Lab 05/31/20 1534 06/01/20 0423  AST 48* 40  ALT 28 24  ALKPHOS 94 87  BILITOT 0.9 1.3*  PROT 6.9 6.6  ALBUMIN 2.7* 3.0*   No results for input(s): LIPASE, AMYLASE in the last 168 hours. Recent Labs  Lab 05/31/20 1923  AMMONIA 39*   Coagulation Profile: Recent Labs  Lab 05/31/20 1534  INR 1.2   Cardiac Enzymes: No results for input(s): CKTOTAL, CKMB, CKMBINDEX, TROPONINI in the last 168 hours. BNP (last 3 results) No results for input(s): PROBNP in the last 8760 hours. HbA1C: No results  for input(s): HGBA1C in the last 72 hours. CBG: Recent Labs  Lab 05/31/20 2042 05/31/20 2245 06/01/20 0150  GLUCAP 68* 77 78   Lipid Profile: No results for input(s): CHOL, HDL, LDLCALC, TRIG, CHOLHDL, LDLDIRECT in the last 72 hours. Thyroid Function Tests: Recent Labs    06/01/20 0423 06/01/20 0800  TSH 4.685*  --   FREET4  --  1.13*   Anemia Panel: Recent Labs    06/01/20 0423  VITAMINB12 561  FOLATE 9.5  FERRITIN 951*  TIBC 133*  IRON 130  RETICCTPCT 1.0   Urine analysis:    Component Value Date/Time   COLORURINE YELLOW 05/31/2020 1923   APPEARANCEUR CLEAR 05/31/2020 1923   LABSPEC 1.012 05/31/2020 1923   PHURINE 5.0 05/31/2020 1923   GLUCOSEU NEGATIVE 05/31/2020 1923   HGBUR NEGATIVE 05/31/2020 1923   BILIRUBINUR NEGATIVE 05/31/2020 [place] NEGATIVE 05/31/2020 1923   PROTEINUR 100 (A) 05/31/2020 1923   UROBILINOGEN 1.0 07/14/2012 1630   NITRITE NEGATIVE 05/31/2020 1923   LEUKOCYTESUR NEGATIVE 05/31/2020 1923   Sepsis Labs: @LABRCNTIP (procalcitonin:4,lacticidven:4)  ) Recent Results (from the past 240 hour(s))  Respiratory Panel by RT PCR (Flu A&B, Covid) - Nasopharyngeal Swab     Status: None   Collection Time: 05/31/20  7:23 PM   Specimen: Nasopharyngeal Swab  Result Value Ref Range Status   SARS Coronavirus 2 by RT PCR NEGATIVE NEGATIVE Final    Comment: (NOTE) SARS-CoV-2 target nucleic acids are NOT DETECTED.  The SARS-CoV-2 RNA is generally detectable in upper respiratoy specimens during the acute phase of infection. The lowest concentration of SARS-CoV-2 viral copies this assay can detect is 131 copies/mL. A negative result does not preclude SARS-Cov-2 infection and should not be used as the sole basis for treatment or other patient management decisions. A negative result may occur with  improper specimen collection/handling, submission of specimen other than nasopharyngeal swab, presence of viral mutation(s) within the areas  targeted by this assay, and inadequate number of viral copies (<131 copies/mL). A negative result must be combined with clinical observations, patient history, and epidemiological information. The expected result is Negative.  Fact Sheet for Patients:  PinkCheek.be  Fact Sheet for Healthcare Providers:  GravelBags.it  This test is no t yet approved or cleared by the [place] FDA and  has been authorized for detection and/or diagnosis of SARS-CoV-2 by FDA under an Emergency Use Authorization (EUA). This EUA will remain  in effect (meaning this test can be used) for the duration of the COVID-19 declaration under Section 564(b)(1) of the Act, 21 U.S.C. section 360bbb-3(b)(1), unless the authorization is terminated or revoked sooner.     Influenza A by PCR NEGATIVE NEGATIVE Final   Influenza B by PCR NEGATIVE NEGATIVE Final    Comment: (NOTE) The Xpert Xpress SARS-CoV-2/FLU/RSV assay is intended as an aid in  the diagnosis of influenza from Nasopharyngeal swab specimens and  should not be used as a sole basis for treatment. Nasal washings and  aspirates are unacceptable for Xpert Xpress SARS-CoV-2/FLU/RSV  testing.  Fact Sheet for Patients: PinkCheek.be  Fact Sheet for Healthcare Providers: GravelBags.it  This test is not yet approved or cleared by the [place] FDA and  has been authorized for detection and/or diagnosis of SARS-CoV-2 by  FDA under an Emergency Use Authorization (EUA). This EUA will remain  in effect (meaning this test can be used) for the duration of the  Covid-19 declaration under Section 564(b)(1) of the Act, 21  U.S.C. section 360bbb-3(b)(1), unless the authorization is  terminated or revoked. Performed at [facility], [place] [address] [place], [place] [address]       Studies: No results found.  Scheduled  Meds: . anastrozole  1 mg Oral Daily  . carvedilol  12.5 mg Oral BID WC  . dexamethasone  4 mg Oral Daily  . docusate sodium  100 mg Oral BID  . folic acid  1 mg Oral Daily  . heparin  5,000 Units Subcutaneous Q8H  . mineral oil-hydrophilic petrolatum   Topical BID  .  multivitamin with minerals  1 tablet Oral Daily  . nystatin   Topical TID  . sodium chloride flush  3 mL Intravenous Q12H  . sodium chloride flush  3 mL Intravenous Q12H  . thiamine  100 mg Oral Daily   Or  . thiamine  100 mg Intravenous Daily    Continuous Infusions: . sodium chloride       LOS: 2 days     Kayleen Memos, MD [facility] Pager [phone]  If 7PM-7AM, please contact night-coverage [email or web address] Password [email or web address] 06/02/2020, 3:30 PM

## 2020-06-03 ENCOUNTER — Inpatient Hospital Stay (HOSPITAL_COMMUNITY): Payer: Medicaid Other

## 2020-06-03 DIAGNOSIS — R601 Generalized edema: Secondary | ICD-10-CM | POA: Diagnosis not present

## 2020-06-03 DIAGNOSIS — N189 Chronic kidney disease, unspecified: Secondary | ICD-10-CM

## 2020-06-03 DIAGNOSIS — Z170 Estrogen receptor positive status [ER+]: Secondary | ICD-10-CM | POA: Diagnosis not present

## 2020-06-03 DIAGNOSIS — N179 Acute kidney failure, unspecified: Secondary | ICD-10-CM

## 2020-06-03 DIAGNOSIS — C50412 Malignant neoplasm of upper-outer quadrant of left female breast: Secondary | ICD-10-CM | POA: Diagnosis not present

## 2020-06-03 LAB — BASIC METABOLIC PANEL
Anion gap: 16 — ABNORMAL HIGH (ref 5–15)
Anion gap: 13 (ref 5–15)
BUN: 57 mg/dL — ABNORMAL HIGH (ref 8–23)
BUN: 60 mg/dL — ABNORMAL HIGH (ref 8–23)
CO2: 18 mmol/L — ABNORMAL LOW (ref 22–32)
CO2: 20 mmol/L — ABNORMAL LOW (ref 22–32)
Calcium: 8.9 mg/dL (ref 8.9–10.3)
Calcium: 8.8 mg/dL — ABNORMAL LOW (ref 8.9–10.3)
Chloride: 91 mmol/L — ABNORMAL LOW (ref 98–111)
Chloride: 90 mmol/L — ABNORMAL LOW (ref 98–111)
Creatinine, Ser: 3.76 mg/dL — ABNORMAL HIGH (ref 0.44–1.00)
Creatinine, Ser: 3.62 mg/dL — ABNORMAL HIGH (ref 0.44–1.00)
GFR calc Af Amer: 14 mL/min — ABNORMAL LOW (ref >60)
GFR calc non Af Amer: 12 mL/min — ABNORMAL LOW (ref >60)
GFR calc non Af Amer: 13 mL/min — ABNORMAL LOW (ref >60)
Glucose, Bld: 146 mg/dL — ABNORMAL HIGH (ref 70–99)
Glucose, Bld: 172 mg/dL — ABNORMAL HIGH (ref 70–99)
Potassium: 3.8 mmol/L (ref 3.5–5.1)
Potassium: 3.6 mmol/L (ref 3.5–5.1)
Sodium: 125 mmol/L — ABNORMAL LOW (ref 135–145)
Sodium: 123 mmol/L — ABNORMAL LOW (ref 135–145)

## 2020-06-03 LAB — PROTEIN ELECTROPHORESIS, SERUM
A/G Ratio: 0.9 (ref 0.7–1.7)
Albumin ELP: 3.3 g/dL (ref 2.9–4.4)
Alpha-1-Globulin: 0.2 g/dL (ref 0.0–0.4)
Alpha-2-Globulin: 0.7 g/dL (ref 0.4–1.0)
Beta Globulin: 0.8 g/dL (ref 0.7–1.3)
Gamma Globulin: 1.8 g/dL (ref 0.4–1.8)
Globulin, Total: 3.6 g/dL (ref 2.2–3.9)
Total Protein ELP: 6.9 g/dL (ref 6.0–8.5)

## 2020-06-03 LAB — CBC
HCT: 20.9 % — ABNORMAL LOW (ref 36.0–46.0)
Hemoglobin: 7.7 g/dL — ABNORMAL LOW (ref 12.0–15.0)
MCH: 34.4 pg — ABNORMAL HIGH (ref 26.0–34.0)
MCHC: 36.8 g/dL — ABNORMAL HIGH (ref 30.0–36.0)
MCV: 93.3 fL (ref 80.0–100.0)
Platelets: 128 10*3/uL — ABNORMAL LOW (ref 150–400)
RBC: 2.24 MIL/uL — ABNORMAL LOW (ref 3.87–5.11)
RDW: 14.2 % (ref 11.5–15.5)
WBC: 2.9 10*3/uL — ABNORMAL LOW (ref 4.0–10.5)
nRBC: 0 % (ref 0.0–0.2)

## 2020-06-03 LAB — KAPPA/LAMBDA LIGHT CHAINS
Kappa free light chain: 131.4 mg/L — ABNORMAL HIGH (ref 3.3–19.4)
Kappa, lambda light chain ratio: 2.12 — ABNORMAL HIGH (ref 0.26–1.65)
Lambda free light chains: 62 mg/L — ABNORMAL HIGH (ref 5.7–26.3)

## 2020-06-03 MED ORDER — FUROSEMIDE 10 MG/ML IJ SOLN
120.0000 mg | Freq: Three times a day (TID) | INTRAVENOUS | Status: DC
  Administered 2020-06-03 – 2020-06-05 (×6): 120 mg via INTRAVENOUS
  Filled 2020-06-03 (×2): qty 12
  Filled 2020-06-03: qty 10
  Filled 2020-06-03 (×3): qty 12
  Filled 2020-06-03: qty 10

## 2020-06-03 MED ORDER — ENSURE ENLIVE PO LIQD
237.0000 mL | Freq: Two times a day (BID) | ORAL | Status: DC
  Administered 2020-06-03 – 2020-06-11 (×14): 237 mL via ORAL

## 2020-06-03 MED ORDER — KATE FARMS STANDARD 1.4 PO LIQD
325.0000 mL | Freq: Two times a day (BID) | ORAL | Status: DC
  Administered 2020-06-03 – 2020-06-12 (×16): 325 mL via ORAL
  Filled 2020-06-03 (×19): qty 325

## 2020-06-03 NOTE — Progress Notes (Signed)
Initial Nutrition Assessment  DOCUMENTATION CODES:   Morbid obesity  INTERVENTION:   - Ensure Enlive po BID, each supplement provides 350 kcal and 20 grams of protein -[employer] 1.4 po BID, each supplement provides 455 kcal and 20 grams protein.  -Multivitamin with minerals daily  NUTRITION DIAGNOSIS:   Increased nutrient needs related to cancer and cancer related treatments as evidenced by estimated needs.  GOAL:   Patient will meet greater than or equal to 90% of their needs  MONITOR:   PO intake, Supplement acceptance, Labs, Weight trends, Skin, I & O's  REASON FOR ASSESSMENT:   Consult Assessment of nutrition requirement/status  ASSESSMENT:   64 y.o. female with medical history significant of breast CA sp Mastectomy and radiation on the left, morbid obesity, HTN, multiple skin ulcers, Alcohol liver diseaseCKD stage IIIa, Grade 1 diastolic CHF.  Presented with bilateral leg swelling and burning on the bottom.  She has been urinating a bit more than usual.  Reports she drinks 4 bottle of water a day and ensure.  She has no appetite and has not been eating.  She gained fluid weight.  Reports her ulcers have been healing.  She can walk a bit uses wheelchair.  She still drinks few mixed drinks a day 2-3/day no hx of withdraw.  Last left breast radiation therapy was 3 days ago  Patient in room reports she has no appetite. She consumes 3-4 Ensure "Complete" daily with water and cocktails (vodka + juice) at nighttime. States she was told to watch her sodium intake and sugar intake.   Pt reports she cannot chew foods as she had her teeth removed prior to starting chemotherapy. Now that her treatments are complete, she plans to get dentures fitted. Suspects she will eat better once she has dentures.   Encouraged pt to try and eat something off her meal trays and to continue Ensure supplements 4 times daily to maintain her protein intake.  Pt reports she plans to cut out alcohol  completely. Encouraged pt to reach out to her doctor if she finds this difficult or starts having withdrawal symptoms.  Will order Ensure Enlive BID & Anda [employer] 1.4 BID (provides 1610 kcals and 80g protein). Will need to eat something on her trays to meet kcal needs. Pt declined chopped foods to help with chewing.  Per weight records, pt's weight has increased, most likely fluid related.  I/Os: -960 ml since admit UOP: 1675 ml x 24 hrs  Medications: Colace, Folic acid, Multivitamin with minerals daily, Thiamine, Lasix   Labs reviewed:  Low Na  NUTRITION - FOCUSED PHYSICAL EXAM:  No depletions.  Diet Order:   Diet Order            Diet Heart Room service appropriate? Yes; Fluid consistency: Thin  Diet effective now                 EDUCATION NEEDS:   Education needs have been addressed  Skin:  Skin Assessment: Skin Integrity Issues: Skin Integrity Issues:: Other (Comment) Other: Intertriginous dermatitis -per WOC note  Last BM:  10/4 -type 7  Height:   Ht Readings from Last 1 Encounters:  06/02/20 5\' 1"  (1.549 m)    Weight:   Wt Readings from Last 1 Encounters:  06/02/20 (!) 147 kg   BMI:  Body mass index is 61.23 kg/m.  Estimated Nutritional Needs:   Kcal:  1900-2100  Protein:  70-85g  Fluid:  1.9L/day  Clayton Bibles, MS, RD, LDN Inpatient  Clinical Dietitian Contact information available via Amion

## 2020-06-03 NOTE — Progress Notes (Addendum)
[facility] Progress Note  Subjective: 1000 L UOP yest, 580 cc so far today, feeling better, hand swelling is "gone".    Vitals:   06/02/20 2143 06/03/20 0127 06/03/20 0535 06/03/20 1252  BP: 124/80 106/67 115/69 139/75  Pulse: 76 72 79 72  Resp: '18 20 18 20  ' Temp:  98.8 F (37.1 C) 98.1 F (36.7 C) (!) 97.5 F (36.4 C)  TempSrc:  Oral Oral Oral  SpO2: 100% 100% 100% 100%  Weight:      Height:        Exam:  alert, nad , morbidly obese  no jvd  Chest rales L base, poor resp effort  Cor reg no RG  Abd soft ntnd no ascites, +dependent pannus edema   Ext 2-3 + ptting LE hip and pretib dependent pitting edema   Alert, NF, ox3, no asterixis      Date   Creat   eGFR   2009- 2018  0.67- 1.00   2020   0.66- 1.30   10/2019  0.70   10/2019  0.66   11/2019  1.26- 2.40   01/02/2020  1.48   5/25- 01/28/20  6.02 >> 1.98  AKI episode due to vol depletion and nephrotoxic agents   01/2020  1.2- 1.7  35- 55 ml/min     02/2020  0.80- 1.67  39- > 60 ml/min     03/2020  1.20   04/2020  1.07- 2.77   Oct 2- 3, 2021 4.49 >> 4.19 today        Renal US >>   11- 13 cm kidneys w/o hydro and normal echo, L kidney not well visualized due to body habitus     UA  10/2 >> 100 prot, 0-5 rbc/ wbc, many bact      Serum Osm 270-274      UNa < 10 on 10/2, Uosm 198      UCr 176 on 10/2     10/3 - Na 121 K 3.5  CO2 17  BUN 53  Cr 4.19  Ca 8.4, Alb 3.0, tprot 6.6                   WBC 2.9, Hb 7.5  plt 136        CXR 10/2 - read as vasc congestion, but appears normal to me     ECHO last done June 2021 - LVEF 60-70%, G1DD   Assessment/ Plan: 1. AoCKD 3a - b/l creat 1.2- 1.7, eGFR 35- 55 ml/min. AKI w/ creat here 4.4 on admission due to nsaids/ cardiorenal / hemodynamic. BP's are high, large R sided edema, low UNa. UA wnl so doubt GN, no obstruction on [place]. CXR clear from 10/02. Will get echo to eval LV/ RV. Creat down to 3.5 today w/ diuresis. Will ^lasix to 120 tid IV. Cont to diurese, looking  better today.  2. Hyponatremia - hypervolemic w/ edematous state. Na+ up to 125 today. Cont lasix.  3. Hx breast cancer 4. Anemia/ pancytopenia - not sure cause, hx prior chemoRx.  Get myeloma studies.      Rob Melenie Minniear 06/03/2020, 4:45 PM   Recent Labs  Lab 05/31/20 1923 06/01/20 0153 06/01/20 0423 06/01/20 0700 06/03/20 0100 06/03/20 1506  K 3.6   < > 3.3*   < > 3.8 3.6  BUN 48*   < > 52*   < > 57* 60*  CREATININE 4.32*   < > 4.29*   < > 3.76* 3.62*  CALCIUM 8.4*   < > 8.1*   < > 8.9 8.8*  PHOS 6.2*  --  5.8*  --   --   --   HGB  --   --  7.5*  --  7.7*  --    < > = values in this interval not displayed.   Inpatient medications: . anastrozole  1 mg Oral Daily  . carvedilol  12.5 mg Oral BID WC  . Chlorhexidine Gluconate Cloth  6 each Topical Daily  . dexamethasone  4 mg Oral Daily  . docusate sodium  100 mg Oral BID  . feeding supplement (ENSURE ENLIVE)  237 mL Oral BID BM  . feeding supplement (KATE FARMS STANDARD 1.4)  325 mL Oral BID BM  . folic acid  1 mg Oral Daily  . gabapentin  100 mg Oral BID  . heparin  5,000 Units Subcutaneous Q8H  . mineral oil-hydrophilic petrolatum   Topical BID  . multivitamin with minerals  1 tablet Oral Daily  . nystatin   Topical TID  . sodium chloride flush  3 mL Intravenous Q12H  . sodium chloride flush  3 mL Intravenous Q12H  . thiamine  100 mg Oral Daily   Or  . thiamine  100 mg Intravenous Daily   . sodium chloride    . furosemide 100 mg (06/03/20 1237)   sodium chloride, acetaminophen **OR** acetaminophen, HYDROcodone-acetaminophen, LORazepam **OR** LORazepam, ondansetron **OR** ondansetron (ZOFRAN) IV, polyethylene glycol, sodium chloride flush

## 2020-06-03 NOTE — Progress Notes (Signed)
PROGRESS NOTE  [name] VOZ:[id number] DOB: Feb 01, 1956 DOA: 05/31/2020 PCP: [name], MD  HPI/Recap of past 24 hours: [name] is a 64 y.o. female with medical history significant of breast CA sp Mastectomy and radiation on the left, morbid obesity, HTN, multiple skin ulcers, Alcohol liver disease CKD stage IIIa, Grade 1 diastolic CHF.  Presented with bilateral leg swelling and burning on the bottom.  She has been urinating a bit more than usual.  Reports she drinks 4 bottle of water a day and ensure.  She has no appetite and has not been eating.  She gained fluid weight.  Reports her ulcers have been healing.  She can walk a bit uses wheelchair.  She still drinks few mixed drinks a day 2-3/day no hx of withdraw.  Last left breast radiation therapy was 3 days ago  Reports feet are burning.  Hurts to stand up.  No diarrhea no melena no blood in stool.  Work up revealed No evidence of DVT, Hyponatremia and acute on chronic CKD3B.  TRH was asked to admit.  Nephrology was consulted and is following.  06/03/20: Feels better this AM.  Pain in her lower extremity improved with gabapentin.   Assessment/Plan: Active Problems:   Essential hypertension   Alcohol dependence ([place])   Edema   Malignant neoplasm of upper-outer quadrant of left breast in female, estrogen receptor positive ([place])   Morbid obesity with BMI of 60.0-69.9, adult (HCC)   Acute renal failure (ARF) (HCC)   Thrombocytopenia (HCC)   Debility   Anemia of chronic disease   Chronic kidney disease, stage 3a (HCC)   Hyponatremia   Pancytopenia (HCC)   Breast cancer (HCC)  Hypervolemic hyponatremia Reports drinking 4 bottles of water and Ensure daily Presented with serum sodium 119, low serum osmolality, hypervolemic on exam Hold off IV fluids as recommended by nephrology Ongoing diuresing, directed by nephrology Trend serum Na+  Improving, nonoliguric AKI on CKD 3B Baseline creatinine appears to be 2.7 Presented  with creatinine of greater than 4 with GFR of 12 Creatinine downtrending, 3.62 from 3.76 from 4.19. Avoid nephrotoxins and hypotension Monitor urine output  Management per nephrology Daily renal panel  Anemia of chronic disease secondary to CKD No overt bleeding Hemoglobin uptrending, 7.7 from 7.5 Monitor H&H  Leukopenia/thrombocytopenia likely secondary to acute illness Agree with hematology oncology Observe and trend  Resolved post repletion: Hypokalemia Potassium 3.2 Replete orally with 40 mEq x1 Judicious replacement of potassium due to AKI.  Isolated hyperbilirubinemia T bili 1.3 on 06/01/2020 Suspect reactive  Chronic diastolic CHF Last 2D echo done on 02/11/2020 showed LVEF 70 to 25% and grade 1 diastolic dysfunction Repeat 2D echo pending Strict I's and O's, daily weights.  Physical debility PT to assess Fall precautions  History of breast cancer/radiation Last radiation 3 days prior to presentation affecting left breast, Nodes noted on exam, POA Wound care specialist for local wound care recommendations Continue local wound care  Severe morbid obesity BMI 53 Recommend weight loss outpatient when stable from her acute illness and her cancer Will benefit from referral to bariatric weight loss center  Code Status: Full code  Family Communication: None at bedside  Disposition Plan: We will discharge to home with home health services versus SNF.   Consultants:  Nephrology.  Heme oncology  Procedures:  None  Antimicrobials:  None   DVT prophylaxis: Subcu heparin 3 times daily  Status is: Inpatient  Home.  Dispo: The patient is from: Home.  Anticipated d/c is to: Home with home health services PT OT RN versus SNF.               Anticipated d/c date is: 06/05/2020.              Patient currently not stable for discharge to home: Management for AKI on CKD 3B.         Objective: Vitals:   06/02/20 2143 06/03/20 0127  06/03/20 0535 06/03/20 1252  BP: 124/80 106/67 115/69 139/75  Pulse: 76 72 79 72  Resp: 18 20 18 20   Temp:  98.8 F (37.1 C) 98.1 F (36.7 C) (!) 97.5 F (36.4 C)  TempSrc:  Oral Oral Oral  SpO2: 100% 100% 100% 100%  Weight:      Height:        Intake/Output Summary (Last 24 hours) at 06/03/2020 1837 Last data filed at 06/03/2020 5809 Gross per 24 hour  Intake 360 ml  Output 575 ml  Net -215 ml   Filed Weights   05/31/20 1659 06/02/20 1721  Weight: (!) 140.6 kg (!) 147 kg    Exam:  . General: 64 y.o. year-old female pleasant morbidly obese in no acute distress.  Alert and oriented x3.   . Cardiovascular: Regular rate and rhythm no rubs or gallops. Marland Kitchen Respiratory: Clear to auscultation no wheezes or rales. . Abdomen: Obese nontender normal bowel sounds present. . Musculoskeletal: Bilateral lower extremity edema up to abdomen.   . Skin: Left chest burn from radiation.   Marland Kitchen Psychiatry: Mood is appropriate for condition and setting.  Data Reviewed: CBC: Recent Labs  Lab 05/31/20 1534 06/01/20 0423 06/03/20 0100  WBC 3.8* 2.9* 2.9*  NEUTROABS 2.5 1.8  --   HGB 7.9* 7.5* 7.7*  HCT 21.8* 20.7* 20.9*  MCV 93.6 95.0 93.3  PLT 134* 136* 983*   Basic Metabolic Panel: Recent Labs  Lab 05/31/20 1923 06/01/20 0153 06/01/20 0423 06/01/20 0700 06/01/20 1411 06/03/20 0100 06/03/20 1506  NA 123*   < > 120* 119* 121* 125* 123*  K 3.6   < > 3.3* 3.2* 3.5 3.8 3.6  CL 89*   < > 89* 88* 88* 91* 90*  CO2 15*   < > 16* 16* 17* 18* 20*  GLUCOSE 67*   < > 80 81 94 146* 172*  BUN 48*   < > 52* 53* 53* 57* 60*  CREATININE 4.32*   < > 4.29* 4.15* 4.19* 3.76* 3.62*  CALCIUM 8.4*   < > 8.1* 8.1* 8.4* 8.9 8.8*  MG 1.6*  --  1.9  --   --   --   --   PHOS 6.2*  --  5.8*  --   --   --   --    < > = values in this interval not displayed.   GFR: Estimated Creatinine Clearance: 21.7 mL/min (A) (by C-G formula based on SCr of 3.62 mg/dL (H)). Liver Function Tests: Recent Labs  Lab  05/31/20 1534 06/01/20 0423  AST 48* 40  ALT 28 24  ALKPHOS 94 87  BILITOT 0.9 1.3*  PROT 6.9 6.6  ALBUMIN 2.7* 3.0*   No results for input(s): LIPASE, AMYLASE in the last 168 hours. Recent Labs  Lab 05/31/20 1923  AMMONIA 39*   Coagulation Profile: Recent Labs  Lab 05/31/20 1534  INR 1.2   Cardiac Enzymes: No results for input(s): CKTOTAL, CKMB, CKMBINDEX, TROPONINI in the last 168 hours. BNP (last 3 results) No results for input(s):  PROBNP in the last 8760 hours. HbA1C: No results for input(s): HGBA1C in the last 72 hours. CBG: Recent Labs  Lab 05/31/20 2042 05/31/20 2245 06/01/20 0150  GLUCAP 68* 77 78   Lipid Profile: No results for input(s): CHOL, HDL, LDLCALC, TRIG, CHOLHDL, LDLDIRECT in the last 72 hours. Thyroid Function Tests: Recent Labs    06/01/20 0423 06/01/20 0800  TSH 4.685*  --   FREET4  --  1.13*   Anemia Panel: Recent Labs    06/01/20 0423  VITAMINB12 561  FOLATE 9.5  FERRITIN 951*  TIBC 133*  IRON 130  RETICCTPCT 1.0   Urine analysis:    Component Value Date/Time   COLORURINE YELLOW 05/31/2020 1923   APPEARANCEUR CLEAR 05/31/2020 1923   LABSPEC 1.012 05/31/2020 1923   PHURINE 5.0 05/31/2020 1923   GLUCOSEU NEGATIVE 05/31/2020 1923   HGBUR NEGATIVE 05/31/2020 1923   BILIRUBINUR NEGATIVE 05/31/2020 1923   KETONESUR NEGATIVE 05/31/2020 1923   PROTEINUR 100 (A) 05/31/2020 1923   UROBILINOGEN 1.0 07/14/2012 1630   NITRITE NEGATIVE 05/31/2020 1923   LEUKOCYTESUR NEGATIVE 05/31/2020 1923   Sepsis Labs: @LABRCNTIP (procalcitonin:4,lacticidven:4)  ) Recent Results (from the past 240 hour(s))  Respiratory Panel by RT PCR (Flu A&B, Covid) - Nasopharyngeal Swab     Status: None   Collection Time: 05/31/20  7:23 PM   Specimen: Nasopharyngeal Swab  Result Value Ref Range Status   SARS Coronavirus 2 by RT PCR NEGATIVE NEGATIVE Final    Comment: (NOTE) SARS-CoV-2 target nucleic acids are NOT DETECTED.  The SARS-CoV-2 RNA is  generally detectable in upper respiratoy specimens during the acute phase of infection. The lowest concentration of SARS-CoV-2 viral copies this assay can detect is 131 copies/mL. A negative result does not preclude SARS-Cov-2 infection and should not be used as the sole basis for treatment or other patient management decisions. A negative result may occur with  improper specimen collection/handling, submission of specimen other than nasopharyngeal swab, presence of viral mutation(s) within the areas targeted by this assay, and inadequate number of viral copies (<131 copies/mL). A negative result must be combined with clinical observations, patient history, and epidemiological information. The expected result is Negative.  Fact Sheet for Patients:  PinkCheek.be  Fact Sheet for Healthcare Providers:  GravelBags.it  This test is no t yet approved or cleared by the [place] FDA and  has been authorized for detection and/or diagnosis of SARS-CoV-2 by FDA under an Emergency Use Authorization (EUA). This EUA will remain  in effect (meaning this test can be used) for the duration of the COVID-19 declaration under Section 564(b)(1) of the Act, 21 U.S.C. section 360bbb-3(b)(1), unless the authorization is terminated or revoked sooner.     Influenza A by PCR NEGATIVE NEGATIVE Final   Influenza B by PCR NEGATIVE NEGATIVE Final    Comment: (NOTE) The Xpert Xpress SARS-CoV-2/FLU/RSV assay is intended as an aid in  the diagnosis of influenza from Nasopharyngeal swab specimens and  should not be used as a sole basis for treatment. Nasal washings and  aspirates are unacceptable for Xpert Xpress SARS-CoV-2/FLU/RSV  testing.  Fact Sheet for Patients: PinkCheek.be  Fact Sheet for Healthcare Providers: GravelBags.it  This test is not yet approved or cleared by the [place] FDA and  has been authorized for detection and/or diagnosis of SARS-CoV-2 by  FDA under an Emergency Use Authorization (EUA). This EUA will remain  in effect (meaning this test can be used) for the duration of the  Covid-19 declaration under  Section 564(b)(1) of the Act, 21  U.S.C. section 360bbb-3(b)(1), unless the authorization is  terminated or revoked. Performed at [facility], [place] [address], [place],  [address]       Studies: No results found.  Scheduled Meds: . anastrozole  1 mg Oral Daily  . carvedilol  12.5 mg Oral BID WC  . Chlorhexidine Gluconate Cloth  6 each Topical Daily  . dexamethasone  4 mg Oral Daily  . docusate sodium  100 mg Oral BID  . feeding supplement (ENSURE ENLIVE)  237 mL Oral BID BM  . feeding supplement (KATE FARMS STANDARD 1.4)  325 mL Oral BID BM  . folic acid  1 mg Oral Daily  . gabapentin  100 mg Oral BID  . heparin  5,000 Units Subcutaneous Q8H  . mineral oil-hydrophilic petrolatum   Topical BID  . multivitamin with minerals  1 tablet Oral Daily  . nystatin   Topical TID  . sodium chloride flush  3 mL Intravenous Q12H  . sodium chloride flush  3 mL Intravenous Q12H  . thiamine  100 mg Oral Daily   Or  . thiamine  100 mg Intravenous Daily    Continuous Infusions: . sodium chloride    . furosemide       LOS: 3 days     Kayleen Memos, MD [facility] Pager [phone]  If 7PM-7AM, please contact night-coverage [email or web address] Password [facility] 06/03/2020, 6:37 PM

## 2020-06-03 NOTE — Progress Notes (Signed)
[place]  [phone] Fax:([phone]     ID: [name] DOB: 1956-01-27  MR#: [id number]  PTW#:[id number]  Patient Care Team: [name], MD as PCP - General (Family Medicine) Mauro Kaufmann, RN as Oncology Nurse Navigator Rockwell Germany, RN as Oncology Nurse Navigator [name], MD as Consulting Physician (General Surgery) Zairah Arista, Sue Dad, MD as Consulting Physician (Oncology) Kyung Rudd, MD as Consulting Physician (Radiation Oncology) Edrick Kins, DPM as Consulting Physician (Podiatry) Chauncey Cruel, MD OTHER MD:   CHIEF COMPLAINT: estrogen receptor positive breast cancer  CURRENT TREATMENT: anastrozole; radiation therapy   INTERVAL HISTORY: [name] was readmitted yesterday with "swelling" and acute on chronic kidney injury. From a heme-onc perspective she is on anastrozole for breast cancer. She is also cytopenic.  REVIEW OF SYSTEMS: This morning she tells me she is feeling better. Legs are less swollen. Tolerating foley. Not getting OOB "yet." Discussed diet and alcohol consumption. No family in room   HISTORY OF CURRENT ILLNESS: From the original intake note:  [name] (pronounced "yellow") had routine screening mammography on 10/24/2019 showing a possible abnormality in the left breast. She underwent left breast ultrasonography at [facility] on 11/05/2019 showing: suspicious 3.8 cm mass in left breast at 3 o'clock; five abnormal lymph nodes.  Accordingly on 11/12/2019 she proceeded to biopsy of the left breast area in question as well as a lymph node. The pathology from this procedure ([id number]) showed: invasive ductal carcinoma with prominent inflammatory response, grade 3. Prognostic indicators significant for: estrogen receptor, 100% positive and progesterone receptor, 50% positive, both with strong staining intensity. Proliferation marker Ki67 at 70%. HER2 negative by immunohistochemistry (0).  The  biopsied lymph node confirmed metastatic breast carcinoma.  The patient's subsequent history is as detailed below.   PAST MEDICAL HISTORY: Past Medical History:  Diagnosis Date  . Arthritis   . Cancer ([place]) 11/2019   Left breast  . Diverticulitis   . Family history of ovarian cancer   . Heart murmur    11/26/19 echo: Mild AS. AV mean gradient 12.0 mmHg; however, LVOT gradient 8 mmHg with intracvitary gradient-significant AS is not suspected  . Hypertension     PAST SURGICAL HISTORY: Past Surgical History:  Procedure Laterality Date  . MASTECTOMY WITH AXILLARY LYMPH NODE DISSECTION Left 12/19/2019   Procedure: LEFT MASTECTOMY WITH AXILLARY LYMPH NODE DISSECTION;  Surgeon: [name], MD;  Location: [facility];  Service: General;  Laterality: Left;  Marland Kitchen MULTIPLE EXTRACTIONS WITH ALVEOLOPLASTY Bilateral 12/14/2019   Procedure: MULTIPLE EXTRACTION WITH ALVEOLOPLASTY;  Surgeon: [name], DDS;  Location: [facility];  Service: Oral Surgery;  Laterality: Bilateral;  . PORTACATH PLACEMENT N/A 12/19/2019   Procedure: INSERTION PORT-A-CATH WITH ULTRASOUND GUIDANCE;  Surgeon: [name], MD;  Location: [facility];  Service: General;  Laterality: N/A;  . RADIAL HEAD ARTHROPLASTY Right 06/25/2019   Procedure: RIGHT RADIAL HEAD ARTHROPLASTY;  Surgeon: [name], MD;  Location: [facility];  Service: Orthopedics;  Laterality: Right;  . TUBAL LIGATION      FAMILY HISTORY: Family History  Problem Relation Age of Onset  . Hypertension Mother   . Dementia Mother   . Ovarian cancer Half-Sister 56  . Diabetes Half-Sister   . Stroke Half-Sister   Her father died at age 75; she is unsure of the cause of death. Her mother is age 38 as of 10/2019.  The patient has two brothers and four sisters. She reports ovarian cancer in her sister at age  61. She denies a family history of breast, prostate, or pancreatic cancer.   GYNECOLOGIC HISTORY:  No LMP recorded. Patient is postmenopausal. Menarche: 64 years  old Age at first live birth: 64 years old [place] 2 LMP around age 28 Contraceptive: used previously HRT never used  Hysterectomy? no BSO? no   SOCIAL HISTORY: (updated 10/2019)  [name] retired from working at the [employer]. She is divorced. She lives at home alone but during the pandemic her sister [name] and mother [name] lived in the same home. [name], age 32, lives in [place] and is handicapped. [name], age 96, is a Biomedical scientist here in [place]. [name] has two granddaughters. She attends a local [employer].    ADVANCED DIRECTIVES: not in place; at the 11/21/2019 visit the patient was given the appropriate documents to complete and notarized at her discretion.  She expressed spontaneously that she would not want to be resuscitated in case of a terminal event.    HEALTH MAINTENANCE: Social History   Tobacco Use  . Smoking status: Never Smoker  . Smokeless tobacco: Never Used  Vaping Use  . Vaping Use: Never used  Substance Use Topics  . Alcohol use: Yes    Alcohol/week: 3.0 standard drinks    Types: 1 Cans of beer, 2 Shots of liquor per week    Comment: daily  . Drug use: Yes    Types: Marijuana     Colonoscopy: never done  PAP: "1980?"  Bone density: never done   Allergies  Allergen Reactions  . Amlodipine Swelling    BLE edema    Current Facility-Administered Medications  Medication Dose Route Frequency Provider Last Rate Last Admin  . 0.9 %  sodium chloride infusion  250 mL Intravenous PRN Doutova, Nyoka Lint, MD      . acetaminophen (TYLENOL) tablet 650 mg  650 mg Oral Q6H PRN Doutova, Anastassia, MD       Or  . acetaminophen (TYLENOL) suppository 650 mg  650 mg Rectal Q6H PRN Doutova, Anastassia, MD      . anastrozole (ARIMIDEX) tablet 1 mg  1 mg Oral Daily Doutova, Anastassia, MD   1 mg at 06/02/20 0909  . carvedilol (COREG) tablet 12.5 mg  12.5 mg Oral BID WC Doutova, Anastassia, MD   12.5 mg at 06/02/20 1725  . Chlorhexidine Gluconate Cloth 2  % PADS 6 each  6 each Topical Daily Kayleen Memos, DO   6 each at 06/02/20 1836  . dexamethasone (DECADRON) tablet 4 mg  4 mg Oral Daily Doutova, Anastassia, MD   4 mg at 06/02/20 0900  . docusate sodium (COLACE) capsule 100 mg  100 mg Oral BID Doutova, Anastassia, MD   100 mg at 06/02/20 0900  . folic acid (FOLVITE) tablet 1 mg  1 mg Oral Daily Doutova, Anastassia, MD   1 mg at 06/02/20 0900  . furosemide (LASIX) 100 mg in dextrose 5 % 50 mL IVPB  100 mg Intravenous Q8H Roney Jaffe, MD 60 mL/hr at 06/03/20 0201 100 mg at 06/03/20 0201  . gabapentin (NEURONTIN) capsule 100 mg  100 mg Oral BID Irene Pap N, DO   100 mg at 06/02/20 2122  . heparin injection 5,000 Units  5,000 Units Subcutaneous Q8H Toy Baker, MD   5,000 Units at 06/03/20 0540  . HYDROcodone-acetaminophen (NORCO/VICODIN) 5-325 MG per tablet 1-2 tablet  1-2 tablet Oral Q4H PRN Toy Baker, MD   1 tablet at 06/02/20 2145  . LORazepam (ATIVAN) tablet  1-4 mg  1-4 mg Oral Q1H PRN Toy Baker, MD       Or  . LORazepam (ATIVAN) injection 1-4 mg  1-4 mg Intravenous Q1H PRN Doutova, Anastassia, MD      . mineral oil-hydrophilic petrolatum (AQUAPHOR) ointment   Topical BID Kayleen Memos, DO   Given at 06/02/20 2202  . multivitamin with minerals tablet 1 tablet  1 tablet Oral Daily Doutova, Anastassia, MD   1 tablet at 06/02/20 0900  . nystatin (MYCOSTATIN/NYSTOP) topical powder   Topical TID Toy Baker, MD   Given at 06/02/20 2123  . ondansetron (ZOFRAN) tablet 4 mg  4 mg Oral Q6H PRN Toy Baker, MD       Or  . ondansetron (ZOFRAN) injection 4 mg  4 mg Intravenous Q6H PRN Doutova, Anastassia, MD      . polyethylene glycol (MIRALAX / GLYCOLAX) packet 17 g  17 g Oral Daily PRN Doutova, Anastassia, MD      . sodium chloride flush (NS) 0.9 % injection 3 mL  3 mL Intravenous Q12H Doutova, Anastassia, MD   3 mL at 06/01/20 2251  . sodium chloride flush (NS) 0.9 % injection 3 mL  3 mL Intravenous  PRN Doutova, Anastassia, MD      . sodium chloride flush (NS) 0.9 % injection 3 mL  3 mL Intravenous Q12H Doutova, Anastassia, MD   3 mL at 06/01/20 2251  . thiamine tablet 100 mg  100 mg Oral Daily Doutova, Anastassia, MD   100 mg at 06/02/20 0900   Or  . thiamine (B-1) injection 100 mg  100 mg Intravenous Daily Doutova, Nyoka Lint, MD        OBJECTIVE: African-American woman examined in bed  Vitals:   06/03/20 0127 06/03/20 0535  BP: 106/67 115/69  Pulse: 72 79  Resp: 20 18  Temp: 98.8 F (37.1 C) 98.1 F (36.7 C)  SpO2: 100% 100%     Body mass index is 61.23 kg/m.   Wt Readings from Last 3 Encounters:  06/02/20 (!) 324 lb 1.2 oz (147 kg)  05/14/20 (!) 305 lb 4.8 oz (138.5 kg)  04/28/20 300 lb (136.1 kg)    LAB RESULTS:  CMP     Component Value Date/Time   NA 125 (L) 06/03/2020 0100   NA 135 01/02/2020 1710   K 3.8 06/03/2020 0100   CL 91 (L) 06/03/2020 0100   CO2 18 (L) 06/03/2020 0100   GLUCOSE 146 (H) 06/03/2020 0100   BUN 57 (H) 06/03/2020 0100   BUN 25 01/02/2020 1710   CREATININE 3.76 (H) 06/03/2020 0100   CREATININE 1.59 (H) 11/21/2019 0908   CALCIUM 8.9 06/03/2020 0100   PROT 6.6 06/01/2020 0423   PROT 8.5 06/15/2019 1622   ALBUMIN 3.0 (L) 06/01/2020 0423   ALBUMIN 4.3 06/15/2019 1622   AST 40 06/01/2020 0423   AST 61 (H) 11/21/2019 0908   ALT 24 06/01/2020 0423   ALT 32 11/21/2019 0908   ALKPHOS 87 06/01/2020 0423   BILITOT 1.3 (H) 06/01/2020 0423   BILITOT 0.5 11/21/2019 0908   GFRNONAA 12 (L) 06/03/2020 0100   GFRNONAA 34 (L) 11/21/2019 0908   GFRAA 14 (L) 06/03/2020 0100   GFRAA 39 (L) 11/21/2019 0908    No results found for: TOTALPROTELP, ALBUMINELP, A1GS, A2GS, BETS, BETA2SER, GAMS, MSPIKE, SPEI  Lab Results  Component Value Date   WBC 2.9 (L) 06/03/2020   NEUTROABS 1.8 06/01/2020   HGB 7.7 (L) 06/03/2020   HCT 20.9 (L)  06/03/2020   MCV 93.3 06/03/2020   PLT 128 (L) 06/03/2020    No results found for: LABCA2  No components found  for: LEXNTZ001  Recent Labs  Lab 05/31/20 1534  INR 1.2    No results found for: LABCA2  No results found for: VCB449  No results found for: CAN125  No results found for: QPR916  Lab Results  Component Value Date   CA2729 10.6 05/15/2020    No components found for: HGQUANT  No results found for: CEA1 / No results found for: CEA1   No results found for: AFPTUMOR  No results found for: CHROMOGRNA  No results found for: KPAFRELGTCHN, LAMBDASER, KAPLAMBRATIO (kappa/lambda light chains)  No results found for: HGBA, HGBA2QUANT, HGBFQUANT, HGBSQUAN (Hemoglobinopathy evaluation)   Lab Results  Component Value Date   LDH 294 (H) 05/15/2020    Lab Results  Component Value Date   IRON 130 06/01/2020   TIBC 133 (L) 06/01/2020   IRONPCTSAT 97 (H) 06/01/2020   (Iron and TIBC)  Lab Results  Component Value Date   FERRITIN 951 (H) 06/01/2020    Urinalysis    Component Value Date/Time   COLORURINE YELLOW 05/31/2020 1923   APPEARANCEUR CLEAR 05/31/2020 1923   LABSPEC 1.012 05/31/2020 1923   PHURINE 5.0 05/31/2020 1923   GLUCOSEU NEGATIVE 05/31/2020 1923   HGBUR NEGATIVE 05/31/2020 1923   BILIRUBINUR NEGATIVE 05/31/2020 [place] 05/31/2020 1923   PROTEINUR 100 (A) 05/31/2020 1923   UROBILINOGEN 1.0 07/14/2012 1630   NITRITE NEGATIVE 05/31/2020 1923   LEUKOCYTESUR NEGATIVE 05/31/2020 1923     STUDIES: US RENAL  Result Date: 05/31/2020 CLINICAL DATA:  Acute kidney injury. EXAM: RENAL / URINARY TRACT ULTRASOUND COMPLETE COMPARISON:  CT abdomen pelvis 03/07/2020 FINDINGS: Right Kidney: Renal measurements: 13.3 x 5.3 x 6.2 cm = volume: 226 mL. Echogenicity within normal limits. No mass or hydronephrosis visualized. Left Kidney: Renal measurements: 11.3 x 5.8 x 4.9 cm = volume: 185 mL. Poor visualization secondary to patient body habitus. No definite hydronephrosis, renal calculus or renal mass. Bladder: Not visualized. Other: None. IMPRESSION:  Limited evaluation particularly of the left kidney due to patient body habitus. No definite acute finding. Electronically Signed   By: Audie Pinto M.D.   On: 05/31/2020 19:52   DG Chest Port 1 View  Result Date: 05/31/2020 CLINICAL DATA:  Left arm and hand swelling for several days. EXAM: PORTABLE CHEST 1 VIEW COMPARISON:  04/29/2020 FINDINGS: There is a stable right-sided subclavian Port-A-Cath. The heart size remains enlarged but stable from prior study. Aortic calcifications are noted. There is probable mild vascular congestion without overt pulmonary edema. There is no pneumothorax. Surgical clips are noted in the patient's left axilla which may related to prior lymph node dissection. IMPRESSION: Cardiomegaly with mild pulmonary vascular congestion. No overt pulmonary edema. Electronically Signed   By: Constance Holster M.D.   On: 05/31/2020 19:27   UE VENOUS DUPLEX ([facility] 7 am - 7 pm)  Result Date: 06/01/2020 UPPER VENOUS STUDY  Indications: Edema Limitations: Bandages, line, poor ultrasound/tissue interface, body habitus and edema, patient's somnolence. Comparison Study: No prior study Performing Technologist: Sharion Dove RVS  Examination Guidelines: A complete evaluation includes B-mode imaging, spectral Doppler, color Doppler, and power Doppler as needed of all accessible portions of each vessel. Bilateral testing is considered an integral part of a complete examination. Limited examinations for reoccurring indications may be performed as noted.  Left Findings: +----------+------------+---------+-----------+----------+-------+ LEFT  CompressiblePhasicitySpontaneousPropertiesSummary +----------+------------+---------+-----------+----------+-------+ IJV                      Yes       Yes                      +----------+------------+---------+-----------+----------+-------+ Subclavian               Yes       Yes                       +----------+------------+---------+-----------+----------+-------+ Axillary                 Yes       Yes                      +----------+------------+---------+-----------+----------+-------+ Brachial      Full       Yes       Yes                      +----------+------------+---------+-----------+----------+-------+ Cephalic      Full                                          +----------+------------+---------+-----------+----------+-------+ Basilic                  Yes       Yes                      +----------+------------+---------+-----------+----------+-------+  Summary:  Left: No obvious evidence of deep vein thrombosis in the upper extremity. No obvious evidence of superficial vein thrombosis in the upper extremity.  *See table(s) above for measurements and observations.  Diagnosing physician: Harold Barban MD Electronically signed by Harold Barban MD on 06/01/2020 at 7:43:29 PM.    Final      ELIGIBLE FOR AVAILABLE RESEARCH PROTOCOL: AET  ASSESSMENT: 64 y.o.  woman status post left breast upper outer quadrant and left axillary lymph node biopsies 11/12/2019 for a clinical T2 N2, stage IIIA invasive ductal carcinoma, grade 3, estrogen and progesterone receptor positive, HER-2 not amplified, with an MIB-1 of 70%  (a) the biopsied axillary lymph node was positive  (1) genetics testing 11/29/2019 through the Breast Cancer STAT Panel offered by [facility] found no deleterious mutations in ATM, BRCA1, BRCA2, CDH1, CHEK2, PALB2, PTEN, STK11 and TP53.  Additional testing through the Common Hereditary Cancers Panel confirmed no deleterious mutations in APC, ATM, AXIN2, BARD1, BMPR1A, BRCA1, BRCA2, BRIP1, CDH1, CDK4, CDKN2A (p14ARF), CDKN2A (p16INK4a), CHEK2, CTNNA1, DICER1, EPCAM (Deletion/duplication testing only), GREM1 (promoter region deletion/duplication testing only), KIT, MEN1, MLH1, MSH2, MSH3, MSH6, MUTYH, NBN, NF1, NHTL1, PALB2, PDGFRA, PMS2, POLD1, POLE, PTEN,  RAD50, RAD51C, RAD51D, RNF43, SDHB, SDHC, SDHD, SMAD4, SMARCA4. STK11, TP53, TSC1, TSC2, and VHL.  The following genes were evaluated for sequence changes only: SDHA and HOXB13 c.251G>A variant only.  (2) status post left modified radical mastectomy 12/19/2019 for a pT2 pN1, stage IIB invasive ductal carcinoma, grade 3, with negative margins.  (a) 1 of 13 axillary lymph nodes removed was positive  (3) adjuvant chemotherapy will consist of cyclophosphamide and doxorubicin in dose dense fashion x4 starting 01/22/2020 followed by weekly paclitaxel x12  (a) baseline echocardiogram 11/26/2019 shows an ejection fraction in the 65-70% range.  (b) first cycle of cyclophosphamide  and doxorubicin dose reduced to assess tolerance  (c) chemotherapy discontinued after 2 cycles because of poor tolerance  (4) adjuvant radiation ongoing, to be completed 05/21/2020  (5) anastrozole started July 2021   PLAN: Sherre continues on anastrozole for her locally advanced breast cancer, s/p resection, and adjuvant chemotherapy and radiotherapy. At present there is no evidence of disease activity. She is tolerating anastrozole well. This drug has no relation to her current active problems-- continue anastrozole as you are doing.  [name]''s blood counts are moderately low. Her folate, B-12 and folate are adequate. Her reticulocytes are borderline low. The most likely reason for this is alcohol suppression of marrow function-- the patient tells me she usually has about 2 doses of vodka daily. We discussed this and she plans to discontinue alcohol or at a minimum cut back to no more than one drink a day. I anticipate her counts will drift up over the next 7-14 days. Would not pursue bone marrow biopsy--this requires only follow-up  She is scheduled to see me at the [facility] 07/09/2020 at noon. I will review her counts at that time.  Please let me know if I can be of further help at this point.    [name]. Maginat,  MD 06/03/20 7:59 AM Medical Oncology and Hematology [facility] [place], [place] [address] Tel. [phone]    Fax. [phone]   I, Wilburn Mylar, am acting as scribe for Dr. Virgie [name]. [name].  I, Lurline Del MD, have reviewed the above documentation for accuracy and completeness, and I agree with the above.   *Total Encounter Time as defined by the Centers for Medicare and Medicaid Services includes, in addition to the face-to-face time of a patient visit (documented in the note above) non-face-to-face time: obtaining and reviewing outside history, ordering and reviewing medications, tests or procedures, care coordination (communications with other health care professionals or caregivers) and documentation in the medical record.

## 2020-06-04 ENCOUNTER — Inpatient Hospital Stay (HOSPITAL_COMMUNITY): Payer: Medicaid Other

## 2020-06-04 DIAGNOSIS — I350 Nonrheumatic aortic (valve) stenosis: Secondary | ICD-10-CM | POA: Diagnosis not present

## 2020-06-04 DIAGNOSIS — N179 Acute kidney failure, unspecified: Secondary | ICD-10-CM | POA: Diagnosis not present

## 2020-06-04 DIAGNOSIS — I361 Nonrheumatic tricuspid (valve) insufficiency: Secondary | ICD-10-CM

## 2020-06-04 DIAGNOSIS — R601 Generalized edema: Secondary | ICD-10-CM

## 2020-06-04 DIAGNOSIS — F1020 Alcohol dependence, uncomplicated: Secondary | ICD-10-CM | POA: Diagnosis not present

## 2020-06-04 LAB — ECHOCARDIOGRAM COMPLETE
AR max vel: 1.53 cm2
AV Area VTI: 1.31 cm2
AV Area mean vel: 1.48 cm2
AV Mean grad: 10.5 mmHg
AV Peak grad: 22.3 mmHg
Ao pk vel: 2.36 m/s
Area-P 1/2: 2.76 cm2
Calc EF: 72.6 %
Height: 61 in
S' Lateral: 2.1 cm
Single Plane A2C EF: 71.4 %
Single Plane A4C EF: 73 %
Weight: 5185.22 oz

## 2020-06-04 LAB — BASIC METABOLIC PANEL
Anion gap: 12 (ref 5–15)
BUN: 63 mg/dL — ABNORMAL HIGH (ref 8–23)
CO2: 20 mmol/L — ABNORMAL LOW (ref 22–32)
Calcium: 8.6 mg/dL — ABNORMAL LOW (ref 8.9–10.3)
Chloride: 91 mmol/L — ABNORMAL LOW (ref 98–111)
Creatinine, Ser: 3.48 mg/dL — ABNORMAL HIGH (ref 0.44–1.00)
GFR calc non Af Amer: 13 mL/min — ABNORMAL LOW (ref >60)
Glucose, Bld: 132 mg/dL — ABNORMAL HIGH (ref 70–99)
Potassium: 3.6 mmol/L (ref 3.5–5.1)
Sodium: 123 mmol/L — ABNORMAL LOW (ref 135–145)

## 2020-06-04 LAB — CBC
HCT: 20.4 % — ABNORMAL LOW (ref 36.0–46.0)
Hemoglobin: 7.4 g/dL — ABNORMAL LOW (ref 12.0–15.0)
MCH: 33.9 pg (ref 26.0–34.0)
MCHC: 36.3 g/dL — ABNORMAL HIGH (ref 30.0–36.0)
MCV: 93.6 fL (ref 80.0–100.0)
Platelets: 134 10*3/uL — ABNORMAL LOW (ref 150–400)
RBC: 2.18 MIL/uL — ABNORMAL LOW (ref 3.87–5.11)
RDW: 14.5 % (ref 11.5–15.5)
WBC: 5.3 10*3/uL (ref 4.0–10.5)
nRBC: 0.6 % — ABNORMAL HIGH (ref 0.0–0.2)

## 2020-06-04 MED ORDER — POTASSIUM CHLORIDE CRYS ER 20 MEQ PO TBCR
40.0000 meq | EXTENDED_RELEASE_TABLET | Freq: Once | ORAL | Status: AC
  Administered 2020-06-04: 40 meq via ORAL
  Filled 2020-06-04: qty 2

## 2020-06-04 NOTE — Evaluation (Addendum)
Physical Therapy Evaluation Patient Details Name: [name] MRN: [id number] DOB: Sep 02, 1955 Today's Date: 06/04/2020   History of Present Illness  63 y.o. female with medical history significant of breast CA sp Mastectomy and radiation on the left, morbid obesity, HTN, multiple skin ulcers, Alcohol liver diseaseCKD stage IIIa, Grade 1 diastolic CHF.  Presented with bilateral leg swelling and burning. Most recent radiation session was 3 days PTA. Dx of hyponatremia  Clinical Impression  Pt admitted with above diagnosis. MOd assist for supine to sit, min A to stand from elevated bed, min A to pivot to recliner. Pt ambulates short distances with RW at baseline and has 24* assistance from family at home.  Pt currently with functional limitations due to the deficits listed below (see PT Problem List). Pt will benefit from skilled PT to increase their independence and safety with mobility to allow discharge to the venue listed below.       Follow Up Recommendations Supervision for mobility/OOB;Supervision/Assistance - 24 hour;Home health PT    Equipment Recommendations  None recommended by PT    Recommendations for Other Services       Precautions / Restrictions Precautions Precautions: Fall Precaution Comments: fell last week, denies other falls in past 1 year Restrictions Weight Bearing Restrictions: No      Mobility  Bed Mobility Overal bed mobility: Needs Assistance Bed Mobility: Supine to Sit     Supine to sit: Mod assist     General bed mobility comments: assist to raise trunk, used pad to pivot hips to edge of bed  Transfers Overall transfer level: Needs assistance Equipment used: Rolling walker (2 wheeled) Transfers: Sit to/from [facility] Sit to Stand: Min assist;From elevated surface Stand pivot transfers: Min assist       General transfer comment: min A to rise from elevated bed, min A for balance 2* posterior lean during  SPT  Ambulation/Gait                Stairs            Wheelchair Mobility    Modified Rankin (Stroke Patients Only)       Balance Overall balance assessment: Needs assistance   Sitting balance-Leahy Scale: Good     Standing balance support: Bilateral upper extremity supported Standing balance-Leahy Scale: Poor Standing balance comment: relies on BUE support                             Pertinent Vitals/Pain Pain Assessment: No/denies pain    Home Living Family/patient expects to be discharged to:: Private residence Living Arrangements: Other relatives Available Help at Discharge: Family;Personal care attendant;Available 24 hours/day Type of Home: Apartment Home Access: Stairs to enter Entrance Stairs-Rails: Right;Left;Can reach both Entrance Stairs-Number of Steps: 2 Home Layout: One level Home Equipment: Grab bars - toilet;Grab bars - tub/shower;Wheelchair - [employer] - 2 wheels;Cane - single point;Shower seat Additional Comments: aide comes  3 hours/day x 7 days/week, pt lives with granddaughter    Prior Function Level of Independence: Needs assistance   Gait / Transfers Assistance Needed: reports ambulating with rolling walker at home     Comments: walks with RW, aide assists with sponge bathing, son assists with stairs, pt stated that she is never alone at home, 4 granddaughters and her son take turns being with her     Hand Dominance        Extremity/Trunk Assessment   Upper Extremity Assessment Upper Extremity  Assessment: Overall WFL for tasks assessed    Lower Extremity Assessment Lower Extremity Assessment: Overall WFL for tasks assessed (B knee ext 4/5)    Cervical / Trunk Assessment Cervical / Trunk Assessment: Normal  Communication   Communication: No difficulties  Cognition Arousal/Alertness: Awake/alert Behavior During Therapy: WFL for tasks assessed/performed Overall Cognitive Status: Within Functional Limits  for tasks assessed                                        General Comments      Exercises  ankle pumps x 10 Both supine AROM   Assessment/Plan    PT Assessment Patient needs continued PT services  PT Problem List Decreased activity tolerance;Decreased balance;Decreased mobility       PT Treatment Interventions      PT Goals (Current goals can be found in the Care Plan section)  Acute Rehab PT Goals Patient Stated Goal: likes to sew and watch tv PT Goal Formulation: With patient Time For Goal Achievement: 06/18/20 Potential to Achieve Goals: Good    Frequency Min 3X/week   Barriers to discharge        Co-evaluation               AM-PAC PT "6 Clicks" Mobility  Outcome Measure Help needed turning from your back to your side while in a flat bed without using bedrails?: A Lot Help needed moving from lying on your back to sitting on the side of a flat bed without using bedrails?: A Lot Help needed moving to and from a bed to a chair (including a wheelchair)?: A Little Help needed standing up from a chair using your arms (e.g., wheelchair or bedside chair)?: A Lot Help needed to walk in hospital room?: A Lot Help needed climbing 3-5 steps with a railing? : A Lot 6 Click Score: 13    End of Session Equipment Utilized During Treatment: Gait belt Activity Tolerance: Patient tolerated treatment well Patient left: in chair;with call bell/phone within reach;with chair alarm set Nurse Communication: Mobility status PT Visit Diagnosis: Difficulty in walking, not elsewhere classified (R26.2);History of falling (Z91.81)    Time: 1236-1300 PT Time Calculation (min) (ACUTE ONLY): 24 min   Charges:   PT Evaluation $PT Eval Low Complexity: 1 Low PT Treatments $Therapeutic Activity: 8-22 mins        Blondell Reveal Kistler PT 06/04/2020  Acute Rehabilitation Services Pager [phone] Office [phone]

## 2020-06-04 NOTE — Progress Notes (Signed)
PROGRESS NOTE    [name]  OBS:[id number] DOB: Oct 23, 1955 DOA: 05/31/2020 PCP: [name], MD    Brief Narrative:  64 y.o.femalewith medical history significant of breast CA sp Mastectomy and radiation on the left,morbid obesity, HTN, multiple skin ulcers, Alcohol liver disease CKD stage IIIa, Grade 1 diastolic CHF.  Presented withbilateral leg swelling and burning on the bottom.  She has been urinating a bit more than usual.  Reports she drinks 4 bottle of water a day and ensure.  She has no appetite and has not been eating.  She gained fluid weight.  Reports her ulcers have been healing.  She can walk a bit uses wheelchair.  She still drinks few mixed drinks a day 2-3/day no hx of withdraw.  Last left breast radiation therapy was 3 days ago  Reports feet are burning.  Hurts to stand up.  No diarrhea no melena no blood in stool.  Work up revealed No evidence of DVT, Hyponatremia and acute on chronic CKD3B.  TRH was asked to admit.  Nephrology was consulted and is following.  Assessment & Plan:   Active Problems:   Essential hypertension   Alcohol dependence ([place])   Edema   Malignant neoplasm of upper-outer quadrant of left breast in female, estrogen receptor positive ([place])   Morbid obesity with BMI of 60.0-69.9, adult (HCC)   Acute renal failure (ARF) (HCC)   Thrombocytopenia (HCC)   Debility   Anemia of chronic disease   Chronic kidney disease, stage 3a (HCC)   Hyponatremia   Pancytopenia (HCC)   Breast cancer (HCC)  Hypervolemic hyponatremia Reports drinking 4 bottles of water and Ensure daily Presented with serum sodium 119, low serum osmolality, hypervolemic on exam Held V fluids as recommended by nephrology Continue IV lasix per Nephrology Discussed with Nephrology, would hold bp meds to allow good diuresis Trend serum Na+  Improving, nonoliguric AKI on CKD 3B Baseline creatinine appears to be 2.7 Presented with creatinine of greater than 4 with GFR of  12 Creatinine downtrending down to 3.48 today Avoid nephrotoxins and hypotension Monitor urine output  Management per nephrology Recheck bmet in AM  Anemia of chronic disease secondary to CKD No overt bleeding Hemoglobin currently 7.4 Repeat cbc in AM  Leukopenia/thrombocytopenia likely secondary to acute illness Agree with hematology oncology Observe and trend  Resolved post repletion: Hypokalemia Judicious replacement of potassium due to AKI. Repeat bmet in AM  Isolated hyperbilirubinemia T bili 1.3 on 06/01/2020 Would cont to follow lft's  Chronic diastolic CHF Last 2D echo done on 02/11/2020 showed LVEF 70 to 75% and grade 1 diastolic dysfunction Repeat 2D echo reviewed. Findings of EF 47-65%YYTK grade 2 diastolic dysfunction Strict I's and O's, daily weights.  Physical debility PT to assess Fall precautions  History of breast cancer/radiation Last radiation 3 days prior to presentation affecting left breast, Nodes noted on exam, POA Wound care specialist for local wound care recommendations Continue local wound care as tolerated  Severe morbid obesity BMI 61.23 Recommend weight loss outpatient when stable from her acute illness and her cancer Will benefit from referral to bariatric weight loss center   DVT prophylaxis: Heparin subq Code Status: Full Family Communication: Pt in room, family not at bedside  Status is: Inpatient  Remains inpatient appropriate because:Hemodynamically unstable, Ongoing diagnostic testing needed not appropriate for outpatient work up, Unsafe d/c plan and IV treatments appropriate due to intensity of illness or inability to take PO   Dispo:  Patient From: Home  Planned Disposition:  Home with Health Care Svc  Expected discharge date: 06/05/20  Medically stable for discharge: No        Consultants:   Nephrology  Hematology  Procedures:     Antimicrobials: Anti-infectives (From admission, onward)   None        Subjective: Without complaints this afternoon  Objective: Vitals:   06/03/20 1252 06/03/20 2116 06/04/20 0631 06/04/20 1429  BP: 139/75 128/70 114/77 (!) 113/59  Pulse: 72 80 73 75  Resp: 20 19 19 17   Temp: (!) 97.5 F (36.4 C) 97.9 F (36.6 C) 98 F (36.7 C) 98.4 F (36.9 C)  TempSrc: Oral Oral Oral Oral  SpO2: 100% 100% 100% 100%  Weight:      Height:        Intake/Output Summary (Last 24 hours) at 06/04/2020 1644 Last data filed at 06/04/2020 1028 Gross per 24 hour  Intake --  Output 1600 ml  Net -1600 ml   Filed Weights   05/31/20 1659 06/02/20 1721  Weight: (!) 140.6 kg (!) 147 kg    Examination:  General exam: Appears calm and comfortable  Respiratory system: Clear to auscultation. Respiratory effort normal. Cardiovascular system: S1 & S2 heard, Regular Gastrointestinal system: Abdomen is nondistended, soft and nontender. No organomegaly or masses felt. Normal bowel sounds heard. Central nervous system: Alert and oriented. No focal neurological deficits. Extremities: Symmetric 5 x 5 power, BLE edema improving Skin: No rashes, lesions  Psychiatry: Judgement and insight appear normal. Mood & affect appropriate.   Data Reviewed: I have personally reviewed following labs and imaging studies  CBC: Recent Labs  Lab 05/31/20 1534 06/01/20 0423 06/03/20 0100 06/04/20 0503  WBC 3.8* 2.9* 2.9* 5.3  NEUTROABS 2.5 1.8  --   --   HGB 7.9* 7.5* 7.7* 7.4*  HCT 21.8* 20.7* 20.9* 20.4*  MCV 93.6 95.0 93.3 93.6  PLT 134* 136* 128* 932*   Basic Metabolic Panel: Recent Labs  Lab 05/31/20 1923 06/01/20 0153 06/01/20 0423 06/01/20 0423 06/01/20 0700 06/01/20 1411 06/03/20 0100 06/03/20 1506 06/04/20 0503  NA 123*   < > 120*   < > 119* 121* 125* 123* 123*  K 3.6   < > 3.3*   < > 3.2* 3.5 3.8 3.6 3.6  CL 89*   < > 89*   < > 88* 88* 91* 90* 91*  CO2 15*   < > 16*   < > 16* 17* 18* 20* 20*  GLUCOSE 67*   < > 80   < > 81 94 146* 172* 132*  BUN 48*   < >  52*   < > 53* 53* 57* 60* 63*  CREATININE 4.32*   < > 4.29*   < > 4.15* 4.19* 3.76* 3.62* 3.48*  CALCIUM 8.4*   < > 8.1*   < > 8.1* 8.4* 8.9 8.8* 8.6*  MG 1.6*  --  1.9  --   --   --   --   --   --   PHOS 6.2*  --  5.8*  --   --   --   --   --   --    < > = values in this interval not displayed.   GFR: Estimated Creatinine Clearance: 22.6 mL/min (A) (by C-G formula based on SCr of 3.48 mg/dL (H)). Liver Function Tests: Recent Labs  Lab 05/31/20 1534 06/01/20 0423  AST 48* 40  ALT 28 24  ALKPHOS 94 87  BILITOT 0.9 1.3*  PROT 6.9 6.6  ALBUMIN 2.7* 3.0*   No results for input(s): LIPASE, AMYLASE in the last 168 hours. Recent Labs  Lab 05/31/20 1923  AMMONIA 39*   Coagulation Profile: Recent Labs  Lab 05/31/20 1534  INR 1.2   Cardiac Enzymes: No results for input(s): CKTOTAL, CKMB, CKMBINDEX, TROPONINI in the last 168 hours. BNP (last 3 results) No results for input(s): PROBNP in the last 8760 hours. HbA1C: No results for input(s): HGBA1C in the last 72 hours. CBG: Recent Labs  Lab 05/31/20 2042 05/31/20 2245 06/01/20 0150  GLUCAP 68* 77 78   Lipid Profile: No results for input(s): CHOL, HDL, LDLCALC, TRIG, CHOLHDL, LDLDIRECT in the last 72 hours. Thyroid Function Tests: No results for input(s): TSH, T4TOTAL, FREET4, T3FREE, THYROIDAB in the last 72 hours. Anemia Panel: No results for input(s): VITAMINB12, FOLATE, FERRITIN, TIBC, IRON, RETICCTPCT in the last 72 hours. Sepsis Labs: Recent Labs  Lab 05/31/20 1923  LATICACIDVEN 1.0    Recent Results (from the past 240 hour(s))  Respiratory Panel by RT PCR (Flu A&B, Covid) - Nasopharyngeal Swab     Status: None   Collection Time: 05/31/20  7:23 PM   Specimen: Nasopharyngeal Swab  Result Value Ref Range Status   SARS Coronavirus 2 by RT PCR NEGATIVE NEGATIVE Final    Comment: (NOTE) SARS-CoV-2 target nucleic acids are NOT DETECTED.  The SARS-CoV-2 RNA is generally detectable in upper respiratoy specimens  during the acute phase of infection. The lowest concentration of SARS-CoV-2 viral copies this assay can detect is 131 copies/mL. A negative result does not preclude SARS-Cov-2 infection and should not be used as the sole basis for treatment or other patient management decisions. A negative result may occur with  improper specimen collection/handling, submission of specimen other than nasopharyngeal swab, presence of viral mutation(s) within the areas targeted by this assay, and inadequate number of viral copies (<131 copies/mL). A negative result must be combined with clinical observations, patient history, and epidemiological information. The expected result is Negative.  Fact Sheet for Patients:  PinkCheek.be  Fact Sheet for Healthcare Providers:  GravelBags.it  This test is no t yet approved or cleared by the [place] FDA and  has been authorized for detection and/or diagnosis of SARS-CoV-2 by FDA under an Emergency Use Authorization (EUA). This EUA will remain  in effect (meaning this test can be used) for the duration of the COVID-19 declaration under Section 564(b)(1) of the Act, 21 U.S.C. section 360bbb-3(b)(1), unless the authorization is terminated or revoked sooner.     Influenza A by PCR NEGATIVE NEGATIVE Final   Influenza B by PCR NEGATIVE NEGATIVE Final    Comment: (NOTE) The Xpert Xpress SARS-CoV-2/FLU/RSV assay is intended as an aid in  the diagnosis of influenza from Nasopharyngeal swab specimens and  should not be used as a sole basis for treatment. Nasal washings and  aspirates are unacceptable for Xpert Xpress SARS-CoV-2/FLU/RSV  testing.  Fact Sheet for Patients: PinkCheek.be  Fact Sheet for Healthcare Providers: GravelBags.it  This test is not yet approved or cleared by the [place] FDA and  has been authorized for detection and/or  diagnosis of SARS-CoV-2 by  FDA under an Emergency Use Authorization (EUA). This EUA will remain  in effect (meaning this test can be used) for the duration of the  Covid-19 declaration under Section 564(b)(1) of the Act, 21  U.S.C. section 360bbb-3(b)(1), unless the authorization is  terminated or revoked. Performed at [facility], [place] [address], [place], [place] [address]  Radiology Studies: ECHOCARDIOGRAM COMPLETE  Result Date: 06/04/2020    ECHOCARDIOGRAM REPORT   Patient Name:   ANAMARI GALEAS Date of Exam: 06/04/2020 Medical Rec #:  [id number]     Height:       61.0 in Accession #:    [id number]    Weight:       324.1 lb Date of Birth:  Aug 23, 1956    BSA:          2.319 m Patient Age:    24 years      BP:           114/77 mmHg Patient Gender: F             HR:           71 bpm. Exam Location:  Inpatient Procedure: 2D Echo, Cardiac Doppler and Color Doppler Indications:    Anasarca [id number]  History:        Patient has prior history of Echocardiogram examinations, most                 recent 02/11/2020. Risk Factors:Hypertension. Breast cancer.                 Edema.  Sonographer:    Roseanna Rainbow RDCS Referring Phys: [id number]  Sonographer Comments: Technically difficult study due to poor echo windows and patient is morbidly obese. Image acquisition challenging due to patient body habitus and Image acquisition challenging due to mastectomy. IMPRESSIONS  1. Left ventricular ejection fraction, by estimation, is 60 to 65%. The left ventricle has normal function. The left ventricle has no regional wall motion abnormalities. Left ventricular diastolic parameters are consistent with Grade II diastolic dysfunction (pseudonormalization). Elevated left ventricular end-diastolic pressure.  2. Right ventricular systolic function is normal. The right ventricular size is normal. There is moderately elevated pulmonary artery systolic pressure.  3. Left atrial size was mildly dilated.   4. The mitral valve is normal in structure. Trivial mitral valve regurgitation. No evidence of mitral stenosis.  5. The aortic valve was not well visualized. Aortic valve regurgitation is not visualized. Mild aortic valve stenosis.  6. Aortic dilatation noted. There is mild dilatation of the ascending aorta, measuring 38 mm.  7. The inferior vena cava is normal in size with greater than 50% respiratory variability, suggesting right atrial pressure of 3 mmHg. FINDINGS  Left Ventricle: Left ventricular ejection fraction, by estimation, is 60 to 65%. The left ventricle has normal function. The left ventricle has no regional wall motion abnormalities. The left ventricular internal cavity size was normal in size. There is  no left ventricular hypertrophy. Left ventricular diastolic parameters are consistent with Grade II diastolic dysfunction (pseudonormalization). Elevated left ventricular end-diastolic pressure. Right Ventricle: The right ventricular size is normal. No increase in right ventricular wall thickness. Right ventricular systolic function is normal. There is moderately elevated pulmonary artery systolic pressure. The tricuspid regurgitant velocity is 2.92 m/s, and with an assumed right atrial pressure of 15 mmHg, the estimated right ventricular systolic pressure is 44.0 mmHg. Left Atrium: Left atrial size was mildly dilated. Right Atrium: Right atrial size was normal in size. Pericardium: There is no evidence of pericardial effusion. Mitral Valve: The mitral valve is normal in structure. Trivial mitral valve regurgitation. No evidence of mitral valve stenosis. MV peak gradient, 14.5 mmHg. The mean mitral valve gradient is 4.5 mmHg. Tricuspid Valve: The tricuspid valve is normal in structure. Tricuspid valve regurgitation is mild . No evidence of tricuspid stenosis. Aortic  Valve: The aortic valve was not well visualized. Aortic valve regurgitation is not visualized. Mild aortic stenosis is present. Aortic  valve mean gradient measures 10.5 mmHg. Aortic valve peak gradient measures 22.3 mmHg. Aortic valve area, by VTI  measures 1.31 cm. Pulmonic Valve: The pulmonic valve was normal in structure. Pulmonic valve regurgitation is not visualized. No evidence of pulmonic stenosis. Aorta: The aortic root is normal in size and structure and aortic dilatation noted. There is mild dilatation of the ascending aorta, measuring 38 mm. Venous: The inferior vena cava is normal in size with greater than 50% respiratory variability, suggesting right atrial pressure of 3 mmHg. IAS/Shunts: No atrial level shunt detected by color flow Doppler.  LEFT VENTRICLE PLAX 2D LVIDd:         3.90 cm     Diastology LVIDs:         2.10 cm     LV e' medial:    6.42 cm/s LV PW:         1.00 cm     LV E/e' medial:  24.1 LV IVS:        1.00 cm     LV e' lateral:   7.07 cm/s LVOT diam:     1.70 cm     LV E/e' lateral: 21.9 LV SV:         58 LV SV Index:   25 LVOT Area:     2.27 cm  LV Volumes (MOD) LV vol d, MOD A2C: 69.3 ml LV vol d, MOD A4C: 83.7 ml LV vol s, MOD A2C: 19.8 ml LV vol s, MOD A4C: 22.6 ml LV SV MOD A2C:     49.5 ml LV SV MOD A4C:     83.7 ml LV SV MOD BP:      56.9 ml LEFT ATRIUM           Index       RIGHT ATRIUM           Index LA diam:      3.90 cm 1.68 cm/m  RA Area:     17.40 cm LA Vol (A2C): 32.6 ml 14.06 ml/m RA Volume:   39.60 ml  17.08 ml/m LA Vol (A4C): 21.1 ml 9.10 ml/m  AORTIC VALVE AV Area (Vmax):    1.53 cm AV Area (Vmean):   1.48 cm AV Area (VTI):     1.31 cm AV Vmax:           236.00 cm/s AV Vmean:          152.500 cm/s AV VTI:            0.446 m AV Peak Grad:      22.3 mmHg AV Mean Grad:      10.5 mmHg LVOT Vmax:         159.00 cm/s LVOT Vmean:        99.300 cm/s LVOT VTI:          0.257 m LVOT/AV VTI ratio: 0.58  AORTA Ao Root diam: 2.40 cm Ao Asc diam:  3.80 cm MITRAL VALVE                TRICUSPID VALVE MV Area (PHT): 2.76 cm     TR Peak grad:   34.1 mmHg MV Peak grad:  14.5 mmHg    TR Vmax:        292.00  cm/s MV Mean grad:  4.5 mmHg MV Vmax:       1.91 m/s  SHUNTS MV Vmean:      90.4 cm/s    Systemic VTI:  0.26 m MV Decel Time: 275 msec     Systemic Diam: 1.70 cm MV E velocity: 155.00 cm/s MV A velocity: 89.60 cm/s MV E/A ratio:  1.73 Jenkins Rouge MD Electronically signed by Jenkins Rouge MD Signature Date/Time: 06/04/2020/12:52:51 PM    Final     Scheduled Meds: . anastrozole  1 mg Oral Daily  . Chlorhexidine Gluconate Cloth  6 each Topical Daily  . dexamethasone  4 mg Oral Daily  . docusate sodium  100 mg Oral BID  . feeding supplement (ENSURE ENLIVE)  237 mL Oral BID BM  . feeding supplement (KATE FARMS STANDARD 1.4)  325 mL Oral BID BM  . folic acid  1 mg Oral Daily  . gabapentin  100 mg Oral BID  . heparin  5,000 Units Subcutaneous Q8H  . mineral oil-hydrophilic petrolatum   Topical BID  . multivitamin with minerals  1 tablet Oral Daily  . nystatin   Topical TID  . sodium chloride flush  3 mL Intravenous Q12H  . sodium chloride flush  3 mL Intravenous Q12H  . thiamine  100 mg Oral Daily   Or  . thiamine  100 mg Intravenous Daily   Continuous Infusions: . sodium chloride    . furosemide 120 mg (06/04/20 0937)     LOS: 4 days   Marylu Lund, MD [facility] Pager On Amion  If 7PM-7AM, please contact night-coverage 06/04/2020, 4:44 PM

## 2020-06-04 NOTE — Progress Notes (Signed)
  Echocardiogram 2D Echocardiogram has been performed.  [name] 06/04/2020, 12:19 PM

## 2020-06-04 NOTE — Progress Notes (Signed)
[facility] Progress Note  Subjective: swelling better, 500 cc recorded UOP yest and 1.4 L today so far. Na 123 today .  Up in chair  Vitals:   06/03/20 1252 06/03/20 2116 06/04/20 0631 06/04/20 1429  BP: 139/75 128/70 114/77 (!) 113/59  Pulse: 72 80 73 75  Resp: '20 19 19 17  ' Temp: (!) 97.5 F (36.4 C) 97.9 F (36.6 C) 98 F (36.7 C) 98.4 F (36.9 C)  TempSrc: Oral Oral Oral Oral  SpO2: 100% 100% 100% 100%  Weight:      Height:        Exam:  alert, nad , morbidly obese  no jvd  Chest rales L base, poor resp effort  Cor reg no RG  Abd soft ntnd no ascites, +dependent pannus edema   Ext 2 + LE hip and pretib dependent edema   Alert, NF, ox3, no asterixis      Date   Creat   eGFR   2009- 2018  0.67- 1.00   2020   0.66- 1.30   10/2019  0.70   10/2019  0.66   11/2019  1.26- 2.40   01/02/2020  1.48   5/25- 01/28/20  6.02 >> 1.98  AKI episode due to vol depletion and nephrotoxic agents   01/2020  1.2- 1.7  35- 55 ml/min     02/2020  0.80- 1.67  39- > 60 ml/min     03/2020  1.20   04/2020  1.07- 2.77   Oct 2- 3, 2021 4.49 >> 4.19         Renal US >>   11- 13 cm kidneys w/o hydro and normal echo, L kidney not well visualized due to body habitus     UA  10/2 >> 100 prot, 0-5 rbc/ wbc, many bact      Serum Osm 270-274      UNa < 10 on 10/2, Uosm 198      UCr 176 on 10/2     10/3 - Na 121 K 3.5  CO2 17  BUN 53  Cr 4.19  Ca 8.4, Alb 3.0, tprot 6.6                   WBC 2.9, Hb 7.5  plt 136        CXR 10/2 - read as vasc congestion, but appears normal to me     ECHO last done June 2021 - LVEF 60-70%, G1DD   Assessment/ Plan: 1. AoCKD 3a - b/l creat 1.2- 1.7, eGFR 35- 55 ml/min. AKI due to nsaids/ cardiorenal / hemodynamic. BP's are high, large R sided edema, low UNa. UA wnl so doubt GN, no obstruction on [place]. CXR clear from 10/02. Echo unchanged from prior, EF wnl. Creat down 4.2 > 3.7 > 3.4 today. Cont IV lasix 120 tid. Will follow.  2. Hyponatremia - hypervolemic  w/ edematous state. Na+ down to 123 today. Cont lasix, change to renal diet w/ 1 L fluid restriction.  3. Hx breast cancer 4. Anemia/ pancytopenia - not sure cause, hx prior chemoRx.  Get myeloma studies.  5. HTN - bp's down , would dc coreg if possible while diuresing     Rob Eduard Penkala 06/04/2020, 3:57 PM   Recent Labs  Lab 05/31/20 1923 06/01/20 0153 06/01/20 0423 06/01/20 0700 06/03/20 0100 06/03/20 0100 06/03/20 1506 06/04/20 0503  K 3.6   < > 3.3*   < > 3.8   < > 3.6 3.6  BUN 48*   < >  52*   < > 57*   < > 60* 63*  CREATININE 4.32*   < > 4.29*   < > 3.76*   < > 3.62* 3.48*  CALCIUM 8.4*   < > 8.1*   < > 8.9   < > 8.8* 8.6*  PHOS 6.2*  --  5.8*  --   --   --   --   --   HGB  --   --  7.5*   < > 7.7*  --   --  7.4*   < > = values in this interval not displayed.   Inpatient medications: . anastrozole  1 mg Oral Daily  . carvedilol  12.5 mg Oral BID WC  . Chlorhexidine Gluconate Cloth  6 each Topical Daily  . dexamethasone  4 mg Oral Daily  . docusate sodium  100 mg Oral BID  . feeding supplement (ENSURE ENLIVE)  237 mL Oral BID BM  . feeding supplement (KATE FARMS STANDARD 1.4)  325 mL Oral BID BM  . folic acid  1 mg Oral Daily  . gabapentin  100 mg Oral BID  . heparin  5,000 Units Subcutaneous Q8H  . mineral oil-hydrophilic petrolatum   Topical BID  . multivitamin with minerals  1 tablet Oral Daily  . nystatin   Topical TID  . sodium chloride flush  3 mL Intravenous Q12H  . sodium chloride flush  3 mL Intravenous Q12H  . thiamine  100 mg Oral Daily   Or  . thiamine  100 mg Intravenous Daily   . sodium chloride    . furosemide 120 mg (06/04/20 0937)   sodium chloride, acetaminophen **OR** acetaminophen, HYDROcodone-acetaminophen, ondansetron **OR** ondansetron (ZOFRAN) IV, polyethylene glycol, sodium chloride flush

## 2020-06-05 DIAGNOSIS — D649 Anemia, unspecified: Secondary | ICD-10-CM | POA: Diagnosis not present

## 2020-06-05 DIAGNOSIS — N179 Acute kidney failure, unspecified: Secondary | ICD-10-CM | POA: Diagnosis not present

## 2020-06-05 LAB — CBC
HCT: 19.7 % — ABNORMAL LOW (ref 36.0–46.0)
Hemoglobin: 7.2 g/dL — ABNORMAL LOW (ref 12.0–15.0)
MCH: 34.1 pg — ABNORMAL HIGH (ref 26.0–34.0)
MCHC: 36.5 g/dL — ABNORMAL HIGH (ref 30.0–36.0)
MCV: 93.4 fL (ref 80.0–100.0)
Platelets: 138 10*3/uL — ABNORMAL LOW (ref 150–400)
RBC: 2.11 MIL/uL — ABNORMAL LOW (ref 3.87–5.11)
RDW: 14.6 % (ref 11.5–15.5)
WBC: 6 10*3/uL (ref 4.0–10.5)
nRBC: 0.3 % — ABNORMAL HIGH (ref 0.0–0.2)

## 2020-06-05 LAB — BASIC METABOLIC PANEL
Anion gap: 13 (ref 5–15)
BUN: 71 mg/dL — ABNORMAL HIGH (ref 8–23)
CO2: 21 mmol/L — ABNORMAL LOW (ref 22–32)
Calcium: 9 mg/dL (ref 8.9–10.3)
Chloride: 91 mmol/L — ABNORMAL LOW (ref 98–111)
Creatinine, Ser: 3.28 mg/dL — ABNORMAL HIGH (ref 0.44–1.00)
GFR calc non Af Amer: 14 mL/min — ABNORMAL LOW (ref >60)
Glucose, Bld: 123 mg/dL — ABNORMAL HIGH (ref 70–99)
Potassium: 4.1 mmol/L (ref 3.5–5.1)
Sodium: 125 mmol/L — ABNORMAL LOW (ref 135–145)

## 2020-06-05 LAB — PROTEIN / CREATININE RATIO, URINE
Creatinine, Urine: 37.82 mg/dL
Protein Creatinine Ratio: 0.48 mg/mg{Cre} — ABNORMAL HIGH (ref 0.00–0.15)
Total Protein, Urine: 18 mg/dL

## 2020-06-05 MED ORDER — FUROSEMIDE 10 MG/ML IJ SOLN
160.0000 mg | Freq: Three times a day (TID) | INTRAVENOUS | Status: DC
  Administered 2020-06-05 – 2020-06-08 (×8): 160 mg via INTRAVENOUS
  Filled 2020-06-05: qty 10
  Filled 2020-06-05: qty 16
  Filled 2020-06-05: qty 10
  Filled 2020-06-05 (×5): qty 16
  Filled 2020-06-05: qty 10
  Filled 2020-06-05: qty 16

## 2020-06-05 NOTE — Progress Notes (Signed)
  Radiation Oncology         ([phone] ________________________________  Name: [name] MRN: [id number]  Date: 05/21/2020  DOB: Apr 26, 1956  End of Treatment Note  Diagnosis:   left-sided breast cancer     Indication for treatment:  Curative       Radiation treatment dates:   03/26/20 - 05/21/20  Site/dose:   The patient initially received a dose of 50.4 Gy in 28 fractions to the chest wall and supraclavicular region. This was delivered using a 3-D conformal, 4 field technique. The patient then received a boost to the mastectomy scar. This delivered an additional 10 Gy in 5 fractions using an en face electron field. The total dose was 60.4 Gy.  Narrative: The patient tolerated radiation treatment relatively well.   The patient had some expected skin irritation as she progressed during treatment.   Plan: The patient has completed radiation treatment. The patient will return to radiation oncology clinic for routine followup in one month. I advised the patient to call or return sooner if they have any questions or concerns related to their recovery or treatment. ________________________________  Jodelle Gross, M.D., Ph.D.

## 2020-06-05 NOTE — TOC Initial Note (Addendum)
Transition of Care [facility]) - Initial/Assessment Note    Patient Details  Name: [name] MRN: [id number] Date of Birth: Jan 05, 1956  Transition of Care [facility]) CM/SW Contact:    Lia Hopping, [place] Phone Number: 06/05/2020, 2:40 PM  Clinical Narrative:                 CSW met with the patient at bedside, introduced role and reason for visit. Patient reports her 65 year old granddaughter lives in the home. She reports her granddaughter assist with toileting, meals and house keeping. Patient also has personal care services 7 days a week for 2 hours a day. Patient reports she walks short distances with a RW but primarily uses a wheelchair.She can propel herself at times but is easily fatigued so her granddaughter assist. Patient express concerns with her weight. "I have not been able to do anything in months, I am over weight and just weak." Patient is agreeable to PT recommendation for Home Health. TOC staff Barbaraann Rondo is assisting with finding a Home Health agency.  Physician recommends the patient follow up with the HF clinic. CSW reached out to agencies involved in community HF follow up. [facility] [employer] and  [place]. [place] and [facility] cannot accept the patient at this time.   CSW reached out [place] at HF clinic, Left voicemail.  Received call from Rebecca. Patient will need a referral from Cardiologist if criteria is met.  -Dr. Earlie Counts recommends follow up with Cardiology when the patient is ultimately discharged.   TOC staff will continue to coordinate HHPT/RN needs.   Expected Discharge Plan: [place] Barriers to Discharge: Continued Medical Work up   Patient Goals and CMS Choice Patient states their goals for this hospitalization and ongoing recovery are:: return home      Expected Discharge Plan and Services Expected Discharge Plan: [place] In-house Referral: Clinical Social Work Discharge  Planning Services: CM Consult Post Acute Care Choice: [place] arrangements for the past 2 months: Single Family Home                           HH Arranged: PT, RN          Prior Living Arrangements/Services Living arrangements for the past 2 months: Single Family Home Lives with:: Other (Comment) (Granddaughter) Patient language and need for interpreter reviewed:: No Do you feel safe going back to the place where you live?: Yes      Need for Family Participation in Patient Care: Yes (Comment) Care giver support system in place?: Yes (comment) Current home services: DME Criminal Activity/Legal Involvement Pertinent to Current Situation/Hospitalization: No - Comment as needed  Activities of Daily Living Home Assistive Devices/Equipment: Cane (specify quad or straight), Eyeglasses, Shower chair with back, Environmental consultant (specify type), Wheelchair, Other (Comment), Grab bars in shower, Grab bars around toilet (walk-in shower, handicap toilet, manual wheelchair, single point cane, front wheeled walker) ADL Screening (condition at time of admission) Patient's cognitive ability adequate to safely complete daily activities?: Yes Is the patient deaf or have difficulty hearing?: No Does the patient have difficulty seeing, even when wearing glasses/contacts?: No Does the patient have difficulty concentrating, remembering, or making decisions?: No Patient able to express need for assistance with ADLs?: Yes Does the patient have difficulty dressing or bathing?: Yes Independently performs ADLs?: No Communication: Independent Dressing (OT): Needs assistance Is  this a change from baseline?: Pre-admission baseline Grooming: Independent Is this a change from baseline?: Pre-admission baseline Feeding: Independent Bathing: Needs assistance Is this a change from baseline?: Pre-admission baseline Toileting: Needs assistance Is this a change from baseline?:  Pre-admission baseline In/Out Bed: Needs assistance Is this a change from baseline?: Pre-admission baseline Walks in Home: Needs assistance Is this a change from baseline?: Pre-admission baseline Does the patient have difficulty walking or climbing stairs?: Yes (secondary to weakness and swelling) Weakness of Legs: Both Weakness of Arms/Hands: None  Permission Sought/Granted Permission sought to share information with : Case Manager, Family Supports Permission granted to share information with : Yes, Verbal Permission Granted     Permission granted to share info w AGENCY: Home Health Agencies        Emotional Assessment Appearance:: Appears stated age Attitude/Demeanor/Rapport: Engaged Affect (typically observed): Accepting Orientation: : Oriented to Self, Oriented to Place, Oriented to  Time, Oriented to Situation Alcohol / Substance Use: Not Applicable Psych Involvement: No (comment)  Admission diagnosis:  Morbid obesity ([place]) [E66.01] Hyponatremia [E87.1] AKI (acute kidney injury) ([place]) [N17.9] Left arm swelling [M79.89] Anemia, unspecified type [D64.9] Malignant neoplasm of left female breast, unspecified estrogen receptor status, unspecified site of breast ([place]) [C50.912] Patient Active Problem List   Diagnosis Date Noted  . Hyponatremia 05/31/2020  . Pancytopenia ([place]) 05/31/2020  . Breast cancer ([place]) 05/31/2020  . Cellulitis of thigh 04/29/2020  . Tinea corporis 04/29/2020  . Anemia of chronic disease 04/29/2020  . Chronic kidney disease, stage 3a ([place]) 04/29/2020  . Cellulitis of abdominal wall   . Constipation   . Folate deficiency   . Hypomagnesemia   . Debility   . Gout 03/03/2020  . Skin breakdown 03/03/2020  . Skin ulcer of perineum, limited to breakdown of skin ([place]) 03/03/2020  . Leukocytosis 03/03/2020  . Anemia associated with chemotherapy 03/03/2020  . Thrombocytopenia ([place]) 03/03/2020  . Port-A-Cath in place 02/19/2020  . Acute renal failure  (ARF) ([place]) 01/22/2020  . S/P left mastectomy 12/19/2019  . Genetic testing 11/30/2019  . Morbid obesity with BMI of 60.0-69.9, adult ([place]) 11/21/2019  . Family history of ovarian cancer   . Malignant neoplasm of upper-outer quadrant of left breast in female, estrogen receptor positive ([place]) 11/14/2019  . Fracture of radial head, right, closed 06/25/2019  . Frequent falls 01/10/2019  . Bilateral bunions 01/10/2019  . Toenail fungus 01/10/2019  . Alcoholic hepatitis 04/59/9774  . Edema 11/20/2018  . Alcohol-induced mood disorder ([place]) 11/01/2013  . Alcohol dependence ([place]) 11/01/2013  . Essential hypertension 06/20/2007  . LOW BACK PAIN 06/20/2007  . DIVERTICULOSIS, COLON 05/05/2007   PCP:  [name], MD Pharmacy:   [id number] [place] [id number]: [phone]: [phone]    Social Determinants of Health ([place] Interventions    Readmission Risk Interventions Readmission Risk Prevention Plan 03/06/2020 02/13/2020  Transportation Screening Complete Complete  PCP or Specialist Appt within 3-5 Days Not Complete -  Not Complete comments Unsure of dc date at this time -  HMurd[place]or HDoylineComplete -  Social Work Consult for Recovery Care Planning/Counseling Complete -  Palliative Care Screening Not Applicable -  Medication Review (Press photographer Complete Complete  PCP or Specialist appointment within 3-5 days of discharge - Complete  HRI or HKerr- Complete  SW Recovery Care/Counseling Consult - Complete  Palliative Care Screening -  Complete  Skilled Nursing Facility - Not Applicable  Some recent data might be hidden

## 2020-06-05 NOTE — Progress Notes (Signed)
PROGRESS NOTE    [name]  ZOX:[id number] DOB: May 16, 1956 DOA: 05/31/2020 PCP: [name], MD    Brief Narrative:  64 y.o.femalewith medical history significant of breast CA sp Mastectomy and radiation on the left,morbid obesity, HTN, multiple skin ulcers, Alcohol liver disease CKD stage IIIa, Grade 1 diastolic CHF.  Presented withbilateral leg swelling and burning on the bottom.  She has been urinating a bit more than usual.  Reports she drinks 4 bottle of water a day and ensure.  She has no appetite and has not been eating.  She gained fluid weight.  Reports her ulcers have been healing.  She can walk a bit uses wheelchair.  She still drinks few mixed drinks a day 2-3/day no hx of withdraw.  Last left breast radiation therapy was 3 days ago  Reports feet are burning.  Hurts to stand up.  No diarrhea no melena no blood in stool.  Work up revealed No evidence of DVT, Hyponatremia and acute on chronic CKD3B.  TRH was asked to admit.  Nephrology was consulted and is following.  Assessment & Plan:   Active Problems:   Essential hypertension   Alcohol dependence ([place])   Edema   Malignant neoplasm of upper-outer quadrant of left breast in female, estrogen receptor positive ([place])   Morbid obesity with BMI of 60.0-69.9, adult (HCC)   Acute renal failure (ARF) (HCC)   Thrombocytopenia (HCC)   Debility   Anemia of chronic disease   Chronic kidney disease, stage 3a (HCC)   Hyponatremia   Pancytopenia (HCC)   Breast cancer (HCC)  Hypervolemic hyponatremia Had reported drinking 4 bottles of water and Ensure daily Presented with serum sodium 119, low serum osmolality, hypervolemic on exam Held V fluids per Nephrology Pt is continued on lasix gtt Sodium trends improving Repeat bmet in AM  Improving, nonoliguric AKI on CKD 3B Baseline creatinine noted to be around 2.7 Presented with creatinine of greater than 4 with GFR of 12 Creatinine downtrending down to 3.28 today Continue  to avoid nephrotoxins and hypotension Continue as per nephrology. Repeat bmet in AM  Anemia of chronic disease secondary to CKD No overt bleeding Hemoglobin currently 7.2 Repeat cbc in AM Discussed with Oncology. Pt may benefit from GI follow up   Leukopenia/thrombocytopenia likely secondary to acute illness Agree with hematology oncology Repeat cbc in AM  Resolved post repletion: Hypokalemia Corrected Repeat bmet in AM  Isolated hyperbilirubinemia T bili 1.3 on 06/01/2020 Would cont to follow lft's  Acute on Chronic diastolic CHF Last 2D echo done on 02/11/2020 showed LVEF 70 to 75% and grade 1 diastolic dysfunction Repeat 2D echo reviewed. Findings of EF 81-19%JYNW grade 2 diastolic dysfunction Continue strict I/O's Discussed case with Cardiology who agrees with diuresis above. Cardiology recommends establishing follow up with Cardiology when pt is ultimately discharged  Physical debility PT to assess Fall precautions  History of breast cancer/radiation Last radiation 3 days prior to presentation affecting left breast, Nodes noted on exam, POA Wound care specialist for local wound care recommendations Continue local wound care as pt tolerates  Severe morbid obesity BMI 61.23 Recommend diet/lifestyle modification Will benefit from referral to bariatric weight loss center  DVT prophylaxis: Heparin subq Code Status: Full Family Communication: Pt in room, family not at bedside  Status is: Inpatient  Remains inpatient appropriate because:Hemodynamically unstable, Ongoing diagnostic testing needed not appropriate for outpatient work up, Unsafe d/c plan and IV treatments appropriate due to intensity of illness or inability to take PO  Dispo:  Patient From: Home  Planned Disposition: Home with Health Care Svc  Expected discharge date: 06/07/20  Medically stable for discharge: No   Consultants:   Nephrology  Hematology  Procedures:      Antimicrobials: Anti-infectives (From admission, onward)   None      Subjective: Without complaints this afternoon  Objective: Vitals:   06/04/20 2219 06/05/20 0500 06/05/20 0546 06/05/20 1351  BP:   126/67 139/77  Pulse:   75 77  Resp:   16 18  Temp:   97.7 F (36.5 C) 98.7 F (37.1 C)  TempSrc:   Oral   SpO2:   100% 100%  Weight: (!) 145.3 kg (!) 144.3 kg    Height:        Intake/Output Summary (Last 24 hours) at 06/05/2020 1432 Last data filed at 06/05/2020 1352 Gross per 24 hour  Intake 790 ml  Output 2500 ml  Net -1710 ml   Filed Weights   06/02/20 1721 06/04/20 2219 06/05/20 0500  Weight: (!) 147 kg (!) 145.3 kg (!) 144.3 kg    Examination: General exam: Awake, laying in bed, in nad Respiratory system: Normal respiratory effort, no wheezing Cardiovascular system: regular rate, s1, s2 Gastrointestinal system: Soft, nondistended, positive BS Central nervous system: CN2-12 grossly intact, strength intact Extremities: Perfused, BLE edema improving Skin: Normal skin turgor, no notable skin lesions seen Psychiatry: Mood normal // no visual hallucinations   Data Reviewed: I have personally reviewed following labs and imaging studies  CBC: Recent Labs  Lab 05/31/20 1534 06/01/20 0423 06/03/20 0100 06/04/20 0503 06/05/20 0532  WBC 3.8* 2.9* 2.9* 5.3 6.0  NEUTROABS 2.5 1.8  --   --   --   HGB 7.9* 7.5* 7.7* 7.4* 7.2*  HCT 21.8* 20.7* 20.9* 20.4* 19.7*  MCV 93.6 95.0 93.3 93.6 93.4  PLT 134* 136* 128* 134* 496*   Basic Metabolic Panel: Recent Labs  Lab 05/31/20 1923 06/01/20 0153 06/01/20 0423 06/01/20 0700 06/01/20 1411 06/03/20 0100 06/03/20 1506 06/04/20 0503 06/05/20 0532  NA 123*   < > 120*   < > 121* 125* 123* 123* 125*  K 3.6   < > 3.3*   < > 3.5 3.8 3.6 3.6 4.1  CL 89*   < > 89*   < > 88* 91* 90* 91* 91*  CO2 15*   < > 16*   < > 17* 18* 20* 20* 21*  GLUCOSE 67*   < > 80   < > 94 146* 172* 132* 123*  BUN 48*   < > 52*   < > 53* 57* 60*  63* 71*  CREATININE 4.32*   < > 4.29*   < > 4.19* 3.76* 3.62* 3.48* 3.28*  CALCIUM 8.4*   < > 8.1*   < > 8.4* 8.9 8.8* 8.6* 9.0  MG 1.6*  --  1.9  --   --   --   --   --   --   PHOS 6.2*  --  5.8*  --   --   --   --   --   --    < > = values in this interval not displayed.   GFR: Estimated Creatinine Clearance: 23.6 mL/min (A) (by C-G formula based on SCr of 3.28 mg/dL (H)). Liver Function Tests: Recent Labs  Lab 05/31/20 1534 06/01/20 0423  AST 48* 40  ALT 28 24  ALKPHOS 94 87  BILITOT 0.9 1.3*  PROT 6.9 6.6  ALBUMIN 2.7* 3.0*  No results for input(s): LIPASE, AMYLASE in the last 168 hours. Recent Labs  Lab 05/31/20 1923  AMMONIA 39*   Coagulation Profile: Recent Labs  Lab 05/31/20 1534  INR 1.2   Cardiac Enzymes: No results for input(s): CKTOTAL, CKMB, CKMBINDEX, TROPONINI in the last 168 hours. BNP (last 3 results) No results for input(s): PROBNP in the last 8760 hours. HbA1C: No results for input(s): HGBA1C in the last 72 hours. CBG: Recent Labs  Lab 05/31/20 2042 05/31/20 2245 06/01/20 0150  GLUCAP 68* 77 78   Lipid Profile: No results for input(s): CHOL, HDL, LDLCALC, TRIG, CHOLHDL, LDLDIRECT in the last 72 hours. Thyroid Function Tests: No results for input(s): TSH, T4TOTAL, FREET4, T3FREE, THYROIDAB in the last 72 hours. Anemia Panel: No results for input(s): VITAMINB12, FOLATE, FERRITIN, TIBC, IRON, RETICCTPCT in the last 72 hours. Sepsis Labs: Recent Labs  Lab 05/31/20 1923  LATICACIDVEN 1.0    Recent Results (from the past 240 hour(s))  Respiratory Panel by RT PCR (Flu A&B, Covid) - Nasopharyngeal Swab     Status: None   Collection Time: 05/31/20  7:23 PM   Specimen: Nasopharyngeal Swab  Result Value Ref Range Status   SARS Coronavirus 2 by RT PCR NEGATIVE NEGATIVE Final    Comment: (NOTE) SARS-CoV-2 target nucleic acids are NOT DETECTED.  The SARS-CoV-2 RNA is generally detectable in upper respiratoy specimens during the acute phase  of infection. The lowest concentration of SARS-CoV-2 viral copies this assay can detect is 131 copies/mL. A negative result does not preclude SARS-Cov-2 infection and should not be used as the sole basis for treatment or other patient management decisions. A negative result may occur with  improper specimen collection/handling, submission of specimen other than nasopharyngeal swab, presence of viral mutation(s) within the areas targeted by this assay, and inadequate number of viral copies (<131 copies/mL). A negative result must be combined with clinical observations, patient history, and epidemiological information. The expected result is Negative.  Fact Sheet for Patients:  PinkCheek.be  Fact Sheet for Healthcare Providers:  GravelBags.it  This test is no t yet approved or cleared by the [place] FDA and  has been authorized for detection and/or diagnosis of SARS-CoV-2 by FDA under an Emergency Use Authorization (EUA). This EUA will remain  in effect (meaning this test can be used) for the duration of the COVID-19 declaration under Section 564(b)(1) of the Act, 21 U.S.C. section 360bbb-3(b)(1), unless the authorization is terminated or revoked sooner.     Influenza A by PCR NEGATIVE NEGATIVE Final   Influenza B by PCR NEGATIVE NEGATIVE Final    Comment: (NOTE) The Xpert Xpress SARS-CoV-2/FLU/RSV assay is intended as an aid in  the diagnosis of influenza from Nasopharyngeal swab specimens and  should not be used as a sole basis for treatment. Nasal washings and  aspirates are unacceptable for Xpert Xpress SARS-CoV-2/FLU/RSV  testing.  Fact Sheet for Patients: PinkCheek.be  Fact Sheet for Healthcare Providers: GravelBags.it  This test is not yet approved or cleared by the [place] FDA and  has been authorized for detection and/or diagnosis of  SARS-CoV-2 by  FDA under an Emergency Use Authorization (EUA). This EUA will remain  in effect (meaning this test can be used) for the duration of the  Covid-19 declaration under Section 564(b)(1) of the Act, 21  U.S.C. section 360bbb-3(b)(1), unless the authorization is  terminated or revoked. Performed at [facility], [place] [address], [place], [place] [address]      Radiology  Studies: ECHOCARDIOGRAM COMPLETE  Result Date: 06/04/2020    ECHOCARDIOGRAM REPORT   Patient Name:   [name] Date of Exam: 06/04/2020 Medical Rec #:  [id number]     Height:       61.0 in Accession #:    [id number]    Weight:       324.1 lb Date of Birth:  1955/11/25    BSA:          2.319 m Patient Age:    75 years      BP:           114/77 mmHg Patient Gender: F             HR:           71 bpm. Exam Location:  Inpatient Procedure: 2D Echo, Cardiac Doppler and Color Doppler Indications:    Anasarca [id number]  History:        Patient has prior history of Echocardiogram examinations, most                 recent 02/11/2020. Risk Factors:Hypertension. Breast cancer.                 Edema.  Sonographer:    Roseanna Rainbow RDCS Referring Phys: [id number] [facility]  Sonographer Comments: Technically difficult study due to poor echo windows and patient is morbidly obese. Image acquisition challenging due to patient body habitus and Image acquisition challenging due to mastectomy. IMPRESSIONS  1. Left ventricular ejection fraction, by estimation, is 60 to 65%. The left ventricle has normal function. The left ventricle has no regional wall motion abnormalities. Left ventricular diastolic parameters are consistent with Grade II diastolic dysfunction (pseudonormalization). Elevated left ventricular end-diastolic pressure.  2. Right ventricular systolic function is normal. The right ventricular size is normal. There is moderately elevated pulmonary artery systolic pressure.  3. Left atrial size was mildly dilated.  4. The  mitral valve is normal in structure. Trivial mitral valve regurgitation. No evidence of mitral stenosis.  5. The aortic valve was not well visualized. Aortic valve regurgitation is not visualized. Mild aortic valve stenosis.  6. Aortic dilatation noted. There is mild dilatation of the ascending aorta, measuring 38 mm.  7. The inferior vena cava is normal in size with greater than 50% respiratory variability, suggesting right atrial pressure of 3 mmHg. FINDINGS  Left Ventricle: Left ventricular ejection fraction, by estimation, is 60 to 65%. The left ventricle has normal function. The left ventricle has no regional wall motion abnormalities. The left ventricular internal cavity size was normal in size. There is  no left ventricular hypertrophy. Left ventricular diastolic parameters are consistent with Grade II diastolic dysfunction (pseudonormalization). Elevated left ventricular end-diastolic pressure. Right Ventricle: The right ventricular size is normal. No increase in right ventricular wall thickness. Right ventricular systolic function is normal. There is moderately elevated pulmonary artery systolic pressure. The tricuspid regurgitant velocity is 2.92 m/s, and with an assumed right atrial pressure of 15 mmHg, the estimated right ventricular systolic pressure is 76.2 mmHg. Left Atrium: Left atrial size was mildly dilated. Right Atrium: Right atrial size was normal in size. Pericardium: There is no evidence of pericardial effusion. Mitral Valve: The mitral valve is normal in structure. Trivial mitral valve regurgitation. No evidence of mitral valve stenosis. MV peak gradient, 14.5 mmHg. The mean mitral valve gradient is 4.5 mmHg. Tricuspid Valve: The tricuspid valve is normal in structure. Tricuspid valve regurgitation is mild . No evidence of tricuspid stenosis. Aortic Valve:  The aortic valve was not well visualized. Aortic valve regurgitation is not visualized. Mild aortic stenosis is present. Aortic valve mean  gradient measures 10.5 mmHg. Aortic valve peak gradient measures 22.3 mmHg. Aortic valve area, by VTI  measures 1.31 cm. Pulmonic Valve: The pulmonic valve was normal in structure. Pulmonic valve regurgitation is not visualized. No evidence of pulmonic stenosis. Aorta: The aortic root is normal in size and structure and aortic dilatation noted. There is mild dilatation of the ascending aorta, measuring 38 mm. Venous: The inferior vena cava is normal in size with greater than 50% respiratory variability, suggesting right atrial pressure of 3 mmHg. IAS/Shunts: No atrial level shunt detected by color flow Doppler.  LEFT VENTRICLE PLAX 2D LVIDd:         3.90 cm     Diastology LVIDs:         2.10 cm     LV e' medial:    6.42 cm/s LV PW:         1.00 cm     LV E/e' medial:  24.1 LV IVS:        1.00 cm     LV e' lateral:   7.07 cm/s LVOT diam:     1.70 cm     LV E/e' lateral: 21.9 LV SV:         58 LV SV Index:   25 LVOT Area:     2.27 cm  LV Volumes (MOD) LV vol d, MOD A2C: 69.3 ml LV vol d, MOD A4C: 83.7 ml LV vol s, MOD A2C: 19.8 ml LV vol s, MOD A4C: 22.6 ml LV SV MOD A2C:     49.5 ml LV SV MOD A4C:     83.7 ml LV SV MOD BP:      56.9 ml LEFT ATRIUM           Index       RIGHT ATRIUM           Index LA diam:      3.90 cm 1.68 cm/m  RA Area:     17.40 cm LA Vol (A2C): 32.6 ml 14.06 ml/m RA Volume:   39.60 ml  17.08 ml/m LA Vol (A4C): 21.1 ml 9.10 ml/m  AORTIC VALVE AV Area (Vmax):    1.53 cm AV Area (Vmean):   1.48 cm AV Area (VTI):     1.31 cm AV Vmax:           236.00 cm/s AV Vmean:          152.500 cm/s AV VTI:            0.446 m AV Peak Grad:      22.3 mmHg AV Mean Grad:      10.5 mmHg LVOT Vmax:         159.00 cm/s LVOT Vmean:        99.300 cm/s LVOT VTI:          0.257 m LVOT/AV VTI ratio: 0.58  AORTA Ao Root diam: 2.40 cm Ao Asc diam:  3.80 cm MITRAL VALVE                TRICUSPID VALVE MV Area (PHT): 2.76 cm     TR Peak grad:   34.1 mmHg MV Peak grad:  14.5 mmHg    TR Vmax:        292.00 cm/s MV Mean  grad:  4.5 mmHg MV Vmax:       1.91 m/s  SHUNTS MV Vmean:      90.4 cm/s    Systemic VTI:  0.26 m MV Decel Time: 275 msec     Systemic Diam: 1.70 cm MV E velocity: 155.00 cm/s MV A velocity: 89.60 cm/s MV E/A ratio:  1.73 Jenkins Rouge MD Electronically signed by Jenkins Rouge MD Signature Date/Time: 06/04/2020/12:52:51 PM    Final     Scheduled Meds: . anastrozole  1 mg Oral Daily  . Chlorhexidine Gluconate Cloth  6 each Topical Daily  . dexamethasone  4 mg Oral Daily  . docusate sodium  100 mg Oral BID  . feeding supplement (ENSURE ENLIVE)  237 mL Oral BID BM  . feeding supplement (KATE FARMS STANDARD 1.4)  325 mL Oral BID BM  . folic acid  1 mg Oral Daily  . gabapentin  100 mg Oral BID  . heparin  5,000 Units Subcutaneous Q8H  . mineral oil-hydrophilic petrolatum   Topical BID  . multivitamin with minerals  1 tablet Oral Daily  . nystatin   Topical TID  . sodium chloride flush  3 mL Intravenous Q12H  . sodium chloride flush  3 mL Intravenous Q12H  . thiamine  100 mg Oral Daily   Or  . thiamine  100 mg Intravenous Daily   Continuous Infusions: . sodium chloride    . furosemide 120 mg (06/05/20 0947)     LOS: 5 days   Marylu Lund, MD [facility] Pager On Amion  If 7PM-7AM, please contact night-coverage 06/05/2020, 2:32 PM

## 2020-06-05 NOTE — Consult Note (Signed)
   [facility] CM Inpatient Consult   06/05/2020  [name] 07/03/1956 [id number]   Patient chart has been reviewed for readmissions less than 30 days and for high risk score for unplanned readmissions.  Patient assessed for community [place] Management follow up needs. Per review, patient is being followed through Managed Medicaid case management program.   Will continue to follow for progression and disposition plans.  Netta Cedars, MSN, [facility] Liaison Nurse Mobile Phone [phone]  Toll free office [phone]

## 2020-06-05 NOTE — Progress Notes (Signed)
[facility] Progress Note  Subjective: swelling better, 2 L UOP yest, creat down 3.2 and Na 125 today  Vitals:   06/04/20 2219 06/05/20 0500 06/05/20 0546 06/05/20 1351  BP:   126/67 139/77  Pulse:   75 77  Resp:   16 18  Temp:   97.7 F (36.5 C) 98.7 F (37.1 C)  TempSrc:   Oral   SpO2:   100% 100%  Weight: (!) 145.3 kg (!) 144.3 kg    Height:        Exam:  alert, nad , morbidly obese  no jvd  Chest rales L base, poor resp effort  Cor reg no RG  Abd soft ntnd no ascites, +1-2+ dependent pannus edema   Ext 2 + LE hip and pretib dependent edema   Alert, NF, ox3, no asterixis      Date   Creat   eGFR   2009- 2018  0.67- 1.00   2020   0.66- 1.30   10/2019  0.70   10/2019  0.66   11/2019  1.26- 2.40   01/02/2020  1.48   5/25- 01/28/20  6.02 >> 1.98  AKI episode    01/2020  1.2- 1.7  35- 55 ml/min     02/2020  0.80- 1.67  39- > 60 ml/min     03/2020  1.20   04/2020  1.07- 2.77   Oct 2- 3, 2021 4.49 >> 4.19         Renal US >>   11- 13 cm kidneys w/o hydro and normal echo, L kidney not well visualized due to body habitus     UA  10/2 >> 100 prot, 0-5 rbc/ wbc, many bact      Serum Osm 270-274      UNa < 10 on 10/2, Uosm 198      UCr 176 on 10/2       CXR 10/2 - read as vasc congestion, but appears normal to me     ECHO last done June 2021 - LVEF 60-70%, G1DD   Assessment/ Plan: 1. AoCKD 3a - b/l creat 1.2- 1.7, eGFR 35- 55 ml/min. AKI due to nsaids/ cardiorenal / hemodynamic. BP's high w/ sig vol overload/ anasarca, low UNa. UA wnl so doubt GN, no obstruction on [place]. CXR clear from 10/02. Suspect poss cardiorenal etiology w/ diast CHF. Check UPC ratio.  Creat down 4.2 > 3.7 > 3.4 > 3.2 today. Will ^IV lasix to 160 tid.  2. Hyponatremia - hypervolemic w/ edematous state. Na+ up to 125 today. Cont renal diet w/ 1 L fluid restriction and IV lasix 3. Hx breast cancer 4. Anemia/ pancytopenia - not sure cause, hx prior chemoRx.  Get myeloma studies.  5. HTN - bp's  down yest, coreg dc'd, BP's much better today     Rob Anorah Trias 06/05/2020, 3:36 PM   Recent Labs  Lab 05/31/20 1923 06/01/20 0153 06/01/20 0423 06/01/20 0700 06/04/20 0503 06/05/20 0532  K 3.6   < > 3.3*   < > 3.6 4.1  BUN 48*   < > 52*   < > 63* 71*  CREATININE 4.32*   < > 4.29*   < > 3.48* 3.28*  CALCIUM 8.4*   < > 8.1*   < > 8.6* 9.0  PHOS 6.2*  --  5.8*  --   --   --   HGB  --   --  7.5*   < > 7.4* 7.2*   < > =  values in this interval not displayed.   Inpatient medications: . anastrozole  1 mg Oral Daily  . Chlorhexidine Gluconate Cloth  6 each Topical Daily  . dexamethasone  4 mg Oral Daily  . docusate sodium  100 mg Oral BID  . feeding supplement (ENSURE ENLIVE)  237 mL Oral BID BM  . feeding supplement (KATE FARMS STANDARD 1.4)  325 mL Oral BID BM  . folic acid  1 mg Oral Daily  . gabapentin  100 mg Oral BID  . heparin  5,000 Units Subcutaneous Q8H  . mineral oil-hydrophilic petrolatum   Topical BID  . multivitamin with minerals  1 tablet Oral Daily  . nystatin   Topical TID  . sodium chloride flush  3 mL Intravenous Q12H  . sodium chloride flush  3 mL Intravenous Q12H  . thiamine  100 mg Oral Daily   Or  . thiamine  100 mg Intravenous Daily   . sodium chloride    . furosemide 120 mg (06/05/20 0947)   sodium chloride, acetaminophen **OR** acetaminophen, HYDROcodone-acetaminophen, ondansetron **OR** ondansetron (ZOFRAN) IV, polyethylene glycol, sodium chloride flush

## 2020-06-06 DIAGNOSIS — D649 Anemia, unspecified: Secondary | ICD-10-CM | POA: Diagnosis not present

## 2020-06-06 DIAGNOSIS — D638 Anemia in other chronic diseases classified elsewhere: Secondary | ICD-10-CM | POA: Diagnosis not present

## 2020-06-06 DIAGNOSIS — N179 Acute kidney failure, unspecified: Secondary | ICD-10-CM | POA: Diagnosis not present

## 2020-06-06 LAB — BASIC METABOLIC PANEL
Anion gap: 10 (ref 5–15)
BUN: 78 mg/dL — ABNORMAL HIGH (ref 8–23)
CO2: 23 mmol/L (ref 22–32)
Calcium: 9.1 mg/dL (ref 8.9–10.3)
Chloride: 93 mmol/L — ABNORMAL LOW (ref 98–111)
Creatinine, Ser: 3.25 mg/dL — ABNORMAL HIGH (ref 0.44–1.00)
GFR calc non Af Amer: 14 mL/min — ABNORMAL LOW (ref >60)
Glucose, Bld: 126 mg/dL — ABNORMAL HIGH (ref 70–99)
Potassium: 3.9 mmol/L (ref 3.5–5.1)
Sodium: 126 mmol/L — ABNORMAL LOW (ref 135–145)

## 2020-06-06 MED ORDER — METOLAZONE 5 MG PO TABS
10.0000 mg | ORAL_TABLET | Freq: Every day | ORAL | Status: DC
  Administered 2020-06-06 – 2020-06-08 (×3): 10 mg via ORAL
  Filled 2020-06-06 (×4): qty 2

## 2020-06-06 NOTE — Progress Notes (Signed)
PROGRESS NOTE    [name]  XBW:[id number] DOB: [birth year] DOA: 05/31/2020 PCP: [name], MD    Brief Narrative:  64 y.o.femalewith medical history significant of breast CA sp Mastectomy and radiation on the left,morbid obesity, HTN, multiple skin ulcers, Alcohol liver disease CKD stage IIIa, Grade 1 diastolic CHF.  Presented withbilateral leg swelling and burning on the bottom.  She has been urinating a bit more than usual.  Reports she drinks 4 bottle of water a day and ensure.  She has no appetite and has not been eating.  She gained fluid weight.  Reports her ulcers have been healing.  She can walk a bit uses wheelchair.  She still drinks few mixed drinks a day 2-3/day no hx of withdraw.  Last left breast radiation therapy was 3 days ago  Reports feet are burning.  Hurts to stand up.  No diarrhea no melena no blood in stool.  Work up revealed No evidence of DVT, Hyponatremia and acute on chronic CKD3B.  TRH was asked to admit.  Nephrology was consulted and is following.  Assessment & Plan:   Active Problems:   Essential hypertension   Alcohol dependence ([place])   Edema   Malignant neoplasm of upper-outer quadrant of left breast in female, estrogen receptor positive ([place])   Morbid obesity with BMI of 60.0-69.9, adult (HCC)   Acute renal failure (ARF) (HCC)   Thrombocytopenia (HCC)   Debility   Anemia of chronic disease   Chronic kidney disease, stage 3a (HCC)   Hyponatremia   Pancytopenia (HCC)   Breast cancer (HCC)  Hypervolemic hyponatremia Presented with serum sodium 119, low serum osmolality, hypervolemic on exam Held V fluids per Nephrology Pt is continued on lasix gtt q8hrs Sodium trends are improving Recheck bmet in AM  Improving, nonoliguric AKI on CKD 3B Baseline creatinine noted to be around 2.7 Presented with creatinine of greater than 4 with GFR of 12 Creatinine downtrending down to 3.25 today Continue to avoid nephrotoxins and hypotension Continue  diuresis as per nephrology. Repeat bmet in AM  Anemia of chronic disease secondary to CKD No overt bleeding Hemoglobin currently 7.2 Repeat cbc in AM Discussed with Oncology. Pt may benefit from GI follow up on d/c  Leukopenia/thrombocytopenia likely secondary to acute illness WBC improved, plts remain stable Repeat cbc in AM  Resolved post repletion: Hypokalemia Corrected Recheck bmet in AM  Isolated hyperbilirubinemia T bili 1.3 on 06/01/2020 Suspect element of hepatic congestion related to below acute CHF  Acute on Chronic diastolic CHF Last 2D echo done on 02/11/2020 showed LVEF 70 to 75% and grade 1 diastolic dysfunction Repeat 2D echo reviewed. Findings of EF 16-38%GTXM grade 2 diastolic dysfunction Continue strict I/O's Recently discussed case with Cardiology who agrees with plan for diuresis above. Cardiology recommends establishing follow up with Cardiology when pt is ultimately discharged  Physical debility PT to assess Fall precautions  History of breast cancer/radiation Last radiation 3 days prior to presentation affecting left breast, Nodes noted on exam, POA Wound care specialist for local wound care recommendations Continue local wound care as toleraetd  Severe morbid obesity BMI 61.23 Recommend diet/lifestyle modification Will benefit from referral to bariatric weight loss center  DVT prophylaxis: Heparin subq Code Status: Full Family Communication: Pt in room, family not at bedside  Status is: Inpatient  Remains inpatient appropriate because:Hemodynamically unstable, Ongoing diagnostic testing needed not appropriate for outpatient work up, Unsafe d/c plan and IV treatments appropriate due to intensity of illness or inability to take  PO  Dispo:  Patient From: Home  Planned Disposition: Home with Health Care Svc  Expected discharge date: 06/09/20  Medically stable for discharge: No   Consultants:   Nephrology  Hematology  Procedures:      Antimicrobials: Anti-infectives (From admission, onward)   None      Subjective: Without complaints. Pt reports LE edema seems to be improving  Objective: Vitals:   06/05/20 1351 06/05/20 2042 06/06/20 0500 06/06/20 0513  BP: 139/77 131/74  (!) 120/58  Pulse: 77 75  74  Resp: 18 16    Temp: 98.7 F (37.1 C) 99.1 F (37.3 C)  98.1 F (36.7 C)  TempSrc:  Oral  Oral  SpO2: 100% 100%  100%  Weight:   (!) 143.4 kg   Height:        Intake/Output Summary (Last 24 hours) at 06/06/2020 1545 Last data filed at 06/06/2020 1202 Gross per 24 hour  Intake 607 ml  Output 3400 ml  Net -2793 ml   Filed Weights   06/04/20 2219 06/05/20 0500 06/06/20 0500  Weight: (!) 145.3 kg (!) 144.3 kg (!) 143.4 kg    Examination: General exam: Conversant, in no acute distress Respiratory system: normal chest rise, clear, no audible wheezing Cardiovascular system: regular rhythm, s1-s2 Gastrointestinal system: Nondistended, nontender, pos BS Central nervous system: No seizures, no tremors Extremities: No cyanosis, no joint deformities, BLE edema improving Skin: No rashes, no pallor Psychiatry: Affect normal // no auditory hallucinations   Data Reviewed: I have personally reviewed following labs and imaging studies  CBC: Recent Labs  Lab 05/31/20 1534 06/01/20 0423 06/03/20 0100 06/04/20 0503 06/05/20 0532  WBC 3.8* 2.9* 2.9* 5.3 6.0  NEUTROABS 2.5 1.8  --   --   --   HGB 7.9* 7.5* 7.7* 7.4* 7.2*  HCT 21.8* 20.7* 20.9* 20.4* 19.7*  MCV 93.6 95.0 93.3 93.6 93.4  PLT 134* 136* 128* 134* 130*   Basic Metabolic Panel: Recent Labs  Lab 05/31/20 1923 06/01/20 0153 06/01/20 0423 06/01/20 0700 06/03/20 0100 06/03/20 1506 06/04/20 0503 06/05/20 0532 06/06/20 0442  NA 123*   < > 120*   < > 125* 123* 123* 125* 126*  K 3.6   < > 3.3*   < > 3.8 3.6 3.6 4.1 3.9  CL 89*   < > 89*   < > 91* 90* 91* 91* 93*  CO2 15*   < > 16*   < > 18* 20* 20* 21* 23  GLUCOSE 67*   < > 80   < >  146* 172* 132* 123* 126*  BUN 48*   < > 52*   < > 57* 60* 63* 71* 78*  CREATININE 4.32*   < > 4.29*   < > 3.76* 3.62* 3.48* 3.28* 3.25*  CALCIUM 8.4*   < > 8.1*   < > 8.9 8.8* 8.6* 9.0 9.1  MG 1.6*  --  1.9  --   --   --   --   --   --   PHOS 6.2*  --  5.8*  --   --   --   --   --   --    < > = values in this interval not displayed.   GFR: Estimated Creatinine Clearance: 23.7 mL/min (A) (by C-G formula based on SCr of 3.25 mg/dL (H)). Liver Function Tests: Recent Labs  Lab 05/31/20 1534 06/01/20 0423  AST 48* 40  ALT 28 24  ALKPHOS 94 87  BILITOT 0.9  1.3*  PROT 6.9 6.6  ALBUMIN 2.7* 3.0*   No results for input(s): LIPASE, AMYLASE in the last 168 hours. Recent Labs  Lab 05/31/20 1923  AMMONIA 39*   Coagulation Profile: Recent Labs  Lab 05/31/20 1534  INR 1.2   Cardiac Enzymes: No results for input(s): CKTOTAL, CKMB, CKMBINDEX, TROPONINI in the last 168 hours. BNP (last 3 results) No results for input(s): PROBNP in the last 8760 hours. HbA1C: No results for input(s): HGBA1C in the last 72 hours. CBG: Recent Labs  Lab 05/31/20 2042 05/31/20 2245 06/01/20 0150  GLUCAP 68* 77 78   Lipid Profile: No results for input(s): CHOL, HDL, LDLCALC, TRIG, CHOLHDL, LDLDIRECT in the last 72 hours. Thyroid Function Tests: No results for input(s): TSH, T4TOTAL, FREET4, T3FREE, THYROIDAB in the last 72 hours. Anemia Panel: No results for input(s): VITAMINB12, FOLATE, FERRITIN, TIBC, IRON, RETICCTPCT in the last 72 hours. Sepsis Labs: Recent Labs  Lab 05/31/20 1923  LATICACIDVEN 1.0    Recent Results (from the past 240 hour(s))  Respiratory Panel by RT PCR (Flu A&B, Covid) - Nasopharyngeal Swab     Status: None   Collection Time: 05/31/20  7:23 PM   Specimen: Nasopharyngeal Swab  Result Value Ref Range Status   SARS Coronavirus 2 by RT PCR NEGATIVE NEGATIVE Final    Comment: (NOTE) SARS-CoV-2 target nucleic acids are NOT DETECTED.  The SARS-CoV-2 RNA is generally  detectable in upper respiratoy specimens during the acute phase of infection. The lowest concentration of SARS-CoV-2 viral copies this assay can detect is 131 copies/mL. A negative result does not preclude SARS-Cov-2 infection and should not be used as the sole basis for treatment or other patient management decisions. A negative result may occur with  improper specimen collection/handling, submission of specimen other than nasopharyngeal swab, presence of viral mutation(s) within the areas targeted by this assay, and inadequate number of viral copies (<131 copies/mL). A negative result must be combined with clinical observations, patient history, and epidemiological information. The expected result is Negative.  Fact Sheet for Patients:  PinkCheek.be  Fact Sheet for Healthcare Providers:  GravelBags.it  This test is no t yet approved or cleared by the [place] FDA and  has been authorized for detection and/or diagnosis of SARS-CoV-2 by FDA under an Emergency Use Authorization (EUA). This EUA will remain  in effect (meaning this test can be used) for the duration of the COVID-19 declaration under Section 564(b)(1) of the Act, 21 U.S.C. section 360bbb-3(b)(1), unless the authorization is terminated or revoked sooner.     Influenza A by PCR NEGATIVE NEGATIVE Final   Influenza B by PCR NEGATIVE NEGATIVE Final    Comment: (NOTE) The Xpert Xpress SARS-CoV-2/FLU/RSV assay is intended as an aid in  the diagnosis of influenza from Nasopharyngeal swab specimens and  should not be used as a sole basis for treatment. Nasal washings and  aspirates are unacceptable for Xpert Xpress SARS-CoV-2/FLU/RSV  testing.  Fact Sheet for Patients: PinkCheek.be  Fact Sheet for Healthcare Providers: GravelBags.it  This test is not yet approved or cleared by the [place] FDA and   has been authorized for detection and/or diagnosis of SARS-CoV-2 by  FDA under an Emergency Use Authorization (EUA). This EUA will remain  in effect (meaning this test can be used) for the duration of the  Covid-19 declaration under Section 564(b)(1) of the Act, 21  U.S.C. section 360bbb-3(b)(1), unless the authorization is  terminated or revoked. Performed at [facility], [place]  [address], [place], [place] [address]      Radiology Studies: No results found.  Scheduled Meds: . anastrozole  1 mg Oral Daily  . Chlorhexidine Gluconate Cloth  6 each Topical Daily  . dexamethasone  4 mg Oral Daily  . docusate sodium  100 mg Oral BID  . feeding supplement (ENSURE ENLIVE)  237 mL Oral BID BM  . feeding supplement (KATE FARMS STANDARD 1.4)  325 mL Oral BID BM  . folic acid  1 mg Oral Daily  . gabapentin  100 mg Oral BID  . heparin  5,000 Units Subcutaneous Q8H  . metolazone  10 mg Oral Daily  . mineral oil-hydrophilic petrolatum   Topical BID  . multivitamin with minerals  1 tablet Oral Daily  . nystatin   Topical TID  . sodium chloride flush  3 mL Intravenous Q12H  . sodium chloride flush  3 mL Intravenous Q12H  . thiamine  100 mg Oral Daily   Or  . thiamine  100 mg Intravenous Daily   Continuous Infusions: . sodium chloride    . furosemide 160 mg (06/06/20 1034)     LOS: 6 days   Marylu Lund, MD [facility] Pager On Amion  If 7PM-7AM, please contact night-coverage 06/06/2020, 3:45 PM

## 2020-06-06 NOTE — TOC Progression Note (Signed)
Transition of Care [facility]) - Progression Note    Patient Details  Name: [name] MRN: [id number] Date of Birth: [birth year]  Transition of Care [facility]) CM/SW [place], [place] Phone Number: 06/06/2020, 12:28 PM  Clinical Narrative:   Madilyn Hook with [facility] confirmed ability to provide [facility] RN, PT for patient at d/c.Services will commence on Wednesday, 10/13. Start date noted on her AVS. TOC will continue to follow during the course of hospitalization.     Expected Discharge Plan: [place] Barriers to Discharge: Continued Medical Work up  Expected Discharge Plan and Services Expected Discharge Plan: [place] In-house Referral: Clinical Social Work Discharge Planning Services: CM Consult Post Acute Care Choice: [place] arrangements for the past 2 months: Single Family Home                           HH Arranged: PT, RN           Social Determinants of Health (SDOH) Interventions    Readmission Risk Interventions Readmission Risk Prevention Plan 03/06/2020 02/13/2020  Transportation Screening Complete Complete  PCP or Specialist Appt within 3-5 Days Not Complete -  Not Complete comments Unsure of dc date at this time -  [place] or [place] Complete -  Social Work Consult for [place] Planning/Counseling Complete -  Palliative Care Screening Not Applicable -  Medication Review Press photographer) Complete Complete  PCP or Specialist appointment within 3-5 days of discharge - Complete  HRI or [place] - Not Applicable  Some recent data might be hidden

## 2020-06-06 NOTE — Progress Notes (Addendum)
[facility] Progress Note  Subjective: creat down 2.8 and Na+ up 131 today. 3.2 L uop.   Vitals:   06/05/20 1351 06/05/20 2042 06/06/20 0500 06/06/20 0513  BP: 139/77 131/74  (!) 120/58  Pulse: 77 75  74  Resp: 18 16    Temp: 98.7 F (37.1 C) 99.1 F (37.3 C)  98.1 F (36.7 C)  TempSrc:  Oral  Oral  SpO2: 100% 100%  100%  Weight:   (!) 143.4 kg   Height:        Exam:  alert, nad , morbidly obese  no jvd  Chest rales L base, poor resp effort  Cor reg no RG  Abd soft ntnd no ascites, 2-3+ pannus edema   Ext 2+ hip and LE edema bilat   Alert, NF, ox3, deconditioned      Date   Creat   eGFR   2009- 2018  0.67- 1.00   2020   0.66- 1.30   10/2019  0.70   10/2019  0.66   11/2019  1.26- 2.40   01/02/2020  1.48   5/25- 01/28/20  6.02 >> 1.98  AKI episode    01/2020  1.2- 1.7  35- 55 ml/min     02/2020  0.80- 1.67  39- > 60 ml/min     03/2020  1.20   04/2020  1.07- 2.77   Oct 2- 3, 2021 4.49 >> 4.19         Renal US >>   11- 13 cm kidneys w/o hydro and normal echo, L kidney not well visualized due to body habitus     UA  10/2 >> 100 prot, 0-5 rbc/ wbc, many bact      Serum Osm 270-274      UNa < 10 on 10/2, Uosm 198      UCr 176 on 10/2       CXR 10/2 - read as vasc congestion, but appears normal to me     ECHO last done June 2021 - LVEF 60-70%, G1DD   Assessment/ Plan: 1. AoCKD 3a - b/l creat 1.2- 1.7, eGFR 35- 55 ml/min. Suspect AKI due to nsaids/ diast HF/ hemodynamic. BP's high w/ sig vol overload/ anasarca, low UNa on admission. No obstruction on [place]. CXR clear from 10/02. UPC ratio 573m and UA negative. Creat down 4.2 > 3.7 > 3.4 > 3.2 > 2.8 today. Renal fxn improving. Still has 20+ kg of edema and will likely be here a while longer.  2. Hyponatremia - hypervolemic w/ edematous state. Na+ up to 132.  3. Hx breast cancer 4. Anemia/ pancytopenia - not sure cause, hx prior chemoRx.  Get myeloma studies.  5. HTN - bp's down yest, coreg dc'd, BP's  better  6.  Morbid obesity - BMI 60.  Obesity is driving all of this including diast HF/ vol overload and renal failure.  IF she would be a candidate she should have wt loss surgery, have d/w pt and pmd.      Rob Aleli Navedo 06/06/2020, 5:11 PM   Recent Labs  Lab 05/31/20 1923 06/01/20 0153 06/01/20 0423 06/01/20 0700 06/04/20 0503 06/04/20 0503 06/05/20 0532 06/06/20 0442  K 3.6   < > 3.3*   < > 3.6   < > 4.1 3.9  BUN 48*   < > 52*   < > 63*   < > 71* 78*  CREATININE 4.32*   < > 4.29*   < > 3.48*   < >  3.28* 3.25*  CALCIUM 8.4*   < > 8.1*   < > 8.6*   < > 9.0 9.1  PHOS 6.2*  --  5.8*  --   --   --   --   --   HGB  --   --  7.5*   < > 7.4*  --  7.2*  --    < > = values in this interval not displayed.   Inpatient medications: . anastrozole  1 mg Oral Daily  . Chlorhexidine Gluconate Cloth  6 each Topical Daily  . dexamethasone  4 mg Oral Daily  . docusate sodium  100 mg Oral BID  . feeding supplement (ENSURE ENLIVE)  237 mL Oral BID BM  . feeding supplement (KATE FARMS STANDARD 1.4)  325 mL Oral BID BM  . folic acid  1 mg Oral Daily  . gabapentin  100 mg Oral BID  . heparin  5,000 Units Subcutaneous Q8H  . metolazone  10 mg Oral Daily  . mineral oil-hydrophilic petrolatum   Topical BID  . multivitamin with minerals  1 tablet Oral Daily  . nystatin   Topical TID  . sodium chloride flush  3 mL Intravenous Q12H  . sodium chloride flush  3 mL Intravenous Q12H  . thiamine  100 mg Oral Daily   Or  . thiamine  100 mg Intravenous Daily   . sodium chloride    . furosemide 160 mg (06/06/20 1034)   sodium chloride, acetaminophen **OR** acetaminophen, HYDROcodone-acetaminophen, ondansetron **OR** ondansetron (ZOFRAN) IV, polyethylene glycol, sodium chloride flush

## 2020-06-06 NOTE — TOC Progression Note (Signed)
Transition of Care [facility]) - Progression Note    Patient Details  Name: [name] MRN: [id number] Date of Birth: 06-10-1956  Transition [facility] Care [facility]) CM/SW Contact  Grier Vu, Juliann Pulse, RN Phone Number: 06/06/2020, 11:34 AM  Clinical Narrative: Noted HF clinic otpt f/u. [facility] community following.Left vm with Managed medicaid rep for asst with HHPT/RN if a service that can be provided since no [place] agency to accept per prior note.      Expected Discharge Plan: [place] Barriers to Discharge: Continued Medical Work up  Expected Discharge Plan and Services Expected Discharge Plan: [place] In-house Referral: Clinical Social Work Discharge Planning Services: CM Consult Post Acute Care Choice: [place] arrangements for the past 2 months: Single Family Home                           HH Arranged: PT, RN           Social Determinants of Health (SDOH) Interventions    Readmission Risk Interventions Readmission Risk Prevention Plan 03/06/2020 02/13/2020  Transportation Screening Complete Complete  PCP or Specialist Appt within 3-5 Days Not Complete -  Not Complete comments Unsure of dc date at this time -  [place] or [place] Complete -  Social Work Consult for [place] Planning/Counseling Complete -  Palliative Care Screening Not Applicable -  Medication Review Press photographer) Complete Complete  PCP or Specialist appointment within 3-5 days of discharge - Complete  HRI or [place] - Not Applicable  Some recent data might be hidden

## 2020-06-06 NOTE — Progress Notes (Signed)
Physical Therapy Treatment Patient Details Name: [name] MRN: [id number] DOB: August 20, 1956 Today's Date: 06/06/2020    History of Present Illness 64 y.o. female with medical history significant of breast CA sp Mastectomy and radiation on the left, morbid obesity, HTN, multiple skin ulcers, Alcohol liver diseaseCKD stage IIIa, Grade 1 diastolic CHF.  Presented with bilateral leg swelling and burning. Most recent radiation session was 3 days PTA. Dx of hyponatremia    PT Comments    Pt familiar from prior admits and actually looks good and with some weight loss.  Assisted OOB.  General bed mobility comments: assist to raise trunk, used pad to pivot hips to edge of bed.  General transfer comment: pt self able to rise from bed using forward momentum/forward weight shift and use of walker.  Assisted from bed to recliner, pt was able to take a few side steps and weight shift to complete 1/4 turn.  Then after in recliner a few min, pt requested to use BSC.  So same fashion assisted from recliner to [facility] and back to recliner when finished. "that wore me out".  Pt did well transferring 4 times.  Pt plans to return home.    Follow Up Recommendations  Supervision for mobility/OOB;Supervision/Assistance - 24 hour;Home health PT     Equipment Recommendations  None recommended by PT    Recommendations for Other Services       Precautions / Restrictions Precautions Precautions: Fall Restrictions Weight Bearing Restrictions: No    Mobility  Bed Mobility Overal bed mobility: Needs Assistance Bed Mobility: Supine to Sit     Supine to sit: Mod assist;Max assist     General bed mobility comments: assist to raise trunk, used pad to pivot hips to edge of bed  Transfers Overall transfer level: Needs assistance Equipment used: Rolling walker (2 wheeled) Transfers: Sit to/from [facility] Sit to Stand: Min guard Stand pivot transfers: Min guard       General transfer  comment: pt self able to rise from bed using forward momentum/forward weight shift and use of walker.  Assisted from bed to recliner, pt was able to take a few side steps and weight shift to complete 1/4 turn.  Then after in recliner a few min, pt requested to use BSC.  So same fashion assisted from recliner to [facility] and back to recliner when finished.  Ambulation/Gait             General Gait Details: transfers only this session   Stairs             Wheelchair Mobility    Modified Rankin (Stroke Patients Only)       Balance                                            Cognition Arousal/Alertness: Awake/alert Behavior During Therapy: WFL for tasks assessed/performed Overall Cognitive Status: Within Functional Limits for tasks assessed                                 General Comments: AxO x 3 pleasant, familiar from prior admits      Exercises      General Comments        Pertinent Vitals/Pain Pain Assessment: No/denies pain    Home Living  Prior Function            PT Goals (current goals can now be found in the care plan section) Progress towards PT goals: Progressing toward goals    Frequency    Min 3X/week      PT Plan Current plan remains appropriate    Co-evaluation              AM-PAC PT "6 Clicks" Mobility   Outcome Measure  Help needed turning from your back to your side while in a flat bed without using bedrails?: A Lot Help needed moving from lying on your back to sitting on the side of a flat bed without using bedrails?: A Lot Help needed moving to and from a bed to a chair (including a wheelchair)?: A Little Help needed standing up from a chair using your arms (e.g., wheelchair or bedside chair)?: A Little Help needed to walk in hospital room?: A Little Help needed climbing 3-5 steps with a railing? : A Little 6 Click Score: 16    End of Session Equipment  Utilized During Treatment: Gait belt Activity Tolerance: Patient tolerated treatment well Patient left: in chair;with call bell/phone within reach;with chair alarm set Nurse Communication: Mobility status PT Visit Diagnosis: Difficulty in walking, not elsewhere classified (R26.2);History of falling (Z91.81)     Time: 2248-2500 PT Time Calculation (min) (ACUTE ONLY): 45 min  Charges:  $Therapeutic Activity: 38-52 mins                     Rica Koyanagi  PTA Acute  Rehabilitation Services Pager      [phone] Office      [phone]

## 2020-06-07 DIAGNOSIS — N179 Acute kidney failure, unspecified: Secondary | ICD-10-CM | POA: Diagnosis not present

## 2020-06-07 DIAGNOSIS — D638 Anemia in other chronic diseases classified elsewhere: Secondary | ICD-10-CM | POA: Diagnosis not present

## 2020-06-07 LAB — BASIC METABOLIC PANEL
Anion gap: 11 (ref 5–15)
BUN: 80 mg/dL — ABNORMAL HIGH (ref 8–23)
CO2: 27 mmol/L (ref 22–32)
Calcium: 9.7 mg/dL (ref 8.9–10.3)
Chloride: 93 mmol/L — ABNORMAL LOW (ref 98–111)
Creatinine, Ser: 2.8 mg/dL — ABNORMAL HIGH (ref 0.44–1.00)
GFR, Estimated: 17 mL/min — ABNORMAL LOW (ref >60)
Glucose, Bld: 125 mg/dL — ABNORMAL HIGH (ref 70–99)
Potassium: 3.9 mmol/L (ref 3.5–5.1)
Sodium: 131 mmol/L — ABNORMAL LOW (ref 135–145)

## 2020-06-07 NOTE — Progress Notes (Signed)
PROGRESS NOTE    [name]  RKY:[id number] DOB: Oct 26, 1955 DOA: 05/31/2020 PCP: [name], MD    Brief Narrative:  64 y.o.femalewith medical history significant of breast CA sp Mastectomy and radiation on the left,morbid obesity, HTN, multiple skin ulcers, Alcohol liver disease CKD stage IIIa, Grade 1 diastolic CHF.  Presented withbilateral leg swelling and burning on the bottom.  She has been urinating a bit more than usual.  Reports she drinks 4 bottle of water a day and ensure.  She has no appetite and has not been eating.  She gained fluid weight.  Reports her ulcers have been healing.  She can walk a bit uses wheelchair.  She still drinks few mixed drinks a day 2-3/day no hx of withdraw.  Last left breast radiation therapy was 3 days ago  Reports feet are burning.  Hurts to stand up.  No diarrhea no melena no blood in stool.  Work up revealed No evidence of DVT, Hyponatremia and acute on chronic CKD3B.  TRH was asked to admit.  Nephrology was consulted and is following.  Assessment & Plan:   Active Problems:   Essential hypertension   Alcohol dependence ([place])   Edema   Malignant neoplasm of upper-outer quadrant of left breast in female, estrogen receptor positive ([place])   Morbid obesity with BMI of 60.0-69.9, adult (HCC)   Acute renal failure (ARF) (HCC)   Thrombocytopenia (HCC)   Debility   Anemia of chronic disease   Chronic kidney disease, stage 3a (HCC)   Hyponatremia   Pancytopenia (HCC)   Breast cancer (HCC)  Hypervolemic hyponatremia Presented with serum sodium 119, low serum osmolality, hypervolemic on exam Pt is continued on lasix gtt q8hrs with metolazone added 10/8 with resultant excellent diuresis and much improved sodium Recheck bmet in AM  Improving, nonoliguric AKI on CKD 3B Baseline creatinine noted to be around 2.7 Presented with creatinine of greater than 4 with GFR of 12 Creatinine now much improved at 2.8 Continue to avoid nephrotoxins and  hypotension Continue diuresis as per nephrology with lasix and metolazone. Repeat bmet in AM  Anemia of chronic disease secondary to CKD No overt bleeding Most recent Hgb 7.2 Repeat cbc in AM Discussed with Oncology. Pt may benefit from GI follow up on d/c Iron level normal at 130  Leukopenia/thrombocytopenia likely secondary to acute illness WBC normalized Repeat cbc in AM  Resolved post repletion: Hypokalemia Corrected Recheck bmet in AM  Isolated hyperbilirubinemia T bili 1.3 on 06/01/2020 Suspect element of hepatic congestion related to below acute CHF  Acute on Chronic diastolic CHF Last 2D echo done on 02/11/2020 showed LVEF 70 to 75% and grade 1 diastolic dysfunction Repeat 2D echo reviewed. Findings of EF 31-51%VOHY grade 2 diastolic dysfunction Continue strict I/O's Recently discussed case with Cardiology who agrees with plan for diuresis above. Cardiology recommends establishing follow up with Cardiology when pt is ultimately discharged  Physical debility PT recs for supervision for mobility/oob, HHPT Fall precautions  History of breast cancer/radiation Last radiation 3 days prior to presentation affecting left breast, Nodes noted on exam, POA Wound care specialist for local wound care recommendations Continue local wound care as toleraetd  Severe morbid obesity BMI 60.11 Recommend diet/lifestyle modification Will benefit from referral to bariatric weight loss center  DVT prophylaxis: Heparin subq Code Status: Full Family Communication: Pt in room, family not at bedside  Status is: Inpatient  Remains inpatient appropriate because:Hemodynamically unstable, Ongoing diagnostic testing needed not appropriate for outpatient work up, Unsafe d/c  plan and IV treatments appropriate due to intensity of illness or inability to take PO  Dispo:  Patient From: Home  Planned Disposition: Home with Health Care Svc  Expected discharge date: 06/09/20  Medically  stable for discharge: No   Consultants:   Nephrology  Hematology  Procedures:     Antimicrobials: Anti-infectives (From admission, onward)   None      Subjective: Reports feeling better today. States swelling is improving  Objective: Vitals:   06/07/20 0514 06/07/20 0630 06/07/20 1343 06/07/20 1344  BP: 132/72  119/70 119/70  Pulse: 74  67 67  Resp: 15     Temp: 97.6 F (36.4 C)  (!) 97.5 F (36.4 C) (!) 97.5 F (36.4 C)  TempSrc: Oral  Oral Oral  SpO2: 95%   98%  Weight:  (!) 144.3 kg    Height:        Intake/Output Summary (Last 24 hours) at 06/07/2020 1653 Last data filed at 06/07/2020 1200 Gross per 24 hour  Intake 240 ml  Output 3350 ml  Net -3110 ml   Filed Weights   06/05/20 0500 06/06/20 0500 06/07/20 0630  Weight: (!) 144.3 kg (!) 143.4 kg (!) 144.3 kg    Examination: General exam: Awake, laying in bed, in nad Respiratory system: Normal respiratory effort, no wheezing Cardiovascular system: regular rate, s1, s2 Gastrointestinal system: Soft, nondistended, positive BS Central nervous system: CN2-12 grossly intact, strength intact Extremities: Perfused, no clubbing, BLE edematous but improving Skin: Normal skin turgor, no notable skin lesions seen Psychiatry: Mood normal // no visual hallucinations   Data Reviewed: I have personally reviewed following labs and imaging studies  CBC: Recent Labs  Lab 06/01/20 0423 06/03/20 0100 06/04/20 0503 06/05/20 0532  WBC 2.9* 2.9* 5.3 6.0  NEUTROABS 1.8  --   --   --   HGB 7.5* 7.7* 7.4* 7.2*  HCT 20.7* 20.9* 20.4* 19.7*  MCV 95.0 93.3 93.6 93.4  PLT 136* 128* 134* 952*   Basic Metabolic Panel: Recent Labs  Lab 05/31/20 1923 06/01/20 0153 06/01/20 0423 06/01/20 0700 06/03/20 1506 06/04/20 0503 06/05/20 0532 06/06/20 0442 06/07/20 0608  NA 123*   < > 120*   < > 123* 123* 125* 126* 131*  K 3.6   < > 3.3*   < > 3.6 3.6 4.1 3.9 3.9  CL 89*   < > 89*   < > 90* 91* 91* 93* 93*  CO2 15*   < >  16*   < > 20* 20* 21* 23 27  GLUCOSE 67*   < > 80   < > 172* 132* 123* 126* 125*  BUN 48*   < > 52*   < > 60* 63* 71* 78* 80*  CREATININE 4.32*   < > 4.29*   < > 3.62* 3.48* 3.28* 3.25* 2.80*  CALCIUM 8.4*   < > 8.1*   < > 8.8* 8.6* 9.0 9.1 9.7  MG 1.6*  --  1.9  --   --   --   --   --   --   PHOS 6.2*  --  5.8*  --   --   --   --   --   --    < > = values in this interval not displayed.   GFR: Estimated Creatinine Clearance: 27.7 mL/min (A) (by C-G formula based on SCr of 2.8 mg/dL (H)). Liver Function Tests: Recent Labs  Lab 06/01/20 0423  AST 40  ALT 24  ALKPHOS  87  BILITOT 1.3*  PROT 6.6  ALBUMIN 3.0*   No results for input(s): LIPASE, AMYLASE in the last 168 hours. Recent Labs  Lab 05/31/20 1923  AMMONIA 39*   Coagulation Profile: No results for input(s): INR, PROTIME in the last 168 hours. Cardiac Enzymes: No results for input(s): CKTOTAL, CKMB, CKMBINDEX, TROPONINI in the last 168 hours. BNP (last 3 results) No results for input(s): PROBNP in the last 8760 hours. HbA1C: No results for input(s): HGBA1C in the last 72 hours. CBG: Recent Labs  Lab 05/31/20 2042 05/31/20 2245 06/01/20 0150  GLUCAP 68* 77 78   Lipid Profile: No results for input(s): CHOL, HDL, LDLCALC, TRIG, CHOLHDL, LDLDIRECT in the last 72 hours. Thyroid Function Tests: No results for input(s): TSH, T4TOTAL, FREET4, T3FREE, THYROIDAB in the last 72 hours. Anemia Panel: No results for input(s): VITAMINB12, FOLATE, FERRITIN, TIBC, IRON, RETICCTPCT in the last 72 hours. Sepsis Labs: Recent Labs  Lab 05/31/20 1923  LATICACIDVEN 1.0    Recent Results (from the past 240 hour(s))  Respiratory Panel by RT PCR (Flu A&B, Covid) - Nasopharyngeal Swab     Status: None   Collection Time: 05/31/20  7:23 PM   Specimen: Nasopharyngeal Swab  Result Value Ref Range Status   SARS Coronavirus 2 by RT PCR NEGATIVE NEGATIVE Final    Comment: (NOTE) SARS-CoV-2 target nucleic acids are NOT DETECTED.  The  SARS-CoV-2 RNA is generally detectable in upper respiratoy specimens during the acute phase of infection. The lowest concentration of SARS-CoV-2 viral copies this assay can detect is 131 copies/mL. A negative result does not preclude SARS-Cov-2 infection and should not be used as the sole basis for treatment or other patient management decisions. A negative result may occur with  improper specimen collection/handling, submission of specimen other than nasopharyngeal swab, presence of viral mutation(s) within the areas targeted by this assay, and inadequate number of viral copies (<131 copies/mL). A negative result must be combined with clinical observations, patient history, and epidemiological information. The expected result is Negative.  Fact Sheet for Patients:  PinkCheek.be  Fact Sheet for Healthcare Providers:  GravelBags.it  This test is no t yet approved or cleared by the [place] FDA and  has been authorized for detection and/or diagnosis of SARS-CoV-2 by FDA under an Emergency Use Authorization (EUA). This EUA will remain  in effect (meaning this test can be used) for the duration of the COVID-19 declaration under Section 564(b)(1) of the Act, 21 U.S.C. section 360bbb-3(b)(1), unless the authorization is terminated or revoked sooner.     Influenza A by PCR NEGATIVE NEGATIVE Final   Influenza B by PCR NEGATIVE NEGATIVE Final    Comment: (NOTE) The Xpert Xpress SARS-CoV-2/FLU/RSV assay is intended as an aid in  the diagnosis of influenza from Nasopharyngeal swab specimens and  should not be used as a sole basis for treatment. Nasal washings and  aspirates are unacceptable for Xpert Xpress SARS-CoV-2/FLU/RSV  testing.  Fact Sheet for Patients: PinkCheek.be  Fact Sheet for Healthcare Providers: GravelBags.it  This test is not yet approved or cleared  by the [place] FDA and  has been authorized for detection and/or diagnosis of SARS-CoV-2 by  FDA under an Emergency Use Authorization (EUA). This EUA will remain  in effect (meaning this test can be used) for the duration of the  Covid-19 declaration under Section 564(b)(1) of the Act, 21  U.S.C. section 360bbb-3(b)(1), unless the authorization is  terminated or revoked. Performed at [facility], [address] [place], [place] [address]      Radiology Studies: No results found.  Scheduled Meds: . anastrozole  1 mg Oral Daily  . Chlorhexidine Gluconate Cloth  6 each Topical Daily  . dexamethasone  4 mg Oral Daily  . docusate sodium  100 mg Oral BID  . feeding supplement (ENSURE ENLIVE)  237 mL Oral BID BM  . feeding supplement (KATE FARMS STANDARD 1.4)  325 mL Oral BID BM  . folic acid  1 mg Oral Daily  . gabapentin  100 mg Oral BID  . heparin  5,000 Units Subcutaneous Q8H  . metolazone  10 mg Oral Daily  . mineral oil-hydrophilic petrolatum   Topical BID  . multivitamin with minerals  1 tablet Oral Daily  . nystatin   Topical TID  . sodium chloride flush  3 mL Intravenous Q12H  . thiamine  100 mg Oral Daily   Or  . thiamine  100 mg Intravenous Daily   Continuous Infusions: . furosemide 160 mg (06/07/20 1130)     LOS: 7 days   Marylu Lund, MD [facility] Pager On Amion  If 7PM-7AM, please contact night-coverage 06/07/2020, 4:53 PM

## 2020-06-08 DIAGNOSIS — N179 Acute kidney failure, unspecified: Secondary | ICD-10-CM | POA: Diagnosis not present

## 2020-06-08 DIAGNOSIS — D638 Anemia in other chronic diseases classified elsewhere: Secondary | ICD-10-CM | POA: Diagnosis not present

## 2020-06-08 LAB — BASIC METABOLIC PANEL
Anion gap: 13 (ref 5–15)
BUN: 81 mg/dL — ABNORMAL HIGH (ref 8–23)
CO2: 27 mmol/L (ref 22–32)
Calcium: 9.4 mg/dL (ref 8.9–10.3)
Chloride: 89 mmol/L — ABNORMAL LOW (ref 98–111)
Creatinine, Ser: 2.58 mg/dL — ABNORMAL HIGH (ref 0.44–1.00)
GFR, Estimated: 19 mL/min — ABNORMAL LOW (ref >60)
Glucose, Bld: 121 mg/dL — ABNORMAL HIGH (ref 70–99)
Potassium: 3.7 mmol/L (ref 3.5–5.1)
Sodium: 129 mmol/L — ABNORMAL LOW (ref 135–145)

## 2020-06-08 LAB — CBC
HCT: 20.8 % — ABNORMAL LOW (ref 36.0–46.0)
Hemoglobin: 7.1 g/dL — ABNORMAL LOW (ref 12.0–15.0)
MCH: 32.9 pg (ref 26.0–34.0)
MCHC: 34.1 g/dL (ref 30.0–36.0)
MCV: 96.3 fL (ref 80.0–100.0)
Platelets: 179 10*3/uL (ref 150–400)
RBC: 2.16 MIL/uL — ABNORMAL LOW (ref 3.87–5.11)
RDW: 15.1 % (ref 11.5–15.5)
WBC: 6 10*3/uL (ref 4.0–10.5)
nRBC: 0 % (ref 0.0–0.2)

## 2020-06-08 MED ORDER — FUROSEMIDE 10 MG/ML IJ SOLN
160.0000 mg | Freq: Three times a day (TID) | INTRAVENOUS | Status: DC
  Administered 2020-06-08 – 2020-06-09 (×3): 160 mg via INTRAVENOUS
  Filled 2020-06-08 (×4): qty 16

## 2020-06-08 MED ORDER — FUROSEMIDE 10 MG/ML IJ SOLN
120.0000 mg | Freq: Three times a day (TID) | INTRAVENOUS | Status: DC
  Administered 2020-06-08: 120 mg via INTRAVENOUS
  Filled 2020-06-08: qty 10
  Filled 2020-06-08: qty 12

## 2020-06-08 NOTE — Progress Notes (Signed)
PROGRESS NOTE    [name]  EZM:[id number] DOB: [birth year] DOA: 05/31/2020 PCP: [name], MD    Brief Narrative:  64 y.o.femalewith medical history significant of breast CA sp Mastectomy and radiation on the left,morbid obesity, HTN, multiple skin ulcers, Alcohol liver disease CKD stage IIIa, Grade 1 diastolic CHF.  Presented withbilateral leg swelling and burning on the bottom.  She has been urinating a bit more than usual.  Reports she drinks 4 bottle of water a day and ensure.  She has no appetite and has not been eating.  She gained fluid weight.  Reports her ulcers have been healing.  She can walk a bit uses wheelchair.  She still drinks few mixed drinks a day 2-3/day no hx of withdraw.  Last left breast radiation therapy was 3 days ago  Reports feet are burning.  Hurts to stand up.  No diarrhea no melena no blood in stool.  Work up revealed No evidence of DVT, Hyponatremia and acute on chronic CKD3B.  TRH was asked to admit.  Nephrology was consulted and is following.  Assessment & Plan:   Active Problems:   Essential hypertension   Alcohol dependence ([place])   Edema   Malignant neoplasm of upper-outer quadrant of left breast in female, estrogen receptor positive ()   Morbid obesity with BMI of 60.0-69.9, adult (HCC)   Acute renal failure (ARF) (HCC)   Thrombocytopenia (HCC)   Debility   Anemia of chronic disease   Chronic kidney disease, stage 3a (HCC)   Hyponatremia   Pancytopenia (HCC)   Breast cancer (HCC)  Hypervolemic hyponatremia Presented with serum sodium 119, low serum osmolality, hypervolemic on exam Pt is continued on lasix gtt q8hrs with metolazone added 10/8  Good diuresis thus far with improving renal function  Sodium currently 129 Recheck bmet in AM  Improving, nonoliguric AKI on CKD 3B Baseline creatinine noted to be around 2.7 Presented with creatinine of greater than 4 with GFR of 12 Creatinine now much improved at 2.58 Continue to  avoid nephrotoxins and hypotension Continue diuresis as per nephrology with lasix and metolazone. Even though pt is improving nicely, she is still quite volume overloaded, thus would likely require continued hospital stay Repeat bmet in AM  Anemia of chronic disease secondary to CKD No overt bleeding Most recent Hgb 7.2 Repeat cbc in AM Discussed with Oncology. Pt may benefit from GI follow up on d/c Iron level normal at 130  Leukopenia/thrombocytopenia likely secondary to acute illness WBC normal at 6 Repeat cbc in AM  Resolved post repletion: Hypokalemia Corrected Recheck bmet in AM  Isolated hyperbilirubinemia T bili 1.3 on 06/01/2020 Suspect element of hepatic congestion related to below acute CHF  Acute on Chronic diastolic CHF Last 2D echo done on 02/11/2020 showed LVEF 70 to 75% and grade 1 diastolic dysfunction Repeat 2D echo reviewed. Findings of EF 50-35%WSFK grade 2 diastolic dysfunction Continue strict I/O's Recently discussed case with Cardiology who agrees with plan for diuresis above. Cardiology recommends establishing follow up with Cardiology when pt is ultimately discharged  Physical debility PT recs for supervision for mobility/oob, HHPT Fall precautions  History of breast cancer/radiation Last radiation 3 days prior to presentation affecting left breast, Nodes noted on exam, POA Wound care specialist for local wound care recommendations Continue local wound care as toleraetd  Severe morbid obesity BMI 60.11 Recommend diet/lifestyle modification Will benefit from referral to bariatric weight loss center  DVT prophylaxis: Heparin subq Code Status: Full Family Communication: Pt in room,  family not at bedside  Status is: Inpatient  Remains inpatient appropriate because:Hemodynamically unstable, Ongoing diagnostic testing needed not appropriate for outpatient work up, Unsafe d/c plan and IV treatments appropriate due to intensity of illness or  inability to take PO  Dispo:  Patient From: Home  Planned Disposition: Home with Health Care Svc  Expected discharge date: 06/09/20  Medically stable for discharge: No   Consultants:   Nephrology  Hematology  Procedures:     Antimicrobials: Anti-infectives (From admission, onward)   None      Subjective: States feeling better today. Swelling improving  Objective: Vitals:   06/07/20 1344 06/07/20 2055 06/08/20 0521 06/08/20 1254  BP: 119/70 130/71 (!) 169/76 122/60  Pulse: 67 68 75 75  Resp:  20 15 16   Temp: (!) 97.5 F (36.4 C) 97.9 F (36.6 C) 98.4 F (36.9 C) 97.6 F (36.4 C)  TempSrc: Oral Oral Oral Oral  SpO2: 98% 100% 99% 100%  Weight:      Height:        Intake/Output Summary (Last 24 hours) at 06/08/2020 1541 Last data filed at 06/08/2020 1427 Gross per 24 hour  Intake 1730 ml  Output 4051 ml  Net -2321 ml   Filed Weights   06/05/20 0500 06/06/20 0500 06/07/20 0630  Weight: (!) 144.3 kg (!) 143.4 kg (!) 144.3 kg    Examination: General exam: Conversant, in no acute distress Respiratory system: normal chest rise, clear, no audible wheezing Cardiovascular system: regular rhythm, s1-s2 Gastrointestinal system: Nondistended, nontender, pos BS Central nervous system: No seizures, no tremors Extremities: No cyanosis, no joint deformities, BLE and UE edema is improving, still edematous though Skin: No rashes, no pallor Psychiatry: Affect normal // no auditory hallucinations    Data Reviewed: I have personally reviewed following labs and imaging studies  CBC: Recent Labs  Lab 06/03/20 0100 06/04/20 0503 06/05/20 0532 06/08/20 0547  WBC 2.9* 5.3 6.0 6.0  HGB 7.7* 7.4* 7.2* 7.1*  HCT 20.9* 20.4* 19.7* 20.8*  MCV 93.3 93.6 93.4 96.3  PLT 128* 134* 138* 242   Basic Metabolic Panel: Recent Labs  Lab 06/04/20 0503 06/05/20 0532 06/06/20 0442 06/07/20 0608 06/08/20 0547  NA 123* 125* 126* 131* 129*  K 3.6 4.1 3.9 3.9 3.7  CL 91* 91*  93* 93* 89*  CO2 20* 21* 23 27 27   GLUCOSE 132* 123* 126* 125* 121*  BUN 63* 71* 78* 80* 81*  CREATININE 3.48* 3.28* 3.25* 2.80* 2.58*  CALCIUM 8.6* 9.0 9.1 9.7 9.4   GFR: Estimated Creatinine Clearance: 30 mL/min (A) (by C-G formula based on SCr of 2.58 mg/dL (H)). Liver Function Tests: No results for input(s): AST, ALT, ALKPHOS, BILITOT, PROT, ALBUMIN in the last 168 hours. No results for input(s): LIPASE, AMYLASE in the last 168 hours. No results for input(s): AMMONIA in the last 168 hours. Coagulation Profile: No results for input(s): INR, PROTIME in the last 168 hours. Cardiac Enzymes: No results for input(s): CKTOTAL, CKMB, CKMBINDEX, TROPONINI in the last 168 hours. BNP (last 3 results) No results for input(s): PROBNP in the last 8760 hours. HbA1C: No results for input(s): HGBA1C in the last 72 hours. CBG: No results for input(s): GLUCAP in the last 168 hours. Lipid Profile: No results for input(s): CHOL, HDL, LDLCALC, TRIG, CHOLHDL, LDLDIRECT in the last 72 hours. Thyroid Function Tests: No results for input(s): TSH, T4TOTAL, FREET4, T3FREE, THYROIDAB in the last 72 hours. Anemia Panel: No results for input(s): VITAMINB12, FOLATE, FERRITIN, TIBC, IRON, RETICCTPCT in  the last 72 hours. Sepsis Labs: No results for input(s): PROCALCITON, LATICACIDVEN in the last 168 hours.  Recent Results (from the past 240 hour(s))  Respiratory Panel by RT PCR (Flu A&B, Covid) - Nasopharyngeal Swab     Status: None   Collection Time: 05/31/20  7:23 PM   Specimen: Nasopharyngeal Swab  Result Value Ref Range Status   SARS Coronavirus 2 by RT PCR NEGATIVE NEGATIVE Final    Comment: (NOTE) SARS-CoV-2 target nucleic acids are NOT DETECTED.  The SARS-CoV-2 RNA is generally detectable in upper respiratoy specimens during the acute phase of infection. The lowest concentration of SARS-CoV-2 viral copies this assay can detect is 131 copies/mL. A negative result does not preclude SARS-Cov-2  infection and should not be used as the sole basis for treatment or other patient management decisions. A negative result may occur with  improper specimen collection/handling, submission of specimen other than nasopharyngeal swab, presence of viral mutation(s) within the areas targeted by this assay, and inadequate number of viral copies (<131 copies/mL). A negative result must be combined with clinical observations, patient history, and epidemiological information. The expected result is Negative.  Fact Sheet for Patients:  PinkCheek.be  Fact Sheet for Healthcare Providers:  GravelBags.it  This test is no t yet approved or cleared by the [place] FDA and  has been authorized for detection and/or diagnosis of SARS-CoV-2 by FDA under an Emergency Use Authorization (EUA). This EUA will remain  in effect (meaning this test can be used) for the duration of the COVID-19 declaration under Section 564(b)(1) of the Act, 21 U.S.C. section 360bbb-3(b)(1), unless the authorization is terminated or revoked sooner.     Influenza A by PCR NEGATIVE NEGATIVE Final   Influenza B by PCR NEGATIVE NEGATIVE Final    Comment: (NOTE) The Xpert Xpress SARS-CoV-2/FLU/RSV assay is intended as an aid in  the diagnosis of influenza from Nasopharyngeal swab specimens and  should not be used as a sole basis for treatment. Nasal washings and  aspirates are unacceptable for Xpert Xpress SARS-CoV-2/FLU/RSV  testing.  Fact Sheet for Patients: PinkCheek.be  Fact Sheet for Healthcare Providers: GravelBags.it  This test is not yet approved or cleared by the [place] FDA and  has been authorized for detection and/or diagnosis of SARS-CoV-2 by  FDA under an Emergency Use Authorization (EUA). This EUA will remain  in effect (meaning this test can be used) for the duration of the  Covid-19  declaration under Section 564(b)(1) of the Act, 21  U.S.C. section 360bbb-3(b)(1), unless the authorization is  terminated or revoked. Performed at [facility] [place] [address], [place], [place] [address]      Radiology Studies: No results found.  Scheduled Meds: . anastrozole  1 mg Oral Daily  . Chlorhexidine Gluconate Cloth  6 each Topical Daily  . dexamethasone  4 mg Oral Daily  . docusate sodium  100 mg Oral BID  . feeding supplement (ENSURE ENLIVE)  237 mL Oral BID BM  . feeding supplement (KATE FARMS STANDARD 1.4)  325 mL Oral BID BM  . folic acid  1 mg Oral Daily  . gabapentin  100 mg Oral BID  . heparin  5,000 Units Subcutaneous Q8H  . metolazone  10 mg Oral Daily  . mineral oil-hydrophilic petrolatum   Topical BID  . multivitamin with minerals  1 tablet Oral Daily  . nystatin   Topical TID  . sodium chloride flush  3 mL Intravenous Q12H  . thiamine  100 mg Oral Daily   Or  . thiamine  100 mg Intravenous Daily   Continuous Infusions: . furosemide       LOS: 8 days   Marylu Lund, MD [facility] Pager On Amion  If 7PM-7AM, please contact night-coverage 06/08/2020, 3:41 PM

## 2020-06-08 NOTE — Progress Notes (Signed)
[facility] Progress Note  Subjective: creat down 2.5 , Na+ down 129, pt getting up w/ help , PT to start working w/ ambulating tomorrow per patient.  Vitals:   06/07/20 1343 06/07/20 1344 06/07/20 2055 06/08/20 0521  BP: 119/70 119/70 130/71 (!) 169/76  Pulse: 67 67 68 75  Resp:   20 15  Temp: (!) 97.5 F (36.4 C) (!) 97.5 F (36.4 C) 97.9 F (36.6 C) 98.4 F (36.9 C)  TempSrc: Oral Oral Oral Oral  SpO2:  98% 100% 99%  Weight:      Height:        Exam:  alert, nad , morbidly obese  no jvd  Chest rales L base, poor resp effort  Cor reg no RG  Abd soft ntnd no ascites, 2-3+ pannus edema   Ext 2+ hip and LE edema bilat   Alert, NF, ox3, deconditioned      Date   Creat   eGFR   2009- 2018  0.67- 1.00   2020   0.66- 1.30   10/2019  0.70   10/2019  0.66   11/2019  1.26- 2.40   01/02/2020  1.48   5/25- 01/28/20  6.02 >> 1.98  AKI episode    01/2020  1.2- 1.7  35- 55 ml/min     02/2020  0.80- 1.67  39- > 60 ml/min     03/2020  1.20   04/2020  1.07- 2.77   Oct 2- 3, 2021 4.49 >> 4.19         Renal US >>   11- 13 cm kidneys w/o hydro and normal echo, L kidney not well visualized due to body habitus     UA  10/2 >> 100 prot, 0-5 rbc/ wbc, many bact      Serum Osm 270-274      UNa < 10 on 10/2, Uosm 198      UCr 176 on 10/2       CXR 10/2 - read as vasc congestion, but appears normal to me     ECHO last done June 2021 - LVEF 60-70%, G1DD   Assessment/ Plan: 1. AoCKD 3b - b/l creat 1.2- 1.7, eGFR 35- 55 ml/min. Suspect AKI due to nsaids + cardiorenal syndrome w/ acute on chronic diastolic CHF.  BP's high on presentation w/ sig vol overload/ anasarca and low UNa. No obstruction on [place]. CXR clear from 10/02. UPC ratio 568m and UA negative. With diuresis creat down 4.2 > 3.7 > 3.4 > 3.2 > 2.8 > 2.5 today. Renal fxn continues to improve. Still has lots of edema and will likely be here a while longer.  2. Hyponatremia - hypervolemic w/ edematous state. Na+ up 129- 132  range now.  3. Hx breast cancer - failed regular chemo d/t side effects, now on hormonal Rx. Finished radiation a few weeks ago, also had L mastectomy.  4. Anemia/ pancytopenia - not sure cause, hx prior chemoRx.  SPEP negative for M-spike.  FLC ratio up slightly, doubt clinically significant.  5. HTN - bp's down yest, coreg dc'd, BP's  better  6. Morbid obesity - BMI 60.  Obesity is driving all of this including diast HF/ vol overload and renal failure.  IF she would be a candidate she should have wt loss surgery, have d/w pt and pmd.      RKelly Splinter10/05/2020, 9:36 AM   Recent Labs  Lab 06/05/20 0532 06/06/20 0442 06/07/20 0608 06/08/20 0547  K 4.1   < >  3.9 3.7  BUN 71*   < > 80* 81*  CREATININE 3.28*   < > 2.80* 2.58*  CALCIUM 9.0   < > 9.7 9.4  HGB 7.2*  --   --  7.1*   < > = values in this interval not displayed.   Inpatient medications: . anastrozole  1 mg Oral Daily  . Chlorhexidine Gluconate Cloth  6 each Topical Daily  . dexamethasone  4 mg Oral Daily  . docusate sodium  100 mg Oral BID  . feeding supplement (ENSURE ENLIVE)  237 mL Oral BID BM  . feeding supplement (KATE FARMS STANDARD 1.4)  325 mL Oral BID BM  . folic acid  1 mg Oral Daily  . gabapentin  100 mg Oral BID  . heparin  5,000 Units Subcutaneous Q8H  . metolazone  10 mg Oral Daily  . mineral oil-hydrophilic petrolatum   Topical BID  . multivitamin with minerals  1 tablet Oral Daily  . nystatin   Topical TID  . sodium chloride flush  3 mL Intravenous Q12H  . thiamine  100 mg Oral Daily   Or  . thiamine  100 mg Intravenous Daily   . furosemide     acetaminophen **OR** acetaminophen, HYDROcodone-acetaminophen, ondansetron **OR** ondansetron (ZOFRAN) IV, polyethylene glycol, sodium chloride flush

## 2020-06-09 DIAGNOSIS — N179 Acute kidney failure, unspecified: Secondary | ICD-10-CM | POA: Diagnosis not present

## 2020-06-09 DIAGNOSIS — D638 Anemia in other chronic diseases classified elsewhere: Secondary | ICD-10-CM | POA: Diagnosis not present

## 2020-06-09 LAB — CBC
HCT: 21.3 % — ABNORMAL LOW (ref 36.0–46.0)
Hemoglobin: 7.4 g/dL — ABNORMAL LOW (ref 12.0–15.0)
MCH: 33.2 pg (ref 26.0–34.0)
MCHC: 34.7 g/dL (ref 30.0–36.0)
MCV: 95.5 fL (ref 80.0–100.0)
Platelets: 192 10*3/uL (ref 150–400)
RBC: 2.23 MIL/uL — ABNORMAL LOW (ref 3.87–5.11)
RDW: 15.3 % (ref 11.5–15.5)
WBC: 4.7 10*3/uL (ref 4.0–10.5)
nRBC: 0 % (ref 0.0–0.2)

## 2020-06-09 LAB — COMPREHENSIVE METABOLIC PANEL
ALT: 57 U/L — ABNORMAL HIGH (ref 0–44)
AST: 74 U/L — ABNORMAL HIGH (ref 15–41)
Albumin: 3 g/dL — ABNORMAL LOW (ref 3.5–5.0)
Alkaline Phosphatase: 54 U/L (ref 38–126)
Anion gap: 12 (ref 5–15)
BUN: 83 mg/dL — ABNORMAL HIGH (ref 8–23)
CO2: 30 mmol/L (ref 22–32)
Calcium: 9.4 mg/dL (ref 8.9–10.3)
Chloride: 90 mmol/L — ABNORMAL LOW (ref 98–111)
Creatinine, Ser: 2.3 mg/dL — ABNORMAL HIGH (ref 0.44–1.00)
GFR, Estimated: 22 mL/min — ABNORMAL LOW (ref >60)
Glucose, Bld: 114 mg/dL — ABNORMAL HIGH (ref 70–99)
Potassium: 3.6 mmol/L (ref 3.5–5.1)
Sodium: 132 mmol/L — ABNORMAL LOW (ref 135–145)
Total Bilirubin: 0.8 mg/dL (ref 0.3–1.2)
Total Protein: 6.6 g/dL (ref 6.5–8.1)

## 2020-06-09 NOTE — Progress Notes (Signed)
PROGRESS NOTE    [name]  OAC:[id number] DOB: Sep 08, 1955 DOA: 05/31/2020 PCP: [name], MD    Brief Narrative:  64 y.o.femalewith medical history significant of breast CA sp Mastectomy and radiation on the left,morbid obesity, HTN, multiple skin ulcers, Alcohol liver disease CKD stage IIIa, Grade 1 diastolic CHF.  Presented withbilateral leg swelling and burning on the bottom.  She has been urinating a bit more than usual.  Reports she drinks 4 bottle of water a day and ensure.  She has no appetite and has not been eating.  She gained fluid weight.  Reports her ulcers have been healing.  She can walk a bit uses wheelchair.  She still drinks few mixed drinks a day 2-3/day no hx of withdraw.  Last left breast radiation therapy was 3 days ago  Reports feet are burning.  Hurts to stand up.  No diarrhea no melena no blood in stool.  Work up revealed No evidence of DVT, Hyponatremia and acute on chronic CKD3B.  TRH was asked to admit.  Nephrology was consulted and is following.  Assessment & Plan:   Active Problems:   Essential hypertension   Alcohol dependence ([place])   Edema   Malignant neoplasm of upper-outer quadrant of left breast in female, estrogen receptor positive ([place])   Morbid obesity with BMI of 60.0-69.9, adult (HCC)   Acute renal failure (ARF) (HCC)   Thrombocytopenia (HCC)   Debility   Anemia of chronic disease   Chronic kidney disease, stage 3a (HCC)   Hyponatremia   Pancytopenia (HCC)   Breast cancer (HCC)  Hypervolemic hyponatremia Presented with serum sodium 119, low serum osmolality, hypervolemic on exam Pt is continued on lasix gtt q8hrs with metolazone added 10/8  Good diuresis thus far with improving renal function  Sodium currently 132 Recheck bmet in AM  Improving, nonoliguric AKI on CKD 3B Baseline creatinine noted to be around 2.7 Presented with creatinine of greater than 4 with GFR of 12 Creatinine now much improved at 2.30 Continue to  avoid nephrotoxins and hypotension Continue diuresis as per nephrology, very good results with lasix with metolazone. Now on lasix alone Even though pt is improving nicely, she is remains volume overloaded, thus would likely require continued hospital stay Repeat bmet in AM  Anemia of chronic disease secondary to CKD No overt bleeding Hgb stable at 7.4 Repeat cbc in AM Discussed with Oncology. Pt may benefit from GI follow up on d/c Iron level normal at 130  Leukopenia/thrombocytopenia likely secondary to acute illness WBC normal at 4.7 Repeat cbc in AM  Resolved post repletion: Hypokalemia Within normal limits Recheck bmet in AM  Isolated hyperbilirubinemia T bili 1.3 on 06/01/2020 Suspect element of hepatic congestion related to below acute CHF  Acute on Chronic diastolic CHF Last 2D echo done on 02/11/2020 showed LVEF 70 to 75% and grade 1 diastolic dysfunction Repeat 2D echo reviewed. Findings of EF 01-09%NATF grade 2 diastolic dysfunction Continue strict I/O's Recently discussed case with Cardiology who agrees with plan for diuresis above. Cardiology recommends establishing follow up with Cardiology when pt is ultimately discharged  Physical debility PT recs for supervision for mobility/oob, HHPT Fall precautions  History of breast cancer/radiation Last radiation 3 days prior to presentation affecting left breast, Nodes noted on exam, POA Wound care specialist for local wound care recommendations Continue local wound care as toleraetd  Severe morbid obesity BMI 60.11 Recommend diet/lifestyle modification Will benefit from referral to bariatric weight loss center  DVT prophylaxis: Heparin subq  Code Status: Full Family Communication: Pt in room, family not at bedside  Status is: Inpatient  Remains inpatient appropriate because:Hemodynamically unstable, Ongoing diagnostic testing needed not appropriate for outpatient work up, Unsafe d/c plan and IV  treatments appropriate due to intensity of illness or inability to take PO  Dispo:  Patient From: Home  Planned Disposition: Home with Health Care Svc  Expected discharge date: 06/11/20  Medically stable for discharge: No   Consultants:   Nephrology  Hematology  Procedures:     Antimicrobials: Anti-infectives (From admission, onward)   None      Subjective: Reports swelling is improving  Objective: Vitals:   06/08/20 1254 06/08/20 2054 06/09/20 0500 06/09/20 0547  BP: 122/60 133/65  135/68  Pulse: 75 68  70  Resp: 16 16  16   Temp: 97.6 F (36.4 C) 97.8 F (36.6 C)  98.4 F (36.9 C)  TempSrc: Oral Oral  Oral  SpO2: 100% 96%  99%  Weight:   132.1 kg   Height:        Intake/Output Summary (Last 24 hours) at 06/09/2020 1602 Last data filed at 06/09/2020 1211 Gross per 24 hour  Intake 612 ml  Output 5351 ml  Net -4739 ml   Filed Weights   06/06/20 0500 06/07/20 0630 06/09/20 0500  Weight: (!) 143.4 kg (!) 144.3 kg 132.1 kg    Examination: General exam: Awake, laying in bed, in nad Respiratory system: Normal respiratory effort, no wheezing Cardiovascular system: regular rate, s1, s2 Gastrointestinal system: Soft, nondistended, positive BS Central nervous system: CN2-12 grossly intact, strength intact Extremities: Perfused, no clubbing, BLE edema improving Skin: Normal skin turgor, no notable skin lesions seen Psychiatry: Mood normal // no visual hallucinations   Data Reviewed: I have personally reviewed following labs and imaging studies  CBC: Recent Labs  Lab 06/03/20 0100 06/04/20 0503 06/05/20 0532 06/08/20 0547 06/09/20 0558  WBC 2.9* 5.3 6.0 6.0 4.7  HGB 7.7* 7.4* 7.2* 7.1* 7.4*  HCT 20.9* 20.4* 19.7* 20.8* 21.3*  MCV 93.3 93.6 93.4 96.3 95.5  PLT 128* 134* 138* 179 932   Basic Metabolic Panel: Recent Labs  Lab 06/05/20 0532 06/06/20 0442 06/07/20 0608 06/08/20 0547 06/09/20 0558  NA 125* 126* 131* 129* 132*  K 4.1 3.9 3.9 3.7  3.6  CL 91* 93* 93* 89* 90*  CO2 21* 23 27 27 30   GLUCOSE 123* 126* 125* 121* 114*  BUN 71* 78* 80* 81* 83*  CREATININE 3.28* 3.25* 2.80* 2.58* 2.30*  CALCIUM 9.0 9.1 9.7 9.4 9.4   GFR: Estimated Creatinine Clearance: 31.8 mL/min (A) (by C-G formula based on SCr of 2.3 mg/dL (H)). Liver Function Tests: Recent Labs  Lab 06/09/20 0558  AST 74*  ALT 57*  ALKPHOS 54  BILITOT 0.8  PROT 6.6  ALBUMIN 3.0*   No results for input(s): LIPASE, AMYLASE in the last 168 hours. No results for input(s): AMMONIA in the last 168 hours. Coagulation Profile: No results for input(s): INR, PROTIME in the last 168 hours. Cardiac Enzymes: No results for input(s): CKTOTAL, CKMB, CKMBINDEX, TROPONINI in the last 168 hours. BNP (last 3 results) No results for input(s): PROBNP in the last 8760 hours. HbA1C: No results for input(s): HGBA1C in the last 72 hours. CBG: No results for input(s): GLUCAP in the last 168 hours. Lipid Profile: No results for input(s): CHOL, HDL, LDLCALC, TRIG, CHOLHDL, LDLDIRECT in the last 72 hours. Thyroid Function Tests: No results for input(s): TSH, T4TOTAL, FREET4, T3FREE, THYROIDAB in the last  72 hours. Anemia Panel: No results for input(s): VITAMINB12, FOLATE, FERRITIN, TIBC, IRON, RETICCTPCT in the last 72 hours. Sepsis Labs: No results for input(s): PROCALCITON, LATICACIDVEN in the last 168 hours.  Recent Results (from the past 240 hour(s))  Respiratory Panel by RT PCR (Flu A&B, Covid) - Nasopharyngeal Swab     Status: None   Collection Time: 05/31/20  7:23 PM   Specimen: Nasopharyngeal Swab  Result Value Ref Range Status   SARS Coronavirus 2 by RT PCR NEGATIVE NEGATIVE Final    Comment: (NOTE) SARS-CoV-2 target nucleic acids are NOT DETECTED.  The SARS-CoV-2 RNA is generally detectable in upper respiratoy specimens during the acute phase of infection. The lowest concentration of SARS-CoV-2 viral copies this assay can detect is 131 copies/mL. A negative  result does not preclude SARS-Cov-2 infection and should not be used as the sole basis for treatment or other patient management decisions. A negative result may occur with  improper specimen collection/handling, submission of specimen other than nasopharyngeal swab, presence of viral mutation(s) within the areas targeted by this assay, and inadequate number of viral copies (<131 copies/mL). A negative result must be combined with clinical observations, patient history, and epidemiological information. The expected result is Negative.  Fact Sheet for Patients:  PinkCheek.be  Fact Sheet for Healthcare Providers:  GravelBags.it  This [name] is no t yet approved or cleared by the [place] FDA and  has been authorized for detection and/or diagnosis of SARS-CoV-2 by FDA under an Emergency Use Authorization (EUA). This EUA will remain  in effect (meaning this [name] can be used) for the duration of the COVID-19 declaration under Section 564(b)(1) of the Act, 21 U.S.C. section 360bbb-3(b)(1), unless the authorization is terminated or revoked sooner.     Influenza A by PCR NEGATIVE NEGATIVE Final   Influenza B by PCR NEGATIVE NEGATIVE Final    Comment: (NOTE) The Xpert Xpress SARS-CoV-2/FLU/RSV assay is intended as an aid in  the diagnosis of influenza from Nasopharyngeal swab specimens and  should not be used as a sole basis for treatment. Nasal washings and  aspirates are unacceptable for Xpert Xpress SARS-CoV-2/FLU/RSV  testing.  Fact Sheet for Patients: PinkCheek.be  Fact Sheet for Healthcare Providers: GravelBags.it  This [name] is not yet approved or cleared by the [place] FDA and  has been authorized for detection and/or diagnosis of SARS-CoV-2 by  FDA under an Emergency Use Authorization (EUA). This EUA will remain  in effect (meaning this [name] can be used)  for the duration of the  Covid-19 declaration under Section 564(b)(1) of the Act, 21  U.S.C. section 360bbb-3(b)(1), unless the authorization is  terminated or revoked. Performed at [facility], [place] [address], [place], [place] [address]      Radiology Studies: No results found.  Scheduled Meds: . anastrozole  1 mg Oral Daily  . Chlorhexidine Gluconate Cloth  6 each Topical Daily  . dexamethasone  4 mg Oral Daily  . docusate sodium  100 mg Oral BID  . feeding supplement (ENSURE ENLIVE)  237 mL Oral BID BM  . feeding supplement (KATE FARMS STANDARD 1.4)  325 mL Oral BID BM  . folic acid  1 mg Oral Daily  . gabapentin  100 mg Oral BID  . heparin  5,000 Units Subcutaneous Q8H  . mineral oil-hydrophilic petrolatum   Topical BID  . multivitamin with minerals  1 tablet Oral Daily  . nystatin   Topical TID  . sodium chloride flush  3  mL Intravenous Q12H  . thiamine  100 mg Oral Daily   Or  . thiamine  100 mg Intravenous Daily   Continuous Infusions:    LOS: 9 days   Marylu Lund, MD [facility] Pager On Amion  If 7PM-7AM, please contact night-coverage 06/09/2020, 4:02 PM

## 2020-06-09 NOTE — Progress Notes (Signed)
Physical Therapy Treatment Patient Details Name: [name] MRN: [id number] DOB: Jan 14, 1956 Today's Date: 06/09/2020    History of Present Illness 64 y.o. female with medical history significant of breast CA sp Mastectomy and radiation on the left, morbid obesity, HTN, multiple skin ulcers, Alcohol liver diseaseCKD stage IIIa, Grade 1 diastolic CHF.  Presented with bilateral leg swelling and burning. Most recent radiation session was 3 days PTA. Dx of hyponatremia    PT Comments    Progressing slowly with mobility. Pt fatigues easily and becomes very dyspneic with ambulation. She was  barely able to walk 25 feet on today. O2 >90% on RA. Will continue to follow and progress activity as tolerated.   Follow Up Recommendations  Home health PT;Supervision for mobility/OOB     Equipment Recommendations  None recommended by PT    Recommendations for Other Services       Precautions / Restrictions Precautions Precautions: Fall Precaution Comments: fell last week, denies other falls in past 1 year Restrictions Weight Bearing Restrictions: No    Mobility  Bed Mobility               General bed mobility comments: oob in recliner  Transfers Overall transfer level: Needs assistance Equipment used: Rolling walker (2 wheeled) Transfers: Sit to/from Stand Sit to Stand: Min guard         General transfer comment: Increased time. Uses momentum to get to stasnding. Uncontrolled descent 2* fatigue after walking  Ambulation/Gait Ambulation/Gait assistance: Min assist Gait Distance (Feet): 25 Feet Assistive device: Rolling walker (2 wheeled) Gait Pattern/deviations: Step-through pattern;Decreased stride length     General Gait Details: Assist to steady intermittently. Pt fatigues easily. Dyspnea 3/4.   Stairs             Wheelchair Mobility    Modified Rankin (Stroke Patients Only)       Balance Overall balance assessment: Needs assistance          Standing balance support: Bilateral upper extremity supported Standing balance-Leahy Scale: Poor                              Cognition Arousal/Alertness: Awake/alert Behavior During Therapy: WFL for tasks assessed/performed Overall Cognitive Status: Within Functional Limits for tasks assessed                                        Exercises      General Comments        Pertinent Vitals/Pain Pain Assessment: No/denies pain    Home Living                      Prior Function            PT Goals (current goals can now be found in the care plan section) Progress towards PT goals: Progressing toward goals    Frequency    Min 3X/week      PT Plan Current plan remains appropriate    Co-evaluation              AM-PAC PT "6 Clicks" Mobility   Outcome Measure  Help needed turning from your back to your side while in a flat bed without using bedrails?: A Lot Help needed moving from lying on your back to sitting on the side of a flat bed without using bedrails?:  A Lot Help needed moving to and from a bed to a chair (including a wheelchair)?: A Little Help needed standing up from a chair using your arms (e.g., wheelchair or bedside chair)?: A Little Help needed to walk in hospital room?: A Little Help needed climbing 3-5 steps with a railing? : A Lot 6 Click Score: 15    End of Session   Activity Tolerance: Patient limited by fatigue Patient left: in chair;with call bell/phone within reach   PT Visit Diagnosis: Difficulty in walking, not elsewhere classified (R26.2);History of falling (Z91.81)     Time: 9678-9381 PT Time Calculation (min) (ACUTE ONLY): 12 min  Charges:  $Gait Training: 8-22 mins                      Doreatha Massed, PT Acute Rehabilitation  Office: [phone] Pager: [phone]

## 2020-06-09 NOTE — Plan of Care (Signed)
Pt resting comfortably at this time.  VS WNL this am.   Problem: Education: Goal: Knowledge of General Education information will improve Description: Including pain rating scale, medication(s)/side effects and non-pharmacologic comfort measures Outcome: Progressing   Problem: Health Behavior/Discharge Planning: Goal: Ability to manage health-related needs will improve Outcome: Progressing   Problem: Clinical Measurements: Goal: Ability to maintain clinical measurements within normal limits will improve Outcome: Progressing Goal: Will remain free from infection Outcome: Progressing Goal: Diagnostic test results will improve Outcome: Progressing Goal: Respiratory complications will improve Outcome: Progressing Goal: Cardiovascular complication will be avoided Outcome: Progressing   Problem: Activity: Goal: Risk for activity intolerance will decrease Outcome: Progressing   Problem: Nutrition: Goal: Adequate nutrition will be maintained Outcome: Progressing   Problem: Coping: Goal: Level of anxiety will decrease Outcome: Progressing   Problem: Elimination: Goal: Will not experience complications related to bowel motility Outcome: Progressing   Problem: Pain Managment: Goal: General experience of comfort will improve Outcome: Progressing   Problem: Safety: Goal: Ability to remain free from injury will improve Outcome: Progressing   Problem: Skin Integrity: Goal: Risk for impaired skin integrity will decrease Outcome: Progressing   Problem: Elimination: Goal: Will not experience complications related to urinary retention Outcome: Not Progressing

## 2020-06-09 NOTE — Progress Notes (Addendum)
[facility] ROUNDING NOTE   Subjective:   Brief history: 64 year old lady with a history of breast CA status post mastectomy and radiation on the left morbid obesity hypertension multiple skin ulcers and alcoholic liver disease.  She has grade 1 diastolic dysfunction she presented with lower extremity edema.  She was admitted also with acute on chronic kidney disease.  Baseline creatinine is 1.2 to 1.7 mg/dL.  With diuresis it appears that her creatinine has improved.  Blood pressure 135/68 pulse 74 temperature 98.4 O2 sats 90% room air  Urine output 5 L 06/08/2020   Sodium 132 potassium 3.6 chloride 90 CO2 30 BUN 83 creatinine 2.3 albumin 3.  Hemoglobin 7.4  Arimidex 1 mg daily dexamethasone 4 mg daily, folic acid 1 mg daily, metolazone 10 mg daily gabapentin 100 mg twice daily, Lasix 160 mg every 8 hours     Objective:  Vital signs in last 24 hours:  Temp:  [97.6 F (36.4 C)-98.4 F (36.9 C)] 98.4 F (36.9 C) (10/11 0547) Pulse Rate:  [68-75] 70 (10/11 0547) Resp:  [16] 16 (10/11 0547) BP: (122-135)/(60-68) 135/68 (10/11 0547) SpO2:  [96 %-100 %] 99 % (10/11 0547) Weight:  [132.1 kg] 132.1 kg (10/11 0500)  Weight change:  Filed Weights   06/06/20 0500 06/07/20 0630 06/09/20 0500  Weight: (!) 143.4 kg (!) 144.3 kg 132.1 kg    Intake/Output: I/O last 3 completed shifts: In: 81 [P.O.:960; IV Piggyback:132] Out: 7902 [Urine:7900; Stool:2]   Intake/Output this shift:  No intake/output data recorded.  alert, nad , morbidly obese  no jvd  Chest rales L base, poor resp effort  Cor reg no RG  Abd soft ntnd no ascites,  pannus     Ext no edema noted  On exam   Alert, NF, ox3, deconditioned   Basic Metabolic Panel: Recent Labs  Lab 06/05/20 0532 06/05/20 0532 06/06/20 0442 06/06/20 0442 06/07/20 0608 06/08/20 0547 06/09/20 0558  NA 125*  --  126*  --  131* 129* 132*  K 4.1  --  3.9  --  3.9 3.7 3.6  CL 91*  --  93*  --  93* 89* 90*  CO2 21*   --  23  --  '27 27 30  ' GLUCOSE 123*  --  126*  --  125* 121* 114*  BUN 71*  --  78*  --  80* 81* 83*  CREATININE 3.28*  --  3.25*  --  2.80* 2.58* 2.30*  CALCIUM 9.0   < > 9.1   < > 9.7 9.4 9.4   < > = values in this interval not displayed.    Liver Function Tests: Recent Labs  Lab 06/09/20 0558  AST 74*  ALT 57*  ALKPHOS 54  BILITOT 0.8  PROT 6.6  ALBUMIN 3.0*   No results for input(s): LIPASE, AMYLASE in the last 168 hours. No results for input(s): AMMONIA in the last 168 hours.  CBC: Recent Labs  Lab 06/03/20 0100 06/04/20 0503 06/05/20 0532 06/08/20 0547 06/09/20 0558  WBC 2.9* 5.3 6.0 6.0 4.7  HGB 7.7* 7.4* 7.2* 7.1* 7.4*  HCT 20.9* 20.4* 19.7* 20.8* 21.3*  MCV 93.3 93.6 93.4 96.3 95.5  PLT 128* 134* 138* 179 192    Cardiac Enzymes: No results for input(s): CKTOTAL, CKMB, CKMBINDEX, TROPONINI in the last 168 hours.  BNP: Invalid input(s): POCBNP  CBG: No results for input(s): GLUCAP in the last 168 hours.  Microbiology: Results for orders placed or performed during the hospital  encounter of 05/31/20  Respiratory Panel by RT PCR (Flu A&B, Covid) - Nasopharyngeal Swab     Status: None   Collection Time: 05/31/20  7:23 PM   Specimen: Nasopharyngeal Swab  Result Value Ref Range Status   SARS Coronavirus 2 by RT PCR NEGATIVE NEGATIVE Final    Comment: (NOTE) SARS-CoV-2 target nucleic acids are NOT DETECTED.  The SARS-CoV-2 RNA is generally detectable in upper respiratoy specimens during the acute phase of infection. The lowest concentration of SARS-CoV-2 viral copies this assay can detect is 131 copies/mL. A negative result does not preclude SARS-Cov-2 infection and should not be used as the sole basis for treatment or other patient management decisions. A negative result may occur with  improper specimen collection/handling, submission of specimen other than nasopharyngeal swab, presence of viral mutation(s) within the areas targeted by this assay, and  inadequate number of viral copies (<131 copies/mL). A negative result must be combined with clinical observations, patient history, and epidemiological information. The expected result is Negative.  Fact Sheet for Patients:  PinkCheek.be  Fact Sheet for Healthcare Providers:  GravelBags.it  This test is no t yet approved or cleared by the [place] FDA and  has been authorized for detection and/or diagnosis of SARS-CoV-2 by FDA under an Emergency Use Authorization (EUA). This EUA will remain  in effect (meaning this test can be used) for the duration of the COVID-19 declaration under Section 564(b)(1) of the Act, 21 U.S.C. section 360bbb-3(b)(1), unless the authorization is terminated or revoked sooner.     Influenza A by PCR NEGATIVE NEGATIVE Final   Influenza B by PCR NEGATIVE NEGATIVE Final    Comment: (NOTE) The Xpert Xpress SARS-CoV-2/FLU/RSV assay is intended as an aid in  the diagnosis of influenza from Nasopharyngeal swab specimens and  should not be used as a sole basis for treatment. Nasal washings and  aspirates are unacceptable for Xpert Xpress SARS-CoV-2/FLU/RSV  testing.  Fact Sheet for Patients: PinkCheek.be  Fact Sheet for Healthcare Providers: GravelBags.it  This test is not yet approved or cleared by the [place] FDA and  has been authorized for detection and/or diagnosis of SARS-CoV-2 by  FDA under an Emergency Use Authorization (EUA). This EUA will remain  in effect (meaning this test can be used) for the duration of the  Covid-19 declaration under Section 564(b)(1) of the Act, 21  U.S.C. section 360bbb-3(b)(1), unless the authorization is  terminated or revoked. Performed at [facility], [place] [address], [place], [place] [address]     Coagulation Studies: No results for input(s): LABPROT, INR in the last 72  hours.  Urinalysis: No results for input(s): COLORURINE, LABSPEC, PHURINE, GLUCOSEU, HGBUR, BILIRUBINUR, KETONESUR, PROTEINUR, UROBILINOGEN, NITRITE, LEUKOCYTESUR in the last 72 hours.  Invalid input(s): APPERANCEUR    Imaging: No results found.   Medications:   . furosemide 160 mg (06/09/20 0552)   . anastrozole  1 mg Oral Daily  . Chlorhexidine Gluconate Cloth  6 each Topical Daily  . dexamethasone  4 mg Oral Daily  . docusate sodium  100 mg Oral BID  . feeding supplement (ENSURE ENLIVE)  237 mL Oral BID BM  . feeding supplement (KATE FARMS STANDARD 1.4)  325 mL Oral BID BM  . folic acid  1 mg Oral Daily  . gabapentin  100 mg Oral BID  . heparin  5,000 Units Subcutaneous Q8H  . metolazone  10 mg Oral Daily  . mineral oil-hydrophilic petrolatum   Topical BID  . multivitamin  with minerals  1 tablet Oral Daily  . nystatin   Topical TID  . sodium chloride flush  3 mL Intravenous Q12H  . thiamine  100 mg Oral Daily   Or  . thiamine  100 mg Intravenous Daily   acetaminophen **OR** acetaminophen, HYDROcodone-acetaminophen, ondansetron **OR** ondansetron (ZOFRAN) IV, polyethylene glycol, sodium chloride flush  Assessment/ Plan:  1. AoCKD 3b - b/l creat 1.2- 1.7, eGFR 35- 55 ml/min. Suspect AKI due to nsaids + cardiorenal syndrome w/ acute on chronic diastolic CHF.  BP's high on presentation w/ sig vol overload/ anasarca and low UNa. No obstruction on [place]. CXR clear from 10/02. UPC ratio 541m and UA negative. With diuresis Her edema is improved Will discontinue metolazone and lasix at this point 2. Hyponatremia - hypervolemic w/ edematous state. Na+ up 129- 132 range now.  Will discontinue metolazone.    3. Hx breast cancer - failed regular chemo d/t side effects, now on hormonal Rx. Finished radiation a few weeks ago, also had L mastectomy.  4. Anemia/ pancytopenia - not sure cause, hx prior chemoRx.  SPEP negative for M-spike.  FLC ratio up slightly, doubt clinically significant.   5. HTN - bp's down yest, coreg dc'd, BP's  better  6. Morbid obesity - BMI 60.  Obesity is driving all of this including diast HF/ vol overload and renal failure.    LOS: 9[place]'@TODAY' '@7' :33 AM

## 2020-06-10 DIAGNOSIS — N179 Acute kidney failure, unspecified: Secondary | ICD-10-CM | POA: Diagnosis not present

## 2020-06-10 DIAGNOSIS — D638 Anemia in other chronic diseases classified elsewhere: Secondary | ICD-10-CM | POA: Diagnosis not present

## 2020-06-10 LAB — COMPREHENSIVE METABOLIC PANEL
ALT: 64 U/L — ABNORMAL HIGH (ref 0–44)
AST: 78 U/L — ABNORMAL HIGH (ref 15–41)
Albumin: 3 g/dL — ABNORMAL LOW (ref 3.5–5.0)
Alkaline Phosphatase: 53 U/L (ref 38–126)
Anion gap: 15 (ref 5–15)
BUN: 87 mg/dL — ABNORMAL HIGH (ref 8–23)
CO2: 33 mmol/L — ABNORMAL HIGH (ref 22–32)
Calcium: 9.3 mg/dL (ref 8.9–10.3)
Chloride: 90 mmol/L — ABNORMAL LOW (ref 98–111)
Creatinine, Ser: 2.31 mg/dL — ABNORMAL HIGH (ref 0.44–1.00)
GFR, Estimated: 22 mL/min — ABNORMAL LOW (ref >60)
Glucose, Bld: 132 mg/dL — ABNORMAL HIGH (ref 70–99)
Potassium: 3.6 mmol/L (ref 3.5–5.1)
Sodium: 138 mmol/L (ref 135–145)
Total Bilirubin: 0.7 mg/dL (ref 0.3–1.2)
Total Protein: 6.5 g/dL (ref 6.5–8.1)

## 2020-06-10 LAB — CBC
HCT: 21.5 % — ABNORMAL LOW (ref 36.0–46.0)
Hemoglobin: 7.6 g/dL — ABNORMAL LOW (ref 12.0–15.0)
MCH: 33.8 pg (ref 26.0–34.0)
MCHC: 35.3 g/dL (ref 30.0–36.0)
MCV: 95.6 fL (ref 80.0–100.0)
Platelets: 196 10*3/uL (ref 150–400)
RBC: 2.25 MIL/uL — ABNORMAL LOW (ref 3.87–5.11)
RDW: 15.6 % — ABNORMAL HIGH (ref 11.5–15.5)
WBC: 5 10*3/uL (ref 4.0–10.5)
nRBC: 0 % (ref 0.0–0.2)

## 2020-06-10 LAB — MAGNESIUM: Magnesium: 1.4 mg/dL — ABNORMAL LOW (ref 1.7–2.4)

## 2020-06-10 MED ORDER — MAGNESIUM SULFATE 4 GM/100ML IV SOLN
4.0000 g | Freq: Once | INTRAVENOUS | Status: AC
  Administered 2020-06-10: 4 g via INTRAVENOUS
  Filled 2020-06-10: qty 100

## 2020-06-10 MED ORDER — POTASSIUM CHLORIDE CRYS ER 20 MEQ PO TBCR
40.0000 meq | EXTENDED_RELEASE_TABLET | Freq: Once | ORAL | Status: AC
  Administered 2020-06-10: 40 meq via ORAL
  Filled 2020-06-10: qty 2

## 2020-06-10 NOTE — Progress Notes (Signed)
[facility] ROUNDING NOTE   Subjective:   Brief history: 64 year old lady with a history of breast CA status post mastectomy and radiation on the left morbid obesity hypertension multiple skin ulcers and alcoholic liver disease.  She has grade 1 diastolic dysfunction she presented with lower extremity edema.  She was admitted also with acute on chronic kidney disease.  Baseline creatinine is 1.2 to 1.7 mg/dL.  With diuresis it appears that her creatinine has improved.  Blood pressure 136/68 pulse 75 temperature 98.1 O2 sats 98% room air  Urine output 5 L 06/08/2020   Sodium 138 potassium 3.6 chloride 90 CO2 33 BUN 87 creatinine 2.31 glucose 132 calcium 9.3 albumin 1.4 albumin 3 AST 78 ALT 64  Arimidex 1 mg daily dexamethasone 4 mg daily, folic acid 1 mg daily,   gabapentin 100 mg twice daily      Objective:  Vital signs in last 24 hours:  Temp:  [98.1 F (36.7 C)-98.3 F (36.8 C)] 98.1 F (36.7 C) (10/12 0541) Pulse Rate:  [75-88] 75 (10/12 0541) Resp:  [15-20] 19 (10/12 0541) BP: (125-141)/(68-77) 136/68 (10/12 0541) SpO2:  [97 %-100 %] 98 % (10/12 0541) Weight:  [130.6 kg] 130.6 kg (10/12 0603)  Weight change: -1.5 kg Filed Weights   06/07/20 0630 06/09/20 0500 06/10/20 0603  Weight: (!) 144.3 kg 132.1 kg 130.6 kg    Intake/Output: I/O last 3 completed shifts: In: 812 [P.O.:680; IV Piggyback:132] Out: 4163 [AGTXM:4680; Stool:1]   Intake/Output this shift:  No intake/output data recorded.  alert, nad , morbidly obese  no jvd  Chest rales L base, poor resp effort  Cor reg no RG  Abd soft ntnd no ascites,  pannus     Ext no edema noted  On exam   Alert, NF, ox3, deconditioned   Basic Metabolic Panel: Recent Labs  Lab 06/06/20 0442 06/06/20 0442 06/07/20 0608 06/07/20 0608 06/08/20 0547 06/09/20 0558 06/10/20 0513  NA 126*  --  131*  --  129* 132* 138  K 3.9  --  3.9  --  3.7 3.6 3.6  CL 93*  --  93*  --  89* 90* 90*  CO2 23  --  27  --   27 30 33*  GLUCOSE 126*  --  125*  --  121* 114* 132*  BUN 78*  --  80*  --  81* 83* 87*  CREATININE 3.25*  --  2.80*  --  2.58* 2.30* 2.31*  CALCIUM 9.1   < > 9.7   < > 9.4 9.4 9.3  MG  --   --   --   --   --   --  1.4*   < > = values in this interval not displayed.    Liver Function Tests: Recent Labs  Lab 06/09/20 0558 06/10/20 0513  AST 74* 78*  ALT 57* 64*  ALKPHOS 54 53  BILITOT 0.8 0.7  PROT 6.6 6.5  ALBUMIN 3.0* 3.0*   No results for input(s): LIPASE, AMYLASE in the last 168 hours. No results for input(s): AMMONIA in the last 168 hours.  CBC: Recent Labs  Lab 06/04/20 0503 06/05/20 0532 06/08/20 0547 06/09/20 0558 06/10/20 0513  WBC 5.3 6.0 6.0 4.7 5.0  HGB 7.4* 7.2* 7.1* 7.4* 7.6*  HCT 20.4* 19.7* 20.8* 21.3* 21.5*  MCV 93.6 93.4 96.3 95.5 95.6  PLT 134* 138* 179 192 196    Cardiac Enzymes: No results for input(s): CKTOTAL, CKMB, CKMBINDEX, TROPONINI in the last 168  hours.  BNP: Invalid input(s): POCBNP  CBG: No results for input(s): GLUCAP in the last 168 hours.  Microbiology: Results for orders placed or performed during the hospital encounter of 05/31/20  Respiratory Panel by RT PCR (Flu A&B, Covid) - Nasopharyngeal Swab     Status: None   Collection Time: 05/31/20  7:23 PM   Specimen: Nasopharyngeal Swab  Result Value Ref Range Status   SARS Coronavirus 2 by RT PCR NEGATIVE NEGATIVE Final    Comment: (NOTE) SARS-CoV-2 target nucleic acids are NOT DETECTED.  The SARS-CoV-2 RNA is generally detectable in upper respiratoy specimens during the acute phase of infection. The lowest concentration of SARS-CoV-2 viral copies this assay can detect is 131 copies/mL. A negative result does not preclude SARS-Cov-2 infection and should not be used as the sole basis for treatment or other patient management decisions. A negative result may occur with  improper specimen collection/handling, submission of specimen other than nasopharyngeal swab, presence  of viral mutation(s) within the areas targeted by this assay, and inadequate number of viral copies (<131 copies/mL). A negative result must be combined with clinical observations, patient history, and epidemiological information. The expected result is Negative.  Fact Sheet for Patients:  PinkCheek.be  Fact Sheet for Healthcare Providers:  GravelBags.it  This test is no t yet approved or cleared by the [place] FDA and  has been authorized for detection and/or diagnosis of SARS-CoV-2 by FDA under an Emergency Use Authorization (EUA). This EUA will remain  in effect (meaning this test can be used) for the duration of the COVID-19 declaration under Section 564(b)(1) of the Act, 21 U.S.C. section 360bbb-3(b)(1), unless the authorization is terminated or revoked sooner.     Influenza A by PCR NEGATIVE NEGATIVE Final   Influenza B by PCR NEGATIVE NEGATIVE Final    Comment: (NOTE) The Xpert Xpress SARS-CoV-2/FLU/RSV assay is intended as an aid in  the diagnosis of influenza from Nasopharyngeal swab specimens and  should not be used as a sole basis for treatment. Nasal washings and  aspirates are unacceptable for Xpert Xpress SARS-CoV-2/FLU/RSV  testing.  Fact Sheet for Patients: PinkCheek.be  Fact Sheet for Healthcare Providers: GravelBags.it  This test is not yet approved or cleared by the [place] FDA and  has been authorized for detection and/or diagnosis of SARS-CoV-2 by  FDA under an Emergency Use Authorization (EUA). This EUA will remain  in effect (meaning this test can be used) for the duration of the  Covid-19 declaration under Section 564(b)(1) of the Act, 21  U.S.C. section 360bbb-3(b)(1), unless the authorization is  terminated or revoked. Performed at [facility], [place] [address], [place], [place] [address]      Coagulation Studies: No results for input(s): LABPROT, INR in the last 72 hours.  Urinalysis: No results for input(s): COLORURINE, LABSPEC, PHURINE, GLUCOSEU, HGBUR, BILIRUBINUR, KETONESUR, PROTEINUR, UROBILINOGEN, NITRITE, LEUKOCYTESUR in the last 72 hours.  Invalid input(s): APPERANCEUR    Imaging: No results found.   Medications:   . magnesium sulfate bolus IVPB     . anastrozole  1 mg Oral Daily  . Chlorhexidine Gluconate Cloth  6 each Topical Daily  . dexamethasone  4 mg Oral Daily  . docusate sodium  100 mg Oral BID  . feeding supplement (ENSURE ENLIVE)  237 mL Oral BID BM  . feeding supplement (KATE FARMS STANDARD 1.4)  325 mL Oral BID BM  . folic acid  1 mg Oral Daily  . gabapentin  100 mg  Oral BID  . heparin  5,000 Units Subcutaneous Q8H  . mineral oil-hydrophilic petrolatum   Topical BID  . multivitamin with minerals  1 tablet Oral Daily  . nystatin   Topical TID  . potassium chloride  40 mEq Oral Once  . sodium chloride flush  3 mL Intravenous Q12H  . thiamine  100 mg Oral Daily   Or  . thiamine  100 mg Intravenous Daily   acetaminophen **OR** acetaminophen, HYDROcodone-acetaminophen, ondansetron **OR** ondansetron (ZOFRAN) IV, polyethylene glycol, sodium chloride flush  Assessment/ Plan:  1. AoCKD 3b - b/l creat 1.2- 1.7, eGFR 35- 55 ml/min. Suspect AKI due to nsaids + cardiorenal syndrome w/ acute on chronic diastolic CHF.  BP's high on presentation w/ sig vol overload/ anasarca and low UNa. No obstruction on [place]. CXR clear from 10/02. UPC ratio 558m and UA negative.   Appears to be auto diuresing with 4 L output 06/10/1999   2. Hyponatremia - hypervolemic w/ edematous state. Na+ up 129- 132 range now.  Appears resolved 3. Hx breast cancer - failed regular chemo d/t side effects, now on hormonal Rx. Finished radiation a few weeks ago, also had L mastectomy.  4. Anemia/ pancytopenia - not sure cause, hx prior chemoRx.  SPEP negative for M-spike.  FLC ratio  up slightly, doubt clinically significant.  5. HTN - bp's down yest, coreg dc'd, BP's  better  6. Morbid obesity - BMI 60.  Obesity is driving all of this including diast HF/ vol overload and renal failure.    LOS: 1[place]'@TODAY' '@7' :54 AM

## 2020-06-10 NOTE — Progress Notes (Signed)
PROGRESS NOTE    [name]  EYC:[id number] DOB: 04/02/1956 DOA: 05/31/2020 PCP: [name], MD    Brief Narrative:  64 y.o.femalewith medical history significant of breast CA sp Mastectomy and radiation on the left,morbid obesity, HTN, multiple skin ulcers, Alcohol liver disease CKD stage IIIa, Grade 1 diastolic CHF.  Presented withbilateral leg swelling and burning on the bottom.  She has been urinating a bit more than usual.  Reports she drinks 4 bottle of water a day and ensure.  She has no appetite and has not been eating.  She gained fluid weight.  Reports her ulcers have been healing.  She can walk a bit uses wheelchair.  She still drinks few mixed drinks a day 2-3/day no hx of withdraw.  Last left breast radiation therapy was 3 days ago  Reports feet are burning.  Hurts to stand up.  No diarrhea no melena no blood in stool.  Work up revealed No evidence of DVT, Hyponatremia and acute on chronic CKD3B.  TRH was asked to admit.  Nephrology was consulted and is following.  Assessment & Plan:   Active Problems:   Essential hypertension   Alcohol dependence ([place])   Edema   Malignant neoplasm of upper-outer quadrant of left breast in female, estrogen receptor positive ([place])   Morbid obesity with BMI of 60.0-69.9, adult (HCC)   Acute renal failure (ARF) (HCC)   Thrombocytopenia (HCC)   Debility   Anemia of chronic disease   Chronic kidney disease, stage 3a (HCC)   Hyponatremia   Pancytopenia (HCC)   Breast cancer (HCC)  Hypervolemic hyponatremia Presented with serum sodium 119, low serum osmolality, hypervolemic on exam Pt was continued on lasix gtt with metolazone added 10/8. Pt now auto-diuresing well Sodium now resolved at [place] in AM  Improving, nonoliguric AKI on CKD 3B Baseline creatinine noted to be around 2.7 Presented with creatinine of greater than 4 with GFR of 12 Creatinine now much improved at 2.31 Continue to avoid nephrotoxins and  hypotension Nephrology following. Initially required lasix gtt with metolazone Now auto-diuresing well without diuretics Repeat bmet in AM  Anemia of chronic disease secondary to CKD No overt bleeding Hgb stable at 7.6 Repeat cbc in AM Earlier discussed with Oncology. Pt may benefit from GI follow up after d/c Iron level normal at 130  Leukopenia/thrombocytopenia likely secondary to acute illness WBC normal at 5.0 Repeat cbc in AM  Resolved post repletion: Hypokalemia Remains within normal limits Recheck bmet in AM  Isolated hyperbilirubinemia T bili 1.3 on 06/01/2020 Suspect element of hepatic congestion related to below acute CHF  Acute on Chronic diastolic CHF Last 2D echo done on 02/11/2020 showed LVEF 70 to 75% and grade 1 diastolic dysfunction Repeat 2D echo reviewed. Findings of EF 14-97%WYOV grade 2 diastolic dysfunction Continue strict I/O's Had discussed case with Cardiology who agreed with diuresis while in hospital. Cardiology recommends establishing follow up with Cardiology when pt is ultimately discharged. Have submitted electronic request to Cardiology to establish care after d/c  Physical debility PT recs for supervision for mobility/oob, HHPT Fall precautions  History of breast cancer/radiation Last radiation 3 days prior to presentation affecting left breast, Nodes noted on exam, POA Wound care specialist for local wound care recommendations Continue local wound care as toleraetd  Severe morbid obesity BMI 54.4 Recommend diet/lifestyle modification Will benefit from referral to bariatric weight loss center  DVT prophylaxis: Heparin subq Code Status: Full Family Communication: Pt in room, family not at bedside  Status is: Inpatient  Remains inpatient appropriate because:Hemodynamically unstable, Ongoing diagnostic testing needed not appropriate for outpatient work up, Unsafe d/c plan and IV treatments appropriate due to intensity of illness  or inability to take PO  Dispo:  Patient From: Home  Planned Disposition: Home with Health Care Svc  Expected discharge date: 06/11/20  Medically stable for discharge: No   Consultants:   Nephrology  Hematology  Procedures:     Antimicrobials: Anti-infectives (From admission, onward)   None      Subjective: Reports swelling is improving  Objective: Vitals:   06/09/20 1611 06/09/20 2151 06/10/20 0541 06/10/20 0603  BP: 125/77 (!) 141/74 136/68   Pulse: 79 88 75   Resp: 15 20 19    Temp: 98.3 F (36.8 C) 98.1 F (36.7 C) 98.1 F (36.7 C)   TempSrc: Oral Oral Oral   SpO2: 100% 97% 98%   Weight:    130.6 kg  Height:        Intake/Output Summary (Last 24 hours) at 06/10/2020 1455 Last data filed at 06/10/2020 0645 Gross per 24 hour  Intake 0 ml  Output 2125 ml  Net -2125 ml   Filed Weights   06/07/20 0630 06/09/20 0500 06/10/20 0603  Weight: (!) 144.3 kg 132.1 kg 130.6 kg    Examination: General exam: Conversant, in no acute distress Respiratory system: normal chest rise, clear, no audible wheezing Cardiovascular system: regular rhythm, s1-s2 Gastrointestinal system: Nondistended, nontender, pos BS Central nervous system: No seizures, no tremors Extremities: No cyanosis, no joint deformities, BLE edema much improved Skin: No rashes, no pallor Psychiatry: Affect normal // no auditory hallucinations   Data Reviewed: I have personally reviewed following labs and imaging studies  CBC: Recent Labs  Lab 06/04/20 0503 06/05/20 0532 06/08/20 0547 06/09/20 0558 06/10/20 0513  WBC 5.3 6.0 6.0 4.7 5.0  HGB 7.4* 7.2* 7.1* 7.4* 7.6*  HCT 20.4* 19.7* 20.8* 21.3* 21.5*  MCV 93.6 93.4 96.3 95.5 95.6  PLT 134* 138* 179 192 659   Basic Metabolic Panel: Recent Labs  Lab 06/06/20 0442 06/07/20 0608 06/08/20 0547 06/09/20 0558 06/10/20 0513  NA 126* 131* 129* 132* 138  K 3.9 3.9 3.7 3.6 3.6  CL 93* 93* 89* 90* 90*  CO2 23 27 27 30  33*  GLUCOSE 126*  125* 121* 114* 132*  BUN 78* 80* 81* 83* 87*  CREATININE 3.25* 2.80* 2.58* 2.30* 2.31*  CALCIUM 9.1 9.7 9.4 9.4 9.3  MG  --   --   --   --  1.4*   GFR: Estimated Creatinine Clearance: 31.4 mL/min (A) (by C-G formula based on SCr of 2.31 mg/dL (H)). Liver Function Tests: Recent Labs  Lab 06/09/20 0558 06/10/20 0513  AST 74* 78*  ALT 57* 64*  ALKPHOS 54 53  BILITOT 0.8 0.7  PROT 6.6 6.5  ALBUMIN 3.0* 3.0*   No results for input(s): LIPASE, AMYLASE in the last 168 hours. No results for input(s): AMMONIA in the last 168 hours. Coagulation Profile: No results for input(s): INR, PROTIME in the last 168 hours. Cardiac Enzymes: No results for input(s): CKTOTAL, CKMB, CKMBINDEX, TROPONINI in the last 168 hours. BNP (last 3 results) No results for input(s): PROBNP in the last 8760 hours. HbA1C: No results for input(s): HGBA1C in the last 72 hours. CBG: No results for input(s): GLUCAP in the last 168 hours. Lipid Profile: No results for input(s): CHOL, HDL, LDLCALC, TRIG, CHOLHDL, LDLDIRECT in the last 72 hours. Thyroid Function Tests: No results for input(s):  TSH, T4TOTAL, FREET4, T3FREE, THYROIDAB in the last 72 hours. Anemia Panel: No results for input(s): VITAMINB12, FOLATE, FERRITIN, TIBC, IRON, RETICCTPCT in the last 72 hours. Sepsis Labs: No results for input(s): PROCALCITON, LATICACIDVEN in the last 168 hours.  Recent Results (from the past 240 hour(s))  Respiratory Panel by RT PCR (Flu A&B, Covid) - Nasopharyngeal Swab     Status: None   Collection Time: 05/31/20  7:23 PM   Specimen: Nasopharyngeal Swab  Result Value Ref Range Status   SARS Coronavirus 2 by RT PCR NEGATIVE NEGATIVE Final    Comment: (NOTE) SARS-CoV-2 target nucleic acids are NOT DETECTED.  The SARS-CoV-2 RNA is generally detectable in upper respiratoy specimens during the acute phase of infection. The lowest concentration of SARS-CoV-2 viral copies this assay can detect is 131 copies/mL. A negative  result does not preclude SARS-Cov-2 infection and should not be used as the sole basis for treatment or other patient management decisions. A negative result may occur with  improper specimen collection/handling, submission of specimen other than nasopharyngeal swab, presence of viral mutation(s) within the areas targeted by this assay, and inadequate number of viral copies (<131 copies/mL). A negative result must be combined with clinical observations, patient history, and epidemiological information. The expected result is Negative.  Fact Sheet for Patients:  PinkCheek.be  Fact Sheet for Healthcare Providers:  GravelBags.it  This test is no t yet approved or cleared by the [place] FDA and  has been authorized for detection and/or diagnosis of SARS-CoV-2 by FDA under an Emergency Use Authorization (EUA). This EUA will remain  in effect (meaning this test can be used) for the duration of the COVID-19 declaration under Section 564(b)(1) of the Act, 21 U.S.C. section 360bbb-3(b)(1), unless the authorization is terminated or revoked sooner.     Influenza A by PCR NEGATIVE NEGATIVE Final   Influenza B by PCR NEGATIVE NEGATIVE Final    Comment: (NOTE) The Xpert Xpress SARS-CoV-2/FLU/RSV assay is intended as an aid in  the diagnosis of influenza from Nasopharyngeal swab specimens and  should not be used as a sole basis for treatment. Nasal washings and  aspirates are unacceptable for Xpert Xpress SARS-CoV-2/FLU/RSV  testing.  Fact Sheet for Patients: PinkCheek.be  Fact Sheet for Healthcare Providers: GravelBags.it  This test is not yet approved or cleared by the [place] FDA and  has been authorized for detection and/or diagnosis of SARS-CoV-2 by  FDA under an Emergency Use Authorization (EUA). This EUA will remain  in effect (meaning this test can be used)  for the duration of the  Covid-19 declaration under Section 564(b)(1) of the Act, 21  U.S.C. section 360bbb-3(b)(1), unless the authorization is  terminated or revoked. Performed at [facility], [place] [address], [place], [place] [address]      Radiology Studies: No results found.  Scheduled Meds: . anastrozole  1 mg Oral Daily  . Chlorhexidine Gluconate Cloth  6 each Topical Daily  . dexamethasone  4 mg Oral Daily  . docusate sodium  100 mg Oral BID  . feeding supplement (ENSURE ENLIVE)  237 mL Oral BID BM  . feeding supplement (KATE FARMS STANDARD 1.4)  325 mL Oral BID BM  . folic acid  1 mg Oral Daily  . gabapentin  100 mg Oral BID  . heparin  5,000 Units Subcutaneous Q8H  . mineral oil-hydrophilic petrolatum   Topical BID  . multivitamin with minerals  1 tablet Oral Daily  . nystatin   Topical  TID  . sodium chloride flush  3 mL Intravenous Q12H  . thiamine  100 mg Oral Daily   Or  . thiamine  100 mg Intravenous Daily   Continuous Infusions:    LOS: 10 days   Marylu Lund, MD [facility] Pager On Amion  If 7PM-7AM, please contact night-coverage 06/10/2020, 2:55 PM

## 2020-06-10 NOTE — Progress Notes (Signed)
Nutrition Follow-up  DOCUMENTATION CODES:   Morbid obesity  INTERVENTION:   - Ensure Enlive po BID, each supplement provides 350 kcal and 20 grams of protein -[employer] 1.4 po BID, each supplement provides 455 kcal and 20 grams protein.  -Multivitamin with minerals daily  NUTRITION DIAGNOSIS:   Increased nutrient needs related to cancer and cancer related treatments as evidenced by estimated needs.  Ongoing.  GOAL:   Patient will meet greater than or equal to 90% of their needs  Progressing.  MONITOR:   PO intake, Supplement acceptance, Labs, Weight trends, Skin, I & O's  ASSESSMENT:   64 y.o. female with medical history significant of breast CA sp Mastectomy and radiation on the left, morbid obesity, HTN, multiple skin ulcers, Alcohol liver diseaseCKD stage IIIa, Grade 1 diastolic CHF.  Presented with bilateral leg swelling and burning on the bottom.  She has been urinating a bit more than usual.  Reports she drinks 4 bottle of water a day and ensure.  She has no appetite and has not been eating.  She gained fluid weight.  Reports her ulcers have been healing.  She can walk a bit uses wheelchair.  She still drinks few mixed drinks a day 2-3/day no hx of withdraw.  Last left breast radiation therapy was 3 days ago  Patient currently consuming 100% of meals at this time. Pt is also drinking protein supplements (Ensure and [employer]).   Admission weight: 310 lbs. Current weight: 287 lbs  I/Os: -19.6L since admit UOP: 3625 ml x 24 hrs  Medications: Colace, folic acid, Multivitamin with minerals daily, KLOR-CON, Thiamine, IV Mg sulfate   Labs reviewed: Low Mg  Diet Order:   Diet Order            Diet renal with fluid restriction Fluid restriction: Other (see comments); Room service appropriate? Yes; Fluid consistency: Thin  Diet effective now                 EDUCATION NEEDS:   Education needs have been addressed  Skin:  Skin Assessment: Skin Integrity  Issues: Skin Integrity Issues:: Other (Comment) Other: Intertriginous dermatitis -per WOC note  Last BM:  10/10- type 6  Height:   Ht Readings from Last 1 Encounters:  06/02/20 5\' 1"  (1.549 m)    Weight:   Wt Readings from Last 1 Encounters:  06/10/20 130.6 kg    BMI:  Body mass index is 54.4 kg/m.  Estimated Nutritional Needs:   Kcal:  1900-2100  Protein:  70-85g  Fluid:  1.9L/day  Clayton Bibles, MS, RD, LDN Inpatient Clinical Dietitian Contact information available via Amion

## 2020-06-10 NOTE — Plan of Care (Signed)
Pt VS WNL this am.  No complaints at this time.   Problem: Education: Goal: Knowledge of General Education information will improve Description: Including pain rating scale, medication(s)/side effects and non-pharmacologic comfort measures Outcome: Progressing   Problem: Health Behavior/Discharge Planning: Goal: Ability to manage health-related needs will improve Outcome: Progressing   Problem: Clinical Measurements: Goal: Ability to maintain clinical measurements within normal limits will improve Outcome: Progressing Goal: Will remain free from infection Outcome: Progressing Goal: Diagnostic test results will improve Outcome: Progressing Goal: Respiratory complications will improve Outcome: Progressing Goal: Cardiovascular complication will be avoided Outcome: Progressing   Problem: Activity: Goal: Risk for activity intolerance will decrease Outcome: Progressing   Problem: Nutrition: Goal: Adequate nutrition will be maintained Outcome: Progressing   Problem: Coping: Goal: Level of anxiety will decrease Outcome: Progressing   Problem: Elimination: Goal: Will not experience complications related to bowel motility Outcome: Progressing Goal: Will not experience complications related to urinary retention Outcome: Progressing   Problem: Pain Managment: Goal: General experience of comfort will improve Outcome: Progressing   Problem: Safety: Goal: Ability to remain free from injury will improve Outcome: Progressing   Problem: Skin Integrity: Goal: Risk for impaired skin integrity will decrease Outcome: Progressing   

## 2020-06-10 NOTE — Progress Notes (Signed)
CRITICAL VALUE ALERT  Critical Value: Mg-1.4 from 1.9  Date & Time Notied:  06/10/2020 3291  Provider Notified: MD Sharlet Salina   Orders Received/Actions taken: Awaiting orders if any

## 2020-06-11 ENCOUNTER — Other Ambulatory Visit: Payer: Self-pay

## 2020-06-11 DIAGNOSIS — N179 Acute kidney failure, unspecified: Secondary | ICD-10-CM | POA: Diagnosis not present

## 2020-06-11 LAB — COMPREHENSIVE METABOLIC PANEL
ALT: 80 U/L — ABNORMAL HIGH (ref 0–44)
AST: 97 U/L — ABNORMAL HIGH (ref 15–41)
Albumin: 3 g/dL — ABNORMAL LOW (ref 3.5–5.0)
Alkaline Phosphatase: 52 U/L (ref 38–126)
Anion gap: 9 (ref 5–15)
BUN: 87 mg/dL — ABNORMAL HIGH (ref 8–23)
CO2: 34 mmol/L — ABNORMAL HIGH (ref 22–32)
Calcium: 9.2 mg/dL (ref 8.9–10.3)
Chloride: 93 mmol/L — ABNORMAL LOW (ref 98–111)
Creatinine, Ser: 2.07 mg/dL — ABNORMAL HIGH (ref 0.44–1.00)
GFR, Estimated: 25 mL/min — ABNORMAL LOW (ref >60)
Glucose, Bld: 135 mg/dL — ABNORMAL HIGH (ref 70–99)
Potassium: 4.2 mmol/L (ref 3.5–5.1)
Sodium: 136 mmol/L (ref 135–145)
Total Bilirubin: 0.8 mg/dL (ref 0.3–1.2)
Total Protein: 6.5 g/dL (ref 6.5–8.1)

## 2020-06-11 LAB — MAGNESIUM: Magnesium: 2.2 mg/dL (ref 1.7–2.4)

## 2020-06-11 NOTE — Progress Notes (Signed)
[facility] ROUNDING NOTE   Subjective:   Brief history: 64 year old lady with a history of breast CA status post mastectomy and radiation on the left morbid obesity hypertension multiple skin ulcers and alcoholic liver disease.  She has grade 1 diastolic dysfunction she presented with lower extremity edema.  She was admitted also with acute on chronic kidney disease.  Baseline creatinine is 1.2 to 1.7 mg/dL.  With diuresis it appears that her creatinine has improved.  Blood pressure 150/73 pulse 70 temperature 98.4 O2 sats 90% room air  Urine output 1.5 L 06/10/2020 weight decreased to 130 kg from 144 kg on admission   Sodium 136 potassium 4.2 chloride 93 CO2 34 BUN 87 creatinine 2.1 glucose 135 calcium 9.3 magnesium 2.2 albumin 3.0 hemoglobin 7.6  Arimidex 1 mg daily dexamethasone 4 mg daily, folic acid 1 mg daily,   gabapentin 100 mg twice daily      Objective:  Vital signs in last 24 hours:  Temp:  [98 F (36.7 C)-98.5 F (36.9 C)] 98.4 F (36.9 C) (10/13 0451) Pulse Rate:  [70-79] 70 (10/13 0451) Resp:  [16-20] 16 (10/13 0451) BP: (120-150)/(57-75) 150/73 (10/13 0451) SpO2:  [98 %-100 %] 100 % (10/13 0451) Weight:  [130.4 kg] 130.4 kg (10/13 0451)  Weight change: -0.2 kg Filed Weights   06/09/20 0500 06/10/20 0603 06/11/20 0451  Weight: 132.1 kg 130.6 kg 130.4 kg    Intake/Output: I/O last 3 completed shifts: In: 440 [P.O.:440] Out: 2425 [Urine:2425]   Intake/Output this shift:  No intake/output data recorded.  alert, nad , morbidly obese  no jvd  Chest rales L base, poor resp effort  Cor reg no RG  Abd soft ntnd no ascites,  pannus     Ext no edema noted  On exam   Alert, NF, ox3, deconditioned   Basic Metabolic Panel: Recent Labs  Lab 06/07/20 0608 06/07/20 0608 06/08/20 0547 06/08/20 0547 06/09/20 0558 06/10/20 0513 06/11/20 0554  NA 131*  --  129*  --  132* 138 136  K 3.9  --  3.7  --  3.6 3.6 4.2  CL 93*  --  89*  --  90* 90*  93*  CO2 27  --  27  --  30 33* 34*  GLUCOSE 125*  --  121*  --  114* 132* 135*  BUN 80*  --  81*  --  83* 87* 87*  CREATININE 2.80*  --  2.58*  --  2.30* 2.31* 2.07*  CALCIUM 9.7   < > 9.4   < > 9.4 9.3 9.2  MG  --   --   --   --   --  1.4* 2.2   < > = values in this interval not displayed.    Liver Function Tests: Recent Labs  Lab 06/09/20 0558 06/10/20 0513 06/11/20 0554  AST 74* 78* 97*  ALT 57* 64* 80*  ALKPHOS 54 53 52  BILITOT 0.8 0.7 0.8  PROT 6.6 6.5 6.5  ALBUMIN 3.0* 3.0* 3.0*   No results for input(s): LIPASE, AMYLASE in the last 168 hours. No results for input(s): AMMONIA in the last 168 hours.  CBC: Recent Labs  Lab 06/05/20 0532 06/08/20 0547 06/09/20 0558 06/10/20 0513  WBC 6.0 6.0 4.7 5.0  HGB 7.2* 7.1* 7.4* 7.6*  HCT 19.7* 20.8* 21.3* 21.5*  MCV 93.4 96.3 95.5 95.6  PLT 138* 179 192 196    Cardiac Enzymes: No results for input(s): CKTOTAL, CKMB, CKMBINDEX, TROPONINI in  the last 168 hours.  BNP: Invalid input(s): POCBNP  CBG: No results for input(s): GLUCAP in the last 168 hours.  Microbiology: Results for orders placed or performed during the hospital encounter of 05/31/20  Respiratory Panel by RT PCR (Flu A&B, Covid) - Nasopharyngeal Swab     Status: None   Collection Time: 05/31/20  7:23 PM   Specimen: Nasopharyngeal Swab  Result Value Ref Range Status   SARS Coronavirus 2 by RT PCR NEGATIVE NEGATIVE Final    Comment: (NOTE) SARS-CoV-2 target nucleic acids are NOT DETECTED.  The SARS-CoV-2 RNA is generally detectable in upper respiratoy specimens during the acute phase of infection. The lowest concentration of SARS-CoV-2 viral copies this assay can detect is 131 copies/mL. A negative result does not preclude SARS-Cov-2 infection and should not be used as the sole basis for treatment or other patient management decisions. A negative result may occur with  improper specimen collection/handling, submission of specimen other than  nasopharyngeal swab, presence of viral mutation(s) within the areas targeted by this assay, and inadequate number of viral copies (<131 copies/mL). A negative result must be combined with clinical observations, patient history, and epidemiological information. The expected result is Negative.  Fact Sheet for Patients:  PinkCheek.be  Fact Sheet for Healthcare Providers:  GravelBags.it  This test is no t yet approved or cleared by the [place] FDA and  has been authorized for detection and/or diagnosis of SARS-CoV-2 by FDA under an Emergency Use Authorization (EUA). This EUA will remain  in effect (meaning this test can be used) for the duration of the COVID-19 declaration under Section 564(b)(1) of the Act, 21 U.S.C. section 360bbb-3(b)(1), unless the authorization is terminated or revoked sooner.     Influenza A by PCR NEGATIVE NEGATIVE Final   Influenza B by PCR NEGATIVE NEGATIVE Final    Comment: (NOTE) The Xpert Xpress SARS-CoV-2/FLU/RSV assay is intended as an aid in  the diagnosis of influenza from Nasopharyngeal swab specimens and  should not be used as a sole basis for treatment. Nasal washings and  aspirates are unacceptable for Xpert Xpress SARS-CoV-2/FLU/RSV  testing.  Fact Sheet for Patients: PinkCheek.be  Fact Sheet for Healthcare Providers: GravelBags.it  This test is not yet approved or cleared by the [place] FDA and  has been authorized for detection and/or diagnosis of SARS-CoV-2 by  FDA under an Emergency Use Authorization (EUA). This EUA will remain  in effect (meaning this test can be used) for the duration of the  Covid-19 declaration under Section 564(b)(1) of the Act, 21  U.S.C. section 360bbb-3(b)(1), unless the authorization is  terminated or revoked. Performed at [facility],  [address], [place], [place] [address]     Coagulation Studies: No results for input(s): LABPROT, INR in the last 72 hours.  Urinalysis: No results for input(s): COLORURINE, LABSPEC, PHURINE, GLUCOSEU, HGBUR, BILIRUBINUR, KETONESUR, PROTEINUR, UROBILINOGEN, NITRITE, LEUKOCYTESUR in the last 72 hours.  Invalid input(s): APPERANCEUR    Imaging: No results found.   Medications:    . anastrozole  1 mg Oral Daily  . Chlorhexidine Gluconate Cloth  6 each Topical Daily  . dexamethasone  4 mg Oral Daily  . docusate sodium  100 mg Oral BID  . feeding supplement (ENSURE ENLIVE)  237 mL Oral BID BM  . feeding supplement (KATE FARMS STANDARD 1.4)  325 mL Oral BID BM  . folic acid  1 mg Oral Daily  . gabapentin  100 mg Oral BID  . heparin  5,000 Units Subcutaneous Q8H  . mineral oil-hydrophilic petrolatum   Topical BID  . multivitamin with minerals  1 tablet Oral Daily  . nystatin   Topical TID  . sodium chloride flush  3 mL Intravenous Q12H  . thiamine  100 mg Oral Daily   Or  . thiamine  100 mg Intravenous Daily   acetaminophen **OR** acetaminophen, HYDROcodone-acetaminophen, ondansetron **OR** ondansetron (ZOFRAN) IV, polyethylene glycol, sodium chloride flush  Assessment/ Plan:  1. AoCKD 3b - b/l creat 1.2- 1.7, eGFR 35- 55 ml/min. Suspect AKI due to nsaids + cardiorenal syndrome w/ acute on chronic diastolic CHF.  BP's high on presentation w/ sig vol overload/ anasarca and low UNa. No obstruction on [place]. CXR clear from 10/02. UPC ratio 557m and UA negative.   Has good urine output.  Renal function approaching baseline.  Certainly improved.  We will continue to follow. 2. Hyponatremia - hypervolemic w/ edematous state. Na+ up 129- 132 range now.  Appears resolved 3. Hx breast cancer - failed regular chemo d/t side effects, now on hormonal Rx. Finished radiation a few weeks ago, also had L mastectomy.  4. Anemia/ pancytopenia - not sure cause, hx prior chemoRx.  SPEP negative for M-spike.  FLC  ratio up slightly, doubt clinically significant.  5. HTN - bp's down yest, coreg dc'd, BP's  better  6. Morbid obesity - BMI 60.  Obesity is driving all of this including diast HF/ vol overload and renal failure.    LOS: 1Pinal_0 _1 :22 AM

## 2020-06-11 NOTE — Progress Notes (Signed)
PROGRESS NOTE   [name]  GQB:[id number]    DOB: 1956/02/17    DOA: 05/31/2020  PCP: [name], MD   I have briefly reviewed patients previous medical records in [facility].  Chief Complaint  Patient presents with  . Leg Swelling    Brief Narrative:  64 year old female with PMH of left breast cancer s/p mastectomy and radiation, morbid obesity, hypertension, multiple skin ulcers, alcoholic liver disease, CKD stage IIIa, chronic diastolic CHF presented with bilateral leg swelling and burning on the bottom of her feet.  Work-up revealed no evidence of DVT, but had hyponatremia and acute on chronic kidney disease.  Nephrology consulting.  Slowly improving.   Assessment & Plan:  Active Problems:   Essential hypertension   Alcohol dependence ([place])   Edema   Malignant neoplasm of upper-outer quadrant of left breast in female, estrogen receptor positive ([place])   Morbid obesity with BMI of 60.0-69.9, adult (HCC)   Acute renal failure (ARF) (HCC)   Thrombocytopenia (HCC)   Debility   Anemia of chronic disease   Chronic kidney disease, stage 3a (HCC)   Hyponatremia   Pancytopenia (HCC)   Breast cancer (HCC)   Hypervolemic hyponatremia Presented with serum sodium 119, low serum osmolality, hypervolemic on exam Treated with lasix gtt and metolazone, both of them since discontinued. Pt now auto-diuresing well Resolved.  Acute kidney injury complicating stage IIIb CKD. Creatinine normal, 1.05-1.07 in early September 2021. Presented with creatinine of 4.49 Nephrology consulted and following. AKI felt to be due to ATN, NSAIDs, cardiorenal syndrome with acute on chronic diastolic CHF.  Patient had significantly high blood pressures on presentation with significant volume overload/anasarca and low urine sodium. No obstruction on renal ultrasound. Diuretics discontinued.  Patient now auto diuresing and creatinine steadily improving. Continue to avoid nephrotoxic's and  hypotension. Foley catheter discontinued 10/13. I discussed with Dr. Justin Mend on 10/13 who recommended monitoring BMP closely for additional 24 to 48 hours and if continues to make consistent improvement then likely DC home on prior home dose of Lasix 20 mg every other day.  Anemia of chronic disease secondary to CKD No overt bleeding Hgb stable in the 7 g range. Prior hospitalist discussed with Oncology. Pt may benefit from GI follow up after d/c Iron level normal at 130  Leukopenia/thrombocytopenia likely secondary to acute illness Both resolved.  Hypokalemia/hypomagnesemia Resolved.  Abnormal LFTs/mild transaminitis/isolated hyperbilirubinemia May be due to passive hepatic congestion from acute CHF.  Bilirubin has normalized.  AST and ALT stable.  Acute on Chronic diastolic CHF Last 2D echo done on 02/11/2020 showed LVEF 70 to 75% and grade 1 diastolic dysfunction Repeat 2D echo reviewed. Findings of EF 82-80%KLKJ grade 2 diastolic dysfunction Continue strict I/O's Prior hospitalist discussed case with Cardiology who agreed with diuresis while in hospital. Cardiology recommends establishing follow up with Cardiology when pt is ultimately discharged.  Prior hospitalist submitted electronic request to Cardiology to establish care after d/c Clinically now euvolemic and also auto diuresing.  Physical debility PT recs for supervision for mobility/oob, HHPT Fall precautions  History of breast cancer/radiation Last radiation 3 days prior to presentation affecting left breast, Nodes noted on exam, POA Wound care specialist for local wound care recommendations Continue local wound care as toleraetd  Body mass index is 54.32 kg/m./Severe morbid obesity: Recommended diet/lifestyle modification.  Not sure she will be a candidate for bariatric surgery.  Outpatient follow-up.  Nutritional Status Nutrition Problem: Increased nutrient needs Etiology: cancer and cancer related  treatments Signs/Symptoms:  estimated needs Interventions: Ensure Enlive (each supplement provides 350kcal and 20 grams of protein), MVI [employer] [employer])  DVT prophylaxis: heparin injection 5,000 Units Start: 06/01/20 0600     Code Status: Full Code  Family Communication: None at bedside Disposition:  Status is: Inpatient  Remains inpatient appropriate because:Inpatient level of care appropriate due to severity of illness   Dispo:  Patient From: Home  Planned Disposition: Home with Health Care Svc  Expected discharge date: 06/11/20  Medically stable for discharge: No         Consultants:   Nephrology  Procedures:   Foley catheter discontinued 10/13  Antimicrobials:    Anti-infectives (From admission, onward)   None        Subjective:  Patient reports that she lives at her home along with her 101 year old granddaughter who is with her 24/7.  She ambulates with the help of walker.  She states that she feels fine.  Denies complaints.  No nausea, vomiting, dyspnea, chest pain, abdominal pain.  As per RN, after discontinuing Foley catheter today, able to void without difficulty and bladder scan negative.  Having BMs.  Objective:   Vitals:   06/10/20 0603 06/10/20 1457 06/10/20 2341 06/11/20 0451  BP:  (!) 120/57 (!) 141/75 (!) 150/73  Pulse:  78 79 70  Resp:  16 20 16   Temp:  98 F (36.7 C) 98.5 F (36.9 C) 98.4 F (36.9 C)  TempSrc:  Oral Oral Oral  SpO2:  98% 100% 100%  Weight: 130.6 kg   130.4 kg  Height:        General exam: Pleasant middle-aged female, moderately built and morbidly obese lying comfortably supine in bed without distress. Respiratory system: Clear to auscultation. Respiratory effort normal.  Right upper anterior chest has Port-A-Cath. Cardiovascular system: S1 & S2 heard, RRR. No JVD, murmurs, rubs, gallops or clicks. No pedal edema.  Telemetry personally reviewed: Sinus rhythm, low voltage. Gastrointestinal system: Abdomen is nondistended, soft  and nontender. No organomegaly or masses felt. Normal bowel sounds heard. Central nervous system: Alert and oriented. No focal neurological deficits. Extremities: Symmetric 5 x 5 power. Skin: No rashes, lesions or ulcers Psychiatry: Judgement and insight appear normal. Mood & affect appropriate.     Data Reviewed:   I have personally reviewed following labs and imaging studies   CBC: Recent Labs  Lab 06/08/20 0547 06/09/20 0558 06/10/20 0513  WBC 6.0 4.7 5.0  HGB 7.1* 7.4* 7.6*  HCT 20.8* 21.3* 21.5*  MCV 96.3 95.5 95.6  PLT 179 192 846    Basic Metabolic Panel: Recent Labs  Lab 06/09/20 0558 06/10/20 0513 06/11/20 0554  NA 132* 138 136  K 3.6 3.6 4.2  CL 90* 90* 93*  CO2 30 33* 34*  GLUCOSE 114* 132* 135*  BUN 83* 87* 87*  CREATININE 2.30* 2.31* 2.07*  CALCIUM 9.4 9.3 9.2  MG  --  1.4* 2.2    Liver Function Tests: Recent Labs  Lab 06/09/20 0558 06/10/20 0513 06/11/20 0554  AST 74* 78* 97*  ALT 57* 64* 80*  ALKPHOS 54 53 52  BILITOT 0.8 0.7 0.8  PROT 6.6 6.5 6.5  ALBUMIN 3.0* 3.0* 3.0*    CBG: No results for input(s): GLUCAP in the last 168 hours.  Microbiology Studies:  No results found for this or any previous visit (from the past 240 hour(s)).   Radiology Studies:  No results found.   Scheduled Meds:   . anastrozole  1 mg Oral Daily  . Chlorhexidine  Gluconate Cloth  6 each Topical Daily  . dexamethasone  4 mg Oral Daily  . docusate sodium  100 mg Oral BID  . feeding supplement  237 mL Oral BID BM  . feeding supplement (KATE FARMS STANDARD 1.4)  325 mL Oral BID BM  . folic acid  1 mg Oral Daily  . gabapentin  100 mg Oral BID  . heparin  5,000 Units Subcutaneous Q8H  . mineral oil-hydrophilic petrolatum   Topical BID  . multivitamin with minerals  1 tablet Oral Daily  . nystatin   Topical TID  . sodium chloride flush  3 mL Intravenous Q12H  . thiamine  100 mg Oral Daily   Or  . thiamine  100 mg Intravenous Daily    Continuous  Infusions:     LOS: 11 days     Vernell Leep, MD, [place], [facility]. [facility]    To contact the attending provider between 7A-7P or the covering provider during after hours 7P-7A, please log into the web site [email or web address] and access using universal [place] password for that web site. If you do not have the password, please call the hospital operator.  06/11/2020, 2:33 PM

## 2020-06-11 NOTE — Progress Notes (Signed)
Physical Therapy Treatment Patient Details Name: [name] MRN: [id number] DOB: 1956/08/12 Today's Date: 06/11/2020    History of Present Illness 64 y.o. female with medical history significant of breast CA sp Mastectomy and radiation on the left, morbid obesity, HTN, multiple skin ulcers, Alcohol liver diseaseCKD stage IIIa, Grade 1 diastolic CHF.  Presented with bilateral leg swelling and burning. Most recent radiation session was 3 days PTA. Dx of hyponatremia    PT Comments    Patient making steady progress with Acute PT. She advanced gait distance today with intermittent assist and cues for safe walker management. Patient required seated rest break after ~32' of gait due to muscle fatigue and SOB. Functional LE strengthening completed and exercise for circulation to reduce LE edema. Patient will continue to benefit from skilled PT interventions to progress mobility as able; recommend HHPT follow up.   Follow Up Recommendations  Home health PT;Supervision for mobility/OOB     Equipment Recommendations  None recommended by PT    Recommendations for Other Services       Precautions / Restrictions Precautions Precautions: Fall Precaution Comments: fell PTA, denies other falls in past 1 year Restrictions Weight Bearing Restrictions: No    Mobility  Bed Mobility               General bed mobility comments: OOB in recliner and ended in recliner.   Transfers Overall transfer level: Needs assistance Equipment used: Rolling walker (2 wheeled) Transfers: Sit to/from Stand Sit to Stand: Min guard         General transfer comment: guarding for safety, cues for technique and pt using single UE for power up. cues for safe reach back to control eccentric lowering.   Ambulation/Gait Ambulation/Gait assistance: Min assist Gait Distance (Feet): 64 Feet (2x 32) Assistive device: Rolling walker (2 wheeled) Gait Pattern/deviations: Step-through pattern;Decreased stride  length;Wide base of support Gait velocity: decr   General Gait Details: Assist to steady intermittently and cues to maintain safe proximity to RW. pt fatigues quickly; HR elevated to 118bpm and decreased to 100 with seated rest.    Stairs             Wheelchair Mobility    Modified Rankin (Stroke Patients Only)       Balance Overall balance assessment: Needs assistance Sitting-balance support: Feet supported Sitting balance-Leahy Scale: Good     Standing balance support: Bilateral upper extremity supported Standing balance-Leahy Scale: Poor Standing balance comment: relies on BUE support                            Cognition Arousal/Alertness: Awake/alert Behavior During Therapy: WFL for tasks assessed/performed Overall Cognitive Status: Within Functional Limits for tasks assessed                                        Exercises General Exercises - Lower Extremity Ankle Circles/Pumps: AROM;Both;20 reps;Seated Other Exercises Other Exercises: 2x5 reps Sit<>Stand with sinlge UE use for power up and to control decent.    General Comments        Pertinent Vitals/Pain Pain Assessment: No/denies pain    Home Living                      Prior Function            PT Goals (current goals can  now be found in the care plan section) Acute Rehab PT Goals Patient Stated Goal: likes to sew and watch tv PT Goal Formulation: With patient Time For Goal Achievement: 06/18/20 Potential to Achieve Goals: Good Progress towards PT goals: Progressing toward goals    Frequency    Min 3X/week      PT Plan Current plan remains appropriate    Co-evaluation              AM-PAC PT "6 Clicks" Mobility   Outcome Measure  Help needed turning from your back to your side while in a flat bed without using bedrails?: A Little Help needed moving from lying on your back to sitting on the side of a flat bed without using bedrails?: A  Little Help needed moving to and from a bed to a chair (including a wheelchair)?: A Little Help needed standing up from a chair using your arms (e.g., wheelchair or bedside chair)?: A Little Help needed to walk in hospital room?: A Little Help needed climbing 3-5 steps with a railing? : A Lot 6 Click Score: 17    End of Session Equipment Utilized During Treatment: Gait belt Activity Tolerance: Patient tolerated treatment well Patient left: in chair;with call bell/phone within reach;with chair alarm set Nurse Communication: Mobility status PT Visit Diagnosis: Difficulty in walking, not elsewhere classified (R26.2);History of falling (Z91.81)     Time: 1117-3567 PT Time Calculation (min) (ACUTE ONLY): 18 min  Charges:  $Therapeutic Exercise: 8-22 mins                     Verner Mould, DPT Acute Rehabilitation Services  Office [phone] Pager [phone]  06/11/2020 12:02 PM

## 2020-06-11 NOTE — Patient Outreach (Signed)
Care Coordination  06/11/2020  Daelynn Blower 10-27-1955 [id number]  Leialoha Hanna remains hospitalized.  I have reviewed the patient's progress.  The HR Managed Medicaid team will continue to follow at discharge.   Home Health needs will be followed up upon discharge and the MM CM team will ensure the patient is engaged as appropriate.  Aida Raider RN, BSN [place]  [facility] - Managed Medicaid High Risk [phone]

## 2020-06-12 ENCOUNTER — Ambulatory Visit: Payer: Medicaid Other | Admitting: Family Medicine

## 2020-06-12 LAB — COMPREHENSIVE METABOLIC PANEL
ALT: 96 U/L — ABNORMAL HIGH (ref 0–44)
AST: 116 U/L — ABNORMAL HIGH (ref 15–41)
Albumin: 3.1 g/dL — ABNORMAL LOW (ref 3.5–5.0)
Alkaline Phosphatase: 52 U/L (ref 38–126)
Anion gap: 10 (ref 5–15)
BUN: 83 mg/dL — ABNORMAL HIGH (ref 8–23)
CO2: 34 mmol/L — ABNORMAL HIGH (ref 22–32)
Calcium: 9.4 mg/dL (ref 8.9–10.3)
Chloride: 96 mmol/L — ABNORMAL LOW (ref 98–111)
Creatinine, Ser: 2.11 mg/dL — ABNORMAL HIGH (ref 0.44–1.00)
GFR, Estimated: 24 mL/min — ABNORMAL LOW (ref >60)
Glucose, Bld: 139 mg/dL — ABNORMAL HIGH (ref 70–99)
Potassium: 4.3 mmol/L (ref 3.5–5.1)
Sodium: 140 mmol/L (ref 135–145)
Total Bilirubin: 0.6 mg/dL (ref 0.3–1.2)
Total Protein: 6.9 g/dL (ref 6.5–8.1)

## 2020-06-12 MED ORDER — NYSTATIN 100000 UNIT/GM EX POWD: Freq: Three times a day (TID) | CUTANEOUS | 0 refills | Status: DC

## 2020-06-12 MED ORDER — GABAPENTIN 100 MG PO CAPS
100.0000 mg | ORAL_CAPSULE | Freq: Two times a day (BID) | ORAL | 0 refills | Status: DC
Start: 2020-06-12 — End: 2020-07-14

## 2020-06-12 MED ORDER — THIAMINE HCL 100 MG PO TABS
100.0000 mg | ORAL_TABLET | Freq: Every day | ORAL | 0 refills | Status: DC
Start: 2020-06-13 — End: 2021-01-01

## 2020-06-12 MED ORDER — ENSURE ENLIVE PO LIQD: 237.0000 mL | Freq: Two times a day (BID) | ORAL | 12 refills | Status: DC

## 2020-06-12 MED ORDER — KATE FARMS STANDARD 1.4 PO LIQD: 325.0000 mL | Freq: Two times a day (BID) | ORAL | 12 refills | Status: DC

## 2020-06-12 MED ORDER — HEPARIN SOD (PORK) LOCK FLUSH 100 UNIT/ML IV SOLN
500.0000 [IU] | Freq: Once | INTRAVENOUS | Status: AC
  Administered 2020-06-12: 500 [IU]
  Filled 2020-06-12: qty 5

## 2020-06-12 MED ORDER — ADULT MULTIVITAMIN W/MINERALS CH: 1.0000 | ORAL_TABLET | Freq: Every day | ORAL | Status: DC

## 2020-06-12 MED ORDER — FUROSEMIDE 20 MG PO TABS
20.0000 mg | ORAL_TABLET | Freq: Every day | ORAL | 0 refills | Status: DC
Start: 2020-06-12 — End: 2020-06-26

## 2020-06-12 NOTE — Progress Notes (Signed)
[facility] ROUNDING NOTE   Subjective:   Brief history: 64 year old lady with a history of breast CA status post mastectomy and radiation on the left morbid obesity hypertension multiple skin ulcers and alcoholic liver disease.  She has grade 1 diastolic dysfunction she presented with lower extremity edema.  She was admitted also with acute on chronic kidney disease.  Baseline creatinine is 1.2 to 1.7 mg/dL.  With diuresis it appears that her creatinine has improved.  Blood pressure 135/66 pulse 95 temperature 98.3 O2 sats 99% room air  Urine output 850 cc 06/11/2020 weight decreased to 130.4 kg   Sodium 140 potassium 4.3 chloride 96 CO2 34 BUN 83 creatinine 2.11 glucose 139 albumin 3.1 hemoglobin 7.6  Arimidex 1 mg daily dexamethasone 4 mg daily, folic acid 1 mg daily,   gabapentin 100 mg twice daily      Objective:  Vital signs in last 24 hours:  Temp:  [98.1 F (36.7 C)-98.3 F (36.8 C)] 98.3 F (36.8 C) (10/14 0616) Pulse Rate:  [72-95] 95 (10/14 0616) Resp:  [16-18] 18 (10/14 0616) BP: (135-145)/(66-73) 135/66 (10/14 0616) SpO2:  [98 %-99 %] 99 % (10/14 0616)  Weight change:  Filed Weights   06/09/20 0500 06/10/20 0603 06/11/20 0451  Weight: 132.1 kg 130.6 kg 130.4 kg    Intake/Output: I/O last 3 completed shifts: In: -  Out: 850 [Urine:850]   Intake/Output this shift:  Total I/O In: -  Out: 200 [Urine:200]  alert, nad , morbidly obese  no jvd  Chest rales L base, poor resp effort  Cor reg no RG  Abd soft ntnd no ascites,  pannus     Ext no edema noted  On exam   Alert, NF, ox3, deconditioned   Basic Metabolic Panel: Recent Labs  Lab 06/08/20 0547 06/08/20 0547 06/09/20 0558 06/09/20 0558 06/10/20 0513 06/11/20 0554 06/12/20 0459  NA 129*  --  132*  --  138 136 140  K 3.7  --  3.6  --  3.6 4.2 4.3  CL 89*  --  90*  --  90* 93* 96*  CO2 27  --  30  --  33* 34* 34*  GLUCOSE 121*  --  114*  --  132* 135* 139*  BUN 81*  --  83*   --  87* 87* 83*  CREATININE 2.58*  --  2.30*  --  2.31* 2.07* 2.11*  CALCIUM 9.4   < > 9.4   < > 9.3 9.2 9.4  MG  --   --   --   --  1.4* 2.2  --    < > = values in this interval not displayed.    Liver Function Tests: Recent Labs  Lab 06/09/20 0558 06/10/20 0513 06/11/20 0554 06/12/20 0459  AST 74* 78* 97* 116*  ALT 57* 64* 80* 96*  ALKPHOS 54 53 52 52  BILITOT 0.8 0.7 0.8 0.6  PROT 6.6 6.5 6.5 6.9  ALBUMIN 3.0* 3.0* 3.0* 3.1*   No results for input(s): LIPASE, AMYLASE in the last 168 hours. No results for input(s): AMMONIA in the last 168 hours.  CBC: Recent Labs  Lab 06/08/20 0547 06/09/20 0558 06/10/20 0513  WBC 6.0 4.7 5.0  HGB 7.1* 7.4* 7.6*  HCT 20.8* 21.3* 21.5*  MCV 96.3 95.5 95.6  PLT 179 192 196    Cardiac Enzymes: No results for input(s): CKTOTAL, CKMB, CKMBINDEX, TROPONINI in the last 168 hours.  BNP: Invalid input(s): POCBNP  CBG: No results  for input(s): GLUCAP in the last 168 hours.  Microbiology: Results for orders placed or performed during the hospital encounter of 05/31/20  Respiratory Panel by RT PCR (Flu A&B, Covid) - Nasopharyngeal Swab     Status: None   Collection Time: 05/31/20  7:23 PM   Specimen: Nasopharyngeal Swab  Result Value Ref Range Status   SARS Coronavirus 2 by RT PCR NEGATIVE NEGATIVE Final    Comment: (NOTE) SARS-CoV-2 target nucleic acids are NOT DETECTED.  The SARS-CoV-2 RNA is generally detectable in upper respiratoy specimens during the acute phase of infection. The lowest concentration of SARS-CoV-2 viral copies this assay can detect is 131 copies/mL. A negative result does not preclude SARS-Cov-2 infection and should not be used as the sole basis for treatment or other patient management decisions. A negative result may occur with  improper specimen collection/handling, submission of specimen other than nasopharyngeal swab, presence of viral mutation(s) within the areas targeted by this assay, and inadequate  number of viral copies (<131 copies/mL). A negative result must be combined with clinical observations, patient history, and epidemiological information. The expected result is Negative.  Fact Sheet for Patients:  PinkCheek.be  Fact Sheet for Healthcare Providers:  GravelBags.it  This test is no t yet approved or cleared by the [place] FDA and  has been authorized for detection and/or diagnosis of SARS-CoV-2 by FDA under an Emergency Use Authorization (EUA). This EUA will remain  in effect (meaning this test can be used) for the duration of the COVID-19 declaration under Section 564(b)(1) of the Act, 21 U.S.C. section 360bbb-3(b)(1), unless the authorization is terminated or revoked sooner.     Influenza A by PCR NEGATIVE NEGATIVE Final   Influenza B by PCR NEGATIVE NEGATIVE Final    Comment: (NOTE) The Xpert Xpress SARS-CoV-2/FLU/RSV assay is intended as an aid in  the diagnosis of influenza from Nasopharyngeal swab specimens and  should not be used as a sole basis for treatment. Nasal washings and  aspirates are unacceptable for Xpert Xpress SARS-CoV-2/FLU/RSV  testing.  Fact Sheet for Patients: PinkCheek.be  Fact Sheet for Healthcare Providers: GravelBags.it  This test is not yet approved or cleared by the [place] FDA and  has been authorized for detection and/or diagnosis of SARS-CoV-2 by  FDA under an Emergency Use Authorization (EUA). This EUA will remain  in effect (meaning this test can be used) for the duration of the  Covid-19 declaration under Section 564(b)(1) of the Act, 21  U.S.C. section 360bbb-3(b)(1), unless the authorization is  terminated or revoked. Performed at [facility], [place] [address] [place], [place] [address]     Coagulation Studies: No results for input(s): LABPROT, INR in the last 72 hours.   Urinalysis: No results for input(s): COLORURINE, LABSPEC, PHURINE, GLUCOSEU, HGBUR, BILIRUBINUR, KETONESUR, PROTEINUR, UROBILINOGEN, NITRITE, LEUKOCYTESUR in the last 72 hours.  Invalid input(s): APPERANCEUR    Imaging: No results found.   Medications:    . anastrozole  1 mg Oral Daily  . Chlorhexidine Gluconate Cloth  6 each Topical Daily  . dexamethasone  4 mg Oral Daily  . docusate sodium  100 mg Oral BID  . feeding supplement  237 mL Oral BID BM  . feeding supplement (KATE FARMS STANDARD 1.4)  325 mL Oral BID BM  . folic acid  1 mg Oral Daily  . gabapentin  100 mg Oral BID  . heparin  5,000 Units Subcutaneous Q8H  . multivitamin with minerals  1 tablet Oral Daily  .  nystatin   Topical TID  . sodium chloride flush  3 mL Intravenous Q12H  . thiamine  100 mg Oral Daily   Or  . thiamine  100 mg Intravenous Daily   acetaminophen **OR** acetaminophen, HYDROcodone-acetaminophen, ondansetron **OR** ondansetron (ZOFRAN) IV, polyethylene glycol, sodium chloride flush  Assessment/ Plan:  1. AoCKD 3b - b/l creat 1.2- 1.7, eGFR 35- 55 ml/min. Suspect AKI due to nsaids + cardiorenal syndrome w/ EF 06/04/2020 65%  BP's high on presentation w/ sig vol overload/ anasarca and low UNa. No obstruction on [place]. CXR clear from 10/02. UPC ratio 521m and UA negative.   This may need new baseline creatinine 2.0 mg/dL.  2. Hyponatremia - hypervolemic w/ edematous state. Na+ up 129- 132 range now.  Appears resolved 3. Hx breast cancer - failed regular chemo d/t side effects, now on hormonal Rx. Finished radiation a few weeks ago, also had L mastectomy.  4. Anemia/ pancytopenia - not sure cause, hx prior chemoRx.  SPEP negative for M-spike.  FLC ratio up slightly, doubt clinically significant.  5. HTN - bp's down yest, coreg dc'd, BP's  better  6. Morbid obesity - BMI 60.  Obesity is driving all of this including diast HF/ vol overload and renal failure.  7. Hypertension/volume would resume 20 mg of  Lasix daily.  This was her outpatient dose I would recommend a daily dose of Lasix at this point  She is stable for discharge from a renal standpoint [facility]   LOS: [id number]_0 _1 :15 AM

## 2020-06-12 NOTE — Progress Notes (Signed)
Patient discharged to home with care giver, discharge instructions reviewed with Patient who verbalized understanding. New RX sent to pharmacy electronically.

## 2020-06-12 NOTE — Discharge Summary (Addendum)
Physician Discharge Summary  [name] JQB:[id number] DOB: 04-Oct-1955  PCP: [name], MD  Admitted from: Home Discharged to: Home  Admit date: 05/31/2020 Discharge date: 06/12/2020  Recommendations for Outpatient Follow-up:    Follow-up Information    Care, [facility] Follow up.   Specialty: Home Health Services Why: This is the home health agency that will be providing you with physcial therapy and a nurse consultant.  They will be able to start with you on 10/13, Wednesday. Contact information: [address] [place] [address] [place]. Go on 06/27/2020.   Why: Follow up appointment at 11:10am.  To be seen with repeat labs (CBC & CMP). Contact information: [place] [address] [phone]       Edrick Oh, MD Follow up on 07/04/2020.   Specialty: Nephrology Why: 10 AM. Contact information: [place] [address] [phone]              Has upcoming follow-up appointments with her oncologist, new consultation appointment with cardiology.  Home Health:  Home Health Orders (From admission, onward)    Start     Ordered   06/06/20 [place]  At discharge       Question Answer Comment  To provide the following care/treatments PT   To provide the following care/treatments RN      06/06/20 1545   06/06/20 [place]  At discharge       Question Answer Comment  To provide the following care/treatments RN   To provide the following care/treatments PT      06/06/20 1322         Equipment/Devices: None  Discharge Condition: Improved and stable CODE STATUS: Full Diet recommendation: Heart healthy diet.  Discharge Diagnoses:  Active Problems:   Essential hypertension   Alcohol dependence ([place])   Edema   Malignant neoplasm of upper-outer quadrant of left breast in female, estrogen receptor positive ([place])    Morbid obesity with BMI of 60.0-69.9, adult (HCC)   Acute renal failure (ARF) (HCC)   Thrombocytopenia (HCC)   Debility   Anemia of chronic disease   Chronic kidney disease, stage 3a (HCC)   Hyponatremia   Pancytopenia (HCC)   Breast cancer (HCC)   Brief Summary: 64 year old female with PMH of left breast cancer s/p mastectomy and radiation, morbid obesity, hypertension, multiple skin ulcers, alcoholic liver disease, CKD stage IIIa, chronic diastolic CHF presented with bilateral leg swelling and burning on the bottom of her feet.  She had hyponatremia and leg edema.  Hospital course complicated by acute on chronic kidney disease.    Nephrology consulted.   Assessment & Plan:   Hypervolemic hyponatremia Presented with serum sodium 119, low serum osmolality, hypervolemic on exam Treated with lasix gtt and metolazone, both of them since discontinued. Pt now auto-diuresing well Resolved.   Acute kidney injury complicating stage IIIb CKD. Creatinine normal, 1.05-1.07 in early September 2021. Presented with creatinine of 4.49 Nephrology consulted and following. AKI felt to be due to ATN, NSAIDs, cardiorenal syndrome with acute on chronic diastolic CHF.  Patient had significantly high blood pressures on presentation with significant volume overload/anasarca and low urine sodium. No obstruction on renal ultrasound. Diuretics discontinued.  Patient now auto diuresing and creatinine steadily improving. Continue to avoid nephrotoxic's and hypotension.  Patient was specifically counseled to avoid ibuprofen or other NSAIDs. Foley catheter discontinued  10/13.  Patient voiding without difficulty and repeat bladder scan was negative. Creatinine has plateaued over the last 2 days and 2.11 today.  I discussed with Dr. Justin Mend, who indicated that this may be a new baseline for her.  He recommends discharging patient on Lasix 20 mg daily and has arranged outpatient follow-up with repeat labs.  Anemia  of chronic disease secondary to CKD No overt bleeding Hgb stable in the 7 g range. Prior hospitalist discussed with Oncology. Pt may benefit from GI follow upafterd/c-will defer this to patient's PCP to coordinate or arrange. Iron level normal at 130  Leukopenia/thrombocytopenia likely secondary to acute illness Both resolved.  Hypokalemia/hypomagnesemia Resolved.  Abnormal LFTs/mild transaminitis/isolated hyperbilirubinemia May be due to passive hepatic congestion from acute CHF.  Bilirubin has normalized.  Mild transaminitis, minimally creeping up over the last 4 days but not markedly so.  No GI symptoms.  Etiology not entirely clear.  Close outpatient follow-up with repeat labs and consider further work-up if persists or worsening.  Had similar issue in mid September.  Acute on Chronic diastolic CHF Last 2D echo done on 02/11/2020 showed LVEF 70 to 75% and grade 1 diastolic dysfunction Repeat 2D echo reviewed. Findings of EF 62-83%TDVV grade 2 diastolic dysfunction Continue strict I/O's Prior hospitalistdiscussed case with Cardiology who agreed with diuresis while in hospital.Cardiology recommends establishing follow up with Cardiology when pt is ultimately discharged.    Patient has new appointment on 10/28. Clinically now euvolemic and also auto diuresing.  As discussed with nephrology, discharging home on low-dose Lasix 20 mg daily.  Physical debility PT recs for supervision for mobility/oob, HHPT Fall precautions. Patient indicates that her adult granddaughter lives with her and provides 24/7 supervision or assistance.  History of breast cancer/radiation Last radiation 3 days prior to presentation affecting left breast, Nodes noted on exam, POA Wound care specialist for local wound care recommendations: These will be educated to patient and family prior to discharge. Continue local wound care as toleraetd.  Patient's intertriginous dermatitis in the subpannicular skin  fold seems to be improving but appears to be a recurrent issue based on the WOCN RN note from 06/01/2020. Continue Arimidex and dexamethasone-unclear how long she is supposed to be on this and if this can be tapered.  She has follow-up appointment with her oncologist next month.  Body mass index is 54.32 kg/m./Severe morbid obesity: Recommended diet/lifestyle modification.  Not sure she will be a candidate for bariatric surgery.  Outpatient follow-up.  Nutritional Status Nutrition Problem: Increased nutrient needs Etiology: cancer and cancer related treatments Signs/Symptoms: estimated needs Interventions: Ensure Enlive (each supplement provides 350kcal and 20 grams of protein), MVI [place])    Consultants:   Nephrology  Procedures:   Foley catheter discontinued 10/13    Discharge Instructions  Discharge Instructions    (HEART FAILURE PATIENTS) Call MD:  Anytime you have any of the following symptoms: 1) 3 pound weight gain in 24 hours or 5 pounds in 1 week 2) shortness of breath, with or without a dry hacking cough 3) swelling in the hands, feet or stomach 4) if you have to sleep on extra pillows at night in order to breathe.   Complete by: As directed    Call MD for:  difficulty breathing, headache or visual disturbances   Complete by: As directed    Call MD for:  extreme fatigue   Complete by: As directed    Call MD for:  persistant dizziness or light-headedness   Complete by:  As directed    Call MD for:  persistant nausea and vomiting   Complete by: As directed    Call MD for:  redness, tenderness, or signs of infection (pain, swelling, redness, odor or green/yellow discharge around incision site)   Complete by: As directed    Call MD for:  severe uncontrolled pain   Complete by: As directed    Call MD for:  temperature >100.4   Complete by: As directed    Diet - low sodium heart healthy   Complete by: As directed    Discharge wound care:   Complete by: As  directed    Wound care  Daily  Comments: Wound care to areas of intertriginous dermatitis in the subpannicular area:  Cleanse with soap and water, rinse and pat thoroughly dry. Apply house silver-impregnated (antimicrobial) wicking textile Estrella Deeds # [phone]) according to the instructions below: Measure and cut length of InterDry to fit in skin folds that have skin breakdown  Tuck InterDry fabric into skin folds in a single layer, allow for 2 inches of overhang from skin edges to allow for wicking to occur May remove to bathe; dry area thoroughly and then tuck into affected areas again  Do not apply any creams or ointments when using InterDry DO NOT THROW AWAY FOR 5 DAYS unless soiled with stool DO NOT [facility] product, this will inactivate the silver in the material  New sheet of Interdry should be applied after 5 days of use if patient continues to have skin breakdown   Increase activity slowly   Complete by: As directed        Medication List    STOP taking these medications   carvedilol 12.5 MG tablet Commonly known as: COREG   hydrALAZINE 25 MG tablet Commonly known as: APRESOLINE   ibuprofen 600 MG tablet Commonly known as: ADVIL     TAKE these medications   anastrozole 1 MG tablet Commonly known as: ARIMIDEX Take 1 tablet (1 mg total) by mouth daily.   aspirin 81 MG EC tablet Take 1 tablet (81 mg total) by mouth daily. Swallow whole.   Blood Pressure Monitor Devi Use as instructed by provider to check bloos pressure daily. ICD: I10   dexamethasone 4 MG tablet Commonly known as: DECADRON Take 4 mg by mouth daily.   feeding supplement Liqd Take 237 mLs by mouth 2 (two) times daily between meals.   feeding supplement (KATE FARMS STANDARD 1.4) Liqd liquid Take 325 mLs by mouth 2 (two) times daily between meals.   folic acid 1 MG tablet Commonly known as: FOLVITE Take 1 tablet (1 mg total) by mouth daily.   furosemide 20 MG tablet Commonly known as:  LASIX Take 1 tablet (20 mg total) by mouth daily.   gabapentin 100 MG capsule Commonly known as: NEURONTIN Take 1 capsule (100 mg total) by mouth 2 (two) times daily.   lidocaine-prilocaine cream Commonly known as: EMLA Apply 1 application topically as needed. What changed: reasons to take this   multivitamin with minerals Tabs tablet Take 1 tablet by mouth daily. Start taking on: June 13, 2020   nystatin powder Commonly known as: MYCOSTATIN/NYSTOP Apply topically 3 (three) times daily. Apply to under panus.   thiamine 100 MG tablet Take 1 tablet (100 mg total) by mouth daily. Start taking on: June 13, 2020      Allergies  Allergen Reactions  . Amlodipine Swelling    BLE edema      Procedures/Studies: US RENAL  Result Date: 05/31/2020 CLINICAL DATA:  Acute kidney injury. EXAM: RENAL / URINARY TRACT ULTRASOUND COMPLETE COMPARISON:  CT abdomen pelvis 03/07/2020 FINDINGS: Right Kidney: Renal measurements: 13.3 x 5.3 x 6.2 cm = volume: 226 mL. Echogenicity within normal limits. No mass or hydronephrosis visualized. Left Kidney: Renal measurements: 11.3 x 5.8 x 4.9 cm = volume: 185 mL. Poor visualization secondary to patient body habitus. No definite hydronephrosis, renal calculus or renal mass. Bladder: Not visualized. Other: None. IMPRESSION: Limited evaluation particularly of the left kidney due to patient body habitus. No definite acute finding. Electronically Signed   By: Audie Pinto M.D.   On: 05/31/2020 19:52   DG Chest Port 1 View  Result Date: 05/31/2020 CLINICAL DATA:  Left arm and hand swelling for several days. EXAM: PORTABLE CHEST 1 VIEW COMPARISON:  04/29/2020 FINDINGS: There is a stable right-sided subclavian Port-A-Cath. The heart size remains enlarged but stable from prior study. Aortic calcifications are noted. There is probable mild vascular congestion without overt pulmonary edema. There is no pneumothorax. Surgical clips are noted in the patient's  left axilla which may related to prior lymph node dissection. IMPRESSION: Cardiomegaly with mild pulmonary vascular congestion. No overt pulmonary edema. Electronically Signed   By: Constance Holster M.D.   On: 05/31/2020 19:27   ECHOCARDIOGRAM COMPLETE  Result Date: 06/04/2020    ECHOCARDIOGRAM REPORT   Patient Name:   [name] Date of Exam: 06/04/2020 Medical Rec #:  [id number]     Height:       61.0 in Accession #:    [id number]    Weight:       324.1 lb Date of Birth:  [birth year]    BSA:          2.319 m Patient Age:    64 years      BP:           114/77 mmHg Patient Gender: F             HR:           71 bpm. Exam Location:  Inpatient Procedure: 2D Echo, Cardiac Doppler and Color Doppler Indications:    Anasarca [id number]  History:        Patient has prior history of Echocardiogram examinations, most                 recent 02/11/2020. Risk Factors:Hypertension. Breast cancer.                 Edema.  Sonographer:    Roseanna Rainbow RDCS Referring Phys: [id number]  Sonographer Comments: Technically difficult study due to poor echo windows and patient is morbidly obese. Image acquisition challenging due to patient body habitus and Image acquisition challenging due to mastectomy. IMPRESSIONS  1. Left ventricular ejection fraction, by estimation, is 60 to 65%. The left ventricle has normal function. The left ventricle has no regional wall motion abnormalities. Left ventricular diastolic parameters are consistent with Grade II diastolic dysfunction (pseudonormalization). Elevated left ventricular end-diastolic pressure.  2. Right ventricular systolic function is normal. The right ventricular size is normal. There is moderately elevated pulmonary artery systolic pressure.  3. Left atrial size was mildly dilated.  4. The mitral valve is normal in structure. Trivial mitral valve regurgitation. No evidence of mitral stenosis.  5. The aortic valve was not well visualized. Aortic valve regurgitation is not  visualized. Mild aortic valve stenosis.  6. Aortic dilatation noted. There is mild dilatation of the ascending aorta, measuring 38  mm.  7. The inferior vena cava is normal in size with greater than 50% respiratory variability, suggesting right atrial pressure of 3 mmHg. FINDINGS  Left Ventricle: Left ventricular ejection fraction, by estimation, is 60 to 65%. The left ventricle has normal function. The left ventricle has no regional wall motion abnormalities. The left ventricular internal cavity size was normal in size. There is  no left ventricular hypertrophy. Left ventricular diastolic parameters are consistent with Grade II diastolic dysfunction (pseudonormalization). Elevated left ventricular end-diastolic pressure. Right Ventricle: The right ventricular size is normal. No increase in right ventricular wall thickness. Right ventricular systolic function is normal. There is moderately elevated pulmonary artery systolic pressure. The tricuspid regurgitant velocity is 2.92 m/s, and with an assumed right atrial pressure of 15 mmHg, the estimated right ventricular systolic pressure is 76.1 mmHg. Left Atrium: Left atrial size was mildly dilated. Right Atrium: Right atrial size was normal in size. Pericardium: There is no evidence of pericardial effusion. Mitral Valve: The mitral valve is normal in structure. Trivial mitral valve regurgitation. No evidence of mitral valve stenosis. MV peak gradient, 14.5 mmHg. The mean mitral valve gradient is 4.5 mmHg. Tricuspid Valve: The tricuspid valve is normal in structure. Tricuspid valve regurgitation is mild . No evidence of tricuspid stenosis. Aortic Valve: The aortic valve was not well visualized. Aortic valve regurgitation is not visualized. Mild aortic stenosis is present. Aortic valve mean gradient measures 10.5 mmHg. Aortic valve peak gradient measures 22.3 mmHg. Aortic valve area, by VTI  measures 1.31 cm. Pulmonic Valve: The pulmonic valve was normal in structure.  Pulmonic valve regurgitation is not visualized. No evidence of pulmonic stenosis. Aorta: The aortic root is normal in size and structure and aortic dilatation noted. There is mild dilatation of the ascending aorta, measuring 38 mm. Venous: The inferior vena cava is normal in size with greater than 50% respiratory variability, suggesting right atrial pressure of 3 mmHg. IAS/Shunts: No atrial level shunt detected by color flow Doppler.  LEFT VENTRICLE PLAX 2D LVIDd:         3.90 cm     Diastology LVIDs:         2.10 cm     LV e' medial:    6.42 cm/s LV PW:         1.00 cm     LV E/e' medial:  24.1 LV IVS:        1.00 cm     LV e' lateral:   7.07 cm/s LVOT diam:     1.70 cm     LV E/e' lateral: 21.9 LV SV:         58 LV SV Index:   25 LVOT Area:     2.27 cm  LV Volumes (MOD) LV vol d, MOD A2C: 69.3 ml LV vol d, MOD A4C: 83.7 ml LV vol s, MOD A2C: 19.8 ml LV vol s, MOD A4C: 22.6 ml LV SV MOD A2C:     49.5 ml LV SV MOD A4C:     83.7 ml LV SV MOD BP:      56.9 ml LEFT ATRIUM           Index       RIGHT ATRIUM           Index LA diam:      3.90 cm 1.68 cm/m  RA Area:     17.40 cm LA Vol (A2C): 32.6 ml 14.06 ml/m RA Volume:   39.60 ml  17.08 ml/m LA  Vol (A4C): 21.1 ml 9.10 ml/m  AORTIC VALVE AV Area (Vmax):    1.53 cm AV Area (Vmean):   1.48 cm AV Area (VTI):     1.31 cm AV Vmax:           236.00 cm/s AV Vmean:          152.500 cm/s AV VTI:            0.446 m AV Peak Grad:      22.3 mmHg AV Mean Grad:      10.5 mmHg LVOT Vmax:         159.00 cm/s LVOT Vmean:        99.300 cm/s LVOT VTI:          0.257 m LVOT/AV VTI ratio: 0.58  AORTA Ao Root diam: 2.40 cm Ao Asc diam:  3.80 cm MITRAL VALVE                TRICUSPID VALVE MV Area (PHT): 2.76 cm     TR Peak grad:   34.1 mmHg MV Peak grad:  14.5 mmHg    TR Vmax:        292.00 cm/s MV Mean grad:  4.5 mmHg MV Vmax:       1.91 m/s     SHUNTS MV Vmean:      90.4 cm/s    Systemic VTI:  0.26 m MV Decel Time: 275 msec     Systemic Diam: 1.70 cm MV E velocity: 155.00 cm/s MV  A velocity: 89.60 cm/s MV E/A ratio:  1.73 Jenkins Rouge MD Electronically signed by Jenkins Rouge MD Signature Date/Time: 06/04/2020/12:52:51 PM    Final    UE VENOUS DUPLEX ([facility] 7 am - 7 pm)  Result Date: 06/01/2020 UPPER VENOUS STUDY  Indications: Edema Limitations: Bandages, line, poor ultrasound/tissue interface, body habitus and edema, patient's somnolence. Comparison Study: No prior study Performing Technologist: Sharion Dove RVS  Examination Guidelines: A complete evaluation includes B-mode imaging, spectral Doppler, color Doppler, and power Doppler as needed of all accessible portions of each vessel. Bilateral testing is considered an integral part of a complete examination. Limited examinations for reoccurring indications may be performed as noted.  Left Findings: +----------+------------+---------+-----------+----------+-------+ LEFT      CompressiblePhasicitySpontaneousPropertiesSummary +----------+------------+---------+-----------+----------+-------+ IJV                      Yes       Yes                      +----------+------------+---------+-----------+----------+-------+ Subclavian               Yes       Yes                      +----------+------------+---------+-----------+----------+-------+ Axillary                 Yes       Yes                      +----------+------------+---------+-----------+----------+-------+ Brachial      Full       Yes       Yes                      +----------+------------+---------+-----------+----------+-------+ Cephalic      Full                                          +----------+------------+---------+-----------+----------+-------+  Basilic                  Yes       Yes                      +----------+------------+---------+-----------+----------+-------+  Summary:  Left: No obvious evidence of deep vein thrombosis in the upper extremity. No obvious evidence of superficial vein thrombosis in the upper  extremity.  *See table(s) above for measurements and observations.  Diagnosing physician: Harold Barban MD Electronically signed by Harold Barban MD on 06/01/2020 at 7:43:29 PM.    Final       Subjective: Patient sitting up comfortably in chair eating breakfast this morning.  States that she feels fine.  Denies complaints.  Continues to urinate well.  No chest pain, dyspnea, dizziness, lightheadedness or leg swelling.  No pain reported.  Discharge Exam:  Vitals:   06/11/20 0451 06/11/20 1505 06/11/20 2026 06/12/20 0616  BP: (!) 150/73 136/73 (!) 145/67 135/66  Pulse: 70 72 81 95  Resp: 16 16 18 18   Temp: 98.4 F (36.9 C) 98.1 F (36.7 C) 98.2 F (36.8 C) 98.3 F (36.8 C)  TempSrc: Oral Oral Oral Oral  SpO2: 100% 99% 98% 99%  Weight: 130.4 kg     Height:        General exam: Pleasant middle-aged female, moderately built and morbidly obese sitting up comfortably in chair this morning. Respiratory system: Clear to auscultation. Respiratory effort normal.  Right upper anterior chest has Port-A-Cath. Cardiovascular system: S1 & S2 heard, RRR. No JVD, murmurs, rubs, gallops or clicks. No pedal edema.  Gastrointestinal system: Abdomen is nondistended, soft and nontender. No organomegaly or masses felt. Normal bowel sounds heard. Central nervous system: Alert and oriented. No focal neurological deficits. Extremities: Symmetric 5 x 5 power. Skin: No rashes, lesions or ulcers Psychiatry: Judgement and insight appear normal. Mood & affect appropriate.     The results of significant diagnostics from this hospitalization (including imaging, microbiology, ancillary and laboratory) are listed below for reference.     Microbiology: No results found for this or any previous visit (from the past 240 hour(s)).   Labs: CBC: Recent Labs  Lab 06/08/20 0547 06/09/20 0558 06/10/20 0513  WBC 6.0 4.7 5.0  HGB 7.1* 7.4* 7.6*  HCT 20.8* 21.3* 21.5*  MCV 96.3 95.5 95.6  PLT 179 192 196     Basic Metabolic Panel: Recent Labs  Lab 06/08/20 0547 06/09/20 0558 06/10/20 0513 06/11/20 0554 06/12/20 0459  NA 129* 132* 138 136 140  K 3.7 3.6 3.6 4.2 4.3  CL 89* 90* 90* 93* 96*  CO2 27 30 33* 34* 34*  GLUCOSE 121* 114* 132* 135* 139*  BUN 81* 83* 87* 87* 83*  CREATININE 2.58* 2.30* 2.31* 2.07* 2.11*  CALCIUM 9.4 9.4 9.3 9.2 9.4  MG  --   --  1.4* 2.2  --     Liver Function Tests: Recent Labs  Lab 06/09/20 0558 06/10/20 0513 06/11/20 0554 06/12/20 0459  AST 74* 78* 97* 116*  ALT 57* 64* 80* 96*  ALKPHOS 54 53 52 52  BILITOT 0.8 0.7 0.8 0.6  PROT 6.6 6.5 6.5 6.9  ALBUMIN 3.0* 3.0* 3.0* 3.1*     Urinalysis    Component Value Date/Time   COLORURINE YELLOW 05/31/2020 1923   APPEARANCEUR CLEAR 05/31/2020 1923   LABSPEC 1.012 05/31/2020 1923   PHURINE 5.0 05/31/2020 1923   GLUCOSEU NEGATIVE 05/31/2020 1923   HGBUR NEGATIVE 05/31/2020  [place] 05/31/2020 [place] NEGATIVE 05/31/2020 1923   PROTEINUR 100 (A) 05/31/2020 1923   UROBILINOGEN 1.0 07/14/2012 1630   NITRITE NEGATIVE 05/31/2020 1923   LEUKOCYTESUR NEGATIVE 05/31/2020 1923    I discussed patient's care in detail with her niece via phone, updated care and answered all questions.  Time coordinating discharge: 45 minutes  SIGNED:  Vernell Leep, MD, [place], [facility]. [facility]  To contact the attending provider between 7A-7P or the covering provider during after hours 7P-7A, please log into the web site [email or web address] and access using universal [place] password for that web site. If you do not have the password, please call the hospital operator.

## 2020-06-12 NOTE — Discharge Instructions (Signed)

## 2020-06-12 NOTE — TOC Transition Note (Signed)
Transition of Care [facility]) - CM/SW Discharge Note   Patient Details  Name: [name] MRN: [id number] Date of Birth: 04-28-1956  Transition of Care [facility]) CM/SW Contact:  Lynnell Catalan, RN Phone Number: 06/12/2020, 9:42 AM   Clinical Narrative:     Pt to dc home today. Follow up appointment at [facility] changed from today til two weeks from today (earliest appointment available). Placed on AVS. Pt states that her personal caregiver would come and get her from the hospital and take her home. [facility] alerted of dc today.  Final next level of care: [place] [facility] Barriers to Discharge: Continued Medical Work up   Patient Goals and CMS Choice Patient states their goals for this hospitalization and ongoing recovery are:: return home   Discharge Plan and Services In-house Referral: Clinical Social Work Discharge Planning Services: CM Consult Post Acute Care Choice: [place] Arranged: PT, RN     Social Determinants of Health (SDOH) Interventions     Readmission Risk Interventions Readmission Risk Prevention Plan 03/06/2020 02/13/2020  Transportation Screening Complete Complete  PCP or Specialist Appt within 3-5 Days Not Complete -  Not Complete comments Unsure of dc date at this time -  [place] or [place] Complete -  Social Work Consult for [place] Planning/Counseling Complete -  Palliative Care Screening Not Applicable -  Medication Review Press photographer) Complete Complete  PCP or Specialist appointment within 3-5 days of discharge - Complete  [place] or [place] - Not Applicable  Some recent data might be hidden

## 2020-06-13 ENCOUNTER — Telehealth: Payer: Self-pay

## 2020-06-13 NOTE — Telephone Encounter (Signed)
Transition Care Management Follow-up Telephone Call  Date of discharge and from where:[facility] How have you been since you were released from the hospital? Much better  Any questions or concerns? No questions/concerns reported.  Items Reviewed: Did the pt receive and understand the discharge instructions provided? Stated that have the instructions and have no questions.  Medications obtained and verified? She said that they have the medication list  and the hospital staff reviewed them in detail prior to discharge. She said that he has all of the medications and they have no questions.  Any new allergies since your discharge? None reported  Do you have support at home? Yes, spouse Other (ie: DME, Home Health, etc)       Functional Questionnaire: (I = Independent and D = Dependent) ADL's:  Some assistance from the nurse  Has 7days nurse assistance for 2 h.      Follow up appointments reviewed:    PCP Hospital f/u appt confirmed? Dr Chapman Fitch on 06/25/2020. Informed they would receive notification if the appointment is in person or televisit.      [facility] f/u appt confirmed? scheduled at this time   Are transportation arrangements needed?  have transportation    If their condition worsens, is the pt aware to call  their PCP or go to the ED? Yes.Made pt aware if condition worsen or start experiencing rapid weight gain, chest pain, diff breathing, SOB, high fevers, or bleading to refer imediately to ED for further evaluation.   Was the patient provided with contact information for the PCP's office or ED? He has the phone number   Was the pt encouraged to call back with questions or concerns?yes  No DME given has a walker and wheeler

## 2020-06-16 ENCOUNTER — Telehealth: Payer: Self-pay | Admitting: Radiation Oncology

## 2020-06-16 NOTE — Telephone Encounter (Signed)
  Radiation Oncology         [phone] ________________________________  Name: [name] MRN: [id number]  Date of Service: 06/16/2020  DOB: 08/12/1956  Post Treatment Telephone Note  Diagnosis:  Stage IIA, pT2N1aM0, grade 3, ER/PR positive invasive ductal carcinoma of the left breast.  Interval Since Last Radiation:  4 weeks   03/26/20 - 05/21/20: The patient initially received a dose of 50.4 Gy in 28 fractions to the chest wall and supraclavicular region. This was delivered using a 3-D conformal, 4 field technique. The patient then received a boost to the mastectomy scar. This delivered an additional 10 Gy in 5 fractions using an en face electron field. The total dose was 60.4 Gy.  Narrative:  The patient was contacted today for routine follow-up. During treatment she did very well with radiotherapy and did have modest skin desquamation. She was recently in the hospital for CHF and renal failure. She reports she is doing well with her heart healthy diet and working at home now with PT. Her skin breakdown has improved.  Impression/Plan: 1. Stage IIA, pT2N1aM0, grade 3, ER/PR positive invasive ductal carcinoma of the left breast. The patient has been doing well since completion of radiotherapy. We discussed that we would be happy to continue to follow her as needed, but she will also continue to follow up with Dr. Jana Hakim in medical oncology. She was counseled on skin care as well as measures to avoid sun exposure to this area.  2. Survivorship. We discussed the importance of survivorship evaluation and encouraged her to attend her upcoming visit with that clinic. 3. CHF and Renal Failure. The patient is working to keep her heart healthy diet and movement, and I encouraged her to continue her goals of exercises and sticking to her current regimen.    Carola Rhine, PAC

## 2020-06-18 ENCOUNTER — Other Ambulatory Visit: Payer: Self-pay

## 2020-06-18 ENCOUNTER — Other Ambulatory Visit: Payer: Self-pay | Admitting: Obstetrics and Gynecology

## 2020-06-18 NOTE — Patient Instructions (Signed)
Hi Ms. Nachtigal you for speaking with me today.   Ms. Frankowski was given information about Medicaid Managed Care team care coordination services and consented to engagement with the [facility] Managed Care team.   Goals Addressed              This Visit's Progress   .  "I'm having trouble having BM" (pt-stated)        CARE PLAN ENTRY Medicaid Managed Care (see longitudinal plan of care for additional care plan information)  Current Barriers:  Patient experiencing trouble having bowel movement since returning home from hospital.  Nurse Case Manager Clinical Goal(s):  Marland Kitchen Over the next 30 days, patient will verbalize understanding of plan for improved bowel function. . Over the next 30 days, patient will work with providers  to address needs related to improved bowel function. . Over the next 30 days, patient will not experience hospital admission. Hospital Admissions in last 6 months = 6 . Over the next 30 days, patient will attend all scheduled medical appointments:  . Over the next 30 days, patient will experience decrease in ED visits. ED visits in last 6 months = 5. . Over the next 30 days, patient will work with CM team pharmacist to address options for improved bowel function.  Interventions:  . Inter-disciplinary care team collaboration (see longitudinal plan of care) . Advised patient to follow up with providers. Nash Dimmer with pharmacy regarding options for improved bowel function. . Discussed plans with patient for ongoing care management follow up and provided patient with direct contact information for care management team . Pharmacy referral for options to improve bowel function.  Plan:  . Patient will follow up with providers regarding improved bowel function. Marland Kitchen RNCM will follow up with patient within 7 days.   Initial goal documentation     .  "I'm having trouble sleeping" (pt-stated)        [place] Medicaid Managed Care (see longitudinal plan of  care for additional care plan information)  Current Barriers:  Marland Kitchen Knowledge Deficits related to sleep deficit . Care Coordination needs related to sleep deficit  Nurse Case Manager Clinical Goal(s):  Over the next 30 days, patient will verbalize understanding of plan for increased sleep. . Over the next 30 days, patient will work with provider team to address needs related to sleep. . Over the next 30 days, patient will not experience hospital admission. Hospital Admissions in last 6 months = 6. . Over the next 30 days, patient will attend all scheduled medical appointments:  . Over the next 30 days, patient will experience decrease in ED visits. ED visits in last 6 months = 5 . Over the next 30days, patient will work with CM team pharmacist to discuss any recommendations for sleep.  Interventions:  . Inter-disciplinary care team collaboration (see longitudinal plan of care) . Advised patient to work with providers for options related to improved sleep. . Provided education to patient re: improved sleep. Nash Dimmer with pharmacy regarding options for improved sleep. . Discussed plans with patient for ongoing care management follow up and provided patient with direct contact information for care management team . Pharmacy referral for improved sleep options.  Plan:  . Patient will work with provider teams for improvement. Marland Kitchen RNCM will follow up with patient within 7 days   Initial goal documentation        The Managed Medicaid care management team will reach out to the patient again over the next 7  days.   Aida Raider RN, BSN [place] Management Coordinator - Managed [place] High Risk [phone]

## 2020-06-18 NOTE — Patient Outreach (Signed)
Care Coordination - Case Manager  06/18/2020  [name] 07-30-1956 [id number]  Subjective:  [name] is an 64 y.o. year old female who is a primary patient of [name], Sue Gaster, MD.  [name] was given information about Medicaid Managed Care team care coordination services today. [name] agreed to services and verbal consent obtained  Review of patient status, laboratory and other test data was performed as part of evaluation for provision of services.  SDOH: SDOH Screenings   Alcohol Screen:   . Last Alcohol Screening Score (AUDIT): Not on file  Depression (PHQ2-9): Low Risk   . PHQ-2 Score: 4  Financial Resource Strain:   . Difficulty of Paying Living Expenses: Not on file  Food Insecurity:   . Worried About Charity fundraiser in the Last Year: Not on file  . Ran Out of Food in the Last Year: Not on file  Housing:   . Last Housing Risk Score: Not on file  Physical Activity:   . Days of Exercise per Week: Not on file  . Minutes of Exercise per Session: Not on file  Social Connections:   . Frequency of Communication with Friends and Family: Not on file  . Frequency of Social Gatherings with Friends and Family: Not on file  . Attends Religious Services: Not on file  . Active Member of Clubs or Organizations: Not on file  . Attends Archivist Meetings: Not on file  . Marital Status: Not on file  Stress:   . Feeling of Stress : Not on file  Tobacco Use: Low Risk   . Smoking Tobacco Use: Never Smoker  . Smokeless Tobacco Use: Never Used  Transportation Needs: Unmet Transportation Needs  . Lack of Transportation (Medical): Yes  . Lack of Transportation (Non-Medical): Yes     Objective:    Allergies  Allergen Reactions  . Amlodipine Swelling    BLE edema    Medications:    Medications Reviewed Today    Reviewed by Sue Medicus, RN (Registered Nurse) on 06/18/20 at [place] List Status: <None>  Medication Order Taking? Sig  Documenting Provider Last Dose Status Informant  anastrozole (ARIMIDEX) 1 MG tablet [id number] Yes Take 1 tablet (1 mg total) by mouth daily. Magrinat, Sue Dad, MD Taking Active Self  aspirin EC 81 MG EC tablet [id number] Yes Take 1 tablet (81 mg total) by mouth daily. Swallow whole. Sue Sells, MD Taking Active Self  Blood Pressure Monitor DEVI [id number] Yes Use as instructed by provider to check bloos pressure daily. ICD: I10 [name], Sue Gaster, MD Taking Active Self  dexamethasone (DECADRON) 4 MG tablet [id number] No Take 4 mg by mouth daily.  Patient not taking: Reported on 06/18/2020   [provider] Not Taking Active Self  feeding supplement (ENSURE ENLIVE / ENSURE PLUS) LIQD [id number] Yes Take 237 mLs by mouth 2 (two) times daily between meals. Sue Jansky, MD Taking Active   folic acid (FOLVITE) 1 MG tablet [id number] Yes Take 1 tablet (1 mg total) by mouth daily. Sue Filler, MD Taking Active Self  furosemide (LASIX) 20 MG tablet [id number] Yes Take 1 tablet (20 mg total) by mouth daily. Sue Jansky, MD Taking Active   gabapentin (NEURONTIN) 100 MG capsule [id number] Yes Take 1 capsule (100 mg total) by mouth 2 (two) times daily. Sue Jansky, MD Taking Active   lidocaine-prilocaine (EMLA) cream [id number] Yes Apply 1 application topically as needed.  Patient taking differently: Apply 1 application  topically as needed (port).    Magrinat, Sue Dad, MD Taking Active Self  Multiple Vitamin (MULTIVITAMIN WITH MINERALS) TABS tablet [id number] Yes Take 1 tablet by mouth daily. Sue Jansky, MD Taking Active   Nutritional Supplements (FEEDING SUPPLEMENT, KATE FARMS STANDARD 1.4,) LIQD liquid [id number] No Take 325 mLs by mouth 2 (two) times daily between meals.  Patient not taking: Reported on 06/18/2020   Sue Jansky, MD Not Taking Active   nystatin (MYCOSTATIN/NYSTOP) powder [id number] Yes Apply topically 3 (three) times daily. Apply to under panus.  Sue Jansky, MD Taking Active   thiamine 100 MG tablet [id number] Yes Take 1 tablet (100 mg total) by mouth daily. Sue Jansky, MD Taking Active           Assessment:   Goals Addressed              This Visit's Progress   .  "I'm having trouble having BM" (pt-stated)        CARE PLAN ENTRY Medicaid Managed Care (see longitudinal plan of care for additional care plan information)  Current Barriers:  Patient experiencing trouble having bowel movement since returning home from hospital.  Nurse Case Manager Clinical Goal(s):  [name] Over the next 30 days, patient will verbalize understanding of plan for improved bowel function. . Over the next 30 days, patient will work with providers  to address needs related to improved bowel function. . Over the next 30 days, patient will not experience hospital admission. Hospital Admissions in last 6 months = 6 . Over the next 30 days, patient will attend all scheduled medical appointments:  . Over the next 30 days, patient will experience decrease in ED visits. ED visits in last 6 months = 5. . Over the next 30 days, patient will work with CM team pharmacist to address options for improved bowel function.  Interventions:  . Inter-disciplinary care team collaboration (see longitudinal plan of care) . Advised patient to follow up with providers. [name] with pharmacy regarding options for improved bowel function. . Discussed plans with patient for ongoing care management follow up and provided patient with direct contact information for care management team . Pharmacy referral for options to improve bowel function.  Plan:  . Patient will follow up with providers regarding improved bowel function. [name] RNCM will follow up with patient within 7 days.   Initial goal documentation     .  "I'm having trouble sleeping" (pt-stated)        [place] Medicaid Managed Care (see longitudinal plan of care for additional care plan  information)  Current Barriers:  [name] Knowledge Deficits related to sleep deficit . Care Coordination needs related to sleep deficit  Nurse Case Manager Clinical Goal(s):  Over the next 30 days, patient will verbalize understanding of plan for increased sleep. . Over the next 30 days, patient will work with provider team to address needs related to sleep. . Over the next 30 days, patient will not experience hospital admission. Hospital Admissions in last 6 months = 6. . Over the next 30 days, patient will attend all scheduled medical appointments:  . Over the next 30 days, patient will experience decrease in ED visits. ED visits in last 6 months = 5 . Over the next 30days, patient will work with CM team pharmacist to discuss any recommendations for sleep.  Interventions:  . Inter-disciplinary care team collaboration (see longitudinal plan of care) . Advised patient to work with providers for options  related to improved sleep. . Provided education to patient re: improved sleep. [name] with pharmacy regarding options for improved sleep. . Discussed plans with patient for ongoing care management follow up and provided patient with direct contact information for care management team . Pharmacy referral for improved sleep options.  Plan:  . Patient will work with provider teams for improvement. [name] RNCM will follow up with patient within 7 days   Initial goal documentation        Plan: RNCM will follow up with patient within the next 7 days.

## 2020-06-25 ENCOUNTER — Other Ambulatory Visit: Payer: Self-pay

## 2020-06-25 NOTE — Patient Outreach (Signed)
Care Coordination  06/25/2020  [name] 1956-08-30 [id number]  An unsuccessful telephone outreach was attempted today. The patient was referred to the case management team for assistance with care management and care coordination.   Follow Up Plan: The Managed Medicaid care management team will reach out to the patient again over the next 7 days.   Aida Raider RN, BSN [place]  [facility]ator - Managed Medicaid High Risk [phone]

## 2020-06-25 NOTE — Patient Instructions (Signed)
Hi Ms. Revard, - as a part of your Medicaid benefit, you are eligible for care management and care coordination services at no cost or copay. I was unable to reach you by phone today but would be happy to help you with your health related needs. Please feel free to call me at [phone].  A member of the Managed Medicaid care management team will reach out to you again over the next 7 days.   Aida Raider RN, BSN [place]  [facility] - Managed Medicaid High Risk [phone]

## 2020-06-26 ENCOUNTER — Other Ambulatory Visit: Payer: Self-pay

## 2020-06-26 ENCOUNTER — Encounter: Payer: Self-pay | Admitting: Internal Medicine

## 2020-06-26 ENCOUNTER — Ambulatory Visit (INDEPENDENT_AMBULATORY_CARE_PROVIDER_SITE_OTHER): Payer: Medicaid Other | Admitting: Internal Medicine

## 2020-06-26 VITALS — BP 118/72 | HR 87 | Ht 61.0 in | Wt 287.0 lb

## 2020-06-26 DIAGNOSIS — I10 Essential (primary) hypertension: Secondary | ICD-10-CM

## 2020-06-26 DIAGNOSIS — N1831 Chronic kidney disease, stage 3a: Secondary | ICD-10-CM

## 2020-06-26 DIAGNOSIS — I712 Thoracic aortic aneurysm, without rupture, unspecified: Secondary | ICD-10-CM | POA: Insufficient documentation

## 2020-06-26 DIAGNOSIS — I5032 Chronic diastolic (congestive) heart failure: Secondary | ICD-10-CM | POA: Insufficient documentation

## 2020-06-26 DIAGNOSIS — Z01810 Encounter for preprocedural cardiovascular examination: Secondary | ICD-10-CM

## 2020-06-26 DIAGNOSIS — Z6841 Body Mass Index (BMI) 40.0 and over, adult: Secondary | ICD-10-CM

## 2020-06-26 DIAGNOSIS — C50912 Malignant neoplasm of unspecified site of left female breast: Secondary | ICD-10-CM

## 2020-06-26 DIAGNOSIS — Z170 Estrogen receptor positive status [ER+]: Secondary | ICD-10-CM | POA: Diagnosis not present

## 2020-06-26 LAB — BASIC METABOLIC PANEL
BUN/Creatinine Ratio: 15 (ref 12–28)
BUN: 23 mg/dL (ref 8–27)
CO2: 24 mmol/L (ref 20–29)
Calcium: 10 mg/dL (ref 8.7–10.3)
Chloride: 97 mmol/L (ref 96–106)
Creatinine, Ser: 1.58 mg/dL — ABNORMAL HIGH (ref 0.57–1.00)
GFR calc Af Amer: 40 mL/min/{1.73_m2} — ABNORMAL LOW (ref >59)
GFR calc non Af Amer: 34 mL/min/{1.73_m2} — ABNORMAL LOW (ref >59)
Glucose: 150 mg/dL — ABNORMAL HIGH (ref 65–99)
Potassium: 4.2 mmol/L (ref 3.5–5.2)
Sodium: 138 mmol/L (ref 134–144)

## 2020-06-26 LAB — MAGNESIUM: Magnesium: 1.4 mg/dL — ABNORMAL LOW (ref 1.6–2.3)

## 2020-06-26 MED ORDER — FUROSEMIDE 20 MG PO TABS
20.0000 mg | ORAL_TABLET | Freq: Every day | ORAL | 3 refills | Status: DC
Start: 2020-06-26 — End: 2021-06-03

## 2020-06-26 NOTE — Progress Notes (Signed)
Cardiology Office Note:    Date:  06/26/2020   ID:  [name], DOB October 16, 1955, MRN [id number]  PCP:  [name], MD  [facility] Cardiologist:  No primary care provider on file.  Referring MD: [name], MD   CC: Wants bariatric surgery Consulted for the evaluation of diastolic heart failure at the behest of Fulp, Cammie, MD  History of Present Illness:    [name] is a 64 y.o. female with a hx Stage IIA, pT2N1aM0, grade 3, ER/PR positive invasive ductal carcinoma of the left breast s/p radiation and post cyclophosphamide and doxorubicin last 02/25/20 (stopped for poor tolerance), Morbid Obesity, HTN, CKD IIIa, and Diastolic Heart Failure complicated by hyponatremia.Hospitalized in October and DC 06/12/20.  Through course creatinine decreased from 4 down to 2.11 with sodium of 140.  Incidentally diagnosed with mild ascending aortic aneurysm (38 mm; indexed to height 2.45).   Patient notes that now she feels gout in her legs; seeing her PCP tomorrow.  Has been sticking to the low sodium diet and decrease her water intake.  No chest pain, no shortness of breath.  Patient has one episode with smoked [place] breast and had transient leg swelling.    Past Medical History:  Diagnosis Date  . Arthritis   . Cancer ([place]) 11/2019   Left breast  . Diverticulitis   . Family history of ovarian cancer   . Heart murmur    11/26/19 echo: Mild AS. AV mean gradient 12.0 mmHg; however, LVOT gradient 8 mmHg with intracvitary gradient-significant AS is not suspected  . Hypertension     Past Surgical History:  Procedure Laterality Date  . MASTECTOMY WITH AXILLARY LYMPH NODE DISSECTION Left 12/19/2019   Procedure: LEFT MASTECTOMY WITH AXILLARY LYMPH NODE DISSECTION;  Surgeon: [name], MD;  Location: [place];  Service: General;  Laterality: Left;  Marland Kitchen MULTIPLE EXTRACTIONS WITH ALVEOLOPLASTY Bilateral 12/14/2019   Procedure: MULTIPLE EXTRACTION WITH ALVEOLOPLASTY;  Surgeon: [name], DDS;  Location: [place];  Service: Oral Surgery;  Laterality: Bilateral;  . PORTACATH PLACEMENT N/A 12/19/2019   Procedure: INSERTION PORT-A-CATH WITH ULTRASOUND GUIDANCE;  Surgeon: [name], MD;  Location: [place];  Service: General;  Laterality: N/A;  . RADIAL HEAD ARTHROPLASTY Right 06/25/2019   Procedure: RIGHT RADIAL HEAD ARTHROPLASTY;  Surgeon: [name], MD;  Location: [place];  Service: Orthopedics;  Laterality: Right;  . TUBAL LIGATION     Current Medications: Current Meds  Medication Sig  . anastrozole (ARIMIDEX) 1 MG tablet Take 1 tablet (1 mg total) by mouth daily.  Marland Kitchen aspirin EC 81 MG EC tablet Take 1 tablet (81 mg total) by mouth daily. Swallow whole.  . Blood Pressure Monitor DEVI Use as instructed by provider to check bloos pressure daily. ICD: I10  . dexamethasone (DECADRON) 4 MG tablet Take 4 mg by mouth daily.   . feeding supplement (ENSURE ENLIVE / ENSURE PLUS) LIQD Take 237 mLs by mouth 2 (two) times daily between meals.  . folic acid (FOLVITE) 1 MG tablet Take 1 tablet (1 mg total) by mouth daily.  . furosemide (LASIX) 20 MG tablet Take 1 tablet (20 mg total) by mouth daily.  Marland Kitchen gabapentin (NEURONTIN) 100 MG capsule Take 1 capsule (100 mg total) by mouth 2 (two) times daily.  Marland Kitchen lidocaine-prilocaine (EMLA) cream Apply 1 application topically as needed. (Patient taking differently: Apply 1 application topically as needed (port). )  . Multiple Vitamin (MULTIVITAMIN WITH MINERALS) TABS tablet Take 1 tablet by mouth daily.  Marland Kitchen  Nutritional Supplements (FEEDING SUPPLEMENT, KATE FARMS STANDARD 1.4,) LIQD liquid Take 325 mLs by mouth 2 (two) times daily between meals.  . nystatin (MYCOSTATIN/NYSTOP) powder Apply topically 3 (three) times daily. Apply to under panus.  . thiamine 100 MG tablet Take 1 tablet (100 mg total) by mouth daily.    Allergies:   Amlodipine   Social History   Socioeconomic History  . Marital status: Legally Separated    Spouse name: Not on  file  . Number of children: Not on file  . Years of education: Not on file  . Highest education level: Not on file  Occupational History  . Not on file  Tobacco Use  . Smoking status: Never Smoker  . Smokeless tobacco: Never Used  Vaping Use  . Vaping Use: Never used  Substance and Sexual Activity  . Alcohol use: Yes    Alcohol/week: 3.0 standard drinks    Types: 1 Cans of beer, 2 Shots of liquor per week    Comment: daily  . Drug use: Yes    Types: Marijuana  . Sexual activity: Not Currently  Other Topics Concern  . Not on file  Social History Narrative  . Not on file   Social Determinants of Health   Financial Resource Strain:   . Difficulty of Paying Living Expenses: Not on file  Food Insecurity:   . Worried About Charity fundraiser in the Last Year: Not on file  . Ran Out of Food in the Last Year: Not on file  Transportation Needs: Unmet Transportation Needs  . Lack of Transportation (Medical): Yes  . Lack of Transportation (Non-Medical): Yes  Physical Activity:   . Days of Exercise per Week: Not on file  . Minutes of Exercise per Session: Not on file  Stress:   . Feeling of Stress : Not on file  Social Connections:   . Frequency of Communication with Friends and Family: Not on file  . Frequency of Social Gatherings with Friends and Family: Not on file  . Attends Religious Services: Not on file  . Active Member of Clubs or Organizations: Not on file  . Attends Archivist Meetings: Not on file  . Marital Status: Not on file    Family History: The patient's family history includes Dementia in her mother; Diabetes in her half-sister; Hypertension in her mother; Ovarian cancer (age of onset: 59) in her half-sister; Stroke in her half-sister.   ROS:   Please see the history of present illness.    All other systems reviewed and are negative.  EKGs/Labs/Other Studies Reviewed:    The following studies were reviewed today:   EKG:   06/01/20 sinus  rhythm rate 88 borderline evidence of anterior infarct with diffuse TWI  Recent Labs: 04/28/2020: B Natriuretic Peptide 214.4 06/01/2020: TSH 4.685 06/10/2020: Hemoglobin 7.6; Platelets 196 06/11/2020: Magnesium 2.2 06/12/2020: ALT 96; BUN 83; Creatinine, Ser 2.11; Potassium 4.3; Sodium 140  Recent Lipid Panel    Component Value Date/Time   CHOL 216 (H) 02/10/2020 1536   TRIG 102 02/10/2020 1536   HDL 100 02/10/2020 1536   CHOLHDL 2.2 02/10/2020 1536   VLDL 20 02/10/2020 1536   LDLCALC 96 02/10/2020 1536   06/04/20 Echo Mild AA dilation, index 2.45 1. Left ventricular ejection fraction, by estimation, is 60 to 65%. The  left ventricle has normal function. The left ventricle has no regional  wall motion abnormalities. Left ventricular diastolic parameters are  consistent with Grade II diastolic  dysfunction (pseudonormalization).  Elevated left ventricular end-diastolic  pressure.  2. Right ventricular systolic function is normal. The right ventricular  size is normal. There is moderately elevated pulmonary artery systolic  pressure.  3. Left atrial size was mildly dilated.  4. The mitral valve is normal in structure. Trivial mitral valve  regurgitation. No evidence of mitral stenosis.  5. The aortic valve was not well visualized. Aortic valve regurgitation  is not visualized. Mild aortic valve stenosis.  6. Aortic dilatation noted. There is mild dilatation of the ascending  aorta, measuring 38 mm.  7. The inferior vena cava is normal in size with greater than 50%  respiratory variability, suggesting right atrial pressure of 3 mmHg.    Physical Exam:    VS:  BP 118/72   Pulse 87   Ht 5\' 1"  (1.549 m)   Wt 287 lb (130.2 kg) Comment: PT STATED WEIGHT  SpO2 98%   BMI 54.23 kg/m     Wt Readings from Last 3 Encounters:  06/26/20 287 lb (130.2 kg)  06/11/20 287 lb 7.7 oz (130.4 kg)  05/14/20 (!) 305 lb 4.8 oz (138.5 kg)     GEN: Obese female well developed in no  acute distress HEENT: Normal NECK: No JVD at 60 degrees; No carotid bruits LYMPHATICS: No lymphadenopathy CARDIAC: RRR, no murmurs, rubs, gallops RESPIRATORY:  Clear to auscultation without rales, wheezing or rhonchi  ABDOMEN: Soft, non-tender, non-distended MUSCULOSKELETAL:  No edema left leg; right leg 8/10 to touch SKIN: Warm and dry NEUROLOGIC:  Alert and oriented x 3 PSYCHIATRIC:  Normal affect   ASSESSMENT:    1. Chronic heart failure with preserved ejection fraction ([place])   2. Morbid obesity with BMI of 60.0-69.9, adult ([place])   3. Chronic kidney disease, stage 3a ([place])   4. Malignant neoplasm of left breast in female, estrogen receptor positive, unspecified site of breast ([place])   5. Essential hypertension   6. Thoracic aortic aneurysm without rupture ([place])   7. Preoperative cardiovascular examination    PLAN:    In order of problems listed above:  Heart Failure with Preserved Ejection Fraction Breast Cancer with prior cardiotoxic medications and radiation CKD HTN Morbid Obesity - continue lasix 20 mg daily - will check BMP and Magnesium - continue diet and exercise plan; discussed her ensure at length  Asymptomatic Thoracic aortic aneurysm - Last at 38 mm but with 2.45 index size, rate of growth unclear - 1st degree relative one time screening  - Discussed not using Fluoroquinolones - will get echo in 6 months  Preoperative Risk Assessment - The Revised Cardiac Risk Index = 2 (CHF,Scr >2), which equates to 2=6.6%: moderate risk of perioperative myocardial infarction, pulmonary edema, ventricular fibrillation, cardiac arrest, or complete heart block.  - DASI 7.2 with fMETs 3.63; representing low functional activity - Moderate risk for elective surgery; given AKI, would defer ischemic work up presently - If bariatric surgery is pursed at this time our service is available as needed in the peri-operative period.   - if not received surgery, we will revisit this at 6  month   Medication Adjustments/Labs and Tests Ordered: Current medicines are reviewed at length with the patient today.  Concerns regarding medicines are outlined above.  No orders of the defined types were placed in this encounter.  No orders of the defined types were placed in this encounter.   Patient Instructions  Medication Instructions:  Your physician recommends that you continue on your current medications as directed. Please refer to the  Current Medication list given to you today.  *If you need a refill on your cardiac medications before your next appointment, please call your pharmacy*  Lab Work: You will have labs drawn today: BMET/Magnesium  If you have labs (blood work) drawn today and your tests are completely normal, you will receive your results only by: Marland Kitchen MyChart Message (if you have MyChart) OR . A paper copy in the mail If you have any lab test that is abnormal or we need to change your treatment, we will call you to review the results.   Testing/Procedures: Your physician has requested that you have an echocardiogram in 6 months. Echocardiography is a painless test that uses sound waves to create images of your heart. It provides your doctor with information about the size and shape of your heart and how well your heart's chambers and valves are working. This procedure takes approximately one hour. There are no restrictions for this procedure.  Follow-Up: At [facility], you and your health needs are our priority.  As part of our continuing mission to provide you with exceptional heart care, we have created designated Provider Care Teams.  These Care Teams include your primary Cardiologist (physician) and Advanced Practice Providers (APPs -  Physician Assistants and Nurse Practitioners) who all work together to provide you with the care you need, when you need it.  Your next appointment:   6 month(s)  The format for your next appointment:   In  Person  Provider:   Rudean Haskell, MD      Signed, [name], MD  06/26/2020 8:33 AM    [place]

## 2020-06-26 NOTE — Patient Instructions (Addendum)
Medication Instructions:  Your physician recommends that you continue on your current medications as directed. Please refer to the Current Medication list given to you today.  *If you need a refill on your cardiac medications before your next appointment, please call your pharmacy*  Lab Work: You will have labs drawn today: BMET/Magnesium  Testing/Procedures: Your physician has requested that you have an echocardiogram in 6 months. Echocardiography is a painless test that uses sound waves to create images of your heart. It provides your doctor with information about the size and shape of your heart and how well your heart's chambers and valves are working. This procedure takes approximately one hour. There are no restrictions for this procedure.  Follow-Up: At [facility], you and your health needs are our priority.  As part of our continuing mission to provide you with exceptional heart care, we have created designated Provider Care Teams.  These Care Teams include your primary Cardiologist (physician) and Advanced Practice Providers (APPs -  Physician Assistants and Nurse Practitioners) who all work together to provide you with the care you need, when you need it.  Your next appointment:   6 month(s)  The format for your next appointment:   In Person  Provider:   Rudean Haskell, MD

## 2020-06-27 ENCOUNTER — Encounter: Payer: Self-pay | Admitting: Family Medicine

## 2020-06-27 ENCOUNTER — Telehealth (HOSPITAL_BASED_OUTPATIENT_CLINIC_OR_DEPARTMENT_OTHER): Payer: Medicaid Other | Admitting: Family Medicine

## 2020-06-27 DIAGNOSIS — Z09 Encounter for follow-up examination after completed treatment for conditions other than malignant neoplasm: Secondary | ICD-10-CM

## 2020-06-27 MED ORDER — PREDNISONE 20 MG PO TABS: ORAL_TABLET | ORAL | 0 refills | Status: DC

## 2020-06-27 MED ORDER — HYDROCODONE-ACETAMINOPHEN 5-325 MG PO TABS: 1.0000 | ORAL_TABLET | Freq: Four times a day (QID) | ORAL | 0 refills | Status: AC | PRN

## 2020-06-27 NOTE — Progress Notes (Signed)
Virtual Visit via Telephone Note  I connected with [name] on 06/27/20 at 11:10 AM EDT by telephone and verified that I am speaking with the correct person using two identifiers.   I discussed the limitations, risks, security and privacy concerns of performing an evaluation and management service by telephone and the availability of in person appointments. I also discussed with the patient that there may be a patient responsible charge related to this service. The patient expressed understanding and agreed to proceed.  Patient Location: Home Provider Location: [facility] Office Others participating in call: none   History of Present Illness:         64 year old medically complex patient who is status post recent hospital admission from 10-20 21 through 06/12/2020 at [facility] with active problems during hospitalization including hypertension, alcohol dependence with alcoholic liver disease, edema, left breast cancer, morbid obesity, acute renal failure, thrombocytopenia, debility, anemia of chronic disease, chronic kidney disease stage IIIa, hyponatremia and pancytopenia.  She also has congestive heart failure and presented to the emergency department due to increased peripheral edema and burning sensation on the bottom of her feet.  She was treated for CHF exacerbation as well as acute on chronic kidney disease.            She reports that she was unable to come into the office today to be seen secondary to what she believes is an acute gout flareup.  She has had increased pain in the right foot and ankle area along with swelling.  She denies any injury to the foot or ankle.  She states that the pain is greater than a 10 and she cannot bear weight on her right foot.  Patient wishes to have pain medications at the pharmacy.  She did have recent follow-up with cardiology yesterday and has had blood work.  She denies any shortness of breath or cough.  No chest pain or palpitations.   She has had no significant increase in lower extremity swelling.  She reports that she is receiving visits from home health.         Past Medical History:  Diagnosis Date  . Arthritis   . Cancer ([place]) 11/2019   Left breast  . Diverticulitis   . Family history of ovarian cancer   . Heart murmur    11/26/19 echo: Mild AS. AV mean gradient 12.0 mmHg; however, LVOT gradient 8 mmHg with intracvitary gradient-significant AS is not suspected  . Hypertension     Past Surgical History:  Procedure Laterality Date  . MASTECTOMY WITH AXILLARY LYMPH NODE DISSECTION Left 12/19/2019   Procedure: LEFT MASTECTOMY WITH AXILLARY LYMPH NODE DISSECTION;  Surgeon: [name], MD;  Location: [place];  Service: General;  Laterality: Left;  Marland Kitchen MULTIPLE EXTRACTIONS WITH ALVEOLOPLASTY Bilateral 12/14/2019   Procedure: MULTIPLE EXTRACTION WITH ALVEOLOPLASTY;  Surgeon: [name], DDS;  Location: [place];  Service: Oral Surgery;  Laterality: Bilateral;  . PORTACATH PLACEMENT N/A 12/19/2019   Procedure: INSERTION PORT-A-CATH WITH ULTRASOUND GUIDANCE;  Surgeon: [name], MD;  Location: [place];  Service: General;  Laterality: N/A;  . RADIAL HEAD ARTHROPLASTY Right 06/25/2019   Procedure: RIGHT RADIAL HEAD ARTHROPLASTY;  Surgeon: [name], MD;  Location: [place];  Service: Orthopedics;  Laterality: Right;  . TUBAL LIGATION      Family History  Problem Relation Age of Onset  . Hypertension Mother   . Dementia Mother   . Ovarian cancer Half-Sister 74  . Diabetes Half-Sister   .  Stroke Half-Sister     Social History   Tobacco Use  . Smoking status: Never Smoker  . Smokeless tobacco: Never Used  Vaping Use  . Vaping Use: Never used  Substance Use Topics  . Alcohol use: Yes    Alcohol/week: 3.0 standard drinks    Types: 1 Cans of beer, 2 Shots of liquor per week    Comment: daily  . Drug use: Yes    Types: Marijuana     Allergies  Allergen Reactions  . Amlodipine Swelling    BLE edema         Observations/Objective: No vital signs or physical exam conducted as visit was done via telephone  Assessment and Plan: 1. Hospital discharge follow-up 2. Chronic heart failure with preserved ejection fraction [facility]) Patient's hospital records reviewed and discussed with patient at today's visit.  She is status post hospitalization with acute fluid overload/CHF exacerbation and acute kidney injury along with other chronic medical issues.  She was treated during hospitalization with Lasix and metolazone.  Patient also had hyponatremia with serum sodium of 119 on presentation at the ED and lab work done yesterday by cardiology with normal sodium level of 138.  She reports no current issues with increased shortness of breath or increased peripheral edema other than recent onset of right foot swelling which she believes is related to gallop.  Patient's cardiology note from yesterday, 06/26/2020 reviewed along with labs done at that visit.  Her CHF appears to be stable at this time.  3. Anemia of chronic disease; 7.  Alcoholic liver disease Patient with anemia of chronic disease which is thought to be also related to her chronic kidney disease.  During hospitalization, patient with hemoglobin ranging between 7.47.6 with normal MCV.  She does have outpatient nephrology follow-up.  Hospital discharge summary also suggest outpatient gastroenterology follow-up of her anemia and patient with alcoholic liver disease.  Gastroenterology referral placed. - Ambulatory referral to Gastroenterology  4. Chronic kidney disease, stage 3a ([place]) Patient had acute on chronic elevation in creatinine during recent hospitalization with creatinine of 4.49 at recent hospital admission.  She did have nephrology consultation during hospitalization and has outpatient follow-up.  Review of chart, she did have repeat BMP yesterday by cardiology with creatinine at 1.58.  She is aware of the need to avoid nonsteroidal  anti-inflammatories and keep outpatient follow-up with nephrology.  5. Malignant neoplasm of upper-outer quadrant of left breast in female, estrogen receptor positive ([place]) Patient's breast cancer is followed by and managed by oncology, recent notes reviewed.  6. Acute gout of right foot, unspecified cause Patient reports acute pain and swelling in the right foot consistent with past gout flareups.  Prescription will be sent to patient's pharmacy for prednisone as she cannot take allopurinol due to her chronic kidney disease.  Additional prescription sent for hydrocodone-acetaminophen 5-325 mg to take 1 every 6 hours as needed for up to 5 days.  She was made aware that if her pain increases or she has any other concerns that she should go to urgent care or emergency department for evaluation. - predniSONE (DELTASONE) 20 MG tablet; 2 pills once daily x5 days.  Take after eating  Dispense: 10 tablet; Refill: 0 - HYDROcodone-acetaminophen (NORCO/VICODIN) 5-325 MG tablet; Take 1 tablet by mouth every 6 (six) hours as needed for up to 5 days for moderate pain.  Dispense: 20 tablet; Refill: 0    Follow Up Instructions:Return in about 4 weeks (around 07/25/2020) for Chronic issues; urgent care/ED  if acute worsening of symptoms.    I discussed the assessment and treatment plan with the patient. The patient was provided an opportunity to ask questions and all were answered. The patient agreed with the plan and demonstrated an understanding of the instructions.   The patient was advised to call back or seek an in-person evaluation if the symptoms worsen or if the condition fails to improve as anticipated.  I provided 14 minutes of non-face-to-face time during this encounter.  An additional 15 minutes was spent on review of hospital records, outpatient specialty notes and recent labs.   [name], MD

## 2020-06-27 NOTE — Progress Notes (Signed)
UNABLE TO WALK ON RIGHT FOOT FEELS LIKE GOUT FLARE

## 2020-06-30 ENCOUNTER — Telehealth: Payer: Self-pay

## 2020-06-30 MED ORDER — MAGNESIUM OXIDE 400 MG PO CAPS: 400.0000 mg | ORAL_CAPSULE | Freq: Every day | ORAL | 3 refills | Status: DC

## 2020-06-30 NOTE — Telephone Encounter (Signed)
[name], MD  Antonieta Iba, RN GFR has improved to 30 +  Mag Oxide 400mg  once daily should be reasonable; as she will also lose some with the lasix  Thx

## 2020-06-30 NOTE — Telephone Encounter (Signed)
-----   Message from [name], MD sent at 06/27/2020  1:02 PM EDT ----- Results: Improved kidney function; hypomagnesemia Plan: Oral magnesium supplementation start while on lasix  [name], MD

## 2020-07-01 ENCOUNTER — Telehealth: Payer: Self-pay | Admitting: Family Medicine

## 2020-07-01 NOTE — Telephone Encounter (Signed)
Copied from [place] [id number]. Topic: General - Other >> Jul 01, 2020 10:12 AM [place] A wrote: Patient stated that she cant walk and that she is sending Ezequiel Essex to office to give paperwork to be filled out by Dr. Chapman Fitch a nurse aid and wheelchair ramp

## 2020-07-03 ENCOUNTER — Other Ambulatory Visit: Payer: Self-pay | Admitting: Obstetrics and Gynecology

## 2020-07-03 ENCOUNTER — Other Ambulatory Visit: Payer: Self-pay

## 2020-07-03 NOTE — Patient Instructions (Signed)
Hi Ms. Muckle you for speaking with me today.  Ms. Poulton  was given information about Medicaid Managed Care team care coordination services as a part of their Healthy [facility] Medicaid benefit. [name] verbally consented to engagement with the [facility] Managed Care team.   For questions related to your [facility], please call: [phone] or visit the homepage here: [email or web address]  If you would like to schedule transportation through your [facility], please call the following number at least 2 days in advance of your appointment: [phone]  Goals Addressed              This Visit's Progress   .  "I'm having trouble having BM" (pt-stated)        CARE PLAN ENTRY Medicaid Managed Care (see longitudinal plan of care for additional care plan information)  Current Barriers:  Patient experiencing trouble having bowel movement since returning home from hospital.  Nurse Case Manager Clinical Goal(s):  Marland Kitchen Over the next 30 days, patient will verbalize understanding of plan for improved bowel function.Update: patient states she is no longer having difficulty. . Over the next 30 days, patient will work with providers  to address needs related to improved bowel function. . Over the next 30 days, patient will not experience hospital admission. Hospital Admissions in last 6 months = 6 . Over the next 30 days, patient will attend all scheduled medical appointments:  . Over the next 30 days, patient will experience decrease in ED visits. ED visits in last 6 months = 5. . Over the next 30 days, patient will work with CM team pharmacist to address options for improved bowel function.  Interventions:  . Inter-disciplinary care team collaboration (see longitudinal plan of care) . Advised patient to follow up with providers. Nash Dimmer with pharmacy regarding options for  improved bowel function. . Discussed plans with patient for ongoing care management follow up and provided patient with direct contact information for care management team . Pharmacy referral for options to improve bowel function.  Plan:  . Patient will follow up with providers regarding improved bowel function. Marland Kitchen RNCM will follow up with patient within 7 days.   Please see past updates related to this goal by clicking on the "Past Updates" button in the selected goal     .  "I'm having trouble sleeping" (pt-stated)        [place] (see longitudinal plan of care for additional care plan information)  Current Barriers:  Marland Kitchen Knowledge Deficits related to sleep deficit . Care Coordination needs related to sleep deficit  Nurse Case Manager Clinical Goal(s):  Over the next 30 days, patient will verbalize understanding of plan for increased sleep. . Over the next 30 days, patient will work with provider team to address needs related to sleep.  Update:  patient states she is sleeping better. . Over the next 30 days, patient will not experience hospital admission. Hospital Admissions in last 6 months = 6. . Over the next 30 days, patient will attend all scheduled medical appointments:  . Over the next 30 days, patient will experience decrease in ED visits. ED visits in last 6 months = 5 . Over the next 30days, patient will work with CM team pharmacist to discuss any recommendations for sleep.  Interventions:  . Inter-disciplinary care team collaboration (see longitudinal plan of care) . Advised patient to work with providers for options related to improved sleep. Marland Kitchen  Provided education to patient re: improved sleep. Nash Dimmer with pharmacy regarding options for improved sleep. . Discussed plans with patient for ongoing care management follow up and provided patient with direct contact information for care management team . Pharmacy referral for improved sleep  options.  Plan:  . Patient will work with provider teams for improvement. Marland Kitchen RNCM will follow up with patient within 7 days   Please see past updates related to this goal by clicking on the "Past Updates" button in the selected goal     .  "My foot is tender but feeling better and swelling has decreased." (pt-stated)        [place] (see longitudinal plan of care for additional care plan information)  Current Barriers:  . Patient states she has gout and is taking prescribed medication with improvement.  She states she continues PT with [facility] once a week and is doing home exercises twice a week.  She is walking with a walker in her apartment and fixing her meals.   Patient is trying to obtain a 2 bedroom apartment on the first floor.  Nurse Case Manager Clinical Goal(s):  Marland Kitchen Over the next 30 days, patient will verbalize understanding of plan for improved mobility with decreased pain.  Interventions:  . Inter-disciplinary care team collaboration (see longitudinal plan of care) . Advised patient to continue plan per provider. . Discussed plans with patient for ongoing care management follow up and provided patient with direct contact information for care management team . Care Guide referral for housing-patient trying to obtain a 2 bedroom apartment on first floor.  She is also working with [employer] [facility] case worker to obtain necessary paperwork.  Plan:  . Patient will have continued improvement in tenderness and mobility. Marland Kitchen RNCM will follow up with patient within 14 days.  Initial goal documentation        The Managed Medicaid care management team will reach out to the patient again over the next 14 days.   Aida Raider RN, BSN [place]  [facility] [employer] - Managed Medicaid High Risk [phone].

## 2020-07-03 NOTE — Patient Outreach (Signed)
Care Coordination - Case Manager  07/03/2020  [name] 21-Nov-1955 [id number]  Subjective:  [name] is an 64 y.o. year old female who is a primary patient of Fulp, Ander Gaster, MD.  [name] was given information about Medicaid Managed Care team care coordination services today. [name] agreed to services and verbal consent obtained  Review of patient status, laboratory and other test data was performed as part of evaluation for provision of services.  SDOH: SDOH Screenings   Alcohol Screen:   . Last Alcohol Screening Score (AUDIT): Not on file  Depression (PHQ2-9): Low Risk   . PHQ-2 Score: 2  Financial Resource Strain:   . Difficulty of Paying Living Expenses: Not on file  Food Insecurity:   . Worried About Charity fundraiser in the Last Year: Not on file  . Ran Out of Food in the Last Year: Not on file  Housing:   . Last Housing Risk Score: Not on file  Physical Activity:   . Days of Exercise per Week: Not on file  . Minutes of Exercise per Session: Not on file  Social Connections:   . Frequency of Communication with Friends and Family: Not on file  . Frequency of Social Gatherings with Friends and Family: Not on file  . Attends Religious Services: Not on file  . Active Member of Clubs or Organizations: Not on file  . Attends Archivist Meetings: Not on file  . Marital Status: Not on file  Stress:   . Feeling of Stress : Not on file  Tobacco Use: Low Risk   . Smoking Tobacco Use: Never Smoker  . Smokeless Tobacco Use: Never Used  Transportation Needs: Unmet Transportation Needs  . Lack of Transportation (Medical): Yes  . Lack of Transportation (Non-Medical): Yes     Objective:    Allergies  Allergen Reactions  . Amlodipine Swelling    BLE edema    Medications:    Medications Reviewed Today    Reviewed by Gayla Medicus, RN (Registered Nurse) on 07/03/20 at 1450  Med List Status: <None>  Medication Order Taking? Sig Documenting  Provider Last Dose Status Informant  anastrozole (ARIMIDEX) 1 MG tablet [id number] Yes Take 1 tablet (1 mg total) by mouth daily. Magrinat, Virgie Dad, MD Taking Active Self  aspirin EC 81 MG EC tablet [id number] Yes Take 1 tablet (81 mg total) by mouth daily. Swallow whole. [name], MD Taking Active Self  Blood Pressure Monitor DEVI [id number] Yes Use as instructed by provider to check bloos pressure daily. ICD: I10 Fulp, Ander Gaster, MD Taking Active Self  dexamethasone (DECADRON) 4 MG tablet [id number] Yes Take 4 mg by mouth daily.  [provider] Taking Active Self  feeding supplement (ENSURE ENLIVE / ENSURE PLUS) LIQD [id number] Yes Take 237 mLs by mouth 2 (two) times daily between meals. [name], MD Taking Active   folic acid (FOLVITE) 1 MG tablet [id number] Yes Take 1 tablet (1 mg total) by mouth daily. Eugenie Filler, MD Taking Active Self  furosemide (LASIX) 20 MG tablet [id number] Yes Take 1 tablet (20 mg total) by mouth daily. [name], MD Taking Active   gabapentin (NEURONTIN) 100 MG capsule [id number] Yes Take 1 capsule (100 mg total) by mouth 2 (two) times daily. [name], MD Taking Active   lidocaine-prilocaine (EMLA) cream [id number] Yes Apply 1 application topically as needed.  Patient taking differently: Apply 1 application topically as needed (port).    Magrinat, Sarajane Jews  C, MD Taking Active Self  Magnesium Oxide 400 MG CAPS [id number] Yes Take 1 capsule (400 mg total) by mouth daily. [name], MD Taking Active   Multiple Vitamin (MULTIVITAMIN WITH MINERALS) TABS tablet [id number] Yes Take 1 tablet by mouth daily. [name], MD Taking Active   Nutritional Supplements (FEEDING SUPPLEMENT, KATE FARMS STANDARD 1.4,) LIQD liquid [id number] Yes Take 325 mLs by mouth 2 (two) times daily between meals. [name], MD Taking Active   nystatin (MYCOSTATIN/NYSTOP) powder [id number] Yes Apply topically 3 (three) times daily.  Apply to under panus. [name], MD Taking Active   predniSONE (DELTASONE) 20 MG tablet [id number] No 2 pills once daily x5 days.  Take after eating  Patient not taking: Reported on 07/03/2020   [name], MD Not Taking Active   thiamine 100 MG tablet [id number] Yes Take 1 tablet (100 mg total) by mouth daily. [name], MD Taking Active           Assessment:   Goals Addressed              This Visit's Progress   .  "I'm having trouble having BM" (pt-stated)        CARE PLAN ENTRY Medicaid Managed Care (see longitudinal plan of care for additional care plan information)  Current Barriers:  Patient experiencing trouble having bowel movement since returning home from hospital.  Nurse Case Manager Clinical Goal(s):  Marland Kitchen Over the next 30 days, patient will verbalize understanding of plan for improved bowel function.Update: patient states she is no longer having difficulty. . Over the next 30 days, patient will work with providers  to address needs related to improved bowel function. . Over the next 30 days, patient will not experience hospital admission. Hospital Admissions in last 6 months = 6 . Over the next 30 days, patient will attend all scheduled medical appointments:  . Over the next 30 days, patient will experience decrease in ED visits. ED visits in last 6 months = 5. . Over the next 30 days, patient will work with CM team pharmacist to address options for improved bowel function.  Interventions:  . Inter-disciplinary care team collaboration (see longitudinal plan of care) . Advised patient to follow up with providers. Nash Dimmer with pharmacy regarding options for improved bowel function. . Discussed plans with patient for ongoing care management follow up and provided patient with direct contact information for care management team . Pharmacy referral for options to improve bowel function.  Plan:  . Patient will follow up with providers regarding  improved bowel function. Marland Kitchen RNCM will follow up with patient within 7 days.   Please see past updates related to this goal by clicking on the "Past Updates" button in the selected goal     .  "I'm having trouble sleeping" (pt-stated)        [place] (see longitudinal plan of care for additional care plan information)  Current Barriers:  Marland Kitchen Knowledge Deficits related to sleep deficit . Care Coordination needs related to sleep deficit  Nurse Case Manager Clinical Goal(s):  Over the next 30 days, patient will verbalize understanding of plan for increased sleep. . Over the next 30 days, patient will work with provider team to address needs related to sleep.  Update:  patient states she is sleeping better. . Over the next 30 days, patient will not experience hospital admission. Hospital Admissions in last 6 months = 6. . Over the  next 30 days, patient will attend all scheduled medical appointments:  . Over the next 30 days, patient will experience decrease in ED visits. ED visits in last 6 months = 5 . Over the next 30days, patient will work with CM team pharmacist to discuss any recommendations for sleep.  Interventions:  . Inter-disciplinary care team collaboration (see longitudinal plan of care) . Advised patient to work with providers for options related to improved sleep. . Provided education to patient re: improved sleep. Nash Dimmer with pharmacy regarding options for improved sleep. . Discussed plans with patient for ongoing care management follow up and provided patient with direct contact information for care management team . Pharmacy referral for improved sleep options.  Plan:  . Patient will work with provider teams for improvement. Marland Kitchen RNCM will follow up with patient within 7 days   Please see past updates related to this goal by clicking on the "Past Updates" button in the selected goal     .  "My foot is tender but feeling better and  swelling has decreased." (pt-stated)        [place] (see longitudinal plan of care for additional care plan information)  Current Barriers:  . Patient states she has gout and is taking prescribed medication with improvement.  She states she continues PT with [facility] once a week and is doing home exercises twice a week.  She is walking with a walker in her apartment and fixing her meals.   Patient is trying to obtain a 2 bedroom apartment on the first floor.  Nurse Case Manager Clinical Goal(s):  Marland Kitchen Over the next 30 days, patient will verbalize understanding of plan for improved mobility with decreased pain.  Interventions:  . Inter-disciplinary care team collaboration (see longitudinal plan of care) . Advised patient to continue plan per provider. . Discussed plans with patient for ongoing care management follow up and provided patient with direct contact information for care management team . Care Guide referral for housing-patient trying to obtain a 2 bedroom apartment on first floor.  She is also working with [employer] and Wellness case worker to obtain necessary paperwork.  Plan:  . Patient will have continued improvement in tenderness and mobility. Marland Kitchen RNCM will follow up with patient within 14 days.  Initial goal documentation        Plan: RNCM will follow up with patient within 14 days.

## 2020-07-04 ENCOUNTER — Telehealth: Payer: Self-pay | Admitting: Family Medicine

## 2020-07-04 ENCOUNTER — Other Ambulatory Visit: Payer: Self-pay | Admitting: Family Medicine

## 2020-07-04 ENCOUNTER — Other Ambulatory Visit: Payer: Self-pay

## 2020-07-04 DIAGNOSIS — I872 Venous insufficiency (chronic) (peripheral): Secondary | ICD-10-CM

## 2020-07-04 NOTE — Telephone Encounter (Signed)
Requested medication (s) are due for refill today: ye  Requested medication (s) are on the active medication list: no  Last refill:  06/09/20  Future visit scheduled: no  Notes to clinic:  d/c'd per Dr Everlene Farrier [facility] 06/12/20    Requested Prescriptions  Pending Prescriptions Disp Refills   ibuprofen (ADVIL) 600 MG tablet [Pharmacy Med Name: IBUPROFEN 600 MG TABLET] 60 tablet 1    Sig: Take 1 tablet (600 mg total) by mouth every 12 (twelve) hours as needed.      Analgesics:  NSAIDS Failed - 07/04/2020  1:30 PM      Failed - Cr in normal range and within 360 days    Creatinine  Date Value Ref Range Status  11/21/2019 1.59 (H) 0.44 - 1.00 mg/dL Final   Creatinine, Ser  Date Value Ref Range Status  06/26/2020 1.58 (H) 0.57 - 1.00 mg/dL Final   Creatinine,U  Date Value Ref Range Status  05/17/2008 128.7 mg/dL Final    Comment:    See lab report for associated comment(s)   Creatinine, Urine  Date Value Ref Range Status  06/05/2020 37.82 mg/dL Final          Failed - HGB in normal range and within 360 days    Hemoglobin  Date Value Ref Range Status  06/10/2020 7.6 (L) 12.0 - 15.0 g/dL Final  03/25/2020 10.7 (L) 11.1 - 15.9 g/dL Final          Passed - Patient is not pregnant      Passed - Valid encounter within last 12 months    Recent Outpatient Visits           1 week ago Hospital discharge follow-up   [place] Fulp, [place], MD   1 month ago Venous insufficiency of both lower extremities   [place], [place], MD   3 months ago Ulcer of lower extremity with fat layer exposed, unspecified laterality ([place])   [place], [place], MD   6 months ago Open wound of left lower leg, initial encounter   [place], MD   8 months ago Essential hypertension   [place], RPH-CPP

## 2020-07-04 NOTE — Patient Instructions (Signed)
Visit Information  [name] was given information about Medicaid Managed Care team care coordination services as a part of their Healthy [facility] Medicaid benefit. [name] verbally consented to engagement with the [facility] Managed Care team.   For questions related to your [facility], please call: [phone] or visit the homepage here: [email or web address]  If you would like to schedule transportation through your [facility], please call the following number at least 2 days in advance of your appointment: [phone]   Social Worker will follow up with patient in 7 days.Mickel Fuchs, BSW, [place]  High Risk Managed Medicaid Team

## 2020-07-04 NOTE — Telephone Encounter (Signed)
Copied from [place] [id number]. Topic: General - Other >> Jul 04, 2020 11:49 AM Rainey Pines A wrote: Mickel Fuchs with [employer] would like a status update on the form that patient needs filled out in regards to staying in a wheelchair accessible two bedroom apartment. Please advise Best contact [phone]

## 2020-07-04 NOTE — Patient Outreach (Signed)
Care Coordination  07/04/2020  Marcellene Shivley 01-28-1956 [id number]  Peggi Yono is a 64 y.o. year old female who sees Fulp, Cammie, MD for primary care. The  Medicaid Managed Care team was consulted for assistance with Housing barriers. Ms. Hoefling was given information about Care Management services, agreed to services, and verbal consent for services was obtained.  Interventions:  . Patient interviewed and appropriate assessments performed . Collaborated with clinical team regarding patient needs  . SDOH (Social Determinants of Health) assessments performed: Yes    . Collaborated with [employer] to inquire about form patient is needing. (community agency) re: [employer]:  . Over the next 30 days, patient will work with BSW to address needs related to Housing barriers . Social Worker will wait for response from [employer] and contact patient with information.Mickel Fuchs, BSW, [place]  High Risk Managed Medicaid Team

## 2020-07-08 ENCOUNTER — Other Ambulatory Visit: Payer: Self-pay

## 2020-07-08 NOTE — Patient Instructions (Signed)
Visit Information  [name] was given information about Medicaid Managed Care team care coordination services as a part of their Healthy [facility] Medicaid benefit. [name] verbally consented to engagement with the [facility] Managed Care team.   For questions related to your [facility], please call: [phone] or visit the homepage here: [email or web address]  If you would like to schedule transportation through your [facility], please call the following number at least 2 days in advance of your appointment: [phone]   Social Worker will follow up with patient in 30 days.Mickel Fuchs, BSW, [place]  High Risk Managed Medicaid Team

## 2020-07-08 NOTE — Patient Outreach (Signed)
Care Coordination  07/08/2020  Makiyah Zentz Nov 30, 1955 [id number]  Maat Kafer is a 64 y.o. year old female who sees Fulp, Cammie, MD for primary care. The  Medicaid Managed Care team was consulted for assistance with Housing barriers. Ms. Risser was given information about Care Management services, agreed to services, and verbal consent for services was obtained.  Interventions:  . Patient interviewed and appropriate assessments performed . Collaborated with clinical team regarding patient needs  . SDOH (Social Determinants of Health) assessments performed: Yes    . Collaborated with [employer] [facility]. (community agency) re: Housing form. . Provided patient with information about her housing form. BSW received a voicemail from Dr. Chapman Fitch stating that patient's letter for housing had been completed and mailed to housing authority.  Plan:  . Over the next 30days, patient will work with BSW to address needs related to Housing barriers . Social Worker will follow up with patient in 30 days.Mickel Fuchs, BSW, [place]  High Risk Managed Medicaid Team

## 2020-07-08 NOTE — Telephone Encounter (Signed)
Call placed to [employer] and message left that form for [name] has been completed and that the form only has info to return by mail

## 2020-07-08 NOTE — Progress Notes (Signed)
[place]  Telephone:([phone]) [phone] Fax:(336) [phone]     ID: [name] DOB: 1956/04/22  MR#: [id number]  BOF#:[id number]  Patient Care Team: [name], MD as PCP - General (Family Medicine) Mauro Kaufmann, RN as Oncology Nurse Navigator Rockwell Germany, RN as Oncology Nurse Navigator [name], MD as Consulting Physician (General Surgery) Tanyla Stege, Virgie Dad, MD as Consulting Physician (Oncology) Kyung Rudd, MD as Consulting Physician (Radiation Oncology) Edrick Kins, DPM as Consulting Physician (Podiatry) Craft, Lorel Monaco, RN as Case Manager [name] as Social Worker Chauncey Cruel, MD OTHER MD:   CHIEF COMPLAINT: estrogen receptor positive breast cancer  CURRENT TREATMENT: anastrozole; radiation therapy   INTERVAL HISTORY: [name] returns today for follow up of her estrogen receptor positive breast cancer accompanied by her grand [name].. I last saw [name] in the hospital when she was re-admitted for left arm swelling and acute on chronic kidney injury.  She tells me since that time she has been released by both the cardiologist and the nephrologist.  She continues on anastrozole.  She tolerates this with no side effects that she is aware of, particularly no hot flashes, no vaginal dryness symptoms, and no arthralgias or myalgias   REVIEW OF SYSTEMS: Harsimran tells me she is doing "great".  She says the aide comes in but has nothing to do because she can wash herself, dress herself, even does a little bit of house keeping although she cannot do any sweeping.  She has had no unusual headaches visual changes cough and production pleurisy shortness of breath or change in bowel or bladder habits.  The rash between her legs has completely resolved   COVID 19 VACCINATION STATUS: Status post Moderna x2, with the booster October 2021  HISTORY OF CURRENT ILLNESS: From the original intake note:  [name] (pronounced  "yellow") had routine screening mammography on 10/24/2019 showing a possible abnormality in the left breast. She underwent left breast ultrasonography at [facility] on 11/05/2019 showing: suspicious 3.8 cm mass in left breast at 3 o'clock; five abnormal lymph nodes.  Accordingly on 11/12/2019 she proceeded to biopsy of the left breast area in question as well as a lymph node. The pathology from this procedure ([id number]) showed: invasive ductal carcinoma with prominent inflammatory response, grade 3. Prognostic indicators significant for: estrogen receptor, 100% positive and progesterone receptor, 50% positive, both with strong staining intensity. Proliferation marker Ki67 at 70%. HER2 negative by immunohistochemistry (0).  The biopsied lymph node confirmed metastatic breast carcinoma.  The patient's subsequent history is as detailed below.   PAST MEDICAL HISTORY: Past Medical History:  Diagnosis Date  . Arthritis   . Cancer ([place]) 11/2019   Left breast  . Diverticulitis   . Family history of ovarian cancer   . Heart murmur    11/26/19 echo: Mild AS. AV mean gradient 12.0 mmHg; however, LVOT gradient 8 mmHg with intracvitary gradient-significant AS is not suspected  . Hypertension     PAST SURGICAL HISTORY: Past Surgical History:  Procedure Laterality Date  . MASTECTOMY WITH AXILLARY LYMPH NODE DISSECTION Left 12/19/2019   Procedure: LEFT MASTECTOMY WITH AXILLARY LYMPH NODE DISSECTION;  Surgeon: [name], MD;  Location: [place];  Service: General;  Laterality: Left;  Marland Kitchen MULTIPLE EXTRACTIONS WITH ALVEOLOPLASTY Bilateral 12/14/2019   Procedure: MULTIPLE EXTRACTION WITH ALVEOLOPLASTY;  Surgeon: [name], DDS;  Location: [place];  Service: Oral Surgery;  Laterality: Bilateral;  . PORTACATH PLACEMENT N/A 12/19/2019   Procedure: INSERTION PORT-A-CATH  WITH ULTRASOUND GUIDANCE;  Surgeon: [name], MD;  Location: [place];  Service: General;  Laterality: N/A;  . RADIAL HEAD  ARTHROPLASTY Right 06/25/2019   Procedure: RIGHT RADIAL HEAD ARTHROPLASTY;  Surgeon: [name], MD;  Location: [place];  Service: Orthopedics;  Laterality: Right;  . TUBAL LIGATION      FAMILY HISTORY: Family History  Problem Relation Age of Onset  . Hypertension Mother   . Dementia Mother   . Ovarian cancer Half-Sister 68  . Diabetes Half-Sister   . Stroke Half-Sister   Her father died at age 19; she is unsure of the cause of death. Her mother is age 18 as of 10/2019.  The patient has two brothers and four sisters. She reports ovarian cancer in her sister at age 65. her sister at age 14. She denies a family history of breast, prostate, or pancreatic cancer.   GYNECOLOGIC HISTORY:  No LMP recorded. Patient is postmenopausal. Menarche: 64 years old Age at first live birth: 64 years old [place] 2 LMP around age 46 Contraceptive: used previously HRT never used  Hysterectomy? no BSO? no   SOCIAL HISTORY: (updated11/2021)  [name] retired from working at the [employer]. She is divorced. She lives at home  currently with her granddaughter [name]. [name], age 7, lives in [place] and is handicapped. [name], age 30, is a Biomedical scientist here in [place]. [name] has two granddaughters. She attends a local [employer].    ADVANCED DIRECTIVES: not in place; at the 11/21/2019 visit the patient was given the appropriate documents to complete and notarized at her discretion.  She expressed spontaneously that she would not want to be resuscitated in case of a terminal event.    HEALTH MAINTENANCE: Social History   Tobacco Use  . Smoking status: Never Smoker  . Smokeless tobacco: Never Used  Vaping Use  . Vaping Use: Never used  Substance Use Topics  . Alcohol use: Yes    Alcohol/week: 3.0 standard drinks    Types: 1 Cans of beer, 2 Shots of liquor per week    Comment: daily  . Drug use: Yes    Types: Marijuana     Colonoscopy: never done  PAP: "1980?"  Bone density: never  done   Allergies  Allergen Reactions  . Amlodipine Swelling    BLE edema    Current Outpatient Medications  Medication Sig Dispense Refill  . anastrozole (ARIMIDEX) 1 MG tablet Take 1 tablet (1 mg total) by mouth daily. 90 tablet 4  . aspirin EC 81 MG EC tablet Take 1 tablet (81 mg total) by mouth daily. Swallow whole. 30 tablet 11  . Blood Pressure Monitor DEVI Use as instructed by provider to check bloos pressure daily. ICD: I10 1 each 0  . dexamethasone (DECADRON) 4 MG tablet Take 4 mg by mouth daily.     . feeding supplement (ENSURE ENLIVE / ENSURE PLUS) LIQD Take 237 mLs by mouth 2 (two) times daily between meals. 886 mL 12  . folic acid (FOLVITE) 1 MG tablet Take 1 tablet (1 mg total) by mouth daily.    . furosemide (LASIX) 20 MG tablet Take 1 tablet (20 mg total) by mouth daily. 90 tablet 3  . gabapentin (NEURONTIN) 100 MG capsule Take 1 capsule (100 mg total) by mouth 2 (two) times daily. 60 capsule 0  . lidocaine-prilocaine (EMLA) cream Apply 1 application topically as needed. (Patient taking differently: Apply 1 application topically as needed (port). ) 30 g 0  . Magnesium  Oxide 400 MG CAPS Take 1 capsule (400 mg total) by mouth daily. 90 capsule 3  . Multiple Vitamin (MULTIVITAMIN WITH MINERALS) TABS tablet Take 1 tablet by mouth daily.    . Nutritional Supplements (FEEDING SUPPLEMENT, KATE FARMS STANDARD 1.4,) LIQD liquid Take 325 mLs by mouth 2 (two) times daily between meals. 325 mL 12  . nystatin (MYCOSTATIN/NYSTOP) powder Apply topically 3 (three) times daily. Apply to under panus. 60 g 0  . predniSONE (DELTASONE) 20 MG tablet 2 pills once daily x5 days.  Take after eating (Patient not taking: Reported on 07/03/2020) 10 tablet 0  . thiamine 100 MG tablet Take 1 tablet (100 mg total) by mouth daily. 30 tablet 0   No current facility-administered medications for this visit.    OBJECTIVE: African-American woman examined in a wheelchair Vitals:   07/09/20 1232  BP: 126/67   Pulse: 63  Resp: 18  Temp: 97.7 F (36.5 C)  SpO2: 100%     Body mass index is 52.45 kg/m.   Wt Readings from Last 3 Encounters:  07/09/20 277 lb 9.6 oz (125.9 kg)  06/26/20 287 lb (130.2 kg)  06/11/20 287 lb 7.7 oz (130.4 kg)   Sclerae unicteric, EOMs intact No cervical or supraclavicular adenopathy Lungs no rales or rhonchi Heart regular rate and rhythm Abd soft, obese, nontender, positive bowel sounds Neuro: nonfocal, well oriented, appropriate affect Breasts: The right breast is unremarkable.  The left breast is status post mastectomy and radiation.  There is no evidence of local recurrence.  Both axillae are benign   LAB RESULTS:  CMP     Component Value Date/Time   NA 141 07/09/2020 1200   NA 138 06/26/2020 0843   K 3.8 07/09/2020 1200   CL 108 07/09/2020 1200   CO2 24 07/09/2020 1200   GLUCOSE 104 (H) 07/09/2020 1200   BUN 31 (H) 07/09/2020 1200   BUN 23 06/26/2020 0843   CREATININE 1.75 (H) 07/09/2020 1200   CREATININE 1.59 (H) 11/21/2019 0908   CALCIUM 9.2 07/09/2020 1200   PROT 6.8 07/09/2020 1200   PROT 8.5 06/15/2019 1622   ALBUMIN 2.8 (L) 07/09/2020 1200   ALBUMIN 4.3 06/15/2019 1622   AST 19 07/09/2020 1200   AST 61 (H) 11/21/2019 0908   ALT 21 07/09/2020 1200   ALT 32 11/21/2019 0908   ALKPHOS 47 07/09/2020 1200   BILITOT 0.4 07/09/2020 1200   BILITOT 0.5 11/21/2019 0908   GFRNONAA 32 (L) 07/09/2020 1200   GFRNONAA 34 (L) 11/21/2019 0908   GFRAA 40 (L) 06/26/2020 0843   GFRAA 39 (L) 11/21/2019 0908    Lab Results  Component Value Date   TOTALPROTELP 6.9 06/02/2020   ALBUMINELP 3.3 06/02/2020   A1GS 0.2 06/02/2020   A2GS 0.7 06/02/2020   BETS 0.8 06/02/2020   GAMS 1.8 06/02/2020   MSPIKE Not Observed 06/02/2020   SPEI Comment 06/02/2020    Lab Results  Component Value Date   WBC 6.8 07/09/2020   NEUTROABS 4.5 07/09/2020   HGB 8.4 (L) 07/09/2020   HCT 25.4 (L) 07/09/2020   MCV 98.8 07/09/2020   PLT 151 07/09/2020    No results  found for: LABCA2  No components found for: EHUDJS970  No results for input(s): INR in the last 168 hours.  No results found for: LABCA2  No results found for: YOV785  No results found for: CAN125  No results found for: YIF027  Lab Results  Component Value Date   CA2729 10.6 05/15/2020  No components found for: HGQUANT  No results found for: CEA1 / No results found for: CEA1   No results found for: AFPTUMOR  No results found for: [facility]  Lab Results  Component Value Date   KPAFRELGTCHN 131.4 (H) 06/02/2020   LAMBDASER 62.0 (H) 06/02/2020   KAPLAMBRATIO 2.12 (H) 06/02/2020   (kappa/lambda light chains)  No results found for: HGBA, HGBA2QUANT, HGBFQUANT, HGBSQUAN (Hemoglobinopathy evaluation)   Lab Results  Component Value Date   LDH 294 (H) 05/15/2020    Lab Results  Component Value Date   IRON 130 06/01/2020   TIBC 133 (L) 06/01/2020   IRONPCTSAT 97 (H) 06/01/2020   (Iron and TIBC)  Lab Results  Component Value Date   FERRITIN 951 (H) 06/01/2020    Urinalysis    Component Value Date/Time   COLORURINE YELLOW 05/31/2020 1923   APPEARANCEUR CLEAR 05/31/2020 1923   LABSPEC 1.012 05/31/2020 1923   PHURINE 5.0 05/31/2020 1923   GLUCOSEU NEGATIVE 05/31/2020 1923   HGBUR NEGATIVE 05/31/2020 1923   BILIRUBINUR NEGATIVE 05/31/2020 [place] 05/31/2020 1923   PROTEINUR 100 (A) 05/31/2020 1923   UROBILINOGEN 1.0 07/14/2012 1630   NITRITE NEGATIVE 05/31/2020 1923   LEUKOCYTESUR NEGATIVE 05/31/2020 1923     STUDIES: No results found.   ELIGIBLE FOR AVAILABLE RESEARCH PROTOCOL: AET  ASSESSMENT: 64 y.o. [place] woman status post left breast upper outer quadrant and left axillary lymph node biopsies 11/12/2019 for a clinical T2 N2, stage IIIA invasive ductal carcinoma, grade 3, estrogen and progesterone receptor positive, HER-2 not amplified, with an MIB-1 of 70%  (a) the biopsied axillary lymph node was positive  (1) genetics  testing 11/29/2019 through the Breast Cancer STAT Panel offered by [facility] found no deleterious mutations in ATM, BRCA1, BRCA2, CDH1, CHEK2, PALB2, PTEN, STK11 and TP53.  Additional testing through the Common Hereditary Cancers Panel confirmed no deleterious mutations in APC, ATM, AXIN2, BARD1, BMPR1A, BRCA1, BRCA2, BRIP1, CDH1, CDK4, CDKN2A (p14ARF), CDKN2A (p16INK4a), CHEK2, CTNNA1, DICER1, EPCAM (Deletion/duplication testing only), GREM1 (promoter region deletion/duplication testing only), KIT, MEN1, MLH1, MSH2, MSH3, MSH6, MUTYH, NBN, NF1, NHTL1, PALB2, PDGFRA, PMS2, POLD1, POLE, PTEN, RAD50, RAD51C, RAD51D, RNF43, SDHB, SDHC, SDHD, SMAD4, SMARCA4. STK11, TP53, TSC1, TSC2, and VHL.  The following genes were evaluated for sequence changes only: SDHA and HOXB13 c.251G>A variant only.  (2) status post left modified radical mastectomy 12/19/2019 for a pT2 pN1, stage IIB invasive ductal carcinoma, grade 3, with negative margins.  (a) 1 of 13 axillary lymph nodes removed was positive  (3) adjuvant chemotherapy will consist of cyclophosphamide and doxorubicin in dose dense fashion x4 starting 01/22/2020 followed by weekly paclitaxel x12  (a) baseline echocardiogram 11/26/2019 shows an ejection fraction in the 65-70% range.  (b) first cycle of cyclophosphamide and doxorubicin dose reduced to assess tolerance  (c) chemotherapy discontinued after 2 cycles because of poor tolerance  (4) adjuvant radiation completed 05/21/2020  (5) anastrozole started July 2021   PLAN: Jayden is now a little over half a year out from definitive surgery for her breast cancer.  There is no evidence of active disease.  She is tolerating anastrozole well and the plan will be to continue that a minimum of 5 years.  I am delighted that she is more mobile, and is on a better diet as well.  She is up-to-date on the COVID-19 vaccinations.  She will have mammography in February 2022 and I will see her the following  month  Total encounter time 25 minutes.*  Virgie Dad. Maginat, MD 07/09/20 12:43 PM Medical Oncology and Hematology [facility] [place], [place] [address] Tel. [phone]    Fax. [phone]   I, Wilburn Mylar, am acting as scribe for Dr. Virgie Dad. [place].  I, Lurline Del MD, have reviewed the above documentation for accuracy and completeness, and I agree with the above.   *Total Encounter Time as defined by the Centers for Medicare and Medicaid Services includes, in addition to the face-to-face time of a patient visit (documented in the note above) non-face-to-face time: obtaining and reviewing outside history, ordering and reviewing medications, tests or procedures, care coordination (communications with other health care professionals or caregivers) and documentation in the medical record.

## 2020-07-09 ENCOUNTER — Inpatient Hospital Stay: Payer: Medicaid Other | Attending: Oncology

## 2020-07-09 ENCOUNTER — Other Ambulatory Visit: Payer: Medicaid Other

## 2020-07-09 ENCOUNTER — Inpatient Hospital Stay: Payer: Medicaid Other

## 2020-07-09 ENCOUNTER — Inpatient Hospital Stay (HOSPITAL_BASED_OUTPATIENT_CLINIC_OR_DEPARTMENT_OTHER): Payer: Medicaid Other | Admitting: Oncology

## 2020-07-09 ENCOUNTER — Other Ambulatory Visit: Payer: Self-pay

## 2020-07-09 VITALS — BP 126/67 | HR 63 | Temp 97.7°F | Resp 18 | Ht 61.0 in | Wt 277.6 lb

## 2020-07-09 DIAGNOSIS — M7989 Other specified soft tissue disorders: Secondary | ICD-10-CM | POA: Diagnosis not present

## 2020-07-09 DIAGNOSIS — Z95828 Presence of other vascular implants and grafts: Secondary | ICD-10-CM

## 2020-07-09 DIAGNOSIS — C50412 Malignant neoplasm of upper-outer quadrant of left female breast: Secondary | ICD-10-CM

## 2020-07-09 DIAGNOSIS — Z823 Family history of stroke: Secondary | ICD-10-CM | POA: Insufficient documentation

## 2020-07-09 DIAGNOSIS — Z79811 Long term (current) use of aromatase inhibitors: Secondary | ICD-10-CM | POA: Diagnosis not present

## 2020-07-09 DIAGNOSIS — Z6841 Body Mass Index (BMI) 40.0 and over, adult: Secondary | ICD-10-CM

## 2020-07-09 DIAGNOSIS — Z923 Personal history of irradiation: Secondary | ICD-10-CM | POA: Diagnosis not present

## 2020-07-09 DIAGNOSIS — D696 Thrombocytopenia, unspecified: Secondary | ICD-10-CM

## 2020-07-09 DIAGNOSIS — Z79899 Other long term (current) drug therapy: Secondary | ICD-10-CM | POA: Diagnosis not present

## 2020-07-09 DIAGNOSIS — Z170 Estrogen receptor positive status [ER+]: Secondary | ICD-10-CM | POA: Insufficient documentation

## 2020-07-09 DIAGNOSIS — Z8041 Family history of malignant neoplasm of ovary: Secondary | ICD-10-CM | POA: Diagnosis not present

## 2020-07-09 DIAGNOSIS — Z888 Allergy status to other drugs, medicaments and biological substances status: Secondary | ICD-10-CM | POA: Insufficient documentation

## 2020-07-09 DIAGNOSIS — Z833 Family history of diabetes mellitus: Secondary | ICD-10-CM | POA: Insufficient documentation

## 2020-07-09 DIAGNOSIS — I10 Essential (primary) hypertension: Secondary | ICD-10-CM | POA: Diagnosis not present

## 2020-07-09 DIAGNOSIS — S37009A Unspecified injury of unspecified kidney, initial encounter: Secondary | ICD-10-CM | POA: Insufficient documentation

## 2020-07-09 DIAGNOSIS — C50812 Malignant neoplasm of overlapping sites of left female breast: Secondary | ICD-10-CM | POA: Insufficient documentation

## 2020-07-09 DIAGNOSIS — Z818 Family history of other mental and behavioral disorders: Secondary | ICD-10-CM | POA: Insufficient documentation

## 2020-07-09 LAB — CBC WITH DIFFERENTIAL/PLATELET
Abs Immature Granulocytes: 0.07 10*3/uL (ref 0.00–0.07)
Basophils Absolute: 0 10*3/uL (ref 0.0–0.1)
Basophils Relative: 0 %
Eosinophils Absolute: 0.1 10*3/uL (ref 0.0–0.5)
Eosinophils Relative: 2 %
HCT: 25.4 % — ABNORMAL LOW (ref 36.0–46.0)
Hemoglobin: 8.4 g/dL — ABNORMAL LOW (ref 12.0–15.0)
Immature Granulocytes: 1 %
Lymphocytes Relative: 20 %
Lymphs Abs: 1.4 10*3/uL (ref 0.7–4.0)
MCH: 32.7 pg (ref 26.0–34.0)
MCHC: 33.1 g/dL (ref 30.0–36.0)
MCV: 98.8 fL (ref 80.0–100.0)
Monocytes Absolute: 0.7 10*3/uL (ref 0.1–1.0)
Monocytes Relative: 10 %
Neutro Abs: 4.5 10*3/uL (ref 1.7–7.7)
Neutrophils Relative %: 67 %
Platelets: 151 10*3/uL (ref 150–400)
RBC: 2.57 MIL/uL — ABNORMAL LOW (ref 3.87–5.11)
RDW: 13.3 % (ref 11.5–15.5)
WBC: 6.8 10*3/uL (ref 4.0–10.5)
nRBC: 0 % (ref 0.0–0.2)

## 2020-07-09 LAB — COMPREHENSIVE METABOLIC PANEL
ALT: 21 U/L (ref 0–44)
AST: 19 U/L (ref 15–41)
Albumin: 2.8 g/dL — ABNORMAL LOW (ref 3.5–5.0)
Alkaline Phosphatase: 47 U/L (ref 38–126)
Anion gap: 9 (ref 5–15)
BUN: 31 mg/dL — ABNORMAL HIGH (ref 8–23)
CO2: 24 mmol/L (ref 22–32)
Calcium: 9.2 mg/dL (ref 8.9–10.3)
Chloride: 108 mmol/L (ref 98–111)
Creatinine, Ser: 1.75 mg/dL — ABNORMAL HIGH (ref 0.44–1.00)
GFR, Estimated: 32 mL/min — ABNORMAL LOW (ref >60)
Glucose, Bld: 104 mg/dL — ABNORMAL HIGH (ref 70–99)
Potassium: 3.8 mmol/L (ref 3.5–5.1)
Sodium: 141 mmol/L (ref 135–145)
Total Bilirubin: 0.4 mg/dL (ref 0.3–1.2)
Total Protein: 6.8 g/dL (ref 6.5–8.1)

## 2020-07-09 MED ORDER — SODIUM CHLORIDE 0.9% FLUSH
10.0000 mL | Freq: Once | INTRAVENOUS | Status: AC
  Administered 2020-07-09: 10 mL via INTRAVENOUS
  Filled 2020-07-09: qty 10

## 2020-07-09 MED ORDER — HEPARIN SOD (PORK) LOCK FLUSH 100 UNIT/ML IV SOLN
500.0000 [IU] | Freq: Once | INTRAVENOUS | Status: AC
  Administered 2020-07-09: 500 [IU] via INTRAVENOUS
  Filled 2020-07-09: qty 5

## 2020-07-09 NOTE — Patient Instructions (Signed)

## 2020-07-14 ENCOUNTER — Other Ambulatory Visit: Payer: Self-pay

## 2020-07-14 ENCOUNTER — Other Ambulatory Visit: Payer: Self-pay | Admitting: *Deleted

## 2020-07-14 MED ORDER — GABAPENTIN 300 MG PO CAPS: 300.0000 mg | ORAL_CAPSULE | Freq: Every day | ORAL | 6 refills | Status: DC

## 2020-07-14 MED ORDER — GABAPENTIN 100 MG PO CAPS
100.0000 mg | ORAL_CAPSULE | Freq: Two times a day (BID) | ORAL | 1 refills | Status: DC
Start: 2020-07-14 — End: 2020-11-17

## 2020-07-14 NOTE — Telephone Encounter (Signed)
This LPN spoke with pt to inform her that her Gabapentin 300 mg has been called in and she should take it at night. Pt verbalizes thanks and understanding.

## 2020-07-15 ENCOUNTER — Telehealth: Payer: Self-pay | Admitting: Oncology

## 2020-07-15 NOTE — Telephone Encounter (Signed)
Scheduled 11/10 los. Called and spoke with pt, confirmed 3/15 appt

## 2020-07-16 ENCOUNTER — Other Ambulatory Visit: Payer: Self-pay

## 2020-07-16 NOTE — Patient Outreach (Signed)
Care Coordination - Case Manager  07/16/2020  [name] April 25, 1956 [id number]  Subjective:  [name] is an 64 y.o. year old female who is a primary [name] of Fulp, Ander Gaster, MD.  [name] was given information about Medicaid Managed Care team care coordination services today. [name] agreed to services and verbal consent obtained  Review of [name] status, laboratory and other test data was performed as part of evaluation for provision of services.  SDOH: SDOH Screenings   Alcohol Screen:   . Last Alcohol Screening Score (AUDIT): Not on file  Depression (PHQ2-9): Low Risk   . PHQ-2 Score: 2  Financial Resource Strain: Low Risk   . Difficulty of Paying Living Expenses: Not hard at all  Food Insecurity: No Food Insecurity  . Worried About Charity fundraiser in the Last Year: Never true  . Ran Out of Food in the Last Year: Never true  Housing:   . Last Housing Risk Score: Not on file  Physical Activity:   . Days of Exercise per Week: Not on file  . Minutes of Exercise per Session: Not on file  Social Connections:   . Frequency of Communication with Friends and Family: Not on file  . Frequency of Social Gatherings with Friends and Family: Not on file  . Attends Religious Services: Not on file  . Active Member of Clubs or Organizations: Not on file  . Attends Archivist Meetings: Not on file  . Marital Status: Not on file  Stress:   . Feeling of Stress : Not on file  Tobacco Use: Low Risk   . Smoking Tobacco Use: Never Smoker  . Smokeless Tobacco Use: Never Used  Transportation Needs: No Transportation Needs  . Lack of Transportation (Medical): No  . Lack of Transportation (Non-Medical): No     Objective:    Allergies  Allergen Reactions  . Amlodipine Swelling    BLE edema    Medications:    Medications Reviewed Today    Reviewed by Gayla Medicus, RN (Registered Nurse) on 07/16/20 at 53  Med List Status: <None>  Medication  Order Taking? Sig Documenting Provider Last Dose Status Informant  anastrozole (ARIMIDEX) 1 MG tablet [id number] Yes Take 1 tablet (1 mg total) by mouth daily. Magrinat, Virgie Dad, MD Taking Active Self  aspirin EC 81 MG EC tablet [id number] Yes Take 1 tablet (81 mg total) by mouth daily. Swallow whole. [name], MD Taking Active Self  Blood Pressure Monitor DEVI [id number] Yes Use as instructed by provider to check bloos pressure daily. ICD: I10 Fulp, Ander Gaster, MD Taking Active Self  dexamethasone (DECADRON) 4 MG tablet [id number] No Take 4 mg by mouth daily.   [name] not taking: Reported on 07/16/2020   [provider] Not Taking Active Self  feeding supplement (ENSURE ENLIVE / ENSURE PLUS) LIQD [id number] Yes Take 237 mLs by mouth 2 (two) times daily between meals. [name], MD Taking Active   folic acid (FOLVITE) 1 MG tablet [id number] Yes Take 1 tablet (1 mg total) by mouth daily. Eugenie Filler, MD Taking Active Self  furosemide (LASIX) 20 MG tablet [id number] No Take 1 tablet (20 mg total) by mouth daily.  [name] not taking: Reported on 07/16/2020   [name], MD Not Taking Active   gabapentin (NEURONTIN) 100 MG capsule [id number] Yes Take 1 capsule (100 mg total) by mouth 2 (two) times daily. Magrinat, Virgie Dad, MD Taking Active   gabapentin (NEURONTIN) 300 MG  capsule [id number] Yes Take 1 capsule (300 mg total) by mouth at bedtime. Magrinat, Virgie Dad, MD Taking Active   lidocaine-prilocaine (EMLA) cream [id number] Yes Apply 1 application topically as needed.  [name] taking differently: Apply 1 application topically as needed (port).    Magrinat, Virgie Dad, MD Taking Active Self  Magnesium Oxide 400 MG CAPS [id number] Yes Take 1 capsule (400 mg total) by mouth daily. [name], MD Taking Active   Multiple Vitamin (MULTIVITAMIN WITH MINERALS) TABS tablet [id number] Yes Take 1 tablet by mouth daily. [name], MD Taking Active    Nutritional Supplements (FEEDING SUPPLEMENT, KATE FARMS STANDARD 1.4,) LIQD liquid [id number] No Take 325 mLs by mouth 2 (two) times daily between meals.  [name] not taking: Reported on 07/16/2020   [name], MD Not Taking Active   nystatin (MYCOSTATIN/NYSTOP) powder [id number] Yes Apply topically 3 (three) times daily. Apply to under panus. [name], MD Taking Active   predniSONE (DELTASONE) 20 MG tablet [id number] No 2 pills once daily x5 days.  Take after eating  [name] not taking: Reported on 07/03/2020   [name], MD Not Taking Active   thiamine 100 MG tablet [id number] Yes Take 1 tablet (100 mg total) by mouth daily. [name], MD Taking Active           Assessment:   Goals Addressed              This Visit's Progress   .  COMPLETED: "I'm having trouble having BM" (pt-stated)        CARE PLAN ENTRY Medicaid Managed Care (see longitudinal plan of care for additional care plan information)  Current Barriers:  [name] experiencing trouble having bowel movement since returning home from hospital.  Nurse Case Manager Clinical Goal(s):  Marland Kitchen Over the next 30 days, [name] will verbalize understanding of plan for improved bowel function.Update: [name] states she is no longer having difficulty. . Over the next 30 days, [name] will work with providers  to address needs related to improved bowel function. . Over the next 30 days, [name] will not experience hospital admission. Hospital Admissions in last 6 months = 6 . Over the next 30 days, [name] will attend all scheduled medical appointments:  . Over the next 30 days, [name] will experience decrease in ED visits. ED visits in last 6 months = 5. . Over the next 30 days, [name] will work with CM team pharmacist to address options for improved bowel function.  Interventions:  . Inter-disciplinary care team collaboration (see longitudinal plan of care) . Advised [name] to follow up with providers. Nash Dimmer with pharmacy regarding options for improved bowel function. . Discussed plans with [name] for ongoing care management follow up and provided [name] with direct contact information for care management team . Pharmacy referral for options to improve bowel function.  Plan:  . [name] will follow up with providers regarding improved bowel function. Marland Kitchen RNCM will follow up with [name] within 7 days.   Please see past updates related to this goal by clicking on the "Past Updates" button in the selected goal     .  COMPLETED: "I'm having trouble sleeping" (pt-stated)        [place] (see longitudinal plan of care for additional care plan information)  Current Barriers:  Marland Kitchen Knowledge Deficits related to sleep deficit . Care Coordination needs related to sleep deficit  Nurse Case Manager Clinical Goal(s):  Over the next 30  days, [name] will verbalize understanding of plan for increased sleep. . Over the next 30 days, [name] will work with provider team to address needs related to sleep.  Update:  [name] states she is sleeping better. . Over the next 30 days, [name] will not experience hospital admission. Hospital Admissions in last 6 months = 6. . Over the next 30 days, [name] will attend all scheduled medical appointments:  . Over the next 30 days, [name] will experience decrease in ED visits. ED visits in last 6 months = 5 . Over the next 30days, [name] will work with CM team pharmacist to discuss any recommendations for sleep.  Interventions:  . Inter-disciplinary care team collaboration (see longitudinal plan of care) . Advised [name] to work with providers for options related to improved sleep. . Provided education to [name] re: improved sleep. Nash Dimmer with pharmacy regarding options for improved sleep. . Discussed plans with [name] for ongoing care management follow up and provided [name] with direct contact information for  care management team . Pharmacy referral for improved sleep options.  Plan:  . [name] will work with provider teams for improvement. Marland Kitchen RNCM will follow up with [name] within 7 days   Please see past updates related to this goal by clicking on the "Past Updates" button in the selected goal     .  "My foot is tender but feeling better and swelling has decreased." (pt-stated)        [place] (see longitudinal plan of care for additional care plan information)  Current Barriers:  . [name] states she has gout and is taking prescribed medication with improvement.  She states she continues PT with [facility] once a week and is doing home exercises twice a week.  She is walking with a walker in her apartment and fixing her meals.   [name] is trying to obtain a 2 bedroom apartment on the first floor.  Nurse Case Manager Clinical Goal(s):  Marland Kitchen Over the next 30 days, [name] will verbalize understanding of plan for improved mobility with decreased pain.   Update:  [name] states she is having no pain now.  Interventions:  . Inter-disciplinary care team collaboration (see longitudinal plan of care) . Advised [name] to continue plan per provider. . Discussed plans with [name] for ongoing care management follow up and provided [name] with direct contact information for care management team . Care Guide referral for housing-[name] trying to obtain a 2 bedroom apartment on first floor.  She is also working with [employer] and Wellness case worker to obtain necessary paperwork.   Update:  Paperwork is complete.  [name] picked up paperwork from [place] and has submitted.  Plan:  . [name] will have continued improvement in tenderness and mobility. Marland Kitchen RNCM will follow up with [name] within 14 days.  Please see past updates related to this goal by clicking on the "Past Updates" button in the selected goal        Plan: RNCM will follow up  with [name] within 14 days.

## 2020-07-16 NOTE — Patient Instructions (Signed)
Hi [name], thank you for speaking with me today.  [name] was given information about Medicaid Managed Care team care coordination services as a part of their [facility] Medicaid benefit. [name] verbally consented to engagement with the [facility] Managed Care team.   For questions related to your [facility], please call: [phone] or visit the homepage here: [email or web address]  If you would like to schedule transportation through your [facility], please call the following number at least 2 days in advance of your appointment: [phone]  Goals Addressed              This Visit's Progress   .  COMPLETED: "I'm having trouble having BM" (pt-stated)        CARE PLAN ENTRY Medicaid Managed Care (see longitudinal plan of care for additional care plan information)  Current Barriers:  Patient experiencing trouble having bowel movement since returning home from hospital.  Nurse Case Manager Clinical Goal(s):  Marland Kitchen Over the next 30 days, patient will verbalize understanding of plan for improved bowel function.Update: patient states she is no longer having difficulty. . Over the next 30 days, patient will work with providers  to address needs related to improved bowel function. . Over the next 30 days, patient will not experience hospital admission. Hospital Admissions in last 6 months = 6 . Over the next 30 days, patient will attend all scheduled medical appointments:  . Over the next 30 days, patient will experience decrease in ED visits. ED visits in last 6 months = 5. . Over the next 30 days, patient will work with CM team pharmacist to address options for improved bowel function.  Interventions:  . Inter-disciplinary care team collaboration (see longitudinal plan of care) . Advised patient to follow up with providers. Nash Dimmer with pharmacy regarding  options for improved bowel function. . Discussed plans with patient for ongoing care management follow up and provided patient with direct contact information for care management team . Pharmacy referral for options to improve bowel function.  Plan:  . Patient will follow up with providers regarding improved bowel function. Marland Kitchen RNCM will follow up with patient within 7 days.   Please see past updates related to this goal by clicking on the "Past Updates" button in the selected goal     .  COMPLETED: "I'm having trouble sleeping" (pt-stated)        [place] (see longitudinal plan of care for additional care plan information)  Current Barriers:  Marland Kitchen Knowledge Deficits related to sleep deficit . Care Coordination needs related to sleep deficit  Nurse Case Manager Clinical Goal(s):  Over the next 30 days, patient will verbalize understanding of plan for increased sleep. . Over the next 30 days, patient will work with provider team to address needs related to sleep.  Update:  patient states she is sleeping better. . Over the next 30 days, patient will not experience hospital admission. Hospital Admissions in last 6 months = 6. . Over the next 30 days, patient will attend all scheduled medical appointments:  . Over the next 30 days, patient will experience decrease in ED visits. ED visits in last 6 months = 5 . Over the next 30days, patient will work with CM team pharmacist to discuss any recommendations for sleep.  Interventions:  . Inter-disciplinary care team collaboration (see longitudinal plan of care) . Advised patient to work with providers for options related to improved  sleep. . Provided education to patient re: improved sleep. Nash Dimmer with pharmacy regarding options for improved sleep. . Discussed plans with patient for ongoing care management follow up and provided patient with direct contact information for care management team . Pharmacy  referral for improved sleep options.  Plan:  . Patient will work with provider teams for improvement. Marland Kitchen RNCM will follow up with patient within 7 days   Please see past updates related to this goal by clicking on the "Past Updates" button in the selected goal     .  "My foot is tender but feeling better and swelling has decreased." (pt-stated)        [place] (see longitudinal plan of care for additional care plan information)  Current Barriers:  . Patient states she has gout and is taking prescribed medication with improvement.  She states she continues PT with [facility] once a week and is doing home exercises twice a week.  She is walking with a walker in her apartment and fixing her meals.   Patient is trying to obtain a 2 bedroom apartment on the first floor.  Nurse Case Manager Clinical Goal(s):  Marland Kitchen Over the next 30 days, patient will verbalize understanding of plan for improved mobility with decreased pain.   Update:  Patient states she is having no pain now.  Interventions:  . Inter-disciplinary care team collaboration (see longitudinal plan of care) . Advised patient to continue plan per provider. . Discussed plans with patient for ongoing care management follow up and provided patient with direct contact information for care management team . Care Guide referral for housing-patient trying to obtain a 2 bedroom apartment on first floor.  She is also working with [employer] and Wellness case worker to obtain necessary paperwork.   Update:  Paperwork is complete.  Patient picked up paperwork from [place] and has submitted.  Plan:  . Patient will have continued improvement in tenderness and mobility. Marland Kitchen RNCM will follow up with patient within 14 days.  Please see past updates related to this goal by clicking on the "Past Updates" button in the selected goal       Patient verbalizes understanding of instructions provided  today.   The Managed Medicaid care management team will reach out to the patient again over the next 14 days.  The patient has been provided with contact information for the Managed Medicaid care management team and has been advised to call with any health related questions or concerns.   Sue Raider RN, BSN [place]  [facility] Curator - Managed Medicaid High Risk [phone].

## 2020-07-30 ENCOUNTER — Other Ambulatory Visit: Payer: Self-pay | Admitting: Obstetrics and Gynecology

## 2020-07-30 ENCOUNTER — Other Ambulatory Visit: Payer: Self-pay

## 2020-07-30 DIAGNOSIS — C50412 Malignant neoplasm of upper-outer quadrant of left female breast: Secondary | ICD-10-CM | POA: Diagnosis not present

## 2020-07-30 NOTE — Patient Instructions (Signed)
Hi [name] you for speaking with me today-I am glad you are doing well.  [name] was given information about Medicaid Managed Care team care coordination services as a part of their [place] Medicaid benefit. [name] verbally consented to engagement with the [facility] Managed Care team.   For questions related to your [facility], please call: [phone] or visit the homepage here: [email or web address]  If you would like to schedule transportation through your [facility], please call the following number at least 2 days in advance of your appointment: [phone]  Goals Addressed              This Visit's Progress   .  "My foot is tender but feeling better and swelling has decreased." (pt-stated)        CARE PLAN ENTRY Medicaid Managed Care (see longitudinal plan of care for additional care plan information)  Current Barriers:  . Patient states she has gout and is taking prescribed medication with improvement.  She states she continues PT with [facility] once a week and is doing home exercises twice a week.  She is walking with a walker in her apartment and fixing her meals.   Patient is trying to obtain a 2 bedroom apartment on the first floor.  Nurse Case Manager Clinical Goal(s):  Marland Kitchen Over the next 30 days, patient will verbalize understanding of plan for improved mobility with decreased pain.   Update:  Patient states she is having no pain now.        07/30/20-Patient continues to ambulate well with use of walker and cane.  Interventions:  . Inter-disciplinary care team collaboration (see longitudinal plan of care) . Advised patient to continue plan per provider. . Discussed plans with patient for ongoing care management follow up and provided patient with direct contact information for care management team . Care Guide referral for housing-patient  trying to obtain a 2 bedroom apartment on first floor.  She is also working with [employer] and Wellness case worker to obtain necessary paperwork.   Update:  Paperwork is complete.  Patient picked up paperwork from [place] and has submitted. . 07/30/20-Patient awaiting to hear from housing authority-told it could be first of year.  Will continue to follow along with patient.  Plan:  . Patient will have continued improvement in tenderness and mobility. Marland Kitchen RNCM will follow up with patient within 30 days.  Please see past updates related to this goal by clicking on the "Past Updates" button in the selected goal       Patient verbalizes understanding of instructions provided today.   RN Care Manager will follow up with patient within 30 days.  Aida Raider RN, BSN [place]  [facility] Curator - Managed Medicaid High Risk [phone].

## 2020-07-30 NOTE — Patient Outreach (Cosign Needed)
Care Coordination - Case Manager  07/30/2020  [name] 02-Jun-1956 [id number]  Subjective:  [name] is an 64 y.o. year old female who is a primary patient of Fulp, Ander Gaster, [name].  [name] was given information about Medicaid Managed Care team care coordination services today. [name] agreed to services and verbal consent obtained  Review of patient status, laboratory and other test data was performed as part of evaluation for provision of services.  SDOH: SDOH Screenings   Alcohol Screen:   . Last Alcohol Screening Score (AUDIT): Not on file  Depression (PHQ2-9): Low Risk   . PHQ-2 Score: 2  Financial Resource Strain: Low Risk   . Difficulty of Paying Living Expenses: Not hard at all  Food Insecurity: No Food Insecurity  . Worried About Charity fundraiser in the Last Year: Never true  . Ran Out of Food in the Last Year: Never true  Housing:   . Last Housing Risk Score: Not on file  Physical Activity:   . Days of Exercise per Week: Not on file  . Minutes of Exercise per Session: Not on file  Social Connections:   . Frequency of Communication with Friends and Family: Not on file  . Frequency of Social Gatherings with Friends and Family: Not on file  . Attends Religious Services: Not on file  . Active Member of Clubs or Organizations: Not on file  . Attends Archivist Meetings: Not on file  . Marital Status: Not on file  Stress:   . Feeling of Stress : Not on file  Tobacco Use: Low Risk   . Smoking Tobacco Use: Never Smoker  . Smokeless Tobacco Use: Never Used  Transportation Needs: No Transportation Needs  . Lack of Transportation (Medical): No  . Lack of Transportation (Non-Medical): No     Objective:    Allergies  Allergen Reactions  . Amlodipine Swelling    BLE edema    Medications:    Medications Reviewed Today    Reviewed by Sue Medicus, RN (Registered Nurse) on 07/30/20 at [phone]  Med List Status: <None>  Medication Order  Taking? Sig Documenting Provider Last Dose Status Informant  anastrozole (ARIMIDEX) 1 MG tablet [id number] Yes Take 1 tablet (1 mg total) by mouth daily. [name] Taking Active Self  aspirin EC 81 MG EC tablet [id number] Yes Take 1 tablet (81 mg total) by mouth daily. Swallow whole. [name], [name] Taking Active Self  Blood Pressure Monitor DEVI [id number] No Use as instructed by provider to check bloos pressure daily. ICD: I10  Patient not taking: Reported on 07/30/2020   Sue Blackbird, [name] Not Taking Active Self  dexamethasone (DECADRON) 4 MG tablet [id number] No Take 4 mg by mouth daily.   Patient not taking: Reported on 07/16/2020   Provider, Historical, [name] Not Taking Active Self  feeding supplement (ENSURE ENLIVE / ENSURE PLUS) LIQD [id number] Yes Take 237 mLs by mouth 2 (two) times daily between meals. Sue Jansky, [name] Taking Active   folic acid (FOLVITE) 1 MG tablet [id number] Yes Take 1 tablet (1 mg total) by mouth daily. Sue Filler, [name] Taking Active Self  furosemide (LASIX) 20 MG tablet [id number] No Take 1 tablet (20 mg total) by mouth daily.  Patient not taking: Reported on 07/16/2020   Sue Lean, [name] Not Taking Active   gabapentin (NEURONTIN) 100 MG capsule [id number] Yes Take 1 capsule (100 mg total) by mouth 2 (two) times daily. Magrinat, Sarajane Jews  [name], [name] Taking Active   gabapentin (NEURONTIN) 300 MG capsule [id number] Yes Take 1 capsule (300 mg total) by mouth at bedtime. [name] Taking Active   lidocaine-prilocaine (EMLA) cream [id number] Yes Apply 1 application topically as needed.  Patient taking differently: Apply 1 application topically as needed (port).    [name] Taking Active Self  Magnesium Oxide 400 MG CAPS [id number] Yes Take 1 capsule (400 mg total) by mouth daily. Sue Lean, [name] Taking Active   Multiple Vitamin (MULTIVITAMIN WITH MINERALS) TABS tablet [id number] Yes Take 1 tablet by mouth daily.  Sue Jansky, [name] Taking Active   Nutritional Supplements (FEEDING SUPPLEMENT, KATE FARMS STANDARD 1.4,) LIQD liquid [id number] No Take 325 mLs by mouth 2 (two) times daily between meals.  Patient not taking: Reported on 07/16/2020   Sue Jansky, [name] Not Taking Active   nystatin (MYCOSTATIN/NYSTOP) powder [id number] Yes Apply topically 3 (three) times daily. Apply to under panus. Sue Jansky, [name] Taking Active   predniSONE (DELTASONE) 20 MG tablet [id number] No 2 pills once daily x5 days.  Take after eating  Patient not taking: Reported on 07/03/2020   Sue Blackbird, [name] Not Taking Active   thiamine 100 MG tablet [id number] No Take 1 tablet (100 mg total) by mouth daily.  Patient not taking: Reported on 07/30/2020   Sue Jansky, [name] Not Taking Active           Assessment:   Goals Addressed              This Visit's Progress   .  "My foot is tender but feeling better and swelling has decreased." (pt-stated)        CARE PLAN ENTRY Medicaid Managed Care (see longitudinal plan of care for additional care plan information)  Current Barriers:  . Patient states she has gout and is taking prescribed medication with improvement.  She states she continues PT with [facility] once a week and is doing home exercises twice a week.  She is walking with a walker in her apartment and fixing her meals.   Patient is trying to obtain a 2 bedroom apartment on the first floor.  Nurse Case Manager Clinical Goal(s):  Marland Kitchen Over the next 30 days, patient will verbalize understanding of plan for improved mobility with decreased pain.   Update:  Patient states she is having no pain now.        07/30/20-Patient continues to ambulate well with use of walker and cane.  Interventions:  . Inter-disciplinary care team collaboration (see longitudinal plan of care) . Advised patient to continue plan per provider. . Discussed plans with patient for ongoing care management follow up and provided patient with  direct contact information for care management team . Care Guide referral for housing-patient trying to obtain a 2 bedroom apartment on first floor.  She is also working with [employer] [facility]ness case worker to obtain necessary paperwork.   Update:  Paperwork is complete.  Patient picked up paperwork from [place] and has submitted. . 07/30/20-Patient awaiting to hear from housing authority-told it could be first of year.  Will continue to follow along with patient.  Plan:  . Patient will have continued improvement in tenderness and mobility. Marland Kitchen RNCM will follow up with patient within 30 days.  Please see past updates related to this goal by clicking on the "Past Updates" button in the selected goal        Plan:  RNCM will follow up with patient within 30 days.  Patient without complaint today-states she is doing well.

## 2020-07-31 ENCOUNTER — Emergency Department (HOSPITAL_COMMUNITY): Payer: Medicare Other

## 2020-07-31 ENCOUNTER — Inpatient Hospital Stay (HOSPITAL_COMMUNITY)
Admission: EM | Admit: 2020-07-31 | Discharge: 2020-08-03 | DRG: 177 | Disposition: A | Discharge status: Home / Self Care | Payer: Medicare Other | Attending: Internal Medicine | Admitting: Internal Medicine

## 2020-07-31 ENCOUNTER — Encounter (HOSPITAL_COMMUNITY): Payer: Self-pay | Admitting: Emergency Medicine

## 2020-07-31 DIAGNOSIS — R0989 Other specified symptoms and signs involving the circulatory and respiratory systems: Secondary | ICD-10-CM | POA: Diagnosis not present

## 2020-07-31 DIAGNOSIS — Z833 Family history of diabetes mellitus: Secondary | ICD-10-CM

## 2020-07-31 DIAGNOSIS — Z8041 Family history of malignant neoplasm of ovary: Secondary | ICD-10-CM

## 2020-07-31 DIAGNOSIS — G9341 Metabolic encephalopathy: Secondary | ICD-10-CM | POA: Diagnosis not present

## 2020-07-31 DIAGNOSIS — Z79899 Other long term (current) drug therapy: Secondary | ICD-10-CM | POA: Diagnosis not present

## 2020-07-31 DIAGNOSIS — E876 Hypokalemia: Secondary | ICD-10-CM | POA: Diagnosis not present

## 2020-07-31 DIAGNOSIS — Z853 Personal history of malignant neoplasm of breast: Secondary | ICD-10-CM

## 2020-07-31 DIAGNOSIS — I10 Essential (primary) hypertension: Secondary | ICD-10-CM | POA: Diagnosis not present

## 2020-07-31 DIAGNOSIS — Z170 Estrogen receptor positive status [ER+]: Secondary | ICD-10-CM | POA: Diagnosis not present

## 2020-07-31 DIAGNOSIS — D631 Anemia in chronic kidney disease: Secondary | ICD-10-CM | POA: Diagnosis present

## 2020-07-31 DIAGNOSIS — Z923 Personal history of irradiation: Secondary | ICD-10-CM | POA: Diagnosis not present

## 2020-07-31 DIAGNOSIS — F1020 Alcohol dependence, uncomplicated: Secondary | ICD-10-CM | POA: Diagnosis not present

## 2020-07-31 DIAGNOSIS — Z6841 Body Mass Index (BMI) 40.0 and over, adult: Secondary | ICD-10-CM

## 2020-07-31 DIAGNOSIS — J1282 Pneumonia due to coronavirus disease 2019: Secondary | ICD-10-CM | POA: Diagnosis not present

## 2020-07-31 DIAGNOSIS — G9340 Encephalopathy, unspecified: Secondary | ICD-10-CM

## 2020-07-31 DIAGNOSIS — R0602 Shortness of breath: Secondary | ICD-10-CM | POA: Diagnosis not present

## 2020-07-31 DIAGNOSIS — R4701 Aphasia: Secondary | ICD-10-CM | POA: Diagnosis present

## 2020-07-31 DIAGNOSIS — Z823 Family history of stroke: Secondary | ICD-10-CM

## 2020-07-31 DIAGNOSIS — C50412 Malignant neoplasm of upper-outer quadrant of left female breast: Secondary | ICD-10-CM | POA: Diagnosis not present

## 2020-07-31 DIAGNOSIS — I130 Hypertensive heart and chronic kidney disease with heart failure and stage 1 through stage 4 chronic kidney disease, or unspecified chronic kidney disease: Secondary | ICD-10-CM | POA: Diagnosis present

## 2020-07-31 DIAGNOSIS — N1831 Chronic kidney disease, stage 3a: Secondary | ICD-10-CM | POA: Diagnosis not present

## 2020-07-31 DIAGNOSIS — D638 Anemia in other chronic diseases classified elsewhere: Secondary | ICD-10-CM | POA: Diagnosis present

## 2020-07-31 DIAGNOSIS — N1832 Chronic kidney disease, stage 3b: Secondary | ICD-10-CM | POA: Diagnosis not present

## 2020-07-31 DIAGNOSIS — I5032 Chronic diastolic (congestive) heart failure: Secondary | ICD-10-CM | POA: Diagnosis not present

## 2020-07-31 DIAGNOSIS — Z888 Allergy status to other drugs, medicaments and biological substances status: Secondary | ICD-10-CM | POA: Diagnosis not present

## 2020-07-31 DIAGNOSIS — G9349 Other encephalopathy: Secondary | ICD-10-CM | POA: Diagnosis not present

## 2020-07-31 DIAGNOSIS — R531 Weakness: Secondary | ICD-10-CM | POA: Diagnosis not present

## 2020-07-31 DIAGNOSIS — E639 Nutritional deficiency, unspecified: Secondary | ICD-10-CM | POA: Diagnosis present

## 2020-07-31 DIAGNOSIS — R0902 Hypoxemia: Secondary | ICD-10-CM | POA: Diagnosis present

## 2020-07-31 DIAGNOSIS — Z7982 Long term (current) use of aspirin: Secondary | ICD-10-CM | POA: Diagnosis not present

## 2020-07-31 DIAGNOSIS — U071 COVID-19: Secondary | ICD-10-CM | POA: Diagnosis not present

## 2020-07-31 DIAGNOSIS — R5381 Other malaise: Secondary | ICD-10-CM | POA: Diagnosis not present

## 2020-07-31 DIAGNOSIS — Z8249 Family history of ischemic heart disease and other diseases of the circulatory system: Secondary | ICD-10-CM | POA: Diagnosis not present

## 2020-07-31 DIAGNOSIS — Z23 Encounter for immunization: Secondary | ICD-10-CM

## 2020-07-31 LAB — CBC WITH DIFFERENTIAL/PLATELET
Abs Immature Granulocytes: 0.03 10*3/uL (ref 0.00–0.07)
Basophils Absolute: 0 10*3/uL (ref 0.0–0.1)
Basophils Relative: 0 %
Eosinophils Absolute: 0.1 10*3/uL (ref 0.0–0.5)
Eosinophils Relative: 2 %
HCT: 26.7 % — ABNORMAL LOW (ref 36.0–46.0)
Hemoglobin: 8.8 g/dL — ABNORMAL LOW (ref 12.0–15.0)
Immature Granulocytes: 1 %
Lymphocytes Relative: 18 %
Lymphs Abs: 0.9 10*3/uL (ref 0.7–4.0)
MCH: 31.4 pg (ref 26.0–34.0)
MCHC: 33 g/dL (ref 30.0–36.0)
MCV: 95.4 fL (ref 80.0–100.0)
Monocytes Absolute: 0.4 10*3/uL (ref 0.1–1.0)
Monocytes Relative: 9 %
Neutro Abs: 3.4 10*3/uL (ref 1.7–7.7)
Neutrophils Relative %: 70 %
Platelets: 155 10*3/uL (ref 150–400)
RBC: 2.8 MIL/uL — ABNORMAL LOW (ref 3.87–5.11)
RDW: 13.2 % (ref 11.5–15.5)
WBC: 4.8 10*3/uL (ref 4.0–10.5)
nRBC: 0 % (ref 0.0–0.2)

## 2020-07-31 LAB — FIBRINOGEN: Fibrinogen: 449 mg/dL (ref 210–475)

## 2020-07-31 LAB — D-DIMER, QUANTITATIVE: D-Dimer, Quant: 2.85 ug/mL-FEU — ABNORMAL HIGH (ref 0.00–0.50)

## 2020-07-31 IMAGING — CT CT HEAD W/O CM
3 series · 16 of 47 positions shown, 19 images · non-contrast
Comparison: None.

CLINICAL DATA: Malaise, body aches and shortness of breath.

EXAM:
CT HEAD WITHOUT CONTRAST
TECHNIQUE: Contiguous axial images were obtained from the base of the skull
through the vertex without intravenous contrast.

[Series 3: head wo · axial · 0.47mm/px · z∈[+1387,+1517]mm · 10 of 32 slices shown, 13 images]
[im 3/32  brain]
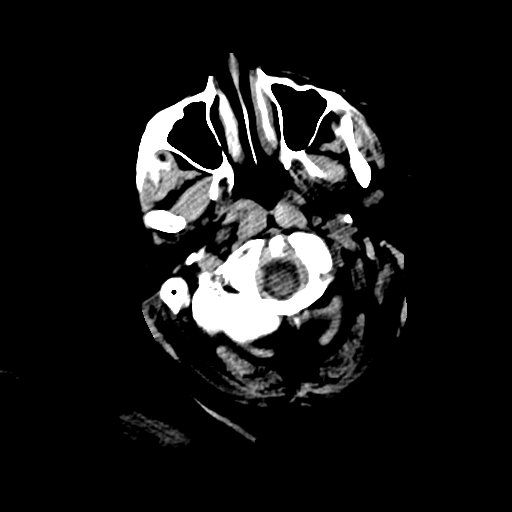
[im 3/32  bone]
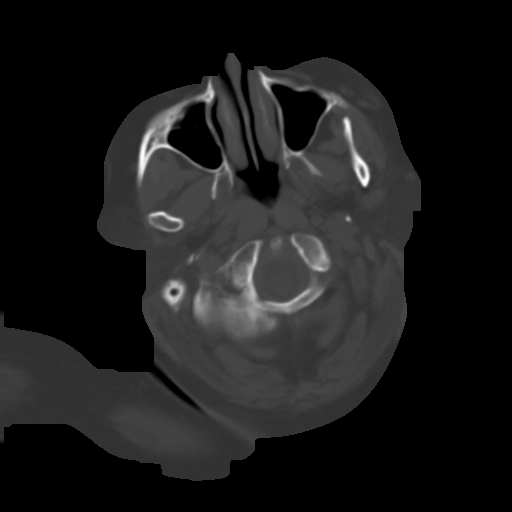
[im 6/32  brain]
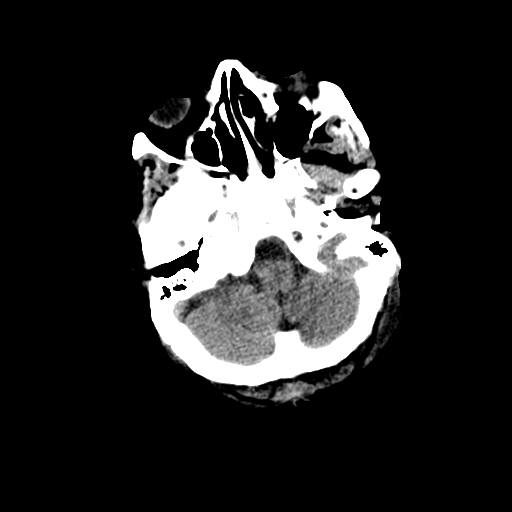
[im 9/32  brain]
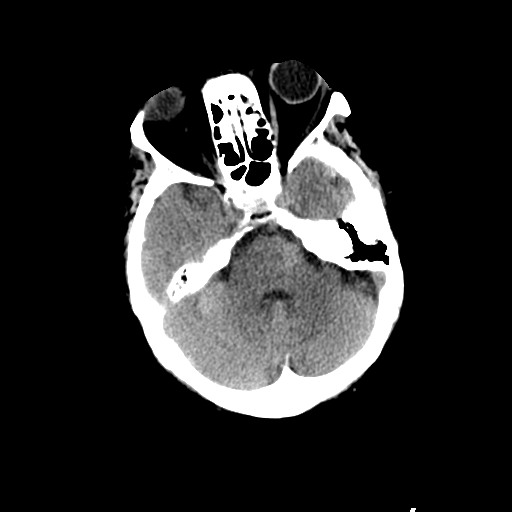
[im 11/32  brain]
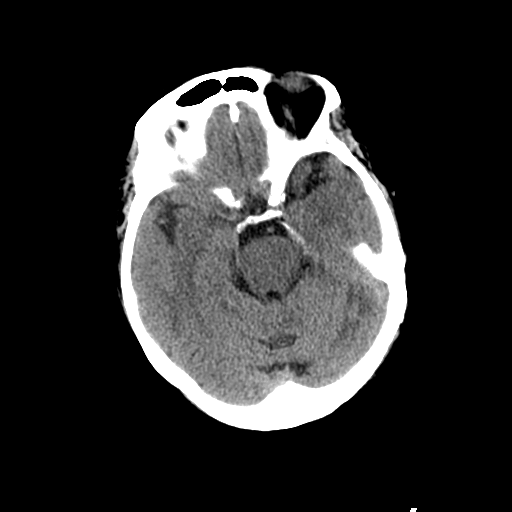
[im 14/32  brain]
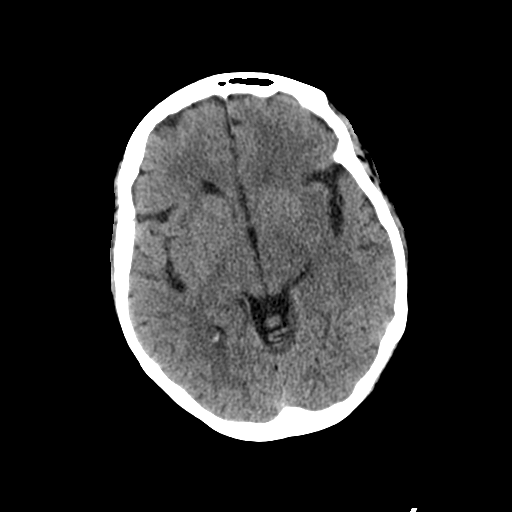
[im 14/32  bone]
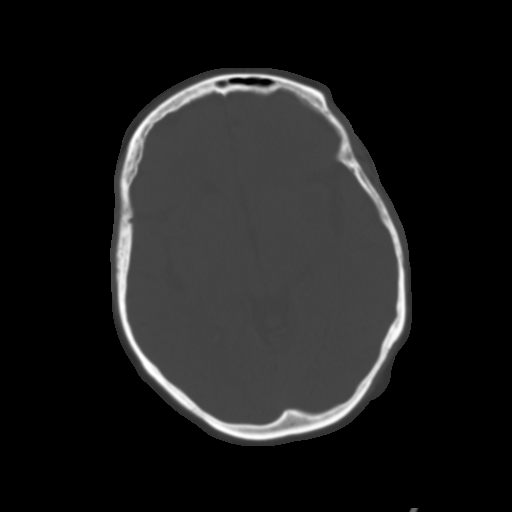
[im 18/32  brain]
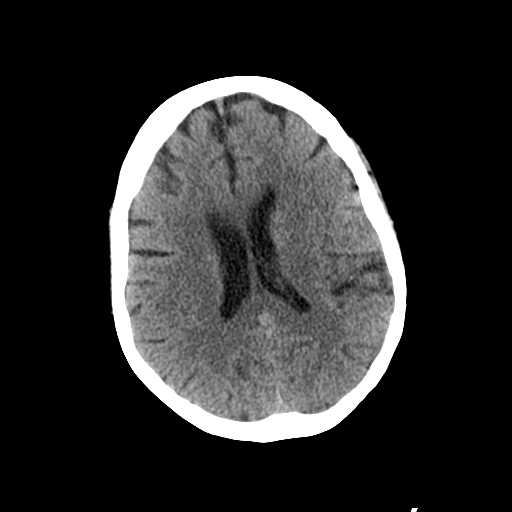
[im 21/32  brain]
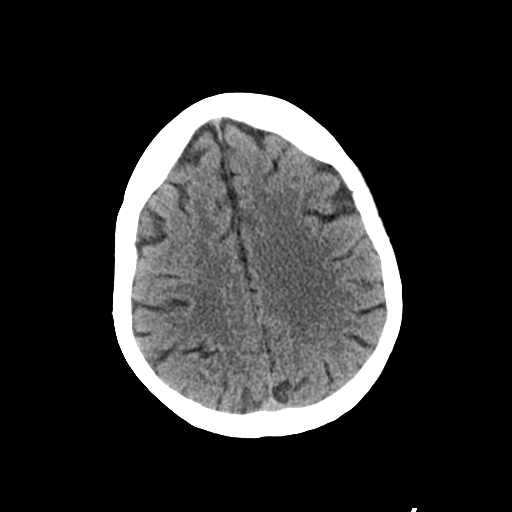
[im 24/32  brain]
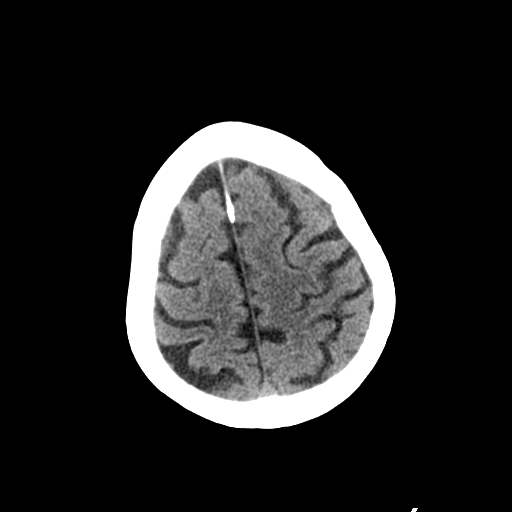
[im 26/32  brain]
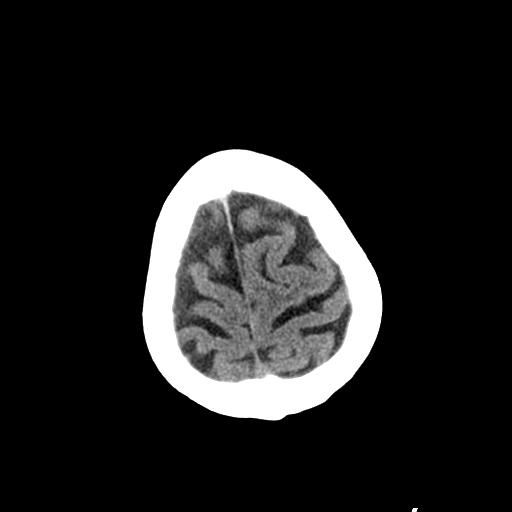
[im 26/32  bone]
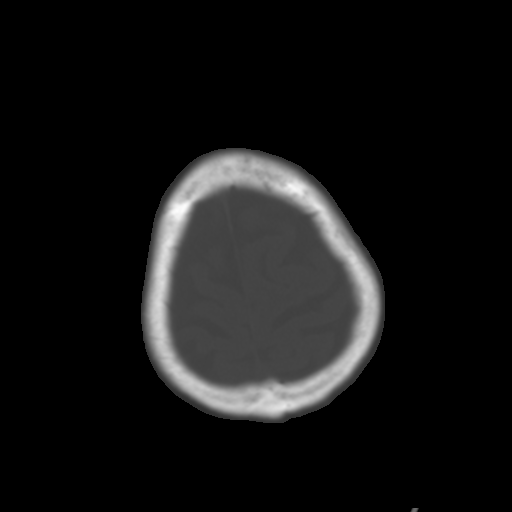
[im 29/32  brain]
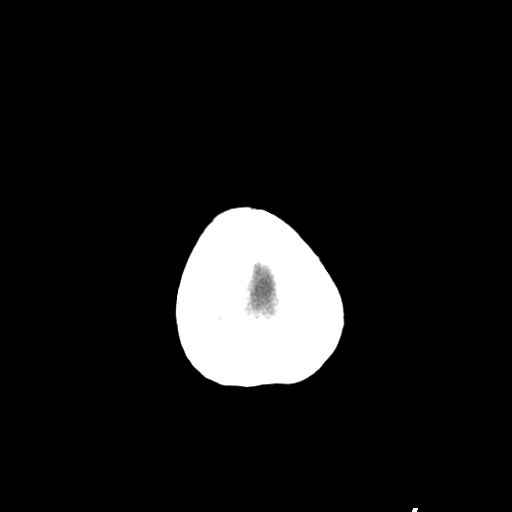

[Series 5: coronal soft tissue · coronal · 0.31mm/px · 3 of 63 slices shown]
[im 21/63  brain]
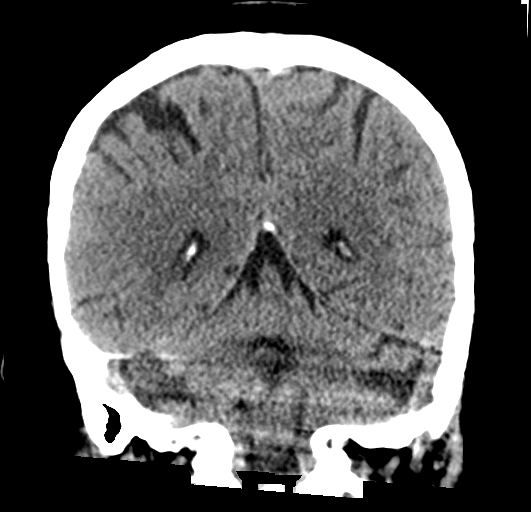
[im 28/63  brain]
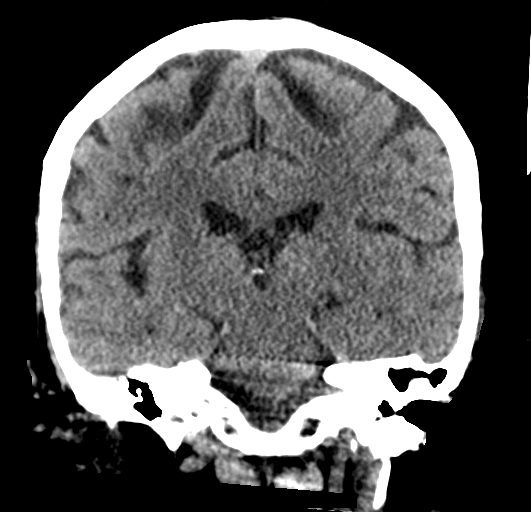
[im 35/63  brain]
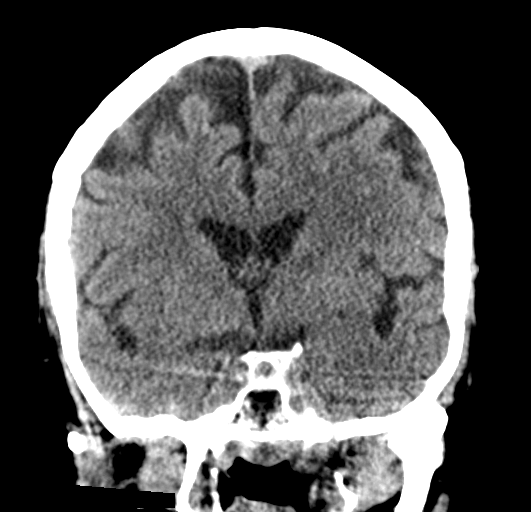

[Series 6: sagittal soft tissue · sagittal · 0.31mm/px · 3 of 56 slices shown]
[im 19/56  brain]
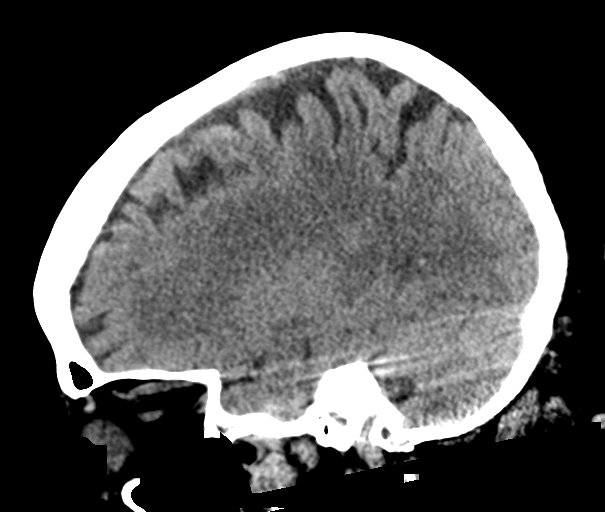
[im 28/56  brain]
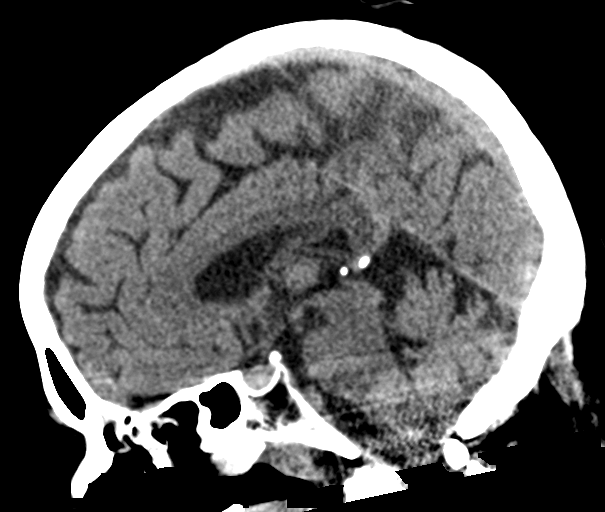
[im 37/56  brain]
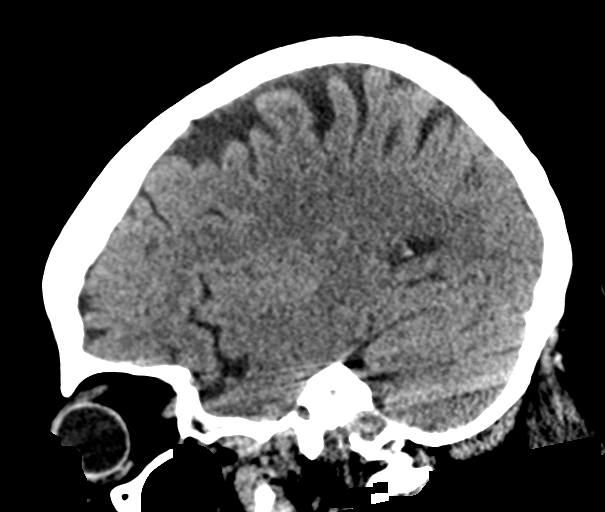

[16 of 47 positions shown; findings below may reference images not displayed]

FINDINGS: Brain: No evidence of acute infarction, hemorrhage, hydrocephalus,
extra-axial collection or mass lesion/mass effect.

Vascular: No hyperdense vessel or unexpected calcification.

Skull: Intact.  No focal lesion.

Sinuses/Orbits: Mucous retention cyst or polyp left maxillary sinus
noted.

Other: None.
IMPRESSION: No acute abnormality.

Mucous retention cyst or polyp left maxillary sinus.

## 2020-07-31 IMAGING — DX DG CHEST 1V PORT
1 series · 1 of 1 positions shown · non-contrast
Comparison: [DATE]

CLINICAL DATA: COVID symptoms for 1 week, initial encounter

EXAM:
PORTABLE CHEST 1 VIEW

[chest ap]
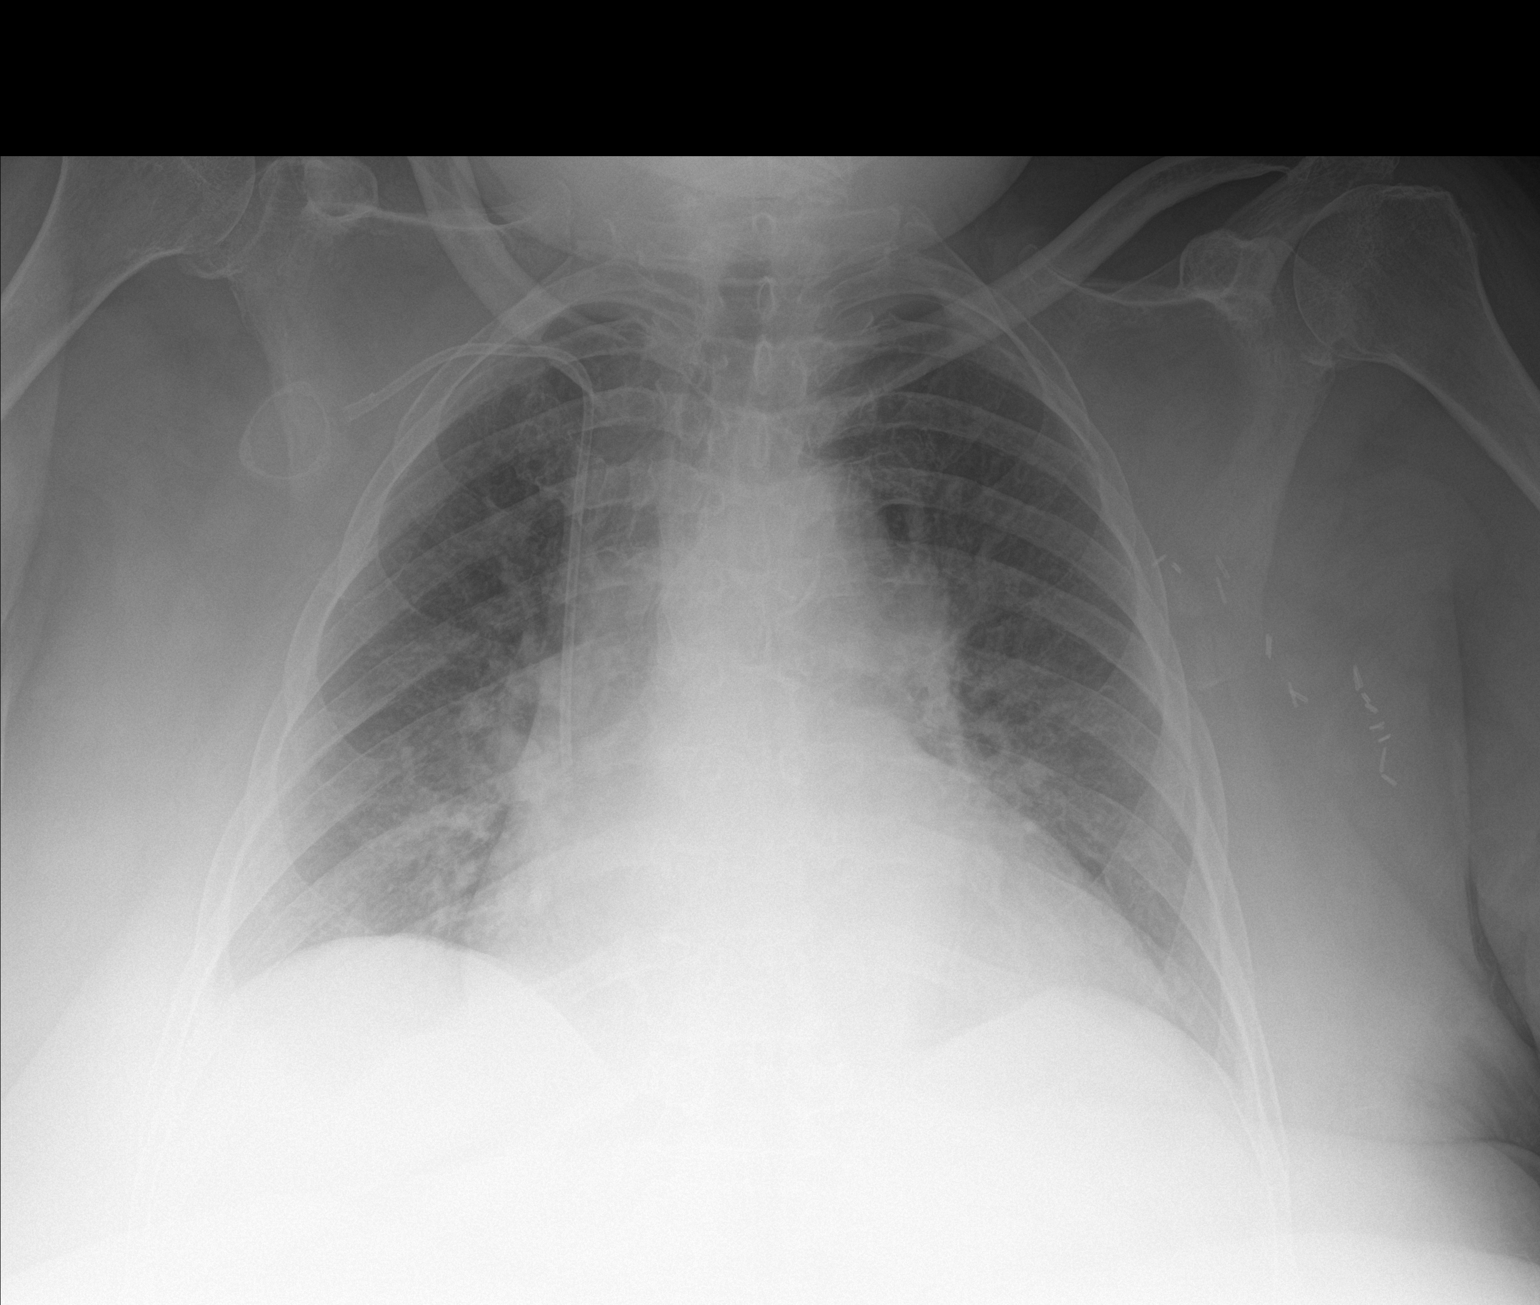

[1 of 1 positions shown; findings below may reference images not displayed]

FINDINGS: Cardiac shadow is mildly enlarged but stable. Aortic calcifications
are again seen. Right chest wall port is noted in satisfactory
position. Mild central vascular congestion is noted without
interstitial edema. No focal infiltrate is seen. Postsurgical
changes on the left are noted.
IMPRESSION: Mild vascular congestion without parenchymal edema.

## 2020-07-31 MED ORDER — ACETAMINOPHEN 325 MG PO TABS
650.0000 mg | ORAL_TABLET | Freq: Once | ORAL | Status: AC
  Administered 2020-07-31: 650 mg via ORAL
  Filled 2020-07-31: qty 2

## 2020-07-31 MED ORDER — ALBUTEROL SULFATE HFA 108 (90 BASE) MCG/ACT IN AERS
2.0000 | INHALATION_SPRAY | Freq: Once | RESPIRATORY_TRACT | Status: AC
  Administered 2020-07-31: 2 via RESPIRATORY_TRACT
  Filled 2020-07-31: qty 6.7

## 2020-07-31 NOTE — ED Provider Notes (Signed)
[facility] DEPT Provider Note   CSN: [id number] Arrival date & time: 07/31/20  1812     History Chief Complaint  Patient presents with  . Covid Exposure    [name] is a 64 y.o. female with PMH of HTN, stage III CKD, morbid obesity, HFpEF, and stage IIb estrogen receptor positive left breast invasive ductal carcinoma s/p radiation and on anastrozole since 02/2020 who presents the ED via EMS with a 1 week history of body aches, fatigue, low-grade fever, and mild shortness of breath symptoms. She reports that her brother is her primary caretaker and was recently diagnosed with COVID-19.  On my examination, patient is very limited in her ability to communicate.  Appears confused.  She keeps grabbing at her head and is having difficulty with word finding.  She tells me that she simply "does not feel good".  She is unable to provide a better history, but does participate in "yes or no" questions.  She endorses having difficulty with word finding x1 day.  She does appear to have mild increased work of breathing, however she is oxygenating well on room air at rest.    Level 5 caveat due to confusion, baseline cognition unclear.    HPI     Past Medical History:  Diagnosis Date  . Arthritis   . Cancer ([place]) 11/2019   Left breast  . Diverticulitis   . Family history of ovarian cancer   . Heart murmur    11/26/19 echo: Mild AS. AV mean gradient 12.0 mmHg; however, LVOT gradient 8 mmHg with intracvitary gradient-significant AS is not suspected  . Hypertension     Patient Active Problem List   Diagnosis Date Noted  . Chronic heart failure with preserved ejection fraction ([place]) 06/26/2020  . Thoracic aortic aneurysm without rupture ([place]) 06/26/2020  . Hyponatremia 05/31/2020  . Pancytopenia ([place]) 05/31/2020  . Cellulitis of thigh 04/29/2020  . Tinea corporis 04/29/2020  . Anemia of chronic disease 04/29/2020  . Chronic kidney disease, stage 3a ([place])  04/29/2020  . Cellulitis of abdominal wall   . Constipation   . Folate deficiency   . Hypomagnesemia   . Debility   . Gout 03/03/2020  . Skin breakdown 03/03/2020  . Skin ulcer of perineum, limited to breakdown of skin ([place]) 03/03/2020  . Leukocytosis 03/03/2020  . Anemia associated with chemotherapy 03/03/2020  . Thrombocytopenia ([place]) 03/03/2020  . Port-A-Cath in place 02/19/2020  . Acute renal failure (ARF) ([place]) 01/22/2020  . S/P left mastectomy 12/19/2019  . Preoperative cardiovascular examination 11/30/2019  . Morbid obesity with BMI of 60.0-69.9, adult ([place]) 11/21/2019  . Family history of ovarian cancer   . Malignant neoplasm of upper-outer quadrant of left breast in female, estrogen receptor positive ([place]) 11/14/2019  . Fracture of radial head, right, closed 06/25/2019  . Frequent falls 01/10/2019  . Bilateral bunions 01/10/2019  . Toenail fungus 01/10/2019  . Alcoholic hepatitis 29/92/4268  . Edema 11/20/2018  . Alcohol-induced mood disorder ([place]) 11/01/2013  . Alcohol dependence ([place]) 11/01/2013  . Essential hypertension 06/20/2007  . LOW BACK PAIN 06/20/2007  . DIVERTICULOSIS, COLON 05/05/2007    Past Surgical History:  Procedure Laterality Date  . MASTECTOMY WITH AXILLARY LYMPH NODE DISSECTION Left 12/19/2019   Procedure: LEFT MASTECTOMY WITH AXILLARY LYMPH NODE DISSECTION;  Surgeon: [name], MD;  Location: [facility];  Service: General;  Laterality: Left;  Marland Kitchen MULTIPLE EXTRACTIONS WITH ALVEOLOPLASTY Bilateral 12/14/2019   Procedure: MULTIPLE EXTRACTION WITH ALVEOLOPLASTY;  Surgeon: [name],  DDS;  Location: [facility];  Service: Oral Surgery;  Laterality: Bilateral;  . PORTACATH PLACEMENT N/A 12/19/2019   Procedure: INSERTION PORT-A-CATH WITH ULTRASOUND GUIDANCE;  Surgeon: [name], MD;  Location: [facility];  Service: General;  Laterality: N/A;  . RADIAL HEAD ARTHROPLASTY Right 06/25/2019   Procedure: RIGHT RADIAL HEAD ARTHROPLASTY;  Surgeon: [name],  MD;  Location: [facility];  Service: Orthopedics;  Laterality: Right;  . TUBAL LIGATION       OB History   No obstetric history on file.     Family History  Problem Relation Age of Onset  . Hypertension Mother   . Dementia Mother   . Ovarian cancer Half-Sister 85  . Diabetes Half-Sister   . Stroke Half-Sister     Social History   Tobacco Use  . Smoking status: Never Smoker  . Smokeless tobacco: Never Used  Vaping Use  . Vaping Use: Never used  Substance Use Topics  . Alcohol use: Yes    Alcohol/week: 3.0 standard drinks    Types: 1 Cans of beer, 2 Shots of liquor per week    Comment: daily  . Drug use: Yes    Types: Marijuana    Home Medications Prior to Admission medications   Medication Sig Start Date End Date Taking? Authorizing Provider  anastrozole (ARIMIDEX) 1 MG tablet Take 1 tablet (1 mg total) by mouth daily. 03/27/20   Magrinat, Virgie Dad, MD  aspirin EC 81 MG EC tablet Take 1 tablet (81 mg total) by mouth daily. Swallow whole. 02/14/20   [name], MD  Blood Pressure Monitor DEVI Use as instructed by provider to check bloos pressure daily. ICD: I10 Patient not taking: Reported on 07/30/2020 02/18/20   Fulp, Cammie, MD  dexamethasone (DECADRON) 4 MG tablet Take 4 mg by mouth daily.  Patient not taking: Reported on 07/16/2020    [provider]  feeding supplement (ENSURE ENLIVE / ENSURE PLUS) LIQD Take 237 mLs by mouth 2 (two) times daily between meals. 06/12/20   Hongalgi, Lenis Dickinson, MD  folic acid (FOLVITE) 1 MG tablet Take 1 tablet (1 mg total) by mouth daily. 03/22/20   Eugenie Filler, MD  furosemide (LASIX) 20 MG tablet Take 1 tablet (20 mg total) by mouth daily. Patient not taking: Reported on 07/16/2020 06/26/20   [name], MD  gabapentin (NEURONTIN) 100 MG capsule Take 1 capsule (100 mg total) by mouth 2 (two) times daily. 07/14/20   Magrinat, Virgie Dad, MD  gabapentin (NEURONTIN) 300 MG capsule Take 1 capsule (300 mg total)  by mouth at bedtime. 07/14/20   Magrinat, Virgie Dad, MD  lidocaine-prilocaine (EMLA) cream Apply 1 application topically as needed. Patient taking differently: Apply 1 application topically as needed (port).  02/05/20   Magrinat, Virgie Dad, MD  Magnesium Oxide 400 MG CAPS Take 1 capsule (400 mg total) by mouth daily. 06/30/20   [name], MD  Multiple Vitamin (MULTIVITAMIN WITH MINERALS) TABS tablet Take 1 tablet by mouth daily. 06/13/20   Hongalgi, Lenis Dickinson, MD  Nutritional Supplements (FEEDING SUPPLEMENT, KATE FARMS STANDARD 1.4,) LIQD liquid Take 325 mLs by mouth 2 (two) times daily between meals. Patient not taking: Reported on 07/16/2020 06/12/20   [name], MD  nystatin (MYCOSTATIN/NYSTOP) powder Apply topically 3 (three) times daily. Apply to under panus. 06/12/20   Hongalgi, Lenis Dickinson, MD  predniSONE (DELTASONE) 20 MG tablet 2 pills once daily x5 days.  Take after eating Patient not taking: Reported on 07/03/2020  06/27/20   Fulp, Cammie, MD  thiamine 100 MG tablet Take 1 tablet (100 mg total) by mouth daily. Patient not taking: Reported on 07/30/2020 06/13/20   [name], MD    Allergies    Amlodipine  Review of Systems   Review of Systems  Unable to perform ROS: Mental status change    Physical Exam Updated Vital Signs BP (!) 186/87 (BP Location: Left Arm)   Pulse 84   Temp 98.4 F (36.9 C) (Oral)   Resp 20   SpO2 100%   Physical Exam Vitals and nursing note reviewed. Exam conducted with a chaperone present.  Constitutional:      Appearance: She is obese.  HENT:     Head: Normocephalic and atraumatic.  Eyes:     General: No scleral icterus.    Conjunctiva/sclera: Conjunctivae normal.     Comments: PERRL.  Will not track finger, but EOMs appear intact.  Cardiovascular:     Rate and Rhythm: Normal rate and regular rhythm.     Pulses: Normal pulses.     Heart sounds: Normal heart sounds.  Pulmonary:     Comments: Mildly increased work of  breathing without any distress.  Mild tachypnea into low 20s.  Oxygenating well on room air while at rest. Musculoskeletal:     Right lower leg: Edema present.     Left lower leg: Edema present.     Comments: Bilateral extremity edema.  Venous stasis dermatitis.    Skin:    General: Skin is dry.  Neurological:     Mental Status: She is alert. She is disoriented.     GCS: GCS eye subscore is 4. GCS verbal subscore is 5. GCS motor subscore is 6.  Psychiatric:        Mood and Affect: Mood normal.        Behavior: Behavior normal.        Thought Content: Thought content normal.     ED Results / Procedures / Treatments   Labs (all labs ordered are listed, but only abnormal results are displayed) Labs Reviewed  RESP PANEL BY RT-PCR (FLU A&B, COVID) [id number]  CULTURE, BLOOD (ROUTINE X 2)  CULTURE, BLOOD (ROUTINE X 2)  LACTIC ACID, PLASMA  LACTIC ACID, PLASMA  CBC WITH DIFFERENTIAL/PLATELET  COMPREHENSIVE METABOLIC PANEL  D-DIMER, QUANTITATIVE (NOT AT [facility])  PROCALCITONIN  LACTATE DEHYDROGENASE  FERRITIN  TRIGLYCERIDES  FIBRINOGEN  C-REACTIVE PROTEIN  BRAIN NATRIURETIC PEPTIDE  AMMONIA    EKG None  Radiology CT Head Wo Contrast  Result Date: 07/31/2020 CLINICAL DATA:  Malaise, body aches and shortness of breath. EXAM: CT HEAD WITHOUT CONTRAST TECHNIQUE: Contiguous axial images were obtained from the base of the skull through the vertex without intravenous contrast. COMPARISON:  None. FINDINGS: Brain: No evidence of acute infarction, hemorrhage, hydrocephalus, extra-axial collection or mass lesion/mass effect. Vascular: No hyperdense vessel or unexpected calcification. Skull: Intact.  No focal lesion. Sinuses/Orbits: Mucous retention cyst or polyp left maxillary sinus noted. Other: None. IMPRESSION: No acute abnormality. Mucous retention cyst or polyp left maxillary sinus. Electronically Signed   By: Inge Rise M.D.   On: 07/31/2020 19:35   DG Chest Port 1 View  Result  Date: 07/31/2020 CLINICAL DATA:  COVID symptoms for 1 week, initial encounter EXAM: PORTABLE CHEST 1 VIEW COMPARISON:  05/31/2020 FINDINGS: Cardiac shadow is mildly enlarged but stable. Aortic calcifications are again seen. Right chest wall port is noted in satisfactory position. Mild central vascular congestion is noted without interstitial edema.  No focal infiltrate is seen. Postsurgical changes on the left are noted. IMPRESSION: Mild vascular congestion without parenchymal edema. Electronically Signed   By: Inez Catalina M.D.   On: 07/31/2020 19:54    Procedures Procedures (including critical care time)  Medications Ordered in ED Medications  albuterol (VENTOLIN HFA) 108 (90 Base) MCG/ACT inhaler 2 puff (2 puffs Inhalation Given 07/31/20 2102)  acetaminophen (TYLENOL) tablet 650 mg (650 mg Oral Given 07/31/20 2103)    ED Course  I have reviewed the triage vital signs and the nursing notes.  Pertinent labs & imaging results that were available during my care of the patient were reviewed by me and considered in my medical decision making (see chart for details).    MDM Rules/Calculators/A&P                          Patient with complex past medical history of sent to the ED for 1 week history of chills and "feeling poorly". She seemingly encephalopathic on my examination and is not answering questions appropriately.  Will obtain comprehensive work-up including CT head given lack of obvious source.  Patient was evaluated by Dr. Darl Householder who agrees with plan.  On subsequent evaluation, will access patient's port to obtain laboratory work-up. She is now no longer having difficulty with word finding, but continues to change her answers repeatedly. She initially denied any shortness of breath symptoms, but upon rolling her to evaluate her backside, stated that she felt short of breath and complained that this is a new problem for her. She also has been informed me that her brother is not COVID-19 positive  and she denies any recent sick contacts. She also denies any cough, chest pain, urinary symptoms, changes in bowel habits, dizziness, blurred vision or other focal neurologic deficits, abdominal pain, nausea or vomiting, or other symptoms. She simply states that she "feels bad" with subjective fevers and chills at home.  At this point, the cause of her encephalopathy and feeling poorly is unclear, however will continue proceed with septic work-up given oral 100.2 F temperature with mildly elevated respiratory rate.  CT head obtained without contrast is without any acute abnormalities. Plain films of chest are also reviewed and demonstrate mild vascular congestion, but without any parenchymal edema or opacities.  Laboratory work-up pending at time of handoff.  At shift change care was transferred to [facility], PA-C who will follow pending studies, re-evaluate, and determine disposition.    Final Clinical Impression(s) / ED Diagnoses Final diagnoses:  Encephalopathy    Rx / DC Orders ED Discharge Orders    None       Corena Herter, PA-C 07/31/20 2226    Drenda Freeze, MD 08/05/20 1451

## 2020-07-31 NOTE — Progress Notes (Signed)
IVT note: Provided RN with PORT supplies- both for power and non power port.

## 2020-07-31 NOTE — ED Triage Notes (Signed)
Pt BIB EMS from home for COVID sx x 1 week - malaise, body aches, and shob. Pt currently undergoing chemo for breast cancer. Pts brother was recently dx with COVID and is the pts primary care taker. Lung sounds clear. 96% on RA.

## 2020-07-31 NOTE — Progress Notes (Signed)
IVT note: Port now is accessed by ED nurse with blood return. Dressing covered with tegaderm.clean,dry,intact.

## 2020-07-31 NOTE — ED Provider Notes (Signed)
64 year old female received at sign out from [place] pending labs and imaging.  Per his HPI:  "[name] is a 64 y.o. female with PMH of HTN, stage III CKD, morbid obesity, HFpEF, and stage IIb estrogen receptor positive left breast invasive ductal carcinoma s/p radiation and on anastrozole since 02/2020 who presents the ED via EMS with a 1 week history of body aches, fatigue, low-grade fever, and mild shortness of breath symptoms. She reports that her brother is her primary caretaker and was recently diagnosed with COVID-19.  On my examination, patient is very limited in her ability to communicate.  Appears confused.  She keeps grabbing at her head and is having difficulty with word finding.  She tells me that she simply "does not feel good".  She is unable to provide a better history, but does participate in "yes or no" questions.  She endorses having difficulty with word finding x1 day.  She does appear to have mild increased work of breathing, however she is oxygenating well on room air at rest.    Level 5 caveat due to confusion, baseline cognition unclear."    Spoke with the patient's niece, Mardene Celeste. She has not spoken with her in several days, but stated that the last time she spoke with her she said the patient had caught a cold.  States that the patient is normally "very with it".  She is unable to provide any additional information at this time.  On my evaluation, patient is confused.  She continues to state the current year is 27.  She does know that she is at a hospital in [facility].  She knows the month is December.  She also is oriented to her first and last name.  She is followed by oncology.  She is currently on anastrozole and radiation therapy.   She received both maternal injections and a booster in Oct 2021.  Physical Exam  BP (!) 178/78 (BP Location: Left Arm)   Pulse 84   Temp (!) 101.8 F (38.8 C) (Rectal)   Resp (!) 22   SpO2 100%   Physical  Exam Vitals and nursing note reviewed.  Constitutional:      General: She is not in acute distress.    Appearance: She is obese. She is not toxic-appearing.     Comments: Chronically ill-appearing  HENT:     Head: Normocephalic.  Eyes:     Conjunctiva/sclera: Conjunctivae normal.  Cardiovascular:     Rate and Rhythm: Normal rate and regular rhythm.     Heart sounds: No murmur heard.  No friction rub. No gallop.   Pulmonary:     Effort: Pulmonary effort is normal. No respiratory distress.     Comments: Rhonchorous sounding cough. Abdominal:     General: There is no distension.     Palpations: Abdomen is soft. There is no mass.     Tenderness: There is no abdominal tenderness. There is no right CVA tenderness, left CVA tenderness, guarding or rebound.     Hernia: No hernia is present.  Musculoskeletal:     Cervical back: Neck supple.     Comments: Lower legs with mild nonpitting peripheral edema   Skin:    General: Skin is warm.     Findings: No rash.  Neurological:     Mental Status: She is alert.     Comments: Oriented to self.  She knows the city and state her [place].  She knows she is in a  hospital.  Lonna Duval the year to be 1957.  Knows the month is December.  Psychiatric:        Behavior: Behavior normal.     ED Course/Procedures     Procedures  MDM  64 y.o. female with PMH of HTN, stage III CKD, morbid obesity, HFpEF, and stage IIb estrogen receptor positive left breast invasive ductal carcinoma s/p radiation and on anastrozole since 02/2020 who was received at signout from [place] pending labs and imaging.  Brief, patient has been feeling poorly for a week.  Her niece stated that she had recently had cold-like symptoms.  In the ER, she is encephalopathic.  She is not oriented to the year.  Mentation has seemed to wax and wane based on previous physical evaluations from [place].  Labs and imaging have been reviewed and independently interpreted by  me.  Chest x-ray with mild vascular congestion.  No consolidation. CT head with no acute findings.   Labs are notable for hypokalemia of 2.9.  IV potassium has been ordered.  Creatinine is 1.55, improved from previous.  BNP is not significantly elevated despite mild vascular congestion noted on x-ray and she does not appear volume overloaded.  D-dimer is elevated, but after COVID-19 test resulted, I suspect this is secondary to COVID-19 infection.  Suspect she is encephalopathic secondary to infection given that she had a negative head CT.  She was given Tylenol upon arrival in the ER.  However, fever returned prior to her second dose.  This is likely also contributing to encephalopathy.  Consult the hospitalist team and Dr. Alcario Drought will accept the patient for admission.  Given encephalopathy in the setting of COVID-19 infection, she will require admission.  The patient appears reasonably stabilized for admission considering the current resources, flow, and capabilities available in the ED at this time, and I doubt any other [facility] requiring further screening and/or treatment in the ED prior to admission.       Joanne Gavel, PA-C 08/01/20 0238    Fatima Blank, MD 08/01/20 2110

## 2020-07-31 NOTE — ED Notes (Signed)
Patient states her port is not a power port. IV Team consulted for needed supplies.

## 2020-08-01 ENCOUNTER — Observation Stay (HOSPITAL_COMMUNITY): Payer: Medicare Other

## 2020-08-01 ENCOUNTER — Inpatient Hospital Stay (HOSPITAL_COMMUNITY): Payer: Medicare Other

## 2020-08-01 ENCOUNTER — Encounter (HOSPITAL_COMMUNITY): Payer: Self-pay | Admitting: Internal Medicine

## 2020-08-01 ENCOUNTER — Other Ambulatory Visit: Payer: Self-pay

## 2020-08-01 DIAGNOSIS — G9341 Metabolic encephalopathy: Secondary | ICD-10-CM | POA: Diagnosis not present

## 2020-08-01 DIAGNOSIS — Z7982 Long term (current) use of aspirin: Secondary | ICD-10-CM | POA: Diagnosis not present

## 2020-08-01 DIAGNOSIS — Z833 Family history of diabetes mellitus: Secondary | ICD-10-CM | POA: Diagnosis not present

## 2020-08-01 DIAGNOSIS — I5032 Chronic diastolic (congestive) heart failure: Secondary | ICD-10-CM | POA: Diagnosis present

## 2020-08-01 DIAGNOSIS — R410 Disorientation, unspecified: Secondary | ICD-10-CM | POA: Diagnosis not present

## 2020-08-01 DIAGNOSIS — C50412 Malignant neoplasm of upper-outer quadrant of left female breast: Secondary | ICD-10-CM

## 2020-08-01 DIAGNOSIS — E876 Hypokalemia: Secondary | ICD-10-CM

## 2020-08-01 DIAGNOSIS — R0902 Hypoxemia: Secondary | ICD-10-CM | POA: Diagnosis present

## 2020-08-01 DIAGNOSIS — U071 COVID-19: Secondary | ICD-10-CM | POA: Diagnosis present

## 2020-08-01 DIAGNOSIS — I10 Essential (primary) hypertension: Secondary | ICD-10-CM

## 2020-08-01 DIAGNOSIS — Z8041 Family history of malignant neoplasm of ovary: Secondary | ICD-10-CM | POA: Diagnosis not present

## 2020-08-01 DIAGNOSIS — Z853 Personal history of malignant neoplasm of breast: Secondary | ICD-10-CM | POA: Diagnosis not present

## 2020-08-01 DIAGNOSIS — F1020 Alcohol dependence, uncomplicated: Secondary | ICD-10-CM

## 2020-08-01 DIAGNOSIS — N1831 Chronic kidney disease, stage 3a: Secondary | ICD-10-CM

## 2020-08-01 DIAGNOSIS — G9349 Other encephalopathy: Secondary | ICD-10-CM

## 2020-08-01 DIAGNOSIS — Z23 Encounter for immunization: Secondary | ICD-10-CM | POA: Diagnosis present

## 2020-08-01 DIAGNOSIS — Z823 Family history of stroke: Secondary | ICD-10-CM | POA: Diagnosis not present

## 2020-08-01 DIAGNOSIS — D631 Anemia in chronic kidney disease: Secondary | ICD-10-CM | POA: Diagnosis present

## 2020-08-01 DIAGNOSIS — N1832 Chronic kidney disease, stage 3b: Secondary | ICD-10-CM | POA: Diagnosis present

## 2020-08-01 DIAGNOSIS — I130 Hypertensive heart and chronic kidney disease with heart failure and stage 1 through stage 4 chronic kidney disease, or unspecified chronic kidney disease: Secondary | ICD-10-CM | POA: Diagnosis present

## 2020-08-01 DIAGNOSIS — J1282 Pneumonia due to coronavirus disease 2019: Secondary | ICD-10-CM | POA: Diagnosis present

## 2020-08-01 DIAGNOSIS — Z79899 Other long term (current) drug therapy: Secondary | ICD-10-CM | POA: Diagnosis not present

## 2020-08-01 DIAGNOSIS — R4701 Aphasia: Secondary | ICD-10-CM | POA: Diagnosis present

## 2020-08-01 DIAGNOSIS — Z888 Allergy status to other drugs, medicaments and biological substances status: Secondary | ICD-10-CM | POA: Diagnosis not present

## 2020-08-01 DIAGNOSIS — Z923 Personal history of irradiation: Secondary | ICD-10-CM | POA: Diagnosis not present

## 2020-08-01 DIAGNOSIS — Z6841 Body Mass Index (BMI) 40.0 and over, adult: Secondary | ICD-10-CM | POA: Diagnosis not present

## 2020-08-01 DIAGNOSIS — Z170 Estrogen receptor positive status [ER+]: Secondary | ICD-10-CM | POA: Diagnosis not present

## 2020-08-01 DIAGNOSIS — Z8249 Family history of ischemic heart disease and other diseases of the circulatory system: Secondary | ICD-10-CM | POA: Diagnosis not present

## 2020-08-01 DIAGNOSIS — R0602 Shortness of breath: Secondary | ICD-10-CM | POA: Diagnosis present

## 2020-08-01 LAB — RESP PANEL BY RT-PCR (FLU A&B, COVID) ARPGX2
Influenza A by PCR: NEGATIVE
Influenza B by PCR: NEGATIVE
SARS Coronavirus 2 by RT PCR: POSITIVE — AB

## 2020-08-01 LAB — COMPREHENSIVE METABOLIC PANEL
ALT: 29 U/L (ref 0–44)
AST: 58 U/L — ABNORMAL HIGH (ref 15–41)
Albumin: 3.1 g/dL — ABNORMAL LOW (ref 3.5–5.0)
Alkaline Phosphatase: 40 U/L (ref 38–126)
Anion gap: 14 (ref 5–15)
BUN: 22 mg/dL (ref 8–23)
CO2: 19 mmol/L — ABNORMAL LOW (ref 22–32)
Calcium: 8.1 mg/dL — ABNORMAL LOW (ref 8.9–10.3)
Chloride: 103 mmol/L (ref 98–111)
Creatinine, Ser: 1.55 mg/dL — ABNORMAL HIGH (ref 0.44–1.00)
GFR, Estimated: 37 mL/min — ABNORMAL LOW (ref >60)
Glucose, Bld: 80 mg/dL (ref 70–99)
Potassium: 2.9 mmol/L — ABNORMAL LOW (ref 3.5–5.1)
Sodium: 136 mmol/L (ref 135–145)
Total Bilirubin: 0.7 mg/dL (ref 0.3–1.2)
Total Protein: 7 g/dL (ref 6.5–8.1)

## 2020-08-01 LAB — BASIC METABOLIC PANEL
Anion gap: 12 (ref 5–15)
BUN: 19 mg/dL (ref 8–23)
CO2: 18 mmol/L — ABNORMAL LOW (ref 22–32)
Calcium: 7.1 mg/dL — ABNORMAL LOW (ref 8.9–10.3)
Chloride: 108 mmol/L (ref 98–111)
Creatinine, Ser: 1.39 mg/dL — ABNORMAL HIGH (ref 0.44–1.00)
GFR, Estimated: 42 mL/min — ABNORMAL LOW (ref >60)
Glucose, Bld: 81 mg/dL (ref 70–99)
Potassium: 3.2 mmol/L — ABNORMAL LOW (ref 3.5–5.1)
Sodium: 138 mmol/L (ref 135–145)

## 2020-08-01 LAB — C-REACTIVE PROTEIN: CRP: 5.7 mg/dL — ABNORMAL HIGH (ref <1.0)

## 2020-08-01 LAB — BRAIN NATRIURETIC PEPTIDE: B Natriuretic Peptide: 112.5 pg/mL — ABNORMAL HIGH (ref 0.0–100.0)

## 2020-08-01 LAB — LACTIC ACID, PLASMA: Lactic Acid, Venous: 1.2 mmol/L (ref 0.5–1.9)

## 2020-08-01 LAB — AMMONIA: Ammonia: 17 umol/L (ref 9–35)

## 2020-08-01 LAB — TRIGLYCERIDES: Triglycerides: 68 mg/dL (ref <150)

## 2020-08-01 LAB — LACTATE DEHYDROGENASE: LDH: 190 U/L (ref 98–192)

## 2020-08-01 LAB — PHOSPHORUS: Phosphorus: 3.6 mg/dL (ref 2.5–4.6)

## 2020-08-01 LAB — MAGNESIUM
Magnesium: 1.1 mg/dL — ABNORMAL LOW (ref 1.7–2.4)
Magnesium: 1.2 mg/dL — ABNORMAL LOW (ref 1.7–2.4)

## 2020-08-01 LAB — PROCALCITONIN: Procalcitonin: 0.1 ng/mL

## 2020-08-01 LAB — FERRITIN: Ferritin: 1082 ng/mL — ABNORMAL HIGH (ref 11–307)

## 2020-08-01 IMAGING — MR MR HEAD W/O CM
10 series · 43 of 48 positions shown · non-contrast
Comparison: Head CT [DATE].  Head CT [DATE].

CLINICAL DATA: Transient ischemic attack (TIA). Additional
provided: Confusion, COVID positive.

EXAM:
MRI HEAD WITHOUT CONTRAST
TECHNIQUE: Multiplanar, multiecho pulse sequences of the brain and surrounding
structures were obtained without intravenous contrast.

[Series 5: dwi_tracew · axial · 3.0mm · 1.08mm/px · z∈[-68,+90]mm · 8 of 108 slices shown]
[im 1/108]
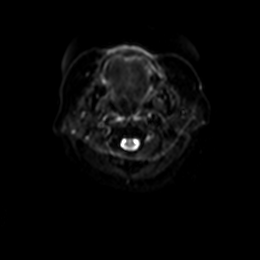
[im 20/108]
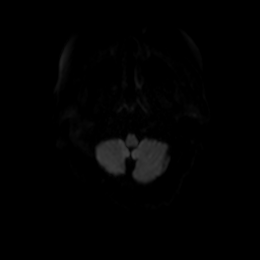
[im 30/108]
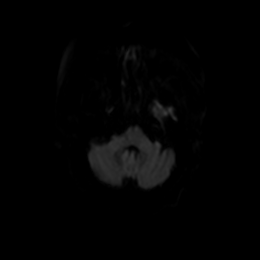
[im 49/108]
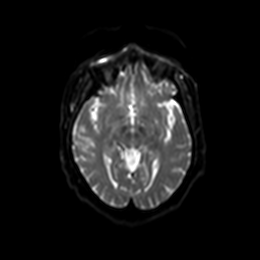
[im 59/108]
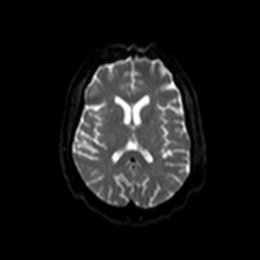
[im 78/108]
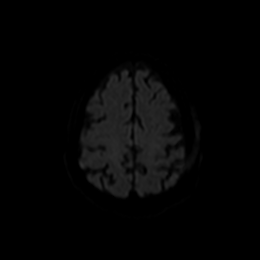
[im 88/108]
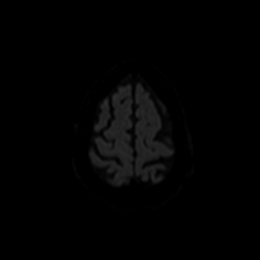
[im 108/108]
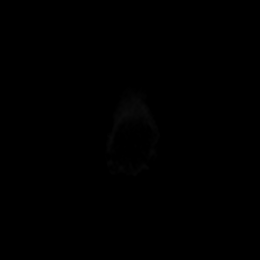

[Series 6: dwi_adc · axial · 3.0mm · 1.08mm/px · z∈[-68,+48]mm · 4 of 54 slices shown]
[im 1/54]
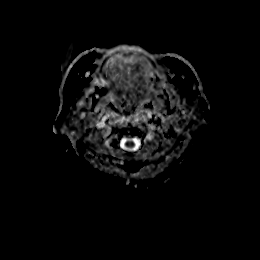
[im 14/54]
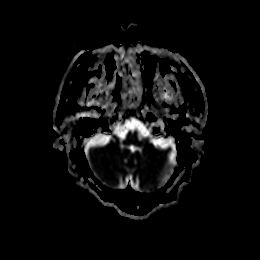
[im 27/54]
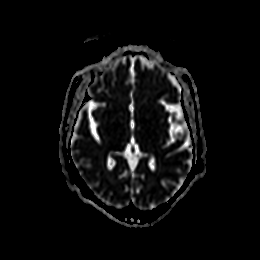
[im 40/54]
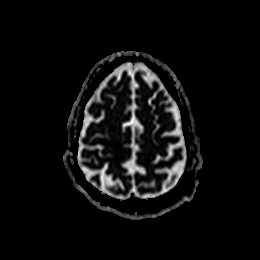

[Series 7: T2 · sagittal · 5.0mm · 0.47mm/px · 2 of 25 slices shown (1 of 3)]
[im 1/25]
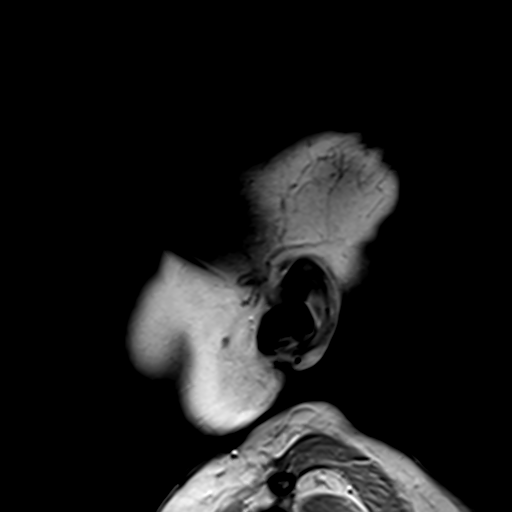
[im 25/25]
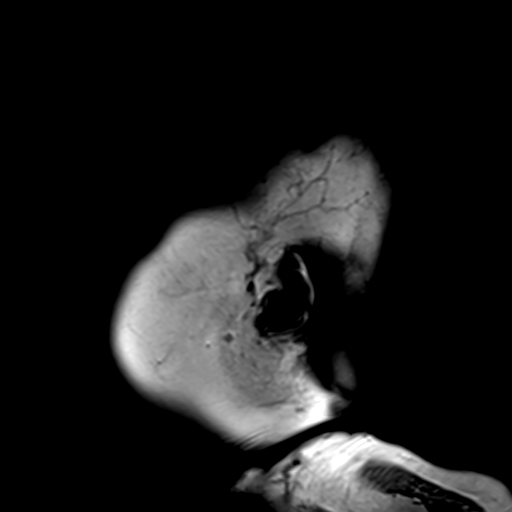

[Series 8: T2 · axial · 5.0mm · 0.45mm/px · z∈[-72,+83]mm · 2 of 25 slices shown (2 of 3)]
[im 1/25]
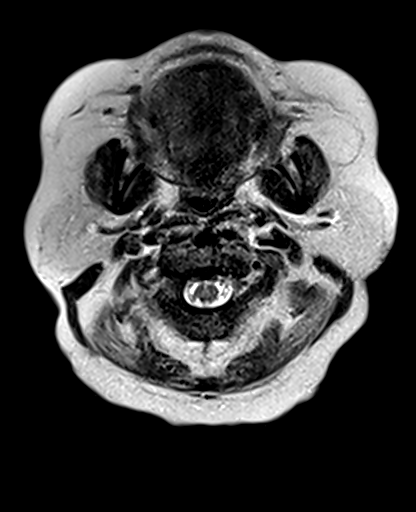
[im 25/25]
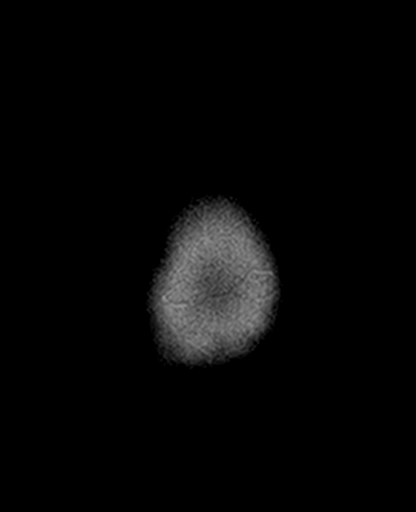

[Series 9: GRE · axial · 3.0mm · 0.45mm/px · z∈[-74,+84]mm · 5 of 54 slices shown]
[im 1/54]
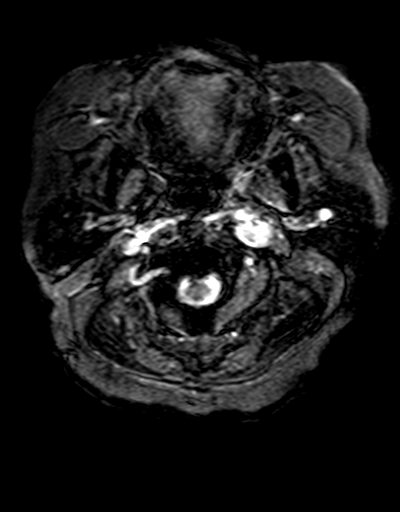
[im 14/54]
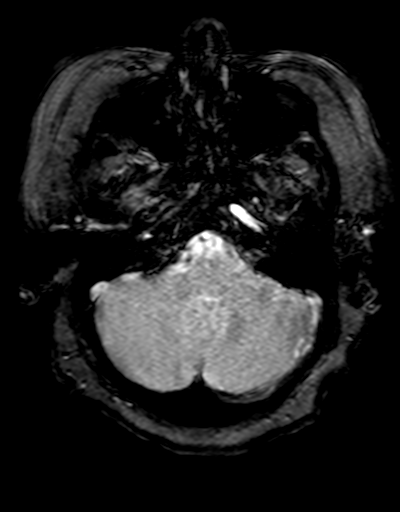
[im 27/54]
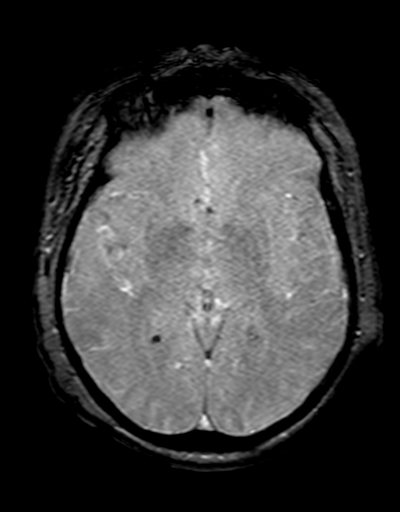
[im 40/54]
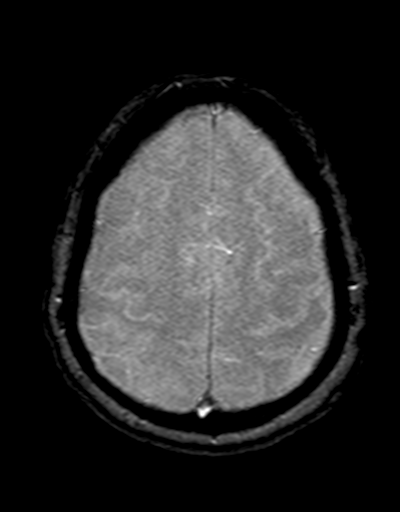
[im 54/54]
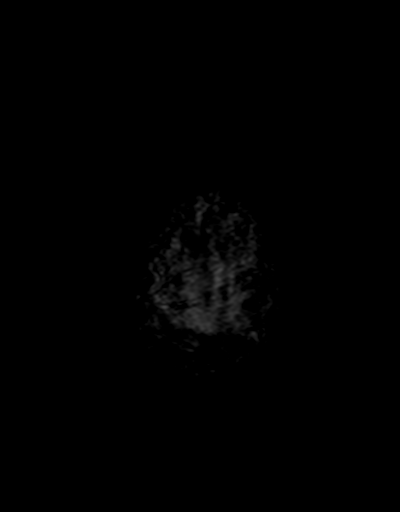

[Series 10: FLAIR · axial · 3.0mm · 0.86mm/px · z∈[-73,+82]mm · 5 of 53 slices shown]
[im 1/53]
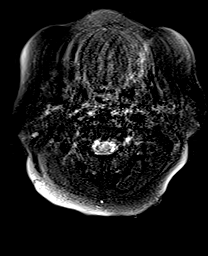
[im 14/53]
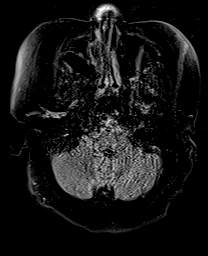
[im 27/53]
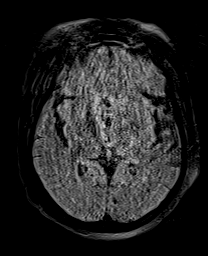
[im 40/53]
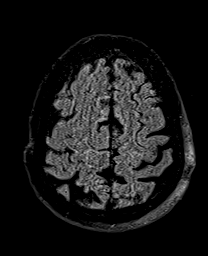
[im 53/53]
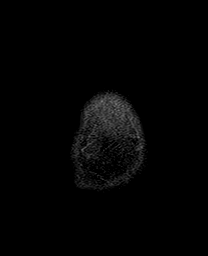

[Series 11: T1 · axial · 3.0mm · 0.45mm/px · z∈[-74,+84]mm · 5 of 54 slices shown]
[im 1/54]
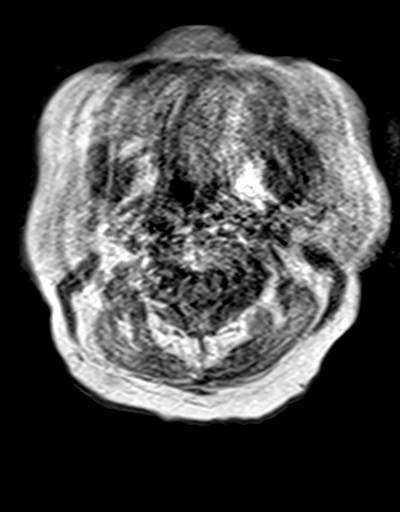
[im 14/54]
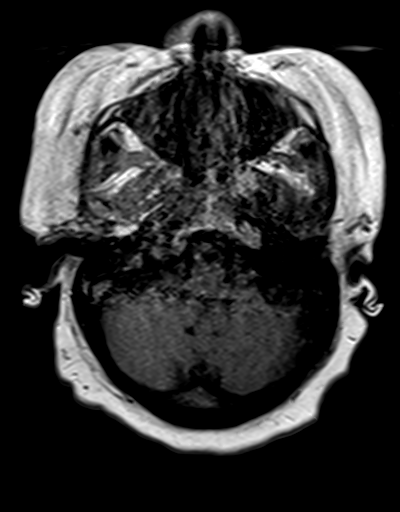
[im 27/54]
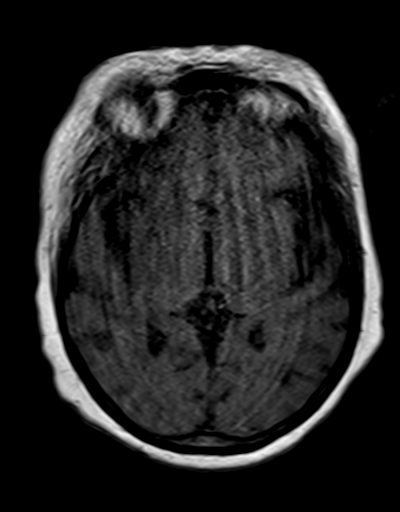
[im 40/54]
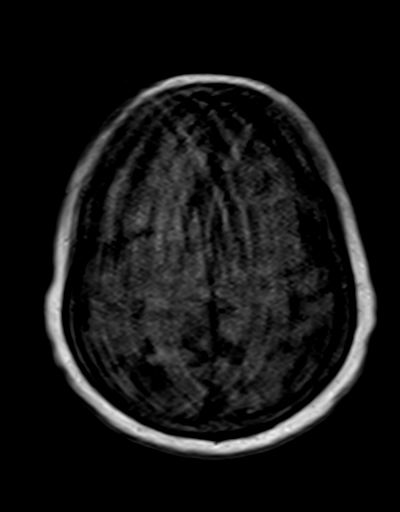
[im 54/54]
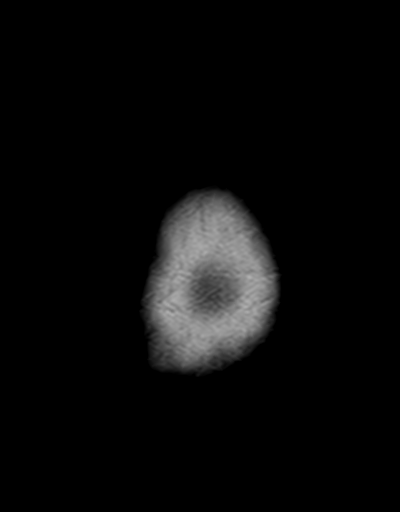

[Series 12: DWI · coronal · 5.0mm · 1.31mm/px · 6 of 56 slices shown (1 of 2)]
[im 1/56]
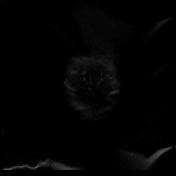
[im 12/56]
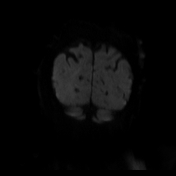
[im 23/56]
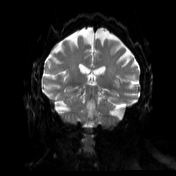
[im 34/56]
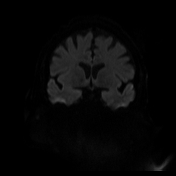
[im 45/56]
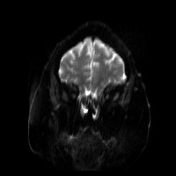
[im 56/56]
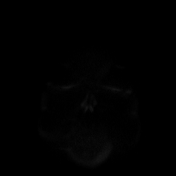

[Series 13: DWI · coronal · 5.0mm · 1.31mm/px · 3 of 28 slices shown (2 of 2)]
[im 1/28]
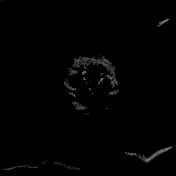
[im 14/28]
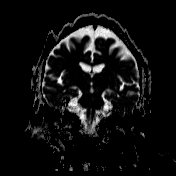
[im 28/28]
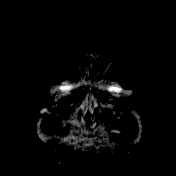

[Series 14: T2 · coronal · 5.0mm · 0.86mm/px · 3 of 28 slices shown (3 of 3)]
[im 1/28]
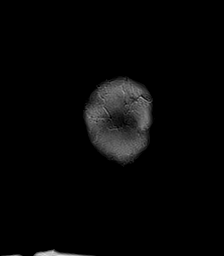
[im 14/28]
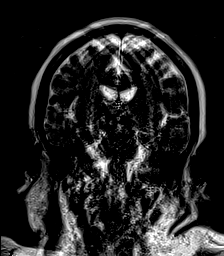
[im 28/28]
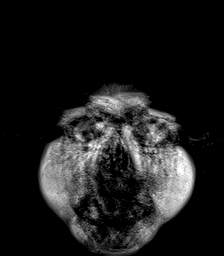

[43 of 48 positions shown; findings below may reference images not displayed]

FINDINGS: Brain:

The examination is motion degraded. Most notably, there is severe
motion degradation of the axial T2/FLAIR sequence, severe motion
degradation of the axial T1 weighted sequence and severe motion
degradation of the coronal T2 weighted sequence.

Mild cerebral and cerebellar atrophy.

Mild multifocal T2/FLAIR hyperintensity within the cerebral white
matter is nonspecific, but compatible chronic small vessel ischemic
disease. This is poorly assessed due to the degree of motion
degradation on the T2/FLAIR sequence

The diffusion-weighted imaging is of good quality. No evidence of
acute infarction.

There is no acute infarct.

No evidence of intracranial mass.

No chronic intracranial blood products.

No extra-axial fluid collection.

No midline shift.

Vascular: Expected proximal arterial flow voids.

Skull and upper cervical spine: No focal marrow lesion. Nonspecific
reversal of the expected cervical lordosis. Cervical spondylosis.

Sinuses/Orbits: Visualized orbits show no acute finding. Paranasal
sinus mucosal thickening. Most notably, there is moderate bilateral
ethmoid sinus mucosal thickening. Moderate-sized left maxillary
sinus mucous retention cyst.

Other: 11 mm T1 hyperintense lesion within the midline nasopharynx
likely reflecting a Tornwaldt cyst.
IMPRESSION: Significantly motion degraded examination as described.

The diffusion-weighted imaging is of good quality. No evidence of
acute infarction.

Mild parenchymal atrophy and chronic small vessel ischemic disease.

Paranasal sinus disease as described.

## 2020-08-01 MED ORDER — CHLORHEXIDINE GLUCONATE CLOTH 2 % EX PADS
6.0000 | MEDICATED_PAD | Freq: Every day | CUTANEOUS | Status: DC
  Administered 2020-08-01 – 2020-08-03 (×3): 6 via TOPICAL

## 2020-08-01 MED ORDER — LABETALOL HCL 5 MG/ML IV SOLN
10.0000 mg | INTRAVENOUS | Status: DC | PRN
  Administered 2020-08-01: 10 mg via INTRAVENOUS
  Administered 2020-08-01: 20 mg via INTRAVENOUS
  Filled 2020-08-01: qty 4

## 2020-08-01 MED ORDER — GUAIFENESIN-DM 100-10 MG/5ML PO SYRP
10.0000 mL | ORAL_SOLUTION | ORAL | Status: DC | PRN
  Administered 2020-08-02: 10 mL via ORAL
  Filled 2020-08-01: qty 10

## 2020-08-01 MED ORDER — ADULT MULTIVITAMIN W/MINERALS CH
1.0000 | ORAL_TABLET | Freq: Every day | ORAL | Status: DC
  Administered 2020-08-01 – 2020-08-03 (×3): 1 via ORAL
  Filled 2020-08-01 (×3): qty 1

## 2020-08-01 MED ORDER — NYSTATIN 100000 UNIT/GM EX POWD
Freq: Three times a day (TID) | CUTANEOUS | Status: DC
  Filled 2020-08-01 (×2): qty 15

## 2020-08-01 MED ORDER — DIPHENHYDRAMINE HCL 50 MG/ML IJ SOLN: 50.0000 mg | Freq: Once | INTRAMUSCULAR | Status: DC | PRN

## 2020-08-01 MED ORDER — SODIUM CHLORIDE 0.9 % IV SOLN
Freq: Once | INTRAVENOUS | Status: DC
  Filled 2020-08-01: qty 5

## 2020-08-01 MED ORDER — ONDANSETRON HCL 4 MG/2ML IJ SOLN: 4.0000 mg | Freq: Four times a day (QID) | INTRAMUSCULAR | Status: DC | PRN

## 2020-08-01 MED ORDER — SODIUM CHLORIDE 0.9 % IV SOLN: INTRAVENOUS | Status: DC | PRN

## 2020-08-01 MED ORDER — EPINEPHRINE 0.3 MG/0.3ML IJ SOAJ
0.3000 mg | Freq: Once | INTRAMUSCULAR | Status: DC | PRN
  Filled 2020-08-01: qty 0.6

## 2020-08-01 MED ORDER — SODIUM CHLORIDE 0.9 % IV SOLN
500.0000 mg | Freq: Once | INTRAVENOUS | Status: DC
  Filled 2020-08-01: qty 500

## 2020-08-01 MED ORDER — SODIUM CHLORIDE 0.9 % IV SOLN: 100.0000 mg | Freq: Every day | INTRAVENOUS | Status: DC

## 2020-08-01 MED ORDER — THIAMINE HCL 100 MG/ML IJ SOLN: 100.0000 mg | Freq: Every day | INTRAMUSCULAR | Status: DC

## 2020-08-01 MED ORDER — FOLIC ACID 1 MG PO TABS
1.0000 mg | ORAL_TABLET | Freq: Every day | ORAL | Status: DC
  Administered 2020-08-01 – 2020-08-03 (×3): 1 mg via ORAL
  Filled 2020-08-01 (×3): qty 1

## 2020-08-01 MED ORDER — POTASSIUM CHLORIDE 10 MEQ/50ML IV SOLN
10.0000 meq | INTRAVENOUS | Status: AC
  Administered 2020-08-01 (×4): 10 meq via INTRAVENOUS
  Filled 2020-08-01 (×6): qty 50

## 2020-08-01 MED ORDER — MAGNESIUM SULFATE 2 GM/50ML IV SOLN
2.0000 g | Freq: Once | INTRAVENOUS | Status: AC
  Administered 2020-08-01: 2 g via INTRAVENOUS
  Filled 2020-08-01: qty 50

## 2020-08-01 MED ORDER — ANASTROZOLE 1 MG PO TABS
1.0000 mg | ORAL_TABLET | Freq: Every day | ORAL | Status: DC
  Administered 2020-08-01 – 2020-08-03 (×3): 1 mg via ORAL
  Filled 2020-08-01 (×4): qty 1

## 2020-08-01 MED ORDER — METHYLPREDNISOLONE SODIUM SUCC 125 MG IJ SOLR: 125.0000 mg | Freq: Once | INTRAMUSCULAR | Status: DC | PRN

## 2020-08-01 MED ORDER — LORAZEPAM 1 MG PO TABS: 1.0000 mg | ORAL_TABLET | ORAL | Status: DC | PRN

## 2020-08-01 MED ORDER — LABETALOL HCL 5 MG/ML IV SOLN
INTRAVENOUS | Status: AC
  Filled 2020-08-01: qty 4

## 2020-08-01 MED ORDER — ONDANSETRON HCL 4 MG PO TABS: 4.0000 mg | ORAL_TABLET | Freq: Four times a day (QID) | ORAL | Status: DC | PRN

## 2020-08-01 MED ORDER — LORAZEPAM 2 MG/ML IJ SOLN: 1.0000 mg | INTRAMUSCULAR | Status: DC | PRN

## 2020-08-01 MED ORDER — SODIUM CHLORIDE 0.9 % IV SOLN
200.0000 mg | Freq: Once | INTRAVENOUS | Status: AC
  Administered 2020-08-01: 200 mg via INTRAVENOUS
  Filled 2020-08-01: qty 200

## 2020-08-01 MED ORDER — POTASSIUM CHLORIDE 10 MEQ/100ML IV SOLN
10.0000 meq | INTRAVENOUS | Status: AC
  Administered 2020-08-01 (×2): 10 meq via INTRAVENOUS
  Filled 2020-08-01 (×2): qty 100

## 2020-08-01 MED ORDER — SODIUM CHLORIDE 0.9 % IV SOLN
1.0000 g | Freq: Once | INTRAVENOUS | Status: DC
  Filled 2020-08-01: qty 10

## 2020-08-01 MED ORDER — SODIUM CHLORIDE 0.9 % IV SOLN
Freq: Once | INTRAVENOUS | Status: DC
  Filled 2020-08-01: qty 20

## 2020-08-01 MED ORDER — FAMOTIDINE IN NACL 20-0.9 MG/50ML-% IV SOLN: 20.0000 mg | Freq: Once | INTRAVENOUS | Status: DC | PRN

## 2020-08-01 MED ORDER — MORPHINE SULFATE (PF) 4 MG/ML IV SOLN
4.0000 mg | Freq: Once | INTRAVENOUS | Status: DC
  Filled 2020-08-01: qty 1

## 2020-08-01 MED ORDER — SODIUM CHLORIDE 0.9% FLUSH
10.0000 mL | INTRAVENOUS | Status: DC | PRN
  Administered 2020-08-03: 10 mL

## 2020-08-01 MED ORDER — SODIUM CHLORIDE 0.9% FLUSH
10.0000 mL | Freq: Two times a day (BID) | INTRAVENOUS | Status: DC
  Administered 2020-08-01 – 2020-08-03 (×3): 10 mL

## 2020-08-01 MED ORDER — THIAMINE HCL 100 MG PO TABS
100.0000 mg | ORAL_TABLET | Freq: Every day | ORAL | Status: DC
  Administered 2020-08-01 – 2020-08-03 (×3): 100 mg via ORAL
  Filled 2020-08-01 (×3): qty 1

## 2020-08-01 MED ORDER — SOTROVIMAB 500 MG/8ML IV SOLN
500.0000 mg | Freq: Once | INTRAVENOUS | Status: DC
  Filled 2020-08-01: qty 8

## 2020-08-01 MED ORDER — GABAPENTIN 100 MG PO CAPS
100.0000 mg | ORAL_CAPSULE | Freq: Two times a day (BID) | ORAL | Status: DC
  Administered 2020-08-01 – 2020-08-03 (×5): 100 mg via ORAL
  Filled 2020-08-01 (×5): qty 1

## 2020-08-01 MED ORDER — ACETAMINOPHEN 325 MG PO TABS
650.0000 mg | ORAL_TABLET | Freq: Four times a day (QID) | ORAL | Status: DC | PRN
  Administered 2020-08-01 – 2020-08-02 (×3): 650 mg via ORAL
  Filled 2020-08-01 (×3): qty 2

## 2020-08-01 MED ORDER — ALBUTEROL SULFATE HFA 108 (90 BASE) MCG/ACT IN AERS: 2.0000 | INHALATION_SPRAY | Freq: Once | RESPIRATORY_TRACT | Status: DC | PRN

## 2020-08-01 MED ORDER — ENOXAPARIN SODIUM 60 MG/0.6ML ~~LOC~~ SOLN
60.0000 mg | SUBCUTANEOUS | Status: DC
  Administered 2020-08-02 – 2020-08-03 (×2): 60 mg via SUBCUTANEOUS
  Filled 2020-08-01 (×3): qty 0.6

## 2020-08-01 MED ORDER — GABAPENTIN 300 MG PO CAPS
300.0000 mg | ORAL_CAPSULE | Freq: Every day | ORAL | Status: DC
  Administered 2020-08-01 – 2020-08-02 (×2): 300 mg via ORAL
  Filled 2020-08-01 (×2): qty 1

## 2020-08-01 MED ORDER — HYDROCOD POLST-CPM POLST ER 10-8 MG/5ML PO SUER
5.0000 mL | Freq: Two times a day (BID) | ORAL | Status: DC | PRN
  Administered 2020-08-01: 5 mL via ORAL
  Filled 2020-08-01: qty 5

## 2020-08-01 MED ORDER — POTASSIUM CHLORIDE CRYS ER 20 MEQ PO TBCR
20.0000 meq | EXTENDED_RELEASE_TABLET | Freq: Once | ORAL | Status: AC
  Administered 2020-08-01: 20 meq via ORAL
  Filled 2020-08-01: qty 1

## 2020-08-01 MED ORDER — ASPIRIN EC 81 MG PO TBEC
81.0000 mg | DELAYED_RELEASE_TABLET | Freq: Every day | ORAL | Status: DC
  Administered 2020-08-01 – 2020-08-03 (×3): 81 mg via ORAL
  Filled 2020-08-01 (×3): qty 1

## 2020-08-01 MED ORDER — FUROSEMIDE 40 MG PO TABS
20.0000 mg | ORAL_TABLET | Freq: Every day | ORAL | Status: DC
  Administered 2020-08-01: 20 mg via ORAL
  Filled 2020-08-01: qty 1

## 2020-08-01 NOTE — Progress Notes (Signed)
[facility] Progress Note  Patient: [name]    CHE:[id number]  DOA: 07/31/2020     Date of Service: the patient was seen and examined on 08/01/2020  Brief hospital course: Past medical history of alcohol abuse, HTN, breast cancer SP definitive surgery and radiation on medication, obesity.  Presents with complaints of confusion and expressive aphasia.  Found to have fever and work-up was positive for Covid infection. Currently plan is current treatment.  Assessment and Plan: 1.  Acute encephalopathy likely metabolic in the setting of fever Expressive aphasia Currently speech improving. No focal deficit exam wise as well. Patient remains at risk for CVA therefore will perform MRI. Given her COVID-19 diagnosis unable to perform MRI until the end of shift. Cleared to swallow diet. PT OT evaluation. Monitor on telemetry.  2.  COVID-19 infection Monitor hypoxia monitor inflammatory markers. Initially was started on remdesivir although given lack of hypoxia currently we'll discontinue remdesivir. Discussed with the pharmacy and map therapy initiated.  Currently on hold secondary to ongoing fever. Holding steroids given lack of hypoxia. Will monitor.  3.  Hypokalemia Hypomagnesemia Replacing.  Will monitor.  4.  Chronic anemia Likely nutritional deficiency. We'll monitor.  5.  Chronic kidney disease stage IIIb. Renal function stable.  Monitor.  6.  Essential hypertension Currently holding blood pressure medication.  Monitor.  7.  Breast cancer. At risk for metastasis to brain. We'll check MRI brain for that reason as well. Monitor.  8.  Alcohol abuse. On CIWA.  9.  Morbid obesity Body mass index is 52.44 kg/m.  Placing the patient in a poor outcome in the setting of COVID-19 pneumonia.  Monitor.  Diet: Cardiac diet DVT Prophylaxis: Subcutaneous Lovenox      Advance goals of care discussion: Full code  Family Communication: no family was present at  bedside, at the time of interview.   Disposition:  Status is: Inpatient  Remains inpatient appropriate because:IV treatments appropriate due to intensity of illness or inability to take PO   Dispo: The patient is from: Home              Anticipated d/c is to: Home              Anticipated d/c date is: 2 days              Patient currently is not medically stable to d/c.  Subjective: No nausea no vomiting.  No fever no chills.  Still fatigue and tiredness.  Still generalized body ache.  Minimal oral intake.  Physical Exam:  General: Appear in mild distress, no Rash; Oral Mucosa Clear, moist. no Abnormal Neck Mass Or lumps, Conjunctiva normal  Cardiovascular: S1 and S2 Present, no Murmur, Respiratory: good respiratory effort, Bilateral Air entry present and CTA, no Crackles, no wheezes Abdomen: Bowel Sound present, Soft and no tenderness Extremities: no Pedal edema Neurology: alert and oriented to time, place, and person affect appropriate. no new focal deficit Gait not checked due to patient safety concerns  Vitals:   08/01/20 1128 08/01/20 1217 08/01/20 1629 08/01/20 1829  BP: (!) 140/116 (!) 155/52 (!) 177/94 (!) 159/54  Pulse: 77 82 91   Resp: 16 20 20    Temp:  98.4 F (36.9 C) (!) 102.4 F (39.1 C) (!) 100.6 F (38.1 C)  TempSrc:  Oral Oral Oral  SpO2: 100% 98% 97% 99%  Weight:        Intake/Output Summary (Last 24 hours) at 08/01/2020 1909 Last data filed at 08/01/2020 1230  Gross per 24 hour  Intake 512.04 ml  Output 400 ml  Net 112.04 ml   Filed Weights   08/01/20 0200  Weight: 125.9 kg    Data Reviewed: I have personally reviewed and interpreted daily labs, tele strips, imagings as discussed above. I reviewed all nursing notes, pharmacy notes, vitals, pertinent old records I have discussed plan of care as described above with RN and patient/family.  CBC: Recent Labs  Lab 07/31/20 2303  WBC 4.8  NEUTROABS 3.4  HGB 8.8*  HCT 26.7*  MCV 95.4  PLT 497    Basic Metabolic Panel: Recent Labs  Lab 07/31/20 2303 08/01/20 0229 08/01/20 0925  NA 136  --  138  K 2.9*  --  3.2*  CL 103  --  108  CO2 19*  --  18*  GLUCOSE 80  --  81  BUN 22  --  19  CREATININE 1.55*  --  1.39*  CALCIUM 8.1*  --  7.1*  MG  --  1.1* 1.2*  PHOS  --  3.6  --     Studies: CT Head Wo Contrast  Result Date: 07/31/2020 CLINICAL DATA:  Malaise, body aches and shortness of breath. EXAM: CT HEAD WITHOUT CONTRAST TECHNIQUE: Contiguous axial images were obtained from the base of the skull through the vertex without intravenous contrast. COMPARISON:  None. FINDINGS: Brain: No evidence of acute infarction, hemorrhage, hydrocephalus, extra-axial collection or mass lesion/mass effect. Vascular: No hyperdense vessel or unexpected calcification. Skull: Intact.  No focal lesion. Sinuses/Orbits: Mucous retention cyst or polyp left maxillary sinus noted. Other: None. IMPRESSION: No acute abnormality. Mucous retention cyst or polyp left maxillary sinus. Electronically Signed   By: Inge Rise M.D.   On: 07/31/2020 19:35   DG Chest Port 1 View  Result Date: 07/31/2020 CLINICAL DATA:  COVID symptoms for 1 week, initial encounter EXAM: PORTABLE CHEST 1 VIEW COMPARISON:  05/31/2020 FINDINGS: Cardiac shadow is mildly enlarged but stable. Aortic calcifications are again seen. Right chest wall port is noted in satisfactory position. Mild central vascular congestion is noted without interstitial edema. No focal infiltrate is seen. Postsurgical changes on the left are noted. IMPRESSION: Mild vascular congestion without parenchymal edema. Electronically Signed   By: Inez Catalina M.D.   On: 07/31/2020 19:54    Scheduled Meds: . anastrozole  1 mg Oral Daily  . aspirin EC  81 mg Oral Daily  . Chlorhexidine Gluconate Cloth  6 each Topical Daily  . enoxaparin (LOVENOX) injection  60 mg Subcutaneous Q24H  . folic acid  1 mg Oral Daily  . gabapentin  100 mg Oral BID  . gabapentin  300 mg  Oral QHS  . multivitamin with minerals  1 tablet Oral Daily  . nystatin   Topical TID  . sodium chloride flush  10-40 mL Intracatheter Q12H  . thiamine  100 mg Oral Daily   Or  . thiamine  100 mg Intravenous Daily   Continuous Infusions: . sodium chloride    . bamlanivimab / etesevimab IVPB    . famotidine (PEPCID) IV     PRN Meds: sodium chloride, acetaminophen, albuterol, chlorpheniramine-HYDROcodone, diphenhydrAMINE, EPINEPHrine, famotidine (PEPCID) IV, guaiFENesin-dextromethorphan, labetalol, LORazepam **OR** LORazepam, methylPREDNISolone (SOLU-MEDROL) injection, ondansetron **OR** ondansetron (ZOFRAN) IV, sodium chloride flush  Time spent: 35 minutes  Author: Berle Mull, MD [facility] 08/01/2020 7:09 PM  To reach On-call, see care teams to locate the attending and reach out via [email or web address]. Between 7PM-7AM, please contact night-coverage If you still have difficulty  reaching the attending provider, please page the [facility] (Director on Call) for [facility] on amion for assistance.

## 2020-08-01 NOTE — Progress Notes (Signed)
Pharmacy COVID-19 Monoclonal Antibody Screening  [name] was identified as being not hospitalized with symptoms from Covid-19 on admission but an incidental positive PCR has been documented.  The patient may qualify for the use of monoclonal antibodies (mAB) for COVID-19 viral infection to prevent worsening symptoms stemming from Covid-19 infection.  The patient was identified based on a positive COVID-19 PCR and not requiring the use of supplemental oxygen at this time.  This patient meets the FDA criteria for Emergency Use Authorization of casirivimab/imdevimab or bamlanivimab/etesevimab.  Has a (+) direct SARS-CoV-2 viral test result  Is NOT hospitalized due to COVID-19  Is within 10 days of symptom onset  Has at least one of the high risk factor(s) for progression to severe COVID-19 and/or hospitalization as defined in EUA.  Specific high risk criteria : BMI > 25 and Immunosuppressive Disease or Treatment  Additionally: The patient has not had a positive COVID-19 PCR in the last 90 days.  The patient is fully vaccinated against COVID-19.  Since the patient is partially or fully vaccinated for COVID-19, has mild symptoms, and meets high risk criteria, the patient is eligible for mAB administration.   This eligibility and indication for treatment was discussed with the patient's physician: Dr. Posey Pronto  Plan: Based on the above discussion, it was decided that the patient will receive one dose of the available COVID-19 mAB combination. Pharmacy will coordinate administration timing with patient's nurse. Recommended infusion monitoring parameters communicated to the nursing team.   Gretta Arab PharmD, BCPS Clinical Pharmacist [facility] main pharmacy [phone] 08/01/2020 12:24 PM

## 2020-08-01 NOTE — Progress Notes (Signed)
Patient safely cleared for MRI via family members. Dr. Posey Pronto is aware that COVID positive patients will have to be attempted towards end of shift, due to downtime cleaning.

## 2020-08-01 NOTE — Progress Notes (Signed)
   08/01/20 1629  Assess: MEWS Score  Temp (!) 102.4 F (39.1 C)  BP (!) 177/94  Pulse Rate 91  Resp 20  SpO2 97 %  O2 Device Room Air  Assess: MEWS Score  MEWS Temp 2  MEWS Systolic 0  MEWS Pulse 0  MEWS RR 0  MEWS LOC 0  MEWS Score 2  MEWS Score Color Yellow  Assess: if the MEWS score is Yellow or Red  Were vital signs taken at a resting state? Yes  Focused Assessment No change from prior assessment  Early Detection of Sepsis Score *See Row Information* Low  MEWS guidelines implemented *See Row Information* Yes  Treat  MEWS Interventions Administered prn meds/treatments  Pain Scale 0-10  Pain Score 0  Take Vital Signs  Increase Vital Sign Frequency  Yellow: Q 2hr X 2 then Q 4hr X 2, if remains yellow, continue Q 4hrs  Escalate  MEWS: Escalate Yellow: discuss with charge nurse/RN and consider discussing with provider and RRT  Notify: Provider  Provider Name/Title Posey Pronto, RN   Date Provider Notified 08/01/20  Time Provider Notified 1700  Notification Type Page  Notification Reason Change in status  Response No new orders  Date of Provider Response 08/01/20  Time of Provider Response 1700  Document  Patient Outcome Stabilized after interventions  Progress note created (see row info) Yes   Pt is in the Yellow MEWS d/t temp 102.4 and BP 177/94. Pt has complaints of sweating, and chills. RN administered prn tylenol and prn IV labetalol 10 mg. Will implement yellow mews. Will continue to monitor closely.

## 2020-08-01 NOTE — H&P (Signed)
History and Physical    [name] OXB:[id number] DOB: 10-Dec-1955 DOA: 07/31/2020  PCP: [name], MD  Patient coming from: Home  I have personally briefly reviewed patient's old medical records in [name]  Chief Complaint: AMS  HPI: [name] is [name] 64 y.o. female with medical history significant of EtOH abuse, HTN, breast CA s/p definitive surgery, radiation, and on anastrazole with no known recurrence, morbid obesity.  Pt presents to ED with c/o 1 week history of body aches, fatigue, low-grade fever, mild SOB, and intermittent confusion.  Brother who his her primary caregiver was recently diagnosed with COVID-19.  Patient is fully vaccinated to [name] and had booster shot in Oct.  Pt reports difficulty with word finding for past 1 day.  Pt has confusion: states current year 1957, knows shes in hospital in [name], knows month, oriented to self.  Pt states she "does not feel good".  Not really able to give much more in the way of history.  ED Course: Tm 101.8.  WBC nl, COVID positive.  Procalcitonin 0.10.  Creat 1.55 (baseline).  K 2.9, Mg 1.1.  CXR shows mild vasc congestion.  BP 180/120.  HGB 8.8 (baseline and chronic).   Review of Systems: ROS not felt to be accurate or doable based on mental status.  Past Medical History:  Diagnosis Date  . Arthritis   . Cancer ([name]) 11/2019   Left breast  . Diverticulitis   . Family history of ovarian cancer   . Heart murmur    11/26/19 echo: Mild AS. AV mean gradient 12.0 mmHg; however, LVOT gradient 8 mmHg with intracvitary gradient-significant AS is not suspected  . Hypertension     Past Surgical History:  Procedure Laterality Date  . MASTECTOMY WITH AXILLARY LYMPH NODE DISSECTION Left 12/19/2019   Procedure: LEFT MASTECTOMY WITH AXILLARY LYMPH NODE DISSECTION;  Surgeon: [name], MD;  Location: [place];  Service: General;  Laterality: Left;  Marland Kitchen MULTIPLE EXTRACTIONS WITH ALVEOLOPLASTY Bilateral  12/14/2019   Procedure: MULTIPLE EXTRACTION WITH ALVEOLOPLASTY;  Surgeon: [name], DDS;  Location: [name];  Service: Oral Surgery;  Laterality: Bilateral;  . PORTACATH PLACEMENT N/[name] 12/19/2019   Procedure: INSERTION PORT-[name]-CATH WITH ULTRASOUND GUIDANCE;  Surgeon: [name], MD;  Location: [place];  Service: General;  Laterality: N/[name];  . RADIAL HEAD ARTHROPLASTY Right 06/25/2019   Procedure: RIGHT RADIAL HEAD ARTHROPLASTY;  Surgeon: [name], MD;  Location: [place];  Service: Orthopedics;  Laterality: Right;  . TUBAL LIGATION       reports that she has never smoked. She has never used smokeless tobacco. She reports current alcohol use of about 3.0 standard drinks of alcohol per week. She reports current drug use. Drug: Marijuana.  Allergies  Allergen Reactions  . Amlodipine Swelling    BLE edema    Family History  Problem Relation Age of Onset  . Hypertension Mother   . Dementia Mother   . Ovarian cancer Half-Sister 47  . Diabetes Half-Sister   . Stroke Half-Sister      Prior to Admission medications   Medication Sig Start Date End Date Taking? Authorizing Provider  anastrozole (ARIMIDEX) 1 MG tablet Take 1 tablet (1 mg total) by mouth daily. 03/27/20  Yes Magrinat, Virgie Dad, MD  aspirin EC 81 MG EC tablet Take 1 tablet (81 mg total) by mouth daily. Swallow whole. 02/14/20  Yes Sue Sells, MD  furosemide (LASIX) 20 MG tablet Take 1 tablet (20 mg total) by mouth daily. 06/26/20  Yes Chandrasekhar, Mahesh A, MD  gabapentin (NEURONTIN) 100 MG capsule Take 1 capsule (100 mg total) by mouth 2 (two) times daily. 07/14/20  Yes Magrinat, Virgie Dad, MD  gabapentin (NEURONTIN) 300 MG capsule Take 1 capsule (300 mg total) by mouth at bedtime. 07/14/20  Yes Magrinat, Virgie Dad, MD  lidocaine-prilocaine (EMLA) cream Apply 1 application topically as needed. Patient taking differently: Apply 1 application topically as needed (port).  02/05/20  Yes Magrinat, Virgie Dad, MD  nystatin  (MYCOSTATIN/NYSTOP) powder Apply topically 3 (three) times daily. Apply to under panus. 06/12/20  Yes Hongalgi, Lenis Dickinson, MD  thiamine 100 MG tablet Take 1 tablet (100 mg total) by mouth daily. 06/13/20  Yes Hongalgi, Lenis Dickinson, MD  Blood Pressure Monitor DEVI Use as instructed by provider to check bloos pressure daily. ICD: I10 Patient not taking: Reported on 07/30/2020 02/18/20   [name], MD  Magnesium Oxide 400 MG CAPS Take 1 capsule (400 mg total) by mouth daily. Patient not taking: Reported on 07/31/2020 06/30/20   Sue Lean, MD  Multiple Vitamin (MULTIVITAMIN WITH MINERALS) TABS tablet Take 1 tablet by mouth daily. Patient not taking: Reported on 07/31/2020 06/13/20   Sue Jansky, MD  Nutritional Supplements (FEEDING SUPPLEMENT, KATE FARMS STANDARD 1.4,) LIQD liquid Take 325 mLs by mouth 2 (two) times daily between meals. Patient not taking: Reported on 07/16/2020 06/12/20   Sue Jansky, MD    Physical Exam: Vitals:   08/01/20 0130 08/01/20 0200 08/01/20 0251 08/01/20 0254  BP: (!) 176/140  (!) 176/108 (!) 176/108  Pulse: 86  84   Resp: (!) 21  (!) 21   Temp:   98.9 F (37.2 C)   TempSrc:   Oral   SpO2: 100%  100%   Weight:  125.9 kg      Constitutional: NAD, calm, comfortable Eyes: PERRL, lids and conjunctivae normal ENMT: Mucous membranes are moist. Posterior pharynx clear of any exudate or lesions.Normal dentition.  Neck: normal, supple, no masses, no thyromegaly Respiratory: Persistent cough. Cardiovascular: Regular rate and rhythm, no murmurs / rubs / gallops. No extremity edema. 2+ pedal pulses. No carotid bruits.  Abdomen: no tenderness, no masses palpated. No hepatosplenomegaly. Bowel sounds positive.  Musculoskeletal: no clubbing / cyanosis. No joint deformity upper and lower extremities. Good ROM, no contractures. Normal muscle tone.  Skin: no rashes, lesions, ulcers. No induration Neurologic: CN 2-12 grossly intact. Sensation intact, DTR  normal. Strength 5/5 in all 4.  Psychiatric: Oriented to self and location.   Labs on Admission: I have personally reviewed following labs and imaging studies  CBC: Recent Labs  Lab 07/31/20 2303  WBC 4.8  NEUTROABS 3.4  HGB 8.8*  HCT 26.7*  MCV 95.4  PLT 676   Basic Metabolic Panel: Recent Labs  Lab 07/31/20 2303 08/01/20 0229  NA 136  --   K 2.9*  --   CL 103  --   CO2 19*  --   GLUCOSE 80  --   BUN 22  --   CREATININE 1.55*  --   CALCIUM 8.1*  --   MG  --  1.1*  PHOS  --  3.6   GFR: Estimated Creatinine Clearance: 45.7 mL/min ([name]) (by C-G formula based on SCr of 1.55 mg/dL (H)). Liver Function Tests: Recent Labs  Lab 07/31/20 2303  AST 58*  ALT 29  ALKPHOS 40  BILITOT 0.7  PROT 7.0  ALBUMIN 3.1*   No results for input(s): LIPASE, AMYLASE in the last  168 hours. Recent Labs  Lab 07/31/20 2258  AMMONIA 17   Coagulation Profile: No results for input(s): INR, PROTIME in the last 168 hours. Cardiac Enzymes: No results for input(s): CKTOTAL, CKMB, CKMBINDEX, TROPONINI in the last 168 hours. BNP (last 3 results) No results for input(s): PROBNP in the last 8760 hours. HbA1C: No results for input(s): HGBA1C in the last 72 hours. CBG: No results for input(s): GLUCAP in the last 168 hours. Lipid Profile: Recent Labs    07/31/20 2303  TRIG 68   Thyroid Function Tests: No results for input(s): TSH, T4TOTAL, FREET4, T3FREE, THYROIDAB in the last 72 hours. Anemia Panel: No results for input(s): VITAMINB12, FOLATE, FERRITIN, TIBC, IRON, RETICCTPCT in the last 72 hours. Urine analysis:    Component Value Date/Time   COLORURINE YELLOW 05/31/2020 1923   APPEARANCEUR CLEAR 05/31/2020 1923   LABSPEC 1.012 05/31/2020 1923   PHURINE 5.0 05/31/2020 1923   GLUCOSEU NEGATIVE 05/31/2020 1923   HGBUR NEGATIVE 05/31/2020 1923   BILIRUBINUR NEGATIVE 05/31/2020 [place] NEGATIVE 05/31/2020 1923   PROTEINUR 100 ([name]) 05/31/2020 1923   UROBILINOGEN 1.0  07/14/2012 1630   NITRITE NEGATIVE 05/31/2020 1923   LEUKOCYTESUR NEGATIVE 05/31/2020 1923    Radiological Exams on Admission: CT Head Wo Contrast  Result Date: 07/31/2020 CLINICAL DATA:  Malaise, body aches and shortness of breath. EXAM: CT HEAD WITHOUT CONTRAST TECHNIQUE: Contiguous axial images were obtained from the base of the skull through the vertex without intravenous contrast. COMPARISON:  None. FINDINGS: Brain: No evidence of acute infarction, hemorrhage, hydrocephalus, extra-axial collection or mass lesion/mass effect. Vascular: No hyperdense vessel or unexpected calcification. Skull: Intact.  No focal lesion. Sinuses/Orbits: Mucous retention cyst or polyp left maxillary sinus noted. Other: None. IMPRESSION: No acute abnormality. Mucous retention cyst or polyp left maxillary sinus. Electronically Signed   By: Inge Rise M.D.   On: 07/31/2020 19:35   DG Chest Port 1 View  Result Date: 07/31/2020 CLINICAL DATA:  COVID symptoms for 1 week, initial encounter EXAM: PORTABLE CHEST 1 VIEW COMPARISON:  05/31/2020 FINDINGS: Cardiac shadow is mildly enlarged but stable. Aortic calcifications are again seen. Right chest wall port is noted in satisfactory position. Mild central vascular congestion is noted without interstitial edema. No focal infiltrate is seen. Postsurgical changes on the left are noted. IMPRESSION: Mild vascular congestion without parenchymal edema. Electronically Signed   By: Inez Catalina M.D.   On: 07/31/2020 19:54    EKG: Independently reviewed.  Assessment/Plan Principal Problem:   Acute metabolic encephalopathy Active Problems:   Essential hypertension   Alcohol dependence ([place])   Malignant neoplasm of upper-outer quadrant of left breast in female, estrogen receptor positive ([place])   Anemia of chronic disease   Chronic kidney disease, stage 3a ([place])   COVID-19 virus infection    1. Acute metabolic encephalopathy - 1. Appears to have delirium in setting of  COVID-19 viral illness 2. Tylenol PRN fever 2. COVID-19 1. COVID pathway 2. Remdesivir 3. Cont pulse ox 4. Thankfully no O2 requirement at this time: no indication for steroids nor immunomodulators 5. Hopefully patients vaccine + booster status keeps it from getting worse. 6. Consider addition of MAB therapy in AM? Not clear if FDA allowing [place] to do this on Hospitalized patients yet or not though. 3. Anemia of CKD - 1. Chronic, stable, and baseline 2. Monitor CBC while here 4. EtOH abuse - 1. CIWA 2. Unclear when last drink was, is known she was still drinking as of Oct admission (  based on H+P at that time). 5. CKD 3 - chronic and stable 6. HTN - 1. Cont home lasix 2. Add PRN labetalol 7. Breast CA - 1. Cont anastrozole 2. Believed to be in remission at this time s/p definitive surgery and radiation. 3. Not on cytotoxic chemotherapy at this time (just anastrozole)  DVT prophylaxis: Lovenox Code Status: Full Family Communication: No family in room Disposition Plan: Home after mental status improved Consults called: None Admission status: Place in 74    Yerania Chamorro, [place] [facility]  How to contact the [facility] Attending or Consulting provider [place] or covering provider during after hours [place], for this patient?  1. Check the care team in [facility] and look for [name]) attending/consulting TRH provider listed and b) the [facility] team listed 2. Log into [email or web address]  Amion Physician Scheduling and messaging for groups and whole hospitals  On call and physician scheduling software for group practices, residents, hospitalists and other medical providers for call, clinic, rotation and shift schedules. On[employer] is [name] hospital-wide system for scheduling doctors and paging doctors on call. EasyPlot is for scientific plotting and data analysis.  [email or web address]  and use [place]'s universal password to access. If you do not have the password, please contact the hospital operator.    3. Locate the [facility] provider you are looking for under [facility] and page to [name] number that you can be directly reached. 4. If you still have difficulty reaching the provider, please page the [facility] (Director on Call) for the Hospitalists listed on amion for assistance.  08/01/2020, 3:04 AM

## 2020-08-02 LAB — CBC WITH DIFFERENTIAL/PLATELET
Abs Immature Granulocytes: 0.02 10*3/uL (ref 0.00–0.07)
Basophils Absolute: 0 10*3/uL (ref 0.0–0.1)
Basophils Relative: 0 %
Eosinophils Absolute: 0.1 10*3/uL (ref 0.0–0.5)
Eosinophils Relative: 4 %
HCT: 25.3 % — ABNORMAL LOW (ref 36.0–46.0)
Hemoglobin: 8.3 g/dL — ABNORMAL LOW (ref 12.0–15.0)
Immature Granulocytes: 1 %
Lymphocytes Relative: 47 %
Lymphs Abs: 1.1 10*3/uL (ref 0.7–4.0)
MCH: 31.4 pg (ref 26.0–34.0)
MCHC: 32.8 g/dL (ref 30.0–36.0)
MCV: 95.8 fL (ref 80.0–100.0)
Monocytes Absolute: 0.4 10*3/uL (ref 0.1–1.0)
Monocytes Relative: 16 %
Neutro Abs: 0.7 10*3/uL — ABNORMAL LOW (ref 1.7–7.7)
Neutrophils Relative %: 32 %
Platelets: 145 10*3/uL — ABNORMAL LOW (ref 150–400)
RBC: 2.64 MIL/uL — ABNORMAL LOW (ref 3.87–5.11)
RDW: 13.5 % (ref 11.5–15.5)
WBC: 2.3 10*3/uL — ABNORMAL LOW (ref 4.0–10.5)
nRBC: 0 % (ref 0.0–0.2)

## 2020-08-02 LAB — COMPREHENSIVE METABOLIC PANEL
ALT: 23 U/L (ref 0–44)
AST: 40 U/L (ref 15–41)
Albumin: 2.6 g/dL — ABNORMAL LOW (ref 3.5–5.0)
Alkaline Phosphatase: 40 U/L (ref 38–126)
Anion gap: 12 (ref 5–15)
BUN: 18 mg/dL (ref 8–23)
CO2: 22 mmol/L (ref 22–32)
Calcium: 7.6 mg/dL — ABNORMAL LOW (ref 8.9–10.3)
Chloride: 100 mmol/L (ref 98–111)
Creatinine, Ser: 1.31 mg/dL — ABNORMAL HIGH (ref 0.44–1.00)
GFR, Estimated: 45 mL/min — ABNORMAL LOW (ref >60)
Glucose, Bld: 109 mg/dL — ABNORMAL HIGH (ref 70–99)
Potassium: 2.9 mmol/L — ABNORMAL LOW (ref 3.5–5.1)
Sodium: 134 mmol/L — ABNORMAL LOW (ref 135–145)
Total Bilirubin: 0.5 mg/dL (ref 0.3–1.2)
Total Protein: 6.2 g/dL — ABNORMAL LOW (ref 6.5–8.1)

## 2020-08-02 LAB — D-DIMER, QUANTITATIVE: D-Dimer, Quant: 1.83 ug/mL-FEU — ABNORMAL HIGH (ref 0.00–0.50)

## 2020-08-02 LAB — C-REACTIVE PROTEIN: CRP: 7.7 mg/dL — ABNORMAL HIGH (ref <1.0)

## 2020-08-02 MED ORDER — POTASSIUM CHLORIDE CRYS ER 20 MEQ PO TBCR
40.0000 meq | EXTENDED_RELEASE_TABLET | ORAL | Status: AC
  Administered 2020-08-02: 40 meq via ORAL
  Filled 2020-08-02 (×2): qty 2

## 2020-08-02 MED ORDER — SODIUM CHLORIDE 0.9 % IV BOLUS
500.0000 mL | Freq: Once | INTRAVENOUS | Status: AC
  Administered 2020-08-02: 500 mL via INTRAVENOUS

## 2020-08-02 MED ORDER — ZINC SULFATE 220 (50 ZN) MG PO CAPS
220.0000 mg | ORAL_CAPSULE | Freq: Every day | ORAL | Status: DC
  Administered 2020-08-02 – 2020-08-03 (×2): 220 mg via ORAL
  Filled 2020-08-02 (×2): qty 1

## 2020-08-02 MED ORDER — ASCORBIC ACID 500 MG PO TABS
500.0000 mg | ORAL_TABLET | Freq: Every day | ORAL | Status: DC
  Administered 2020-08-02 – 2020-08-03 (×2): 500 mg via ORAL
  Filled 2020-08-02 (×2): qty 1

## 2020-08-02 MED ORDER — SODIUM CHLORIDE 0.9 % IV SOLN
Freq: Once | INTRAVENOUS | Status: AC
  Filled 2020-08-02: qty 20

## 2020-08-02 NOTE — Progress Notes (Signed)
[facility] Progress Note  Patient: [name]    WCB:[id number]  DOA: 07/31/2020     Date of Service: the patient was seen and examined on 08/02/2020  Brief hospital course: Past medical history of alcohol abuse, HTN, breast cancer SP definitive surgery and radiation on medication, obesity.  Presents with complaints of confusion and expressive aphasia.  Found to have fever and work-up was positive for Covid infection. Currently plan is current treatment.  Assessment and Plan: 1.  Acute encephalopathy likely metabolic in the setting of fever Expressive aphasia Currently speech improving. No focal deficit exam wise as well. MRI brain negative for any acute stroke as well. Cleared to swallow diet. PT OT evaluation. Monitor on telemetry.  2.  COVID-19 infection Monitor hypoxia  Monitor inflammatory markers. Initially was started on remdesivir although given lack of hypoxia currently we'll discontinue remdesivir. Discussed with the pharmacy and maB therapy initiated. Holding steroids given lack of hypoxia. Will monitor.  3.  Hypokalemia Hypomagnesemia Replacing.  Will monitor.  4.  Chronic anemia Likely nutritional deficiency. We'll monitor.  5.  Chronic kidney disease stage IIIb. Renal function stable.  Monitor.  6.  Essential hypertension Currently holding blood pressure medication.  Monitor.  7.  Breast cancer. At risk for metastasis to brain. MRI brain without contrast negative. Monitor.  8.  Alcohol abuse. On CIWA.  9.  Morbid obesity Body mass index is 52.44 kg/m.  Placing the patient in a poor outcome in the setting of COVID-19 pneumonia.  Monitor.  Diet: Cardiac diet DVT Prophylaxis: Subcutaneous Lovenox   Advance goals of care discussion: Full code  Family Communication: no family was present at bedside, at the time of interview.  Discussed with niece on the phone.  Likely discharge home tomorrow.  Disposition:  Status is:  Inpatient  Remains inpatient appropriate because:IV treatments appropriate due to intensity of illness or inability to take PO   Dispo: The patient is from: Home              Anticipated d/c is to: Home              Anticipated d/c date is: Likely tomorrow              Patient currently is not medically stable to d/c.  Subjective: Reports fatigue and tiredness.  Still has fever.  Still minimal oral intake.  Hypokalemia.  Physical Exam:  General: Appear in mild distress, no Rash; Oral Mucosa Clear, moist. no Abnormal Neck Mass Or lumps, Conjunctiva normal  Cardiovascular: S1 and S2 Present, no Murmur, Respiratory: good respiratory effort, Bilateral Air entry present and CTA, no Crackles, no wheezes Abdomen: Bowel Sound present, Soft and no tenderness Extremities: no Pedal edema Neurology: alert and oriented to time, place, and person affect appropriate. no new focal deficit Gait not checked due to patient safety concerns  Vitals:   08/01/20 2336 08/02/20 0422 08/02/20 0630 08/02/20 1223  BP: (!) 156/77 (!) 146/69  136/74  Pulse: 83 85  86  Resp: 19 16  16   Temp: 99.6 F (37.6 C) (!) 100.7 F (38.2 C) 100 F (37.8 C) 98.2 F (36.8 C)  TempSrc: Oral Oral Oral Oral  SpO2: 99% 99%  100%  Weight:      Height:        Intake/Output Summary (Last 24 hours) at 08/02/2020 1919 Last data filed at 08/02/2020 1845 Gross per 24 hour  Intake 680 ml  Output 2300 ml  Net -1620 ml   [name]  Weights   08/01/20 0200  Weight: 125.9 kg    Data Reviewed: I have personally reviewed and interpreted daily labs, tele strips, imagings as discussed above. I reviewed all nursing notes, pharmacy notes, vitals, pertinent old records I have discussed plan of care as described above with RN and patient/family.  CBC: Recent Labs  Lab 07/31/20 2303 08/02/20 0500  WBC 4.8 2.3*  NEUTROABS 3.4 0.7*  HGB 8.8* 8.3*  HCT 26.7* 25.3*  MCV 95.4 95.8  PLT 155 938*   Basic Metabolic Panel: Recent  Labs  Lab 07/31/20 2303 08/01/20 0229 08/01/20 0925 08/02/20 0500  NA 136  --  138 134*  K 2.9*  --  3.2* 2.9*  CL 103  --  108 100  CO2 19*  --  18* 22  GLUCOSE 80  --  81 109*  BUN 22  --  19 18  CREATININE 1.55*  --  1.39* 1.31*  CALCIUM 8.1*  --  7.1* 7.6*  MG  --  1.1* 1.2*  --   PHOS  --  3.6  --   --     Studies: MR BRAIN WO CONTRAST  Result Date: 08/01/2020 CLINICAL DATA:  Transient ischemic attack (TIA). Additional provided: Confusion, COVID positive. EXAM: MRI HEAD WITHOUT CONTRAST TECHNIQUE: Multiplanar, multiecho pulse sequences of the brain and surrounding structures were obtained without intravenous contrast. COMPARISON:  Head CT 07/31/2020.  Head CT 11/08/2007. FINDINGS: Brain: The examination is motion degraded. Most notably, there is severe motion degradation of the axial T2/FLAIR sequence, severe motion degradation of the axial T1 weighted sequence and severe motion degradation of the coronal T2 weighted sequence. Mild cerebral and cerebellar atrophy. Mild multifocal T2/FLAIR hyperintensity within the cerebral white matter is nonspecific, but compatible chronic small vessel ischemic disease. This is poorly assessed due to the degree of motion degradation on the T2/FLAIR sequence The diffusion-weighted imaging is of good quality. No evidence of acute infarction. There is no acute infarct. No evidence of intracranial mass. No chronic intracranial blood products. No extra-axial fluid collection. No midline shift. Vascular: Expected proximal arterial flow voids. Skull and upper cervical spine: No focal marrow lesion. Nonspecific reversal of the expected cervical lordosis. Cervical spondylosis. Sinuses/Orbits: Visualized orbits show no acute finding. Paranasal sinus mucosal thickening. Most notably, there is moderate bilateral ethmoid sinus mucosal thickening. Moderate-sized left maxillary sinus mucous retention cyst. Other: 11 mm T1 hyperintense lesion within the midline  nasopharynx likely reflecting a Tornwaldt cyst. IMPRESSION: Significantly motion degraded examination as described. The diffusion-weighted imaging is of good quality. No evidence of acute infarction. Mild parenchymal atrophy and chronic small vessel ischemic disease. Paranasal sinus disease as described. Electronically Signed   By: Kellie Simmering DO   On: 08/01/2020 21:31    Scheduled Meds: . anastrozole  1 mg Oral Daily  . vitamin C  500 mg Oral Daily  . aspirin EC  81 mg Oral Daily  . Chlorhexidine Gluconate Cloth  6 each Topical Daily  . enoxaparin (LOVENOX) injection  60 mg Subcutaneous Q24H  . folic acid  1 mg Oral Daily  . gabapentin  100 mg Oral BID  . gabapentin  300 mg Oral QHS  . multivitamin with minerals  1 tablet Oral Daily  . nystatin   Topical TID  . sodium chloride flush  10-40 mL Intracatheter Q12H  . thiamine  100 mg Oral Daily   Or  . thiamine  100 mg Intravenous Daily  . zinc sulfate  220 mg Oral Daily  Continuous Infusions: . sodium chloride    . famotidine (PEPCID) IV     PRN Meds: sodium chloride, acetaminophen, albuterol, chlorpheniramine-HYDROcodone, diphenhydrAMINE, EPINEPHrine, famotidine (PEPCID) IV, guaiFENesin-dextromethorphan, labetalol, LORazepam **OR** LORazepam, methylPREDNISolone (SOLU-MEDROL) injection, ondansetron **OR** ondansetron (ZOFRAN) IV, sodium chloride flush  Time spent: 35 minutes  Author: Berle Mull, MD [facility] 08/02/2020 7:19 PM  To reach On-call, see care teams to locate the attending and reach out via [email or web address]. Between 7PM-7AM, please contact night-coverage If you still have difficulty reaching the attending provider, please page the [facility] (Director on Call) for [facility] on amion for assistance.

## 2020-08-03 LAB — COMPREHENSIVE METABOLIC PANEL
ALT: 21 U/L (ref 0–44)
AST: 39 U/L (ref 15–41)
Albumin: 2.6 g/dL — ABNORMAL LOW (ref 3.5–5.0)
Alkaline Phosphatase: 40 U/L (ref 38–126)
Anion gap: 10 (ref 5–15)
BUN: 17 mg/dL (ref 8–23)
CO2: 22 mmol/L (ref 22–32)
Calcium: 8.1 mg/dL — ABNORMAL LOW (ref 8.9–10.3)
Chloride: 105 mmol/L (ref 98–111)
Creatinine, Ser: 1.32 mg/dL — ABNORMAL HIGH (ref 0.44–1.00)
GFR, Estimated: 45 mL/min — ABNORMAL LOW (ref >60)
Glucose, Bld: 93 mg/dL (ref 70–99)
Potassium: 3.2 mmol/L — ABNORMAL LOW (ref 3.5–5.1)
Sodium: 137 mmol/L (ref 135–145)
Total Bilirubin: 0.7 mg/dL (ref 0.3–1.2)
Total Protein: 6.1 g/dL — ABNORMAL LOW (ref 6.5–8.1)

## 2020-08-03 LAB — CBC WITH DIFFERENTIAL/PLATELET
Abs Immature Granulocytes: 0.03 10*3/uL (ref 0.00–0.07)
Basophils Absolute: 0 10*3/uL (ref 0.0–0.1)
Basophils Relative: 0 %
Eosinophils Absolute: 0.1 10*3/uL (ref 0.0–0.5)
Eosinophils Relative: 5 %
HCT: 26.8 % — ABNORMAL LOW (ref 36.0–46.0)
Hemoglobin: 8.6 g/dL — ABNORMAL LOW (ref 12.0–15.0)
Immature Granulocytes: 1 %
Lymphocytes Relative: 46 %
Lymphs Abs: 1.1 10*3/uL (ref 0.7–4.0)
MCH: 31.2 pg (ref 26.0–34.0)
MCHC: 32.1 g/dL (ref 30.0–36.0)
MCV: 97.1 fL (ref 80.0–100.0)
Monocytes Absolute: 0.3 10*3/uL (ref 0.1–1.0)
Monocytes Relative: 11 %
Neutro Abs: 0.9 10*3/uL — ABNORMAL LOW (ref 1.7–7.7)
Neutrophils Relative %: 37 %
Platelets: 164 10*3/uL (ref 150–400)
RBC: 2.76 MIL/uL — ABNORMAL LOW (ref 3.87–5.11)
RDW: 13.5 % (ref 11.5–15.5)
WBC: 2.4 10*3/uL — ABNORMAL LOW (ref 4.0–10.5)
nRBC: 0 % (ref 0.0–0.2)

## 2020-08-03 LAB — C-REACTIVE PROTEIN: CRP: 5.8 mg/dL — ABNORMAL HIGH (ref <1.0)

## 2020-08-03 LAB — D-DIMER, QUANTITATIVE: D-Dimer, Quant: 1.84 ug/mL-FEU — ABNORMAL HIGH (ref 0.00–0.50)

## 2020-08-03 MED ORDER — POTASSIUM CHLORIDE CRYS ER 20 MEQ PO TBCR
40.0000 meq | EXTENDED_RELEASE_TABLET | Freq: Once | ORAL | Status: AC
  Administered 2020-08-03: 40 meq via ORAL
  Filled 2020-08-03: qty 2

## 2020-08-03 MED ORDER — FOLIC ACID 1 MG PO TABS
1.0000 mg | ORAL_TABLET | Freq: Every day | ORAL | 0 refills | Status: DC
Start: 2020-08-03 — End: 2022-02-05

## 2020-08-03 MED ORDER — HEPARIN SOD (PORK) LOCK FLUSH 100 UNIT/ML IV SOLN: 500.0000 [IU] | INTRAVENOUS | Status: DC

## 2020-08-03 MED ORDER — ZINC SULFATE 220 (50 ZN) MG PO CAPS
220.0000 mg | ORAL_CAPSULE | Freq: Every day | ORAL | 0 refills | Status: DC
Start: 2020-08-03 — End: 2020-10-16

## 2020-08-03 MED ORDER — HEPARIN SOD (PORK) LOCK FLUSH 100 UNIT/ML IV SOLN
500.0000 [IU] | INTRAVENOUS | Status: DC | PRN
  Administered 2020-08-03: 500 [IU]

## 2020-08-03 MED ORDER — ASCORBIC ACID 500 MG PO TABS
500.0000 mg | ORAL_TABLET | Freq: Every day | ORAL | 0 refills | Status: DC
Start: 2020-08-03 — End: 2021-09-12

## 2020-08-03 MED ORDER — POTASSIUM CHLORIDE 10 MEQ/50ML IV SOLN: 10.0000 meq | INTRAVENOUS | Status: DC

## 2020-08-03 NOTE — Progress Notes (Signed)
Pt being discharged home. Discharge instructions and medication education provided to pt.

## 2020-08-03 NOTE — Progress Notes (Signed)
PT Cancellation Note  Patient Details Name: [name] MRN: [id number] DOB: [birth year]   Cancelled Treatment:    Reason Eval/Treat Not Completed: Patient declined, no reason specified. Pt scheduled to d/c shortly.  Pt declines PT at this time. Nurse states pt was getting up yesterday.  Will sign off.   Galen Manila 08/03/2020, 9:59 AM

## 2020-08-04 ENCOUNTER — Telehealth: Payer: Self-pay

## 2020-08-04 NOTE — Telephone Encounter (Signed)
Transition Care Management Follow-up Telephone Call  Date of discharge and from where: 08/03/2020, [facility]  How have you been since you were released from the hospital? She said she is feeling "great" just laying around.   Any questions or concerns? No   She understands that she is to remain quarantined at home until 12/23.2021,   She said that she remains isolated from those that come into her house and if someone comes into the house, they wear a mask.   Items Reviewed:  Did the pt receive and understand the discharge instructions provided? Yes   Medications obtained and verified? she said that her son needs to pick up the vitamin C, folic acid and zinc sulfate today.  she has all of the other medications and did not have any questions about the med regime.   Other? No   Any new allergies since your discharge? No   Do you have support at home? Yes  - she has grandchildren that check on her and one of them stays with her at night.   Home Care and Equipment/Supplies: Were home health services ordered? no If so, what is the name of the agency? n/a Has the agency set up a time to come to the patient's home?n/a Were any new equipment or medical supplies ordered?  No What is the name of the medical supply agency? n/a Were you able to get the supplies/equipment? n/a Do you have any questions related to the use of the equipment or supplies? No, n/a   Functional Questionnaire: (I = Independent and D = Dependent) ADLs: independent.  Using walker with ambulation  Follow up appointments reviewed:   PCP Hospital f/u appt confirmed? Yes , Dr Wynetta Emery 08/12/2020 televisit.    [facility] f/u appt confirmed? next appointment with oncology - 11/11/2020.   Are transportation arrangements needed? No   If their condition worsens, is the pt aware to call PCP or go to the Emergency Dept.? Yes  Was the patient provided with contact information for the PCP's office or ED?  She has the clinic phone number  Was to pt encouraged to call back with questions or concerns? yes

## 2020-08-05 NOTE — Discharge Summary (Signed)
[facility] Discharge Summary   Patient: [name] XAJ:[id number]  PCP: [name], MD  Date of admission: 07/31/2020   Date of discharge: 08/03/2020      Discharge Diagnoses:   Principal Problem:   Acute metabolic encephalopathy Active Problems:   Essential hypertension   Alcohol dependence ([place])   Malignant neoplasm of upper-outer quadrant of left breast in female, estrogen receptor positive ([place])   Anemia of chronic disease   Chronic kidney disease, stage 3a ([place])   COVID-19 virus infection   COVID-19   Admitted From: Home Disposition:  Home   Recommendations for Outpatient Follow-up:  1. PCP: Follow-up with PCP in 1 week 2. Follow up LABS/TEST: None   Follow-up Information    Fulp, Cammie, MD. Schedule an appointment as soon as possible for a visit in 1 week(s).   Specialty: Family Medicine Contact information: [place] [address] [phone]              Diet recommendation: Cardiac diet  Activity: The patient is advised to gradually reintroduce usual activities, as tolerated  Discharge Condition: stable  Code Status: Full code   History of present illness: As per the H and P dictated on admission, "[name] is a 64 y.o. female with medical history significant of EtOH abuse, HTN, breast CA s/p definitive surgery, radiation, and on anastrazole with no known recurrence, morbid obesity.  Pt presents to ED with c/o 1 week history of body aches, fatigue, low-grade fever, mild SOB, and intermittent confusion.  Brother who his her primary caregiver was recently diagnosed with COVID-19.  Patient is fully vaccinated to [place] and had booster shot in Oct.  Pt reports difficulty with word finding for past 1 day.  Pt has confusion: states current year 1957, knows shes in hospital in [place], knows month, oriented to self.  Pt states she "does not feel good".  Not really able to give much more in the way of  history."  Hospital Course:  Summary of her active problems in the hospital is as following. 1.  Acute encephalopathy likely metabolic in the setting of fever Expressive aphasia Currently speech improving. No focal deficit exam wise as well. MRI brain negative for any acute stroke as well. Mentation improving.  Back to baseline per family.  Tolerating diet.  Ambulating in the room.  2.  COVID-19 infection No hypoxia  Stable inflammatory markers. Initially was started on remdesivir although given lack of hypoxia currently we'll discontinue remdesivir. Discussed with the pharmacy and maB therapy initiated. Holding steroids given lack of hypoxia.  3.  Hypokalemia Hypomagnesemia Replaced  4.  Chronic anemia Likely nutritional deficiency. We'll monitor.  5.  Chronic kidney disease stage IIIb. Renal function stable.  Monitor.  6.  Essential hypertension Currently holding blood pressure medication.  Monitor.  7.  Breast cancer. At risk for metastasis to brain. MRI brain without contrast negative. Monitor.  8.  Alcohol abuse. On CIWA.  9.  Morbid obesity Body mass index is 52.44 kg/m.  Placing the patient in a poor outcome in the setting of COVID-19 pneumonia.  Monitor.  Patient was ambulatory without any assistance. On the day of the discharge the patient's vitals were stable, and no other acute medical condition were reported by patient. The patient was felt safe to be discharge at Home with no therapy needed on discharge.  Consultants: none Procedures: none  Discharge Exam: General: Appear in no distress, no Rash; Oral Mucosa Clear, moist. no Abnormal Neck  Mass Or lumps, Conjunctiva normal  Cardiovascular: S1 and S2 Present, no Murmur Respiratory: good respiratory effort, Bilateral Air entry present and CTA, no Crackles, no wheezes Abdomen: Bowel Sound present, Soft and no tenderness Extremities: no Pedal edema Neurology: alert and oriented to time, place,  and person affect appropriate. no new focal deficit  Filed Weights   08/01/20 0200  Weight: 125.9 kg   Vitals:   08/03/20 0401 08/03/20 1000  BP: (!) 148/85 (!) 132/111  Pulse: 75   Resp: (!) 23 (!) 22  Temp: 98.5 F (36.9 C) 97.8 F (36.6 C)  SpO2: 96% 99%    DISCHARGE MEDICATION: Allergies as of 08/03/2020      Reactions   Amlodipine Swelling   BLE edema      Medication List    TAKE these medications   anastrozole 1 MG tablet Commonly known as: ARIMIDEX Take 1 tablet (1 mg total) by mouth daily.   ascorbic acid 500 MG tablet Commonly known as: VITAMIN C Take 1 tablet (500 mg total) by mouth daily.   aspirin 81 MG EC tablet Take 1 tablet (81 mg total) by mouth daily. Swallow whole.   Blood Pressure Monitor Devi Use as instructed by provider to check bloos pressure daily. ICD: I10   feeding supplement (KATE FARMS STANDARD 1.4) Liqd liquid Take 325 mLs by mouth 2 (two) times daily between meals.   folic acid 1 MG tablet Commonly known as: FOLVITE Take 1 tablet (1 mg total) by mouth daily.   furosemide 20 MG tablet Commonly known as: LASIX Take 1 tablet (20 mg total) by mouth daily.   gabapentin 300 MG capsule Commonly known as: NEURONTIN Take 1 capsule (300 mg total) by mouth at bedtime.   gabapentin 100 MG capsule Commonly known as: NEURONTIN Take 1 capsule (100 mg total) by mouth 2 (two) times daily.   lidocaine-prilocaine cream Commonly known as: EMLA Apply 1 application topically as needed. What changed: reasons to take this   Magnesium Oxide 400 MG Caps Take 1 capsule (400 mg total) by mouth daily.   multivitamin with minerals Tabs tablet Take 1 tablet by mouth daily.   nystatin powder Commonly known as: MYCOSTATIN/NYSTOP Apply topically 3 (three) times daily. Apply to under panus.   thiamine 100 MG tablet Take 1 tablet (100 mg total) by mouth daily.   zinc sulfate 220 (50 Zn) MG capsule Take 1 capsule (220 mg total) by mouth daily.       Allergies  Allergen Reactions  . Amlodipine Swelling    BLE edema   Discharge Instructions    Diet - low sodium heart healthy   Complete by: As directed    Increase activity slowly   Complete by: As directed       The results of significant diagnostics from this hospitalization (including imaging, microbiology, ancillary and laboratory) are listed below for reference.    Significant Diagnostic Studies: CT Head Wo Contrast  Result Date: 07/31/2020 CLINICAL DATA:  Malaise, body aches and shortness of breath. EXAM: CT HEAD WITHOUT CONTRAST TECHNIQUE: Contiguous axial images were obtained from the base of the skull through the vertex without intravenous contrast. COMPARISON:  None. FINDINGS: Brain: No evidence of acute infarction, hemorrhage, hydrocephalus, extra-axial collection or mass lesion/mass effect. Vascular: No hyperdense vessel or unexpected calcification. Skull: Intact.  No focal lesion. Sinuses/Orbits: Mucous retention cyst or polyp left maxillary sinus noted. Other: None. IMPRESSION: No acute abnormality. Mucous retention cyst or polyp left maxillary sinus. Electronically Signed  By: Inge Rise M.D.   On: 07/31/2020 19:35   MR BRAIN WO CONTRAST  Result Date: 08/01/2020 CLINICAL DATA:  Transient ischemic attack (TIA). Additional provided: Confusion, COVID positive. EXAM: MRI HEAD WITHOUT CONTRAST TECHNIQUE: Multiplanar, multiecho pulse sequences of the brain and surrounding structures were obtained without intravenous contrast. COMPARISON:  Head CT 07/31/2020.  Head CT 11/08/2007. FINDINGS: Brain: The examination is motion degraded. Most notably, there is severe motion degradation of the axial T2/FLAIR sequence, severe motion degradation of the axial T1 weighted sequence and severe motion degradation of the coronal T2 weighted sequence. Mild cerebral and cerebellar atrophy. Mild multifocal T2/FLAIR hyperintensity within the cerebral white matter is nonspecific, but  compatible chronic small vessel ischemic disease. This is poorly assessed due to the degree of motion degradation on the T2/FLAIR sequence The diffusion-weighted imaging is of good quality. No evidence of acute infarction. There is no acute infarct. No evidence of intracranial mass. No chronic intracranial blood products. No extra-axial fluid collection. No midline shift. Vascular: Expected proximal arterial flow voids. Skull and upper cervical spine: No focal marrow lesion. Nonspecific reversal of the expected cervical lordosis. Cervical spondylosis. Sinuses/Orbits: Visualized orbits show no acute finding. Paranasal sinus mucosal thickening. Most notably, there is moderate bilateral ethmoid sinus mucosal thickening. Moderate-sized left maxillary sinus mucous retention cyst. Other: 11 mm T1 hyperintense lesion within the midline nasopharynx likely reflecting a Tornwaldt cyst. IMPRESSION: Significantly motion degraded examination as described. The diffusion-weighted imaging is of good quality. No evidence of acute infarction. Mild parenchymal atrophy and chronic small vessel ischemic disease. Paranasal sinus disease as described. Electronically Signed   By: Kellie Simmering DO   On: 08/01/2020 21:31   DG Chest Port 1 View  Result Date: 07/31/2020 CLINICAL DATA:  COVID symptoms for 1 week, initial encounter EXAM: PORTABLE CHEST 1 VIEW COMPARISON:  05/31/2020 FINDINGS: Cardiac shadow is mildly enlarged but stable. Aortic calcifications are again seen. Right chest wall port is noted in satisfactory position. Mild central vascular congestion is noted without interstitial edema. No focal infiltrate is seen. Postsurgical changes on the left are noted. IMPRESSION: Mild vascular congestion without parenchymal edema. Electronically Signed   By: Inez Catalina M.D.   On: 07/31/2020 19:54    Microbiology: Recent Results (from the past 240 hour(s))  Resp Panel by RT-PCR (Flu A&B, Covid) Nasopharyngeal Swab     Status:  Abnormal   Collection Time: 07/31/20  9:05 PM   Specimen: Nasopharyngeal Swab; Nasopharyngeal(NP) swabs in vial transport medium  Result Value Ref Range Status   SARS Coronavirus 2 by RT PCR POSITIVE (A) NEGATIVE Final    Comment: CRITICAL RESULT CALLED TO, READ BACK BY AND VERIFIED WITH: RN [facility] AT 3235 08/01/20 CRU[place]ANK A (NOTE) SARS-CoV-2 target nucleic acids are DETECTED.  The SARS-CoV-2 RNA is generally detectable in upper respiratory specimens during the acute phase of infection. Positive results are indicative of the presence of the identified virus, but do not rule out bacterial infection or co-infection with other pathogens not detected by the test. Clinical correlation with patient history and other diagnostic information is necessary to determine patient infection status. The expected result is Negative.  Fact Sheet for Patients: EntrepreneurPulse.com.au  Fact Sheet for Healthcare Providers: IncredibleEmployment.be  This test is not yet approved or cleared by the [place] FDA and  has been authorized for detection and/or diagnosis of SARS-CoV-2 by FDA under an Emergency Use Authorization (EUA).  This EUA will remain in effect (meaning  this test can be used) for  the duration of  the COVID-19 declaration under Section 564(b)(1) of the Act, 21 U.S.C. section 360bbb-3(b)(1), unless the authorization is terminated or revoked sooner.     Influenza A by PCR NEGATIVE NEGATIVE Final   Influenza B by PCR NEGATIVE NEGATIVE Final    Comment: (NOTE) The Xpert Xpress SARS-CoV-2/FLU/RSV plus assay is intended as an aid in the diagnosis of influenza from Nasopharyngeal swab specimens and should not be used as a sole basis for treatment. Nasal washings and aspirates are unacceptable for Xpert Xpress SARS-CoV-2/FLU/RSV testing.  Fact Sheet for Patients: EntrepreneurPulse.com.au  Fact Sheet for Healthcare  Providers: IncredibleEmployment.be  This test is not yet approved or cleared by the [place] FDA and has been authorized for detection and/or diagnosis of SARS-CoV-2 by FDA under an Emergency Use Authorization (EUA). This EUA will remain in effect (meaning this test can be used) for the duration of the COVID-19 declaration under Section 564(b)(1) of the Act, 21 U.S.C. section 360bbb-3(b)(1), unless the authorization is terminated or revoked.  Performed at [facility], [place] [address], [place], [place] [address]   Blood Culture (routine x 2)     Status: None (Preliminary result)   Collection Time: 07/31/20 11:03 PM   Specimen: BLOOD  Result Value Ref Range Status   Specimen Description   Final    BLOOD RIGHT PORTA CATH Performed at [facility] [address] [place], [place] [address]    Special Requests   Final    BOTTLES DRAWN AEROBIC AND ANAEROBIC Blood Culture results may not be optimal due to an inadequate volume of blood received in culture bottles Performed at [facility] [address], [place], [place] [address]    Culture   Final    NO GROWTH 4 DAYS Performed at [facility], [place] [address], [place], [place] [address]    Report Status PENDING  Incomplete  Blood Culture (routine x 2)     Status: None (Preliminary result)   Collection Time: 08/01/20  1:39 AM   Specimen: BLOOD  Result Value Ref Range Status   Specimen Description   Final    BLOOD BLOOD RIGHT FOREARM Performed at [facility] [address] [place] [address], [place], [place] [address]    Special Requests   Final    BOTTLES DRAWN AEROBIC AND ANAEROBIC Blood Culture results may not be optimal due to an excessive volume of blood received in culture bottles Performed at [facility] [address], [place], [place] [address]    Culture   Final    NO GROWTH 4 DAYS Performed at [facility], [place] [address], [place],  [address]    Report Status PENDING  Incomplete     Labs: CBC: Recent Labs  Lab 07/31/20 2303 08/02/20 0500 08/03/20 0440  WBC 4.8 2.3* 2.4*  NEUTROABS 3.4 0.7* 0.9*  HGB 8.8* 8.3* 8.6*  HCT 26.7* 25.3* 26.8*  MCV 95.4 95.8 97.1  PLT 155 145* 867   Basic Metabolic Panel: Recent Labs  Lab 07/31/20 2303 08/01/20 0229 08/01/20 0925 08/02/20 0500 08/03/20 0440  NA 136  --  138 134* 137  K 2.9*  --  3.2* 2.9* 3.2*  CL 103  --  108 100 105  CO2 19*  --  18* 22 22  GLUCOSE 80  --  81 109* 93  BUN 22  --  19 18 17   CREATININE 1.55*  --  1.39* 1.31* 1.32*  CALCIUM 8.1*  --  7.1* 7.6* 8.1*  MG  --  1.1* 1.2*  --   --   PHOS  --  3.6  --   --   --    Liver Function Tests: Recent Labs  Lab 07/31/20 2303 08/02/20 0500 08/03/20 0440  AST 58* 40 39  ALT 29 23 21   ALKPHOS 40 40 40  BILITOT 0.7 0.5 0.7  PROT 7.0 6.2* 6.1*  ALBUMIN 3.1* 2.6* 2.6*   CBG: No results for input(s): GLUCAP in the last 168 hours.  Time spent: 35 minutes  Signed:  Berle Mull  [facility] 08/03/2020

## 2020-08-06 ENCOUNTER — Other Ambulatory Visit: Payer: Self-pay

## 2020-08-06 LAB — CULTURE, BLOOD (ROUTINE X 2)
Culture: NO GROWTH
Culture: NO GROWTH

## 2020-08-06 NOTE — Patient Instructions (Signed)
Visit Information  [name] was given information about Medicaid Managed Care team care coordination services as a part of their [place] Medicaid benefit. [name] verbally consented to engagement with the [facility] Managed Care team.   For questions related to your [facility], please call: [phone] or visit the homepage here: [email or web address]  If you would like to schedule transportation through your [facility], please call the following number at least 2 days in advance of your appointment: [phone]  Goals Addressed            This Visit's Progress   . Medication Management       Medicaid Managed Care CARE PLAN ENTRY (see longitudinal plan of care for additional care plan information)  Current Barriers:  . Social, financial, community barriers:  . Patient with complex health conditions multiple comorbidities including Covid-19, HTN . Self-manages medications. Does not use a pill box or other adherence strategies . Pharmacy  [id number] - [place], [place] [address] Phone: [phone] Fax: [phone]     Pharmacist Clinical Goal(s):  Marland Kitchen Over the next 365 days, patient will work with PharmD and provider towards optimized medication management  Interventions: . Comprehensive medication review performed; medication list updated in electronic medical record . Inter-disciplinary care team collaboration (see longitudinal plan of care)  Patient is on very few legend medications and, other than recovering from Covid, has no needs at this time   Patient Self Care Activities:  . Patient will take medications as prescribed . Patient will focus on improved adherence by January   Initial goal documentation  [name], Pharm.D., Managed  Medicaid Pharmacist - [phone]        There are no care plans to display for this patient.   Please see education materials related to Covid provided as Advertising account planner.   Patient verbalizes understanding of instructions provided today.   The Managed Medicaid care management team will reach out to the patient again over the next 365 days.   Lane Hacker, [facility]  COVID-19 COVID-19 is a respiratory infection that is caused by a virus called severe acute respiratory syndrome coronavirus 2 (SARS-CoV-2). The disease is also known as coronavirus disease or novel coronavirus. In some people, the virus may not cause any symptoms. In others, it may cause a serious infection. The infection can get worse quickly and can lead to complications, such as:  Pneumonia, or infection of the lungs.  Acute respiratory distress syndrome or ARDS. This is a condition in which fluid build-up in the lungs prevents the lungs from filling with air and passing oxygen into the blood.  Acute respiratory failure. This is a condition in which there is not enough oxygen passing from the lungs to the body or when carbon dioxide is not passing from the lungs out of the body.  Sepsis or septic shock. This is a serious bodily reaction to an infection.  Blood clotting problems.  Secondary infections due to bacteria or fungus.  Organ failure. This is when your body's organs stop working. The virus that causes COVID-19 is contagious. This means that it can spread from person to person through droplets from coughs and sneezes (respiratory secretions). What are the causes? This illness is caused by a virus. You may catch the virus by:  Breathing in droplets from an infected person. Droplets can be spread by a  person breathing, speaking, singing, coughing, or sneezing.  Touching something, like a table or a doorknob, that was exposed to the virus (contaminated) and then touching your mouth, nose, or eyes. What  increases the risk? Risk for infection You are more likely to be infected with this virus if you:  Are within 6 feet (2 meters) of a person with COVID-19.  Provide care for or live with a person who is infected with COVID-19.  Spend time in crowded indoor spaces or live in shared housing. Risk for serious illness You are more likely to become seriously ill from the virus if you:  Are 51 years of age or older. The higher your age, the more you are at risk for serious illness.  Live in a nursing home or long-term care facility.  Have cancer.  Have a long-term (chronic) disease such as: ? Chronic lung disease, including chronic obstructive pulmonary disease or asthma. ? A long-term disease that lowers your body's ability to fight infection (immunocompromised). ? Heart disease, including heart failure, a condition in which the arteries that lead to the heart become narrow or blocked (coronary artery disease), a disease which makes the heart muscle thick, weak, or stiff (cardiomyopathy). ? Diabetes. ? Chronic kidney disease. ? Sickle cell disease, a condition in which red blood cells have an abnormal "sickle" shape. ? Liver disease.  Are obese. What are the signs or symptoms? Symptoms of this condition can range from mild to severe. Symptoms may appear any time from 2 to 14 days after being exposed to the virus. They include:  A fever or chills.  A cough.  Difficulty breathing.  Headaches, body aches, or muscle aches.  Runny or stuffy (congested) nose.  A sore throat.  New loss of taste or smell. Some people may also have stomach problems, such as nausea, vomiting, or diarrhea. Other people may not have any symptoms of COVID-19. How is this diagnosed? This condition may be diagnosed based on:  Your signs and symptoms, especially if: ? You live in an area with a COVID-19 outbreak. ? You recently traveled to or from an area where the virus is common. ? You provide care  for or live with a person who was diagnosed with COVID-19. ? You were exposed to a person who was diagnosed with COVID-19.  A physical exam.  Lab tests, which may include: ? Taking a sample of fluid from the back of your nose and throat (nasopharyngeal fluid), your nose, or your throat using a swab. ? A sample of mucus from your lungs (sputum). ? Blood tests.  Imaging tests, which may include, X-rays, CT scan, or ultrasound. How is this treated? At present, there is no medicine to treat COVID-19. Medicines that treat other diseases are being used on a trial basis to see if they are effective against COVID-19. Your health care provider will talk with you about ways to treat your symptoms. For most people, the infection is mild and can be managed at home with rest, fluids, and over-the-counter medicines. Treatment for a serious infection usually takes places in a hospital intensive care unit (ICU). It may include one or more of the following treatments. These treatments are given until your symptoms improve.  Receiving fluids and medicines through an IV.  Supplemental oxygen. Extra oxygen is given through a tube in the nose, a face mask, or a hood.  Positioning you to lie on your stomach (prone position). This makes it easier for oxygen to get into the  lungs.  Continuous positive airway pressure (CPAP) or bi-level positive airway pressure (BPAP) machine. This treatment uses mild air pressure to keep the airways open. A tube that is connected to a motor delivers oxygen to the body.  Ventilator. This treatment moves air into and out of the lungs by using a tube that is placed in your windpipe.  Tracheostomy. This is a procedure to create a hole in the neck so that a breathing tube can be inserted.  Extracorporeal membrane oxygenation (ECMO). This procedure gives the lungs a chance to recover by taking over the functions of the heart and lungs. It supplies oxygen to the body and removes carbon  dioxide. Follow these instructions at home: Lifestyle  If you are sick, stay home except to get medical care. Your health care provider will tell you how long to stay home. Call your health care provider before you go for medical care.  Rest at home as told by your health care provider.  Do not use any products that contain nicotine or tobacco, such as cigarettes, e-cigarettes, and chewing tobacco. If you need help quitting, ask your health care provider.  Return to your normal activities as told by your health care provider. Ask your health care provider what activities are safe for you. General instructions  Take over-the-counter and prescription medicines only as told by your health care provider.  Drink enough fluid to keep your urine pale yellow.  Keep all follow-up visits as told by your health care provider. This is important. How is this prevented?  There is no vaccine to help prevent COVID-19 infection. However, there are steps you can take to protect yourself and others from this virus. To protect yourself:   Do not travel to areas where COVID-19 is a risk. The areas where COVID-19 is reported change often. To identify high-risk areas and travel restrictions, check the CDC travel website: FatFares.com.br  If you live in, or must travel to, an area where COVID-19 is a risk, take precautions to avoid infection. ? Stay away from people who are sick. ? Wash your hands often with soap and water for 20 seconds. If soap and water are not available, use an alcohol-based hand sanitizer. ? Avoid touching your mouth, face, eyes, or nose. ? Avoid going out in public, follow guidance from your state and local health authorities. ? If you must go out in public, wear a cloth face covering or face mask. Make sure your mask covers your nose and mouth. ? Avoid crowded indoor spaces. Stay at least 6 feet (2 meters) away from others. ? Disinfect objects and surfaces that are  frequently touched every day. This may include:  Counters and tables.  Doorknobs and light switches.  Sinks and faucets.  Electronics, such as phones, remote controls, keyboards, computers, and tablets. To protect others: If you have symptoms of COVID-19, take steps to prevent the virus from spreading to others.  If you think you have a COVID-19 infection, contact your health care provider right away. Tell your health care team that you think you may have a COVID-19 infection.  Stay home. Leave your house only to seek medical care. Do not use public transport.  Do not travel while you are sick.  Wash your hands often with soap and water for 20 seconds. If soap and water are not available, use alcohol-based hand sanitizer.  Stay away from other members of your household. Let healthy household members care for children and pets, if possible. If you  have to care for children or pets, wash your hands often and wear a mask. If possible, stay in your own room, separate from others. Use a different bathroom.  Make sure that all people in your household wash their hands well and often.  Cough or sneeze into a tissue or your sleeve or elbow. Do not cough or sneeze into your hand or into the air.  Wear a cloth face covering or face mask. Make sure your mask covers your nose and mouth. Where to find more information  Centers for Disease Control and Prevention: PurpleGadgets.be  World Health Organization: https://www.castaneda.info/ Contact a health care provider if:  You live in or have traveled to an area where COVID-19 is a risk and you have symptoms of the infection.  You have had contact with someone who has COVID-19 and you have symptoms of the infection. Get help right away if:  You have trouble breathing.  You have pain or pressure in your chest.  You have confusion.  You have bluish lips and fingernails.  You have difficulty waking  from sleep.  You have symptoms that get worse. These symptoms may represent a serious problem that is an emergency. Do not wait to see if the symptoms will go away. Get medical help right away. Call your local emergency services (911 in the [place] Do not drive yourself to the hospital. Let the emergency medical personnel know if you think you have COVID-19. Summary  COVID-19 is a respiratory infection that is caused by a virus. It is also known as coronavirus disease or novel coronavirus. It can cause serious infections, such as pneumonia, acute respiratory distress syndrome, acute respiratory failure, or sepsis.  The virus that causes COVID-19 is contagious. This means that it can spread from person to person through droplets from breathing, speaking, singing, coughing, or sneezing.  You are more likely to develop a serious illness if you are 71 years of age or older, have a weak immune system, live in a nursing home, or have chronic disease.  There is no medicine to treat COVID-19. Your health care provider will talk with you about ways to treat your symptoms.  Take steps to protect yourself and others from infection. Wash your hands often and disinfect objects and surfaces that are frequently touched every day. Stay away from people who are sick and wear a mask if you are sick. This information is not intended to replace advice given to you by your health care provider. Make sure you discuss any questions you have with your health care provider. Document Revised: 06/15/2019 Document Reviewed: 09/21/2018 Elsevier Patient Education  [place].

## 2020-08-07 ENCOUNTER — Other Ambulatory Visit: Payer: Self-pay

## 2020-08-07 NOTE — Patient Instructions (Signed)
Visit Information  [name] was given information about Medicaid Managed Care team care coordination services as a part of their [place] Medicaid benefit. [name] verbally consented to engagement with the [facility] Managed Care team.   For questions related to your [facility], please call: [phone] or visit the homepage here: [email or web address]  If you would like to schedule transportation through your [facility], please call the following number at least 2 days in advance of your appointment: [phone]    The Managed Medicaid care management team will reach out to the patient again over the next 30-45  days.   [name]

## 2020-08-07 NOTE — Patient Outreach (Signed)
Care Coordination- Social Work  08/07/2020  [name] 1955-09-20 [id number]  Subjective:    [name] is an 64 y.o. year old female who is a primary patient of Fulp, Ander Gaster, MD.    Ms. [name] was given information about Medicaid Managed Care team care coordination services today. [name] agreed to services and verbal consent obtained  Review of patient status, laboratory and other test data was performed as part of evaluation for provision of services.  SDOH:   SDOH Screenings   Alcohol Screen: Not on file  Depression (PHQ2-9): Low Risk   . PHQ-2 Score: 2  Financial Resource Strain: Low Risk   . Difficulty of Paying Living Expenses: Not hard at all  Food Insecurity: No Food Insecurity  . Worried About Charity fundraiser in the Last Year: Never true  . Ran Out of Food in the Last Year: Never true  Housing: Not on file  Physical Activity: Not on file  Social Connections: Not on file  Stress: Not on file  Tobacco Use: Low Risk   . Smoking Tobacco Use: Never Smoker  . Smokeless Tobacco Use: Never Used  Transportation Needs: No Transportation Needs  . Lack of Transportation (Medical): No  . Lack of Transportation (Non-Medical): No     Objective:    Medications:  Medications Reviewed Today    Reviewed by [name], [facility] (Pharmacist) on 08/06/20 at [place] List Status: <None>  Medication Order Taking? Sig Documenting Provider Last Dose Status Informant  anastrozole (ARIMIDEX) 1 MG tablet [id number] Yes Take 1 tablet (1 mg total) by mouth daily. Magrinat, Sue Dad, MD Taking Active Family Member  ascorbic acid (VITAMIN C) 500 MG tablet [id number] Yes Take 1 tablet (500 mg total) by mouth daily. Sue Hamman, MD Taking Active   aspirin EC 81 MG EC tablet [id number] Yes Take 1 tablet (81 mg total) by mouth daily. Swallow whole. [name], MD Taking Active Family Member  Blood Pressure Monitor DEVI [id number]  Use as instructed by provider to  check bloos pressure daily. ICD: I10  Patient not taking: Reported on 07/30/2020   Sue Blackbird, MD  Active Family Member  folic acid (FOLVITE) 1 MG tablet [id number] Yes Take 1 tablet (1 mg total) by mouth daily. Sue Hamman, MD Taking Active   furosemide (LASIX) 20 MG tablet [id number] Yes Take 1 tablet (20 mg total) by mouth daily. Sue Lean, MD Taking Active Family Member           Med Note Orma Flaming Aug 06, 2020  9:13 AM) Taking PRN but has needed daily  gabapentin (NEURONTIN) 100 MG capsule [id number] Yes Take 1 capsule (100 mg total) by mouth 2 (two) times daily. Magrinat, Sue Dad, MD Taking Active Family Member  gabapentin (NEURONTIN) 300 MG capsule [id number] Yes Take 1 capsule (300 mg total) by mouth at bedtime. Magrinat, Sue Dad, MD Taking Active Family Member  lidocaine-prilocaine (EMLA) cream [id number] Yes Apply 1 application topically as needed.  Patient taking differently: Apply 1 application topically as needed (port).    Magrinat, Sue Dad, MD Taking Active Family Member  Magnesium Oxide 400 MG CAPS [id number] No Take 1 capsule (400 mg total) by mouth daily.  Patient not taking: Reported on 08/06/2020   Sue Lean, MD Not Taking Consider Medication Status and Discontinue Family Member  Multiple Vitamin (MULTIVITAMIN WITH MINERALS) TABS tablet [id number] Yes Take 1 tablet by mouth daily. Sue Jansky, MD  Taking Active Family Member  Nutritional Supplements (FEEDING SUPPLEMENT, KATE FARMS STANDARD 1.4,) LIQD liquid [id number] No Take 325 mLs by mouth 2 (two) times daily between meals.  Patient not taking: Reported on 07/16/2020   Sue Jansky, MD Not Taking Active Family Member  nystatin (MYCOSTATIN/NYSTOP) powder [id number] Yes Apply topically 3 (three) times daily. Apply to under panus. Sue Jansky, MD Taking Active Family Member  thiamine 100 MG tablet [id number] Yes Take 1 tablet (100 mg total) by mouth daily. Sue Jansky, MD Taking Active Family Member  zinc sulfate 220 (50 Zn) MG capsule [id number] Yes Take 1 capsule (220 mg total) by mouth daily. Sue Hamman, MD Taking Active   Med List Note [name], CPhT 07/31/20 2348): Granddaughter helped [employer] on outside records but granddaughter said it is not with her meds.. went over all her daily meds          Fall/Depression Screening:  Fall Risk  06/27/2020 05/12/2020 01/02/2020  Falls in the past year? 0 1 1  Number falls in past yr: 0 0 1  Injury with Fall? 0 0 1  Risk for fall due to : - - History of fall(s)  Follow up - - Falls evaluation completed   PHQ 2/9 Scores 06/27/2020 05/12/2020 01/02/2020 06/15/2019 02/19/2019 01/10/2019 11/20/2018  PHQ - 2 Score 0 0 0 0 0 0 1  PHQ- 9 Score 2 4 0 - 0 1 4  Exception Documentation - - - - - Medical reason -    Assessment: BSW completed F/U call with patient. She stated she did get an apartment, but she has not been able to see it yet due to having COVID. Patient states she will move at the beginning of the year.   Plan:  Follow-up:  Patient agrees to Care Plan and Follow-up. BSW will follow up in 30 days.

## 2020-08-12 ENCOUNTER — Ambulatory Visit: Payer: Medicare Other | Admitting: Internal Medicine

## 2020-08-20 ENCOUNTER — Other Ambulatory Visit: Payer: Self-pay | Admitting: Obstetrics and Gynecology

## 2020-08-20 NOTE — Patient Instructions (Signed)
Hi Ms. Wingert, sorry we missed you today  - as a part of your Medicaid benefit, you are eligible for care management and care coordination services at no cost or copay. I was unable to reach you by phone today but would be happy to help you with your health related needs. Please feel free to call me at [phone].  A member of the Managed Medicaid care management team will reach out to you again over the next 7 days.   Aida Raider RN, BSN [place]  [employer] Curator - Managed Medicaid High Risk [phone].

## 2020-08-20 NOTE — Patient Outreach (Signed)
Care Coordination  08/20/2020  [name] [birth year] [id number]    Medicaid Managed Care   Unsuccessful Outreach Note  08/20/2020 Name: [name] MRN: [id number] DOB: [birth year]  Referred by: [name], MD (Inactive) Reason for referral : High Risk Managed Medicaid (Unsuccessful telephone outreach)   An unsuccessful telephone outreach was attempted today. The patient was referred to the case management team for assistance with care management and care coordination.   Follow Up Plan: A member of the Managed Medicaid team will follow up with patient to reschedule appointment within 7 days.  Aida Raider RN, BSN [place]  [employer] Curator - Managed Medicaid High Risk [phone].

## 2020-08-28 ENCOUNTER — Ambulatory Visit: Payer: Medicaid Other

## 2020-08-30 DIAGNOSIS — C50412 Malignant neoplasm of upper-outer quadrant of left female breast: Secondary | ICD-10-CM | POA: Diagnosis not present

## 2020-09-01 ENCOUNTER — Ambulatory Visit: Payer: Medicare Other

## 2020-09-01 ENCOUNTER — Other Ambulatory Visit: Payer: Self-pay | Admitting: Obstetrics and Gynecology

## 2020-09-01 NOTE — Patient Outreach (Cosign Needed)
Medicaid Managed Care   Nurse Care Manager Note  09/01/2020 Name:  [name] MRN:  [id number] DOB:  1955-09-21  [name] is an 65 y.o. year old female who is a primary patient of Fulp, Cammie, MD (Inactive).  The [facility] Managed Care Coordination team was consulted for assistance with:    chronic health conditions.  [name] was given information about Medicaid Managed Care Coordination team services today. [name] agreed to services and verbal consent obtained.  Engaged with patient by telephone for follow up visit in response to provider referral for case management and/or care coordination services.   Assessments/Interventions:  Review of past medical history, allergies, medications, health status, including review of consultants reports, laboratory and other test data, was performed as part of comprehensive evaluation and provision of chronic care management services.  SDOH (Social Determinants of Health) assessments and interventions performed:   Care Plan  Allergies  Allergen Reactions  . Amlodipine Swelling    BLE edema    Medications Reviewed Today    Reviewed by Gayla Medicus, RN (Registered Nurse) on 09/01/20 at [place] List Status: <None>  Medication Order Taking? Sig Documenting Provider Last Dose Status Informant  anastrozole (ARIMIDEX) 1 MG tablet [id number] Yes Take 1 tablet (1 mg total) by mouth daily. [name], Sue Dad, MD Taking Active Family Member  ascorbic acid (VITAMIN C) 500 MG tablet [id number] Yes Take 1 tablet (500 mg total) by mouth daily. Sue Hamman, MD Taking Active   aspirin EC 81 MG EC tablet [id number] Yes Take 1 tablet (81 mg total) by mouth daily. Swallow whole. [name], MD Taking Active Family Member  Blood Pressure Monitor DEVI [id number] Yes Use as instructed by provider to check bloos pressure daily. ICD: I10 Fulp, Ander Gaster, MD Taking Active Family Member  folic acid (FOLVITE) 1 MG tablet [id number] Yes Take 1  tablet (1 mg total) by mouth daily. Sue Hamman, MD Taking Active   furosemide (LASIX) 20 MG tablet [id number] Yes Take 1 tablet (20 mg total) by mouth daily. Sue Lean, MD Taking Active Family Member           Med Note Orma Flaming Aug 06, 2020  9:13 AM) Taking PRN but has needed daily  gabapentin (NEURONTIN) 100 MG capsule [id number] Yes Take 1 capsule (100 mg total) by mouth 2 (two) times daily. [name], Sue Dad, MD Taking Active Family Member  gabapentin (NEURONTIN) 300 MG capsule [id number] Yes Take 1 capsule (300 mg total) by mouth at bedtime. [name], Sue Dad, MD Taking Active Family Member  lidocaine-prilocaine (EMLA) cream [id number] Yes Apply 1 application topically as needed.  Patient taking differently: Apply 1 application topically as needed (port).   [name], Sue Dad, MD Taking Active Family Member  Magnesium Oxide 400 MG CAPS [id number] Yes Take 1 capsule (400 mg total) by mouth daily. Sue Lean, MD Taking Active Family Member  Multiple Vitamin (MULTIVITAMIN WITH MINERALS) TABS tablet [id number] Yes Take 1 tablet by mouth daily. Sue Jansky, MD Taking Active Family Member  Nutritional Supplements (FEEDING SUPPLEMENT, KATE FARMS STANDARD 1.4,) LIQD liquid [id number] No Take 325 mLs by mouth 2 (two) times daily between meals.  Patient not taking: No sig reported   Sue Jansky, MD Not Taking Active Family Member  nystatin (MYCOSTATIN/NYSTOP) powder [id number] Yes Apply topically 3 (three) times daily. Apply to under panus. Sue Jansky, MD Taking Active Family Member  thiamine 100 MG tablet [id number] Yes  Take 1 tablet (100 mg total) by mouth daily. Sue Jansky, MD Taking Active Family Member  zinc sulfate 220 (50 Zn) MG capsule [id number] No Take 1 capsule (220 mg total) by mouth daily.  Patient not taking: Reported on 09/01/2020   Sue Hamman, MD Not Taking Active   Med List Note [name], CPhT 07/31/20  2348): Granddaughter helped [facility] on outside records but granddaughter said it is not with her meds.. went over all her daily meds          Patient Active Problem List   Diagnosis Date Noted  . Acute metabolic encephalopathy 67/20/9470  . COVID-19 virus infection 08/01/2020  . COVID-19 08/01/2020  . Chronic heart failure with preserved ejection fraction ([place]) 06/26/2020  . Thoracic aortic aneurysm without rupture ([place]) 06/26/2020  . Hyponatremia 05/31/2020  . Pancytopenia ([place]) 05/31/2020  . Cellulitis of thigh 04/29/2020  . Tinea corporis 04/29/2020  . Anemia of chronic disease 04/29/2020  . Chronic kidney disease, stage 3a ([place]) 04/29/2020  . Cellulitis of abdominal wall   . Constipation   . Folate deficiency   . Hypomagnesemia   . Debility   . Gout 03/03/2020  . Skin breakdown 03/03/2020  . Skin ulcer of perineum, limited to breakdown of skin ([place]) 03/03/2020  . Leukocytosis 03/03/2020  . Anemia associated with chemotherapy 03/03/2020  . Thrombocytopenia ([place]) 03/03/2020  . Port-A-Cath in place 02/19/2020  . Acute renal failure (ARF) ([place]) 01/22/2020  . S/P left mastectomy 12/19/2019  . Preoperative cardiovascular examination 11/30/2019  . Morbid obesity with BMI of 60.0-69.9, adult ([place]) 11/21/2019  . Family history of ovarian cancer   . Malignant neoplasm of upper-outer quadrant of left breast in female, estrogen receptor positive ([place]) 11/14/2019  . Fracture of radial head, right, closed 06/25/2019  . Frequent falls 01/10/2019  . Bilateral bunions 01/10/2019  . Toenail fungus 01/10/2019  . Alcoholic hepatitis 96/28/3662  . Edema 11/20/2018  . Alcohol-induced mood disorder ([place]) 11/01/2013  . Alcohol dependence ([place]) 11/01/2013  . Essential hypertension 06/20/2007  . LOW BACK PAIN 06/20/2007  . DIVERTICULOSIS, COLON 05/05/2007     Patient Care Plan: Hypertension (Adult)    Problem Identified: Hypertension (Hypertension)     Long-Range Goal: Hypertension  Monitored   Start Date: 09/01/2020  Expected End Date: 11/30/2020  This Visit's Progress: On track  Priority: Medium  Note:    Current Barriers:  . Chronic Disease Management support and education needs related to hypertension.  Nurse Case Manager Clinical Goal(s):  Marland Kitchen Over the next 30 days, patient will attend all scheduled medical appointments:  . Over the next 30 days, patient will work with CM team pharmacist.  Interventions:  . Inter-disciplinary care team collaboration (see longitudinal plan of care) . Evaluation of current treatment plan related to hypertension and patient's adherence to plan as established by provider. . Advised patient to continue to monitor blood pressure by taking everyday. . Reviewed medications with patient. Nash Dimmer with pharmacy. . Discussed plans with patient for ongoing care management follow up and provided patient with direct contact information for care management team . Advised patient, providing education and rationale, to monitor blood pressure daily and record, calling provider for findings outside established parameters.  . Reviewed scheduled/upcoming provider appointments.  Patient Goals/Self-Care Activities Over the next 30 days, patient will:  -Attends all scheduled provider appointments Calls pharmacy for medication refills Calls provider office for new concerns or questions  Follow Up Plan: The Managed Medicaid care management team will  reach out to the patient again over the next 30 days.  The patient has been provided with contact information for the Managed Medicaid care management team and has been advised to call with any health related questions or concerns.      Follow Up:  Patient agrees to Care Plan and Follow-up.  Plan: The Managed Medicaid care management team will reach out to the patient again over the next 30 days. and The patient has been provided with contact information for the Managed Medicaid care management team  and has been advised to call with any health related questions or concerns.  Date/time of next scheduled RN care management/care coordination outreach:  10/02/20 at 1030.

## 2020-09-01 NOTE — Patient Instructions (Signed)
Hi Ms. Lobue, thank you for speaking with me today.  Ms. Bearse was given information about Medicaid Managed Care team care coordination services as a part of their [place] Medicaid benefit. [name] verbally consented to engagement with the [facility] Managed Care team.   For questions related to your [facility], please call: [phone] or visit the homepage here: [email or web address]  If you would like to schedule transportation through your [facility], please call the following number at least 2 days in advance of your appointment: [phone]  Goals Addressed              This Visit's Progress   .  COMPLETED: "My foot is tender but feeling better and swelling has decreased." (pt-stated)        CARE PLAN ENTRY Medicaid Managed Care (see longitudinal plan of care for additional care plan information)  Current Barriers:  . Patient states she has gout and is taking prescribed medication with improvement.  She states she continues PT with [facility] once a week and is doing home exercises twice a week.  She is walking with a walker in her apartment and fixing her meals.   Patient is trying to obtain a 2 bedroom apartment on the first floor.  Nurse Case Manager Clinical Goal(s):  Marland Kitchen Over the next 30 days, patient will verbalize understanding of plan for improved mobility with decreased pain.   Update:  Patient states she is having no pain now.        07/30/20-Patient continues to ambulate well with use of walker and cane.  Interventions:  . Inter-disciplinary care team collaboration (see longitudinal plan of care) . Advised patient to continue plan per provider. . Discussed plans with patient for ongoing care management follow up and provided patient with direct contact information for care management team . Care Guide referral for housing-patient trying to obtain  a 2 bedroom apartment on first floor.  She is also working with [employer] and Wellness case worker to obtain necessary paperwork.   Update:  Paperwork is complete.  Patient picked up paperwork from [employer] and has submitted. . 07/30/20-Patient awaiting to hear from housing authority-told it could be first of year.  Will continue to follow along with patient. . 09/01/20-Patient has moved in new apartment-2 bedroom and ramp-very pleased.  Plan:  . Patient will have continued improvement in tenderness and mobility. Marland Kitchen Update 09/01/20:  patient states no problems at this time. Marland Kitchen RNCM will follow up with patient within 30 days.       Patient Care Plan: Hypertension (Adult)    Problem Identified: Hypertension (Hypertension)     Long-Range Goal: Hypertension Monitored   Start Date: 09/01/2020  Expected End Date: 11/30/2020  This Visit's Progress: On track  Priority: Medium  Note:    Current Barriers:  . Chronic Disease Management support and education needs related to hypertension.  Nurse Case Manager Clinical Goal(s):  Marland Kitchen Over the next 30 days, patient will attend all scheduled medical appointments:  . Over the next 30 days, patient will work with CM team pharmacist.  Interventions:  . Inter-disciplinary care team collaboration (see longitudinal plan of care) . Evaluation of current treatment plan related to hypertension and patient's adherence to plan as established by provider. . Advised patient to continue to monitor blood pressure by taking everyday. . Reviewed medications with patient. Nash Dimmer with pharmacy. . Discussed plans with patient for ongoing care management  follow up and provided patient with direct contact information for care management team . Advised patient, providing education and rationale, to monitor blood pressure daily and record, calling provider for findings outside established parameters.  . Reviewed scheduled/upcoming provider  appointments.  Patient Goals/Self-Care Activities Over the next 30 days, patient will:  -Attends all scheduled provider appointments Calls pharmacy for medication refills Calls provider office for new concerns or questions  Follow Up Plan: The Managed Medicaid care management team will reach out to the patient again over the next 30 days.  The patient has been provided with contact information for the Managed Medicaid care management team and has been advised to call with any health related questions or concerns.      Patient verbalizes understanding of instructions provided today.   The Managed Medicaid care management team will reach out to the patient again over the next 30 days.  The patient has been provided with contact information for the Managed Medicaid care management team and has been advised to call with any health related questions or concerns.  Aida Raider RN, BSN [place]  [facility] - Managed Medicaid High Risk [phone].

## 2020-09-09 ENCOUNTER — Other Ambulatory Visit: Payer: Self-pay

## 2020-09-09 NOTE — Patient Outreach (Signed)
Care Coordination  09/09/2020  Britley Gashi [birth year] [id number]   Medicaid Managed Care   Unsuccessful Outreach Note  09/09/2020 Name: Mystery Schrupp MRN: [id number] DOB: 1956/01/28  Referred by: [name], MD (Inactive) Reason for referral : High Risk Managed Medicaid (HR MM Unsuccessful Telephone Outreach)   An unsuccessful telephone outreach was attempted today. The patient was referred to the case management team for assistance with care management and care coordination.   Follow Up Plan: The care management team will reach out to the patient again over the next 30 days.   Mickel Fuchs, BSW, [place]  High Risk Managed Medicaid Team

## 2020-09-09 NOTE — Patient Instructions (Signed)
Visit Information  [name]  - as a part of your Medicaid benefit, you are eligible for care management and care coordination services at no cost or copay. I was unable to reach you by phone today but would be happy to help you with your health related needs. Please feel free to call me @ [facility] number).   A member of the Managed Medicaid care management team will reach out to you again over the next 30 days.    Mickel Fuchs, BSW, [place]  High Risk Managed Medicaid Team

## 2020-09-30 DIAGNOSIS — C50412 Malignant neoplasm of upper-outer quadrant of left female breast: Secondary | ICD-10-CM | POA: Diagnosis not present

## 2020-10-02 ENCOUNTER — Other Ambulatory Visit: Payer: Self-pay | Admitting: Obstetrics and Gynecology

## 2020-10-02 NOTE — Patient Outreach (Signed)
Care Coordination  10/02/2020  Mineola Duan [birth year] [id number]    Medicaid Managed Care   Unsuccessful Outreach Note  10/02/2020 Name: Cela Newcom MRN: [id number] DOB: 02-Apr-1956  Referred by: [name], MD (Inactive) Reason for referral : High Risk Managed Medicaid (Unsuccessful telephone outreach)   An unsuccessful telephone outreach was attempted today. The patient was referred to the case management team for assistance with care management and care coordination.   Follow Up Plan: A member of the Managed Medicaid care management team will reach out to the patient again over the next 7 days.   Aida Raider RN, BSN [place]  [facility] [employer]ator - Managed Medicaid High Risk [phone],

## 2020-10-02 NOTE — Patient Instructions (Signed)
Hi Ms. Farrier, sorry we missed you today  - as a part of your Medicaid benefit, you are eligible for care management and care coordination services at no cost or copay. I was unable to reach you by phone today but would be happy to help you with your health related needs. Please feel free to call me at [phone].  A member of the Managed Medicaid care management team will reach out to you again over the next 7 days.   Aida Raider RN, BSN [place]  [facility]ator - Managed Medicaid High Risk [phone].

## 2020-10-10 DIAGNOSIS — D631 Anemia in chronic kidney disease: Secondary | ICD-10-CM | POA: Diagnosis not present

## 2020-10-10 DIAGNOSIS — C50912 Malignant neoplasm of unspecified site of left female breast: Secondary | ICD-10-CM | POA: Diagnosis not present

## 2020-10-10 DIAGNOSIS — M109 Gout, unspecified: Secondary | ICD-10-CM | POA: Diagnosis not present

## 2020-10-10 DIAGNOSIS — N189 Chronic kidney disease, unspecified: Secondary | ICD-10-CM | POA: Diagnosis not present

## 2020-10-10 DIAGNOSIS — N289 Disorder of kidney and ureter, unspecified: Secondary | ICD-10-CM | POA: Diagnosis not present

## 2020-10-10 DIAGNOSIS — E871 Hypo-osmolality and hyponatremia: Secondary | ICD-10-CM | POA: Diagnosis not present

## 2020-10-10 DIAGNOSIS — I5030 Unspecified diastolic (congestive) heart failure: Secondary | ICD-10-CM | POA: Diagnosis not present

## 2020-10-16 ENCOUNTER — Encounter: Payer: Self-pay | Admitting: Internal Medicine

## 2020-10-16 ENCOUNTER — Other Ambulatory Visit: Payer: Self-pay

## 2020-10-16 ENCOUNTER — Ambulatory Visit: Payer: Medicare Other | Attending: Internal Medicine | Admitting: Internal Medicine

## 2020-10-16 VITALS — BP 137/79 | HR 69 | Resp 16 | Ht 61.0 in | Wt 286.4 lb

## 2020-10-16 DIAGNOSIS — N1832 Chronic kidney disease, stage 3b: Secondary | ICD-10-CM

## 2020-10-16 DIAGNOSIS — I10 Essential (primary) hypertension: Secondary | ICD-10-CM | POA: Diagnosis not present

## 2020-10-16 DIAGNOSIS — D61818 Other pancytopenia: Secondary | ICD-10-CM

## 2020-10-16 DIAGNOSIS — Z23 Encounter for immunization: Secondary | ICD-10-CM | POA: Diagnosis not present

## 2020-10-16 DIAGNOSIS — I5032 Chronic diastolic (congestive) heart failure: Secondary | ICD-10-CM | POA: Diagnosis not present

## 2020-10-16 DIAGNOSIS — F1021 Alcohol dependence, in remission: Secondary | ICD-10-CM

## 2020-10-16 DIAGNOSIS — Z1211 Encounter for screening for malignant neoplasm of colon: Secondary | ICD-10-CM | POA: Diagnosis not present

## 2020-10-16 DIAGNOSIS — I712 Thoracic aortic aneurysm, without rupture, unspecified: Secondary | ICD-10-CM

## 2020-10-16 DIAGNOSIS — Z6841 Body Mass Index (BMI) 40.0 and over, adult: Secondary | ICD-10-CM

## 2020-10-16 DIAGNOSIS — Z8739 Personal history of other diseases of the musculoskeletal system and connective tissue: Secondary | ICD-10-CM

## 2020-10-16 DIAGNOSIS — Z853 Personal history of malignant neoplasm of breast: Secondary | ICD-10-CM

## 2020-10-16 NOTE — Progress Notes (Signed)
Patient ID: [name], female    DOB: 09-16-1955  MRN: [id number]  CC: re-establish   Subjective: [name] is a 65 y.o. female who presents for chronic disease management and to establish care with me as PCP.  Previous PCP is Dr. Chapman Fitch who is no longer with the practice. Her concerns today include:  Patient with history of HTN, CHFpEF, thoracic aortic aneurysm (Dr. Gasper Sells), EtOH use disorder, CKD 3, breast CA LT ERP (10/2019 mastectomy, 2 rounds chem, XRT, on anastrazole Dr. Jana Hakim), morbid obesity, gout, ACD  ETOH use disorder:  Stopped completely 08/17/2020.  Admits that she was a heavy drinker and had started at age 39. ETOH ran in family - her grandmother and grand father. -has good support system since quitting.  She states that it helps that she does not drive to go get alcohol and she tells anybody who comes to her house not to bring any. -did treatment programs in the past  HTN/DIastolic dysfunction/thoracic AA:  Saw cardiologist 05/2020 Reports compliance with taking carvedilol and tries to limit salt in the foods. She has a wrist BP cuff.  She checks her blood pressure daily.  Some of her recent readings were 130/84, 126/77, yesterday 123/80  History of breast cancer: Diagnosed the early part of last year.  Treated with mastectomy, 2 rounds of chemo, XRT.  Currently on anastrozole.  Still has port in place in right upper chest. On Gabapentin prescribed by oncologist but she is not sure why.  I suspect for probably peripheral neuropathy symptoms.  Noted to have low WBC and is anemic.  Looks like her anemia was associated with chemotherapy but also has a component of anemia of chronic kidney disease.  Patient tells me she is transfused several times last year.  CKD 3: Followed by Dr. Justin Mend home she last saw 10/10/2020.  According to his note, patient had acute kidney insufficiency in October of last year secondary to ATN.BUN and creatinine on that visit were 34/1.61,  GFR of 38.  In reviewing her chart, creatinine that was checked several times in December range from 1.31-1.55 with GFR ranging from 37-45.   Also assessed to have anemia of renal disease with hemoglobin of 9.6 when checked on recent visit with the nephrologist..    Gout: just getting over a flare in RT foot. 2 flares last yr. wonders whether she could be put on medication for gout.  She does not drink red wine and has stopped all alcoholic beverages.  She does not eat too much red meat  Obesity: History of morbid obesity.  She tells me that she is working on trying to get her weight down.  She has seen a nutritionist in the past.  She is eating smaller portion sizes and more fruits.  She is not as active as she would like to be.  When she is out in public she uses a wheelchair.  At home she uses a walker.   HM due for colon cancer screening.  She prefers to have colonoscopy.  Due for the Prevnar 13 vaccine.  She has not had a Pap smear in about 20 years and she is now at the age of 13.  She has not had her COVID booster but plans to get it. Patient Active Problem List   Diagnosis Date Noted  . Acute metabolic encephalopathy 74/07/8785  . COVID-19 virus infection 08/01/2020  . COVID-19 08/01/2020  . Chronic heart failure with preserved ejection fraction ([place]) 06/26/2020  .  Thoracic aortic aneurysm without rupture ([place]) 06/26/2020  . Hyponatremia 05/31/2020  . Pancytopenia ([place]) 05/31/2020  . Cellulitis of thigh 04/29/2020  . Tinea corporis 04/29/2020  . Anemia of chronic disease 04/29/2020  . Chronic kidney disease, stage 3a ([place]) 04/29/2020  . Cellulitis of abdominal wall   . Constipation   . Folate deficiency   . Hypomagnesemia   . Debility   . Gout 03/03/2020  . Skin breakdown 03/03/2020  . Skin ulcer of perineum, limited to breakdown of skin ([place]) 03/03/2020  . Leukocytosis 03/03/2020  . Anemia associated with chemotherapy 03/03/2020  . Thrombocytopenia ([place]) 03/03/2020  .  Port-A-Cath in place 02/19/2020  . Acute renal failure (ARF) ([place]) 01/22/2020  . S/P left mastectomy 12/19/2019  . Preoperative cardiovascular examination 11/30/2019  . Morbid obesity with BMI of 60.0-69.9, adult ([place]) 11/21/2019  . Family history of ovarian cancer   . Malignant neoplasm of upper-outer quadrant of left breast in female, estrogen receptor positive ([place]) 11/14/2019  . Fracture of radial head, right, closed 06/25/2019  . Frequent falls 01/10/2019  . Bilateral bunions 01/10/2019  . Toenail fungus 01/10/2019  . Alcoholic hepatitis 02/27/1600  . Edema 11/20/2018  . Alcohol-induced mood disorder ([place]) 11/01/2013  . Alcohol dependence ([place]) 11/01/2013  . Essential hypertension 06/20/2007  . LOW BACK PAIN 06/20/2007  . DIVERTICULOSIS, COLON 05/05/2007     Current Outpatient Medications on File Prior to Visit  Medication Sig Dispense Refill  . anastrozole (ARIMIDEX) 1 MG tablet Take 1 tablet (1 mg total) by mouth daily. 90 tablet 4  . ascorbic acid (VITAMIN C) 500 MG tablet Take 1 tablet (500 mg total) by mouth daily. 30 tablet 0  . aspirin EC 81 MG EC tablet Take 1 tablet (81 mg total) by mouth daily. Swallow whole. 30 tablet 11  . Blood Pressure Monitor DEVI Use as instructed by provider to check bloos pressure daily. ICD: U93 1 each 0  . folic acid (FOLVITE) 1 MG tablet Take 1 tablet (1 mg total) by mouth daily. 30 tablet 0  . furosemide (LASIX) 20 MG tablet Take 1 tablet (20 mg total) by mouth daily. 90 tablet 3  . gabapentin (NEURONTIN) 100 MG capsule Take 1 capsule (100 mg total) by mouth 2 (two) times daily. 60 capsule 1  . gabapentin (NEURONTIN) 300 MG capsule Take 1 capsule (300 mg total) by mouth at bedtime. 90 capsule 6  . lidocaine-prilocaine (EMLA) cream Apply 1 application topically as needed. (Patient taking differently: Apply 1 application topically as needed (port).) 30 g 0  . Magnesium Oxide 400 MG CAPS Take 1 capsule (400 mg total) by mouth daily. 90 capsule 3   . Multiple Vitamin (MULTIVITAMIN WITH MINERALS) TABS tablet Take 1 tablet by mouth daily.    . Nutritional Supplements (FEEDING SUPPLEMENT, KATE FARMS STANDARD 1.4,) LIQD liquid Take 325 mLs by mouth 2 (two) times daily between meals. (Patient not taking: No sig reported) 325 mL 12  . nystatin (MYCOSTATIN/NYSTOP) powder Apply topically 3 (three) times daily. Apply to under panus. 60 g 0  . thiamine 100 MG tablet Take 1 tablet (100 mg total) by mouth daily. 30 tablet 0  . zinc sulfate 220 (50 Zn) MG capsule Take 1 capsule (220 mg total) by mouth daily. (Patient not taking: Reported on 09/01/2020) 30 capsule 0   No current facility-administered medications on file prior to visit.    Allergies  Allergen Reactions  . Amlodipine Swelling    BLE edema    Social History  Socioeconomic History  . Marital status: Legally Separated    Spouse name: Not on file  . Number of children: Not on file  . Years of education: Not on file  . Highest education level: Not on file  Occupational History  . Not on file  Tobacco Use  . Smoking status: Never Smoker  . Smokeless tobacco: Never Used  Vaping Use  . Vaping Use: Never used  Substance and Sexual Activity  . Alcohol use: Yes    Alcohol/week: 3.0 standard drinks    Types: 1 Cans of beer, 2 Shots of liquor per week    Comment: daily  . Drug use: Yes    Types: Marijuana  . Sexual activity: Not Currently  Other Topics Concern  . Not on file  Social History Narrative  . Not on file   Social Determinants of Health   Financial Resource Strain: Low Risk   . Difficulty of Paying Living Expenses: Not hard at all  Food Insecurity: No Food Insecurity  . Worried About Charity fundraiser in the Last Year: Never true  . Ran Out of Food in the Last Year: Never true  Transportation Needs: No Transportation Needs  . Lack of Transportation (Medical): No  . Lack of Transportation (Non-Medical): No  Physical Activity: Not on file  Stress: Not on  file  Social Connections: Not on file  Intimate Partner Violence: Not on file    Family History  Problem Relation Age of Onset  . Hypertension Mother   . Dementia Mother   . Ovarian cancer Half-Sister 20  . Diabetes Half-Sister   . Stroke Half-Sister     Past Surgical History:  Procedure Laterality Date  . MASTECTOMY WITH AXILLARY LYMPH NODE DISSECTION Left 12/19/2019   Procedure: LEFT MASTECTOMY WITH AXILLARY LYMPH NODE DISSECTION;  Surgeon: [name], MD;  Location: [facility];  Service: General;  Laterality: Left;  Marland Kitchen MULTIPLE EXTRACTIONS WITH ALVEOLOPLASTY Bilateral 12/14/2019   Procedure: MULTIPLE EXTRACTION WITH ALVEOLOPLASTY;  Surgeon: [name], DDS;  Location: [facility];  Service: Oral Surgery;  Laterality: Bilateral;  . PORTACATH PLACEMENT N/A 12/19/2019   Procedure: INSERTION PORT-A-CATH WITH ULTRASOUND GUIDANCE;  Surgeon: [name], MD;  Location: [facility];  Service: General;  Laterality: N/A;  . RADIAL HEAD ARTHROPLASTY Right 06/25/2019   Procedure: RIGHT RADIAL HEAD ARTHROPLASTY;  Surgeon: [name], MD;  Location: [facility];  Service: Orthopedics;  Laterality: Right;  . TUBAL LIGATION      ROS: Review of Systems Negative except as stated above  PHYSICAL EXAM: BP 137/79   Pulse 69   Resp 16   Ht 5\' 1"  (1.549 m)   Wt 286 lb 6.4 oz (129.9 kg)   SpO2 100%   BMI 54.11 kg/m   Wt Readings from Last 3 Encounters:  10/16/20 286 lb 6.4 oz (129.9 kg)  08/01/20 277 lb 9 oz (125.9 kg)  07/09/20 277 lb 9.6 oz (125.9 kg)    Physical Exam  General appearance - alert, well appearing, morbidly obese older African-American female and in no distress Mental status - normal mood, behavior, speech, dress, motor activity, and thought processes Neck - supple, no significant adenopathy Chest - clear to auscultation, no wheezes, rales or rhonchi, symmetric air entry Heart - normal rate, regular rhythm, normal S1, S2, no murmurs, rubs, clicks or gallops Musculoskeletal  -right foot: No edema, erythema or point tenderness at this time.  Patient is in a wheelchair. Extremities -skin on the legs are dry.  Trace  nonpitting edema of the lower legs CMP Latest Ref Rng & Units 08/03/2020 08/02/2020 08/01/2020  Glucose 70 - 99 mg/dL 93 109(H) 81  BUN 8 - 23 mg/dL 17 18 19   Creatinine 0.44 - 1.00 mg/dL 1.32(H) 1.31(H) 1.39(H)  Sodium 135 - 145 mmol/L 137 134(L) 138  Potassium 3.5 - 5.1 mmol/L 3.2(L) 2.9(L) 3.2(L)  Chloride 98 - 111 mmol/L 105 100 108  CO2 22 - 32 mmol/L 22 22 18(L)  Calcium 8.9 - 10.3 mg/dL 8.1(L) 7.6(L) 7.1(L)  Total Protein 6.5 - 8.1 g/dL 6.1(L) 6.2(L) -  Total Bilirubin 0.3 - 1.2 mg/dL 0.7 0.5 -  Alkaline Phos 38 - 126 U/L 40 40 -  AST 15 - 41 U/L 39 40 -  ALT 0 - 44 U/L 21 23 -   Lipid Panel     Component Value Date/Time   CHOL 216 (H) 02/10/2020 1536   TRIG 68 07/31/2020 2303   HDL 100 02/10/2020 1536   CHOLHDL 2.2 02/10/2020 1536   VLDL 20 02/10/2020 1536   LDLCALC 96 02/10/2020 1536    CBC    Component Value Date/Time   WBC 2.4 (L) 08/03/2020 0440   RBC 2.76 (L) 08/03/2020 0440   HGB 8.6 (L) 08/03/2020 0440   HGB 10.7 (L) 03/25/2020 1103   HCT 26.8 (L) 08/03/2020 0440   HCT 31.5 (L) 03/25/2020 1103   PLT 164 08/03/2020 0440   PLT 352 03/25/2020 1103   MCV 97.1 08/03/2020 0440   MCV 91 03/25/2020 1103   MCH 31.2 08/03/2020 0440   MCHC 32.1 08/03/2020 0440   RDW 13.5 08/03/2020 0440   RDW 15.3 03/25/2020 1103   LYMPHSABS 1.1 08/03/2020 0440   LYMPHSABS 1.5 03/25/2020 1103   MONOABS 0.3 08/03/2020 0440   EOSABS 0.1 08/03/2020 0440   EOSABS 0.4 03/25/2020 1103   BASOSABS 0.0 08/03/2020 0440   BASOSABS 0.0 03/25/2020 1103    ASSESSMENT AND PLAN: 1. Essential hypertension Not at goal today but she reports home blood pressure readings that are good.  She will continue current dose of carvedilol and low-salt diet.  2. Chronic heart failure with preserved ejection fraction (HCC) Stable.  She uses furosemide as needed.   Good blood pressure control discussed.  3. Morbid obesity with BMI of 50.0-59.9, adult [facility]) Discussed and encourage healthy eating habits.  Advised to eliminate sugary drinks from the diet and drink more water, cut back on portion sizes especially of white carbohydrates, eat more white lean meat instead of red meat and incorporate fresh fruits and vegetables into the diet. - Hemoglobin A1c  4. Alcohol use disorder, severe, in early remission ([place]) Commended her on quitting.  Encouraged her to remain free of alcoholic beverages.  She reports good family support  5. Stage 3b chronic kidney disease ([place]) Continue to monitor.  Avoid medications that can cause kidney function to worsen.  6. Pancytopenia ([place]) Followed by her oncologist. - CBC  7. Thoracic aortic aneurysm without rupture ([place]) Followed by cardiology  8. History of left breast cancer Patient has completed active treatment and now is on anastrozole  9. History of gout We will check uric acid level.  If it is elevated which I suspect it will be given her kidney function and may consider starting her on low-dose of allopurinol - Uric Acid  10. Screening for colon cancer - Ambulatory referral to Gastroenterology  11. Need for vaccination against Streptococcus pneumoniae using pneumococcal conjugate vaccine 13 - Pneumococcal conjugate vaccine 13-valent  Patient given  phone number to call to schedule appointment for her COVID booster  Patient was given the opportunity to ask questions.  Patient verbalized understanding of the plan and was able to repeat key elements of the plan.   No orders of the defined types were placed in this encounter.    Requested Prescriptions    No prescriptions requested or ordered in this encounter    No follow-ups on file.  Karle Plumber, MD, FACP

## 2020-10-16 NOTE — Patient Instructions (Addendum)
Healthy Eating Following a healthy eating pattern may help you to achieve and maintain a healthy body weight, reduce the risk of chronic disease, and live a long and productive life. It is important to follow a healthy eating pattern at an appropriate calorie level for your body. Your nutritional needs should be met primarily through food by choosing a variety of nutrient-rich foods. What are tips for following this plan? Reading food labels  Read labels and choose the following: ? Reduced or low sodium. ? Juices with 100% fruit juice. ? Foods with low saturated fats and high polyunsaturated and monounsaturated fats. ? Foods with whole grains, such as whole wheat, cracked wheat, brown rice, and wild rice. ? Whole grains that are fortified with folic acid. This is recommended for women who are pregnant or who want to become pregnant.  Read labels and avoid the following: ? Foods with a lot of added sugars. These include foods that contain brown sugar, corn sweetener, corn syrup, dextrose, fructose, glucose, high-fructose corn syrup, honey, invert sugar, lactose, malt syrup, maltose, molasses, raw sugar, sucrose, trehalose, or turbinado sugar.  Do not eat more than the following amounts of added sugar per day:  6 teaspoons (25 g) for women.  9 teaspoons (38 g) for men. ? Foods that contain processed or refined starches and grains. ? Refined grain products, such as white flour, degermed cornmeal, white bread, and white rice. Shopping  Choose nutrient-rich snacks, such as vegetables, whole fruits, and nuts. Avoid high-calorie and high-sugar snacks, such as potato chips, fruit snacks, and candy.  Use oil-based dressings and spreads on foods instead of solid fats such as butter, stick margarine, or cream cheese.  Limit pre-made sauces, mixes, and "instant" products such as flavored rice, instant noodles, and ready-made pasta.  Try more plant-protein sources, such as tofu, tempeh, black beans,  edamame, lentils, nuts, and seeds.  Explore eating plans such as the Mediterranean diet or vegetarian diet. Cooking  Use oil to saut or stir-fry foods instead of solid fats such as butter, stick margarine, or lard.  Try baking, boiling, grilling, or broiling instead of frying.  Remove the fatty part of meats before cooking.  Steam vegetables in water or broth. Meal planning  At meals, imagine dividing your plate into fourths: ? One-half of your plate is fruits and vegetables. ? One-fourth of your plate is whole grains. ? One-fourth of your plate is protein, especially lean meats, poultry, eggs, tofu, beans, or nuts.  Include low-fat dairy as part of your daily diet.   Lifestyle  Choose healthy options in all settings, including home, work, school, restaurants, or stores.  Prepare your food safely: ? Wash your hands after handling raw meats. ? Keep food preparation surfaces clean by regularly washing with hot, soapy water. ? Keep raw meats separate from ready-to-eat foods, such as fruits and vegetables. ? Cook seafood, meat, poultry, and eggs to the recommended internal temperature. ? Store foods at safe temperatures. In general:  Keep cold foods at 40F (4.4C) or below.  Keep hot foods at 140F (60C) or above.  Keep your freezer at 0F (-17.8C) or below.  Foods are no longer safe to eat when they have been between the temperatures of 40-140F (4.4-60C) for more than 2 hours. What foods should I eat? Fruits Aim to eat 2 cup-equivalents of fresh, canned (in natural juice), or frozen fruits each day. Examples of 1 cup-equivalent of fruit include 1 small apple, 8 large strawberries, 1 cup canned fruit,    cup dried fruit, or 1 cup 100% juice. Vegetables Aim to eat 2-3 cup-equivalents of fresh and frozen vegetables each day, including different varieties and colors. Examples of 1 cup-equivalent of vegetables include 2 medium carrots, 2 cups raw, leafy greens, 1 cup chopped  vegetable (raw or cooked), or 1 medium baked potato. Grains Aim to eat 6 ounce-equivalents of whole grains each day. Examples of 1 ounce-equivalent of grains include 1 slice of bread, 1 cup ready-to-eat cereal, 3 cups popcorn, or  cup cooked rice, pasta, or cereal. Meats and other proteins Aim to eat 5-6 ounce-equivalents of protein each day. Examples of 1 ounce-equivalent of protein include 1 egg, 1/2 cup nuts or seeds, or 1 tablespoon (16 g) peanut butter. A cut of meat or fish that is the size of a deck of cards is about 3-4 ounce-equivalents.  Of the protein you eat each week, try to have at least 8 ounces come from seafood. This includes salmon, trout, herring, and anchovies. Dairy Aim to eat 3 cup-equivalents of fat-free or low-fat dairy each day. Examples of 1 cup-equivalent of dairy include 1 cup (240 mL) milk, 8 ounces (250 g) yogurt, 1 ounces (44 g) natural cheese, or 1 cup (240 mL) fortified soy milk. Fats and oils  Aim for about 5 teaspoons (21 g) per day. Choose monounsaturated fats, such as canola and olive oils, avocados, peanut butter, and most nuts, or polyunsaturated fats, such as sunflower, corn, and soybean oils, walnuts, pine nuts, sesame seeds, sunflower seeds, and flaxseed. Beverages  Aim for six 8-oz glasses of water per day. Limit coffee to three to five 8-oz cups per day.  Limit caffeinated beverages that have added calories, such as soda and energy drinks.  Limit alcohol intake to no more than 1 drink a day for nonpregnant women and 2 drinks a day for men. One drink equals 12 oz of beer (355 mL), 5 oz of wine (148 mL), or 1 oz of hard liquor (44 mL). Seasoning and other foods  Avoid adding excess amounts of salt to your foods. Try flavoring foods with herbs and spices instead of salt.  Avoid adding sugar to foods.  Try using oil-based dressings, sauces, and spreads instead of solid fats. This information is based on general [place] nutrition guidelines. For more  information, visit choosemyplate.gov. Exact amounts may vary based on your nutrition needs. Summary  A healthy eating plan may help you to maintain a healthy weight, reduce the risk of chronic diseases, and stay active throughout your life.  Plan your meals. Make sure you eat the right portions of a variety of nutrient-rich foods.  Try baking, boiling, grilling, or broiling instead of frying.  Choose healthy options in all settings, including home, work, school, restaurants, or stores. This information is not intended to replace advice given to you by your health care provider. Make sure you discuss any questions you have with your health care provider. Document Revised: 11/28/2017 Document Reviewed: 11/28/2017 Elsevier Patient Education  2021 Elsevier Inc.  

## 2020-10-17 LAB — CBC
Hematocrit: 29.8 % — ABNORMAL LOW (ref 34.0–46.6)
Hemoglobin: 9.8 g/dL — ABNORMAL LOW (ref 11.1–15.9)
MCH: 30.9 pg (ref 26.6–33.0)
MCHC: 32.9 g/dL (ref 31.5–35.7)
MCV: 94 fL (ref 79–97)
Platelets: 236 10*3/uL (ref 150–450)
RBC: 3.17 x10E6/uL — ABNORMAL LOW (ref 3.77–5.28)
RDW: 14 % (ref 11.7–15.4)
WBC: 5.1 10*3/uL (ref 3.4–10.8)

## 2020-10-17 LAB — HEMOGLOBIN A1C
Est. average glucose Bld gHb Est-mCnc: 117 mg/dL
Hgb A1c MFr Bld: 5.7 % — ABNORMAL HIGH (ref 4.8–5.6)

## 2020-10-17 LAB — URIC ACID: Uric Acid: 8.3 mg/dL — ABNORMAL HIGH (ref 3.0–7.2)

## 2020-10-18 NOTE — Progress Notes (Signed)
Let patient know that she is still anemic but blood cell count has improved.  She is still in the range for prediabetes but this has improved also.  Continue trying to eat healthy.  Uric acid level for the gout is mildly elevated.  We will discuss management of that on her next visit.

## 2020-10-20 ENCOUNTER — Telehealth: Payer: Self-pay

## 2020-10-20 NOTE — Telephone Encounter (Signed)
Contacted pt to go over lab results pt didn't answer lvm  

## 2020-10-21 ENCOUNTER — Other Ambulatory Visit: Payer: Self-pay | Admitting: Obstetrics and Gynecology

## 2020-10-21 ENCOUNTER — Telehealth: Payer: Self-pay | Admitting: *Deleted

## 2020-10-21 NOTE — Telephone Encounter (Signed)
Patient called back and was notified of results. Patient had question: patient states she is having full body joint pain when she gets up and moves. Patient states she has had it for a while- she was in her wheel chair at appointment- so it was not discussed. Patient states she did have PT- and she is still doing her exercises as she was instructed during her pT. Patient wants to know if the Gabapentin could be causing- or the increased Uric acid. Advised patient would send message to PCP for response

## 2020-10-21 NOTE — Patient Instructions (Signed)
Visit Information  Ms. [name] . I was unable to reach you by phone today. Please feel free to call me at [phone].  A member of the Managed Medicaid care management team will reach out to you again over the next 7 days.   Aida Raider RN, BSN [place]  [facility] - Managed Medicaid High Risk [phone].

## 2020-10-21 NOTE — Patient Outreach (Signed)
Care Coordination  10/21/2020  [name] Sep 04, 1955 [id number]    Medicaid Managed Care   Unsuccessful Outreach Note  10/21/2020 Name: [name] MRN: [id number] DOB: Jun 20, 1956  Referred by: Ladell Pier, MD Reason for referral : High Risk Managed Medicaid (Unsuccessful telephone outreach)   A second unsuccessful telephone outreach was attempted today. The patient was referred to the case management team for assistance with care management and care coordination.   Follow Up Plan:  A member of the Managed Medicaid Care Management team will reach out to the patient within the next 7 days.  Aida Raider RN, BSN [place]  [facility] - Managed Medicaid High Risk [phone].

## 2020-10-22 NOTE — Telephone Encounter (Signed)
Contacted pt to go over Dr. Johnson message pt didn't answer lvm  

## 2020-10-27 ENCOUNTER — Encounter: Payer: Self-pay | Admitting: Gastroenterology

## 2020-10-27 ENCOUNTER — Other Ambulatory Visit: Payer: Self-pay | Admitting: Obstetrics and Gynecology

## 2020-10-27 NOTE — Patient Instructions (Signed)
Hi Ms. [place] we missed you today  - as a part of your Medicaid benefit, you are eligible for care management and care coordination services at no cost or copay. I was unable to reach you by phone today but would be happy to help you with your health related needs. Please feel free to call me at [phone].   Aida Raider RN, BSN [place]  [facility] - Managed Medicaid High Risk [phone].

## 2020-10-27 NOTE — Patient Outreach (Signed)
Care Coordination  10/27/2020  [name] Jul 18, 1956 [id number]    Medicaid Managed Care   Unsuccessful Outreach Note  10/27/2020 Name: [name] MRN: [id number] DOB: [birth year]  Referred by: Ladell Pier, MD Reason for referral : High Risk Managed Medicaid (Unsuccessful telephone outreach)   Third unsuccessful telephone outreach was attempted today. The patient was referred to the case management team for assistance with care management and care coordination. The patient's primary care provider has been notified of our unsuccessful attempts to make or maintain contact with the patient. The care management team is pleased to engage with this patient at any time in the future should he/she be interested in assistance from the care management team.   Follow Up Plan: We have been unable to make contact with the patient for follow up. The care management team is available to follow up with the patient after provider conversation with the patient regarding recommendation for care management engagement and subsequent re-referral to the care management team.   Aida Raider RN, BSN [place] Management Coordinator - Managed [facility] High Risk [phone].

## 2020-10-28 DIAGNOSIS — C50412 Malignant neoplasm of upper-outer quadrant of left female breast: Secondary | ICD-10-CM | POA: Diagnosis not present

## 2020-11-10 NOTE — Progress Notes (Signed)
[name]  [phone] [phone]     ID: [name] DOB: Jan 22, 1956  MR#: [id number]  WLN#:[id number]  Patient Care Team: Sue Pier, MD as PCP - General (Internal Medicine) Sue Kaufmann, RN as Oncology Nurse Navigator Sue Germany, RN as Oncology Nurse Navigator Sue Keens, MD as Consulting Physician (General Surgery) [name], Sue Dad, MD as Consulting Physician (Oncology) Sue Rudd, MD as Consulting Physician (Radiation Oncology) [name], DPM as Consulting Physician (Podiatry) [name] as Social Worker [name], [facility] as Pharmacist (Pharmacist) Sue Cruel, MD OTHER MD:   CHIEF COMPLAINT: estrogen receptor positive breast cancer  CURRENT TREATMENT: anastrozole;    INTERVAL HISTORY: [name] returns today for follow up of her estrogen receptor positive breast cancer accompanied by her grand [name].  She continues on anastrozole.  She tolerates this with no side effects that she is aware of, particularly no hot flashes, no vaginal dryness symptoms, and no arthralgias or myalgias.  Since her last visit, she presented to the ED on 07/31/2020 with Covid-19 symptoms following exposure to her brother, who is her primary caretaker and was diagnosed with Covid-19. She was found to be positive and was admitted.  She is due for annual mammography.   REVIEW OF SYSTEMS: [name] tells me she feels generally well except that she aches all over.  She says everyone in her bones hurts although she points particularly to her left arm.  She denies any recent falls.  She said it is like a toothache.  She tells me she has had no alcohol since late December 2021.  She says she walks around at home with no difficulty, and that she no longer needs an aide.  After her visit here today she is planning to go to the park with her grandchildren.  A detailed review of systems today was otherwise  stable  COVID 19 VACCINATION STATUS: Status post Moderna x2, with the booster October 2021; infection early 2020 and December 2021   HISTORY OF CURRENT ILLNESS: From the original intake note:  [name] (pronounced "yellow") had routine screening mammography on 10/24/2019 showing a possible abnormality in the left breast. She underwent left breast ultrasonography at [facility] on 11/05/2019 showing: suspicious 3.8 cm mass in left breast at 3 o'clock; five abnormal lymph nodes.  Accordingly on 11/12/2019 she proceeded to biopsy of the left breast area in question as well as a lymph node. The pathology from this procedure ([id number]) showed: invasive ductal carcinoma with prominent inflammatory response, grade 3. Prognostic indicators significant for: estrogen receptor, 100% positive and progesterone receptor, 50% positive, both with strong staining intensity. Proliferation marker Ki67 at 70%. HER2 negative by immunohistochemistry (0).  The biopsied lymph node confirmed metastatic breast carcinoma.  The patient's subsequent history is as detailed below.   PAST MEDICAL HISTORY: Past Medical History:  Diagnosis Date  . Arthritis   . Cancer ([place]) 11/2019   Left breast  . Diverticulitis   . Family history of ovarian cancer   . Heart murmur    11/26/19 echo: Mild AS. AV mean gradient 12.0 mmHg; however, LVOT gradient 8 mmHg with intracvitary gradient-significant AS is not suspected  . Hypertension     PAST SURGICAL HISTORY: Past Surgical History:  Procedure Laterality Date  . MASTECTOMY WITH AXILLARY LYMPH NODE DISSECTION Left 12/19/2019   Procedure: LEFT MASTECTOMY WITH AXILLARY LYMPH NODE DISSECTION;  Surgeon: Sue Keens, MD;  Location: [place];  Service: General;  Laterality:  Left;  . MULTIPLE EXTRACTIONS WITH ALVEOLOPLASTY Bilateral 12/14/2019   Procedure: MULTIPLE EXTRACTION WITH ALVEOLOPLASTY;  Surgeon: [name], DDS;  Location: [place];  Service: Oral Surgery;   Laterality: Bilateral;  . PORTACATH PLACEMENT N/A 12/19/2019   Procedure: INSERTION PORT-A-CATH WITH ULTRASOUND GUIDANCE;  Surgeon: Sue Keens, MD;  Location: [place];  Service: General;  Laterality: N/A;  . RADIAL HEAD ARTHROPLASTY Right 06/25/2019   Procedure: RIGHT RADIAL HEAD ARTHROPLASTY;  Surgeon: [name], MD;  Location: [place];  Service: Orthopedics;  Laterality: Right;  . TUBAL LIGATION      FAMILY HISTORY: Family History  Problem Relation Age of Onset  . Hypertension Mother   . Dementia Mother   . Ovarian cancer Half-Sister 59  . Diabetes Half-Sister   . Stroke Half-Sister   Her father died at age 60; she is unsure of the cause of death. Her mother is age 68 as of 10/2019.  The patient has two brothers and four sisters. She reports ovarian cancer in her sister at age 4. She denies a family history of breast, prostate, or pancreatic cancer.   GYNECOLOGIC HISTORY:  No LMP recorded. Patient is postmenopausal. Menarche: 65 years old Age at first live birth: 65 years old [place] 2 LMP around age 47 Contraceptive: used previously HRT never used  Hysterectomy? no BSO? no   SOCIAL HISTORY: (updated11/2021)  [name] retired from working at the [employer]. She is divorced. She lives at home  currently with her granddaughter [name]. [name], age 26, lives in [place] and is handicapped. [name], age 64, is a Biomedical scientist here in [place]. [name] has two granddaughters. She attends a local [employer].    ADVANCED DIRECTIVES: not in place; at the 11/21/2019 visit the patient was given the appropriate documents to complete and notarized at her discretion.  She expressed spontaneously that she would not want to be resuscitated in case of a terminal event.    HEALTH MAINTENANCE: Social History   Tobacco Use  . Smoking status: Never Smoker  . Smokeless tobacco: Never Used  Vaping Use  . Vaping Use: Never used  Substance Use Topics  . Alcohol use: Yes     Alcohol/week: 3.0 standard drinks    Types: 1 Cans of beer, 2 Shots of liquor per week    Comment: daily  . Drug use: Yes    Types: Marijuana     Colonoscopy: never done  PAP: "1980?"  Bone density: never done   Allergies  Allergen Reactions  . Amlodipine Swelling    BLE edema    Current Outpatient Medications  Medication Sig Dispense Refill  . anastrozole (ARIMIDEX) 1 MG tablet Take 1 tablet (1 mg total) by mouth daily. 90 tablet 4  . ascorbic acid (VITAMIN C) 500 MG tablet Take 1 tablet (500 mg total) by mouth daily. 30 tablet 0  . aspirin EC 81 MG EC tablet Take 1 tablet (81 mg total) by mouth daily. Swallow whole. 30 tablet 11  . Biotin 10000 MCG TABS Take by mouth.    . Blood Pressure Monitor DEVI Use as instructed by provider to check bloos pressure daily. ICD: I10 1 each 0  . carvedilol (COREG) 12.5 MG tablet Take 12.5 mg by mouth 2 (two) times daily.    . folic acid (FOLVITE) 1 MG tablet Take 1 tablet (1 mg total) by mouth daily. 30 tablet 0  . furosemide (LASIX) 20 MG tablet Take 1 tablet (20 mg total) by mouth  daily. 90 tablet 3  . gabapentin (NEURONTIN) 100 MG capsule Take 1 capsule (100 mg total) by mouth 2 (two) times daily. 60 capsule 1  . gabapentin (NEURONTIN) 300 MG capsule Take 1 capsule (300 mg total) by mouth at bedtime. 90 capsule 6  . lidocaine-prilocaine (EMLA) cream Apply 1 application topically as needed. (Patient taking differently: Apply 1 application topically as needed (port).) 30 g 0  . Magnesium Oxide 400 MG CAPS Take 1 capsule (400 mg total) by mouth daily. 90 capsule 3  . Multiple Vitamin (MULTIVITAMIN WITH MINERALS) TABS tablet Take 1 tablet by mouth daily.    Marland Kitchen nystatin (MYCOSTATIN/NYSTOP) powder Apply topically 3 (three) times daily. Apply to under panus. 60 g 0  . thiamine 100 MG tablet Take 1 tablet (100 mg total) by mouth daily. 30 tablet 0   No current facility-administered medications for this visit.    OBJECTIVE: African-American woman  examined in a wheelchair Vitals:   11/11/20 1143  BP: (!) 143/74  Pulse: 74  Resp: 17  Temp: 97.7 F (36.5 C)  SpO2: 100%     Body mass index is 55.49 kg/m.   Wt Readings from Last 3 Encounters:  11/11/20 293 lb 11.2 oz (133.2 kg)  10/16/20 286 lb 6.4 oz (129.9 kg)  08/01/20 277 lb 9 oz (125.9 kg)   Sclerae unicteric, EOMs intact Wearing a mask No cervical or supraclavicular adenopathy Lungs no rales or rhonchi Heart regular rate and rhythm Abd soft, obese, nontender, positive bowel sounds MSK no focal spinal tenderness, no upper extremity lymphedema Neuro: nonfocal, well oriented, appropriate affect Breasts: The right breast is benign.  The left breast is status post mastectomy and radiation.  There is hyperpigmentation but no evidence of local recurrence.  Both axillae are benign   LAB RESULTS:  CMP     Component Value Date/Time   NA 139 11/11/2020 1122   NA 138 06/26/2020 0843   K 3.8 11/11/2020 1122   CL 99 11/11/2020 1122   CO2 31 11/11/2020 1122   GLUCOSE 105 (H) 11/11/2020 1122   BUN 31 (H) 11/11/2020 1122   BUN 23 06/26/2020 0843   CREATININE 1.95 (H) 11/11/2020 1122   CREATININE 1.59 (H) 11/21/2019 0908   CALCIUM 9.6 11/11/2020 1122   PROT 7.9 11/11/2020 1122   PROT 8.5 06/15/2019 1622   ALBUMIN 3.4 (L) 11/11/2020 1122   ALBUMIN 4.3 06/15/2019 1622   AST 18 11/11/2020 1122   AST 61 (H) 11/21/2019 0908   ALT 10 11/11/2020 1122   ALT 32 11/21/2019 0908   ALKPHOS 51 11/11/2020 1122   BILITOT 0.4 11/11/2020 1122   BILITOT 0.5 11/21/2019 0908   GFRNONAA 28 (L) 11/11/2020 1122   GFRNONAA 34 (L) 11/21/2019 0908   GFRAA 40 (L) 06/26/2020 0843   GFRAA 39 (L) 11/21/2019 0908    Lab Results  Component Value Date   TOTALPROTELP 6.9 06/02/2020   ALBUMINELP 3.3 06/02/2020   A1GS 0.2 06/02/2020   A2GS 0.7 06/02/2020   BETS 0.8 06/02/2020   GAMS 1.8 06/02/2020   MSPIKE Not Observed 06/02/2020   SPEI Comment 06/02/2020    Lab Results  Component Value  Date   WBC 4.2 11/11/2020   NEUTROABS 2.5 11/11/2020   HGB 9.3 (L) 11/11/2020   HCT 28.6 (L) 11/11/2020   MCV 94.4 11/11/2020   PLT 163 11/11/2020    No results found for: LABCA2  No components found for: QPYPPJ093  No results for input(s): INR in the last 168  hours.  No results found for: LABCA2  No results found for: JQZ009  No results found for: CAN125  No results found for: QZR007  Lab Results  Component Value Date   CA2729 10.6 05/15/2020    No components found for: HGQUANT  No results found for: CEA1 / No results found for: CEA1   No results found for: AFPTUMOR  No results found for: [facility]  Lab Results  Component Value Date   KPAFRELGTCHN 131.4 (H) 06/02/2020   LAMBDASER 62.0 (H) 06/02/2020   KAPLAMBRATIO 2.12 (H) 06/02/2020   (kappa/lambda light chains)  No results found for: HGBA, HGBA2QUANT, HGBFQUANT, HGBSQUAN (Hemoglobinopathy evaluation)   Lab Results  Component Value Date   LDH 190 07/31/2020    Lab Results  Component Value Date   IRON 130 06/01/2020   TIBC 133 (L) 06/01/2020   IRONPCTSAT 97 (H) 06/01/2020   (Iron and TIBC)  Lab Results  Component Value Date   FERRITIN 1,082 (H) 07/31/2020    Urinalysis    Component Value Date/Time   COLORURINE YELLOW 05/31/2020 1923   APPEARANCEUR CLEAR 05/31/2020 1923   LABSPEC 1.012 05/31/2020 1923   PHURINE 5.0 05/31/2020 1923   GLUCOSEU NEGATIVE 05/31/2020 1923   HGBUR NEGATIVE 05/31/2020 1923   BILIRUBINUR NEGATIVE 05/31/2020 [place] NEGATIVE 05/31/2020 1923   PROTEINUR 100 (A) 05/31/2020 1923   UROBILINOGEN 1.0 07/14/2012 1630   NITRITE NEGATIVE 05/31/2020 1923   LEUKOCYTESUR NEGATIVE 05/31/2020 1923     STUDIES: No results found.   ELIGIBLE FOR AVAILABLE RESEARCH PROTOCOL: AET  ASSESSMENT: 65 y.o. [place] woman status post left breast upper outer quadrant and left axillary lymph node biopsies 11/12/2019 for a clinical T2 N2, stage IIIA invasive ductal  carcinoma, grade 3, estrogen and progesterone receptor positive, HER-2 not amplified, with an MIB-1 of 70%  (a) the biopsied axillary lymph node was positive  (1) genetics testing 11/29/2019 through the Breast Cancer STAT Panel offered by [facility] found no deleterious mutations in ATM, BRCA1, BRCA2, CDH1, CHEK2, PALB2, PTEN, STK11 and TP53.  Additional testing through the Common Hereditary Cancers Panel confirmed no deleterious mutations in APC, ATM, AXIN2, BARD1, BMPR1A, BRCA1, BRCA2, BRIP1, CDH1, CDK4, CDKN2A (p14ARF), CDKN2A (p16INK4a), CHEK2, CTNNA1, DICER1, EPCAM (Deletion/duplication testing only), GREM1 (promoter region deletion/duplication testing only), KIT, MEN1, MLH1, MSH2, MSH3, MSH6, MUTYH, NBN, NF1, NHTL1, PALB2, PDGFRA, PMS2, POLD1, POLE, PTEN, RAD50, RAD51C, RAD51D, RNF43, SDHB, SDHC, SDHD, SMAD4, SMARCA4. STK11, TP53, TSC1, TSC2, and VHL.  The following genes were evaluated for sequence changes only: SDHA and HOXB13 c.251G>A variant only.  (2) status post left modified radical mastectomy 12/19/2019 for a pT2 pN1, stage IIB invasive ductal carcinoma, grade 3, with negative margins.  (a) 1 of 13 axillary lymph nodes removed was positive  (3) adjuvant chemotherapy will consist of cyclophosphamide and doxorubicin in dose dense fashion x4 starting 01/22/2020 followed by weekly paclitaxel x12  (a) baseline echocardiogram 11/26/2019 shows an ejection fraction in the 65-70% range.  (b) first cycle of cyclophosphamide and doxorubicin dose reduced to assess tolerance  (c) chemotherapy discontinued after 2 cycles because of poor tolerance  (4) adjuvant radiation completed 05/21/2020  (5) anastrozole started July 2021   PLAN: Calista is now just about a year out from definitive surgery for her breast cancer with no evidence of disease recurrence.  This is favorable.  I do not know if the aches that she is feeling in her bones is due to the anastrozole or 2 bone metastatic disease or to other  causes.  We are going to hold the anastrozole for the next 4 weeks to see if that makes a difference.  I am also going to obtain a bone scan.  She is behind on mammography and I am setting her up for mammography next week.  We will have a virtual visit in 4weeks.  At that time we may resume anastrozole or switch to a different aromatase inhibitor  She knows to call for any other issue that may develop before then  Total encounter time 25 minutes.Sarajane Jews C. Maginat, MD 11/11/20 12:17 PM Medical Oncology and Hematology [facility] [place], [place] [address] Tel. [phone]    Fax. [phone]   I, Wilburn Mylar, am acting as scribe for Dr. Virgie [name]. [name].  I, Lurline Del MD, have reviewed the above documentation for accuracy and completeness, and I agree with the above.   *Total Encounter Time as defined by the Centers for Medicare and Medicaid Services includes, in addition to the face-to-face time of a patient visit (documented in the note above) non-face-to-face time: obtaining and reviewing outside history, ordering and reviewing medications, tests or procedures, care coordination (communications with other health care professionals or caregivers) and documentation in the medical record.

## 2020-11-11 ENCOUNTER — Inpatient Hospital Stay: Payer: Medicare Other

## 2020-11-11 ENCOUNTER — Other Ambulatory Visit: Payer: Self-pay

## 2020-11-11 ENCOUNTER — Inpatient Hospital Stay: Payer: Medicare Other | Attending: Oncology | Admitting: Oncology

## 2020-11-11 VITALS — BP 143/74 | HR 74 | Temp 97.7°F | Resp 17 | Ht 61.0 in | Wt 293.7 lb

## 2020-11-11 DIAGNOSIS — C50412 Malignant neoplasm of upper-outer quadrant of left female breast: Secondary | ICD-10-CM | POA: Insufficient documentation

## 2020-11-11 DIAGNOSIS — C779 Secondary and unspecified malignant neoplasm of lymph node, unspecified: Secondary | ICD-10-CM | POA: Diagnosis not present

## 2020-11-11 DIAGNOSIS — Z79899 Other long term (current) drug therapy: Secondary | ICD-10-CM | POA: Diagnosis not present

## 2020-11-11 DIAGNOSIS — Z823 Family history of stroke: Secondary | ICD-10-CM | POA: Diagnosis not present

## 2020-11-11 DIAGNOSIS — Z8041 Family history of malignant neoplasm of ovary: Secondary | ICD-10-CM | POA: Insufficient documentation

## 2020-11-11 DIAGNOSIS — Z818 Family history of other mental and behavioral disorders: Secondary | ICD-10-CM | POA: Diagnosis not present

## 2020-11-11 DIAGNOSIS — Z888 Allergy status to other drugs, medicaments and biological substances status: Secondary | ICD-10-CM | POA: Insufficient documentation

## 2020-11-11 DIAGNOSIS — Z170 Estrogen receptor positive status [ER+]: Secondary | ICD-10-CM | POA: Diagnosis not present

## 2020-11-11 DIAGNOSIS — Z8249 Family history of ischemic heart disease and other diseases of the circulatory system: Secondary | ICD-10-CM | POA: Insufficient documentation

## 2020-11-11 DIAGNOSIS — I10 Essential (primary) hypertension: Secondary | ICD-10-CM | POA: Diagnosis not present

## 2020-11-11 DIAGNOSIS — Z923 Personal history of irradiation: Secondary | ICD-10-CM | POA: Diagnosis not present

## 2020-11-11 DIAGNOSIS — Z8616 Personal history of COVID-19: Secondary | ICD-10-CM | POA: Insufficient documentation

## 2020-11-11 DIAGNOSIS — Z833 Family history of diabetes mellitus: Secondary | ICD-10-CM | POA: Insufficient documentation

## 2020-11-11 DIAGNOSIS — Z79811 Long term (current) use of aromatase inhibitors: Secondary | ICD-10-CM | POA: Insufficient documentation

## 2020-11-11 LAB — CBC WITH DIFFERENTIAL/PLATELET
Abs Immature Granulocytes: 0.01 10*3/uL (ref 0.00–0.07)
Basophils Absolute: 0 10*3/uL (ref 0.0–0.1)
Basophils Relative: 1 %
Eosinophils Absolute: 0.1 10*3/uL (ref 0.0–0.5)
Eosinophils Relative: 3 %
HCT: 28.6 % — ABNORMAL LOW (ref 36.0–46.0)
Hemoglobin: 9.3 g/dL — ABNORMAL LOW (ref 12.0–15.0)
Immature Granulocytes: 0 %
Lymphocytes Relative: 26 %
Lymphs Abs: 1.1 10*3/uL (ref 0.7–4.0)
MCH: 30.7 pg (ref 26.0–34.0)
MCHC: 32.5 g/dL (ref 30.0–36.0)
MCV: 94.4 fL (ref 80.0–100.0)
Monocytes Absolute: 0.5 10*3/uL (ref 0.1–1.0)
Monocytes Relative: 11 %
Neutro Abs: 2.5 10*3/uL (ref 1.7–7.7)
Neutrophils Relative %: 59 %
Platelets: 163 10*3/uL (ref 150–400)
RBC: 3.03 MIL/uL — ABNORMAL LOW (ref 3.87–5.11)
RDW: 14.1 % (ref 11.5–15.5)
WBC: 4.2 10*3/uL (ref 4.0–10.5)
nRBC: 0 % (ref 0.0–0.2)

## 2020-11-11 LAB — COMPREHENSIVE METABOLIC PANEL
ALT: 10 U/L (ref 0–44)
AST: 18 U/L (ref 15–41)
Albumin: 3.4 g/dL — ABNORMAL LOW (ref 3.5–5.0)
Alkaline Phosphatase: 51 U/L (ref 38–126)
Anion gap: 9 (ref 5–15)
BUN: 31 mg/dL — ABNORMAL HIGH (ref 8–23)
CO2: 31 mmol/L (ref 22–32)
Calcium: 9.6 mg/dL (ref 8.9–10.3)
Chloride: 99 mmol/L (ref 98–111)
Creatinine, Ser: 1.95 mg/dL — ABNORMAL HIGH (ref 0.44–1.00)
GFR, Estimated: 28 mL/min — ABNORMAL LOW (ref >60)
Glucose, Bld: 105 mg/dL — ABNORMAL HIGH (ref 70–99)
Potassium: 3.8 mmol/L (ref 3.5–5.1)
Sodium: 139 mmol/L (ref 135–145)
Total Bilirubin: 0.4 mg/dL (ref 0.3–1.2)
Total Protein: 7.9 g/dL (ref 6.5–8.1)

## 2020-11-12 ENCOUNTER — Telehealth: Payer: Self-pay | Admitting: Oncology

## 2020-11-12 NOTE — Telephone Encounter (Signed)
Scheduled appointment per 3/15 los. Called patient, no answer and vm was full. Mailed updated calendar to patient.

## 2020-11-16 ENCOUNTER — Other Ambulatory Visit: Payer: Self-pay | Admitting: Oncology

## 2020-11-19 ENCOUNTER — Other Ambulatory Visit: Payer: Self-pay | Admitting: Oncology

## 2020-12-08 ENCOUNTER — Other Ambulatory Visit: Payer: Self-pay

## 2020-12-08 ENCOUNTER — Encounter (HOSPITAL_COMMUNITY)
Admission: RE | Admit: 2020-12-08 | Discharge: 2020-12-08 | Disposition: A | Discharge status: Home / Self Care | Payer: Medicare Other | Source: Ambulatory Visit | Attending: Oncology | Admitting: Oncology

## 2020-12-08 DIAGNOSIS — C50412 Malignant neoplasm of upper-outer quadrant of left female breast: Secondary | ICD-10-CM | POA: Diagnosis not present

## 2020-12-08 DIAGNOSIS — Z170 Estrogen receptor positive status [ER+]: Secondary | ICD-10-CM | POA: Insufficient documentation

## 2020-12-08 DIAGNOSIS — C50919 Malignant neoplasm of unspecified site of unspecified female breast: Secondary | ICD-10-CM | POA: Diagnosis not present

## 2020-12-08 IMAGING — NM NM BONE WHOLE BODY
2 series · 2 of 2 positions shown · non-contrast
Comparison: None.

CLINICAL DATA: Breast carcinoma

EXAM:
NUCLEAR MEDICINE WHOLE BODY BONE SCAN
TECHNIQUE: Whole body anterior and posterior images were obtained approximately
3 hours after intravenous injection of radiopharmaceutical.
RADIOPHARMACEUTICALS:  20.9 mCi [04] MDP IV

[Series 1: whole body · 2.66mm/px · 1 of 1 slices shown (1 of 2)]
[im 1/1]
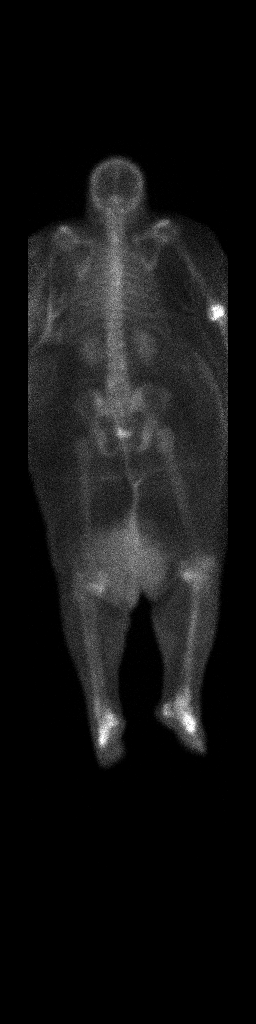

[Series 1: whole body · 2.66mm/px · 1 of 1 slices shown (2 of 2)]
[im 1/1]
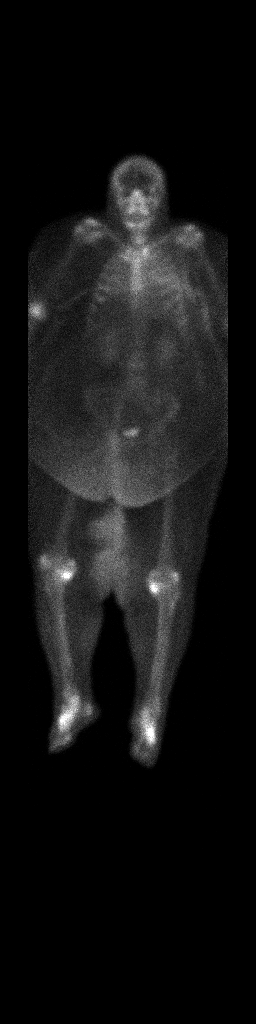

[2 of 2 positions shown; findings below may reference images not displayed]

FINDINGS: There are foci of increased uptake in the shoulders, knees, and
ankles, likely of arthropathic etiology. Radiotracer uptake
elsewhere is unremarkable. No findings are present which are felt to
be indicative of bony metastatic disease. Kidneys are noted in the
flank positions bilaterally.
IMPRESSION: No findings felt to be indicative of bony metastatic disease.
Arthropathy in major joints as noted.

## 2020-12-08 MED ORDER — TECHNETIUM TC 99M MEDRONATE IV KIT
20.9000 | PACK | Freq: Once | INTRAVENOUS | Status: AC | PRN
  Administered 2020-12-08: 20.9 via INTRAVENOUS

## 2020-12-11 ENCOUNTER — Ambulatory Visit: Payer: Medicare Other | Attending: Internal Medicine | Admitting: Internal Medicine

## 2020-12-11 ENCOUNTER — Encounter: Payer: Self-pay | Admitting: Internal Medicine

## 2020-12-11 ENCOUNTER — Other Ambulatory Visit: Payer: Self-pay

## 2020-12-11 VITALS — BP 154/81 | HR 57 | Resp 16 | Ht 61.0 in | Wt 287.8 lb

## 2020-12-11 DIAGNOSIS — I130 Hypertensive heart and chronic kidney disease with heart failure and stage 1 through stage 4 chronic kidney disease, or unspecified chronic kidney disease: Secondary | ICD-10-CM | POA: Diagnosis not present

## 2020-12-11 DIAGNOSIS — L602 Onychogryphosis: Secondary | ICD-10-CM

## 2020-12-11 DIAGNOSIS — Z780 Asymptomatic menopausal state: Secondary | ICD-10-CM | POA: Diagnosis not present

## 2020-12-11 DIAGNOSIS — Z0000 Encounter for general adult medical examination without abnormal findings: Secondary | ICD-10-CM | POA: Diagnosis not present

## 2020-12-11 DIAGNOSIS — Z124 Encounter for screening for malignant neoplasm of cervix: Secondary | ICD-10-CM

## 2020-12-11 DIAGNOSIS — M130 Polyarthritis, unspecified: Secondary | ICD-10-CM

## 2020-12-11 DIAGNOSIS — Z23 Encounter for immunization: Secondary | ICD-10-CM

## 2020-12-11 DIAGNOSIS — H538 Other visual disturbances: Secondary | ICD-10-CM

## 2020-12-11 DIAGNOSIS — N3946 Mixed incontinence: Secondary | ICD-10-CM

## 2020-12-11 DIAGNOSIS — Z1231 Encounter for screening mammogram for malignant neoplasm of breast: Secondary | ICD-10-CM

## 2020-12-11 DIAGNOSIS — Z1211 Encounter for screening for malignant neoplasm of colon: Secondary | ICD-10-CM

## 2020-12-11 DIAGNOSIS — Z6841 Body Mass Index (BMI) 40.0 and over, adult: Secondary | ICD-10-CM

## 2020-12-11 DIAGNOSIS — Z7189 Other specified counseling: Secondary | ICD-10-CM

## 2020-12-11 NOTE — Patient Instructions (Signed)
Go to the [employer] and get the phone number to schedule the COVID-19 vaccine booster shot.  I have submitted referral for you to get your mammogram for breast cancer screening and bone density study to screen for osteoporosis.  You can go over to the radiology department at [facility] at any time to get the x-rays done on your knees.  You can continue to use the Tylenol as needed for the knee pain.  I have submitted the referral for you to start the medical weight management program.

## 2020-12-11 NOTE — Progress Notes (Signed)
Subjective:    [name] is a 65 y.o. female who presents for a Welcome to Medicare exam.  Patient with history of HTN, CHFpEF, thoracic aortic aneurysm (Dr. Gasper Sells), EtOH use disorder, CKD 3, breast CA LT ERP (10/2019 mastectomy, 2 rounds chem, XRT, on anastrazole Dr. Jana Hakim), morbid obesity, gout, ACD, obesity   Review of Systems MSK:  Aches all over sometimes but pain concentrated in knees, elbows.  No swelling in elbows but swelling in legs.  Takes Tylenol which helps a little. Has to sit in her recliner.  Has WC which she uses in the kitchen to sit and cook.  Otherwise she uses her walker.  Grand-daughter lives with her and helps her out with house work, cooking and reminding her to take meds -no recent falls. -Her oncologist recently had her discontinue Arimidex to see whether the polyarthralgia was being caused by this medication.  Did bone scan which was negative for any bony metastasis.  Arthropathy in major joints were noted. Eyes: Endorses blurred vision bilaterally for years.  She wears over-the-counter readers.  She has not had an eye exam in over 5 years. GI: Referred for colonoscopy on last visit.  She has not been called as yet for the appointment.  Reports she is moving her bowels okay.  No blood in the stools. -She continues to try to limit portion sizes.  She would like to get her weight down; would like wgh reduction surgery.  Not as active as she would like to be because of pain in the knees and issues with mobility CVS: BP elevated but she has not taken medicines as yet for the morning.  She checks blood pressure at home every day.  Gives range of 125-130/75-82.  She tells me that she is not taking furosemide.  Endorses lower extremity edema.  No PND orthopnea. Recent BMP showed worsening creat of 1.95 up from 1.32 and GFR decreased from 45 to 28.  Not on NSAIDs Objective:    Today's Vitals   12/11/20 0952  BP: (!) 154/81  Pulse: (!) 57  Resp: 16  SpO2:  99%  Weight: 287 lb 12.8 oz (130.5 kg)  Height: 5\' 1"  (1.549 m)  Body mass index is 54.38 kg/m. Wt Readings from Last 3 Encounters:  12/11/20 287 lb 12.8 oz (130.5 kg)  11/11/20 293 lb 11.2 oz (133.2 kg)  10/16/20 286 lb 6.4 oz (129.9 kg)    Medications Outpatient Encounter Medications as of 12/11/2020  Medication Sig  . ascorbic acid (VITAMIN C) 500 MG tablet Take 1 tablet (500 mg total) by mouth daily.  Marland Kitchen aspirin EC 81 MG EC tablet Take 1 tablet (81 mg total) by mouth daily. Swallow whole.  . Biotin 10000 MCG TABS Take by mouth.  . carvedilol (COREG) 12.5 MG tablet Take 12.5 mg by mouth 2 (two) times daily.  . folic acid (FOLVITE) 1 MG tablet Take 1 tablet (1 mg total) by mouth daily.  Marland Kitchen gabapentin (NEURONTIN) 100 MG capsule TAKE 1 CAPSULE BY MOUTH TWICE A DAY  . Magnesium Oxide 400 MG CAPS Take 1 capsule (400 mg total) by mouth daily.  . Multiple Vitamin (MULTIVITAMIN WITH MINERALS) TABS tablet Take 1 tablet by mouth daily.  Marland Kitchen anastrozole (ARIMIDEX) 1 MG tablet Take 1 tablet (1 mg total) by mouth daily. (Patient not taking: Reported on 12/11/2020)  . Blood Pressure Monitor DEVI Use as instructed by provider to check bloos pressure daily. ICD: I10 (Patient not taking: Reported on 12/11/2020)  .  furosemide (LASIX) 20 MG tablet Take 1 tablet (20 mg total) by mouth daily. (Patient not taking: Reported on 12/11/2020)  . gabapentin (NEURONTIN) 300 MG capsule Take 1 capsule (300 mg total) by mouth at bedtime. (Patient not taking: Reported on 12/11/2020)  . lidocaine-prilocaine (EMLA) cream Apply 1 application topically as needed. (Patient not taking: Reported on 12/11/2020)  . nystatin (MYCOSTATIN/NYSTOP) powder Apply topically 3 (three) times daily. Apply to under panus. (Patient not taking: Reported on 12/11/2020)  . thiamine 100 MG tablet Take 1 tablet (100 mg total) by mouth daily. (Patient not taking: Reported on 12/11/2020)   No facility-administered encounter medications on file as of  12/11/2020.     History: Past Medical History:  Diagnosis Date  . Arthritis   . Cancer ([place]) 11/2019   Left breast  . Diverticulitis   . Family history of ovarian cancer   . Heart murmur    11/26/19 echo: Mild AS. AV mean gradient 12.0 mmHg; however, LVOT gradient 8 mmHg with intracvitary gradient-significant AS is not suspected  . Hypertension    Past Surgical History:  Procedure Laterality Date  . MASTECTOMY WITH AXILLARY LYMPH NODE DISSECTION Left 12/19/2019   Procedure: LEFT MASTECTOMY WITH AXILLARY LYMPH NODE DISSECTION;  Surgeon: [name], MD;  Location: [facility];  Service: General;  Laterality: Left;  Marland Kitchen MULTIPLE EXTRACTIONS WITH ALVEOLOPLASTY Bilateral 12/14/2019   Procedure: MULTIPLE EXTRACTION WITH ALVEOLOPLASTY;  Surgeon: [name], DDS;  Location: [facility];  Service: Oral Surgery;  Laterality: Bilateral;  . PORTACATH PLACEMENT N/A 12/19/2019   Procedure: INSERTION PORT-A-CATH WITH ULTRASOUND GUIDANCE;  Surgeon: [name], MD;  Location: [facility];  Service: General;  Laterality: N/A;  . RADIAL HEAD ARTHROPLASTY Right 06/25/2019   Procedure: RIGHT RADIAL HEAD ARTHROPLASTY;  Surgeon: [name], MD;  Location: [place];  Service: Orthopedics;  Laterality: Right;  . TUBAL LIGATION      Family History  Problem Relation Age of Onset  . Hypertension Mother   . Dementia Mother   . Ovarian cancer Half-Sister 68  . Diabetes Half-Sister   . Stroke Half-Sister    Social History   Occupational History  . Not on file  Tobacco Use  . Smoking status: Never Smoker  . Smokeless tobacco: Never Used  Vaping Use  . Vaping Use: Never used  Substance and Sexual Activity  . Alcohol use: Yes    Alcohol/week: 3.0 standard drinks    Types: 1 Cans of beer, 2 Shots of liquor per week    Comment: daily  . Drug use: Yes    Types: Marijuana  . Sexual activity: Not Currently    Tobacco Counseling -never smoked  Immunizations and Health Maintenance Immunization History   Administered Date(s) Administered  . Influenza,inj,Quad PF,6+ Mos 06/15/2019, 05/12/2020  . Moderna Sars-Covid-2 Vaccination 12/08/2019, 01/05/2020  . Pneumococcal Conjugate-13 10/16/2020  . Pneumococcal Polysaccharide-23 11/04/2013  . Tdap 01/10/2019  . Zoster Recombinat (Shingrix) 12/11/2020   -Due for COVID booster.  Advised to go to the [employer] and get the phone number to schedule appointment for COVID booster.  She does have a personal electronic template -Agreeable to receiving first dose of shingles vaccine today.  Health Maintenance Due  Topic Date Due  . COLONOSCOPY (Pts 45-62yrs Insurance coverage will need to be confirmed)  Never done  . PAP SMEAR-Modifier  01/26/2009  . COVID-19 Vaccine (3 - Moderna risk 4-dose series) 02/02/2020  . DEXA SCAN  Never done    Activities of Daily Living In your  present state of health, do you have any difficulty performing the following activities: 12/11/2020 08/01/2020  Hearing? N N  Vision? N N  Difficulty concentrating or making decisions? N N  Walking or climbing stairs? Y Y  Comment - secondary to weakness and shortness of breath  Dressing or bathing? N Y  Doing errands, shopping? N Y  Conservation officer, nature and eating ? N -  Using the Toilet? N -  In the past six months, have you accidently leaked urine? Y -  Do you have problems with loss of bowel control? N -  Managing your Medications? N -  Managing your Finances? N -  Housekeeping or managing your Housekeeping? N -  Some recent data might be hidden  -She has problems climbing stairs but does not have any stairs in her house.  Does have a porch outside her house but does not have railings around it. -Reports having a shower chair, shower bar.  She has an outside ramp.  Reports that she is never alone at home, family member always with her.  Does not drive and depends on medical transportation. -Admits to urinary incontinence at times due to limited mobility.  When she gets  the urge to urinate she is unable to hold it at times and has leakage.  Also has some leakage when she coughs or sneezes.  She wears pads and prefers the pads to Depends  Physical Exam  (optional), or other factors deemed appropriate based on the beneficiary's medical and social history and current clinical standards. General: Obese elderly female sitting in wheelchair in NAD Psy: Patient answers questions appropriately.  Mood is stable.  Good eye contact. Eyes: Nonicteric sclera.  Pink conjunctiva.  EOMI Mouth: Moist oral mucosa.  Throat without erythema or exudates. Neck: Supple.  No cervical lymphadenopathy.  No thyroid enlargement or nodules appreciated. Chest: Breath sounds slightly decreased but without wheezes or crackles. CVS: Regular rate and rhythm. Abdomen: Patient has a large pannus that over hangs groin. Extremities: 1+ edema in both lower legs.  The legs are sore to touch. Skin: Pannus of the lower abdomen.  No signs of fungal infection noted under the abdominal skin fold.  She has folds of skin of both upper thigh and lower legs due to obesity.  Skin in these areas are moist but no signs of intertrigo at this time. Hyperpigmentation of skin around the posterior and lateral neckline.  Caked up dried skin on dorsal surface feet proximal to the toes. Nails: Toenails are thick and overgrown. MSK: No edema or erythema noted of the elbows.  No point tenderness of the elbows.  Good passive range of motion.  Knees: Large body habitus.  She has mild discomfort with passive range of motion.  Advanced Directives: Does Patient Have a Medical Advance Directive?: No Would patient like information on creating a medical advance directive?: Yes (Inpatient - patient defers creating a medical advance directive at this time - Information given)    Assessment:    This is a routine wellness examination for this patient .   Vision/Hearing screen Whisper test normal bilaterally.  Hearing Screening    125Hz  250Hz  500Hz  1000Hz  2000Hz  3000Hz  4000Hz  6000Hz  8000Hz   Right ear:           Left ear:             Visual Acuity Screening   Right eye Left eye Both eyes  Without correction: 20 70 20 50 20 50  With correction:  Dietary issues and exercise activities discussed:  See discussion above.    Goals    . Medication Management     Medicaid Managed Care CARE PLAN ENTRY (see longitudinal plan of care for additional care plan information)  Current Barriers:  . Social, financial, community barriers:  . Patient with complex health conditions multiple comorbidities including Covid-19, HTN . Self-manages medications. Does not use a pill box or other adherence strategies . Pharmacy  [id number] - [place], [place] [address] Phone: [phone] Fax: [phone]     Pharmacist Clinical Goal(s):  Marland Kitchen Over the next 365 days, patient will work with PharmD and provider towards optimized medication management  Interventions: . Comprehensive medication review performed; medication list updated in electronic medical record . Inter-disciplinary care team collaboration (see longitudinal plan of care)  Patient is on very few legend medications and, other than recovering from Covid, has no needs at this time   Patient Self Care Activities:  . Patient will take medications as prescribed . Patient will focus on improved adherence by January   Initial goal documentation  [place] Constable, Pharm.D., Managed Medicaid Pharmacist - [phone]        Depression Screen PHQ 2/9 Scores 12/11/2020 10/16/2020 06/27/2020 05/12/2020  PHQ - 2 Score 0 0 0 0  PHQ- 9 Score - - 2 4  Exception Documentation - - - -     Fall Risk Fall Risk  12/11/2020  Falls in the past year? 0  Number falls in past yr: 0  Injury with Fall? 0  Risk for fall due to : -  Follow up -    Cognitive Function: MMSE -  Mini Mental State Exam 12/11/2020  Orientation to time 5  Orientation to Place 5  Registration 3  Attention/ Calculation 5  Recall 3  Language- name 2 objects 2  Language- repeat 1  Language- follow 3 step command 3  Language- read & follow direction 1  Write a sentence 1  Copy design 0  Total score 29        Patient Care Team: Ladell Pier, MD as PCP - General (Internal Medicine) Mauro Kaufmann, RN as Oncology Nurse Navigator Rockwell Germany, RN as Oncology Nurse Navigator [name], MD as Consulting Physician (General Surgery) Magrinat, Virgie Dad, MD as Consulting Physician (Oncology) Kyung Rudd, MD as Consulting Physician ([place]) Edrick Kins, DPM as Consulting Physician (Podiatry) [name] as Social Worker Lane Hacker, [facility] as Pharmacist (Pharmacist) Rudean Haskell ,MD -cardiologist  Substance abuse screening: -She does not smoke. -She uses marijuana about twice a month which she states helps with her joint pains -She has not drank since December of last year.  She maintains her sobriety.    Plan:  1. Encounter for Medicare annual wellness exam -Advised patient to speak with her landlord about putting her willing around her porch for safety and to help prevent falls -Encouraged to get COVID-19 booster.  She will go to the [facility] website and get the phone number to call to schedule a vaccine appointment. -Given first shot of Shingrix today. -Agreeable to osteoporosis screening with bone density study.  This was ordered.  2. Advance directive discussed with patient Patient wanted information about advanced directive.  We discussed what is an advanced directive and components that can make up with advanced directive including a living will and healthcare power of attorney.  Patient states  that she was discussing getting an advanced directive with her son several days ago.  Patient given packet to take home with her.   Recommend that if she does a living will or healthcare power of attorney, she keep a copy of the document for herself, give a copy to whoever will be her healthcare power of attorney and consider giving [place] a copy placed on her record.  3. Polyarthritis -Discussed the importance of weight loss to get mechanical strain of her knees. Continue Tylenol for now.  Unable to use NSAIDs due to kidney function. We will get baseline imaging studies of the knees.  Based on results, we will decide on referring to orthopedics versus some physical therapy. - DG Knee Complete 4 Views Left; Future - DG Knee Complete 4 Views Right; Future  4. Morbid obesity with BMI of 50.0-59.9, adult [facility]) Discussed and encourage healthy eating habits.  Given her chronic medical issues she may not be a good candidate for weight reduction surgery.  I recommend trying medical weight management and patient was agreeable to referral.  She will use medical transportation to get back and forth to appointments. - Amb Ref to Medical Weight Management  5. Hypertensive heart and kidney disease with HF and with CKD stage IV (HCC) Blood pressure elevated but she has not taken medicine as yet for today. Home BP readings reported are good.  Continue Coreg. Given the lower extremity edema,  I recommend that she restart the furosemide.  I will have my staff fax results of recent chemistry done by her oncologist to Dr. Justin Mend as she has had drop in GFR. Avoid NSAIDs  6. Blurred vision, bilateral - Ambulatory referral to Ophthalmology  7. Urinary incontinence, mixed Combination of functional, stress and urge incontinence.  We will not put her on medication at this time to avoid polypharmacy.  She will continue to use incontinence pads  8. Overgrown nail Recommend referral to her podiatrist to have nails clipped. - Ambulatory referral to Podiatry  9. Need for shingles vaccine - Varicella-zoster vaccine IM (Shingrix)  10. COVID-19 vaccine  series started See discussion in #1 above  11. Encounter for screening mammogram for malignant neoplasm of breast - MM Digital Screening; Future  12. Screening for colon cancer Message sent to our referral coordinator asking her to check into the appointment with the gastroenterologist.  13. Postmenopausal estrogen deficiency - DG Bone Density; Future  14.  Patient has not had Pap smear in over 20 years.  She just turned 66 in December.  I think she needs at least one Pap smear given that she has not had regular and routine screening in the past 20 years.  She is agreeable for me to submit referral to gynecology for this.  I have personally reviewed and noted the following in the patient's chart:   . Medical and social history . Use of alcohol, tobacco or illicit drugs  . Current medications and supplements . Functional ability and status . Nutritional status . Physical activity . Advanced directives . List of other physicians . Hospitalizations, surgeries, and ER visits in previous 12 months . Vitals . Screenings to include cognitive, depression, and falls . Referrals and appointments  In addition, I have reviewed and discussed with patient certain preventive protocols, quality metrics, and best practice recommendations. A written personalized care plan for preventive services as well as general preventive health recommendations were provided to patient.     Karle Plumber, MD 12/12/2020

## 2020-12-15 ENCOUNTER — Telehealth: Payer: Self-pay

## 2020-12-15 NOTE — Telephone Encounter (Signed)
Thank you, I will call her and sched ov.

## 2020-12-15 NOTE — Telephone Encounter (Signed)
[name],  Due to BMI this pt needs to have their procedure in the hospital.  Thanks,  [name]

## 2020-12-15 NOTE — Telephone Encounter (Signed)
This pt has a BMI of 54.41, should I schedule an ov or would you like the pt to be direct at [facility]? Thank you, Etheleen Nicks, RN

## 2020-12-15 NOTE — Telephone Encounter (Signed)
Di Kindle, I agree with Jenny Reichmann in that if colonoscopy is pursued it must be done at the hospital due to BMI. I would recommend an office visit first for her however if she is not yet established to discuss her options and see how she wishes to proceed. Can be booked with me or any of the other docs or APPs. thanks

## 2020-12-17 ENCOUNTER — Telehealth: Payer: Self-pay | Admitting: Internal Medicine

## 2020-12-17 NOTE — Progress Notes (Signed)
[place]  [phone] [phone]     ID: [name] DOB: 28-Apr-1956  MR#: [id number]  TKW#:[id number]  Patient Care Team: Sue Pier, MD as PCP - General (Internal Medicine) Sue Kaufmann, RN as Oncology Nurse Navigator Sue Germany, RN as Oncology Nurse Navigator Sue Keens, MD as Consulting Physician (General Surgery) [name], Sue Dad, MD as Consulting Physician (Oncology) Sue Rudd, MD as Consulting Physician (Radiation Oncology) [name], DPM as Consulting Physician (Podiatry) [name] as Social Worker [name], [facility] as Pharmacist (Pharmacist) [name] OTHER MD:  I connected with [name] on 12/17/20 at  9:15 AM EDT by telephone visit and verified that I am speaking with the correct person using two identifiers.   I discussed the limitations, risks, security and privacy concerns of performing an evaluation and management service by telemedicine and the availability of in-person appointments. I also discussed with the patient that there may be a patient responsible charge related to this service. The patient expressed understanding and agreed to proceed.   Other persons participating in the visit and their role in the encounter: None  Patient's location: Home Provider's location: [place]  Total time spent: 15 min   CHIEF COMPLAINT: estrogen receptor positive breast cancer (s/p left mastectomy)  CURRENT TREATMENT: anastrozole   INTERVAL HISTORY: Sue was contacted today for follow up of her estrogen receptor positive breast cancer.  She went off and anastrozole temporarily to see if that made a difference to her aches and pains but she still has all the aches and pains so it was in the anastrozole and she is willing to go back on that medication.  Since her last visit, she underwent bone scan on 12/08/2020 showing: no findings felt to be indicative  of bony metastatic disease; arthropathy in major joints as noted.  She is due for annual mammography.   REVIEW OF SYSTEMS: [name] is looking forward to the summer when she can spend time with her grandchildren.  She tells me a referral to rheumatology is being placed by her primary care physician to evaluate her aches and pains.  She also has been referred to the weight loss program through [facility] and she is eager to participate on that.  She says her sugars are currently well controlled.   COVID 19 VACCINATION STATUS: Status post Moderna x2, with the booster October 2021; infection early 2020 and December 2021; second booster scheduled for April 2022   HISTORY OF CURRENT ILLNESS: From the original intake note:  [name] (pronounced "yellow") had routine screening mammography on 10/24/2019 showing a possible abnormality in the left breast. She underwent left breast ultrasonography at [facility] on 11/05/2019 showing: suspicious 3.8 cm mass in left breast at 3 o'clock; five abnormal lymph nodes.  Accordingly on 11/12/2019 she proceeded to biopsy of the left breast area in question as well as a lymph node. The pathology from this procedure ([id number]) showed: invasive ductal carcinoma with prominent inflammatory response, grade 3. Prognostic indicators significant for: estrogen receptor, 100% positive and progesterone receptor, 50% positive, both with strong staining intensity. Proliferation marker Ki67 at 70%. HER2 negative by immunohistochemistry (0).  The biopsied lymph node confirmed metastatic breast carcinoma.  The patient's subsequent history is as detailed below.   PAST MEDICAL HISTORY: Past Medical History:  Diagnosis Date  . Arthritis   . Cancer ([place]) 11/2019   Left breast  . Diverticulitis   . Family history  of ovarian cancer   . Heart murmur    11/26/19 echo: Mild AS. AV mean gradient 12.0 mmHg; however, LVOT gradient 8 mmHg with intracvitary  gradient-significant AS is not suspected  . Hypertension     PAST SURGICAL HISTORY: Past Surgical History:  Procedure Laterality Date  . MASTECTOMY WITH AXILLARY LYMPH NODE DISSECTION Left 12/19/2019   Procedure: LEFT MASTECTOMY WITH AXILLARY LYMPH NODE DISSECTION;  Surgeon: Sue Keens, MD;  Location: [facility];  Service: General;  Laterality: Left;  Marland Kitchen MULTIPLE EXTRACTIONS WITH ALVEOLOPLASTY Bilateral 12/14/2019   Procedure: MULTIPLE EXTRACTION WITH ALVEOLOPLASTY;  Surgeon: [name], DDS;  Location: [facility];  Service: Oral Surgery;  Laterality: Bilateral;  . PORTACATH PLACEMENT N/A 12/19/2019   Procedure: INSERTION PORT-A-CATH WITH ULTRASOUND GUIDANCE;  Surgeon: Sue Keens, MD;  Location: [facility];  Service: General;  Laterality: N/A;  . RADIAL HEAD ARTHROPLASTY Right 06/25/2019   Procedure: RIGHT RADIAL HEAD ARTHROPLASTY;  Surgeon: [name], MD;  Location: [facility];  Service: Orthopedics;  Laterality: Right;  . TUBAL LIGATION      FAMILY HISTORY: Family History  Problem Relation Age of Onset  . Hypertension Mother   . Dementia Mother   . Ovarian cancer Half-Sister 2  . Diabetes Half-Sister   . Stroke Half-Sister   Her father died at age 22; she is unsure of the cause of death. Her mother is age 59 as of 10/2019.  The patient has two brothers and four sisters. She reports ovarian cancer in her sister at age 81. She denies a family history of breast, prostate, or pancreatic cancer.   GYNECOLOGIC HISTORY:  No LMP recorded. Patient is postmenopausal. Menarche: 65 years old Age at first live birth: 65 years old [place] 2 LMP around age 66 Contraceptive: used previously HRT never used  Hysterectomy? no BSO? no   SOCIAL HISTORY: (updated11/2021)  [name] retired from working at the [employer]. She is divorced. She lives at home  currently with her granddaughter [name]. [name], age 78, lives in [place] and is handicapped. [name], age 3, is a Biomedical scientist here in  [place]. [name] has two granddaughters. She attends a local [employer].    ADVANCED DIRECTIVES: not in place; at the 11/21/2019 visit the patient was given the appropriate documents to complete and notarized at her discretion.  She expressed spontaneously that she would not want to be resuscitated in case of a terminal event.    HEALTH MAINTENANCE: Social History   Tobacco Use  . Smoking status: Never Smoker  . Smokeless tobacco: Never Used  Vaping Use  . Vaping Use: Never used  Substance Use Topics  . Alcohol use: Yes    Alcohol/week: 3.0 standard drinks    Types: 1 Cans of beer, 2 Shots of liquor per week    Comment: daily  . Drug use: Yes    Types: Marijuana     Colonoscopy: never done  PAP: "1980?"  Bone density: never done   Allergies  Allergen Reactions  . Amlodipine Swelling    BLE edema    Current Outpatient Medications  Medication Sig Dispense Refill  . anastrozole (ARIMIDEX) 1 MG tablet Take 1 tablet (1 mg total) by mouth daily. (Patient not taking: Reported on 12/11/2020) 90 tablet 4  . ascorbic acid (VITAMIN C) 500 MG tablet Take 1 tablet (500 mg total) by mouth daily. 30 tablet 0  . aspirin EC 81 MG EC tablet Take 1 tablet (81 mg total) by mouth daily. Swallow  whole. 30 tablet 11  . Biotin 10000 MCG TABS Take by mouth.    . Blood Pressure Monitor DEVI Use as instructed by provider to check bloos pressure daily. ICD: I10 (Patient not taking: Reported on 12/11/2020) 1 each 0  . carvedilol (COREG) 12.5 MG tablet Take 12.5 mg by mouth 2 (two) times daily.    . folic acid (FOLVITE) 1 MG tablet Take 1 tablet (1 mg total) by mouth daily. 30 tablet 0  . furosemide (LASIX) 20 MG tablet Take 1 tablet (20 mg total) by mouth daily. (Patient not taking: Reported on 12/11/2020) 90 tablet 3  . gabapentin (NEURONTIN) 100 MG capsule TAKE 1 CAPSULE BY MOUTH TWICE A DAY 60 capsule 1  . gabapentin (NEURONTIN) 300 MG capsule Take 1 capsule (300 mg total) by mouth at bedtime.  (Patient not taking: Reported on 12/11/2020) 90 capsule 6  . lidocaine-prilocaine (EMLA) cream Apply 1 application topically as needed. (Patient not taking: Reported on 12/11/2020) 30 g 0  . Magnesium Oxide 400 MG CAPS Take 1 capsule (400 mg total) by mouth daily. 90 capsule 3  . Multiple Vitamin (MULTIVITAMIN WITH MINERALS) TABS tablet Take 1 tablet by mouth daily.    Marland Kitchen nystatin (MYCOSTATIN/NYSTOP) powder Apply topically 3 (three) times daily. Apply to under panus. (Patient not taking: Reported on 12/11/2020) 60 g 0  . thiamine 100 MG tablet Take 1 tablet (100 mg total) by mouth daily. (Patient not taking: Reported on 12/11/2020) 30 tablet 0   No current facility-administered medications for this visit.    OBJECTIVE:  There were no vitals filed for this visit.   There is no height or weight on file to calculate BMI.   Wt Readings from Last 3 Encounters:  12/11/20 287 lb 12.8 oz (130.5 kg)  11/11/20 293 lb 11.2 oz (133.2 kg)  10/16/20 286 lb 6.4 oz (129.9 kg)   Telemedicine visit 12/18/2020   LAB RESULTS:  CMP     Component Value Date/Time   NA 139 11/11/2020 1122   NA 138 06/26/2020 0843   K 3.8 11/11/2020 1122   CL 99 11/11/2020 1122   CO2 31 11/11/2020 1122   GLUCOSE 105 (H) 11/11/2020 1122   BUN 31 (H) 11/11/2020 1122   BUN 23 06/26/2020 0843   CREATININE 1.95 (H) 11/11/2020 1122   CREATININE 1.59 (H) 11/21/2019 0908   CALCIUM 9.6 11/11/2020 1122   PROT 7.9 11/11/2020 1122   PROT 8.5 06/15/2019 1622   ALBUMIN 3.4 (L) 11/11/2020 1122   ALBUMIN 4.3 06/15/2019 1622   AST 18 11/11/2020 1122   AST 61 (H) 11/21/2019 0908   ALT 10 11/11/2020 1122   ALT 32 11/21/2019 0908   ALKPHOS 51 11/11/2020 1122   BILITOT 0.4 11/11/2020 1122   BILITOT 0.5 11/21/2019 0908   GFRNONAA 28 (L) 11/11/2020 1122   GFRNONAA 34 (L) 11/21/2019 0908   GFRAA 40 (L) 06/26/2020 0843   GFRAA 39 (L) 11/21/2019 0908    Lab Results  Component Value Date   TOTALPROTELP 6.9 06/02/2020   ALBUMINELP 3.3  06/02/2020   A1GS 0.2 06/02/2020   A2GS 0.7 06/02/2020   BETS 0.8 06/02/2020   GAMS 1.8 06/02/2020   MSPIKE Not Observed 06/02/2020   SPEI Comment 06/02/2020    Lab Results  Component Value Date   WBC 4.2 11/11/2020   NEUTROABS 2.5 11/11/2020   HGB 9.3 (L) 11/11/2020   HCT 28.6 (L) 11/11/2020   MCV 94.4 11/11/2020   PLT 163 11/11/2020  No results found for: LABCA2  No components found for: SKAJGO115  No results for input(s): INR in the last 168 hours.  No results found for: LABCA2  No results found for: CAN199  No results found for: CAN125  No results found for: BWI203  Lab Results  Component Value Date   CA2729 10.6 05/15/2020    No components found for: HGQUANT  No results found for: CEA1 / No results found for: CEA1   No results found for: AFPTUMOR  No results found for: [facility]  Lab Results  Component Value Date   KPAFRELGTCHN 131.4 (H) 06/02/2020   LAMBDASER 62.0 (H) 06/02/2020   KAPLAMBRATIO 2.12 (H) 06/02/2020   (kappa/lambda light chains)  No results found for: HGBA, HGBA2QUANT, HGBFQUANT, HGBSQUAN (Hemoglobinopathy evaluation)   Lab Results  Component Value Date   LDH 190 07/31/2020    Lab Results  Component Value Date   IRON 130 06/01/2020   TIBC 133 (L) 06/01/2020   IRONPCTSAT 97 (H) 06/01/2020   (Iron and TIBC)  Lab Results  Component Value Date   FERRITIN 1,082 (H) 07/31/2020    Urinalysis    Component Value Date/Time   COLORURINE YELLOW 05/31/2020 1923   APPEARANCEUR CLEAR 05/31/2020 1923   LABSPEC 1.012 05/31/2020 1923   PHURINE 5.0 05/31/2020 1923   GLUCOSEU NEGATIVE 05/31/2020 1923   HGBUR NEGATIVE 05/31/2020 1923   BILIRUBINUR NEGATIVE 05/31/2020 [place] NEGATIVE 05/31/2020 1923   PROTEINUR 100 (A) 05/31/2020 1923   UROBILINOGEN 1.0 07/14/2012 1630   NITRITE NEGATIVE 05/31/2020 1923   LEUKOCYTESUR NEGATIVE 05/31/2020 1923     STUDIES: NM Bone Scan Whole Body  Result Date:  12/10/2020 CLINICAL DATA:  Breast carcinoma EXAM: NUCLEAR MEDICINE WHOLE BODY BONE SCAN TECHNIQUE: Whole body anterior and posterior images were obtained approximately 3 hours after intravenous injection of radiopharmaceutical. RADIOPHARMACEUTICALS:  20.9 mCi Technetium-14mMDP IV COMPARISON:  None. FINDINGS: There are foci of increased uptake in the shoulders, knees, and ankles, likely of arthropathic etiology. Radiotracer uptake elsewhere is unremarkable. No findings are present which are felt to be indicative of bony metastatic disease. Kidneys are noted in the flank positions bilaterally. IMPRESSION: No findings felt to be indicative of bony metastatic disease. Arthropathy in major joints as noted. Electronically Signed   By: WLowella GripIII M.D.   On: 12/10/2020 08:50     ELIGIBLE FOR AVAILABLE RESEARCH PROTOCOL: AET  ASSESSMENT: 65y.o. [place] woman status post left breast upper outer quadrant and left axillary lymph node biopsies 11/12/2019 for a clinical T2 N2, stage IIIA invasive ductal carcinoma, grade 3, estrogen and progesterone receptor positive, HER-2 not amplified, with an MIB-1 of 70%  (a) the biopsied axillary lymph node was positive  (b) bone scan 12/08/2020 showed no metastatic disease  (1) genetics testing 11/29/2019 through the Breast Cancer STAT Panel offered by [facility] found no deleterious mutations in ATM, BRCA1, BRCA2, CDH1, CHEK2, PALB2, PTEN, STK11 and TP53.  Additional testing through the Common Hereditary Cancers Panel confirmed no deleterious mutations in APC, ATM, AXIN2, BARD1, BMPR1A, BRCA1, BRCA2, BRIP1, CDH1, CDK4, CDKN2A (p14ARF), CDKN2A (p16INK4a), CHEK2, CTNNA1, DICER1, EPCAM (Deletion/duplication testing only), GREM1 (promoter region deletion/duplication testing only), KIT, MEN1, MLH1, MSH2, MSH3, MSH6, MUTYH, NBN, NF1, NHTL1, PALB2, PDGFRA, PMS2, POLD1, POLE, PTEN, RAD50, RAD51C, RAD51D, RNF43, SDHB, SDHC, SDHD, SMAD4, SMARCA4. STK11, TP53, TSC1, TSC2, and  VHL.  The following genes were evaluated for sequence changes only: SDHA and HOXB13 c.251G>A variant only.  (2) status post left modified radical  mastectomy 12/19/2019 for a pT2 pN1, stage IIB invasive ductal carcinoma, grade 3, with negative margins.  (a) 1 of 13 axillary lymph nodes removed was positive  (3) adjuvant chemotherapy will consist of cyclophosphamide and doxorubicin in dose dense fashion x4 starting 01/22/2020 followed by weekly paclitaxel x12  (a) baseline echocardiogram 11/26/2019 shows an ejection fraction in the 65-70% range.  (b) first cycle of cyclophosphamide and doxorubicin dose reduced to assess tolerance  (c) chemotherapy discontinued after 2 cycles because of poor tolerance  (4) adjuvant radiation completed 05/21/2020  (5) anastrozole started July 2021   PLAN: Janellie is now a year out from definitive surgery for her breast cancer with no evidence of disease recurrence.  This is very favorable.  I do think she has mostly arthritis and problems related to her morbid obesity to explain her pain.  We are accordingly going back to anastrozole and the plan is to continue that for a total of 5 years.  She has mammography and a bone density pending.  I will see her again in 6 months.  She knows to call for any other issue that may develop before then.   Sue Dad. Maginat, MD 12/17/20 1:54 PM Medical Oncology and Hematology [facility] [place], [place] [address] Tel. [phone]    Fax. [phone]   I, Wilburn Mylar, am acting as scribe for Dr. Virgie Dad. [name].  I, Lurline Del MD, have reviewed the above documentation for accuracy and completeness, and I agree with the above.   *Total Encounter Time as defined by the Centers for Medicare and Medicaid Services includes, in addition to the face-to-face time of a patient visit (documented in the note above) non-face-to-face time: obtaining and reviewing outside history,  ordering and reviewing medications, tests or procedures, care coordination (communications with other health care professionals or caregivers) and documentation in the medical record.

## 2020-12-17 NOTE — Telephone Encounter (Signed)
-----   Message from Ena Dawley sent at 12/16/2020  1:26 PM EDT ----- Kermit Balo Afternoon Patient schedule   12/20/2020 @ 2:30pm   [place] Fabienne Bruns   ----- Message ----- From: Ladell Pier, MD Sent: 12/12/2020  10:39 AM EDT To: Ena Dawley  Pt has not received appt for colonoscopy

## 2020-12-18 ENCOUNTER — Inpatient Hospital Stay: Payer: Medicare Other | Attending: Oncology | Admitting: Oncology

## 2020-12-18 DIAGNOSIS — Z833 Family history of diabetes mellitus: Secondary | ICD-10-CM | POA: Insufficient documentation

## 2020-12-18 DIAGNOSIS — Z8041 Family history of malignant neoplasm of ovary: Secondary | ICD-10-CM | POA: Insufficient documentation

## 2020-12-18 DIAGNOSIS — Z818 Family history of other mental and behavioral disorders: Secondary | ICD-10-CM | POA: Insufficient documentation

## 2020-12-18 DIAGNOSIS — Z823 Family history of stroke: Secondary | ICD-10-CM | POA: Diagnosis not present

## 2020-12-18 DIAGNOSIS — C773 Secondary and unspecified malignant neoplasm of axilla and upper limb lymph nodes: Secondary | ICD-10-CM | POA: Diagnosis not present

## 2020-12-18 DIAGNOSIS — Z170 Estrogen receptor positive status [ER+]: Secondary | ICD-10-CM | POA: Insufficient documentation

## 2020-12-18 DIAGNOSIS — I10 Essential (primary) hypertension: Secondary | ICD-10-CM | POA: Diagnosis not present

## 2020-12-18 DIAGNOSIS — Z79899 Other long term (current) drug therapy: Secondary | ICD-10-CM | POA: Insufficient documentation

## 2020-12-18 DIAGNOSIS — C50812 Malignant neoplasm of overlapping sites of left female breast: Secondary | ICD-10-CM | POA: Insufficient documentation

## 2020-12-18 DIAGNOSIS — Z7289 Other problems related to lifestyle: Secondary | ICD-10-CM | POA: Diagnosis not present

## 2020-12-18 DIAGNOSIS — Z8249 Family history of ischemic heart disease and other diseases of the circulatory system: Secondary | ICD-10-CM | POA: Diagnosis not present

## 2020-12-18 DIAGNOSIS — C50412 Malignant neoplasm of upper-outer quadrant of left female breast: Secondary | ICD-10-CM

## 2020-12-18 MED ORDER — ANASTROZOLE 1 MG PO TABS: 1.0000 mg | ORAL_TABLET | Freq: Every day | ORAL | 4 refills | Status: DC

## 2020-12-20 DIAGNOSIS — C50412 Malignant neoplasm of upper-outer quadrant of left female breast: Secondary | ICD-10-CM | POA: Diagnosis not present

## 2020-12-25 ENCOUNTER — Other Ambulatory Visit: Payer: Self-pay

## 2020-12-25 ENCOUNTER — Ambulatory Visit (HOSPITAL_COMMUNITY): Payer: Medicare Other | Attending: Cardiology

## 2020-12-25 DIAGNOSIS — I712 Thoracic aortic aneurysm, without rupture, unspecified: Secondary | ICD-10-CM

## 2020-12-25 LAB — ECHOCARDIOGRAM COMPLETE
Area-P 1/2: 2.79 cm2
S' Lateral: 2.8 cm

## 2020-12-25 MED ORDER — PERFLUTREN LIPID MICROSPHERE
1.0000 mL | INTRAVENOUS | Status: AC | PRN
  Administered 2020-12-25: 2 mL via INTRAVENOUS

## 2020-12-29 ENCOUNTER — Ambulatory Visit (INDEPENDENT_AMBULATORY_CARE_PROVIDER_SITE_OTHER): Payer: Medicare Other | Admitting: Podiatry

## 2020-12-29 ENCOUNTER — Other Ambulatory Visit: Payer: Self-pay

## 2020-12-29 DIAGNOSIS — M79676 Pain in unspecified toe(s): Secondary | ICD-10-CM

## 2020-12-29 DIAGNOSIS — B351 Tinea unguium: Secondary | ICD-10-CM

## 2020-12-29 DIAGNOSIS — M659 Synovitis and tenosynovitis, unspecified: Secondary | ICD-10-CM | POA: Diagnosis not present

## 2020-12-29 MED ORDER — BETAMETHASONE SOD PHOS & ACET 6 (3-3) MG/ML IJ SUSP
3.0000 mg | Freq: Once | INTRAMUSCULAR | Status: AC
  Administered 2020-12-29: 3 mg via INTRA_ARTICULAR

## 2020-12-29 NOTE — Progress Notes (Signed)
   SUBJECTIVE 65 year old female presenting today for follow up evaluation of bilateral ankle pain. She states she was doing well and the injections helped resolve her pain until three days ago. She has taken meloxicam in the past and elevating the ankles for treatment. There are no aggravating factors noted at this time.  She also complains of elongated, thickened nails 1-5 noted bilaterally that cause pain while ambulating in shoes. She is unable to trim her own nails. Patient is here for further evaluation and treatment.   Past Medical History:  Diagnosis Date  . Arthritis   . Cancer ([place]) 11/2019   Left breast  . Diverticulitis   . Family history of ovarian cancer   . Heart murmur    11/26/19 echo: Mild AS. AV mean gradient 12.0 mmHg; however, LVOT gradient 8 mmHg with intracvitary gradient-significant AS is not suspected  . Hypertension     OBJECTIVE General Patient is awake, alert, and oriented x 3 and in no acute distress. Derm Skin is dry and supple bilateral. Negative open lesions or macerations. Remaining integument unremarkable. Nails are tender, long, thickened and dystrophic with subungual debris, consistent with onychomycosis, 1-5 bilateral. No signs of infection noted. Vasc  DP and PT pedal pulses palpable bilaterally. Temperature gradient within normal limits.  Neuro Epicritic and protective threshold sensation grossly intact bilaterally.  Musculoskeletal Exam Pain with palpation noted to the anterior, lateral and medial aspects of the bilateral ankle joints. No symptomatic pedal deformities noted bilateral. Muscular strength within normal limits.  ASSESSMENT 1. Onychodystrophic nails 1-5 bilateral with hyperkeratosis of nails.  2. Onychomycosis of nail due to dermatophyte bilateral 3. Ankle synovitis/DJD bilateral   PLAN OF CARE 1. Patient evaluated today.  2. Instructed to maintain good pedal hygiene and foot care.  3. Mechanical debridement of nails 1-5 bilaterally  performed using a nail nipper. Filed with dremel without incident.  4. Injection of 0.5 mLs Celestone Soluspan injected into the bilateral ankle joints.  5. Return to clinic in 3 mos.    Edrick Kins, DPM [facility]  Dr. Edrick Kins, [place]                                        [place], [place] [address]                Office [phone]  Fax [phone]

## 2020-12-30 ENCOUNTER — Encounter: Payer: Medicare Other | Admitting: Gastroenterology

## 2021-01-01 ENCOUNTER — Other Ambulatory Visit (INDEPENDENT_AMBULATORY_CARE_PROVIDER_SITE_OTHER): Payer: Medicare Other

## 2021-01-01 ENCOUNTER — Encounter: Payer: Self-pay | Admitting: *Deleted

## 2021-01-01 ENCOUNTER — Ambulatory Visit (INDEPENDENT_AMBULATORY_CARE_PROVIDER_SITE_OTHER): Payer: Medicare Other | Admitting: Nurse Practitioner

## 2021-01-01 VITALS — BP 134/84 | HR 86 | Ht 60.75 in | Wt 286.0 lb

## 2021-01-01 DIAGNOSIS — Z1211 Encounter for screening for malignant neoplasm of colon: Secondary | ICD-10-CM

## 2021-01-01 DIAGNOSIS — R932 Abnormal findings on diagnostic imaging of liver and biliary tract: Secondary | ICD-10-CM | POA: Diagnosis not present

## 2021-01-01 DIAGNOSIS — D638 Anemia in other chronic diseases classified elsewhere: Secondary | ICD-10-CM | POA: Diagnosis not present

## 2021-01-01 LAB — PROTIME-INR
INR: 1.1 ratio — ABNORMAL HIGH (ref 0.8–1.0)
Prothrombin Time: 12.5 s (ref 9.6–13.1)

## 2021-01-01 MED ORDER — PLENVU 140 G PO SOLR: 1.0000 | ORAL | 0 refills | Status: DC

## 2021-01-01 NOTE — Progress Notes (Signed)
Agree with assessment and plan with the following thoughts:  Will await colonoscopy. Polyp this patient likely has cirrhosis, I recommend a right upper quadrant ultrasound to reassess the liver, evaluate for cirrhosis and screen for [facility] if you can help coordinate.  We should be following her for this issue. Thanks

## 2021-01-01 NOTE — Progress Notes (Signed)
ASSESSMENT AND PLAN    # 65 yo female referred for colon cancer screening.  --Patient will be scheduled for colonoscopy.  The procedure will be done at the hospital given her BMI . The risks and benefits of colonoscopy with possible polypectomy / biopsies were discussed and the patient agrees to proceed.   #Possible history of diverticulitis June 2021.  CT scan suggested possible mild mural thickening in the distal descending colon where multiple diverticula -- Further evaluation at time of colonoscopy  # Chronic [place] anemia without evidence for iron deficiency. CKD likely contributing to anemia.   # Possible Cirrhosis demonstrated on CT scan in July 2021. Fib-4 is 2.27 ( further investigation needed). INR elevated in December. Recent platelet count 163 and albumin 3.4. History of Etoh abuse  ( in remission) and high risk for fatty liver disease -- She has only had 1 drink since December 2020 . Recommend  Etoh avoidance indefinitely.  --Will check INR today.  --Unclear at this point if she does have early cirrhosis. Recommend PCP monitor liver chemistries, CBC,  INR every 6-12 months going forward .   # Hx of left breast cancer pT2 pN1, stage IIB diagnosed in 2021. She is s/p mastectomy, chemotherapy and radiation.. On Anastrozole.  Marland Kitchen  HISTORY OF PRESENT ILLNESS     Chief Complaint : colon cancer screening  [name] is a 65 y.o. female with a past medical history significant for diverticulosis , left breast cancer s/p left mastectomy and chemotherapy / radiation, morbid obesity, kidney stones, HTN, history of Etoh abuse ( in remission), gout, CKD3, chronic anemia,  COVID19, See additional PMH below.   Patient is new to the practice, referred by PCP for colon cancer screening. She needed to be be seen in the office prior to procedure due to her elevated BMI. Patient hasn't had any bowel changes or blood in stool. No [place] of colon cancer.  Patient has never had a colonoscopy  Data  Reviewed:  October 2021  TIBC 133, 97% iron saturation - etoh  December 2021 Ferritin 1092  March 2022 Hgb 9.3, MCV 94, platelets 153 BUN 31, Cr 1.95, albumin 3.4   Past Medical History:  Diagnosis Date  . Alcohol dependence ([place])   . Aortic atherosclerosis ([place])   . Arthritis   . Cancer ([place]) 11/2019   Left breast  . Chronic heart failure ([place])   . Chronic kidney disease, stage 3 ([place])   . COVID-19   . Diverticulitis   . Family history of ovarian cancer   . Heart murmur    11/26/19 echo: Mild AS. AV mean gradient 12.0 mmHg; however, LVOT gradient 8 mmHg with intracvitary gradient-significant AS is not suspected  . Hypertension   . Right nephrolithiasis   . Thoracic aortic aneurysm [facility])      Past Surgical History:  Procedure Laterality Date  . MASTECTOMY WITH AXILLARY LYMPH NODE DISSECTION Left 12/19/2019   Procedure: LEFT MASTECTOMY WITH AXILLARY LYMPH NODE DISSECTION;  Surgeon: [name], MD;  Location: [place];  Service: General;  Laterality: Left;  Marland Kitchen MULTIPLE EXTRACTIONS WITH ALVEOLOPLASTY Bilateral 12/14/2019   Procedure: MULTIPLE EXTRACTION WITH ALVEOLOPLASTY;  Surgeon: [name], DDS;  Location: [place];  Service: Oral Surgery;  Laterality: Bilateral;  . PORTACATH PLACEMENT N/A 12/19/2019   Procedure: INSERTION PORT-A-CATH WITH ULTRASOUND GUIDANCE;  Surgeon: [name], MD;  Location: [place];  Service: General;  Laterality: N/A;  . RADIAL HEAD ARTHROPLASTY Right 06/25/2019   Procedure: RIGHT RADIAL  HEAD ARTHROPLASTY;  Surgeon: [name], MD;  Location: [place];  Service: Orthopedics;  Laterality: Right;  . TUBAL LIGATION     Family History  Problem Relation Age of Onset  . Hypertension Mother   . Dementia Mother   . Ovarian cancer Half-Sister 27  . Diabetes Half-Sister   . Stroke Half-Sister    Social History   Tobacco Use  . Smoking status: Never Smoker  . Smokeless tobacco: Never Used  Vaping Use  . Vaping Use: Never used  Substance  Use Topics  . Alcohol use: Yes    Alcohol/week: 3.0 standard drinks    Types: 1 Cans of beer, 2 Shots of liquor per week    Comment: daily  . Drug use: Yes    Types: Marijuana   Current Outpatient Medications  Medication Sig Dispense Refill  . anastrozole (ARIMIDEX) 1 MG tablet Take 1 tablet (1 mg total) by mouth daily. 90 tablet 4  . ascorbic acid (VITAMIN C) 500 MG tablet Take 1 tablet (500 mg total) by mouth daily. 30 tablet 0  . aspirin EC 81 MG EC tablet Take 1 tablet (81 mg total) by mouth daily. Swallow whole. 30 tablet 11  . Biotin 10000 MCG TABS Take by mouth.    . carvedilol (COREG) 12.5 MG tablet Take 12.5 mg by mouth 2 (two) times daily.    . folic acid (FOLVITE) 1 MG tablet Take 1 tablet (1 mg total) by mouth daily. 30 tablet 0  . furosemide (LASIX) 20 MG tablet Take 1 tablet (20 mg total) by mouth daily. (Patient not taking: Reported on 12/11/2020) 90 tablet 3  . gabapentin (NEURONTIN) 100 MG capsule TAKE 1 CAPSULE BY MOUTH TWICE A DAY 60 capsule 1  . gabapentin (NEURONTIN) 300 MG capsule Take 1 capsule (300 mg total) by mouth at bedtime. (Patient not taking: Reported on 12/11/2020) 90 capsule 6  . lidocaine-prilocaine (EMLA) cream Apply 1 application topically as needed. (Patient not taking: Reported on 12/11/2020) 30 g 0  . Magnesium Oxide 400 MG CAPS Take 1 capsule (400 mg total) by mouth daily. 90 capsule 3  . Multiple Vitamin (MULTIVITAMIN WITH MINERALS) TABS tablet Take 1 tablet by mouth daily.    Marland Kitchen nystatin (MYCOSTATIN/NYSTOP) powder Apply topically 3 (three) times daily. Apply to under panus. (Patient not taking: Reported on 12/11/2020) 60 g 0   No current facility-administered medications for this visit.   Allergies  Allergen Reactions  . Amlodipine Swelling    BLE edema     Review of Systems: Positive for allergy, sinus trouble, arthritis, back pain, fatigue, headaches, swelling of feet and legs.  All other systems reviewed and negative except where noted in HPI.     PHYSICAL EXAM :    Wt Readings from Last 3 Encounters:  01/01/21 286 lb (129.7 kg)  12/11/20 287 lb 12.8 oz (130.5 kg)  11/11/20 293 lb 11.2 oz (133.2 kg)    BP 134/84   Pulse 86   Ht 5' 0.75" (1.543 m)   Wt 286 lb (129.7 kg)   BMI 54.48 kg/m  Constitutional:  Pleasant obese female in no acute distress. In wheelchair.but can ambulate. Psychiatric: Normal mood and affect. Behavior is normal. EENT: Pupils normal.  Conjunctivae are normal. No scleral icterus. Neck supple.  Cardiovascular: Normal rate, regular rhythm. No edema Pulmonary/chest: Effort normal and breath sounds normal. No wheezing, rales or rhonchi. Abdominal: Limited exam in chair. Abdomen soft, nondistended, nontender. Bowel sounds active throughout. There are no masses  palpable. No hepatomegaly. Neurological: Alert and oriented to person place and time. Skin: Skin is warm and dry. No rashes noted.  Tye Savoy, NP  01/01/2021, 9:57 AM  Cc:  Referring Provider Ladell Pier, MD

## 2021-01-01 NOTE — Patient Instructions (Addendum)
Your provider has requested that you go to the basement level for lab work before leaving today. Press "B" on the elevator. The lab is located at the first door on the left as you exit the elevator.  If you are age 66 or older, your body mass index should be between 23-30. Your Body mass index is 54.48 kg/m. If this is out of the aforementioned range listed, please consider follow up with your Primary Care Provider.  If you are age 54 or younger, your body mass index should be between 19-25. Your Body mass index is 54.48 kg/m. If this is out of the aformentioned range listed, please consider follow up with your Primary Care Provider.   Due to recent changes in healthcare laws, you may see the results of your imaging and laboratory studies on MyChart before your provider has had a chance to review them.  We understand that in some cases there may be results that are confusing or concerning to you. Not all laboratory results come back in the same time frame and the provider may be waiting for multiple results in order to interpret others.  Please give [place] 48 hours in order for your provider to thoroughly review all the results before contacting the office for clarification of your results.

## 2021-01-05 ENCOUNTER — Ambulatory Visit: Payer: Medicare Other

## 2021-01-07 ENCOUNTER — Other Ambulatory Visit: Payer: Self-pay

## 2021-01-07 DIAGNOSIS — K7460 Unspecified cirrhosis of liver: Secondary | ICD-10-CM

## 2021-01-14 ENCOUNTER — Ambulatory Visit (HOSPITAL_COMMUNITY): Admission: RE | Admit: 2021-01-14 | Payer: Medicare Other | Source: Ambulatory Visit

## 2021-01-19 ENCOUNTER — Other Ambulatory Visit: Payer: Self-pay | Admitting: Family Medicine

## 2021-01-19 DIAGNOSIS — C50412 Malignant neoplasm of upper-outer quadrant of left female breast: Secondary | ICD-10-CM | POA: Diagnosis not present

## 2021-01-19 NOTE — Telephone Encounter (Signed)
Requested medication (s) are due for refill today: Yes  Requested medication (s) are on the active medication list: Yes  Last refill:  10/16/20  Future visit scheduled: No  Notes to clinic:  Historical provider.    Requested Prescriptions  Pending Prescriptions Disp Refills   carvedilol (COREG) 12.5 MG tablet [Pharmacy Med Name: CARVEDILOL 12.5 MG TABLET] 60 tablet 3    Sig: TAKE 1 TABLET (12.5 MG TOTAL) BY MOUTH 2 (TWO) TIMES DAILY WITH A MEAL.      Cardiovascular:  Beta Blockers Passed - 01/19/2021  1:38 AM      Passed - Last BP in normal range    BP Readings from Last 1 Encounters:  01/01/21 134/84          Passed - Last Heart Rate in normal range    Pulse Readings from Last 1 Encounters:  01/01/21 86          Passed - Valid encounter within last 6 months    Recent Outpatient Visits           1 month ago Encounter for [employer] annual wellness exam   [place] Ladell Pier, MD   3 months ago Essential hypertension   [place], Deborah B, MD   6 months ago Hospital discharge follow-up   [place], MD   8 months ago Venous insufficiency of both lower extremities   [place], Charlane Ferretti, MD   10 months ago Ulcer of lower extremity with fat layer exposed, unspecified laterality [facility])   [place] [facility] Charlott Rakes, MD

## 2021-02-06 ENCOUNTER — Encounter: Payer: Medicare Other | Admitting: Obstetrics and Gynecology

## 2021-02-09 ENCOUNTER — Telehealth: Payer: Self-pay

## 2021-02-09 NOTE — Telephone Encounter (Signed)
New corrected instructions printed and mailed to patient as well as sent via [place].

## 2021-02-09 NOTE — Telephone Encounter (Signed)
Called and spoke to patient regarding colonoscopy scheduled on Tuesday, 6-28 with Dr. Havery Moros at [facility]. Patient is to arrive at 8:00 am.  Informed Patient that she does NOT need to go for Covid testing. Asked that she review her instructions, make sure she has her prep on hand and stop eating highly fibrous foods 5 days prior to the procedure. Asked her to call with questions.  Patient requested that new instructions be sent as she does not have them. She also has not picked up her prep.  She will do so in the next couple of days. Instructions printed and mailed to patient.

## 2021-02-11 ENCOUNTER — Encounter: Payer: Self-pay | Admitting: Family Medicine

## 2021-02-11 ENCOUNTER — Other Ambulatory Visit (HOSPITAL_COMMUNITY)
Admission: RE | Admit: 2021-02-11 | Discharge: 2021-02-11 | Disposition: A | Discharge status: Home / Self Care | Payer: Medicare Other | Source: Ambulatory Visit | Attending: Obstetrics and Gynecology | Admitting: Obstetrics and Gynecology

## 2021-02-11 ENCOUNTER — Ambulatory Visit (INDEPENDENT_AMBULATORY_CARE_PROVIDER_SITE_OTHER): Payer: Medicare Other | Admitting: Family Medicine

## 2021-02-11 ENCOUNTER — Other Ambulatory Visit: Payer: Self-pay

## 2021-02-11 VITALS — BP 166/89 | HR 73 | Ht 61.0 in | Wt 282.1 lb

## 2021-02-11 DIAGNOSIS — Z124 Encounter for screening for malignant neoplasm of cervix: Secondary | ICD-10-CM | POA: Insufficient documentation

## 2021-02-11 DIAGNOSIS — Z01419 Encounter for gynecological examination (general) (routine) without abnormal findings: Secondary | ICD-10-CM | POA: Diagnosis present

## 2021-02-11 DIAGNOSIS — Z1151 Encounter for screening for human papillomavirus (HPV): Secondary | ICD-10-CM | POA: Insufficient documentation

## 2021-02-11 NOTE — Progress Notes (Signed)
GYNECOLOGY OFFICE VISIT NOTE  History:   [name] is a 65 y.o. [id number] here today for pap smear.  Here for pap smear Unable to get on to exam table at PCP's office, referred here Menopause in early 50's No pap semar since 2007, that was normal Reports no history of abnormal pap smears  Health Maintenance Due  Topic Date Due   COLONOSCOPY (Pts 45-38yrs Insurance coverage will need to be confirmed)  Never done   PAP SMEAR-Modifier  01/26/2009   COVID-19 Vaccine (3 - Moderna risk series) 02/02/2020   DEXA SCAN  Never done   Zoster Vaccines- Shingrix (2 of 2) 02/05/2021    Past Medical History:  Diagnosis Date   Alcohol dependence ([place])    Aortic atherosclerosis ([place])    Arthritis    Cancer ([place]) 11/2019   Left breast   Chronic heart failure ([place])    Chronic kidney disease, stage 3 ([place])    COVID-19    Diverticulitis    Family history of ovarian cancer    Heart murmur    11/26/19 echo: Mild AS. AV mean gradient 12.0 mmHg; however, LVOT gradient 8 mmHg with intracvitary gradient-significant AS is not suspected   Hypertension    Right nephrolithiasis    Thoracic aortic aneurysm [facility])     Past Surgical History:  Procedure Laterality Date   MASTECTOMY WITH AXILLARY LYMPH NODE DISSECTION Left 12/19/2019   Procedure: LEFT MASTECTOMY WITH AXILLARY LYMPH NODE DISSECTION;  Surgeon: [name], MD;  Location: [place];  Service: General;  Laterality: Left;   MULTIPLE EXTRACTIONS WITH ALVEOLOPLASTY Bilateral 12/14/2019   Procedure: MULTIPLE EXTRACTION WITH ALVEOLOPLASTY;  Surgeon: [name], DDS;  Location: [place];  Service: Oral Surgery;  Laterality: Bilateral;   PORTACATH PLACEMENT N/A 12/19/2019   Procedure: INSERTION PORT-A-CATH WITH ULTRASOUND GUIDANCE;  Surgeon: [name], MD;  Location: [place];  Service: General;  Laterality: N/A;   RADIAL HEAD ARTHROPLASTY Right 06/25/2019   Procedure: RIGHT RADIAL HEAD ARTHROPLASTY;  Surgeon: [name], MD;  Location:  [place];  Service: Orthopedics;  Laterality: Right;   TUBAL LIGATION      The following portions of the patient's history were reviewed and updated as appropriate: allergies, current medications, past family history, past medical history, past social history, past surgical history and problem list.   Health Maintenance:   Last pap: No results found for: DIAGPAP, HPV, [place] Last pap in system NILM in 2007  Last mammogram:  Has known breast cancer    Review of Systems:  Pertinent items noted in HPI and remainder of comprehensive ROS otherwise negative.  Physical Exam:  BP (!) 166/89 (BP Location: Left Arm)   Pulse 73   Ht 5\' 1"  (1.549 m)   Wt 282 lb 1.6 oz (128 kg)   BMI 53.30 kg/m  CONSTITUTIONAL: Well-developed, well-nourished female in no acute distress.  HEENT:  Normocephalic, atraumatic. External right and left ear normal. No scleral icterus.  NECK: Normal range of motion, supple, no masses noted on observation SKIN: No rash noted. Not diaphoretic. No erythema. No pallor. MUSCULOSKELETAL: Normal range of motion. No edema noted. NEUROLOGIC: Alert and oriented to person, place, and time. Normal muscle tone coordination.  PSYCHIATRIC: Normal mood and affect. Normal behavior. Normal judgment and thought content. RESPIRATORY: Effort normal, no problems with respiration noted PELVIC: Normal appearing external genitalia; normal appearing vaginal mucosa and cervix.  No abnormal discharge noted.    Labs and Imaging No results found for this or any previous  visit (from the past 168 hour(s)). No results found.    Assessment and Plan:   Problem List Items Addressed This Visit   None Visit Diagnoses     Cervical cancer screening    -  Primary   Relevant Orders   Cytology - PAP( [place])      Pap obtained. If negative can discontinue routine screening.  Routine preventative health maintenance measures emphasized. Please refer to After Visit Summary for other counseling  recommendations.   No follow-ups on file.    Total face-to-face time with patient: 15 minutes.  Over 50% of encounter was spent on counseling and coordination of care.   Clarnce Flock, MD/[facility] for [employer], [place]

## 2021-02-12 ENCOUNTER — Telehealth: Payer: Self-pay | Admitting: Gastroenterology

## 2021-02-12 NOTE — Telephone Encounter (Signed)
Inbound call from patient. States pharmacy says the Plenvu is not covered by Medicaid and is $176. Patient wants to know if there is anything else. Best contact [phone]

## 2021-02-12 NOTE — Telephone Encounter (Signed)
Called patient and left message that I will leave a Plenvu sample at the front desk for her to pick up.

## 2021-02-16 LAB — CYTOLOGY - PAP
Comment: NEGATIVE
High risk HPV: NEGATIVE

## 2021-02-19 DIAGNOSIS — C50412 Malignant neoplasm of upper-outer quadrant of left female breast: Secondary | ICD-10-CM | POA: Diagnosis not present

## 2021-02-20 ENCOUNTER — Encounter (HOSPITAL_COMMUNITY): Payer: Self-pay | Admitting: Gastroenterology

## 2021-02-20 ENCOUNTER — Other Ambulatory Visit: Payer: Self-pay

## 2021-02-20 ENCOUNTER — Other Ambulatory Visit (HOSPITAL_COMMUNITY): Payer: Medicare Other

## 2021-02-20 ENCOUNTER — Telehealth: Payer: Self-pay | Admitting: Nurse Practitioner

## 2021-02-20 NOTE — Telephone Encounter (Signed)
Vm was left to schedule [name]

## 2021-02-23 ENCOUNTER — Telehealth: Payer: Self-pay | Admitting: Family Medicine

## 2021-02-23 DIAGNOSIS — R87619 Unspecified abnormal cytological findings in specimens from cervix uteri: Secondary | ICD-10-CM | POA: Insufficient documentation

## 2021-02-23 MED ORDER — MISOPROSTOL 200 MCG PO TABS: 400.0000 ug | ORAL_TABLET | Freq: Once | ORAL | 0 refills | Status: DC

## 2021-02-23 NOTE — Telephone Encounter (Signed)
Called patient to discuss results of pap smear showing atypical glandular cells. Reviewed need for colposcopy and EMB. Reports no recent vaginal bleeding.   Rx sent for misoprostol 400mg  PO to be taken the night before.  Admin staff please call patient back and schedule her for colposcopy/EMB, she is aware you will be calling.

## 2021-02-24 ENCOUNTER — Ambulatory Visit (HOSPITAL_COMMUNITY): Payer: Medicare Other | Admitting: Anesthesiology

## 2021-02-24 ENCOUNTER — Other Ambulatory Visit: Payer: Self-pay

## 2021-02-24 ENCOUNTER — Other Ambulatory Visit: Payer: Self-pay | Admitting: *Deleted

## 2021-02-24 ENCOUNTER — Encounter (HOSPITAL_COMMUNITY)
Admission: RE | Disposition: A | Discharge status: Home / Self Care | Payer: Self-pay | Source: Home / Self Care | Attending: Gastroenterology

## 2021-02-24 ENCOUNTER — Ambulatory Visit (HOSPITAL_COMMUNITY)
Admission: RE | Admit: 2021-02-24 | Discharge: 2021-02-24 | Disposition: A | Discharge status: Home / Self Care | Payer: Medicare Other | Attending: Gastroenterology | Admitting: Gastroenterology

## 2021-02-24 DIAGNOSIS — Z888 Allergy status to other drugs, medicaments and biological substances status: Secondary | ICD-10-CM | POA: Diagnosis not present

## 2021-02-24 DIAGNOSIS — I712 Thoracic aortic aneurysm, without rupture: Secondary | ICD-10-CM | POA: Diagnosis not present

## 2021-02-24 DIAGNOSIS — I509 Heart failure, unspecified: Secondary | ICD-10-CM | POA: Diagnosis not present

## 2021-02-24 DIAGNOSIS — I130 Hypertensive heart and chronic kidney disease with heart failure and stage 1 through stage 4 chronic kidney disease, or unspecified chronic kidney disease: Secondary | ICD-10-CM | POA: Insufficient documentation

## 2021-02-24 DIAGNOSIS — D127 Benign neoplasm of rectosigmoid junction: Secondary | ICD-10-CM | POA: Insufficient documentation

## 2021-02-24 DIAGNOSIS — R932 Abnormal findings on diagnostic imaging of liver and biliary tract: Secondary | ICD-10-CM

## 2021-02-24 DIAGNOSIS — K5730 Diverticulosis of large intestine without perforation or abscess without bleeding: Secondary | ICD-10-CM | POA: Diagnosis not present

## 2021-02-24 DIAGNOSIS — N183 Chronic kidney disease, stage 3 unspecified: Secondary | ICD-10-CM | POA: Insufficient documentation

## 2021-02-24 DIAGNOSIS — D126 Benign neoplasm of colon, unspecified: Secondary | ICD-10-CM

## 2021-02-24 DIAGNOSIS — K648 Other hemorrhoids: Secondary | ICD-10-CM | POA: Insufficient documentation

## 2021-02-24 DIAGNOSIS — Z1211 Encounter for screening for malignant neoplasm of colon: Secondary | ICD-10-CM

## 2021-02-24 DIAGNOSIS — Z6841 Body Mass Index (BMI) 40.0 and over, adult: Secondary | ICD-10-CM | POA: Diagnosis not present

## 2021-02-24 DIAGNOSIS — Z8249 Family history of ischemic heart disease and other diseases of the circulatory system: Secondary | ICD-10-CM | POA: Insufficient documentation

## 2021-02-24 DIAGNOSIS — Z833 Family history of diabetes mellitus: Secondary | ICD-10-CM | POA: Diagnosis not present

## 2021-02-24 DIAGNOSIS — K635 Polyp of colon: Secondary | ICD-10-CM | POA: Diagnosis not present

## 2021-02-24 DIAGNOSIS — Z8616 Personal history of COVID-19: Secondary | ICD-10-CM | POA: Insufficient documentation

## 2021-02-24 DIAGNOSIS — K7460 Unspecified cirrhosis of liver: Secondary | ICD-10-CM

## 2021-02-24 HISTORY — PX: POLYPECTOMY: SHX5525

## 2021-02-24 HISTORY — PX: COLONOSCOPY WITH PROPOFOL: SHX5780

## 2021-02-24 SURGERY — COLONOSCOPY WITH PROPOFOL
Anesthesia: Monitor Anesthesia Care

## 2021-02-24 MED ORDER — ASPIRIN 81 MG PO TBEC: 81.0000 mg | DELAYED_RELEASE_TABLET | Freq: Every day | ORAL | 3 refills | Status: DC

## 2021-02-24 MED ORDER — PROPOFOL 10 MG/ML IV BOLUS
INTRAVENOUS | Status: DC | PRN
  Administered 2021-02-24 (×3): 20 mg via INTRAVENOUS

## 2021-02-24 MED ORDER — SODIUM CHLORIDE 0.9 % IV SOLN: INTRAVENOUS | Status: DC

## 2021-02-24 MED ORDER — PROPOFOL 500 MG/50ML IV EMUL
INTRAVENOUS | Status: DC | PRN
  Administered 2021-02-24: 100 ug/kg/min via INTRAVENOUS

## 2021-02-24 MED ORDER — LACTATED RINGERS IV SOLN: INTRAVENOUS | Status: DC

## 2021-02-24 MED ORDER — GABAPENTIN 100 MG PO CAPS: 100.0000 mg | ORAL_CAPSULE | Freq: Two times a day (BID) | ORAL | 0 refills | Status: DC

## 2021-02-24 MED ORDER — LIDOCAINE 2% (20 MG/ML) 5 ML SYRINGE
INTRAMUSCULAR | Status: DC | PRN
  Administered 2021-02-24: 100 mg via INTRAVENOUS

## 2021-02-24 SURGICAL SUPPLY — 22 items

## 2021-02-24 NOTE — H&P (Signed)
HPI :  65 y/o female at the hospital for colonoscopy, first time exam. BMI > 50 so case done at the hospital for anesthesia support. Other medical history as outlined below including reported history of diverticulitis. No FH of colon cancer. She feels well without interval changes since we have last seen her in May. No bowel trouble  Past Medical History:  Diagnosis Date   Alcohol dependence ([place])    Aortic atherosclerosis ([place])    Arthritis    Cancer ([place]) 11/2019   Left breast   Chronic heart failure ([facility])    Chronic kidney disease, stage 3 ([place])    COVID-19    Diverticulitis    Family history of ovarian cancer    Heart murmur    11/26/19 echo: Mild AS. AV mean gradient 12.0 mmHg; however, LVOT gradient 8 mmHg with intracvitary gradient-significant AS is not suspected   Hypertension    Right nephrolithiasis    Thoracic aortic aneurysm [facility])      Past Surgical History:  Procedure Laterality Date   MASTECTOMY WITH AXILLARY LYMPH NODE DISSECTION Left 12/19/2019   Procedure: LEFT MASTECTOMY WITH AXILLARY LYMPH NODE DISSECTION;  Surgeon: [name], MD;  Location: [place];  Service: General;  Laterality: Left;   MULTIPLE EXTRACTIONS WITH ALVEOLOPLASTY Bilateral 12/14/2019   Procedure: MULTIPLE EXTRACTION WITH ALVEOLOPLASTY;  Surgeon: [name], DDS;  Location: [place];  Service: Oral Surgery;  Laterality: Bilateral;   PORTACATH PLACEMENT N/A 12/19/2019   Procedure: INSERTION PORT-A-CATH WITH ULTRASOUND GUIDANCE;  Surgeon: [name], MD;  Location: [place];  Service: General;  Laterality: N/A;   RADIAL HEAD ARTHROPLASTY Right 06/25/2019   Procedure: RIGHT RADIAL HEAD ARTHROPLASTY;  Surgeon: [name], MD;  Location: [place];  Service: Orthopedics;  Laterality: Right;   TUBAL LIGATION     Family History  Problem Relation Age of Onset   Hypertension Mother    Dementia Mother    Ovarian cancer Half-Sister 51   Diabetes Half-Sister    Stroke Half-Sister    Social  History   Tobacco Use   Smoking status: Never   Smokeless tobacco: Never  Vaping Use   Vaping Use: Never used  Substance Use Topics   Alcohol use: Yes    Alcohol/week: 3.0 standard drinks    Types: 1 Cans of beer, 2 Shots of liquor per week    Comment: daily   Drug use: Yes    Types: Marijuana   Current Facility-Administered Medications  Medication Dose Route Frequency Provider Last Rate Last Admin   lactated ringers infusion   Intravenous Continuous Elise Knobloch, Carlota Raspberry, MD 20 mL/hr at 02/24/21 0841 New Bag at 02/24/21 0841   Allergies  Allergen Reactions   Amlodipine Swelling    BLE edema     Review of Systems: All systems reviewed and negative except where noted in HPI.   Lab Results  Component Value Date   WBC 4.2 11/11/2020   HGB 9.3 (L) 11/11/2020   HCT 28.6 (L) 11/11/2020   MCV 94.4 11/11/2020   PLT 163 11/11/2020    Lab Results  Component Value Date   CREATININE 1.95 (H) 11/11/2020   BUN 31 (H) 11/11/2020   NA 139 11/11/2020   K 3.8 11/11/2020   CL 99 11/11/2020   CO2 31 11/11/2020    Lab Results  Component Value Date   ALT 10 11/11/2020   AST 18 11/11/2020   ALKPHOS 51 11/11/2020   BILITOT 0.4 11/11/2020     Physical Exam:  Temp (!) 96.6 F (35.9 C) (Temporal)   Ht 5\' 1"  (1.549 m)   Wt 127.9 kg   BMI 53.28 kg/m  Constitutional: Pleasant,well-developed, female in no acute distress. Cardiovascular: Normal rate, regular rhythm.  Pulmonary/chest: Effort normal and breath sounds normal.  Abdominal: Soft, protuberant / obese abdomen, nontender.   Neurological: Alert and oriented to person place and time. Psychiatric: Normal mood and affect. Behavior is normal.   ASSESSMENT AND PLAN: 65 y/o female with BMI > 50, at hospital for anesthesia support for screening colonoscopy. She is asymptomatic from bowel perspective, no FH of colon cancer, but a reported history of diverticulitis. I have discussed risks / benefits of anesthesia and colonoscopy,  she wishes to proceed. Further recommendations pending the results.  [place] Cellar, MD [facility]    No ref. provider found

## 2021-02-24 NOTE — Anesthesia Preprocedure Evaluation (Signed)
Anesthesia Evaluation  Patient identified by MRN, date of birth, ID band Patient awake    Reviewed: Allergy & Precautions, NPO status , Patient's Chart, lab work & pertinent test results  Airway Mallampati: III  TM Distance: <3 FB Neck ROM: Full    Dental no notable dental hx.    Pulmonary neg pulmonary ROS,    Pulmonary exam normal breath sounds clear to auscultation       Cardiovascular hypertension, + Valvular Problems/Murmurs AS  Rhythm:Regular Rate:Normal + Systolic murmurs    Neuro/Psych negative neurological ROS  negative psych ROS   GI/Hepatic negative GI ROS, Neg liver ROS,   Endo/Other  Morbid obesity  Renal/GU Renal InsufficiencyRenal disease  negative genitourinary   Musculoskeletal negative musculoskeletal ROS (+)   Abdominal (+) + obese,   Peds negative pediatric ROS (+)  Hematology negative hematology ROS (+)   Anesthesia Other Findings   Reproductive/Obstetrics negative OB ROS                             Anesthesia Physical Anesthesia Plan  ASA: 3  Anesthesia Plan: MAC   Post-op Pain Management:    Induction: Intravenous  PONV Risk Score and Plan: 2 and Propofol infusion  Airway Management Planned: Nasal Cannula  Additional Equipment:   Intra-op Plan:   Post-operative Plan:   Informed Consent: I have reviewed the patients History and Physical, chart, labs and discussed the procedure including the risks, benefits and alternatives for the proposed anesthesia with the patient or authorized representative who has indicated his/her understanding and acceptance.     Dental advisory given  Plan Discussed with: CRNA and Surgeon  Anesthesia Plan Comments:         Anesthesia Quick Evaluation

## 2021-02-24 NOTE — Interval H&P Note (Signed)
History and Physical Interval Note:  02/24/2021 9:19 AM  [name]  has presented today for surgery, with the diagnosis of colon cancer screening, morbid obesity.  The various methods of treatment have been discussed with the patient and family. After consideration of risks, benefits and other options for treatment, the patient has consented to  Procedure(s): COLONOSCOPY WITH PROPOFOL (N/A) as a surgical intervention.  The patient's history has been reviewed, patient examined, no change in status, stable for surgery.  I have reviewed the patient's chart and labs.  Questions were answered to the patient's satisfaction.     [place]

## 2021-02-24 NOTE — Anesthesia Postprocedure Evaluation (Signed)
Anesthesia Post Note  Patient: [name]  Procedure(s) Performed: COLONOSCOPY WITH PROPOFOL POLYPECTOMY     Patient location during evaluation: PACU Anesthesia Type: MAC Level of consciousness: awake and alert Pain management: pain level controlled Vital Signs Assessment: post-procedure vital signs reviewed and stable Respiratory status: spontaneous breathing, nonlabored ventilation, respiratory function stable and patient connected to nasal cannula oxygen Cardiovascular status: stable and blood pressure returned to baseline Postop Assessment: no apparent nausea or vomiting Anesthetic complications: no   No notable events documented.  Last Vitals:  Vitals:   02/24/21 1010 02/24/21 1021  BP: (!) 175/83 (!) 188/95  Pulse: 80 79  Resp: (!) 24 17  Temp:    SpO2: 100% 97%    Last Pain:  Vitals:   02/24/21 1010  TempSrc:   PainSc: 0-No pain                 Sheryle Vice S

## 2021-02-24 NOTE — Discharge Instructions (Signed)

## 2021-02-24 NOTE — Op Note (Signed)
[facility] Patient Name: [name] Procedure Date: 02/24/2021 MRN: [id number] Attending MD: Carlota Raspberry. Havery Moros , MD Date of Birth: March 16, 1956 CSN: [id number] Age: 65 Admit Type: Outpatient Procedure:                Colonoscopy Indications:              Screening for colorectal malignant neoplasm, This                            is the patient's first colonoscopy Providers:                Carlota Raspberry. Havery Moros, MD, Carmie End, RN, Hinton Dyer, Ladona Ridgel, Technician Referring MD:              Medicines:                Monitored Anesthesia Care Complications:            No immediate complications. Estimated blood loss:                            Minimal. Estimated Blood Loss:     Estimated blood loss was minimal. Procedure:                Pre-Anesthesia Assessment:                           - Prior to the procedure, a History and Physical                            was performed, and patient medications and                            allergies were reviewed. The patient's tolerance of                            previous anesthesia was also reviewed. The risks                            and benefits of the procedure and the sedation                            options and risks were discussed with the patient.                            All questions were answered, and informed consent                            was obtained. Prior Anticoagulants: The patient has                            taken no previous anticoagulant or antiplatelet                            agents.  ASA Grade Assessment: III - A patient with                            severe systemic disease. After reviewing the risks                            and benefits, the patient was deemed in                            satisfactory condition to undergo the procedure.                           After obtaining informed consent, the colonoscope                             was passed under direct vision. Throughout the                            procedure, the patient's blood pressure, pulse, and                            oxygen saturations were monitored continuously. The                            CF-HQ190L ([id number]) Olympus colonoscope was                            introduced through the anus and advanced to the the                            cecum, identified by appendiceal orifice and                            ileocecal valve. The colonoscopy was performed                            without difficulty. The patient tolerated the                            procedure well. The quality of the bowel                            preparation was adequate. The ileocecal valve,                            appendiceal orifice, and rectum were photographed. Scope In: 9:30:50 AM Scope Out: 9:52:36 AM Scope Withdrawal Time: 0 hours 18 minutes 55 seconds  Total Procedure Duration: 0 hours 21 minutes 46 seconds  Findings:      The perianal and digital rectal examinations were normal.      Scattered small-mouthed diverticula were found in the entire colon.      Two sessile polyps were found in the proximal rectum / recto-sigmoid       colon. The polyps were 3 to 4 mm in size. These polyps  were removed with       a cold snare. Resection and retrieval were complete.      Internal hemorrhoids were found during retroflexion. The hemorrhoids       were small.      The exam was otherwise without abnormality. Impression:               - Diverticulosis in the entire examined colon.                           - Two 3 to 4 mm polyps at the proximal rectum /                            recto-sigmoid colon, removed with a cold snare.                            Resected and retrieved.                           - Internal hemorrhoids.                           - The examination was otherwise normal. Moderate Sedation:      No moderate sedation, case performed with  MAC Recommendation:           - Patient has a contact number available for                            emergencies. The signs and symptoms of potential                            delayed complications were discussed with the                            patient. Return to normal activities tomorrow.                            Written discharge instructions were provided to the                            patient.                           - Resume previous diet.                           - Continue present medications.                           - Await pathology results. Procedure Code(s):        --- Professional ---                           [phone], Colonoscopy, flexible; with removal of                            tumor(s), polyp(s), or other lesion(s) by snare  technique Diagnosis Code(s):        --- Professional ---                           Z12.11, Encounter for screening for malignant                            neoplasm of colon                           K64.8, Other hemorrhoids                           K63.5, Polyp of colon                           K57.30, Diverticulosis of large intestine without                            perforation or abscess without bleeding CPT copyright 2019 American Medical Association. All rights reserved. The codes documented in this report are preliminary and upon coder review may  be revised to meet current compliance requirements. Remo Lipps P. Havery Moros, MD 02/24/2021 9:58:45 AM This report has been signed electronically. Number of Addenda: 0

## 2021-02-24 NOTE — Transfer of Care (Signed)
Immediate Anesthesia Transfer of Care Note  Patient: [name]  Procedure(s) Performed: COLONOSCOPY WITH PROPOFOL POLYPECTOMY  Patient Location: Endoscopy Unit  Anesthesia Type:MAC  Level of Consciousness: awake  Airway & Oxygen Therapy: Patient Spontanous Breathing and Patient connected to face mask oxygen  Post-op Assessment: Report given to RN and Post -op Vital signs reviewed and stable  Post vital signs: Reviewed and stable  Last Vitals:  Vitals Value Taken Time  BP 164/70 02/24/21 1001  Temp    Pulse 80 02/24/21 1002  Resp 18 02/24/21 1002  SpO2 100 % 02/24/21 1002  Vitals shown include unvalidated device data.  Last Pain:  Vitals:   02/24/21 0821  TempSrc:   PainSc: 0-No pain         Complications: No notable events documented.

## 2021-02-24 NOTE — Progress Notes (Signed)
Per OV note on 01-01-21, patient needs RUQ ultrasound to reassess the liver, evaluate for cirrhosis and screen for [facility] per [facility]. Order placed.    Patient scheduled for Thursday, 7-7 at 9:00am to arrive at 8:45am. NPO after midnight.  Patient is currently under going colonoscopy today at [facility]. Letter mailed to patient.

## 2021-02-25 ENCOUNTER — Encounter: Payer: Self-pay | Admitting: Internal Medicine

## 2021-02-25 ENCOUNTER — Other Ambulatory Visit: Payer: Self-pay | Admitting: Oncology

## 2021-02-25 DIAGNOSIS — Z170 Estrogen receptor positive status [ER+]: Secondary | ICD-10-CM

## 2021-02-25 LAB — SURGICAL PATHOLOGY

## 2021-02-26 ENCOUNTER — Encounter (HOSPITAL_COMMUNITY): Payer: Self-pay | Admitting: Gastroenterology

## 2021-02-26 ENCOUNTER — Telehealth: Payer: Self-pay

## 2021-02-26 NOTE — Telephone Encounter (Signed)
-----   Message from Roetta Sessions, Council sent at 02/24/2021  9:42 AM EDT ----- Regarding: FW: [place] Patient no showed on 5-18   ----- Message ----- From: Yetta Flock, MD Sent: 02/24/2021   9:21 AM EDT To: Roetta Sessions, CMA Subject: [place]                                             Jan this patient is having a colonoscopy today and noted I had recommended an [place] when she saw Nevin Bloodgood and looks like it was ordered but she never had it done. Can you call her later this week and help coordinate the [place]? Thank you!

## 2021-02-26 NOTE — Telephone Encounter (Signed)
Called and spoke to patient. Confirmed RUQ ultrasound next Thursday, 7-7 at 9:00am at [facility]. Patient mailed instructions and given # if she needs to reschedule.

## 2021-02-27 ENCOUNTER — Other Ambulatory Visit: Payer: Self-pay | Admitting: Internal Medicine

## 2021-02-27 ENCOUNTER — Ambulatory Visit
Admission: RE | Admit: 2021-02-27 | Discharge: 2021-02-27 | Disposition: A | Discharge status: Home / Self Care | Payer: Medicare Other | Source: Ambulatory Visit | Attending: Internal Medicine | Admitting: Internal Medicine

## 2021-02-27 ENCOUNTER — Other Ambulatory Visit: Payer: Self-pay

## 2021-02-27 ENCOUNTER — Encounter: Payer: Self-pay | Admitting: Oncology

## 2021-02-27 DIAGNOSIS — Z1231 Encounter for screening mammogram for malignant neoplasm of breast: Secondary | ICD-10-CM

## 2021-02-27 IMAGING — MG DIGITAL SCREENING UNILAT RIGHT W/ CAD
4 series · 4 of 4 positions shown · non-contrast
Comparison: Previous exam(s).

CLINICAL DATA: Screening.

EXAM:
DIGITAL SCREENING UNILATERAL RIGHT MAMMOGRAM WITH CAD
TECHNIQUE: Right screening digital craniocaudal and mediolateral oblique
mammograms were obtained. The images were evaluated with
computer-aided detection.

[R MLO (1 of 2)]
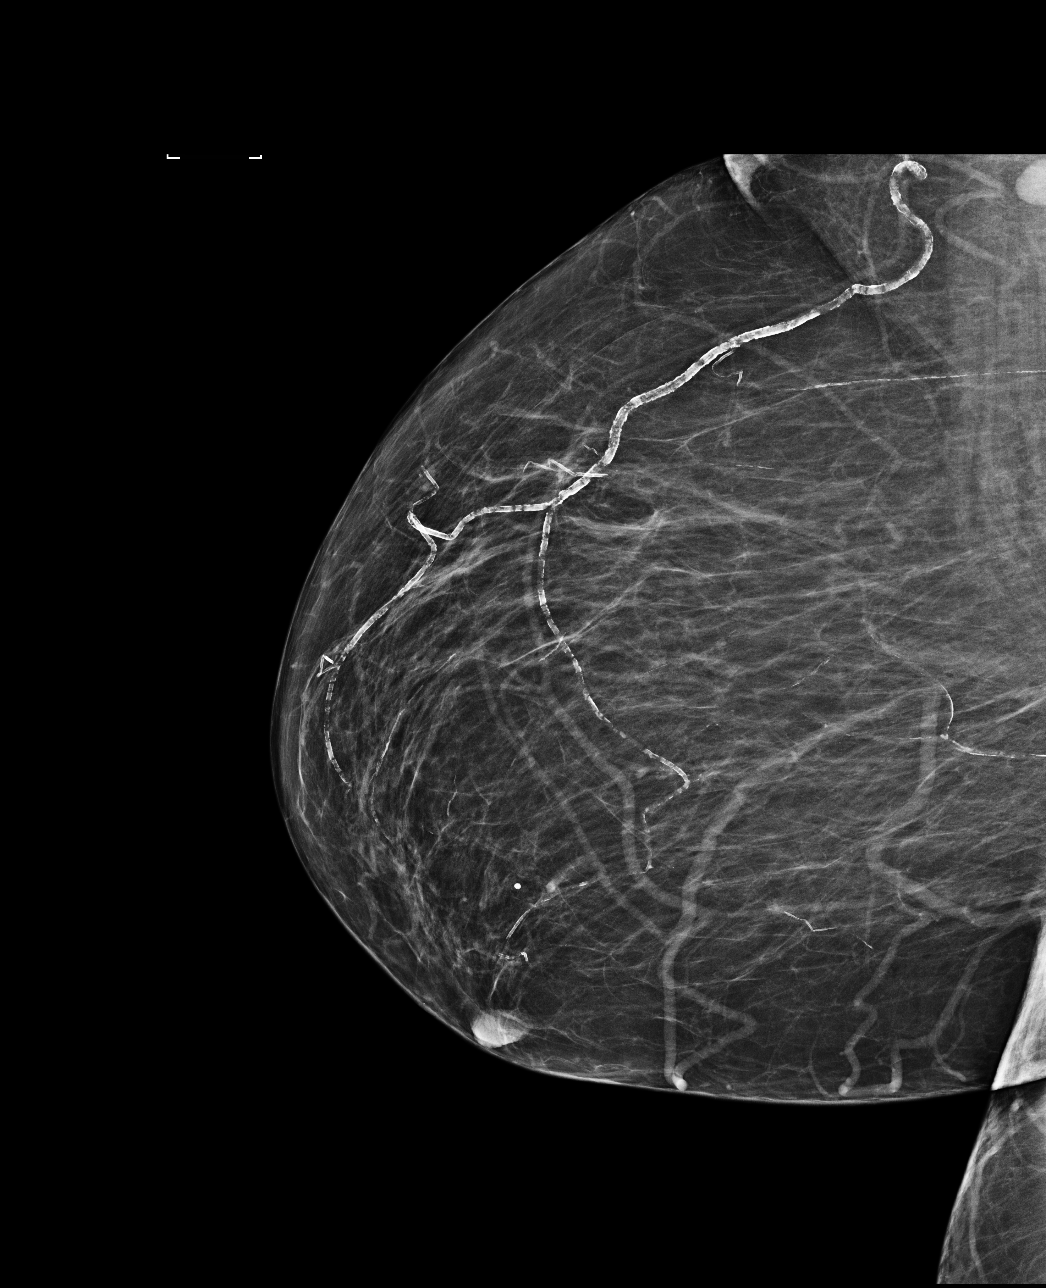

[R MLO (2 of 2)]
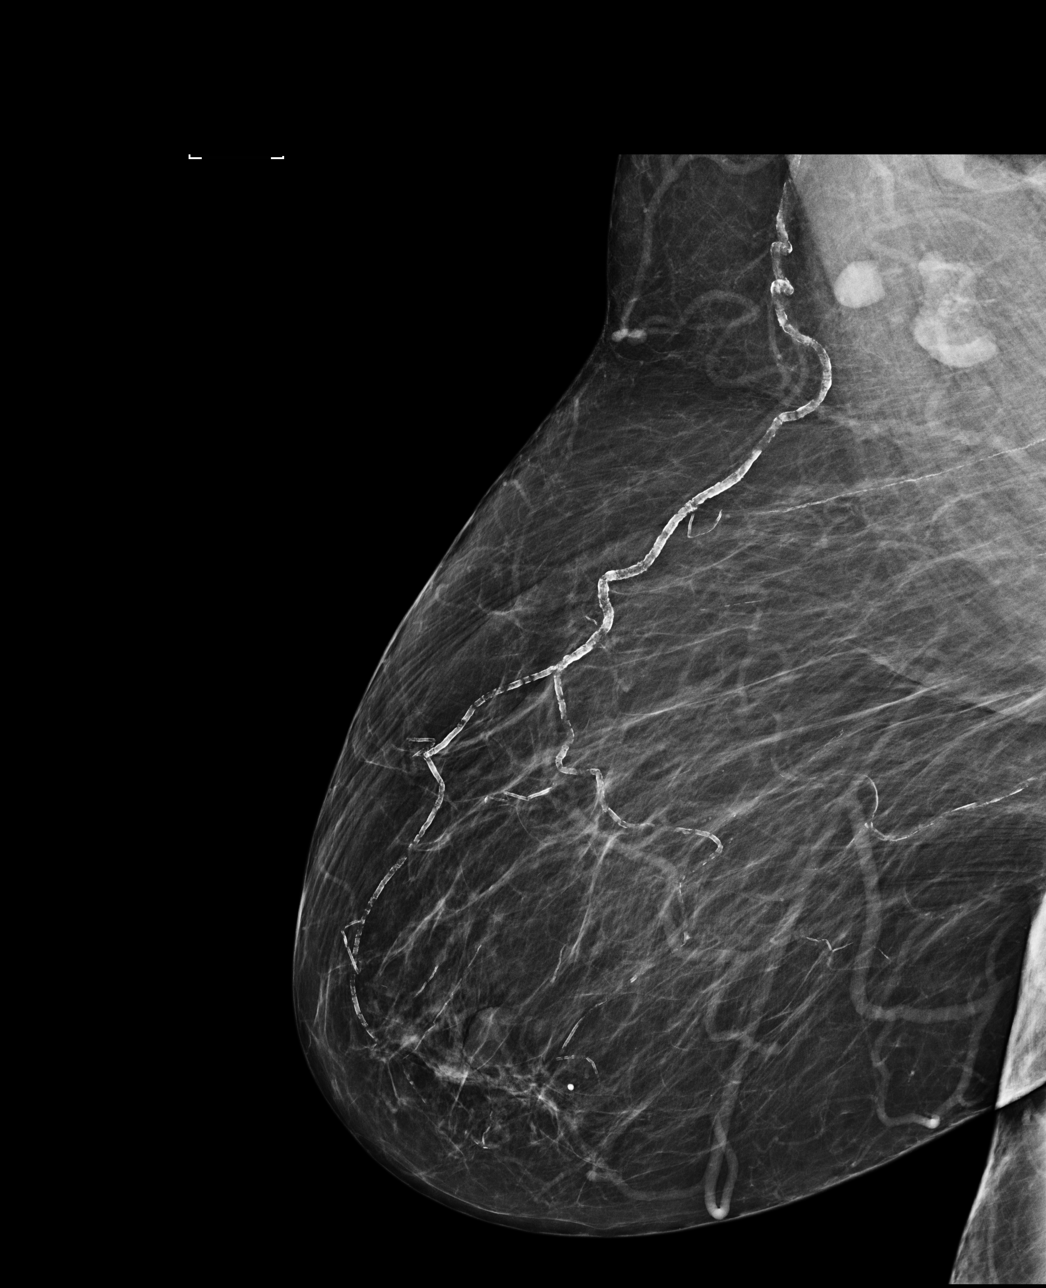

[R CC (1 of 2)]
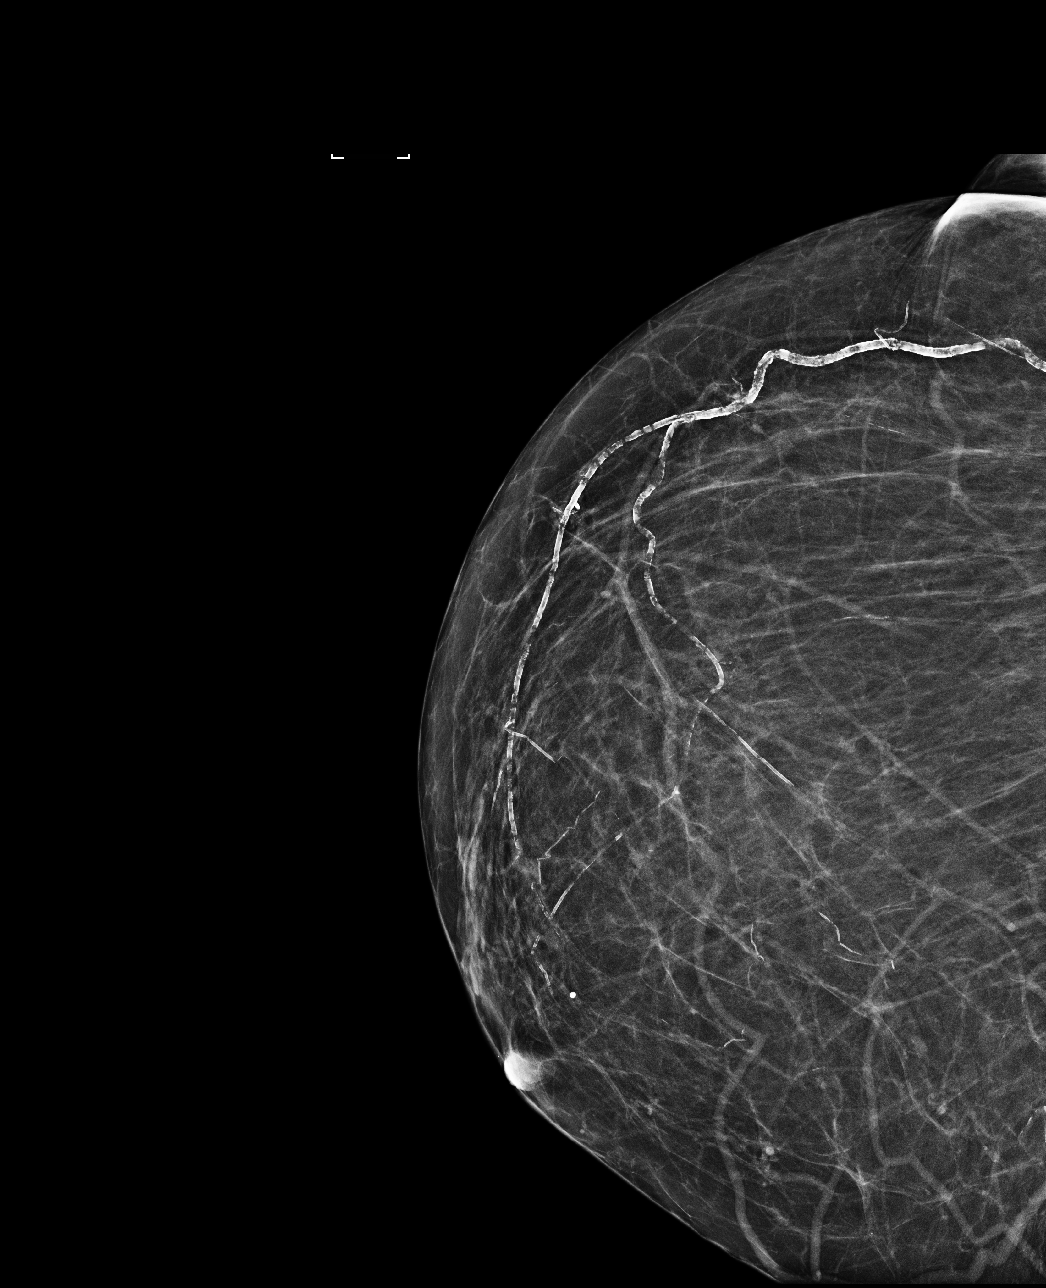

[R CC (2 of 2)]
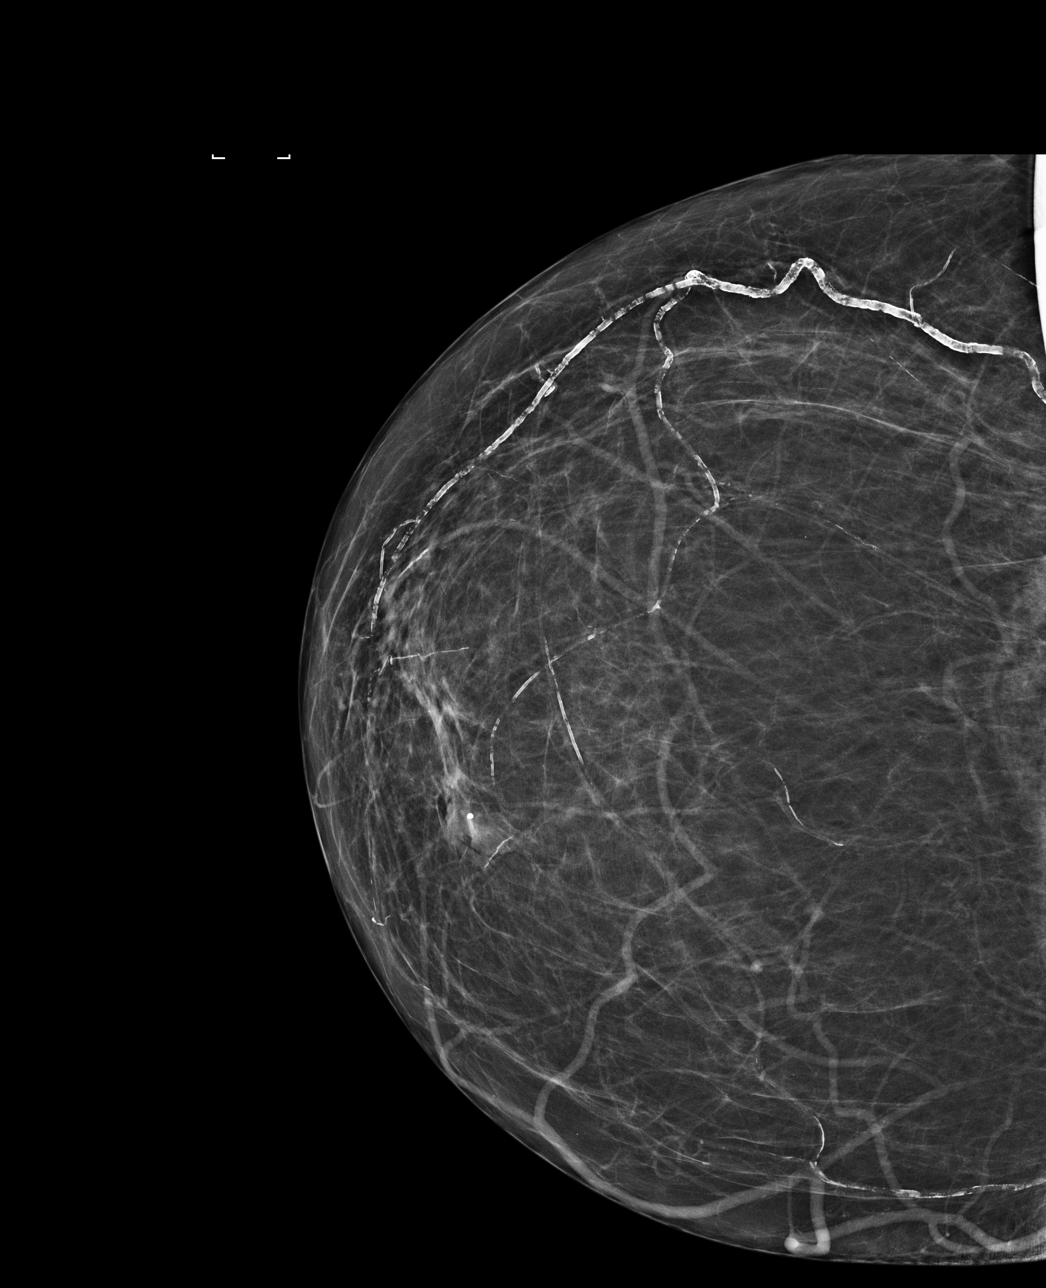

[4 of 4 positions shown; findings below may reference images not displayed]

ACR Breast Density Category b: There are scattered areas of
fibroglandular density.
FINDINGS: The patient has had a left mastectomy. There are no findings
suspicious for malignancy.
IMPRESSION: No mammographic evidence of malignancy. A result letter of this
screening mammogram will be mailed directly to the patient.

RECOMMENDATION:
Screening mammogram in one year.  (Code:[CX])

BI-RADS CATEGORY  1: Negative.

## 2021-03-05 ENCOUNTER — Ambulatory Visit (HOSPITAL_COMMUNITY): Admit: 2021-03-05 | Payer: Medicare Other

## 2021-03-20 ENCOUNTER — Encounter: Payer: Self-pay | Admitting: Oncology

## 2021-03-25 ENCOUNTER — Telehealth: Payer: Self-pay

## 2021-03-25 ENCOUNTER — Other Ambulatory Visit: Payer: Self-pay

## 2021-03-25 ENCOUNTER — Ambulatory Visit (INDEPENDENT_AMBULATORY_CARE_PROVIDER_SITE_OTHER): Payer: Medicare Other | Admitting: Podiatry

## 2021-03-25 DIAGNOSIS — B351 Tinea unguium: Secondary | ICD-10-CM

## 2021-03-25 DIAGNOSIS — M79676 Pain in unspecified toe(s): Secondary | ICD-10-CM

## 2021-03-25 DIAGNOSIS — M659 Synovitis and tenosynovitis, unspecified: Secondary | ICD-10-CM | POA: Diagnosis not present

## 2021-03-25 MED ORDER — BETAMETHASONE SOD PHOS & ACET 6 (3-3) MG/ML IJ SUSP: 3.0000 mg | Freq: Once | INTRAMUSCULAR | Status: DC

## 2021-03-25 NOTE — Progress Notes (Signed)
   SUBJECTIVE 65 year old female presenting today for follow up evaluation of bilateral ankle pain. She states she was doing well and the injections helped resolve her pain until three days ago. She has taken meloxicam in the past and elevating the ankles for treatment. There are no aggravating factors noted at this time.  She also complains of elongated, thickened nails 1-5 noted bilaterally that cause pain while ambulating in shoes. She is unable to trim her own nails. Patient is here for further evaluation and treatment.   Past Medical History:  Diagnosis Date   Alcohol dependence ([place])    Aortic atherosclerosis ([place])    Arthritis    Cancer ([place]) 11/2019   Left breast   Chronic heart failure (HCC)    Chronic kidney disease, stage 3 ([place])    COVID-19    Diverticulitis    Family history of ovarian cancer    Heart murmur    11/26/19 echo: Mild AS. AV mean gradient 12.0 mmHg; however, LVOT gradient 8 mmHg with intracvitary gradient-significant AS is not suspected   Hypertension    Right nephrolithiasis    Thoracic aortic aneurysm (HCC)    Tubular adenoma polyp of rectum     OBJECTIVE General Patient is awake, alert, and oriented x 3 and in no acute distress. Derm Skin is dry and supple bilateral. Negative open lesions or macerations. Remaining integument unremarkable. Nails are tender, long, thickened and dystrophic with subungual debris, consistent with onychomycosis, 1-5 bilateral. No signs of infection noted. Vasc  DP and PT pedal pulses palpable bilaterally. Temperature gradient within normal limits.  Neuro Epicritic and protective threshold sensation grossly intact bilaterally.  Musculoskeletal Exam Pain with palpation noted to the anterior, lateral and medial aspects of the bilateral ankle joints. No symptomatic pedal deformities noted bilateral. Muscular strength within normal limits.  ASSESSMENT 1. Onychodystrophic nails 1-5 bilateral with hyperkeratosis of nails.  2.  Onychomycosis of nail due to dermatophyte bilateral 3. Ankle synovitis/DJD bilateral   PLAN OF CARE 1. Patient evaluated today.  2. Instructed to maintain good pedal hygiene and foot care.  3. Mechanical debridement of nails 1-5 bilaterally performed using a nail nipper. Filed with dremel without incident.  4. Injection of 0.5 mLs Celestone Soluspan injected into the bilateral ankle joints.  5. Return to clinic in 3 mos.    Edrick Kins, DPM [facility]  Dr. Edrick Kins, [address]                                        [place], [place] [address]                Office [phone]  Fax [phone]

## 2021-03-26 ENCOUNTER — Encounter: Payer: Self-pay | Admitting: Oncology

## 2021-04-02 ENCOUNTER — Other Ambulatory Visit: Payer: Self-pay | Admitting: *Deleted

## 2021-04-02 NOTE — Patient Outreach (Addendum)
[facility]) Care Management  04/02/2021  [name] Nov 08, 1955 [id number]  [facility] outreach to EMMI join referred patient   Sue [name] was referred to [facility] on March 25 2021 from the [place] EMMI referral to engage her for [facility] care management services. [name] engagement score of 9 with some swelling in her legs.     Insurance Insurance claims handler of [place]   Initial Assessment [name] confirms she has swelling of her legs  Has not seen pcp Karle Plumber yet Previously seen by a cardiologist but did not feel MD beneficial She is aware of sodium intake causing swelling but initially denied having history of chronic congestive Heart Failure (CHF) as documented in her past medical history (PMH)  Pt educated on congestive Heart Failure (CHF) and the worsening symptoms in the CHF action plan She confirms she has had several worsening symptoms Reviewed her hospital after summary sheet from October 2021 the indicated the hospitalization she was noted to have CHF She admits during 2021 she did not review her after summary forms and "had been through so much I was just ready to go home" She agrees to [facility] services for disease management and care coordination  She agrees to being se Prefers to receive mailed EMMI education for review prior to next agree outreach  She also discussed her need for an ophthalmologist She states she was to seen by Dr Katy Fitch but with some conflicts with appointment scheduling. RN CM offer to assist with rescheduling but she inquires about other in network providers She will be sent a list of other in network ophthalmologists but agrees to outreach to Dr Gershon Crane at  [phone] to inquire about services prior to next outreach   Patient Active Problem List   Diagnosis Date Noted   Benign neoplasm of colon    Atypical glandular cells on cervical Pap smear 44/81/8563   Acute metabolic encephalopathy 14/97/0263   COVID-19 virus  infection 08/01/2020   COVID-19 08/01/2020   Chronic heart failure with preserved ejection fraction ([place]) 06/26/2020   Thoracic aortic aneurysm without rupture ([place]) 06/26/2020   Hyponatremia 05/31/2020   Pancytopenia ([place]) 05/31/2020   Cellulitis of thigh 04/29/2020   Tinea corporis 04/29/2020   Anemia of chronic disease 04/29/2020   Chronic kidney disease, stage 3a ([place]) 04/29/2020   Cellulitis of abdominal wall    Constipation    Folate deficiency    Hypomagnesemia    Debility    Gout 03/03/2020   Skin breakdown 03/03/2020   Skin ulcer of perineum, limited to breakdown of skin ([place]) 03/03/2020   Leukocytosis 03/03/2020   Anemia associated with chemotherapy 03/03/2020   Thrombocytopenia ([place]) 03/03/2020   Port-A-Cath in place 02/19/2020   Acute renal failure (ARF) ([place]) 01/22/2020   S/P left mastectomy 12/19/2019   Colon cancer screening 11/30/2019   Morbid obesity with BMI of 60.0-69.9, adult ([place]) 11/21/2019   Family history of ovarian cancer    Malignant neoplasm of upper-outer quadrant of left breast in female, estrogen receptor positive ([place]) 11/14/2019   Fracture of radial head, right, closed 06/25/2019   Frequent falls 01/10/2019   Bilateral bunions 01/10/2019   Toenail fungus 78/58/8502   Alcoholic hepatitis 77/41/2878   Edema 11/20/2018   Alcohol-induced mood disorder ([place]) 11/01/2013   Alcohol dependence ([place]) 11/01/2013   Essential hypertension 06/20/2007   LOW BACK PAIN 06/20/2007   DIVERTICULOSIS, COLON 05/05/2007    Current Outpatient Medications on File Prior to Visit  Medication Sig Dispense  Refill   anastrozole (ARIMIDEX) 1 MG tablet Take 1 tablet (1 mg total) by mouth daily. 90 tablet 4   ascorbic acid (VITAMIN C) 500 MG tablet Take 1 tablet (500 mg total) by mouth daily. (Patient taking differently: Take 1,000 mg by mouth daily.) 30 tablet 0   aspirin 81 MG EC tablet Take 1 tablet (81 mg total) by mouth daily. Swallow whole. 90 tablet 3   Biotin  10000 MCG TABS Take 10,000 mcg by mouth daily.     carvedilol (COREG) 12.5 MG tablet TAKE 1 TABLET (12.5 MG TOTAL) BY MOUTH 2 (TWO) TIMES DAILY WITH A MEAL. (Patient taking differently: Take 12.5 mg by mouth 2 (two) times daily with a meal.) 60 tablet 3   folic acid (FOLVITE) 1 MG tablet Take 1 tablet (1 mg total) by mouth daily. (Patient not taking: Reported on 02/19/2021) 30 tablet 0   furosemide (LASIX) 20 MG tablet Take 1 tablet (20 mg total) by mouth daily. 90 tablet 3   gabapentin (NEURONTIN) 100 MG capsule Take 1 capsule (100 mg total) by mouth 2 (two) times daily. 180 capsule 0   gabapentin (NEURONTIN) 300 MG capsule Take 1 capsule (300 mg total) by mouth at bedtime. 90 capsule 6   lidocaine-prilocaine (EMLA) cream Apply 1 application topically as needed. 30 g 0   Magnesium Oxide 400 MG CAPS Take 1 capsule (400 mg total) by mouth daily. 90 capsule 3   misoprostol (CYTOTEC) 200 MCG tablet Take 2 tablets (400 mcg total) by mouth once for 1 dose. Take two pills one time the night before your gynecological procedure 2 tablet 0   Multiple Vitamin (MULTIVITAMIN WITH MINERALS) TABS tablet Take 1 tablet by mouth daily.     nystatin (MYCOSTATIN/NYSTOP) powder Apply topically 3 (three) times daily. Apply to under panus. (Patient taking differently: Apply 1 application topically daily as needed (Apply to under panus.).) 60 g 0   Current Facility-Administered Medications on File Prior to Visit  Medication Dose Route Frequency Provider Last Rate Last Admin   betamethasone acetate-betamethasone sodium phosphate (CELESTONE) injection 3 mg  3 mg Intra-articular Once Edrick Kins, DPM        Plan Patient agrees to care plan and follow up within the next 30 business days RN CM number provided   Joelene Millin L. [name], RN, BSN, [place] Coordinator Office number [phone] Main [facility] number [phone] Fax number [phone]

## 2021-04-08 ENCOUNTER — Other Ambulatory Visit: Payer: Self-pay | Admitting: Internal Medicine

## 2021-04-08 NOTE — Telephone Encounter (Signed)
No future visit , last seen 3 months ago

## 2021-04-21 DIAGNOSIS — C50412 Malignant neoplasm of upper-outer quadrant of left female breast: Secondary | ICD-10-CM | POA: Diagnosis not present

## 2021-04-22 ENCOUNTER — Encounter: Payer: Self-pay | Admitting: *Deleted

## 2021-04-22 ENCOUNTER — Other Ambulatory Visit: Payer: Self-pay

## 2021-04-22 ENCOUNTER — Other Ambulatory Visit: Payer: Self-pay | Admitting: *Deleted

## 2021-04-22 NOTE — Patient Outreach (Addendum)
[facility]) Care Management  04/22/2021  Donelle Hise Feb 07, 1956 [id number]   THN follow up outreach to [facility] join referred patient    [name] was referred to [facility] on March 25 2021 from the [place] EMMI referral to engage her for [facility] care management services. [name] engagement score of 9 with some swelling in her legs.       Insurance Insurance claims handler of [place]     Assessment Mrs Veno reports she is overall doing well She reports since the last outreach she has focused on the immediate needs and care of her adult handicap 11 yr old daughter, [name]. [name] has recently returned to the household after being in a group home. [name] has been concentrating on getting her daughter established with mental health and primary care provider (PCP) services. She presently does not have guardianship.  RN CM offered to assist but assistance denied.   Mrs Kemmer continues to need follow up at he pcp and an appointment to an ophthalmologist.  She reports she continues to have lower leg swelling. She reports continuing to take her lasix as ordered and monitoring sodium intake. She and RN CM discussed other options vs Nu salt use like peppers   She denies any worsening symptoms today No pain of legs She was not able to continue assessment related to increase back ground noise. She agrees to a future follow up outreach  Patient Active Problem List   Diagnosis Date Noted   Benign neoplasm of colon    Atypical glandular cells on cervical Pap smear 15/72/6203   Acute metabolic encephalopathy 66/97/4163   COVID-19 virus infection 66/10/2019   COVID-19 08/01/2020   Chronic heart failure with preserved ejection fraction ([place]) 06/26/2020   Thoracic aortic aneurysm without rupture ([place]) 06/26/2020   Hyponatremia 05/31/2020   Pancytopenia ([place]) 66/10/2019   Cellulitis of thigh 04/29/2020   Tinea corporis 04/29/2020   Anemia of chronic disease 04/29/2020    Chronic kidney disease, stage 3a ([place]) 04/29/2020   Cellulitis of abdominal wall    Constipation    Folate deficiency    Hypomagnesemia    Debility    Gout 03/03/2020   Skin breakdown 03/03/2020   Skin ulcer of perineum, limited to breakdown of skin ([place]) 03/03/2020   Leukocytosis 03/03/2020   Anemia associated with chemotherapy 03/03/2020   Thrombocytopenia ([place]) 03/03/2020   Port-A-Cath in place 02/19/2020   Acute renal failure (ARF) ([place]) 66/25/2021   S/P left mastectomy 12/19/2019   Colon cancer screening 11/30/2019   Morbid obesity with BMI of 66.0-69.9, adult ([place]) 11/21/2019   Family history of ovarian cancer    Malignant neoplasm of upper-outer quadrant of left breast in female, estrogen receptor positive ([place]) 11/14/2019   Fracture of radial head, right, closed 06/25/2019   Frequent falls 01/10/2019   Bilateral bunions 01/10/2019   Toenail fungus 66/53/6468   Alcoholic hepatitis 11/18/6646   Edema 11/20/2018   Alcohol-induced mood disorder ([place]) 11/01/2013   Alcohol dependence ([place]) 11/01/2013   Essential hypertension 06/20/2007   LOW BACK PAIN 06/20/2007   DIVERTICULOSIS, COLON 05/05/2007   Plans  Patient agrees to care plan and follow up within the next 30 business days   Goals Addressed               This Visit's Progress     Patient Stated     Track and Manage Fluids and Swelling-Heart Failure (THN) (pt-stated)   On track     Timeframe:  Long-Range Goal Priority:  High Start Date:                            04/22/21 Expected End Date:             07/29/21          Follow Up Date 05/21/21    - use salt in moderation - watch for swelling in feet, ankles and legs every day     Notes:  04/22/21 continues to have lower leg swelling. She reports continuing to take her lasix as ordered and monitoring sodium intake. She and RN CM discussed other options vs Nu salt use like peppers         Jabez Molner L. [name], RN, BSN, [place] Coordinator Office number [phone] Main [facility] number [phone] Fax number [phone]

## 2021-05-14 ENCOUNTER — Encounter: Payer: Self-pay | Admitting: Oncology

## 2021-05-21 ENCOUNTER — Encounter: Payer: Self-pay | Admitting: *Deleted

## 2021-05-21 ENCOUNTER — Other Ambulatory Visit: Payer: Self-pay

## 2021-05-21 ENCOUNTER — Other Ambulatory Visit: Payer: Self-pay | Admitting: *Deleted

## 2021-05-21 NOTE — Patient Outreach (Signed)
[facility]) Care Management  05/21/2021  Cigi Bega [birth year] [id number]   THN follow up outreach to [facility] join referred patient    [name] was referred to [facility] on March 25 2021 from the [place] EMMI referral to engage her for [facility] care management services. [name] engagement score of 9 with some swelling in her legs.       Insurance Insurance claims handler of [place]     Assessment  Sue Gero reports she is doing well today She reports she is now doing better with ambulation  She reports she is no longer having to rely on her walker or cane safely She voices she does need care coordination assistance with finding medical providers   optometrist appointment assistance RN CM reviewed [facility] online list of providers with patient Confirmed the type of eye MD needed She denies diabetes, glaucoma Various outreaches completed  Patient to have eye services completed at her local in network [facility]  She was also assisted with various outreach to help with obtaining her daughter a new primary care provider (Primary care at [facility] [phone],  [phone] [place] [phone])  Spoke with Constance Holster at [phone] to assist with scheduling her daughter, [name] 03/06/1970  with at new patient appointment at [facility] with NP Juluis Mire for August 13 2021 0850 with an arrival at [place]  ([place])  Plans Patient agrees to care plan and follow up within the next 30 business days    Madix Blowe L. [name], RN, BSN, [place] Coordinator Office number [phone] Main [facility] number [phone] Fax number [phone]

## 2021-05-22 DIAGNOSIS — C50412 Malignant neoplasm of upper-outer quadrant of left female breast: Secondary | ICD-10-CM | POA: Diagnosis not present

## 2021-06-02 ENCOUNTER — Other Ambulatory Visit: Payer: Self-pay | Admitting: Internal Medicine

## 2021-06-09 DIAGNOSIS — I712 Thoracic aortic aneurysm, without rupture, unspecified: Secondary | ICD-10-CM | POA: Diagnosis not present

## 2021-06-09 DIAGNOSIS — M109 Gout, unspecified: Secondary | ICD-10-CM | POA: Diagnosis not present

## 2021-06-09 DIAGNOSIS — R2681 Unsteadiness on feet: Secondary | ICD-10-CM | POA: Diagnosis not present

## 2021-06-09 DIAGNOSIS — Z0000 Encounter for general adult medical examination without abnormal findings: Secondary | ICD-10-CM | POA: Diagnosis not present

## 2021-06-09 DIAGNOSIS — I10 Essential (primary) hypertension: Secondary | ICD-10-CM | POA: Diagnosis not present

## 2021-06-09 DIAGNOSIS — I890 Lymphedema, not elsewhere classified: Secondary | ICD-10-CM | POA: Diagnosis not present

## 2021-06-09 DIAGNOSIS — I5032 Chronic diastolic (congestive) heart failure: Secondary | ICD-10-CM | POA: Diagnosis not present

## 2021-06-09 DIAGNOSIS — D638 Anemia in other chronic diseases classified elsewhere: Secondary | ICD-10-CM | POA: Diagnosis not present

## 2021-06-09 DIAGNOSIS — K7460 Unspecified cirrhosis of liver: Secondary | ICD-10-CM | POA: Diagnosis not present

## 2021-06-09 DIAGNOSIS — N1832 Chronic kidney disease, stage 3b: Secondary | ICD-10-CM | POA: Diagnosis not present

## 2021-06-10 ENCOUNTER — Other Ambulatory Visit: Payer: Self-pay

## 2021-06-10 ENCOUNTER — Emergency Department (HOSPITAL_COMMUNITY)
Admission: EM | Admit: 2021-06-10 | Discharge: 2021-06-10 | Disposition: A | Discharge status: Home / Self Care | Payer: Medicare Other | Attending: Emergency Medicine | Admitting: Emergency Medicine

## 2021-06-10 ENCOUNTER — Encounter (HOSPITAL_COMMUNITY): Payer: Self-pay

## 2021-06-10 ENCOUNTER — Emergency Department (HOSPITAL_COMMUNITY): Payer: Medicare Other

## 2021-06-10 DIAGNOSIS — I509 Heart failure, unspecified: Secondary | ICD-10-CM | POA: Diagnosis not present

## 2021-06-10 DIAGNOSIS — Z853 Personal history of malignant neoplasm of breast: Secondary | ICD-10-CM | POA: Diagnosis not present

## 2021-06-10 DIAGNOSIS — S0990XA Unspecified injury of head, initial encounter: Secondary | ICD-10-CM | POA: Insufficient documentation

## 2021-06-10 DIAGNOSIS — Z8616 Personal history of COVID-19: Secondary | ICD-10-CM | POA: Diagnosis not present

## 2021-06-10 DIAGNOSIS — Z85038 Personal history of other malignant neoplasm of large intestine: Secondary | ICD-10-CM | POA: Diagnosis not present

## 2021-06-10 DIAGNOSIS — I130 Hypertensive heart and chronic kidney disease with heart failure and stage 1 through stage 4 chronic kidney disease, or unspecified chronic kidney disease: Secondary | ICD-10-CM | POA: Insufficient documentation

## 2021-06-10 DIAGNOSIS — S3992XA Unspecified injury of lower back, initial encounter: Secondary | ICD-10-CM | POA: Diagnosis not present

## 2021-06-10 DIAGNOSIS — S3993XA Unspecified injury of pelvis, initial encounter: Secondary | ICD-10-CM | POA: Diagnosis not present

## 2021-06-10 DIAGNOSIS — N1831 Chronic kidney disease, stage 3a: Secondary | ICD-10-CM | POA: Diagnosis not present

## 2021-06-10 DIAGNOSIS — M542 Cervicalgia: Secondary | ICD-10-CM | POA: Diagnosis not present

## 2021-06-10 DIAGNOSIS — S8991XA Unspecified injury of right lower leg, initial encounter: Secondary | ICD-10-CM | POA: Diagnosis not present

## 2021-06-10 DIAGNOSIS — M5459 Other low back pain: Secondary | ICD-10-CM | POA: Diagnosis not present

## 2021-06-10 DIAGNOSIS — R600 Localized edema: Secondary | ICD-10-CM | POA: Insufficient documentation

## 2021-06-10 DIAGNOSIS — Z7982 Long term (current) use of aspirin: Secondary | ICD-10-CM | POA: Insufficient documentation

## 2021-06-10 DIAGNOSIS — Y92410 Unspecified street and highway as the place of occurrence of the external cause: Secondary | ICD-10-CM | POA: Insufficient documentation

## 2021-06-10 DIAGNOSIS — N2889 Other specified disorders of kidney and ureter: Secondary | ICD-10-CM | POA: Diagnosis not present

## 2021-06-10 DIAGNOSIS — M79672 Pain in left foot: Secondary | ICD-10-CM | POA: Diagnosis not present

## 2021-06-10 DIAGNOSIS — Z79899 Other long term (current) drug therapy: Secondary | ICD-10-CM | POA: Diagnosis not present

## 2021-06-10 DIAGNOSIS — M79605 Pain in left leg: Secondary | ICD-10-CM | POA: Diagnosis not present

## 2021-06-10 DIAGNOSIS — S79911A Unspecified injury of right hip, initial encounter: Secondary | ICD-10-CM | POA: Diagnosis not present

## 2021-06-10 DIAGNOSIS — R52 Pain, unspecified: Secondary | ICD-10-CM

## 2021-06-10 DIAGNOSIS — M545 Low back pain, unspecified: Secondary | ICD-10-CM | POA: Diagnosis not present

## 2021-06-10 DIAGNOSIS — M79671 Pain in right foot: Secondary | ICD-10-CM | POA: Diagnosis not present

## 2021-06-10 DIAGNOSIS — I10 Essential (primary) hypertension: Secondary | ICD-10-CM | POA: Diagnosis not present

## 2021-06-10 DIAGNOSIS — M79604 Pain in right leg: Secondary | ICD-10-CM | POA: Diagnosis not present

## 2021-06-10 DIAGNOSIS — R519 Headache, unspecified: Secondary | ICD-10-CM | POA: Diagnosis not present

## 2021-06-10 DIAGNOSIS — S79912A Unspecified injury of left hip, initial encounter: Secondary | ICD-10-CM | POA: Diagnosis not present

## 2021-06-10 LAB — URINALYSIS, ROUTINE W REFLEX MICROSCOPIC
Bilirubin Urine: NEGATIVE
Glucose, UA: NEGATIVE mg/dL
Ketones, ur: NEGATIVE mg/dL
Nitrite: POSITIVE — AB
Protein, ur: NEGATIVE mg/dL
Specific Gravity, Urine: 1.012 (ref 1.005–1.030)
WBC, UA: >50 WBC/hpf — ABNORMAL HIGH (ref 0–5)
pH: 5 (ref 5.0–8.0)

## 2021-06-10 IMAGING — CT CT L SPINE W/O CM
3 series · 11 of 33 positions shown, 13 images · non-contrast
Comparison: No prior lumbar spine CT, correlation is made with
[DATE] CT abdomen pelvis.

CLINICAL DATA: Back trauma, fall

EXAM:
CT LUMBAR SPINE WITHOUT CONTRAST
TECHNIQUE: Multidetector CT imaging of the lumbar spine was performed without
intravenous contrast administration. Multiplanar CT image
reconstructions were also generated.

[Series 5: l spine st · axial · 0.41mm/px · z∈[-116,+34]mm · 3 of 123 slices shown, 4 images]
[im 29/123  soft-tissue]
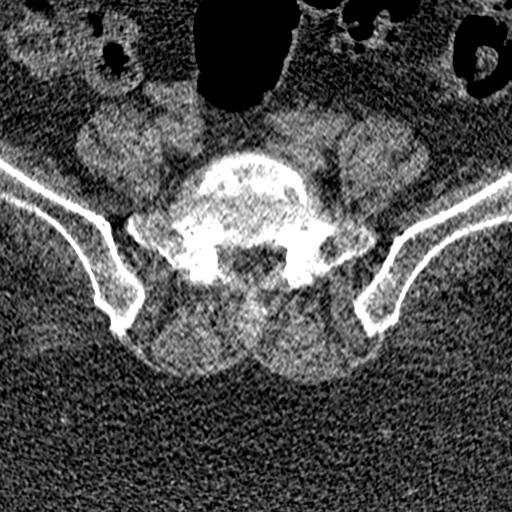
[im 29/123  bone]
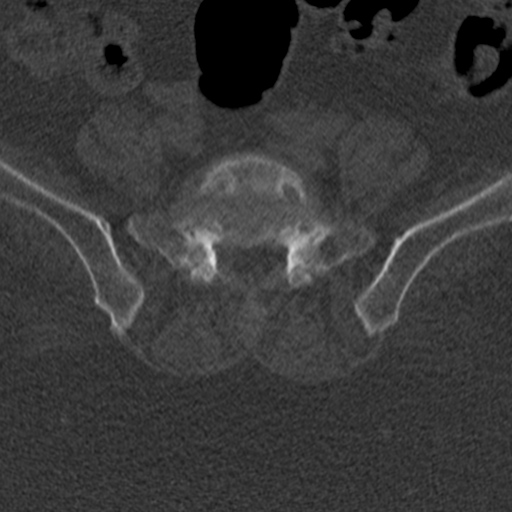
[im 66/123  bone]
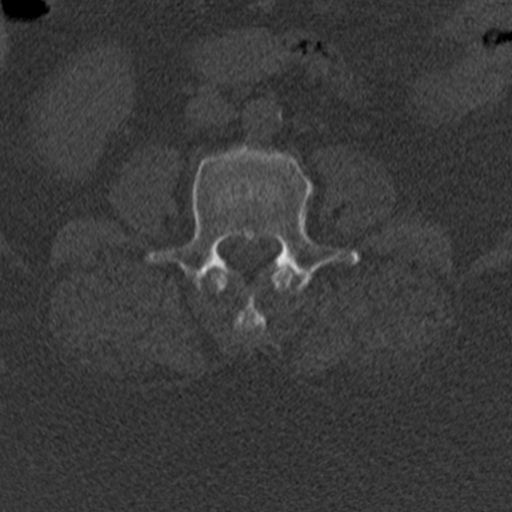
[im 104/123  bone]
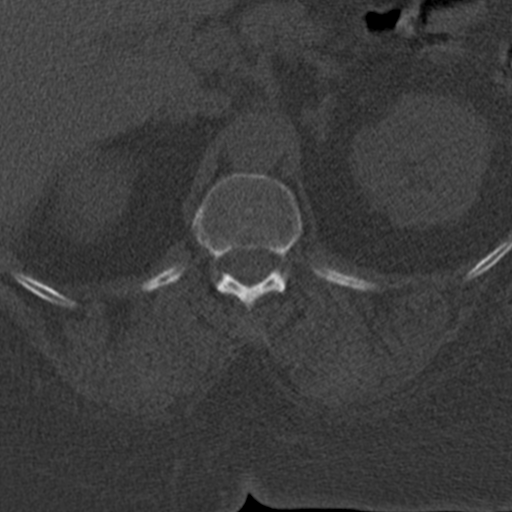

[Series 9: coronal bone · coronal · 0.23mm/px · 3 of 73 slices shown]
[im 15/73  bone]
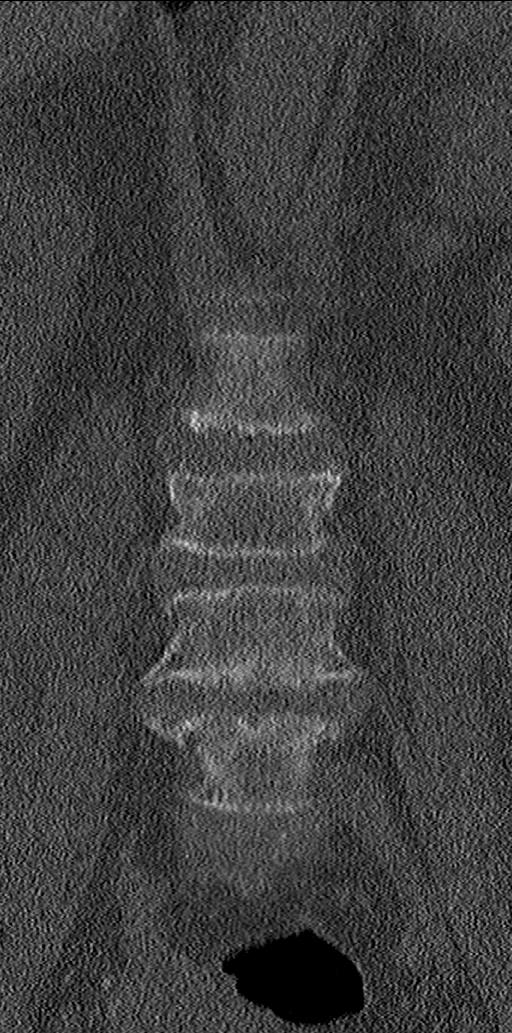
[im 29/73  bone]
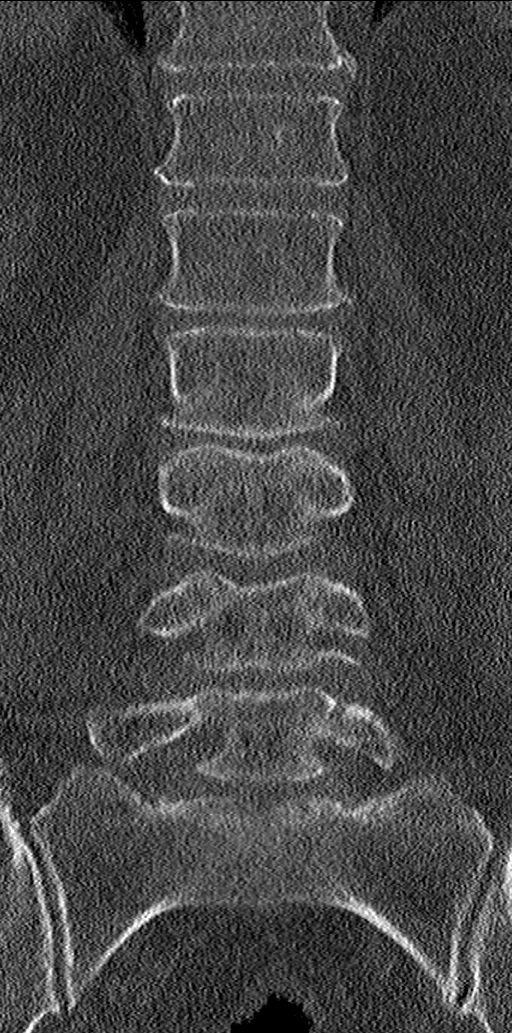
[im 44/73  bone]
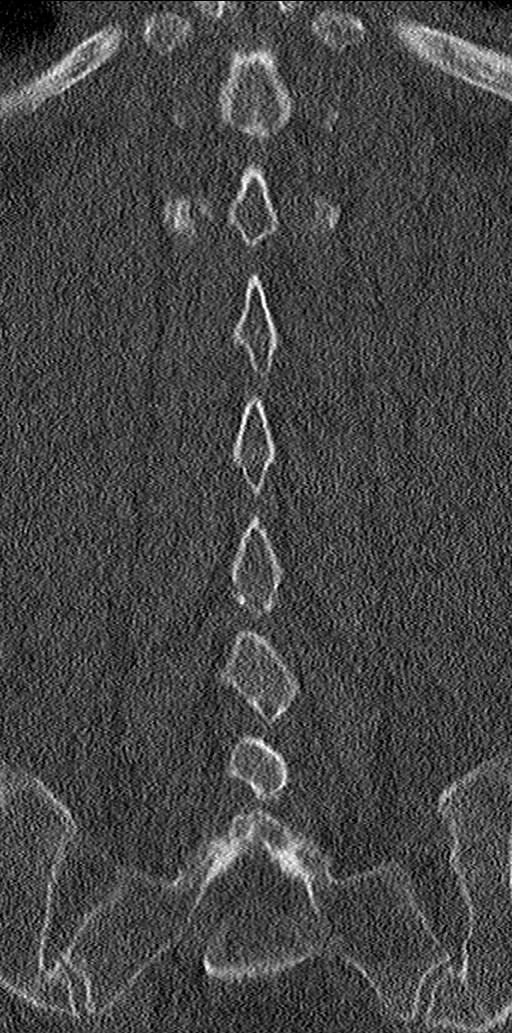

[Series 11: sagittal st · sagittal · 0.28mm/px · 5 of 61 slices shown, 6 images]
[im 21/61  bone]
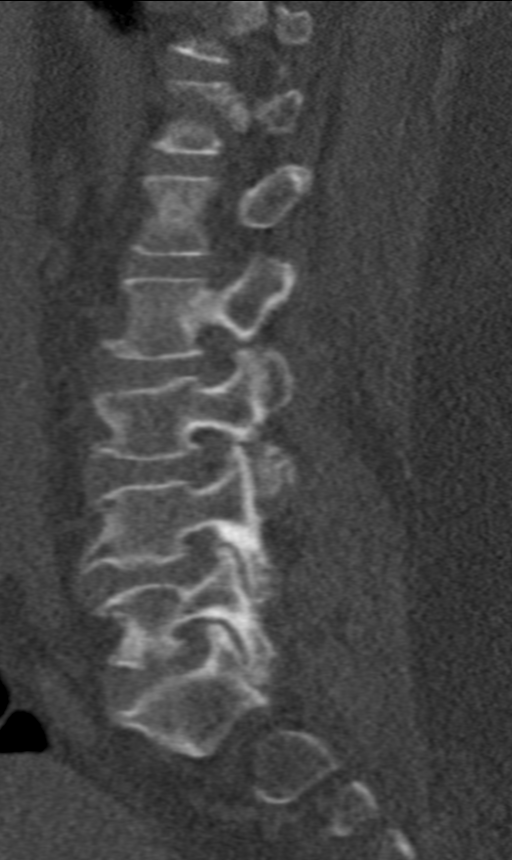
[im 26/61  bone]
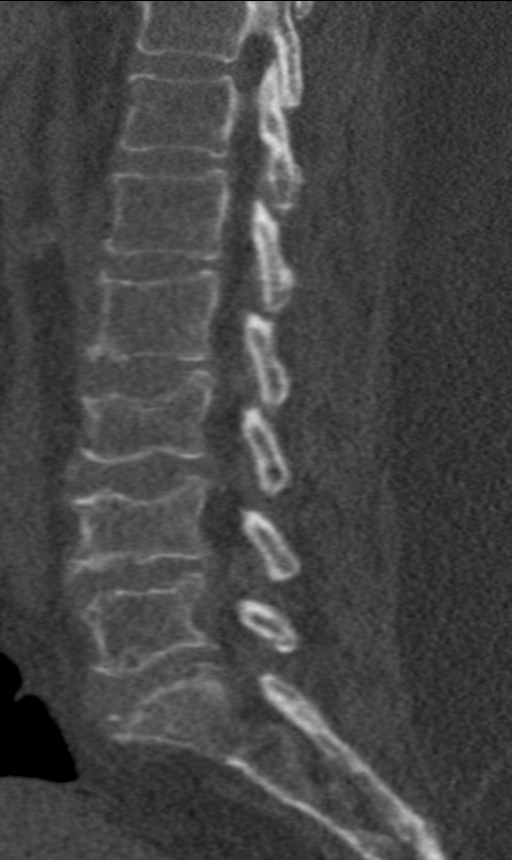
[im 31/61  soft-tissue]
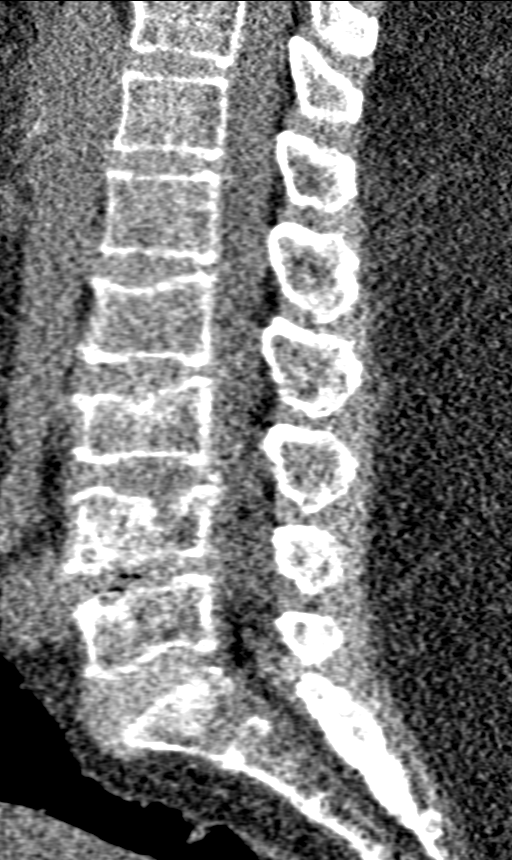
[im 31/61  bone]
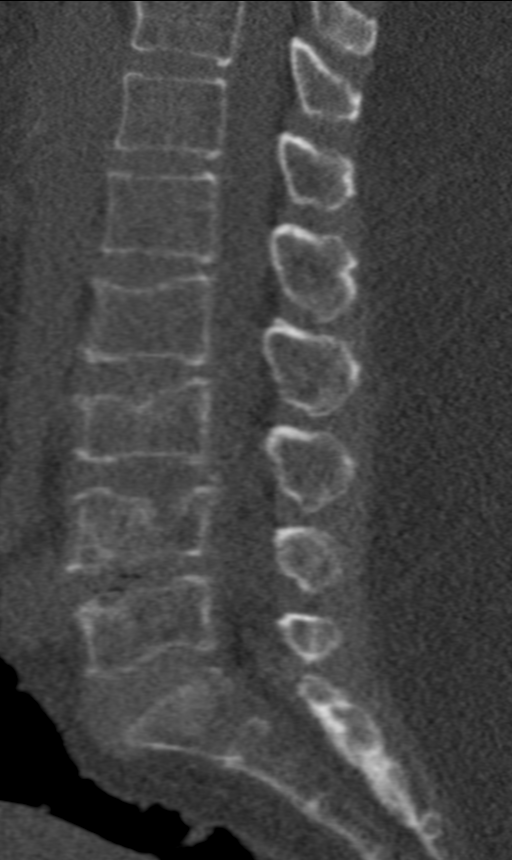
[im 36/61  bone]
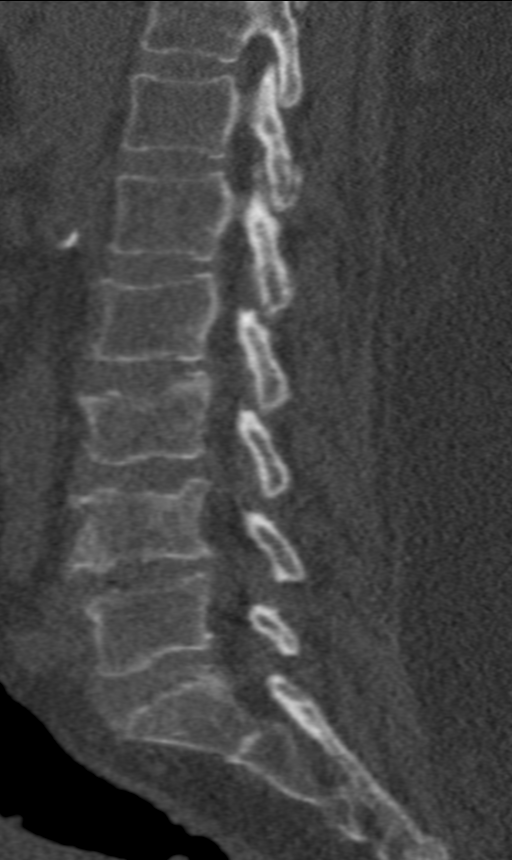
[im 41/61  bone]
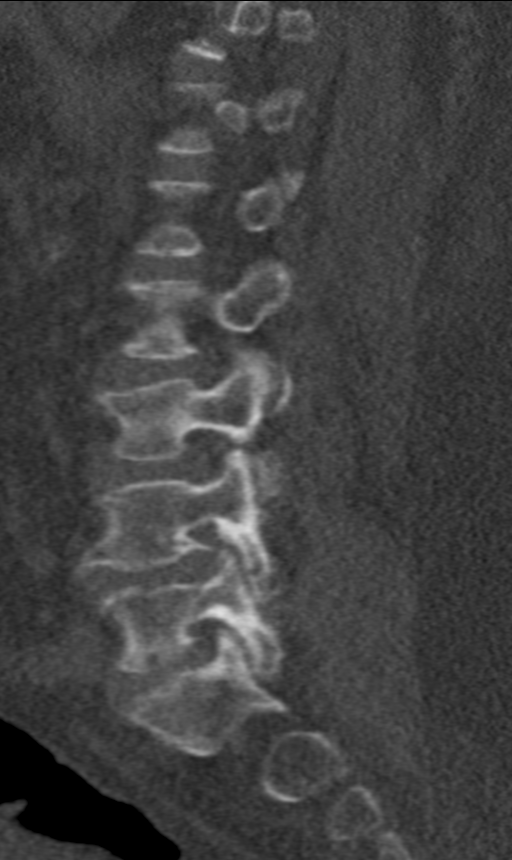

[11 of 33 positions shown; findings below may reference images not displayed]

FINDINGS: Segmentation: 5 lumbar type vertebrae.

Alignment: Straightening of the normal lumbar lordosis. No
listhesis.

Vertebrae: Osteopenia. Chronic compression deformities of L2, L3,
L4, and L5, which appear unchanged compared to [DATE]. No acute
fracture

Paraspinal and other soft tissues: Calcification in the left kidney.
No evidence of hydronephrosis.

Disc levels: No high-grade spinal canal stenosis. Severe left neural
foraminal narrowing at L4.
IMPRESSION: No acute fracture or static listhesis in the lumbar spine.
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
## 2021-06-10 MED ORDER — OXYCODONE-ACETAMINOPHEN 5-325 MG PO TABS
1.0000 | ORAL_TABLET | Freq: Once | ORAL | Status: AC
  Administered 2021-06-10: 1 via ORAL
  Filled 2021-06-10: qty 1

## 2021-06-10 MED ORDER — LIDOCAINE 5 % EX OINT: 1.0000 "application " | TOPICAL_OINTMENT | CUTANEOUS | 0 refills | Status: DC | PRN
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
## 2021-06-10 NOTE — Discharge Instructions (Addendum)
You were seen here today for evaluation of your low back pain after a fall.  Your imaging was benign.  This is most likely musculoskeletal pain.  You been prescribed topical lidocaine to apply to the area as needed.  Please follow-up with her PCP for reevaluation and possible referral to PT/OT.  You may take ibuprofen/Tylenol as needed for pain.  You may apply heat or ice to the area as tolerated.  If you have any increased weakness, incontinence, urinary retention, numbness, tingling, please return to the nearest emergency department.  If any concern, new or worsening symptoms, please return to the nearest emergency department.
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
## 2021-06-10 NOTE — ED Notes (Signed)
Pt called x2 in ED lobby. Currently not present and did not respond to name call.
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
## 2021-06-15 ENCOUNTER — Inpatient Hospital Stay: Payer: Medicare Other | Attending: Oncology | Admitting: Oncology

## 2021-06-15 ENCOUNTER — Inpatient Hospital Stay: Payer: Medicare Other

## 2021-06-15 DIAGNOSIS — Z170 Estrogen receptor positive status [ER+]: Secondary | ICD-10-CM

## 2021-06-15 DIAGNOSIS — C50412 Malignant neoplasm of upper-outer quadrant of left female breast: Secondary | ICD-10-CM

## 2021-06-16 ENCOUNTER — Telehealth: Payer: Self-pay | Admitting: Oncology

## 2021-06-16 NOTE — Telephone Encounter (Signed)
Scheduled appointment per 10/18 los. Left message.

## 2021-06-17 ENCOUNTER — Ambulatory Visit
Admission: RE | Admit: 2021-06-17 | Discharge: 2021-06-17 | Disposition: A | Discharge status: Home / Self Care | Payer: Medicare Other | Source: Ambulatory Visit | Attending: Internal Medicine | Admitting: Internal Medicine

## 2021-06-17 ENCOUNTER — Other Ambulatory Visit: Payer: Self-pay

## 2021-06-17 DIAGNOSIS — Z780 Asymptomatic menopausal state: Secondary | ICD-10-CM

## 2021-06-17 DIAGNOSIS — M85852 Other specified disorders of bone density and structure, left thigh: Secondary | ICD-10-CM | POA: Diagnosis not present

## 2021-06-18 NOTE — Progress Notes (Signed)
Let patient know that her bone density study is consistent with osteopenia.  This means she has some thinning of the bones but not to the extent to be called osteoporosis.  I recommend taking calcium and vitamin D supplement.  She can purchase vitamin D 400 IU and take 1 daily.  Purchase calcium 600 mg and take 1 tablet twice a day.

## 2021-06-19 ENCOUNTER — Other Ambulatory Visit: Payer: Self-pay | Admitting: *Deleted

## 2021-06-19 ENCOUNTER — Telehealth: Payer: Self-pay | Admitting: Family Medicine

## 2021-06-19 ENCOUNTER — Telehealth: Payer: Self-pay

## 2021-06-19 ENCOUNTER — Telehealth: Payer: Self-pay | Admitting: Radiology

## 2021-06-19 ENCOUNTER — Other Ambulatory Visit: Payer: Self-pay
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
## 2021-06-19 NOTE — Telephone Encounter (Signed)
Left voicemail for patient to call [facility] to schedule [place] - per Dr Dione Plover ASAP for AGUS pap in June. Sent mychart as well.
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
## 2021-06-19 NOTE — Telephone Encounter (Signed)
Contacted pt to go over lab results pt didn't answer lvm   Sent a CRM and forward labs to NT to give pt labs when they call back   

## 2021-06-19 NOTE — Telephone Encounter (Signed)
Patient has not made follow up appointment yet for colposcopy for AGUS pap in June. Please call and get her scheduled for appointment asap. FYI to PCP as well.

## 2021-06-21 DIAGNOSIS — C50412 Malignant neoplasm of upper-outer quadrant of left female breast: Secondary | ICD-10-CM | POA: Diagnosis not present

## 2021-06-26 ENCOUNTER — Inpatient Hospital Stay: Payer: Medicare Other | Admitting: Adult Health

## 2021-06-26 ENCOUNTER — Inpatient Hospital Stay: Payer: Medicare Other

## 2021-06-26 ENCOUNTER — Telehealth: Payer: Self-pay | Admitting: Adult Health

## 2021-06-26 NOTE — Telephone Encounter (Signed)
Scheduled appointment per lindsey. Patient aware.

## 2021-06-30 NOTE — Progress Notes (Signed)
[name] did not show for her appt.  No show letter to be mailed.  Wilber Bihari, NP

## 2021-07-01 ENCOUNTER — Inpatient Hospital Stay (HOSPITAL_BASED_OUTPATIENT_CLINIC_OR_DEPARTMENT_OTHER): Payer: Medicare Other | Admitting: Adult Health

## 2021-07-01 ENCOUNTER — Ambulatory Visit: Payer: Medicare Other | Admitting: Podiatry

## 2021-07-01 ENCOUNTER — Inpatient Hospital Stay: Payer: Medicare Other | Attending: Adult Health

## 2021-07-01 DIAGNOSIS — Z170 Estrogen receptor positive status [ER+]: Secondary | ICD-10-CM

## 2021-07-01 DIAGNOSIS — C50412 Malignant neoplasm of upper-outer quadrant of left female breast: Secondary | ICD-10-CM

## 2021-07-07 ENCOUNTER — Ambulatory Visit: Payer: Medicare Other | Admitting: Family Medicine

## 2021-07-09 ENCOUNTER — Ambulatory Visit: Payer: Medicare Other | Admitting: Internal Medicine

## 2021-07-20 ENCOUNTER — Other Ambulatory Visit: Payer: Self-pay | Admitting: *Deleted

## 2021-07-20 NOTE — Patient Outreach (Signed)
[place] [facility]) Care Management  07/20/2021  [name] 1956/04/06 [id number]   [facility]) Care Management Telephonic RN Care Manager Note   07/20/2021 Name:  [name] MRN:  [id number] DOB:  Jul 02, 1956  Summary: [facility] Unsuccessful outreach   Outreach attempt to the listed at the preferred outreach number in [place] [phone] No answer. THN RN CM left HIPAA [facility] Portability and Accountability Act) compliant voicemail message along with CM's contact info.    Subjective: [name] is an 65 y.o. year old female who is a primary patient of Paliwal, Himanshu, MD. The care management team was consulted for assistance with care management and/or care coordination needs.    Objective:  Medications Reviewed Today     Reviewed by Barbaraann Faster, RN (Registered Nurse) on 06/19/21 at [place] List Status: <None>   Medication Order Taking? Sig Documenting Provider Last Dose Status Informant  anastrozole (ARIMIDEX) 1 MG tablet [id number] No Take 1 tablet (1 mg total) by mouth daily. Magrinat, Virgie Dad, MD 02/23/2021 Active Self  ascorbic acid (VITAMIN C) 500 MG tablet [id number] No Take 1 tablet (500 mg total) by mouth daily.  Patient taking differently: Take 1,000 mg by mouth daily.   [name], MD 02/23/2021 Active Self  aspirin 81 MG EC tablet [id number] No Take 1 tablet (81 mg total) by mouth daily. Swallow whole. Magrinat, Virgie Dad, MD Taking Active   betamethasone acetate-betamethasone sodium phosphate (CELESTONE) injection 3 mg [id number]   Edrick Kins, DPM  Active   Biotin 10000 MCG TABS [id number] No Take 10,000 mcg by mouth daily. [provider] 02/23/2021 Active Self  carvedilol (COREG) 12.5 MG tablet [id number]  TAKE 1 TABLET BY MOUTH TWICE DAILY WITH MEALS Ladell Pier, MD  Active   folic acid (FOLVITE) 1 MG tablet [id number] No Take 1 tablet (1 mg total) by mouth daily.  Patient not taking: Reported  on 02/19/2021   [name], MD Not Taking Active Self  furosemide (LASIX) 20 MG tablet [id number]  Take 1 tablet (20 mg total) by mouth daily. Please make overdue appt with Dr. Gasper Sells before anymore refills. Thank you 1st attempt [name], MD  Active   gabapentin (NEURONTIN) 100 MG capsule [id number]  Take 1 capsule (100 mg total) by mouth 2 (two) times daily. Magrinat, Virgie Dad, MD  Active   gabapentin (NEURONTIN) 300 MG capsule [id number] No Take 1 capsule (300 mg total) by mouth at bedtime. Magrinat, Virgie Dad, MD 02/23/2021 Active Self  lidocaine (XYLOCAINE) 5 % ointment [id number]  Apply 1 application topically as needed. Sherrell Puller, PA-C  Active   lidocaine-prilocaine (EMLA) cream [id number] No Apply 1 application topically as needed. Magrinat, Virgie Dad, MD More than a month Active Self           Med Note Bethanne Ginger   Wed Feb 11, 2021  3:57 PM) As needed to access [facility]  Magnesium Oxide 400 MG CAPS [id number] No Take 1 capsule (400 mg total) by mouth daily. [name], MD 02/23/2021 Active Self  misoprostol (CYTOTEC) 200 MCG tablet [id number]  Take 2 tablets (400 mcg total) by mouth once for 1 dose. Take two pills one time the night before your gynecological procedure Clarnce Flock, MD  Expired 02/23/21 2359   Multiple Vitamin (MULTIVITAMIN WITH MINERALS) TABS tablet [id number] No Take 1 tablet by mouth daily. [name], MD 02/23/2021 Active Self  nystatin (MYCOSTATIN/NYSTOP) powder [id number] No  Apply topically 3 (three) times daily. Apply to under panus.  Patient taking differently: Apply 1 application topically daily as needed (Apply to under panus.).   [name], MD 02/23/2021 Active Self  Med List Note [name], CPhT 07/31/20 2348): Granddaughter helped Coreg on outside records but granddaughter said it is not with her meds.. went over all her daily meds             SDOH:  (Social Determinants of Health)  assessments and interventions performed:    Care Plan  Review of patient past medical history, allergies, medications, health status, including review of consultants reports, laboratory and other test data, was performed as part of comprehensive evaluation for care management services.   There are no care plans that you recently modified to display for this patient.    Plan: [facility] RN CM scheduled this patient for another call attempt within 4-7 business days Unsuccessful outreach on 07/20/21  Monea Pesantez L. [name], RN, BSN, [place] Coordinator Office number [phone] Main [facility] number [phone] Fax number [phone]

## 2021-07-29 ENCOUNTER — Other Ambulatory Visit: Payer: Self-pay | Admitting: *Deleted

## 2021-07-29 NOTE — Patient Outreach (Signed)
[place] [facility]) Care Management Telephonic RN Care Manager Note   07/29/2021 Name:  [name] MRN:  [id number] DOB:  [birth year]  Summary: [facility] Unsuccessful outreach     Outreach attempt to the listed at the preferred outreach number in [place] [phone] No answer. THN RN CM left HIPAA [facility] Portability and Accountability Act) compliant voicemail message along with CM's contact info.      Subjective: [name] is an 65 y.o. year old female who is a primary patient of Paliwal, Himanshu, MD. The care management team was consulted for assistance with care management and/or care coordination needs.    Objective:  Medications Reviewed Today     Reviewed by Barbaraann Faster, RN (Registered Nurse) on 06/19/21 at [place] List Status: <None>   Medication Order Taking? Sig Documenting Provider Last Dose Status Informant  anastrozole (ARIMIDEX) 1 MG tablet [id number] No Take 1 tablet (1 mg total) by mouth daily. Magrinat, Virgie Dad, MD 02/23/2021 Active Self  ascorbic acid (VITAMIN C) 500 MG tablet [id number] No Take 1 tablet (500 mg total) by mouth daily.  Patient taking differently: Take 1,000 mg by mouth daily.   [name], MD 02/23/2021 Active Self  aspirin 81 MG EC tablet [id number] No Take 1 tablet (81 mg total) by mouth daily. Swallow whole. Magrinat, Virgie Dad, MD Taking Active   betamethasone acetate-betamethasone sodium phosphate (CELESTONE) injection 3 mg [id number]   Edrick Kins, DPM  Active   Biotin 10000 MCG TABS [id number] No Take 10,000 mcg by mouth daily. [provider] 02/23/2021 Active Self  carvedilol (COREG) 12.5 MG tablet [id number]  TAKE 1 TABLET BY MOUTH TWICE DAILY WITH MEALS Ladell Pier, MD  Active   folic acid (FOLVITE) 1 MG tablet [id number] No Take 1 tablet (1 mg total) by mouth daily.  Patient not taking: Reported on 02/19/2021   [name], MD Not Taking Active Self  furosemide (LASIX) 20 MG tablet [id number]   Take 1 tablet (20 mg total) by mouth daily. Please make overdue appt with Dr. Gasper Sells before anymore refills. Thank you 1st attempt [name], MD  Active   gabapentin (NEURONTIN) 100 MG capsule [id number]  Take 1 capsule (100 mg total) by mouth 2 (two) times daily. Magrinat, Virgie Dad, MD  Active   gabapentin (NEURONTIN) 300 MG capsule [id number] No Take 1 capsule (300 mg total) by mouth at bedtime. Magrinat, Virgie Dad, MD 02/23/2021 Active Self  lidocaine (XYLOCAINE) 5 % ointment [id number]  Apply 1 application topically as needed. Sherrell Puller, PA-C  Active   lidocaine-prilocaine (EMLA) cream [id number] No Apply 1 application topically as needed. Magrinat, Virgie Dad, MD More than a month Active Self           Med Note Bethanne Ginger   Wed Feb 11, 2021  3:57 PM) As needed to access [facility]  Magnesium Oxide 400 MG CAPS [id number] No Take 1 capsule (400 mg total) by mouth daily. [name], MD 02/23/2021 Active Self  misoprostol (CYTOTEC) 200 MCG tablet [id number]  Take 2 tablets (400 mcg total) by mouth once for 1 dose. Take two pills one time the night before your gynecological procedure Clarnce Flock, MD  Expired 02/23/21 2359   Multiple Vitamin (MULTIVITAMIN WITH MINERALS) TABS tablet [id number] No Take 1 tablet by mouth daily. [name], MD 02/23/2021 Active Self  nystatin (MYCOSTATIN/NYSTOP) powder [id number] No Apply topically 3 (three) times daily. Apply to under panus.  Patient taking differently: Apply 1 application topically daily as needed (Apply to under panus.).   [name], MD 02/23/2021 Active Self  Med List Note [name], CPhT 07/31/20 2348): Granddaughter helped Coreg on outside records but granddaughter said it is not with her meds.. went over all her daily meds             SDOH:  (Social Determinants of Health) assessments and interventions performed:    Care Plan  Review of patient past medical history, allergies,  medications, health status, including review of consultants reports, laboratory and other test data, was performed as part of comprehensive evaluation for care management services.   There are no care plans that you recently modified to display for this patient.    Plan: [facility] RN CM scheduled this patient for another call attempt within 4-7 business days Unsuccessful outreach on 07/20/21 Unsuccessful outreach letter sent 07/29/21  Joelene Millin L. [name], RN, BSN, [place] Coordinator Office number [phone] [facility] number [phone] Fax number [phone]

## 2021-07-30 ENCOUNTER — Telehealth: Payer: Self-pay | Admitting: Adult Health

## 2021-07-30 NOTE — Telephone Encounter (Signed)
Scheduled per sch msg. Called, not able to leave msg

## 2021-08-05 ENCOUNTER — Other Ambulatory Visit: Payer: Self-pay | Admitting: *Deleted

## 2021-08-05 NOTE — Patient Outreach (Signed)
[place] [facility]) Care Management Telephonic RN Care Manager Note     07/29/2021 Name:  [name]           MRN:  [id number]      DOB:  02/14/1956   Summary: [facility] Unsuccessful outreach     Outreach attempt to the listed at the preferred outreach number in [place] [phone] No answer. THN RN CM left HIPAA [facility] Portability and Accountability Act) compliant voicemail message along with CM's contact info.via text and sent and sms message as her voice mail box was full     Subjective: [name] is an 65 y.o. year old female who is a primary patient of Paliwal, Himanshu, MD. The care management team was consulted for assistance with care management and/or care coordination needs.    Plan: [facility] RN CM scheduled this patient for another call attempt within 4-7 business days Unsuccessful outreach on 07/20/21, 08/05/21 Unsuccessful outreach letter sent 07/29/21.    Darrelyn Morro L. [name], RN, BSN, [place] Coordinator Office number [phone] Main [facility] number [phone] Fax number [phone]

## 2021-08-06 ENCOUNTER — Ambulatory Visit: Payer: Medicare Other

## 2021-08-10 ENCOUNTER — Other Ambulatory Visit: Payer: Self-pay | Admitting: *Deleted

## 2021-08-10 ENCOUNTER — Other Ambulatory Visit: Payer: Self-pay
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
## 2021-08-11 ENCOUNTER — Inpatient Hospital Stay: Payer: Medicare Other | Admitting: Adult Health

## 2021-08-11 ENCOUNTER — Telehealth: Payer: Self-pay | Admitting: Adult Health

## 2021-08-11 ENCOUNTER — Inpatient Hospital Stay: Payer: Medicare Other

## 2021-08-11 NOTE — Telephone Encounter (Signed)
Scheduled per sch msg. Called and left msg  

## 2021-08-30 ENCOUNTER — Other Ambulatory Visit: Payer: Self-pay

## 2021-08-30 ENCOUNTER — Inpatient Hospital Stay (HOSPITAL_COMMUNITY)
Admission: EM | Admit: 2021-08-30 | Discharge: 2021-09-12 | DRG: 896 | Disposition: A | Discharge status: Skilled Nursing Facility | Payer: Medicare Other | Attending: Internal Medicine | Admitting: Internal Medicine

## 2021-08-30 ENCOUNTER — Encounter: Payer: Self-pay | Admitting: Oncology

## 2021-08-30 ENCOUNTER — Encounter (HOSPITAL_COMMUNITY): Payer: Self-pay | Admitting: Emergency Medicine

## 2021-08-30 DIAGNOSIS — K7030 Alcoholic cirrhosis of liver without ascites: Secondary | ICD-10-CM | POA: Diagnosis present

## 2021-08-30 DIAGNOSIS — Z635 Disruption of family by separation and divorce: Secondary | ICD-10-CM

## 2021-08-30 DIAGNOSIS — Z7982 Long term (current) use of aspirin: Secondary | ICD-10-CM

## 2021-08-30 DIAGNOSIS — I959 Hypotension, unspecified: Secondary | ICD-10-CM | POA: Diagnosis not present

## 2021-08-30 DIAGNOSIS — R0681 Apnea, not elsewhere classified: Secondary | ICD-10-CM

## 2021-08-30 DIAGNOSIS — Z79899 Other long term (current) drug therapy: Secondary | ICD-10-CM

## 2021-08-30 DIAGNOSIS — Z6841 Body Mass Index (BMI) 40.0 and over, adult: Secondary | ICD-10-CM

## 2021-08-30 DIAGNOSIS — I5032 Chronic diastolic (congestive) heart failure: Secondary | ICD-10-CM | POA: Diagnosis present

## 2021-08-30 DIAGNOSIS — I130 Hypertensive heart and chronic kidney disease with heart failure and stage 1 through stage 4 chronic kidney disease, or unspecified chronic kidney disease: Secondary | ICD-10-CM | POA: Diagnosis present

## 2021-08-30 DIAGNOSIS — R5381 Other malaise: Secondary | ICD-10-CM | POA: Diagnosis present

## 2021-08-30 DIAGNOSIS — N179 Acute kidney failure, unspecified: Secondary | ICD-10-CM | POA: Diagnosis present

## 2021-08-30 DIAGNOSIS — E876 Hypokalemia: Secondary | ICD-10-CM | POA: Diagnosis present

## 2021-08-30 DIAGNOSIS — T17910A Gastric contents in respiratory tract, part unspecified causing asphyxiation, initial encounter: Secondary | ICD-10-CM | POA: Diagnosis present

## 2021-08-30 DIAGNOSIS — Z9012 Acquired absence of left breast and nipple: Secondary | ICD-10-CM

## 2021-08-30 DIAGNOSIS — Z818 Family history of other mental and behavioral disorders: Secondary | ICD-10-CM

## 2021-08-30 DIAGNOSIS — Z789 Other specified health status: Secondary | ICD-10-CM

## 2021-08-30 DIAGNOSIS — I700 Atherosclerosis of aorta: Secondary | ICD-10-CM | POA: Diagnosis present

## 2021-08-30 DIAGNOSIS — J9601 Acute respiratory failure with hypoxia: Secondary | ICD-10-CM | POA: Diagnosis present

## 2021-08-30 DIAGNOSIS — I10 Essential (primary) hypertension: Secondary | ICD-10-CM | POA: Diagnosis present

## 2021-08-30 DIAGNOSIS — F10231 Alcohol dependence with withdrawal delirium: Secondary | ICD-10-CM | POA: Diagnosis not present

## 2021-08-30 DIAGNOSIS — Z8616 Personal history of COVID-19: Secondary | ICD-10-CM

## 2021-08-30 DIAGNOSIS — K621 Rectal polyp: Secondary | ICD-10-CM | POA: Diagnosis present

## 2021-08-30 DIAGNOSIS — F1210 Cannabis abuse, uncomplicated: Secondary | ICD-10-CM | POA: Diagnosis present

## 2021-08-30 DIAGNOSIS — Z833 Family history of diabetes mellitus: Secondary | ICD-10-CM

## 2021-08-30 DIAGNOSIS — F10931 Alcohol use, unspecified with withdrawal delirium: Secondary | ICD-10-CM

## 2021-08-30 DIAGNOSIS — N1831 Chronic kidney disease, stage 3a: Secondary | ICD-10-CM | POA: Diagnosis present

## 2021-08-30 DIAGNOSIS — M1990 Unspecified osteoarthritis, unspecified site: Secondary | ICD-10-CM | POA: Diagnosis present

## 2021-08-30 DIAGNOSIS — E440 Moderate protein-calorie malnutrition: Secondary | ICD-10-CM | POA: Diagnosis present

## 2021-08-30 DIAGNOSIS — F10939 Alcohol use, unspecified with withdrawal, unspecified: Secondary | ICD-10-CM

## 2021-08-30 DIAGNOSIS — G8929 Other chronic pain: Secondary | ICD-10-CM | POA: Diagnosis present

## 2021-08-30 DIAGNOSIS — R569 Unspecified convulsions: Secondary | ICD-10-CM

## 2021-08-30 DIAGNOSIS — Z853 Personal history of malignant neoplasm of breast: Secondary | ICD-10-CM

## 2021-08-30 DIAGNOSIS — G9349 Other encephalopathy: Secondary | ICD-10-CM | POA: Diagnosis present

## 2021-08-30 DIAGNOSIS — Z20822 Contact with and (suspected) exposure to covid-19: Secondary | ICD-10-CM | POA: Diagnosis present

## 2021-08-30 DIAGNOSIS — G4089 Other seizures: Secondary | ICD-10-CM | POA: Diagnosis present

## 2021-08-30 DIAGNOSIS — R1110 Vomiting, unspecified: Secondary | ICD-10-CM | POA: Diagnosis present

## 2021-08-30 DIAGNOSIS — Z8041 Family history of malignant neoplasm of ovary: Secondary | ICD-10-CM

## 2021-08-30 DIAGNOSIS — Z8249 Family history of ischemic heart disease and other diseases of the circulatory system: Secondary | ICD-10-CM

## 2021-08-30 DIAGNOSIS — Z781 Physical restraint status: Secondary | ICD-10-CM

## 2021-08-30 DIAGNOSIS — Z823 Family history of stroke: Secondary | ICD-10-CM

## 2021-08-30 DIAGNOSIS — K5730 Diverticulosis of large intestine without perforation or abscess without bleeding: Secondary | ICD-10-CM | POA: Diagnosis present

## 2021-08-30 DIAGNOSIS — N184 Chronic kidney disease, stage 4 (severe): Secondary | ICD-10-CM | POA: Diagnosis present

## 2021-08-30 DIAGNOSIS — D696 Thrombocytopenia, unspecified: Secondary | ICD-10-CM | POA: Diagnosis present

## 2021-08-30 DIAGNOSIS — K7460 Unspecified cirrhosis of liver: Secondary | ICD-10-CM

## 2021-08-30 DIAGNOSIS — Z95828 Presence of other vascular implants and grafts: Secondary | ICD-10-CM

## 2021-08-30 DIAGNOSIS — K7010 Alcoholic hepatitis without ascites: Secondary | ICD-10-CM | POA: Diagnosis present

## 2021-08-30 DIAGNOSIS — E869 Volume depletion, unspecified: Secondary | ICD-10-CM | POA: Diagnosis present

## 2021-08-30 DIAGNOSIS — Z9911 Dependence on respirator [ventilator] status: Secondary | ICD-10-CM

## 2021-08-30 DIAGNOSIS — Z9851 Tubal ligation status: Secondary | ICD-10-CM

## 2021-08-30 DIAGNOSIS — B9620 Unspecified Escherichia coli [E. coli] as the cause of diseases classified elsewhere: Secondary | ICD-10-CM | POA: Diagnosis present

## 2021-08-30 DIAGNOSIS — Z79811 Long term (current) use of aromatase inhibitors: Secondary | ICD-10-CM

## 2021-08-30 DIAGNOSIS — Z978 Presence of other specified devices: Secondary | ICD-10-CM

## 2021-08-30 DIAGNOSIS — N390 Urinary tract infection, site not specified: Secondary | ICD-10-CM | POA: Diagnosis present

## 2021-08-30 DIAGNOSIS — D631 Anemia in chronic kidney disease: Secondary | ICD-10-CM | POA: Diagnosis present

## 2021-08-30 MED ORDER — STERILE WATER FOR INJECTION IJ SOLN
INTRAMUSCULAR | Status: AC
  Administered 2021-08-30: 1.2 mL
  Filled 2021-08-30: qty 10

## 2021-08-30 MED ORDER — ZIPRASIDONE MESYLATE 20 MG IM SOLR
20.0000 mg | Freq: Once | INTRAMUSCULAR | Status: AC
  Administered 2021-08-30: 20 mg via INTRAMUSCULAR
  Filled 2021-08-30: qty 20

## 2021-08-30 MED ORDER — LORAZEPAM 2 MG/ML IJ SOLN
2.0000 mg | Freq: Once | INTRAMUSCULAR | Status: AC | PRN
  Administered 2021-08-31: 2 mg via INTRAVENOUS
  Filled 2021-08-30: qty 1

## 2021-08-30 NOTE — ED Provider Notes (Addendum)
[facility] DEPT Provider Note: Georgena Spurling, MD, FACEP  CSN: [id number] MRN: [id number] ARRIVAL: 08/30/21 at 2243 ROOM: RESB/RESB   CHIEF COMPLAINT  Altered Mental Status  Level 5 caveat: Altered mental status HISTORY OF PRESENT ILLNESS  08/30/21 10:54 PM Mercer Stallworth is a 66 y.o. female with a history of alcoholism, alcoholic hepatitis and breast cancer.  Reportedly she had been on an alcohol binge until 3 days ago.  She was brought in by EMS for altered mental status.  There was questionable seizure activity prior to arrival.  She is thrashing about and moaning and the only coherent thing she is said is "it hurts, it hurts".  She cannot say what hurts.  She does have a history of alcohol withdrawal in the past but her vital signs were noted to be normal on arrival.  She was given 5 mg of IM Versed by EMS prior to arrival with minimal improvement.   Past Medical History:  Diagnosis Date   Alcohol dependence ([place])    Aortic atherosclerosis ([place])    Arthritis    Cancer ([place]) 11/2019   Left breast   Chronic heart failure (H[facility])    Chronic kidney disease, stage 3 ([place])    COVID-19    Diverticulitis    Family history of ovarian cancer    Heart murmur    11/26/19 echo: Mild AS. AV mean gradient 12.0 mmHg; however, LVOT gradient 8 mmHg with intracvitary gradient-significant AS is not suspected   Hypertension    Right nephrolithiasis    Thoracic aortic aneurysm    Tubular adenoma polyp of rectum     Past Surgical History:  Procedure Laterality Date   COLONOSCOPY WITH PROPOFOL N/A 02/24/2021   Procedure: COLONOSCOPY WITH PROPOFOL;  Surgeon: Yetta Flock, MD;  Location: [facility];  Service: Gastroenterology;  Laterality: N/A;   MASTECTOMY WITH AXILLARY LYMPH NODE DISSECTION Left 12/19/2019   Procedure: LEFT MASTECTOMY WITH AXILLARY LYMPH NODE DISSECTION;  Surgeon: [name], MD;  Location: [facility];  Service: General;  Laterality: Left;   MULTIPLE EXTRACTIONS WITH  ALVEOLOPLASTY Bilateral 12/14/2019   Procedure: MULTIPLE EXTRACTION WITH ALVEOLOPLASTY;  Surgeon: [name], DDS;  Location: [facility];  Service: Oral Surgery;  Laterality: Bilateral;   POLYPECTOMY  02/24/2021   Procedure: POLYPECTOMY;  Surgeon: Yetta Flock, MD;  Location: [facility];  Service: Gastroenterology;;   Sol Passer PLACEMENT N/A 12/19/2019   Procedure: INSERTION PORT-A-CATH WITH ULTRASOUND GUIDANCE;  Surgeon: [name], MD;  Location: [facility];  Service: General;  Laterality: N/A;   RADIAL HEAD ARTHROPLASTY Right 06/25/2019   Procedure: RIGHT RADIAL HEAD ARTHROPLASTY;  Surgeon: [name], MD;  Location: [facility];  Service: Orthopedics;  Laterality: Right;   TUBAL LIGATION      Family History  Problem Relation Age of Onset   Hypertension Mother    Dementia Mother    Ovarian cancer Half-Sister 23   Diabetes Half-Sister    Stroke Half-Sister     Social History   Tobacco Use   Smoking status: Never   Smokeless tobacco: Never  Vaping Use   Vaping Use: Never used  Substance Use Topics   Alcohol use: Yes    Alcohol/week: 3.0 standard drinks    Types: 1 Cans of beer, 2 Shots of liquor per week    Comment: daily   Drug use: Yes    Types: Marijuana    Prior to Admission medications   Medication Sig Start Date End Date Taking? Authorizing Provider  anastrozole (ARIMIDEX)  1 MG tablet Take 1 tablet (1 mg total) by mouth daily. 12/18/20   Magrinat, Virgie Dad, MD  ascorbic acid (VITAMIN C) 500 MG tablet Take 1 tablet (500 mg total) by mouth daily. Patient taking differently: Take 1,000 mg by mouth daily. 08/03/20   [name], MD  aspirin 81 MG EC tablet Take 1 tablet (81 mg total) by mouth daily. Swallow whole. 02/24/21   Magrinat, Virgie Dad, MD  Biotin 10000 MCG TABS Take 10,000 mcg by mouth daily.    [provider]  carvedilol (COREG) 12.5 MG tablet TAKE 1 TABLET BY MOUTH TWICE DAILY WITH MEALS 04/08/21   Ladell Pier, MD  folic acid (FOLVITE) 1  MG tablet Take 1 tablet (1 mg total) by mouth daily. Patient not taking: Reported on 02/19/2021 08/03/20   [name], MD  furosemide (LASIX) 20 MG tablet Take 1 tablet (20 mg total) by mouth daily. Please make overdue appt with Dr. Gasper Sells before anymore refills. Thank you 1st attempt 06/03/21   [name], MD  gabapentin (NEURONTIN) 100 MG capsule Take 1 capsule (100 mg total) by mouth 2 (two) times daily. 02/24/21   Magrinat, Virgie Dad, MD  gabapentin (NEURONTIN) 300 MG capsule Take 1 capsule (300 mg total) by mouth at bedtime. 07/14/20   Magrinat, Virgie Dad, MD  lidocaine (XYLOCAINE) 5 % ointment Apply 1 application topically as needed. 06/10/21   Sherrell Puller, PA-C  lidocaine-prilocaine (EMLA) cream Apply 1 application topically as needed. 02/05/20   Magrinat, Virgie Dad, MD  Magnesium Oxide 400 MG CAPS Take 1 capsule (400 mg total) by mouth daily. 06/30/20   Chandrasekhar, Terisa Starr, MD  misoprostol (CYTOTEC) 200 MCG tablet Take 2 tablets (400 mcg total) by mouth once for 1 dose. Take two pills one time the night before your gynecological procedure 02/23/21 02/23/21  Clarnce Flock, MD  Multiple Vitamin (MULTIVITAMIN WITH MINERALS) TABS tablet Take 1 tablet by mouth daily. 06/13/20   Hongalgi, Lenis Dickinson, MD  nystatin (MYCOSTATIN/NYSTOP) powder Apply topically 3 (three) times daily. Apply to under panus. Patient taking differently: Apply 1 application topically daily as needed (Apply to under panus.). 06/12/20   Hongalgi, Lenis Dickinson, MD  traMADol (ULTRAM) 50 MG tablet Take 50 mg by mouth every 8 (eight) hours as needed. 07/27/21   [provider]    Allergies Amlodipine   REVIEW OF SYSTEMS  Negative except as noted here or in the History of Present Illness.   PHYSICAL EXAMINATION  Initial Vital Signs Blood pressure 130/81, pulse 95, temperature 98.5 F (36.9 C), temperature source Oral, resp. rate 18, SpO2 100 %.  Examination General: Well-developed, high BMI  female in no acute distress; appearance consistent with age of record HENT: normocephalic; atraumatic Eyes: pupils equal, round and reactive to light; extraocular muscles grossly intact Neck: supple Heart: regular rate and rhythm Lungs: clear to auscultation bilaterally Chest: Port-A-Cath right upper chest; status post left mastectomy Abdomen: soft; nondistended; nontender; bowel sounds present Extremities: No deformity; full range of motion; pulses normal Neurologic: Awake, alert; motor function intact in all extremities and symmetric; no facial droop Skin: Warm and dry Psychiatric: Agitated; writhing; moaning; no coherent speech   RESULTS  Summary of this visit's results, reviewed and interpreted by myself:   EKG Interpretation  Date/Time:  Monday August 31 2021 00:46:01 EST Ventricular Rate:  87 PR Interval:  182 QRS Duration: 90 QT Interval:  383 QTC Calculation: 461 R Axis:   16 Text Interpretation: Sinus rhythm  Anteroseptal infarct, old No significant change was found Confirmed by Shanon Rosser [phone]) on 08/31/2021 12:48:16 AM       Laboratory Studies: Results for orders placed or performed during the hospital encounter of 08/30/21 (from the past 24 hour(s))  Rapid urine drug screen (hospital performed)     Status: Abnormal   Collection Time: 08/30/21 11:01 PM  Result Value Ref Range   Opiates NONE DETECTED NONE DETECTED   Cocaine NONE DETECTED NONE DETECTED   Benzodiazepines POSITIVE (A) NONE DETECTED   Amphetamines NONE DETECTED NONE DETECTED   Tetrahydrocannabinol POSITIVE (A) NONE DETECTED   Barbiturates NONE DETECTED NONE DETECTED  Comprehensive metabolic panel     Status: Abnormal   Collection Time: 08/31/21 12:01 AM  Result Value Ref Range   Sodium 138 135 - 145 mmol/L   Potassium 3.1 (L) 3.5 - 5.1 mmol/L   Chloride 101 98 - 111 mmol/L   CO2 21 (L) 22 - 32 mmol/L   Glucose, Bld 156 (H) 70 - 99 mg/dL   BUN 16 8 - 23 mg/dL   Creatinine, Ser 1.51 (H) 0.44 -  1.00 mg/dL   Calcium 8.8 (L) 8.9 - 10.3 mg/dL   Total Protein 8.2 (H) 6.5 - 8.1 g/dL   Albumin 3.8 3.5 - 5.0 g/dL   AST 59 (H) 15 - 41 U/L   ALT 35 0 - 44 U/L   Alkaline Phosphatase 60 38 - 126 U/L   Total Bilirubin 1.6 (H) 0.3 - 1.2 mg/dL   GFR, Estimated 38 (L) >60 mL/min   Anion gap 16 (H) 5 - 15  CBC with Differential/Platelet     Status: Abnormal   Collection Time: 08/31/21 12:01 AM  Result Value Ref Range   WBC 9.9 4.0 - 10.5 K/uL   RBC 3.68 (L) 3.87 - 5.11 MIL/uL   Hemoglobin 11.9 (L) 12.0 - 15.0 g/dL   HCT 35.7 (L) 36.0 - 46.0 %   MCV 97.0 80.0 - 100.0 fL   MCH 32.3 26.0 - 34.0 pg   MCHC 33.3 30.0 - 36.0 g/dL   RDW 12.9 11.5 - 15.5 %   Platelets 132 (L) 150 - 400 K/uL   nRBC 0.0 0.0 - 0.2 %   Neutrophils Relative % 81 %   Neutro Abs 8.0 (H) 1.7 - 7.7 K/uL   Lymphocytes Relative 9 %   Lymphs Abs 0.8 0.7 - 4.0 K/uL   Monocytes Relative 4 %   Monocytes Absolute 0.4 0.1 - 1.0 K/uL   Eosinophils Relative 1 %   Eosinophils Absolute 0.1 0.0 - 0.5 K/uL   Basophils Relative 0 %   Basophils Absolute 0.0 0.0 - 0.1 K/uL   Immature Granulocytes 5 %   Abs Immature Granulocytes 0.44 (H) 0.00 - 0.07 K/uL  Ethanol     Status: None   Collection Time: 08/31/21 12:01 AM  Result Value Ref Range   Alcohol, Ethyl (B) <10 <10 mg/dL  Ammonia     Status: Abnormal   Collection Time: 08/31/21 12:01 AM  Result Value Ref Range   Ammonia 57 (H) 9 - 35 umol/L  Resp Panel by RT-PCR (Flu A&B, Covid) Nasopharyngeal Swab     Status: None   Collection Time: 08/31/21 12:01 AM   Specimen: Nasopharyngeal Swab; Nasopharyngeal(NP) swabs in vial transport medium  Result Value Ref Range   SARS Coronavirus 2 by RT PCR NEGATIVE NEGATIVE   Influenza A by PCR NEGATIVE NEGATIVE   Influenza B by PCR NEGATIVE NEGATIVE  Urinalysis, Routine w  reflex microscopic Urine, Catheterized     Status: Abnormal   Collection Time: 08/31/21 12:44 AM  Result Value Ref Range   Color, Urine YELLOW YELLOW   APPearance HAZY  (A) CLEAR   Specific Gravity, Urine 1.009 1.005 - 1.030   pH 6.0 5.0 - 8.0   Glucose, UA NEGATIVE NEGATIVE mg/dL   Hgb urine dipstick SMALL (A) NEGATIVE   Bilirubin Urine NEGATIVE NEGATIVE   Ketones, ur NEGATIVE NEGATIVE mg/dL   Protein, ur 100 (A) NEGATIVE mg/dL   Nitrite POSITIVE (A) NEGATIVE   Leukocytes,Ua MODERATE (A) NEGATIVE   RBC / HPF 0-5 0 - 5 RBC/hpf   WBC, UA >50 (H) 0 - 5 WBC/hpf   Bacteria, UA MANY (A) NONE SEEN   Squamous Epithelial / LPF 0-5 0 - 5   WBC Clumps PRESENT    Mucus PRESENT   Blood gas, arterial (at [facility])     Status: Abnormal   Collection Time: 08/31/21  1:48 AM  Result Value Ref Range   pH, Arterial 7.366 7.350 - 7.450   pCO2 arterial 47.3 32.0 - 48.0 mmHg   pO2, Arterial 307 (H) 83.0 - 108.0 mmHg   Bicarbonate 26.5 20.0 - 28.0 mmol/L   Acid-Base Excess 1.2 0.0 - 2.0 mmol/L   O2 Saturation 99.9 %   Patient temperature 98.6    Imaging Studies: CT Head Wo Contrast  Result Date: 08/31/2021 CLINICAL DATA:  Seizure-like activity. EXAM: CT HEAD WITHOUT CONTRAST TECHNIQUE: Contiguous axial images were obtained from the base of the skull through the vertex without intravenous contrast. COMPARISON:  June 10, 2021 FINDINGS: Brain: No evidence of acute infarction, hemorrhage, hydrocephalus, extra-axial collection or mass lesion/mass effect. Vascular: No hyperdense vessel or unexpected calcification. Skull: Normal. Negative for fracture or focal lesion. Sinuses/Orbits: There is a 2.4 cm x 1.5 cm left maxillary sinus polyp versus mucous retention cyst. Other: None. IMPRESSION: 1. No acute intracranial pathology. 2. Left maxillary sinus polyp versus mucous retention cyst. Electronically Signed   By: Virgina Norfolk M.D.   On: 08/31/2021 00:41   DG Chest Port 1 View  Result Date: 08/31/2021 CLINICAL DATA:  Check intubation and enteric tube positioning. EXAM: PORTABLE CHEST 1 VIEW COMPARISON:  Portable chest 08/01/2019. FINDINGS: Enteric tube is well inside the  stomach but neither the proximal side-hole or tube terminus are included in the exam. ETT tip is 1.5 cm over the carina. Old right subclavian port catheter tip remains at the cavoatrial junction. There is moderate cardiomegaly. Mild perihilar vascular congestion is noted without evidence of overt edema. There is a low inspiration with perihilar atelectatic bands but no convincing focal pneumonia, with limited view of the lower zones. No significant pleural effusion is seen. The aerated lungs are otherwise clear. There are left axillary surgical clips. IMPRESSION: 1. ETT tip is 1.5 cm from the carina. NGT is well inside the stomach but the proximal side hole and tip are not included. 2. Cardiomegaly with mild perihilar vascular congestion without overt edema. 3. Limited evaluation of the lungs due to low lung volumes. No convincing focal pneumonia. Follow-up study recommended in full inspiration. Electronically Signed   By: Telford Nab M.D.   On: 08/31/2021 05:12    ED COURSE and MDM  Nursing notes, initial and subsequent vitals signs, including pulse oximetry, reviewed and interpreted by myself.  Vitals:   08/31/21 0400 08/31/21 0453 08/31/21 0521 08/31/21 0600  BP:  (!) 161/103    Pulse:  (!) 102  87  Resp:  20  13  Temp:    99.8 F (37.7 C)  TempSrc:      SpO2:  100%  100%  Weight: (!) 138 kg     Height:   '5\' 1"'$  (1.549 m)    Medications  potassium chloride 10 mEq in 100 mL IVPB (has no administration in time range)  fentaNYL (SUBLIMAZE) injection 25 mcg (25 mcg Intravenous Given 08/31/21 0508)  fentaNYL (SUBLIMAZE) injection 25-100 mcg (100 mcg Intravenous Given 08/31/21 0450)  vecuronium (NORCURON) 10 MG injection (  Not Given 08/31/21 0445)  sterile water (preservative free) injection (  Not Given 08/31/21 0445)  midazolam (VERSED) 50 mg/50 mL (1 mg/mL) premix infusion (0.5 mg/hr Intravenous New Bag/Given 08/31/21 0533)  hydrALAZINE (APRESOLINE) injection 10 mg (has no administration in time  range)  docusate (COLACE) 50 MG/5ML liquid 100 mg (has no administration in time range)  polyethylene glycol (MIRALAX / GLYCOLAX) packet 17 g (has no administration in time range)  pantoprazole (PROTONIX) injection 40 mg (has no administration in time range)  albuterol (PROVENTIL) (2.5 MG/3ML) 0.083% nebulizer solution 2.5 mg (has no administration in time range)  propofol (DIPRIVAN) 1000 MG/100ML infusion (18.116 mcg/kg/min  138 kg Intravenous Restarted 08/31/21 0521)  fentaNYL 2544mcg in NS 224mL (3mcg/ml) infusion-PREMIX (25 mcg/hr Intravenous New Bag/Given 08/31/21 0535)  fentaNYL (SUBLIMAZE) bolus via infusion 25-100 mcg (has no administration in time range)  cefTRIAXone (ROCEPHIN) 1 g in sodium chloride 0.9 % 100 mL IVPB (has no administration in time range)  heparin injection 5,000 Units (has no administration in time range)  midazolam (VERSED) injection 2 mg (has no administration in time range)  midazolam (VERSED) injection 4 mg (has no administration in time range)  chlorhexidine gluconate (MEDLINE KIT) (PERIDEX) 0.12 % solution 15 mL (has no administration in time range)  MEDLINE mouth rinse (has no administration in time range)  Chlorhexidine Gluconate Cloth 2 % PADS 6 each (has no administration in time range)  ziprasidone (GEODON) injection 20 mg (20 mg Intramuscular Given 08/30/21 2310)  LORazepam (ATIVAN) injection 2 mg (2 mg Intravenous Given 08/31/21 0126)  sterile water (preservative free) injection (1.2 mLs  Given 08/30/21 2310)  diazepam (VALIUM) 5 MG/ML injection (10 mg  Given 08/31/21 0030)  potassium chloride 10 mEq in 100 mL IVPB (0 mEq Intravenous Stopped 08/31/21 0319)  0.9 %  sodium chloride infusion ( Intravenous New Bag/Given 08/31/21 0059)  LORazepam (ATIVAN) injection 2 mg (2 mg Intravenous Given 08/31/21 0303)  fentaNYL (SUBLIMAZE) injection 25 mcg (25 mcg Intravenous Given 08/31/21 0538)   12:18 AM Patient more calm after IM Geodon.  Patient in CT scanner to evaluate for  possible brain metastases as cause of altered mental status given the patient has a history of breast cancer.   12:40 AM On return from CT the patient had a brief (several seconds) seizure after which she went apneic.  Her oxygen saturation dropped into the 30s but this was brought up back with bag-valve-mask ventilation.  She is now on a nonrebreather with oxygen saturation of 100%.  She was given 10 mg of Valium IV to help prevent further seizures.  Her presentation on arrival could have been a postictal state from the questionable seizure prior to arrival.  EMS reports the family stated she has gone through "DTs" in the past with similar presentations.  My evaluation of the CT brain reveals no suspicious metastases or other lesions.  Radiologist interpretation pending.  12:52 AM Patient mildly hypokalemic, will start potassium repletion.  Creatinine is elevated but patient has had elevated creatinine to a higher degree in the past.   1:50 AM Arterial specimen obtained as described below.  Waiting for results of arterial blood gas to help determine appropriate level of care for admission.  Pupils pinpoint.   2:01 AM ABG within normal limits.  Dr. Cyd Silence to admit to progressive care.  3:07 AM Patient had another brief (several seconds) seizure after which she had transient apnea requiring bag-valve-mask ventilation.  She is now breathing on her own.  Her oxygen saturation is 100% on a nonrebreather.  Patient given an additional 2 mg of Ativan.    3:47 AM The cause of her pinpoint pupils is unclear as she has not received opioids.  Her pupils were not pinpoint on arrival.  Dr. Cyd Silence has consulted neurology who does not recommend Keppra other anticonvulsant medications at this time.  He recommends continued scheduled or as needed doses of benzodiazepines.  Our general consensus is that the patient is having alcohol withdrawal seizures with transient postictal apnea and prolonged postictal  delirium.  4:31 AM After further discussion with Dr. Cyd Silence and Dr. Carson Myrtle (PCCM) we are concerned the patient's airway could be a problem.  She has had 2 seizure events in the ED followed by apnea requiring bag-valve-mask ventilation.  Given multiple doses of sedative medications could make further episodes more problematic.  We will intubate the patient and Dr. Carson Myrtle will admit to the ICU.  4:50 AM Patient successfully intubated as described below.  Post intubation chest radiograph reviewed.  Endotracheal tube appears to be about 2 cm proximal to the carina.  OG tube placed by nursing staff, appears to be within the stomach.  Patient placed on propofol drip for sedation pending admission orders.  5:47 AM Urine drug screen positive for THC (and benzodiazepines, but likely those given therapeutically), no evidence of other common drugs of abuse.  Urinalysis consistent with urinary tract infection.  Rocephin ordered by admitting physician.  Urine sent for culture.  PROCEDURES  ARTERIAL BLOOD GAS  Date/Time: 08/31/2021 1:49 AM Performed by: Kimberlin Scheel, MD Authorized by: Fanta Wimberley, MD   Consent:    Consent obtained:  Emergent situation Universal protocol:    Patient identity confirmed:  Arm band Anesthesia:    Anesthesia method:  None (Site prepped with chlorhexidine) Procedure details:    Location:  L femoral (Artery localized with bedside ultrasound)   Allen's test performed: no     Number of attempts:  2 Post-procedure details:    Dressing applied: yes     Bleeding control:  Manual pressure applied   Circulation, movement, and sensation:  Normal   Procedure completion:  Tolerated well, no immediate complications Procedure Name: Intubation Date/Time: 08/31/2021 4:33 AM Performed by: Ousmane Seeman, MD Pre-anesthesia Checklist: Patient identified, Emergency Drugs available, Suction available, Patient being monitored and Timeout performed Oxygen Delivery Method: Ambu  bag Ventilation: Nasal airway inserted- appropriate to patient size and Two handed mask ventilation required Laryngoscope Size: 5 Grade View: Grade III Tube type: Non-subglottic suction tube Tube size: 7.5 mm Number of attempts: 1 Airway Equipment and Method: Patient positioned with wedge pillow Placement Confirmation: ETT inserted through vocal cords under direct vision, CO2 detector and Breath sounds checked- equal and bilateral Secured at: 24 cm Tube secured with: ETT holder Dental Injury: Bloody posterior oropharynx  Difficulty Due To: Difficult Airway- due to anterior larynx Future Recommendations: Recommend- induction with short-acting agent, and alternative techniques readily available   CRITICAL CARE Performed  by: Karen Chafe Elvy Mclarty Total critical care time: 75 minutes Critical care time was exclusive of separately billable procedures and treating other patients. Critical care was necessary to treat or prevent imminent or life-threatening deterioration. Critical care was time spent personally by me on the following activities: development of treatment plan with patient and/or surrogate as well as nursing, discussions with consultants, evaluation of patient's response to treatment, examination of patient, obtaining history from patient or surrogate, ordering and performing treatments and interventions, ordering and review of laboratory studies, ordering and review of radiographic studies, pulse oximetry and re-evaluation of patient's condition.  ED DIAGNOSES     ICD-10-CM   1. Alcohol withdrawal seizure with delirium ([place])  F10.931    R56.9     2. Hypokalemia  E87.6     3. Hypocalcemia  E83.51     4. Transient apnea  R06.81     5. Expected difficult intubation  Z78.9     6. Endotracheally intubated  Z97.8 Portable Chest xray    Portable Chest xray    7. Cannabis abuse  F12.10     8. Lower urinary tract infectious disease  N39.0          Maximina Pirozzi, MD 08/31/21  0202    Shanon Rosser, MD 08/31/21 0309    Shanon Rosser, MD 08/31/21 2446    Shanon Rosser, MD 08/31/21 0549    Antinio Sanderfer, Jenny Reichmann, MD 08/31/21 [phone]

## 2021-08-30 NOTE — ED Notes (Signed)
Patient is too agitated at this time to complete triage or obtain blood work. Combative with EMS. Once medication can take effect, will continue with care.

## 2021-08-30 NOTE — ED Triage Notes (Signed)
Patient arrives from home via EMS. EMS was called out to scene for possible seizure-like activity. When EMS arrived on scene, patient was throwing herself around in the recliner. No seizure activity per EMS. Patient's family unable to give any detail or history. Family was hysterical on scene and PD had to escort them out of the house. Patient was combative and uncooperative on scene, 5 mg IM versed administered with little effect. Patient did complain of leg pain to family at one point. Patient is a chronic drinker, who has been on an alcohol binge until 3 days ago when she suddenly stopped. Patient has been through DTs multiple times with similar presentation. Patient is not answering questions or following commands at this time. Patient is moaning and groaning in bed, only stating "Oh god," or "oh god it hurts."

## 2021-08-31 ENCOUNTER — Inpatient Hospital Stay (HOSPITAL_COMMUNITY): Payer: Medicare Other | Admitting: Certified Registered Nurse Anesthetist

## 2021-08-31 ENCOUNTER — Inpatient Hospital Stay (HOSPITAL_COMMUNITY)
Admit: 2021-08-31 | Discharge: 2021-08-31 | Disposition: A | Discharge status: Home / Self Care | Payer: Medicare Other | Attending: Internal Medicine | Admitting: Internal Medicine

## 2021-08-31 ENCOUNTER — Inpatient Hospital Stay (HOSPITAL_COMMUNITY): Payer: Medicare Other

## 2021-08-31 ENCOUNTER — Emergency Department (HOSPITAL_COMMUNITY): Payer: Medicare Other

## 2021-08-31 DIAGNOSIS — I130 Hypertensive heart and chronic kidney disease with heart failure and stage 1 through stage 4 chronic kidney disease, or unspecified chronic kidney disease: Secondary | ICD-10-CM | POA: Diagnosis present

## 2021-08-31 DIAGNOSIS — K7030 Alcoholic cirrhosis of liver without ascites: Secondary | ICD-10-CM | POA: Diagnosis not present

## 2021-08-31 DIAGNOSIS — F10939 Alcohol use, unspecified with withdrawal, unspecified: Secondary | ICD-10-CM

## 2021-08-31 DIAGNOSIS — T17910A Gastric contents in respiratory tract, part unspecified causing asphyxiation, initial encounter: Secondary | ICD-10-CM | POA: Diagnosis present

## 2021-08-31 DIAGNOSIS — F10931 Alcohol use, unspecified with withdrawal delirium: Secondary | ICD-10-CM | POA: Diagnosis not present

## 2021-08-31 DIAGNOSIS — I5032 Chronic diastolic (congestive) heart failure: Secondary | ICD-10-CM

## 2021-08-31 DIAGNOSIS — R569 Unspecified convulsions: Secondary | ICD-10-CM

## 2021-08-31 DIAGNOSIS — Z6841 Body Mass Index (BMI) 40.0 and over, adult: Secondary | ICD-10-CM | POA: Diagnosis not present

## 2021-08-31 DIAGNOSIS — J9601 Acute respiratory failure with hypoxia: Secondary | ICD-10-CM

## 2021-08-31 DIAGNOSIS — D631 Anemia in chronic kidney disease: Secondary | ICD-10-CM | POA: Diagnosis present

## 2021-08-31 DIAGNOSIS — K7460 Unspecified cirrhosis of liver: Secondary | ICD-10-CM | POA: Diagnosis not present

## 2021-08-31 DIAGNOSIS — Z9911 Dependence on respirator [ventilator] status: Secondary | ICD-10-CM | POA: Diagnosis not present

## 2021-08-31 DIAGNOSIS — I959 Hypotension, unspecified: Secondary | ICD-10-CM | POA: Diagnosis not present

## 2021-08-31 DIAGNOSIS — N1831 Chronic kidney disease, stage 3a: Secondary | ICD-10-CM

## 2021-08-31 DIAGNOSIS — G4089 Other seizures: Secondary | ICD-10-CM | POA: Diagnosis present

## 2021-08-31 DIAGNOSIS — N179 Acute kidney failure, unspecified: Secondary | ICD-10-CM | POA: Diagnosis present

## 2021-08-31 DIAGNOSIS — G9349 Other encephalopathy: Secondary | ICD-10-CM | POA: Diagnosis present

## 2021-08-31 DIAGNOSIS — N184 Chronic kidney disease, stage 4 (severe): Secondary | ICD-10-CM | POA: Diagnosis present

## 2021-08-31 DIAGNOSIS — I700 Atherosclerosis of aorta: Secondary | ICD-10-CM | POA: Diagnosis present

## 2021-08-31 DIAGNOSIS — Z20822 Contact with and (suspected) exposure to covid-19: Secondary | ICD-10-CM | POA: Diagnosis present

## 2021-08-31 DIAGNOSIS — G8929 Other chronic pain: Secondary | ICD-10-CM | POA: Diagnosis present

## 2021-08-31 DIAGNOSIS — F1210 Cannabis abuse, uncomplicated: Secondary | ICD-10-CM | POA: Diagnosis present

## 2021-08-31 DIAGNOSIS — Z8616 Personal history of COVID-19: Secondary | ICD-10-CM | POA: Diagnosis not present

## 2021-08-31 DIAGNOSIS — K7010 Alcoholic hepatitis without ascites: Secondary | ICD-10-CM | POA: Diagnosis present

## 2021-08-31 DIAGNOSIS — I10 Essential (primary) hypertension: Secondary | ICD-10-CM | POA: Diagnosis not present

## 2021-08-31 DIAGNOSIS — N390 Urinary tract infection, site not specified: Secondary | ICD-10-CM

## 2021-08-31 DIAGNOSIS — F10231 Alcohol dependence with withdrawal delirium: Secondary | ICD-10-CM | POA: Diagnosis present

## 2021-08-31 DIAGNOSIS — D696 Thrombocytopenia, unspecified: Secondary | ICD-10-CM | POA: Diagnosis present

## 2021-08-31 DIAGNOSIS — E440 Moderate protein-calorie malnutrition: Secondary | ICD-10-CM | POA: Diagnosis present

## 2021-08-31 LAB — COMPREHENSIVE METABOLIC PANEL
ALT: 35 U/L (ref 0–44)
AST: 59 U/L — ABNORMAL HIGH (ref 15–41)
Albumin: 3.8 g/dL (ref 3.5–5.0)
Alkaline Phosphatase: 60 U/L (ref 38–126)
Anion gap: 16 — ABNORMAL HIGH (ref 5–15)
BUN: 16 mg/dL (ref 8–23)
CO2: 21 mmol/L — ABNORMAL LOW (ref 22–32)
Calcium: 8.8 mg/dL — ABNORMAL LOW (ref 8.9–10.3)
Chloride: 101 mmol/L (ref 98–111)
Creatinine, Ser: 1.51 mg/dL — ABNORMAL HIGH (ref 0.44–1.00)
GFR, Estimated: 38 mL/min — ABNORMAL LOW (ref >60)
Glucose, Bld: 156 mg/dL — ABNORMAL HIGH (ref 70–99)
Potassium: 3.1 mmol/L — ABNORMAL LOW (ref 3.5–5.1)
Sodium: 138 mmol/L (ref 135–145)
Total Bilirubin: 1.6 mg/dL — ABNORMAL HIGH (ref 0.3–1.2)
Total Protein: 8.2 g/dL — ABNORMAL HIGH (ref 6.5–8.1)

## 2021-08-31 LAB — HEMOGLOBIN A1C
Hgb A1c MFr Bld: 5.1 % (ref 4.8–5.6)
Mean Plasma Glucose: 99.67 mg/dL

## 2021-08-31 LAB — GLUCOSE, CAPILLARY
Glucose-Capillary: 121 mg/dL — ABNORMAL HIGH (ref 70–99)
Glucose-Capillary: 99 mg/dL (ref 70–99)
Glucose-Capillary: 97 mg/dL (ref 70–99)
Glucose-Capillary: 85 mg/dL (ref 70–99)

## 2021-08-31 LAB — URINALYSIS, ROUTINE W REFLEX MICROSCOPIC
Bilirubin Urine: NEGATIVE
Glucose, UA: NEGATIVE mg/dL
Ketones, ur: NEGATIVE mg/dL
Nitrite: POSITIVE — AB
Protein, ur: 100 mg/dL — AB
Specific Gravity, Urine: 1.009 (ref 1.005–1.030)
WBC, UA: >50 WBC/hpf — ABNORMAL HIGH (ref 0–5)
pH: 6 (ref 5.0–8.0)

## 2021-08-31 LAB — CBC WITH DIFFERENTIAL/PLATELET
Abs Immature Granulocytes: 0.44 10*3/uL — ABNORMAL HIGH (ref 0.00–0.07)
Basophils Absolute: 0 10*3/uL (ref 0.0–0.1)
Basophils Relative: 0 %
Eosinophils Absolute: 0.1 10*3/uL (ref 0.0–0.5)
Eosinophils Relative: 1 %
HCT: 35.7 % — ABNORMAL LOW (ref 36.0–46.0)
Hemoglobin: 11.9 g/dL — ABNORMAL LOW (ref 12.0–15.0)
Immature Granulocytes: 5 %
Lymphocytes Relative: 9 %
Lymphs Abs: 0.8 10*3/uL (ref 0.7–4.0)
MCH: 32.3 pg (ref 26.0–34.0)
MCHC: 33.3 g/dL (ref 30.0–36.0)
MCV: 97 fL (ref 80.0–100.0)
Monocytes Absolute: 0.4 10*3/uL (ref 0.1–1.0)
Monocytes Relative: 4 %
Neutro Abs: 8 10*3/uL — ABNORMAL HIGH (ref 1.7–7.7)
Neutrophils Relative %: 81 %
Platelets: 132 10*3/uL — ABNORMAL LOW (ref 150–400)
RBC: 3.68 MIL/uL — ABNORMAL LOW (ref 3.87–5.11)
RDW: 12.9 % (ref 11.5–15.5)
WBC: 9.9 10*3/uL (ref 4.0–10.5)
nRBC: 0 % (ref 0.0–0.2)

## 2021-08-31 LAB — AMMONIA: Ammonia: 57 umol/L — ABNORMAL HIGH (ref 9–35)

## 2021-08-31 LAB — BLOOD GAS, ARTERIAL
Acid-Base Excess: 1.2 mmol/L (ref 0.0–2.0)
Bicarbonate: 26.5 mmol/L (ref 20.0–28.0)
O2 Saturation: 99.9 %
Patient temperature: 98.6
pCO2 arterial: 47.3 mmHg (ref 32.0–48.0)
pH, Arterial: 7.366 (ref 7.350–7.450)
pO2, Arterial: 307 mmHg — ABNORMAL HIGH (ref 83.0–108.0)

## 2021-08-31 LAB — RAPID URINE DRUG SCREEN, HOSP PERFORMED
Amphetamines: NOT DETECTED
Barbiturates: NOT DETECTED
Benzodiazepines: POSITIVE — AB
Cocaine: NOT DETECTED
Opiates: NOT DETECTED
Tetrahydrocannabinol: POSITIVE — AB

## 2021-08-31 LAB — MAGNESIUM: Magnesium: 1.4 mg/dL — ABNORMAL LOW (ref 1.7–2.4)

## 2021-08-31 LAB — RESP PANEL BY RT-PCR (FLU A&B, COVID) ARPGX2
Influenza A by PCR: NEGATIVE
Influenza B by PCR: NEGATIVE
SARS Coronavirus 2 by RT PCR: NEGATIVE

## 2021-08-31 LAB — MRSA NEXT GEN BY PCR, NASAL: MRSA by PCR Next Gen: NOT DETECTED

## 2021-08-31 LAB — ETHANOL: Alcohol, Ethyl (B): <10 mg/dL (ref <10)

## 2021-08-31 IMAGING — DX DG CHEST 1V PORT
2 series · 2 of 2 positions shown · non-contrast
Comparison: Portable chest [DATE].

CLINICAL DATA: Check intubation and enteric tube positioning.

EXAM:
PORTABLE CHEST 1 VIEW

[chest ap (1 of 2)]
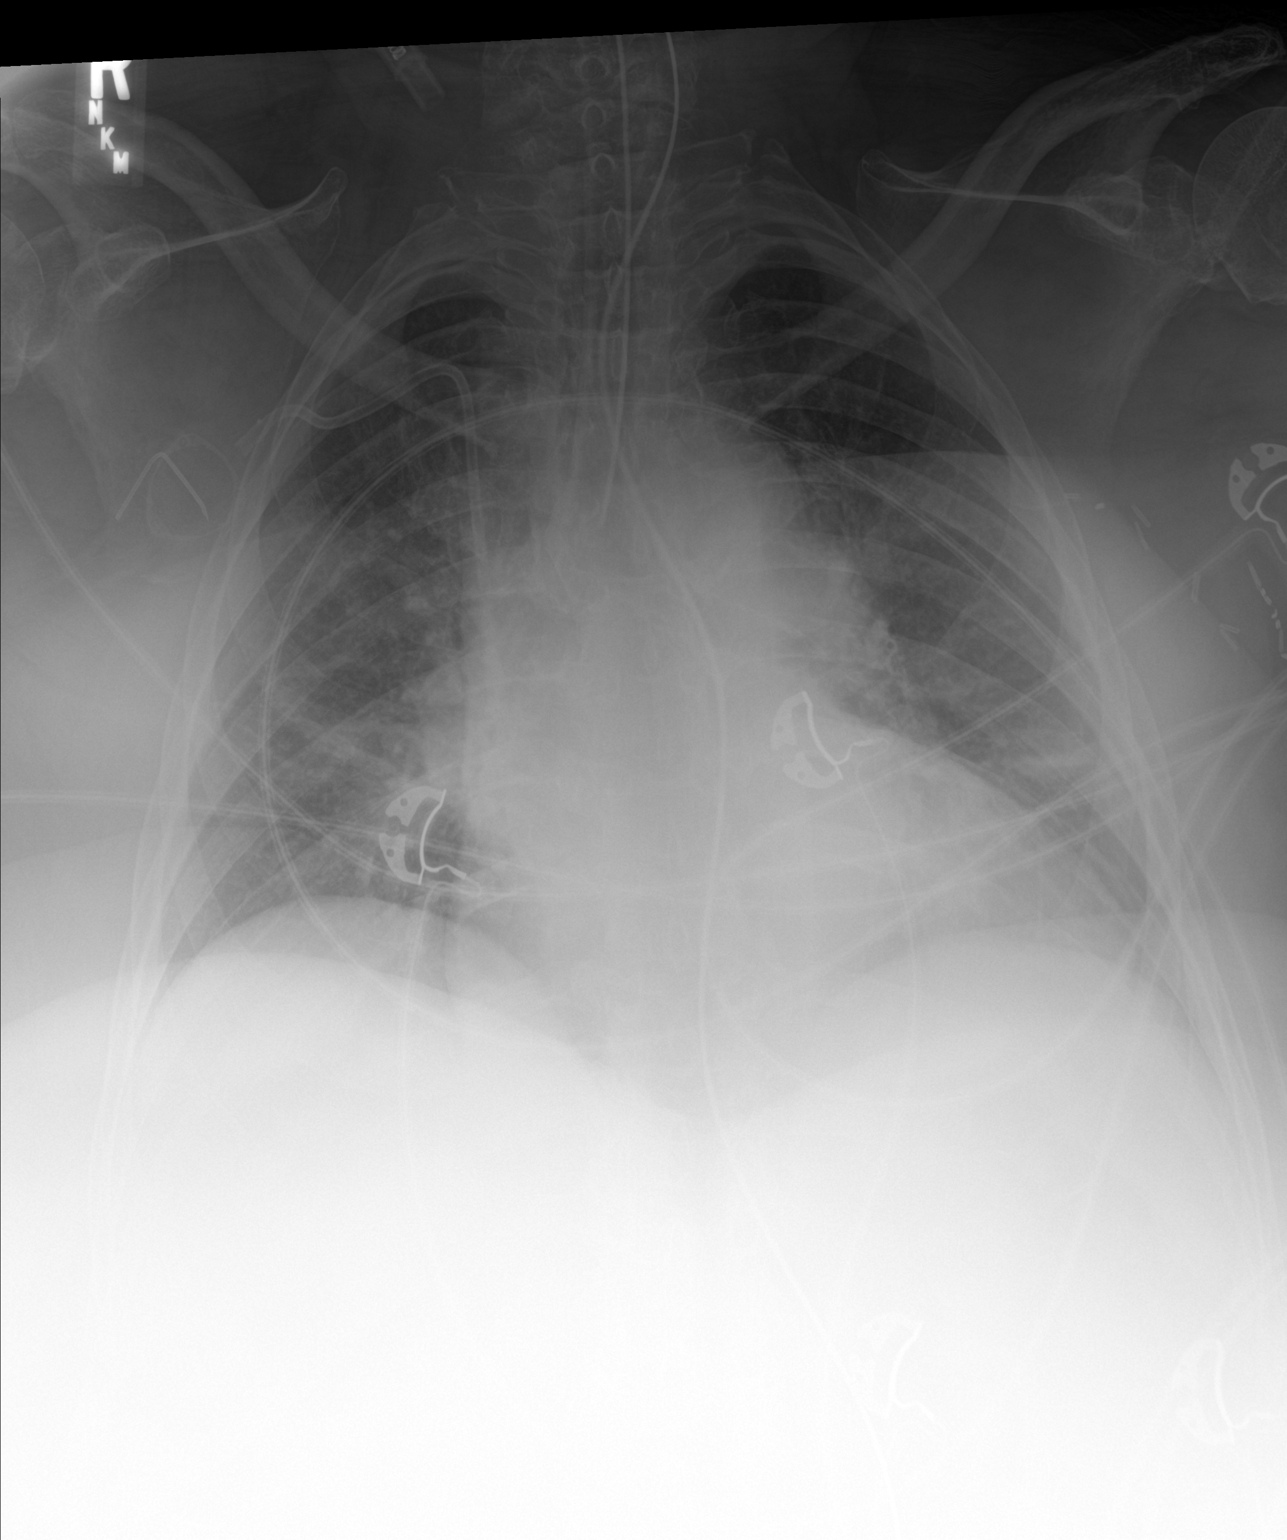

[chest ap (2 of 2)]
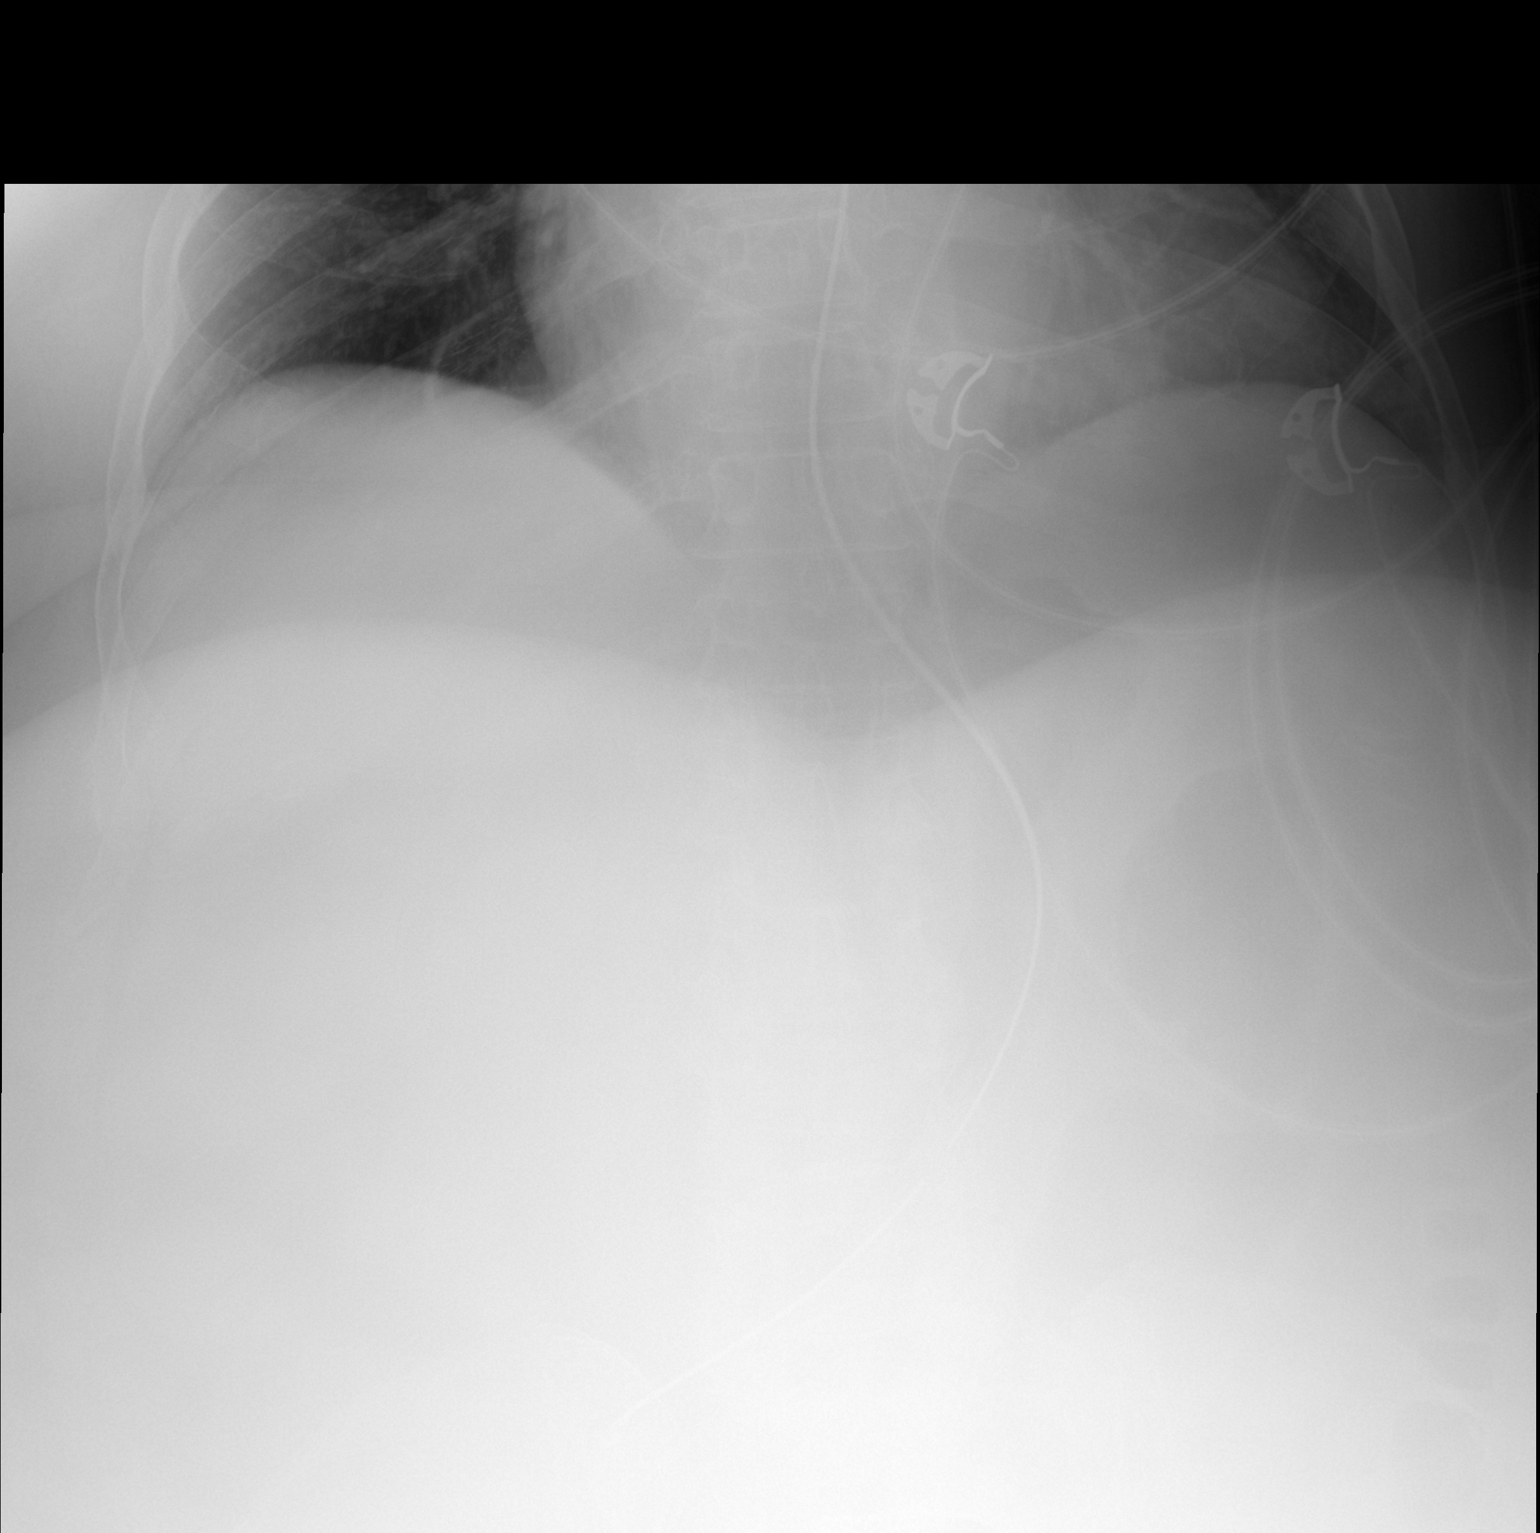

[2 of 2 positions shown; findings below may reference images not displayed]

FINDINGS: Enteric tube is well inside the stomach but neither the proximal
side-hole or tube terminus are included in the exam.

ETT tip is 1.5 cm over the carina. Old right subclavian port
catheter tip remains at the cavoatrial junction.

There is moderate cardiomegaly. Mild perihilar vascular congestion
is noted without evidence of overt edema. There is a low inspiration
with perihilar atelectatic bands but no convincing focal pneumonia,
with limited view of the lower zones.

No significant pleural effusion is seen. The aerated lungs are
otherwise clear. There are left axillary surgical clips.
IMPRESSION: 1. ETT tip is 1.5 cm from the carina. NGT is well inside the stomach
but the proximal side hole and tip are not included.
2. Cardiomegaly with mild perihilar vascular congestion without
overt edema.
3. Limited evaluation of the lungs due to low lung volumes. No
convincing focal pneumonia. Follow-up study recommended in full
inspiration.

## 2021-08-31 IMAGING — US US ABDOMEN LIMITED
1 series · 15 of 25 positions shown · non-contrast
Comparison: Previous studies including the CT done on [DATE]

CLINICAL DATA: Cirrhosis

EXAM:
ULTRASOUND ABDOMEN LIMITED RIGHT UPPER QUADRANT

[Series 1: us abdomen limited ruq mc & wl · 15 of 67 slices shown]
[im 1/67]
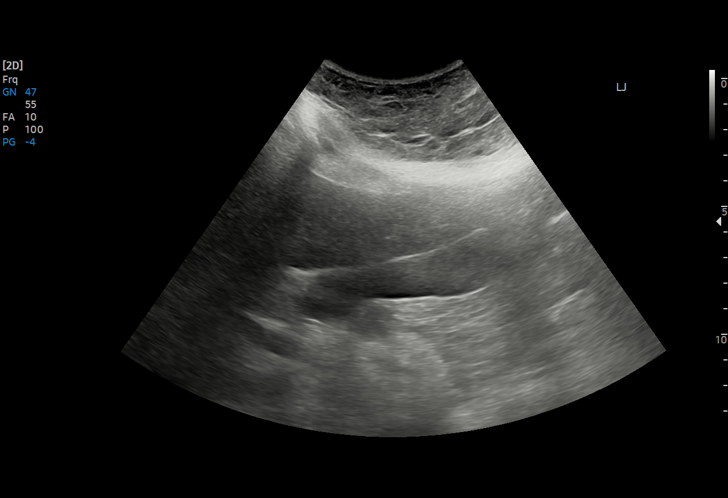
[im 6/67]
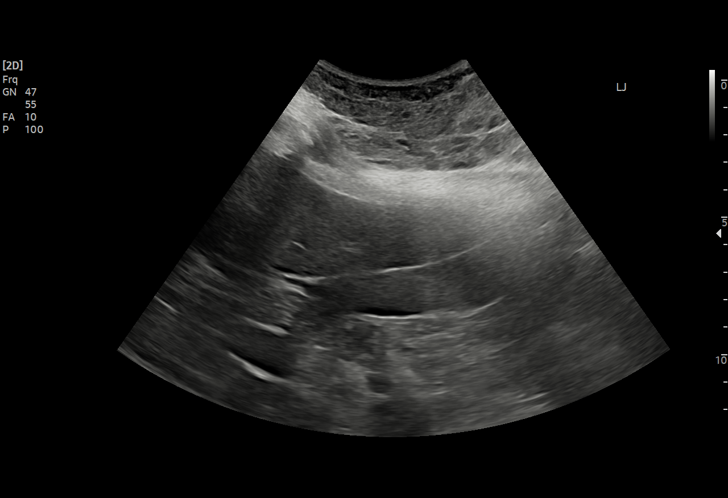
[im 12/67]
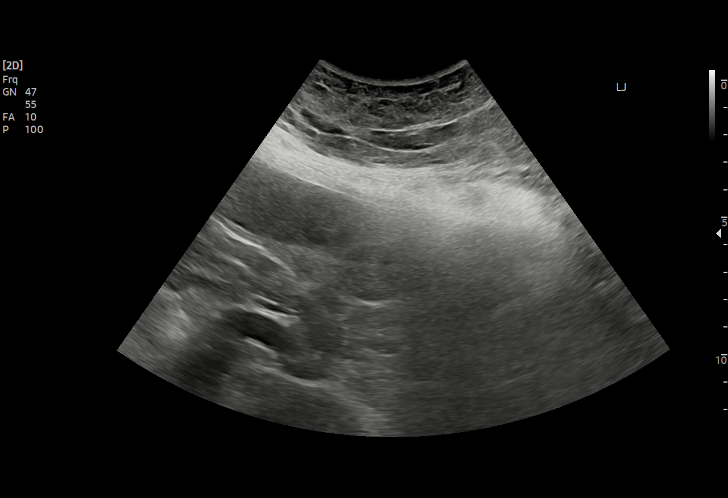
[im 14/67]
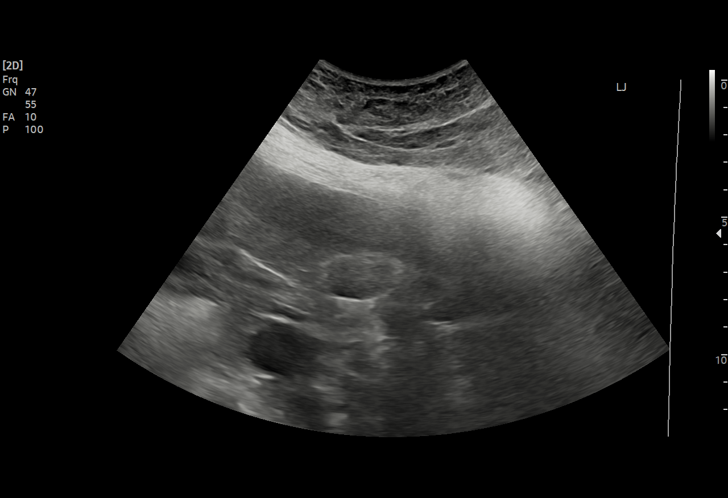
[im 20/67]
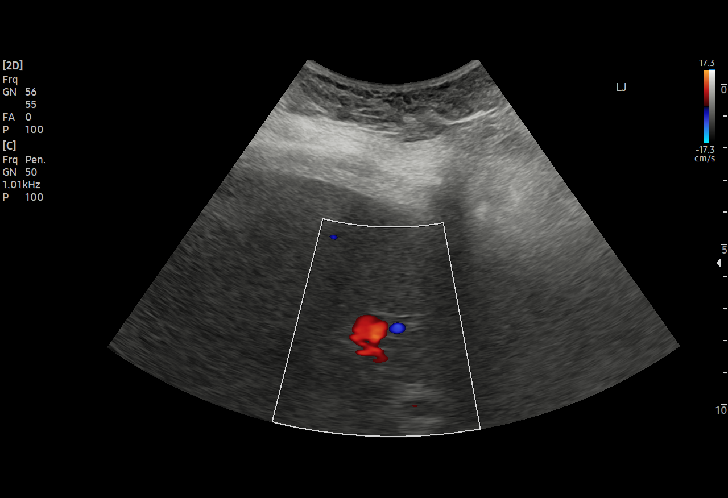
[im 25/67]
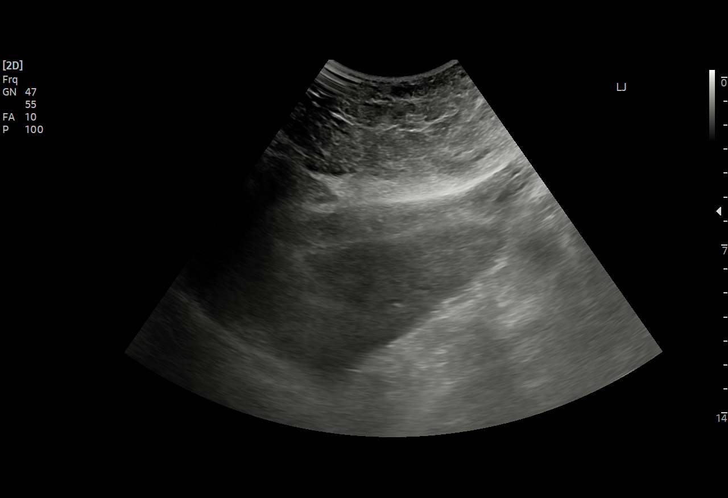
[im 28/67]
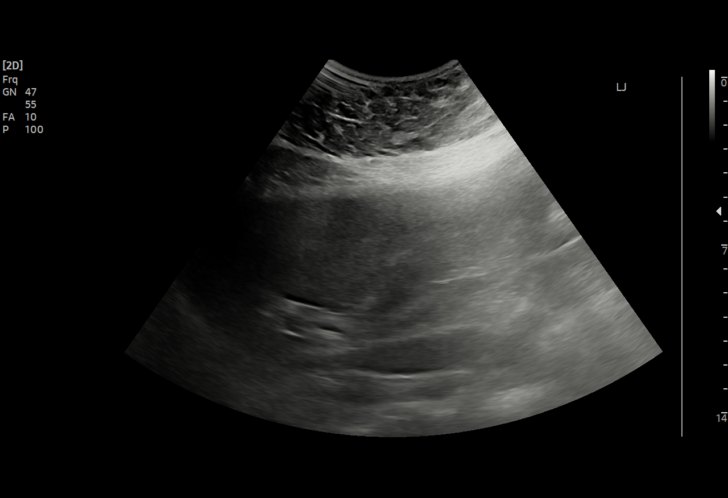
[im 34/67]
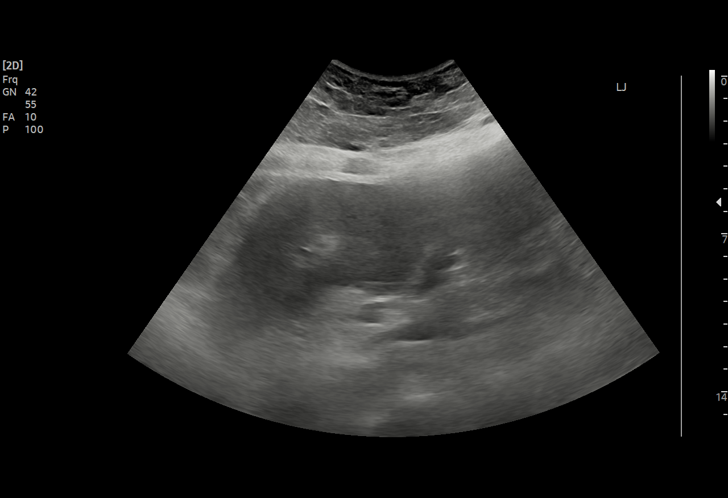
[im 39/67]
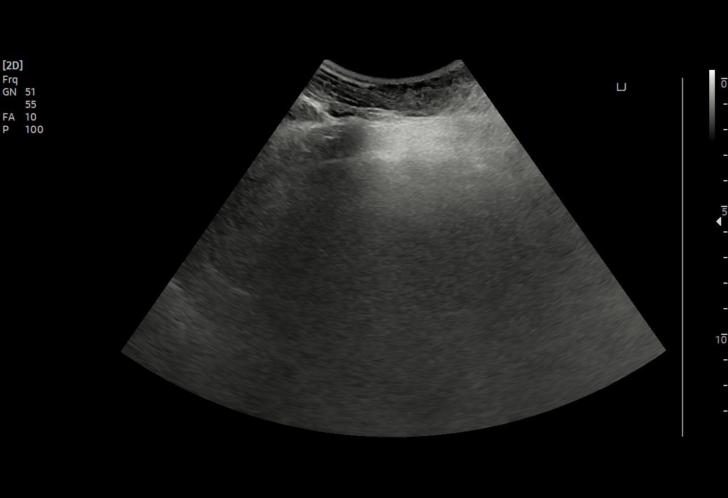
[im 42/67]
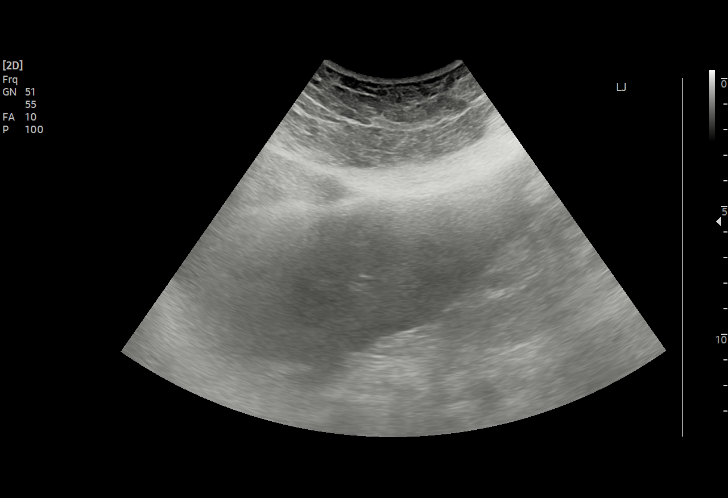
[im 47/67]
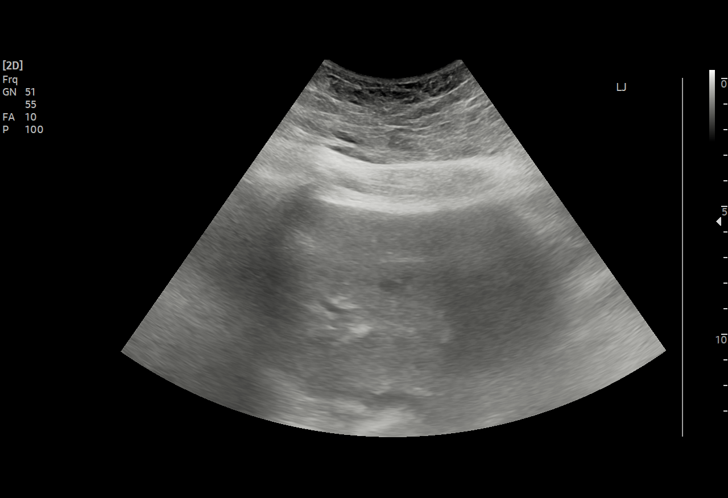
[im 53/67]
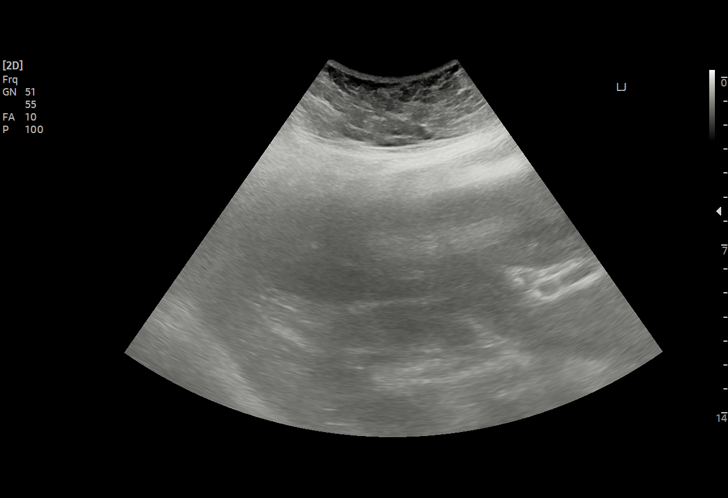
[im 56/67]
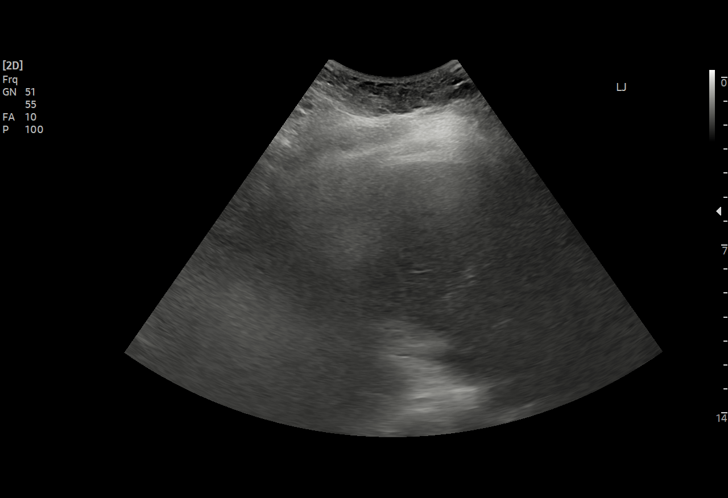
[im 61/67]
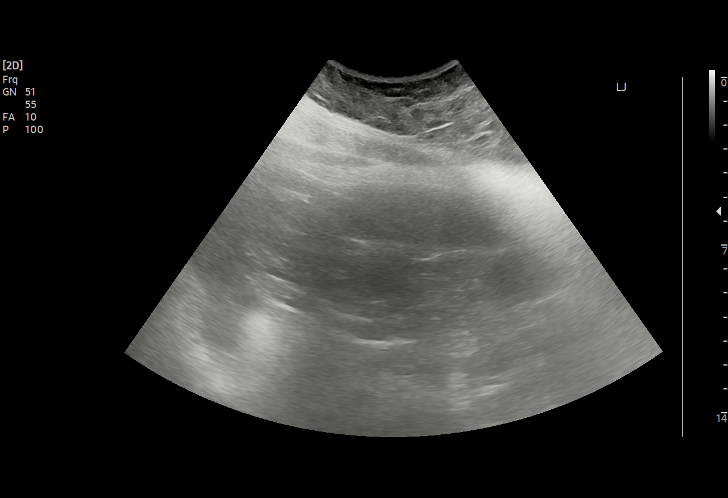
[im 67/67]
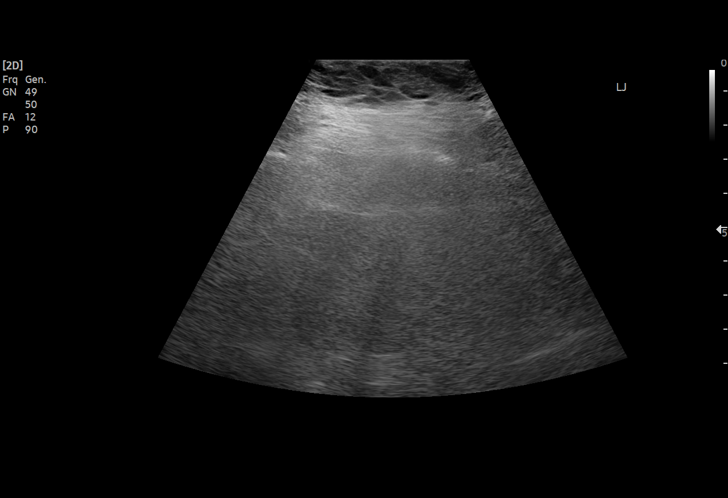

[15 of 25 positions shown; findings below may reference images not displayed]

FINDINGS: Gallbladder:

No definite gallbladder stones are seen. There low-level echoes in
the lumen of gallbladder. There is no significant wall thickening.
Technologist did not observe any tenderness over the gallbladder.
There is no fluid around the gallbladder.

Common bile duct:

Diameter: 2 mm

Liver:

There is minimal nodularity in the liver surface. Visualized
portions of liver show no focal abnormalities. Portal vein is patent
on color Doppler imaging with normal direction of blood flow towards
the liver.

Other: None.
IMPRESSION: Possible sludge is seen in the lumen of gallbladder. There are no
definite demonstrable gallbladder stones. There are no signs of
acute cholecystitis.

## 2021-08-31 IMAGING — CT CT HEAD W/O CM
3 of 4 series · 14 of 47 positions shown, 16 images · non-contrast
Comparison: [DATE]

CLINICAL DATA: Seizure-like activity.

EXAM:
CT HEAD WITHOUT CONTRAST
TECHNIQUE: Contiguous axial images were obtained from the base of the skull
through the vertex without intravenous contrast.

[Series 5: coronal soft tissue · coronal · 0.33mm/px · 3 of 68 slices shown]
[im 23/68  brain]
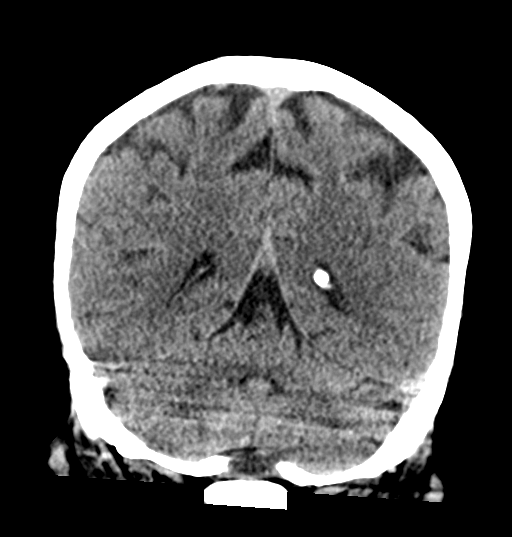
[im 30/68  brain]
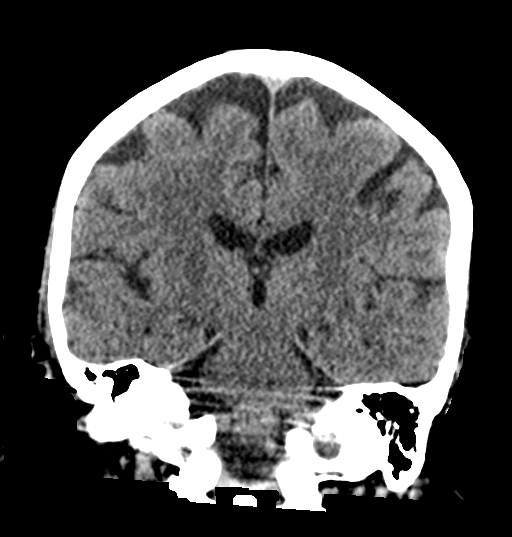
[im 38/68  brain]
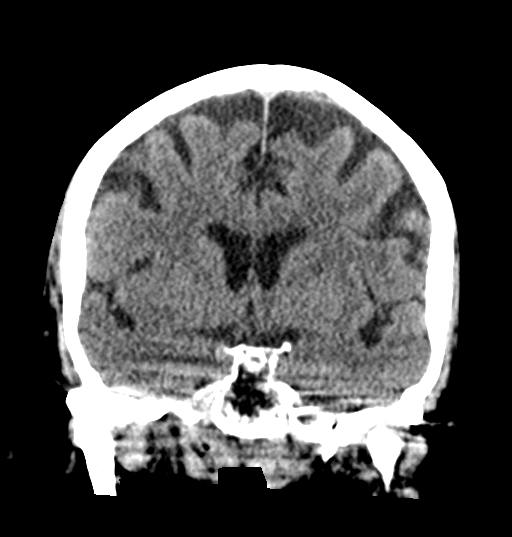

[Series 6: sagittal soft tissue · sagittal · 0.34mm/px · 3 of 56 slices shown]
[im 19/56  brain]
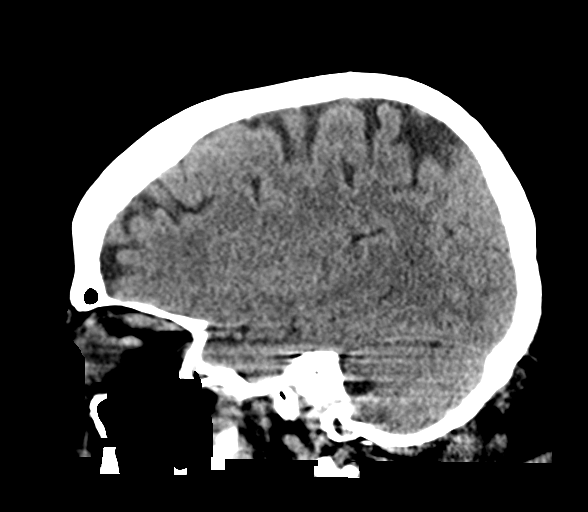
[im 28/56  brain]
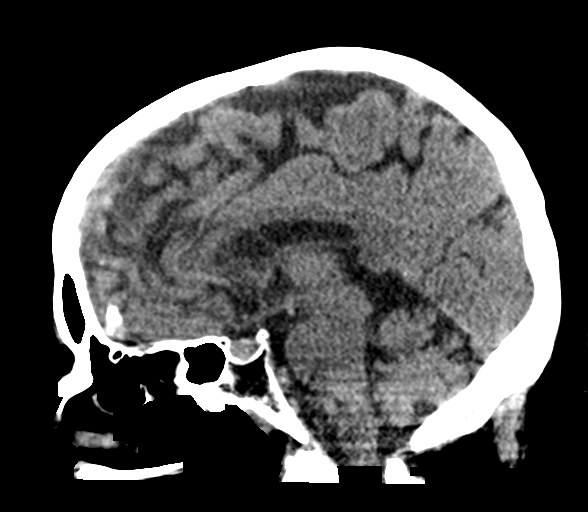
[im 37/56  brain]
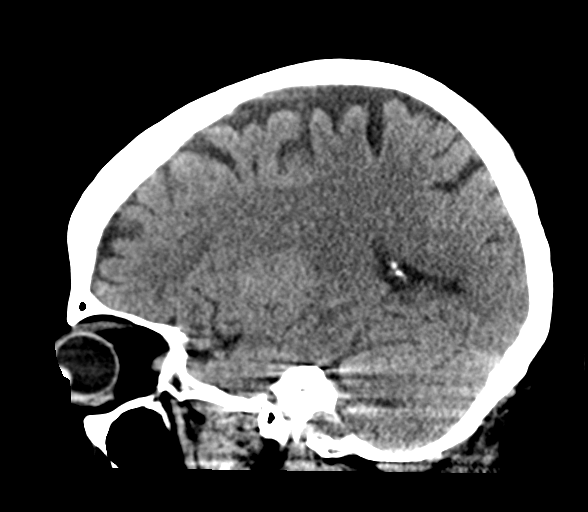

[Series 7: true axial · axial · 0.33mm/px · z∈[-141,-12]mm · 8 of 58 slices shown, 10 images]
[im 7/58  brain]
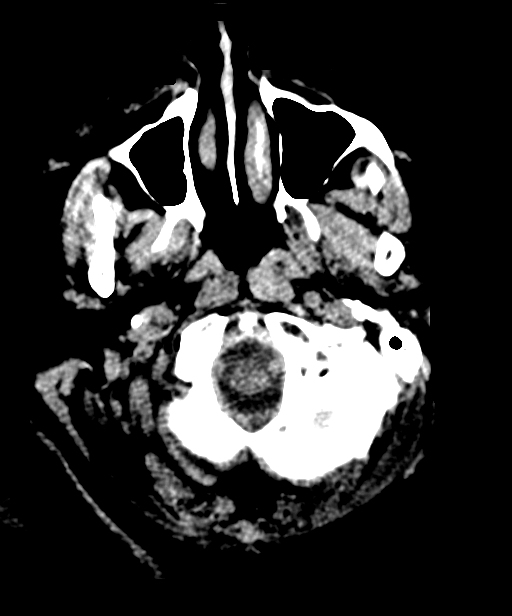
[im 7/58  bone]
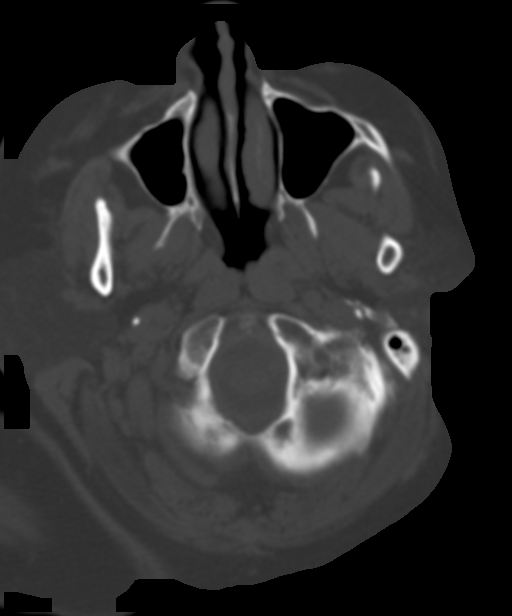
[im 13/58  brain]
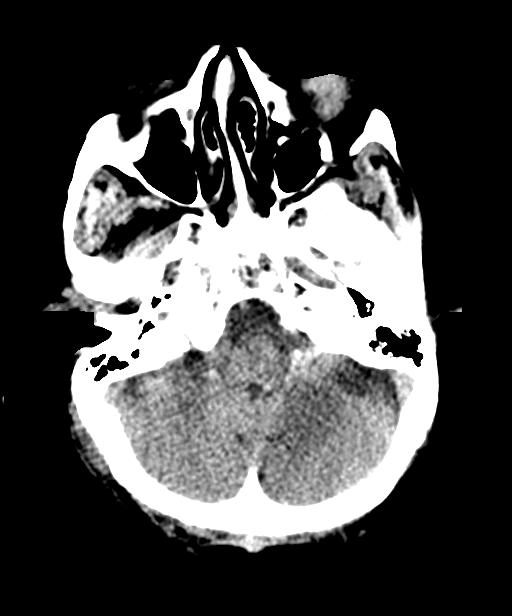
[im 20/58  brain]
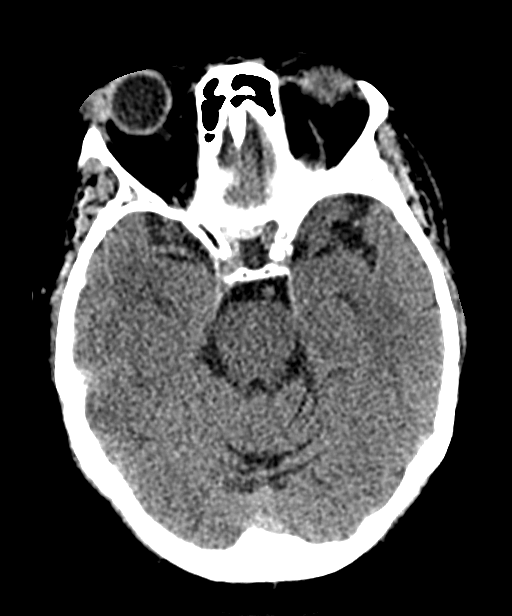
[im 26/58  brain]
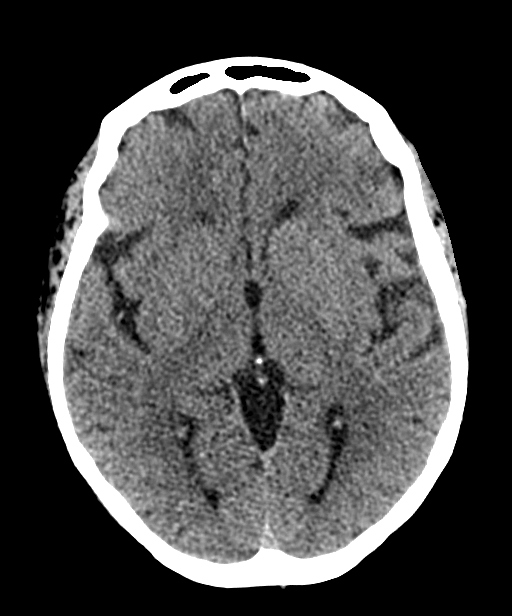
[im 32/58  brain]
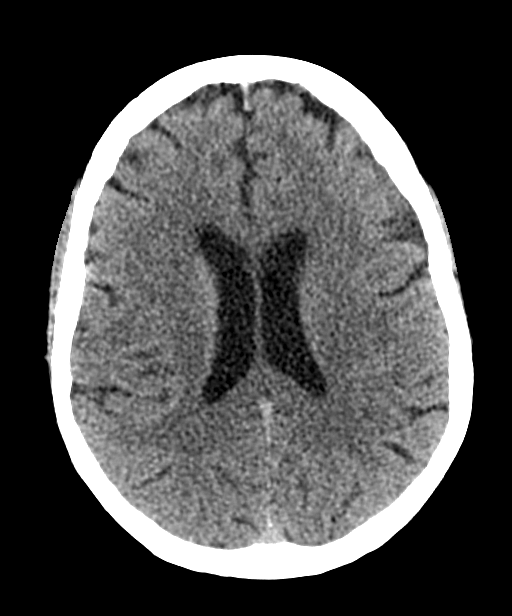
[im 32/58  bone]
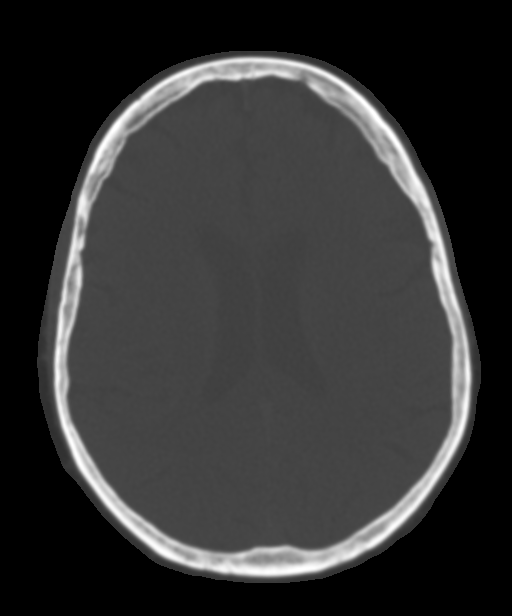
[im 39/58  brain]
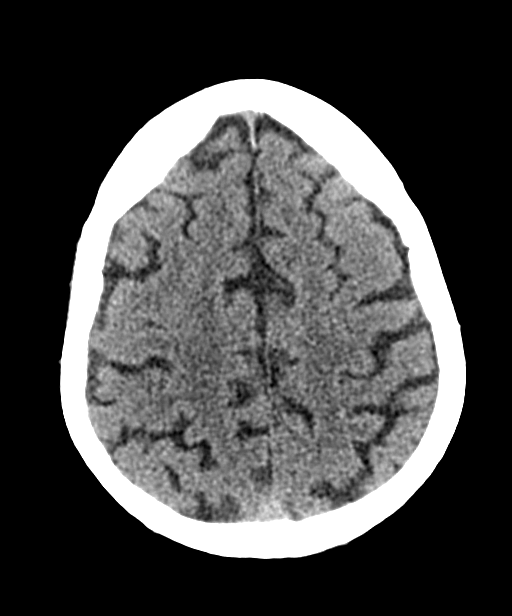
[im 45/58  brain]
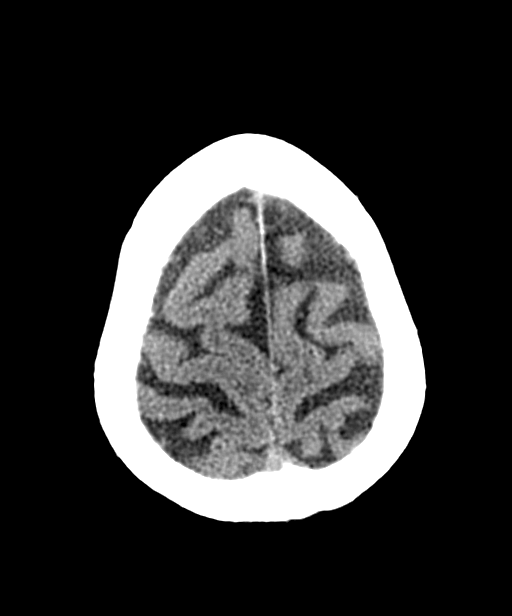
[im 51/58  brain]
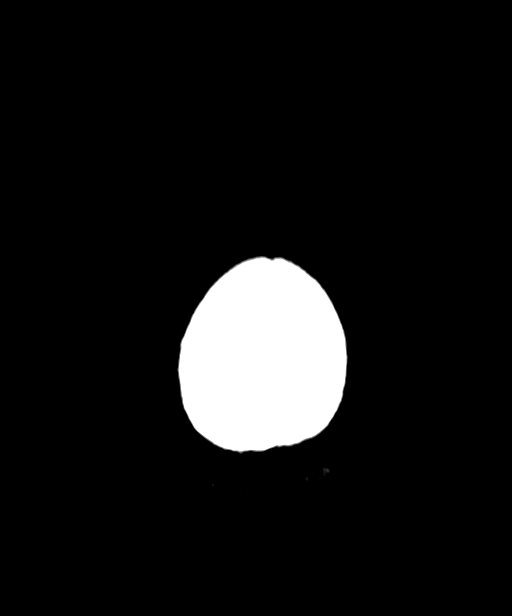

[14 of 47 positions shown; findings below may reference images not displayed]

FINDINGS: Brain: No evidence of acute infarction, hemorrhage, hydrocephalus,
extra-axial collection or mass lesion/mass effect.

Vascular: No hyperdense vessel or unexpected calcification.

Skull: Normal. Negative for fracture or focal lesion.

Sinuses/Orbits: There is a 2.4 cm x 1.5 cm left maxillary sinus
polyp versus mucous retention cyst.

Other: None.
IMPRESSION: 1. No acute intracranial pathology.
2. Left maxillary sinus polyp versus mucous retention cyst.

## 2021-08-31 MED ORDER — DEXTROSE IN LACTATED RINGERS 5 % IV SOLN: INTRAVENOUS | Status: DC

## 2021-08-31 MED ORDER — LORAZEPAM 2 MG/ML IJ SOLN
2.0000 mg | Freq: Once | INTRAMUSCULAR | Status: AC
  Administered 2021-08-31: 2 mg via INTRAVENOUS

## 2021-08-31 MED ORDER — POTASSIUM CHLORIDE 10 MEQ/100ML IV SOLN
10.0000 meq | INTRAVENOUS | Status: AC
  Administered 2021-08-31 (×2): 10 meq via INTRAVENOUS
  Filled 2021-08-31 (×3): qty 100

## 2021-08-31 MED ORDER — FOLIC ACID 5 MG/ML IJ SOLN
1.0000 mg | Freq: Every day | INTRAMUSCULAR | Status: DC
  Administered 2021-09-01 – 2021-09-09 (×9): 1 mg via INTRAVENOUS
  Filled 2021-08-31 (×11): qty 0.2

## 2021-08-31 MED ORDER — THIAMINE HCL 100 MG/ML IJ SOLN
100.0000 mg | Freq: Every day | INTRAMUSCULAR | Status: DC
  Administered 2021-08-31 – 2021-09-09 (×10): 100 mg via INTRAVENOUS
  Filled 2021-08-31 (×10): qty 2

## 2021-08-31 MED ORDER — CARVEDILOL 12.5 MG PO TABS
12.5000 mg | ORAL_TABLET | Freq: Two times a day (BID) | ORAL | Status: DC
  Administered 2021-08-31: 12.5 mg via NASOGASTRIC
  Filled 2021-08-31: qty 1

## 2021-08-31 MED ORDER — DOCUSATE SODIUM 50 MG/5ML PO LIQD
100.0000 mg | Freq: Two times a day (BID) | ORAL | Status: DC
  Administered 2021-08-31 – 2021-09-03 (×7): 100 mg
  Filled 2021-08-31 (×8): qty 10

## 2021-08-31 MED ORDER — PROPOFOL 1000 MG/100ML IV EMUL: 0.0000 ug/kg/min | INTRAVENOUS | Status: DC

## 2021-08-31 MED ORDER — FENTANYL CITRATE PF 50 MCG/ML IJ SOSY
25.0000 ug | PREFILLED_SYRINGE | INTRAMUSCULAR | Status: DC | PRN
  Administered 2021-08-31: 100 ug via INTRAVENOUS
  Filled 2021-08-31 (×2): qty 2

## 2021-08-31 MED ORDER — STERILE WATER FOR INJECTION IJ SOLN
INTRAMUSCULAR | Status: AC
  Filled 2021-08-31: qty 10

## 2021-08-31 MED ORDER — ORAL CARE MOUTH RINSE: 15.0000 mL | OROMUCOSAL | Status: DC

## 2021-08-31 MED ORDER — FENTANYL BOLUS VIA INFUSION
25.0000 ug | INTRAVENOUS | Status: DC | PRN
  Administered 2021-08-31: 25 ug via INTRAVENOUS
  Administered 2021-08-31 – 2021-09-01 (×6): 100 ug via INTRAVENOUS
  Filled 2021-08-31: qty 100

## 2021-08-31 MED ORDER — SODIUM CHLORIDE 0.9 % IV SOLN: Freq: Once | INTRAVENOUS | Status: AC

## 2021-08-31 MED ORDER — PANTOPRAZOLE SODIUM 40 MG IV SOLR
40.0000 mg | Freq: Every day | INTRAVENOUS | Status: DC
  Administered 2021-08-31 – 2021-09-06 (×7): 40 mg via INTRAVENOUS
  Filled 2021-08-31 (×7): qty 40

## 2021-08-31 MED ORDER — DIAZEPAM 5 MG/ML IJ SOLN
INTRAMUSCULAR | Status: AC
  Administered 2021-08-31: 10 mg
  Filled 2021-08-31: qty 2

## 2021-08-31 MED ORDER — DOCUSATE SODIUM 50 MG/5ML PO LIQD
100.0000 mg | Freq: Two times a day (BID) | ORAL | Status: DC
  Filled 2021-08-31: qty 10

## 2021-08-31 MED ORDER — CHLORHEXIDINE GLUCONATE 0.12% ORAL RINSE (MEDLINE KIT): 15.0000 mL | Freq: Two times a day (BID) | OROMUCOSAL | Status: DC

## 2021-08-31 MED ORDER — CHLORHEXIDINE GLUCONATE CLOTH 2 % EX PADS
6.0000 | MEDICATED_PAD | Freq: Every day | CUTANEOUS | Status: DC
  Administered 2021-08-31 – 2021-09-11 (×11): 6 via TOPICAL

## 2021-08-31 MED ORDER — MIDAZOLAM HCL 2 MG/2ML IJ SOLN: 4.0000 mg | INTRAMUSCULAR | Status: DC | PRN

## 2021-08-31 MED ORDER — FENTANYL 2500MCG IN NS 250ML (10MCG/ML) PREMIX INFUSION
25.0000 ug/h | INTRAVENOUS | Status: DC
  Administered 2021-08-31: 25 ug/h via INTRAVENOUS
  Administered 2021-08-31: 125 ug/h via INTRAVENOUS
  Administered 2021-09-01: 175 ug/h via INTRAVENOUS
  Filled 2021-08-31 (×3): qty 250

## 2021-08-31 MED ORDER — PROPOFOL 1000 MG/100ML IV EMUL: 5.0000 ug/kg/min | INTRAVENOUS | Status: DC

## 2021-08-31 MED ORDER — LORAZEPAM 2 MG/ML IJ SOLN
INTRAMUSCULAR | Status: AC
  Filled 2021-08-31: qty 1

## 2021-08-31 MED ORDER — HEPARIN SODIUM (PORCINE) 5000 UNIT/ML IJ SOLN
5000.0000 [IU] | Freq: Three times a day (TID) | INTRAMUSCULAR | Status: DC
  Administered 2021-08-31 – 2021-09-12 (×37): 5000 [IU] via SUBCUTANEOUS
  Filled 2021-08-31 (×36): qty 1

## 2021-08-31 MED ORDER — SODIUM CHLORIDE 0.9 % IV SOLN
3.0000 g | Freq: Four times a day (QID) | INTRAVENOUS | Status: DC
  Administered 2021-08-31 – 2021-09-01 (×6): 3 g via INTRAVENOUS
  Filled 2021-08-31 (×9): qty 8

## 2021-08-31 MED ORDER — INSULIN ASPART 100 UNIT/ML IJ SOLN
0.0000 [IU] | INTRAMUSCULAR | Status: DC
  Administered 2021-08-31 – 2021-09-03 (×5): 2 [IU] via SUBCUTANEOUS
  Administered 2021-09-04: 3 [IU] via SUBCUTANEOUS
  Administered 2021-09-04 – 2021-09-06 (×9): 2 [IU] via SUBCUTANEOUS
  Filled 2021-08-31: qty 0.15

## 2021-08-31 MED ORDER — ORAL CARE MOUTH RINSE
15.0000 mL | OROMUCOSAL | Status: DC
  Administered 2021-08-31 – 2021-09-04 (×40): 15 mL via OROMUCOSAL

## 2021-08-31 MED ORDER — VECURONIUM BROMIDE 10 MG IV SOLR
INTRAVENOUS | Status: AC
  Filled 2021-08-31: qty 10

## 2021-08-31 MED ORDER — MIDAZOLAM HCL 2 MG/2ML IJ SOLN: 1.0000 mg | INTRAMUSCULAR | Status: DC | PRN

## 2021-08-31 MED ORDER — SUCCINYLCHOLINE CHLORIDE 200 MG/10ML IV SOSY
140.0000 mg | PREFILLED_SYRINGE | Freq: Once | INTRAVENOUS | Status: DC
  Administered 2021-08-31: 140 mg via INTRAVENOUS
  Filled 2021-08-31: qty 10

## 2021-08-31 MED ORDER — FENTANYL CITRATE PF 50 MCG/ML IJ SOSY
25.0000 ug | PREFILLED_SYRINGE | INTRAMUSCULAR | Status: DC | PRN
  Administered 2021-08-31: 25 ug via INTRAVENOUS

## 2021-08-31 MED ORDER — ETOMIDATE 2 MG/ML IV SOLN
20.0000 mg | Freq: Once | INTRAVENOUS | Status: DC
  Administered 2021-08-31: 20 mg via INTRAVENOUS
  Filled 2021-08-31: qty 10

## 2021-08-31 MED ORDER — SODIUM CHLORIDE 0.9 % IV SOLN: INTRAVENOUS | Status: DC | PRN

## 2021-08-31 MED ORDER — FENTANYL CITRATE PF 50 MCG/ML IJ SOSY
25.0000 ug | PREFILLED_SYRINGE | Freq: Once | INTRAMUSCULAR | Status: AC
  Administered 2021-08-31: 25 ug via INTRAVENOUS

## 2021-08-31 MED ORDER — MIDAZOLAM HCL 2 MG/2ML IJ SOLN
2.0000 mg | INTRAMUSCULAR | Status: DC | PRN
  Administered 2021-08-31: 2 mg via INTRAVENOUS

## 2021-08-31 MED ORDER — PROPOFOL 1000 MG/100ML IV EMUL
INTRAVENOUS | Status: AC
  Administered 2021-08-31: 5 ug/kg/min via INTRAVENOUS
  Filled 2021-08-31: qty 100

## 2021-08-31 MED ORDER — POTASSIUM CHLORIDE 10 MEQ/100ML IV SOLN
10.0000 meq | INTRAVENOUS | Status: AC
  Administered 2021-08-31 (×2): 10 meq via INTRAVENOUS
  Filled 2021-08-31 (×2): qty 100

## 2021-08-31 MED ORDER — ALBUTEROL SULFATE (2.5 MG/3ML) 0.083% IN NEBU: 2.5000 mg | INHALATION_SOLUTION | RESPIRATORY_TRACT | Status: DC | PRN

## 2021-08-31 MED ORDER — CHLORHEXIDINE GLUCONATE 0.12% ORAL RINSE (MEDLINE KIT)
15.0000 mL | Freq: Two times a day (BID) | OROMUCOSAL | Status: DC
  Administered 2021-08-31 – 2021-09-11 (×21): 15 mL via OROMUCOSAL

## 2021-08-31 MED ORDER — MIDAZOLAM 50MG/50ML (1MG/ML) PREMIX INFUSION
0.5000 mg/h | INTRAVENOUS | Status: DC
  Administered 2021-08-31: 0.5 mg/h via INTRAVENOUS
  Filled 2021-08-31: qty 50

## 2021-08-31 MED ORDER — HYDRALAZINE HCL 20 MG/ML IJ SOLN
10.0000 mg | Freq: Four times a day (QID) | INTRAMUSCULAR | Status: DC | PRN
  Administered 2021-08-31 – 2021-09-03 (×4): 10 mg via INTRAVENOUS
  Filled 2021-08-31 (×4): qty 1

## 2021-08-31 MED ORDER — MIDAZOLAM-SODIUM CHLORIDE 100-0.9 MG/100ML-% IV SOLN
0.5000 mg/h | INTRAVENOUS | Status: DC
  Filled 2021-08-31: qty 100

## 2021-08-31 MED ORDER — FUROSEMIDE 10 MG/ML IJ SOLN
20.0000 mg | Freq: Every day | INTRAMUSCULAR | Status: DC
  Administered 2021-08-31: 20 mg via INTRAVENOUS
  Filled 2021-08-31: qty 2

## 2021-08-31 MED ORDER — NYSTATIN 100000 UNIT/GM EX POWD
Freq: Three times a day (TID) | CUTANEOUS | Status: DC
  Administered 2021-09-09: 1 via TOPICAL
  Filled 2021-08-31 (×2): qty 15

## 2021-08-31 MED ORDER — POLYETHYLENE GLYCOL 3350 17 G PO PACK: 17.0000 g | PACK | Freq: Every day | ORAL | Status: DC

## 2021-08-31 MED ORDER — SODIUM CHLORIDE 0.9 % IV SOLN
1.0000 g | INTRAVENOUS | Status: DC
  Administered 2021-08-31: 1 g via INTRAVENOUS
  Filled 2021-08-31: qty 10

## 2021-08-31 MED ORDER — MIDAZOLAM HCL 2 MG/2ML IJ SOLN
1.0000 mg | INTRAMUSCULAR | Status: DC | PRN
  Administered 2021-09-01: 2 mg via INTRAVENOUS
  Filled 2021-08-31: qty 2

## 2021-08-31 MED ORDER — PROPOFOL 1000 MG/100ML IV EMUL
5.0000 ug/kg/min | INTRAVENOUS | Status: DC
  Administered 2021-08-31: 5 ug/kg/min via INTRAVENOUS
  Administered 2021-09-01: 10 ug/kg/min via INTRAVENOUS
  Administered 2021-09-01: 15 ug/kg/min via INTRAVENOUS
  Administered 2021-09-02 – 2021-09-03 (×7): 25 ug/kg/min via INTRAVENOUS
  Filled 2021-08-31 (×11): qty 100

## 2021-08-31 MED ORDER — POLYETHYLENE GLYCOL 3350 17 G PO PACK
17.0000 g | PACK | Freq: Every day | ORAL | Status: DC
Start: 2021-08-31 — End: 2021-09-04
  Administered 2021-09-01 – 2021-09-03 (×3): 17 g
  Filled 2021-08-31 (×3): qty 1

## 2021-08-31 NOTE — Consult Note (Signed)
NAME:  [name] MRN:  [id number] DOB:  [birth year] LOS: 0 ADMISSION DATE:  08/30/2021  CONSULTATION DATE:  08/31/2021 REFERRING MD:  Dr. Inda Merlin  REASON FOR CONSULTATION:  acute hypoxemic respiratory failure   Initial Pulmonary/Critical Care Consultation  Brief History   N/A  History of present illness   This 66 y.o. morbidly obese African American female nonsmoker and breast cancer survivor is seen in consultation at the request of Dr. Inda Merlin for recommendations on further evaluation and management of acute hypoxemic respiratory failure and alcohol withdrawal seizures.  The patient presented to [facility] Emergency Department on 08/31/2021 via EMS with complaints of witnessed seizure activity at home.  The patient's family apparently reported that the patient is an alcoholic who has recently decided to stop dirnking cold [place].  In the ER, the patient had witnessed episodes of seizure activity that were treated with benzodiazepines (Valium, Ativan), which successfully stopped the seizure activity but was associated with recurrent apneic spells with hypoxia, prompting endotracheal intubation.  Neurology was consulted from the ER and has advised continuing with treatment of seizure activity with benzodiazepines as needed but advised against loading with antiepileptic drugs for now.  REVIEW OF SYSTEMS This patient is critically ill and cannot provide additional history nor review of systems due to mental status/unconsciousness and endotracheally intubated.   Past Medical/Surgical/Social/Family History   Past Medical History:  Diagnosis Date   Alcohol dependence ([place])    Aortic atherosclerosis ([place])    Arthritis    Cancer ([place]) 11/2019   Left breast   Chronic heart failure (HCC)    Chronic kidney disease, stage 3 ([place])    COVID-19    Diverticulitis    Family history of ovarian cancer    Heart murmur    11/26/19 echo: Mild AS. AV mean gradient 12.0 mmHg; however,  LVOT gradient 8 mmHg with intracvitary gradient-significant AS is not suspected   Hypertension    Right nephrolithiasis    Thoracic aortic aneurysm    Tubular adenoma polyp of rectum     Past Surgical History:  Procedure Laterality Date   COLONOSCOPY WITH PROPOFOL N/A 02/24/2021   Procedure: COLONOSCOPY WITH PROPOFOL;  Surgeon: Yetta Flock, MD;  [facility];  Service: Gastroenterology;  Laterality: N/A;   MASTECTOMY WITH AXILLARY LYMPH NODE DISSECTION Left 12/19/2019   Procedure: LEFT MASTECTOMY WITH AXILLARY LYMPH NODE DISSECTION;  Surgeon: [name], MD;  Location: [place];  Service: General;  Laterality: Left;   MULTIPLE EXTRACTIONS WITH ALVEOLOPLASTY Bilateral 12/14/2019   Procedure: MULTIPLE EXTRACTION WITH ALVEOLOPLASTY;  Surgeon: [name], DDS;  Location: [place];  Service: Oral Surgery;  Laterality: Bilateral;   POLYPECTOMY  02/24/2021   Procedure: POLYPECTOMY;  Surgeon: Yetta Flock, MD;  Location[facility];  Service: Gastroenterology;;   Sol Passer PLACEMENT N/A 12/19/2019   Procedure: INSERTION PORT-A-CATH WITH ULTRASOUND GUIDANCE;  Surgeon: [name], MD;  Location: [place];  Service: General;  Laterality: N/A;   RADIAL HEAD ARTHROPLASTY Right 06/25/2019   Procedure: RIGHT RADIAL HEAD ARTHROPLASTY;  Surgeon: [name], MD;  Location: [place];  Service: Orthopedics;  Laterality: Right;   TUBAL LIGATION      Social History   Tobacco Use   Smoking status: Never   Smokeless tobacco: Never  Substance Use Topics   Alcohol use: Yes    Alcohol/week: 3.0 standard drinks    Types: 1 Cans of beer, 2 Shots of liquor per week    Comment: daily  Family History  Problem Relation Age of Onset   Hypertension Mother    Dementia Mother    Ovarian cancer Half-Sister 27   Diabetes Half-Sister    Stroke Half-Sister      Significant Hospital Events   1/2: presented to ER with alcohol withdrawal seizures, intubated in ER   Consults:   Neurology PCCM   Procedures:  1/2: endotracheal intubation in ER   Significant Diagnostic Tests:     Micro Data:   Results for orders placed or performed during the hospital encounter of 08/30/21  Resp Panel by RT-PCR (Flu A&B, Covid) Nasopharyngeal Swab     Status: None   Collection Time: 08/31/21 12:01 AM   Specimen: Nasopharyngeal Swab; Nasopharyngeal(NP) swabs in vial transport medium  Result Value Ref Range Status   SARS Coronavirus 2 by RT PCR NEGATIVE NEGATIVE Final    Comment: (NOTE) SARS-CoV-2 target nucleic acids are NOT DETECTED.  The SARS-CoV-2 RNA is generally detectable in upper respiratory specimens during the acute phase of infection. The lowest concentration of SARS-CoV-2 viral copies this assay can detect is 138 copies/mL. A negative result does not preclude SARS-Cov-2 infection and should not be used as the sole basis for treatment or other patient management decisions. A negative result may occur with  improper specimen collection/handling, submission of specimen other than nasopharyngeal swab, presence of viral mutation(s) within the areas targeted by this assay, and inadequate number of viral copies(<138 copies/mL). A negative result must be combined with clinical observations, patient history, and epidemiological information. The expected result is Negative.  Fact Sheet for Patients:  EntrepreneurPulse.com.au  Fact Sheet for Healthcare Providers:  IncredibleEmployment.be  This test is no t yet approved or cleared by the [place] FDA and  has been authorized for detection and/or diagnosis of SARS-CoV-2 by FDA under an Emergency Use Authorization (EUA). This EUA will remain  in effect (meaning this test can be used) for the duration of the COVID-19 declaration under Section 564(b)(1) of the Act, 21 U.S.C.section 360bbb-3(b)(1), unless the authorization is terminated  or revoked sooner.       Influenza A  by PCR NEGATIVE NEGATIVE Final   Influenza B by PCR NEGATIVE NEGATIVE Final    Comment: (NOTE) The Xpert Xpress SARS-CoV-2/FLU/RSV plus assay is intended as an aid in the diagnosis of influenza from Nasopharyngeal swab specimens and should not be used as a sole basis for treatment. Nasal washings and aspirates are unacceptable for Xpert Xpress SARS-CoV-2/FLU/RSV testing.  Fact Sheet for Patients: EntrepreneurPulse.com.au  Fact Sheet for Healthcare Providers: IncredibleEmployment.be  This test is not yet approved or cleared by the [place] FDA and has been authorized for detection and/or diagnosis of SARS-CoV-2 by FDA under an Emergency Use Authorization (EUA). This EUA will remain in effect (meaning this test can be used) for the duration of the COVID-19 declaration under Section 564(b)(1) of the Act, 21 U.S.C. section 360bbb-3(b)(1), unless the authorization is terminated or revoked.  Performed at [facility] [place] [address] [place], [place] [address]       Antimicrobials:    Interim history/subjective:  N/A   Objective   BP (!) 123/97    Pulse 81    Temp 98.5 F (36.9 C) (Oral)    Resp 19    Wt (!) 138 kg    SpO2 100%    BMI 57.48 kg/m     Filed Weights   08/31/21 0400  Weight: (!) 138 kg   No intake or output data  in the 24 hours ending 08/31/21 0445  Vent Mode: PRVC FiO2 (%):  [100 %] 100 % Set Rate:  [16 bmp] 16 bmp Vt Set:  [410 mL] 410 mL PEEP:  [5 cmH20] 5 cmH20 Plateau Pressure:  [20 cmH20] 20 cmH20   Examination: GENERAL: Intubated. Still under the effects of RSI medications.  Morbidly obese. No acute distress. HEAD: normocephalic, atraumatic EYE: Pupils are equal, nonreactive. No scleral icterus, no pallor. NOSE: nares are patent. No polyps. No exudate. THROAT/ORAL CAVITY: Normal dentition. No oral thrush. No exudate. Mucous membranes are moist. No tonsillar enlargement. ETT in situ.   NECK: supple, no thyromegaly, no JVD, no lymphadenopathy. Trachea midline. CHEST/LUNG: symmetric in development and expansion. Good air entry. Scattered rales and rhonchi. HEART: Regular S1 and S2 without murmur, rub or gallop. ABDOMEN: soft, nontender, nondistended. Normoactive bowel sounds. No rebound. No guarding. No hepatosplenomegaly. EXTREMITIES: Edema: 2+. No cyanosis. No clubbing. 2+ DP pulses LYMPHATIC: no cervical/axillary/inguinal lymph nodes appreciated MUSCULOSKELETAL: No point tenderness. No bulk atrophy. Joints: normal inspection.  SKIN:  No rash or lesion. NEUROLOGIC: Synchronous with ventilator.  Still under the effects of RSI medications.   Resolved Hospital Problem list      Assessment & Plan:   ASSESSMENT/PLAN:  ASSESSMENT (included in the Hospital Problem List)  Principal Problem:   Acute respiratory failure with hypoxia ([place]) Active Problems:   Alcohol withdrawal seizure with delirium (HCC)   Chronic kidney disease, stage 3a (HCC)   Chronic diastolic CHF (congestive heart failure) ([place])   Essential hypertension   Alcoholic hepatitis   Morbid obesity with BMI of 60.0-69.9, adult (HCC)   Thrombocytopenia ([place])   UTI (urinary tract infection)   By systems: PULMONARY Acute hypoxemic respiratory failure Endotracheal intubation Trach aspirate for Gram stain, C/S Titrate vent settings based on ABG results Sedation with propofol.  Titrate for RASS -2. Albuterol PRN   CARDIOVASCULAR Hypertension Furosemide 20 mg IV daily.   Start carvedilol 6.25 mg PO BID. Home antihypertensives include carvedilol 12.5 mg PO BID and furosemide 20 mg PO daily.   RENAL Chronic kidney disease, stage IIIA Monitor urine output Renal dosing of medications Avoid nephrotoxic drugs Place Foley catheter   GASTROINTESTINAL: No acute issues GI PROPHYLAXIS:  Protonix   HEMATOLOGIC/ONCOLOGIC History of breast cancer Thrombocytopenia, perhaps due to chronic alcoholic liver  disease Continue Arimidex DVT PROPHYLAXIS: heparin   INFECTIOUS UTI/bacteriuria and pyuria Empiric Rocephin  ENDOCRINE: No acute issues   NEUROLOGIC Alcohol withdrawal seizures Propofol for sedation on ventilator. Versed PRN for seizures EEG. Neurology help appreciated.   PLAN/RECOMMENDATIONS  Transfer to ICU for further evaluation and management of ACUTE HYPOXEMIC RESPIRATORY FAILURE and ALCOHOL WITHDRAWAL SEIZURES. See above.    My assessment, plan of care, findings, medications, side effects, etc. were discussed with: nurse.   Best practice:  Diet: tube feed Pain/Anxiety/Delirium protocol (if indicated): propofol VAP protocol (if indicated): YES DVT prophylaxis: heparin GI prophylaxis: Protonix Glucose control: N/A Mobility/Activity: bedrest   Code Status: Prior Family Communication:   no family at bedside Disposition: transfer to ICU   Labs   CBC: Recent Labs  Lab 08/31/21 0001  WBC 9.9  NEUTROABS 8.0*  HGB 11.9*  HCT 35.7*  MCV 97.0  PLT 132*    Basic Metabolic Panel: Recent Labs  Lab 08/31/21 0001  NA 138  K 3.1*  CL 101  CO2 21*  GLUCOSE 156*  BUN 16  CREATININE 1.51*  CALCIUM 8.8*   GFR: Estimated Creatinine Clearance: 48.5 mL/min (A) (by C-G formula  based on SCr of 1.51 mg/dL (H)). Recent Labs  Lab 08/31/21 0001  WBC 9.9    Liver Function Tests: Recent Labs  Lab 08/31/21 0001  AST 59*  ALT 35  ALKPHOS 60  BILITOT 1.6*  PROT 8.2*  ALBUMIN 3.8   No results for input(s): LIPASE, AMYLASE in the last 168 hours. Recent Labs  Lab 08/31/21 0001  AMMONIA 57*    ABG    Component Value Date/Time   PHART 7.366 08/31/2021 0148   PCO2ART 47.3 08/31/2021 0148   PO2ART 307 (H) 08/31/2021 0148   HCO3 26.5 08/31/2021 0148   TCO2 23 12/19/2019 1047   O2SAT 99.9 08/31/2021 0148     Coagulation Profile: No results for input(s): INR, PROTIME in the last 168 hours.  Cardiac Enzymes: No results for input(s): CKTOTAL, CKMB,  CKMBINDEX, TROPONINI in the last 168 hours.  HbA1C: Hgb A1c MFr Bld  Date/Time Value Ref Range Status  10/16/2020 03:47 PM 5.7 (H) 4.8 - 5.6 % Final    Comment:             Prediabetes: 5.7 - 6.4          Diabetes: >6.4          Glycemic control for adults with diabetes: <7.0   03/03/2020 10:18 AM 6.1 (H) 4.8 - 5.6 % Final    Comment:    (NOTE) Pre diabetes:          5.7%-6.4%  Diabetes:              >6.4%  Glycemic control for   <7.0% adults with diabetes     CBG: No results for input(s): GLUCAP in the last 168 hours.   Review of Systems:   See above   Past Medical History   Past Medical History:  Diagnosis Date   Alcohol dependence ([place])    Aortic atherosclerosis ([place])    Arthritis    Cancer ([place]) 11/2019   Left breast   Chronic heart failure (HCC)    Chronic kidney disease, stage 3 ([place])    COVID-19    Diverticulitis    Family history of ovarian cancer    Heart murmur    11/26/19 echo: Mild AS. AV mean gradient 12.0 mmHg; however, LVOT gradient 8 mmHg with intracvitary gradient-significant AS is not suspected   Hypertension    Right nephrolithiasis    Thoracic aortic aneurysm    Tubular adenoma polyp of rectum       Surgical History    Past Surgical History:  Procedure Laterality Date   COLONOSCOPY WITH PROPOFOL N/A 02/24/2021   Procedure: COLONOSCOPY WITH PROPOFOL;  Surgeon: Yetta Flock, MD;  [facility];  Service: Gastroenterology;  Laterality: N/A;   MASTECTOMY WITH AXILLARY LYMPH NODE DISSECTION Left 12/19/2019   Procedure: LEFT MASTECTOMY WITH AXILLARY LYMPH NODE DISSECTION;  Surgeon: [name], MD;  Location: [place];  Service: General;  Laterality: Left;   MULTIPLE EXTRACTIONS WITH ALVEOLOPLASTY Bilateral 12/14/2019   Procedure: MULTIPLE EXTRACTION WITH ALVEOLOPLASTY;  Surgeon: [name], DDS;  Location: [place];  Service: Oral Surgery;  Laterality: Bilateral;   POLYPECTOMY  02/24/2021   Procedure: POLYPECTOMY;   Surgeon: Yetta Flock, MD;  [facility];  Service: Gastroenterology;;   Sol Passer PLACEMENT N/A 12/19/2019   Procedure: INSERTION PORT-A-CATH WITH ULTRASOUND GUIDANCE;  Surgeon: [name], MD;  Location: [place];  Service: General;  Laterality: N/A;   RADIAL HEAD ARTHROPLASTY Right 06/25/2019   Procedure: RIGHT RADIAL HEAD ARTHROPLASTY;  Surgeon: [name], MD;  Location: [place];  Service: Orthopedics;  Laterality: Right;   TUBAL LIGATION        Social History   Social History   Socioeconomic History   Marital status: Legally Separated    Spouse name: Not on file   Number of children: Not on file   Years of education: Not on file   Highest education level: Not on file  Occupational History   Not on file  Tobacco Use   Smoking status: Never   Smokeless tobacco: Never  Vaping Use   Vaping Use: Never used  Substance and Sexual Activity   Alcohol use: Yes    Alcohol/week: 3.0 standard drinks    Types: 1 Cans of beer, 2 Shots of liquor per week    Comment: daily   Drug use: Yes    Types: Marijuana   Sexual activity: Not Currently  Other Topics Concern   Not on file  Social History Narrative   Grand daughter stays with her   Mrs Borak cares for her 48 developmental delayed daughter with mental health issues at home - Previous was at a group home   Social Determinants of Health   Financial Resource Strain: Not on file  Food Insecurity: Food Insecurity Present   Worried About Charity fundraiser in the Last Year: Sometimes true   Arboriculturist in the Last Year: Sometimes true  Transportation Needs: No Transportation Needs   Lack of Transportation (Medical): No   Lack of Transportation (Non-Medical): No  Physical Activity: Not on file  Stress: No Stress Concern Present   Feeling of Stress : Only a little  Social Connections: Moderately Integrated   Frequency of Communication with Friends and Family: Twice a week   Frequency of Social  Gatherings with Friends and Family: Twice a week   Attends Religious Services: 1 to 4 times per year   Active Member of [employer] or Organizations: Yes   Attends Archivist Meetings: 1 to 4 times per year   Marital Status: Separated      Family History    Family History  Problem Relation Age of Onset   Hypertension Mother    Dementia Mother    Ovarian cancer Half-Sister 37   Diabetes Half-Sister    Stroke Half-Sister    family history includes Dementia in her mother; Diabetes in her half-sister; Hypertension in her mother; Ovarian cancer (age of onset: 7) in her half-sister; Stroke in her half-sister.    Allergies Allergies  Allergen Reactions   Amlodipine Swelling    BLE edema      Current Medications  Current Facility-Administered Medications:    betamethasone acetate-betamethasone sodium phosphate (CELESTONE) injection 3 mg, 3 mg, Intra-articular, Once, Evans, Brent M, DPM   docusate (COLACE) 50 MG/5ML liquid 100 mg, 100 mg, Per Tube, BID, Shalhoub, Sherryll Burger, MD   etomidate (AMIDATE) injection 20 mg, 20 mg, Intravenous, Once, Molpus, John, MD   fentaNYL (SUBLIMAZE) injection 25 mcg, 25 mcg, Intravenous, Q15 min PRN, Shalhoub, Sherryll Burger, MD   fentaNYL (SUBLIMAZE) injection 25-100 mcg, 25-100 mcg, Intravenous, Q30 min PRN, Shalhoub, Sherryll Burger, MD   midazolam (VERSED) 100 mg/100 mL (1 mg/mL) premix infusion, 0.5-10 mg/hr, Intravenous, Continuous, Shalhoub, Sherryll Burger, MD   polyethylene glycol (MIRALAX / GLYCOLAX) packet 17 g, 17 g, Per Tube, Daily, Shalhoub, Sherryll Burger, MD   potassium chloride 10 mEq in 100 mL IVPB, 10 mEq, Intravenous, Q1 Hr x 4, Shalhoub, Iona Beard  J, MD   propofol (DIPRIVAN) 1000 MG/100ML infusion, 5-80 mcg/kg/min, Intravenous, Continuous, Molpus, John, MD, Last Rate: 4.14 mL/hr at 08/31/21 0422, 5 mcg/kg/min at 08/31/21 0422   sterile water (preservative free) injection, , , ,    succinylcholine (ANECTINE) syringe 140 mg, 140 mg, Intravenous, Once, Molpus,  John, MD   vecuronium (NORCURON) 10 MG injection, , , ,   Current Outpatient Medications:    anastrozole (ARIMIDEX) 1 MG tablet, Take 1 tablet (1 mg total) by mouth daily., Disp: 90 tablet, Rfl: 4   ascorbic acid (VITAMIN C) 500 MG tablet, Take 1 tablet (500 mg total) by mouth daily. (Patient taking differently: Take 1,000 mg by mouth daily.), Disp: 30 tablet, Rfl: 0   aspirin 81 MG EC tablet, Take 1 tablet (81 mg total) by mouth daily. Swallow whole., Disp: 90 tablet, Rfl: 3   Biotin 10000 MCG TABS, Take 10,000 mcg by mouth daily., Disp: , Rfl:    carvedilol (COREG) 12.5 MG tablet, TAKE 1 TABLET BY MOUTH TWICE DAILY WITH MEALS, Disp: 60 tablet, Rfl: 3   folic acid (FOLVITE) 1 MG tablet, Take 1 tablet (1 mg total) by mouth daily. (Patient not taking: Reported on 02/19/2021), Disp: 30 tablet, Rfl: 0   furosemide (LASIX) 20 MG tablet, Take 1 tablet (20 mg total) by mouth daily. Please make overdue appt with Dr. Gasper Sells before anymore refills. Thank you 1st attempt, Disp: 30 tablet, Rfl: 0   gabapentin (NEURONTIN) 100 MG capsule, Take 1 capsule (100 mg total) by mouth 2 (two) times daily., Disp: 180 capsule, Rfl: 0   gabapentin (NEURONTIN) 300 MG capsule, Take 1 capsule (300 mg total) by mouth at bedtime., Disp: 90 capsule, Rfl: 6   lidocaine (XYLOCAINE) 5 % ointment, Apply 1 application topically as needed., Disp: 35.44 g, Rfl: 0   lidocaine-prilocaine (EMLA) cream, Apply 1 application topically as needed., Disp: 30 g, Rfl: 0   Magnesium Oxide 400 MG CAPS, Take 1 capsule (400 mg total) by mouth daily., Disp: 90 capsule, Rfl: 3   misoprostol (CYTOTEC) 200 MCG tablet, Take 2 tablets (400 mcg total) by mouth once for 1 dose. Take two pills one time the night before your gynecological procedure, Disp: 2 tablet, Rfl: 0   Multiple Vitamin (MULTIVITAMIN WITH MINERALS) TABS tablet, Take 1 tablet by mouth daily., Disp: , Rfl:    nystatin (MYCOSTATIN/NYSTOP) powder, Apply topically 3 (three) times daily.  Apply to under panus. (Patient taking differently: Apply 1 application topically daily as needed (Apply to under panus.).), Disp: 60 g, Rfl: 0   traMADol (ULTRAM) 50 MG tablet, Take 50 mg by mouth every 8 (eight) hours as needed., Disp: , Rfl:   Home Medications  Prior to Admission medications   Medication Sig Start Date End Date Taking? Authorizing Provider  anastrozole (ARIMIDEX) 1 MG tablet Take 1 tablet (1 mg total) by mouth daily. 12/18/20   Magrinat, Virgie Dad, MD  ascorbic acid (VITAMIN C) 500 MG tablet Take 1 tablet (500 mg total) by mouth daily. Patient taking differently: Take 1,000 mg by mouth daily. 08/03/20   [name], MD  aspirin 81 MG EC tablet Take 1 tablet (81 mg total) by mouth daily. Swallow whole. 02/24/21   Magrinat, Virgie Dad, MD  Biotin 10000 MCG TABS Take 10,000 mcg by mouth daily.    [provider]  carvedilol (COREG) 12.5 MG tablet TAKE 1 TABLET BY MOUTH TWICE DAILY WITH MEALS 04/08/21   Ladell Pier, MD  folic acid (FOLVITE) 1  MG tablet Take 1 tablet (1 mg total) by mouth daily. Patient not taking: Reported on 02/19/2021 08/03/20   [name], MD  furosemide (LASIX) 20 MG tablet Take 1 tablet (20 mg total) by mouth daily. Please make overdue appt with Dr. Gasper Sells before anymore refills. Thank you 1st attempt 06/03/21   [name], MD  gabapentin (NEURONTIN) 100 MG capsule Take 1 capsule (100 mg total) by mouth 2 (two) times daily. 02/24/21   Magrinat, Virgie Dad, MD  gabapentin (NEURONTIN) 300 MG capsule Take 1 capsule (300 mg total) by mouth at bedtime. 07/14/20   Magrinat, Virgie Dad, MD  lidocaine (XYLOCAINE) 5 % ointment Apply 1 application topically as needed. 06/10/21   Sherrell Puller, PA-C  lidocaine-prilocaine (EMLA) cream Apply 1 application topically as needed. 02/05/20   Magrinat, Virgie Dad, MD  Magnesium Oxide 400 MG CAPS Take 1 capsule (400 mg total) by mouth daily. 06/30/20   Chandrasekhar, Terisa Starr, MD  misoprostol (CYTOTEC)  200 MCG tablet Take 2 tablets (400 mcg total) by mouth once for 1 dose. Take two pills one time the night before your gynecological procedure 02/23/21 02/23/21  Clarnce Flock, MD  Multiple Vitamin (MULTIVITAMIN WITH MINERALS) TABS tablet Take 1 tablet by mouth daily. 06/13/20   Hongalgi, Lenis Dickinson, MD  nystatin (MYCOSTATIN/NYSTOP) powder Apply topically 3 (three) times daily. Apply to under panus. Patient taking differently: Apply 1 application topically daily as needed (Apply to under panus.). 06/12/20   Hongalgi, Lenis Dickinson, MD  traMADol (ULTRAM) 50 MG tablet Take 50 mg by mouth every 8 (eight) hours as needed. 07/27/21   [provider]      Critical care time: 30 minutes.  The treatment and management of the patient's condition was required based on the threat of imminent deterioration. This time reflects time spent by the physician evaluating, providing care and managing the critically ill patient's care. The time was spent at the immediate bedside (or on the same floor/unit and dedicated to this patient's care). Time involved in separately billable procedures is NOT included int he critical care time indicated above. Family meeting and update time may be included above if and only if the patient is unable/incompetent to participate in clinical interview and/or decision making, and the discussion was necessary to determining treatment decisions.    Renee Pain, MD Board Certified by the ABIM, [place]

## 2021-08-31 NOTE — Assessment & Plan Note (Addendum)
·   Patient initially presented to the emergency department with extreme agitation and delirium followed by 2 episodes so far of seizure-like activity as witnessed by nursing staff  After each episode of seizure-like activity patient exhibited a period of profound respiratory depression requiring a period of bag valve ventilation followed by placing the patient on a nonrebreather mask  Patient was given a dose of Valium during the first seizure followed by a dose of Ativan during the second seizure.  Case discussed with Dr. Cheral Marker with neurology who agrees considering the temporal nature of the patient's seizure activity 3 days after completely ceasing alcohol use these are likely alcohol withdrawal seizures.  He recommends either scheduled or as needed benzodiazepines or both with CIWA protocol going forward.  He recommends ICU admission  After discussions with Dr. Carson Myrtle with PCCM and Dr. Florina Ou with the ED we have decided to proceed with intubating the patient as patient's airway is currently at high risk due to ongoing need for benzodiazepines and recurrent bouts of seizure in a patient that is morbidly obese with a difficult airway.

## 2021-08-31 NOTE — Progress Notes (Signed)
Assisted with transporting PT from [facility] ED to [facility] ICU while on ventilator (100% Fi02)- uneventful. RN remains at bedside. ICu RT aware PT has arrived.

## 2021-08-31 NOTE — ED Notes (Signed)
MD at bedside. Had to use bag valve mask to ventilate for about 1.5 minutes. Pt now saturating at 96% on 10L.

## 2021-08-31 NOTE — Anesthesia Procedure Notes (Addendum)
Arterial Line Insertion Start/End1/09/2021 11:13 PM, 08/31/2021 10:45 AM Performed by: Milford Cage, CRNA, CRNA  Patient location: Nursing unit. Preanesthetic checklist: patient identified, IV checked, site marked, risks and benefits discussed, surgical consent, monitors and equipment checked, pre-op evaluation and timeout performed Right, radial was placed Catheter size: 20 G Hand hygiene performed , maximum sterile barriers used  and Seldinger technique used Allen's test indicative of satisfactory collateral circulation Attempts: 3 Procedure performed using ultrasound guided technique. Ultrasound Notes:anatomy identified, needle tip was noted to be adjacent to the nerve/plexus identified and no ultrasound evidence of intravascular and/or intraneural injection Following insertion, Biopatch and dressing applied. Post procedure assessment: normal and unchanged  Patient tolerated the procedure well with no immediate complications.

## 2021-08-31 NOTE — Progress Notes (Signed)
eLink Physician-Brief Progress Note Patient Name: [name] DOB: 04-10-1956 MRN: [id number]   Date of Service  08/31/2021  HPI/Events of Note  Received request for restraints Patient seen intubated and a risk for self harm by pulling lines and tubes   eICU Interventions  Bilateral soft wrist restraints ordered Bedside team to assess in am if restraints to be continued      Intervention Category Minor Interventions: Agitation / anxiety - evaluation and management  Judd Lien 08/31/2021, 9:04 PM

## 2021-08-31 NOTE — Progress Notes (Signed)
Peripherally Inserted Central Catheter Placement  The IV Nurse has discussed with the patient and/or persons authorized to consent for the patient, the purpose of this procedure and the potential benefits and risks involved with this procedure.  The benefits include less needle sticks, lab draws from the catheter, and the patient may be discharged home with the catheter. Risks include, but not limited to, infection, bleeding, blood clot (thrombus formation), and puncture of an artery; nerve damage and irregular heartbeat and possibility to perform a PICC exchange if needed/ordered by physician.  Alternatives to this procedure were also discussed.  Bard Power PICC patient education guide, fact sheet on infection prevention and patient information card has been provided to patient /or left at bedside.    PICC Placement Documentation  PICC Double Lumen 08/31/21 PICC Right Brachial 40 cm 0 cm (Active)  Indication for Insertion or Continuance of Line Vasoactive infusions 08/31/21 1210  Exposed Catheter (cm) 0 cm 08/31/21 1210  Site Assessment Clean;Dry;Intact 08/31/21 1210  Lumen #1 Status Flushed;Saline locked;Blood return noted 08/31/21 1210  Lumen #2 Status Flushed;Saline locked;Blood return noted 08/31/21 1210  Dressing Type Transparent;Securing device 08/31/21 1210  Dressing Status Clean;Dry;Intact 08/31/21 1210  Antimicrobial disc in place? Yes 08/31/21 1210  Safety Lock Not Applicable 87/86/76 7209  Dressing Change Due 09/07/21 08/31/21 1210       Shon Hale 08/31/2021, 12:13 PM

## 2021-08-31 NOTE — ED Notes (Signed)
Pt. Soiled bed with urine.pt. cleaned up with peri-cleanse on washcloth and dryed. Pt. New linen and chucks applied with 3 assist.

## 2021-08-31 NOTE — H&P (Signed)
History and Physical    [name] GHW:[id number] DOB: 01-May-1956 DOA: 08/30/2021  PCP: [name], MD  Patient coming from: Home via EMS   Chief Complaint:  Chief Complaint  Patient presents with   Altered Mental Status     HPI:    65 year old female with past medical history of hypertension, alcohol abuse, stage IIIA invasive ductal carcinoma of the left breast (Dx 10/2019 S/P radiacal mastectomy 11/2019 with adjuctive chemoradiation complete 04/2020), chronic kidney disease stage IIIa (baseline Cr 7.8-9.3), diastolic congestive heart failure  (Echo 11/2020 EF 60-65% with G2DD), anemia of chronic disease who presents to [facility] emergency department via EMS after patient was witnessed to have seizure-like activity.  Patient is minimally responsive this point and is unable to provide history.  Majority the history is been obtained from the emergency department staff as well as review of triage and EMS documentation.  EMS was originally called to the scene due to witnessed seizure-like activity by the patient's family.  When EMS arrived on scene patient was extremely agitated and confused.  According EMS documentation, family additionally was quite hysterical in response to the patient's behavior and therefore was unable to provide any significant history and had to be escorted away from the scene.  Patient was combative and uncooperative throughout the EMS evaluation.  5 mg of intravenous Versed was administered.  At 1 point family was able to provide a history the patient suffers from alcohol abuse and recently was on an alcohol binge and suddenly stopped approximately 3 days ago.  Patient was yelling out that "oh God it hurts" during the EMS evaluation but there is no further detail provided.  Upon evaluation in the emergency department patient exhibited 2 separate 10-second episodes of tonic-clonic seizure activity followed by substantial hypoxia requiring intermittent bag  valve ventilation and transitioning to a nonrebreather mask.  Patient was administered 10 mg of intravenous Versed after the first seizure activity and to milligrams of intravenous Ativan after the second.  Patient has been lethargic and minimally responsive in between.  Hospitalist group has now been called to assess the patient for admission to the hospital.   Review of Systems:   Review of Systems  Unable to perform ROS: Mental acuity   Past Medical History:  Diagnosis Date   Alcohol dependence ([place])    Aortic atherosclerosis ([place])    Arthritis    Cancer ([place]) 11/2019   Left breast   Chronic heart failure ([place])    Chronic kidney disease, stage 3 ([place])    COVID-19    Diverticulitis    Family history of ovarian cancer    Heart murmur    11/26/19 echo: Mild AS. AV mean gradient 12.0 mmHg; however, LVOT gradient 8 mmHg with intracvitary gradient-significant AS is not suspected   Hypertension    Right nephrolithiasis    Thoracic aortic aneurysm    Tubular adenoma polyp of rectum     Past Surgical History:  Procedure Laterality Date   COLONOSCOPY WITH PROPOFOL N/A 02/24/2021   Procedure: COLONOSCOPY WITH PROPOFOL;  Surgeon: Yetta Flock, MD;  Location: [facility];  Service: Gastroenterology;  Laterality: N/A;   MASTECTOMY WITH AXILLARY LYMPH NODE DISSECTION Left 12/19/2019   Procedure: LEFT MASTECTOMY WITH AXILLARY LYMPH NODE DISSECTION;  Surgeon: [name], MD;  Location: [place];  Service: General;  Laterality: Left;   MULTIPLE EXTRACTIONS WITH ALVEOLOPLASTY Bilateral 12/14/2019   Procedure: MULTIPLE EXTRACTION WITH ALVEOLOPLASTY;  Surgeon: [name], DDS;  Location: [place];  Service: Oral Surgery;  Laterality: Bilateral;   POLYPECTOMY  02/24/2021   Procedure: POLYPECTOMY;  Surgeon: Yetta Flock, MD;  Location: [facility];  Service: Gastroenterology;;   Sol Passer PLACEMENT N/A 12/19/2019   Procedure: INSERTION PORT-A-CATH WITH ULTRASOUND GUIDANCE;   Surgeon: [name], MD;  Location: [place];  Service: General;  Laterality: N/A;   RADIAL HEAD ARTHROPLASTY Right 06/25/2019   Procedure: RIGHT RADIAL HEAD ARTHROPLASTY;  Surgeon: [name], MD;  Location: [place];  Service: Orthopedics;  Laterality: Right;   TUBAL LIGATION       reports that she has never smoked. She has never used smokeless tobacco. She reports current alcohol use of about 3.0 standard drinks per week. She reports current drug use. Drug: Marijuana.  Allergies  Allergen Reactions   Amlodipine Swelling    BLE edema    Family History  Problem Relation Age of Onset   Hypertension Mother    Dementia Mother    Ovarian cancer Half-Sister 101   Diabetes Half-Sister    Stroke Half-Sister      Prior to Admission medications   Medication Sig Start Date End Date Taking? Authorizing Provider  anastrozole (ARIMIDEX) 1 MG tablet Take 1 tablet (1 mg total) by mouth daily. 12/18/20   Magrinat, Virgie Dad, MD  ascorbic acid (VITAMIN C) 500 MG tablet Take 1 tablet (500 mg total) by mouth daily. Patient taking differently: Take 1,000 mg by mouth daily. 08/03/20   [name], MD  aspirin 81 MG EC tablet Take 1 tablet (81 mg total) by mouth daily. Swallow whole. 02/24/21   Magrinat, Virgie Dad, MD  Biotin 10000 MCG TABS Take 10,000 mcg by mouth daily.    [provider]  carvedilol (COREG) 12.5 MG tablet TAKE 1 TABLET BY MOUTH TWICE DAILY WITH MEALS 04/08/21   Ladell Pier, MD  folic acid (FOLVITE) 1 MG tablet Take 1 tablet (1 mg total) by mouth daily. Patient not taking: Reported on 02/19/2021 08/03/20   [name], MD  furosemide (LASIX) 20 MG tablet Take 1 tablet (20 mg total) by mouth daily. Please make overdue appt with Dr. Gasper Sells before anymore refills. Thank you 1st attempt 06/03/21   [name], MD  gabapentin (NEURONTIN) 100 MG capsule Take 1 capsule (100 mg total) by mouth 2 (two) times daily. 02/24/21   Magrinat, Virgie Dad, MD   gabapentin (NEURONTIN) 300 MG capsule Take 1 capsule (300 mg total) by mouth at bedtime. 07/14/20   Magrinat, Virgie Dad, MD  lidocaine (XYLOCAINE) 5 % ointment Apply 1 application topically as needed. 06/10/21   Sherrell Puller, PA-C  lidocaine-prilocaine (EMLA) cream Apply 1 application topically as needed. 02/05/20   Magrinat, Virgie Dad, MD  Magnesium Oxide 400 MG CAPS Take 1 capsule (400 mg total) by mouth daily. 06/30/20   Chandrasekhar, Terisa Starr, MD  misoprostol (CYTOTEC) 200 MCG tablet Take 2 tablets (400 mcg total) by mouth once for 1 dose. Take two pills one time the night before your gynecological procedure 02/23/21 02/23/21  Clarnce Flock, MD  Multiple Vitamin (MULTIVITAMIN WITH MINERALS) TABS tablet Take 1 tablet by mouth daily. 06/13/20   Hongalgi, Lenis Dickinson, MD  nystatin (MYCOSTATIN/NYSTOP) powder Apply topically 3 (three) times daily. Apply to under panus. Patient taking differently: Apply 1 application topically daily as needed (Apply to under panus.). 06/12/20   Hongalgi, Lenis Dickinson, MD  traMADol (ULTRAM) 50 MG tablet Take 50 mg by mouth every 8 (eight) hours as needed. 07/27/21  [provider]    Physical Exam: Vitals:   08/30/21 2252 08/31/21 0039 08/31/21 0045 08/31/21 0122  BP: 130/81 (!) 147/72 (!) 158/59 (!) 123/97  Pulse: 95 74 84 81  Resp: 18 (!) 28 (!) 31 19  Temp: 98.5 F (36.9 C)     TempSrc: Oral     SpO2: 100% 96% 100% 100%    Constitutional: Minimally responsive, not in any acute distress.  Patient is obese. Skin: no rashes, no lesions, good skin turgor noted. Eyes: Pupils are currently pinpoint and minimally responsive to light.  No evidence of scleral icterus or conjunctival pallor.  ENMT: Moist mucous membranes noted.  Posterior pharynx clear of any exudate or lesions.   Neck: normal, supple, no masses, no thyromegaly.  No evidence of jugular venous distension.   Respiratory: clear to auscultation bilaterally, no wheezing, no crackles. Normal  respiratory effort. No accessory muscle use.  Cardiovascular: Regular rate and rhythm, no murmurs / rubs / gallops. No extremity edema. 2+ pedal pulses. No carotid bruits.  Chest:   Nontender without crepitus or deformity.   Back:   Nontender without crepitus or deformity. Abdomen: Abdomen is protuberant but soft and nontender.  No evidence of intra-abdominal masses.  Positive bowel sounds noted in all quadrants.   Musculoskeletal: No joint deformity upper and lower extremities. Good ROM, no contractures. Normal muscle tone.  Neurologic: Patient is profoundly somnolent and minimally responsive to verbal or painful stimuli.  Patient is not currently following commands.   Psychiatric: Unable to assess due to profound somnolence.  Patient currently does not seem to possess insight as to her current situation.  Labs on Admission: I have personally reviewed following labs and imaging studies -   CBC: Recent Labs  Lab 08/31/21 0001  WBC 9.9  NEUTROABS 8.0*  HGB 11.9*  HCT 35.7*  MCV 97.0  PLT 213*   Basic Metabolic Panel: Recent Labs  Lab 08/31/21 0001  NA 138  K 3.1*  CL 101  CO2 21*  GLUCOSE 156*  BUN 16  CREATININE 1.51*  CALCIUM 8.8*   GFR: CrCl cannot be calculated (Unknown ideal weight.). Liver Function Tests: Recent Labs  Lab 08/31/21 0001  AST 59*  ALT 35  ALKPHOS 60  BILITOT 1.6*  PROT 8.2*  ALBUMIN 3.8   No results for input(s): LIPASE, AMYLASE in the last 168 hours. Recent Labs  Lab 08/31/21 0001  AMMONIA 57*   Coagulation Profile: No results for input(s): INR, PROTIME in the last 168 hours. Cardiac Enzymes: No results for input(s): CKTOTAL, CKMB, CKMBINDEX, TROPONINI in the last 168 hours. BNP (last 3 results) No results for input(s): PROBNP in the last 8760 hours. HbA1C: No results for input(s): HGBA1C in the last 72 hours. CBG: No results for input(s): GLUCAP in the last 168 hours. Lipid Profile: No results for input(s): CHOL, HDL, LDLCALC,  TRIG, CHOLHDL, LDLDIRECT in the last 72 hours. Thyroid Function Tests: No results for input(s): TSH, T4TOTAL, FREET4, T3FREE, THYROIDAB in the last 72 hours. Anemia Panel: No results for input(s): VITAMINB12, FOLATE, FERRITIN, TIBC, IRON, RETICCTPCT in the last 72 hours. Urine analysis:    Component Value Date/Time   COLORURINE YELLOW 08/31/2021 0044   APPEARANCEUR HAZY (A) 08/31/2021 0044   LABSPEC 1.009 08/31/2021 0044   PHURINE 6.0 08/31/2021 0044   GLUCOSEU NEGATIVE 08/31/2021 0044   HGBUR SMALL (A) 08/31/2021 0044   BILIRUBINUR NEGATIVE 08/31/2021 [place] 08/31/2021 0044   PROTEINUR 100 (A) 08/31/2021 0044  UROBILINOGEN 1.0 07/14/2012 1630   NITRITE POSITIVE (A) 08/31/2021 0044   LEUKOCYTESUR MODERATE (A) 08/31/2021 0044    Radiological Exams on Admission - Personally Reviewed: CT Head Wo Contrast  Result Date: 08/31/2021 CLINICAL DATA:  Seizure-like activity. EXAM: CT HEAD WITHOUT CONTRAST TECHNIQUE: Contiguous axial images were obtained from the base of the skull through the vertex without intravenous contrast. COMPARISON:  June 10, 2021 FINDINGS: Brain: No evidence of acute infarction, hemorrhage, hydrocephalus, extra-axial collection or mass lesion/mass effect. Vascular: No hyperdense vessel or unexpected calcification. Skull: Normal. Negative for fracture or focal lesion. Sinuses/Orbits: There is a 2.4 cm x 1.5 cm left maxillary sinus polyp versus mucous retention cyst. Other: None. IMPRESSION: 1. No acute intracranial pathology. 2. Left maxillary sinus polyp versus mucous retention cyst. Electronically Signed   By: Virgina Norfolk M.D.   On: 08/31/2021 00:41    EKG: Personally reviewed.  Rhythm is normal sinus rhythm with heart rate of 87 bpm.  No dynamic ST segment changes appreciated.  Assessment/Plan  * Alcohol withdrawal seizure with delirium [facility]) Patient initially presented to the emergency department with extreme agitation and delirium followed  by 2 episodes so far of seizure-like activity as witnessed by nursing staff After each episode of seizure-like activity patient exhibited a period of profound respiratory depression requiring a period of bag valve ventilation followed by placing the patient on a nonrebreather mask Patient was given a dose of Valium during the first seizure followed by a dose of Ativan during the second seizure. Case discussed with Dr. Cheral Marker with neurology who agrees considering the temporal nature of the patient's seizure activity 3 days after completely ceasing alcohol use these are likely alcohol withdrawal seizures.  He recommends either scheduled or as needed benzodiazepines or both with CIWA protocol going forward.  He recommends ICU admission After discussions with Dr. Carson Myrtle with PCCM and Dr. Florina Ou with the ED we have decided to proceed with intubating the patient as patient's airway is currently at high risk due to ongoing need for benzodiazepines and recurrent bouts of seizure in a patient that is morbidly obese with a difficult airway.  Acute respiratory failure with hypoxia ([place])- (present on admission) Patient exhibiting bouts of severe acute hypoxia after each episode of seizure activity.  Unclear as to whether severe hypoxia is due to the seizure itself or due to the seizure terminating benzodiazepine that was administered. Patient had to be bagged valve ventilated temporarily after each episode making patient's airway extremely high risk As mentioned above we will proceed with intubation and placed on Versed for sedation.  Please see remainder of assessment and plan above.  Essential hypertension- (present on admission) As needed intravenous antihypertensives for now for markedly elevated blood pressure  Chronic kidney disease, stage 3a ([place])- (present on admission) Strict intake and output monitoring Creatinine currently 1.5, close to baseline that ranges between 1.3 and 1.5. Minimizing nephrotoxic  agents as much as possible Serial chemistries to monitor renal function and electrolytes   Chronic diastolic CHF (congestive heart failure) ([place])- (present on admission) No clinical evidence of cardiogenic volume overload Continue to monitor closely for signs of volume overload       Code Status:  Full code  code status decision has been confirmed with: Son Family Communication: Discussed plan of care with son via phone conversation  Status is: Inpatient  Remains inpatient appropriate because: Life-threatening recurrent alcohol withdrawal seizures with associated hypoxic respiratory failure requiring intubation, mechanical ventilation and coordination with multiple subspecialist.  CRITICAL CARE  ATTESTATION  Patient is at significant risk of morbidity and mortality secondary to suspected recurrent alcohol withdrawal seizures and bouts of severe hypoxic respiratory failure requiring high amounts of oxygen and intubation with mechanical ventilation as well as administration of multiple doses of benzodiazepines and other sedating agents.  Discussion was made with neurology and critical care as well as emergency medicine as well as updating family.  Critical care time spent: 69 minutes        Vernelle Emerald MD [facility] Pager [phone]  If 7PM-7AM, please contact night-coverage [email or web address] Use universal [place] password for that web site. If you do not have the password, please call the hospital operator.  08/31/2021, 4:21 AM

## 2021-08-31 NOTE — Assessment & Plan Note (Signed)
·   As needed intravenous antihypertensives for now for markedly elevated blood pressure

## 2021-08-31 NOTE — Progress Notes (Signed)
Anesthesia requested to bedside to assist with arterial line placement after attempts by staff. Patient intubated and sedated. Right radial arterial line established with ultrasound guidance. Patient tolerated well.

## 2021-08-31 NOTE — Assessment & Plan Note (Signed)
•   No clinical evidence of cardiogenic volume overload  Continue to monitor closely for signs of volume overload

## 2021-08-31 NOTE — Progress Notes (Signed)
Pharmacy Antibiotic Note  [name] is a 66 y.o. female admitted on 08/30/2021 with alcohol withdrawal seizures and concern for aspiration pneumonia.  Pharmacy has been consulted for Unasyn dosing.  Plan: Unasyn 3g IV q6h Follow up renal function, culture results, and clinical course.   Height: 5\' 1"  (154.9 cm) Weight: (!) 138 kg (304 lb 3.8 oz) IBW/kg (Calculated) : 47.8  Temp (24hrs), Avg:100.4 F (38 C), Min:98.5 F (36.9 C), Max:100.8 F (38.2 C)  Recent Labs  Lab 08/31/21 0001  WBC 9.9  CREATININE 1.51*    Estimated Creatinine Clearance: 48.5 mL/min (A) (by C-G formula based on SCr of 1.51 mg/dL (H)).    Allergies  Allergen Reactions   Amlodipine Swelling    BLE edema    Antimicrobials this admission: 1/2 Ceftriaxone >>  1/2 Unasyn >>   Dose adjustments this admission:   Microbiology results: 1/2 Covid neg; influenza neg 1/2 UCx:   1/2 Sputum:   1/2 MRSA PCR: not detected  Thank you for allowing pharmacy to be a part of this patients care.  Gretta Arab PharmD, BCPS Clinical Pharmacist [facility] main pharmacy [phone] 08/31/2021 10:21 AM

## 2021-08-31 NOTE — Progress Notes (Signed)
Initial Nutrition Assessment  DOCUMENTATION CODES:   Morbid obesity  INTERVENTION:  - when TF initiation feasible, recommend: Vital AF 1.2 @ 50 ml/hr with 45 ml Prosource TF. - this regimen will provide 1560 kcal, 123 grams protein, and 973 ml free water.    NUTRITION DIAGNOSIS:   Inadequate oral intake related to inability to eat as evidenced by NPO status.  GOAL:   Provide needs based on ASPEN/SCCM guidelines  MONITOR:   Vent status, Labs, Weight trends, I & O's  REASON FOR ASSESSMENT:   Ventilator  ASSESSMENT:   66 year old female with medical history of HTN, alcohol abuse, stage 3 invasive ductal carcinoma of the L breast (dx 10/2019 s/p radiacal mastectomy 11/2019 with adjuctive chemoradiation complete 04/2020), stage 3 CKD, CHF, and anemia of chronic disease. She presented to the ED via EMS after witnessed seizure-like activity. Family reported to EMS staff that patient has alcohol abuse and was recently on an alcohol binge but abruptly stopped drinking 3 days prior. In the ED she had 2 separate 10-second episodes of tonic-clonic seizure activity.  She was intubated today at ~0420 and OGT placed shortly after (gastric on CXR this AM). OGT was clamped at the time of RD visit. No visitors present at the time of RD visit.  Able to talk with RN who shares that OGT to be hooked to LIS d/t emesis when OGT placed and concern for aspiration.   She has not been seen by a [place] RD since 05/2020.  Weight today is 304 lb and weight is stable over the past 3 months, up from weight in 01/2021.  Per notes: - alcohol withdrawal seizures  - intubated for airway protection   Patient is currently intubated on ventilator support MV: 6.2 L/min Temp (24hrs), Avg:100.4 F (38 C), Min:98.5 F (36.9 C), Max:100.8 F (38.2 C) Propofol: 12.4 ml/hr (327 kcal/24 hrs) BP: 157/75 and MAP: 102  Labs reviewed; CBGs: 121 and 99 mg/dl, K: 3.1 mmol/l, creatinine: 1.51 mg/dl, Ca: 8.8 mg/dl,  Mg: 1.4 mg/dl, GFR: 38 ml/min.   Medications reviewed; 100 mg colace BID, 20 mg IV lasix/day, sliding scale novolog, 40 mg oral protonix/day, 17 g miralax/day, 10 mEq IV KCl x2 runs 1/2, 100 mg IV thiamine/day.   Drips; propofol @ 15 mcg/kg/min, fentanyl @ 150 mcg/hr.    NUTRITION - FOCUSED PHYSICAL EXAM:  Completed; no muscle or fat depletions, mild pitting edema to BLE.   Diet Order:   Diet Order             Diet NPO time specified  Diet effective now                   EDUCATION NEEDS:   No education needs have been identified at this time  Skin:  Skin Assessment: Reviewed RN Assessment  Last BM:  PTA/unknown  Height:   Ht Readings from Last 1 Encounters:  08/31/21 5\' 1"  (1.549 m)    Weight:   Wt Readings from Last 1 Encounters:  08/31/21 (!) 138 kg    Estimated Nutritional Needs:  Kcal:  1518-1795 kcal Protein:  >/= 119 grams Fluid:  >/= 1.7 L/day      Jarome Matin, MS, RD, LDN Inpatient Clinical Dietitian RD pager # available in [place]  After hours/weekend pager # available in [facility]

## 2021-08-31 NOTE — Assessment & Plan Note (Signed)
·   Patient exhibiting bouts of severe acute hypoxia after each episode of seizure activity.  Unclear as to whether severe hypoxia is due to the seizure itself or due to the seizure terminating benzodiazepine that was administered.  Patient had to be bagged valve ventilated temporarily after each episode making patient's airway extremely high risk  As mentioned above we will proceed with intubation and placed on Versed for sedation.  Please see remainder of assessment and plan above.

## 2021-08-31 NOTE — Progress Notes (Signed)
EEG complete - results pending 

## 2021-08-31 NOTE — ED Notes (Addendum)
Called into room by patient's nurse due to seizure activity. Seizure lasted less than 10 seconds, but patient quickly became apneic and dropped to 35% RA. Patient was assisted with ventilation via BVM with return of normal sats. MD was called to bedside. Patient still noted to be apneic, placed on non-rebreather due to nasal canula being insufficient. Patient secretions suctioned, patient repositioned, NRB placed at 15L and 10 mg IV diazepam administered per verbal order from [place], MD. Patient currently stabilized, will continue to monitor for seizure activity or decompensation.

## 2021-08-31 NOTE — ED Notes (Signed)
Patient found to have another seizure. Dr. Florina Ou at bedside, verbal order for 2mg  IV Ativan.

## 2021-08-31 NOTE — Consult Note (Addendum)
NAME:  [name] MRN:  [id number] DOB:  1956-01-08 LOS: 0 ADMISSION DATE:  08/30/2021  CONSULTATION DATE:  08/31/2021 REFERRING MD:  Dr. Inda Merlin of [facility]. Referred in ER  REASON FOR CONSULTATION:  acute hypoxemic respiratory failure   Initial Pulmonary/Critical Care Consultation  Brief History    This 66 y.o. morbidly obese African American female nonsmoker and breast cancer survivor is seen in consultation at the request of Dr. Inda Merlin for recommendations on further evaluation and management of acute hypoxemic respiratory failure and alcohol withdrawal seizures.  The patient presented to [facility] Emergency Department on 08/31/2021 via EMS with complaints of witnessed seizure activity at home.  The patient's family apparently reported that the patient is an alcoholic who has recently decided to stop dirnking cold [place].  In the ER, the patient had witnessed episodes of seizure activity that were treated with benzodiazepines (Valium, Ativan), which successfully stopped the seizure activity but was associated with recurrent apneic spells with hypoxia, prompting endotracheal intubation.  Neurology was consulted from the ER and has advised continuing with treatment of seizure activity with benzodiazepines as needed but advised against loading with antiepileptic drugs for now.  Past Medical/Surgical/Social/Family History   has a past medical history of Alcohol dependence ([place]), Aortic atherosclerosis ([place]), Arthritis, Cancer ([place]) (11/2019), Chronic heart failure ([place]), Chronic kidney disease, stage 3 ([place]), COVID-19, Diverticulitis, Family history of ovarian cancer, Heart murmur, Hypertension, Right nephrolithiasis, Thoracic aortic aneurysm, and Tubular adenoma polyp of rectum.  has a past surgical history that includes Tubal ligation; Radial head arthroplasty (Right, 06/25/2019); Multiple extractions with alveoloplasty (Bilateral, 12/14/2019); Mastectomy with axillary lymph node  dissection (Left, 12/19/2019); Portacath placement (N/A, 12/19/2019); Colonoscopy with propofol (N/A, 02/24/2021); and polypectomy (02/24/2021).  BREAST CANCER - Dr Magrinat/ Wilber Bihari [name] (pronounced "yellow") had routine screening mammography on 10/24/2019 showing a possible abnormality in the left breast. She underwent left breast ultrasonography at [facility] on 11/05/2019 showing: suspicious 3.8 cm mass in left breast at 3 o'clock; five abnormal lymph nodes. # Hx of left breast cancer pT2 pN1, stage IIB diagnosed in 2021. She is s/p mastectomy, chemotherapy and radiation.. On Anastrozole.  Marland Kitchen  #Likely cirrhosi - see [facility] - last seen may 2022 # Possible Cirrhosis demonstrated on CT scan in July 2021. Fib-4 is 2.27 ( further investigation needed). INR elevated in December. Recent platelet count 163 and albumin 3.4. History of Etoh abuse  ( in remission) and high risk for fatty liver disease -  Micro Data:  08/31/21 - covid pcr - neg 08/31/21  flu pcr - neg 08/31/21 - MRSA PCR - neg      Antimicrobials:  1/2 - ceftriaxone >>>  Significant Hospital Events   1/2: presented to ER with alcohol withdrawal seizures, intubated in ER   Interim history/subjective:   08/31/21 2nd pccm round in [place] ER -currently intubated in the emergency department on ventilator.  Has a temperature of 100.8.  Has received ceftriaxone x1.  He has a right-sided Port-A-Cath.  Has Foley catheter.  Is deeply sedated with fentanyl infusion and Versed infusion.  Hypertensive based on a 4-hour blood pressure cuff.  In the ED postintubation she has vomited x1 despite OG tube and was bilious in color.   Objective   BP (!) 176/122    Pulse 95    Temp (!) 100.8 F (38.2 C)    Resp (!) 8    Ht 5\' 1"  (1.549 m)    Wt Marland Kitchen)  138 kg    SpO2 100%    BMI 57.48 kg/m     Filed Weights   08/31/21 0400  Weight: (!) 138 kg    Intake/Output Summary (Last 24 hours) at 08/31/2021 0841 Last data filed at  08/31/2021 0754 Gross per 24 hour  Intake 129.8 ml  Output --  Net 129.8 ml    Vent Mode: PRVC FiO2 (%):  [100 %] 100 % Set Rate:  [14 bmp-16 bmp] 14 bmp Vt Set:  [380 mL-410 mL] 380 mL PEEP:  [5 cmH20-8 cmH20] 8 cmH20 Plateau Pressure:  [20 cmH20] 20 cmH20   Examination: General Appearance:  Looks criticall ill OBESE - +.  Head:  Normocephalic, without obvious abnormality, atraumatic Eyes:  PERRL - yes, conjunctiva/corneas - muddy     Ears:  Normal external ear canals, both ears Nose:  G tube - no Throat:  ETT TUBE - yes , OG tube - YES Neck:  Supple,  No enlargement/tenderness/nodules Lungs: Clear to auscultation bilaterally, Ventilator   Synchrony - yes 40% Heart:  S1 and S2 normal, no murmur, CVP - no.  Pressors - no Abdomen:  Soft, no masses, no organomegaly BUT OBESE wit pannus and foul smell in intertriginous areas Genitalia / Rectal:  Not done Extremities:  Extremities- intact Skin:  ntact in exposed areas . Sacral area - not examined Neurologic:  Sedation -fentanyl infusion Versed infusion through the Port-A-Cath-> RASS - -4 . Moves all 4s -not at this time. CAM-ICU -cannot assess. Orientation -not oriented      Resolved Hospital Problem list      Assessment & Plan:   ASSESSMENT/PLAN:  ASSESSMENT (included in the Hospital Problem List)  Principal Problem:   Acute respiratory failure with hypoxia ([place]) Active Problems:   Essential hypertension   Alcoholic hepatitis   Morbid obesity with BMI of 60.0-69.9, adult (HCC)   Thrombocytopenia (HCC)   Chronic kidney disease, stage 3a (HCC)   Chronic diastolic CHF (congestive heart failure) (HCC)   Alcohol withdrawal seizure with delirium ([place])   UTI (urinary tract infection)   Alcohol withdrawal seizure ([place])   By systems: PULMONARY Acute hypoxemic respiratory failure Endotracheal intubation Trach aspirate for Gram stain, C/S Titrate vent settings based on ABG results Sedation with propofol.  Titrate for  RASS -2. Albuterol PRN   CARDIOVASCULAR Hypertension Furosemide 20 mg IV daily.   Start carvedilol 6.25 mg PO BID. Home antihypertensives include carvedilol 12.5 mg PO BID and furosemide 20 mg PO daily.   RENAL Chronic kidney disease, stage IIIA Monitor urine output Renal dosing of medications Avoid nephrotoxic drugs Place Foley catheter   GASTROINTESTINAL: No acute issues GI PROPHYLAXIS:  Protonix   HEMATOLOGIC/ONCOLOGIC History of breast cancer Thrombocytopenia, perhaps due to chronic alcoholic liver disease Continue Arimidex DVT PROPHYLAXIS: heparin   INFECTIOUS UTI/bacteriuria and pyuria Empiric Rocephin  ENDOCRINE: No acute issues   NEUROLOGIC Alcohol withdrawal seizures Propofol for sedation on ventilator. Versed PRN for seizures EEG. Neurology help appreciated.   PLAN/RECOMMENDATIONS  Transfer to ICU for further evaluation and management of ACUTE HYPOXEMIC RESPIRATORY FAILURE and ALCOHOL WITHDRAWAL SEIZURES. See above.     Acute respiratory failure due to agitated encephalopathy from alcohol withdrawal and subsequent obtundation -resulting in intubation upon admission 08/31/2021 - Present on Admit   08/31/2021 -> on the ventilator FiO2 40%  P:   PRVC VAP bundle SBT as indicated      Chronic pain - Prior to & Present on Admit.  On tramadol, gabapentin at home Chronic alcoholism -  possibly relapse [most recently in remission May 2022) - Prior to & Present on Admit Agitated encephalopathy with seizures due to alcohol withdrawal after quitting cold [place] - Present on Admit  08/31/21 -deeply sedated l fentanyl fusion and Versed infusion. CT HEad normal  P:   Fentanyl infusion with as needed Versed infusion -> changed to propofol Continue Versed as needed RASS sedation score 0 to -2 Hold home pain medications Thiamine and folic acid EEG       History of hypertension - Prior to & Present on Admit.  On Lasix, carvedilol  08/31/2021: Hypertensive in  the ER.  Possible cuff issue   P:  Place To get true measure of blood pressure Continue carvedilol and Lasix for the moment Hopefully propofol can help with blood pressure Hydralazine as needed Systolic blood pressure goal less than 170   History of grade 2 diastolic dysfunction on echo April 2022  - Prior to & Present on Admit  P: Continue Lasix Check troponin Might need repeat echo   Sinus tachycardia.  QTc 461 ms upon admission   P: monitor      At risk aspiration an dlikely aspirated in ER post intubation - Present on Admit   P:   Check PCT Check urine strep Check urine leg Await trach aspirate Empriic unasyn     CKD - baseline creat 1.3 - 2mg % in 2021/2022  08/31/2021 - admit creat 1.5mg %  P:  Hydrate and monitor   Mild hypokalemia at admit - Present on Admit  P: Replete and monitor      Cirrhosis - Prior to & Present on Admit  -documented on CT abdomen July 2021  -GI evaluation May 2022 did not follow-up with recommendations for right upper quadrant ultrasound Known sigmoid diverticulosis - Prior to & Present on Admit  Vomited post intubation in ER - Present on Admit  Mild transamiitis and hyperbilirubinemai - Present on Admit    P:   OG tube to low intermittent suction Check amylase and lipase and liver function test No tube feedings 08/31/21 D5 LR for hydration Right upper quadrant ultrasound Track LFT Might need hep virus panel     Chronic anemia -Baseline hemoglobin between 8.8 and 9.3 g% - Prior to & Present on Admit  08/31/2021 -hemoglobin 11.9 g% with admission   P:  - PRBC for hgb </= 6.9gm%    - exceptions are   -  if ACS susepcted/confirmed then transfuse for hgb </= 8.0gm%,  or    -  active bleeding with hemodynamic instability, then transfuse regardless of hemoglobin value   At at all times try to transfuse 1 unit prbc as possible with exception of active hemorrhage    New onset thrombocytopenia mild at admission  -platelet 132K  (baseline 163K)  -Could be due to alcoholism acute illness  P Monitor with anticoagulation   Morbid obesity - Prior to & Present on Admit  At risk for hypo and hyperglycemia  P:   HgbA1c check Ssi    MSK/DERM Morbid Obesity - Prior to & Present on Admit Protein calorie malnutrition -moderate despite obesity [baseline albumin 2.6 g%) - Prior to & Present on Admit Breast Cancer - Prior to & Present on Admit   Plan  - continue arimidex       Best practice:  Diet: NPO exdcept meds and decide 09/01/21 Pain/Anxiety/Delirium protocol (if indicated): propofol and fent gtt VAP protocol (if indicated): YES DVT prophylaxis: heparin GI prophylaxis: Protonix Glucose control: N/A Mobility/Activity:  bedrest   Code Status: Prior Full code  Family Communication:   no family at bedside   - > call listed friend Janece Canterbury [phone] and updated 9:22 am 08/31/21  Disposition: transfer to ICU from ER. PICC liine ordered      ATTESTATION & SIGNATURE   The patient [name] is critically ill with multiple organ systems failure and requires high complexity decision making for assessment and support, frequent evaluation and titration of therapies, application of advanced monitoring technologies and extensive interpretation of multiple databases.   Critical Care Time devoted to patient care services described in this note is  60  Minutes. This time reflects time of care of this signee Dr Brand Males. This critical care time does not reflect procedure time, or teaching time or supervisory time of PA/NP/Med student/Med Resident etc but could involve care discussion time     Dr. Brand Males, M.D., [facility].C.P Pulmonary and Critical Care Medicine Staff Physician [facility] Pager: [phone], If no answer or between  15:00h - 7:00h: call [phone]  08/31/2021 8:42 AM   LABS    PULMONARY Recent Labs   Lab 08/31/21 0148  PHART 7.366  PCO2ART 47.3  PO2ART 307*  HCO3 26.5  O2SAT 99.9    CBC Recent Labs  Lab 08/31/21 0001  HGB 11.9*  HCT 35.7*  WBC 9.9  PLT 132*    COAGULATION No results for input(s): INR in the last 168 hours.  CARDIAC  No results for input(s): TROPONINI in the last 168 hours. No results for input(s): PROBNP in the last 168 hours.   CHEMISTRY Recent Labs  Lab 08/31/21 0001  NA 138  K 3.1*  CL 101  CO2 21*  GLUCOSE 156*  BUN 16  CREATININE 1.51*  CALCIUM 8.8*   Estimated Creatinine Clearance: 48.5 mL/min (A) (by C-G formula based on SCr of 1.51 mg/dL (H)).   LIVER Recent Labs  Lab 08/31/21 0001  AST 59*  ALT 35  ALKPHOS 60  BILITOT 1.6*  PROT 8.2*  ALBUMIN 3.8     INFECTIOUS No results for input(s): LATICACIDVEN, PROCALCITON in the last 168 hours.   ENDOCRINE CBG (last 3)  No results for input(s): GLUCAP in the last 72 hours.       IMAGING x48h  - image(s) personally visualized  -   highlighted in bold CT Head Wo Contrast  Result Date: 08/31/2021 CLINICAL DATA:  Seizure-like activity. EXAM: CT HEAD WITHOUT CONTRAST TECHNIQUE: Contiguous axial images were obtained from the base of the skull through the vertex without intravenous contrast. COMPARISON:  June 10, 2021 FINDINGS: Brain: No evidence of acute infarction, hemorrhage, hydrocephalus, extra-axial collection or mass lesion/mass effect. Vascular: No hyperdense vessel or unexpected calcification. Skull: Normal. Negative for fracture or focal lesion. Sinuses/Orbits: There is a 2.4 cm x 1.5 cm left maxillary sinus polyp versus mucous retention cyst. Other: None. IMPRESSION: 1. No acute intracranial pathology. 2. Left maxillary sinus polyp versus mucous retention cyst. Electronically Signed   By: Virgina Norfolk M.D.   On: 08/31/2021 00:41   DG Chest Port 1 View  Result Date: 08/31/2021 CLINICAL DATA:  Check intubation and enteric tube positioning. EXAM: PORTABLE CHEST 1  VIEW COMPARISON:  Portable chest 08/01/2019. FINDINGS: Enteric tube is well inside the stomach but neither the proximal side-hole or tube terminus are included in the exam. ETT tip is 1.5 cm over the carina. Old right subclavian port catheter  tip remains at the cavoatrial junction. There is moderate cardiomegaly. Mild perihilar vascular congestion is noted without evidence of overt edema. There is a low inspiration with perihilar atelectatic bands but no convincing focal pneumonia, with limited view of the lower zones. No significant pleural effusion is seen. The aerated lungs are otherwise clear. There are left axillary surgical clips. IMPRESSION: 1. ETT tip is 1.5 cm from the carina. NGT is well inside the stomach but the proximal side hole and tip are not included. 2. Cardiomegaly with mild perihilar vascular congestion without overt edema. 3. Limited evaluation of the lungs due to low lung volumes. No convincing focal pneumonia. Follow-up study recommended in full inspiration. Electronically Signed   By: Telford Nab M.D.   On: 08/31/2021 05:12   [place] EKG SITE RITE  Result Date: 08/31/2021 If Site Rite image not attached, placement could not be confirmed due to current cardiac rhythm.

## 2021-08-31 NOTE — ED Notes (Signed)
Patient began to become combative, pulling at equipment, NRB, port, and attempting to hit staff. Ativan administered per PRN order. Respiratory at bedside for ABG.

## 2021-08-31 NOTE — Progress Notes (Signed)
Difficulty obtaining accurate cuff pressures this AM. Son notified this RN of R leg injury a few months ago. Switched BP cuff to LLE. SBP in 130-150s. MD Ramaswammy notified. Verbal order to cancel art line placement.

## 2021-08-31 NOTE — Procedures (Signed)
Patient Name: [name]  MRN: [id number]  Epilepsy Attending: Lora Havens  Referring Physician/Provider: Brand Males, MD Date: 08/31/2021 Duration: 23.18 mins  Patient history: 66yo F presented to the emergency department with extreme agitation and delirium followed by 2 episodes so far of seizure-like activity as witnessed by nursing staff. EEG to evaluate for seizure  Level of alertness:  comatose  AEDs during EEG study: Propofol, ativan  Technical aspects: This EEG study was done with scalp electrodes positioned according to the 10-20 International system of electrode placement. Electrical activity was acquired at a sampling rate of 500Hz  and reviewed with a high frequency filter of 70Hz  and a low frequency filter of 1Hz . EEG data were recorded continuously and digitally stored.   Description: EEG showed continuous generalized polymorphic asynchronous 3 to 6 Hz theta-delta slowing admixed with 15-18hz  generalized beta activity. There was also 2-4 seconds of generalized eeg attenuation. Hyperventilation and photic stimulation were not performed.     ABNORMALITY - Continuous slow, generalized  IMPRESSION: This study is suggestive of severe diffuse encephalopathy, nonspecific etiology but likely related to sedation. No seizures or definite epileptiform discharges were seen throughout the recording.  Jerusalem Brownstein Barbra Sarks

## 2021-08-31 NOTE — Progress Notes (Signed)
eLink Physician-Brief Progress Note Patient Name: Shardae Kleinman DOB: 1956-01-07 MRN: [id number]   Date of Service  08/31/2021  HPI/Events of Note  Requesting for arterial line due to unreliable and fluctuating BP on cuff with cuff being on the leg currently.  eICU Interventions  Aline insertion ordered     Intervention Category Intermediate Interventions: Hypotension - evaluation and management  Judd Lien 08/31/2021, 9:21 PM

## 2021-08-31 NOTE — Assessment & Plan Note (Signed)
•   Strict intake and output monitoring  Creatinine currently 1.5, close to baseline that ranges between 1.3 and 1.5.  Minimizing nephrotoxic agents as much as possible  Serial chemistries to monitor renal function and electrolytes

## 2021-09-01 ENCOUNTER — Inpatient Hospital Stay: Payer: Self-pay

## 2021-09-01 ENCOUNTER — Inpatient Hospital Stay (HOSPITAL_COMMUNITY): Payer: Medicare Other

## 2021-09-01 DIAGNOSIS — J9601 Acute respiratory failure with hypoxia: Secondary | ICD-10-CM | POA: Diagnosis not present

## 2021-09-01 LAB — CBC
HCT: 32.3 % — ABNORMAL LOW (ref 36.0–46.0)
Hemoglobin: 11.1 g/dL — ABNORMAL LOW (ref 12.0–15.0)
MCH: 33.5 pg (ref 26.0–34.0)
MCHC: 34.4 g/dL (ref 30.0–36.0)
MCV: 97.6 fL (ref 80.0–100.0)
Platelets: 84 10*3/uL — ABNORMAL LOW (ref 150–400)
RBC: 3.31 MIL/uL — ABNORMAL LOW (ref 3.87–5.11)
RDW: 13.7 % (ref 11.5–15.5)
WBC: 10.8 10*3/uL — ABNORMAL HIGH (ref 4.0–10.5)
nRBC: 0 % (ref 0.0–0.2)

## 2021-09-01 LAB — BLOOD GAS, ARTERIAL
Acid-Base Excess: 0.2 mmol/L (ref 0.0–2.0)
Bicarbonate: 25.4 mmol/L (ref 20.0–28.0)
FIO2: 45
O2 Saturation: 99.2 %
Patient temperature: 98.4
pCO2 arterial: 46.3 mmHg (ref 32.0–48.0)
pH, Arterial: 7.358 (ref 7.350–7.450)
pO2, Arterial: 163 mmHg — ABNORMAL HIGH (ref 83.0–108.0)

## 2021-09-01 LAB — HEMOGLOBIN A1C
Hgb A1c MFr Bld: 5 % (ref 4.8–5.6)
Mean Plasma Glucose: 96.8 mg/dL

## 2021-09-01 LAB — MAGNESIUM
Magnesium: 1.2 mg/dL — ABNORMAL LOW (ref 1.7–2.4)
Magnesium: 1.4 mg/dL — ABNORMAL LOW (ref 1.7–2.4)

## 2021-09-01 LAB — PROTIME-INR
INR: 1.2 (ref 0.8–1.2)
Prothrombin Time: 15.6 seconds — ABNORMAL HIGH (ref 11.4–15.2)

## 2021-09-01 LAB — GLUCOSE, CAPILLARY
Glucose-Capillary: 104 mg/dL — ABNORMAL HIGH (ref 70–99)
Glucose-Capillary: 114 mg/dL — ABNORMAL HIGH (ref 70–99)
Glucose-Capillary: 120 mg/dL — ABNORMAL HIGH (ref 70–99)
Glucose-Capillary: 80 mg/dL (ref 70–99)
Glucose-Capillary: 82 mg/dL (ref 70–99)
Glucose-Capillary: 91 mg/dL (ref 70–99)

## 2021-09-01 LAB — PHOSPHORUS
Phosphorus: 3.7 mg/dL (ref 2.5–4.6)
Phosphorus: 4.7 mg/dL — ABNORMAL HIGH (ref 2.5–4.6)

## 2021-09-01 LAB — TRIGLYCERIDES
Triglycerides: 113 mg/dL (ref <150)
Triglycerides: 113 mg/dL (ref <150)

## 2021-09-01 LAB — COMPREHENSIVE METABOLIC PANEL
ALT: 20 U/L (ref 0–44)
AST: 21 U/L (ref 15–41)
Albumin: 3.1 g/dL — ABNORMAL LOW (ref 3.5–5.0)
Alkaline Phosphatase: 46 U/L (ref 38–126)
Anion gap: 9 (ref 5–15)
BUN: 21 mg/dL (ref 8–23)
CO2: 26 mmol/L (ref 22–32)
Calcium: 7.9 mg/dL — ABNORMAL LOW (ref 8.9–10.3)
Chloride: 103 mmol/L (ref 98–111)
Creatinine, Ser: 2.1 mg/dL — ABNORMAL HIGH (ref 0.44–1.00)
GFR, Estimated: 26 mL/min — ABNORMAL LOW (ref >60)
Glucose, Bld: 102 mg/dL — ABNORMAL HIGH (ref 70–99)
Potassium: 3.2 mmol/L — ABNORMAL LOW (ref 3.5–5.1)
Sodium: 138 mmol/L (ref 135–145)
Total Bilirubin: 1.4 mg/dL — ABNORMAL HIGH (ref 0.3–1.2)
Total Protein: 6.5 g/dL (ref 6.5–8.1)

## 2021-09-01 LAB — TROPONIN I (HIGH SENSITIVITY)
Troponin I (High Sensitivity): 174 ng/L (ref <18)
Troponin I (High Sensitivity): 194 ng/L (ref <18)

## 2021-09-01 LAB — LACTIC ACID, PLASMA: Lactic Acid, Venous: 0.8 mmol/L (ref 0.5–1.9)

## 2021-09-01 LAB — PROCALCITONIN: Procalcitonin: 0.66 ng/mL

## 2021-09-01 LAB — LIPASE, BLOOD: Lipase: 20 U/L (ref 11–51)

## 2021-09-01 LAB — AMYLASE: Amylase: 30 U/L (ref 28–100)

## 2021-09-01 MED ORDER — LACTATED RINGERS IV BOLUS
500.0000 mL | Freq: Once | INTRAVENOUS | Status: AC
  Administered 2021-09-01: 500 mL via INTRAVENOUS

## 2021-09-01 MED ORDER — POTASSIUM CHLORIDE CRYS ER 20 MEQ PO TBCR: 20.0000 meq | EXTENDED_RELEASE_TABLET | Freq: Once | ORAL | Status: DC

## 2021-09-01 MED ORDER — POTASSIUM CHLORIDE 20 MEQ PO PACK
20.0000 meq | PACK | Freq: Once | ORAL | Status: DC
  Filled 2021-09-01: qty 1

## 2021-09-01 MED ORDER — LACTATED RINGERS IV SOLN: INTRAVENOUS | Status: AC

## 2021-09-01 MED ORDER — FENTANYL BOLUS VIA INFUSION
100.0000 ug | INTRAVENOUS | Status: DC | PRN
  Administered 2021-09-01 (×4): 100 ug via INTRAVENOUS
  Filled 2021-09-01: qty 100

## 2021-09-01 MED ORDER — VITAL HIGH PROTEIN PO LIQD: 1000.0000 mL | ORAL | Status: DC

## 2021-09-01 MED ORDER — MAGNESIUM SULFATE 2 GM/50ML IV SOLN
2.0000 g | Freq: Once | INTRAVENOUS | Status: DC
Start: 2021-09-01 — End: 2021-09-01

## 2021-09-01 MED ORDER — MIDAZOLAM HCL 2 MG/2ML IJ SOLN
2.0000 mg | INTRAMUSCULAR | Status: DC | PRN
  Administered 2021-09-01 – 2021-09-04 (×2): 2 mg via INTRAVENOUS
  Filled 2021-09-01: qty 2

## 2021-09-01 MED ORDER — MAGNESIUM SULFATE 4 GM/100ML IV SOLN
4.0000 g | Freq: Once | INTRAVENOUS | Status: AC
  Administered 2021-09-01: 4 g via INTRAVENOUS
  Filled 2021-09-01: qty 100

## 2021-09-01 MED ORDER — VITAL AF 1.2 CAL PO LIQD
1000.0000 mL | ORAL | Status: DC
  Administered 2021-09-01 – 2021-09-04 (×3): 1000 mL
  Filled 2021-09-01: qty 1000

## 2021-09-01 MED ORDER — POTASSIUM CHLORIDE 10 MEQ/100ML IV SOLN
10.0000 meq | INTRAVENOUS | Status: AC
  Administered 2021-09-01 (×2): 10 meq via INTRAVENOUS
  Filled 2021-09-01 (×2): qty 100

## 2021-09-01 MED ORDER — PROSOURCE TF PO LIQD
90.0000 mL | Freq: Two times a day (BID) | ORAL | Status: DC
  Administered 2021-09-01 – 2021-09-04 (×7): 90 mL
  Filled 2021-09-01 (×7): qty 90

## 2021-09-01 MED ORDER — MAGNESIUM SULFATE IN D5W 1-5 GM/100ML-% IV SOLN
1.0000 g | Freq: Once | INTRAVENOUS | Status: AC
  Administered 2021-09-01: 1 g via INTRAVENOUS
  Filled 2021-09-01: qty 100

## 2021-09-01 NOTE — Progress Notes (Signed)
NAME:  Karington Zarazua MRN:  [id number] DOB:  1956-08-01 LOS: 1 ADMISSION DATE:  08/30/2021  CONSULTATION DATE:  08/31/2021 REFERRING MD:  Dr. Inda Merlin of [facility]. Referred in ER  REASON FOR CONSULTATION:  acute hypoxemic respiratory failure   Brief History    This 66 y.o. morbidly obese African American female nonsmoker and breast cancer survivor is seen in consultation at the request of Dr. Inda Merlin for recommendations on further evaluation and management of acute hypoxemic respiratory failure and alcohol withdrawal seizures.  The patient presented to [facility] Emergency Department on 08/31/2021 via EMS with complaints of witnessed seizure activity at home.  The patient's family apparently reported that the patient is an alcoholic who has recently decided to stop dirnking cold [place].  In the ER, the patient had witnessed episodes of seizure activity that were treated with benzodiazepines (Valium, Ativan), which successfully stopped the seizure activity but was associated with recurrent apneic spells with hypoxia, prompting endotracheal intubation.  Neurology was consulted from the ER and has advised continuing with treatment of seizure activity with benzodiazepines as needed but advised against loading with antiepileptic drugs for now.  Past Medical/Surgical/Social/Family History   has a past medical history of Alcohol dependence ([place]), Aortic atherosclerosis ([place]), Arthritis, Cancer ([place]) (11/2019), Chronic heart failure ([place]), Chronic kidney disease, stage 3 ([place]), COVID-19, Diverticulitis, Family history of ovarian cancer, Heart murmur, Hypertension, Right nephrolithiasis, Thoracic aortic aneurysm, and Tubular adenoma polyp of rectum.  has a past surgical history that includes Tubal ligation; Radial head arthroplasty (Right, 06/25/2019); Multiple extractions with alveoloplasty (Bilateral, 12/14/2019); Mastectomy with axillary lymph node dissection (Left, 12/19/2019); Portacath placement  (N/A, 12/19/2019); Colonoscopy with propofol (N/A, 02/24/2021); and polypectomy (02/24/2021).  BREAST CANCER - Dr Magrinat/ Wilber Bihari [name] (pronounced "yellow") had routine screening mammography on 10/24/2019 showing a possible abnormality in the left breast. She underwent left breast ultrasonography at [facility] on 11/05/2019 showing: suspicious 3.8 cm mass in left breast at 3 o'clock; five abnormal lymph nodes. # Hx of left breast cancer pT2 pN1, stage IIB diagnosed in 2021. She is s/p mastectomy, chemotherapy and radiation.. On Anastrozole.  Marland Kitchen  #Likely cirrhosi - see [facility] - last seen may 2022 # Possible Cirrhosis demonstrated on CT scan in July 2021. Fib-4 is 2.27 ( further investigation needed). INR elevated in December. Recent platelet count 163 and albumin 3.4. History of Etoh abuse  ( in remission) and high risk for fatty liver disease  Micro Data:  08/31/21 - covid pcr - neg 08/31/21  flu pcr - neg 08/31/21 - MRSA PCR - neg  Antimicrobials:  1/2 - ceftriaxone >>>  Significant Hospital Events   1/2: presented to ER with alcohol withdrawal seizures, intubated in ER 1/3 NAEON, moves all 4 ext per RN report, sedated at time of my eval   Interim history/subjective:  NAEON. On low rate prop, good bit of fentanyl gtt. Weaning fentanyl, prioritize propofol as primary agent for sedation given concern for ETOH withdrawal. Pulse pressure variation on A line- small bolus   Objective   BP 90/66    Pulse 96    Temp 99.5 F (37.5 C)    Resp 14    Ht 5\' 1"  (1.549 m)    Wt (!) 138 kg    SpO2 96%    BMI 57.48 kg/m     Filed Weights   08/31/21 0400  Weight: (!) 138 kg    Intake/Output Summary (Last 24 hours) at 09/01/2021 1128  Last data filed at 09/01/2021 0800 Gross per 24 hour  Intake 1356.08 ml  Output 1400 ml  Net -43.92 ml     Vent Mode: PRVC FiO2 (%):  [40 %-60 %] 40 % Set Rate:  [14 bmp] 14 bmp Vt Set:  [380 mL] 380 mL PEEP:  [8 cmH20] 8  cmH20 Plateau Pressure:  [23 SAY30-16 cmH20] 37 cmH20   Examination: General Appearance:  obese, ill appearing Head:  Normocephalic, without obvious abnormality, atraumatic Eyes:  PERRL - yes, conjunctiva/corneas - muddy     Ears:  Normal external ear canals, both ears Throat:  ETT TUBE - yes , OG tube - YES Neck:  Supple,  No enlargement/tenderness/nodules Lungs: Clear to auscultation bilaterally, Heart:  S1 and S2 normal, no murmur, Abdomen:  Soft, no masses, no organomegaly  Genitalia / Rectal:  Not done Extremities:  Extremities- intact Neurologic:  Sedated, rass -3   Resolved Hospital Problem list      Assessment & Plan:   Acute respiratory failure due to agitated encephalopathy from alcohol withdrawal and subsequent obtundation -resulting in intubation upon admission 08/31/2021  with clinical aspiration event   --PRVC, SBT as able --RASS -1, wean fentanyl continue propofol --VAP bundle --Unasyn   Chronic alcoholism -likely relapse [most recently in remission May 2022)  Agitated encephalopathy with seizures due to alcohol withdrawal after quitting cold [place] --CT head normal --moves all extremities --midazolam PRN, wean fentanyl, continue propofol for concern for initial seizure --spot EEG negative 1/2  Hypotension: Likely sedation related, intravascular volume depletion given pulse pressure variation on A-line on 8 cc/kg --stop home carvedilol --500 cc LR bolus  History of grade 2 diastolic dysfunction on echo April 2022  -  --hold lasix 1/3, give fluids as above  CKD4 - baseline creat 1.9 --Cr at baseline, slightly up --fluid bolus challenge 1/3 --start TF 1/3 --hold lasix 1/3, consider resuming in coming days    Cirrhosis - Documented on CT abdomen July 2021. GI evaluation May 2022 did not follow-up with recommendations for right upper quadrant ultrasound Mild transamiitis and hyperbilirubinemai - suspect related to cirrhosis --RUQ [place] 1/2 with sludge no  sign of choelcystitis    Chronic anemia -Baseline hemoglobin between 8.8 and 9.3 g% -  --Hgb stable, CTM   New onset thrombocytopenia (POA) --multifactorial, critical illness, suspect splenic sequestration due to cirrhosis  Breast cancer:  --Arimidex when able    Best practice:  Diet: TF Pain/Anxiety/Delirium protocol (if indicated): propofol and fent gtt VAP protocol (if indicated): YES DVT prophylaxis: heparin GI prophylaxis: Protonix Glucose control: N/A Mobility/Activity: bedrest   Code Status: Prior Full code  Family Communication:  call listed friend Janece Canterbury [phone] and updated 9:22 am 08/31/21  CRITICAL CARE Performed by: Bonna Gains [place]   Total critical care time: 40 minutes  Critical care time was exclusive of separately billable procedures and treating other patients.  Critical care was necessary to treat or prevent imminent or life-threatening deterioration.  Critical care was time spent personally by me on the following activities: development of treatment plan with patient and/or surrogate as well as nursing, discussions with consultants, evaluation of patient's response to treatment, examination of patient, obtaining history from patient or surrogate, ordering and performing treatments and interventions, ordering and review of laboratory studies, ordering and review of radiographic studies, pulse oximetry and re-evaluation of patient's condition.  Lanier Clam, MD See Shea Evans for contact info

## 2021-09-01 NOTE — Progress Notes (Signed)
eLink Physician-Brief Progress Note Patient Name: Ricquel Foulk DOB: Jun 12, 1956 MRN: [id number]   Date of Service  09/01/2021  HPI/Events of Note  Notified of troponin 174 in the back ground of worsening renal function  eICU Interventions  Will trend, repeat in 4 hours     Intervention Category Intermediate Interventions: Other:  Judd Lien 09/01/2021, 6:54 AM

## 2021-09-01 NOTE — Progress Notes (Signed)
Nutrition Follow-up  DOCUMENTATION CODES:   Morbid obesity  INTERVENTION:  - will order TF regimen: Vital AF 1.2 @ 20 ml/hr to advance by 10 ml every 12 hours to reach goal rate of 50 ml/hr with 90 ml Prosource TF BID. - at goal rate, this regimen + kcal from current propofol rate will provide 1819 kcal, 134 grams protein, and 973 ml free water. - free water flush, if desired, to be per CCM.  Monitor magnesium, potassium, and phosphorus BID for at least 3 days, MD to replete as needed, as pt is at risk for refeeding syndrome given recent alcohol binge with abrupt stop, current hypokalemia and hypomagnesemia.   NUTRITION DIAGNOSIS:   Inadequate oral intake related to inability to eat as evidenced by NPO status. -ongoing  GOAL:   Provide needs based on ASPEN/SCCM guidelines -unable to meet at this time.  MONITOR:   Vent status, TF tolerance, Labs, Weight trends  REASON FOR ASSESSMENT:   Consult Enteral/tube feeding initiation and management  ASSESSMENT:   66 year old female with medical history of HTN, alcohol abuse, stage 3 invasive ductal carcinoma of the L breast (dx 10/2019 s/p radiacal mastectomy 11/2019 with adjuctive chemoradiation complete 04/2020), stage 3 CKD, CHF, and anemia of chronic disease. She presented to the ED via EMS after witnessed seizure-like activity. Family reported to EMS staff that patient has alcohol abuse and was recently on an alcohol binge but abruptly stopped drinking 3 days prior. In the ED she had 2 separate 10-second episodes of tonic-clonic seizure activity.  Patient discussed in rounds this AM. She remains intubated with OGT to LIS; RN reports 150 ml out through the rest of day shift yesterday after RD visit. CCM MD confirms ok to being TF today.  No visitors present at the time of RD visit today. RN at bedside.   Non-pitting edema to BLE documented in the edema section of flow sheet.     Patient is currently intubated on ventilator  support MV: 6.2 L/min Temp (24hrs), Avg:98.8 F (37.1 C), Min:97.9 F (36.6 C), Max:100.6 F (38.1 C) Propofol: 8.3 ml/hr (219 kcal/24 hrs)  Labs reviewed; K: 3.2 mmol/l, creatinine: 2.1 mg/dl, Ca: 7.9 mg/dl, Mg: 1.2 mg/dl, GFR: 26 ml/min. Medications reviewed; 1 mg IV folic acid/day, sliding scale novolog, 40 mg IV protonix/day, 17 g miralax/day, 100 mg IV thiamine/day, 10 mEq IV KCl x2 runs 1/3, 1 g IV Mg sulfate x1 run 1/3.    Diet Order:   Diet Order             Diet NPO time specified  Diet effective now                   EDUCATION NEEDS:   No education needs have been identified at this time  Skin:  Skin Assessment: Reviewed RN Assessment  Last BM:  PTA/unknown  Height:   Ht Readings from Last 1 Encounters:  08/31/21 5\' 1"  (1.549 m)    Weight:   Wt Readings from Last 1 Encounters:  08/31/21 (!) 138 kg     Estimated Nutritional Needs:  Kcal:  1518-1795 kcal Protein:  >/= 119 grams Fluid:  >/= 1.7 L/day     Jarome Matin, MS, RD, LDN Inpatient Clinical Dietitian RD pager # available in [place]  After hours/weekend pager # available in [facility]

## 2021-09-01 NOTE — Progress Notes (Addendum)
eLink Physician-Brief Progress Note Patient Name: [name] DOB: [birth year] MRN: [id number]   Date of Service  09/01/2021  HPI/Events of Note  Notified of K+ 3.2, Mg 1.2 Creatinine 2.10, increasing  eICU Interventions  Ordered potassium 20 meqs per tube and magnesium 1 gram IVPB  Addendum: Informed patient not absorbing medications Shifted K replacement to IV     Intervention Category Intermediate Interventions: Electrolyte abnormality - evaluation and management  Judd Lien 09/01/2021, 5:28 AM

## 2021-09-01 NOTE — Progress Notes (Signed)
[place] Progress Note Patient Name: [name] DOB: [birth year] MRN: [id number]   Date of Service  09/01/2021  HPI/Events of Note  Mg+ 1.4  eICU Interventions  Magnesium sulfate 4 gm iv x 1 ordered, a.m. Magnesium check ordered.        [name] 09/01/2021, 8:51 PM

## 2021-09-02 ENCOUNTER — Inpatient Hospital Stay (HOSPITAL_COMMUNITY): Payer: Medicare Other

## 2021-09-02 DIAGNOSIS — J9601 Acute respiratory failure with hypoxia: Secondary | ICD-10-CM | POA: Diagnosis not present

## 2021-09-02 LAB — GLUCOSE, CAPILLARY
Glucose-Capillary: 107 mg/dL — ABNORMAL HIGH (ref 70–99)
Glucose-Capillary: 119 mg/dL — ABNORMAL HIGH (ref 70–99)
Glucose-Capillary: 110 mg/dL — ABNORMAL HIGH (ref 70–99)
Glucose-Capillary: 117 mg/dL — ABNORMAL HIGH (ref 70–99)
Glucose-Capillary: 116 mg/dL — ABNORMAL HIGH (ref 70–99)
Glucose-Capillary: 119 mg/dL — ABNORMAL HIGH (ref 70–99)

## 2021-09-02 LAB — BASIC METABOLIC PANEL
Anion gap: 11 (ref 5–15)
BUN: 26 mg/dL — ABNORMAL HIGH (ref 8–23)
CO2: 22 mmol/L (ref 22–32)
Calcium: 8 mg/dL — ABNORMAL LOW (ref 8.9–10.3)
Chloride: 102 mmol/L (ref 98–111)
Creatinine, Ser: 2.9 mg/dL — ABNORMAL HIGH (ref 0.44–1.00)
GFR, Estimated: 17 mL/min — ABNORMAL LOW (ref >60)
Glucose, Bld: 102 mg/dL — ABNORMAL HIGH (ref 70–99)
Potassium: 3.3 mmol/L — ABNORMAL LOW (ref 3.5–5.1)
Sodium: 135 mmol/L (ref 135–145)

## 2021-09-02 LAB — URINE CULTURE: Culture: >=100000 — AB

## 2021-09-02 LAB — PHOSPHORUS
Phosphorus: 5 mg/dL — ABNORMAL HIGH (ref 2.5–4.6)
Phosphorus: 5 mg/dL — ABNORMAL HIGH (ref 2.5–4.6)

## 2021-09-02 LAB — CBC
HCT: 30.5 % — ABNORMAL LOW (ref 36.0–46.0)
Hemoglobin: 9.7 g/dL — ABNORMAL LOW (ref 12.0–15.0)
MCH: 32.1 pg (ref 26.0–34.0)
MCHC: 31.8 g/dL (ref 30.0–36.0)
MCV: 101 fL — ABNORMAL HIGH (ref 80.0–100.0)
Platelets: 78 10*3/uL — ABNORMAL LOW (ref 150–400)
RBC: 3.02 MIL/uL — ABNORMAL LOW (ref 3.87–5.11)
RDW: 13.5 % (ref 11.5–15.5)
WBC: 9.6 10*3/uL (ref 4.0–10.5)
nRBC: 0 % (ref 0.0–0.2)

## 2021-09-02 LAB — CK: Total CK: 722 U/L — ABNORMAL HIGH (ref 38–234)

## 2021-09-02 LAB — PROCALCITONIN: Procalcitonin: 1.03 ng/mL

## 2021-09-02 LAB — MAGNESIUM
Magnesium: 2.4 mg/dL (ref 1.7–2.4)
Magnesium: 2 mg/dL (ref 1.7–2.4)

## 2021-09-02 IMAGING — US US RENAL
1 series · 15 of 25 positions shown · non-contrast
Comparison: [DATE]

CLINICAL DATA: Renal dysfunction

EXAM:
RENAL / URINARY TRACT ULTRASOUND COMPLETE

[Series 1: us renal mc & wl · 15 of 29 slices shown]
[im 1/29]
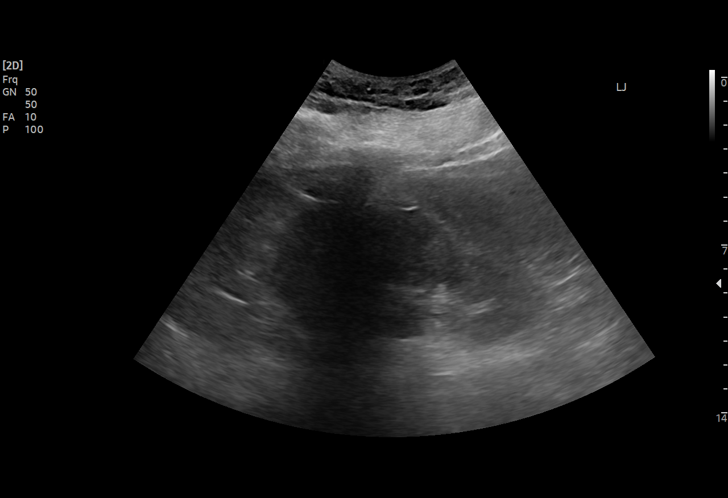
[im 3/29]
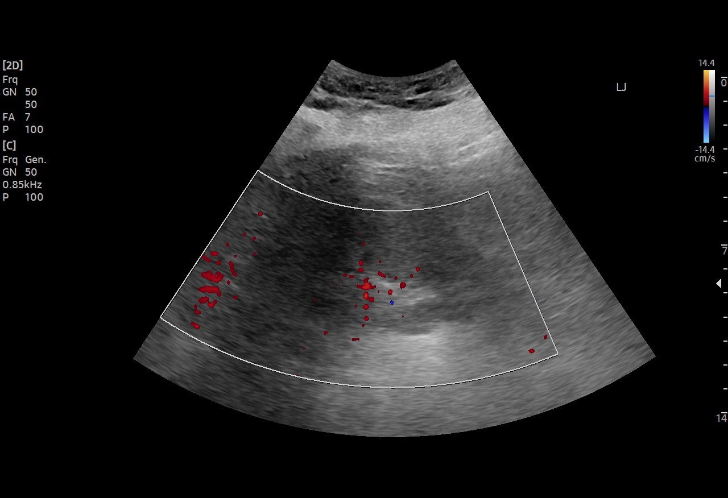
[im 5/29]
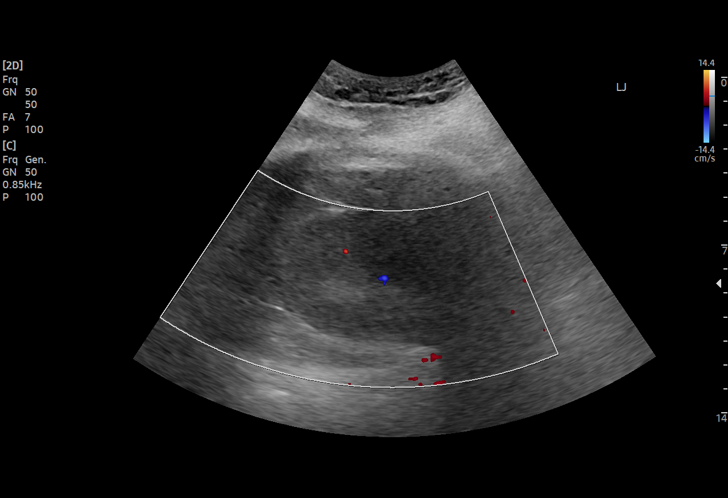
[im 6/29]
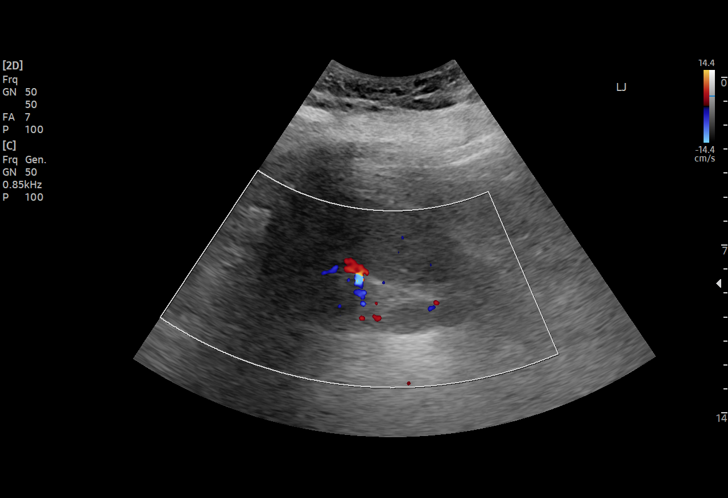
[im 9/29]
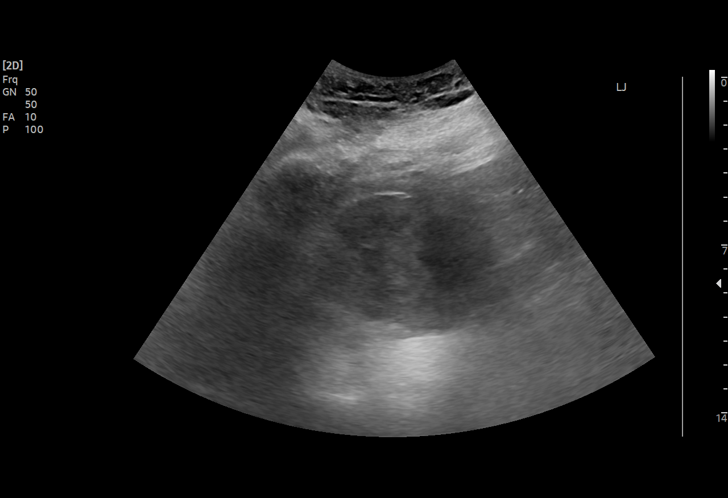
[im 11/29]
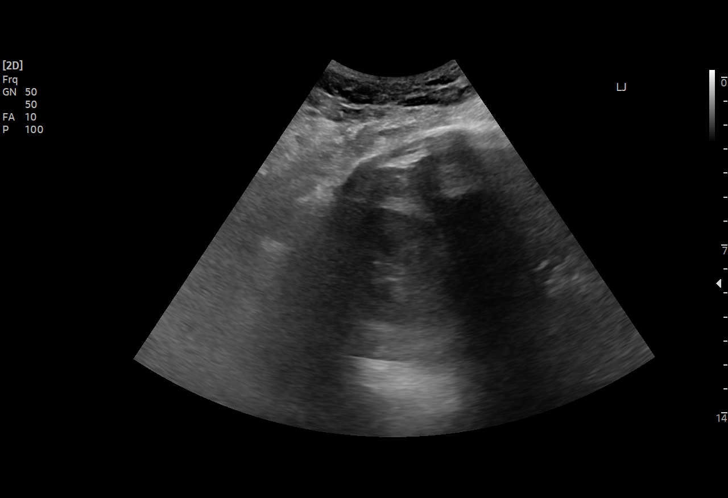
[im 12/29]
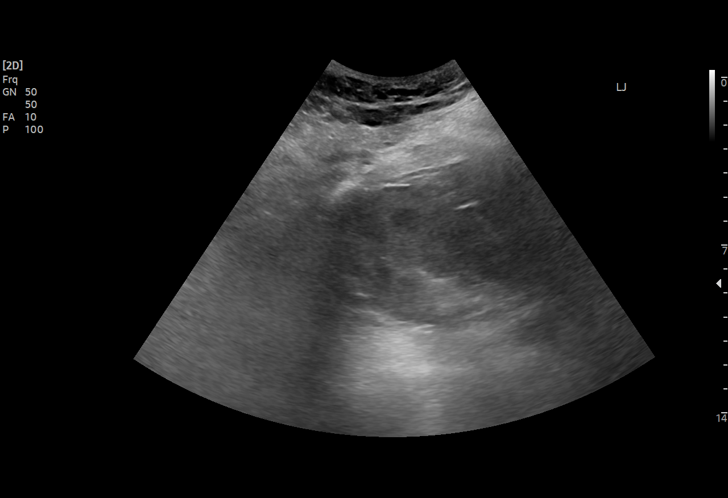
[im 15/29]
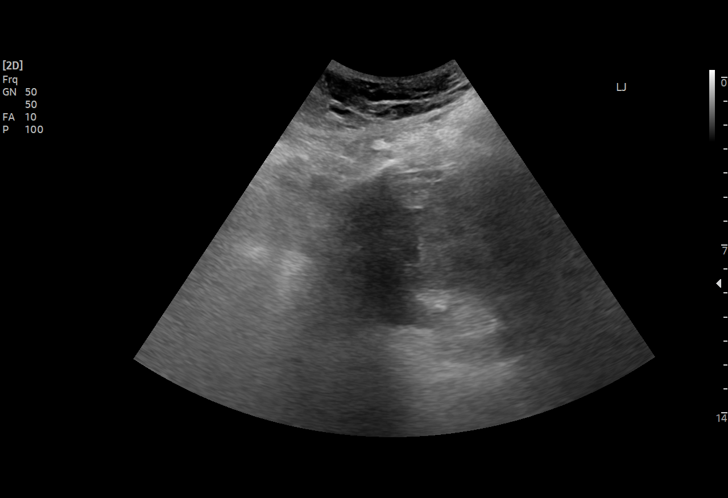
[im 17/29]
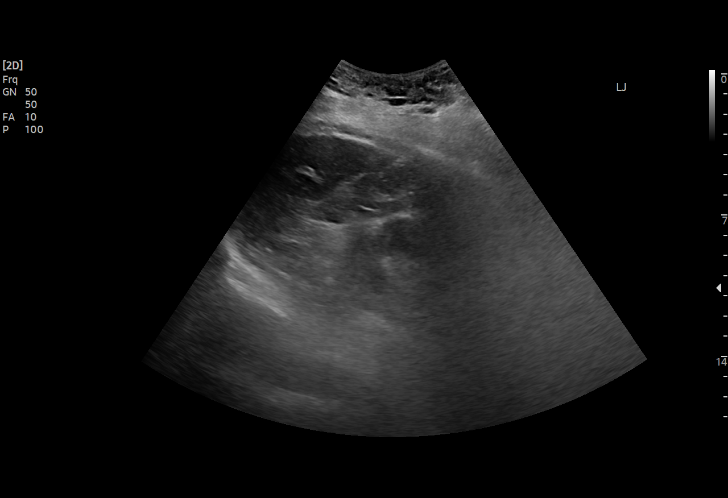
[im 18/29]
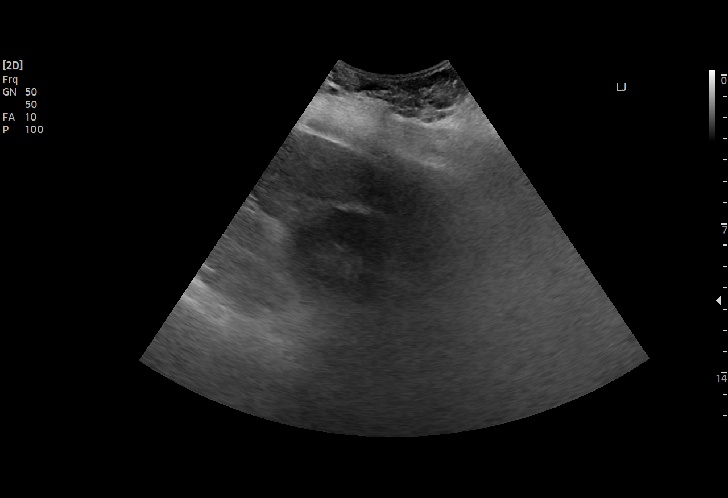
[im 20/29]
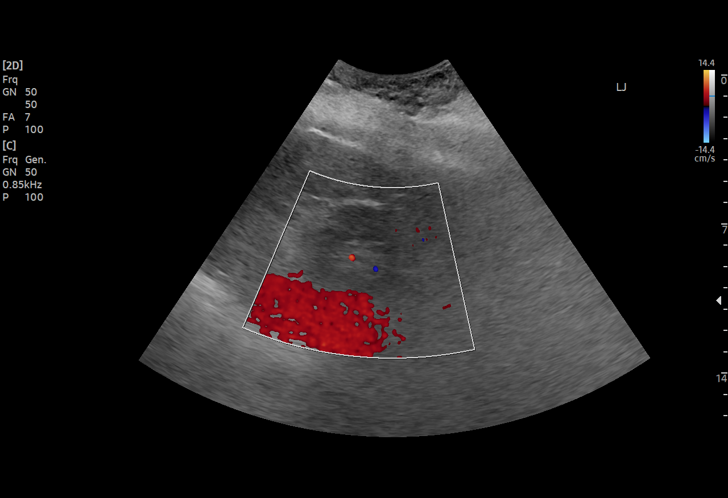
[im 23/29]
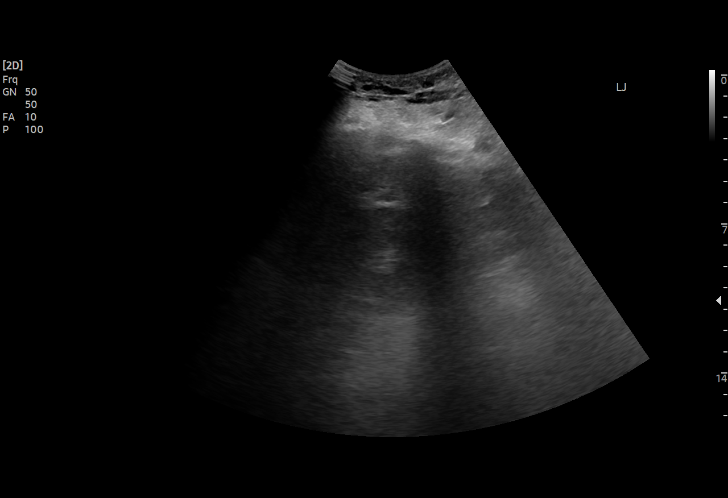
[im 24/29]
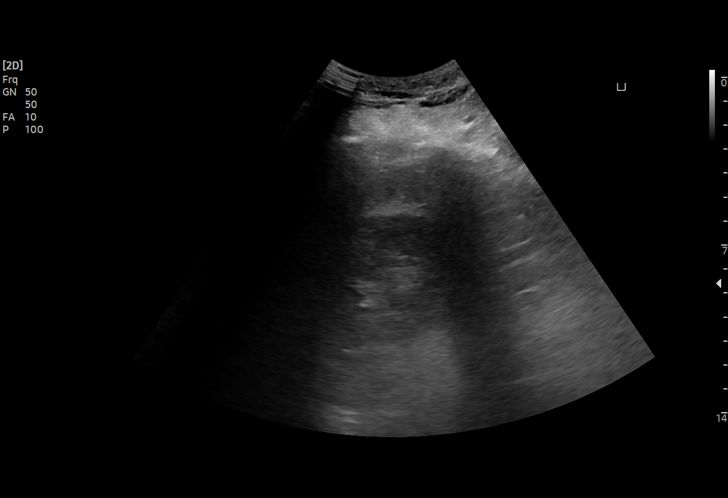
[im 26/29]
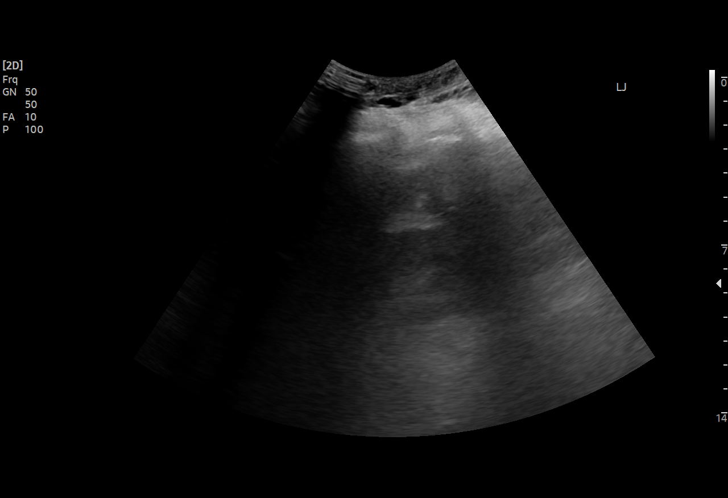
[im 29/29]
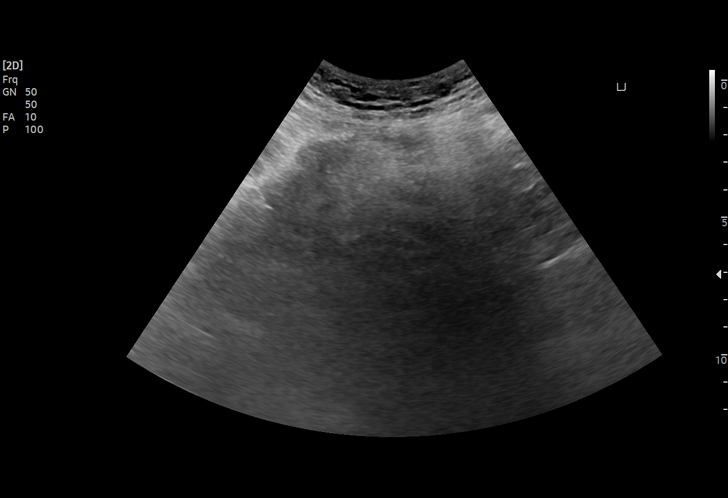

[15 of 25 positions shown; findings below may reference images not displayed]

FINDINGS: Right Kidney:

Renal measurements: 11.8 x 5.8 x 7.6 cm = volume: 272 mL. There is
no hydronephrosis or perinephric fluid collection.

Left Kidney:

Renal measurements: 9.1 x 5 x 5.8 cm = volume: 138 mL. There is no
hydronephrosis. There is no perinephric fluid collection.

Bladder:

There is no fluid in the urinary bladder limiting evaluation.

Other:

According to the note by the technologist, examination was
technically difficult due to the patient's body habitus.
IMPRESSION: There is no hydronephrosis.

## 2021-09-02 MED ORDER — LACTATED RINGERS IV SOLN: INTRAVENOUS | Status: AC

## 2021-09-02 MED ORDER — SODIUM CHLORIDE 0.9 % IV SOLN
3.0000 g | Freq: Two times a day (BID) | INTRAVENOUS | Status: DC
  Filled 2021-09-02: qty 8

## 2021-09-02 MED ORDER — SODIUM CHLORIDE 0.9 % IV SOLN
2.0000 g | INTRAVENOUS | Status: AC
  Administered 2021-09-02 – 2021-09-06 (×5): 2 g via INTRAVENOUS
  Filled 2021-09-02 (×6): qty 20

## 2021-09-02 MED ORDER — POTASSIUM CHLORIDE 20 MEQ PO PACK
20.0000 meq | PACK | Freq: Once | ORAL | Status: AC
  Administered 2021-09-02: 20 meq

## 2021-09-02 NOTE — Progress Notes (Signed)
NAME:  Lorrayne Ismael MRN:  [id number] DOB:  1956-06-08 LOS: 2 ADMISSION DATE:  08/30/2021  CONSULTATION DATE:  08/31/2021 REFERRING MD:  Dr. Inda Merlin of [facility]. Referred in ER  REASON FOR CONSULTATION:  acute hypoxemic respiratory failure   Brief History    This 66 y.o. morbidly obese African American female nonsmoker and breast cancer survivor is seen in consultation at the request of Dr. Inda Merlin for recommendations on further evaluation and management of acute hypoxemic respiratory failure and alcohol withdrawal seizures.  The patient presented to [facility] Emergency Department on 08/31/2021 via EMS with complaints of witnessed seizure activity at home.  The patient's family apparently reported that the patient is an alcoholic who has recently decided to stop dirnking cold [place].  In the ER, the patient had witnessed episodes of seizure activity that were treated with benzodiazepines (Valium, Ativan), which successfully stopped the seizure activity but was associated with recurrent apneic spells with hypoxia, prompting endotracheal intubation.  Neurology was consulted from the ER and has advised continuing with treatment of seizure activity with benzodiazepines as needed but advised against loading with antiepileptic drugs for now.  Past Medical/Surgical/Social/Family History   has a past medical history of Alcohol dependence ([place]), Aortic atherosclerosis ([place]), Arthritis, Cancer ([place]) (11/2019), Chronic heart failure ([place]), Chronic kidney disease, stage 3 ([place]), COVID-19, Diverticulitis, Family history of ovarian cancer, Heart murmur, Hypertension, Right nephrolithiasis, Thoracic aortic aneurysm, and Tubular adenoma polyp of rectum.  has a past surgical history that includes Tubal ligation; Radial head arthroplasty (Right, 06/25/2019); Multiple extractions with alveoloplasty (Bilateral, 12/14/2019); Mastectomy with axillary lymph node dissection (Left, 12/19/2019); Portacath placement  (N/A, 12/19/2019); Colonoscopy with propofol (N/A, 02/24/2021); and polypectomy (02/24/2021).  BREAST CANCER - Dr Magrinat/ Wilber Bihari [name] (pronounced "yellow") had routine screening mammography on 10/24/2019 showing a possible abnormality in the left breast. She underwent left breast ultrasonography at [facility] on 11/05/2019 showing: suspicious 3.8 cm mass in left breast at 3 o'clock; five abnormal lymph nodes. # Hx of left breast cancer pT2 pN1, stage IIB diagnosed in 2021. She is s/p mastectomy, chemotherapy and radiation.. On Anastrozole.  Marland Kitchen  #Likely cirrhosi - see [facility] - last seen may 2022 # Possible Cirrhosis demonstrated on CT scan in July 2021. Fib-4 is 2.27 ( further investigation needed). INR elevated in December. Recent platelet count 163 and albumin 3.4. History of Etoh abuse  ( in remission) and high risk for fatty liver disease  Micro Data:  08/31/21 - covid pcr - neg 08/31/21  flu pcr - neg 08/31/21 - MRSA PCR - neg  Antimicrobials:  1/2 - ceftriaxone >>>  Significant Hospital Events   1/2: presented to ER with alcohol withdrawal seizures, intubated in ER 1/3 NAEON, moves all 4 ext sometimes on command, fluids given for poor urine output as well as pulse pressure variation 1/4 creatinine rising, urine output poor, giving more fluids, pulse pressure variation persists on    Interim history/subjective:  NAEON.  Creatinine increasing.  Urine output only 150 cc in the last 24 hours although did increase to 100 cc over last 12 hours with initiation of fluids.  Renal ultrasound ordered.   Objective   BP 131/76    Pulse 96    Temp 98.4 F (36.9 C)    Resp 14    Ht 5\' 1"  (1.549 m)    Wt (!) 138 kg    SpO2 98%    BMI 57.48 kg/m  Filed Weights   08/31/21 0400 09/02/21 0500  Weight: (!) 138 kg (!) 138 kg    Intake/Output Summary (Last 24 hours) at 09/02/2021 1062 Last data filed at 09/02/2021 0400 Gross per 24 hour  Intake 1343.83 ml  Output  150 ml  Net 1193.83 ml     Vent Mode: PSV;CPAP FiO2 (%):  [30 %-40 %] 30 % Set Rate:  [14 bmp] 14 bmp Vt Set:  [380 mL] 380 mL PEEP:  [8 cmH20] 8 cmH20 Pressure Support:  [8 cmH20] 8 cmH20 Plateau Pressure:  [23 IRS85-46 cmH20] 23 cmH20   Examination: General Appearance:  obese, ill appearing Head:  Normocephalic, without obvious abnormality, atraumatic Eyes:  PERRL - yes, conjunctiva/corneas - muddy     Ears:  Normal external ear canals, both ears Throat:  ETT TUBE - yes , OG tube - YES Neck:  Supple,  No enlargement/tenderness/nodules Lungs: Clear to auscultation bilaterally, Heart:  S1 and S2 normal, no murmur, Abdomen:  Soft, no masses, no organomegaly  Genitalia / Rectal:  Not done Extremities:  Extremities- intact Neurologic:  Sedated, rass -3   Resolved Hospital Problem list      Assessment & Plan:   Acute respiratory failure due to agitated encephalopathy from alcohol withdrawal and subsequent obtundation -resulting in intubation upon admission 08/31/2021  with clinical aspiration event   --PRVC, SBT as able --RASS -1, wean fentanyl continue propofol --VAP bundle -- Unasyn 1/2-1/4, ceftriaxone 1/4-   Chronic alcoholism -likely relapse [most recently in remission May 2022)  Agitated encephalopathy with seizures due to alcohol withdrawal after quitting cold [place] --CT head normal --moves all extremities --midazolam PRN, wean fentanyl, continue propofol for concern for initial seizure --spot EEG negative 1/2  Hypotension: Resolved with fluid bolus.  Likely sedation related, intravascular volume depletion given pulse pressure variation on A-line on 8 cc/kg  History of grade 2 diastolic dysfunction on echo April 2022  -  --hold lasix 1/3, give fluids as above  Acute oliguric renal failure on CKD4 - baseline creat 1.9 -- Creatinine rising, urine output poor -- Fluids 1/3 with slightly relatively insignificant increase in urine output overnight, -- Continue TF  started 1/3 -- Additional maintenance fluids today --Renal ultrasound ordered --Bladder scans negative, Foley in place  Cirrhosis - Documented on CT abdomen July 2021. GI evaluation May 2022 did not follow-up with recommendations for right upper quadrant ultrasound Mild transamiitis and hyperbilirubinemai - suspect related to cirrhosis --RUQ [place] 1/2 with sludge no sign of choelcystitis  Chronic anemia -Baseline hemoglobin between 8.8 and 9.3 g% - elevated 11s on admission query concentration --Hgb trending down at baseline now with fluids as well as other cell lines evidence of bleeding, CTM  New onset thrombocytopenia (POA) --multifactorial, critical illness, suspect splenic sequestration due to cirrhosis  Breast cancer:  --Arimidex when able    Best practice:  Diet: TF Pain/Anxiety/Delirium protocol (if indicated): propofol and fent gtt VAP protocol (if indicated): YES DVT prophylaxis: heparin GI prophylaxis: Protonix Glucose control: N/A Mobility/Activity: bedrest   Code Status: Prior Full code  Family Communication:  call listed friend Janece Canterbury [phone] and updated 9:22 am 08/31/21  CRITICAL CARE Performed by: Bonna Gains Austyn [place]   Total critical care time: 35 minutes  Critical care time was exclusive of separately billable procedures and treating other patients.  Critical care was necessary to treat or prevent imminent or life-threatening deterioration.  Critical care was time spent personally by me on the following activities: development of treatment plan with  patient and/or surrogate as well as nursing, discussions with consultants, evaluation of patient's response to treatment, examination of patient, obtaining history from patient or surrogate, ordering and performing treatments and interventions, ordering and review of laboratory studies, ordering and review of radiographic studies, pulse oximetry and re-evaluation of patient's condition.  Lanier Clam, MD See Shea Evans for contact info

## 2021-09-02 NOTE — Progress Notes (Signed)
Pharmacy Antibiotic Note  [name] is a 66 y.o. female admitted on 08/30/2021 with alcohol withdrawal seizures and concern for aspiration pneumonia.  Pharmacy has been consulted for Unasyn dosing.  Day# 3 Unasyn Currently afebrile, WBC WNL Renal function continues to worsen- Scr inc to 2.9; only 140ml UOP in past 24h per flowsheet  Plan: Decrease Unasyn 3g IV q12h Follow up renal function, culture results, and clinical course.   Height: 5\' 1"  (154.9 cm) Weight: (!) 138 kg (304 lb 3.8 oz) IBW/kg (Calculated) : 47.8  Temp (24hrs), Avg:98.9 F (37.2 C), Min:96.8 F (36 C), Max:100.8 F (38.2 C)  Recent Labs  Lab 08/31/21 0001 09/01/21 0351 09/02/21 0308  WBC 9.9 10.8* 9.6  CREATININE 1.51* 2.10* 2.90*  LATICACIDVEN  --  0.8  --      Estimated Creatinine Clearance: 25.3 mL/min (A) (by C-G formula based on SCr of 2.9 mg/dL (H)).    Allergies  Allergen Reactions   Amlodipine Swelling    BLE edema    Antimicrobials this admission: 1/2 Ceftriaxone >>  1/2 Unasyn >>   Dose adjustments this admission: 1/4: Decrease Unasyn to q12h for worsening renal fxn  Microbiology results: 1/2 Covid neg; influenza neg 1/2 UCx:  >100K Ecoli 1/2 Sputum:   1/2 MRSA PCR: not detected  Thank you for allowing pharmacy to be a part of this patients care.  Netta Cedars PharmD, BCPS Clinical Pharmacist [facility] main pharmacy [phone] 09/02/2021 4:24 AM

## 2021-09-03 DIAGNOSIS — J9601 Acute respiratory failure with hypoxia: Secondary | ICD-10-CM | POA: Diagnosis not present

## 2021-09-03 LAB — CBC WITH DIFFERENTIAL/PLATELET
Abs Immature Granulocytes: 0.02 10*3/uL (ref 0.00–0.07)
Basophils Absolute: 0 10*3/uL (ref 0.0–0.1)
Basophils Relative: 0 %
Eosinophils Absolute: 0.3 10*3/uL (ref 0.0–0.5)
Eosinophils Relative: 5 %
HCT: 26.8 % — ABNORMAL LOW (ref 36.0–46.0)
Hemoglobin: 8.5 g/dL — ABNORMAL LOW (ref 12.0–15.0)
Immature Granulocytes: 0 %
Lymphocytes Relative: 18 %
Lymphs Abs: 1 10*3/uL (ref 0.7–4.0)
MCH: 32 pg (ref 26.0–34.0)
MCHC: 31.7 g/dL (ref 30.0–36.0)
MCV: 100.8 fL — ABNORMAL HIGH (ref 80.0–100.0)
Monocytes Absolute: 0.9 10*3/uL (ref 0.1–1.0)
Monocytes Relative: 17 %
Neutro Abs: 3.3 10*3/uL (ref 1.7–7.7)
Neutrophils Relative %: 60 %
Platelets: 100 10*3/uL — ABNORMAL LOW (ref 150–400)
RBC: 2.66 MIL/uL — ABNORMAL LOW (ref 3.87–5.11)
RDW: 13.5 % (ref 11.5–15.5)
WBC: 5.5 10*3/uL (ref 4.0–10.5)
nRBC: 0 % (ref 0.0–0.2)

## 2021-09-03 LAB — COMPREHENSIVE METABOLIC PANEL
ALT: 15 U/L (ref 0–44)
AST: 18 U/L (ref 15–41)
Albumin: 2.6 g/dL — ABNORMAL LOW (ref 3.5–5.0)
Alkaline Phosphatase: 48 U/L (ref 38–126)
Anion gap: 11 (ref 5–15)
BUN: 31 mg/dL — ABNORMAL HIGH (ref 8–23)
CO2: 25 mmol/L (ref 22–32)
Calcium: 8.3 mg/dL — ABNORMAL LOW (ref 8.9–10.3)
Chloride: 102 mmol/L (ref 98–111)
Creatinine, Ser: 2.25 mg/dL — ABNORMAL HIGH (ref 0.44–1.00)
GFR, Estimated: 23 mL/min — ABNORMAL LOW (ref >60)
Glucose, Bld: 128 mg/dL — ABNORMAL HIGH (ref 70–99)
Potassium: 3.2 mmol/L — ABNORMAL LOW (ref 3.5–5.1)
Sodium: 138 mmol/L (ref 135–145)
Total Bilirubin: 0.7 mg/dL (ref 0.3–1.2)
Total Protein: 6.2 g/dL — ABNORMAL LOW (ref 6.5–8.1)

## 2021-09-03 LAB — GLUCOSE, CAPILLARY
Glucose-Capillary: 130 mg/dL — ABNORMAL HIGH (ref 70–99)
Glucose-Capillary: 121 mg/dL — ABNORMAL HIGH (ref 70–99)
Glucose-Capillary: 128 mg/dL — ABNORMAL HIGH (ref 70–99)
Glucose-Capillary: 116 mg/dL — ABNORMAL HIGH (ref 70–99)
Glucose-Capillary: 147 mg/dL — ABNORMAL HIGH (ref 70–99)
Glucose-Capillary: 135 mg/dL — ABNORMAL HIGH (ref 70–99)

## 2021-09-03 LAB — MAGNESIUM
Magnesium: 2 mg/dL (ref 1.7–2.4)
Magnesium: 2 mg/dL (ref 1.7–2.4)

## 2021-09-03 LAB — PHOSPHORUS
Phosphorus: 4.7 mg/dL — ABNORMAL HIGH (ref 2.5–4.6)
Phosphorus: 4.1 mg/dL (ref 2.5–4.6)

## 2021-09-03 MED ORDER — HYDRALAZINE HCL 20 MG/ML IJ SOLN
10.0000 mg | INTRAMUSCULAR | Status: DC | PRN
  Administered 2021-09-04 – 2021-09-05 (×5): 10 mg via INTRAVENOUS
  Filled 2021-09-03 (×4): qty 1

## 2021-09-03 MED ORDER — LABETALOL HCL 5 MG/ML IV SOLN
10.0000 mg | INTRAVENOUS | Status: DC | PRN
  Administered 2021-09-03 – 2021-09-04 (×2): 10 mg via INTRAVENOUS
  Filled 2021-09-03 (×2): qty 4

## 2021-09-03 MED ORDER — POTASSIUM CHLORIDE 20 MEQ PO PACK
40.0000 meq | PACK | ORAL | Status: AC
  Administered 2021-09-03 (×2): 40 meq
  Filled 2021-09-03 (×2): qty 2

## 2021-09-03 MED ORDER — HEPARIN SOD (PORK) LOCK FLUSH 100 UNIT/ML IV SOLN
500.0000 [IU] | INTRAVENOUS | Status: DC | PRN
  Filled 2021-09-03: qty 5

## 2021-09-03 MED ORDER — HEPARIN SOD (PORK) LOCK FLUSH 100 UNIT/ML IV SOLN
500.0000 [IU] | INTRAVENOUS | Status: DC
  Filled 2021-09-03: qty 5

## 2021-09-03 MED ORDER — DEXMEDETOMIDINE HCL IN NACL 200 MCG/50ML IV SOLN
0.0000 ug/kg/h | INTRAVENOUS | Status: DC
  Administered 2021-09-03 (×5): 0.8 ug/kg/h via INTRAVENOUS
  Administered 2021-09-03: 0.4 ug/kg/h via INTRAVENOUS
  Administered 2021-09-03: 0.7 ug/kg/h via INTRAVENOUS
  Administered 2021-09-03 (×2): 0.8 ug/kg/h via INTRAVENOUS
  Administered 2021-09-04: 0.5 ug/kg/h via INTRAVENOUS
  Administered 2021-09-04: 0.6 ug/kg/h via INTRAVENOUS
  Administered 2021-09-04: 0.5 ug/kg/h via INTRAVENOUS
  Filled 2021-09-03 (×10): qty 50
  Filled 2021-09-03 (×2): qty 100
  Filled 2021-09-03: qty 50

## 2021-09-03 NOTE — Progress Notes (Signed)
NAME:  [name] MRN:  [id number] DOB:  1956-07-11 LOS: 3 ADMISSION DATE:  08/30/2021  CONSULTATION DATE:  08/31/2021 REFERRING MD:  Dr. Inda Merlin of [facility]. Referred in ER  REASON FOR CONSULTATION:  acute hypoxemic respiratory failure   Brief History    This 66 y.o. morbidly obese African American female nonsmoker and breast cancer survivor is seen in consultation at the request of Dr. Inda Merlin for recommendations on further evaluation and management of acute hypoxemic respiratory failure and alcohol withdrawal seizures.  The patient presented to [facility] Emergency Department on 08/31/2021 via EMS with complaints of witnessed seizure activity at home.  The patient's family apparently reported that the patient is an alcoholic who has recently decided to stop dirnking cold [place].  In the ER, the patient had witnessed episodes of seizure activity that were treated with benzodiazepines (Valium, Ativan), which successfully stopped the seizure activity but was associated with recurrent apneic spells with hypoxia, prompting endotracheal intubation.  Neurology was consulted from the ER and has advised continuing with treatment of seizure activity with benzodiazepines as needed but advised against loading with antiepileptic drugs for now.  Past Medical/Surgical/Social/Family History   has a past medical history of Alcohol dependence ([place]), Aortic atherosclerosis ([place]), Arthritis, Cancer ([place]) (11/2019), Chronic heart failure ([place]), Chronic kidney disease, stage 3 ([place]), COVID-19, Diverticulitis, Family history of ovarian cancer, Heart murmur, Hypertension, Right nephrolithiasis, Thoracic aortic aneurysm, and Tubular adenoma polyp of rectum.  has a past surgical history that includes Tubal ligation; Radial head arthroplasty (Right, 06/25/2019); Multiple extractions with alveoloplasty (Bilateral, 12/14/2019); Mastectomy with axillary lymph node dissection (Left, 12/19/2019); Portacath placement  (N/A, 12/19/2019); Colonoscopy with propofol (N/A, 02/24/2021); and polypectomy (02/24/2021).  BREAST CANCER - Dr Magrinat/ Wilber Bihari [name] (pronounced "yellow") had routine screening mammography on 10/24/2019 showing a possible abnormality in the left breast. She underwent left breast ultrasonography at [facility] on 11/05/2019 showing: suspicious 3.8 cm mass in left breast at 3 o'clock; five abnormal lymph nodes. # Hx of left breast cancer pT2 pN1, stage IIB diagnosed in 2021. She is s/p mastectomy, chemotherapy and radiation.. On Anastrozole.  Marland Kitchen  #Likely cirrhosi - see [facility] - last seen may 2022 # Possible Cirrhosis demonstrated on CT scan in July 2021. Fib-4 is 2.27 ( further investigation needed). INR elevated in December. Recent platelet count 163 and albumin 3.4. History of Etoh abuse  ( in remission) and high risk for fatty liver disease  Micro Data:  08/31/21 - covid pcr - neg 08/31/21  flu pcr - neg 08/31/21 - MRSA PCR - neg  Antimicrobials:  1/2 - ceftriaxone >>>  Significant Hospital Events   1/2: presented to ER with alcohol withdrawal seizures, intubated in ER 1/3 NAEON, moves all 4 ext per RN report, sedated at time of my eval 1/4 CR up, UOP picked up with IVF 1/5 Cr improved, follows commands, try precedex to help vent wean RSBI 110 on 5/5, RR mid 30s   Interim history/subjective:  NAEON. UOP and Cr improved. Doing ok on PSV 5/5 with RSBI ~110, RR mid 30s with Vt in 300s cc. Appears startled. Trial precedex.   Objective   BP (!) 181/79    Pulse 84    Temp 98.6 F (37 C)    Resp 19    Ht 5\' 1"  (1.549 m)    Wt (!) 138 kg    SpO2 100%    BMI 57.48 kg/m     [employer]  08/31/21 0400 09/02/21 0500  Weight: (!) 138 kg (!) 138 kg    Intake/Output Summary (Last 24 hours) at 09/03/2021 1497 Last data filed at 09/03/2021 0800 Gross per 24 hour  Intake 3706.85 ml  Output 1340 ml  Net 2366.85 ml     Vent Mode: CPAP;PSV FiO2 (%):  [30 %]  30 % Set Rate:  [14 bmp] 14 bmp Vt Set:  [380 mL] 380 mL PEEP:  [5 cmH20-8 cmH20] 5 cmH20 Pressure Support:  [5 cmH20-8 cmH20] 5 cmH20 Plateau Pressure:  [18 cmH20-22 cmH20] 18 cmH20   Examination: General Appearance:  obese, ill appearing Head:  Normocephalic, without obvious abnormality, atraumatic Eyes:  PERRL - yes, conjunctiva/corneas - muddy     Ears:  Normal external ear canals, both ears Throat:  ETT TUBE - yes , OG tube - YES Neck:  Supple,  No enlargement/tenderness/nodules Lungs: Clear to auscultation bilaterally, Heart:  S1 and S2 normal, no murmur, Abdomen:  Soft, no masses, no organomegaly  Genitalia / Rectal:  Not done Extremities:  Extremities- intact Neurologic:  follows commands in all 4 ext, rass -1   Resolved Hospital Problem list      Assessment & Plan:   Acute respiratory failure due to agitated encephalopathy from alcohol withdrawal and subsequent obtundation -resulting in intubation upon admission 08/31/2021  with clinical aspiration event   --PRVC, SBT as able --RASS -1, at goal, stop propofol start precedex 1/5 as appears anxious during SBT --VAP bundle  Chronic alcoholism -likely relapse [most recently in remission May 2022)  Agitated encephalopathy with seizures due to alcohol withdrawal after quitting cold [place] --CT head normal --moves all extremities --midazolam PRN, wean fentanyl, continue propofol for concern for initial seizure --spot EEG negative 1/2  UTI: Dirty UA with E coli on culture --CTX plan 5 days total from 1/4 (resistant to unasyn which was on prior) end 1/8  Hypotension: Resolved Likely sedation related, intravascular volume depletion given pulse pressure variation on A-line on 8 cc/kg --stop home carvedilol  History of grade 2 diastolic dysfunction on echo April 2022  -  --hold lasix 1/3  CKD4 - baseline creat 1.9 --Cr bump improved after hydration --start TF 1/3 --hold lasix 1/3, consider resuming in coming  days   Cirrhosis - Documented on CT abdomen July 2021. GI evaluation May 2022 did not follow-up with recommendations for right upper quadrant ultrasound Mild transamiitis and hyperbilirubinemai - suspect related to cirrhosis --RUQ [place] 1/2 with sludge no sign of choelcystitis  Chronic anemia -Baseline hemoglobin between 8.8 and 9.3 g% -  --Hgb stable, CTM  New onset thrombocytopenia (POA) --multifactorial, critical illness, suspect splenic sequestration due to cirrhosis  Breast cancer:  --Arimidex when able    Best practice:  Diet: TF Pain/Anxiety/Delirium protocol (if indicated): propofol and fent gtt VAP protocol (if indicated): YES DVT prophylaxis: heparin GI prophylaxis: Protonix Glucose control: N/A Mobility/Activity: bedrest   Code Status: Prior Full code  Family Communication:  call listed friend Janece Canterbury [phone] and updated 9:22 am 08/31/21  CRITICAL CARE Performed by: Bonna Gains Octave Montrose   Total critical care time: 34 minutes  Critical care time was exclusive of separately billable procedures and treating other patients.  Critical care was necessary to treat or prevent imminent or life-threatening deterioration.  Critical care was time spent personally by me on the following activities: development of treatment plan with patient and/or surrogate as well as nursing, discussions with consultants, evaluation of patient's response to treatment, examination of patient, obtaining history  from patient or surrogate, ordering and performing treatments and interventions, ordering and review of laboratory studies, ordering and review of radiographic studies, pulse oximetry and re-evaluation of patient's condition.  Lanier Clam, MD See Shea Evans for contact info

## 2021-09-03 NOTE — Progress Notes (Signed)
[place] Progress Note Patient Name: [name] DOB: 03/10/1956 MRN: [id number]   Date of Service  09/03/2021  HPI/Events of Note  Hypertension - BP = 212/97 and HR = 78. Received Hydralazine 10 mg IV at 8:18 PM.   eICU Interventions  Plan: Increase Hydralazine to 10 mg Q 4 hours PRN SBP > 180 or DBP > 110. Labetalol 10 mg IV Q 2 hours PRN SBP > 180 or DBP > 110. Hold dose for HR < 60.     Intervention Category Major Interventions: Hypertension - evaluation and management  Naraya Stoneberg Cornelia Copa 09/03/2021, 9:19 PM

## 2021-09-04 ENCOUNTER — Other Ambulatory Visit: Payer: Self-pay

## 2021-09-04 ENCOUNTER — Inpatient Hospital Stay (HOSPITAL_COMMUNITY): Payer: Medicare Other

## 2021-09-04 DIAGNOSIS — F10931 Alcohol use, unspecified with withdrawal delirium: Secondary | ICD-10-CM | POA: Diagnosis not present

## 2021-09-04 DIAGNOSIS — N179 Acute kidney failure, unspecified: Secondary | ICD-10-CM

## 2021-09-04 DIAGNOSIS — N390 Urinary tract infection, site not specified: Secondary | ICD-10-CM | POA: Diagnosis not present

## 2021-09-04 DIAGNOSIS — K7460 Unspecified cirrhosis of liver: Secondary | ICD-10-CM | POA: Diagnosis not present

## 2021-09-04 DIAGNOSIS — J9601 Acute respiratory failure with hypoxia: Secondary | ICD-10-CM | POA: Diagnosis not present

## 2021-09-04 LAB — TRIGLYCERIDES: Triglycerides: 65 mg/dL (ref <150)

## 2021-09-04 LAB — CBC WITH DIFFERENTIAL/PLATELET
Abs Immature Granulocytes: 0.04 10*3/uL (ref 0.00–0.07)
Basophils Absolute: 0 10*3/uL (ref 0.0–0.1)
Basophils Relative: 1 %
Eosinophils Absolute: 0.2 10*3/uL (ref 0.0–0.5)
Eosinophils Relative: 3 %
HCT: 28.3 % — ABNORMAL LOW (ref 36.0–46.0)
Hemoglobin: 9.2 g/dL — ABNORMAL LOW (ref 12.0–15.0)
Immature Granulocytes: 1 %
Lymphocytes Relative: 17 %
Lymphs Abs: 1 10*3/uL (ref 0.7–4.0)
MCH: 32.3 pg (ref 26.0–34.0)
MCHC: 32.5 g/dL (ref 30.0–36.0)
MCV: 99.3 fL (ref 80.0–100.0)
Monocytes Absolute: 1.2 10*3/uL — ABNORMAL HIGH (ref 0.1–1.0)
Monocytes Relative: 21 %
Neutro Abs: 3.3 10*3/uL (ref 1.7–7.7)
Neutrophils Relative %: 57 %
Platelets: 145 10*3/uL — ABNORMAL LOW (ref 150–400)
RBC: 2.85 MIL/uL — ABNORMAL LOW (ref 3.87–5.11)
RDW: 13.7 % (ref 11.5–15.5)
WBC: 5.7 10*3/uL (ref 4.0–10.5)
nRBC: 0 % (ref 0.0–0.2)

## 2021-09-04 LAB — COMPREHENSIVE METABOLIC PANEL
ALT: 15 U/L (ref 0–44)
AST: 21 U/L (ref 15–41)
Albumin: 2.8 g/dL — ABNORMAL LOW (ref 3.5–5.0)
Alkaline Phosphatase: 58 U/L (ref 38–126)
Anion gap: 9 (ref 5–15)
BUN: 38 mg/dL — ABNORMAL HIGH (ref 8–23)
CO2: 22 mmol/L (ref 22–32)
Calcium: 8.8 mg/dL — ABNORMAL LOW (ref 8.9–10.3)
Chloride: 110 mmol/L (ref 98–111)
Creatinine, Ser: 2.04 mg/dL — ABNORMAL HIGH (ref 0.44–1.00)
GFR, Estimated: 26 mL/min — ABNORMAL LOW (ref >60)
Glucose, Bld: 147 mg/dL — ABNORMAL HIGH (ref 70–99)
Potassium: 3.7 mmol/L (ref 3.5–5.1)
Sodium: 141 mmol/L (ref 135–145)
Total Bilirubin: 0.7 mg/dL (ref 0.3–1.2)
Total Protein: 7.1 g/dL (ref 6.5–8.1)

## 2021-09-04 LAB — GLUCOSE, CAPILLARY
Glucose-Capillary: 145 mg/dL — ABNORMAL HIGH (ref 70–99)
Glucose-Capillary: 139 mg/dL — ABNORMAL HIGH (ref 70–99)
Glucose-Capillary: 152 mg/dL — ABNORMAL HIGH (ref 70–99)
Glucose-Capillary: 110 mg/dL — ABNORMAL HIGH (ref 70–99)
Glucose-Capillary: 110 mg/dL — ABNORMAL HIGH (ref 70–99)

## 2021-09-04 LAB — PHOSPHORUS: Phosphorus: 3.6 mg/dL (ref 2.5–4.6)

## 2021-09-04 LAB — MAGNESIUM: Magnesium: 1.8 mg/dL (ref 1.7–2.4)

## 2021-09-04 IMAGING — DX DG CHEST 1V PORT
1 series · 1 of 1 positions shown · non-contrast
Comparison: Previous studies including the examination of
[DATE]

CLINICAL DATA: Shortness of breath

EXAM:
PORTABLE CHEST 1 VIEW

[chest ap]
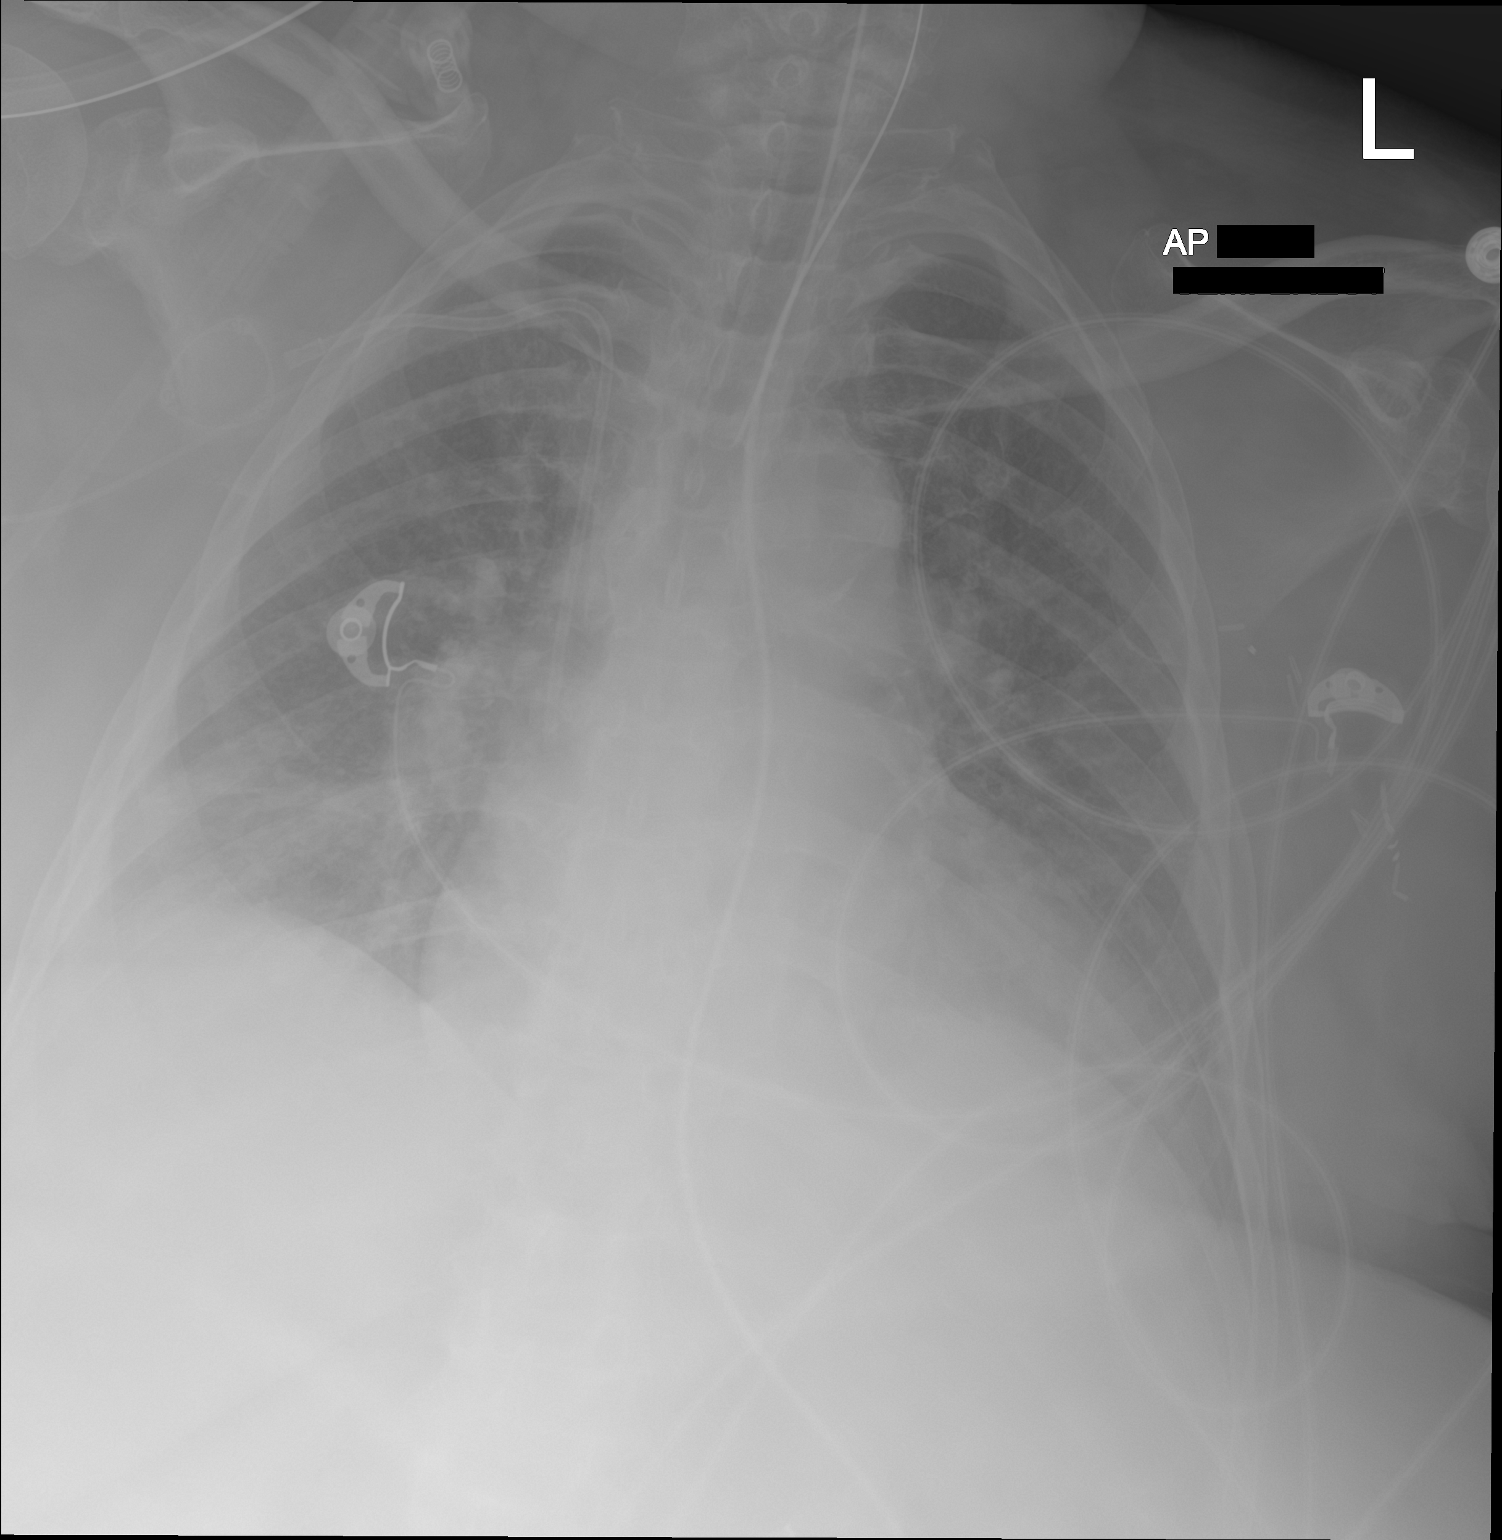

[1 of 1 positions shown; findings below may reference images not displayed]

FINDINGS: Tip of endotracheal tube is 3.7 cm above the carina. Enteric tube is
noted traversing the esophagus. Transverse diameter of heart is
increased. Central pulmonary vessels are prominent. There is
interval improvement in aeration of right lower lung fields. There
is blunting of right lateral CP angle. There is no pneumothorax. Tip
of right chest port is seen in the superior vena cava.
IMPRESSION: Cardiomegaly. Central pulmonary vessels are prominent without signs
of alveolar pulmonary edema. There is improvement in aeration of
right lower lung fields suggesting decrease in
atelectasis/pneumonia. Small right pleural effusion.

## 2021-09-04 MED ORDER — AMLODIPINE BESYLATE 5 MG PO TABS
5.0000 mg | ORAL_TABLET | Freq: Every day | ORAL | Status: DC
  Administered 2021-09-04: 5 mg via ORAL
  Filled 2021-09-04: qty 1

## 2021-09-04 MED ORDER — CARVEDILOL 12.5 MG PO TABS
12.5000 mg | ORAL_TABLET | Freq: Two times a day (BID) | ORAL | Status: DC
  Administered 2021-09-04 – 2021-09-05 (×3): 12.5 mg
  Filled 2021-09-04 (×3): qty 1

## 2021-09-04 MED ORDER — POTASSIUM CHLORIDE 20 MEQ PO PACK
40.0000 meq | PACK | Freq: Once | ORAL | Status: AC
  Administered 2021-09-04: 40 meq
  Filled 2021-09-04: qty 2

## 2021-09-04 MED ORDER — LABETALOL HCL 5 MG/ML IV SOLN
10.0000 mg | INTRAVENOUS | Status: DC | PRN
  Administered 2021-09-04 – 2021-09-06 (×13): 10 mg via INTRAVENOUS
  Filled 2021-09-04 (×12): qty 4

## 2021-09-04 MED ORDER — ORAL CARE MOUTH RINSE
15.0000 mL | Freq: Two times a day (BID) | OROMUCOSAL | Status: DC
  Administered 2021-09-04 – 2021-09-11 (×10): 15 mL via OROMUCOSAL

## 2021-09-04 MED ORDER — FENTANYL CITRATE (PF) 100 MCG/2ML IJ SOLN
25.0000 ug | INTRAMUSCULAR | Status: DC | PRN
  Administered 2021-09-05: 100 ug via INTRAVENOUS
  Administered 2021-09-05: 25 ug via INTRAVENOUS
  Administered 2021-09-05 – 2021-09-06 (×4): 100 ug via INTRAVENOUS
  Filled 2021-09-04 (×6): qty 2

## 2021-09-04 NOTE — Progress Notes (Signed)
PCCM INTERVAL PROGRESS NOTE   Patient remains hypertensive despite starting coreg and PRN anti-hypertensives. Chart lists and allergy for amlodipine, however, it seems from PCP note the reaction was bilateral lower extremity edema. This is not felt to be a true allergy. With renal dysfunction ACE-I are off the table so we will start amlodipine and monitor for any unintended effects.    Georgann Housekeeper, AGACNP-BC [facility]  See Amion for personal pager PCCM on call pager [phone] until 7pm. Please call Elink 7p-7a. [phone]  09/04/2021 6:36 PM

## 2021-09-04 NOTE — Procedures (Signed)
Extubation Procedure Note  Patient Details:   Name: Cleora Karnik DOB: 03/18/1956 MRN: [id number]   Airway Documentation:  Airway 7.5 mm (Active)  Secured at (cm) 23 cm 09/04/21 1133  Measured From Lips 09/04/21 1133  Secured Location Left 09/04/21 1133  Secured By [employer] 09/04/21 1133  Tube Holder Repositioned Yes 09/04/21 1133  Prone position No 09/04/21 1133  Cuff Pressure (cm H2O) Clear OR 27-39 CmH2O 09/04/21 0803  Site Condition Dry 09/03/21 1516   Vent end date: (not recorded) Vent end time: (not recorded)   Evaluation  O2 sats: stable throughout Complications: No apparent complications Patient did tolerate procedure well. Bilateral Breath Sounds: Diminished   Yes  Martha Clan 09/04/2021, 2:32 PM

## 2021-09-04 NOTE — Progress Notes (Signed)
[place] Progress Note Patient Name: Avianah Pellman DOB: Dec 14, 1955 MRN: [id number]   Date of Service  09/04/2021  HPI/Events of Note  Hypertension - BP = 195/65.  eICU Interventions  Plan: Increase Labetalol to 10 mg IV Q 1 hour PRN SBP > 180 or DBP > 110.     Intervention Category Major Interventions: Hypertension - evaluation and management  Denay Pleitez Eugene 09/04/2021, 4:29 AM

## 2021-09-04 NOTE — Progress Notes (Signed)
[facility] Progress Note Patient Name: [name] DOB: 02-19-1956 MRN: [id number]   Date of Service  09/04/2021  HPI/Events of Note  Pain   Hypertension - No longer on Fentanyl IV infusion.   eICU Interventions  Plan: Fentanyl 25-100 mcg IV Q 2 hours PRN pain.      Intervention Category Major Interventions: Other:;Hypertension - evaluation and management  Ahlia Lemanski Cornelia Copa 09/04/2021, 5:25 AM

## 2021-09-04 NOTE — Progress Notes (Signed)
NAME:  [name] MRN:  [id number] DOB:  1956/02/10 LOS: 4 ADMISSION DATE:  08/30/2021  CONSULTATION DATE:  08/31/2021 REFERRING MD:  Dr. Inda Merlin of [facility]. Referred in ER  REASON FOR CONSULTATION:  acute hypoxemic respiratory failure   Brief History    This 66 y.o. morbidly obese African American female nonsmoker and breast cancer survivor is seen in consultation at the request of Dr. Inda Merlin for recommendations on further evaluation and management of acute hypoxemic respiratory failure and alcohol withdrawal seizures.  The patient presented to [facility] Emergency Department on 08/31/2021 via EMS with complaints of witnessed seizure activity at home.  The patient's family apparently reported that the patient is an alcoholic who has recently decided to stop dirnking cold [place].  In the ER, the patient had witnessed episodes of seizure activity that were treated with benzodiazepines (Valium, Ativan), which successfully stopped the seizure activity but was associated with recurrent apneic spells with hypoxia, prompting endotracheal intubation.  Neurology was consulted from the ER and has advised continuing with treatment of seizure activity with benzodiazepines as needed but advised against loading with antiepileptic drugs for now.  Past Medical/Surgical/Social/Family History   has a past medical history of Alcohol dependence ([employer]), A[place] atherosclerosis ([place] Arthritis, Cancer ([place]) (11/2019), Chronic heart failure ([place]), Chronic kidney disease, stage 3 ([place]), COVID-19, Diverticulitis, Family history of ovarian cancer, Heart murmur, Hypertension, Right nephrolithiasis, Thoracic aortic aneurysm, and Tubular adenoma polyp of rectum.  has a past surgical history that includes Tubal ligation; Radial head arthroplasty (Right, 06/25/2019); Multiple extractions with alveoloplasty (Bilateral, 12/14/2019); Mastectomy with axillary lymph node dissection (Left, 12/19/2019); Portacath placement  (N/A, 12/19/2019); Colonoscopy with propofol (N/A, 02/24/2021); and polypectomy (02/24/2021).  BREAST CANCER - Dr Magrinat/ Wilber Bihari [name] (pronounced "yellow") had routine screening mammography on 10/24/2019 showing a possible abnormality in the left breast. She underwent left breast ultrasonography at [facility] on 11/05/2019 showing: suspicious 3.8 cm mass in left breast at 3 o'clock; five abnormal lymph nodes. # Hx of left breast cancer pT2 pN1, stage IIB diagnosed in 2021. She is s/p mastectomy, chemotherapy and radiation.. On Anastrozole.  Marland Kitchen  #Likely cirrhosi - see [facility] - last seen may 2022 # Possible Cirrhosis demonstrated on CT scan in July 2021. Fib-4 is 2.27 ( further investigation needed). INR elevated in December. Recent platelet count 163 and albumin 3.4. History of Etoh abuse  ( in remission) and high risk for fatty liver disease  Micro Data:  08/31/21 - covid pcr - neg 08/31/21  flu pcr - neg 08/31/21 - MRSA PCR - neg  Antimicrobials:  1/2 - ceftriaxone >>>  Significant Hospital Events   1/2: presented to ER with alcohol withdrawal seizures, intubated in ER 1/3 NAEON, moves all 4 ext per RN report, sedated at time of my eval 1/4 CR up, UOP picked up with IVF 1/5 Cr improved, follows commands, try precedex to help vent wean RSBI 110 on 5/5, RR mid 30s 1/6 low volume/high rate on SBT   Interim history/subjective:  Hypertensive overnight treated with PRNs. On PSV this morning. I turned down to 5/5 and she had low Vt and high rates mid 30s.   Objective   BP (!) 157/62    Pulse 78    Temp 99.9 F (37.7 C)    Resp (!) 33    Ht 5\' 1"  (1.549 m)    Wt (!) 138 kg    SpO2 100%    BMI 57.48 kg/m  [employer]   08/31/21 0400 09/02/21 0500  Weight: (!) 138 kg (!) 138 kg    Intake/Output Summary (Last 24 hours) at 09/04/2021 1046 Last data filed at 09/04/2021 1000 Gross per 24 hour  Intake 1955.22 ml  Output 2660 ml  Net -704.78 ml    Vent  Mode: PSV;CPAP FiO2 (%):  [30 %] 30 % Set Rate:  [14 bmp] 14 bmp Vt Set:  [380 mL] 380 mL PEEP:  [5 cmH20-8 cmH20] 8 cmH20 Pressure Support:  [10 cmH20] 10 cmH20 Plateau Pressure:  [19 cmH20-23 cmH20] 19 cmH20   General:  morbidly obese female on vent Neuro:  somnolent, but arouses and interacts appropriately, follows commands.  HEENT:  [place]/AT, No JVD noted, PERRL Cardiovascular:  RRR, no MRG Lungs:  Clear, diminished in bases.  Abdomen:  Soft, non-distended, non-tender.  Musculoskeletal:  No acute deformity or ROM limitation.  Skin:  Intact, MMM   Resolved Hospital Problem list   Hypotension: Resolved Likely sedation related, intravascular volume depletion given pulse pressure variation on A-line on 8 cc/kg   Assessment & Plan:   Acute respiratory failure due to agitated encephalopathy from alcohol withdrawal and subsequent obtundation -resulting in intubation upon admission 08/31/2021  with clinical aspiration event   --PRVC, SBT as able --RASS -1, Precedex, wean to off if able --VAP bundle -- Fails SBT due to hypoventilation, tachypnea on SBT.   Chronic alcoholism -likely relapse [most recently in remission May 2022)  Agitated encephalopathy with seizures due to alcohol withdrawal after quitting cold [place]--CT head normal--moves all extremities --spot EEG negative 1/2 -- PRN benzo -- Neurology recommended against starting AEDs -- Thiamine, folic acid -- Consider high dose thiamine if she does not become more interactive.   UTI: Dirty UA with E coli on culture --CTX plan 5 days total from 1/4 (resistant to unasyn which was on prior) end 1/8  HTN  - restart home carvedilol - PRN hydral, labetalol  - Holding home lasix as she had a creatinine bump with IV dose on 1/4  History of grade 2 diastolic dysfunction on echo April 2022  -  --hold lasix  as above  CKD4 - baseline creat 1.9 - trend BMP  Cirrhosis - Documented on CT abdomen July 2021. GI evaluation May 2022 did  not follow-up with recommendations for right upper quadrant ultrasound Mild transamiitis and hyperbilirubinemai - suspect related to cirrhosis --RUQ [place] 1/2 with sludge no sign of choelcystitis  Chronic anemia -Baseline hemoglobin between 8.8 and 9.3 g% -  --Hgb stable, CTM  New onset thrombocytopenia (POA) --multifactorial, critical illness, suspect splenic sequestration due to cirrhosis  Breast cancer:  --Arimidex when able    Best practice:  Diet: TF Pain/Anxiety/Delirium protocol (if indicated): propofol and fent gtt VAP protocol (if indicated): YES DVT prophylaxis: heparin GI prophylaxis: Protonix Glucose control: N/A Mobility/Activity: bedrest   Code Status: Prior Full code   CC time  38 minutes   Georgann Housekeeper, AGACNP-BC [facility]  See Amion for personal pager PCCM on call pager [phone] until 7pm. Please call Elink 7p-7a. [phone]  09/04/2021 10:57 AM

## 2021-09-05 DIAGNOSIS — N1831 Chronic kidney disease, stage 3a: Secondary | ICD-10-CM | POA: Diagnosis not present

## 2021-09-05 DIAGNOSIS — J9601 Acute respiratory failure with hypoxia: Secondary | ICD-10-CM | POA: Diagnosis not present

## 2021-09-05 DIAGNOSIS — F10931 Alcohol use, unspecified with withdrawal delirium: Secondary | ICD-10-CM | POA: Diagnosis not present

## 2021-09-05 DIAGNOSIS — I5032 Chronic diastolic (congestive) heart failure: Secondary | ICD-10-CM | POA: Diagnosis not present

## 2021-09-05 DIAGNOSIS — K7030 Alcoholic cirrhosis of liver without ascites: Secondary | ICD-10-CM

## 2021-09-05 LAB — COMPREHENSIVE METABOLIC PANEL
ALT: 17 U/L (ref 0–44)
AST: 19 U/L (ref 15–41)
Albumin: 3.1 g/dL — ABNORMAL LOW (ref 3.5–5.0)
Alkaline Phosphatase: 56 U/L (ref 38–126)
Anion gap: 8 (ref 5–15)
BUN: 39 mg/dL — ABNORMAL HIGH (ref 8–23)
CO2: 22 mmol/L (ref 22–32)
Calcium: 8.9 mg/dL (ref 8.9–10.3)
Chloride: 110 mmol/L (ref 98–111)
Creatinine, Ser: 1.74 mg/dL — ABNORMAL HIGH (ref 0.44–1.00)
GFR, Estimated: 32 mL/min — ABNORMAL LOW (ref >60)
Glucose, Bld: 122 mg/dL — ABNORMAL HIGH (ref 70–99)
Potassium: 3.8 mmol/L (ref 3.5–5.1)
Sodium: 140 mmol/L (ref 135–145)
Total Bilirubin: 0.9 mg/dL (ref 0.3–1.2)
Total Protein: 7.3 g/dL (ref 6.5–8.1)

## 2021-09-05 LAB — GLUCOSE, CAPILLARY
Glucose-Capillary: 107 mg/dL — ABNORMAL HIGH (ref 70–99)
Glucose-Capillary: 110 mg/dL — ABNORMAL HIGH (ref 70–99)
Glucose-Capillary: 115 mg/dL — ABNORMAL HIGH (ref 70–99)
Glucose-Capillary: 116 mg/dL — ABNORMAL HIGH (ref 70–99)
Glucose-Capillary: 123 mg/dL — ABNORMAL HIGH (ref 70–99)
Glucose-Capillary: 125 mg/dL — ABNORMAL HIGH (ref 70–99)
Glucose-Capillary: 141 mg/dL — ABNORMAL HIGH (ref 70–99)

## 2021-09-05 LAB — CBC
HCT: 30.3 % — ABNORMAL LOW (ref 36.0–46.0)
Hemoglobin: 9.7 g/dL — ABNORMAL LOW (ref 12.0–15.0)
MCH: 31.8 pg (ref 26.0–34.0)
MCHC: 32 g/dL (ref 30.0–36.0)
MCV: 99.3 fL (ref 80.0–100.0)
Platelets: 193 10*3/uL (ref 150–400)
RBC: 3.05 MIL/uL — ABNORMAL LOW (ref 3.87–5.11)
RDW: 13.9 % (ref 11.5–15.5)
WBC: 8 10*3/uL (ref 4.0–10.5)
nRBC: 0 % (ref 0.0–0.2)

## 2021-09-05 LAB — MAGNESIUM: Magnesium: 1.7 mg/dL (ref 1.7–2.4)

## 2021-09-05 LAB — PHOSPHORUS: Phosphorus: 3.7 mg/dL (ref 2.5–4.6)

## 2021-09-05 MED ORDER — MAGNESIUM SULFATE 2 GM/50ML IV SOLN
2.0000 g | Freq: Once | INTRAVENOUS | Status: AC
  Administered 2021-09-05: 2 g via INTRAVENOUS
  Filled 2021-09-05: qty 50

## 2021-09-05 MED ORDER — CARVEDILOL 12.5 MG PO TABS: 12.5000 mg | ORAL_TABLET | Freq: Two times a day (BID) | ORAL | Status: DC

## 2021-09-05 MED ORDER — HYDRALAZINE HCL 20 MG/ML IJ SOLN
20.0000 mg | INTRAMUSCULAR | Status: DC | PRN
  Administered 2021-09-05 – 2021-09-06 (×7): 20 mg via INTRAVENOUS
  Filled 2021-09-05 (×7): qty 1

## 2021-09-05 MED ORDER — CLONIDINE HCL 0.2 MG/24HR TD PTWK
0.2000 mg | MEDICATED_PATCH | TRANSDERMAL | Status: DC
  Administered 2021-09-05 – 2021-09-12 (×2): 0.2 mg via TRANSDERMAL
  Filled 2021-09-05 (×2): qty 1

## 2021-09-05 NOTE — Progress Notes (Signed)
NAME:  [name] MRN:  [id number] DOB:  1955-12-20 LOS: 5 ADMISSION DATE:  08/30/2021  CONSULTATION DATE:  08/31/2021 REFERRING MD:  Dr. Inda Merlin of [facility]. Referred in ER  REASON FOR CONSULTATION:  acute hypoxemic respiratory failure   Brief History    This 66 y.o. morbidly obese African American female nonsmoker and breast cancer survivor is seen in consultation at the request of Dr. Inda Merlin for recommendations on further evaluation and management of acute hypoxemic respiratory failure and alcohol withdrawal seizures.  The patient presented to [facility] Emergency Department on 08/31/2021 via EMS with complaints of witnessed seizure activity at home.  The patient's family apparently reported that the patient is an alcoholic who has recently decided to stop drinking cold [place].  In the ER, the patient had witnessed episodes of seizure activity that were treated with benzodiazepines (Valium, Ativan), which successfully stopped the seizure activity but was associated with recurrent apneic spells with hypoxia, prompting endotracheal intubation.  Neurology was consulted from the ER and has advised continuing with treatment of seizure activity with benzodiazepines as needed but advised against loading with antiepileptic drugs for now.  Past Medical/Surgical/Social/Family History   has a past medical history of Alcohol dependence ([place]), Aortic atherosclerosis ([place]), Arthritis, Cancer ([place]) (11/2019), Chronic heart failure ([place]), Chronic kidney disease, stage 3 ([place]), COVID-19, Diverticulitis, Family history of ovarian cancer, Heart murmur, Hypertension, Right nephrolithiasis, Thoracic aortic aneurysm, and Tubular adenoma polyp of rectum.  has a past surgical history that includes Tubal ligation; Radial head arthroplasty (Right, 06/25/2019); Multiple extractions with alveoloplasty (Bilateral, 12/14/2019); Mastectomy with axillary lymph node dissection (Left, 12/19/2019); Portacath placement  (N/A, 12/19/2019); Colonoscopy with propofol (N/A, 02/24/2021); and polypectomy (02/24/2021).  BREAST CANCER - Dr Magrinat/ Wilber Bihari [name] (pronounced "yellow") had routine screening mammography on 10/24/2019 showing a possible abnormality in the left breast. She underwent left breast ultrasonography at [facility] on 11/05/2019 showing: suspicious 3.8 cm mass in left breast at 3 o'clock; five abnormal lymph nodes. # Hx of left breast cancer pT2 pN1, stage IIB diagnosed in 2021. She is s/p mastectomy, chemotherapy and radiation.. On Anastrozole.  Marland Kitchen  #Likely cirrhosi - see [facility] - last seen may 2022 # Possible Cirrhosis demonstrated on CT scan in July 2021. Fib-4 is 2.27 ( further investigation needed). INR elevated in December. Recent platelet count 163 and albumin 3.4. History of Etoh abuse  ( in remission) and high risk for fatty liver disease  Micro Data:  08/31/21 - covid pcr - neg 08/31/21  flu pcr - neg 08/31/21 - MRSA PCR - neg  Antimicrobials:  1/2 - ceftriaxone >>>  Significant Hospital Events   1/2: presented to ER with alcohol withdrawal seizures, intubated in ER 1/3 NAEON, moves all 4 ext per RN report, sedated at time of my eval 1/4 CR up, UOP picked up with IVF 1/5 Cr improved, follows commands, try precedex to help vent wean RSBI 110 on 5/5, RR mid 30s 1/6 low volume/high rate on SBT > extubated pm 1/6   Scheduled Meds:  amLODipine  5 mg Oral Daily   carvedilol  12.5 mg Per Tube BID WC   chlorhexidine gluconate (MEDLINE KIT)  15 mL Mouth Rinse BID   Chlorhexidine Gluconate Cloth  6 each Topical Daily   feeding supplement (PROSource TF)  90 mL Per Tube BID   folic acid  1 mg Intravenous Daily   heparin injection (subcutaneous)  5,000 Units Subcutaneous Q8H   heparin lock  flush  500 Units Intracatheter Q30 days   insulin aspart  0-15 Units Subcutaneous Q4H   mouth rinse  15 mL Mouth Rinse q12n4p   nystatin   Topical TID   pantoprazole  (PROTONIX) IV  40 mg Intravenous Daily   thiamine injection  100 mg Intravenous Daily   Continuous Infusions:  cefTRIAXone (ROCEPHIN)  IV Stopped (09/04/21 1056)   dexmedetomidine (PRECEDEX) IV infusion Stopped (09/04/21 1109)   feeding supplement (VITAL AF 1.2 CAL) 1,000 mL (09/04/21 0200)   propofol (DIPRIVAN) infusion Stopped (09/03/21 0929)   PRN Meds:.albuterol, fentaNYL (SUBLIMAZE) injection, heparin lock flush **AND** heparin lock flush, hydrALAZINE, labetalol, midazolam      Interim history/subjective:  Extubated successfully, not even req 02 at this point lying flat on back Main issue has been bp control with only outpt bp med = lasix   Objective   BP (!) 167/64 (BP Location: Left Wrist)    Pulse 88    Temp 98.9 F (37.2 C) (Oral)    Resp (!) 34    Ht $R'5\' 1"'TU$  (1.549 m)    Wt (!) 138 kg    SpO2 96%    BMI 57.48 kg/m     Filed Weights   08/31/21 0400 09/02/21 0500  Weight: (!) 138 kg (!) 138 kg    Intake/Output Summary (Last 24 hours) at 09/05/2021 0853 Last data filed at 09/05/2021 4098 Gross per 24 hour  Intake 540.18 ml  Output 1600 ml  Net -1059.82 ml    Vent Mode: PSV;CPAP Set Rate:  [30 bmp] 30 bmp PEEP:  [5 cmH20] 5 cmH20 Pressure Support:  [8 cmH20] 8 cmH20   Tmax:   99.5  General appearance:    elderly bm comfortable supine   At Rest 02 sats  96% on RA  No jvd Oropharynx clear,  mucosa nl Neck supple Lungs with a few scattered exp > insp rhonchi bilaterally RRR no s3 or or sign murmur Abd obese with limited excursion  Extr warm with no edema or clubbing noted Neuro  Sensorium intact,  no apparent motor deficits    Resolved Hospital Problem list   Hypotension: Resolved Likely sedation related, intravascular volume depletion given pulse pressure variation on A-line on 8 cc/kg   Assessment & Plan:   Acute respiratory failure due to agitated encephalopathy from alcohol withdrawal and subsequent obtundation -resulting in intubation upon admission  08/31/2021  with clinical aspiration event   - resolved, now on RA >>> IS/ mobilization as tol   Chronic alcoholism -likely relapse [most recently in remission May 2022)  Agitated encephalopathy with seizures due to alcohol withdrawal after quitting cold [place]- - main issue is hbp which was not a problem prior to w/d from alcohol >>> rx of choice here is clonidine, will start patch since not cleared yet for po's  UTI: Dirty UA with E coli on culture --CTX plan 5 days total from 1/4 (resistant to unasyn which was on prior) end 1/8  HTN  >> clonidine patch added   History of grade 2 diastolic dysfunction on echo April 2022  -  --hold lasix  as above  CKD4 - baseline creat 1.9 Lab Results  Component Value Date   CREATININE 1.74 (H) 09/05/2021   CREATININE 2.04 (H) 09/04/2021   CREATININE 2.25 (H) 09/03/2021  - back to baseline >>> avoid nephrotoxins  Cirrhosis - Documented on CT abdomen July 2021. GI evaluation May 2022 did not follow-up with recommendations for right upper quadrant ultrasound  Mild transamiitis  and hyperbilirubinemai - suspect related to cirrhosis/etohism --RUQ [place] 1/2 with sludge no sign of choelcystitis  Chronic anemia -Baseline hemoglobin between 8.8 and 9.3 g% -  --Hgb stable, CTM  New onset thrombocytopenia (POA) Lab Results  Component Value Date   PLT 193 09/05/2021   PLT 145 (L) 09/04/2021   PLT 100 (L) 09/03/2021   PLT 236 10/16/2020   PLT 352 03/25/2020   PLT 268 01/02/2020    --multifactorial, critical illness, suspect splenic sequestration due to cirrhosis - resolved as of 1/7   Breast cancer:  --Arimidex when able    Best practice:  Diet: ST to eval then advance as tol Pain/Anxiety/Delirium protocol (if indicated): clonidine plus prn  VAP protocol (if indicated):n/a DVT prophylaxis: heparin GI prophylaxis: Protonix Glucose control: N/A Mobility/Activity: bedrest   Code Status: Full Code Full code   The patient is critically ill  with multiple organ systems failure and requires high complexity decision making for assessment and support, frequent evaluation and titration of therapies, application of advanced monitoring technologies and extensive interpretation of multiple databases. Critical Care Time devoted to patient care services described in this note is 35  minutes.   Christinia Gully, MD Pulmonary and [place] [phone]   After 7:00 pm call Elink  [phone]

## 2021-09-05 NOTE — Evaluation (Signed)
Clinical/Bedside Swallow Evaluation Patient Details  Name: [name] MRN: [id number] Date of Birth: [birth year]  Today's Date: 09/05/2021 Time: SLP Start Time (ACUTE ONLY): 1715 SLP Stop Time (ACUTE ONLY): 1750 SLP Time Calculation (min) (ACUTE ONLY): 35 min  Past Medical History:  Past Medical History:  Diagnosis Date   Alcohol dependence ([place])    Aortic atherosclerosis ([place])    Arthritis    Cancer ([place]) 11/2019   Left breast   Chronic heart failure ([facility])    Chronic kidney disease, stage 3 ([place])    COVID-19    Diverticulitis    Family history of ovarian cancer    Heart murmur    11/26/19 echo: Mild AS. AV mean gradient 12.0 mmHg; however, LVOT gradient 8 mmHg with intracvitary gradient-significant AS is not suspected   Hypertension    Right nephrolithiasis    Thoracic aortic aneurysm    Tubular adenoma polyp of rectum    Past Surgical History:  Past Surgical History:  Procedure Laterality Date   COLONOSCOPY WITH PROPOFOL N/A 02/24/2021   Procedure: COLONOSCOPY WITH PROPOFOL;  Surgeon: Yetta Flock, MD;  Location: [facility];  Service: Gastroenterology;  Laterality: N/A;   MASTECTOMY WITH AXILLARY LYMPH NODE DISSECTION Left 12/19/2019   Procedure: LEFT MASTECTOMY WITH AXILLARY LYMPH NODE DISSECTION;  Surgeon: [name], MD;  Location: [facility];  Service: General;  Laterality: Left;   MULTIPLE EXTRACTIONS WITH ALVEOLOPLASTY Bilateral 12/14/2019   Procedure: MULTIPLE EXTRACTION WITH ALVEOLOPLASTY;  Surgeon: [name], DDS;  Location: [place];  Service: Oral Surgery;  Laterality: Bilateral;   POLYPECTOMY  02/24/2021   Procedure: POLYPECTOMY;  Surgeon: Yetta Flock, MD;  Location: [facility];  Service: Gastroenterology;;   Sol Passer PLACEMENT N/A 12/19/2019   Procedure: INSERTION PORT-A-CATH WITH ULTRASOUND GUIDANCE;  Surgeon: [name], MD;  Location: [facility];  Service: General;  Laterality: N/A;   RADIAL HEAD ARTHROPLASTY Right 06/25/2019    Procedure: RIGHT RADIAL HEAD ARTHROPLASTY;  Surgeon: [name], MD;  Location: [facility];  Service: Orthopedics;  Laterality: Right;   TUBAL LIGATION     HPI:  Pt admitted from home by Ambulance with delirium and acute hypoxic respiratory failure associated with ETOH withdrawal.  Pt required intubation 1/2 and was extubated 09/04/21 pm.  Pt with hx of ETOH abuse, CHF, morbid obesity, CKD Stage 3, thoracic aortic aneurysm, and breast CA s/p L mastectomy.   Swallow evaluation order received. Pt denies dysphagia, voice is hoarse - but improving per pt and RN.  CXR 09/04/21 . "There is improvement in aeration of  right lower lung fields suggesting decrease in  atelectasis/pneumonia. Small right pleural effusion."    Assessment / Plan / Recommendation  Clinical Impression  Currently pt did not pass 3 ounce [place] water challenge due to cough within 5 seconds of sequential swallows, concerning for potential silent aspiration.  Suspect potential mild reversible dysphagia related to intubation x5 days with additional factor of increased work of breathing with effort resulting in RR in the low 30's.  No indication of aspiration *ie coughing* with single ice chips, nectar juice, applesauce nor immediately post-swallow of moist cracker. Pt is edentulous stating "they pulled my teeth when I came to the hospital".  Positioning is suboptimal due to pt's back pain despite reverse trendelenburg and head neutral position faciliated by pillow.  Fortunately pt has a strong productive cough - which faciliates her airway protection.  Due to above factors, recommend dys3/nectar diet - allowing ice chips any time and thin water between  meals.  SLP will follow up on 09/07/2021 to determine readiness/appropriateness for dietary advancement -anticipate rapid resolution of potential mild dypshagia.   Using teach back, educated pt to findings/recommendations and compensations - imploring proper positioning for po intake. SLP Visit Diagnosis:  Dysphagia, unspecified (R13.10);Dysphagia, pharyngeal phase (R13.13)    Aspiration Risk  Mild aspiration risk    Diet Recommendation Dysphagia 3 (Mech soft);Nectar-thick liquid;Free water protocol after oral care   Liquid Administration via: Cup;Straw Medication Administration: Whole meds with puree Supervision: Patient able to self feed;Full supervision/cueing for compensatory strategies Compensations: Slow rate;Small sips/bites Postural Changes: Remain upright for at least 30 minutes after po intake;Seated upright at 90 degrees    Other  Recommendations Oral Care Recommendations: Oral care BID Other Recommendations: Order thickener from pharmacy    Recommendations for follow up therapy are one component of a multi-disciplinary discharge planning process, led by the attending physician.  Recommendations may be updated based on patient status, additional functional criteria and insurance authorization.  Follow up Recommendations No SLP follow up      Assistance Recommended at Discharge Frequent or constant Supervision/Assistance  Functional Status Assessment Patient has had a recent decline in their functional status and demonstrates the ability to make significant improvements in function in a reasonable and predictable amount of time.  Frequency and Duration min 2x/week  1 week       Prognosis Prognosis for Safe Diet Advancement: Good      Swallow Study   General HPI: Pt admitted from home by Ambulance with delirium and acute hypoxic respiratory failure associated with ETOH withdrawal.  Pt required intubation 1/2 and was extubated 09/04/21 pm.  Pt with hx of ETOH abuse, CHF, morbid obesity, CKD Stage 3, thoracic aortic aneurysm, and breast CA s/p L mastectomy.   Swallow evaluation order received. Pt denies dysphagia, voice is hoarse - but improving per pt and RN.  CXR 09/04/21 . "There is improvement in aeration of  right lower lung fields suggesting decrease in   atelectasis/pneumonia. Small right pleural effusion." Type of Study: Bedside Swallow Evaluation Previous Swallow Assessment: none in epic Diet Prior to this Study: NPO Temperature Spikes Noted: No Respiratory Status: Room air History of Recent Intubation: Yes Length of Intubations (days): 5 days Date extubated: 09/04/21 Behavior/Cognition: Alert;Cooperative Oral Cavity Assessment: Excessive secretions;Other (comment) (white secretions on hard palate, removed with oral care kit) Oral Care Completed by SLP: Yes Oral Cavity - Dentition: Edentulous Vision: Functional for self-feeding Self-Feeding Abilities: Needs assist Patient Positioning: Postural control interferes with function;Other (comment) (pt as upright as she can tolerate due to her back pain, head positioned neutral for optimal positioning) Baseline Vocal Quality: Hoarse Volitional Cough: Strong;Other (Comment) (productive) Volitional Swallow: Able to elicit    Oral/Motor/Sensory Function Overall Oral Motor/Sensory Function: Other (comment) (minimal lingual deviation to the left upon protrusion, ? increased facial fullness on left)   Ice Chips Ice chips: Within functional limits Presentation: Spoon   Thin Liquid Thin Liquid: Impaired Pharyngeal  Phase Impairments: Cough - Immediate Other Comments: 3 ounce [place] completed resulting in pt coughing within 5 seconds of sequential swallows, cough observed with 2/3 tsp boluses of thin, no cough with small single straw boluses    Nectar Thick Nectar Thick Liquid: Within functional limits Presentation: Straw;Spoon   Honey Thick Honey Thick Liquid: Not tested   Puree Puree: Within functional limits Presentation: Spoon   Solid     Other Comments: delayed cough x1 - did not appear directly coorelated to intake ; mastication  adequate without oral retention      Macario Golds 09/05/2021,6:56 PM  Kathleen Lime, MS [facility] SLP [place] Office [phone] Cell  [phone]

## 2021-09-05 NOTE — Evaluation (Signed)
Physical Therapy Evaluation Patient Details Name: [name] MRN: [id number] DOB: April 06, 1956 Today's Date: 09/05/2021  History of Present Illness  Pt admitted from home by Ambulance 2* seires with delirium and acute hypoxic respiratory failure associated with ETOH withdrawal.  Pt required intubation and was extubated 09/04/21.  Pt with hx of ETOH abuse, CHF, morbid obesity, CKD Stage 3, thoracic aortic aneurysm, and breast CA s/p L mastectomy  Clinical Impression  Pt admitted as above and presenting with functional mobility limitations 2* generalized weakness, balance deficits, morbid obesity, poor endurance and c/o back pain.  Pt currently requiring significant assist of 2 or use of lift equipment for safe performance of basic mobility tasks and would benefit from follow up rehab at SNF level to maximize IND and safety prior to dc home with limited assist.     Recommendations for follow up therapy are one component of a multi-disciplinary discharge planning process, led by the attending physician.  Recommendations may be updated based on patient status, additional functional criteria and insurance authorization.  Follow Up Recommendations Skilled nursing-short term rehab (<3 hours/day)    Assistance Recommended at Discharge Frequent or constant Supervision/Assistance  Patient can return home with the following       Equipment Recommendations None recommended by PT  Recommendations for Other Services  OT consult    Functional Status Assessment       Precautions / Restrictions Precautions Precautions: Fall Restrictions Weight Bearing Restrictions: No      Mobility  Bed Mobility Overal bed mobility: Needs Assistance Bed Mobility: Rolling;Sidelying to Sit;Sit to Supine Rolling: Mod assist;+2 for physical assistance Sidelying to sit: Mod assist;+2 for physical assistance;+2 for safety/equipment   Sit to supine: Max assist;+2 for physical assistance   General bed mobility  comments: Increased time and assist 2* body habitus and c/o back pain    Transfers                   General transfer comment: Not tested 2* pt instability, morbid obesity and poor activity tolerance in EOB sitting    Ambulation/Gait                  Stairs            Wheelchair Mobility    Modified Rankin (Stroke Patients Only)       Balance Overall balance assessment: Needs assistance Sitting-balance support: Feet supported;Bilateral upper extremity supported Sitting balance-Leahy Scale: Poor                                       Pertinent Vitals/Pain Pain Assessment: Faces Faces Pain Scale: Hurts even more Pain Location: back Pain Descriptors / Indicators: Grimacing;Guarding Pain Intervention(s): Limited activity within patient's tolerance;Monitored during session;Repositioned    Home Living Family/patient expects to be discharged to:: Private residence Living Arrangements: Alone Available Help at Discharge: Family Type of Home: Apartment Home Access: Level entry       Home Layout: One level        Prior Function Prior Level of Function : Independent/Modified Independent                     Hand Dominance        Extremity/Trunk Assessment   Upper Extremity Assessment Upper Extremity Assessment: Generalized weakness    Lower Extremity Assessment Lower Extremity Assessment: Generalized weakness       Communication  Communication: Other (comment) (Pt extubated and having difficulty speaking)  Cognition Arousal/Alertness: Awake/alert Behavior During Therapy: Flat affect Overall Cognitive Status: Within Functional Limits for tasks assessed                                          General Comments      Exercises     Assessment/Plan    PT Assessment Patient needs continued PT services  PT Problem List Decreased strength;Decreased activity tolerance;Decreased balance;Decreased  mobility;Decreased knowledge of use of DME;Pain;Obesity       PT Treatment Interventions DME instruction;Gait training;Functional mobility training;Therapeutic activities;Therapeutic exercise;Patient/family education    PT Goals (Current goals can be found in the Care Plan section)  Acute Rehab PT Goals Patient Stated Goal: Regain IND PT Goal Formulation: With patient Time For Goal Achievement: 09/19/21 Potential to Achieve Goals: Fair    Frequency Min 3X/week     Co-evaluation               AM-PAC PT "6 Clicks" Mobility  Outcome Measure Help needed turning from your back to your side while in a flat bed without using bedrails?: A Lot Help needed moving from lying on your back to sitting on the side of a flat bed without using bedrails?: A Lot Help needed moving to and from a bed to a chair (including a wheelchair)?: Total Help needed standing up from a chair using your arms (e.g., wheelchair or bedside chair)?: Total Help needed to walk in hospital room?: Total Help needed climbing 3-5 steps with a railing? : Total 6 Click Score: 8    End of Session Equipment Utilized During Treatment: Oxygen Activity Tolerance: Patient limited by fatigue;Patient limited by pain Patient left: in bed;with call bell/phone within reach;with nursing/sitter in room Nurse Communication: Mobility status;Need for lift equipment PT Visit Diagnosis: Difficulty in walking, not elsewhere classified (R26.2);Muscle weakness (generalized) (M62.81);Pain    Time: 1100-1119 PT Time Calculation (min) (ACUTE ONLY): 19 min   Charges:   PT Evaluation $PT Eval Low Complexity: 1 Low          [place] Pager [phone] Office [phone]   Josealfredo Adkins 09/05/2021, 1:43 PM

## 2021-09-05 NOTE — Progress Notes (Signed)
Patient had 10 beat run of SVT, e-link made aware, will pull morning labs to check electrolytes.

## 2021-09-05 NOTE — Progress Notes (Signed)
Blood pressures continue to be outside of parameters despite multiple PRN doses throughout the night. E-link made aware, current blood pressure 193/68 (MAP 110).

## 2021-09-05 NOTE — Progress Notes (Signed)
eLink Physician-Brief Progress Note Patient Name: [name] DOB: 11/14/1955 MRN: [id number]   Date of Service  09/05/2021  HPI/Events of Note  Patient with sub-optimal BP control.  eICU Interventions  PRN Hydralazine dose increased to 20 mg from 10 mg.        Kerry Kass Lyell Clugston 09/05/2021, 4:34 AM

## 2021-09-05 NOTE — Progress Notes (Signed)
[facility] ADULT ICU REPLACEMENT PROTOCOL   The patient does apply for the [facility] Adult ICU Electrolyte Replacment Protocol based on the criteria listed below:   1.Exclusion criteria: TCTS patients, ECMO patients, and Dialysis patients 2. Is GFR >/= 30 ml/min? Yes.    Patient's GFR today is 32 3. Is SCr </= 2? Yes.   Patient's SCr is 1.74 mg/dL 4. Did SCr increase >/= 0.5 in 24 hours? No. 5.Pt's weight >40kg  Yes.   6. Abnormal electrolyte(s): mag 1.7  7. Electrolytes replaced per protocol 8.  Call MD STAT for K+ </= 2.5, Phos </= 1, or Mag </= 1 Physician:  n/a  [name] 09/05/2021 4:24 AM

## 2021-09-06 DIAGNOSIS — J9601 Acute respiratory failure with hypoxia: Secondary | ICD-10-CM | POA: Diagnosis not present

## 2021-09-06 DIAGNOSIS — F10931 Alcohol use, unspecified with withdrawal delirium: Secondary | ICD-10-CM | POA: Diagnosis not present

## 2021-09-06 DIAGNOSIS — R569 Unspecified convulsions: Secondary | ICD-10-CM | POA: Diagnosis not present

## 2021-09-06 LAB — GLUCOSE, CAPILLARY
Glucose-Capillary: 117 mg/dL — ABNORMAL HIGH (ref 70–99)
Glucose-Capillary: 124 mg/dL — ABNORMAL HIGH (ref 70–99)
Glucose-Capillary: 111 mg/dL — ABNORMAL HIGH (ref 70–99)
Glucose-Capillary: 123 mg/dL — ABNORMAL HIGH (ref 70–99)
Glucose-Capillary: 124 mg/dL — ABNORMAL HIGH (ref 70–99)
Glucose-Capillary: 123 mg/dL — ABNORMAL HIGH (ref 70–99)

## 2021-09-06 MED ORDER — AMLODIPINE BESYLATE 10 MG PO TABS
10.0000 mg | ORAL_TABLET | Freq: Once | ORAL | Status: AC
  Administered 2021-09-06: 10 mg via ORAL
  Filled 2021-09-06: qty 1

## 2021-09-06 MED ORDER — AMLODIPINE BESYLATE 10 MG PO TABS
10.0000 mg | ORAL_TABLET | Freq: Every day | ORAL | Status: DC
  Administered 2021-09-06 – 2021-09-12 (×7): 10 mg via ORAL
  Filled 2021-09-06 (×7): qty 1

## 2021-09-06 MED ORDER — SODIUM CHLORIDE 0.9% FLUSH: 10.0000 mL | INTRAVENOUS | Status: DC | PRN

## 2021-09-06 MED ORDER — CARVEDILOL 25 MG PO TABS
25.0000 mg | ORAL_TABLET | Freq: Two times a day (BID) | ORAL | Status: DC
  Administered 2021-09-06 – 2021-09-12 (×12): 25 mg via ORAL
  Filled 2021-09-06 (×8): qty 1
  Filled 2021-09-06: qty 2
  Filled 2021-09-06 (×4): qty 1

## 2021-09-06 MED ORDER — SODIUM CHLORIDE 0.9% FLUSH
10.0000 mL | Freq: Two times a day (BID) | INTRAVENOUS | Status: DC
  Administered 2021-09-06: 10 mL
  Administered 2021-09-07: 40 mL
  Administered 2021-09-07 – 2021-09-11 (×8): 10 mL

## 2021-09-06 MED ORDER — OXYCODONE HCL 5 MG PO TABS
5.0000 mg | ORAL_TABLET | ORAL | Status: DC | PRN
  Administered 2021-09-06 – 2021-09-11 (×9): 5 mg via ORAL
  Filled 2021-09-06 (×9): qty 1

## 2021-09-06 MED ORDER — CARVEDILOL 12.5 MG PO TABS
25.0000 mg | ORAL_TABLET | Freq: Once | ORAL | Status: AC
  Administered 2021-09-06: 25 mg via ORAL
  Filled 2021-09-06: qty 2

## 2021-09-06 MED ORDER — AMLODIPINE BESYLATE 5 MG PO TABS: 5.0000 mg | ORAL_TABLET | Freq: Once | ORAL | Status: DC

## 2021-09-06 NOTE — Progress Notes (Signed)
NAME:  [name] MRN:  [id number] DOB:  10-27-1955 LOS: 6 ADMISSION DATE:  08/30/2021  CONSULTATION DATE:  08/31/2021 REFERRING MD:  Dr. Inda Merlin of [facility]. Referred in ER  REASON FOR CONSULTATION:  acute hypoxemic respiratory failure   Brief History    This 66 y.o. morbidly obese African American female nonsmoker and breast cancer survivor is seen in consultation at the request of Dr. Inda Merlin for recommendations on further evaluation and management of acute hypoxemic respiratory failure and alcohol withdrawal seizures.  The patient presented to [facility] Emergency Department on 08/31/2021 via EMS with complaints of witnessed seizure activity at home.  The patient's family apparently reported that the patient is an alcoholic who has recently decided to stop drinking cold [place].  In the ER, the patient had witnessed episodes of seizure activity that were treated with benzodiazepines (Valium, Ativan), which successfully stopped the seizure activity but was associated with recurrent apneic spells with hypoxia, prompting endotracheal intubation.  Neurology was consulted from the ER and has advised continuing with treatment of seizure activity with benzodiazepines as needed but advised against loading with antiepileptic drugs for now.  Past Medical/Surgical/Social/Family History   has a past medical history of Alcohol dependence ([place]), Aortic atherosclerosis ([place]), Arthritis, Cancer ([place]) (11/2019), Chronic heart failure ([place]), Chronic kidney disease, stage 3 ([place]), COVID-19, Diverticulitis, Family history of ovarian cancer, Heart murmur, Hypertension, Right nephrolithiasis, Thoracic aortic aneurysm, and Tubular adenoma polyp of rectum.  has a past surgical history that includes Tubal ligation; Radial head arthroplasty (Right, 06/25/2019); Multiple extractions with alveoloplasty (Bilateral, 12/14/2019); Mastectomy with axillary lymph node dissection (Left, 12/19/2019); Portacath placement  (N/A, 12/19/2019); Colonoscopy with propofol (N/A, 02/24/2021); and polypectomy (02/24/2021).  BREAST CANCER - Dr Magrinat/ Wilber Bihari [name] (pronounced "yellow") had routine screening mammography on 10/24/2019 showing a possible abnormality in the left breast. She underwent left breast ultrasonography at [facility] on 11/05/2019 showing: suspicious 3.8 cm mass in left breast at 3 o'clock; five abnormal lymph nodes. # Hx of left breast cancer pT2 pN1, stage IIB diagnosed in 2021. She is s/p mastectomy, chemotherapy and radiation.. On Anastrozole.  Marland Kitchen  #Likely cirrhosis - see [facility] - last seen may 2022 # Possible Cirrhosis demonstrated on CT scan in July 2021. Fib-4 is 2.27 ( further investigation needed). INR elevated in December. Recent platelet count 163 and albumin 3.4. History of Etoh abuse  ( in remission) and high risk for fatty liver disease  Micro Data:  08/31/21 - covid pcr - neg 08/31/21  flu pcr - neg 08/31/21 - MRSA PCR - neg  Antimicrobials:  1/2 - ceftriaxone >>>  Significant Hospital Events   1/2: presented to ER with alcohol withdrawal seizures, intubated in ER 1/3 NAEON, moves all 4 ext per RN report, sedated at time of my eval 1/4 CR up, UOP picked up with IVF 1/5 Cr improved, follows commands, try precedex to help vent wean RSBI 110 on 5/5, RR mid 30s 1/6 low volume/high rate on SBT > extubated pm 1/6  1/7 Speech recs Dysphagia 3 (Mech soft);Nectar-thick liquid;Free water protocol after oral care     Scheduled Meds:  amLODipine  10 mg Oral Daily   carvedilol  25 mg Oral BID WC   chlorhexidine gluconate (MEDLINE KIT)  15 mL Mouth Rinse BID   Chlorhexidine Gluconate Cloth  6 each Topical Daily   cloNIDine  0.2 mg Transdermal Weekly   feeding supplement (PROSource TF)  90 mL Per Tube BID  folic acid  1 mg Intravenous Daily   heparin injection (subcutaneous)  5,000 Units Subcutaneous Q8H   heparin lock flush  500 Units Intracatheter Q30 days    insulin aspart  0-15 Units Subcutaneous Q4H   mouth rinse  15 mL Mouth Rinse q12n4p   nystatin   Topical TID   pantoprazole (PROTONIX) IV  40 mg Intravenous Daily   thiamine injection  100 mg Intravenous Daily   Continuous Infusions:  feeding supplement (VITAL AF 1.2 CAL) 1,000 mL (09/04/21 0200)   PRN Meds:.albuterol, fentaNYL (SUBLIMAZE) injection, heparin lock flush **AND** heparin lock flush, hydrALAZINE, labetalol, midazolam      Interim history/subjective:  BP problematic overnight  Wants to go home   Objective   BP (!) 169/83    Pulse 95    Temp 98.6 F (37 C) (Oral)    Resp (!) 26    Ht '5\' 1"'  (1.549 m)    Wt (!) 148.8 kg    SpO2 96%    BMI 61.98 kg/m     Filed Weights   08/31/21 0400 09/02/21 0500 09/06/21 0500  Weight: (!) 138 kg (!) 138 kg (!) 148.8 kg    Intake/Output Summary (Last 24 hours) at 09/06/2021 1110 Last data filed at 09/06/2021 0800 Gross per 24 hour  Intake 458.09 ml  Output 950 ml  Net -491.91 ml        Tmax:    100.2 General appearance:    obese bf lying immobile in bed   At Rest 02 sats  97% on RA  No jvd Oropharynx clear,  mucosa nl Neck supple Lungs with  distant bs  bilaterally RRR no s3 or or sign murmur Abd obese with very limited  excursion  Extr warm with no edema or clubbing noted Neuro  Sensorium intact,  no apparent motor deficits    Resolved Hospital Problem list   Hypotension: Resolved Likely sedation related, intravascular volume depletion given pulse pressure variation on A-line on 8 cc/kg   Assessment & Plan:   Acute respiratory failure due to agitated encephalopathy from alcohol withdrawal and subsequent obtundation -resulting in intubation upon admission 08/31/2021  with clinical aspiration event   - resolved, now on RA >>> IS/ mobilization as tol  - it does not appear to me she's anywhere near ready for d/c but can go to floor and proceed with PT/OT eval   Chronic alcoholism -likely relapse [most recently in  remission May 2022)  Agitated encephalopathy with seizures due to alcohol withdrawal after quitting cold [place]- - main issue is hbp which was not a problem prior to w/d from alcohol >>> continue clonidine patch plus amolodipine/ coreg now that taking po  UTI: Dirty UA with E coli on culture --CTX plan 5 days total from 1/4 (resistant to unasyn which was on prior) end 1/8  HTN  >> clonidine patch added 1/7  History of grade 2 diastolic dysfunction on echo April 2022  -  --hold lasix  as above  CKD4 - baseline creat 1.9 Lab Results  Component Value Date   CREATININE 1.74 (H) 09/05/2021   CREATININE 2.04 (H) 09/04/2021   CREATININE 2.25 (H) 09/03/2021  - back to baseline >>> avoid nephrotoxins  Cirrhosis - Documented on CT abdomen July 2021. GI evaluation May 2022 did not follow-up with recommendations for right upper quadrant ultrasound  Mild transamiitis and hyperbilirubinemai - suspect related to cirrhosis/etohism --RUQ [place] 1/2 with sludge no sign of choelcystitis  Chronic anemia -Baseline hemoglobin between 8.8  and 9.3 g% -  --Hgb stable, CTM  New onset thrombocytopenia (POA) Lab Results  Component Value Date   PLT 193 09/05/2021   PLT 145 (L) 09/04/2021   PLT 100 (L) 09/03/2021   PLT 236 10/16/2020   PLT 352 03/25/2020   PLT 268 01/02/2020    --multifactorial, critical illness, suspect splenic sequestration due to cirrhosis - resolved as of 1/7   Breast cancer:  --Arimidex when able    Best practice:  Diet: ST to eval then advance as tol Pain/Anxiety/Delirium protocol (if indicated): clonidine plus prn  VAP protocol (if indicated):n/a DVT prophylaxis: heparin GI prophylaxis: Protonix Glucose control: N/A Mobility/Activity: bedrest   Code Status: Full Code Full code    Christinia Gully, MD Pulmonary and [place] [facility] [phone]   After 7:00 pm call Elink  [phone]

## 2021-09-06 NOTE — Progress Notes (Signed)
eLink Physician-Brief Progress Note Patient Name: [name] DOB: 1955-11-09 MRN: [id number]   Date of Service  09/06/2021  HPI/Events of Note  Elevated bp.  asymptomatic  eICU Interventions  Amlodipine dose increased to 10 mg and to be given now. Coreg increased to 25 mg bid and dose to be given now     Intervention Category Major Interventions: Hypertension - evaluation and management  Mauri [place], P 09/06/2021, 4:04 AM

## 2021-09-06 NOTE — Progress Notes (Signed)
Report called for pt transfer. Prior to transport, pt c/o 10/10 sharp L arm pain. Denies chest pain. Pain at rest and with movement. No noticeable swelling. MD Wert notified. EKG obtained. MD notified of completion. Pt transfer orders for telemetry placed. PRN oxycodone order and given. Will continue to monitor.

## 2021-09-07 DIAGNOSIS — K7010 Alcoholic hepatitis without ascites: Secondary | ICD-10-CM

## 2021-09-07 DIAGNOSIS — D696 Thrombocytopenia, unspecified: Secondary | ICD-10-CM

## 2021-09-07 LAB — CBC
HCT: 28.3 % — ABNORMAL LOW (ref 36.0–46.0)
Hemoglobin: 9.1 g/dL — ABNORMAL LOW (ref 12.0–15.0)
MCH: 31.9 pg (ref 26.0–34.0)
MCHC: 32.2 g/dL (ref 30.0–36.0)
MCV: 99.3 fL (ref 80.0–100.0)
Platelets: 287 10*3/uL (ref 150–400)
RBC: 2.85 MIL/uL — ABNORMAL LOW (ref 3.87–5.11)
RDW: 14 % (ref 11.5–15.5)
WBC: 10.1 10*3/uL (ref 4.0–10.5)
nRBC: 0 % (ref 0.0–0.2)

## 2021-09-07 LAB — BASIC METABOLIC PANEL
Anion gap: 9 (ref 5–15)
BUN: 28 mg/dL — ABNORMAL HIGH (ref 8–23)
CO2: 25 mmol/L (ref 22–32)
Calcium: 9 mg/dL (ref 8.9–10.3)
Chloride: 109 mmol/L (ref 98–111)
Creatinine, Ser: 1.53 mg/dL — ABNORMAL HIGH (ref 0.44–1.00)
GFR, Estimated: 37 mL/min — ABNORMAL LOW (ref >60)
Glucose, Bld: 128 mg/dL — ABNORMAL HIGH (ref 70–99)
Potassium: 3.5 mmol/L (ref 3.5–5.1)
Sodium: 143 mmol/L (ref 135–145)

## 2021-09-07 NOTE — Progress Notes (Signed)
PROGRESS NOTE  [name] TIR:[id number] DOB: 04-Oct-1955 DOA: 08/30/2021 PCP: [name], MD   LOS: 7 days   Brief narrative: Patient is a 66 years old female with history of alcohol dependence, arthritis, chronic heart failure, history of breast cancer, CKD stage III, diverticulitis, hypertension presented to the hospital with seizure-like activity.  She was minimally responsive in the ED so history was initially limited.  According to EMS patient was initially very combative and uncooperative and 5 mg of Versed was given in route to the hospital.  Of note, there was history of alcohol abuse and was recently on an alcohol binge and suddenly stopped 3 days prior to presentation.  In the ED, patient had 2 separate episodes of tonic-clonic or seizure-like activity and was subsequently hypoxic, so bag and valve ventilation was done transitioning to nonrebreather mask.  Subsequently,patient had to be intubated and mechanically ventilated.  Patient was then transferred to the ICU.  Subsequently, the patient was considered stable for transfer out of the ICU.  Assessment/Plan:  Principal Problem:   Acute respiratory failure with hypoxia (HCC) Active Problems:   Essential hypertension   Alcoholic hepatitis   Morbid obesity with BMI of 60.0-69.9, adult (HCC)   Thrombocytopenia (HCC)   Chronic kidney disease, stage 3a (HCC)   Chronic diastolic CHF (congestive heart failure) (HCC)   Alcohol withdrawal seizure with delirium ([place])   UTI (urinary tract infection)   Alcohol withdrawal seizure ([place])   Cirrhosis of liver without ascites (HCC)  Acute respiratory failure due to encephalopathy from alcohol withdrawal and obtundation. Did have an aspiration event.  Currently resolved.  On room air.   Chronic alcoholism -likely relapse, most recently in remission May 2022.  Patient was agitated and encephalopathic with seizures on presentation and had to be intubated and mechanically ventilated.   Patient is very strongly motivated to quit alcohol and wishes to go to alcohol rehabilitation after discharge.  Continue clonidine patch at this time.   Urinary tract infection.   Patient received a 5-day course of Rocephin.    HTN  Clonidine patch was added on 09/05/2021.  Continue hydralazine labetalol. Continue amlodipine and Coreg    History of grade 2 diastolic dysfunction on echo April 2022  -  Currently Lasix on hold.   CKD4 - baseline creat 1.9, latest creatinine of 1.7.  We will continue to monitor.   Cirrhosis of liver- CT scan on July 2021 showed evidence of cirrhosis of liver.  Documented on CT abdomen July 2021.    Mild transamiitis and hyperbilirubinemia -  Likely secondary to cirrhosis of liver.  Right upper quadrant ultrasound showed no evidence of cholecystitis but sludge.  Chronic anemia -Baseline hemoglobin between 8.8 and 9.3 g%.  Hemoglobin of 9.1 at this time.   New onset thrombocytopenia Resolved at this time.  Latest platelet count of 287.  Debility, deconditioning.  Physical therapy is seen the patient and recommended skilled nursing facility placement at this time.  DVT prophylaxis: heparin injection 5,000 Units Start: 08/31/21 0600 Place and maintain sequential compression device Start: 08/31/21 0509   Code Status: Full code  Family Communication: Spoke with the patient at bedside.  Status is: Inpatient  Remains inpatient appropriate because: Alcohol withdrawal seizures status post intubation, skilled nursing facility placement.   Consultants: PCCM  Procedures: Intubation and mechanical ventilation.  Anti-infectives:  Completed IV Rocephin  Anti-infectives (From admission, onward)    Start     Dose/Rate Route Frequency Ordered Stop   09/02/21 1200  Ampicillin-Sulbactam (  UNASYN) 3 g in sodium chloride 0.9 % 100 mL IVPB  Status:  Discontinued        3 g 200 mL/hr over 30 Minutes Intravenous Every 12 hours 09/02/21 0430 09/02/21 0905    09/02/21 1000  cefTRIAXone (ROCEPHIN) 2 g in sodium chloride 0.9 % 100 mL IVPB        2 g 200 mL/hr over 30 Minutes Intravenous Every 24 hours 09/02/21 0905 09/06/21 0951   08/31/21 1200  Ampicillin-Sulbactam (UNASYN) 3 g in sodium chloride 0.9 % 100 mL IVPB  Status:  Discontinued        3 g 200 mL/hr over 30 Minutes Intravenous Every 6 hours 08/31/21 1019 09/02/21 0430   08/31/21 0515  cefTRIAXone (ROCEPHIN) 1 g in sodium chloride 0.9 % 100 mL IVPB  Status:  Discontinued        1 g 200 mL/hr over 30 Minutes Intravenous Every 24 hours 08/31/21 0508 08/31/21 1035       Subjective: Today, patient was seen and examined at bedside.  Patient states that he denies any nausea vomiting or abdominal pain.  Denies any chest pain, shortness of breath cough or fever.  Objective: Vitals:   09/07/21 0847 09/07/21 1052  BP:  (!) 152/68  Pulse: 87 89  Resp:  20  Temp:  98.6 F (37 C)  SpO2: 94% 96%    Intake/Output Summary (Last 24 hours) at 09/07/2021 1314 Last data filed at 09/07/2021 1000 Gross per 24 hour  Intake 453 ml  Output 150 ml  Net 303 ml   Filed Weights   09/06/21 0500 09/06/21 2313 09/07/21 0339  Weight: (!) 148.8 kg (!) 148 kg (!) 148 kg   Body mass index is 61.65 kg/m.   Physical Exam: GENERAL: Patient is alert awake and oriented. Not in obvious distress.  Morbidly obese HENT: No scleral pallor or icterus. Pupils equally reactive to light. Oral mucosa is moist NECK: is supple, no gross swelling noted. CHEST: Clear to auscultation. No crackles or wheezes.  Diminished breath sounds bilaterally. CVS: S1 and S2 heard, no murmur. Regular rate and rhythm.  ABDOMEN: Soft, non-tender, obese, bowel sounds are present. EXTREMITIES: No edema. CNS: Cranial nerves are intact. No focal motor deficits. SKIN: warm and dry without rashes.  Data Review: I have personally reviewed the following laboratory data and studies,  CBC: Recent Labs  Lab 09/02/21 0308 09/03/21 0617  09/04/21 0701 09/05/21 0302 09/07/21 0428  WBC 9.6 5.5 5.7 8.0 10.1  NEUTROABS  --  3.3 3.3  --   --   HGB 9.7* 8.5* 9.2* 9.7* 9.1*  HCT 30.5* 26.8* 28.3* 30.3* 28.3*  MCV 101.0* 100.8* 99.3 99.3 99.3  PLT 78* 100* 145* 193 858   Basic Metabolic Panel: Recent Labs  Lab 09/02/21 0308 09/02/21 1645 09/03/21 0617 09/03/21 1620 09/04/21 0701 09/05/21 0302 09/07/21 0428  NA 135  --  138  --  141 140 143  K 3.3*  --  3.2*  --  3.7 3.8 3.5  CL 102  --  102  --  110 110 109  CO2 22  --  25  --  22 22 25   GLUCOSE 102*  --  128*  --  147* 122* 128*  BUN 26*  --  31*  --  38* 39* 28*  CREATININE 2.90*  --  2.25*  --  2.04* 1.74* 1.53*  CALCIUM 8.0*  --  8.3*  --  8.8* 8.9 9.0  MG 2.4 2.0 2.0 2.0  1.8 1.7  --   PHOS 5.0* 5.0* 4.7* 4.1 3.6 3.7  --    Liver Function Tests: Recent Labs  Lab 09/01/21 0351 09/03/21 0617 09/04/21 0701 09/05/21 0302  AST 21 18 21 19   ALT 20 15 15 17   ALKPHOS 46 48 58 56  BILITOT 1.4* 0.7 0.7 0.9  PROT 6.5 6.2* 7.1 7.3  ALBUMIN 3.1* 2.6* 2.8* 3.1*   Recent Labs  Lab 09/01/21 0351  LIPASE 20  AMYLASE 30   No results for input(s): AMMONIA in the last 168 hours. Cardiac Enzymes: Recent Labs  Lab 09/02/21 1002  CKTOTAL 722*   BNP (last 3 results) No results for input(s): BNP in the last 8760 hours.  ProBNP (last 3 results) No results for input(s): PROBNP in the last 8760 hours.  CBG: Recent Labs  Lab 09/06/21 0739 09/06/21 1210 09/06/21 1514 09/06/21 1951 09/06/21 2316  GLUCAP 124* 111* 123* 124* 123*   Recent Results (from the past 240 hour(s))  Resp Panel by RT-PCR (Flu A&B, Covid) Nasopharyngeal Swab     Status: None   Collection Time: 08/31/21 12:01 AM   Specimen: Nasopharyngeal Swab; Nasopharyngeal(NP) swabs in vial transport medium  Result Value Ref Range Status   SARS Coronavirus 2 by RT PCR NEGATIVE NEGATIVE Final    Comment: (NOTE) SARS-CoV-2 target nucleic acids are NOT DETECTED.  The SARS-CoV-2 RNA is generally  detectable in upper respiratory specimens during the acute phase of infection. The lowest concentration of SARS-CoV-2 viral copies this assay can detect is 138 copies/mL. A negative result does not preclude SARS-Cov-2 infection and should not be used as the sole basis for treatment or other patient management decisions. A negative result may occur with  improper specimen collection/handling, submission of specimen other than nasopharyngeal swab, presence of viral mutation(s) within the areas targeted by this assay, and inadequate number of viral copies(<138 copies/mL). A negative result must be combined with clinical observations, patient history, and epidemiological information. The expected result is Negative.  Fact Sheet for Patients:  EntrepreneurPulse.com.au  Fact Sheet for Healthcare Providers:  IncredibleEmployment.be  This test is no t yet approved or cleared by the [place] FDA and  has been authorized for detection and/or diagnosis of SARS-CoV-2 by FDA under an Emergency Use Authorization (EUA). This EUA will remain  in effect (meaning this test can be used) for the duration of the COVID-19 declaration under Section 564(b)(1) of the Act, 21 U.S.C.section 360bbb-3(b)(1), unless the authorization is terminated  or revoked sooner.       Influenza A by PCR NEGATIVE NEGATIVE Final   Influenza B by PCR NEGATIVE NEGATIVE Final    Comment: (NOTE) The Xpert Xpress SARS-CoV-2/FLU/RSV plus assay is intended as an aid in the diagnosis of influenza from Nasopharyngeal swab specimens and should not be used as a sole basis for treatment. Nasal washings and aspirates are unacceptable for Xpert Xpress SARS-CoV-2/FLU/RSV testing.  Fact Sheet for Patients: EntrepreneurPulse.com.au  Fact Sheet for Healthcare Providers: IncredibleEmployment.be  This test is not yet approved or cleared by the [place] FDA  and has been authorized for detection and/or diagnosis of SARS-CoV-2 by FDA under an Emergency Use Authorization (EUA). This EUA will remain in effect (meaning this test can be used) for the duration of the COVID-19 declaration under Section 564(b)(1) of the Act, 21 U.S.C. section 360bbb-3(b)(1), unless the authorization is terminated or revoked.  Performed at [facility], [place] [address] [place], [place] [address]   Urine Culture  Status: Abnormal   Collection Time: 08/31/21 12:44 AM   Specimen: Urine, Catheterized  Result Value Ref Range Status   Specimen Description   Final    URINE, CATHETERIZED Performed at [place] [address] [place], [place] [address]    Special Requests   Final    NONE Performed at [facility], [place] [address], [place], [place] [address]    Culture >=100,000 COLONIES/mL ESCHERICHIA COLI (A)  Final   Report Status 09/02/2021 FINAL  Final   Organism ID, Bacteria ESCHERICHIA COLI (A)  Final      Susceptibility   Escherichia coli - MIC*    AMPICILLIN >=32 RESISTANT Resistant     CEFAZOLIN <=4 SENSITIVE Sensitive     CEFEPIME <=0.12 SENSITIVE Sensitive     CEFTRIAXONE <=0.25 SENSITIVE Sensitive     CIPROFLOXACIN <=0.25 SENSITIVE Sensitive     GENTAMICIN <=1 SENSITIVE Sensitive     IMIPENEM <=0.25 SENSITIVE Sensitive     NITROFURANTOIN <=16 SENSITIVE Sensitive     TRIMETH/SULFA <=20 SENSITIVE Sensitive     AMPICILLIN/SULBACTAM >=32 RESISTANT Resistant     PIP/TAZO <=4 SENSITIVE Sensitive     * >=100,000 COLONIES/mL ESCHERICHIA COLI  MRSA Next Gen by PCR, Nasal     Status: None   Collection Time: 08/31/21  6:41 AM   Specimen: Nasal Mucosa; Nasal Swab  Result Value Ref Range Status   MRSA by PCR Next Gen NOT DETECTED NOT DETECTED Final    Comment: (NOTE) The GeneXpert MRSA Assay (FDA approved for NASAL specimens only), is one component of a comprehensive MRSA colonization  surveillance program. It is not intended to diagnose MRSA infection nor to guide or monitor treatment for MRSA infections. Test performance is not FDA approved in patients less than 74 years old. Performed at [facility], [place] [address], [place], [place] [address]      Studies: No results found.   Flora Lipps, MD  [facility] 09/07/2021  If 7PM-7AM, please contact night-coverage

## 2021-09-07 NOTE — NC FL2 (Signed)
[place] LEVEL OF CARE SCREENING TOOL     IDENTIFICATION  Patient Name: [name] Birthdate: 07-Jan-1956 Sex: female Admission Date (Current Location): 08/30/2021  [facility] and [place] Number:  Herbalist and Address:  [facility],  [place] [address] [place]      Provider Number: [id number]  Attending Physician Name and Address:  Flora Lipps, MD  Relative Name and Phone Number:       Current Level of Care: Hospital Recommended Level of Care: [place] Prior Approval Number:    Date Approved/Denied:   PASRR Number: [id number]  Discharge Plan: SNF    Current Diagnoses: Patient Active Problem List   Diagnosis Date Noted   Cirrhosis of liver without ascites ([place])    Alcohol withdrawal seizure with delirium ([place]) 08/31/2021   Acute respiratory failure with hypoxia ([place]) 08/31/2021   UTI (urinary tract infection) 08/31/2021   Alcohol withdrawal seizure ([place]) 08/31/2021   Benign neoplasm of colon    Atypical glandular cells on cervical Pap smear 41/63/8453   Acute metabolic encephalopathy 64/68/0321   COVID-19 virus infection 08/01/2020   COVID-19 08/01/2020   Chronic diastolic CHF (congestive heart failure) ([place]) 06/26/2020   Thoracic aortic aneurysm without rupture 06/26/2020   Hyponatremia 05/31/2020   Pancytopenia ([place]) 05/31/2020   Cellulitis of thigh 04/29/2020   Tinea corporis 04/29/2020   Anemia of chronic disease 04/29/2020   Chronic kidney disease, stage 3a ([place] 04/29/2020   Cellulitis of abdominal wall    Constipation    Folate deficiency    Hypomagnesemia    Debility    Gout 03/03/2020   Skin breakdown 03/03/2020   Skin ulcer of perineum, limited to breakdown of skin ([place]) 03/03/2020   Leukocytosis 03/03/2020   Anemia associated with chemotherapy 03/03/2020   Thrombocytopenia ([place]) 03/03/2020   Port-A-Cath in place 02/19/2020   Acute renal failure (ARF) ([place]) 01/22/2020   S/P left  mastectomy 12/19/2019   Colon cancer screening 11/30/2019   Morbid obesity with BMI of 60.0-69.9, adult ([place]) 11/21/2019   Family history of ovarian cancer    Malignant neoplasm of upper-outer quadrant of left breast in female, estrogen receptor positive ([place]) 11/14/2019   Fracture of radial head, right, closed 06/25/2019   Frequent falls 01/10/2019   Bilateral bunions 01/10/2019   Toenail fungus 22/48/2500   Alcoholic hepatitis 37/11/8887   Edema 11/20/2018   Alcohol-induced mood disorder ([place]) 11/01/2013   Alcohol dependence ([place]) 11/01/2013   Essential hypertension 06/20/2007   LOW BACK PAIN 06/20/2007   DIVERTICULOSIS, COLON 05/05/2007    Orientation RESPIRATION BLADDER Height & Weight     Self, Time, Situation, Place  Normal Incontinent Weight: (!) 148 kg Height:  _0  (154.9 cm)  BEHAVIORAL SYMPTOMS/MOOD NEUROLOGICAL BOWEL NUTRITION STATUS      Continent Diet (Dysphagia 3)  AMBULATORY STATUS COMMUNICATION OF NEEDS Skin   Extensive Assist Verbally Other (Comment) (Cracking to feet)                       Personal Care Assistance Level of Assistance  Bathing, Feeding, Dressing Bathing Assistance: Limited assistance Feeding assistance: Independent Dressing Assistance: Limited assistance     Functional Limitations Info  Sight, Hearing, Speech Sight Info: Impaired Hearing Info: Adequate Speech Info: Adequate    SPECIAL CARE FACTORS FREQUENCY  PT (By licensed PT), OT (By licensed OT)     PT Frequency: 5 x weekly OT Frequency: 5 x weekly  Contractures Contractures Info: Not present    Additional Factors Info  Code Status, Allergies Code Status Info: Full Allergies Info: Amlodipine           Current Medications (09/07/2021):  This is the current hospital active medication list Current Facility-Administered Medications  Medication Dose Route Frequency Provider Last Rate Last Admin   albuterol (PROVENTIL) (2.5 MG/3ML) 0.083% nebulizer  solution 2.5 mg  2.5 mg Nebulization Q2H PRN Renee Pain, MD       amLODipine (NORVASC) tablet 10 mg  10 mg Oral Daily Mauri Brooklyn, MD   10 mg at 09/07/21 0857   carvedilol (COREG) tablet 25 mg  25 mg Oral BID WC Mauri Brooklyn, MD   25 mg at 09/07/21 0657   chlorhexidine gluconate (MEDLINE KIT) (PERIDEX) 0.12 % solution 15 mL  15 mL Mouth Rinse BID Brand Males, MD   15 mL at 09/06/21 1958   Chlorhexidine Gluconate Cloth 2 % PADS 6 each  6 each Topical Daily Antonieta Pert, MD   6 each at 09/07/21 0858   cloNIDine (CATAPRES - Dosed in mg/24 hr) patch 0.2 mg  0.2 mg Transdermal Weekly Tanda Rockers, MD   0.2 mg at 77/41/42 3953   folic acid injection 1 mg  1 mg Intravenous Daily Brand Males, MD   1 mg at 09/06/21 0913   heparin injection 5,000 Units  5,000 Units Subcutaneous Q8H Renee Pain, MD   5,000 Units at 09/07/21 0429   heparin lock flush 100 unit/mL  500 Units Intracatheter Q30 days Hunsucker, Bonna Gains, MD       And   heparin lock flush 100 unit/mL  500 Units Intracatheter PRN Hunsucker, Bonna Gains, MD       hydrALAZINE (APRESOLINE) injection 20 mg  20 mg Intravenous Q4H PRN Frederik Pear, MD   20 mg at 09/06/21 1707   labetalol (NORMODYNE) injection 10 mg  10 mg Intravenous Q1H PRN Anders Simmonds, MD   10 mg at 09/06/21 1513   MEDLINE mouth rinse  15 mL Mouth Rinse q12n4p Corey Harold, NP   15 mL at 09/06/21 1857   nystatin (MYCOSTATIN/NYSTOP) topical powder   Topical TID Brand Males, MD   Given at 09/06/21 2109   oxyCODONE (Oxy IR/ROXICODONE) immediate release tablet 5 mg  5 mg Oral Q4H PRN Tanda Rockers, MD   5 mg at 09/06/21 1707   sodium chloride flush (NS) 0.9 % injection 10-40 mL  10-40 mL Intracatheter Q12H Tanda Rockers, MD   40 mL at 09/07/21 0859   sodium chloride flush (NS) 0.9 % injection 10-40 mL  10-40 mL Intracatheter PRN Tanda Rockers, MD       thiamine (B-1) injection 100 mg  100 mg Intravenous Daily Brand Males, MD   100 mg at  09/07/21 2023     Discharge Medications: Please see discharge summary for a list of discharge medications.  Relevant Imaging Results:  Relevant Lab Results:   Additional Information [id number] Covid vaccination x 2  Dulcie Gammon, Marjie Skiff, RN

## 2021-09-07 NOTE — Progress Notes (Signed)
Speech Language Pathology Treatment: Dysphagia  Patient Details Name: [name] MRN: [id number] DOB: 09-13-1955 Today's Date: 09/07/2021 Time: 3716-9678 SLP Time Calculation (min) (ACUTE ONLY): 20 min  Assessment / Plan / Recommendation Clinical Impression  Patient seen to address dysphagia goals and determine if ready for upgrade of liquid consistency from nectar to thin liquids. Patient awake and alert and meal tray just arrived. She reported that she has not enjoyed the thickened liquids and that "Ive had regular water". SLP observed patient with straw sips of thin liquids (water, apple juice) and no overt s/s aspiration or penetration observed. Positioning in bed was not ideal as HOB was elevated as high as it would go and this left patient semi-reclined. SLP is recommending to upgrade patient's liquids to thin at this time but continue with Dys 3 solids as she is edentulous. Plan for SLP to try to f/u at least one more time prior to discharge to ensure toleration.   HPI HPI: Pt admitted from home by Ambulance with delirium and acute hypoxic respiratory failure associated with ETOH withdrawal.  Pt required intubation 1/2 and was extubated 09/04/21 pm.  Pt with hx of ETOH abuse, CHF, morbid obesity, CKD Stage 3, thoracic aortic aneurysm, and breast CA s/p L mastectomy.   Swallow evaluation order received. Pt denies dysphagia, voice is hoarse - but improving per pt and RN.  CXR 09/04/21 . "There is improvement in aeration of  right lower lung fields suggesting decrease in  atelectasis/pneumonia. Small right pleural effusion."      SLP Plan  Continue with current plan of care      Recommendations for follow up therapy are one component of a multi-disciplinary discharge planning process, led by the attending physician.  Recommendations may be updated based on patient status, additional functional criteria and insurance authorization.    Recommendations  Diet recommendations: Dysphagia 3  (mechanical soft);Thin liquid Liquids provided via: Cup;Straw Medication Administration: Whole meds with liquid Supervision: Patient able to self feed Compensations: Slow rate;Small sips/bites;Other (Comment) Postural Changes and/or Swallow Maneuvers: Seated upright 90 degrees (sit as upright as possible)                Oral Care Recommendations: Oral care BID Follow Up Recommendations: No SLP follow up Assistance recommended at discharge: Intermittent Supervision/Assistance SLP Visit Diagnosis: Dysphagia, unspecified (R13.10) Plan: Continue with current plan of care          Sonia Baller, MA, CCC-SLP Speech Therapy  09/07/2021, 4:17 PM

## 2021-09-07 NOTE — TOC Initial Note (Signed)
Transition of Care [facility]) - Initial/Assessment Note    Patient Details  Name: [name] MRN: [id number] Date of Birth: 1956-05-25  [facility]) CM/SW Contact:    Lynnell Catalan, RN Phone Number: 09/07/2021, 11:28 AM  Clinical Narrative:                 Spoke with pt at bedside for dc planning. Pt agrees to SNF placement. FL2 faxed out to area SNFs. TOC will provide SNF bed offers when available.  Expected Discharge Plan: Skilled Nursing Facility Barriers to Discharge: Continued Medical Work up   Patient Goals and CMS Choice Patient states their goals for this hospitalization and ongoing recovery are:: To get stronger      Expected Discharge Plan and Services Expected Discharge Plan: [place]   Discharge Planning Services: CM Consult   Living arrangements for the past 2 months: Apartment                           Prior Living Arrangements/Services Living arrangements for the past 2 months: Apartment Lives with:: Self Patient language and need for interpreter reviewed:: Yes        Need for Family Participation in Patient Care: Yes (Comment) Care giver support system in place?: Yes (comment)   Criminal Activity/Legal Involvement Pertinent to Current Situation/Hospitalization: No - Comment as needed  Activities of Daily Living Home Assistive Devices/Equipment: None ADL Screening (condition at time of admission) Patient's cognitive ability adequate to safely complete daily activities?: Yes Is the patient deaf or have difficulty hearing?: No Does the patient have difficulty seeing, even when wearing glasses/contacts?: No Does the patient have difficulty concentrating, remembering, or making decisions?: No Patient able to express need for assistance with ADLs?: Yes Does the patient have difficulty dressing or bathing?: Yes Independently performs ADLs?: No Communication: Independent Dressing (OT): Needs assistance Is this a change from  baseline?: Pre-admission baseline Does the patient have difficulty walking or climbing stairs?: Yes Weakness of Legs: Both Weakness of Arms/Hands: Both  Permission Sought/Granted                  Emotional Assessment Appearance:: Appears stated age Attitude/Demeanor/Rapport: Gracious Affect (typically observed): Calm Orientation: : Oriented to Self, Oriented to Place, Oriented to  Time, Oriented to Situation Alcohol / Substance Use: Alcohol Use Psych Involvement: No (comment)  Admission diagnosis:  Hypocalcemia [E83.51] Hypokalemia [E87.6] Lower urinary tract infectious disease [N39.0] Cannabis abuse [F12.10] Alcohol withdrawal seizure ([place]) [X32.355, R56.9] Transient apnea [R06.81] Endotracheal tube present [Z97.8] Endotracheally intubated [Z97.8] Alcohol withdrawal seizure with delirium ([place]) [D32.202, R56.9] Expected difficult intubation [Z78.9] Patient Active Problem List   Diagnosis Date Noted   Cirrhosis of liver without ascites ([place])    Alcohol withdrawal seizure with delirium ([place]) 08/31/2021   Acute respiratory failure with hypoxia ([place]) 08/31/2021   UTI (urinary tract infection) 08/31/2021   Alcohol withdrawal seizure ([place]) 08/31/2021   Benign neoplasm of colon    Atypical glandular cells on cervical Pap smear 54/27/0623   Acute metabolic encephalopathy 76/28/3151   COVID-19 virus infection 08/01/2020   COVID-19 08/01/2020   Chronic diastolic CHF (congestive heart failure) ([place]) 06/26/2020   Thoracic aortic aneurysm without rupture 06/26/2020   Hyponatremia 05/31/2020   Pancytopenia ([place]) 05/31/2020   Cellulitis of thigh 04/29/2020   Tinea corporis 04/29/2020   Anemia of chronic disease 04/29/2020   Chronic kidney disease, stage 3a ([place]) 04/29/2020   Cellulitis of abdominal wall  Constipation    Folate deficiency    Hypomagnesemia    Debility    Gout 03/03/2020   Skin breakdown 03/03/2020   Skin ulcer of perineum, limited to breakdown of skin  ([place]) 03/03/2020   Leukocytosis 03/03/2020   Anemia associated with chemotherapy 03/03/2020   Thrombocytopenia ([place]) 03/03/2020   Port-A-Cath in place 02/19/2020   Acute renal failure (ARF) ([place]) 01/22/2020   S/P left mastectomy 12/19/2019   Colon cancer screening 11/30/2019   Morbid obesity with BMI of 60.0-69.9, adult ([place]) 11/21/2019   Family history of ovarian cancer    Malignant neoplasm of upper-outer quadrant of left breast in female, estrogen receptor positive ([place]) 11/14/2019   Fracture of radial head, right, closed 06/25/2019   Frequent falls 01/10/2019   Bilateral bunions 01/10/2019   Toenail fungus 38/45/3646   Alcoholic hepatitis 80/32/1224   Edema 11/20/2018   Alcohol-induced mood disorder ([place]) 11/01/2013   Alcohol dependence ([place]) 11/01/2013   Essential hypertension 06/20/2007   LOW BACK PAIN 06/20/2007   DIVERTICULOSIS, COLON 05/05/2007   PCP:  [name], MD Pharmacy:   [id number] - [place], [place] [address] Phone: [phone] Fax: [phone]     Social Determinants of Health (SDOH) Interventions    Readmission Risk Interventions Readmission Risk Prevention Plan 09/07/2021 03/06/2020 02/13/2020  Transportation Screening Complete Complete Complete  PCP or Specialist Appt within 3-5 Days - Not Complete -  Not Complete comments - Unsure of dc date at this time -  [place] or [place] - Complete -  Social Work Consult for [place] Planning/Counseling - Complete -  Palliative Care Screening - Not Applicable -  Medication Review Press photographer) Complete Complete Complete  PCP or Specialist appointment within 3-5 days of discharge Complete - Complete  HRI or Home Care Consult Complete - Complete  SW Recovery Care/Counseling Consult Complete - Complete  Palliative Care Screening Not Applicable - Complete  Skilled Nursing Facility Complete - Not  Applicable  Some recent data might be hidden

## 2021-09-08 LAB — COMPREHENSIVE METABOLIC PANEL
ALT: 24 U/L (ref 0–44)
AST: 35 U/L (ref 15–41)
Albumin: 2.8 g/dL — ABNORMAL LOW (ref 3.5–5.0)
Alkaline Phosphatase: 59 U/L (ref 38–126)
Anion gap: 9 (ref 5–15)
BUN: 24 mg/dL — ABNORMAL HIGH (ref 8–23)
CO2: 25 mmol/L (ref 22–32)
Calcium: 8.5 mg/dL — ABNORMAL LOW (ref 8.9–10.3)
Chloride: 105 mmol/L (ref 98–111)
Creatinine, Ser: 1.34 mg/dL — ABNORMAL HIGH (ref 0.44–1.00)
GFR, Estimated: 44 mL/min — ABNORMAL LOW (ref >60)
Glucose, Bld: 114 mg/dL — ABNORMAL HIGH (ref 70–99)
Potassium: 3.4 mmol/L — ABNORMAL LOW (ref 3.5–5.1)
Sodium: 139 mmol/L (ref 135–145)
Total Bilirubin: 0.7 mg/dL (ref 0.3–1.2)
Total Protein: 6.9 g/dL (ref 6.5–8.1)

## 2021-09-08 LAB — CBC
HCT: 28.3 % — ABNORMAL LOW (ref 36.0–46.0)
Hemoglobin: 9 g/dL — ABNORMAL LOW (ref 12.0–15.0)
MCH: 31.8 pg (ref 26.0–34.0)
MCHC: 31.8 g/dL (ref 30.0–36.0)
MCV: 100 fL (ref 80.0–100.0)
Platelets: 305 10*3/uL (ref 150–400)
RBC: 2.83 MIL/uL — ABNORMAL LOW (ref 3.87–5.11)
RDW: 13.4 % (ref 11.5–15.5)
WBC: 6.8 10*3/uL (ref 4.0–10.5)
nRBC: 0 % (ref 0.0–0.2)

## 2021-09-08 LAB — MAGNESIUM: Magnesium: 1.5 mg/dL — ABNORMAL LOW (ref 1.7–2.4)

## 2021-09-08 MED ORDER — MAGNESIUM SULFATE 2 GM/50ML IV SOLN
2.0000 g | Freq: Once | INTRAVENOUS | Status: AC
  Administered 2021-09-08: 2 g via INTRAVENOUS
  Filled 2021-09-08: qty 50

## 2021-09-08 MED ORDER — ADULT MULTIVITAMIN W/MINERALS CH
1.0000 | ORAL_TABLET | Freq: Every day | ORAL | Status: DC
  Administered 2021-09-08 – 2021-09-12 (×5): 1 via ORAL
  Filled 2021-09-08 (×5): qty 1

## 2021-09-08 MED ORDER — MAGNESIUM OXIDE -MG SUPPLEMENT 400 (240 MG) MG PO TABS
400.0000 mg | ORAL_TABLET | Freq: Two times a day (BID) | ORAL | Status: DC
  Administered 2021-09-08 – 2021-09-12 (×8): 400 mg via ORAL
  Filled 2021-09-08 (×8): qty 1

## 2021-09-08 MED ORDER — POTASSIUM CHLORIDE CRYS ER 20 MEQ PO TBCR
40.0000 meq | EXTENDED_RELEASE_TABLET | Freq: Every day | ORAL | Status: DC
  Administered 2021-09-08 – 2021-09-12 (×5): 40 meq via ORAL
  Filled 2021-09-08 (×5): qty 2

## 2021-09-08 MED ORDER — CHLORHEXIDINE GLUCONATE 0.12 % MT SOLN
OROMUCOSAL | Status: AC
  Filled 2021-09-08: qty 15

## 2021-09-08 MED ORDER — ENSURE MAX PROTEIN PO LIQD
11.0000 [oz_av] | Freq: Every day | ORAL | Status: DC
  Administered 2021-09-08 – 2021-09-09 (×2): 11 [oz_av] via ORAL
  Filled 2021-09-08 (×4): qty 330

## 2021-09-08 NOTE — Progress Notes (Addendum)
Nutrition Follow-up  DOCUMENTATION CODES:   Morbid obesity  INTERVENTION:   -Ensure MAX Protein po daily, each supplement provides 150 kcal and 30 grams of protein   -Multivitamin with minerals daily   NUTRITION DIAGNOSIS:   Inadequate oral intake related to inability to eat as evidenced by NPO status.  Now on dysphagia 3  GOAL:   Patient will meet greater than or equal to 90% of their needs  Progressing.  MONITOR:   PO intake, Supplement acceptance, Labs, Weight trends, I & O's  ASSESSMENT:   66 year old female with medical history of HTN, alcohol abuse, stage 3 invasive ductal carcinoma of the L breast (dx 10/2019 s/p radiacal mastectomy 11/2019 with adjuctive chemoradiation complete 04/2020), stage 3 CKD, CHF, and anemia of chronic disease. She presented to the ED via EMS after witnessed seizure-like activity. Family reported to EMS staff that patient has alcohol abuse and was recently on an alcohol binge but abruptly stopped drinking 3 days prior. In the ED she had 2 separate 10-second episodes of tonic-clonic seizure activity.  1/1: admitted 1/2: intubated 1/6: extubated 1/7: diet advanced to dysphagia 3-nectar thick  Patient consuming 50-75% of meals at this time. Will order Ensure Max for additional protein.   Admission weight: 304 lbs. Current weight: 282 lbs  Medications: Folic acid, Thiamine  Labs reviewed:  CBGs: 111-124  Diet Order:   Diet Order             DIET DYS 3 Room service appropriate? Yes; Fluid consistency: Nectar Thick  Diet effective now                   EDUCATION NEEDS:   No education needs have been identified at this time  Skin:  Skin Assessment: Reviewed RN Assessment  Last BM:  1/8  Height:   Ht Readings from Last 1 Encounters:  08/31/21 5\' 1"  (1.549 m)    Weight:   Wt Readings from Last 1 Encounters:  09/08/21 128 kg    BMI:  Body mass index is 53.32 kg/m.  Estimated Nutritional Needs:   Kcal:  1518-1795  kcal  Protein:  >/= 119 grams  Fluid:  >/= 1.7 L/day  Sue Bibles, MS, RD, LDN Inpatient Clinical Dietitian Contact information available via Amion

## 2021-09-08 NOTE — Progress Notes (Signed)
Noted patient's left hand is swollen and a quite a bit bigger then her right hand. It's also firmer around her arm and wrist then her right side. That hand hurts and it hurts along with her left shoulder. Also slightly discolored. Flora Lipps, MD was made aware.

## 2021-09-08 NOTE — TOC Progression Note (Signed)
Transition of Care [facility]) - Progression Note    Patient Details  Name: [name] MRN: [id number] Date of Birth: 1956/02/08  Transition of Care [facility]) CM/SW Contact  Uno Esau, Marjie Skiff, RN Phone Number: 09/08/2021, 1:24 PM  Clinical Narrative:    Pt has no SNF bed offers. Faxed FL2 to a larger service area.   Expected Discharge Plan: Skilled Nursing Facility Barriers to Discharge: Continued Medical Work up  Expected Discharge Plan and Services Expected Discharge Plan: [place]   Discharge Planning Services: CM Consult   Living arrangements for the past 2 months: Apartment                   Social Determinants of Health (SDOH) Interventions    Readmission Risk Interventions Readmission Risk Prevention Plan 09/07/2021 03/06/2020 02/13/2020  Transportation Screening Complete Complete Complete  PCP or Specialist Appt within 3-5 Days - Not Complete -  Not Complete comments - Unsure of dc date at this time -  [place] or [place] - Complete -  Social Work Consult for [place] Planning/Counseling - Complete -  Palliative Care Screening - Not Applicable -  Medication Review Press photographer) Complete Complete Complete  PCP or Specialist appointment within 3-5 days of discharge Complete - Complete  HRI or Home Care Consult Complete - Complete  SW Recovery Care/Counseling Consult Complete - Complete  Palliative Care Screening Not Applicable - Complete  Skilled Nursing Facility Complete - Not Applicable  Some recent data might be hidden

## 2021-09-08 NOTE — Progress Notes (Signed)
Physical Therapy Treatment Patient Details Name: [name] MRN: [id number] DOB: [birth year] Today's Date: 09/08/2021   History of Present Illness Pt admitted from home by Ambulance 2* seires with delirium and acute hypoxic respiratory failure associated with ETOH withdrawal.  Pt required intubation and was extubated 09/04/21.  Pt with hx of ETOH abuse, CHF, morbid obesity, CKD Stage 3, thoracic aortic aneurysm, and breast CA s/p L mastectomy    PT Comments    Patient eager to participate in therapy to improve mobility and independence. Pt completed rolling bil in bed for trunk activation and reaching/tossing with bil UE and contralateral reach for abdominal activation. Pt assist to EOB with Max +2 assist and limited by Lt UE pain and generalized pain sitting EOB for ~8 minutes. Max/Total assist to return to supine and reposition. RN notified of Lt hand edema. Acute PT will continue to progress pt as able throughout stay. SNF follow up remains appropriate for pt.    Recommendations for follow up therapy are one component of a multi-disciplinary discharge planning process, led by the attending physician.  Recommendations may be updated based on patient status, additional functional criteria and insurance authorization.  Follow Up Recommendations  Skilled nursing-short term rehab (<3 hours/day)     Assistance Recommended at Discharge Frequent or constant Supervision/Assistance  Patient can return home with the following     Equipment Recommendations  None recommended by PT    Recommendations for Other Services OT consult     Precautions / Restrictions Precautions Precautions: Fall Restrictions Weight Bearing Restrictions: No     Mobility  Bed Mobility Overal bed mobility: Needs Assistance Bed Mobility: Rolling;Sidelying to Sit;Sit to Supine Rolling: Mod assist Sidelying to sit: Mod assist;Max assist;HOB elevated;+2 for physical assistance;+2 for safety/equipment   Sit to  supine: Total assist;Max assist;+2 for safety/equipment;+2 for physical assistance   General bed mobility comments: Mod assist to roll Rt/Lt with assist of bed pad to roll lower trunk. Mod-Max assist +2 for pivot with bed pad to move supine<>sit and raise trunk. pt initaited movements with LE's to move to EOB. pt limited by pain in Lt UE to press up trunk. Pt able to sit EOB with min guard but pain incresaed and Max+2/Total assist required to return to supine.    Transfers                        Ambulation/Gait                   Stairs             Wheelchair Mobility    Modified Rankin (Stroke Patients Only)       Balance Overall balance assessment: Needs assistance Sitting-balance support: Single extremity supported;Feet unsupported Sitting balance-Leahy Scale: Fair Sitting balance - Comments: pt performed seated balance with attempt at UE exercise, but limited by pain in Lt UE and LE's sitting EOB                                    Cognition Arousal/Alertness: Awake/alert Behavior During Therapy: Flat affect Overall Cognitive Status: Within Functional Limits for tasks assessed                                          Exercises Other Exercises Other  Exercises: pt supine in bed with HOB slightly elevated: sock ball toss with bil UE (contralateral trunk reach to pick up sock and then toss back) Other Exercises: 2x 10 reps sock press up for shoulder flexion in supine    General Comments General comments (skin integrity, edema, etc.): Lt hand and forwarm edematous with increased turgor. pt with pain with AROM and PROM to hadn and wrist. RN notified of concern.      Pertinent Vitals/Pain Pain Assessment: Faces Faces Pain Scale: Hurts even more Pain Location: Lt shoulder and hand Pain Descriptors / Indicators: Aching;Discomfort Pain Intervention(s): Monitored during session;Limited activity within patient's  tolerance;Repositioned;Other (comment) (hand and wrist swollen with increaed turgor, notified RN)    Home Living                          Prior Function            PT Goals (current goals can now be found in the care plan section) Acute Rehab PT Goals Patient Stated Goal: Regain IND PT Goal Formulation: With patient Time For Goal Achievement: 09/19/21 Potential to Achieve Goals: Fair Progress towards PT goals: Progressing toward goals    Frequency    Min 3X/week      PT Plan Current plan remains appropriate    Co-evaluation              AM-PAC PT "6 Clicks" Mobility   Outcome Measure  Help needed turning from your back to your side while in a flat bed without using bedrails?: A Lot Help needed moving from lying on your back to sitting on the side of a flat bed without using bedrails?: Total Help needed moving to and from a bed to a chair (including a wheelchair)?: Total Help needed standing up from a chair using your arms (e.g., wheelchair or bedside chair)?: Total Help needed to walk in hospital room?: Total Help needed climbing 3-5 steps with a railing? : Total 6 Click Score: 7    End of Session Equipment Utilized During Treatment: Oxygen Activity Tolerance: Patient tolerated treatment well;Patient limited by pain Patient left: in bed;with call bell/phone within reach;with nursing/sitter in room Nurse Communication: Mobility status;Need for lift equipment PT Visit Diagnosis: Difficulty in walking, not elsewhere classified (R26.2);Muscle weakness (generalized) (M62.81);Pain     Time: 8329-1916 PT Time Calculation (min) (ACUTE ONLY): 33 min  Charges:  $Therapeutic Exercise: 8-22 mins $Therapeutic Activity: 8-22 mins                     Verner Mould, DPT Acute Rehabilitation Services Office [phone] Pager [phone]    Jacques Navy 09/08/2021, 5:11 PM

## 2021-09-08 NOTE — Progress Notes (Addendum)
PROGRESS NOTE  [name] ZOX:[id number] DOB: December 22, 1955 DOA: 08/30/2021 PCP: [name], MD   LOS: 8 days   Brief narrative: Patient is a 66 years old female with history of alcohol dependence, arthritis, chronic heart failure, history of breast cancer, CKD stage III, diverticulitis, hypertension presented to the hospital with seizure-like activity.  She was minimally responsive in the ED so history was initially limited.  According to EMS, patient was initially very combative and uncooperative and 5 mg of Versed was given in route to the hospital.  Of note, there was history of alcohol abuse and was recently on an alcohol binge and suddenly stopped 3 days prior to presentation.  In the ED, patient had 2 separate episodes of tonic-clonic or seizure-like activity and was subsequently hypoxic, so bag and valve ventilation was done transitioning to nonrebreather mask.  Subsequently, patient had to be intubated and mechanically ventilated.  Patient was then transferred to the ICU.  Subsequently, the patient was considered stable for transfer out of the ICU.  Assessment/Plan:  Principal Problem:   Acute respiratory failure with hypoxia (HCC) Active Problems:   Essential hypertension   Alcoholic hepatitis   Morbid obesity with BMI of 60.0-69.9, adult (HCC)   Thrombocytopenia (HCC)   Chronic kidney disease, stage 3a (HCC)   Chronic diastolic CHF (congestive heart failure) (HCC)   Alcohol withdrawal seizure with delirium ([place])   UTI (urinary tract infection)   Alcohol withdrawal seizure ([place])   Cirrhosis of liver without ascites (HCC)  Acute respiratory failure due to encephalopathy from alcohol withdrawal and obtundation. Did have an aspiration event.  Currently resolved.  On room air.   Chronic alcoholism -likely relapse, most recently in remission May 2022.  Patient was agitated and encephalopathic with seizures on presentation and had to be intubated and mechanically ventilated.   Patient is very strongly motivated to quit alcohol and wishes to go to alcohol rehabilitation after discharge.  Continue clonidine patch at this time.   Urinary tract infection.   Patient received a 5-day course of Rocephin.    Mild hypokalemia and hypomagnesemia.  We will replenish.  We will give 2 g of magnesium sulfate add magnesium oxide and oral potassium.  Essential HTN  Clonidine patch was added on 09/05/2021.  Continue hydralazine labetalol PRN. Continue amlodipine and Coreg    History of grade 2 diastolic dysfunction on echo April 2022  -  Currently Lasix on hold.   CKD4 - baseline creat 1.9, latest creatinine of 1.5.  We will continue to monitor.   Cirrhosis of liver- CT scan on July 2021 showed evidence of cirrhosis of liver.  Documented on CT abdomen July 2021.    Mild transamiitis and hyperbilirubinemia -  Likely secondary to cirrhosis of liver.  Right upper quadrant ultrasound showed no evidence of cholecystitis but sludge.  Chronic anemia -Baseline hemoglobin between 8.8 and 9.3 g.  Hemoglobin of 9.1 at this time.   New onset thrombocytopenia Resolved at this time.  Latest platelet count of 287.  Debility, deconditioning.  Physical therapy is seen the patient and recommended skilled nursing facility placement at this time.  Disposition.  Awaiting for skilled nursing facility placement.  Medically stable for disposition  DVT prophylaxis: heparin injection 5,000 Units Start: 08/31/21 0600 Place and maintain sequential compression device Start: 08/31/21 0509   Code Status: Full code  Family Communication: Spoke with the patient at bedside.  Status is: Inpatient  Remains inpatient appropriate because: Alcohol withdrawal seizures status post intubation, awaiting for skilled  nursing facility placement.   Consultants: PCCM  Procedures: Intubation and mechanical ventilation.  Anti-infectives:  IV Rocephin> completed  Anti-infectives (From admission, onward)     Start     Dose/Rate Route Frequency Ordered Stop   09/02/21 1200  Ampicillin-Sulbactam (UNASYN) 3 g in sodium chloride 0.9 % 100 mL IVPB  Status:  Discontinued        3 g 200 mL/hr over 30 Minutes Intravenous Every 12 hours 09/02/21 0430 09/02/21 0905   09/02/21 1000  cefTRIAXone (ROCEPHIN) 2 g in sodium chloride 0.9 % 100 mL IVPB        2 g 200 mL/hr over 30 Minutes Intravenous Every 24 hours 09/02/21 0905 09/06/21 0951   08/31/21 1200  Ampicillin-Sulbactam (UNASYN) 3 g in sodium chloride 0.9 % 100 mL IVPB  Status:  Discontinued        3 g 200 mL/hr over 30 Minutes Intravenous Every 6 hours 08/31/21 1019 09/02/21 0430   08/31/21 0515  cefTRIAXone (ROCEPHIN) 1 g in sodium chloride 0.9 % 100 mL IVPB  Status:  Discontinued        1 g 200 mL/hr over 30 Minutes Intravenous Every 24 hours 08/31/21 0508 08/31/21 1035       Subjective:  Today, patient was seen and examined at bedside.  Patient denies any nausea vomiting abdominal pain shortness of breath cough or fever .  Objective: Vitals:   09/07/21 2100 09/08/21 0525  BP: 136/61 (!) 161/72  Pulse: 85 82  Resp: 20 20  Temp: 98.8 F (37.1 C) 98.6 F (37 C)  SpO2: 98% 99%    Intake/Output Summary (Last 24 hours) at 09/08/2021 1116 Last data filed at 09/08/2021 0600 Gross per 24 hour  Intake 715 ml  Output 850 ml  Net -135 ml    Filed Weights   09/06/21 2313 09/07/21 0339 09/08/21 0500  Weight: (!) 148 kg (!) 148 kg 128 kg   Body mass index is 53.32 kg/m.   Physical Exam: General: Morbidly obese built, not in obvious distress HENT:   No scleral pallor or icterus noted. Oral mucosa is moist.  Chest:  Clear breath sounds.  Diminished breath sounds bilaterally. No crackles or wheezes.  CVS: S1 &S2 heard. No murmur.  Regular rate and rhythm. Abdomen: Soft, nontender, obese abdomen nondistended.  Bowel sounds are heard.   Extremities: No cyanosis, clubbing or edema.  Peripheral pulses are palpable. Psych: Alert, awake and  oriented, normal mood CNS:  No cranial nerve deficits.  Power equal in all extremities.   Skin: Warm and dry.  No rashes noted.  Data Review: I have personally reviewed the following laboratory data and studies,  CBC: Recent Labs  Lab 09/02/21 0308 09/03/21 0617 09/04/21 0701 09/05/21 0302 09/07/21 0428  WBC 9.6 5.5 5.7 8.0 10.1  NEUTROABS  --  3.3 3.3  --   --   HGB 9.7* 8.5* 9.2* 9.7* 9.1*  HCT 30.5* 26.8* 28.3* 30.3* 28.3*  MCV 101.0* 100.8* 99.3 99.3 99.3  PLT 78* 100* 145* 193 062    Basic Metabolic Panel: Recent Labs  Lab 09/02/21 0308 09/02/21 1645 09/03/21 0617 09/03/21 1620 09/04/21 0701 09/05/21 0302 09/07/21 0428  NA 135  --  138  --  141 140 143  K 3.3*  --  3.2*  --  3.7 3.8 3.5  CL 102  --  102  --  110 110 109  CO2 22  --  25  --  22 22 25   GLUCOSE 102*  --  128*  --  147* 122* 128*  BUN 26*  --  31*  --  38* 39* 28*  CREATININE 2.90*  --  2.25*  --  2.04* 1.74* 1.53*  CALCIUM 8.0*  --  8.3*  --  8.8* 8.9 9.0  MG 2.4 2.0 2.0 2.0 1.8 1.7  --   PHOS 5.0* 5.0* 4.7* 4.1 3.6 3.7  --     Liver Function Tests: Recent Labs  Lab 09/03/21 0617 09/04/21 0701 09/05/21 0302  AST 18 21 19   ALT 15 15 17   ALKPHOS 48 58 56  BILITOT 0.7 0.7 0.9  PROT 6.2* 7.1 7.3  ALBUMIN 2.6* 2.8* 3.1*    No results for input(s): LIPASE, AMYLASE in the last 168 hours.  No results for input(s): AMMONIA in the last 168 hours. Cardiac Enzymes: Recent Labs  Lab 09/02/21 1002  CKTOTAL 722*    BNP (last 3 results) No results for input(s): BNP in the last 8760 hours.  ProBNP (last 3 results) No results for input(s): PROBNP in the last 8760 hours.  CBG: Recent Labs  Lab 09/06/21 0739 09/06/21 1210 09/06/21 1514 09/06/21 1951 09/06/21 2316  GLUCAP 124* 111* 123* 124* 123*    Recent Results (from the past 240 hour(s))  Resp Panel by RT-PCR (Flu A&B, Covid) Nasopharyngeal Swab     Status: None   Collection Time: 08/31/21 12:01 AM   Specimen: Nasopharyngeal  Swab; Nasopharyngeal(NP) swabs in vial transport medium  Result Value Ref Range Status   SARS Coronavirus 2 by RT PCR NEGATIVE NEGATIVE Final    Comment: (NOTE) SARS-CoV-2 target nucleic acids are NOT DETECTED.  The SARS-CoV-2 RNA is generally detectable in upper respiratory specimens during the acute phase of infection. The lowest concentration of SARS-CoV-2 viral copies this assay can detect is 138 copies/mL. A negative result does not preclude SARS-Cov-2 infection and should not be used as the sole basis for treatment or other patient management decisions. A negative result may occur with  improper specimen collection/handling, submission of specimen other than nasopharyngeal swab, presence of viral mutation(s) within the areas targeted by this assay, and inadequate number of viral copies(<138 copies/mL). A negative result must be combined with clinical observations, patient history, and epidemiological information. The expected result is Negative.  Fact Sheet for Patients:  EntrepreneurPulse.com.au  Fact Sheet for Healthcare Providers:  IncredibleEmployment.be  This test is no t yet approved or cleared by the [place] FDA and  has been authorized for detection and/or diagnosis of SARS-CoV-2 by FDA under an Emergency Use Authorization (EUA). This EUA will remain  in effect (meaning this test can be used) for the duration of the COVID-19 declaration under Section 564(b)(1) of the Act, 21 U.S.C.section 360bbb-3(b)(1), unless the authorization is terminated  or revoked sooner.       Influenza A by PCR NEGATIVE NEGATIVE Final   Influenza B by PCR NEGATIVE NEGATIVE Final    Comment: (NOTE) The Xpert Xpress SARS-CoV-2/FLU/RSV plus assay is intended as an aid in the diagnosis of influenza from Nasopharyngeal swab specimens and should not be used as a sole basis for treatment. Nasal washings and aspirates are unacceptable for Xpert Xpress  SARS-CoV-2/FLU/RSV testing.  Fact Sheet for Patients: EntrepreneurPulse.com.au  Fact Sheet for Healthcare Providers: IncredibleEmployment.be  This test is not yet approved or cleared by the [place] FDA and has been authorized for detection and/or diagnosis of SARS-CoV-2 by FDA under an Emergency Use Authorization (EUA). This EUA will remain in effect (meaning this test can  be used) for the duration of the COVID-19 declaration under Section 564(b)(1) of the Act, 21 U.S.C. section 360bbb-3(b)(1), unless the authorization is terminated or revoked.  Performed at [facility], [place] [address], [place], [place] [address]   Urine Culture     Status: Abnormal   Collection Time: 08/31/21 12:44 AM   Specimen: Urine, Catheterized  Result Value Ref Range Status   Specimen Description   Final    URINE, CATHETERIZED Performed at [place] [address], [place], [place] [address]    Special Requests   Final    NONE Performed at [facility], [place] [address], [place], [place] [address]    Culture >=100,000 COLONIES/mL ESCHERICHIA COLI (A)  Final   Report Status 09/02/2021 FINAL  Final   Organism ID, Bacteria ESCHERICHIA COLI (A)  Final      Susceptibility   Escherichia coli - MIC*    AMPICILLIN >=32 RESISTANT Resistant     CEFAZOLIN <=4 SENSITIVE Sensitive     CEFEPIME <=0.12 SENSITIVE Sensitive     CEFTRIAXONE <=0.25 SENSITIVE Sensitive     CIPROFLOXACIN <=0.25 SENSITIVE Sensitive     GENTAMICIN <=1 SENSITIVE Sensitive     IMIPENEM <=0.25 SENSITIVE Sensitive     NITROFURANTOIN <=16 SENSITIVE Sensitive     TRIMETH/SULFA <=20 SENSITIVE Sensitive     AMPICILLIN/SULBACTAM >=32 RESISTANT Resistant     PIP/TAZO <=4 SENSITIVE Sensitive     * >=100,000 COLONIES/mL ESCHERICHIA COLI  MRSA Next Gen by PCR, Nasal     Status: None   Collection Time: 08/31/21  6:41 AM   Specimen: Nasal Mucosa;  Nasal Swab  Result Value Ref Range Status   MRSA by PCR Next Gen NOT DETECTED NOT DETECTED Final    Comment: (NOTE) The GeneXpert MRSA Assay (FDA approved for NASAL specimens only), is one component of a comprehensive MRSA colonization surveillance program. It is not intended to diagnose MRSA infection nor to guide or monitor treatment for MRSA infections. Test performance is not FDA approved in patients less than 76 years old. Performed at [facility], [place] [address] [place], [place] [address]       Studies: No results found.   Flora Lipps, MD  [facility] 09/08/2021  If 7PM-7AM, please contact night-coverage

## 2021-09-09 ENCOUNTER — Inpatient Hospital Stay: Payer: Medicare Other | Attending: Adult Health

## 2021-09-09 ENCOUNTER — Inpatient Hospital Stay: Payer: Medicare Other | Admitting: Adult Health

## 2021-09-09 MED ORDER — FOLIC ACID 1 MG PO TABS
1.0000 mg | ORAL_TABLET | Freq: Every day | ORAL | Status: DC
  Administered 2021-09-10 – 2021-09-12 (×3): 1 mg via ORAL
  Filled 2021-09-09 (×3): qty 1

## 2021-09-09 MED ORDER — CHLORHEXIDINE GLUCONATE 0.12 % MT SOLN
OROMUCOSAL | Status: AC
  Filled 2021-09-09: qty 15

## 2021-09-09 MED ORDER — THIAMINE HCL 100 MG PO TABS
100.0000 mg | ORAL_TABLET | Freq: Every day | ORAL | Status: DC
  Administered 2021-09-10 – 2021-09-12 (×3): 100 mg via ORAL
  Filled 2021-09-09 (×3): qty 1

## 2021-09-09 NOTE — Progress Notes (Signed)
PROGRESS NOTE  [name] ZOX:[id number] DOB: [birth year] DOA: 08/30/2021 PCP: [name], MD   LOS: 9 days   Brief narrative:  Patient is a 66 years old female with history of alcohol dependence, arthritis, chronic heart failure, history of breast cancer, CKD stage III, diverticulitis, hypertension presented to the hospital with seizure-like activity.  She was minimally responsive in the ED so history was initially limited.  According to EMS, patient was initially very combative and uncooperative and 5 mg of Versed was given in route to the hospital.  Of note, there was history of alcohol abuse and was recently on an alcohol binge and suddenly stopped 3 days prior to presentation.  In the ED, patient had 2 separate episodes of tonic-clonic or seizure-like activity and was subsequently hypoxic, so bag and valve ventilation was done transitioning to nonrebreather mask.  Subsequently, patient had to be intubated and mechanically ventilated.  Patient was then transferred to the ICU.  Subsequently, the patient was considered stable for transfer out of the ICU.  Assessment/Plan:  Principal Problem:   Acute respiratory failure with hypoxia (HCC) Active Problems:   Essential hypertension   Alcoholic hepatitis   Morbid obesity with BMI of 60.0-69.9, adult (HCC)   Thrombocytopenia (HCC)   Chronic kidney disease, stage 3a (HCC)   Chronic diastolic CHF (congestive heart failure) (HCC)   Alcohol withdrawal seizure with delirium ([place])   UTI (urinary tract infection)   Alcohol withdrawal seizure ([place])   Cirrhosis of liver without ascites (HCC)  Acute respiratory failure due to encephalopathy from alcohol withdrawal and obtundation. Did have an aspiration event.  Currently resolved.  On room air.   Chronic alcoholism -likely relapse, most recently in remission May 2022.  Patient was agitated and encephalopathic with seizures on presentation and had to be intubated and mechanically ventilated.   Patient is very strongly motivated to quit alcohol and wishes to go to alcohol rehabilitation after discharge.  Continue clonidine patch at this time.   Urinary tract infection.   Patient received a 5-day course of Rocephin.    Mild hypokalemia and hypomagnesemia.  Has been replenished.  Check levels in a.m.  Essential HTN  Clonidine patch was added on 09/05/2021.  Continue hydralazine labetalol PRN. Continue amlodipine and Coreg    History of grade 2 diastolic dysfunction on echo April 2022  -  Currently Lasix on hold.   CKD4 - baseline creat 1.9, latest creatinine of 1.3.  We will continue to monitor.   Cirrhosis of liver- CT scan on July 2021 showed evidence of cirrhosis of liver.  Documented on CT abdomen July 2021.    Mild transamiitis and hyperbilirubinemia -  Likely secondary to cirrhosis of liver.  Right upper quadrant ultrasound showed no evidence of cholecystitis but sludge.  Chronic anemia -Baseline hemoglobin between 8.8 and 9.3 g.  Hemoglobin of 9.0 at this time.   New onset thrombocytopenia Resolved at this time.  Latest platelet count of 305.  Debility, deconditioning.  Physical therapy is seen the patient and recommended skilled nursing facility placement at this time.  Disposition.  Awaiting for skilled nursing facility placement.  Medically stable for disposition  DVT prophylaxis: heparin injection 5,000 Units Start: 08/31/21 0600 Place and maintain sequential compression device Start: 08/31/21 0509   Code Status: Full code  Family Communication: Spoke with the patient at bedside.  Status is: Inpatient  Remains inpatient appropriate because: Alcohol withdrawal seizures status post intubation, awaiting for skilled nursing facility placement.   Consultants: PCCM  Procedures:  Intubation and mechanical ventilation.  Anti-infectives:  IV Rocephin> completed  Subjective:  Today, patient was seen and examined at bedside.  Patient denies any nausea  vomiting abdominal pain shortness of breath cough or fever .  Objective: Vitals:   09/08/21 2023 09/09/21 0527  BP: (!) 142/66 (!) 156/65  Pulse: 76 83  Resp: 16 16  Temp: 99 F (37.2 C) 98.5 F (36.9 C)  SpO2: 98% 99%    Intake/Output Summary (Last 24 hours) at 09/09/2021 1031 Last data filed at 09/09/2021 0630 Gross per 24 hour  Intake 953 ml  Output 1525 ml  Net -572 ml    Filed Weights   09/06/21 2313 09/07/21 0339 09/08/21 0500  Weight: (!) 148 kg (!) 148 kg 128 kg   Body mass index is 53.32 kg/m.   Physical Exam:  General: Morbidly obese, not in obvious distress HENT:   No scleral pallor or icterus noted. Oral mucosa is moist.  Chest:  Clear breath sounds.  Diminished breath sounds bilaterally. No crackles or wheezes.  CVS: S1 &S2 heard. No murmur.  Regular rate and rhythm. Abdomen: Soft, nontender, obese abdomen, nondistended.  Bowel sounds are heard.   Extremities: No cyanosis, clubbing or edema.  Peripheral pulses are palpable. Psych: Alert, awake and oriented, normal mood CNS:  No cranial nerve deficits.  Power equal in all extremities.   Skin: Warm and dry.  No rashes noted.   Data Review: I have personally reviewed the following laboratory data and studies,  CBC: Recent Labs  Lab 09/03/21 0617 09/04/21 0701 09/05/21 0302 09/07/21 0428 09/08/21 1207  WBC 5.5 5.7 8.0 10.1 6.8  NEUTROABS 3.3 3.3  --   --   --   HGB 8.5* 9.2* 9.7* 9.1* 9.0*  HCT 26.8* 28.3* 30.3* 28.3* 28.3*  MCV 100.8* 99.3 99.3 99.3 100.0  PLT 100* 145* 193 287 782    Basic Metabolic Panel: Recent Labs  Lab 09/02/21 1645 09/03/21 0617 09/03/21 1620 09/04/21 0701 09/05/21 0302 09/07/21 0428 09/08/21 1207  NA  --  138  --  141 140 143 139  K  --  3.2*  --  3.7 3.8 3.5 3.4*  CL  --  102  --  110 110 109 105  CO2  --  25  --  22 22 25 25   GLUCOSE  --  128*  --  147* 122* 128* 114*  BUN  --  31*  --  38* 39* 28* 24*  CREATININE  --  2.25*  --  2.04* 1.74* 1.53* 1.34*   CALCIUM  --  8.3*  --  8.8* 8.9 9.0 8.5*  MG 2.0 2.0 2.0 1.8 1.7  --  1.5*  PHOS 5.0* 4.7* 4.1 3.6 3.7  --   --     Liver Function Tests: Recent Labs  Lab 09/03/21 0617 09/04/21 0701 09/05/21 0302 09/08/21 1207  AST 18 21 19  35  ALT 15 15 17 24   ALKPHOS 48 58 56 59  BILITOT 0.7 0.7 0.9 0.7  PROT 6.2* 7.1 7.3 6.9  ALBUMIN 2.6* 2.8* 3.1* 2.8*    No results for input(s): LIPASE, AMYLASE in the last 168 hours.  No results for input(s): AMMONIA in the last 168 hours. Cardiac Enzymes: No results for input(s): CKTOTAL, CKMB, CKMBINDEX, TROPONINI in the last 168 hours.  BNP (last 3 results) No results for input(s): BNP in the last 8760 hours.  ProBNP (last 3 results) No results for input(s): PROBNP in the last 8760 hours.  CBG:  Recent Labs  Lab 09/06/21 0739 09/06/21 1210 09/06/21 1514 09/06/21 1951 09/06/21 2316  GLUCAP 124* 111* 123* 124* 123*    Recent Results (from the past 240 hour(s))  Resp Panel by RT-PCR (Flu A&B, Covid) Nasopharyngeal Swab     Status: None   Collection Time: 08/31/21 12:01 AM   Specimen: Nasopharyngeal Swab; Nasopharyngeal(NP) swabs in vial transport medium  Result Value Ref Range Status   SARS Coronavirus 2 by RT PCR NEGATIVE NEGATIVE Final    Comment: (NOTE) SARS-CoV-2 target nucleic acids are NOT DETECTED.  The SARS-CoV-2 RNA is generally detectable in upper respiratory specimens during the acute phase of infection. The lowest concentration of SARS-CoV-2 viral copies this assay can detect is 138 copies/mL. A negative result does not preclude SARS-Cov-2 infection and should not be used as the sole basis for treatment or other patient management decisions. A negative result may occur with  improper specimen collection/handling, submission of specimen other than nasopharyngeal swab, presence of viral mutation(s) within the areas targeted by this assay, and inadequate number of viral copies(<138 copies/mL). A negative result must be  combined with clinical observations, patient history, and epidemiological information. The expected result is Negative.  Fact Sheet for Patients:  EntrepreneurPulse.com.au  Fact Sheet for Healthcare Providers:  IncredibleEmployment.be  This test is no t yet approved or cleared by the [place] FDA and  has been authorized for detection and/or diagnosis of SARS-CoV-2 by FDA under an Emergency Use Authorization (EUA). This EUA will remain  in effect (meaning this test can be used) for the duration of the COVID-19 declaration under Section 564(b)(1) of the Act, 21 U.S.C.section 360bbb-3(b)(1), unless the authorization is terminated  or revoked sooner.       Influenza A by PCR NEGATIVE NEGATIVE Final   Influenza B by PCR NEGATIVE NEGATIVE Final    Comment: (NOTE) The Xpert Xpress SARS-CoV-2/FLU/RSV plus assay is intended as an aid in the diagnosis of influenza from Nasopharyngeal swab specimens and should not be used as a sole basis for treatment. Nasal washings and aspirates are unacceptable for Xpert Xpress SARS-CoV-2/FLU/RSV testing.  Fact Sheet for Patients: EntrepreneurPulse.com.au  Fact Sheet for Healthcare Providers: IncredibleEmployment.be  This test is not yet approved or cleared by the [place] FDA and has been authorized for detection and/or diagnosis of SARS-CoV-2 by FDA under an Emergency Use Authorization (EUA). This EUA will remain in effect (meaning this test can be used) for the duration of the COVID-19 declaration under Section 564(b)(1) of the Act, 21 U.S.C. section 360bbb-3(b)(1), unless the authorization is terminated or revoked.  Performed at [facility], [place] [address] [place], [place] [address]   Urine Culture     Status: Abnormal   Collection Time: 08/31/21 12:44 AM   Specimen: Urine, Catheterized  Result Value Ref Range Status   Specimen  Description   Final    URINE, CATHETERIZED Performed at [place] [address] [place], [place] [address]    Special Requests   Final    NONE Performed at [facility], [place] [address], [place], [place] [address]    Culture >=100,000 COLONIES/mL ESCHERICHIA COLI (A)  Final   Report Status 09/02/2021 FINAL  Final   Organism ID, Bacteria ESCHERICHIA COLI (A)  Final      Susceptibility   Escherichia coli - MIC*    AMPICILLIN >=32 RESISTANT Resistant     CEFAZOLIN <=4 SENSITIVE Sensitive     CEFEPIME <=0.12 SENSITIVE Sensitive  CEFTRIAXONE <=0.25 SENSITIVE Sensitive     CIPROFLOXACIN <=0.25 SENSITIVE Sensitive     GENTAMICIN <=1 SENSITIVE Sensitive     IMIPENEM <=0.25 SENSITIVE Sensitive     NITROFURANTOIN <=16 SENSITIVE Sensitive     TRIMETH/SULFA <=20 SENSITIVE Sensitive     AMPICILLIN/SULBACTAM >=32 RESISTANT Resistant     PIP/TAZO <=4 SENSITIVE Sensitive     * >=100,000 COLONIES/mL ESCHERICHIA COLI  MRSA Next Gen by PCR, Nasal     Status: None   Collection Time: 08/31/21  6:41 AM   Specimen: Nasal Mucosa; Nasal Swab  Result Value Ref Range Status   MRSA by PCR Next Gen NOT DETECTED NOT DETECTED Final    Comment: (NOTE) The GeneXpert MRSA Assay (FDA approved for NASAL specimens only), is one component of a comprehensive MRSA colonization surveillance program. It is not intended to diagnose MRSA infection nor to guide or monitor treatment for MRSA infections. Test performance is not FDA approved in patients less than 10 years old. Performed at [facility], [place] [address], [place], [place] [address]      Studies: No results found.   Flora Lipps, MD  [facility] 09/09/2021  If 7PM-7AM, please contact night-coverage

## 2021-09-09 NOTE — TOC Progression Note (Addendum)
Transition of Care [facility]) - Progression Note    Patient Details  Name: [name] MRN: [id number] Date of Birth: 07/01/1956  Transition of Care [facility]) CM/SW Contact  Jeananne Bedwell, Marjie Skiff, RN Phone Number: 09/09/2021, 2:09 PM  Clinical Narrative:     TOC is continuing to have no SNF bed offers. Manual texts are being sent out to facilities for reconsideration.  1517: Pt received SNF bed offer from [facility]. Pt accepted SNF bed. [facility] alerted of bed acceptance. Shirlee Limerick to start insurance auth with [facility].  Expected Discharge Plan: Skilled Nursing Facility Barriers to Discharge: Continued Medical Work up  Expected Discharge Plan and Services Expected Discharge Plan: [place]   Discharge Planning Services: CM Consult   Living arrangements for the past 2 months: Apartment                          Social Determinants of Health (SDOH) Interventions    Readmission Risk Interventions Readmission Risk Prevention Plan 09/07/2021 03/06/2020 02/13/2020  Transportation Screening Complete Complete Complete  PCP or Specialist Appt within 3-5 Days - Not Complete -  Not Complete comments - Unsure of dc date at this time -  [place] or [place] - Complete -  Social Work Consult for [place] Planning/Counseling - Complete -  Palliative Care Screening - Not Applicable -  Medication Review Press photographer) Complete Complete Complete  PCP or Specialist appointment within 3-5 days of discharge Complete - Complete  HRI or Home Care Consult Complete - Complete  SW Recovery Care/Counseling Consult Complete - Complete  Palliative Care Screening Not Applicable - Complete  Skilled Nursing Facility Complete - Not Applicable  Some recent data might be hidden

## 2021-09-10 ENCOUNTER — Encounter: Payer: Self-pay | Admitting: Oncology

## 2021-09-10 LAB — BASIC METABOLIC PANEL
Anion gap: 12 (ref 5–15)
BUN: 28 mg/dL — ABNORMAL HIGH (ref 8–23)
CO2: 23 mmol/L (ref 22–32)
Calcium: 9.2 mg/dL (ref 8.9–10.3)
Chloride: 103 mmol/L (ref 98–111)
Creatinine, Ser: 1.17 mg/dL — ABNORMAL HIGH (ref 0.44–1.00)
GFR, Estimated: 51 mL/min — ABNORMAL LOW (ref >60)
Glucose, Bld: 100 mg/dL — ABNORMAL HIGH (ref 70–99)
Potassium: 4.5 mmol/L (ref 3.5–5.1)
Sodium: 138 mmol/L (ref 135–145)

## 2021-09-10 LAB — MAGNESIUM: Magnesium: 1.6 mg/dL — ABNORMAL LOW (ref 1.7–2.4)

## 2021-09-10 MED ORDER — ENSURE MAX PROTEIN PO LIQD
11.0000 [oz_av] | Freq: Two times a day (BID) | ORAL | Status: DC
  Administered 2021-09-10 – 2021-09-11 (×3): 11 [oz_av] via ORAL
  Filled 2021-09-10 (×5): qty 330

## 2021-09-10 NOTE — Progress Notes (Signed)
PROGRESS NOTE  [name] SEG:[id number] DOB: [birth year] DOA: 08/30/2021 PCP: [name], MD   LOS: 10 days   Brief narrative:  Patient is a 66 years old female with history of alcohol dependence, arthritis, chronic heart failure, history of breast cancer, CKD stage III, diverticulitis, hypertension presented to the hospital with seizure-like activity.  She was minimally responsive in the ED so history was initially limited.  According to EMS, patient was initially very combative and uncooperative and 5 mg of Versed was given in route to the hospital.  Of note, there was history of alcohol abuse and was recently on an alcohol binge and suddenly stopped 3 days prior to presentation.  In the ED, patient had 2 separate episodes of tonic-clonic or seizure-like activity and was subsequently hypoxic, so bag and valve ventilation was done transitioning to nonrebreather mask.  Subsequently, patient had to be intubated and mechanically ventilated.  Patient was then transferred to the ICU.  Subsequently, the patient was considered stable for transfer out of the ICU.  Assessment/Plan:  Principal Problem:   Acute respiratory failure with hypoxia (HCC) Active Problems:   Essential hypertension   Alcoholic hepatitis   Morbid obesity with BMI of 60.0-69.9, adult (HCC)   Thrombocytopenia (HCC)   Chronic kidney disease, stage 3a (HCC)   Chronic diastolic CHF (congestive heart failure) (HCC)   Alcohol withdrawal seizure with delirium ([place])   UTI (urinary tract infection)   Alcohol withdrawal seizure ([place])   Cirrhosis of liver without ascites (HCC)  Acute respiratory failure due to encephalopathy from alcohol withdrawal and obtundation. Did have an aspiration event.  Currently resolved.  On room air.   Chronic alcoholism -likely relapse, most recently in remission May 2022.  Patient was agitated and encephalopathic with seizures on presentation and had to be intubated and mechanically ventilated.   Patient is very strongly motivated to quit alcohol and wishes to go to alcohol rehabilitation at some point. Continue clonidine patch at this time.   Urinary tract infection.   Patient received a 5-day course of Rocephin.    Mild hypokalemia and hypomagnesemia.  Continue to replenish with oral magnesium oxide and potassium.  Essential HTN  Clonidine patch was added on 09/05/2021.  Continue hydralazine labetalol PRN. Continue amlodipine and Coreg    History of grade 2 diastolic dysfunction on echo April 2022  -  Currently Lasix on hold.  Will restart once electrolytes are better.   CKD4 - baseline creat 1.9, latest creatinine of 1.1.  We will continue to monitor.   Cirrhosis of liver- CT scan on July 2021 showed evidence of cirrhosis of liver.  Documented on CT abdomen July 2021.    Mild transamiitis and hyperbilirubinemia -  Likely secondary to cirrhosis of liver.  Right upper quadrant ultrasound showed no evidence of cholecystitis but sludge.  Chronic anemia -Baseline hemoglobin between 8.8 and 9.3 g.  Hemoglobin of 9.0 at this time.   New onset thrombocytopenia Resolved at this time.  Latest platelet count of 305.  Debility, deconditioning.  Physical therapy has seen the patient and recommended skilled nursing facility placement at this time.  Disposition.  Awaiting for skilled nursing facility placement.  Medically stable for disposition  DVT prophylaxis: heparin injection 5,000 Units Start: 08/31/21 0600 Place and maintain sequential compression device Start: 08/31/21 0509   Code Status: Full code  Family Communication:  Spoke with the patient at bedside.  Status is: Inpatient  Remains inpatient appropriate because: Alcohol withdrawal seizures status post intubation, awaiting for skilled  nursing facility placement.   Consultants: PCCM  Procedures: Intubation and mechanical ventilation.  Anti-infectives:  IV Rocephin> completed  Subjective:  Today, patient  was seen and examined at bedside.  Patient denies interval complaints.  Denies any nausea vomiting fever chills or rigor.  Denies any tremors sweating.    Objective: Vitals:   09/09/21 2048 09/10/21 0434  BP: 126/61 (!) 170/82  Pulse: 82 76  Resp: 14 14  Temp: 98.8 F (37.1 C) 98.5 F (36.9 C)  SpO2: 95% 99%    Intake/Output Summary (Last 24 hours) at 09/10/2021 1303 Last data filed at 09/10/2021 1000 Gross per 24 hour  Intake 245 ml  Output 1050 ml  Net -805 ml    Filed Weights   09/07/21 0339 09/08/21 0500 09/10/21 0434  Weight: (!) 148 kg 128 kg (!) 148 kg   Body mass index is 61.65 kg/m.   Physical Exam:  General: Morbidly obese, not in obvious distress HENT:   No scleral pallor or icterus noted. Oral mucosa is moist.  Chest:  Diminished breath sounds bilaterally. No crackles or wheezes.  CVS: S1 &S2 heard. No murmur.  Regular rate and rhythm. Abdomen: Soft, nontender, obese, nondistended.  Bowel sounds are heard.   Extremities: No cyanosis, clubbing or edema.  Peripheral pulses are palpable. Psych: Alert, awake and oriented, normal mood CNS:  No cranial nerve deficits.  Power equal in all extremities.   Skin: Warm and dry.  No rashes noted.  Data Review: I have personally reviewed the following laboratory data and studies,  CBC: Recent Labs  Lab 09/04/21 0701 09/05/21 0302 09/07/21 0428 09/08/21 1207  WBC 5.7 8.0 10.1 6.8  NEUTROABS 3.3  --   --   --   HGB 9.2* 9.7* 9.1* 9.0*  HCT 28.3* 30.3* 28.3* 28.3*  MCV 99.3 99.3 99.3 100.0  PLT 145* 193 287 967    Basic Metabolic Panel: Recent Labs  Lab 09/03/21 1620 09/04/21 0701 09/05/21 0302 09/07/21 0428 09/08/21 1207 09/10/21 0412  NA  --  141 140 143 139 138  K  --  3.7 3.8 3.5 3.4* 4.5  CL  --  110 110 109 105 103  CO2  --  22 22 25 25 23   GLUCOSE  --  147* 122* 128* 114* 100*  BUN  --  38* 39* 28* 24* 28*  CREATININE  --  2.04* 1.74* 1.53* 1.34* 1.17*  CALCIUM  --  8.8* 8.9 9.0 8.5* 9.2  MG  2.0 1.8 1.7  --  1.5* 1.6*  PHOS 4.1 3.6 3.7  --   --   --     Liver Function Tests: Recent Labs  Lab 09/04/21 0701 09/05/21 0302 09/08/21 1207  AST 21 19 35  ALT 15 17 24   ALKPHOS 58 56 59  BILITOT 0.7 0.9 0.7  PROT 7.1 7.3 6.9  ALBUMIN 2.8* 3.1* 2.8*    No results for input(s): LIPASE, AMYLASE in the last 168 hours.  No results for input(s): AMMONIA in the last 168 hours. Cardiac Enzymes: No results for input(s): CKTOTAL, CKMB, CKMBINDEX, TROPONINI in the last 168 hours.  BNP (last 3 results) No results for input(s): BNP in the last 8760 hours.  ProBNP (last 3 results) No results for input(s): PROBNP in the last 8760 hours.  CBG: Recent Labs  Lab 09/06/21 0739 09/06/21 1210 09/06/21 1514 09/06/21 1951 09/06/21 2316  GLUCAP 124* 111* 123* 124* 123*    No results found for this or any previous visit (from  the past 240 hour(s)).    Studies: No results found.   Flora Lipps, MD  [facility] 09/10/2021  If 7PM-7AM, please contact night-coverage

## 2021-09-10 NOTE — Progress Notes (Signed)
Nutrition Follow-up  DOCUMENTATION CODES:   Morbid obesity  INTERVENTION:   -Ensure MAX Protein po BID, each supplement provides 150 kcal and 30 grams of protein    NUTRITION DIAGNOSIS:   Inadequate oral intake related to inability to eat as evidenced by NPO status.  Now on dysphagia 3  GOAL:   Patient will meet greater than or equal to 90% of their needs  Progressing.  MONITOR:   PO intake, Supplement acceptance, Labs, Weight trends, I & O's  REASON FOR ASSESSMENT:   Consult Assessment of nutrition requirement/status  ASSESSMENT:   66 year old female with medical history of HTN, alcohol abuse, stage 3 invasive ductal carcinoma of the L breast (dx 10/2019 s/p radiacal mastectomy 11/2019 with adjuctive chemoradiation complete 04/2020), stage 3 CKD, CHF, and anemia of chronic disease. She presented to the ED via EMS after witnessed seizure-like activity. Family reported to EMS staff that patient has alcohol abuse and was recently on an alcohol binge but abruptly stopped drinking 3 days prior. In the ED she had 2 separate 10-second episodes of tonic-clonic seizure activity.  RD received consult for assessment for patient. Reviewed SLP note, pt reported needing to eat soft foods given dentures causing discomfort. Pt reported to SLP that she did not eat much this morning for breakfast. Per RN and tech, pt ate around 30% of her breakfast this morning (french toast, sausage, OJ). Per tech, pt has typically been consuming 100% of her meals prior to today.  Pt reports not finishing her breakfast but reports she is hungry. She likes the Ensure supplements and would be okay increasing them to BID to help her meet her protein needs.  Admission weight: 304 lbs. Current weight: 326 lbs  I/Os: -760 ml since admit UOP: 1050 ml x 24 hrs  Medications: Folic acid, MAG-OX, Multivitamin with minerals daily, KLOR-CON, Thiamine   Labs reviewed: CBGs: 123-124 Low Mg  Diet Order:   Diet  Order             DIET DYS 3 Room service appropriate? Yes; Fluid consistency: Thin  Diet effective now                   EDUCATION NEEDS:   No education needs have been identified at this time  Skin:  Skin Assessment: Reviewed RN Assessment  Last BM:  1/11 -type 5  Height:   Ht Readings from Last 1 Encounters:  08/31/21 5\' 1"  (1.549 m)    Weight:   Wt Readings from Last 1 Encounters:  09/10/21 (!) 148 kg    BMI:  Body mass index is 61.65 kg/m.  Estimated Nutritional Needs:   Kcal:  1700-1900  Protein:  105-120g  Fluid:  1.7L/day  Clayton Bibles, MS, RD, LDN Inpatient Clinical Dietitian Contact information available via Amion

## 2021-09-10 NOTE — Progress Notes (Signed)
Speech Language Pathology Treatment: Dysphagia  Patient Details Name: [name] MRN: [id number] DOB: April 09, 1956 Today's Date: 09/10/2021 Time: 0340-3524 SLP Time Calculation (min) (ACUTE ONLY): 15 min  Assessment / Plan / Recommendation Clinical Impression  Pt seen to address dysphagia goals. She has dentures in room but admits she does not use them to eat due to discomfort - thus SLP advised she have family take them home to prevent loss if she does not want to use them. She advises that she needs to eat softer foods- reports her brocolli was too hard to her to masticate yesterday. She furhter states she is ordering foods that they are not sending her.  SLP observed pt with water - no indications of dysphagia/aspiration and voice currently as strong as baseline.  SLP educated pt to importance of oral care using toothbrush /paste and provided her with supplies.  Using teach back, she was educated.  Reverse trendelenburg position conducted to maximize her safety with po and SlP demonstrated how to modify bed for safety.  [name] advised that her intake is poor and  not much breakfast was consumed - therefore recommended RD consult to help maximize nutrition.  No SlP follow up indicated as all education completed and pt tolerating her current diet.  Thanks.    HPI HPI: Pt admitted from home by Ambulance with delirium and acute hypoxic respiratory failure associated with ETOH withdrawal.  Pt required intubation 1/2 and was extubated 09/04/21 pm.  Pt with hx of ETOH abuse, CHF, morbid obesity, CKD Stage 3, thoracic aortic aneurysm, and breast CA s/p L mastectomy.   Swallow evaluation order received. Pt denies dysphagia, voice is hoarse - but improving per pt and RN.  CXR 09/04/21 . "There is improvement in aeration of  right lower lung fields suggesting decrease in  atelectasis/pneumonia. Small right pleural effusion."  Pt was placed on dys3/nectar diet initially and then advanced to thin. Follow up indicated  for dysphagia goals/management.      SLP Plan  All goals met      Recommendations for follow up therapy are one component of a multi-disciplinary discharge planning process, led by the attending physician.  Recommendations may be updated based on patient status, additional functional criteria and insurance authorization.    Recommendations  Diet recommendations: Dysphagia 3 (mechanical soft);Thin liquid Liquids provided via: Cup;Straw Medication Administration: Whole meds with liquid Supervision: Patient able to self feed Compensations: Slow rate;Small sips/bites;Other (Comment) Postural Changes and/or Swallow Maneuvers: Seated upright 90 degrees (as upright as possible, reverse trendelenburg may be helpful)                Oral Care Recommendations: Oral care BID Follow Up Recommendations: No SLP follow up Assistance recommended at discharge: Intermittent Supervision/Assistance SLP Visit Diagnosis: Dysphagia, unspecified (R13.10) Plan: All goals met          Sue Lime, MS [facility] SLP [place] Office [phone] Cell [phone]  Macario Golds  09/10/2021, 10:28 AM

## 2021-09-11 ENCOUNTER — Encounter: Payer: Self-pay | Admitting: Oncology

## 2021-09-11 MED ORDER — TRAMADOL HCL 50 MG PO TABS: 50.0000 mg | ORAL_TABLET | Freq: Three times a day (TID) | ORAL | Status: DC | PRN

## 2021-09-11 MED ORDER — FUROSEMIDE 20 MG PO TABS
20.0000 mg | ORAL_TABLET | Freq: Every day | ORAL | Status: DC
  Administered 2021-09-11 – 2021-09-12 (×2): 20 mg via ORAL
  Filled 2021-09-11 (×2): qty 1

## 2021-09-11 MED ORDER — BIOTIN 10000 MCG PO TABS: 10000.0000 ug | ORAL_TABLET | Freq: Every day | ORAL | Status: DC

## 2021-09-11 MED ORDER — ANASTROZOLE 1 MG PO TABS
1.0000 mg | ORAL_TABLET | Freq: Every day | ORAL | Status: DC
  Administered 2021-09-12: 1 mg via ORAL
  Filled 2021-09-11: qty 1

## 2021-09-11 MED ORDER — ASPIRIN 81 MG PO TBEC: 81.0000 mg | DELAYED_RELEASE_TABLET | Freq: Every day | ORAL | Status: DC

## 2021-09-11 MED ORDER — GABAPENTIN 300 MG PO CAPS
300.0000 mg | ORAL_CAPSULE | Freq: Every day | ORAL | Status: DC
  Administered 2021-09-11: 300 mg via ORAL
  Filled 2021-09-11: qty 1

## 2021-09-11 NOTE — Plan of Care (Signed)
°  Problem: Education: Goal: Knowledge of General Education information will improve Description: Including pain rating scale, medication(s)/side effects and non-pharmacologic comfort measures Outcome: Progressing   Problem: Health Behavior/Discharge Planning: Goal: Ability to manage health-related needs will improve Outcome: Progressing   Problem: Clinical Measurements: Goal: Ability to maintain clinical measurements within normal limits will improve Outcome: Progressing Goal: Will remain free from infection Outcome: Progressing Goal: Diagnostic test results will improve Outcome: Progressing Goal: Respiratory complications will improve Outcome: Progressing Goal: Cardiovascular complication will be avoided Outcome: Progressing   Problem: Activity: Goal: Risk for activity intolerance will decrease Outcome: Progressing   Problem: Nutrition: Goal: Adequate nutrition will be maintained Outcome: Progressing   Problem: Coping: Goal: Level of anxiety will decrease Outcome: Progressing   Problem: Elimination: Goal: Will not experience complications related to bowel motility Outcome: Progressing Goal: Will not experience complications related to urinary retention Outcome: Progressing   Problem: Pain Managment: Goal: General experience of comfort will improve Outcome: Progressing   Problem: Skin Integrity: Goal: Risk for impaired skin integrity will decrease Outcome: Progressing   Problem: Safety: Goal: Non-violent Restraint(s) Outcome: Progressing

## 2021-09-11 NOTE — Progress Notes (Signed)
PROGRESS NOTE  [name] KYH:[id number] DOB: May 28, 1956 DOA: 08/30/2021 PCP: [name], MD   LOS: 11 days   Brief narrative:  Patient is a 66 years old female with history of alcohol dependence, arthritis, chronic heart failure, history of breast cancer, CKD stage III, diverticulitis, hypertension presented to the hospital with seizure-like activity.  She was minimally responsive in the ED so history was initially limited.  According to EMS, patient was initially very combative and uncooperative and 5 mg of Versed was given in route to the hospital.  Of note, there was history of alcohol abuse and was recently on an alcohol binge and suddenly stopped 3 days prior to presentation.  In the ED, patient had 2 separate episodes of tonic-clonic or seizure-like activity and was subsequently hypoxic, so bag and valve ventilation was done transitioning to nonrebreather mask.  Subsequently, patient had to be intubated and mechanically ventilated.  Patient was then transferred to the ICU.  Subsequently, the patient was considered stable for transfer out of the ICU.  Assessment/Plan:  Principal Problem:   Acute respiratory failure with hypoxia (HCC) Active Problems:   Essential hypertension   Alcoholic hepatitis   Morbid obesity with BMI of 60.0-69.9, adult (HCC)   Thrombocytopenia (HCC)   Chronic kidney disease, stage 3a (HCC)   Chronic diastolic CHF (congestive heart failure) (HCC)   Alcohol withdrawal seizure with delirium ([place])   UTI (urinary tract infection)   Alcohol withdrawal seizure ([place])   Cirrhosis of liver without ascites (HCC)  Acute respiratory failure due to encephalopathy from alcohol withdrawal and obtundation.  Did have an aspiration event.  Currently resolved.  On room air.   Chronic alcoholism -likely relapse, most recently in remission May 2022.  Patient was agitated and encephalopathic with seizures on presentation and had to be intubated and mechanically  ventilated.  Patient is very strongly motivated to quit alcohol and wishes to go to alcohol rehabilitation at some point. Continue clonidine patch at this time.  Currently at baseline and is stable  Urinary tract infection.   Patient received a 5-day course of Rocephin.    Mild hypokalemia.  Improved after replacement.  Hypomagnesemia.  Continue to replenish with oral magnesium oxide   Essential HTN  Continue hydralazine labetalol PRN. Continue amlodipine, Coreg and clonidine patch.   History of grade 2 diastolic dysfunction on echo April 2022  -  Will restart low-dose Lasix from today.   CKD4 - baseline creat 1.9, latest creatinine of 1.1.  We will continue to monitor.   Cirrhosis of liver- CT scan on July 2021 showed evidence of cirrhosis of liver.  Documented on CT abdomen July 2021.    Mild transamiitis and hyperbilirubinemia -  Likely secondary to cirrhosis of liver.  Right upper quadrant ultrasound showed no evidence of cholecystitis but sludge.  Chronic anemia -Baseline hemoglobin between 8.8 and 9.3 g.  Latest hemoglobin of 9.0 at this time.  Thrombocytopenia Resolved at this time.  Latest platelet count of 305.  Debility, deconditioning.  Awaiting for skilled nursing facility  History of left breast cancer.  Resume Arimidex  Disposition.  Awaiting for skilled nursing facility placement.  Medically stable for disposition  DVT prophylaxis: heparin injection 5,000 Units Start: 08/31/21 0600 Place and maintain sequential compression device Start: 08/31/21 0509   Code Status: Full code  Family Communication:  Spoke with the patient at bedside.  Status is: Inpatient  Remains inpatient appropriate because:  awaiting for skilled nursing facility placement.   Consultants: PCCM  Procedures:  Intubation and mechanical ventilation.  Anti-infectives:  IV Rocephin> completed  Subjective:  Today, patient was seen and examined at bedside.  Patient denies any nausea  vomiting fever chills or rigor.   Objective: Vitals:   09/11/21 0547 09/11/21 1017  BP: (!) 154/81 (!) 156/71  Pulse: 71 75  Resp: 14   Temp: 98.3 F (36.8 C)   SpO2: 96%     Intake/Output Summary (Last 24 hours) at 09/11/2021 1244 Last data filed at 09/11/2021 1218 Gross per 24 hour  Intake 236 ml  Output 2700 ml  Net -2464 ml    Filed Weights   09/08/21 0500 09/10/21 0434 09/11/21 0547  Weight: 128 kg (!) 148 kg 104 kg   Body mass index is 43.32 kg/m.   Physical Exam:  General: Morbidly obese,, not in obvious distress HENT:   No scleral pallor or icterus noted. Oral mucosa is moist.  Chest:    Diminished breath sounds bilaterally. No crackles or wheezes.  CVS: S1 &S2 heard. No murmur.  Regular rate and rhythm. Abdomen: Soft, nontender, nondistended.  Obese, bowel sounds are heard.   Extremities: No cyanosis, clubbing or edema.  Peripheral pulses are palpable. Psych: Alert, awake and oriented, normal mood CNS:  No cranial nerve deficits.  Power equal in all extremities.   Skin: Warm and dry.  No rashes noted.  Data Review: I have personally reviewed the following laboratory data and studies,  CBC: Recent Labs  Lab 09/05/21 0302 09/07/21 0428 09/08/21 1207  WBC 8.0 10.1 6.8  HGB 9.7* 9.1* 9.0*  HCT 30.3* 28.3* 28.3*  MCV 99.3 99.3 100.0  PLT 193 287 315    Basic Metabolic Panel: Recent Labs  Lab 09/05/21 0302 09/07/21 0428 09/08/21 1207 09/10/21 0412  NA 140 143 139 138  K 3.8 3.5 3.4* 4.5  CL 110 109 105 103  CO2 22 25 25 23   GLUCOSE 122* 128* 114* 100*  BUN 39* 28* 24* 28*  CREATININE 1.74* 1.53* 1.34* 1.17*  CALCIUM 8.9 9.0 8.5* 9.2  MG 1.7  --  1.5* 1.6*  PHOS 3.7  --   --   --     Liver Function Tests: Recent Labs  Lab 09/05/21 0302 09/08/21 1207  AST 19 35  ALT 17 24  ALKPHOS 56 59  BILITOT 0.9 0.7  PROT 7.3 6.9  ALBUMIN 3.1* 2.8*    No results for input(s): LIPASE, AMYLASE in the last 168 hours.  No results for input(s):  AMMONIA in the last 168 hours. Cardiac Enzymes: No results for input(s): CKTOTAL, CKMB, CKMBINDEX, TROPONINI in the last 168 hours.  BNP (last 3 results) No results for input(s): BNP in the last 8760 hours.  ProBNP (last 3 results) No results for input(s): PROBNP in the last 8760 hours.  CBG: Recent Labs  Lab 09/06/21 0739 09/06/21 1210 09/06/21 1514 09/06/21 1951 09/06/21 2316  GLUCAP 124* 111* 123* 124* 123*    No results found for this or any previous visit (from the past 240 hour(s)).    Studies: No results found.   Flora Lipps, MD  [facility] 09/11/2021  If 7PM-7AM, please contact night-coverage

## 2021-09-11 NOTE — TOC Progression Note (Addendum)
Transition of Care [facility]) - Progression Note    Patient Details  Name: [name] MRN: [id number] Date of Birth: 05-23-1956  [facility] CM/SW Contact  [place] Uher, Marjie Skiff, RN Phone Number: 09/11/2021, 1:49 PM  Clinical Narrative:    [place] for SNF auth is not working correctly. Manual auth initiated via phone [id number]. Clinicals faxed to [phone]. Per Shirlee Limerick at [facility] pt can come on Saturday if auth is back.   1503: [facility] approved SNF 1/13-1/17 [id number]. Covid test ordered. Per Shirlee Limerick at [facility] just needs to call her in the morning when the DC summary is ready.   Expected Discharge Plan: Skilled Nursing Facility Barriers to Discharge: Continued Medical Work up  Expected Discharge Plan and Services Expected Discharge Plan: [place]   Discharge Planning Services: CM Consult   Living arrangements for the past 2 months: Apartment                     Social Determinants of Health (SDOH) Interventions    Readmission Risk Interventions Readmission Risk Prevention Plan 09/07/2021 03/06/2020 02/13/2020  Transportation Screening Complete Complete Complete  PCP or Specialist Appt within 3-5 Days - Not Complete -  Not Complete comments - Unsure of dc date at this time -  [place] or [place] - Complete -  Social Work Consult for [place] Planning/Counseling - Complete -  Palliative Care Screening - Not Applicable -  Medication Review Press photographer) Complete Complete Complete  PCP or Specialist appointment within 3-5 days of discharge Complete - Complete  HRI or Home Care Consult Complete - Complete  SW Recovery Care/Counseling Consult Complete - Complete  Palliative Care Screening Not Applicable - Complete  Skilled Nursing Facility Complete - Not Applicable  Some recent data might be hidden

## 2021-09-11 NOTE — Progress Notes (Signed)
Physical Therapy Treatment Patient Details Name: [name] MRN: [id number] DOB: 1956/05/19 Today's Date: 09/11/2021   History of Present Illness Pt admitted from home by Ambulance 2* seires with delirium and acute hypoxic respiratory failure associated with ETOH withdrawal.  Pt required intubation and was extubated 09/04/21.  Pt with hx of ETOH abuse, CHF, morbid obesity, CKD Stage 3, thoracic aortic aneurysm, and breast CA s/p L mastectomy    PT Comments    Patient is highly motivated and making good progress with mobility. 2+ mod assist for safety and physical assist needed to complete bed mobility on air mattress. Min +2 assist to power up from deflated EOB with RW. Pt able to step to recliner with RW and +2 Min assist for safety. She will benefit from [place] [facility] at SNF setting to progress independence and safety with mobility. Acute PT will continue to progress as able.     Recommendations for follow up therapy are one component of a multi-disciplinary discharge planning process, led by the attending physician.  Recommendations may be updated based on patient status, additional functional criteria and insurance authorization.  Follow Up Recommendations  Skilled nursing-short term rehab (<3 hours/day)     Assistance Recommended at Discharge Frequent or constant Supervision/Assistance  Patient can return home with the following     Equipment Recommendations  None recommended by PT    Recommendations for Other Services       Precautions / Restrictions Precautions Precautions: Fall Restrictions Weight Bearing Restrictions: No     Mobility  Bed Mobility Overal bed mobility: Needs Assistance Bed Mobility: Rolling;Sidelying to Sit Rolling: Min assist;+2 for safety/equipment;+2 for physical assistance Sidelying to sit: Mod assist;+2 for safety/equipment;+2 for physical assistance       General bed mobility comments: +2 for safety and Min-Mod assist to roll with Mod Assist +2  and use of bed pad to pivot hips and raise trunk up to sit EOB. bed deflated and lowered for safety.    Transfers Overall transfer level: Needs assistance Equipment used: Rolling walker (2 wheels) Transfers: Sit to/from Stand;Bed to chair/wheelchair/BSC Sit to Stand: Min assist;+2 safety/equipment;+2 physical assistance     Step pivot transfers: Min assist;Mod assist;+2 physical assistance;+2 safety/equipment     General transfer comment: Min +2 assist for power up from deflated EOB and Min-Mod assist to manage RW and sequence steps to recliner. No overt LOB noted, pt with wide stance and steps to side step to chair.    Ambulation/Gait                   Stairs             Wheelchair Mobility    Modified Rankin (Stroke Patients Only)       Balance Overall balance assessment: Needs assistance Sitting-balance support: Single extremity supported;Feet unsupported Sitting balance-Leahy Scale: Fair     Standing balance support: During functional activity;Bilateral upper extremity supported;Reliant on assistive device for balance Standing balance-Leahy Scale: Poor                              Cognition Arousal/Alertness: Awake/alert Behavior During Therapy: WFL for tasks assessed/performed Overall Cognitive Status: Within Functional Limits for tasks assessed                                          Exercises  General Comments        Pertinent Vitals/Pain Pain Assessment: No/denies pain    Home Living                          Prior Function            PT Goals (current goals can now be found in the care plan section) Acute Rehab PT Goals Patient Stated Goal: Regain IND PT Goal Formulation: With patient Time For Goal Achievement: 09/19/21 Potential to Achieve Goals: Fair Progress towards PT goals: Progressing toward goals    Frequency    Min 3X/week      PT Plan Current plan remains  appropriate    Co-evaluation              AM-PAC PT "6 Clicks" Mobility   Outcome Measure  Help needed turning from your back to your side while in a flat bed without using bedrails?: A Lot Help needed moving from lying on your back to sitting on the side of a flat bed without using bedrails?: A Lot Help needed moving to and from a bed to a chair (including a wheelchair)?: A Lot Help needed standing up from a chair using your arms (e.g., wheelchair or bedside chair)?: A Lot Help needed to walk in hospital room?: A Lot Help needed climbing 3-5 steps with a railing? : Total 6 Click Score: 11    End of Session Equipment Utilized During Treatment: Gait belt Activity Tolerance: Patient tolerated treatment well;Patient limited by pain Patient left: in chair;with call bell/phone within reach;with chair alarm set Nurse Communication: Mobility status PT Visit Diagnosis: Difficulty in walking, not elsewhere classified (R26.2);Muscle weakness (generalized) (M62.81);Pain     Time: 2263-3354 PT Time Calculation (min) (ACUTE ONLY): 25 min  Charges:  $Therapeutic Activity: 23-37 mins                     Verner Mould, DPT Acute Rehabilitation Services Office [phone] Pager [phone]    Jacques Navy 09/11/2021, 1:28 PM

## 2021-09-11 NOTE — Progress Notes (Signed)
PHARMACIST - PHYSICIAN ORDER COMMUNICATION  CONCERNING: P&T Medication Policy on Herbal Medications  DESCRIPTION:  This patients order for:  Biotin has been noted.  This product(s) is classified as an herbal or natural product. Due to a lack of definitive safety studies or FDA approval, nonstandard manufacturing practices, plus the potential risk of unknown drug-drug interactions while on inpatient medications, the Pharmacy and Therapeutics Committee does not permit the use of herbal or natural products of this type within [facility].   ACTION TAKEN: The pharmacy department is unable to verify this order at this time and your patient has been informed of this safety policy. Please reevaluate patients clinical condition at discharge and address if the herbal or natural product(s) should be resumed at that time.   Lindell Spar, PharmD, BCPS 09/11/2021 12:56 PM

## 2021-09-12 LAB — SARS CORONAVIRUS 2 BY RT PCR (HOSPITAL ORDER, PERFORMED IN ~~LOC~~ HOSPITAL LAB): SARS Coronavirus 2: NEGATIVE

## 2021-09-12 MED ORDER — ADULT MULTIVITAMIN W/MINERALS CH: 1.0000 | ORAL_TABLET | Freq: Every day | ORAL | Status: DC

## 2021-09-12 MED ORDER — ALBUTEROL SULFATE HFA 108 (90 BASE) MCG/ACT IN AERS: 2.0000 | INHALATION_SPRAY | Freq: Four times a day (QID) | RESPIRATORY_TRACT | 2 refills | Status: DC | PRN

## 2021-09-12 MED ORDER — NYSTATIN 100000 UNIT/GM EX POWD: Freq: Three times a day (TID) | CUTANEOUS | 0 refills | Status: DC

## 2021-09-12 MED ORDER — THIAMINE HCL 100 MG PO TABS: 100.0000 mg | ORAL_TABLET | Freq: Every day | ORAL | Status: DC

## 2021-09-12 MED ORDER — MAGNESIUM OXIDE -MG SUPPLEMENT 400 (240 MG) MG PO TABS: 400.0000 mg | ORAL_TABLET | Freq: Two times a day (BID) | ORAL | 0 refills | Status: DC

## 2021-09-12 MED ORDER — ENSURE MAX PROTEIN PO LIQD: 11.0000 [oz_av] | Freq: Two times a day (BID) | ORAL | Status: DC

## 2021-09-12 MED ORDER — CLONIDINE 0.2 MG/24HR TD PTWK: 0.2000 mg | MEDICATED_PATCH | TRANSDERMAL | Status: DC

## 2021-09-12 MED ORDER — AMLODIPINE BESYLATE 10 MG PO TABS: 10.0000 mg | ORAL_TABLET | Freq: Every day | ORAL | Status: DC

## 2021-09-12 NOTE — Discharge Summary (Signed)
Physician Discharge Summary  [name] OZH:[id number] DOB: 03-16-1956 DOA: 08/30/2021  PCP: [name], MD  Admit date: 08/30/2021 Discharge date: 09/12/2021  Admitted From: Home  Discharge disposition: SNF  Recommendations for Outpatient Follow-Up:   Follow up with your primary care provider at the Skilled nursing facility in 3-5 days Check CBC, BMP, magnesium in the next visit  Discharge Diagnosis:   Principal Problem:   Acute respiratory failure with hypoxia ([place]) Active Problems:   Essential hypertension   Alcoholic hepatitis   Morbid obesity with BMI of 60.0-69.9, adult (HCC)   Thrombocytopenia (HCC)   Chronic kidney disease, stage 3a (HCC)   Chronic diastolic CHF (congestive heart failure) (HCC)   Alcohol withdrawal seizure with delirium ([place])   UTI (urinary tract infection)   Alcohol withdrawal seizure ([place])   Cirrhosis of liver without ascites ([place])  Discharge Condition: Improved.  Diet recommendation: Low sodium, heart healthy.  Mechanical soft diet  Wound care: None.  Code status: Full.   History of Present Illness:   Patient is a 66 years old female with history of alcohol dependence, arthritis, chronic heart failure, history of breast cancer, CKD stage III, diverticulitis, hypertension presented to the hospital with seizure-like activity.  She was minimally responsive in the ED so history was initially limited.  According to EMS, patient was initially very combative and uncooperative and 5 mg of Versed was given in route to the hospital.  Of note, there was history of alcohol abuse and was recently on an alcohol binge and suddenly stopped 3 days prior to presentation.  In the ED, patient had 2 separate episodes of tonic-clonic or seizure-like activity and was subsequently hypoxic, so bag and valve ventilation was done transitioning to nonrebreather mask.  Subsequently, patient was intubated and mechanically ventilated.  Patient was then transferred to  the ICU.  Subsequently, the patient was considered stable for transfer out of the ICU.  Hospital Course:   Following conditions were addressed during hospitalization as listed below,  Acute respiratory failure due to encephalopathy from alcohol withdrawal and obtundation.  Did have an aspiration event.  Currently resolved.  On room air.   Chronic alcoholism -likely relapse, most recently in remission May 2022.  Patient was agitated and encephalopathic with seizures on presentation and had to be intubated and mechanically ventilated.  Patient is very strongly motivated to quit alcohol and wishes to go to alcohol rehabilitation at some point. Continue clonidine patch at this time.  Currently at baseline and is stable   Urinary tract infection.   Patient received a 5-day course of Rocephin during hospitalization..     Mild hypokalemia.  Improved after replacement.  Potassium prior to discharge was 4.5.   Hypomagnesemia.  Continue to replenish with oral magnesium oxide on discharge.  Latest magnesium 1.6.   Essential HTN   Continue amlodipine, Coreg and clonidine patch from home.   History of grade 2 diastolic dysfunction on echo April 2022  -  Resumed on low-dose Lasix.   CKD4 - baseline creat 1.9, latest creatinine of 1.1.  Follow-up as outpatient.   Cirrhosis of liver- CT scan on July 2021 showed evidence of cirrhosis of liver.  Documented on CT abdomen July 2021.    Mild transamiitis and hyperbilirubinemia -  Likely secondary to cirrhosis of liver.  Right upper quadrant ultrasound showed no evidence of cholecystitis but sludge.  No acute issues at this time   Chronic anemia -Baseline hemoglobin between 8.8 and 9.3 g.  Latest hemoglobin of 9.0  at this time.   Thrombocytopenia Resolved at this time.  Latest platelet count of 305.   Debility, deconditioning.  For skilled nursing facility placement.   History of left breast cancer.  Resume Arimidex   Disposition.  At this time,  patient is stable for disposition to skilled nursing facility today.  Medical Consultants:   PCCM  Procedures:    Intubation and mechanical ventilation. Subjective:   Today, patient was seen and examined at bedside.  Feels okay.  Denies any interval complaints.  No nausea vomiting fever chills or rigor.    Discharge Exam:   Vitals:   09/11/21 2025 09/12/21 0558  BP: 129/66 (!) 170/75  Pulse: 67 72  Resp: 17 17  Temp: 98.9 F (37.2 C) 98.7 F (37.1 C)  SpO2:  93%   Vitals:   09/11/21 1410 09/11/21 1838 09/11/21 2025 09/12/21 0558  BP: 121/80 113/77 129/66 (!) 170/75  Pulse: 72 74 67 72  Resp: 17  17 17   Temp: 98.2 F (36.8 C)  98.9 F (37.2 C) 98.7 F (37.1 C)  TempSrc: Oral  Oral Oral  SpO2: 99%   93%  Weight:    100 kg  Height:       General: Alert awake, not in obvious distress, morbidly obese HENT: pupils equally reacting to light,  No scleral pallor or icterus noted. Oral mucosa is moist.  Chest:    Diminished breath sounds bilaterally.  CVS: S1 &S2 heard. No murmur.  Regular rate and rhythm. Abdomen: Soft, obese, nontender, nondistended.  Bowel sounds are heard.   Extremities: No cyanosis, clubbing or edema.  Peripheral pulses are palpable. Psych: Alert, awake and oriented, normal mood CNS:  No cranial nerve deficits.  Power equal in all extremities.   Skin: Warm and dry.  No rashes noted.  The results of significant diagnostics from this hospitalization (including imaging, microbiology, ancillary and laboratory) are listed below for reference.     Diagnostic Studies:   CT Head Wo Contrast  Result Date: 08/31/2021 CLINICAL DATA:  Seizure-like activity. EXAM: CT HEAD WITHOUT CONTRAST TECHNIQUE: Contiguous axial images were obtained from the base of the skull through the vertex without intravenous contrast. COMPARISON:  June 10, 2021 FINDINGS: Brain: No evidence of acute infarction, hemorrhage, hydrocephalus, extra-axial collection or mass lesion/mass  effect. Vascular: No hyperdense vessel or unexpected calcification. Skull: Normal. Negative for fracture or focal lesion. Sinuses/Orbits: There is a 2.4 cm x 1.5 cm left maxillary sinus polyp versus mucous retention cyst. Other: None. IMPRESSION: 1. No acute intracranial pathology. 2. Left maxillary sinus polyp versus mucous retention cyst. Electronically Signed   By: Virgina Norfolk M.D.   On: 08/31/2021 00:41   Portable Chest xray  Result Date: 09/01/2021 CLINICAL DATA:  Evaluate position of endotracheal tube EXAM: PORTABLE CHEST 1 VIEW COMPARISON:  08/31/2021 FINDINGS: Transverse diameter of heart is increased. There is new infiltrate in the right lower lung fields. New small linear densities seen in the medial left lower lung fields. There are no signs of alveolar pulmonary edema. There is no pneumothorax. Right lateral CP angle is indistinct. Tip endotracheal tube is 1.8 cm above the carina. Enteric tube is noted traversing the esophagus. Tip of right IJ chest port is seen at the junction of superior vena cava and right atrium. IMPRESSION: Cardiomegaly. There are no signs of pulmonary edema. New infiltrates are seen in both lower lung fields, more so on the right side suggesting atelectasis/pneumonia. Right lateral CP angle is indistinct, possibly suggesting small effusion.  Tip of endotracheal tube is close to the carina, 1.8 cm above the carina. Endotracheal tube could be withdrawn 2-3 cm. Electronically Signed   By: Elmer Picker M.D.   On: 09/01/2021 08:15   DG Chest Port 1 View  Result Date: 08/31/2021 CLINICAL DATA:  Check intubation and enteric tube positioning. EXAM: PORTABLE CHEST 1 VIEW COMPARISON:  Portable chest 08/01/2019. FINDINGS: Enteric tube is well inside the stomach but neither the proximal side-hole or tube terminus are included in the exam. ETT tip is 1.5 cm over the carina. Old right subclavian port catheter tip remains at the cavoatrial junction. There is moderate  cardiomegaly. Mild perihilar vascular congestion is noted without evidence of overt edema. There is a low inspiration with perihilar atelectatic bands but no convincing focal pneumonia, with limited view of the lower zones. No significant pleural effusion is seen. The aerated lungs are otherwise clear. There are left axillary surgical clips. IMPRESSION: 1. ETT tip is 1.5 cm from the carina. NGT is well inside the stomach but the proximal side hole and tip are not included. 2. Cardiomegaly with mild perihilar vascular congestion without overt edema. 3. Limited evaluation of the lungs due to low lung volumes. No convincing focal pneumonia. Follow-up study recommended in full inspiration. Electronically Signed   By: Telford Nab M.D.   On: 08/31/2021 05:12   EEG adult  Result Date: 08/31/2021 Lora Havens, MD     08/31/2021  2:44 PM Patient Name: Jailin Manocchio MRN: [id number] Epilepsy Attending: Lora Havens Referring Physician/Provider: Brand Males, MD Date: 08/31/2021 Duration: 23.18 mins Patient history: 66yo F presented to the emergency department with extreme agitation and delirium followed by 2 episodes so far of seizure-like activity as witnessed by nursing staff. EEG to evaluate for seizure Level of alertness:  comatose AEDs during EEG study: Propofol, ativan Technical aspects: This EEG study was done with scalp electrodes positioned according to the 10-20 International system of electrode placement. Electrical activity was acquired at a sampling rate of 500Hz  and reviewed with a high frequency filter of 70Hz  and a low frequency filter of 1Hz . EEG data were recorded continuously and digitally stored. Description: EEG showed continuous generalized polymorphic asynchronous 3 to 6 Hz theta-delta slowing admixed with 15-18hz  generalized beta activity. There was also 2-4 seconds of generalized eeg attenuation. Hyperventilation and photic stimulation were not performed.   ABNORMALITY - Continuous slow,  generalized IMPRESSION: This study is suggestive of severe diffuse encephalopathy, nonspecific etiology but likely related to sedation. No seizures or definite epileptiform discharges were seen throughout the recording. Priyanka O Yadav   [place] EKG SITE RITE  Result Date: 08/31/2021 If Site Rite image not attached, placement could not be confirmed due to current cardiac rhythm.  US Abdomen Limited RUQ (LIVER/GB)  Result Date: 08/31/2021 CLINICAL DATA:  Cirrhosis EXAM: ULTRASOUND ABDOMEN LIMITED RIGHT UPPER QUADRANT COMPARISON:  Previous studies including the CT done on 05-Apr-2020 FINDINGS: Gallbladder: No definite gallbladder stones are seen. There low-level echoes in the lumen of gallbladder. There is no significant wall thickening. Technologist did not observe any tenderness over the gallbladder. There is no fluid around the gallbladder. Common bile duct: Diameter: 2 mm Liver: There is minimal nodularity in the liver surface. Visualized portions of liver show no focal abnormalities. Portal vein is patent on color Doppler imaging with normal direction of blood flow towards the liver. Other: None. IMPRESSION: Possible sludge is seen in the lumen of gallbladder. There are no definite demonstrable gallbladder stones. There are no  signs of acute cholecystitis. Electronically Signed   By: Elmer Picker M.D.   On: 08/31/2021 12:43     Labs:   Basic Metabolic Panel: Recent Labs  Lab 09/07/21 0428 09/08/21 1207 09/10/21 0412  NA 143 139 138  K 3.5 3.4* 4.5  CL 109 105 103  CO2 25 25 23   GLUCOSE 128* 114* 100*  BUN 28* 24* 28*  CREATININE 1.53* 1.34* 1.17*  CALCIUM 9.0 8.5* 9.2  MG  --  1.5* 1.6*   GFR Estimated Creatinine Clearance: 51.3 mL/min (A) (by C-G formula based on SCr of 1.17 mg/dL (H)). Liver Function Tests: Recent Labs  Lab 09/08/21 1207  AST 35  ALT 24  ALKPHOS 59  BILITOT 0.7  PROT 6.9  ALBUMIN 2.8*   No results for input(s): LIPASE, AMYLASE in the last 168  hours. No results for input(s): AMMONIA in the last 168 hours. Coagulation profile No results for input(s): INR, PROTIME in the last 168 hours.  CBC: Recent Labs  Lab 09/07/21 0428 09/08/21 1207  WBC 10.1 6.8  HGB 9.1* 9.0*  HCT 28.3* 28.3*  MCV 99.3 100.0  PLT 287 305   Cardiac Enzymes: No results for input(s): CKTOTAL, CKMB, CKMBINDEX, TROPONINI in the last 168 hours. BNP: Invalid input(s): POCBNP CBG: Recent Labs  Lab 09/06/21 0739 09/06/21 1210 09/06/21 1514 09/06/21 1951 09/06/21 2316  GLUCAP 124* 111* 123* 124* 123*   D-Dimer No results for input(s): DDIMER in the last 72 hours. Hgb A1c No results for input(s): HGBA1C in the last 72 hours. Lipid Profile No results for input(s): CHOL, HDL, LDLCALC, TRIG, CHOLHDL, LDLDIRECT in the last 72 hours. Thyroid function studies No results for input(s): TSH, T4TOTAL, T3FREE, THYROIDAB in the last 72 hours.  Invalid input(s): FREET3 Anemia work up No results for input(s): VITAMINB12, FOLATE, FERRITIN, TIBC, IRON, RETICCTPCT in the last 72 hours. Microbiology No results found for this or any previous visit (from the past 240 hour(s)).   Discharge Instructions:   Discharge Instructions     Call MD for:  persistant nausea and vomiting   Complete by: As directed    Call MD for:  severe uncontrolled pain   Complete by: As directed    Diet - low sodium heart healthy   Complete by: As directed    Dysphagia 3 diet.   Discharge instructions   Complete by: As directed    Follow-up with your primary care provider at the skilled nursing facility in 3 to 5 days.  Continue to take medications as prescribed.  Check blood work in the next visit.  Seek medical attention for worsening symptoms.   Increase activity slowly   Complete by: As directed       Allergies as of 09/12/2021       Reactions   Amlodipine Swelling   BLE edema        Medication List     STOP taking these medications    APPLE CIDER VINEGAR PO    ascorbic acid 500 MG tablet Commonly known as: VITAMIN C   aspirin 81 MG EC tablet   traMADol 50 MG tablet Commonly known as: ULTRAM       TAKE these medications    albuterol 108 (90 Base) MCG/ACT inhaler Commonly known as: VENTOLIN HFA Inhale 2 puffs into the lungs every 6 (six) hours as needed for wheezing or shortness of breath.   amLODipine 10 MG tablet Commonly known as: NORVASC Take 1 tablet (10 mg total) by mouth daily.  anastrozole 1 MG tablet Commonly known as: ARIMIDEX Take 1 tablet (1 mg total) by mouth daily.   Biotin 10000 MCG Tabs Take 10,000 mcg by mouth daily.   carvedilol 12.5 MG tablet Commonly known as: COREG TAKE 1 TABLET BY MOUTH TWICE DAILY WITH MEALS   cloNIDine 0.2 mg/24hr patch Commonly known as: CATAPRES - Dosed in mg/24 hr Place 1 patch (0.2 mg total) onto the skin once a week.   Ensure Max Protein Liqd Take 330 mLs (11 oz total) by mouth 2 (two) times daily.   folic acid 1 MG tablet Commonly known as: FOLVITE Take 1 tablet (1 mg total) by mouth daily.   furosemide 20 MG tablet Commonly known as: LASIX Take 1 tablet (20 mg total) by mouth daily. Please make overdue appt with Dr. Gasper Sells before anymore refills. Thank you 1st attempt What changed: additional instructions   gabapentin 300 MG capsule Commonly known as: NEURONTIN Take 1 capsule (300 mg total) by mouth at bedtime.   lidocaine 5 % ointment Commonly known as: XYLOCAINE Apply 1 application topically as needed.   Magnesium Oxide 400 MG Caps Take 1 capsule (400 mg total) by mouth daily.   multivitamin with minerals Tabs tablet Take 1 tablet by mouth daily.   nystatin powder Commonly known as: MYCOSTATIN/NYSTOP Apply topically 3 (three) times daily. Apply to arms pits, fat folds, inter-trigone   thiamine 100 MG tablet Take 1 tablet (100 mg total) by mouth daily.        Time coordinating discharge: 39 minutes  Signed:  Alfredo Collymore  [facility] 09/12/2021, 12:43 PM

## 2021-09-12 NOTE — Progress Notes (Signed)
Patient discharged to [facility] via [place]. Attempted to call report at [phone], left message for a return call.

## 2021-09-12 NOTE — Plan of Care (Signed)
  Problem: Clinical Measurements: Goal: Ability to maintain clinical measurements within normal limits will improve Outcome: Progressing   Problem: Clinical Measurements: Goal: Will remain free from infection Outcome: Progressing   Problem: Clinical Measurements: Goal: Diagnostic test results will improve Outcome: Progressing   Problem: Clinical Measurements: Goal: Respiratory complications will improve Outcome: Progressing   

## 2021-10-12 ENCOUNTER — Encounter: Payer: Self-pay | Admitting: *Deleted

## 2021-10-12 ENCOUNTER — Other Ambulatory Visit: Payer: Self-pay | Admitting: *Deleted

## 2021-10-12 ENCOUNTER — Other Ambulatory Visit: Payer: Self-pay

## 2021-10-12 NOTE — Patient Outreach (Addendum)
[facility]) Care Management Telephonic RN Care Manager Note   10/12/2021 Name:  [name] MRN:  [id number] DOB:  [birth year]  Summary: Follow up outreach with patient  [name] up dates RN CM she has been home a week from  [place] facility after an admission (1/1-14/23) for acute respiratory failure with hypoxia, seizures with subsequent intubation and mechanical ventilation, chronic alcoholism She confirms residual visual difficulty Presently at her pcp office for follow up with her [name]. Pending assist per patient from pcp for home health or other services as she reports her family has been trying to help but have to start returning to work. She was noted to have to speak to [place] about her behavior "I need you to behave"    Recommendations/Changes made from today's visit: Follow up  Assess for update Discussed following up another day with her and she agreed   Subjective: [name] is an 66 y.o. year old female who is a primary patient of Paliwal, Himanshu, MD. The care management team was consulted for assistance with care management and/or care coordination needs.    Telephonic RN Care Manager completed Home Visit today.   Objective:  Medications Reviewed Today     Reviewed by Barbaraann Faster, RN (Registered Nurse) on 10/12/21 at 47  Med List Status: <None>   Medication Order Taking? Sig Documenting Provider Last Dose Status Informant  albuterol (VENTOLIN HFA) 108 (90 Base) MCG/ACT inhaler [id number]  Inhale 2 puffs into the lungs every 6 (six) hours as needed for wheezing or shortness of breath. [name], [name], MD  Active   amLODipine (NORVASC) 10 MG tablet [id number]  Take 1 tablet (10 mg total) by mouth daily. [name], [name], MD  Active   anastrozole (ARIMIDEX) 1 MG tablet [id number] No Take 1 tablet (1 mg total) by mouth daily. Magrinat, Virgie Dad, MD Past Week Active Self           Med Note Lodema Hong, [name] Aug 31, 2021  2:44 PM) 03/02/21 30DS  betamethasone acetate-betamethasone sodium phosphate (CELESTONE) injection 3 mg [id number]   Edrick Kins, DPM  Active   Biotin 10000 MCG TABS [id number] No Take 10,000 mcg by mouth daily. [provider] Past Month Active Self  carvedilol (COREG) 12.5 MG tablet [id number]  TAKE 1 TABLET BY MOUTH TWICE DAILY WITH MEALS Ladell Pier, MD  Active            Med Note Lodema Hong, COURTNEY A   Mon Aug 31, 2021  2:45 PM) 05/28/21 30DS  cloNIDine (CATAPRES - DOSED IN MG/24 HR) 0.2 mg/24hr patch [id number]  Place 1 patch (0.2 mg total) onto the skin once a week. [name], [name], MD  Active   Ensure Max Protein (ENSURE MAX PROTEIN) LIQD [id number]  Take 330 mLs (11 oz total) by mouth 2 (two) times daily. [name], [name], MD  Active   folic acid (FOLVITE) 1 MG tablet [id number] No Take 1 tablet (1 mg total) by mouth daily.  Patient not taking: Reported on 09/11/2021   [name], MD Not Taking Active Self  furosemide (LASIX) 20 MG tablet [id number]  Take 1 tablet (20 mg total) by mouth daily. Please make overdue appt with Dr. Gasper Sells before anymore refills. Thank you 1st attempt  Patient taking differently: Take 20 mg by mouth daily.   [name], MD  Active   gabapentin (NEURONTIN) 300 MG capsule [id number] No Take 1 capsule (300 mg total) by  mouth at bedtime. Magrinat, Virgie Dad, MD 02/23/2021 Active Self           Med Note Lodema Hong, COURTNEY A   Mon Aug 31, 2021  2:46 PM) 05/27/21 30DS  lidocaine (XYLOCAINE) 5 % ointment [id number]  Apply 1 application topically as needed. Sherrell Puller, PA-C  Active            Med Note Lodema Hong, [name] Aug 31, 2021  2:47 PM) 06/11/21 15DS  Magnesium Oxide 400 MG CAPS [id number]  Take 1 capsule (400 mg total) by mouth daily. [name], MD  Active Self  Multiple Vitamin (MULTIVITAMIN WITH MINERALS) TABS tablet [id number]  Take 1 tablet by mouth daily. [name], Corrie Mckusick, MD  Active    nystatin (MYCOSTATIN/NYSTOP) powder [id number]  Apply topically 3 (three) times daily. Apply to arms pits, fat folds, inter-trigone [name], [name], MD  Active   thiamine 100 MG tablet [id number]  Take 1 tablet (100 mg total) by mouth daily. Flora Lipps, MD  Active   Med List Note Thayer Ohm Gillis Ends 09/01/21 3546):               SDOH:  (Social Determinants of Health) assessments and interventions performed:    Care Plan  Review of patient past medical history, allergies, medications, health status, including review of consultants reports, laboratory and other test data, was performed as part of comprehensive evaluation for care management services.   There are no care plans that you recently modified to display for this patient.    Plan: The patient has been provided with contact information for the care management team and has been advised to call with any health related questions or concerns.  The care management team will reach out to the patient again over the next 30+ business days.  Dezhane Staten L. [name], RN, BSN, [place] Coordinator Office number [phone] Main [facility] number [phone] Fax number [phone]

## 2021-10-15 ENCOUNTER — Other Ambulatory Visit: Payer: Self-pay | Admitting: *Deleted

## 2021-10-15 NOTE — Patient Outreach (Signed)
[facility]) Care Management  10/15/2021  [name] April 10, 1956 [id number]   [facility] Unsuccessful outreach   Outreach attempt to the listed at the preferred outreach number in [place]  No answer. [facility] RN CM left HIPAA [facility] Portability and Accountability Act) compliant voicemail message along with CMs contact info.   Plan: [facility] RN CM scheduled this patient for another call attempt within 30+ business days Unsuccessful outreach on 10/15/21   Joelene Millin L. [name], RN, BSN, [place] Coordinator Office number [phone]

## 2021-10-22 ENCOUNTER — Other Ambulatory Visit: Payer: Self-pay | Admitting: *Deleted

## 2021-10-22 ENCOUNTER — Other Ambulatory Visit: Payer: Self-pay

## 2021-10-22 NOTE — Patient Outreach (Addendum)
[facility]) Care Management Telephonic RN Care Manager Note   10/22/2021 Name:  [name] MRN:  [id number] DOB:  May 16, 1956  Summary: Outreach follow up  [name] reports she is doing fair still not "up to par" physically nor socially  She is socially attempting to cope with the care of her daughter She reports trying to find a group home for daughter, [name] who has been combative, cooking unsupervised, screaming frequently, and having a needless intake of laxatives  Daughter's SW, [name] [phone] office-- [phone] cell) visited today per pt  [name] to assist with [facility] collaboration   Pt is not being able to sleep as daughter need 24 hour supervision and does not sleep This concerns pt related to her history of seizures Pt is having memory and visual symptoms She describes double and peripheral vision issues (blurred, seeing more than one thing, not being able to see to the side) She reports she is able to see shapes Throughout the outreach she is heard pleading with her daughter to settle down, remain still or come within her vision She reports she did have the support of her son but he recently obtained a job he really enjoys with hours from 5 am to 5 pm This is helping financially as she reports he had not been working steadily in "twenty years" [name] (?), her grand daughter is present in the home today also and is partially helpful to write or read items for patient with some reluctant. During the outreach someone name [name] visited   RN CM inquired if [name] had outreached to her home health staff or [place] worker. She reports she did not know if [name] was working with her too. [name] reports she is to be seen by her pcp on 10/23/21 and ask about a Education officer, museum for herself. Pt confirms she was discharged home from [place] with home health. She can not recall the name of the home health agency but received a call from the  agency reporting they are awaiting on orders from [facility] street health to start visits. She went to [facility] street for post snf visit 10/12/21, therefore has been home at least 10 + days without home health  [name] returned a call to RN CM she clarified she is pt's daughter's Adult protective services (APS) case worker only but will assist to connect pt with her possible DSS staff    Recommendations/Changes made from today's visit: Assessed for worsening symptoms Discussed with patient that she possibly has a DSS case worker assigned to her she is unaware of and also needs to check on the status of her home health services as it is a possibility a SW may also be ordered Assisted patient with leaving a message for [name] staff Assisted patient with calling [name] office to schedule a visit for Tuesday 2/28/2 0930 Confirmed she was able to schedule transportation Update her on the return call from Chauncy Lean Updated her that Sue Dolly is only providing services to [place] but will attempt to help locate her DSS case worker. Attempt to find possible skilled nursing discharge information via bamboo/PING unsuccessful Outreach to [facility] street health spoke with [facility]ja [employer] Ronny Bacon ([phone] to inquire of the name of the home health agency, discuss orders for RN PT SW  RN Cm was offered an updated office Fax #[phone]  Subjective: [name] is an 66 y.o. year old  female who is a primary patient of Paliwal, Himanshu, MD. The care management team was consulted for assistance with care management and/or care coordination needs.    Telephonic RN Care Manager completed Telephone Visit today.   Objective:  Medications Reviewed Today     Reviewed by Sue Faster, RN (Registered Nurse) on 10/22/21 at 12  Med List Status: <None>   Medication Order Taking? Sig Documenting Provider Last Dose Status Informant  albuterol (VENTOLIN HFA) 108 (90 Base) MCG/ACT inhaler [id number]  Inhale 2 puffs  into the lungs every 6 (six) hours as needed for wheezing or shortness of breath. [name], [name], MD  Active   amLODipine (NORVASC) 10 MG tablet [id number]  Take 1 tablet (10 mg total) by mouth daily. [name], [name], MD  Active   anastrozole (ARIMIDEX) 1 MG tablet [id number] No Take 1 tablet (1 mg total) by mouth daily. Magrinat, Virgie Dad, MD Past Week Active Self           Med Note Lodema Hong, [name] Aug 31, 2021  2:44 PM) 03/02/21 30DS  betamethasone acetate-betamethasone sodium phosphate (CELESTONE) injection 3 mg [id number]   Edrick Kins, DPM  Active   Biotin 10000 MCG TABS [id number] No Take 10,000 mcg by mouth daily. [provider] Past Month Active Self  carvedilol (COREG) 12.5 MG tablet [id number]  TAKE 1 TABLET BY MOUTH TWICE DAILY WITH MEALS Ladell Pier, MD  Active            Med Note Lodema Hong, COURTNEY A   Mon Aug 31, 2021  2:45 PM) 05/28/21 30DS  cloNIDine (CATAPRES - DOSED IN MG/24 HR) 0.2 mg/24hr patch [id number]  Place 1 patch (0.2 mg total) onto the skin once a week. [name], [name], MD  Active   Ensure Max Protein (ENSURE MAX PROTEIN) LIQD [id number]  Take 330 mLs (11 oz total) by mouth 2 (two) times daily. [name], [name], MD  Active   folic acid (FOLVITE) 1 MG tablet [id number] No Take 1 tablet (1 mg total) by mouth daily.  Patient not taking: Reported on 09/11/2021   [name], MD Not Taking Active Self  furosemide (LASIX) 20 MG tablet [id number]  Take 1 tablet (20 mg total) by mouth daily. Please make overdue appt with Sue. Gasper Sells before anymore refills. Thank you 1st attempt  Patient taking differently: Take 20 mg by mouth daily.   [name], MD  Active   gabapentin (NEURONTIN) 300 MG capsule [id number] No Take 1 capsule (300 mg total) by mouth at bedtime. Magrinat, Virgie Dad, MD 02/23/2021 Active Self           Med Note Lodema Hong, COURTNEY A   Mon Aug 31, 2021  2:46 PM) 05/27/21 30DS  lidocaine (XYLOCAINE) 5 % ointment [id number]   Apply 1 application topically as needed. Sherrell Puller, PA-C  Active            Med Note Lodema Hong, [name] Aug 31, 2021  2:47 PM) 06/11/21 15DS  Magnesium Oxide 400 MG CAPS [id number]  Take 1 capsule (400 mg total) by mouth daily. [name], MD  Active Self  Multiple Vitamin (MULTIVITAMIN WITH MINERALS) TABS tablet [id number]  Take 1 tablet by mouth daily. [name], Corrie Mckusick, MD  Active   nystatin (MYCOSTATIN/NYSTOP) powder [id number]  Apply topically 3 (three) times daily. Apply to arms pits, fat folds, inter-trigone [name], [name], MD  Active   thiamine 100 MG tablet [id number]  Take 1 tablet (100 mg  total) by mouth daily. Flora Lipps, MD  Active   Med List Note Thayer Ohm Gillis Ends 09/01/21 4128):               SDOH:  (Social Determinants of Health) assessments and interventions performed:  SDOH Interventions    Flowsheet Row Most Recent Value  SDOH Interventions   Stress Interventions Other (Comment)  [APS SW working to place daughter in group home, to assist in getting pt a sw for services]  Social Connections Interventions Intervention Not Indicated  Transportation Interventions Intervention Not Indicated       Care Plan  Review of patient past medical history, allergies, medications, health status, including review of consultants reports, laboratory and other test data, was performed as part of comprehensive evaluation for care management services.   Care Plan : RN Care Manager Plan of Care  Updates made by Sue Faster, RN since 10/22/2021 12:00 AM     Problem: Complex Care Coordination Needs and disease management in patient with CHF   Priority: High  Onset Date: 10/12/2021     Long-Range Goal: Establish Plan of Care for Management Complex SDOH Barriers, disease management and Care Coordination Needs in patient with CHF   Start Date: 10/12/2021  This Visit's Progress: On track  Recent Progress: On track  Priority: High  Note:    Current Barriers:  Knowledge Deficits related to plan of care for management of CHF and seizures  Care Coordination needs related to Limited social support, Substance abuse issues -  alcohol, Limited education about CHF, seizures*, and Lacks knowledge of community resource: ophthalmology Barriers: cares for developmental delayed daughter, [name] in the home, decreases support with care of [name], visual deficits, poor sleep  Admissions 1/1-14/23 acute respiratory, seizures, alcoholism  RN CM Clinical Goal(s):  Patient will demonstrate Improved adherence to prescribed treatment plan for CHF and seizures as evidenced by reduced risk of re admission, CHF & seizures s/s  through collaboration with RN Care manager, provider, and care team.   Interventions: Outreaches for care coordination, disease management, resources, home care/education needs Inter-disciplinary care team collaboration (see longitudinal plan of care) Evaluation of current treatment plan related to  self management and patient's adherence to plan as established by provider   Heart Failure Interventions:  (Status:  Goal on track:  Yes.) Long Term Goal Discussed the importance of keeping all appointments with provider Advised patient to discuss home health services with provider Assessed social determinant of health barriers    Health Maintenance Interventions:  (Status:  New goal.) Short Term Goal Patient interviewed about adult health maintenance status including  Regular eye checkups 10/22/21 assisted with conference call to [name] to schedule appointment for 10/27/21 0930    Interdisciplinary Collaboration Interventions:  (Status: New goal.) Short Term Goal   Collaborated with DSS SW/APS, [name] to initiate plan of care to address needs related to Limited social support in patient with CHF and seizures Collaboration with [name], MD office staff regarding pending status of home health services patient was  informed initiated by [place] at snf discharge Attempt to find possible skilled nursing discharge information via bamboo/PING unsuccessful   Seizures  (Status:  New goal.)  Short Term Goal Evaluation of current treatment plan related to  seizures , Limited education about seizures* self-management and patient's adherence to plan as established by provider. Discussed plans with patient for ongoing care management follow up and provided patient with direct contact information for care management team Evaluation of  current treatment plan related to seizures and patient's adherence to plan as established by provider Reviewed medications with patient and discussed seizures 10/22/21 assessed for home health start date, possible community SW services. Assisted to leave a message for Chip Boer DSS SW  Patient Goals/Self-Care Activities: Take all medications as prescribed Attend all scheduled provider appointments Perform all self care activities independently  Call provider office for new concerns or questions  Work with the social worker to address care coordination needs and will continue to work with the clinical team to address health care and disease management related needs  Follow Up Plan:  The patient has been provided with contact information for the care management team and has been advised to call with any health related questions or concerns.  The care management team will reach out to the patient again over the next 30+ business days.       Plan: The patient has been provided with contact information for the care management team and has been advised to call with any health related questions or concerns.  The care management team will reach out to the patient again over the next 30+ business days. Faxed this note and other pertinent RN CM notes to [employer] Sue. [name], RN, BSN, [place] Coordinator Office number [phone] Main [facility] number  [phone] Fax number [phone]

## 2021-10-29 ENCOUNTER — Other Ambulatory Visit: Payer: Self-pay | Admitting: *Deleted

## 2021-10-29 NOTE — Patient Outreach (Signed)
[facility]) Care Management ? ?10/29/2021 ? ?[name] ?24-Sep-1955 ?[id number] ? ? ?[facility] Unsuccessful outreach ? ? ?Outreach attempt to the listed at the preferred outreach number in [place] ?No answer. [facility] RN CM left HIPAA [facility] Portability and Accountability Act) compliant voicemail message along with CM?s contact info.  ? ?Plan: ?[facility] RN CM scheduled this patient for another call attempt within 30+ business days ?Unsuccessful outreach on 10/29/21 ? ? ?Carine Nordgren L. [name], RN, BSN, CCM ?[place] Management Care Coordinator ?Office number [phone] ? ?

## 2021-11-05 ENCOUNTER — Other Ambulatory Visit: Payer: Self-pay | Admitting: *Deleted

## 2021-11-05 NOTE — Patient Outreach (Addendum)
[facility]) Care Management ? ?11/05/2021 ? ?[name] ?1955-09-16 ?[id number] ? ? ?[facility] Unsuccessful outreach ?  ?  ?Outreach attempt to the listed at the preferred outreach number in [place] ?No answer. Mail box full [facility] RN CM left a SMS message with CM?s contact info.  ?  ?Plan: ?[facility] RN CM scheduled this patient for another call attempt within 30+ business days ?Unsuccessful outreach on 10/29/21, 11/05/21 ?Unsuccessful letter mailed on 11/05/21 ?  ?  ?[name] L. [name], RN, BSN, CCM ?[place] Management Care Coordinator ?Office number [phone] ?

## 2021-11-06 ENCOUNTER — Inpatient Hospital Stay: Payer: Medicaid Other | Admitting: Internal Medicine

## 2021-11-12 ENCOUNTER — Other Ambulatory Visit: Payer: Self-pay | Admitting: *Deleted

## 2021-11-12 ENCOUNTER — Other Ambulatory Visit: Payer: Self-pay

## 2021-11-12 NOTE — Patient Outreach (Signed)
[facility]) Care Management ?Telephonic RN Care Manager Note ? ? ?11/13/2021 ?Name:  [name] MRN:  [id number] DOB:  [birth year] ? ?Summary: ?Successful follow up outreach ?[name] reports improvements today in her sleep, home services and eye appointment ?She is now being seen by home health RN, PT, OT and " a doctor" but can not recall the name of the agency nor is able to visibly see the name of the agency on forms in the home ? ?She reports she is getting more sleep after her daughter's MD provided her [name], with sleeping medicine ? ?[name] did attend her ophthalmology appointment with Dr Katy Fitch. She reports an un determined diagnosis and pending cataract procedure after further "research" She is scheduled follow up in 3 months She states her [facility] RN examined her and concluded she has some visual issues with "my eyes not tracking" ? ? ?Recommendations/Changes made from today's visit: ?Assessed for updates/improvements/ worsening symptoms ?Encouraged patient related to her participation with therapies, nutrition and attendance of appointments ?Spoke with [name] at her primary care provider office to leave a message for pcp staff about her home health agency   ?Spoke with Sue [facility] SW Mr Lanelle Bal about her home health services (? [facility]), [facility] goals (home management of medical diagnoses, vision, DSS staff) Mr Lanelle Bal attempted to assist pt with Personal care services [facility]) but pt is not eligible ? ? ?Subjective: ?[name] is an 66 y.o. year old female who is a primary patient of Paliwal, Himanshu, MD. The care management team was consulted for assistance with care management and/or care coordination needs.   ? ?Telephonic RN Care Manager completed Telephone Visit today.  ? ?Objective: ? ?Medications Reviewed Today   ? ? Reviewed by Barbaraann Faster, RN (Registered Nurse) on 10/22/21 at 67  Med List Status: <None>  ? ?Medication Order Taking? Sig Documenting  Provider Last Dose Status Informant  ?albuterol (VENTOLIN HFA) 108 (90 Base) MCG/ACT inhaler [id number]  Inhale 2 puffs into the lungs every 6 (six) hours as needed for wheezing or shortness of breath. [name], [name], MD  Active   ?amLODipine (NORVASC) 10 MG tablet [id number]  Take 1 tablet (10 mg total) by mouth daily. [name], [name], MD  Active   ?anastrozole (ARIMIDEX) 1 MG tablet [id number] No Take 1 tablet (1 mg total) by mouth daily. Magrinat, Virgie Dad, MD Past Week Active Self  ?         ?Med Note Lodema Hong, [name] Aug 31, 2021  2:44 PM) 03/02/21 30DS  ?betamethasone acetate-betamethasone sodium phosphate (CELESTONE) injection 3 mg [id number]   Edrick Kins, DPM  Active   ?Biotin 10000 MCG TABS [id number] No Take 10,000 mcg by mouth daily. [provider] Past Month Active Self  ?carvedilol (COREG) 12.5 MG tablet [id number]  TAKE 1 TABLET BY MOUTH TWICE DAILY WITH MEALS Ladell Pier, MD  Active   ?         ?Med Note Lodema Hong, [name] Aug 31, 2021  2:45 PM) 05/28/21 30DS  ?cloNIDine (CATAPRES - DOSED IN MG/24 HR) 0.2 mg/24hr patch [id number]  Place 1 patch (0.2 mg total) onto the skin once a week. [name], Corrie Mckusick, MD  Active   ?Ensure Max Protein (ENSURE MAX PROTEIN) LIQD [id number]  Take 330 mLs (11 oz total) by mouth 2 (two) times daily. [name], [name], MD  Active   ?folic acid (FOLVITE) 1 MG tablet [id number] No Take 1  tablet (1 mg total) by mouth daily.  ?Patient not taking: Reported on 09/11/2021  ? [name], MD Not Taking Active Self  ?furosemide (LASIX) 20 MG tablet [id number]  Take 1 tablet (20 mg total) by mouth daily. Please make overdue appt with Dr. Gasper Sells before anymore refills. Thank you 1st attempt  ?Patient taking differently: Take 20 mg by mouth daily.  ? [name], MD  Active   ?gabapentin (NEURONTIN) 300 MG capsule [id number] No Take 1 capsule (300 mg total) by mouth at bedtime. Magrinat, Virgie Dad, MD 02/23/2021 Active Self  ?          ?Med Note Lodema Hong, [name] Aug 31, 2021  2:46 PM) 05/27/21 30DS  ?lidocaine (XYLOCAINE) 5 % ointment [id number]  Apply 1 application topically as needed. Sherrell Puller, PA-C  Active   ?         ?Med Note Lodema Hong, [name] Aug 31, 2021  2:47 PM) 06/11/21 15DS  ?Magnesium Oxide 400 MG CAPS [id number]  Take 1 capsule (400 mg total) by mouth daily. [name], MD  Active Self  ?Multiple Vitamin (MULTIVITAMIN WITH MINERALS) TABS tablet [id number]  Take 1 tablet by mouth daily. [name], Corrie Mckusick, MD  Active   ?nystatin (MYCOSTATIN/NYSTOP) powder [id number]  Apply topically 3 (three) times daily. Apply to arms pits, fat folds, inter-trigone [name], [name], MD  Active   ?thiamine 100 MG tablet [id number]  Take 1 tablet (100 mg total) by mouth daily. [name], Corrie Mckusick, MD  Active   ?Med List Note Inez Catalina 09/01/21 3335):    ? ?  ?  ? ?  ? ? ? ?SDOH:  (Social Determinants of Health) assessments and interventions performed:  ? ? ?Care Plan ? ?Review of patient past medical history, allergies, medications, health status, including review of consultants reports, laboratory and other test data, was performed as part of comprehensive evaluation for care management services.  ? ?Care Plan : RN Care Manager Plan of Care  ?Updates made by Barbaraann Faster, RN since 11/13/2021 12:00 AM  ?  ? ?Problem: Complex Care Coordination Needs and disease management in patient with CHF   ?Priority: High  ?Onset Date: 10/12/2021  ?  ? ?Long-Range Goal: Establish Plan of Care for Management Complex SDOH Barriers, disease management and Care Coordination Needs in patient with CHF   ?Start Date: 10/12/2021  ?This Visit's Progress: On track  ?Recent Progress: On track  ?Priority: High  ?Note:   ?Current Barriers:  ?Knowledge Deficits related to plan of care for management of CHF and seizures  ?Care Coordination needs related to Limited social support, Substance abuse issues -  alcohol, Limited education about  CHF, seizures*, and Lacks knowledge of community resource: ophthalmology ?Barriers: cares for developmental delayed daughter, [name] in the home, decreases support with care of [name], visual deficits, poor sleep  ?Admissions 1/1-14/23 acute respiratory, seizures, alcoholism ?Unsuccessful outreaches 07/20/21, 07/29/21, 10/15/21, 10/29/21, 11/05/21  ? ?RN CM Clinical Goal(s):  ?Patient will demonstrate Improved adherence to prescribed treatment plan for CHF and seizures as evidenced by reduced risk of re admission, CHF & seizures s/s  through collaboration with RN Care manager, provider, and care team.  ? ?Interventions: ?Outreaches for care coordination, disease management, resources, home care/education needs ?Inter-disciplinary care team collaboration (see longitudinal plan of care) ?Evaluation of current treatment plan related to  self management and patient's adherence to plan as established by provider ? ? ?Heart Failure Interventions:  (Status:  Goal on track:  Yes.) Long Term Goal ?Discussed the importance of keeping all appointments with provider ?Advised patient to discuss home health services with provider ?Assessed social determinant of health barriers  ? ? ?Health Maintenance Interventions: ? (Status:  Goal on track:  Yes.) Short Term Goal ?Patient interviewed about adult health maintenance status including  Regular eye checkups ?10/22/21 assisted with conference call to Dr Katy Fitch to schedule appointment for 10/27/21 0930 - Goal met Pt attend eye MD ([place]) visit, follow up in 3 months . She reports an un determined diagnosis and pending cataract procedure after further "research". her [facility] RN examined her and concluded she has some visual issues with "my eyes not tracking" ? ? ?Interdisciplinary Collaboration Interventions:  (Status: Goal on track:  Yes.) Short Term Goal   ?Collaborated with DSS SW/APS, L Moore to initiate plan of care to address needs related to Limited social support in patient with CHF and  seizures ?Collaboration with [name], MD office staff regarding pending status of home health services patient was informed initiated by [place] at snf discharge ?Attempt to find possible ski

## 2021-11-17 ENCOUNTER — Telehealth: Payer: Self-pay | Admitting: Hematology and Oncology

## 2021-11-17 NOTE — Telephone Encounter (Signed)
.  Called patient to schedule appointment per 3/21 inbasket, patient is aware of date and time.   ?

## 2021-11-25 ENCOUNTER — Telehealth: Payer: Self-pay

## 2021-11-25 ENCOUNTER — Inpatient Hospital Stay: Payer: Medicare Other | Attending: Adult Health | Admitting: Adult Health
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
## 2021-11-30 ENCOUNTER — Telehealth: Payer: Self-pay | Admitting: Adult Health

## 2021-11-30 NOTE — Telephone Encounter (Signed)
Rescheduled appointment per 3/29 scheduling message. Patient is aware. ?

## 2021-12-01 ENCOUNTER — Emergency Department (HOSPITAL_COMMUNITY)
Admission: EM | Admit: 2021-12-01 | Discharge: 2021-12-02 | Disposition: A | Discharge status: Home / Self Care | Payer: Medicare Other | Attending: Emergency Medicine | Admitting: Emergency Medicine

## 2021-12-01 ENCOUNTER — Encounter (HOSPITAL_COMMUNITY): Payer: Self-pay | Admitting: Emergency Medicine

## 2021-12-01 ENCOUNTER — Other Ambulatory Visit: Payer: Self-pay

## 2021-12-01 DIAGNOSIS — R202 Paresthesia of skin: Secondary | ICD-10-CM | POA: Diagnosis not present

## 2021-12-01 DIAGNOSIS — Z79899 Other long term (current) drug therapy: Secondary | ICD-10-CM | POA: Insufficient documentation

## 2021-12-01 DIAGNOSIS — R600 Localized edema: Secondary | ICD-10-CM | POA: Diagnosis present

## 2021-12-01 DIAGNOSIS — D649 Anemia, unspecified: Secondary | ICD-10-CM | POA: Diagnosis not present

## 2021-12-01 LAB — CBC WITH DIFFERENTIAL/PLATELET
Abs Immature Granulocytes: 0.04 10*3/uL (ref 0.00–0.07)
Basophils Absolute: 0 10*3/uL (ref 0.0–0.1)
Basophils Relative: 0 %
Eosinophils Absolute: 0.2 10*3/uL (ref 0.0–0.5)
Eosinophils Relative: 4 %
HCT: 33.2 % — ABNORMAL LOW (ref 36.0–46.0)
Hemoglobin: 10.8 g/dL — ABNORMAL LOW (ref 12.0–15.0)
Immature Granulocytes: 1 %
Lymphocytes Relative: 23 %
Lymphs Abs: 1.2 10*3/uL (ref 0.7–4.0)
MCH: 30.3 pg (ref 26.0–34.0)
MCHC: 32.5 g/dL (ref 30.0–36.0)
MCV: 93.3 fL (ref 80.0–100.0)
Monocytes Absolute: 0.4 10*3/uL (ref 0.1–1.0)
Monocytes Relative: 8 %
Neutro Abs: 3.3 10*3/uL (ref 1.7–7.7)
Neutrophils Relative %: 64 %
Platelets: 170 10*3/uL (ref 150–400)
RBC: 3.56 MIL/uL — ABNORMAL LOW (ref 3.87–5.11)
RDW: 13.5 % (ref 11.5–15.5)
WBC: 5 10*3/uL (ref 4.0–10.5)
nRBC: 0 % (ref 0.0–0.2)

## 2021-12-01 LAB — BASIC METABOLIC PANEL
Anion gap: 5 (ref 5–15)
BUN: 25 mg/dL — ABNORMAL HIGH (ref 8–23)
CO2: 23 mmol/L (ref 22–32)
Calcium: 8.2 mg/dL — ABNORMAL LOW (ref 8.9–10.3)
Chloride: 110 mmol/L (ref 98–111)
Creatinine, Ser: 1.79 mg/dL — ABNORMAL HIGH (ref 0.44–1.00)
GFR, Estimated: 31 mL/min — ABNORMAL LOW (ref >60)
Glucose, Bld: 94 mg/dL (ref 70–99)
Potassium: 3.2 mmol/L — ABNORMAL LOW (ref 3.5–5.1)
Sodium: 138 mmol/L (ref 135–145)

## 2021-12-01 MED ORDER — HEPARIN SOD (PORK) LOCK FLUSH 100 UNIT/ML IV SOLN
500.0000 [IU] | Freq: Once | INTRAVENOUS | Status: AC
  Administered 2021-12-01: 500 [IU]
  Filled 2021-12-01: qty 5

## 2021-12-01 MED ORDER — OXYCODONE-ACETAMINOPHEN 5-325 MG PO TABS
1.0000 | ORAL_TABLET | Freq: Four times a day (QID) | ORAL | 0 refills | Status: DC | PRN
Start: 2021-12-01 — End: 2022-02-07

## 2021-12-01 NOTE — ED Provider Notes (Signed)
?[place] DEPT ?Provider Note ? ? ?CSN: [id number] ?Arrival date & time: 12/01/21  1120 ? ?  ? ?History ? ?Chief Complaint  ?Patient presents with  ? Leg Pain  ? ? ?[name] is a 66 y.o. female. ? ?Six 71-year-old female presents with worsening bilateral lower extremity edema.  Patient denies any chest pain or shortness of breath.  Notes that she has had some paresthesias to both feet as well.  Patient is in a wheelchair and does use a walker occasionally.  Denies any history of diabetes.  Called EMS and was transported here ? ? ?  ? ?Home Medications ?Prior to Admission medications   ?Medication Sig Start Date End Date Taking? Authorizing Provider  ?albuterol (VENTOLIN HFA) 108 (90 Base) MCG/ACT inhaler Inhale 2 puffs into the lungs every 6 (six) hours as needed for wheezing or shortness of breath. 09/12/21 09/12/22  [name], Corrie Mckusick, MD  ?amLODipine (NORVASC) 10 MG tablet Take 1 tablet (10 mg total) by mouth daily. 09/12/21   [name], Corrie Mckusick, MD  ?anastrozole (ARIMIDEX) 1 MG tablet Take 1 tablet (1 mg total) by mouth daily. 12/18/20   Magrinat, Virgie Dad, MD  ?Biotin 10000 MCG TABS Take 10,000 mcg by mouth daily.    [provider]  ?carvedilol (COREG) 12.5 MG tablet TAKE 1 TABLET BY MOUTH TWICE DAILY WITH MEALS 04/08/21   Ladell Pier, MD  ?cloNIDine (CATAPRES - DOSED IN MG/24 HR) 0.2 mg/24hr patch Place 1 patch (0.2 mg total) onto the skin once a week. 09/12/21   [name], Corrie Mckusick, MD  ?Ensure Max Protein (ENSURE MAX PROTEIN) LIQD Take 330 mLs (11 oz total) by mouth 2 (two) times daily. 09/12/21   [name], Corrie Mckusick, MD  ?folic acid (FOLVITE) 1 MG tablet Take 1 tablet (1 mg total) by mouth daily. ?Patient not taking: Reported on 09/11/2021 08/03/20   [name], MD  ?furosemide (LASIX) 20 MG tablet Take 1 tablet (20 mg total) by mouth daily. Please make overdue appt with Dr. Gasper Sells before anymore refills. Thank you 1st attempt ?Patient taking differently: Take 20 mg  by mouth daily. 06/03/21   [name], MD  ?gabapentin (NEURONTIN) 300 MG capsule Take 1 capsule (300 mg total) by mouth at bedtime. 07/14/20   Magrinat, Virgie Dad, MD  ?lidocaine (XYLOCAINE) 5 % ointment Apply 1 application topically as needed. 06/10/21   Sherrell Puller, PA-C  ?Magnesium Oxide 400 MG CAPS Take 1 capsule (400 mg total) by mouth daily. 06/30/20   [name], MD  ?Multiple Vitamin (MULTIVITAMIN WITH MINERALS) TABS tablet Take 1 tablet by mouth daily. 09/12/21   [name], Corrie Mckusick, MD  ?nystatin (MYCOSTATIN/NYSTOP) powder Apply topically 3 (three) times daily. Apply to arms pits, fat folds, inter-trigone 09/12/21   [name], [name], MD  ?thiamine 100 MG tablet Take 1 tablet (100 mg total) by mouth daily. 09/12/21   [name], Corrie Mckusick, MD  ?   ? ?Allergies    ?Amlodipine   ? ?Review of Systems   ?Review of Systems  ?All other systems reviewed and are negative. ? ?Physical Exam ?Updated Vital Signs ?BP (!) 149/66 (BP Location: Left Arm)   Pulse 85   Temp 98.1 ?F (36.7 ?C) (Oral)   Resp (!) 21   SpO2 98%  ?Physical Exam ?Vitals and nursing note reviewed.  ?Constitutional:   ?   General: She is not in acute distress. ?   Appearance: Normal appearance. She is well-developed. She is not toxic-appearing.  ?HENT:  ?  Head: Normocephalic and atraumatic.  ?Eyes:  ?   General: Lids are normal.  ?   Conjunctiva/sclera: Conjunctivae normal.  ?   Pupils: Pupils are equal, round, and reactive to light.  ?Neck:  ?   Thyroid: No thyroid mass.  ?   Trachea: No tracheal deviation.  ?Cardiovascular:  ?   Rate and Rhythm: Normal rate and regular rhythm.  ?   Heart sounds: Normal heart sounds. No murmur heard. ?  No gallop.  ?Pulmonary:  ?   Effort: Pulmonary effort is normal. No respiratory distress.  ?   Breath sounds: Normal breath sounds. No stridor. No decreased breath sounds, wheezing, rhonchi or rales.  ?Abdominal:  ?   General: There is no distension.  ?   Palpations: Abdomen is soft.  ?    Tenderness: There is no abdominal tenderness. There is no rebound.  ?Musculoskeletal:     ?   General: No tenderness. Normal range of motion.  ?   Cervical back: Normal range of motion and neck supple.  ?Lymphadenopathy:  ?   Comments: 3+ bilateral lower extremity pitting edema.  Neurovascular intact at bilateral feet  ?Skin: ?   General: Skin is warm and dry.  ?   Findings: No abrasion or rash.  ?Neurological:  ?   Mental Status: She is alert and oriented to person, place, and time. Mental status is at baseline.  ?   GCS: GCS eye subscore is 4. GCS verbal subscore is 5. GCS motor subscore is 6.  ?   Cranial Nerves: No cranial nerve deficit.  ?   Sensory: No sensory deficit.  ?   Motor: Motor function is intact.  ?Psychiatric:     ?   Attention and Perception: Attention normal.     ?   Speech: Speech normal.     ?   Behavior: Behavior normal.  ? ? ?ED Results / Procedures / Treatments   ?Labs ?(all labs ordered are listed, but only abnormal results are displayed) ?Labs Reviewed  ?CBC WITH DIFFERENTIAL/PLATELET  ?BASIC METABOLIC PANEL  ? ? ?EKG ?None ? ?Radiology ?No results found. ? ?Procedures ?Procedures  ? ? ?Medications Ordered in ED ?Medications - No data to display ? ?ED Course/ Medical Decision Making/ A&P ?  ?                        ?Medical Decision Making ?Amount and/or Complexity of Data Reviewed ?Labs: ordered. ? ? ?Patient presented with bilateral lower extremity pitting edema.  She has known history of peripheral edema.  CBC within normal limits with exception of mild anemia which has not been different from baseline.  Patient's renal function does show decreased GFR but patient has no history of chronic kidney disease.  Patient is on Lasix daily.  Informed patient she will need to follow-up with her doctor tomorrow to have her Lasix dose adjusted.  No suspicion for CHF as patient is not tachypneic or hypoxic.  Will prescribe short course of analgesics return precautions given ? ? ? ? ? ? ?Final  Clinical Impression(s) / ED Diagnoses ?Final diagnoses:  ?None  ? ? ?Rx / DC Orders ?ED Discharge Orders   ? ? None  ? ?  ? ? ?  ?[name] Leigh, MD ?12/01/21 1445 ? ?

## 2021-12-01 NOTE — ED Notes (Signed)
[facility] said that she is 4th on the list. ?

## 2021-12-01 NOTE — Discharge Instructions (Signed)
Call your doctor today to schedule appointment to be seen.  you need to have your Lasix dose adjusted.  Return here if you become short of breath ?

## 2021-12-01 NOTE — ED Notes (Signed)
Call placed to [facility] at this time 

## 2021-12-01 NOTE — ED Triage Notes (Signed)
Pt BIB EMS from home, c/o bilateral leg pain with edema. Endorsed a burning sensation, "like walking over coal". Pt endorsed increased intake of salty foods. A&O x4, VSS ? ?Palpated SBP 148 ?P 72 ?RR 18 ?spO2 95% RA ?

## 2021-12-10 ENCOUNTER — Other Ambulatory Visit: Payer: Self-pay | Admitting: *Deleted

## 2021-12-10 ENCOUNTER — Other Ambulatory Visit: Payer: Self-pay

## 2021-12-10 NOTE — Patient Outreach (Signed)
[facility]) Care Management ?Telephonic RN Care Manager Note ? ? ?12/10/2021 ?Name:  [name] MRN:  [id number] DOB:  04-17-1956 ? ?Summary: ?Follow up outreach with continuation of assessment and home disease management education ? ?[name] reports she is doing fair  ?She and [name] out of medicine. They are pending a 3:30 pm 12/10/21 PCP appointment, their Prescriptions to be called to [facility] and then for the pharmacy to delivery the medicines. They will  use [facility] for transportation ?[name], daughter, is heard in the background with frequent increased speech. [name] stopped at intervals at the beginning of the outreach to request [name] decrease the noise. [name] was finally redirected to another room by the grand daughter ? ? ?Gout [name] and RN CM review her 12/01/21 ED visit for bilateral leg pain, paresthesias and edema  ?ED vital signs were 149/66 85 98.1 oral resp 21 SpO2 98%  ?She had 3+ pitting edema ?She reports pain when standing ?She recalls an intake of fish and pork that she feels may be triggers for her gout episode. ?She reports she has not had an episode since 2023  ?She confirms she is taking lasix as ordered plus the steroid ordered at her ED discharge ?Her son reports in the background that [name] has also had an intake of sodas ? ?Cirrhosis of liver ?[name] inquires if her cirrhosis "will cause me to die any time soon?" She reports she stopped drinking alcohol in 2023  ?She voiced appreciation of the review of cirrhosis and a home action plan for her to continue to take her medications as order, manage her diet with less sodium, more hydration ? ?Anxiety ?[name] voices increased anxiety  ?She is not listed with medication to assist  ?She was encouraged to discuss with Dr Dorian Pod today ?She wants to have an as needed medicine "to help keep me calm"  ?She reports the only respite she receives at this time is when [name] goes to her brother's  home for part of the day 2 times a week. They are still awaiting response from [facility] for possible assistance per [employer] Social worker ?Sue Tamyra [name], [name], stays at night  ? ?Vision ?She reports her vision remains the same She is scheduled to see Dr Katy Fitch in May 2023  ?She was able to find her glasses which has decreased some grayish tints but has not helped with the double vision  ? ?congestive Heart Failure (CHF) ?Has scales but can not get on them ans stand well as she reports her "thighs are too big" ?She is not walking much and is basically w/c bound ?Trouble with incontinence  ?She had assistance from home health therapy to obtain a 3-n-1 & a transfer seat for her tub ? ? ?Recommendations/Changes made from today's visit: ?Follow assessment for home management of gout, cirrhosis of liver, CHF,  ?Discussed pending care for [name], daughter and encouraged her to speak with Dr Dorian Pod about anxiety treatment including "medicine to keep me calm"  ?Review better dietary choices for gout, cirrhosis and CHF Discontinue No salt, use [name] and/or other herbs/spices, water, tea, zero drinks ?Answered cirrhosis questions ?Discussed Tart cherry, compression hose for edema ?CHF action plan reviewed ?Sent EMMI education via mail on gout, cirrhosis,  ? ? ?Subjective: ?[name] is an 66 y.o. year old female who is a primary patient of Paliwal, Himanshu, MD. The care management team was consulted for assistance with care management and/or care coordination  needs.   ? ?Telephonic RN Care Manager completed Telephone Visit today.  ? ?Objective: ? ?Medications Reviewed Today   ? ? Reviewed by Barbaraann Faster, RN (Registered Nurse) on 10/22/21 at 15  Med List Status: <None>  ? ?Medication Order Taking? Sig Documenting Provider Last Dose Status Informant  ?albuterol (VENTOLIN HFA) 108 (90 Base) MCG/ACT inhaler [id number]  Inhale 2 puffs into the lungs every 6 (six) hours as needed for wheezing or  shortness of breath. [name], [name], MD  Active   ?amLODipine (NORVASC) 10 MG tablet [id number]  Take 1 tablet (10 mg total) by mouth daily. [name], [name], MD  Active   ?anastrozole (ARIMIDEX) 1 MG tablet [id number] No Take 1 tablet (1 mg total) by mouth daily. Magrinat, Virgie Dad, MD Past Week Active Self  ?         ?Med Note Lodema Hong, [name] Aug 31, 2021  2:44 PM) 03/02/21 30DS  ?betamethasone acetate-betamethasone sodium phosphate (CELESTONE) injection 3 mg [id number]   Edrick Kins, DPM  Active   ?Biotin 10000 MCG TABS [id number] No Take 10,000 mcg by mouth daily. [provider] Past Month Active Self  ?carvedilol (COREG) 12.5 MG tablet [id number]  TAKE 1 TABLET BY MOUTH TWICE DAILY WITH MEALS Ladell Pier, MD  Active   ?         ?Med Note Lodema Hong, [name] Aug 31, 2021  2:45 PM) 05/28/21 30DS  ?cloNIDine (CATAPRES - DOSED IN MG/24 HR) 0.2 mg/24hr patch [id number]  Place 1 patch (0.2 mg total) onto the skin once a week. [name], Corrie Mckusick, MD  Active   ?Ensure Max Protein (ENSURE MAX PROTEIN) LIQD [id number]  Take 330 mLs (11 oz total) by mouth 2 (two) times daily. [name], [name], MD  Active   ?folic acid (FOLVITE) 1 MG tablet [id number] No Take 1 tablet (1 mg total) by mouth daily.  ?Patient not taking: Reported on 09/11/2021  ? [name], MD Not Taking Active Self  ?furosemide (LASIX) 20 MG tablet [id number]  Take 1 tablet (20 mg total) by mouth daily. Please make overdue appt with Dr. Gasper Sells before anymore refills. Thank you 1st attempt  ?Patient taking differently: Take 20 mg by mouth daily.  ? [name], MD  Active   ?gabapentin (NEURONTIN) 300 MG capsule [id number] No Take 1 capsule (300 mg total) by mouth at bedtime. Magrinat, Virgie Dad, MD 02/23/2021 Active Self  ?         ?Med Note Lodema Hong, [name] Aug 31, 2021  2:46 PM) 05/27/21 30DS  ?lidocaine (XYLOCAINE) 5 % ointment [id number]  Apply 1 application topically as needed. Sherrell Puller,  PA-C  Active   ?         ?Med Note Lodema Hong, [name] Aug 31, 2021  2:47 PM) 06/11/21 15DS  ?Magnesium Oxide 400 MG CAPS [id number]  Take 1 capsule (400 mg total) by mouth daily. [name], MD  Active Self  ?Multiple Vitamin (MULTIVITAMIN WITH MINERALS) TABS tablet [id number]  Take 1 tablet by mouth daily. [name], Corrie Mckusick, MD  Active   ?nystatin (MYCOSTATIN/NYSTOP) powder [id number]  Apply topically 3 (three) times daily. Apply to arms pits, fat folds, inter-trigone [name], [name], MD  Active   ?thiamine 100 MG tablet [id number]  Take 1 tablet (100 mg total) by mouth daily. [name], Corrie Mckusick, MD  Active   ?Med List Note Inez Catalina 09/01/21 3614):    ? ?  ?  ? ?  ? ? ? ?  SDOH:  (Social Determinants of Health) assessments and interventions performed:  ? ? ?Care Plan ? ?Review of patient past medical history, allergies, medications, health status, including review of consultants reports, laboratory and other test data, was performed as part of comprehensive evaluation for care management services.  ? ?Care Plan : RN Care Manager Plan of Care  ?Updates made by Barbaraann Faster, RN since 12/10/2021 12:00 AM  ?  ? ?Problem: Complex Care Coordination Needs and disease management in patient with CHF   ?Priority: High  ?Onset Date: 10/12/2021  ?  ? ?Long-Range Goal: Establish Plan of Care for Management Complex SDOH Barriers, disease management and Care Coordination Needs in patient with CHF   ?Start Date: 10/12/2021  ?This Visit's Progress: On track  ?Recent Progress: On track  ?Priority: High  ?Note:   ?Current Barriers:  ?Knowledge Deficits related to plan of care for management of CHF and seizures  ?Care Coordination needs related to Limited social support, Substance abuse issues -  alcohol, Limited education about CHF, seizures*, and Lacks knowledge of community resource: ophthalmology ?Barriers: cares for developmental delayed daughter, [name] in the home, decreases support with care of  [name], visual deficits, poor sleep  ?Admissions 1/1-14/23 acute respiratory, seizures, alcoholism ?Unsuccessful outreaches 07/20/21, 07/29/21, 10/15/21, 10/29/21, 11/05/21  ? ?RN CM Clinical Goal(s):  ?Patient wil

## 2021-12-30 ENCOUNTER — Telehealth: Payer: Self-pay

## 2021-12-30 NOTE — Telephone Encounter (Signed)
Left a message for pt to call back to schedule OV with Dr. Gasper Sells.   ?

## 2021-12-30 NOTE — Telephone Encounter (Signed)
-----   Message from Precious Gilding, RN sent at 12/30/2020  1:46 PM EDT ----- ?Regarding: FU appointment ?Make sure pt f/u by this time in 2023.  Dr. Loletha Grayer said echo in 1 year.   ? ?

## 2022-01-07 ENCOUNTER — Inpatient Hospital Stay: Payer: Medicare Other | Attending: Adult Health | Admitting: Adult Health

## 2022-01-07 ENCOUNTER — Telehealth: Payer: Self-pay | Admitting: *Deleted

## 2022-01-07 DIAGNOSIS — C50412 Malignant neoplasm of upper-outer quadrant of left female breast: Secondary | ICD-10-CM | POA: Insufficient documentation

## 2022-01-07 DIAGNOSIS — Z8041 Family history of malignant neoplasm of ovary: Secondary | ICD-10-CM | POA: Insufficient documentation

## 2022-01-07 DIAGNOSIS — Z8249 Family history of ischemic heart disease and other diseases of the circulatory system: Secondary | ICD-10-CM | POA: Insufficient documentation

## 2022-01-07 DIAGNOSIS — Z170 Estrogen receptor positive status [ER+]: Secondary | ICD-10-CM | POA: Insufficient documentation

## 2022-01-07 DIAGNOSIS — Z923 Personal history of irradiation: Secondary | ICD-10-CM | POA: Insufficient documentation

## 2022-01-07 DIAGNOSIS — Z823 Family history of stroke: Secondary | ICD-10-CM | POA: Insufficient documentation

## 2022-01-07 DIAGNOSIS — I712 Thoracic aortic aneurysm, without rupture, unspecified: Secondary | ICD-10-CM | POA: Insufficient documentation

## 2022-01-07 DIAGNOSIS — N1832 Chronic kidney disease, stage 3b: Secondary | ICD-10-CM | POA: Insufficient documentation

## 2022-01-07 DIAGNOSIS — Z818 Family history of other mental and behavioral disorders: Secondary | ICD-10-CM | POA: Insufficient documentation

## 2022-01-07 DIAGNOSIS — G4089 Other seizures: Secondary | ICD-10-CM | POA: Insufficient documentation

## 2022-01-07 DIAGNOSIS — Z79899 Other long term (current) drug therapy: Secondary | ICD-10-CM | POA: Insufficient documentation

## 2022-01-07 DIAGNOSIS — F10231 Alcohol dependence with withdrawal delirium: Secondary | ICD-10-CM | POA: Insufficient documentation

## 2022-01-07 DIAGNOSIS — D61818 Other pancytopenia: Secondary | ICD-10-CM | POA: Insufficient documentation

## 2022-01-07 DIAGNOSIS — I5032 Chronic diastolic (congestive) heart failure: Secondary | ICD-10-CM | POA: Insufficient documentation

## 2022-01-07 DIAGNOSIS — Z9012 Acquired absence of left breast and nipple: Secondary | ICD-10-CM | POA: Insufficient documentation

## 2022-01-07 DIAGNOSIS — D6481 Anemia due to antineoplastic chemotherapy: Secondary | ICD-10-CM | POA: Insufficient documentation

## 2022-01-07 DIAGNOSIS — Z86010 Personal history of colonic polyps: Secondary | ICD-10-CM | POA: Insufficient documentation

## 2022-01-07 DIAGNOSIS — Z833 Family history of diabetes mellitus: Secondary | ICD-10-CM | POA: Insufficient documentation

## 2022-01-07 DIAGNOSIS — I130 Hypertensive heart and chronic kidney disease with heart failure and stage 1 through stage 4 chronic kidney disease, or unspecified chronic kidney disease: Secondary | ICD-10-CM | POA: Insufficient documentation

## 2022-01-07 DIAGNOSIS — Z7289 Other problems related to lifestyle: Secondary | ICD-10-CM | POA: Insufficient documentation

## 2022-01-07 NOTE — Telephone Encounter (Signed)
Patient missed appt today - did not call provider desk.  ?Contacted patient - LVM that she missed appt today and that scheduler will contact her. Schedule message sent. ?

## 2022-01-08 ENCOUNTER — Other Ambulatory Visit: Payer: Self-pay | Admitting: *Deleted

## 2022-01-08 NOTE — Patient Outreach (Addendum)
[facility]) Care Management ?Telephonic RN Care Manager Note ? ? ?01/08/2022 ?Name:  [name] MRN:  [id number] DOB:  1955-10-03 ? ?Summary: ?[facility] Unsuccessful outreach [phone] ?Outreach attempt to the listed at the preferred outreach number in [place]  ?No answer. [facility] RN CM left HIPAA [facility] Portability and Accountability Act) compliant voicemail message along with CM?s contact info.  ? ?Noted patient missed a 01/07/22 oncology appointment ? ?Subjective: ?[name] is an 66 y.o. year old female who is a primary patient of Paliwal, Himanshu, MD. The care management team was consulted for assistance with care management and/or care coordination needs.   ? ?Telephonic RN Care Manager completed Telephone Visit today.  ? ?Objective: ? ?Medications Reviewed Today   ? ? Reviewed by Barbaraann Faster, RN (Registered Nurse) on 10/22/21 at 27  Med List Status: <None>  ? ?Medication Order Taking? Sig Documenting Provider Last Dose Status Informant  ?albuterol (VENTOLIN HFA) 108 (90 Base) MCG/ACT inhaler [id number]  Inhale 2 puffs into the lungs every 6 (six) hours as needed for wheezing or shortness of breath. [name], [name], MD  Active   ?amLODipine (NORVASC) 10 MG tablet [id number]  Take 1 tablet (10 mg total) by mouth daily. [name], [name], MD  Active   ?anastrozole (ARIMIDEX) 1 MG tablet [id number] No Take 1 tablet (1 mg total) by mouth daily. Magrinat, Virgie Dad, MD Past Week Active Self  ?         ?Med Note Lodema Hong, [name] Aug 31, 2021  2:44 PM) 03/02/21 30DS  ?betamethasone acetate-betamethasone sodium phosphate (CELESTONE) injection 3 mg [id number]   Edrick Kins, DPM  Active   ?Biotin 10000 MCG TABS [id number] No Take 10,000 mcg by mouth daily. [provider] Past Month Active Self  ?carvedilol (COREG) 12.5 MG tablet [id number]  TAKE 1 TABLET BY MOUTH TWICE DAILY WITH MEALS Ladell Pier, MD  Active   ?         ?Med Note Lodema Hong, [name] Aug 31, 2021  2:45 PM) 05/28/21 30DS  ?cloNIDine (CATAPRES - DOSED IN MG/24 HR) 0.2 mg/24hr patch [id number]  Place 1 patch (0.2 mg total) onto the skin once a week. [name], Corrie Mckusick, MD  Active   ?Ensure Max Protein (ENSURE MAX PROTEIN) LIQD [id number]  Take 330 mLs (11 oz total) by mouth 2 (two) times daily. [name], [name], MD  Active   ?folic acid (FOLVITE) 1 MG tablet [id number] No Take 1 tablet (1 mg total) by mouth daily.  ?Patient not taking: Reported on 09/11/2021  ? [name], MD Not Taking Active Self  ?furosemide (LASIX) 20 MG tablet [id number]  Take 1 tablet (20 mg total) by mouth daily. Please make overdue appt with Dr. Gasper Sells before anymore refills. Thank you 1st attempt  ?Patient taking differently: Take 20 mg by mouth daily.  ? [name], MD  Active   ?gabapentin (NEURONTIN) 300 MG capsule [id number] No Take 1 capsule (300 mg total) by mouth at bedtime. Magrinat, Virgie Dad, MD 02/23/2021 Active Self  ?         ?Med Note Lodema Hong, [name] Aug 31, 2021  2:46 PM) 05/27/21 30DS  ?lidocaine (XYLOCAINE) 5 % ointment [id number]  Apply 1 application topically as needed. Sherrell Puller, PA-C  Active   ?         ?Med Note Lodema Hong, [name] Aug 31, 2021  2:47 PM) 06/11/21  15DS  ?Magnesium Oxide 400 MG CAPS [id number]  Take 1 capsule (400 mg total) by mouth daily. [name], MD  Active Self  ?Multiple Vitamin (MULTIVITAMIN WITH MINERALS) TABS tablet [id number]  Take 1 tablet by mouth daily. [name], Corrie Mckusick, MD  Active   ?nystatin (MYCOSTATIN/NYSTOP) powder [id number]  Apply topically 3 (three) times daily. Apply to arms pits, fat folds, inter-trigone [name], [name], MD  Active   ?thiamine 100 MG tablet [id number]  Take 1 tablet (100 mg total) by mouth daily. [name], Corrie Mckusick, MD  Active   ?Med List Note Inez Catalina 09/01/21 5176):    ? ?  ?  ? ?  ? ? ? ?SDOH:  (Social Determinants of Health) assessments and interventions performed:  ? ? ?Care Plan ? ?Review of  patient past medical history, allergies, medications, health status, including review of consultants reports, laboratory and other test data, was performed as part of comprehensive evaluation for care management services.  ? ?There are no care plans that you recently modified to display for this patient. ?  ? ?Plan: The patient has been provided with contact information for the care management team and has been advised to call with any health related questions or concerns.  ?The care management team will reach out to the patient again over the next 30+ business days. ? ?Nena Hampe L. [name], RN, BSN, CCM ?[facility] Management Care Coordinator ?Office number [phone] ?Main [facility] number [phone] ?Fax number [phone] ? ? ? ? ? ?

## 2022-01-11 ENCOUNTER — Telehealth: Payer: Self-pay | Admitting: Adult Health

## 2022-01-11 NOTE — Telephone Encounter (Signed)
Scheduled per 5/11 in basket, message has been left with pt  ?

## 2022-01-13 ENCOUNTER — Other Ambulatory Visit: Payer: Self-pay | Admitting: *Deleted

## 2022-01-13 NOTE — Patient Outreach (Addendum)
[name]) Care Management ?Telephonic RN Care Manager Note ? ? ?01/13/2022 ?Name:  [name] MRN:  [id number] DOB:  Sep 17, 1955 ? ?Summary: ?[facility] Unsuccessful outreach ?Outreach attempt to the listed at the preferred outreach number in [place]  ?No answer. SMS notification sent with CM?s contact info. As her voice mailbox was reported full ? ? ? ?Subjective: ?[name] is an 66 y.o. year old female who is a primary patient of Paliwal, Himanshu, MD. The care management team was consulted for assistance with care management and/or care coordination needs.   ? ?Telephonic RN Care Manager completed Telephone Visit today.  ? ?Objective: ? ?Medications Reviewed Today   ? ? Reviewed by Barbaraann Faster, RN (Registered Nurse) on 10/22/21 at 15  Med List Status: <None>  ? ?Medication Order Taking? Sig Documenting Provider Last Dose Status Informant  ?albuterol (VENTOLIN HFA) 108 (90 Base) MCG/ACT inhaler [id number]  Inhale 2 puffs into the lungs every 6 (six) hours as needed for wheezing or shortness of breath. [name], [name], MD  Active   ?amLODipine (NORVASC) 10 MG tablet [id number]  Take 1 tablet (10 mg total) by mouth daily. [name], [name], MD  Active   ?anastrozole (ARIMIDEX) 1 MG tablet [id number] No Take 1 tablet (1 mg total) by mouth daily. Magrinat, Virgie Dad, MD Past Week Active Self  ?         ?Med Note Lodema Hong, [name] Aug 31, 2021  2:44 PM) 03/02/21 30DS  ?betamethasone acetate-betamethasone sodium phosphate (CELESTONE) injection 3 mg [id number]   Edrick Kins, DPM  Active   ?Biotin 10000 MCG TABS [id number] No Take 10,000 mcg by mouth daily. [provider] Past Month Active Self  ?carvedilol (COREG) 12.5 MG tablet [id number]  TAKE 1 TABLET BY MOUTH TWICE DAILY WITH MEALS Ladell Pier, MD  Active   ?         ?Med Note Lodema Hong, [name] Aug 31, 2021  2:45 PM) 05/28/21 30DS  ?cloNIDine (CATAPRES - DOSED IN MG/24 HR) 0.2 mg/24hr patch [id number]  Place 1  patch (0.2 mg total) onto the skin once a week. [name], Corrie Mckusick, MD  Active   ?Ensure Max Protein (ENSURE MAX PROTEIN) LIQD [id number]  Take 330 mLs (11 oz total) by mouth 2 (two) times daily. [name], [name], MD  Active   ?folic acid (FOLVITE) 1 MG tablet [id number] No Take 1 tablet (1 mg total) by mouth daily.  ?Patient not taking: Reported on 09/11/2021  ? [name], MD Not Taking Active Self  ?furosemide (LASIX) 20 MG tablet [id number]  Take 1 tablet (20 mg total) by mouth daily. Please make overdue appt with Dr. Gasper Sells before anymore refills. Thank you 1st attempt  ?Patient taking differently: Take 20 mg by mouth daily.  ? [name], MD  Active   ?gabapentin (NEURONTIN) 300 MG capsule [id number] No Take 1 capsule (300 mg total) by mouth at bedtime. Magrinat, Virgie Dad, MD 02/23/2021 Active Self  ?         ?Med Note Lodema Hong, [name] Aug 31, 2021  2:46 PM) 05/27/21 30DS  ?lidocaine (XYLOCAINE) 5 % ointment [id number]  Apply 1 application topically as needed. Sherrell Puller, PA-C  Active   ?         ?Med Note Lodema Hong, [name] Aug 31, 2021  2:47 PM) 06/11/21 15DS  ?Magnesium Oxide 400 MG CAPS [id number]  Take 1 capsule (400 mg total)  by mouth daily. [name], MD  Active Self  ?Multiple Vitamin (MULTIVITAMIN WITH MINERALS) TABS tablet [id number]  Take 1 tablet by mouth daily. [name], Corrie Mckusick, MD  Active   ?nystatin (MYCOSTATIN/NYSTOP) powder [id number]  Apply topically 3 (three) times daily. Apply to arms pits, fat folds, inter-trigone [name], [name], MD  Active   ?thiamine 100 MG tablet [id number]  Take 1 tablet (100 mg total) by mouth daily. [name], Corrie Mckusick, MD  Active   ?Med List Note Inez Catalina 09/01/21 8828):    ? ?  ?  ? ?  ? ? ? ?SDOH:  (Social Determinants of Health) assessments and interventions performed:  ? ? ?Care Plan ?Review of patient past medical history, allergies, medications, health status, including review of consultants reports,  laboratory and other test data, was performed as part of comprehensive evaluation for care management services.  ? ?There are no care plans that you recently modified to display for this patient. ?  ? ?Plan: The patient has been provided with contact information for the care management team and has been advised to call with any health related questions or concerns.  ?The care management team will reach out to the patient again over the next 30+ business days. ?Unsuccessful outreaches 01/08/22 & 01/13/22  ?Unsuccessful outreach letter mailed 01/13/22  ? ? ?Murlene Revell L. [name], RN, BSN, CCM ?[facility] Management Care Coordinator ?Office number [phone] ?Main [facility] number [phone] ?Fax number [phone] ? ? ? ? ? ?

## 2022-01-19 ENCOUNTER — Other Ambulatory Visit: Payer: Self-pay | Admitting: Surgery

## 2022-01-19 ENCOUNTER — Telehealth: Payer: Self-pay

## 2022-01-19 ENCOUNTER — Encounter: Payer: Self-pay | Admitting: Adult Health

## 2022-01-19 ENCOUNTER — Inpatient Hospital Stay (HOSPITAL_BASED_OUTPATIENT_CLINIC_OR_DEPARTMENT_OTHER): Payer: Medicare Other | Admitting: Adult Health

## 2022-01-19 VITALS — BP 153/88 | HR 71 | Temp 97.4°F | Resp 18 | Ht 61.0 in | Wt 297.8 lb

## 2022-01-19 DIAGNOSIS — Z170 Estrogen receptor positive status [ER+]: Secondary | ICD-10-CM | POA: Diagnosis not present

## 2022-01-19 DIAGNOSIS — Z8249 Family history of ischemic heart disease and other diseases of the circulatory system: Secondary | ICD-10-CM | POA: Diagnosis not present

## 2022-01-19 DIAGNOSIS — Z833 Family history of diabetes mellitus: Secondary | ICD-10-CM | POA: Diagnosis not present

## 2022-01-19 DIAGNOSIS — F10231 Alcohol dependence with withdrawal delirium: Secondary | ICD-10-CM | POA: Diagnosis not present

## 2022-01-19 DIAGNOSIS — I130 Hypertensive heart and chronic kidney disease with heart failure and stage 1 through stage 4 chronic kidney disease, or unspecified chronic kidney disease: Secondary | ICD-10-CM | POA: Diagnosis not present

## 2022-01-19 DIAGNOSIS — Z7289 Other problems related to lifestyle: Secondary | ICD-10-CM | POA: Diagnosis not present

## 2022-01-19 DIAGNOSIS — C50412 Malignant neoplasm of upper-outer quadrant of left female breast: Secondary | ICD-10-CM | POA: Diagnosis present

## 2022-01-19 DIAGNOSIS — D6481 Anemia due to antineoplastic chemotherapy: Secondary | ICD-10-CM | POA: Diagnosis not present

## 2022-01-19 DIAGNOSIS — I5032 Chronic diastolic (congestive) heart failure: Secondary | ICD-10-CM | POA: Diagnosis not present

## 2022-01-19 DIAGNOSIS — Z923 Personal history of irradiation: Secondary | ICD-10-CM | POA: Diagnosis not present

## 2022-01-19 DIAGNOSIS — D61818 Other pancytopenia: Secondary | ICD-10-CM | POA: Diagnosis not present

## 2022-01-19 DIAGNOSIS — G4089 Other seizures: Secondary | ICD-10-CM | POA: Diagnosis not present

## 2022-01-19 DIAGNOSIS — I712 Thoracic aortic aneurysm, without rupture, unspecified: Secondary | ICD-10-CM | POA: Diagnosis not present

## 2022-01-19 DIAGNOSIS — Z818 Family history of other mental and behavioral disorders: Secondary | ICD-10-CM | POA: Diagnosis not present

## 2022-01-19 DIAGNOSIS — Z823 Family history of stroke: Secondary | ICD-10-CM | POA: Diagnosis not present

## 2022-01-19 DIAGNOSIS — Z9012 Acquired absence of left breast and nipple: Secondary | ICD-10-CM | POA: Diagnosis not present

## 2022-01-19 DIAGNOSIS — Z86010 Personal history of colonic polyps: Secondary | ICD-10-CM | POA: Diagnosis not present

## 2022-01-19 DIAGNOSIS — N1832 Chronic kidney disease, stage 3b: Secondary | ICD-10-CM | POA: Diagnosis not present

## 2022-01-19 DIAGNOSIS — Z79899 Other long term (current) drug therapy: Secondary | ICD-10-CM | POA: Diagnosis not present

## 2022-01-19 DIAGNOSIS — Z8041 Family history of malignant neoplasm of ovary: Secondary | ICD-10-CM | POA: Diagnosis not present

## 2022-01-19 NOTE — Telephone Encounter (Signed)
Attempted to call pt to r/s appt she [facility] for. Pt continues to [place] appts. Letter generated and mailed to pt; also fwd'ed to out LCSW's in office at [facility] so they can reach out to pt. LVM for pt to return calls.

## 2022-01-19 NOTE — Progress Notes (Signed)
[facility] Follow up:    Sue Pardon, MD [place] [address]   DIAGNOSIS:  Cancer Staging  Malignant neoplasm of upper-outer quadrant of left breast in female, estrogen receptor positive ([place]) Staging form: Breast, AJCC 8th Edition - Clinical stage from 11/21/2019: Stage IIIA (cT2, cN1(f), cM0, G3, ER+, PR-, HER2-) - Unsigned Stage prefix: Initial diagnosis Method of lymph node assessment: Core biopsy Histologic grading system: 3 grade system - Pathologic stage from 12/19/2019: Stage IIA (pT2, pN1a, cM0, G3, ER+, PR+, HER2-) - Signed by Gardenia Phlegm, NP on 01/02/2020 Stage prefix: Initial diagnosis Histologic grading system: 3 grade system   SUMMARY OF ONCOLOGIC HISTORY: 66 y.o. [name] woman status post left breast upper outer quadrant and left axillary lymph node biopsies 11/12/2019 for a clinical T2 N2, stage IIIA invasive ductal carcinoma, grade 3, estrogen and progesterone receptor positive, [name]-2 not amplified, with an MIB-1 of 70%             (a) Sue biopsied axillary lymph node was positive   (1) genetics testing 11/29/2019 through Sue Breast Cancer STAT Panel offered by [facility] found no deleterious mutations in ATM, BRCA1, BRCA2, CDH1, CHEK2, PALB2, PTEN, STK11 and TP53.  Additional testing through Sue Common Hereditary Cancers Panel confirmed no deleterious mutations in APC, ATM, AXIN2, BARD1, BMPR1A, BRCA1, BRCA2, BRIP1, CDH1, CDK4, CDKN2A (p14ARF), CDKN2A (p16INK4a), CHEK2, CTNNA1, DICER1, EPCAM (Deletion/duplication testing only), GREM1 (promoter region deletion/duplication testing only), KIT, MEN1, MLH1, MSH2, MSH3, MSH6, MUTYH, NBN, NF1, NHTL1, PALB2, PDGFRA, PMS2, POLD1, POLE, PTEN, RAD50, RAD51C, RAD51D, RNF43, SDHB, SDHC, SDHD, SMAD4, SMARCA4. STK11, TP53, TSC1, TSC2, and VHL.  Sue following genes were evaluated for sequence changes only: SDHA and HOXB13 c.251G>A variant only.   (2) status post left modified radical  mastectomy 12/19/2019 for a pT2 pN1, stage IIB invasive ductal carcinoma, grade 3, with negative margins.             (a) 1 of 13 axillary lymph nodes removed was positive   (3) adjuvant chemotherapy will consist of cyclophosphamide and doxorubicin in dose dense fashion x4 starting 01/22/2020 followed by weekly paclitaxel x12             (a) baseline echocardiogram 11/26/2019 shows an ejection fraction in Sue 65-70% range.             (b) first cycle of cyclophosphamide and doxorubicin dose reduced to assess tolerance             (c) chemotherapy discontinued after 2 cycles because of poor tolerance   (4) adjuvant radiation completed 05/21/2020   (5) anastrozole started July 2021    CURRENT THERAPY: Anastrozole  INTERVAL HISTORY: [name] 66 y.o. female returns for follow-up of [name] history of estrogen and progesterone positive breast cancer.  [name] tells me that [name] has missed several appointments because [name] had swelling in [name] brain.  Upon review of Sue notes this was related to acute alcohol withdrawal.  [name] doing well today.  [name] tells me that [name] living at home in [name] apartment which [name] independent living at [place] and [name] working with physical therapy daily to improve [name] strength since [name] intubation that occurred at Sue beginning of this year.  [name] has significant transportation difficulties because [name] unable to stand and walk to self transfer into a car so many times transportation will have to pick [name] up and [name] weighs 300 pounds.  [name] denies any new physical  concerns with [name] breasts.  [name] left chest wall has no skin changes and [name] denies any lymphadenopathy.   [name] Active Problem List   Diagnosis Date Noted   Cirrhosis of liver without ascites ([place])    Alcohol withdrawal seizure with delirium ([place]) 08/31/2021   Acute respiratory failure with hypoxia ([place]) 08/31/2021   UTI (urinary tract infection) 08/31/2021   Alcohol withdrawal seizure ([place])  08/31/2021   Benign neoplasm of colon    Atypical glandular cells on cervical Pap smear 86/76/7209   Acute metabolic encephalopathy 47/04/6282   COVID-19 virus infection 08/01/2020   COVID-19 08/01/2020   Chronic diastolic CHF (congestive heart failure) ([place]) 06/26/2020   Thoracic aortic aneurysm without rupture ([place]) 06/26/2020   Hyponatremia 05/31/2020   Pancytopenia ([place]) 05/31/2020   Cellulitis of thigh 04/29/2020   Tinea corporis 04/29/2020   Anemia of chronic disease 04/29/2020   Chronic kidney disease, stage 3a ([place]) 04/29/2020   Cellulitis of abdominal wall    Constipation    Folate deficiency    Hypomagnesemia    Debility    Gout 03/03/2020   Skin breakdown 03/03/2020   Skin ulcer of perineum, limited to breakdown of skin ([place]) 03/03/2020   Leukocytosis 03/03/2020   Anemia associated with chemotherapy 03/03/2020   Thrombocytopenia ([place]) 03/03/2020   [name]-A-Cath in place 02/19/2020   Acute renal failure (ARF) ([place]) 01/22/2020   S/P left mastectomy 12/19/2019   Colon cancer screening 11/30/2019   Morbid obesity with BMI of 60.0-69.9, adult ([place]) 11/21/2019   Family history of ovarian cancer    Malignant neoplasm of upper-outer quadrant of left breast in female, estrogen receptor positive ([place]) 11/14/2019   Fracture of radial head, right, closed 06/25/2019   Frequent falls 01/10/2019   Bilateral bunions 01/10/2019   Toenail fungus 66/29/4765   Alcoholic hepatitis 46/50/3546   Edema 11/20/2018   Alcohol-induced mood disorder ([place]) 11/01/2013   Alcohol dependence ([place]) 11/01/2013   Essential hypertension 06/20/2007   LOW BACK PAIN 06/20/2007   DIVERTICULOSIS, COLON 05/05/2007    [name] allergic to amlodipine.  MEDICAL HISTORY: Past Medical History:  Diagnosis Date   Alcohol dependence ([place])    Aortic atherosclerosis ([place])    Arthritis    Cancer ([place]) 11/2019   Left breast   Chronic heart failure (HCC)    Chronic kidney disease, stage 3 ([place])    COVID-19     Diverticulitis    Family history of ovarian cancer    Heart murmur    11/26/19 echo: Mild AS. AV mean gradient 12.0 mmHg; however, LVOT gradient 8 mmHg with intracvitary gradient-significant AS [name] not suspected   Hypertension    Right nephrolithiasis    Thoracic aortic aneurysm (HCC)    Tubular adenoma polyp of rectum     SURGICAL HISTORY: Past Surgical History:  Procedure Laterality Date   COLONOSCOPY WITH PROPOFOL N/A 02/24/2021   Procedure: COLONOSCOPY WITH PROPOFOL;  Surgeon: Yetta Flock, MD;  Location[facility];  Service: Gastroenterology;  Laterality: N/A;   MASTECTOMY WITH AXILLARY LYMPH NODE DISSECTION Left 12/19/2019   Procedure: LEFT MASTECTOMY WITH AXILLARY LYMPH NODE DISSECTION;  Surgeon: [name], MD;  Location: [facility];  Service: General;  Laterality: Left;   MULTIPLE EXTRACTIONS WITH ALVEOLOPLASTY Bilateral 12/14/2019   Procedure: MULTIPLE EXTRACTION WITH ALVEOLOPLASTY;  Surgeon: [name], DDS;  Location: [facility];  Service: Oral Surgery;  Laterality: Bilateral;   POLYPECTOMY  02/24/2021   Procedure: POLYPECTOMY;  Surgeon: Yetta Flock, MD;  Location: [facility];  Service: Gastroenterology;;  PORTACATH PLACEMENT N/A 12/19/2019   Procedure: INSERTION [name]-A-CATH WITH ULTRASOUND GUIDANCE;  Surgeon: [name], MD;  Location: [facility];  Service: General;  Laterality: N/A;   RADIAL HEAD ARTHROPLASTY Right 06/25/2019   Procedure: RIGHT RADIAL HEAD ARTHROPLASTY;  Surgeon: [name], MD;  Location: [facility];  Service: Orthopedics;  Laterality: Right;   TUBAL LIGATION      SOCIAL HISTORY: Social History   Socioeconomic History   Marital status: Legally Separated    Spouse name: Not on file   Number of children: Not on file   Years of education: Not on file   Highest education level: Not on file  Occupational History   Not on file  Tobacco Use   Smoking status: Never   Smokeless tobacco: Never  Vaping Use   Vaping Use: Never used   Substance and Sexual Activity   Alcohol use: Yes    Alcohol/week: 3.0 standard drinks    Types: 1 Cans of beer, 2 Shots of liquor per week    Comment: daily   Drug use: Yes    Types: Marijuana   Sexual activity: Not Currently  Other Topics Concern   Not on file  Social History Narrative   Grand daughter stays with [name]   [name] cares for [name] 62 developmental delayed daughter [name] with mental health issues at home - Previous was at a group home   Social Determinants of Health   Financial Resource Strain: Not on file  Food Insecurity: No Food Insecurity   Worried About Charity fundraiser in Sue Last Year: Never true   Arboriculturist in [place] Last Year: Never true  Transportation Needs: No Transportation Needs   Lack of Transportation (Medical): No   Lack of Transportation (Non-Medical): No  Physical Activity: Not on file  Stress: Stress Concern Present   Feeling of Stress : Rather much  Social Connections: Moderately Integrated   Frequency of Communication with Friends and Family: More than three times a week   Frequency of Social Gatherings with Friends and Family: More than three times a week   Attends Religious Services: 1 to 4 times per year   Active Member of [employer] or Organizations: Yes   Attends Archivist Meetings: 1 to 4 times per year   Marital Status: Separated  Intimate Partner Violence: Not At Risk   Fear of Current or Ex-Partner: No   Emotionally Abused: No   Physically Abused: No   Sexually Abused: No    FAMILY HISTORY: Family History  Problem Relation Age of Onset   Hypertension Mother    Dementia Mother    Ovarian cancer Half-Sister 15   Diabetes Half-Sister    Stroke Half-Sister     Review of Systems  Constitutional:  Negative for appetite change, chills, fatigue, fever and unexpected weight change.  HENT:   Negative for hearing loss, lump/mass and trouble swallowing.   Eyes:  Negative for eye problems and icterus.  Respiratory:   Negative for chest tightness, cough and shortness of breath.   Cardiovascular:  Negative for chest pain, leg swelling and palpitations.  Gastrointestinal:  Negative for abdominal distention, abdominal pain, constipation, diarrhea, nausea and vomiting.  Endocrine: Negative for hot flashes.  Genitourinary:  Negative for difficulty urinating.   Musculoskeletal:  Negative for arthralgias.  Skin:  Negative for itching and rash.  Neurological:  Negative for dizziness, extremity weakness, headaches and numbness.  Hematological:  Negative for adenopathy. Does not bruise/bleed easily.  Psychiatric/Behavioral:  Negative for depression. [name] [name] not nervous/anxious.      PHYSICAL EXAMINATION  ECOG PERFORMANCE STATUS: 4 - Bedbound  Vitals:   01/19/22 1111  BP: (!) 153/88  Pulse: 71  Resp: 18  Temp: (!) 97.4 F (36.3 C)  SpO2: 96%    Physical Exam Constitutional:      General: [name] not in acute distress.    Appearance: Normal appearance. [name] not toxic-appearing.  HENT:     Head: Normocephalic and atraumatic.  Eyes:     General: No scleral icterus. Cardiovascular:     Rate and Rhythm: Normal rate and regular rhythm.     Pulses: Normal pulses.     Heart sounds: Normal heart sounds.  Pulmonary:     Effort: Pulmonary effort [name] normal.     Breath sounds: Normal breath sounds.  Chest:     Comments: Examined in wheelchair therefore limited exam, left chest wall has no nodules or sign of recurrence right breast [name] benign. Abdominal:     General: Abdomen [name] flat. Bowel sounds are normal. There [name] no distension.     Palpations: Abdomen [name] soft.     Tenderness: There [name] no abdominal tenderness.  Musculoskeletal:        General: No swelling.     Cervical back: Neck supple.  Lymphadenopathy:     Cervical: No cervical adenopathy.  Skin:    General: Skin [name] warm and dry.     Findings: No rash.  Neurological:     General: No focal deficit present.     Mental Status: [name]  alert.  Psychiatric:        Mood and Affect: Mood normal.        Behavior: Behavior normal.    LABORATORY DATA:  CBC    Component Value Date/Time   WBC 5.0 12/01/2021 1151   RBC 3.56 (L) 12/01/2021 1151   HGB 10.8 (L) 12/01/2021 1151   HGB 9.8 (L) 10/16/2020 1547   HCT 33.2 (L) 12/01/2021 1151   HCT 29.8 (L) 10/16/2020 1547   PLT 170 12/01/2021 1151   PLT 236 10/16/2020 1547   MCV 93.3 12/01/2021 1151   MCV 94 10/16/2020 1547   MCH 30.3 12/01/2021 1151   MCHC 32.5 12/01/2021 1151   RDW 13.5 12/01/2021 1151   RDW 14.0 10/16/2020 1547   LYMPHSABS 1.2 12/01/2021 1151   LYMPHSABS 1.5 03/25/2020 1103   MONOABS 0.4 12/01/2021 1151   EOSABS 0.2 12/01/2021 1151   EOSABS 0.4 03/25/2020 1103   BASOSABS 0.0 12/01/2021 1151   BASOSABS 0.0 03/25/2020 1103    CMP     Component Value Date/Time   NA 138 12/01/2021 1151   NA 138 06/26/2020 0843   K 3.2 (L) 12/01/2021 1151   CL 110 12/01/2021 1151   CO2 23 12/01/2021 1151   GLUCOSE 94 12/01/2021 1151   BUN 25 (H) 12/01/2021 1151   BUN 23 06/26/2020 0843   CREATININE 1.79 (H) 12/01/2021 1151   CREATININE 1.59 (H) 11/21/2019 0908   CALCIUM 8.2 (L) 12/01/2021 1151   PROT 6.9 09/08/2021 1207   PROT 8.5 06/15/2019 1622   ALBUMIN 2.8 (L) 09/08/2021 1207   ALBUMIN 4.3 06/15/2019 1622   AST 35 09/08/2021 1207   AST 61 (H) 11/21/2019 0908   ALT 24 09/08/2021 1207   ALT 32 11/21/2019 0908   ALKPHOS 59 09/08/2021 1207   BILITOT 0.7 09/08/2021 1207   BILITOT 0.5 11/21/2019 0908   GFRNONAA 31 (L) 12/01/2021  [place] (L) 11/21/2019 0908   GFRAA 40 (L) 06/26/2020 0843   GFRAA 39 (L) 11/21/2019 0908      ASSESSMENT and THERAPY PLAN:   Malignant neoplasm of upper-outer quadrant of left breast in female, estrogen receptor positive [facility]) Simona Huh [name] a 66 year old woman who was diagnosed in March 2021 with stage IIIa invasive ductal carcinoma estrogen and progesterone positive [name] status post left mastectomy, adjuvant  chemotherapy, adjuvant radiation, and began anastrozole beginning July 2021.  [name] doing quite well today.  [name] has no clinical or radiographic sign of breast cancer recurrence.  [name] continues on anastrozole with good tolerance.  I recommended that [name] continue this.  I recommended that [name] continue with right breast screening mammograms and that [name] continue to see primary care regularly.  [name] notes that [name] exercising at home and I have recommended continued healthy diet and exercise.  [name] will return in 6 months for follow-up with Dr. Chryl Heck [name] and I sent Dr. Ninfa Linden a message to see if he was able to remove this.   All questions were answered. [name] knows to call Sue clinic with any problems, questions or concerns. We can certainly see [name] much sooner if necessary.  Total encounter time:30 minutes*in face-to-face visit time, chart review, lab review, care coordination, order entry, and documentation of Sue encounter time.   Wilber Bihari, NP 01/19/22 11:35 AM Medical Oncology and Hematology [facility] [place], [place] [address] Tel. [phone]    Fax. [phone]  *Total Encounter Time as defined by Sue Centers for Medicare and Medicaid Services includes, in addition to Sue face-to-face time of a [name] visit (documented in Sue note above) non-face-to-face time: obtaining and reviewing outside history, ordering and reviewing medications, tests or procedures, care coordination (communications with other health care professionals or caregivers) and documentation in Sue medical record.

## 2022-01-19 NOTE — Assessment & Plan Note (Signed)
[name] is a 67 year old woman who was diagnosed in March 2021 with stage IIIa invasive ductal carcinoma estrogen and progesterone positive she is status post left mastectomy, adjuvant chemotherapy, adjuvant radiation, and began anastrozole beginning July 2021.  She is doing quite well today.  She has no clinical or radiographic sign of breast cancer recurrence.  She continues on anastrozole with good tolerance.  I recommended that she continue this.  I recommended that she continue with right breast screening mammograms and that she continue to see primary care regularly.  She notes that she is exercising at home and I have recommended continued healthy diet and exercise.  [name] will return in 6 months for follow-up with Dr. Chryl Heck she still has her port and I sent Dr. Ninfa Linden a message to see if he was able to remove this.

## 2022-01-20 ENCOUNTER — Other Ambulatory Visit: Payer: Self-pay | Admitting: *Deleted

## 2022-01-20 ENCOUNTER — Telehealth: Payer: Self-pay | Admitting: Adult Health

## 2022-01-20 NOTE — Patient Outreach (Signed)
[facility]) Care Management  01/20/2022  [name] 1955/12/23 [id number]   [place] coordination- collaboration with oncology SW   Received in basket message form oncology SW about pt need for transportation assistance to appointments Updated oncology SW of pt pending [facility] case closure as noted in the below plan Encouraged SW to assist pt if possible and RN CM will attempt to also assist if pt returns a call. With the previous outreaches to this patient she has previously confirmed she is aware of the [employer] transportation as she and her daughter both access the services to go to pcp office   Plan: The patient has been provided with contact information for the care management team and has been advised to call with any health related questions or concerns.  The care management team will pend the patient for [facility] case closure if no return call per [facility] workflow  Unsuccessful outreaches 01/08/22 & 01/13/22 & 01/20/22 Unsuccessful outreach letter mailed 01/13/22   Joelene Millin L. [name], RN, BSN, [place] Coordinator Office number [phone]

## 2022-01-20 NOTE — Patient Outreach (Signed)
[facility]) Care Management  01/20/2022  [name] [birth year] [id number]   [facility] Unsuccessful Outreach Summary: [facility] Unsuccessful outreach Outreach attempt to the listed at the preferred outreach number in EPIC  No answer. SMS notification sent with CM's contact info. As her voice mailbox was reported full  Plan: The patient has been provided with contact information for the care management team and has been advised to call with any health related questions or concerns.  The care management team will pend the patient for [facility] case closure if no return call per [facility] workflow  Unsuccessful outreaches 01/08/22 & 01/13/22 & 01/20/22 Unsuccessful outreach letter mailed 01/13/22      Joelene Millin L. [name], RN, BSN, [place] Coordinator Office number [phone] [facility] number [phone] Fax number [phone]

## 2022-01-20 NOTE — Telephone Encounter (Signed)
Scheduled appointment per 5/23 los. Unable to leave a message due to mailbox being full. Patient will be mailed an updated calendar.

## 2022-01-21 ENCOUNTER — Encounter: Payer: Self-pay | Admitting: Licensed Clinical Social Worker

## 2022-01-21 NOTE — Progress Notes (Signed)
[place] Work  Clinical Social Work was referred by nurse for assessment of psychosocial needs.  Clinical Social Worker attempted to contact patient by phone  to offer support and assess for needs related to transportation. No answer, VM full so unable to leave message. CSW will mail a letter with information on transportation benefits through patient's insurance.      [place], [place] Worker [employer]

## 2022-02-02 ENCOUNTER — Other Ambulatory Visit: Payer: Self-pay | Admitting: Surgery

## 2022-02-03 ENCOUNTER — Other Ambulatory Visit: Payer: Self-pay | Admitting: *Deleted

## 2022-02-03 NOTE — Patient Outreach (Signed)
[facility]) Care Management  02/03/2022  [name] 08-16-1956 [id number]   [facility] Case closure unable to maintain contact  Unsuccessful outreaches 01/08/22 & 01/13/22 & 01/20/22 Unsuccessful outreach letter mailed 01/13/22 without a response   Plan [facility] RN CM will close case after no response from patient within 10 business days. Unable to maintain contact Case closure letters sent to patient and MD   Care Plan : RN Care Manager Plan of Care  Updates made by Clinton Gallant, RN since 02/03/2022 12:00 AM  Completed 02/03/2022   Problem: Complex Care Coordination Needs and disease management in patient with CHF Resolved 02/03/2022  Priority: High  Onset Date: 10/12/2021     Long-Range Goal: Establish Plan of Care for Management Complex SDOH Barriers, disease management and Care Coordination Needs in patient with CHF Completed 02/03/2022  Start Date: 10/12/2021  Recent Progress: On track  Priority: High  Note:   Current Barriers:  Knowledge Deficits related to plan of care for management of CHF and seizures  Care Coordination needs related to Limited social support, Substance abuse issues -  alcohol, Limited education about CHF, seizures*, and Lacks knowledge of community resource: ophthalmology Barriers: cares for developmental delayed daughter, Britta Mccreedy in the home, decreases support with care of Britta Mccreedy, visual deficits, poor sleep  Admissions 1/1-14/23 acute respiratory, seizures, alcoholism Unsuccessful outreaches 07/20/21, 07/29/21, 10/15/21, 10/29/21, 11/05/21 01/08/22,  01/13/22 , 01/20/22 Unsuccessful outreach letter mailed 01/13/22   RN CM Clinical Goal(s):  Patient will demonstrate Improved adherence to prescribed treatment plan for CHF and seizures as evidenced by reduced risk of re admission, CHF & seizures s/s  through collaboration with RN Care manager, provider, and care team.   Interventions: Outreaches for care coordination, disease management, resources, home  care/education needs Inter-disciplinary care team collaboration (see longitudinal plan of care) Evaluation of current treatment plan related to  self management and patient's adherence to plan as established by provider 12/10/21 assessment for home management of gout, cirrhosis of liver, CHF Review better dietary choices for gout, cirrhosis and CHF Discontinue No salt, use Mrs Sharilyn Sites and/or other herbs/spices, water, tea, zero drinks Answered cirrhosis questions Discussed Tart cherry, compression hose for edema Sent EMMI education via mail on gout, cirrhosis,   Heart Failure Interventions:  (Status:  Patient declined further engagement on this goal.) Long Term Goal Basic overview and discussion of pathophysiology of Heart Failure reviewed Provided education on low sodium diet Reviewed Heart Failure Action Plan in depth and provided written copy Assessed need for readable accurate scales in home Discussed importance of daily weight and advised patient to weigh and record daily Reviewed role of diuretics in prevention of fluid overload and management of heart failure; Discussed the importance of keeping all appointments with provider Advised patient to discuss home health services with provider Screening for signs and symptoms of depression related to chronic disease state  Assessed social determinant of health barriers    Health Maintenance Interventions:  (Status:  Goal Met.) Short Term Goal Patient interviewed about adult health maintenance status including  Regular eye checkups 10/22/21 assisted with conference call to Dr Dione Booze to schedule appointment for 10/27/21 0930 - Goal met Pt attend eye MD (Groat) visit, follow up in 3 months . She reports an un determined diagnosis and pending cataract procedure after further "research". her [facility] RN examined her and concluded she has some visual issues with "my eyes not tracking" 12/10/21 She reports her vision remains the same She is scheduled to see Dr  Groat in May 2023 now using her glasses with minimal change in vision   Interdisciplinary Collaboration Interventions:  (Status: Goal Met.) Short Term Goal   Collaborated with DSS SW/APS, L Moore to initiate plan of care to address needs related to Limited social support in patient with CHF and seizures Collaboration with [name], MD office staff regarding pending status of home health services patient was informed initiated by [place] at sn[facility] discharge Attempt to find possible skilled nursing discharge information via bamboo/PING unsuccessful 11/12/21 Spoke with Vickii Chafe at her primary care provider office to leave a message for pcp staff about her home health agency   Spoke with Mrs [facility] SW Mr Lanelle Bal about her home health services (? [facility]), [facility] goals (home management of medical diagnoses, vision, DSS staff) Mr Lanelle Bal attempted to assist pt with Personal care services (PCS) but pt is not eligible   Seizures  (Status:  Patient declined further engagement on this goal.)  Short Term Goal Evaluation of current treatment plan related to  seizures , Limited education about seizures* self-management and patient's adherence to plan as established by provider. Discussed plans with patient for ongoing care management follow up and provided patient with direct contact information for care management team Evaluation of current treatment plan related to seizures and patient's adherence to plan as established by provider Reviewed medications with patient and discussed seizures 11/12/21 home health services have started, has pcp SW services. Not eligible for personal care services via DSS Not aware of DSS staff assigned to her at this time 12/10/21 [facility] continues  Patient Goals/Self-Care Activities: Take all medications as prescribed Attend all scheduled provider appointments Call pharmacy for medication refills 3-7 days in advance of running out of medications Perform all self  care activities independently  Call provider office for new concerns or questions  Work with the social worker to address care coordination needs and will continue to work with the clinical team to address health care and disease management related needs 12/10/21 She was able to find her glasses which has decreased some grayish tints but has not helped with the double vision   Follow Up Plan:  The patient has been provided with contact information for the care management team and has been advised to call with any health related questions or concerns.  No further follow up required:  02/03/22 case closure unable to maintain contact       [place]. [name], RN, BSN, [name] Coordinator Office number [phone] [facility] number [phone] Fax number [phone]

## 2022-02-04 ENCOUNTER — Other Ambulatory Visit: Payer: Self-pay

## 2022-02-04 ENCOUNTER — Emergency Department (HOSPITAL_COMMUNITY): Payer: Medicare Other

## 2022-02-04 ENCOUNTER — Encounter (HOSPITAL_COMMUNITY): Payer: Self-pay

## 2022-02-04 ENCOUNTER — Inpatient Hospital Stay (HOSPITAL_COMMUNITY)
Admission: EM | Admit: 2022-02-04 | Discharge: 2022-02-07 | DRG: 896 | Disposition: A | Discharge status: Home / Self Care | Payer: Medicare Other | Attending: Internal Medicine | Admitting: Internal Medicine

## 2022-02-04 DIAGNOSIS — K7030 Alcoholic cirrhosis of liver without ascites: Secondary | ICD-10-CM

## 2022-02-04 DIAGNOSIS — I10 Essential (primary) hypertension: Secondary | ICD-10-CM | POA: Diagnosis present

## 2022-02-04 DIAGNOSIS — Z8249 Family history of ischemic heart disease and other diseases of the circulatory system: Secondary | ICD-10-CM

## 2022-02-04 DIAGNOSIS — R569 Unspecified convulsions: Secondary | ICD-10-CM

## 2022-02-04 DIAGNOSIS — Z9012 Acquired absence of left breast and nipple: Secondary | ICD-10-CM

## 2022-02-04 DIAGNOSIS — F10131 Alcohol abuse with withdrawal delirium: Secondary | ICD-10-CM | POA: Diagnosis not present

## 2022-02-04 DIAGNOSIS — I5032 Chronic diastolic (congestive) heart failure: Secondary | ICD-10-CM | POA: Diagnosis present

## 2022-02-04 DIAGNOSIS — Z7952 Long term (current) use of systemic steroids: Secondary | ICD-10-CM

## 2022-02-04 DIAGNOSIS — E876 Hypokalemia: Secondary | ICD-10-CM | POA: Diagnosis not present

## 2022-02-04 DIAGNOSIS — G319 Degenerative disease of nervous system, unspecified: Secondary | ICD-10-CM | POA: Diagnosis present

## 2022-02-04 DIAGNOSIS — Z8041 Family history of malignant neoplasm of ovary: Secondary | ICD-10-CM

## 2022-02-04 DIAGNOSIS — Z170 Estrogen receptor positive status [ER+]: Secondary | ICD-10-CM

## 2022-02-04 DIAGNOSIS — Z853 Personal history of malignant neoplasm of breast: Secondary | ICD-10-CM

## 2022-02-04 DIAGNOSIS — F10939 Alcohol use, unspecified with withdrawal, unspecified: Secondary | ICD-10-CM

## 2022-02-04 DIAGNOSIS — Z833 Family history of diabetes mellitus: Secondary | ICD-10-CM

## 2022-02-04 DIAGNOSIS — Z923 Personal history of irradiation: Secondary | ICD-10-CM

## 2022-02-04 DIAGNOSIS — Z6841 Body Mass Index (BMI) 40.0 and over, adult: Secondary | ICD-10-CM

## 2022-02-04 DIAGNOSIS — C50412 Malignant neoplasm of upper-outer quadrant of left female breast: Secondary | ICD-10-CM

## 2022-02-04 DIAGNOSIS — Z8616 Personal history of COVID-19: Secondary | ICD-10-CM

## 2022-02-04 DIAGNOSIS — I700 Atherosclerosis of aorta: Secondary | ICD-10-CM | POA: Diagnosis present

## 2022-02-04 DIAGNOSIS — D631 Anemia in chronic kidney disease: Secondary | ICD-10-CM | POA: Diagnosis present

## 2022-02-04 DIAGNOSIS — I130 Hypertensive heart and chronic kidney disease with heart failure and stage 1 through stage 4 chronic kidney disease, or unspecified chronic kidney disease: Secondary | ICD-10-CM | POA: Diagnosis present

## 2022-02-04 DIAGNOSIS — F10931 Alcohol use, unspecified with withdrawal delirium: Secondary | ICD-10-CM | POA: Diagnosis not present

## 2022-02-04 DIAGNOSIS — D638 Anemia in other chronic diseases classified elsewhere: Secondary | ICD-10-CM | POA: Diagnosis present

## 2022-02-04 DIAGNOSIS — F1020 Alcohol dependence, uncomplicated: Secondary | ICD-10-CM | POA: Diagnosis present

## 2022-02-04 DIAGNOSIS — E872 Acidosis, unspecified: Secondary | ICD-10-CM | POA: Diagnosis present

## 2022-02-04 DIAGNOSIS — N1832 Chronic kidney disease, stage 3b: Secondary | ICD-10-CM

## 2022-02-04 DIAGNOSIS — I712 Thoracic aortic aneurysm, without rupture, unspecified: Secondary | ICD-10-CM | POA: Diagnosis present

## 2022-02-04 DIAGNOSIS — Z823 Family history of stroke: Secondary | ICD-10-CM

## 2022-02-04 DIAGNOSIS — Z888 Allergy status to other drugs, medicaments and biological substances status: Secondary | ICD-10-CM

## 2022-02-04 DIAGNOSIS — G4089 Other seizures: Secondary | ICD-10-CM | POA: Diagnosis present

## 2022-02-04 DIAGNOSIS — Z79899 Other long term (current) drug therapy: Secondary | ICD-10-CM

## 2022-02-04 DIAGNOSIS — G9341 Metabolic encephalopathy: Secondary | ICD-10-CM | POA: Diagnosis present

## 2022-02-04 LAB — COMPREHENSIVE METABOLIC PANEL
ALT: 11 U/L (ref 0–44)
AST: 20 U/L (ref 15–41)
Albumin: 3.7 g/dL (ref 3.5–5.0)
Alkaline Phosphatase: 47 U/L (ref 38–126)
Anion gap: 15 (ref 5–15)
BUN: 33 mg/dL — ABNORMAL HIGH (ref 8–23)
CO2: 17 mmol/L — ABNORMAL LOW (ref 22–32)
Calcium: 8.6 mg/dL — ABNORMAL LOW (ref 8.9–10.3)
Chloride: 112 mmol/L — ABNORMAL HIGH (ref 98–111)
Creatinine, Ser: 1.78 mg/dL — ABNORMAL HIGH (ref 0.44–1.00)
GFR, Estimated: 31 mL/min — ABNORMAL LOW (ref >60)
Glucose, Bld: 73 mg/dL (ref 70–99)
Potassium: 3.5 mmol/L (ref 3.5–5.1)
Sodium: 144 mmol/L (ref 135–145)
Total Bilirubin: 0.6 mg/dL (ref 0.3–1.2)
Total Protein: 7.9 g/dL (ref 6.5–8.1)

## 2022-02-04 LAB — PROTIME-INR
INR: 1.2 (ref 0.8–1.2)
Prothrombin Time: 14.8 seconds (ref 11.4–15.2)

## 2022-02-04 LAB — CBC WITH DIFFERENTIAL/PLATELET
Abs Immature Granulocytes: 0.04 10*3/uL (ref 0.00–0.07)
Basophils Absolute: 0 10*3/uL (ref 0.0–0.1)
Basophils Relative: 0 %
Eosinophils Absolute: 0.1 10*3/uL (ref 0.0–0.5)
Eosinophils Relative: 1 %
HCT: 31.2 % — ABNORMAL LOW (ref 36.0–46.0)
Hemoglobin: 9.9 g/dL — ABNORMAL LOW (ref 12.0–15.0)
Immature Granulocytes: 1 %
Lymphocytes Relative: 19 %
Lymphs Abs: 1.4 10*3/uL (ref 0.7–4.0)
MCH: 30.5 pg (ref 26.0–34.0)
MCHC: 31.7 g/dL (ref 30.0–36.0)
MCV: 96 fL (ref 80.0–100.0)
Monocytes Absolute: 0.2 10*3/uL (ref 0.1–1.0)
Monocytes Relative: 3 %
Neutro Abs: 5.5 10*3/uL (ref 1.7–7.7)
Neutrophils Relative %: 76 %
Platelets: 189 10*3/uL (ref 150–400)
RBC: 3.25 MIL/uL — ABNORMAL LOW (ref 3.87–5.11)
RDW: 14.6 % (ref 11.5–15.5)
WBC: 7.3 10*3/uL (ref 4.0–10.5)
nRBC: 0 % (ref 0.0–0.2)

## 2022-02-04 LAB — MRSA NEXT GEN BY PCR, NASAL: MRSA by PCR Next Gen: NOT DETECTED

## 2022-02-04 IMAGING — CT CT HEAD W/O CM
3 of 6 series · 15 of 47 positions shown, 18 images · non-contrast
Comparison: [DATE]

CLINICAL DATA: Acute delirium, history of seizures



[Series 2: head wo · axial · 0.47mm/px · z∈[-135,-0]mm · 10 of 31 slices shown, 13 images]
[im 2/31  brain]
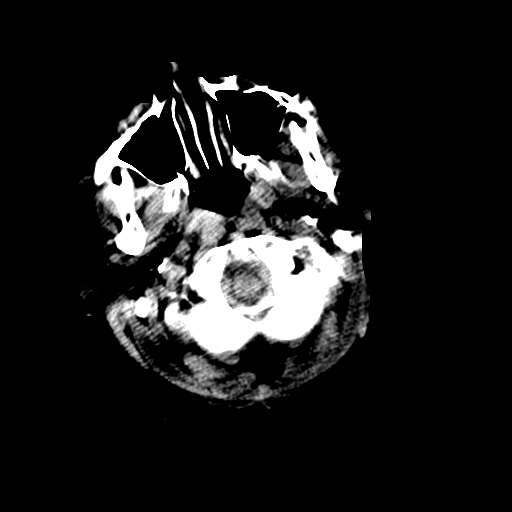
[im 2/31  bone]
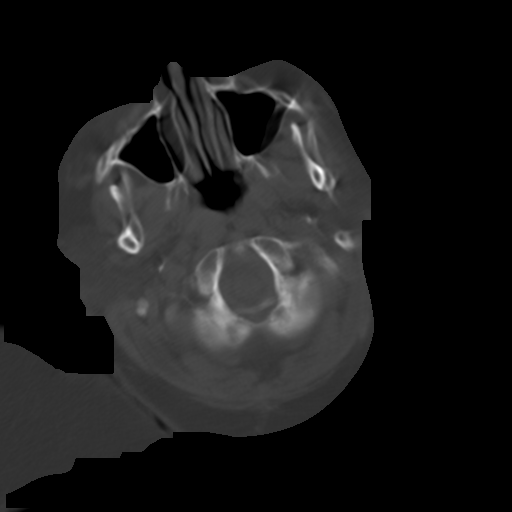
[im 6/31  brain]
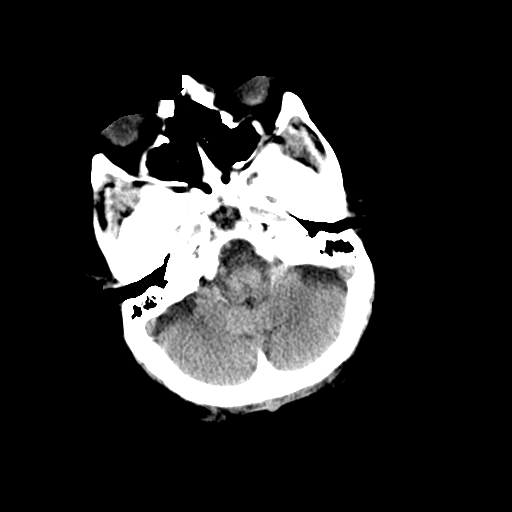
[im 8/31  brain]
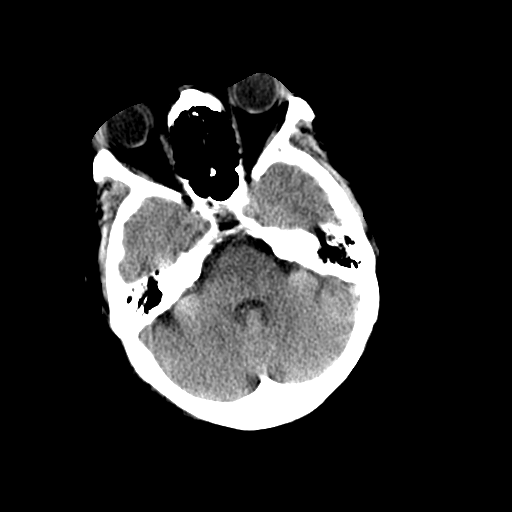
[im 12/31  brain]
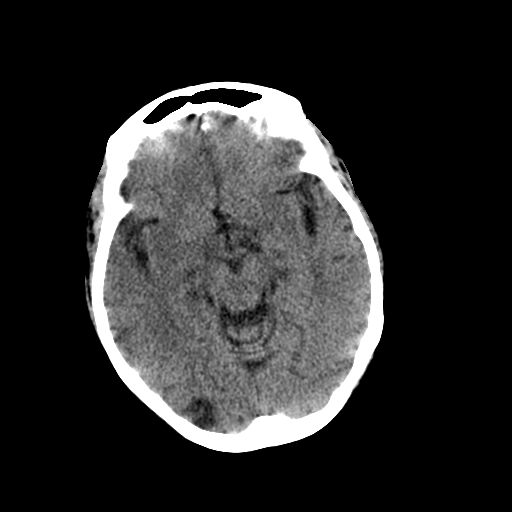
[im 14/31  brain]
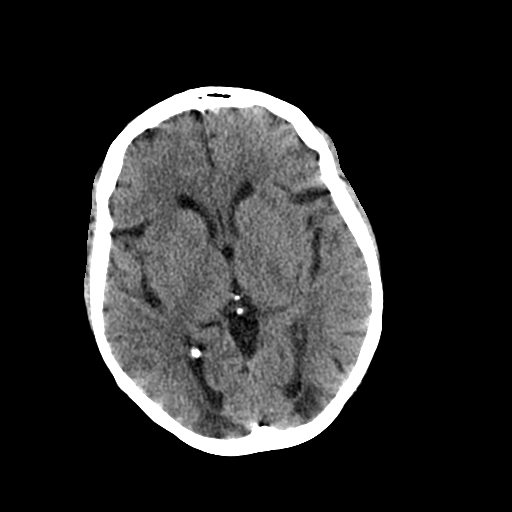
[im 14/31  bone]
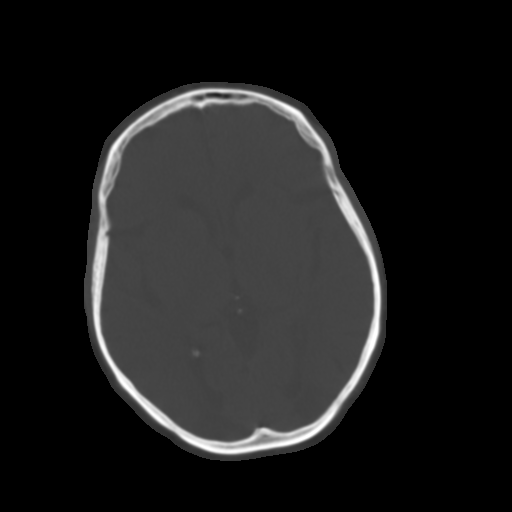
[im 17/31  brain]
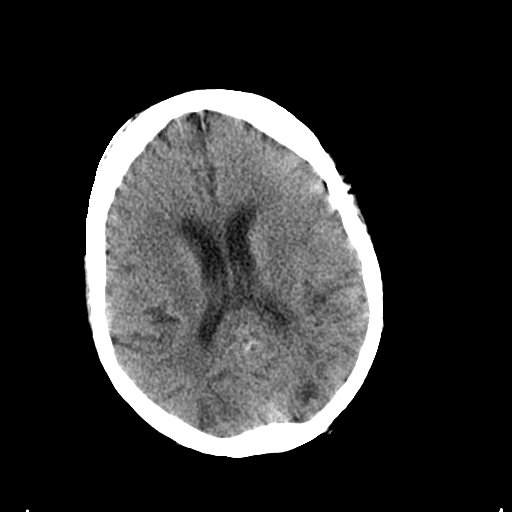
[im 19/31  brain]
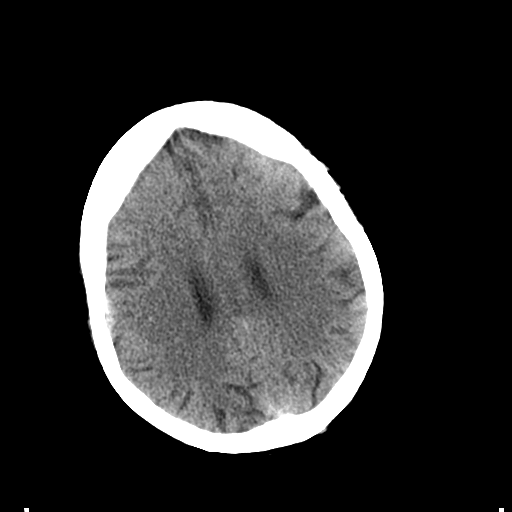
[im 23/31  brain]
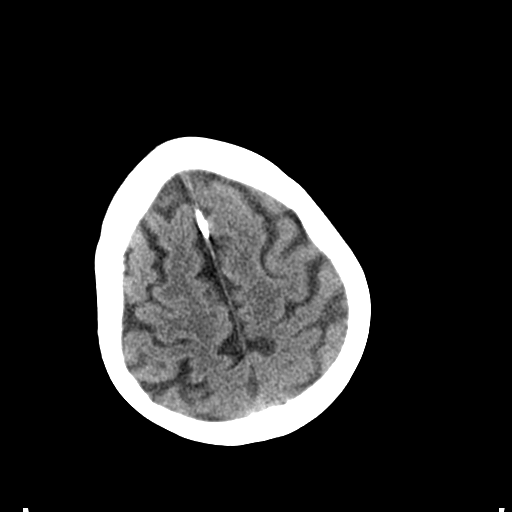
[im 25/31  brain]
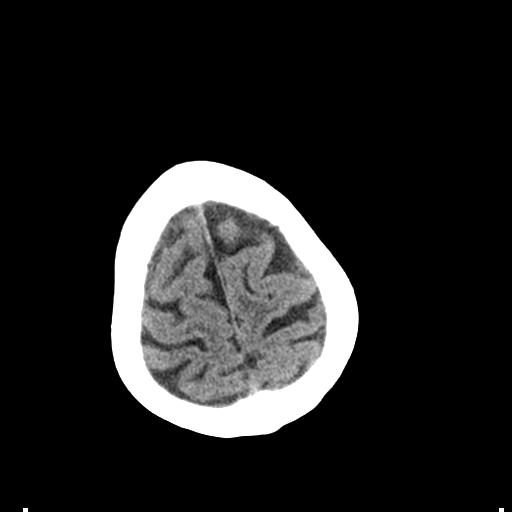
[im 25/31  bone]
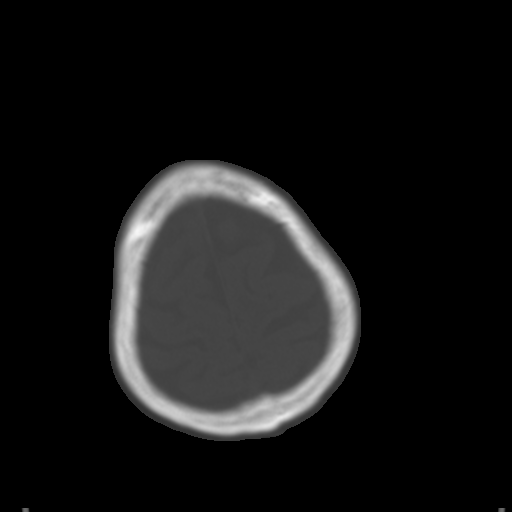
[im 29/31  brain]
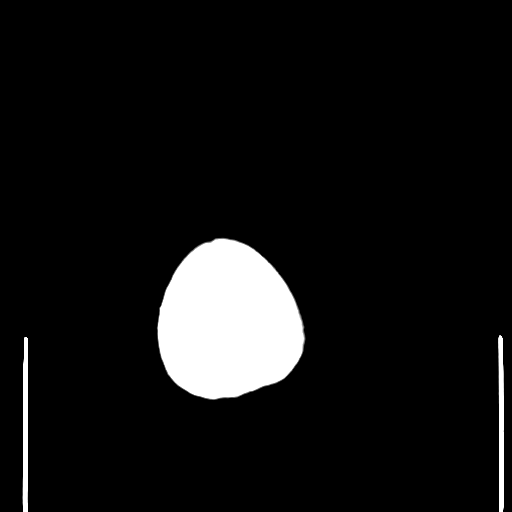

[Series 8: coronal soft tissue · coronal · 0.30mm/px · 3 of 59 slices shown]
[im 15/59  brain]
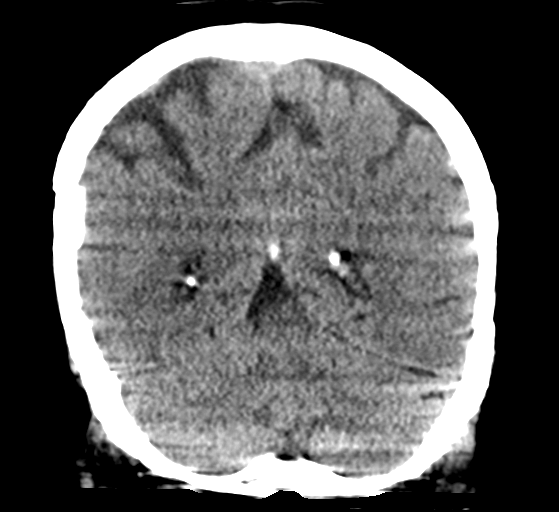
[im 30/59  brain]
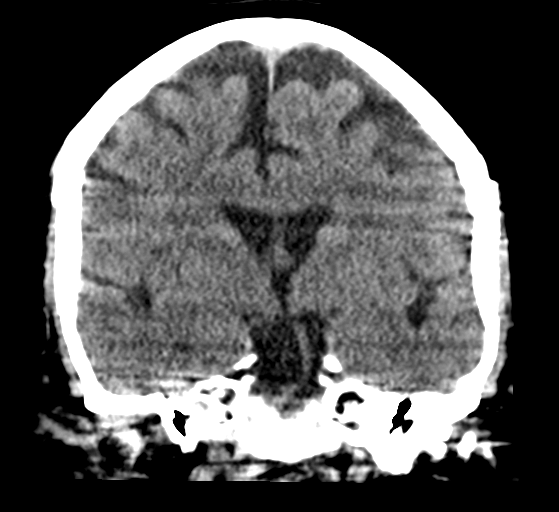
[im 44/59  brain]
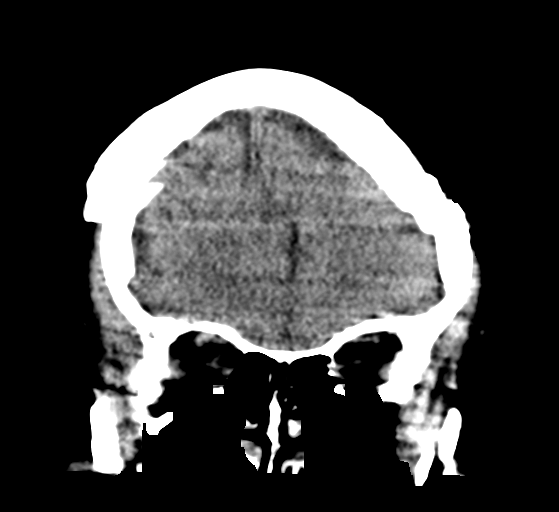

[Series 11: sagittal soft tissue · sagittal · 0.25mm/px · 2 of 49 slices shown]
[im 17/49  brain]
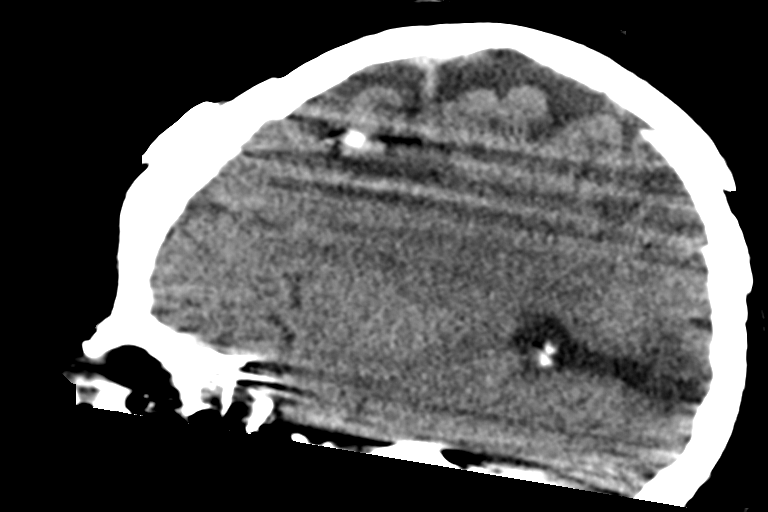
[im 33/49  brain]
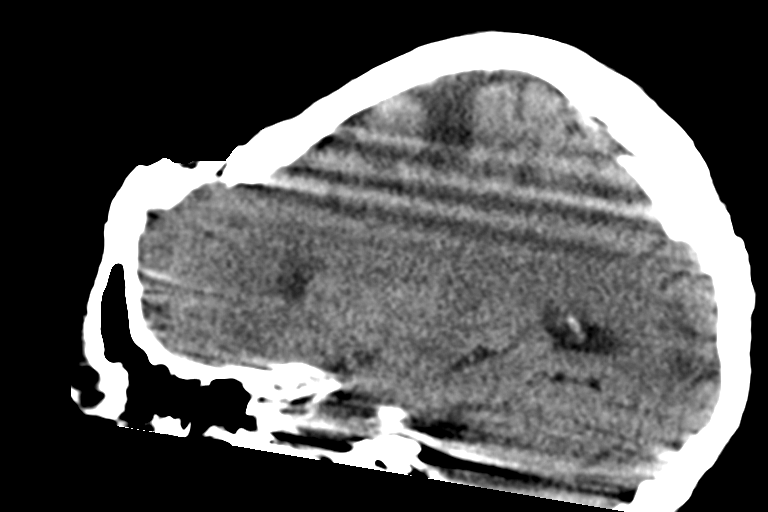

[15 of 47 positions shown; findings below may reference images not displayed]

FINDINGS: Brain: Limited with motion artifact. Similar age related brain
atrophy pattern. Bilateral posterior occipital lobe cortical
hypodensities, which are new since [DATE] compatible with
occipital PCA territory infarcts, subacute to chronic in appearance.

No definite acute intracranial hemorrhage, new mass lesion,
hydrocephalus, herniation, or large extra-axial fluid collection.
Cisterns are patent. No cerebellar abnormality.

Vascular: No hyperdense vessel or unexpected calcification.

Skull: Normal. Negative for fracture or focal lesion.

Sinuses/Orbits: No acute finding.

Other: None.
IMPRESSION: Limited exam with motion artifact.

Interval bilateral posterior occipital lobe hypodensities compatible
with PCA territory infarcts, subacute to chronic in appearance.

Stable age related brain atrophy pattern

No acute intracranial hemorrhage.

## 2022-02-04 MED ORDER — ADULT MULTIVITAMIN W/MINERALS CH
1.0000 | ORAL_TABLET | Freq: Every day | ORAL | Status: DC
  Administered 2022-02-04: 1 via ORAL
  Filled 2022-02-04: qty 1

## 2022-02-04 MED ORDER — DIPHENHYDRAMINE HCL 50 MG/ML IJ SOLN
25.0000 mg | Freq: Once | INTRAMUSCULAR | Status: AC
  Administered 2022-02-04: 25 mg via INTRAVENOUS
  Filled 2022-02-04: qty 1

## 2022-02-04 MED ORDER — SODIUM CHLORIDE 0.9 % IV SOLN: INTRAVENOUS | Status: DC

## 2022-02-04 MED ORDER — ACETAMINOPHEN 650 MG RE SUPP: 650.0000 mg | Freq: Four times a day (QID) | RECTAL | Status: DC | PRN

## 2022-02-04 MED ORDER — DIAZEPAM 5 MG/ML IJ SOLN
2.5000 mg | Freq: Once | INTRAMUSCULAR | Status: AC
Start: 2022-02-04 — End: 2022-02-04
  Administered 2022-02-04: 2.5 mg via INTRAVENOUS
  Filled 2022-02-04: qty 2

## 2022-02-04 MED ORDER — SODIUM CHLORIDE 0.9 % IV BOLUS
1000.0000 mL | Freq: Once | INTRAVENOUS | Status: AC
Start: 2022-02-04 — End: 2022-02-04
  Administered 2022-02-04: 1000 mL via INTRAVENOUS

## 2022-02-04 MED ORDER — FOLIC ACID 1 MG PO TABS
1.0000 mg | ORAL_TABLET | Freq: Every day | ORAL | Status: DC
  Administered 2022-02-04: 1 mg via ORAL
  Filled 2022-02-04: qty 1

## 2022-02-04 MED ORDER — THIAMINE HCL 100 MG/ML IJ SOLN: 100.0000 mg | Freq: Every day | INTRAMUSCULAR | Status: DC

## 2022-02-04 MED ORDER — ONDANSETRON HCL 4 MG PO TABS: 4.0000 mg | ORAL_TABLET | Freq: Four times a day (QID) | ORAL | Status: DC | PRN

## 2022-02-04 MED ORDER — THIAMINE HCL 100 MG PO TABS
100.0000 mg | ORAL_TABLET | Freq: Every day | ORAL | Status: DC
  Administered 2022-02-05 – 2022-02-07 (×3): 100 mg via ORAL
  Filled 2022-02-04 (×3): qty 1

## 2022-02-04 MED ORDER — LORAZEPAM 1 MG PO TABS
0.0000 mg | ORAL_TABLET | Freq: Three times a day (TID) | ORAL | Status: DC
  Filled 2022-02-04: qty 1

## 2022-02-04 MED ORDER — ONDANSETRON HCL 4 MG/2ML IJ SOLN: 4.0000 mg | Freq: Four times a day (QID) | INTRAMUSCULAR | Status: DC | PRN

## 2022-02-04 MED ORDER — LORAZEPAM 1 MG PO TABS: 1.0000 mg | ORAL_TABLET | ORAL | Status: DC | PRN

## 2022-02-04 MED ORDER — LORAZEPAM 1 MG PO TABS
1.0000 mg | ORAL_TABLET | ORAL | Status: DC | PRN
  Administered 2022-02-04: 3 mg via ORAL
  Filled 2022-02-04: qty 3

## 2022-02-04 MED ORDER — LORAZEPAM 2 MG/ML IJ SOLN: 1.0000 mg | INTRAMUSCULAR | Status: DC | PRN

## 2022-02-04 MED ORDER — ENOXAPARIN SODIUM 80 MG/0.8ML IJ SOSY
70.0000 mg | PREFILLED_SYRINGE | INTRAMUSCULAR | Status: DC
  Administered 2022-02-04 – 2022-02-06 (×3): 70 mg via SUBCUTANEOUS
  Filled 2022-02-04 (×3): qty 0.7

## 2022-02-04 MED ORDER — CARVEDILOL 12.5 MG PO TABS
12.5000 mg | ORAL_TABLET | Freq: Two times a day (BID) | ORAL | Status: DC
  Administered 2022-02-05 – 2022-02-06 (×4): 12.5 mg via ORAL
  Filled 2022-02-04 (×4): qty 1

## 2022-02-04 MED ORDER — CHLORHEXIDINE GLUCONATE CLOTH 2 % EX PADS
6.0000 | MEDICATED_PAD | Freq: Every day | CUTANEOUS | Status: DC
  Administered 2022-02-04 – 2022-02-07 (×3): 6 via TOPICAL

## 2022-02-04 MED ORDER — ADULT MULTIVITAMIN W/MINERALS CH
1.0000 | ORAL_TABLET | Freq: Every day | ORAL | Status: DC
  Administered 2022-02-05 – 2022-02-07 (×3): 1 via ORAL
  Filled 2022-02-04 (×3): qty 1

## 2022-02-04 MED ORDER — PROCHLORPERAZINE EDISYLATE 10 MG/2ML IJ SOLN
10.0000 mg | Freq: Once | INTRAMUSCULAR | Status: AC
  Administered 2022-02-04: 10 mg via INTRAVENOUS
  Filled 2022-02-04: qty 2

## 2022-02-04 MED ORDER — CHLORDIAZEPOXIDE HCL 25 MG PO CAPS
100.0000 mg | ORAL_CAPSULE | Freq: Once | ORAL | Status: AC
Start: 2022-02-04 — End: 2022-02-04
  Administered 2022-02-04: 100 mg via ORAL
  Filled 2022-02-04: qty 4

## 2022-02-04 MED ORDER — THIAMINE HCL 100 MG PO TABS
100.0000 mg | ORAL_TABLET | Freq: Every day | ORAL | Status: DC
  Administered 2022-02-04: 100 mg via ORAL
  Filled 2022-02-04: qty 1

## 2022-02-04 MED ORDER — ORAL CARE MOUTH RINSE
15.0000 mL | Freq: Two times a day (BID) | OROMUCOSAL | Status: DC
  Administered 2022-02-04 – 2022-02-07 (×6): 15 mL via OROMUCOSAL

## 2022-02-04 MED ORDER — ACETAMINOPHEN 325 MG PO TABS
650.0000 mg | ORAL_TABLET | Freq: Four times a day (QID) | ORAL | Status: DC | PRN
  Administered 2022-02-05: 650 mg via ORAL
  Filled 2022-02-04: qty 2

## 2022-02-04 MED ORDER — LORAZEPAM 1 MG PO TABS
0.0000 mg | ORAL_TABLET | ORAL | Status: AC
  Administered 2022-02-05: 1 mg via ORAL
  Administered 2022-02-05 (×2): 2 mg via ORAL
  Administered 2022-02-05 – 2022-02-06 (×3): 1 mg via ORAL
  Filled 2022-02-04 (×5): qty 1
  Filled 2022-02-04: qty 2

## 2022-02-04 MED ORDER — FOLIC ACID 1 MG PO TABS
1.0000 mg | ORAL_TABLET | Freq: Every day | ORAL | Status: DC
  Administered 2022-02-05 – 2022-02-07 (×3): 1 mg via ORAL
  Filled 2022-02-04 (×3): qty 1

## 2022-02-04 NOTE — ED Triage Notes (Addendum)
Pt BIB EMS from home. Patient has a hx of seizures but does not take medications for seizures. Patient recently stopped drinking 2 days ago. EMS gave '5mg'$  of Midazolam IM. Hx of hypertension and alcohol abuse.

## 2022-02-04 NOTE — ED Provider Notes (Signed)
[facility] DEPT Provider Note   CSN: [id number] Arrival date & time: 02/04/22  1251     History  No chief complaint on file.   [name] is a 67 y.o. female.  66 yo F with a chief complaints of seizure-like activity.  Had 2 reported seizures at home prior to calling EMS.  Was given a dose of Versed intramuscularly by them and no noted seizure activity at least per report.  The patient has a history of alcoholism and had recently quit drinking about 48 hours ago again per EMS.  The patient is unable to give me any further history.        Home Medications Prior to Admission medications   Medication Sig Start Date End Date Taking? Authorizing Provider  albuterol (VENTOLIN HFA) 108 (90 Base) MCG/ACT inhaler Inhale 2 puffs into the lungs every 6 (six) hours as needed for wheezing or shortness of breath. 09/12/21 09/12/22  [name], Corrie Mckusick, MD  amLODipine (NORVASC) 10 MG tablet Take 1 tablet (10 mg total) by mouth daily. 09/12/21   [name], Corrie Mckusick, MD  anastrozole (ARIMIDEX) 1 MG tablet Take 1 tablet (1 mg total) by mouth daily. 12/18/20   Magrinat, Virgie Dad, MD  Biotin 10000 MCG TABS Take 10,000 mcg by mouth daily.    [provider]  CARESTART COVID-19 HOME TEST KIT See admin instructions. 11/06/21   [provider]  carvedilol (COREG) 12.5 MG tablet TAKE 1 TABLET BY MOUTH TWICE DAILY WITH MEALS 04/08/21   Ladell Pier, MD  cloNIDine (CATAPRES - DOSED IN MG/24 HR) 0.2 mg/24hr patch Place 1 patch (0.2 mg total) onto the skin once a week. 09/12/21   [name], Corrie Mckusick, MD  Ensure Max Protein (ENSURE MAX PROTEIN) LIQD Take 330 mLs (11 oz total) by mouth 2 (two) times daily. 09/12/21   [name], Corrie Mckusick, MD  folic acid (FOLVITE) 1 MG tablet Take 1 tablet (1 mg total) by mouth daily. 08/03/20   [name], MD  furosemide (LASIX) 20 MG tablet Take 1 tablet (20 mg total) by mouth daily. Please make overdue appt with Dr. Gasper Sells before  anymore refills. Thank you 1st attempt Patient taking differently: Take 20 mg by mouth daily. 06/03/21   Chandrasekhar, Lyda Kalata A, MD  gabapentin (NEURONTIN) 300 MG capsule Take 1 capsule (300 mg total) by mouth at bedtime. 07/14/20   Magrinat, Virgie Dad, MD  lidocaine (XYLOCAINE) 5 % ointment Apply 1 application topically as needed. 06/10/21   Sherrell Puller, PA-C  Magnesium Oxide 400 MG CAPS Take 1 capsule (400 mg total) by mouth daily. 06/30/20   [name], MD  MAGOX 400 400 (240 Mg) MG tablet  10/27/21   [provider]  Multiple Vitamin (MULTIVITAMIN WITH MINERALS) TABS tablet Take 1 tablet by mouth daily. 09/12/21   [name], Corrie Mckusick, MD  nystatin (MYCOSTATIN/NYSTOP) powder Apply topically 3 (three) times daily. Apply to arms pits, fat folds, inter-trigone 09/12/21   [name], [name], MD  oxyCODONE-acetaminophen (PERCOCET/ROXICET) 5-325 MG tablet Take 1-2 tablets by mouth every 6 (six) hours as needed for severe pain. 12/01/21   [name] Leigh, MD  predniSONE (DELTASONE) 5 MG tablet Take by mouth. 12/09/21   [provider]  predniSONE (STERAPRED UNI-PAK 21 TAB) 5 MG (21) TBPK tablet Take by mouth as directed. 11/30/21   [provider]  thiamine 100 MG tablet Take 1 tablet (100 mg total) by mouth daily. 09/12/21   [name], Corrie Mckusick, MD  Vitamin D, Ergocalciferol, (DRISDOL) 1.25 MG (50000  UNIT) CAPS capsule Take 50,000 Units by mouth once a week. 10/30/21   [provider]      Allergies    Amlodipine    Review of Systems   Review of Systems  Physical Exam Updated Vital Signs BP (!) 147/83 (BP Location: Left Arm)   Pulse (!) 115   Temp 98.3 F (36.8 C) (Axillary)   Resp (!) 23   Ht _0  (1.549 m)   Wt 135.1 kg   SpO2 98%   BMI 56.27 kg/m  Physical Exam Vitals and nursing note reviewed.  Constitutional:      General: She is not in acute distress.    Appearance: She is well-developed. She is not diaphoretic.     Comments: BMI 56  HENT:      Head: Normocephalic and atraumatic.  Eyes:     Pupils: Pupils are equal, round, and reactive to light.  Cardiovascular:     Rate and Rhythm: Normal rate and regular rhythm.     Heart sounds: No murmur heard.    No friction rub. No gallop.  Pulmonary:     Effort: Pulmonary effort is normal.     Breath sounds: No wheezing or rales.  Abdominal:     General: There is no distension.     Palpations: Abdomen is soft.     Tenderness: There is no abdominal tenderness.  Musculoskeletal:        General: No tenderness.     Cervical back: Normal range of motion and neck supple.  Skin:    General: Skin is warm and dry.  Neurological:     Mental Status: She is alert.     Comments: Patient keeps her eyes forcibly closed and answers some questions.  Psychiatric:        Behavior: Behavior normal.     ED Results / Procedures / Treatments   Labs (all labs ordered are listed, but only abnormal results are displayed) Labs Reviewed  CBC WITH DIFFERENTIAL/PLATELET  COMPREHENSIVE METABOLIC PANEL  PROTIME-INR    EKG None  Radiology No results found.  Procedures Procedures    Medications Ordered in ED Medications  sodium chloride 0.9 % bolus 1,000 mL (has no administration in time range)  diazepam (VALIUM) injection 2.5 mg (has no administration in time range)  chlordiazePOXIDE (LIBRIUM) capsule 100 mg (has no administration in time range)    ED Course/ Medical Decision Making/ A&P                           Medical Decision Making Amount and/or Complexity of Data Reviewed Labs: ordered. Radiology: ordered.  Risk OTC drugs. Prescription drug management. Decision regarding hospitalization.   66 yo F with a chief complaints of seizure-like activity.  This occurred earlier today.  She has a history of seizures with alcohol withdrawal.  By EMS report this is the same.  The patient is unable to provide any significant history for me either postictal or medication effects with Versed  given in route.  We will obtain a laboratory evaluation and CT scan of the head reassess.  CT head without obvious acute abnormality.  No significant anemia, INR normal, metabolic acidosis, ? Lactic acidosis from seizure.   Patient reassessed multiple times with minimal improvement, opens eyes will grunt, not able or willing to follow commands.  Did tell nursing she was having a headache.   On record review most recent visit to the ED for this had progressive  worsening mental status and required intubation, not requiring intubation currently will discuss with ICU. Will come to see.   CRITICAL CARE Performed by: Cecilio Asper   Total critical care time: 35 minutes  Critical care time was exclusive of separately billable procedures and treating other patients.  Critical care was necessary to treat or prevent imminent or life-threatening deterioration.  Critical care was time spent personally by me on the following activities: development of treatment plan with patient and/or surrogate as well as nursing, discussions with consultants, evaluation of patient's response to treatment, examination of patient, obtaining history from patient or surrogate, ordering and performing treatments and interventions, ordering and review of laboratory studies, ordering and review of radiographic studies, pulse oximetry and re-evaluation of patient's condition.  The patients results and plan were reviewed and discussed.   Any x-rays performed were independently reviewed by myself.   Differential diagnosis were considered with the presenting HPI.  Medications  LORazepam (ATIVAN) tablet 1-4 mg (has no administration in time range)    Or  LORazepam (ATIVAN) injection 1-4 mg (has no administration in time range)  thiamine tablet 100 mg (has no administration in time range)    Or  thiamine (B-1) injection 100 mg (has no administration in time range)  folic acid (FOLVITE) tablet 1 mg (has no  administration in time range)  multivitamin with minerals tablet 1 tablet (has no administration in time range)  sodium chloride 0.9 % bolus 1,000 mL (1,000 mLs Intravenous New Bag/Given 02/04/22 1324)  diazepam (VALIUM) injection 2.5 mg (2.5 mg Intravenous Given 02/04/22 1326)  chlordiazePOXIDE (LIBRIUM) capsule 100 mg (100 mg Oral Given 02/04/22 1325)  prochlorperazine (COMPAZINE) injection 10 mg (10 mg Intravenous Given 02/04/22 1440)  diphenhydrAMINE (BENADRYL) injection 25 mg (25 mg Intravenous Given 02/04/22 1439)    Vitals:   02/04/22 1400 02/04/22 1415 02/04/22 1500 02/04/22 1515  BP: (!) 166/146 (!) 173/160 (!) 167/91 (!) 160/81  Pulse: (!) 116 (!) 115 (!) 111 (!) 117  Resp: (!) 24 (!) 21 20 (!) 22  Temp:      TempSrc:      SpO2: 97% 98% 96% 97%  Weight:      Height:        Final diagnoses:  Alcohol withdrawal seizure with delirium ([place])    Admission/ observation were discussed with the admitting physician, patient and/or family and they are comfortable with the plan.         Final Clinical Impression(s) / ED Diagnoses Final diagnoses:  None    Rx / DC Orders ED Discharge Orders     None         Deno Etienne, DO 02/04/22 1530

## 2022-02-04 NOTE — ED Provider Notes (Signed)
Care of the patient assumed at signout. 4:31 PM Patient resting.  Vitals 120 heart rate, blood pressure 170/80.  Case discussed with our hospitalist colleagues for admission.  I previously spoke with the nurse practitioner from critical care, who notes that the patient is appropriate for hospitalist admission.   Carmin Muskrat, MD 02/04/22 [phone]

## 2022-02-04 NOTE — H&P (Signed)
History and Physical    [name]:[id number] DOB: [birth year] DOA: 02/04/2022  I have briefly reviewed the patient's prior medical records in [place]  PCP: [name], MD  Patient coming from: home  Chief Complaint: seizure witnessed by family  HPI: [name] is a 66 y.o. female with medical history significant of alcohol abuse with withdrawal seizures, left breast cancer status postmastectomy and radiation, morbid obesity, hypertension, alcoholic liver disease, CKD 3B, chronic diastolic CHF who comes to the hospital after witnessed seizure by the family.  She received Versed per EMS and she was brought to the hospital.  On my evaluation, she is starting to wake up but still confused and lethargic.  She tells me that her daughter thinks she had a seizure but patient is convinced she did not.  She has no other recollection of earlier events today.  She does tell me that last time she drank alcohol was yesterday, she had liquor.  She has had prior hospitalization with withdrawal seizures.  She denies any chest pains, no nausea or vomiting, no fever or chills but again history is not entirely reliable  ED Course: In the emergency room she is afebrile, tachycardic into the 1 teens, blood pressure is on the high side in the 160s.  She is satting well on room air.  Blood work reveals a bicarb of 17, creatinine 1.8, white count is normal, hemoglobin is 9.9.  CT head showed brain atrophy but no acute findings, and a chest x-ray showed cardiomegaly without infiltrates.  We are asked to admit for alcohol withdrawal seizures  Review of Systems: Unable to obtain full review of systems due to confusion  Past Medical History:  Diagnosis Date   Alcohol dependence ([place])    Aortic atherosclerosis ([place])    Arthritis    Cancer ([place]) 11/2019   Left breast   Chronic heart failure (HCC)    Chronic kidney disease, stage 3 ([place])    COVID-19    Diverticulitis    Family history of  ovarian cancer    Heart murmur    11/26/19 echo: Mild AS. AV mean gradient 12.0 mmHg; however, LVOT gradient 8 mmHg with intracvitary gradient-significant AS is not suspected   Hypertension    Right nephrolithiasis    Thoracic aortic aneurysm (HCC)    Tubular adenoma polyp of rectum     Past Surgical History:  Procedure Laterality Date   COLONOSCOPY WITH PROPOFOL N/A 02/24/2021   Procedure: COLONOSCOPY WITH PROPOFOL;  Surgeon: Yetta Flock, MD;  Location: [facility];  Service: Gastroenterology;  Laterality: N/A;   MASTECTOMY WITH AXILLARY LYMPH NODE DISSECTION Left 12/19/2019   Procedure: LEFT MASTECTOMY WITH AXILLARY LYMPH NODE DISSECTION;  Surgeon: [name], MD;  Location: [facility];  Service: General;  Laterality: Left;   MULTIPLE EXTRACTIONS WITH ALVEOLOPLASTY Bilateral 12/14/2019   Procedure: MULTIPLE EXTRACTION WITH ALVEOLOPLASTY;  Surgeon: [name], DDS;  Location: [facility];  Service: Oral Surgery;  Laterality: Bilateral;   POLYPECTOMY  02/24/2021   Procedure: POLYPECTOMY;  Surgeon: Yetta Flock, MD;  Location: [facility];  Service: Gastroenterology;;   Sol Passer PLACEMENT N/A 12/19/2019   Procedure: INSERTION PORT-A-CATH WITH ULTRASOUND GUIDANCE;  Surgeon: [name], MD;  Location: [facility];  Service: General;  Laterality: N/A;   RADIAL HEAD ARTHROPLASTY Right 06/25/2019   Procedure: RIGHT RADIAL HEAD ARTHROPLASTY;  Surgeon: [name], MD;  Location: [facility];  Service: Orthopedics;  Laterality: Right;   TUBAL LIGATION  reports that she has never smoked. She has never used smokeless tobacco. She reports current alcohol use of about 3.0 standard drinks of alcohol per week. She reports current drug use. Drug: Marijuana.  Allergies  Allergen Reactions   Amlodipine Swelling    BLE edema    Family History  Problem Relation Age of Onset   Hypertension Mother    Dementia Mother    Ovarian cancer Half-Sister 71   Diabetes Half-Sister    Stroke  Half-Sister     Prior to Admission medications   Medication Sig Start Date End Date Taking? Authorizing Provider  albuterol (VENTOLIN HFA) 108 (90 Base) MCG/ACT inhaler Inhale 2 puffs into the lungs every 6 (six) hours as needed for wheezing or shortness of breath. 09/12/21 09/12/22  [name], Corrie Mckusick, MD  amLODipine (NORVASC) 10 MG tablet Take 1 tablet (10 mg total) by mouth daily. 09/12/21   [name], Corrie Mckusick, MD  anastrozole (ARIMIDEX) 1 MG tablet Take 1 tablet (1 mg total) by mouth daily. 12/18/20   Magrinat, Virgie Dad, MD  Biotin 10000 MCG TABS Take 10,000 mcg by mouth daily.    [provider]  CARESTART COVID-19 HOME TEST KIT See admin instructions. 11/06/21   [provider]  carvedilol (COREG) 12.5 MG tablet TAKE 1 TABLET BY MOUTH TWICE DAILY WITH MEALS 04/08/21   Ladell Pier, MD  cloNIDine (CATAPRES - DOSED IN MG/24 HR) 0.2 mg/24hr patch Place 1 patch (0.2 mg total) onto the skin once a week. 09/12/21   [name], Corrie Mckusick, MD  Ensure Max Protein (ENSURE MAX PROTEIN) LIQD Take 330 mLs (11 oz total) by mouth 2 (two) times daily. 09/12/21   [name], Corrie Mckusick, MD  folic acid (FOLVITE) 1 MG tablet Take 1 tablet (1 mg total) by mouth daily. 08/03/20   [name], MD  furosemide (LASIX) 20 MG tablet Take 1 tablet (20 mg total) by mouth daily. Please make overdue appt with Dr. Gasper Sells before anymore refills. Thank you 1st attempt Patient taking differently: Take 20 mg by mouth daily. 06/03/21   Chandrasekhar, Lyda Kalata A, MD  gabapentin (NEURONTIN) 300 MG capsule Take 1 capsule (300 mg total) by mouth at bedtime. 07/14/20   Magrinat, Virgie Dad, MD  lidocaine (XYLOCAINE) 5 % ointment Apply 1 application topically as needed. 06/10/21   Sherrell Puller, PA-C  Magnesium Oxide 400 MG CAPS Take 1 capsule (400 mg total) by mouth daily. 06/30/20   [name], MD  MAGOX 400 400 (240 Mg) MG tablet  10/27/21   [provider]  Multiple Vitamin (MULTIVITAMIN WITH MINERALS)  TABS tablet Take 1 tablet by mouth daily. 09/12/21   [name], Corrie Mckusick, MD  nystatin (MYCOSTATIN/NYSTOP) powder Apply topically 3 (three) times daily. Apply to arms pits, fat folds, inter-trigone 09/12/21   [name], [name], MD  oxyCODONE-acetaminophen (PERCOCET/ROXICET) 5-325 MG tablet Take 1-2 tablets by mouth every 6 (six) hours as needed for severe pain. 12/01/21   [name] Leigh, MD  predniSONE (DELTASONE) 5 MG tablet Take by mouth. 12/09/21   [provider]  predniSONE (STERAPRED UNI-PAK 21 TAB) 5 MG (21) TBPK tablet Take by mouth as directed. 11/30/21   [provider]  thiamine 100 MG tablet Take 1 tablet (100 mg total) by mouth daily. 09/12/21   [name], Corrie Mckusick, MD  Vitamin D, Ergocalciferol, (DRISDOL) 1.25 MG (50000 UNIT) CAPS capsule Take 50,000 Units by mouth once a week. 10/30/21   [provider]    Physical Exam: Vitals:   02/04/22 1532 02/04/22 1545 02/04/22 1600  02/04/22 1615  BP: (!) 160/81 (!) 162/82 (!) 164/99 (!) 167/84  Pulse: (!) 128 (!) 117 (!) 126 (!) 118  Resp:  (!) 24 (!) 29 (!) 28  Temp:      TempSrc:      SpO2:  97% 99% 98%  Weight:      Height:        Constitutional: Lethargic Eyes: PERRL, lids and conjunctivae normal ENMT: Mucous membranes are dry Neck: normal, supple Respiratory: clear to auscultation bilaterally, no wheezing, no crackles. Normal respiratory effort. No accessory muscle use.  Cardiovascular: Regular rate and rhythm, no murmurs / rubs / gallops.  Trace edema Abdomen: no tenderness, no masses palpated. Bowel sounds positive.  Musculoskeletal: no clubbing / cyanosis. Normal muscle tone.  Skin: no rashes, lesions, ulcers. No induration Neurologic: Does not follow commands consistently but no focal deficits  Labs on Admission: I have personally reviewed following labs and imaging studies  CBC: Recent Labs  Lab 02/04/22 1344  WBC 7.3  NEUTROABS 5.5  HGB 9.9*  HCT 31.2*  MCV 96.0  PLT 785   Basic Metabolic  Panel: Recent Labs  Lab 02/04/22 1344  NA 144  K 3.5  CL 112*  CO2 17*  GLUCOSE 73  BUN 33*  CREATININE 1.78*  CALCIUM 8.6*   Liver Function Tests: Recent Labs  Lab 02/04/22 1344  AST 20  ALT 11  ALKPHOS 47  BILITOT 0.6  PROT 7.9  ALBUMIN 3.7   Coagulation Profile: Recent Labs  Lab 02/04/22 1403  INR 1.2   BNP (last 3 results) No results for input(s): "PROBNP" in the last 8760 hours. CBG: No results for input(s): "GLUCAP" in the last 168 hours. Thyroid Function Tests: No results for input(s): "TSH", "T4TOTAL", "FREET4", "T3FREE", "THYROIDAB" in the last 72 hours. Urine analysis:    Component Value Date/Time   COLORURINE YELLOW 08/31/2021 0044   APPEARANCEUR HAZY (A) 08/31/2021 0044   LABSPEC 1.009 08/31/2021 0044   PHURINE 6.0 08/31/2021 0044   GLUCOSEU NEGATIVE 08/31/2021 0044   HGBUR SMALL (A) 08/31/2021 0044   BILIRUBINUR NEGATIVE 08/31/2021 0044   KETONESUR NEGATIVE 08/31/2021 0044   PROTEINUR 100 (A) 08/31/2021 0044   UROBILINOGEN 1.0 07/14/2012 1630   NITRITE POSITIVE (A) 08/31/2021 0044   LEUKOCYTESUR MODERATE (A) 08/31/2021 0044     Radiological Exams on Admission: CT Head Wo Contrast  Result Date: 02/04/2022 CLINICAL DATA:  Acute delirium, history of seizures EXAM: CT HEAD WITHOUT CONTRAST TECHNIQUE: Contiguous axial images were obtained from the base of the skull through the vertex without intravenous contrast. RADIATION DOSE REDUCTION: This exam was performed according to the departmental dose-optimization program which includes automated exposure control, adjustment of the mA and/or kV according to patient size and/or use of iterative reconstruction technique. COMPARISON:  08/31/2021 FINDINGS: Brain: Limited with motion artifact. Similar age related brain atrophy pattern. Bilateral posterior occipital lobe cortical hypodensities, which are new since 08/31/2021 compatible with occipital PCA territory infarcts, subacute to chronic in appearance. No  definite acute intracranial hemorrhage, new mass lesion, hydrocephalus, herniation, or large extra-axial fluid collection. Cisterns are patent. No cerebellar abnormality. Vascular: No hyperdense vessel or unexpected calcification. Skull: Normal. Negative for fracture or focal lesion. Sinuses/Orbits: No acute finding. Other: None. IMPRESSION: Limited exam with motion artifact. Interval bilateral posterior occipital lobe hypodensities compatible with PCA territory infarcts, subacute to chronic in appearance. Stable age related brain atrophy pattern No acute intracranial hemorrhage. Electronically Signed   By: Jerilynn Mages.  Shick M.D.   On: 02/04/2022 14:55  DG Chest Port 1 View  Result Date: 02/04/2022 CLINICAL DATA:  Altered mental status, history of seizures EXAM: PORTABLE CHEST 1 VIEW COMPARISON:  09/04/2021 FINDINGS: Transverse diameter of heart is increased. There are no signs of pulmonary edema or focal pulmonary consolidation. There is no pleural effusion or pneumothorax. Tip of right chest port is seen in the superior vena cava close to the right atrium. Surgical clips are seen in the left axilla and left chest wall. IMPRESSION: Cardiomegaly. There are no signs of pulmonary edema or focal pulmonary consolidation. Electronically Signed   By: Elmer Picker M.D.   On: 02/04/2022 13:45    EKG: Independently reviewed.  Previous sinus tach  Assessment/Plan Possible problem Alcohol abuse, alcohol withdrawal seizures-admit patient to the hospital, she is at high risk for DTs.  Placed on CIWA stepdown protocol with scheduled Ativan.  Closely monitor.  She is starting to wake up.  When more awake, will need to discuss her ongoing EtOH problem  Active problems Chronic kidney disease stage IIIb-creatinine earlier this year has been ranging between as low as 1.17 but that was a one-time value, most recently a month ago at 1.8.  And 2.0.  Currently at 1.7, suspect close to her baseline.  Gentle fluids  overnight  Metabolic acidosis-due to seizure, repeat labs in the morning.  Fluids overnight  History of breast cancer-awaiting med rec to see if she is on Arimidex  Essential hypertension-awaiting med rec, however do place her on Coreg given tachycardia and hypertension.  Morbid obesity-BMI 56.2, she would benefit from weight loss  Also need to determine whether she is taking prednisone  Chronic diastolic CHF-no significant volume overload.  Echo in April 2022 showed grade 2 diastolic dysfunction  Chronic alcoholism with relapse-TOC consult tomorrow  Liver cirrhosis-seen on prior imaging in 2021.  Surprisingly LFTs are normal despite recent EtOH intake  Chronic anemia-because of EtOH, chronic disease  DVT prophylaxis: Lovenox  Code Status: Full code  Family Communication: no fmaily at bedside  Disposition Plan: TBD Bed Type: SDU Consults called: none  Obs/Inp: Obs  Marzetta Board, MD, PhD [facility]  Contact via [email or web address]  02/04/2022, 4:38 PM

## 2022-02-05 ENCOUNTER — Inpatient Hospital Stay (HOSPITAL_COMMUNITY): Payer: Medicare Other

## 2022-02-05 DIAGNOSIS — I712 Thoracic aortic aneurysm, without rupture, unspecified: Secondary | ICD-10-CM | POA: Diagnosis present

## 2022-02-05 DIAGNOSIS — E876 Hypokalemia: Secondary | ICD-10-CM | POA: Diagnosis not present

## 2022-02-05 DIAGNOSIS — Z823 Family history of stroke: Secondary | ICD-10-CM | POA: Diagnosis not present

## 2022-02-05 DIAGNOSIS — G319 Degenerative disease of nervous system, unspecified: Secondary | ICD-10-CM | POA: Diagnosis present

## 2022-02-05 DIAGNOSIS — Z9012 Acquired absence of left breast and nipple: Secondary | ICD-10-CM | POA: Diagnosis not present

## 2022-02-05 DIAGNOSIS — G9341 Metabolic encephalopathy: Secondary | ICD-10-CM | POA: Diagnosis present

## 2022-02-05 DIAGNOSIS — Z6841 Body Mass Index (BMI) 40.0 and over, adult: Secondary | ICD-10-CM | POA: Diagnosis not present

## 2022-02-05 DIAGNOSIS — Z8041 Family history of malignant neoplasm of ovary: Secondary | ICD-10-CM | POA: Diagnosis not present

## 2022-02-05 DIAGNOSIS — F10131 Alcohol abuse with withdrawal delirium: Secondary | ICD-10-CM | POA: Diagnosis present

## 2022-02-05 DIAGNOSIS — R569 Unspecified convulsions: Secondary | ICD-10-CM | POA: Diagnosis not present

## 2022-02-05 DIAGNOSIS — I5032 Chronic diastolic (congestive) heart failure: Secondary | ICD-10-CM | POA: Diagnosis present

## 2022-02-05 DIAGNOSIS — Z8249 Family history of ischemic heart disease and other diseases of the circulatory system: Secondary | ICD-10-CM | POA: Diagnosis not present

## 2022-02-05 DIAGNOSIS — D638 Anemia in other chronic diseases classified elsewhere: Secondary | ICD-10-CM | POA: Diagnosis present

## 2022-02-05 DIAGNOSIS — G4089 Other seizures: Secondary | ICD-10-CM | POA: Diagnosis present

## 2022-02-05 DIAGNOSIS — Z853 Personal history of malignant neoplasm of breast: Secondary | ICD-10-CM | POA: Diagnosis not present

## 2022-02-05 DIAGNOSIS — F10939 Alcohol use, unspecified with withdrawal, unspecified: Secondary | ICD-10-CM | POA: Diagnosis present

## 2022-02-05 DIAGNOSIS — Z923 Personal history of irradiation: Secondary | ICD-10-CM | POA: Diagnosis not present

## 2022-02-05 DIAGNOSIS — Z833 Family history of diabetes mellitus: Secondary | ICD-10-CM | POA: Diagnosis not present

## 2022-02-05 DIAGNOSIS — F1020 Alcohol dependence, uncomplicated: Secondary | ICD-10-CM | POA: Diagnosis not present

## 2022-02-05 DIAGNOSIS — N1832 Chronic kidney disease, stage 3b: Secondary | ICD-10-CM | POA: Diagnosis present

## 2022-02-05 DIAGNOSIS — I700 Atherosclerosis of aorta: Secondary | ICD-10-CM | POA: Diagnosis present

## 2022-02-05 DIAGNOSIS — E872 Acidosis, unspecified: Secondary | ICD-10-CM | POA: Diagnosis present

## 2022-02-05 DIAGNOSIS — K7030 Alcoholic cirrhosis of liver without ascites: Secondary | ICD-10-CM | POA: Diagnosis present

## 2022-02-05 DIAGNOSIS — Z8616 Personal history of COVID-19: Secondary | ICD-10-CM | POA: Diagnosis not present

## 2022-02-05 DIAGNOSIS — I130 Hypertensive heart and chronic kidney disease with heart failure and stage 1 through stage 4 chronic kidney disease, or unspecified chronic kidney disease: Secondary | ICD-10-CM | POA: Diagnosis present

## 2022-02-05 DIAGNOSIS — F10931 Alcohol use, unspecified with withdrawal delirium: Secondary | ICD-10-CM | POA: Diagnosis not present

## 2022-02-05 LAB — COMPREHENSIVE METABOLIC PANEL
ALT: 11 U/L (ref 0–44)
AST: 18 U/L (ref 15–41)
Albumin: 3.2 g/dL — ABNORMAL LOW (ref 3.5–5.0)
Alkaline Phosphatase: 42 U/L (ref 38–126)
Anion gap: 11 (ref 5–15)
BUN: 33 mg/dL — ABNORMAL HIGH (ref 8–23)
CO2: 21 mmol/L — ABNORMAL LOW (ref 22–32)
Calcium: 8.3 mg/dL — ABNORMAL LOW (ref 8.9–10.3)
Chloride: 111 mmol/L (ref 98–111)
Creatinine, Ser: 1.79 mg/dL — ABNORMAL HIGH (ref 0.44–1.00)
GFR, Estimated: 31 mL/min — ABNORMAL LOW (ref >60)
Glucose, Bld: 89 mg/dL (ref 70–99)
Potassium: 3.4 mmol/L — ABNORMAL LOW (ref 3.5–5.1)
Sodium: 143 mmol/L (ref 135–145)
Total Bilirubin: 1.3 mg/dL — ABNORMAL HIGH (ref 0.3–1.2)
Total Protein: 6.9 g/dL (ref 6.5–8.1)

## 2022-02-05 LAB — CBC
HCT: 26.8 % — ABNORMAL LOW (ref 36.0–46.0)
Hemoglobin: 8.8 g/dL — ABNORMAL LOW (ref 12.0–15.0)
MCH: 30.4 pg (ref 26.0–34.0)
MCHC: 32.8 g/dL (ref 30.0–36.0)
MCV: 92.7 fL (ref 80.0–100.0)
Platelets: 193 10*3/uL (ref 150–400)
RBC: 2.89 MIL/uL — ABNORMAL LOW (ref 3.87–5.11)
RDW: 14.6 % (ref 11.5–15.5)
WBC: 6.4 10*3/uL (ref 4.0–10.5)
nRBC: 0 % (ref 0.0–0.2)

## 2022-02-05 LAB — HIV ANTIBODY (ROUTINE TESTING W REFLEX): HIV Screen 4th Generation wRfx: NONREACTIVE

## 2022-02-05 MED ORDER — LIP MEDEX EX OINT
TOPICAL_OINTMENT | CUTANEOUS | Status: DC | PRN
Start: 2022-02-05 — End: 2022-02-07
  Filled 2022-02-05: qty 7

## 2022-02-05 MED ORDER — NYSTATIN 100000 UNIT/GM EX POWD
Freq: Two times a day (BID) | CUTANEOUS | Status: DC
  Filled 2022-02-05: qty 15

## 2022-02-05 NOTE — Progress Notes (Addendum)
PROGRESS NOTE  [name] LZJ:[id number] DOB: 1956/03/16 DOA: 02/04/2022 PCP: [name], MD   LOS: 0 days   Brief Narrative / Interim history:  66 y.o. female with medical history significant of alcohol abuse with withdrawal seizures, left breast cancer status postmastectomy and radiation, morbid obesity, hypertension, alcoholic liver disease, CKD 3B, chronic diastolic CHF who comes to the hospital after witnessed seizure by the family.  She received Versed per EMS and she was brought to the hospital.   Subjective / 24h Interval events: No seizures overnight.  Seems lethargic and intermittently confused this morning.  She briefly wakes up and asking to go home but then drifts back asleep  Assesement and Plan: Principal Problem:   Alcohol withdrawal seizure ([place]) Active Problems:   Anemia of chronic disease   Essential hypertension   Alcohol dependence ([place])   Malignant neoplasm of upper-outer quadrant of left breast in female, estrogen receptor positive ([place])   Morbid obesity with BMI of 50.0-59.9, adult (HCC)   S/P left mastectomy   Chronic diastolic CHF (congestive heart failure) (HCC)   Acute metabolic encephalopathy   Stage 3b chronic kidney disease (CKD) (HCC)   Liver cirrhosis, alcoholic ([place])   Possible problem Alcohol abuse, alcohol withdrawal seizures-she remains at high risk of DTs, but fortunately no further seizures overnight.  Last EtOH drink, per patient was 6/7 and she still in the high risk window.  Continue supportive care, closely monitor. D/w neurology 6/8, no clear indication for AEDs.  Active problems Acute metabolic encephalopathy-due to #1, EtOH withdrawal and abuse.  Still confused this morning. CT scan yesterday showed bilateral posterior occipital lobe hypodensities compatible with PCA territory infarcts, subacute to chronic in appearance. Will obtain an MRI, if indeed she has had CVAs sometimes in the past 6 months since her last CT, AEDs may need  to be readdressed with neurology.  Chronic kidney disease stage IIIb-creatinine earlier this year has been ranging between as low as 1.17 but that was a one-time value, most recently a month ago at 1.8.  And 2.0.  Currently at 1.7, suspect close to her baseline.  Creatinine remained stable this morning  Metabolic acidosis-due to seizure, bicarb improving today.   History of breast cancer-unfortunately still awaiting med rec to see if she is on Arimidex   Essential hypertension-awaiting med rec, continue Coreg, she is normotensive this morning   Morbid obesity-BMI 58, she would benefit from weight loss   Also need to determine whether she is taking prednisone, still awaiting med rec this morning   Chronic diastolic CHF-no significant volume overload.  Echo in April 2022 showed grade 2 diastolic dysfunction.  Some trace edema but suspect chronic, overall euvolemic today   Chronic alcoholism with relapse-TOC consult  Liver cirrhosis-seen on prior imaging in 2021.  Surprisingly LFTs were normal on admission.  Bilirubin slightly up at 1.3 this morning   Chronic anemia-because of EtOH, chronic disease.  Monitor hemoglobin, slight drop since yesterday but suspected dilutional  Scheduled Meds:  carvedilol  12.5 mg Oral BID WC   Chlorhexidine Gluconate Cloth  6 each Topical Daily   enoxaparin (LOVENOX) injection  70 mg Subcutaneous K24O   folic acid  1 mg Oral Daily   LORazepam  0-4 mg Oral Q4H   Followed by   [name] ON 02/06/2022] LORazepam  0-4 mg Oral Q8H   mouth rinse  15 mL Mouth Rinse BID   multivitamin with minerals  1 tablet Oral Daily   thiamine  100 mg Oral  Daily   Or   thiamine  100 mg Intravenous Daily   Continuous Infusions:  sodium chloride 75 mL/hr at 02/05/22 0820   PRN Meds:.acetaminophen **OR** acetaminophen, lip balm, LORazepam **OR** LORazepam, ondansetron **OR** ondansetron (ZOFRAN) IV  Diet Orders (From admission, onward)     Start     Ordered   02/05/22 0653   Diet Heart Room service appropriate? Yes; Fluid consistency: Thin  Diet effective now       Question Answer Comment  Room service appropriate? Yes   Fluid consistency: Thin      02/05/22 0653            DVT prophylaxis:    Lab Results  Component Value Date   PLT 193 02/05/2022      Code Status: Full Code  Family Communication: son updated over the phone  Status is: Observation  The patient will require care spanning > 2 midnights and should be moved to inpatient because: Persistent encephalopathy, high risk of DTs  Level of care: Stepdown  Consultants:  none  Objective: Vitals:   02/05/22 0818 02/05/22 0900 02/05/22 1000 02/05/22 1100  BP: 140/70 108/67 130/73 (!) 146/82  Pulse: 98 90 90 87  Resp: (!) 24 (!) '29 19 14  '$ Temp:      TempSrc:      SpO2: 99% 97% 98% 99%  Weight:      Height:        Intake/Output Summary (Last 24 hours) at 02/05/2022 1136 Last data filed at 02/05/2022 0800 Gross per 24 hour  Intake 888.93 ml  Output 1050 ml  Net -161.07 ml   Wt Readings from Last 3 Encounters:  02/04/22 (!) 139.9 kg  01/19/22 135.1 kg  09/12/21 100 kg    Examination:  Constitutional: NAD lethargic Eyes: no scleral icterus ENMT: Mucous membranes are moist.  Neck: normal, supple Respiratory: clear to auscultation bilaterally, no wheezing, no crackles.  Shallow respirations Cardiovascular: Regular rate and rhythm, no murmurs / rubs / gallops.  Trace edema Abdomen: non distended, no tenderness. Bowel sounds positive.  Musculoskeletal: no clubbing / cyanosis.  Skin: no rashes Neurologic: Lethargic but nonfocal   Data Reviewed: I have independently reviewed following labs and imaging studies   CBC Recent Labs  Lab 02/04/22 1344 02/05/22 0320  WBC 7.3 6.4  HGB 9.9* 8.8*  HCT 31.2* 26.8*  PLT 189 193  MCV 96.0 92.7  MCH 30.5 30.4  MCHC 31.7 32.8  RDW 14.6 14.6  LYMPHSABS 1.4  --   MONOABS 0.2  --   EOSABS 0.1  --   BASOSABS 0.0  --      Recent Labs  Lab 02/04/22 1344 02/04/22 1403 02/05/22 0320  NA 144  --  143  K 3.5  --  3.4*  CL 112*  --  111  CO2 17*  --  21*  GLUCOSE 73  --  89  BUN 33*  --  33*  CREATININE 1.78*  --  1.79*  CALCIUM 8.6*  --  8.3*  AST 20  --  18  ALT 11  --  11  ALKPHOS 47  --  42  BILITOT 0.6  --  1.3*  ALBUMIN 3.7  --  3.2*  INR  --  1.2  --     ------------------------------------------------------------------------------------------------------------------ No results for input(s): "CHOL", "HDL", "LDLCALC", "TRIG", "CHOLHDL", "LDLDIRECT" in the last 72 hours.  Lab Results  Component Value Date   HGBA1C 5.0 09/01/2021   ------------------------------------------------------------------------------------------------------------------ No results for input(s): "  TSH", "T4TOTAL", "T3FREE", "THYROIDAB" in the last 72 hours.  Invalid input(s): "FREET3"  Cardiac Enzymes No results for input(s): "CKMB", "TROPONINI", "MYOGLOBIN" in the last 168 hours.  Invalid input(s): "CK" ------------------------------------------------------------------------------------------------------------------    Component Value Date/Time   BNP 112.5 (H) 07/31/2020 2303    CBG: No results for input(s): "GLUCAP" in the last 168 hours.  Recent Results (from the past 240 hour(s))  MRSA Next Gen by PCR, Nasal     Status: None   Collection Time: 02/04/22  5:56 PM   Specimen: Nasal Mucosa; Nasal Swab  Result Value Ref Range Status   MRSA by PCR Next Gen NOT DETECTED NOT DETECTED Final    Comment: (NOTE) The GeneXpert MRSA Assay (FDA approved for NASAL specimens only), is one component of a comprehensive MRSA colonization surveillance program. It is not intended to diagnose MRSA infection nor to guide or monitor treatment for MRSA infections. Test performance is not FDA approved in patients less than 41 years old. Performed at [facility], [place] [address], [place], [place]  [id number]      Radiology Studies: CT Head Wo Contrast  Result Date: 02/04/2022 CLINICAL DATA:  Acute delirium, history of seizures EXAM: CT HEAD WITHOUT CONTRAST TECHNIQUE: Contiguous axial images were obtained from the base of the skull through the vertex without intravenous contrast. RADIATION DOSE REDUCTION: This exam was performed according to the departmental dose-optimization program which includes automated exposure control, adjustment of the mA and/or kV according to patient size and/or use of iterative reconstruction technique. COMPARISON:  08/31/2021 FINDINGS: Brain: Limited with motion artifact. Similar age related brain atrophy pattern. Bilateral posterior occipital lobe cortical hypodensities, which are new since 08/31/2021 compatible with occipital PCA territory infarcts, subacute to chronic in appearance. No definite acute intracranial hemorrhage, new mass lesion, hydrocephalus, herniation, or large extra-axial fluid collection. Cisterns are patent. No cerebellar abnormality. Vascular: No hyperdense vessel or unexpected calcification. Skull: Normal. Negative for fracture or focal lesion. Sinuses/Orbits: No acute finding. Other: None. IMPRESSION: Limited exam with motion artifact. Interval bilateral posterior occipital lobe hypodensities compatible with PCA territory infarcts, subacute to chronic in appearance. Stable age related brain atrophy pattern No acute intracranial hemorrhage. Electronically Signed   By: Jerilynn Mages.  Shick M.D.   On: 02/04/2022 14:55   DG Chest Port 1 View  Result Date: 02/04/2022 CLINICAL DATA:  Altered mental status, history of seizures EXAM: PORTABLE CHEST 1 VIEW COMPARISON:  09/04/2021 FINDINGS: Transverse diameter of heart is increased. There are no signs of pulmonary edema or focal pulmonary consolidation. There is no pleural effusion or pneumothorax. Tip of right chest port is seen in the superior vena cava close to the right atrium. Surgical clips are seen in the left  axilla and left chest wall. IMPRESSION: Cardiomegaly. There are no signs of pulmonary edema or focal pulmonary consolidation. Electronically Signed   By: Elmer Picker M.D.   On: 02/04/2022 13:45     Marzetta Board, MD, PhD [facility]  Between 7 am - 7 pm I am available, please contact me via Amion (for emergencies) or Securechat (non urgent messages)  Between 7 pm - 7 am I am not available, please contact night coverage MD/APP via Amion

## 2022-02-05 NOTE — Progress Notes (Signed)
Chaplain received a page that family wanted to discuss HCPOA at 4:00 when they returned.  Chaplain was at patient room at 4:00 and family was not there, but chaplain determined that they could not fill out any legal documents until patient was fully detoxed and oriented.  Chaplain left paperwork for family to review.  Tina, Bcc PAger, [phone]

## 2022-02-06 ENCOUNTER — Inpatient Hospital Stay (HOSPITAL_COMMUNITY): Payer: Medicare Other

## 2022-02-06 DIAGNOSIS — G9341 Metabolic encephalopathy: Secondary | ICD-10-CM

## 2022-02-06 DIAGNOSIS — F10931 Alcohol use, unspecified with withdrawal delirium: Secondary | ICD-10-CM | POA: Diagnosis not present

## 2022-02-06 DIAGNOSIS — K7030 Alcoholic cirrhosis of liver without ascites: Secondary | ICD-10-CM | POA: Diagnosis not present

## 2022-02-06 DIAGNOSIS — Z9012 Acquired absence of left breast and nipple: Secondary | ICD-10-CM

## 2022-02-06 DIAGNOSIS — Z6841 Body Mass Index (BMI) 40.0 and over, adult: Secondary | ICD-10-CM

## 2022-02-06 DIAGNOSIS — F1020 Alcohol dependence, uncomplicated: Secondary | ICD-10-CM

## 2022-02-06 DIAGNOSIS — N1832 Chronic kidney disease, stage 3b: Secondary | ICD-10-CM

## 2022-02-06 LAB — CBC WITH DIFFERENTIAL/PLATELET
Abs Immature Granulocytes: 0.02 10*3/uL (ref 0.00–0.07)
Basophils Absolute: 0 10*3/uL (ref 0.0–0.1)
Basophils Relative: 0 %
Eosinophils Absolute: 0.2 10*3/uL (ref 0.0–0.5)
Eosinophils Relative: 3 %
HCT: 29 % — ABNORMAL LOW (ref 36.0–46.0)
Hemoglobin: 9.4 g/dL — ABNORMAL LOW (ref 12.0–15.0)
Immature Granulocytes: 0 %
Lymphocytes Relative: 32 %
Lymphs Abs: 1.7 10*3/uL (ref 0.7–4.0)
MCH: 30.1 pg (ref 26.0–34.0)
MCHC: 32.4 g/dL (ref 30.0–36.0)
MCV: 92.9 fL (ref 80.0–100.0)
Monocytes Absolute: 0.4 10*3/uL (ref 0.1–1.0)
Monocytes Relative: 8 %
Neutro Abs: 2.9 10*3/uL (ref 1.7–7.7)
Neutrophils Relative %: 57 %
Platelets: 176 10*3/uL (ref 150–400)
RBC: 3.12 MIL/uL — ABNORMAL LOW (ref 3.87–5.11)
RDW: 14.5 % (ref 11.5–15.5)
WBC: 5.2 10*3/uL (ref 4.0–10.5)
nRBC: 0 % (ref 0.0–0.2)

## 2022-02-06 LAB — COMPREHENSIVE METABOLIC PANEL
ALT: 9 U/L (ref 0–44)
AST: 15 U/L (ref 15–41)
Albumin: 3.3 g/dL — ABNORMAL LOW (ref 3.5–5.0)
Alkaline Phosphatase: 42 U/L (ref 38–126)
Anion gap: 8 (ref 5–15)
BUN: 26 mg/dL — ABNORMAL HIGH (ref 8–23)
CO2: 23 mmol/L (ref 22–32)
Calcium: 9 mg/dL (ref 8.9–10.3)
Chloride: 111 mmol/L (ref 98–111)
Creatinine, Ser: 1.53 mg/dL — ABNORMAL HIGH (ref 0.44–1.00)
GFR, Estimated: 37 mL/min — ABNORMAL LOW (ref >60)
Glucose, Bld: 118 mg/dL — ABNORMAL HIGH (ref 70–99)
Potassium: 3.2 mmol/L — ABNORMAL LOW (ref 3.5–5.1)
Sodium: 142 mmol/L (ref 135–145)
Total Bilirubin: 1.1 mg/dL (ref 0.3–1.2)
Total Protein: 7.2 g/dL (ref 6.5–8.1)

## 2022-02-06 LAB — PHOSPHORUS: Phosphorus: 2.8 mg/dL (ref 2.5–4.6)

## 2022-02-06 LAB — MAGNESIUM: Magnesium: 1.5 mg/dL — ABNORMAL LOW (ref 1.7–2.4)

## 2022-02-06 IMAGING — MR MR HEAD W/O CM
10 series · 48 of 48 positions shown · non-contrast
Comparison: Head CT [DATE] and MRI [DATE]

CLINICAL DATA: Mental status change, unknown cause.

EXAM:
MRI HEAD WITHOUT CONTRAST
TECHNIQUE: Multiplanar, multiecho pulse sequences of the brain and surrounding
structures were obtained without intravenous contrast.

[Series 5: DWI · axial · 3.0mm · 1.36mm/px · z∈[-61,+81]mm · 9 of 108 slices shown (1 of 2)]
[im 1/108]
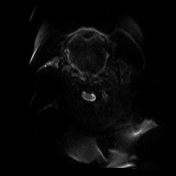
[im 14/108]
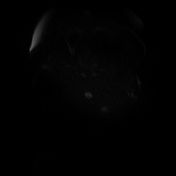
[im 27/108]
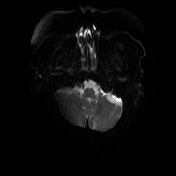
[im 41/108]
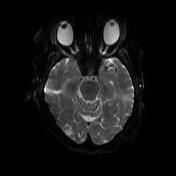
[im 54/108]
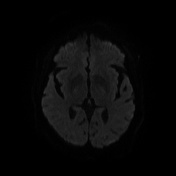
[im 67/108]
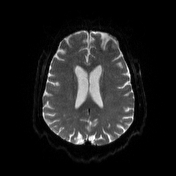
[im 81/108]
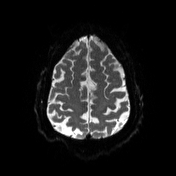
[im 94/108]
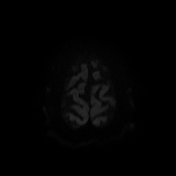
[im 108/108]
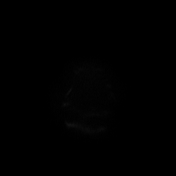

[Series 6: DWI · axial · 3.0mm · 1.36mm/px · z∈[-61,+81]mm · 4 of 54 slices shown (2 of 2)]
[im 1/54]
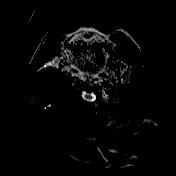
[im 18/54]
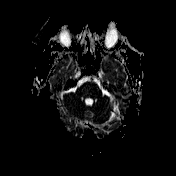
[im 36/54]
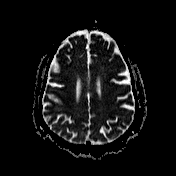
[im 54/54]
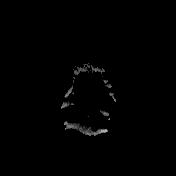

[Series 7: T1 · sagittal · 5.0mm · 0.75mm/px · 2 of 26 slices shown (1 of 2)]
[im 1/26]
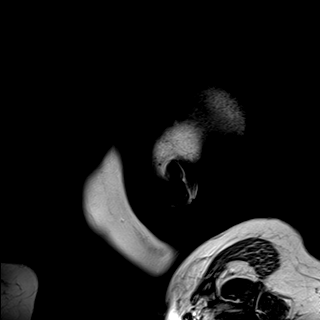
[im 26/26]
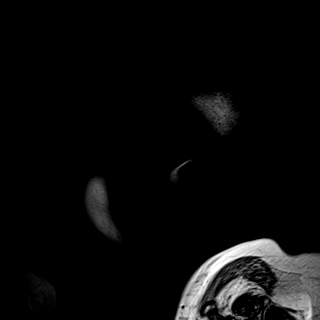

[Series 8: T2 · axial · 5.0mm · 0.62mm/px · z∈[-53,+86]mm · 2 of 24 slices shown (1 of 2)]
[im 1/24]
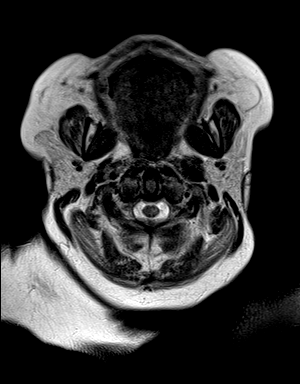
[im 24/24]
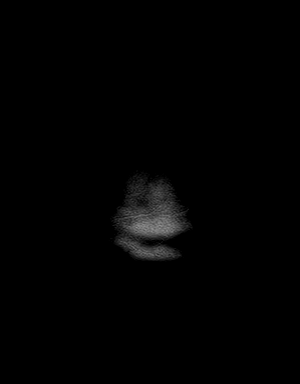

[Series 9: swi_images · axial · 3.0mm · 0.75mm/px · z∈[-52,+84]mm · 4 of 52 slices shown]
[im 1/52]
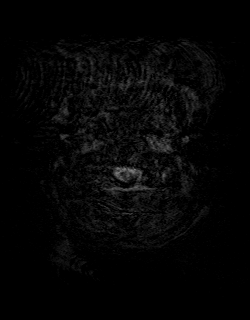
[im 18/52]
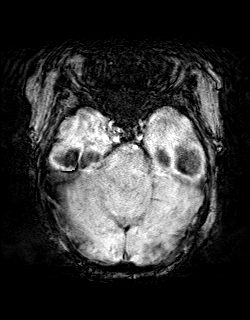
[im 35/52]
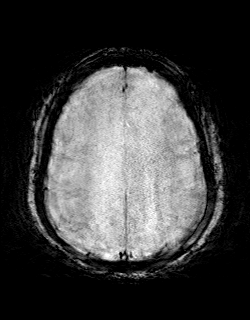
[im 52/52]
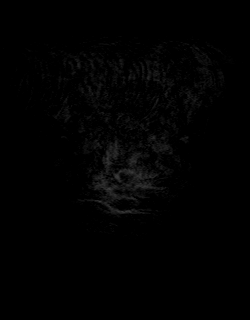

[Series 11: FLAIR · axial · 3.0mm · 0.75mm/px · z∈[-53,+86]mm · 4 of 53 slices shown]
[im 1/53]
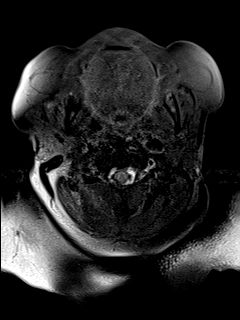
[im 18/53]
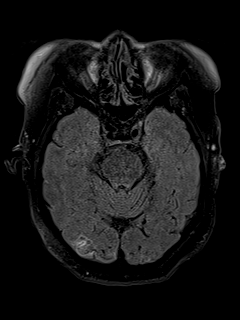
[im 35/53]
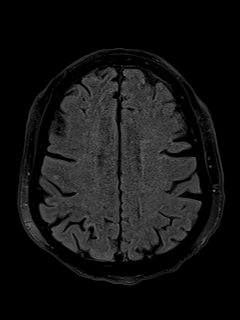
[im 53/53]
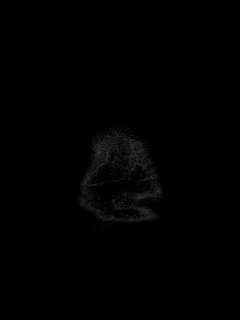

[Series 12: T1 · axial · 1.0mm · 0.94mm/px · z∈[-56,+86]mm · 13 of 160 slices shown (2 of 2)]
[im 1/160]
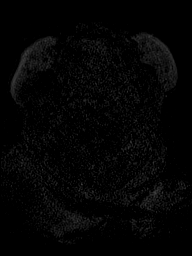
[im 14/160]
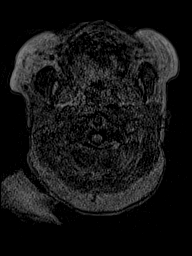
[im 27/160]
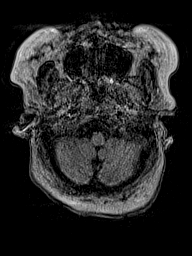
[im 40/160]
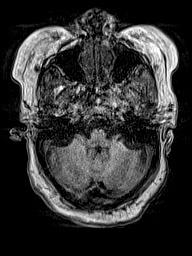
[im 54/160]
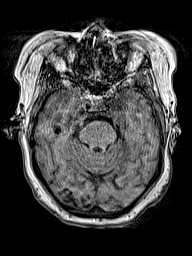
[im 67/160]
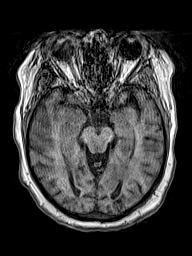
[im 80/160]
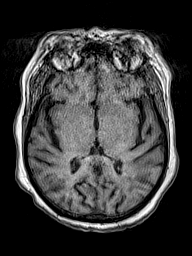
[im 93/160]
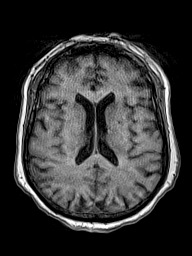
[im 107/160]
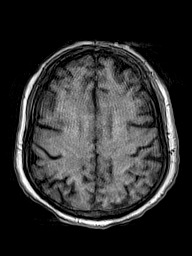
[im 120/160]
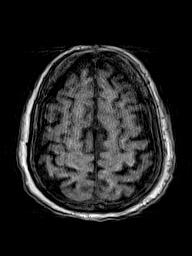
[im 133/160]
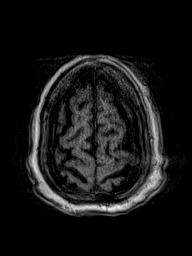
[im 146/160]
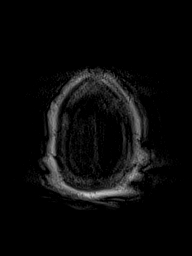
[im 160/160]
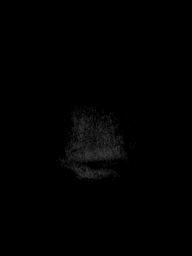

[Series 13: cor dwi_tracew · coronal · 5.0mm · 1.53mm/px · 5 of 56 slices shown]
[im 1/56]
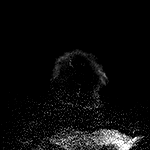
[im 14/56]
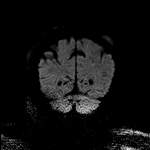
[im 28/56]
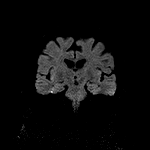
[im 42/56]
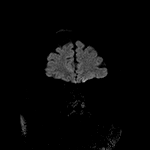
[im 56/56]
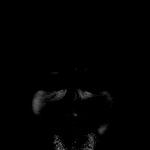

[Series 14: cor dwi_adc · coronal · 5.0mm · 1.53mm/px · 2 of 28 slices shown]
[im 1/28]
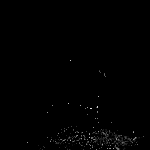
[im 28/28]
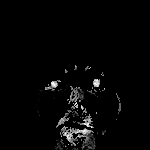

[Series 15: T2 · coronal · 5.0mm · 0.57mm/px · 3 of 32 slices shown (2 of 2)]
[im 1/32]
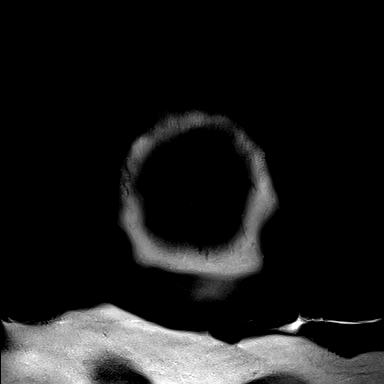
[im 16/32]
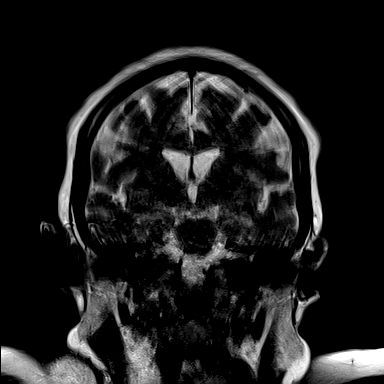
[im 32/32]
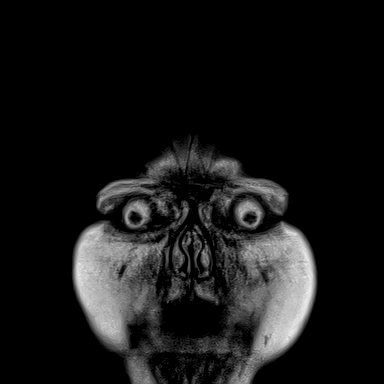

[48 of 48 positions shown; findings below may reference images not displayed]

FINDINGS: The study is intermittently mildly to moderately motion degraded.

Brain: There is no evidence of an acute infarct, mass, midline
shift, or extra-axial fluid collection. There are chronic bilateral
occipital lobe infarcts with hemosiderin deposition which are new
from the prior MRI. Scattered small T2 hyperintensities elsewhere in
the cerebral white matter bilaterally are nonspecific but compatible
with mild chronic small vessel ischemic disease with assessment for
interval change limited by motion artifact on the prior MRI. Mild
generalized cerebral atrophy is within normal limits for age.

Vascular: Major intracranial vascular flow voids are preserved.

Skull and upper cervical spine: Unremarkable bone marrow signal.

Sinuses/Orbits: Unremarkable orbits. Small mucous retention cyst in
the left maxillary sinus. Clear mastoid air cells.

Other: None.
IMPRESSION: 1. No acute intracranial abnormality.
2. Chronic bilateral occipital lobe infarcts.
3. Mild chronic small vessel ischemic disease.

## 2022-02-06 MED ORDER — POTASSIUM CHLORIDE 20 MEQ PO PACK
40.0000 meq | PACK | Freq: Two times a day (BID) | ORAL | Status: AC
Start: 2022-02-06 — End: 2022-02-06
  Administered 2022-02-06 (×2): 40 meq via ORAL
  Filled 2022-02-06 (×2): qty 2

## 2022-02-06 MED ORDER — TRAZODONE HCL 50 MG PO TABS
50.0000 mg | ORAL_TABLET | Freq: Every day | ORAL | Status: DC
  Administered 2022-02-06: 50 mg via ORAL
  Filled 2022-02-06: qty 1

## 2022-02-06 MED ORDER — FUROSEMIDE 20 MG PO TABS
20.0000 mg | ORAL_TABLET | Freq: Every morning | ORAL | Status: DC
  Administered 2022-02-06 – 2022-02-07 (×2): 20 mg via ORAL
  Filled 2022-02-06 (×2): qty 1

## 2022-02-06 MED ORDER — HYDRALAZINE HCL 20 MG/ML IJ SOLN
10.0000 mg | Freq: Four times a day (QID) | INTRAMUSCULAR | Status: AC | PRN
  Administered 2022-02-06 – 2022-02-07 (×2): 10 mg via INTRAVENOUS
  Filled 2022-02-06 (×2): qty 1

## 2022-02-06 MED ORDER — MAGNESIUM SULFATE 2 GM/50ML IV SOLN
2.0000 g | Freq: Once | INTRAVENOUS | Status: AC
Start: 2022-02-06 — End: 2022-02-06
  Administered 2022-02-06: 2 g via INTRAVENOUS
  Filled 2022-02-06: qty 50

## 2022-02-06 MED ORDER — AMLODIPINE BESYLATE 10 MG PO TABS
10.0000 mg | ORAL_TABLET | Freq: Every morning | ORAL | Status: DC
  Administered 2022-02-06 – 2022-02-07 (×2): 10 mg via ORAL
  Filled 2022-02-06 (×2): qty 1

## 2022-02-06 MED ORDER — GABAPENTIN 300 MG PO CAPS
300.0000 mg | ORAL_CAPSULE | Freq: Every day | ORAL | Status: DC
  Administered 2022-02-06: 300 mg via ORAL
  Filled 2022-02-06: qty 1

## 2022-02-06 NOTE — Progress Notes (Signed)
PROGRESS NOTE    [name]  XIP:[id number] DOB: 1956-05-18 DOA: 02/04/2022 PCP: [name], MD   Brief Narrative:   66 y.o. female with medical history significant of alcohol abuse with withdrawal seizures, left breast cancer status postmastectomy and radiation, morbid obesity, hypertension, alcoholic liver disease, CKD 3B, chronic diastolic CHF who comes to the hospital after witnessed seizure by the family.  She received Versed per EMS and she was brought to the hospital.   Assessment & Plan:  Alcohol abuse alcohol withdrawal seizures: -she remains at high risk of Dts - no further seizures overnight.  -Last EtOH drink, per patient was 6/7 and she still in the high risk window. -Continue supportive care, closely monitor.  -Neurology recommended no clear indication for AEDs at this time. -Continue thiamine, multivitamin, folic acid.  Continue CIWA protocol   Acute metabolic encephalopathy -due to #1, EtOH withdrawal and abuse.   -CT scan showed bilateral posterior occipital lobe hypodensities compatible with PCA territory infarcts, subacute to chronic in appearance.  -MRI brain is pending -Await PT evaluation   Chronic kidney disease stage IIIb -creatinine earlier this year has been ranging between as low as 1.17 but that was a one-time value, most recently a month ago at 1.8.  -Stable at baseline.  Metabolic acidosis-due to seizure, -Resolved   Essential hypertension: -Continue amlodipine and Coreg -Monitor blood pressure closely  Morbid obesity-BMI 58, she would benefit from weight loss   Chronic diastolic CHF-no significant volume overload.  Echo in April 2022 showed grade 2 diastolic dysfunction.  Some trace edema but suspect chronic, overall euvolemic today -Continue home Lasix   Chronic alcoholism with relapse-TOC consult  Liver cirrhosis-seen on prior imaging in 2021.  Surprisingly LFTs were normal on admission.  -Follow-up with PCP outpatient  Chronic  anemia-because of EtOH, chronic disease.  Monitor hemoglobin  Hypokalemia: Replenished -Repeat BMP tomorrow a.m.  Hypomagnesemia: Replenished.  Repeat magnesium level tomorrow a.m.  History of breast cancer: Not on any medications at home.  DVT prophylaxis: Lovenox Code Status: Full code Family Communication:  None present at bedside.  Plan of care discussed with patient in length and she verbalized understanding and agreed with it.  I called patient's granddaughter to discuss plan of care with no response.  I left voice message.  Disposition Plan: To be determined  Consultants:  None  Procedures:  None  Antimicrobials:  None  Status is: Inpatient     Subjective: Patient seen and examined.  Lying comfortably on the bed.  Denies any complaints.  As per RN no seizure episodes since she has been in the hospital.  No acute events overnight.  Objective: Vitals:   02/06/22 0500 02/06/22 0600 02/06/22 0800 02/06/22 0830  BP: (!) 167/83 (!) 143/88 (!) 157/78   Pulse: 80 82 81   Resp: (!) 27 (!) 26 (!) (P) 26   Temp:    98.6 F (37 C)  TempSrc:    Axillary  SpO2: 98% 99% 98%   Weight:      Height:        Intake/Output Summary (Last 24 hours) at 02/06/2022 1051 Last data filed at 02/06/2022 0600 Gross per 24 hour  Intake 1831.5 ml  Output 1800 ml  Net 31.5 ml   Filed Weights   02/04/22 1302 02/04/22 1800  Weight: 135.1 kg (!) 139.9 kg    Examination:  General exam: Appears calm and comfortable, on room air, obese, communicating well Respiratory system: Clear to auscultation. Respiratory effort normal. Cardiovascular system:  S1 & S2 heard, RRR. No JVD, murmurs, rubs, gallops or clicks. No pedal edema. Gastrointestinal system: Abdomen is nondistended, soft and nontender. No organomegaly or masses felt. Normal bowel sounds heard. Central nervous system: Alert and oriented.  Following commands, moving all extremities equally.   Skin: No rashes, lesions or  ulcers   Data Reviewed: I have personally reviewed following labs and imaging studies  CBC: Recent Labs  Lab 02/04/22 1344 02/05/22 0320 02/06/22 0536  WBC 7.3 6.4 5.2  NEUTROABS 5.5  --  2.9  HGB 9.9* 8.8* 9.4*  HCT 31.2* 26.8* 29.0*  MCV 96.0 92.7 92.9  PLT 189 193 676   Basic Metabolic Panel: Recent Labs  Lab 02/04/22 1344 02/05/22 0320 02/06/22 0536  NA 144 143 142  K 3.5 3.4* 3.2*  CL 112* 111 111  CO2 17* 21* 23  GLUCOSE 73 89 118*  BUN 33* 33* 26*  CREATININE 1.78* 1.79* 1.53*  CALCIUM 8.6* 8.3* 9.0  MG  --   --  1.5*  PHOS  --   --  2.8   GFR: Estimated Creatinine Clearance: 48.3 mL/min (A) (by C-G formula based on SCr of 1.53 mg/dL (H)). Liver Function Tests: Recent Labs  Lab 02/04/22 1344 02/05/22 0320 02/06/22 0536  AST '20 18 15  '$ ALT '11 11 9  '$ ALKPHOS 47 42 42  BILITOT 0.6 1.3* 1.1  PROT 7.9 6.9 7.2  ALBUMIN 3.7 3.2* 3.3*   No results for input(s): "LIPASE", "AMYLASE" in the last 168 hours. No results for input(s): "AMMONIA" in the last 168 hours. Coagulation Profile: Recent Labs  Lab 02/04/22 1403  INR 1.2   Cardiac Enzymes: No results for input(s): "CKTOTAL", "CKMB", "CKMBINDEX", "TROPONINI" in the last 168 hours. BNP (last 3 results) No results for input(s): "PROBNP" in the last 8760 hours. HbA1C: No results for input(s): "HGBA1C" in the last 72 hours. CBG: No results for input(s): "GLUCAP" in the last 168 hours. Lipid Profile: No results for input(s): "CHOL", "HDL", "LDLCALC", "TRIG", "CHOLHDL", "LDLDIRECT" in the last 72 hours. Thyroid Function Tests: No results for input(s): "TSH", "T4TOTAL", "FREET4", "T3FREE", "THYROIDAB" in the last 72 hours. Anemia Panel: No results for input(s): "VITAMINB12", "FOLATE", "FERRITIN", "TIBC", "IRON", "RETICCTPCT" in the last 72 hours. Sepsis Labs: No results for input(s): "PROCALCITON", "LATICACIDVEN" in the last 168 hours.  Recent Results (from the past 240 hour(s))  MRSA Next Gen by PCR,  Nasal     Status: None   Collection Time: 02/04/22  5:56 PM   Specimen: Nasal Mucosa; Nasal Swab  Result Value Ref Range Status   MRSA by PCR Next Gen NOT DETECTED NOT DETECTED Final    Comment: (NOTE) The GeneXpert MRSA Assay (FDA approved for NASAL specimens only), is one component of a comprehensive MRSA colonization surveillance program. It is not intended to diagnose MRSA infection nor to guide or monitor treatment for MRSA infections. Test performance is not FDA approved in patients less than 73 years old. Performed at [facility], [place] [address], [place], [place] [address]       Radiology Studies: CT Head Wo Contrast  Result Date: 02/04/2022 CLINICAL DATA:  Acute delirium, history of seizures EXAM: CT HEAD WITHOUT CONTRAST TECHNIQUE: Contiguous axial images were obtained from the base of the skull through the vertex without intravenous contrast. RADIATION DOSE REDUCTION: This exam was performed according to the departmental dose-optimization program which includes automated exposure control, adjustment of the mA and/or kV according to patient size and/or use of iterative reconstruction  technique. COMPARISON:  08/31/2021 FINDINGS: Brain: Limited with motion artifact. Similar age related brain atrophy pattern. Bilateral posterior occipital lobe cortical hypodensities, which are new since 08/31/2021 compatible with occipital PCA territory infarcts, subacute to chronic in appearance. No definite acute intracranial hemorrhage, new mass lesion, hydrocephalus, herniation, or large extra-axial fluid collection. Cisterns are patent. No cerebellar abnormality. Vascular: No hyperdense vessel or unexpected calcification. Skull: Normal. Negative for fracture or focal lesion. Sinuses/Orbits: No acute finding. Other: None. IMPRESSION: Limited exam with motion artifact. Interval bilateral posterior occipital lobe hypodensities compatible with PCA territory infarcts, subacute to chronic  in appearance. Stable age related brain atrophy pattern No acute intracranial hemorrhage. Electronically Signed   By: Jerilynn Mages.  Shick M.D.   On: 02/04/2022 14:55   DG Chest Port 1 View  Result Date: 02/04/2022 CLINICAL DATA:  Altered mental status, history of seizures EXAM: PORTABLE CHEST 1 VIEW COMPARISON:  09/04/2021 FINDINGS: Transverse diameter of heart is increased. There are no signs of pulmonary edema or focal pulmonary consolidation. There is no pleural effusion or pneumothorax. Tip of right chest port is seen in the superior vena cava close to the right atrium. Surgical clips are seen in the left axilla and left chest wall. IMPRESSION: Cardiomegaly. There are no signs of pulmonary edema or focal pulmonary consolidation. Electronically Signed   By: Elmer Picker M.D.   On: 02/04/2022 13:45    Scheduled Meds:  carvedilol  12.5 mg Oral BID WC   Chlorhexidine Gluconate Cloth  6 each Topical Daily   enoxaparin (LOVENOX) injection  70 mg Subcutaneous R51O   folic acid  1 mg Oral Daily   LORazepam  0-4 mg Oral Q4H   Followed by   LORazepam  0-4 mg Oral Q8H   mouth rinse  15 mL Mouth Rinse BID   multivitamin with minerals  1 tablet Oral Daily   nystatin   Topical BID   potassium chloride  40 mEq Oral BID   thiamine  100 mg Oral Daily   Or   thiamine  100 mg Intravenous Daily   Continuous Infusions:  sodium chloride 75 mL/hr at 02/06/22 0600     LOS: 1 day   Time spent: 35 minutes   Ruberta Holck Loann Quill, MD [facility]  If 7PM-7AM, please contact night-coverage [email or web address] 02/06/2022, 10:51 AM

## 2022-02-06 NOTE — Evaluation (Signed)
Physical Therapy Evaluation Patient Details Name: [name] MRN: [id number] DOB: 1956-08-01 Today's Date: 02/06/2022  History of Present Illness  66 y.o. female with medical history significant of alcohol abuse with withdrawal seizures, left breast cancer status postmastectomy and radiation, morbid obesity, hypertension, alcoholic liver disease, CKD 3B, chronic diastolic CHF who comes to the hospital after witnessed seizure by the family and admitted for alchohol withdrawal seizure  Clinical Impression  Pt admitted with above diagnosis.  Pt currently with functional limitations due to the deficits listed below (see PT Problem List). Pt will benefit from skilled PT to increase their independence and safety with mobility to allow discharge to the venue listed below.   Pt assisted OOB and had BM on bed pad.  Pt assisted with ambulating to bathroom. Pt initially min assist however progressed to min/guard.  Pt reports her granddaughter assists her at home as needed.          Recommendations for follow up therapy are one component of a multi-disciplinary discharge planning process, led by the attending physician.  Recommendations may be updated based on patient status, additional functional criteria and insurance authorization.  Follow Up Recommendations No PT follow up    Assistance Recommended at Discharge Intermittent Supervision/Assistance  Patient can return home with the following  A little help with walking and/or transfers;A little help with bathing/dressing/bathroom;Help with stairs or ramp for entrance    Equipment Recommendations None recommended by PT  Recommendations for Other Services       Functional Status Assessment Patient has had a recent decline in their functional status and demonstrates the ability to make significant improvements in function in a reasonable and predictable amount of time.     Precautions / Restrictions Precautions Precautions: Fall       Mobility  Bed Mobility Overal bed mobility: Needs Assistance Bed Mobility: Supine to Sit     Supine to sit: Min guard, HOB elevated     General bed mobility comments: close guard for safety    Transfers Overall transfer level: Needs assistance Equipment used: Rolling walker (2 wheels) Transfers: Sit to/from Stand Sit to Stand: Min assist, +2 safety/equipment           General transfer comment: initially min assist to rise and steady, able to perform sit to stand using grab bar in bathroom and min/guard from toilet    Ambulation/Gait Ambulation/Gait assistance: Min guard, Min assist Gait Distance (Feet): 15 Feet Assistive device: Rolling walker (2 wheels) Gait Pattern/deviations: Step-through pattern, Decreased stride length, Wide base of support       General Gait Details: approx 15 feet total into and out of bathroom, pt fatigued quickly; BP elevated to 160/ 97 mmHg and RN in room and aware  Stairs            Wheelchair Mobility    Modified Rankin (Stroke Patients Only)       Balance Overall balance assessment: Mild deficits observed, not formally tested                                           Pertinent Vitals/Pain Pain Assessment Pain Assessment: No/denies pain    Home Living Family/patient expects to be discharged to:: Private residence Living Arrangements: Other (Comment) (granddaughter) Available Help at Discharge: Family Type of Home: Apartment Home Access: Level entry       Home Layout: One level Home  Equipment: Conservation officer, nature (2 wheels);Cane - single point      Prior Function Prior Level of Function : Needs assist             Mobility Comments: reports ambulating with RW ADLs Comments: states granddaughter assists with pericare     Hand Dominance        Extremity/Trunk Assessment        Lower Extremity Assessment Lower Extremity Assessment: Generalized weakness    Cervical / Trunk  Assessment Cervical / Trunk Assessment: Other exceptions Cervical / Trunk Exceptions: body habitus limiting  Communication   Communication: No difficulties  Cognition Arousal/Alertness: Awake/alert Behavior During Therapy: Flat affect Overall Cognitive Status: Within Functional Limits for tasks assessed                                          General Comments      Exercises     Assessment/Plan    PT Assessment Patient needs continued PT services  PT Problem List Decreased strength;Decreased activity tolerance;Decreased mobility;Decreased balance;Decreased knowledge of use of DME;Obesity       PT Treatment Interventions DME instruction;Gait training;Therapeutic exercise;Balance training;Functional mobility training;Therapeutic activities;Patient/family education    PT Goals (Current goals can be found in the Care Plan section)  Acute Rehab PT Goals PT Goal Formulation: With patient Time For Goal Achievement: 02/20/22 Potential to Achieve Goals: Good    Frequency Min 3X/week     Co-evaluation               AM-PAC PT "6 Clicks" Mobility  Outcome Measure Help needed turning from your back to your side while in a flat bed without using bedrails?: A Little Help needed moving from lying on your back to sitting on the side of a flat bed without using bedrails?: A Little Help needed moving to and from a bed to a chair (including a wheelchair)?: A Little Help needed standing up from a chair using your arms (e.g., wheelchair or bedside chair)?: A Little Help needed to walk in hospital room?: A Lot Help needed climbing 3-5 steps with a railing? : A Lot 6 Click Score: 16    End of Session   Activity Tolerance: Patient limited by fatigue Patient left: in chair;with call bell/phone within reach;with nursing/sitter in room Nurse Communication: Mobility status PT Visit Diagnosis: Difficulty in walking, not elsewhere classified (R26.2)    Time:  5681-2751 PT Time Calculation (min) (ACUTE ONLY): 29 min   Charges:   PT Evaluation $PT Eval Low Complexity: 1 Low PT Treatments $Gait Training: 8-22 mins       Jannette Spanner PT, DPT Acute Rehabilitation Services Pager: [phone] Office: [place] 02/06/2022, 3:43 PM

## 2022-02-06 NOTE — TOC Initial Note (Signed)
Transition of Care [facility]) - Initial/Assessment Note    Patient Details  Name: [name] MRN: [id number] Date of Birth: [birth year]  Transition of Care [facility]) CM/SW Contact:    Leeroy Cha, RN Phone Number: 02/06/2022, 8:24 AM  Clinical Narrative:                  Transition of Care [facility]) Screening Note   Patient Details  Name: [name] Date of Birth: Jan 04, 1956   Transition of Care [facility]) CM/SW Contact:    Leeroy Cha, RN Phone Number: 02/06/2022, 8:25 AM    Transition of Care Department [facility]) has reviewed patient and no TOC needs have been identified at this time. We will continue to monitor patient advancement through interdisciplinary progression rounds. If new patient transition needs arise, please place a TOC consult.    Expected Discharge Plan: Home/Self Care Barriers to Discharge: Continued Medical Work up   Patient Goals and CMS Choice Patient states their goals for this hospitalization and ongoing recovery are:: to go back home CMS Medicare.gov Compare Post Acute Care list provided to:: Patient Choice offered to / list presented to : Patient  Expected Discharge Plan and Services Expected Discharge Plan: Home/Self Care       Living arrangements for the past 2 months: Apartment                                      Prior Living Arrangements/Services Living arrangements for the past 2 months: Apartment Lives with:: Self Patient language and need for interpreter reviewed:: Yes Do you feel safe going back to the place where you live?: Yes      Need for Family Participation in Patient Care: Yes (Comment) (has good family support)     Criminal Activity/Legal Involvement Pertinent to Current Situation/Hospitalization: No - Comment as needed  Activities of Daily Living Home Assistive Devices/Equipment: Other (Comment) (pt unable to verbalize what equipment she uses) ADL Screening (condition at time of admission) Patient's  cognitive ability adequate to safely complete daily activities?: No Is the patient deaf or have difficulty hearing?: No Does the patient have difficulty seeing, even when wearing glasses/contacts?: No Does the patient have difficulty concentrating, remembering, or making decisions?: Yes Patient able to express need for assistance with ADLs?: No Does the patient have difficulty dressing or bathing?: Yes Independently performs ADLs?: No Communication: Needs assistance Is this a change from baseline?: Pre-admission baseline Dressing (OT): Needs assistance Is this a change from baseline?: Pre-admission baseline Grooming: Needs assistance Is this a change from baseline?: Pre-admission baseline Feeding: Needs assistance Is this a change from baseline?: Pre-admission baseline Bathing: Needs assistance Is this a change from baseline?: Pre-admission baseline Toileting: Needs assistance Is this a change from baseline?: Pre-admission baseline In/Out Bed: Needs assistance Is this a change from baseline?: Pre-admission baseline Walks in Home: Needs assistance Is this a change from baseline?: Pre-admission baseline Does the patient have difficulty walking or climbing stairs?: Yes Weakness of Legs: Both Weakness of Arms/Hands: Both  Permission Sought/Granted                  Emotional Assessment Appearance:: Appears stated age Attitude/Demeanor/Rapport: Gracious, Self-Confident Affect (typically observed): Calm Orientation: : Oriented to Self, Oriented to Place, Oriented to  Time, Oriented to Situation Alcohol / Substance Use: Alcohol Use Psych Involvement: No (comment)  Admission diagnosis:  Alcohol withdrawal seizure ([place]) [U20.254, R56.9] Alcohol withdrawal seizure  with delirium ([place]) [E31.540, R56.9] Patient Active Problem List   Diagnosis Date Noted   Stage 3b chronic kidney disease (CKD) ([place]) 02/04/2022   Liver cirrhosis, alcoholic ([place]) 08/67/6195   Cirrhosis of liver  without ascites ([place])    Alcohol withdrawal seizure with delirium ([place]) 08/31/2021   Acute respiratory failure with hypoxia ([place]) 08/31/2021   UTI (urinary tract infection) 08/31/2021   Alcohol withdrawal seizure ([place]) 08/31/2021   Benign neoplasm of colon    Atypical glandular cells on cervical Pap smear 09/32/6712   Acute metabolic encephalopathy 45/80/9983   COVID-19 virus infection 08/01/2020   COVID-19 08/01/2020   Chronic diastolic CHF (congestive heart failure) ([place]) 06/26/2020   Thoracic aortic aneurysm without rupture ([place]) 06/26/2020   Hyponatremia 05/31/2020   Pancytopenia ([place]) 05/31/2020   Cellulitis of thigh 04/29/2020   Tinea corporis 04/29/2020   Anemia of chronic disease 04/29/2020   Chronic kidney disease, stage 3a ([place]) 04/29/2020   Cellulitis of abdominal wall    Constipation    Folate deficiency    Hypomagnesemia    Debility    Gout 03/03/2020   Skin breakdown 03/03/2020   Skin ulcer of perineum, limited to breakdown of skin ([place]) 03/03/2020   Leukocytosis 03/03/2020   Anemia associated with chemotherapy 03/03/2020   Thrombocytopenia ([place]) 03/03/2020   Port-A-Cath in place 02/19/2020   Acute renal failure (ARF) ([place]) 01/22/2020   S/P left mastectomy 12/19/2019   Colon cancer screening 11/30/2019   Morbid obesity with BMI of 50.0-59.9, adult ([place]) 11/21/2019   Family history of ovarian cancer    Malignant neoplasm of upper-outer quadrant of left breast in female, estrogen receptor positive ([place]) 11/14/2019   Fracture of radial head, right, closed 06/25/2019   Frequent falls 01/10/2019   Bilateral bunions 01/10/2019   Toenail fungus 38/25/0539   Alcoholic hepatitis 76/73/4193   Edema 11/20/2018   Alcohol-induced mood disorder ([place]) 11/01/2013   Alcohol dependence ([place]) 11/01/2013   Essential hypertension 06/20/2007   LOW BACK PAIN 06/20/2007   DIVERTICULOSIS, COLON 05/05/2007   PCP:  [name], MD Pharmacy:   [id number]  [place], [id number]: [phone]: [phone]    Social Determinants of Health (SDOH) Interventions    Readmission Risk Interventions    09/07/2021   11:26 AM 03/06/2020   10:03 AM 02/13/2020   10:59 AM  Readmission Risk Prevention Plan  Transportation Screening Complete Complete Complete  PCP or Specialist Appt within 3-5 Days  Not Complete   Not Complete comments  Unsure of dc date at this time   [place] Complete   Social Work Consult for [place]/Counseling  Complete   Palliative Care Screening  Not Applicable   Medication Review (Press photographer Complete Complete Complete  PCP or Specialist appointment within 3-5 days of discharge Complete  Complete  HRI or Home Care Consult Complete  Complete  SW Recovery Care/Counseling Consult Complete  Complete  Palliative Care Screening Not Applicable  Complete  [place]  Not Applicable

## 2022-02-07 DIAGNOSIS — C50412 Malignant neoplasm of upper-outer quadrant of left female breast: Secondary | ICD-10-CM

## 2022-02-07 DIAGNOSIS — Z170 Estrogen receptor positive status [ER+]: Secondary | ICD-10-CM

## 2022-02-07 DIAGNOSIS — F1020 Alcohol dependence, uncomplicated: Secondary | ICD-10-CM | POA: Diagnosis not present

## 2022-02-07 DIAGNOSIS — I10 Essential (primary) hypertension: Secondary | ICD-10-CM

## 2022-02-07 DIAGNOSIS — D638 Anemia in other chronic diseases classified elsewhere: Secondary | ICD-10-CM | POA: Diagnosis not present

## 2022-02-07 DIAGNOSIS — G9341 Metabolic encephalopathy: Secondary | ICD-10-CM | POA: Diagnosis not present

## 2022-02-07 DIAGNOSIS — F10931 Alcohol use, unspecified with withdrawal delirium: Secondary | ICD-10-CM | POA: Diagnosis not present

## 2022-02-07 DIAGNOSIS — I5032 Chronic diastolic (congestive) heart failure: Secondary | ICD-10-CM

## 2022-02-07 LAB — BASIC METABOLIC PANEL
Anion gap: 7 (ref 5–15)
BUN: 21 mg/dL (ref 8–23)
CO2: 23 mmol/L (ref 22–32)
Calcium: 9 mg/dL (ref 8.9–10.3)
Chloride: 109 mmol/L (ref 98–111)
Creatinine, Ser: 1.52 mg/dL — ABNORMAL HIGH (ref 0.44–1.00)
GFR, Estimated: 38 mL/min — ABNORMAL LOW (ref >60)
Glucose, Bld: 111 mg/dL — ABNORMAL HIGH (ref 70–99)
Potassium: 4.2 mmol/L (ref 3.5–5.1)
Sodium: 139 mmol/L (ref 135–145)

## 2022-02-07 LAB — MAGNESIUM: Magnesium: 1.5 mg/dL — ABNORMAL LOW (ref 1.7–2.4)

## 2022-02-07 LAB — PHOSPHORUS: Phosphorus: 2.9 mg/dL (ref 2.5–4.6)

## 2022-02-07 MED ORDER — CARVEDILOL 12.5 MG PO TABS
25.0000 mg | ORAL_TABLET | Freq: Two times a day (BID) | ORAL | Status: DC
  Administered 2022-02-07: 25 mg via ORAL
  Filled 2022-02-07: qty 2

## 2022-02-07 MED ORDER — THIAMINE HCL 100 MG PO TABS: 100.0000 mg | ORAL_TABLET | Freq: Every day | ORAL | 0 refills | Status: AC

## 2022-02-07 MED ORDER — HYDRALAZINE HCL 20 MG/ML IJ SOLN
10.0000 mg | Freq: Four times a day (QID) | INTRAMUSCULAR | Status: DC | PRN
Start: 2022-02-07 — End: 2022-02-07
  Filled 2022-02-07: qty 1

## 2022-02-07 MED ORDER — CARVEDILOL 25 MG PO TABS: 25.0000 mg | ORAL_TABLET | Freq: Two times a day (BID) | ORAL | 0 refills | Status: DC

## 2022-02-07 MED ORDER — ADULT MULTIVITAMIN W/MINERALS CH: 1.0000 | ORAL_TABLET | Freq: Every day | ORAL | Status: DC

## 2022-02-07 MED ORDER — MAGNESIUM SULFATE 2 GM/50ML IV SOLN
2.0000 g | Freq: Once | INTRAVENOUS | Status: AC
Start: 2022-02-07 — End: 2022-02-07
  Administered 2022-02-07: 2 g via INTRAVENOUS
  Filled 2022-02-07: qty 50

## 2022-02-07 NOTE — TOC CM/SW Note (Signed)
RNCM attached outpatient and residential substance abuse resources to discharge summary. Patient will need to contact these resources on her own.   No additional TOC needs at this time.

## 2022-02-07 NOTE — Discharge Summary (Signed)
Physician Discharge Summary  [name] IFO:[id number] DOB: 07-19-1956 DOA: 02/04/2022  PCP: [name], MD  Admit date: 02/04/2022 Discharge date: 02/07/2022  Admitted From: Home Disposition:  Home  Recommendations for Outpatient Follow-up:  Follow up with PCP in 1-2 weeks Please obtain BMP/CBC and magnesium in 1 week NO ALCOHOL!!  Home Health: None Equipment/Devices: None Discharge Condition: Stable CODE STATUS: Full code Diet recommendation: Low-sodium diet  Brief/Interim Summary: 66 y.o. female with medical history significant of alcohol abuse with withdrawal seizures, left breast cancer status postmastectomy and radiation, morbid obesity, hypertension, alcoholic liver disease, CKD 3B, chronic diastolic CHF who comes to the hospital after witnessed seizure by the family.  She received Versed per EMS and she was brought to the hospital.   Alcohol abuse alcohol withdrawal seizures: -Last EtOH drink, per patient was 6/7  -Neurology recommended no clear indication for AEDs at this time. -Continued thiamine, multivitamin, folic acid.  Continued CIWA protocol -MRI Brain negative for acute findings.  No seizures during hospitalization. -Consulted PT-recommended no PT follow-up   Acute metabolic encephalopathy -due to #1, EtOH withdrawal and abuse.   -CT scan showed bilateral posterior occipital lobe hypodensities compatible with PCA territory infarcts, subacute to chronic in appearance.  -MRI brain negative.  Evaluated by PT recommended no follow-up. -Her mental status improved significantly.  Chronic kidney disease stage IIIb -Remained stable   Metabolic acidosis-due to seizure, -Resolved   Essential hypertension: -Blood pressure elevated during hospitalization.  Continued amlodipine, increased Coreg dose from 12.5 to 25 mg once daily.  Blood pressure improved changing Coreg dose.   Morbid obesity-BMI 58, she would benefit from weight loss   Chronic diastolic  CHF -no significant volume overload.  Echo in April 2022 showed grade 2 diastolic dysfunction.  Some trace edema but suspect chronic, overall euvolemic today -Continued home Lasix  Liver cirrhosis-seen on prior imaging in 2021.  Surprisingly LFTs were normal on admission.  -Follow-up with PCP outpatient   Chronic anemia-because of EtOH, chronic disease.  H&H remained stable  Hypokalemia: Replenished   Hypomagnesemia: Replenished.  Repeat magnesium level with PCP outpatient  History of breast cancer: Not on any medications at home.  Discharge Diagnoses:  Alcohol abuse Alcohol withdrawal seizures Acute metabolic encephalopathy CKD stage IIIb Metabolic acidosis Essential hypertension Morbid obesity Chronic diastolic CHF Liver cirrhosis Chronic anemia Hypokalemia Hypomagnesemia History of breast cancer   Discharge Instructions  Discharge Instructions     Diet - low sodium heart healthy   Complete by: As directed    Increase activity slowly   Complete by: As directed    No wound care   Complete by: As directed       Allergies as of 02/07/2022   No Known Allergies      Medication List     STOP taking these medications    oxyCODONE-acetaminophen 5-325 MG tablet Commonly known as: PERCOCET/ROXICET       TAKE these medications    albuterol 108 (90 Base) MCG/ACT inhaler Commonly known as: VENTOLIN HFA Inhale 2 puffs into the lungs every 6 (six) hours as needed for wheezing or shortness of breath.   amLODipine 10 MG tablet Commonly known as: NORVASC Take 1 tablet (10 mg total) by mouth daily. What changed: when to take this   BIOTIN PO Take 1 tablet by mouth daily.   carvedilol 25 MG tablet Commonly known as: COREG Take 1 tablet (25 mg total) by mouth 2 (two) times daily with a meal. What changed:  medication strength how  much to take   furosemide 20 MG tablet Commonly known as: LASIX Take 1 tablet (20 mg total) by mouth daily. Please make  overdue appt with Dr. Gasper Sells before anymore refills. Thank you 1st attempt What changed:  when to take this additional instructions   gabapentin 300 MG capsule Commonly known as: NEURONTIN Take 1 capsule (300 mg total) by mouth at bedtime. What changed: when to take this   multivitamin with minerals Tabs tablet Take 1 tablet by mouth daily. Start taking on: February 08, 2022   nystatin powder Commonly known as: MYCOSTATIN/NYSTOP Apply topically 3 (three) times daily. Apply to arms pits, fat folds, inter-trigone What changed:  how much to take when to take this reasons to take this   thiamine 100 MG tablet Take 1 tablet (100 mg total) by mouth daily. Start taking on: February 08, 2022   traMADol 50 MG tablet Commonly known as: ULTRAM Take 50 mg by mouth every 8 (eight) hours as needed (pain).   traZODone 50 MG tablet Commonly known as: DESYREL Take 50 mg by mouth at bedtime.   Vitamin D (Ergocalciferol) 1.25 MG (50000 UNIT) Caps capsule Commonly known as: DRISDOL Take 50,000 Units by mouth every Sunday.        No Known Allergies  Consultations: Neurology   Procedures/Studies: MR BRAIN WO CONTRAST  Result Date: 02/06/2022 CLINICAL DATA:  Mental status change, unknown cause. EXAM: MRI HEAD WITHOUT CONTRAST TECHNIQUE: Multiplanar, multiecho pulse sequences of the brain and surrounding structures were obtained without intravenous contrast. COMPARISON:  Head CT 02/04/2022 and MRI 08/01/2020 FINDINGS: The study is intermittently mildly to moderately motion degraded. Brain: There is no evidence of an acute infarct, mass, midline shift, or extra-axial fluid collection. There are chronic bilateral occipital lobe infarcts with hemosiderin deposition which are new from the prior MRI. Scattered small T2 hyperintensities elsewhere in the cerebral white matter bilaterally are nonspecific but compatible with mild chronic small vessel ischemic disease with assessment for interval  change limited by motion artifact on the prior MRI. Mild generalized cerebral atrophy is within normal limits for age. Vascular: Major intracranial vascular flow voids are preserved. Skull and upper cervical spine: Unremarkable bone marrow signal. Sinuses/Orbits: Unremarkable orbits. Small mucous retention cyst in the left maxillary sinus. Clear mastoid air cells. Other: None. IMPRESSION: 1. No acute intracranial abnormality. 2. Chronic bilateral occipital lobe infarcts. 3. Mild chronic small vessel ischemic disease. Electronically Signed   By: Logan Bores M.D.   On: 02/06/2022 18:40   CT Head Wo Contrast  Result Date: 02/04/2022 CLINICAL DATA:  Acute delirium, history of seizures EXAM: CT HEAD WITHOUT CONTRAST TECHNIQUE: Contiguous axial images were obtained from the base of the skull through the vertex without intravenous contrast. RADIATION DOSE REDUCTION: This exam was performed according to the departmental dose-optimization program which includes automated exposure control, adjustment of the mA and/or kV according to patient size and/or use of iterative reconstruction technique. COMPARISON:  08/31/2021 FINDINGS: Brain: Limited with motion artifact. Similar age related brain atrophy pattern. Bilateral posterior occipital lobe cortical hypodensities, which are new since 08/31/2021 compatible with occipital PCA territory infarcts, subacute to chronic in appearance. No definite acute intracranial hemorrhage, new mass lesion, hydrocephalus, herniation, or large extra-axial fluid collection. Cisterns are patent. No cerebellar abnormality. Vascular: No hyperdense vessel or unexpected calcification. Skull: Normal. Negative for fracture or focal lesion. Sinuses/Orbits: No acute finding. Other: None. IMPRESSION: Limited exam with motion artifact. Interval bilateral posterior occipital lobe hypodensities compatible with PCA territory infarcts, subacute to  chronic in appearance. Stable age related brain atrophy  pattern No acute intracranial hemorrhage. Electronically Signed   By: Jerilynn Mages.  Shick M.D.   On: 02/04/2022 14:55   DG Chest Port 1 View  Result Date: 02/04/2022 CLINICAL DATA:  Altered mental status, history of seizures EXAM: PORTABLE CHEST 1 VIEW COMPARISON:  09/04/2021 FINDINGS: Transverse diameter of heart is increased. There are no signs of pulmonary edema or focal pulmonary consolidation. There is no pleural effusion or pneumothorax. Tip of right chest port is seen in the superior vena cava close to the right atrium. Surgical clips are seen in the left axilla and left chest wall. IMPRESSION: Cardiomegaly. There are no signs of pulmonary edema or focal pulmonary consolidation. Electronically Signed   By: Elmer Picker M.D.   On: 02/04/2022 13:45      Subjective: Patient seen and examined.  Resting comfortably on the bed.  Denies any complaints.  Comfortable going home today.  No acute events overnight.  No seizures since hospitalization.  I talked to patient's son on the phone: Patient's granddaughter lives with the patient.  We discussed about discharge plan and strongly recommend to avoid alcohol and he verbalized understanding  Discharge Exam: Vitals:   02/07/22 0812 02/07/22 1012  BP:  (!) 113/97  Pulse:    Resp:    Temp: 98.3 F (36.8 C)   SpO2:     Vitals:   02/07/22 0635 02/07/22 0807 02/07/22 0812 02/07/22 1012  BP: (!) 167/68 (!) 160/80  (!) 113/97  Pulse: 87 86    Resp: (!) 29 (!) 21    Temp:   98.3 F (36.8 C)   TempSrc:   Oral   SpO2: 98% 97%    Weight:      Height:        General: Pt is alert, awake, not in acute distress, obese, on room air, communicating well Cardiovascular: RRR, S1/S2 +, no rubs, no gallops Respiratory: CTA bilaterally, no wheezing, no rhonchi Abdominal: Soft, NT, ND, bowel sounds + Extremities: no edema, no cyanosis    The results of significant diagnostics from this hospitalization (including imaging, microbiology, ancillary and  laboratory) are listed below for reference.     Microbiology: Recent Results (from the past 240 hour(s))  MRSA Next Gen by PCR, Nasal     Status: None   Collection Time: 02/04/22  5:56 PM   Specimen: Nasal Mucosa; Nasal Swab  Result Value Ref Range Status   MRSA by PCR Next Gen NOT DETECTED NOT DETECTED Final    Comment: (NOTE) The GeneXpert MRSA Assay (FDA approved for NASAL specimens only), is one component of a comprehensive MRSA colonization surveillance program. It is not intended to diagnose MRSA infection nor to guide or monitor treatment for MRSA infections. Test performance is not FDA approved in patients less than 49 years old. Performed at [facility], [place] [address], [place], [place] [address]      Labs: BNP (last 3 results) No results for input(s): "BNP" in the last 8760 hours. Basic Metabolic Panel: Recent Labs  Lab 02/04/22 1344 02/05/22 0320 02/06/22 0536 02/07/22 0443  NA 144 143 142 139  K 3.5 3.4* 3.2* 4.2  CL 112* 111 111 109  CO2 17* 21* 23 23  GLUCOSE 73 89 118* 111*  BUN 33* 33* 26* 21  CREATININE 1.78* 1.79* 1.53* 1.52*  CALCIUM 8.6* 8.3* 9.0 9.0  MG  --   --  1.5* 1.5*  PHOS  --   --  2.8 2.9   Liver Function Tests: Recent Labs  Lab 02/04/22 1344 02/05/22 0320 02/06/22 0536  AST '20 18 15  '$ ALT '11 11 9  '$ ALKPHOS 47 42 42  BILITOT 0.6 1.3* 1.1  PROT 7.9 6.9 7.2  ALBUMIN 3.7 3.2* 3.3*   No results for input(s): "LIPASE", "AMYLASE" in the last 168 hours. No results for input(s): "AMMONIA" in the last 168 hours. CBC: Recent Labs  Lab 02/04/22 1344 02/05/22 0320 02/06/22 0536  WBC 7.3 6.4 5.2  NEUTROABS 5.5  --  2.9  HGB 9.9* 8.8* 9.4*  HCT 31.2* 26.8* 29.0*  MCV 96.0 92.7 92.9  PLT 189 193 176   Cardiac Enzymes: No results for input(s): "CKTOTAL", "CKMB", "CKMBINDEX", "TROPONINI" in the last 168 hours. BNP: Invalid input(s): "POCBNP" CBG: No results for input(s): "GLUCAP" in the last 168  hours. D-Dimer No results for input(s): "DDIMER" in the last 72 hours. Hgb A1c No results for input(s): "HGBA1C" in the last 72 hours. Lipid Profile No results for input(s): "CHOL", "HDL", "LDLCALC", "TRIG", "CHOLHDL", "LDLDIRECT" in the last 72 hours. Thyroid function studies No results for input(s): "TSH", "T4TOTAL", "T3FREE", "THYROIDAB" in the last 72 hours.  Invalid input(s): "FREET3" Anemia work up No results for input(s): "VITAMINB12", "FOLATE", "FERRITIN", "TIBC", "IRON", "RETICCTPCT" in the last 72 hours. Urinalysis    Component Value Date/Time   COLORURINE YELLOW 08/31/2021 0044   APPEARANCEUR HAZY (A) 08/31/2021 0044   LABSPEC 1.009 08/31/2021 0044   PHURINE 6.0 08/31/2021 0044   GLUCOSEU NEGATIVE 08/31/2021 0044   HGBUR SMALL (A) 08/31/2021 0044   BILIRUBINUR NEGATIVE 08/31/2021 0044   KETONESUR NEGATIVE 08/31/2021 0044   PROTEINUR 100 (A) 08/31/2021 0044   UROBILINOGEN 1.0 07/14/2012 1630   NITRITE POSITIVE (A) 08/31/2021 0044   LEUKOCYTESUR MODERATE (A) 08/31/2021 0044   Sepsis Labs Recent Labs  Lab 02/04/22 1344 02/05/22 0320 02/06/22 0536  WBC 7.3 6.4 5.2   Microbiology Recent Results (from the past 240 hour(s))  MRSA Next Gen by PCR, Nasal     Status: None   Collection Time: 02/04/22  5:56 PM   Specimen: Nasal Mucosa; Nasal Swab  Result Value Ref Range Status   MRSA by PCR Next Gen NOT DETECTED NOT DETECTED Final    Comment: (NOTE) The GeneXpert MRSA Assay (FDA approved for NASAL specimens only), is one component of a comprehensive MRSA colonization surveillance program. It is not intended to diagnose MRSA infection nor to guide or monitor treatment for MRSA infections. Test performance is not FDA approved in patients less than 98 years old. Performed at [facility], [place] [address], [place], [place] [address]      Time coordinating discharge: Over 30 minutes  SIGNED:   Mckinley Jewel, MD  [facility] 02/07/2022, 11:55 AM Pager   If 7PM-7AM, please contact night-coverage [email or web address]

## 2022-03-23 NOTE — Telephone Encounter (Signed)
error 

## 2022-04-02 ENCOUNTER — Encounter (HOSPITAL_COMMUNITY): Payer: Self-pay

## 2022-04-02 ENCOUNTER — Emergency Department (HOSPITAL_BASED_OUTPATIENT_CLINIC_OR_DEPARTMENT_OTHER): Payer: Medicare Other

## 2022-04-02 ENCOUNTER — Emergency Department (HOSPITAL_COMMUNITY)
Admission: EM | Admit: 2022-04-02 | Discharge: 2022-04-03 | Disposition: A | Discharge status: Home / Self Care | Payer: Medicare Other | Source: Home / Self Care | Attending: Emergency Medicine | Admitting: Emergency Medicine

## 2022-04-02 DIAGNOSIS — M7989 Other specified soft tissue disorders: Secondary | ICD-10-CM | POA: Insufficient documentation

## 2022-04-02 DIAGNOSIS — Z853 Personal history of malignant neoplasm of breast: Secondary | ICD-10-CM | POA: Insufficient documentation

## 2022-04-02 DIAGNOSIS — R4182 Altered mental status, unspecified: Secondary | ICD-10-CM | POA: Diagnosis not present

## 2022-04-02 DIAGNOSIS — F10239 Alcohol dependence with withdrawal, unspecified: Secondary | ICD-10-CM | POA: Diagnosis not present

## 2022-04-02 DIAGNOSIS — R569 Unspecified convulsions: Secondary | ICD-10-CM | POA: Diagnosis not present

## 2022-04-02 DIAGNOSIS — F10939 Alcohol use, unspecified with withdrawal, unspecified: Secondary | ICD-10-CM | POA: Diagnosis not present

## 2022-04-02 LAB — BASIC METABOLIC PANEL
Anion gap: 7 (ref 5–15)
BUN: 30 mg/dL — ABNORMAL HIGH (ref 8–23)
CO2: 27 mmol/L (ref 22–32)
Calcium: 9.4 mg/dL (ref 8.9–10.3)
Chloride: 107 mmol/L (ref 98–111)
Creatinine, Ser: 1.59 mg/dL — ABNORMAL HIGH (ref 0.44–1.00)
GFR, Estimated: 36 mL/min — ABNORMAL LOW (ref >60)
Glucose, Bld: 111 mg/dL — ABNORMAL HIGH (ref 70–99)
Potassium: 3.8 mmol/L (ref 3.5–5.1)
Sodium: 141 mmol/L (ref 135–145)

## 2022-04-02 LAB — CBC
HCT: 34.3 % — ABNORMAL LOW (ref 36.0–46.0)
Hemoglobin: 10.8 g/dL — ABNORMAL LOW (ref 12.0–15.0)
MCH: 29.6 pg (ref 26.0–34.0)
MCHC: 31.5 g/dL (ref 30.0–36.0)
MCV: 94 fL (ref 80.0–100.0)
Platelets: 198 10*3/uL (ref 150–400)
RBC: 3.65 MIL/uL — ABNORMAL LOW (ref 3.87–5.11)
RDW: 13.3 % (ref 11.5–15.5)
WBC: 4.5 10*3/uL (ref 4.0–10.5)
nRBC: 0 % (ref 0.0–0.2)

## 2022-04-02 NOTE — Progress Notes (Signed)
LUE venous duplex has been completed.  Preliminary results given to Evlyn Courier, PA-C   Results can be found under chart review under CV PROC. 04/02/2022 4:50 PM Emmali Karow RVT, RDMS

## 2022-04-02 NOTE — ED Provider Triage Note (Signed)
Emergency Medicine Provider Triage Evaluation Note  [name] , a 66 y.o. female  was evaluated in triage.  Pt complains of left upper extremity swelling.  History of mastectomy on the left side.  Concern for lymphedema however since this is new onset swelling PCP wanted patient to be evaluated for DVT.  Patient denies chest pain, shortness of breath, recent travel, history of blood clots.  Review of Systems  Positive: As above Negative: As above  Physical Exam  BP (!) 160/91 (BP Location: Right Arm)   Pulse 70   Temp 98.5 F (36.9 C) (Oral)   Resp 18   Ht '5\' 1"'$  (1.549 m)   Wt (!) 137.9 kg   SpO2 99%   BMI 57.44 kg/m  Gen:   Awake, no distress   Resp:  Normal effort  MSK:   Moves extremities without difficulty  Other:  Diffuse left upper extremity swelling noted  Medical Decision Making  Medically screening exam initiated at 3:39 PM.  Appropriate orders placed.  [name] was informed that the remainder of the evaluation will be completed by another provider, this initial triage assessment does not replace that evaluation, and the importance of remaining in the ED until their evaluation is complete.     Evlyn Courier, PA-C 04/02/22 1540

## 2022-04-02 NOTE — ED Triage Notes (Signed)
Pt sent over by PCP to rule out DVT. Reports new onset left upper limb swelling and pain.   5/10  Lymphedema due to history of left mastectomy for breast cancer.   Pt in wheelchair during triage  A/Ox4

## 2022-04-02 NOTE — ED Provider Notes (Signed)
[name] DEPT Provider Note   CSN: [id number] Arrival date & time: 04/02/22  1454     History {Add pertinent medical, surgical, social history, OB history to HPI:1} Chief Complaint  Patient presents with   Arm Swelling    [name] is a 66 y.o. female.  Patient is a 66 year old female with past medical history of breast cancer on the left side with left-sided mastectomy presenting for complaints of left upper extremity swelling.  Admits to swelling from the fingers to the shoulder.  Denies any pain.  Denies any redness, wounds, bleeding, or history of fall or trauma.  Prior history of DVT or PE.  Patient is not anticoagulated at this time.  The history is provided by the patient. No language interpreter was used.       Home Medications Prior to Admission medications   Medication Sig Start Date End Date Taking? Authorizing Provider  albuterol (VENTOLIN HFA) 108 (90 Base) MCG/ACT inhaler Inhale 2 puffs into the lungs every 6 (six) hours as needed for wheezing or shortness of breath. Patient not taking: Reported on 02/05/2022 09/12/21 09/12/22  [name], Corrie Mckusick, MD  amLODipine (NORVASC) 10 MG tablet Take 1 tablet (10 mg total) by mouth daily. Patient taking differently: Take 10 mg by mouth every morning. 09/12/21   [name], [name], MD  BIOTIN PO Take 1 tablet by mouth daily.    [provider]  carvedilol (COREG) 25 MG tablet Take 1 tablet (25 mg total) by mouth 2 (two) times daily with a meal. 02/07/22 03/09/22  Pahwani, Michell Heinrich, MD  furosemide (LASIX) 20 MG tablet Take 1 tablet (20 mg total) by mouth daily. Please make overdue appt with Dr. Gasper Sells before anymore refills. Thank you 1st attempt Patient taking differently: Take 20 mg by mouth every morning. 06/03/21   Chandrasekhar, Lyda Kalata A, MD  gabapentin (NEURONTIN) 300 MG capsule Take 1 capsule (300 mg total) by mouth at bedtime. Patient taking differently: Take 300 mg by mouth 2 (two)  times daily. 07/14/20   Magrinat, Virgie Dad, MD  Multiple Vitamin (MULTIVITAMIN WITH MINERALS) TABS tablet Take 1 tablet by mouth daily. 02/08/22   Pahwani, Michell Heinrich, MD  nystatin (MYCOSTATIN/NYSTOP) powder Apply topically 3 (three) times daily. Apply to arms pits, fat folds, inter-trigone Patient taking differently: Apply 1 application  topically 3 (three) times daily as needed (itching/rash). Apply to arms pits, fat folds, inter-trigone 09/12/21   [name], [name], MD  traMADol (ULTRAM) 50 MG tablet Take 50 mg by mouth every 8 (eight) hours as needed (pain). 12/10/21   [provider]  traZODone (DESYREL) 50 MG tablet Take 50 mg by mouth at bedtime. 01/18/22   [provider]  Vitamin D, Ergocalciferol, (DRISDOL) 1.25 MG (50000 UNIT) CAPS capsule Take 50,000 Units by mouth every Sunday. 10/30/21   [provider]      Allergies    Patient has no known allergies.    Review of Systems   Review of Systems  Constitutional:  Negative for chills and fever.  HENT:  Negative for ear pain and sore throat.   Eyes:  Negative for pain and visual disturbance.  Respiratory:  Negative for cough and shortness of breath.   Cardiovascular:  Negative for chest pain and palpitations.  Gastrointestinal:  Negative for abdominal pain and vomiting.  Genitourinary:  Negative for dysuria and hematuria.  Musculoskeletal:  Negative for arthralgias and back pain.  Skin:  Negative for color change and rash.  Neurological:  Negative for seizures  and syncope.  All other systems reviewed and are negative.   Physical Exam Updated Vital Signs BP (!) 154/101   Pulse 70   Temp 97.7 F (36.5 C) (Oral)   Resp 18   Ht '5\' 1"'$  (1.549 m)   Wt (!) 137.9 kg   SpO2 100%   BMI 57.44 kg/m  Physical Exam Vitals and nursing note reviewed.  Constitutional:      General: She is not in acute distress.    Appearance: She is well-developed.  HENT:     Head: Normocephalic and atraumatic.  Eyes:      Conjunctiva/sclera: Conjunctivae normal.  Cardiovascular:     Rate and Rhythm: Normal rate and regular rhythm.     Heart sounds: No murmur heard. Pulmonary:     Effort: Pulmonary effort is normal. No respiratory distress.     Breath sounds: Normal breath sounds.  Abdominal:     Palpations: Abdomen is soft.     Tenderness: There is no abdominal tenderness.  Musculoskeletal:        General: No swelling.     Cervical back: Neck supple.     Comments: Patient has swelling in the left fingers, hand, upper extremity went to the shoulder.  No tender lymphadenopathy present.  Arm is neurovascular intact with normal sensation, motor function, and +2 radial pulse.  Skin:    General: Skin is warm and dry.     Capillary Refill: Capillary refill takes less than 2 seconds.  Neurological:     Mental Status: She is alert.  Psychiatric:        Mood and Affect: Mood normal.     ED Results / Procedures / Treatments   Labs (all labs ordered are listed, but only abnormal results are displayed) Labs Reviewed  CBC - Abnormal; Notable for the following components:      Result Value   RBC 3.65 (*)    Hemoglobin 10.8 (*)    HCT 34.3 (*)    All other components within normal limits  BASIC METABOLIC PANEL - Abnormal; Notable for the following components:   Glucose, Bld 111 (*)    BUN 30 (*)    Creatinine, Ser 1.59 (*)    GFR, Estimated 36 (*)    All other components within normal limits    EKG None  Radiology UE VENOUS DUPLEX (7am - 7pm)  Result Date: 04/02/2022 UPPER VENOUS STUDY  Patient Name:  [name]  Date of Exam:   04/02/2022 Medical Rec #: [id number]      Accession #:    [id number] Date of Birth: 08-24-1956     Patient Gender: F Patient Age:   35 years Exam Location:  [facility] Procedure:      VAS [place] UPPER EXTREMITY VENOUS DUPLEX Referring Phys: AMJAD ALI --------------------------------------------------------------------------------  Indications: Swelling Risk Factors: HX of  left sided mastectomy. Limitations: Body habitus, poor ultrasound/tissue interface and scanning conditions (patient in wheelchair). Comparison Study: Previous exam on 05/31/20 was negative for DVT Performing Technologist: Rogelia Rohrer RVT, RDMS  Examination Guidelines: A complete evaluation includes B-mode imaging, spectral Doppler, color Doppler, and power Doppler as needed of all accessible portions of each vessel. Bilateral testing is considered an integral part of a complete examination. Limited examinations for reoccurring indications may be performed as noted.  Right Findings: +----------+------------+---------+-----------+----------+--------------------+ RIGHT     CompressiblePhasicitySpontaneousProperties      Summary        +----------+------------+---------+-----------+----------+--------------------+ Subclavian  Yes       Yes                   patent by                                                              color/doppler     +----------+------------+---------+-----------+----------+--------------------+  Left Findings: +----------+------------+---------+-----------+----------+--------------------+ LEFT      CompressiblePhasicitySpontaneousProperties      Summary        +----------+------------+---------+-----------+----------+--------------------+ IJV           Full       Yes       Yes                                   +----------+------------+---------+-----------+----------+--------------------+ Subclavian               Yes       Yes                   patent by                                                              color/doppler     +----------+------------+---------+-----------+----------+--------------------+ Axillary      Full       Yes       Yes                                   +----------+------------+---------+-----------+----------+--------------------+ Brachial      Full       Yes       Yes                                    +----------+------------+---------+-----------+----------+--------------------+ Radial        Full                                                       +----------+------------+---------+-----------+----------+--------------------+ Ulnar         Full                                                       +----------+------------+---------+-----------+----------+--------------------+ Cephalic      Full       Yes       Yes                                   +----------+------------+---------+-----------+----------+--------------------+ Basilic       Full  Yes       Yes                                   +----------+------------+---------+-----------+----------+--------------------+ Only very proximal portion of brachial veins visualized due to habitus  Summary:  Right: No evidence of thrombosis in the subclavian.  Left: No evidence of deep vein thrombosis in the upper extremity. No evidence of superficial vein thrombosis in the upper extremity. Diffuse subcutaneous edema in LUE.  *See table(s) above for measurements and observations.  Diagnosing physician: Deitra Mayo MD Electronically signed by Deitra Mayo MD on 04/02/2022 at 7:28:26 PM.    Final     Procedures Procedures  {Document cardiac monitor, telemetry assessment procedure when appropriate:1}  Medications Ordered in ED Medications - No data to display  ED Course/ Medical Decision Making/ A&P                           Medical Decision Making 66 year old female with past medical history of breast cancer on the left side with left-sided mastectomy presenting for complaints of left upper extremity swelling.  Exam patient is alert and oriented x3, no acute distress, afebrile, stable vital signs.  Exam demonstrates swelling in the left fingers, hand, upper extremity went to the shoulder.  No tender lymphadenopathy present.  Arm is neurovascular intact with normal sensation, motor function, and +2  radial pulse.  Left upper extremity ultrasound demonstrates no DVT.  {Document critical care time when appropriate:1} {Document review of labs and clinical decision tools ie heart score, Chads2Vasc2 etc:1}  {Document your independent review of radiology images, and any outside records:1} {Document your discussion with family members, caretakers, and with consultants:1} {Document social determinants of health affecting pt's care:1} {Document your decision making why or why not admission, treatments were needed:1} Final Clinical Impression(s) / ED Diagnoses Final diagnoses:  None    Rx / DC Orders ED Discharge Orders     None

## 2022-04-03 ENCOUNTER — Encounter (HOSPITAL_COMMUNITY): Payer: Self-pay

## 2022-04-03 ENCOUNTER — Emergency Department (HOSPITAL_COMMUNITY): Payer: Medicare Other

## 2022-04-03 MED ORDER — IOHEXOL 300 MG/ML  SOLN
80.0000 mL | Freq: Once | INTRAMUSCULAR | Status: AC | PRN
  Administered 2022-04-03: 80 mL via INTRAVENOUS

## 2022-04-03 NOTE — ED Provider Notes (Signed)
CT chest did not reveal any acute findings.  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 Patient denies any complaints.  No chest pain or shortness of breath.  She reports the swelling is improved.  She is able move the arm without difficulty, she has distal pulses We will DC home.  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

## 2022-04-03 NOTE — Discharge Instructions (Signed)
Keep the arm elevated.  If you have any new chest pain, shortness of breath the next 2-3 days, please return to the ER Please follow-up with Wilber Bihari soon for recheck of your port

## 2022-04-04 ENCOUNTER — Emergency Department (HOSPITAL_COMMUNITY): Payer: Medicare Other

## 2022-04-04 ENCOUNTER — Inpatient Hospital Stay (HOSPITAL_COMMUNITY)
Admission: EM | Admit: 2022-04-04 | Discharge: 2022-04-06 | DRG: 897 | Disposition: A | Discharge status: Home Health | Payer: Medicare Other | Attending: Family Medicine | Admitting: Family Medicine

## 2022-04-04 DIAGNOSIS — R569 Unspecified convulsions: Secondary | ICD-10-CM

## 2022-04-04 DIAGNOSIS — Z9012 Acquired absence of left breast and nipple: Secondary | ICD-10-CM

## 2022-04-04 DIAGNOSIS — N1831 Chronic kidney disease, stage 3a: Secondary | ICD-10-CM | POA: Diagnosis present

## 2022-04-04 DIAGNOSIS — D631 Anemia in chronic kidney disease: Secondary | ICD-10-CM | POA: Diagnosis present

## 2022-04-04 DIAGNOSIS — F10939 Alcohol use, unspecified with withdrawal, unspecified: Secondary | ICD-10-CM

## 2022-04-04 DIAGNOSIS — Z853 Personal history of malignant neoplasm of breast: Secondary | ICD-10-CM

## 2022-04-04 DIAGNOSIS — I700 Atherosclerosis of aorta: Secondary | ICD-10-CM | POA: Diagnosis present

## 2022-04-04 DIAGNOSIS — Z86718 Personal history of other venous thrombosis and embolism: Secondary | ICD-10-CM

## 2022-04-04 DIAGNOSIS — F10239 Alcohol dependence with withdrawal, unspecified: Principal | ICD-10-CM | POA: Diagnosis present

## 2022-04-04 DIAGNOSIS — I5032 Chronic diastolic (congestive) heart failure: Secondary | ICD-10-CM | POA: Diagnosis present

## 2022-04-04 DIAGNOSIS — N179 Acute kidney failure, unspecified: Secondary | ICD-10-CM | POA: Diagnosis present

## 2022-04-04 DIAGNOSIS — K7460 Unspecified cirrhosis of liver: Secondary | ICD-10-CM | POA: Diagnosis present

## 2022-04-04 DIAGNOSIS — N390 Urinary tract infection, site not specified: Secondary | ICD-10-CM | POA: Diagnosis present

## 2022-04-04 DIAGNOSIS — Z781 Physical restraint status: Secondary | ICD-10-CM

## 2022-04-04 DIAGNOSIS — Z8616 Personal history of COVID-19: Secondary | ICD-10-CM

## 2022-04-04 DIAGNOSIS — Z635 Disruption of family by separation and divorce: Secondary | ICD-10-CM

## 2022-04-04 DIAGNOSIS — Z170 Estrogen receptor positive status [ER+]: Secondary | ICD-10-CM

## 2022-04-04 DIAGNOSIS — Z8249 Family history of ischemic heart disease and other diseases of the circulatory system: Secondary | ICD-10-CM

## 2022-04-04 DIAGNOSIS — E876 Hypokalemia: Secondary | ICD-10-CM | POA: Diagnosis present

## 2022-04-04 DIAGNOSIS — Z6841 Body Mass Index (BMI) 40.0 and over, adult: Secondary | ICD-10-CM

## 2022-04-04 DIAGNOSIS — I871 Compression of vein: Secondary | ICD-10-CM | POA: Diagnosis present

## 2022-04-04 DIAGNOSIS — F1020 Alcohol dependence, uncomplicated: Secondary | ICD-10-CM | POA: Diagnosis present

## 2022-04-04 DIAGNOSIS — Z833 Family history of diabetes mellitus: Secondary | ICD-10-CM

## 2022-04-04 DIAGNOSIS — I130 Hypertensive heart and chronic kidney disease with heart failure and stage 1 through stage 4 chronic kidney disease, or unspecified chronic kidney disease: Secondary | ICD-10-CM | POA: Diagnosis present

## 2022-04-04 DIAGNOSIS — Z823 Family history of stroke: Secondary | ICD-10-CM

## 2022-04-04 DIAGNOSIS — G4089 Other seizures: Secondary | ICD-10-CM | POA: Diagnosis present

## 2022-04-04 DIAGNOSIS — Z8041 Family history of malignant neoplasm of ovary: Secondary | ICD-10-CM

## 2022-04-04 DIAGNOSIS — Z79899 Other long term (current) drug therapy: Secondary | ICD-10-CM

## 2022-04-04 LAB — COMPREHENSIVE METABOLIC PANEL
ALT: 13 U/L (ref 0–44)
AST: 18 U/L (ref 15–41)
Albumin: 3.8 g/dL (ref 3.5–5.0)
Alkaline Phosphatase: 53 U/L (ref 38–126)
Anion gap: 11 (ref 5–15)
BUN: 22 mg/dL (ref 8–23)
CO2: 22 mmol/L (ref 22–32)
Calcium: 9.2 mg/dL (ref 8.9–10.3)
Chloride: 104 mmol/L (ref 98–111)
Creatinine, Ser: 1.8 mg/dL — ABNORMAL HIGH (ref 0.44–1.00)
GFR, Estimated: 31 mL/min — ABNORMAL LOW (ref >60)
Glucose, Bld: 155 mg/dL — ABNORMAL HIGH (ref 70–99)
Potassium: 4 mmol/L (ref 3.5–5.1)
Sodium: 137 mmol/L (ref 135–145)
Total Bilirubin: 0.7 mg/dL (ref 0.3–1.2)
Total Protein: 8.1 g/dL (ref 6.5–8.1)

## 2022-04-04 LAB — CBC
HCT: 33.3 % — ABNORMAL LOW (ref 36.0–46.0)
Hemoglobin: 10.9 g/dL — ABNORMAL LOW (ref 12.0–15.0)
MCH: 29.8 pg (ref 26.0–34.0)
MCHC: 32.7 g/dL (ref 30.0–36.0)
MCV: 91 fL (ref 80.0–100.0)
Platelets: 193 10*3/uL (ref 150–400)
RBC: 3.66 MIL/uL — ABNORMAL LOW (ref 3.87–5.11)
RDW: 12.9 % (ref 11.5–15.5)
WBC: 7.2 10*3/uL (ref 4.0–10.5)
nRBC: 0 % (ref 0.0–0.2)

## 2022-04-04 LAB — DIFFERENTIAL
Abs Immature Granulocytes: 0.03 10*3/uL (ref 0.00–0.07)
Basophils Absolute: 0 10*3/uL (ref 0.0–0.1)
Basophils Relative: 0 %
Eosinophils Absolute: 0 10*3/uL (ref 0.0–0.5)
Eosinophils Relative: 0 %
Immature Granulocytes: 0 %
Lymphocytes Relative: 17 %
Lymphs Abs: 1.2 10*3/uL (ref 0.7–4.0)
Monocytes Absolute: 0.3 10*3/uL (ref 0.1–1.0)
Monocytes Relative: 4 %
Neutro Abs: 5.7 10*3/uL (ref 1.7–7.7)
Neutrophils Relative %: 79 %

## 2022-04-04 LAB — APTT: aPTT: 27 seconds (ref 24–36)

## 2022-04-04 LAB — I-STAT CHEM 8, ED
BUN: 24 mg/dL — ABNORMAL HIGH (ref 8–23)
Calcium, Ion: 1.1 mmol/L — ABNORMAL LOW (ref 1.15–1.40)
Chloride: 104 mmol/L (ref 98–111)
Creatinine, Ser: 1.8 mg/dL — ABNORMAL HIGH (ref 0.44–1.00)
Glucose, Bld: 146 mg/dL — ABNORMAL HIGH (ref 70–99)
HCT: 35 % — ABNORMAL LOW (ref 36.0–46.0)
Hemoglobin: 11.9 g/dL — ABNORMAL LOW (ref 12.0–15.0)
Potassium: 4 mmol/L (ref 3.5–5.1)
Sodium: 138 mmol/L (ref 135–145)
TCO2: 23 mmol/L (ref 22–32)

## 2022-04-04 LAB — PROTIME-INR
INR: 1.1 (ref 0.8–1.2)
Prothrombin Time: 14.2 seconds (ref 11.4–15.2)

## 2022-04-04 LAB — ETHANOL: Alcohol, Ethyl (B): <10 mg/dL (ref <10)

## 2022-04-04 MED ORDER — FENTANYL CITRATE PF 50 MCG/ML IJ SOSY
PREFILLED_SYRINGE | INTRAMUSCULAR | Status: AC
  Administered 2022-04-04: 50 ug
  Filled 2022-04-04: qty 1

## 2022-04-04 MED ORDER — LEVETIRACETAM IN NACL 1000 MG/100ML IV SOLN
1000.0000 mg | Freq: Once | INTRAVENOUS | Status: AC
  Administered 2022-04-04: 1000 mg via INTRAVENOUS
  Filled 2022-04-04: qty 100

## 2022-04-04 MED ORDER — LORAZEPAM 2 MG/ML IJ SOLN
1.0000 mg | Freq: Once | INTRAMUSCULAR | Status: AC
  Administered 2022-04-04: 1 mg via INTRAVENOUS
  Filled 2022-04-04: qty 1

## 2022-04-04 MED ORDER — SODIUM CHLORIDE 0.9 % IV SOLN: 2000.0000 mg | Freq: Once | INTRAVENOUS | Status: DC

## 2022-04-04 MED ORDER — SODIUM CHLORIDE 0.9% FLUSH: 3.0000 mL | Freq: Once | INTRAVENOUS | Status: DC

## 2022-04-04 MED ORDER — LORAZEPAM 2 MG/ML IJ SOLN
2.0000 mg | Freq: Once | INTRAMUSCULAR | Status: DC
  Administered 2022-04-04: 2 mg via INTRAVENOUS
  Filled 2022-04-04: qty 1

## 2022-04-04 MED ORDER — LORAZEPAM 2 MG/ML IJ SOLN
2.0000 mg | Freq: Once | INTRAMUSCULAR | Status: AC
  Administered 2022-04-04: 2 mg via INTRAMUSCULAR
  Filled 2022-04-04: qty 1

## 2022-04-04 MED ORDER — SODIUM CHLORIDE 0.9 % IV BOLUS
1000.0000 mL | Freq: Once | INTRAVENOUS | Status: AC
  Administered 2022-04-04: 1000 mL via INTRAVENOUS

## 2022-04-04 NOTE — ED Notes (Signed)
R IJ CVC Triple lumen placed by Darl Householder MD. Odette Horns called for portable  chest to confirm placement. Soft wrist restraints applied as pt immediately tried to grab central line.

## 2022-04-04 NOTE — Code Documentation (Signed)
Responded to Code Stroke called at 2111 for AMS and R sided gaze, LSN-1500. Pt arrived at 2116, agitated, confused, and combative. CBG-155, NIH-7. Unable to obtain CT head d/t agitation despite pt receiving 98mg fentanyl IV. TNK not given-no focal deficits appreciated/low suspicion for stroke. Plan for EEG.

## 2022-04-04 NOTE — ED Notes (Signed)
Upon return to room 17 pt trying to pull out PIV - VO from Darl Householder MD to give '1mg'$  ativan IV.

## 2022-04-04 NOTE — ED Notes (Signed)
Ultrasound IV attempts unsuccessful, VO Yao MD to set up for central line and to give '2mg'$  ativan IM

## 2022-04-04 NOTE — ED Provider Notes (Signed)
[facility] EMERGENCY DEPARTMENT Provider Note   CSN: [id number] Arrival date & time: 04/04/22  2116     History  No chief complaint on file.   [name] is a 66 y.o. female history of alcohol abuse with alcohol withdrawal seizures here presenting with possible seizure.  Patient was seen in the ED 2 days ago for left upper extremity swelling.  Patient had a CT of the chest that showed chronic thrombosis of her port.  Patient apparently went back home and drank some alcohol.  She drinks unknown amount of alcohol.  She appears to be hangover this morning.  Family went to grocery shop and found her twitching and having seizure-like activity.  Patient has a history of alcohol withdrawal seizures and was admitted 74-monthago for similar symptoms. Patient came in as a code stroke because she has eye deviation to the left side.  Neurology saw patient and patient seem to be encephalopathic.  The history is provided by the EMS personnel.       Home Medications Prior to Admission medications   Medication Sig Start Date End Date Taking? Authorizing Provider  albuterol (VENTOLIN HFA) 108 (90 Base) MCG/ACT inhaler Inhale 2 puffs into the lungs every 6 (six) hours as needed for wheezing or shortness of breath. Patient not taking: Reported on 02/05/2022 09/12/21 09/12/22  [name], LCorrie Mckusick MD  amLODipine (NORVASC) 10 MG tablet Take 1 tablet (10 mg total) by mouth daily. Patient taking differently: Take 10 mg by mouth every morning. 09/12/21   [name], [name], MD  BIOTIN PO Take 1 tablet by mouth daily.    [provider]  carvedilol (COREG) 25 MG tablet Take 1 tablet (25 mg total) by mouth 2 (two) times daily with a meal. 02/07/22 03/09/22  Pahwani, RMichell Heinrich MD  furosemide (LASIX) 20 MG tablet Take 1 tablet (20 mg total) by mouth daily. Please make overdue appt with Dr. CGasper Sellsbefore anymore refills. Thank you 1st attempt Patient taking differently: Take 20 mg by mouth  every morning. 06/03/21   Chandrasekhar, MLyda KalataA, MD  gabapentin (NEURONTIN) 300 MG capsule Take 1 capsule (300 mg total) by mouth at bedtime. Patient taking differently: Take 300 mg by mouth 2 (two) times daily. 07/14/20   Magrinat, GVirgie Dad MD  Multiple Vitamin (MULTIVITAMIN WITH MINERALS) TABS tablet Take 1 tablet by mouth daily. 02/08/22   Pahwani, RMichell Heinrich MD  nystatin (MYCOSTATIN/NYSTOP) powder Apply topically 3 (three) times daily. Apply to arms pits, fat folds, inter-trigone Patient taking differently: Apply 1 application  topically 3 (three) times daily as needed (itching/rash). Apply to arms pits, fat folds, inter-trigone 09/12/21   [name], [name], MD  traMADol (ULTRAM) 50 MG tablet Take 50 mg by mouth every 8 (eight) hours as needed (pain). 12/10/21   [provider]  traZODone (DESYREL) 50 MG tablet Take 50 mg by mouth at bedtime. 01/18/22   [provider]  Vitamin D, Ergocalciferol, (DRISDOL) 1.25 MG (50000 UNIT) CAPS capsule Take 50,000 Units by mouth every Sunday. 10/30/21   [provider]      Allergies    Patient has no known allergies.    Review of Systems   Review of Systems  Neurological:  Positive for seizures.  All other systems reviewed and are negative.   Physical Exam Updated Vital Signs Wt 135.8 kg   BMI 56.57 kg/m  Physical Exam Vitals and nursing note reviewed.  Constitutional:      Comments: Patient is altered and agitated.  No eye deviation currently.  Unable to give history  HENT:     Head: Normocephalic.     Nose: Nose normal.     Mouth/Throat:     Mouth: Mucous membranes are dry.  Eyes:     Extraocular Movements: Extraocular movements intact.     Pupils: Pupils are equal, round, and reactive to light.  Cardiovascular:     Rate and Rhythm: Normal rate and regular rhythm.     Pulses: Normal pulses.     Heart sounds: Normal heart sounds.  Pulmonary:     Effort: Pulmonary effort is normal.     Breath sounds: Normal  breath sounds.  Abdominal:     General: Abdomen is flat.     Palpations: Abdomen is soft.  Musculoskeletal:        General: Normal range of motion.     Cervical back: Normal range of motion and neck supple.  Skin:    General: Skin is warm.     Capillary Refill: Capillary refill takes less than 2 seconds.  Neurological:     Comments: No obvious seizure activity.  Patient is not making sense and moving all extremities.  No obvious eye deviation     ED Results / Procedures / Treatments   Labs (all labs ordered are listed, but only abnormal results are displayed) Labs Reviewed  CBC - Abnormal; Notable for the following components:      Result Value   RBC 3.66 (*)    Hemoglobin 10.9 (*)    HCT 33.3 (*)    All other components within normal limits  COMPREHENSIVE METABOLIC PANEL - Abnormal; Notable for the following components:   Glucose, Bld 155 (*)    Creatinine, Ser 1.80 (*)    GFR, Estimated 31 (*)    All other components within normal limits  I-STAT CHEM 8, ED - Abnormal; Notable for the following components:   BUN 24 (*)    Creatinine, Ser 1.80 (*)    Glucose, Bld 146 (*)    Calcium, Ion 1.10 (*)    Hemoglobin 11.9 (*)    HCT 35.0 (*)    All other components within normal limits  URINE CULTURE  PROTIME-INR  APTT  DIFFERENTIAL  ETHANOL  URINALYSIS, ROUTINE W REFLEX MICROSCOPIC  RAPID URINE DRUG SCREEN, HOSP PERFORMED  CBG MONITORING, ED    EKG None  Radiology CT HEAD CODE STROKE WO CONTRAST  Result Date: 04/04/2022 CLINICAL DATA:  Code stroke.  Acute neurologic deficit EXAM: CT HEAD WITHOUT CONTRAST TECHNIQUE: Contiguous axial images were obtained from the base of the skull through the vertex without intravenous contrast. RADIATION DOSE REDUCTION: This exam was performed according to the departmental dose-optimization program which includes automated exposure control, adjustment of the mA and/or kV according to patient size and/or use of iterative reconstruction  technique. COMPARISON:  None Available. FINDINGS: The examination is severely degraded by motion. Within that limitation, there is no acute hemorrhage visualized. Brain volume appears to be appropriate for age. IMPRESSION: Severely degraded by motion. Within that limitation, there is no acute hemorrhage visualized. Electronically Signed   By: Ulyses Jarred M.D.   On: 04/04/2022 22:19   CT Chest W Contrast  Result Date: 04/03/2022 CLINICAL DATA:  Left breast cancer, left upper extremity swelling EXAM: CT CHEST WITH CONTRAST TECHNIQUE: Multidetector CT imaging of the chest was performed during intravenous contrast administration. RADIATION DOSE REDUCTION: This exam was performed according to the departmental dose-optimization program which includes automated exposure control, adjustment of  the mA and/or kV according to patient size and/or use of iterative reconstruction technique. CONTRAST:  2m OMNIPAQUE IOHEXOL 300 MG/ML  SOLN COMPARISON:  None Available. FINDINGS: Cardiovascular: No significant coronary artery calcification. Mild cardiomegaly. No pericardial effusion. Central pulmonary arteries are of normal caliber. Mild atherosclerotic calcification within the thoracic aorta. No aortic aneurysm. Right subclavian chest port tip noted within the superior vena cava. Marked stenosis of the terminal right internal jugular vein and innominate vein may reflect the sequela of chronic thrombosis. Mediastinum/Nodes: 10 mm left thyroid nodule. Not clinically significant; no follow-up imaging recommended (ref: J Am Coll Radiol. 2015 Feb;12(2): 143-50).The esophagus is unremarkable. Left axillary lymph node dissection has been performed. No pathologic thoracic adenopathy. Lungs/Pleura: Lungs are clear. No pleural effusion or pneumothorax. Upper Abdomen: No acute abnormality. Musculoskeletal: No acute bone abnormality. Remote appearing superior endplate fracture of T7 without significant loss of height. Remote appearing  superior endplate fracture of TL38with approximately 40-50% loss of height. No retropulsion. Status post left mastectomy. IMPRESSION: 1. No acute intrathoracic pathology identified. No evidence of metastatic disease within the thorax. 2. Mild cardiomegaly. 3. Marked stenosis of the terminal right internal jugular vein and innominate vein surrounding the indwelling port catheter may reflect the sequela of chronic thrombosis. 4. Remote appearing superior endplate fractures of T7 and T12. Electronically Signed   By: AFidela SalisburyM.D.   On: 04/03/2022 00:57    Procedures Procedures    CENTRAL LINE Performed by: DWandra ArthursConsent: The procedure was performed in an emergent situation. Required items: required blood products, implants, devices, and special equipment available Patient identity confirmed: arm band and provided demographic data Time out: Immediately prior to procedure a "time out" was called to verify the correct patient, procedure, equipment, support staff and site/side marked as required. Indications: vascular access Anesthesia: local infiltration Local anesthetic: lidocaine 1% with epinephrine Anesthetic total: 3 ml Patient sedated: no Preparation: skin prepped with 2% chlorhexidine Skin prep agent dried: skin prep agent completely dried prior to procedure Sterile barriers: all five maximum sterile barriers used - cap, mask, sterile gown, sterile gloves, and large sterile sheet Hand hygiene: hand hygiene performed prior to central venous catheter insertion  Location details: L IJ  Catheter type: triple lumen Catheter size: 8 Fr Pre-procedure: landmarks identified Ultrasound guidance: yes Successful placement: yes Post-procedure: line sutured and dressing applied Assessment: blood return through all parts, free fluid flow, placement verified by x-ray and no pneumothorax on x-ray Patient tolerance: Patient tolerated the procedure well with no immediate complications.     CRITICAL CARE Performed by: DWandra Arthurs  Total critical care time: 30 minutes  Critical care time was exclusive of separately billable procedures and treating other patients.  Critical care was necessary to treat or prevent imminent or life-threatening deterioration.  Critical care was time spent personally by me on the following activities: development of treatment plan with patient and/or surrogate as well as nursing, discussions with consultants, evaluation of patient's response to treatment, examination of patient, obtaining history from patient or surrogate, ordering and performing treatments and interventions, ordering and review of laboratory studies, ordering and review of radiographic studies, pulse oximetry and re-evaluation of patient's condition.   Medications Ordered in ED Medications  sodium chloride flush (NS) 0.9 % injection 3 mL (has no administration in time range)  fentaNYL (SUBLIMAZE) 50 MCG/ML injection (has no administration in time range)  LORazepam (ATIVAN) injection 1 mg (has no administration in time range)  sodium chloride 0.9 %  bolus 1,000 mL (has no administration in time range)  LORazepam (ATIVAN) injection 2 mg (has no administration in time range)  levETIRAcetam (KEPPRA) 2,000 mg in sodium chloride 0.9 % 250 mL IVPB (has no administration in time range)  LORazepam (ATIVAN) injection 2 mg (2 mg Intramuscular Given 04/04/22 2220)    ED Course/ Medical Decision Making/ A&P                           Medical Decision Making Angelica Frandsen is a 66 y.o. female here presenting with seizure.  Patient had a seizure prior to arrival.  Patient is known alcohol abuse.  Patient has history of alcohol withdrawal seizures.  Patient is very confused on arrival.  She is thrashing around and moving all extremities.  She has no eye deviations on arrival.  Code stroke was activated by EMS and CT head showed no bleed.  Neurology was concern for seizure. Recommend IV Keppra  and Ativan.  11:03 PM Patient is difficult IV access.  I was able to place left IJ central line.  This is confirmed by chest x-ray.  I reviewed patient's labs and independently interpreted imaging study.  Patient has mild AKI.  Her alcohol level is negative.  Concern for possible alcohol withdrawal seizure. At this point hospitalist to admit for alcohol withdrawal seizures  Problems Addressed: Alcohol withdrawal seizure with delirium [facility]): acute illness or injury  Amount and/or Complexity of Data Reviewed Labs: ordered. Decision-making details documented in ED Course. Radiology: ordered and independent interpretation performed. Decision-making details documented in ED Course. ECG/medicine tests: ordered and independent interpretation performed. Decision-making details documented in ED Course.  Risk Prescription drug management. Decision regarding hospitalization.    Final Clinical Impression(s) / ED Diagnoses Final diagnoses:  None    Rx / DC Orders ED Discharge Orders     None         Drenda Freeze, MD 04/04/22 2304

## 2022-04-04 NOTE — Consult Note (Signed)
NEUROLOGY CONSULTATION NOTE   Date of service: April 04, 2022 Patient Name: [name] MRN:  [id number] DOB:  07-17-1956 Reason for consult: "confusion, stroke code" Requesting Provider: Drenda Freeze, MD _ _ _   _ __   _ __ _ _  __ __   _ __   __ _  History of Present Illness  [name] is a 66 y.o. female with PMH significant for breast cancer s/p mastectomy, hx of CKD 3, HTN, EtOh use with withdrawal seizures, morbid obesity, chronic diastolic CHF who presents to the hospital confused.  Per family, she has a hx of alcohol use. Seems like she was doing good but the night before had got some alcohol and was drinking. Unclear how much she had. When grand daughter left for work in the morning, she seemed drunk and when they came back in the evening, she seemed to be going through a hangover and was throwing up.  Family reports that they left to go get some groceries tonight around 2030 and about 20 mins later when they came back, she is on the floor, had some mild facial twitching and drooling at the mouth and was unresponsive. They called EMS and she was belligerent and they had a hard time getting her on the stretcher. She kept saying "you're hurting me". Was unable to follow any instructions.  EMS felt she was not able to look to the left and thus she was brought in as a code stroke. EMS also reported that she had urinated on herself and had foul smelling urine.  On arrival, she is agitated, confused, difficult to redirect. She will not stay still for a CT Head despite 22mg of Fentanyl. Kept repeating "I am hurting". She did not have any focal deficit. She has antigravity movement in all extremities, trying to sit up and get out of the bed on her own. Looks left and right and yelling. She would not follow commands.  LKW: 2030 mRS: 2 tNKASE: not offered, low suspicion for stroke. Thrombectomy: not offered, low suspicion for stroke. NIHSS components Score: Comment  1a Level of  Conscious 0'[]'$  1'[]'$  2'[x]'$  3'[]'$      1b LOC Questions 0'[]'$  1'[]'$  2'[x]'$       1c LOC Commands 0'[]'$  1'[]'$  2'[x]'$       2 Best Gaze 0'[x]'$  1'[]'$  2'[]'$       3 Visual 0'[x]'$  1'[]'$  2'[]'$  3'[]'$      4 Facial Palsy 0'[x]'$  1'[]'$  2'[]'$  3'[]'$      5a Motor Arm - left 0'[x]'$  1'[]'$  2'[]'$  3'[]'$  4'[]'$  UN'[]'$    5b Motor Arm - Right 0'[x]'$  1'[]'$  2'[]'$  3'[]'$  4'[]'$  UN'[]'$    6a Motor Leg - Left 0'[x]'$  1'[]'$  2'[]'$  3'[]'$  4'[]'$  UN'[]'$    6b Motor Leg - Right 0'[x]'$  1'[]'$  2'[]'$  3'[]'$  4'[]'$  UN'[]'$    7 Limb Ataxia 0'[x]'$  1'[]'$  2'[]'$  3'[]'$  UN'[]'$     8 Sensory 0'[x]'$  1'[]'$  2'[]'$  UN'[]'$      9 Best Language 0'[]'$  1'[x]'$  2'[]'$  3'[]'$      10 Dysarthria 0'[x]'$  1'[]'$  2'[]'$  UN'[]'$      11 Extinct. and Inattention 07 1'[]'$  2'[]'$       TOTAL:      ROS   Unable to obtain ROS 2/2 encephalopathy.  Past History   Past Medical History:  Diagnosis Date   Alcohol dependence ([place]    Aortic atherosclerosis ([facility]    Arthritis    Cancer ([facility] 11/2019   Left breast   Chronic heart failure ([facility]    Chronic kidney disease, stage 3 ([place]  COVID-19    Diverticulitis    Family history of ovarian cancer    Heart murmur    11/26/19 echo: Mild AS. AV mean gradient 12.0 mmHg; however, LVOT gradient 8 mmHg with intracvitary gradient-significant AS is not suspected   Hypertension    Right nephrolithiasis    Thoracic aortic aneurysm (HCC)    Tubular adenoma polyp of rectum    Past Surgical History:  Procedure Laterality Date   COLONOSCOPY WITH PROPOFOL N/A 02/24/2021   Procedure: COLONOSCOPY WITH PROPOFOL;  Surgeon: Yetta Flock, MD;  Location: [facility];  Service: Gastroenterology;  Laterality: N/A;   MASTECTOMY WITH AXILLARY LYMPH NODE DISSECTION Left 12/19/2019   Procedure: LEFT MASTECTOMY WITH AXILLARY LYMPH NODE DISSECTION;  Surgeon: [name], MD;  Location: [place];  Service: General;  Laterality: Left;   MULTIPLE EXTRACTIONS WITH ALVEOLOPLASTY Bilateral 12/14/2019   Procedure: MULTIPLE EXTRACTION WITH ALVEOLOPLASTY;  Surgeon: [name], DDS;  Location: [place];  Service: Oral Surgery;  Laterality: Bilateral;   POLYPECTOMY   02/24/2021   Procedure: POLYPECTOMY;  Surgeon: Yetta Flock, MD;  [facility];  Service: Gastroenterology;;   Sol Passer PLACEMENT N/A 12/19/2019   Procedure: INSERTION PORT-A-CATH WITH ULTRASOUND GUIDANCE;  Surgeon: [name], MD;  Location: [place];  Service: General;  Laterality: N/A;   RADIAL HEAD ARTHROPLASTY Right 06/25/2019   Procedure: RIGHT RADIAL HEAD ARTHROPLASTY;  Surgeon: [name], MD;  Location: [place];  Service: Orthopedics;  Laterality: Right;   TUBAL LIGATION     Family History  Problem Relation Age of Onset   Hypertension Mother    Dementia Mother    Ovarian cancer Half-Sister 18   Diabetes Half-Sister    Stroke Half-Sister    Social History   Socioeconomic History   Marital status: Legally Separated    Spouse name: Not on file   Number of children: Not on file   Years of education: Not on file   Highest education level: Not on file  Occupational History   Not on file  Tobacco Use   Smoking status: Never   Smokeless tobacco: Never  Vaping Use   Vaping Use: Never used  Substance and Sexual Activity   Alcohol use: Yes    Alcohol/week: 3.0 standard drinks of alcohol    Types: 1 Cans of beer, 2 Shots of liquor per week    Comment: daily   Drug use: Yes    Types: Marijuana   Sexual activity: Not Currently  Other Topics Concern   Not on file  Social History Narrative   Grand daughter stays with her   Mrs Sporrer cares for her 44 developmental delayed daughter [name] with mental health issues at home - Previous was at a group home   Social Determinants of Health   Financial Resource Strain: Low Risk  (07/04/2020)   Overall Financial Resource Strain (CARDIA)    Difficulty of Paying Living Expenses: Not hard at all  Food Insecurity: No Food Insecurity (12/10/2021)   Hunger Vital Sign    Worried About Running Out of Food in the Last Year: Never true    [place] in the Last Year: Never true  Transportation Needs: No  Transportation Needs (12/10/2021)   PRAPARE - Hydrologist (Medical): No    Lack of Transportation (Non-Medical): No  Physical Activity: Not on file  Stress: Stress Concern Present (12/10/2021)   [place]    Feeling of  Stress : Rather much  Social Connections: Moderately Integrated (12/10/2021)   Social Connection and Isolation Panel [NHANES]    Frequency of Communication with Friends and Family: More than three times a week    Frequency of Social Gatherings with Friends and Family: More than three times a week    Attends Religious Services: 1 to 4 times per year    Active Member of [employer] or Organizations: Yes    Attends Archivist Meetings: 1 to 4 times per year    Marital Status: Separated   No Known Allergies  Medications  (Not in a hospital admission)    Vitals   Vitals:   04/04/22 2100  Weight: 135.8 kg     Body mass index is 56.57 kg/m.  Physical Exam   General: Laying comfortably in bed; agitated delirium. Thrashing in the bed. HENT: Normal oropharynx and mucosa. Normal external appearance of ears and nose. Neck: Supple, no pain or tenderness  CV: No JVD. No peripheral edema.  Pulmonary: Symmetric Chest rise. Normal respiratory effort.  Abdomen: Soft to touch, non-tender.  Ext: No cyanosis, edema, or deformity  Skin: No rash. Normal palpation of skin.   Musculoskeletal: Normal digits and nails by inspection. No clubbing.   Neurologic Examination  Mental status/Cognition: eyes open, thrashing in the bed. Does not follow commands. Kepts repeating "I am hurting" would not tell us where the pain is. Speech/language: Fluent, limited 2/2 encephalopathy but speech is fluent, no dysarthria, keeps saying "I am hurting". Cranial nerves:   CN II Pupils equal and reactive to light, unable to assess for VF deficits.   CN III,IV,VI EOM intact, no gaze preference or  deviation, no nystagmus   CN V Corneals intact BL   CN VII no asymmetry, no nasolabial fold flattening   CN VIII Turns head towards speech   CN IX & X Uanble to assess.   CN XI 5/5 head turn.   CN XII midline tongue.   Motor:  Muscle bulk: normal Atleast antigravity movement noted on resistance to movement in all extremities. Attempting to sit up in the bed. Thrashing around in the bed.  Sensation:  Light touch Localizes in all extremities.   Pin prick    Temperature    Vibration   Proprioception    Coordination/Complex Motor:  Unable to assess but no obvious ataxia. - Gait: Deferred for patient safety.  Labs   CBC:  Recent Labs  Lab 04/02/22 1625 04/04/22 2206 04/04/22 2210  WBC 4.5 7.2  --   NEUTROABS  --  5.7  --   HGB 10.8* 10.9* 11.9*  HCT 34.3* 33.3* 35.0*  MCV 94.0 91.0  --   PLT 198 193  --     Basic Metabolic Panel:  Lab Results  Component Value Date   NA 138 04/04/2022   K 4.0 04/04/2022   CO2 27 04/02/2022   GLUCOSE 146 (H) 04/04/2022   BUN 24 (H) 04/04/2022   CREATININE 1.80 (H) 04/04/2022   CALCIUM 9.4 04/02/2022   GFRNONAA 36 (L) 04/02/2022   GFRAA 40 (L) 06/26/2020   Lipid Panel:  Lab Results  Component Value Date   LDLCALC 96 02/10/2020   HgbA1c:  Lab Results  Component Value Date   HGBA1C 5.0 09/01/2021   Urine Drug Screen:  pending   Alcohol Level  Pending   CT Head without contrast(Personally reviewed): Severely motion degraded but no acute hemorrhage noted.  rEEG:  pending  Impression   Saphyre Cillo is a  66 y.o. female with PMH significant for breast cancer s/p mastectomy, hx of CKD 3, HTN, EtOh use with withdrawal seizures, morbid obesity, chronic diastolic CHF who presents to the hospital confused, mild twtiching noted on the face along with drooling. Presentation is concerning for a seizure. Per family, had alcohol the night before and was drunk in AM and a hangover and withdrawal in the evening when they left to do  groceries and they returned to find her confused and on the floor with twitching of her face and drooling from her mouth.  She seems to be in agitated delirium here and not redirectable. Likely post ictal in nature.  Recommendations  - EtOH levels pending. - Keppra '1500mg'$  IV once. IF EtOh levels are negative, likely this is another withdrawal seizure and no indication for maintenance AEDs. - Ativan 1-'2mg'$  PRN for seizure lasting more than 3 mins. - CIWA protocol. - routine EEG. - manamgent of AKI per primary and ED team. - thiamine '100mg'$  PO or IV daily. ______________________________________________________________________   Thank you for the opportunity to take part in the care of this patient. If you have any further questions, please contact the neurology consultation attending.  Signed,  [place] Pager Number [phone] _ _ _   _ __   _ __ _ _  __ __   _ __   __ _

## 2022-04-04 NOTE — ED Notes (Signed)
Dr Darl Householder at bedside attempting to obtain ultrasound IV access.

## 2022-04-04 NOTE — ED Triage Notes (Signed)
Pt arrives via [facility] from home as code stroke. EMS was called by family for AMS - upon ems arrival pt was standing in a puddle of urine, tapping her feet. Pt only able to tell her name. W/ EMS pt was only looking to the R when answering to her name and had intermittent L ar weakness. Upon arrival to ED pt is combative, uncooperative w/ questions and exam

## 2022-04-04 NOTE — ED Notes (Addendum)
Pt combative in CT - unable to obtain CT head due to pt moving. This RN obtained 20g PIV in RAC - VO from Dr Lorrin Goodell to give 50 mcg fentanyl IV.

## 2022-04-05 ENCOUNTER — Observation Stay (HOSPITAL_COMMUNITY): Payer: Medicare Other

## 2022-04-05 ENCOUNTER — Telehealth: Payer: Self-pay

## 2022-04-05 ENCOUNTER — Encounter (HOSPITAL_COMMUNITY): Payer: Self-pay | Admitting: Internal Medicine

## 2022-04-05 ENCOUNTER — Other Ambulatory Visit: Payer: Self-pay

## 2022-04-05 DIAGNOSIS — F1020 Alcohol dependence, uncomplicated: Secondary | ICD-10-CM | POA: Diagnosis not present

## 2022-04-05 DIAGNOSIS — E876 Hypokalemia: Secondary | ICD-10-CM | POA: Diagnosis present

## 2022-04-05 DIAGNOSIS — K7460 Unspecified cirrhosis of liver: Secondary | ICD-10-CM | POA: Diagnosis present

## 2022-04-05 DIAGNOSIS — Z8616 Personal history of COVID-19: Secondary | ICD-10-CM | POA: Diagnosis not present

## 2022-04-05 DIAGNOSIS — F10239 Alcohol dependence with withdrawal, unspecified: Secondary | ICD-10-CM | POA: Diagnosis present

## 2022-04-05 DIAGNOSIS — Z853 Personal history of malignant neoplasm of breast: Secondary | ICD-10-CM | POA: Diagnosis not present

## 2022-04-05 DIAGNOSIS — N1831 Chronic kidney disease, stage 3a: Secondary | ICD-10-CM | POA: Diagnosis present

## 2022-04-05 DIAGNOSIS — C50412 Malignant neoplasm of upper-outer quadrant of left female breast: Secondary | ICD-10-CM | POA: Diagnosis not present

## 2022-04-05 DIAGNOSIS — Z170 Estrogen receptor positive status [ER+]: Secondary | ICD-10-CM | POA: Diagnosis not present

## 2022-04-05 DIAGNOSIS — Z9012 Acquired absence of left breast and nipple: Secondary | ICD-10-CM | POA: Diagnosis not present

## 2022-04-05 DIAGNOSIS — Z6841 Body Mass Index (BMI) 40.0 and over, adult: Secondary | ICD-10-CM | POA: Diagnosis not present

## 2022-04-05 DIAGNOSIS — I130 Hypertensive heart and chronic kidney disease with heart failure and stage 1 through stage 4 chronic kidney disease, or unspecified chronic kidney disease: Secondary | ICD-10-CM | POA: Diagnosis present

## 2022-04-05 DIAGNOSIS — Z833 Family history of diabetes mellitus: Secondary | ICD-10-CM | POA: Diagnosis not present

## 2022-04-05 DIAGNOSIS — N179 Acute kidney failure, unspecified: Secondary | ICD-10-CM | POA: Diagnosis present

## 2022-04-05 DIAGNOSIS — F10931 Alcohol use, unspecified with withdrawal delirium: Secondary | ICD-10-CM | POA: Diagnosis not present

## 2022-04-05 DIAGNOSIS — N390 Urinary tract infection, site not specified: Secondary | ICD-10-CM | POA: Diagnosis present

## 2022-04-05 DIAGNOSIS — R4182 Altered mental status, unspecified: Secondary | ICD-10-CM | POA: Diagnosis present

## 2022-04-05 DIAGNOSIS — Z8041 Family history of malignant neoplasm of ovary: Secondary | ICD-10-CM | POA: Diagnosis not present

## 2022-04-05 DIAGNOSIS — Z823 Family history of stroke: Secondary | ICD-10-CM | POA: Diagnosis not present

## 2022-04-05 DIAGNOSIS — R569 Unspecified convulsions: Secondary | ICD-10-CM | POA: Diagnosis not present

## 2022-04-05 DIAGNOSIS — Z79899 Other long term (current) drug therapy: Secondary | ICD-10-CM | POA: Diagnosis not present

## 2022-04-05 DIAGNOSIS — D631 Anemia in chronic kidney disease: Secondary | ICD-10-CM | POA: Diagnosis present

## 2022-04-05 DIAGNOSIS — I700 Atherosclerosis of aorta: Secondary | ICD-10-CM | POA: Diagnosis present

## 2022-04-05 DIAGNOSIS — I5032 Chronic diastolic (congestive) heart failure: Secondary | ICD-10-CM | POA: Diagnosis present

## 2022-04-05 DIAGNOSIS — I871 Compression of vein: Secondary | ICD-10-CM | POA: Diagnosis present

## 2022-04-05 DIAGNOSIS — G4089 Other seizures: Secondary | ICD-10-CM | POA: Diagnosis present

## 2022-04-05 DIAGNOSIS — Z8249 Family history of ischemic heart disease and other diseases of the circulatory system: Secondary | ICD-10-CM | POA: Diagnosis not present

## 2022-04-05 LAB — CBC WITH DIFFERENTIAL/PLATELET
Abs Immature Granulocytes: 0.02 K/uL (ref 0.00–0.07)
Basophils Absolute: 0 K/uL (ref 0.0–0.1)
Basophils Relative: 0 %
Eosinophils Absolute: 0 K/uL (ref 0.0–0.5)
Eosinophils Relative: 0 %
HCT: 30.9 % — ABNORMAL LOW (ref 36.0–46.0)
Hemoglobin: 10.1 g/dL — ABNORMAL LOW (ref 12.0–15.0)
Immature Granulocytes: 0 %
Lymphocytes Relative: 21 %
Lymphs Abs: 1.9 K/uL (ref 0.7–4.0)
MCH: 29.5 pg (ref 26.0–34.0)
MCHC: 32.7 g/dL (ref 30.0–36.0)
MCV: 90.4 fL (ref 80.0–100.0)
Monocytes Absolute: 0.6 K/uL (ref 0.1–1.0)
Monocytes Relative: 7 %
Neutro Abs: 6.4 K/uL (ref 1.7–7.7)
Neutrophils Relative %: 72 %
Platelets: 186 K/uL (ref 150–400)
RBC: 3.42 MIL/uL — ABNORMAL LOW (ref 3.87–5.11)
RDW: 13 % (ref 11.5–15.5)
WBC: 9.1 K/uL (ref 4.0–10.5)
nRBC: 0 % (ref 0.0–0.2)

## 2022-04-05 LAB — URINALYSIS, ROUTINE W REFLEX MICROSCOPIC
Bilirubin Urine: NEGATIVE
Glucose, UA: NEGATIVE mg/dL
Hgb urine dipstick: NEGATIVE
Ketones, ur: NEGATIVE mg/dL
Leukocytes,Ua: NEGATIVE
Nitrite: NEGATIVE
Protein, ur: NEGATIVE mg/dL
Specific Gravity, Urine: 1.006 (ref 1.005–1.030)
pH: 7 (ref 5.0–8.0)

## 2022-04-05 LAB — SARS CORONAVIRUS 2 BY RT PCR: SARS Coronavirus 2 by RT PCR: NEGATIVE

## 2022-04-05 LAB — CK: Total CK: 115 U/L (ref 38–234)

## 2022-04-05 LAB — I-STAT VENOUS BLOOD GAS, ED
Acid-Base Excess: 0 mmol/L (ref 0.0–2.0)
Bicarbonate: 26.1 mmol/L (ref 20.0–28.0)
Calcium, Ion: 1.17 mmol/L (ref 1.15–1.40)
HCT: 32 % — ABNORMAL LOW (ref 36.0–46.0)
Hemoglobin: 10.9 g/dL — ABNORMAL LOW (ref 12.0–15.0)
O2 Saturation: 99 %
Potassium: 3.6 mmol/L (ref 3.5–5.1)
Sodium: 140 mmol/L (ref 135–145)
TCO2: 27 mmol/L (ref 22–32)
pCO2, Ven: 46.7 mmHg (ref 44–60)
pH, Ven: 7.356 (ref 7.25–7.43)
pO2, Ven: 157 mmHg — ABNORMAL HIGH (ref 32–45)

## 2022-04-05 LAB — BASIC METABOLIC PANEL
Anion gap: 9 (ref 5–15)
BUN: 20 mg/dL (ref 8–23)
CO2: 24 mmol/L (ref 22–32)
Calcium: 8.9 mg/dL (ref 8.9–10.3)
Chloride: 106 mmol/L (ref 98–111)
Creatinine, Ser: 1.64 mg/dL — ABNORMAL HIGH (ref 0.44–1.00)
GFR, Estimated: 34 mL/min — ABNORMAL LOW (ref >60)
Glucose, Bld: 106 mg/dL — ABNORMAL HIGH (ref 70–99)
Potassium: 3.5 mmol/L (ref 3.5–5.1)
Sodium: 139 mmol/L (ref 135–145)

## 2022-04-05 LAB — HEPATIC FUNCTION PANEL
ALT: 10 U/L (ref 0–44)
AST: 15 U/L (ref 15–41)
Albumin: 3.4 g/dL — ABNORMAL LOW (ref 3.5–5.0)
Alkaline Phosphatase: 49 U/L (ref 38–126)
Bilirubin, Direct: <0.1 mg/dL (ref 0.0–0.2)
Total Bilirubin: 0.7 mg/dL (ref 0.3–1.2)
Total Protein: 7.4 g/dL (ref 6.5–8.1)

## 2022-04-05 LAB — CBG MONITORING, ED
Glucose-Capillary: 91 mg/dL (ref 70–99)
Glucose-Capillary: 96 mg/dL (ref 70–99)
Glucose-Capillary: 96 mg/dL (ref 70–99)

## 2022-04-05 LAB — RAPID URINE DRUG SCREEN, HOSP PERFORMED
Amphetamines: NOT DETECTED
Barbiturates: NOT DETECTED
Benzodiazepines: NOT DETECTED
Cocaine: NOT DETECTED
Opiates: NOT DETECTED
Tetrahydrocannabinol: POSITIVE — AB

## 2022-04-05 LAB — AMMONIA: Ammonia: 18 umol/L (ref 9–35)

## 2022-04-05 LAB — MAGNESIUM: Magnesium: 1.6 mg/dL — ABNORMAL LOW (ref 1.7–2.4)

## 2022-04-05 MED ORDER — MAGNESIUM SULFATE 2 GM/50ML IV SOLN
2.0000 g | Freq: Once | INTRAVENOUS | Status: AC
  Administered 2022-04-05: 2 g via INTRAVENOUS
  Filled 2022-04-05: qty 50

## 2022-04-05 MED ORDER — ACETAMINOPHEN 325 MG PO TABS
650.0000 mg | ORAL_TABLET | Freq: Four times a day (QID) | ORAL | Status: DC | PRN
  Administered 2022-04-05 (×2): 650 mg via ORAL
  Filled 2022-04-05 (×2): qty 2

## 2022-04-05 MED ORDER — LORAZEPAM 2 MG/ML IJ SOLN: 0.0000 mg | Freq: Two times a day (BID) | INTRAMUSCULAR | Status: DC

## 2022-04-05 MED ORDER — LORAZEPAM 2 MG/ML IJ SOLN
0.0000 mg | Freq: Four times a day (QID) | INTRAMUSCULAR | Status: DC
  Administered 2022-04-05: 2 mg via INTRAVENOUS
  Administered 2022-04-05: 1 mg via INTRAVENOUS
  Administered 2022-04-05: 2 mg via INTRAVENOUS
  Filled 2022-04-05 (×3): qty 1

## 2022-04-05 MED ORDER — ACETAMINOPHEN 650 MG RE SUPP: 650.0000 mg | Freq: Four times a day (QID) | RECTAL | Status: DC | PRN

## 2022-04-05 MED ORDER — ENOXAPARIN SODIUM 40 MG/0.4ML IJ SOSY
40.0000 mg | PREFILLED_SYRINGE | INTRAMUSCULAR | Status: DC
  Administered 2022-04-05 – 2022-04-06 (×2): 40 mg via SUBCUTANEOUS
  Filled 2022-04-05 (×2): qty 0.4

## 2022-04-05 MED ORDER — THIAMINE HCL 100 MG PO TABS
100.0000 mg | ORAL_TABLET | Freq: Every day | ORAL | Status: DC
  Administered 2022-04-06: 100 mg via ORAL
  Filled 2022-04-05: qty 1

## 2022-04-05 MED ORDER — FOLIC ACID 1 MG PO TABS
1.0000 mg | ORAL_TABLET | Freq: Every day | ORAL | Status: DC
  Administered 2022-04-05 – 2022-04-06 (×2): 1 mg via ORAL
  Filled 2022-04-05 (×2): qty 1

## 2022-04-05 MED ORDER — LORAZEPAM 2 MG/ML IJ SOLN
1.0000 mg | INTRAMUSCULAR | Status: DC | PRN
  Administered 2022-04-05: 1 mg via INTRAVENOUS
  Filled 2022-04-05: qty 1

## 2022-04-05 MED ORDER — CLONIDINE HCL 0.1 MG PO TABS
0.1000 mg | ORAL_TABLET | Freq: Three times a day (TID) | ORAL | Status: DC
  Administered 2022-04-05 – 2022-04-06 (×5): 0.1 mg via ORAL
  Filled 2022-04-05 (×5): qty 1

## 2022-04-05 MED ORDER — HYDRALAZINE HCL 20 MG/ML IJ SOLN
5.0000 mg | INTRAMUSCULAR | Status: DC | PRN
Start: 2022-04-05 — End: 2022-04-07

## 2022-04-05 MED ORDER — THIAMINE HCL 100 MG/ML IJ SOLN
100.0000 mg | Freq: Every day | INTRAMUSCULAR | Status: DC
  Administered 2022-04-05: 100 mg via INTRAVENOUS
  Filled 2022-04-05: qty 2

## 2022-04-05 MED ORDER — ADULT MULTIVITAMIN W/MINERALS CH
1.0000 | ORAL_TABLET | Freq: Every day | ORAL | Status: DC
  Administered 2022-04-06: 1 via ORAL
  Filled 2022-04-05: qty 1

## 2022-04-05 MED ORDER — LORAZEPAM 1 MG PO TABS
1.0000 mg | ORAL_TABLET | ORAL | Status: DC | PRN
  Administered 2022-04-05: 1 mg via ORAL
  Filled 2022-04-05: qty 1

## 2022-04-05 MED ORDER — SODIUM CHLORIDE 0.9 % IV BOLUS
250.0000 mL | Freq: Once | INTRAVENOUS | Status: AC
  Administered 2022-04-05: 250 mL via INTRAVENOUS

## 2022-04-05 NOTE — ED Notes (Signed)
Pt resting with eyes closed; respirations spontaneous, even, unlabored 

## 2022-04-05 NOTE — Procedures (Signed)
Patient Name: [name]  MRN: [id number]  Epilepsy Attending: Lora Havens  Referring Physician/Provider: Janine Ores, NP  Date: 04/05/2022 Duration: 27.34 mins  Patient history: 66 y.o. female with PMH significant for breast cancer s/p mastectomy, hx of CKD 3, HTN, EtOh use with withdrawal seizures, morbid obesity, chronic diastolic CHF who presents to the hospital confused, mild twtiching noted on the face along with drooling. EEG to evaluate for seizure.  Level of alertness: Awake, asleep  AEDs during EEG study: LEV, Ativan  Technical aspects: This EEG study was done with scalp electrodes positioned according to the 10-20 International system of electrode placement. Electrical activity was reviewed with band pass filter of 1-'70Hz'$ , sensitivity of 7 uV/mm, display speed of 93m/sec with a '60Hz'$  notched filter applied as appropriate. EEG data were recorded continuously and digitally stored.  Video monitoring was available and reviewed as appropriate.  Description: The posterior dominant rhythm consists of '8Hz'$  activity of moderate voltage (25-35 uV) seen predominantly in posterior head regions, symmetric and reactive to eye opening and eye closing. Sleep was characterized by vertex waves, sleep spindles (12 to 14 Hz), maximal frontocentral region.  EEG showed intermittent generalized 3 to 6 Hz theta-delta slowing. Sharp transients were noted in left anterior-temporal region. Hyperventilation and photic stimulation were not performed.     ABNORMALITY - Intermittent slow, generalized  IMPRESSION: This study is suggestive of mild diffuse encephalopathy, nonspecific etiology. No seizures or definite epileptiform discharges were seen throughout the recording.  A normal interictal EEG does not exclude nor support the diagnosis of epilepsy.   Sumayya Muha OBarbra Sarks

## 2022-04-05 NOTE — H&P (Addendum)
History and Physical    [name] VOH:[id number] DOB: Jul 30, 1956 DOA: 04/04/2022  PCP: [name], MD  Patient coming from: Home.  Chief Complaint: Agitation.  HPI: [name] is a 66 y.o. female with history of alcohol abuse, hypertension, diastolic dysfunction, chronic kidney disease stage III, history of breast cancer was brought to the ER after patient's family found patient lying on the floor with twitching of the face drooling of saliva and became unresponsive.  EMS on arrival patient was agitated.  Was awaiting to see the left side.  There are some concern for stroke and a code stroke was called.  ED Course: In the ER patient was agitated and required at least 6 mg of Ativan.  CT head did not show anything acute.  Neurology on-call was consulted.  At this time neurology feels patient has alcohol withdrawal seizure.  Patient admitted for further work-up.  At the time of my exam patient still encephalopathic.  Moving all extremities.  Pupils equal react light.  Labs are positive for marijuana and alcohol levels undetectable.  EKG shows sinus tachycardia.  Creatinine increased from baseline of around 1.5 it is around 1.8.  After admission patient started having fever.  COVID test is pending.  Patient had a central line placed for IV access.  Review of Systems: As per HPI, rest all negative.   Past Medical History:  Diagnosis Date   Alcohol dependence ([place])    Aortic atherosclerosis ([place])    Arthritis    Cancer ([place]) 11/2019   Left breast   Chronic heart failure (HCC)    Chronic kidney disease, stage 3 ([place])    COVID-19    Diverticulitis    Family history of ovarian cancer    Heart murmur    11/26/19 echo: Mild AS. AV mean gradient 12.0 mmHg; however, LVOT gradient 8 mmHg with intracvitary gradient-significant AS is not suspected   Hypertension    Right nephrolithiasis    Thoracic aortic aneurysm (HCC)    Tubular adenoma polyp of rectum     Past Surgical History:   Procedure Laterality Date   COLONOSCOPY WITH PROPOFOL N/A 02/24/2021   Procedure: COLONOSCOPY WITH PROPOFOL;  Surgeon: Yetta Flock, MD;  Location: [facility];  Service: Gastroenterology;  Laterality: N/A;   MASTECTOMY WITH AXILLARY LYMPH NODE DISSECTION Left 12/19/2019   Procedure: LEFT MASTECTOMY WITH AXILLARY LYMPH NODE DISSECTION;  Surgeon: [name], MD;  Location: [place];  Service: General;  Laterality: Left;   MULTIPLE EXTRACTIONS WITH ALVEOLOPLASTY Bilateral 12/14/2019   Procedure: MULTIPLE EXTRACTION WITH ALVEOLOPLASTY;  Surgeon: [name], DDS;  Location: [place];  Service: Oral Surgery;  Laterality: Bilateral;   POLYPECTOMY  02/24/2021   Procedure: POLYPECTOMY;  Surgeon: Yetta Flock, MD;  Location: [facility];  Service: Gastroenterology;;   Sol Passer PLACEMENT N/A 12/19/2019   Procedure: INSERTION PORT-A-CATH WITH ULTRASOUND GUIDANCE;  Surgeon: [name], MD;  Location: [name];  Service: General;  Laterality: N/A;   RADIAL HEAD ARTHROPLASTY Right 06/25/2019   Procedure: RIGHT RADIAL HEAD ARTHROPLASTY;  Surgeon: [name], MD;  Location: [facility];  Service: Orthopedics;  Laterality: Right;   TUBAL LIGATION       reports that she has never smoked. She has never used smokeless tobacco. She reports current alcohol use of about 3.0 standard drinks of alcohol per week. She reports current drug use. Drug: Marijuana.  No Known Allergies  Family History  Problem Relation Age of Onset   Hypertension Mother    Dementia Mother  Ovarian cancer Half-Sister 60   Diabetes Half-Sister    Stroke Half-Sister     Prior to Admission medications   Medication Sig Start Date End Date Taking? Authorizing Provider  albuterol (VENTOLIN HFA) 108 (90 Base) MCG/ACT inhaler Inhale 2 puffs into the lungs every 6 (six) hours as needed for wheezing or shortness of breath. Patient not taking: Reported on 02/05/2022 09/12/21 09/12/22  [name], Corrie Mckusick, MD  amLODipine (NORVASC)  10 MG tablet Take 1 tablet (10 mg total) by mouth daily. Patient taking differently: Take 10 mg by mouth every morning. 09/12/21   [name], [name], MD  BIOTIN PO Take 1 tablet by mouth daily.    [provider]  carvedilol (COREG) 25 MG tablet Take 1 tablet (25 mg total) by mouth 2 (two) times daily with a meal. 02/07/22 03/09/22  Pahwani, Michell Heinrich, MD  furosemide (LASIX) 20 MG tablet Take 1 tablet (20 mg total) by mouth daily. Please make overdue appt with Dr. Gasper Sells before anymore refills. Thank you 1st attempt Patient taking differently: Take 20 mg by mouth every morning. 06/03/21   Chandrasekhar, Lyda Kalata A, MD  gabapentin (NEURONTIN) 300 MG capsule Take 1 capsule (300 mg total) by mouth at bedtime. Patient taking differently: Take 300 mg by mouth 2 (two) times daily. 07/14/20   Magrinat, Virgie Dad, MD  Multiple Vitamin (MULTIVITAMIN WITH MINERALS) TABS tablet Take 1 tablet by mouth daily. 02/08/22   Pahwani, Michell Heinrich, MD  nystatin (MYCOSTATIN/NYSTOP) powder Apply topically 3 (three) times daily. Apply to arms pits, fat folds, inter-trigone Patient taking differently: Apply 1 application  topically 3 (three) times daily as needed (itching/rash). Apply to arms pits, fat folds, inter-trigone 09/12/21   [name], [name], MD  traMADol (ULTRAM) 50 MG tablet Take 50 mg by mouth every 8 (eight) hours as needed (pain). 12/10/21   [provider]  traZODone (DESYREL) 50 MG tablet Take 50 mg by mouth at bedtime. 01/18/22   [provider]  Vitamin D, Ergocalciferol, (DRISDOL) 1.25 MG (50000 UNIT) CAPS capsule Take 50,000 Units by mouth every Sunday. 10/30/21   [provider]    Physical Exam: Constitutional: Moderately built and nourished. Vitals:   04/04/22 2315 04/04/22 2330 04/04/22 2345 04/05/22 0030  BP: (!) 181/103 (!) 168/144  (!) 171/100  Pulse: (!) 110 (!) 119  (!) 114  Resp: 20 17  (!) 29  Temp:   (!) 100.4 F (38 C)   TempSrc:   Axillary   SpO2: 100% 100%   95%  Weight:       Eyes: Anicteric no pallor. ENMT: No discharge from the ears eyes nose and mouth. Neck: No mass felt.  No neck rigidity. Respiratory: No rhonchi or crepitations. Cardiovascular: S1-S2 heard. Abdomen: Soft nontender bowel sound present. Musculoskeletal: No edema. Skin: No rash. Neurologic: Patient is encephalopathic has received sedatives.  Pupils equal reacting to light.  Moving all extremities. Psychiatric: Patient is encephalopathic.   Labs on Admission: I have personally reviewed following labs and imaging studies  CBC: Recent Labs  Lab 04/02/22 1625 04/04/22 2206 04/04/22 2210  WBC 4.5 7.2  --   NEUTROABS  --  5.7  --   HGB 10.8* 10.9* 11.9*  HCT 34.3* 33.3* 35.0*  MCV 94.0 91.0  --   PLT 198 193  --    Basic Metabolic Panel: Recent Labs  Lab 04/02/22 1625 04/04/22 2206 04/04/22 2210  NA 141 137 138  K 3.8 4.0 4.0  CL 107 104 104  CO2 27 22  --  GLUCOSE 111* 155* 146*  BUN 30* 22 24*  CREATININE 1.59* 1.80* 1.80*  CALCIUM 9.4 9.2  --    GFR: Estimated Creatinine Clearance: 40.3 mL/min (A) (by C-G formula based on SCr of 1.8 mg/dL (H)). Liver Function Tests: Recent Labs  Lab 04/04/22 2206  AST 18  ALT 13  ALKPHOS 53  BILITOT 0.7  PROT 8.1  ALBUMIN 3.8   No results for input(s): "LIPASE", "AMYLASE" in the last 168 hours. No results for input(s): "AMMONIA" in the last 168 hours. Coagulation Profile: Recent Labs  Lab 04/04/22 2206  INR 1.1   Cardiac Enzymes: No results for input(s): "CKTOTAL", "CKMB", "CKMBINDEX", "TROPONINI" in the last 168 hours. BNP (last 3 results) No results for input(s): "PROBNP" in the last 8760 hours. HbA1C: No results for input(s): "HGBA1C" in the last 72 hours. CBG: Recent Labs  Lab 04/05/22 0043  GLUCAP 96   Lipid Profile: No results for input(s): "CHOL", "HDL", "LDLCALC", "TRIG", "CHOLHDL", "LDLDIRECT" in the last 72 hours. Thyroid Function Tests: No results for input(s): "TSH", "T4TOTAL",  "FREET4", "T3FREE", "THYROIDAB" in the last 72 hours. Anemia Panel: No results for input(s): "VITAMINB12", "FOLATE", "FERRITIN", "TIBC", "IRON", "RETICCTPCT" in the last 72 hours. Urine analysis:    Component Value Date/Time   COLORURINE YELLOW 08/31/2021 0044   APPEARANCEUR HAZY (A) 08/31/2021 0044   LABSPEC 1.009 08/31/2021 0044   PHURINE 6.0 08/31/2021 0044   GLUCOSEU NEGATIVE 08/31/2021 0044   HGBUR SMALL (A) 08/31/2021 0044   BILIRUBINUR NEGATIVE 08/31/2021 0044   KETONESUR NEGATIVE 08/31/2021 0044   PROTEINUR 100 (A) 08/31/2021 0044   UROBILINOGEN 1.0 07/14/2012 1630   NITRITE POSITIVE (A) 08/31/2021 0044   LEUKOCYTESUR MODERATE (A) 08/31/2021 0044   Sepsis Labs: '@LABRCNTIP'$ (procalcitonin:4,lacticidven:4) )No results found for this or any previous visit (from the past 240 hour(s)).   Radiological Exams on Admission: DG Chest Port 1 View  Result Date: 04/04/2022 CLINICAL DATA:  Central line placement. EXAM: PORTABLE CHEST 1 VIEW COMPARISON:  Chest radiograph 02/05/2022, chest CT yesterday. FINDINGS: New left internal jugular central venous catheter tip overlies the brachiocephalic confluence. No pneumothorax. Right chest port remains in place. Lung volumes are low. Similar cardiomegaly to prior exam. Aortic atherosclerosis. No pleural effusion or acute airspace disease. Left axillary surgical clips. IMPRESSION: 1. New left internal jugular central venous catheter with tip overlying the brachiocephalic confluence. No pneumothorax. 2. Unchanged cardiomegaly. Electronically Signed   By: Keith Rake M.D.   On: 04/04/2022 22:50   CT HEAD CODE STROKE WO CONTRAST  Result Date: 04/04/2022 CLINICAL DATA:  Code stroke.  Acute neurologic deficit EXAM: CT HEAD WITHOUT CONTRAST TECHNIQUE: Contiguous axial images were obtained from the base of the skull through the vertex without intravenous contrast. RADIATION DOSE REDUCTION: This exam was performed according to the departmental  dose-optimization program which includes automated exposure control, adjustment of the mA and/or kV according to patient size and/or use of iterative reconstruction technique. COMPARISON:  None Available. FINDINGS: The examination is severely degraded by motion. Within that limitation, there is no acute hemorrhage visualized. Brain volume appears to be appropriate for age. IMPRESSION: Severely degraded by motion. Within that limitation, there is no acute hemorrhage visualized. Electronically Signed   By: Ulyses Jarred M.D.   On: 04/04/2022 22:19    EKG: Independently reviewed.  Sinus tachycardia.  Assessment/Plan Principal Problem:   Seizure ([place]) Active Problems:   Alcohol dependence (HCC)   Malignant neoplasm of upper-outer quadrant of left breast in female, estrogen receptor positive ([place])  Alcohol withdrawal seizure -appreciate neurology consult.  Patient did receive loading dose of Keppra 1500 mg.  Alcohol levels were undetectable.  As per neurology patient did receive as needed Ativan for any seizure-like activity and and place patient on CIWA protocol.  We will closely monitor. Encephalopathy likely postictal and also from alcohol withdrawal.  Check ammonia levels.  Continue to monitor. Fever noticed after admission.  We will get blood cultures check COVID test. Hypertension we will keep patient n.p.o. and IV hydralazine until patient can take p.o. Acute on chronic kidney disease stage III likely could be from dehydration.  We will give 250 cc bolus.  We will closely monitor respiratory status since patient has diastolic CHF. History of diastolic CHF per 2D echo done in April 2022 EF of 60 to 65% with grade 2 diastolic dysfunction.  Closely monitor respiratory status. Chronic anemia likely from renal disease follow CBC. History of liver cirrhosis.  Checking ammonia levels. History of breast cancer.  X-ray pelvis is pending.   DVT prophylaxis: Lovenox. Code Status: Full  code. Family Communication: We will need to discuss with family. Disposition Plan: Home when stable. Consults called: Neurology. Admission status: Observation.   Rise Patience MD [facility] Pager [phone].  If 7PM-7AM, please contact night-coverage [email or web address] Password [email or web address]  04/05/2022, 1:09 AM

## 2022-04-05 NOTE — ED Notes (Signed)
RN [place] and Paramedic Layken Doenges were able to obtain one set of cultures from the pt who was a very difficult stick. Per Dr. Raliegh Ip the one set of cultures was enough. Paramedic and Nurse will continue to observe pt closely.

## 2022-04-05 NOTE — Progress Notes (Addendum)
PROGRESS NOTE  Brief Narrative: [name] s a 66 y.o. female with a history of alcohol abuse, HTN, chronic HFpEF, stage IIIa CKD, breast CA who presented to the ED 8/6 after family found her laying on the floor, unresponsive with facial twitching and drooling. She was agitated and brought in as code stroke. CT head did not reveal acute findings. EtOH level was negative, UDS only w/THC, negative UA, WBC 9.1k. The patient developed fever for which blood cultures were drawn, though no nidus of infection is currently noted. She was admitted this morning with neurology consulted with suspicion of alcohol withdrawal seizure, a recurrent presentation for her.   Subjective: Denies headache, vision changes, numbness or weakness. No neck pain. Reports pain throughout her legs she finds hard to describe. This appears to worsen when I palpate even lightly on all areas of her legs and her arms (?)  Objective: BP (!) 159/96   Pulse 90   Temp 100 F (37.8 C) (Axillary)   Resp (!) 27   Wt 135.8 kg   SpO2 98%   BMI 56.57 kg/m   Gen: Obese female in no distress resting quietly as I enter room Pulm: Clear and nonlabored, distant  CV: RRR, no murmur, no JVD, no edema GI: Soft, NT, ND, +BS  Neuro: Drowsy but rousable, oriented to place, not time or situation. MAE. Follows commands, touches chin to chest without difficulty or pain.  Skin: Right upper chest port palpable, nontender. Left IJ central line intact, without bleeding or discharge.   Assessment & Plan: Principal Problem:   Seizure ([place]) Active Problems:   Alcohol dependence ([place])   Malignant neoplasm of upper-outer quadrant of left breast in female, estrogen receptor positive ([place])  Acute encephalopathy: Suspected to be due to alcohol withdrawal, seizure, and post ictal. Severely motion-degraded CT without overt abnormality. Ammonia normal. - Remains agitated. Will continue on CIWA, prn ativan, and if necessary for patient safety soft  restraints. If ativan requirements escalate, would consider precedex gtt.  - Appreciate neurology recommendations. Received keppra load. - Given hx breast CA, could consider MRI brain, though would not be feasible currently.  Fever: Developed after arrival, no leukocytosis, UA without pyuria, CK normal, CXR without infiltrate. SARS-CoV-2 PCR negative.  - Monitor blood cultures. If positive, would need CVC removed. Pt also has port.   Chronic HFpEF, cardiomegaly on CXR, HTN: Does not appear volume overloaded at this time.  - Monitor volume status.  - Start clonidine as withdrawal adjunct and with severe range HTN  AKI on stage IIIa CKD: Given small bolus, BP elevated.  - Monitor while avoiding nephrotoxins.   History of breast CA: s/p modified radical left mastectomy April 2021, chemotherapy x2 cycles (due to poor tolerance), adjuvant radiation Sept 2021, anastrozole July 2021.  - F/u with Dr. Chryl Heck per routine.   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 Per oncology notes, no plans to use the port. D/w surgery who will arrange follow up with Dr. Ninfa Linden to discuss removal. No evidence of infection or obstruction currently.   Patrecia Pour, MD Pager on amion 04/05/2022, 8:57 AM

## 2022-04-05 NOTE — Telephone Encounter (Signed)
-----   Message from Gardenia Phlegm, NP sent at 04/05/2022  7:00 AM EDT ----- Regarding: RE: patient f/u Thanks for sharing.  I absolutely can f/u with her.    Chanler/Edithe Dobbin,   Could you please reach out to [facility] and get her scheduled this week?  Thanks.     ----- Message ----- From: Ripley Fraise, MD Sent: 04/03/2022   1:41 AM EDT To: Gardenia Phlegm, NP Subject: patient f/u                                    Can you follow-up with this patient?  She may have a chronic thrombosis in her port.  She was seen for left arm swelling but no signs of DVT.  Thanks!

## 2022-04-05 NOTE — ED Notes (Addendum)
Pt's bed wet with urine; sheets and chux changed, pt cleaned, placed in new gown

## 2022-04-05 NOTE — ED Notes (Signed)
Unable to collect labs d/t unable to access suitable vein site. RN advised. [employer]

## 2022-04-05 NOTE — ED Notes (Signed)
Called to assess if room is still in fact dirty (bed has been assigned for 2 hours at this point); 3W staff reports that there is a cleaning cart in front of the room currently

## 2022-04-05 NOTE — Telephone Encounter (Signed)
Attempted to call pt to offer appt with NP this week to further evaluate possible thrombus. LVM for pt to call back.

## 2022-04-05 NOTE — ED Notes (Signed)
Neurololgy at bedside

## 2022-04-05 NOTE — Progress Notes (Signed)
EEG completed, results pending. 

## 2022-04-05 NOTE — ED Notes (Signed)
Pt has interittent episodes of waking up and yelling "help me it hurts" When asked what hurts, pt keeps repeating "help me it hurts". Pt able to state her name when asked, disoriented to place, time & situation.

## 2022-04-05 NOTE — Progress Notes (Signed)
Pt received on unit. Bed is at the lowest position. Alarm on.

## 2022-04-05 NOTE — ED Notes (Signed)
ED TO INPATIENT HANDOFF REPORT  ED Nurse Name and Phone #: [phone]  S Name/Age/Gender [name] 66 y.o. female Room/Bed: 009C/009C  Code Status   Code Status: Full Code  Home/SNF/Other Home Patient oriented to: self and place Is this baseline? No   Triage Complete: Triage complete  Chief Complaint Seizure [facility]) [R56.9]  Triage Note Pt arrives via [facility] from home as code stroke. EMS was called by family for AMS - upon ems arrival pt was standing in a puddle of urine, tapping her feet. Pt only able to tell her name. W/ EMS pt was only looking to the R when answering to her name and had intermittent L ar weakness. Upon arrival to ED pt is combative, uncooperative w/ questions and exam   Allergies No Known Allergies  Level of Care/Admitting Diagnosis ED Disposition     ED Disposition  Admit   Condition  --   [place]: [place] [[id number]]  Level of Care: Progressive [102]  Admit to Progressive based on following criteria: MULTISYSTEM THREATS such as stable sepsis, metabolic/electrolyte imbalance with or without encephalopathy that is responding to early treatment.  May place patient in observation at [facility] or [facility] if equivalent level of care is available:: No  Covid Evaluation: Asymptomatic - no recent exposure (last 10 days) testing not required  Diagnosis: Seizure [facility]) [[id number]]  Admitting Physician: Rise Patience [phone]  Attending Physician: Beryle Flock          B Medical/Surgery History Past Medical History:  Diagnosis Date   Alcohol dependence ([place])    Aortic atherosclerosis ([place])    Arthritis    Cancer ([place]) 11/2019   Left breast   Chronic heart failure ([place])    Chronic kidney disease, stage 3 ([place])    COVID-19    Diverticulitis    Family history of ovarian cancer    Heart murmur    11/26/19 echo: Mild AS. AV mean gradient 12.0 mmHg; however, LVOT gradient 8 mmHg with intracvitary  gradient-significant AS is not suspected   Hypertension    Right nephrolithiasis    Thoracic aortic aneurysm (HCC)    Tubular adenoma polyp of rectum    Past Surgical History:  Procedure Laterality Date   COLONOSCOPY WITH PROPOFOL N/A 02/24/2021   Procedure: COLONOSCOPY WITH PROPOFOL;  Surgeon: Yetta Flock, MD;  Location: [facility];  Service: Gastroenterology;  Laterality: N/A;   MASTECTOMY WITH AXILLARY LYMPH NODE DISSECTION Left 12/19/2019   Procedure: LEFT MASTECTOMY WITH AXILLARY LYMPH NODE DISSECTION;  Surgeon: [name], MD;  Location: [place];  Service: General;  Laterality: Left;   MULTIPLE EXTRACTIONS WITH ALVEOLOPLASTY Bilateral 12/14/2019   Procedure: MULTIPLE EXTRACTION WITH ALVEOLOPLASTY;  Surgeon: [name], DDS;  Location: [place];  Service: Oral Surgery;  Laterality: Bilateral;   POLYPECTOMY  02/24/2021   Procedure: POLYPECTOMY;  Surgeon: Yetta Flock, MD;  Location: [facility];  Service: Gastroenterology;;   Sol Passer PLACEMENT N/A 12/19/2019   Procedure: INSERTION PORT-A-CATH WITH ULTRASOUND GUIDANCE;  Surgeon: [name], MD;  Location: [place];  Service: General;  Laterality: N/A;   RADIAL HEAD ARTHROPLASTY Right 06/25/2019   Procedure: RIGHT RADIAL HEAD ARTHROPLASTY;  Surgeon: [name], MD;  Location: [place];  Service: Orthopedics;  Laterality: Right;   TUBAL LIGATION       A IV Location/Drains/Wounds Patient Lines/Drains/Airways Status     Active Line/Drains/Airways     Name Placement date Placement time Site Days  Implanted Port 12/19/19 Right Chest 12/19/19  --  Chest  838   CVC Triple Lumen 04/04/22 Left Internal jugular 04/04/22  2237  -- 1   External Urinary Catheter 02/04/22  1804  --  60   Wound / Incision (Open or Dehisced) 02/04/22 Irritant Dermatitis (Moisture Associated Skin Damage) Vagina MASD 02/04/22  1800  Vagina  60   Wound / Incision (Open or Dehisced) 02/04/22 Irritant Dermatitis (Moisture Associated Skin  Damage) Pelvis Anterior;Lower;Mid MASD 02/04/22  1800  Pelvis  60            Intake/Output Last 24 hours  Intake/Output Summary (Last 24 hours) at 04/05/2022 1229 Last data filed at 04/05/2022 0533 Gross per 24 hour  Intake 1459.46 ml  Output --  Net 1459.46 ml    Labs/Imaging Results for orders placed or performed during the hospital encounter of 04/04/22 (from the past 48 hour(s))  Protime-INR     Status: None   Collection Time: 04/04/22 10:06 PM  Result Value Ref Range   Prothrombin Time 14.2 11.4 - 15.2 seconds   INR 1.1 0.8 - 1.2    Comment: (NOTE) INR goal varies based on device and disease states. Performed at [facility], [place] [address], [place], [place] [address]   APTT     Status: None   Collection Time: 04/04/22 10:06 PM  Result Value Ref Range   aPTT 27 24 - 36 seconds    Comment: Performed at [facility] [address] [place], [place] [address]  CBC     Status: Abnormal   Collection Time: 04/04/22 10:06 PM  Result Value Ref Range   WBC 7.2 4.0 - 10.5 K/uL   RBC 3.66 (L) 3.87 - 5.11 MIL/uL   Hemoglobin 10.9 (L) 12.0 - 15.0 g/dL   HCT 33.3 (L) 36.0 - 46.0 %   MCV 91.0 80.0 - 100.0 fL   MCH 29.8 26.0 - 34.0 pg   MCHC 32.7 30.0 - 36.0 g/dL   RDW 12.9 11.5 - 15.5 %   Platelets 193 150 - 400 K/uL   nRBC 0.0 0.0 - 0.2 %    Comment: Performed at [facility], [place] [address] [place], [place] [address]  Differential     Status: None   Collection Time: 04/04/22 10:06 PM  Result Value Ref Range   Neutrophils Relative % 79 %   Neutro Abs 5.7 1.7 - 7.7 K/uL   Lymphocytes Relative 17 %   Lymphs Abs 1.2 0.7 - 4.0 K/uL   Monocytes Relative 4 %   Monocytes Absolute 0.3 0.1 - 1.0 K/uL   Eosinophils Relative 0 %   Eosinophils Absolute 0.0 0.0 - 0.5 K/uL   Basophils Relative 0 %   Basophils Absolute 0.0 0.0 - 0.1 K/uL   Immature Granulocytes 0 %   Abs Immature Granulocytes 0.03 0.00 - 0.07 K/uL    Comment: Performed at [facility] [address] [place], [place] [address]  Comprehensive metabolic panel     Status: Abnormal   Collection Time: 04/04/22 10:06 PM  Result Value Ref Range   Sodium 137 135 - 145 mmol/L   Potassium 4.0 3.5 - 5.1 mmol/L   Chloride 104 98 - 111 mmol/L   CO2 22 22 - 32 mmol/L   Glucose, Bld 155 (H) 70 - 99 mg/dL    Comment: Glucose reference range applies only to samples taken after fasting for at least 8 hours.   BUN  22 8 - 23 mg/dL   Creatinine, Ser 1.80 (H) 0.44 - 1.00 mg/dL   Calcium 9.2 8.9 - 10.3 mg/dL   Total Protein 8.1 6.5 - 8.1 g/dL   Albumin 3.8 3.5 - 5.0 g/dL   AST 18 15 - 41 U/L   ALT 13 0 - 44 U/L   Alkaline Phosphatase 53 38 - 126 U/L   Total Bilirubin 0.7 0.3 - 1.2 mg/dL   GFR, Estimated 31 (L) >60 mL/min    Comment: (NOTE) Calculated using the CKD-EPI Creatinine Equation (2021)    Anion gap 11 5 - 15    Comment: Performed at [facility] [address] [place], [place] [address]  Ethanol     Status: None   Collection Time: 04/04/22 10:06 PM  Result Value Ref Range   Alcohol, Ethyl (B) <10 <10 mg/dL    Comment: (NOTE) Lowest detectable limit for serum alcohol is 10 mg/dL.  For medical purposes only. Performed at [facility], [place] [address], [place], [place] [address]   I-stat chem 8, ED     Status: Abnormal   Collection Time: 04/04/22 10:10 PM  Result Value Ref Range   Sodium 138 135 - 145 mmol/L   Potassium 4.0 3.5 - 5.1 mmol/L   Chloride 104 98 - 111 mmol/L   BUN 24 (H) 8 - 23 mg/dL   Creatinine, Ser 1.80 (H) 0.44 - 1.00 mg/dL   Glucose, Bld 146 (H) 70 - 99 mg/dL    Comment: Glucose reference range applies only to samples taken after fasting for at least 8 hours.   Calcium, Ion 1.10 (L) 1.15 - 1.40 mmol/L   TCO2 23 22 - 32 mmol/L   Hemoglobin 11.9 (L) 12.0 - 15.0 g/dL   HCT 35.0 (L) 36.0 - 46.0 %  CBG monitoring, ED     Status: None   Collection Time: 04/05/22 12:43 AM  Result Value Ref Range   Glucose-Capillary 96 70 - 99 mg/dL     Comment: Glucose reference range applies only to samples taken after fasting for at least 8 hours.  Urinalysis, Routine w reflex microscopic Urine, In & Out Cath     Status: Abnormal   Collection Time: 04/05/22  1:08 AM  Result Value Ref Range   Color, Urine COLORLESS (A) YELLOW   APPearance CLEAR CLEAR   Specific Gravity, Urine 1.006 1.005 - 1.030   pH 7.0 5.0 - 8.0   Glucose, UA NEGATIVE NEGATIVE mg/dL   Hgb urine dipstick NEGATIVE NEGATIVE   Bilirubin Urine NEGATIVE NEGATIVE   Ketones, ur NEGATIVE NEGATIVE mg/dL   Protein, ur NEGATIVE NEGATIVE mg/dL   Nitrite NEGATIVE NEGATIVE   Leukocytes,Ua NEGATIVE NEGATIVE    Comment: Performed at [facility] [address], [place], [place] [address]  Rapid urine drug screen (hospital performed)     Status: Abnormal   Collection Time: 04/05/22  1:09 AM  Result Value Ref Range   Opiates NONE DETECTED NONE DETECTED   Cocaine NONE DETECTED NONE DETECTED   Benzodiazepines NONE DETECTED NONE DETECTED   Amphetamines NONE DETECTED NONE DETECTED   Tetrahydrocannabinol POSITIVE (A) NONE DETECTED   Barbiturates NONE DETECTED NONE DETECTED    Comment: (NOTE) DRUG SCREEN FOR MEDICAL PURPOSES ONLY.  IF CONFIRMATION IS NEEDED FOR ANY PURPOSE, NOTIFY LAB WITHIN 5 DAYS.  LOWEST DETECTABLE LIMITS FOR URINE DRUG SCREEN Drug Class  Cutoff (ng/mL) Amphetamine and metabolites    1000 Barbiturate and metabolites    200 Benzodiazepine                 500 Tricyclics and metabolites     300 Opiates and metabolites        300 Cocaine and metabolites        300 THC                            50 Performed at [facility], [place] [address] [place], [place] [address]   Hepatic function panel     Status: Abnormal   Collection Time: 04/05/22  2:06 AM  Result Value Ref Range   Total Protein 7.4 6.5 - 8.1 g/dL   Albumin 3.4 (L) 3.5 - 5.0 g/dL   AST 15 15 - 41 U/L   ALT 10 0 - 44 U/L   Alkaline Phosphatase 49 38 - 126 U/L    Total Bilirubin 0.7 0.3 - 1.2 mg/dL   Bilirubin, Direct <0.1 0.0 - 0.2 mg/dL   Indirect Bilirubin NOT CALCULATED 0.3 - 0.9 mg/dL    Comment: Performed at [facility] [address], [place], [place] [address]  Basic metabolic panel     Status: Abnormal   Collection Time: 04/05/22  2:06 AM  Result Value Ref Range   Sodium 139 135 - 145 mmol/L   Potassium 3.5 3.5 - 5.1 mmol/L   Chloride 106 98 - 111 mmol/L   CO2 24 22 - 32 mmol/L   Glucose, Bld 106 (H) 70 - 99 mg/dL    Comment: Glucose reference range applies only to samples taken after fasting for at least 8 hours.   BUN 20 8 - 23 mg/dL   Creatinine, Ser 1.64 (H) 0.44 - 1.00 mg/dL   Calcium 8.9 8.9 - 10.3 mg/dL   GFR, Estimated 34 (L) >60 mL/min    Comment: (NOTE) Calculated using the CKD-EPI Creatinine Equation (2021)    Anion gap 9 5 - 15    Comment: Performed at [facility] [address] [place], [place] [address]  CBC with Differential/Platelet     Status: Abnormal   Collection Time: 04/05/22  2:06 AM  Result Value Ref Range   WBC 9.1 4.0 - 10.5 K/uL   RBC 3.42 (L) 3.87 - 5.11 MIL/uL   Hemoglobin 10.1 (L) 12.0 - 15.0 g/dL   HCT 30.9 (L) 36.0 - 46.0 %   MCV 90.4 80.0 - 100.0 fL   MCH 29.5 26.0 - 34.0 pg   MCHC 32.7 30.0 - 36.0 g/dL   RDW 13.0 11.5 - 15.5 %   Platelets 186 150 - 400 K/uL   nRBC 0.0 0.0 - 0.2 %   Neutrophils Relative % 72 %   Neutro Abs 6.4 1.7 - 7.7 K/uL   Lymphocytes Relative 21 %   Lymphs Abs 1.9 0.7 - 4.0 K/uL   Monocytes Relative 7 %   Monocytes Absolute 0.6 0.1 - 1.0 K/uL   Eosinophils Relative 0 %   Eosinophils Absolute 0.0 0.0 - 0.5 K/uL   Basophils Relative 0 %   Basophils Absolute 0.0 0.0 - 0.1 K/uL   Immature Granulocytes 0 %   Abs Immature Granulocytes 0.02 0.00 - 0.07 K/uL    Comment: Performed at [facility], [address], [place], [place] [address]  Magnesium     Status: Abnormal  Collection Time: 04/05/22  2:06 AM  Result Value Ref Range   Magnesium 1.6  (L) 1.7 - 2.4 mg/dL    Comment: Performed at [facility] [address] [place], [place] [address]  SARS Coronavirus 2 by RT PCR (hospital order, performed in [facility] hospital lab) *cepheid single result test* Anterior Nasal Swab     Status: None   Collection Time: 04/05/22  2:57 AM   Specimen: Anterior Nasal Swab  Result Value Ref Range   SARS Coronavirus 2 by RT PCR NEGATIVE NEGATIVE    Comment: (NOTE) SARS-CoV-2 target nucleic acids are NOT DETECTED.  The SARS-CoV-2 RNA is generally detectable in upper and lower respiratory specimens during the acute phase of infection. The lowest concentration of SARS-CoV-2 viral copies this assay can detect is 250 copies / mL. A negative result does not preclude SARS-CoV-2 infection and should not be used as the sole basis for treatment or other patient management decisions.  A negative result may occur with improper specimen collection / handling, submission of specimen other than nasopharyngeal swab, presence of viral mutation(s) within the areas targeted by this assay, and inadequate number of viral copies (<250 copies / mL). A negative result must be combined with clinical observations, patient history, and epidemiological information.  Fact Sheet for Patients:   https://www.patel.info/  Fact Sheet for Healthcare Providers: https://hall.com/  This test is not yet approved or  cleared by the [place] FDA and has been authorized for detection and/or diagnosis of SARS-CoV-2 by FDA under an Emergency Use Authorization (EUA).  This EUA will remain in effect (meaning this test can be used) for the duration of the COVID-19 declaration under Section 564(b)(1) of the Act, 21 U.S.C. section 360bbb-3(b)(1), unless the authorization is terminated or revoked sooner.  Performed at [facility], [place] [address], [place], [place] [address]   I-Stat venous blood gas, [facility] ED only)      Status: Abnormal   Collection Time: 04/05/22  3:03 AM  Result Value Ref Range   pH, Ven 7.356 7.25 - 7.43   pCO2, Ven 46.7 44 - 60 mmHg   pO2, Ven 157 (H) 32 - 45 mmHg   Bicarbonate 26.1 20.0 - 28.0 mmol/L   TCO2 27 22 - 32 mmol/L   O2 Saturation 99 %   Acid-Base Excess 0.0 0.0 - 2.0 mmol/L   Sodium 140 135 - 145 mmol/L   Potassium 3.6 3.5 - 5.1 mmol/L   Calcium, Ion 1.17 1.15 - 1.40 mmol/L   HCT 32.0 (L) 36.0 - 46.0 %   Hemoglobin 10.9 (L) 12.0 - 15.0 g/dL   Sample type VENOUS   Ammonia     Status: None   Collection Time: 04/05/22  3:30 AM  Result Value Ref Range   Ammonia 18 9 - 35 umol/L    Comment: Performed at [facility], [place] [address], , [place] [address]  CK     Status: None   Collection Time: 04/05/22  5:12 AM  Result Value Ref Range   Total CK 115 38 - 234 U/L    Comment: Performed at [facility], [place] [address], [place], [place] [address]  CBG monitoring, ED     Status: None   Collection Time: 04/05/22  6:30 AM  Result Value Ref Range   Glucose-Capillary 96 70 - 99 mg/dL    Comment: Glucose reference range applies only to samples taken after fasting for at least 8 hours.  DG Pelvis Portable  Result Date: 04/05/2022 CLINICAL DATA:  Code stroke. Found lying on floor. History of breast cancer. EXAM: PORTABLE PELVIS 1-2 VIEWS COMPARISON:  CT pelvis 06/10/2021 FINDINGS: Detailed assessment is limited by soft tissue attenuation from habitus. The cortical margins of the bony pelvis are intact. No evidence of fracture. Pubic symphysis and sacroiliac joints are congruent. Both femoral heads are well-seated in the respective acetabula. IMPRESSION: No evidence of pelvic fracture. Electronically Signed   By: Keith Rake M.D.   On: 04/05/2022 03:36   DG Chest Port 1 View  Result Date: 04/04/2022 CLINICAL DATA:  Central line placement. EXAM: PORTABLE CHEST 1 VIEW COMPARISON:  Chest radiograph 02/05/2022, chest CT yesterday. FINDINGS: New left internal  jugular central venous catheter tip overlies the brachiocephalic confluence. No pneumothorax. Right chest port remains in place. Lung volumes are low. Similar cardiomegaly to prior exam. Aortic atherosclerosis. No pleural effusion or acute airspace disease. Left axillary surgical clips. IMPRESSION: 1. New left internal jugular central venous catheter with tip overlying the brachiocephalic confluence. No pneumothorax. 2. Unchanged cardiomegaly. Electronically Signed   By: Keith Rake M.D.   On: 04/04/2022 22:50   CT HEAD CODE STROKE WO CONTRAST  Result Date: 04/04/2022 CLINICAL DATA:  Code stroke.  Acute neurologic deficit EXAM: CT HEAD WITHOUT CONTRAST TECHNIQUE: Contiguous axial images were obtained from the base of the skull through the vertex without intravenous contrast. RADIATION DOSE REDUCTION: This exam was performed according to the departmental dose-optimization program which includes automated exposure control, adjustment of the mA and/or kV according to patient size and/or use of iterative reconstruction technique. COMPARISON:  None Available. FINDINGS: The examination is severely degraded by motion. Within that limitation, there is no acute hemorrhage visualized. Brain volume appears to be appropriate for age. IMPRESSION: Severely degraded by motion. Within that limitation, there is no acute hemorrhage visualized. Electronically Signed   By: Ulyses Jarred M.D.   On: 04/04/2022 22:19    Pending Labs Unresulted Labs (From admission, onward)     Start     Ordered   04/12/22 0500  Creatinine, serum  (enoxaparin (LOVENOX)    CrCl >/= 30 ml/min)  Weekly,   R     Comments: while on enoxaparin therapy    04/05/22 0108   04/06/22 0500  Comprehensive metabolic panel  Tomorrow morning,   R        04/05/22 0919   04/06/22 0500  CBC with Differential/Platelet  Tomorrow morning,   R        04/05/22 0919   04/06/22 0500  Magnesium  Tomorrow morning,   R        04/05/22 0919   04/05/22 0257   Culture, blood (Routine X 2) w Reflex to ID Panel  BLOOD CULTURE X 2,   R (with TIMED occurrences)      04/05/22 0256   04/04/22 2147  Urine Culture  Once,   URGENT       Question:  Indication  Answer:  Dysuria   04/04/22 2146            Vitals/Pain Today's Vitals   04/05/22 1015 04/05/22 1030 04/05/22 1200 04/05/22 1229  BP:  (!) 146/83 139/66   Pulse: 93  82   Resp: (!) 28 (!) 25 20   Temp:      TempSrc:      SpO2: 96%  97%   Weight:      PainSc:    Asleep    Isolation Precautions Airborne  and Contact precautions  Medications Medications  sodium chloride flush (NS) 0.9 % injection 3 mL (3 mLs Intravenous Not Given 04/04/22 2120)  LORazepam (ATIVAN) tablet 1-4 mg ( Oral See Alternative 04/05/22 0917)    Or  LORazepam (ATIVAN) injection 1-4 mg (1 mg Intravenous Given 04/05/22 0917)  thiamine (VITAMIN B1) tablet 100 mg ( Oral See Alternative 04/05/22 0921)    Or  thiamine (VITAMIN B1) injection 100 mg (100 mg Intravenous Given 12/31/96 2641)  folic acid (FOLVITE) tablet 1 mg (1 mg Oral Given 04/05/22 0947)  multivitamin with minerals tablet 1 tablet (1 tablet Oral Not Given 04/05/22 0903)  LORazepam (ATIVAN) injection 0-4 mg (2 mg Intravenous Given 04/05/22 0629)    Followed by  LORazepam (ATIVAN) injection 0-4 mg (has no administration in time range)  enoxaparin (LOVENOX) injection 40 mg (40 mg Subcutaneous Given 04/05/22 0301)  acetaminophen (TYLENOL) tablet 650 mg (650 mg Oral Given 04/05/22 0947)    Or  acetaminophen (TYLENOL) suppository 650 mg ( Rectal See Alternative 04/05/22 0947)  hydrALAZINE (APRESOLINE) injection 5 mg (has no administration in time range)  cloNIDine (CATAPRES) tablet 0.1 mg (0.1 mg Oral Given 04/05/22 0948)  fentaNYL (SUBLIMAZE) 50 MCG/ML injection (50 mcg  Given 04/04/22 2130)  LORazepam (ATIVAN) injection 1 mg (1 mg Intravenous Given 04/04/22 2152)  sodium chloride 0.9 % bolus 1,000 mL ( Intravenous Stopped 04/05/22 0028)  LORazepam (ATIVAN) injection 2 mg (2 mg  Intramuscular Given 04/04/22 2220)  levETIRAcetam (KEPPRA) IVPB 1000 mg/100 mL premix (0 mg Intravenous Stopped 04/05/22 0037)    And  levETIRAcetam (KEPPRA) IVPB 1000 mg/100 mL premix (0 mg Intravenous Stopped 04/04/22 2343)  sodium chloride 0.9 % bolus 250 mL (0 mLs Intravenous Stopped 04/05/22 0533)  magnesium sulfate IVPB 2 g 50 mL (0 g Intravenous Stopped 04/05/22 1229)    Mobility walks with device High fall risk   Focused Assessments    R Recommendations: See Admitting Provider Note  Report given to:   Additional Notes:

## 2022-04-05 NOTE — Progress Notes (Addendum)
Neurology Progress Note Brief HPI: [name] is a 66 y.o. female with PMH significant for breast cancer s/p mastectomy, hx of CKD 3, HTN, EtOh use with withdrawal seizures, morbid obesity, chronic diastolic CHF presenting to the hospital with altered mental status. She was seen by her granddaughter on 8/6 appearing drunk in the morning and hung over in the evening. Around 2050 on 8/6 she was found on the floor with facial twitching, drooling, and unresponsive.  Of note, patient does have a history of breast cancer and she does have a port. Upon chart review, port may have a chronic thrombosis in her port.   Subjective: Patient is seen in ED. She is restless in the bed and currently requiring bilateral wrist restraints for safety. She is difficult to redirect and is agitated due to pain/discomfort that she reports in her back, arms, and legs. She received ativan approximately 1 hour prior to my exam.   Exam: Vitals:   04/05/22 0625 04/05/22 0715  BP: (!) 165/97 (!) 159/96  Pulse: 97 90  Resp:  (!) 27  Temp:    SpO2:  98%   Gen: In bed, restless Resp: non-labored breathing, no acute distress Abd: soft, nt  Neuro: MS: Eyes closed, repeating that she is "hurting everywhere", restless in bed. Needs frequent prompting to answer questions. Oriented to self and place, but states it is 2019.  CN: PERRL, EOM intact. Unable to assess for visual field deficits, Corneals intact bilaterally, no facial asymmetry, head is midline, hearing intact, tongue is midline Motor: Moves all extremities antigravity but is generally weak. Bilateral hand grasps 4/5 with poor effort RUE 4/5 LUE 4/5  RLE 4/5 LLE 4/5 Sensory: Localizes in all extremities No obvious ataxia Gait deferred for patient safety  Pertinent Labs: ETOH- <10 UDS positive for THC Cr 1.64, GFR 34  CT Head- negative for hemorrhage but motion degraded  Impression: [name] is a 66 y.o. female with PMH significant for breast cancer  s/p mastectomy, hx of CKD 3, HTN, EtOh use with withdrawal seizures, morbid obesity, chronic diastolic CHF who presents to the hospital confused, mild twtiching noted on the face along with drooling. Per family, had alcohol the night before and was drunk in AM and a hangover and withdrawal in the evening when they left to do groceries and they returned to find her confused and on the floor with twitching of her face and drooling from her mouth. Her presentation is concerning for alcohol withdrawal seizure given ETOH level less than 10. She received a loading dose of keppra '1500mg'$  but there is no indication for maintenance AEDs at this time.   Recommendations: - Continue CIWA protocol - EEG is pending  - Continue Thiamine '100mg'$  daily  - Neurology will follow for EEG results and then likely sign off.   Patient seen and examined by NP/APP  Janine Ores, DNP, FNP-BC [facility] Pager: [phone]  Addendum: - EEG: Intermittent slow, generalized. This study is suggestive of mild diffuse encephalopathy, nonspecific to etiology. No seizures or definite epileptiform discharges were seen throughout the recording. - No change to current management.  - Neurology will sign off. Please call if there are additional questions.   Electronically signed: Dr. Kerney Elbe

## 2022-04-06 DIAGNOSIS — C50412 Malignant neoplasm of upper-outer quadrant of left female breast: Secondary | ICD-10-CM

## 2022-04-06 DIAGNOSIS — Z170 Estrogen receptor positive status [ER+]: Secondary | ICD-10-CM | POA: Diagnosis not present

## 2022-04-06 DIAGNOSIS — R569 Unspecified convulsions: Secondary | ICD-10-CM | POA: Diagnosis not present

## 2022-04-06 DIAGNOSIS — F1020 Alcohol dependence, uncomplicated: Secondary | ICD-10-CM | POA: Diagnosis not present

## 2022-04-06 LAB — COMPREHENSIVE METABOLIC PANEL
ALT: 9 U/L (ref 0–44)
AST: 16 U/L (ref 15–41)
Albumin: 3.2 g/dL — ABNORMAL LOW (ref 3.5–5.0)
Alkaline Phosphatase: 44 U/L (ref 38–126)
Anion gap: 9 (ref 5–15)
BUN: 17 mg/dL (ref 8–23)
CO2: 25 mmol/L (ref 22–32)
Calcium: 9.1 mg/dL (ref 8.9–10.3)
Chloride: 104 mmol/L (ref 98–111)
Creatinine, Ser: 1.65 mg/dL — ABNORMAL HIGH (ref 0.44–1.00)
GFR, Estimated: 34 mL/min — ABNORMAL LOW (ref >60)
Glucose, Bld: 92 mg/dL (ref 70–99)
Potassium: 3.3 mmol/L — ABNORMAL LOW (ref 3.5–5.1)
Sodium: 138 mmol/L (ref 135–145)
Total Bilirubin: 0.4 mg/dL (ref 0.3–1.2)
Total Protein: 7.1 g/dL (ref 6.5–8.1)

## 2022-04-06 LAB — CBC WITH DIFFERENTIAL/PLATELET
Abs Immature Granulocytes: 0.02 10*3/uL (ref 0.00–0.07)
Basophils Absolute: 0 10*3/uL (ref 0.0–0.1)
Basophils Relative: 0 %
Eosinophils Absolute: 0.2 10*3/uL (ref 0.0–0.5)
Eosinophils Relative: 3 %
HCT: 29.8 % — ABNORMAL LOW (ref 36.0–46.0)
Hemoglobin: 9.7 g/dL — ABNORMAL LOW (ref 12.0–15.0)
Immature Granulocytes: 0 %
Lymphocytes Relative: 41 %
Lymphs Abs: 2.1 10*3/uL (ref 0.7–4.0)
MCH: 29.4 pg (ref 26.0–34.0)
MCHC: 32.6 g/dL (ref 30.0–36.0)
MCV: 90.3 fL (ref 80.0–100.0)
Monocytes Absolute: 0.5 10*3/uL (ref 0.1–1.0)
Monocytes Relative: 9 %
Neutro Abs: 2.4 10*3/uL (ref 1.7–7.7)
Neutrophils Relative %: 47 %
Platelets: 168 10*3/uL (ref 150–400)
RBC: 3.3 MIL/uL — ABNORMAL LOW (ref 3.87–5.11)
RDW: 13 % (ref 11.5–15.5)
WBC: 5.2 10*3/uL (ref 4.0–10.5)
nRBC: 0 % (ref 0.0–0.2)

## 2022-04-06 LAB — GLUCOSE, CAPILLARY
Glucose-Capillary: 105 mg/dL — ABNORMAL HIGH (ref 70–99)
Glucose-Capillary: 123 mg/dL — ABNORMAL HIGH (ref 70–99)
Glucose-Capillary: 129 mg/dL — ABNORMAL HIGH (ref 70–99)
Glucose-Capillary: 91 mg/dL (ref 70–99)

## 2022-04-06 LAB — MAGNESIUM: Magnesium: 1.9 mg/dL (ref 1.7–2.4)

## 2022-04-06 MED ORDER — FOSFOMYCIN TROMETHAMINE 3 G PO PACK
3.0000 g | PACK | Freq: Once | ORAL | Status: AC
  Administered 2022-04-06: 3 g via ORAL
  Filled 2022-04-06: qty 3

## 2022-04-06 MED ORDER — POTASSIUM CHLORIDE CRYS ER 20 MEQ PO TBCR
40.0000 meq | EXTENDED_RELEASE_TABLET | Freq: Once | ORAL | Status: AC
  Administered 2022-04-06: 40 meq via ORAL
  Filled 2022-04-06: qty 2

## 2022-04-06 NOTE — Telephone Encounter (Signed)
Attempted to call pt X2 to offer appt. LVM for call back.

## 2022-04-06 NOTE — TOC CAGE-AID Note (Signed)
Transition of Care [facility]) - CAGE-AID Screening   Patient Details  Name: [name] MRN: [id number] Date of Birth: [birth year]  Transition of Care [facility]) CM/SW Contact:    Pollie Friar, RN Phone Number: 04/06/2022, 11:41 AM   Clinical Narrative: Pt states she has been through meetings in the past and quit alcohol for a long time. She is not sure who left alcohol in her home as she doesn't buy any. She says her son will make sure alcohol is not in home again.    CAGE-AID Screening:    Have You Ever Felt You Ought to Cut Down on Your Drinking or Drug Use?: No Have People Annoyed You By Critizing Your Drinking Or Drug Use?: No Have You Felt Bad Or Guilty About Your Drinking Or Drug Use?: Yes Have You Ever Had a Drink or Used Drugs First Thing In The Morning to Steady Your Nerves or to Get Rid of a Hangover?: No CAGE-AID Score: 1  Substance Abuse Education Offered: Yes (refused)

## 2022-04-06 NOTE — Progress Notes (Signed)
Patient is calm,  cooperative, alert and oriented X4.  Bilateral wrist restraint order expired and discontinued.  Will continue to monitor pt.

## 2022-04-06 NOTE — TOC Transition Note (Signed)
Transition of Care [facility]) - CM/SW Discharge Note   Patient Details  Name: [name] MRN: [id number] Date of Birth: 1956/02/14  Transition of Care [facility]) CM/SW Contact:  Pollie Friar, RN Phone Number: 04/06/2022, 11:30 AM   Clinical Narrative:    Pt is from home with her granddaughter. Her son also checks in on her daily.  Pt is discharging home with home health services through [facility]. Information on the AVS.  Pt has needed DME at home: shower stool/ walker Pt manages her own medications and denies any issues.  Family provides needed transportation. CM has left voicemail for son to provide transport home today.   Final next level of care: Home w Home Health Services Barriers to Discharge: No Barriers Identified   Patient Goals and CMS Choice   CMS Medicare.gov Compare Post Acute Care list provided to:: Patient Choice offered to / list presented to : Patient  Discharge Placement                       Discharge Plan and Services                          HH Arranged: PT, OT [facility] Agency: [place] Date [place]: 04/06/22   Representative spoke with at [place]: [place] Interventions     Readmission Risk Interventions    09/07/2021   11:26 AM 03/06/2020   10:03 AM 02/13/2020   10:59 AM  Readmission Risk Prevention Plan  Transportation Screening Complete Complete Complete  PCP or Specialist Appt within 3-5 Days  Not Complete   Not Complete comments  Unsure of dc date at this time   [place] or [place]  Complete   Social Work Consult for [place] Planning/Counseling  Complete   Palliative Care Screening  Not Applicable   Medication Review Press photographer) Complete Complete Complete  PCP or Specialist appointment within 3-5 days of discharge Complete  Complete  HRI or Home Care Consult Complete  Complete  SW Recovery Care/Counseling Consult Complete  Complete   Palliative Care Screening Not Applicable  Complete  [place] Complete  Not Applicable

## 2022-04-06 NOTE — Evaluation (Addendum)
Occupational Therapy Evaluation Patient Details Name: [name] MRN: [id number] DOB: 08-06-1956 Today's Date: 04/06/2022   History of Present Illness 66 y/o female admitted secondary to likely withdrawal seizures. PMH includes seizures, alcohol abuse, L breast cancer s/p mastectomy, CKD, HTN, dCHF, and obesity.   Clinical Impression   PTA, pt was living with her granddaughter who assisted with LB ADLs and IADLs; pt using a RW for mobility. Pt currently requiring Min A for UB ADLs, Mod A for LB ADLs, and Min Guard A for functional mobility with RW. Pt demonstrating decreased processing, balance, and activity tolerance.  Planning for dc later today and defer further OT needs to [facility] services. Recommend dc to home with HHOT for further OT to optimize safety, independence with ADLs, and return to PLOF.      Recommendations for follow up therapy are one component of a multi-disciplinary discharge planning process, led by the attending physician.  Recommendations may be updated based on patient status, additional functional criteria and insurance authorization.   Follow Up Recommendations  Home health OT    Assistance Recommended at Discharge Frequent or constant Supervision/Assistance  Patient can return home with the following A little help with walking and/or transfers;A little help with bathing/dressing/bathroom    Functional Status Assessment  Patient has had a recent decline in their functional status and demonstrates the ability to make significant improvements in function in a reasonable and predictable amount of time.  Equipment Recommendations  None recommended by OT    Recommendations for Other Services       Precautions / Restrictions Precautions Precautions: Fall Restrictions Weight Bearing Restrictions: No      Mobility Bed Mobility Overal bed mobility: Needs Assistance Bed Mobility: Sit to Supine       Sit to supine: Mod assist   General bed mobility comments:  Mod A for elevated BLEs over EOB    Transfers Overall transfer level: Needs assistance Equipment used: Rolling walker (2 wheels) Transfers: Sit to/from Stand Sit to Stand: Min guard           General transfer comment: Min guard for safety.      Balance Overall balance assessment: Needs assistance Sitting-balance support: No upper extremity supported, Feet supported Sitting balance-Leahy Scale: Fair     Standing balance support: Bilateral upper extremity supported Standing balance-Leahy Scale: Poor Standing balance comment: REliant on UE support                           ADL either performed or assessed with clinical judgement   ADL Overall ADL's : Needs assistance/impaired Eating/Feeding: Set up;Sitting   Grooming: Wash/dry hands;Wash/dry face;Oral care;Min guard;Standing;Supervision/safety;Set up;Sitting Grooming Details (indicate cue type and reason): Pt washing her hands at sink with MIn Guard A. Fatigues quickly. Returing to EOB for seated rest break. Pt completing oral care and washing her face while seated at Eob with set up and supervision Upper Body Bathing: Minimal assistance;Sitting   Lower Body Bathing: Moderate assistance;Sit to/from stand   Upper Body Dressing : Set up;Supervision/safety;Sitting   Lower Body Dressing: Moderate assistance;Sit to/from stand   Toilet Transfer: Min guard;Ambulation;Rolling walker (2 wheels) (simulated in room)           Functional mobility during ADLs: Min guard;Rolling walker (2 wheels) General ADL Comments: Pt presenting with decreased balance and activity tolerance. However, feel pt is not far from baseline function     Vision Baseline Vision/History: 1 Wears glasses Patient Visual Report:  No change from baseline       Perception     Praxis      Pertinent Vitals/Pain Pain Assessment Pain Assessment: Faces Faces Pain Scale: Hurts little more Pain Location: bottom Pain Descriptors / Indicators:  Grimacing, Guarding Pain Intervention(s): Monitored during session, Repositioned     Hand Dominance     Extremity/Trunk Assessment Upper Extremity Assessment Upper Extremity Assessment: Generalized weakness   Lower Extremity Assessment Lower Extremity Assessment: Defer to PT evaluation   Cervical / Trunk Assessment Cervical / Trunk Assessment: Other exceptions Cervical / Trunk Exceptions: increased body habitus   Communication Communication Communication: No difficulties   Cognition Arousal/Alertness: Awake/alert Behavior During Therapy: WFL for tasks assessed/performed Overall Cognitive Status: No family/caregiver present to determine baseline cognitive functioning                                 General Comments: Requiring increased time for processing. Not family present to confirm baseline cognition. Feel pt isnt far from baseline.     General Comments  no family present    Exercises     Shoulder Instructions      Home Living Family/patient expects to be discharged to:: Private residence Living Arrangements: Other relatives (granddaughter) Available Help at Discharge: Family Type of Home: Apartment Home Access: Level entry     Home Layout: One level     Bathroom Shower/Tub: Teacher, early years/pre: Standard     Home Equipment: Conservation officer, nature (2 wheels);Cane - single point;Wheelchair - manual;Tub bench          Prior Functioning/Environment Prior Level of Function : Needs assist             Mobility Comments: Reports walking short distances with RW ADLs Comments: Reports granddaughter assists with LB dressing and IADLs        OT Problem List: Decreased strength;Decreased range of motion;Decreased activity tolerance;Impaired balance (sitting and/or standing);Decreased knowledge of use of DME or AE;Decreased knowledge of precautions      OT Treatment/Interventions:      OT Goals(Current goals can be found in the care  plan section) Acute Rehab OT Goals Patient Stated Goal: Go home OT Goal Formulation: All assessment and education complete, DC therapy  OT Frequency:      Co-evaluation              AM-PAC OT "6 Clicks" Daily Activity     Outcome Measure Help from another person eating meals?: A Little Help from another person taking care of personal grooming?: A Little Help from another person toileting, which includes using toliet, bedpan, or urinal?: A Little Help from another person bathing (including washing, rinsing, drying)?: A Lot Help from another person to put on and taking off regular upper body clothing?: A Little Help from another person to put on and taking off regular lower body clothing?: A Lot 6 Click Score: 16   End of Session Equipment Utilized During Treatment: Rolling walker (2 wheels) Nurse Communication: Mobility status  Activity Tolerance: Patient tolerated treatment well Patient left: in bed;with call bell/phone within reach;with bed alarm set  OT Visit Diagnosis: Unsteadiness on feet (R26.81);Other abnormalities of gait and mobility (R26.89);Muscle weakness (generalized) (M62.81)                Time: 1016-1030 OT Time Calculation (min): 14 min Charges:  OT General Charges $OT Visit: 1 Visit OT Evaluation $OT Eval Moderate Complexity: 1  Mod  Ash Mcelwain MSOT, OTR/L Acute Rehab Office: [place] 04/06/2022, 11:47 AM

## 2022-04-06 NOTE — Discharge Summary (Signed)
Physician Discharge Summary   Patient: [name] MRN: [id number] DOB: 24-Jan-1956  Admit date:     04/04/2022  Discharge date: 04/06/22  Discharge Physician: Patrecia Pour   PCP: [name], MD   Recommendations at discharge:  Follow up with PCP 1-2 weeks after discharge with suggested repeat BMP. Discuss medication management of chronic comorbidities. Continue alcohol abstinence counseling. Seizure precautions reviewed. Follow up with surgery, Dr. Ninfa Linden, 9/1 to discuss removal of thrombosed port.   Discharge Diagnoses: Principal Problem:   Seizure ([place]) Active Problems:   Alcohol dependence ([place])   Malignant neoplasm of upper-outer quadrant of left breast in female, estrogen receptor positive [facility])  Hospital Course: [name] s a 66 y.o. female with a history of alcohol abuse, HTN, chronic HFpEF, stage IIIa CKD, breast CA who presented to the ED 8/6 after family found her laying on the floor, unresponsive with facial twitching and drooling. She was agitated and brought in as code stroke. CT head did not reveal acute findings. EtOH level was negative, UDS only w/THC, negative UA, WBC 9.1k. The patient developed fever for which blood cultures were drawn, though no nidus of infection is currently noted. She was admitted 8/7 with neurology consulted with suspicion of alcohol withdrawal seizure, a recurrent presentation for her. After keppra load and ativan, her symptoms abated and into the following morning she has returned to her baseline with no evidence of ongoing seizures, postictal state, nor ongoing withdrawal. She has no focal deficits and is cleared for discharge. Please see details below.  Assessment and Plan: Alcohol withdrawal seizure - Appreciate neurology recommendations. Received keppra load but no further AEDs recommended. Seizure precautions reviewed. - Alcohol abstinence recommended - DC home tramadol which the patient was not regularly taking.   Acute  encephalopathy: Suspected to be due to alcohol withdrawal, seizure, and post ictal. Motion-degraded CT without overt abnormality. Ammonia normal.  - Patient has returned to normal mental baseline, has no focal deficits. Worked with PT and is safe to return home with home health therapies. No current suggestion of ongoing alcohol withdrawal.    Fever, concern for UTI: Isolated, has not recurred despite no antimicrobial Tx. No leukocytosis, no pyuria on urine dipstick, though urine culture growing GNRs. No suprapubic tenderness or concern for sepsis. CK normal, CXR without infiltrate. SARS-CoV-2 PCR negative.  - Will give fosfomycin to treat uncomplicated UTI. Monitor blood cultures (NGTD at time of discharge, only 1 aerobic bottle collected).    Chronic HFpEF, cardiomegaly on CXR, HTN: Does not appear volume overloaded at this time.  - Monitor volume status.  - Recommend pt restart home medications including coreg, norvasc, lasix and follow up with PCP.   AKI on stage IIIa CKD: Given small bolus, BP elevated.  - Monitor while avoiding nephrotoxins.    History of left breast CA: s/p modified radical left mastectomy April 2021, chemotherapy x2 cycles (due to poor tolerance), adjuvant radiation Sept 2021, anastrozole July 2021.  - F/u with Dr. Chryl Heck per routine.    Hypomagnesemia: Resolved with supplement  Hypokalemia: Supplement and recheck at follow up. Anticipate will improve now that diet is started back.   Chronic port thrombosis: Note marked terminal right IJ and innominate vein stenosis reflecting possible sequela of chronic port thrombosis.  - Per oncology notes, no plans to use the port. D/w surgery who will arrange follow up with Dr. Ninfa Linden to discuss removal.  - No evidence of infection or obstruction currently.   Morbid obesity: Body mass index is  56.57 kg/m.   Consultants: Neurology Procedures performed: EEG  Disposition: Home Diet recommendation:  Cardiac diet DISCHARGE  MEDICATION: Allergies as of 04/06/2022   No Known Allergies      Medication List     STOP taking these medications    traMADol 50 MG tablet Commonly known as: ULTRAM       TAKE these medications    albuterol 108 (90 Base) MCG/ACT inhaler Commonly known as: VENTOLIN HFA Inhale 2 puffs into the lungs every 6 (six) hours as needed for wheezing or shortness of breath.   amLODipine 10 MG tablet Commonly known as: NORVASC Take 1 tablet (10 mg total) by mouth daily. What changed: when to take this   BIOTIN PO Take 1 tablet by mouth daily.   carvedilol 25 MG tablet Commonly known as: COREG Take 1 tablet (25 mg total) by mouth 2 (two) times daily with a meal.   furosemide 20 MG tablet Commonly known as: LASIX Take 1 tablet (20 mg total) by mouth daily. Please make overdue appt with Dr. Gasper Sells before anymore refills. Thank you 1st attempt What changed:  when to take this additional instructions   gabapentin 300 MG capsule Commonly known as: NEURONTIN Take 1 capsule (300 mg total) by mouth at bedtime. What changed: when to take this   multivitamin with minerals Tabs tablet Take 1 tablet by mouth daily.   nystatin powder Commonly known as: MYCOSTATIN/NYSTOP Apply topically 3 (three) times daily. Apply to arms pits, fat folds, inter-trigone What changed:  how much to take when to take this reasons to take this   traZODone 50 MG tablet Commonly known as: DESYREL Take 50 mg by mouth at bedtime.   Vitamin D (Ergocalciferol) 1.25 MG (50000 UNIT) Caps capsule Commonly known as: DRISDOL Take 50,000 Units by mouth every Sunday.        Follow-up Information     [name], MD Follow up on 04/30/2022.   Specialty: General Surgery Why: 920am. Please arrive 30 minutes prior to your appointment for paperwork. Please bring a copy of your photo ID and insurance card. Contact information: [address] [place] [address] [phone]          [name], MD Follow up.   Specialty: Family Medicine Contact information: [place] [address] [phone]                Discharge Exam: [name] Weights   04/04/22 2100  Weight: 135.8 kg  BP (!) 155/85   Pulse (!) 58   Temp 98.5 F (36.9 C) (Oral)   Resp 19   Wt 135.8 kg   SpO2 99%   BMI 56.57 kg/m   Well-appearing, pleasant, alert, interactive female in no distress Clear, nonlabored RRR, rate in 60's, without pitting edema Soft, obese, no CVA tenderness to percussion No focal deficits, clear mentation, oriented x4. No tremor or asterixis.  Condition at discharge: stable  The results of significant diagnostics from this hospitalization (including imaging, microbiology, ancillary and laboratory) are listed below for reference.   Imaging Studies: EEG adult  Result Date: April 21, 2022 Lora Havens, MD     04/21/22  1:17 PM Patient Name: Novice Vrba MRN: [id number] Epilepsy Attending: Lora Havens Referring Physician/Provider: Janine Ores, NP Date: April 21, 2022 Duration: 27.34 mins Patient history: 66 y.o. female with PMH significant for breast cancer s/p mastectomy, hx of CKD 3, HTN, EtOh use with withdrawal seizures, morbid obesity, chronic diastolic CHF who presents to the hospital  confused, mild twtiching noted on the face along with drooling. EEG to evaluate for seizure. Level of alertness: Awake, asleep AEDs during EEG study: LEV, Ativan Technical aspects: This EEG study was done with scalp electrodes positioned according to the 10-20 International system of electrode placement. Electrical activity was reviewed with band pass filter of 1-'70Hz'$ , sensitivity of 7 uV/mm, display speed of 66m/sec with a '60Hz'$  notched filter applied as appropriate. EEG data were recorded continuously and digitally stored.  Video monitoring was available and reviewed as appropriate. Description: The posterior dominant rhythm consists of '8Hz'$  activity of moderate  voltage (25-35 uV) seen predominantly in posterior head regions, symmetric and reactive to eye opening and eye closing. Sleep was characterized by vertex waves, sleep spindles (12 to 14 Hz), maximal frontocentral region.  EEG showed intermittent generalized 3 to 6 Hz theta-delta slowing. Sharp transients were noted in left anterior-temporal region. Hyperventilation and photic stimulation were not performed.   ABNORMALITY - Intermittent slow, generalized IMPRESSION: This study is suggestive of mild diffuse encephalopathy, nonspecific etiology. No seizures or definite epileptiform discharges were seen throughout the recording. A normal interictal EEG does not exclude nor support the diagnosis of epilepsy. PLora Havens  DG Pelvis Portable  Result Date: 04/05/2022 CLINICAL DATA:  Code stroke. Found lying on floor. History of breast cancer. EXAM: PORTABLE PELVIS 1-2 VIEWS COMPARISON:  CT pelvis 06/10/2021 FINDINGS: Detailed assessment is limited by soft tissue attenuation from habitus. The cortical margins of the bony pelvis are intact. No evidence of fracture. Pubic symphysis and sacroiliac joints are congruent. Both femoral heads are well-seated in the respective acetabula. IMPRESSION: No evidence of pelvic fracture. Electronically Signed   By: MKeith RakeM.D.   On: 04/05/2022 03:36   DG Chest Port 1 View  Result Date: 04/04/2022 CLINICAL DATA:  Central line placement. EXAM: PORTABLE CHEST 1 VIEW COMPARISON:  Chest radiograph 02/05/2022, chest CT yesterday. FINDINGS: New left internal jugular central venous catheter tip overlies the brachiocephalic confluence. No pneumothorax. Right chest port remains in place. Lung volumes are low. Similar cardiomegaly to prior exam. Aortic atherosclerosis. No pleural effusion or acute airspace disease. Left axillary surgical clips. IMPRESSION: 1. New left internal jugular central venous catheter with tip overlying the brachiocephalic confluence. No pneumothorax. 2.  Unchanged cardiomegaly. Electronically Signed   By: MKeith RakeM.D.   On: 04/04/2022 22:50   CT HEAD CODE STROKE WO CONTRAST  Result Date: 04/04/2022 CLINICAL DATA:  Code stroke.  Acute neurologic deficit EXAM: CT HEAD WITHOUT CONTRAST TECHNIQUE: Contiguous axial images were obtained from the base of the skull through the vertex without intravenous contrast. RADIATION DOSE REDUCTION: This exam was performed according to the departmental dose-optimization program which includes automated exposure control, adjustment of the mA and/or kV according to patient size and/or use of iterative reconstruction technique. COMPARISON:  None Available. FINDINGS: The examination is severely degraded by motion. Within that limitation, there is no acute hemorrhage visualized. Brain volume appears to be appropriate for age. IMPRESSION: Severely degraded by motion. Within that limitation, there is no acute hemorrhage visualized. Electronically Signed   By: KUlyses JarredM.D.   On: 04/04/2022 22:19   CT Chest W Contrast  Result Date: 04/03/2022 CLINICAL DATA:  Left breast cancer, left upper extremity swelling EXAM: CT CHEST WITH CONTRAST TECHNIQUE: Multidetector CT imaging of the chest was performed during intravenous contrast administration. RADIATION DOSE REDUCTION: This exam was performed according to the departmental dose-optimization program which includes automated exposure control, adjustment of the mA  and/or kV according to patient size and/or use of iterative reconstruction technique. CONTRAST:  98m OMNIPAQUE IOHEXOL 300 MG/ML  SOLN COMPARISON:  None Available. FINDINGS: Cardiovascular: No significant coronary artery calcification. Mild cardiomegaly. No pericardial effusion. Central pulmonary arteries are of normal caliber. Mild atherosclerotic calcification within the thoracic aorta. No aortic aneurysm. Right subclavian chest port tip noted within the superior vena cava. Marked stenosis of the terminal right  internal jugular vein and innominate vein may reflect the sequela of chronic thrombosis. Mediastinum/Nodes: 10 mm left thyroid nodule. Not clinically significant; no follow-up imaging recommended (ref: J Am Coll Radiol. 2015 Feb;12(2): 143-50).The esophagus is unremarkable. Left axillary lymph node dissection has been performed. No pathologic thoracic adenopathy. Lungs/Pleura: Lungs are clear. No pleural effusion or pneumothorax. Upper Abdomen: No acute abnormality. Musculoskeletal: No acute bone abnormality. Remote appearing superior endplate fracture of T7 without significant loss of height. Remote appearing superior endplate fracture of TT55with approximately 40-50% loss of height. No retropulsion. Status post left mastectomy. IMPRESSION: 1. No acute intrathoracic pathology identified. No evidence of metastatic disease within the thorax. 2. Mild cardiomegaly. 3. Marked stenosis of the terminal right internal jugular vein and innominate vein surrounding the indwelling port catheter may reflect the sequela of chronic thrombosis. 4. Remote appearing superior endplate fractures of T7 and T12. Electronically Signed   By: AFidela SalisburyM.D.   On: 04/03/2022 00:57   UE VENOUS DUPLEX (7am - 7pm)  Result Date: 04/02/2022 UPPER VENOUS STUDY  Patient Name:  VLUGENE HITT Date of Exam:   04/02/2022 Medical Rec #: [id number]     Accession #:    [id number] of Birth: [birth year]    Patient Gender: F Patient Age:   89years [facility]      VAS [place]PPER EXTREMITY VENOUS DUPLEX Referring Phys: AMJAD ALI --------------------------------------------------------------------------------  Indications: Swelling Risk Factors: HX of left sided mastectomy. Limitations: Body habitus, poor ultrasound/tissue interface and scanning conditions (patient in wheelchair). Comparison Study: Previous exam on 05/31/20 was negative for DVT Performing Technologist: JRogelia RohrerRVT, RDMS  Examination Guidelines:  A complete evaluation includes B-mode imaging, spectral Doppler, color Doppler, and power Doppler as needed of all accessible portions of each vessel. Bilateral testing is considered an integral part of a complete examination. Limited examinations for reoccurring indications may be performed as noted.  Right Findings: +----------+------------+---------+-----------+----------+--------------------+ RIGHT     CompressiblePhasicitySpontaneousProperties      Summary        +----------+------------+---------+-----------+----------+--------------------+ Subclavian               Yes       Yes                   patent by                                                              color/doppler     +----------+------------+---------+-----------+----------+--------------------+  Left Findings: +----------+------------+---------+-----------+----------+--------------------+ LEFT      CompressiblePhasicitySpontaneousProperties      Summary        +----------+------------+---------+-----------+----------+--------------------+ IJV           Full       Yes       Yes                                   +----------+------------+---------+-----------+----------+--------------------+  Subclavian               Yes       Yes                   patent by                                                              color/doppler     +----------+------------+---------+-----------+----------+--------------------+ Axillary      Full       Yes       Yes                                   +----------+------------+---------+-----------+----------+--------------------+ Brachial      Full       Yes       Yes                                   +----------+------------+---------+-----------+----------+--------------------+ Radial        Full                                                       +----------+------------+---------+-----------+----------+--------------------+ Ulnar          Full                                                       +----------+------------+---------+-----------+----------+--------------------+ Cephalic      Full       Yes       Yes                                   +----------+------------+---------+-----------+----------+--------------------+ Basilic       Full       Yes       Yes                                   +----------+------------+---------+-----------+----------+--------------------+ Only very proximal portion of brachial veins visualized due to habitus  Summary:  Right: No evidence of thrombosis in the subclavian.  Left: No evidence of deep vein thrombosis in the upper extremity. No evidence of superficial vein thrombosis in the upper extremity. Diffuse subcutaneous edema in LUE.  *See table(s) above for measurements and observations.  Diagnosing physician: Deitra Mayo MD Electronically signed by Deitra Mayo MD on 04/02/2022 at 7:28:26 PM.    Final     Microbiology: Results for orders placed or performed during the hospital encounter of 04/04/22  Urine Culture     Status: Abnormal (Preliminary result)   Collection Time: 04/05/22  1:12 AM   Specimen: Urine, Clean Catch  Result Value Ref Range Status   Specimen Description URINE, CLEAN CATCH  Final  Special Requests NONE  Final   Culture (A)  Final    >=100,000 COLONIES/mL GRAM NEGATIVE RODS SUSCEPTIBILITIES TO FOLLOW Performed at [facility], [place] [address], [place], [place] [address]    Report Status PENDING  Incomplete  SARS Coronavirus 2 by RT PCR (hospital order, performed in [facility] hospital lab) *cepheid single result test* Anterior Nasal Swab     Status: None   Collection Time: 04/05/22  2:57 AM   Specimen: Anterior Nasal Swab  Result Value Ref Range Status   SARS Coronavirus 2 by RT PCR NEGATIVE NEGATIVE Final    Comment: (NOTE) SARS-CoV-2 target nucleic acids are NOT DETECTED.  The SARS-CoV-2 RNA is generally detectable in upper  and lower respiratory specimens during the acute phase of infection. The lowest concentration of SARS-CoV-2 viral copies this assay can detect is 250 copies / mL. A negative result does not preclude SARS-CoV-2 infection and should not be used as the sole basis for treatment or other patient management decisions.  A negative result may occur with improper specimen collection / handling, submission of specimen other than nasopharyngeal swab, presence of viral mutation(s) within the areas targeted by this assay, and inadequate number of viral copies (<250 copies / mL). A negative result must be combined with clinical observations, patient history, and epidemiological information.  Fact Sheet for Patients:   https://www.patel.info/  Fact Sheet for Healthcare Providers: https://hall.com/  This test is not yet approved or  cleared by the [place] FDA and has been authorized for detection and/or diagnosis of SARS-CoV-2 by FDA under an Emergency Use Authorization (EUA).  This EUA will remain in effect (meaning this test can be used) for the duration of the COVID-19 declaration under Section 564(b)(1) of the Act, 21 U.S.C. section 360bbb-3(b)(1), unless the authorization is terminated or revoked sooner.  Performed at [facility], [place] [address] [place], [place] [address]   Culture, blood (Routine X 2) w Reflex to ID Panel     Status: None (Preliminary result)   Collection Time: 04/05/22  5:02 AM   Specimen: BLOOD RIGHT HAND  Result Value Ref Range Status   Specimen Description BLOOD RIGHT HAND  Final   Special Requests AEROBIC BOTTLE ONLY Blood Culture adequate volume  Final   Culture   Final    NO GROWTH 1 DAY Performed at [facility], [place] [address], [place], [place] [address]    Report Status PENDING  Incomplete    Labs: CBC: Recent Labs  Lab 04/02/22 1625 04/04/22 2206 04/04/22 2210 04/05/22 0206 04/05/22 0303  04/06/22 0559  WBC 4.5 7.2  --  9.1  --  5.2  NEUTROABS  --  5.7  --  6.4  --  2.4  HGB 10.8* 10.9* 11.9* 10.1* 10.9* 9.7*  HCT 34.3* 33.3* 35.0* 30.9* 32.0* 29.8*  MCV 94.0 91.0  --  90.4  --  90.3  PLT 198 193  --  186  --  353   Basic Metabolic Panel: Recent Labs  Lab 04/02/22 1625 04/04/22 2206 04/04/22 2210 04/05/22 0206 04/05/22 0303 04/06/22 0559  NA 141 137 138 139 140 138  K 3.8 4.0 4.0 3.5 3.6 3.3*  CL 107 104 104 106  --  104  CO2 27 22  --  24  --  25  GLUCOSE 111* 155* 146* 106*  --  92  BUN 30* 22 24* 20  --  17  CREATININE 1.59* 1.80* 1.80* 1.64*  --  1.65*  CALCIUM 9.4 9.2  --  8.9  --  9.1  MG  --   --   --  1.6*  --  1.9   Liver Function Tests: Recent Labs  Lab 04/04/22 2206 04/05/22 0206 04/06/22 0559  AST '18 15 16  '$ ALT '13 10 9  '$ ALKPHOS 53 49 44  BILITOT 0.7 0.7 0.4  PROT 8.1 7.4 7.1  ALBUMIN 3.8 3.4* 3.2*   CBG: Recent Labs  Lab 04/05/22 0043 04/05/22 0630 04/05/22 1431 04/06/22 0058 04/06/22 0421  GLUCAP 96 96 91 105* 91    Discharge time spent: greater than 30 minutes.  Signed: Patrecia Pour, MD [facility] 04/06/2022

## 2022-04-06 NOTE — Progress Notes (Signed)
HOB less than 45*. Pt held breath upon line removal. Pressure held for 8 min with no s/sx of bleeding, pressure drsg applied. Instructed pt to remain in bed for 30 min, monitor and report any s/sx of bleeding, and to keep drsg CDI for 24 hours. Pt VU. Nurse notified of line removal. Fran Lowes, RN VAST

## 2022-04-06 NOTE — Evaluation (Signed)
Physical Therapy Evaluation Patient Details Name: [name] MRN: [id number] DOB: 1956/04/21 Today's Date: 04/06/2022  History of Present Illness  Pt is a 66 y/o female admitted secondary to likely withdrawal seizures. PMH includes seizures, alcohol abuse, L breast cancer s/p mastectomy, CKD, HTN, dCHF, and obesity.  Clinical Impression  Pt admitted secondary to problem above with deficits below. Pt requiring min guard A for transfers and gait to the bathroom and back to bed. Pt with increased fatigue which limited mobility tolerance. Pt reports she lives with her granddaughter who assists with some ADL tasks. Feel pt would benefit from HHPT to address current deficits. Will continue to follow acutely.        Recommendations for follow up therapy are one component of a multi-disciplinary discharge planning process, led by the attending physician.  Recommendations may be updated based on patient status, additional functional criteria and insurance authorization.  Follow Up Recommendations Home health PT      Assistance Recommended at Discharge Intermittent Supervision/Assistance  Patient can return home with the following  A little help with walking and/or transfers;A little help with bathing/dressing/bathroom;Assistance with cooking/housework;Assist for transportation    Equipment Recommendations None recommended by PT  Recommendations for Other Services       Functional Status Assessment Patient has had a recent decline in their functional status and demonstrates the ability to make significant improvements in function in a reasonable and predictable amount of time.     Precautions / Restrictions Precautions Precautions: Fall Restrictions Weight Bearing Restrictions: No      Mobility  Bed Mobility               General bed mobility comments: sitting EOB with NT upon entry    Transfers Overall transfer level: Needs assistance Equipment used: Rolling walker (2  wheels) Transfers: Sit to/from Stand Sit to Stand: Min guard           General transfer comment: Min guard for safety. Able to stand for clean up following bowel incontinence. Able to stand again for clean up following toileting tasks    Ambulation/Gait Ambulation/Gait assistance: Min guard Gait Distance (Feet): 15 Feet (X2) Assistive device: Rolling walker (2 wheels) Gait Pattern/deviations: Step-through pattern, Decreased stride length Gait velocity: Decreased     General Gait Details: Very slow, labored steps. Increased fatigue limited ambulation distance to bathroom and back to bed. Min guard A for steadying.  Stairs            Wheelchair Mobility    Modified Rankin (Stroke Patients Only)       Balance Overall balance assessment: Needs assistance Sitting-balance support: No upper extremity supported, Feet supported Sitting balance-Leahy Scale: Fair     Standing balance support: Bilateral upper extremity supported Standing balance-Leahy Scale: Poor Standing balance comment: REliant on UE support                             Pertinent Vitals/Pain Pain Assessment Pain Assessment: Faces Faces Pain Scale: Hurts little more Pain Location: bottom Pain Descriptors / Indicators: Grimacing, Guarding Pain Intervention(s): Monitored during session, Limited activity within patient's tolerance, Repositioned    Home Living Family/patient expects to be discharged to:: Private residence Living Arrangements: Other relatives (granddaughter) Available Help at Discharge: Family Type of Home: Apartment Home Access: Level entry       Home Layout: One level Home Equipment: Conservation officer, nature (2 wheels);Cane - single point;Wheelchair - manual;Tub bench  Prior Function Prior Level of Function : Needs assist             Mobility Comments: Reports walking short distances with RW ADLs Comments: Reports granddaughter assists with LB dressing.     Hand  Dominance        Extremity/Trunk Assessment   Upper Extremity Assessment Upper Extremity Assessment: Defer to OT evaluation (increased swelling in LUE)    Lower Extremity Assessment Lower Extremity Assessment: Generalized weakness    Cervical / Trunk Assessment Cervical / Trunk Assessment: Other exceptions Cervical / Trunk Exceptions: increased body habitus  Communication   Communication: No difficulties  Cognition Arousal/Alertness: Awake/alert Behavior During Therapy: WFL for tasks assessed/performed Overall Cognitive Status: No family/caregiver present to determine baseline cognitive functioning                                 General Comments: Slow processing noted.        General Comments General comments (skin integrity, edema, etc.): no family present    Exercises     Assessment/Plan    PT Assessment Patient needs continued PT services  PT Problem List Decreased strength;Decreased activity tolerance;Decreased balance;Decreased mobility;Decreased safety awareness;Decreased knowledge of use of DME;Decreased cognition       PT Treatment Interventions DME instruction;Gait training;Functional mobility training;Therapeutic activities;Therapeutic exercise;Balance training;Patient/family education    PT Goals (Current goals can be found in the Care Plan section)  Acute Rehab PT Goals Patient Stated Goal: to go home PT Goal Formulation: With patient Time For Goal Achievement: 04/20/22 Potential to Achieve Goals: Good    Frequency Min 3X/week     Co-evaluation               AM-PAC PT "6 Clicks" Mobility  Outcome Measure Help needed turning from your back to your side while in a flat bed without using bedrails?: A Little Help needed moving from lying on your back to sitting on the side of a flat bed without using bedrails?: A Little Help needed moving to and from a bed to a chair (including a wheelchair)?: A Little Help needed standing up  from a chair using your arms (e.g., wheelchair or bedside chair)?: A Little Help needed to walk in hospital room?: A Little Help needed climbing 3-5 steps with a railing? : A Lot 6 Click Score: 17    End of Session   Activity Tolerance: Patient limited by fatigue Patient left: in bed;with call bell/phone within reach;Other (comment) (sitting EOB with OT present) Nurse Communication: Mobility status PT Visit Diagnosis: Unsteadiness on feet (R26.81);Muscle weakness (generalized) (M62.81)    Time: 0932-3557 PT Time Calculation (min) (ACUTE ONLY): 23 min   Charges:   PT Evaluation $PT Eval Moderate Complexity: 1 Mod PT Treatments $Therapeutic Activity: 8-22 mins        Lou Miner, DPT  Acute Rehabilitation Services  Office: [phone]   Rudean Hitt 04/06/2022, 10:31 AM

## 2022-04-07 LAB — URINE CULTURE: Culture: >=100000 — AB

## 2022-04-10 LAB — CULTURE, BLOOD (ROUTINE X 2)
Culture: NO GROWTH
Special Requests: ADEQUATE

## 2022-04-12 ENCOUNTER — Encounter: Payer: Self-pay | Admitting: Oncology

## 2022-06-07 ENCOUNTER — Telehealth: Payer: Self-pay | Admitting: Hematology and Oncology

## 2022-06-07 NOTE — Telephone Encounter (Signed)
Contacted patient to scheduled appointments. Left message with appointment details and a call back number if patient had any questions or could not accommodate the time we provided.   

## 2022-06-23 ENCOUNTER — Other Ambulatory Visit: Payer: Self-pay

## 2022-06-23 ENCOUNTER — Inpatient Hospital Stay (HOSPITAL_COMMUNITY)
Admission: EM | Admit: 2022-06-23 | Discharge: 2022-06-29 | DRG: 101 | Disposition: A | Discharge status: Home Health | Payer: Medicare Other | Attending: Internal Medicine | Admitting: Internal Medicine

## 2022-06-23 ENCOUNTER — Emergency Department (HOSPITAL_COMMUNITY): Payer: Medicare Other

## 2022-06-23 ENCOUNTER — Encounter (HOSPITAL_COMMUNITY): Payer: Self-pay | Admitting: Emergency Medicine

## 2022-06-23 DIAGNOSIS — R569 Unspecified convulsions: Principal | ICD-10-CM

## 2022-06-23 DIAGNOSIS — G4089 Other seizures: Secondary | ICD-10-CM | POA: Diagnosis not present

## 2022-06-23 DIAGNOSIS — K7460 Unspecified cirrhosis of liver: Secondary | ICD-10-CM | POA: Diagnosis present

## 2022-06-23 DIAGNOSIS — Z853 Personal history of malignant neoplasm of breast: Secondary | ICD-10-CM

## 2022-06-23 DIAGNOSIS — Z823 Family history of stroke: Secondary | ICD-10-CM

## 2022-06-23 DIAGNOSIS — Z6841 Body Mass Index (BMI) 40.0 and over, adult: Secondary | ICD-10-CM

## 2022-06-23 DIAGNOSIS — F10939 Alcohol use, unspecified with withdrawal, unspecified: Secondary | ICD-10-CM

## 2022-06-23 DIAGNOSIS — G9341 Metabolic encephalopathy: Secondary | ICD-10-CM | POA: Diagnosis present

## 2022-06-23 DIAGNOSIS — Z79899 Other long term (current) drug therapy: Secondary | ICD-10-CM

## 2022-06-23 DIAGNOSIS — Z8249 Family history of ischemic heart disease and other diseases of the circulatory system: Secondary | ICD-10-CM

## 2022-06-23 DIAGNOSIS — I130 Hypertensive heart and chronic kidney disease with heart failure and stage 1 through stage 4 chronic kidney disease, or unspecified chronic kidney disease: Secondary | ICD-10-CM | POA: Diagnosis present

## 2022-06-23 DIAGNOSIS — Z170 Estrogen receptor positive status [ER+]: Secondary | ICD-10-CM

## 2022-06-23 DIAGNOSIS — F10239 Alcohol dependence with withdrawal, unspecified: Secondary | ICD-10-CM | POA: Diagnosis present

## 2022-06-23 DIAGNOSIS — C50412 Malignant neoplasm of upper-outer quadrant of left female breast: Secondary | ICD-10-CM

## 2022-06-23 DIAGNOSIS — D631 Anemia in chronic kidney disease: Secondary | ICD-10-CM | POA: Diagnosis present

## 2022-06-23 DIAGNOSIS — E876 Hypokalemia: Secondary | ICD-10-CM | POA: Diagnosis not present

## 2022-06-23 DIAGNOSIS — N1832 Chronic kidney disease, stage 3b: Secondary | ICD-10-CM | POA: Diagnosis present

## 2022-06-23 DIAGNOSIS — Z781 Physical restraint status: Secondary | ICD-10-CM

## 2022-06-23 DIAGNOSIS — D638 Anemia in other chronic diseases classified elsewhere: Secondary | ICD-10-CM | POA: Diagnosis present

## 2022-06-23 DIAGNOSIS — I10 Essential (primary) hypertension: Secondary | ICD-10-CM | POA: Diagnosis present

## 2022-06-23 DIAGNOSIS — Z833 Family history of diabetes mellitus: Secondary | ICD-10-CM

## 2022-06-23 DIAGNOSIS — G9389 Other specified disorders of brain: Secondary | ICD-10-CM | POA: Diagnosis present

## 2022-06-23 DIAGNOSIS — Z1152 Encounter for screening for COVID-19: Secondary | ICD-10-CM

## 2022-06-23 DIAGNOSIS — Z8041 Family history of malignant neoplasm of ovary: Secondary | ICD-10-CM

## 2022-06-23 DIAGNOSIS — Z9012 Acquired absence of left breast and nipple: Secondary | ICD-10-CM

## 2022-06-23 DIAGNOSIS — B1910 Unspecified viral hepatitis B without hepatic coma: Secondary | ICD-10-CM | POA: Diagnosis present

## 2022-06-23 LAB — CBC WITH DIFFERENTIAL/PLATELET
Abs Immature Granulocytes: 0.02 10*3/uL (ref 0.00–0.07)
Basophils Absolute: 0 10*3/uL (ref 0.0–0.1)
Basophils Relative: 0 %
Eosinophils Absolute: 0.1 10*3/uL (ref 0.0–0.5)
Eosinophils Relative: 2 %
HCT: 32.6 % — ABNORMAL LOW (ref 36.0–46.0)
Hemoglobin: 10.7 g/dL — ABNORMAL LOW (ref 12.0–15.0)
Immature Granulocytes: 0 %
Lymphocytes Relative: 15 %
Lymphs Abs: 0.9 10*3/uL (ref 0.7–4.0)
MCH: 29.6 pg (ref 26.0–34.0)
MCHC: 32.8 g/dL (ref 30.0–36.0)
MCV: 90.3 fL (ref 80.0–100.0)
Monocytes Absolute: 0.5 10*3/uL (ref 0.1–1.0)
Monocytes Relative: 7 %
Neutro Abs: 4.9 10*3/uL (ref 1.7–7.7)
Neutrophils Relative %: 76 %
Platelets: 212 10*3/uL (ref 150–400)
RBC: 3.61 MIL/uL — ABNORMAL LOW (ref 3.87–5.11)
RDW: 13.2 % (ref 11.5–15.5)
WBC: 6.5 10*3/uL (ref 4.0–10.5)
nRBC: 0 % (ref 0.0–0.2)

## 2022-06-23 LAB — CBG MONITORING, ED: Glucose-Capillary: 122 mg/dL — ABNORMAL HIGH (ref 70–99)

## 2022-06-23 MED ORDER — LORAZEPAM 2 MG/ML IJ SOLN
1.0000 mg | INTRAMUSCULAR | Status: AC | PRN
  Administered 2022-06-24 (×2): 2 mg via INTRAVENOUS
  Administered 2022-06-25: 1 mg via INTRAVENOUS
  Filled 2022-06-23 (×3): qty 1

## 2022-06-23 MED ORDER — THIAMINE MONONITRATE 100 MG PO TABS: 100.0000 mg | ORAL_TABLET | Freq: Every day | ORAL | Status: DC

## 2022-06-23 MED ORDER — ADULT MULTIVITAMIN W/MINERALS CH
1.0000 | ORAL_TABLET | Freq: Every day | ORAL | Status: DC
  Administered 2022-06-25 – 2022-06-27 (×3): 1 via ORAL
  Filled 2022-06-23 (×3): qty 1

## 2022-06-23 MED ORDER — LORAZEPAM 1 MG PO TABS
1.0000 mg | ORAL_TABLET | ORAL | Status: AC | PRN
  Administered 2022-06-25: 2 mg via ORAL
  Filled 2022-06-23: qty 2
  Filled 2022-06-23: qty 1

## 2022-06-23 MED ORDER — THIAMINE HCL 100 MG/ML IJ SOLN: 100.0000 mg | Freq: Every day | INTRAMUSCULAR | Status: DC

## 2022-06-23 MED ORDER — FOLIC ACID 1 MG PO TABS
1.0000 mg | ORAL_TABLET | Freq: Every day | ORAL | Status: DC
  Administered 2022-06-25 – 2022-06-29 (×5): 1 mg via ORAL
  Filled 2022-06-23 (×5): qty 1

## 2022-06-23 NOTE — Progress Notes (Signed)
TOC CSW went to consult with pt.  Pt is unable to consult at this time.  CSW will try at a later time.  Margie Brink Tarpley-Carter, MSW, LCSW-A Pronouns:  She/Her/Hers [facility] HealthTransitions of Care Clinical Social Worker Direct Number:  [phone] [email or web address]

## 2022-06-23 NOTE — ED Triage Notes (Addendum)
Patient arrives via EMS after two seizures tonight, one grand mal type witnessed by EMS. CBG 111 with EMS. Was found on a couch so no evidence of fall. Hx of seizures d/t alcohol withdrawal. Unknown last drink. EMS gave '5MG'$  midazolam, initally placed and NPA and was bagging via BVM. Arrives on nonrebeather, transitioned to room air. Awake and attempting to assume position of comfort, not following commands, vocalizes/withdrawals from pain. EDP at bedside during EMS report.

## 2022-06-23 NOTE — ED Provider Notes (Signed)
[facility] EMERGENCY DEPARTMENT Provider Note   CSN: [id number] Arrival date & time: 06/23/22  2213     History  Chief Complaint  Patient presents with   Seizures    Alcohol withdrawal seizures    [name] is a 66 y.o. female.  Patient is a 66 year old female with a past medical history of alcohol withdrawal seizures, hypertension, CHF, CKD presenting to the emergency department after a seizure.  911 was called by the family when she was having a seizure on the couch.  Medics state on their arrival she was postictal appearing and still on the couch and had no signs of trauma.  They state once they loaded her into the truck she had a generalized tonic-clonic seizure.  They gave her 5 mg of Versed with cessation of her seizures.  They state that she did become intermittently apneic after this and did require bagging in route but was awake upon their arrival to the emergency department.  The family reported the they were encouraging that she stopped drinking but had noticed about a month ago that she was sneaking alcohol so they are unsure if she is still been drinking or when her last drink was.  The history is provided by the EMS personnel.  Seizures      Home Medications Prior to Admission medications   Medication Sig Start Date End Date Taking? Authorizing Provider  albuterol (VENTOLIN HFA) 108 (90 Base) MCG/ACT inhaler Inhale 2 puffs into the lungs every 6 (six) hours as needed for wheezing or shortness of breath. Patient not taking: Reported on 02/05/2022 09/12/21 09/12/22  [name], Corrie Mckusick, MD  amLODipine (NORVASC) 10 MG tablet Take 1 tablet (10 mg total) by mouth daily. Patient taking differently: Take 10 mg by mouth every morning. 09/12/21   [name], [name], MD  BIOTIN PO Take 1 tablet by mouth daily.    [provider]  carvedilol (COREG) 25 MG tablet Take 1 tablet (25 mg total) by mouth 2 (two) times daily with a meal. 02/07/22 03/09/22  Pahwani,  Michell Heinrich, MD  furosemide (LASIX) 20 MG tablet Take 1 tablet (20 mg total) by mouth daily. Please make overdue appt with Dr. Gasper Sells before anymore refills. Thank you 1st attempt Patient taking differently: Take 20 mg by mouth every morning. 06/03/21   Chandrasekhar, Lyda Kalata A, MD  gabapentin (NEURONTIN) 300 MG capsule Take 1 capsule (300 mg total) by mouth at bedtime. Patient taking differently: Take 300 mg by mouth 2 (two) times daily. 07/14/20   Magrinat, Virgie Dad, MD  Multiple Vitamin (MULTIVITAMIN WITH MINERALS) TABS tablet Take 1 tablet by mouth daily. 02/08/22   Pahwani, Michell Heinrich, MD  nystatin (MYCOSTATIN/NYSTOP) powder Apply topically 3 (three) times daily. Apply to arms pits, fat folds, inter-trigone Patient taking differently: Apply 1 application  topically 3 (three) times daily as needed (itching/rash). Apply to arms pits, fat folds, inter-trigone 09/12/21   [name], [name], MD  traZODone (DESYREL) 50 MG tablet Take 50 mg by mouth at bedtime. 01/18/22   [provider]  Vitamin D, Ergocalciferol, (DRISDOL) 1.25 MG (50000 UNIT) CAPS capsule Take 50,000 Units by mouth every Sunday. 10/30/21   [provider]      Allergies    Patient has no known allergies.    Review of Systems   Review of Systems  Neurological:  Positive for seizures.    Physical Exam Updated Vital Signs BP (!) 157/81 (BP Location: Right Arm)   Pulse 88   Temp 98.8  F (37.1 C)   Resp 17   SpO2 97%  Physical Exam Vitals and nursing note reviewed.  Constitutional:      General: She is not in acute distress.    Appearance: She is obese. She is not diaphoretic.     Comments: Postictal appearing, rolling around in bed unable to provide any history  HENT:     Head: Normocephalic and atraumatic.     Nose: Nose normal.     Mouth/Throat:     Mouth: Mucous membranes are moist.     Pharynx: Oropharynx is clear.  Eyes:     Extraocular Movements: Extraocular movements intact.      Conjunctiva/sclera: Conjunctivae normal.     Pupils: Pupils are equal, round, and reactive to light.  Cardiovascular:     Rate and Rhythm: Normal rate and regular rhythm.     Pulses: Normal pulses.     Heart sounds: Normal heart sounds.  Pulmonary:     Effort: Pulmonary effort is normal.     Breath sounds: Normal breath sounds.  Abdominal:     General: Abdomen is flat.     Palpations: Abdomen is soft.     Tenderness: There is no abdominal tenderness.  Musculoskeletal:        General: Normal range of motion.     Cervical back: Normal range of motion and neck supple.     Right lower leg: No edema.     Left lower leg: No edema.  Skin:    General: Skin is warm and dry.  Neurological:     Mental Status: She is alert. She is disoriented.     Sensory: No sensory deficit.     Motor: No weakness.  Psychiatric:        Mood and Affect: Mood normal.        Behavior: Behavior normal.     ED Results / Procedures / Treatments   Labs (all labs ordered are listed, but only abnormal results are displayed) Labs Reviewed  CBG MONITORING, ED - Abnormal; Notable for the following components:      Result Value   Glucose-Capillary 122 (*)    All other components within normal limits  COMPREHENSIVE METABOLIC PANEL  ETHANOL  CBC WITH DIFFERENTIAL/PLATELET  MAGNESIUM  PHOSPHORUS    EKG None  Radiology DG Chest 1 View  Result Date: 06/23/2022 CLINICAL DATA:  Seizures, hypoxia. EXAM: CHEST  1 VIEW COMPARISON:  Chest radiograph dated April 04, 2022. FINDINGS: The heart is enlarged. Right IJ access MediPort with distal tip in the SVC. Low lung volumes with bibasilar atelectasis. No large pleural effusion or pneumothorax. Multiple surgical clips overlying the left axilla. No acute osseous abnormality. IMPRESSION: 1. Low lung volumes with bibasilar atelectasis. 2. Cardiomegaly. 3. Right IJ access MediPort with distal tip in the SVC. Electronically Signed   By: Keane Police D.O.   On: 06/23/2022  22:41    Procedures Procedures    Medications Ordered in ED Medications  LORazepam (ATIVAN) tablet 1-4 mg (has no administration in time range)    Or  LORazepam (ATIVAN) injection 1-4 mg (has no administration in time range)  thiamine (VITAMIN B1) tablet 100 mg (has no administration in time range)    Or  thiamine (VITAMIN B1) injection 100 mg (has no administration in time range)  folic acid (FOLVITE) tablet 1 mg (has no administration in time range)  multivitamin with minerals tablet 1 tablet (has no administration in time range)    ED Course/ Medical  Decision Making/ A&P Clinical Course as of 06/23/22 2343  Wed Jun 23, 2022  2343 Patient signed out to Dr. Christy Gentles pending labs and reassessment with plan for likely admission for ETOH withdrawal seizures. [VK]    Clinical Course User Index [VK] Kemper Durie, DO                           Medical Decision Making This patient presents to the ED with chief complaint(s) of seizure with pertinent past medical history of alcohol withdrawal seizures, hypertension, CHF, CKD which further complicates the presenting complaint. The complaint involves an extensive differential diagnosis and also carries with it a high risk of complications and morbidity.    The differential diagnosis includes alcohol withdrawal seizure, hypo or hyperglycemia, electrolyte abnormality, ICH or mass effect unlikely as patient had no trauma or falls and no lateralizing symptoms  Additional history obtained: Additional history obtained from EMS  Records reviewed previous admission documents  ED Course and Reassessment: Upon patient's arrival to the emergency department she was awake but altered and postictal appearing satting well on room air.  She will have Accu-Chek, EKG and labs performed and will be placed on the CIWA protocol for concern for alcohol withdrawal seizure and will be closely reassessed.  Independent labs interpretation:  The following  labs were independently interpreted: pending  Independent visualization of imaging: - I independently visualized the following imaging with scope of interpretation limited to determining acute life threatening conditions related to emergency care: CXR, which revealed no acute disease     Amount and/or Complexity of Data Reviewed Labs: ordered. Radiology: ordered.  Risk OTC drugs. Prescription drug management.          Final Clinical Impression(s) / ED Diagnoses Final diagnoses:  None    Rx / DC Orders ED Discharge Orders     None         Kemper Durie, DO 06/23/22 2343

## 2022-06-24 ENCOUNTER — Observation Stay (HOSPITAL_COMMUNITY): Payer: Medicare Other

## 2022-06-24 ENCOUNTER — Inpatient Hospital Stay (HOSPITAL_COMMUNITY): Payer: Medicare Other

## 2022-06-24 DIAGNOSIS — Z9012 Acquired absence of left breast and nipple: Secondary | ICD-10-CM | POA: Diagnosis not present

## 2022-06-24 DIAGNOSIS — Z170 Estrogen receptor positive status [ER+]: Secondary | ICD-10-CM

## 2022-06-24 DIAGNOSIS — F10931 Alcohol use, unspecified with withdrawal delirium: Secondary | ICD-10-CM | POA: Diagnosis not present

## 2022-06-24 DIAGNOSIS — N1832 Chronic kidney disease, stage 3b: Secondary | ICD-10-CM

## 2022-06-24 DIAGNOSIS — Z833 Family history of diabetes mellitus: Secondary | ICD-10-CM | POA: Diagnosis not present

## 2022-06-24 DIAGNOSIS — C50412 Malignant neoplasm of upper-outer quadrant of left female breast: Secondary | ICD-10-CM

## 2022-06-24 DIAGNOSIS — Z8249 Family history of ischemic heart disease and other diseases of the circulatory system: Secondary | ICD-10-CM | POA: Diagnosis not present

## 2022-06-24 DIAGNOSIS — B1910 Unspecified viral hepatitis B without hepatic coma: Secondary | ICD-10-CM | POA: Diagnosis present

## 2022-06-24 DIAGNOSIS — F10239 Alcohol dependence with withdrawal, unspecified: Secondary | ICD-10-CM | POA: Diagnosis present

## 2022-06-24 DIAGNOSIS — I130 Hypertensive heart and chronic kidney disease with heart failure and stage 1 through stage 4 chronic kidney disease, or unspecified chronic kidney disease: Secondary | ICD-10-CM | POA: Diagnosis present

## 2022-06-24 DIAGNOSIS — K7030 Alcoholic cirrhosis of liver without ascites: Secondary | ICD-10-CM | POA: Diagnosis not present

## 2022-06-24 DIAGNOSIS — Z781 Physical restraint status: Secondary | ICD-10-CM | POA: Diagnosis not present

## 2022-06-24 DIAGNOSIS — Z823 Family history of stroke: Secondary | ICD-10-CM | POA: Diagnosis not present

## 2022-06-24 DIAGNOSIS — G9389 Other specified disorders of brain: Secondary | ICD-10-CM | POA: Diagnosis present

## 2022-06-24 DIAGNOSIS — R569 Unspecified convulsions: Secondary | ICD-10-CM | POA: Diagnosis present

## 2022-06-24 DIAGNOSIS — Z1152 Encounter for screening for COVID-19: Secondary | ICD-10-CM | POA: Diagnosis not present

## 2022-06-24 DIAGNOSIS — R609 Edema, unspecified: Secondary | ICD-10-CM | POA: Diagnosis not present

## 2022-06-24 DIAGNOSIS — D638 Anemia in other chronic diseases classified elsewhere: Secondary | ICD-10-CM

## 2022-06-24 DIAGNOSIS — D631 Anemia in chronic kidney disease: Secondary | ICD-10-CM | POA: Diagnosis present

## 2022-06-24 DIAGNOSIS — E512 Wernicke's encephalopathy: Secondary | ICD-10-CM | POA: Diagnosis not present

## 2022-06-24 DIAGNOSIS — K7460 Unspecified cirrhosis of liver: Secondary | ICD-10-CM | POA: Diagnosis present

## 2022-06-24 DIAGNOSIS — Z79899 Other long term (current) drug therapy: Secondary | ICD-10-CM | POA: Diagnosis not present

## 2022-06-24 DIAGNOSIS — E876 Hypokalemia: Secondary | ICD-10-CM | POA: Diagnosis not present

## 2022-06-24 DIAGNOSIS — M7989 Other specified soft tissue disorders: Secondary | ICD-10-CM | POA: Diagnosis not present

## 2022-06-24 DIAGNOSIS — Z8041 Family history of malignant neoplasm of ovary: Secondary | ICD-10-CM | POA: Diagnosis not present

## 2022-06-24 DIAGNOSIS — Z853 Personal history of malignant neoplasm of breast: Secondary | ICD-10-CM | POA: Diagnosis not present

## 2022-06-24 DIAGNOSIS — Z6841 Body Mass Index (BMI) 40.0 and over, adult: Secondary | ICD-10-CM | POA: Diagnosis not present

## 2022-06-24 DIAGNOSIS — G4089 Other seizures: Secondary | ICD-10-CM | POA: Diagnosis present

## 2022-06-24 LAB — CBC
HCT: 32.4 % — ABNORMAL LOW (ref 36.0–46.0)
Hemoglobin: 10.8 g/dL — ABNORMAL LOW (ref 12.0–15.0)
MCH: 29.6 pg (ref 26.0–34.0)
MCHC: 33.3 g/dL (ref 30.0–36.0)
MCV: 88.8 fL (ref 80.0–100.0)
Platelets: 206 10*3/uL (ref 150–400)
RBC: 3.65 MIL/uL — ABNORMAL LOW (ref 3.87–5.11)
RDW: 13.2 % (ref 11.5–15.5)
WBC: 6.9 10*3/uL (ref 4.0–10.5)
nRBC: 0 % (ref 0.0–0.2)

## 2022-06-24 LAB — COMPREHENSIVE METABOLIC PANEL
ALT: 6 U/L (ref 0–44)
ALT: 8 U/L (ref 0–44)
AST: 20 U/L (ref 15–41)
AST: 18 U/L (ref 15–41)
Albumin: 3.2 g/dL — ABNORMAL LOW (ref 3.5–5.0)
Albumin: 3.2 g/dL — ABNORMAL LOW (ref 3.5–5.0)
Alkaline Phosphatase: 43 U/L (ref 38–126)
Alkaline Phosphatase: 41 U/L (ref 38–126)
Anion gap: 16 — ABNORMAL HIGH (ref 5–15)
Anion gap: 15 (ref 5–15)
BUN: 15 mg/dL (ref 8–23)
BUN: 14 mg/dL (ref 8–23)
CO2: 23 mmol/L (ref 22–32)
CO2: 24 mmol/L (ref 22–32)
Calcium: 8.9 mg/dL (ref 8.9–10.3)
Calcium: 9.1 mg/dL (ref 8.9–10.3)
Chloride: 98 mmol/L (ref 98–111)
Chloride: 97 mmol/L — ABNORMAL LOW (ref 98–111)
Creatinine, Ser: 1.82 mg/dL — ABNORMAL HIGH (ref 0.44–1.00)
Creatinine, Ser: 1.72 mg/dL — ABNORMAL HIGH (ref 0.44–1.00)
GFR, Estimated: 30 mL/min — ABNORMAL LOW (ref >60)
GFR, Estimated: 32 mL/min — ABNORMAL LOW (ref >60)
Glucose, Bld: 130 mg/dL — ABNORMAL HIGH (ref 70–99)
Glucose, Bld: 105 mg/dL — ABNORMAL HIGH (ref 70–99)
Potassium: 3.5 mmol/L (ref 3.5–5.1)
Potassium: 3.4 mmol/L — ABNORMAL LOW (ref 3.5–5.1)
Sodium: 137 mmol/L (ref 135–145)
Sodium: 136 mmol/L (ref 135–145)
Total Bilirubin: 1 mg/dL (ref 0.3–1.2)
Total Bilirubin: 1.2 mg/dL (ref 0.3–1.2)
Total Protein: 7.5 g/dL (ref 6.5–8.1)
Total Protein: 7.6 g/dL (ref 6.5–8.1)

## 2022-06-24 LAB — MAGNESIUM: Magnesium: 1.6 mg/dL — ABNORMAL LOW (ref 1.7–2.4)

## 2022-06-24 LAB — TSH: TSH: 1.061 u[IU]/mL (ref 0.350–4.500)

## 2022-06-24 LAB — VITAMIN B12: Vitamin B-12: 566 pg/mL (ref 180–914)

## 2022-06-24 LAB — PHOSPHORUS: Phosphorus: 3.3 mg/dL (ref 2.5–4.6)

## 2022-06-24 LAB — AMMONIA: Ammonia: 18 umol/L (ref 9–35)

## 2022-06-24 LAB — ETHANOL: Alcohol, Ethyl (B): <10 mg/dL (ref <10)

## 2022-06-24 MED ORDER — MAGNESIUM SULFATE 2 GM/50ML IV SOLN
2.0000 g | Freq: Once | INTRAVENOUS | Status: AC
  Administered 2022-06-24: 2 g via INTRAVENOUS
  Filled 2022-06-24: qty 50

## 2022-06-24 MED ORDER — ONDANSETRON HCL 4 MG PO TABS: 4.0000 mg | ORAL_TABLET | Freq: Four times a day (QID) | ORAL | Status: DC | PRN

## 2022-06-24 MED ORDER — ACETAMINOPHEN 325 MG PO TABS
650.0000 mg | ORAL_TABLET | Freq: Four times a day (QID) | ORAL | Status: DC | PRN
  Administered 2022-06-25 – 2022-06-29 (×3): 650 mg via ORAL
  Filled 2022-06-24 (×3): qty 2

## 2022-06-24 MED ORDER — LEVETIRACETAM IN NACL 1000 MG/100ML IV SOLN
1000.0000 mg | Freq: Two times a day (BID) | INTRAVENOUS | Status: DC
  Administered 2022-06-24 – 2022-06-25 (×3): 1000 mg via INTRAVENOUS
  Filled 2022-06-24 (×3): qty 100

## 2022-06-24 MED ORDER — ENOXAPARIN SODIUM 40 MG/0.4ML IJ SOSY
40.0000 mg | PREFILLED_SYRINGE | INTRAMUSCULAR | Status: DC
  Administered 2022-06-24 – 2022-06-28 (×5): 40 mg via SUBCUTANEOUS
  Filled 2022-06-24 (×5): qty 0.4

## 2022-06-24 MED ORDER — ONDANSETRON HCL 4 MG/2ML IJ SOLN: 4.0000 mg | Freq: Four times a day (QID) | INTRAMUSCULAR | Status: DC | PRN

## 2022-06-24 MED ORDER — LEVETIRACETAM IN NACL 1000 MG/100ML IV SOLN
1000.0000 mg | Freq: Once | INTRAVENOUS | Status: AC
  Administered 2022-06-24: 1000 mg via INTRAVENOUS
  Filled 2022-06-24: qty 100

## 2022-06-24 MED ORDER — LEVETIRACETAM IN NACL 1500 MG/100ML IV SOLN
1500.0000 mg | Freq: Once | INTRAVENOUS | Status: AC
  Administered 2022-06-24: 1500 mg via INTRAVENOUS
  Filled 2022-06-24: qty 100

## 2022-06-24 MED ORDER — THIAMINE HCL 100 MG/ML IJ SOLN: 100.0000 mg | Freq: Every day | INTRAMUSCULAR | Status: DC

## 2022-06-24 MED ORDER — THIAMINE HCL 100 MG/ML IJ SOLN
500.0000 mg | Freq: Three times a day (TID) | INTRAVENOUS | Status: AC
  Administered 2022-06-24 – 2022-06-26 (×4): 500 mg via INTRAVENOUS
  Filled 2022-06-24 (×6): qty 5

## 2022-06-24 MED ORDER — ACETAMINOPHEN 650 MG RE SUPP: 650.0000 mg | Freq: Four times a day (QID) | RECTAL | Status: DC | PRN

## 2022-06-24 MED ORDER — THIAMINE HCL 100 MG/ML IJ SOLN
250.0000 mg | Freq: Every day | INTRAVENOUS | Status: DC
  Administered 2022-06-27 – 2022-06-29 (×3): 250 mg via INTRAVENOUS
  Filled 2022-06-24 (×4): qty 2.5

## 2022-06-24 NOTE — Progress Notes (Addendum)
Neurology Progress Note   S:// Patient has been combative through the night requiring restraints She has received Ativan. She has had several admissions for seizure activity and her behavior is consistent with her past admissions No seizure activity reported overnight     O:// Current vital signs: BP (!) 134/95   Pulse 89   Temp 97.8 F (36.6 C) (Oral)   Resp 20   SpO2 100%  Vital signs in last 24 hours: Temp:  [97.5 F (36.4 C)-98.8 F (37.1 C)] 97.8 F (36.6 C) (10/26 0949) Pulse Rate:  [73-93] 89 (10/26 0949) Resp:  [15-23] 20 (10/26 0949) BP: (134-162)/(81-100) 134/95 (10/26 0949) SpO2:  [95 %-100 %] 100 % (10/26 0949) GENERAL Obese female , cooperative and NAD HEENT: - Normocephalic and atraumatic, dry mm, no LN++, no Thyromegally LUNGS - Clear to auscultation bilaterally with no wheezes CV - S1S2 RRR, no m/r/g, equal pulses bilaterally. ABDOMEN - Soft, nontender, nondistended with normoactive BS Ext: warm, well perfused, intact peripheral pulses, no edema NEURO:  Mental Status: AA&Ox1 (person) not to place and year. She has restraints and trying to pull them off.She tracks examiner in the room.  Speech is slow and dysarthric She keeps repeating the same sentence "gloves off". She confabulates during conversation. States she is still drinking daily and then when asked  the same question again she states she quit drinking. She is unable to follow complex examiner instructions   Cranial Nerves: PERRL 3 mm/brisk. EOMI, visual fields full, no facial asymmetry, facial sensation intact, hearing intact, tongue midline, normal sternocleidomastoid and trapezius muscle strength. No evidence of tongue atrophy or fibrillations Motor: Moves all extremities x 4 5/5 strength throughout  Tone: is normal and bulk is normal Sensation- Intact to light touch bilaterally Coordination: UTA Gait- deferred  Medications  Current Facility-Administered Medications:    acetaminophen (TYLENOL)  tablet 650 mg, 650 mg, Oral, Q6H PRN **OR** acetaminophen (TYLENOL) suppository 650 mg, 650 mg, Rectal, Q6H PRN, Alcario Drought, Jared M, DO   enoxaparin (LOVENOX) injection 40 mg, 40 mg, Subcutaneous, Q24H, Gardner, Jared M, DO   folic acid (FOLVITE) tablet 1 mg, 1 mg, Oral, Daily, Kingsley, Victoria K, DO   LORazepam (ATIVAN) tablet 1-4 mg, 1-4 mg, Oral, Q1H PRN **OR** LORazepam (ATIVAN) injection 1-4 mg, 1-4 mg, Intravenous, Q1H PRN, Maylon Peppers, Victoria K, DO, 2 mg at 06/24/22 0000   multivitamin with minerals tablet 1 tablet, 1 tablet, Oral, Daily, Kingsley, Victoria K, DO   ondansetron (ZOFRAN) tablet 4 mg, 4 mg, Oral, Q6H PRN **OR** ondansetron (ZOFRAN) injection 4 mg, 4 mg, Intravenous, Q6H PRN, Alcario Drought, Jared M, DO   thiamine (VITAMIN B1) 500 mg in normal saline (50 mL) IVPB, 500 mg, Intravenous, Q8H **FOLLOWED BY** [START ON 06/26/2022] thiamine (VITAMIN B1) 250 mg in sodium chloride 0.9 % 50 mL IVPB, 250 mg, Intravenous, Daily **FOLLOWED BY** [START ON 07/02/2022] thiamine (VITAMIN B1) injection 100 mg, 100 mg, Intravenous, Daily, Derek Jack, MD  Current Outpatient Medications:    albuterol (VENTOLIN HFA) 108 (90 Base) MCG/ACT inhaler, Inhale 2 puffs into the lungs every 6 (six) hours as needed for wheezing or shortness of breath. (Patient not taking: Reported on 02/05/2022), Disp: , Rfl: 2   amLODipine (NORVASC) 10 MG tablet, Take 1 tablet (10 mg total) by mouth daily. (Patient taking differently: Take 10 mg by mouth every morning.), Disp: , Rfl:    BIOTIN PO, Take 1 tablet by mouth daily., Disp: , Rfl:    carvedilol (COREG) 25 MG tablet, Take  1 tablet (25 mg total) by mouth 2 (two) times daily with a meal., Disp: 60 tablet, Rfl: 0   furosemide (LASIX) 20 MG tablet, Take 1 tablet (20 mg total) by mouth daily. Please make overdue appt with Dr. Gasper Sells before anymore refills. Thank you 1st attempt (Patient taking differently: Take 20 mg by mouth every morning.), Disp: 30 tablet, Rfl: 0    gabapentin (NEURONTIN) 300 MG capsule, Take 1 capsule (300 mg total) by mouth at bedtime. (Patient taking differently: Take 300 mg by mouth 2 (two) times daily.), Disp: 90 capsule, Rfl: 6   Multiple Vitamin (MULTIVITAMIN WITH MINERALS) TABS tablet, Take 1 tablet by mouth daily., Disp: , Rfl:    nystatin (MYCOSTATIN/NYSTOP) powder, Apply topically 3 (three) times daily. Apply to arms pits, fat folds, inter-trigone (Patient taking differently: Apply 1 application  topically 3 (three) times daily as needed (itching/rash). Apply to arms pits, fat folds, inter-trigone), Disp: 15 g, Rfl: 0   traZODone (DESYREL) 50 MG tablet, Take 50 mg by mouth at bedtime., Disp: , Rfl:    Vitamin D, Ergocalciferol, (DRISDOL) 1.25 MG (50000 UNIT) CAPS capsule, Take 50,000 Units by mouth every Sunday., Disp: , Rfl:  Labs CBC    Component Value Date/Time   WBC 6.9 06/24/2022 0350   RBC 3.65 (L) 06/24/2022 0350   HGB 10.8 (L) 06/24/2022 0350   HGB 9.8 (L) 10/16/2020 1547   HCT 32.4 (L) 06/24/2022 0350   HCT 29.8 (L) 10/16/2020 1547   PLT 206 06/24/2022 0350   PLT 236 10/16/2020 1547   MCV 88.8 06/24/2022 0350   MCV 94 10/16/2020 1547   MCH 29.6 06/24/2022 0350   MCHC 33.3 06/24/2022 0350   RDW 13.2 06/24/2022 0350   RDW 14.0 10/16/2020 1547   LYMPHSABS 0.9 06/23/2022 2225   LYMPHSABS 1.5 03/25/2020 1103   MONOABS 0.5 06/23/2022 2225   EOSABS 0.1 06/23/2022 2225   EOSABS 0.4 03/25/2020 1103   BASOSABS 0.0 06/23/2022 2225   BASOSABS 0.0 03/25/2020 1103    CMP     Component Value Date/Time   NA 136 06/24/2022 0350   NA 138 06/26/2020 0843   K 3.4 (L) 06/24/2022 0350   CL 97 (L) 06/24/2022 0350   CO2 24 06/24/2022 0350   GLUCOSE 105 (H) 06/24/2022 0350   BUN 14 06/24/2022 0350   BUN 23 06/26/2020 0843   CREATININE 1.72 (H) 06/24/2022 0350   CREATININE 1.59 (H) 11/21/2019 0908   CALCIUM 9.1 06/24/2022 0350   PROT 7.6 06/24/2022 0350   PROT 8.5 06/15/2019 1622   ALBUMIN 3.2 (L) 06/24/2022 0350    ALBUMIN 4.3 06/15/2019 1622   AST 18 06/24/2022 0350   AST 61 (H) 11/21/2019 0908   ALT 8 06/24/2022 0350   ALT 32 11/21/2019 0908   ALKPHOS 41 06/24/2022 0350   BILITOT 1.2 06/24/2022 0350   BILITOT 0.5 11/21/2019 0908   GFRNONAA 32 (L) 06/24/2022 0350   GFRNONAA 34 (L) 11/21/2019 0908   GFRAA 40 (L) 06/26/2020 0843   GFRAA 39 (L) 11/21/2019 0908    glycosylated hemoglobin  Lipid Panel     Component Value Date/Time   CHOL 216 (H) 02/10/2020 1536   TRIG 65 09/04/2021 0702   HDL 100 02/10/2020 1536   CHOLHDL 2.2 02/10/2020 1536   VLDL 20 02/10/2020 1536   LDLCALC 96 02/10/2020 1536     Imaging I have reviewed images in epic and the results pertinent to this consultation are:  CT-scan of the brain:06/24/22 COMPARISON:  04/04/2022 03/2022   FINDINGS: Brain: No evidence of acute infarction, hemorrhage, hydrocephalus, extra-axial collection or mass lesion/mass effect. Unchanged encephalomalacia of the bilateral occipital lobes (series 4, image 14).   Vascular: No hyperdense vessel or unexpected calcification.   Skull: Normal. Negative for fracture or focal lesion.   Sinuses/Orbits: No acute finding.   Other: None.   IMPRESSION: 1.  No acute intracranial pathology.   2.  Unchanged encephalomalacia of the bilateral occipital lobes.    Impression: [name] is a 66 y.o. female with PMH significant for breast cancer s/p mastectomy, hx of CKD 3, HTN, EtOh use with withdrawal seizures, morbid obesity, chronic diastolic CHF who has postictal confusion and agitation after she had 2 seizures and was given 5 mg of Versed by EMS yesterday .   Patient on  evaluation is unable to provide any meaningful history.  She has no focal deficit and confabulates.     It is not clear at this point if she was drinking alcohol and attempted to stop when she had a withdrawal seizure or she has developed a seizure disorder in the setting of prolonged alcohol use.   Still this AM  unable to obtain additional history from her in making decision regarding her drinking and seizure episodes  She was loaded with  Keppra 2500 mg and  this am her mental status has  not improved but it does fluctuate. On initial examination today she was able to ask [place] to take the mitts off. On repeat exam 2 hours later she did not say any sensical phrases. This raises concern for possible nonconvulsive seizures. She will need continuous EEG and  Keppra 1 G IV  q12  for maintenance   Recommendations: Neuro Check q 4 hours with vitals - Keppra 1 g IV q 12 hours, First dose now  - Continuous EEG due to her current mental status  - MRI brain wwo -CT Head non contrast this am reveals no acute pathology -Seizure Precautions and Pads -Fall Precautions - Will need to clarify Alcohol use when she is more awake and able to provide meaningful history. - Ativan 1-'2mg'$  for seizure lasting more than 3 mins - CIWA protocol - Thiamine level (add on) - Check TSH, B12, ammonia, RPR -High dose Thiamine: Thiamine 500 mg IV q 8 hours x 6 doses then 100 mg po qay. ______________________________________________________________________   Elenora Gamma , PA-C [facility]   Attending Neurohospitalist Addendum Patient seen and examined with APP/Resident. Agree with the history and physical as documented above. Agree with the plan as documented, which I helped formulate. I have edited the note above to reflect my full findings and recommendations. I have independently reviewed the chart, obtained history, review of systems and examined the patient.I have personally reviewed pertinent head/neck/spine imaging (CT/MRI). Please feel free to call with any questions.  -- Su Monks, MD [facility] [phone]  If 7pm- 7am, please page neurology on call as listed in [place].

## 2022-06-24 NOTE — ED Notes (Signed)
Report given to Emily, RN.

## 2022-06-24 NOTE — H&P (Addendum)
History and Physical    Patient: [name] LOV:[id number] DOB: April 28, 1956 DOA: 06/23/2022 DOS: the patient was seen and examined on 06/24/2022 PCP: [name], MD  Patient coming from: Home  Chief Complaint:  Chief Complaint  Patient presents with   Seizures    Alcohol withdrawal seizures   HPI: [name] is a 66 y.o. female with medical history significant of EtOH abuse, EtOH withdrawal seizures (3 admits thus far this year for same including 2 in the past 4 months), CKD 3b, compensated cirrhosis, breast CA.  Pt in to ED today after seizure at home.  EMS called, second seizure en route with EMS got '5mg'$  versed.  Per family: patient has been trying to quit alcohol but relapses and hides alcohol intake.  Here, she was combative in the ED, post ictal and had to be given ativan for agitation.  This also appears to be consistent with her typical post-ictal state according to documentation from last 2 admissions.  She is being admitted to the hospital for overnight monitoring and given Keppra load of '1500mg'$  IV once.    Review of Systems: As mentioned in the history of present illness. All other systems reviewed and are negative. Past Medical History:  Diagnosis Date   Alcohol dependence ([place])    Aortic atherosclerosis ([place])    Arthritis    Cancer ([place]) 11/2019   Left breast   Chronic heart failure (HCC)    Chronic kidney disease, stage 3 ([place])    COVID-19    Diverticulitis    Family history of ovarian cancer    Heart murmur    11/26/19 echo: Mild AS. AV mean gradient 12.0 mmHg; however, LVOT gradient 8 mmHg with intracvitary gradient-significant AS is not suspected   Hypertension    Right nephrolithiasis    Thoracic aortic aneurysm (HCC)    Tubular adenoma polyp of rectum    Past Surgical History:  Procedure Laterality Date   COLONOSCOPY WITH PROPOFOL N/A 02/24/2021   Procedure: COLONOSCOPY WITH PROPOFOL;  Surgeon: Yetta Flock, MD;  Location: [facility]  ENDOSCOPY;  Service: Gastroenterology;  Laterality: N/A;   MASTECTOMY WITH AXILLARY LYMPH NODE DISSECTION Left 12/19/2019   Procedure: LEFT MASTECTOMY WITH AXILLARY LYMPH NODE DISSECTION;  Surgeon: [name], MD;  Location: [facility];  Service: General;  Laterality: Left;   MULTIPLE EXTRACTIONS WITH ALVEOLOPLASTY Bilateral 12/14/2019   Procedure: MULTIPLE EXTRACTION WITH ALVEOLOPLASTY;  Surgeon: [name], DDS;  Location: [facility];  Service: Oral Surgery;  Laterality: Bilateral;   POLYPECTOMY  02/24/2021   Procedure: POLYPECTOMY;  Surgeon: Yetta Flock, MD;  Location: [facility];  Service: Gastroenterology;;   Sol Passer PLACEMENT N/A 12/19/2019   Procedure: INSERTION PORT-A-CATH WITH ULTRASOUND GUIDANCE;  Surgeon: [name], MD;  Location: [facility];  Service: General;  Laterality: N/A;   RADIAL HEAD ARTHROPLASTY Right 06/25/2019   Procedure: RIGHT RADIAL HEAD ARTHROPLASTY;  Surgeon: [name], MD;  Location: [facility];  Service: Orthopedics;  Laterality: Right;   TUBAL LIGATION     Social History:  reports that she has never smoked. She has never used smokeless tobacco. She reports current alcohol use of about 3.0 standard drinks of alcohol per week. She reports current drug use. Drug: Marijuana.  No Known Allergies  Family History  Problem Relation Age of Onset   Hypertension Mother    Dementia Mother    Ovarian cancer Half-Sister 19   Diabetes Half-Sister    Stroke Half-Sister     Prior to Admission medications  Medication Sig Start Date End Date Taking? Authorizing Provider  albuterol (VENTOLIN HFA) 108 (90 Base) MCG/ACT inhaler Inhale 2 puffs into the lungs every 6 (six) hours as needed for wheezing or shortness of breath. Patient not taking: Reported on 02/05/2022 09/12/21 09/12/22  [name], Corrie Mckusick, MD  amLODipine (NORVASC) 10 MG tablet Take 1 tablet (10 mg total) by mouth daily. Patient taking differently: Take 10 mg by mouth every morning. 09/12/21   [name], [name],  MD  BIOTIN PO Take 1 tablet by mouth daily.    [provider]  carvedilol (COREG) 25 MG tablet Take 1 tablet (25 mg total) by mouth 2 (two) times daily with a meal. 02/07/22 03/09/22  Pahwani, Michell Heinrich, MD  furosemide (LASIX) 20 MG tablet Take 1 tablet (20 mg total) by mouth daily. Please make overdue appt with Dr. Gasper Sells before anymore refills. Thank you 1st attempt Patient taking differently: Take 20 mg by mouth every morning. 06/03/21   Chandrasekhar, Lyda Kalata A, MD  gabapentin (NEURONTIN) 300 MG capsule Take 1 capsule (300 mg total) by mouth at bedtime. Patient taking differently: Take 300 mg by mouth 2 (two) times daily. 07/14/20   Magrinat, Virgie Dad, MD  Multiple Vitamin (MULTIVITAMIN WITH MINERALS) TABS tablet Take 1 tablet by mouth daily. 02/08/22   Pahwani, Michell Heinrich, MD  nystatin (MYCOSTATIN/NYSTOP) powder Apply topically 3 (three) times daily. Apply to arms pits, fat folds, inter-trigone Patient taking differently: Apply 1 application  topically 3 (three) times daily as needed (itching/rash). Apply to arms pits, fat folds, inter-trigone 09/12/21   [name], [name], MD  traZODone (DESYREL) 50 MG tablet Take 50 mg by mouth at bedtime. 01/18/22   [provider]  Vitamin D, Ergocalciferol, (DRISDOL) 1.25 MG (50000 UNIT) CAPS capsule Take 50,000 Units by mouth every Sunday. 10/30/21   [provider]    Physical Exam: Vitals:   06/23/22 2218 06/23/22 2228 06/24/22 0115  BP: (!) 157/81 (!) 157/81 (!) 154/95  Pulse: 93 88 89  Resp: '15 17 20  '$ Temp: 98.8 F (37.1 C) 98.8 F (37.1 C)   TempSrc: Oral    SpO2:  97% 95%   Constitutional: Somnolent Eyes: PERRL, lids and conjunctivae normal ENMT: Mucous membranes are moist. Posterior pharynx clear of any exudate or lesions.Normal dentition.  Neck: normal, supple, no masses, no thyromegaly Respiratory: clear to auscultation bilaterally, no wheezing, no crackles. Normal respiratory effort. No accessory muscle use.   Cardiovascular: Regular rate and rhythm, no murmurs / rubs / gallops. No extremity edema. 2+ pedal pulses. No carotid bruits.  Abdomen: no tenderness, no masses palpated. No hepatosplenomegaly. Bowel sounds positive.  Musculoskeletal: no clubbing / cyanosis. No joint deformity upper and lower extremities. Good ROM, no contractures. Normal muscle tone.  Skin: no rashes, lesions, ulcers. No induration Neurologic: MAE, localizes but not following commands Psychiatric: Somnolent, makes eye contact on both sides before dozing off.  Data Reviewed:    CT head w/o acute findings.  Mag 1.6     Latest Ref Rng & Units 06/23/2022   10:25 PM 04/06/2022    5:59 AM 04/05/2022    3:03 AM  CMP  Glucose 70 - 99 mg/dL 130  92    BUN 8 - 23 mg/dL 15  17    Creatinine 0.44 - 1.00 mg/dL 1.82  1.65    Sodium 135 - 145 mmol/L 137  138  140   Potassium 3.5 - 5.1 mmol/L 3.5  3.3  3.6   Chloride 98 - 111 mmol/L 98  104    CO2 22 - 32 mmol/L 23  25    Calcium 8.9 - 10.3 mg/dL 8.9  9.1    Total Protein 6.5 - 8.1 g/dL 7.5  7.1    Total Bilirubin 0.3 - 1.2 mg/dL 1.0  0.4    Alkaline Phos 38 - 126 U/L 43  44    AST 15 - 41 U/L 20  16    ALT 0 - 44 U/L 6  9        Latest Ref Rng & Units 06/23/2022   10:25 PM 04/06/2022    5:59 AM 04/05/2022    3:03 AM  CBC  WBC 4.0 - 10.5 K/uL 6.5  5.2    Hemoglobin 12.0 - 15.0 g/dL 10.7  9.7  10.9   Hematocrit 36.0 - 46.0 % 32.6  29.8  32.0   Platelets 150 - 400 K/uL 212  168       Assessment and Plan: * Alcohol withdrawal seizure ([place]) Pt with seizure and prolonged post-ictal state / delirium again.  Extensive history of same with EtOH withdrawal in past.  (3 prior admits this year alone for same including 2 within past 4 months). DDx includes development of seizure disorder due to prolonged EtOH use (not yet clear which at this point). See neuro consult note Tele monitor Seizure precautions CIWA for EtOH withdrawal Keppra load but no maint at this time. Replace  Mg  HBV (hepatitis B virus) infection Looks like pt has history of HBV with positive core AB and antigen, but neg for surface antibody. Not clear if she was ever treated for this. Will check HBV viral load.  Stage 3b chronic kidney disease (CKD) (HCC) Creat 1.8 today appears baseline.  Cirrhosis of liver without ascites (HCC) Will check ammonia, but hasnt been elevated during prior admits. Likely due to extensive EtOH use history, and looks like shes had / has HBV as well.  Anemia of chronic disease HGB 10.7 today appears baseline.  Malignant neoplasm of upper-outer quadrant of left breast in female, estrogen receptor positive ([place]) Needs to follow up with Dr. Chryl Heck, looks like Dr. Chryl Heck recently tried to call patient and schedule appointment. Port is chronically thrombosed.      Advance Care Planning:   Code Status: Full Code  Consults: Neurology  Family Communication: No family in room  Severity of Illness: The appropriate patient status for this patient is OBSERVATION. Observation status is judged to be reasonable and necessary in order to provide the required intensity of service to ensure the patient's safety. The patient's presenting symptoms, physical exam findings, and initial radiographic and laboratory data in the context of their medical condition is felt to place them at decreased risk for further clinical deterioration. Furthermore, it is anticipated that the patient will be medically stable for discharge from the hospital within 2 midnights of admission.   Author: Etta Quill., DO 06/24/2022 2:46 AM  For on call review [email or web address].

## 2022-06-24 NOTE — Assessment & Plan Note (Deleted)
Pt with seizure and prolonged post-ictal state / delirium again.  Extensive history of same with EtOH withdrawal in past.  (3 prior admits this year alone for same including 2 within past 4 months). DDx includes development of seizure disorder due to prolonged EtOH use (not yet clear which at this point). 1. See neuro consult note 2. Tele monitor 3. Seizure precautions 4. CIWA for EtOH withdrawal 5. Keppra load but no maint at this time. 6. Replace Mg

## 2022-06-24 NOTE — Assessment & Plan Note (Addendum)
Needs to follow up with Dr. Chryl Heck, looks like Dr. Chryl Heck recently tried to call patient and schedule appointment. Port is chronically thrombosed.

## 2022-06-24 NOTE — ED Notes (Signed)
Physician at bedside.

## 2022-06-24 NOTE — Consult Note (Addendum)
NEUROLOGY CONSULTATION NOTE   Date of service: June 24, 2022 Patient Name: [name] MRN:  [id number] DOB:  [birth year] Reason for consult: "Seizures x 2, post ictal" Requesting Provider: Ripley Fraise, MD _ _ _   _ __   _ __ _ _  __ __   _ __   __ _  History of Present Illness  [name] is a 66 y.o. female with PMH significant for breast cancer s/p mastectomy, hx of CKD 3, HTN, EtOh use with withdrawal seizures, morbid obesity, chronic diastolic CHF who was brought into the ED after she had a seizure at home and another seizure in route witnessed by EMS requiring 5 mg of Versed.  Per family, patient has been trying to quit alcohol but relapses and hide alcohol intake.  She does have a history of prior alcohol withdrawal seizure and is known to sneak in some alcohol. In fact, I saw her back in august as a code stroke after she was brought in by EMS and she was noted to be in seizure on arrival. This occurred in the setting of alcohol use the day prior and noted hungover by family in AM.  Here, she was combative in the ED, post ictal and had to be given ativan for agitation. She is being admitted to the hospital for overnight monitoring and given Keppra load of '1500mg'$  IV once.  Neurology consulted for further assistance with seizure management and to see if she would benefit from longterm AEDs.  On my evaluation, patient is somnolent, she makes eye contact on both sides before dozing off again. She moves all extremities spontaneously and localizes but does not follow command. No residual jerking concerning for seizure at this time.  ROS  Unable to obtain detailed review of system due to somnolence and postictal confusion.  Past History   Past Medical History:  Diagnosis Date   Alcohol dependence ([place])    Aortic atherosclerosis ([place])    Arthritis    Cancer ([place]) 11/2019   Left breast   Chronic heart failure (HCC)    Chronic kidney disease, stage 3 ([place])    COVID-19     Diverticulitis    Family history of ovarian cancer    Heart murmur    11/26/19 echo: Mild AS. AV mean gradient 12.0 mmHg; however, LVOT gradient 8 mmHg with intracvitary gradient-significant AS is not suspected   Hypertension    Right nephrolithiasis    Thoracic aortic aneurysm (HCC)    Tubular adenoma polyp of rectum    Past Surgical History:  Procedure Laterality Date   COLONOSCOPY WITH PROPOFOL N/A 02/24/2021   Procedure: COLONOSCOPY WITH PROPOFOL;  Surgeon: Yetta Flock, MD;  Location: [facility];  Service: Gastroenterology;  Laterality: N/A;   MASTECTOMY WITH AXILLARY LYMPH NODE DISSECTION Left 12/19/2019   Procedure: LEFT MASTECTOMY WITH AXILLARY LYMPH NODE DISSECTION;  Surgeon: [name], MD;  Location: [facility];  Service: General;  Laterality: Left;   MULTIPLE EXTRACTIONS WITH ALVEOLOPLASTY Bilateral 12/14/2019   Procedure: MULTIPLE EXTRACTION WITH ALVEOLOPLASTY;  Surgeon: [name], DDS;  Location: [facility];  Service: Oral Surgery;  Laterality: Bilateral;   POLYPECTOMY  02/24/2021   Procedure: POLYPECTOMY;  Surgeon: Yetta Flock, MD;  Location: [facility];  Service: Gastroenterology;;   Sol Passer PLACEMENT N/A 12/19/2019   Procedure: INSERTION PORT-A-CATH WITH ULTRASOUND GUIDANCE;  Surgeon: [name], MD;  Location: [facility];  Service: General;  Laterality: N/A;   RADIAL HEAD ARTHROPLASTY Right 06/25/2019   Procedure: RIGHT RADIAL  HEAD ARTHROPLASTY;  Surgeon: [name], MD;  Location: [employer] [facility] [facility];  Service: Orthopedics;  Laterality: Right;   TUBAL LIGATION     Family History  Problem Relation Age of Onset   Hypertension Mother    Dementia Mother    Ovarian cancer Half-Sister 58   Diabetes Half-Sister    Stroke Half-Sister    Social History   Socioeconomic History   Marital status: Legally Separated    Spouse name: Not on file   Number of children: Not on file   Years of education: Not on file   Highest education level: Not on file  Occupational  History   Not on file  Tobacco Use   Smoking status: Never   Smokeless tobacco: Never  Vaping Use   Vaping Use: Never used  Substance and Sexual Activity   Alcohol use: Yes    Alcohol/week: 3.0 standard drinks of alcohol    Types: 1 Cans of beer, 2 Shots of liquor per week    Comment: daily   Drug use: Yes    Types: Marijuana   Sexual activity: Not Currently  Other Topics Concern   Not on file  Social History Narrative   Grand daughter stays with her   Mrs Newstrom cares for her 17 developmental delayed daughter [name] with mental health issues at home - Previous was at a group home   Social Determinants of Health   Financial Resource Strain: Low Risk  (07/04/2020)   Overall Financial Resource Strain (CARDIA)    Difficulty of Paying Living Expenses: Not hard at all  Food Insecurity: No Food Insecurity (12/10/2021)   Hunger Vital Sign    Worried About Running Out of Food in the Last Year: Never true    [place] in the Last Year: Never true  Transportation Needs: No Transportation Needs (12/10/2021)   PRAPARE - Hydrologist (Medical): No    Lack of Transportation (Non-Medical): No  Physical Activity: Not on file  Stress: Stress Concern Present (12/10/2021)   [place]    Feeling of Stress : Rather much  Social Connections: Moderately Integrated (12/10/2021)   Social Connection and Isolation Panel [NHANES]    Frequency of Communication with Friends and Family: More than three times a week    Frequency of Social Gatherings with Friends and Family: More than three times a week    Attends Religious Services: 1 to 4 times per year    Active Member of Clubs or Organizations: Yes    Attends Archivist Meetings: 1 to 4 times per year    Marital Status: Separated   No Known Allergies  Medications  (Not in a hospital admission)    Vitals   Vitals:   06/23/22 2218  06/23/22 2228 06/24/22 0115  BP: (!) 157/81 (!) 157/81 (!) 154/95  Pulse: 93 88 89  Resp: '15 17 20  '$ Temp: 98.8 F (37.1 C) 98.8 F (37.1 C)   TempSrc: Oral    SpO2:  97% 95%     There is no height or weight on file to calculate BMI.  Physical Exam   General: Laying comfortably in bed; in no acute distress.  HENT: Normal oropharynx and mucosa. Normal external appearance of ears and nose.  Neck: Supple, no pain or tenderness  CV: No JVD. No peripheral edema.  Pulmonary: Symmetric Chest rise. Normal respiratory effort.  Abdomen: Soft to touch, non-tender.  Ext: No  cyanosis, edema, or deformity  Skin: No rash. Normal palpation of skin.   Musculoskeletal: Normal digits and nails by inspection. No clubbing.   Neurologic Examination  Mental status/Cognition: Somnolent, opens eyes briefly to loud voice or to tactile stimulation.  She will make eye contact but will not answer any questions.  Very poor attention.   Speech/language: Moans but no legible speech.   Cranial nerves:   CN II Pupils equal and reactive to light, unable to assess for visual field deficit.   CN III,IV,VI EOM intact to doll's eye.  She will make brief eye contact on both left and right.   CN V normal sensation in V1, V2, and V3 segments bilaterally   CN VII Symmetric facial grimace.   CN VIII Turns head towards speech.   CN IX & X Difficult to assess but she seems to protecting her airway fine.   CN XI Head midline   CN XII midline tongue but does not protrude on command.   Motor/sensory:  Muscle bulk: Normal, tone normal  She has spontaneous and antigravity movement in all extremities.  She will not follow commands.  She will localize to mild pinch in all extremities.  Coordination/Complex Motor:  Unable to assess. - Gait: Deferred for patient safety. Labs   CBC:  Recent Labs  Lab 06/23/22 2225  WBC 6.5  NEUTROABS 4.9  HGB 10.7*  HCT 32.6*  MCV 90.3  PLT 578    Basic Metabolic Panel:  Lab  Results  Component Value Date   NA 137 06/23/2022   K 3.5 06/23/2022   CO2 23 06/23/2022   GLUCOSE 130 (H) 06/23/2022   BUN 15 06/23/2022   CREATININE 1.82 (H) 06/23/2022   CALCIUM 8.9 06/23/2022   GFRNONAA 30 (L) 06/23/2022   GFRAA 40 (L) 06/26/2020   Lipid Panel:  Lab Results  Component Value Date   LDLCALC 96 02/10/2020   HgbA1c:  Lab Results  Component Value Date   HGBA1C 5.0 09/01/2021   Urine Drug Screen from august 2023    Component Value Date/Time   LABOPIA NONE DETECTED 04/05/2022 0109   COCAINSCRNUR NONE DETECTED 04/05/2022 0109   COCAINSCRNUR NEG 05/17/2008 2055   LABBENZ NONE DETECTED 04/05/2022 0109   LABBENZ NEG 05/17/2008 2055   AMPHETMU NONE DETECTED 04/05/2022 0109   THCU POSITIVE (A) 04/05/2022 0109   LABBARB NONE DETECTED 04/05/2022 0109    Alcohol Level     Component Value Date/Time   ETH <10 06/23/2022 2225    Impression   Nargis Abrams is a 66 y.o. female with PMH significant for breast cancer s/p mastectomy, hx of CKD 3, HTN, EtOh use with withdrawal seizures, morbid obesity, chronic diastolic CHF who has postictal confusion and agitation after she had 2 seizures and was given 5 mg of Versed by EMS.  Patient's somnolent on my evaluation and unable to provide any meaningful history.  She has no focal deficit.  I was unable to get in touch with family to get additional history regarding her continued alcohol use.  Her alcohol level here is undetectable.  It is not clear at this point if she was drinking alcohol and attempted to stop when she had a withdrawal seizure or she has developed a seizure disorder in the setting of prolonged alcohol use.  Obtaining additional history would be pertinent in making decision regarding maintenance AEDs.  At this time, I agree with loading her with Keppra 2500 mg and talking to the patient the morning in private  to see if the seizures occurred in the setting of abruptly cutting down/withdrawal of alcohol versus if  she has developed seizure disorder in setting of long-term alcohol use.  Recommendations  - Agree with 1 time dose of Keppra '1500mg'$  IV once, will give her an additional '1000mg'$  to bring her closer to '20mg'$ /Kg. - Hold off on maintenance Keppra at this time. - will get CT Head without contrast and a routine EEG. No clinical suspicion for ongoing seizures. - Will need to clarify Alcohol use with patient in the morning when she is more awake and able to provide meaningful history. - seizure precautions with seizure pads - Ativan 1-'2mg'$  for seizure lasting more than 3 mins - CIWA protocol ______________________________________________________________________   Thank you for the opportunity to take part in the care of this patient. If you have any further questions, please contact the neurology consultation attending.  Signed,  [place] Pager Number [phone] _ _ _   _ __   _ __ _ _  __ __   _ __   __ _

## 2022-06-24 NOTE — Progress Notes (Signed)
TOC CSW attempted to consult with pt.  Pt was unarousable.  CSW will continue to follow.  Analy Bassford Tarpley-Carter, MSW, LCSW-A Pronouns:  She/Her/Hers [facility] HealthTransitions of Care Clinical Social Worker Direct Number:  [phone] [email or web address]

## 2022-06-24 NOTE — ED Notes (Signed)
ED TO INPATIENT HANDOFF REPORT  ED Nurse Name and Phone #: Camara Rosander RN [phone]  S Name/Age/Gender [name] 66 y.o. female Room/Bed: 87C/042C  Code Status   Code Status: Full Code  Home/SNF/Other Skilled nursing facility Patient oriented to: self Is this baseline? No   Triage Complete: Triage complete  Chief Complaint Alcohol withdrawal seizure ([place]) [F10.939, O13.0] Acute metabolic encephalopathy [Q65.78]  Triage Note Patient arrives via EMS after two seizures tonight, one grand mal type witnessed by EMS. CBG 111 with EMS. Was found on a couch so no evidence of fall. Hx of seizures d/t alcohol withdrawal. Unknown last drink. EMS gave '5MG'$  midazolam, initally placed and NPA and was bagging via BVM. Arrives on nonrebeather, transitioned to room air. Awake and attempting to assume position of comfort, not following commands, vocalizes/withdrawals from pain. EDP at bedside during EMS report.   Allergies No Known Allergies  Level of Care/Admitting Diagnosis ED Disposition     ED Disposition  Admit   Condition  --   Comment  Hospital Area: [facility] [[id number]]  Level of Care: Telemetry Medical [104]  May [facility] patient to [facility] or [facility] if equivalent level of care is available:: Yes  Covid Evaluation: Confirmed COVID Negative  Diagnosis: Acute metabolic encephalopathy [4696295]  Admitting Physician: Vashti Hey [[id number]]  Attending Physician: Vashti Hey [[id number]]  Certification:: I certify this patient will need inpatient services for at least 2 midnights  Estimated Length of Stay: 5          B Medical/Surgery History Past Medical History:  Diagnosis Date   Alcohol dependence ([place])    Aortic atherosclerosis ([place])    Arthritis    Cancer ([place]) 11/2019   Left breast   Chronic heart failure ([place])    Chronic kidney disease, stage 3 ([place])    COVID-19    Diverticulitis    Family history of ovarian cancer     Heart murmur    11/26/19 echo: Mild AS. AV mean gradient 12.0 mmHg; however, LVOT gradient 8 mmHg with intracvitary gradient-significant AS is not suspected   Hypertension    Right nephrolithiasis    Thoracic aortic aneurysm ([facility])    Tubular adenoma polyp of rectum    Past Surgical History:  Procedure Laterality Date   COLONOSCOPY WITH PROPOFOL N/A 02/24/2021   Procedure: COLONOSCOPY WITH PROPOFOL;  Surgeon: Yetta Flock, MD;  Location: [facility];  Service: Gastroenterology;  Laterality: N/A;   MASTECTOMY WITH AXILLARY LYMPH NODE DISSECTION Left 12/19/2019   Procedure: LEFT MASTECTOMY WITH AXILLARY LYMPH NODE DISSECTION;  Surgeon: [name], MD;  Location: [facility];  Service: General;  Laterality: Left;   MULTIPLE EXTRACTIONS WITH ALVEOLOPLASTY Bilateral 12/14/2019   Procedure: MULTIPLE EXTRACTION WITH ALVEOLOPLASTY;  Surgeon: [name], DDS;  Location: [facility];  Service: Oral Surgery;  Laterality: Bilateral;   POLYPECTOMY  02/24/2021   Procedure: POLYPECTOMY;  Surgeon: Yetta Flock, MD;  Location: [facility];  Service: Gastroenterology;;   Sol Passer PLACEMENT N/A 12/19/2019   Procedure: INSERTION PORT-A-CATH WITH ULTRASOUND GUIDANCE;  Surgeon: [name], MD;  Location: [facility];  Service: General;  Laterality: N/A;   RADIAL HEAD ARTHROPLASTY Right 06/25/2019   Procedure: RIGHT RADIAL HEAD ARTHROPLASTY;  Surgeon: [name], MD;  Location: [facility];  Service: Orthopedics;  Laterality: Right;   TUBAL LIGATION       A IV Location/Drains/Wounds Patient Lines/Drains/Airways Status     Active Line/Drains/Airways     Name Placement date Placement  time Site Days   Implanted Port 12/19/19 Right Chest 12/19/19  --  Chest  918   Peripheral IV 06/23/22 18 G Left External jugular 06/23/22  --  External jugular  1   External Urinary Catheter 02/04/22  1804  --  140   Wound / Incision (Open or Dehisced) 02/04/22 Irritant Dermatitis (Moisture Associated Skin Damage)  Vagina MASD 02/04/22  1800  Vagina  140   Wound / Incision (Open or Dehisced) 02/04/22 Irritant Dermatitis (Moisture Associated Skin Damage) Pelvis Anterior;Lower;Mid MASD 02/04/22  1800  Pelvis  140            Intake/Output Last 24 hours  Intake/Output Summary (Last 24 hours) at 06/24/2022 1823 Last data filed at 06/24/2022 0510 Gross per 24 hour  Intake 132.21 ml  Output --  Net 132.21 ml    Labs/Imaging Results for orders placed or performed during the hospital encounter of 06/23/22 (from the past 48 hour(s))  Comprehensive metabolic panel     Status: Abnormal   Collection Time: 06/23/22 10:25 PM  Result Value Ref Range   Sodium 137 135 - 145 mmol/L   Potassium 3.5 3.5 - 5.1 mmol/L   Chloride 98 98 - 111 mmol/L   CO2 23 22 - 32 mmol/L   Glucose, Bld 130 (H) 70 - 99 mg/dL    Comment: Glucose reference range applies only to samples taken after fasting for at least 8 hours.   BUN 15 8 - 23 mg/dL   Creatinine, Ser 1.82 (H) 0.44 - 1.00 mg/dL   Calcium 8.9 8.9 - 10.3 mg/dL   Total Protein 7.5 6.5 - 8.1 g/dL   Albumin 3.2 (L) 3.5 - 5.0 g/dL   AST 20 15 - 41 U/L   ALT 6 0 - 44 U/L   Alkaline Phosphatase 43 38 - 126 U/L   Total Bilirubin 1.0 0.3 - 1.2 mg/dL   GFR, Estimated 30 (L) >60 mL/min    Comment: (NOTE) Calculated using the CKD-EPI Creatinine Equation (2021)    Anion gap 16 (H) 5 - 15    Comment: Performed at [facility], [place] [address] [place], [place] [address]  Ethanol     Status: None   Collection Time: 06/23/22 10:25 PM  Result Value Ref Range   Alcohol, Ethyl (B) <10 <10 mg/dL    Comment: (NOTE) Lowest detectable limit for serum alcohol is 10 mg/dL.  For medical purposes only. Performed at [facility], [place] [address], [place], [place] [address]   CBC with Differential     Status: Abnormal   Collection Time: 06/23/22 10:25 PM  Result Value Ref Range   WBC 6.5 4.0 - 10.5 K/uL   RBC 3.61 (L) 3.87 - 5.11 MIL/uL   Hemoglobin 10.7 (L) 12.0  - 15.0 g/dL   HCT 32.6 (L) 36.0 - 46.0 %   MCV 90.3 80.0 - 100.0 fL   MCH 29.6 26.0 - 34.0 pg   MCHC 32.8 30.0 - 36.0 g/dL   RDW 13.2 11.5 - 15.5 %   Platelets 212 150 - 400 K/uL   nRBC 0.0 0.0 - 0.2 %   Neutrophils Relative % 76 %   Neutro Abs 4.9 1.7 - 7.7 K/uL   Lymphocytes Relative 15 %   Lymphs Abs 0.9 0.7 - 4.0 K/uL   Monocytes Relative 7 %   Monocytes Absolute 0.5 0.1 - 1.0 K/uL   Eosinophils Relative 2 %   Eosinophils Absolute 0.1 0.0 - 0.5 K/uL  Basophils Relative 0 %   Basophils Absolute 0.0 0.0 - 0.1 K/uL   Immature Granulocytes 0 %   Abs Immature Granulocytes 0.02 0.00 - 0.07 K/uL    Comment: Performed at [facility], [place] [address], [place], [place] [address]  Magnesium     Status: Abnormal   Collection Time: 06/23/22 10:25 PM  Result Value Ref Range   Magnesium 1.6 (L) 1.7 - 2.4 mg/dL    Comment: Performed at [facility] [address], [place], [place] [address]  Phosphorus     Status: None   Collection Time: 06/23/22 10:25 PM  Result Value Ref Range   Phosphorus 3.3 2.5 - 4.6 mg/dL    Comment: Performed at [facility], [place] [address] [place], [place] [address]  POC CBG, ED     Status: Abnormal   Collection Time: 06/23/22 10:32 PM  Result Value Ref Range   Glucose-Capillary 122 (H) 70 - 99 mg/dL    Comment: Glucose reference range applies only to samples taken after fasting for at least 8 hours.  Ammonia     Status: None   Collection Time: 06/24/22  3:47 AM  Result Value Ref Range   Ammonia 18 9 - 35 umol/L    Comment: Performed at [facility], [place] [address], [place], [place] [address]  CBC     Status: Abnormal   Collection Time: 06/24/22  3:50 AM  Result Value Ref Range   WBC 6.9 4.0 - 10.5 K/uL   RBC 3.65 (L) 3.87 - 5.11 MIL/uL   Hemoglobin 10.8 (L) 12.0 - 15.0 g/dL   HCT 32.4 (L) 36.0 - 46.0 %   MCV 88.8 80.0 - 100.0 fL   MCH 29.6 26.0 - 34.0 pg   MCHC 33.3 30.0 - 36.0 g/dL   RDW 13.2 11.5 - 15.5 %   Platelets 206  150 - 400 K/uL   nRBC 0.0 0.0 - 0.2 %    Comment: Performed at [facility], [place] [address] [place], [place] [address]  Comprehensive metabolic panel     Status: Abnormal   Collection Time: 06/24/22  3:50 AM  Result Value Ref Range   Sodium 136 135 - 145 mmol/L   Potassium 3.4 (L) 3.5 - 5.1 mmol/L   Chloride 97 (L) 98 - 111 mmol/L   CO2 24 22 - 32 mmol/L   Glucose, Bld 105 (H) 70 - 99 mg/dL    Comment: Glucose reference range applies only to samples taken after fasting for at least 8 hours.   BUN 14 8 - 23 mg/dL   Creatinine, Ser 1.72 (H) 0.44 - 1.00 mg/dL   Calcium 9.1 8.9 - 10.3 mg/dL   Total Protein 7.6 6.5 - 8.1 g/dL   Albumin 3.2 (L) 3.5 - 5.0 g/dL   AST 18 15 - 41 U/L   ALT 8 0 - 44 U/L   Alkaline Phosphatase 41 38 - 126 U/L   Total Bilirubin 1.2 0.3 - 1.2 mg/dL   GFR, Estimated 32 (L) >60 mL/min    Comment: (NOTE) Calculated using the CKD-EPI Creatinine Equation (2021)    Anion gap 15 5 - 15    Comment: Performed at [facility] [address], [place], [place] [address]  Vitamin B12     Status: None   Collection Time: 06/24/22 12:18 PM  Result Value Ref Range   Vitamin B-12 566 180 - 914 pg/mL    Comment: (NOTE)  This assay is not validated for testing neonatal or myeloproliferative syndrome specimens for Vitamin B12 levels. Performed at [facility], [place] [address] [place], [place] [address]   TSH     Status: None   Collection Time: 06/24/22 12:18 PM  Result Value Ref Range   TSH 1.061 0.350 - 4.500 uIU/mL    Comment: Performed by a 3rd Generation assay with a functional sensitivity of <=0.01 uIU/mL. Performed at [facility], [place] [address], [place], [place] [address]    CT HEAD WO CONTRAST (5MM)  Result Date: 06/24/2022 CLINICAL DATA:  Seizure, altered mental status EXAM: CT HEAD WITHOUT CONTRAST TECHNIQUE: Contiguous axial images were obtained from the base of the skull through the vertex without intravenous contrast. RADIATION  DOSE REDUCTION: This exam was performed according to the departmental dose-optimization program which includes automated exposure control, adjustment of the mA and/or kV according to patient size and/or use of iterative reconstruction technique. COMPARISON:  04/04/2022 03/2022 FINDINGS: Brain: No evidence of acute infarction, hemorrhage, hydrocephalus, extra-axial collection or mass lesion/mass effect. Unchanged encephalomalacia of the bilateral occipital lobes (series 4, image 14). Vascular: No hyperdense vessel or unexpected calcification. Skull: Normal. Negative for fracture or focal lesion. Sinuses/Orbits: No acute finding. Other: None. IMPRESSION: 1.  No acute intracranial pathology. 2.  Unchanged encephalomalacia of the bilateral occipital lobes. Electronically Signed   By: Delanna Ahmadi M.D.   On: 06/24/2022 08:23   DG Chest 1 View  Result Date: 06/23/2022 CLINICAL DATA:  Seizures, hypoxia. EXAM: CHEST  1 VIEW COMPARISON:  Chest radiograph dated April 04, 2022. FINDINGS: The heart is enlarged. Right IJ access MediPort with distal tip in the SVC. Low lung volumes with bibasilar atelectasis. No large pleural effusion or pneumothorax. Multiple surgical clips overlying the left axilla. No acute osseous abnormality. IMPRESSION: 1. Low lung volumes with bibasilar atelectasis. 2. Cardiomegaly. 3. Right IJ access MediPort with distal tip in the SVC. Electronically Signed   By: Keane Police D.O.   On: 06/23/2022 22:41    Pending Labs Unresulted Labs (From admission, onward)     Start     Ordered   06/24/22 1500  Vitamin B1  Once,   AD        06/24/22 1500   06/24/22 1218  RPR  Once,   R        06/24/22 1217   06/24/22 0258  Hepatitis B DNA, ultraquantitative, PCR  Once,   R        06/24/22 0257            Vitals/Pain Today's Vitals   06/24/22 1411 06/24/22 1700 06/24/22 1745 06/24/22 1807  BP:  125/72 129/74   Pulse:  73 89   Resp:  12    Temp: 98 F (36.7 C)   98.1 F (36.7 C)   TempSrc: Oral   Oral  SpO2:  100%      Isolation Precautions No active isolations  Medications Medications  LORazepam (ATIVAN) tablet 1-4 mg ( Oral See Alternative 06/24/22 1807)    Or  LORazepam (ATIVAN) injection 1-4 mg (2 mg Intravenous Given 50/09/38 1829)  folic acid (FOLVITE) tablet 1 mg (1 mg Oral Not Given 06/24/22 1307)  multivitamin with minerals tablet 1 tablet (1 tablet Oral Not Given 06/24/22 1307)  enoxaparin (LOVENOX) injection 40 mg (has no administration in time range)  acetaminophen (TYLENOL) tablet 650 mg (has no administration in time range)    Or  acetaminophen (TYLENOL) suppository 650 mg (  has no administration in time range)  ondansetron (ZOFRAN) tablet 4 mg (has no administration in time range)    Or  ondansetron (ZOFRAN) injection 4 mg (has no administration in time range)  thiamine (VITAMIN B1) 500 mg in normal saline (50 mL) IVPB (0 mg Intravenous Stopped 06/24/22 1552)    Followed by  thiamine (VITAMIN B1) 250 mg in sodium chloride 0.9 % 50 mL IVPB (has no administration in time range)    Followed by  thiamine (VITAMIN B1) injection 100 mg (has no administration in time range)  levETIRAcetam (KEPPRA) IVPB 1000 mg/100 mL premix (0 mg Intravenous Stopped 06/24/22 1345)  levETIRAcetam (KEPPRA) IVPB 1500 mg/ 100 mL premix (0 mg Intravenous Stopped 06/24/22 0252)  levETIRAcetam (KEPPRA) IVPB 1000 mg/100 mL premix (0 mg Intravenous Stopped 06/24/22 0410)  magnesium sulfate IVPB 2 g 50 mL (0 g Intravenous Stopped 06/24/22 0510)    Mobility walks with device High fall risk   Focused Assessments Cardiac Assessment Handoff:    Lab Results  Component Value Date   CKTOTAL 115 04/05/2022   Lab Results  Component Value Date   DDIMER 1.84 (H) 08/03/2020   Does the Patient currently have chest pain? No   , Neuro Assessment Handoff:  Swallow screen pass? No          Neuro Assessment:   Neuro Checks:      Last Documented NIHSS Modified Score:    Has TPA been given? No If patient is a Neuro Trauma and patient is going to OR before floor call report to [facility] nurse: [phone] or [phone]  , Pulmonary Assessment Handoff:  Lung sounds:   O2 Device: Room Air      R Recommendations: See Admitting Provider Note  Report given to:   Additional Notes:

## 2022-06-24 NOTE — Assessment & Plan Note (Addendum)
Hypokalemia, hypomagnesemia.   No signs of volume overload.  Renal function stable with serum cr at 1,64, K is 3,7 and serum bicarbonate at 25. Na 139 and Mg 1,5   Add 4 g mag sulfate and follow up electrolytes in am. Patient is tolerating po well.  Continue to hold on loop diuretic therapy for now.

## 2022-06-24 NOTE — Progress Notes (Signed)
Patient tore off head wrap and wires while down in the ED; patient was in mittens. When patient moved to the floor, tech performed maintenance. Made nursing staff aware that patient has gotten out of mittens and ripped off wires and to keep an eye on patient.

## 2022-06-24 NOTE — ED Provider Notes (Signed)
I assumed care of patient at signout.  Patient began having postictal confusion, did receive Ativan.  She is currently in mittens so she does not pull off  medical equipment.  I discussed with neurology who will see the patient.  Patient given Keppra.  Admitted to the hospitalist service under Dr. Alcario Drought   EKG Interpretation  Date/Time:  Thursday June 24 2022 00:58:29 EDT Ventricular Rate:  90 PR Interval:  195 QRS Duration: 77 QT Interval:  348 QTC Calculation: 426 R Axis:   37 Text Interpretation: Sinus rhythm Anteroseptal infarct, age indeterminate Nonspecific T abnormalities, lateral leads Confirmed by Ripley Fraise [id number]) on 06/24/2022 2:13:47 AM          Ripley Fraise, MD 06/24/22 [phone]

## 2022-06-24 NOTE — Assessment & Plan Note (Addendum)
No clinical signs of decompensated liver failure Continue close monitoring of neuro checks.  Will need outpatient follow up.

## 2022-06-24 NOTE — ED Notes (Signed)
This RN went into room to find that patient had ripped off mitts and ripped off her EEG monitor. EEG tech notified of same and came to bedside. This RN also let nurse upstairs aware of same.

## 2022-06-24 NOTE — Assessment & Plan Note (Deleted)
Creat 1.8 today seems to be his baseline.

## 2022-06-24 NOTE — ED Notes (Signed)
Patient cleaned, turned, and gown changed.

## 2022-06-24 NOTE — ED Notes (Signed)
Patient unable to give pain location, time, place, situation.

## 2022-06-24 NOTE — Progress Notes (Signed)
LTM EEG hooked up and running. MRI leads used- no initial skin breakdown. Pt still waiting for room on the floor.

## 2022-06-24 NOTE — Assessment & Plan Note (Addendum)
HGB 10.7 today appears baseline. Close follow up on Hgb

## 2022-06-24 NOTE — Assessment & Plan Note (Addendum)
1. Follow up on Hep B viral load.

## 2022-06-24 NOTE — Progress Notes (Signed)
Briefly, patient is a 66 year old female with cirrhosis, breast CA and ongoing alcohol use who was admitted earlier this morning for alcohol withdrawal seizures.    This morning patient was overtly confused, was unable to be directed, had mitts in place and was seen by neurology.  CT was negative for any acute pathology.    Patient was seen by neurology who notes that this behavior is apparently consistent with previous admissions.  They do not believe that this is hepatic encephalopathy but more likely delirium tremens.  Ammonia level today is within normal limits at 18.  She was started on Keppra and continued on CIWA protocol.  Cirrhosis is compensated.

## 2022-06-25 ENCOUNTER — Inpatient Hospital Stay (HOSPITAL_COMMUNITY): Payer: Medicare Other

## 2022-06-25 DIAGNOSIS — R569 Unspecified convulsions: Secondary | ICD-10-CM | POA: Diagnosis not present

## 2022-06-25 LAB — COMPREHENSIVE METABOLIC PANEL
ALT: 7 U/L (ref 0–44)
AST: 15 U/L (ref 15–41)
Albumin: 3 g/dL — ABNORMAL LOW (ref 3.5–5.0)
Alkaline Phosphatase: 39 U/L (ref 38–126)
Anion gap: 11 (ref 5–15)
BUN: 8 mg/dL (ref 8–23)
CO2: 26 mmol/L (ref 22–32)
Calcium: 8.8 mg/dL — ABNORMAL LOW (ref 8.9–10.3)
Chloride: 102 mmol/L (ref 98–111)
Creatinine, Ser: 1.66 mg/dL — ABNORMAL HIGH (ref 0.44–1.00)
GFR, Estimated: 34 mL/min — ABNORMAL LOW (ref >60)
Glucose, Bld: 86 mg/dL (ref 70–99)
Potassium: 3.1 mmol/L — ABNORMAL LOW (ref 3.5–5.1)
Sodium: 139 mmol/L (ref 135–145)
Total Bilirubin: 1.1 mg/dL (ref 0.3–1.2)
Total Protein: 7.1 g/dL (ref 6.5–8.1)

## 2022-06-25 LAB — CBC WITH DIFFERENTIAL/PLATELET
Abs Immature Granulocytes: 0.01 10*3/uL (ref 0.00–0.07)
Basophils Absolute: 0 10*3/uL (ref 0.0–0.1)
Basophils Relative: 1 %
Eosinophils Absolute: 0.2 10*3/uL (ref 0.0–0.5)
Eosinophils Relative: 3 %
HCT: 32.4 % — ABNORMAL LOW (ref 36.0–46.0)
Hemoglobin: 10.4 g/dL — ABNORMAL LOW (ref 12.0–15.0)
Immature Granulocytes: 0 %
Lymphocytes Relative: 24 %
Lymphs Abs: 1.3 10*3/uL (ref 0.7–4.0)
MCH: 28.9 pg (ref 26.0–34.0)
MCHC: 32.1 g/dL (ref 30.0–36.0)
MCV: 90 fL (ref 80.0–100.0)
Monocytes Absolute: 0.6 10*3/uL (ref 0.1–1.0)
Monocytes Relative: 11 %
Neutro Abs: 3.3 10*3/uL (ref 1.7–7.7)
Neutrophils Relative %: 61 %
Platelets: 210 10*3/uL (ref 150–400)
RBC: 3.6 MIL/uL — ABNORMAL LOW (ref 3.87–5.11)
RDW: 13.4 % (ref 11.5–15.5)
WBC: 5.4 10*3/uL (ref 4.0–10.5)
nRBC: 0 % (ref 0.0–0.2)

## 2022-06-25 LAB — HEPATITIS B DNA, ULTRAQUANTITATIVE, PCR
HBV DNA SERPL PCR-ACNC: NOT DETECTED IU/mL
HBV DNA SERPL PCR-LOG IU: UNDETERMINED log10 IU/mL

## 2022-06-25 LAB — RPR: RPR Ser Ql: NONREACTIVE

## 2022-06-25 MED ORDER — HYDRALAZINE HCL 20 MG/ML IJ SOLN: 5.0000 mg | Freq: Four times a day (QID) | INTRAMUSCULAR | Status: DC | PRN

## 2022-06-25 MED ORDER — TRAMADOL HCL 50 MG PO TABS
50.0000 mg | ORAL_TABLET | Freq: Once | ORAL | Status: AC | PRN
  Administered 2022-06-25: 50 mg via ORAL
  Filled 2022-06-25: qty 1

## 2022-06-25 MED ORDER — THIAMINE HCL 100 MG/ML IJ SOLN: 100.0000 mg | Freq: Every day | INTRAMUSCULAR | Status: DC

## 2022-06-25 MED ORDER — THIAMINE HCL 100 MG/ML IJ SOLN
500.0000 mg | Freq: Three times a day (TID) | INTRAVENOUS | Status: AC
  Administered 2022-06-25 – 2022-06-26 (×2): 500 mg via INTRAVENOUS
  Filled 2022-06-25 (×3): qty 5

## 2022-06-25 MED ORDER — POTASSIUM CHLORIDE CRYS ER 20 MEQ PO TBCR
30.0000 meq | EXTENDED_RELEASE_TABLET | Freq: Two times a day (BID) | ORAL | Status: AC
  Administered 2022-06-25 – 2022-06-26 (×2): 30 meq via ORAL
  Filled 2022-06-25 (×2): qty 1

## 2022-06-25 MED ORDER — LEVETIRACETAM IN NACL 500 MG/100ML IV SOLN
500.0000 mg | Freq: Two times a day (BID) | INTRAVENOUS | Status: DC
  Administered 2022-06-25 – 2022-06-27 (×5): 500 mg via INTRAVENOUS
  Filled 2022-06-25 (×5): qty 100

## 2022-06-25 MED ORDER — CHLORHEXIDINE GLUCONATE CLOTH 2 % EX PADS
6.0000 | MEDICATED_PAD | Freq: Every day | CUTANEOUS | Status: DC
  Administered 2022-06-25 – 2022-06-29 (×5): 6 via TOPICAL

## 2022-06-25 MED ORDER — THIAMINE HCL 100 MG/ML IJ SOLN
250.0000 mg | Freq: Every day | INTRAVENOUS | Status: DC
  Administered 2022-06-26: 250 mg via INTRAVENOUS
  Filled 2022-06-25 (×3): qty 2.5

## 2022-06-25 NOTE — Social Work (Signed)
CSW acknowledges consult for substance use counseling and resources. Pt is currently not appropriate for assessment due to ongoing disorientation. Resources added to pt's AVS for convenience. TOC will continue to follow for further needs.

## 2022-06-25 NOTE — Procedures (Signed)
Patient Name: [name]  MRN: [id number]  Epilepsy Attending: Lora Havens  Referring Physician/Provider: Vashti Hey, MD  Duration: 06/24/2022 1502 to 06/25/2022 1502  Patient history: 66 y.o. female with PMH significant for breast cancer s/p mastectomy, hx of CKD 3, HTN, EtOh use with withdrawal seizures, morbid obesity, chronic diastolic CHF who has postictal confusion and agitation after she had 2 seizures and was given 5 mg of Versed by EMS yesterday.  EEG to evaluate for seizure.  Level of alertness: lethargic   AEDs during EEG study: LEV  Technical aspects: This EEG study was done with scalp electrodes positioned according to the 10-20 International system of electrode placement. Electrical activity was reviewed with band pass filter of 1-'70Hz'$ , sensitivity of 7 uV/mm, display speed of 58m/sec with a '60Hz'$  notched filter applied as appropriate. EEG data were recorded continuously and digitally stored.  Video monitoring was available and reviewed as appropriate.  Description: At the beginning of the study, no clear posterior dominant rhythm was seen.  EEG showed continuous generalized 3-5 Hz theta-delta slowing admixed with generalized periodic discharges with triphasic morphology at 1 to 1.5 Hz, more prominent when awake/stimulated.  Gradually, EEG improved and showed posterior dominant rhythm of 8-9 Hz activity of moderate voltage (25-35 uV) seen predominantly in posterior head regions, symmetric and reactive to eye opening and eye closing. Sleep was characterized by vertex waves, sleep spindles (12 to 14 Hz), maximal frontocentral region.  EEG also showed intermittent generalized rhythmic 2 to 3 Hz delta slowing with triphasic morphology. Hyperventilation and photic stimulation were not performed.     ABNORMALITY - Periodic discharges with triphasic morphology, generalized ( GPDs) - Continuous slow, generalized  IMPRESSION: This study showed generalized periodic  discharges with triphasic morphology which can be on the ictal- interictal continuum.  However the morphology, frequency and reactivity to stimulation is more commonly indicative of toxic-metabolic causes.  Additionally there is moderate diffuse encephalopathy, nonspecific etiology.  No seizures were seen during the study.  Demarquis Osley OBarbra Sarks

## 2022-06-25 NOTE — Progress Notes (Signed)
LTM maintenance done on leads A1,A2, F8, F4, CZ, C3, P4 and F3, all under 10, and no skin breakdown at all skin sites

## 2022-06-25 NOTE — Progress Notes (Signed)
Progress Note   Patient: [name] UXN:[id number] DOB: [birth year] DOA: 06/23/2022     1 DOS: the patient was seen and examined on 06/25/2022   Brief hospital course: [name] is a 66 y.o. female with medical history significant of EtOH abuse, EtOH withdrawal seizures (3 admits thus far this year for same including 2 in the past 4 months), CKD 3b, compensated cirrhosis, breast CA.   Pt in to ED today after seizure at home.  EMS called, second seizure en route with EMS got '5mg'$  versed.   Per family: patient has been trying to quit alcohol but relapses and hides alcohol intake.  Patient has not had any more recurrent seizures here.  She however is scoring positive on CIWA protocol with 12 being the highest and received Ativan overnight.  This morning patient is still does not follow commands.  Neurologist evaluated the patient EEG was done which showed signs of generalized encephalopathy that was metabolic in nature.    Assessment and Plan: * Alcohol withdrawal seizure ([place]) Pt with seizure and prolonged post-ictal state / delirium again.  Extensive history of same with EtOH withdrawal in past.  (3 prior admits this year alone for same including 2 within past 4 months). DDx includes development of seizure disorder due to prolonged EtOH use (not yet clear which at this point). -Initial CT negative for any acute abnormality. - Patient has not had any more recurrent seizures - Neurologist evaluated, EEG done showing moderate diffuse encephalopathy, nonspecific etiology and no seizure firings were identified. - Still scoring positive on CIWA protocol, treating with as needed Ativan - Dose of Keppra reduced to 500 mg twice daily - Continue thiamine and folate - MRI of the brain is currently still pending.    HBV (hepatitis B virus) infection Looks like pt has history of HBV with positive core AB and antigen, but neg for surface antibody. Not clear if she was ever treated for  this. Will check HBV viral load.  Stage 3b chronic kidney disease (CKD) (HCC) Creat 1.8 today appears baseline. Creatinine improved to 1.66 Potassium is 3.1, replenished Check BMP tomorrow  Cirrhosis of liver without ascites (HCC) Will check ammonia, but hasnt been elevated during prior admits. Likely due to extensive EtOH use history, and looks like shes had / has HBV as well.  Anemia of chronic disease HGB 10.7 today appears baseline.  Malignant neoplasm of upper-outer quadrant of left breast in female, estrogen receptor positive ([place]) Needs to follow up with Dr. Chryl Heck, looks like Dr. Chryl Heck recently tried to call patient and schedule appointment. Port is chronically thrombosed.        Subjective: Patient was seen and examined at bedside today.  Patient appears to be drowsy but able to respond to commands.  She was still in her mittens but stays confused.  Physical Exam: Vitals:   06/25/22 0210 06/25/22 0413 06/25/22 0825 06/25/22 1155  BP:  (!) 153/87  (!) 157/89  Pulse: 89  85 89  Resp: '20 17 20 20  '$ Temp:  98 F (36.7 C) 98.4 F (36.9 C) 98.6 F (37 C)  TempSrc:   Oral Oral  SpO2:  100% 99% 99%   Constitutional: Somnolent Eyes: PERRL, lids and conjunctivae normal ENMT: Mucous membranes are moist. Posterior pharynx clear of any exudate or lesions.Normal dentition.  Neck: normal, supple, no masses, no thyromegaly Respiratory: clear to auscultation bilaterally, no wheezing, no crackles. Normal respiratory effort. No accessory muscle use.  Cardiovascular: Regular rate and rhythm, no  murmurs / rubs / gallops. No extremity edema. 2+ pedal pulses. No carotid bruits.  Abdomen: no tenderness, no masses palpated. No hepatosplenomegaly. Bowel sounds positive.  Musculoskeletal: no clubbing / cyanosis. No joint deformity upper and lower extremities. Good ROM, no contractures. Normal muscle tone.  Skin: no rashes, lesions, ulcers. No induration Neurologic: MAE, localizes but not  following commands Psychiatric: Somnolent, makes eye contact on both sides before dozing off.   Data Reviewed:  Reviewed CMP shows a potassium of 3.1, creatinine slowly improving.  Family Communication: To the patient's son over the phone  Disposition: Status is: Inpatient Remains inpatient appropriate because: Secondary to alcohol withdrawal symptoms with possibility of seizures.  Planned Discharge Destination: Home with Home Health    Time spent: 35 minutes  Author: Carlyle Lipa, MD 06/25/2022 3:38 PM  For on call review [email or web address].

## 2022-06-25 NOTE — Progress Notes (Signed)
   06/25/22 0030  Assess: MEWS Score  Temp 98.3 F (36.8 C)  BP (!) 176/80 (MD aware no new orders)  MAP (mmHg) 103  Pulse Rate 89  Resp 20  Level of Consciousness Alert  SpO2 100 %  O2 Device Room Air  Assess: MEWS Score  MEWS Temp 0  MEWS Systolic 0  MEWS Pulse 0  MEWS RR 0  MEWS LOC 0  MEWS Score 0  MEWS Score Color Green  Notify: Provider  Provider Name/Title V. Rathore  Date Provider Notified 06/25/22  Time Provider Notified [phone]  Method of Notification  (amion)  Notification Reason Other (Comment) (diet. pain. bp elevated)  Provider response No new orders  Date of Provider Response 06/25/22  Time of Provider Response 0100  Assess: SIRS CRITERIA  SIRS Temperature  0  SIRS Pulse 0  SIRS Respirations  0  SIRS WBC 1  SIRS Score Sum  1

## 2022-06-25 NOTE — Plan of Care (Signed)
Pt alert to self. Unable to understand care/dx/tx. Pt has had pain in legs. Tylenol given ineffective. MD ordered tramadol which has been effective. After pain controlled bp improved. Hydralazine prn for SBP over 160.  Problem: Safety: Goal: Non-violent Restraint(s) Outcome: Completed/Met Restraints off only mitts.  Problem: Education: Goal: Knowledge of General Education information will improve Description: Including pain rating scale, medication(s)/side effects and non-pharmacologic comfort measures Outcome: Not Progressing   Problem: Health Behavior/Discharge Planning: Goal: Ability to manage health-related needs will improve Outcome: Not Progressing   Problem: Clinical Measurements: Goal: Ability to maintain clinical measurements within normal limits will improve Outcome: Progressing Goal: Will remain free from infection Outcome: Progressing Goal: Diagnostic test results will improve Outcome: Progressing Goal: Respiratory complications will improve Outcome: Progressing Goal: Cardiovascular complication will be avoided Outcome: Progressing   Problem: Activity: Goal: Risk for activity intolerance will decrease Outcome: Progressing   Problem: Nutrition: Goal: Adequate nutrition will be maintained Outcome: Progressing   Problem: Coping: Goal: Level of anxiety will decrease Outcome: Progressing   Problem: Elimination: Goal: Will not experience complications related to bowel motility Outcome: Progressing Goal: Will not experience complications related to urinary retention Outcome: Progressing   Problem: Pain Managment: Goal: General experience of comfort will improve Outcome: Progressing   Problem: Safety: Goal: Ability to remain free from injury will improve Outcome: Progressing   Problem: Skin Integrity: Goal: Risk for impaired skin integrity will decrease Outcome: Progressing

## 2022-06-25 NOTE — Progress Notes (Signed)
Mdplaced orders for additional pain medication. Tramadol ordered and given. Hydralazine ordered for bp over 160. BP did not meet parameters.    06/25/22 0413  Vitals  BP (!) 153/87  MAP (mmHg) 105  ECG Heart Rate 86  Resp 17  MEWS COLOR  MEWS Score Color Green  MEWS Score  MEWS Temp 0  MEWS Systolic 0  MEWS Pulse 0  MEWS RR 0  MEWS LOC 0  MEWS Score 0

## 2022-06-25 NOTE — Progress Notes (Signed)
Attempted to obtain pt's currently ordered MRI. Full MRI would take ~35 minutes as ordered. Pt premedicated prior to coming down per RN. Unable to obtain any diagnostic images. Upon attempting to scan, pt yelling and flailing arms and legs near continuously. Pt required to be flat and remain completely still for exam. Scan aborted for pt safety and undiagnostic image quality.

## 2022-06-25 NOTE — Progress Notes (Addendum)
NEUROLOGY CONSULTATION PROGRESS NOTE   Date of service: June 25, 2022 Patient Name: [name] MRN:  [id number] DOB:  [birth year]  Brief HPI  [name] is a 66 y.o. female with PMH significant for  has a past medical history of Alcohol dependence ([place]), Aortic atherosclerosis ([place]), Arthritis, Cancer ([place]) (11/2019), Chronic heart failure ([place]), Chronic kidney disease, stage 3 ([place]), COVID-19, Diverticulitis, Family history of ovarian cancer, Heart murmur, Hypertension, Right nephrolithiasis, Thoracic aortic aneurysm ([place]), and Tubular adenoma polyp of rectum. who presents with a seizure at home and another witnessed seizure by EMS in route to ED requiring 5 mg of Versed. Patient was loaded with Keppra 150 mg IV and continue on maintenance 1 g IV every 12h.   Interval Hx   Patient is seen at bedside.  She is very somnolent but awakes to verbal stimulation.  She falls back to sleep very quickly.  She is oriented to person and place only, not to time.  Her speech was quite difficult to understand.  Vitals   Vitals:   06/25/22 0030 06/25/22 0210 06/25/22 0413 06/25/22 0825  BP: (!) 176/80  (!) 153/87   Pulse: 89 89  85  Resp: '20 20 17 20  '$ Temp: 98.3 F (36.8 C)  98 F (36.7 C) 98.4 F (36.9 C)  TempSrc: Oral   Oral  SpO2: 100%  100% 99%     There is no height or weight on file to calculate BMI.  Physical Exam   Physical Exam Constitutional:      General: She is not in acute distress.    Comments: Oriented to person and place  HENT:     Head: Normocephalic.  Eyes:     General:        Right eye: No discharge.        Left eye: No discharge.     Conjunctiva/sclera: Conjunctivae normal.     Pupils: Pupils are equal, round, and reactive to light.  Cardiovascular:     Rate and Rhythm: Normal rate and regular rhythm.  Pulmonary:     Effort: Pulmonary effort is normal. No respiratory distress.  Musculoskeletal:     Cervical back: Normal range of motion.  Skin:     General: Skin is warm.      Neurologic Examination  Mental status/Cognition: Alert, oriented to self, place.  Poor attention Speech/language: Speech was intangible   Unable to perform a full neurological exam due to patient's mental status -PERRLA -Able to lift her arms up against gravity.  Labs   Basic Metabolic Panel:  Lab Results  Component Value Date   NA 136 06/24/2022   K 3.4 (L) 06/24/2022   CO2 24 06/24/2022   GLUCOSE 105 (H) 06/24/2022   BUN 14 06/24/2022   CREATININE 1.72 (H) 06/24/2022   CALCIUM 9.1 06/24/2022   GFRNONAA 32 (L) 06/24/2022   GFRAA 40 (L) 06/26/2020   HbA1c:  Lab Results  Component Value Date   HGBA1C 5.0 09/01/2021   LDL:  Lab Results  Component Value Date   LDLCALC 96 02/10/2020   Urine Drug Screen:     Component Value Date/Time   LABOPIA NONE DETECTED 04/05/2022 0109   COCAINSCRNUR NONE DETECTED 04/05/2022 0109   COCAINSCRNUR NEG 05/17/2008 2055   LABBENZ NONE DETECTED 04/05/2022 0109   LABBENZ NEG 05/17/2008 2055   AMPHETMU NONE DETECTED 04/05/2022 0109   THCU POSITIVE (A) 04/05/2022 0109   LABBARB NONE DETECTED 04/05/2022 0109    Alcohol Level  Component Value Date/Time   ETH <10 06/23/2022 2225   No results found for: "PHENYTOIN", "ZONISAMIDE", "LAMOTRIGINE", "LEVETIRACETA" No results found for: "PHENYTOIN", "PHENOBARB", "VALPROATE", "CBMZ"  Imaging and Diagnostic studies  Results for orders placed during the hospital encounter of 06/23/22  CT HEAD WO CONTRAST (5MM)  Narrative CLINICAL DATA:  Seizure, altered mental status  EXAM: CT HEAD WITHOUT CONTRAST  TECHNIQUE: Contiguous axial images were obtained from the base of the skull through the vertex without intravenous contrast.  RADIATION DOSE REDUCTION: This exam was performed according to the departmental dose-optimization program which includes automated exposure control, adjustment of the mA and/or kV according to patient size and/or use of iterative  reconstruction technique.  COMPARISON:  04/04/2022 03/2022  FINDINGS: Brain: No evidence of acute infarction, hemorrhage, hydrocephalus, extra-axial collection or mass lesion/mass effect. Unchanged encephalomalacia of the bilateral occipital lobes (series 4, image 14).  Vascular: No hyperdense vessel or unexpected calcification.  Skull: Normal. Negative for fracture or focal lesion.  Sinuses/Orbits: No acute finding.  Other: None.  IMPRESSION: 1.  No acute intracranial pathology.  2.  Unchanged encephalomalacia of the bilateral occipital lobes.   Electronically Signed By: Delanna Ahmadi M.D. On: 06/24/2022 08:23   Impression   [name] is a 66 y.o. female with PMH significant for breast cancer status postmastectomy, CKD 3, alcohol use disorder with withdrawal seizure, HFpEF, who presents with 2 seizures requiring 5 mg of Versed with postictal confusion.   Overall her clinical picture are consistent with alcohol withdrawal seizure and possible DT.  Not consistent with hepatic encephalopathy, ammonia WNL.  She has no other severe electrolyte derangement.  TSH WNL. Patient currently not tachycardic or tachypneic.  Afebrile overnight.  CIWA was highest (12) at 12 AM this morning, which she received 2 mg Ativan.  Unclear last alcoholic drink.    EEG was negative for seizure but showed triphasic morphology with could be seen in ictal state.  We will obtain brain MRI to rule evaluate any other underlying cause of her seizure.  Pending RPR. We will continue CIWA protocol with Keppra now.  The decision of continuing [place] after discharge for based on the MRI brain and her clinical improvement.  Recommendations   -Pending brain MRI -Continue CIWA with Ativan protocol -Reduce Keppra to 500 mg BID to reduce sedation effect -High-dose thiamine and folate ______________________________________________________________________   Thank you for the opportunity to take part in the  care of this patient. If you have any further questions, please contact the neurology consultation attending.  Signed,  Gaylan Gerold, DO Internal Medicine Residency My pager: [phone]   Neurology Attending Attestation   I examined the patient and discussed plan with Dr. Alfonse Spruce resident. Below note flex my findings and recommendations with the following exceptions:  I was able to speak to patient's family at length today and they state that they are absolutely certain that she has not had anything to drink since her last hospital admission approximately 1 month ago.  This suggest that she may be having seizures that are not secondary to alcohol withdrawal.  She has had an MRI brain's in the past all these have been without contrast.  I have ordered an MRI brain with and without contrast to complete seizure work-up.  She has been on continuous EEG with no nonconvulsive seizures although she has had triphasic waves.  These are typically associated with metabolic encephalopathy however hers have been continuous and at times up to 2 Hz therefore on the ictal interictal continuum  and we will continue her on EEG for 1 more day.  If she is no seizures by tomorrow morning then EEG may be discontinued.  She remains somnolent which may be partially due to the Ativan however I think that it is reasonable to also decrease her Keppra dose to 500 mg twice daily to see if this improves her alertness.  She should be continued on Keppra indefinitely at this point given her history of multiple seizures and the fact that the most recent seizure does not seem to have been precipitated by alcohol withdrawal per family. She has some swelling of her left upper extremity which is likely secondary to chronic lymphedema with arm in dependent position however we will order a left upper extremity ultrasound to rule out DVT.  We will continue to follow.    Su Monks, MD [facility] [phone]   If 7pm- 7am,  please page neurology on call as listed in [place].

## 2022-06-25 NOTE — Discharge Instructions (Signed)
                  Intensive Outpatient Programs  [facility]    [facility] [address]     [address] [place],  [place]     [place], [place] [phone]      [phone]  [facility]   [facility]  (Inpatient and outpatient)  [phone] (Suboxone and Methadone) [address]           [phone]           [facility]    [facility] - Intensive Outpatient [address]     [address] [place], [place] [address]     [place], [place]  [phone]      [phone]  [facility] (Outpatient, Inpatient, [facility]  [facility] (Groups and [facility]idental) (insurance only) [phone]    [place], [place]          [phone]       [facility]    [facility] (for caregivers and family) [address]     [address] [place], [place]     [place], [place] [phone]      [phone]  Residential Treatment Programs  [facility]  [facility](2 years) Residential: 90 days)  [facility] [address]      [address] [place], [place]     [place], [place] [phone]      [phone] or [phone]  [facility]    [facility] [address]      [address] [place], [place]     [place], [place] [phone]      [phone]  [facility]   [facility] ([facility]) [address]     [address] [place], [place] [address]     [place], [place] [phone]      [phone] Admissions: 8am-3pm M-F  [facility]: Residential Program (90 Days)              [facility]: [facility]  [place], [place]     [place], [place]  [phone] or [phone]    (Walk in Hours over the weekend or by referral)   [place]: [facility]:[phone] (for crisis  response 24 hours a day) 

## 2022-06-26 ENCOUNTER — Inpatient Hospital Stay (HOSPITAL_COMMUNITY): Payer: Medicare Other

## 2022-06-26 DIAGNOSIS — D638 Anemia in other chronic diseases classified elsewhere: Secondary | ICD-10-CM | POA: Diagnosis not present

## 2022-06-26 DIAGNOSIS — R609 Edema, unspecified: Secondary | ICD-10-CM | POA: Diagnosis not present

## 2022-06-26 DIAGNOSIS — R569 Unspecified convulsions: Secondary | ICD-10-CM | POA: Diagnosis not present

## 2022-06-26 DIAGNOSIS — K7030 Alcoholic cirrhosis of liver without ascites: Secondary | ICD-10-CM | POA: Diagnosis not present

## 2022-06-26 DIAGNOSIS — F10931 Alcohol use, unspecified with withdrawal delirium: Secondary | ICD-10-CM | POA: Diagnosis not present

## 2022-06-26 DIAGNOSIS — M7989 Other specified soft tissue disorders: Secondary | ICD-10-CM | POA: Diagnosis not present

## 2022-06-26 LAB — CBC WITH DIFFERENTIAL/PLATELET
Abs Immature Granulocytes: 0.01 10*3/uL (ref 0.00–0.07)
Basophils Absolute: 0 10*3/uL (ref 0.0–0.1)
Basophils Relative: 0 %
Eosinophils Absolute: 0.1 10*3/uL (ref 0.0–0.5)
Eosinophils Relative: 3 %
HCT: 32.4 % — ABNORMAL LOW (ref 36.0–46.0)
Hemoglobin: 10.5 g/dL — ABNORMAL LOW (ref 12.0–15.0)
Immature Granulocytes: 0 %
Lymphocytes Relative: 28 %
Lymphs Abs: 1.4 10*3/uL (ref 0.7–4.0)
MCH: 29.1 pg (ref 26.0–34.0)
MCHC: 32.4 g/dL (ref 30.0–36.0)
MCV: 89.8 fL (ref 80.0–100.0)
Monocytes Absolute: 0.5 10*3/uL (ref 0.1–1.0)
Monocytes Relative: 10 %
Neutro Abs: 3 10*3/uL (ref 1.7–7.7)
Neutrophils Relative %: 59 %
Platelets: 189 10*3/uL (ref 150–400)
RBC: 3.61 MIL/uL — ABNORMAL LOW (ref 3.87–5.11)
RDW: 13.3 % (ref 11.5–15.5)
WBC: 5.1 10*3/uL (ref 4.0–10.5)
nRBC: 0 % (ref 0.0–0.2)

## 2022-06-26 LAB — COMPREHENSIVE METABOLIC PANEL
ALT: 7 U/L (ref 0–44)
AST: 13 U/L — ABNORMAL LOW (ref 15–41)
Albumin: 2.9 g/dL — ABNORMAL LOW (ref 3.5–5.0)
Alkaline Phosphatase: 36 U/L — ABNORMAL LOW (ref 38–126)
Anion gap: 9 (ref 5–15)
BUN: 7 mg/dL — ABNORMAL LOW (ref 8–23)
CO2: 25 mmol/L (ref 22–32)
Calcium: 8.3 mg/dL — ABNORMAL LOW (ref 8.9–10.3)
Chloride: 105 mmol/L (ref 98–111)
Creatinine, Ser: 1.67 mg/dL — ABNORMAL HIGH (ref 0.44–1.00)
GFR, Estimated: 34 mL/min — ABNORMAL LOW (ref >60)
Glucose, Bld: 81 mg/dL (ref 70–99)
Potassium: 3.8 mmol/L (ref 3.5–5.1)
Sodium: 139 mmol/L (ref 135–145)
Total Bilirubin: 0.8 mg/dL (ref 0.3–1.2)
Total Protein: 6.9 g/dL (ref 6.5–8.1)

## 2022-06-26 NOTE — Procedures (Addendum)
Patient Name: [name]  MRN: [id number]  Epilepsy Attending: Lora Havens  Referring Physician/Provider: Vashti Hey, MD  Duration: 06/25/2022 1839 to 06/26/2022 1839   Patient history: 67 y.o. female with PMH significant for breast cancer s/p mastectomy, hx of CKD 3, HTN, EtOh use with withdrawal seizures, morbid obesity, chronic diastolic CHF who has postictal confusion and agitation after she had 2 seizures and was given 5 mg of Versed by EMS yesterday.  EEG to evaluate for seizure.   Level of alertness: awake, asleep   AEDs during EEG study: LEV   Technical aspects: This EEG study was done with scalp electrodes positioned according to the 10-20 International system of electrode placement. Electrical activity was reviewed with band pass filter of 1-'70Hz'$ , sensitivity of 7 uV/mm, display speed of 105m/sec with a '60Hz'$  notched filter applied as appropriate. EEG data were recorded continuously and digitally stored.  Video monitoring was available and reviewed as appropriate.   Description: The posterior dominant rhythm consists of 8-9 Hz activity of moderate voltage (25-35 uV) seen predominantly in posterior head regions, symmetric and reactive to eye opening and eye closing. Sleep was characterized by vertex waves, sleep spindles (12 to 14 Hz), maximal frontocentral region. EEG also showed intermittent generalized rhythmic 2 to 3 Hz delta slowing and generalized periodic with triphasic morphology at '1Hz'$ .  Hyperventilation and photic stimulation were not performed.     EEG was disconnected from 1520 to 1839 for MRI brain   ABNORMALITY - Periodic discharges with triphasic morphology, generalized ( GPDs) - Continuous slow, generalized   IMPRESSION: This study is suggestive of moderate diffuse encephalopathy, likely due to toxic-metabolic causes.  No seizures were seen during the study.   Daysha Ashmore OBarbra Sarks

## 2022-06-26 NOTE — Progress Notes (Signed)
Progress Note   Patient: [name] WYO:[id number] DOB: August 09, 1956 DOA: 06/23/2022     2 DOS: the patient was seen and examined on 06/26/2022   Brief hospital course: Mrs. Degroote was admitted to the hospital with the working diagnosis of alcohol withdrawal seizures.   66 yo female with the past medical history of etho abuse, alcohol withdrawal seizures, liver cirrhosis and CKD who presented with seizures. Patient had a seizure at home and EMS was called, She received 5 mg midazolam and was transported to the ED. On her initial physical examination her blood pressure was 157/81, HR 93, RR 15 and 02 saturation 97%, lungs with no wheezing or rales, heart with S1 and S2 present and rhythmic, abdomen with no distention and no lower extremity edema.   Na 136, K 3,4 CL 97 bicarbonate 24 glucose 105 bun 14 cr 1,72 Wbc 6.9 hgb 10,8 plt 206  Alcohol <10   Head CT with no acute changes, unchanged encephalomalacia of the bilateral occipital lobes.   Chest radiograph with no infiltrates, right IJ port a Cath in place.   EKG 90 bpm, normal axis, normal intervals, sinus rhythm, with no significant ST segment or T wave changes.   Patient was placed on alcohol withdrawal protocol with benzodiazepines with good toleration.  Neurology was consulted for EEG.  Continue keppra and high dose thiamine Follow up with brain MRI.   Assessment and Plan: * Alcohol withdrawal seizure ([place]) Acute metabolic encephalopathy.   Patient with continuous EEG monitoring Patient is awake and alert, tolerating po well No tremors or confusion, no agitation.   Continue with alcohol withdrawal protocol per CIWA.  Neuro checks per unit protocol.  Continue keppra and follow up on Brain MRI.  High dose thiamine   Stage 3b chronic kidney disease (CKD) (HCC) Hypokalemia   Renal function with serum cr at 1,67 with K at 3,8 and serum bicarbonate at 25 Plan to continue close monitoring of renal function and electrolytes.   Patient is tolerating po well.   HBV (hepatitis B virus) infection Follow up on Hep B viral load.   Anemia of chronic disease HGB 10.7 today appears baseline. Close follow up on Hgb   Malignant neoplasm of upper-outer quadrant of left breast in female, estrogen receptor positive ([place]) Needs to follow up with Dr. Chryl Heck, looks like Dr. Chryl Heck recently tried to call patient and schedule appointment. Port is chronically thrombosed.  Cirrhosis of liver without ascites (HCC) No clinical signs of decompensated liver failure Continue close monitoring of neuro checks.  Alcohol withdrawal protocol.         Subjective: Patient with no chest pain or dyspnea, awake and alert, tolerating po well.   Physical Exam: Vitals:   06/26/22 0404 06/26/22 0831 06/26/22 1200 06/26/22 1535  BP: (!) 146/87 (!) 142/87 133/87 139/75  Pulse: 91 93 80 77  Resp: '19 14 17 '$ (!) 23  Temp: 98 F (36.7 C) 98 F (36.7 C) 98 F (36.7 C) 98.1 F (36.7 C)  TempSrc: Oral Oral Oral Oral  SpO2: 100% 100% 100%    Neurology awake and alert ENT with mild pallor Cardiovascular with S1 and S2 present and rhythmic Respiratory with no rales or wheezing Abdomen with no distention  No lower extremity edema  Data Reviewed:    Family Communication: I spoke with patient's son at the bedside, we talked in detail about patient's condition, plan of care and prognosis and all questions were addressed.   Disposition: Status is: Inpatient Remains inpatient  appropriate because: alcohol withdrawal   Planned Discharge Destination: Home    Author: Tawni Millers, MD 06/26/2022 5:26 PM  For on call review [email or web address].

## 2022-06-26 NOTE — Assessment & Plan Note (Addendum)
Acute metabolic encephalopathy.   Patient with continuous EEG monitoring Patient is awake and alert, tolerating po well No tremors or confusion, no agitation.   Continue with alcohol withdrawal protocol per CIWA.  Neuro checks per unit protocol.  Continue keppra and follow up on Brain MRI.  High dose thiamine

## 2022-06-26 NOTE — Progress Notes (Signed)
LTM EEG discontinued - no skin breakdown at unhook.   

## 2022-06-26 NOTE — Procedures (Signed)
Patient Name: [name]  MRN: [id number]  Epilepsy Attending: Lora Havens  Referring Physician/Provider: Vashti Hey, MD  Duration: 06/26/2022 1839 to 06/26/2022 2210   Patient history: 66 y.o. female with PMH significant for breast cancer s/p mastectomy, hx of CKD 3, HTN, EtOh use with withdrawal seizures, morbid obesity, chronic diastolic CHF who has postictal confusion and agitation after she had 2 seizures and was given 5 mg of Versed by EMS yesterday.  EEG to evaluate for seizure.   Level of alertness: awake, asleep   AEDs during EEG study: LEV   Technical aspects: This EEG study was done with scalp electrodes positioned according to the 10-20 International system of electrode placement. Electrical activity was reviewed with band pass filter of 1-'70Hz'$ , sensitivity of 7 uV/mm, display speed of 1m/sec with a '60Hz'$  notched filter applied as appropriate. EEG data were recorded continuously and digitally stored.  Video monitoring was available and reviewed as appropriate.   Description: The posterior dominant rhythm consists of 8-9 Hz activity of moderate voltage (25-35 uV) seen predominantly in posterior head regions, symmetric and reactive to eye opening and eye closing. Sleep was characterized by vertex waves, sleep spindles (12 to 14 Hz), maximal frontocentral region. EEG also showed intermittent generalized rhythmic 2 to 3 Hz delta slowing and generalized periodic with triphasic morphology at '1Hz'$ .  Hyperventilation and photic stimulation were not performed.      ABNORMALITY - Periodic discharges with triphasic morphology, generalized ( GPDs) - Continuous slow, generalized   IMPRESSION: This study is suggestive of moderate diffuse encephalopathy, likely due to toxic-metabolic causes.  No seizures were seen during the study.   [name] OBarbra Sarks

## 2022-06-26 NOTE — Progress Notes (Signed)
Son is at bedside and reports the family works to ensure the patient doesn't drink alcohol, and report the patient hasn't had a drink since August of this year.

## 2022-06-26 NOTE — Hospital Course (Signed)
[name] was admitted to the hospital with the working diagnosis of alcohol withdrawal seizures.   66 yo female with the past medical history of etho abuse, alcohol withdrawal seizures, liver cirrhosis and CKD who presented with seizures. Patient had a seizure at home and EMS was called, She received 5 mg midazolam and was transported to the ED. On her initial physical examination her blood pressure was 157/81, HR 93, RR 15 and 02 saturation 97%, lungs with no wheezing or rales, heart with S1 and S2 present and rhythmic, abdomen with no distention and no lower extremity edema.   Na 136, K 3,4 CL 97 bicarbonate 24 glucose 105 bun 14 cr 1,72 Wbc 6.9 hgb 10,8 plt 206  Alcohol <10   Head CT with no acute changes, unchanged encephalomalacia of the bilateral occipital lobes.   Chest radiograph with no infiltrates, right IJ port a Cath in place.   EKG 90 bpm, normal axis, normal intervals, sinus rhythm, with no significant ST segment or T wave changes.   Patient was placed on alcohol withdrawal protocol with benzodiazepines with good toleration.  Neurology was consulted for EEG.  Continue keppra and high dose thiamine Follow up with brain MRI.

## 2022-06-27 ENCOUNTER — Inpatient Hospital Stay (HOSPITAL_COMMUNITY): Payer: Medicare Other

## 2022-06-27 DIAGNOSIS — K7030 Alcoholic cirrhosis of liver without ascites: Secondary | ICD-10-CM | POA: Diagnosis not present

## 2022-06-27 DIAGNOSIS — F10931 Alcohol use, unspecified with withdrawal delirium: Secondary | ICD-10-CM | POA: Diagnosis not present

## 2022-06-27 DIAGNOSIS — D638 Anemia in other chronic diseases classified elsewhere: Secondary | ICD-10-CM | POA: Diagnosis not present

## 2022-06-27 DIAGNOSIS — R569 Unspecified convulsions: Secondary | ICD-10-CM | POA: Diagnosis not present

## 2022-06-27 LAB — BASIC METABOLIC PANEL
Anion gap: 11 (ref 5–15)
BUN: 9 mg/dL (ref 8–23)
CO2: 25 mmol/L (ref 22–32)
Calcium: 8.6 mg/dL — ABNORMAL LOW (ref 8.9–10.3)
Chloride: 103 mmol/L (ref 98–111)
Creatinine, Ser: 1.64 mg/dL — ABNORMAL HIGH (ref 0.44–1.00)
GFR, Estimated: 34 mL/min — ABNORMAL LOW (ref >60)
Glucose, Bld: 87 mg/dL (ref 70–99)
Potassium: 3.7 mmol/L (ref 3.5–5.1)
Sodium: 139 mmol/L (ref 135–145)

## 2022-06-27 LAB — MAGNESIUM: Magnesium: 1.5 mg/dL — ABNORMAL LOW (ref 1.7–2.4)

## 2022-06-27 MED ORDER — TRAZODONE HCL 50 MG PO TABS
50.0000 mg | ORAL_TABLET | Freq: Every day | ORAL | Status: DC
  Administered 2022-06-27 – 2022-06-28 (×2): 50 mg via ORAL
  Filled 2022-06-27 (×2): qty 1

## 2022-06-27 MED ORDER — MAGNESIUM SULFATE 4 GM/100ML IV SOLN
4.0000 g | Freq: Once | INTRAVENOUS | Status: AC
  Administered 2022-06-27: 4 g via INTRAVENOUS
  Filled 2022-06-27: qty 100

## 2022-06-27 MED ORDER — CARVEDILOL 12.5 MG PO TABS
12.5000 mg | ORAL_TABLET | Freq: Two times a day (BID) | ORAL | Status: DC
  Administered 2022-06-27 – 2022-06-29 (×5): 12.5 mg via ORAL
  Filled 2022-06-27 (×5): qty 1

## 2022-06-27 MED ORDER — POLYETHYLENE GLYCOL 3350 17 G PO PACK
17.0000 g | PACK | Freq: Every day | ORAL | Status: DC
  Administered 2022-06-27 – 2022-06-28 (×2): 17 g via ORAL
  Filled 2022-06-27 (×3): qty 1

## 2022-06-27 MED ORDER — AMLODIPINE BESYLATE 10 MG PO TABS
10.0000 mg | ORAL_TABLET | Freq: Every day | ORAL | Status: DC
  Administered 2022-06-27 – 2022-06-29 (×3): 10 mg via ORAL
  Filled 2022-06-27 (×3): qty 1

## 2022-06-27 MED ORDER — ADULT MULTIVITAMIN W/MINERALS CH
1.0000 | ORAL_TABLET | Freq: Every day | ORAL | Status: DC
  Administered 2022-06-28 – 2022-06-29 (×2): 1 via ORAL
  Filled 2022-06-27 (×3): qty 1

## 2022-06-27 MED ORDER — ALBUTEROL SULFATE (2.5 MG/3ML) 0.083% IN NEBU: 2.5000 mg | INHALATION_SOLUTION | Freq: Four times a day (QID) | RESPIRATORY_TRACT | Status: DC | PRN

## 2022-06-27 MED ORDER — LORAZEPAM 1 MG PO TABS: 1.0000 mg | ORAL_TABLET | Freq: Four times a day (QID) | ORAL | Status: DC | PRN

## 2022-06-27 NOTE — Progress Notes (Addendum)
Progress Note   Patient: [name] CXK:[id number] DOB: Aug 15, 1956 DOA: 06/23/2022     3 DOS: the patient was seen and examined on 06/27/2022   Brief hospital course: Mrs. [name] was admitted to the hospital with the working diagnosis of alcohol withdrawal seizures.   66 yo female with the past medical history of etho abuse, alcohol withdrawal seizures, liver cirrhosis and CKD who presented with seizures. Patient had a seizure at home and EMS was called, She received 5 mg midazolam and was transported to the ED. On her initial physical examination her blood pressure was 157/81, HR 93, RR 15 and 02 saturation 97%, lungs with no wheezing or rales, heart with S1 and S2 present and rhythmic, abdomen with no distention and no lower extremity edema.   Na 136, K 3,4 CL 97 bicarbonate 24 glucose 105 bun 14 cr 1,72 Wbc 6.9 hgb 10,8 plt 206  Alcohol <10   Head CT with no acute changes, unchanged encephalomalacia of the bilateral occipital lobes.   Chest radiograph with no infiltrates, right IJ port a Cath in place.   EKG 90 bpm, normal axis, normal intervals, sinus rhythm, with no significant ST segment or T wave changes.   Patient was placed on alcohol withdrawal protocol with benzodiazepines with good toleration.  Neurology was consulted  EEG with no seizures.  Continue keppra and high dose thiamine Follow up with brain MRI.     Assessment and Plan: * Alcohol withdrawal seizure ([place]) Acute metabolic encephalopathy.   EEG with moderate diffuse encephalopathy, likely due to toxic metabolic causes. No seizures seen during the study.  Patient continue to be awake and alert, tolerating po well No tremors or confusion, no agitation.   Patient has been on CIWA protocol for alcohol withdrawal symptoms, now transitioned to only as needed lorazepam po 1 mg.  Neuro checks per unit protocol.  Continue keppra and follow up on Brain MRI.  Continue with dose thiamine  Patient taking acomprosate  at home for alcohol abuse.  Holding on gabapentin for now.   Stage 3b chronic kidney disease (CKD) (HCC) Hypokalemia, hypomagnesemia.   No signs of volume overload.  Renal function stable with serum cr at 1,64, K is 3,7 and serum bicarbonate at 25. Na 139 and Mg 1,5   Add 4 g mag sulfate and follow up electrolytes in am. Patient is tolerating po well.  Continue to hold on loop diuretic therapy for now.   HBV (hepatitis B virus) infection Follow up on Hep B viral load.   Anemia of chronic disease HGB 10.7 at baseline.   Malignant neoplasm of upper-outer quadrant of left breast in female, estrogen receptor positive ([place]) Needs to follow up with Dr. Chryl Heck, looks like Dr. Chryl Heck recently tried to call patient and schedule appointment. Port is chronically thrombosed.  Cirrhosis of liver without ascites (HCC) No clinical signs of decompensated liver failure Continue close monitoring of neuro checks.  Will need outpatient follow up.   Essential hypertension Systolic blood pressure 970 to 147 mmHg,  Plan to resume amlodipine and carvedilol, continue close blood pressure monitoring.         Subjective: Patient with no chest pain or dyspnea, awake and alert, no tremors or seizures   Physical Exam: Vitals:   06/26/22 1938 06/26/22 2333 06/27/22 0344 06/27/22 0801  BP: 131/73 (!) 145/72 (!) 145/80 (!) 147/79  Pulse: 87 91 84 83  Resp: 18 (!) '23 16 16  '$ Temp: 98.5 F (36.9 C) 98.6 F (37 C)  98.8 F (37.1 C) 97.9 F (36.6 C)  TempSrc: Oral Oral Oral Oral  SpO2: 100% 98% 99% 98%   Neurology awake and alert, non focal ENT with mild pallor Cardiovascular with S1 and S2 present and rhythmic with no gallops, rubs or murmurs Respiratory with no rales or wheezing Abdomen with no distention  No lower extremity edema  Data Reviewed:    Family Communication: no family at the bedside   Disposition: Status is: Inpatient Remains inpatient appropriate because: pending brain  MRI   Planned Discharge Destination: Home    Author: Tawni Millers, MD 06/27/2022 11:06 AM  For on call review [email or web address].

## 2022-06-27 NOTE — Progress Notes (Signed)
NEUROLOGY CONSULTATION PROGRESS NOTE   Date of service: June 27, 2022 Patient Name: [name] MRN:  [id number] DOB:  1956/04/25  Brief HPI  [name] is a 67 y.o. female with PMH significant for  has a past medical history of Alcohol dependence ([place]), Aortic atherosclerosis ([place]), Arthritis, Cancer ([place]) (11/2019), Chronic heart failure ([place]), Chronic kidney disease, stage 3 ([place]), COVID-19, Diverticulitis, Family history of ovarian cancer, Heart murmur, Hypertension, Right nephrolithiasis, Thoracic aortic aneurysm ([place]), and Tubular adenoma polyp of rectum. who presents with a seizure at home and another witnessed seizure by EMS in route to ED requiring 5 mg of Versed. Patient was loaded with Keppra 150 mg IV and continue on maintenance 1 g IV every 12h.   Interval Hx   Patient is seen at bedside.  She is very somnolent but awakes to verbal stimulation.  She falls back to sleep very quickly.  She is oriented to person and place only, not to time.  Her speech was quite difficult to understand.  Vitals   Vitals:   06/26/22 1535 06/26/22 1938 06/26/22 2333 06/27/22 0344  BP: 139/75 131/73 (!) 145/72 (!) 145/80  Pulse: 77 87 91 84  Resp: (!) 23 18 (!) 23 16  Temp: 98.1 F (36.7 C) 98.5 F (36.9 C) 98.6 F (37 C) 98.8 F (37.1 C)  TempSrc: Oral Oral Oral Oral  SpO2:  100% 98% 99%     There is no height or weight on file to calculate BMI.  Physical Exam   Physical Exam Constitutional:      General: She is not in acute distress.    Comments: Oriented to person and place  HENT:     Head: Normocephalic.  Eyes:     General:        Right eye: No discharge.        Left eye: No discharge.     Conjunctiva/sclera: Conjunctivae normal.     Pupils: Pupils are equal, round, and reactive to light.  Cardiovascular:     Rate and Rhythm: Normal rate and regular rhythm.  Pulmonary:     Effort: Pulmonary effort is normal. No respiratory distress.  Musculoskeletal:     Cervical  back: Normal range of motion.  Skin:    General: Skin is warm.      Neurologic Examination  Mental status/Cognition: Alert, oriented to self, place.  Poor attention Speech/language: Speech was intangible   Unable to perform a full neurological exam due to patient's mental status -PERRLA -Able to lift her arms up against gravity.  Labs   Basic Metabolic Panel:  Lab Results  Component Value Date   NA 139 06/26/2022   K 3.8 06/26/2022   CO2 25 06/26/2022   GLUCOSE 81 06/26/2022   BUN 7 (L) 06/26/2022   CREATININE 1.67 (H) 06/26/2022   CALCIUM 8.3 (L) 06/26/2022   GFRNONAA 34 (L) 06/26/2022   GFRAA 40 (L) 06/26/2020   HbA1c:  Lab Results  Component Value Date   HGBA1C 5.0 09/01/2021   LDL:  Lab Results  Component Value Date   LDLCALC 96 02/10/2020   Urine Drug Screen:     Component Value Date/Time   LABOPIA NONE DETECTED 04/05/2022 0109   COCAINSCRNUR NONE DETECTED 04/05/2022 0109   COCAINSCRNUR NEG 05/17/2008 2055   LABBENZ NONE DETECTED 04/05/2022 0109   LABBENZ NEG 05/17/2008 2055   AMPHETMU NONE DETECTED 04/05/2022 0109   THCU POSITIVE (A) 04/05/2022 0109   LABBARB NONE DETECTED 04/05/2022 0109  Alcohol Level     Component Value Date/Time   ETH <10 06/23/2022 2225   No results found for: "PHENYTOIN", "ZONISAMIDE", "LAMOTRIGINE", "LEVETIRACETA" No results found for: "PHENYTOIN", "PHENOBARB", "VALPROATE", "CBMZ"  Imaging and Diagnostic studies  Results for orders placed during the hospital encounter of 06/23/22  CT HEAD WO CONTRAST (5MM)  Narrative CLINICAL DATA:  Seizure, altered mental status  EXAM: CT HEAD WITHOUT CONTRAST  TECHNIQUE: Contiguous axial images were obtained from the base of the skull through the vertex without intravenous contrast.  RADIATION DOSE REDUCTION: This exam was performed according to the departmental dose-optimization program which includes automated exposure control, adjustment of the mA and/or kV according  to patient size and/or use of iterative reconstruction technique.  COMPARISON:  04/04/2022 03/2022  FINDINGS: Brain: No evidence of acute infarction, hemorrhage, hydrocephalus, extra-axial collection or mass lesion/mass effect. Unchanged encephalomalacia of the bilateral occipital lobes (series 4, image 14).  Vascular: No hyperdense vessel or unexpected calcification.  Skull: Normal. Negative for fracture or focal lesion.  Sinuses/Orbits: No acute finding.  Other: None.  IMPRESSION: 1.  No acute intracranial pathology.  2.  Unchanged encephalomalacia of the bilateral occipital lobes.   Electronically Signed By: Delanna Ahmadi M.D. On: 06/24/2022 08:23   Impression   [name] is a 66 y.o. female with PMH significant for breast cancer status postmastectomy, CKD 3, alcohol use disorder with withdrawal seizure, HFpEF, who presents with 2 seizures requiring 5 mg of Versed with postictal confusion.   Overall her clinical picture are consistent with alcohol withdrawal seizure and possible DT.  Not consistent with hepatic encephalopathy, ammonia WNL.  She has no other severe electrolyte derangement.  TSH WNL. Patient currently not tachycardic or tachypneic.  Afebrile overnight.  CIWA was highest (12) at 12 AM this morning, which she received 2 mg Ativan.  Unclear last alcoholic drink.    EEG was negative for seizures since hookup but did show triphasics c/w metabolic encephalopathy  We will obtain brain MRI to rule evaluate any other underlying cause of her seizure.  Pending RPR. We will continue CIWA protocol with Keppra now.  The decision of continuing [place] after discharge for based on the MRI brain and her clinical improvement.  Recommendations   - Pending brain MRI - patient did not tolerate today, will defer until mental status improves, which it is slowly doing - Continue CIWA with Ativan protocol - D/c LTM EEG - Keppra reduced to '500mg'$  bid 10/27 in hopes of reducing  sedation - She should be continued on Keppra indefinitely at this point given her history of multiple seizures and the fact that the most recent seizure does not seem to have been precipitated by alcohol withdrawal per family.  - Wernicke dosing thiamine regimen, taper as scheduled + folate - LUE [place] pending for swelling  Su Monks, MD [facility] [phone]  If 7pm- 7am, please page neurology on call as listed in [place].

## 2022-06-27 NOTE — Progress Notes (Signed)
In regards to pt's currently ordered MRI, was able to obtain vast majority of scan before pt began moving arms, legs and head continuously to the point where following imaging became nondiagnostic, motion reduction sequences were used, made multiple attempts to verbally redirect pt to no avail, best possible images obtained. Order changed to [place] contrast to reflect pt's inability to reach contrasted portion of exam. Exam completed as a limited study.

## 2022-06-27 NOTE — Assessment & Plan Note (Signed)
Systolic blood pressure 721 to 147 mmHg,  Plan to resume amlodipine and carvedilol, continue close blood pressure monitoring.

## 2022-06-28 DIAGNOSIS — F10931 Alcohol use, unspecified with withdrawal delirium: Secondary | ICD-10-CM | POA: Diagnosis not present

## 2022-06-28 DIAGNOSIS — R569 Unspecified convulsions: Secondary | ICD-10-CM | POA: Diagnosis not present

## 2022-06-28 LAB — BASIC METABOLIC PANEL
Anion gap: 9 (ref 5–15)
BUN: 9 mg/dL (ref 8–23)
CO2: 26 mmol/L (ref 22–32)
Calcium: 8.9 mg/dL (ref 8.9–10.3)
Chloride: 105 mmol/L (ref 98–111)
Creatinine, Ser: 1.53 mg/dL — ABNORMAL HIGH (ref 0.44–1.00)
GFR, Estimated: 37 mL/min — ABNORMAL LOW (ref >60)
Glucose, Bld: 94 mg/dL (ref 70–99)
Potassium: 3.7 mmol/L (ref 3.5–5.1)
Sodium: 140 mmol/L (ref 135–145)

## 2022-06-28 LAB — MAGNESIUM: Magnesium: 2.1 mg/dL (ref 1.7–2.4)

## 2022-06-28 MED ORDER — LEVETIRACETAM 500 MG PO TABS
500.0000 mg | ORAL_TABLET | Freq: Two times a day (BID) | ORAL | Status: DC
  Administered 2022-06-28 – 2022-06-29 (×3): 500 mg via ORAL
  Filled 2022-06-28 (×3): qty 1

## 2022-06-28 NOTE — Progress Notes (Signed)
PROGRESS NOTE    [name]  TDV:[id number] DOB: [birth year] DOA: 06/23/2022 PCP: [name], MD   Brief Narrative: 66 year old with past medical history significant for EtOH, last drink in July, prior history of alcohol withdrawal seizure, liver cirrhosis, CKD who presents with seizures.  Patient had a seizure at home, EMS was contacted, she received 5 mg of midazolam.  CT head no acute changes, unchanged encephalomalacia of  bilateral occipital lobes.  Chest x-ray with no infiltrates, right IJ port cath in place.  Neurology was consulted.  EEG with no seizures.  Patient was a started on Keppra.    Assessment & Plan:   Principal Problem:   Alcohol withdrawal seizure ([place]) Active Problems:   Stage 3b chronic kidney disease (CKD) (HCC)   Anemia of chronic disease   HBV (hepatitis B virus) infection   Malignant neoplasm of upper-outer quadrant of left breast in female, estrogen receptor positive ([place])   Cirrhosis of liver without ascites ([place])   Essential hypertension  1-Seizure: EEG with moderate diffuse encephalopathy likely toxic metabolic causes. Started on Keppra, continue MRI showed bilateral encephalomalacia in the occipital pole this increased risk for seizure. Neurology recommend patient to continue Keppra indefinitely 500 twice daily.   2-Stage IIIb CKD Hypokalemia, hypomagnesemia Electrolytes replace Holding loop diuretics for now Cr  1.6, prior creatinine 1.6--1.8   HBV infection: Follow up a thyroid viral load prior, need to follow-up with ID  Anemia of chronic disease: Hemoglobin stable  Malignant neoplasm of upper outer quadrant of the left breast, estrogen receptor positive Needs to follow-up with Dr. Chryl Heck.   Cirrhosis of the liver without ascites: HTN: Continue amlodipine and carvedilol      Estimated body mass index is 56.57 kg/m as calculated from the following:   Height as of 04/02/22: '5\' 1"'$  (1.549 m).   Weight as of 04/04/22: 135.8  kg.   DVT prophylaxis: Lovenox Code Status: Full code Family Communication: Care discussed with patient Disposition Plan:  Status is: Inpatient Remains inpatient appropriate because: Management of seizures    Consultants:  Neurology  Procedures:    Antimicrobials:  None  Subjective: She is feeling okay, she had some right foot pain on ambulation.  She was only able to get through the door in her room.  If her mobility does not improve she may need rehab.  Plan to get therapy to check on her again. Has not drink alcohol since July  Objective: Vitals:   06/28/22 0109 06/28/22 0428 06/28/22 0816 06/28/22 1237  BP: 131/71 131/74 135/73 (!) 148/87  Pulse: 80 79 88 82  Resp: '18 16 16 17  '$ Temp:  98.4 F (36.9 C) 98.6 F (37 C) 98.1 F (36.7 C)  TempSrc:  Oral Oral Oral  SpO2:  100% 100% 99%    Intake/Output Summary (Last 24 hours) at 06/28/2022 1516 Last data filed at 06/28/2022 0817 Gross per 24 hour  Intake 350 ml  Output 550 ml  Net -200 ml   There were no vitals filed for this visit.  Examination:  General exam: Appears calm and comfortable  Respiratory system: Clear to auscultation. Respiratory effort normal. Cardiovascular system: S1 & S2 heard, RRR. No JVD, murmurs, rubs, gallops or clicks. No pedal edema. Gastrointestinal system: Abdomen is nondistended, soft and nontender. No organomegaly or masses felt. Normal bowel sounds heard. Central nervous system: Alert and oriented. No focal neurological deficits. Extremities: Symmetric 5 x 5 power. Skin: No rashes, lesions or ulcers Psychiatry: Judgement and insight appear normal. Mood &  affect appropriate.     Data Reviewed: I have personally reviewed following labs and imaging studies  CBC: Recent Labs  Lab 06/23/22 2225 06/24/22 0350 06/25/22 0830 06/26/22 0500  WBC 6.5 6.9 5.4 5.1  NEUTROABS 4.9  --  3.3 3.0  HGB 10.7* 10.8* 10.4* 10.5*  HCT 32.6* 32.4* 32.4* 32.4*  MCV 90.3 88.8 90.0 89.8  PLT  212 206 210 573   Basic Metabolic Panel: Recent Labs  Lab 06/23/22 2225 06/24/22 0350 06/25/22 0830 06/26/22 0500 06/27/22 0740 06/28/22 0548  NA 137 136 139 139 139 140  K 3.5 3.4* 3.1* 3.8 3.7 3.7  CL 98 97* 102 105 103 105  CO2 '23 24 26 25 25 26  '$ GLUCOSE 130* 105* 86 81 87 94  BUN '15 14 8 '$ 7* 9 9  CREATININE 1.82* 1.72* 1.66* 1.67* 1.64* 1.53*  CALCIUM 8.9 9.1 8.8* 8.3* 8.6* 8.9  MG 1.6*  --   --   --  1.5* 2.1  PHOS 3.3  --   --   --   --   --    GFR: CrCl cannot be calculated (Unknown ideal weight.). Liver Function Tests: Recent Labs  Lab 06/23/22 2225 06/24/22 0350 06/25/22 0830 06/26/22 0500  AST '20 18 15 '$ 13*  ALT '6 8 7 7  '$ ALKPHOS 43 41 39 36*  BILITOT 1.0 1.2 1.1 0.8  PROT 7.5 7.6 7.1 6.9  ALBUMIN 3.2* 3.2* 3.0* 2.9*   No results for input(s): "LIPASE", "AMYLASE" in the last 168 hours. Recent Labs  Lab 06/24/22 0347  AMMONIA 18   Coagulation Profile: No results for input(s): "INR", "PROTIME" in the last 168 hours. Cardiac Enzymes: No results for input(s): "CKTOTAL", "CKMB", "CKMBINDEX", "TROPONINI" in the last 168 hours. BNP (last 3 results) No results for input(s): "PROBNP" in the last 8760 hours. HbA1C: No results for input(s): "HGBA1C" in the last 72 hours. CBG: Recent Labs  Lab 06/23/22 2232  GLUCAP 122*   Lipid Profile: No results for input(s): "CHOL", "HDL", "LDLCALC", "TRIG", "CHOLHDL", "LDLDIRECT" in the last 72 hours. Thyroid Function Tests: No results for input(s): "TSH", "T4TOTAL", "FREET4", "T3FREE", "THYROIDAB" in the last 72 hours. Anemia Panel: No results for input(s): "VITAMINB12", "FOLATE", "FERRITIN", "TIBC", "IRON", "RETICCTPCT" in the last 72 hours. Sepsis Labs: No results for input(s): "PROCALCITON", "LATICACIDVEN" in the last 168 hours.  No results found for this or any previous visit (from the past 240 hour(s)).       Radiology Studies: MR BRAIN WO CONTRAST  Result Date: 06/27/2022 CLINICAL DATA:  Seizure  disorder. Clinical change. Patient had 2 seizures. EXAM: MRI HEAD WITHOUT CONTRAST TECHNIQUE: Multiplanar, multiecho pulse sequences of the brain and surrounding structures were obtained without intravenous contrast. COMPARISON:  CT head without contrast 06/04/2022 MR head without contrast 02/06/2022 FINDINGS: Brain: Chronic encephalomalacia of the occipital poles bilaterally is stable. No acute infarct, hemorrhage, or mass lesion is present. No other significant white matter disease is present. The ventricles are of normal size. No significant extraaxial fluid collection is present. Deep brain nuclei are within normal limits. The internal auditory canals are within normal limits. The brainstem and cerebellum are within normal limits. Dedicated imaging of the temporal lobes demonstrates symmetric size and signal of the hippocampal structures. Vascular: Flow is present in the major intracranial arteries. Skull and upper cervical spine: The craniocervical junction is normal. Upper cervical spine is within normal limits. Marrow signal is unremarkable. Sinuses/Orbits: The paranasal sinuses and mastoid air cells are clear. The globes and orbits  are within normal limits. IMPRESSION: 1. Stable chronic encephalomalacia of the occipital poles bilaterally. 2. No acute intracranial abnormality or significant interval change. Electronically Signed   By: San Morelle M.D.   On: 06/27/2022 13:01        Scheduled Meds:  amLODipine  10 mg Oral Daily   carvedilol  12.5 mg Oral BID   Chlorhexidine Gluconate Cloth  6 each Topical Daily   enoxaparin (LOVENOX) injection  40 mg Subcutaneous W29H   folic acid  1 mg Oral Daily   levETIRAcetam  500 mg Oral BID   multivitamin with minerals  1 tablet Oral Daily   polyethylene glycol  17 g Oral Daily   [START ON 07/02/2022] thiamine (VITAMIN B1) injection  100 mg Intravenous Daily   traZODone  50 mg Oral QHS   Continuous Infusions:  thiamine (VITAMIN B1) injection 250  mg (06/28/22 1006)     LOS: 4 days    Time spent: 35  minutes    Angelyna Henderson A Sonji Starkes, MD [facility]   If 7PM-7AM, please contact night-coverage [email or web address]  06/28/2022, 3:16 PM

## 2022-06-28 NOTE — Progress Notes (Signed)
Mobility Specialist: Progress Note   06/28/22 1241  Mobility  Activity Ambulated with assistance to bathroom  Level of Assistance Minimal assist, patient does 75% or more  Assistive Device Front wheel walker  Distance Ambulated (ft) 24 ft (12'x2)  Activity Response Tolerated well  Mobility Referral Yes  $Mobility charge 1 Mobility   Pt received in the chair and requesting to use BR. Assisted to [facility] with help from RN. STS x2 in BR d/t need for seated break during pericare secondary to feeling light headed. Pt to bed after per request. Pt has call bell and phone in reach.   [name] Mobility Specialist Secure Chat Only

## 2022-06-28 NOTE — Evaluation (Signed)
Occupational Therapy Evaluation Patient Details Name: [name] MRN: [id number] DOB: 1956-08-11 Today's Date: 06/28/2022   History of Present Illness 66 y.o. female in to ED 06/24/22 after seizure at home. CT head was negative; EEG done showing moderate diffuse encephalopathy, nonspecific etiology and no seizure firings were identified; MRI brain showed bilateral encephalomalacia in occipital poles which is a risk for secondary epileptic seizures.  PMH significant of EtOH abuse, EtOH withdrawal seizures (3 admits thus far this year for same including 2 in the past 4 months), CKD 3b, compensated cirrhosis, Lt breast CA, CHF, HTN.   Clinical Impression   At baseline pt has assistance from her family for ADL/IADL tasks and states "someone is always with me". Pt most likely close to baseline however feels "weaker since I have been in bed since Friday". Discussed option of using her wc until she feels her strength has improved to reduce risk of falls.  Pt verbalized understanding. Acute OT to follow to facilitate safe DC home.      Recommendations for follow up therapy are one component of a multi-disciplinary discharge planning process, led by the attending physician.  Recommendations may be updated based on patient status, additional functional criteria and insurance authorization.   Follow Up Recommendations  No OT follow up    Assistance Recommended at Discharge Frequent or constant Supervision/Assistance  Patient can return home with the following A little help with walking and/or transfers;A lot of help with bathing/dressing/bathroom;Direct supervision/assist for medications management;Assistance with cooking/housework;Direct supervision/assist for financial management;Assist for transportation;Help with stairs or ramp for entrance    Functional Status Assessment  Patient has had a recent decline in their functional status and demonstrates the ability to make significant improvements in  function in a reasonable and predictable amount of time.  Equipment Recommendations  None recommended by OT    Recommendations for Other Services       Precautions / Restrictions Precautions Precautions: Other (comment) Precaution Comments: seizure precautions Restrictions Weight Bearing Restrictions: No      Mobility Bed Mobility Overal bed mobility: Modified Independent             General bed mobility comments: pt sleeps in recliner at home, however with HOB slightly elevated and use of rail able to come to sit modified independent    Transfers Overall transfer level: Modified independent Equipment used: Rolling walker (2 wheels)               General transfer comment: pt places one hand up on RW as comes to stand; no tipping of RW noted x 2 reps      Balance Overall balance assessment: Modified Independent                                         ADL either performed or assessed with clinical judgement   ADL Overall ADL's : Needs assistance/impaired Eating/Feeding: Set up   Grooming: Set up   Upper Body Bathing: Set up   Lower Body Bathing: Moderate assistance   Upper Body Dressing : Set up   Lower Body Dressing: Moderate assistance   Toilet Transfer: Minimal assistance   Toileting- Clothing Manipulation and Hygiene: Minimal assistance       Functional mobility during ADLs: Rolling walker (2 wheels);Modified independent       Vision Baseline Vision/History: 4 Cataracts       Perception  Praxis      Pertinent Vitals/Pain Pain Assessment Pain Assessment: Faces Faces Pain Scale: Hurts a little bit Pain Location: rt foot Pain Descriptors / Indicators: Discomfort, Grimacing, Guarding Pain Intervention(s): Limited activity within patient's tolerance     Hand Dominance     Extremity/Trunk Assessment Upper Extremity Assessment Upper Extremity Assessment: Generalized weakness   Lower Extremity Assessment Lower  Extremity Assessment: Defer to PT evaluation RLE Deficits / Details: pain in center of arch when standing; no pain when sitting   Cervical / Trunk Assessment Cervical / Trunk Assessment: Other exceptions Cervical / Trunk Exceptions: obesity   Communication Communication Communication: No difficulties   Cognition Arousal/Alertness: Awake/alert Behavior During Therapy: WFL for tasks assessed/performed Overall Cognitive Status: No family/caregiver present to determine baseline cognitive functioning                                 General Comments: most likley close to baseline; good awareness of situation; verbalized understanding for reocmmendation of using wc given increased weakness     General Comments  fatigued with activity; recommend wc primarily until she is strong enough to use her RW; verbalized understanding    Exercises     Shoulder Instructions      Home Living Family/patient expects to be discharged to:: Private residence Living Arrangements: Other relatives Advertising account executive) Available Help at Discharge: Family;Friend(s);Available 24 hours/day Type of Home: Apartment Home Access: Ramped entrance     Home Layout: One level     Bathroom Shower/Tub: Teacher, early years/pre: Standard Bathroom Accessibility: No   Home Equipment: Conservation officer, nature (2 wheels);Cane - single point;Wheelchair - manual;Tub bench   Additional Comments: no longer has an aide (per PT); pt told me that her niece is her aide; has friend who comes if grandaughter not there      Prior Functioning/Environment Prior Level of Function : Needs assist             Mobility Comments: modified independent with RW; only goes out for doctor appointments ADLs Comments: niece comes to assist with shower; grandaughter assists with dressing; does cooking and cleaning        OT Problem List: Decreased strength;Decreased activity tolerance;Impaired balance (sitting and/or  standing);Decreased safety awareness;Obesity;Pain      OT Treatment/Interventions: Self-care/ADL training;Therapeutic exercise;Energy conservation;DME and/or AE instruction;Therapeutic activities;Patient/family education;Balance training    OT Goals(Current goals can be found in the care plan section) Acute Rehab OT Goals Patient Stated Goal: to get better adn go home OT Goal Formulation: With patient Time For Goal Achievement: 07/12/22 Potential to Achieve Goals: Good  OT Frequency: Min 2X/week    Co-evaluation              AM-PAC OT "6 Clicks" Daily Activity     Outcome Measure Help from another person eating meals?: A Little Help from another person taking care of personal grooming?: A Little Help from another person toileting, which includes using toliet, bedpan, or urinal?: A Lot Help from another person bathing (including washing, rinsing, drying)?: A Lot Help from another person to put on and taking off regular upper body clothing?: A Little Help from another person to put on and taking off regular lower body clothing?: A Lot 6 Click Score: 15   End of Session Equipment Utilized During Treatment: Rolling walker (2 wheels) Nurse Communication: Mobility status  Activity Tolerance: Patient tolerated treatment well Patient left: in bed;with call bell/phone within  reach;with bed alarm set  OT Visit Diagnosis: Unsteadiness on feet (R26.81);Muscle weakness (generalized) (M62.81);Pain Pain - Right/Left: Right Pain - part of body: Ankle and joints of foot                Time: 1439-1501 OT Time Calculation (min): 22 min Charges:  OT General Charges $OT Visit: 1 Visit OT Evaluation $OT Eval Moderate Complexity: [place], OT/L   Acute OT Clinical Specialist [place] Pager [phone] Office [phone]   [facility] 06/28/2022, 4:37 PM

## 2022-06-28 NOTE — Evaluation (Signed)
Physical Therapy Evaluation Patient Details Name: [name] MRN: [id number] DOB: 09-Sep-1955 Today's Date: 06/28/2022  History of Present Illness  66 y.o. female in to ED 06/24/22 after seizure at home. CT head was negative; EEG done showing moderate diffuse encephalopathy, nonspecific etiology and no seizure firings were identified; MRI brain showed bilateral encephalomalacia in occipital poles which is a risk for secondary epileptic seizures.  PMH significant of EtOH abuse, EtOH withdrawal seizures (3 admits thus far this year for same including 2 in the past 4 months), CKD 3b, compensated cirrhosis, Lt breast CA, CHF, HTN.  Clinical Impression   Pt admitted secondary to problem above with deficits below. PTA patient was modified independent with use of RW and walking mostly household distances (able to walk to car and into MD office when needed).  Pt currently requires minguard assist x 14 ft with RW with limitation due to acute onset rt foot pain. Encouraged pt to continue to perform AROM rt ankle and foot, walk with nursing, OT, and hopefully mobility team. She hopes to be able to go home today or tomorrow. Anticipate patient will benefit from PT to address problems listed below.Will continue to follow acutely to maximize functional mobility independence and safety.          Recommendations for follow up therapy are one component of a multi-disciplinary discharge planning process, led by the attending physician.  Recommendations may be updated based on patient status, additional functional criteria and insurance authorization.  Follow Up Recommendations No PT follow up      Assistance Recommended at Discharge Intermittent Supervision/Assistance  Patient can return home with the following  A little help with walking and/or transfers;Assistance with cooking/housework;Assist for transportation;Help with stairs or ramp for entrance    Equipment Recommendations None recommended by PT   Recommendations for Other Services  OT consult    Functional Status Assessment Patient has had a recent decline in their functional status and demonstrates the ability to make significant improvements in function in a reasonable and predictable amount of time.     Precautions / Restrictions Precautions Precautions: Other (comment) Precaution Comments: seizure precautions Restrictions Weight Bearing Restrictions: No      Mobility  Bed Mobility Overal bed mobility: Modified Independent             General bed mobility comments: pt sleeps in recliner at home, however with HOB slightly elevated and use of rail able to come to sit modified independent    Transfers Overall transfer level: Modified independent Equipment used: Rolling walker (2 wheels)               General transfer comment: pt places one hand up on RW as comes to stand; no tipping of RW noted x 2 reps    Ambulation/Gait Ambulation/Gait assistance: Supervision Gait Distance (Feet): 14 Feet (7 fwd, 7 backward) Assistive device: Rolling walker (2 wheels) Gait Pattern/deviations: Step-to pattern, Decreased stride length Gait velocity: decr     General Gait Details: poor ability to offload RLE/foot by use of UEs on RW  Stairs            Wheelchair Mobility    Modified Rankin (Stroke Patients Only)       Balance Overall balance assessment: Modified Independent  Pertinent Vitals/Pain Pain Assessment Pain Assessment: Faces Faces Pain Scale: Hurts whole lot Pain Location: rt foot arch Pain Descriptors / Indicators: Discomfort, Grimacing, Guarding Pain Intervention(s): Limited activity within patient's tolerance, Monitored during session, Patient requesting pain meds-RN notified    Home Living Family/patient expects to be discharged to:: Private residence Living Arrangements: Other relatives Advertising account executive) Available Help at  Discharge: Family;Friend(s);Available 24 hours/day Type of Home: Apartment Home Access: Ramped entrance       Home Layout: One level Home Equipment: Conservation officer, nature (2 wheels);Cane - single point;Wheelchair - manual;Tub bench Additional Comments: no longer has an Engineer, production; has friend who comes if grandaughter not there    Prior Function Prior Level of Function : Needs assist             Mobility Comments: modified independent with RW; only goes out for doctor appointments ADLs Comments: niece comes to assist with shower; grandaughter assists with dressing; does cooking and cleaning     Hand Dominance        Extremity/Trunk Assessment   Upper Extremity Assessment Upper Extremity Assessment: Defer to OT evaluation    Lower Extremity Assessment Lower Extremity Assessment: RLE deficits/detail RLE Deficits / Details: pain in center of arch when standing; no pain when sitting    Cervical / Trunk Assessment Cervical / Trunk Assessment: Other exceptions Cervical / Trunk Exceptions: obesity  Communication   Communication: No difficulties  Cognition Arousal/Alertness: Awake/alert Behavior During Therapy: WFL for tasks assessed/performed Overall Cognitive Status: Within Functional Limits for tasks assessed                                 General Comments: oriented to person, DOB, month, nearest holiday, situation, place        General Comments General comments (skin integrity, edema, etc.): Patient not yet able to walk a long enough distance to get from car into apartment; pt hopeful to go home today or tomorrow    Exercises General Exercises - Lower Extremity Ankle Circles/Pumps: AROM, Right, 10 reps   Assessment/Plan    PT Assessment Patient needs continued PT services  PT Problem List Decreased activity tolerance;Decreased balance;Decreased mobility;Obesity;Pain       PT Treatment Interventions DME instruction;Gait training;Functional mobility  training;Therapeutic activities;Therapeutic exercise;Patient/family education    PT Goals (Current goals can be found in the Care Plan section)  Acute Rehab PT Goals Patient Stated Goal: go home PT Goal Formulation: With patient Time For Goal Achievement: 07/12/22 Potential to Achieve Goals: Good    Frequency Min 3X/week     Co-evaluation               AM-PAC PT "6 Clicks" Mobility  Outcome Measure Help needed turning from your back to your side while in a flat bed without using bedrails?: A Little Help needed moving from lying on your back to sitting on the side of a flat bed without using bedrails?: A Little Help needed moving to and from a bed to a chair (including a wheelchair)?: A Little Help needed standing up from a chair using your arms (e.g., wheelchair or bedside chair)?: A Little Help needed to walk in hospital room?: A Little Help needed climbing 3-5 steps with a railing? : Total 6 Click Score: 16    End of Session   Activity Tolerance: Patient limited by pain Patient left: in chair;with call bell/phone within reach;with chair alarm set Nurse Communication: Mobility status;Other (comment) (purewick not  placed due to soreness and knows when she needs to go) PT Visit Diagnosis: Other abnormalities of gait and mobility (R26.89);Pain Pain - Right/Left: Right Pain - part of body: Ankle and joints of foot    Time: 0925-1003 PT Time Calculation (min) (ACUTE ONLY): 38 min   Charges:   PT Evaluation $PT Eval Moderate Complexity: 1 Mod PT Treatments $Gait Training: 8-22 mins $Therapeutic Activity: 8-22 mins         Arby Barrette, PT Acute Rehabilitation Services  Office [phone]   Rexanne Mano 06/28/2022, 10:21 AM

## 2022-06-28 NOTE — Care Management Important Message (Signed)
Important Message  Patient Details  Name: [name] MRN: [id number] Date of Birth: 06/02/1956   Medicare Important Message Given:  Yes     Lamia Mariner [address] Circle 06/28/2022, 3:49 PM

## 2022-06-28 NOTE — Social Work (Signed)
CSW advised pt is Aox4 and appropriate for Substance Use counseling and resources. Pt states she has not had a drink since June 9th. She states she had a seizure after she stopped drinking and her brother was afraid this was a result of that as well.  Pt states she did not get any rehab, but just quit. She states she will start attending [place] meetings for support. CSW advised pt of resources in her AVS, pt notes understanding and no further needs.

## 2022-06-28 NOTE — Plan of Care (Signed)
Plan of care  Please see progress note from 10/27 for neurology findings and recommendations to date. Patient has hx EtOH use disorder and withdrawal seizures but per family she has not had EtOH in past month so current seizure does not appear to be in the setting of EtOH withdrawal. MRI brain was completed that showed bilateral encephalomalacia in occipital poles which is a risk for secondary epileptic seizures. Recommend continuation of keppra '500mg'$  bid indefinitely for seizure prophylaxis. No driving x6 mos after last seizure. Neurology to sign off, but please reconsult if additional neurologic concerns arise.  Su Monks, MD [facility] [phone]  If 7pm- 7am, please page neurology on call as listed in [facility].

## 2022-06-29 DIAGNOSIS — R569 Unspecified convulsions: Secondary | ICD-10-CM | POA: Diagnosis not present

## 2022-06-29 DIAGNOSIS — F10931 Alcohol use, unspecified with withdrawal delirium: Secondary | ICD-10-CM | POA: Diagnosis not present

## 2022-06-29 MED ORDER — LEVETIRACETAM 500 MG PO TABS: 500.0000 mg | ORAL_TABLET | Freq: Two times a day (BID) | ORAL | 6 refills | Status: DC

## 2022-06-29 MED ORDER — FOLIC ACID 1 MG PO TABS: 1.0000 mg | ORAL_TABLET | Freq: Every day | ORAL | 1 refills | Status: DC

## 2022-06-29 MED ORDER — HEPARIN SOD (PORK) LOCK FLUSH 100 UNIT/ML IV SOLN
500.0000 [IU] | INTRAVENOUS | Status: AC | PRN
  Administered 2022-06-29: 500 [IU]
  Filled 2022-06-29: qty 5

## 2022-06-29 MED ORDER — POLYETHYLENE GLYCOL 3350 17 G PO PACK: 17.0000 g | PACK | Freq: Every day | ORAL | 0 refills | Status: DC

## 2022-06-29 NOTE — Progress Notes (Signed)
PT Cancellation Note  Patient Details Name: [name] MRN: [id number] DOB: 1956-07-02   Cancelled Treatment:    Reason Eval/Treat Not Completed: Other (comment)  Patient requesting pain medication for her foot prior to working with PT. RN made aware.   [facility]  Office [phone]   Rexanne Mano 06/29/2022, 11:25 AM

## 2022-06-29 NOTE — Discharge Summary (Addendum)
Physician Discharge Summary   Patient: [name] MRN: [id number] DOB: Jul 24, 1956  Admit date:     06/23/2022  Discharge date: 06/29/22  Discharge Physician: Elmarie Shiley   PCP: [name], MD   Recommendations at discharge:    Needs follow up with Neurologist for new onset seizures.  Follow up with PCP for CKD>  Needs to follow up with oncology, Dr Chryl Heck.  Needs to follow up with ID for Hepatitis b.   Discharge Diagnoses:   Seizure  Active Problems:   Stage 3b chronic kidney disease (CKD) (HCC)   Anemia of chronic disease   HBV (hepatitis B virus) infection   Malignant neoplasm of upper-outer quadrant of left breast in female, estrogen receptor positive ([place])   Cirrhosis of liver without ascites ([place])   Essential hypertension  Resolved Problems:   * No resolved hospital problems. [facility] Course: 66 year old with past medical history significant for EtOH, last drink in July, prior history of alcohol withdrawal seizure, liver cirrhosis, CKD who presents with seizures.  Patient had a seizure at home, EMS was contacted, she received 5 mg of midazolam.  CT head no acute changes, unchanged encephalomalacia of  bilateral occipital lobes.  Chest x-ray with no infiltrates, right IJ port cath in place.  Neurology was consulted.  EEG with no seizures.  Patient was a started on Keppra.   Assessment and Plan: 1-Seizure: EEG with moderate diffuse encephalopathy likely toxic metabolic causes. Started on Keppra, continue MRI showed bilateral encephalomalacia in the occipital pole this increased risk for seizure. Neurology recommend patient to continue Keppra indefinitely 500 twice daily. Stable. No further episodes.    2-Stage IIIb CKD Hypokalemia, hypomagnesemia Electrolytes replace Cr  1.6, prior creatinine 1.6--1.8   cr stable. Resume lasix at discharge.    HBV infection: Follow up a thyroid viral load prior, need to follow-up with ID   Anemia of chronic  disease: Hemoglobin stable   Malignant neoplasm of upper outer quadrant of the left breast, estrogen receptor positive Needs to follow-up with Dr. Chryl Heck.    Cirrhosis of the liver without ascites: HTN: Continue amlodipine and carvedilol   Morbid Obesity; needs life style modifications.            Consultants: neurology  Procedures performed: EEG Disposition: Home Diet recommendation:  Discharge Diet Orders (From admission, onward)     Start     Ordered   06/29/22 0000  Diet - low sodium heart healthy        06/29/22 1248           Cardiac diet DISCHARGE MEDICATION: Allergies as of 06/29/2022   No Known Allergies      Medication List     STOP taking these medications    acamprosate 333 MG tablet Commonly known as: CAMPRAL   Vitamin D (Ergocalciferol) 1.25 MG (50000 UNIT) Caps capsule Commonly known as: DRISDOL       TAKE these medications    albuterol 108 (90 Base) MCG/ACT inhaler Commonly known as: VENTOLIN HFA Inhale 2 puffs into the lungs every 6 (six) hours as needed for wheezing or shortness of breath.   amLODipine 10 MG tablet Commonly known as: NORVASC Take 1 tablet (10 mg total) by mouth daily. What changed: when to take this   BIOTIN PO Take 1 tablet by mouth daily.   carvedilol 12.5 MG tablet Commonly known as: COREG Take 12.5 mg by mouth 2 (two) times daily.   folic acid 1 MG tablet Commonly known  as: FOLVITE Take 1 tablet (1 mg total) by mouth daily. Start taking on: June 30, 2022   furosemide 20 MG tablet Commonly known as: LASIX Take 1 tablet (20 mg total) by mouth daily. Please make overdue appt with Dr. Gasper Sells before anymore refills. Thank you 1st attempt What changed:  when to take this additional instructions   gabapentin 300 MG capsule Commonly known as: NEURONTIN Take 1 capsule (300 mg total) by mouth at bedtime. What changed: when to take this   levETIRAcetam 500 MG tablet Commonly known as:  KEPPRA Take 1 tablet (500 mg total) by mouth 2 (two) times daily.   multivitamin with minerals Tabs tablet Take 1 tablet by mouth daily.   polyethylene glycol 17 g packet Commonly known as: MIRALAX / GLYCOLAX Take 17 g by mouth daily. Start taking on: June 30, 2022   traMADol 50 MG tablet Commonly known as: ULTRAM Take 50 mg by mouth 3 (three) times daily as needed (pain).   traZODone 50 MG tablet Commonly known as: DESYREL Take 50 mg by mouth at bedtime.         Follow-up Information     Paliwal, Himanshu, MD Follow up in 2 week(s).   Specialty: Family Medicine Contact information: [place] [address] [phone]         Benay Pike, MD Follow up in 1 week(s).   Specialty: Hematology and Oncology Contact information: [place] [address] [phone]                Discharge Exam: There were no vitals filed for this visit. Stable.   Condition at discharge: stable  The results of significant diagnostics from this hospitalization (including imaging, microbiology, ancillary and laboratory) are listed below for reference.   Imaging Studies: VAS [place] UPPER EXTREMITY VENOUS DUPLEX  Result Date: 06/27/2022 UPPER VENOUS STUDY  Patient Name:  [name]  Date of Exam:   06/26/2022 Medical Rec #: [id number]      Accession #:    [id number] Date of Birth: 02/01/1956     Patient Gender: F Patient Age:   43 years Exam Location:  [facility] Procedure:      VAS [place] UPPER EXTREMITY VENOUS DUPLEX Referring Phys: Su Monks --------------------------------------------------------------------------------  Indications: Swelling, and Edema Risk Factors: PMH Breast cancer. Limitations: Body habitus and Morbid obesity. Performing Technologist: Oda Cogan RDMS, RVT  Examination Guidelines: A complete evaluation includes B-mode imaging, spectral Doppler, color Doppler, and power Doppler as needed of all accessible  portions of each vessel. Bilateral testing is considered an integral part of a complete examination. Limited examinations for reoccurring indications may be performed as noted.  Right Findings: +----------+------------+---------+-----------+----------+----------------+ RIGHT     CompressiblePhasicitySpontaneousProperties    Summary      +----------+------------+---------+-----------+----------+----------------+ Subclavian                                          obscured by tape +----------+------------+---------+-----------+----------+----------------+  Left Findings: +----------+------------+---------+-----------+----------+-------+ LEFT      CompressiblePhasicitySpontaneousPropertiesSummary +----------+------------+---------+-----------+----------+-------+ IJV           Full       Yes       Yes                      +----------+------------+---------+-----------+----------+-------+ Subclavian  Yes       Yes                      +----------+------------+---------+-----------+----------+-------+ Axillary      Full       Yes       Yes                      +----------+------------+---------+-----------+----------+-------+ Brachial      Full                                          +----------+------------+---------+-----------+----------+-------+ Radial        Full                                          +----------+------------+---------+-----------+----------+-------+ Ulnar         Full                                          +----------+------------+---------+-----------+----------+-------+ Cephalic      Full                                          +----------+------------+---------+-----------+----------+-------+ Basilic       Full                                          +----------+------------+---------+-----------+----------+-------+  Summary:  Left: No evidence of deep vein thrombosis in the upper extremity. No  evidence of superficial vein thrombosis in the upper extremity.  *See table(s) above for measurements and observations.  Diagnosing physician: Jamelle Haring Electronically signed by Jamelle Haring on 06/27/2022 at 2:45:10 PM.    Final    MR BRAIN WO CONTRAST  Result Date: 06/27/2022 CLINICAL DATA:  Seizure disorder. Clinical change. Patient had 2 seizures. EXAM: MRI HEAD WITHOUT CONTRAST TECHNIQUE: Multiplanar, multiecho pulse sequences of the brain and surrounding structures were obtained without intravenous contrast. COMPARISON:  CT head without contrast 06/04/2022 MR head without contrast 02/06/2022 FINDINGS: Brain: Chronic encephalomalacia of the occipital poles bilaterally is stable. No acute infarct, hemorrhage, or mass lesion is present. No other significant white matter disease is present. The ventricles are of normal size. No significant extraaxial fluid collection is present. Deep brain nuclei are within normal limits. The internal auditory canals are within normal limits. The brainstem and cerebellum are within normal limits. Dedicated imaging of the temporal lobes demonstrates symmetric size and signal of the hippocampal structures. Vascular: Flow is present in the major intracranial arteries. Skull and upper cervical spine: The craniocervical junction is normal. Upper cervical spine is within normal limits. Marrow signal is unremarkable. Sinuses/Orbits: The paranasal sinuses and mastoid air cells are clear. The globes and orbits are within normal limits. IMPRESSION: 1. Stable chronic encephalomalacia of the occipital poles bilaterally. 2. No acute intracranial abnormality or significant interval change. Electronically Signed   By: San Morelle M.D.   On: 06/27/2022 13:01   Overnight EEG with video  Result Date:  06/25/2022 Lora Havens, MD     06/26/2022  9:00 AM Patient Name: Saryiah Bencosme MRN: [id number] Epilepsy Attending: Lora Havens Referring Physician/Provider: Vashti Hey, MD Duration: 06/24/2022 1502 to 06/25/2022 1502 Patient history: 66 y.o. female with PMH significant for breast cancer s/p mastectomy, hx of CKD 3, HTN, EtOh use with withdrawal seizures, morbid obesity, chronic diastolic CHF who has postictal confusion and agitation after she had 2 seizures and was given 5 mg of Versed by EMS yesterday.  EEG to evaluate for seizure. Level of alertness: lethargic AEDs during EEG study: LEV Technical aspects: This EEG study was done with scalp electrodes positioned according to the 10-20 International system of electrode placement. Electrical activity was reviewed with band pass filter of 1-'70Hz'$ , sensitivity of 7 uV/mm, display speed of 28m/sec with a '60Hz'$  notched filter applied as appropriate. EEG data were recorded continuously and digitally stored.  Video monitoring was available and reviewed as appropriate. Description: At the beginning of the study, no clear posterior dominant rhythm was seen.  EEG showed continuous generalized 3-5 Hz theta-delta slowing admixed with generalized periodic discharges with triphasic morphology at 1 to 1.5 Hz, more prominent when awake/stimulated.  Gradually, EEG improved and showed posterior dominant rhythm of 8-9 Hz activity of moderate voltage (25-35 uV) seen predominantly in posterior head regions, symmetric and reactive to eye opening and eye closing. Sleep was characterized by vertex waves, sleep spindles (12 to 14 Hz), maximal frontocentral region.  EEG also showed intermittent generalized rhythmic 2 to 3 Hz delta slowing with triphasic morphology. Hyperventilation and photic stimulation were not performed.   ABNORMALITY - Periodic discharges with triphasic morphology, generalized ( GPDs) - Continuous slow, generalized IMPRESSION: This study showed generalized periodic discharges with triphasic morphology which can be on the ictal- interictal continuum.  However the morphology, frequency and reactivity to stimulation is more  commonly indicative of toxic-metabolic causes.  Additionally there is moderate diffuse encephalopathy, nonspecific etiology.  No seizures were seen during the study. [place]  [place] HEAD WO CONTRAST (5MM)  Result Date: 06/24/2022 CLINICAL DATA:  Seizure, altered mental status EXAM: CT HEAD WITHOUT CONTRAST TECHNIQUE: Contiguous axial images were obtained from the base of the skull through the vertex without intravenous contrast. RADIATION DOSE REDUCTION: This exam was performed according to the departmental dose-optimization program which includes automated exposure control, adjustment of the mA and/or kV according to patient size and/or use of iterative reconstruction technique. COMPARISON:  04/04/2022 03/2022 FINDINGS: Brain: No evidence of acute infarction, hemorrhage, hydrocephalus, extra-axial collection or mass lesion/mass effect. Unchanged encephalomalacia of the bilateral occipital lobes (series 4, image 14). Vascular: No hyperdense vessel or unexpected calcification. Skull: Normal. Negative for fracture or focal lesion. Sinuses/Orbits: No acute finding. Other: None. IMPRESSION: 1.  No acute intracranial pathology. 2.  Unchanged encephalomalacia of the bilateral occipital lobes. Electronically Signed   By: ADelanna AhmadiM.D.   On: 06/24/2022 08:23   DG Chest 1 View  Result Date: 06/23/2022 CLINICAL DATA:  Seizures, hypoxia. EXAM: CHEST  1 VIEW COMPARISON:  Chest radiograph dated April 04, 2022. FINDINGS: The heart is enlarged. Right IJ access MediPort with distal tip in the SVC. Low lung volumes with bibasilar atelectasis. No large pleural effusion or pneumothorax. Multiple surgical clips overlying the left axilla. No acute osseous abnormality. IMPRESSION: 1. Low lung volumes with bibasilar atelectasis. 2. Cardiomegaly. 3. Right IJ access MediPort with distal tip in the SVC. Electronically Signed   By: IJudye BosO.  On: 06/23/2022 22:41    Microbiology: Results for orders placed or  performed during the hospital encounter of 04/04/22  Urine Culture     Status: Abnormal   Collection Time: 04/05/22  1:12 AM   Specimen: Urine, Clean Catch  Result Value Ref Range Status   Specimen Description URINE, CLEAN CATCH  Final   Special Requests   Final    NONE Performed at [facility], [place] [address], [place], [place] [address]    Culture >=100,000 COLONIES/mL KLEBSIELLA PNEUMONIAE (A)  Final   Report Status 04/07/2022 FINAL  Final   Organism ID, Bacteria KLEBSIELLA PNEUMONIAE (A)  Final      Susceptibility   Klebsiella pneumoniae - MIC*    AMPICILLIN >=32 RESISTANT Resistant     CEFAZOLIN <=4 SENSITIVE Sensitive     CEFEPIME <=0.12 SENSITIVE Sensitive     CEFTRIAXONE <=0.25 SENSITIVE Sensitive     CIPROFLOXACIN <=0.25 SENSITIVE Sensitive     GENTAMICIN <=1 SENSITIVE Sensitive     IMIPENEM 0.5 SENSITIVE Sensitive     NITROFURANTOIN <=16 SENSITIVE Sensitive     TRIMETH/SULFA <=20 SENSITIVE Sensitive     AMPICILLIN/SULBACTAM 8 SENSITIVE Sensitive     PIP/TAZO <=4 SENSITIVE Sensitive     * >=100,000 COLONIES/mL KLEBSIELLA PNEUMONIAE  SARS Coronavirus 2 by RT PCR (hospital order, performed in [place] hospital lab) *cepheid single result test* Anterior Nasal Swab     Status: None   Collection Time: 04/05/22  2:57 AM   Specimen: Anterior Nasal Swab  Result Value Ref Range Status   SARS Coronavirus 2 by RT PCR NEGATIVE NEGATIVE Final    Comment: (NOTE) SARS-CoV-2 target nucleic acids are NOT DETECTED.  The SARS-CoV-2 RNA is generally detectable in upper and lower respiratory specimens during the acute phase of infection. The lowest concentration of SARS-CoV-2 viral copies this assay can detect is 250 copies / mL. A negative result does not preclude SARS-CoV-2 infection and should not be used as the sole basis for treatment or other patient management decisions.  A negative result may occur with improper specimen collection / handling, submission of specimen  other than nasopharyngeal swab, presence of viral mutation(s) within the areas targeted by this assay, and inadequate number of viral copies (<250 copies / mL). A negative result must be combined with clinical observations, patient history, and epidemiological information.  Fact Sheet for Patients:   https://www.patel.info/  Fact Sheet for Healthcare Providers: https://hall.com/  This test is not yet approved or  cleared by the [place] FDA and has been authorized for detection and/or diagnosis of SARS-CoV-2 by FDA under an Emergency Use Authorization (EUA).  This EUA will remain in effect (meaning this test can be used) for the duration of the COVID-19 declaration under Section 564(b)(1) of the Act, 21 U.S.C. section 360bbb-3(b)(1), unless the authorization is terminated or revoked sooner.  Performed at [facility], [place] [address], [place], [place] [address]   Culture, blood (Routine X 2) w Reflex to ID Panel     Status: None   Collection Time: 04/05/22  5:02 AM   Specimen: BLOOD RIGHT HAND  Result Value Ref Range Status   Specimen Description BLOOD RIGHT HAND  Final   Special Requests AEROBIC BOTTLE ONLY Blood Culture adequate volume  Final   Culture   Final    NO GROWTH 5 DAYS Performed at [facility], [place] [address], [place], [place] [address]    Report Status 04/10/2022 FINAL  Final  Labs: CBC: Recent Labs  Lab 06/23/22 2225 06/24/22 0350 06/25/22 0830 06/26/22 0500  WBC 6.5 6.9 5.4 5.1  NEUTROABS 4.9  --  3.3 3.0  HGB 10.7* 10.8* 10.4* 10.5*  HCT 32.6* 32.4* 32.4* 32.4*  MCV 90.3 88.8 90.0 89.8  PLT 212 206 210 568   Basic Metabolic Panel: Recent Labs  Lab 06/23/22 2225 06/24/22 0350 06/25/22 0830 06/26/22 0500 06/27/22 0740 06/28/22 0548  NA 137 136 139 139 139 140  K 3.5 3.4* 3.1* 3.8 3.7 3.7  CL 98 97* 102 105 103 105  CO2 '23 24 26 25 25 26  '$ GLUCOSE 130* 105* 86 81 87 94  BUN '15 14  8 '$ 7* 9 9  CREATININE 1.82* 1.72* 1.66* 1.67* 1.64* 1.53*  CALCIUM 8.9 9.1 8.8* 8.3* 8.6* 8.9  MG 1.6*  --   --   --  1.5* 2.1  PHOS 3.3  --   --   --   --   --    Liver Function Tests: Recent Labs  Lab 06/23/22 2225 06/24/22 0350 06/25/22 0830 06/26/22 0500  AST '20 18 15 '$ 13*  ALT '6 8 7 7  '$ ALKPHOS 43 41 39 36*  BILITOT 1.0 1.2 1.1 0.8  PROT 7.5 7.6 7.1 6.9  ALBUMIN 3.2* 3.2* 3.0* 2.9*   CBG: Recent Labs  Lab 06/23/22 2232  GLUCAP 122*    Discharge time spent: greater than 30 minutes.  Signed: Elmarie Shiley, MD [facility] 06/29/2022

## 2022-06-29 NOTE — Progress Notes (Addendum)
Explained discharge instructions to patient. Reviewed follow up appointment and next medication administration times. Also reviewed education. Patient verbalized having an understanding for instructions given. All belongings are in the patient's possession. Patient wasn't on the telemetry monitor nor did she have IV access at the time of discharge. No other needs verbalized. Transported downstairs for discharge.

## 2022-06-29 NOTE — Plan of Care (Signed)

## 2022-06-29 NOTE — Progress Notes (Signed)
Physical Therapy Treatment Patient Details Name: [name] MRN: [id number] DOB: April 29, 1956 Today's Date: 06/29/2022   History of Present Illness 66 y.o. female in to ED 06/24/22 after seizure at home. CT head was negative; EEG done showing moderate diffuse encephalopathy, nonspecific etiology and no seizure firings were identified; MRI brain showed bilateral encephalomalacia in occipital poles which is a risk for secondary epileptic seizures.  PMH significant of EtOH abuse, EtOH withdrawal seizures (3 admits thus far this year for same including 2 in the past 4 months), CKD 3b, compensated cirrhosis, Lt breast CA, CHF, HTN.    PT Comments    Patient reports walking to bathroom earlier today. Denies foot pain after pre-medicated for pain. Able to ambulate 40 ft with RW and supervision with fatigue being the limiting factor. Reports this is about as far as she normally walks at home and will get her from the car into the house. Patient excited to discharge home today.     Recommendations for follow up therapy are one component of a multi-disciplinary discharge planning process, led by the attending physician.  Recommendations may be updated based on patient status, additional functional criteria and insurance authorization.  Follow Up Recommendations  No PT follow up     Assistance Recommended at Discharge Intermittent Supervision/Assistance  Patient can return home with the following A little help with walking and/or transfers;Assistance with cooking/housework;Assist for transportation;Help with stairs or ramp for entrance   Equipment Recommendations  None recommended by PT    Recommendations for Other Services       Precautions / Restrictions Precautions Precautions: Other (comment) Precaution Comments: seizure precautions Restrictions Weight Bearing Restrictions: No     Mobility  Bed Mobility Overal bed mobility: Modified Independent             General bed mobility  comments: pt sleeps in recliner at home, however with HOB slightly elevated and use of rail able to come to sit modified independent    Transfers Overall transfer level: Modified independent Equipment used: Rolling walker (2 wheels)               General transfer comment: pt places one hand up on RW as comes to stand; no tipping of RW noted    Ambulation/Gait Ambulation/Gait assistance: Supervision Gait Distance (Feet): 40 Feet Assistive device: Rolling walker (2 wheels) Gait Pattern/deviations: Step-to pattern, Decreased stride length Gait velocity: decr     General Gait Details: denied pain in rt foot; reports 40 ft will get her from car, up ramp and into house   Secretary/administrator Wheelchair mobility:  (reports she could use w/c for car into home if needed)  Modified Rankin (Stroke Patients Only)       Balance Overall balance assessment: Modified Independent                                          Cognition Arousal/Alertness: Awake/alert Behavior During Therapy: WFL for tasks assessed/performed Overall Cognitive Status: Within Functional Limits for tasks assessed                                          Exercises      General Comments  Pertinent Vitals/Pain Pain Assessment Pain Assessment: No/denies pain (had pain medicine prior to session)    Home Living                          Prior Function            PT Goals (current goals can now be found in the care plan section) Acute Rehab PT Goals Patient Stated Goal: go home PT Goal Formulation: With patient Time For Goal Achievement: 07/12/22 Potential to Achieve Goals: Good Progress towards PT goals: Progressing toward goals    Frequency    Min 3X/week      PT Plan Current plan remains appropriate    Co-evaluation              AM-PAC PT "6 Clicks" Mobility   Outcome Measure   Help needed turning from your back to your side while in a flat bed without using bedrails?: A Little Help needed moving from lying on your back to sitting on the side of a flat bed without using bedrails?: A Little Help needed moving to and from a bed to a chair (including a wheelchair)?: A Little Help needed standing up from a chair using your arms (e.g., wheelchair or bedside chair)?: A Little Help needed to walk in hospital room?: A Little Help needed climbing 3-5 steps with a railing? : Total 6 Click Score: 16    End of Session   Activity Tolerance: Patient tolerated treatment well Patient left: in chair;with call bell/phone within reach;with chair alarm set   PT Visit Diagnosis: Other abnormalities of gait and mobility (R26.89);Pain Pain - Right/Left: Right Pain - part of body: Ankle and joints of foot     Time: 1243-1300 PT Time Calculation (min) (ACUTE ONLY): 17 min  Charges:  $Gait Training: 8-22 mins                      Emily  Office [phone]    Rexanne Mano 06/29/2022, 1:09 PM

## 2022-06-29 NOTE — TOC Transition Note (Addendum)
Transition of Care [facility]) - CM/SW Discharge Note   Patient Details  Name: [name] MRN: [id number] Date of Birth: 08/11/1956  Transition of Care [facility]) CM/SW Contact:  Pollie Friar, RN Phone Number: 06/29/2022, 1:19 PM   Clinical Narrative:    Pt is discharging home with self care. No f/u per PT/OT.  Pt has transportation home.  1635: received notification that she is active with [facility] through [facility]. Orders for resumption entered.   Final next level of care: Home/Self Care Barriers to Discharge: No Barriers Identified   Patient Goals and CMS Choice        Discharge Placement                       Discharge Plan and Services                                     Social Determinants of Health (SDOH) Interventions     Readmission Risk Interventions    09/07/2021   11:26 AM 03/06/2020   10:03 AM 02/13/2020   10:59 AM  Readmission Risk Prevention Plan  Transportation Screening Complete Complete Complete  PCP or Specialist Appt within 3-5 Days  Not Complete   Not Complete comments  Unsure of dc date at this time   [place] or [place]  Complete   Social Work Consult for [place] Planning/Counseling  Complete   Palliative Care Screening  Not Applicable   Medication Review Press photographer) Complete Complete Complete  PCP or Specialist appointment within 3-5 days of discharge Complete  Complete  HRI or Home Care Consult Complete  Complete  SW Recovery Care/Counseling Consult Complete  Complete  Palliative Care Screening Not Applicable  Complete  [place] Complete  Not Applicable

## 2022-06-30 LAB — VITAMIN B1: Vitamin B1 (Thiamine): 167.9 nmol/L (ref 66.5–200.0)

## 2022-07-17 ENCOUNTER — Inpatient Hospital Stay (HOSPITAL_COMMUNITY)
Admission: EM | Admit: 2022-07-17 | Discharge: 2022-08-04 | DRG: 987 | Disposition: A | Discharge status: Hospice | Payer: Medicare Other | Attending: Internal Medicine | Admitting: Internal Medicine

## 2022-07-17 ENCOUNTER — Other Ambulatory Visit: Payer: Self-pay

## 2022-07-17 ENCOUNTER — Emergency Department (HOSPITAL_COMMUNITY): Payer: Medicare Other

## 2022-07-17 DIAGNOSIS — Z66 Do not resuscitate: Secondary | ICD-10-CM | POA: Diagnosis present

## 2022-07-17 DIAGNOSIS — M1990 Unspecified osteoarthritis, unspecified site: Secondary | ICD-10-CM | POA: Diagnosis present

## 2022-07-17 DIAGNOSIS — N859 Noninflammatory disorder of uterus, unspecified: Secondary | ICD-10-CM

## 2022-07-17 DIAGNOSIS — R188 Other ascites: Secondary | ICD-10-CM | POA: Diagnosis present

## 2022-07-17 DIAGNOSIS — J9601 Acute respiratory failure with hypoxia: Secondary | ICD-10-CM | POA: Diagnosis present

## 2022-07-17 DIAGNOSIS — N200 Calculus of kidney: Secondary | ICD-10-CM | POA: Diagnosis present

## 2022-07-17 DIAGNOSIS — Z79899 Other long term (current) drug therapy: Secondary | ICD-10-CM

## 2022-07-17 DIAGNOSIS — F1021 Alcohol dependence, in remission: Secondary | ICD-10-CM | POA: Diagnosis present

## 2022-07-17 DIAGNOSIS — M79605 Pain in left leg: Secondary | ICD-10-CM | POA: Diagnosis not present

## 2022-07-17 DIAGNOSIS — J9602 Acute respiratory failure with hypercapnia: Secondary | ICD-10-CM | POA: Diagnosis present

## 2022-07-17 DIAGNOSIS — B961 Klebsiella pneumoniae [K. pneumoniae] as the cause of diseases classified elsewhere: Secondary | ICD-10-CM | POA: Diagnosis present

## 2022-07-17 DIAGNOSIS — T8384XA Pain from genitourinary prosthetic devices, implants and grafts, initial encounter: Secondary | ICD-10-CM | POA: Diagnosis not present

## 2022-07-17 DIAGNOSIS — N390 Urinary tract infection, site not specified: Secondary | ICD-10-CM

## 2022-07-17 DIAGNOSIS — I130 Hypertensive heart and chronic kidney disease with heart failure and stage 1 through stage 4 chronic kidney disease, or unspecified chronic kidney disease: Secondary | ICD-10-CM | POA: Diagnosis present

## 2022-07-17 DIAGNOSIS — D631 Anemia in chronic kidney disease: Secondary | ICD-10-CM | POA: Diagnosis present

## 2022-07-17 DIAGNOSIS — N179 Acute kidney failure, unspecified: Secondary | ICD-10-CM

## 2022-07-17 DIAGNOSIS — R946 Abnormal results of thyroid function studies: Secondary | ICD-10-CM | POA: Insufficient documentation

## 2022-07-17 DIAGNOSIS — E162 Hypoglycemia, unspecified: Secondary | ICD-10-CM

## 2022-07-17 DIAGNOSIS — E875 Hyperkalemia: Secondary | ICD-10-CM | POA: Diagnosis present

## 2022-07-17 DIAGNOSIS — Z853 Personal history of malignant neoplasm of breast: Secondary | ICD-10-CM

## 2022-07-17 DIAGNOSIS — R410 Disorientation, unspecified: Principal | ICD-10-CM

## 2022-07-17 DIAGNOSIS — Z888 Allergy status to other drugs, medicaments and biological substances status: Secondary | ICD-10-CM

## 2022-07-17 DIAGNOSIS — R1900 Intra-abdominal and pelvic swelling, mass and lump, unspecified site: Secondary | ICD-10-CM | POA: Diagnosis present

## 2022-07-17 DIAGNOSIS — Z823 Family history of stroke: Secondary | ICD-10-CM

## 2022-07-17 DIAGNOSIS — R34 Anuria and oliguria: Secondary | ICD-10-CM | POA: Diagnosis present

## 2022-07-17 DIAGNOSIS — G9341 Metabolic encephalopathy: Secondary | ICD-10-CM | POA: Diagnosis present

## 2022-07-17 DIAGNOSIS — E8729 Other acidosis: Secondary | ICD-10-CM | POA: Diagnosis present

## 2022-07-17 DIAGNOSIS — T426X5A Adverse effect of other antiepileptic and sedative-hypnotic drugs, initial encounter: Secondary | ICD-10-CM | POA: Diagnosis present

## 2022-07-17 DIAGNOSIS — I5032 Chronic diastolic (congestive) heart failure: Secondary | ICD-10-CM | POA: Diagnosis present

## 2022-07-17 DIAGNOSIS — I5033 Acute on chronic diastolic (congestive) heart failure: Secondary | ICD-10-CM | POA: Diagnosis not present

## 2022-07-17 DIAGNOSIS — E860 Dehydration: Secondary | ICD-10-CM | POA: Diagnosis present

## 2022-07-17 DIAGNOSIS — N170 Acute kidney failure with tubular necrosis: Secondary | ICD-10-CM | POA: Diagnosis not present

## 2022-07-17 DIAGNOSIS — I712 Thoracic aortic aneurysm, without rupture, unspecified: Secondary | ICD-10-CM | POA: Diagnosis present

## 2022-07-17 DIAGNOSIS — G9389 Other specified disorders of brain: Secondary | ICD-10-CM | POA: Diagnosis present

## 2022-07-17 DIAGNOSIS — N3000 Acute cystitis without hematuria: Secondary | ICD-10-CM

## 2022-07-17 DIAGNOSIS — M79604 Pain in right leg: Secondary | ICD-10-CM | POA: Insufficient documentation

## 2022-07-17 DIAGNOSIS — K7460 Unspecified cirrhosis of liver: Secondary | ICD-10-CM | POA: Diagnosis present

## 2022-07-17 DIAGNOSIS — Z87898 Personal history of other specified conditions: Secondary | ICD-10-CM

## 2022-07-17 DIAGNOSIS — R278 Other lack of coordination: Secondary | ICD-10-CM | POA: Diagnosis present

## 2022-07-17 DIAGNOSIS — I10 Essential (primary) hypertension: Secondary | ICD-10-CM | POA: Diagnosis present

## 2022-07-17 DIAGNOSIS — C579 Malignant neoplasm of female genital organ, unspecified: Secondary | ICD-10-CM | POA: Diagnosis present

## 2022-07-17 DIAGNOSIS — G928 Other toxic encephalopathy: Secondary | ICD-10-CM | POA: Diagnosis present

## 2022-07-17 DIAGNOSIS — Y846 Urinary catheterization as the cause of abnormal reaction of the patient, or of later complication, without mention of misadventure at the time of the procedure: Secondary | ICD-10-CM | POA: Diagnosis not present

## 2022-07-17 DIAGNOSIS — Z9012 Acquired absence of left breast and nipple: Secondary | ICD-10-CM

## 2022-07-17 DIAGNOSIS — G8929 Other chronic pain: Secondary | ICD-10-CM | POA: Diagnosis present

## 2022-07-17 DIAGNOSIS — R971 Elevated cancer antigen 125 [CA 125]: Secondary | ICD-10-CM | POA: Diagnosis present

## 2022-07-17 DIAGNOSIS — G40909 Epilepsy, unspecified, not intractable, without status epilepticus: Secondary | ICD-10-CM | POA: Diagnosis present

## 2022-07-17 DIAGNOSIS — T83031A Leakage of indwelling urethral catheter, initial encounter: Secondary | ICD-10-CM | POA: Diagnosis not present

## 2022-07-17 DIAGNOSIS — Z1152 Encounter for screening for COVID-19: Secondary | ICD-10-CM

## 2022-07-17 DIAGNOSIS — G4733 Obstructive sleep apnea (adult) (pediatric): Secondary | ICD-10-CM | POA: Diagnosis present

## 2022-07-17 DIAGNOSIS — Z1231 Encounter for screening mammogram for malignant neoplasm of breast: Secondary | ICD-10-CM

## 2022-07-17 DIAGNOSIS — Z6841 Body Mass Index (BMI) 40.0 and over, adult: Secondary | ICD-10-CM

## 2022-07-17 DIAGNOSIS — Z8041 Family history of malignant neoplasm of ovary: Secondary | ICD-10-CM

## 2022-07-17 DIAGNOSIS — N1832 Chronic kidney disease, stage 3b: Secondary | ICD-10-CM | POA: Diagnosis present

## 2022-07-17 DIAGNOSIS — I700 Atherosclerosis of aorta: Secondary | ICD-10-CM | POA: Diagnosis present

## 2022-07-17 DIAGNOSIS — D638 Anemia in other chronic diseases classified elsewhere: Secondary | ICD-10-CM | POA: Diagnosis present

## 2022-07-17 DIAGNOSIS — Z833 Family history of diabetes mellitus: Secondary | ICD-10-CM

## 2022-07-17 DIAGNOSIS — R52 Pain, unspecified: Secondary | ICD-10-CM | POA: Insufficient documentation

## 2022-07-17 DIAGNOSIS — Z515 Encounter for palliative care: Secondary | ICD-10-CM

## 2022-07-17 DIAGNOSIS — Z8249 Family history of ischemic heart disease and other diseases of the circulatory system: Secondary | ICD-10-CM

## 2022-07-17 DIAGNOSIS — I890 Lymphedema, not elsewhere classified: Secondary | ICD-10-CM

## 2022-07-17 LAB — TROPONIN I (HIGH SENSITIVITY): Troponin I (High Sensitivity): 4 ng/L (ref <18)

## 2022-07-17 LAB — BLOOD GAS, VENOUS
Acid-base deficit: 3.9 mmol/L — ABNORMAL HIGH (ref 0.0–2.0)
Bicarbonate: 25.2 mmol/L (ref 20.0–28.0)
O2 Saturation: 66.6 %
Patient temperature: 37
pCO2, Ven: 63 mmHg — ABNORMAL HIGH (ref 44–60)
pH, Ven: 7.21 — ABNORMAL LOW (ref 7.25–7.43)
pO2, Ven: 42 mmHg (ref 32–45)

## 2022-07-17 LAB — CBC WITH DIFFERENTIAL/PLATELET
Abs Immature Granulocytes: 0.03 10*3/uL (ref 0.00–0.07)
Basophils Absolute: 0 10*3/uL (ref 0.0–0.1)
Basophils Relative: 0 %
Eosinophils Absolute: 0.2 10*3/uL (ref 0.0–0.5)
Eosinophils Relative: 3 %
HCT: 33.5 % — ABNORMAL LOW (ref 36.0–46.0)
Hemoglobin: 10.2 g/dL — ABNORMAL LOW (ref 12.0–15.0)
Immature Granulocytes: 1 %
Lymphocytes Relative: 23 %
Lymphs Abs: 1.5 10*3/uL (ref 0.7–4.0)
MCH: 28.9 pg (ref 26.0–34.0)
MCHC: 30.4 g/dL (ref 30.0–36.0)
MCV: 94.9 fL (ref 80.0–100.0)
Monocytes Absolute: 0.6 10*3/uL (ref 0.1–1.0)
Monocytes Relative: 9 %
Neutro Abs: 4.3 10*3/uL (ref 1.7–7.7)
Neutrophils Relative %: 64 %
Platelets: 240 10*3/uL (ref 150–400)
RBC: 3.53 MIL/uL — ABNORMAL LOW (ref 3.87–5.11)
RDW: 14.3 % (ref 11.5–15.5)
WBC: 6.5 10*3/uL (ref 4.0–10.5)
nRBC: 0 % (ref 0.0–0.2)

## 2022-07-17 LAB — COMPREHENSIVE METABOLIC PANEL
ALT: 7 U/L (ref 0–44)
AST: 20 U/L (ref 15–41)
Albumin: 3.3 g/dL — ABNORMAL LOW (ref 3.5–5.0)
Alkaline Phosphatase: 40 U/L (ref 38–126)
Anion gap: 9 (ref 5–15)
BUN: 45 mg/dL — ABNORMAL HIGH (ref 8–23)
CO2: 25 mmol/L (ref 22–32)
Calcium: 8.9 mg/dL (ref 8.9–10.3)
Chloride: 102 mmol/L (ref 98–111)
Creatinine, Ser: 6.23 mg/dL — ABNORMAL HIGH (ref 0.44–1.00)
GFR, Estimated: 7 mL/min — ABNORMAL LOW (ref >60)
Glucose, Bld: 100 mg/dL — ABNORMAL HIGH (ref 70–99)
Potassium: 4.9 mmol/L (ref 3.5–5.1)
Sodium: 136 mmol/L (ref 135–145)
Total Bilirubin: 0.5 mg/dL (ref 0.3–1.2)
Total Protein: 8.2 g/dL — ABNORMAL HIGH (ref 6.5–8.1)

## 2022-07-17 LAB — I-STAT CHEM 8, ED
BUN: 42 mg/dL — ABNORMAL HIGH (ref 8–23)
Calcium, Ion: 1.19 mmol/L (ref 1.15–1.40)
Chloride: 103 mmol/L (ref 98–111)
Creatinine, Ser: 6.3 mg/dL — ABNORMAL HIGH (ref 0.44–1.00)
Glucose, Bld: 97 mg/dL (ref 70–99)
HCT: 32 % — ABNORMAL LOW (ref 36.0–46.0)
Hemoglobin: 10.9 g/dL — ABNORMAL LOW (ref 12.0–15.0)
Potassium: 4.9 mmol/L (ref 3.5–5.1)
Sodium: 136 mmol/L (ref 135–145)
TCO2: 26 mmol/L (ref 22–32)

## 2022-07-17 LAB — ETHANOL: Alcohol, Ethyl (B): <10 mg/dL (ref <10)

## 2022-07-17 LAB — CBG MONITORING, ED: Glucose-Capillary: 100 mg/dL — ABNORMAL HIGH (ref 70–99)

## 2022-07-17 NOTE — ED Provider Notes (Signed)
[facility] DEPT Provider Note   CSN: [id number] Arrival date & time: 07/17/22  2232     History {Add pertinent medical, surgical, social history, OB history to HPI:1} Chief Complaint  Patient presents with   Altered Mental Status    [name] is a 66 y.o. female.   Altered Mental Status  [name] is a 66 y.o. female who presents to the Emergency Department complaining of ***  Going to bathroom, fell because leg went out.  Got confused today.  For several days increased sleep.  No alcohol since June 9.      Home Medications Prior to Admission medications   Medication Sig Start Date End Date Taking? Authorizing Provider  albuterol (VENTOLIN HFA) 108 (90 Base) MCG/ACT inhaler Inhale 2 puffs into the lungs every 6 (six) hours as needed for wheezing or shortness of breath. 09/12/21 09/12/22  [name], Corrie Mckusick, MD  amLODipine (NORVASC) 10 MG tablet Take 1 tablet (10 mg total) by mouth daily. Patient taking differently: Take 10 mg by mouth every morning. 09/12/21   [name], [name], MD  BIOTIN PO Take 1 tablet by mouth daily.    [provider]  carvedilol (COREG) 12.5 MG tablet Take 12.5 mg by mouth 2 (two) times daily.    [provider]  folic acid (FOLVITE) 1 MG tablet Take 1 tablet (1 mg total) by mouth daily. 06/30/22   Regalado, Belkys A, MD  furosemide (LASIX) 20 MG tablet Take 1 tablet (20 mg total) by mouth daily. Please make overdue appt with Dr. Gasper Sells before anymore refills. Thank you 1st attempt Patient taking differently: Take 20 mg by mouth every morning. 06/03/21   Chandrasekhar, Lyda Kalata A, MD  gabapentin (NEURONTIN) 300 MG capsule Take 1 capsule (300 mg total) by mouth at bedtime. Patient taking differently: Take 300 mg by mouth 2 (two) times daily. 07/14/20   Magrinat, Virgie Dad, MD  levETIRAcetam (KEPPRA) 500 MG tablet Take 1 tablet (500 mg total) by mouth 2 (two) times daily. 06/29/22   Regalado, Belkys A, MD   Multiple Vitamin (MULTIVITAMIN WITH MINERALS) TABS tablet Take 1 tablet by mouth daily. 02/08/22   Pahwani, Michell Heinrich, MD  polyethylene glycol (MIRALAX / GLYCOLAX) 17 g packet Take 17 g by mouth daily. 06/30/22   Regalado, Belkys A, MD  traMADol (ULTRAM) 50 MG tablet Take 50 mg by mouth 3 (three) times daily as needed (pain).    [provider]  traZODone (DESYREL) 50 MG tablet Take 50 mg by mouth at bedtime. 01/18/22   [provider]      Allergies    Patient has no known allergies.    Review of Systems   Review of Systems  Physical Exam Updated Vital Signs BP 107/77   Pulse 81   Temp 98 F (36.7 C) (Oral)   Resp 15   Wt 129.7 kg   SpO2 100%   BMI 54.04 kg/m  Physical Exam  ED Results / Procedures / Treatments   Labs (all labs ordered are listed, but only abnormal results are displayed) Labs Reviewed - No data to display  EKG EKG Interpretation  Date/Time:  Saturday July 17 2022 22:57:19 EST Ventricular Rate:  83 PR Interval:  180 QRS Duration: 85 QT Interval:  367 QTC Calculation: 432 R Axis:   43 Text Interpretation: Sinus rhythm Anteroseptal infarct, age indeterminate Confirmed by Quintella Reichert [phone]) on 07/17/2022 11:07:17 PM  Radiology No results found.  Procedures Procedures  {Document cardiac monitor, telemetry assessment  procedure when appropriate:1}  Medications Ordered in ED Medications - No data to display  ED Course/ Medical Decision Making/ A&P                           Medical Decision Making  ***  {Document critical care time when appropriate:1} {Document review of labs and clinical decision tools ie heart score, Chads2Vasc2 etc:1}  {Document your independent review of radiology images, and any outside records:1} {Document your discussion with family members, caretakers, and with consultants:1} {Document social determinants of health affecting pt's care:1} {Document your decision making why or why not admission,  treatments were needed:1} Final Clinical Impression(s) / ED Diagnoses Final diagnoses:  None    Rx / DC Orders ED Discharge Orders     None

## 2022-07-17 NOTE — ED Triage Notes (Addendum)
Pt in from home via [facility] for AMS, called by granddaughter, whom she lives with. EMS states pt was hypoxic on their arrival - 80%'s on RA, placed on 4LNC en route. Pt had visits last month for seizures, r/t ETOH withdrawal. Pt reports no ETOH in 55mos. EMS states family recently stopped giving her ordered Gabapentin.  A&ox3 on arrival, lethargic. Placed on 2LNC, as sats initially 86% on RA. Pt reporting RLE pain and admits to recent falls

## 2022-07-18 ENCOUNTER — Emergency Department (HOSPITAL_COMMUNITY): Payer: Medicare Other

## 2022-07-18 ENCOUNTER — Inpatient Hospital Stay (HOSPITAL_COMMUNITY): Payer: Medicare Other

## 2022-07-18 ENCOUNTER — Encounter (HOSPITAL_COMMUNITY): Payer: Self-pay | Admitting: Internal Medicine

## 2022-07-18 DIAGNOSIS — J9602 Acute respiratory failure with hypercapnia: Secondary | ICD-10-CM

## 2022-07-18 DIAGNOSIS — Z6841 Body Mass Index (BMI) 40.0 and over, adult: Secondary | ICD-10-CM | POA: Diagnosis not present

## 2022-07-18 DIAGNOSIS — N170 Acute kidney failure with tubular necrosis: Secondary | ICD-10-CM | POA: Diagnosis present

## 2022-07-18 DIAGNOSIS — T8384XA Pain from genitourinary prosthetic devices, implants and grafts, initial encounter: Secondary | ICD-10-CM | POA: Diagnosis not present

## 2022-07-18 DIAGNOSIS — F1020 Alcohol dependence, uncomplicated: Secondary | ICD-10-CM | POA: Diagnosis not present

## 2022-07-18 DIAGNOSIS — I700 Atherosclerosis of aorta: Secondary | ICD-10-CM | POA: Diagnosis present

## 2022-07-18 DIAGNOSIS — G9341 Metabolic encephalopathy: Secondary | ICD-10-CM | POA: Diagnosis present

## 2022-07-18 DIAGNOSIS — I5033 Acute on chronic diastolic (congestive) heart failure: Secondary | ICD-10-CM | POA: Diagnosis not present

## 2022-07-18 DIAGNOSIS — Y846 Urinary catheterization as the cause of abnormal reaction of the patient, or of later complication, without mention of misadventure at the time of the procedure: Secondary | ICD-10-CM | POA: Diagnosis not present

## 2022-07-18 DIAGNOSIS — K7460 Unspecified cirrhosis of liver: Secondary | ICD-10-CM | POA: Diagnosis present

## 2022-07-18 DIAGNOSIS — N3000 Acute cystitis without hematuria: Secondary | ICD-10-CM

## 2022-07-18 DIAGNOSIS — R946 Abnormal results of thyroid function studies: Secondary | ICD-10-CM | POA: Diagnosis not present

## 2022-07-18 DIAGNOSIS — D638 Anemia in other chronic diseases classified elsewhere: Secondary | ICD-10-CM | POA: Diagnosis not present

## 2022-07-18 DIAGNOSIS — G40909 Epilepsy, unspecified, not intractable, without status epilepticus: Secondary | ICD-10-CM | POA: Diagnosis present

## 2022-07-18 DIAGNOSIS — N179 Acute kidney failure, unspecified: Secondary | ICD-10-CM | POA: Diagnosis not present

## 2022-07-18 DIAGNOSIS — Z1152 Encounter for screening for COVID-19: Secondary | ICD-10-CM | POA: Diagnosis not present

## 2022-07-18 DIAGNOSIS — E8729 Other acidosis: Secondary | ICD-10-CM | POA: Diagnosis present

## 2022-07-18 DIAGNOSIS — J9601 Acute respiratory failure with hypoxia: Secondary | ICD-10-CM | POA: Diagnosis present

## 2022-07-18 DIAGNOSIS — F1021 Alcohol dependence, in remission: Secondary | ICD-10-CM | POA: Diagnosis present

## 2022-07-18 DIAGNOSIS — Z66 Do not resuscitate: Secondary | ICD-10-CM | POA: Diagnosis present

## 2022-07-18 DIAGNOSIS — I5031 Acute diastolic (congestive) heart failure: Secondary | ICD-10-CM | POA: Diagnosis not present

## 2022-07-18 DIAGNOSIS — I5032 Chronic diastolic (congestive) heart failure: Secondary | ICD-10-CM

## 2022-07-18 DIAGNOSIS — N1831 Chronic kidney disease, stage 3a: Secondary | ICD-10-CM | POA: Diagnosis not present

## 2022-07-18 DIAGNOSIS — C579 Malignant neoplasm of female genital organ, unspecified: Secondary | ICD-10-CM | POA: Diagnosis present

## 2022-07-18 DIAGNOSIS — R1900 Intra-abdominal and pelvic swelling, mass and lump, unspecified site: Secondary | ICD-10-CM | POA: Diagnosis not present

## 2022-07-18 DIAGNOSIS — N895 Stricture and atresia of vagina: Secondary | ICD-10-CM | POA: Diagnosis not present

## 2022-07-18 DIAGNOSIS — R188 Other ascites: Secondary | ICD-10-CM | POA: Diagnosis present

## 2022-07-18 DIAGNOSIS — I509 Heart failure, unspecified: Secondary | ICD-10-CM | POA: Diagnosis not present

## 2022-07-18 DIAGNOSIS — M79669 Pain in unspecified lower leg: Secondary | ICD-10-CM | POA: Diagnosis not present

## 2022-07-18 DIAGNOSIS — R87619 Unspecified abnormal cytological findings in specimens from cervix uteri: Secondary | ICD-10-CM | POA: Diagnosis not present

## 2022-07-18 DIAGNOSIS — N1832 Chronic kidney disease, stage 3b: Secondary | ICD-10-CM | POA: Diagnosis present

## 2022-07-18 DIAGNOSIS — N859 Noninflammatory disorder of uterus, unspecified: Secondary | ICD-10-CM | POA: Diagnosis not present

## 2022-07-18 DIAGNOSIS — D631 Anemia in chronic kidney disease: Secondary | ICD-10-CM | POA: Diagnosis present

## 2022-07-18 DIAGNOSIS — N189 Chronic kidney disease, unspecified: Secondary | ICD-10-CM | POA: Diagnosis not present

## 2022-07-18 DIAGNOSIS — Z87898 Personal history of other specified conditions: Secondary | ICD-10-CM

## 2022-07-18 DIAGNOSIS — N390 Urinary tract infection, site not specified: Secondary | ICD-10-CM | POA: Diagnosis not present

## 2022-07-18 DIAGNOSIS — R4182 Altered mental status, unspecified: Secondary | ICD-10-CM | POA: Diagnosis not present

## 2022-07-18 DIAGNOSIS — I130 Hypertensive heart and chronic kidney disease with heart failure and stage 1 through stage 4 chronic kidney disease, or unspecified chronic kidney disease: Secondary | ICD-10-CM | POA: Diagnosis present

## 2022-07-18 DIAGNOSIS — I10 Essential (primary) hypertension: Secondary | ICD-10-CM

## 2022-07-18 DIAGNOSIS — Z515 Encounter for palliative care: Secondary | ICD-10-CM | POA: Diagnosis not present

## 2022-07-18 DIAGNOSIS — R34 Anuria and oliguria: Secondary | ICD-10-CM | POA: Diagnosis present

## 2022-07-18 DIAGNOSIS — R410 Disorientation, unspecified: Secondary | ICD-10-CM | POA: Diagnosis present

## 2022-07-18 DIAGNOSIS — G928 Other toxic encephalopathy: Secondary | ICD-10-CM | POA: Diagnosis present

## 2022-07-18 LAB — CBC WITH DIFFERENTIAL/PLATELET
Abs Immature Granulocytes: 0.05 10*3/uL (ref 0.00–0.07)
Basophils Absolute: 0 10*3/uL (ref 0.0–0.1)
Basophils Relative: 0 %
Eosinophils Absolute: 0.1 10*3/uL (ref 0.0–0.5)
Eosinophils Relative: 2 %
HCT: 30.7 % — ABNORMAL LOW (ref 36.0–46.0)
Hemoglobin: 9.3 g/dL — ABNORMAL LOW (ref 12.0–15.0)
Immature Granulocytes: 1 %
Lymphocytes Relative: 15 %
Lymphs Abs: 1.3 10*3/uL (ref 0.7–4.0)
MCH: 28.9 pg (ref 26.0–34.0)
MCHC: 30.3 g/dL (ref 30.0–36.0)
MCV: 95.3 fL (ref 80.0–100.0)
Monocytes Absolute: 0.8 10*3/uL (ref 0.1–1.0)
Monocytes Relative: 9 %
Neutro Abs: 6.3 10*3/uL (ref 1.7–7.7)
Neutrophils Relative %: 73 %
Platelets: 227 10*3/uL (ref 150–400)
RBC: 3.22 MIL/uL — ABNORMAL LOW (ref 3.87–5.11)
RDW: 14.2 % (ref 11.5–15.5)
WBC: 8.6 10*3/uL (ref 4.0–10.5)
nRBC: 0 % (ref 0.0–0.2)

## 2022-07-18 LAB — SODIUM, URINE, RANDOM: Sodium, Ur: 27 mmol/L

## 2022-07-18 LAB — BLOOD GAS, VENOUS
Acid-base deficit: 4.9 mmol/L — ABNORMAL HIGH (ref 0.0–2.0)
Bicarbonate: 24.4 mmol/L (ref 20.0–28.0)
O2 Saturation: 77.1 %
Patient temperature: 37
pCO2, Ven: 64 mmHg — ABNORMAL HIGH (ref 44–60)
pH, Ven: 7.19 — CL (ref 7.25–7.43)
pO2, Ven: 49 mmHg — ABNORMAL HIGH (ref 32–45)

## 2022-07-18 LAB — RAPID URINE DRUG SCREEN, HOSP PERFORMED
Amphetamines: NOT DETECTED
Amphetamines: NOT DETECTED
Barbiturates: NOT DETECTED
Barbiturates: NOT DETECTED
Benzodiazepines: NOT DETECTED
Benzodiazepines: NOT DETECTED
Cocaine: NOT DETECTED
Cocaine: NOT DETECTED
Opiates: NOT DETECTED
Opiates: NOT DETECTED
Tetrahydrocannabinol: POSITIVE — AB
Tetrahydrocannabinol: POSITIVE — AB

## 2022-07-18 LAB — COMPREHENSIVE METABOLIC PANEL
ALT: 7 U/L (ref 0–44)
AST: 17 U/L (ref 15–41)
Albumin: 3.1 g/dL — ABNORMAL LOW (ref 3.5–5.0)
Alkaline Phosphatase: 38 U/L (ref 38–126)
Anion gap: 11 (ref 5–15)
BUN: 45 mg/dL — ABNORMAL HIGH (ref 8–23)
CO2: 22 mmol/L (ref 22–32)
Calcium: 8.8 mg/dL — ABNORMAL LOW (ref 8.9–10.3)
Chloride: 104 mmol/L (ref 98–111)
Creatinine, Ser: 6.02 mg/dL — ABNORMAL HIGH (ref 0.44–1.00)
GFR, Estimated: 7 mL/min — ABNORMAL LOW (ref >60)
Glucose, Bld: 101 mg/dL — ABNORMAL HIGH (ref 70–99)
Potassium: 5.2 mmol/L — ABNORMAL HIGH (ref 3.5–5.1)
Sodium: 137 mmol/L (ref 135–145)
Total Bilirubin: 0.3 mg/dL (ref 0.3–1.2)
Total Protein: 7.5 g/dL (ref 6.5–8.1)

## 2022-07-18 LAB — PROTIME-INR
INR: 1.2 (ref 0.8–1.2)
Prothrombin Time: 14.9 seconds (ref 11.4–15.2)

## 2022-07-18 LAB — BLOOD GAS, ARTERIAL
Acid-base deficit: 4.6 mmol/L — ABNORMAL HIGH (ref 0.0–2.0)
Bicarbonate: 23.8 mmol/L (ref 20.0–28.0)
Drawn by: 25770
O2 Saturation: 98.1 %
Patient temperature: 37
pCO2 arterial: 61 mmHg — ABNORMAL HIGH (ref 32–48)
pH, Arterial: 7.2 — ABNORMAL LOW (ref 7.35–7.45)
pO2, Arterial: 105 mmHg (ref 83–108)

## 2022-07-18 LAB — URINALYSIS, ROUTINE W REFLEX MICROSCOPIC
Bilirubin Urine: NEGATIVE
Glucose, UA: NEGATIVE mg/dL
Hgb urine dipstick: NEGATIVE
Ketones, ur: NEGATIVE mg/dL
Nitrite: NEGATIVE
Protein, ur: 30 mg/dL — AB
Specific Gravity, Urine: 1.016 (ref 1.005–1.030)
WBC, UA: >50 WBC/hpf — ABNORMAL HIGH (ref 0–5)
pH: 5 (ref 5.0–8.0)

## 2022-07-18 LAB — RESP PANEL BY RT-PCR (FLU A&B, COVID) ARPGX2
Influenza A by PCR: NEGATIVE
Influenza B by PCR: NEGATIVE
SARS Coronavirus 2 by RT PCR: NEGATIVE

## 2022-07-18 LAB — TROPONIN I (HIGH SENSITIVITY): Troponin I (High Sensitivity): 5 ng/L (ref <18)

## 2022-07-18 LAB — MAGNESIUM
Magnesium: 2 mg/dL (ref 1.7–2.4)
Magnesium: 2.1 mg/dL (ref 1.7–2.4)

## 2022-07-18 LAB — PROCALCITONIN: Procalcitonin: 0.76 ng/mL

## 2022-07-18 LAB — MRSA NEXT GEN BY PCR, NASAL: MRSA by PCR Next Gen: NOT DETECTED

## 2022-07-18 LAB — CREATININE, URINE, RANDOM: Creatinine, Urine: 354 mg/dL

## 2022-07-18 LAB — OSMOLALITY: Osmolality: 310 mOsm/kg — ABNORMAL HIGH (ref 275–295)

## 2022-07-18 LAB — T4, FREE: Free T4: 1.48 ng/dL — ABNORMAL HIGH (ref 0.61–1.12)

## 2022-07-18 LAB — TSH: TSH: 7.279 u[IU]/mL — ABNORMAL HIGH (ref 0.350–4.500)

## 2022-07-18 LAB — LACTIC ACID, PLASMA: Lactic Acid, Venous: 1.2 mmol/L (ref 0.5–1.9)

## 2022-07-18 LAB — CK: Total CK: 180 U/L (ref 38–234)

## 2022-07-18 LAB — PHOSPHORUS: Phosphorus: 6 mg/dL — ABNORMAL HIGH (ref 2.5–4.6)

## 2022-07-18 LAB — CBG MONITORING, ED: Glucose-Capillary: 98 mg/dL (ref 70–99)

## 2022-07-18 LAB — AMMONIA: Ammonia: 22 umol/L (ref 9–35)

## 2022-07-18 LAB — BRAIN NATRIURETIC PEPTIDE: B Natriuretic Peptide: 63.1 pg/mL (ref 0.0–100.0)

## 2022-07-18 MED ORDER — SODIUM CHLORIDE 0.9 % IV BOLUS
1000.0000 mL | Freq: Once | INTRAVENOUS | Status: AC
  Administered 2022-07-18: 1000 mL via INTRAVENOUS

## 2022-07-18 MED ORDER — SODIUM CHLORIDE 0.9 % IV SOLN
2.0000 g | Freq: Once | INTRAVENOUS | Status: AC
  Administered 2022-07-18: 2 g via INTRAVENOUS
  Filled 2022-07-18: qty 20

## 2022-07-18 MED ORDER — ACETAMINOPHEN 650 MG RE SUPP: 650.0000 mg | Freq: Four times a day (QID) | RECTAL | Status: DC | PRN

## 2022-07-18 MED ORDER — NALOXONE HCL 0.4 MG/ML IJ SOLN: 0.4000 mg | INTRAMUSCULAR | Status: DC | PRN

## 2022-07-18 MED ORDER — LACTATED RINGERS IV SOLN: INTRAVENOUS | Status: DC

## 2022-07-18 MED ORDER — LEVETIRACETAM IN NACL 500 MG/100ML IV SOLN
500.0000 mg | Freq: Two times a day (BID) | INTRAVENOUS | Status: DC
  Administered 2022-07-18 – 2022-07-21 (×7): 500 mg via INTRAVENOUS
  Filled 2022-07-18 (×8): qty 100

## 2022-07-18 MED ORDER — CHLORHEXIDINE GLUCONATE CLOTH 2 % EX PADS
6.0000 | MEDICATED_PAD | Freq: Every day | CUTANEOUS | Status: DC
  Administered 2022-07-18: 6 via TOPICAL

## 2022-07-18 MED ORDER — ORAL CARE MOUTH RINSE
15.0000 mL | OROMUCOSAL | Status: DC
  Administered 2022-07-18 – 2022-08-04 (×62): 15 mL via OROMUCOSAL

## 2022-07-18 MED ORDER — ORAL CARE MOUTH RINSE: 15.0000 mL | OROMUCOSAL | Status: DC | PRN

## 2022-07-18 MED ORDER — SODIUM CHLORIDE 0.9 % IV SOLN: INTRAVENOUS | Status: DC

## 2022-07-18 MED ORDER — CHLORHEXIDINE GLUCONATE CLOTH 2 % EX PADS
6.0000 | MEDICATED_PAD | Freq: Every day | CUTANEOUS | Status: DC
  Administered 2022-07-18 – 2022-08-03 (×16): 6 via TOPICAL

## 2022-07-18 MED ORDER — SODIUM CHLORIDE 0.9 % IV BOLUS
500.0000 mL | Freq: Once | INTRAVENOUS | Status: AC
  Administered 2022-07-18: 500 mL via INTRAVENOUS

## 2022-07-18 MED ORDER — SODIUM CHLORIDE 0.9 % IV SOLN
1.0000 g | INTRAVENOUS | Status: DC
  Administered 2022-07-18: 1 g via INTRAVENOUS
  Filled 2022-07-18: qty 10

## 2022-07-18 MED ORDER — ACETAMINOPHEN 325 MG PO TABS
650.0000 mg | ORAL_TABLET | Freq: Four times a day (QID) | ORAL | Status: DC | PRN
  Administered 2022-07-19 – 2022-08-02 (×14): 650 mg via ORAL
  Filled 2022-07-18 (×17): qty 2

## 2022-07-18 MED ORDER — ALBUTEROL SULFATE (2.5 MG/3ML) 0.083% IN NEBU: 2.5000 mg | INHALATION_SOLUTION | RESPIRATORY_TRACT | Status: DC | PRN

## 2022-07-18 NOTE — Progress Notes (Signed)
RT arrived to PT room. Per ordering MD, PT does not need BiPAP at this time but would like to modify existing order to QHS & PRN (suspects possible OSA). PT remains A&O and is not in respiratory distress at this time.

## 2022-07-18 NOTE — Progress Notes (Signed)
AT 1233 entered PT room to place on BiPAP (PT remains A/O). PT refused BiPAP at this time- states she is expecting phone call (phone did ring). RN aware and will call RT for application.

## 2022-07-18 NOTE — Hospital Course (Addendum)
[name] is a 66 y.o. female with a history of breast cancer in remission, alcohol dependence, alcohol withdrawal/non-alcohol related seizures, CKD stage IIIb. Patient presented secondary to progressive confusion and found to have AKI likely secondary to ATN. Symptoms slowly improved with IV fluids. During admission, patient was also found to have evidence of an incidental pelvic mass; workup started.

## 2022-07-18 NOTE — Plan of Care (Signed)
Discussed with patient plan of care for the shift, pain management and admission questions with some teach back displayed.  Problem: Education: Goal: Knowledge of General Education information will improve Description: Including pain rating scale, medication(s)/side effects and non-pharmacologic comfort measures Outcome: Progressing   Problem: Health Behavior/Discharge Planning: Goal: Ability to manage health-related needs will improve Outcome: Progressing

## 2022-07-18 NOTE — Progress Notes (Signed)
BiPAP was set up in PT room as of 1035. PT is leaving for MRI (PT cannot be on BiPAP in MRI - aspiration risk with mask on face and PT laying flat). BiPAP has been ordered PRN- PT did not appear to be in respiratory distress and was responsive prior to departure. RN aware BiPAP has been ordered and agrees to contact RT when PT returns.

## 2022-07-18 NOTE — H&P (Signed)
History and Physical      [name] YIR:[id number] DOB: 1956/03/24 DOA: 07/17/2022  PCP: [name], MD  Patient coming from: home   I have personally briefly reviewed patient's old medical records in [place]  Chief Complaint: Altered mental status  HPI: [name] is a 66 y.o. female with medical history significant for chronic alcohol abuse, stage IIIb CKD associated baseline creatinine range 1.5 recently diagnosed with seizures left-sided breast cancer diagnosed in April 2021 status postmastectomy with axillary lymph chronic diastolic heart failure, essential hypertension, chronic disease associated baseline hemoglobin 9-11 who is admitted to [facility] on 07/17/2022 with acute kidney injury superimposed on CKD 3B after presenting from home to [facility] ED for evaluation of altered mental status.  In the setting of the patient's altered mental status, the following history is provided over the phone by the patient's granddaughter, in addition to my discussions with the EDP and via chart review.  The patient was recently hospitalized in the [facility] health system at the end of October 2023, at which time she received new diagnosis of seizures, started on Keppra.  Over the last 2 to 3 days, patient's granddaughter conveys that the patient has become progressively more confused, somnolent and in the setting of experienced a fall without associated loss of consciousness on 1723 following which she has been complaining of some right-sided leg discomfort.  Reportedly no associated loss of consciousness.  In the context of further progression of the patient's confusion, EMS was contacted, and noted the patient's initial O2 sat to be in the low 80s on room air, with subsequent improvement into the high 90s on 2 to 3 L nasal cannula.  This is in the context of no known baseline supplemental oxygen requirements.  Granddaughter also conveys that the patient has exhibited significant  reduction in oral intake over the last 2 to 3 days.  Medical history notable for CKD 3B a/w baseline creatinine range of 1.5-1.8, with most recent prior serum creatinine data point noted to be 1.53 on 06/28/2022.       ED Course:  Vital signs in the ED were notable for the following: Afebrile; heart rate 85; systolic blood pressures in the 90s to low 100s; respiratory rate 15-18, oxygen saturation, as noted above, initially 90s on 2 to 3 L nasal cannula.  Labs were notable for the following: VBG notable for 7.21/63.  4.9, bicarbonate 25, anion gap creatinine 6.237.2.  Liver enzymes within normal limits.  Serum ethanol level less than 10.  High-sensitivity troponin I found recent prior value of 194 in January 2023.  Initiation of antibiotics, while COVID-19/Avinza PCR were negative.  Lactic acid 1.2.  CBC notable for cell count 6500 with 64% neutrophils, hemoglobin 10.26 with normocytic/normochromic findings as well as nonelevated RDW, and relative features of prior hemoglobin urinalysis associated with amber appearing cloudy specimen with findings notable for greater than 50 white blood cells, many bacteria, large leukocyte esterase, no squamous epithelial cells also showing 30 protein, no RBCs, and was positive for hyaline cast.  Per my interpretation, EKG in ED demonstrated the following: EKG shows sinus rhythm with heart rate 83, normal intervals, and no evidence of T wave or ST changes, including no evidence of ST elevation.  Symptoms of the left hip showed no evidence of acute fracture.  Plain films of the right knee showed no evidence of acute fracture.  Vitals of the right ankle show no evidence of acute fracture.  Chest x-ray shows cardiomegaly, in  the absence of any evidence of acute cardiopulmonary process complete evidence of infiltrate, edema, effusion, or pneumothorax.  CT head shows no evidence of acute intracranial process, including no evidence of intracranial hemorrhage or any evidence  of acute infarct.  CT cervical spine showed no evidence of acute cervical spine fracture or subluxation injury.  CT abdomen/pelvis without contrast, per radiology read, shows a moderate complex pelvic fluid collection with hemorrhage related to trauma felt to be less likely.  Acute intra-abdominal or acute intrapelvic process.   While in the ED, the following were administered: Rocephin, 1 L normal saline bolus.  Subsequently, the patient was admitted acute kidney injury superimposed on CKD 3B, with presentation also notable for acute metabolic encephalopathy, acute cystitis, acute hypoxic hypercapnic respiratory failure, as well as potential new diagnosis of pelvic mass.     Review of Systems: As per HPI otherwise 10 point review of systems negative.   Past Medical History:  Diagnosis Date   Alcohol dependence ([place])    Aortic atherosclerosis ([place])    Arthritis    Cancer ([place]) 11/2019   Left breast   Chronic heart failure (HCC)    Chronic kidney disease, stage 3 ([place])    COVID-19    Diverticulitis    Family history of ovarian cancer    Heart murmur    11/26/19 echo: Mild AS. AV mean gradient 12.0 mmHg; however, LVOT gradient 8 mmHg with intracvitary gradient-significant AS is not suspected   Hypertension    Right nephrolithiasis    Thoracic aortic aneurysm (HCC)    Tubular adenoma polyp of rectum     Past Surgical History:  Procedure Laterality Date   COLONOSCOPY WITH PROPOFOL N/A 02/24/2021   Procedure: COLONOSCOPY WITH PROPOFOL;  Surgeon: Yetta Flock, MD;  Location: [facility];  Service: Gastroenterology;  Laterality: N/A;   MASTECTOMY WITH AXILLARY LYMPH NODE DISSECTION Left 12/19/2019   Procedure: LEFT MASTECTOMY WITH AXILLARY LYMPH NODE DISSECTION;  Surgeon: [name], MD;  Location: [place];  Service: General;  Laterality: Left;   MULTIPLE EXTRACTIONS WITH ALVEOLOPLASTY Bilateral 12/14/2019   Procedure: MULTIPLE EXTRACTION WITH ALVEOLOPLASTY;  Surgeon: [name], DDS;  Location: [place];  Service: Oral Surgery;  Laterality: Bilateral;   POLYPECTOMY  02/24/2021   Procedure: POLYPECTOMY;  Surgeon: Yetta Flock, MD;  Location: [facility];  Service: Gastroenterology;;   Sol Passer PLACEMENT N/A 12/19/2019   Procedure: INSERTION PORT-A-CATH WITH ULTRASOUND GUIDANCE;  Surgeon: [name], MD;  Location: [place];  Service: General;  Laterality: N/A;   RADIAL HEAD ARTHROPLASTY Right 06/25/2019   Procedure: RIGHT RADIAL HEAD ARTHROPLASTY;  Surgeon: [name], MD;  Location: [place];  Service: Orthopedics;  Laterality: Right;   TUBAL LIGATION      Social History:  reports that she has never smoked. She has never used smokeless tobacco. She reports current alcohol use of about 3.0 standard drinks of alcohol per week. She reports current drug use. Drug: Marijuana.   No Known Allergies  Family History  Problem Relation Age of Onset   Hypertension Mother    Dementia Mother    Ovarian cancer Half-Sister 32   Diabetes Half-Sister    Stroke Half-Sister     Family history reviewed and not pertinent    Prior to Admission medications   Medication Sig Start Date End Date Taking? Authorizing Provider  albuterol (VENTOLIN HFA) 108 (90 Base) MCG/ACT inhaler Inhale 2 puffs into the lungs every 6 (six) hours as needed for wheezing or shortness of breath.  09/12/21 09/12/22  [name], Corrie Mckusick, MD  amLODipine (NORVASC) 10 MG tablet Take 1 tablet (10 mg total) by mouth daily. Patient taking differently: Take 10 mg by mouth every morning. 09/12/21   [name], [name], MD  BIOTIN PO Take 1 tablet by mouth daily.    [provider]  carvedilol (COREG) 12.5 MG tablet Take 12.5 mg by mouth 2 (two) times daily.    [provider]  folic acid (FOLVITE) 1 MG tablet Take 1 tablet (1 mg total) by mouth daily. 06/30/22   Regalado, Belkys A, MD  furosemide (LASIX) 20 MG tablet Take 1 tablet (20 mg total) by mouth daily. Please make overdue appt with  Dr. Gasper Sells before anymore refills. Thank you 1st attempt Patient taking differently: Take 20 mg by mouth every morning. 06/03/21   Chandrasekhar, Lyda Kalata A, MD  gabapentin (NEURONTIN) 300 MG capsule Take 1 capsule (300 mg total) by mouth at bedtime. Patient taking differently: Take 300 mg by mouth 2 (two) times daily. 07/14/20   Magrinat, Virgie Dad, MD  levETIRAcetam (KEPPRA) 500 MG tablet Take 1 tablet (500 mg total) by mouth 2 (two) times daily. 06/29/22   Regalado, Belkys A, MD  Multiple Vitamin (MULTIVITAMIN WITH MINERALS) TABS tablet Take 1 tablet by mouth daily. 02/08/22   Pahwani, Michell Heinrich, MD  polyethylene glycol (MIRALAX / GLYCOLAX) 17 g packet Take 17 g by mouth daily. 06/30/22   Regalado, Belkys A, MD  traMADol (ULTRAM) 50 MG tablet Take 50 mg by mouth 3 (three) times daily as needed (pain).    [provider]  traZODone (DESYREL) 50 MG tablet Take 50 mg by mouth at bedtime. 01/18/22   [provider]     Objective    Physical Exam: Vitals:   07/18/22 0030 07/18/22 0100 07/18/22 0130 07/18/22 0200  BP: 102/74 1'02/74 98/70 96/65 '$  Pulse: 86 85 83 80  Resp: '18 18 18 18  '$ Temp:      TempSrc:      SpO2: 100% 99% 98% 96%  Weight:        General: appears to be stated age; somnolent, nonverball  Skin: warm, dry, no rash Head:  AT/[place] Mouth:  Oral mucosa membranes appear dry, normal dentition Neck: supple; trachea midline Heart:  RRR; did not appreciate any M/R/G Lungs: CTAB, did not appreciate any wheezes, rales, or rhonchi Abdomen: + BS; soft, ND, NT Vascular: 2+ pedal pulses b/l; 2+ radial pulses b/l Extremities: no peripheral edema, no muscle wasting Neuro: In the setting of the patient's current mental status and associated inability to follow instructions, unable to perform full neurologic exam at this time.  As such, assessment of strength, sensation, and cranial nerves is limited at this time. Patient noted to spontaneously move all 4 extremities. No  tremors.      Labs on Admission: I have personally reviewed following labs and imaging studies  CBC: Recent Labs  Lab 07/17/22 2322 07/17/22 2342  WBC 6.5  --   NEUTROABS 4.3  --   HGB 10.2* 10.9*  HCT 33.5* 32.0*  MCV 94.9  --   PLT 240  --    Basic Metabolic Panel: Recent Labs  Lab 07/17/22 2322 07/17/22 2342  NA 136 136  K 4.9 4.9  CL 102 103  CO2 25  --   GLUCOSE 100* 97  BUN 45* 42*  CREATININE 6.23* 6.30*  CALCIUM 8.9  --    GFR: Estimated Creatinine Clearance: 11.2 mL/min (A) (by C-G formula based on SCr of  6.3 mg/dL (H)). Liver Function Tests: Recent Labs  Lab 07/17/22 2322  AST 20  ALT 7  ALKPHOS 40  BILITOT 0.5  PROT 8.2*  ALBUMIN 3.3*   No results for input(s): "LIPASE", "AMYLASE" in the last 168 hours. No results for input(s): "AMMONIA" in the last 168 hours. Coagulation Profile: No results for input(s): "INR", "PROTIME" in the last 168 hours. Cardiac Enzymes: No results for input(s): "CKTOTAL", "CKMB", "CKMBINDEX", "TROPONINI" in the last 168 hours. BNP (last 3 results) No results for input(s): "PROBNP" in the last 8760 hours. HbA1C: No results for input(s): "HGBA1C" in the last 72 hours. CBG: Recent Labs  Lab 07/17/22 2332 07/18/22 0221  GLUCAP 100* 98   Lipid Profile: No results for input(s): "CHOL", "HDL", "LDLCALC", "TRIG", "CHOLHDL", "LDLDIRECT" in the last 72 hours. Thyroid Function Tests: No results for input(s): "TSH", "T4TOTAL", "FREET4", "T3FREE", "THYROIDAB" in the last 72 hours. Anemia Panel: No results for input(s): "VITAMINB12", "FOLATE", "FERRITIN", "TIBC", "IRON", "RETICCTPCT" in the last 72 hours. Urine analysis:    Component Value Date/Time   COLORURINE AMBER (A) 07/18/2022 0031   APPEARANCEUR CLOUDY (A) 07/18/2022 0031   LABSPEC 1.016 07/18/2022 0031   PHURINE 5.0 07/18/2022 0031   GLUCOSEU NEGATIVE 07/18/2022 0031   HGBUR NEGATIVE 07/18/2022 0031   BILIRUBINUR NEGATIVE 07/18/2022 0031   KETONESUR NEGATIVE  07/18/2022 0031   PROTEINUR 30 (A) 07/18/2022 0031   UROBILINOGEN 1.0 07/14/2012 1630   NITRITE NEGATIVE 07/18/2022 0031   LEUKOCYTESUR LARGE (A) 07/18/2022 0031    Radiological Exams on Admission: CT ABDOMEN PELVIS WO CONTRAST  Result Date: 07/18/2022 CLINICAL DATA:  Fall, abdominal pain, renal failure EXAM: CT ABDOMEN AND PELVIS WITHOUT CONTRAST TECHNIQUE: Multidetector CT imaging of the abdomen and pelvis was performed following the standard protocol without IV contrast. RADIATION DOSE REDUCTION: This exam was performed according to the departmental dose-optimization program which includes automated exposure control, adjustment of the mA and/or kV according to patient size and/or use of iterative reconstruction technique. COMPARISON:  CT pelvis dated 06/10/2021. CT abdomen/pelvis dated 03/07/2020. FINDINGS: Motion degraded images. Lower chest: Lung bases are clear. Hepatobiliary: Unenhanced liver is unremarkable. Gallbladder is unremarkable. No intrahepatic or extrahepatic duct dilatation. Pancreas: Within normal limits. Spleen: Within normal limits. Adrenals/Urinary Tract: Adrenal glands are within normal limits. Small parenchymal calcification in the posterior left lower kidney (series 4/image 31). Right kidney is within normal limits. No renal calculi or hydronephrosis. Bladder is within normal limits. Stomach/Bowel: Stomach is within normal limits. Bowel is partially obscured but grossly unremarkable. No evidence of bowel obstruction. Appendix is not discretely visualized. Vascular/Lymphatic: No evidence of abdominal aortic aneurysm. Atherosclerotic calcifications of the abdominal aorta and branch vessels. No suspicious abdominopelvic lymphadenopathy. Reproductive: Uterus and bilateral ovaries are obscured. There may be fluid within a dilated central uterine cavity (sagittal image 103), but this is poorly evaluated on the current study. Other: Mild perihepatic and perisplenic ascites, simple.  Moderate pelvic fluid/hemorrhage (series 4/image 58), masslike, with possible omental nodularity (series 4/image 49). Musculoskeletal: Degenerative changes of the visualized thoracolumbar spine. IMPRESSION: Motion degraded images. Moderate complex pelvic fluid/hemorrhage, as above. Hemorrhage related to trauma is possible but not favored. Rather, the appearance is worrisome for underlying (GYN) malignancy, although obscured on this noncontrast evaluation. Consider CT abdomen/pelvis with oral/IV contrast for further evaluation, if clinically able. Moderate Electronically Signed   By: Julian Hy M.D.   On: 07/18/2022 01:06   CT Head Wo Contrast  Result Date: 07/18/2022 CLINICAL DATA:  Memory loss, lethargy, recent falls. ETOH  withdrawal EXAM: CT HEAD WITHOUT CONTRAST CT CERVICAL SPINE WITHOUT CONTRAST TECHNIQUE: Multidetector CT imaging of the head and cervical spine was performed following the standard protocol without intravenous contrast. Multiplanar CT image reconstructions of the cervical spine were also generated. RADIATION DOSE REDUCTION: This exam was performed according to the departmental dose-optimization program which includes automated exposure control, adjustment of the mA and/or kV according to patient size and/or use of iterative reconstruction technique. COMPARISON:  MRI brain dated 06/27/2022. FINDINGS: CT HEAD FINDINGS Brain: No evidence of acute infarction, hemorrhage, hydrocephalus, extra-axial collection or mass lesion/mass effect. Bilateral PCA distribution infarcts. Mild subcortical white matter and periventricular small vessel ischemic changes. Vascular: Intracranial atherosclerosis. Skull: Normal. Negative for fracture or focal lesion. Sinuses/Orbits: The visualized paranasal sinuses are essentially clear. The mastoid air cells are unopacified. Other: None. CT CERVICAL SPINE FINDINGS Alignment: Straightening of the cervical spine, likely positional. Skull base and vertebrae: No  acute fracture. No primary bone lesion or focal pathologic process. Benign bone island at C7. Soft tissues and spinal canal: No prevertebral fluid or swelling. No visible canal hematoma. Disc levels:  Mild degenerative changes of the mid cervical spine. Upper chest: Visualized lung apices are clear. Other: Visualized thyroid is unremarkable. IMPRESSION: No evidence of acute intracranial abnormality. Bilateral PCA distribution infarcts. Mild small vessel ischemic changes. No traumatic injury to the cervical spine. Mild degenerative changes. Electronically Signed   By: Julian Hy M.D.   On: 07/18/2022 00:46   CT Cervical Spine Wo Contrast  Result Date: 07/18/2022 CLINICAL DATA:  Memory loss, lethargy, recent falls. ETOH withdrawal EXAM: CT HEAD WITHOUT CONTRAST CT CERVICAL SPINE WITHOUT CONTRAST TECHNIQUE: Multidetector CT imaging of the head and cervical spine was performed following the standard protocol without intravenous contrast. Multiplanar CT image reconstructions of the cervical spine were also generated. RADIATION DOSE REDUCTION: This exam was performed according to the departmental dose-optimization program which includes automated exposure control, adjustment of the mA and/or kV according to patient size and/or use of iterative reconstruction technique. COMPARISON:  MRI brain dated 06/27/2022. FINDINGS: CT HEAD FINDINGS Brain: No evidence of acute infarction, hemorrhage, hydrocephalus, extra-axial collection or mass lesion/mass effect. Bilateral PCA distribution infarcts. Mild subcortical white matter and periventricular small vessel ischemic changes. Vascular: Intracranial atherosclerosis. Skull: Normal. Negative for fracture or focal lesion. Sinuses/Orbits: The visualized paranasal sinuses are essentially clear. The mastoid air cells are unopacified. Other: None. CT CERVICAL SPINE FINDINGS Alignment: Straightening of the cervical spine, likely positional. Skull base and vertebrae: No acute  fracture. No primary bone lesion or focal pathologic process. Benign bone island at C7. Soft tissues and spinal canal: No prevertebral fluid or swelling. No visible canal hematoma. Disc levels:  Mild degenerative changes of the mid cervical spine. Upper chest: Visualized lung apices are clear. Other: Visualized thyroid is unremarkable. IMPRESSION: No evidence of acute intracranial abnormality. Bilateral PCA distribution infarcts. Mild small vessel ischemic changes. No traumatic injury to the cervical spine. Mild degenerative changes. Electronically Signed   By: Julian Hy M.D.   On: 07/18/2022 00:46   DG Knee Complete 4 Views Right  Result Date: 07/18/2022 CLINICAL DATA:  Altered mental status.  Pain. EXAM: RIGHT KNEE - COMPLETE 4+ VIEW COMPARISON:  None Available. FINDINGS: Examination is limited secondary to body habitus and technique. There is no definite acute fracture or dislocation. There is severe medial compartment joint space narrowing with osteophyte formation. There is mild patellofemoral joint space narrowing with osteophyte formation. Soft tissues are grossly within normal limits. IMPRESSION: 1. No  definite acute fracture or dislocation. 2. 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 Electronically Signed   By: Ronney Asters M.D.   On: 07/18/2022 00:12   DG Chest Port 1 View  Result Date: 07/18/2022 CLINICAL DATA:  Hypoxia EXAM: PORTABLE CHEST 1 VIEW COMPARISON:  06/23/2022 FINDINGS: Cardiomegaly. Lungs clear. No effusions or edema. No acute bony abnormality. Right chest wall Port-A-Cath remains in place with the tip at the cavoatrial junction, unchanged. IMPRESSION: Cardiomegaly.  No active disease. Electronically Signed   By: Rolm Baptise M.D.   On: 07/18/2022 00:11   DG Ankle Complete Right  Result Date: 07/18/2022 CLINICAL DATA:  Altered mental status, pain, recent falls EXAM: RIGHT ANKLE - COMPLETE 3+ VIEW COMPARISON:  None Available.  FINDINGS: No acute bony abnormality. Specifically, no fracture, subluxation, or dislocation. Soft tissues intact. IMPRESSION: No acute bony abnormality. Electronically Signed   By: Rolm Baptise M.D.   On: 07/18/2022 00:10   DG Hip Unilat W or Wo Pelvis 2-3 Views Right  Result Date: 07/18/2022 CLINICAL DATA:  Altered mental status.  Pain. EXAM: DG HIP (WITH OR WITHOUT PELVIS) 2-3V RIGHT COMPARISON:  None Available. FINDINGS: There is no evidence of hip fracture or dislocation. There is no evidence of arthropathy or other focal bone abnormality. IMPRESSION: Negative. Electronically Signed   By: Ronney Asters M.D.   On: 07/18/2022 00:08      Assessment/Plan    Principal Problem:   Acute renal failure superimposed on stage 3b chronic kidney disease (HCC) Active Problems:   Anemia of chronic disease   Essential hypertension   Chronic diastolic CHF (congestive heart failure) (HCC)   Acute metabolic encephalopathy   Acute respiratory failure with hypoxia and hypercapnia (HCC)   Acute cystitis   History of seizures         #) Acute Kidney Injury superimposed on CKD 3:  as quantified above.  With evidence of mild dehydration, as well as diminished renal perfusion as evidenced by acute hypoxic respiratory distress along with exacerbation of dehydration status via use of home Lasix in spite of recent decline in oral intake.  Also consider the possibility of postrenal obstructive contribution potentially images, contrast at this time, demonstrate no overt evidence of obstruction, including hydroureteronephrosis.  Presenting urinalysis with microscopy is hyaline casts, consistent with a picture of dehydration.  Note, presentation not associate with any hyperkalemia, nor any evidence of anion gap metabolic but at this time.  Does not appear to be emergent indication for hemodialysis at this time, will further eluate/trend renal function as further detailed blood pressure   Plan: monitor strict  I's & O's and daily weights. Attempt to avoid nephrotoxic agents.  Hold home gabapentin.  Hold Lasix for now.  Refrain from NSAIDs. Repeat CMP in the morning. Check serum magnesium level. Add-on random urine sodium and random urine creatinine.  Add on CPK level will 2 days ago.  Lactated Ringer's at 75 cc/h x 10 hours.  Acute hypoxic hypercapnic respiratory failure, as above.          #) Acute cystitis: Suspected diagnosis in the setting of the patient's altered mental status, with urinalysis with significant pyuria, many bacteria, large leukocyte esterase, no evidence of squamous) to suggest a contaminated specimen.  In the absence of further sepsis.  X2 and urine culture collected prior to initiation of Rocephin in the emergency department this evening.  We will continue Rocephin for now.  Consider additional underlying infectious will also add on procalcitonin level to  further evaluate will 19/influenza PCR negative.  Plan: IV fluids, as above.  Follow-up results of blood cultures x2 as well as urine culture.  Continue Rocephin.  CBC with differential in the morning.           #) Acute metabolic encephalopathy: Reported 2 to 3 days of progressive confusion associated with somnolence urinary tract infection as well as AKI superimposed on CKD 3, improving pharmacologic implications of this interval worsening of renal function, including circulation are present renally cleared.  Potential additional contribution in the setting of acute hypoxic hypercapnic respiratory failure, resulting in repeat demonstrates interval worsening.  Plan: fall precautions. Repeat CMP/CBC in the AM. Check magnesium level.  Repeat VBG, as above.  Check TSH n.p.o. for now until able to participate in swallow screen..  Tramadol, and gabapentin for CMP/CBC in the morning.  Further evaluation management of presenting acute cystitis, as above.             #) Acute hypoxic hypercapnic respiratory failure:  In the absence of any known baseline supplemental oxygen in the low 80s on room air, subsequent improving into the range of the high 90s on 2 to 3 L nasal cannula, with presenting VBG showing evidence of incompletely cavitated respiratory acidemia.  Suspect predisposition towards the latter from the standpoint of body habitus, increasing risk for obesity hypoventilation syndrome, laceration from to 3 days of worsening from independent contributing factors.  Of note, presenting chest x-ray shows no evidence of acute cardiopulmonary status including no evidence of acute volume overload, notable in setting of AKI on CKD 3B.   Plan: Repeat blood gas, as above.  Add on BNP, procalcitonin level.  Prn to have sedating medications, including tramadol, trazodone, and gabapentin.           #) Potential new diagnosis of pelvic mass: Complex fluid collection identified on today CT abdomen/pelvis.  Contrast study.  Pursuing CT scan with contrast should patient's renal function subsequently improved sufficiently to allow for this.  She has a prior personal history of malignancy, left-sided breast cancer which appears to have been diagnosed in April 2021, as further detailed above.  Of note, relevant preventative screenings include documentation of most recent colonoscopy, which occurred in June 2022, and was reportedly associated with polypectomy.   Plan: Consideration for pursuit of  CT imaging with contrast if renal function improves and is subsequently amenable, as above.                  #) History of seizures: Documented history of such, with new diagnosis of seizures occurring hospitalization at the end of October 2023, with initiation of Keppra at that time.  This evening's presentation is without overt clinical evidence to suggest active seizures at this time, but will continue to monitor for evidence of such..  As the patient is currently unable to take as result of her acute  encephalopathy, alcohol convert oral Keppra to IV route of administration for now.    Plan: Continue outpatient antiepileptic regimen, but with conversion to IV route for now, as above.  Add on serum Keppra level.           #) Chronic diastolic heart failure: documented history of such, with most recent echocardiogram performed in April 2022, which was notable for LVEF 6065%, no focal motion normalities, grade 2 diastolic side, normal right ventricular systolic function mild to moderate dilated left atrium, mildly dilated right atrium, trivial mitral regurgitation.. No clinical or radiographic evidence to suggest acutely  decompensated heart failure at this time. home diuretic regimen reportedly consists of the following: Lasix 20 mg p.o. daily, which we held for now in the setting of clinical evidence of intravascular depletion.    Plan: monitor strict I's & O's and daily weights. Repeat CMP in AM. Check serum mag level.  Hold home diuretic regimen for now, as above.          #) Anemia of chronic disease: Documented history of such, a/w with baseline hgb range 9-11, with presenting hgb consistent with this range, in the absence of any overt evidence of active bleed.     Plan: Repeat CBC in the morning.  Check INR.              #) Essential Hypertension: documented h/o such, with outpatient antihypertensive regimen including amlodipine, carvedilol, and Lasix.  No evidence of chest medication, will hold him Lasix for now.  Additionally, soft initial blood pressures in the setting of infection, will hold Norvasc and carvedilol for now as well.  SBP's in the ED today: 90s to low 100s mmHg.   Plan: Close monitoring of subsequent BP via routine VS. hold home antihypertensive medications for now, as above.          DVT prophylaxis: SCD's   Code Status: Full code Family Communication: Case was discussed with the patient's granddaughter, as further detailed  above Disposition Plan: Per Rounding Team Consults called: none;  Admission status: Inpatient     Christine DO [facility] [place]   07/18/2022, 2:36 AM

## 2022-07-18 NOTE — Consult Note (Signed)
Renal Service Consult Note [facility]  [name] 07/18/2022 Sol Blazing, MD Requesting Physician: Dr. Posey Pronto, Mamie Nick.   Reason for Consult: Renal failure HPI: The patient is a 66 y.o. year-old w/ PMH as below presented to ED last night w/ AMS taken by EMS. SpO2 was 80's on RA, 4L  added. Had visits for seizures last month, quit drinking sometime in the last 6 mos. Pt reported falls and R leg pain. In ED BP's were 90s- 100s initially. HR 80s, RR 15. Pt got bolus IVF"s. Family said she hadn't been eating good for several days. Labs showed creat 6.2 (b/l 1.5- 1.8). Asked to see for renal failure.   Pt seen in ICU bed. She is alert and Ox 3. Worried that she is "dying", not ready to die. Denies any active CP, abd pain, dysuria.  ROS - denies CP, no joint pain, no HA, no blurry vision, no rash, no dysuria, no difficulty voiding   Past Medical History  Past Medical History:  Diagnosis Date   Alcohol dependence ([place])    Aortic atherosclerosis ([place])    Arthritis    Cancer ([place]) 11/2019   Left breast   Chronic heart failure (HCC)    Chronic kidney disease, stage 3 ([place])    COVID-19    Diverticulitis    Family history of ovarian cancer    Heart murmur    11/26/19 echo: Mild AS. AV mean gradient 12.0 mmHg; however, LVOT gradient 8 mmHg with intracvitary gradient-significant AS is not suspected   Hypertension    Right nephrolithiasis    Thoracic aortic aneurysm (HCC)    Tubular adenoma polyp of rectum    Past Surgical History  Past Surgical History:  Procedure Laterality Date   COLONOSCOPY WITH PROPOFOL N/A 02/24/2021   Procedure: COLONOSCOPY WITH PROPOFOL;  Surgeon: Yetta Flock, MD;  Location: [facility];  Service: Gastroenterology;  Laterality: N/A;   MASTECTOMY WITH AXILLARY LYMPH NODE DISSECTION Left 12/19/2019   Procedure: LEFT MASTECTOMY WITH AXILLARY LYMPH NODE DISSECTION;  Surgeon: [name], MD;  Location: [facility];  Service: General;   Laterality: Left;   MULTIPLE EXTRACTIONS WITH ALVEOLOPLASTY Bilateral 12/14/2019   Procedure: MULTIPLE EXTRACTION WITH ALVEOLOPLASTY;  Surgeon: [name], DDS;  Location: [facility];  Service: Oral Surgery;  Laterality: Bilateral;   POLYPECTOMY  02/24/2021   Procedure: POLYPECTOMY;  Surgeon: Yetta Flock, MD;  Location: [facility];  Service: Gastroenterology;;   Sol Passer PLACEMENT N/A 12/19/2019   Procedure: INSERTION PORT-A-CATH WITH ULTRASOUND GUIDANCE;  Surgeon: [name], MD;  Location: [facility];  Service: General;  Laterality: N/A;   RADIAL HEAD ARTHROPLASTY Right 06/25/2019   Procedure: RIGHT RADIAL HEAD ARTHROPLASTY;  Surgeon: [name], MD;  Location: [place];  Service: Orthopedics;  Laterality: Right;   TUBAL LIGATION     Family History  Family History  Problem Relation Age of Onset   Hypertension Mother    Dementia Mother    Ovarian cancer Half-Sister 2   Diabetes Half-Sister    Stroke Half-Sister    Social History  reports that she has never smoked. She has never used smokeless tobacco. She reports current alcohol use of about 3.0 standard drinks of alcohol per week. She reports current drug use. Drug: Marijuana. Allergies No Known Allergies Home medications Prior to Admission medications   Medication Sig Start Date End Date Taking? Authorizing Provider  albuterol (VENTOLIN HFA) 108 (90 Base) MCG/ACT inhaler Inhale 2 puffs into the lungs every 6 (six) hours as  needed for wheezing or shortness of breath. 09/12/21 09/12/22  [name], Corrie Mckusick, MD  amLODipine (NORVASC) 10 MG tablet Take 1 tablet (10 mg total) by mouth daily. Patient taking differently: Take 10 mg by mouth every morning. 09/12/21   [name], [name], MD  BIOTIN PO Take 1 tablet by mouth daily.    [provider]  carvedilol (COREG) 12.5 MG tablet Take 12.5 mg by mouth 2 (two) times daily.    [provider]  folic acid (FOLVITE) 1 MG tablet Take 1 tablet (1 mg total) by mouth daily.  06/30/22   Regalado, Belkys A, MD  furosemide (LASIX) 20 MG tablet Take 1 tablet (20 mg total) by mouth daily. Please make overdue appt with Dr. Gasper Sells before anymore refills. Thank you 1st attempt Patient taking differently: Take 20 mg by mouth every morning. 06/03/21   Chandrasekhar, Lyda Kalata A, MD  gabapentin (NEURONTIN) 300 MG capsule Take 1 capsule (300 mg total) by mouth at bedtime. Patient taking differently: Take 300 mg by mouth 2 (two) times daily. 07/14/20   Magrinat, Virgie Dad, MD  levETIRAcetam (KEPPRA) 500 MG tablet Take 1 tablet (500 mg total) by mouth 2 (two) times daily. 06/29/22   Regalado, Belkys A, MD  Multiple Vitamin (MULTIVITAMIN WITH MINERALS) TABS tablet Take 1 tablet by mouth daily. 02/08/22   Pahwani, Michell Heinrich, MD  polyethylene glycol (MIRALAX / GLYCOLAX) 17 g packet Take 17 g by mouth daily. 06/30/22   Regalado, Belkys A, MD  traMADol (ULTRAM) 50 MG tablet Take 50 mg by mouth 3 (three) times daily as needed (pain).    [provider]  traZODone (DESYREL) 50 MG tablet Take 50 mg by mouth at bedtime. 01/18/22   [provider]     Vitals:   07/18/22 1614 07/18/22 1631 07/18/22 1700 07/18/22 1800  BP: 115/68 115/68 113/71 112/78  Pulse: 82 83 80 80  Resp: (!) _0 Temp: 97.9 F (36.6 C)     TempSrc: Oral     SpO2: 100% 100% 100% 100%  Weight:      Height:       Exam Gen alert, no distress, pleasant No rash, cyanosis or gangrene Sclera anicteric, throat clear  No jvd or bruits Chest clear bilat to bases, no rales/ wheezing sp L mastectomy, LUE edema 2+ chronic RRR no RG Abd soft marked obesity, ntnd no mass or ascites +bs GU defer MS no joint effusions or deformity Ext mild edema LE's, only around the ankles Neuro is alert, Ox 3 , nf, no asterixis   Home meds include - albuterol, amlodipine 10, coreg 12.5 bid, lasix 20 qam, gabapentin, keppra, MVI, ultram, trazodone, prns/ vits/ supps     Date  Creat  eGFR    2021  4.29 >>  1.31 AKI episode    11/11/20 1.95     Jan 2023 2.90 >> 1.17 17- 52 ml/min    June 2023 1.79 >> 1.52 31- 38 ml/min    Aug 2023 1.80 >> 1.65 34- 36 ml/min     Oct 2023 1.82 >> 1.53 30- 37 ml/min    11/18  6.23    07/18/22 6.02          B/l creatinine is 1.5- 1.8 from October 2023, eGFR 30- 37 ml/min   CT abd/ pelv no contrast -IMPRESSION: MPRESSION: Moderate complex pelvic fluid/hemorrhage, as above. Hemorrhage\ related to trauma is possible but not favored. Rather, the appearance is worrisome for underlying (GYN) malignancy, although obscured on  this noncontrast evaluation.    CT abd (adrenal / kidneys) - IMPRESSION: Adrenals/Urinary Tract: Adrenal glands are within normal limits. (Kidneys also look normal to undersigned's eyes).      UA 11/19 - cloudy, amber, large LE, prot 30, many bact, no rbc, >50 wbc   UNa 27,  UCr 354     CXR - normal chest, +cardiomegaly     BP's 90s- 100s on arrival, now up to 112/78 after IVF"s     HR 80s RR 13-20, afebrile     In ED got 2500 bolus fluids and then 75- 100 cc/hr     Na 137  K+ 5.2  CO2 22 BUN 45  cReat 6.02  Alb 3.1     LFT's ok, BNP 63   Hb 9.3  wBC 8K    Assessment/ Plan: AKI on CKD 3b - b/l creatinine 1.5- 1.8 from October 2023, eGFR 30- 37 ml/min. Creat here 6.23 on admission in setting of poor po intake at home, falls and leg pain. BP's were soft on arrival, better after 2-3 L IVF's. UA +wbc, no prot/ rbcs. CT abd shows normal kidneys w/o obstruction. Suspect AKI due to vol depletion/ hypotension/ hypoperfusion. Holding BP meds here. Will cont IVF's at 100 cc/hr. Place foley given sig azotemia and need for accurate records. No strong indication for RRT at this time, hopefully will start to recover function.  Hyperkalemia - mild, use renal diet please UTI/ cystitis - getting IV rocephin AMS - looks appropriate today AHHRF - w/ some hypercarbia, likely OHS as per H&P.  Pelvic mass - on CT H/o L breast cancer - sp mastectomy w/ chronic LUE  edema H/o seizures - continues home meds HTN - holding BP meds for now, BP's low Anemia - per pmd      Rob Jonnie Finner  MD 07/18/2022, 6:14 PM Recent Labs  Lab 07/17/22 2322 07/17/22 2342 07/18/22 0440  HGB 10.2* 10.9* 9.3*  ALBUMIN 3.3*  --  3.1*  CALCIUM 8.9  --  8.8*  PHOS  --   --  6.0*  CREATININE 6.23* 6.30* 6.02*  K 4.9 4.9 5.2*   Inpatient medications:  Chlorhexidine Gluconate Cloth  6 each Topical Daily   mouth rinse  15 mL Mouth Rinse 4 times per day    sodium chloride 100 mL/hr at 07/18/22 1715   cefTRIAXone (ROCEPHIN)  IV     levETIRAcetam Stopped (07/18/22 1502)   acetaminophen **OR** acetaminophen, albuterol, naLOXone (NARCAN)  injection, mouth rinse

## 2022-07-18 NOTE — Progress Notes (Signed)
Notified Lab that ABG being sent for analysis. 

## 2022-07-18 NOTE — Progress Notes (Signed)
[facility] Progress Note Patient: [name] PTW:[id number] DOB: 1955-11-01 DOA: 07/17/2022  DOS: the patient was seen and examined on 07/18/2022  Brief hospital course: PMH of CKD 3B, seizures, breast cancer, HTN, anemia presented to hospital with complaints of confusion and excessive sleepiness.  Found to have AKI on CKD 3B as well as acute hypercarbia with respiratory acidosis. Currently plan is to give IV hydration for AKI.  Nephrology consulted. BiPAP nightly. MRI brain negative.  EEG ordered.  Continues Keppra.  Hold gabapentin. Assessment and Plan: Acute metabolic encephalopathy. In the setting of AKI. Continue with IV hydration. Patient able to tolerate oral diet as per RN. BiPAP nightly and as needed. ABG did show some hypercarbia although suspect patient may have some chronic hypercarbia as well as chronic OSA. Potential toxic encephalopathy in the setting of gabapentin and Keppra use with AKI cannot be ruled out as well.  History of seizures. Continue Keppra. Patient is on gabapentin which I will hold.  AKI on CKD 3B. Most likely from poor p.o. intake. Currently we will continue with IV hydration. Oliguric therefore nephrology was consulted. Appreciate assistance.  Concern for UTI. Currently on IV antibiotic although low threshold to discontinue the antibiotic.  Pelvic mass/pelvic hemorrhage. H&H relatively stable. We will get MRI pelvis. We will get GYN consult. Most likely outpatient work-up and therapy.  Chronic diastolic CHF parameter monitor while receiving IV hydration.  Anemia of chronic disease. H&H relatively stable. Monitor.  HTN. Patient was recently given extra dose of losartan due to uncontrolled hypertension. Currently holding that medication in the setting of AKI.  Polypharmacy. Patient was actually receiving her medications from blister pack. Was also discharged from the hospital with additional medication. Concern is that the  patient was taking medications from the blister pack as well as from the discharge medication which most likely have resulted in her presentation with encephalopathy AKI. Monitor for now.   Subjective: No nausea no vomiting no fever no chills.  Was sleepy and unable to follow any commands earlier in the morning. Later in the day alert awake and oriented and able to follow all commands.  On my third visit is actually able to identify me as well.  Physical Exam: General: in mild distress;  Cardiovascular: S1 and S2 Present, no Murmur Respiratory: Good respiratory effort, Bilateral Air entry present, no Crackles, no wheezes Abdomen: Bowel Sound present, Non tender  Extremities: Left lower extremity edema, trace lower extremity edema Neurology: alert and oriented to time, place, and person no new focal deficit.  Data Reviewed: I have Reviewed nursing notes, Vitals, and Lab results. Since last encounter, pertinent lab results CBC and BMP   . I have ordered test including CBC and BMP and EEG  . I have discussed pt's care plan and test results with nephrology  . I have ordered imaging MRI brain, ultrasound pelvis MRI pelvis  .   Disposition: Status is: Inpatient Remains inpatient appropriate because: Need further work-up, still encephalopathy occasional myoclonic jerks seen.  SCDs Start: 07/18/22 0231   Family Communication: Son at bedside.  Discussed with granddaughter on the phone. Level of care: Stepdown  The patient is critically ill with multiple organ systems failure and requires high complexity decision making for assessment and support, frequent evaluation and titration of therapies. Critical Care Time devoted to patient care services described in this note is 35 minutes   Vitals:   07/18/22 1300 07/18/22 1400 07/18/22 1439 07/18/22 1500  BP: 106/74 106/78  124/84  Pulse: 82  84 84 82  Resp: '13 15 13 19  '$ Temp:      TempSrc:      SpO2: 93% 90% 97% 97%  Weight:      Height:          Author: Berle Mull, MD 07/18/2022 3:30 PM  Please look on [email or web address] to find out who is on call.

## 2022-07-19 ENCOUNTER — Inpatient Hospital Stay (HOSPITAL_COMMUNITY)
Admit: 2022-07-19 | Discharge: 2022-07-19 | Disposition: A | Discharge status: Home / Self Care | Payer: Medicare Other | Attending: Internal Medicine | Admitting: Internal Medicine

## 2022-07-19 ENCOUNTER — Inpatient Hospital Stay (HOSPITAL_COMMUNITY): Payer: Medicare Other

## 2022-07-19 DIAGNOSIS — F1020 Alcohol dependence, uncomplicated: Secondary | ICD-10-CM | POA: Diagnosis not present

## 2022-07-19 DIAGNOSIS — R1900 Intra-abdominal and pelvic swelling, mass and lump, unspecified site: Secondary | ICD-10-CM

## 2022-07-19 DIAGNOSIS — N859 Noninflammatory disorder of uterus, unspecified: Secondary | ICD-10-CM | POA: Diagnosis not present

## 2022-07-19 DIAGNOSIS — R4182 Altered mental status, unspecified: Secondary | ICD-10-CM | POA: Diagnosis not present

## 2022-07-19 DIAGNOSIS — N179 Acute kidney failure, unspecified: Secondary | ICD-10-CM | POA: Diagnosis not present

## 2022-07-19 DIAGNOSIS — R011 Cardiac murmur, unspecified: Secondary | ICD-10-CM

## 2022-07-19 DIAGNOSIS — N183 Chronic kidney disease, stage 3 unspecified: Secondary | ICD-10-CM

## 2022-07-19 DIAGNOSIS — I509 Heart failure, unspecified: Secondary | ICD-10-CM

## 2022-07-19 DIAGNOSIS — R87619 Unspecified abnormal cytological findings in specimens from cervix uteri: Secondary | ICD-10-CM | POA: Diagnosis not present

## 2022-07-19 DIAGNOSIS — N1832 Chronic kidney disease, stage 3b: Secondary | ICD-10-CM | POA: Diagnosis not present

## 2022-07-19 LAB — CBC WITH DIFFERENTIAL/PLATELET
Abs Immature Granulocytes: 0.07 10*3/uL (ref 0.00–0.07)
Basophils Absolute: 0 10*3/uL (ref 0.0–0.1)
Basophils Relative: 0 %
Eosinophils Absolute: 0.1 10*3/uL (ref 0.0–0.5)
Eosinophils Relative: 2 %
HCT: 29 % — ABNORMAL LOW (ref 36.0–46.0)
Hemoglobin: 8.7 g/dL — ABNORMAL LOW (ref 12.0–15.0)
Immature Granulocytes: 1 %
Lymphocytes Relative: 13 %
Lymphs Abs: 1.1 10*3/uL (ref 0.7–4.0)
MCH: 28.9 pg (ref 26.0–34.0)
MCHC: 30 g/dL (ref 30.0–36.0)
MCV: 96.3 fL (ref 80.0–100.0)
Monocytes Absolute: 0.7 10*3/uL (ref 0.1–1.0)
Monocytes Relative: 8 %
Neutro Abs: 6.5 10*3/uL (ref 1.7–7.7)
Neutrophils Relative %: 76 %
Platelets: 218 10*3/uL (ref 150–400)
RBC: 3.01 MIL/uL — ABNORMAL LOW (ref 3.87–5.11)
RDW: 14.2 % (ref 11.5–15.5)
WBC: 8.5 10*3/uL (ref 4.0–10.5)
nRBC: 0 % (ref 0.0–0.2)

## 2022-07-19 LAB — BASIC METABOLIC PANEL
Anion gap: 12 (ref 5–15)
BUN: 44 mg/dL — ABNORMAL HIGH (ref 8–23)
CO2: 19 mmol/L — ABNORMAL LOW (ref 22–32)
Calcium: 8.1 mg/dL — ABNORMAL LOW (ref 8.9–10.3)
Chloride: 108 mmol/L (ref 98–111)
Creatinine, Ser: 5.96 mg/dL — ABNORMAL HIGH (ref 0.44–1.00)
GFR, Estimated: 7 mL/min — ABNORMAL LOW (ref >60)
Glucose, Bld: 66 mg/dL — ABNORMAL LOW (ref 70–99)
Potassium: 5.2 mmol/L — ABNORMAL HIGH (ref 3.5–5.1)
Sodium: 139 mmol/L (ref 135–145)

## 2022-07-19 LAB — GLUCOSE, CAPILLARY
Glucose-Capillary: 114 mg/dL — ABNORMAL HIGH (ref 70–99)
Glucose-Capillary: 123 mg/dL — ABNORMAL HIGH (ref 70–99)

## 2022-07-19 LAB — LEVETIRACETAM LEVEL: Levetiracetam Lvl: 66.7 ug/mL — ABNORMAL HIGH (ref 10.0–40.0)

## 2022-07-19 LAB — MAGNESIUM: Magnesium: 2 mg/dL (ref 1.7–2.4)

## 2022-07-19 MED ORDER — SODIUM CHLORIDE 0.9 % IV SOLN
2.0000 g | INTRAVENOUS | Status: DC
  Administered 2022-07-19: 2 g via INTRAVENOUS
  Filled 2022-07-19: qty 20

## 2022-07-19 NOTE — Consult Note (Signed)
Gynecologic Oncology Consultation  [name] 66 y.o. female  CC:  Chief Complaint  Patient presents with   Altered Mental Status    HPI: [name] is a 66 year old female who presented to the [facility] ER via EMS on 07/17/2022 with altered mental status and hypoxia with sats in the 80s on RA when EMS arrived. Labs in the ER included TSH at 7.279, creatinine 6.23, albumin 3.3, Hgb 10.2, Hct 33.5, urinalysis with large amt of leukocytes/ many bacteria/ >50 WBC, blood cultures with no growth on preliminary, ammonia 22. Imaging studies were obtained including a negative hip xray, right knee and ankle xray, chest xray with cardiomegaly noted. On 07/18/2022, she underwent a CT head and cervical spine, which were negative for acute intracranial abnormalities along with a MRI of the brain with no acute findings. CT AP without contrast was also preformed on 07/18/22 revealing moderate complex pelvic fluid/hemorrhage with possible omental nodularity. To follow up on this, she underwent a pelvic ultrasound resulting suspected very large infiltrative pelvic soft tissue mass involving the right greater than left adnexal regions and encasing and obscuring the uterus, spanning up to 14.0 x 12.2 x 19.9 cm. MRI of the pelvis has been ordered and CA 125 tumor marker is pending.   Of note, she was recently admitted at the end of October 2023 for seizures and was started on keppra. Her past medical history includes Stage 3B chronic kidney disease, anemia of chronic disease, HBV infection, breast cancer, liver cirrhosis, HTN, alcohol use, alcohol withdraw seizures. She was last seen in May 2023 by Wilber Bihari NP for breast cancer follow up with her history including being status post left breast upper outer quadrant and left axillary lymph node biopsies 11/12/2019 for a clinical T2 N2, stage IIIA invasive ductal carcinoma, grade 3, estrogen and progesterone receptor positive, HER-2 not amplified, with an MIB-1 of 70%.    She underwent a colonoscopy on 02/24/2021 for screening with two sessile polyps found in the proximal rectum / recto-sigmoid colon. Pathology returned with tubular adenoma without high grade dysplasia or malignancy along with a hyperplastic polyp. Last breast imaging on 03/04/2021 of the right breast with no evidence of malignancy.   Last pap smear in EPIC from 02/11/2021 with Dr. Dione Plover resulting atypical glandular cells (high risk HPV neg) with recommendation for colposcopy and endometrial biopsy at that time. Upon review of EPIC, this has not taken place due to challenges with reaching the patient, recent ED visits/admits.  Past surgical history reviewed in EPIC. Family history includes a half sister with ovarian cancer with onset at 67 years of age.   Interval History: Discussion limited due to patient sleepiness ,slow to answer questions. Patient states she came to the ER due to her kidneys. Denies pelvic pain, vaginal bleeding, known history of abnormal pap smear prior to most recent in June 2022. Denies having past procedures on her cervix. No nausea or emesis reported at home but per son, since her last seizure, she has had decreased po intake. Patient reports having intermittent bloating at home. Denies diarrhea and constipation. She states she has lost 80 lbs but per family, there has been no weight loss. She reports going through menopause in her 80s and reports having two vaginal deliveries, son and daughter. Dr. Ernestina Patches called to speak with patient's son about recent history and about moving forward with bedside pelvic exam.  Review of Systems: Limited due to patient's alertness, see interval  Current Meds: Current inpatient medications reviewed.  Allergy:  Allergies  Allergen Reactions   Amlodipine Swelling and Rash    BLE edema    Social Hx:   Social History   Socioeconomic History   Marital status: Legally Separated    Spouse name: Not on file   Number of children: Not on  file   Years of education: Not on file   Highest education level: Not on file  Occupational History   Not on file  Tobacco Use   Smoking status: Never   Smokeless tobacco: Never  Vaping Use   Vaping Use: Never used  Substance and Sexual Activity   Alcohol use: Yes    Alcohol/week: 3.0 standard drinks of alcohol    Types: 1 Cans of beer, 2 Shots of liquor per week    Comment: daily   Drug use: Yes    Types: Marijuana   Sexual activity: Not Currently  Other Topics Concern   Not on file  Social History Narrative   Grand daughter stays with her   Mrs Ammon cares for her 19 developmental delayed daughter [name] with mental health issues at home - Previous was at a group home   Social Determinants of Health   Financial Resource Strain: Low Risk  (07/04/2020)   Overall Financial Resource Strain (CARDIA)    Difficulty of Paying Living Expenses: Not hard at all  Food Insecurity: No Food Insecurity (07/18/2022)   Hunger Vital Sign    Worried About Running Out of Food in the Last Year: Never true    [place] in the Last Year: Never true  Transportation Needs: No Transportation Needs (07/18/2022)   PRAPARE - Hydrologist (Medical): No    Lack of Transportation (Non-Medical): No  Physical Activity: Not on file  Stress: Stress Concern Present (12/10/2021)   [place]    Feeling of Stress : Rather much  Social Connections: Moderately Integrated (12/10/2021)   Social Connection and Isolation Panel [NHANES]    Frequency of Communication with Friends and Family: More than three times a week    Frequency of Social Gatherings with Friends and Family: More than three times a week    Attends Religious Services: 1 to 4 times per year    Active Member of Clubs or Organizations: Yes    Attends Archivist Meetings: 1 to 4 times per year    Marital Status: Separated  Intimate Partner  Violence: Not At Risk (07/18/2022)   Humiliation, Afraid, Rape, and Kick questionnaire    Fear of Current or Ex-Partner: No    Emotionally Abused: No    Physically Abused: No    Sexually Abused: No    Past Surgical Hx:  Past Surgical History:  Procedure Laterality Date   COLONOSCOPY WITH PROPOFOL N/A 02/24/2021   Procedure: COLONOSCOPY WITH PROPOFOL;  Surgeon: Yetta Flock, MD;  Location: [facility];  Service: Gastroenterology;  Laterality: N/A;   MASTECTOMY WITH AXILLARY LYMPH NODE DISSECTION Left 12/19/2019   Procedure: LEFT MASTECTOMY WITH AXILLARY LYMPH NODE DISSECTION;  Surgeon: [name], MD;  Location: [place];  Service: General;  Laterality: Left;   MULTIPLE EXTRACTIONS WITH ALVEOLOPLASTY Bilateral 12/14/2019   Procedure: MULTIPLE EXTRACTION WITH ALVEOLOPLASTY;  Surgeon: [name], DDS;  Location: [place];  Service: Oral Surgery;  Laterality: Bilateral;   POLYPECTOMY  02/24/2021   Procedure: POLYPECTOMY;  Surgeon: Yetta Flock, MD;  Location: [facility];  Service: Gastroenterology;;   [facility] PLACEMENT  N/A 12/19/2019   Procedure: INSERTION PORT-A-CATH WITH ULTRASOUND GUIDANCE;  Surgeon: [name], MD;  Location: [place];  Service: General;  Laterality: N/A;   RADIAL HEAD ARTHROPLASTY Right 06/25/2019   Procedure: RIGHT RADIAL HEAD ARTHROPLASTY;  Surgeon: [name], MD;  Location: [place];  Service: Orthopedics;  Laterality: Right;   TUBAL LIGATION      Past Medical Hx:  Past Medical History:  Diagnosis Date   Alcohol dependence ([place])    Aortic atherosclerosis ([place])    Arthritis    Cancer ([place]) 11/2019   Left breast   Chronic heart failure (HCC)    Chronic kidney disease, stage 3 ([place])    COVID-19    Diverticulitis    Family history of ovarian cancer    Heart murmur    11/26/19 echo: Mild AS. AV mean gradient 12.0 mmHg; however, LVOT gradient 8 mmHg with intracvitary gradient-significant AS is not suspected   Hypertension    Right  nephrolithiasis    Thoracic aortic aneurysm (HCC)    Tubular adenoma polyp of rectum     Family Hx:  Family History  Problem Relation Age of Onset   Hypertension Mother    Dementia Mother    Ovarian cancer Half-Sister 68   Diabetes Half-Sister    Stroke Half-Sister     Vitals:  Blood pressure (!) 126/55, pulse 95, temperature 98.5 F (36.9 C), temperature source Axillary, resp. rate 12, height _0  (1.626 m), weight 295 lb 6.7 oz (134 kg), SpO2 97 %.  CBC    Component Value Date/Time   WBC 8.5 07/19/2022 0957   RBC 3.01 (L) 07/19/2022 0957   HGB 8.7 (L) 07/19/2022 0957   HGB 9.8 (L) 10/16/2020 1547   HCT 29.0 (L) 07/19/2022 0957   HCT 29.8 (L) 10/16/2020 1547   PLT 218 07/19/2022 0957   PLT 236 10/16/2020 1547   MCV 96.3 07/19/2022 0957   MCV 94 10/16/2020 1547   MCH 28.9 07/19/2022 0957   MCHC 30.0 07/19/2022 0957   RDW 14.2 07/19/2022 0957   RDW 14.0 10/16/2020 1547   LYMPHSABS 1.1 07/19/2022 0957   LYMPHSABS 1.5 03/25/2020 1103   MONOABS 0.7 07/19/2022 0957   EOSABS 0.1 07/19/2022 0957   EOSABS 0.4 03/25/2020 1103   BASOSABS 0.0 07/19/2022 0957   BASOSABS 0.0 03/25/2020 1103   BMET    Component Value Date/Time   NA 139 07/19/2022 0957   NA 138 06/26/2020 0843   K 5.2 (H) 07/19/2022 0957   CL 108 07/19/2022 0957   CO2 19 (L) 07/19/2022 0957   GLUCOSE 66 (L) 07/19/2022 0957   BUN 44 (H) 07/19/2022 0957   BUN 23 06/26/2020 0843   CREATININE 5.96 (H) 07/19/2022 0957   CREATININE 1.59 (H) 11/21/2019 0908   CALCIUM 8.1 (L) 07/19/2022 0957   GFRNONAA 7 (L) 07/19/2022 0957   GFRNONAA 34 (L) 11/21/2019 0908     Physical Exam:  Slow to respond, answering questions appropriately but falling asleep during conversation. See Dr. Romona Curls addition about attempt at bedside pelvic examination.   Assessment/Plan: 66 year old female who presented to the ER with altered mental status with a suspected very large infiltrative pelvic mass spanning up to 19.9 cm on [place] found  incidentally on CT imaging. MRI of the pelvis is recommended and has been ordered to further evaluate the pelvic mass and questionable omental nodularity on CT imaging. CA 125 is pending. Given her Stage IIIA breast cancer diagnosis in 10/2019, breast cancer tumor  marker, CA 27.29 is recommended. Further recommendations based on MRI results along with pending tumor marker results per Dr. Ernestina Patches.     Dorothyann Gibbs, NP 07/19/2022, 4:16 PM

## 2022-07-19 NOTE — Progress Notes (Addendum)
[facility] Progress Note Patient: Zlata Alcaide CHE:[id number] DOB: 10/12/1955 DOA: 07/17/2022  DOS: the patient was seen and examined on 07/19/2022  Brief hospital course: PMH of CKD 3B, seizures, breast cancer, HTN, anemia presented to hospital with complaints of confusion and excessive sleepiness.  Found to have AKI on CKD 3B as well as acute hypercarbia with respiratory acidosis. Currently plan is to give IV hydration for AKI.  Nephrology consulted. BiPAP nightly. MRI brain negative.  EEG ordered.  Continues Keppra.  Hold gabapentin. Assessment and Plan: Acute encephalopathy.  Both metabolic and toxic In the setting of AKI and with ongoing use of gabapentin. Continue with IV hydration. Patient able to tolerate oral diet as per RN.  Still has asterixis on my exam.  Able to follow commands. BiPAP nightly and as needed. ABG did show some hypercarbia although suspect patient may have some chronic hypercarbia as well as chronic OSA.  History of seizures. Continue Keppra.  Keppra level elevated.  Consistent with poor clearance.  Monitor on current dose.  Likely switch to p.o. tomorrow. Patient is on gabapentin which I will hold. EEG negative for any acute seizures.  Shows encephalopathy.  AKI on CKD 3B Mild hyperkalemia. Most likely from poor p.o. intake. Currently we will continue with IV hydration. Oliguric therefore nephrology was consulted. Potassium level 5.2 stable. Appreciate assistance.  Continue with IV hydration.  Concern for UTI. Currently on IV antibiotic although low threshold to discontinue the antibiotic.  Pelvic mass/pelvic hemorrhage. H&H relatively stable. MRI pelvis ordered. Appreciate GYN oncology consultation. Tumor markers sent so far.  Hypoglycemia. We will check CBG every 4 hours.  Elevated free T4. Likely sick euthyroidism. For now we will monitor.  Chronic diastolic CHF monitor while receiving IV hydration.  Anemia of chronic  disease. H&H relatively stable. Monitor.  HTN. Patient was recently given extra dose of losartan due to uncontrolled hypertension. Currently holding that medication in the setting of AKI.  Polypharmacy. Patient was actually receiving her medications from blister pack. Was also discharged from the hospital with additional medication. Concern is that the patient was taking medications from the blister pack as well as from the discharge medication which most likely have resulted in her presentation with encephalopathy AKI. Monitor for now.  Morbid obesity.  Class III. Body mass index is 50.71 kg/m.  Placing the patient at high risk of poor outcome.  Left upper extremity edema. Per patient she has been told that she has left upper extremity lymphedema. 2 Dopplers recently were negative for any acute abnormality.   Subjective: No acute complaint.  No nausea no vomiting no fever no chills.  Tolerating oral diet.  Physical Exam: General: Appear in mild distress; Cardiovascular: S1 and S2 Present, no Murmur, Respiratory: good respiratory effort, Bilateral Air entry present, CTA, no Crackles, no wheezes Abdomen: Bowel Sound present, Non tender  Extremities: no Pedal edema Neurology: alert and oriented to self, asterixis present.  Data Reviewed: I have Reviewed nursing notes, Vitals, and Lab results. Since last encounter, pertinent lab results CBC and BMP   . I have ordered test including CBC and BMP  . I have discussed pt's care plan and test results with radiology and GYN  .    Disposition: Status is: Inpatient Remains inpatient appropriate because: Mentation still not back to baseline.  Still encephalopathic.  SCDs Start: 07/18/22 0231   Family Communication: No one at bedside.  Discussed with son and granddaughter on 11/19. Level of care: Stepdown continue stepdown level care due to  encephalopathy.  Vitals:   07/19/22 1100 07/19/22 1200 07/19/22 1300 07/19/22 1400  BP: (!)  111/55 (!) 123/57 127/65 (!) 126/55  Pulse: 95 92 93 95  Resp: '16 15 17 12  '$ Temp:      TempSrc:      SpO2: 93% 92% 98% 97%  Weight:      Height:         Author: Berle Mull, MD 07/19/2022 4:54 PM  Please look on [email or web address] to find out who is on call.

## 2022-07-19 NOTE — Procedures (Signed)
Patient Name: [name]  MRN: [id number]  Epilepsy Attending: Lora Havens  Referring Physician/Provider: [name], MD  Date: 07/19/2022 Duration: 22.43 mins  Patient history: 66 year old female with altered mental status.  EEG to evaluate for seizure.  Level of alertness: Awake  AEDs during EEG study: LEV  Technical aspects: This EEG study was done with scalp electrodes positioned according to the 10-20 International system of electrode placement. Electrical activity was reviewed with band pass filter of 1-'70Hz'$ , sensitivity of 7 uV/mm, display speed of 46m/sec with a '60Hz'$  notched filter applied as appropriate. EEG data were recorded continuously and digitally stored.  Video monitoring was available and reviewed as appropriate.  Description: No clear posterior dominant rhythm was seen. EEG showed continuous generalized predominantly 5 to 7 Hz theta slowing admixed with intermittent generalized 2 to 3 Hz delta slowing, at times with triphasic morphology.  Hyperventilation and photic stimulation were not performed.     ABNORMALITY - Continuous slow, generalized  IMPRESSION: This study is suggestive of moderate diffuse encephalopathy, nonspecific etiology. No seizures or definite epileptiform discharges were seen throughout the recording.  Yenny Kosa OBarbra Sarks

## 2022-07-19 NOTE — Progress Notes (Signed)
EEG complete - results pending 

## 2022-07-19 NOTE — Progress Notes (Signed)
Admit: 07/17/2022 LOS: 1  10F admit 11/18 with AKI, AMS, CKD3.    Subjective:  SCr unchanged over past 24h, K 5.2, HCO3 19 Oliguric BPs stable, on RA CT 11/19 with no HN, some concern for complex pelvic fluid / hemorrhage  11/19 0701 - 11/20 0700 In: 1219 [I.V.:1840.7; IV Piggyback:1532.3] Out: 220 [Urine:220]  Filed Weights   07/17/22 2252 07/18/22 0520  Weight: 129.7 kg 134 kg    Scheduled Meds:  Chlorhexidine Gluconate Cloth  6 each Topical Daily   mouth rinse  15 mL Mouth Rinse 4 times per day   Continuous Infusions:  sodium chloride 100 mL/hr at 07/19/22 1150   cefTRIAXone (ROCEPHIN)  IV Stopped (07/18/22 2200)   levETIRAcetam Stopped (07/19/22 0508)   PRN Meds:.acetaminophen **OR** acetaminophen, albuterol, naLOXone (NARCAN)  injection, mouth rinse  Current Labs: reviewed    Physical Exam:  Blood pressure (!) 111/55, pulse 95, temperature 98.5 F (36.9 C), temperature source Axillary, resp. rate 16, height '5\' 4"'$  (1.626 m), weight 134 kg, SpO2 93 %. Obese, NAD, interactive RRR nl s1s2 CTAB S/nt, no ascites Trace LEE at most  Nonfocal AAO x3  A AKI on CKD3b  (BL SCr 1.5 to 1.8) CT Abd at admission without acute structural kidney issues ? ATN with preceding hypovolemia Stable on IV NS Oliguric No immediate HD needs Pelvic mass / complex fluid collection: per TRH, will need additional imaging AHRF with hypercapniea: intermittent bipap, stable currently Hyperkalemia, mild, CTM UTI Cx+ for klebsiella on ceftriaxone per TRH Hx/o L breast cancer, some lympedema on LUE Hx/o seizures HTN, BPs stable no meds  P Cont supportive care, IV hydration until adequate PO No immediate HD needs, will follow closely Medication Issues; Preferred narcotic agents for pain control are hydromorphone, fentanyl, and methadone. Morphine should not be used.  Baclofen should be avoided Avoid oral sodium phosphate and magnesium citrate based laxatives / bowel preps    Pearson Grippe MD 07/19/2022, 1:43 PM  Recent Labs  Lab 07/17/22 2322 07/17/22 2342 07/18/22 0440 07/19/22 0957  NA 136 136 137 139  K 4.9 4.9 5.2* 5.2*  CL 102 103 104 108  CO2 25  --  22 19*  GLUCOSE 100* 97 101* 66*  BUN 45* 42* 45* 44*  CREATININE 6.23* 6.30* 6.02* 5.96*  CALCIUM 8.9  --  8.8* 8.1*  PHOS  --   --  6.0*  --    Recent Labs  Lab 07/17/22 2322 07/17/22 2342 07/18/22 0440 07/19/22 0957  WBC 6.5  --  8.6 8.5  NEUTROABS 4.3  --  6.3 6.5  HGB 10.2* 10.9* 9.3* 8.7*  HCT 33.5* 32.0* 30.7* 29.0*  MCV 94.9  --  95.3 96.3  PLT 240  --  227 218

## 2022-07-20 ENCOUNTER — Encounter (HOSPITAL_COMMUNITY)
Admission: EM | Disposition: A | Discharge status: Hospice | Payer: Self-pay | Source: Home / Self Care | Attending: Internal Medicine

## 2022-07-20 ENCOUNTER — Inpatient Hospital Stay (HOSPITAL_COMMUNITY): Payer: Medicare Other | Admitting: Anesthesiology

## 2022-07-20 ENCOUNTER — Other Ambulatory Visit: Payer: Self-pay

## 2022-07-20 ENCOUNTER — Encounter (HOSPITAL_COMMUNITY): Payer: Self-pay | Admitting: Internal Medicine

## 2022-07-20 DIAGNOSIS — N895 Stricture and atresia of vagina: Secondary | ICD-10-CM

## 2022-07-20 DIAGNOSIS — R1900 Intra-abdominal and pelvic swelling, mass and lump, unspecified site: Secondary | ICD-10-CM | POA: Diagnosis present

## 2022-07-20 DIAGNOSIS — I509 Heart failure, unspecified: Secondary | ICD-10-CM

## 2022-07-20 DIAGNOSIS — D631 Anemia in chronic kidney disease: Secondary | ICD-10-CM

## 2022-07-20 DIAGNOSIS — N1832 Chronic kidney disease, stage 3b: Secondary | ICD-10-CM | POA: Diagnosis not present

## 2022-07-20 DIAGNOSIS — N189 Chronic kidney disease, unspecified: Secondary | ICD-10-CM

## 2022-07-20 DIAGNOSIS — N179 Acute kidney failure, unspecified: Secondary | ICD-10-CM | POA: Diagnosis not present

## 2022-07-20 DIAGNOSIS — N859 Noninflammatory disorder of uterus, unspecified: Secondary | ICD-10-CM

## 2022-07-20 DIAGNOSIS — R87619 Unspecified abnormal cytological findings in specimens from cervix uteri: Secondary | ICD-10-CM | POA: Diagnosis not present

## 2022-07-20 DIAGNOSIS — I130 Hypertensive heart and chronic kidney disease with heart failure and stage 1 through stage 4 chronic kidney disease, or unspecified chronic kidney disease: Secondary | ICD-10-CM

## 2022-07-20 LAB — RENAL FUNCTION PANEL
Albumin: 2.4 g/dL — ABNORMAL LOW (ref 3.5–5.0)
Anion gap: 10 (ref 5–15)
BUN: 45 mg/dL — ABNORMAL HIGH (ref 8–23)
CO2: 18 mmol/L — ABNORMAL LOW (ref 22–32)
Calcium: 8 mg/dL — ABNORMAL LOW (ref 8.9–10.3)
Chloride: 108 mmol/L (ref 98–111)
Creatinine, Ser: 5.88 mg/dL — ABNORMAL HIGH (ref 0.44–1.00)
GFR, Estimated: 7 mL/min — ABNORMAL LOW (ref >60)
Glucose, Bld: 102 mg/dL — ABNORMAL HIGH (ref 70–99)
Phosphorus: 5.3 mg/dL — ABNORMAL HIGH (ref 2.5–4.6)
Potassium: 4.7 mmol/L (ref 3.5–5.1)
Sodium: 136 mmol/L (ref 135–145)

## 2022-07-20 LAB — CBC WITH DIFFERENTIAL/PLATELET
Abs Immature Granulocytes: 0.07 10*3/uL (ref 0.00–0.07)
Basophils Absolute: 0 10*3/uL (ref 0.0–0.1)
Basophils Relative: 0 %
Eosinophils Absolute: 0.2 10*3/uL (ref 0.0–0.5)
Eosinophils Relative: 2 %
HCT: 28.2 % — ABNORMAL LOW (ref 36.0–46.0)
Hemoglobin: 8.6 g/dL — ABNORMAL LOW (ref 12.0–15.0)
Immature Granulocytes: 1 %
Lymphocytes Relative: 14 %
Lymphs Abs: 1 10*3/uL (ref 0.7–4.0)
MCH: 28.7 pg (ref 26.0–34.0)
MCHC: 30.5 g/dL (ref 30.0–36.0)
MCV: 94 fL (ref 80.0–100.0)
Monocytes Absolute: 0.7 10*3/uL (ref 0.1–1.0)
Monocytes Relative: 10 %
Neutro Abs: 5.4 10*3/uL (ref 1.7–7.7)
Neutrophils Relative %: 73 %
Platelets: 211 10*3/uL (ref 150–400)
RBC: 3 MIL/uL — ABNORMAL LOW (ref 3.87–5.11)
RDW: 14.5 % (ref 11.5–15.5)
WBC: 7.3 10*3/uL (ref 4.0–10.5)
nRBC: 0 % (ref 0.0–0.2)

## 2022-07-20 LAB — GLUCOSE, CAPILLARY
Glucose-Capillary: 100 mg/dL — ABNORMAL HIGH (ref 70–99)
Glucose-Capillary: 102 mg/dL — ABNORMAL HIGH (ref 70–99)
Glucose-Capillary: 104 mg/dL — ABNORMAL HIGH (ref 70–99)
Glucose-Capillary: 105 mg/dL — ABNORMAL HIGH (ref 70–99)
Glucose-Capillary: 91 mg/dL (ref 70–99)
Glucose-Capillary: 94 mg/dL (ref 70–99)

## 2022-07-20 LAB — URINE CULTURE: Culture: >=100000 — AB

## 2022-07-20 LAB — MAGNESIUM: Magnesium: 1.8 mg/dL (ref 1.7–2.4)

## 2022-07-20 LAB — CA 125: Cancer Antigen (CA) 125: 179 U/mL — ABNORMAL HIGH (ref 0.0–38.1)

## 2022-07-20 SURGERY — EXAM UNDER ANESTHESIA
Anesthesia: Monitor Anesthesia Care | Site: Vagina

## 2022-07-20 MED ORDER — PROPOFOL 10 MG/ML IV BOLUS
INTRAVENOUS | Status: AC
  Filled 2022-07-20: qty 20

## 2022-07-20 MED ORDER — FENTANYL CITRATE PF 50 MCG/ML IJ SOSY
PREFILLED_SYRINGE | INTRAMUSCULAR | Status: AC
  Administered 2022-07-21: 12.5 ug via INTRAVENOUS
  Filled 2022-07-20: qty 1

## 2022-07-20 MED ORDER — SILVER NITRATE-POT NITRATE 75-25 % EX MISC
CUTANEOUS | Status: AC
  Filled 2022-07-20: qty 10

## 2022-07-20 MED ORDER — LIDOCAINE HCL (PF) 1 % IJ SOLN
INTRAMUSCULAR | Status: AC
  Filled 2022-07-20: qty 30

## 2022-07-20 MED ORDER — CEFAZOLIN SODIUM-DEXTROSE 1-4 GM/50ML-% IV SOLN
1.0000 g | Freq: Two times a day (BID) | INTRAVENOUS | Status: DC
  Administered 2022-07-20 – 2022-07-22 (×4): 1 g via INTRAVENOUS
  Filled 2022-07-20 (×5): qty 50

## 2022-07-20 MED ORDER — ONDANSETRON HCL 4 MG/2ML IJ SOLN
INTRAMUSCULAR | Status: DC | PRN
  Administered 2022-07-20: 4 mg via INTRAVENOUS

## 2022-07-20 MED ORDER — FENTANYL CITRATE PF 50 MCG/ML IJ SOSY: 12.5000 ug | PREFILLED_SYRINGE | Freq: Once | INTRAMUSCULAR | Status: AC

## 2022-07-20 MED ORDER — ACETAMINOPHEN 500 MG PO TABS
500.0000 mg | ORAL_TABLET | Freq: Four times a day (QID) | ORAL | Status: AC
  Administered 2022-07-21 (×3): 500 mg via ORAL
  Filled 2022-07-20 (×3): qty 1

## 2022-07-20 MED ORDER — FENTANYL CITRATE (PF) 100 MCG/2ML IJ SOLN
INTRAMUSCULAR | Status: AC
  Filled 2022-07-20: qty 2

## 2022-07-20 MED ORDER — ACETAMINOPHEN 10 MG/ML IV SOLN
1000.0000 mg | Freq: Once | INTRAVENOUS | Status: AC
  Administered 2022-07-20: 1000 mg via INTRAVENOUS
  Filled 2022-07-20: qty 100

## 2022-07-20 MED ORDER — FENTANYL CITRATE (PF) 100 MCG/2ML IJ SOLN
INTRAMUSCULAR | Status: DC | PRN
  Administered 2022-07-20: 25 ug via INTRAVENOUS

## 2022-07-20 MED ORDER — POLYETHYLENE GLYCOL 3350 17 G PO PACK
17.0000 g | PACK | Freq: Every day | ORAL | Status: DC
  Administered 2022-07-21 – 2022-07-31 (×8): 17 g via ORAL
  Filled 2022-07-20 (×12): qty 1

## 2022-07-20 MED ORDER — ACETIC ACID 5 % SOLN
Status: AC
  Filled 2022-07-20: qty 50

## 2022-07-20 MED ORDER — SENNOSIDES-DOCUSATE SODIUM 8.6-50 MG PO TABS
2.0000 | ORAL_TABLET | Freq: Two times a day (BID) | ORAL | Status: DC
  Administered 2022-07-21 – 2022-08-03 (×23): 2 via ORAL
  Filled 2022-07-20 (×27): qty 2

## 2022-07-20 MED ORDER — BISACODYL 10 MG RE SUPP: 10.0000 mg | Freq: Every day | RECTAL | Status: DC | PRN

## 2022-07-20 MED ORDER — PROPOFOL 10 MG/ML IV BOLUS
INTRAVENOUS | Status: DC | PRN
  Administered 2022-07-20 (×2): 10 mg via INTRAVENOUS

## 2022-07-20 MED ORDER — FERRIC SUBSULFATE 259 MG/GM EX SOLN
CUTANEOUS | Status: AC
  Filled 2022-07-20: qty 8

## 2022-07-20 MED ORDER — DEXAMETHASONE SODIUM PHOSPHATE 10 MG/ML IJ SOLN
INTRAMUSCULAR | Status: AC
  Filled 2022-07-20: qty 1

## 2022-07-20 MED ORDER — LIP MEDEX EX OINT
TOPICAL_OINTMENT | CUTANEOUS | Status: DC | PRN
  Filled 2022-07-20: qty 7

## 2022-07-20 MED ORDER — 0.9 % SODIUM CHLORIDE (POUR BTL) OPTIME
TOPICAL | Status: DC | PRN
  Administered 2022-07-20: 1000 mL

## 2022-07-20 MED ORDER — ONDANSETRON HCL 4 MG/2ML IJ SOLN
INTRAMUSCULAR | Status: AC
  Filled 2022-07-20: qty 2

## 2022-07-20 MED ORDER — PROPOFOL 500 MG/50ML IV EMUL
INTRAVENOUS | Status: DC | PRN
  Administered 2022-07-20: 75 ug/kg/min via INTRAVENOUS

## 2022-07-20 MED ORDER — LACTATED RINGERS IV SOLN: INTRAVENOUS | Status: DC

## 2022-07-20 MED ORDER — LIDOCAINE HCL (PF) 2 % IJ SOLN
INTRAMUSCULAR | Status: AC
  Filled 2022-07-20: qty 5

## 2022-07-20 MED ORDER — CHLORHEXIDINE GLUCONATE CLOTH 2 % EX PADS
6.0000 | MEDICATED_PAD | CUTANEOUS | Status: AC
  Administered 2022-07-20: 6 via TOPICAL

## 2022-07-20 SURGICAL SUPPLY — 16 items
BLADE SURG 15 STRL LF DISP TIS (BLADE) IMPLANT
BLADE SURG 15 STRL SS (BLADE)
BLADE SURG SZ11 CARB STEEL (BLADE) IMPLANT
CATH ROBINSON RED A/P 16FR (CATHETERS) IMPLANT
CURETTE PIPELLE ENDOMTRL SUCTN (MISCELLANEOUS) IMPLANT
GAUZE 4X4 16PLY ~~LOC~~+RFID DBL (SPONGE) ×2 IMPLANT
GLOVE BIO SURGEON STRL SZ 6 (GLOVE) ×4 IMPLANT
KIT TURNOVER CYSTO (KITS) ×2 IMPLANT
NDL HYPO 25X1 1.5 SAFETY (NEEDLE) ×2 IMPLANT
NEEDLE HYPO 25X1 1.5 SAFETY (NEEDLE) ×1 IMPLANT
PACK PERINEAL COLD (PAD) ×2 IMPLANT
PACK VAGINAL WOMENS (CUSTOM PROCEDURE TRAY) ×2 IMPLANT
PIPELLE ENDOMETRIAL SUCTION CU (MISCELLANEOUS) ×1
SYR BULB IRRIG 60ML STRL (SYRINGE) ×2 IMPLANT
TOWEL OR 17X26 10 PK STRL BLUE (TOWEL DISPOSABLE) ×4 IMPLANT
WATER STERILE IRR 500ML POUR (IV SOLUTION) ×2 IMPLANT

## 2022-07-20 NOTE — Progress Notes (Signed)
Admit: 07/17/2022 LOS: 2  89F admit 11/18 with AKI, AMS, CKD3.    Subjective:  SCr slightly lower over past 24h, K 4.7, HCO3 18 Oliguric 0.375L UOP past 24h Remains on IVFs BPs stable, on RA GYN following MRI confirmd pelvic mass likely GYN origin, CA125 elevated; for sampling/biopsy  11/20 0701 - 11/21 0700 In: 3301.1 [P.O.:780; I.V.:2221; IV Piggyback:300.1] Out: 375 [Urine:375]  Filed Weights   07/17/22 2252 07/18/22 0520 07/20/22 0500  Weight: 129.7 kg 134 kg (!) 142 kg    Scheduled Meds:  acetaminophen  500 mg Oral Q6H   Chlorhexidine Gluconate Cloth  6 each Topical Daily   mouth rinse  15 mL Mouth Rinse 4 times per day   polyethylene glycol  17 g Oral Daily   senna-docusate  2 tablet Oral BID   Continuous Infusions:  sodium chloride 100 mL/hr at 07/20/22 1310    ceFAZolin (ANCEF) IV     levETIRAcetam Stopped (07/20/22 0420)   PRN Meds:.acetaminophen **OR** acetaminophen, albuterol, bisacodyl, naLOXone (NARCAN)  injection, mouth rinse  Current Labs: reviewed    Physical Exam:  Blood pressure 114/64, pulse 94, temperature 99.4 F (37.4 C), temperature source Axillary, resp. rate 18, height '5\' 4"'$  (1.626 m), weight (!) 142 kg, SpO2 95 %. Obese, NAD, interactive RRR nl s1s2 CTAB S/nt, no ascites Trace LEE at most  Nonfocal AAO x3  A AKI on CKD3b  (BL SCr 1.5 to 1.8) CT Abd at admission without acute structural kidney issues ? ATN with preceding hypovolemia Reduce IVFs and change to LR today Oliguric No immediate HD needs but worrisome that things are not improving Pelvic mass / complex fluid collection: per GYN, MRI confirmed likely pelvic malignancy with carcinomatosis, for pelvic exam and biopsies it appears AHRF with hypercapniea: intermittent bipap, stable currently Hyperkalemia, mild, improved and K < 5 UTI Cx+ for klebsiella on ceftriaxone per TRH Hx/o L breast cancer, some lympedema on LUE Hx/o seizures HTN, BPs stable no meds  P Cont supportive  care, IV hydration until adequate PO as above No immediate HD needs, will follow closely Medication Issues; Preferred narcotic agents for pain control are hydromorphone, fentanyl, and methadone. Morphine should not be used.  Baclofen should be avoided Avoid oral sodium phosphate and magnesium citrate based laxatives / bowel preps    Pearson Grippe MD 07/20/2022, 1:27 PM  Recent Labs  Lab 07/18/22 0440 07/19/22 0957 07/20/22 0530  NA 137 139 136  K 5.2* 5.2* 4.7  CL 104 108 108  CO2 22 19* 18*  GLUCOSE 101* 66* 102*  BUN 45* 44* 45*  CREATININE 6.02* 5.96* 5.88*  CALCIUM 8.8* 8.1* 8.0*  PHOS 6.0*  --  5.3*    Recent Labs  Lab 07/18/22 0440 07/19/22 0957 07/20/22 0530  WBC 8.6 8.5 7.3  NEUTROABS 6.3 6.5 5.4  HGB 9.3* 8.7* 8.6*  HCT 30.7* 29.0* 28.2*  MCV 95.3 96.3 94.0  PLT 227 218 211

## 2022-07-20 NOTE — Transfer of Care (Signed)
Immediate Anesthesia Transfer of Care Note  Patient: [name]  Procedure(s) Performed: EXAM UNDER ANESTHESIA, CERVICAL BIOPSY, ENDOMETRIAL BIOPSY (Vagina )  Patient Location: ICU  Anesthesia Type:MAC  Level of Consciousness: awake, alert , and patient cooperative  Airway & Oxygen Therapy: Patient Spontanous Breathing and Patient connected to face mask oxygen  Post-op Assessment: Report given to RN, Post -op Vital signs reviewed and stable, and Patient moving all extremities  Post vital signs: Reviewed and stable  Last Vitals:  Vitals Value Taken Time  BP 123/69 07/20/22 1506  Temp    Pulse 92 07/20/22 1510  Resp 13 07/20/22 1510  SpO2 100 % 07/20/22 1510  Vitals shown include unvalidated device data.  Last Pain:  Vitals:   07/20/22 1200  TempSrc: Axillary  PainSc:          Complications: No notable events documented.

## 2022-07-20 NOTE — Progress Notes (Signed)
       Overnight   NAME: [name] MRN: [id number] DOB : 07-Apr-1956  Notified by RN for concern over obvious pain denoted by vitals, grimace and audible groan, yell.   Bedside visit exhibits obvious pain indicators.     Date of Service   07/20/2022   HPI/Events of Note   Acute encephalopathy.  Both metabolic and toxic In the setting of AKI and with ongoing use of gabapentin. Continue with IV hydration. Still with significant asterixis as well as myoclonic jerks.  Able to follow commands though. BiPAP nightly and as needed. ABG did show some hypercarbia although suspect patient may have some chronic hypercarbia as well as chronic OSA. High risk for intubation.  Minimize narcotics and sedating medications.   Interventions/ Plan   Pain exhibited - low dose/single dose short acting opioid ordered after consult w night attending.   2 . Reassessment ongoing  3. Will continue minimization of narcotics as noted by Attending          Gershon Cull BSN MSNA MSN [place] Nurse Practitioner [place]

## 2022-07-20 NOTE — Anesthesia Preprocedure Evaluation (Signed)
Anesthesia Evaluation  Patient identified by MRN, date of birth, ID band Patient awake    Reviewed: Allergy & Precautions, NPO status , Patient's Chart, lab work & pertinent test results  Airway Mallampati: III  TM Distance: >3 FB Neck ROM: Full    Dental  (+) Dental Advisory Given   Pulmonary sleep apnea    breath sounds clear to auscultation       Cardiovascular hypertension, +CHF   Rhythm:Regular Rate:Normal     Neuro/Psych Seizures -,     GI/Hepatic negative GI ROS,,,(+) Hepatitis -  Endo/Other    Morbid obesity  Renal/GU CRFRenal disease     Musculoskeletal  (+) Arthritis ,    Abdominal   Peds  Hematology  (+) Blood dyscrasia, anemia   Anesthesia Other Findings   Reproductive/Obstetrics                             Anesthesia Physical Anesthesia Plan  ASA: 4  Anesthesia Plan: MAC   Post-op Pain Management: Minimal or no pain anticipated   Induction:   PONV Risk Score and Plan: 2 and Propofol infusion and Ondansetron  Airway Management Planned: Natural Airway, Mask and Nasal CPAP  Additional Equipment:   Intra-op Plan:   Post-operative Plan:   Informed Consent: I have reviewed the patients History and Physical, chart, labs and discussed the procedure including the risks, benefits and alternatives for the proposed anesthesia with the patient or authorized representative who has indicated his/her understanding and acceptance.       Plan Discussed with: CRNA and Surgeon  Anesthesia Plan Comments:        Anesthesia Quick Evaluation

## 2022-07-20 NOTE — Anesthesia Procedure Notes (Addendum)
Procedure Name: MAC Date/Time: 07/20/2022 2:10 PM  Performed by: Eben Burow, CRNAPre-anesthesia Checklist: Patient identified, Emergency Drugs available, Suction available, Patient being monitored and Timeout performed Patient Re-evaluated:Patient Re-evaluated prior to induction Oxygen Delivery Method: Circle system utilized Preoxygenation: Pre-oxygenation with 100% oxygen Induction Type: IV induction Ventilation: Oral airway inserted - appropriate to patient size Placement Confirmation: positive ETCO2

## 2022-07-20 NOTE — Anesthesia Postprocedure Evaluation (Signed)
Anesthesia Post Note  Patient: [name]  Procedure(s) Performed: EXAM UNDER ANESTHESIA, CERVICAL BIOPSY, ENDOMETRIAL BIOPSY (Vagina )     Patient location during evaluation: ICU Anesthesia Type: MAC Level of consciousness: awake and alert Pain management: pain level controlled Vital Signs Assessment: post-procedure vital signs reviewed and stable Respiratory status: spontaneous breathing, nonlabored ventilation, respiratory function stable and patient connected to nasal cannula oxygen Cardiovascular status: stable and blood pressure returned to baseline Postop Assessment: no apparent nausea or vomiting Anesthetic complications: no  No notable events documented.  Last Vitals:  Vitals:   07/20/22 1300 07/20/22 1400  BP: (!) 107/58 111/71  Pulse: 90   Resp: 14 (!) 21  Temp:    SpO2: 98%     Last Pain:  Vitals:   07/20/22 1200  TempSrc: Axillary  PainSc:                  [name]

## 2022-07-20 NOTE — Op Note (Signed)
GYNECOLOGIC ONCOLOGY OPERATIVE NOTE  Date of Service: 07/17/2022  Preoperative Diagnosis: Pelvic mass, atypical glandular cells on pap, fluid filled endometrial cavity  Postoperative Diagnosis: Same  Procedures: Pelvic exam under anesthesia, cervical biopsy, endocervical curettings, endometrial biopsy  Surgeon: Bernadene Bell, MD  Assistants: Lahoma Crocker, MD  Anesthesia: MAC  Estimated Blood Loss: 10 mL   Urine Output: Clear yellow urine in foley that was in place prior to OR. Not emptied prior to procedure.  Findings: On bimanual exam, cervical pulled cephalad and narrowing of vaginal apex. Cervix firm on palpation. Unable to palpate uterus due to patient body habitus. On speculum exam, no discrete cervical mass. Grittiness of the endocervical canal. Proliferative tissue obtained on endometrial biopsy and curetting. On rectovaginal exam, firm fixed mass in posterior cul-de-sac. Thickening of left parametrial tissue.   Specimens:  ID Type Source Tests Collected by Time Destination  1 : endocervical curettings Tissue PATH Other SURGICAL PATHOLOGY Bernadene Bell, MD 07/20/2022 1425   2 : cervical biopsies at 12 o'clock Tissue PATH Other SURGICAL PATHOLOGY Bernadene Bell, MD 07/20/2022 1435   3 : cervical biopsies at 6 o'clock Tissue PATH Other SURGICAL PATHOLOGY Bernadene Bell, MD 07/20/2022 1437   4 : endometrial curettings Tissue PATH Other SURGICAL PATHOLOGY Bernadene Bell, MD [id number]     Complications:  None  Indications for Procedure: [name] is a 66 y.o. woman who was admitted to the hospital for acute kidney injury and encephalopathy. She was incidentally found on imaging to have a complex pelvic mass and fluid. Follow-up imaging with MRI also demonstrated a fluid filled endometrial cavity. Patient has a history of pap with atypical glandular cells with no subsequent evaluation. Exam and tissue sampling was attempted but unsuccessful in the patient  room. Patient to the OR for tissue sampling.  Prior to the procedure, all risks, benefits, and alternatives were discussed and informed surgical consent was verbal obtained by phone consent with patient's son.  Procedure: Patient was taken to the operating room where sedation with MAC was achieved.  She was positioned in dorsal lithotomy and prepped and draped in sterile fashion.  A Foley catheter was already in place in the bladder.  The above findings were noted.  A speculum was placed in the vagina. A tenaculum was placed on the posterior lip of the cervix and the remainder of the cervix was brought into view. No discrete mass was noted on the cervix, but due to it's firmness on exam, a biopsy was obtained at 12 and 6 o'clock with the Tischler forceps. Then, the ECC curette was used to obtain and endocervical curettage. Next, the cervix was serially diated with pratt dilators. An endometrial pipelle was advanced to the uterine fundus, approximately 12cm and a sample was obtained over 2 passes. Due to poor suction with the pipelle, additional endometrial sample was obtained with a gentle curettage with a Simm's curette. The tenaculum was removed and no bleeding was noted. All instruments were removed from the vagina.   Patient tolerated the procedure well. Sponge, lap, and instrument counts were correct.  No perioperative antibiotics were indicated for this procedure.  Patient was extubated and taken to the PACU in stable condition.  Bernadene Bell, MD Gynecologic Oncology

## 2022-07-20 NOTE — Progress Notes (Signed)
[facility] Progress Note Patient: [name] VEL:[id number] DOB: 1956/07/21 DOA: 07/17/2022  DOS: the patient was seen and examined on 07/20/2022  Brief hospital course: PMH of CKD 3B, seizures, breast cancer, HTN, anemia presented to hospital with complaints of confusion and excessive sleepiness.  Found to have AKI on CKD 3B as well as acute hypercarbia with respiratory acidosis. Currently plan is to give IV hydration for AKI.  Nephrology consulted. BiPAP nightly. MRI brain negative.  EEG ordered.  Continues Keppra.  Hold gabapentin. Underwent biopsy of the adnexal mass on 11/21. Assessment and Plan: Acute encephalopathy.  Both metabolic and toxic In the setting of AKI and with ongoing use of gabapentin. Continue with IV hydration. Still with significant asterixis as well as myoclonic jerks.  Able to follow commands though. BiPAP nightly and as needed. ABG did show some hypercarbia although suspect patient may have some chronic hypercarbia as well as chronic OSA. High risk for intubation.  Minimize narcotics and sedating medications.  History of seizures. Continue Keppra.  Keppra level elevated.  Consistent with poor clearance.  Continue IV Keppra for now. Patient is on gabapentin which I will hold. EEG negative for any acute seizures.  Shows encephalopathy.  AKI on CKD 3B Mild hyperkalemia. Most likely from poor p.o. intake. Currently we will continue with IV hydration. Oliguric therefore nephrology was consulted. Potassium stable. Appreciate assistance.  Continue with IV hydration.  Concern for UTI. Was on IV antibiotic.  Completed 3 days of IV Rocephin.  Pelvic mass/pelvic hemorrhage. H&H relatively stable. MRI pelvis shows large mass 17 cm around right adnexal region crossing the midline encasing the uterus.  There is another mass of 11.2 cm in the left lower quadrant.  Concerning for ovarian or endometrial malignancy.  There also appears to be carcinomatosis and  lymphadenopathy in the pelvic region. Underwent biopsy of this mass on 11/21 in OR. Appreciate GYN oncology consultation. See appointment 5 elevated.  Moderate ascites. MRI pelvis also shows evidence of moderate ascites.  Monitor.  Generalized pain. Currently using Tylenol as the patient is not a good candidate for NSAIDs and utilizing narcotics will worsen her mentation and hypercarbia.  Hypoglycemia. We will check CBG every 4 hours.  Elevated free T4. Likely sick euthyroidism. For now we will monitor.  History of breast cancer.  Stage IIIa invasive ductal carcinoma ER positive. Sees oncology outpatient.  Chronic diastolic CHF monitor while receiving IV hydration.  Anemia of chronic disease. H&H relatively stable. Monitor.  HTN. Patient was recently given extra dose of losartan due to uncontrolled hypertension. Currently holding that medication in the setting of AKI.  Polypharmacy. Patient was actually receiving her medications from blister pack. Was also discharged from the hospital with additional medication. Concern is that the patient was taking medications from the blister pack as well as from the discharge medication which most likely have resulted in her presentation with encephalopathy AKI. Monitor for now.  Morbid obesity.  Class III. Body mass index is 53.74 kg/m.  Placing the patient at high risk of poor outcome.  Left upper extremity edema. Per patient she has been told that she has left upper extremity lymphedema. 2 Dopplers recently were negative for any acute abnormality.   Subjective: Reports has body ache.  No nausea no vomiting no fever no chills.  On further questioning reports abdominal pain.  Appears to be more fatigued and tired compared to yesterday.  Physical Exam: General: Appear in moderate distress; Cardiovascular: S1 and S2 Present, no Murmur, Respiratory: good respiratory  effort, Bilateral Air entry present, CTA, no Crackles, no  wheezes Abdomen: Bowel Sound present, diffusely tender Extremities: trace Pedal edema Neurology: lethargic and oriented to self, able to follow commands.  Data Reviewed: I have Reviewed nursing notes, Vitals, and Lab results. Since last encounter, pertinent lab results CBC and BMP   . I have ordered test including CBC and BMP  . I have discussed pt's care plan and test results with GYN oncology  .     Disposition: Status is: Inpatient Remains inpatient appropriate because: Mentation still not back to baseline.  Still encephalopathic.  SCDs Start: 07/18/22 0231   Family Communication: No one at bedside.  Discussed with son and granddaughter on 11/19. Level of care: Stepdown continue stepdown level care due to encephalopathy.  Vitals:   07/20/22 1545 07/20/22 1600 07/20/22 1615 07/20/22 1700  BP: (!) 128/91 (!) 102/58 117/78 128/69  Pulse: 91 93 90 79  Resp: '16 16 15 17  '$ Temp:  97.9 F (36.6 C)    TempSrc:  Axillary    SpO2: 94% 93% 94% 92%  Weight:      Height:         Author: Berle Mull, MD 07/20/2022 6:47 PM  Please look on [email or web address] to find out who is on call.

## 2022-07-20 NOTE — Progress Notes (Signed)
GYN Oncology Progress Note  Plan for: Day of Surgery Procedure(s) (LRB): EXAM UNDER ANESTHESIA, CERVICAL BIOPSY, ENDOMETRIAL BIOPSY (N/A)  Subjective: Patient sleeping with CPAP on. Opens eyes to stimuli then falls back to sleep. Dr. Ernestina Patches spoke with the son on the phone about the MRI findings and recommendation to move forward with a pelvic examination under anesthesia, cervical biopsies, endometrial biopsy/curettings, and any other indicated procedures. Son is agreeable and gives verbal consent over the phone to myself and Dr. Ernestina Patches for the procedure and blood transfusion in case of emergencies.   Objective: Vital signs in last 24 hours: Temp:  [97.9 F (36.6 C)-99.1 F (37.3 C)] 98 F (36.7 C) (11/21 0800) Pulse Rate:  [83-95] 86 (11/21 0800) Resp:  [12-20] 14 (11/21 0800) BP: (93-127)/(49-65) 99/56 (11/21 0800) SpO2:  [92 %-100 %] 100 % (11/21 0800) Weight:  [313 lb 0.9 oz (142 kg)] 313 lb 0.9 oz (142 kg) (11/21 0500) Last BM Date : 07/18/22  Intake/Output from previous day: 11/20 0701 - 11/21 0700 In: 3301.1 [P.O.:780; I.V.:2221; IV Piggyback:300.1] Out: 375 [Urine:375]  Physical Examination: Deferred due to patient sleeping  Labs: WBC/Hgb/Hct/Plts:  7.3/8.6/28.2/211 (11/21 0530) BUN/Cr/glu/ALT/AST/amyl/lip:  45/5.88/--/--/--/--/-- (11/21 0530)  CA 125 179  MR Pelvis awaiting report. Images reviewed by Dr. Ernestina Patches  Assessment/Plan: 66 y.o. with plans for below procedure(s) later today: EXAM UNDER ANESTHESIA, CERVICAL BIOPSY, ENDOMETRIAL BIOPSY/CURETTINGS Diet to NPO for procedure Consent obtained and placed in chart with son verbally over the phone with Dr. Ernestina Patches and myself as witness No intra-op abx needed Continue plan of care per Hospitalist Team   LOS: 2 days    Dorothyann Gibbs 07/20/2022, 9:42 AM

## 2022-07-21 ENCOUNTER — Telehealth: Payer: Self-pay | Admitting: Psychiatry

## 2022-07-21 ENCOUNTER — Encounter (HOSPITAL_COMMUNITY): Payer: Self-pay | Admitting: Internal Medicine

## 2022-07-21 DIAGNOSIS — D638 Anemia in other chronic diseases classified elsewhere: Secondary | ICD-10-CM | POA: Diagnosis not present

## 2022-07-21 DIAGNOSIS — G9341 Metabolic encephalopathy: Secondary | ICD-10-CM | POA: Diagnosis not present

## 2022-07-21 DIAGNOSIS — N179 Acute kidney failure, unspecified: Secondary | ICD-10-CM | POA: Diagnosis not present

## 2022-07-21 DIAGNOSIS — I10 Essential (primary) hypertension: Secondary | ICD-10-CM | POA: Diagnosis not present

## 2022-07-21 LAB — GLUCOSE, CAPILLARY
Glucose-Capillary: 84 mg/dL (ref 70–99)
Glucose-Capillary: 87 mg/dL (ref 70–99)
Glucose-Capillary: 94 mg/dL (ref 70–99)
Glucose-Capillary: 93 mg/dL (ref 70–99)
Glucose-Capillary: 73 mg/dL (ref 70–99)

## 2022-07-21 LAB — CBC WITH DIFFERENTIAL/PLATELET
Abs Immature Granulocytes: 0.06 10*3/uL (ref 0.00–0.07)
Basophils Absolute: 0 10*3/uL (ref 0.0–0.1)
Basophils Relative: 0 %
Eosinophils Absolute: 0.1 10*3/uL (ref 0.0–0.5)
Eosinophils Relative: 2 %
HCT: 33.9 % — ABNORMAL LOW (ref 36.0–46.0)
Hemoglobin: 9.9 g/dL — ABNORMAL LOW (ref 12.0–15.0)
Immature Granulocytes: 1 %
Lymphocytes Relative: 12 %
Lymphs Abs: 1.1 10*3/uL (ref 0.7–4.0)
MCH: 29 pg (ref 26.0–34.0)
MCHC: 29.2 g/dL — ABNORMAL LOW (ref 30.0–36.0)
MCV: 99.4 fL (ref 80.0–100.0)
Monocytes Absolute: 0.8 10*3/uL (ref 0.1–1.0)
Monocytes Relative: 9 %
Neutro Abs: 6.7 10*3/uL (ref 1.7–7.7)
Neutrophils Relative %: 76 %
Platelets: 210 10*3/uL (ref 150–400)
RBC: 3.41 MIL/uL — ABNORMAL LOW (ref 3.87–5.11)
RDW: 14.6 % (ref 11.5–15.5)
WBC: 8.8 10*3/uL (ref 4.0–10.5)
nRBC: 0 % (ref 0.0–0.2)

## 2022-07-21 LAB — RENAL FUNCTION PANEL
Albumin: 2.4 g/dL — ABNORMAL LOW (ref 3.5–5.0)
Anion gap: 9 (ref 5–15)
BUN: 43 mg/dL — ABNORMAL HIGH (ref 8–23)
CO2: 20 mmol/L — ABNORMAL LOW (ref 22–32)
Calcium: 8.3 mg/dL — ABNORMAL LOW (ref 8.9–10.3)
Chloride: 110 mmol/L (ref 98–111)
Creatinine, Ser: 5.09 mg/dL — ABNORMAL HIGH (ref 0.44–1.00)
GFR, Estimated: 9 mL/min — ABNORMAL LOW (ref >60)
Glucose, Bld: 88 mg/dL (ref 70–99)
Phosphorus: 5.3 mg/dL — ABNORMAL HIGH (ref 2.5–4.6)
Potassium: 4.6 mmol/L (ref 3.5–5.1)
Sodium: 139 mmol/L (ref 135–145)

## 2022-07-21 LAB — SURGICAL PATHOLOGY

## 2022-07-21 LAB — MAGNESIUM: Magnesium: 1.8 mg/dL (ref 1.7–2.4)

## 2022-07-21 LAB — CANCER ANTIGEN 27.29: CA 27.29: <9 U/mL (ref 0.0–38.6)

## 2022-07-21 MED ORDER — FENTANYL CITRATE PF 50 MCG/ML IJ SOSY
12.5000 ug | PREFILLED_SYRINGE | INTRAMUSCULAR | Status: DC | PRN
  Administered 2022-07-21 – 2022-07-22 (×5): 12.5 ug via INTRAVENOUS
  Filled 2022-07-21 (×6): qty 1

## 2022-07-21 MED ORDER — OXYCODONE HCL 5 MG PO TABS
5.0000 mg | ORAL_TABLET | ORAL | Status: DC | PRN
  Administered 2022-07-21 – 2022-07-27 (×24): 5 mg via ORAL
  Filled 2022-07-21 (×24): qty 1

## 2022-07-21 MED ORDER — FENTANYL CITRATE PF 50 MCG/ML IJ SOSY: 12.5000 ug | PREFILLED_SYRINGE | Freq: Once | INTRAMUSCULAR | Status: AC

## 2022-07-21 MED ORDER — FENTANYL CITRATE PF 50 MCG/ML IJ SOSY
PREFILLED_SYRINGE | INTRAMUSCULAR | Status: AC
  Administered 2022-07-21: 12.5 ug via INTRAVENOUS
  Filled 2022-07-21: qty 1

## 2022-07-21 MED ORDER — LEVETIRACETAM 500 MG PO TABS
500.0000 mg | ORAL_TABLET | Freq: Two times a day (BID) | ORAL | Status: DC
  Administered 2022-07-21 – 2022-07-22 (×2): 500 mg via ORAL
  Filled 2022-07-21 (×2): qty 1

## 2022-07-21 MED ORDER — OXYCODONE HCL 5 MG PO TABS
5.0000 mg | ORAL_TABLET | ORAL | Status: DC | PRN
  Administered 2022-07-21: 5 mg via ORAL
  Filled 2022-07-21: qty 1

## 2022-07-21 MED ORDER — TRAMADOL HCL 50 MG PO TABS: 50.0000 mg | ORAL_TABLET | Freq: Four times a day (QID) | ORAL | Status: DC | PRN

## 2022-07-21 NOTE — Progress Notes (Signed)
Admit: 07/17/2022 LOS: 3  13F admit 11/18 with AKI, AMS, CKD3.    Subjective:  SCr lower over past 24h to 5.1, K 4.6, HCO3 20 Oliguric 0.64L UOP past 24h, increasing? Remains on IVFs LR @ 99m/h BPs stable, on RA Likely to require IR biopsy of pelvic mass  11/21 0701 - 11/22 0700 In: 1911 [I.V.:1567.8; IV Piggyback:343.2] Out: 650 [Urine:640; Blood:10]  Filed Weights   07/17/22 2252 07/18/22 0520 07/20/22 0500  Weight: 129.7 kg 134 kg (!) 142 kg    Scheduled Meds:  acetaminophen  500 mg Oral Q6H   Chlorhexidine Gluconate Cloth  6 each Topical Daily   mouth rinse  15 mL Mouth Rinse 4 times per day   polyethylene glycol  17 g Oral Daily   senna-docusate  2 tablet Oral BID   Continuous Infusions:   ceFAZolin (ANCEF) IV 100 mL/hr at 07/21/22 1100   lactated ringers Stopped (07/21/22 1055)   levETIRAcetam Stopped (07/21/22 0345)   PRN Meds:.acetaminophen **OR** acetaminophen, albuterol, bisacodyl, fentaNYL (SUBLIMAZE) injection, lip balm, naLOXone (NARCAN)  injection, mouth rinse, oxyCODONE  Current Labs: reviewed    Physical Exam:  Blood pressure 123/65, pulse 79, temperature 98.7 F (37.1 C), temperature source Oral, resp. rate 11, height '5\' 4"'$  (1.626 m), weight (!) 142 kg, SpO2 98 %. Obese, NAD, interactive RRR nl s1s2 CTAB S/nt, no ascites Trace LEE at most  Nonfocal AAO x3  A AKI on CKD3b  (BL SCr 1.5 to 1.8) CT Abd at admission without acute structural kidney issues ? ATN with preceding hypovolemia Cont LR today until stable PO intake Oliguric Perhaps recovering GFR now, tomorrow UOP and labs will be helpful No immediate HD needs  Pelvic mass / complex fluid collection: per GYN, MRI confirmed likely pelvic malignancy with carcinomatosis, for IR biopsies it appears AHRF with hypercapniea: intermittent bipap, stable currently Hyperkalemia, mild, improved and K < 5 UTI Cx+ for klebsiella on ceftriaxone per TRH Hx/o L breast cancer, some lympedema on LUE Hx/o  seizures HTN, BPs stable no meds  P Cont supportive care, IV hydration until adequate PO as above No immediate HD needs, will follow closely Medication Issues; Preferred narcotic agents for pain control are hydromorphone, fentanyl, and methadone. Morphine should not be used.  Baclofen should be avoided Avoid oral sodium phosphate and magnesium citrate based laxatives / bowel preps    RPearson GrippeMD 07/21/2022, 12:00 PM  Recent Labs  Lab 07/18/22 0440 07/19/22 0957 07/20/22 0530 07/21/22 0238  NA 137 139 136 139  K 5.2* 5.2* 4.7 4.6  CL 104 108 108 110  CO2 22 19* 18* 20*  GLUCOSE 101* 66* 102* 88  BUN 45* 44* 45* 43*  CREATININE 6.02* 5.96* 5.88* 5.09*  CALCIUM 8.8* 8.1* 8.0* 8.3*  PHOS 6.0*  --  5.3* 5.3*    Recent Labs  Lab 07/19/22 0957 07/20/22 0530 07/21/22 0238  WBC 8.5 7.3 8.8  NEUTROABS 6.5 5.4 6.7  HGB 8.7* 8.6* 9.9*  HCT 29.0* 28.2* 33.9*  MCV 96.3 94.0 99.4  PLT 218 211 210

## 2022-07-21 NOTE — Progress Notes (Signed)
Cbg at this time is 73, 4 ounces of apple juice given to patient at this time, and she finished it.

## 2022-07-21 NOTE — Progress Notes (Signed)
Progress Note   Patient: [name] MRN:[id number] DOB: 02/27/1956 DOA: 07/17/2022     3 DOS: the patient was seen and examined on 07/21/2022 at 11:25AM      Brief hospital course: PMH of CKD 3B 1.5-1.8, seizures, breast cancer, HTN, anemia presented to hospital with complaints of confusion and excessive sleepiness.  Found to have AKI Cr 6.3 on CKD 3B as well as acute hypercarbia with respiratory acidosis. Currently plan is to give IV hydration for AKI.  Nephrology consulted. BiPAP nightly. MRI brain negative.  EEG ordered.  Continues Keppra.  Hold gabapentin. Underwent biopsy of the adnexal mass on 11/21.     Assessment and Plan: Acute renal failure on CKD stage IIIb Urine output up to 640 in the last 24 hours, creatinine down to 5.1 with fluids.  This appears to be ischemic injury. - Continue IV fluids - Consult nephrology, appreciate cares   Acute metabolic and toxic encephalopathy At baseline patient is no cognitive impairment, in the hospital she has been somnolent, poorly responsive.  Suspect this is multifactorial from hypercarbia, renal failure, and gabapentin use. - Hold gabapentin - Avoid psychotropic medications - Treat AKI as above   Pelvic mass MRI shows 17 cm right adnexal mass.  Underwent biopsy in the OR 11/21, nondiagnostic. - Consult GYN oncology, appreciate recommendations - Consult IR for biopsy of peritoneal implant  Ascites Due to pelvic mass and abdominal implants   Generalized pain Chronic  Hypoglycemia Resolved  History of breast cancer Invasive ductal carcinoma, stage IIIa, in remission  Epilepsy -Continue home Keppra, transition to p.o.  Possible UTI -Continue cefazolin  Elevated free T4 -Repeat as an outpatient  Chronic diastolic CHF Body habitus precludes volume assessment.  Not on significant oxygen, presume she is close to euvolemic - Hold furosemide  Anemia of chronic disease Hemoglobin slightly up to 9.9, no clinical  bleeding  Hypertension Blood pressure controlled - Hold home amlodipine, furosemide  Morbid obesity BMI 53  Lymphedema, left upper extremity Chronic due to BrCA          Subjective: Patient has pain "all over," although when prompted to localize this to any place, she has difficulty.  No fever, no respiratory symptoms.  Oriented to self, hospital.     Physical Exam: BP 123/65   Pulse 79   Temp 98 F (36.7 C) (Oral)   Resp 11   Ht 5' 4" (1.626 m)   Wt (!) 142 kg   SpO2 98%   BMI 53.74 kg/m   Obese adult female, lying in bed, appears weak and debilitated RRR, heart sounds distant, no pitting peripheral edema, otherwise body habitus precludes volume assessment Respiratory effort appears tachypneic due to anxiety, I cannot appreciate rales or wheezing Abdomen without grimace or guarding, no distention, habitus again difficult to assess Attention seems diminished, she keeps her eyes closed, she responds to questions, but generally does not follow commands    Data Reviewed: Discussed with nephrology Basic metabolic panel shows improving creatinine CBC shows hemoglobin up to 9.9 Glucose normal   Family Communication: None present at the bedside    Disposition: Status is: Inpatient The patient was admitted with acute renal failure.  Her creatinine is improving slightly with IV fluids.  She will need quite a bit of ongoing hospital care until her kidney function resolves, I suspect that she will likely need SNF at the time of discharge although this is difficult to assess at this time.        Author:    Shirleen Schirmer, MD 07/21/2022 12:52 PM  For on call review [email or web address].

## 2022-07-21 NOTE — Consult Note (Signed)
Chief Complaint: Patient was seen in consultation today for pelvic mass at the request of Joylene John, NP  Referring Physician(s): Joylene John, NP  Supervising Physician: Arne Cleveland  Patient Status: [facility] - In-pt  History of Present Illness: [name] is a 66 y.o. female who presented to ED c/o AMS, fall and lethargy. Workup in the ED found AKI with creatinine 6 and CT AP concerning for pelvic fluid/hemorrhage versus underlying gyn malignancy. Pt was admitted and gyn/oncology was consulted. She underwent pelvic exam, cervical/endocervical and endometrial biopsies that resulted as non-diagnostic. Pt was referred to IR for peritoneal/lymph node biopsy. After reviewing imaging, Dr. Maryelizabeth Kaufmann recommends pelvic mass biopsy due to no good target for original requested biopsy sites. Care team was made aware and agree with recommendation to biopsy pelvic mass.  Procedure tentatively scheduled for 07/23/22.   Pt denies fever, chills, CP, SOB, N/V. She endorses abdominal/pelvic pain post-procedure and loss of appetite  Past Medical History:  Diagnosis Date   Alcohol dependence ([place])    Aortic atherosclerosis ([place])    Arthritis    Cancer ([place]) 11/2019   Left breast   Chronic heart failure (HCC)    Chronic kidney disease, stage 3 ([place])    COVID-19    Diverticulitis    Family history of ovarian cancer    Heart murmur    11/26/19 echo: Mild AS. AV mean gradient 12.0 mmHg; however, LVOT gradient 8 mmHg with intracvitary gradient-significant AS is not suspected   Hypertension    Right nephrolithiasis    Thoracic aortic aneurysm (HCC)    Tubular adenoma polyp of rectum     Past Surgical History:  Procedure Laterality Date   COLONOSCOPY WITH PROPOFOL N/A 02/24/2021   Procedure: COLONOSCOPY WITH PROPOFOL;  Surgeon: Yetta Flock, MD;  Location: [facility];  Service: Gastroenterology;  Laterality: N/A;   MASTECTOMY WITH AXILLARY LYMPH NODE DISSECTION Left 12/19/2019    Procedure: LEFT MASTECTOMY WITH AXILLARY LYMPH NODE DISSECTION;  Surgeon: [name], MD;  Location: [place];  Service: General;  Laterality: Left;   MULTIPLE EXTRACTIONS WITH ALVEOLOPLASTY Bilateral 12/14/2019   Procedure: MULTIPLE EXTRACTION WITH ALVEOLOPLASTY;  Surgeon: [name], DDS;  Location: [place];  Service: Oral Surgery;  Laterality: Bilateral;   POLYPECTOMY  02/24/2021   Procedure: POLYPECTOMY;  Surgeon: Yetta Flock, MD;  Location: [facility];  Service: Gastroenterology;;   Sol Passer PLACEMENT N/A 12/19/2019   Procedure: INSERTION PORT-A-CATH WITH ULTRASOUND GUIDANCE;  Surgeon: [name], MD;  Location: [place];  Service: General;  Laterality: N/A;   RADIAL HEAD ARTHROPLASTY Right 06/25/2019   Procedure: RIGHT RADIAL HEAD ARTHROPLASTY;  Surgeon: [name], MD;  Location: [place];  Service: Orthopedics;  Laterality: Right;   TUBAL LIGATION      Allergies: Amlodipine  Medications: Prior to Admission medications   Medication Sig Start Date End Date Taking? Authorizing Provider  acamprosate (CAMPRAL) 333 MG tablet Take 666 mg by mouth 3 (three) times daily with meals.   Yes [provider]  albuterol (VENTOLIN HFA) 108 (90 Base) MCG/ACT inhaler Inhale 2 puffs into the lungs every 6 (six) hours as needed for wheezing or shortness of breath. 09/12/21 09/12/22 Yes [name], [name], MD  amLODipine (NORVASC) 10 MG tablet Take 1 tablet (10 mg total) by mouth daily. Patient taking differently: Take 10 mg by mouth every morning. 09/12/21  Yes [name], [name], MD  BIOTIN PO Take 1 tablet by mouth daily.   Yes [provider]  folic acid (FOLVITE) 1 MG  tablet Take 1 tablet (1 mg total) by mouth daily. 06/30/22  Yes Regalado, Belkys A, MD  furosemide (LASIX) 20 MG tablet Take 1 tablet (20 mg total) by mouth daily. Please make overdue appt with Dr. Gasper Sells before anymore refills. Thank you 1st attempt Patient taking differently: Take 20 mg by mouth every  morning. 06/03/21  Yes Chandrasekhar, Mahesh A, MD  gabapentin (NEURONTIN) 300 MG capsule Take 1 capsule (300 mg total) by mouth at bedtime. 07/14/20  Yes Magrinat, Virgie Dad, MD  levETIRAcetam (KEPPRA) 500 MG tablet Take 1 tablet (500 mg total) by mouth 2 (two) times daily. 06/29/22  Yes Regalado, Belkys A, MD  Multiple Vitamin (MULTIVITAMIN WITH MINERALS) TABS tablet Take 1 tablet by mouth daily. 02/08/22  Yes Pahwani, Rinka R, MD  polyethylene glycol (MIRALAX / GLYCOLAX) 17 g packet Take 17 g by mouth daily. 06/30/22  Yes Regalado, Belkys A, MD  traMADol (ULTRAM) 50 MG tablet Take 50 mg by mouth 3 (three) times daily as needed (pain).   Yes [provider]  traZODone (DESYREL) 50 MG tablet Take 50 mg by mouth at bedtime. 01/18/22  Yes [provider]  carvedilol (COREG) 12.5 MG tablet Take 12.5 mg by mouth 2 (two) times daily.    [provider]     Family History  Problem Relation Age of Onset   Hypertension Mother    Dementia Mother    Ovarian cancer Half-Sister 59   Diabetes Half-Sister    Stroke Half-Sister     Social History   Socioeconomic History   Marital status: Legally Separated    Spouse name: Not on file   Number of children: Not on file   Years of education: Not on file   Highest education level: Not on file  Occupational History   Not on file  Tobacco Use   Smoking status: Never   Smokeless tobacco: Never  Vaping Use   Vaping Use: Never used  Substance and Sexual Activity   Alcohol use: Yes    Alcohol/week: 3.0 standard drinks of alcohol    Types: 1 Cans of beer, 2 Shots of liquor per week    Comment: daily   Drug use: Yes    Types: Marijuana   Sexual activity: Not Currently  Other Topics Concern   Not on file  Social History Narrative   Grand daughter stays with her   Mrs Faux cares for her 37 developmental delayed daughter [name] with mental health issues at home - Previous was at a group home   Social Determinants of Health    Financial Resource Strain: Low Risk  (07/04/2020)   Overall Financial Resource Strain (CARDIA)    Difficulty of Paying Living Expenses: Not hard at all  Food Insecurity: No Food Insecurity (07/18/2022)   Hunger Vital Sign    Worried About Running Out of Food in the Last Year: Never true    [place] in the Last Year: Never true  Transportation Needs: No Transportation Needs (07/18/2022)   PRAPARE - Hydrologist (Medical): No    Lack of Transportation (Non-Medical): No  Physical Activity: Not on file  Stress: Stress Concern Present (12/10/2021)   Bent    Feeling of Stress : Rather much  Social Connections: Moderately Integrated (12/10/2021)   Social Connection and Isolation Panel [NHANES]    Frequency of Communication with Friends and Family: More than three times a week  Frequency of Social Gatherings with Friends and Family: More than three times a week    Attends Religious Services: 1 to 4 times per year    Active Member of Clubs or Organizations: Yes    Attends Archivist Meetings: 1 to 4 times per year    Marital Status: Separated      Review of Systems: A 12 point ROS discussed and pertinent positives are indicated in the HPI above.  All other systems are negative.  Review of Systems  Constitutional:  Positive for appetite change. Negative for chills and fever.  Respiratory:  Negative for shortness of breath.   Cardiovascular:  Negative for chest pain and leg swelling.  Gastrointestinal:  Positive for abdominal pain. Negative for nausea and vomiting.  Genitourinary:  Positive for pelvic pain.  Neurological:  Negative for dizziness, weakness and headaches.    Vital Signs: BP 131/79   Pulse 86   Temp 98 F (36.7 C) (Oral)   Resp 12   Ht '5\' 4"'$  (1.626 m)   Wt (!) 313 lb 0.9 oz (142 kg)   SpO2 97%   BMI 53.74 kg/m     Physical Exam Vitals  reviewed.  Constitutional:      General: She is not in acute distress.    Appearance: Normal appearance. She is ill-appearing.  HENT:     Head: Normocephalic and atraumatic.     Mouth/Throat:     Mouth: Mucous membranes are dry.     Pharynx: Oropharynx is clear.  Eyes:     Extraocular Movements: Extraocular movements intact.     Pupils: Pupils are equal, round, and reactive to light.  Cardiovascular:     Rate and Rhythm: Normal rate and regular rhythm.     Pulses: Normal pulses.     Heart sounds: Normal heart sounds. No murmur heard. Pulmonary:     Effort: Pulmonary effort is normal. No respiratory distress.     Breath sounds: Normal breath sounds.  Abdominal:     General: Bowel sounds are normal. There is no distension.     Palpations: Abdomen is soft.     Tenderness: There is abdominal tenderness. There is guarding.  Musculoskeletal:     Right lower leg: No edema.     Left lower leg: No edema.  Skin:    General: Skin is warm and dry.  Neurological:     Mental Status: She is alert and oriented to person, place, and time.  Psychiatric:        Mood and Affect: Mood normal.        Behavior: Behavior normal.        Thought Content: Thought content normal.        Judgment: Judgment normal.     Imaging: MR PELVIS WO CONTRAST  Result Date: 07/20/2022 CLINICAL DATA:  Evaluate adnexal mass. EXAM: MRI PELVIS WITHOUT CONTRAST TECHNIQUE: Multiplanar multisequence MR imaging of the pelvis was performed. No intravenous contrast was administered. COMPARISON:  CT AP 07/18/2022 FINDINGS: Urinary Tract: The urinary bladder is decompressed around a Foley catheter balloon. Bowel:  No dilated bowel loops identified. Vascular/Lymphatic: Normal caliber of the visualized abdominal aorta. No vascular abnormality. Multiple enlarged pelvic lymph nodes are identified, including: Right common iliac node measures 1.4 cm, image 8/17. Left common iliac lymph node measures 1.6 cm, image 29/17. Right  external iliac lymph node measures 1.7 cm, image 29/17. Left inguinal lymph node measures 1.1 cm, image 37/17. Reproductive: Uterus: Measures 9.3 by 4.8 by 5.8 cm (  volume = 140 cm^3) no discrete mass identified. Endometrium: The endometrium is diffusely thickened measuring up to 3.4 cm, image 24/9. The endometrium exhibits mild heterogeneous increased T2 signal and mild heterogeneous increased T1 signal. Right ovary: Not confidently identified. Left ovary: Not confidently identified. Other: Large mass within the pelvis is centered around the right adnexal region, crosses the midline and partially encases the uterus. This measures 17.4 by 12.3 cm, image 21/17. Within the left lower quadrant there is a mass which measures 11.2 x 4.7 cm, image 26/17. Other: Moderate abdominopelvic ascites. Peritoneal nodularity is identified concerning for carcinomatosis. Index nodule within the ventral pelvis measures 1.9 x 1.4 cm, image 25/17. Within the left pelvis there is a peritoneal nodule measuring 3.7 x 2.5 cm, image 30/17. Musculoskeletal: No suspicious bone lesions identified. IMPRESSION: 1. Large mass measuring up to within the pelvis measures up to 17 cm, is centered around the right adnexal region, crosses the midline and partially encases the uterus. A second mass is noted within the left lower quadrant which measures up to 11.2 cm. Imaging findings are highly concerning for malignancy either ovarian or endometrium in origin. 2. There is marked thickening of the endometrium which measures up to 3.4 cm. Cannot rule out under lying endometrial neoplasm. 3. Moderate abdominopelvic ascites. 4. Peritoneal nodularity is identified concerning for carcinomatosis. 5. Multiple enlarged pelvic lymph nodes are identified compatible with metastatic adenopathy. Electronically Signed   By: Kerby Moors M.D.   On: 07/20/2022 10:05   EEG adult  Result Date: 07/19/2022 Lora Havens, MD     07/19/2022  1:40 PM Patient Name:  Viridiana Spaid MRN: [id number] Epilepsy Attending: Lora Havens Referring Physician/Provider: [name], MD Date: 07/19/2022 Duration: 22.43 mins Patient history: 66 year old female with altered mental status.  EEG to evaluate for seizure. Level of alertness: Awake AEDs during EEG study: LEV Technical aspects: This EEG study was done with scalp electrodes positioned according to the 10-20 International system of electrode placement. Electrical activity was reviewed with band pass filter of 1-'70Hz'$ , sensitivity of 7 uV/mm, display speed of 95m/sec with a '60Hz'$  notched filter applied as appropriate. EEG data were recorded continuously and digitally stored.  Video monitoring was available and reviewed as appropriate. Description: No clear posterior dominant rhythm was seen. EEG showed continuous generalized predominantly 5 to 7 Hz theta slowing admixed with intermittent generalized 2 to 3 Hz delta slowing, at times with triphasic morphology.  Hyperventilation and photic stimulation were not performed.   ABNORMALITY - Continuous slow, generalized IMPRESSION: This study is suggestive of moderate diffuse encephalopathy, nonspecific etiology. No seizures or definite epileptiform discharges were seen throughout the recording. PLora Havens  MR BRAIN WO CONTRAST  Result Date: 07/18/2022 CLINICAL DATA:  Neuro deficit with stroke suspected EXAM: MRI HEAD WITHOUT CONTRAST TECHNIQUE: Multiplanar, multiecho pulse sequences of the brain and surrounding structures were obtained without intravenous contrast. COMPARISON:  Head CT from earlier today FINDINGS: Brain: Remote bilateral occipital cortex infarcts with cortical laminar necrosis and mineralization seen on T1 weighted imaging. No acute or subacute infarct, hemorrhage, hydrocephalus, mass, or collection. Vascular: Major flow voids are preserved. Skull and upper cervical spine: Normal marrow signal. Sinuses/Orbits: Negative. Other: Progressively motion degraded  study. IMPRESSION: 1. No acute finding. 2. Remote occipital cortex infarct. Electronically Signed   By: JJorje GuildM.D.   On: 07/18/2022 11:58   [place] COMPLETE WITH TRANSVAGINAL  Result Date: 07/18/2022 CLINICAL DATA:  66year old female inpatient with unenhanced CT findings worrisome  for gyn malignancy and peritoneal carcinomatosis. EXAM: TRANSABDOMINAL AND TRANSVAGINAL ULTRASOUND OF PELVIS TECHNIQUE: Both transabdominal and transvaginal ultrasound examinations of the pelvis were performed. Transabdominal technique was performed for global imaging of the pelvis including uterus, ovaries, adnexal regions, and pelvic cul-de-sac. It was necessary to proceed with endovaginal exam following the transabdominal exam to visualize the endometrium and adnexa. COMPARISON:  07/18/2022 CT abdomen/pelvis. FINDINGS: This inpatient ultrasound examination is essentially nondiagnostic due to patient related factors including morbid obesity, inability for the patient to move due to pain, and bowel gas. On the limited transabdominal images (in correlation with the unenhanced CT images from earlier today), there is a very large infiltrative pelvic soft tissue mass involving/encasing the right greater than left adnexal regions and encasing and obscuring the uterus, very poorly delineated on this ultrasound, spanning up to 14.0 x 12.2 x 19.9 cm. The ovaries are not discretely visualized on either side. The uterus including the endometrium is completely indistinct on these images. Other findings There is small volume complex free fluid in the pelvic cul-de-sac. IMPRESSION: 1. Essentially nondiagnostic inpatient pelvic ultrasound examination due to patient related factors as detailed. 2. Suspected very large infiltrative pelvic soft tissue mass involving the right greater than left adnexal regions and encasing and obscuring the uterus, spanning up to 14.0 x 12.2 x 19.9 cm. Findings are very suspicious for a primary GYN  malignancy, with the differential including primary ovarian malignancy or primary endometrial malignancy. MRI pelvis without and with IV contrast recommended for further characterization. CT abdomen/pelvis with oral and IV contrast also recommended for further characterization. GYN-ONC consultation advised. 3. Small volume complex free fluid in the pelvic cul-de-sac. Electronically Signed   By: Ilona Sorrel M.D.   On: 07/18/2022 11:47   CT ABDOMEN PELVIS WO CONTRAST  Result Date: 07/18/2022 CLINICAL DATA:  Fall, abdominal pain, renal failure EXAM: CT ABDOMEN AND PELVIS WITHOUT CONTRAST TECHNIQUE: Multidetector CT imaging of the abdomen and pelvis was performed following the standard protocol without IV contrast. RADIATION DOSE REDUCTION: This exam was performed according to the departmental dose-optimization program which includes automated exposure control, adjustment of the mA and/or kV according to patient size and/or use of iterative reconstruction technique. COMPARISON:  CT pelvis dated 06/10/2021. CT abdomen/pelvis dated 03/07/2020. FINDINGS: Motion degraded images. Lower chest: Lung bases are clear. Hepatobiliary: Unenhanced liver is unremarkable. Gallbladder is unremarkable. No intrahepatic or extrahepatic duct dilatation. Pancreas: Within normal limits. Spleen: Within normal limits. Adrenals/Urinary Tract: Adrenal glands are within normal limits. Small parenchymal calcification in the posterior left lower kidney (series 4/image 31). Right kidney is within normal limits. No renal calculi or hydronephrosis. Bladder is within normal limits. Stomach/Bowel: Stomach is within normal limits. Bowel is partially obscured but grossly unremarkable. No evidence of bowel obstruction. Appendix is not discretely visualized. Vascular/Lymphatic: No evidence of abdominal aortic aneurysm. Atherosclerotic calcifications of the abdominal aorta and branch vessels. No suspicious abdominopelvic lymphadenopathy. Reproductive:  Uterus and bilateral ovaries are obscured. There may be fluid within a dilated central uterine cavity (sagittal image 103), but this is poorly evaluated on the current study. Other: Mild perihepatic and perisplenic ascites, simple. Moderate pelvic fluid/hemorrhage (series 4/image 58), masslike, with possible omental nodularity (series 4/image 49). Musculoskeletal: Degenerative changes of the visualized thoracolumbar spine. IMPRESSION: Motion degraded images. Moderate complex pelvic fluid/hemorrhage, as above. Hemorrhage related to trauma is possible but not favored. Rather, the appearance is worrisome for underlying (GYN) malignancy, although obscured on this noncontrast evaluation. Consider CT abdomen/pelvis with oral/IV contrast for further evaluation,  if clinically able. Moderate Electronically Signed   By: Julian Hy M.D.   On: 07/18/2022 01:06   CT Head Wo Contrast  Result Date: 07/18/2022 CLINICAL DATA:  Memory loss, lethargy, recent falls. ETOH withdrawal EXAM: CT HEAD WITHOUT CONTRAST CT CERVICAL SPINE WITHOUT CONTRAST TECHNIQUE: Multidetector CT imaging of the head and cervical spine was performed following the standard protocol without intravenous contrast. Multiplanar CT image reconstructions of the cervical spine were also generated. RADIATION DOSE REDUCTION: This exam was performed according to the departmental dose-optimization program which includes automated exposure control, adjustment of the mA and/or kV according to patient size and/or use of iterative reconstruction technique. COMPARISON:  MRI brain dated 06/27/2022. FINDINGS: CT HEAD FINDINGS Brain: No evidence of acute infarction, hemorrhage, hydrocephalus, extra-axial collection or mass lesion/mass effect. Bilateral PCA distribution infarcts. Mild subcortical white matter and periventricular small vessel ischemic changes. Vascular: Intracranial atherosclerosis. Skull: Normal. Negative for fracture or focal lesion. Sinuses/Orbits:  The visualized paranasal sinuses are essentially clear. The mastoid air cells are unopacified. Other: None. CT CERVICAL SPINE FINDINGS Alignment: Straightening of the cervical spine, likely positional. Skull base and vertebrae: No acute fracture. No primary bone lesion or focal pathologic process. Benign bone island at C7. Soft tissues and spinal canal: No prevertebral fluid or swelling. No visible canal hematoma. Disc levels:  Mild degenerative changes of the mid cervical spine. Upper chest: Visualized lung apices are clear. Other: Visualized thyroid is unremarkable. IMPRESSION: No evidence of acute intracranial abnormality. Bilateral PCA distribution infarcts. Mild small vessel ischemic changes. No traumatic injury to the cervical spine. Mild degenerative changes. Electronically Signed   By: Julian Hy M.D.   On: 07/18/2022 00:46   CT Cervical Spine Wo Contrast  Result Date: 07/18/2022 CLINICAL DATA:  Memory loss, lethargy, recent falls. ETOH withdrawal EXAM: CT HEAD WITHOUT CONTRAST CT CERVICAL SPINE WITHOUT CONTRAST TECHNIQUE: Multidetector CT imaging of the head and cervical spine was performed following the standard protocol without intravenous contrast. Multiplanar CT image reconstructions of the cervical spine were also generated. RADIATION DOSE REDUCTION: This exam was performed according to the departmental dose-optimization program which includes automated exposure control, adjustment of the mA and/or kV according to patient size and/or use of iterative reconstruction technique. COMPARISON:  MRI brain dated 06/27/2022. FINDINGS: CT HEAD FINDINGS Brain: No evidence of acute infarction, hemorrhage, hydrocephalus, extra-axial collection or mass lesion/mass effect. Bilateral PCA distribution infarcts. Mild subcortical white matter and periventricular small vessel ischemic changes. Vascular: Intracranial atherosclerosis. Skull: Normal. Negative for fracture or focal lesion. Sinuses/Orbits: The  visualized paranasal sinuses are essentially clear. The mastoid air cells are unopacified. Other: None. CT CERVICAL SPINE FINDINGS Alignment: Straightening of the cervical spine, likely positional. Skull base and vertebrae: No acute fracture. No primary bone lesion or focal pathologic process. Benign bone island at C7. Soft tissues and spinal canal: No prevertebral fluid or swelling. No visible canal hematoma. Disc levels:  Mild degenerative changes of the mid cervical spine. Upper chest: Visualized lung apices are clear. Other: Visualized thyroid is unremarkable. IMPRESSION: No evidence of acute intracranial abnormality. Bilateral PCA distribution infarcts. Mild small vessel ischemic changes. No traumatic injury to the cervical spine. Mild degenerative changes. Electronically Signed   By: Julian Hy M.D.   On: 07/18/2022 00:46   DG Knee Complete 4 Views Right  Result Date: 07/18/2022 CLINICAL DATA:  Altered mental status.  Pain. EXAM: RIGHT KNEE - COMPLETE 4+ VIEW COMPARISON:  None Available. FINDINGS: Examination is limited secondary to body habitus and technique. There is  no definite acute fracture or dislocation. There is severe medial compartment joint space narrowing with osteophyte formation. There is mild patellofemoral joint space narrowing with osteophyte formation. Soft tissues are grossly within normal limits. IMPRESSION: 1. No definite acute fracture or dislocation. 2. 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 Electronically Signed   By: Ronney Asters M.D.   On: 07/18/2022 00:12   DG Chest Port 1 View  Result Date: 07/18/2022 CLINICAL DATA:  Hypoxia EXAM: PORTABLE CHEST 1 VIEW COMPARISON:  06/23/2022 FINDINGS: Cardiomegaly. Lungs clear. No effusions or edema. No acute bony abnormality. Right chest wall Port-A-Cath remains in place with the tip at the cavoatrial junction, unchanged. IMPRESSION: Cardiomegaly.  No active disease. Electronically Signed    By: Rolm Baptise M.D.   On: 07/18/2022 00:11   DG Ankle Complete Right  Result Date: 07/18/2022 CLINICAL DATA:  Altered mental status, pain, recent falls EXAM: RIGHT ANKLE - COMPLETE 3+ VIEW COMPARISON:  None Available. FINDINGS: No acute bony abnormality. Specifically, no fracture, subluxation, or dislocation. Soft tissues intact. IMPRESSION: No acute bony abnormality. Electronically Signed   By: Rolm Baptise M.D.   On: 07/18/2022 00:10   DG Hip Unilat W or Wo Pelvis 2-3 Views Right  Result Date: 07/18/2022 CLINICAL DATA:  Altered mental status.  Pain. EXAM: DG HIP (WITH OR WITHOUT PELVIS) 2-3V RIGHT COMPARISON:  None Available. FINDINGS: There is no evidence of hip fracture or dislocation. There is no evidence of arthropathy or other focal bone abnormality. IMPRESSION: Negative. Electronically Signed   By: Ronney Asters M.D.   On: 07/18/2022 00:08   VAS [place] UPPER EXTREMITY VENOUS DUPLEX  Result Date: 06/27/2022 UPPER VENOUS STUDY  Patient Name:  NELLE SAYED  Date of Exam:   06/26/2022 Medical Rec #: [id number]      Accession #:    [id number] Date of Birth: 06/13/1956     Patient Gender: F Patient Age:   51 years Exam Location:  [facility] Procedure:      VAS [place] UPPER EXTREMITY VENOUS DUPLEX Referring Phys: Su Monks --------------------------------------------------------------------------------  Indications: Swelling, and Edema Risk Factors: PMH Breast cancer. Limitations: Body habitus and Morbid obesity. Performing Technologist: Oda Cogan RDMS, RVT  Examination Guidelines: A complete evaluation includes B-mode imaging, spectral Doppler, color Doppler, and power Doppler as needed of all accessible portions of each vessel. Bilateral testing is considered an integral part of a complete examination. Limited examinations for reoccurring indications may be performed as noted.  Right Findings: +----------+------------+---------+-----------+----------+----------------+ RIGHT      CompressiblePhasicitySpontaneousProperties    Summary      +----------+------------+---------+-----------+----------+----------------+ Subclavian                                          obscured by tape +----------+------------+---------+-----------+----------+----------------+  Left Findings: +----------+------------+---------+-----------+----------+-------+ LEFT      CompressiblePhasicitySpontaneousPropertiesSummary +----------+------------+---------+-----------+----------+-------+ IJV           Full       Yes       Yes                      +----------+------------+---------+-----------+----------+-------+ Subclavian               Yes       Yes                      +----------+------------+---------+-----------+----------+-------+ Axillary  Full       Yes       Yes                      +----------+------------+---------+-----------+----------+-------+ Brachial      Full                                          +----------+------------+---------+-----------+----------+-------+ Radial        Full                                          +----------+------------+---------+-----------+----------+-------+ Ulnar         Full                                          +----------+------------+---------+-----------+----------+-------+ Cephalic      Full                                          +----------+------------+---------+-----------+----------+-------+ Basilic       Full                                          +----------+------------+---------+-----------+----------+-------+  Summary:  Left: No evidence of deep vein thrombosis in the upper extremity. No evidence of superficial vein thrombosis in the upper extremity.  *See table(s) above for measurements and observations.  Diagnosing physician: Jamelle Haring Electronically signed by Jamelle Haring on 06/27/2022 at 2:45:10 PM.    Final    MR BRAIN WO CONTRAST  Result Date:  06/27/2022 CLINICAL DATA:  Seizure disorder. Clinical change. Patient had 2 seizures. EXAM: MRI HEAD WITHOUT CONTRAST TECHNIQUE: Multiplanar, multiecho pulse sequences of the brain and surrounding structures were obtained without intravenous contrast. COMPARISON:  CT head without contrast 06/04/2022 MR head without contrast 02/06/2022 FINDINGS: Brain: Chronic encephalomalacia of the occipital poles bilaterally is stable. No acute infarct, hemorrhage, or mass lesion is present. No other significant white matter disease is present. The ventricles are of normal size. No significant extraaxial fluid collection is present. Deep brain nuclei are within normal limits. The internal auditory canals are within normal limits. The brainstem and cerebellum are within normal limits. Dedicated imaging of the temporal lobes demonstrates symmetric size and signal of the hippocampal structures. Vascular: Flow is present in the major intracranial arteries. Skull and upper cervical spine: The craniocervical junction is normal. Upper cervical spine is within normal limits. Marrow signal is unremarkable. Sinuses/Orbits: The paranasal sinuses and mastoid air cells are clear. The globes and orbits are within normal limits. IMPRESSION: 1. Stable chronic encephalomalacia of the occipital poles bilaterally. 2. No acute intracranial abnormality or significant interval change. Electronically Signed   By: San Morelle M.D.   On: 06/27/2022 13:01   Overnight EEG with video  Result Date: 06/25/2022 Lora Havens, MD     06/26/2022  9:00 AM Patient Name: Chioma Mukherjee MRN: [id number] Epilepsy Attending: Lora Havens Referring Physician/Provider: Vashti Hey, MD Duration: 06/24/2022 1502 to 06/25/2022 1502  Patient history: 66 y.o. female with PMH significant for breast cancer s/p mastectomy, hx of CKD 3, HTN, EtOh use with withdrawal seizures, morbid obesity, chronic diastolic CHF who has postictal confusion and  agitation after she had 2 seizures and was given 5 mg of Versed by EMS yesterday.  EEG to evaluate for seizure. Level of alertness: lethargic AEDs during EEG study: LEV Technical aspects: This EEG study was done with scalp electrodes positioned according to the 10-20 International system of electrode placement. Electrical activity was reviewed with band pass filter of 1-'70Hz'$ , sensitivity of 7 uV/mm, display speed of 20m/sec with a '60Hz'$  notched filter applied as appropriate. EEG data were recorded continuously and digitally stored.  Video monitoring was available and reviewed as appropriate. Description: At the beginning of the study, no clear posterior dominant rhythm was seen.  EEG showed continuous generalized 3-5 Hz theta-delta slowing admixed with generalized periodic discharges with triphasic morphology at 1 to 1.5 Hz, more prominent when awake/stimulated.  Gradually, EEG improved and showed posterior dominant rhythm of 8-9 Hz activity of moderate voltage (25-35 uV) seen predominantly in posterior head regions, symmetric and reactive to eye opening and eye closing. Sleep was characterized by vertex waves, sleep spindles (12 to 14 Hz), maximal frontocentral region.  EEG also showed intermittent generalized rhythmic 2 to 3 Hz delta slowing with triphasic morphology. Hyperventilation and photic stimulation were not performed.   ABNORMALITY - Periodic discharges with triphasic morphology, generalized ( GPDs) - Continuous slow, generalized IMPRESSION: This study showed generalized periodic discharges with triphasic morphology which can be on the ictal- interictal continuum.  However the morphology, frequency and reactivity to stimulation is more commonly indicative of toxic-metabolic causes.  Additionally there is moderate diffuse encephalopathy, nonspecific etiology.  No seizures were seen during the study. PScraper  CT HEAD WO CONTRAST (5MM)  Result Date: 06/24/2022 CLINICAL DATA:  Seizure, altered  mental status EXAM: CT HEAD WITHOUT CONTRAST TECHNIQUE: Contiguous axial images were obtained from the base of the skull through the vertex without intravenous contrast. RADIATION DOSE REDUCTION: This exam was performed according to the departmental dose-optimization program which includes automated exposure control, adjustment of the mA and/or kV according to patient size and/or use of iterative reconstruction technique. COMPARISON:  04/04/2022 03/2022 FINDINGS: Brain: No evidence of acute infarction, hemorrhage, hydrocephalus, extra-axial collection or mass lesion/mass effect. Unchanged encephalomalacia of the bilateral occipital lobes (series 4, image 14). Vascular: No hyperdense vessel or unexpected calcification. Skull: Normal. Negative for fracture or focal lesion. Sinuses/Orbits: No acute finding. Other: None. IMPRESSION: 1.  No acute intracranial pathology. 2.  Unchanged encephalomalacia of the bilateral occipital lobes. Electronically Signed   By: ADelanna AhmadiM.D.   On: 06/24/2022 08:23   DG Chest 1 View  Result Date: 06/23/2022 CLINICAL DATA:  Seizures, hypoxia. EXAM: CHEST  1 VIEW COMPARISON:  Chest radiograph dated April 04, 2022. FINDINGS: The heart is enlarged. Right IJ access MediPort with distal tip in the SVC. Low lung volumes with bibasilar atelectasis. No large pleural effusion or pneumothorax. Multiple surgical clips overlying the left axilla. No acute osseous abnormality. IMPRESSION: 1. Low lung volumes with bibasilar atelectasis. 2. Cardiomegaly. 3. Right IJ access MediPort with distal tip in the SVC. Electronically Signed   By: IKeane PoliceD.O.   On: 06/23/2022 22:41    Labs:  CBC: Recent Labs    07/18/22 0440 07/19/22 0957 07/20/22 0530 07/21/22 0238  WBC 8.6 8.5 7.3 8.8  HGB 9.3* 8.7* 8.6* 9.9*  HCT  30.7* 29.0* 28.2* 33.9*  PLT 227 218 211 210    COAGS: Recent Labs    09/01/21 0351 02/04/22 1403 04/04/22 2206 07/18/22 0440  INR 1.2 1.2 1.1 1.2  APTT  --   --   27  --     BMP: Recent Labs    07/18/22 0440 07/19/22 0957 07/20/22 0530 07/21/22 0238  NA 137 139 136 139  K 5.2* 5.2* 4.7 4.6  CL 104 108 108 110  CO2 22 19* 18* 20*  GLUCOSE 101* 66* 102* 88  BUN 45* 44* 45* 43*  CALCIUM 8.8* 8.1* 8.0* 8.3*  CREATININE 6.02* 5.96* 5.88* 5.09*  GFRNONAA 7* 7* 7* 9*    LIVER FUNCTION TESTS: Recent Labs    06/25/22 0830 06/26/22 0500 07/17/22 2322 07/18/22 0440 07/20/22 0530 07/21/22 0238  BILITOT 1.1 0.8 0.5 0.3  --   --   AST 15 13* 20 17  --   --   ALT '7 7 7 7  '$ --   --   ALKPHOS 39 36* 40 38  --   --   PROT 7.1 6.9 8.2* 7.5  --   --   ALBUMIN 3.0* 2.9* 3.3* 3.1* 2.4* 2.4*    TUMOR MARKERS: No results for input(s): "AFPTM", "CEA", "CA199", "CHROMGRNA" in the last 8760 hours.  Assessment and Plan: Patient with PMH of CKD stage III, seizures, breast cancer, HTN and anemia presents to IR for pelvic mass biopsy.  Pt resting in bed with family at bedside.  She is A&O, calm and pleasant.  Pt moans occasionally from abd/pelvic pain.  She is in no distress.  Pt aware that she will be NPO MN prior to procedure. Procedure tentatively scheduled for 07/23/22 with Dr. Vernard Gambles.   Risks and benefits of pelvic mass biopsy was discussed with the patient and/or patient's family including, but not limited to bleeding, infection, damage to adjacent structures or low yield requiring additional tests.  All of the questions were answered and there is agreement to proceed.  Consent signed and in chart.  Thank you for this interesting consult.  I greatly enjoyed meeting Marcia Hartwell and look forward to participating in their care.  A copy of this report was sent to the requesting provider on this date.  Electronically Signed: Tyson Alias, NP 07/21/2022, 1:42 PM   I spent a total of 20 minutes in face to face in clinical consultation, greater than 50% of which was counseling/coordinating care for pelvic mass.

## 2022-07-21 NOTE — Progress Notes (Addendum)
Request received for biopsy of peritoneal nodule or lymph node d/t undergoing cervical and endometrial biopsies that were non-diagnostic.  Dr. Maryelizabeth Kaufmann reviewed imaging and approved pelvic mass biopsy due to no good peritoneal or lymph node targets.   Ordering team made aware of Dr. Patricia Nettle recommendation.     Narda Rutherford, AGNP-BC 07/21/2022, 1:22 PM

## 2022-07-21 NOTE — Progress Notes (Addendum)
Gynecologic Oncology Progress Note  Patient laying in bed moaning stating she has pain all over and feels sensitive to touch. Grabbing lower abdomen as well. No abnormal vaginal bleeding per RN. RN to reach out to primary team. Will order a kpad as tolerated. Awaiting results of biopsies taken on 07/20/2022 with Dr. Ernestina Patches. Given findings of metastatic disease on MR, recommendation for chest imaging (CT of chest, non-contrast) to rule out thoracic metastasis.

## 2022-07-21 NOTE — Telephone Encounter (Signed)
Called pt's son to review her biopsy results. Discussed that overall these results were benign and do not provide a diagnosis for what is going on in her pelvis. Reviewed th MRI findings as well including the complex masses, ascites, concern for peritoneal implants and enlarged lymph nodes. Discussed that this does give me concern for ovarian cancer, particularly given that the other biopsies did not explain her findings. Would recommend an IR biopsy as the next step for diagnosis. Briefly discussed that typically with ovarian cancer, treatment would include chemotherapy. However, given her current clinical state, she would not be a candidate for chemo and in this scenario chemo could be more harmful and shorten her life. So for now, we will try to obtain a diagnosis, hopefully see her continue to medically improve and get out of the hospital. Then, once we have a diagnosis, we can reassess treatment options pending patient's clinical status.   Also discussed with son that I note that the patient has a DNR in the chart from 02/2020. Patient is not sure if that is still the patient's wishes (currently listed as full code from recent admission. He is going to try to speak with his Aunt to see if she is aware of the patient's wishes. He is not aware of an active advanced directive or living will. He reports that he does have a sister, but she is mentally disabled and he helps care for her, and so he is the default next of kin to the patient for medical decision making.

## 2022-07-22 DIAGNOSIS — N3000 Acute cystitis without hematuria: Secondary | ICD-10-CM

## 2022-07-22 DIAGNOSIS — R52 Pain, unspecified: Secondary | ICD-10-CM | POA: Insufficient documentation

## 2022-07-22 DIAGNOSIS — N1831 Chronic kidney disease, stage 3a: Secondary | ICD-10-CM | POA: Diagnosis not present

## 2022-07-22 DIAGNOSIS — R946 Abnormal results of thyroid function studies: Secondary | ICD-10-CM | POA: Insufficient documentation

## 2022-07-22 DIAGNOSIS — N1832 Chronic kidney disease, stage 3b: Secondary | ICD-10-CM | POA: Diagnosis not present

## 2022-07-22 DIAGNOSIS — N179 Acute kidney failure, unspecified: Secondary | ICD-10-CM | POA: Diagnosis not present

## 2022-07-22 DIAGNOSIS — D631 Anemia in chronic kidney disease: Secondary | ICD-10-CM

## 2022-07-22 DIAGNOSIS — R1900 Intra-abdominal and pelvic swelling, mass and lump, unspecified site: Secondary | ICD-10-CM | POA: Diagnosis not present

## 2022-07-22 DIAGNOSIS — N859 Noninflammatory disorder of uterus, unspecified: Secondary | ICD-10-CM | POA: Diagnosis not present

## 2022-07-22 DIAGNOSIS — E162 Hypoglycemia, unspecified: Secondary | ICD-10-CM

## 2022-07-22 DIAGNOSIS — I890 Lymphedema, not elsewhere classified: Secondary | ICD-10-CM

## 2022-07-22 LAB — GLUCOSE, CAPILLARY
Glucose-Capillary: 66 mg/dL — ABNORMAL LOW (ref 70–99)
Glucose-Capillary: 76 mg/dL (ref 70–99)
Glucose-Capillary: 79 mg/dL (ref 70–99)
Glucose-Capillary: 79 mg/dL (ref 70–99)
Glucose-Capillary: 81 mg/dL (ref 70–99)
Glucose-Capillary: 89 mg/dL (ref 70–99)

## 2022-07-22 LAB — CBC
HCT: 35.1 % — ABNORMAL LOW (ref 36.0–46.0)
Hemoglobin: 10.9 g/dL — ABNORMAL LOW (ref 12.0–15.0)
MCH: 29.7 pg (ref 26.0–34.0)
MCHC: 31.1 g/dL (ref 30.0–36.0)
MCV: 95.6 fL (ref 80.0–100.0)
Platelets: 260 10*3/uL (ref 150–400)
RBC: 3.67 MIL/uL — ABNORMAL LOW (ref 3.87–5.11)
RDW: 14.8 % (ref 11.5–15.5)
WBC: 9 10*3/uL (ref 4.0–10.5)
nRBC: 0 % (ref 0.0–0.2)

## 2022-07-22 LAB — RENAL FUNCTION PANEL
Albumin: 2.7 g/dL — ABNORMAL LOW (ref 3.5–5.0)
Anion gap: 8 (ref 5–15)
BUN: 43 mg/dL — ABNORMAL HIGH (ref 8–23)
CO2: 20 mmol/L — ABNORMAL LOW (ref 22–32)
Calcium: 8.6 mg/dL — ABNORMAL LOW (ref 8.9–10.3)
Chloride: 110 mmol/L (ref 98–111)
Creatinine, Ser: 4.76 mg/dL — ABNORMAL HIGH (ref 0.44–1.00)
GFR, Estimated: 10 mL/min — ABNORMAL LOW (ref >60)
Glucose, Bld: 84 mg/dL (ref 70–99)
Phosphorus: 4.8 mg/dL — ABNORMAL HIGH (ref 2.5–4.6)
Potassium: 4.8 mmol/L (ref 3.5–5.1)
Sodium: 138 mmol/L (ref 135–145)

## 2022-07-22 MED ORDER — FENTANYL CITRATE PF 50 MCG/ML IJ SOSY
12.5000 ug | PREFILLED_SYRINGE | INTRAMUSCULAR | Status: AC
  Administered 2022-07-22: 12.5 ug via INTRAVENOUS

## 2022-07-22 MED ORDER — DEXTROSE IN LACTATED RINGERS 5 % IV SOLN: INTRAVENOUS | Status: DC

## 2022-07-22 MED ORDER — DIVALPROEX SODIUM 125 MG PO CSDR
250.0000 mg | DELAYED_RELEASE_CAPSULE | Freq: Two times a day (BID) | ORAL | Status: DC
  Administered 2022-07-22 – 2022-08-03 (×25): 250 mg via ORAL
  Filled 2022-07-22 (×26): qty 2

## 2022-07-22 MED ORDER — LEVETIRACETAM 250 MG PO TABS
250.0000 mg | ORAL_TABLET | Freq: Two times a day (BID) | ORAL | Status: AC
  Administered 2022-07-23 – 2022-07-24 (×3): 250 mg via ORAL
  Filled 2022-07-22 (×3): qty 1

## 2022-07-22 NOTE — Plan of Care (Signed)
  Problem: Education: Goal: Knowledge of General Education information will improve Description Including pain rating scale, medication(s)/side effects and non-pharmacologic comfort measures Outcome: Progressing   Problem: Health Behavior/Discharge Planning: Goal: Ability to manage health-related needs will improve Outcome: Progressing   

## 2022-07-22 NOTE — Assessment & Plan Note (Addendum)
Urine culture significant for Klebsiella pneumoniae. Completed antibiotic therapy.

## 2022-07-22 NOTE — Progress Notes (Signed)
GYN Oncology Progress Note  Plan for: 2 Days Post-Op Procedure(s) (LRB): EXAM UNDER ANESTHESIA, CERVICAL BIOPSY, ENDOMETRIAL BIOPSY (N/A)  Subjective: Patient resting in bed. Opens eyes to stimuli and her name but then doses back off. Not communicative with me this morning. According to her nurse, she reports that the patient had some pain overnight. Otherwise no vaginal bleeding and had a bowel movement this morning.    Objective: Vital signs in last 24 hours: Temp:  [97.5 F (36.4 C)-98.5 F (36.9 C)] 98.2 F (36.8 C) (11/23 1311) Pulse Rate:  [84-95] 95 (11/23 1311) Resp:  [15-20] 19 (11/23 1311) BP: (112-131)/(60-93) 127/82 (11/23 1311) SpO2:  [95 %-100 %] 100 % (11/23 1311) FiO2 (%):  [21 %] 21 % (11/22 2017) Weight:  [308 lb 13.8 oz (140.1 kg)] 308 lb 13.8 oz (140.1 kg) (11/23 0304) Last BM Date : 07/22/22  Intake/Output from previous day: 11/22 0701 - 11/23 0700 In: 1239.2 [I.V.:1139.1; IV Piggyback:100] Out: 325 [Urine:325]  Physical Examination: Abdominal exam: no focal tenderness to palpation but apparent mild diffuse tenderness based on grimace   Labs: WBC/Hgb/Hct/Plts:  9.0/10.9/35.1/260 (11/23 0529) BUN/Cr/glu/ALT/AST/amyl/lip:  43/4.76/--/--/--/--/-- (11/23 0529)  CA 125: 179 CA 27-29: <9.0  MR PELVIS WO CONTRAST 07/19/2022  Narrative CLINICAL DATA:  Evaluate adnexal mass.  EXAM: MRI PELVIS WITHOUT CONTRAST  TECHNIQUE: Multiplanar multisequence MR imaging of the pelvis was performed. No intravenous contrast was administered.  COMPARISON:  CT AP 07/18/2022  FINDINGS: Urinary Tract: The urinary bladder is decompressed around a Foley catheter balloon.  Bowel:  No dilated bowel loops identified.  Vascular/Lymphatic: Normal caliber of the visualized abdominal aorta. No vascular abnormality. Multiple enlarged pelvic lymph nodes are identified, including:  Right common iliac node measures 1.4 cm, image 8/17.  Left common iliac lymph node measures  1.6 cm, image 29/17.  Right external iliac lymph node measures 1.7 cm, image 29/17.  Left inguinal lymph node measures 1.1 cm, image 37/17.  Reproductive: Uterus: Measures 9.3 by 4.8 by 5.8 cm (volume = 140 cm^3) no discrete mass identified.  Endometrium: The endometrium is diffusely thickened measuring up to 3.4 cm, image 24/9. The endometrium exhibits mild heterogeneous increased T2 signal and mild heterogeneous increased T1 signal.  Right ovary: Not confidently identified.  Left ovary: Not confidently identified.  Other: Large mass within the pelvis is centered around the right adnexal region, crosses the midline and partially encases the uterus. This measures 17.4 by 12.3 cm, image 21/17.  Within the left lower quadrant there is a mass which measures 11.2 x 4.7 cm, image 26/17.  Other: Moderate abdominopelvic ascites. Peritoneal nodularity is identified concerning for carcinomatosis. Index nodule within the ventral pelvis measures 1.9 x 1.4 cm, image 25/17. Within the left pelvis there is a peritoneal nodule measuring 3.7 x 2.5 cm, image 30/17.  Musculoskeletal: No suspicious bone lesions identified.  IMPRESSION: 1. Large mass measuring up to within the pelvis measures up to 17 cm, is centered around the right adnexal region, crosses the midline and partially encases the uterus. A second mass is noted within the left lower quadrant which measures up to 11.2 cm. Imaging findings are highly concerning for malignancy either ovarian or endometrium in origin. 2. There is marked thickening of the endometrium which measures up to 3.4 cm. Cannot rule out under lying endometrial neoplasm. 3. Moderate abdominopelvic ascites. 4. Peritoneal nodularity is identified concerning for carcinomatosis. 5. Multiple enlarged pelvic lymph nodes are identified compatible with metastatic adenopathy.   Electronically Signed By: Kerby Moors  M.D. On: 07/20/2022 10:05   Surgical  pathology (07/20/22): FINAL MICROSCOPIC DIAGNOSIS:   A. ENDOCERVIX, CURETTAGE:  Low-grade squamous intraepithelial lesion, CIN-1.  Benign endocervical mucosa.   B. CERVIX, 12 OCLOCK, BIOPSY:  Low-grade squamous intraepithelial lesion, CIN-1.   C. CERVIX, 6 OCLOCK, BIOPSY:  Low-grade squamous intraepithelial lesion, CIN-1.   D. ENDOMETRIUM, CURETTAGE:  Squamous fragments with low-grade squamous intraepithelial lesion,  CIN-1.  Scanty benign endometrium and abundant mucus.   Assessment/Plan: 66 y.o. admitted for acute renal failure, also with mental status changes, who was incidentally found on imagine to have complex pelvic masses concerning for malignancy.  Recommend IR biopsy to obtain tissue diagnosis. Also recommend CT chest to complete staging, although this is non urgent. Once we have a tissue diagnosis, can better inform pt's family on prognosis and potential next steps, although have briefly shared with family already that at her current medical state, she would not be a candidate for chemotherapy based on her performance status. Would reassess for improved performance status following discharge.   Continue plan of care per Hospitalist Team   LOS: 4 days    Bernadene Bell 07/22/2022, 1:29 PM

## 2022-07-22 NOTE — Assessment & Plan Note (Addendum)
Secondary to alcohol withdrawal. Patient was managed on Keppra which is tapered off secondary to mental status change/lethargy. Depakote started in lieu of Keppra. -Continue Depakote -Outpatient neurology follow-up

## 2022-07-22 NOTE — Progress Notes (Signed)
Progress Note   Patient: [name] QQP:[id number] DOB: 01/01/1956 DOA: 07/17/2022     4 DOS: the patient was seen and examined on 07/22/2022 at 12:55PM      Brief hospital course: [name] is a 66 y.o. F with MO, BrCA in remission, alcohol dependence in remission since 6 months ago, prior alcohol withdrawal seizure, recent admission 2 weeks prior to this admission, for seizure not related to EtOH, noted to have some encephalomalacia and started on Keppra, CKD IIIb baseline 1.5-1.8, and AoCKD who presented with slowly progressive confusion, falls and sleepiness since discharge.    11/19: Admitted, Cr 6.3, as well as acute hypercarbia with respiratory acidosis; Nephrology consulted 11/20: Gyn Oncology consulted for incidental pelvic mass 11/21: Underwent biopsy of adnexal mass, nondiagnostic 11/22: IR consulted for CT guided peritoneal implant biopsy, Cr improving with IVF     Assessment and Plan: * Acute renal failure superimposed on stage 3b chronic kidney disease (HCC) Baseline Cr mid 1s.  UOP 300-600 ml per day last 3 days, Cr improving with fluids. Reported minimal intake.  No reported NSAIDs.  Urine with WBC, bacteria, no casts or nephritis.   No hydronephrosis.   Appears to be ischemic from dehydration.  Suspect ATN.  Suspect hypovolemia from poor PO intake with new Keppra. - Continue IV fluids, increase rate     Acute metabolic and toxic encephalopathy At baseline the patient has no cognitive impairment, lives independently.  In the hospital she has been somnolent, poorly responsive.  I suspect this is multifactorial from hypercarbia, renal failure, gabapentin excess, possibly Keppra. - Hold gabapentin - Avoid psychotropic medicines - Treat AKI as above - Taper Keppra as able - Repeat VBG  Pelvic mass and ascites MRI showed a 17 cm right adnexal mass.  She underwent biopsy in the OR on 1121 which was nondiagnostic. - Consult GYN oncology, appreciate  recommendations - Consult interventional radiology for biopsy of peritoneal implant  Lymphedema of left arm Chronic, due to breast cancer  Hypoglycemia Present on admission, brief, resolved.  Generalized pain This appears to be a chronic issue on review of prior admission notes.  Abnormal thyroid level free T4 - Repeat after current illness  Acute cystitis Possible UTI.  Klebsiella in admission urine culture.  Treated with 5 days cephalosporin, now resolved.  History of seizures Has a prior history of alcohol withdrawal seizures, but is been sober for more than 6 months.  1 month ago she was admitted for seizures, found to have encephalomalacia on MRI, and so started on Keppra 500 twice daily. - Continue Keppra for now, plan to taper down as able  Chronic diastolic CHF (congestive heart failure) (HCC) Respiratory failure ruled out. Body habitus precludes volume assessment, not requiring oxygen here, no reason to suggest her renal failure is cardiogenic - Hold furosemide - Continue IV fluids  Anemia in chronic kidney disease (CKD) Hemoglobin stable, no clinical bleeding  Morbid obesity with BMI of 50.0-59.9, adult (HCC) BMI 53  Essential hypertension Blood pressure controlled - Hold amlodipine, furosemide          Subjective: No nursing complaints.  They report she is not very responsive, complains of pain, but unable to localize.  No respiratory distress, vomiting, hypoxia, fever.     Physical Exam: BP 127/82 (BP Location: Right Arm)   Pulse 95   Temp 98.2 F (36.8 C) (Axillary)   Resp 19   Ht _0  (1.626 m)   Wt (!) 140.1 kg   SpO2 100%  BMI 53.02 kg/m   Obese adult female, lying in bed, appears weak and debilitated RRR, heart sounds distant, no pitting peripheral edema, otherwise body habitus precludes Assessment Respiratory effort seems normal, I cannot appreciate rales or wheezing Abdomen is without grimace or guarding to palpation, no  distention, habitus precludes accurate assessment Attention diminished, keeps her eyes closed, makes murmurs responses to questions, does not follow commands, although this appears to have symmetric generalized weakness strength of the upper extremities    Data Reviewed: Basic metabolic panel shows improving creatinine CBC shows stable hemoglobin, no leukocytosis, BUN 43  Family Communication: Granddaughter by phone    Disposition: Status is: Inpatient Patient was admitted with renal failure, she requires ongoing IV fluids  She will have a biopsy, once her renal failure improves sufficiently we will stop IV fluids and transition to SNF        Author: Edwin Dada, MD 07/22/2022 2:35 PM  For on call review [email or web address].

## 2022-07-22 NOTE — Assessment & Plan Note (Addendum)
Elevated TSH (7.279) with associated elevated free T4 (1.48). Consistent with possible primary disease. -T3 pending

## 2022-07-22 NOTE — Assessment & Plan Note (Addendum)
MRI pelvis significant for a right 17 cm adnexal mass. Gynecology/oncology consulted and biopsy performed on 11/21 but was non-diagnostic. US paracentesis ordered but not completed secondary to inadequate fluid. Interventional radiology consulted for attempted CT biopsy of peritoneal site which was performed on 11/27 -Follow-up biopsy pathology results

## 2022-07-22 NOTE — Assessment & Plan Note (Addendum)
On admission. Resolved.

## 2022-07-22 NOTE — Assessment & Plan Note (Addendum)
Lasix held. Patient with peripheral edema, however no evidence of cardiogenic etiology. Weight appears to be increasing steadily. Overall, patient is net positive 12.6 L. -Daily weights and strict in/out -May need to consider diuresis but concerning secondary to recent AKI

## 2022-07-22 NOTE — Assessment & Plan Note (Addendum)
Uncertain etiology but possibly multifactorial secondary to hypercarbia, renal failure and gabapentin/Keppra. Patient also with ascites and abdominal pain. Symptoms improved with improvement of renal function and cessation of Keppra.

## 2022-07-22 NOTE — Progress Notes (Signed)
Admit: 07/17/2022 LOS: 4  1F admit 11/18 with AKI, AMS, CKD3.    Subjective:  SCr lower over past 24h to 4.8, K 4.8, HCO3 20 UOP seems bot about 0.5L in past 24h after d/w RN Remains on IVFs LR @ 38m/h; cont poor PO BPs stable, on RA For IR biopsy of pelvic mass tomorrow  11/22 0701 - 11/23 0700 In: 1239.2 [I.V.:1139.1; IV Piggyback:100] Out: 325 [Urine:325]  Filed Weights   07/18/22 0520 07/20/22 0500 07/22/22 0304  Weight: 134 kg (!) 142 kg (!) 140.1 kg    Scheduled Meds:  Chlorhexidine Gluconate Cloth  6 each Topical Daily   levETIRAcetam  500 mg Oral BID   mouth rinse  15 mL Mouth Rinse 4 times per day   polyethylene glycol  17 g Oral Daily   senna-docusate  2 tablet Oral BID   Continuous Infusions:   ceFAZolin (ANCEF) IV Stopped (07/21/22 2343)   lactated ringers 50 mL/hr at 07/22/22 0629   PRN Meds:.acetaminophen **OR** acetaminophen, albuterol, bisacodyl, fentaNYL (SUBLIMAZE) injection, lip balm, naLOXone (NARCAN)  injection, mouth rinse, oxyCODONE  Current Labs: reviewed    Physical Exam:  Blood pressure 127/83, pulse 91, temperature 98.4 F (36.9 C), temperature source Oral, resp. rate 20, height '5\' 4"'$  (1.626 m), weight (!) 140.1 kg, SpO2 98 %. Obese, NAD, interactive RRR nl s1s2 CTAB S/nt, no ascites Trace LEE at most  Nonfocal AAO x3  A AKI on CKD3b  (BL SCr 1.5 to 1.8) CT Abd at admission without acute structural kidney issues ? 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 Morphine should not be used.  Baclofen should be avoided Avoid oral sodium phosphate and magnesium citrate based laxatives / bowel preps    RPearson GrippeMD 07/22/2022, 10:48 AM  Recent Labs  Lab 07/20/22 0530 07/21/22 0238 07/22/22 0529  NA 136 139 138  K 4.7 4.6 4.8  CL 108 110 110  CO2 18* 20* 20*  GLUCOSE 102* 88 84  BUN 45* 43* 43*  CREATININE 5.88* 5.09* 4.76*  CALCIUM 8.0* 8.3* 8.6*  PHOS 5.3* 5.3* 4.8*    Recent Labs  Lab 07/19/22 0957 07/20/22 0530 07/21/22 0238 07/22/22 0529  WBC 8.5 7.3 8.8 9.0  NEUTROABS 6.5 5.4 6.7  --   HGB 8.7* 8.6* 9.9* 10.9*  HCT 29.0* 28.2* 33.9* 35.1*  MCV 96.3 94.0 99.4 95.6  PLT 218 211 210 260

## 2022-07-22 NOTE — Assessment & Plan Note (Deleted)
She does have a history of cirrhosis although GI notes suggest this is speculative, not confirmed.  I suspect at this time her ascites is due to the pelvic mass and abdominal implants

## 2022-07-22 NOTE — Assessment & Plan Note (Addendum)
Stable

## 2022-07-22 NOTE — Assessment & Plan Note (Addendum)
Chronic. History of breast cancer and worsened secondary to IV fluids.

## 2022-07-22 NOTE — Assessment & Plan Note (Addendum)
Chronic issue, although with abdominal pain, mental status change and ascites. Paracentesis ordered but unable to complete secondary to inadequate fluid. Mental status improved. Abdominal pain possibly related to presumed cancer.

## 2022-07-22 NOTE — Assessment & Plan Note (Addendum)
Estimated body mass index is 56.69 kg/m as calculated from the following:   Height as of this encounter: '5\' 4"'$  (1.626 m).   Weight as of this encounter: 149.8 kg.

## 2022-07-22 NOTE — Assessment & Plan Note (Addendum)
Baseline creatinine of about 1.5-1.6. Creatinine of 6.23 on admission with peak of 6.30. Per nephrology, possible ATN secondary to hypovolemia. Improvement with IV fluids. Creatinine down to 3.43 but now slightly increased to 3.51 this morning. IV fluids still running at low rate. -Repeat BMP in AM

## 2022-07-22 NOTE — Assessment & Plan Note (Addendum)
Patient is on amlodipine and Coreg as an outpatient which are held. Blood pressure mostly controlled.

## 2022-07-23 ENCOUNTER — Ambulatory Visit: Payer: Medicaid Other | Admitting: Hematology and Oncology

## 2022-07-23 DIAGNOSIS — N1831 Chronic kidney disease, stage 3a: Secondary | ICD-10-CM | POA: Diagnosis not present

## 2022-07-23 DIAGNOSIS — N1832 Chronic kidney disease, stage 3b: Secondary | ICD-10-CM | POA: Diagnosis not present

## 2022-07-23 DIAGNOSIS — N179 Acute kidney failure, unspecified: Secondary | ICD-10-CM | POA: Diagnosis not present

## 2022-07-23 DIAGNOSIS — R1900 Intra-abdominal and pelvic swelling, mass and lump, unspecified site: Secondary | ICD-10-CM | POA: Diagnosis not present

## 2022-07-23 LAB — CBC
HCT: 32.6 % — ABNORMAL LOW (ref 36.0–46.0)
Hemoglobin: 10 g/dL — ABNORMAL LOW (ref 12.0–15.0)
MCH: 28.9 pg (ref 26.0–34.0)
MCHC: 30.7 g/dL (ref 30.0–36.0)
MCV: 94.2 fL (ref 80.0–100.0)
Platelets: 272 10*3/uL (ref 150–400)
RBC: 3.46 MIL/uL — ABNORMAL LOW (ref 3.87–5.11)
RDW: 15.3 % (ref 11.5–15.5)
WBC: 9.1 10*3/uL (ref 4.0–10.5)
nRBC: 0.2 % (ref 0.0–0.2)

## 2022-07-23 LAB — PROTEIN ELECTROPHORESIS, SERUM
A/G Ratio: 0.7 (ref 0.7–1.7)
Albumin ELP: 2.9 g/dL (ref 2.9–4.4)
Alpha-1-Globulin: 0.4 g/dL (ref 0.0–0.4)
Alpha-2-Globulin: 1.1 g/dL — ABNORMAL HIGH (ref 0.4–1.0)
Beta Globulin: 1 g/dL (ref 0.7–1.3)
Gamma Globulin: 1.7 g/dL (ref 0.4–1.8)
Globulin, Total: 4.2 g/dL — ABNORMAL HIGH (ref 2.2–3.9)
Total Protein ELP: 7.1 g/dL (ref 6.0–8.5)

## 2022-07-23 LAB — GLUCOSE, CAPILLARY
Glucose-Capillary: 98 mg/dL (ref 70–99)
Glucose-Capillary: 85 mg/dL (ref 70–99)
Glucose-Capillary: 82 mg/dL (ref 70–99)
Glucose-Capillary: 94 mg/dL (ref 70–99)
Glucose-Capillary: 97 mg/dL (ref 70–99)

## 2022-07-23 LAB — RENAL FUNCTION PANEL
Albumin: 2.6 g/dL — ABNORMAL LOW (ref 3.5–5.0)
Anion gap: 10 (ref 5–15)
BUN: 45 mg/dL — ABNORMAL HIGH (ref 8–23)
CO2: 19 mmol/L — ABNORMAL LOW (ref 22–32)
Calcium: 8.5 mg/dL — ABNORMAL LOW (ref 8.9–10.3)
Chloride: 108 mmol/L (ref 98–111)
Creatinine, Ser: 4.3 mg/dL — ABNORMAL HIGH (ref 0.44–1.00)
GFR, Estimated: 11 mL/min — ABNORMAL LOW (ref >60)
Glucose, Bld: 97 mg/dL (ref 70–99)
Phosphorus: 4.3 mg/dL (ref 2.5–4.6)
Potassium: 4.3 mmol/L (ref 3.5–5.1)
Sodium: 137 mmol/L (ref 135–145)

## 2022-07-23 LAB — BLOOD GAS, VENOUS
Acid-base deficit: 6.4 mmol/L — ABNORMAL HIGH (ref 0.0–2.0)
Bicarbonate: 20.2 mmol/L (ref 20.0–28.0)
O2 Saturation: 92.8 %
Patient temperature: 37.2
pCO2, Ven: 44 mmHg (ref 44–60)
pH, Ven: 7.27 (ref 7.25–7.43)
pO2, Ven: 64 mmHg — ABNORMAL HIGH (ref 32–45)

## 2022-07-23 LAB — CULTURE, BLOOD (ROUTINE X 2)
Culture: NO GROWTH
Culture: NO GROWTH

## 2022-07-23 MED ORDER — MIDAZOLAM HCL 2 MG/2ML IJ SOLN
INTRAMUSCULAR | Status: AC
  Filled 2022-07-23: qty 4

## 2022-07-23 MED ORDER — ENOXAPARIN SODIUM 30 MG/0.3ML IJ SOSY
30.0000 mg | PREFILLED_SYRINGE | Freq: Every day | INTRAMUSCULAR | Status: DC
  Administered 2022-07-23 – 2022-08-03 (×10): 30 mg via SUBCUTANEOUS
  Filled 2022-07-23 (×12): qty 0.3

## 2022-07-23 MED ORDER — SODIUM CHLORIDE 0.9 % IV SOLN
INTRAVENOUS | Status: AC
  Filled 2022-07-23: qty 500

## 2022-07-23 MED ORDER — FENTANYL CITRATE (PF) 100 MCG/2ML IJ SOLN
INTRAMUSCULAR | Status: AC
  Filled 2022-07-23: qty 2

## 2022-07-23 NOTE — Plan of Care (Signed)

## 2022-07-23 NOTE — Progress Notes (Signed)
Progress Note   Patient: [name] TJQ:[id number] DOB: 08-Dec-1955 DOA: 07/17/2022     5 DOS: the patient was seen and examined on 07/23/2022 at 9:33 AM      Brief hospital course: [name] is a 66 y.o. F with MO, BrCA in remission, alcohol dependence in remission since 6 months ago, prior alcohol withdrawal seizure, recent admission 2 weeks prior to this admission, for seizure not related to EtOH, noted to have some encephalomalacia and started on Keppra, CKD IIIb baseline 1.5-1.8, and AoCKD who presented with slowly progressive confusion, falls and sleepiness since discharge.    11/19: Admitted, Cr 6.3, as well as acute hypercarbia with respiratory acidosis; Nephrology consulted 11/20: Gyn Oncology consulted for incidental pelvic mass 11/21: Underwent biopsy of adnexal mass, nondiagnostic 11/22: IR consulted for CT guided peritoneal implant biopsy, Cr improving with IVF 11/24: Nephrology sign off     Assessment and Plan: * Acute renal failure superimposed on stage 3b chronic kidney disease (HCC) Baseline Cr mid 1s.  Urine output up to 850 yesterday with increasing fluids Creatinine down to 4.3  No reported NSAIDs.  Urine with WBC, bacteria, no casts or nephritis.   No hydronephrosis.   Appears to be ischemic from dehydration.  Suspect ATN.  Suspect hypovolemia from poor PO intake with new Keppra. - Continue IV fluids     Acute metabolic and toxic encephalopathy At baseline the patient has no cognitive impairment, lives independently.  In the hospital she has been somnolent, poorly responsive.  I suspect this is multifactorial from hypercarbia, renal failure, gabapentin excess, possibly Keppra.  This seems somewhat better today, although the patient still avoids verbal responses to anything I think this is now more volitional  Repeat VBG resolved.  Improvement today coincided with increased urine output, and stopping Keppra - Hold gabapentin - Taper Keppra (250 twice  daily today, once tomorrow, then stop) - Avoid psychotropic medicines - Treat AKI as above  Pelvic mass and ascites MRI showed a 17 cm right adnexal mass.  She underwent biopsy in the OR on 1121 which was nondiagnostic. - Consult GYN oncology, appreciate recommendations - Consult interventional radiology for biopsy of peritoneal implant  Lymphedema of left arm Chronic, due to breast cancer  Hypoglycemia Present on admission, brief, resolved.  Generalized pain This appears to be a chronic issue on review of prior admission notes.  Abnormal thyroid level free T4 - Repeat after current illness  Acute cystitis Possible UTI.  Klebsiella in admission urine culture.  Treated with 5 days cephalosporin, now resolved.  History of seizures Has a prior history of alcohol withdrawal seizures, but is been sober for more than 6 months.  1 month ago she was admitted for seizures, found to have encephalomalacia on MRI, and so started on Keppra 500 twice daily.  The current admission seems to be precipitated by starting [place]. - Continue depakote - Reduce Keppra to 250 BID, then stop  Chronic diastolic CHF (congestive heart failure) (HCC) Respiratory failure ruled out. Body habitus precludes volume assessment, not requiring oxygen here, no reason to suggest her renal failure is cardiogenic - Hold furosemide - Continue IV fluids  Anemia in chronic kidney disease (CKD) Hemoglobin stable, no clinical bleeding  Morbid obesity with BMI of 50.0-59.9, adult (HCC) BMI 53  Essential hypertension Blood pressure controlled - Hold amlodipine, furosemide          Subjective: No fever, respiratory distress, vomiting.  Nursing reports that she does not respond much, has not eaten lunch.  Physical Exam: BP 129/81 (BP Location: Right Leg)   Pulse 87   Temp 97.7 F (36.5 C) (Oral)   Resp 18   Ht _0  (1.626 m)   Wt (!) 143.3 kg   SpO2 98%   BMI 54.23 kg/m   Obese adult female,  lying in bed, opens eyes to voice when I enter the room, looks at me then closes her eyes and keeps them closed the rest of the time, does not answer questions or cooperate with commands RRR, no murmurs, no peripheral edema that is pitting Respiratory rate seems normal, lung sounds diminished due to body habitus Abdomen without grimace, but body habitus precludes examination of the heart lungs or abdomen Neurology, she appears to be attentive to my presence, but then closes her eyes and does not respond any further, does not follow commands She seems to make spontaneous movements of her arms, and moves them where I put them with independent strengthening    Data Reviewed: Nephrology and oncology notes reviewed Basic metabolic panel shows creatinine down to 4.3 VBG is normal CBC shows hemoglobin 10, no change, BUN stable    Family Communication: None present    Disposition: Status is: Inpatient The patient presented with renal failure and encephalopathy  From the standpoint of her renal failure, she is making urine and nephrology have signed off,  From the standpoint of her encephalopathy, this is still dense, so we will taper off Keppra, and hopefully her mentation will improve where in the next few days she will be able to take food by mouth, adequate fluids by mouth, and discharge to home or rehab more likely        Author: Edwin Dada, MD 07/23/2022 2:10 PM  For on call review [email or web address].

## 2022-07-23 NOTE — Progress Notes (Signed)
GYN Oncology Progress Note  Plan for: 3 Days Post-Op Procedure(s) (LRB): EXAM UNDER ANESTHESIA, CERVICAL BIOPSY, ENDOMETRIAL BIOPSY (N/A)  Subjective: Patient resting in bed. Opens eyes to stimuli and her name. One verbal response this morning but then doses back off.   Objective: Vital signs in last 24 hours: Temp:  [97.8 F (36.6 C)-98.9 F (37.2 C)] 97.8 F (36.6 C) (11/24 0617) Pulse Rate:  [90-95] 90 (11/24 0617) Resp:  [16-19] 17 (11/24 0617) BP: (114-127)/(62-82) 121/75 (11/24 0617) SpO2:  [100 %] 100 % (11/24 0617) Weight:  [315 lb 14.7 oz (143.3 kg)] 315 lb 14.7 oz (143.3 kg) (11/24 0306) Last BM Date : 07/22/22  Intake/Output from previous day: 11/23 0701 - 11/24 0700 In: 2346.3 [P.O.:320; I.V.:1851.3] Out: 850 [Urine:850]  Physical Examination: Abdominal exam: no focal tenderness to palpation, soft  Labs:      CA 125: 179 CA 27-29: <9.0  MR PELVIS WO CONTRAST 07/19/2022  Narrative CLINICAL DATA:  Evaluate adnexal mass.  EXAM: MRI PELVIS WITHOUT CONTRAST  TECHNIQUE: Multiplanar multisequence MR imaging of the pelvis was performed. No intravenous contrast was administered.  COMPARISON:  CT AP 07/18/2022  FINDINGS: Urinary Tract: The urinary bladder is decompressed around a Foley catheter balloon.  Bowel:  No dilated bowel loops identified.  Vascular/Lymphatic: Normal caliber of the visualized abdominal aorta. No vascular abnormality. Multiple enlarged pelvic lymph nodes are identified, including:  Right common iliac node measures 1.4 cm, image 8/17.  Left common iliac lymph node measures 1.6 cm, image 29/17.  Right external iliac lymph node measures 1.7 cm, image 29/17.  Left inguinal lymph node measures 1.1 cm, image 37/17.  Reproductive: Uterus: Measures 9.3 by 4.8 by 5.8 cm (volume = 140 cm^3) no discrete mass identified.  Endometrium: The endometrium is diffusely thickened measuring up to 3.4 cm, image 24/9. The endometrium exhibits  mild heterogeneous increased T2 signal and mild heterogeneous increased T1 signal.  Right ovary: Not confidently identified.  Left ovary: Not confidently identified.  Other: Large mass within the pelvis is centered around the right adnexal region, crosses the midline and partially encases the uterus. This measures 17.4 by 12.3 cm, image 21/17.  Within the left lower quadrant there is a mass which measures 11.2 x 4.7 cm, image 26/17.  Other: Moderate abdominopelvic ascites. Peritoneal nodularity is identified concerning for carcinomatosis. Index nodule within the ventral pelvis measures 1.9 x 1.4 cm, image 25/17. Within the left pelvis there is a peritoneal nodule measuring 3.7 x 2.5 cm, image 30/17.  Musculoskeletal: No suspicious bone lesions identified.  IMPRESSION: 1. Large mass measuring up to within the pelvis measures up to 17 cm, is centered around the right adnexal region, crosses the midline and partially encases the uterus. A second mass is noted within the left lower quadrant which measures up to 11.2 cm. Imaging findings are highly concerning for malignancy either ovarian or endometrium in origin. 2. There is marked thickening of the endometrium which measures up to 3.4 cm. Cannot rule out under lying endometrial neoplasm. 3. Moderate abdominopelvic ascites. 4. Peritoneal nodularity is identified concerning for carcinomatosis. 5. Multiple enlarged pelvic lymph nodes are identified compatible with metastatic adenopathy.   Electronically Signed By: Kerby Moors M.D. On: 07/20/2022 10:05   Surgical pathology (07/20/22): FINAL MICROSCOPIC DIAGNOSIS:   A. ENDOCERVIX, CURETTAGE:  Low-grade squamous intraepithelial lesion, CIN-1.  Benign endocervical mucosa.   B. CERVIX, 12 OCLOCK, BIOPSY:  Low-grade squamous intraepithelial lesion, CIN-1.   C. CERVIX, 6 OCLOCK, BIOPSY:  Low-grade squamous intraepithelial  lesion, CIN-1.   D. ENDOMETRIUM, CURETTAGE:   Squamous fragments with low-grade squamous intraepithelial lesion,  CIN-1.  Scanty benign endometrium and abundant mucus.   Assessment/Plan: 66 y.o. admitted for acute renal failure, also with mental status changes, who was incidentally found on imagine to have complex pelvic masses concerning for malignancy.  Recommend IR biopsy to obtain tissue diagnosis. Also recommend CT chest to complete staging, although this is non urgent. Once we have a tissue diagnosis, can better inform pt's family on prognosis and potential next steps, although have briefly shared with family already that at her current medical state, she would not be a candidate for chemotherapy based on her performance status. Would reassess for improved performance status following discharge.   Will follow-up the results of the biopsy and arrange follow-up as needed if patient were to discharge over the weekend. Will plan to see patient on Monday if still admitted. Continue plan of care per Hospitalist Team   LOS: 5 days    Harim Bi 07/23/2022, 7:22 AM

## 2022-07-23 NOTE — Progress Notes (Signed)
  Patient was brought to CT scan for requested pelvic mass biopsy.  Chart reviewed by IR RN and it was noticed that patient received Lovenox 30 mg SQ at 1049.  Patient had not been getting Lovenox since admission so no order was written to instruct RN to hold for procedure.  Order for Lovenox was placed after IR PAs had done am review of patient's chart to make sure she was ready for procedure.  IR anticoagulation guidelines state that Lovenox must be held x 1 dose prior to deep biopsies and organ biopsies.  Will tentatively plan for biopsy on Monday 07/26/22 as IR/CT schedule allows.  Monday's Lovenox dose is held in [facility].   NPO after MN order for Sunday night in place.  Lafern Brinkley S Shevon Sian PA-C 07/23/2022 2:26 PM

## 2022-07-23 NOTE — Progress Notes (Signed)
Admit: 07/17/2022 LOS: 5  54F admit 11/18 with AKI, AMS, CKD3.    Subjective:  SCr cont to slowly decline UOP picking up some it appears  11/23 0701 - 11/24 0700 In: 2346.3 [P.O.:320; I.V.:1851.3] Out: 850 [Urine:850]  Filed Weights   07/22/22 0304 07/23/22 0013 07/23/22 0306  Weight: (!) 140.1 kg (!) 143.3 kg (!) 143.3 kg    Scheduled Meds:  Chlorhexidine Gluconate Cloth  6 each Topical Daily   divalproex  250 mg Oral Q12H   enoxaparin (LOVENOX) injection  30 mg Subcutaneous Daily   levETIRAcetam  250 mg Oral BID   mouth rinse  15 mL Mouth Rinse 4 times per day   polyethylene glycol  17 g Oral Daily   senna-docusate  2 tablet Oral BID   Continuous Infusions:  dextrose 5% lactated ringers 100 mL/hr at 07/23/22 1020   PRN Meds:.acetaminophen **OR** acetaminophen, albuterol, bisacodyl, lip balm, naLOXone (NARCAN)  injection, mouth rinse, oxyCODONE  Current Labs: reviewed    Physical Exam:  Blood pressure 121/75, pulse 90, temperature 97.8 F (36.6 C), temperature source Oral, resp. rate 17, height '5\' 4"'$  (1.626 m), weight (!) 143.3 kg, SpO2 100 %. Obese, NAD, interactive RRR nl s1s2 CTAB S/nt, no ascites Trace LEE at most  Nonfocal AAO x3  A AKI on CKD3b, slowly improving (BL SCr 1.5 to 1.8) CT Abd at admission without acute structural kidney issues ? 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  GFR slowly improving but prognosis is poor. Will sign off at this time. Please call with questions or concerns.  Would have oncology follow outpt kidney function and we would be happy to see at [facility] offices if needed.  Will not schedule f/u at this time.  Medication Issues; Preferred narcotic agents for pain control are hydromorphone, fentanyl, and methadone. Morphine should not be used.  Baclofen should be avoided Avoid oral sodium phosphate and magnesium citrate based laxatives / bowel preps    Pearson Grippe MD 07/23/2022, 11:43 AM  Recent Labs  Lab 07/21/22 0238 07/22/22 0529 07/23/22 0436  NA 139 138 137  K 4.6 4.8 4.3  CL 110 110 108  CO2 20* 20* 19*  GLUCOSE 88 84 97  BUN 43* 43* 45*  CREATININE 5.09* 4.76* 4.30*  CALCIUM 8.3* 8.6* 8.5*  PHOS 5.3* 4.8* 4.3    Recent Labs  Lab 07/19/22 0957 07/20/22 0530 07/21/22 0238 07/22/22 0529 07/23/22 0436  WBC 8.5 7.3 8.8 9.0 9.1  NEUTROABS 6.5 5.4 6.7  --   --   HGB 8.7* 8.6* 9.9* 10.9* 10.0*  HCT 29.0* 28.2* 33.9* 35.1* 32.6*  MCV 96.3 94.0 99.4 95.6 94.2  PLT 218 211 210 260 272

## 2022-07-24 DIAGNOSIS — N1831 Chronic kidney disease, stage 3a: Secondary | ICD-10-CM | POA: Diagnosis not present

## 2022-07-24 DIAGNOSIS — N179 Acute kidney failure, unspecified: Secondary | ICD-10-CM | POA: Diagnosis not present

## 2022-07-24 DIAGNOSIS — N1832 Chronic kidney disease, stage 3b: Secondary | ICD-10-CM | POA: Diagnosis not present

## 2022-07-24 DIAGNOSIS — R1900 Intra-abdominal and pelvic swelling, mass and lump, unspecified site: Secondary | ICD-10-CM | POA: Diagnosis not present

## 2022-07-24 LAB — BASIC METABOLIC PANEL
Anion gap: 10 (ref 5–15)
BUN: 41 mg/dL — ABNORMAL HIGH (ref 8–23)
CO2: 20 mmol/L — ABNORMAL LOW (ref 22–32)
Calcium: 8.7 mg/dL — ABNORMAL LOW (ref 8.9–10.3)
Chloride: 109 mmol/L (ref 98–111)
Creatinine, Ser: 3.9 mg/dL — ABNORMAL HIGH (ref 0.44–1.00)
GFR, Estimated: 12 mL/min — ABNORMAL LOW (ref >60)
Glucose, Bld: 102 mg/dL — ABNORMAL HIGH (ref 70–99)
Potassium: 4.4 mmol/L (ref 3.5–5.1)
Sodium: 139 mmol/L (ref 135–145)

## 2022-07-24 LAB — GLUCOSE, CAPILLARY
Glucose-Capillary: 100 mg/dL — ABNORMAL HIGH (ref 70–99)
Glucose-Capillary: 106 mg/dL — ABNORMAL HIGH (ref 70–99)
Glucose-Capillary: 80 mg/dL (ref 70–99)
Glucose-Capillary: 88 mg/dL (ref 70–99)
Glucose-Capillary: 93 mg/dL (ref 70–99)
Glucose-Capillary: 94 mg/dL (ref 70–99)

## 2022-07-24 LAB — CBC
HCT: 32.3 % — ABNORMAL LOW (ref 36.0–46.0)
Hemoglobin: 10 g/dL — ABNORMAL LOW (ref 12.0–15.0)
MCH: 29.1 pg (ref 26.0–34.0)
MCHC: 31 g/dL (ref 30.0–36.0)
MCV: 93.9 fL (ref 80.0–100.0)
Platelets: 310 10*3/uL (ref 150–400)
RBC: 3.44 MIL/uL — ABNORMAL LOW (ref 3.87–5.11)
RDW: 15.5 % (ref 11.5–15.5)
WBC: 9.8 10*3/uL (ref 4.0–10.5)
nRBC: 0.2 % (ref 0.0–0.2)

## 2022-07-24 NOTE — Evaluation (Signed)
Physical Therapy Evaluation Patient Details Name: [name] MRN: [id number] DOB: 1956-05-15 Today's Date: 07/24/2022  History of Present Illness  [name] is a 66 y.o. F with MO, BrCA in remission, alcohol dependence in remission since 6 months ago, prior alcohol withdrawal seizure, recent admission 2 weeks prior (recent discharge on 06/29/2022) to this admission, for seizure not related to EtOH, noted to have some encephalomalacia and AoCKD who presented this hospitalization with slowly progressive confusion, falls and sleepiness with Cr 6.3, acute hypercabia with respiratory acidosis, as well as a pelvic mass . Underwent biopsy 11/21 of adnexal mass, and 11/22 IR  for CT guided implant biopsy.   Clinical Impression  Pt today was very lethargic and moaning out in pain with very light tough to any part of her body ( hypersensitive) , improved a little with B LE and B UE slow AAROM/PROM however the L UE was very painful and full edema with pitting, likely lymphedema from lymph node resections in past. Son states she has had some swelling in LUE in past but seemed to resolve itself and has not officially received education or compression wrapping in the past.     Pt was walking 10 days ago before coming into the hospital and feel we need to have some aggressive PT in order to help pt regain that mobility and strength. Family is supportive. Recommending ST-SNF at this time, however if pt begins to make some progress over next few day, may consider CIR as possibility. Will continue to follow.       Recommendations for follow up therapy are one component of a multi-disciplinary discharge planning process, led by the attending physician.  Recommendations may be updated based on patient status, additional functional criteria and insurance authorization.  Follow Up Recommendations Skilled nursing-short term rehab (<3 hours/day) Can patient physically be transported by private vehicle: No     Assistance Recommended at Discharge Frequent or constant Supervision/Assistance  Patient can return home with the following  Two people to help with bathing/dressing/bathroom;A lot of help with bathing/dressing/bathroom;Assistance with cooking/housework;Assistance with feeding;Help with stairs or ramp for entrance    Equipment Recommendations None recommended by PT  Recommendations for Other Services  OT consult    Functional Status Assessment Patient has had a recent decline in their functional status and demonstrates the ability to make significant improvements in function in a reasonable and predictable amount of time.     Precautions / Restrictions Precautions Precaution Comments: seizure precautions, and LUE edema Restrictions Weight Bearing Restrictions: No      Mobility  Bed Mobility               General bed mobility comments: not able to test today, was focused on improved bed positioning, B UE and B LE PROM/AAROM for beginnign movment, will begin tilts after we get the pain in LUE addressed with some light wrappign later today.    Transfers                        Ambulation/Gait                  Stairs            Wheelchair Mobility    Modified Rankin (Stroke Patients Only)       Balance  Pertinent Vitals/Pain Pain Assessment Pain Assessment: Faces Faces Pain Scale: Hurts even more Breathing: normal Negative Vocalization: repeated troubled calling out, loud moaning/groaning, crying Facial Expression: sad, frightened, frown Body Language: tense, distressed pacing, fidgeting Consolability: distracted or reassured by voice/touch PAINAD Score: 5 Pain Location: Left arm with any movement was the biggest source of pain, however any light touch pt cried out. Pt seemed hypersensitive to very little touch. Improved a little by reassuring and increasing some movement in  that limb. Pain Descriptors / Indicators: Discomfort, Grimacing, Guarding Pain Intervention(s): Monitored during session, Limited activity within patient's tolerance, Repositioned    Home Living Family/patient expects to be discharged to:: Skilled nursing facility Living Arrangements: Other relatives Available Help at Discharge: Family;Friend(s);Available 24 hours/day (pt's son assists a lot) Type of Home: Apartment Home Access: Ramped entrance       Home Layout: One level Home Equipment: Conservation officer, nature (2 wheels);Cane - single point;Wheelchair - manual;Tub bench Additional Comments: no longer has an aide (per PT); pt told me that her niece is her aide; has friend who comes if grandaughter not there    Prior Function Prior Level of Function : Needs assist             Mobility Comments: Pt unable to give history, aquired PLOF from chart and from son via phone. Pt's son states she was walkign using RW about 40 feet at a time int eh home and to the car. Was doing fairly well until about 3 days prior to this addmission ( 11/15/202) when she was not moving much at all. ADLs Comments: niece comes to assist with shower; grandaughter assists with dressing; does cooking and cleaning ( from previous admission history)     Hand Dominance        Extremity/Trunk Assessment   Upper Extremity Assessment Upper Extremity Assessment:  (LUE grossly swollen with pitting edema, likely lymphadema and very painful to light touch,)    Lower Extremity Assessment Lower Extremity Assessment: Difficult to assess due to impaired cognition;Generalized weakness (pt had very little volitional movement in UEs or LE, was able to perform small DF/PF movments but not full range, Knee flexion and hip flexion limited by body habitus, however all knee, hip was 2-/5 for supine movment at this time and in this position)    Cervical / Trunk Assessment Cervical / Trunk Exceptions: obesity, not able to assess today  due to stayed supine working more with B UE and BLE movment, L UE swelling and positioning during session.  Communication   Communication:  (very Designer, television/film set today)  Cognition Arousal/Alertness: Lethargic Behavior During Therapy: Flat affect Overall Cognitive Status: No family/caregiver present to determine baseline cognitive functioning                                 General Comments: No family present so difficult to assess cognition and baseline, however pt very lethagic and difficult to answer questions and commands at times        General Comments      Exercises     Assessment/Plan    PT Assessment Patient needs continued PT services  PT Problem List Decreased activity tolerance;Decreased balance;Decreased mobility;Obesity;Pain       PT Treatment Interventions DME instruction;Gait training;Therapeutic activities;Therapeutic exercise;Functional mobility training    PT Goals (Current goals can be found in the Care Plan section)  Acute Rehab PT Goals PT Goal Formulation: Patient unable to participate in goal  setting Time For Goal Achievement:  Potential to Achieve Goals: Good    Frequency Min 3X/week     Co-evaluation               AM-PAC PT "6 Clicks" Mobility  Outcome Measure Help needed turning from your back to your side while in a flat bed without using bedrails?: Total Help needed moving from lying on your back to sitting on the side of a flat bed without using bedrails?: Total Help needed moving to and from a bed to a chair (including a wheelchair)?: Total Help needed standing up from a chair using your arms (e.g., wheelchair or bedside chair)?: Total Help needed to walk in hospital room?: Total Help needed climbing 3-5 steps with a railing? : Total 6 Click Score: 6    End of Session   Activity Tolerance: Patient limited by lethargy;Patient limited by pain Patient left: in bed   PT Visit Diagnosis: Muscle weakness (generalized)  (M62.81);Other abnormalities of gait and mobility (R26.89)    Time: 1050-1120 PT Time Calculation (min) (ACUTE ONLY): 30 min   Charges:   PT Evaluation $PT Eval Moderate Complexity: 1 Mod PT Treatments $Therapeutic Activity: 8-22 mins        Gatha Mayer, PT, MPT Acute Rehabilitation Services Office: [phone] If a weekend: [facility] w/e pager [phone] 07/24/2022   Clide Dales 07/24/2022, 2:20 PM

## 2022-07-24 NOTE — Plan of Care (Signed)
  Problem: Education: Goal: Knowledge of General Education information will improve Description: Including pain rating scale, medication(s)/side effects and non-pharmacologic comfort measures Outcome: Progressing   Problem: Activity: Goal: Risk for activity intolerance will decrease Outcome: Not Progressing

## 2022-07-24 NOTE — Progress Notes (Signed)
Progress Note   Patient: [name] GGE:[id number] DOB: Nov 22, 1955 DOA: 07/17/2022     6 DOS: the patient was seen and examined on 07/24/2022 at 8:59AM      Brief hospital course: Mrs. Yono is a 66 y.o. F with MO, BrCA in remission, alcohol dependence in remission since 6 months ago, prior alcohol withdrawal seizure, recent admission 2 weeks prior to this admission, for seizure not related to EtOH, noted to have some encephalomalacia and started on Keppra, CKD IIIb baseline 1.5-1.8, and AoCKD who presented with slowly progressive confusion, falls and sleepiness since discharge.    11/19: Admitted, Cr 6.3, as well as acute hypercarbia with respiratory acidosis; Nephrology consulted 11/20: Gyn Oncology consulted for incidental pelvic mass 11/21: Underwent biopsy of adnexal mass, nondiagnostic 11/22: IR consulted for CT guided peritoneal implant biopsy, Cr improving with IVF 11/24: Nephrology sign off, mentation improved 11/25: Mentation improving, Cr better     Assessment and Plan: * Acute renal failure superimposed on stage 3b chronic kidney disease (HCC) Baseline Cr mid 1s.  Admission Cr >6.  Urine output up to 800 cc again yesterday.  Cr continues trending down to 3.9 today   No reported NSAIDs.  Urine with WBC, bacteria, no casts or nephritis.   No hydronephrosis.   Appears to be ischemic from dehydration.  Suspect ATN.  Suspect hypovolemia from poor PO intake with new Keppra. - Continue IV fluids until consistently taking PO, reduce rate - Strict I/Os      Acute metabolic and toxic encephalopathy At baseline the patient has no cognitive impairment, lives independently.  In the hospital she was completely somnolent at admission, poorly responsive.  Likely multifactorial from hypercarbia, renal failure, gabapentin excess, possibly Keppra.  Repeat VBG resolved.    Starting 11/24, this has been better, again more alert today.   Improvement coincided with increased urine  output, and stopping Keppra  - Stop gabapentin - Tapered Keppra, will stop today - Avoid psychotropic medicines - Treat AKI as above - IV fluids until able to consistently take PO  Pelvic mass and ascites MRI showed a 17 cm right adnexal mass.  She underwent biopsy in the OR on 1121 which was nondiagnostic. - Consult GYN oncology, appreciate recommendations - Consult interventional radiology for biopsy of peritoneal implant, plan for CT biopsy on Monday  Stage 3b chronic kidney disease (CKD) ([place]) Baseline Cr 1.1  Lymphedema of left arm Chronic, due to breast cancer.  Worse today given IV fluids - Reduce IV fluids  Hypoglycemia Present on admission, brief, resolved.  Generalized pain This appears to be a chronic issue on review of prior admission notes.  Abnormal thyroid level free T4 - Repeat after current illness  Acute cystitis Possible UTI.  Klebsiella in admission urine culture.  Treated with 5 days cephalosporin, now resolved.  History of seizures Has a prior history of alcohol withdrawal seizures, but has been sober for more than 6 months.  1 month ago she was admitted for seizures, found to have encephalomalacia on MRI, and so started on Keppra 500 twice daily.  Since that admission, she has been increasingly somnolent and with poor PO intake, until returning to the hospital.  Here, Keppra tapered off, Depakote started in its place.  No seizure like activity here - Continue depakote - Stop Keppra today - Outpatient neurology follow up  Chronic diastolic CHF (congestive heart failure) (HCC) Respiratory failure ruled out. Body habitus precludes volume assessment, not requiring oxygen here, no reason to suggest her renal  failure is cardiogenic - Hold furosemide - Continue IV fluids  Anemia in chronic kidney disease (CKD) Hemoglobin stable, no clinical bleeding  Morbid obesity with BMI of 50.0-59.9, adult (HCC) BMI 53  Essential hypertension Blood pressure  controlled - Hold amlodipine, furosemide          Subjective: No fever, no respiratory distress. Nursing note left arm is swollen again. Still not forthcoming with answers, but opens eyes briefly, for the first time today, answers some questions for me with one word answers.  Complains of "everything is bad" which I think means everything hurts.     Physical Exam: BP (!) 132/94 (BP Location: Right Wrist)   Pulse 94   Temp 98.4 F (36.9 C) (Oral)   Resp 17   Ht _0  (1.626 m)   Wt (!) 149.8 kg   SpO2 100%   BMI 56.69 kg/m   Obese adult female, lying in bed, opens eyes, then closes them again and keeps them closed with a respiratory conversation, gives scant one-word answers occasionally RRR, no murmurs, no peripheral edema except in the left arm which is slightly swollen Respiratory rate normal, lungs clear without rales or wheezes Abdomen soft without tenderness palpation or guarding She seems aware of my presence, responds to some of my questions, but keeps her eyes closed, gives one-word answers, does not follow commands consistently.  She seems to have generalized weakness but symmetric strength in the upper extremities, she does not cooperate to lift her legs.     Data Reviewed: CBC shows stable anemia BMP shows improving renal function, normal electrolytes   Family Communication: None present    Disposition: Status is: Inpatient Paitent admitted with renal failure, encephalopathy, also new pelvic mass  The renal failure is improvinga nd nephrology have signed off  Encephalopathy improving, but still quite somnolent, and at present, nursing report she will not eat anything and only drinks sips of fluids.    Given that, will need ongoing fluids IV until able to take adequate fluids PO, then will discharge to SNF  Shaune Leeks this will take several days, and so we will have the convenience of completing her CT biopsy on Monday, but otherwise this could be  completed as an outpatient.           Author: Edwin Dada, MD 07/24/2022 11:27 AM  For on call review [email or web address].

## 2022-07-24 NOTE — Progress Notes (Signed)
Physical Therapy Treatment Patient Details Name: [name] MRN: [id number] DOB: [birth year] Today's Date: 07/24/2022   History of Present Illness Mrs. Lowdermilk is a 66 y.o. F with MO, BrCA in remission, alcohol dependence in remission since 6 months ago, prior alcohol withdrawal seizure, recent admission 2 weeks prior (recent discharge on 06/29/2022) to this admission, for seizure not related to EtOH, noted to have some encephalomalacia and AoCKD who presented this hospitalization with slowly progressive confusion, falls and sleepiness with Cr 6.3, acute hypercabia with respiratory acidosis, as well as a pelvic mass . Underwent biopsy 11/21 of adnexal mass, and 11/22 IR  for CT guided implant biopsy.    PT Comments    Patient seen for follow up session to measure and wrap Lt UE given significant edema with know chronic lymphedema of Lt UE secondary to breast cancer and lymph node resection. Pt and family educated on signs of decreased circulation and pt had good capillary refill in nail beds on Lt UE after limb wrapping. Discussed with RN to assess periodically to ensure circulation is not impaired. Wrap applied in 3 steps (tricofix sleeve to forearm, soft woven foam wrapped on forearm and distal bicep, and 1 layer of short stretch bandage from wrist to distal bicep in spiral pattern. During session pt required total assist to turn for repositioning of pad and skin check. EOS pt resting supine with Lt UE elevated on pillows and propped slightly in shoulder abduction. Will follow up for 24 hour skin check to assess tolerance to compression wrapping.    Lt UE location Circumference (cm)  Axilla 55.8  Upper Bicep 63.1  Mid bicep 66.5  Lower bicep 65  Elbow crease 43.2  Widest forearm 43.8  Distal forearm 31.3  Wrist  26     Recommendations for follow up therapy are one component of a multi-disciplinary discharge planning process, led by the attending physician.  Recommendations may be updated  based on patient status, additional functional criteria and insurance authorization.  Follow Up Recommendations  Skilled nursing-short term rehab (<3 hours/day) Can patient physically be transported by private vehicle: No   Assistance Recommended at Discharge Frequent or constant Supervision/Assistance  Patient can return home with the following Two people to help with bathing/dressing/bathroom;A lot of help with bathing/dressing/bathroom;Assistance with cooking/housework;Assistance with feeding;Help with stairs or ramp for entrance   Equipment Recommendations  None recommended by PT    Recommendations for Other Services OT consult     Precautions / Restrictions Precautions Precaution Comments: seizure precautions, and LUE edema Restrictions Weight Bearing Restrictions: No     Mobility  Bed Mobility Overal bed mobility: Needs Assistance Bed Mobility: Rolling Rolling: Total assist         General bed mobility comments: total Assist +3 for safety and positioning with use of KREG bed turn assist.             Cognition Arousal/Alertness: Awake/alert Behavior During Therapy: Flat affect Overall Cognitive Status: Difficult to assess                                 General Comments: family reports it is the first time they have spoken with pt in 10 days. pt moaning occasionally but answering questions more with pt's family present        Exercises      General Comments        Pertinent Vitals/Pain Pain Assessment Pain Assessment: Faces Faces Pain  Scale: Hurts even more Pain Location: Lt UE and generalized with mobility Pain Descriptors / Indicators: Discomfort, Grimacing, Guarding, Moaning Pain Intervention(s): Monitored during session, Repositioned (elevated)    Home Living Family/patient expects to be discharged to:: Skilled nursing facility Living Arrangements: Other relatives Available Help at Discharge: Family;Friend(s);Available 24  hours/day (pt's son assists a lot) Type of Home: Apartment Home Access: Ramped entrance       Home Layout: One level Home Equipment: Conservation officer, nature (2 wheels);Cane - single point;Wheelchair - manual;Tub bench Additional Comments: no longer has an aide (per PT); pt told me that her niece is her aide; has friend who comes if grandaughter not there    Prior Function            PT Goals (current goals can now be found in the care plan section) Acute Rehab PT Goals Patient Stated Goal: go home PT Goal Formulation: Patient unable to participate in goal setting Time For Goal Achievement:  Potential to Achieve Goals: Good Progress towards PT goals: Progressing toward goals    Frequency    Min 3X/week      PT Plan Current plan remains appropriate    Co-evaluation              AM-PAC PT "6 Clicks" Mobility   Outcome Measure  Help needed turning from your back to your side while in a flat bed without using bedrails?: Total Help needed moving from lying on your back to sitting on the side of a flat bed without using bedrails?: Total Help needed moving to and from a bed to a chair (including a wheelchair)?: Total Help needed standing up from a chair using your arms (e.g., wheelchair or bedside chair)?: Total Help needed to walk in hospital room?: Total Help needed climbing 3-5 steps with a railing? : Total 6 Click Score: 6    End of Session   Activity Tolerance: Patient limited by pain;Patient limited by lethargy (lethargic at EOS) Patient left: in bed;with call bell/phone within reach Nurse Communication: Mobility status PT Visit Diagnosis: Muscle weakness (generalized) (M62.81);Other abnormalities of gait and mobility (R26.89) Pain - Right/Left: Left Pain - part of body: Arm;Hand;Shoulder     Time: 4782-9562 PT Time Calculation (min) (ACUTE ONLY): 48 min  Charges:  $Therapeutic Activity: 23-37 mins                     Verner Mould, DPT Acute  Rehabilitation Services Office [phone]  07/24/22 4:27 PM

## 2022-07-24 NOTE — Assessment & Plan Note (Signed)
Baseline creatinine of 1.5-1.6.

## 2022-07-25 ENCOUNTER — Inpatient Hospital Stay (HOSPITAL_COMMUNITY): Payer: Medicare Other

## 2022-07-25 DIAGNOSIS — Z6841 Body Mass Index (BMI) 40.0 and over, adult: Secondary | ICD-10-CM

## 2022-07-25 DIAGNOSIS — N1832 Chronic kidney disease, stage 3b: Secondary | ICD-10-CM | POA: Diagnosis not present

## 2022-07-25 DIAGNOSIS — R946 Abnormal results of thyroid function studies: Secondary | ICD-10-CM

## 2022-07-25 DIAGNOSIS — N179 Acute kidney failure, unspecified: Secondary | ICD-10-CM | POA: Diagnosis not present

## 2022-07-25 DIAGNOSIS — G9341 Metabolic encephalopathy: Secondary | ICD-10-CM | POA: Diagnosis not present

## 2022-07-25 DIAGNOSIS — R1900 Intra-abdominal and pelvic swelling, mass and lump, unspecified site: Secondary | ICD-10-CM | POA: Diagnosis not present

## 2022-07-25 LAB — GLUCOSE, CAPILLARY
Glucose-Capillary: 68 mg/dL — ABNORMAL LOW (ref 70–99)
Glucose-Capillary: 73 mg/dL (ref 70–99)
Glucose-Capillary: 74 mg/dL (ref 70–99)
Glucose-Capillary: 76 mg/dL (ref 70–99)
Glucose-Capillary: 77 mg/dL (ref 70–99)
Glucose-Capillary: 79 mg/dL (ref 70–99)
Glucose-Capillary: 86 mg/dL (ref 70–99)
Glucose-Capillary: 93 mg/dL (ref 70–99)

## 2022-07-25 LAB — COMPREHENSIVE METABOLIC PANEL
ALT: <5 U/L (ref 0–44)
AST: 16 U/L (ref 15–41)
Albumin: 2.4 g/dL — ABNORMAL LOW (ref 3.5–5.0)
Alkaline Phosphatase: 44 U/L (ref 38–126)
Anion gap: 10 (ref 5–15)
BUN: 39 mg/dL — ABNORMAL HIGH (ref 8–23)
CO2: 20 mmol/L — ABNORMAL LOW (ref 22–32)
Calcium: 8.5 mg/dL — ABNORMAL LOW (ref 8.9–10.3)
Chloride: 108 mmol/L (ref 98–111)
Creatinine, Ser: 3.43 mg/dL — ABNORMAL HIGH (ref 0.44–1.00)
GFR, Estimated: 14 mL/min — ABNORMAL LOW (ref >60)
Glucose, Bld: 90 mg/dL (ref 70–99)
Potassium: 4.9 mmol/L (ref 3.5–5.1)
Sodium: 138 mmol/L (ref 135–145)
Total Bilirubin: 0.4 mg/dL (ref 0.3–1.2)
Total Protein: 6.7 g/dL (ref 6.5–8.1)

## 2022-07-25 LAB — CBC
HCT: 31.4 % — ABNORMAL LOW (ref 36.0–46.0)
Hemoglobin: 9.7 g/dL — ABNORMAL LOW (ref 12.0–15.0)
MCH: 29 pg (ref 26.0–34.0)
MCHC: 30.9 g/dL (ref 30.0–36.0)
MCV: 94 fL (ref 80.0–100.0)
Platelets: 316 10*3/uL (ref 150–400)
RBC: 3.34 MIL/uL — ABNORMAL LOW (ref 3.87–5.11)
RDW: 15.6 % — ABNORMAL HIGH (ref 11.5–15.5)
WBC: 9.8 10*3/uL (ref 4.0–10.5)
nRBC: 0.2 % (ref 0.0–0.2)

## 2022-07-25 MED ORDER — THIAMINE HCL 100 MG/ML IJ SOLN
250.0000 mg | Freq: Every day | INTRAVENOUS | Status: AC
  Administered 2022-07-30 – 2022-08-03 (×5): 250 mg via INTRAVENOUS
  Filled 2022-07-25 (×5): qty 2.5

## 2022-07-25 MED ORDER — LIDOCAINE HCL 1 % IJ SOLN
INTRAMUSCULAR | Status: AC
  Filled 2022-07-25: qty 20

## 2022-07-25 MED ORDER — THIAMINE MONONITRATE 100 MG PO TABS: 100.0000 mg | ORAL_TABLET | Freq: Every day | ORAL | Status: DC

## 2022-07-25 MED ORDER — THIAMINE HCL 100 MG/ML IJ SOLN
500.0000 mg | INTRAVENOUS | Status: AC
  Administered 2022-07-25 – 2022-07-29 (×5): 500 mg via INTRAVENOUS
  Filled 2022-07-25 (×5): qty 5

## 2022-07-25 MED ORDER — ADULT MULTIVITAMIN W/MINERALS CH
1.0000 | ORAL_TABLET | Freq: Every day | ORAL | Status: DC
  Administered 2022-07-25 – 2022-08-03 (×10): 1 via ORAL
  Filled 2022-07-25 (×10): qty 1

## 2022-07-25 NOTE — Progress Notes (Signed)
Physical Therapy Treatment Patient Details Name: [name] MRN: [id number] DOB: 1955-10-24 Today's Date: 07/25/2022   History of Present Illness [name] is a 66 y.o. F with MO, BrCA in remission, alcohol dependence in remission since 6 months ago, prior alcohol withdrawal seizure, recent admission 2 weeks prior (recent discharge on 06/29/2022) to this admission, for seizure not related to EtOH, noted to have some encephalomalacia and AoCKD who presented this hospitalization with slowly progressive confusion, falls and sleepiness with Cr 6.3, acute hypercabia with respiratory acidosis, as well as a pelvic mass . Underwent biopsy 11/21 of adnexal mass, and 11/22 IR  for CT guided implant biopsy.    PT Comments    24-hour skin check completed for assessment of tolerance to Lt UE wrapping for lymphedema management. Pt reports reduced pain in Lt hand and forearm and measurements indicated decreased edema below the elbow. UE circumference increased above the elbow which was expected as this portion was not wrapped. Today gentle edema massage provided and skin moisturized prior to wrapping. Sleeve provided to entire length of Lt UE axilla to wrist and soft foam gauze applied to arm with increased layer at forearm to facilitate positioning for upper arm wrapping. Wrap applied in 3 steps (sleeve, soft woven foam/gauze, short stretch bandage from wrist to upper bicep. Educated on signs of decreased circulation and pt had good capillary refill in nail beds and still able to move Lt hand. Discussed with RN to assess periodically to ensure circulation is not impaired. Will follow up tomorrow of Tuesday for additional measurements, skin check, and wrapping.   Lt UE location Circumference (cm) 11/25 Circumference (cm) 11/26  Axilla 55.8 56.6  Upper Bicep 63.1 65.9  Mid bicep 66.5 67.5  Lower bicep 65 64.2  Elbow crease 43.2 40.4  Widest forearm 43.8 39  Distal forearm 31.3 28  Wrist  26 24.6      Recommendations for follow up therapy are one component of a multi-disciplinary discharge planning process, led by the attending physician.  Recommendations may be updated based on patient status, additional functional criteria and insurance authorization.  Follow Up Recommendations  Skilled nursing-short term rehab (<3 hours/day) Can patient physically be transported by private vehicle: No   Assistance Recommended at Discharge Frequent or constant Supervision/Assistance  Patient can return home with the following Two people to help with bathing/dressing/bathroom;Assistance with cooking/housework;Assistance with feeding;Assist for transportation   Equipment Recommendations  None recommended by PT    Recommendations for Other Services       Precautions / Restrictions Precautions Precaution Comments: seizure precautions, and LUE edema Restrictions Weight Bearing Restrictions: No       PT Goals (current goals can now be found in the care plan section) Acute Rehab PT Goals Patient Stated Goal: go home PT Goal Formulation: Patient unable to participate in goal setting Time For Goal Achievement:  Potential to Achieve Goals: Fair Progress towards PT goals: Progressing toward goals    Frequency    Min 2X/week      PT Plan Current plan remains appropriate;Frequency needs to be updated    Co-evaluation              AM-PAC PT "6 Clicks" Mobility   Outcome Measure  Help needed turning from your back to your side while in a flat bed without using bedrails?: Total Help needed moving from lying on your back to sitting on the side of a flat bed without using bedrails?: Total Help needed moving to and from a  bed to a chair (including a wheelchair)?: Total Help needed standing up from a chair using your arms (e.g., wheelchair or bedside chair)?: Total Help needed to walk in hospital room?: Total Help needed climbing 3-5 steps with a railing? : Total 6 Click Score:  6    End of Session   Activity Tolerance: Patient tolerated treatment well;Patient limited by pain Patient left: in bed;with call bell/phone within reach;with nursing/sitter in room Nurse Communication: Mobility status PT Visit Diagnosis: Muscle weakness (generalized) (M62.81);Other abnormalities of gait and mobility (R26.89) Pain - Right/Left: Left Pain - part of body: Arm;Hand;Shoulder     Time: 1165-7903 PT Time Calculation (min) (ACUTE ONLY): 28 min  Charges: $Therapeutic Activity: 8-22 mins $Self Care/Home Management: 8-22                     [name], DPT Acute Rehabilitation Services Office [phone]  07/25/22 3:41 PM

## 2022-07-25 NOTE — Evaluation (Addendum)
Occupational Therapy Evaluation Patient Details Name: [name] MRN: [id number] DOB: 10-01-1955 Today's Date: 07/25/2022   History of Present Illness [name] is a 66 y.o. F with MO, BrCA in remission, alcohol dependence in remission since 6 months ago, prior alcohol withdrawal seizure, recent admission 2 weeks prior (recent discharge on 06/29/2022) to this admission, for seizure not related to EtOH, noted to have some encephalomalacia and AoCKD who presented this hospitalization with slowly progressive confusion, falls and sleepiness with Cr 6.3, acute hypercabia with respiratory acidosis, as well as a pelvic mass . Underwent biopsy 11/21 of adnexal mass, and 11/22 IR  for CT guided implant biopsy.   Clinical Impression   Patient is a 66 year old female who was admitted for above. Patient was living at home with family support prior level. Patient currently is +3 for movement with increased edema in LUE with PT currently working on Lymphedema management. Patient was noted to have limited participation in session with increased pain and fatigue impacting grooming ADL tasks requiring mod A. Patient was noted to have decreased functional activity tolernace, decreased ROM, decreased BUE strength, decreased endurance, decreased sitting balance, decreased standing balanced, decreased safety awareness, and decreased knowledge of AE/AD impacting participation in ADLs. Patient would continue to benefit from skilled OT services at this time while admitted and after d/c to address noted deficits in order to improve overall safety and independence in ADLs.        Recommendations for follow up therapy are one component of a multi-disciplinary discharge planning process, led by the attending physician.  Recommendations may be updated based on patient status, additional functional criteria and insurance authorization.   Follow Up Recommendations  Skilled nursing-short term rehab (<3 hours/day)      Assistance Recommended at Discharge Frequent or constant Supervision/Assistance  Patient can return home with the following Direct supervision/assist for medications management;Assistance with cooking/housework;Direct supervision/assist for financial management;Assist for transportation;Help with stairs or ramp for entrance;Two people to help with walking and/or transfers;Two people to help with bathing/dressing/bathroom;Assistance with feeding    Functional Status Assessment  Patient has had a recent decline in their functional status and demonstrates the ability to make significant improvements in function in a reasonable and predictable amount of time.  Equipment Recommendations  Other (comment) (defer to next venue)    Recommendations for Other Services       Precautions / Restrictions Precautions Precautions: Other (comment) Precaution Comments: seizure precautions, and LUE edema Restrictions Weight Bearing Restrictions: No      Mobility Bed Mobility Overal bed mobility: Needs Assistance             General bed mobility comments: total Assist +3 for safety and positioning          ADL either performed or assessed with clinical judgement   ADL Overall ADL's : Needs assistance/impaired Eating/Feeding: Minimal assistance;Bed level Eating/Feeding Details (indicate cue type and reason): to take sips from cup with increased time. needed cues and physical A to pick up cup and attempt with RUE rather than waiting for therapist to bring to mouth Grooming: Moderate assistance;Bed level Grooming Details (indicate cue type and reason): patient was mod A to wash face with limited power behind attempted hand over hand. patient was noted to later in session scratch opposite side of face with MI. patient was educated on importance of completing oral hygiene each day. patient vebrlaized understanding and was not receptive to engagement in oral hygiene during session with hand over hand  assistance  TD. nurse made aware of oral hygiene concerns with layer of discoloration on tounge. patients nurse to message MD. Upper Body Bathing: Total assistance;Bed level   Lower Body Bathing: Bed level;Total assistance   Upper Body Dressing : Bed level;Total assistance   Lower Body Dressing: Bed level;Total assistance   Toilet Transfer: +2 for safety/equipment;+2 for physical assistance;Total assistance Toilet Transfer Details (indicate cue type and reason): patient was +3 assistance to reposition in bed to middle of bed. Toileting- Clothing Manipulation and Hygiene: Total assistance;Bed level  Patient was extensively educated on importance of engagement in ADLs. Patient verbalized understanding.     Patient was positioned with BUE elevated with hands higher than elbows and educated on importance of keeping BUE elevated to prevent edema buildup. Patient verbalized understanding.          Pertinent Vitals/Pain Pain Assessment Pain Assessment: Faces Faces Pain Scale: Hurts whole lot Pain Location: Lt UE with movement Pain Descriptors / Indicators: Discomfort, Grimacing, Guarding, Moaning Pain Intervention(s): Limited activity within patient's tolerance, Monitored during session, Repositioned     Hand Dominance     Extremity/Trunk Assessment Upper Extremity Assessment Upper Extremity Assessment: LUE deficits/detail;RUE deficits/detail RUE Deficits / Details: patient was able to abduc UE to about 90 degreed with AAROM. patient was not engaged in task to complete active shoudler FF or elbow movements on this date with no effort put behind AAROM attempts even with cues. LUE Deficits / Details: grossly swollen with PT noted to be completing edema management for lymphedema on this date. patient is able to move shoulder and elbow minimally but unable to wiggle digits when asked on this date.   Lower Extremity Assessment Lower Extremity Assessment: Defer to PT evaluation   Cervical /  Trunk Assessment Cervical / Trunk Assessment: Other exceptions Cervical / Trunk Exceptions: patient noted to have obesity impacting ROM of BLE and BUE   Communication     Cognition Arousal/Alertness: Awake/alert Behavior During Therapy: Flat affect Overall Cognitive Status: Difficult to assess                                 General Comments: Patient is moaning during session but able to follow some commands. patient noted to have increased engagement in instrinsicly motivated tasks during session. difficult to fully assess deficits with inconsisencies during session     General Comments       Exercises     Shoulder Instructions      Home Living Family/patient expects to be discharged to:: Skilled nursing facility Living Arrangements: Other relatives Available Help at Discharge: Family;Friend(s);Available 24 hours/day Type of Home: Apartment Home Access: Ramped entrance     Home Layout: One level     Bathroom Shower/Tub: Teacher, early years/pre: Standard Bathroom Accessibility: No   Home Equipment: Conservation officer, nature (2 wheels);Cane - single point;Wheelchair - manual;Tub bench          Prior Functioning/Environment Prior Level of Function : Needs assist             Mobility Comments: Pt unable to give history, aquired PLOF from chart . Pt's son states she was walking using RW about 40 feet at a time in the home and to the car. Was doing fairly well until about 3 days prior to this addmission ( 11/15/202) when she was not moving much at all. ADLs Comments: niece comes to assist with shower; grandaughter assists with dressing; does cooking  and cleaning        OT Problem List: Decreased strength;Decreased activity tolerance;Impaired balance (sitting and/or standing);Decreased safety awareness;Obesity;Pain;Impaired UE functional use;Increased edema      OT Treatment/Interventions: Self-care/ADL training;Therapeutic exercise;Energy  conservation;DME and/or AE instruction;Therapeutic activities;Patient/family education;Balance training    OT Goals(Current goals can be found in the care plan section) Acute Rehab OT Goals Patient Stated Goal: none stated OT Goal Formulation: Patient unable to participate in goal setting Time For Goal Achievement: 08/08/22 Potential to Achieve Goals: Fair  OT Frequency: Min 2X/week    Co-evaluation PT/OT/SLP Co-Evaluation/Treatment: Yes Reason for Co-Treatment: Necessary to address cognition/behavior during functional activity PT goals addressed during session: Other (comment) (lymphedema management) OT goals addressed during session: ADL's and self-care      AM-PAC OT "6 Clicks" Daily Activity     Outcome Measure Help from another person eating meals?: A Lot Help from another person taking care of personal grooming?: A Lot Help from another person toileting, which includes using toliet, bedpan, or urinal?: Total Help from another person bathing (including washing, rinsing, drying)?: Total Help from another person to put on and taking off regular upper body clothing?: Total Help from another person to put on and taking off regular lower body clothing?: Total 6 Click Score: 8   End of Session Nurse Communication: Other (comment) (concerns over buildup on tounge)  Activity Tolerance: Patient limited by fatigue;Patient limited by pain Patient left: in bed;with call bell/phone within reach;with bed alarm set  OT Visit Diagnosis: Unsteadiness on feet (R26.81);Muscle weakness (generalized) (M62.81);Pain                Time: 6681-5947 OT Time Calculation (min): 27 min Charges:  OT General Charges $OT Visit: 1 Visit OT Evaluation $OT Eval Moderate Complexity: 1 Mod  Richard Holz OTR/L, MS Acute Rehabilitation Department Office# [phone]   Willa Rough 07/25/2022, 4:16 PM

## 2022-07-25 NOTE — Progress Notes (Signed)
Patient presented to the [facility] [place] department for a possible paracentesis. Limited ultrasound examination of the abdomen revealed only a few small pockets of fluid in the left and right upper quadrants. I am unable to safely access these pockets. No fluid was identified in the lower abdomen.   No procedure performed, primary team made aware. Images saved in Epic.  Soyla Dryer, [place] [phone] 07/25/2022, 10:13 AM

## 2022-07-25 NOTE — Progress Notes (Signed)
Pt's CBG 68 gave 8oz Orange Juice + few bites of lunch (poor oral intake) rechecked CBG - 76. Pt asymptomatic.MD Lonny Prude Will continue with plan of care.

## 2022-07-25 NOTE — Progress Notes (Addendum)
2157- HR 124 with spO2 of 94%. Cpap placed by RT at this time. MEWS ongoing is green when re-assessed following cpap therapy.

## 2022-07-25 NOTE — Progress Notes (Addendum)
PROGRESS NOTE    [name]  WIO:[id number] DOB: 1955-10-28 DOA: 07/17/2022 PCP: [name], MD   Brief Narrative: [name] is a 66 y.o. female with a history of breast cancer in remission, alcohol dependence, alcohol withdrawal/non-alcohol related seizures, CKD stage IIIb. Patient presented secondary to progressive confusion and found to have AKI likely secondary to ATN. Symptoms slowly improved with IV fluids. During admission, patient was also found to have evidence of an incidental pelvic mass; workup started.   Assessment and Plan: * Acute renal failure superimposed on stage 3b chronic kidney disease (HCC) Baseline creatinine of about 1.5-1.6. Creatinine of 6.23 on admission with peak of 6.30. Per nephrology, possible ATN secondary to hypovolemia. Improvement with IV fluids. Creatinine down to 3.90. -BMP in AM  Acute metabolic and toxic encephalopathy Uncertain etiology but possibly multifactorial secondary to hypercarbia, renal failure and gabapentin/Keppra. Patient also with ascites and abdominal pain. Symptoms improving with improvement of renal function and cessation of Keppra.  Pelvic mass and ascites MRI pelvis significant for a right 17 cm adnexal mass. Gynecology/oncology consulted and biopsy performed on 11/21 but was non-diagnostic. Interventional radiology consulted for attempted CT biopsy of peritoneal site. -Follow-up biopsy  Stage 3b chronic kidney disease (CKD) ([place]) Baseline creatinine of 1.5-1.6.  Lymphedema of left arm Chronic. History of breast cancer and worsened secondary to IV fluids.  Generalized pain Chronic issue, although with abdominal pain, mental status change and ascites, will order paracentesis to rule out SBP.  Abnormal thyroid level free T4 Elevated TSH (7.279) with associated elevated free T4 (1.48). Consistent with possible primary disease. -T3  History of seizures Secondary to alcohol withdrawal. Patient was managed on  Keppra which is tapered off secondary to mental status change/lethargy. Depakote started in lieu of Keppra. -Continue Depakote -Outpatient neurology follow-up  Chronic diastolic CHF (congestive heart failure) (HCC) Lasix held. Patient with peripheral edema, however no evidence of cardiogenic etiology.  -Daily weights and strict in/out  Anemia in chronic kidney disease (CKD) Stable.  Morbid obesity with BMI of 50.0-59.9, adult ([place]) Estimated body mass index is 56.69 kg/m as calculated from the following:   Height as of this encounter: '5\' 4"'$  (1.626 m).   Weight as of this encounter: 149.8 kg.  Essential hypertension Patient is on amlodipine and Coreg as an outpatient which are held. Blood pressure mostly controlled.  Hypoglycemia-resolved as of 07/25/2022 On admission. Resolved.  Acute cystitis-resolved as of 07/25/2022 Urine culture significant for Klebsiella pneumoniae. Completed antibiotic therapy.    DVT prophylaxis: Lovenox Code Status:   Code Status: Full Code Family Communication: None at bedside. Called son but no response. Disposition Plan: Discharge pending continued workup/management   Consultants:  Nephrology Gynecology oncology Interventional radiology  Procedures:    Antimicrobials: Ceftriaxone Cefazolin    Subjective: Patient reports no significant issues. Appears to be in generalized pain.  Objective: BP (!) 148/79 (BP Location: Left Leg)   Pulse 100   Temp 98.1 F (36.7 C) (Oral)   Resp 18   Ht '5\' 4"'$  (1.626 m)   Wt (!) 149.8 kg   SpO2 100%   BMI 56.69 kg/m   Examination:  General exam: Appears calm and comfortable Respiratory system: Clear to auscultation. Respiratory effort normal. Cardiovascular system: S1 & S2 heard, RRR. 2/6 systolic murmur. Gastrointestinal system: Abdomen is non-distended, soft and generally tender. Normal bowel sounds heard. Central nervous system: Lethargic and oriented to self only. Tries to follow some  commands.  Musculoskeletal: Bilateral LE and LUE edema. Skin: No  cyanosis. No rashes    Data Reviewed: I have personally reviewed following labs and imaging studies  CBC Lab Results  Component Value Date   WBC 9.8 07/25/2022   RBC 3.34 (L) 07/25/2022   HGB 9.7 (L) 07/25/2022   HCT 31.4 (L) 07/25/2022   MCV 94.0 07/25/2022   MCH 29.0 07/25/2022   PLT 316 07/25/2022   MCHC 30.9 07/25/2022   RDW 15.6 (H) 07/25/2022   LYMPHSABS 1.1 07/21/2022   MONOABS 0.8 07/21/2022   EOSABS 0.1 07/21/2022   BASOSABS 0.0 51/88/4166     Last metabolic panel Lab Results  Component Value Date   NA 139 07/24/2022   K 4.4 07/24/2022   CL 109 07/24/2022   CO2 20 (L) 07/24/2022   BUN 41 (H) 07/24/2022   CREATININE 3.90 (H) 07/24/2022   GLUCOSE 102 (H) 07/24/2022   GFRNONAA 12 (L) 07/24/2022   GFRAA 40 (L) 06/26/2020   CALCIUM 8.7 (L) 07/24/2022   PHOS 4.3 07/23/2022   PROT 7.5 07/18/2022   ALBUMIN 2.6 (L) 07/23/2022   LABGLOB 4.2 (H) 07/18/2022   AGRATIO 0.7 07/18/2022   BILITOT 0.3 07/18/2022   ALKPHOS 38 07/18/2022   AST 17 07/18/2022   ALT 7 07/18/2022   ANIONGAP 10 07/24/2022    GFR: Estimated Creatinine Clearance: 20.8 mL/min (A) (by C-G formula based on SCr of 3.9 mg/dL (H)).  Recent Results (from the past 240 hour(s))  Resp Panel by RT-PCR (Flu A&B, Covid) Anterior Nasal Swab     Status: None   Collection Time: 07/17/22 11:30 PM   Specimen: Anterior Nasal Swab  Result Value Ref Range Status   SARS Coronavirus 2 by RT PCR NEGATIVE NEGATIVE Final    Comment: (NOTE) SARS-CoV-2 target nucleic acids are NOT DETECTED.  The SARS-CoV-2 RNA is generally detectable in upper respiratory specimens during the acute phase of infection. The lowest concentration of SARS-CoV-2 viral copies this assay can detect is 138 copies/mL. A negative result does not preclude SARS-Cov-2 infection and should not be used as the sole basis for treatment or other patient management decisions. A  negative result may occur with  improper specimen collection/handling, submission of specimen other than nasopharyngeal swab, presence of viral mutation(s) within the areas targeted by this assay, and inadequate number of viral copies(<138 copies/mL). A negative result must be combined with clinical observations, patient history, and epidemiological information. The expected result is Negative.  Fact Sheet for Patients:  EntrepreneurPulse.com.au  Fact Sheet for Healthcare Providers:  IncredibleEmployment.be  This test is no t yet approved or cleared by the [place] FDA and  has been authorized for detection and/or diagnosis of SARS-CoV-2 by FDA under an Emergency Use Authorization (EUA). This EUA will remain  in effect (meaning this test can be used) for the duration of the COVID-19 declaration under Section 564(b)(1) of the Act, 21 U.S.C.section 360bbb-3(b)(1), unless the authorization is terminated  or revoked sooner.       Influenza A by PCR NEGATIVE NEGATIVE Final   Influenza B by PCR NEGATIVE NEGATIVE Final    Comment: (NOTE) The Xpert Xpress SARS-CoV-2/FLU/RSV plus assay is intended as an aid in the diagnosis of influenza from Nasopharyngeal swab specimens and should not be used as a sole basis for treatment. Nasal washings and aspirates are unacceptable for Xpert Xpress SARS-CoV-2/FLU/RSV testing.  Fact Sheet for Patients: EntrepreneurPulse.com.au  Fact Sheet for Healthcare Providers: IncredibleEmployment.be  This test is not yet approved or cleared by the [place] and has been authorized  for detection and/or diagnosis of SARS-CoV-2 by FDA under an Emergency Use Authorization (EUA). This EUA will remain in effect (meaning this test can be used) for the duration of the COVID-19 declaration under Section 564(b)(1) of the Act, 21 U.S.C. section 360bbb-3(b)(1), unless the authorization  is terminated or revoked.  Performed at [facility], [place] [address], [place], [place] [address]   Urine Culture     Status: Abnormal   Collection Time: 07/18/22 12:31 AM   Specimen: Urine, Clean Catch  Result Value Ref Range Status   Specimen Description   Final    URINE, CLEAN CATCH Performed at [facility], [place] [address], [place], [place] [address]    Special Requests   Final    NONE Performed at [facility], [place] [address], [place], [place] [address]    Culture >=100,000 COLONIES/mL KLEBSIELLA PNEUMONIAE (A)  Final   Report Status 07/20/2022 FINAL  Final   Organism ID, Bacteria KLEBSIELLA PNEUMONIAE (A)  Final      Susceptibility   Klebsiella pneumoniae - MIC*    AMPICILLIN >=32 RESISTANT Resistant     CEFAZOLIN <=4 SENSITIVE Sensitive     CEFEPIME <=0.12 SENSITIVE Sensitive     CEFTRIAXONE <=0.25 SENSITIVE Sensitive     CIPROFLOXACIN <=0.25 SENSITIVE Sensitive     GENTAMICIN <=1 SENSITIVE Sensitive     IMIPENEM <=0.25 SENSITIVE Sensitive     NITROFURANTOIN <=16 SENSITIVE Sensitive     TRIMETH/SULFA <=20 SENSITIVE Sensitive     AMPICILLIN/SULBACTAM 4 SENSITIVE Sensitive     PIP/TAZO <=4 SENSITIVE Sensitive     * >=100,000 COLONIES/mL KLEBSIELLA PNEUMONIAE  Culture, blood (routine x 2)     Status: None   Collection Time: 07/18/22  1:45 AM   Specimen: BLOOD  Result Value Ref Range Status   Specimen Description   Final    BLOOD BLOOD RIGHT HAND Performed at [place] [address], [place], [place] [address]    Special Requests   Final    BOTTLES DRAWN AEROBIC AND ANAEROBIC Blood Culture results may not be optimal due to an excessive volume of blood received in culture bottles Performed at [place] [address] [place], [place] [address]    Culture   Final    NO GROWTH 5 DAYS Performed at [facility], [place] [address] [place], [place] [address]     Report Status 07/23/2022 FINAL  Final  Culture, blood (routine x 2)     Status: None   Collection Time: 07/18/22  1:45 AM   Specimen: BLOOD  Result Value Ref Range Status   Specimen Description   Final    BLOOD RIGHT ANTECUBITAL Performed at [place] [address], [place], [place] [address]    Special Requests   Final    BOTTLES DRAWN AEROBIC AND ANAEROBIC Blood Culture results may not be optimal due to an excessive volume of blood received in culture bottles Performed at [facility] [address] [place], [place] [address]    Culture   Final    NO GROWTH 5 DAYS Performed at [facility], [place] [address] [place], [place] [address]    Report Status 07/23/2022 FINAL  Final  MRSA Next Gen by PCR, Nasal     Status: None   Collection Time: 07/18/22  5:19 AM   Specimen: Nasal Mucosa; Nasal Swab  Result Value Ref Range Status   MRSA by  PCR Next Gen NOT DETECTED NOT DETECTED Final    Comment: (NOTE) The GeneXpert MRSA Assay (FDA approved for NASAL specimens only), is one component of a comprehensive MRSA colonization surveillance program. It is not intended to diagnose MRSA infection nor to guide or monitor treatment for MRSA infections. Test performance is not FDA approved in patients less than 52 years old. Performed at [facility], [place] [address], [place], [place] [address]       Radiology Studies: [place] ASCITES (ABDOMEN LIMITED)  Result Date: 07/25/2022 CLINICAL DATA:  Evaluate for paracentesis. EXAM: LIMITED ABDOMEN ULTRASOUND FOR ASCITES TECHNIQUE: Limited ultrasound survey for ascites was performed in all four abdominal quadrants. COMPARISON:  CT abdomen 07/18/2022 and MRI pelvis 07/19/2022 FINDINGS: Small amount of fluid in the right upper quadrant near the right hemidiaphragm. No significant ascites in the right lower quadrant. Rounded fluid collection along the left side of the abdomen most likely  associated with the pelvic mass/process. IMPRESSION: Small amount of free fluid in the right upper quadrant of the abdomen. Paracentesis not performed due to the location of the fluid. Electronically Signed   By: Markus Daft M.D.   On: 07/25/2022 14:23      LOS: 7 days    Cordelia Poche, MD [facility] 07/25/2022, 3:17 PM   If 7PM-7AM, please contact night-coverage [email or web address]

## 2022-07-25 NOTE — Progress Notes (Signed)
Physical Therapy Treatment Patient Details Name: [name] MRN: [id number] DOB: Jan 17, 1956 Today's Date: 07/25/2022   History of Present Illness [name] is a 66 y.o. F with MO, BrCA in remission, alcohol dependence in remission since 6 months ago, prior alcohol withdrawal seizure, recent admission 2 weeks prior (recent discharge on 06/29/2022) to this admission, for seizure not related to EtOH, noted to have some encephalomalacia and AoCKD who presented this hospitalization with slowly progressive confusion, falls and sleepiness with Cr 6.3, acute hypercabia with respiratory acidosis, as well as a pelvic mass . Underwent biopsy 11/21 of adnexal mass, and 11/22 IR  for CT guided implant biopsy.    PT Comments    Pt seen for  skin check post lymphedema wrapping done on 07/24/22. Pt with good capillary refill in nail beds and reports decr pain in LUE overall this date, pt denies numbness/tingling or any sensation changes in LUE. Tolerated A/AROM L hand/wrist/elbow. Repositioned L UE with L hand/arm elevation (UE was on pillows however hand in dependent position on PT arrival) and slight shoulder abduction. Pt tol well. Bed widened on R side d/t RN concerns for decr space and pressure RUE.     Recommendations for follow up therapy are one component of a multi-disciplinary discharge planning process, led by the attending physician.  Recommendations may be updated based on patient status, additional functional criteria and insurance authorization.  Follow Up Recommendations  Skilled nursing-short term rehab (<3 hours/day) Can patient physically be transported by private vehicle: No   Assistance Recommended at Discharge Frequent or constant Supervision/Assistance  Patient can return home with the following Two people to help with bathing/dressing/bathroom;Assistance with cooking/housework;Assistance with feeding;Assist for transportation   Equipment Recommendations  None recommended by PT     Recommendations for Other Services       Precautions / Restrictions Precautions Precaution Comments: seizure precautions, and LUE edema Restrictions Weight Bearing Restrictions: No     Mobility  Bed Mobility                    Transfers                        Ambulation/Gait                   Stairs             Wheelchair Mobility    Modified Rankin (Stroke Patients Only)       Balance                                            Cognition Arousal/Alertness: Awake/alert Behavior During Therapy: Flat affect                                   General Comments: pt moaning however followign commands and verbalizing a little; abel to switch attention between RN and PT; follow commands for ankle ROM while taking bites of food and drinking with RN assist        Exercises General Exercises - Upper Extremity Elbow Flexion: AAROM, Left, 10 reps Elbow Extension: AAROM, Left, 10 reps, Limitations Elbow Extension Limitations: discomfort with end range extension Wrist Flexion: AAROM, Left, 5 reps, Supine, Limitations Wrist Flexion Limitations: pain/edema Wrist Extension: AAROM, Left, 5 reps, Supine,  Limitations Wrist Extension Limitations: pain/edema Digit Composite Flexion: AROM, Left, 5 reps, Supine Composite Extension: AROM, 5 reps, Supine, Left General Exercises - Lower Extremity Ankle Circles/Pumps: AAROM, 10 reps (pf manual resistance)    General Comments        Pertinent Vitals/Pain Pain Assessment Pain Assessment: Faces Faces Pain Scale: Hurts even more Pain Location: Lt UE with movement Pain Descriptors / Indicators: Discomfort, Grimacing, Guarding, Moaning Pain Intervention(s): Limited activity within patient's tolerance, Monitored during session, Repositioned    Home Living                          Prior Function            PT Goals (current goals can now be found in the  care plan section) Acute Rehab PT Goals Patient Stated Goal: go home PT Goal Formulation: Patient unable to participate in goal setting Time For Goal Achievement:  Potential to Achieve Goals: Fair Progress towards PT goals: Progressing toward goals    Frequency    Min 2X/week      PT Plan Current plan remains appropriate;Frequency needs to be updated    Co-evaluation              AM-PAC PT "6 Clicks" Mobility   Outcome Measure  Help needed turning from your back to your side while in a flat bed without using bedrails?: Total Help needed moving from lying on your back to sitting on the side of a flat bed without using bedrails?: Total Help needed moving to and from a bed to a chair (including a wheelchair)?: Total Help needed standing up from a chair using your arms (e.g., wheelchair or bedside chair)?: Total Help needed to walk in hospital room?: Total Help needed climbing 3-5 steps with a railing? : Total 6 Click Score: 6    End of Session   Activity Tolerance: Patient tolerated treatment well;Patient limited by pain Patient left: in bed;with call bell/phone within reach;with nursing/sitter in room Nurse Communication: Mobility status PT Visit Diagnosis: Muscle weakness (generalized) (M62.81);Other abnormalities of gait and mobility (R26.89) Pain - Right/Left: Left Pain - part of body: Arm;Hand;Shoulder     Time: 0518-3358 PT Time Calculation (min) (ACUTE ONLY): 16 min  Charges:  $Therapeutic Exercise: 8-22 mins                     Baxter Flattery, PT  Acute Rehab Dept [facility]) [phone]  [facility] Weekend Pager [facility] [place] only)  [phone]  07/25/2022    [facility] 07/25/2022, 12:27 PM

## 2022-07-26 ENCOUNTER — Inpatient Hospital Stay (HOSPITAL_COMMUNITY): Payer: Medicare Other

## 2022-07-26 ENCOUNTER — Encounter (HOSPITAL_COMMUNITY): Payer: Self-pay | Admitting: Internal Medicine

## 2022-07-26 ENCOUNTER — Inpatient Hospital Stay: Payer: Medicare Other | Attending: Hematology and Oncology | Admitting: Hematology and Oncology

## 2022-07-26 DIAGNOSIS — M79669 Pain in unspecified lower leg: Secondary | ICD-10-CM

## 2022-07-26 DIAGNOSIS — N1832 Chronic kidney disease, stage 3b: Secondary | ICD-10-CM | POA: Diagnosis not present

## 2022-07-26 DIAGNOSIS — R1900 Intra-abdominal and pelvic swelling, mass and lump, unspecified site: Secondary | ICD-10-CM | POA: Diagnosis not present

## 2022-07-26 DIAGNOSIS — N179 Acute kidney failure, unspecified: Secondary | ICD-10-CM | POA: Diagnosis not present

## 2022-07-26 DIAGNOSIS — M79605 Pain in left leg: Secondary | ICD-10-CM | POA: Insufficient documentation

## 2022-07-26 DIAGNOSIS — G9341 Metabolic encephalopathy: Secondary | ICD-10-CM | POA: Diagnosis not present

## 2022-07-26 LAB — GLUCOSE, CAPILLARY
Glucose-Capillary: 77 mg/dL (ref 70–99)
Glucose-Capillary: 81 mg/dL (ref 70–99)
Glucose-Capillary: 88 mg/dL (ref 70–99)
Glucose-Capillary: 92 mg/dL (ref 70–99)
Glucose-Capillary: 92 mg/dL (ref 70–99)

## 2022-07-26 LAB — BASIC METABOLIC PANEL
Anion gap: 9 (ref 5–15)
BUN: 43 mg/dL — ABNORMAL HIGH (ref 8–23)
CO2: 20 mmol/L — ABNORMAL LOW (ref 22–32)
Calcium: 8.5 mg/dL — ABNORMAL LOW (ref 8.9–10.3)
Chloride: 109 mmol/L (ref 98–111)
Creatinine, Ser: 3.51 mg/dL — ABNORMAL HIGH (ref 0.44–1.00)
GFR, Estimated: 14 mL/min — ABNORMAL LOW (ref >60)
Glucose, Bld: 89 mg/dL (ref 70–99)
Potassium: 4.9 mmol/L (ref 3.5–5.1)
Sodium: 138 mmol/L (ref 135–145)

## 2022-07-26 MED ORDER — LIDOCAINE HCL (PF) 1 % IJ SOLN
INTRAMUSCULAR | Status: AC | PRN
  Administered 2022-07-26: 10 mL

## 2022-07-26 MED ORDER — SODIUM CHLORIDE 0.9 % IV SOLN
INTRAVENOUS | Status: AC
  Filled 2022-07-26: qty 250

## 2022-07-26 MED ORDER — HYDROMORPHONE HCL 1 MG/ML IJ SOLN
0.5000 mg | INTRAMUSCULAR | Status: DC | PRN
  Administered 2022-07-26 – 2022-07-28 (×5): 0.5 mg via INTRAVENOUS
  Filled 2022-07-26 (×6): qty 0.5

## 2022-07-26 MED ORDER — FENTANYL CITRATE (PF) 100 MCG/2ML IJ SOLN
INTRAMUSCULAR | Status: AC | PRN
  Administered 2022-07-26 (×2): 25 ug via INTRAVENOUS
  Administered 2022-07-26: 50 ug via INTRAVENOUS

## 2022-07-26 MED ORDER — MIDAZOLAM HCL 2 MG/2ML IJ SOLN
INTRAMUSCULAR | Status: AC | PRN
  Administered 2022-07-26: .5 mg via INTRAVENOUS
  Administered 2022-07-26: 1 mg via INTRAVENOUS
  Administered 2022-07-26: .5 mg via INTRAVENOUS

## 2022-07-26 MED ORDER — FENTANYL CITRATE (PF) 100 MCG/2ML IJ SOLN
INTRAMUSCULAR | Status: AC
  Filled 2022-07-26: qty 4

## 2022-07-26 MED ORDER — MIDAZOLAM HCL 2 MG/2ML IJ SOLN
INTRAMUSCULAR | Status: AC
  Filled 2022-07-26: qty 4

## 2022-07-26 NOTE — Sedation Documentation (Signed)
Needle removed - Gelfoam inserted into tract

## 2022-07-26 NOTE — Progress Notes (Signed)
Notified Dr. Teryl Lucy, pt's total output from foley cath is only 100 ml, since beginning of shift. Requested response.

## 2022-07-26 NOTE — Sedation Documentation (Signed)
Sample obtained 

## 2022-07-26 NOTE — Progress Notes (Deleted)
[facility] Follow up:    Sue Pardon, MD [place] [address]   DIAGNOSIS:  Cancer Staging  Malignant neoplasm of upper-outer quadrant of left breast in female, estrogen receptor positive ([place]) Staging form: Breast, AJCC 8th Edition - Clinical stage from 11/21/2019: Stage IIIA (cT2, cN1(f), cM0, G3, ER+, PR-, HER2-) - Unsigned Stage prefix: Initial diagnosis Method of lymph node assessment: Core biopsy Histologic grading system: 3 grade system - Pathologic stage from 12/19/2019: Stage IIA (pT2, pN1a, cM0, G3, ER+, PR+, HER2-) - Signed by Gardenia Phlegm, NP on 01/02/2020 Stage prefix: Initial diagnosis Histologic grading system: 3 grade system   SUMMARY OF ONCOLOGIC HISTORY: 66 y.o. [place] woman status post left breast upper outer quadrant and left axillary lymph node biopsies 11/12/2019 for a clinical T2 N2, stage IIIA invasive ductal carcinoma, grade 3, estrogen and progesterone receptor positive, HER-2 not amplified, with an MIB-1 of 70%             (a) the biopsied axillary lymph node was positive   (1) genetics testing 11/29/2019 through the Breast Cancer STAT Panel offered by [facility] found no deleterious mutations in ATM, BRCA1, BRCA2, CDH1, CHEK2, PALB2, PTEN, STK11 and TP53.  Additional testing through the Common Hereditary Cancers Panel confirmed no deleterious mutations in APC, ATM, AXIN2, BARD1, BMPR1A, BRCA1, BRCA2, BRIP1, CDH1, CDK4, CDKN2A (p14ARF), CDKN2A (p16INK4a), CHEK2, CTNNA1, DICER1, EPCAM (Deletion/duplication testing only), GREM1 (promoter region deletion/duplication testing only), KIT, MEN1, MLH1, MSH2, MSH3, MSH6, MUTYH, NBN, NF1, NHTL1, PALB2, PDGFRA, PMS2, POLD1, POLE, PTEN, RAD50, RAD51C, RAD51D, RNF43, SDHB, SDHC, SDHD, SMAD4, SMARCA4. STK11, TP53, TSC1, TSC2, and VHL.  The following genes were evaluated for sequence changes only: SDHA and HOXB13 c.251G>A variant only.   (2) status post left modified radical  mastectomy 12/19/2019 for a pT2 pN1, stage IIB invasive ductal carcinoma, grade 3, with negative margins.             (a) 1 of 13 axillary lymph nodes removed was positive   (3) adjuvant chemotherapy will consist of cyclophosphamide and doxorubicin in dose dense fashion x4 starting 01/22/2020 followed by weekly paclitaxel x12             (a) baseline echocardiogram 11/26/2019 shows an ejection fraction in the 65-70% range.             (b) first cycle of cyclophosphamide and doxorubicin dose reduced to assess tolerance             (c) chemotherapy discontinued after 2 cycles because of poor tolerance   (4) adjuvant radiation completed 05/21/2020   (5) anastrozole started July 2021    CURRENT THERAPY: Anastrozole  INTERVAL HISTORY: [name] 66 y.o. female returns for follow-up of her history of estrogen and progesterone positive breast cancer.  She tells me that she has missed several appointments because she had swelling in her brain.  Upon review of the notes this was related to acute alcohol withdrawal.  She is doing well today.  She tells me that she is living at home in her apartment which is independent living at [place] and she is working with physical therapy daily to improve her strength since her intubation that occurred at the beginning of this year.  She has significant transportation difficulties because she is unable to stand and walk to self transfer into a car so many times transportation will have to pick her up and she weighs 300 pounds.  She denies any new physical  concerns with her breasts.  Her left chest wall has no skin changes and she denies any lymphadenopathy.   Patient Active Problem List   Diagnosis Date Noted   Abnormal thyroid level free T4 07/22/2022   Generalized pain 07/22/2022   Lymphedema of left arm 07/22/2022   Pelvic mass and ascites 07/20/2022   History of seizures 07/18/2022   HBV (hepatitis B virus) infection 06/24/2022   Seizure ([place])  04/04/2022   Stage 3b chronic kidney disease (CKD) ([place]) 02/04/2022   Liver cirrhosis, alcoholic ([place]) 32/35/5732   Cirrhosis of liver without ascites ([place])    Alcohol withdrawal seizure with delirium ([place]) 08/31/2021   UTI (urinary tract infection) 08/31/2021   Benign neoplasm of colon    Atypical glandular cells on cervical Pap smear 20/25/4270   Acute metabolic and toxic encephalopathy 08/01/2020   COVID-19 virus infection 08/01/2020   COVID-19 08/01/2020   Chronic diastolic CHF (congestive heart failure) ([place]) 06/26/2020   Thoracic aortic aneurysm without rupture ([place]) 06/26/2020   Hyponatremia 05/31/2020   Pancytopenia ([place]) 05/31/2020   Cellulitis of thigh 04/29/2020   Tinea corporis 04/29/2020   Anemia in chronic kidney disease (CKD) 04/29/2020   Cellulitis of abdominal wall    Constipation    Folate deficiency    Hypomagnesemia    Debility    Gout 03/03/2020   Skin breakdown 03/03/2020   Skin ulcer of perineum, limited to breakdown of skin ([place]) 03/03/2020   Leukocytosis 03/03/2020   Anemia associated with chemotherapy 03/03/2020   Thrombocytopenia ([place]) 03/03/2020   Port-A-Cath in place 02/19/2020   Acute renal failure superimposed on stage 3b chronic kidney disease ([place]) 01/22/2020   S/P left mastectomy 12/19/2019   Colon cancer screening 11/30/2019   Morbid obesity with BMI of 50.0-59.9, adult ([place]) 11/21/2019   Family history of ovarian cancer    Malignant neoplasm of upper-outer quadrant of left breast in female, estrogen receptor positive ([place]) 11/14/2019   Fracture of radial head, right, closed 06/25/2019   Frequent falls 01/10/2019   Bilateral bunions 01/10/2019   Toenail fungus 62/37/6283   Alcoholic hepatitis 15/17/6160   Edema 11/20/2018   Alcohol-induced mood disorder ([place]) 11/01/2013   Alcohol dependence ([place]) 11/01/2013   Essential hypertension 06/20/2007   LOW BACK PAIN 06/20/2007   DIVERTICULOSIS, COLON 05/05/2007    is allergic to  amlodipine.  MEDICAL HISTORY: Past Medical History:  Diagnosis Date   Alcohol dependence ([place])    Aortic atherosclerosis ([place])    Arthritis    Cancer ([place]) 11/2019   Left breast   Chronic heart failure (H[facility])    Chronic kidney disease, stage 3 ([place])    COVID-19    Diverticulitis    Family history of ovarian cancer    Heart murmur    11/26/19 echo: Mild AS. AV mean gradient 12.0 mmHg; however, LVOT gradient 8 mmHg with intracvitary gradient-significant AS is not suspected   Hypertension    Right nephrolithiasis    Thoracic aortic aneurysm (H[facility])    Tubular adenoma polyp of rectum     SURGICAL HISTORY: Past Surgical History:  Procedure Laterality Date   COLONOSCOPY WITH PROPOFOL N/A 02/24/2021   Procedure: COLONOSCOPY WITH PROPOFOL;  Surgeon: Yetta Flock, MD;  Location: [facility];  Service: Gastroenterology;  Laterality: N/A;   MASTECTOMY WITH AXILLARY LYMPH NODE DISSECTION Left 12/19/2019   Procedure: LEFT MASTECTOMY WITH AXILLARY LYMPH NODE DISSECTION;  Surgeon: [name], MD;  Location: [facility];  Service: General;  Laterality: Left;   MULTIPLE EXTRACTIONS WITH  ALVEOLOPLASTY Bilateral 12/14/2019   Procedure: MULTIPLE EXTRACTION WITH ALVEOLOPLASTY;  Surgeon: [name], DDS;  Location: [facility];  Service: Oral Surgery;  Laterality: Bilateral;   POLYPECTOMY  02/24/2021   Procedure: POLYPECTOMY;  Surgeon: Yetta Flock, MD;  Location: [facility];  Service: Gastroenterology;;   Sol Passer PLACEMENT N/A 12/19/2019   Procedure: INSERTION PORT-A-CATH WITH ULTRASOUND GUIDANCE;  Surgeon: [name], MD;  Location: [facility];  Service: General;  Laterality: N/A;   RADIAL HEAD ARTHROPLASTY Right 06/25/2019   Procedure: RIGHT RADIAL HEAD ARTHROPLASTY;  Surgeon: [name], MD;  Location: [facility];  Service: Orthopedics;  Laterality: Right;   TUBAL LIGATION      SOCIAL HISTORY: Social History   Socioeconomic History   Marital status: Legally Separated    Spouse  name: Not on file   Number of children: Not on file   Years of education: Not on file   Highest education level: Not on file  Occupational History   Not on file  Tobacco Use   Smoking status: Never   Smokeless tobacco: Never  Vaping Use   Vaping Use: Never used  Substance and Sexual Activity   Alcohol use: Yes    Alcohol/week: 3.0 standard drinks of alcohol    Types: 1 Cans of beer, 2 Shots of liquor per week    Comment: daily   Drug use: Yes    Types: Marijuana   Sexual activity: Not Currently  Other Topics Concern   Not on file  Social History Narrative   Grand daughter stays with her   Mrs Zylka cares for her 34 developmental delayed daughter [name] with mental health issues at home - Previous was at a group home   Social Determinants of Health   Financial Resource Strain: Low Risk  (07/04/2020)   Overall Financial Resource Strain (CARDIA)    Difficulty of Paying Living Expenses: Not hard at all  Food Insecurity: No Food Insecurity (07/18/2022)   Hunger Vital Sign    Worried About Running Out of Food in the Last Year: Never true    [place] in the Last Year: Never true  Transportation Needs: No Transportation Needs (07/18/2022)   PRAPARE - Hydrologist (Medical): No    Lack of Transportation (Non-Medical): No  Physical Activity: Not on file  Stress: Stress Concern Present (12/10/2021)   [place]    Feeling of Stress : Rather much  Social Connections: Moderately Integrated (12/10/2021)   Social Connection and Isolation Panel [NHANES]    Frequency of Communication with Friends and Family: More than three times a week    Frequency of Social Gatherings with Friends and Family: More than three times a week    Attends Religious Services: 1 to 4 times per year    Active Member of [employer] or Organizations: Yes    Attends Archivist Meetings: 1 to 4 times per year     Marital Status: Separated  Intimate Partner Violence: Not At Risk (07/18/2022)   Humiliation, Afraid, Rape, and Kick questionnaire    Fear of Current or Ex-Partner: No    Emotionally Abused: No    Physically Abused: No    Sexually Abused: No    FAMILY HISTORY: Family History  Problem Relation Age of Onset   Hypertension Mother    Dementia Mother    Ovarian cancer Half-Sister 66   Diabetes Half-Sister    Stroke Half-Sister  Review of Systems  Constitutional:  Negative for appetite change, chills, fatigue, fever and unexpected weight change.  HENT:   Negative for hearing loss, lump/mass and trouble swallowing.   Eyes:  Negative for eye problems and icterus.  Respiratory:  Negative for chest tightness, cough and shortness of breath.   Cardiovascular:  Negative for chest pain, leg swelling and palpitations.  Gastrointestinal:  Negative for abdominal distention, abdominal pain, constipation, diarrhea, nausea and vomiting.  Endocrine: Negative for hot flashes.  Genitourinary:  Negative for difficulty urinating.   Musculoskeletal:  Negative for arthralgias.  Skin:  Negative for itching and rash.  Neurological:  Negative for dizziness, extremity weakness, headaches and numbness.  Hematological:  Negative for adenopathy. Does not bruise/bleed easily.  Psychiatric/Behavioral:  Negative for depression. The patient is not nervous/anxious.       PHYSICAL EXAMINATION  ECOG PERFORMANCE STATUS: 4 - Bedbound  There were no vitals filed for this visit.   Physical Exam Constitutional:      General: She is not in acute distress.    Appearance: Normal appearance. She is not toxic-appearing.  HENT:     Head: Normocephalic and atraumatic.  Eyes:     General: No scleral icterus. Cardiovascular:     Rate and Rhythm: Normal rate and regular rhythm.     Pulses: Normal pulses.     Heart sounds: Normal heart sounds.  Pulmonary:     Effort: Pulmonary effort is normal.     Breath sounds:  Normal breath sounds.  Chest:     Comments: Examined in wheelchair therefore limited exam, left chest wall has no nodules or sign of recurrence right breast is benign. Abdominal:     General: Abdomen is flat. Bowel sounds are normal. There is no distension.     Palpations: Abdomen is soft.     Tenderness: There is no abdominal tenderness.  Musculoskeletal:        General: No swelling.     Cervical back: Neck supple.  Lymphadenopathy:     Cervical: No cervical adenopathy.  Skin:    General: Skin is warm and dry.     Findings: No rash.  Neurological:     General: No focal deficit present.     Mental Status: She is alert.  Psychiatric:        Mood and Affect: Mood normal.        Behavior: Behavior normal.     LABORATORY DATA:  CBC    Component Value Date/Time   WBC 9.8 07/25/2022 0522   RBC 3.34 (L) 07/25/2022 0522   HGB 9.7 (L) 07/25/2022 0522   HGB 9.8 (L) 10/16/2020 1547   HCT 31.4 (L) 07/25/2022 0522   HCT 29.8 (L) 10/16/2020 1547   PLT 316 07/25/2022 0522   PLT 236 10/16/2020 1547   MCV 94.0 07/25/2022 0522   MCV 94 10/16/2020 1547   MCH 29.0 07/25/2022 0522   MCHC 30.9 07/25/2022 0522   RDW 15.6 (H) 07/25/2022 0522   RDW 14.0 10/16/2020 1547   LYMPHSABS 1.1 07/21/2022 0238   LYMPHSABS 1.5 03/25/2020 1103   MONOABS 0.8 07/21/2022 0238   EOSABS 0.1 07/21/2022 0238   EOSABS 0.4 03/25/2020 1103   BASOSABS 0.0 07/21/2022 0238   BASOSABS 0.0 03/25/2020 1103    CMP     Component Value Date/Time   NA 138 07/26/2022 0404   NA 138 06/26/2020 0843   K 4.9 07/26/2022 0404   CL 109 07/26/2022 0404   CO2 20 (L) 07/26/2022 0404  GLUCOSE 89 07/26/2022 0404   BUN 43 (H) 07/26/2022 0404   BUN 23 06/26/2020 0843   CREATININE 3.51 (H) 07/26/2022 0404   CREATININE 1.59 (H) 11/21/2019 0908   CALCIUM 8.5 (L) 07/26/2022 0404   PROT 6.7 07/25/2022 2141   PROT 8.5 06/15/2019 1622   ALBUMIN 2.4 (L) 07/25/2022 2141   ALBUMIN 4.3 06/15/2019 1622   AST 16 07/25/2022 2141    AST 61 (H) 11/21/2019 0908   ALT <5 07/25/2022 2141   ALT 32 11/21/2019 0908   ALKPHOS 44 07/25/2022 2141   BILITOT 0.4 07/25/2022 2141   BILITOT 0.5 11/21/2019 0908   GFRNONAA 14 (L) 07/26/2022 0404   GFRNONAA 34 (L) 11/21/2019 0908   GFRAA 40 (L) 06/26/2020 0843   GFRAA 39 (L) 11/21/2019 0908      ASSESSMENT and THERAPY PLAN:   No problem-specific Assessment & Plan notes found for this encounter.   All questions were answered. The patient knows to call the clinic with any problems, questions or concerns. We can certainly see the patient much sooner if necessary.  Total encounter time:30 minutes*in face-to-face visit time, chart review, lab review, care coordination, order entry, and documentation of the encounter time.   Wilber Bihari, NP 07/26/22 10:43 AM Medical Oncology and Hematology [facility] [place], [place] [address] Tel. [phone]    Fax. [phone]  *Total Encounter Time as defined by the Centers for Medicare and Medicaid Services includes, in addition to the face-to-face time of a patient visit (documented in the note above) non-face-to-face time: obtaining and reviewing outside history, ordering and reviewing medications, tests or procedures, care coordination (communications with other health care professionals or caregivers) and documentation in the medical record.

## 2022-07-26 NOTE — Progress Notes (Signed)
PROGRESS NOTE    [name]  HYQ:[id number] DOB: [birth year] DOA: 07/17/2022 PCP: [name], MD   Brief Narrative: [name] is a 66 y.o. female with a history of breast cancer in remission, alcohol dependence, alcohol withdrawal/non-alcohol related seizures, CKD stage IIIb. Patient presented secondary to progressive confusion and found to have AKI likely secondary to ATN. Symptoms slowly improved with IV fluids. During admission, patient was also found to have evidence of an incidental pelvic mass; workup started.   Assessment and Plan: * Acute renal failure superimposed on stage 3b chronic kidney disease (HCC) Baseline creatinine of about 1.5-1.6. Creatinine of 6.23 on admission with peak of 6.30. Per nephrology, possible ATN secondary to hypovolemia. Improvement with IV fluids. Creatinine down to 3.43 but now slightly increased to 3.51 this morning. IV fluids still running at low rate. -Repeat BMP in AM  Acute metabolic and toxic encephalopathy Uncertain etiology but possibly multifactorial secondary to hypercarbia, renal failure and gabapentin/Keppra. Patient also with ascites and abdominal pain. Symptoms improving with improvement of renal function and cessation of Keppra.  Pelvic mass and ascites MRI pelvis significant for a right 17 cm adnexal mass. Gynecology/oncology consulted and biopsy performed on 11/21 but was non-diagnostic. US paracentesis ordered but not completed secondary to inadequate fluid. Interventional radiology consulted for attempted CT biopsy of peritoneal site which was performed on 11/27 -Follow-up biopsy pathology results  Stage 3b chronic kidney disease (CKD) ([place]) Baseline creatinine of 1.5-1.6.  Bilateral leg pain Unclear etiology. Generalized swelling. Tender. -LE venous duplex  Lymphedema of left arm Chronic. History of breast cancer and worsened secondary to IV fluids.  Generalized pain Chronic issue, although with abdominal pain,  mental status change and ascites, will order paracentesis to rule out SBP.  Abnormal thyroid level free T4 Elevated TSH (7.279) with associated elevated free T4 (1.48). Consistent with possible primary disease. -T3 pending  History of seizures Secondary to alcohol withdrawal. Patient was managed on Keppra which is tapered off secondary to mental status change/lethargy. Depakote started in lieu of Keppra. -Continue Depakote -Outpatient neurology follow-up  Chronic diastolic CHF (congestive heart failure) (HCC) Lasix held. Patient with peripheral edema, however no evidence of cardiogenic etiology. Weight appears to be increasing steadily. Overall, patient is net positive 12.6 L. -Daily weights and strict in/out -May need to consider diuresis but concerning secondary to recent AKI  Anemia in chronic kidney disease (CKD) Stable.  Morbid obesity with BMI of 50.0-59.9, adult ([place]) Estimated body mass index is 56.69 kg/m as calculated from the following:   Height as of this encounter: '5\' 4"'$  (1.626 m).   Weight as of this encounter: 149.8 kg.  Essential hypertension Patient is on amlodipine and Coreg as an outpatient which are held. Blood pressure mostly controlled.  Hypoglycemia-resolved as of 07/25/2022 On admission. Resolved.  Acute cystitis-resolved as of 07/25/2022 Urine culture significant for Klebsiella pneumoniae. Completed antibiotic therapy.    DVT prophylaxis: Lovenox Code Status:   Code Status: Full Code Family Communication: None at bedside. Called son but no response. Disposition Plan: Discharge pending continued workup/management   Consultants:  Nephrology Gynecology oncology Interventional radiology  Procedures:    Antimicrobials: Ceftriaxone Cefazolin    Subjective: Patient is recently back from biopsy and reports no pain at this time.  Objective: BP 108/69 (BP Location: Right Arm)   Pulse 99   Temp (!) 97 F (36.1 C)   Resp 16   Ht '5\' 4"'$   (1.626 m)   Wt (!) 153 kg   SpO2  100%   BMI 57.90 kg/m   Examination:  General exam: Appears calm and comfortable Respiratory system: Clear to auscultation. Respiratory effort normal. Cardiovascular system: S1 & S2 heard, RRR. Gastrointestinal system: Abdomen is nondistended, soft and nontender. Normal bowel sounds heard. Central nervous system: Alert and oriented to person and place. Musculoskeletal: BLE and LUE edema. No calf tenderness Skin: No cyanosis. No rashes   Data Reviewed: I have personally reviewed following labs and imaging studies  CBC Lab Results  Component Value Date   WBC 9.8 07/25/2022   RBC 3.34 (L) 07/25/2022   HGB 9.7 (L) 07/25/2022   HCT 31.4 (L) 07/25/2022   MCV 94.0 07/25/2022   MCH 29.0 07/25/2022   PLT 316 07/25/2022   MCHC 30.9 07/25/2022   RDW 15.6 (H) 07/25/2022   LYMPHSABS 1.1 07/21/2022   MONOABS 0.8 07/21/2022   EOSABS 0.1 07/21/2022   BASOSABS 0.0 18/84/1660     Last metabolic panel Lab Results  Component Value Date   NA 138 07/26/2022   K 4.9 07/26/2022   CL 109 07/26/2022   CO2 20 (L) 07/26/2022   BUN 43 (H) 07/26/2022   CREATININE 3.51 (H) 07/26/2022   GLUCOSE 89 07/26/2022   GFRNONAA 14 (L) 07/26/2022   GFRAA 40 (L) 06/26/2020   CALCIUM 8.5 (L) 07/26/2022   PHOS 4.3 07/23/2022   PROT 6.7 07/25/2022   ALBUMIN 2.4 (L) 07/25/2022   LABGLOB 4.2 (H) 07/18/2022   AGRATIO 0.7 07/18/2022   BILITOT 0.4 07/25/2022   ALKPHOS 44 07/25/2022   AST 16 07/25/2022   ALT <5 07/25/2022   ANIONGAP 9 07/26/2022    GFR: Estimated Creatinine Clearance: 23.4 mL/min (A) (by C-G formula based on SCr of 3.51 mg/dL (H)).  Recent Results (from the past 240 hour(s))  Resp Panel by RT-PCR (Flu A&B, Covid) Anterior Nasal Swab     Status: None   Collection Time: 07/17/22 11:30 PM   Specimen: Anterior Nasal Swab  Result Value Ref Range Status   SARS Coronavirus 2 by RT PCR NEGATIVE NEGATIVE Final    Comment: (NOTE) SARS-CoV-2 target nucleic  acids are NOT DETECTED.  The SARS-CoV-2 RNA is generally detectable in upper respiratory specimens during the acute phase of infection. The lowest concentration of SARS-CoV-2 viral copies this assay can detect is 138 copies/mL. A negative result does not preclude SARS-Cov-2 infection and should not be used as the sole basis for treatment or other patient management decisions. A negative result may occur with  improper specimen collection/handling, submission of specimen other than nasopharyngeal swab, presence of viral mutation(s) within the areas targeted by this assay, and inadequate number of viral copies(<138 copies/mL). A negative result must be combined with clinical observations, patient history, and epidemiological information. The expected result is Negative.  Fact Sheet for Patients:  EntrepreneurPulse.com.au  Fact Sheet for Healthcare Providers:  IncredibleEmployment.be  This test is no t yet approved or cleared by the [place] FDA and  has been authorized for detection and/or diagnosis of SARS-CoV-2 by FDA under an Emergency Use Authorization (EUA). This EUA will remain  in effect (meaning this test can be used) for the duration of the COVID-19 declaration under Section 564(b)(1) of the Act, 21 U.S.C.section 360bbb-3(b)(1), unless the authorization is terminated  or revoked sooner.       Influenza A by PCR NEGATIVE NEGATIVE Final   Influenza B by PCR NEGATIVE NEGATIVE Final    Comment: (NOTE) The Xpert Xpress SARS-CoV-2/FLU/RSV plus assay is intended as an aid in  the diagnosis of influenza from Nasopharyngeal swab specimens and should not be used as a sole basis for treatment. Nasal washings and aspirates are unacceptable for Xpert Xpress SARS-CoV-2/FLU/RSV testing.  Fact Sheet for Patients: EntrepreneurPulse.com.au  Fact Sheet for Healthcare Providers: IncredibleEmployment.be  This  test is not yet approved or cleared by the [place] FDA and has been authorized for detection and/or diagnosis of SARS-CoV-2 by FDA under an Emergency Use Authorization (EUA). This EUA will remain in effect (meaning this test can be used) for the duration of the COVID-19 declaration under Section 564(b)(1) of the Act, 21 U.S.C. section 360bbb-3(b)(1), unless the authorization is terminated or revoked.  Performed at [facility], [place] [address] [place], [place] [address]   Urine Culture     Status: Abnormal   Collection Time: 07/18/22 12:31 AM   Specimen: Urine, Clean Catch  Result Value Ref Range Status   Specimen Description   Final    URINE, CLEAN CATCH Performed at [facility], [place] [address] [place], [place] [address]    Special Requests   Final    NONE Performed at [facility], [place] [employer] [address] [place], [place] [address]    Culture >=100,000 COLONIES/mL KLEBSIELLA PNEUMONIAE (A)  Final   Report Status 07/20/2022 FINAL  Final   Organism ID, Bacteria KLEBSIELLA PNEUMONIAE (A)  Final      Susceptibility   Klebsiella pneumoniae - MIC*    AMPICILLIN >=32 RESISTANT Resistant     CEFAZOLIN <=4 SENSITIVE Sensitive     CEFEPIME <=0.12 SENSITIVE Sensitive     CEFTRIAXONE <=0.25 SENSITIVE Sensitive     CIPROFLOXACIN <=0.25 SENSITIVE Sensitive     GENTAMICIN <=1 SENSITIVE Sensitive     IMIPENEM <=0.25 SENSITIVE Sensitive     NITROFURANTOIN <=16 SENSITIVE Sensitive     TRIMETH/SULFA <=20 SENSITIVE Sensitive     AMPICILLIN/SULBACTAM 4 SENSITIVE Sensitive     PIP/TAZO <=4 SENSITIVE Sensitive     * >=100,000 COLONIES/mL KLEBSIELLA PNEUMONIAE  Culture, blood (routine x 2)     Status: None   Collection Time: 07/18/22  1:45 AM   Specimen: BLOOD  Result Value Ref Range Status   Specimen Description   Final    BLOOD BLOOD RIGHT HAND Performed at [place] [address] [place], [place] [address]     Special Requests   Final    BOTTLES DRAWN AEROBIC AND ANAEROBIC Blood Culture results may not be optimal due to an excessive volume of blood received in culture bottles Performed at [place] [address] [place], [place] [address]    Culture   Final    NO GROWTH 5 DAYS Performed at [facility], [place] [address], [place], [place] [address]    Report Status 07/23/2022 FINAL  Final  Culture, blood (routine x 2)     Status: None   Collection Time: 07/18/22  1:45 AM   Specimen: BLOOD  Result Value Ref Range Status   Specimen Description   Final    BLOOD RIGHT ANTECUBITAL Performed at [place] [address] [place], [place] [address]    Special Requests   Final    BOTTLES DRAWN AEROBIC AND ANAEROBIC Blood Culture results may not be optimal due to an excessive volume of blood received in culture bottles Performed at [place] [address], [place], [place] [address]    Culture   Final    NO GROWTH 5  DAYS Performed at [facility], [place] [address], [place], [place] [address]    Report Status 07/23/2022 FINAL  Final  MRSA Next Gen by PCR, Nasal     Status: None   Collection Time: 07/18/22  5:19 AM   Specimen: Nasal Mucosa; Nasal Swab  Result Value Ref Range Status   MRSA by PCR Next Gen NOT DETECTED NOT DETECTED Final    Comment: (NOTE) The GeneXpert MRSA Assay (FDA approved for NASAL specimens only), is one component of a comprehensive MRSA colonization surveillance program. It is not intended to diagnose MRSA infection nor to guide or monitor treatment for MRSA infections. Test performance is not FDA approved in patients less than 85 years old. Performed at [facility], [place] [address] [place], [place] [address]       Radiology Studies: CT ABDOMINAL MASS BIOPSY  Result Date: 07/26/2022 CLINICAL DATA:  Large pelvic mass extending into the lower abdomen. EXAM: CT GUIDED  CORE BIOPSY OF PELVIC MASS ANESTHESIA/SEDATION: Moderate (conscious) sedation was employed during this procedure. A total of Versed 2.0 mg and Fentanyl 100 mcg was administered intravenously by radiology nursing. Moderate Sedation Time: 21 minutes. The patient's level of consciousness and vital signs were monitored continuously by radiology nursing throughout the procedure under my direct supervision. PROCEDURE: The procedure risks, benefits, and alternatives were explained to the patient. Questions regarding the procedure were encouraged and answered. The patient understands and consents to the procedure. A time-out was performed prior to initiating the procedure. CT was performed through the lower abdomen and pelvis in a supine position. The right lower abdominal wall was prepped with chlorhexidine in a sterile fashion, and a sterile drape was applied covering the operative field. A sterile gown and sterile gloves were used for the procedure. Local anesthesia was provided with 1% Lidocaine. Under CT guidance, a 17 gauge trocar needle was advanced into the right side of the pelvis from an anterior right-sided approach. After confirming needle tip position, several coaxial 18 gauge core biopsy samples were obtained and submitted in formalin. Prior to removing the outer trocar needle, a slurry of Gel-Foam pledgets was injected through the needle as it was retracted and removed. RADIATION DOSE REDUCTION: This exam was performed according to the departmental dose-optimization program which includes automated exposure control, adjustment of the mA and/or kV according to patient size and/or use of iterative reconstruction technique. COMPLICATIONS: None FINDINGS: Large pelvic mass is again noted which is more centered to the right of midline but also extends across the left of midline. Right-sided approach was taken for biopsy with core biopsy yielding fragments of solid tissue. IMPRESSION: CT-guided core biopsy performed  of a large pelvic mass. Electronically Signed   By: Aletta Edouard M.D.   On: 07/26/2022 11:23   [place] ASCITES (ABDOMEN LIMITED)  Result Date: 07/25/2022 CLINICAL DATA:  Evaluate for paracentesis. EXAM: LIMITED ABDOMEN ULTRASOUND FOR ASCITES TECHNIQUE: Limited ultrasound survey for ascites was performed in all four abdominal quadrants. COMPARISON:  CT abdomen 07/18/2022 and MRI pelvis 07/19/2022 FINDINGS: Small amount of fluid in the right upper quadrant near the right hemidiaphragm. No significant ascites in the right lower quadrant. Rounded fluid collection along the left side of the abdomen most likely associated with the pelvic mass/process. IMPRESSION: Small amount of free fluid in the right upper quadrant of the abdomen. Paracentesis not performed due to the location of the fluid. Electronically Signed   By: Markus Daft M.D.   On: 07/25/2022 14:23  LOS: 8 days    Cordelia Poche, MD [facility] 07/26/2022, 3:31 PM   If 7PM-7AM, please contact night-coverage [email or web address]

## 2022-07-26 NOTE — Progress Notes (Signed)
Gynecologic Oncology Progress Note  Patient underwent a VIR CT-guided biopsy today of pelvic mass. Core biopsy yielding fragments of solid tissue. Will follow-up the results and discuss with patient/family when available and discuss next steps as indicated.

## 2022-07-26 NOTE — Progress Notes (Signed)
R/t inadequate output, confirmed no foley kinks, bladder scanned for 0 ml, & informed Dr. Lonny Prude. Peri - care provided

## 2022-07-26 NOTE — Progress Notes (Signed)
BLE venous duplex has been completed.  Limited study due to habitus and patient condition.   Results can be found under chart review under CV PROC. 07/26/2022 4:37 PM Natividad Halls RVT, RDMS

## 2022-07-26 NOTE — Progress Notes (Signed)
Physical Therapy Treatment Patient Details Name: [name] MRN: [id number] DOB: 1956/05/09 Today's Date: 07/26/2022   History of Present Illness [name] is a 66 y.o. F with MO, BrCA in remission, alcohol dependence in remission since 6 months ago, prior alcohol withdrawal seizure, recent admission 2 weeks prior (recent discharge on 06/29/2022) to this admission, for seizure not related to EtOH, noted to have some encephalomalacia and AoCKD who presented this hospitalization with slowly progressive confusion, falls and sleepiness with Cr 6.3, acute hypercabia with respiratory acidosis, as well as a pelvic mass . Underwent biopsy 11/21 of adnexal mass, and 11/22 IR  for CT guided implant biopsy.    PT Comments    Patient seen for lymphedema wrap check, but she had pulled off the wraps even after RN reports she had reinforced it.  Feel she is too confused and unable to tolerate wraps at this time.  RN reports dealing with other medical issues including decreased UOP.  PT will follow up for appropriateness for lymphedema management.  Left wraps in cabinet above sink in her room.    Recommendations for follow up therapy are one component of a multi-disciplinary discharge planning process, led by the attending physician.  Recommendations may be updated based on patient status, additional functional criteria and insurance authorization.  Follow Up Recommendations  Skilled nursing-short term rehab (<3 hours/day)     Assistance Recommended at Discharge Frequent or constant Supervision/Assistance  Patient can return home with the following Two people to help with bathing/dressing/bathroom;Assistance with cooking/housework;Assistance with feeding;Assist for transportation   Equipment Recommendations  None recommended by PT    Recommendations for Other Services       Precautions / Restrictions       Mobility  Bed Mobility Overal bed mobility: Needs Assistance             General  bed mobility comments: Total A to scoot up in bed    Transfers                        Ambulation/Gait                   Stairs             Wheelchair Mobility    Modified Rankin (Stroke Patients Only)       Balance                                            Cognition Arousal/Alertness: Lethargic, Suspect due to medications Behavior During Therapy: Restless Overall Cognitive Status: Difficult to assess                                 General Comments: moaning only, not responding to questions about wrapping        Exercises      General Comments        Pertinent Vitals/Pain Pain Assessment Pain Assessment: Faces Pain Score: 0-No pain Pain Location: pt asleep    Home Living                          Prior Function            PT Goals (current goals can now be found in the care plan section)  Progress towards PT goals: Not progressing toward goals - comment    Frequency    Min 2X/week      PT Plan Current plan remains appropriate    Co-evaluation              AM-PAC PT "6 Clicks" Mobility   Outcome Measure  Help needed turning from your back to your side while in a flat bed without using bedrails?: Total Help needed moving from lying on your back to sitting on the side of a flat bed without using bedrails?: Total Help needed moving to and from a bed to a chair (including a wheelchair)?: Total Help needed standing up from a chair using your arms (e.g., wheelchair or bedside chair)?: Total Help needed to walk in hospital room?: Total Help needed climbing 3-5 steps with a railing? : Total 6 Click Score: 6    End of Session   Activity Tolerance: Treatment limited secondary to medical complications (Comment) Patient left: in bed   PT Visit Diagnosis: Muscle weakness (generalized) (M62.81);Other abnormalities of gait and mobility (R26.89)     Time: 4801-6553 PT Time  Calculation (min) (ACUTE ONLY): 12 min  Charges:  $Therapeutic Activity: 8-22 mins                     [name], PT Acute Rehabilitation Services [phone] 07/26/2022    [name] 07/26/2022, 5:02 PM

## 2022-07-26 NOTE — Procedures (Signed)
Interventional Radiology Procedure Note  Procedure: US Guided Biopsy of abdominopelvic mass  Complications: None  Estimated Blood Loss: < 10 mL  Findings: 18 G core biopsy of pelvic mass performed under CT guidance.  Four core samples obtained and sent to Pathology.  Venetia Night. Kathlene Cote, M.D Pager:  [phone]

## 2022-07-26 NOTE — Assessment & Plan Note (Signed)
Unclear etiology. Generalized swelling. Tender. -LE venous duplex

## 2022-07-27 DIAGNOSIS — N179 Acute kidney failure, unspecified: Secondary | ICD-10-CM | POA: Diagnosis not present

## 2022-07-27 DIAGNOSIS — N1832 Chronic kidney disease, stage 3b: Secondary | ICD-10-CM | POA: Diagnosis not present

## 2022-07-27 DIAGNOSIS — G9341 Metabolic encephalopathy: Secondary | ICD-10-CM | POA: Diagnosis not present

## 2022-07-27 DIAGNOSIS — R946 Abnormal results of thyroid function studies: Secondary | ICD-10-CM | POA: Diagnosis not present

## 2022-07-27 LAB — COMPREHENSIVE METABOLIC PANEL
ALT: <5 U/L (ref 0–44)
AST: 16 U/L (ref 15–41)
Albumin: 2.4 g/dL — ABNORMAL LOW (ref 3.5–5.0)
Alkaline Phosphatase: 46 U/L (ref 38–126)
Anion gap: 11 (ref 5–15)
BUN: 42 mg/dL — ABNORMAL HIGH (ref 8–23)
CO2: 19 mmol/L — ABNORMAL LOW (ref 22–32)
Calcium: 8.6 mg/dL — ABNORMAL LOW (ref 8.9–10.3)
Chloride: 109 mmol/L (ref 98–111)
Creatinine, Ser: 3.37 mg/dL — ABNORMAL HIGH (ref 0.44–1.00)
GFR, Estimated: 14 mL/min — ABNORMAL LOW (ref >60)
Glucose, Bld: 91 mg/dL (ref 70–99)
Potassium: 4.8 mmol/L (ref 3.5–5.1)
Sodium: 139 mmol/L (ref 135–145)
Total Bilirubin: 0.4 mg/dL (ref 0.3–1.2)
Total Protein: 6.6 g/dL (ref 6.5–8.1)

## 2022-07-27 LAB — CBC
HCT: 28.8 % — ABNORMAL LOW (ref 36.0–46.0)
Hemoglobin: 8.9 g/dL — ABNORMAL LOW (ref 12.0–15.0)
MCH: 29 pg (ref 26.0–34.0)
MCHC: 30.9 g/dL (ref 30.0–36.0)
MCV: 93.8 fL (ref 80.0–100.0)
Platelets: 357 10*3/uL (ref 150–400)
RBC: 3.07 MIL/uL — ABNORMAL LOW (ref 3.87–5.11)
RDW: 15.9 % — ABNORMAL HIGH (ref 11.5–15.5)
WBC: 12 10*3/uL — ABNORMAL HIGH (ref 4.0–10.5)
nRBC: 0 % (ref 0.0–0.2)

## 2022-07-27 LAB — GLUCOSE, CAPILLARY
Glucose-Capillary: 73 mg/dL (ref 70–99)
Glucose-Capillary: 81 mg/dL (ref 70–99)
Glucose-Capillary: 82 mg/dL (ref 70–99)
Glucose-Capillary: 83 mg/dL (ref 70–99)
Glucose-Capillary: 88 mg/dL (ref 70–99)
Glucose-Capillary: 90 mg/dL (ref 70–99)
Glucose-Capillary: 93 mg/dL (ref 70–99)

## 2022-07-27 LAB — T3: T3, Total: 49 ng/dL — ABNORMAL LOW (ref 71–180)

## 2022-07-27 MED ORDER — OXYCODONE HCL 5 MG PO TABS
5.0000 mg | ORAL_TABLET | ORAL | Status: DC | PRN
  Administered 2022-07-28 – 2022-08-01 (×5): 10 mg via ORAL
  Filled 2022-07-27 (×6): qty 2

## 2022-07-27 NOTE — TOC Progression Note (Signed)
Transition of Care [facility]) - Progression Note    Patient Details  Name: [name] MRN: [id number] Date of Birth: [birth year]  Transition of Care [facility] CM/SW Contact  Jericho Cieslik, Juliann Pulse, RN Phone Number: 07/27/2022, 12:48 PM  Clinical Narrative: Faxed out await bed offers.      Expected Discharge Plan: [place] Barriers to Discharge: Continued Medical Work up  Expected Discharge Plan and Services Expected Discharge Plan: [place]                                               Social Determinants of Health (SDOH) Interventions    Readmission Risk Interventions    07/19/2022    9:41 AM 09/07/2021   11:26 AM 03/06/2020   10:03 AM  Readmission Risk Prevention Plan  Transportation Screening Complete Complete Complete  PCP or Specialist Appt within 3-5 Days   Not Complete  Not Complete comments   Unsure of dc date at this time  [place] or [place]   Complete  Social Work Consult for [place] Planning/Counseling   Complete  Palliative Care Screening   Not Applicable  Medication Review [employer] photographer) Complete Complete Complete  PCP or Specialist appointment within 3-5 days of discharge Complete Complete   HRI or Home Care Consult Complete Complete   SW Recovery Care/Counseling Consult Complete Complete   Palliative Care Screening Not Applicable Not [place] Not Applicable Complete

## 2022-07-27 NOTE — NC FL2 (Signed)
[place] LEVEL OF CARE SCREENING TOOL     IDENTIFICATION  Patient Name: [name] Birthdate: [birth year] Sex: female Admission Date (Current Location): 07/17/2022  [place] Number:  Kathleen Argue  ([id number]) Facility and Address:  [facility],  [address] [place], [place]      Provider Number: [id number]  Attending Physician Name and Address:  Mariel Aloe, MD  Relative Name and Phone Number:   [facility] BOFBP(ZWC)[phone])    Current Level of Care: Hospital Recommended Level of Care: [place] Prior Approval Number:    Date Approved/Denied:   PASRR Number:  [id number])  Discharge Plan: SNF    Current Diagnoses: Patient Active Problem List   Diagnosis Date Noted   Bilateral leg pain 07/26/2022   Abnormal thyroid level free T4 07/22/2022   Generalized pain 07/22/2022   Lymphedema of left arm 07/22/2022   Pelvic mass and ascites 07/20/2022   History of seizures 07/18/2022   HBV (hepatitis B virus) infection 06/24/2022   Seizure ([place]) 04/04/2022   Stage 3b chronic kidney disease (CKD) ([place]) 02/04/2022   Liver cirrhosis, alcoholic ([place]) 40/03/6760   Cirrhosis of liver without ascites ([place])    Alcohol withdrawal seizure with delirium ([place]) 08/31/2021   UTI (urinary tract infection) 08/31/2021   Benign neoplasm of colon    Atypical glandular cells on cervical Pap smear 95/04/3266   Acute metabolic and toxic encephalopathy 08/01/2020   COVID-19 virus infection 08/01/2020   COVID-19 08/01/2020   Chronic diastolic CHF (congestive heart failure) ([place]) 06/26/2020   Thoracic aortic aneurysm without rupture ([place]) 06/26/2020   Hyponatremia 05/31/2020   Pancytopenia ([place]) 05/31/2020   Cellulitis of thigh 04/29/2020   Tinea corporis 04/29/2020   Anemia in chronic kidney disease (CKD) 04/29/2020   Cellulitis of abdominal wall    Constipation    Folate deficiency    Hypomagnesemia    Debility    Gout  03/03/2020   Skin breakdown 03/03/2020   Skin ulcer of perineum, limited to breakdown of skin ([place]) 03/03/2020   Leukocytosis 03/03/2020   Anemia associated with chemotherapy 03/03/2020   Thrombocytopenia ([place]) 03/03/2020   Port-A-Cath in place 02/19/2020   Acute renal failure superimposed on stage 3b chronic kidney disease ([place]) 01/22/2020   S/P left mastectomy 12/19/2019   Colon cancer screening 11/30/2019   Morbid obesity with BMI of 50.0-59.9, adult ([place]) 11/21/2019   Family history of ovarian cancer    Malignant neoplasm of upper-outer quadrant of left breast in female, estrogen receptor positive ([place]) 11/14/2019   Fracture of radial head, right, closed 06/25/2019   Frequent falls 01/10/2019   Bilateral bunions 01/10/2019   Toenail fungus 12/45/8099   Alcoholic hepatitis 83/38/2505   Edema 11/20/2018   Alcohol-induced mood disorder ([place]) 11/01/2013   Alcohol dependence ([place]) 11/01/2013   Essential hypertension 06/20/2007   LOW BACK PAIN 06/20/2007   DIVERTICULOSIS, COLON 05/05/2007    Orientation RESPIRATION BLADDER Height & Weight     Self, Time, Situation, Place  Normal External catheter Weight: (!) 150.4 kg Height:  '5\' 4"'$  (162.6 cm)  BEHAVIORAL SYMPTOMS/MOOD NEUROLOGICAL BOWEL NUTRITION STATUS      Continent Diet (Dysphagia 3 diet)  AMBULATORY STATUS COMMUNICATION OF NEEDS Skin   Extensive Assist Verbally  (Lymphedema R arm)                       Personal Care Assistance Level of Assistance  Bathing, Feeding, Dressing Bathing Assistance: Maximum assistance Feeding  assistance: Limited assistance Dressing Assistance: Maximum assistance     Functional Limitations Info  Sight, Hearing, Speech Sight Info: Impaired Hearing Info: Adequate Speech Info: Adequate    SPECIAL CARE FACTORS FREQUENCY  PT (By licensed PT), OT (By licensed OT)     PT Frequency:  (5x week) OT Frequency:  (5x week)            Contractures Contractures Info: Not present     Additional Factors Info  Code Status, Allergies Code Status Info:  (Full) Allergies Info:  (Amlodipine)           Current Medications (07/27/2022):  This is the current hospital active medication list Current Facility-Administered Medications  Medication Dose Route Frequency Provider Last Rate Last Admin   acetaminophen (TYLENOL) tablet 650 mg  650 mg Oral Q6H PRN Howerter, Justin B, DO   650 mg at 07/26/22 1856   Or   acetaminophen (TYLENOL) suppository 650 mg  650 mg Rectal Q6H PRN Howerter, Justin B, DO       albuterol (PROVENTIL) (2.5 MG/3ML) 0.083% nebulizer solution 2.5 mg  2.5 mg Nebulization Q4H PRN Howerter, Justin B, DO       bisacodyl (DULCOLAX) suppository 10 mg  10 mg Rectal Daily PRN [name], MD       Chlorhexidine Gluconate Cloth 2 % PADS 6 each  6 each Topical Daily Howerter, Justin B, DO   6 each at 07/27/22 0438   dextrose 5 % in lactated ringers infusion   Intravenous Continuous Edwin Dada, MD 50 mL/hr at 07/26/22 1528 Infusion Verify at 07/26/22 1528   divalproex (DEPAKOTE SPRINKLE) capsule 250 mg  250 mg Oral Q12H Danford, Suann Larry, MD   250 mg at 07/27/22 0936   enoxaparin (LOVENOX) injection 30 mg  30 mg Subcutaneous Daily Edwin Dada, MD   30 mg at 07/27/22 3428   HYDROmorphone (DILAUDID) injection 0.5 mg  0.5 mg Intravenous Q4H PRN Mariel Aloe, MD   0.5 mg at 07/26/22 1543   lip balm (CARMEX) ointment   Topical PRN [name], MD       multivitamin with minerals tablet 1 tablet  1 tablet Oral Daily Mariel Aloe, MD   1 tablet at 07/27/22 7681   naloxone (NARCAN) injection 0.4 mg  0.4 mg Intravenous PRN Howerter, Justin B, DO       Oral care mouth rinse  15 mL Mouth Rinse 4 times per day Howerter, Justin B, DO   15 mL at 07/27/22 1572   Oral care mouth rinse  15 mL Mouth Rinse PRN Howerter, Justin B, DO       oxyCODONE (Oxy IR/ROXICODONE) immediate release tablet 5 mg  5 mg Oral Q4H PRN Edwin Dada, MD    5 mg at 07/27/22 0937   polyethylene glycol (MIRALAX / GLYCOLAX) packet 17 g  17 g Oral Daily [name], MD   17 g at 07/26/22 1648   senna-docusate (Senokot-S) tablet 2 tablet  2 tablet Oral BID [name], MD   2 tablet at 07/27/22 0935   thiamine (VITAMIN B1) 500 mg in normal saline (50 mL) IVPB  500 mg Intravenous Q24H Mariel Aloe, MD   Stopped at 07/26/22 1337   Followed by   [name] ON 07/30/2022] thiamine (VITAMIN B1) 250 mg in sodium chloride 0.9 % 50 mL IVPB  250 mg Intravenous Daily Mariel Aloe, MD       Followed by   [  START ON 08/04/2022] thiamine (VITAMIN B1) tablet 100 mg  100 mg Oral Daily Mariel Aloe, MD         Discharge Medications: Please see discharge summary for a list of discharge medications.  Relevant Imaging Results:  Relevant Lab Results:   Additional Information ss#[id number]  Dessa Phi, RN

## 2022-07-27 NOTE — Progress Notes (Signed)
RN received report on patient from off-going RN. RN will resume patient's care until 07/27/22 at 0700. RN agrees with patient's most recent assessment. Will continue to monitor patient for any changes for the rest of the shift.

## 2022-07-27 NOTE — Progress Notes (Signed)
PROGRESS NOTE    [name]  PYP:[id number] DOB: December 11, 1955 DOA: 07/17/2022 PCP: [name], MD   Brief Narrative: [name] is a 66 y.o. female with a history of breast cancer in remission, alcohol dependence, alcohol withdrawal/non-alcohol related seizures, CKD stage IIIb. Patient presented secondary to progressive confusion and found to have AKI likely secondary to ATN. Symptoms slowly improved with IV fluids. During admission, patient was also found to have evidence of an incidental pelvic mass; workup started.   Assessment and Plan: * Acute renal failure superimposed on stage 3b chronic kidney disease (HCC) Baseline creatinine of about 1.5-1.6. Creatinine of 6.23 on admission with peak of 6.30. Per nephrology, possible ATN secondary to hypovolemia. Improvement with IV fluids. Creatinine down to 3.37 after a transient bump. -Repeat BMP in AM -Continue low rate IV fluids  Acute metabolic and toxic encephalopathy Uncertain etiology but possibly multifactorial secondary to hypercarbia, renal failure and gabapentin/Keppra. Patient also with ascites and abdominal pain. Symptoms improved with improvement of renal function and cessation of Keppra.  Pelvic mass and ascites MRI pelvis significant for a right 17 cm adnexal mass. Gynecology/oncology consulted and biopsy performed on 11/21 but was non-diagnostic. US paracentesis ordered but not completed secondary to inadequate fluid. Interventional radiology consulted for attempted CT biopsy of peritoneal site which was performed on 11/27 -Follow-up biopsy (11/27) pathology results  Stage 3b chronic kidney disease (CKD) ([place]) Baseline creatinine of 1.5-1.6.  Bilateral leg pain Unclear etiology. Generalized swelling. Tender. LE venous duplex negative for DVT.  Lymphedema of left arm Chronic. History of breast cancer and worsened secondary to IV fluids.  Generalized pain Chronic issue, although with abdominal pain, mental  status change and ascites. Paracentesis ordered but unable to complete secondary to inadequate fluid. Mental status improved. Abdominal pain possibly related to presumed cancer.  Abnormal thyroid level free T4 Elevated TSH (7.279) with associated elevated free T4 (1.48) in patient not on levothyroxine therapy. Consistent with possible secondary disease and TSH-mediated hyperthyroidism. T3 is low MRI without mention of pituitary abnormality. -Repeat labs as an outpatient if able to discharge soon -Endocrinology follow-up  History of seizures Secondary to alcohol withdrawal. Patient was managed on Keppra which is tapered off secondary to mental status change/lethargy. Depakote started in lieu of Keppra. -Continue Depakote -Outpatient neurology follow-up  Chronic diastolic CHF (congestive heart failure) (HCC) Lasix held. Patient with peripheral edema, however no evidence of cardiogenic etiology. Weight appears to be increasing steadily. Overall, patient is net positive 12.6 L. -Daily weights and strict in/out -May need to consider diuresis but concerning secondary to recent AKI  Anemia in chronic kidney disease (CKD) Stable.  Morbid obesity with BMI of 50.0-59.9, adult ([place]) Estimated body mass index is 56.69 kg/m as calculated from the following:   Height as of this encounter: '5\' 4"'$  (1.626 m).   Weight as of this encounter: 149.8 kg.  Essential hypertension Patient is on amlodipine and Coreg as an outpatient which are held. Blood pressure mostly controlled.  Hypoglycemia-resolved as of 07/25/2022 On admission. Resolved.  Acute cystitis-resolved as of 07/25/2022 Urine culture significant for Klebsiella pneumoniae. Completed antibiotic therapy.    DVT prophylaxis: Lovenox Code Status:   Code Status: Full Code Family Communication: None at bedside. Disposition Plan: Discharge pending continued workup/management   Consultants:  Nephrology Gynecology oncology Interventional  radiology  Procedures:    Antimicrobials: Ceftriaxone Cefazolin    Subjective: Patient reports abdominal pain. Per NT, patient had some pain with swallowing, but patient states she does not have  an issue in her chest but mostly in her lower abdomen.  Objective: BP (!) 157/79 (BP Location: Right Arm)   Pulse (!) 103   Temp 98.1 F (36.7 C) (Oral)   Resp 16   Ht '5\' 4"'$  (1.626 m)   Wt (!) 150.4 kg   SpO2 100%   BMI 56.91 kg/m   Examination:  General exam: Appears calm and comfortable. Chronically ill appearing. Respiratory system: Clear to auscultation. Respiratory effort normal. Cardiovascular system: S1 & S2 heard, RRR. Gastrointestinal system: Abdomen is nondistended, soft and nontender. Normal bowel sounds heard. Central nervous system: Alert and oriented to person and place. Musculoskeletal:  No calf tenderness Skin: No cyanosis. No rashes.    Data Reviewed: I have personally reviewed following labs and imaging studies  CBC Lab Results  Component Value Date   WBC 12.0 (H) 07/27/2022   RBC 3.07 (L) 07/27/2022   HGB 8.9 (L) 07/27/2022   HCT 28.8 (L) 07/27/2022   MCV 93.8 07/27/2022   MCH 29.0 07/27/2022   PLT 357 07/27/2022   MCHC 30.9 07/27/2022   RDW 15.9 (H) 07/27/2022   LYMPHSABS 1.1 07/21/2022   MONOABS 0.8 07/21/2022   EOSABS 0.1 07/21/2022   BASOSABS 0.0 50/56/9794     Last metabolic panel Lab Results  Component Value Date   NA 139 07/27/2022   K 4.8 07/27/2022   CL 109 07/27/2022   CO2 19 (L) 07/27/2022   BUN 42 (H) 07/27/2022   CREATININE 3.37 (H) 07/27/2022   GLUCOSE 91 07/27/2022   GFRNONAA 14 (L) 07/27/2022   GFRAA 40 (L) 06/26/2020   CALCIUM 8.6 (L) 07/27/2022   PHOS 4.3 07/23/2022   PROT 6.6 07/27/2022   ALBUMIN 2.4 (L) 07/27/2022   LABGLOB 4.2 (H) 07/18/2022   AGRATIO 0.7 07/18/2022   BILITOT 0.4 07/27/2022   ALKPHOS 46 07/27/2022   AST 16 07/27/2022   ALT <5 07/27/2022   ANIONGAP 11 07/27/2022    GFR: Estimated  Creatinine Clearance: 24.1 mL/min (A) (by C-G formula based on SCr of 3.37 mg/dL (H)).  Recent Results (from the past 240 hour(s))  Resp Panel by RT-PCR (Flu A&B, Covid) Anterior Nasal Swab     Status: None   Collection Time: 07/17/22 11:30 PM   Specimen: Anterior Nasal Swab  Result Value Ref Range Status   SARS Coronavirus 2 by RT PCR NEGATIVE NEGATIVE Final    Comment: (NOTE) SARS-CoV-2 target nucleic acids are NOT DETECTED.  The SARS-CoV-2 RNA is generally detectable in upper respiratory specimens during the acute phase of infection. The lowest concentration of SARS-CoV-2 viral copies this assay can detect is 138 copies/mL. A negative result does not preclude SARS-Cov-2 infection and should not be used as the sole basis for treatment or other patient management decisions. A negative result may occur with  improper specimen collection/handling, submission of specimen other than nasopharyngeal swab, presence of viral mutation(s) within the areas targeted by this assay, and inadequate number of viral copies(<138 copies/mL). A negative result must be combined with clinical observations, patient history, and epidemiological information. The expected result is Negative.  Fact Sheet for Patients:  EntrepreneurPulse.com.au  Fact Sheet for Healthcare Providers:  IncredibleEmployment.be  This test is no t yet approved or cleared by the [place] FDA and  has been authorized for detection and/or diagnosis of SARS-CoV-2 by FDA under an Emergency Use Authorization (EUA). This EUA will remain  in effect (meaning this test can be used) for the duration of the COVID-19 declaration under Section  564(b)(1) of the Act, 21 U.S.C.section 360bbb-3(b)(1), unless the authorization is terminated  or revoked sooner.       Influenza A by PCR NEGATIVE NEGATIVE Final   Influenza B by PCR NEGATIVE NEGATIVE Final    Comment: (NOTE) The Xpert Xpress  SARS-CoV-2/FLU/RSV plus assay is intended as an aid in the diagnosis of influenza from Nasopharyngeal swab specimens and should not be used as a sole basis for treatment. Nasal washings and aspirates are unacceptable for Xpert Xpress SARS-CoV-2/FLU/RSV testing.  Fact Sheet for Patients: EntrepreneurPulse.com.au  Fact Sheet for Healthcare Providers: IncredibleEmployment.be  This test is not yet approved or cleared by the [place] FDA and has been authorized for detection and/or diagnosis of SARS-CoV-2 by FDA under an Emergency Use Authorization (EUA). This EUA will remain in effect (meaning this test can be used) for the duration of the COVID-19 declaration under Section 564(b)(1) of the Act, 21 U.S.C. section 360bbb-3(b)(1), unless the authorization is terminated or revoked.  Performed at [facility], [place] [address] [place], [place] [address]   Urine Culture     Status: Abnormal   Collection Time: 07/18/22 12:31 AM   Specimen: Urine, Clean Catch  Result Value Ref Range Status   Specimen Description   Final    URINE, CLEAN CATCH Performed at [facility], [place] [address] [place], [place] [address]    Special Requests   Final    NONE Performed at [facility], [place] [address], [place], [place] [address]    Culture >=100,000 COLONIES/mL KLEBSIELLA PNEUMONIAE (A)  Final   Report Status 07/20/2022 FINAL  Final   Organism ID, Bacteria KLEBSIELLA PNEUMONIAE (A)  Final      Susceptibility   Klebsiella pneumoniae - MIC*    AMPICILLIN >=32 RESISTANT Resistant     CEFAZOLIN <=4 SENSITIVE Sensitive     CEFEPIME <=0.12 SENSITIVE Sensitive     CEFTRIAXONE <=0.25 SENSITIVE Sensitive     CIPROFLOXACIN <=0.25 SENSITIVE Sensitive     GENTAMICIN <=1 SENSITIVE Sensitive     IMIPENEM <=0.25 SENSITIVE Sensitive     NITROFURANTOIN <=16 SENSITIVE Sensitive     TRIMETH/SULFA <=20 SENSITIVE  Sensitive     AMPICILLIN/SULBACTAM 4 SENSITIVE Sensitive     PIP/TAZO <=4 SENSITIVE Sensitive     * >=100,000 COLONIES/mL KLEBSIELLA PNEUMONIAE  Culture, blood (routine x 2)     Status: None   Collection Time: 07/18/22  1:45 AM   Specimen: BLOOD  Result Value Ref Range Status   Specimen Description   Final    BLOOD BLOOD RIGHT HAND Performed at [facility] [address] [place], [place] [address]    Special Requests   Final    BOTTLES DRAWN AEROBIC AND ANAEROBIC Blood Culture results may not be optimal due to an excessive volume of blood received in culture bottles Performed at [facility] [address] [place], [place] [address]    Culture   Final    NO GROWTH 5 DAYS Performed at [facility], [place] [address] [place], [place] [address]    Report Status 07/23/2022 FINAL  Final  Culture, blood (routine x 2)     Status: None   Collection Time: 07/18/22  1:45 AM   Specimen: BLOOD  Result Value Ref Range Status   Specimen Description   Final    BLOOD RIGHT ANTECUBITAL Performed at [facility] [address], [place], [place] [address]    Special Requests  Final    BOTTLES DRAWN AEROBIC AND ANAEROBIC Blood Culture results may not be optimal due to an excessive volume of blood received in culture bottles Performed at [facility] [address] [place], [place] [address]    Culture   Final    NO GROWTH 5 DAYS Performed at [facility], [place] [address] [place], [place] [address]    Report Status 07/23/2022 FINAL  Final  MRSA Next Gen by PCR, Nasal     Status: None   Collection Time: 07/18/22  5:19 AM   Specimen: Nasal Mucosa; Nasal Swab  Result Value Ref Range Status   MRSA by PCR Next Gen NOT DETECTED NOT DETECTED Final    Comment: (NOTE) The GeneXpert MRSA Assay (FDA approved for NASAL specimens only), is one component of a comprehensive MRSA colonization surveillance program.  It is not intended to diagnose MRSA infection nor to guide or monitor treatment for MRSA infections. Test performance is not FDA approved in patients less than 66 years old. Performed at [facility], [place] [address], [place], [place] [address]       Radiology Studies: VAS [place] LOWER EXTREMITY VENOUS (DVT)  Result Date: 07/26/2022  Lower Venous DVT Study Patient Name:  MARCEL SORTER  Date of Exam:   07/26/2022 Medical Rec #: [id number]      Accession #:    [id number] Date of Birth: 1956/06/09     Patient Gender: F Patient Age:   73 years Exam Location:  [facility] Procedure:      VAS [place] LOWER EXTREMITY VENOUS (DVT) Referring Phys: Jazelle Achey --------------------------------------------------------------------------------  Indications: Pain.  Limitations: VERY difficult and limited exam due to body habitus (morbid obesity BMI 58), poor ultrasound/tissue interface, and inibilty to cooperate due to pain intolerance. Comparison Study: Previous LLEV exams on 02/10/20 and 11/04/18 were negative for                   DVT. Performing Technologist: Rogelia Rohrer RVT, RDMS  Examination Guidelines: A complete evaluation includes B-mode imaging, spectral Doppler, color Doppler, and power Doppler as needed of all accessible portions of each vessel. Bilateral testing is considered an integral part of a complete examination. Limited examinations for reoccurring indications may be performed as noted. The reflux portion of the exam is performed with the patient in reverse Trendelenburg.  +---------+---------------+---------+-----------+----------+-------------------+ RIGHT    CompressibilityPhasicitySpontaneityPropertiesThrombus Aging      +---------+---------------+---------+-----------+----------+-------------------+ CFV      Full           Yes      Yes                                      +---------+---------------+---------+-----------+----------+-------------------+ SFJ      Full                                                              +---------+---------------+---------+-----------+----------+-------------------+ FV Prox  Full           Yes      Yes                                      +---------+---------------+---------+-----------+----------+-------------------+  FV Mid   Full           Yes      Yes                  Not well visualized +---------+---------------+---------+-----------+----------+-------------------+ FV Distal               Yes      Yes                  Not well visualized +---------+---------------+---------+-----------+----------+-------------------+ PFV      Full                                                             +---------+---------------+---------+-----------+----------+-------------------+ POP      Full           Yes      Yes                  Not well visualized +---------+---------------+---------+-----------+----------+-------------------+ PTV                                                   Not visualized      +---------+---------------+---------+-----------+----------+-------------------+ PERO                                                  Not visualized      +---------+---------------+---------+-----------+----------+-------------------+   Right Technical Findings: Not visualized segments include peroneal and posterior tibial veins. FV distal patent by color and doppler only.  +---------+---------------+---------+-----------+----------+-------------------+ LEFT     CompressibilityPhasicitySpontaneityPropertiesThrombus Aging      +---------+---------------+---------+-----------+----------+-------------------+ CFV      Full                                                             +---------+---------------+---------+-----------+----------+-------------------+ SFJ      Full                                                              +---------+---------------+---------+-----------+----------+-------------------+ FV Prox  Full           Yes      Yes                                      +---------+---------------+---------+-----------+----------+-------------------+ FV Mid   Full           Yes      Yes                  Not well visualized +---------+---------------+---------+-----------+----------+-------------------+ FV Distal  Yes      Yes                  Not well visualized +---------+---------------+---------+-----------+----------+-------------------+ PFV      Full                                                             +---------+---------------+---------+-----------+----------+-------------------+ POP      Full           Yes      Yes                  Not well visualized +---------+---------------+---------+-----------+----------+-------------------+ PTV                                                   Not visualized      +---------+---------------+---------+-----------+----------+-------------------+ PERO                                                  Not visualized      +---------+---------------+---------+-----------+----------+-------------------+   Left Technical Findings: Not visualized segments include Peroneal ans posterior tibial veins. FV distal patent by color and doppler only.   Summary: BILATERAL: -No evidence of popliteal cyst, bilaterally. RIGHT: - There is no evidence of deep vein thrombosis in the lower extremity. However, portions of this examination were limited- see technologist comments above.  LEFT: - There is no evidence of deep vein thrombosis in the lower extremity. However, portions of this examination were limited- see technologist comments above.  *See table(s) above for measurements and observations. Electronically signed by Harold Barban MD on 07/26/2022 at 9:03:40 PM.    Final    CT ABDOMINAL MASS BIOPSY  Result Date:  07/26/2022 CLINICAL DATA:  Large pelvic mass extending into the lower abdomen. EXAM: CT GUIDED CORE BIOPSY OF PELVIC MASS ANESTHESIA/SEDATION: Moderate (conscious) sedation was employed during this procedure. A total of Versed 2.0 mg and Fentanyl 100 mcg was administered intravenously by radiology nursing. Moderate Sedation Time: 21 minutes. The patient's level of consciousness and vital signs were monitored continuously by radiology nursing throughout the procedure under my direct supervision. PROCEDURE: The procedure risks, benefits, and alternatives were explained to the patient. Questions regarding the procedure were encouraged and answered. The patient understands and consents to the procedure. A time-out was performed prior to initiating the procedure. CT was performed through the lower abdomen and pelvis in a supine position. The right lower abdominal wall was prepped with chlorhexidine in a sterile fashion, and a sterile drape was applied covering the operative field. A sterile gown and sterile gloves were used for the procedure. Local anesthesia was provided with 1% Lidocaine. Under CT guidance, a 17 gauge trocar needle was advanced into the right side of the pelvis from an anterior right-sided approach. After confirming needle tip position, several coaxial 18 gauge core biopsy samples were obtained and submitted in formalin. Prior to removing the outer trocar needle, a slurry of Gel-Foam pledgets was injected through the needle as it was retracted and removed.  RADIATION DOSE REDUCTION: This exam was performed according to the departmental dose-optimization program which includes automated exposure control, adjustment of the mA and/or kV according to patient size and/or use of iterative reconstruction technique. COMPLICATIONS: None FINDINGS: Large pelvic mass is again noted which is more centered to the right of midline but also extends across the left of midline. Right-sided approach was taken for biopsy  with core biopsy yielding fragments of solid tissue. IMPRESSION: CT-guided core biopsy performed of a large pelvic mass. Electronically Signed   By: Aletta Edouard M.D.   On: 07/26/2022 11:23      LOS: 9 days    Cordelia Poche, MD [facility] 07/27/2022, 2:00 PM   If 7PM-7AM, please contact night-coverage [email or web address]

## 2022-07-28 DIAGNOSIS — N390 Urinary tract infection, site not specified: Secondary | ICD-10-CM | POA: Diagnosis not present

## 2022-07-28 DIAGNOSIS — R410 Disorientation, unspecified: Secondary | ICD-10-CM

## 2022-07-28 DIAGNOSIS — N179 Acute kidney failure, unspecified: Secondary | ICD-10-CM | POA: Diagnosis not present

## 2022-07-28 LAB — CBC
HCT: 31.5 % — ABNORMAL LOW (ref 36.0–46.0)
Hemoglobin: 9.2 g/dL — ABNORMAL LOW (ref 12.0–15.0)
MCH: 28.6 pg (ref 26.0–34.0)
MCHC: 29.2 g/dL — ABNORMAL LOW (ref 30.0–36.0)
MCV: 97.8 fL (ref 80.0–100.0)
Platelets: 341 10*3/uL (ref 150–400)
RBC: 3.22 MIL/uL — ABNORMAL LOW (ref 3.87–5.11)
RDW: 16.2 % — ABNORMAL HIGH (ref 11.5–15.5)
WBC: 11.7 10*3/uL — ABNORMAL HIGH (ref 4.0–10.5)
nRBC: 0 % (ref 0.0–0.2)

## 2022-07-28 LAB — BASIC METABOLIC PANEL
Anion gap: 10 (ref 5–15)
BUN: 46 mg/dL — ABNORMAL HIGH (ref 8–23)
CO2: 18 mmol/L — ABNORMAL LOW (ref 22–32)
Calcium: 8.6 mg/dL — ABNORMAL LOW (ref 8.9–10.3)
Chloride: 109 mmol/L (ref 98–111)
Creatinine, Ser: 3.5 mg/dL — ABNORMAL HIGH (ref 0.44–1.00)
GFR, Estimated: 14 mL/min — ABNORMAL LOW (ref >60)
Glucose, Bld: 93 mg/dL (ref 70–99)
Potassium: 5.2 mmol/L — ABNORMAL HIGH (ref 3.5–5.1)
Sodium: 137 mmol/L (ref 135–145)

## 2022-07-28 LAB — GLUCOSE, CAPILLARY
Glucose-Capillary: 73 mg/dL (ref 70–99)
Glucose-Capillary: 81 mg/dL (ref 70–99)
Glucose-Capillary: 83 mg/dL (ref 70–99)
Glucose-Capillary: 86 mg/dL (ref 70–99)
Glucose-Capillary: 91 mg/dL (ref 70–99)
Glucose-Capillary: 92 mg/dL (ref 70–99)

## 2022-07-28 LAB — VITAMIN B1: Vitamin B1 (Thiamine): 187.5 nmol/L (ref 66.5–200.0)

## 2022-07-28 MED ORDER — HYDROMORPHONE HCL 1 MG/ML IJ SOLN
0.5000 mg | INTRAMUSCULAR | Status: DC | PRN
  Administered 2022-07-28 – 2022-07-31 (×8): 1 mg via INTRAVENOUS
  Administered 2022-07-31: 0.5 mg via INTRAVENOUS
  Administered 2022-08-01 (×3): 1 mg via INTRAVENOUS
  Administered 2022-08-02: 0.5 mg via INTRAVENOUS
  Administered 2022-08-02 – 2022-08-03 (×7): 1 mg via INTRAVENOUS
  Filled 2022-07-28 (×21): qty 1

## 2022-07-28 NOTE — Progress Notes (Signed)
Occupational Therapy Treatment Patient Details Name: Fatmata Legere MRN: [id number] DOB: 11-26-1955 Today's Date: 07/28/2022   History of present illness Mrs. Speiser is a 66 y.o. F with MO, BrCA in remission, alcohol dependence in remission since 6 months ago, prior alcohol withdrawal seizure, recent admission 2 weeks prior (recent discharge on 06/29/2022) to this admission, for seizure not related to EtOH, noted to have some encephalomalacia and AoCKD who presented this hospitalization with slowly progressive confusion, falls and sleepiness with Cr 6.3, acute hypercabia with respiratory acidosis, as well as a pelvic mass . Underwent biopsy 11/21 of adnexal mass, and 11/22 IR  for CT guided implant biopsy.   OT comments  Patient was noted to have continued pain with limited verbalizations during session. Patient able to engage in tasks at times but inconsistent BUE abilities. Patient was able to tolerate chair position for about 5 mins on this date. Patient would continue to benefit from skilled OT services at this time while admitted and after d/c to address noted deficits in order to improve overall safety and independence in ADLs. Patient's discharge plan remains appropriate at this time. OT will continue to follow acutely.     Recommendations for follow up therapy are one component of a multi-disciplinary discharge planning process, led by the attending physician.  Recommendations may be updated based on patient status, additional functional criteria and insurance authorization.    Follow Up Recommendations  Skilled nursing-short term rehab (<3 hours/day)     Assistance Recommended at Discharge Frequent or constant Supervision/Assistance  Patient can return home with the following  Direct supervision/assist for medications management;Assistance with cooking/housework;Direct supervision/assist for financial management;Assist for transportation;Help with stairs or ramp for entrance;Two people to  help with walking and/or transfers;Two people to help with bathing/dressing/bathroom;Assistance with feeding   Equipment Recommendations  Other (comment) (defer to next venue)    Recommendations for Other Services      Precautions / Restrictions Precautions Precautions: Other (comment) Precaution Comments: seizure precautions, and LUE edema Restrictions Weight Bearing Restrictions: No       Mobility Bed Mobility Overal bed mobility: Needs Assistance Bed Mobility: Supine to Sit, Sit to Supine           General bed mobility comments: TD +2 for supine to sit on edge of bed and to return back to bed    Transfers                         Balance                                           ADL either performed or assessed with clinical judgement   ADL Overall ADL's : Needs assistance/impaired     Grooming: Moderate assistance;Bed level;Wash/dry face Grooming Details (indicate cue type and reason): sitting in chair position with patient noted to have inconsistent movement of BUE with tasks. patient able to use LUE and RUE to scratch face when needed but unable to bring washcloth to same spot.                               General ADL Comments: Patient attempted sitting EOB with TD +3 with patient increased moaning. patient returned to bed and placed into chair position with TD for repositioning    Extremity/Trunk Assessment Upper  Extremity Assessment LUE Deficits / Details: noted to have increased edema in R hand and forearm with notes in chart that lymphedema wrap has been removed. patient was positioned with BUE on pillows with wrist above elbow.            Vision       Perception     Praxis      Cognition Arousal/Alertness: Lethargic, Suspect due to medications Behavior During Therapy: Flat affect Overall Cognitive Status: Difficult to assess                                 General Comments: patient  noted to have minimal conversation during session. mostly moaning during session to respond.        Exercises      Shoulder Instructions       General Comments      Pertinent Vitals/ Pain       Pain Assessment Pain Assessment: Faces Faces Pain Scale: Hurts whole lot Pain Location: abdomen Pain Descriptors / Indicators: Discomfort, Grimacing, Guarding, Moaning Pain Intervention(s): Limited activity within patient's tolerance, Monitored during session, Premedicated before session, Repositioned  Home Living                                          Prior Functioning/Environment              Frequency  Min 2X/week        Progress Toward Goals  OT Goals(current goals can now be found in the care plan section)  Progress towards OT goals: OT to reassess next treatment     Plan Discharge plan remains appropriate    Co-evaluation      Reason for Co-Treatment: For patient/therapist safety;Necessary to address cognition/behavior during functional activity PT goals addressed during session: Mobility/safety with mobility OT goals addressed during session: ADL's and self-care      AM-PAC OT "6 Clicks" Daily Activity     Outcome Measure   Help from another person eating meals?: A Lot Help from another person taking care of personal grooming?: A Lot Help from another person toileting, which includes using toliet, bedpan, or urinal?: Total Help from another person bathing (including washing, rinsing, drying)?: Total Help from another person to put on and taking off regular upper body clothing?: Total Help from another person to put on and taking off regular lower body clothing?: Total 6 Click Score: 8    End of Session    OT Visit Diagnosis: Unsteadiness on feet (R26.81);Muscle weakness (generalized) (M62.81);Pain Pain - Right/Left: Right Pain - part of body: Ankle and joints of foot   Activity Tolerance Patient limited by fatigue;Patient  limited by pain;Patient limited by lethargy   Patient Left in bed;with call bell/phone within reach;with bed alarm set   Nurse Communication Other (comment) (need to call company about bed)        Time: 9562-1308 OT Time Calculation (min): 22 min  Charges: OT General Charges $OT Visit: 1 Visit OT Treatments $Therapeutic Activity: 8-22 mins  Rennie Plowman, MS Acute Rehabilitation Department Office# [phone]   Willa Rough 07/28/2022, 12:53 PM

## 2022-07-28 NOTE — TOC Progression Note (Signed)
Transition of Care [facility]) - Progression Note    Patient Details  Name: [name] MRN: [id number] Date of Birth: [birth year]  Transition of Care [facility]) CM/SW Contact  Japleen Tornow, Juliann Pulse, RN Phone Number: 07/28/2022, 1:16 PM  Clinical Narrative:  Provided patient who deferred to son Col[place]-left vm on bed offers-await choice prior auth.     Expected Discharge Plan: [place] Barriers to Discharge: Continued Medical Work up  Expected Discharge Plan and Services Expected Discharge Plan: [place]                                               Social Determinants of Health (SDOH) Interventions    Readmission Risk Interventions    07/19/2022    9:41 AM 09/07/2021   11:26 AM 03/06/2020   10:03 AM  Readmission Risk Prevention Plan  Transportation Screening Complete Complete Complete  PCP or Specialist Appt within 3-5 Days   Not Complete  Not Complete comments   Unsure of dc date at this time  [place] or [place]   Complete  Social Work Consult for [place] Planning/Counseling   Complete  Palliative Care Screening   Not Applicable  Medication Review [employer]) Complete Complete Complete  PCP or Specialist appointment within 3-5 days of discharge Complete Complete   HRI or Home Care Consult Complete Complete   SW Recovery Care/Counseling Consult Complete Complete   Palliative Care Screening Not Applicable Not [place] Not Applicable Complete

## 2022-07-28 NOTE — Progress Notes (Signed)
Notified on call provider that patient has a foley, but has only put out 100 mL this shift. RN bladder scanned patient and patient had 12 mL in bladder. Charge RN was also notified as well. Foley appears to be in place. No orders at this time. Will pass along to dayshift RN.

## 2022-07-28 NOTE — Care Management Important Message (Signed)
Important Message  Patient Details IM Letter given. Name: [name] MRN: [id number] Date of Birth: 12-13-1955   Medicare Important Message Given:  Yes     Kerin Salen 07/28/2022, 12:46 PM

## 2022-07-28 NOTE — Progress Notes (Signed)
Physical Therapy Treatment Patient Details Name: [name] MRN: [id number] DOB: May 25, 1956 Today's Date: 07/28/2022   History of Present Illness [name] is a 66 y.o. F with MO, BrCA in remission, alcohol dependence in remission since 6 months ago, prior alcohol withdrawal seizure, recent admission 2 weeks prior (recent discharge on 06/29/2022) to this admission, for seizure not related to EtOH, noted to have some encephalomalacia and AoCKD who presented this hospitalization with slowly progressive confusion, falls and sleepiness with Cr 6.3, acute hypercabia with respiratory acidosis, as well as a pelvic mass . Underwent biopsy 11/21 of adnexal mass, and 11/22 IR  for CT guided implant biopsy.    PT Comments    Pt presents with decreased cognition and not very alert/awake however RN reports pt received IV pain meds earlier.  Pt c/o abdominal pain throughout session as well as moaning with assisted mobility.  Pt total +2 assist to sit EOB however unable to tolerate so returned to supine and placed bed in chair position.  Pt tolerated chair position for approx 5 minutes.  Attempted to have pt perform exercises however pt not actively engaged.  Pt required repeated cues to take item from PT then to pass item from one hand to another and then to OT (at least 3 minutes).  D/C plan for SNF remains appropriate.   Recommendations for follow up therapy are one component of a multi-disciplinary discharge planning process, led by the attending physician.  Recommendations may be updated based on patient status, additional functional criteria and insurance authorization.  Follow Up Recommendations  Skilled nursing-short term rehab (<3 hours/day) Can patient physically be transported by private vehicle: No   Assistance Recommended at Discharge Frequent or constant Supervision/Assistance  Patient can return home with the following Two people to help with bathing/dressing/bathroom;Assistance with  cooking/housework;Assistance with feeding;Assist for transportation;Two people to help with walking and/or transfers   Equipment Recommendations  None recommended by PT    Recommendations for Other Services       Precautions / Restrictions Precautions Precautions: Other (comment) Precaution Comments: seizure precautions, and LUE edema Restrictions Weight Bearing Restrictions: No     Mobility  Bed Mobility Overal bed mobility: Needs Assistance Bed Mobility: Supine to Sit, Sit to Supine     Supine to sit: +2 for physical assistance, Total assist Sit to supine: Total assist, +2 for physical assistance   General bed mobility comments: pt not assisting, reporting abdominal pain, total +2 assist    Transfers                        Ambulation/Gait                   Stairs             Wheelchair Mobility    Modified Rankin (Stroke Patients Only)       Balance Overall balance assessment: Needs assistance Sitting-balance support: Bilateral upper extremity supported, Feet unsupported Sitting balance-Leahy Scale: Zero Sitting balance - Comments: pt unable to perform trunk control sitting EOB requiring assist                                    Cognition Arousal/Alertness: Lethargic, Suspect due to medications Behavior During Therapy: Flat affect Overall Cognitive Status: Difficult to assess  General Comments: patient noted to have minimal conversation during session. mostly moaning during session to respond.        Exercises      General Comments        Pertinent Vitals/Pain Pain Assessment Pain Assessment: Faces Faces Pain Scale: Hurts whole lot Pain Location: abdomen Pain Descriptors / Indicators: Discomfort, Grimacing, Guarding, Moaning Pain Intervention(s): Premedicated before session, Monitored during session, Repositioned    Home Living                           Prior Function            PT Goals (current goals can now be found in the care plan section) Progress towards PT goals: Not progressing toward goals - comment    Frequency    Min 2X/week      PT Plan Current plan remains appropriate    Co-evaluation   Reason for Co-Treatment: For patient/therapist safety;Necessary to address cognition/behavior during functional activity PT goals addressed during session: Mobility/safety with mobility OT goals addressed during session: ADL's and self-care      AM-PAC PT "6 Clicks" Mobility   Outcome Measure  Help needed turning from your back to your side while in a flat bed without using bedrails?: Total Help needed moving from lying on your back to sitting on the side of a flat bed without using bedrails?: Total Help needed moving to and from a bed to a chair (including a wheelchair)?: Total Help needed standing up from a chair using your arms (e.g., wheelchair or bedside chair)?: Total Help needed to walk in hospital room?: Total Help needed climbing 3-5 steps with a railing? : Total 6 Click Score: 6    End of Session   Activity Tolerance: Treatment limited secondary to medical complications (Comment) (lethargic, decreased participation) Patient left: in bed;with call bell/phone within reach Nurse Communication: Mobility status PT Visit Diagnosis: Muscle weakness (generalized) (M62.81);Other abnormalities of gait and mobility (R26.89)     Time: 8937-3428 PT Time Calculation (min) (ACUTE ONLY): 24 min  Charges:  $Therapeutic Activity: 8-22 mins                     Jannette Spanner PT, DPT Physical Therapist Acute Rehabilitation Services Preferred contact method: Secure Chat Weekend Pager Only: [phone] Office: [place] 07/28/2022, 4:01 PM

## 2022-07-28 NOTE — Plan of Care (Signed)
  Problem: Coping: Goal: Level of anxiety will decrease Outcome: Progressing   Problem: Pain Managment: Goal: General experience of comfort will improve Outcome: Progressing   Problem: Safety: Goal: Ability to remain free from injury will improve Outcome: Progressing   Problem: Skin Integrity: Goal: Risk for impaired skin integrity will decrease Outcome: Progressing   

## 2022-07-28 NOTE — Progress Notes (Signed)
Progress Note   Patient: [name] VZD:[id number] DOB: [birth year] DOA: 07/17/2022     10 DOS: the patient was seen and examined on 07/28/2022   Brief hospital course: [name] is a 66 y.o. female with a history of breast cancer in remission, alcohol dependence, alcohol withdrawal/non-alcohol related seizures, CKD stage IIIb. Patient presented secondary to progressive confusion and found to have AKI likely secondary to ATN. Symptoms slowly improved with IV fluids. During admission, patient was also found to have evidence of an incidental pelvic mass; workup started.  Assessment and Plan: * Acute renal failure superimposed on stage 3b chronic kidney disease (HCC) Baseline creatinine of about 1.5-1.6. Creatinine of 6.23 on admission with peak of 6.30. Per nephrology, possible ATN secondary to hypovolemia. Improvement with IV fluids.  -Cr today 3.5 -Repeat BMP in AM -Continue low rate IV fluids   Acute metabolic and toxic encephalopathy Uncertain etiology but possibly multifactorial secondary to hypercarbia, renal failure and gabapentin/Keppra. Patient also with ascites and abdominal pain. Symptoms improved with improvement of renal function and cessation of Keppra.   Pelvic mass and ascites MRI pelvis significant for a right 17 cm adnexal mass. Gynecology/oncology consulted and biopsy performed on 11/21 but was non-diagnostic. US paracentesis ordered but not completed secondary to inadequate fluid. Interventional radiology consulted for attempted CT biopsy of peritoneal site which was performed on 11/27 -Follow-up biopsy (11/27) results. Path remains pending today   Stage 3b chronic kidney disease (CKD) (HCC) Baseline creatinine of 1.5-1.6. Cr today 3.50 per above   Bilateral leg pain Unclear etiology. Generalized swelling. Tender. LE venous duplex negative for DVT.   Lymphedema of left arm Chronic. History of breast cancer and worsened secondary to IV fluids.   Generalized  pain Chronic issue, although with abdominal pain, mental status change and ascites. Paracentesis ordered but unable to complete secondary to inadequate fluid. Mental status improved. Abdominal pain possibly related to presumed cancer. -Increased analgesia to dilaudid 0.5-'1mg'$  q3h PRN pain   Abnormal thyroid level free T4 Elevated TSH (7.279) with associated elevated free T4 (1.48) in patient not on levothyroxine therapy. Consistent with possible secondary disease and TSH-mediated hyperthyroidism. T3 is low MRI without mention of pituitary abnormality. -Repeat labs as an outpatient if able to discharge soon -Endocrinology follow-up   History of seizures Secondary to alcohol withdrawal. Patient was managed on Keppra which is tapered off secondary to mental status change/lethargy. Depakote started in lieu of Keppra. -Continue Depakote -Outpatient neurology follow-up   Chronic diastolic CHF (congestive heart failure) (HCC) Lasix held. Patient with peripheral edema, however no evidence of cardiogenic etiology.  -Wt down to 150kg -Daily weights and strict in/out -May need to consider diuresis but concerning secondary to recent AKI   Anemia in chronic kidney disease (CKD) Stable.   Morbid obesity with BMI of 50.0-59.9, adult ([place]) -recommend diet/lifestyle modification   Essential hypertension Patient is on amlodipine and Coreg as an outpatient which are held. Blood pressure mostly controlled.   Hypoglycemia-resolved as of 07/25/2022 On admission. Resolved.   Acute cystitis-resolved as of 07/25/2022 Urine culture significant for Klebsiella pneumoniae. Completed antibiotic therapy.       Subjective: Complains of lower abd pain still, only somewhat improved with dilaudid  Physical Exam: Vitals:   07/27/22 2147 07/28/22 0408 07/28/22 0425 07/28/22 1347  BP:  136/68  105/72  Pulse: 100 99  97  Resp: (!) '21 18  18  '$ Temp:  98.5 F (36.9 C)  98 F (36.7 C)  TempSrc:    Oral  SpO2:  98% 100%  100%  Weight:   (!) 150.3 kg   Height:       General exam: Awake, laying in bed, in nad Respiratory system: Normal respiratory effort, no wheezing Cardiovascular system: regular rate, s1, s2 Gastrointestinal system: Soft, nondistended, positive BS Central nervous system: CN2-12 grossly intact, strength intact Extremities: Perfused, no clubbing Skin: Normal skin turgor, no notable skin lesions seen Psychiatry: Mood normal // no visual hallucinations   Data Reviewed:  Labs reviewed: na 137, K 5.2, Cr 3.5, WBC 11.7, Hgb 9.2, Plts 341  Family Communication: Pt in room, family not at bedside  Disposition: Status is: Inpatient Remains inpatient appropriate because: Severity of illness  Planned Discharge Destination: Home with Home Health    Author: Marylu Lund, MD 07/28/2022 3:31 PM  For on call review [email or web address].

## 2022-07-29 DIAGNOSIS — N179 Acute kidney failure, unspecified: Secondary | ICD-10-CM | POA: Diagnosis not present

## 2022-07-29 DIAGNOSIS — N390 Urinary tract infection, site not specified: Secondary | ICD-10-CM | POA: Diagnosis not present

## 2022-07-29 DIAGNOSIS — R410 Disorientation, unspecified: Secondary | ICD-10-CM | POA: Diagnosis not present

## 2022-07-29 LAB — CBC
HCT: 29.5 % — ABNORMAL LOW (ref 36.0–46.0)
Hemoglobin: 9 g/dL — ABNORMAL LOW (ref 12.0–15.0)
MCH: 28.9 pg (ref 26.0–34.0)
MCHC: 30.5 g/dL (ref 30.0–36.0)
MCV: 94.9 fL (ref 80.0–100.0)
Platelets: 419 10*3/uL — ABNORMAL HIGH (ref 150–400)
RBC: 3.11 MIL/uL — ABNORMAL LOW (ref 3.87–5.11)
RDW: 16.2 % — ABNORMAL HIGH (ref 11.5–15.5)
WBC: 10.7 10*3/uL — ABNORMAL HIGH (ref 4.0–10.5)
nRBC: 0 % (ref 0.0–0.2)

## 2022-07-29 LAB — COMPREHENSIVE METABOLIC PANEL
ALT: <5 U/L (ref 0–44)
AST: 20 U/L (ref 15–41)
Albumin: 2.4 g/dL — ABNORMAL LOW (ref 3.5–5.0)
Alkaline Phosphatase: 47 U/L (ref 38–126)
Anion gap: 10 (ref 5–15)
BUN: 47 mg/dL — ABNORMAL HIGH (ref 8–23)
CO2: 20 mmol/L — ABNORMAL LOW (ref 22–32)
Calcium: 8.6 mg/dL — ABNORMAL LOW (ref 8.9–10.3)
Chloride: 106 mmol/L (ref 98–111)
Creatinine, Ser: 3.24 mg/dL — ABNORMAL HIGH (ref 0.44–1.00)
GFR, Estimated: 15 mL/min — ABNORMAL LOW (ref >60)
Glucose, Bld: 116 mg/dL — ABNORMAL HIGH (ref 70–99)
Potassium: 4.9 mmol/L (ref 3.5–5.1)
Sodium: 136 mmol/L (ref 135–145)
Total Bilirubin: 0.4 mg/dL (ref 0.3–1.2)
Total Protein: 7.1 g/dL (ref 6.5–8.1)

## 2022-07-29 LAB — GLUCOSE, CAPILLARY
Glucose-Capillary: 90 mg/dL (ref 70–99)
Glucose-Capillary: 81 mg/dL (ref 70–99)
Glucose-Capillary: 83 mg/dL (ref 70–99)
Glucose-Capillary: 96 mg/dL (ref 70–99)
Glucose-Capillary: 74 mg/dL (ref 70–99)

## 2022-07-29 MED ORDER — OXYCODONE HCL ER 10 MG PO T12A
10.0000 mg | EXTENDED_RELEASE_TABLET | Freq: Two times a day (BID) | ORAL | Status: DC
  Administered 2022-07-29 – 2022-07-30 (×3): 10 mg via ORAL
  Filled 2022-07-29 (×3): qty 1

## 2022-07-29 NOTE — Progress Notes (Signed)
Progress Note   Patient: [name] FXT:[id number] DOB: 1956/03/27 DOA: 07/17/2022     11 DOS: the patient was seen and examined on 07/29/2022   Brief hospital course: [name] is a 66 y.o. female with a history of breast cancer in remission, alcohol dependence, alcohol withdrawal/non-alcohol related seizures, CKD stage IIIb. Patient presented secondary to progressive confusion and found to have AKI likely secondary to ATN. Symptoms slowly improved with IV fluids. During admission, patient was also found to have evidence of an incidental pelvic mass; workup started.  Assessment and Plan: * Acute renal failure superimposed on stage 3b chronic kidney disease (HCC) Baseline creatinine of about 1.5-1.6. Creatinine of 6.23 on admission with peak of 6.30. Per nephrology, possible ATN secondary to hypovolemia. Improvement with IV fluids.  -Cr today 3.24 -cont to follow bmet trends -Continue low rate IV fluids   Acute metabolic and toxic encephalopathy Uncertain etiology but possibly multifactorial secondary to hypercarbia, renal failure and gabapentin/Keppra. Patient also with ascites and abdominal pain. Symptoms improved with improvement of renal function and cessation of Keppra.   Pelvic mass and ascites MRI pelvis significant for a right 17 cm adnexal mass. Gynecology/oncology consulted and biopsy performed on 11/21 but was non-diagnostic. US paracentesis ordered but not completed secondary to inadequate fluid. Interventional radiology consulted for attempted CT biopsy of peritoneal site which was performed on 11/27 -Follow-up biopsy (11/27) results. Path still pending   Stage 3b chronic kidney disease (CKD) (HCC) Baseline creatinine of 1.5-1.6. Cr today 3.24 per above   Bilateral leg pain Unclear etiology. Generalized swelling. Tender. LE venous duplex negative for DVT.   Lymphedema of left arm Chronic. History of breast cancer and worsened secondary to IV fluids.   Generalized  pain Chronic issue, although with abdominal pain, mental status change and ascites. Paracentesis ordered but unable to complete secondary to inadequate fluid. Mental status improved. Abdominal pain possibly related to presumed cancer. -Recently increased analgesia to dilaudid 0.5-'1mg'$  q3h PRN pain however pt still with notable discomfort -Will add q12h oxycontin   Abnormal thyroid level free T4 Elevated TSH (7.279) with associated elevated free T4 (1.48) in patient not on levothyroxine therapy. Consistent with possible secondary disease and TSH-mediated hyperthyroidism. T3 is low MRI without mention of pituitary abnormality. -Repeat labs as an outpatient if able to discharge soon -Endocrinology follow-up   History of seizures Secondary to alcohol withdrawal. Patient was managed on Keppra which is tapered off secondary to mental status change/lethargy. Depakote started in lieu of Keppra. -Continue Depakote -Outpatient neurology follow-up   Chronic diastolic CHF (congestive heart failure) (HCC) Lasix held. Patient with peripheral edema, however no evidence of cardiogenic etiology.  -Wt down to 150.3kg -Daily weights and strict in/out -May need to consider diuresis but concerning secondary to recent AKI   Anemia in chronic kidney disease (CKD) Stable.   Morbid obesity with BMI of 50.0-59.9, adult ([place]) -recommend diet/lifestyle modification   Essential hypertension Patient is on amlodipine and Coreg as an outpatient which are held. Blood pressure mostly controlled.   Hypoglycemia-resolved as of 07/25/2022 On admission. Resolved.   Acute cystitis-resolved as of 07/25/2022 Urine culture significant for Klebsiella pneumoniae. Completed antibiotic therapy.       Subjective: Continues with lower abd pain somewhat improved with dilaudid IV  Physical Exam: Vitals:   07/28/22 1956 07/28/22 2302 07/29/22 0400 07/29/22 1149  BP: (!) 157/94  (!) 141/88 139/89  Pulse: 98 95 98 98  Resp:  '18 16 20 16  '$ Temp: 97.6 F (36.4 C)  98.6 F (37 C) 98 F (36.7 C)  TempSrc: Oral  Oral Oral  SpO2: 94% 94% 98% 100%  Weight:      Height:       General exam: Conversant, in no acute distress Respiratory system: normal chest rise, clear, no audible wheezing Cardiovascular system: regular rhythm, s1-s2 Gastrointestinal system: Nondistended, nontender, pos BS Central nervous system: No seizures, no tremors Extremities: No cyanosis, no joint deformities Skin: No rashes, no pallor Psychiatry: Affect normal // no auditory hallucinations   Data Reviewed:  Labs reviewed: na 136, K 4.9, Cr 3.24, WBC 10.7, Hgb 9.0, Plts 419  Family Communication: Pt in room, family not at bedside  Disposition: Status is: Inpatient Remains inpatient appropriate because: Severity of illness  Planned Discharge Destination: Home with Home Health    Author: Marylu Lund, MD 07/29/2022 2:10 PM  For on call review [email or web address].

## 2022-07-29 NOTE — TOC Progression Note (Signed)
Transition of Care [facility]) - Progression Note    Patient Details  Name: [name] MRN: [id number] Date of Birth: Apr 19, 1956  Transition of Care [facility]) CM/SW Contact  Lexianna Weinrich, Juliann Pulse, RN Phone Number: 07/29/2022, 3:43 PM  Clinical Narrative:  Damaris Schooner to [place](son) he will review & provide bed choice for ST SNF.     Expected Discharge Plan: [place] Barriers to Discharge: Continued Medical Work up  Expected Discharge Plan and Services Expected Discharge Plan: [place]                                               Social Determinants of Health (SDOH) Interventions    Readmission Risk Interventions    07/19/2022    9:41 AM 09/07/2021   11:26 AM 03/06/2020   10:03 AM  Readmission Risk Prevention Plan  Transportation Screening Complete Complete Complete  PCP or Specialist Appt within 3-5 Days   Not Complete  Not Complete comments   Unsure of dc date at this time  [place] or [place]   Complete  Social Work Consult for [place] Planning/Counseling   Complete  Palliative Care Screening   Not Applicable  Medication Review Press photographer) Complete Complete Complete  PCP or Specialist appointment within 3-5 days of discharge Complete Complete   HRI or Home Care Consult Complete Complete   SW Recovery Care/Counseling Consult Complete Complete   Palliative Care Screening Not Applicable Not [place] Not Applicable Complete

## 2022-07-29 NOTE — Plan of Care (Signed)
  Problem: Clinical Measurements: Goal: Diagnostic test results will improve Outcome: Progressing   Problem: Activity: Goal: Risk for activity intolerance will decrease Outcome: Progressing   Problem: Nutrition: Goal: Adequate nutrition will be maintained Outcome: Progressing

## 2022-07-30 DIAGNOSIS — N390 Urinary tract infection, site not specified: Secondary | ICD-10-CM | POA: Diagnosis not present

## 2022-07-30 DIAGNOSIS — R1900 Intra-abdominal and pelvic swelling, mass and lump, unspecified site: Secondary | ICD-10-CM | POA: Diagnosis not present

## 2022-07-30 DIAGNOSIS — R410 Disorientation, unspecified: Secondary | ICD-10-CM | POA: Diagnosis not present

## 2022-07-30 LAB — COMPREHENSIVE METABOLIC PANEL
ALT: <5 U/L (ref 0–44)
AST: 19 U/L (ref 15–41)
Albumin: 2.4 g/dL — ABNORMAL LOW (ref 3.5–5.0)
Alkaline Phosphatase: 45 U/L (ref 38–126)
Anion gap: 9 (ref 5–15)
BUN: 47 mg/dL — ABNORMAL HIGH (ref 8–23)
CO2: 19 mmol/L — ABNORMAL LOW (ref 22–32)
Calcium: 8.6 mg/dL — ABNORMAL LOW (ref 8.9–10.3)
Chloride: 108 mmol/L (ref 98–111)
Creatinine, Ser: 3.63 mg/dL — ABNORMAL HIGH (ref 0.44–1.00)
GFR, Estimated: 13 mL/min — ABNORMAL LOW (ref >60)
Glucose, Bld: 93 mg/dL (ref 70–99)
Potassium: 5.4 mmol/L — ABNORMAL HIGH (ref 3.5–5.1)
Sodium: 136 mmol/L (ref 135–145)
Total Bilirubin: 0.4 mg/dL (ref 0.3–1.2)
Total Protein: 7.2 g/dL (ref 6.5–8.1)

## 2022-07-30 LAB — GLUCOSE, CAPILLARY
Glucose-Capillary: 104 mg/dL — ABNORMAL HIGH (ref 70–99)
Glucose-Capillary: 77 mg/dL (ref 70–99)
Glucose-Capillary: 90 mg/dL (ref 70–99)
Glucose-Capillary: 96 mg/dL (ref 70–99)
Glucose-Capillary: 98 mg/dL (ref 70–99)

## 2022-07-30 LAB — CBC
HCT: 30.1 % — ABNORMAL LOW (ref 36.0–46.0)
Hemoglobin: 9.1 g/dL — ABNORMAL LOW (ref 12.0–15.0)
MCH: 29.4 pg (ref 26.0–34.0)
MCHC: 30.2 g/dL (ref 30.0–36.0)
MCV: 97.4 fL (ref 80.0–100.0)
Platelets: 457 10*3/uL — ABNORMAL HIGH (ref 150–400)
RBC: 3.09 MIL/uL — ABNORMAL LOW (ref 3.87–5.11)
RDW: 16.4 % — ABNORMAL HIGH (ref 11.5–15.5)
WBC: 11.3 10*3/uL — ABNORMAL HIGH (ref 4.0–10.5)
nRBC: 0 % (ref 0.0–0.2)

## 2022-07-30 MED ORDER — ALBUMIN HUMAN 25 % IV SOLN
25.0000 g | Freq: Once | INTRAVENOUS | Status: AC
  Administered 2022-07-30: 25 g via INTRAVENOUS
  Filled 2022-07-30: qty 100

## 2022-07-30 MED ORDER — SODIUM ZIRCONIUM CYCLOSILICATE 5 G PO PACK
5.0000 g | PACK | Freq: Two times a day (BID) | ORAL | Status: AC
  Administered 2022-07-30 (×2): 5 g via ORAL
  Filled 2022-07-30 (×2): qty 1

## 2022-07-30 MED ORDER — FUROSEMIDE 10 MG/ML IJ SOLN
60.0000 mg | Freq: Once | INTRAMUSCULAR | Status: AC
  Administered 2022-07-30: 60 mg via INTRAVENOUS
  Filled 2022-07-30: qty 6

## 2022-07-30 NOTE — Progress Notes (Signed)
Physical Therapy Treatment Patient Details Name: [name] MRN: [id number] DOB: Apr 29, 1956 Today's Date: 07/30/2022   History of Present Illness [name] is a 66 y.o. F with MO, BrCA in remission, alcohol dependence in remission since 6 months ago, prior alcohol withdrawal seizure, recent admission 2 weeks prior (recent discharge on 06/29/2022) to this admission, for seizure not related to EtOH, noted to have some encephalomalacia and AoCKD who presented this hospitalization with slowly progressive confusion, falls and sleepiness with Cr 6.3, acute hypercabia with respiratory acidosis, as well as a pelvic mass . Underwent biopsy 11/21 of adnexal mass, and 11/22 IR  for CT guided implant biopsy.    PT Comments    Pt only barely whispered half of her name, had no other verbalizations, and with blank stares.  Pt not following any commands.  SPO2 97% on room air and HR 112 bpm obtained via dynamap.  Pt in chair position in bed on arrival however UEs not elevated so elevated UEs especially Lt UE with pillows.  Discussed pt's baseline (appears to be ambulatory short distances with walker and granddaughter assists with ADLs from recent previous admission - end of October) and current status with MD.   Recommendations for follow up therapy are one component of a multi-disciplinary discharge planning process, led by the attending physician.  Recommendations may be updated based on patient status, additional functional criteria and insurance authorization.  Follow Up Recommendations  Skilled nursing-short term rehab (<3 hours/day) Can patient physically be transported by private vehicle: No   Assistance Recommended at Discharge Frequent or constant Supervision/Assistance  Patient can return home with the following Two people to help with bathing/dressing/bathroom;Assistance with cooking/housework;Assistance with feeding;Assist for transportation;Two people to help with walking and/or transfers    Equipment Recommendations  None recommended by PT    Recommendations for Other Services       Precautions / Restrictions Precautions Precautions: Other (comment) Precaution Comments: seizure precautions, and LUE edema     Mobility  Bed Mobility                    Transfers                        Ambulation/Gait                   Stairs             Wheelchair Mobility    Modified Rankin (Stroke Patients Only)       Balance                                            Cognition Arousal/Alertness: Lethargic, Suspect due to medications Behavior During Therapy: Flat affect Overall Cognitive Status: No family/caregiver present to determine baseline cognitive functioning                                 General Comments: pt only barely whispered half of her name, no other verbalizations, blank stares        Exercises      General Comments        Pertinent Vitals/Pain Pain Assessment Pain Assessment: Faces Faces Pain Scale: Hurts even more Pain Location: not stated but has been having abdominal pain Pain Descriptors / Indicators: Grimacing  Home Living                          Prior Function            PT Goals (current goals can now be found in the care plan section) Acute Rehab PT Goals PT Goal Formulation: Patient unable to participate in goal setting Time For Goal Achievement: 08/13/22 Potential to Achieve Goals: Fair Progress towards PT goals: Not progressing toward goals - comment    Frequency    Min 2X/week      PT Plan Current plan remains appropriate    Co-evaluation              AM-PAC PT "6 Clicks" Mobility   Outcome Measure  Help needed turning from your back to your side while in a flat bed without using bedrails?: Total Help needed moving from lying on your back to sitting on the side of a flat bed without using bedrails?: Total Help needed  moving to and from a bed to a chair (including a wheelchair)?: Total Help needed standing up from a chair using your arms (e.g., wheelchair or bedside chair)?: Total Help needed to walk in hospital room?: Total Help needed climbing 3-5 steps with a railing? : Total 6 Click Score: 6    End of Session   Activity Tolerance: Treatment limited secondary to medical complications (Comment);Other (comment) (decreased cognition, decreased participation) Patient left: in bed;with call bell/phone within reach Nurse Communication: Mobility status PT Visit Diagnosis: Muscle weakness (generalized) (M62.81);Other abnormalities of gait and mobility (R26.89)     Time: 1213-1223 PT Time Calculation (min) (ACUTE ONLY): 10 min  Charges:  $Therapeutic Activity: 8-22 mins                    Jannette Spanner PT, DPT Physical Therapist Acute Rehabilitation Services Preferred contact method: Secure Chat Weekend Pager Only: [phone] Office: [place] 07/30/2022, 1:37 PM

## 2022-07-30 NOTE — TOC Progression Note (Signed)
Transition of Care [facility]) - Progression Note    Patient Details  Name: [name] MRN: [id number] Date of Birth: May 12, 1956  Transition of Care [facility]) CM/SW Contact  Shaelynn Dragos, Juliann Pulse, RN Phone Number: 07/30/2022, 3:02 PM  Clinical Narrative:  awaiting choice for ST SNF. Not medically stable.     Expected Discharge Plan: [place] Barriers to Discharge: Continued Medical Work up  Expected Discharge Plan and Services Expected Discharge Plan: [place]                                               Social Determinants of Health (SDOH) Interventions    Readmission Risk Interventions    07/19/2022    9:41 AM 09/07/2021   11:26 AM 03/06/2020   10:03 AM  Readmission Risk Prevention Plan  Transportation Screening Complete Complete Complete  PCP or Specialist Appt within 3-5 Days   Not Complete  Not Complete comments   Unsure of dc date at this time  [place] or [place]   Complete  Social Work Consult for [place] Planning/Counseling   Complete  Palliative Care Screening   Not Applicable  Medication Review Press photographer) Complete Complete Complete  PCP or Specialist appointment within 3-5 days of discharge Complete Complete   HRI or Home Care Consult Complete Complete   SW Recovery Care/Counseling Consult Complete Complete   Palliative Care Screening Not Applicable Not [place] Not Applicable Complete

## 2022-07-30 NOTE — Progress Notes (Signed)
Progress Note   Patient: [name] SKA:[id number] DOB: 1955/09/04 DOA: 07/17/2022     12 DOS: the patient was seen and examined on 07/30/2022   Brief hospital course: Sabeen Piechocki is a 66 y.o. female with a history of breast cancer in remission, alcohol dependence, alcohol withdrawal/non-alcohol related seizures, CKD stage IIIb. Patient presented secondary to progressive confusion and found to have AKI likely secondary to ATN. Symptoms slowly improved with IV fluids. During admission, patient was also found to have evidence of an incidental pelvic mass; workup started.  Assessment and Plan: * Acute renal failure superimposed on stage 3b chronic kidney disease (HCC) Baseline creatinine of about 1.5-1.6. Creatinine of 6.23 on admission with peak of 6.30. Per nephrology, possible ATN secondary to hypovolemia. Improvement with IV fluids.  -Cr today up to 3.63 -pt does appear volume overloaded on exam with generalized edema -only just over 120cc urine output noted this AM -hold further IVF    Acute metabolic and toxic encephalopathy Uncertain etiology but possibly multifactorial secondary to hypercarbia, renal failure and gabapentin/Keppra. Patient also with ascites and abdominal pain. Symptoms improved with improvement of renal function and cessation of Keppra. -Today seems more lethargic. Suspect may be related to below analgesia. Will hold further oxycontin   Pelvic mass and ascites MRI pelvis significant for a right 17 cm adnexal mass. Gynecology/oncology consulted and biopsy performed on 11/21 but was non-diagnostic. US paracentesis ordered but not completed secondary to inadequate fluid. Interventional radiology consulted for attempted CT biopsy of peritoneal site which was performed on 11/27 -Follow-up biopsy (11/27) results. Remains pending   Stage 3b chronic kidney disease (CKD) (HCC) Baseline creatinine of 1.5-1.6. Cr today 3.63 per above   Bilateral leg pain Unclear etiology.  Generalized swelling. Tender. LE venous duplex negative for DVT.   Lymphedema of left arm Chronic. History of breast cancer and worsened secondary to IV fluids.   Generalized pain Chronic issue, although with abdominal pain, mental status change and ascites. Paracentesis ordered but unable to complete secondary to inadequate fluid. Mental status improved. Abdominal pain possibly related to presumed cancer. -Recently increased analgesia to dilaudid 0.5-'1mg'$  q3h PRN pain with oxycontin -With above lethargy, will d/c oxycontin and reduce analgesia   Abnormal thyroid level free T4 Elevated TSH (7.279) with associated elevated free T4 (1.48) in patient not on levothyroxine therapy. Consistent with possible secondary disease and TSH-mediated hyperthyroidism. T3 is low MRI without mention of pituitary abnormality. -Repeat labs as an outpatient if able to discharge soon -Endocrinology follow-up   History of seizures Secondary to alcohol withdrawal. Patient was managed on Keppra which is tapered off secondary to mental status change/lethargy. Depakote started in lieu of Keppra. -Continue Depakote -Outpatient neurology follow-up   Chronic diastolic CHF (congestive heart failure) (HCC) Lasix held. Patient with peripheral edema, however no evidence of cardiogenic etiology.  -Wt now 160kg -Daily weights and strict in/out -Per above, now receiving IV lasix -Recheck bmet in AM -Will repeat 2d echo, last checked 12/25/20   Anemia in chronic kidney disease (CKD) Stable.   Morbid obesity with BMI of 50.0-59.9, adult ([place]) -recommend diet/lifestyle modification   Essential hypertension Patient is on amlodipine and Coreg as an outpatient which are held. Blood pressure mostly controlled.   Hypoglycemia-resolved as of 07/25/2022 On admission. Resolved.   Acute cystitis-resolved as of 07/25/2022 Urine culture significant for Klebsiella pneumoniae. Completed antibiotic  therapy.  Hyperkalemia -prescribed lokelma x 2 doses -Recheck bmet in AM       Subjective: difficult to assess given  mentation  Physical Exam: Vitals:   07/30/22 0437 07/30/22 0629 07/30/22 1231 07/30/22 1432  BP:  (!) 136/96 99/72 111/67  Pulse:  (!) 103 (!) 113 (!) 102  Resp:  20 18   Temp:  98.5 F (36.9 C) 97.6 F (36.4 C)   TempSrc:  Oral Oral   SpO2:  100% 99% 97%  Weight: (!) 160.2 kg     Height:       General exam: Awake, laying in bed, in nad Respiratory system: Normal respiratory effort, no wheezing Cardiovascular system: regular rate, s1, s2 Gastrointestinal system: Soft, nondistended, positive BS Central nervous system: CN2-12 grossly intact, strength intact Extremities: Perfused, no clubbing Skin: Normal skin turgor, no notable skin lesions seen Psychiatry: Mood normal // no visual hallucinations   Data Reviewed:  Labs reviewed: Na 136, K 5.4, Cr 3.63, WBC 11.3, Hgb 9.1  Family Communication: Pt in room, family not at bedside  Disposition: Status is: Inpatient Remains inpatient appropriate because: Severity of illness  Planned Discharge Destination: Skilled nursing facility    Author: Marylu Lund, MD 07/30/2022 5:53 PM  For on call review [email or web address].

## 2022-07-30 NOTE — Progress Notes (Signed)
Pt's sister & brother-in-law here to visit. Many questions regarding plan. Niece Harolyn Rutherford here & stated she had MPOA @ one point, but unsure as to what happened with that title. Son supposedly has MPOA, but found nothing on file, including EMR search with charge nurse. Son has not been noted to visit or be present often & visiting family verbalized having no understanding for what his thought processes are regarding pt's current plan of care. RN Did find a DNR form which was signed earlier in 2021. Pt is  currently stated as full code. Too late in shift to escalate to a great deal, but HS RN, Mariane Baumgarten stated she would notify night on call regarding discrepancy in code status, per Night charge nurse's input.  Documenting RN here tomorrow, & will discuss family concerns with attending MD.

## 2022-07-30 NOTE — Progress Notes (Signed)
Administered albumin, prior to Lasix

## 2022-07-31 ENCOUNTER — Inpatient Hospital Stay (HOSPITAL_COMMUNITY): Payer: Medicare Other

## 2022-07-31 DIAGNOSIS — R410 Disorientation, unspecified: Secondary | ICD-10-CM | POA: Diagnosis not present

## 2022-07-31 DIAGNOSIS — N179 Acute kidney failure, unspecified: Secondary | ICD-10-CM | POA: Diagnosis not present

## 2022-07-31 DIAGNOSIS — N390 Urinary tract infection, site not specified: Secondary | ICD-10-CM | POA: Diagnosis not present

## 2022-07-31 DIAGNOSIS — R1900 Intra-abdominal and pelvic swelling, mass and lump, unspecified site: Secondary | ICD-10-CM | POA: Diagnosis not present

## 2022-07-31 LAB — COMPREHENSIVE METABOLIC PANEL
ALT: <5 U/L (ref 0–44)
AST: 19 U/L (ref 15–41)
Albumin: 2.9 g/dL — ABNORMAL LOW (ref 3.5–5.0)
Alkaline Phosphatase: 47 U/L (ref 38–126)
Anion gap: 10 (ref 5–15)
BUN: 54 mg/dL — ABNORMAL HIGH (ref 8–23)
CO2: 21 mmol/L — ABNORMAL LOW (ref 22–32)
Calcium: 8.9 mg/dL (ref 8.9–10.3)
Chloride: 107 mmol/L (ref 98–111)
Creatinine, Ser: 4.35 mg/dL — ABNORMAL HIGH (ref 0.44–1.00)
GFR, Estimated: 11 mL/min — ABNORMAL LOW (ref >60)
Glucose, Bld: 87 mg/dL (ref 70–99)
Potassium: 5.1 mmol/L (ref 3.5–5.1)
Sodium: 138 mmol/L (ref 135–145)
Total Bilirubin: 0.4 mg/dL (ref 0.3–1.2)
Total Protein: 7.2 g/dL (ref 6.5–8.1)

## 2022-07-31 LAB — CBC
HCT: 28.1 % — ABNORMAL LOW (ref 36.0–46.0)
Hemoglobin: 8.5 g/dL — ABNORMAL LOW (ref 12.0–15.0)
MCH: 29 pg (ref 26.0–34.0)
MCHC: 30.2 g/dL (ref 30.0–36.0)
MCV: 95.9 fL (ref 80.0–100.0)
Platelets: 475 10*3/uL — ABNORMAL HIGH (ref 150–400)
RBC: 2.93 MIL/uL — ABNORMAL LOW (ref 3.87–5.11)
RDW: 16.4 % — ABNORMAL HIGH (ref 11.5–15.5)
WBC: 11.8 10*3/uL — ABNORMAL HIGH (ref 4.0–10.5)
nRBC: 0 % (ref 0.0–0.2)

## 2022-07-31 LAB — GLUCOSE, CAPILLARY
Glucose-Capillary: 76 mg/dL (ref 70–99)
Glucose-Capillary: 88 mg/dL (ref 70–99)
Glucose-Capillary: 88 mg/dL (ref 70–99)
Glucose-Capillary: 90 mg/dL (ref 70–99)
Glucose-Capillary: 92 mg/dL (ref 70–99)
Glucose-Capillary: 97 mg/dL (ref 70–99)

## 2022-07-31 MED ORDER — HYDROMORPHONE HCL 1 MG/ML IJ SOLN: 1.0000 mg | Freq: Once | INTRAMUSCULAR | Status: DC

## 2022-07-31 MED ORDER — FUROSEMIDE 10 MG/ML IJ SOLN
80.0000 mg | Freq: Three times a day (TID) | INTRAMUSCULAR | Status: DC
  Administered 2022-07-31 – 2022-08-03 (×8): 80 mg via INTRAVENOUS
  Filled 2022-07-31 (×9): qty 8

## 2022-07-31 NOTE — Progress Notes (Signed)
Attempting to collect UA & sodium specimens. Have had foley clamped for approx 3 hours, and only obtained approx 10 ml. At this point. HS RN aware, & will continue to attempt further output for ordered labs.

## 2022-07-31 NOTE — Progress Notes (Signed)
Progress Note   Patient: [name] GLO:[id number] DOB: Apr 06, 1956 DOA: 07/17/2022     13 DOS: the patient was seen and examined on 07/31/2022   Brief hospital course: [name] is a 66 y.o. female with a history of breast cancer in remission, alcohol dependence, alcohol withdrawal/non-alcohol related seizures, CKD stage IIIb. Patient presented secondary to progressive confusion and found to have AKI likely secondary to ATN. Symptoms slowly improved with IV fluids. During admission, patient was also found to have evidence of an incidental pelvic mass; workup started.  Assessment and Plan: * Acute renal failure superimposed on stage 3b chronic kidney disease (HCC) Baseline creatinine of about 1.5-1.6. Creatinine of 6.23 on admission with peak of 6.30. Per nephrology, possible ATN secondary to hypovolemia. Improvement with IV fluids.  -Cr today up to 4.35 with minimal urine output despite trial of albumin with IV lasix -appears vol overloaded on exam -Have re-consulted nephrology for assistance. Reviewed renal US. Difficult study given body habitus. L kidney stone noted without hydro   Acute metabolic and toxic encephalopathy Uncertain etiology but possibly multifactorial secondary to hypercarbia, renal failure and gabapentin/Keppra. Patient also with ascites and abdominal pain. Symptoms improved with improvement of renal function and cessation of Keppra. -recently noted to be more lethargic. Suspect may be related to below analgesia. Discontinued oxycontin   Pelvic mass and ascites MRI pelvis significant for a right 17 cm adnexal mass. Gynecology/oncology consulted and biopsy performed on 11/21 but was non-diagnostic. US paracentesis ordered but not completed secondary to inadequate fluid. Interventional radiology consulted for attempted CT biopsy of peritoneal site which was performed on 11/27 -Follow-up biopsy (11/27) results. Remains pending   Stage 3b chronic kidney disease (CKD)  (HCC) Baseline creatinine of 1.5-1.6. Cr today 4.35 per above -F/u with Nephrology recs   Bilateral leg pain Unclear etiology. Generalized swelling. Tender. LE venous duplex negative for DVT.   Lymphedema of left arm Chronic. History of breast cancer and worsened secondary to IV fluids.   Generalized pain Chronic issue, although with abdominal pain, mental status change and ascites. Paracentesis ordered but unable to complete secondary to inadequate fluid. Mental status improved. Abdominal pain possibly related to presumed cancer. -Recently increased analgesia to dilaudid 0.5-'1mg'$  q3h PRN pain    Abnormal thyroid level free T4 Elevated TSH (7.279) with associated elevated free T4 (1.48) in patient not on levothyroxine therapy. Consistent with possible secondary disease and TSH-mediated hyperthyroidism. T3 is low MRI without mention of pituitary abnormality. -Repeat labs as an outpatient if able to discharge soon -Endocrinology follow-up   History of seizures Secondary to alcohol withdrawal. Patient was managed on Keppra which is tapered off secondary to mental status change/lethargy. Depakote started in lieu of Keppra. -Continue Depakote -Outpatient neurology follow-up   Chronic diastolic CHF (congestive heart failure) (HCC) Lasix held. Patient with peripheral edema, however no evidence of cardiogenic etiology.  -Wt currently 157 -Daily weights and strict in/out -Given trial of IV lasix with albumin -Recheck bmet in AM -Will repeat 2d echo, last checked 12/25/20   Anemia in chronic kidney disease (CKD) Stable.   Morbid obesity with BMI of 50.0-59.9, adult ([place]) -recommend diet/lifestyle modification   Essential hypertension Patient is on amlodipine and Coreg as an outpatient which are held. Blood pressure mostly controlled.   Hypoglycemia-resolved as of 07/25/2022 On admission. Resolved.   Acute cystitis-resolved as of 07/25/2022 Urine culture significant for Klebsiella  pneumoniae. Completed antibiotic therapy.  Hyperkalemia -resolved with lokelma       Subjective: Difficult to assess.  Pt seen during renal US. Pt in visible pain  Physical Exam: Vitals:   07/30/22 2042 07/31/22 0500 07/31/22 0503 07/31/22 1400  BP:   131/83 115/77  Pulse: 100  96 (!) 101  Resp: '18  20 18  '$ Temp:   98.3 F (36.8 C) 98.8 F (37.1 C)  TempSrc:   Oral Oral  SpO2: 95%  94% 100%  Weight:  (!) 157.7 kg    Height:       General exam: Conversant, appears in pain Respiratory system: normal chest rise, clear, no audible wheezing Cardiovascular system: regular rhythm, s1-s2 Gastrointestinal system: Nondistended, nontender, pos BS Central nervous system: No seizures, no tremors Extremities: No cyanosis, no joint deformities Skin: No rashes, no pallor Psychiatry: difficult to assess    Data Reviewed:  Labs reviewed: Na 138, K 5.1, Cr 4.36, WBC 11.8, Hgb 8.5  Family Communication: Pt in room, family not at bedside  Disposition: Status is: Inpatient Remains inpatient appropriate because: Severity of illness  Planned Discharge Destination: Skilled nursing facility    Author: Marylu Lund, MD 07/31/2022 5:17 PM  For on call review [email or web address].

## 2022-07-31 NOTE — Progress Notes (Signed)
Admit: 07/17/2022 LOS: 13  46F admit 11/18 with AKI, AMS, CKD3.    Subjective: pt seen earlier during this hospital stay. Creat 6.0 on admission and improved down to 3.5 and renal service signed off. Now creat back up to 3.6 on 12/1 and 4.3 today. Asked to see again.     Physical Exam:  Blood pressure 115/77, pulse (!) 101, temperature 98.8 F (37.1 C), temperature source Oral, resp. rate 18, height _0  (1.626 m), weight (!) 157.7 kg, SpO2 100 %. Gen alert, obese, deconditioned, pleasant No jvd or bruits Chest clear bilat to bases, no rales/ wheezing sp L mastectomy, LUE edema 2+ chronic RRR no RG Abd obesity, ntnd no mass or ascites +bs Ext diffuse 2+ pitting edema of legs / dependent areas/ abd wall Neuro is alert, Ox 3 , nf, no asterixis   Home meds include - albuterol, amlodipine 10, coreg 12.5 bid, lasix 20 qam, gabapentin, keppra, MVI, ultram, trazodone, prns/ vits/ supps      Date              Creat               eGFR    2021             4.29 >> 1.31    AKI episode    11/11/20         1.95         Jan 2023      2.90 >> 1.17    17- 52 ml/min    June 2023    1.79 >> 1.52    31- 38 ml/min    Aug 2023      1.80 >> 1.65    34- 36 ml/min      Oct 2023      1.82 >> 1.53    30- 37 ml/min    11/18                        6.23    07/18/22       6.02                             In 22 L and out 5.6 L = + 16.9 L   Admit wt 134 kg ---> today 157kg = + 23 kg   Renal US 12/02 - . Right kidney was 10.0 cm and left kidney was 11.5 cm. No hydronephrosis is identified   Last UA 11/19 - prot 30, no rbcs, >50 wbc, many bact    UNa 27, UCr 354 on 11/19    BP's 110- 140/ 70- 90 the last 5 days    HR 100, RR 18, afebrile , RA 100%    Labs 138 K 5.1  CO2 21  BUN 54  creat 4.35      Assessment/ Plan AKI on CKD3b - b/l creatinine 1.5- 1.8 from October 2023, eGFR 30- 37 ml/min. Creat here 6.2 on admit 11/19 in setting of poor po intake at home w/ falls. BP's soft. UA +wbc, no prot/ rbcs. CT abd  shows normal kidneys w/o obstruction. Suspected AKI due to vol depletion/ hypotension/ hypoperfusion. Gave IVF"s for > 7 days and creat improved down to 3.2.  We signed off. Now creat up to 3.5 yesterday and 4.3 today. Wt's are ^ 20kg and I/O up 17L since admission. Pt is edematous on exam but no resp issues.  Hopefully this is just vol overload causing a/c diast CHF and causing bump in creatinine. Will start IV lasix 80 tid, f/u labs in am. Fluid restriction.  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  AHRF with hypercapniea: intermittent bipap, stable currently UTI - Cx was + for klebsiella, treated w/ abx per pmd.  H/o L breast cancer - chronic LUE lymphedema on LUE H/o seizures HTN, BPs stable no meds  Kelly Splinter, MD 07/31/2022, 7:19 PM  Recent Labs  Lab 07/30/22 0500 07/31/22 0713  HGB 9.1* 8.5*  ALBUMIN 2.4* 2.9*  CALCIUM 8.6* 8.9  CREATININE 3.63* 4.35*  K 5.4* 5.1    Inpatient medications:  Chlorhexidine Gluconate Cloth  6 each Topical Daily   divalproex  250 mg Oral Q12H   enoxaparin (LOVENOX) injection  30 mg Subcutaneous Daily   furosemide  80 mg Intravenous Q8H   multivitamin with minerals  1 tablet Oral Daily   mouth rinse  15 mL Mouth Rinse 4 times per day   polyethylene glycol  17 g Oral Daily   senna-docusate  2 tablet Oral BID   [START ON 08/04/2022] thiamine  100 mg Oral Daily    thiamine (VITAMIN B1) injection 100 mL/hr at 07/31/22 0947   acetaminophen **OR** acetaminophen, albuterol, bisacodyl, HYDROmorphone (DILAUDID) injection, lip balm, naLOXone (NARCAN)  injection, mouth rinse, oxyCODONE

## 2022-08-01 ENCOUNTER — Inpatient Hospital Stay (HOSPITAL_COMMUNITY): Payer: Medicare Other

## 2022-08-01 DIAGNOSIS — I5031 Acute diastolic (congestive) heart failure: Secondary | ICD-10-CM | POA: Diagnosis not present

## 2022-08-01 DIAGNOSIS — R410 Disorientation, unspecified: Secondary | ICD-10-CM | POA: Diagnosis not present

## 2022-08-01 DIAGNOSIS — N179 Acute kidney failure, unspecified: Secondary | ICD-10-CM | POA: Diagnosis not present

## 2022-08-01 LAB — COMPREHENSIVE METABOLIC PANEL
ALT: 6 U/L (ref 0–44)
AST: 22 U/L (ref 15–41)
Albumin: 2.7 g/dL — ABNORMAL LOW (ref 3.5–5.0)
Alkaline Phosphatase: 51 U/L (ref 38–126)
Anion gap: 11 (ref 5–15)
BUN: 53 mg/dL — ABNORMAL HIGH (ref 8–23)
CO2: 19 mmol/L — ABNORMAL LOW (ref 22–32)
Calcium: 8.8 mg/dL — ABNORMAL LOW (ref 8.9–10.3)
Chloride: 108 mmol/L (ref 98–111)
Creatinine, Ser: 4.51 mg/dL — ABNORMAL HIGH (ref 0.44–1.00)
GFR, Estimated: 10 mL/min — ABNORMAL LOW (ref >60)
Glucose, Bld: 80 mg/dL (ref 70–99)
Potassium: 5.7 mmol/L — ABNORMAL HIGH (ref 3.5–5.1)
Sodium: 138 mmol/L (ref 135–145)
Total Bilirubin: 0.3 mg/dL (ref 0.3–1.2)
Total Protein: 7.5 g/dL (ref 6.5–8.1)

## 2022-08-01 LAB — URINALYSIS, MICROSCOPIC (REFLEX)
Bacteria, UA: NONE SEEN
RBC / HPF: >50 RBC/hpf (ref 0–5)
Squamous Epithelial / HPF: NONE SEEN (ref 0–5)

## 2022-08-01 LAB — URINALYSIS, ROUTINE W REFLEX MICROSCOPIC
Bilirubin Urine: NEGATIVE
Glucose, UA: NEGATIVE mg/dL
Ketones, ur: NEGATIVE mg/dL
Nitrite: NEGATIVE
Specific Gravity, Urine: 1.02 (ref 1.005–1.030)
pH: 5.5 (ref 5.0–8.0)

## 2022-08-01 LAB — GLUCOSE, CAPILLARY
Glucose-Capillary: 71 mg/dL (ref 70–99)
Glucose-Capillary: 74 mg/dL (ref 70–99)
Glucose-Capillary: 76 mg/dL (ref 70–99)
Glucose-Capillary: 76 mg/dL (ref 70–99)
Glucose-Capillary: 82 mg/dL (ref 70–99)
Glucose-Capillary: 91 mg/dL (ref 70–99)

## 2022-08-01 LAB — ECHOCARDIOGRAM COMPLETE
Area-P 1/2: 2.31 cm2
Calc EF: 84.1 %
Height: 64 in
S' Lateral: 2 cm
Single Plane A2C EF: 83.8 %
Single Plane A4C EF: 85.2 %
Weight: 5562.65 oz

## 2022-08-01 LAB — CREATININE, URINE, RANDOM: Creatinine, Urine: 104 mg/dL

## 2022-08-01 LAB — SODIUM, URINE, RANDOM: Sodium, Ur: 56 mmol/L

## 2022-08-01 MED ORDER — SODIUM CHLORIDE 0.9% FLUSH: 10.0000 mL | INTRAVENOUS | Status: DC | PRN

## 2022-08-01 MED ORDER — PERFLUTREN LIPID MICROSPHERE
1.0000 mL | INTRAVENOUS | Status: AC | PRN
  Administered 2022-08-01: 3 mL via INTRAVENOUS

## 2022-08-01 MED ORDER — SODIUM ZIRCONIUM CYCLOSILICATE 5 G PO PACK
5.0000 g | PACK | Freq: Two times a day (BID) | ORAL | Status: AC
  Administered 2022-08-01 (×2): 5 g via ORAL
  Filled 2022-08-01 (×2): qty 1

## 2022-08-01 NOTE — TOC Progression Note (Signed)
Transition of Care [facility]) - Progression Note    Patient Details  Name: [name] MRN: [id number] Date of Birth: 05-26-1956  Transition of Care [facility]) CM/SW Contact  Magalene Mclear, Juliann Pulse, RN Phone Number: 08/01/2022, 2:27 PM  Clinical Narrative: Await choice for ST SNf-left vm w/Colins(Son).      Expected Discharge Plan: [place] Barriers to Discharge: Continued Medical Work up  Expected Discharge Plan and Services Expected Discharge Plan: [place]                                               Social Determinants of Health (SDOH) Interventions    Readmission Risk Interventions    07/19/2022    9:41 AM 09/07/2021   11:26 AM 03/06/2020   10:03 AM  Readmission Risk Prevention Plan  Transportation Screening Complete Complete Complete  PCP or Specialist Appt within 3-5 Days   Not Complete  Not Complete comments   Unsure of dc date at this time  [place] or [place]   Complete  Social Work Consult for [place] Planning/Counseling   Complete  Palliative Care Screening   Not Applicable  Medication Review Press photographer) Complete Complete Complete  PCP or Specialist appointment within 3-5 days of discharge Complete Complete   HRI or Home Care Consult Complete Complete   SW Recovery Care/Counseling Consult Complete Complete   Palliative Care Screening Not Applicable Not [place] Not Applicable Complete

## 2022-08-01 NOTE — Progress Notes (Signed)
Admit: 07/17/2022 LOS: 14  66F admit 11/18 with AKI, AMS, CKD3.    Subjective: pt seen this evening. Per RN they have been having a hard time getting urine into the foley bag. Apparently has been voiding a lot but around the foley cath and into the bed and has been soaking the bed sheets/ pads. Has had to be changed 2-3 times. They have tried replacing the foley cath and repositioning. Pt w/o new c/o's.    Physical Exam:  Blood pressure 129/83, pulse (!) 107, temperature 98.1 F (36.7 C), temperature source Oral, resp. rate 18, height _0  (1.626 m), weight (!) 157.7 kg, SpO2 100 %. Gen alert, obese, deconditioned, pleasant No jvd or bruits Chest clear bilat to bases, no rales/ wheezing sp L mastectomy, LUE edema 2+ chronic RRR no RG Abd obesity, ntnd no mass or ascites +bs Ext diffuse dense pitting edema of legs / dependent areas/ abd wall Neuro is alert, Ox 3 , nf, no asterixis   Home meds include - albuterol, amlodipine 10, coreg 12.5 bid, lasix 20 qam, gabapentin, keppra, MVI, ultram, trazodone, prns/ vits/ supps      Date              Creat               eGFR    2021             4.29 >> 1.31    AKI episode    11/11/20         1.95         Jan 2023      2.90 >> 1.17    17- 52 ml/min    June 2023    1.79 >> 1.52    31- 38 ml/min    Aug 2023      1.80 >> 1.65    34- 36 ml/min      Oct 2023      1.82 >> 1.53    30- 37 ml/min    11/18            6.23    07/18/22       6.02                             In 22 L and out 5.6 L = + 16.9 L   Admit wt 134 kg ---> today 157kg = + 23 kg   Renal US 12/02 - . Right kidney was 10.0 cm and left kidney was 11.5 cm. No hydronephrosis is identified   Last UA 11/19 - prot 30, no rbcs, >50 wbc, many bact    UNa 27, UCr 354 on 11/19    BP's 110- 140/ 70- 90 the last 5 days   Assessment/ Plan AKI on CKD3b - b/l creatinine 1.5- 1.8 from October 2023, eGFR 30- 37 ml/min. Creat here 6.2 on admit 11/19 in setting of poor po intake at home w/ falls.  BP's soft. UA +wbc, no prot/ rbcs. CT abd showed normal kidneys w/o obstruction. Suspected AKI due to vol depletion/ hypotension/ hypoperfusion. We gave IVF"s for > 7 days and creat improved down to 3.2.  We signed off. Now creat climbed again to 4.3 and we were reconsulted 12/02. Wt's were ^ 20kg from admit I/O up 17L since admission. On exam pt was significantly edematous on exam. AKI may be due to decomp diast CHF/ vol overload. We started IV lasix  15m tid. Pt has responded well to this but having foley cath problems so unable to quantitate UOP. Cont IV lasix 80 tid and fluid restriction. RN will d/w pmd about catheter issue.  Will follow.  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  AHRF with hypercapniea: intermittent bipap, stable currently UTI - Cx was + for klebsiella, sp course of abx H/o L breast cancer - chronic LUE lymphedema on LUE H/o seizures HTN - BPs stable no meds  RKelly Splinter MD 08/01/2022, 11:38 PM  Recent Labs  Lab 07/30/22 0500 07/31/22 0713 08/01/22 0448  HGB 9.1* 8.5*  --   ALBUMIN 2.4* 2.9* 2.7*  CALCIUM 8.6* 8.9 8.8*  CREATININE 3.63* 4.35* 4.51*  K 5.4* 5.1 5.7*     Inpatient medications:  Chlorhexidine Gluconate Cloth  6 each Topical Daily   divalproex  250 mg Oral Q12H   enoxaparin (LOVENOX) injection  30 mg Subcutaneous Daily   furosemide  80 mg Intravenous Q8H   multivitamin with minerals  1 tablet Oral Daily   mouth rinse  15 mL Mouth Rinse 4 times per day   polyethylene glycol  17 g Oral Daily   senna-docusate  2 tablet Oral BID   [START ON 08/04/2022] thiamine  100 mg Oral Daily    thiamine (VITAMIN B1) injection 250 mg (08/01/22 1742)   acetaminophen **OR** acetaminophen, albuterol, bisacodyl, HYDROmorphone (DILAUDID) injection, lip balm, naLOXone (NARCAN)  injection, mouth rinse, oxyCODONE, sodium chloride flush

## 2022-08-01 NOTE — Progress Notes (Signed)
During bedside report at shift change, nephrologist came into room. Explained that foley catheter had been changed from a 80 F to 21 F, r/t obvious leaking of urine. Informed provider; since foley change with increase in size, pt is still urinating a large amount of urine around meatus, & saturating bed. Bed changes occurred 4 times throughout day shift. Also informed of late afternoon lasix administration due to loss of IV. Nephrology verbalized understanding of situation. Provided peri-care & clean linens right before shift.

## 2022-08-01 NOTE — Progress Notes (Signed)
Call from phlebotomy, state three people attempted sticks for lab. Unable to collect enough blood after multiple sticks, & the little obtained hemolyzed. Stated enough obtained for CMP results only.  Notified Dr. Wyline Copas via secure chat.

## 2022-08-01 NOTE — Progress Notes (Signed)
Progress Note   Patient: [name] KYH:[id number] DOB: [birth year] DOA: 07/17/2022     14 DOS: the patient was seen and examined on 08/01/2022   Brief hospital course: [name] is a 66 y.o. female with a history of breast cancer in remission, alcohol dependence, alcohol withdrawal/non-alcohol related seizures, CKD stage IIIb. Patient presented secondary to progressive confusion and found to have AKI likely secondary to ATN. Symptoms slowly improved with IV fluids. During admission, patient was also found to have evidence of an incidental pelvic mass; workup started.  Assessment and Plan: * Acute renal failure superimposed on stage 3b chronic kidney disease (HCC) Baseline creatinine of about 1.5-1.6. Creatinine of 6.23 on admission with peak of 6.30. Per nephrology, possible ATN secondary to hypovolemia. Improvement with IV fluids.  -Cr today up to 4.51 with minimal urine output still despite '80mg'$  tid lasix per Nephrology -appears vol overloaded on exam -cont to follow I/o -recheck bmet in AM   Acute metabolic and toxic encephalopathy Uncertain etiology but possibly multifactorial secondary to hypercarbia, renal failure and gabapentin/Keppra. Patient also with ascites and abdominal pain. Symptoms improved with improvement of renal function and cessation of Keppra. -recently noted to be more lethargic, suspect secondary to analgesia -mentation now improved after analgesia regimen decreased   Pelvic mass and ascites MRI pelvis significant for a right 17 cm adnexal mass. Gynecology/oncology consulted and biopsy performed on 11/21 but was non-diagnostic. US paracentesis ordered but not completed secondary to inadequate fluid. Interventional radiology consulted for attempted CT biopsy of peritoneal site which was performed on 11/27 -Follow-up biopsy (11/27) results. Remains pending   Stage 3b chronic kidney disease (CKD) (HCC) Baseline creatinine of 1.5-1.6. Cr today 4.51 per  above -F/u with Nephrology recs   Bilateral leg pain Unclear etiology. Generalized swelling. Tender. LE venous duplex negative for DVT.   Lymphedema of left arm Chronic. History of breast cancer and worsened secondary to IV fluids.   Generalized pain Chronic issue, although with abdominal pain, mental status change and ascites. Paracentesis ordered but unable to complete secondary to inadequate fluid. Mental status improved. Abdominal pain possibly related to presumed cancer. -Recently increased analgesia to dilaudid 0.5-'1mg'$  q3h PRN pain    Abnormal thyroid level free T4 Elevated TSH (7.279) with associated elevated free T4 (1.48) in patient not on levothyroxine therapy. Consistent with possible secondary disease and TSH-mediated hyperthyroidism. T3 is low MRI without mention of pituitary abnormality. -Repeat labs as an outpatient if able to discharge soon -Endocrinology follow-up   History of seizures Secondary to alcohol withdrawal. Patient was managed on Keppra which is tapered off secondary to mental status change/lethargy. Depakote started in lieu of Keppra. -Continue Depakote -Outpatient neurology follow-up   Chronic diastolic CHF (congestive heart failure) (HCC) Lasix held. Patient with peripheral edema, however no evidence of cardiogenic etiology.  -Wt currently 157.7 -Daily weights and strict in/out -continue IV lasix per Nephrology   Anemia in chronic kidney disease (CKD) Stable.   Morbid obesity with BMI of 50.0-59.9, adult ([place]) -recommend diet/lifestyle modification   Essential hypertension Patient is on amlodipine and Coreg as an outpatient which are held. Blood pressure mostly controlled.   Hypoglycemia-resolved as of 07/25/2022 On admission. Resolved.   Acute cystitis-resolved as of 07/25/2022 Urine culture significant for Klebsiella pneumoniae. Completed antibiotic therapy.  Hyperkalemia -will give additional dose of lokelma -recheck bmet in AM        Subjective: Complaining of feeling uncomfortable in bed  Physical Exam: Vitals:   07/31/22 1400 07/31/22 2109 08/01/22 0404  08/01/22 1307  BP: 115/77 (!) 140/93 134/80 (!) 141/73  Pulse: (!) 101 99 (!) 101 (!) 106  Resp: '18 18 18 20  '$ Temp: 98.8 F (37.1 C) 98.1 F (36.7 C) 97.7 F (36.5 C) 99 F (37.2 C)  TempSrc: Oral Oral Oral Oral  SpO2: 100% 95% 94% 100%  Weight:      Height:       General exam: Awake, laying in bed, in nad Respiratory system: Normal respiratory effort, no wheezing Cardiovascular system: regular rate, s1, s2 Gastrointestinal system: Soft, nondistended, positive BS Central nervous system: CN2-12 grossly intact, strength intact Extremities: Perfused, no clubbing Skin: Normal skin turgor, no notable skin lesions seen Psychiatry: Mood normal // no visual hallucinations   Data Reviewed:  Labs reviewed: Na 138, K 5.7, Cr 4.51  Family Communication: Pt in room, family not at bedside  Disposition: Status is: Inpatient Remains inpatient appropriate because: Severity of illness  Planned Discharge Destination: Skilled nursing facility    Author: Marylu Lund, MD 08/01/2022 3:34 PM  For on call review [email or web address].

## 2022-08-02 ENCOUNTER — Other Ambulatory Visit: Payer: Self-pay | Admitting: Oncology

## 2022-08-02 DIAGNOSIS — N390 Urinary tract infection, site not specified: Secondary | ICD-10-CM | POA: Diagnosis not present

## 2022-08-02 DIAGNOSIS — R410 Disorientation, unspecified: Secondary | ICD-10-CM | POA: Diagnosis not present

## 2022-08-02 DIAGNOSIS — N179 Acute kidney failure, unspecified: Secondary | ICD-10-CM | POA: Diagnosis not present

## 2022-08-02 LAB — COMPREHENSIVE METABOLIC PANEL
ALT: 5 U/L (ref 0–44)
AST: 21 U/L (ref 15–41)
Albumin: 2.4 g/dL — ABNORMAL LOW (ref 3.5–5.0)
Alkaline Phosphatase: 45 U/L (ref 38–126)
Anion gap: 9 (ref 5–15)
BUN: 53 mg/dL — ABNORMAL HIGH (ref 8–23)
CO2: 20 mmol/L — ABNORMAL LOW (ref 22–32)
Calcium: 8.5 mg/dL — ABNORMAL LOW (ref 8.9–10.3)
Chloride: 108 mmol/L (ref 98–111)
Creatinine, Ser: 4.33 mg/dL — ABNORMAL HIGH (ref 0.44–1.00)
GFR, Estimated: 11 mL/min — ABNORMAL LOW (ref >60)
Glucose, Bld: 83 mg/dL (ref 70–99)
Potassium: 4.6 mmol/L (ref 3.5–5.1)
Sodium: 137 mmol/L (ref 135–145)
Total Bilirubin: 0.4 mg/dL (ref 0.3–1.2)
Total Protein: 6.9 g/dL (ref 6.5–8.1)

## 2022-08-02 LAB — GLUCOSE, CAPILLARY
Glucose-Capillary: 73 mg/dL (ref 70–99)
Glucose-Capillary: 77 mg/dL (ref 70–99)
Glucose-Capillary: 81 mg/dL (ref 70–99)
Glucose-Capillary: 82 mg/dL (ref 70–99)
Glucose-Capillary: 82 mg/dL (ref 70–99)
Glucose-Capillary: 84 mg/dL (ref 70–99)
Glucose-Capillary: 86 mg/dL (ref 70–99)
Glucose-Capillary: 91 mg/dL (ref 70–99)

## 2022-08-02 LAB — CBC
HCT: 28.1 % — ABNORMAL LOW (ref 36.0–46.0)
Hemoglobin: 8.6 g/dL — ABNORMAL LOW (ref 12.0–15.0)
MCH: 28.7 pg (ref 26.0–34.0)
MCHC: 30.6 g/dL (ref 30.0–36.0)
MCV: 93.7 fL (ref 80.0–100.0)
Platelets: 444 10*3/uL — ABNORMAL HIGH (ref 150–400)
RBC: 3 MIL/uL — ABNORMAL LOW (ref 3.87–5.11)
RDW: 16.7 % — ABNORMAL HIGH (ref 11.5–15.5)
WBC: 13.2 10*3/uL — ABNORMAL HIGH (ref 4.0–10.5)
nRBC: 0 % (ref 0.0–0.2)

## 2022-08-02 LAB — SURGICAL PATHOLOGY

## 2022-08-02 NOTE — Progress Notes (Signed)
Physical Therapy Treatment Patient Details Name: [name] MRN: [id number] DOB: 1955-10-30 Today's Date: 08/02/2022   History of Present Illness [name] is a 66 y.o. F with MO, BrCA in remission, alcohol dependence in remission since 6 months ago, prior alcohol withdrawal seizure, recent admission 2 weeks prior (recent discharge on 06/29/2022) to this admission, for seizure not related to EtOH, noted to have some encephalomalacia and AoCKD who presented this hospitalization with slowly progressive confusion, falls and sleepiness with Cr 6.3, acute hypercabia with respiratory acidosis, as well as a pelvic mass . Underwent biopsy 11/21 of adnexal mass, and 11/22 IR  for CT guided implant biopsy.    PT Comments    Pt appears more awake/alert today then previous session however now moaning throughout session due to abdominal pain.  Pt would benefit from tilting (currently on tilt bed and straps in room now).  Will attempt to implement this next session to see how pt tolerates since attempts with sitting EOB are limited by pain.    Recommendations for follow up therapy are one component of a multi-disciplinary discharge planning process, led by the attending physician.  Recommendations may be updated based on patient status, additional functional criteria and insurance authorization.  Follow Up Recommendations  Skilled nursing-short term rehab (<3 hours/day) Can patient physically be transported by private vehicle: No   Assistance Recommended at Discharge Frequent or constant Supervision/Assistance  Patient can return home with the following Two people to help with bathing/dressing/bathroom;Assistance with cooking/housework;Assistance with feeding;Assist for transportation;Two people to help with walking and/or transfers   Equipment Recommendations  None recommended by PT    Recommendations for Other Services       Precautions / Restrictions Precautions Precautions: Other  (comment) Precaution Comments: seizure precautions, and LUE edema     Mobility  Bed Mobility Overal bed mobility: Needs Assistance Bed Mobility: Supine to Sit, Sit to Supine     Supine to sit: +2 for physical assistance, Total assist Sit to supine: Total assist, +2 for physical assistance   General bed mobility comments: pt initiates however unable to complete without assist, reporting abdominal pain, total +2 assist    Transfers                        Ambulation/Gait                   Stairs             Wheelchair Mobility    Modified Rankin (Stroke Patients Only)       Balance Overall balance assessment: Needs assistance Sitting-balance support: Bilateral upper extremity supported, Feet unsupported Sitting balance-Leahy Scale: Poor                                      Cognition Arousal/Alertness: Awake/alert Behavior During Therapy: Flat affect Overall Cognitive Status: No family/caregiver present to determine baseline cognitive functioning                                 General Comments: pt more awake/alert and speaking minimally today, moaning in pain during session        Exercises      General Comments        Pertinent Vitals/Pain Pain Assessment Faces Pain Scale: Hurts whole lot Pain Location: not stated but has  been having abdominal pain Pain Descriptors / Indicators: Grimacing, Moaning Pain Intervention(s): Repositioned, Monitored during session    Home Living                          Prior Function            PT Goals (current goals can now be found in the care plan section) Progress towards PT goals: Progressing toward goals    Frequency    Min 2X/week      PT Plan Current plan remains appropriate    Co-evaluation              AM-PAC PT "6 Clicks" Mobility   Outcome Measure  Help needed turning from your back to your side while in a flat bed without  using bedrails?: Total Help needed moving from lying on your back to sitting on the side of a flat bed without using bedrails?: Total Help needed moving to and from a bed to a chair (including a wheelchair)?: Total Help needed standing up from a chair using your arms (e.g., wheelchair or bedside chair)?: Total Help needed to walk in hospital room?: Total Help needed climbing 3-5 steps with a railing? : Total 6 Click Score: 6    End of Session   Activity Tolerance: Patient limited by pain Patient left: in bed;with call bell/phone within reach;with bed alarm set   PT Visit Diagnosis: Muscle weakness (generalized) (M62.81);Other abnormalities of gait and mobility (R26.89)     Time: 1220-1232 PT Time Calculation (min) (ACUTE ONLY): 12 min  Charges:  $Therapeutic Activity: 8-22 mins                    Kati PT, DPT Physical Therapist Acute Rehabilitation Services Preferred contact method: Secure Chat Weekend Pager Only: [phone] Office: [phone]    Kati L Payson 08/02/2022, 4:14 PM  

## 2022-08-02 NOTE — Progress Notes (Signed)
Gynecologic Oncology Multi-Disciplinary Disposition Conference Note  Date of the Conference: 08/02/2022  Patient Name: [name]  Primary GYN Oncologist: Dr. 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 Patient is not a candidate for treatment due to functional status.   This Multidisciplinary conference took place involving physicians from [place], Medical Oncology, Radiation Oncology, Pathology, Radiology along with the Gynecologic Oncology Nurse Practitioner and Gynecologic Oncology Nurse Navigator.  Comprehensive assessment of the patient's malignancy, staging, need for surgery, chemotherapy, radiation therapy, and need for further testing were reviewed. Supportive measures, both inpatient and following discharge were also discussed. The recommended plan of care is documented. Greater than 35 minutes were spent correlating and coordinating this patient's care.

## 2022-08-02 NOTE — TOC Progression Note (Signed)
Transition of Care [facility]) - Progression Note    Patient Details  Name: Ayzia Day MRN: [id number] Date of Birth: [birth year]  Transition of Care [facility]) CM/SW Contact  Deonte Otting, Juliann Pulse, RN Phone Number: 08/02/2022, 11:35 AM  Clinical Narrative:Await choice on ST SNF facility-left vm again w/Colins(son) await call back.       Expected Discharge Plan: [place] Barriers to Discharge: Continued Medical Work up  Expected Discharge Plan and Services Expected Discharge Plan: [place]                                               Social Determinants of Health (SDOH) Interventions    Readmission Risk Interventions    07/19/2022    9:41 AM 09/07/2021   11:26 AM 03/06/2020   10:03 AM  Readmission Risk Prevention Plan  Transportation Screening Complete Complete Complete  PCP or Specialist Appt within 3-5 Days   Not Complete  Not Complete comments   Unsure of dc date at this time  [place] or [place]   Complete  Social Work Consult for [place] Planning/Counseling   Complete  Palliative Care Screening   Not Applicable  Medication Review Press photographer) Complete Complete Complete  PCP or Specialist appointment within 3-5 days of discharge Complete Complete   HRI or Home Care Consult Complete Complete   SW Recovery Care/Counseling Consult Complete Complete   Palliative Care Screening Not Applicable Not [place] Not Applicable Complete

## 2022-08-02 NOTE — Progress Notes (Signed)
Admit: 07/17/2022 LOS: 15  1F admit 11/18 with AKI, AMS, CKD3.    Subjective: pt unable to provide much history - moaning due to pain which she says is related to foley pain.   UOP recorded 222m yest but inaccurate due to foley dysfunction and incont.  Cr 4.5 > 4.3.  electrolytes normal.  Poorly diff carcinoma prob gyn origin discussed at multi disc conf - not a treatment candidate.   D/w yesterdays RN as well as today's RN - foley has been flushed and is thought to be in correct position.  Bladder scan x 2-3 yesterday read as 0ML but due to pt habitus unclear if it's accurate. After lasix increased yesterday UOP subjectively increased.    Physical Exam:  Blood pressure (!) 116/92, pulse (!) 106, temperature 97.8 F (36.6 C), temperature source Oral, resp. rate 18, height _0  (1.626 m), weight (!) 151.4 kg, SpO2 100 %. Gen alert, obese, deconditioned, uncomfortable No jvd or bruits Chest clear bilat to bases, no rales/ wheezing sp L mastectomy, LUE edema 2+ chronic RRR no RG Abd obesity, ntnd no mass or ascites +bs  Ext diffuse dense pitting edema of legs / dependent areas/ abd wall Neuro is alert, Ox 3 , nf, no asterixis   Home meds include - albuterol, amlodipine 10, coreg 12.5 bid, lasix 20 qam, gabapentin, keppra, MVI, ultram, trazodone, prns/ vits/ supps      Date              Creat               eGFR    2021             4.29 >> 1.31    AKI episode    11/11/20         1.95         Jan 2023      2.90 >> 1.17    17- 52 ml/min    June 2023    1.79 >> 1.52    31- 38 ml/min    Aug 2023      1.80 >> 1.65    34- 36 ml/min      Oct 2023      1.82 >> 1.53    30- 37 ml/min    11/18            6.23    07/18/22       6.02                             In 22 L and out 5.6 L = + 16.9 L   Admit wt 134 kg ---> today 157kg = + 23 kg   Renal [place] - . Right kidney was 10.0 cm and left kidney was 11.5 cm. No hydronephrosis is identified   Last UA 11/19 - prot 30, no rbcs, >50 wbc, many  bact    UNa 27, UCr 354 on 11/19    BP's 110- 140/ 70- 90 the last 5 days   Assessment/ Plan AKI on CKD3b - b/l creatinine 1.5- 1.8 from October 2023, eGFR 30- 37 ml/min. Creat here 6.2 on admit 11/19 in setting of poor po intake at home w/ falls. BP's soft. UA +wbc, no prot/ rbcs. CT abd showed normal kidneys w/o obstruction. Suspected AKI due to vol depletion/ hypotension/ hypoperfusion. We gave IVF"s for > 7 days and creat improved down to 3.2.  We  signed off. Now creat climbed again to 4.3 and we were reconsulted 12/02. Wt's were ^ 20kg from admit I/O up 17L since admission. On exam pt was significantly edematous on exam. AKI may be due to decomp diast CHF/ vol overload. We started IV lasix 27m tid yest. Pt has responded well to this but had foley cath problems so unable to quantitate UOP. Cont IV lasix 80 tid and fluid restriction. Will follow.  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  Multi disc conference note from 12/4 noted -  Not a candidate for therapy given functional status.  AHRF with hypercapniea: intermittent bipap, stable currently UTI - Cx was + for klebsiella, sp course of abx H/o L breast cancer - chronic LUE lymphedema on LUE H/o seizures HTN - BPs stable no meds  Dispo - unclear - if untreatable advanced malignancy would expect hospice would be only realistic option.    Will follow call with concerns.   LJannifer HickMD [facility] Pager [phone]  Recent Labs  Lab 07/31/22 [phone]/03/23 0448 08/02/22 0206  HGB 8.5*  --  8.6*  ALBUMIN 2.9* 2.7* 2.4*  CALCIUM 8.9 8.8* 8.5*  CREATININE 4.35* 4.51* 4.33*  K 5.1 5.7* 4.6     Inpatient medications:  Chlorhexidine Gluconate Cloth  6 each Topical Daily   divalproex  250 mg Oral Q12H   enoxaparin (LOVENOX) injection  30 mg Subcutaneous Daily   furosemide  80 mg Intravenous Q8H   multivitamin with minerals  1  tablet Oral Daily   mouth rinse  15 mL Mouth Rinse 4 times per day   polyethylene glycol  17 g Oral Daily   senna-docusate  2 tablet Oral BID   [START ON 08/04/2022] thiamine  100 mg Oral Daily    thiamine (VITAMIN B1) injection 250 mg (08/02/22 0823)   acetaminophen **OR** acetaminophen, albuterol, bisacodyl, HYDROmorphone (DILAUDID) injection, lip balm, naLOXone (NARCAN)  injection, mouth rinse, oxyCODONE, sodium chloride flush

## 2022-08-02 NOTE — Progress Notes (Signed)
Progress Note   Patient: [name] [id number]:[id number] DOB: Sep 20, 1955 DOA: 07/17/2022     15 DOS: the patient was seen and examined on 08/02/2022   Brief hospital course: [name] is a 66 y.o. female with a history of breast cancer in remission, alcohol dependence, alcohol withdrawal/non-alcohol related seizures, CKD stage IIIb. Patient presented secondary to progressive confusion and found to have AKI likely secondary to ATN. Symptoms slowly improved with IV fluids. During admission, patient was also found to have evidence of an incidental pelvic mass; workup started.  Assessment and Plan: * Acute renal failure superimposed on stage 3b chronic kidney disease (HCC) Baseline creatinine of about 1.5-1.6. Creatinine of 6.23 on admission with peak of 6.30. Per nephrology, possible ATN secondary to hypovolemia. Improvement with IV fluids.  -Cr today 4.33. Concern of leaking foley, thus likely inaccurate I/O's.  -Pt continued on lasix '80mg'$  TID per nephrology. Edema seems to be improved today -recheck bmet in AM   Acute metabolic and toxic encephalopathy Uncertain etiology but possibly multifactorial secondary to hypercarbia, renal failure and gabapentin/Keppra. Patient also with ascites and abdominal pain. Symptoms improved with improvement of renal function and cessation of Keppra. -recently noted to be more lethargic, suspect secondary to analgesia -mentation now improved after analgesia regimen decreased   Pelvic mass and ascites MRI pelvis significant for a right 17 cm adnexal mass. Gynecology/oncology consulted and biopsy performed on 11/21 but was non-diagnostic. US paracentesis ordered but not completed secondary to inadequate fluid. Interventional radiology consulted for attempted CT biopsy of peritoneal site which was performed on 11/27 -IR biopsy (11/27). Results discussed with Gyn-Onc. Concern for poorly differentiated carcinoma noted.  Gyn-Onc plans to discuss with pt and family  tomorrow. Have consulted Palliative Care to help establish further goals of care   Stage 3b chronic kidney disease (CKD) (HCC) Baseline creatinine of 1.5-1.6. Cr today 4.33 per above -F/u with Nephrology recs, currently on '80mg'$  tid lasix   Bilateral leg pain Unclear etiology. Generalized swelling. Tender. LE venous duplex negative for DVT.   Lymphedema of left arm Chronic. History of breast cancer and worsened secondary to IV fluids.   Generalized pain Chronic issue, although with abdominal pain, mental status change and ascites. Paracentesis ordered but unable to complete secondary to inadequate fluid. Mental status improved. Abdominal pain possibly related to presumed cancer. -Recently increased analgesia to dilaudid 0.5-'1mg'$  q3h PRN pain    Abnormal thyroid level free T4 Elevated TSH (7.279) with associated elevated free T4 (1.48) in patient not on levothyroxine therapy. Consistent with possible secondary disease and TSH-mediated hyperthyroidism. T3 is low MRI without mention of pituitary abnormality. -Repeat labs as an outpatient if able to discharge soon -Endocrinology follow-up   History of seizures Secondary to alcohol withdrawal. Patient was managed on Keppra which is tapered off secondary to mental status change/lethargy. Depakote started in lieu of Keppra. -Continue Depakote -Outpatient neurology follow-up   Chronic diastolic CHF (congestive heart failure) (HCC) Lasix held. Patient with peripheral edema, however no evidence of cardiogenic etiology.  -Wt currently 151.4kg -Daily weights and strict in/out -continue IV lasix per Nephrology   Anemia in chronic kidney disease (CKD) Stable.   Morbid obesity with BMI of 50.0-59.9, adult ([place]) -recommend diet/lifestyle modification   Essential hypertension Patient is on amlodipine and Coreg as an outpatient which are held. Blood pressure mostly controlled.   Hypoglycemia-resolved as of 07/25/2022 On admission. Resolved.    Acute cystitis-resolved as of 07/25/2022 Urine culture significant for Klebsiella pneumoniae. Completed antibiotic therapy.  Hyperkalemia -normalized -  recheck bmet in AM       Subjective: Without complaints, however, pt appears visibly uncomfortable in bed  Physical Exam: Vitals:   08/02/22 0815 08/02/22 0824 08/02/22 0825 08/02/22 1311  BP:  (!) 116/92  138/75  Pulse:  (!) 106  (!) 102  Resp:  18  20  Temp:   97.8 F (36.6 C) (!) 97.5 F (36.4 C)  TempSrc:   Oral Oral  SpO2: 99% 100%  100%  Weight:      Height:       General exam: Awake, laying in bed, in nad Respiratory system: Normal respiratory effort, no wheezing Cardiovascular system: regular rate, s1, s2 Gastrointestinal system: Soft, nondistended, positive BS Central nervous system: CN2-12 grossly intact, strength intact Extremities: Perfused, no clubbing Skin: Normal skin turgor, no notable skin lesions seen Psychiatry: Mood normal // no visual hallucinations   Data Reviewed:  Labs reviewed: Na 137, K 4.6, Cr 4.33  Family Communication: Pt in room, family not at bedside  Disposition: Status is: Inpatient Remains inpatient appropriate because: Severity of illness  Planned Discharge Destination: Skilled nursing facility    Author: Marylu Lund, MD 08/02/2022 2:53 PM  For on call review [email or web address].

## 2022-08-02 NOTE — Care Management Important Message (Signed)
Important Message  Patient Details IM Letter placed in Patient's room. Name: [name] MRN: [id number] Date of Birth: Nov 01, 1955   Medicare Important Message Given:  Yes     Kerin Salen 08/02/2022, 11:00 AM

## 2022-08-03 LAB — GLUCOSE, CAPILLARY
Glucose-Capillary: 92 mg/dL (ref 70–99)
Glucose-Capillary: 81 mg/dL (ref 70–99)
Glucose-Capillary: 91 mg/dL (ref 70–99)
Glucose-Capillary: 101 mg/dL — ABNORMAL HIGH (ref 70–99)
Glucose-Capillary: 80 mg/dL (ref 70–99)

## 2022-08-03 LAB — CBC
HCT: 28.1 % — ABNORMAL LOW (ref 36.0–46.0)
Hemoglobin: 8.8 g/dL — ABNORMAL LOW (ref 12.0–15.0)
MCH: 28.9 pg (ref 26.0–34.0)
MCHC: 31.3 g/dL (ref 30.0–36.0)
MCV: 92.1 fL (ref 80.0–100.0)
Platelets: 471 10*3/uL — ABNORMAL HIGH (ref 150–400)
RBC: 3.05 MIL/uL — ABNORMAL LOW (ref 3.87–5.11)
RDW: 16.8 % — ABNORMAL HIGH (ref 11.5–15.5)
WBC: 13.1 10*3/uL — ABNORMAL HIGH (ref 4.0–10.5)
nRBC: 0.2 % (ref 0.0–0.2)

## 2022-08-03 LAB — COMPREHENSIVE METABOLIC PANEL
ALT: <5 U/L (ref 0–44)
AST: 22 U/L (ref 15–41)
Albumin: 2.4 g/dL — ABNORMAL LOW (ref 3.5–5.0)
Alkaline Phosphatase: 52 U/L (ref 38–126)
Anion gap: 13 (ref 5–15)
BUN: 55 mg/dL — ABNORMAL HIGH (ref 8–23)
CO2: 19 mmol/L — ABNORMAL LOW (ref 22–32)
Calcium: 8.6 mg/dL — ABNORMAL LOW (ref 8.9–10.3)
Chloride: 106 mmol/L (ref 98–111)
Creatinine, Ser: 4.17 mg/dL — ABNORMAL HIGH (ref 0.44–1.00)
GFR, Estimated: 11 mL/min — ABNORMAL LOW (ref >60)
Glucose, Bld: 91 mg/dL (ref 70–99)
Potassium: 4.5 mmol/L (ref 3.5–5.1)
Sodium: 138 mmol/L (ref 135–145)
Total Bilirubin: 0.6 mg/dL (ref 0.3–1.2)
Total Protein: 7.3 g/dL (ref 6.5–8.1)

## 2022-08-03 MED ORDER — FUROSEMIDE 10 MG/ML IJ SOLN
80.0000 mg | Freq: Three times a day (TID) | INTRAMUSCULAR | Status: DC
  Administered 2022-08-03 – 2022-08-04 (×4): 80 mg via INTRAVENOUS
  Filled 2022-08-03 (×4): qty 8

## 2022-08-03 MED ORDER — LORAZEPAM 2 MG/ML PO CONC: 1.0000 mg | ORAL | Status: DC | PRN

## 2022-08-03 MED ORDER — GLYCOPYRROLATE 1 MG PO TABS: 1.0000 mg | ORAL_TABLET | ORAL | Status: DC | PRN

## 2022-08-03 MED ORDER — ONDANSETRON HCL 4 MG/2ML IJ SOLN: 4.0000 mg | Freq: Four times a day (QID) | INTRAMUSCULAR | Status: DC | PRN

## 2022-08-03 MED ORDER — DIAZEPAM 5 MG/ML IJ SOLN
5.0000 mg | Freq: Three times a day (TID) | INTRAMUSCULAR | Status: DC
  Administered 2022-08-03 – 2022-08-04 (×4): 5 mg via INTRAVENOUS
  Filled 2022-08-03 (×4): qty 2

## 2022-08-03 MED ORDER — HYDROMORPHONE HCL-NACL 50-0.9 MG/50ML-% IV SOLN
0.5000 mg/h | INTRAVENOUS | Status: DC
  Filled 2022-08-03: qty 50

## 2022-08-03 MED ORDER — SODIUM CHLORIDE 0.9 % IV SOLN
1.0000 mg/h | INTRAVENOUS | Status: DC
  Administered 2022-08-03: 0.5 mg/h via INTRAVENOUS
  Filled 2022-08-03 (×2): qty 5

## 2022-08-03 MED ORDER — ONDANSETRON 4 MG PO TBDP: 4.0000 mg | ORAL_TABLET | Freq: Four times a day (QID) | ORAL | Status: DC | PRN

## 2022-08-03 MED ORDER — FUROSEMIDE 40 MG PO TABS: 80.0000 mg | ORAL_TABLET | Freq: Two times a day (BID) | ORAL | Status: DC

## 2022-08-03 MED ORDER — LORAZEPAM 2 MG/ML IJ SOLN: 1.0000 mg | INTRAMUSCULAR | Status: DC | PRN

## 2022-08-03 MED ORDER — GLYCOPYRROLATE 0.2 MG/ML IJ SOLN: 0.2000 mg | INTRAMUSCULAR | Status: DC | PRN

## 2022-08-03 MED ORDER — ACETAMINOPHEN 325 MG PO TABS: 650.0000 mg | ORAL_TABLET | Freq: Four times a day (QID) | ORAL | Status: DC | PRN

## 2022-08-03 MED ORDER — POLYVINYL ALCOHOL 1.4 % OP SOLN: 1.0000 [drp] | Freq: Four times a day (QID) | OPHTHALMIC | Status: DC | PRN

## 2022-08-03 MED ORDER — HYDROMORPHONE HCL 1 MG/ML IJ SOLN
0.5000 mg | INTRAMUSCULAR | Status: DC | PRN
  Administered 2022-08-03 – 2022-08-04 (×4): 0.5 mg via INTRAVENOUS
  Filled 2022-08-03 (×4): qty 0.5

## 2022-08-03 MED ORDER — BIOTENE DRY MOUTH MT LIQD: 15.0000 mL | OROMUCOSAL | Status: DC | PRN

## 2022-08-03 MED ORDER — ACETAMINOPHEN 650 MG RE SUPP: 650.0000 mg | Freq: Four times a day (QID) | RECTAL | Status: DC | PRN

## 2022-08-03 MED ORDER — LORAZEPAM 1 MG PO TABS: 1.0000 mg | ORAL_TABLET | ORAL | Status: DC | PRN

## 2022-08-03 NOTE — Progress Notes (Signed)
Progress Note   Patient: [name] [id number]:[id number] DOB: November 20, 1955 DOA: 07/17/2022     16 DOS: the patient was seen and examined on 08/03/2022   Brief hospital course: [name] is a 66 y.o. female with a history of breast cancer in remission, alcohol dependence, alcohol withdrawal/non-alcohol related seizures, CKD stage IIIb. Patient presented secondary to progressive confusion and found to have AKI likely secondary to ATN. Symptoms slowly improved with IV fluids. During admission, patient was also found to have evidence of an incidental pelvic mass; workup started.  Assessment and Plan: * Acute renal failure superimposed on stage 3b chronic kidney disease (HCC) Baseline creatinine of about 1.5-1.6. Creatinine of 6.23 on admission with peak of 6.30. Per nephrology, possible ATN secondary to hypovolemia. Initially improved with IV fluids.  -Later concerns of worsening renal function in setting of vol overload. See below -Cr today 4.17.Pt continued on lasix '80mg'$  TID per nephrology. Edema seems to be improving  -recheck bmet in AM -nephrology following   Acute metabolic and toxic encephalopathy Uncertain etiology but possibly multifactorial secondary to hypercarbia, renal failure and gabapentin/Keppra. Patient also with ascites and abdominal pain. Symptoms improved with improvement of renal function and cessation of Keppra. -of note, recently noted to be more lethargic after addition of oxycontin -mentation now improved after analgesia regimen decreased   Pelvic mass and ascites -Large 17cm mass noted on imaging -IR biopsy (11/27). Results discussed with Gyn-Onc. Concern for poorly differentiated carcinoma noted.  -Gyn-Onc plans to discuss with pt and family this afternoon in coordination with Palliative Care. Will follow   Stage 3b chronic kidney disease (CKD) (HCC) Baseline creatinine of 1.5-1.6. Cr today 4.17 per above -F/u with Nephrology recs, currently on '80mg'$  tid lasix    Bilateral leg pain Unclear etiology. Generalized swelling. Tender. LE venous duplex negative for DVT.   Lymphedema of left arm Chronic. History of breast cancer and worsened secondary to IV fluids.   Generalized pain Chronic issue, although with abdominal pain, mental status change and ascites. Paracentesis ordered but unable to complete secondary to inadequate fluid. Mental status improved. Abdominal pain possibly related to presumed cancer. -continued on dilaudid 0.5-'1mg'$  q3h PRN pain    Abnormal thyroid level free T4 Elevated TSH (7.279) with associated elevated free T4 (1.48) in patient not on levothyroxine therapy. Consistent with possible secondary disease and TSH-mediated hyperthyroidism. T3 is low MRI without mention of pituitary abnormality. -Endocrinology follow-up as outpatient   History of seizures Secondary to alcohol withdrawal. Patient was managed on Keppra which is tapered off secondary to mental status change/lethargy. Depakote started in lieu of Keppra. -Continue Depakote -Outpatient neurology follow-up   Chronic diastolic CHF (congestive heart failure) (HCC) -Wt currently 151.4kg -Grossly vol overloaded on exam -Now on '80mg'$  TID lasix per Nephrology -Daily weights and strict in/out -continue IV lasix per Nephrology   Anemia in chronic kidney disease (CKD) Stable.   Morbid obesity with BMI of 50.0-59.9, adult ([place]) -recommend diet/lifestyle modification   Essential hypertension Patient is on amlodipine and Coreg as an outpatient which are held. Blood pressure mostly controlled.   Hypoglycemia-resolved as of 07/25/2022 On admission. Resolved.   Acute cystitis-resolved as of 07/25/2022 Urine culture significant for Klebsiella pneumoniae. Completed antibiotic therapy.  Hyperkalemia -normalized -recheck bmet in AM       Subjective: complains of abd pains  Physical Exam: Vitals:   08/02/22 2120 08/03/22 0538 08/03/22 0539 08/03/22 1311  BP: 113/85   117/88 112/74  Pulse: (!) 105 (!) 107 (!) 108 (!) 109  Resp: '20 18  18  '$ Temp: 97.7 F (36.5 C)   97.7 F (36.5 C)  TempSrc: Oral   Oral  SpO2: 100% 98% 97% 100%  Weight:      Height:       General exam: Conversant, in no acute distress Respiratory system: normal chest rise, clear, no audible wheezing Cardiovascular system: regular rhythm, s1-s2 Gastrointestinal system: Nondistended, nontender, pos BS Central nervous system: No seizures, no tremors Extremities: No cyanosis, no joint deformities Skin: No rashes, no pallor Psychiatry: Affect normal // no auditory hallucinations   Data Reviewed:  Labs reviewed: Na 138, K 4.5, Cr 4.17  Family Communication: Pt in room, family not at bedside  Disposition: Status is: Inpatient Remains inpatient appropriate because: Severity of illness  Planned Discharge Destination: Skilled nursing facility    Author: Marylu Lund, MD 08/03/2022 2:49 PM  For on call review [email or web address].

## 2022-08-03 NOTE — Consult Note (Incomplete)
Consultation Note Date: 08/03/2022   Patient Name: [name]  DOB: 1955/10/23  MRN: [id number]  Age / Sex: 66 y.o., female  PCP: Sue Pardon, MD Referring Physician: Donne Hazel, MD  Reason for Consultation: Goals of Care  HPI/Patient Profile: 66 y.o. female  with past medical history of breast cancer, ETOH use disorder, non-ETOH seizures, and CKD Stage IIIb admitted on 07/17/2022 with confusion secondary to AKI. During hospitalization, a pelvic mass was identified and work-up started. MRI and biopsy on 11/27 confirmed poorly differentiated carcinoma; neuroendocrine suspected. Per Nephrology and Gynecology, patient is not a candidate for chemotherapy given rapid decline in functional status.  Extensive EMR review prior to visit and discussion with bedside nursing staff and Dr. Ernestina [name], [name]. Presented to bedside for visit. Patient observed lethargic and unable to contribute to review of systems.   Primary Decision Maker Designated party release on file for [place] and [name] (son and daughter-in-law) and niece [name]  Discussion: Attempted to reach [name] without success. Bedside RN called at later time and was able to reach [name]; reports that he plans to visit around 3pm today.   SUMMARY OF RECOMMENDATIONS   -Concur with Dr. Ernestina [name] on meeting with GYN, PMT, and family to discuss goals of care. -Discuss code status with family. -PMT to continue to follow for goals of care and symptom management needs.  Code Status/Advance Care Planning: Full Code (scanned DNR from 2021 in ACP system - PMT to confirm with family)   Prognosis:   Unable to determine  Discharge Planning: To Be Determined  Primary Diagnoses: Present on Admission:  Acute renal failure superimposed on stage 3b chronic kidney disease (HCC)  Acute metabolic and toxic encephalopathy  Chronic diastolic CHF  (congestive heart failure) (HCC)  Anemia in chronic kidney disease (CKD)  Essential hypertension  Pelvic mass and ascites  Stage 3b chronic kidney disease (CKD) ([place])    Physical Exam Pulmonary:     Effort: Pulmonary effort is normal.  Abdominal:     General: There is distension.  Skin:    Coloration: Skin is sallow.  Neurological:     Mental Status: She is lethargic.     Vital Signs: BP 112/74 (BP Location: Right Arm)   Pulse (!) 109   Temp 97.7 F (36.5 C) (Oral)   Resp 18   Ht '5\' 4"'$  (1.626 m)   Wt (!) 151.4 kg   SpO2 100%   BMI 57.29 kg/m  Pain Scale: Faces POSS *See Group Information*: S-Acceptable,Sleep, easy to arouse Pain Score: Asleep   SpO2: SpO2: 100 % O2 Device:SpO2: 100 % O2 Flow Rate: .O2 Flow Rate (L/min): 2 L/min  IO: Intake/output summary:  Intake/Output Summary (Last 24 hours) at 08/03/2022 1350 Last data filed at 08/02/2022 1700 Gross per 24 hour  Intake 60 ml  Output --  Net 60 ml    LBM: Last BM Date : 08/02/22 Baseline Weight: Weight: 129.7 kg Most recent weight: Weight: (!) 151.4 kg       Thank you  for this consult. Palliative medicine will continue to follow and assist as needed.   Signed by: Sue Mc, RN MSN [facility] / NP Student Palliative Medicine    Please contact Palliative Medicine Team phone at [phone] for questions and concerns.  For individual provider: See [name]

## 2022-08-03 NOTE — Progress Notes (Addendum)
Gynecologic Oncology Brief Progress Note   Present for goals of care discussion led by Dr. Hilma Favors with patient's son, [name].  Reviewed VIR biopsy results that demonstrated a poorly differentiated carcinoma with staining consistent with malignancy of GYN origin.  Agree with discussion that this represents an advanced cancer and that cancer directed treatment is not recommended for this patient based on her medical and functional status as chemotherapy would be more likely to shorten her life than provide any benefit.  Agree and support decision to proceed with comfort measures and transfer to inpatient hospice.  Given that the patient now has a personal history of breast and gynecologic malignancy as well as a family history of a half-sister of the patient with ovarian cancer, would recommend genetic counseling referral for patient's children.  This was not addressed at this time but will attempt to follow-up on this at a later time.

## 2022-08-03 NOTE — Plan of Care (Signed)
  Problem: Nutrition: Goal: Adequate nutrition will be maintained Outcome: Progressing   Problem: Skin Integrity: Goal: Risk for impaired skin integrity will decrease Outcome: Progressing   

## 2022-08-03 NOTE — Progress Notes (Signed)
Admit: 07/17/2022 LOS: 16  68F admit 11/18 with AKI, AMS, CKD3.    Subjective: pt unable to provide much history  - just moaning in pain which seems to be bladder pain but will not describe. UOP documented 225 yesterday but was inaccurate given foley leaking and pt incontinent.  Wts are not being done regularly (ordered).  Cr improved to 4.1 today.  BP 110s-130s - no anti HTN meds.    Physical Exam:  Blood pressure 117/88, pulse (!) 108, temperature 97.7 F (36.5 C), temperature source Oral, resp. rate 18, height _0  (1.626 m), weight (!) 151.4 kg, SpO2 97 %. Gen alert, obese, deconditioned, uncomfortable No jvd or bruits Chest clear bilat to bases, no rales/ wheezing sp L mastectomy, LUE edema 2+ chronic lymphedema RRR no RG Abd obesity, ntnd no mass or ascites +bs  Ext 1+ except LUE 3+ chronic Neuro is alert, Ox 3 , nf, no asterixis   Home meds include - albuterol, amlodipine 10, coreg 12.5 bid, lasix 20 qam, gabapentin, keppra, MVI, ultram, trazodone, prns/ vits/ supps      Date              Creat               eGFR    2021             4.29 >> 1.31    AKI episode    11/11/20         1.95         Jan 2023      2.90 >> 1.17    17- 52 ml/min    June 2023    1.79 >> 1.52    31- 38 ml/min    Aug 2023      1.80 >> 1.65    34- 36 ml/min      Oct 2023      1.82 >> 1.53    30- 37 ml/min    11/18            6.23    07/18/22       6.02                             In 22 L and out 5.6 L = + 16.9 L   Admit wt 134 kg ---> today 157kg = + 23 kg   Renal US 12/02 - . Right kidney was 10.0 cm and left kidney was 11.5 cm. No hydronephrosis is identified   Last UA 11/19 - prot 30, no rbcs, >50 wbc, many bact    UNa 27, UCr 354 on 11/19    BP's 110- 140/ 70- 90 the last 5 days   Assessment/ Plan AKI on CKD3b - b/l creatinine 1.5- 1.8 from October 2023, eGFR 30- 37 ml/min. Creat here 6.2 on admit 11/19 in setting of poor po intake at home w/ falls. BP's soft. UA +wbc, no prot/ rbcs. CT abd  showed normal kidneys w/o obstruction. Suspected AKI due to vol depletion/ hypotension/ hypoperfusion. We gave IVF"s for > 7 days and creat improved down to 3.2.  We signed off. Now creat climbed again to 4.3 and we were reconsulted 12/02. Noted edema and wt incrased. AKI thought to be due to decomp diast CHF/ vol overload. Started IV lasix 47m tid Sun. Pt has responded well to this but had foley cath problems so unable to quantitate UOP. Cont IV lasix for  now and cont fluid restriction. Will follow.  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  Multi disc conference note from 12/4 noted -  Not a candidate for therapy given functional status.  AHRF with hypercapniea: intermittent bipap, stable currently UTI - Cx was + for klebsiella, sp course of abx H/o L breast cancer - chronic LUE lymphedema on LUE H/o seizures HTN - BPs stable no meds  Dispo - unclear - if untreatable advanced malignancy would expect hospice would be only realistic option.  Certainly wouldn't be a candidate for dialysis in light of functional status and untreatable malignancy.   Will follow call with concerns.   Jannifer Hick MD [facility] Pager [phone]   Recent Labs  Lab 08/02/22 0206 08/03/22 0238  HGB 8.6* 8.8*  ALBUMIN 2.4* 2.4*  CALCIUM 8.5* 8.6*  CREATININE 4.33* 4.17*  K 4.6 4.5     Inpatient medications:  Chlorhexidine Gluconate Cloth  6 each Topical Daily   divalproex  250 mg Oral Q12H   enoxaparin (LOVENOX) injection  30 mg Subcutaneous Daily   furosemide  80 mg Intravenous Q8H   multivitamin with minerals  1 tablet Oral Daily   mouth rinse  15 mL Mouth Rinse 4 times per day   polyethylene glycol  17 g Oral Daily   senna-docusate  2 tablet Oral BID   [START ON 08/04/2022] thiamine  100 mg Oral Daily     acetaminophen **OR** acetaminophen, albuterol, bisacodyl, HYDROmorphone (DILAUDID)  injection, lip balm, naLOXone (NARCAN)  injection, mouth rinse, oxyCODONE, sodium chloride flush

## 2022-08-04 DIAGNOSIS — N390 Urinary tract infection, site not specified: Secondary | ICD-10-CM

## 2022-08-04 LAB — RENAL FUNCTION PANEL
Albumin: 2.5 g/dL — ABNORMAL LOW (ref 3.5–5.0)
Anion gap: 11 (ref 5–15)
BUN: 55 mg/dL — ABNORMAL HIGH (ref 8–23)
CO2: 19 mmol/L — ABNORMAL LOW (ref 22–32)
Calcium: 8.7 mg/dL — ABNORMAL LOW (ref 8.9–10.3)
Chloride: 108 mmol/L (ref 98–111)
Creatinine, Ser: 4.75 mg/dL — ABNORMAL HIGH (ref 0.44–1.00)
GFR, Estimated: 10 mL/min — ABNORMAL LOW (ref >60)
Glucose, Bld: 79 mg/dL (ref 70–99)
Phosphorus: 5.4 mg/dL — ABNORMAL HIGH (ref 2.5–4.6)
Potassium: 5 mmol/L (ref 3.5–5.1)
Sodium: 138 mmol/L (ref 135–145)

## 2022-08-04 LAB — GLUCOSE, CAPILLARY
Glucose-Capillary: 75 mg/dL (ref 70–99)
Glucose-Capillary: 67 mg/dL — ABNORMAL LOW (ref 70–99)
Glucose-Capillary: 78 mg/dL (ref 70–99)

## 2022-08-04 MED ORDER — HEPARIN SOD (PORK) LOCK FLUSH 100 UNIT/ML IV SOLN
500.0000 [IU] | INTRAVENOUS | Status: AC | PRN
  Administered 2022-08-04: 500 [IU]
  Filled 2022-08-04: qty 5

## 2022-08-04 MED ORDER — LORAZEPAM 2 MG/ML PO CONC: 1.0000 mg | ORAL | 0 refills | Status: DC | PRN

## 2022-08-04 MED ORDER — HYDROMORPHONE BOLUS VIA INFUSION
1.0000 mg | INTRAVENOUS | Status: DC | PRN
  Administered 2022-08-04: 1 mg via INTRAVENOUS

## 2022-08-04 MED ORDER — DIVALPROEX SODIUM 125 MG PO CSDR: 250.0000 mg | DELAYED_RELEASE_CAPSULE | Freq: Two times a day (BID) | ORAL | Status: DC

## 2022-08-04 MED ORDER — DIAZEPAM 5 MG/ML IJ SOLN: 5.0000 mg | Freq: Three times a day (TID) | INTRAMUSCULAR | Status: DC

## 2022-08-04 MED ORDER — BISACODYL 10 MG RE SUPP: 10.0000 mg | Freq: Every day | RECTAL | 0 refills | Status: DC | PRN

## 2022-08-04 MED ORDER — ACETAMINOPHEN 325 MG PO TABS: 650.0000 mg | ORAL_TABLET | Freq: Four times a day (QID) | ORAL | Status: DC | PRN

## 2022-08-04 MED ORDER — GLYCOPYRROLATE 1 MG PO TABS: 1.0000 mg | ORAL_TABLET | ORAL | Status: DC | PRN

## 2022-08-04 MED ORDER — SODIUM CHLORIDE 0.9 % IV SOLN: 0.5000 mg/h | INTRAVENOUS | 0 refills | Status: DC

## 2022-08-04 MED ORDER — HYDROMORPHONE BOLUS VIA INFUSION: 0.5000 mg | INTRAVENOUS | Status: DC | PRN

## 2022-08-04 MED ORDER — LORAZEPAM 2 MG/ML IJ SOLN: 1.0000 mg | INTRAMUSCULAR | 0 refills | Status: DC | PRN

## 2022-08-04 MED ORDER — HYDROMORPHONE BOLUS VIA INFUSION: 1.0000 mg | INTRAVENOUS | 0 refills | Status: DC | PRN

## 2022-08-04 MED ORDER — ALBUTEROL SULFATE (2.5 MG/3ML) 0.083% IN NEBU: 2.5000 mg | INHALATION_SOLUTION | RESPIRATORY_TRACT | 12 refills | Status: DC | PRN

## 2022-08-04 MED ORDER — FUROSEMIDE 10 MG/ML IJ SOLN: 80.0000 mg | Freq: Three times a day (TID) | INTRAMUSCULAR | 0 refills | Status: DC

## 2022-08-04 MED ORDER — LORAZEPAM 1 MG PO TABS: 1.0000 mg | ORAL_TABLET | ORAL | 0 refills | Status: DC | PRN

## 2022-08-04 NOTE — Plan of Care (Signed)
  Problem: Coping: Goal: Level of anxiety will decrease Outcome: Progressing   Problem: Pain Managment: Goal: General experience of comfort will improve Outcome: Progressing   Problem: Clinical Measurements: Goal: Quality of life will improve Outcome: Progressing

## 2022-08-04 NOTE — Progress Notes (Signed)
Nephrology notes the change of patient status to comfort care with plans for hospice which I believe is appropriate.  I didn't see her today.  If I can be of further assistance please reach out.   Jannifer Hick MD [facility] Pager [phone]

## 2022-08-04 NOTE — Progress Notes (Signed)
Report called and given to [facility]. Transportation called per LOC and patient is scheduled for pick up @ 8 p.m.

## 2022-08-04 NOTE — Consult Note (Signed)
Palliative Care Consultation Note  66 yo with multiple chronic medical problems and new diagnosis of metastatic GYN cancer, followed by Dr. Ernestina Patches, admitted with severe intractable abdominal pain. Now HOD 17 with minimal improvement or recovery She is not felt to be a candidate for systemic treatment due to her functional status decline. Palliative care consulted for goals of care and symptom management. Per record it has been difficult for [place] to connect with and update family on her condition and determine goals and decision making. On chart review it was also noted that she has a DNR in [place] but was admitted as a full code. Granddaughter was the source of history in ED, but has not been present since that time. Patient has a history of alcohol use disorder, recent hospitalization for new onset seizure, but felt to be from withdrawal, she never stabilized after discharge on 06/29/22 and was readmitted on 11/18.  I met Dr. Ernestina Patches at bedside today and we met with her [name] to update him on his  mothers condition and to determine goals of care. [name] led with the fact that his mother "had a DNR and wanted it that way". He tells [place] he knows she isn't getting better and doesn't want her to suffer. We shared our alignment with that and recommended a comfort care transition and hospice IPU level of care because of the severity of her symptoms of pain and agitation.  Recommendations: DNR, Comfort Care Initiated a hydromorphone infusion at 0.5/hr, RN bolus 43m q4 prn Discontinued all meds not related to her comfort, comfort feeding as tolerated TOC referral for Hospice IPU- this patient has complex care needs at EOL including poorly controlled pain and agitation, seizure management and heart failure on high dose lasix IV earlier in admission and may need additional doses. Prognosis is <2 weeks  ELane Hacker DO Palliative Medicine

## 2022-08-04 NOTE — Progress Notes (Signed)
Physical Therapy Discharge Patient Details Name: Moriah Shawley MRN: [id number] DOB: [birth year] Today's Date: 08/04/2022 Time:  -     Patient discharged from PT services secondary to medical decline - Patient is now on comfort  Care measures.  Please see latest therapy progress note for current level of functioning and progress toward goals.    Progress and discharge plan discussed with patient and/or caregiver: N/A  GP [place] Office [phone] Weekend pager-[phone]      Claretha Cooper 08/04/2022, 12:53 PM

## 2022-08-04 NOTE — Discharge Summary (Signed)
Physician Discharge Summary  Jeena Arnett JKK:[id number] DOB: [birth year] DOA: 07/17/2022  PCP: [name], MD  Admit date: 07/17/2022 Discharge date: 08/04/2022  Time spent: 60 minutes  Recommendations for Outpatient Follow-up:  Patient to be discharged to Residential Hospice home at [facility].   Discharge Diagnoses:  Principal Problem:   Acute renal failure superimposed on stage 3b chronic kidney disease (HCC) Active Problems:   Acute metabolic and toxic encephalopathy   Stage 3b chronic kidney disease (CKD) (HCC)   Pelvic mass and ascites   Essential hypertension   Morbid obesity with BMI of 50.0-59.9, adult (HCC)   Anemia in chronic kidney disease (CKD)   Chronic diastolic CHF (congestive heart failure) (HCC)   History of seizures   Abnormal thyroid level free T4   Generalized pain   Lymphedema of left arm   Bilateral leg pain   Acute UTI   Discharge Condition: Stable  Diet recommendation: Comfort feeds.  Filed Weights   07/30/22 0437 07/31/22 0500 08/02/22 0500  Weight: (!) 160.2 kg (!) 157.7 kg (!) 151.4 kg    History of present illness:  HPI per Dr Velia Meyer Kenady Doxtater is a 66 y.o. female with medical history significant for chronic alcohol abuse, stage IIIb CKD associated baseline creatinine range 1.5 recently diagnosed with seizures left-sided breast cancer diagnosed in April 2021 status postmastectomy with axillary lymph chronic diastolic heart failure, essential hypertension, chronic disease associated baseline hemoglobin 9-11 who is admitted to [facility] on 07/17/2022 with acute kidney injury superimposed on CKD 3B after presenting from home to [facility] ED for evaluation of altered mental status.   In the setting of the patient's altered mental status, the following history is provided over the phone by the patient's granddaughter, in addition to my discussions with the EDP and via chart review.   The patient was recently hospitalized in the  [facility] health system at the end of October 2023, at which time she received new diagnosis of seizures, started on Keppra.  Over the last 2 to 3 days, patient's granddaughter conveys that the patient has become progressively more confused, somnolent and in the setting of experienced a fall without associated loss of consciousness on 1723 following which she has been complaining of some right-sided leg discomfort.  Reportedly no associated loss of consciousness.  In the context of further progression of the patient's confusion, EMS was contacted, and noted the patient's initial O2 sat to be in the low 80s on room air, with subsequent improvement into the high 90s on 2 to 3 L nasal cannula.  This is in the context of no known baseline supplemental oxygen requirements.  Granddaughter also conveys that the patient has exhibited significant reduction in oral intake over the last 2 to 3 days.   Medical history notable for CKD 3B a/w baseline creatinine range of 1.5-1.8, with most recent prior serum creatinine data point noted to be 1.53 on 06/28/2022.            ED Course:  Vital signs in the ED were notable for the following: Afebrile; heart rate 85; systolic blood pressures in the 90s to low 100s; respiratory rate 15-18, oxygen saturation, as noted above, initially 90s on 2 to 3 L nasal cannula.   Labs were notable for the following: VBG notable for 7.21/63.  4.9, bicarbonate 25, anion gap creatinine 6.237.2.  Liver enzymes within normal limits.  Serum ethanol level less than 10.  High-sensitivity troponin I found recent prior value of 194 in January  2023.  Initiation of antibiotics, while COVID-19/Avinza PCR were negative.  Lactic acid 1.2.  CBC notable for cell count 6500 with 64% neutrophils, hemoglobin 10.26 with normocytic/normochromic findings as well as nonelevated RDW, and relative features of prior hemoglobin urinalysis associated with amber appearing cloudy specimen with findings notable for greater  than 50 white blood cells, many bacteria, large leukocyte esterase, no squamous epithelial cells also showing 30 protein, no RBCs, and was positive for hyaline cast.   Per my interpretation, EKG in ED demonstrated the following: EKG shows sinus rhythm with heart rate 83, normal intervals, and no evidence of T wave or ST changes, including no evidence of ST elevation.  Symptoms of the left hip showed no evidence of acute fracture.  Plain films of the right knee showed no evidence of acute fracture.  Vitals of the right ankle show no evidence of acute fracture.  Chest x-ray shows cardiomegaly, in the absence of any evidence of acute cardiopulmonary process complete evidence of infiltrate, edema, effusion, or pneumothorax.  CT head shows no evidence of acute intracranial process, including no evidence of intracranial hemorrhage or any evidence of acute infarct.  CT cervical spine showed no evidence of acute cervical spine fracture or subluxation injury.  CT abdomen/pelvis without contrast, per radiology read, shows a moderate complex pelvic fluid collection with hemorrhage related to trauma felt to be less likely.  Acute intra-abdominal or acute intrapelvic process.     While in the ED, the following were administered: Rocephin, 1 L normal saline bolus.   Subsequently, the patient was admitted acute kidney injury superimposed on CKD 3B, with presentation also notable for acute metabolic encephalopathy, acute cystitis, acute hypoxic hypercapnic respiratory failure, as well as potential new diagnosis of pelvic mass.     Hospital Course:  Brief hospital course: Keera Altidor is a 66 y.o. female with a history of breast cancer in remission, alcohol dependence, alcohol withdrawal/non-alcohol related seizures, CKD stage IIIb. Patient presented secondary to progressive confusion and found to have AKI likely secondary to ATN. Symptoms slowly improved with IV fluids. During admission, patient was also found to have  evidence of an incidental pelvic mass; workup started.   Assessment and Plan: * Acute renal failure superimposed on stage 3b chronic kidney disease (HCC) Baseline creatinine of about 1.5-1.6. Creatinine of 6.23 on admission with peak of 6.30. Per nephrology, possible ATN secondary to hypovolemia. Initially improved with IV fluids.  -Later concerns of worsening renal function in setting of vol overload. See below -Cr 4.75 (08/04/2022).Pt continued on lasix '80mg'$  TID per nephrology. Edema seemed to be improving  ---Due to patient's biopsy results of poorly differentiated carcinoma consistent with malignancy of GYN origin it was noted that patient did have advanced cancer and cancer directed treatment not recommended, patient seen by palliative care and GYN oncology, who discussed with family and decision made to transition to full comfort measures. -Patient will be discharged to residential hospice home. -Patient assessed by palliative care, placed on IV pain medication of Dilaudid initially and subsequently placed on a Dilaudid drip     Acute metabolic and toxic encephalopathy Uncertain etiology but possibly multifactorial secondary to hypercarbia, renal failure and gabapentin/Keppra. Patient also with ascites and abdominal pain. Symptoms improved with improvement of renal function and cessation of Keppra. -of note, recently noted to be more lethargic after addition of oxycontin -mentation improved after analgesia regimen decreased.   Pelvic mass and ascites -Large 17cm mass noted on imaging -IR biopsy (11/27). Results discussed with Gyn-Onc.  Concern for poorly differentiated carcinoma noted.  --Due to patient's biopsy results of poorly differentiated carcinoma consistent with malignancy of GYN origin it was noted that patient did have advanced cancer and cancer directed treatment not recommended, patient seen by palliative care and GYN oncology, who discussed with family and decision made to  transition to full comfort measures. -Patient will be discharged to residential hospice home. -Patient assessed by palliative care, placed on IV pain medication of Dilaudid initially and subsequently placed on a Dilaudid drip   Stage 3b chronic kidney disease (CKD) ([place]) Baseline creatinine of 1.5-1.6. Cr noted of 4.75 08/04/2022.   -Patient seen and followed by nephrology during the hospitalization.  See above.     Bilateral leg pain Unclear etiology. Generalized swelling. Tender. LE venous duplex negative for DVT.   Lymphedema of left arm Chronic. History of breast cancer and worsened secondary to IV fluids.   Generalized pain Chronic issue, although with abdominal pain, mental status change and ascites. Paracentesis ordered but unable to complete secondary to inadequate fluid. Mental status improved. Abdominal pain possibly related to presumed cancer. ---Due to patient's biopsy results of poorly differentiated carcinoma consistent with malignancy of GYN origin it was noted that patient did have advanced cancer and cancer directed treatment not recommended, patient seen by palliative care who discussed with family and decision made to transition to full comfort measures. -Patient will be discharged to residential hospice home. -Patient assessed by palliative care, placed on IV pain medication of Dilaudid initially and subsequently placed on a Dilaudid drip.   Abnormal thyroid level free T4 Elevated TSH (7.279) with associated elevated free T4 (1.48) in patient not on levothyroxine therapy. Consistent with possible secondary disease and TSH-mediated hyperthyroidism. T3 is low MRI without mention of pituitary abnormality. --Due to patient's biopsy results of poorly differentiated carcinoma consistent with malignancy of GYN origin it was noted that patient did have advanced cancer and cancer directed treatment not recommended, patient seen by palliative care who discussed with family and  decision made to transition to full comfort measures. -Patient will be discharged to residential hospice home.   History of seizures Secondary to alcohol withdrawal. Patient was managed on Keppra which was tapered off secondary to mental status change/lethargy. Depakote started in lieu of Keppra. -Patient maintained on Depakote. -No seizures noted during this hospitalization.   Chronic diastolic CHF (congestive heart failure) (HCC) -Wt currently 151.4kg -Initially noted to be volume depleted on admission, hydrated with IV fluids subsequently became grossly volume overloaded.   -Patient seen by nephrology and placed on Lasix 80 mg IV 3 times daily with good urine output.   -Due to patient's biopsy results of poorly differentiated carcinoma consistent with malignancy of GYN origin it was noted that patient did have advanced cancer and cancer directed treatment not recommended, patient seen by palliative care who discussed with family and decision made to transition to full comfort measures. -Patient will be discharged to residential hospice home.   Anemia in chronic kidney disease (CKD) Stable.   Morbid obesity with BMI of 50.0-59.9, adult [facility]) -Patient seen by palliative care and decision made to transition to full comfort measures.   Essential hypertension Patient noted to be on amlodipine and Coreg as an outpatient which were held during the hospitalization.  Blood pressure remained controlled.    Hypoglycemia-resolved as of 07/25/2022 On admission. Resolved.   Acute cystitis-resolved as of 07/25/2022 Urine culture significant for Klebsiella pneumoniae. Completed antibiotic course.  Hyperkalemia -Likely secondary to ARF. -Resolved  Procedures: Ultrasound-guided biopsy of abdominal pelvic mass by IR: Dr. Kathlene Cote 07/26/2022 EEG 07/19/2022 Pelvic exam under anesthesia, cervical biopsy, endocervical curettings, endometrial biopsy.  GYN oncology: Dr. Alfonse Spruce 07/20/2022 CT  head 07/18/2022 CT C-spine 07/18/2022 CT abdomen and pelvis 07/18/2022 CXR 07/18/2022 Plain films right ankle 07/18/2022 Plain films right knee 07/18/2022 MRI brain 07/18/2022 MRI pelvis 07/19/2022 Renal US 07/31/2022 ABd [place] 07/25/2022 2 d echo 07/31/2022 Lower extremity dopplers 07/26/2022    Consultations: Nephrology: Dr.Schertz 07/18/2022 GYN oncology: Dr. Ernestina Patches 07/20/2022 Interventional Radiology: Dr Vernard Gambles 07/21/2022 Palliative care: Dr. Hilma Favors 08/03/2022  Discharge Exam: Vitals:   08/03/22 1928 08/04/22 1506  BP: (!) 148/123 108/75  Pulse: (!) 106 (!) 125  Resp: 20 15  Temp: (!) 97.4 F (36.3 C) 98.4 F (36.9 C)  SpO2: 100% 100%    General: Comfortably resting. Cardiovascular: RRR no murmurs rubs or gallops. Respiratory: CTAB anterior lung fields.  No wheezes, no crackles, no rhonchi.  Discharge Instructions   Discharge Instructions     Diet general   Complete by: As directed    Comfort feeds   Increase activity slowly   Complete by: As directed    No wound care   Complete by: As directed       Allergies as of 08/04/2022       Reactions   Amlodipine Swelling, Rash   BLE edema        Medication List     STOP taking these medications    acamprosate 333 MG tablet Commonly known as: CAMPRAL   albuterol 108 (90 Base) MCG/ACT inhaler Commonly known as: VENTOLIN HFA Replaced by: albuterol (2.5 MG/3ML) 0.083% nebulizer solution   amLODipine 10 MG tablet Commonly known as: NORVASC   BIOTIN PO   carvedilol 12.5 MG tablet Commonly known as: COREG   folic acid 1 MG tablet Commonly known as: FOLVITE   furosemide 20 MG tablet Commonly known as: LASIX Replaced by: furosemide 10 MG/ML injection   gabapentin 300 MG capsule Commonly known as: NEURONTIN   levETIRAcetam 500 MG tablet Commonly known as: KEPPRA   multivitamin with minerals Tabs tablet   polyethylene glycol 17 g packet Commonly known as: MIRALAX / GLYCOLAX   traMADol 50  MG tablet Commonly known as: ULTRAM   traZODone 50 MG tablet Commonly known as: DESYREL       TAKE these medications    acetaminophen 325 MG tablet Commonly known as: TYLENOL Take 2 tablets (650 mg total) by mouth every 6 (six) hours as needed for mild pain (or Fever >/= 101).   albuterol (2.5 MG/3ML) 0.083% nebulizer solution Commonly known as: PROVENTIL Take 3 mLs (2.5 mg total) by nebulization every 4 (four) hours as needed for wheezing or shortness of breath. Replaces: albuterol 108 (90 Base) MCG/ACT inhaler   bisacodyl 10 MG suppository Commonly known as: DULCOLAX Place 1 suppository (10 mg total) rectally daily as needed for moderate constipation.   diazepam 5 MG/ML injection Commonly known as: VALIUM Inject 1 mL (5 mg total) into the vein every 8 (eight) hours.   divalproex 125 MG capsule Commonly known as: DEPAKOTE SPRINKLE Take 2 capsules (250 mg total) by mouth every 12 (twelve) hours.   furosemide 10 MG/ML injection Commonly known as: LASIX Inject 8 mLs (80 mg total) into the vein 3 (three) times daily. Replaces: furosemide 20 MG tablet   glycopyrrolate 1 MG tablet Commonly known as: ROBINUL Take 1 tablet (1 mg total) by mouth every 4 (four) hours as needed (excessive secretions).  HYDROmorphone 2 mg/mL Soln Commonly known as: DILAUDID Inject 1 mg into the vein every hour as needed (severe pain, dyspnea).   HYDROmorphone 50 mg in sodium chloride 0.9 % 45 mL Inject 0.5 mg/hr into the vein continuous.   LORazepam 1 MG tablet Commonly known as: ATIVAN Take 1 tablet (1 mg total) by mouth every 4 (four) hours as needed for anxiety.   LORazepam 2 MG/ML concentrated solution Commonly known as: ATIVAN Place 0.5 mLs (1 mg total) under the tongue every 4 (four) hours as needed for anxiety.   LORazepam 2 MG/ML injection Commonly known as: ATIVAN Inject 0.5 mLs (1 mg total) into the vein every 4 (four) hours as needed for anxiety.       Allergies   Allergen Reactions   Amlodipine Swelling and Rash    BLE edema    Follow-up Information     MD at [facility] Follow up.                   The results of significant diagnostics from this hospitalization (including imaging, microbiology, ancillary and laboratory) are listed below for reference.    Significant Diagnostic Studies: ECHOCARDIOGRAM COMPLETE  Result Date: 08/01/2022    ECHOCARDIOGRAM REPORT   Patient Name:   NAMIYAH GRANTHAM Date of Exam: 08/01/2022 Medical Rec #:  [id number]     Height:       64.0 in Accession #:    [id number]    Weight:       347.7 lb Date of Birth:  1956/04/29    BSA:          2.475 m Patient Age:    66 years      BP:           134/80 mmHg Patient Gender: F             HR:           104 bpm. Exam Location:  Inpatient Procedure: 2D Echo, Cardiac Doppler, Color Doppler and Intracardiac            Opacification Agent Indications:    CHF-Acute Diastolic P53.61  History:        Patient has prior history of Echocardiogram examinations, most                 recent 12/25/2020. Risk Factors:Hypertension. Chronic kidney                 disease. Anemia. Past history of breast cancer.  Sonographer:    Darlina Sicilian RDCS Referring Phys: [place]  1. Intraventricular obstruction occuring mid cavity with a peak gradient of 73mHg. .Marland KitchenLeft ventricular ejection fraction, by estimation, is >75%. The left ventricle has hyperdynamic function. The left ventricle has no regional wall motion abnormalities.  Left ventricular diastolic parameters are consistent with Grade I diastolic dysfunction (impaired relaxation).  2. Right ventricular systolic function is hyperdynamic. The right ventricular size is normal.  3. The mitral valve is grossly normal. No evidence of mitral valve regurgitation. No evidence of mitral stenosis.  4. The aortic valve is tricuspid. There is mild calcification of the aortic valve. Aortic valve regurgitation is not visualized. Aortic valve  sclerosis is present, with no evidence of aortic valve stenosis.  5. The inferior vena cava is normal in size with greater than 50% respiratory variability, suggesting right atrial pressure of 3 mmHg. Comparison(s): No significant change from prior study. Prior images reviewed side by side. FINDINGS  Left  Ventricle: Intraventricular obstruction occuring mid cavity with a peak gradient of 75mHg. Left ventricular ejection fraction, by estimation, is >75%. The left ventricle has hyperdynamic function. The left ventricle has no regional wall motion abnormalities. Definity contrast agent was given IV to delineate the left ventricular endocardial borders. The left ventricular internal cavity size was small. There is no left ventricular hypertrophy. Left ventricular diastolic parameters are consistent  with Grade I diastolic dysfunction (impaired relaxation). Right Ventricle: The right ventricular size is normal. Right vetricular wall thickness was not well visualized. Right ventricular systolic function is hyperdynamic. Left Atrium: Left atrial size was normal in size. Right Atrium: Right atrial size was normal in size. Pericardium: There is no evidence of pericardial effusion. Mitral Valve: The mitral valve is grossly normal. Mild mitral annular calcification. No evidence of mitral valve regurgitation. No evidence of mitral valve stenosis. Tricuspid Valve: The tricuspid valve is not well visualized. Tricuspid valve regurgitation is not demonstrated. No evidence of tricuspid stenosis. Aortic Valve: The aortic valve is tricuspid. There is mild calcification of the aortic valve. Aortic valve regurgitation is not visualized. Aortic valve sclerosis is present, with no evidence of aortic valve stenosis. Pulmonic Valve: The pulmonic valve was normal in structure. Pulmonic valve regurgitation is not visualized. No evidence of pulmonic stenosis. Aorta: The aortic root and ascending aorta are structurally normal, with no evidence  of dilitation. Venous: The inferior vena cava is normal in size with greater than 50% respiratory variability, suggesting right atrial pressure of 3 mmHg. IAS/Shunts: The interatrial septum was not well visualized.  LEFT VENTRICLE PLAX 2D LVIDd:         3.60 cm LVIDs:         2.00 cm LV PW:         0.80 cm LV IVS:        0.70 cm LVOT diam:     1.90 cm LV SV:         56 LV SV Index:   23 LVOT Area:     2.84 cm  LV Volumes (MOD) LV vol d, MOD A2C: 51.9 ml LV vol d, MOD A4C: 50.9 ml LV vol s, MOD A2C: 8.4 ml LV vol s, MOD A4C: 7.5 ml LV SV MOD A2C:     43.5 ml LV SV MOD A4C:     50.9 ml LV SV MOD BP:      43.2 ml LEFT ATRIUM             Index LA diam:        4.00 cm 1.62 cm/m LA Vol (A2C):   35.2 ml 14.22 ml/m LA Vol (A4C):   36.2 ml 14.63 ml/m LA Biplane Vol: 36.5 ml 14.75 ml/m  AORTIC VALVE LVOT Vmax:   124.00 cm/s LVOT Vmean:  86.600 cm/s LVOT VTI:    0.198 m  AORTA Ao Root diam: 2.60 cm Ao Asc diam:  3.40 cm MITRAL VALVE MV Area (PHT): 2.31 cm     SHUNTS MV Decel Time: 328 msec     Systemic VTI:  0.20 m MV E velocity: 87.50 cm/s   Systemic Diam: 1.90 cm MV A velocity: 143.00 cm/s MV E/A ratio:  0.61 MCandee FurbishMD Electronically signed by MCandee FurbishMD Signature Date/Time: 08/01/2022/10:42:27 AM    Final    [place]  Result Date: 07/31/2022 CLINICAL DATA:  Impaired renal function EXAM: RENAL / URINARY TRACT ULTRASOUND COMPLETE COMPARISON:  09/02/2021. FINDINGS: The examination was very limited technically due to body habitus  and the patient's ability to be positioned. Right kidney was 10.0 cm and left kidney was 11.5 cm. There is a shadowing midpole stone on the left. No hydronephrosis is identified. Foley catheter in place, and the empty urinary bladder was not evaluated. IMPRESSION: Limited examination technically. Shadowing stone left kidney. No hydronephrosis or renal parenchymal abnormalities were demonstrated. Electronically Signed   By: Sammie Bench M.D.   On: 07/31/2022 11:14   VAS [place] LOWER  EXTREMITY VENOUS (DVT)  Result Date: 07/26/2022  Lower Venous DVT Study Patient Name:  MIKAYLEE ARSENEAU  Date of Exam:   07/26/2022 Medical Rec #: [id number]      Accession #:    [id number] Date of Birth: 16-Jun-1956     Patient Gender: F Patient Age:   5 years Exam Location:  [facility] Procedure:      VAS [place] LOWER EXTREMITY VENOUS (DVT) Referring Phys: RALPH NETTEY --------------------------------------------------------------------------------  Indications: Pain.  Limitations: VERY difficult and limited exam due to body habitus (morbid obesity BMI 58), poor ultrasound/tissue interface, and inibilty to cooperate due to pain intolerance. Comparison Study: Previous LLEV exams on 02/10/20 and 11/04/18 were negative for                   DVT. Performing Technologist: Rogelia Rohrer RVT, RDMS  Examination Guidelines: A complete evaluation includes B-mode imaging, spectral Doppler, color Doppler, and power Doppler as needed of all accessible portions of each vessel. Bilateral testing is considered an integral part of a complete examination. Limited examinations for reoccurring indications may be performed as noted. The reflux portion of the exam is performed with the patient in reverse Trendelenburg.  +---------+---------------+---------+-----------+----------+-------------------+ RIGHT    CompressibilityPhasicitySpontaneityPropertiesThrombus Aging      +---------+---------------+---------+-----------+----------+-------------------+ CFV      Full           Yes      Yes                                      +---------+---------------+---------+-----------+----------+-------------------+ SFJ      Full                                                             +---------+---------------+---------+-----------+----------+-------------------+ FV Prox  Full           Yes      Yes                                      +---------+---------------+---------+-----------+----------+-------------------+  FV Mid   Full           Yes      Yes                  Not well visualized +---------+---------------+---------+-----------+----------+-------------------+ FV Distal               Yes      Yes                  Not well visualized +---------+---------------+---------+-----------+----------+-------------------+ PFV      Full                                                             +---------+---------------+---------+-----------+----------+-------------------+  POP      Full           Yes      Yes                  Not well visualized +---------+---------------+---------+-----------+----------+-------------------+ PTV                                                   Not visualized      +---------+---------------+---------+-----------+----------+-------------------+ PERO                                                  Not visualized      +---------+---------------+---------+-----------+----------+-------------------+   Right Technical Findings: Not visualized segments include peroneal and posterior tibial veins. FV distal patent by color and doppler only.  +---------+---------------+---------+-----------+----------+-------------------+ LEFT     CompressibilityPhasicitySpontaneityPropertiesThrombus Aging      +---------+---------------+---------+-----------+----------+-------------------+ CFV      Full                                                             +---------+---------------+---------+-----------+----------+-------------------+ SFJ      Full                                                             +---------+---------------+---------+-----------+----------+-------------------+ FV Prox  Full           Yes      Yes                                      +---------+---------------+---------+-----------+----------+-------------------+ FV Mid   Full           Yes      Yes                  Not well visualized  +---------+---------------+---------+-----------+----------+-------------------+ FV Distal               Yes      Yes                  Not well visualized +---------+---------------+---------+-----------+----------+-------------------+ PFV      Full                                                             +---------+---------------+---------+-----------+----------+-------------------+ POP      Full           Yes      Yes                  Not well visualized +---------+---------------+---------+-----------+----------+-------------------+ PTV  Not visualized      +---------+---------------+---------+-----------+----------+-------------------+ PERO                                                  Not visualized      +---------+---------------+---------+-----------+----------+-------------------+   Left Technical Findings: Not visualized segments include Peroneal ans posterior tibial veins. FV distal patent by color and doppler only.   Summary: BILATERAL: -No evidence of popliteal cyst, bilaterally. RIGHT: - There is no evidence of deep vein thrombosis in the lower extremity. However, portions of this examination were limited- see technologist comments above.  LEFT: - There is no evidence of deep vein thrombosis in the lower extremity. However, portions of this examination were limited- see technologist comments above.  *See table(s) above for measurements and observations. Electronically signed by Harold Barban MD on 07/26/2022 at 9:03:40 PM.    Final    CT ABDOMINAL MASS BIOPSY  Result Date: 07/26/2022 CLINICAL DATA:  Large pelvic mass extending into the lower abdomen. EXAM: CT GUIDED CORE BIOPSY OF PELVIC MASS ANESTHESIA/SEDATION: Moderate (conscious) sedation was employed during this procedure. A total of Versed 2.0 mg and Fentanyl 100 mcg was administered intravenously by radiology nursing. Moderate Sedation Time: 21  minutes. The patient's level of consciousness and vital signs were monitored continuously by radiology nursing throughout the procedure under my direct supervision. PROCEDURE: The procedure risks, benefits, and alternatives were explained to the patient. Questions regarding the procedure were encouraged and answered. The patient understands and consents to the procedure. A time-out was performed prior to initiating the procedure. CT was performed through the lower abdomen and pelvis in a supine position. The right lower abdominal wall was prepped with chlorhexidine in a sterile fashion, and a sterile drape was applied covering the operative field. A sterile gown and sterile gloves were used for the procedure. Local anesthesia was provided with 1% Lidocaine. Under CT guidance, a 17 gauge trocar needle was advanced into the right side of the pelvis from an anterior right-sided approach. After confirming needle tip position, several coaxial 18 gauge core biopsy samples were obtained and submitted in formalin. Prior to removing the outer trocar needle, a slurry of Gel-Foam pledgets was injected through the needle as it was retracted and removed. RADIATION DOSE REDUCTION: This exam was performed according to the departmental dose-optimization program which includes automated exposure control, adjustment of the mA and/or kV according to patient size and/or use of iterative reconstruction technique. COMPLICATIONS: None FINDINGS: Large pelvic mass is again noted which is more centered to the right of midline but also extends across the left of midline. Right-sided approach was taken for biopsy with core biopsy yielding fragments of solid tissue. IMPRESSION: CT-guided core biopsy performed of a large pelvic mass. Electronically Signed   By: Aletta Edouard M.D.   On: 07/26/2022 11:23   [place] ASCITES (ABDOMEN LIMITED)  Result Date: 07/25/2022 CLINICAL DATA:  Evaluate for paracentesis. EXAM: LIMITED ABDOMEN ULTRASOUND FOR  ASCITES TECHNIQUE: Limited ultrasound survey for ascites was performed in all four abdominal quadrants. COMPARISON:  CT abdomen 07/18/2022 and MRI pelvis 07/19/2022 FINDINGS: Small amount of fluid in the right upper quadrant near the right hemidiaphragm. No significant ascites in the right lower quadrant. Rounded fluid collection along the left side of the abdomen most likely associated with the pelvic mass/process. IMPRESSION: Small amount of free fluid in the right upper  quadrant of the abdomen. Paracentesis not performed due to the location of the fluid. Electronically Signed   By: Markus Daft M.D.   On: 07/25/2022 14:23   MR PELVIS WO CONTRAST  Result Date: 07/20/2022 CLINICAL DATA:  Evaluate adnexal mass. EXAM: MRI PELVIS WITHOUT CONTRAST TECHNIQUE: Multiplanar multisequence MR imaging of the pelvis was performed. No intravenous contrast was administered. COMPARISON:  CT AP 07/18/2022 FINDINGS: Urinary Tract: The urinary bladder is decompressed around a Foley catheter balloon. Bowel:  No dilated bowel loops identified. Vascular/Lymphatic: Normal caliber of the visualized abdominal aorta. No vascular abnormality. Multiple enlarged pelvic lymph nodes are identified, including: Right common iliac node measures 1.4 cm, image 8/17. Left common iliac lymph node measures 1.6 cm, image 29/17. Right external iliac lymph node measures 1.7 cm, image 29/17. Left inguinal lymph node measures 1.1 cm, image 37/17. Reproductive: Uterus: Measures 9.3 by 4.8 by 5.8 cm (volume = 140 cm^3) no discrete mass identified. Endometrium: The endometrium is diffusely thickened measuring up to 3.4 cm, image 24/9. The endometrium exhibits mild heterogeneous increased T2 signal and mild heterogeneous increased T1 signal. Right ovary: Not confidently identified. Left ovary: Not confidently identified. Other: Large mass within the pelvis is centered around the right adnexal region, crosses the midline and partially encases the uterus. This  measures 17.4 by 12.3 cm, image 21/17. Within the left lower quadrant there is a mass which measures 11.2 x 4.7 cm, image 26/17. Other: Moderate abdominopelvic ascites. Peritoneal nodularity is identified concerning for carcinomatosis. Index nodule within the ventral pelvis measures 1.9 x 1.4 cm, image 25/17. Within the left pelvis there is a peritoneal nodule measuring 3.7 x 2.5 cm, image 30/17. Musculoskeletal: No suspicious bone lesions identified. IMPRESSION: 1. Large mass measuring up to within the pelvis measures up to 17 cm, is centered around the right adnexal region, crosses the midline and partially encases the uterus. A second mass is noted within the left lower quadrant which measures up to 11.2 cm. Imaging findings are highly concerning for malignancy either ovarian or endometrium in origin. 2. There is marked thickening of the endometrium which measures up to 3.4 cm. Cannot rule out under lying endometrial neoplasm. 3. Moderate abdominopelvic ascites. 4. Peritoneal nodularity is identified concerning for carcinomatosis. 5. Multiple enlarged pelvic lymph nodes are identified compatible with metastatic adenopathy. Electronically Signed   By: Kerby Moors M.D.   On: 07/20/2022 10:05   EEG adult  Result Date: 07/19/2022 Lora Havens, MD     07/19/2022  1:40 PM Patient Name: Lyndia Bury MRN: [id number] Epilepsy Attending: Lora Havens Referring Physician/Provider: [name], MD Date: 07/19/2022 Duration: 22.43 mins Patient history: 66 year old female with altered mental status.  EEG to evaluate for seizure. Level of alertness: Awake AEDs during EEG study: LEV Technical aspects: This EEG study was done with scalp electrodes positioned according to the 10-20 International system of electrode placement. Electrical activity was reviewed with band pass filter of 1-'70Hz'$ , sensitivity of 7 uV/mm, display speed of 67m/sec with a '60Hz'$  notched filter applied as appropriate. EEG data were  recorded continuously and digitally stored.  Video monitoring was available and reviewed as appropriate. Description: No clear posterior dominant rhythm was seen. EEG showed continuous generalized predominantly 5 to 7 Hz theta slowing admixed with intermittent generalized 2 to 3 Hz delta slowing, at times with triphasic morphology.  Hyperventilation and photic stimulation were not performed.   ABNORMALITY - Continuous slow, generalized IMPRESSION: This study is suggestive of moderate diffuse encephalopathy, nonspecific etiology.  No seizures or definite epileptiform discharges were seen throughout the recording. Lora Havens   MR BRAIN WO CONTRAST  Result Date: 07/18/2022 CLINICAL DATA:  Neuro deficit with stroke suspected EXAM: MRI HEAD WITHOUT CONTRAST TECHNIQUE: Multiplanar, multiecho pulse sequences of the brain and surrounding structures were obtained without intravenous contrast. COMPARISON:  Head CT from earlier today FINDINGS: Brain: Remote bilateral occipital cortex infarcts with cortical laminar necrosis and mineralization seen on T1 weighted imaging. No acute or subacute infarct, hemorrhage, hydrocephalus, mass, or collection. Vascular: Major flow voids are preserved. Skull and upper cervical spine: Normal marrow signal. Sinuses/Orbits: Negative. Other: Progressively motion degraded study. IMPRESSION: 1. No acute finding. 2. Remote occipital cortex infarct. Electronically Signed   By: Jorje Guild M.D.   On: 07/18/2022 11:58   US PELVIC COMPLETE WITH TRANSVAGINAL  Result Date: 07/18/2022 CLINICAL DATA:  66 year old female inpatient with unenhanced CT findings worrisome for gyn malignancy and peritoneal carcinomatosis. EXAM: TRANSABDOMINAL AND TRANSVAGINAL ULTRASOUND OF PELVIS TECHNIQUE: Both transabdominal and transvaginal ultrasound examinations of the pelvis were performed. Transabdominal technique was performed for global imaging of the pelvis including uterus, ovaries, adnexal regions,  and pelvic cul-de-sac. It was necessary to proceed with endovaginal exam following the transabdominal exam to visualize the endometrium and adnexa. COMPARISON:  07/18/2022 CT abdomen/pelvis. FINDINGS: This inpatient ultrasound examination is essentially nondiagnostic due to patient related factors including morbid obesity, inability for the patient to move due to pain, and bowel gas. On the limited transabdominal images (in correlation with the unenhanced CT images from earlier today), there is a very large infiltrative pelvic soft tissue mass involving/encasing the right greater than left adnexal regions and encasing and obscuring the uterus, very poorly delineated on this ultrasound, spanning up to 14.0 x 12.2 x 19.9 cm. The ovaries are not discretely visualized on either side. The uterus including the endometrium is completely indistinct on these images. Other findings There is small volume complex free fluid in the pelvic cul-de-sac. IMPRESSION: 1. Essentially nondiagnostic inpatient pelvic ultrasound examination due to patient related factors as detailed. 2. Suspected very large infiltrative pelvic soft tissue mass involving the right greater than left adnexal regions and encasing and obscuring the uterus, spanning up to 14.0 x 12.2 x 19.9 cm. Findings are very suspicious for a primary GYN malignancy, with the differential including primary ovarian malignancy or primary endometrial malignancy. MRI pelvis without and with IV contrast recommended for further characterization. CT abdomen/pelvis with oral and IV contrast also recommended for further characterization. GYN-ONC consultation advised. 3. Small volume complex free fluid in the pelvic cul-de-sac. Electronically Signed   By: Ilona Sorrel M.D.   On: 07/18/2022 11:47   CT ABDOMEN PELVIS WO CONTRAST  Result Date: 07/18/2022 CLINICAL DATA:  Fall, abdominal pain, renal failure EXAM: CT ABDOMEN AND PELVIS WITHOUT CONTRAST TECHNIQUE: Multidetector CT imaging  of the abdomen and pelvis was performed following the standard protocol without IV contrast. RADIATION DOSE REDUCTION: This exam was performed according to the departmental dose-optimization program which includes automated exposure control, adjustment of the mA and/or kV according to patient size and/or use of iterative reconstruction technique. COMPARISON:  CT pelvis dated 06/10/2021. CT abdomen/pelvis dated 03/07/2020. FINDINGS: Motion degraded images. Lower chest: Lung bases are clear. Hepatobiliary: Unenhanced liver is unremarkable. Gallbladder is unremarkable. No intrahepatic or extrahepatic duct dilatation. Pancreas: Within normal limits. Spleen: Within normal limits. Adrenals/Urinary Tract: Adrenal glands are within normal limits. Small parenchymal calcification in the posterior left lower kidney (series 4/image 31). Right kidney is within normal  limits. No renal calculi or hydronephrosis. Bladder is within normal limits. Stomach/Bowel: Stomach is within normal limits. Bowel is partially obscured but grossly unremarkable. No evidence of bowel obstruction. Appendix is not discretely visualized. Vascular/Lymphatic: No evidence of abdominal aortic aneurysm. Atherosclerotic calcifications of the abdominal aorta and branch vessels. No suspicious abdominopelvic lymphadenopathy. Reproductive: Uterus and bilateral ovaries are obscured. There may be fluid within a dilated central uterine cavity (sagittal image 103), but this is poorly evaluated on the current study. Other: Mild perihepatic and perisplenic ascites, simple. Moderate pelvic fluid/hemorrhage (series 4/image 58), masslike, with possible omental nodularity (series 4/image 49). Musculoskeletal: Degenerative changes of the visualized thoracolumbar spine. IMPRESSION: Motion degraded images. Moderate complex pelvic fluid/hemorrhage, as above. Hemorrhage related to trauma is possible but not favored. Rather, the appearance is worrisome for underlying (GYN)  malignancy, although obscured on this noncontrast evaluation. Consider CT abdomen/pelvis with oral/IV contrast for further evaluation, if clinically able. Moderate Electronically Signed   By: Julian Hy M.D.   On: 07/18/2022 01:06   CT Head Wo Contrast  Result Date: 07/18/2022 CLINICAL DATA:  Memory loss, lethargy, recent falls. ETOH withdrawal EXAM: CT HEAD WITHOUT CONTRAST CT CERVICAL SPINE WITHOUT CONTRAST TECHNIQUE: Multidetector CT imaging of the head and cervical spine was performed following the standard protocol without intravenous contrast. Multiplanar CT image reconstructions of the cervical spine were also generated. RADIATION DOSE REDUCTION: This exam was performed according to the departmental dose-optimization program which includes automated exposure control, adjustment of the mA and/or kV according to patient size and/or use of iterative reconstruction technique. COMPARISON:  MRI brain dated 06/27/2022. FINDINGS: CT HEAD FINDINGS Brain: No evidence of acute infarction, hemorrhage, hydrocephalus, extra-axial collection or mass lesion/mass effect. Bilateral PCA distribution infarcts. Mild subcortical white matter and periventricular small vessel ischemic changes. Vascular: Intracranial atherosclerosis. Skull: Normal. Negative for fracture or focal lesion. Sinuses/Orbits: The visualized paranasal sinuses are essentially clear. The mastoid air cells are unopacified. Other: None. CT CERVICAL SPINE FINDINGS Alignment: Straightening of the cervical spine, likely positional. Skull base and vertebrae: No acute fracture. No primary bone lesion or focal pathologic process. Benign bone island at C7. Soft tissues and spinal canal: No prevertebral fluid or swelling. No visible canal hematoma. Disc levels:  Mild degenerative changes of the mid cervical spine. Upper chest: Visualized lung apices are clear. Other: Visualized thyroid is unremarkable. IMPRESSION: No evidence of acute intracranial  abnormality. Bilateral PCA distribution infarcts. Mild small vessel ischemic changes. No traumatic injury to the cervical spine. Mild degenerative changes. Electronically Signed   By: Julian Hy M.D.   On: 07/18/2022 00:46   CT Cervical Spine Wo Contrast  Result Date: 07/18/2022 CLINICAL DATA:  Memory loss, lethargy, recent falls. ETOH withdrawal EXAM: CT HEAD WITHOUT CONTRAST CT CERVICAL SPINE WITHOUT CONTRAST TECHNIQUE: Multidetector CT imaging of the head and cervical spine was performed following the standard protocol without intravenous contrast. Multiplanar CT image reconstructions of the cervical spine were also generated. RADIATION DOSE REDUCTION: This exam was performed according to the departmental dose-optimization program which includes automated exposure control, adjustment of the mA and/or kV according to patient size and/or use of iterative reconstruction technique. COMPARISON:  MRI brain dated 06/27/2022. FINDINGS: CT HEAD FINDINGS Brain: No evidence of acute infarction, hemorrhage, hydrocephalus, extra-axial collection or mass lesion/mass effect. Bilateral PCA distribution infarcts. Mild subcortical white matter and periventricular small vessel ischemic changes. Vascular: Intracranial atherosclerosis. Skull: Normal. Negative for fracture or focal lesion. Sinuses/Orbits: The visualized paranasal sinuses are essentially clear. The mastoid air cells are  unopacified. Other: None. CT CERVICAL SPINE FINDINGS Alignment: Straightening of the cervical spine, likely positional. Skull base and vertebrae: No acute fracture. No primary bone lesion or focal pathologic process. Benign bone island at C7. Soft tissues and spinal canal: No prevertebral fluid or swelling. No visible canal hematoma. Disc levels:  Mild degenerative changes of the mid cervical spine. Upper chest: Visualized lung apices are clear. Other: Visualized thyroid is unremarkable. IMPRESSION: No evidence of acute intracranial  abnormality. Bilateral PCA distribution infarcts. Mild small vessel ischemic changes. No traumatic injury to the cervical spine. Mild degenerative changes. Electronically Signed   By: Julian Hy M.D.   On: 07/18/2022 00:46   DG Knee Complete 4 Views Right  Result Date: 07/18/2022 CLINICAL DATA:  Altered mental status.  Pain. EXAM: RIGHT KNEE - COMPLETE 4+ VIEW COMPARISON:  None Available. FINDINGS: Examination is limited secondary to body habitus and technique. There is no definite acute fracture or dislocation. There is severe medial compartment joint space narrowing with osteophyte formation. There is mild patellofemoral joint space narrowing with osteophyte formation. Soft tissues are grossly within normal limits. IMPRESSION: 1. No definite acute fracture or dislocation. 2. 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 Electronically Signed   By: Ronney Asters M.D.   On: 07/18/2022 00:12   DG Chest Port 1 View  Result Date: 07/18/2022 CLINICAL DATA:  Hypoxia EXAM: PORTABLE CHEST 1 VIEW COMPARISON:  06/23/2022 FINDINGS: Cardiomegaly. Lungs clear. No effusions or edema. No acute bony abnormality. Right chest wall Port-A-Cath remains in place with the tip at the cavoatrial junction, unchanged. IMPRESSION: Cardiomegaly.  No active disease. Electronically Signed   By: Rolm Baptise M.D.   On: 07/18/2022 00:11   DG Ankle Complete Right  Result Date: 07/18/2022 CLINICAL DATA:  Altered mental status, pain, recent falls EXAM: RIGHT ANKLE - COMPLETE 3+ VIEW COMPARISON:  None Available. FINDINGS: No acute bony abnormality. Specifically, no fracture, subluxation, or dislocation. Soft tissues intact. IMPRESSION: No acute bony abnormality. Electronically Signed   By: Rolm Baptise M.D.   On: 07/18/2022 00:10   DG Hip Unilat W or Wo Pelvis 2-3 Views Right  Result Date: 07/18/2022 CLINICAL DATA:  Altered mental status.  Pain. EXAM: DG HIP (WITH OR WITHOUT PELVIS) 2-3V  RIGHT COMPARISON:  None Available. FINDINGS: There is no evidence of hip fracture or dislocation. There is no evidence of arthropathy or other focal bone abnormality. IMPRESSION: Negative. Electronically Signed   By: Ronney Asters M.D.   On: 07/18/2022 00:08    Microbiology: No results found for this or any previous visit (from the past 240 hour(s)).   Labs: Basic Metabolic Panel: Recent Labs  Lab 07/31/22 0713 08/01/22 0448 08/02/22 0206 08/03/22 0238 08/04/22 0326  NA 138 138 137 138 138  K 5.1 5.7* 4.6 4.5 5.0  CL 107 108 108 106 108  CO2 21* 19* 20* 19* 19*  GLUCOSE 87 80 83 91 79  BUN 54* 53* 53* 55* 55*  CREATININE 4.35* 4.51* 4.33* 4.17* 4.75*  CALCIUM 8.9 8.8* 8.5* 8.6* 8.7*  PHOS  --   --   --   --  5.4*   Liver Function Tests: Recent Labs  Lab 07/30/22 0500 07/31/22 0713 08/01/22 0448 08/02/22 0206 08/03/22 0238 08/04/22 0326  AST '19 19 22 21 22  '$ --   ALT <5 '5 6 5 '$ <5  --   ALKPHOS 45 47 51 45 52  --   BILITOT 0.4 0.4 0.3 0.4 0.6  --  PROT 7.2 7.2 7.5 6.9 7.3  --   ALBUMIN 2.4* 2.9* 2.7* 2.4* 2.4* 2.5*   No results for input(s): "LIPASE", "AMYLASE" in the last 168 hours. No results for input(s): "AMMONIA" in the last 168 hours. CBC: Recent Labs  Lab 07/29/22 0506 07/30/22 0500 07/31/22 0713 08/02/22 0206 08/03/22 0238  WBC 10.7* 11.3* 11.8* 13.2* 13.1*  HGB 9.0* 9.1* 8.5* 8.6* 8.8*  HCT 29.5* 30.1* 28.1* 28.1* 28.1*  MCV 94.9 97.4 95.9 93.7 92.1  PLT 419* 457* 475* 444* 471*   Cardiac Enzymes: No results for input(s): "CKTOTAL", "CKMB", "CKMBINDEX", "TROPONINI" in the last 168 hours. BNP: BNP (last 3 results) Recent Labs    07/18/22 0440  BNP 63.1    ProBNP (last 3 results) No results for input(s): "PROBNP" in the last 8760 hours.  CBG: Recent Labs  Lab 08/03/22 1700 08/03/22 1925 08/04/22 0739 08/04/22 1120 08/04/22 1602  GLUCAP 101* 80 75 67* 78       Signed:  Irine Seal MD.  [facility] 08/04/2022, 5:54  PM

## 2022-08-04 NOTE — Progress Notes (Addendum)
Manufacturing engineer [facility]) Hospital Liaison Note  Referral received for patient/family interest in [facility]. Chart under review by [place].   Hospice eligibility confirmed.   Bed offered and accepted for transfer today to [facility]. Unit RN please call report to [phone] prior to patient leaving the unit. Please send signed DNR and paperwork with patient.   Please schedule transport to [facility] for a 8 pm pickup. PTAR [phone]  Please leave all IV access and ports in place.   Call with any questions or concerns. Thank you  Roselee Nova, [facility] Liaison [phone]

## 2022-08-04 NOTE — TOC Transition Note (Addendum)
Transition of Care [facility]) - CM/SW Discharge Note   Patient Details  Name: [name] MRN: [id number] Date of Birth: [birth year]  Transition of Care [facility]) CM/SW Contact:  Henrietta Dine, RN Phone Number: 08/04/2022, 5:35 PM   Clinical Narrative:    Pt received bed at [facility]; spoke w/ Pam at facility; she gave assignment RM#[id number] and call report # [phone]; contacted pt's son Delsa Bern ([phone]) and he agrees to pt transfer; called [facility] '@[phone]  and spoke w/ Dreama; arranged for 8pm pickup; Dr Irine Seal notified; awaiting d/c summary; no TOC needs.   Final next level of care: [place] Barriers to Discharge: Continued Medical Work up   Patient Goals and CMS Choice        Discharge Placement                       Discharge Plan and Services                                     Social Determinants of Health (SDOH) Interventions     Readmission Risk Interventions    07/19/2022    9:41 AM 09/07/2021   11:26 AM 03/06/2020   10:03 AM  Readmission Risk Prevention Plan  Transportation Screening Complete Complete Complete  PCP or Specialist Appt within 3-5 Days   Not Complete  Not Complete comments   Unsure of dc date at this time  [place] or [place]   Complete  Social Work Consult for [place] Planning/Counseling   Complete  Palliative Care Screening   Not Applicable  Medication Review Press photographer) Complete Complete Complete  PCP or Specialist appointment within 3-5 days of discharge Complete Complete   HRI or Home Care Consult Complete Complete   SW Recovery Care/Counseling Consult Complete Complete   Palliative Care Screening Not Applicable Not [place] Not Applicable Complete

## 2022-08-04 NOTE — TOC Progression Note (Signed)
Transition of Care [facility]) - Progression Note    Patient Details  Name: [name] MRN: [id number] Date of Birth: 19-Dec-1955  [facility]) CM/SW Contact  Leeroy Cha, RN Phone Number: 08/04/2022, 2:40 PM  Clinical Narrative:     Referral through Nicholaus Corolla made for [facility].  Expected Discharge Plan: [place] Barriers to Discharge: Continued Medical Work up  Expected Discharge Plan and Services Expected Discharge Plan: [place]                                               Social Determinants of Health (SDOH) Interventions    Readmission Risk Interventions   Row Labels 07/19/2022    9:41 AM 09/07/2021   11:26 AM 03/06/2020   10:03 AM  Readmission Risk Prevention Plan   Section Header. No data exists in this row.     Transportation Screening   Complete Complete Complete  PCP or Specialist Appt within 3-5 Days     Not Complete  Not Complete comments     Unsure of dc date at this time  [place] or [place]     Complete  Social Work Consult for [place] Planning/Counseling     Complete  Palliative Care Screening     Not Applicable  Medication Review Press photographer)   Complete Complete Complete  PCP or Specialist appointment within 3-5 days of discharge   Complete Complete   HRI or Home Care Consult   Complete Complete   SW Recovery Care/Counseling Consult   Complete Complete   Palliative Care Screening   Not Applicable Not Applicable   [place]   Not Applicable Complete

## 2022-08-04 NOTE — Progress Notes (Signed)
OT Cancellation Note  Patient Details Name: [name] MRN: [id number] DOB: 11-Sep-1955   Cancelled Treatment:    Reason Eval/Treat Not Completed: Other (comment). Per RN patient has been made comfort care and plan is for Hospice house. OT will sign off.   Sadira Standard L Rashana Andrew 08/04/2022, 9:42 AM

## 2022-08-13 ENCOUNTER — Other Ambulatory Visit: Payer: Self-pay | Admitting: Family Medicine

## 2022-08-13 DIAGNOSIS — Z1231 Encounter for screening mammogram for malignant neoplasm of breast: Secondary | ICD-10-CM

## 2022-08-30 DEATH — deceased

## 2022-09-07 ENCOUNTER — Encounter: Payer: Self-pay | Admitting: Oncology

## 2022-12-02 ENCOUNTER — Other Ambulatory Visit: Payer: Self-pay | Admitting: Physician Assistant

## 2023-01-13 ENCOUNTER — Encounter: Payer: Self-pay | Admitting: Oncology

## 2023-01-14 ENCOUNTER — Encounter: Payer: Self-pay | Admitting: Oncology
